# Patient Record
Sex: Female | Born: 1940
Health system: Southern US, Community
[De-identification: ages and names within clinical notes are randomized; demographics above are authoritative.]

## PROBLEM LIST (undated history)

## (undated) DIAGNOSIS — G894 Chronic pain syndrome: Secondary | ICD-10-CM

## (undated) DIAGNOSIS — F3289 Other specified depressive episodes: Secondary | ICD-10-CM

## (undated) DIAGNOSIS — M797 Fibromyalgia: Secondary | ICD-10-CM

## (undated) DIAGNOSIS — M810 Age-related osteoporosis without current pathological fracture: Secondary | ICD-10-CM

## (undated) DIAGNOSIS — Z86018 Personal history of other benign neoplasm: Secondary | ICD-10-CM

## (undated) DIAGNOSIS — O039 Complete or unspecified spontaneous abortion without complication: Secondary | ICD-10-CM

## (undated) DIAGNOSIS — R233 Spontaneous ecchymoses: Secondary | ICD-10-CM

## (undated) DIAGNOSIS — K219 Gastro-esophageal reflux disease without esophagitis: Secondary | ICD-10-CM

## (undated) DIAGNOSIS — E1142 Type 2 diabetes mellitus with diabetic polyneuropathy: Secondary | ICD-10-CM

## (undated) DIAGNOSIS — J449 Chronic obstructive pulmonary disease, unspecified: Secondary | ICD-10-CM

## (undated) DIAGNOSIS — G4700 Insomnia, unspecified: Secondary | ICD-10-CM

## (undated) DIAGNOSIS — E1159 Type 2 diabetes mellitus with other circulatory complications: Secondary | ICD-10-CM

## (undated) DIAGNOSIS — G610 Guillain-Barre syndrome: Secondary | ICD-10-CM

## (undated) DIAGNOSIS — M48061 Spinal stenosis, lumbar region without neurogenic claudication: Secondary | ICD-10-CM

## (undated) DIAGNOSIS — I10 Essential (primary) hypertension: Secondary | ICD-10-CM

## (undated) DIAGNOSIS — E039 Hypothyroidism, unspecified: Secondary | ICD-10-CM

## (undated) DIAGNOSIS — N3090 Cystitis, unspecified without hematuria: Secondary | ICD-10-CM

## (undated) DIAGNOSIS — K449 Diaphragmatic hernia without obstruction or gangrene: Secondary | ICD-10-CM

## (undated) DIAGNOSIS — J301 Allergic rhinitis due to pollen: Secondary | ICD-10-CM

## (undated) DIAGNOSIS — J42 Unspecified chronic bronchitis: Secondary | ICD-10-CM

## (undated) DIAGNOSIS — R269 Unspecified abnormalities of gait and mobility: Secondary | ICD-10-CM

## (undated) DIAGNOSIS — F329 Major depressive disorder, single episode, unspecified: Secondary | ICD-10-CM

## (undated) DIAGNOSIS — E119 Type 2 diabetes mellitus without complications: Secondary | ICD-10-CM

## (undated) DIAGNOSIS — M069 Rheumatoid arthritis, unspecified: Secondary | ICD-10-CM

## (undated) DIAGNOSIS — E782 Mixed hyperlipidemia: Secondary | ICD-10-CM

## (undated) DIAGNOSIS — J189 Pneumonia, unspecified organism: Secondary | ICD-10-CM

## (undated) DIAGNOSIS — E785 Hyperlipidemia, unspecified: Secondary | ICD-10-CM

## (undated) DIAGNOSIS — M47817 Spondylosis without myelopathy or radiculopathy, lumbosacral region: Secondary | ICD-10-CM

## (undated) DIAGNOSIS — R32 Unspecified urinary incontinence: Secondary | ICD-10-CM

## (undated) DIAGNOSIS — L299 Pruritus, unspecified: Secondary | ICD-10-CM

## (undated) HISTORY — DX: Unspecified chronic bronchitis: J42

## (undated) HISTORY — DX: Allergic rhinitis due to pollen: J30.1

## (undated) HISTORY — DX: Guillain-Barre syndrome: G61.0

## (undated) HISTORY — DX: Pruritus, unspecified: L29.9

## (undated) HISTORY — DX: Spontaneous ecchymoses: R23.3

## (undated) HISTORY — DX: Major depressive disorder, single episode, unspecified: F32.9

## (undated) HISTORY — DX: Other specified depressive episodes: F32.89

## (undated) HISTORY — DX: Insomnia, unspecified: G47.00

## (undated) HISTORY — DX: Personal history of other benign neoplasm: Z86.018

## (undated) HISTORY — DX: Unspecified urinary incontinence: R32

## (undated) HISTORY — PX: TONSILLECTOMY: SUR1361

## (undated) HISTORY — DX: Morbid (severe) obesity due to excess calories: E66.01

## (undated) HISTORY — DX: Spinal stenosis, lumbar region without neurogenic claudication: M48.061

## (undated) HISTORY — PX: APPENDECTOMY: SHX54

## (undated) HISTORY — DX: Complete or unspecified spontaneous abortion without complication: O03.9

## (undated) HISTORY — DX: Type 2 diabetes mellitus with diabetic polyneuropathy: E11.42

## (undated) HISTORY — PX: KNEE SURGERY: SHX244

## (undated) HISTORY — DX: Spondylosis without myelopathy or radiculopathy, lumbosacral region: M47.817

## (undated) HISTORY — DX: Essential (primary) hypertension: I10

## (undated) HISTORY — DX: Hypothyroidism, unspecified: E03.9

## (undated) HISTORY — DX: Type 2 diabetes mellitus without complications: E11.9

## (undated) HISTORY — DX: Chronic pain syndrome: G89.4

## (undated) HISTORY — DX: Age-related osteoporosis without current pathological fracture: M81.0

## (undated) HISTORY — DX: Rheumatoid arthritis, unspecified: M06.9

## (undated) HISTORY — DX: Hyperlipidemia, unspecified: E78.5

## (undated) HISTORY — DX: Gastro-esophageal reflux disease without esophagitis: K21.9

## (undated) HISTORY — PX: CATARACT EXTRACTION, BILATERAL: SHX1313

## (undated) HISTORY — DX: Cystitis, unspecified without hematuria: N30.90

## (undated) HISTORY — DX: Unspecified abnormalities of gait and mobility: R26.9

## (undated) HISTORY — PX: OTHER SURGICAL HISTORY: SHX169

## (undated) HISTORY — DX: Fibromyalgia: M79.7

## (undated) HISTORY — DX: Diaphragmatic hernia without obstruction or gangrene: K44.9

## (undated) HISTORY — DX: Type 2 diabetes mellitus with other circulatory complications: E11.59

## (undated) HISTORY — DX: Mixed hyperlipidemia: E78.2

---

## 1960-08-22 DIAGNOSIS — O039 Complete or unspecified spontaneous abortion without complication: Secondary | ICD-10-CM

## 1960-08-22 HISTORY — DX: Complete or unspecified spontaneous abortion without complication: O03.9

## 1960-08-22 HISTORY — PX: OTHER SURGICAL HISTORY: SHX169

## 1966-08-22 HISTORY — PX: OVARIAN CYST SURGERY: SHX726

## 1972-08-22 HISTORY — PX: ABDOMINAL HYSTERECTOMY: SHX81

## 1980-08-22 HISTORY — PX: BACK SURGERY: SHX140

## 1982-08-22 HISTORY — PX: CHOLECYSTECTOMY: SHX55

## 1999-06-06 ENCOUNTER — Emergency Department (HOSPITAL_COMMUNITY): Admission: EM | Admit: 1999-06-06 | Discharge: 1999-06-07 | Payer: Self-pay

## 1999-08-23 HISTORY — PX: BRONCHOSCOPY: SUR163

## 2000-06-16 ENCOUNTER — Encounter: Admission: RE | Admit: 2000-06-16 | Discharge: 2000-06-16 | Payer: Self-pay | Admitting: Family Medicine

## 2000-06-16 ENCOUNTER — Encounter: Payer: Self-pay | Admitting: Family Medicine

## 2000-06-20 ENCOUNTER — Encounter: Admission: RE | Admit: 2000-06-20 | Discharge: 2000-06-20 | Payer: Self-pay | Admitting: Family Medicine

## 2000-06-20 ENCOUNTER — Encounter: Payer: Self-pay | Admitting: Family Medicine

## 2000-06-29 ENCOUNTER — Encounter: Payer: Self-pay | Admitting: Emergency Medicine

## 2000-06-29 ENCOUNTER — Encounter (INDEPENDENT_AMBULATORY_CARE_PROVIDER_SITE_OTHER): Payer: Self-pay | Admitting: *Deleted

## 2000-06-29 ENCOUNTER — Encounter (INDEPENDENT_AMBULATORY_CARE_PROVIDER_SITE_OTHER): Payer: Self-pay | Admitting: Specialist

## 2000-06-29 ENCOUNTER — Encounter: Payer: Self-pay | Admitting: Internal Medicine

## 2000-06-29 ENCOUNTER — Inpatient Hospital Stay (HOSPITAL_COMMUNITY): Admission: EM | Admit: 2000-06-29 | Discharge: 2000-07-08 | Payer: Self-pay | Admitting: Emergency Medicine

## 2000-06-30 ENCOUNTER — Encounter: Payer: Self-pay | Admitting: Internal Medicine

## 2000-06-30 ENCOUNTER — Encounter: Payer: Self-pay | Admitting: Thoracic Surgery (Cardiothoracic Vascular Surgery)

## 2000-07-01 ENCOUNTER — Encounter: Payer: Self-pay | Admitting: Family Medicine

## 2000-07-02 ENCOUNTER — Encounter: Payer: Self-pay | Admitting: Internal Medicine

## 2000-07-03 ENCOUNTER — Encounter: Payer: Self-pay | Admitting: Family Medicine

## 2000-07-04 ENCOUNTER — Encounter: Payer: Self-pay | Admitting: *Deleted

## 2000-07-04 ENCOUNTER — Encounter: Payer: Self-pay | Admitting: Thoracic Surgery (Cardiothoracic Vascular Surgery)

## 2000-07-19 ENCOUNTER — Encounter: Payer: Self-pay | Admitting: Neurology

## 2000-07-19 ENCOUNTER — Inpatient Hospital Stay (HOSPITAL_COMMUNITY): Admission: AD | Admit: 2000-07-19 | Discharge: 2000-07-27 | Payer: Self-pay | Admitting: Neurology

## 2000-07-25 ENCOUNTER — Encounter: Payer: Self-pay | Admitting: Neurology

## 2000-07-27 ENCOUNTER — Inpatient Hospital Stay (HOSPITAL_COMMUNITY)
Admission: RE | Admit: 2000-07-27 | Discharge: 2000-08-02 | Payer: Self-pay | Admitting: Physical Medicine and Rehabilitation

## 2000-08-10 ENCOUNTER — Encounter
Admission: RE | Admit: 2000-08-10 | Discharge: 2000-11-08 | Payer: Self-pay | Admitting: Physical Medicine and Rehabilitation

## 2000-08-22 DIAGNOSIS — G610 Guillain-Barre syndrome: Secondary | ICD-10-CM

## 2000-08-22 HISTORY — PX: OTHER SURGICAL HISTORY: SHX169

## 2000-08-22 HISTORY — DX: Guillain-Barre syndrome: G61.0

## 2000-08-23 ENCOUNTER — Encounter: Admission: RE | Admit: 2000-08-23 | Discharge: 2000-08-23 | Payer: Self-pay | Admitting: Family Medicine

## 2000-08-23 ENCOUNTER — Encounter: Payer: Self-pay | Admitting: Family Medicine

## 2000-08-28 ENCOUNTER — Encounter: Admission: RE | Admit: 2000-08-28 | Discharge: 2000-08-28 | Payer: Self-pay | Admitting: Family Medicine

## 2000-08-28 ENCOUNTER — Encounter: Payer: Self-pay | Admitting: Family Medicine

## 2000-09-19 ENCOUNTER — Encounter (INDEPENDENT_AMBULATORY_CARE_PROVIDER_SITE_OTHER): Payer: Self-pay

## 2000-09-19 ENCOUNTER — Inpatient Hospital Stay (HOSPITAL_COMMUNITY): Admission: RE | Admit: 2000-09-19 | Discharge: 2000-09-22 | Payer: Self-pay | Admitting: *Deleted

## 2000-10-20 HISTORY — PX: OTHER SURGICAL HISTORY: SHX169

## 2000-11-09 ENCOUNTER — Encounter: Payer: Self-pay | Admitting: Thoracic Surgery (Cardiothoracic Vascular Surgery)

## 2000-11-13 ENCOUNTER — Inpatient Hospital Stay (HOSPITAL_COMMUNITY)
Admission: RE | Admit: 2000-11-13 | Discharge: 2000-11-17 | Payer: Self-pay | Admitting: Thoracic Surgery (Cardiothoracic Vascular Surgery)

## 2000-11-13 ENCOUNTER — Encounter (INDEPENDENT_AMBULATORY_CARE_PROVIDER_SITE_OTHER): Payer: Self-pay | Admitting: Specialist

## 2000-11-13 ENCOUNTER — Encounter: Payer: Self-pay | Admitting: Thoracic Surgery (Cardiothoracic Vascular Surgery)

## 2000-11-14 ENCOUNTER — Encounter: Payer: Self-pay | Admitting: Thoracic Surgery (Cardiothoracic Vascular Surgery)

## 2000-11-14 ENCOUNTER — Encounter: Payer: Self-pay | Admitting: Thoracic Surgery

## 2000-11-16 ENCOUNTER — Encounter: Payer: Self-pay | Admitting: Thoracic Surgery (Cardiothoracic Vascular Surgery)

## 2000-11-17 ENCOUNTER — Encounter: Payer: Self-pay | Admitting: Thoracic Surgery (Cardiothoracic Vascular Surgery)

## 2000-11-29 ENCOUNTER — Encounter: Payer: Self-pay | Admitting: Thoracic Surgery (Cardiothoracic Vascular Surgery)

## 2000-11-29 ENCOUNTER — Encounter
Admission: RE | Admit: 2000-11-29 | Discharge: 2000-11-29 | Payer: Self-pay | Admitting: Thoracic Surgery (Cardiothoracic Vascular Surgery)

## 2001-08-27 ENCOUNTER — Encounter: Admission: RE | Admit: 2001-08-27 | Discharge: 2001-11-25 | Payer: Self-pay | Admitting: Family Medicine

## 2002-05-13 ENCOUNTER — Encounter: Payer: Self-pay | Admitting: Family Medicine

## 2002-05-13 ENCOUNTER — Encounter: Admission: RE | Admit: 2002-05-13 | Discharge: 2002-05-13 | Payer: Self-pay | Admitting: Family Medicine

## 2004-03-11 ENCOUNTER — Encounter: Admission: RE | Admit: 2004-03-11 | Discharge: 2004-03-11 | Payer: Self-pay | Admitting: Family Medicine

## 2004-05-24 ENCOUNTER — Encounter
Admission: RE | Admit: 2004-05-24 | Discharge: 2004-05-24 | Payer: Self-pay | Admitting: Thoracic Surgery (Cardiothoracic Vascular Surgery)

## 2004-06-22 ENCOUNTER — Ambulatory Visit (HOSPITAL_BASED_OUTPATIENT_CLINIC_OR_DEPARTMENT_OTHER): Admission: RE | Admit: 2004-06-22 | Discharge: 2004-06-22 | Payer: Self-pay | Admitting: Family Medicine

## 2004-06-28 ENCOUNTER — Ambulatory Visit: Payer: Self-pay

## 2004-08-10 ENCOUNTER — Encounter: Admission: RE | Admit: 2004-08-10 | Discharge: 2004-08-10 | Payer: Self-pay | Admitting: Family Medicine

## 2004-11-03 ENCOUNTER — Encounter: Admission: RE | Admit: 2004-11-03 | Discharge: 2004-11-03 | Payer: Self-pay | Admitting: Family Medicine

## 2004-12-15 ENCOUNTER — Ambulatory Visit: Payer: Self-pay | Admitting: Internal Medicine

## 2004-12-17 ENCOUNTER — Ambulatory Visit (HOSPITAL_COMMUNITY): Admission: RE | Admit: 2004-12-17 | Discharge: 2004-12-17 | Payer: Self-pay | Admitting: Internal Medicine

## 2004-12-17 ENCOUNTER — Ambulatory Visit: Payer: Self-pay | Admitting: Internal Medicine

## 2004-12-17 ENCOUNTER — Encounter (INDEPENDENT_AMBULATORY_CARE_PROVIDER_SITE_OTHER): Payer: Self-pay | Admitting: Specialist

## 2005-01-21 ENCOUNTER — Encounter
Admission: RE | Admit: 2005-01-21 | Discharge: 2005-01-21 | Payer: Self-pay | Admitting: Thoracic Surgery (Cardiothoracic Vascular Surgery)

## 2005-08-24 ENCOUNTER — Encounter: Admission: RE | Admit: 2005-08-24 | Discharge: 2005-08-24 | Payer: Self-pay | Admitting: Family Medicine

## 2005-11-22 ENCOUNTER — Encounter: Admission: RE | Admit: 2005-11-22 | Discharge: 2005-11-22 | Payer: Self-pay | Admitting: Family Medicine

## 2006-02-16 ENCOUNTER — Encounter
Admission: RE | Admit: 2006-02-16 | Discharge: 2006-02-16 | Payer: Self-pay | Admitting: Thoracic Surgery (Cardiothoracic Vascular Surgery)

## 2006-02-23 ENCOUNTER — Encounter: Admission: RE | Admit: 2006-02-23 | Discharge: 2006-02-23 | Payer: Self-pay | Admitting: Family Medicine

## 2006-04-17 ENCOUNTER — Encounter: Admission: RE | Admit: 2006-04-17 | Discharge: 2006-04-17 | Payer: Self-pay | Admitting: Otolaryngology

## 2006-07-12 ENCOUNTER — Ambulatory Visit: Payer: Self-pay | Admitting: Cardiology

## 2006-07-14 ENCOUNTER — Ambulatory Visit: Payer: Self-pay

## 2006-07-17 ENCOUNTER — Ambulatory Visit: Payer: Self-pay

## 2008-04-17 ENCOUNTER — Encounter: Admission: RE | Admit: 2008-04-17 | Discharge: 2008-04-17 | Payer: Self-pay | Admitting: Family Medicine

## 2009-07-29 ENCOUNTER — Encounter: Admission: RE | Admit: 2009-07-29 | Discharge: 2009-07-29 | Payer: Self-pay | Admitting: Family Medicine

## 2009-10-02 ENCOUNTER — Encounter: Admission: RE | Admit: 2009-10-02 | Discharge: 2009-10-02 | Payer: Self-pay | Admitting: Family Medicine

## 2009-10-05 ENCOUNTER — Ambulatory Visit: Payer: Self-pay | Admitting: Oncology

## 2009-10-30 LAB — CBC WITH DIFFERENTIAL/PLATELET
BASO%: 0.6 % (ref 0.0–2.0)
Basophils Absolute: 0.1 10*3/uL (ref 0.0–0.1)
EOS%: 2 % (ref 0.0–7.0)
Eosinophils Absolute: 0.3 10*3/uL (ref 0.0–0.5)
HCT: 42.5 % (ref 34.8–46.6)
HGB: 14.2 g/dL (ref 11.6–15.9)
LYMPH%: 21 % (ref 14.0–49.7)
MCH: 29.2 pg (ref 25.1–34.0)
MCHC: 33.4 g/dL (ref 31.5–36.0)
MCV: 87.3 fL (ref 79.5–101.0)
MONO#: 1.4 10*3/uL — ABNORMAL HIGH (ref 0.1–0.9)
MONO%: 9.7 % (ref 0.0–14.0)
NEUT#: 9.4 10*3/uL — ABNORMAL HIGH (ref 1.5–6.5)
NEUT%: 66.7 % (ref 38.4–76.8)
Platelets: 322 10*3/uL (ref 145–400)
RBC: 4.87 10*6/uL (ref 3.70–5.45)
RDW: 14.3 % (ref 11.2–14.5)
WBC: 14.1 10*3/uL — ABNORMAL HIGH (ref 3.9–10.3)
lymph#: 3 10*3/uL (ref 0.9–3.3)

## 2009-10-30 LAB — CHCC SMEAR

## 2010-01-27 ENCOUNTER — Encounter: Admission: RE | Admit: 2010-01-27 | Discharge: 2010-01-27 | Payer: Self-pay | Admitting: Family Medicine

## 2010-04-05 ENCOUNTER — Encounter: Payer: Self-pay | Admitting: Cardiology

## 2010-04-29 ENCOUNTER — Ambulatory Visit: Payer: Self-pay | Admitting: Oncology

## 2010-05-03 DIAGNOSIS — J309 Allergic rhinitis, unspecified: Secondary | ICD-10-CM | POA: Insufficient documentation

## 2010-05-03 DIAGNOSIS — E785 Hyperlipidemia, unspecified: Secondary | ICD-10-CM | POA: Insufficient documentation

## 2010-05-03 DIAGNOSIS — Z8669 Personal history of other diseases of the nervous system and sense organs: Secondary | ICD-10-CM | POA: Insufficient documentation

## 2010-05-03 DIAGNOSIS — B379 Candidiasis, unspecified: Secondary | ICD-10-CM | POA: Insufficient documentation

## 2010-05-03 DIAGNOSIS — M797 Fibromyalgia: Secondary | ICD-10-CM | POA: Insufficient documentation

## 2010-05-03 DIAGNOSIS — K219 Gastro-esophageal reflux disease without esophagitis: Secondary | ICD-10-CM | POA: Insufficient documentation

## 2010-05-03 DIAGNOSIS — I10 Essential (primary) hypertension: Secondary | ICD-10-CM | POA: Insufficient documentation

## 2010-05-03 DIAGNOSIS — G610 Guillain-Barre syndrome: Secondary | ICD-10-CM | POA: Insufficient documentation

## 2010-05-03 DIAGNOSIS — F3289 Other specified depressive episodes: Secondary | ICD-10-CM | POA: Insufficient documentation

## 2010-05-03 DIAGNOSIS — J189 Pneumonia, unspecified organism: Secondary | ICD-10-CM | POA: Insufficient documentation

## 2010-05-03 DIAGNOSIS — E119 Type 2 diabetes mellitus without complications: Secondary | ICD-10-CM | POA: Insufficient documentation

## 2010-05-03 DIAGNOSIS — R0602 Shortness of breath: Secondary | ICD-10-CM | POA: Insufficient documentation

## 2010-05-03 DIAGNOSIS — J449 Chronic obstructive pulmonary disease, unspecified: Secondary | ICD-10-CM | POA: Insufficient documentation

## 2010-05-03 DIAGNOSIS — R079 Chest pain, unspecified: Secondary | ICD-10-CM | POA: Insufficient documentation

## 2010-05-03 DIAGNOSIS — F329 Major depressive disorder, single episode, unspecified: Secondary | ICD-10-CM

## 2010-05-03 DIAGNOSIS — F339 Major depressive disorder, recurrent, unspecified: Secondary | ICD-10-CM | POA: Insufficient documentation

## 2010-05-04 ENCOUNTER — Ambulatory Visit: Payer: Self-pay | Admitting: Cardiology

## 2010-05-04 DIAGNOSIS — R609 Edema, unspecified: Secondary | ICD-10-CM | POA: Insufficient documentation

## 2010-05-06 LAB — CBC WITH DIFFERENTIAL/PLATELET
BASO%: 0.5 % (ref 0.0–2.0)
Basophils Absolute: 0.1 10*3/uL (ref 0.0–0.1)
EOS%: 3 % (ref 0.0–7.0)
Eosinophils Absolute: 0.4 10*3/uL (ref 0.0–0.5)
HCT: 42.1 % (ref 34.8–46.6)
HGB: 13.4 g/dL (ref 11.6–15.9)
LYMPH%: 16.9 % (ref 14.0–49.7)
MCH: 27.7 pg (ref 25.1–34.0)
MCHC: 31.8 g/dL (ref 31.5–36.0)
MCV: 87.2 fL (ref 79.5–101.0)
MONO#: 0.9 10*3/uL (ref 0.1–0.9)
MONO%: 7.9 % (ref 0.0–14.0)
NEUT#: 8.5 10*3/uL — ABNORMAL HIGH (ref 1.5–6.5)
NEUT%: 71.7 % (ref 38.4–76.8)
Platelets: 266 10*3/uL (ref 145–400)
RBC: 4.83 10*6/uL (ref 3.70–5.45)
RDW: 14.3 % (ref 11.2–14.5)
WBC: 11.8 10*3/uL — ABNORMAL HIGH (ref 3.9–10.3)
lymph#: 2 10*3/uL (ref 0.9–3.3)

## 2010-05-13 ENCOUNTER — Telehealth (INDEPENDENT_AMBULATORY_CARE_PROVIDER_SITE_OTHER): Payer: Self-pay | Admitting: *Deleted

## 2010-05-17 ENCOUNTER — Telehealth (INDEPENDENT_AMBULATORY_CARE_PROVIDER_SITE_OTHER): Payer: Self-pay | Admitting: *Deleted

## 2010-05-18 ENCOUNTER — Ambulatory Visit: Payer: Self-pay

## 2010-05-18 ENCOUNTER — Ambulatory Visit: Payer: Self-pay | Admitting: Cardiology

## 2010-05-18 ENCOUNTER — Encounter: Payer: Self-pay | Admitting: Internal Medicine

## 2010-05-18 ENCOUNTER — Encounter: Payer: Self-pay | Admitting: Cardiology

## 2010-05-18 ENCOUNTER — Ambulatory Visit (HOSPITAL_COMMUNITY): Admission: RE | Admit: 2010-05-18 | Discharge: 2010-05-18 | Payer: Self-pay | Admitting: Cardiology

## 2010-05-18 ENCOUNTER — Encounter (HOSPITAL_COMMUNITY)
Admission: RE | Admit: 2010-05-18 | Discharge: 2010-07-26 | Payer: Self-pay | Source: Home / Self Care | Admitting: Cardiology

## 2010-05-20 ENCOUNTER — Ambulatory Visit: Payer: Self-pay

## 2010-06-01 ENCOUNTER — Encounter (INDEPENDENT_AMBULATORY_CARE_PROVIDER_SITE_OTHER): Payer: Self-pay | Admitting: *Deleted

## 2010-06-04 ENCOUNTER — Encounter: Admission: RE | Admit: 2010-06-04 | Discharge: 2010-06-04 | Payer: Self-pay | Admitting: Family Medicine

## 2010-06-04 ENCOUNTER — Ambulatory Visit (HOSPITAL_COMMUNITY): Admission: RE | Admit: 2010-06-04 | Discharge: 2010-06-04 | Payer: Self-pay | Admitting: Rheumatology

## 2010-06-20 ENCOUNTER — Encounter: Admission: RE | Admit: 2010-06-20 | Discharge: 2010-06-20 | Payer: Self-pay | Admitting: Specialist

## 2010-09-21 NOTE — Assessment & Plan Note (Signed)
Summary: np6/dx: dypsnea upon excertion/lg   History of Present Illness: 70 year old female her evaluation of dyspnea, chest tightness and edema. Previous Myoview in November 2007 showed an ejection fraction of 81% and no ischemia or infarction. The patient has a long history of dyspnea on exertion. It has worsened recently. There is orthopnea which is chronic as well as pedal edema. The edema is new over the past year. It is relatively well controlled with diuretics. She also has chest tightness with exertion. This has also been somewhat chronic. It increases with deep inspiration. The cause of her dyspnea, chest tightness and edema we were asked to further evaluate.  Current Medications (verified): 1)  Novolog 100 Unit/ml Soln (Insulin Aspart) .Marland Kitchen.. 10 Untis Every Meal 2)  Lantus 100 Unit/ml Soln (Insulin Glargine) .... 70 Units Qhs 3)  Actos 45 Mg Tabs (Pioglitazone Hcl) .Marland Kitchen.. 1 Tab By Mouth Once Daily 4)  Lisinopril 40 Mg Tabs (Lisinopril) .... Take One Tablet By Mouth Daily 5)  Furosemide 40 Mg Tabs (Furosemide) .... Take One Tablet By Mouth Daily. 6)  Potassium Chloride Cr 10 Meq Cr-Caps (Potassium Chloride) .... 2 Tabs By Mouth Once Daily 7)  Amitriptyline Hcl 25 Mg Tabs (Amitriptyline Hcl) .Marland Kitchen.. 1 Tab By Mouth Once Daily 8)  Levoxyl 137 Mcg Tabs (Levothyroxine Sodium) .Marland Kitchen.. 1  Tab By Mouth Once Daily 9)  Triamterene-Hctz 75-50 Mg Tabs (Triamterene-Hctz) .... 1/2 Tab By Mouth Once Daily 10)  Clonidine Hcl 0.1 Mg Tabs (Clonidine Hcl) .Marland Kitchen.. 1 Tab By Mouth Once Daily 11)  Hydrocodone-Acetaminophen 10-500 Mg Tabs (Hydrocodone-Acetaminophen) .... As Directed As Needed 12)  Crestor 10 Mg Tabs (Rosuvastatin Calcium) .... Take One Tablet By Mouth Daily. 13)  Metformin Hcl 1000 Mg Tabs (Metformin Hcl) .Marland Kitchen.. 1 Tab By Mouth  Two Times A Day 14)  Vitamin D3 5000 Unit Tabs (Cholecalciferol) .Marland Kitchen.. 1 Tab By Mouth Once Daily 15)  Gabapentin 300 Mg Caps (Gabapentin) .Marland Kitchen.. 1 Tab By Mouth Three Times A Day 16)   Spiriva Handihaler 18 Mcg Caps (Tiotropium Bromide Monohydrate) .... As Directed 17)  Qvar 80 Mcg/act Aers (Beclomethasone Dipropionate) .... As  Directed 18)  Voltaren 0.1 % Soln (Diclofenac Sodium) .... As Directed 19)  Doxycycline Hyclate 100 Mg Caps (Doxycycline Hyclate) .Marland Kitchen.. 1 Tab By Mouth Once Daily  Past History:  Past Medical History: HYPERLIPIDEMIA  HYPERTENSION  FIBROMYALGIA  DEPRESSION COPD ALLERGIC RHINITIS GERD  OBESITY  GUILLAIN-BARRE SYNDROME  DIABETES MELLITUS Rheumatoid arthritis Hypothyroid  Past Surgical History:  1. Ovarian cyst resected.  2. Hysterectomy.  3. Back surgery from herniated disc.  4. Cholecystectomy.  5. Abdominal tumors and ovary resected.  6. Thymus surgery.  7. Appendectomy  8. Tonsillectomy  Family History: Reviewed history from 05/03/2010 and no changes required. Father with CHF. No premature CAD.  Social History: Reviewed history from 05/03/2010 and no changes required. The patient is a widow.  She has two adult sons.  She is retired.   Smoked previously.  Review of Systems       Significant arthralgias but no fevers or chills, productive cough, hemoptysis, dysphasia, odynophagia, melena, hematochezia, dysuria, hematuria, rash, seizure activity,  PND,  claudication. Remaining systems are negative.   Vital Signs:  Patient profile:   70 year old female Height:      66 inches Weight:      307 pounds BMI:     49.73 Pulse rate:   101 / minute Resp:     14 per minute BP sitting:   141 / 71  (  left arm)  Vitals Entered By: Kem Parkinson (May 04, 2010 11:13 AM)  Physical Exam  General:  Well developed/morbidly obese in NAD Skin warm/dry Patient not depressed No peripheral clubbing Back-normal HEENT-normal/normal eyelids Neck supple/normal carotid upstroke bilaterally; no bruits; no JVD; no thyromegaly chest - CTA/ normal expansion CV - RRR/normal S1 and S2; no murmurs, rubs or gallops;  PMI  nondisplaced Abdomen -NT/ND, no HSM, no mass, + bowel sounds, no bruit 2+ femoral pulses, no bruits Ext-no edema, chords, 2+ DP Neuro-grossly nonfocal     EKG  Procedure date:  05/04/2010  Findings:      Sinus tachycardia at a rate of 101. No ST changes.  Impression & Recommendations:  Problem # 1:  DYSPNEA (ICD-786.05) Etiology unclear. Schedule echocardiogram to evaluate LV function. Scheduled Myoview to exclude ischemia. Certainly there could be a component of obesity hypoventilation syndrome and deconditioning. Her updated medication list for this problem includes:    Lisinopril 40 Mg Tabs (Lisinopril) .Marland Kitchen... Take one tablet by mouth daily    Furosemide 40 Mg Tabs (Furosemide) .Marland Kitchen... Take one tablet by mouth daily.    Triamterene-hctz 75-50 Mg Tabs (Triamterene-hctz) .Marland Kitchen... 1/2 tab by mouth once daily  Orders: Echocardiogram (Echo)  Problem # 2:  HYPERLIPIDEMIA (ICD-272.4) Continue statin. Lipids and liver monitored by primary care. Her updated medication list for this problem includes:    Crestor 10 Mg Tabs (Rosuvastatin calcium) .Marland Kitchen... Take one tablet by mouth daily.  Problem # 3:  CHEST PAIN (ICD-786.50) Multiple risk factors. Schedule Myoview for risk stratification. Her updated medication list for this problem includes:    Lisinopril 40 Mg Tabs (Lisinopril) .Marland Kitchen... Take one tablet by mouth daily  Orders: Nuclear Stress Test (Nuc Stress Test)  Problem # 4:  HYPERTENSION (ICD-401.9) Continue blood pressure medications. Potassium and renal function monitored by primary care. Her updated medication list for this problem includes:    Lisinopril 40 Mg Tabs (Lisinopril) .Marland Kitchen... Take one tablet by mouth daily    Furosemide 40 Mg Tabs (Furosemide) .Marland Kitchen... Take one tablet by mouth daily.    Triamterene-hctz 75-50 Mg Tabs (Triamterene-hctz) .Marland Kitchen... 1/2 tab by mouth once daily    Clonidine Hcl 0.1 Mg Tabs (Clonidine hcl) .Marland Kitchen... 1 tab by mouth once daily  Problem # 5:  OBESITY  (ICD-278.00) Long discussion concerning the importance of weight loss.  Problem # 6:  DIABETES MELLITUS (ICD-250.00)  Her updated medication list for this problem includes:    Novolog 100 Unit/ml Soln (Insulin aspart) .Marland KitchenMarland KitchenMarland KitchenMarland Kitchen 10 untis every meal    Lantus 100 Unit/ml Soln (Insulin glargine) .Marland KitchenMarland KitchenMarland KitchenMarland Kitchen 70 units qhs    Actos 45 Mg Tabs (Pioglitazone hcl) .Marland Kitchen... 1 tab by mouth once daily    Lisinopril 40 Mg Tabs (Lisinopril) .Marland Kitchen... Take one tablet by mouth daily    Metformin Hcl 1000 Mg Tabs (Metformin hcl) .Marland Kitchen... 1 tab by mouth  two times a day  Problem # 7:  EDEMA (ICD-782.3) No edema on examination today. Continue diuretics. Await echocardiogram results. She certainly could have RV dysfunction related to pulmonary hypertension from her obesity.  Patient Instructions: 1)  Your physician recommends that you schedule a follow-up appointment in: AS NEEDED PENDING TEST RESULTS 2)  Your physician has requested that you have an echocardiogram.  Echocardiography is a painless test that uses sound waves to create images of your heart. It provides your doctor with information about the size and shape of your heart and how well your heart's chambers and valves are working.  This procedure  takes approximately one hour. There are no restrictions for this procedure. 3)  Your physician has requested that you have an LEXISCAN myoview.  For further information please visit [email or web address]  Please follow instruction sheet, as given.

## 2010-09-21 NOTE — Progress Notes (Signed)
  Phone Note From Other Clinic   Caller: Melissa/Dr.Stone Initial call taken by: KM    Faxed LOV,12 lead over to [phone] [facility]  May 13, 2010 8:43 AM

## 2010-09-21 NOTE — Assessment & Plan Note (Signed)
Summary: Cardiology Nuclear Testing  Nuclear Med Background Indications for Stress Test: Evaluation for Ischemia   History: COPD, Myocardial Perfusion Study  History Comments: 07 MPS-NL with no ischemia  Symptoms: Chest Pain, Chest Tightness with Exertion, Diaphoresis, DOE, Fatigue, Palpitations, SOB    Nuclear Pre-Procedure Cardiac Risk Factors: Hypertension, IDDM Type 2, Lipids Caffeine/Decaff Intake: None NPO After: 11:30 PM Lungs: clear IV 0.9% NS with Angio Cath: 22g     IV Site: R Antecubital IV Started by: Irean Hong, RN Chest Size (in) 44     Cup Size DDD     Height (in): 66 Weight (lb): 307 BMI: 49.73  Nuclear Med Study 1 or 2 day study:  2 day     Stress Test Type:  Treadmill/Lexiscan Reading MD:  Olga Millers, MD     Referring MD:  B.Gaege Sangalang Resting Radionuclide:  Technetium 16m Tetrofosmin     Resting Radionuclide Dose:  33 mCi  Stress Radionuclide:  Technetium 62m Tetrofosmin     Stress Radionuclide Dose:  33 mCi   Stress Protocol      Max HR:  148 bpm     Predicted Max HR:  151 bpm  Max Systolic BP: 162 mm Hg     Percent Max HR:  98.01 %Rate Pressure Product:  16109    Stress Test Technologist:  Frederick Peers, EMT-P     Nuclear Technologist:  Domenic Polite, CNMT  Rest Procedure  Myocardial perfusion imaging was performed at rest 45 minutes following the intravenous administration of Technetium 41m Tetrofosmin.  Stress Procedure  The patient received IV Lexiscan 0.4 mg over 15-seconds with concurrent low level exercise and then Technetium 56m Tetrofosmin was injected at 30-seconds while the patient continued walking one more minute.  There were no significant changes with Lexiscan./occ pvc's beginning of recovery stage, resolved within the next min.  Quantitative spect images were obtained after a 45 minute delay.  QPS Raw Data Images:  Acquisition technically good; normal left ventricular size. Stress Images:  Normal homogeneous uptake in all areas  of the myocardium. Rest Images:  Normal homogeneous uptake in all areas of the myocardium. Subtraction (SDS):  No evidence of ischemia. Transient Ischemic Dilatation:  .87  (Normal <1.22)  Lung/Heart Ratio:  .35  (Normal <0.45)  Quantitative Gated Spect Images QGS EDV:  70 ml QGS ESV:  15 ml QGS EF:  79 % QGS cine images:  Normal wall motion.   Overall Impression  Exercise Capacity: Lexiscan with no exercise. BP Response: Normal blood pressure response. Clinical Symptoms: No chest pain ECG Impression: No significant ST segment change suggestive of ischemia. Overall Impression: Normal lexiscan nuclear study with no ischemia or infarction.  Appended Document: Cardiology Nuclear Testing pt aware of results    Appended Document: Cardiology Nuclear Testing ok  Appended Document: Cardiology Nuclear Testing lmtcb ./cy  Appended Document: Cardiology Nuclear Testing LMTCB./CY  Appended Document: Cardiology Nuclear Testing number disconnected, letter of results sent to pt

## 2010-09-21 NOTE — Letter (Signed)
Summary: Generic Letter  Architectural technologist, Main Office  [address]   [place], [place] [address]   Phone: [phone]  Fax: [phone]        June 01, 2010 MRN: [id number]    [facility] [place] [address] [place], [place]  [address]    Dear Ms. Gentry Roch,          The stress test you had in our office has come back normal. That means the blood flow into the heart is normal and your heart pumping function is strong. Please call with questions or concerns.      Sincerely,  Deliah Goody, RN/Dr Olga Millers

## 2010-09-21 NOTE — Letter (Signed)
Summary: Family Medicine - Med List  Family Medicine - Med List   Imported By: Marylou Mccoy 05/18/2010 12:48:14  _____________________________________________________________________  External Attachment:    Type:   Image     Comment:   External Document

## 2010-09-21 NOTE — Letter (Signed)
Summary: Family Medicine  Family Medicine   Imported By: Marylou Mccoy 05/18/2010 12:47:41  _____________________________________________________________________  External Attachment:    Type:   Image     Comment:   External Document

## 2010-09-21 NOTE — Progress Notes (Signed)
Summary: nuc pre procedure  Phone Note Outgoing Call Call back at Home Phone [phone]   Call placed by: Cathlyn Parsons RN,  May 17, 2010 3:53 PM Call placed to: Patient Reason for Call: Confirm/change Appt Summary of Call: Left message with information on Myoview Information Sheet (see scanned document for details).      Nuclear Med Background Indications for Stress Test: Evaluation for Ischemia   History: COPD, Myocardial Perfusion Study  History Comments: 07 MPS-NL with no ischemia  Symptoms: Chest Pain, Chest Tightness with Exertion, DOE, SOB    Nuclear Pre-Procedure Cardiac Risk Factors: Hypertension, IDDM Type 2, Lipids Height (in): 66

## 2011-01-07 NOTE — Discharge Summary (Signed)
[place] [place]  Patient:    Katherine Delgado, Katherine Delgado                        MRN: [id number] Adm. Date:  [id number] Disc. Date: [id number] Attending:  Charlett Lango Dictator:   Maxwell Marion, RNFA CC:         Talmadge Coventry, M.D.  Charlaine Dalton. Sherene Sires, M.D. [facility]  Jillyn Hidden B. Truett Perna, M.D.  Kelli Hope, M.D.   Discharge Summary  DATE OF BIRTH:  01/10/1941  ADMISSION DIAGNOSIS:  Thymoma.  PAST MEDICAL HISTORY:  1. Hypertension.  2. Diabetes mellitus.  3. History of Guillain-Barre syndrome.  4. Status post IVIG treatment.  5. Gastroesophageal reflux disease symptoms.  6. Allergic rhinitis.  7. Recent history of pneumonia.  8. Diabetic neuropathy.  9. History of leukocytosis with fever of unknown origin in November 2001. 10. Chronic obstructive pulmonary disease. 11. Depression. 12. Fibromyalgia. 13. Obesity.  PAST SURGICAL HISTORY: 1. Cholecystectomy in 1984. 2. Back surgery in 1982. 3. Hysterectomy in 1974. 4. Flexible bronchoscopy and mediastinoscopy in November 2001. 5. Excision of cystic pelvic mass in January 2002.  ALLERGIES:  PENICILLIN, BACTRIM.  DISCHARGE DIAGNOSES: 1. 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 2. Postoperative hypokalemia, resolved.  HOSPITAL COURSE AND PROCEDURES:  Katherine Delgado is a pleasant 70 year old white female referred to Dr. Dorris Fetch by Dr. Smith Mince for evaluation of thymoma. Around November 2001, she began experiencing cough and right-sided chest pain. A chest x-ray reveals possible lung mass versus pneumonia.  A CT of the chest revealed mediastinal lymphadenopathy and possible anterior mediastinal mass. Dr. Dorris Fetch performed a bronchoscopy and mediastinoscopy.  Mediastinoscopy revealed enlarged level 7 lymph nodes and interior cervical nodes.  The lymph nodes were negative, showing only hyperplasia.  The interior mediastinal mass was finally tissue with hyperplasia versus thymoma.   She was subsequently readmitted to the hospital with Guillain-Barre syndrome, from which she has now recovered.  She was scheduled for resection of thymoma after recuperating from this.  Unfortunately, a new CT scan of the chest and abdomen in January revealed a 7 cm cystic pelvic mass.  She underwent surgery to remove this at [facility] in January 2002.  She has recovered from this surgery, and was re-evaluated by Dr. Dorris Fetch in his office, and decision was made to proceed with resection of the thymic mass.  On November 13, 2000, Katherine Delgado was electively admitted to [facility] and Dr. Edwina Barth, and underwent an uncomplicated partial mediansternotomy and thymectomy. Preliminary pathology at the time of surgery revealed thymic hyperplasia.  At the conclusion of the procedure she was transferred in stable condition to the PACU.  Katherine Delgado postoperative course has been uneventful.  Final pathology has been returned as benign thymic hyperplasia with benign thymic cyst.  Katherine Delgado is making very good progress towards discharge, and Dr. Dorris Fetch anticipates her ready to be discharged home tomorrow, November 17, 2000.  CONDITION ON DISCHARGE:  Improved.  DISCHARGE INSTRUCTIONS:  Medications, diet, activity, wound care, and followup.  Please see discharge instruction sheet for details.  DISCHARGE MEDICATIONS:  Resume her home medications which include, Glucotrol 10 mg p.o. b.i.d., Glucophage 500 mg p.o. b.i.d., Diozide 37.5/25 p.o. q.d., Protonix 40 mg p.o. q.d., Neurontin 300 mg p.o. t.i.d., hydrocodone 5/500 q.4-6h. p.r.n. pain relief, OxyContin CR 20 mg p.o. b.i.d., Clarinex 5 mg p.o. q.d., Ambien p.r.n. at bedtime.  FOLLOWUP:  Katherine Delgado has a follow-up appointment to see  Dr. Dorris Fetch at the [facility] office on Wednesday, April 10, and she has been instructed to have a chest x-ray taken at [facility] approximately one hour prior to that appointment. DD:   11/16/00 TD:  11/18/00 Job: [id number]

## 2011-01-07 NOTE — Consult Note (Signed)
[place]  Patient:    Katherine Delgado, Katherine Delgado                        MRN: [id number] Proc. Date: 07/05/00 Adm. Date:  [id number] Attending:  Charlett Lango                          Consultation Report  DATE OF BIRTH:  [birth year]  REFERRING PHYSICIAN:  Salvatore Decent. Dorris Fetch, M.D.  REASON FOR CONSULTATION:  Possible myasthenia gravis.  HISTORY OF PRESENT ILLNESS:  This is the initial inpatient consultation evaluation of this 70 year old female with a past medical history of hypertension, diabetes, and fibromyalgia who approximately 3 weeks ago reported acute onset of malaise, generalized fatigue without particular weakness, and some shortness of breath. She underwent a workup at that time, including a chest x-ray which revealed a questionable round pneumonia versus mediastinal adenopathy. This was followed up by a chest CT which revealed a mediastinal adenopathy and an anterior mediastinal mass. She was seen as an outpatient for this by Salvatore Decent. Dorris Fetch, M.D. and was scheduled to have mediastinoscopy with biopsy on November 12 for further evaluation. In the interval, she was apparently placed on steroids. Of note, she has a long history of a mild leukocytosis which is thought to be related to smoking. On November 8, she presented to the emergency room with generalized pain, nausea and vomiting. She became febrile and hypotensive several hours after admission. She was transferred to the ICU and briefly required dopamine. She was placed on antibiotics and subsequently all of this cleared. No infectious source was ever identified. She has been back on the floor for a few days and has remained stable, afebrile, with a decreasing white count. She underwent mediastinoscopy with biopsy of the anterior mediastinal mass yesterday, and this revealed normal ______ tissue. Concern has subsequently been raised over a lymphoma and whether or not her  symptoms may be related to myasthenia gravis. On specific questioning, she does report she has had some blurred vision which she attributes to elevated blood sugar due to the steroids. She denies any diplopia, dysarthria, dysphagia, jaw fatigue, or focal weakness. She has had some dyspnea on exertion.  PAST MEDICAL HISTORY:  Diabetes for 1 year as above, also hypertension, fibromyalgia, and gastroesophageal reflux disease.  PAST SURGICAL HISTORY:  She has had a cholecystectomy and back surgery in the past.  FAMILY HISTORY:  Mother died at 61 of diabetes and heart disease. Father died at 61 of alcoholism and heart disease. She has a sister and three brothers who are alive and well.  SOCIAL HISTORY:  She smokes about two cigarettes a day but has smoked up to a half of a pack a day in the past. She denies alcohol use. She lives in [place] with her husband.  REVIEW OF SYSTEMS:  GENERAL:  Negative. EYES:  Blurred vision as above. Denies diplopia. ENT:  Reports some "enlarged lymph nodes" on the right side of her neck with the onset of the illness which has now resolved. RESPIRATORY: Shortness of breath as above. CV:  Negative. GI:  Nausea and vomiting as above. GU:  Negative.  MEDICATIONS:  Insulin, Lasix, KCl, hydrocortisone, Ensure, estradiol, Maxzide, Protonix, Serevent, Glucotrol, Effexor, albuterol and Atrovent nebulizers.  PHYSICAL EXAMINATION:  VITAL SIGNS:  Temperature 97.5, blood pressure 155/75, pulse 70, respirations 20.  GENERAL:  She is alert and in no evident distress.  NECK:  Supple without bruits.  CHEST:  Clear to auscultation.  HEART:  Regular rate and rhythm without murmurs.  NEUROLOGICAL:  She is completely oriented. Her speech is normal in tone and content. Cranial nerves:  The funduscopic exam is benign. Pupils are equal and briskly reactive. There is no ptosis even with prolonged upgaze. Extraocular movements are intact without eliciting diplopia.  There is no abnormality with cover-uncover test. With red lens testing, there is a brief episode of vertical diplopia on the gaze upward and to the right. This is not subsequently reproducible. Eye closure strength is normal on the right and slightly weak on the left. Cheek puff, tongue protrusion, and jaw closure strength are all normal. Palate and tongue move normally in the midline.  MOTOR:  Normal bulk and tone. No atrophy or vesiculations. Normal strength in all tested extremity muscles.  SENSORY:  Intact to light touch and vibration in all extremities. Cerebellar exam:  Rapid alternating movements are normal. Finger-to-nose is normal. Deep tendon reflexes are 2+ in the upper extremities, 1+ in the lower extremities. Toes are downgoing. Gait exam is deferred.  LABORATORY DATA:  Remarkable for elevated WBCs on admission up to 30.4, now down to 13.1. She has had persistently elevated glucose and some hypokalemia, as well as low calcium, total protein, and albumin. Her CKs were checked on admission and were in the 60s.  IMPRESSION:  Malaise, fatigue, and shortness of breath in the setting of a possible thymoma. Given that her symptoms are very nonspecific and her exam is essentially normal, except for asymmetric eye closure weakness and a very brief episode of diplopia with red lens testing, it would probably be a reach to attribute these to myasthenia gravis. However, it cannot be entirely excluded as a cause of her general fatigue and possibly shortness of breath. Of note, it would not explain her hypotensive sepsis-type episode.  RECOMMENDATIONS: 1. Would check an acetylcholine receptor antibody panel. I have written for    this. If she does have myasthenia gravis in the setting of thymoma, this is    very likely to be positive. If negative, would not pursue the diagnosis any    further unless more clear symptoms such as diplopia, ptosis, or bulbar    symptoms develop. 2. She  will need eventual further surgical procedure to remove the mass and    completely define its histology.  Thank you for the consult. We will follow up on the lab results. DD:  07/05/00  TD:  07/05/00 Job: [id number]

## 2011-01-07 NOTE — Op Note (Signed)
[place]. [facility]  Patient:    Katherine Delgado, Katherine Delgado                        MRN: [id number] Adm. Date:  [id number] Attending:  Charlett Lango CC:         Talmadge Coventry, M.D.             Leighton Roach. Truett Perna, M.D.             Kelli Hope, M.D.             Charlaine Dalton. Sherene Sires, M.D. [facility]                           Operative Report  PREOPERATIVE DIAGNOSIS:  Thymoma.  POSTOPERATIVE DIAGNOSES:  Thymic hyperplasia, possible thymoma, possible lymphoma.  PROCEDURES:  Partial sternotomy, thymectomy.  SURGEON:  Salvatore Decent. Dorris Fetch, M.D.  ASSISTANTMaxwell Marion, R.N.F.A.  ANESTHESIA:  General.  FINDINGS:  Markedly enlarged thymus gland with nodular appearance.  Adhesions to surrounding structure but no true invasion.  Frozen section:  Thymic hyperplasia.  Additional studies for lymphoma and thymoma pending.  CLINICAL NOTE:  The patient is a 70 year old female who originally presented with right-sided chest pain and a cough.  She had a chest x-ray which showed possible lung mass, and a CT scan was performed which showed anterior mediastinal adenopathy.  She was seen in referral for possible mediastinoscopy but in the interim developed Guillain-Barre syndrome with marked peripheral muscular weakness.  After recovering adequately from that, she had mediastinoscopy, which revealed a lymphoid hyperplasia of the level 7 lymph nodes and an anterior mediastinal mass which was thymic tissue, either hyperplasia versus thymoma.  The patient subsequently was found to have a pelvic cyst, underwent gynecologic surgery.  After recovering from that, she now returns for consideration for removal of her thymic mass.  The indications, risks, benefits, and alternatives were discussed in detail with the patient.  She understood that if this was indeed a thymoma that the benign versus malignant nature would be only known for certain with operative excision.  The patient  understood and accepted the risks and agreed to proceed.  DESCRIPTION OF PROCEDURE:  The patient was brought to the preoperative holding area on November 13, 2000.  Intravenous access was obtained, and an arterial blood pressure monitoring catheter was placed.  EKG leads were placed for continuous telemetry.  The patient was given intravenous antibiotics.  She was taken to the operating room, anesthetized, and intubated.  The chest was prepped and draped in the usual fashion from the chin to the umbilicus.  A Foley catheter was placed.  The patient was draped in the usual fashion.  A midline incision was made in the chest over the midportion of the sternum.  It was carried through the skin and subcutaneous tissue.  The pectoralis fibers were divided in the midline.  The periosteum of the sternum was incised.  A partial sternotomy was done using the oscillating saw.  The sternum was divided in the midline through the manubrium and down to the level of the fourth intercostal space.  An L incision then was made by dividing the sternum transversely into the fourth intercostal space on the right side.  A retractor was placed after achieving hemostasis.  The thymus could be seen directly in the wound at this point.  The overlying investing tissues were incised.  A plane was initially  developed inferiorly along the pericardium.  The thymus was grossly enlarged and had a nodular texture to it.  There were adhesions to the pericardium but no true invasion.  Dissection after being completed inferiorly then was carried down to the right side.  At this point, the peripleural fat was difficult to differentiate from the thymic tissue, and that portion of the right-sided pleura was excised to ensure adequate margins. This was accomplished well away from the right phrenic nerve.  The thymus then was mobilized along the right side.  The internal mammary vein was doubly ligated and divided, and the dissection  was carried up to the level of the innominate vein.  At this point, the dissection then was carried to the left side, and in this case the palpable thymic mass extended all the way down to the level of the left phrenic nerve on the lateral aspect of the pericardium. The overlying pleura was incised, and the thymus and mediastinal fat was dissected off the phrenic nerve using sharp dissection.  Again there was no actual encasement or invasion of the nerve, and the nerve itself was preserved intact.  The mobilization then continued along the inferior aspect, reflecting the mass upward.  The innominate vein was identified.  There were multiple feeding venous branches, which were doubly clipped and divided.  After completely mobilizing the mass off of the thymus, the remaining attachments were divided with electrocautery.  There was no residual thymic tissue remaining.  The mass was sent for frozen section, which revealed thymic hyperplasia but cannot definitively rule out thymoma or lymphoma.  Additional tests will be run.  Palpation of the anterior pericardium then revealed two large fat pads, one on either side.  These were located relatively anteriorly and just inferior to the distal edge of the thymus.  These fat pads were excised and sent as separate specimens.  Additional lymph node was seen in the fat on the left side of the superior pericardium in the pleural space.  This also was excised and sent separately.  The chest was irrigated with warm normal saline containing 1 g of vancomycin.  Hemostasis was achieved.  A single chest tube was placed and passed through the right subcostal region through the right pleural space.  The tip of the tube was placed into the left pleural space to drain both of those spaces which had been opened during the dissection.  The pericardium itself was not opened during the dissection.  The chest tube was secured with a #1 silk suture.  The sternum then was  closed with heavy-gauge stainless steel wires through the manubrium.  Double-stranded  wires were used at the third intercostal space as well as at the transverse sternotomy.  The pectoralis fascia was closed with a running #1 Vicryl suture. Subcutaneous tissue was closed with a running 2-0 Vicryl suture, and the skin was closed with 3-0 Vicryl subcuticular suture.   All sponge, needle, and instrument counts were correct at the end of the procedure.  There were no intraoperative complications.  The patient was taken from the operating room to the surgical intensive care unit intubated, in stable condition. DD:  11/13/00 TD:  11/14/00 Job: [id number]

## 2011-01-07 NOTE — Procedures (Signed)
[place]. [facility]  Patient:    Katherine Delgado, Katherine Delgado                        MRN: [id number] Proc. Date: 07/19/00 Adm. Date:  [id number] Attending:  Fenton Malling                           Procedure Report  PROCEDURE:  Lumbar puncture.  HISTORY OF PRESENT ILLNESS:  This is a 70 year old with a history of progressive weakness to the lower extremities, absence of reflexes. The patient is being evaluated for possible Guillain-Barre syndrome.  The patient was placed in the fetal position on the right side. The lower back was cleaned with Betadine solution and approximately 2 cc of 1% xylocaine was used as local anesthetic. A 20 gauge spinal needle was inserted into the L3-4 interspace and approximately 16 cc of clear colorless spinal fluid was removed for testing. Opening pressure was 210 mm water. No complications from the above procedure were noted. The patient tolerated the procedure well. Tube #1 was sent for VDRL, ______ #2 was saved. Tube #3 was simply cells, differential group ______. Tube #4 was sent for a limes panel. No complications in the above procedure were noted. DD:  07/19/00 TD:  07/20/00 Job: [id number]

## 2011-01-07 NOTE — Procedures (Signed)
NAMEKrystol, [name]                 ACCOUNT NO.:  [id number]   MEDICAL RECORD NO.:  [id number]          PATIENT TYPE:  OUT   LOCATION:  SLEEP CENTER                 FACILITY:  [facility]   PHYSICIAN:  Clinton D. Maple Hudson, M.D. DATE OF BIRTH:  May 12, 1941   DATE OF STUDY:  06/22/2004                              NOCTURNAL POLYSOMNOGRAM   REFERRING PHYSICIAN:  Dr. Rise Mu Mazzocchi   INDICATION FOR STUDY:  Insomnia with sleep apnea.   EPWORTH SLEEPINESS SCORE:  3/24   BMI:  41   WEIGHT:  260 pounds.   She describes her problem as waking up after two or three hours of sleep  because of pain and need for the bathroom and leg cramps.   SLEEP ARCHITECTURE:  Total sleep time short at 275 minutes with sleep  efficiency 62%. Stage I was 22%, Stage II 75%, Stages III and IV 4%, REM was  absent, possibly reflecting antidepressant therapy.  Sleep latency was 37  minutes.  Awake after sleep onset 128 minutes. Arousal index elevated at 38.  Arousals seemed to correspond to leg movements.   RESPIRATORY DATA:  RDI 2.6/hr, which is within normal limits.  This  reflected 2 obstructive apneas and 10 hypopneas. Events were not positional.   OXYGEN DATA:  Moderate to loud snoring with oxygen desaturation to a nadir  of 88%.  Mean oxygen was 92% on room air through the study.   CARDIAC DATA:  Normal sinus rhythm with occasional PVC.   MOVEMENT/PARASOMNIA:  392 limb jerks were recorded of which 44 were  associated with arousal or awakening for a periodic limb movement with  arousal index of 9.6/hr which is markedly increased and may be enough to  contribute to poor sleep quality.   IMPRESSION/RECOMMENDATION:  Occasional sleep disorder breathing, within  normal limits, respiratory disturbance index 2.6/hr with desaturation to  88%.  Significant periodic limb movement with arousal syndrome, 9.6/hr, for  which therapy such as clonazepam or Requip might be considered if clinically  appropriate.                                                      Clinton D. Maple Hudson, M.D.  Diplomate, American Board   CDY/MEDQ  D:  06/27/2004 10:21:34  T:  06/28/2004 72:53:66  Job:  [id number]

## 2011-01-07 NOTE — H&P (Signed)
. [facility]  Patient:    Katherine Delgado, Katherine Delgado [facility]                          MRN: [id number] Adm. Date:  11/13/00 Attending:  Salvatore Decent. Dorris Fetch, M.D. Dictator:   Durenda Age, P.A.-C. CC:         Charlaine Dalton. Sherene Sires, M.D. [facility]  Jillyn Hidden B. Truett Perna, M.D.  Kelli Hope, M.D.   History and Physical  DATE OF BIRTH:  [birth year]   CHIEF COMPLAINT:  Thymoma.  HISTORY OF PRESENT ILLNESS:  Ms. Raney is a pleasant 70 year old, white female referred by Dr. Sonia Baller for evaluation of thymoma.  Around November 2001, she began experiencing cough and right-sided chest pain.  A chest x-ray revealed a possible lung mass versus pneumonia.  A CT of the chest revealed mediastinal lymphadenopathy and a possible anterior mediastinal mass.  The patient underwent bronchoscopy which was unremarkable with no abnormalities. Mediastinoscopy, however, showed an enlarged level VII lymph nodes and anterior cervical nodes.  The lymph nodes were negative, showing only hyperplasia.  The anterior mediastinal mass was thymic tissue with hyperplasia versus thymoma.  She was subsequently readmitted to the hospital with Guillain-Barre syndrome which she has now recovered.  She was scheduled for resection of the thymoma after recuperating.  Unfortunately, a new CT of the chest and abdomen in January 2002, demonstrated the 7 cm cystic pelvic mass, for which she had to undergo surgery at [facility].  She was seen again on October 25, 2000.  Dr. Dorris Fetch evaluated the patient and recommended to proceed with resection of this mass which is scheduled for November 13, 2000, for a better diagnosis of her disease.  No cough, sputum production or fevers or chills.  No unexplained weight loss noted.  She has occasional GERD symptoms.  She has no shortness of breath, although she becomes dyspneic on exertion after walking about 40 yards.  No paroxysmal nocturnal dyspnea noted. No angina or  arrhythmias noted.  No peripheral edema or hemoptysis, no symptoms of TIA, CVA or amaurosis fugax.  No history of PE or DVT.  She is still mildly weak.  PAST MEDICAL HISTORY:  1. Thymoma.  2. Hypertension.  3. Diabetes mellitus, non-insulin-dependent.  4. History of Guillain-Barre syndrome (GBS).  5. Status post IVIG treatment.  6. GERD symptoms.  7. Allergic rhinitis.  8. Recent history of pneumonia.  9. Diabetic neuropathy. 10. Previous history of tobacco abuse, quitting in November 2001. 11. Depression. 12. Previous history of leukocytosis with fever of unknown origin in November     2001. 13. Chronic obstructive pulmonary disease/asthma. 14. Fibromyalgia. 15. Obesity.  PAST SURGICAL HISTORY: 1. Status post cholecystectomy in 1984. 2. Status post back surgery in 1982. 3. Status post hysterectomy in 1974. 4. Status post cystic pelvic mass resection in January 2002. 5. Status post bronchoscopy and mediastinoscopy on July 04, 2000, by    Dr. Dorris Fetch.  MEDICATIONS: 1. Dyazide 37.5/25 mg q.d. 2. Vicodin 5/500 one p.o. q.4-6h. p.r.n. pain. 3. Glucotrol XL 10 mg q.d. 4. Atenolol 25 mg q.d. 5. Serevent inhaler p.r.n. 6. Neurontin 300 mg p.o. b.i.d. 7. OxyContin CR 20 mg one or two p.o. q.d. 8. Effexor 75 mg q.d.  ALLERGIES:  PENICILLIN.  BACTRIM.  REVIEW OF SYSTEMS:  See HPI and past medical history for significant positives.  Other than history of diabetes and residual symptoms of GBS, no kidney disease, no falls, no visual symptoms and  no urinary symptoms are experienced by this patient.  FAMILY HISTORY:  Mother died at 44 with diabetes and heart disease.  Father died at 9 of alcohol complications with history of heart disease.  One sister alive and well.  Three brothers alive and well.  SOCIAL HISTORY:  She is married with two children.  She is on Disability for the last 12 years.  This is secondary to her back pain, chronic.  She quit in November 2001, the  use of tobacco.  She denies any alcohol intake.  PHYSICAL EXAMINATION:  GENERAL:  This is a well-developed, obese, 70 year old, white female in no acute distress, alert and oriented x 3.  VITAL SIGNS:  Blood pressure 150/70, respiratory rate 18, pulse 64.  HEENT:  Normocephalic, atraumatic.  PERRLA.  EOMI.  Funduscopic exam within normal limits.  NECK:  Supple with no JVD, bruits or lymphadenopathy.  CHEST:  Symmetrical on inspiration.  LUNGS:  Clear to auscultation bilaterally.  CARDIOVASCULAR:  Regular rate and rhythm with 1/6 systolic murmur with no rubs or gallops.  ABDOMEN:  Soft, nontender.  Bowel sounds x 4.  No masses or bruits.  GENITALIA:  Deferred.  RECTAL:  Deferred.  EXTREMITIES:  No clubbing or cyanosis.  No edema.  No ulcerations and warm temperatures.  PERIPHERAL PULSES:  Carotid 2+ bilaterally.  Femoral, popliteal, dorsalis pedis and posterior tibialis 2+ bilaterally.  NEUROLOGIC:  Gait steady.  Nonfocal.  DTRs 2+ bilaterally.  Muscle strength slightly decreased in the right upper extremity with 4/5 and 5+ for rest.  ASSESSMENT/PLAN:  A 70 year old, white female with possible thymoma for thymectomy through partial sternotomy at [facility] on November 13, 2000, by Dr. Dorris Fetch.  Dr. Dorris Fetch has seen and evaluated this patient prior to the admission and has explained the risks and benefits of the procedure and the patient has agreed to continue. DD:  11/10/00 TD:  11/10/00 Job: [id number]

## 2011-01-07 NOTE — Assessment & Plan Note (Signed)
[facility]                              CARDIOLOGY OFFICE NOTE   NAME:Katherine Delgado, Katherine Delgado                        MRN:          [id number]  DATE:07/12/2006                            DOB:          [birth year]    PRIMARY:  Dr. Smith Mince   REASON FOR REFERRAL:  Evaluate patient with chest pain.   HISTORY OF PRESENT ILLNESS:  The patient is a pleasant 70 year old white  female without prior cardiac history.  Dr. Smith Mince called today concerned  about some chest discomfort.  The patient has had a previous workup with a  stress perfusion study in 2005 that demonstrated an EF of 77% and no  evidence of ischemia or infarct.  She says she has been having discomfort  really for the last 3 or 4 days.  It has been constant.  She describes a  pressure.  It may be 4/10 in intensity.  There are some mild fleeting pains  as well.  It seems to wax and wane in intensity.  It may be more with  activity, though she has been relatively sedentary.  She has had some  increase in this when she lies flat.  She has a little more shortness of  breath with exertion but no PND or orthopnea.  She says this is not like her  previous fibromyalgia.  It may be a little bit better with Lortab but she is  not taking anything else to try to improve this.  She has been a little more  tired lately.  She may have had some slight radiation into her arms but she  does not describe any neck discomfort.  She has had no nausea or vomiting.   PAST MEDICAL HISTORY:  1. Hyperlipidemia for years.  2. Diabetes mellitus for 7 years.  3. Hypertension for years.  4. Pneumonia.  5. Guillain-Barre.   PAST SURGICAL HISTORY:  1. Ovarian cyst resected.  2. Hysterectomy.  3. Back surgery from herniated disc.  4. Cholecystectomy.  5. Abdominal tumors and ovary resected.  6. Thymus surgery.   ALLERGIES:  PENICILLIN.   MEDICATIONS:  1. Lortab.  2. Effexor 75 mg daily.  3. Diazide 37.5/25 daily.  4. Zestril 40 mg daily.  5. Clonidine 0.1 mg b.i.d.  6. NovoLog insulin.  7. Lantus insulin.  8. Actos 45 mg a day.  9. Crestor 10 mg daily.  10.Levoxyl 100 mcg daily.  11.Furosemide 40 mg p.r.n.  12.Potassium 10 mEq with the Lasix.  13.Nexium 40 mg p.r.n.  14.Rozerem.  15.Neurontin 300 mg q.h.s.  16.Januvia.  17.Metformin 1000 mg b.i.d.  18.Omacor 1 g b.i.d.  19.Byetta 10 mcg b.i.d.   SOCIAL HISTORY:  The patient is a widow.  She has two adult sons.  She is  retired.  She smoked for about 7 years but quit a while ago.   FAMILY HISTORY:  Noncontributory for early coronary artery disease in first-  degree relatives.  Both her parents had some vague heart problems along with  other illnesses.   REVIEW OF SYSTEMS:  As stated in the HPI and  positive for neuropathy,  progressive weight gain, sleeping in a recliner which she has done for years  because it is easier for her to breathe (however, again I do not think this  is classic PND or orthopnea), headaches, reflux.  Negative for other  systems.   PHYSICAL EXAMINATION:  GENERAL:  The patient is in no acute distress.  VITAL SIGNS:  Blood pressure 144/80, heart rate 108 and regular, pleasant,  afebrile.  HEENT:  Eyes unremarkable.  Pupils equal, round, and react to light.  Fundi  within normal limits.  Oral mucosa unremarkable.  NECK:  No jugular venous distention at 45 degrees.  Carotid upstroke brisk  and symmetric.  No bruits, no thyromegaly.  LYMPHATICS:  No cervical, axillary, or inguinal adenopathy.  LUNGS:  Clear to auscultation bilaterally.  BACK:  No costovertebral angle tenderness.  CHEST:  Unremarkable.  HEART:  PMI not displaced or sustained.  S1 and S2 within normal limits.  No  S3, no S4, no murmurs.  ABDOMEN:  Morbidly obese, positive bowel sounds normal in frequency and  pitch.  No bruits, no rebound, no guarding.  No midline pulsatile mass.  No  hepatomegaly or splenomegaly.  SKIN:  No rashes, no nodules.   EXTREMITIES:  Show 2+ pulses throughout.  No edema, no cyanosis, no  clubbing.  NEUROLOGIC:  Oriented to person, place and time.  Cranial nerves II-XII  grossly intact.  Motor grossly intact throughout.   EKG (done in Dr. Stephannie Peters office):  Sinus rhythm, rate 72, axis within  normal limits, intervals within normal limits, no acute ST wave change.   ASSESSMENT AND PLAN:  1. Chest discomfort.  The patient's chest discomfort has some worrisome      features but also some atypical features.  There is no objective      evidence of ischemia.  At this point I think it is reasonable to do a      stress perfusion study.  Will arrange this for Friday.  I did give her      some Imdur 30 mg along with her other medications.  She also knows to      go to the emergency room should she have any increasing symptoms.  2. Obesity.  She understands the need to lose weight with diet and      exercise.  3. Hypertension.  Blood pressure is slightly elevated today.  I will not      make any medication changes other than above.  Therapeutic lifestyle      changes with weight loss might help.  4. Follow up will be based on the results of the stress test.     Rollene Rotunda, MD, [facility]  Electronically Signed    JH/MedQ  DD: 07/12/2006  DT: 07/12/2006  Job #: [id number]   cc:   Talmadge Coventry, M.D.

## 2011-05-04 ENCOUNTER — Other Ambulatory Visit: Payer: Self-pay | Admitting: Oncology

## 2011-05-04 ENCOUNTER — Encounter (HOSPITAL_BASED_OUTPATIENT_CLINIC_OR_DEPARTMENT_OTHER): Payer: Medicare Other | Admitting: Oncology

## 2011-05-04 DIAGNOSIS — D72829 Elevated white blood cell count, unspecified: Secondary | ICD-10-CM

## 2011-05-04 DIAGNOSIS — D7289 Other specified disorders of white blood cells: Secondary | ICD-10-CM

## 2011-05-04 LAB — CBC WITH DIFFERENTIAL/PLATELET
Eosinophils Absolute: 0.3 10*3/uL (ref 0.0–0.5)
MONO#: 0.5 10*3/uL (ref 0.1–0.9)
NEUT#: 7.2 10*3/uL — ABNORMAL HIGH (ref 1.5–6.5)
RBC: 4.59 10*6/uL (ref 3.70–5.45)
RDW: 16.3 % — ABNORMAL HIGH (ref 11.2–14.5)
WBC: 10.3 10*3/uL (ref 3.9–10.3)
lymph#: 2 10*3/uL (ref 0.9–3.3)

## 2011-05-09 LAB — JAK-2 V617F

## 2011-05-09 LAB — BCR/ABL (LIO MMD)

## 2011-09-08 DIAGNOSIS — J0100 Acute maxillary sinusitis, unspecified: Secondary | ICD-10-CM | POA: Diagnosis not present

## 2011-09-08 DIAGNOSIS — M069 Rheumatoid arthritis, unspecified: Secondary | ICD-10-CM | POA: Diagnosis not present

## 2011-09-08 DIAGNOSIS — J440 Chronic obstructive pulmonary disease with acute lower respiratory infection: Secondary | ICD-10-CM | POA: Diagnosis not present

## 2011-09-08 DIAGNOSIS — J209 Acute bronchitis, unspecified: Secondary | ICD-10-CM | POA: Diagnosis not present

## 2011-10-01 ENCOUNTER — Telehealth: Payer: Self-pay | Admitting: Oncology

## 2011-10-01 NOTE — Telephone Encounter (Signed)
Called pt, left message, appt on 02/02/12, lab and md

## 2011-10-10 ENCOUNTER — Other Ambulatory Visit: Payer: Self-pay | Admitting: Internal Medicine

## 2011-10-10 ENCOUNTER — Ambulatory Visit
Admission: RE | Admit: 2011-10-10 | Discharge: 2011-10-10 | Disposition: A | Discharge status: Home / Self Care | Payer: Medicare Other | Source: Ambulatory Visit | Attending: Internal Medicine | Admitting: Internal Medicine

## 2011-10-10 DIAGNOSIS — J440 Chronic obstructive pulmonary disease with acute lower respiratory infection: Secondary | ICD-10-CM | POA: Diagnosis not present

## 2011-10-10 DIAGNOSIS — R0602 Shortness of breath: Secondary | ICD-10-CM

## 2011-10-10 DIAGNOSIS — R059 Cough, unspecified: Secondary | ICD-10-CM

## 2011-10-10 DIAGNOSIS — J984 Other disorders of lung: Secondary | ICD-10-CM | POA: Diagnosis not present

## 2011-10-10 DIAGNOSIS — R05 Cough: Secondary | ICD-10-CM

## 2011-10-10 DIAGNOSIS — I10 Essential (primary) hypertension: Secondary | ICD-10-CM | POA: Diagnosis not present

## 2011-10-10 DIAGNOSIS — J209 Acute bronchitis, unspecified: Secondary | ICD-10-CM | POA: Diagnosis not present

## 2011-10-10 DIAGNOSIS — M069 Rheumatoid arthritis, unspecified: Secondary | ICD-10-CM | POA: Diagnosis not present

## 2011-10-10 DIAGNOSIS — J40 Bronchitis, not specified as acute or chronic: Secondary | ICD-10-CM

## 2011-10-10 DIAGNOSIS — E1159 Type 2 diabetes mellitus with other circulatory complications: Secondary | ICD-10-CM | POA: Diagnosis not present

## 2011-10-10 DIAGNOSIS — E039 Hypothyroidism, unspecified: Secondary | ICD-10-CM | POA: Diagnosis not present

## 2011-10-20 ENCOUNTER — Emergency Department (HOSPITAL_COMMUNITY)
Admission: EM | Admit: 2011-10-20 | Discharge: 2011-10-20 | Disposition: A | Discharge status: Home / Self Care | Payer: Medicare Other | Attending: Emergency Medicine | Admitting: Emergency Medicine

## 2011-10-20 ENCOUNTER — Other Ambulatory Visit: Payer: Self-pay

## 2011-10-20 ENCOUNTER — Encounter (HOSPITAL_COMMUNITY): Payer: Self-pay | Admitting: Emergency Medicine

## 2011-10-20 ENCOUNTER — Emergency Department (HOSPITAL_COMMUNITY): Payer: Medicare Other

## 2011-10-20 DIAGNOSIS — J4489 Other specified chronic obstructive pulmonary disease: Secondary | ICD-10-CM | POA: Insufficient documentation

## 2011-10-20 DIAGNOSIS — E119 Type 2 diabetes mellitus without complications: Secondary | ICD-10-CM | POA: Diagnosis not present

## 2011-10-20 DIAGNOSIS — M069 Rheumatoid arthritis, unspecified: Secondary | ICD-10-CM | POA: Diagnosis not present

## 2011-10-20 DIAGNOSIS — R05 Cough: Secondary | ICD-10-CM | POA: Insufficient documentation

## 2011-10-20 DIAGNOSIS — R059 Cough, unspecified: Secondary | ICD-10-CM | POA: Diagnosis not present

## 2011-10-20 DIAGNOSIS — D72829 Elevated white blood cell count, unspecified: Secondary | ICD-10-CM | POA: Diagnosis not present

## 2011-10-20 DIAGNOSIS — I10 Essential (primary) hypertension: Secondary | ICD-10-CM | POA: Insufficient documentation

## 2011-10-20 DIAGNOSIS — J40 Bronchitis, not specified as acute or chronic: Secondary | ICD-10-CM | POA: Insufficient documentation

## 2011-10-20 DIAGNOSIS — J209 Acute bronchitis, unspecified: Secondary | ICD-10-CM | POA: Diagnosis not present

## 2011-10-20 DIAGNOSIS — R0602 Shortness of breath: Secondary | ICD-10-CM | POA: Insufficient documentation

## 2011-10-20 DIAGNOSIS — J449 Chronic obstructive pulmonary disease, unspecified: Secondary | ICD-10-CM | POA: Diagnosis not present

## 2011-10-20 DIAGNOSIS — N390 Urinary tract infection, site not specified: Secondary | ICD-10-CM | POA: Diagnosis not present

## 2011-10-20 HISTORY — DX: Chronic obstructive pulmonary disease, unspecified: J44.9

## 2011-10-20 LAB — COMPREHENSIVE METABOLIC PANEL
Alkaline Phosphatase: 106 U/L (ref 39–117)
BUN: 28 mg/dL — ABNORMAL HIGH (ref 6–23)
CO2: 26 mEq/L (ref 19–32)
Chloride: 101 mEq/L (ref 96–112)
Creatinine, Ser: 1.12 mg/dL — ABNORMAL HIGH (ref 0.50–1.10)
GFR calc Af Amer: 56 mL/min — ABNORMAL LOW (ref >90)
GFR calc non Af Amer: 49 mL/min — ABNORMAL LOW (ref >90)
Glucose, Bld: 180 mg/dL — ABNORMAL HIGH (ref 70–99)
Potassium: 4.5 mEq/L (ref 3.5–5.1)
Total Bilirubin: 0.3 mg/dL (ref 0.3–1.2)

## 2011-10-20 LAB — URINALYSIS, ROUTINE W REFLEX MICROSCOPIC
Glucose, UA: NEGATIVE mg/dL
Ketones, ur: NEGATIVE mg/dL
Nitrite: NEGATIVE
Protein, ur: NEGATIVE mg/dL
Urobilinogen, UA: 0.2 mg/dL (ref 0.0–1.0)

## 2011-10-20 LAB — DIFFERENTIAL
Basophils Absolute: 0.2 10*3/uL — ABNORMAL HIGH (ref 0.0–0.1)
Basophils Relative: 1 % (ref 0–1)
Lymphocytes Relative: 13 % (ref 12–46)
Lymphs Abs: 2.4 10*3/uL (ref 0.7–4.0)
Monocytes Relative: 7 % (ref 3–12)
Neutro Abs: 14 10*3/uL — ABNORMAL HIGH (ref 1.7–7.7)

## 2011-10-20 LAB — CBC
HCT: 43 % (ref 36.0–46.0)
Hemoglobin: 13.6 g/dL (ref 12.0–15.0)
MCHC: 31.6 g/dL (ref 30.0–36.0)
MCV: 84.5 fL (ref 78.0–100.0)
RDW: 13.4 % (ref 11.5–15.5)
WBC: 18.1 10*3/uL — ABNORMAL HIGH (ref 4.0–10.5)

## 2011-10-20 LAB — URINE MICROSCOPIC-ADD ON

## 2011-10-20 LAB — CARDIAC PANEL(CRET KIN+CKTOT+MB+TROPI)
Relative Index: INVALID (ref 0.0–2.5)
Total CK: 74 U/L (ref 7–177)

## 2011-10-20 MED ORDER — CIPROFLOXACIN HCL 250 MG PO TABS: 250.0000 mg | ORAL_TABLET | Freq: Two times a day (BID) | ORAL | Status: AC

## 2011-10-20 MED ORDER — CEFTRIAXONE SODIUM 1 G IJ SOLR
1.0000 g | Freq: Once | INTRAMUSCULAR | Status: AC
  Administered 2011-10-20: 1 g via INTRAMUSCULAR
  Filled 2011-10-20: qty 10

## 2011-10-20 MED ORDER — IOHEXOL 350 MG/ML SOLN
100.0000 mL | Freq: Once | INTRAVENOUS | Status: AC | PRN
  Administered 2011-10-20: 100 mL via INTRAVENOUS

## 2011-10-20 NOTE — ED Provider Notes (Signed)
Medical screening examination/treatment/procedure(s) were conducted as a shared visit with non-physician practitioner(s) and myself.  I personally evaluated the patient during the encounter   Glynn Octave, MD 10/20/11 2156

## 2011-10-20 NOTE — ED Notes (Signed)
Walked patient, her stats never dropped stayed at 98 room air but, patient seem a little short of breathe

## 2011-10-20 NOTE — ED Notes (Signed)
Has been sob and went to dr has  A high wbc and they do not know what is causing it

## 2011-10-20 NOTE — ED Provider Notes (Signed)
History     CSN: [id number]  Arrival date & time 10/20/11  1018   First MD Initiated Contact with Patient 10/20/11 1210      Chief Complaint  Patient presents with  . Shortness of Breath    (Consider location/radiation/quality/duration/timing/severity/associated sxs/prior treatment) HPI Comments: Sent by PCP Dr. Renato Gails for elevated white blood cell count that was drawn 4 days ago. Patient states was 18. She's been treated multiple times over the past 2 months for bronchitis and pneumonia. She is not been admitted. She's not been on any antibiotics or steroids for the past 3 weeks. She still complains of shortness of breath worsens with exertion as well as a productive cough. She denies any chest pain, fever, abdominal pain, nausea or vomiting. He states he's been sick for 2 months the symptoms are the same as they were beginning. There is a history of COPD as well as rheumatoid arthritis and high blood pressure. She denies any leg pain or swelling or history of blood clots.  The history is provided by the patient.    Past Medical History  Diagnosis Date  . COPD (chronic obstructive pulmonary disease)   . Hypertension   . RA (rheumatoid arthritis)   . Thyroid disease   . Guillain-Barre     Past Surgical History  Procedure Date  . Abdominal hysterectomy   . Tonsillectomy   . Cholecystectomy   . Back surgery     No family history on file.  History  Substance Use Topics  . Smoking status: Former Games developer  . Smokeless tobacco: Not on file  . Alcohol Use: Yes    OB History    Grav Para Term Preterm Abortions TAB SAB Ect Mult Living                  Review of Systems  Constitutional: Positive for activity change and appetite change. Negative for fever.  HENT: Positive for congestion.   Respiratory: Positive for cough and shortness of breath. Negative for chest tightness.   Cardiovascular: Negative for chest pain.  Gastrointestinal: Negative for nausea, vomiting and  abdominal pain.  Genitourinary: Negative for dysuria, hematuria, vaginal bleeding and vaginal discharge.  Musculoskeletal: Negative for back pain.  Skin: Negative for rash.  Neurological: Negative for headaches.    Allergies  Penicillins and Sulfamethoxazole w/trimethoprim  Home Medications   Current Outpatient Rx  Name Route Sig Dispense Refill  . AMITRIPTYLINE HCL 25 MG PO TABS Oral Take 25 mg by mouth at bedtime.    . ASPIRIN EC 81 MG PO TBEC Oral Take 81 mg by mouth daily.    . BECLOMETHASONE DIPROPIONATE 80 MCG/ACT IN AERS Inhalation Inhale 1 puff into the lungs 2 (two) times daily as needed. For wheezing    . VITAMIN D-3 5000 UNITS PO TABS Oral Take 1 tablet by mouth daily.    . DULOXETINE HCL 60 MG PO CPEP Oral Take 60 mg by mouth daily.    Marland Kitchen FOLIC ACID 1 MG PO TABS Oral Take 2 mg by mouth daily.    . FUROSEMIDE 40 MG PO TABS Oral Take 40 mg by mouth daily.    Marland Kitchen GABAPENTIN 300 MG PO CAPS Oral Take 300-600 mg by mouth 3 (three) times daily as needed. For pain.    Marland Kitchen GUAIFENESIN 400 MG PO TABS Oral Take 400 mg by mouth every 4 (four) hours as needed. For cough    . HYDROCODONE-ACETAMINOPHEN 10-500 MG PO TABS Oral Take 1 tablet by mouth every  6 (six) hours as needed. For pain    . INSULIN ASPART 100 UNIT/ML Harper SOLN Subcutaneous Inject 6-10 Units into the skin 3 (three) times daily before meals. Sliding scale    . INSULIN GLARGINE 100 UNIT/ML  SOLN Subcutaneous Inject 70 Units into the skin at bedtime.    Marland Kitchen LEVOTHYROXINE SODIUM 150 MCG PO TABS Oral Take 150 mcg by mouth daily.    Marland Kitchen LIDOCAINE 5 % EX PTCH Transdermal Place 0.5 patches onto the skin daily. Remove & Discard patch within 12 hours or as directed by MD    . LISINOPRIL 40 MG PO TABS Oral Take 40 mg by mouth daily.    Marland Kitchen NYAMYC 100000 UNIT/GM EX POWD Apply externally Apply topically 2 (two) times daily.    . OMEGA-3-ACID ETHYL ESTERS 1 G PO CAPS Oral Take 2 g by mouth 2 (two) times daily.    Marland Kitchen PIOGLITAZONE HCL 45 MG PO TABS  Oral Take 45 mg by mouth daily.    Marland Kitchen POLYVINYL ALCOHOL 1.4 % OP SOLN Both Eyes Place 1 drop into both eyes as needed.    Marland Kitchen POTASSIUM CHLORIDE ER 10 MEQ PO TBCR Oral Take 20 mEq by mouth daily.    Marland Kitchen TIOTROPIUM BROMIDE MONOHYDRATE 18 MCG IN CAPS Inhalation Place 18 mcg into inhaler and inhale daily.    . TRIAMTERENE-HCTZ 37.5-25 MG PO TABS Oral Take 1 tablet by mouth daily.    Marland Kitchen CIPROFLOXACIN HCL 250 MG PO TABS Oral Take 1 tablet (250 mg total) by mouth 2 (two) times daily. 6 tablet 0    BP 147/48  Pulse 105  Temp(Src) 98.2 F (36.8 C) (Oral)  Resp 14  SpO2 93%  Physical Exam  Constitutional: She is oriented to person, place, and time. She appears well-developed and well-nourished. No distress.  HENT:  Head: Normocephalic and atraumatic.  Mouth/Throat: Oropharynx is clear and moist. No oropharyngeal exudate.  Eyes: Conjunctivae and EOM are normal. Pupils are equal, round, and reactive to light.  Neck: Normal range of motion. Neck supple.  Cardiovascular: Normal rate, regular rhythm and normal heart sounds.   No murmur heard. Pulmonary/Chest: Effort normal and breath sounds normal. No respiratory distress. She has no wheezes.  Abdominal: Soft. There is no tenderness. There is no rebound and no guarding.  Musculoskeletal: Normal range of motion. She exhibits no edema and no tenderness.       No swelling, asymmetry or palpable cords  Neurological: She is alert and oriented to person, place, and time. No cranial nerve deficit.  Skin: Skin is warm.    ED Course  Procedures (including critical care time)  Labs Reviewed  CBC - Abnormal; Notable for the following:    WBC 18.1 (*) WHITE COUNT CONFIRMED ON SMEAR   All other components within normal limits  DIFFERENTIAL - Abnormal; Notable for the following:    Neutrophils Relative 78 (*)    Neutro Abs 14.0 (*)    Monocytes Absolute 1.3 (*)    Basophils Absolute 0.2 (*)    All other components within normal limits  COMPREHENSIVE  METABOLIC PANEL - Abnormal; Notable for the following:    Glucose, Bld 180 (*)    BUN 28 (*)    Creatinine, Ser 1.12 (*)    GFR calc non Af Amer 49 (*)    GFR calc Af Amer 56 (*)    All other components within normal limits  URINALYSIS, ROUTINE W REFLEX MICROSCOPIC - Abnormal; Notable for the following:    APPearance  CLOUDY (*)    Leukocytes, UA LARGE (*)    All other components within normal limits  D-DIMER, QUANTITATIVE - Abnormal; Notable for the following:    D-Dimer, Quant 3.24 (*)    All other components within normal limits  URINE MICROSCOPIC-ADD ON - Abnormal; Notable for the following:    Bacteria, UA FEW (*)    Casts GRANULAR CAST (*)    All other components within normal limits  CARDIAC PANEL(CRET KIN+CKTOT+MB+TROPI)  URINE CULTURE   Dg Chest 2 View  10/20/2011  *RADIOLOGY REPORT*  Clinical Data: Shortness of breath, diabetes and hypertension  CHEST - 2 VIEW  Comparison: October 10, 2011  Findings: The cardiac silhouette, mediastinum, pulmonary vasculature are within normal limits.  Both lungs are clear. There is no acute bony abnormality.  IMPRESSION: There is no evidence of acute cardiac or pulmonary process.  Original Report Authenticated By: Brandon Melnick, M.D.   Ct Angio Chest W/cm &/or Wo Cm  10/20/2011  *RADIOLOGY REPORT*  Clinical Data: Increased D-dimer and shortness of breath; elevated white count  CT ANGIOGRAPHY CHEST  Technique:  Multidetector CT imaging of the chest using the standard protocol during bolus administration of intravenous contrast. Multiplanar reconstructed images including MIPs were obtained and reviewed to evaluate the vascular anatomy.  Contrast: OMNIPAQUE IOHEXOL 350 MG/ML IV SOLN  Comparison: April 17, 2008  Findings: There are no filling defects within either pulmonary arterial system to suggest a segmental or subsegmental pulmonary embolus.  Both lungs are clear.  The osseous structures are unremarkable.  There is no axillary,  mediastinum, or hilar adenopathy.  There is a 13 x 9 cm lymph node in the right hilar region which is stable to slightly smaller than on the prior study. The thoracic aorta has a normal caliber and appearance.  The visualized upper abdomen is unremarkable.  IMPRESSION: There is no evidence of pulmonary embolus or other acute abnormality.  Original Report Authenticated By: Brandon Melnick, M.D.     1. UTI (lower urinary tract infection)   2. Bronchitis   3. Leukocytosis       MDM  Acute on chronic shortness of breath with leukocytosis. No recent steroid or antibiotic use. No distress, lungs clear, no hypoxia.  CXR clear.  Infected UA is likely source of leukocytosis. AMbulatory in the ED with no desaturation or SOB.  CTPE pending given positive Ddimer.  Shared visit with [facility].     Date: 10/20/2011  Rate: 97  Rhythm: normal sinus rhythm  QRS Axis: normal  Intervals: normal  ST/T Wave abnormalities: normal  Conduction Disutrbances:none  Narrative Interpretation:   Old EKG Reviewed: unchanged    Glynn Octave, MD 10/20/11 2153

## 2011-10-20 NOTE — ED Provider Notes (Signed)
3:51 PM Patient is in CDU holding for CT angio chest to r/o PE.  Sign out received from Dr Manus Gunning. Patient sent to ED by PCP for elevated WBC count, found to have urinary tract infection which is likely the cause of the leukocytosis.  Pt reports she is feeling slightly weak now.  Denies CP, SOB.  On exam, pt is A&Ox4, NAD, RRR, CTAB, abd soft, NT.    CT angio negative.  Discussed results with patient.  Patient to be d/c home with PCP follow up.  Patient verbalizes understanding and agrees with plan.    Dillard Cannon [place], [place] 10/20/11 2129

## 2011-10-20 NOTE — ED Notes (Signed)
Patient transported to X-ray 

## 2011-10-20 NOTE — Discharge Instructions (Signed)
Please follow up with your primary care provider.  You may return to the ER at any time for worsening condition or any new symptoms that concern you.   Urinary Tract Infection Infections of the urinary tract can start in several places. A bladder infection (cystitis), a kidney infection (pyelonephritis), and a prostate infection (prostatitis) are different types of urinary tract infections (UTIs). They usually get better if treated with medicines (antibiotics) that kill germs. Take all the medicine until it is gone. You or your child may feel better in a few days, but TAKE ALL MEDICINE or the infection may not respond and may become more difficult to treat. HOME CARE INSTRUCTIONS   Drink enough water and fluids to keep the urine clear or pale yellow. Cranberry juice is especially recommended, in addition to large amounts of water.   Avoid caffeine, tea, and carbonated beverages. They tend to irritate the bladder.   Alcohol may irritate the prostate.   Only take over-the-counter or prescription medicines for pain, discomfort, or fever as directed by your caregiver.  To prevent further infections:  Empty the bladder often. Avoid holding urine for long periods of time.   After a bowel movement, women should cleanse from front to back. Use each tissue only once.   Empty the bladder before and after sexual intercourse.  FINDING OUT THE RESULTS OF YOUR TEST Not all test results are available during your visit. If your or your child's test results are not back during the visit, make an appointment with your caregiver to find out the results. Do not assume everything is normal if you have not heard from your caregiver or the medical facility. It is important for you to follow up on all test results. SEEK MEDICAL CARE IF:   There is back pain.   Your baby is older than 3 months with a rectal temperature of 100.5 F (38.1 C) or higher for more than 1 day.   Your or your child's problems  (symptoms) are no better in 3 days. Return sooner if you or your child is getting worse.  SEEK IMMEDIATE MEDICAL CARE IF:   There is severe back pain or lower abdominal pain.   You or your child develops chills.   You have a fever.   Your baby is older than 3 months with a rectal temperature of 102 F (38.9 C) or higher.   Your baby is 63 months old or younger with a rectal temperature of 100.4 F (38 C) or higher.   There is nausea or vomiting.   There is continued burning or discomfort with urination.  MAKE SURE YOU:   Understand these instructions.   Will watch your condition.   Will get help right away if you are not doing well or get worse.  Document Released: 05/18/2005 Document Revised: 04/20/2011 Document Reviewed: 12/21/2006 [facility] Patient Information 2012 [place], [place].Bronchitis Bronchitis is the body's way of reacting to injury and/or infection (inflammation) of the bronchi. Bronchi are the air tubes that extend from the windpipe into the lungs. If the inflammation becomes severe, it may cause shortness of breath. CAUSES  Inflammation may be caused by:  A virus.   Germs (bacteria).   Dust.   Allergens.   Pollutants and many other irritants.  The cells lining the bronchial tree are covered with tiny hairs (cilia). These constantly beat upward, away from the lungs, toward the mouth. This keeps the lungs free of pollutants. When these cells become too irritated and are unable  to do their job, mucus begins to develop. This causes the characteristic cough of bronchitis. The cough clears the lungs when the cilia are unable to do their job. Without either of these protective mechanisms, the mucus would settle in the lungs. Then you would develop pneumonia. Smoking is a common cause of bronchitis and can contribute to pneumonia. Stopping this habit is the single most important thing you can do to help yourself. TREATMENT   Your caregiver may prescribe an antibiotic  if the cough is caused by bacteria. Also, medicines that open up your airways make it easier to breathe. Your caregiver may also recommend or prescribe an expectorant. It will loosen the mucus to be coughed up. Only take over-the-counter or prescription medicines for pain, discomfort, or fever as directed by your caregiver.   Removing whatever causes the problem (smoking, for example) is critical to preventing the problem from getting worse.   Cough suppressants may be prescribed for relief of cough symptoms.   Inhaled medicines may be prescribed to help with symptoms now and to help prevent problems from returning.   For those with recurrent (chronic) bronchitis, there may be a need for steroid medicines.  SEEK IMMEDIATE MEDICAL CARE IF:   During treatment, you develop more pus-like mucus (purulent sputum).   You have a fever.   Your baby is older than 3 months with a rectal temperature of 102 F (38.9 C) or higher.   Your baby is 42 months old or younger with a rectal temperature of 100.4 F (38 C) or higher.   You become progressively more ill.   You have increased difficulty breathing, wheezing, or shortness of breath.  It is necessary to seek immediate medical care if you are elderly or sick from any other disease. MAKE SURE YOU:   Understand these instructions.   Will watch your condition.   Will get help right away if you are not doing well or get worse.  Document Released: 08/08/2005 Document Revised: 04/20/2011 Document Reviewed: 06/17/2008 [facility] Patient Information 2012 [place], [place].

## 2011-10-22 LAB — URINE CULTURE: Colony Count: >=100000

## 2011-10-23 NOTE — ED Notes (Signed)
+   Urine  Chart sent to EDP office; no sensitivities listed

## 2011-10-27 NOTE — ED Notes (Signed)
Patient on cipro and feeling fine.

## 2011-11-01 DIAGNOSIS — M3500 Sicca syndrome, unspecified: Secondary | ICD-10-CM | POA: Diagnosis not present

## 2011-11-01 DIAGNOSIS — M25569 Pain in unspecified knee: Secondary | ICD-10-CM | POA: Diagnosis not present

## 2011-11-01 DIAGNOSIS — IMO0001 Reserved for inherently not codable concepts without codable children: Secondary | ICD-10-CM | POA: Diagnosis not present

## 2011-11-01 DIAGNOSIS — M069 Rheumatoid arthritis, unspecified: Secondary | ICD-10-CM | POA: Diagnosis not present

## 2011-11-21 DIAGNOSIS — J42 Unspecified chronic bronchitis: Secondary | ICD-10-CM | POA: Diagnosis not present

## 2011-11-21 DIAGNOSIS — R635 Abnormal weight gain: Secondary | ICD-10-CM | POA: Diagnosis not present

## 2011-11-21 DIAGNOSIS — E1159 Type 2 diabetes mellitus with other circulatory complications: Secondary | ICD-10-CM | POA: Diagnosis not present

## 2011-11-21 DIAGNOSIS — G894 Chronic pain syndrome: Secondary | ICD-10-CM | POA: Diagnosis not present

## 2012-01-04 DIAGNOSIS — E1159 Type 2 diabetes mellitus with other circulatory complications: Secondary | ICD-10-CM | POA: Diagnosis not present

## 2012-01-04 DIAGNOSIS — I10 Essential (primary) hypertension: Secondary | ICD-10-CM | POA: Diagnosis not present

## 2012-01-09 DIAGNOSIS — F329 Major depressive disorder, single episode, unspecified: Secondary | ICD-10-CM | POA: Diagnosis not present

## 2012-01-09 DIAGNOSIS — I10 Essential (primary) hypertension: Secondary | ICD-10-CM | POA: Diagnosis not present

## 2012-01-09 DIAGNOSIS — E1159 Type 2 diabetes mellitus with other circulatory complications: Secondary | ICD-10-CM | POA: Diagnosis not present

## 2012-01-09 DIAGNOSIS — R609 Edema, unspecified: Secondary | ICD-10-CM | POA: Diagnosis not present

## 2012-01-09 DIAGNOSIS — F3289 Other specified depressive episodes: Secondary | ICD-10-CM | POA: Diagnosis not present

## 2012-02-02 ENCOUNTER — Ambulatory Visit (HOSPITAL_BASED_OUTPATIENT_CLINIC_OR_DEPARTMENT_OTHER): Payer: Medicare Other | Admitting: Oncology

## 2012-02-02 ENCOUNTER — Other Ambulatory Visit (HOSPITAL_BASED_OUTPATIENT_CLINIC_OR_DEPARTMENT_OTHER): Payer: Medicare Other | Admitting: Lab

## 2012-02-02 VITALS — BP 130/55 | HR 99 | Temp 98.6°F | Ht 66.0 in | Wt 301.0 lb

## 2012-02-02 DIAGNOSIS — E119 Type 2 diabetes mellitus without complications: Secondary | ICD-10-CM

## 2012-02-02 DIAGNOSIS — D720 Genetic anomalies of leukocytes: Secondary | ICD-10-CM

## 2012-02-02 DIAGNOSIS — IMO0001 Reserved for inherently not codable concepts without codable children: Secondary | ICD-10-CM

## 2012-02-02 DIAGNOSIS — I10 Essential (primary) hypertension: Secondary | ICD-10-CM

## 2012-02-02 DIAGNOSIS — D72829 Elevated white blood cell count, unspecified: Secondary | ICD-10-CM

## 2012-02-02 DIAGNOSIS — D729 Disorder of white blood cells, unspecified: Secondary | ICD-10-CM

## 2012-02-02 LAB — CBC WITH DIFFERENTIAL/PLATELET
BASO%: 0.8 % (ref 0.0–2.0)
EOS%: 2.6 % (ref 0.0–7.0)
HCT: 39.4 % (ref 34.8–46.6)
LYMPH%: 14.4 % (ref 14.0–49.7)
MCH: 27.7 pg (ref 25.1–34.0)
MCHC: 32 g/dL (ref 31.5–36.0)
MCV: 86.4 fL (ref 79.5–101.0)
MONO%: 7 % (ref 0.0–14.0)
NEUT%: 75.2 % (ref 38.4–76.8)
Platelets: 324 10*3/uL (ref 145–400)

## 2012-02-02 NOTE — Progress Notes (Signed)
    [facility]    OFFICE PROGRESS NOTE   INTERVAL HISTORY:   She returns as scheduled. She is taking methotrexate and Plaquenil for rheumatoid arthritis. She reports a recent episode of "bronchitis ". She underwent a negative chest x-ray and CT evaluation in February of 2013.  She reports no steroid use. She has pain at multiple joints sites.  Objective:  Vital signs in last 24 hours:  Blood pressure 130/55, pulse 99, temperature 98.6 F (37 C), temperature source Oral, height 5\' 6"  (1.676 m), weight 301 lb (136.533 kg).    HEENT: No thrush or ulcers, neck without mass Lymphatics: No cervical or supraclavicular nodes Resp: Lungs clear bilaterally Cardio: Regular rate and rhythm GI: No hepatosplenomegaly Vascular: The left lower leg is slightly larger than the right side, no erythema or tenderness   Lab Results:  Lab Results  Component Value Date   WBC 15.6* 02/02/2012   HGB 12.6 02/02/2012   HCT 39.4 02/02/2012   MCV 86.4 02/02/2012   PLT 324 02/02/2012   ANC 11.7, absolute monocyte count 1.1    Medications: I have reviewed the patient's current medications.  Assessment/Plan: 1. Chronic mild neutrophilia. Negative peripheral blood PCR for BCR/ABL and the JAK-2 mutation in September of 2012 2. Mild elevation of the peripheral monocyte count -chronic 3. Fibromyalgia. 4. Diabetes. 5. History of hypertension. 6. Status post resection of a thymoma in 2002. 7. History of Guillain-Barre syndrome. 8. "Arthritis."  She is now taking methotrexate and Plaquenil for rheumatoid arthritis    Disposition:  She is stable from a hematologic standpoint. The chronic neutrophilia could be related to a systemic inflammatory condition, polypharmacy, or less likely an underlying myeloproliferative disorder. Testing for CML was negative in September of 2012.  She would like to continue clinical followup with Drs. Reed and [employer]. We did not schedule a followup  appointment in the hematology clinic. I will be glad to see her again if the white count rise is or she develops another hematologic abnormality.   Thornton Papas, MD  02/02/2012  1:29 PM

## 2012-03-02 DIAGNOSIS — M25569 Pain in unspecified knee: Secondary | ICD-10-CM | POA: Diagnosis not present

## 2012-03-02 DIAGNOSIS — M069 Rheumatoid arthritis, unspecified: Secondary | ICD-10-CM | POA: Diagnosis not present

## 2012-03-02 DIAGNOSIS — M25549 Pain in joints of unspecified hand: Secondary | ICD-10-CM | POA: Diagnosis not present

## 2012-03-02 DIAGNOSIS — M3500 Sicca syndrome, unspecified: Secondary | ICD-10-CM | POA: Diagnosis not present

## 2012-03-15 DIAGNOSIS — I10 Essential (primary) hypertension: Secondary | ICD-10-CM | POA: Diagnosis not present

## 2012-03-15 DIAGNOSIS — E039 Hypothyroidism, unspecified: Secondary | ICD-10-CM | POA: Diagnosis not present

## 2012-03-15 DIAGNOSIS — G894 Chronic pain syndrome: Secondary | ICD-10-CM | POA: Diagnosis not present

## 2012-03-15 DIAGNOSIS — J42 Unspecified chronic bronchitis: Secondary | ICD-10-CM | POA: Diagnosis not present

## 2012-03-19 ENCOUNTER — Telehealth: Payer: Self-pay | Admitting: Internal Medicine

## 2012-03-19 NOTE — Telephone Encounter (Signed)
Forward 5 pages to Dr. Lina Sar for review on 03-19-12 ym

## 2012-04-04 DIAGNOSIS — G890 Central pain syndrome: Secondary | ICD-10-CM | POA: Diagnosis not present

## 2012-04-04 DIAGNOSIS — M069 Rheumatoid arthritis, unspecified: Secondary | ICD-10-CM | POA: Diagnosis not present

## 2012-04-04 DIAGNOSIS — G894 Chronic pain syndrome: Secondary | ICD-10-CM | POA: Diagnosis not present

## 2012-04-04 DIAGNOSIS — I10 Essential (primary) hypertension: Secondary | ICD-10-CM | POA: Diagnosis not present

## 2012-05-17 DIAGNOSIS — E039 Hypothyroidism, unspecified: Secondary | ICD-10-CM | POA: Diagnosis not present

## 2012-05-17 DIAGNOSIS — R5381 Other malaise: Secondary | ICD-10-CM | POA: Diagnosis not present

## 2012-05-17 DIAGNOSIS — R5383 Other fatigue: Secondary | ICD-10-CM | POA: Diagnosis not present

## 2012-05-17 DIAGNOSIS — I10 Essential (primary) hypertension: Secondary | ICD-10-CM | POA: Diagnosis not present

## 2012-05-17 DIAGNOSIS — E1159 Type 2 diabetes mellitus with other circulatory complications: Secondary | ICD-10-CM | POA: Diagnosis not present

## 2012-05-21 DIAGNOSIS — J209 Acute bronchitis, unspecified: Secondary | ICD-10-CM | POA: Diagnosis not present

## 2012-05-21 DIAGNOSIS — E039 Hypothyroidism, unspecified: Secondary | ICD-10-CM | POA: Diagnosis not present

## 2012-05-21 DIAGNOSIS — I10 Essential (primary) hypertension: Secondary | ICD-10-CM | POA: Diagnosis not present

## 2012-05-21 DIAGNOSIS — E1159 Type 2 diabetes mellitus with other circulatory complications: Secondary | ICD-10-CM | POA: Diagnosis not present

## 2012-05-21 DIAGNOSIS — J440 Chronic obstructive pulmonary disease with acute lower respiratory infection: Secondary | ICD-10-CM | POA: Diagnosis not present

## 2012-06-21 DIAGNOSIS — M25549 Pain in joints of unspecified hand: Secondary | ICD-10-CM | POA: Diagnosis not present

## 2012-06-21 DIAGNOSIS — M069 Rheumatoid arthritis, unspecified: Secondary | ICD-10-CM | POA: Diagnosis not present

## 2012-06-21 DIAGNOSIS — M25469 Effusion, unspecified knee: Secondary | ICD-10-CM | POA: Diagnosis not present

## 2012-06-21 DIAGNOSIS — M899 Disorder of bone, unspecified: Secondary | ICD-10-CM | POA: Diagnosis not present

## 2012-06-21 DIAGNOSIS — M25569 Pain in unspecified knee: Secondary | ICD-10-CM | POA: Diagnosis not present

## 2012-06-21 DIAGNOSIS — IMO0002 Reserved for concepts with insufficient information to code with codable children: Secondary | ICD-10-CM | POA: Diagnosis not present

## 2012-07-04 DIAGNOSIS — M25569 Pain in unspecified knee: Secondary | ICD-10-CM | POA: Diagnosis not present

## 2012-07-04 DIAGNOSIS — M3500 Sicca syndrome, unspecified: Secondary | ICD-10-CM | POA: Diagnosis not present

## 2012-07-04 DIAGNOSIS — IMO0001 Reserved for inherently not codable concepts without codable children: Secondary | ICD-10-CM | POA: Diagnosis not present

## 2012-07-04 DIAGNOSIS — M069 Rheumatoid arthritis, unspecified: Secondary | ICD-10-CM | POA: Diagnosis not present

## 2012-07-11 DIAGNOSIS — E119 Type 2 diabetes mellitus without complications: Secondary | ICD-10-CM | POA: Diagnosis not present

## 2012-07-25 DIAGNOSIS — M25569 Pain in unspecified knee: Secondary | ICD-10-CM | POA: Diagnosis not present

## 2012-07-25 DIAGNOSIS — M3500 Sicca syndrome, unspecified: Secondary | ICD-10-CM | POA: Diagnosis not present

## 2012-07-25 DIAGNOSIS — M069 Rheumatoid arthritis, unspecified: Secondary | ICD-10-CM | POA: Diagnosis not present

## 2012-09-04 DIAGNOSIS — I10 Essential (primary) hypertension: Secondary | ICD-10-CM | POA: Diagnosis not present

## 2012-09-04 DIAGNOSIS — F329 Major depressive disorder, single episode, unspecified: Secondary | ICD-10-CM | POA: Diagnosis not present

## 2012-09-04 DIAGNOSIS — E1159 Type 2 diabetes mellitus with other circulatory complications: Secondary | ICD-10-CM | POA: Diagnosis not present

## 2012-09-04 DIAGNOSIS — F3289 Other specified depressive episodes: Secondary | ICD-10-CM | POA: Diagnosis not present

## 2012-09-04 DIAGNOSIS — D7289 Other specified disorders of white blood cells: Secondary | ICD-10-CM | POA: Diagnosis not present

## 2012-09-06 DIAGNOSIS — G890 Central pain syndrome: Secondary | ICD-10-CM | POA: Diagnosis not present

## 2012-09-06 DIAGNOSIS — R609 Edema, unspecified: Secondary | ICD-10-CM | POA: Diagnosis not present

## 2012-09-06 DIAGNOSIS — E039 Hypothyroidism, unspecified: Secondary | ICD-10-CM | POA: Diagnosis not present

## 2012-09-06 DIAGNOSIS — Z79899 Other long term (current) drug therapy: Secondary | ICD-10-CM | POA: Diagnosis not present

## 2012-09-06 DIAGNOSIS — E1159 Type 2 diabetes mellitus with other circulatory complications: Secondary | ICD-10-CM | POA: Diagnosis not present

## 2012-09-06 DIAGNOSIS — M069 Rheumatoid arthritis, unspecified: Secondary | ICD-10-CM | POA: Diagnosis not present

## 2012-09-06 DIAGNOSIS — I10 Essential (primary) hypertension: Secondary | ICD-10-CM | POA: Diagnosis not present

## 2012-09-17 DIAGNOSIS — Z111 Encounter for screening for respiratory tuberculosis: Secondary | ICD-10-CM | POA: Diagnosis not present

## 2012-09-17 DIAGNOSIS — M25549 Pain in joints of unspecified hand: Secondary | ICD-10-CM | POA: Diagnosis not present

## 2012-09-17 DIAGNOSIS — M069 Rheumatoid arthritis, unspecified: Secondary | ICD-10-CM | POA: Diagnosis not present

## 2012-09-17 DIAGNOSIS — IMO0001 Reserved for inherently not codable concepts without codable children: Secondary | ICD-10-CM | POA: Diagnosis not present

## 2012-09-20 DIAGNOSIS — I10 Essential (primary) hypertension: Secondary | ICD-10-CM | POA: Diagnosis not present

## 2012-09-20 DIAGNOSIS — N390 Urinary tract infection, site not specified: Secondary | ICD-10-CM | POA: Diagnosis not present

## 2012-09-24 DIAGNOSIS — M069 Rheumatoid arthritis, unspecified: Secondary | ICD-10-CM | POA: Diagnosis not present

## 2012-10-01 ENCOUNTER — Telehealth: Payer: Self-pay | Admitting: Oncology

## 2012-10-01 ENCOUNTER — Other Ambulatory Visit: Payer: Self-pay | Admitting: *Deleted

## 2012-10-01 DIAGNOSIS — D509 Iron deficiency anemia, unspecified: Secondary | ICD-10-CM

## 2012-10-01 NOTE — Progress Notes (Signed)
CBC results received from Dr. Fatima Sanger office and based on [facility] and [facility] Dr. Truett Perna wants patient to come in for CBC and ferritin check. POF to scheduler.

## 2012-10-01 NOTE — Telephone Encounter (Signed)
S/w the pt and she is aware of her lab appt on Thursday 10/04/2012

## 2012-10-04 ENCOUNTER — Other Ambulatory Visit: Payer: Medicare Other | Admitting: Lab

## 2012-10-05 ENCOUNTER — Encounter (HOSPITAL_COMMUNITY): Payer: Self-pay

## 2012-10-05 ENCOUNTER — Emergency Department (HOSPITAL_COMMUNITY)
Admission: EM | Admit: 2012-10-05 | Discharge: 2012-10-05 | Disposition: A | Discharge status: Home / Self Care | Payer: Medicare Other | Attending: Emergency Medicine | Admitting: Emergency Medicine

## 2012-10-05 ENCOUNTER — Emergency Department (HOSPITAL_COMMUNITY): Payer: Medicare Other

## 2012-10-05 DIAGNOSIS — Z8669 Personal history of other diseases of the nervous system and sense organs: Secondary | ICD-10-CM | POA: Diagnosis not present

## 2012-10-05 DIAGNOSIS — M51379 Other intervertebral disc degeneration, lumbosacral region without mention of lumbar back pain or lower extremity pain: Secondary | ICD-10-CM | POA: Diagnosis not present

## 2012-10-05 DIAGNOSIS — E079 Disorder of thyroid, unspecified: Secondary | ICD-10-CM | POA: Insufficient documentation

## 2012-10-05 DIAGNOSIS — R262 Difficulty in walking, not elsewhere classified: Secondary | ICD-10-CM | POA: Diagnosis not present

## 2012-10-05 DIAGNOSIS — M47817 Spondylosis without myelopathy or radiculopathy, lumbosacral region: Secondary | ICD-10-CM | POA: Diagnosis not present

## 2012-10-05 DIAGNOSIS — M5137 Other intervertebral disc degeneration, lumbosacral region: Secondary | ICD-10-CM | POA: Diagnosis not present

## 2012-10-05 DIAGNOSIS — R209 Unspecified disturbances of skin sensation: Secondary | ICD-10-CM | POA: Insufficient documentation

## 2012-10-05 DIAGNOSIS — Z794 Long term (current) use of insulin: Secondary | ICD-10-CM | POA: Diagnosis not present

## 2012-10-05 DIAGNOSIS — R6889 Other general symptoms and signs: Secondary | ICD-10-CM | POA: Diagnosis not present

## 2012-10-05 DIAGNOSIS — J4489 Other specified chronic obstructive pulmonary disease: Secondary | ICD-10-CM | POA: Diagnosis not present

## 2012-10-05 DIAGNOSIS — Z79899 Other long term (current) drug therapy: Secondary | ICD-10-CM | POA: Diagnosis not present

## 2012-10-05 DIAGNOSIS — M1990 Unspecified osteoarthritis, unspecified site: Secondary | ICD-10-CM | POA: Diagnosis not present

## 2012-10-05 DIAGNOSIS — E669 Obesity, unspecified: Secondary | ICD-10-CM | POA: Diagnosis not present

## 2012-10-05 DIAGNOSIS — Z8739 Personal history of other diseases of the musculoskeletal system and connective tissue: Secondary | ICD-10-CM | POA: Insufficient documentation

## 2012-10-05 DIAGNOSIS — M79609 Pain in unspecified limb: Secondary | ICD-10-CM | POA: Diagnosis not present

## 2012-10-05 DIAGNOSIS — J449 Chronic obstructive pulmonary disease, unspecified: Secondary | ICD-10-CM | POA: Insufficient documentation

## 2012-10-05 DIAGNOSIS — I10 Essential (primary) hypertension: Secondary | ICD-10-CM | POA: Insufficient documentation

## 2012-10-05 DIAGNOSIS — M4716 Other spondylosis with myelopathy, lumbar region: Secondary | ICD-10-CM | POA: Diagnosis not present

## 2012-10-05 DIAGNOSIS — Z87891 Personal history of nicotine dependence: Secondary | ICD-10-CM | POA: Insufficient documentation

## 2012-10-05 DIAGNOSIS — M479 Spondylosis, unspecified: Secondary | ICD-10-CM

## 2012-10-05 MED ORDER — FENTANYL 25 MCG/HR TD PT72: 1.0000 | MEDICATED_PATCH | TRANSDERMAL | Status: DC

## 2012-10-05 MED ORDER — FENTANYL 25 MCG/HR TD PT72
25.0000 ug | MEDICATED_PATCH | TRANSDERMAL | Status: DC
  Administered 2012-10-05: 25 ug via TRANSDERMAL
  Filled 2012-10-05: qty 1

## 2012-10-05 MED ORDER — KETOROLAC TROMETHAMINE 30 MG/ML IJ SOLN
30.0000 mg | Freq: Once | INTRAMUSCULAR | Status: AC
  Administered 2012-10-05: 30 mg via INTRAVENOUS
  Filled 2012-10-05: qty 1

## 2012-10-05 MED ORDER — OXYCODONE HCL 5 MG PO CAPS: 10.0000 mg | ORAL_CAPSULE | ORAL | Status: DC | PRN

## 2012-10-05 MED ORDER — ONDANSETRON HCL 4 MG/2ML IJ SOLN
4.0000 mg | Freq: Once | INTRAMUSCULAR | Status: AC
  Administered 2012-10-05: 4 mg via INTRAVENOUS
  Filled 2012-10-05: qty 2

## 2012-10-05 MED ORDER — HYDROCODONE-ACETAMINOPHEN 10-500 MG PO TABS: 1.0000 | ORAL_TABLET | Freq: Four times a day (QID) | ORAL | Status: DC | PRN

## 2012-10-05 MED ORDER — HYDROMORPHONE HCL PF 1 MG/ML IJ SOLN
1.0000 mg | Freq: Once | INTRAMUSCULAR | Status: AC
  Administered 2012-10-05: 1 mg via INTRAVENOUS
  Filled 2012-10-05: qty 1

## 2012-10-05 NOTE — ED Provider Notes (Signed)
History     CSN: [id number]  Arrival date & time 10/05/12  1315   First MD Initiated Contact with Patient 10/05/12 1333      Chief Complaint  Patient presents with  . Leg Pain  . Back Pain     HPI Patient presents from home with bilateral leg numbness and pain x 2 weeks with worsening numbness, no signs of infection/swelling. Negative stroke scale per EMS. Patient alert and oriented x3 able to stand on legs for short period of time.  No bowel or bladder incontinence.  Past Medical History  Diagnosis Date  . COPD (chronic obstructive pulmonary disease)   . Hypertension   . RA (rheumatoid arthritis)   . Thyroid disease   . Guillain-Barre     Past Surgical History  Procedure Laterality Date  . Abdominal hysterectomy    . Tonsillectomy    . Cholecystectomy    . Back surgery      No family history on file.  History  Substance Use Topics  . Smoking status: Former Games developer  . Smokeless tobacco: Not on file  . Alcohol Use: Yes    OB History   Grav Para Term Preterm Abortions TAB SAB Ect Mult Living                  Review of Systems All other systems revie Allergies  Penicillins and Sulfamethoxazole w-trimethoprim  Home Medications   Current Outpatient Rx  Name  Route  Sig  Dispense  Refill  . amitriptyline (ELAVIL) 25 MG tablet   Oral   Take 25 mg by mouth at bedtime as needed.          Marland Kitchen aspirin EC 81 MG tablet   Oral   Take 81 mg by mouth daily as needed.          . beclomethasone (QVAR) 80 MCG/ACT inhaler   Inhalation   Inhale 1 puff into the lungs 2 (two) times daily as needed. For wheezing         . Cholecalciferol (VITAMIN D-3) 5000 UNITS TABS   Oral   Take 1 tablet by mouth daily.         Marland Kitchen doxycycline (VIBRA-TABS) 100 MG tablet   Oral   Take 100 mg by mouth daily.         . furosemide (LASIX) 40 MG tablet   Oral   Take 40 mg by mouth daily as needed (for fluid= takes with potassium).          Marland Kitchen gabapentin (NEURONTIN) 300 MG  capsule   Oral   Take 300 mg by mouth 2 (two) times daily as needed (for pain). For pain.         Marland Kitchen insulin aspart (NOVOLOG) 100 UNIT/ML injection   Subcutaneous   Inject 6-10 Units into the skin 3 (three) times daily before meals. Sliding scale         . insulin glargine (LANTUS) 100 UNIT/ML injection   Subcutaneous   Inject 70 Units into the skin at bedtime.         Marland Kitchen levothyroxine (SYNTHROID, LEVOTHROID) 200 MCG tablet   Oral   Take 200 mcg by mouth daily.         Marland Kitchen lisinopril (PRINIVIL,ZESTRIL) 40 MG tablet   Oral   Take 40 mg by mouth daily.         Marland Kitchen Nystatin (NYAMYC) 100000 UNIT/GM POWD   Apply externally   Apply 1 g topically daily as needed (  for itching).          Marland Kitchen omega-3 acid ethyl esters (LOVAZA) 1 G capsule   Oral   Take 1 g by mouth daily.          . polyvinyl alcohol (LIQUIFILM TEARS) 1.4 % ophthalmic solution   Both Eyes   Place 1 drop into both eyes as needed (for dry eyes).          . potassium chloride (K-DUR) 10 MEQ tablet   Oral   Take 20 mEq by mouth daily as needed (for fluid.  takes with furosemide).          Marland Kitchen tiotropium (SPIRIVA) 18 MCG inhalation capsule   Inhalation   Place 18 mcg into inhaler and inhale daily.         Marland Kitchen triamterene-hydrochlorothiazide (MAXZIDE-25) 37.5-25 MG per tablet   Oral   Take 1 tablet by mouth daily.         . fentaNYL (DURAGESIC - DOSED MCG/HR) 25 MCG/HR   Transdermal   Place 1 patch (25 mcg total) onto the skin every 3 (three) days.   5 patch   0   . HYDROcodone-acetaminophen (LORTAB) 10-500 MG per tablet   Oral   Take 1 tablet by mouth every 6 (six) hours as needed. For pain   30 tablet   0   . oxycodone (OXY-IR) 5 MG capsule   Oral   Take 2 capsules (10 mg total) by mouth every 4 (four) hours as needed for pain.   30 capsule   0     BP 128/45  Pulse 89  Temp(Src) 98.6 F (37 C) (Oral)  SpO2 97%  Physical Exam  Nursing note and vitals reviewed. Constitutional: She is  oriented to person, place, and time. She appears well-developed and well-nourished. No distress.  Obese  HENT:  Head: Normocephalic and atraumatic.  Eyes: Pupils are equal, round, and reactive to light.  Neck: Normal range of motion.  Cardiovascular: Normal rate and intact distal pulses.   Pulses:      Dorsalis pedis pulses are 1+ on the right side, and 1+ on the left side.       Posterior tibial pulses are 1+ on the right side, and 1+ on the left side.  Pulmonary/Chest: No respiratory distress.  Abdominal: Normal appearance. She exhibits no distension.  Musculoskeletal: Normal range of motion.       Lumbar back: She exhibits tenderness. She exhibits no swelling and no edema.  Neurological: She is alert and oriented to person, place, and time. No cranial nerve deficit.  Skin: Skin is warm and dry. No rash noted.  Psychiatric: She has a normal mood and affect. Her behavior is normal.    ED Course  Procedures (including critical care time)  Labs Reviewed - No data to display Dg Lumbar Spine Complete  10/05/2012  *RADIOLOGY REPORT*  Clinical Data: Low back pain.  Inability to walk.  LUMBAR SPINE - COMPLETE 4+ VIEW  Comparison: None.  Findings: There are five non-rib bearing lumbar-type vertebral bodies.  There is slight scoliosis convexity to the right.  There is narrowing of the intervertebral disc space at the level of L5-S1 .  The disc space at L5-S1 is not well defined and element of fusion cannot be excluded.  No fracture or bony destruction is seen.  No pars defect is evident.  There is slight facet joint osteoarthropathy.  SI joints appear intact.  Pelvic phleboliths are seen.  Surgical clips are seen  to the right of the L2 and L3 vertebral bodies on the AP image.  Nonaneurysmal calcifications of abdominal aorta and its branches are seen.  IMPRESSION: Changes of degenerative disc disease and degenerative spondylosis.  There is slight scoliosis convexity to the right.  There is narrowing  of the intervertebral disc space at the level of L5-S1 . The disc space at L5-S1 is not well defined and element of fusion cannot be excluded.   Original Report Authenticated By: Onalee Hua Call      1. Degenerative joint disease of low back       MDM  Plan: We'll add fentanyl patch for pain management.  Patient would benefit from MRI evaluation of her lumbar spine but she is unable to do an MRI versus an open scanner.  She is going to follow up with her primary care Dr. for ordering of an open scanner MRI.       Nelia Shi, MD 10/05/12 [phone]

## 2012-10-05 NOTE — ED Notes (Signed)
Patient transported to X-ray 

## 2012-10-05 NOTE — ED Notes (Signed)
Patient presents from home with bilateral leg numbness and pain x 2 weeks with worsening numbness, no signs of infection/swelling.  Negative stroke scale per EMS. Patient alert and oriented x3 able to stand on legs for short period of time.

## 2012-10-05 NOTE — ED Notes (Signed)
Pt drowsy after pain medications. Placed on 3L O2 [place]

## 2012-10-09 DIAGNOSIS — M48061 Spinal stenosis, lumbar region without neurogenic claudication: Secondary | ICD-10-CM | POA: Diagnosis not present

## 2012-10-09 DIAGNOSIS — N390 Urinary tract infection, site not specified: Secondary | ICD-10-CM | POA: Diagnosis not present

## 2012-10-09 DIAGNOSIS — G890 Central pain syndrome: Secondary | ICD-10-CM | POA: Diagnosis not present

## 2012-10-11 ENCOUNTER — Other Ambulatory Visit: Payer: Self-pay | Admitting: Internal Medicine

## 2012-10-11 DIAGNOSIS — M545 Low back pain, unspecified: Secondary | ICD-10-CM

## 2012-10-19 ENCOUNTER — Ambulatory Visit
Admission: RE | Admit: 2012-10-19 | Discharge: 2012-10-19 | Disposition: A | Discharge status: Home / Self Care | Payer: Medicare Other | Source: Ambulatory Visit | Attending: Internal Medicine | Admitting: Internal Medicine

## 2012-10-19 DIAGNOSIS — M51379 Other intervertebral disc degeneration, lumbosacral region without mention of lumbar back pain or lower extremity pain: Secondary | ICD-10-CM | POA: Diagnosis not present

## 2012-10-19 DIAGNOSIS — M48061 Spinal stenosis, lumbar region without neurogenic claudication: Secondary | ICD-10-CM | POA: Diagnosis not present

## 2012-10-19 DIAGNOSIS — M545 Low back pain, unspecified: Secondary | ICD-10-CM

## 2012-10-19 DIAGNOSIS — M5126 Other intervertebral disc displacement, lumbar region: Secondary | ICD-10-CM | POA: Diagnosis not present

## 2012-10-19 DIAGNOSIS — M5137 Other intervertebral disc degeneration, lumbosacral region: Secondary | ICD-10-CM | POA: Diagnosis not present

## 2012-10-24 DIAGNOSIS — M51379 Other intervertebral disc degeneration, lumbosacral region without mention of lumbar back pain or lower extremity pain: Secondary | ICD-10-CM | POA: Diagnosis not present

## 2012-10-24 DIAGNOSIS — Z09 Encounter for follow-up examination after completed treatment for conditions other than malignant neoplasm: Secondary | ICD-10-CM | POA: Diagnosis not present

## 2012-10-24 DIAGNOSIS — M2550 Pain in unspecified joint: Secondary | ICD-10-CM | POA: Diagnosis not present

## 2012-10-24 DIAGNOSIS — M069 Rheumatoid arthritis, unspecified: Secondary | ICD-10-CM | POA: Diagnosis not present

## 2012-10-24 DIAGNOSIS — M5137 Other intervertebral disc degeneration, lumbosacral region: Secondary | ICD-10-CM | POA: Diagnosis not present

## 2012-11-07 ENCOUNTER — Other Ambulatory Visit: Payer: Self-pay | Admitting: *Deleted

## 2012-11-07 MED ORDER — OXYCODONE HCL 5 MG PO TABS: ORAL_TABLET | ORAL | Status: DC

## 2012-11-07 MED ORDER — HYDROCODONE-ACETAMINOPHEN 10-325 MG PO TABS: ORAL_TABLET | ORAL | Status: DC

## 2012-11-16 ENCOUNTER — Other Ambulatory Visit: Payer: Self-pay | Admitting: *Deleted

## 2012-11-16 DIAGNOSIS — E039 Hypothyroidism, unspecified: Secondary | ICD-10-CM

## 2012-11-16 DIAGNOSIS — I10 Essential (primary) hypertension: Secondary | ICD-10-CM

## 2012-11-16 DIAGNOSIS — R609 Edema, unspecified: Secondary | ICD-10-CM

## 2012-11-16 DIAGNOSIS — E1159 Type 2 diabetes mellitus with other circulatory complications: Secondary | ICD-10-CM

## 2012-11-23 ENCOUNTER — Encounter: Payer: Self-pay | Admitting: Neurology

## 2012-11-23 ENCOUNTER — Ambulatory Visit (INDEPENDENT_AMBULATORY_CARE_PROVIDER_SITE_OTHER): Payer: Medicare Other | Admitting: Neurology

## 2012-11-23 VITALS — BP 169/80 | HR 105 | Ht 66.0 in | Wt 282.0 lb

## 2012-11-23 DIAGNOSIS — D518 Other vitamin B12 deficiency anemias: Secondary | ICD-10-CM

## 2012-11-23 DIAGNOSIS — E1142 Type 2 diabetes mellitus with diabetic polyneuropathy: Secondary | ICD-10-CM

## 2012-11-23 DIAGNOSIS — R5381 Other malaise: Secondary | ICD-10-CM

## 2012-11-23 DIAGNOSIS — R269 Unspecified abnormalities of gait and mobility: Secondary | ICD-10-CM

## 2012-11-23 DIAGNOSIS — R531 Weakness: Secondary | ICD-10-CM

## 2012-11-23 NOTE — Progress Notes (Signed)
Reason for visit: Weakness  Katherine Delgado is a 72 y.o. female  History of present illness:  Katherine Delgado is a 72 year old right-handed white female with a history of significant obesity, degenerative arthritis, rheumatoid arthritis, diabetes, and a diabetic peripheral neuropathy. The patient also has lumbosacral spondylosis, and she has chronic low back pain, and discomfort down the left and right leg. The patient has a prior history of Guillain-Barre syndrome in 2002. The patient had been recovered from this. The patient is being treated by Dr. Corliss Skains for her rheumatoid arthritis. The patient has had episodes of increased discomfort in the joints of the legs and arms, left shoulder pain. At times, the patient has had to use a wheelchair for mobilization. The patient feels as if her legs are somewhat weak, but she also has a lot of discomfort with weightbearing. The patient reports numbness in the feet, burning in the feet at night associated with a diabetic peripheral neuropathy. The patient currently is using a quad cane for ambulation, and she reports no recent falls. The patient has difficulty sleeping secondary to discomfort in the feet. The patient will occasionally have some incontinence of the bladder, and diarrhea. The patient reports neck pain and shoulder pain as well. The patient is sent to this office for further evaluation of her weakness and discomfort. MRI evaluation of the lumbosacral spine shows multilevel facet joint arthritis with neuroforaminal stenosis at multiple levels. No significant spinal stenosis has been noted. The patient has a history of prior lumbosacral spine surgery at the L5-S1 level.  Past Medical History  Diagnosis Date  . COPD (chronic obstructive pulmonary disease)   . Hypertension   . RA (rheumatoid arthritis)   . Thyroid disease   . Miscarriage 1962  . History of benign thymus tumor   . Back injury   . GERD (gastroesophageal reflux disease)   .  Arthritis   . Fibromyalgia   . Diabetes mellitus without complication   . Thyroid disorder   . Acute infective polyneuritis 2002  . Guillain-Barre syndrome   . Polyneuropathy in diabetes(357.2)   . Morbid obesity   . Spondylosis, lumbosacral   . Degenerative arthritis   . Dyslipidemia     Past Surgical History  Procedure Laterality Date  . Abdominal hysterectomy    . Tonsillectomy    . Cholecystectomy    . Back surgery    . Ovarian cyst surgery  1968  . Thymus tumor    . Knee surgery Bilateral     Family History  Problem Relation Age of Onset  . Alzheimer's disease Mother   . Heart disease Mother   . Heart disease Father   . Liver disease Father   . Diabetes Brother     Social history:  reports that she has quit smoking. She does not have any smokeless tobacco history on file. She reports that  drinks alcohol. She reports that she does not use illicit drugs.  Medications:  Current Outpatient Prescriptions on File Prior to Visit  Medication Sig Dispense Refill  . aspirin EC 81 MG tablet Take 81 mg by mouth daily as needed.       . beclomethasone (QVAR) 80 MCG/ACT inhaler Inhale 1 puff into the lungs 2 (two) times daily as needed. For wheezing      . Cholecalciferol (VITAMIN D-3) 5000 UNITS TABS Take 1 tablet by mouth daily.      . furosemide (LASIX) 40 MG tablet Take 40 mg by mouth daily as needed (  for fluid= takes with potassium).       Marland Kitchen gabapentin (NEURONTIN) 300 MG capsule Take 300 mg by mouth 2 (two) times daily as needed (for pain). For pain.      Marland Kitchen HYDROcodone-acetaminophen (NORCO) 10-325 MG per tablet Take one tablet evert 3 hours as needed for pain  240 tablet  0  . insulin aspart (NOVOLOG) 100 UNIT/ML injection Inject 6-10 Units into the skin 3 (three) times daily before meals. Sliding scale      . insulin glargine (LANTUS) 100 UNIT/ML injection Inject 70 Units into the skin at bedtime.      Marland Kitchen levothyroxine (SYNTHROID, LEVOTHROID) 200 MCG tablet Take 200 mcg by  mouth daily.      Marland Kitchen Nystatin (NYAMYC) 100000 UNIT/GM POWD Apply 1 g topically daily as needed (for itching).       Marland Kitchen omega-3 acid ethyl esters (LOVAZA) 1 G capsule Take 1 g by mouth daily.       Marland Kitchen oxyCODONE (OXY IR/ROXICODONE) 5 MG immediate release tablet Take one tablet every 6 hours as needed for severe pain, Take 2 tablets at bedtime.  180 tablet  0  . polyvinyl alcohol (LIQUIFILM TEARS) 1.4 % ophthalmic solution Place 1 drop into both eyes as needed (for dry eyes).       . potassium chloride (K-DUR) 10 MEQ tablet Take 20 mEq by mouth daily as needed (for fluid.  takes with furosemide).       Marland Kitchen tiotropium (SPIRIVA) 18 MCG inhalation capsule Place 18 mcg into inhaler and inhale daily.      Marland Kitchen triamterene-hydrochlorothiazide (MAXZIDE-25) 37.5-25 MG per tablet Take 1 tablet by mouth daily.       No current facility-administered medications on file prior to visit.    Allergies:  Allergies  Allergen Reactions  . Penicillins     REACTION: Reaction not known  . Sulfamethoxazole W-Trimethoprim     REACTION: Reaction not known    ROS:  Out of a complete 14 system review of symptoms, the patient complains only of the following symptoms, and all other reviewed systems are negative.  Weight gain, fatigue Swelling in the legs Cough, wheezing Diarrhea Urinary incontinence Increased thirst Joint pain, joint swelling, achy muscles Headache Weakness Insomnia, sleepiness, decreased energy Decreased appetite Restless legs  Blood pressure 169/80, pulse 105, height 5\' 6"  (1.676 m), weight 282 lb (127.914 kg).  Physical Exam  General: The patient is alert and cooperative at the time of the examination. The patient is morbidly obese.  Head: Pupils are equal, round, and reactive to light. Discs are flat bilaterally.  Neck: The neck is supple, no carotid bruits are noted.  Respiratory: The respiratory examination is clear.  Cardiovascular: The cardiovascular examination reveals a regular  rate and rhythm, no obvious murmurs or rubs are noted.  Skin: Extremities are without significant edema.  Neurologic Exam  Mental status:  Cranial nerves: Facial symmetry is present. There is good sensation of the face to pinprick and soft touch bilaterally. The strength of the facial muscles and the muscles to head turning and shoulder shrug are normal bilaterally. Speech is well enunciated, no aphasia or dysarthria is noted. Extraocular movements are full. Visual fields are full.  Motor: The motor testing reveals 5 over 5 strength of all 4 extremities. Good symmetric motor tone is noted throughout. The patient is able to arise from a seated position with arms crossed.  Sensory: Sensory testing is intact to pinprick, soft touch, vibration sensation, and position sense on all  4 extremities, with the exception that there is a pinprick sensory deficit one half way up the legs below the knees. No evidence of extinction is noted.  Coordination: Cerebellar testing reveals good finger-nose-finger and heel-to-shin bilaterally.  Gait and station: Gait is slightly wide-based, the patient uses a cane for ambulation. Tandem gait is poor. Romberg is negative. No drift is seen  Reflexes: Deep tendon reflexes are symmetric, but are depressed bilaterally. Toes are downgoing bilaterally.   Assessment/Plan:  1. Gait disorder  2. Degenerative arthritis  3. Rheumatoid arthritis  4. Morbid obesity  5. Diabetic peripheral neuropathy  6. History of  Guillain-Barre syndrome  7. Lumbosacral spondylosis  The patient has a multitude of issues that may be affecting her ability to ambulate. On clinical examination, the patient has a minimal peripheral neuropathy, and she does not have any true weakness of the extremities. Most of her complaints with walking are likely related to arthritic changes in the low back and legs. The patient will be set up for nerve conduction studies of both legs, and one arm. EMG  evaluation will be done on both legs. The patient will have blood work done today. The patient will followup for the EMG study.   Marlan Palau MD 11/24/2012 9:05 AM  [facility] [address] [place], [place] [address]  Phone [phone] Fax [phone]

## 2012-11-27 ENCOUNTER — Ambulatory Visit (INDEPENDENT_AMBULATORY_CARE_PROVIDER_SITE_OTHER): Payer: Medicare Other | Admitting: Neurology

## 2012-11-27 ENCOUNTER — Encounter (INDEPENDENT_AMBULATORY_CARE_PROVIDER_SITE_OTHER): Payer: Medicare Other | Admitting: Radiology

## 2012-11-27 DIAGNOSIS — G544 Lumbosacral root disorders, not elsewhere classified: Secondary | ICD-10-CM | POA: Diagnosis not present

## 2012-11-27 DIAGNOSIS — R269 Unspecified abnormalities of gait and mobility: Secondary | ICD-10-CM

## 2012-11-27 DIAGNOSIS — E1142 Type 2 diabetes mellitus with diabetic polyneuropathy: Secondary | ICD-10-CM

## 2012-11-27 DIAGNOSIS — G63 Polyneuropathy in diseases classified elsewhere: Secondary | ICD-10-CM | POA: Diagnosis not present

## 2012-11-27 DIAGNOSIS — R531 Weakness: Secondary | ICD-10-CM

## 2012-11-27 DIAGNOSIS — Z0289 Encounter for other administrative examinations: Secondary | ICD-10-CM

## 2012-11-27 LAB — PROTEIN ELECTROPHORESIS, SERUM
A/G Ratio: 1.2 (ref 0.7–2.0)
Alpha 2: 0.8 g/dL (ref 0.4–1.2)
Globulin, Total: 2.9 g/dL (ref 2.0–4.5)

## 2012-11-27 MED ORDER — GABAPENTIN 600 MG PO TABS: 600.0000 mg | ORAL_TABLET | Freq: Two times a day (BID) | ORAL | Status: DC

## 2012-11-27 NOTE — Procedures (Signed)
HISTORY:  Katherine Delgado is a 72 year old patient with a history of obesity, degenerative arthritis, and rheumatoid arthritis as well as diabetes. The patient has a prior history of Guillain-Barre syndrome, and she has an underlying peripheral neuropathy. The patient has had problems with discomfort in the feet, and low back pain. The patient is being evaluated for her neuropathy pain.  NERVE CONDUCTION STUDIES:  Nerve conduction studies were performed on both lower extremities and on the left upper extremity. The distal motor latencies and motor amplitudes for the left median and ulnar nerves were within normal limits. The F wave latencies and nerve conduction velocities for these nerves were normal. The sensory latencies for the left median and ulnar nerves were normal.  These studies of the peroneal nerves were unobtainable to the EDB muscles bilaterally. The studies were obtainable to the tibialis anterior muscle, with normal motor amplitudes. The nerve conduction velocity to the tibialis anterior muscles were normal bilaterally. The distal motor latencies for the posterior tibial nerves were normal bilaterally, with low motor amplitudes for these nerves bilaterally. Nerve conduction velocities for the posterior tibial nerves were normal bilaterally. The F wave latencies for the peroneal nerves were unobtainable bilaterally, prolonged for the posterior tibial nerves bilaterally. The peroneal sensory latencies were absent bilaterally.   EMG STUDIES:  EMG study was performed on the right lower extremity:  The tibialis anterior muscle reveals 2 to 10K motor units with decreased recruitment. No fibrillations or positive waves were seen. The peroneus tertius muscle reveals 2 to 8K motor units with decreased recruitment. No fibrillations or positive waves were seen. The medial gastrocnemius muscle reveals 2 to 5K motor units with decreased recruitment. No fibrillations or positive waves were seen. The  vastus lateralis muscle reveals 2 to 4K motor units with full recruitment. No fibrillations or positive waves were seen. The iliopsoas muscle reveals 2 to 4K motor units with full recruitment. No fibrillations or positive waves were seen. The biceps femoris muscle (long head) reveals 2 to 4K motor units with full recruitment. No fibrillations or positive waves were seen. The lumbosacral paraspinal muscles were tested at 3 levels, and revealed 2+ fibrillations at the upper level, 1+ fibrillations and positive waves at the middle and lower levels. There was good relaxation.  EMG study was performed on the left lower extremity:  The tibialis anterior muscle reveals 2 to 6K motor units with decreased recruitment. No fibrillations or positive waves were seen. The peroneus tertius muscle reveals 2 to 5K motor units with decreased recruitment. No fibrillations or positive waves were seen. The medial gastrocnemius muscle reveals 2 to 5K motor units with decreased recruitment. No fibrillations or positive waves were seen. The vastus lateralis muscle reveals 2 to 4K motor units with full recruitment. No fibrillations or positive waves were seen. The iliopsoas muscle reveals 2 to 4K motor units with full recruitment. No fibrillations or positive waves were seen. The biceps femoris muscle (long head) reveals 2 to 4K motor units with full recruitment. No fibrillations or positive waves were seen. The lumbosacral paraspinal muscles were tested at 3 levels, and revealed 1+ fibrillations and positive waves at all levels tested.. There was good relaxation.    IMPRESSION:  Nerve conduction studies done on both lower extremities and on the left upper extremity shows evidence of a primarily axonal peripheral neuropathy of moderate severity. EMG evaluations of both legs revealed evidence of distal chronic denervation consistent with the diagnosis of peripheral neuropathy. There is however, evidence of mild acute  denervation in the lumbosacral paraspinal muscles bilaterally, and the study would would be consistent with an overlying lumbosacral radiculopathy of indeterminate level, possibly at the L5 or S1 levels. Clinical correlation is required.  Marlan Palau MD 11/27/2012 5:06 PM  [facility] [address] [place], [place] [address]  Phone [phone] Fax [phone]

## 2012-11-28 NOTE — Progress Notes (Signed)
Quick Note:  I called and spoke with the patient concerning her lab results being normal. ______ 

## 2012-12-04 ENCOUNTER — Other Ambulatory Visit: Payer: Medicare Other

## 2012-12-04 DIAGNOSIS — E1159 Type 2 diabetes mellitus with other circulatory complications: Secondary | ICD-10-CM

## 2012-12-04 DIAGNOSIS — I10 Essential (primary) hypertension: Secondary | ICD-10-CM | POA: Diagnosis not present

## 2012-12-04 DIAGNOSIS — E039 Hypothyroidism, unspecified: Secondary | ICD-10-CM

## 2012-12-04 DIAGNOSIS — R609 Edema, unspecified: Secondary | ICD-10-CM | POA: Diagnosis not present

## 2012-12-05 LAB — LIPID PANEL
Chol/HDL Ratio: 4.7 ratio units — ABNORMAL HIGH (ref 0.0–4.4)
Cholesterol, Total: 154 mg/dL (ref 100–199)
HDL: 33 mg/dL — ABNORMAL LOW (ref >39)
LDL Calculated: 83 mg/dL (ref 0–99)
Triglycerides: 191 mg/dL — ABNORMAL HIGH (ref 0–149)
VLDL Cholesterol Cal: 38 mg/dL (ref 5–40)

## 2012-12-05 LAB — HEMOGLOBIN A1C
Est. average glucose Bld gHb Est-mCnc: 192 mg/dL
Hgb A1c MFr Bld: 8.3 % — ABNORMAL HIGH (ref 4.8–5.6)

## 2012-12-05 LAB — BASIC METABOLIC PANEL
BUN/Creatinine Ratio: 17 (ref 11–26)
BUN: 17 mg/dL (ref 8–27)
CO2: 27 mmol/L (ref 19–28)
Calcium: 10 mg/dL (ref 8.6–10.2)
Chloride: 99 mmol/L (ref 97–108)
Creatinine, Ser: 0.98 mg/dL (ref 0.57–1.00)
GFR calc Af Amer: 67 mL/min/{1.73_m2} (ref >59)
GFR calc non Af Amer: 58 mL/min/{1.73_m2} — ABNORMAL LOW (ref >59)
Glucose: 168 mg/dL — ABNORMAL HIGH (ref 65–99)
Potassium: 3.9 mmol/L (ref 3.5–5.2)
Sodium: 143 mmol/L (ref 134–144)

## 2012-12-05 LAB — TSH: TSH: 2.48 u[IU]/mL (ref 0.450–4.500)

## 2012-12-06 ENCOUNTER — Encounter: Payer: Self-pay | Admitting: *Deleted

## 2012-12-06 ENCOUNTER — Encounter: Payer: Self-pay | Admitting: Internal Medicine

## 2012-12-06 ENCOUNTER — Other Ambulatory Visit: Payer: Self-pay | Admitting: Geriatric Medicine

## 2012-12-06 ENCOUNTER — Other Ambulatory Visit: Payer: Self-pay | Admitting: *Deleted

## 2012-12-06 ENCOUNTER — Ambulatory Visit (INDEPENDENT_AMBULATORY_CARE_PROVIDER_SITE_OTHER): Payer: Medicare Other | Admitting: Internal Medicine

## 2012-12-06 VITALS — BP 146/80 | HR 112 | Temp 98.5°F | Resp 20 | Ht 66.0 in | Wt 285.6 lb

## 2012-12-06 DIAGNOSIS — G4700 Insomnia, unspecified: Secondary | ICD-10-CM | POA: Diagnosis not present

## 2012-12-06 DIAGNOSIS — F329 Major depressive disorder, single episode, unspecified: Secondary | ICD-10-CM | POA: Diagnosis not present

## 2012-12-06 DIAGNOSIS — E119 Type 2 diabetes mellitus without complications: Secondary | ICD-10-CM

## 2012-12-06 DIAGNOSIS — F3289 Other specified depressive episodes: Secondary | ICD-10-CM | POA: Diagnosis not present

## 2012-12-06 DIAGNOSIS — E785 Hyperlipidemia, unspecified: Secondary | ICD-10-CM

## 2012-12-06 DIAGNOSIS — R197 Diarrhea, unspecified: Secondary | ICD-10-CM | POA: Diagnosis not present

## 2012-12-06 DIAGNOSIS — M069 Rheumatoid arthritis, unspecified: Secondary | ICD-10-CM

## 2012-12-06 DIAGNOSIS — N3941 Urge incontinence: Secondary | ICD-10-CM | POA: Insufficient documentation

## 2012-12-06 DIAGNOSIS — G63 Polyneuropathy in diseases classified elsewhere: Secondary | ICD-10-CM

## 2012-12-06 DIAGNOSIS — R110 Nausea: Secondary | ICD-10-CM | POA: Diagnosis not present

## 2012-12-06 MED ORDER — OXYCODONE HCL 5 MG PO TABS: ORAL_TABLET | ORAL | Status: DC

## 2012-12-06 MED ORDER — INSULIN ASPART 100 UNIT/ML ~~LOC~~ SOLN: 16.0000 [IU] | Freq: Three times a day (TID) | SUBCUTANEOUS | Status: DC

## 2012-12-06 MED ORDER — MELATONIN 3 MG PO TABS: 1.0000 | ORAL_TABLET | Freq: Every evening | ORAL | Status: DC | PRN

## 2012-12-06 MED ORDER — HYDROCODONE-ACETAMINOPHEN 10-325 MG PO TABS: ORAL_TABLET | ORAL | Status: DC

## 2012-12-06 NOTE — Progress Notes (Signed)
Patient ID: Katherine Delgado, female   DOB: 11-Jun-1941, 72 y.o.   MRN: [id number] Code Status: need to review this with her    Allergies  Allergen Reactions  . Penicillins     REACTION: Reaction not known  . Sulfamethoxazole W-Trimethoprim     REACTION: Reaction not known    Chief Complaint  Patient presents with  . Medical Managment of Chronic Issues  . sleep disturbance    HPI: Patient is a 72 y.o. white female seen in the office today for med mgt of chronic diseases.  Her biggest concern today is that she is unable to get a good night's sleep.  This is sometimes due to pain.  She's been using hydrocodone at bedtime with some benefit.  Review of Systems:  Review of Systems  Constitutional: Positive for malaise/fatigue. Negative for fever, chills and weight loss.  HENT:       Dry mouth  Eyes:       Dry eyes  Respiratory: Positive for shortness of breath. Negative for cough and wheezing.   Cardiovascular: Negative for chest pain.  Gastrointestinal: Negative for heartburn, abdominal pain, constipation, blood in stool and melena.  Genitourinary: Negative for dysuria.  Musculoskeletal: Positive for myalgias, back pain and joint pain. Negative for falls.  Skin: Negative for rash.  Neurological: Positive for tingling, sensory change and weakness. Negative for dizziness and headaches.  Psychiatric/Behavioral: Negative for depression and memory loss. The patient has insomnia. The patient is not nervous/anxious.     Past Medical History  Diagnosis Date  . COPD (chronic obstructive pulmonary disease)   . Hypertension   . RA (rheumatoid arthritis)   . Thyroid disease   . Miscarriage 1962  . History of benign thymus tumor   . Back injury   . GERD (gastroesophageal reflux disease)   . Arthritis   . Fibromyalgia   . Diabetes mellitus without complication   . Thyroid disorder   . Acute infective polyneuritis 2002  . Guillain-Barre syndrome   . Polyneuropathy in diabetes(357.2)   .  Morbid obesity   . Spondylosis, lumbosacral   . Degenerative arthritis   . Dyslipidemia   . Spinal stenosis, lumbar region, without neurogenic claudication   . Candidiasis of skin and nails   . Acute maxillary sinusitis   . Insomnia, unspecified   . Candidiasis of vulva and vagina   . Edema   . Acute sinusitis, unspecified   . Obstructive chronic bronchitis with acute bronchitis   . Urinary tract infection, site not specified   . Unspecified hypothyroidism   . Type II or unspecified type diabetes mellitus with peripheral circulatory disorders, uncontrolled(250.72)   . Mixed hyperlipidemia   . Other specified disease of white blood cells   . Depressive disorder, not elsewhere classified   . Chronic pain syndrome   . Tear film insufficiency, unspecified   . Unspecified essential hypertension   . Allergic rhinitis due to pollen   . Unspecified chronic bronchitis   . Reflux esophagitis   . Diaphragmatic hernia without mention of obstruction or gangrene   . Unspecified pruritic disorder   . Rheumatoid arthritis   . Pain in joint, multiple sites   . Stiffness of joints, not elsewhere classified, multiple sites   . Lumbago   . Other malaise and fatigue   . Spontaneous ecchymoses   . Abnormal weight gain   . Shortness of breath   . Cough   . Unspecified urinary incontinence    Past Surgical History  Procedure Laterality Date  . Abdominal hysterectomy  1974  . Tonsillectomy    . Cholecystectomy  1984  . Back surgery  1982  . Ovarian cyst surgery  1968  . Thymus tumor    . Knee surgery Bilateral 08/27/2010 (L) and 01/11/2011 (R)  . Miscarrage  1962  . Bronchoscopy  2001  . Abdominal tumor  2002  . Thymus tumor  10/2000   Social History:   reports that she quit smoking about 33 years ago. Her smoking use included Cigarettes. She smoked 0.00 packs per day. She does not have any smokeless tobacco history on file. She reports that she does not drink alcohol or use illicit  drugs.  Family History  Problem Relation Age of Onset  . Alzheimer's disease Mother   . Heart disease Mother   . Heart disease Father   . Liver disease Father   . Cancer Sister     Medications: Patient's Medications  New Prescriptions   No medications on file  Previous Medications   ASPIRIN EC 81 MG TABLET    Take 81 mg by mouth daily as needed.    BECLOMETHASONE (QVAR) 80 MCG/ACT INHALER    Inhale 1 puff into the lungs 2 (two) times daily as needed. For wheezing   CHOLECALCIFEROL (VITAMIN D-3) 5000 UNITS TABS    Take by mouth daily. Inject 70 units at bedtime for diabetes Dx. 250.72   FOLIC ACID (FOLVITE) 400 MCG TABLET    Take 400 mcg by mouth 2 (two) times daily.   FUROSEMIDE (LASIX) 20 MG TABLET    Take 20 mg by mouth 2 (two) times daily. Use as needed for 3 pounds gain in one day or 5 pounds in one week   GABAPENTIN (NEURONTIN) 300 MG CAPSULE    Take 300 mg by mouth 3 (three) times daily. Take one-two tablets every 8 hours as needed to help control pain   INSULIN ASPART (NOVOLOG FLEXPEN) 100 UNIT/ML INJECTION    Inject into the skin 3 (three) times daily before meals. Take 10 units three times a day with meals   INSULIN GLARGINE (LANTUS) 100 UNIT/ML INJECTION    Inject into the skin at bedtime. Inject 70 units at bedtime for diabe   IPRATROPIUM-ALBUTEROL (DUONEB) 0.5-2.5 (3) MG/3ML SOLN    Take 3 mLs by nebulization. Use every 6 hours as needed for wheezing and SOB   LEVOTHYROXINE (SYNTHROID, LEVOTHROID) 200 MCG TABLET    Take 200 mcg by mouth daily.   LIDOCAINE (LIDODERM) 5 %    Place 1 patch onto the skin as needed.   METHOTREXATE, PF, 25 MG/0.4ML SOAJ    Inject 1 Syringe into the skin once a week.   NYSTATIN (NYAMYC) 100000 UNIT/GM POWD    Apply 1 g topically daily as needed (for itching).    NYSTATIN-TRIAMCINOLONE (MYCOLOG II) CREAM    Apply topically 4 (four) times daily. Apply under abdomen folds twice daily until rash healed   OMEGA-3 ACID ETHYL ESTERS (LOVAZA) 1 G CAPSULE     Take 1 g by mouth daily.    POLYVINYL ALCOHOL (LIQUIFILM TEARS) 1.4 % OPHTHALMIC SOLUTION    Place 1 drop into both eyes as needed (for dry eyes).    POTASSIUM CHLORIDE (K-DUR) 10 MEQ TABLET    Take 20 mEq by mouth daily as needed (for fluid.  takes with furosemide).    PROMETHAZINE (PHENERGAN) 12.5 MG TABLET    Take 12.5 mg by mouth every 6 (six) hours as needed.  TIOTROPIUM (SPIRIVA) 18 MCG INHALATION CAPSULE    Place 18 mcg into inhaler and inhale daily.   TRIAMTERENE-HYDROCHLOROTHIAZIDE (MAXZIDE-25) 37.5-25 MG PER TABLET    Take 1 tablet by mouth daily. Take one tablet once a day for blood pressure  Modified Medications   Modified Medication Previous Medication   HYDROCODONE-ACETAMINOPHEN (NORCO) 10-325 MG PER TABLET HYDROcodone-acetaminophen (NORCO) 10-325 MG per tablet      Take one tablet every three hours as needed for pain    Take one tablet every three hours as needed for pain   OXYCODONE (OXY IR/ROXICODONE) 5 MG IMMEDIATE RELEASE TABLET oxyCODONE (OXY IR/ROXICODONE) 5 MG immediate release tablet      Take one tablet every 6 hours as needed for severe pain, Take 2 tablets at bedtime.    Take one tablet every 6 hours as needed for severe pain, Take 2 tablets at bedtime.  Discontinued Medications   No medications on file     Physical Exam: Filed Vitals:   12/06/12 1346  BP: 146/80  Pulse: 112  Temp: 98.5 F (36.9 C)  Resp: 20  Height: 5\' 6"  (1.676 m)  Weight: 285 lb 9.6 oz (129.547 kg)  SpO2: 98%  Physical Exam  Constitutional: She is oriented to person, place, and time. She appears well-developed and well-nourished. No distress.  HENT:  Head: Normocephalic and atraumatic.  Cardiovascular: Normal rate, regular rhythm, normal heart sounds and intact distal pulses.   Pulmonary/Chest: Effort normal and breath sounds normal. No respiratory distress. She has no wheezes.  Abdominal: Soft. Bowel sounds are normal. She exhibits no distension. There is no tenderness.   Musculoskeletal: She exhibits tenderness. She exhibits no edema.  Neurological: She is alert and oriented to person, place, and time.  Skin: Skin is warm and dry.       Labs reviewed: Basic Metabolic Panel:  Recent Labs  11/91/47 0843  NA 143  K 3.9  CL 99  CO2 27  GLUCOSE 168*  BUN 17  CREATININE 0.98  CALCIUM 10.0  TSH 2.480   Liver Function Tests:  Recent Labs  11/23/12 1316  PROT 6.5   CBC:  Recent Labs  02/02/12 1148  WBC 15.6*  NEUTROABS 11.7*  HGB 12.6  HCT 39.4  MCV 86.4  PLT 324   Lipid Panel:  Recent Labs  12/04/12 0843  HDL 33*  LDLCALC 83  TRIG 829*  CHOLHDL 4.7*   Assessment/Plan Insomnia Is not sleeping at all.  Seems related to pain waking her up.  Does not get good REM sleep.  Can't sleep on left hip.  Neuropathy in feet disturbs her.  Mouth gets dry breathing through it with her Sjogren's syndrome.    DEPRESSION Denies actual depression at this point.  Is fatigued and frustrated about being chronically ill.    Nausea alone Feels nauseated and never vomits.  Thinks this is due to her medications.  Using phenergan for this.  Diarrhea New problem--loose stool.  Almost every time she goes to the bathroom.  Usually almost immediately after eats, has to go.  Will be pure watery stool 2-3x per week.  Using imodium for it.    Polyneuropathy in other diseases classified elsewhere Has been to Dr. Anne Hahn for her neuropathy.  Some axonal (? Guillain barre effect) and some peripheral from DMII.  Cont gabapentin 300mg  po tid.  DIABETES MELLITUS Complicated by neuropathy at this time.  hba1c not at goal--over 8.  Increase novolog to 16 units ac meals, continue lantus 70 units  at bedtime.  Does not check glucose 4x daily as advised.  Does check 2x at home daily:  Have been high--ends up eating at night when doesn't sleep so first thing checks are not fasting.  Only eats two regular meals--midmorning and dinner.    Labs/tests ordered: hba1c,  cbc, bmp before 3 mo f/u

## 2012-12-06 NOTE — Assessment & Plan Note (Addendum)
Has been to Dr. Anne Hahn for her neuropathy.  Some axonal (? Guillain barre effect) and some peripheral from DMII.  Cont gabapentin 300mg  po tid.

## 2012-12-06 NOTE — Patient Instructions (Addendum)
Increase novolog with meals (eats only twice) to 16 units before meals.   Continue lantus 70 units at bedtime.   Ask Dr Corliss Skains about methotrexate and plaquenil affecting your bowels.

## 2012-12-06 NOTE — Assessment & Plan Note (Signed)
Complicated by neuropathy at this time.  hba1c not at goal--over 8.  Increase novolog to 16 units ac meals, continue lantus 70 units at bedtime.  Does not check glucose 4x daily as advised.  Does check 2x at home daily:  Have been high--ends up eating at night when doesn't sleep so first thing checks are not fasting.  Only eats two regular meals--midmorning and dinner.

## 2012-12-06 NOTE — Assessment & Plan Note (Signed)
Is not sleeping at all.  Seems related to pain waking her up.  Does not get good REM sleep.  Can't sleep on left hip.  Neuropathy in feet disturbs her.  Mouth gets dry breathing through it with her Sjogren's syndrome.

## 2012-12-06 NOTE — Assessment & Plan Note (Signed)
Denies actual depression at this point.  Is fatigued and frustrated about being chronically ill.

## 2012-12-06 NOTE — Assessment & Plan Note (Signed)
Sees Dr. Corliss Skains.  Also saw someone in [place] and they recommended an alternative immunosuppressive; however, there is a risk of Guillain-Barre which she has had in the past.  Is back on methotrexate weekly injections.  Has had loose stools in context of this and plaquenil.

## 2012-12-06 NOTE — Assessment & Plan Note (Signed)
LDL is at goal.  TG elevated due to starches in diet.  Knows she needs to work on this.  Cannot do much exercise.

## 2012-12-06 NOTE — Assessment & Plan Note (Addendum)
Feels nauseated and never vomits.  Thinks this is due to her medications.  Using phenergan for this.

## 2012-12-06 NOTE — Assessment & Plan Note (Signed)
New problem--loose stool.  Almost every time she goes to the bathroom.  Usually almost immediately after eats, has to go.  Will be pure watery stool 2-3x per week.  Using imodium for it.

## 2012-12-06 NOTE — Assessment & Plan Note (Signed)
Lately having leakage when stands up.  Is improved a little as she is moving around more.  Has bedside commode in den.  Wonders if her bladder is dropping.  Says side effects of meds sound terrible and she will wear a pad.

## 2012-12-25 ENCOUNTER — Encounter: Payer: Self-pay | Admitting: Neurology

## 2013-01-01 DIAGNOSIS — M1710 Unilateral primary osteoarthritis, unspecified knee: Secondary | ICD-10-CM | POA: Diagnosis not present

## 2013-01-01 DIAGNOSIS — M069 Rheumatoid arthritis, unspecified: Secondary | ICD-10-CM | POA: Diagnosis not present

## 2013-01-01 DIAGNOSIS — Z09 Encounter for follow-up examination after completed treatment for conditions other than malignant neoplasm: Secondary | ICD-10-CM | POA: Diagnosis not present

## 2013-01-01 DIAGNOSIS — M25569 Pain in unspecified knee: Secondary | ICD-10-CM | POA: Diagnosis not present

## 2013-01-01 DIAGNOSIS — Z79899 Other long term (current) drug therapy: Secondary | ICD-10-CM | POA: Diagnosis not present

## 2013-01-03 ENCOUNTER — Other Ambulatory Visit: Payer: Self-pay | Admitting: Geriatric Medicine

## 2013-01-03 MED ORDER — OXYCODONE HCL 5 MG PO TABS: ORAL_TABLET | ORAL | Status: DC

## 2013-01-09 ENCOUNTER — Encounter: Payer: Self-pay | Admitting: *Deleted

## 2013-01-09 NOTE — Progress Notes (Signed)
Review of labs from Dr. Ardelle Balls office by Dr. Truett Perna: Elevated WBC's stable, MCV is lower. Needs to recheck CBC in 3-4 months. Noted this is being done per Dr. Ernest Mallick office in July. Will request copy of results be forwarded to Dr. Truett Perna.

## 2013-01-16 ENCOUNTER — Other Ambulatory Visit: Payer: Self-pay | Admitting: Geriatric Medicine

## 2013-01-16 MED ORDER — GLUCOSE BLOOD VI STRP: ORAL_STRIP | Status: DC

## 2013-01-17 ENCOUNTER — Other Ambulatory Visit: Payer: Self-pay | Admitting: Internal Medicine

## 2013-01-23 ENCOUNTER — Telehealth: Payer: Self-pay | Admitting: Neurology

## 2013-01-23 DIAGNOSIS — M47817 Spondylosis without myelopathy or radiculopathy, lumbosacral region: Secondary | ICD-10-CM

## 2013-01-23 NOTE — Telephone Encounter (Signed)
I called patient. The patient is having a lot of pain with trying to walk, and she is very limited at this point. The patient has a lot of arthritic changes in the back, but also involving the knees. I'll try an epidural steroid injection to see this helps. The patient may require physical therapy in the future.

## 2013-01-23 NOTE — Telephone Encounter (Signed)
Patient called stating she was able to walk with cane, but now she's unable to walk at all due pain and weakness. Patient would like to speak with physician.

## 2013-02-01 ENCOUNTER — Other Ambulatory Visit: Payer: Self-pay | Admitting: *Deleted

## 2013-02-01 MED ORDER — OXYCODONE HCL 5 MG PO TABS: ORAL_TABLET | ORAL | Status: DC

## 2013-02-04 ENCOUNTER — Other Ambulatory Visit: Payer: Self-pay | Admitting: Neurology

## 2013-02-04 DIAGNOSIS — M47817 Spondylosis without myelopathy or radiculopathy, lumbosacral region: Secondary | ICD-10-CM

## 2013-02-06 ENCOUNTER — Ambulatory Visit
Admission: RE | Admit: 2013-02-06 | Discharge: 2013-02-06 | Disposition: A | Discharge status: Home / Self Care | Payer: Medicare Other | Source: Ambulatory Visit | Attending: Neurology | Admitting: Neurology

## 2013-02-06 DIAGNOSIS — M47817 Spondylosis without myelopathy or radiculopathy, lumbosacral region: Secondary | ICD-10-CM

## 2013-02-06 DIAGNOSIS — M545 Low back pain, unspecified: Secondary | ICD-10-CM | POA: Diagnosis not present

## 2013-02-06 MED ORDER — IOHEXOL 180 MG/ML  SOLN
1.0000 mL | Freq: Once | INTRAMUSCULAR | Status: AC | PRN
  Administered 2013-02-06: 1 mL via EPIDURAL

## 2013-02-06 MED ORDER — METHYLPREDNISOLONE ACETATE 40 MG/ML INJ SUSP (RADIOLOG
120.0000 mg | Freq: Once | INTRAMUSCULAR | Status: AC
  Administered 2013-02-06: 120 mg via EPIDURAL

## 2013-02-20 ENCOUNTER — Other Ambulatory Visit: Payer: Self-pay | Admitting: Internal Medicine

## 2013-02-27 ENCOUNTER — Telehealth: Payer: Self-pay | Admitting: Neurology

## 2013-02-27 DIAGNOSIS — M545 Low back pain, unspecified: Secondary | ICD-10-CM

## 2013-02-28 NOTE — Telephone Encounter (Signed)
The epidural steroid injections were last done in June. This can be repeated if needed. I'll get this set up. I did not call the patient.

## 2013-02-28 NOTE — Telephone Encounter (Signed)
Injection was done at [facility].  Pain is beginning to return and pt would like to know if she can get another injection and how often she can get them.  Her next appointment with Dr Anne Hahn is 04/19/13 at 11 AM.

## 2013-03-01 ENCOUNTER — Other Ambulatory Visit: Payer: Self-pay | Admitting: Geriatric Medicine

## 2013-03-01 MED ORDER — OXYCODONE HCL 5 MG PO TABS: ORAL_TABLET | ORAL | Status: DC

## 2013-03-04 ENCOUNTER — Other Ambulatory Visit: Payer: Medicare Other

## 2013-03-04 ENCOUNTER — Inpatient Hospital Stay: Admission: RE | Admit: 2013-03-04 | Payer: Medicare Other | Source: Ambulatory Visit

## 2013-03-04 ENCOUNTER — Other Ambulatory Visit: Payer: Self-pay | Admitting: Neurology

## 2013-03-04 ENCOUNTER — Ambulatory Visit
Admission: RE | Admit: 2013-03-04 | Discharge: 2013-03-04 | Disposition: A | Discharge status: Home / Self Care | Payer: Medicare Other | Source: Ambulatory Visit | Attending: Neurology | Admitting: Neurology

## 2013-03-04 DIAGNOSIS — M545 Low back pain, unspecified: Secondary | ICD-10-CM | POA: Diagnosis not present

## 2013-03-04 DIAGNOSIS — M48061 Spinal stenosis, lumbar region without neurogenic claudication: Secondary | ICD-10-CM | POA: Diagnosis not present

## 2013-03-04 DIAGNOSIS — M5126 Other intervertebral disc displacement, lumbar region: Secondary | ICD-10-CM | POA: Diagnosis not present

## 2013-03-04 DIAGNOSIS — M549 Dorsalgia, unspecified: Secondary | ICD-10-CM | POA: Diagnosis not present

## 2013-03-04 MED ORDER — IOHEXOL 180 MG/ML  SOLN
1.0000 mL | Freq: Once | INTRAMUSCULAR | Status: AC | PRN
  Administered 2013-03-04: 1 mL via EPIDURAL

## 2013-03-04 MED ORDER — METHYLPREDNISOLONE ACETATE 40 MG/ML INJ SUSP (RADIOLOG
120.0000 mg | Freq: Once | INTRAMUSCULAR | Status: AC
  Administered 2013-03-04: 120 mg via EPIDURAL

## 2013-03-11 ENCOUNTER — Other Ambulatory Visit: Payer: Medicare Other

## 2013-03-11 DIAGNOSIS — M1710 Unilateral primary osteoarthritis, unspecified knee: Secondary | ICD-10-CM | POA: Diagnosis not present

## 2013-03-11 DIAGNOSIS — M069 Rheumatoid arthritis, unspecified: Secondary | ICD-10-CM | POA: Diagnosis not present

## 2013-03-11 DIAGNOSIS — M51379 Other intervertebral disc degeneration, lumbosacral region without mention of lumbar back pain or lower extremity pain: Secondary | ICD-10-CM | POA: Diagnosis not present

## 2013-03-11 DIAGNOSIS — M5137 Other intervertebral disc degeneration, lumbosacral region: Secondary | ICD-10-CM | POA: Diagnosis not present

## 2013-03-11 DIAGNOSIS — Z09 Encounter for follow-up examination after completed treatment for conditions other than malignant neoplasm: Secondary | ICD-10-CM | POA: Diagnosis not present

## 2013-03-11 DIAGNOSIS — E782 Mixed hyperlipidemia: Secondary | ICD-10-CM | POA: Diagnosis not present

## 2013-03-11 DIAGNOSIS — D7289 Other specified disorders of white blood cells: Secondary | ICD-10-CM | POA: Diagnosis not present

## 2013-03-11 DIAGNOSIS — G894 Chronic pain syndrome: Secondary | ICD-10-CM | POA: Diagnosis not present

## 2013-03-12 DIAGNOSIS — Z111 Encounter for screening for respiratory tuberculosis: Secondary | ICD-10-CM | POA: Diagnosis not present

## 2013-03-12 DIAGNOSIS — Z79899 Other long term (current) drug therapy: Secondary | ICD-10-CM | POA: Diagnosis not present

## 2013-03-12 DIAGNOSIS — Z13220 Encounter for screening for lipoid disorders: Secondary | ICD-10-CM | POA: Diagnosis not present

## 2013-03-13 ENCOUNTER — Encounter: Payer: Self-pay | Admitting: *Deleted

## 2013-03-13 NOTE — Progress Notes (Signed)
Received labs from Dr. Ardelle Balls office: WBC stable at 16.3 and mcv is better at 79.4. Copy to MD desk for review.

## 2013-03-14 ENCOUNTER — Ambulatory Visit (INDEPENDENT_AMBULATORY_CARE_PROVIDER_SITE_OTHER): Payer: Medicare Other | Admitting: Internal Medicine

## 2013-03-14 VITALS — BP 150/72 | HR 90 | Temp 98.6°F | Resp 18 | Ht 66.0 in | Wt 278.4 lb

## 2013-03-14 DIAGNOSIS — G63 Polyneuropathy in diseases classified elsewhere: Secondary | ICD-10-CM

## 2013-03-14 DIAGNOSIS — N3941 Urge incontinence: Secondary | ICD-10-CM

## 2013-03-14 DIAGNOSIS — M545 Low back pain, unspecified: Secondary | ICD-10-CM

## 2013-03-14 DIAGNOSIS — M069 Rheumatoid arthritis, unspecified: Secondary | ICD-10-CM | POA: Diagnosis not present

## 2013-03-14 DIAGNOSIS — E039 Hypothyroidism, unspecified: Secondary | ICD-10-CM

## 2013-03-14 DIAGNOSIS — E1159 Type 2 diabetes mellitus with other circulatory complications: Secondary | ICD-10-CM

## 2013-03-14 DIAGNOSIS — G4700 Insomnia, unspecified: Secondary | ICD-10-CM

## 2013-03-14 DIAGNOSIS — G8929 Other chronic pain: Secondary | ICD-10-CM | POA: Insufficient documentation

## 2013-03-14 DIAGNOSIS — L659 Nonscarring hair loss, unspecified: Secondary | ICD-10-CM

## 2013-03-14 MED ORDER — TIOTROPIUM BROMIDE MONOHYDRATE 18 MCG IN CAPS: 18.0000 ug | ORAL_CAPSULE | Freq: Every day | RESPIRATORY_TRACT | Status: DC

## 2013-03-14 MED ORDER — SYNTHROID 200 MCG PO TABS: 200.0000 ug | ORAL_TABLET | Freq: Every day | ORAL | Status: DC

## 2013-03-14 MED ORDER — GABAPENTIN 600 MG PO TABS: 600.0000 mg | ORAL_TABLET | Freq: Three times a day (TID) | ORAL | Status: DC

## 2013-03-14 MED ORDER — TRIAMTERENE-HCTZ 37.5-25 MG PO TABS: 1.0000 | ORAL_TABLET | Freq: Every day | ORAL | Status: DC

## 2013-03-14 MED ORDER — ZOLPIDEM TARTRATE 5 MG PO TABS: 5.0000 mg | ORAL_TABLET | Freq: Every evening | ORAL | Status: DC | PRN

## 2013-03-14 MED ORDER — POTASSIUM CHLORIDE ER 10 MEQ PO CPCR: 10.0000 meq | ORAL_CAPSULE | Freq: Two times a day (BID) | ORAL | Status: DC

## 2013-03-14 MED ORDER — HYDROCODONE-ACETAMINOPHEN 10-325 MG PO TABS
ORAL_TABLET | ORAL | Status: DC
Start: 2013-03-14 — End: 2013-05-28

## 2013-03-14 MED ORDER — OXYBUTYNIN CHLORIDE ER 5 MG PO TB24: 5.0000 mg | ORAL_TABLET | Freq: Every day | ORAL | Status: DC

## 2013-03-14 MED ORDER — OMEGA-3-ACID ETHYL ESTERS 1 G PO CAPS: 1.0000 g | ORAL_CAPSULE | Freq: Every day | ORAL | Status: DC

## 2013-03-14 NOTE — Progress Notes (Signed)
Patient ID: Katherine Delgado, female   DOB: [birth year], 72 y.o.   MRN: [id number] Location:  [facility] / [facility]  Code Status: must address next visit  Allergies  Allergen Reactions  . Penicillins     REACTION: Reaction not known  . Sulfamethoxazole W-Trimethoprim     REACTION: Reaction not known    Chief Complaint  Patient presents with  . Follow-up    labs    HPI: Patient is a 72 y.o. white female seen in the office today for follow up of her labs.    They are thinking of trying xeljanz on her.  WBC is sky high again.  Dr. Myrle Sheng has reviewed this and felt it was stable.  Also seeing Dr. Zadie Rhine nerve conduction studies.  Had injections in back in June which helped her greatly.  Did another one mid-July--working but not as well.  Knees and OA bothering her more.  Is able to move around more with this.  Was on cymbalta at some point.  A long time ago she was on lyrica, too.  Doing gabapentin for the neuropathy.    Sleeping still stinks.  Will doze 10-15 mins, then up all night.  Melatonin is not helping.  Knows pain awakens her.    Incontinence:  It remains a problem.  Does not want to pee in her pants the rest of her life. Urgency and stress incontinence.  Avoids drinking so she will not be incontinent.  Asks about desmopressin.    Is now losing her hair.  Asks about meds causing.  Is off of methotrexate.  Is not taking any RA specific meds anymore.    Review of Systems:  Review of Systems  Constitutional: Positive for malaise/fatigue. Negative for fever and chills.  HENT: Negative for congestion.   Eyes: Negative for blurred vision.  Respiratory: Positive for cough, sputum production and shortness of breath. Negative for wheezing.   Cardiovascular: Negative for chest pain.  Gastrointestinal: Negative for heartburn, abdominal pain, blood in stool and melena.  Genitourinary: Negative for dysuria.  Musculoskeletal: Positive for myalgias and  back pain. Negative for falls.  Skin: Negative for rash.  Neurological: Positive for sensory change and weakness. Negative for dizziness and headaches.  Endo/Heme/Allergies: Bruises/bleeds easily.  Psychiatric/Behavioral: Positive for depression. Negative for memory loss. The patient is nervous/anxious.     Past Medical History  Diagnosis Date  . COPD (chronic obstructive pulmonary disease)   . Hypertension   . RA (rheumatoid arthritis)   . Thyroid disease   . Miscarriage 1962  . History of benign thymus tumor   . Back injury   . GERD (gastroesophageal reflux disease)   . Arthritis   . Fibromyalgia   . Diabetes mellitus without complication   . Thyroid disorder   . Acute infective polyneuritis 2002  . Guillain-Barre syndrome   . Polyneuropathy in diabetes(357.2)   . Morbid obesity   . Spondylosis, lumbosacral   . Degenerative arthritis   . Dyslipidemia   . Spinal stenosis, lumbar region, without neurogenic claudication   . Candidiasis of skin and nails   . Acute maxillary sinusitis   . Insomnia, unspecified   . Candidiasis of vulva and vagina   . Edema   . Acute sinusitis, unspecified   . Obstructive chronic bronchitis with acute bronchitis   . Urinary tract infection, site not specified   . Unspecified hypothyroidism   . Type II or unspecified type diabetes mellitus with peripheral circulatory disorders, uncontrolled(250.72)   .  Mixed hyperlipidemia   . Other specified disease of white blood cells   . Depressive disorder, not elsewhere classified   . Chronic pain syndrome   . Tear film insufficiency, unspecified   . Unspecified essential hypertension   . Allergic rhinitis due to pollen   . Unspecified chronic bronchitis   . Reflux esophagitis   . Diaphragmatic hernia without mention of obstruction or gangrene   . Unspecified pruritic disorder   . Rheumatoid arthritis(714.0)   . Pain in joint, multiple sites   . Stiffness of joints, not elsewhere classified,  multiple sites   . Lumbago   . Other malaise and fatigue   . Spontaneous ecchymoses   . Abnormal weight gain   . Shortness of breath   . Cough   . Unspecified urinary incontinence     Past Surgical History  Procedure Laterality Date  . Abdominal hysterectomy  1974  . Tonsillectomy    . Cholecystectomy  1984  . Back surgery  1982  . Ovarian cyst surgery  1968  . Thymus tumor    . Knee surgery Bilateral 08/27/2010 (L) and 01/11/2011 (R)  . Miscarrage  1962  . Bronchoscopy  2001  . Abdominal tumor  2002  . Thymus tumor  10/2000    Social History:   reports that she quit smoking about 33 years ago. Her smoking use included Cigarettes. She smoked 0.00 packs per day. She does not have any smokeless tobacco history on file. She reports that she does not drink alcohol or use illicit drugs.  Family History  Problem Relation Age of Onset  . Alzheimer's disease Mother   . Heart disease Mother   . Heart disease Father   . Liver disease Father   . Cancer Sister     Medications: Patient's Medications  New Prescriptions   No medications on file  Previous Medications   ASPIRIN EC 81 MG TABLET    Take 81 mg by mouth daily as needed.    BECLOMETHASONE (QVAR) 80 MCG/ACT INHALER    Inhale 1 puff into the lungs 2 (two) times daily as needed. For wheezing   CHOLECALCIFEROL (VITAMIN D-3) 5000 UNITS TABS    Take by mouth daily. Inject 70 units at bedtime for diabetes Dx. 250.72   FOLIC ACID (FOLVITE) 400 MCG TABLET    Take 400 mcg by mouth 2 (two) times daily.   FUROSEMIDE (LASIX) 20 MG TABLET    Take 20 mg by mouth 2 (two) times daily. Use as needed for 3 pounds gain in one day or 5 pounds in one week   GABAPENTIN (NEURONTIN) 300 MG CAPSULE    Take 300 mg by mouth 3 (three) times daily. Take one-two tablets every 8 hours as needed to help control pain   GLUCOSE BLOOD (ACCU-CHEK AVIVA) TEST STRIP    Use as instructed to check glucose 3 to 4 times daily to control blood sugar. 250.00    HYDROCODONE-ACETAMINOPHEN (NORCO) 10-325 MG PER TABLET    Take one tablet every three hours as needed for pain   INSULIN ASPART (NOVOLOG FLEXPEN) 100 UNIT/ML INJECTION    Inject 16 Units into the skin 3 (three) times daily before meals.   INSULIN GLARGINE (LANTUS) 100 UNIT/ML INJECTION    Inject into the skin at bedtime. Inject 70 units at bedtime for diabe   IPRATROPIUM-ALBUTEROL (DUONEB) 0.5-2.5 (3) MG/3ML SOLN    Take 3 mLs by nebulization. Use every 6 hours as needed for wheezing and SOB   LIDOCAINE (  LIDODERM) 5 %    Place 1 patch onto the skin as needed.   MELATONIN 3 MG TABS    Take 1 tablet (3 mg total) by mouth at bedtime as needed.   NYSTATIN (NYAMYC) 100000 UNIT/GM POWD    Apply 1 g topically daily as needed (for itching).    NYSTATIN-TRIAMCINOLONE (MYCOLOG II) CREAM    Apply topically 4 (four) times daily. Apply under abdomen folds twice daily until rash healed   OMEGA-3 ACID ETHYL ESTERS (LOVAZA) 1 G CAPSULE    Take 1 g by mouth daily.    OXYCODONE (OXY IR/ROXICODONE) 5 MG IMMEDIATE RELEASE TABLET    Take one tablet every 6 hours as needed for severe pain, Take 2 tablets at bedtime.   POLYVINYL ALCOHOL (LIQUIFILM TEARS) 1.4 % OPHTHALMIC SOLUTION    Place 1 drop into both eyes as needed (for dry eyes).    POTASSIUM CHLORIDE (K-DUR) 10 MEQ TABLET    Take 20 mEq by mouth daily as needed (for fluid.  takes with furosemide).    PROMETHAZINE (PHENERGAN) 12.5 MG TABLET    Take 12.5 mg by mouth every 6 (six) hours as needed.    SYNTHROID 200 MCG TABLET    TAKE 1 TABLET BY MOUTH ONCE DAILY SEPARATE FROM OTHER MEDS FIRST THING ON AN EMPTY STOMACH   TIOTROPIUM (SPIRIVA) 18 MCG INHALATION CAPSULE    Place 18 mcg into inhaler and inhale daily.   TRIAMTERENE-HYDROCHLOROTHIAZIDE (MAXZIDE-25) 37.5-25 MG PER TABLET    Take 1 tablet by mouth daily. Take one tablet once a day for blood pressure  Modified Medications   No medications on file  Discontinued Medications   HYDROCODONE-ACETAMINOPHEN (NORCO)  10-325 MG PER TABLET       METHOTREXATE, PF, 25 MG/0.4ML SOAJ    Inject 1 Syringe into the skin once a week.   Physical Exam: Filed Vitals:   03/14/13 1331  BP: 150/72  Pulse: 90  Temp: 98.6 F (37 C)  TempSrc: Oral  Resp: 18  Height: 5\' 6"  (1.676 m)  Weight: 278 lb 6.4 oz (126.281 kg)  SpO2: 95%  Physical Exam  Constitutional: She is oriented to person, place, and time. She appears well-developed and well-nourished. No distress.  Obese white female  HENT:  Head: Normocephalic and atraumatic.  Cardiovascular: Normal rate, regular rhythm, normal heart sounds and intact distal pulses.   Pulmonary/Chest: Effort normal and breath sounds normal. No respiratory distress. She has no wheezes.  Abdominal: Soft. Bowel sounds are normal. She exhibits no distension. There is no tenderness.  Musculoskeletal: She exhibits no edema.  Neurological: She is alert and oriented to person, place, and time. No cranial nerve deficit.  Skin: Skin is warm and dry.  Psychiatric: She has a normal mood and affect.   Labs reviewed: Basic Metabolic Panel:  Recent Labs  16/10/96 0843  NA 143  K 3.9  CL 99  CO2 27  GLUCOSE 168*  BUN 17  CREATININE 0.98  CALCIUM 10.0  TSH 2.480   Liver Function Tests:  Recent Labs  11/23/12 1316  PROT 6.5  Lipid Panel:  Recent Labs  12/04/12 0843  HDL 33*  LDLCALC 83  TRIG 045*  CHOLHDL 4.7*   Lab Results  Component Value Date   HGBA1C 8.3* 12/04/2012   Assessment/Plan 1. Insomnia -requests to restart ambien;  Melatonin not helping - zolpidem (AMBIEN) 5 MG tablet; Take 1 tablet (5 mg total) by mouth at bedtime as needed for sleep.  Dispense: 30 tablet; Refill:  0 -risks of ambien reviewed with her  2. Urinary incontinence, urge --getting worse, wants to start medication--I discussed the side effects and she will fill ditropan  - oxybutynin (DITROPAN-XL) 5 MG 24 hr tablet; Take 1 tablet (5 mg total) by mouth daily.  Dispense: 30 tablet; Refill:  0 - will plan on myrbetriq if she has side effects from the ditropan xl  3. Rheumatoid arthritis(714.0) - plans to follow up with Dr. Corliss Skains about this--may be started on Xeljanz - HYDROcodone-acetaminophen [facility]) 10-325 MG per tablet; Take one tablet every three hours as needed for pain  Dispense: 240 tablet; Refill: 3  4. Chronic low back pain --has spinal stenosis and degenerative disc disease - HYDROcodone-acetaminophen (NORCO) 10-325 MG per tablet; Take one tablet every three hours as needed for pain  Dispense: 240 tablet; Refill: 3 - Basic metabolic panel; Future  5. Type II or unspecified type diabetes mellitus with peripheral circulatory disorders, uncontrolled(250.72) --goal hba1c <7 - Basic metabolic panel; Future - Hemoglobin A1c; Future - Lipid panel; Future - CBC with Differential; Future  6. Unspecified hypothyroidism -continue synthroid, f/u tsh next time  7. Polyneuropathy in other diseases classified elsewhere --cont to f/u with Dr. Anne Hahn  8. Alopecia --likely delayed effect from methotrexate--she is no longer on other meds known to cause this that I am aware of --her hair may also be thinning  Labs/tests ordered: bmp, cbc, hba1c, flp  Next appt:  3 mos

## 2013-03-17 ENCOUNTER — Encounter: Payer: Self-pay | Admitting: Internal Medicine

## 2013-03-22 ENCOUNTER — Other Ambulatory Visit: Payer: Self-pay | Admitting: Internal Medicine

## 2013-03-25 ENCOUNTER — Encounter: Payer: Self-pay | Admitting: Internal Medicine

## 2013-03-29 ENCOUNTER — Other Ambulatory Visit: Payer: Self-pay | Admitting: Geriatric Medicine

## 2013-03-29 MED ORDER — GABAPENTIN 600 MG PO TABS: ORAL_TABLET | ORAL | Status: DC

## 2013-04-01 ENCOUNTER — Ambulatory Visit (INDEPENDENT_AMBULATORY_CARE_PROVIDER_SITE_OTHER): Payer: Medicare Other | Admitting: Nurse Practitioner

## 2013-04-01 ENCOUNTER — Other Ambulatory Visit: Payer: Self-pay | Admitting: Geriatric Medicine

## 2013-04-01 ENCOUNTER — Encounter: Payer: Self-pay | Admitting: Nurse Practitioner

## 2013-04-01 VITALS — BP 152/86 | HR 117 | Temp 99.0°F | Resp 14 | Wt 278.6 lb

## 2013-04-01 DIAGNOSIS — N390 Urinary tract infection, site not specified: Secondary | ICD-10-CM | POA: Diagnosis not present

## 2013-04-01 LAB — POCT URINALYSIS DIPSTICK
Glucose, UA: NEGATIVE
Nitrite, UA: NEGATIVE
Protein, UA: 30
Spec Grav, UA: 1.01
Urobilinogen, UA: NEGATIVE

## 2013-04-01 MED ORDER — PHENAZOPYRIDINE HCL 95 MG PO TABS: 95.0000 mg | ORAL_TABLET | Freq: Three times a day (TID) | ORAL | Status: DC | PRN

## 2013-04-01 MED ORDER — DOXYCYCLINE HYCLATE 100 MG PO TABS: 100.0000 mg | ORAL_TABLET | Freq: Two times a day (BID) | ORAL | Status: DC

## 2013-04-01 MED ORDER — OXYCODONE HCL 5 MG PO TABS: ORAL_TABLET | ORAL | Status: DC

## 2013-04-01 NOTE — Progress Notes (Signed)
Patient ID: Katherine Delgado, female   DOB: Jun 30, 1941, 72 y.o.   MRN: [id number]   Allergies  Allergen Reactions  . Penicillins     REACTION: Reaction not known  . Sulfamethoxazole W-Trimethoprim     REACTION: Reaction not known    Chief Complaint  Patient presents with  . Back Pain    Complains of possible UTI  . Fever    HPI: Patient is a 72 y.o. female seen in the office today for possible UTI. Reports pain with urination, suprapubic discomfort, low grade temp of 99, back pain, frequency with urination. Has been going on for 5 days. Trying to flush out any bacteria with water. Has hydrocodone that she is taking to help with pain. No nausea or vomiting.  Review of Systems:  Review of Systems  Constitutional: Positive for fever. Negative for chills and malaise/fatigue.  Respiratory: Negative for cough and shortness of breath.   Cardiovascular: Negative for chest pain and palpitations.  Gastrointestinal: Negative for abdominal pain, diarrhea and constipation.  Genitourinary: Positive for dysuria, urgency, frequency and flank pain. Negative for hematuria.  Musculoskeletal: Positive for myalgias.  Neurological: Negative for weakness.     Past Medical History  Diagnosis Date  . COPD (chronic obstructive pulmonary disease)   . Hypertension   . RA (rheumatoid arthritis)   . Thyroid disease   . Miscarriage 1962  . History of benign thymus tumor   . Back injury   . GERD (gastroesophageal reflux disease)   . Arthritis   . Fibromyalgia   . Diabetes mellitus without complication   . Thyroid disorder   . Acute infective polyneuritis 2002  . Guillain-Barre syndrome   . Polyneuropathy in diabetes(357.2)   . Morbid obesity   . Spondylosis, lumbosacral   . Degenerative arthritis   . Dyslipidemia   . Spinal stenosis, lumbar region, without neurogenic claudication   . Candidiasis of skin and nails   . Acute maxillary sinusitis   . Insomnia, unspecified   . Candidiasis of vulva and  vagina   . Edema   . Acute sinusitis, unspecified   . Obstructive chronic bronchitis with acute bronchitis   . Urinary tract infection, site not specified   . Unspecified hypothyroidism   . Type II or unspecified type diabetes mellitus with peripheral circulatory disorders, uncontrolled(250.72)   . Mixed hyperlipidemia   . Other specified disease of white blood cells   . Depressive disorder, not elsewhere classified   . Chronic pain syndrome   . Tear film insufficiency, unspecified   . Unspecified essential hypertension   . Allergic rhinitis due to pollen   . Unspecified chronic bronchitis   . Reflux esophagitis   . Diaphragmatic hernia without mention of obstruction or gangrene   . Unspecified pruritic disorder   . Rheumatoid arthritis(714.0)   . Pain in joint, multiple sites   . Stiffness of joints, not elsewhere classified, multiple sites   . Lumbago   . Other malaise and fatigue   . Spontaneous ecchymoses   . Abnormal weight gain   . Shortness of breath   . Cough   . Unspecified urinary incontinence    Past Surgical History  Procedure Laterality Date  . Abdominal hysterectomy  1974  . Tonsillectomy    . Cholecystectomy  1984  . Back surgery  1982  . Ovarian cyst surgery  1968  . Thymus tumor    . Knee surgery Bilateral 08/27/2010 (L) and 01/11/2011 (R)  . Miscarrage  1962  . Bronchoscopy  2001  . Abdominal tumor  2002  . Thymus tumor  10/2000   Social History:   reports that she quit smoking about 33 years ago. Her smoking use included Cigarettes. She smoked 0.00 packs per day. She does not have any smokeless tobacco history on file. She reports that she does not drink alcohol or use illicit drugs.  Family History  Problem Relation Age of Onset  . Alzheimer's disease Mother   . Heart disease Mother   . Heart disease Father   . Liver disease Father   . Cancer Sister     Medications: Patient's Medications  New Prescriptions   No medications on file   Previous Medications   ASPIRIN EC 81 MG TABLET    Take 81 mg by mouth daily as needed.    BECLOMETHASONE (QVAR) 80 MCG/ACT INHALER    Inhale 1 puff into the lungs 2 (two) times daily as needed. For wheezing   CHOLECALCIFEROL (VITAMIN D-3) 5000 UNITS TABS    Take by mouth daily. Inject 70 units at bedtime for diabetes Dx. 250.72   FOLIC ACID (FOLVITE) 400 MCG TABLET    Take 400 mcg by mouth 2 (two) times daily.   FUROSEMIDE (LASIX) 20 MG TABLET    Take 20 mg by mouth 2 (two) times daily. Use as needed for 3 pounds gain in one day or 5 pounds in one week   GABAPENTIN (NEURONTIN) 600 MG TABLET    Take one tablet by mouth twice daily.   GLUCOSE BLOOD (ACCU-CHEK AVIVA) TEST STRIP    Use as instructed to check glucose 3 to 4 times daily to control blood sugar. 250.00   HYDROCODONE-ACETAMINOPHEN (NORCO) 10-325 MG PER TABLET    Take one tablet every three hours as needed for pain   INSULIN ASPART (NOVOLOG FLEXPEN) 100 UNIT/ML INJECTION    Inject 16 Units into the skin 3 (three) times daily before meals.   INSULIN GLARGINE (LANTUS) 100 UNIT/ML INJECTION    Inject into the skin at bedtime. Inject 70 units at bedtime for diabe   IPRATROPIUM-ALBUTEROL (DUONEB) 0.5-2.5 (3) MG/3ML SOLN    Take 3 mLs by nebulization. Use every 6 hours as needed for wheezing and SOB   LIDOCAINE (LIDODERM) 5 %    Place 1 patch onto the skin as needed.   NYSTATIN (NYAMYC) 100000 UNIT/GM POWD    Apply 1 g topically daily as needed (for itching).    NYSTATIN-TRIAMCINOLONE (MYCOLOG II) CREAM    Apply topically 4 (four) times daily. Apply under abdomen folds twice daily until rash healed   OMEGA-3 ACID ETHYL ESTERS (LOVAZA) 1 G CAPSULE    Take 1 capsule (1 g total) by mouth daily.   OXYBUTYNIN (DITROPAN-XL) 5 MG 24 HR TABLET    Take 1 tablet (5 mg total) by mouth daily.   OXYCODONE (OXY IR/ROXICODONE) 5 MG IMMEDIATE RELEASE TABLET    Take one tablet every 6 hours as needed for severe pain, Take 2 tablets at bedtime.   POLYVINYL  ALCOHOL (LIQUIFILM TEARS) 1.4 % OPHTHALMIC SOLUTION    Place 1 drop into both eyes as needed (for dry eyes).    POTASSIUM CHLORIDE (MICRO-K) 10 MEQ CR CAPSULE    Take 1 capsule (10 mEq total) by mouth 2 (two) times daily.   PROMETHAZINE (PHENERGAN) 12.5 MG TABLET    Take 12.5 mg by mouth every 6 (six) hours as needed.    SYNTHROID 200 MCG TABLET    Take 1 tablet (200 mcg total) by  mouth daily before breakfast.   TIOTROPIUM (SPIRIVA) 18 MCG INHALATION CAPSULE    Place 1 capsule (18 mcg total) into inhaler and inhale daily.   TRIAMTERENE-HYDROCHLOROTHIAZIDE (MAXZIDE-25) 37.5-25 MG PER TABLET    Take 1 tablet by mouth daily. Take one tablet once a day for blood pressure   ZOLPIDEM (AMBIEN) 5 MG TABLET    Take 1 tablet (5 mg total) by mouth at bedtime as needed for sleep.  Modified Medications   No medications on file  Discontinued Medications   SYNTHROID 200 MCG TABLET    TAKE 1 TABLET BY MOUTH DAILY FIRST THING ON AN EMPTY STOMACH, SEPARATE FROM OTHER MEDS     Physical Exam:  Filed Vitals:   04/01/13 1350  BP: 152/86  Pulse: 117  Temp: 99 F (37.2 C)  TempSrc: Oral  Resp: 14  Weight: 278 lb 9.6 oz (126.372 kg)    Physical Exam  Constitutional: She is oriented to person, place, and time and well-developed, well-nourished, and in no distress. No distress.  Cardiovascular: Regular rhythm.   Rapid heart rate  Pulmonary/Chest: Effort normal and breath sounds normal. No respiratory distress. She has no wheezes.  Abdominal: Soft. Bowel sounds are normal. She exhibits no distension. Tenderness: suprapubic discomfort.  Neurological: She is alert and oriented to person, place, and time.  Skin: Skin is warm and dry. She is not diaphoretic.     Labs reviewed: Basic Metabolic Panel:  Recent Labs  16/10/96 0843  NA 143  K 3.9  CL 99  CO2 27  GLUCOSE 168*  BUN 17  CREATININE 0.98  CALCIUM 10.0  TSH 2.480   Liver Function Tests:  Recent Labs  11/23/12 1316  PROT 6.5   No  results found for this basename: LIPASE, AMYLASE,  in the last 8760 hours No results found for this basename: AMMONIA,  in the last 8760 hours CBC: No results found for this basename: WBC, NEUTROABS, HGB, HCT, MCV, PLT,  in the last 8760 hours Lipid Panel:  Recent Labs  12/04/12 0843  HDL 33*  LDLCALC 83  TRIG 045*  CHOLHDL 4.7*    Past Procedures: Urinalysis Results for MAGGY, WYBLE (MRN [id number]) as of 04/01/2013 14:02  Ref. Range 04/01/2013 13:57  Color, UA No range found amber  Specific Gravity, UA No range found 1.010  pH, UA No range found 7.0  Glucose Latest Range: NEGATIVE mg/dL negative  Bilirubin, UA No range found negative  Ketones, UA No range found negative  Clarity, UA No range found cloudy  Protein, UA No range found 30  Urobilinogen, UA Latest Range: 0.0-1.0 mg/dL negative  Nitrite, UA No range found negative  Leukocytes, UA No range found 4+  RBC, UA No range found 50    Assessment/Plan 1. Urinary tract infection, site not specified Educated to increase water intake and make sure to stay hydrated- will start on doxycycline and get UA C&S; pt educated if symptoms do not improve or she gets worse to go to the ED, including worsening fevers, feelings of fatigue and malaise, unable to eat or drink. Pt understands and agrees.  - POC Urinalysis Dipstick and Urine culture - phenazopyridine (PYRIDIUM) 95 MG tablet; Take 1 tablet (95 mg total) by mouth 3 (three) times daily as needed for pain.  Dispense: 10 tablet; Refill: 0 - doxycycline (VIBRA-TABS) 100 MG tablet; Take 1 tablet (100 mg total) by mouth 2 (two) times daily.  Dispense: 20 tablet; Refill: 0 - CBC With differential/Platelet - Comprehensive metabolic  panel

## 2013-04-01 NOTE — Patient Instructions (Addendum)
Doxycycline 100 mg take 1 tablet twice daily for 10 days (this has already been sent to pharm) Pyridium 1 tablet every 8 hours as needed for pain.  Will get blood work. Cont to stay hydrated with water/cranberry juice (low sugar) or may take cranberry tablet daily   Urinary Tract Infection Urinary tract infections (UTIs) can develop anywhere along your urinary tract. Your urinary tract is your body's drainage system for removing wastes and extra water. Your urinary tract includes two kidneys, two ureters, a bladder, and a urethra. Your kidneys are a pair of bean-shaped organs. Each kidney is about the size of your fist. They are located below your ribs, one on each side of your spine. CAUSES Infections are caused by microbes, which are microscopic organisms, including fungi, viruses, and bacteria. These organisms are so small that they can only be seen through a microscope. Bacteria are the microbes that most commonly cause UTIs. SYMPTOMS  Symptoms of UTIs may vary by age and gender of the patient and by the location of the infection. Symptoms in young women typically include a frequent and intense urge to urinate and a painful, burning feeling in the bladder or urethra during urination. Older women and men are more likely to be tired, shaky, and weak and have muscle aches and abdominal pain. A fever may mean the infection is in your kidneys. Other symptoms of a kidney infection include pain in your back or sides below the ribs, nausea, and vomiting. DIAGNOSIS To diagnose a UTI, your caregiver will ask you about your symptoms. Your caregiver also will ask to provide a urine sample. The urine sample will be tested for bacteria and white blood cells. White blood cells are made by your body to help fight infection. TREATMENT  Typically, UTIs can be treated with medication. Because most UTIs are caused by a bacterial infection, they usually can be treated with the use of antibiotics. The choice of  antibiotic and length of treatment depend on your symptoms and the type of bacteria causing your infection. HOME CARE INSTRUCTIONS  If you were prescribed antibiotics, take them exactly as your caregiver instructs you. Finish the medication even if you feel better after you have only taken some of the medication.  Drink enough water and fluids to keep your urine clear or pale yellow.  Avoid caffeine, tea, and carbonated beverages. They tend to irritate your bladder.  Empty your bladder often. Avoid holding urine for long periods of time.  Empty your bladder before and after sexual intercourse.  After a bowel movement, women should cleanse from front to back. Use each tissue only once. SEEK MEDICAL CARE IF:   You have back pain.  You develop a fever.  Your symptoms do not begin to resolve within 3 days. SEEK IMMEDIATE MEDICAL CARE IF:   You have severe back pain or lower abdominal pain.  You develop chills.  You have nausea or vomiting.  You have continued burning or discomfort with urination. MAKE SURE YOU:   Understand these instructions.  Will watch your condition.  Will get help right away if you are not doing well or get worse. Document Released: 05/18/2005 Document Revised: 02/07/2012 Document Reviewed: 09/16/2011 [facility] Patient Information 2014 [employer], [place].

## 2013-04-02 LAB — COMPREHENSIVE METABOLIC PANEL
AST: 16 IU/L (ref 0–40)
Albumin: 3.7 g/dL (ref 3.5–4.8)
Alkaline Phosphatase: 120 IU/L — ABNORMAL HIGH (ref 39–117)
BUN/Creatinine Ratio: 21 (ref 11–26)
BUN: 17 mg/dL (ref 8–27)
CO2: 27 mmol/L (ref 18–29)
Calcium: 9.4 mg/dL (ref 8.6–10.2)
Chloride: 96 mmol/L — ABNORMAL LOW (ref 97–108)
Creatinine, Ser: 0.8 mg/dL (ref 0.57–1.00)
Globulin, Total: 2.4 g/dL (ref 1.5–4.5)
Sodium: 137 mmol/L (ref 134–144)

## 2013-04-02 LAB — CBC WITH DIFFERENTIAL
Basophils Absolute: 0.1 10*3/uL (ref 0.0–0.2)
Eos: 2 % (ref 0–5)
Immature Grans (Abs): 0 10*3/uL (ref 0.0–0.1)
Immature Granulocytes: 0 % (ref 0–2)
Monocytes: 10 % (ref 4–12)
Neutrophils Relative %: 79 % — ABNORMAL HIGH (ref 40–74)
Platelets: 407 10*3/uL — ABNORMAL HIGH (ref 150–379)
RBC: 5.32 x10E6/uL — ABNORMAL HIGH (ref 3.77–5.28)
RDW: 14.6 % (ref 12.3–15.4)
WBC: 15.4 10*3/uL — ABNORMAL HIGH (ref 3.4–10.8)

## 2013-04-03 ENCOUNTER — Other Ambulatory Visit: Payer: Self-pay | Admitting: Geriatric Medicine

## 2013-04-03 LAB — URINE CULTURE

## 2013-04-03 MED ORDER — CIPROFLOXACIN HCL 500 MG PO TABS: 500.0000 mg | ORAL_TABLET | Freq: Two times a day (BID) | ORAL | Status: DC

## 2013-04-03 NOTE — Progress Notes (Signed)
Patient is not feeling better. Per Shanda Bumps switch to Cipro 500 mg BID. I sent new prescription to pharmacy and patient is aware.

## 2013-04-15 ENCOUNTER — Telehealth: Payer: Self-pay | Admitting: Neurology

## 2013-04-15 DIAGNOSIS — M47817 Spondylosis without myelopathy or radiculopathy, lumbosacral region: Secondary | ICD-10-CM

## 2013-04-16 ENCOUNTER — Ambulatory Visit (INDEPENDENT_AMBULATORY_CARE_PROVIDER_SITE_OTHER): Payer: Medicare Other | Admitting: Nurse Practitioner

## 2013-04-16 ENCOUNTER — Other Ambulatory Visit: Payer: Medicare Other

## 2013-04-16 ENCOUNTER — Other Ambulatory Visit: Payer: Self-pay | Admitting: *Deleted

## 2013-04-16 ENCOUNTER — Encounter: Payer: Self-pay | Admitting: Nurse Practitioner

## 2013-04-16 VITALS — BP 150/74 | HR 111 | Temp 99.4°F | Resp 18 | Ht 68.0 in | Wt 272.6 lb

## 2013-04-16 DIAGNOSIS — M545 Low back pain, unspecified: Secondary | ICD-10-CM | POA: Diagnosis not present

## 2013-04-16 DIAGNOSIS — N390 Urinary tract infection, site not specified: Secondary | ICD-10-CM | POA: Diagnosis not present

## 2013-04-16 DIAGNOSIS — G8929 Other chronic pain: Secondary | ICD-10-CM | POA: Diagnosis not present

## 2013-04-16 DIAGNOSIS — R000 Tachycardia, unspecified: Secondary | ICD-10-CM | POA: Diagnosis not present

## 2013-04-16 DIAGNOSIS — K219 Gastro-esophageal reflux disease without esophagitis: Secondary | ICD-10-CM | POA: Diagnosis not present

## 2013-04-16 DIAGNOSIS — E1159 Type 2 diabetes mellitus with other circulatory complications: Secondary | ICD-10-CM | POA: Diagnosis not present

## 2013-04-16 MED ORDER — PANTOPRAZOLE SODIUM 40 MG PO TBEC: 40.0000 mg | DELAYED_RELEASE_TABLET | Freq: Every day | ORAL | Status: DC

## 2013-04-16 MED ORDER — OMEGA-3-ACID ETHYL ESTERS 1 G PO CAPS: 1.0000 g | ORAL_CAPSULE | Freq: Every day | ORAL | Status: DC

## 2013-04-16 NOTE — Progress Notes (Signed)
Patient ID: Katherine Delgado, female   DOB: Oct 18, 1940, 73 y.o.   MRN: [id number]   Allergies  Allergen Reactions  . Penicillins     REACTION: Reaction not known  . Sulfamethoxazole W-Trimethoprim     REACTION: Reaction not known    Chief Complaint  Patient presents with  . Acute Visit    mucus lump,mucus is light green,nausea    HPI: Patient is a 72 y.o. female seen in the office today for feels like she is being choked and there is something in her throat and she feels nauseous for a week and a half but has been an ongoing issues. Reports it is more difficult to swallow and when she cough she feels like something comes up. Worse when she eats. Pt reports she has acid reflux and HH. Currently she is not taking any medication for acid reflux and she just watches what she eats. Denies constipation and diarrhea.  Recently treated for UTI with cipro- finished this 4 days ago.  Reports feelings of bronchitis and cough but does not have any sputum. Reports worsening shortness of breath but this is mild.   Review of Systems:  Review of Systems  Constitutional: Negative for fever and chills.  HENT: Negative for congestion and sore throat.   Respiratory: Positive for shortness of breath (mild). Negative for cough and sputum production.   Cardiovascular: Negative for chest pain, palpitations and leg swelling.  Gastrointestinal: Positive for heartburn and nausea. Negative for vomiting, abdominal pain, diarrhea and constipation.  Genitourinary: Positive for dysuria. Negative for urgency and frequency.       Dysuria improved but not resolved- took Cipro for 10 days for UTI  Musculoskeletal: Positive for back pain (chronic pain in back and legs).  Skin: Negative.   Neurological: Negative for headaches.     Past Medical History  Diagnosis Date  . COPD (chronic obstructive pulmonary disease)   . Hypertension   . RA (rheumatoid arthritis)   . Thyroid disease   . Miscarriage 1962  . History of  benign thymus tumor   . Back injury   . GERD (gastroesophageal reflux disease)   . Arthritis   . Fibromyalgia   . Diabetes mellitus without complication   . Thyroid disorder   . Acute infective polyneuritis 2002  . Guillain-Barre syndrome   . Polyneuropathy in diabetes(357.2)   . Morbid obesity   . Spondylosis, lumbosacral   . Degenerative arthritis   . Dyslipidemia   . Spinal stenosis, lumbar region, without neurogenic claudication   . Candidiasis of skin and nails   . Acute maxillary sinusitis   . Insomnia, unspecified   . Candidiasis of vulva and vagina   . Edema   . Acute sinusitis, unspecified   . Obstructive chronic bronchitis with acute bronchitis   . Urinary tract infection, site not specified   . Unspecified hypothyroidism   . Type II or unspecified type diabetes mellitus with peripheral circulatory disorders, uncontrolled(250.72)   . Mixed hyperlipidemia   . Other specified disease of white blood cells   . Depressive disorder, not elsewhere classified   . Chronic pain syndrome   . Tear film insufficiency, unspecified   . Unspecified essential hypertension   . Allergic rhinitis due to pollen   . Unspecified chronic bronchitis   . Reflux esophagitis   . Diaphragmatic hernia without mention of obstruction or gangrene   . Unspecified pruritic disorder   . Rheumatoid arthritis(714.0)   . Pain in joint, multiple sites   .  Stiffness of joints, not elsewhere classified, multiple sites   . Lumbago   . Other malaise and fatigue   . Spontaneous ecchymoses   . Abnormal weight gain   . Shortness of breath   . Cough   . Unspecified urinary incontinence    Past Surgical History  Procedure Laterality Date  . Abdominal hysterectomy  1974  . Tonsillectomy    . Cholecystectomy  1984  . Back surgery  1982  . Ovarian cyst surgery  1968  . Thymus tumor    . Knee surgery Bilateral 08/27/2010 (L) and 01/11/2011 (R)  . Miscarrage  1962  . Bronchoscopy  2001  . Abdominal  tumor  2002  . Thymus tumor  10/2000   Social History:   reports that she quit smoking about 33 years ago. Her smoking use included Cigarettes. She smoked 0.00 packs per day. She does not have any smokeless tobacco history on file. She reports that she does not drink alcohol or use illicit drugs.  Family History  Problem Relation Age of Onset  . Alzheimer's disease Mother   . Heart disease Mother   . Heart disease Father   . Liver disease Father   . Cancer Sister     Medications: Patient's Medications  New Prescriptions   No medications on file  Previous Medications   ASPIRIN EC 81 MG TABLET    Take 81 mg by mouth daily as needed.    BECLOMETHASONE (QVAR) 80 MCG/ACT INHALER    Inhale 1 puff into the lungs 2 (two) times daily as needed. For wheezing   CHOLECALCIFEROL (VITAMIN D-3) 5000 UNITS TABS    Take by mouth daily. Inject 70 units at bedtime for diabetes Dx. 250.72   FOLIC ACID (FOLVITE) 400 MCG TABLET    Take 400 mcg by mouth 2 (two) times daily as needed.    FUROSEMIDE (LASIX) 20 MG TABLET    Take 20 mg by mouth 2 (two) times daily as needed. Use as needed for 3 pounds gain in one day or 5 pounds in one week   GABAPENTIN (NEURONTIN) 600 MG TABLET    Take one tablet by mouth twice daily.   GLUCOSE BLOOD (ACCU-CHEK AVIVA) TEST STRIP    Use as instructed to check glucose 3 to 4 times daily to control blood sugar. 250.00   HYDROCODONE-ACETAMINOPHEN (NORCO) 10-325 MG PER TABLET    Take one tablet every three hours as needed for pain   INSULIN ASPART (NOVOLOG FLEXPEN) 100 UNIT/ML INJECTION    Inject 16 Units into the skin 3 (three) times daily before meals.   INSULIN GLARGINE (LANTUS) 100 UNIT/ML INJECTION    Inject into the skin at bedtime. Inject 70 units at bedtime for diabe   IPRATROPIUM-ALBUTEROL (DUONEB) 0.5-2.5 (3) MG/3ML SOLN    Take 3 mLs by nebulization. Use every 6 hours as needed for wheezing and SOB   LIDOCAINE (LIDODERM) 5 %    Place 1 patch onto the skin as needed.    NYSTATIN (NYAMYC) 100000 UNIT/GM POWD    Apply 1 g topically daily as needed (for itching).    NYSTATIN-TRIAMCINOLONE (MYCOLOG II) CREAM    Apply topically 4 (four) times daily. Apply under abdomen folds twice daily until rash healed   OXYBUTYNIN (DITROPAN-XL) 5 MG 24 HR TABLET    Take 1 tablet (5 mg total) by mouth daily.   OXYCODONE (OXY IR/ROXICODONE) 5 MG IMMEDIATE RELEASE TABLET    Take one tablet every 6 hours as needed for severe  pain, Take 2 tablets at bedtime.   PHENAZOPYRIDINE (PYRIDIUM) 95 MG TABLET    Take 1 tablet (95 mg total) by mouth 3 (three) times daily as needed for pain.   POLYVINYL ALCOHOL (LIQUIFILM TEARS) 1.4 % OPHTHALMIC SOLUTION    Place 1 drop into both eyes as needed (for dry eyes).    POTASSIUM CHLORIDE (MICRO-K) 10 MEQ CR CAPSULE    Take 1 capsule (10 mEq total) by mouth 2 (two) times daily.   PROMETHAZINE (PHENERGAN) 12.5 MG TABLET    Take 12.5 mg by mouth every 6 (six) hours as needed.    SYNTHROID 200 MCG TABLET    Take 1 tablet (200 mcg total) by mouth daily before breakfast.   TIOTROPIUM (SPIRIVA) 18 MCG INHALATION CAPSULE    Place 1 capsule (18 mcg total) into inhaler and inhale daily.   TRIAMTERENE-HYDROCHLOROTHIAZIDE (MAXZIDE-25) 37.5-25 MG PER TABLET    Take 1 tablet by mouth daily. Take one tablet once a day for blood pressure   ZOLPIDEM (AMBIEN) 5 MG TABLET    Take 1 tablet (5 mg total) by mouth at bedtime as needed for sleep.  Modified Medications   Modified Medication Previous Medication   OMEGA-3 ACID ETHYL ESTERS (LOVAZA) 1 G CAPSULE omega-3 acid ethyl esters (LOVAZA) 1 G capsule      Take 1 capsule (1 g total) by mouth daily.    Take 1 capsule (1 g total) by mouth daily.  Discontinued Medications   CIPROFLOXACIN (CIPRO) 500 MG TABLET    Take 1 tablet (500 mg total) by mouth 2 (two) times daily. For 10 days   DOXYCYCLINE (VIBRA-TABS) 100 MG TABLET    Take 1 tablet (100 mg total) by mouth 2 (two) times daily.     Physical Exam:  Filed Vitals:    04/16/13 0852  BP: 150/74  Pulse: 111  Temp: 99.4 F (37.4 C)  TempSrc: Oral  Resp: 18  Height: 5\' 8"  (1.727 m)  Weight: 272 lb 9.6 oz (123.651 kg)  SpO2: 95%    Physical Exam  Constitutional: She is oriented to person, place, and time and well-developed, well-nourished, and in no distress. No distress.  Cardiovascular: Regular rhythm and normal heart sounds.   HR remains rapid  Pulmonary/Chest: Effort normal and breath sounds normal. No respiratory distress.  Abdominal: Soft. Bowel sounds are normal. She exhibits no distension. There is no tenderness.  Neurological: She is alert and oriented to person, place, and time.  Skin: Skin is warm and dry. She is not diaphoretic.  Psychiatric: Affect normal.     Labs reviewed: Basic Metabolic Panel:  Recent Labs  45/40/98 0843 04/01/13 1426  NA 143 137  K 3.9 4.4  CL 99 96*  CO2 27 27  GLUCOSE 168* 252*  BUN 17 17  CREATININE 0.98 0.80  CALCIUM 10.0 9.4  TSH 2.480  --    Liver Function Tests:  Recent Labs  11/23/12 1316 04/01/13 1426  AST  --  16  ALT  --  22  ALKPHOS  --  120*  BILITOT  --  0.2  PROT 6.5 6.1   No results found for this basename: LIPASE, AMYLASE,  in the last 8760 hours No results found for this basename: AMMONIA,  in the last 8760 hours CBC:  Recent Labs  04/01/13 1426  WBC 15.4*  NEUTROABS 12.1*  HGB 13.4  HCT 42.0  MCV 79  PLT 407*   Lipid Panel:  Recent Labs  12/04/12 0843  HDL 33*  LDLCALC 83  TRIG 191*  CHOLHDL 4.7*    Assessment/Plan 1. GERD (gastroesophageal reflux disease) History of acid reflux and HH with this and symptoms of cough will start medication at this time; to cont nonpharm measures like diet and remaining upright after meals - pantoprazole (PROTONIX) 40 MG tablet; Take 1 tablet (40 mg total) by mouth daily.  Dispense: 30 tablet; Refill: 0  2. UTI (urinary tract infection) Still having dysuria- has finished 10 days of cipro; will follow up labs today CBC  and BMP - Urine culture - Urinalysis  3. Tachycardia Looking back pt has frequent episodes of increased HR in office- TSH normal in April, checking CBC and BMP; EKG today which was WNL  To follow up in 1 month

## 2013-04-16 NOTE — Patient Instructions (Addendum)
Follow up in 1 month   Increase hydration  Will check urine today to make sure UTI has resolved  To start protonix daily to help with symptoms

## 2013-04-16 NOTE — Telephone Encounter (Signed)
I'll get one more epidural steroid injection set up. This represents 3 for the summer.

## 2013-04-17 LAB — BASIC METABOLIC PANEL
BUN/Creatinine Ratio: 14 (ref 11–26)
BUN: 14 mg/dL (ref 8–27)
CO2: 25 mmol/L (ref 18–29)
Calcium: 9.6 mg/dL (ref 8.6–10.2)
Chloride: 97 mmol/L (ref 97–108)
Creatinine, Ser: 1.01 mg/dL — ABNORMAL HIGH (ref 0.57–1.00)
GFR calc Af Amer: 64 mL/min/{1.73_m2} (ref >59)
GFR calc non Af Amer: 56 mL/min/{1.73_m2} — ABNORMAL LOW (ref >59)
Glucose: 170 mg/dL — ABNORMAL HIGH (ref 65–99)
Potassium: 4.5 mmol/L (ref 3.5–5.2)
Sodium: 141 mmol/L (ref 134–144)

## 2013-04-17 LAB — CBC WITH DIFFERENTIAL/PLATELET
Basophils Absolute: 0.1 10*3/uL (ref 0.0–0.2)
Basos: 1 % (ref 0–3)
Eos: 3 % (ref 0–5)
Eosinophils Absolute: 0.4 10*3/uL (ref 0.0–0.4)
HCT: 43.7 % (ref 34.0–46.6)
Hemoglobin: 14.1 g/dL (ref 11.1–15.9)
Immature Grans (Abs): 0 10*3/uL (ref 0.0–0.1)
Immature Granulocytes: 0 % (ref 0–2)
Lymphocytes Absolute: 2.5 10*3/uL (ref 0.7–3.1)
Lymphs: 17 % (ref 14–46)
MCH: 25.7 pg — ABNORMAL LOW (ref 26.6–33.0)
MCHC: 32.3 g/dL (ref 31.5–35.7)
MCV: 80 fL (ref 79–97)
Monocytes Absolute: 1 10*3/uL — ABNORMAL HIGH (ref 0.1–0.9)
Monocytes: 7 % (ref 4–12)
Neutrophils Absolute: 10.7 10*3/uL — ABNORMAL HIGH (ref 1.4–7.0)
Neutrophils Relative %: 72 % (ref 40–74)
RBC: 5.48 x10E6/uL — ABNORMAL HIGH (ref 3.77–5.28)
RDW: 14.4 % (ref 12.3–15.4)
WBC: 14.8 10*3/uL — ABNORMAL HIGH (ref 3.4–10.8)

## 2013-04-17 LAB — LIPID PANEL
Chol/HDL Ratio: 6.2 ratio units — ABNORMAL HIGH (ref 0.0–4.4)
Cholesterol, Total: 143 mg/dL (ref 100–199)
HDL: 23 mg/dL — ABNORMAL LOW (ref >39)
LDL Calculated: 89 mg/dL (ref 0–99)
Triglycerides: 157 mg/dL — ABNORMAL HIGH (ref 0–149)
VLDL Cholesterol Cal: 31 mg/dL (ref 5–40)

## 2013-04-17 LAB — URINALYSIS
Bilirubin, UA: NEGATIVE
Protein, UA: NEGATIVE
Urobilinogen, Ur: 0.2 mg/dL (ref 0.0–1.9)
pH, UA: 6.5 (ref 5.0–7.5)

## 2013-04-17 LAB — HEMOGLOBIN A1C
Est. average glucose Bld gHb Est-mCnc: 217 mg/dL
Hgb A1c MFr Bld: 9.2 % — ABNORMAL HIGH (ref 4.8–5.6)

## 2013-04-17 LAB — URINE CULTURE: Organism ID, Bacteria: NO GROWTH

## 2013-04-18 ENCOUNTER — Other Ambulatory Visit: Payer: Self-pay | Admitting: Neurology

## 2013-04-18 DIAGNOSIS — M47817 Spondylosis without myelopathy or radiculopathy, lumbosacral region: Secondary | ICD-10-CM

## 2013-04-19 ENCOUNTER — Ambulatory Visit (INDEPENDENT_AMBULATORY_CARE_PROVIDER_SITE_OTHER): Payer: Medicare Other | Admitting: Neurology

## 2013-04-19 ENCOUNTER — Encounter: Payer: Self-pay | Admitting: Neurology

## 2013-04-19 VITALS — BP 147/88 | HR 113 | Wt 272.0 lb

## 2013-04-19 DIAGNOSIS — R269 Unspecified abnormalities of gait and mobility: Secondary | ICD-10-CM | POA: Diagnosis not present

## 2013-04-19 DIAGNOSIS — M545 Low back pain, unspecified: Secondary | ICD-10-CM | POA: Diagnosis not present

## 2013-04-19 DIAGNOSIS — G8929 Other chronic pain: Secondary | ICD-10-CM

## 2013-04-19 DIAGNOSIS — G609 Hereditary and idiopathic neuropathy, unspecified: Secondary | ICD-10-CM | POA: Diagnosis not present

## 2013-04-19 DIAGNOSIS — G544 Lumbosacral root disorders, not elsewhere classified: Secondary | ICD-10-CM | POA: Diagnosis not present

## 2013-04-19 NOTE — Progress Notes (Signed)
Reason for visit: Low back pain  Katherine Delgado is an 72 y.o. female  History of present illness:  Katherine Delgado is a 72 year old right-handed white female with a history of obesity, diabetes, peripheral neuropathy, and rheumatoid arthritis. The patient has had prior lumbosacral spine surgery at L5-S1 level. The patient indicates that she has severe low back pain, and pain tracking up into the lower thoracic area with standing. The patient will develop pain into both hips, and down both legs to the feet. The pain comes on rapidly, and tends to improve when she sits or lies down. The patient has unusual symptoms, indicating that she has pain going down from the neck to the mid back and low back when she eats and swallows. The patient feels as if the arms and the legs are becoming weak, and she has difficulty supporting herself with walking. The patient was using a cane for ambulation 3 months ago, but now she has to use a wheelchair for mobility. The patient does have some urinary frequency and incontinence, and she just recently was treated for a urinary tract infection. The patient indicates when she has a bowel movement, she will have pain going up from the low back to the neck area. The patient returns for an evaluation. The patient has had 2 epidural steroid injections with some transient benefit. The patient has been set up for a third injection.  Past Medical History  Diagnosis Date  . COPD (chronic obstructive pulmonary disease)   . Hypertension   . RA (rheumatoid arthritis)   . Thyroid disease   . Miscarriage 1962  . History of benign thymus tumor   . Back injury   . GERD (gastroesophageal reflux disease)   . Arthritis   . Fibromyalgia   . Diabetes mellitus without complication   . Thyroid disorder   . Acute infective polyneuritis 2002  . Guillain-Barre syndrome   . Polyneuropathy in diabetes(357.2)   . Morbid obesity   . Spondylosis, lumbosacral   . Degenerative arthritis   .  Dyslipidemia   . Spinal stenosis, lumbar region, without neurogenic claudication   . Candidiasis of skin and nails   . Acute maxillary sinusitis   . Insomnia, unspecified   . Candidiasis of vulva and vagina   . Edema   . Acute sinusitis, unspecified   . Obstructive chronic bronchitis with acute bronchitis   . Urinary tract infection, site not specified   . Unspecified hypothyroidism   . Type II or unspecified type diabetes mellitus with peripheral circulatory disorders, uncontrolled(250.72)   . Mixed hyperlipidemia   . Other specified disease of white blood cells   . Depressive disorder, not elsewhere classified   . Chronic pain syndrome   . Tear film insufficiency, unspecified   . Unspecified essential hypertension   . Allergic rhinitis due to pollen   . Unspecified chronic bronchitis   . Reflux esophagitis   . Diaphragmatic hernia without mention of obstruction or gangrene   . Unspecified pruritic disorder   . Rheumatoid arthritis(714.0)   . Pain in joint, multiple sites   . Stiffness of joints, not elsewhere classified, multiple sites   . Lumbago   . Other malaise and fatigue   . Spontaneous ecchymoses   . Abnormal weight gain   . Shortness of breath   . Cough   . Unspecified urinary incontinence     Past Surgical History  Procedure Laterality Date  . Abdominal hysterectomy  1974  . Tonsillectomy    .  Cholecystectomy  1984  . Back surgery  1982  . Ovarian cyst surgery  1968  . Thymus tumor    . Knee surgery Bilateral 08/27/2010 (L) and 01/11/2011 (R)  . Miscarrage  1962  . Bronchoscopy  2001  . Abdominal tumor  2002  . Thymus tumor  10/2000    Family History  Problem Relation Age of Onset  . Alzheimer's disease Mother   . Heart disease Mother   . Heart disease Father   . Liver disease Father   . Cancer Sister     Social history:  reports that she quit smoking about 33 years ago. Her smoking use included Cigarettes. She smoked 0.00 packs per day. She does  not have any smokeless tobacco history on file. She reports that she does not drink alcohol or use illicit drugs.    Allergies  Allergen Reactions  . Penicillins     REACTION: Reaction not known  . Sulfamethoxazole W-Trimethoprim     REACTION: Reaction not known    Medications:  Current Outpatient Prescriptions on File Prior to Visit  Medication Sig Dispense Refill  . aspirin EC 81 MG tablet Take 81 mg by mouth daily as needed.       . beclomethasone (QVAR) 80 MCG/ACT inhaler Inhale 1 puff into the lungs 2 (two) times daily as needed. For wheezing      . Cholecalciferol (VITAMIN D-3) 5000 UNITS TABS Take by mouth daily. Inject 70 units at bedtime for diabetes Dx. 250.72      . folic acid (FOLVITE) 400 MCG tablet Take 400 mcg by mouth 2 (two) times daily as needed.       . furosemide (LASIX) 20 MG tablet Take 20 mg by mouth 2 (two) times daily as needed. Use as needed for 3 pounds gain in one day or 5 pounds in one week      . gabapentin (NEURONTIN) 600 MG tablet Take one tablet by mouth twice daily.  60 tablet  3  . glucose blood (ACCU-CHEK AVIVA) test strip Use as instructed to check glucose 3 to 4 times daily to control blood sugar. 250.00  100 each  12  . HYDROcodone-acetaminophen (NORCO) 10-325 MG per tablet Take one tablet every three hours as needed for pain  240 tablet  3  . insulin aspart (NOVOLOG FLEXPEN) 100 UNIT/ML injection Inject 16 Units into the skin 3 (three) times daily before meals.  1 vial  6  . insulin glargine (LANTUS) 100 UNIT/ML injection Inject into the skin at bedtime. Inject 70 units at bedtime for diabe      . ipratropium-albuterol (DUONEB) 0.5-2.5 (3) MG/3ML SOLN Take 3 mLs by nebulization. Use every 6 hours as needed for wheezing and SOB      . lidocaine (LIDODERM) 5 % Place 1 patch onto the skin as needed.      . Nystatin (NYAMYC) 100000 UNIT/GM POWD Apply 1 g topically daily as needed (for itching).       . nystatin-triamcinolone (MYCOLOG II) cream Apply  topically 4 (four) times daily. Apply under abdomen folds twice daily until rash healed      . omega-3 acid ethyl esters (LOVAZA) 1 G capsule Take 1 capsule (1 g total) by mouth daily.  30 capsule  3  . oxybutynin (DITROPAN-XL) 5 MG 24 hr tablet Take 1 tablet (5 mg total) by mouth daily.  30 tablet  0  . oxyCODONE (OXY IR/ROXICODONE) 5 MG immediate release tablet Take one tablet every 6 hours  as needed for severe pain, Take 2 tablets at bedtime.  180 tablet  0  . pantoprazole (PROTONIX) 40 MG tablet Take 1 tablet (40 mg total) by mouth daily.  30 tablet  0  . phenazopyridine (PYRIDIUM) 95 MG tablet Take 1 tablet (95 mg total) by mouth 3 (three) times daily as needed for pain.  10 tablet  0  . polyvinyl alcohol (LIQUIFILM TEARS) 1.4 % ophthalmic solution Place 1 drop into both eyes as needed (for dry eyes).       . potassium chloride (MICRO-K) 10 MEQ CR capsule Take 1 capsule (10 mEq total) by mouth 2 (two) times daily.  60 capsule  3  . promethazine (PHENERGAN) 12.5 MG tablet Take 12.5 mg by mouth every 6 (six) hours as needed.       Marland Kitchen SYNTHROID 200 MCG tablet Take 1 tablet (200 mcg total) by mouth daily before breakfast.  30 tablet  0  . tiotropium (SPIRIVA) 18 MCG inhalation capsule Place 1 capsule (18 mcg total) into inhaler and inhale daily.  30 capsule  3  . triamterene-hydrochlorothiazide (MAXZIDE-25) 37.5-25 MG per tablet Take 1 tablet by mouth daily. Take one tablet once a day for blood pressure  90 tablet  3  . zolpidem (AMBIEN) 5 MG tablet Take 1 tablet (5 mg total) by mouth at bedtime as needed for sleep.  30 tablet  0   No current facility-administered medications on file prior to visit.    ROS:  Out of a complete 14 system review of symptoms, the patient complains only of the following symptoms, and all other reviewed systems are negative.  Fatigue Difficulty swallowing Shortness of breath, cough, wheezing Increased thirst, flushing Urinary incontinence Joint pain, joint  swelling, achy muscles Numbness, weakness Insomnia, decreased energy, change in appetite Restless legs  Blood pressure 147/88, pulse 113, weight 272 lb (123.378 kg).  Physical Exam  General: The patient is alert and cooperative at the time of the examination. The patient is markedly obese.  Skin: No significant peripheral edema is noted.   Neurologic Exam  Cranial nerves: Facial symmetry is present. Speech is normal, no aphasia or dysarthria is noted. Extraocular movements are full. Visual fields are full.  Motor: The patient has good strength in all 4 extremities.  Coordination: The patient has good finger-nose-finger and heel-to-shin bilaterally.  Gait and station: The patient is unable to arise from a seated position with the arms crossed. Once up, the patient is able to ambulate short distances, gait is slightly wide-based. Tandem gait was not attempted. Romberg is negative. No drift is seen.  Reflexes: Deep tendon reflexes are symmetric, but are depressed.   Assessment/Plan:  1. Rheumatoid arthritis  2. Degenerative arthritis  3. Obesity  4. Diabetes  5. Peripheral neuropathy  6. Low back pain, leg pain  7. Gait disorder  The patient indicates that she has had a decline in her ability to ambulate. The patient has unusual symptoms of pain of the spine and back with swallowing and eating. The patient will have similar pain with having a bowel movement. MRI evaluation of the low back does not explain her current symptoms. The patient will be set up for bilateral hip x-rays, and she will have MRI evaluation of the cervical and thoracic spine. The patient will followup in 3-4 months. The patient will have an epidural steroid injection, this has helped her pain some previously, but she cannot continue to have these injections on a regular basis.  Marlan Palau  MD 04/19/2013 7:06 PM  [facility] [address] [place], [place]  [address]  Phone [phone] Fax [phone]

## 2013-04-24 ENCOUNTER — Other Ambulatory Visit: Payer: Self-pay | Admitting: Internal Medicine

## 2013-04-25 ENCOUNTER — Ambulatory Visit
Admission: RE | Admit: 2013-04-25 | Discharge: 2013-04-25 | Disposition: A | Discharge status: Home / Self Care | Payer: Medicare Other | Source: Ambulatory Visit | Attending: Neurology | Admitting: Neurology

## 2013-04-25 ENCOUNTER — Other Ambulatory Visit: Payer: Medicare Other

## 2013-04-25 DIAGNOSIS — M47817 Spondylosis without myelopathy or radiculopathy, lumbosacral region: Secondary | ICD-10-CM | POA: Diagnosis not present

## 2013-04-25 DIAGNOSIS — M545 Low back pain, unspecified: Secondary | ICD-10-CM | POA: Diagnosis not present

## 2013-04-25 MED ORDER — IOHEXOL 180 MG/ML  SOLN
1.0000 mL | Freq: Once | INTRAMUSCULAR | Status: AC | PRN
  Administered 2013-04-25: 1 mL via EPIDURAL

## 2013-04-25 MED ORDER — METHYLPREDNISOLONE ACETATE 40 MG/ML INJ SUSP (RADIOLOG
120.0000 mg | Freq: Once | INTRAMUSCULAR | Status: AC
  Administered 2013-04-25: 120 mg via EPIDURAL

## 2013-04-30 ENCOUNTER — Other Ambulatory Visit: Payer: Self-pay | Admitting: Rheumatology

## 2013-05-01 ENCOUNTER — Ambulatory Visit
Admission: RE | Admit: 2013-05-01 | Discharge: 2013-05-01 | Disposition: A | Discharge status: Home / Self Care | Payer: Medicare Other | Source: Ambulatory Visit | Attending: Neurology | Admitting: Neurology

## 2013-05-01 ENCOUNTER — Telehealth: Payer: Self-pay | Admitting: Neurology

## 2013-05-01 DIAGNOSIS — Z13220 Encounter for screening for lipoid disorders: Secondary | ICD-10-CM | POA: Diagnosis not present

## 2013-05-01 DIAGNOSIS — G8929 Other chronic pain: Secondary | ICD-10-CM

## 2013-05-01 DIAGNOSIS — G609 Hereditary and idiopathic neuropathy, unspecified: Secondary | ICD-10-CM

## 2013-05-01 DIAGNOSIS — M1610 Unilateral primary osteoarthritis, unspecified hip: Secondary | ICD-10-CM | POA: Diagnosis not present

## 2013-05-01 DIAGNOSIS — R269 Unspecified abnormalities of gait and mobility: Secondary | ICD-10-CM

## 2013-05-01 DIAGNOSIS — Z79899 Other long term (current) drug therapy: Secondary | ICD-10-CM | POA: Diagnosis not present

## 2013-05-01 DIAGNOSIS — G544 Lumbosacral root disorders, not elsewhere classified: Secondary | ICD-10-CM

## 2013-05-01 DIAGNOSIS — M169 Osteoarthritis of hip, unspecified: Secondary | ICD-10-CM | POA: Diagnosis not present

## 2013-05-01 NOTE — Telephone Encounter (Signed)
I called patient. The x-rays of the hips show evidence of bilateral hip arthritis, right greater than left. The patient indicates that the discomfort is worse on the left side. The patient has been placed on any medication for  rheumatoid arthritis. MRI evaluation of the spine will be done in the near future. The epidural steroid injections of the low back do help some.

## 2013-05-02 ENCOUNTER — Ambulatory Visit
Admission: RE | Admit: 2013-05-02 | Discharge: 2013-05-02 | Disposition: A | Discharge status: Home / Self Care | Payer: Medicare Other | Source: Ambulatory Visit | Attending: Neurology | Admitting: Neurology

## 2013-05-02 ENCOUNTER — Other Ambulatory Visit: Payer: Self-pay | Admitting: *Deleted

## 2013-05-02 ENCOUNTER — Telehealth: Payer: Self-pay | Admitting: Neurology

## 2013-05-02 DIAGNOSIS — G8929 Other chronic pain: Secondary | ICD-10-CM

## 2013-05-02 DIAGNOSIS — G544 Lumbosacral root disorders, not elsewhere classified: Secondary | ICD-10-CM

## 2013-05-02 DIAGNOSIS — G609 Hereditary and idiopathic neuropathy, unspecified: Secondary | ICD-10-CM

## 2013-05-02 DIAGNOSIS — R269 Unspecified abnormalities of gait and mobility: Secondary | ICD-10-CM

## 2013-05-02 DIAGNOSIS — M545 Low back pain, unspecified: Secondary | ICD-10-CM

## 2013-05-02 MED ORDER — ALPRAZOLAM 0.5 MG PO TABS: ORAL_TABLET | ORAL | Status: DC

## 2013-05-02 MED ORDER — OXYCODONE HCL 5 MG PO TABS: ORAL_TABLET | ORAL | Status: DC

## 2013-05-02 NOTE — Telephone Encounter (Signed)
Patient is requesting Xanax for MRI.  She would like the meds called into [facility].  Thank you.

## 2013-05-02 NOTE — Telephone Encounter (Signed)
Will call in Xanax for the MRI study.

## 2013-05-23 ENCOUNTER — Ambulatory Visit
Admission: RE | Admit: 2013-05-23 | Discharge: 2013-05-23 | Disposition: A | Discharge status: Home / Self Care | Payer: Medicare Other | Source: Ambulatory Visit | Attending: Neurology | Admitting: Neurology

## 2013-05-23 DIAGNOSIS — R209 Unspecified disturbances of skin sensation: Secondary | ICD-10-CM | POA: Diagnosis not present

## 2013-05-23 DIAGNOSIS — R269 Unspecified abnormalities of gait and mobility: Secondary | ICD-10-CM

## 2013-05-26 ENCOUNTER — Other Ambulatory Visit: Payer: Self-pay | Admitting: Nurse Practitioner

## 2013-05-27 ENCOUNTER — Telehealth: Payer: Self-pay | Admitting: Neurology

## 2013-05-27 MED ORDER — GABAPENTIN 600 MG PO TABS: 600.0000 mg | ORAL_TABLET | Freq: Three times a day (TID) | ORAL | Status: DC

## 2013-05-27 NOTE — Telephone Encounter (Signed)
I called the patient. The MRI studies of the cervical and thoracic spines were relatively unremarkable. The patient does have degenerative changes at the L4-5 and L5-S1 levels on the lumbosacral MRI. The patient gains only about 3 weeks of benefit from the epidural steroid injections. The patient is on gabapentin, and she is tolerating it, we will go up on the dose taking 600 mg 3 times daily.

## 2013-05-28 ENCOUNTER — Other Ambulatory Visit: Payer: Self-pay | Admitting: *Deleted

## 2013-05-28 DIAGNOSIS — M069 Rheumatoid arthritis, unspecified: Secondary | ICD-10-CM

## 2013-05-28 DIAGNOSIS — G8929 Other chronic pain: Secondary | ICD-10-CM

## 2013-05-28 MED ORDER — HYDROCODONE-ACETAMINOPHEN 10-325 MG PO TABS: ORAL_TABLET | ORAL | Status: DC

## 2013-05-28 MED ORDER — OXYCODONE HCL 5 MG PO TABS: ORAL_TABLET | ORAL | Status: DC

## 2013-06-05 ENCOUNTER — Other Ambulatory Visit: Payer: Self-pay | Admitting: Internal Medicine

## 2013-06-13 ENCOUNTER — Ambulatory Visit (INDEPENDENT_AMBULATORY_CARE_PROVIDER_SITE_OTHER): Payer: Medicare Other | Admitting: Internal Medicine

## 2013-06-13 ENCOUNTER — Encounter: Payer: Self-pay | Admitting: Internal Medicine

## 2013-06-13 VITALS — BP 162/88 | HR 113 | Temp 98.2°F | Wt 260.0 lb

## 2013-06-13 DIAGNOSIS — K219 Gastro-esophageal reflux disease without esophagitis: Secondary | ICD-10-CM

## 2013-06-13 DIAGNOSIS — E039 Hypothyroidism, unspecified: Secondary | ICD-10-CM

## 2013-06-13 DIAGNOSIS — E1159 Type 2 diabetes mellitus with other circulatory complications: Secondary | ICD-10-CM

## 2013-06-13 DIAGNOSIS — G4700 Insomnia, unspecified: Secondary | ICD-10-CM | POA: Diagnosis not present

## 2013-06-13 DIAGNOSIS — R000 Tachycardia, unspecified: Secondary | ICD-10-CM

## 2013-06-13 DIAGNOSIS — M069 Rheumatoid arthritis, unspecified: Secondary | ICD-10-CM

## 2013-06-13 MED ORDER — BECLOMETHASONE DIPROPIONATE 80 MCG/ACT IN AERS: 1.0000 | INHALATION_SPRAY | Freq: Two times a day (BID) | RESPIRATORY_TRACT | Status: DC | PRN

## 2013-06-13 MED ORDER — LIDOCAINE 5 % EX PTCH: 1.0000 | MEDICATED_PATCH | CUTANEOUS | Status: DC | PRN

## 2013-06-13 MED ORDER — DULOXETINE HCL 30 MG PO CPEP: 30.0000 mg | ORAL_CAPSULE | Freq: Every day | ORAL | Status: DC

## 2013-06-13 MED ORDER — FUROSEMIDE 20 MG PO TABS: 20.0000 mg | ORAL_TABLET | Freq: Two times a day (BID) | ORAL | Status: DC | PRN

## 2013-06-13 MED ORDER — POTASSIUM CHLORIDE ER 10 MEQ PO CPCR: 10.0000 meq | ORAL_CAPSULE | Freq: Two times a day (BID) | ORAL | Status: DC

## 2013-06-13 MED ORDER — ZOLPIDEM TARTRATE 5 MG PO TABS: 5.0000 mg | ORAL_TABLET | Freq: Every evening | ORAL | Status: DC | PRN

## 2013-06-13 MED ORDER — LISINOPRIL 5 MG PO TABS: 5.0000 mg | ORAL_TABLET | Freq: Every day | ORAL | Status: DC

## 2013-06-13 MED ORDER — OMEGA-3-ACID ETHYL ESTERS 1 G PO CAPS: 1.0000 g | ORAL_CAPSULE | Freq: Every day | ORAL | Status: DC

## 2013-06-13 MED ORDER — PANTOPRAZOLE SODIUM 40 MG PO TBEC: 40.0000 mg | DELAYED_RELEASE_TABLET | Freq: Every day | ORAL | Status: DC

## 2013-06-13 MED ORDER — DULOXETINE HCL 60 MG PO CPEP: 60.0000 mg | ORAL_CAPSULE | Freq: Every day | ORAL | Status: DC

## 2013-06-13 NOTE — Patient Instructions (Signed)
Continue the protonix for 6 mos  Let's try cymbalta for your mood and pain control.  30mg  for one month, then go up to 60mg .    Resume Lisinopril 5mg  daily for blood pressure and continue to check it.  If it is remaining over 150/90 most of the time, let me know.    I don't think you should get more steroid injections for at least a few months due to your pulse and sugar readings.

## 2013-06-13 NOTE — Progress Notes (Signed)
Patient ID: Katherine Delgado, female   DOB: June 16, 1941, 72 y.o.   MRN: [id number] Location:  [facility] / [facility]   Allergies  Allergen Reactions  . Penicillins     Reaction not known  . Sulfamethoxazole-Trimethoprim     Reaction not known    Chief Complaint  Patient presents with  . Medical Managment of Chronic Issues    3 month follow-up, HTN-? restart Lisinopril   . Medication Management    discuss increasing dose of Ambien  . Medication Refill    Renew all pending medications, ? if patient to continue with protonix (generic)   . FYI    Patient with severl steriod injections that may be related to elevated A1c (glucose)     HPI: Patient is a 72 y.o. white female seen in the office today for medical mgt of chronic issues.  Her son brought her today.    BP has been out of control since last time.    Soreness and stiffness improved with steroid injections, but, of course, she's had hyperglycemia, itching, irritability when she first gets them.  Sugars are up and down on account of what she eats also.  175 she says with cream in coffee.  hba1c now 9.2.  Had been over 8 last time, also.  Discussed seeing Cathey.    Pulse also fast and staying that way.    Breathing:  Still has coughing and hoarseness some.  Using her spiriva, mucinex.    Has acid reflux at times--was put on coarse of protonix.  Helped, but did not go away.    At one time, she was on effexor.  Mood was pretty good then.  Now seems irritable.    Review of Systems:  Review of Systems  Constitutional: Positive for malaise/fatigue. Negative for fever and chills.  HENT: Negative for congestion and hearing loss.   Eyes: Negative for blurred vision.  Respiratory: Positive for cough, sputum production and shortness of breath. Negative for wheezing.   Cardiovascular: Negative for chest pain, palpitations, leg swelling and PND.       Tachycardia  Gastrointestinal: Positive for heartburn.  Negative for nausea, vomiting, abdominal pain, constipation, blood in stool and melena.  Genitourinary: Negative for dysuria.  Musculoskeletal: Positive for back pain, joint pain, myalgias and neck pain. Negative for falls.  Skin: Positive for itching. Negative for rash.  Neurological: Positive for sensory change. Negative for dizziness, focal weakness, loss of consciousness, weakness and headaches.  Endo/Heme/Allergies: Bruises/bleeds easily.  Psychiatric/Behavioral: Positive for depression. Negative for memory loss. The patient is nervous/anxious and has insomnia.     Past Medical History  Diagnosis Date  . COPD (chronic obstructive pulmonary disease)   . Hypertension   . RA (rheumatoid arthritis)   . Thyroid disease   . Miscarriage 1962  . History of benign thymus tumor   . Back injury   . GERD (gastroesophageal reflux disease)   . Arthritis   . Fibromyalgia   . Diabetes mellitus without complication   . Thyroid disorder   . Acute infective polyneuritis 2002  . Guillain-Barre syndrome   . Polyneuropathy in diabetes(357.2)   . Morbid obesity   . Spondylosis, lumbosacral   . Degenerative arthritis   . Dyslipidemia   . Spinal stenosis, lumbar region, without neurogenic claudication   . Candidiasis of skin and nails   . Acute maxillary sinusitis   . Insomnia, unspecified   . Candidiasis of vulva and vagina   . Edema   .  Acute sinusitis, unspecified   . Obstructive chronic bronchitis with acute bronchitis   . Urinary tract infection, site not specified   . Unspecified hypothyroidism   . Type II or unspecified type diabetes mellitus with peripheral circulatory disorders, uncontrolled(250.72)   . Mixed hyperlipidemia   . Other specified disease of white blood cells   . Depressive disorder, not elsewhere classified   . Chronic pain syndrome   . Tear film insufficiency, unspecified   . Unspecified essential hypertension   . Allergic rhinitis due to pollen   . Unspecified  chronic bronchitis   . Reflux esophagitis   . Diaphragmatic hernia without mention of obstruction or gangrene   . Unspecified pruritic disorder   . Rheumatoid arthritis(714.0)   . Pain in joint, multiple sites   . Stiffness of joints, not elsewhere classified, multiple sites   . Lumbago   . Other malaise and fatigue   . Spontaneous ecchymoses   . Abnormal weight gain   . Shortness of breath   . Cough   . Unspecified urinary incontinence     Past Surgical History  Procedure Laterality Date  . Abdominal hysterectomy  1974  . Tonsillectomy    . Cholecystectomy  1984  . Back surgery  1982  . Ovarian cyst surgery  1968  . Thymus tumor    . Knee surgery Bilateral 08/27/2010 (L) and 01/11/2011 (R)  . Miscarrage  1962  . Bronchoscopy  2001  . Abdominal tumor  2002  . Thymus tumor  10/2000    Social History:   reports that she quit smoking about 33 years ago. Her smoking use included Cigarettes. She smoked 0.00 packs per day. She does not have any smokeless tobacco history on file. She reports that she does not drink alcohol or use illicit drugs.  Family History  Problem Relation Age of Onset  . Alzheimer's disease Mother   . Heart disease Mother   . Heart disease Father   . Liver disease Father   . Cancer Sister     Medications: Patient's Medications  New Prescriptions   No medications on file  Previous Medications   ASPIRIN EC 81 MG TABLET    Take 81 mg by mouth daily as needed.    BECLOMETHASONE (QVAR) 80 MCG/ACT INHALER    Inhale 1 puff into the lungs 2 (two) times daily as needed. For wheezing   CHOLECALCIFEROL (VITAMIN D-3) 5000 UNITS TABS    Take by mouth daily. 1 by mouth daily   FOLIC ACID (FOLVITE) 400 MCG TABLET    Take 400 mcg by mouth 2 (two) times daily as needed.    FUROSEMIDE (LASIX) 20 MG TABLET    Take 20 mg by mouth 2 (two) times daily as needed. Use as needed for 3 pounds gain in one day or 5 pounds in one week   GABAPENTIN (NEURONTIN) 600 MG TABLET     Take 1 tablet (600 mg total) by mouth 3 (three) times daily.   GLUCOSE BLOOD (ACCU-CHEK AVIVA) TEST STRIP    Use as instructed to check glucose 3 to 4 times daily to control blood sugar. 250.00   HYDROCODONE-ACETAMINOPHEN (NORCO) 10-325 MG PER TABLET    Take one tablet every three hours as needed for pain   INSULIN ASPART (NOVOLOG FLEXPEN) 100 UNIT/ML INJECTION    Inject 16 Units into the skin 3 (three) times daily before meals.   IPRATROPIUM-ALBUTEROL (DUONEB) 0.5-2.5 (3) MG/3ML SOLN    Take 3 mLs by nebulization. Use every  6 hours as needed for wheezing and SOB   LANTUS 100 UNIT/ML INJECTION    INJECT 70 UNITS AT BEDTIME FOR DIABETES   LIDOCAINE (LIDODERM) 5 %    Place 1 patch onto the skin as needed.   NYSTATIN (NYAMYC) 100000 UNIT/GM POWD    Apply 1 g topically daily as needed (for itching).    NYSTATIN-TRIAMCINOLONE (MYCOLOG II) CREAM    Apply topically 4 (four) times daily. Apply under abdomen folds twice daily until rash healed   OMEGA-3 ACID ETHYL ESTERS (LOVAZA) 1 G CAPSULE    Take 1 capsule (1 g total) by mouth daily.   OXYCODONE (OXY IR/ROXICODONE) 5 MG IMMEDIATE RELEASE TABLET    Take one tablet every 6 hours as needed for severe pain, Take 2 tablets at bedtime.   PANTOPRAZOLE (PROTONIX) 40 MG TABLET    Take 1 tablet (40 mg total) by mouth daily.   POLYVINYL ALCOHOL (LIQUIFILM TEARS) 1.4 % OPHTHALMIC SOLUTION    Place 1 drop into both eyes as needed (for dry eyes).    POTASSIUM CHLORIDE (MICRO-K) 10 MEQ CR CAPSULE    Take 1 capsule (10 mEq total) by mouth 2 (two) times daily.   PROMETHAZINE (PHENERGAN) 12.5 MG TABLET    Take 12.5 mg by mouth every 6 (six) hours as needed.    SYNTHROID 200 MCG TABLET    TAKE 1 TABLET BY MOUTH EVERY MORNING ON AN EMPTY STOMACH SEPARTE FROM OTHER MEDS   TIOTROPIUM (SPIRIVA) 18 MCG INHALATION CAPSULE    Place 1 capsule (18 mcg total) into inhaler and inhale daily.   TRIAMTERENE-HYDROCHLOROTHIAZIDE (MAXZIDE-25) 37.5-25 MG PER TABLET    Take 1 tablet by mouth  daily. Take one tablet once a day for blood pressure   XELJANZ 5 MG TABS    Take 5 mg by mouth daily. For Rheumatoid Arthritis   ZOLPIDEM (AMBIEN) 5 MG TABLET    Take 1 tablet (5 mg total) by mouth at bedtime as needed for sleep.  Modified Medications   No medications on file  Discontinued Medications   ALPRAZOLAM (XANAX) 0.5 MG TABLET    2 tablets to be taken 45 minutes prior to MRI study, take a third tablet if needed.   HYDROCODONE-ACETAMINOPHEN (NORCO) 10-325 MG PER TABLET    Take 10-325 tablets by mouth as needed.   OXYBUTYNIN (DITROPAN-XL) 5 MG 24 HR TABLET    Take 1 tablet (5 mg total) by mouth daily.   PHENAZOPYRIDINE (PYRIDIUM) 95 MG TABLET    Take 1 tablet (95 mg total) by mouth 3 (three) times daily as needed for pain.   SYNTHROID 200 MCG TABLET    Take 1 tablet (200 mcg total) by mouth daily before breakfast.     Physical Exam: Filed Vitals:   06/13/13 1439  BP: 162/88  Pulse: 113  Temp: 98.2 F (36.8 C)  TempSrc: Oral  Weight: 260 lb (117.935 kg)  SpO2: 96%  Physical Exam  Constitutional: She is oriented to person, place, and time. She appears well-developed and well-nourished. No distress.  HENT:  Head: Normocephalic and atraumatic.  Eyes: EOM are normal. Pupils are equal, round, and reactive to light.  Cardiovascular: Normal rate, regular rhythm, normal heart sounds and intact distal pulses.   Pulmonary/Chest: Effort normal and breath sounds normal. No respiratory distress.  Abdominal: Soft. Bowel sounds are normal.  Musculoskeletal: She exhibits tenderness. She exhibits no edema.  Seated in wheelchair, has fibromyalgia tender points  Neurological: She is alert and oriented to person, place,  and time.  Skin: Skin is warm and dry.  Psychiatric: She has a normal mood and affect. Her behavior is normal. Judgment and thought content normal.    Labs reviewed: Basic Metabolic Panel:  Recent Labs  16/10/96 0843 04/01/13 1426 04/16/13 0818  NA 143 137 141  K 3.9  4.4 4.5  CL 99 96* 97  CO2 27 27 25   GLUCOSE 168* 252* 170*  BUN 17 17 14   CREATININE 0.98 0.80 1.01*  CALCIUM 10.0 9.4 9.6  TSH 2.480  --   --    Liver Function Tests:  Recent Labs  11/23/12 1316 04/01/13 1426  AST  --  16  ALT  --  22  ALKPHOS  --  120*  BILITOT  --  0.2  PROT 6.5 6.1  CBC:  Recent Labs  04/01/13 1426 04/16/13 0818  WBC 15.4* 14.8*  NEUTROABS 12.1* 10.7*  HGB 13.4 14.1  HCT 42.0 43.7  MCV 79 80  PLT 407*  --    Lipid Panel:  Recent Labs  12/04/12 0843 04/16/13 0818  HDL 33* 23*  LDLCALC 83 89  TRIG 191* 157*  CHOLHDL 4.7* 6.2*   Lab Results  Component Value Date   HGBA1C 9.2* 04/16/2013   Assessment/Plan 1. GERD (gastroesophageal reflux disease) - pantoprazole (PROTONIX) 40 MG tablet; Take 1 tablet (40 mg total) by mouth daily.  Dispense: 30 tablet; Refill: 5 -advised to continue this for 6 mos to see if this helps her cough which she already has now (so lisinopril was not cause) 2. Insomnia - zolpidem (AMBIEN) 5 MG tablet; Take 1 tablet (5 mg total) by mouth at bedtime as needed for sleep.  Dispense: 30 tablet; Refill: 1 -this was refilled today--pt is aware of the risks of taking this medication at her age 83. Unspecified hypothyroidism -continue synthroid at current dose 4. Tachycardia -heart rate elevated likely due to multiple steroid injections--advised she should avoid any of these for about 3 mos to prevent worsening tachycardia, hypertension, and hyperglycemia--says she decided this on her own anyway 5. Type II or unspecified type diabetes mellitus with peripheral circulatory disorders, uncontrolled(250.72) -hba1c well above goal -she said she will call to make an appointment with Cathey as I have recommended -insulin was not changed today b/c of steroids as cause  6. Rheumatoid arthritis(714.0) -is now on xeljanz and unsure if she notices a benefit, but continuing to see if she does get a benefit 7.  Lumbar spinal  stenosis -has been receiving steroid injections via Dr. Anne Hahn 8.  Hypertension, benign essential -resume lisinopril 5mg  daily as cough does not appear to be caused by this b/c she still has it right now (likely GERD and her chronic bronchitis) -she will continue to check it outside of the office and is to let me know if it is running over 150/90 consistently so dose can be adjusted appropriately  Labs/tests ordered:  Hba1c, cbc, bmp, flp before Next appt:  3 mos

## 2013-06-19 ENCOUNTER — Other Ambulatory Visit: Payer: Self-pay | Admitting: Internal Medicine

## 2013-06-20 ENCOUNTER — Telehealth: Payer: Self-pay | Admitting: *Deleted

## 2013-06-20 NOTE — Telephone Encounter (Signed)
Spoke to pt VIA phone regarding medication refill on  Phenergan.  She is having some N&V along with Diarrhea x 1 week, last OV was 06/13/13, advised that she should come in for an OV but refused stating "If I get any worse I will call back and schedule an appointment, cause I am feeling a little bit better."  Pt advised Phenergan should not have to be taken that often, she understood and RX for Phenergan (30 tablets 0 refills) sent to pharmacy.

## 2013-06-26 ENCOUNTER — Other Ambulatory Visit: Payer: Self-pay | Admitting: *Deleted

## 2013-06-26 ENCOUNTER — Other Ambulatory Visit: Payer: Self-pay | Admitting: Internal Medicine

## 2013-06-26 DIAGNOSIS — G8929 Other chronic pain: Secondary | ICD-10-CM

## 2013-06-26 DIAGNOSIS — M069 Rheumatoid arthritis, unspecified: Secondary | ICD-10-CM

## 2013-06-26 MED ORDER — OXYCODONE HCL 5 MG PO TABS: ORAL_TABLET | ORAL | Status: DC

## 2013-06-26 MED ORDER — HYDROCODONE-ACETAMINOPHEN 10-325 MG PO TABS: ORAL_TABLET | ORAL | Status: DC

## 2013-07-16 ENCOUNTER — Other Ambulatory Visit: Payer: Self-pay | Admitting: Nurse Practitioner

## 2013-07-16 ENCOUNTER — Other Ambulatory Visit: Payer: Self-pay | Admitting: Internal Medicine

## 2013-07-24 ENCOUNTER — Other Ambulatory Visit: Payer: Self-pay | Admitting: *Deleted

## 2013-07-24 DIAGNOSIS — G8929 Other chronic pain: Secondary | ICD-10-CM

## 2013-07-24 DIAGNOSIS — M069 Rheumatoid arthritis, unspecified: Secondary | ICD-10-CM

## 2013-07-24 MED ORDER — OXYCODONE HCL 5 MG PO TABS: ORAL_TABLET | ORAL | Status: DC

## 2013-07-24 MED ORDER — HYDROCODONE-ACETAMINOPHEN 10-325 MG PO TABS: ORAL_TABLET | ORAL | Status: DC

## 2013-07-26 ENCOUNTER — Other Ambulatory Visit: Payer: Self-pay | Admitting: Internal Medicine

## 2013-07-31 ENCOUNTER — Ambulatory Visit (INDEPENDENT_AMBULATORY_CARE_PROVIDER_SITE_OTHER): Payer: Medicare Other | Admitting: Internal Medicine

## 2013-07-31 ENCOUNTER — Encounter: Payer: Self-pay | Admitting: Internal Medicine

## 2013-07-31 VITALS — BP 166/84 | HR 102 | Temp 98.5°F | Resp 18 | Wt 290.4 lb

## 2013-07-31 DIAGNOSIS — R51 Headache: Secondary | ICD-10-CM | POA: Diagnosis not present

## 2013-07-31 DIAGNOSIS — R110 Nausea: Secondary | ICD-10-CM | POA: Diagnosis not present

## 2013-07-31 DIAGNOSIS — J329 Chronic sinusitis, unspecified: Secondary | ICD-10-CM | POA: Diagnosis not present

## 2013-07-31 DIAGNOSIS — R519 Headache, unspecified: Secondary | ICD-10-CM | POA: Insufficient documentation

## 2013-07-31 MED ORDER — AZITHROMYCIN 500 MG PO TABS: ORAL_TABLET | ORAL | Status: DC

## 2013-07-31 MED ORDER — HYDROMORPHONE HCL 3 MG RE SUPP: 3.0000 mg | Freq: Four times a day (QID) | RECTAL | Status: DC | PRN

## 2013-07-31 MED ORDER — PROMETHAZINE HCL 25 MG RE SUPP: 25.0000 mg | Freq: Four times a day (QID) | RECTAL | Status: DC | PRN

## 2013-07-31 NOTE — Patient Instructions (Addendum)
Use medication as directed.  Call if symptoms worsen or do not improve in the next few days.

## 2013-07-31 NOTE — Progress Notes (Signed)
Subjective:    Patient ID: Katherine Delgado, female    DOB: Jul 11, 1941, 72 y.o.   MRN: [id number]  Chief Complaint  Patient presents with  . Acute Visit    N&V, pain in teeth, chills, HA, head/sinus congestion, coughing x 3 days.  has been taking Musinex D & Alka seltzer Plus Cold with little relief  . Immunizations    declines flu vaccine    HPI Symptoms as listed. Has not ever had anything this painful prior to this illlnes. She does have a history of sinus infection. Has had chills, but denies fever. At onset of illness, the throat was sore. Denies nasal drainage.  BP shows elevated SBP.  Joint pains are a little worse since onset of her illness.   Using Harriette Ohara daily for her RA.  Current Outpatient Prescriptions on File Prior to Visit  Medication Sig Dispense Refill  . aspirin EC 81 MG tablet Take 81 mg by mouth daily as needed.       . beclomethasone (QVAR) 80 MCG/ACT inhaler Inhale 1 puff into the lungs 2 (two) times daily as needed. For wheezing  1 Inhaler  5  . Cholecalciferol (VITAMIN D-3) 5000 UNITS TABS Take by mouth daily. 1 by mouth daily      . DULoxetine (CYMBALTA) 30 MG capsule TAKE ONE CAPSULE BY MOUTH EVERY DAY  30 capsule  2  . DULoxetine (CYMBALTA) 60 MG capsule Take 1 capsule (60 mg total) by mouth daily.  30 capsule  3  . folic acid (FOLVITE) 400 MCG tablet Take 400 mcg by mouth 2 (two) times daily as needed.       . furosemide (LASIX) 20 MG tablet Take 1 tablet (20 mg total) by mouth 2 (two) times daily as needed. Use as needed for 3 pounds gain in one day or 5 pounds in one week  60 tablet  5  . gabapentin (NEURONTIN) 600 MG tablet Take 1 tablet (600 mg total) by mouth 3 (three) times daily.  90 tablet  5  . glucose blood (ACCU-CHEK AVIVA) test strip Use as instructed to check glucose 3 to 4 times daily to control blood sugar. 250.00  100 each  12  . HYDROcodone-acetaminophen (NORCO) 10-325 MG per tablet Take one tablet every three hours as needed for pain  240  tablet  0  . insulin aspart (NOVOLOG FLEXPEN) 100 UNIT/ML injection Inject 16 Units into the skin 3 (three) times daily before meals.  1 vial  6  . ipratropium-albuterol (DUONEB) 0.5-2.5 (3) MG/3ML SOLN Take 3 mLs by nebulization. Use every 6 hours as needed for wheezing and SOB      . LANTUS 100 UNIT/ML injection INJECT 70 UNITS UNDER THE SKIN EVERY NIGHT AT BEDTIME FOR DIABETES  30 mL  5  . lidocaine (LIDODERM) 5 % Place 1 patch onto the skin as needed.  30 patch  0  . lisinopril (PRINIVIL,ZESTRIL) 5 MG tablet Take 1 tablet (5 mg total) by mouth daily.  30 tablet  3  . Nystatin (NYAMYC) 100000 UNIT/GM POWD Apply 1 g topically daily as needed (for itching).       . nystatin-triamcinolone (MYCOLOG II) cream Apply topically 4 (four) times daily. Apply under abdomen folds twice daily until rash healed      . omega-3 acid ethyl esters (LOVAZA) 1 G capsule Take 1 capsule (1 g total) by mouth daily.  30 capsule  5  . oxyCODONE (OXY IR/ROXICODONE) 5 MG immediate release tablet Take one  tablet every 6 hours as needed for severe pain, Take 2 tablets at bedtime.  180 tablet  0  . pantoprazole (PROTONIX) 40 MG tablet Take 1 tablet (40 mg total) by mouth daily.  30 tablet  5  . polyvinyl alcohol (LIQUIFILM TEARS) 1.4 % ophthalmic solution Place 1 drop into both eyes as needed (for dry eyes).       . potassium chloride (MICRO-K) 10 MEQ CR capsule Take 1 capsule (10 mEq total) by mouth 2 (two) times daily.  60 capsule  5  . promethazine (PHENERGAN) 12.5 MG tablet TAKE 1 TABLET BY MOUTH AS NEEDED FOR SEVERE NAUSEA AS DIRECTED  30 tablet  0  . SYNTHROID 200 MCG tablet TAKE 1 TABLET BY MOUTH EVERY MORNING ON AN EMPTY STOMACH SEPARTE FROM OTHER MEDICATIONS  30 tablet  2  . tiotropium (SPIRIVA) 18 MCG inhalation capsule Place 1 capsule (18 mcg total) into inhaler and inhale daily.  30 capsule  3  . triamterene-hydrochlorothiazide (MAXZIDE-25) 37.5-25 MG per tablet TAKE 1 TABLET BY MOUTH ONCE DAILY TO CONTROL BLOOD  PRESSURE  30 tablet  5  . XELJANZ 5 MG TABS Take 5 mg by mouth daily. For Rheumatoid Arthritis      . zolpidem (AMBIEN) 5 MG tablet Take 1 tablet (5 mg total) by mouth at bedtime as needed for sleep.  30 tablet  3   No current facility-administered medications on file prior to visit.    Review of Systems  Constitutional: Positive for fatigue. Negative for fever and chills.  HENT: Positive for sinus pressure and sore throat. Negative for congestion, ear pain, hearing loss, mouth sores and rhinorrhea.   Eyes: Positive for pain (pain behind left eye and above it).  Respiratory: Positive for cough (sputum +) and shortness of breath. Negative for wheezing.   Cardiovascular: Negative for chest pain, palpitations and leg swelling.  Gastrointestinal: Positive for nausea. Negative for vomiting, abdominal pain, diarrhea, constipation and abdominal distention.  Endocrine: Negative for cold intolerance, heat intolerance, polydipsia, polyphagia and polyuria.  Genitourinary: Negative for dysuria, frequency and flank pain.  Musculoskeletal: Positive for arthralgias, back pain, myalgias and neck pain.  Skin: Negative.   Neurological: Positive for headaches.  Hematological: Negative.   Psychiatric/Behavioral: Positive for sleep disturbance. The patient is nervous/anxious.        Depressed       Objective:BP 166/84  Pulse 102  Temp(Src) 98.5 F (36.9 C) (Oral)  Resp 18  Wt 290 lb 6.4 oz (131.725 kg)  SpO2 96%    Physical Exam  Constitutional: She is oriented to person, place, and time. She appears well-developed and well-nourished.  HENT:  Head: Normocephalic.  Right Ear: External ear normal.  Left Ear: External ear normal.  Nose: Nose normal.  Mouth/Throat: Oropharynx is clear and moist. No oropharyngeal exudate.  Eyes: Conjunctivae and EOM are normal. Pupils are equal, round, and reactive to light.  Neck: No JVD present. No tracheal deviation present. No thyromegaly present.    Cardiovascular: Normal rate, regular rhythm, normal heart sounds and intact distal pulses.  Exam reveals no gallop and no friction rub.   No murmur heard. Pulmonary/Chest: No respiratory distress. She has no wheezes. She has no rales. She exhibits no tenderness.  Abdominal: Bowel sounds are normal. She exhibits no distension and no mass. There is no tenderness.  Musculoskeletal: Normal range of motion. She exhibits tenderness. She exhibits no edema.  Lymphadenopathy:    She has no cervical adenopathy.  Neurological: She is alert and  oriented to person, place, and time. No cranial nerve deficit. Coordination normal.  Tender in the left frontal and temple area.  Skin: No rash noted. No erythema. No pallor.  Psychiatric: She has a normal mood and affect. Her behavior is normal. Thought content normal.          Assessment & Plan:  Headache(784.0) - Plan: HYDROmorphone (DILAUDID) 3 MG suppository  Sinus infection - Plan: azithromycin (ZITHROMAX) 500 MG tablet  Nausea alone - Plan: promethazine (PHENERGAN) 25 MG suppository

## 2013-08-19 ENCOUNTER — Other Ambulatory Visit: Payer: Self-pay | Admitting: *Deleted

## 2013-08-19 DIAGNOSIS — M069 Rheumatoid arthritis, unspecified: Secondary | ICD-10-CM

## 2013-08-19 DIAGNOSIS — G8929 Other chronic pain: Secondary | ICD-10-CM

## 2013-08-19 MED ORDER — HYDROCODONE-ACETAMINOPHEN 10-325 MG PO TABS: ORAL_TABLET | ORAL | Status: DC

## 2013-08-26 ENCOUNTER — Other Ambulatory Visit: Payer: Self-pay | Admitting: *Deleted

## 2013-08-26 MED ORDER — OXYCODONE HCL 5 MG PO TABS: ORAL_TABLET | ORAL | Status: DC

## 2013-09-10 ENCOUNTER — Other Ambulatory Visit: Payer: Medicare Other

## 2013-09-10 DIAGNOSIS — Z79899 Other long term (current) drug therapy: Secondary | ICD-10-CM | POA: Diagnosis not present

## 2013-09-10 DIAGNOSIS — E1159 Type 2 diabetes mellitus with other circulatory complications: Secondary | ICD-10-CM | POA: Diagnosis not present

## 2013-09-11 LAB — LIPID PANEL
Chol/HDL Ratio: 5 ratio units — ABNORMAL HIGH (ref 0.0–4.4)
Cholesterol, Total: 160 mg/dL (ref 100–199)
HDL: 32 mg/dL — ABNORMAL LOW (ref >39)
LDL Calculated: 96 mg/dL (ref 0–99)
Triglycerides: 158 mg/dL — ABNORMAL HIGH (ref 0–149)
VLDL Cholesterol Cal: 32 mg/dL (ref 5–40)

## 2013-09-11 LAB — CBC WITH DIFFERENTIAL/PLATELET
Basophils Absolute: 0.1 10*3/uL (ref 0.0–0.2)
Basos: 1 %
Eos: 2 %
Eosinophils Absolute: 0.3 10*3/uL (ref 0.0–0.4)
HCT: 45.6 % (ref 34.0–46.6)
Hemoglobin: 14.4 g/dL (ref 11.1–15.9)
Immature Grans (Abs): 0 10*3/uL (ref 0.0–0.1)
Immature Granulocytes: 0 %
Lymphocytes Absolute: 2.2 10*3/uL (ref 0.7–3.1)
Lymphs: 16 %
MCH: 25.9 pg — ABNORMAL LOW (ref 26.6–33.0)
MCHC: 31.6 g/dL (ref 31.5–35.7)
MCV: 82 fL (ref 79–97)
Monocytes Absolute: 0.8 10*3/uL (ref 0.1–0.9)
Monocytes: 6 %
Neutrophils Absolute: 10.3 10*3/uL — ABNORMAL HIGH (ref 1.4–7.0)
Neutrophils Relative %: 75 %
RBC: 5.56 x10E6/uL — ABNORMAL HIGH (ref 3.77–5.28)
RDW: 14.9 % (ref 12.3–15.4)
WBC: 13.7 10*3/uL — ABNORMAL HIGH (ref 3.4–10.8)

## 2013-09-11 LAB — BASIC METABOLIC PANEL
BUN/Creatinine Ratio: 19 (ref 11–26)
BUN: 18 mg/dL (ref 8–27)
CO2: 26 mmol/L (ref 18–29)
Calcium: 9.6 mg/dL (ref 8.7–10.3)
Chloride: 96 mmol/L — ABNORMAL LOW (ref 97–108)
Creatinine, Ser: 0.93 mg/dL (ref 0.57–1.00)
GFR calc Af Amer: 71 mL/min/{1.73_m2} (ref >59)
GFR calc non Af Amer: 62 mL/min/{1.73_m2} (ref >59)
Glucose: 147 mg/dL — ABNORMAL HIGH (ref 65–99)
Potassium: 3.8 mmol/L (ref 3.5–5.2)
Sodium: 140 mmol/L (ref 134–144)

## 2013-09-11 LAB — HEMOGLOBIN A1C
Est. average glucose Bld gHb Est-mCnc: 263 mg/dL
Hgb A1c MFr Bld: 10.8 % — ABNORMAL HIGH (ref 4.8–5.6)

## 2013-09-12 ENCOUNTER — Encounter: Payer: Self-pay | Admitting: Internal Medicine

## 2013-09-12 ENCOUNTER — Ambulatory Visit (INDEPENDENT_AMBULATORY_CARE_PROVIDER_SITE_OTHER): Payer: Medicare Other | Admitting: Internal Medicine

## 2013-09-12 VITALS — BP 144/68 | HR 94 | Temp 97.8°F | Wt 279.0 lb

## 2013-09-12 DIAGNOSIS — E038 Other specified hypothyroidism: Secondary | ICD-10-CM

## 2013-09-12 DIAGNOSIS — Z1239 Encounter for other screening for malignant neoplasm of breast: Secondary | ICD-10-CM

## 2013-09-12 DIAGNOSIS — R948 Abnormal results of function studies of other organs and systems: Secondary | ICD-10-CM | POA: Diagnosis not present

## 2013-09-12 DIAGNOSIS — R937 Abnormal findings on diagnostic imaging of other parts of musculoskeletal system: Secondary | ICD-10-CM

## 2013-09-12 DIAGNOSIS — E119 Type 2 diabetes mellitus without complications: Secondary | ICD-10-CM | POA: Insufficient documentation

## 2013-09-12 DIAGNOSIS — M949 Disorder of cartilage, unspecified: Secondary | ICD-10-CM | POA: Diagnosis not present

## 2013-09-12 DIAGNOSIS — E1159 Type 2 diabetes mellitus with other circulatory complications: Secondary | ICD-10-CM

## 2013-09-12 DIAGNOSIS — M899 Disorder of bone, unspecified: Secondary | ICD-10-CM

## 2013-09-12 DIAGNOSIS — Z23 Encounter for immunization: Secondary | ICD-10-CM

## 2013-09-12 DIAGNOSIS — M797 Fibromyalgia: Secondary | ICD-10-CM | POA: Insufficient documentation

## 2013-09-12 DIAGNOSIS — R5382 Chronic fatigue, unspecified: Secondary | ICD-10-CM

## 2013-09-12 DIAGNOSIS — G9332 Myalgic encephalomyelitis/chronic fatigue syndrome: Secondary | ICD-10-CM | POA: Insufficient documentation

## 2013-09-12 DIAGNOSIS — M8580 Other specified disorders of bone density and structure, unspecified site: Secondary | ICD-10-CM | POA: Insufficient documentation

## 2013-09-12 DIAGNOSIS — E785 Hyperlipidemia, unspecified: Secondary | ICD-10-CM

## 2013-09-12 MED ORDER — TETANUS-DIPHTH-ACELL PERTUSSIS 5-2.5-18.5 LF-MCG/0.5 IM SUSP: 0.5000 mL | Freq: Once | INTRAMUSCULAR | Status: DC

## 2013-09-12 MED ORDER — INSULIN ASPART 100 UNIT/ML ~~LOC~~ SOLN: 10.0000 [IU] | Freq: Three times a day (TID) | SUBCUTANEOUS | Status: DC

## 2013-09-12 NOTE — Progress Notes (Signed)
Patient ID: Katherine Delgado, female   DOB: June 28, 1941, 73 y.o.   MRN: [id number]   Location:  [facility]   Allergies  Allergen Reactions  . Penicillins     Reaction not known  . Sulfamethoxazole-Trimethoprim     Reaction not known    Chief Complaint  Patient presents with  . Medical Managment of Chronic Issues    3 month f/u, discuss labs (printed)  . other    cracked a rib on RT side x 2 weeks, has gottne some better; having cough, chest congestion, SOB(due to rib pain), has been taking Mucinex D, alka Seltzer Plus cold with little relief  . other    will print Tdap, and Pneumo given at  age 89, need Dexa & Mammogram ordered @ [facility] (last one there)    HPI: Patient is a 73 y.o. white female seen in the office today for medical mgt of her multiple comorbid medical conditions.  She was seen about a month ago for sinusitis and is not fully recovered.  Did improve significantly.  Felt like she needed three more pills of abx..    Is up walking with cane today.  Can't stand for a long time so sometimes uses w/c at home when cooking in the kitchen.    Pain is terrible.  Cracked a rib.  Reached over to do something to hibiscus plant.  Has been having pain there on the right side sine 08/31/13.  Couldn't breathe well or move well.  Still hard to cough and clear her throat.  Using mucinex DM.  Also using alka seltzer daytime.  Has had to use more pain medication due to the rib.  Has been using a combination of the hydrocodone and the oxycodone.    Glucose readings have frequently been over 300.  Had back injections with steroids.  Also holidays didn't help.  Admits she is eating poorly.  Has not been eating breakfast until 10-11am so eating really 2 meals per day.  Did stop going to endocrinologist.    Review of Systems:  Review of Systems  Constitutional: Positive for malaise/fatigue. Negative for fever.  HENT: Positive for congestion and sore  throat.   Eyes: Negative for blurred vision.  Respiratory: Positive for cough and wheezing. Negative for shortness of breath.   Cardiovascular: Positive for leg swelling. Negative for chest pain.  Gastrointestinal: Negative for abdominal pain.  Genitourinary: Negative for dysuria.  Musculoskeletal: Positive for back pain, joint pain and myalgias. Negative for falls.  Skin: Negative for rash.  Neurological: Positive for weakness and headaches. Negative for dizziness.  Endo/Heme/Allergies: Bruises/bleeds easily.  Psychiatric/Behavioral: Positive for depression. The patient is nervous/anxious.     Past Medical History  Diagnosis Date  . COPD (chronic obstructive pulmonary disease)   . Hypertension   . RA (rheumatoid arthritis)   . Thyroid disease   . Miscarriage 1962  . History of benign thymus tumor   . Back injury   . GERD (gastroesophageal reflux disease)   . Arthritis   . Fibromyalgia   . Diabetes mellitus without complication   . Thyroid disorder   . Acute infective polyneuritis 2002  . Guillain-Barre syndrome   . Polyneuropathy in diabetes(357.2)   . Morbid obesity   . Spondylosis, lumbosacral   . Degenerative arthritis   . Dyslipidemia   . Spinal stenosis, lumbar region, without neurogenic claudication   . Candidiasis of skin and nails   . Acute maxillary sinusitis   .  Insomnia, unspecified   . Candidiasis of vulva and vagina   . Edema   . Acute sinusitis, unspecified   . Obstructive chronic bronchitis with acute bronchitis   . Urinary tract infection, site not specified   . Unspecified hypothyroidism   . Type II or unspecified type diabetes mellitus with peripheral circulatory disorders, uncontrolled(250.72)   . Mixed hyperlipidemia   . Other specified disease of white blood cells   . Depressive disorder, not elsewhere classified   . Chronic pain syndrome   . Tear film insufficiency, unspecified   . Unspecified essential hypertension   . Allergic rhinitis due  to pollen   . Unspecified chronic bronchitis   . Reflux esophagitis   . Diaphragmatic hernia without mention of obstruction or gangrene   . Unspecified pruritic disorder   . Rheumatoid arthritis(714.0)   . Pain in joint, multiple sites   . Stiffness of joints, not elsewhere classified, multiple sites   . Lumbago   . Other malaise and fatigue   . Spontaneous ecchymoses   . Abnormal weight gain   . Shortness of breath   . Cough   . Unspecified urinary incontinence     Past Surgical History  Procedure Laterality Date  . Abdominal hysterectomy  1974  . Tonsillectomy    . Cholecystectomy  1984  . Back surgery  1982  . Ovarian cyst surgery  1968  . Thymus tumor    . Knee surgery Bilateral 08/27/2010 (L) and 01/11/2011 (R)  . Miscarrage  1962  . Bronchoscopy  2001  . Abdominal tumor  2002  . Thymus tumor  10/2000    Social History:   reports that she quit smoking about 33 years ago. Her smoking use included Cigarettes. She smoked 0.00 packs per day. She does not have any smokeless tobacco history on file. She reports that she does not drink alcohol or use illicit drugs.  Family History  Problem Relation Age of Onset  . Alzheimer's disease Mother   . Heart disease Mother   . Heart disease Father   . Liver disease Father   . Cancer Sister     Medications: Patient's Medications  New Prescriptions   No medications on file  Previous Medications   ASPIRIN EC 81 MG TABLET    Take 81 mg by mouth daily as needed.    AZITHROMYCIN (ZITHROMAX) 500 MG TABLET    One daily for infection   BECLOMETHASONE (QVAR) 80 MCG/ACT INHALER    Inhale 1 puff into the lungs 2 (two) times daily as needed. For wheezing   CHOLECALCIFEROL (VITAMIN D-3) 5000 UNITS TABS    Take by mouth daily. 1 by mouth daily   DULOXETINE (CYMBALTA) 60 MG CAPSULE    Take 1 capsule (60 mg total) by mouth daily.   FOLIC ACID (FOLVITE) 400 MCG TABLET    Take 400 mcg by mouth 2 (two) times daily as needed.    FUROSEMIDE  (LASIX) 20 MG TABLET    Take 1 tablet (20 mg total) by mouth 2 (two) times daily as needed. Use as needed for 3 pounds gain in one day or 5 pounds in one week   GABAPENTIN (NEURONTIN) 600 MG TABLET    Take 1 tablet (600 mg total) by mouth 3 (three) times daily.   GLUCOSE BLOOD (ACCU-CHEK AVIVA) TEST STRIP    Use as instructed to check glucose 3 to 4 times daily to control blood sugar. 250.00   HYDROCODONE-ACETAMINOPHEN (NORCO) 10-325 MG PER TABLET  Take one tablet every three hours as needed for pain   INSULIN ASPART (NOVOLOG FLEXPEN) 100 UNIT/ML INJECTION    Inject 16 Units into the skin 3 (three) times daily before meals.   IPRATROPIUM-ALBUTEROL (DUONEB) 0.5-2.5 (3) MG/3ML SOLN    Take 3 mLs by nebulization. Use every 6 hours as needed for wheezing and SOB   LANTUS 100 UNIT/ML INJECTION    INJECT 70 UNITS UNDER THE SKIN EVERY NIGHT AT BEDTIME FOR DIABETES   LIDOCAINE (LIDODERM) 5 %    Place 1 patch onto the skin as needed.   LISINOPRIL (PRINIVIL,ZESTRIL) 5 MG TABLET    Take 1 tablet (5 mg total) by mouth daily.   NYSTATIN (NYAMYC) 100000 UNIT/GM POWD    Apply 1 g topically daily as needed (for itching).    NYSTATIN-TRIAMCINOLONE (MYCOLOG II) CREAM    Apply topically 4 (four) times daily. Apply under abdomen folds twice daily until rash healed   OMEGA-3 ACID ETHYL ESTERS (LOVAZA) 1 G CAPSULE    Take 1 capsule (1 g total) by mouth daily.   OXYCODONE (OXY IR/ROXICODONE) 5 MG IMMEDIATE RELEASE TABLET    Take one tablet every 6 hours as needed for severe pain, Take 2 tablets at bedtime.   PANTOPRAZOLE (PROTONIX) 40 MG TABLET    Take 1 tablet (40 mg total) by mouth daily.   POLYVINYL ALCOHOL (LIQUIFILM TEARS) 1.4 % OPHTHALMIC SOLUTION    Place 1 drop into both eyes as needed (for dry eyes).    POTASSIUM CHLORIDE (MICRO-K) 10 MEQ CR CAPSULE    Take 1 capsule (10 mEq total) by mouth 2 (two) times daily.   PROMETHAZINE (PHENERGAN) 12.5 MG TABLET    TAKE 1 TABLET BY MOUTH AS NEEDED FOR SEVERE NAUSEA AS  DIRECTED   SYNTHROID 200 MCG TABLET    TAKE 1 TABLET BY MOUTH EVERY MORNING ON AN EMPTY STOMACH SEPARTE FROM OTHER MEDICATIONS   TDAP (BOOSTRIX) 5-2.5-18.5 LF-MCG/0.5 INJECTION    Inject 0.5 mLs into the muscle once.   TIOTROPIUM (SPIRIVA) 18 MCG INHALATION CAPSULE    Place 1 capsule (18 mcg total) into inhaler and inhale daily.   TRIAMTERENE-HYDROCHLOROTHIAZIDE (MAXZIDE-25) 37.5-25 MG PER TABLET    TAKE 1 TABLET BY MOUTH ONCE DAILY TO CONTROL BLOOD PRESSURE   XELJANZ 5 MG TABS    Take 5 mg by mouth daily. For Rheumatoid Arthritis   ZOLPIDEM (AMBIEN) 5 MG TABLET    Take 1 tablet (5 mg total) by mouth at bedtime as needed for sleep.  Modified Medications   No medications on file  Discontinued Medications   DULOXETINE (CYMBALTA) 30 MG CAPSULE    TAKE ONE CAPSULE BY MOUTH EVERY DAY   HYDROMORPHONE (DILAUDID) 3 MG SUPPOSITORY    Place 1 suppository (3 mg total) rectally every 6 (six) hours as needed for severe pain.   PROMETHAZINE (PHENERGAN) 25 MG SUPPOSITORY    Place 1 suppository (25 mg total) rectally every 6 (six) hours as needed for nausea or vomiting.   Physical Exam: Filed Vitals:   09/12/13 1442  BP: 144/68  Pulse: 94  Temp: 97.8 F (36.6 C)  TempSrc: Oral  Weight: 279 lb (126.554 kg)  SpO2: 97%  Physical Exam  Constitutional: She is oriented to person, place, and time. She appears well-developed and well-nourished. No distress.  Cardiovascular: Normal rate, regular rhythm, normal heart sounds and intact distal pulses.   Pulmonary/Chest: Effort normal and breath sounds normal. She has no wheezes.  Abdominal: Soft. Bowel sounds are normal. She  exhibits no distension and no mass. There is no tenderness.  Musculoskeletal:  Walks slowly with a cane  Neurological: She is alert and oriented to person, place, and time.  Skin: Skin is warm and dry.  Psychiatric: She has a normal mood and affect.    Labs reviewed: Basic Metabolic Panel:  Recent Labs  12/04/12 0843 04/01/13 1426  04/16/13 0818 09/10/13 0844  NA 143 137 141 140  K 3.9 4.4 4.5 3.8  CL 99 96* 97 96*  CO2 27 27 25 26   GLUCOSE 168* 252* 170* 147*  BUN 17 17 14 18   CREATININE 0.98 0.80 1.01* 0.93  CALCIUM 10.0 9.4 9.6 9.6  TSH 2.480  --   --   --    Liver Function Tests:  Recent Labs  11/23/12 1316 04/01/13 1426  AST  --  16  ALT  --  22  ALKPHOS  --  120*  BILITOT  --  0.2  PROT 6.5 6.1  CBC:  Recent Labs  04/01/13 1426 04/16/13 0818 09/10/13 0844  WBC 15.4* 14.8* 13.7*  NEUTROABS 12.1* 10.7* 10.3*  HGB 13.4 14.1 14.4  HCT 42.0 43.7 45.6  MCV 79 80 82  PLT 407*  --   --    Lipid Panel:  Recent Labs  12/04/12 0843 04/16/13 0818 09/10/13 0844  HDL 33* 23* 32*  LDLCALC 83 89 96  TRIG 191* 157* 158*  CHOLHDL 4.7* 6.2* 5.0*   Lab Results  Component Value Date   HGBA1C 10.8* 09/10/2013   Assessment/Plan 1. Chronic fatigue fibromyalgia syndrome - stable, unchanged, cont cymbalta, gabapentin -encouraged exercise, but she does not do much of this due to her back, hip and arthritis pains - CBC with Differential; Future - Comprehensive metabolic panel; Future  2. Abnormal bone density screening - needs f/u especially in view of frequent steroid use for asthma, postmenopausal state -cont vitamin D - DG Bone Density; Future  3. Breast cancer screening - due for mammogram... - MM Digital Screening; Future - CBC with Differential; Future  4. Osteopenia, senile - bone density ordered - CBC with Differential; Future  5. Type II or unspecified type diabetes mellitus with peripheral circulatory disorders, uncontrolled(250.72) - f/u labs, glucose has been elevated recently with latest sinus infection -cont lantus with novolog meal coverage - CBC with Differential; Future - Comprehensive metabolic panel; Future - Hemoglobin A1c; Future  6. Need for prophylactic vaccination with combined diphtheria-tetanus-pertussis (DTP) vaccine -prescription given to obtain at her  pharmacy  7. DIABETES MELLITUS -as in 5  8. Hypothyroidism - cont synthroid, f/u labs - TSH; Future  9. Hyperlipidemia LDL goal < 100 -stopped statin therapy due to myopathy - Lipid panel; Future  Labs/tests ordered:   Orders Placed This Encounter  Procedures  . DG Bone Density    [facility]    Standing Status: Future     Number of Occurrences:      Standing Expiration Date: 11/11/2014    Order Specific Question:  Reason for Exam (SYMPTOM  OR DIAGNOSIS REQUIRED)    Answer:  frequent steroid use, postmenopausal    Order Specific Question:  Preferred imaging location?    Answer:  External  . MM Digital Screening    [facility]    Standing Status: Future     Number of Occurrences:      Standing Expiration Date: 11/12/2014    Order Specific Question:  Reason for exam:    Answer:  routine screening  Order Specific Question:  Preferred imaging location?    Answer:  External  . CBC with Differential    Standing Status: Future     Number of Occurrences: 1     Standing Expiration Date: 03/12/2014  . Comprehensive metabolic panel    Standing Status: Future     Number of Occurrences: 1     Standing Expiration Date: 03/12/2014  . Hemoglobin A1c    Standing Status: Future     Number of Occurrences: 1     Standing Expiration Date: 03/12/2014  . Lipid panel    Standing Status: Future     Number of Occurrences: 1     Standing Expiration Date: 03/12/2014  . TSH    Standing Status: Future     Number of Occurrences: 1     Standing Expiration Date: 03/12/2014    Next appt:  3 mos;  Recommended she see Tivis Ringer here for her diabetes mgt also and bring her glucometer as she stopped seeing endocrine

## 2013-09-12 NOTE — Patient Instructions (Signed)
Use your nebulizer and warm humidity for your congestion.  Please come to see Katherine Delgado about your sugar.  She can help you with diet and adjusting your medications.  For now, increase your novolog to 10 units each meal.

## 2013-09-16 ENCOUNTER — Ambulatory Visit: Payer: Medicare Other | Admitting: Pharmacotherapy

## 2013-09-16 DIAGNOSIS — Z780 Asymptomatic menopausal state: Secondary | ICD-10-CM | POA: Diagnosis not present

## 2013-09-16 DIAGNOSIS — Z1231 Encounter for screening mammogram for malignant neoplasm of breast: Secondary | ICD-10-CM | POA: Diagnosis not present

## 2013-09-16 LAB — HM DEXA SCAN

## 2013-09-16 LAB — HM MAMMOGRAPHY

## 2013-09-18 ENCOUNTER — Other Ambulatory Visit: Payer: Self-pay | Admitting: *Deleted

## 2013-09-19 ENCOUNTER — Encounter: Payer: Self-pay | Admitting: *Deleted

## 2013-09-20 ENCOUNTER — Other Ambulatory Visit: Payer: Self-pay | Admitting: *Deleted

## 2013-09-20 DIAGNOSIS — M545 Other chronic pain: Secondary | ICD-10-CM

## 2013-09-20 DIAGNOSIS — M069 Rheumatoid arthritis, unspecified: Secondary | ICD-10-CM

## 2013-09-20 DIAGNOSIS — G8929 Other chronic pain: Secondary | ICD-10-CM

## 2013-09-20 MED ORDER — HYDROCODONE-ACETAMINOPHEN 10-325 MG PO TABS: ORAL_TABLET | ORAL | Status: DC

## 2013-09-23 ENCOUNTER — Encounter: Payer: Self-pay | Admitting: Pharmacotherapy

## 2013-09-23 ENCOUNTER — Ambulatory Visit (INDEPENDENT_AMBULATORY_CARE_PROVIDER_SITE_OTHER): Payer: Medicare Other | Admitting: Pharmacotherapy

## 2013-09-23 VITALS — BP 142/70 | HR 100 | Resp 10 | Wt 286.0 lb

## 2013-09-23 DIAGNOSIS — I10 Essential (primary) hypertension: Secondary | ICD-10-CM | POA: Diagnosis not present

## 2013-09-23 DIAGNOSIS — E119 Type 2 diabetes mellitus without complications: Secondary | ICD-10-CM

## 2013-09-23 DIAGNOSIS — E1159 Type 2 diabetes mellitus with other circulatory complications: Secondary | ICD-10-CM

## 2013-09-23 DIAGNOSIS — IMO0001 Reserved for inherently not codable concepts without codable children: Secondary | ICD-10-CM | POA: Diagnosis not present

## 2013-09-23 DIAGNOSIS — M1710 Unilateral primary osteoarthritis, unspecified knee: Secondary | ICD-10-CM | POA: Diagnosis not present

## 2013-09-23 DIAGNOSIS — M3500 Sicca syndrome, unspecified: Secondary | ICD-10-CM | POA: Diagnosis not present

## 2013-09-23 DIAGNOSIS — M069 Rheumatoid arthritis, unspecified: Secondary | ICD-10-CM | POA: Diagnosis not present

## 2013-09-23 MED ORDER — INSULIN ASPART 100 UNIT/ML ~~LOC~~ SOLN: 16.0000 [IU] | Freq: Three times a day (TID) | SUBCUTANEOUS | Status: DC

## 2013-09-23 MED ORDER — EXENATIDE ER 2 MG ~~LOC~~ PEN: 2.0000 mg | PEN_INJECTOR | SUBCUTANEOUS | Status: DC

## 2013-09-23 NOTE — Progress Notes (Signed)
Subjective:    Katherine Delgado is a 73 y.o.white female who presents for follow-up of Type 2 diabetes mellitus.   Her A1C is 10.8% She is currently on Lantus 70 units at beditme and Novolog 16 units with each meal. Has had DM for at least 15 years. She has been on insulin for at least 10 years.  Not following any special diets.  Often skips breakfast. No exercise.  Limited due to pain from RA.  She does have peripheral neuropathy in feet - left worse than right.  On Gabapentin. Some blurry vision.  Has been that way since her steroid injections in October 2014.  She also has a cataract.  Due for eye exam. Nocturia every night. In the last year she has had 2 UTI.  Not really troubled with vaginal yeast infections.  She does get yeast rashes in skin folds.  Average BG:  273mg /dl No hypoglycemia  She has been on Actos and metformin in the past. She stopped her Crestor due to myalgias.   Review of Systems A comprehensive review of systems was negative except for: Eyes: positive for cataracts Cardiovascular: positive for peripheral edema Neurological: positive for gait problems Endocrine: positive for diabetic symptoms including blurry vision and nocturia    Objective:    BP 142/70  Pulse 100  Resp 10  Wt 286 lb (129.729 kg)  SpO2 95%  General:  alert, cooperative, no distress and morbidly obese  Oropharynx: normal findings: lips normal without lesions and gums healthy   Eyes:  negative findings: lids and lashes normal and corneas clear   Ears:  external ears normal        Lung: clear to auscultation bilaterally  Heart:  regular rate and rhythm     Extremities: edema bilateral lower extremities  Skin: warm and dry, no hyperpigmentation, vitiligo, or suspicious lesions     Neuro: mental status, speech normal, alert and oriented x3 and requires a cane for ambulation   Lab Review Glucose (mg/dL)  Date Value  09/10/2013 147*  04/16/2013 170*  04/01/2013 252*      Glucose, Bld (mg/dL)  Date Value  10/20/2011 180*     CO2 (mmol/L)  Date Value  09/10/2013 26   04/16/2013 25   04/01/2013 27      BUN (mg/dL)  Date Value  09/10/2013 18   04/16/2013 14   04/01/2013 17   10/20/2011 28*     Creatinine, Ser (mg/dL)  Date Value  09/10/2013 0.93   04/16/2013 1.01*  04/01/2013 0.80     Assessment:    Diabetes Mellitus type II, under poor control.  BP above goal <140/90   Plan:    1.  Rx changes: Start Bydureon 2mg  once a week.  Counseled on risk / benefit including black box warning and risk of pancreatitis.  Taught patient how to use pen device.  First dose given in office.  GLP-1 agent chosen for ability to decreased BG, promote satiety, and potential weight loss benefit. 2.  Continue Lantus 70 units daily. 3.  Continue Novolog 16 units with meals. 4.  Counseled on insulin resistance. 5.  Counseled on benefit of routine exercise.  Goal is 30-45 minutes 5 x week. 6.  Counseled on meal planning and serving size.  Needs to stop skipping meals, then binging on snacks. 7.  BP slightly above target <140/90.  Continue lisinopril and furosemide and monitor. 8.  Will follow up with 6 weeks.  At that time will also consider addition  of SGLT-2 inhibitor to help with glucose lowering, BP reduction, and weight loss.

## 2013-09-23 NOTE — Patient Instructions (Signed)
Start Bydureon 2mg  once a week.

## 2013-09-26 ENCOUNTER — Other Ambulatory Visit: Payer: Self-pay | Admitting: *Deleted

## 2013-09-26 MED ORDER — OXYCODONE HCL 5 MG PO TABS: ORAL_TABLET | ORAL | Status: DC

## 2013-10-01 ENCOUNTER — Other Ambulatory Visit: Payer: Self-pay | Admitting: Internal Medicine

## 2013-10-04 ENCOUNTER — Other Ambulatory Visit: Payer: Self-pay | Admitting: *Deleted

## 2013-10-04 ENCOUNTER — Telehealth: Payer: Self-pay | Admitting: *Deleted

## 2013-10-04 MED ORDER — TRAZODONE HCL 50 MG PO TABS: 25.0000 mg | ORAL_TABLET | Freq: Every evening | ORAL | Status: DC | PRN

## 2013-10-04 MED ORDER — INSULIN PEN NEEDLE 31G X 5 MM MISC: Status: DC

## 2013-10-04 NOTE — Telephone Encounter (Signed)
Patient called and stated that the insurance is going up on her Ambien and told her if she switched to Trazodone HCL then it would only be like $10. Patient would like to know if she can switch? Please Advise

## 2013-10-04 NOTE — Telephone Encounter (Signed)
rx for Ambien d/c'ed and Trazadone sent to the pharmacy. Pt notified [place] phone.

## 2013-10-04 NOTE — Telephone Encounter (Signed)
We can try trazodone instead.  D/c ambien.  Trazodone 50 mg po qhs prn insomnia #30

## 2013-10-21 ENCOUNTER — Other Ambulatory Visit: Payer: Self-pay | Admitting: *Deleted

## 2013-10-21 DIAGNOSIS — M069 Rheumatoid arthritis, unspecified: Secondary | ICD-10-CM

## 2013-10-21 DIAGNOSIS — G8929 Other chronic pain: Secondary | ICD-10-CM

## 2013-10-21 DIAGNOSIS — M545 Low back pain: Secondary | ICD-10-CM

## 2013-10-21 MED ORDER — OXYCODONE HCL 5 MG PO TABS
ORAL_TABLET | ORAL | Status: DC
Start: 2013-10-21 — End: 2013-11-20

## 2013-10-21 MED ORDER — HYDROCODONE-ACETAMINOPHEN 10-325 MG PO TABS: ORAL_TABLET | ORAL | Status: DC

## 2013-10-30 ENCOUNTER — Other Ambulatory Visit: Payer: Self-pay | Admitting: Internal Medicine

## 2013-11-04 ENCOUNTER — Ambulatory Visit: Payer: Medicare Other | Admitting: Pharmacotherapy

## 2013-11-07 ENCOUNTER — Ambulatory Visit (INDEPENDENT_AMBULATORY_CARE_PROVIDER_SITE_OTHER): Payer: Medicare Other | Admitting: Neurology

## 2013-11-07 ENCOUNTER — Ambulatory Visit: Payer: Medicare Other | Admitting: Neurology

## 2013-11-07 ENCOUNTER — Encounter: Payer: Self-pay | Admitting: Neurology

## 2013-11-07 ENCOUNTER — Encounter (INDEPENDENT_AMBULATORY_CARE_PROVIDER_SITE_OTHER): Payer: Self-pay

## 2013-11-07 VITALS — BP 185/83 | HR 111 | Wt 278.0 lb

## 2013-11-07 DIAGNOSIS — IMO0001 Reserved for inherently not codable concepts without codable children: Secondary | ICD-10-CM | POA: Diagnosis not present

## 2013-11-07 DIAGNOSIS — M545 Low back pain, unspecified: Secondary | ICD-10-CM

## 2013-11-07 DIAGNOSIS — G8929 Other chronic pain: Secondary | ICD-10-CM | POA: Diagnosis not present

## 2013-11-07 DIAGNOSIS — G63 Polyneuropathy in diseases classified elsewhere: Secondary | ICD-10-CM | POA: Diagnosis not present

## 2013-11-07 MED ORDER — DULOXETINE HCL 60 MG PO CPEP: 60.0000 mg | ORAL_CAPSULE | Freq: Two times a day (BID) | ORAL | Status: DC

## 2013-11-07 NOTE — Patient Instructions (Signed)
Back Pain, Adult Low back pain is very common. About 1 in 5 people have back pain.The cause of low back pain is rarely dangerous. The pain often gets better over time.About half of people with a sudden onset of back pain feel better in just 2 weeks. About 8 in 10 people feel better by 6 weeks.  CAUSES Some common causes of back pain include:  Strain of the muscles or ligaments supporting the spine.  Wear and tear (degeneration) of the spinal discs.  Arthritis.  Direct injury to the back. DIAGNOSIS Most of the time, the direct cause of low back pain is not known.However, back pain can be treated effectively even when the exact cause of the pain is unknown.Answering your caregiver's questions about your overall health and symptoms is one of the most accurate ways to make sure the cause of your pain is not dangerous. If your caregiver needs more information, he or she may order lab work or imaging tests (X-rays or MRIs).However, even if imaging tests show changes in your back, this usually does not require surgery. HOME CARE INSTRUCTIONS For many people, back pain returns.Since low back pain is rarely dangerous, it is often a condition that people can learn to manageon their own.   Remain active. It is stressful on the back to sit or stand in one place. Do not sit, drive, or stand in one place for more than 30 minutes at a time. Take short walks on level surfaces as soon as pain allows.Try to increase the length of time you walk each day.  Do not stay in bed.Resting more than 1 or 2 days can delay your recovery.  Do not avoid exercise or work.Your body is made to move.It is not dangerous to be active, even though your back may hurt.Your back will likely heal faster if you return to being active before your pain is gone.  Pay attention to your body when you bend and lift. Many people have less discomfortwhen lifting if they bend their knees, keep the load close to their bodies,and  avoid twisting. Often, the most comfortable positions are those that put less stress on your recovering back.  Find a comfortable position to sleep. Use a firm mattress and lie on your side with your knees slightly bent. If you lie on your back, put a pillow under your knees.  Only take over-the-counter or prescription medicines as directed by your caregiver. Over-the-counter medicines to reduce pain and inflammation are often the most helpful.Your caregiver may prescribe muscle relaxant drugs.These medicines help dull your pain so you can more quickly return to your normal activities and healthy exercise.  Put ice on the injured area.  Put ice in a plastic bag.  Place a towel between your skin and the bag.  Leave the ice on for 15-20 minutes, 03-04 times a day for the first 2 to 3 days. After that, ice and heat may be alternated to reduce pain and spasms.  Ask your caregiver about trying back exercises and gentle massage. This may be of some benefit.  Avoid feeling anxious or stressed.Stress increases muscle tension and can worsen back pain.It is important to recognize when you are anxious or stressed and learn ways to manage it.Exercise is a great option. SEEK MEDICAL CARE IF:  You have pain that is not relieved with rest or medicine.  You have pain that does not improve in 1 week.  You have new symptoms.  You are generally not feeling well. SEEK   IMMEDIATE MEDICAL CARE IF:   You have pain that radiates from your back into your legs.  You develop new bowel or bladder control problems.  You have unusual weakness or numbness in your arms or legs.  You develop nausea or vomiting.  You develop abdominal pain.  You feel faint. Document Released: 08/08/2005 Document Revised: 02/07/2012 Document Reviewed: 12/27/2010 [employer] Patient Information 2014 [employer].  

## 2013-11-07 NOTE — Progress Notes (Signed)
Reason for visit: Peripheral neuropathy  Katherine Delgado is an 73 y.o. female  History of present illness:  Katherine Delgado is a 73 year old right-handed white female with a history of a peripheral neuropathy. The patient has diabetes and Sjogren syndrome which may cause peripheral neuropathies. The patient has had a lot of low back pain, and degenerative arthritis associated with the hips and knees. The patient has pain in the low back and hips with standing. The patient has undergone MRI evaluation of the cervical spine and thoracic spine when she was seen through this office last as she was reporting some heaviness of the arms, and pain in the neck down the spine and into the low back with eating and chewing. The patient has pain in the back with having a bowel movement. The patient has been on gabapentin taking 600 mg 3 times a day with some benefit with the neuropathy discomfort. The patient is on Cymbalta taking 60 mg daily. The patient reports no falls since last seen. Inside the house, the patient oftentimes uses a wheelchair for mobility as she is unable to stand for prolonged periods of time secondary to pain. The patient has chronic issues with insomnia. The patient returns for an evaluation.  Past Medical History  Diagnosis Date  . COPD (chronic obstructive pulmonary disease)   . Hypertension   . RA (rheumatoid arthritis)   . Thyroid disease   . Miscarriage 1962  . History of benign thymus tumor   . Back injury   . GERD (gastroesophageal reflux disease)   . Arthritis   . Fibromyalgia   . Diabetes mellitus without complication   . Thyroid disorder   . Acute infective polyneuritis 2002  . Guillain-Barre syndrome   . Polyneuropathy in diabetes(357.2)   . Morbid obesity   . Spondylosis, lumbosacral   . Degenerative arthritis   . Dyslipidemia   . Spinal stenosis, lumbar region, without neurogenic claudication   . Candidiasis of skin and nails   . Acute maxillary sinusitis   .  Insomnia, unspecified   . Candidiasis of vulva and vagina   . Edema   . Acute sinusitis, unspecified   . Obstructive chronic bronchitis with acute bronchitis   . Urinary tract infection, site not specified   . Unspecified hypothyroidism   . Type II or unspecified type diabetes mellitus with peripheral circulatory disorders, uncontrolled(250.72)   . Mixed hyperlipidemia   . Other specified disease of white blood cells   . Depressive disorder, not elsewhere classified   . Chronic pain syndrome   . Tear film insufficiency, unspecified   . Unspecified essential hypertension   . Allergic rhinitis due to pollen   . Unspecified chronic bronchitis   . Reflux esophagitis   . Diaphragmatic hernia without mention of obstruction or gangrene   . Unspecified pruritic disorder   . Rheumatoid arthritis(714.0)   . Pain in joint, multiple sites   . Stiffness of joints, not elsewhere classified, multiple sites   . Lumbago   . Other malaise and fatigue   . Spontaneous ecchymoses   . Abnormal weight gain   . Shortness of breath   . Cough   . Unspecified urinary incontinence   . Osteopenia, senile     Past Surgical History  Procedure Laterality Date  . Abdominal hysterectomy  1974  . Tonsillectomy    . Cholecystectomy  1984  . Back surgery  1982  . Ovarian cyst surgery  1968  . Thymus tumor    .  Knee surgery Bilateral 08/27/2010 (L) and 01/11/2011 (R)  . [place]  . Bronchoscopy  2001  . Abdominal tumor  2002  . Thymus tumor  10/2000    Family History  Problem Relation Age of Onset  . Alzheimer's disease Mother   . Heart disease Mother   . Heart disease Father   . Liver disease Father   . Cancer Sister     Social history:  reports that she quit smoking about 33 years ago. Her smoking use included Cigarettes. She smoked 0.00 packs per day. She does not have any smokeless tobacco history on file. She reports that she does not drink alcohol or use illicit drugs.    Allergies   Allergen Reactions  . Penicillins     Reaction not known  . Sulfamethoxazole-Trimethoprim     Reaction not known    Medications:  Current Outpatient Prescriptions on File Prior to Visit  Medication Sig Dispense Refill  . aspirin EC 81 MG tablet Take 81 mg by mouth daily as needed.       . beclomethasone (QVAR) 80 MCG/ACT inhaler Inhale 1 puff into the lungs 2 (two) times daily as needed. For wheezing  1 Inhaler  5  . Cholecalciferol (VITAMIN D-3) 5000 UNITS TABS Take by mouth daily. 1 by mouth daily      . Exenatide ER 2 MG PEN Inject 2 mg into the skin once a week.  4 each  6  . folic acid (FOLVITE) A999333 MCG tablet Take 400 mcg by mouth 2 (two) times daily as needed.       . furosemide (LASIX) 20 MG tablet Take 1 tablet (20 mg total) by mouth 2 (two) times daily as needed. Use as needed for 3 pounds gain in one day or 5 pounds in one week  60 tablet  5  . gabapentin (NEURONTIN) 600 MG tablet Take 1 tablet (600 mg total) by mouth 3 (three) times daily.  90 tablet  5  . glucose blood (ACCU-CHEK AVIVA) test strip Use as instructed to check glucose 3 to 4 times daily to control blood sugar. 250.00  100 each  12  . HYDROcodone-acetaminophen (NORCO) 10-325 MG per tablet Take one tablet every three hours as needed for pain  240 tablet  0  . insulin aspart (NOVOLOG FLEXPEN) 100 UNIT/ML injection Inject 16 Units into the skin 3 (three) times daily before meals.  1 vial  6  . Insulin Pen Needle 31G X 5 MM MISC Use with Insulin pen as instructed. Dx 250.00  100 each  11  . ipratropium-albuterol (DUONEB) 0.5-2.5 (3) MG/3ML SOLN Take 3 mLs by nebulization. Use every 6 hours as needed for wheezing and SOB      . LANTUS 100 UNIT/ML injection INJECT 70 UNITS UNDER THE SKIN EVERY NIGHT AT BEDTIME FOR DIABETES  30 mL  5  . lidocaine (LIDODERM) 5 % Place 1 patch onto the skin as needed.  30 patch  0  . lisinopril (PRINIVIL,ZESTRIL) 5 MG tablet Take 1 tablet (5 mg total) by mouth daily.  30 tablet  3  .  Nystatin (NYAMYC) 100000 UNIT/GM POWD Apply 1 g topically daily as needed (for itching).       . nystatin-triamcinolone (MYCOLOG II) cream Apply topically 4 (four) times daily. Apply under abdomen folds twice daily until rash healed      . omega-3 acid ethyl esters (LOVAZA) 1 G capsule Take 1 capsule (1 g total) by mouth daily.  [place]  capsule  5  . oxyCODONE (OXY IR/ROXICODONE) 5 MG immediate release tablet Take one tablet every 6 hours as needed for severe pain, Take 2 tablets at bedtime.  180 tablet  0  . pantoprazole (PROTONIX) 40 MG tablet Take 1 tablet (40 mg total) by mouth daily.  30 tablet  5  . polyvinyl alcohol (LIQUIFILM TEARS) 1.4 % ophthalmic solution Place 1 drop into both eyes as needed (for dry eyes).       . potassium chloride (MICRO-K) 10 MEQ CR capsule Take 1 capsule (10 mEq total) by mouth 2 (two) times daily.  60 capsule  5  . promethazine (PHENERGAN) 12.5 MG tablet TAKE 1 TABLET BY MOUTH AS NEEDED FOR SEVERE NAUSEA AS DIRECTED  30 tablet  0  . SPIRIVA HANDIHALER 18 MCG inhalation capsule PLACE 1 CAPSULE INTO INHALER AND INHALE DAILY  30 capsule  3  . SYNTHROID 200 MCG tablet TAKE 1 TABLET BY MOUTH EVERY MORNING ON AN EMPTY STOMACH, SEPERATE FROM OTHER MEDICATIONS  30 tablet  2  . traZODone (DESYREL) 50 MG tablet Take 0.5-1 tablets (25-50 mg total) by mouth at bedtime as needed for sleep.  30 tablet  0  . triamterene-hydrochlorothiazide (MAXZIDE-25) 37.5-25 MG per tablet TAKE 1 TABLET BY MOUTH ONCE DAILY TO CONTROL BLOOD PRESSURE  30 tablet  5  . XELJANZ 5 MG TABS Take 5 mg by mouth daily. For Rheumatoid Arthritis       No current facility-administered medications on file prior to visit.    ROS:  Out of a complete 14 system review of symptoms, the patient complains only of the following symptoms, and all other reviewed systems are negative.  Fatigue Neck pain, neck stiffness, facial swelling Eye itching, eye redness Shortness of breath Excessive thirst, flushing Restless  legs, insomnia, frequent waking Environmental allergies Joint pain, joint swelling, back pain, achy muscles, walking difficulties Headache  Blood pressure 185/83, pulse 111, weight 278 lb (126.1 kg).  Physical Exam  General: The patient is alert and cooperative at the time of the examination. The patient is moderately to markedly obese.  Skin: No significant peripheral edema is noted.   Neurologic Exam  Mental status: The patient is oriented x 3.  Cranial nerves: Facial symmetry is present. Speech is normal, no aphasia or dysarthria is noted. Extraocular movements are full. Visual fields are full.  Motor: The patient has good strength in all 4 extremities.  Sensory examination: Soft sensation is symmetric on the face, arms, and legs.  Coordination: The patient has good finger-nose-finger and heel-to-shin bilaterally.  Gait and station: The patient has a slightly wide-based gait, the patient uses a quad cane for ambulation. Tandem gait is unsteady. Romberg is negative. No drift is seen.  Reflexes: Deep tendon reflexes are symmetric , but are depressed.   Assessment/Plan:  1. Peripheral neuropathy  2. Diabetes  3. Sjogren's syndrome  4. Chronic low back pain  5. Fibromyalgia  6. Obesity  The patient will be increased on the Cymbalta taking 60 mg twice daily. The patient will continue the gabapentin taking 600 mg 3 times daily. The patient will followup through this office in 6 months.  Jill Alexanders MD 11/07/2013 5:28 PM  [facility] [address] [place], [place] [address]  Phone [phone] Fax [phone]

## 2013-11-08 ENCOUNTER — Ambulatory Visit: Payer: Medicare Other | Admitting: Neurology

## 2013-11-11 ENCOUNTER — Ambulatory Visit (INDEPENDENT_AMBULATORY_CARE_PROVIDER_SITE_OTHER): Payer: Medicare Other | Admitting: Pharmacotherapy

## 2013-11-11 ENCOUNTER — Encounter: Payer: Self-pay | Admitting: Pharmacotherapy

## 2013-11-11 VITALS — BP 140/78 | HR 90 | Resp 10 | Wt 277.8 lb

## 2013-11-11 DIAGNOSIS — I10 Essential (primary) hypertension: Secondary | ICD-10-CM | POA: Diagnosis not present

## 2013-11-11 DIAGNOSIS — E1159 Type 2 diabetes mellitus with other circulatory complications: Secondary | ICD-10-CM

## 2013-11-11 MED ORDER — LIRAGLUTIDE 18 MG/3ML ~~LOC~~ SOPN: 1.8000 mg | PEN_INJECTOR | Freq: Every day | SUBCUTANEOUS | Status: DC

## 2013-11-11 NOTE — Progress Notes (Signed)
  Subjective:    Katherine Delgado is a 73 y.o.white female who presents for follow-up of Type 2 diabetes mellitus.  She only took Bydureon for 4 weeks due to insurance / cost issues.  Her insurance prefers Victoza. She says her BG was better while taking Bydureon. She says she is having more hypoglycemia.  BG 80-20m/dl Feels very bad if BG <100 Average BG:  141mdl  Denies problems with feet. Some blurry vision.  She does have a right cataract.  Needs eye exam. Some peripheral edema. Nocturia 2-3 times per night. Not sleeping through the night.  Eating smaller portions. No routine exercise.  Limited due to joint pain.  Review of Systems A comprehensive review of systems was negative except for: Constitutional: positive for not sleeping well Genitourinary: positive for nocturia Musculoskeletal: positive for arthralgias and stiff joints    Objective:    BP 140/78  Pulse 90  Resp 10  Wt 277 lb 12.8 oz (126.009 kg)  SpO2 97%  General:  alert, cooperative and no distress  Oropharynx: normal findings: lips normal without lesions and gums healthy   Eyes:  negative findings: lids and lashes normal and corneas clear   Ears:  external ears normal        Lung: clear to auscultation bilaterally  Heart:  regular rate and rhythm     Extremities: edema bilaterally  Skin: warm and dry, no hyperpigmentation, vitiligo, or suspicious lesions     Neuro: mental status, speech normal, alert and oriented x3 and uses a cane to assist with ambulation   Lab Review Glucose (mg/dL)  Date Value  09/10/2013 147*  04/16/2013 170*  04/01/2013 252*     Glucose, Bld (mg/dL)  Date Value  10/20/2011 180*     CO2 (mmol/L)  Date Value  09/10/2013 26   04/16/2013 25   04/01/2013 27      BUN (mg/dL)  Date Value  09/10/2013 18   04/16/2013 14   04/01/2013 17   10/20/2011 28*     Creatinine, Ser (mg/dL)  Date Value  09/10/2013 0.93   04/16/2013 1.01*  04/01/2013 0.80        Assessment:    Diabetes Mellitus type II, under good control.    Plan:    1.  Rx changes: will change Bydureon to Victoza per insurance formulary. 2.  Start Victoza 0.91m5mD x 1 week, 1.2mg34m x 1 week, then 1.8mg 17mly.  Counseled on risk / benefit of medication.  Since she tolerated Bydureon without problems, I do not anticipate problems with Victoza other than nausea. 3.  Continue Lantus 70 units daily. 4.  Continue Novolog 16 units with meals. 5.  Counseled on real vs. Relative hypoglycemia.  If BG are consistently <70 will need to reduce insulin doses. 6.  Exercise goal is 30-45 minutes 5 x week. 7.  BP at goal <140/90.  Continue Lisinopril, Maxzide-25, and Lasix. 8.  Has OV with Dr. Reed Mariea Clonts weeks with labs.  Will follow up in 3 months.

## 2013-11-11 NOTE — Patient Instructions (Signed)
Start Victoza. Give 0.6mg  once a day for 1 week. Then give 1.2mg  once a day for 1 week. Then start 1.8mg  once daily.

## 2013-11-20 ENCOUNTER — Other Ambulatory Visit: Payer: Self-pay | Admitting: *Deleted

## 2013-11-20 DIAGNOSIS — G8929 Other chronic pain: Secondary | ICD-10-CM

## 2013-11-20 DIAGNOSIS — M545 Low back pain: Secondary | ICD-10-CM

## 2013-11-20 DIAGNOSIS — M069 Rheumatoid arthritis, unspecified: Secondary | ICD-10-CM

## 2013-11-20 MED ORDER — HYDROCODONE-ACETAMINOPHEN 10-325 MG PO TABS: ORAL_TABLET | ORAL | Status: DC

## 2013-11-20 MED ORDER — OXYCODONE HCL 5 MG PO TABS: ORAL_TABLET | ORAL | Status: DC

## 2013-12-04 DIAGNOSIS — M1710 Unilateral primary osteoarthritis, unspecified knee: Secondary | ICD-10-CM | POA: Diagnosis not present

## 2013-12-11 DIAGNOSIS — M1710 Unilateral primary osteoarthritis, unspecified knee: Secondary | ICD-10-CM | POA: Diagnosis not present

## 2013-12-17 ENCOUNTER — Other Ambulatory Visit: Payer: Medicare Other

## 2013-12-17 ENCOUNTER — Other Ambulatory Visit: Payer: Self-pay

## 2013-12-18 ENCOUNTER — Other Ambulatory Visit: Payer: Medicare Other

## 2013-12-18 DIAGNOSIS — M797 Fibromyalgia: Secondary | ICD-10-CM

## 2013-12-18 DIAGNOSIS — Z1239 Encounter for other screening for malignant neoplasm of breast: Secondary | ICD-10-CM | POA: Diagnosis not present

## 2013-12-18 DIAGNOSIS — E1159 Type 2 diabetes mellitus with other circulatory complications: Secondary | ICD-10-CM | POA: Diagnosis not present

## 2013-12-18 DIAGNOSIS — E039 Hypothyroidism, unspecified: Secondary | ICD-10-CM

## 2013-12-18 DIAGNOSIS — G9332 Myalgic encephalomyelitis/chronic fatigue syndrome: Secondary | ICD-10-CM

## 2013-12-18 DIAGNOSIS — R5382 Chronic fatigue, unspecified: Principal | ICD-10-CM

## 2013-12-18 DIAGNOSIS — M1710 Unilateral primary osteoarthritis, unspecified knee: Secondary | ICD-10-CM | POA: Diagnosis not present

## 2013-12-18 DIAGNOSIS — M899 Disorder of bone, unspecified: Secondary | ICD-10-CM | POA: Diagnosis not present

## 2013-12-18 DIAGNOSIS — E785 Hyperlipidemia, unspecified: Secondary | ICD-10-CM

## 2013-12-18 DIAGNOSIS — M8580 Other specified disorders of bone density and structure, unspecified site: Secondary | ICD-10-CM

## 2013-12-19 ENCOUNTER — Other Ambulatory Visit: Payer: Self-pay

## 2013-12-19 ENCOUNTER — Ambulatory Visit: Payer: Medicare Other | Admitting: Internal Medicine

## 2013-12-19 DIAGNOSIS — G8929 Other chronic pain: Secondary | ICD-10-CM

## 2013-12-19 DIAGNOSIS — M545 Low back pain: Secondary | ICD-10-CM

## 2013-12-19 DIAGNOSIS — M069 Rheumatoid arthritis, unspecified: Secondary | ICD-10-CM

## 2013-12-19 LAB — CBC WITH DIFFERENTIAL/PLATELET
Basophils Absolute: 0.1 10*3/uL (ref 0.0–0.2)
Basos: 1 %
Eos: 3 %
Eosinophils Absolute: 0.4 10*3/uL (ref 0.0–0.4)
HCT: 44.2 % (ref 34.0–46.6)
Hemoglobin: 14.2 g/dL (ref 11.1–15.9)
Immature Grans (Abs): 0 10*3/uL (ref 0.0–0.1)
Immature Granulocytes: 0 %
Lymphocytes Absolute: 2.1 10*3/uL (ref 0.7–3.1)
Lymphs: 16 %
MCH: 26.7 pg (ref 26.6–33.0)
MCHC: 32.1 g/dL (ref 31.5–35.7)
MCV: 83 fL (ref 79–97)
Monocytes Absolute: 1.1 10*3/uL — ABNORMAL HIGH (ref 0.1–0.9)
Monocytes: 8 %
Neutrophils Absolute: 9.9 10*3/uL — ABNORMAL HIGH (ref 1.4–7.0)
Neutrophils Relative %: 72 %
RBC: 5.31 x10E6/uL — ABNORMAL HIGH (ref 3.77–5.28)
RDW: 14.1 % (ref 12.3–15.4)
WBC: 13.6 10*3/uL — ABNORMAL HIGH (ref 3.4–10.8)

## 2013-12-19 LAB — TSH: TSH: 0.223 u[IU]/mL — ABNORMAL LOW (ref 0.450–4.500)

## 2013-12-19 LAB — COMPREHENSIVE METABOLIC PANEL
ALT: 24 IU/L (ref 0–32)
AST: 37 IU/L (ref 0–40)
Albumin/Globulin Ratio: 1.5 (ref 1.1–2.5)
Albumin: 4 g/dL (ref 3.5–4.8)
Alkaline Phosphatase: 124 IU/L — ABNORMAL HIGH (ref 39–117)
BUN/Creatinine Ratio: 17 (ref 11–26)
BUN: 18 mg/dL (ref 8–27)
CO2: 28 mmol/L (ref 18–29)
Calcium: 9.7 mg/dL (ref 8.7–10.3)
Chloride: 97 mmol/L (ref 97–108)
Creatinine, Ser: 1.04 mg/dL — ABNORMAL HIGH (ref 0.57–1.00)
GFR calc Af Amer: 62 mL/min/{1.73_m2} (ref >59)
GFR calc non Af Amer: 54 mL/min/{1.73_m2} — ABNORMAL LOW (ref >59)
Globulin, Total: 2.7 g/dL (ref 1.5–4.5)
Glucose: 125 mg/dL — ABNORMAL HIGH (ref 65–99)
Potassium: 4 mmol/L (ref 3.5–5.2)
Sodium: 142 mmol/L (ref 134–144)
Total Bilirubin: 0.4 mg/dL (ref 0.0–1.2)
Total Protein: 6.7 g/dL (ref 6.0–8.5)

## 2013-12-19 LAB — LIPID PANEL
Chol/HDL Ratio: 4.6 ratio units — ABNORMAL HIGH (ref 0.0–4.4)
Cholesterol, Total: 139 mg/dL (ref 100–199)
HDL: 30 mg/dL — ABNORMAL LOW (ref >39)
LDL Calculated: 76 mg/dL (ref 0–99)
Triglycerides: 163 mg/dL — ABNORMAL HIGH (ref 0–149)
VLDL Cholesterol Cal: 33 mg/dL (ref 5–40)

## 2013-12-19 LAB — HEMOGLOBIN A1C
Est. average glucose Bld gHb Est-mCnc: 206 mg/dL
Hgb A1c MFr Bld: 8.8 % — ABNORMAL HIGH (ref 4.8–5.6)

## 2013-12-19 MED ORDER — OXYCODONE HCL 5 MG PO TABS: ORAL_TABLET | ORAL | Status: DC

## 2013-12-19 MED ORDER — HYDROCODONE-ACETAMINOPHEN 10-325 MG PO TABS: ORAL_TABLET | ORAL | Status: DC

## 2013-12-19 NOTE — Telephone Encounter (Signed)
Patient aware rx will be available tomorrow

## 2013-12-25 ENCOUNTER — Telehealth: Payer: Self-pay

## 2013-12-25 DIAGNOSIS — M1710 Unilateral primary osteoarthritis, unspecified knee: Secondary | ICD-10-CM | POA: Diagnosis not present

## 2013-12-25 MED ORDER — LEVOTHYROXINE SODIUM 100 MCG PO TABS: ORAL_TABLET | ORAL | Status: DC

## 2013-12-25 MED ORDER — LEVOTHYROXINE SODIUM 88 MCG PO TABS: 88.0000 ug | ORAL_TABLET | Freq: Every day | ORAL | Status: DC

## 2013-12-25 NOTE — Telephone Encounter (Signed)
Discussed labs with patient. Patient verbalized medication changes, med list update to reflect change in thyroid medication

## 2013-12-25 NOTE — Telephone Encounter (Signed)
Message copied by Logan Bores on Wed Dec 25, 2013  3:46 PM ------      Message from: [place], Rexene Edison      Created: Tue Dec 24, 2013 11:10 AM       Labs are stable except tsh now low.  Decrease synthroid to 188 mcg q am on empty stomach separate from other meds.  She may need to take a 100 and an 88 to get this dose.  175 was not enough and 200 is too much.  hba1c has improved some fortunately.  Will discuss remaining labs at her visit. ------

## 2013-12-30 ENCOUNTER — Other Ambulatory Visit: Payer: Self-pay | Admitting: Internal Medicine

## 2014-01-01 DIAGNOSIS — M1710 Unilateral primary osteoarthritis, unspecified knee: Secondary | ICD-10-CM | POA: Diagnosis not present

## 2014-01-08 ENCOUNTER — Telehealth: Payer: Self-pay | Admitting: Neurology

## 2014-01-08 DIAGNOSIS — M545 Low back pain, unspecified: Secondary | ICD-10-CM

## 2014-01-08 NOTE — Telephone Encounter (Signed)
I called patient. The patient is having recurrence of her back pain and leg pain, particularly into the right hip and upper leg. In the past, the patient had epidural steroid injections back in September 2014 which were helpful. I will set this up again.

## 2014-01-08 NOTE — Telephone Encounter (Signed)
Pt calling stating that she is having pain and numbness in both legs and wants to know if she could get a steroid injection, like before. Please advise

## 2014-01-09 ENCOUNTER — Other Ambulatory Visit: Payer: Self-pay | Admitting: Neurology

## 2014-01-09 DIAGNOSIS — M47817 Spondylosis without myelopathy or radiculopathy, lumbosacral region: Secondary | ICD-10-CM

## 2014-01-15 ENCOUNTER — Ambulatory Visit
Admission: RE | Admit: 2014-01-15 | Discharge: 2014-01-15 | Disposition: A | Discharge status: Home / Self Care | Payer: Medicare Other | Source: Ambulatory Visit | Attending: Neurology | Admitting: Neurology

## 2014-01-15 ENCOUNTER — Other Ambulatory Visit: Payer: Self-pay | Admitting: Neurology

## 2014-01-15 VITALS — BP 174/86 | HR 80

## 2014-01-15 DIAGNOSIS — M545 Low back pain, unspecified: Secondary | ICD-10-CM | POA: Diagnosis not present

## 2014-01-15 DIAGNOSIS — M47817 Spondylosis without myelopathy or radiculopathy, lumbosacral region: Secondary | ICD-10-CM

## 2014-01-15 MED ORDER — METHYLPREDNISOLONE ACETATE 40 MG/ML INJ SUSP (RADIOLOG
120.0000 mg | Freq: Once | INTRAMUSCULAR | Status: AC
  Administered 2014-01-15: 120 mg via EPIDURAL

## 2014-01-15 MED ORDER — IOHEXOL 180 MG/ML  SOLN
1.0000 mL | Freq: Once | INTRAMUSCULAR | Status: AC | PRN
  Administered 2014-01-15: 1 mL via EPIDURAL

## 2014-01-15 NOTE — Discharge Instructions (Signed)

## 2014-01-16 ENCOUNTER — Ambulatory Visit (INDEPENDENT_AMBULATORY_CARE_PROVIDER_SITE_OTHER): Payer: Medicare Other | Admitting: Internal Medicine

## 2014-01-16 ENCOUNTER — Encounter: Payer: Self-pay | Admitting: Internal Medicine

## 2014-01-16 VITALS — BP 170/82 | HR 91 | Temp 98.0°F | Resp 20 | Ht 68.0 in | Wt 275.6 lb

## 2014-01-16 DIAGNOSIS — M545 Low back pain, unspecified: Secondary | ICD-10-CM

## 2014-01-16 DIAGNOSIS — M5430 Sciatica, unspecified side: Secondary | ICD-10-CM | POA: Diagnosis not present

## 2014-01-16 DIAGNOSIS — G9332 Myalgic encephalomyelitis/chronic fatigue syndrome: Secondary | ICD-10-CM

## 2014-01-16 DIAGNOSIS — M069 Rheumatoid arthritis, unspecified: Secondary | ICD-10-CM

## 2014-01-16 DIAGNOSIS — H9209 Otalgia, unspecified ear: Secondary | ICD-10-CM | POA: Diagnosis not present

## 2014-01-16 DIAGNOSIS — E1159 Type 2 diabetes mellitus with other circulatory complications: Secondary | ICD-10-CM

## 2014-01-16 DIAGNOSIS — G8929 Other chronic pain: Secondary | ICD-10-CM

## 2014-01-16 DIAGNOSIS — R5382 Chronic fatigue, unspecified: Secondary | ICD-10-CM | POA: Diagnosis not present

## 2014-01-16 DIAGNOSIS — H9201 Otalgia, right ear: Secondary | ICD-10-CM

## 2014-01-16 DIAGNOSIS — M797 Fibromyalgia: Secondary | ICD-10-CM

## 2014-01-16 DIAGNOSIS — Z5181 Encounter for therapeutic drug level monitoring: Secondary | ICD-10-CM | POA: Diagnosis not present

## 2014-01-16 DIAGNOSIS — E039 Hypothyroidism, unspecified: Secondary | ICD-10-CM

## 2014-01-16 DIAGNOSIS — M5441 Lumbago with sciatica, right side: Secondary | ICD-10-CM

## 2014-01-16 MED ORDER — HYDROCODONE-ACETAMINOPHEN 10-325 MG PO TABS: ORAL_TABLET | ORAL | Status: DC

## 2014-01-16 MED ORDER — OXYCODONE HCL 5 MG PO TABS: ORAL_TABLET | ORAL | Status: DC

## 2014-01-16 NOTE — Progress Notes (Signed)
Patient ID: Katherine Delgado, female   DOB: February 09, 1941, 73 y.o.   MRN: [id number]   Location:  [facility] / [facility]  Code Status: DNR in event of cardiac or respiratory arrest;  See completed MOST form from today  Allergies  Allergen Reactions  . Penicillins     Reaction not known  . Sulfamethoxazole-Trimethoprim     Reaction not known    Chief Complaint  Patient presents with  . Medical Management of Chronic Issues    HPI: Patient is a 73 y.o. white female seen in the office today for medical mgt of many chronic disease states.   Had 5 wks of injections in knees with benefit (Dr. Kathee Delton)  Had back injections yesterday (steroid) with benefit--right L4 nerve root block and transforaminal epidural.  Still feels a bit numb from that--done at [place].  Right nerve pain down leg and hip pain is better--even felt better yesterday already.    Glucose today after lunch 205.  Thinks she's doing ok with sugars as of late despite this injection yesterday.  Thinks she has all of her meds.  Needs her pain meds renewed.    Breathing:  So so right now.  Bronchitis--has some cough and congestion in her anterior chest.  Does her spiriva, inhalers--does not completely clear up altogether.  Sometimes yellowish sputum.    Right ear bothers her.  Thinks it is sinuses.  Wonders if she needs a round of abx.  Last time was Dec 2014.    Review of Systems:  Review of Systems  Constitutional: Negative for fever and malaise/fatigue.  HENT: Positive for congestion and ear pain.        Right  Eyes: Negative for blurred vision.  Respiratory: Positive for cough and shortness of breath.   Cardiovascular: Negative for chest pain and leg swelling.  Gastrointestinal: Negative for abdominal pain and constipation.  Genitourinary: Negative for dysuria.  Musculoskeletal: Positive for back pain and joint pain. Negative for falls.  Skin: Negative for rash.    Neurological: Negative for dizziness, loss of consciousness and weakness.  Endo/Heme/Allergies: Bruises/bleeds easily.  Psychiatric/Behavioral: Negative for depression and memory loss. The patient is not nervous/anxious.     Past Medical History  Diagnosis Date  . COPD (chronic obstructive pulmonary disease)   . Hypertension   . RA (rheumatoid arthritis)   . Thyroid disease   . Miscarriage 1962  . History of benign thymus tumor   . Back injury   . GERD (gastroesophageal reflux disease)   . Arthritis   . Fibromyalgia   . Diabetes mellitus without complication   . Thyroid disorder   . Acute infective polyneuritis 2002  . Guillain-Barre syndrome   . Polyneuropathy in diabetes(357.2)   . Morbid obesity   . Spondylosis, lumbosacral   . Degenerative arthritis   . Dyslipidemia   . Spinal stenosis, lumbar region, without neurogenic claudication   . Candidiasis of skin and nails   . Acute maxillary sinusitis   . Insomnia, unspecified   . Candidiasis of vulva and vagina   . Edema   . Acute sinusitis, unspecified   . Obstructive chronic bronchitis with acute bronchitis   . Urinary tract infection, site not specified   . Unspecified hypothyroidism   . Type II or unspecified type diabetes mellitus with peripheral circulatory disorders, uncontrolled(250.72)   . Mixed hyperlipidemia   . Other specified disease of white blood cells   . Depressive disorder, not elsewhere classified   .  Chronic pain syndrome   . Tear film insufficiency, unspecified   . Unspecified essential hypertension   . Allergic rhinitis due to pollen   . Unspecified chronic bronchitis   . Reflux esophagitis   . Diaphragmatic hernia without mention of obstruction or gangrene   . Unspecified pruritic disorder   . Rheumatoid arthritis(714.0)   . Pain in joint, multiple sites   . Stiffness of joints, not elsewhere classified, multiple sites   . Lumbago   . Other malaise and fatigue   . Spontaneous ecchymoses    . Abnormal weight gain   . Shortness of breath   . Cough   . Unspecified urinary incontinence   . Osteopenia, senile     Past Surgical History  Procedure Laterality Date  . Abdominal hysterectomy  1974  . Tonsillectomy    . Cholecystectomy  1984  . Back surgery  1982  . Ovarian cyst surgery  1968  . Thymus tumor    . Knee surgery Bilateral 08/27/2010 (L) and 01/11/2011 (R)  . [place]  . Bronchoscopy  2001  . Abdominal tumor  2002  . Thymus tumor  10/2000    Social History:   reports that she quit smoking about 34 years ago. Her smoking use included Cigarettes. She smoked 0.00 packs per day. She does not have any smokeless tobacco history on file. She reports that she does not drink alcohol or use illicit drugs.  Family History  Problem Relation Age of Onset  . Alzheimer's disease Mother   . Heart disease Mother   . Heart disease Father   . Liver disease Father   . Cancer Sister     Medications: Patient's Medications  New Prescriptions   No medications on file  Previous Medications   ASPIRIN EC 81 MG TABLET    Take 81 mg by mouth daily as needed.    BECLOMETHASONE (QVAR) 80 MCG/ACT INHALER    Inhale 1 puff into the lungs 2 (two) times daily as needed. For wheezing   CHOLECALCIFEROL (VITAMIN D-3) 5000 UNITS TABS    Take by mouth daily. 1 by mouth daily   DULOXETINE (CYMBALTA) 60 MG CAPSULE    Take 1 capsule (60 mg total) by mouth 2 (two) times daily.   FOLIC ACID (FOLVITE) 268 MCG TABLET    Take 400 mcg by mouth 2 (two) times daily as needed.    FUROSEMIDE (LASIX) 20 MG TABLET    Take 1 tablet (20 mg total) by mouth 2 (two) times daily as needed. Use as needed for 3 pounds gain in one day or 5 pounds in one week   GABAPENTIN (NEURONTIN) 600 MG TABLET    Take 1 tablet (600 mg total) by mouth 3 (three) times daily.   GLUCOSE BLOOD (ACCU-CHEK AVIVA) TEST STRIP    Use as instructed to check glucose 3 to 4 times daily to control blood sugar. 250.00    HYDROCODONE-ACETAMINOPHEN (NORCO) 10-325 MG PER TABLET    Take one tablet every three hours as needed for pain   INSULIN ASPART (NOVOLOG FLEXPEN) 100 UNIT/ML INJECTION    Inject 16 Units into the skin 3 (three) times daily before meals.   INSULIN PEN NEEDLE 31G X 5 MM MISC    Use with Insulin pen as instructed. Dx 250.00   IPRATROPIUM-ALBUTEROL (DUONEB) 0.5-2.5 (3) MG/3ML SOLN    Take 3 mLs by nebulization. Use every 6 hours as needed for wheezing and SOB   LANTUS 100 UNIT/ML INJECTION  INJECT 70 UNITS UNDER THE SKIN EVERY NIGHT AT BEDTIME FOR DIABETES   LEVOTHYROXINE (SYNTHROID, LEVOTHROID) 100 MCG TABLET    Take 1 by mouth with 88 mcg for a total of 188 mcg   LEVOTHYROXINE (SYNTHROID, LEVOTHROID) 88 MCG TABLET    Take 1 tablet (88 mcg total) by mouth daily before breakfast. Take 1 by mouth with 100 mcg for a total of 188 mcg   LIDOCAINE (LIDODERM) 5 %    Place 1 patch onto the skin as needed.   LIRAGLUTIDE (VICTOZA) 18 MG/3ML SOPN    Inject 1.8 mg into the skin daily.   LISINOPRIL (PRINIVIL,ZESTRIL) 5 MG TABLET    Take 1 tablet (5 mg total) by mouth daily.   NYSTATIN (NYAMYC) 100000 UNIT/GM POWD    Apply 1 g topically daily as needed (for itching).    NYSTATIN-TRIAMCINOLONE (MYCOLOG II) CREAM    Apply topically 4 (four) times daily. Apply under abdomen folds twice daily until rash healed   OMEGA-3 ACID ETHYL ESTERS (LOVAZA) 1 G CAPSULE    Take 1 capsule (1 g total) by mouth daily.   OXYCODONE (OXY IR/ROXICODONE) 5 MG IMMEDIATE RELEASE TABLET    Take one tablet every 6 hours as needed for severe pain, Take 2 tablets at bedtime.   PANTOPRAZOLE (PROTONIX) 40 MG TABLET    TAKE 1 TABLET BY MOUTH EVERY DAY   PILOCARPINE (SALAGEN) 5 MG TABLET    Take 5 mg by mouth daily.   POLYVINYL ALCOHOL (LIQUIFILM TEARS) 1.4 % OPHTHALMIC SOLUTION    Place 1 drop into both eyes as needed (for dry eyes).    POTASSIUM CHLORIDE (MICRO-K) 10 MEQ CR CAPSULE    Take 1 capsule (10 mEq total) by mouth 2 (two) times daily.     PROMETHAZINE (PHENERGAN) 12.5 MG TABLET    TAKE 1 TABLET BY MOUTH AS NEEDED FOR SEVERE NAUSEA AS DIRECTED   SPIRIVA HANDIHALER 18 MCG INHALATION CAPSULE    PLACE 1 CAPSULE INTO INHALER AND INHALE DAILY   TRAZODONE (DESYREL) 50 MG TABLET    Take 0.5-1 tablets (25-50 mg total) by mouth at bedtime as needed for sleep.   TRIAMTERENE-HYDROCHLOROTHIAZIDE (MAXZIDE-25) 37.5-25 MG PER TABLET    TAKE 1 TABLET BY MOUTH ONCE DAILY TO CONTROL BLOOD PRESSURE   XELJANZ 5 MG TABS    Take 5 mg by mouth daily. For Rheumatoid Arthritis  Modified Medications   No medications on file  Discontinued Medications   No medications on file     Physical Exam: Filed Vitals:   01/16/14 1317  BP: 170/82  Pulse: 91  Temp: 98 F (36.7 C)  TempSrc: Oral  Resp: 20  Height: 5\' 8"  (1.727 m)  Weight: 275 lb 9.6 oz (125.011 kg)  SpO2: 97%  Physical Exam  Constitutional: She is oriented to person, place, and time. She appears well-developed and well-nourished. No distress.  HENT:  Head: Normocephalic and atraumatic.  Neck: Neck supple. No JVD present.  Cardiovascular: Normal rate, regular rhythm, normal heart sounds and intact distal pulses.   Pulmonary/Chest: Effort normal. She has wheezes.  Abdominal: Soft. Bowel sounds are normal. She exhibits no distension and no mass. There is no tenderness.  Musculoskeletal: Normal range of motion.  Getting around in wheelchair right now, but is able to easily transfer from chair to wheelchair   Neurological: She is alert and oriented to person, place, and time.   Labs reviewed: Basic Metabolic Panel:  Recent Labs  04/16/13 0818 09/10/13 3235 12/18/13 0805  NA 141 140 142  K 4.5 3.8 4.0  CL 97 96* 97  CO2 25 26 28   GLUCOSE 170* 147* 125*  BUN 14 18 18   CREATININE 1.01* 0.93 1.04*  CALCIUM 9.6 9.6 9.7  TSH  --   --  0.223*   Liver Function Tests:  Recent Labs  04/01/13 1426 12/18/13 0805  AST 16 37  ALT 22 24  ALKPHOS 120* 124*  BILITOT 0.2 0.4  PROT  6.1 6.7  CBC:  Recent Labs  04/01/13 1426 04/16/13 0818 09/10/13 0844 12/18/13 0805  WBC 15.4* 14.8* 13.7* 13.6*  NEUTROABS 12.1* 10.7* 10.3* 9.9*  HGB 13.4 14.1 14.4 14.2  HCT 42.0 43.7 45.6 44.2  MCV 79 80 82 83  PLT 407*  --   --   --    Lipid Panel:  Recent Labs  04/16/13 0818 09/10/13 0844 12/18/13 0805  HDL 23* 32* 30*  LDLCALC 89 96 76  TRIG 157* 158* 163*  CHOLHDL 6.2* 5.0* 4.6*   Lab Results  Component Value Date   HGBA1C 8.8* 12/18/2013  Discussed allergic sinusitis as cause of her congestion--using some prn claritin  Assessment/Plan 1. Type II or unspecified type diabetes mellitus with peripheral circulatory disorders, uncontrolled(250.72) - just had a steroid injection in her back so glucose may be high for next few weeks - Comprehensive metabolic panel - Hemoglobin A1c  2. Chronic fatigue fibromyalgia syndrome - stable, seems better today in terms of mood and pain - Comprehensive metabolic panel -cont cymbalta which seemed to have helped  3. Right-sided low back pain with sciatica - improved since injection - Comprehensive metabolic panel  4. Otalgia of right ear -no evidence of acute otitis media or inflammation  -suspect this is due to her chronic intermittent parotitis  - CBC With differential/Platelet  5. Hypothyroidism - cont current synthroid - TSH  6. Chronic low back pain -cont oxycodone at bedtime, hydrocodone in the daytime as needed -UDS done  Labs/tests ordered:   Orders Placed This Encounter  Procedures  . CBC With differential/Platelet  . Comprehensive metabolic panel  . Hemoglobin A1c  . TSH  UDS as refilling pain medication--pt mentions she is out of the hydrocodone due to her back being so bad--was in prep for her procedure yesterday  Next appt:  3 mos

## 2014-01-17 ENCOUNTER — Other Ambulatory Visit: Payer: Self-pay

## 2014-01-17 LAB — CBC WITH DIFFERENTIAL
Basophils Absolute: 0.1 10*3/uL (ref 0.0–0.2)
Basos: 0 %
Eos: 1 %
Eosinophils Absolute: 0.3 10*3/uL (ref 0.0–0.4)
HCT: 45.5 % (ref 34.0–46.6)
Hemoglobin: 14.7 g/dL (ref 11.1–15.9)
Immature Grans (Abs): 0 10*3/uL (ref 0.0–0.1)
Immature Granulocytes: 0 %
Lymphocytes Absolute: 3.1 10*3/uL (ref 0.7–3.1)
Lymphs: 14 %
MCH: 26.6 pg (ref 26.6–33.0)
MCHC: 32.3 g/dL (ref 31.5–35.7)
MCV: 82 fL (ref 79–97)
Monocytes Absolute: 1.6 10*3/uL — ABNORMAL HIGH (ref 0.1–0.9)
Monocytes: 7 %
Neutrophils Absolute: 17.3 10*3/uL — ABNORMAL HIGH (ref 1.4–7.0)
Neutrophils Relative %: 78 %
Platelets: 363 10*3/uL (ref 150–379)
RBC: 5.52 x10E6/uL — ABNORMAL HIGH (ref 3.77–5.28)
RDW: 14.3 % (ref 12.3–15.4)
WBC: 22.5 10*3/uL (ref 3.4–10.8)

## 2014-01-17 LAB — COMPREHENSIVE METABOLIC PANEL
ALT: 19 IU/L (ref 0–32)
AST: 25 IU/L (ref 0–40)
Albumin/Globulin Ratio: 1.6 (ref 1.1–2.5)
Albumin: 4.4 g/dL (ref 3.5–4.8)
Alkaline Phosphatase: 163 IU/L — ABNORMAL HIGH (ref 39–117)
BUN/Creatinine Ratio: 19 (ref 11–26)
BUN: 21 mg/dL (ref 8–27)
CO2: 26 mmol/L (ref 18–29)
Calcium: 10 mg/dL (ref 8.7–10.3)
Chloride: 99 mmol/L (ref 97–108)
Creatinine, Ser: 1.09 mg/dL — ABNORMAL HIGH (ref 0.57–1.00)
GFR calc Af Amer: 58 mL/min/{1.73_m2} — ABNORMAL LOW (ref >59)
GFR calc non Af Amer: 50 mL/min/{1.73_m2} — ABNORMAL LOW (ref >59)
Globulin, Total: 2.8 g/dL (ref 1.5–4.5)
Glucose: 116 mg/dL — ABNORMAL HIGH (ref 65–99)
Potassium: 3.9 mmol/L (ref 3.5–5.2)
Sodium: 142 mmol/L (ref 134–144)
Total Bilirubin: 0.3 mg/dL (ref 0.0–1.2)
Total Protein: 7.2 g/dL (ref 6.0–8.5)

## 2014-01-17 LAB — HEMOGLOBIN A1C
Est. average glucose Bld gHb Est-mCnc: 220 mg/dL
Hgb A1c MFr Bld: 9.3 % — ABNORMAL HIGH (ref 4.8–5.6)

## 2014-01-17 LAB — TSH: TSH: 0.114 u[IU]/mL — ABNORMAL LOW (ref 0.450–4.500)

## 2014-01-17 MED ORDER — LEVOTHYROXINE SODIUM 175 MCG PO TABS: 175.0000 ug | ORAL_TABLET | Freq: Every day | ORAL | Status: DC

## 2014-01-22 DIAGNOSIS — M3500 Sicca syndrome, unspecified: Secondary | ICD-10-CM | POA: Diagnosis not present

## 2014-01-22 DIAGNOSIS — M1710 Unilateral primary osteoarthritis, unspecified knee: Secondary | ICD-10-CM | POA: Diagnosis not present

## 2014-01-22 DIAGNOSIS — M069 Rheumatoid arthritis, unspecified: Secondary | ICD-10-CM | POA: Diagnosis not present

## 2014-01-22 DIAGNOSIS — Z09 Encounter for follow-up examination after completed treatment for conditions other than malignant neoplasm: Secondary | ICD-10-CM | POA: Diagnosis not present

## 2014-01-30 ENCOUNTER — Other Ambulatory Visit: Payer: Self-pay | Admitting: Internal Medicine

## 2014-02-10 ENCOUNTER — Ambulatory Visit (INDEPENDENT_AMBULATORY_CARE_PROVIDER_SITE_OTHER): Payer: Medicare Other | Admitting: Pharmacotherapy

## 2014-02-10 ENCOUNTER — Encounter: Payer: Self-pay | Admitting: Pharmacotherapy

## 2014-02-10 VITALS — BP 166/82 | HR 80 | Temp 98.0°F | Wt 289.0 lb

## 2014-02-10 DIAGNOSIS — E0590 Thyrotoxicosis, unspecified without thyrotoxic crisis or storm: Secondary | ICD-10-CM | POA: Diagnosis not present

## 2014-02-10 DIAGNOSIS — I10 Essential (primary) hypertension: Secondary | ICD-10-CM | POA: Diagnosis not present

## 2014-02-10 DIAGNOSIS — E119 Type 2 diabetes mellitus without complications: Secondary | ICD-10-CM | POA: Diagnosis not present

## 2014-02-10 DIAGNOSIS — E1159 Type 2 diabetes mellitus with other circulatory complications: Secondary | ICD-10-CM

## 2014-02-10 MED ORDER — INSULIN GLARGINE 100 UNIT/ML ~~LOC~~ SOLN: SUBCUTANEOUS | Status: DC

## 2014-02-10 MED ORDER — INSULIN ASPART 100 UNIT/ML ~~LOC~~ SOLN: 20.0000 [IU] | Freq: Three times a day (TID) | SUBCUTANEOUS | Status: DC

## 2014-02-10 MED ORDER — LISINOPRIL 5 MG PO TABS: 5.0000 mg | ORAL_TABLET | Freq: Every day | ORAL | Status: DC

## 2014-02-10 NOTE — Progress Notes (Signed)
  Subjective:    Katherine Delgado is a 74 y.o.white female who presents for follow-up of Type 2 diabetes mellitus.   Her A1C is higher.  She has been on steroids. We had to change Bydureon to Victoza due to insurance coverage.  She has noticed higher BG She struggles with making healthy food choices. Exercise limited due to back pain (reason for steroid injections). Continues to have peripheral neuropathy symptoms. Some peripheral edema. Some blurry vision. Nocturia every night. 4-5 times per night. Not sleeping well due to pain.  Average BG:  161mg /dl No hypoglycemia.  She is out of her lisinopril.  Has not taken it for about 3 months.  No nausea.  Liked Bydureon better.   Review of Systems A comprehensive review of systems was negative except for: Constitutional: positive for not sleeping well Eyes: positive for blurry vision Cardiovascular: positive for lower extremity edema Genitourinary: positive for nocturia Neurological: positive for peripheral neuropathy Endocrine: positive for diabetic symptoms including blurry vision, increased fatigue and polydipsia    Objective:    BP 166/82  Pulse 80  Temp(Src) 98 F (36.7 C) (Oral)  Wt 289 lb (131.09 kg)  General:  alert, cooperative, no distress and morbidly obese  Oropharynx: normal findings: lips normal without lesions and gums healthy   Eyes:  negative findings: lids and lashes normal and conjunctivae and sclerae normal   Ears:  external ears normal        Lung: clear to auscultation bilaterally  Heart:  regular rate and rhythm     Extremities: edema bilateral lower extremeties  Skin: warm and dry, no hyperpigmentation, vitiligo, or suspicious lesions     Neuro: mental status, speech normal, alert and oriented x3 and gait and station normal   Lab Review Glucose (mg/dL)  Date Value  01/16/2014 116*  12/18/2013 125*  09/10/2013 147*     Glucose, Bld (mg/dL)  Date Value  10/20/2011 180*     CO2 (mmol/L)  Date  Value  01/16/2014 26   12/18/2013 28   09/10/2013 26      BUN (mg/dL)  Date Value  01/16/2014 21   12/18/2013 18   09/10/2013 18   10/20/2011 28*     Creatinine, Ser (mg/dL)  Date Value  01/16/2014 1.09*  12/18/2013 1.04*  09/10/2013 0.93     01/16/14: A1C:  9.3 AST:  25 ALT:  19 GFR:  52ml/min   Assessment:    Diabetes Mellitus type II, under poor control.  BP above goal <140/90   Plan:    1.  Rx changes: 1.  Increase Lantus 80 units daily (split as 2 injections of 40 units for absorption).  2.  Increase Novolog 20 units with each meal.  2.  Continue Victoza 1.8mg  daily. 3.  Counseled on nutrition goals. 4.  Counseled on exercise goals.  Goal is 30-45 minutes 5 x week. 5.  BP too high. Has not been taking lisinopril.  Restart Lisinopril 5mg  daily.

## 2014-02-11 ENCOUNTER — Encounter: Payer: Self-pay | Admitting: *Deleted

## 2014-02-11 LAB — TSH: TSH: 3.07 u[IU]/mL (ref 0.450–4.500)

## 2014-02-13 ENCOUNTER — Other Ambulatory Visit: Payer: Self-pay | Admitting: *Deleted

## 2014-02-13 DIAGNOSIS — M797 Fibromyalgia: Secondary | ICD-10-CM

## 2014-02-13 DIAGNOSIS — G8929 Other chronic pain: Secondary | ICD-10-CM

## 2014-02-13 DIAGNOSIS — G9332 Myalgic encephalomyelitis/chronic fatigue syndrome: Secondary | ICD-10-CM

## 2014-02-13 DIAGNOSIS — M069 Rheumatoid arthritis, unspecified: Secondary | ICD-10-CM

## 2014-02-13 DIAGNOSIS — M545 Low back pain: Secondary | ICD-10-CM

## 2014-02-13 DIAGNOSIS — R5382 Chronic fatigue, unspecified: Principal | ICD-10-CM

## 2014-02-13 MED ORDER — HYDROCODONE-ACETAMINOPHEN 10-325 MG PO TABS: ORAL_TABLET | ORAL | Status: DC

## 2014-02-13 MED ORDER — OXYCODONE HCL 5 MG PO TABS: ORAL_TABLET | ORAL | Status: DC

## 2014-02-13 NOTE — Telephone Encounter (Signed)
Printed per Dr. Mariea Clonts

## 2014-03-05 ENCOUNTER — Encounter: Payer: Self-pay | Admitting: Internal Medicine

## 2014-03-05 ENCOUNTER — Ambulatory Visit (INDEPENDENT_AMBULATORY_CARE_PROVIDER_SITE_OTHER): Payer: Medicare Other | Admitting: Internal Medicine

## 2014-03-05 VITALS — BP 154/78 | HR 98 | Temp 98.2°F | Resp 18 | Wt 278.6 lb

## 2014-03-05 DIAGNOSIS — J209 Acute bronchitis, unspecified: Secondary | ICD-10-CM | POA: Diagnosis not present

## 2014-03-05 DIAGNOSIS — J441 Chronic obstructive pulmonary disease with (acute) exacerbation: Secondary | ICD-10-CM

## 2014-03-05 MED ORDER — GUAIFENESIN ER 600 MG PO TB12: 600.0000 mg | ORAL_TABLET | Freq: Two times a day (BID) | ORAL | Status: DC | PRN

## 2014-03-05 MED ORDER — MOXIFLOXACIN HCL 400 MG PO TABS: 400.0000 mg | ORAL_TABLET | Freq: Every day | ORAL | Status: DC

## 2014-03-05 MED ORDER — LIDOCAINE 5 % EX PTCH: 1.0000 | MEDICATED_PATCH | CUTANEOUS | Status: DC | PRN

## 2014-03-05 MED ORDER — PREDNISONE 50 MG PO TABS: ORAL_TABLET | ORAL | Status: DC

## 2014-03-05 MED ORDER — SACCHAROMYCES BOULARDII 250 MG PO CAPS: 250.0000 mg | ORAL_CAPSULE | Freq: Two times a day (BID) | ORAL | Status: DC

## 2014-03-05 NOTE — Progress Notes (Signed)
Patient ID: Katherine Delgado, female   DOB: 1941/07/25, 73 y.o.   MRN: [id number]    Chief Complaint  Patient presents with  . Acute Visit    chest congestion, cough, feverish   Allergies  Allergen Reactions  . Penicillins     Reaction not known  . Sulfamethoxazole-Trimethoprim     Reaction not known   HPI 73 y/o female patient is here for acute visit.She was doing well until a week back. She started with cough and chest congestion a week back. She is having yellowish greenish phlegm. Denies hemoptysis. Felt feverish and had chills with sweating. Has tried OTC alkazetzer cold relief and mucinex without much help. She has been taking her breathing treatment with her history of copd. She has also been feeling gasey and has more burps. Has some shortness of breath with exertion Feels stuffed up in her sinuses.  Has stuffed ears but no earache or ear discharge Denies nasal discharge. Denies earaches Also has hx of DM, HTN, RA Denies abdominal pain, nausea or vomiting Denies sick contact Appetite is fair No urinary complaints  Review of Systems  HENT: Negative for hearing loss and sore throat.   Eyes: Negative for blurred vision, double vision and discharge.  Respiratory: Negative for wheezing.   Cardiovascular: Negative for chest pain, palpitations Gastrointestinal: Negative for nausea, vomiting, abdominal pain, diarrhea and constipation.  Musculoskeletal: Negative for new joint and back pain. Has hx of RA and fibromyalgia Skin: Negative for itching and rash.  Neurological: Negative for dizziness  Past Medical History  Diagnosis Date  . COPD (chronic obstructive pulmonary disease)   . Hypertension   . RA (rheumatoid arthritis)   . Thyroid disease   . Miscarriage 1962  . History of benign thymus tumor   . Back injury   . GERD (gastroesophageal reflux disease)   . Arthritis   . Fibromyalgia   . Diabetes mellitus without complication   . Thyroid disorder   . Acute infective  polyneuritis 2002  . Guillain-Barre syndrome   . Polyneuropathy in diabetes(357.2)   . Morbid obesity   . Spondylosis, lumbosacral   . Degenerative arthritis   . Dyslipidemia   . Spinal stenosis, lumbar region, without neurogenic claudication   . Candidiasis of skin and nails   . Acute maxillary sinusitis   . Insomnia, unspecified   . Candidiasis of vulva and vagina   . Edema   . Acute sinusitis, unspecified   . Obstructive chronic bronchitis with acute bronchitis   . Urinary tract infection, site not specified   . Unspecified hypothyroidism   . Type II or unspecified type diabetes mellitus with peripheral circulatory disorders, uncontrolled(250.72)   . Mixed hyperlipidemia   . Other specified disease of white blood cells   . Depressive disorder, not elsewhere classified   . Chronic pain syndrome   . Tear film insufficiency, unspecified   . Unspecified essential hypertension   . Allergic rhinitis due to pollen   . Unspecified chronic bronchitis   . Reflux esophagitis   . Diaphragmatic hernia without mention of obstruction or gangrene   . Unspecified pruritic disorder   . Rheumatoid arthritis(714.0)   . Pain in joint, multiple sites   . Stiffness of joints, not elsewhere classified, multiple sites   . Lumbago   . Other malaise and fatigue   . Spontaneous ecchymoses   . Abnormal weight gain   . Shortness of breath   . Cough   . Unspecified urinary incontinence   .  Osteopenia, senile    Current Outpatient Prescriptions on File Prior to Visit  Medication Sig Dispense Refill  . aspirin EC 81 MG tablet Take 81 mg by mouth daily as needed.       . beclomethasone (QVAR) 80 MCG/ACT inhaler Inhale 1 puff into the lungs 2 (two) times daily as needed. For wheezing  1 Inhaler  5  . Cholecalciferol (VITAMIN D-3) 5000 UNITS TABS Take by mouth daily. 1 by mouth daily      . DULoxetine (CYMBALTA) 60 MG capsule Take 1 capsule (60 mg total) by mouth 2 (two) times daily.  60 capsule  5  .  folic acid (FOLVITE) 716 MCG tablet Take 400 mcg by mouth 2 (two) times daily as needed.       . furosemide (LASIX) 20 MG tablet Take 1 tablet (20 mg total) by mouth 2 (two) times daily as needed. Use as needed for 3 pounds gain in one day or 5 pounds in one week  60 tablet  5  . gabapentin (NEURONTIN) 600 MG tablet Take 1 tablet (600 mg total) by mouth 3 (three) times daily.  90 tablet  5  . glucose blood (ACCU-CHEK AVIVA) test strip Use as instructed to check glucose 3 to 4 times daily to control blood sugar. 250.00  100 each  12  . HYDROcodone-acetaminophen (NORCO) 10-325 MG per tablet Take one tablet every three hours as needed for pain  240 tablet  0  . insulin aspart (NOVOLOG FLEXPEN) 100 UNIT/ML injection Inject 20 Units into the skin 3 (three) times daily before meals.  1 vial  6  . insulin glargine (LANTUS) 100 UNIT/ML injection Inject 80 units daily.  Split into 2 injections of 40 units to improve absorption.  30 mL  5  . Insulin Pen Needle 31G X 5 MM MISC Use with Insulin pen as instructed. Dx 250.00  100 each  11  . ipratropium-albuterol (DUONEB) 0.5-2.5 (3) MG/3ML SOLN Take 3 mLs by nebulization. Use every 6 hours as needed for wheezing and SOB      . levothyroxine (SYNTHROID, LEVOTHROID) 175 MCG tablet Take 1 tablet (175 mcg total) by mouth daily before breakfast.  30 tablet  1  . Liraglutide (VICTOZA) 18 MG/3ML SOPN Inject 1.8 mg into the skin daily.  3 pen  4  . lisinopril (PRINIVIL,ZESTRIL) 5 MG tablet Take 1 tablet (5 mg total) by mouth daily.  30 tablet  3  . Nystatin (NYAMYC) 100000 UNIT/GM POWD Apply 1 g topically daily as needed (for itching).       . nystatin-triamcinolone (MYCOLOG II) cream Apply topically 4 (four) times daily. Apply under abdomen folds twice daily until rash healed      . omega-3 acid ethyl esters (LOVAZA) 1 G capsule Take 1 capsule (1 g total) by mouth daily.  30 capsule  5  . oxyCODONE (OXY IR/ROXICODONE) 5 MG immediate release tablet Take one tablet every 6  hours as needed for severe pain, Take 2 tablets at bedtime.  180 tablet  0  . pantoprazole (PROTONIX) 40 MG tablet TAKE 1 TABLET BY MOUTH EVERY DAY  30 tablet  5  . pilocarpine (SALAGEN) 5 MG tablet Take 5 mg by mouth daily.      . polyvinyl alcohol (LIQUIFILM TEARS) 1.4 % ophthalmic solution Place 1 drop into both eyes as needed (for dry eyes).       . potassium chloride (MICRO-K) 10 MEQ CR capsule Take 1 capsule (10 mEq total) by mouth  2 (two) times daily.  60 capsule  5  . promethazine (PHENERGAN) 12.5 MG tablet TAKE 1 TABLET BY MOUTH AS NEEDED FOR SEVERE NAUSEA AS DIRECTED  30 tablet  0  . SPIRIVA HANDIHALER 18 MCG inhalation capsule PLACE 1 CAPSULE INTO INHALER AND INHALE DAILY  30 capsule  3  . traZODone (DESYREL) 50 MG tablet Take 0.5-1 tablets (25-50 mg total) by mouth at bedtime as needed for sleep.  30 tablet  0  . triamterene-hydrochlorothiazide (MAXZIDE-25) 37.5-25 MG per tablet TAKE 1 TABLET BY MOUTH ONCE DAILY TO CONTROL BLOOD PRESSURE  30 tablet  5  . XELJANZ 5 MG TABS Take 5 mg by mouth daily. For Rheumatoid Arthritis       No current facility-administered medications on file prior to visit.   Physical exam BP 154/78  Pulse 98  Temp(Src) 98.2 F (36.8 C) (Oral)  Resp 18  Wt 278 lb 9.6 oz (126.372 kg)  SpO2 95%  General- elderly female in no acute distress, obese Head- atraumatic, normocephalic Eyes- PERRLA, EOMI, no pallor, no icterus, no discharge Ears- left ear normal tympanic membrane and normal external ear canal , right ear normal tympanic membrane and normal external ear canal Neck- no lymphadenopathy, no thyromegaly Nose- normal nasaal mucosa, no maxillary sinus tenderness, no frontal sinus tenderness Mouth- normal mucus membrane, erythematous oropharynx Cardiovascular- normal s1,s2 Respiratory- poor air entry throughout with wheezing noted, no rhonchi or crackles  Musculoskeletal- able to move all 4 extremities, has a cane Neurological- no focal deficit Skin-  warm and dry Psychiatry- alert and oriented to person, place and time, normal mood and affect  Assessment/plan  1. COPD exacerbation With new cough and increased amount of phlegm along with dyspnea and lung exam finding, I will treat her for copd exacerbation with avelox 400 mg daily for 10 days and prednisone 50 mg daily for 5 days. Continue her bronchodilator treatment with duoneb for now.   2. Acute bronchitis, unspecified organism avelox should take care of this. If no improvement clinically in next 2-3 days, will need chest xray to rule out infiltrates and other pulmonary process. To take mucinex bid for now to help loosen her phlegm  Advised to return sooner if no improvement

## 2014-03-13 ENCOUNTER — Telehealth: Payer: Self-pay | Admitting: *Deleted

## 2014-03-13 DIAGNOSIS — J811 Chronic pulmonary edema: Secondary | ICD-10-CM

## 2014-03-13 DIAGNOSIS — R0989 Other specified symptoms and signs involving the circulatory and respiratory systems: Secondary | ICD-10-CM

## 2014-03-13 NOTE — Telephone Encounter (Signed)
Patient called and stated that she has finished the antibiotic but or ST and Congestion are worse. Please Advise.

## 2014-03-17 ENCOUNTER — Other Ambulatory Visit: Payer: Self-pay | Admitting: *Deleted

## 2014-03-17 DIAGNOSIS — M069 Rheumatoid arthritis, unspecified: Secondary | ICD-10-CM

## 2014-03-17 DIAGNOSIS — G9332 Myalgic encephalomyelitis/chronic fatigue syndrome: Secondary | ICD-10-CM

## 2014-03-17 DIAGNOSIS — G8929 Other chronic pain: Secondary | ICD-10-CM

## 2014-03-17 DIAGNOSIS — M545 Low back pain: Secondary | ICD-10-CM

## 2014-03-17 DIAGNOSIS — R5382 Chronic fatigue, unspecified: Principal | ICD-10-CM

## 2014-03-17 DIAGNOSIS — M797 Fibromyalgia: Secondary | ICD-10-CM

## 2014-03-17 MED ORDER — OXYCODONE HCL 5 MG PO TABS: ORAL_TABLET | ORAL | Status: DC

## 2014-03-17 MED ORDER — HYDROCODONE-ACETAMINOPHEN 10-325 MG PO TABS: ORAL_TABLET | ORAL | Status: DC

## 2014-03-17 NOTE — Telephone Encounter (Signed)
Let's get a chest xray to evaluate further and make appointment for her to see Dr Mariea Clonts. If no opening, any provider should be fine- sooner the better

## 2014-03-17 NOTE — Telephone Encounter (Signed)
Patient requested 

## 2014-03-17 NOTE — Telephone Encounter (Signed)
Patient Notified and order placed for a chest X-Ray. Patient will go tomorrow to have done. Scheduled an appointment for Wednesday with Dr. Nyoka Cowden to evaluated

## 2014-03-18 ENCOUNTER — Ambulatory Visit
Admission: RE | Admit: 2014-03-18 | Discharge: 2014-03-18 | Disposition: A | Discharge status: Home / Self Care | Payer: Medicare Other | Source: Ambulatory Visit | Attending: Internal Medicine | Admitting: Internal Medicine

## 2014-03-18 DIAGNOSIS — R0989 Other specified symptoms and signs involving the circulatory and respiratory systems: Secondary | ICD-10-CM | POA: Diagnosis not present

## 2014-03-18 DIAGNOSIS — Z79899 Other long term (current) drug therapy: Secondary | ICD-10-CM | POA: Diagnosis not present

## 2014-03-19 ENCOUNTER — Ambulatory Visit (INDEPENDENT_AMBULATORY_CARE_PROVIDER_SITE_OTHER): Payer: Medicare Other | Admitting: Internal Medicine

## 2014-03-19 ENCOUNTER — Encounter: Payer: Self-pay | Admitting: Internal Medicine

## 2014-03-19 VITALS — BP 164/70 | HR 114 | Temp 98.2°F | Wt 283.6 lb

## 2014-03-19 DIAGNOSIS — J449 Chronic obstructive pulmonary disease, unspecified: Secondary | ICD-10-CM

## 2014-03-19 DIAGNOSIS — R0602 Shortness of breath: Secondary | ICD-10-CM | POA: Diagnosis not present

## 2014-03-19 DIAGNOSIS — R609 Edema, unspecified: Secondary | ICD-10-CM | POA: Diagnosis not present

## 2014-03-19 DIAGNOSIS — I10 Essential (primary) hypertension: Secondary | ICD-10-CM

## 2014-03-19 DIAGNOSIS — E119 Type 2 diabetes mellitus without complications: Secondary | ICD-10-CM

## 2014-03-19 DIAGNOSIS — J4489 Other specified chronic obstructive pulmonary disease: Secondary | ICD-10-CM | POA: Diagnosis not present

## 2014-03-19 DIAGNOSIS — IMO0001 Reserved for inherently not codable concepts without codable children: Secondary | ICD-10-CM

## 2014-03-19 MED ORDER — FLUTICASONE FUROATE-VILANTEROL 100-25 MCG/INH IN AEPB: INHALATION_SPRAY | RESPIRATORY_TRACT | Status: DC

## 2014-03-19 NOTE — Progress Notes (Signed)
Patient ID: Katherine Delgado, female   DOB: 05-18-1941, 73 y.o.   MRN: [id number]    Location:     [facility]  Place of Service:  OFFICE    Allergies  Allergen Reactions  . Penicillins     Reaction not known  . Sulfamethoxazole-Trimethoprim     Reaction not known    Chief Complaint  Patient presents with  . Acute Visit    still having congestion since last OV on 7/15; had chest x-ray on 7/28 (in system)  . Immunizations    declines Tdap & Pneumo    HPI:  COPD: continues with cough and rattling chest congestion. No fever. No color to sputum. Denies night sweats.  DIABETES MELLITUS: running high glucose since she was put on prednisone at the last visit  DYSPNEA: mild  Edema: increased since on prednisone  FIBROMYALGIA: unchanged  HYPERTENSION: high SBP    Medications: Patient's Medications  New Prescriptions   No medications on file  Previous Medications   ASPIRIN EC 81 MG TABLET    Take 81 mg by mouth daily as needed.    BECLOMETHASONE (QVAR) 80 MCG/ACT INHALER    Inhale 1 puff into the lungs 2 (two) times daily as needed. For wheezing   CHOLECALCIFEROL (VITAMIN D-3) 5000 UNITS TABS    Take by mouth daily. 1 by mouth daily   DULOXETINE (CYMBALTA) 60 MG CAPSULE    Take 1 capsule (60 mg total) by mouth 2 (two) times daily.   FOLIC ACID (FOLVITE) 315 MCG TABLET    Take 400 mcg by mouth 2 (two) times daily as needed.    FUROSEMIDE (LASIX) 20 MG TABLET    Take 1 tablet (20 mg total) by mouth 2 (two) times daily as needed. Use as needed for 3 pounds gain in one day or 5 pounds in one week   GABAPENTIN (NEURONTIN) 600 MG TABLET    Take 1 tablet (600 mg total) by mouth 3 (three) times daily.   GLUCOSE BLOOD (ACCU-CHEK AVIVA) TEST STRIP    Use as instructed to check glucose 3 to 4 times daily to control blood sugar. 250.00   GUAIFENESIN (MUCINEX) 600 MG 12 HR TABLET    Take 1 tablet (600 mg total) by mouth 2 (two) times daily as needed for cough or to loosen phlegm.   HYDROCODONE-ACETAMINOPHEN (NORCO) 10-325 MG PER TABLET    Take one tablet every three hours as needed for pain   INSULIN ASPART (NOVOLOG FLEXPEN) 100 UNIT/ML INJECTION    Inject 20 Units into the skin 3 (three) times daily before meals.   INSULIN GLARGINE (LANTUS) 100 UNIT/ML INJECTION    Inject 80 units daily.  Split into 2 injections of 40 units to improve absorption.   INSULIN PEN NEEDLE 31G X 5 MM MISC    Use with Insulin pen as instructed. Dx 250.00   IPRATROPIUM-ALBUTEROL (DUONEB) 0.5-2.5 (3) MG/3ML SOLN    Take 3 mLs by nebulization. Use every 6 hours as needed for wheezing and SOB   LEVOTHYROXINE (SYNTHROID, LEVOTHROID) 175 MCG TABLET    Take 1 tablet (175 mcg total) by mouth daily before breakfast.   LIDOCAINE (LIDODERM) 5 %    Place 1 patch onto the skin as needed.   LIRAGLUTIDE (VICTOZA) 18 MG/3ML SOPN    Inject 1.8 mg into the skin daily.   LISINOPRIL (PRINIVIL,ZESTRIL) 5 MG TABLET    Take 1 tablet (5 mg total) by mouth daily.   NYSTATIN ([place]) 100000 UNIT/GM POWD  Apply 1 g topically daily as needed (for itching).    NYSTATIN-TRIAMCINOLONE (MYCOLOG II) CREAM    Apply topically 4 (four) times daily. Apply under abdomen folds twice daily until rash healed   OMEGA-3 ACID ETHYL ESTERS (LOVAZA) 1 G CAPSULE    Take 1 capsule (1 g total) by mouth daily.   OXYCODONE (OXY IR/ROXICODONE) 5 MG IMMEDIATE RELEASE TABLET    Take one tablet every 6 hours as needed for severe pain, Take 2 tablets at bedtime.   PANTOPRAZOLE (PROTONIX) 40 MG TABLET    TAKE 1 TABLET BY MOUTH EVERY DAY   PILOCARPINE (SALAGEN) 5 MG TABLET    Take 5 mg by mouth daily.   POLYVINYL ALCOHOL (LIQUIFILM TEARS) 1.4 % OPHTHALMIC SOLUTION    Place 1 drop into both eyes as needed (for dry eyes).    POTASSIUM CHLORIDE (MICRO-K) 10 MEQ CR CAPSULE    Take 1 capsule (10 mEq total) by mouth 2 (two) times daily.   PROMETHAZINE (PHENERGAN) 12.5 MG TABLET    TAKE 1 TABLET BY MOUTH AS NEEDED FOR SEVERE NAUSEA AS DIRECTED   SACCHAROMYCES  BOULARDII (FLORASTOR) 250 MG CAPSULE    Take 1 capsule (250 mg total) by mouth 2 (two) times daily.   SPIRIVA HANDIHALER 18 MCG INHALATION CAPSULE    PLACE 1 CAPSULE INTO INHALER AND INHALE DAILY   TRAZODONE (DESYREL) 50 MG TABLET    Take 0.5-1 tablets (25-50 mg total) by mouth at bedtime as needed for sleep.   TRIAMTERENE-HYDROCHLOROTHIAZIDE (MAXZIDE-25) 37.5-25 MG PER TABLET    TAKE 1 TABLET BY MOUTH ONCE DAILY TO CONTROL BLOOD PRESSURE   XELJANZ 5 MG TABS    Take 5 mg by mouth daily. For Rheumatoid Arthritis  Modified Medications   No medications on file  Discontinued Medications   MOXIFLOXACIN (AVELOX) 400 MG TABLET    Take 1 tablet (400 mg total) by mouth daily.   PREDNISONE (DELTASONE) 50 MG TABLET    Take one tablet daily for 5 days for copd and then stop     Review of Systems  Constitutional: Positive for fatigue. Negative for fever and chills.  HENT: Negative for congestion, ear pain, hearing loss, mouth sores, rhinorrhea, sinus pressure and sore throat.   Eyes: Negative for pain.  Respiratory: Positive for cough (sputum +) and shortness of breath. Negative for wheezing.   Cardiovascular: Negative for chest pain, palpitations and leg swelling.  Gastrointestinal: Negative for nausea, vomiting, abdominal pain, diarrhea, constipation and abdominal distention.  Endocrine: Negative for cold intolerance, heat intolerance, polydipsia, polyphagia and polyuria.  Genitourinary: Negative for dysuria, frequency and flank pain.  Musculoskeletal: Positive for arthralgias, back pain, myalgias and neck pain.  Skin: Negative.   Neurological: Positive for headaches.  Hematological: Negative.   Psychiatric/Behavioral: Positive for sleep disturbance. The patient is nervous/anxious.        Depressed    Filed Vitals:   03/19/14 1500  BP: 164/70  Pulse: 114  Temp: 98.2 F (36.8 C)  TempSrc: Oral  Weight: 283 lb 9.6 oz (128.64 kg)  SpO2: 97%   Body mass index is 43.13 kg/(m^2).  Physical  Exam  Constitutional: She is oriented to person, place, and time. She appears well-developed and well-nourished.  HENT:  Head: Normocephalic.  Right Ear: External ear normal.  Left Ear: External ear normal.  Nose: Nose normal.  Mouth/Throat: Oropharynx is clear and moist. No oropharyngeal exudate.  Eyes: Conjunctivae and EOM are normal. Pupils are equal, round, and reactive to light.  Neck:  No JVD present. No tracheal deviation present. No thyromegaly present.  Cardiovascular: Normal rate, regular rhythm, normal heart sounds and intact distal pulses.  Exam reveals no gallop and no friction rub.   No murmur heard. Pulmonary/Chest: No respiratory distress. She has no wheezes. She has rales. She exhibits no tenderness.  Abdominal: Bowel sounds are normal. She exhibits no distension and no mass. There is no tenderness.  Musculoskeletal: Normal range of motion. She exhibits tenderness. She exhibits no edema.  Lymphadenopathy:    She has no cervical adenopathy.  Neurological: She is alert and oriented to person, place, and time. No cranial nerve deficit. Coordination normal.  Tender in the left frontal and temple area.  Skin: No rash noted. No erythema. No pallor.  Psychiatric: She has a normal mood and affect. Her behavior is normal. Thought content normal.     Labs reviewed: Office Visit on 02/10/2014  Component Date Value Ref Range Status  . TSH 02/10/2014 3.070  0.450 - 4.500 uIU/mL Final  Office Visit on 01/16/2014  Component Date Value Ref Range Status  . WBC 01/16/2014 22.5* 3.4 - 10.8 x10E3/uL Final  . RBC 01/16/2014 5.52* 3.77 - 5.28 x10E6/uL Final  . Hemoglobin 01/16/2014 14.7  11.1 - 15.9 g/dL Final  . HCT 01/16/2014 45.5  34.0 - 46.6 % Final  . MCV 01/16/2014 82  79 - 97 fL Final  . MCH 01/16/2014 26.6  26.6 - 33.0 pg Final  . MCHC 01/16/2014 32.3  31.5 - 35.7 g/dL Final  . RDW 01/16/2014 14.3  12.3 - 15.4 % Final  . Platelets 01/16/2014 363  150 - 379 x10E3/uL Final  .  Neutrophils Relative % 01/16/2014 78   Final  . Lymphs 01/16/2014 14   Final  . Monocytes 01/16/2014 7   Final  . Eos 01/16/2014 1   Final  . Basos 01/16/2014 0   Final  . Neutrophils Absolute 01/16/2014 17.3* 1.4 - 7.0 x10E3/uL Final  . Lymphocytes Absolute 01/16/2014 3.1  0.7 - 3.1 x10E3/uL Final  . Monocytes Absolute 01/16/2014 1.6* 0.1 - 0.9 x10E3/uL Final  . Eosinophils Absolute 01/16/2014 0.3  0.0 - 0.4 x10E3/uL Final  . Basophils Absolute 01/16/2014 0.1  0.0 - 0.2 x10E3/uL Final  . Immature Granulocytes 01/16/2014 0   Final  . Immature Grans (Abs) 01/16/2014 0.0  0.0 - 0.1 x10E3/uL Final  . Glucose 01/16/2014 116* 65 - 99 mg/dL Final  . BUN 01/16/2014 21  8 - 27 mg/dL Final  . Creatinine, Ser 01/16/2014 1.09* 0.57 - 1.00 mg/dL Final  . GFR calc non Af Amer 01/16/2014 50* >59 mL/min/1.73 Final  . GFR calc Af Amer 01/16/2014 58* >59 mL/min/1.73 Final  . BUN/Creatinine Ratio 01/16/2014 19  11 - 26 Final  . Sodium 01/16/2014 142  134 - 144 mmol/L Final  . Potassium 01/16/2014 3.9  3.5 - 5.2 mmol/L Final  . Chloride 01/16/2014 99  97 - 108 mmol/L Final  . CO2 01/16/2014 26  18 - 29 mmol/L Final  . Calcium 01/16/2014 10.0  8.7 - 10.3 mg/dL Final  . Total Protein 01/16/2014 7.2  6.0 - 8.5 g/dL Final  . Albumin 01/16/2014 4.4  3.5 - 4.8 g/dL Final  . Globulin, Total 01/16/2014 2.8  1.5 - 4.5 g/dL Final  . Albumin/Globulin Ratio 01/16/2014 1.6  1.1 - 2.5 Final  . Total Bilirubin 01/16/2014 0.3  0.0 - 1.2 mg/dL Final  . Alkaline Phosphatase 01/16/2014 163* 39 - 117 IU/L Final  . AST 01/16/2014 25  0 - 40 IU/L Final  . ALT 01/16/2014 19  0 - 32 IU/L Final  . Hemoglobin A1C 01/16/2014 9.3* 4.8 - 5.6 % Final   Comment:          Increased risk for diabetes: 5.7 - 6.4                                   Diabetes: >6.4                                   Glycemic control for adults with diabetes: <7.0  . Estimated average glucose 01/16/2014 220   Final  . TSH 01/16/2014 0.114* 0.450 - 4.500  uIU/mL Final      Assessment/Plan  1. COPD Continue current meds, except hold  QVAR - Fluticasone Furoate-Vilanterol (BREO ELLIPTA) 100-25 MCG/INH AEPB; Inhale into the lungs once daily to help breathing  Dispense: 1 each; Refill: 4  2. DIABETES MELLITUS Should improve with time off prednisone  3. DYSPNEA mild  4. Edema mild  5. FIBROMYALGIA unchanged  6. HYPERTENSION Continue current medications

## 2014-03-19 NOTE — Patient Instructions (Signed)
Hold Qvar while using [employer]

## 2014-03-22 ENCOUNTER — Other Ambulatory Visit: Payer: Self-pay | Admitting: Neurology

## 2014-03-26 ENCOUNTER — Telehealth: Payer: Self-pay

## 2014-03-26 NOTE — Telephone Encounter (Signed)
Why won't we just do the prior authorization?  I don't understand why she wouldn't want [place] to do that.

## 2014-03-26 NOTE — Telephone Encounter (Signed)
Faxed received from [facility] indicating prior authorization needed for Lidoderm 5 % patch. Message states patient's insurance plan does not cover this medication. Please contact [phone].   I called the patient to inform her that her plan no longer covers this medication. I informed patient of the PA process. Patient would prefer alternative vs initiating PA.  Dr.Reed please advise on alternative to Lidoderm/Lidocaine 5 % patch.

## 2014-03-26 NOTE — Telephone Encounter (Signed)
There is no safe alternative to lidoderm patch.  It is in its own class of meds.  Has she been using it with benefit?

## 2014-03-26 NOTE — Telephone Encounter (Signed)
Spoke with patient, patient states she was taking this for knee pain and it was helping manage pain well. Patient will try to do without medication for now and call if knee pain is unbearable

## 2014-03-27 ENCOUNTER — Other Ambulatory Visit: Payer: Self-pay | Admitting: Internal Medicine

## 2014-04-14 ENCOUNTER — Other Ambulatory Visit: Payer: Self-pay | Admitting: *Deleted

## 2014-04-14 DIAGNOSIS — G8929 Other chronic pain: Secondary | ICD-10-CM

## 2014-04-14 DIAGNOSIS — M069 Rheumatoid arthritis, unspecified: Secondary | ICD-10-CM

## 2014-04-14 DIAGNOSIS — G9332 Myalgic encephalomyelitis/chronic fatigue syndrome: Secondary | ICD-10-CM

## 2014-04-14 DIAGNOSIS — M545 Low back pain: Secondary | ICD-10-CM

## 2014-04-14 DIAGNOSIS — M797 Fibromyalgia: Secondary | ICD-10-CM

## 2014-04-14 DIAGNOSIS — R5382 Chronic fatigue, unspecified: Principal | ICD-10-CM

## 2014-04-14 MED ORDER — OXYCODONE HCL 5 MG PO TABS: ORAL_TABLET | ORAL | Status: DC

## 2014-04-14 MED ORDER — HYDROCODONE-ACETAMINOPHEN 10-325 MG PO TABS: ORAL_TABLET | ORAL | Status: DC

## 2014-04-14 NOTE — Telephone Encounter (Signed)
Patient Requested 

## 2014-04-18 ENCOUNTER — Ambulatory Visit: Payer: Medicare Other | Admitting: Internal Medicine

## 2014-04-21 ENCOUNTER — Ambulatory Visit (INDEPENDENT_AMBULATORY_CARE_PROVIDER_SITE_OTHER): Payer: Medicare Other | Admitting: Internal Medicine

## 2014-04-21 ENCOUNTER — Encounter: Payer: Self-pay | Admitting: Internal Medicine

## 2014-04-21 DIAGNOSIS — J449 Chronic obstructive pulmonary disease, unspecified: Secondary | ICD-10-CM | POA: Diagnosis not present

## 2014-04-21 DIAGNOSIS — J4489 Other specified chronic obstructive pulmonary disease: Secondary | ICD-10-CM

## 2014-04-21 DIAGNOSIS — E039 Hypothyroidism, unspecified: Secondary | ICD-10-CM

## 2014-04-21 DIAGNOSIS — E119 Type 2 diabetes mellitus without complications: Secondary | ICD-10-CM | POA: Diagnosis not present

## 2014-04-21 DIAGNOSIS — M949 Disorder of cartilage, unspecified: Secondary | ICD-10-CM

## 2014-04-21 DIAGNOSIS — M899 Disorder of bone, unspecified: Secondary | ICD-10-CM

## 2014-04-21 DIAGNOSIS — Z1239 Encounter for other screening for malignant neoplasm of breast: Secondary | ICD-10-CM

## 2014-04-21 DIAGNOSIS — M8580 Other specified disorders of bone density and structure, unspecified site: Secondary | ICD-10-CM

## 2014-04-21 MED ORDER — LEVOTHYROXINE SODIUM 175 MCG PO TABS: ORAL_TABLET | ORAL | Status: DC

## 2014-04-21 NOTE — Progress Notes (Signed)
Patient ID: Katherine Delgado, female   DOB: 1941/03/14, 73 y.o.   MRN: [id number]   Location:  [facility]  Code Status: full code, MOST done last time   Allergies  Allergen Reactions  . Penicillins     Reaction not known  . Sulfamethoxazole-Trimethoprim     Reaction not known    Chief Complaint  Patient presents with  . Medical Management of Chronic Issues    HPI: Patient is a 73 y.o. white female seen in the office today for med mgt chronic diseases.  She  had her mammogram and bone density done.    Her blood pressure is elevated but it is normal at home.    She cannot get rid of her bronchitis--slimy, greenish whatever, short lived.  Sometimes sounds like she has laryngitis.  Going on since 7/15.  Took abx a few days, didn't really get better.  Came back end of July, was probably a virus, but still going on.  Has her chronic bronchitis/copd.  Has Qvar, breo ellipta and spiriva.      Can't really exercise, walk and wants to lose weight.  Also not helped with need for steroid shots and tapers.  Help only a short period of time, meanwhile affecting glucose.  Thinks she could make better food choices.  Could use more frozen and fresh veggies than canned.  Does not eat three regular meals a day.  Probably eats two meals most of the time.  Does not drink a lot of water either.  Does drink it with mio ok--max 3 bottled waters per day.  Drinks diet green tea.  Also drinks skim milk.  1-2 cups of coffee in the morning.  Does not drink soda.  Says she does not overeat in terms of portion control.  Is not a big eater.  Not particularly big on fried or sweet necessarily.  Usually bakes chicken.  Doesn't eat a lot of red meat.    Review of Systems:  Review of Systems  Constitutional: Positive for malaise/fatigue. Negative for fever and chills.  HENT: Negative for congestion and hearing loss.   Eyes: Negative for blurred vision.  Respiratory: Positive for  cough, sputum production and shortness of breath. Negative for wheezing.   Cardiovascular: Negative for chest pain, palpitations and leg swelling.  Gastrointestinal: Negative for abdominal pain, constipation, blood in stool and melena.  Genitourinary: Negative for dysuria, urgency and frequency.  Musculoskeletal: Positive for back pain, joint pain and myalgias. Negative for falls.  Skin: Negative for rash.  Neurological: Negative for dizziness, loss of consciousness and headaches.  Endo/Heme/Allergies: Bruises/bleeds easily.  Psychiatric/Behavioral: Positive for depression. Negative for memory loss. The patient is nervous/anxious and has insomnia.     Past Medical History  Diagnosis Date  . COPD (chronic obstructive pulmonary disease)   . Hypertension   . RA (rheumatoid arthritis)   . Thyroid disease   . Miscarriage 1962  . History of benign thymus tumor   . Back injury   . GERD (gastroesophageal reflux disease)   . Arthritis   . Fibromyalgia   . Diabetes mellitus without complication   . Thyroid disorder   . Acute infective polyneuritis 2002  . Guillain-Barre syndrome   . Polyneuropathy in diabetes(357.2)   . Morbid obesity   . Spondylosis, lumbosacral   . Degenerative arthritis   . Dyslipidemia   . Spinal stenosis, lumbar region, without neurogenic claudication   . Candidiasis of skin and nails   .  Acute maxillary sinusitis   . Insomnia, unspecified   . Candidiasis of vulva and vagina   . Edema   . Acute sinusitis, unspecified   . Obstructive chronic bronchitis with acute bronchitis   . Urinary tract infection, site not specified   . Unspecified hypothyroidism   . Type II or unspecified type diabetes mellitus with peripheral circulatory disorders, uncontrolled(250.72)   . Mixed hyperlipidemia   . Other specified disease of white blood cells   . Depressive disorder, not elsewhere classified   . Chronic pain syndrome   . Tear film insufficiency, unspecified   .  Unspecified essential hypertension   . Allergic rhinitis due to pollen   . Unspecified chronic bronchitis   . Reflux esophagitis   . Diaphragmatic hernia without mention of obstruction or gangrene   . Unspecified pruritic disorder   . Rheumatoid arthritis(714.0)   . Pain in joint, multiple sites   . Stiffness of joints, not elsewhere classified, multiple sites   . Lumbago   . Other malaise and fatigue   . Spontaneous ecchymoses   . Abnormal weight gain   . Shortness of breath   . Cough   . Unspecified urinary incontinence   . Osteopenia, senile     Past Surgical History  Procedure Laterality Date  . Abdominal hysterectomy  1974  . Tonsillectomy    . Cholecystectomy  1984  . Back surgery  1982  . Ovarian cyst surgery  1968  . Thymus tumor    . Knee surgery Bilateral 08/27/2010 (L) and 01/11/2011 (R)  . [place]  . Bronchoscopy  2001  . Abdominal tumor  2002  . Thymus tumor  10/2000    Social History:   reports that she quit smoking about 34 years ago. Her smoking use included Cigarettes. She smoked 0.00 packs per day. She does not have any smokeless tobacco history on file. She reports that she does not drink alcohol or use illicit drugs.  Family History  Problem Relation Age of Onset  . Alzheimer's disease Mother   . Heart disease Mother   . Heart disease Father   . Liver disease Father   . Cancer Sister     Medications: Patient's Medications  New Prescriptions   No medications on file  Previous Medications   ASPIRIN EC 81 MG TABLET    Take 81 mg by mouth daily as needed.    BECLOMETHASONE (QVAR) 80 MCG/ACT INHALER    Inhale 1 puff into the lungs 2 (two) times daily as needed. For wheezing   CHOLECALCIFEROL (VITAMIN D-3) 5000 UNITS TABS    Take by mouth daily. 1 by mouth daily   DULOXETINE (CYMBALTA) 60 MG CAPSULE    Take 1 capsule (60 mg total) by mouth 2 (two) times daily.   FLUTICASONE FUROATE-VILANTEROL (BREO ELLIPTA) 100-25 MCG/INH AEPB    Inhale  into the lungs once daily to help breathing   FOLIC ACID (FOLVITE) 998 MCG TABLET    Take 400 mcg by mouth 2 (two) times daily as needed.    FUROSEMIDE (LASIX) 20 MG TABLET    Take 1 tablet (20 mg total) by mouth 2 (two) times daily as needed. Use as needed for 3 pounds gain in one day or 5 pounds in one week   GABAPENTIN (NEURONTIN) 600 MG TABLET    TAKE 1 TABLET BY MOUTH THREE TIMES DAILY   GLUCOSE BLOOD (ACCU-CHEK AVIVA) TEST STRIP    Use as instructed to check glucose 3 to 4  times daily to control blood sugar. 250.00   GUAIFENESIN (MUCINEX) 600 MG 12 HR TABLET    Take 1 tablet (600 mg total) by mouth 2 (two) times daily as needed for cough or to loosen phlegm.   HYDROCODONE-ACETAMINOPHEN (NORCO) 10-325 MG PER TABLET    Take one tablet every three hours as needed for pain   INSULIN ASPART (NOVOLOG FLEXPEN) 100 UNIT/ML INJECTION    Inject 20 Units into the skin 3 (three) times daily before meals.   INSULIN GLARGINE (LANTUS) 100 UNIT/ML INJECTION    Inject 80 units daily.  Split into 2 injections of 40 units to improve absorption.   INSULIN PEN NEEDLE 31G X 5 MM MISC    Use with Insulin pen as instructed. Dx 250.00   IPRATROPIUM-ALBUTEROL (DUONEB) 0.5-2.5 (3) MG/3ML SOLN    Take 3 mLs by nebulization. Use every 6 hours as needed for wheezing and SOB   LEVOTHYROXINE (SYNTHROID, LEVOTHROID) 175 MCG TABLET    TAKE 1 TABLET BY MOUTH DAILY BEFORE BREAKFAST   LIDOCAINE (LIDODERM) 5 %    Place 1 patch onto the skin as needed.   LIRAGLUTIDE (VICTOZA) 18 MG/3ML SOPN    Inject 1.8 mg into the skin daily.   LISINOPRIL (PRINIVIL,ZESTRIL) 5 MG TABLET    Take 1 tablet (5 mg total) by mouth daily.   NYSTATIN (NYAMYC) 100000 UNIT/GM POWD    Apply 1 g topically daily as needed (for itching).    NYSTATIN-TRIAMCINOLONE (MYCOLOG II) CREAM    Apply topically 4 (four) times daily. Apply under abdomen folds twice daily until rash healed   OMEGA-3 ACID ETHYL ESTERS (LOVAZA) 1 G CAPSULE    Take 1 capsule (1 g total) by  mouth daily.   OXYCODONE (OXY IR/ROXICODONE) 5 MG IMMEDIATE RELEASE TABLET    Take one tablet every 6 hours as needed for severe pain, Take 2 tablets at bedtime.   PANTOPRAZOLE (PROTONIX) 40 MG TABLET    TAKE 1 TABLET BY MOUTH EVERY DAY   PILOCARPINE (SALAGEN) 5 MG TABLET    Take 5 mg by mouth daily.   POLYVINYL ALCOHOL (LIQUIFILM TEARS) 1.4 % OPHTHALMIC SOLUTION    Place 1 drop into both eyes as needed (for dry eyes).    POTASSIUM CHLORIDE (MICRO-K) 10 MEQ CR CAPSULE    Take 1 capsule (10 mEq total) by mouth 2 (two) times daily.   PROMETHAZINE (PHENERGAN) 12.5 MG TABLET    TAKE 1 TABLET BY MOUTH AS NEEDED FOR SEVERE NAUSEA AS DIRECTED   SPIRIVA HANDIHALER 18 MCG INHALATION CAPSULE    PLACE 1 CAPSULE INTO INHALER AND INHALE DAILY   TRAZODONE (DESYREL) 50 MG TABLET    Take 0.5-1 tablets (25-50 mg total) by mouth at bedtime as needed for sleep.   TRIAMTERENE-HYDROCHLOROTHIAZIDE (MAXZIDE-25) 37.5-25 MG PER TABLET    TAKE 1 TABLET BY MOUTH ONCE DAILY TO CONTROL BLOOD PRESSURE   XELJANZ 5 MG TABS    Take 5 mg by mouth daily. For Rheumatoid Arthritis  Modified Medications   No medications on file  Discontinued Medications   SACCHAROMYCES BOULARDII (FLORASTOR) 250 MG CAPSULE    Take 1 capsule (250 mg total) by mouth 2 (two) times daily.     Physical Exam: Filed Vitals:   04/21/14 1331  BP: 170/68  Pulse: 107  Temp: 97.4 F (36.3 C)  TempSrc: Oral  Resp: 20  Height: 5\' 8"  (1.727 m)  Weight: 280 lb 12.8 oz (127.37 kg)  SpO2: 93%  Physical Exam  Constitutional: She  is oriented to person, place, and time.  Obese white female seated in chair, walks with walker  HENT:  Head: Normocephalic and atraumatic.  Cardiovascular: Normal rate, regular rhythm, normal heart sounds and intact distal pulses.   Pulmonary/Chest: Effort normal and breath sounds normal. She has no wheezes.  Abdominal: Soft. Bowel sounds are normal. She exhibits no distension and no mass. There is no tenderness.    Musculoskeletal: Normal range of motion. She exhibits tenderness.  Of back muscles, low back, hips  Neurological: She is alert and oriented to person, place, and time.  Skin: Skin is warm and dry.  Psychiatric:  Frustrated with her health today; seems to want me to fix her problems and unhappy that I cannot do this due to their chronicity and interactivity    Labs reviewed: Basic Metabolic Panel:  Recent Labs  09/10/13 0844 12/18/13 0805 01/16/14 1405 02/10/14 1043  NA 140 142 142  --   K 3.8 4.0 3.9  --   CL 96* 97 99  --   CO2 26 28 26   --   GLUCOSE 147* 125* 116*  --   BUN 18 18 21   --   CREATININE 0.93 1.04* 1.09*  --   CALCIUM 9.6 9.7 10.0  --   TSH  --  0.223* 0.114* 3.070   Liver Function Tests:  Recent Labs  12/18/13 0805 01/16/14 1405  AST 37 25  ALT 24 19  ALKPHOS 124* 163*  BILITOT 0.4 0.3  PROT 6.7 7.2   No results found for this basename: LIPASE, AMYLASE,  in the last 8760 hours No results found for this basename: AMMONIA,  in the last 8760 hours CBC:  Recent Labs  09/10/13 0844 12/18/13 0805 01/16/14 1405  WBC 13.7* 13.6* 22.5*  NEUTROABS 10.3* 9.9* 17.3*  HGB 14.4 14.2 14.7  HCT 45.6 44.2 45.5  MCV 82 83 82  PLT  --   --  363   Lipid Panel:  Recent Labs  09/10/13 0844 12/18/13 0805  HDL 32* 30*  LDLCALC 96 76  TRIG 158* 163*  CHOLHDL 5.0* 4.6*   Lab Results  Component Value Date   HGBA1C 9.3* 01/16/2014   Assessment/Plan 1. Severe obesity (BMI >= 40) -we had an extensive discussion about dietary changes--she tells me how she needs to lose weight, but then insists she is already doing everything right in terms of her eating when asked including proper choices and portion control--admits to not eating 3 regular meals per day; however, but does not seem apt to change this either -wants to exercise, but continues to c/o pain in her back and hips and knees despite her current pain regimen and consults with orthopedics and  rheumatology -encouraged walking for exercise anyway for joint lubrication and I've suggested water aerobics many times to her  2. Unspecified hypothyroidism -her last TSH in June was finally normal so medication renewed - levothyroxine (SYNTHROID, LEVOTHROID) 175 MCG tablet; TAKE 1 TABLET BY MOUTH DAILY BEFORE BREAKFAST  Dispense: 30 tablet; Refill: 3 -will need f/u at next appt  3. Type II or unspecified type diabetes mellitus without mention of complication, not stated as uncontrolled -remains on lantus 80 units daily and novolog 20 units tid before meals plus victoza 1.8mg  sq daily -she has actually not had any recent steroid injections and is not currently on prednisone for any reason  4. Obstructive chronic bronchitis without exacerbation -discussed that her chronic cough and mucus production are truly chronic (she seemed unsure about  this for some reason) -did not seem to have any s/s of acute exacerbation today--no change in sputum amt, quality or her coughing, afebrile -I wonder if she really is using her inhalers as directed--she seems to think they are ineffective (I have explained that they do not all create an immediate relief of symptoms but function over time)  5. Breast cancer screening -had normal mammogram 09/16/13  6. Osteopenia, senile -had her bone density 09/16/13 also I'm told, but I do not have a copy of it scanned in media--will need to call [facility] for this next time (only mammo scanned)  Labs/tests ordered: Orders Placed This Encounter  Procedures  . Hemoglobin A1c  . Basic metabolic panel    Next appt:  3 mos

## 2014-04-21 NOTE — Patient Instructions (Signed)
Please bring me your bp log from home  I recommend you do a living will and health care power of attorney and bring [place] a copy

## 2014-04-22 LAB — BASIC METABOLIC PANEL
BUN/Creatinine Ratio: 17 (ref 11–26)
BUN: 16 mg/dL (ref 8–27)
CO2: 26 mmol/L (ref 18–29)
Calcium: 9.5 mg/dL (ref 8.7–10.3)
Chloride: 99 mmol/L (ref 97–108)
Creatinine, Ser: 0.96 mg/dL (ref 0.57–1.00)
GFR calc Af Amer: 68 mL/min/{1.73_m2} (ref >59)
GFR calc non Af Amer: 59 mL/min/{1.73_m2} — ABNORMAL LOW (ref >59)
Glucose: 161 mg/dL — ABNORMAL HIGH (ref 65–99)
Potassium: 4 mmol/L (ref 3.5–5.2)
Sodium: 141 mmol/L (ref 134–144)

## 2014-04-22 LAB — HEMOGLOBIN A1C
Est. average glucose Bld gHb Est-mCnc: 229 mg/dL
Hgb A1c MFr Bld: 9.6 % — ABNORMAL HIGH (ref 4.8–5.6)

## 2014-04-23 ENCOUNTER — Telehealth: Payer: Self-pay | Admitting: *Deleted

## 2014-04-23 NOTE — Telephone Encounter (Signed)
Spoke with patient regarding lab results, informed her that Dr. Mariea Clonts request that she see Cathey sooner than 9/28. She moved her appointment to 9/14 @11 :15am.

## 2014-04-23 NOTE — Telephone Encounter (Signed)
Message copied by Eilene Ghazi on Wed Apr 23, 2014 11:54 AM ------      Message from: Daniel, Rexene Edison      Created: Tue Apr 22, 2014  7:53 PM       Sugar average has gotten even worse.  Is it possible to move up her appt with Cathey? ------

## 2014-05-05 ENCOUNTER — Encounter: Payer: Self-pay | Admitting: Pharmacotherapy

## 2014-05-05 ENCOUNTER — Ambulatory Visit (INDEPENDENT_AMBULATORY_CARE_PROVIDER_SITE_OTHER): Payer: Medicare Other | Admitting: Pharmacotherapy

## 2014-05-05 VITALS — BP 152/78 | HR 106 | Temp 98.1°F | Ht 65.5 in | Wt 280.0 lb

## 2014-05-05 DIAGNOSIS — E119 Type 2 diabetes mellitus without complications: Secondary | ICD-10-CM | POA: Diagnosis not present

## 2014-05-05 DIAGNOSIS — I10 Essential (primary) hypertension: Secondary | ICD-10-CM | POA: Diagnosis not present

## 2014-05-05 DIAGNOSIS — E1159 Type 2 diabetes mellitus with other circulatory complications: Secondary | ICD-10-CM

## 2014-05-05 MED ORDER — INSULIN GLARGINE 100 UNIT/ML ~~LOC~~ SOLN: SUBCUTANEOUS | Status: DC

## 2014-05-05 MED ORDER — LISINOPRIL 5 MG PO TABS: 5.0000 mg | ORAL_TABLET | Freq: Two times a day (BID) | ORAL | Status: DC

## 2014-05-05 NOTE — Patient Instructions (Signed)
1.  Increase Lantus 90 units (45 units in two injections) daily. 2.  Increase Lisinopril 5mg  twice daily. 3.  Check into the [facility] for exercise ideas. 4.  Stop skipping meals.

## 2014-05-05 NOTE — Progress Notes (Signed)
  Subjective:    Katherine Delgado is a 73 y.o.white female who presents for follow-up of Type 2 diabetes mellitus.   A1C essentially unchanged.  Too high.  She forgot to bring blood glucose meter.  Her blood glucose meter is old - a [employer] She reports "running high"  She says her BG 130-160.  Her A1C is indicative of average BG:  250 or more. She says her lowest BG was 74m/dl.  She struggles with healthy food choices.  Often skips meals and snacks  She is taking Novolog 20 units with each meal. Lantus 80 units daily. No routine exercise.  Limited due to back, hip, leg, knee pain. Nocturia 2-3 times per night. No peripheral edema.  Her BP is also high at home.  Review of Systems A comprehensive review of systems was negative except for: Ears, nose, mouth, throat, and face: positive for nasal congestion Respiratory: positive for chronic bronchitis, cough and sputum Genitourinary: positive for nocturia Musculoskeletal: positive for arthralgias, back pain and stiff joints    Objective:    BP 152/78  Pulse 106  Temp(Src) 98.1 F (36.7 C) (Oral)  Ht 5' 5.5" (1.664 m)  Wt 280 lb (127.007 kg)  BMI 45.87 kg/m2  SpO2 90%  General:  alert, cooperative, appears stated age and no distress  Oropharynx: normal findings: lips normal without lesions and gums healthy   Eyes:  negative findings: lids and lashes normal and conjunctivae and sclerae normal   Ears:  external ears normal        Lung: clear to auscultation bilaterally  Heart:  regular rate and rhythm     Extremities: no edema noted  Skin: dry     Neuro: mental status, speech normal, alert and oriented x3 and requires cane to assist in ambulation   Lab Review Glucose (mg/dL)  Date Value  04/21/2014 161*  01/16/2014 116*  12/18/2013 125*     Glucose, Bld (mg/dL)  Date Value  10/20/2011 180*     CO2 (mmol/L)  Date Value  04/21/2014 26   01/16/2014 26   12/18/2013 28      BUN (mg/dL)  Date Value  04/21/2014 16    01/16/2014 21   12/18/2013 18   10/20/2011 28*     Creatinine, Ser (mg/dL)  Date Value  04/21/2014 0.96   01/16/2014 1.09*  12/18/2013 1.04*    04/21/14: A1C:  9.6%  Assessment:    Diabetes Mellitus type II, under poor control. A1C above goal <7% BP above goal <140/90   Plan:    1.  Rx changes: 1.  increase Lantus 90 units daily.  2.  increase Lisinopril 553mtwice daily 2.  Continue Novolog 20 units with meals. 3.  Continue Victoza 1.50m47maily. 4.  Discussed weight loss medications.  Needs to improve diet and exercise habits. 5.  Counseled on nutrition goals.  Explained why she needs to eat 3 balanced meals per day. 6.  Counseled on need for routine exercise.  Goal is 30-45 minutes 5 x week.  Recommended she consider the [facility].  Increase Lisinopril 5mg35mice daily.

## 2014-05-12 ENCOUNTER — Encounter: Payer: Self-pay | Admitting: Internal Medicine

## 2014-05-14 ENCOUNTER — Other Ambulatory Visit: Payer: Self-pay | Admitting: *Deleted

## 2014-05-14 DIAGNOSIS — G9332 Myalgic encephalomyelitis/chronic fatigue syndrome: Secondary | ICD-10-CM

## 2014-05-14 DIAGNOSIS — R5382 Chronic fatigue, unspecified: Principal | ICD-10-CM

## 2014-05-14 DIAGNOSIS — G8929 Other chronic pain: Secondary | ICD-10-CM

## 2014-05-14 DIAGNOSIS — M797 Fibromyalgia: Secondary | ICD-10-CM

## 2014-05-14 DIAGNOSIS — M545 Low back pain: Secondary | ICD-10-CM

## 2014-05-14 DIAGNOSIS — M069 Rheumatoid arthritis, unspecified: Secondary | ICD-10-CM

## 2014-05-14 MED ORDER — OXYCODONE HCL 5 MG PO TABS: ORAL_TABLET | ORAL | Status: DC

## 2014-05-14 MED ORDER — HYDROCODONE-ACETAMINOPHEN 10-325 MG PO TABS: ORAL_TABLET | ORAL | Status: DC

## 2014-05-14 NOTE — Telephone Encounter (Signed)
Patient Requested 

## 2014-05-19 ENCOUNTER — Ambulatory Visit: Payer: Medicare Other | Admitting: Pharmacotherapy

## 2014-06-11 ENCOUNTER — Other Ambulatory Visit: Payer: Self-pay | Admitting: *Deleted

## 2014-06-11 DIAGNOSIS — G9332 Myalgic encephalomyelitis/chronic fatigue syndrome: Secondary | ICD-10-CM

## 2014-06-11 DIAGNOSIS — M797 Fibromyalgia: Secondary | ICD-10-CM | POA: Diagnosis not present

## 2014-06-11 DIAGNOSIS — R5382 Chronic fatigue, unspecified: Principal | ICD-10-CM

## 2014-06-11 DIAGNOSIS — M069 Rheumatoid arthritis, unspecified: Secondary | ICD-10-CM

## 2014-06-11 DIAGNOSIS — M545 Low back pain: Secondary | ICD-10-CM

## 2014-06-11 DIAGNOSIS — Z79899 Other long term (current) drug therapy: Secondary | ICD-10-CM | POA: Diagnosis not present

## 2014-06-11 DIAGNOSIS — G8929 Other chronic pain: Secondary | ICD-10-CM

## 2014-06-11 DIAGNOSIS — M179 Osteoarthritis of knee, unspecified: Secondary | ICD-10-CM | POA: Diagnosis not present

## 2014-06-11 DIAGNOSIS — M3500 Sicca syndrome, unspecified: Secondary | ICD-10-CM | POA: Diagnosis not present

## 2014-06-11 MED ORDER — HYDROCODONE-ACETAMINOPHEN 10-325 MG PO TABS: ORAL_TABLET | ORAL | Status: DC

## 2014-06-11 MED ORDER — OXYCODONE HCL 5 MG PO TABS: ORAL_TABLET | ORAL | Status: DC

## 2014-06-11 NOTE — Telephone Encounter (Signed)
Patient Requested and will pick up 

## 2014-06-12 ENCOUNTER — Other Ambulatory Visit: Payer: Self-pay | Admitting: Internal Medicine

## 2014-06-26 ENCOUNTER — Other Ambulatory Visit: Payer: Self-pay | Admitting: Internal Medicine

## 2014-07-01 ENCOUNTER — Other Ambulatory Visit: Payer: Self-pay | Admitting: *Deleted

## 2014-07-01 DIAGNOSIS — I10 Essential (primary) hypertension: Secondary | ICD-10-CM

## 2014-07-01 MED ORDER — LISINOPRIL 5 MG PO TABS: ORAL_TABLET | ORAL | Status: DC

## 2014-07-01 NOTE — Telephone Encounter (Signed)
[facility] [address] 

## 2014-07-10 ENCOUNTER — Other Ambulatory Visit: Payer: Self-pay | Admitting: *Deleted

## 2014-07-10 DIAGNOSIS — G9332 Myalgic encephalomyelitis/chronic fatigue syndrome: Secondary | ICD-10-CM

## 2014-07-10 DIAGNOSIS — R5382 Chronic fatigue, unspecified: Principal | ICD-10-CM

## 2014-07-10 DIAGNOSIS — G8929 Other chronic pain: Secondary | ICD-10-CM

## 2014-07-10 DIAGNOSIS — E119 Type 2 diabetes mellitus without complications: Secondary | ICD-10-CM

## 2014-07-10 DIAGNOSIS — M797 Fibromyalgia: Secondary | ICD-10-CM

## 2014-07-10 DIAGNOSIS — M069 Rheumatoid arthritis, unspecified: Secondary | ICD-10-CM

## 2014-07-10 DIAGNOSIS — M545 Low back pain: Secondary | ICD-10-CM

## 2014-07-10 MED ORDER — LIRAGLUTIDE 18 MG/3ML ~~LOC~~ SOPN: 1.8000 mg | PEN_INJECTOR | Freq: Every day | SUBCUTANEOUS | Status: DC

## 2014-07-10 MED ORDER — OXYCODONE HCL 5 MG PO TABS: ORAL_TABLET | ORAL | Status: DC

## 2014-07-10 MED ORDER — HYDROCODONE-ACETAMINOPHEN 10-325 MG PO TABS: ORAL_TABLET | ORAL | Status: DC

## 2014-07-10 NOTE — Telephone Encounter (Signed)
Patient Requested and will pick up. Victoza faxed to pharmacy.

## 2014-07-24 ENCOUNTER — Ambulatory Visit (INDEPENDENT_AMBULATORY_CARE_PROVIDER_SITE_OTHER): Payer: Medicare Other | Admitting: Internal Medicine

## 2014-07-24 ENCOUNTER — Other Ambulatory Visit: Payer: Self-pay | Admitting: Internal Medicine

## 2014-07-24 ENCOUNTER — Other Ambulatory Visit: Payer: Self-pay | Admitting: Neurology

## 2014-07-24 ENCOUNTER — Encounter: Payer: Self-pay | Admitting: Internal Medicine

## 2014-07-24 VITALS — BP 142/68 | HR 98 | Temp 98.7°F | Ht 66.0 in | Wt 276.0 lb

## 2014-07-24 DIAGNOSIS — M1611 Unilateral primary osteoarthritis, right hip: Secondary | ICD-10-CM

## 2014-07-24 DIAGNOSIS — M545 Low back pain, unspecified: Secondary | ICD-10-CM

## 2014-07-24 DIAGNOSIS — R5382 Chronic fatigue, unspecified: Secondary | ICD-10-CM | POA: Diagnosis not present

## 2014-07-24 DIAGNOSIS — E038 Other specified hypothyroidism: Secondary | ICD-10-CM

## 2014-07-24 DIAGNOSIS — G8929 Other chronic pain: Secondary | ICD-10-CM

## 2014-07-24 DIAGNOSIS — M797 Fibromyalgia: Secondary | ICD-10-CM

## 2014-07-24 DIAGNOSIS — J4489 Other specified chronic obstructive pulmonary disease: Secondary | ICD-10-CM

## 2014-07-24 DIAGNOSIS — J449 Chronic obstructive pulmonary disease, unspecified: Secondary | ICD-10-CM

## 2014-07-24 DIAGNOSIS — G4700 Insomnia, unspecified: Secondary | ICD-10-CM

## 2014-07-24 DIAGNOSIS — G9332 Myalgic encephalomyelitis/chronic fatigue syndrome: Secondary | ICD-10-CM

## 2014-07-24 MED ORDER — NYAMYC 100000 UNIT/GM EX POWD: 1.0000 g | Freq: Every day | CUTANEOUS | Status: DC | PRN

## 2014-07-24 MED ORDER — MELATONIN 5 MG PO CAPS: 1.0000 | ORAL_CAPSULE | Freq: Every evening | ORAL | Status: DC | PRN

## 2014-07-24 MED ORDER — NYSTATIN-TRIAMCINOLONE 100000-0.1 UNIT/GM-% EX CREA: TOPICAL_CREAM | Freq: Four times a day (QID) | CUTANEOUS | Status: DC

## 2014-07-24 MED ORDER — PROMETHAZINE HCL 12.5 MG PO TABS: ORAL_TABLET | ORAL | Status: DC

## 2014-07-24 NOTE — Progress Notes (Signed)
Patient ID: Katherine Delgado, female   DOB: 28-May-1941, 73 y.o.   MRN: [id number]   Location:  [facility] / [facility]  Allergies  Allergen Reactions  . Penicillins     Reaction not known  . Sulfamethoxazole-Trimethoprim     Reaction not known    Chief Complaint  Patient presents with  . Medical Management of Chronic Issues    3 month follow-up, labs completed 04/2014     HPI: Patient is a 73 y.o. seen in the office today for med mgt of chronic diseases.  Hanging in there as usual.  Is walking with only her cane today for stability and to help her walking.  No new things to speak of--same old aches and pains with RA, fibromyalgia. Get worse.  Pain medicine takes the edge off.  Has trouble sleeping when wakes up with pain.  Restless legs sometimes waken her, too.    Still having trouble with her sugars.  Sees Cathey mid month.  Has labs next week.  Insurance changing her diabetes medications.  No longer going to cover qvar.  Still has the qvar.  Will cover flovent instead next year.  Had to fight for her Morrie Sheldon.    Asked about her urine drug screen when she got the bill on it.    C/o hip xrays not really being followed up after she saw Dr. Jannifer Franklin.  I have reviewed the report.  It shows osteoarthritis with loss of joint space on the right.    Occasional cough and dyspnea, but pretty stable there.    Not sleeping well at night.    Review of Systems:  Review of Systems  Constitutional: Positive for malaise/fatigue. Negative for fever.  HENT: Negative for congestion and hearing loss.   Eyes: Negative for blurred vision.  Respiratory: Positive for cough and shortness of breath. Negative for sputum production and wheezing.   Cardiovascular: Negative for chest pain and leg swelling.  Gastrointestinal: Positive for constipation. Negative for abdominal pain, blood in stool and melena.  Genitourinary: Negative for dysuria.  Musculoskeletal: Positive for  myalgias, back pain and joint pain. Negative for falls.  Skin: Negative for rash.  Neurological: Negative for dizziness and loss of consciousness.  Endo/Heme/Allergies: Bruises/bleeds easily.  Psychiatric/Behavioral: Negative for memory loss.     Past Medical History  Diagnosis Date  . COPD (chronic obstructive pulmonary disease)   . Hypertension   . RA (rheumatoid arthritis)   . Thyroid disease   . Miscarriage 1962  . History of benign thymus tumor   . Back injury   . GERD (gastroesophageal reflux disease)   . Arthritis   . Fibromyalgia   . Diabetes mellitus without complication   . Thyroid disorder   . Acute infective polyneuritis 2002  . Guillain-Barre syndrome   . Polyneuropathy in diabetes(357.2)   . Morbid obesity   . Spondylosis, lumbosacral   . Degenerative arthritis   . Dyslipidemia   . Spinal stenosis, lumbar region, without neurogenic claudication   . Candidiasis of skin and nails   . Acute maxillary sinusitis   . Insomnia, unspecified   . Candidiasis of vulva and vagina   . Edema   . Acute sinusitis, unspecified   . Obstructive chronic bronchitis with acute bronchitis   . Urinary tract infection, site not specified   . Unspecified hypothyroidism   . Type II or unspecified type diabetes mellitus with peripheral circulatory disorders, uncontrolled(250.72)   . Mixed hyperlipidemia   . Other  specified disease of white blood cells   . Depressive disorder, not elsewhere classified   . Chronic pain syndrome   . Tear film insufficiency, unspecified   . Unspecified essential hypertension   . Allergic rhinitis due to pollen   . Unspecified chronic bronchitis   . Reflux esophagitis   . Diaphragmatic hernia without mention of obstruction or gangrene   . Unspecified pruritic disorder   . Rheumatoid arthritis(714.0)   . Pain in joint, multiple sites   . Stiffness of joints, not elsewhere classified, multiple sites   . Lumbago   . Other malaise and fatigue   .  Spontaneous ecchymoses   . Abnormal weight gain   . Shortness of breath   . Cough   . Unspecified urinary incontinence   . Osteopenia, senile     Past Surgical History  Procedure Laterality Date  . Abdominal hysterectomy  1974  . Tonsillectomy    . Cholecystectomy  1984  . Back surgery  1982  . Ovarian cyst surgery  1968  . Thymus tumor    . Knee surgery Bilateral 08/27/2010 (L) and 01/11/2011 (R)  . [place]  . Bronchoscopy  2001  . Abdominal tumor  2002  . Thymus tumor  10/2000    Social History:   reports that she quit smoking about 34 years ago. Her smoking use included Cigarettes. She smoked 0.00 packs per day. She does not have any smokeless tobacco history on file. She reports that she does not drink alcohol or use illicit drugs.  Family History  Problem Relation Age of Onset  . Alzheimer's disease Mother   . Heart disease Mother   . Heart disease Father   . Liver disease Father   . Cancer Sister     Medications: Patient's Medications  New Prescriptions   No medications on file  Previous Medications   ASPIRIN EC 81 MG TABLET    Take 81 mg by mouth daily as needed.    BECLOMETHASONE (QVAR) 80 MCG/ACT INHALER    Inhale 1 puff into the lungs 2 (two) times daily as needed. For wheezing   CHOLECALCIFEROL (VITAMIN D-3) 5000 UNITS TABS    Take by mouth daily. 1 by mouth daily   DULOXETINE (CYMBALTA) 60 MG CAPSULE    Take 1 capsule (60 mg total) by mouth 2 (two) times daily.   FOLIC ACID (FOLVITE) 094 MCG TABLET    Take 400 mcg by mouth 2 (two) times daily as needed.    FUROSEMIDE (LASIX) 20 MG TABLET    Take 1 tablet (20 mg total) by mouth 2 (two) times daily as needed. Use as needed for 3 pounds gain in one day or 5 pounds in one week   GABAPENTIN (NEURONTIN) 600 MG TABLET    TAKE 1 TABLET BY MOUTH THREE TIMES DAILY   GLUCOSE BLOOD (ACCU-CHEK AVIVA) TEST STRIP    Use as instructed to check glucose 3 to 4 times daily to control blood sugar. 250.00   GUAIFENESIN  (MUCINEX) 600 MG 12 HR TABLET    Take 1 tablet (600 mg total) by mouth 2 (two) times daily as needed for cough or to loosen phlegm.   HYDROCODONE-ACETAMINOPHEN (NORCO) 10-325 MG PER TABLET    Take one tablet every three hours as needed for pain   INSULIN ASPART (NOVOLOG FLEXPEN) 100 UNIT/ML INJECTION    Inject 20 Units into the skin 3 (three) times daily before meals.   INSULIN GLARGINE (LANTUS) 100 UNIT/ML INJECTION  Inject 90 units daily.  Split into 2 injections of 45 units to improve absorption.   INSULIN PEN NEEDLE 31G X 5 MM MISC    Use with Insulin pen as instructed. Dx 250.00   LEVOTHYROXINE (SYNTHROID, LEVOTHROID) 175 MCG TABLET    TAKE 1 TABLET BY MOUTH DAILY BEFORE BREAKFAST   LIDOCAINE (LIDODERM) 5 %    Place 1 patch onto the skin as needed.   LIRAGLUTIDE (VICTOZA) 18 MG/3ML SOPN    Inject 1.8 mg into the skin daily. To control blood sugar   LISINOPRIL (PRINIVIL,ZESTRIL) 5 MG TABLET    Take one tablet by mouth twice daily to control blood pressure   NYSTATIN (NYAMYC) 100000 UNIT/GM POWD    Apply 1 g topically daily as needed (for itching).    NYSTATIN-TRIAMCINOLONE (MYCOLOG II) CREAM    Apply topically 4 (four) times daily. Apply under abdomen folds twice daily until rash healed   OXYCODONE (OXY IR/ROXICODONE) 5 MG IMMEDIATE RELEASE TABLET    Take one tablet every 6 hours as needed for severe pain, Take 2 tablets at bedtime.   PANTOPRAZOLE (PROTONIX) 40 MG TABLET    TAKE 1 TABLET BY MOUTH DAILY   PILOCARPINE (SALAGEN) 5 MG TABLET    Take 5 mg by mouth daily.   POLYVINYL ALCOHOL (LIQUIFILM TEARS) 1.4 % OPHTHALMIC SOLUTION    Place 1 drop into both eyes as needed (for dry eyes).    POTASSIUM CHLORIDE (MICRO-K) 10 MEQ CR CAPSULE    Take 1 capsule (10 mEq total) by mouth 2 (two) times daily.   PROMETHAZINE (PHENERGAN) 12.5 MG TABLET    TAKE 1 TABLET BY MOUTH AS NEEDED FOR SEVERE NAUSEA AS DIRECTED   SPIRIVA HANDIHALER 18 MCG INHALATION CAPSULE    PLACE 1 CAPSULE INTO INHALER AND INHALE  DAILY   TRAZODONE (DESYREL) 50 MG TABLET    Take 0.5-1 tablets (25-50 mg total) by mouth at bedtime as needed for sleep.   XELJANZ 5 MG TABS    Take 5 mg by mouth daily. For Rheumatoid Arthritis  Modified Medications   Modified Medication Previous Medication   OMEGA-3 ACID ETHYL ESTERS (LOVAZA) 1 G CAPSULE omega-3 acid ethyl esters (LOVAZA) 1 G capsule      TAKE 1 CAPSULE BY MOUTH EVERY DAY    TAKE ONE CAPSULE BY MOUTH EVERY DAY   TRIAMTERENE-HYDROCHLOROTHIAZIDE (MAXZIDE-25) 37.5-25 MG PER TABLET triamterene-hydrochlorothiazide (MAXZIDE-25) 37.5-25 MG per tablet      TAKE 1 TABLET BY MOUTH DAILY TO CONTROL BLOOD PRESSURE    TAKE 1 TABLET BY MOUTH ONCE DAILY TO CONTROL BLOOD PRESSURE  Discontinued Medications   FLUTICASONE FUROATE-VILANTEROL (BREO ELLIPTA) 100-25 MCG/INH AEPB    Inhale into the lungs once daily to help breathing   IPRATROPIUM-ALBUTEROL (DUONEB) 0.5-2.5 (3) MG/3ML SOLN    Take 3 mLs by nebulization. Use every 6 hours as needed for wheezing and SOB     Physical Exam: Filed Vitals:   07/24/14 1105  BP: 142/68  Pulse: 98  Temp: 98.7 F (37.1 C)  TempSrc: Oral  Height: 5\' 6"  (1.676 m)  Weight: 276 lb (125.193 kg)  SpO2: 99%  Physical Exam  Constitutional: She is oriented to person, place, and time.  Obese white female walks with cane  Cardiovascular: Normal rate, regular rhythm and normal heart sounds.   Pulmonary/Chest: Effort normal and breath sounds normal. No respiratory distress.  Abdominal: Soft. Bowel sounds are normal.  Musculoskeletal:  Walks with cane to steady her; right hip tenderness  Neurological: She  is alert and oriented to person, place, and time.  Psychiatric: She has a normal mood and affect.   Labs reviewed: Basic Metabolic Panel:  Recent Labs  12/18/13 0805 01/16/14 1405 02/10/14 1043 04/21/14 1419  NA 142 142  --  141  K 4.0 3.9  --  4.0  CL 97 99  --  99  CO2 28 26  --  26  GLUCOSE 125* 116*  --  161*  BUN 18 21  --  16  CREATININE  1.04* 1.09*  --  0.96  CALCIUM 9.7 10.0  --  9.5  TSH 0.223* 0.114* 3.070  --    Liver Function Tests:  Recent Labs  12/18/13 0805 01/16/14 1405  AST 37 25  ALT 24 19  ALKPHOS 124* 163*  BILITOT 0.4 0.3  PROT 6.7 7.2   No results for input(s): LIPASE, AMYLASE in the last 8760 hours. No results for input(s): AMMONIA in the last 8760 hours. CBC:  Recent Labs  09/10/13 0844 12/18/13 0805 01/16/14 1405  WBC 13.7* 13.6* 22.5*  NEUTROABS 10.3* 9.9* 17.3*  HGB 14.4 14.2 14.7  HCT 45.6 44.2 45.5  MCV 82 83 82  PLT  --   --  363   Lipid Panel:  Recent Labs  09/10/13 0844 12/18/13 0805  HDL 32* 30*  LDLCALC 96 76  TRIG 158* 163*  CHOLHDL 5.0* 4.6*   Lab Results  Component Value Date   HGBA1C 9.6* 04/21/2014   Assessment/Plan 1. Insomnia - has been longstanding--some is pain related, some restless legs syndrome - Melatonin 5 MG CAPS; Take 1 capsule (5 mg total) by mouth at bedtime as needed (insomnia).  Dispense: 30 capsule; Refill: 3  2. Primary osteoarthritis of right hip -discussed that she has lost joint space and the only true solution to this is surgery, but she does not want this  3. Chronic fatigue fibromyalgia syndrome -longstanding, does not exercise or make suggested changes  4. Obstructive chronic bronchitis without exacerbation -no recent exacerbations -cont regular inhalers   5. Chronic low back pain -cont current pain regimen -RA is managed by Dr. Kathee Delton  6. Other specified hypothyroidism -stable on current dose of synthroid  Next appt:  3 mos  Monea Pesantez L. Kelita Wallis, D.O. [facility] [address], [place] [address] Cell Phone (Mon-Fri 8am-5pm):  [phone] On Call:  [phone] & follow prompts after 5pm & weekends Office Phone:  [phone] Office Fax:  [phone]

## 2014-07-24 NOTE — Patient Instructions (Signed)
Try melatonin 5mg  capsule one hours before bedtime for 3 nights, go up to 2 capsules if still not sleeping or not well enough.  If you don't get relief with this, give me a call and we can resume trazodone.

## 2014-07-31 ENCOUNTER — Other Ambulatory Visit: Payer: Medicare Other

## 2014-08-01 ENCOUNTER — Other Ambulatory Visit: Payer: Medicare Other

## 2014-08-01 DIAGNOSIS — E1159 Type 2 diabetes mellitus with other circulatory complications: Secondary | ICD-10-CM | POA: Diagnosis not present

## 2014-08-02 ENCOUNTER — Other Ambulatory Visit: Payer: Self-pay | Admitting: Internal Medicine

## 2014-08-02 LAB — MICROALBUMIN / CREATININE URINE RATIO
Creatinine, Ur: 251.1 mg/dL (ref 15.0–278.0)
MICROALB/CREAT RATIO: 22.7 mg/g creat (ref 0.0–30.0)
Microalbumin, Urine: 56.9 ug/mL — ABNORMAL HIGH (ref 0.0–17.0)

## 2014-08-02 LAB — COMPREHENSIVE METABOLIC PANEL
ALT: 28 IU/L (ref 0–32)
AST: 39 IU/L (ref 0–40)
Albumin/Globulin Ratio: 1.4 (ref 1.1–2.5)
Albumin: 3.9 g/dL (ref 3.5–4.8)
Alkaline Phosphatase: 140 IU/L — ABNORMAL HIGH (ref 39–117)
BUN/Creatinine Ratio: 15 (ref 11–26)
BUN: 16 mg/dL (ref 8–27)
CO2: 25 mmol/L (ref 18–29)
Calcium: 9.5 mg/dL (ref 8.7–10.3)
Chloride: 98 mmol/L (ref 97–108)
Creatinine, Ser: 1.06 mg/dL — ABNORMAL HIGH (ref 0.57–1.00)
GFR calc Af Amer: 60 mL/min/{1.73_m2} (ref >59)
GFR calc non Af Amer: 52 mL/min/{1.73_m2} — ABNORMAL LOW (ref >59)
Globulin, Total: 2.7 g/dL (ref 1.5–4.5)
Glucose: 128 mg/dL — ABNORMAL HIGH (ref 65–99)
Potassium: 4.4 mmol/L (ref 3.5–5.2)
Sodium: 141 mmol/L (ref 134–144)
Total Bilirubin: 0.3 mg/dL (ref 0.0–1.2)
Total Protein: 6.6 g/dL (ref 6.0–8.5)

## 2014-08-02 LAB — HEMOGLOBIN A1C
Est. average glucose Bld gHb Est-mCnc: 203 mg/dL
Hgb A1c MFr Bld: 8.7 % — ABNORMAL HIGH (ref 4.8–5.6)

## 2014-08-04 ENCOUNTER — Ambulatory Visit (INDEPENDENT_AMBULATORY_CARE_PROVIDER_SITE_OTHER): Payer: Medicare Other | Admitting: Pharmacotherapy

## 2014-08-04 ENCOUNTER — Encounter: Payer: Self-pay | Admitting: Pharmacotherapy

## 2014-08-04 ENCOUNTER — Other Ambulatory Visit: Payer: Self-pay

## 2014-08-04 VITALS — BP 134/78 | HR 88 | Temp 98.0°F | Resp 12 | Ht 66.0 in | Wt 278.0 lb

## 2014-08-04 DIAGNOSIS — I10 Essential (primary) hypertension: Secondary | ICD-10-CM

## 2014-08-04 DIAGNOSIS — Z79899 Other long term (current) drug therapy: Secondary | ICD-10-CM | POA: Diagnosis not present

## 2014-08-04 DIAGNOSIS — E119 Type 2 diabetes mellitus without complications: Secondary | ICD-10-CM | POA: Diagnosis not present

## 2014-08-04 MED ORDER — INSULIN SYRINGE-NEEDLE U-100 31G X 5/16" 1 ML MISC
Status: DC
Start: 2014-08-04 — End: 2017-02-20

## 2014-08-04 MED ORDER — INSULIN LISPRO 200 UNIT/ML ~~LOC~~ SOPN: 20.0000 [IU] | PEN_INJECTOR | Freq: Three times a day (TID) | SUBCUTANEOUS | Status: DC

## 2014-08-04 MED ORDER — INSULIN PEN NEEDLE 31G X 5 MM MISC: Status: DC

## 2014-08-04 MED ORDER — GLUCOSE BLOOD VI STRP: ORAL_STRIP | Status: DC

## 2014-08-04 NOTE — Progress Notes (Signed)
  Subjective:    Katherine Delgado is a 73 y.o.white female who presents for follow-up of Type 2 diabetes mellitus.   A1C now at 8.7% (was 9.6%) Currently on Novolog and Lantus. She is doing 80 units of Lantus. She is doing 20 units of Novolog when she eats. Continues Victoza 1.22m daily.  Average BG:  2311mdl No hypoglycemia.  Trying to make healthy food choices.  She is skipping meals. No routine exercise. Has peripheral edema. Wears corrective lenses. Complaining of joint and back pain.  Worse now. Denies problems with feet. Nocturia at least 2-3 times per night.  Insurance changing from NoHallo Humalog in January.  Using melatonin to help with sleep.  She does think it helps.  Review of Systems A comprehensive review of systems was negative except for: Constitutional: positive for fatigue Eyes: positive for contacts/glasses Cardiovascular: positive for lower extremity edema Genitourinary: positive for nocturia Musculoskeletal: positive for back pain and myalgias    Objective:    BP 134/78 mmHg  Pulse 88  Temp(Src) 98 F (36.7 C) (Oral)  Resp 12  Ht 5' 6"$  (1.676 m)  Wt 278 lb (126.1 kg)  BMI 44.89 kg/m2  SpO2 93%  General:  alert, cooperative and no distress  Oropharynx: normal findings: lips normal without lesions and gums healthy   Eyes:  negative findings: lids and lashes normal and conjunctivae and sclerae normal   Ears:  external ears normal        Lung: clear to auscultation bilaterally  Heart:  regular rate and rhythm     Extremities: edema bilateral lower and upper extremities  Skin: warm and dry, no hyperpigmentation, vitiligo, or suspicious lesions     Neuro: mental status, speech normal, alert and oriented x3 and gait and station normal   Lab Review GLUCOSE (mg/dL)  Date Value  08/01/2014 128*  04/21/2014 161*  01/16/2014 116*   GLUCOSE, BLD (mg/dL)  Date Value  10/20/2011 180*   CO2 (mmol/L)  Date Value  08/01/2014 25  04/21/2014  26  01/16/2014 26   BUN (mg/dL)  Date Value  08/01/2014 16  04/21/2014 16  01/16/2014 21  10/20/2011 28*   CREATININE, SER (mg/dL)  Date Value  08/01/2014 1.06*  04/21/2014 0.96  01/16/2014 1.09*     08/01/14: A1C:  8.7%  Microalbumin:  56.9  Assessment:    Diabetes Mellitus type II, under fair control. A1C better. BP at goal <140/90   Plan:    1.  Rx changes: increase Lantus to 90 units daily. 2.  Change Novolog to Humalog U-200 20 units with meals (insurance formulary change). 3.  Continue Victoza 1.75m12maily. 4.  Provided AccuChek Nano BGM. 5.  Counseled on nutrition goals. 6.  Counseled on benefit of routine exercise.  Goal is 30-45 minutes 5 x week. 7.  Positive microlabuminuria - on lisinopril. 8.  HTN at goal.

## 2014-08-04 NOTE — Patient Instructions (Signed)
Increase Lantus to 90 units daily (split as 2 injections of 45 units)

## 2014-08-08 ENCOUNTER — Other Ambulatory Visit: Payer: Self-pay | Admitting: *Deleted

## 2014-08-08 DIAGNOSIS — M069 Rheumatoid arthritis, unspecified: Secondary | ICD-10-CM

## 2014-08-08 DIAGNOSIS — G9332 Myalgic encephalomyelitis/chronic fatigue syndrome: Secondary | ICD-10-CM

## 2014-08-08 DIAGNOSIS — M797 Fibromyalgia: Secondary | ICD-10-CM

## 2014-08-08 DIAGNOSIS — G8929 Other chronic pain: Secondary | ICD-10-CM

## 2014-08-08 DIAGNOSIS — M545 Low back pain: Secondary | ICD-10-CM

## 2014-08-08 DIAGNOSIS — R5382 Chronic fatigue, unspecified: Principal | ICD-10-CM

## 2014-08-08 MED ORDER — OXYCODONE HCL 5 MG PO TABS: ORAL_TABLET | ORAL | Status: DC

## 2014-08-08 MED ORDER — HYDROCODONE-ACETAMINOPHEN 10-325 MG PO TABS: ORAL_TABLET | ORAL | Status: DC

## 2014-08-08 NOTE — Telephone Encounter (Signed)
Patient requested and will pick up 

## 2014-08-19 ENCOUNTER — Other Ambulatory Visit: Payer: Self-pay | Admitting: Internal Medicine

## 2014-08-22 ENCOUNTER — Other Ambulatory Visit: Payer: Self-pay | Admitting: Internal Medicine

## 2014-08-25 ENCOUNTER — Other Ambulatory Visit: Payer: Self-pay | Admitting: *Deleted

## 2014-08-25 MED ORDER — TIOTROPIUM BROMIDE MONOHYDRATE 18 MCG IN CAPS: ORAL_CAPSULE | RESPIRATORY_TRACT | Status: DC

## 2014-08-25 NOTE — Telephone Encounter (Signed)
[place]

## 2014-09-03 ENCOUNTER — Other Ambulatory Visit: Payer: Self-pay | Admitting: Neurology

## 2014-09-04 ENCOUNTER — Other Ambulatory Visit: Payer: Self-pay | Admitting: *Deleted

## 2014-09-04 DIAGNOSIS — G8929 Other chronic pain: Secondary | ICD-10-CM

## 2014-09-04 DIAGNOSIS — M069 Rheumatoid arthritis, unspecified: Secondary | ICD-10-CM

## 2014-09-04 DIAGNOSIS — M797 Fibromyalgia: Secondary | ICD-10-CM

## 2014-09-04 DIAGNOSIS — M545 Low back pain: Secondary | ICD-10-CM

## 2014-09-04 DIAGNOSIS — G9332 Myalgic encephalomyelitis/chronic fatigue syndrome: Secondary | ICD-10-CM

## 2014-09-04 DIAGNOSIS — R5382 Chronic fatigue, unspecified: Principal | ICD-10-CM

## 2014-09-04 MED ORDER — HYDROCODONE-ACETAMINOPHEN 10-325 MG PO TABS: ORAL_TABLET | ORAL | Status: DC

## 2014-09-04 MED ORDER — OXYCODONE HCL 5 MG PO TABS: ORAL_TABLET | ORAL | Status: DC

## 2014-09-04 NOTE — Telephone Encounter (Signed)
Patient Requested and will pick up 

## 2014-09-11 ENCOUNTER — Telehealth: Payer: Self-pay | Admitting: *Deleted

## 2014-09-11 NOTE — Telephone Encounter (Signed)
Kim with [employer] called and stated that Insurance would not cover novolog so it was changed to Humalog, it was put in for Humalog 200u 20 units with meals, but pharmacy is questioning due to patient was on Novolog 100u. They want to know if it is suppose to be 100 instead of 200. (Cathy's OV note at bottom in Plan does state Humalog 200u 20 units with meals.) Please Advise.

## 2014-09-11 NOTE — Telephone Encounter (Signed)
Tried calling pharmacy 3 times and on hold over 9 minutes each time.

## 2014-09-11 NOTE — Telephone Encounter (Signed)
The prescription should be filled as written.

## 2014-09-16 NOTE — Telephone Encounter (Signed)
Pharmacy and Patient notified.

## 2014-10-03 ENCOUNTER — Telehealth: Payer: Self-pay | Admitting: *Deleted

## 2014-10-03 DIAGNOSIS — M545 Low back pain, unspecified: Secondary | ICD-10-CM

## 2014-10-03 DIAGNOSIS — G8929 Other chronic pain: Secondary | ICD-10-CM

## 2014-10-03 DIAGNOSIS — G9332 Myalgic encephalomyelitis/chronic fatigue syndrome: Secondary | ICD-10-CM

## 2014-10-03 DIAGNOSIS — M069 Rheumatoid arthritis, unspecified: Secondary | ICD-10-CM

## 2014-10-03 DIAGNOSIS — R5382 Chronic fatigue, unspecified: Secondary | ICD-10-CM

## 2014-10-03 DIAGNOSIS — M797 Fibromyalgia: Secondary | ICD-10-CM

## 2014-10-03 MED ORDER — OXYCODONE HCL 5 MG PO TABS: ORAL_TABLET | ORAL | Status: DC

## 2014-10-03 MED ORDER — HYDROCODONE-ACETAMINOPHEN 10-325 MG PO TABS: ORAL_TABLET | ORAL | Status: DC

## 2014-10-03 NOTE — Telephone Encounter (Signed)
Patient called for a refill of pain medication for pick-up.

## 2014-10-16 ENCOUNTER — Other Ambulatory Visit: Payer: Self-pay

## 2014-10-21 ENCOUNTER — Other Ambulatory Visit: Payer: Self-pay | Admitting: Neurology

## 2014-10-21 ENCOUNTER — Other Ambulatory Visit: Payer: Self-pay | Admitting: Internal Medicine

## 2014-10-23 ENCOUNTER — Ambulatory Visit (INDEPENDENT_AMBULATORY_CARE_PROVIDER_SITE_OTHER): Payer: Medicare Other | Admitting: Internal Medicine

## 2014-10-23 ENCOUNTER — Other Ambulatory Visit: Payer: Medicare Other

## 2014-10-23 ENCOUNTER — Encounter: Payer: Self-pay | Admitting: Internal Medicine

## 2014-10-23 VITALS — BP 158/64 | HR 105 | Temp 97.7°F | Resp 20 | Ht 66.0 in | Wt 272.4 lb

## 2014-10-23 DIAGNOSIS — R197 Diarrhea, unspecified: Secondary | ICD-10-CM

## 2014-10-23 DIAGNOSIS — R5382 Chronic fatigue, unspecified: Secondary | ICD-10-CM | POA: Diagnosis not present

## 2014-10-23 DIAGNOSIS — G4700 Insomnia, unspecified: Secondary | ICD-10-CM | POA: Diagnosis not present

## 2014-10-23 DIAGNOSIS — F458 Other somatoform disorders: Secondary | ICD-10-CM

## 2014-10-23 DIAGNOSIS — M797 Fibromyalgia: Secondary | ICD-10-CM

## 2014-10-23 DIAGNOSIS — I10 Essential (primary) hypertension: Secondary | ICD-10-CM

## 2014-10-23 DIAGNOSIS — G9332 Myalgic encephalomyelitis/chronic fatigue syndrome: Secondary | ICD-10-CM

## 2014-10-23 DIAGNOSIS — R0989 Other specified symptoms and signs involving the circulatory and respiratory systems: Secondary | ICD-10-CM

## 2014-10-23 DIAGNOSIS — J449 Chronic obstructive pulmonary disease, unspecified: Secondary | ICD-10-CM

## 2014-10-23 DIAGNOSIS — E119 Type 2 diabetes mellitus without complications: Secondary | ICD-10-CM

## 2014-10-23 MED ORDER — GABAPENTIN 600 MG PO TABS: ORAL_TABLET | ORAL | Status: DC

## 2014-10-23 NOTE — Progress Notes (Signed)
Patient ID: Katherine Delgado, female   DOB: [birth year], 74 y.o.   MRN: [id number]   Location:  [facility]   Allergies  Allergen Reactions  . Penicillins     Reaction not known  . Sulfamethoxazole-Trimethoprim     Reaction not known    Chief Complaint  Patient presents with  . Medical Management of Chronic Issues    medication ?,    HPI: Patient is a 74 y.o. female seen in the office today for followup of chronic medical problems.  Had a GI illness around [place], diarrhea, vomiting, nausea that resolved. However, she is continuing to have diarrhea, gas, and a rumbling in her stomach. Has had 3 watery movements in the last 24 hours. Denies blood or mucous. Some cramping before and during movements.  Has lost 6 pounds since last visit, not intentionally, but has been eating less due to GI distress. Has used immodium and kaopectate with relief, but the diarrhea continues to return.   Feels like she has something in her throat choking her sometimes. Is not there all the time. Notes choking when she eats at times, but not all the time. Has a hiatal hernia. Denies heartburn, nausea, cough. Noted in her chart she had an esophagus dilatation in 2006.   Not sleeping. Using the 5mg  melatonin. Falls asleep, then wakes back up. Tried trazadone a year ago. She does go to bed with the tv on .   Continues to be bothered by the same old stuff, RA, DM, fibromyalgia.   Breathing has been so-so. Has not been using spiriva everyday. Feels her breathing has been ok nonetheless. QVAR is no longer being covered by her insurance.   BP is elevated in the office. She has checked it at other places 136-140's/ 50-60's.  Not consistent with her aspirin daily.   Swelling in her legs comes and goes. Not having to take extra lasix very often.   Has had some episodes of tenderness in her mouth, and white coating on her tongue that have resolved. She is asking for a  prescription for Mycelex troche to use if this happens again in the future.   Review of Systems:  Review of Systems  Constitutional: Positive for weight loss and malaise/fatigue. Negative for fever and chills.  Eyes:       Glasses, sees opthmalogist  Respiratory: Negative for cough, shortness of breath and wheezing.   Cardiovascular: Positive for leg swelling. Negative for chest pain, palpitations and orthopnea.  Gastrointestinal: Positive for diarrhea. Negative for heartburn, nausea, vomiting, abdominal pain, blood in stool and melena.  Genitourinary: Negative for dysuria, urgency and frequency.  Musculoskeletal: Positive for myalgias and joint pain.  Psychiatric/Behavioral: Negative for depression and suicidal ideas.       Angry sometimes r/t chronic illness    Past Medical History  Diagnosis Date  . COPD (chronic obstructive pulmonary disease)   . Hypertension   . RA (rheumatoid arthritis)   . Thyroid disease   . Miscarriage 1962  . History of benign thymus tumor   . Back injury   . GERD (gastroesophageal reflux disease)   . Arthritis   . Fibromyalgia   . Diabetes mellitus without complication   . Thyroid disorder   . Acute infective polyneuritis 2002  . Guillain-Barre syndrome   . Polyneuropathy in diabetes(357.2)   . Morbid obesity   . Spondylosis, lumbosacral   . Degenerative arthritis   . Dyslipidemia   . Spinal  stenosis, lumbar region, without neurogenic claudication   . Candidiasis of skin and nails   . Acute maxillary sinusitis   . Insomnia, unspecified   . Candidiasis of vulva and vagina   . Edema   . Acute sinusitis, unspecified   . Obstructive chronic bronchitis with acute bronchitis   . Urinary tract infection, site not specified   . Unspecified hypothyroidism   . Type II or unspecified type diabetes mellitus with peripheral circulatory disorders, uncontrolled(250.72)   . Mixed hyperlipidemia   . Other specified disease of white blood cells   .  Depressive disorder, not elsewhere classified   . Chronic pain syndrome   . Tear film insufficiency, unspecified   . Unspecified essential hypertension   . Allergic rhinitis due to pollen   . Unspecified chronic bronchitis   . Reflux esophagitis   . Diaphragmatic hernia without mention of obstruction or gangrene   . Unspecified pruritic disorder   . Rheumatoid arthritis(714.0)   . Pain in joint, multiple sites   . Stiffness of joints, not elsewhere classified, multiple sites   . Lumbago   . Other malaise and fatigue   . Spontaneous ecchymoses   . Abnormal weight gain   . Shortness of breath   . Cough   . Unspecified urinary incontinence   . Osteopenia, senile     Past Surgical History  Procedure Laterality Date  . Abdominal hysterectomy  1974  . Tonsillectomy    . Cholecystectomy  1984  . Back surgery  1982  . Ovarian cyst surgery  1968  . Thymus tumor    . Knee surgery Bilateral 08/27/2010 (L) and 01/11/2011 (R)  . [place]  . Bronchoscopy  2001  . Abdominal tumor  2002  . Thymus tumor  10/2000    Social History:   reports that she quit smoking about 34 years ago. Her smoking use included Cigarettes. She does not have any smokeless tobacco history on file. She reports that she does not drink alcohol or use illicit drugs.  Family History  Problem Relation Age of Onset  . Alzheimer's disease Mother   . Heart disease Mother   . Heart disease Father   . Liver disease Father   . Cancer Sister     Medications: Patient's Medications  New Prescriptions   No medications on file  Previous Medications   ASPIRIN EC 81 MG TABLET    Take 81 mg by mouth daily as needed.    BECLOMETHASONE (QVAR) 80 MCG/ACT INHALER    Inhale 1 puff into the lungs 2 (two) times daily as needed. For wheezing   CHOLECALCIFEROL (VITAMIN D-3) 5000 UNITS TABS    Take by mouth daily. 1 by mouth daily   DULOXETINE (CYMBALTA) 60 MG CAPSULE    Take 1 capsule (60 mg total) by mouth 2 (two) times  daily.   FOLIC ACID (FOLVITE) 782 MCG TABLET    Take 400 mcg by mouth 2 (two) times daily as needed.    FUROSEMIDE (LASIX) 20 MG TABLET    Take 1 tablet (20 mg total) by mouth 2 (two) times daily as needed. Use as needed for 3 pounds gain in one day or 5 pounds in one week   GABAPENTIN (NEURONTIN) 600 MG TABLET    TAKE 1 TABLET BY MOUTH THREE TIMES DAILY; PLEASE SCHEDULE FOLLOW UP APPOINTMENT   GLUCOSE BLOOD (ACCU-CHEK SMARTVIEW) TEST STRIP    Use as instructed   GUAIFENESIN (MUCINEX) 600 MG 12 HR TABLET  Take 1 tablet (600 mg total) by mouth 2 (two) times daily as needed for cough or to loosen phlegm.   HYDROCODONE-ACETAMINOPHEN (NORCO) 10-325 MG PER TABLET    Take one tablet every three hours as needed for pain   INSULIN GLARGINE (LANTUS) 100 UNIT/ML INJECTION    Inject 90 units daily.  Split into 2 injections of 45 units to improve absorption.   INSULIN LISPRO, HUMAN, (HUMALOG KWIKPEN) 200 UNIT/ML SOPN    Inject 20 Units into the skin 3 (three) times daily with meals.   INSULIN PEN NEEDLE 31G X 5 MM MISC    Use with Insulin pen as instructed. Dx E11.59   INSULIN SYRINGE-NEEDLE U-100 31G X 5/16" 1 ML MISC    Use once daily with administration of Insulin DX E11.59   LEVOTHYROXINE (SYNTHROID, LEVOTHROID) 175 MCG TABLET    TAKE 1 TABLET BY MOUTH DAILY BEFORE BREAKFAST   LIDOCAINE (LIDODERM) 5 %    Place 1 patch onto the skin as needed.   LIRAGLUTIDE (VICTOZA) 18 MG/3ML SOPN    Inject 1.8 mg into the skin daily. To control blood sugar   LISINOPRIL (PRINIVIL,ZESTRIL) 5 MG TABLET    Take one tablet by mouth twice daily to control blood pressure   MELATONIN 5 MG CAPS    Take 1 capsule (5 mg total) by mouth at bedtime as needed (insomnia).   NYSTATIN (NYAMYC) 100000 UNIT/GM POWD    Apply 1 g topically daily as needed (for itching).   NYSTATIN-TRIAMCINOLONE (MYCOLOG II) CREAM    Apply topically 4 (four) times daily. Apply under abdomen folds twice daily until rash healed   OMEGA-3 ACID ETHYL ESTERS  (LOVAZA) 1 G CAPSULE    TAKE 1 CAPSULE BY MOUTH EVERY DAY   OXYCODONE (OXY IR/ROXICODONE) 5 MG IMMEDIATE RELEASE TABLET    Take one tablet every 6 hours as needed for severe pain, Take 2 tablets at bedtime.   PANTOPRAZOLE (PROTONIX) 40 MG TABLET    TAKE 1 TABLET BY MOUTH DAILY   PILOCARPINE (SALAGEN) 5 MG TABLET    Take 5 mg by mouth daily.   POLYVINYL ALCOHOL (LIQUIFILM TEARS) 1.4 % OPHTHALMIC SOLUTION    Place 1 drop into both eyes as needed (for dry eyes).    POTASSIUM CHLORIDE (MICRO-K) 10 MEQ CR CAPSULE    TAKE ONE CAPSULE BY MOUTH TWICE DAILY   PROMETHAZINE (PHENERGAN) 12.5 MG TABLET    TAKE 1 TABLET BY MOUTH AS NEEDED FOR SEVERE NAUSEA AS DIRECTED   TIOTROPIUM (SPIRIVA HANDIHALER) 18 MCG INHALATION CAPSULE    Place one capsule into the inhaler and inhale daily for breathing   TRAZODONE (DESYREL) 50 MG TABLET    Take 0.5-1 tablets (25-50 mg total) by mouth at bedtime as needed for sleep.   TRIAMTERENE-HYDROCHLOROTHIAZIDE (MAXZIDE-25) 37.5-25 MG PER TABLET    TAKE 1 TABLET BY MOUTH DAILY TO CONTROL BLOOD PRESSURE   XELJANZ 5 MG TABS    Take 5 mg by mouth daily. For Rheumatoid Arthritis  Modified Medications   No medications on file  Discontinued Medications   NOVOLOG FLEXPEN 100 UNIT/ML FLEXPEN    INJECT 20 UNITS INTO THE SKIN THREE TIMES DAILY BEFORE A MEAL     Physical Exam: Filed Vitals:   10/23/14 1033  BP: 158/64  Pulse: 105  Temp: 97.7 F (36.5 C)  Resp: 20  Height: 5\' 6"  (1.676 m)  Weight: 272 lb 6.4 oz (123.56 kg)  SpO2: 92%   Physical Exam  Constitutional: She is oriented  to person, place, and time. She appears well-developed and well-nourished.  HENT:  Oropharynx erthmatous, no white plaques noted  Cardiovascular: Normal rate, regular rhythm and normal heart sounds.   Pulmonary/Chest: Effort normal and breath sounds normal.  Abdominal: Soft. Bowel sounds are normal. She exhibits no distension and no mass. There is no tenderness.  Neurological: She is alert and  oriented to person, place, and time.  Skin: Skin is warm and dry.  Psychiatric: She has a normal mood and affect. Her behavior is normal.    Labs reviewed: Basic Metabolic Panel:  Recent Labs  12/18/13 0805 01/16/14 1405 02/10/14 1043 04/21/14 1419 08/01/14 0915  NA 142 142  --  141 141  K 4.0 3.9  --  4.0 4.4  CL 97 99  --  99 98  CO2 28 26  --  26 25  GLUCOSE 125* 116*  --  161* 128*  BUN 18 21  --  16 16  CREATININE 1.04* 1.09*  --  0.96 1.06*  CALCIUM 9.7 10.0  --  9.5 9.5  TSH 0.223* 0.114* 3.070  --   --    Liver Function Tests:  Recent Labs  12/18/13 0805 01/16/14 1405 08/01/14 0915  AST 37 25 39  ALT 24 19 28   ALKPHOS 124* 163* 140*  BILITOT 0.4 0.3 0.3  PROT 6.7 7.2 6.6   No results for input(s): LIPASE, AMYLASE in the last 8760 hours. No results for input(s): AMMONIA in the last 8760 hours. CBC:  Recent Labs  12/18/13 0805 01/16/14 1405  WBC 13.6* 22.5*  NEUTROABS 9.9* 17.3*  HGB 14.2 14.7  HCT 44.2 45.5  MCV 83 82  PLT  --  363   Lipid Panel:  Recent Labs  12/18/13 0805  CHOL 139  HDL 30*  LDLCALC 76  TRIG 163*  CHOLHDL 4.6*   Lab Results  Component Value Date   HGBA1C 8.7* 08/01/2014    Assessment/Plan 1. Diarrhea - is using imodium -says she has not had recent abx before this started -will check stool studies - Stool culture - C. difficile, PCR - Ambulatory referral to Gastroenterology - O+P Exam, Formalin Only  2. Type 2 diabetes mellitus without complication - cont lantus, humalog, victoza, lisinopril - Hemoglobin A1c - CMP - Lipid Panel  3. Obstructive chronic bronchitis without exacerbation -cont spiriva, qvar, mucinex  4. Chronic fatigue fibromyalgia syndrome - is doing pretty well with this at present -cont current regimen - gabapentin (NEURONTIN) 600 MG tablet; TAKE 1 TABLET BY MOUTH THREE TIMES DAILY  Dispense: 90 tablet; Refill: 0  5. Insomnia -increase melatonin to 10mg   6. Essential  hypertension -bp at goal, no changes  7. Globus sensation - has h/o esophageal stricture with dilatation previously by Dr. Olevia Perches - Ambulatory referral to Gastroenterology  Labs/tests ordered:   Orders Placed This Encounter  Procedures  . Stool culture  . C. difficile, PCR  . O+P Exam, Formalin Only  . Ambulatory referral to Gastroenterology    Referral Priority:  Routine    Referral Type:  Consultation    Referral Reason:  Specialty Services Required    Requested Specialty:  Gastroenterology    Number of Visits Requested:  1    Next appt:  3 mos, will order labs before when current labs return  [place] L. Jonie Burdell, D.O. [facility] [address], [place] [address] Cell Phone (Mon-Fri 8am-5pm):  [phone] On Call:  [phone] & follow prompts after  5pm & weekends Office Phone:  [phone] Office Fax:  [phone]

## 2014-10-24 ENCOUNTER — Encounter: Payer: Self-pay | Admitting: Internal Medicine

## 2014-10-24 LAB — COMPREHENSIVE METABOLIC PANEL
ALT: 31 IU/L (ref 0–32)
AST: 43 IU/L — ABNORMAL HIGH (ref 0–40)
Albumin/Globulin Ratio: 1.6 (ref 1.1–2.5)
Albumin: 4.1 g/dL (ref 3.5–4.8)
Alkaline Phosphatase: 122 IU/L — ABNORMAL HIGH (ref 39–117)
BUN/Creatinine Ratio: 15 (ref 11–26)
BUN: 14 mg/dL (ref 8–27)
Bilirubin Total: 0.5 mg/dL (ref 0.0–1.2)
CO2: 25 mmol/L (ref 18–29)
Calcium: 9.9 mg/dL (ref 8.7–10.3)
Chloride: 96 mmol/L — ABNORMAL LOW (ref 97–108)
Creatinine, Ser: 0.92 mg/dL (ref 0.57–1.00)
GFR calc Af Amer: 71 mL/min/{1.73_m2} (ref >59)
GFR calc non Af Amer: 62 mL/min/{1.73_m2} (ref >59)
Globulin, Total: 2.6 g/dL (ref 1.5–4.5)
Glucose: 82 mg/dL (ref 65–99)
Potassium: 3.8 mmol/L (ref 3.5–5.2)
Sodium: 139 mmol/L (ref 134–144)
Total Protein: 6.7 g/dL (ref 6.0–8.5)

## 2014-10-24 LAB — LIPID PANEL
Chol/HDL Ratio: 4 ratio units (ref 0.0–4.4)
Cholesterol, Total: 131 mg/dL (ref 100–199)
HDL: 33 mg/dL — ABNORMAL LOW (ref >39)
LDL Calculated: 72 mg/dL (ref 0–99)
Triglycerides: 128 mg/dL (ref 0–149)
VLDL Cholesterol Cal: 26 mg/dL (ref 5–40)

## 2014-10-24 LAB — HEMOGLOBIN A1C
Est. average glucose Bld gHb Est-mCnc: 183 mg/dL
Hgb A1c MFr Bld: 8 % — ABNORMAL HIGH (ref 4.8–5.6)

## 2014-10-29 ENCOUNTER — Other Ambulatory Visit: Payer: Medicare Other

## 2014-11-03 ENCOUNTER — Emergency Department (HOSPITAL_COMMUNITY)
Admission: EM | Admit: 2014-11-03 | Discharge: 2014-11-03 | Disposition: A | Discharge status: Home / Self Care | Payer: Medicare Other | Attending: Emergency Medicine | Admitting: Emergency Medicine

## 2014-11-03 ENCOUNTER — Encounter: Payer: Self-pay | Admitting: Pharmacotherapy

## 2014-11-03 ENCOUNTER — Other Ambulatory Visit: Payer: Medicare Other

## 2014-11-03 ENCOUNTER — Ambulatory Visit: Payer: Medicare Other | Admitting: Pharmacotherapy

## 2014-11-03 ENCOUNTER — Telehealth: Payer: Self-pay

## 2014-11-03 ENCOUNTER — Emergency Department (HOSPITAL_COMMUNITY): Payer: Medicare Other

## 2014-11-03 ENCOUNTER — Encounter (HOSPITAL_COMMUNITY): Payer: Self-pay | Admitting: Emergency Medicine

## 2014-11-03 ENCOUNTER — Ambulatory Visit (INDEPENDENT_AMBULATORY_CARE_PROVIDER_SITE_OTHER): Payer: Medicare Other | Admitting: Internal Medicine

## 2014-11-03 ENCOUNTER — Encounter: Payer: Self-pay | Admitting: Internal Medicine

## 2014-11-03 ENCOUNTER — Other Ambulatory Visit: Payer: Self-pay | Admitting: Internal Medicine

## 2014-11-03 VITALS — BP 156/90 | HR 100 | Temp 99.6°F | Resp 16 | Ht 66.0 in | Wt 273.4 lb

## 2014-11-03 DIAGNOSIS — Z872 Personal history of diseases of the skin and subcutaneous tissue: Secondary | ICD-10-CM | POA: Diagnosis not present

## 2014-11-03 DIAGNOSIS — F329 Major depressive disorder, single episode, unspecified: Secondary | ICD-10-CM | POA: Insufficient documentation

## 2014-11-03 DIAGNOSIS — M069 Rheumatoid arthritis, unspecified: Secondary | ICD-10-CM

## 2014-11-03 DIAGNOSIS — J449 Chronic obstructive pulmonary disease, unspecified: Secondary | ICD-10-CM | POA: Insufficient documentation

## 2014-11-03 DIAGNOSIS — Z8709 Personal history of other diseases of the respiratory system: Secondary | ICD-10-CM | POA: Diagnosis not present

## 2014-11-03 DIAGNOSIS — Z79899 Other long term (current) drug therapy: Secondary | ICD-10-CM | POA: Diagnosis not present

## 2014-11-03 DIAGNOSIS — G9332 Myalgic encephalomyelitis/chronic fatigue syndrome: Secondary | ICD-10-CM

## 2014-11-03 DIAGNOSIS — R079 Chest pain, unspecified: Secondary | ICD-10-CM | POA: Diagnosis not present

## 2014-11-03 DIAGNOSIS — Z87891 Personal history of nicotine dependence: Secondary | ICD-10-CM | POA: Diagnosis not present

## 2014-11-03 DIAGNOSIS — G8929 Other chronic pain: Secondary | ICD-10-CM | POA: Diagnosis not present

## 2014-11-03 DIAGNOSIS — R05 Cough: Secondary | ICD-10-CM | POA: Diagnosis not present

## 2014-11-03 DIAGNOSIS — K219 Gastro-esophageal reflux disease without esophagitis: Secondary | ICD-10-CM | POA: Diagnosis not present

## 2014-11-03 DIAGNOSIS — Z794 Long term (current) use of insulin: Secondary | ICD-10-CM | POA: Insufficient documentation

## 2014-11-03 DIAGNOSIS — G894 Chronic pain syndrome: Secondary | ICD-10-CM | POA: Diagnosis not present

## 2014-11-03 DIAGNOSIS — Z880 Allergy status to penicillin: Secondary | ICD-10-CM | POA: Insufficient documentation

## 2014-11-03 DIAGNOSIS — R5382 Chronic fatigue, unspecified: Secondary | ICD-10-CM

## 2014-11-03 DIAGNOSIS — Z8619 Personal history of other infectious and parasitic diseases: Secondary | ICD-10-CM | POA: Insufficient documentation

## 2014-11-03 DIAGNOSIS — M545 Low back pain, unspecified: Secondary | ICD-10-CM

## 2014-11-03 DIAGNOSIS — R197 Diarrhea, unspecified: Secondary | ICD-10-CM | POA: Diagnosis not present

## 2014-11-03 DIAGNOSIS — I10 Essential (primary) hypertension: Secondary | ICD-10-CM | POA: Insufficient documentation

## 2014-11-03 DIAGNOSIS — Z7982 Long term (current) use of aspirin: Secondary | ICD-10-CM | POA: Diagnosis not present

## 2014-11-03 DIAGNOSIS — E119 Type 2 diabetes mellitus without complications: Secondary | ICD-10-CM | POA: Insufficient documentation

## 2014-11-03 DIAGNOSIS — M797 Fibromyalgia: Secondary | ICD-10-CM

## 2014-11-03 LAB — BASIC METABOLIC PANEL
Anion gap: 9 (ref 5–15)
BUN: 15 mg/dL (ref 6–23)
CALCIUM: 9.4 mg/dL (ref 8.4–10.5)
CHLORIDE: 102 mmol/L (ref 96–112)
CO2: 28 mmol/L (ref 19–32)
CREATININE: 0.93 mg/dL (ref 0.50–1.10)
GFR calc Af Amer: 69 mL/min — ABNORMAL LOW (ref >90)
GFR calc non Af Amer: 60 mL/min — ABNORMAL LOW (ref >90)
GLUCOSE: 148 mg/dL — AB (ref 70–99)
Potassium: 4.1 mmol/L (ref 3.5–5.1)
Sodium: 139 mmol/L (ref 135–145)

## 2014-11-03 LAB — CBC
HCT: 42.4 % (ref 36.0–46.0)
Hemoglobin: 13.7 g/dL (ref 12.0–15.0)
MCH: 27.3 pg (ref 26.0–34.0)
MCHC: 32.3 g/dL (ref 30.0–36.0)
MCV: 84.5 fL (ref 78.0–100.0)
Platelets: 283 10*3/uL (ref 150–400)
RBC: 5.02 MIL/uL (ref 3.87–5.11)
RDW: 14.2 % (ref 11.5–15.5)
WBC: 18.1 10*3/uL — ABNORMAL HIGH (ref 4.0–10.5)

## 2014-11-03 LAB — I-STAT TROPONIN, ED
Troponin i, poc: 0 ng/mL (ref 0.00–0.08)
Troponin i, poc: 0 ng/mL (ref 0.00–0.08)

## 2014-11-03 LAB — D-DIMER, QUANTITATIVE (NOT AT ARMC): D DIMER QUANT: 0.59 ug{FEU}/mL — AB (ref 0.00–0.48)

## 2014-11-03 MED ORDER — HYDROCODONE-ACETAMINOPHEN 10-325 MG PO TABS: ORAL_TABLET | ORAL | Status: DC

## 2014-11-03 MED ORDER — OXYCODONE HCL 5 MG PO TABS
ORAL_TABLET | ORAL | Status: DC
Start: 2014-11-03 — End: 2014-12-03

## 2014-11-03 NOTE — ED Notes (Signed)
EKG performed by Merrily Pew, EMT

## 2014-11-03 NOTE — ED Notes (Signed)
Patient went to md this morning and had been having chest pain x 2 days.  Patient states she has L chest pain with occasional radiation to L arm.  Patient states has been having swelling in bilateral legs/ankles.  Patient states has been taking lasix at home.  Patient denies other symptoms.

## 2014-11-03 NOTE — ED Provider Notes (Signed)
CSN: [id number]     Arrival date & time 11/03/14  1249 History   First MD Initiated Contact with Patient 11/03/14 1600     Chief Complaint  Patient presents with  . Chest Pain     (Consider location/radiation/quality/duration/timing/severity/associated sxs/prior Treatment) Patient is a 74 y.o. female presenting with chest pain.  Chest Pain Pain location:  L chest Pain quality: dull   Pain radiates to:  L arm Pain radiates to the back: no   Pain severity:  Mild Onset quality:  Gradual Duration:  2 days Timing:  Constant Progression:  Waxing and waning Context: breathing and at rest   Relieved by:  None tried Worsened by:  Nothing tried Ineffective treatments:  None tried Associated symptoms: no abdominal pain, no cough, no fever, no nausea, no shortness of breath, not vomiting and no weakness   Risk factors: no birth control, no coronary artery disease and no prior DVT/PE     Past Medical History  Diagnosis Date  . COPD (chronic obstructive pulmonary disease)   . Hypertension   . RA (rheumatoid arthritis)   . Thyroid disease   . Miscarriage 1962  . History of benign thymus tumor   . Back injury   . GERD (gastroesophageal reflux disease)   . Arthritis   . Fibromyalgia   . Diabetes mellitus without complication   . Thyroid disorder   . Acute infective polyneuritis 2002  . Guillain-Barre syndrome   . Polyneuropathy in diabetes(357.2)   . Morbid obesity   . Spondylosis, lumbosacral   . Degenerative arthritis   . Dyslipidemia   . Spinal stenosis, lumbar region, without neurogenic claudication   . Candidiasis of skin and nails   . Acute maxillary sinusitis   . Insomnia, unspecified   . Candidiasis of vulva and vagina   . Edema   . Acute sinusitis, unspecified   . Obstructive chronic bronchitis with acute bronchitis   . Urinary tract infection, site not specified   . Unspecified hypothyroidism   . Type II or unspecified type diabetes mellitus with peripheral  circulatory disorders, uncontrolled(250.72)   . Mixed hyperlipidemia   . Other specified disease of white blood cells   . Depressive disorder, not elsewhere classified   . Chronic pain syndrome   . Tear film insufficiency, unspecified   . Unspecified essential hypertension   . Allergic rhinitis due to pollen   . Unspecified chronic bronchitis   . Reflux esophagitis   . Diaphragmatic hernia without mention of obstruction or gangrene   . Unspecified pruritic disorder   . Rheumatoid arthritis(714.0)   . Pain in joint, multiple sites   . Stiffness of joints, not elsewhere classified, multiple sites   . Lumbago   . Other malaise and fatigue   . Spontaneous ecchymoses   . Abnormal weight gain   . Shortness of breath   . Cough   . Unspecified urinary incontinence   . Osteopenia, senile    Past Surgical History  Procedure Laterality Date  . Abdominal hysterectomy  1974  . Tonsillectomy    . Cholecystectomy  1984  . Back surgery  1982  . Ovarian cyst surgery  1968  . Thymus tumor    . Knee surgery Bilateral 08/27/2010 (L) and 01/11/2011 (R)  . [place]  . Bronchoscopy  2001  . Abdominal tumor  2002  . Thymus tumor  10/2000   Family History  Problem Relation Age of Onset  . Alzheimer's disease Mother   . Heart disease  Mother   . Heart disease Father   . Liver disease Father   . Cancer Sister    History  Substance Use Topics  . Smoking status: Former Smoker    Types: Cigarettes    Quit date: 12/07/1979  . Smokeless tobacco: Not on file  . Alcohol Use: No   OB History    No data available     Review of Systems  Constitutional: Negative for fever and chills.  HENT: Negative for nosebleeds.   Eyes: Negative for visual disturbance.  Respiratory: Negative for cough and shortness of breath.   Cardiovascular: Positive for chest pain.  Gastrointestinal: Negative for nausea, vomiting, abdominal pain, diarrhea and constipation.  Genitourinary: Negative for dysuria.   Skin: Negative for rash.  Neurological: Negative for weakness.  All other systems reviewed and are negative.     Allergies  Penicillins and Sulfamethoxazole-trimethoprim  Home Medications   Prior to Admission medications   Medication Sig Start Date End Date Taking? Authorizing Provider  aspirin EC 81 MG tablet Take 81 mg by mouth daily as needed.    Yes Historical Provider, MD  Cholecalciferol 2000 UNITS TABS Take 2,000 Units by mouth daily.   Yes Historical Provider, MD  DM-Phenylephrine-Acetaminophen 10-5-325 MG CAPS Take 1 capsule by mouth daily as needed (for cold).   Yes Historical Provider, MD  DULoxetine (CYMBALTA) 60 MG capsule Take 1 capsule (60 mg total) by mouth 2 (two) times daily. 11/07/13  Yes Kathrynn Ducking, MD  furosemide (LASIX) 20 MG tablet Take 1 tablet (20 mg total) by mouth 2 (two) times daily as needed. Use as needed for 3 pounds gain in one day or 5 pounds in one week 06/13/13  Yes Tiffany L Reed, DO  gabapentin (NEURONTIN) 600 MG tablet TAKE 1 TABLET BY MOUTH THREE TIMES DAILY 10/23/14  Yes Tiffany L Reed, DO  guaiFENesin (MUCINEX) 600 MG 12 hr tablet Take 1 tablet (600 mg total) by mouth 2 (two) times daily as needed for cough or to loosen phlegm. 03/05/14  Yes Blanchie Serve, MD  HYDROcodone-acetaminophen (NORCO) 10-325 MG per tablet Take one tablet every three hours as needed for pain 11/03/14  Yes Tiffany L Reed, DO  insulin glargine (LANTUS) 100 UNIT/ML injection Inject 90 units daily.  Split into 2 injections of 45 units to improve absorption. Patient taking differently: Inject 90 Units into the skin at bedtime. Inject 90 units daily.  Split into 2 injections of 45 units to improve absorption. 05/05/14  Yes Tivis Ringer, RPH-CPP  Insulin Lispro, Human, (HUMALOG KWIKPEN) 200 UNIT/ML SOPN Inject 20 Units into the skin 3 (three) times daily with meals. 08/04/14  Yes Tivis Ringer, RPH-CPP  levothyroxine (SYNTHROID, LEVOTHROID) 175 MCG tablet TAKE 1 TABLET BY MOUTH  DAILY BEFORE BREAKFAST 08/20/14  Yes Tiffany L Reed, DO  lidocaine (LIDODERM) 5 % Place 1 patch onto the skin as needed. Patient taking differently: Place 1 patch onto the skin as needed (for pain).  03/05/14  Yes Mahima Pandey, MD  Liraglutide (VICTOZA) 18 MG/3ML SOPN Inject 1.8 mg into the skin daily. To control blood sugar Patient taking differently: Inject 1.8 mg into the skin every evening. To control blood sugar 07/10/14  Yes Lauree Chandler, NP  lisinopril (PRINIVIL,ZESTRIL) 5 MG tablet Take one tablet by mouth twice daily to control blood pressure Patient taking differently: Take 5 mg by mouth 2 (two) times daily. Take one tablet by mouth twice daily to control blood pressure 07/01/14  Yes Blanchie Serve, MD  loperamide (IMODIUM A-D) 2 MG tablet Take 2 mg by mouth 4 (four) times daily as needed for diarrhea or loose stools.   Yes Historical Provider, MD  Nystatin (NYAMYC) 100000 UNIT/GM POWD Apply 1 g topically daily as needed (for itching). 07/24/14  Yes Tiffany L Reed, DO  nystatin-triamcinolone (MYCOLOG II) cream Apply topically 4 (four) times daily. Apply under abdomen folds twice daily until rash healed Patient taking differently: Apply 1 application topically 4 (four) times daily as needed (for rash). Apply under abdomen folds twice daily until rash healed 07/24/14  Yes Tiffany L Reed, DO  omega-3 acid ethyl esters (LOVAZA) 1 G capsule TAKE 1 CAPSULE BY MOUTH EVERY DAY 07/24/14  Yes Tiffany L Reed, DO  oxyCODONE (OXY IR/ROXICODONE) 5 MG immediate release tablet Take one tablet every 6 hours as needed for severe pain, Take 2 tablets at bedtime. 11/03/14  Yes Tiffany L Reed, DO  pantoprazole (PROTONIX) 40 MG tablet TAKE 1 TABLET BY MOUTH DAILY 10/22/14  Yes Estill Dooms, MD  pilocarpine (SALAGEN) 5 MG tablet Take 5 mg by mouth daily. For dry mouth 09/24/13  Yes Historical Provider, MD  polyvinyl alcohol (LIQUIFILM TEARS) 1.4 % ophthalmic solution Place 1 drop into both eyes as needed (for dry  eyes).    Yes Historical Provider, MD  potassium chloride (MICRO-K) 10 MEQ CR capsule TAKE ONE CAPSULE BY MOUTH TWICE DAILY Patient taking differently: TAKE ONE CAPSULE BY MOUTH TWICE DAILY as needed with fluid pill 10/22/14  Yes Estill Dooms, MD  promethazine (PHENERGAN) 12.5 MG tablet TAKE 1 TABLET BY MOUTH AS NEEDED FOR SEVERE NAUSEA AS DIRECTED 10/22/14  Yes Estill Dooms, MD  tiotropium (SPIRIVA HANDIHALER) 18 MCG inhalation capsule Place one capsule into the inhaler and inhale daily for breathing 08/25/14  Yes Tiffany L Reed, DO  triamterene-hydrochlorothiazide (MAXZIDE-25) 37.5-25 MG per tablet TAKE 1 TABLET BY MOUTH DAILY TO CONTROL BLOOD PRESSURE 07/24/14  Yes Tiffany L Reed, DO  XELJANZ 5 MG TABS Take 5 mg by mouth daily. For Rheumatoid Arthritis 03/28/13  Yes Historical Provider, MD  glucose blood (ACCU-CHEK SMARTVIEW) test strip Use as instructed Patient taking differently: Check blood sugar 3 x daily 08/04/14   Tivis Ringer, RPH-CPP  Insulin Pen Needle 31G X 5 MM MISC Use with Insulin pen as instructed. Dx E11.59 08/04/14   Tivis Ringer, RPH-CPP  Insulin Syringe-Needle U-100 31G X 5/16" 1 ML MISC Use once daily with administration of Insulin DX E11.59 08/04/14   Cathey Miller, RPH-CPP   BP 121/42 mmHg  Pulse 88  Temp(Src) 99 F (37.2 C) (Oral)  Resp 14  Ht 5\' 6"  (1.676 m)  Wt 272 lb 1 oz (123.407 kg)  BMI 43.93 kg/m2  SpO2 93% Physical Exam  Constitutional: She is oriented to person, place, and time. No distress.  HENT:  Head: Normocephalic and atraumatic.  Eyes: EOM are normal. Pupils are equal, round, and reactive to light.  Neck: Normal range of motion. Neck supple.  Cardiovascular: Intact distal pulses.   Pulmonary/Chest: No respiratory distress.  Abdominal: Soft. There is no tenderness.  Musculoskeletal: Normal range of motion.  Neurological: She is alert and oriented to person, place, and time.  Skin: No rash noted. She is not diaphoretic.  Psychiatric: She has a  normal mood and affect.  Vitals reviewed.   ED Course  Procedures (including critical care time) Labs Review Labs Reviewed  CBC - Abnormal; Notable for the following:    WBC 18.1 (*)    All  other components within normal limits  BASIC METABOLIC PANEL - Abnormal; Notable for the following:    Glucose, Bld 148 (*)    GFR calc non Af Amer 60 (*)    GFR calc Af Amer 69 (*)    All other components within normal limits  D-DIMER, QUANTITATIVE - Abnormal; Notable for the following:    D-Dimer, Quant 0.59 (*)    All other components within normal limits  I-STAT TROPOININ, ED  Randolm Idol, ED    Imaging Review Dg Chest 2 View  11/03/2014   CLINICAL DATA:  Left-sided chest pain for 2-3 days.  Cough.  EXAM: CHEST  2 VIEW  COMPARISON:  03/18/2014  FINDINGS: Prior median sternotomy is again noted. The cardiomediastinal silhouette is within normal limits. Thoracic aortic calcification is noted. The lungs are mildly hyperinflated with chronic interstitial coarsening, suggestive of underlying COPD. Minimal opacity laterally in the left lung base is unchanged, likely scarring. There is no evidence of acute airspace consolidation, edema, pleural effusion, or pneumothorax. No acute osseous abnormality is identified.  IMPRESSION: No active cardiopulmonary disease.   Electronically Signed   By: Logan Bores   On: 11/03/2014 14:45     EKG Interpretation   Date/Time:    Ventricular Rate:    PR Interval:    QRS Duration:   QT Interval:    QTC Calculation:   R Axis:     Text Interpretation:        MDM   Final diagnoses:  None   74 y/o female with PMH (HTN, HL, DM, fibromyalgia) Patient presents with two days of left sided, dull chest pain that is localized to a very small area but will occasionally radiate to the left arm.  It is made worse with palpation, worse with taking a deep breath.    The chest pain has been constant but is very mild.  Patient has noticed no unilateral leg  swelling, no previous dvt/pe.  No nausea/diaphoresis.  On exam, patient is afebrile, HDS but mild tachycardia initially at 105.  Lungs CTAB.  Patient satting well on room air.  Patient is fairly low risk for PE given no prev hx of dvt/pe, no unilateral leg swelling, no hx of cancer, no recent surgeries.  Well's score of 1, will obtain dimer.  Pain sounds atypical for ACS, patient's ACS risk factors are HTN, HL, DM.  No hx of CAD.  EKG w/o ischemic changes.  Delta trop undetectable both times, doubt ACS.    Dimer is 0.59, using age adjusted Dimer, this patient is unlikely to have dvt/pe.  At this time, feel safe for d/c.  I have discussed the results, Dx and Tx plan with the patient. They expressed understanding and agree with the plan and were told to return to ED with any worsening of condition or concern.    Disposition: Discharge  Condition: Good  Discharge Medication List as of 11/03/2014  6:52 PM      Follow Up: Gayland Curry, DO [place] [place] [address] [phone]      Pt seen in conjunction with Dr. Alecia Lemming, MD 11/04/14 [place], MD 11/05/14 [phone]

## 2014-11-03 NOTE — Patient Instructions (Signed)
Please go to the ER.  I am concerned you may have a PE based on your pleuritic pain, risk factors, and ow rade temperature.  They can promptly do a CT chest there.  EKG here was unchanged.  Copies provided.

## 2014-11-03 NOTE — Discharge Instructions (Signed)

## 2014-11-03 NOTE — Telephone Encounter (Signed)
Patient was seen today by Dr. Mariea Clonts

## 2014-11-03 NOTE — Telephone Encounter (Signed)
Patient was here today to see Oretha Ellis for DM management. Patient c/o Chest Pain since Saturday (non-stop), left side arm pain, sweats and leg swelling. Patient aware Oretha Ellis is unable to address these concerns. Dr.Reeds schedule is completely booked. We discussed these concerns with Dr.Reed, per Dr.Reed do an EKG on patient.   Patient with low-grade temp today of 99.6 (oral)   EKG completed and given to Dr.Reed, please advise

## 2014-11-03 NOTE — Progress Notes (Signed)
Patient ID: Katherine Delgado, female   DOB: [birth year], 74 y.o.   MRN: [id number]   Location:  [facility]  Code Status:   Allergies  Allergen Reactions  . Penicillins     Reaction not known  . Sulfamethoxazole-Trimethoprim     Reaction not known    Chief Complaint  Patient presents with  . Acute Visit    Chest pain, leg swelling, cold sweats, and left arm pain since Saturday.    HPI: Patient is a 74 y.o. female seen in the office today for an acute visit for chest pain.  She has a h/o COPD, diabetes, neuropathy, chronic low back pain and fibromyalgia.  She follows with Dr. Estanislado Pandy.  I was asked to see her today acutely (she was meant to see Tivis Ringer about her diabetes).    Chest pain that started Saturday. In left chest mid clavicular line. Pressure at times, but mostly a pain that worsens with deep breaths, deep exhalation, and movement. Has radiated to L arm at times. Pain medication has provided relief. Has been diaphoretic a few times, transient nausea and dizziness. Checked her blood sugar at these times and noted not low or high. Notes shortness of breath. Has interrupted sleep and has also had increased fatigue. Denies cough, fever, orthopnea, chills.Temp Slightly elevated at 99.7 (on tylenol-containing pain meds). and elevated BP today. O2 99% which is inconsistent.  Pain not reproducible tenderness.     Bilateral ankle edema that is improving.   Diarrhea addressed at previous visit has resolved.  She never brought samples to the office as ordered.  Review of Systems:  Review of Systems  Constitutional: Positive for fever, chills and malaise/fatigue.  Respiratory: Positive for cough and shortness of breath. Negative for sputum production.   Cardiovascular: Positive for chest pain and leg swelling.  Musculoskeletal: Positive for myalgias and back pain. Negative for falls.  Neurological: Positive for weakness.     Past Medical  History  Diagnosis Date  . COPD (chronic obstructive pulmonary disease)   . Hypertension   . RA (rheumatoid arthritis)   . Thyroid disease   . Miscarriage 1962  . History of benign thymus tumor   . Back injury   . GERD (gastroesophageal reflux disease)   . Arthritis   . Fibromyalgia   . Diabetes mellitus without complication   . Thyroid disorder   . Acute infective polyneuritis 2002  . Guillain-Barre syndrome   . Polyneuropathy in diabetes(357.2)   . Morbid obesity   . Spondylosis, lumbosacral   . Degenerative arthritis   . Dyslipidemia   . Spinal stenosis, lumbar region, without neurogenic claudication   . Candidiasis of skin and nails   . Acute maxillary sinusitis   . Insomnia, unspecified   . Candidiasis of vulva and vagina   . Edema   . Acute sinusitis, unspecified   . Obstructive chronic bronchitis with acute bronchitis   . Urinary tract infection, site not specified   . Unspecified hypothyroidism   . Type II or unspecified type diabetes mellitus with peripheral circulatory disorders, uncontrolled(250.72)   . Mixed hyperlipidemia   . Other specified disease of white blood cells   . Depressive disorder, not elsewhere classified   . Chronic pain syndrome   . Tear film insufficiency, unspecified   . Unspecified essential hypertension   . Allergic rhinitis due to pollen   . Unspecified chronic bronchitis   . Reflux esophagitis   . Diaphragmatic hernia without  mention of obstruction or gangrene   . Unspecified pruritic disorder   . Rheumatoid arthritis(714.0)   . Pain in joint, multiple sites   . Stiffness of joints, not elsewhere classified, multiple sites   . Lumbago   . Other malaise and fatigue   . Spontaneous ecchymoses   . Abnormal weight gain   . Shortness of breath   . Cough   . Unspecified urinary incontinence   . Osteopenia, senile     Past Surgical History  Procedure Laterality Date  . Abdominal hysterectomy  1974  . Tonsillectomy    .  Cholecystectomy  1984  . Back surgery  1982  . Ovarian cyst surgery  1968  . Thymus tumor    . Knee surgery Bilateral 08/27/2010 (L) and 01/11/2011 (R)  . [place]  . Bronchoscopy  2001  . Abdominal tumor  2002  . Thymus tumor  10/2000    Social History:   reports that she quit smoking about 34 years ago. Her smoking use included Cigarettes. She does not have any smokeless tobacco history on file. She reports that she does not drink alcohol or use illicit drugs.  Family History  Problem Relation Age of Onset  . Alzheimer's disease Mother   . Heart disease Mother   . Heart disease Father   . Liver disease Father   . Cancer Sister     Medications: Patient's Medications  New Prescriptions   No medications on file  Previous Medications   ASPIRIN EC 81 MG TABLET    Take 81 mg by mouth daily as needed.    CHOLECALCIFEROL (VITAMIN D-3) 5000 UNITS TABS    Take by mouth daily. 1 by mouth daily   DULOXETINE (CYMBALTA) 60 MG CAPSULE    Take 1 capsule (60 mg total) by mouth 2 (two) times daily.   FOLIC ACID (FOLVITE) 614 MCG TABLET    Take 400 mcg by mouth 2 (two) times daily as needed.    FUROSEMIDE (LASIX) 20 MG TABLET    Take 1 tablet (20 mg total) by mouth 2 (two) times daily as needed. Use as needed for 3 pounds gain in one day or 5 pounds in one week   GABAPENTIN (NEURONTIN) 600 MG TABLET    TAKE 1 TABLET BY MOUTH THREE TIMES DAILY   GLUCOSE BLOOD (ACCU-CHEK SMARTVIEW) TEST STRIP    Use as instructed   GUAIFENESIN (MUCINEX) 600 MG 12 HR TABLET    Take 1 tablet (600 mg total) by mouth 2 (two) times daily as needed for cough or to loosen phlegm.   HYDROCODONE-ACETAMINOPHEN (NORCO) 10-325 MG PER TABLET    Take one tablet every three hours as needed for pain   INSULIN GLARGINE (LANTUS) 100 UNIT/ML INJECTION    Inject 90 units daily.  Split into 2 injections of 45 units to improve absorption.   INSULIN LISPRO, HUMAN, (HUMALOG KWIKPEN) 200 UNIT/ML SOPN    Inject 20 Units into the  skin 3 (three) times daily with meals.   INSULIN PEN NEEDLE 31G X 5 MM MISC    Use with Insulin pen as instructed. Dx E11.59   INSULIN SYRINGE-NEEDLE U-100 31G X 5/16" 1 ML MISC    Use once daily with administration of Insulin DX E11.59   LEVOTHYROXINE (SYNTHROID, LEVOTHROID) 175 MCG TABLET    TAKE 1 TABLET BY MOUTH DAILY BEFORE BREAKFAST   LIDOCAINE (LIDODERM) 5 %    Place 1 patch onto the skin as needed.   LIRAGLUTIDE (VICTOZA)  18 MG/3ML SOPN    Inject 1.8 mg into the skin daily. To control blood sugar   LISINOPRIL (PRINIVIL,ZESTRIL) 5 MG TABLET    Take one tablet by mouth twice daily to control blood pressure   MELATONIN 5 MG CAPS    Take 1 capsule (5 mg total) by mouth at bedtime as needed (insomnia).   NYSTATIN (NYAMYC) 100000 UNIT/GM POWD    Apply 1 g topically daily as needed (for itching).   NYSTATIN-TRIAMCINOLONE (MYCOLOG II) CREAM    Apply topically 4 (four) times daily. Apply under abdomen folds twice daily until rash healed   OMEGA-3 ACID ETHYL ESTERS (LOVAZA) 1 G CAPSULE    TAKE 1 CAPSULE BY MOUTH EVERY DAY   OXYCODONE (OXY IR/ROXICODONE) 5 MG IMMEDIATE RELEASE TABLET    Take one tablet every 6 hours as needed for severe pain, Take 2 tablets at bedtime.   PANTOPRAZOLE (PROTONIX) 40 MG TABLET    TAKE 1 TABLET BY MOUTH DAILY   PILOCARPINE (SALAGEN) 5 MG TABLET    Take 5 mg by mouth daily. For dry mouth   POLYVINYL ALCOHOL (LIQUIFILM TEARS) 1.4 % OPHTHALMIC SOLUTION    Place 1 drop into both eyes as needed (for dry eyes).    POTASSIUM CHLORIDE (MICRO-K) 10 MEQ CR CAPSULE    TAKE ONE CAPSULE BY MOUTH TWICE DAILY   PROMETHAZINE (PHENERGAN) 12.5 MG TABLET    TAKE 1 TABLET BY MOUTH AS NEEDED FOR SEVERE NAUSEA AS DIRECTED   TIOTROPIUM (SPIRIVA HANDIHALER) 18 MCG INHALATION CAPSULE    Place one capsule into the inhaler and inhale daily for breathing   TRIAMTERENE-HYDROCHLOROTHIAZIDE (MAXZIDE-25) 37.5-25 MG PER TABLET    TAKE 1 TABLET BY MOUTH DAILY TO CONTROL BLOOD PRESSURE   XELJANZ 5 MG TABS     Take 5 mg by mouth daily. For Rheumatoid Arthritis  Modified Medications   No medications on file  Discontinued Medications   No medications on file     Physical Exam: Filed Vitals:   11/03/14 1047  BP: 156/90  Pulse: 100  Temp: 99.6 F (37.6 C)  TempSrc: Oral  Resp: 16  Height: 5\' 6"  (1.676 m)  Weight: 273 lb 6.4 oz (124.013 kg)  SpO2: 99%   Physical Exam  Constitutional: She is oriented to person, place, and time. She appears well-developed and well-nourished.  Cardiovascular: Regular rhythm, normal heart sounds and intact distal pulses.  Exam reveals no gallop and no friction rub.   No murmur heard. Tachy, chest tender to palpation in all areas  Pulmonary/Chest: Breath sounds normal. She has no wheezes. She has no rales. She exhibits no tenderness.  Increased work of breathing noted when ambulating  Abdominal: Soft. Bowel sounds are normal.  Musculoskeletal: She exhibits edema.  Neurological: She is alert and oriented to person, place, and time.  Skin: Skin is warm and dry.  Psychiatric:  Restless    Labs reviewed: Basic Metabolic Panel:  Recent Labs  12/18/13 0805 01/16/14 1405 02/10/14 1043 04/21/14 1419 08/01/14 0915 10/23/14 0925  NA 142 142  --  141 141 139  K 4.0 3.9  --  4.0 4.4 3.8  CL 97 99  --  99 98 96*  CO2 28 26  --  26 25 25   GLUCOSE 125* 116*  --  161* 128* 82  BUN 18 21  --  16 16 14   CREATININE 1.04* 1.09*  --  0.96 1.06* 0.92  CALCIUM 9.7 10.0  --  9.5 9.5 9.9  TSH 0.223*  0.114* 3.070  --   --   --    Liver Function Tests:  Recent Labs  01/16/14 1405 08/01/14 0915 10/23/14 0925  AST 25 39 43*  ALT 19 28 31   ALKPHOS 163* 140* 122*  BILITOT 0.3 0.3 0.5  PROT 7.2 6.6 6.7   No results for input(s): LIPASE, AMYLASE in the last 8760 hours. No results for input(s): AMMONIA in the last 8760 hours. CBC:  Recent Labs  12/18/13 0805 01/16/14 1405  WBC 13.6* 22.5*  NEUTROABS 9.9* 17.3*  HGB 14.2 14.7  HCT 44.2 45.5  MCV 83  82  PLT  --  363   Lipid Panel:  Recent Labs  12/18/13 0805 10/23/14 0925  CHOL 139 131  HDL 30* 33*  LDLCALC 76 72  TRIG 163* 128  CHOLHDL 4.6* 4.0   Lab Results  Component Value Date   HGBA1C 8.0* 10/23/2014    Past Procedures: EKG today sinus rhythm and unchanged from 05/07/13  Assessment/Plan 1. Chest pain, unspecified chest pain type -given her uncontrolled diabetes at high risk for cardiac event, relative immobility due to her chronic pain syndrome I am concerned enough to want her seen in the ED. -pain ongoing for days w/o resolution -pleuritic with tachycardia (chronic tachy though) and low grade temp concerning for PE though POX normal--also could have pneumonia - EKG 12-Lead done as above--copies given -referred to ED for chest pain eval and tx -complex pt given her chronic pain syndrome and COPD, as well  Labs/tests ordered:  ekg Next appt:  Return in f/u after hospitalization  Jamaury Gumz L. Kely Dohn, D.O. [facility] [address], [place] [address] Cell Phone (Mon-Fri 8am-5pm):  [phone] On Call:  [phone] & follow prompts after 5pm & weekends Office Phone:  [phone] Office Fax:  [phone]

## 2014-11-05 DIAGNOSIS — Z79899 Other long term (current) drug therapy: Secondary | ICD-10-CM | POA: Diagnosis not present

## 2014-11-07 LAB — STOOL CULTURE

## 2014-11-07 LAB — OVA AND PARASITE EXAMINATION

## 2014-11-07 LAB — CLOSTRIDIUM DIFFICILE BY PCR

## 2014-11-11 DIAGNOSIS — M170 Bilateral primary osteoarthritis of knee: Secondary | ICD-10-CM | POA: Diagnosis not present

## 2014-11-11 DIAGNOSIS — M3500 Sicca syndrome, unspecified: Secondary | ICD-10-CM | POA: Diagnosis not present

## 2014-11-11 DIAGNOSIS — M797 Fibromyalgia: Secondary | ICD-10-CM | POA: Diagnosis not present

## 2014-11-11 DIAGNOSIS — M069 Rheumatoid arthritis, unspecified: Secondary | ICD-10-CM | POA: Diagnosis not present

## 2014-11-11 DIAGNOSIS — Z111 Encounter for screening for respiratory tuberculosis: Secondary | ICD-10-CM | POA: Diagnosis not present

## 2014-11-25 ENCOUNTER — Other Ambulatory Visit: Payer: Self-pay | Admitting: Internal Medicine

## 2014-12-03 ENCOUNTER — Other Ambulatory Visit: Payer: Self-pay

## 2014-12-03 DIAGNOSIS — M545 Low back pain, unspecified: Secondary | ICD-10-CM

## 2014-12-03 DIAGNOSIS — G9332 Myalgic encephalomyelitis/chronic fatigue syndrome: Secondary | ICD-10-CM

## 2014-12-03 DIAGNOSIS — R5382 Chronic fatigue, unspecified: Secondary | ICD-10-CM

## 2014-12-03 DIAGNOSIS — M069 Rheumatoid arthritis, unspecified: Secondary | ICD-10-CM

## 2014-12-03 DIAGNOSIS — G8929 Other chronic pain: Secondary | ICD-10-CM

## 2014-12-03 DIAGNOSIS — M797 Fibromyalgia: Secondary | ICD-10-CM

## 2014-12-03 MED ORDER — OXYCODONE HCL 5 MG PO TABS: ORAL_TABLET | ORAL | Status: DC

## 2014-12-03 MED ORDER — HYDROCODONE-ACETAMINOPHEN 10-325 MG PO TABS: ORAL_TABLET | ORAL | Status: DC

## 2014-12-03 NOTE — Telephone Encounter (Signed)
Patient aware rx's will be available for pick-up on Thursday 12/04/14

## 2014-12-04 ENCOUNTER — Other Ambulatory Visit: Payer: Self-pay | Admitting: *Deleted

## 2014-12-04 ENCOUNTER — Encounter: Payer: Self-pay | Admitting: Internal Medicine

## 2014-12-04 DIAGNOSIS — R5382 Chronic fatigue, unspecified: Principal | ICD-10-CM

## 2014-12-04 DIAGNOSIS — G9332 Myalgic encephalomyelitis/chronic fatigue syndrome: Secondary | ICD-10-CM

## 2014-12-04 DIAGNOSIS — M069 Rheumatoid arthritis, unspecified: Secondary | ICD-10-CM

## 2014-12-04 DIAGNOSIS — G8929 Other chronic pain: Secondary | ICD-10-CM

## 2014-12-04 DIAGNOSIS — M797 Fibromyalgia: Secondary | ICD-10-CM

## 2014-12-04 DIAGNOSIS — M545 Low back pain: Secondary | ICD-10-CM

## 2014-12-04 MED ORDER — OXYCODONE HCL 5 MG PO TABS: ORAL_TABLET | ORAL | Status: DC

## 2014-12-04 MED ORDER — HYDROCODONE-ACETAMINOPHEN 10-325 MG PO TABS: ORAL_TABLET | ORAL | Status: DC

## 2014-12-04 NOTE — Telephone Encounter (Signed)
Dr. Nyoka Cowden did not sign both Rxs and pharmacy would not accept. Reprinted and given to Dr. Mariea Clonts.

## 2014-12-08 ENCOUNTER — Encounter: Payer: Self-pay | Admitting: Nurse Practitioner

## 2014-12-08 ENCOUNTER — Ambulatory Visit (INDEPENDENT_AMBULATORY_CARE_PROVIDER_SITE_OTHER): Payer: Medicare Other | Admitting: Nurse Practitioner

## 2014-12-08 VITALS — BP 138/76 | HR 108 | Temp 98.1°F | Resp 20 | Ht 66.0 in | Wt 267.4 lb

## 2014-12-08 DIAGNOSIS — J069 Acute upper respiratory infection, unspecified: Secondary | ICD-10-CM

## 2014-12-08 DIAGNOSIS — H1011 Acute atopic conjunctivitis, right eye: Secondary | ICD-10-CM | POA: Diagnosis not present

## 2014-12-08 MED ORDER — DOXYCYCLINE HYCLATE 100 MG PO TABS: 100.0000 mg | ORAL_TABLET | Freq: Two times a day (BID) | ORAL | Status: DC

## 2014-12-08 MED ORDER — OLOPATADINE HCL 0.1 % OP SOLN: 1.0000 [drp] | Freq: Two times a day (BID) | OPHTHALMIC | Status: DC

## 2014-12-08 NOTE — Patient Instructions (Signed)
Mucinex DM by mouth twice daily for 1 week for cough and congestion, take with full glass of water Doxycyline 100 mg twice daily for 10 days patanol 1 drop twice daily for 1 week Increase hydration

## 2014-12-08 NOTE — Progress Notes (Signed)
Patient ID: Katherine Delgado, female   DOB: 1941-02-23, 74 y.o.   MRN: [id number]    PCP: Hollace Kinnier, DO  Allergies  Allergen Reactions  . Penicillins Hives, Itching, Swelling and Rash  . Sulfamethoxazole-Trimethoprim Itching    Chief Complaint  Patient presents with  . Acute Visit    cold and sore throat x 4 days     HPI: Patient is a 74 y.o. female seen in the office today for evaluation of sore throat and congestion. For 4 days pt with cough with minimal amount of yellowish sputum. Worse sore throat she has ever had. gurgling with saline for throat.  Also reports watery right eye and pressure in right ear. Having congestion in chest and sinusitis.  Son just went to see his ex-wife and her family and they were all sick and she thinks he brought it home to her.  No nausea or vomiting, no diarrhea.  Weak and achy all over.  Taking mucinex and alka seltzer plus cold Using Spiriva as already prescribed. No worsening shortness of breath except when she is coughing. Overall she actually feel better today.   Hurts to swallow but has increased her liquids.  No appetite.  Review of Systems:  Review of Systems  Constitutional: Positive for fever, activity change, appetite change and fatigue.  HENT: Positive for congestion, postnasal drip, rhinorrhea, sinus pressure and sore throat. Negative for ear discharge, ear pain, sneezing, tinnitus, trouble swallowing and voice change.   Eyes: Positive for redness and itching. Negative for photophobia, pain, discharge and visual disturbance.  Respiratory: Positive for cough. Negative for chest tightness, shortness of breath and wheezing.   Cardiovascular: Negative for chest pain.  Gastrointestinal: Negative for diarrhea and constipation.  Genitourinary: Negative for difficulty urinating.  Neurological: Negative for dizziness, light-headedness and headaches.    Past Medical History  Diagnosis Date  . COPD (chronic obstructive pulmonary disease)    . Hypertension   . RA (rheumatoid arthritis)   . Thyroid disease   . Miscarriage 1962  . History of benign thymus tumor   . Back injury   . GERD (gastroesophageal reflux disease)   . Arthritis   . Fibromyalgia   . Diabetes mellitus without complication   . Thyroid disorder   . Acute infective polyneuritis 2002  . Guillain-Barre syndrome   . Polyneuropathy in diabetes(357.2)   . Morbid obesity   . Spondylosis, lumbosacral   . Degenerative arthritis   . Dyslipidemia   . Spinal stenosis, lumbar region, without neurogenic claudication   . Candidiasis of skin and nails   . Acute maxillary sinusitis   . Insomnia, unspecified   . Candidiasis of vulva and vagina   . Edema   . Acute sinusitis, unspecified   . Obstructive chronic bronchitis with acute bronchitis   . Urinary tract infection, site not specified   . Unspecified hypothyroidism   . Type II or unspecified type diabetes mellitus with peripheral circulatory disorders, uncontrolled(250.72)   . Mixed hyperlipidemia   . Other specified disease of white blood cells   . Depressive disorder, not elsewhere classified   . Chronic pain syndrome   . Tear film insufficiency, unspecified   . Unspecified essential hypertension   . Allergic rhinitis due to pollen   . Unspecified chronic bronchitis   . Reflux esophagitis   . Diaphragmatic hernia without mention of obstruction or gangrene   . Unspecified pruritic disorder   . Rheumatoid arthritis(714.0)   . Pain in joint, multiple sites   .  Stiffness of joints, not elsewhere classified, multiple sites   . Lumbago   . Other malaise and fatigue   . Spontaneous ecchymoses   . Abnormal weight gain   . Shortness of breath   . Cough   . Unspecified urinary incontinence   . Osteopenia, senile    Past Surgical History  Procedure Laterality Date  . Abdominal hysterectomy  1974  . Tonsillectomy    . Cholecystectomy  1984  . Back surgery  1982  . Ovarian cyst surgery  1968  . Thymus  tumor    . Knee surgery Bilateral 08/27/2010 (L) and 01/11/2011 (R)  . [place]  . Bronchoscopy  2001  . Abdominal tumor  2002  . Thymus tumor  10/2000   Social History:   reports that she quit smoking about 35 years ago. Her smoking use included Cigarettes. She does not have any smokeless tobacco history on file. She reports that she does not drink alcohol or use illicit drugs.  Family History  Problem Relation Age of Onset  . Alzheimer's disease Mother   . Heart disease Mother   . Heart disease Father   . Liver disease Father   . Cancer Sister     Medications: Patient's Medications  New Prescriptions   No medications on file  Previous Medications   ASPIRIN EC 81 MG TABLET    Take 81 mg by mouth daily as needed.    CHOLECALCIFEROL 2000 UNITS TABS    Take 2,000 Units by mouth daily.   DM-PHENYLEPHRINE-ACETAMINOPHEN 10-5-325 MG CAPS    Take 1 capsule by mouth daily as needed (for cold).   DULOXETINE (CYMBALTA) 60 MG CAPSULE    Take 1 capsule (60 mg total) by mouth 2 (two) times daily.   FUROSEMIDE (LASIX) 20 MG TABLET    Take 1 tablet (20 mg total) by mouth 2 (two) times daily as needed. Use as needed for 3 pounds gain in one day or 5 pounds in one week   GABAPENTIN (NEURONTIN) 600 MG TABLET    TAKE 1 TABLET BY MOUTH THREE TIMES DAILY   GLUCOSE BLOOD (ACCU-CHEK SMARTVIEW) TEST STRIP    Use as instructed   GUAIFENESIN (MUCINEX) 600 MG 12 HR TABLET    Take 1 tablet (600 mg total) by mouth 2 (two) times daily as needed for cough or to loosen phlegm.   HYDROCODONE-ACETAMINOPHEN (NORCO) 10-325 MG PER TABLET    Take one tablet every three hours as needed for pain   INSULIN GLARGINE (LANTUS) 100 UNIT/ML INJECTION    Inject 90 units daily.  Split into 2 injections of 45 units to improve absorption.   INSULIN LISPRO, HUMAN, (HUMALOG KWIKPEN) 200 UNIT/ML SOPN    Inject 20 Units into the skin 3 (three) times daily with meals.   INSULIN PEN NEEDLE 31G X 5 MM MISC    Use with Insulin pen  as instructed. Dx E11.59   INSULIN SYRINGE-NEEDLE U-100 31G X 5/16" 1 ML MISC    Use once daily with administration of Insulin DX E11.59   LEVOTHYROXINE (SYNTHROID, LEVOTHROID) 175 MCG TABLET    TAKE 1 TABLET BY MOUTH DAILY BEFORE BREAKFAST   LIDOCAINE (LIDODERM) 5 %    Place 1 patch onto the skin as needed.   LIRAGLUTIDE (VICTOZA) 18 MG/3ML SOPN    Inject 1.8 mg into the skin daily. To control blood sugar   LISINOPRIL (PRINIVIL,ZESTRIL) 5 MG TABLET    Take one tablet by mouth twice daily to control blood  pressure   LOPERAMIDE (IMODIUM A-D) 2 MG TABLET    Take 2 mg by mouth 4 (four) times daily as needed for diarrhea or loose stools.   NYSTATIN (NYAMYC) 100000 UNIT/GM POWD    Apply 1 g topically daily as needed (for itching).   NYSTATIN-TRIAMCINOLONE (MYCOLOG II) CREAM    Apply topically 4 (four) times daily. Apply under abdomen folds twice daily until rash healed   OMEGA-3 ACID ETHYL ESTERS (LOVAZA) 1 G CAPSULE    TAKE 1 CAPSULE BY MOUTH EVERY DAY   OXYCODONE (OXY IR/ROXICODONE) 5 MG IMMEDIATE RELEASE TABLET    Take one tablet every 6 hours as needed for severe pain, Take 2 tablets at bedtime.   PANTOPRAZOLE (PROTONIX) 40 MG TABLET    TAKE 1 TABLET BY MOUTH DAILY   PILOCARPINE (SALAGEN) 5 MG TABLET    Take 5 mg by mouth daily. For dry mouth   POLYVINYL ALCOHOL (LIQUIFILM TEARS) 1.4 % OPHTHALMIC SOLUTION    Place 1 drop into both eyes as needed (for dry eyes).    POTASSIUM CHLORIDE (MICRO-K) 10 MEQ CR CAPSULE    TAKE ONE CAPSULE BY MOUTH TWICE DAILY   PROMETHAZINE (PHENERGAN) 12.5 MG TABLET    TAKE 1 TABLET BY MOUTH AS NEEDED FOR SEVERE NAUSEA AS DIRECTED   TIOTROPIUM (SPIRIVA HANDIHALER) 18 MCG INHALATION CAPSULE    Place one capsule into the inhaler and inhale daily for breathing   TRIAMTERENE-HYDROCHLOROTHIAZIDE (MAXZIDE-25) 37.5-25 MG PER TABLET    TAKE 1 TABLET BY MOUTH DAILY TO CONTROL BLOOD PRESSURE   XELJANZ 5 MG TABS    Take 5 mg by mouth daily. For Rheumatoid Arthritis  Modified  Medications   No medications on file  Discontinued Medications   No medications on file     Physical Exam:  Filed Vitals:   12/08/14 1600  BP: 138/76  Pulse: 108  Temp: 98.1 F (36.7 C)  TempSrc: Oral  Resp: 20  Height: 5\' 6"  (1.676 m)  Weight: 267 lb 6.4 oz (121.292 kg)  SpO2: 94%    Physical Exam  Constitutional: She is oriented to person, place, and time. She appears well-developed and well-nourished.  HENT:  Head: Normocephalic and atraumatic.  Right Ear: External ear normal.  Left Ear: External ear normal.  Nose: Nose normal.  Mouth/Throat: No oropharyngeal exudate.  Oropharynx erthmatous, no white patches noted  Eyes: EOM are normal. Pupils are equal, round, and reactive to light. Right conjunctiva is injected. Left conjunctiva is injected.  Cardiovascular: Normal rate, regular rhythm and normal heart sounds.   Pulmonary/Chest: Effort normal and breath sounds normal.  Abdominal: Soft. Bowel sounds are normal. She exhibits no distension. There is no tenderness.  Neurological: She is alert and oriented to person, place, and time.  Skin: Skin is warm and dry.  Psychiatric: She has a normal mood and affect. Her behavior is normal.    Labs reviewed: Basic Metabolic Panel:  Recent Labs  12/18/13 0805 01/16/14 1405 02/10/14 1043  08/01/14 0915 10/23/14 0925 11/03/14 1304  NA 142 142  --   < > 141 139 139  K 4.0 3.9  --   < > 4.4 3.8 4.1  CL 97 99  --   < > 98 96* 102  CO2 28 26  --   < > 25 25 28   GLUCOSE 125* 116*  --   < > 128* 82 148*  BUN 18 21  --   < > 16 14 15   CREATININE 1.04* 1.09*  --   < >  1.06* 0.92 0.93  CALCIUM 9.7 10.0  --   < > 9.5 9.9 9.4  TSH 0.223* 0.114* 3.070  --   --   --   --   < > = values in this interval not displayed. Liver Function Tests:  Recent Labs  01/16/14 1405 08/01/14 0915 10/23/14 0925  AST 25 39 43*  ALT 19 28 31   ALKPHOS 163* 140* 122*  BILITOT 0.3 0.3 0.5  PROT 7.2 6.6 6.7   No results for input(s):  LIPASE, AMYLASE in the last 8760 hours. No results for input(s): AMMONIA in the last 8760 hours. CBC:  Recent Labs  12/18/13 0805 01/16/14 1405 11/03/14 1304  WBC 13.6* 22.5* 18.1*  NEUTROABS 9.9* 17.3*  --   HGB 14.2 14.7 13.7  HCT 44.2 45.5 42.4  MCV 83 82 84.5  PLT  --  363 283   Lipid Panel:  Recent Labs  12/18/13 0805 10/23/14 0925  CHOL 139 131  HDL 30* 33*  LDLCALC 76 72  TRIG 163* 128  CHOLHDL 4.6* 4.0   TSH:  Recent Labs  12/18/13 0805 01/16/14 1405 02/10/14 1043  TSH 0.223* 0.114* 3.070   A1C: Lab Results  Component Value Date   HGBA1C 8.0* 10/23/2014     Assessment/Plan 1. Acute URI -pt already on multiple pain medications, may cont this for sore throat, not to exceed 3 gm tylenol in 24 hours. -mucinex DM for cough and congestion with full glass of water, increase hydration  - doxycycline (VIBRA-TABS) 100 MG tablet; Take 1 tablet (100 mg total) by mouth 2 (two) times daily.  Dispense: 20 tablet; Refill: 0  2. Allergic conjunctivitis, right - olopatadine (PATANOL) 0.1 % ophthalmic solution; Place 1 drop into the right eye 2 (two) times daily. For 1 week  Dispense: 5 mL; Refill: 0  To follow up if fever occurs while on antibiotic and/or if symptoms fail to improve or worsen

## 2014-12-15 ENCOUNTER — Other Ambulatory Visit: Payer: Self-pay | Admitting: *Deleted

## 2014-12-15 MED ORDER — INSULIN LISPRO 200 UNIT/ML ~~LOC~~ SOPN: 20.0000 [IU] | PEN_INJECTOR | Freq: Three times a day (TID) | SUBCUTANEOUS | Status: DC

## 2014-12-15 NOTE — Telephone Encounter (Signed)
Patient requested and faxed to pharmacy 

## 2014-12-23 ENCOUNTER — Other Ambulatory Visit: Payer: Self-pay | Admitting: Internal Medicine

## 2014-12-23 ENCOUNTER — Other Ambulatory Visit: Payer: Self-pay | Admitting: *Deleted

## 2014-12-23 MED ORDER — NYSTATIN-TRIAMCINOLONE 100000-0.1 UNIT/GM-% EX CREA: 1.0000 "application " | TOPICAL_CREAM | Freq: Four times a day (QID) | CUTANEOUS | Status: DC | PRN

## 2014-12-23 NOTE — Telephone Encounter (Signed)
Patient requested. Faxed to pharmacy.  

## 2015-01-01 ENCOUNTER — Ambulatory Visit (INDEPENDENT_AMBULATORY_CARE_PROVIDER_SITE_OTHER): Payer: Medicare Other | Admitting: Internal Medicine

## 2015-01-01 ENCOUNTER — Encounter: Payer: Self-pay | Admitting: Internal Medicine

## 2015-01-01 VITALS — BP 156/60 | HR 68 | Ht 66.0 in | Wt 271.5 lb

## 2015-01-01 DIAGNOSIS — R1310 Dysphagia, unspecified: Secondary | ICD-10-CM | POA: Diagnosis not present

## 2015-01-01 DIAGNOSIS — R197 Diarrhea, unspecified: Secondary | ICD-10-CM | POA: Diagnosis not present

## 2015-01-01 MED ORDER — PANTOPRAZOLE SODIUM 40 MG PO TBEC: 40.0000 mg | DELAYED_RELEASE_TABLET | Freq: Two times a day (BID) | ORAL | Status: DC

## 2015-01-01 MED ORDER — FLUCONAZOLE 100 MG PO TABS: 100.0000 mg | ORAL_TABLET | Freq: Every day | ORAL | Status: DC

## 2015-01-01 NOTE — Patient Instructions (Addendum)
Dr Mariea Clonts.Reed You have been scheduled for an endoscopy and colonoscopy. Please follow the written instructions given to you at your visit today. Please pick up your prep supplies at the pharmacy within the next 1-3 days. If you use inhalers (even only as needed), please bring them with you on the day of your procedure. Your physician has requested that you go to www.[email or web address] and enter the access code given to you at your visit today. This web site gives a general overview about your procedure. However, you should still follow specific instructions given to you by our office regarding your preparation for the procedure. We have sent medications to your pharmacy for you to pick up at your convenience. Increase your pantoprazole to 40 mg twice daily. Refills have been sent.

## 2015-01-01 NOTE — Progress Notes (Signed)
Katherine Delgado [facility] [birth year] [id number]  Note: This dictation was prepared with Dragon digital system. Any transcriptional errors that result from this procedure are unintentional.   History of Present Illness: This is a 74 year old white female with multiple medical problems including thymectomy in 2002 by Dr. Phillips Climes for benign disease. We have seen her in 2006 for gastroesophageal reflux when she underwent upper endoscopy and dilatation. Today complaining of gas, rumbling and soreness substernally when she eats solids or liquids. The symptoms started following acute episode of gastroenteritis in January 2016. She took a course of antibiotics in March 2016 for upper respiratory infection ( Doxycycline). She has Sjogren's syndrome. She is on multiple medications including hydrocodone for rheumatoid arthritis.  She  has odynophagia . Patient had a scree ning colonoscopy more than 10 years ago.Currently having normal bowel movements    Past Medical History  Diagnosis Date  . COPD (chronic obstructive pulmonary disease)   . Hypertension   . RA (rheumatoid arthritis)   . Thyroid disease   . Miscarriage 1962  . History of benign thymus tumor   . Back injury   . GERD (gastroesophageal reflux disease)   . Arthritis   . Fibromyalgia   . Diabetes mellitus without complication   . Thyroid disorder   . Acute infective polyneuritis 2002  . Guillain-Barre syndrome   . Polyneuropathy in diabetes(357.2)   . Morbid obesity   . Spondylosis, lumbosacral   . Degenerative arthritis   . Dyslipidemia   . Spinal stenosis, lumbar region, without neurogenic claudication   . Candidiasis of skin and nails   . Acute maxillary sinusitis   . Insomnia, unspecified   . Candidiasis of vulva and vagina   . Edema   . Acute sinusitis, unspecified   . Obstructive chronic bronchitis with acute bronchitis   . Urinary tract infection, site not specified   . Unspecified hypothyroidism   . Type II or unspecified  type diabetes mellitus with peripheral circulatory disorders, uncontrolled(250.72)   . Mixed hyperlipidemia   . Other specified disease of white blood cells   . Depressive disorder, not elsewhere classified   . Chronic pain syndrome   . Tear film insufficiency, unspecified   . Unspecified essential hypertension   . Allergic rhinitis due to pollen   . Unspecified chronic bronchitis   . Reflux esophagitis   . Diaphragmatic hernia without mention of obstruction or gangrene   . Unspecified pruritic disorder   . Rheumatoid arthritis(714.0)   . Pain in joint, multiple sites   . Stiffness of joints, not elsewhere classified, multiple sites   . Lumbago   . Other malaise and fatigue   . Spontaneous ecchymoses   . Abnormal weight gain   . Shortness of breath   . Cough   . Unspecified urinary incontinence   . Osteopenia, senile     Past Surgical History  Procedure Laterality Date  . Abdominal hysterectomy  1974  . Tonsillectomy    . Cholecystectomy  1984  . Back surgery  1982  . Ovarian cyst surgery  1968  . Thymus tumor    . Knee surgery Bilateral 08/27/2010 (L) and 01/11/2011 (R)  . [place]  . Bronchoscopy  2001  . Abdominal tumor  2002  . Thymus tumor  10/2000  . Appendectomy      Allergies  Allergen Reactions  . Penicillins Hives, Itching, Swelling and Rash  . Sulfamethoxazole-Trimethoprim Itching    Family history and social history have been reviewed.  Review of  Systems: Skin yeast infection under the breast and under the panniculus. Odynophagia. Dysphagia to liquids and solids. The remainder of the 10 point ROS is negative except as outlined in the H&P  Physical Exam: General Appearance Well developed, in no distress, morbidly obese Eyes  Non icteric  HEENT  Non traumatic, normocephalic  Mouth No lesion, tongue papillated, no cheilosis, no thrush Neck Supple without adenopathy, thyroid not enlarged, no carotid bruits, no JVD Lungs Clear to auscultation  bilaterally COR Normal S1, normal S2, regular rhythm, no murmur, quiet precordium post thoracotomy scar Abdomen obese. Candida dermatitis under both breasts and in the groin, soft abdomen , non tender,  normoactive bowel sounds Rectal soft Hemoccult negative stool Extremities  No pedal edema Skin , palmar erythema. Candida dermatitis Neurological Alert and oriented x 3 Psychological Normal mood and affect  Assessment and Plan:   74 yo white female with history of gastroesophageal reflux, now with odynophagia and dysphagia likely due to Candida esophagitis. She also may have a reflux esophagitis. She may have esophageal dysmotility due to diabetes. Sjogren syndrome causing dry mouth as well as  narcotic medications. We will proceed with upper endoscopy and possible dilatation if indicated. We will start Diflucan 100 mg daily for 5 days, increase pantoprazole to 40 mg twice a day Colo-rectal screening. Currently asymptomatic but had acute diarrhea illness 2 months ago. Stool cultures were negative. We will schedule screening colonoscopy.     Katherine Delgado 01/01/2015

## 2015-01-02 ENCOUNTER — Ambulatory Visit: Payer: Medicare Other | Admitting: Internal Medicine

## 2015-01-02 ENCOUNTER — Other Ambulatory Visit: Payer: Self-pay | Admitting: *Deleted

## 2015-01-02 DIAGNOSIS — G9332 Myalgic encephalomyelitis/chronic fatigue syndrome: Secondary | ICD-10-CM

## 2015-01-02 DIAGNOSIS — M069 Rheumatoid arthritis, unspecified: Secondary | ICD-10-CM

## 2015-01-02 DIAGNOSIS — G8929 Other chronic pain: Secondary | ICD-10-CM

## 2015-01-02 DIAGNOSIS — M797 Fibromyalgia: Secondary | ICD-10-CM

## 2015-01-02 DIAGNOSIS — M545 Low back pain: Secondary | ICD-10-CM

## 2015-01-02 DIAGNOSIS — R5382 Chronic fatigue, unspecified: Principal | ICD-10-CM

## 2015-01-02 MED ORDER — HYDROCODONE-ACETAMINOPHEN 10-325 MG PO TABS: ORAL_TABLET | ORAL | Status: DC

## 2015-01-02 MED ORDER — OXYCODONE HCL 5 MG PO TABS: ORAL_TABLET | ORAL | Status: DC

## 2015-01-02 NOTE — Telephone Encounter (Signed)
Patient Requested and will pick up 

## 2015-01-21 ENCOUNTER — Other Ambulatory Visit: Payer: Self-pay | Admitting: Internal Medicine

## 2015-01-22 ENCOUNTER — Ambulatory Visit: Payer: Medicare Other | Admitting: Internal Medicine

## 2015-01-30 ENCOUNTER — Other Ambulatory Visit: Payer: Self-pay

## 2015-01-30 DIAGNOSIS — M797 Fibromyalgia: Secondary | ICD-10-CM

## 2015-01-30 DIAGNOSIS — G9332 Myalgic encephalomyelitis/chronic fatigue syndrome: Secondary | ICD-10-CM

## 2015-01-30 DIAGNOSIS — M069 Rheumatoid arthritis, unspecified: Secondary | ICD-10-CM

## 2015-01-30 DIAGNOSIS — R5382 Chronic fatigue, unspecified: Principal | ICD-10-CM

## 2015-01-30 DIAGNOSIS — G8929 Other chronic pain: Secondary | ICD-10-CM

## 2015-01-30 DIAGNOSIS — M545 Low back pain, unspecified: Secondary | ICD-10-CM

## 2015-01-30 MED ORDER — OXYCODONE HCL 5 MG PO TABS: ORAL_TABLET | ORAL | Status: DC

## 2015-01-30 MED ORDER — HYDROCODONE-ACETAMINOPHEN 10-325 MG PO TABS: ORAL_TABLET | ORAL | Status: DC

## 2015-01-30 NOTE — Telephone Encounter (Signed)
Patient called for refill Oxycondone and Hydrocodone last refill 01/02/15. Done

## 2015-02-11 ENCOUNTER — Encounter: Payer: Self-pay | Admitting: Internal Medicine

## 2015-02-11 ENCOUNTER — Ambulatory Visit (AMBULATORY_SURGERY_CENTER): Payer: Medicare Other | Admitting: Internal Medicine

## 2015-02-11 VITALS — BP 139/55 | HR 80 | Temp 99.4°F | Resp 21 | Ht 66.0 in | Wt 271.0 lb

## 2015-02-11 DIAGNOSIS — R197 Diarrhea, unspecified: Secondary | ICD-10-CM | POA: Diagnosis not present

## 2015-02-11 DIAGNOSIS — D125 Benign neoplasm of sigmoid colon: Secondary | ICD-10-CM

## 2015-02-11 DIAGNOSIS — K635 Polyp of colon: Secondary | ICD-10-CM | POA: Diagnosis not present

## 2015-02-11 DIAGNOSIS — K2970 Gastritis, unspecified, without bleeding: Secondary | ICD-10-CM

## 2015-02-11 DIAGNOSIS — Z1211 Encounter for screening for malignant neoplasm of colon: Secondary | ICD-10-CM

## 2015-02-11 DIAGNOSIS — D179 Benign lipomatous neoplasm, unspecified: Secondary | ICD-10-CM

## 2015-02-11 DIAGNOSIS — R1310 Dysphagia, unspecified: Secondary | ICD-10-CM | POA: Diagnosis not present

## 2015-02-11 DIAGNOSIS — J449 Chronic obstructive pulmonary disease, unspecified: Secondary | ICD-10-CM | POA: Diagnosis not present

## 2015-02-11 DIAGNOSIS — D175 Benign lipomatous neoplasm of intra-abdominal organs: Secondary | ICD-10-CM | POA: Diagnosis not present

## 2015-02-11 DIAGNOSIS — K2950 Unspecified chronic gastritis without bleeding: Secondary | ICD-10-CM | POA: Diagnosis not present

## 2015-02-11 DIAGNOSIS — K2990 Gastroduodenitis, unspecified, without bleeding: Secondary | ICD-10-CM | POA: Diagnosis not present

## 2015-02-11 LAB — GLUCOSE, CAPILLARY
Glucose-Capillary: 167 mg/dL — ABNORMAL HIGH (ref 65–99)
Glucose-Capillary: 146 mg/dL — ABNORMAL HIGH (ref 65–99)

## 2015-02-11 MED ORDER — SODIUM CHLORIDE 0.9 % IV SOLN: 500.0000 mL | INTRAVENOUS | Status: DC

## 2015-02-11 NOTE — Progress Notes (Signed)
A/ox3 pleased with MAC, report to Jane RN 

## 2015-02-11 NOTE — Progress Notes (Signed)
Called to room to assist during endoscopic procedure.  Patient ID and intended procedure confirmed with present staff. Received instructions for my participation in the procedure from the performing physician.  

## 2015-02-11 NOTE — Op Note (Signed)
[place]  [address]. [place], [address]   COLONOSCOPY PROCEDURE REPORT  PATIENT: Katherine Delgado, Katherine Delgado  MR#: [id number] BIRTHDATE: 1941/07/26 , 74  yrs. old GENDER: female ENDOSCOPIST: Lafayette Dragon, MD REFERRED NU:[email or web address] Reed, M.D. PROCEDURE DATE:  02/11/2015 PROCEDURE:   Colonoscopy, screening, Colonoscopy with snare polypectomy, and Colonoscopy with biopsy First Screening Colonoscopy - Avg.  risk and is 50 yrs.  old or older - No.  Prior Negative Screening - Now for repeat screening. 10 or more years since last screening  History of Adenoma - Now for follow-up colonoscopy & has been > or = to 3 yrs.  N/A  Polyps removed today? Yes ASA CLASS:   Class III INDICATIONS:Screening for colonic neoplasia, Colorectal Neoplasm Risk Assessment for this procedure is average risk, and prior colonoscopy more than 10 years ago in another facility.  No records are available. MEDICATIONS: Monitored anesthesia care and Propofol 350 mg IV  DESCRIPTION OF PROCEDURE:   After the risks benefits and alternatives of the procedure were thoroughly explained, informed consent was obtained.  The digital rectal exam revealed no abnormalities of the rectum.   The LB [id number]  endoscope was introduced through the anus and advanced to the cecum, which was identified by both the appendix and ileocecal valve. No adverse events experienced.   The quality of the prep was fair.  (MoviPrep was used)  The instrument was then slowly withdrawn as the colon was fully examined. Estimated blood loss is zero unless otherwise noted in this procedure report.      COLON FINDINGS: A polypoid shaped pedunculated polyp was found in the sigmoid colon.  A polypectomy was performed using snare cautery.  The resection was complete, the polyp tissue was completely retrieved and sent to histology.   Or lipomas in the cecum and ascending colon and transverse colon.  Each measures 20 mm.  The  submucosal.  Biopsies were obtained from one of them. Retroflexed views revealed no abnormalities. The time to cecum = 8.41 Withdrawal time = 14.49   The scope was withdrawn and the procedure completed. COMPLICATIONS: There were no immediate complications.  ENDOSCOPIC IMPRESSION: 1.   Pedunculated polyp was found in the sigmoid colon; polypectomy was performed using snare cautery 2.   Four submucosal  lipomas in the cecum and ascending colon and transverse colon.  Each measures 20 mm.  The submucosal.  Biopsies were obtained from one of them  RECOMMENDATIONS: 1.  Await pathology results 2.  High fiber diet He call colonoscopy pending path report  eSigned:  Lafayette Dragon, MD 02/11/2015 2:55 PM   cc:   PATIENT NAME:  Katherine Delgado MR#: [id number]

## 2015-02-11 NOTE — Op Note (Signed)
[place]  [address]. [place], [address]   ENDOSCOPY PROCEDURE REPORT  PATIENT: Katherine Delgado, Katherine Delgado  MR#: [id number] BIRTHDATE: 17-Dec-1940 , 74  yrs. old GENDER: female ENDOSCOPIST: Lafayette Dragon, MD REFERRED BY:  Hollace Kinnier, M.D. PROCEDURE DATE:  02/11/2015 PROCEDURE:  EGD w/ biopsy ASA CLASS:     Class III INDICATIONS:  Odynophagia, epigastric pain.  Prior endoscopy in 2006 with dilatation.  History of Sjogren syndrome. MEDICATIONS: Monitored anesthesia care and Propofol 200 mg IV TOPICAL ANESTHETIC: none  DESCRIPTION OF PROCEDURE: After the risks benefits and alternatives of the procedure were thoroughly explained, informed consent was obtained.  The LB [id number] endoscope was introduced through the mouth and advanced to the second portion of the duodenum , Without limitations.  The instrument was slowly withdrawn as the mucosa was fully examined.    Esophagus: esophageal mucosa was normal. There was a nonobstructing fibrous ring at the distal esophagus which distal to the ring was a small hiatal hernia which was reducible. There was no evidence of esophagitis  Stomach: There was moderately severe erosive gastritis in the gastric antrum. There were multiple superficial erosions covered with exudate. There was small amount of coffee-ground material, biopsies were obtained to rule out H. pylori. Retroflexion of the endoscope revealed normal fundus and cardia  Duodenum: duodenal bulb and descending duodenum was normal[   ,small bowel biopsies were obtained from second portion of duodenum because of history of diarrhea to rule out sprue       The scope was then withdrawn from the patient and the procedure completed.  COMPLICATIONS: There were no immediate complications.  ENDOSCOPIC IMPRESSION: 1. nonobstructing esophageal ring not dilated 2. Moderately severe erosive gastritis. Status post biopsies. Rule out H. pylori 3. Small bowel  biopsies  RECOMMENDATIONS: 1.  Await pathology results 2.  Continue PPI  REPEAT EXAM: for EGD pending biopsy results.  eSigned:  Lafayette Dragon, MD 02/11/2015 2:50 PM    CC:  PATIENT NAME:  Katherine Delgado, Katherine Delgado MR#: [id number]

## 2015-02-11 NOTE — Patient Instructions (Signed)

## 2015-02-12 ENCOUNTER — Telehealth: Payer: Self-pay

## 2015-02-12 NOTE — Telephone Encounter (Signed)
  Follow up Call-  Call back number 02/11/2015  Post procedure Call Back phone  # [phone]   Permission to leave phone message Yes     Patient questions:  Do you have a fever, pain , or abdominal swelling? No. Pain Score  0 *  Have you tolerated food without any problems? Yes.    Have you been able to return to your normal activities? Yes.    Do you have any questions about your discharge instructions: Diet   No. Medications  No. Follow up visit  No.  Do you have questions or concerns about your Care? No.  Actions: * If pain score is 4 or above: No action needed, pain <4.

## 2015-02-17 ENCOUNTER — Other Ambulatory Visit: Payer: Self-pay | Admitting: Internal Medicine

## 2015-02-17 ENCOUNTER — Encounter: Payer: Self-pay | Admitting: Internal Medicine

## 2015-02-18 DIAGNOSIS — R748 Abnormal levels of other serum enzymes: Secondary | ICD-10-CM | POA: Diagnosis not present

## 2015-02-18 DIAGNOSIS — Z79899 Other long term (current) drug therapy: Secondary | ICD-10-CM | POA: Diagnosis not present

## 2015-02-18 LAB — CBC AND DIFFERENTIAL
HCT: 43 % (ref 36–46)
HEMOGLOBIN: 13.9 g/dL (ref 12.0–16.0)
Platelets: 268 10*3/uL (ref 150–399)
WBC: 12 10*3/mL

## 2015-02-18 LAB — BASIC METABOLIC PANEL
BUN: 21 mg/dL (ref 4–21)
Creatinine: 0.9 mg/dL (ref 0.5–1.1)
GLUCOSE: 217 mg/dL
Potassium: 4.6 mmol/L (ref 3.4–5.3)
Sodium: 141 mmol/L (ref 137–147)

## 2015-02-18 LAB — HEPATIC FUNCTION PANEL
ALT: 33 U/L (ref 7–35)
AST: 55 U/L — AB (ref 13–35)
Alkaline Phosphatase: 128 U/L — AB (ref 25–125)

## 2015-02-19 ENCOUNTER — Encounter: Payer: Self-pay | Admitting: Internal Medicine

## 2015-02-19 ENCOUNTER — Ambulatory Visit (INDEPENDENT_AMBULATORY_CARE_PROVIDER_SITE_OTHER): Payer: Medicare Other | Admitting: Internal Medicine

## 2015-02-19 VITALS — BP 138/78 | HR 83 | Temp 98.0°F | Ht 66.5 in | Wt 271.0 lb

## 2015-02-19 DIAGNOSIS — M170 Bilateral primary osteoarthritis of knee: Secondary | ICD-10-CM

## 2015-02-19 DIAGNOSIS — J449 Chronic obstructive pulmonary disease, unspecified: Secondary | ICD-10-CM

## 2015-02-19 DIAGNOSIS — M069 Rheumatoid arthritis, unspecified: Secondary | ICD-10-CM | POA: Diagnosis not present

## 2015-02-19 DIAGNOSIS — I10 Essential (primary) hypertension: Secondary | ICD-10-CM | POA: Diagnosis not present

## 2015-02-19 DIAGNOSIS — G8929 Other chronic pain: Secondary | ICD-10-CM

## 2015-02-19 DIAGNOSIS — E1142 Type 2 diabetes mellitus with diabetic polyneuropathy: Secondary | ICD-10-CM | POA: Diagnosis not present

## 2015-02-19 DIAGNOSIS — M545 Low back pain, unspecified: Secondary | ICD-10-CM

## 2015-02-19 DIAGNOSIS — R5382 Chronic fatigue, unspecified: Secondary | ICD-10-CM | POA: Diagnosis not present

## 2015-02-19 DIAGNOSIS — J4489 Other specified chronic obstructive pulmonary disease: Secondary | ICD-10-CM

## 2015-02-19 DIAGNOSIS — G9332 Myalgic encephalomyelitis/chronic fatigue syndrome: Secondary | ICD-10-CM

## 2015-02-19 DIAGNOSIS — M797 Fibromyalgia: Secondary | ICD-10-CM

## 2015-02-19 MED ORDER — VENLAFAXINE HCL ER 37.5 MG PO CP24: 37.5000 mg | ORAL_CAPSULE | Freq: Every day | ORAL | Status: DC

## 2015-02-19 MED ORDER — LIDOCAINE 5 % EX PTCH: MEDICATED_PATCH | CUTANEOUS | Status: DC

## 2015-02-19 NOTE — Progress Notes (Signed)
Patient ID: Katherine Delgado, female   DOB: February 04, 1941, 74 y.o.   MRN: [id number]   Location:  [facility] / [facility]  Code Status: full code Goals of Care: Advanced Directive information Does patient have an advance directive?: Yes, Type of Advance Directive: [place];Living will, Does patient want to make changes to advanced directive?: No - Patient declined   Chief Complaint  Patient presents with  . Medical Management of Chronic Issues    3 month follow-up, no recent labs (not fasting today). [facility] (will have copy faxed)   . Medication Management    Discuss if patient can restart Lidocaine patch  . Immunizations    Patient refused all recommended immunizations today, patient will consider at a later date     HPI: Patient is a 74 y.o. white female seen in the office today for med mgt of her chronic diseases.  Knees are acting up these days.  She requests to restart the lidoderm patches for her knees.  She is so tired of all of the pain.    Had her EGD and cscope with biopsies.  Had one fairly large polyp.  Says there were fatty deposits.  Still has her hiatal hernia and may have some gastritis.  Pathology pending.  Cont PPI.  Spirits are bad today.  Is tired of being in pain all of the time. Had been on effexor several years ago, but this was stopped in exchange for cymbalta which did not work.  Morrie Sheldon has helped her RA pain.  The back and fibromyalgia and sjogren's are still hurting.  Gabapentin is not doing anything for the nerve pain, she says.  Says her oxycodone and hydrocodone work more on different areas of pain.   DMII:  Hasn't had labs and seen [place] lately.  Has had some high sugars.  Prep for cscope made them low.  Was 131 fasting this am.    Review of Systems:  Review of Systems  Constitutional: Positive for malaise/fatigue. Negative for fever and chills.  HENT: Negative for  congestion and hearing loss.   Eyes: Negative for blurred vision.  Respiratory: Positive for cough, sputum production and wheezing. Negative for shortness of breath.   Cardiovascular: Positive for leg swelling. Negative for chest pain.  Gastrointestinal: Negative for heartburn, abdominal pain, constipation, blood in stool and melena.  Genitourinary: Negative for dysuria, urgency and frequency.  Musculoskeletal: Positive for myalgias, back pain and joint pain. Negative for falls.  Skin: Negative for rash.  Neurological: Negative for loss of consciousness and headaches.  Psychiatric/Behavioral: Positive for depression. Negative for memory loss. The patient has insomnia.     Past Medical History  Diagnosis Date  . COPD (chronic obstructive pulmonary disease)   . Hypertension   . RA (rheumatoid arthritis)   . Thyroid disease   . Miscarriage 1962  . History of benign thymus tumor   . Back injury   . GERD (gastroesophageal reflux disease)   . Arthritis   . Fibromyalgia   . Diabetes mellitus without complication   . Thyroid disorder   . Acute infective polyneuritis 2002  . Guillain-Barre syndrome   . Polyneuropathy in diabetes(357.2)   . Morbid obesity   . Spondylosis, lumbosacral   . Degenerative arthritis   . Dyslipidemia   . Spinal stenosis, lumbar region, without neurogenic claudication   . Candidiasis of skin and nails   . Acute maxillary sinusitis   . Insomnia, unspecified   .  Candidiasis of vulva and vagina   . Edema   . Acute sinusitis, unspecified   . Obstructive chronic bronchitis with acute bronchitis   . Urinary tract infection, site not specified   . Unspecified hypothyroidism   . Type II or unspecified type diabetes mellitus with peripheral circulatory disorders, uncontrolled(250.72)   . Mixed hyperlipidemia   . Other specified disease of white blood cells   . Depressive disorder, not elsewhere classified   . Chronic pain syndrome   . Tear film insufficiency,  unspecified   . Unspecified essential hypertension   . Allergic rhinitis due to pollen   . Unspecified chronic bronchitis   . Reflux esophagitis   . Diaphragmatic hernia without mention of obstruction or gangrene   . Unspecified pruritic disorder   . Rheumatoid arthritis(714.0)   . Pain in joint, multiple sites   . Stiffness of joints, not elsewhere classified, multiple sites   . Lumbago   . Other malaise and fatigue   . Spontaneous ecchymoses   . Abnormal weight gain   . Shortness of breath   . Cough   . Unspecified urinary incontinence   . Osteopenia, senile   . Osteoporosis     Past Surgical History  Procedure Laterality Date  . Abdominal hysterectomy  1974  . Tonsillectomy    . Cholecystectomy  1984  . Back surgery  1982  . Ovarian cyst surgery  1968  . Thymus tumor    . Knee surgery Bilateral 08/27/2010 (L) and 01/11/2011 (R)  . [place]  . Bronchoscopy  2001  . Abdominal tumor  2002  . Thymus tumor  10/2000  . Appendectomy      Allergies  Allergen Reactions  . Penicillins Hives, Itching, Swelling and Rash  . Sulfamethoxazole-Trimethoprim Itching   Medications: Patient's Medications  New Prescriptions   LIDOCAINE (LIDODERM) 5 %    Apply patch to bilateral knees at bedtime.  Remove & Discard patch within 12 hours or as directed by MD   VENLAFAXINE XR (EFFEXOR-XR) 37.5 MG 24 HR CAPSULE    Take 1 capsule (37.5 mg total) by mouth daily with breakfast.  Previous Medications   ASPIRIN EC 81 MG TABLET    Take 81 mg by mouth daily as needed.    CHOLECALCIFEROL 2000 UNITS TABS    Take 2,000 Units by mouth daily.   DM-PHENYLEPHRINE-ACETAMINOPHEN 10-5-325 MG CAPS    Take 1 capsule by mouth daily as needed (for cold).   FUROSEMIDE (LASIX) 20 MG TABLET    Take 1 tablet (20 mg total) by mouth 2 (two) times daily as needed. Use as needed for 3 pounds gain in one day or 5 pounds in one week   GABAPENTIN (NEURONTIN) 600 MG TABLET    TAKE 1 TABLET BY MOUTH THREE TIMES  DAILY   GLUCOSE BLOOD (ACCU-CHEK SMARTVIEW) TEST STRIP    Use as instructed   HYDROCODONE-ACETAMINOPHEN (NORCO) 10-325 MG PER TABLET    Take one tablet every three hours as needed for pain   INSULIN LISPRO, HUMAN, (HUMALOG KWIKPEN) 200 UNIT/ML SOPN    Inject 20 Units into the skin 3 (three) times daily with meals.   INSULIN PEN NEEDLE 31G X 5 MM MISC    Use with Insulin pen as instructed. Dx E11.59   INSULIN SYRINGE-NEEDLE U-100 31G X 5/16" 1 ML MISC    Use once daily with administration of Insulin DX E11.59   LANTUS 100 UNIT/ML INJECTION    INJECT 90 UNITS INTO THE SKIN  DAILY. SPLIT INTO 2 INJECTIONS OF 45 UNITS TO IMPROVE ABSORPTION   LEVOTHYROXINE (SYNTHROID, LEVOTHROID) 175 MCG TABLET    TAKE 1 TABLET BY MOUTH DAILY BEFORE BREAKFAST   LIRAGLUTIDE (VICTOZA) 18 MG/3ML SOPN    Inject 1.8 mg into the skin daily. To control blood sugar   LISINOPRIL (PRINIVIL,ZESTRIL) 5 MG TABLET    Take one tablet by mouth twice daily to control blood pressure   LOPERAMIDE (IMODIUM A-D) 2 MG TABLET    Take 2 mg by mouth 4 (four) times daily as needed for diarrhea or loose stools.   NYSTATIN (NYAMYC) 100000 UNIT/GM POWD    Apply 1 g topically daily as needed (for itching).   NYSTATIN-TRIAMCINOLONE (MYCOLOG II) CREAM    Apply 1 application topically 4 (four) times daily as needed (for rash). Apply under abdomen folds twice daily until rash healed   OMEGA-3 ACID ETHYL ESTERS (LOVAZA) 1 G CAPSULE    TAKE 1 CAPSULE BY MOUTH EVERY DAY   OXYCODONE (OXY IR/ROXICODONE) 5 MG IMMEDIATE RELEASE TABLET    Take one tablet every 6 hours as needed for severe pain, Take 2 tablets at bedtime.   PANTOPRAZOLE (PROTONIX) 40 MG TABLET    Take 1 tablet (40 mg total) by mouth 2 (two) times daily.   PILOCARPINE (SALAGEN) 5 MG TABLET    Take 5 mg by mouth daily. For dry mouth   POLYVINYL ALCOHOL (LIQUIFILM TEARS) 1.4 % OPHTHALMIC SOLUTION    Place 1 drop into both eyes as needed (for dry eyes).    POTASSIUM CHLORIDE (MICRO-K) 10 MEQ CR  CAPSULE    TAKE ONE CAPSULE BY MOUTH TWICE DAILY   PROMETHAZINE (PHENERGAN) 12.5 MG TABLET    TAKE 1 TABLET BY MOUTH AS NEEDED FOR SEVERE NAUSEA AS DIRECTED   TIOTROPIUM (SPIRIVA HANDIHALER) 18 MCG INHALATION CAPSULE    Place one capsule into the inhaler and inhale daily for breathing   TRIAMTERENE-HYDROCHLOROTHIAZIDE (MAXZIDE-25) 37.5-25 MG PER TABLET    TAKE 1 TABLET BY MOUTH DAILY TO CONTROL BLOOD PRESSURE   XELJANZ 5 MG TABS    Take 5 mg by mouth daily. For Rheumatoid Arthritis  Modified Medications   No medications on file  Discontinued Medications   DOXYCYCLINE (VIBRA-TABS) 100 MG TABLET    Take 1 tablet (100 mg total) by mouth 2 (two) times daily.   DULOXETINE (CYMBALTA) 60 MG CAPSULE    Take 1 capsule (60 mg total) by mouth 2 (two) times daily.   GUAIFENESIN (MUCINEX) 600 MG 12 HR TABLET    Take 1 tablet (600 mg total) by mouth 2 (two) times daily as needed for cough or to loosen phlegm.   LIDOCAINE (LIDODERM) 5 %    Place 1 patch onto the skin as needed.   OLOPATADINE (PATANOL) 0.1 % OPHTHALMIC SOLUTION    Place 1 drop into the right eye 2 (two) times daily. For 1 week    Physical Exam: Filed Vitals:   02/19/15 0959  BP: 138/78  Pulse: 83  Temp: 98 F (36.7 C)  TempSrc: Oral  Height: 5' 6.5" (1.689 m)  Weight: 271 lb (122.925 kg)  SpO2: 95%   Physical Exam  Constitutional: She is oriented to person, place, and time. She appears well-developed and well-nourished. No distress.  HENT:  Head: Normocephalic and atraumatic.  Cardiovascular: Normal rate, regular rhythm, normal heart sounds and intact distal pulses.   Pulmonary/Chest: Effort normal. No respiratory distress. She has wheezes. She has no rales.  Abdominal: Soft. Bowel sounds are  normal. She exhibits no distension. There is no tenderness.  Musculoskeletal: Normal range of motion. She exhibits tenderness.  Walks with cane; tenderness of bilateral knees  Neurological: She is alert and oriented to person, place, and  time.  Skin: Skin is warm and dry.  Psychiatric: She has a normal mood and affect. Her behavior is normal.    Labs reviewed: Basic Metabolic Panel:  Recent Labs  08/01/14 0915 10/23/14 0925 11/03/14 1304  NA 141 139 139  K 4.4 3.8 4.1  CL 98 96* 102  CO2 25 25 28   GLUCOSE 128* 82 148*  BUN 16 14 15   CREATININE 1.06* 0.92 0.93  CALCIUM 9.5 9.9 9.4   Liver Function Tests:  Recent Labs  08/01/14 0915 10/23/14 0925  AST 39 43*  ALT 28 31  ALKPHOS 140* 122*  BILITOT 0.3 0.5  PROT 6.6 6.7   No results for input(s): LIPASE, AMYLASE in the last 8760 hours. No results for input(s): AMMONIA in the last 8760 hours. CBC:  Recent Labs  11/03/14 1304  WBC 18.1*  HGB 13.7  HCT 42.4  MCV 84.5  PLT 283   Lipid Panel:  Recent Labs  10/23/14 0925  CHOL 131  HDL 33*  LDLCALC 72  TRIG 128  CHOLHDL 4.0   Lab Results  Component Value Date   HGBA1C 8.0* 10/23/2014   Assessment/Plan 1. Chronic fatigue fibromyalgia syndrome -we decided to restart an antidepressant today due to worsening moods and pain--pt feels that effexor helped her before - venlafaxine XR (EFFEXOR-XR) 37.5 MG 24 hr capsule; Take 1 capsule (37.5 mg total) by mouth daily with breakfast.  Dispense: 30 capsule; Refill: 3 -advised to monitor her bp at home and keep a record b/c it can run up her pressure -unable to exercise she says due to pain though this is one of the best treatments for her fibro  2. Chronic low back pain -longstanding, DDD, cont hydrocodone and oxycodone for this--says they work differently for her -eventually, I would like to get her on only one narcotic  3. Rheumatoid arthritis -continues on xeljanz which has helped with this pain  4. Type 2 diabetes mellitus with diabetic polyneuropathy -cont current treatments--victoza should help with weight loss, but she has gained her 5 lbs back -advised to f/u with Tivis Ringer after labs in one month to check up on her DMII -  Hemoglobin A1c; Future - Lipid panel; Future - Basic metabolic panel; Future -also says that gabapentin isn't doing anything (i suspect it does but she's unaware so hesitant to stop it for fear she'll have worse pain and end up on more narcotics)  5. Obstructive chronic bronchitis without exacerbation -cont current treatment, seems she is stable at present without a flare--sputum and cough not changed  6. Essential hypertension -bp fair at home, today, no changes - Basic metabolic panel; Future  7. Primary osteoarthritis of both knees -requests her lidoderm be restarted to help her knee pain which has been worse recently. - lidocaine (LIDODERM) 5 %; Apply patch to bilateral knees at bedtime.  Remove & Discard patch within 12 hours or as directed by MD  Dispense: 60 patch; Refill: 3  Labs/tests ordered:   Orders Placed This Encounter  Procedures  . Hemoglobin A1c    Standing Status: Future     Number of Occurrences:      Standing Expiration Date: 05/22/2015  . Lipid panel    Standing Status: Future     Number of Occurrences:  Standing Expiration Date: 05/22/2015    Order Specific Question:  Has the patient fasted?    Answer:  Yes  . Basic metabolic panel    Standing Status: Future     Number of Occurrences:      Standing Expiration Date: 05/22/2015    Order Specific Question:  Has the patient fasted?    Answer:  Yes    Next appt:  3 mos with me, 1 mo with [place], labs before  [place] Loveda Colaizzi, D.O. [facility] [address], [place] [address] Cell Phone (Mon-Fri 8am-5pm):  [phone] On Call:  [phone] & follow prompts after 5pm & weekends Office Phone:  [phone] Office Fax:  [phone]

## 2015-02-19 NOTE — Patient Instructions (Signed)
Check your blood pressure daily at least one hour after the effexor each morning.  If it is staying over 140/90, let me know.

## 2015-02-20 ENCOUNTER — Other Ambulatory Visit: Payer: Self-pay

## 2015-02-20 DIAGNOSIS — M170 Bilateral primary osteoarthritis of knee: Secondary | ICD-10-CM

## 2015-02-20 MED ORDER — LIDOCAINE 5 % EX PTCH: MEDICATED_PATCH | CUTANEOUS | Status: DC

## 2015-02-20 NOTE — Telephone Encounter (Signed)
[facility] fax request refill

## 2015-02-25 ENCOUNTER — Telehealth: Payer: Self-pay | Admitting: *Deleted

## 2015-02-25 NOTE — Telephone Encounter (Signed)
Called patient to inform her that the prior authorization has been verified until 08/22/2015. Informed her to call the pharmacy to check and see if it is ready.

## 2015-02-27 ENCOUNTER — Other Ambulatory Visit: Payer: Self-pay | Admitting: *Deleted

## 2015-02-27 DIAGNOSIS — M797 Fibromyalgia: Secondary | ICD-10-CM

## 2015-02-27 DIAGNOSIS — M545 Low back pain, unspecified: Secondary | ICD-10-CM

## 2015-02-27 DIAGNOSIS — R5382 Chronic fatigue, unspecified: Secondary | ICD-10-CM

## 2015-02-27 DIAGNOSIS — G8929 Other chronic pain: Secondary | ICD-10-CM

## 2015-02-27 DIAGNOSIS — M069 Rheumatoid arthritis, unspecified: Secondary | ICD-10-CM

## 2015-02-27 DIAGNOSIS — G9332 Myalgic encephalomyelitis/chronic fatigue syndrome: Secondary | ICD-10-CM

## 2015-02-27 MED ORDER — OXYCODONE HCL 5 MG PO TABS: ORAL_TABLET | ORAL | Status: DC

## 2015-02-27 MED ORDER — HYDROCODONE-ACETAMINOPHEN 10-325 MG PO TABS: ORAL_TABLET | ORAL | Status: DC

## 2015-03-04 ENCOUNTER — Other Ambulatory Visit: Payer: Self-pay | Admitting: Nurse Practitioner

## 2015-03-16 ENCOUNTER — Other Ambulatory Visit: Payer: Medicare Other

## 2015-03-16 ENCOUNTER — Other Ambulatory Visit: Payer: Self-pay | Admitting: Internal Medicine

## 2015-03-16 DIAGNOSIS — I10 Essential (primary) hypertension: Secondary | ICD-10-CM

## 2015-03-16 DIAGNOSIS — E1142 Type 2 diabetes mellitus with diabetic polyneuropathy: Secondary | ICD-10-CM

## 2015-03-17 LAB — HEMOGLOBIN A1C
Est. average glucose Bld gHb Est-mCnc: 160 mg/dL
Hgb A1c MFr Bld: 7.2 % — ABNORMAL HIGH (ref 4.8–5.6)

## 2015-03-17 LAB — BASIC METABOLIC PANEL
BUN/Creatinine Ratio: 19 (ref 11–26)
BUN: 16 mg/dL (ref 8–27)
CO2: 25 mmol/L (ref 18–29)
Calcium: 9.5 mg/dL (ref 8.7–10.3)
Chloride: 98 mmol/L (ref 97–108)
Creatinine, Ser: 0.86 mg/dL (ref 0.57–1.00)
GFR calc Af Amer: 77 mL/min/{1.73_m2} (ref >59)
GFR calc non Af Amer: 67 mL/min/{1.73_m2} (ref >59)
Glucose: 89 mg/dL (ref 65–99)
Potassium: 4.2 mmol/L (ref 3.5–5.2)
Sodium: 142 mmol/L (ref 134–144)

## 2015-03-17 LAB — LIPID PANEL
Chol/HDL Ratio: 3.9 ratio units (ref 0.0–4.4)
Cholesterol, Total: 148 mg/dL (ref 100–199)
HDL: 38 mg/dL — ABNORMAL LOW (ref >39)
LDL Calculated: 89 mg/dL (ref 0–99)
Triglycerides: 104 mg/dL (ref 0–149)
VLDL Cholesterol Cal: 21 mg/dL (ref 5–40)

## 2015-03-19 ENCOUNTER — Ambulatory Visit (INDEPENDENT_AMBULATORY_CARE_PROVIDER_SITE_OTHER): Payer: Medicare Other | Admitting: Internal Medicine

## 2015-03-19 ENCOUNTER — Encounter: Payer: Self-pay | Admitting: Internal Medicine

## 2015-03-19 VITALS — BP 132/72 | HR 80 | Temp 98.1°F | Resp 20 | Ht 67.0 in | Wt 266.8 lb

## 2015-03-19 DIAGNOSIS — E1169 Type 2 diabetes mellitus with other specified complication: Secondary | ICD-10-CM | POA: Diagnosis not present

## 2015-03-19 DIAGNOSIS — G4700 Insomnia, unspecified: Secondary | ICD-10-CM | POA: Diagnosis not present

## 2015-03-19 DIAGNOSIS — R740 Nonspecific elevation of levels of transaminase and lactic acid dehydrogenase [LDH]: Secondary | ICD-10-CM

## 2015-03-19 DIAGNOSIS — R7401 Elevation of levels of liver transaminase levels: Secondary | ICD-10-CM

## 2015-03-19 DIAGNOSIS — E785 Hyperlipidemia, unspecified: Secondary | ICD-10-CM

## 2015-03-19 NOTE — Progress Notes (Signed)
Patient ID: Katherine Delgado, female   DOB: 07/12/1941, 74 y.o.   MRN: [id number]   Location:  [facility] Goals of Care: Advanced Directive information Does patient have an advance directive?: Yes, Type of Advance Directive: [place];Living will, Does patient want to make changes to advanced directive?: No - Patient declined   Chief Complaint  Patient presents with  . Acute Visit    elevated transaminases on labs at rheumatology    HPI: Patient is a 74 y.o.  seen in the office today for acute visit due to transaminitis seen on her rheumatology labs 6/29. and to review labs from here   Reviewing they were a little elevated in March, as well, but have trended up since.  She's had a cholecystectomy.  She does drink alcohol.  Has not taken more hydrocodone/apap than prescribed.  Occasionally takes a cold medication and alka seltzer plus cold.  Total from hydrocodone was less than 3g.  Only rheum med from Dr. Estanislado Pandy is Morrie Sheldon.  Discussed oxycodone also having some liver and renal metabolism.    LDL is only slightly elevated.  No having liver enzymes elevation so will hold off on meds, but consider zetia when straightened out.  Sleeping better with melatonin.  Did have to go up to 57m.  Does not get that hangover effect that sleeping pills cause.  Review of Systems:  Review of Systems  Constitutional: Negative for fever and chills.  Respiratory: Negative for shortness of breath.   Cardiovascular: Negative for chest pain.  Gastrointestinal: Negative for heartburn, nausea, vomiting, abdominal pain, diarrhea, constipation, blood in stool and melena.  Musculoskeletal: Positive for myalgias and back pain. Negative for falls.  Psychiatric/Behavioral: Positive for depression. The patient is nervous/anxious and has insomnia.        Improved at present    Past Medical History  Diagnosis Date  . COPD (chronic obstructive pulmonary  disease)   . Hypertension   . RA (rheumatoid arthritis)   . Thyroid disease   . Miscarriage 1962  . History of benign thymus tumor   . Back injury   . GERD (gastroesophageal reflux disease)   . Arthritis   . Fibromyalgia   . Diabetes mellitus without complication   . Thyroid disorder   . Acute infective polyneuritis 2002  . Guillain-Barre syndrome   . Polyneuropathy in diabetes(357.2)   . Morbid obesity   . Spondylosis, lumbosacral   . Degenerative arthritis   . Dyslipidemia   . Spinal stenosis, lumbar region, without neurogenic claudication   . Candidiasis of skin and nails   . Acute maxillary sinusitis   . Insomnia, unspecified   . Candidiasis of vulva and vagina   . Edema   . Acute sinusitis, unspecified   . Obstructive chronic bronchitis with acute bronchitis   . Urinary tract infection, site not specified   . Unspecified hypothyroidism   . Type II or unspecified type diabetes mellitus with peripheral circulatory disorders, uncontrolled(250.72)   . Mixed hyperlipidemia   . Other specified disease of white blood cells   . Depressive disorder, not elsewhere classified   . Chronic pain syndrome   . Tear film insufficiency, unspecified   . Unspecified essential hypertension   . Allergic rhinitis due to pollen   . Unspecified chronic bronchitis   . Reflux esophagitis   . Diaphragmatic hernia without mention of obstruction or gangrene   . Unspecified pruritic disorder   . Rheumatoid arthritis(714.0)   .  Pain in joint, multiple sites   . Stiffness of joints, not elsewhere classified, multiple sites   . Lumbago   . Other malaise and fatigue   . Spontaneous ecchymoses   . Abnormal weight gain   . Shortness of breath   . Cough   . Unspecified urinary incontinence   . Osteopenia, senile   . Osteoporosis     Past Surgical History  Procedure Laterality Date  . Abdominal hysterectomy  1974  . Tonsillectomy    . Cholecystectomy  1984  . Back surgery  1982  . Ovarian  cyst surgery  1968  . Thymus tumor    . Knee surgery Bilateral 08/27/2010 (L) and 01/11/2011 (R)  . [place]  . Bronchoscopy  2001  . Abdominal tumor  2002  . Thymus tumor  10/2000  . Appendectomy      Allergies  Allergen Reactions  . Penicillins Hives, Itching, Swelling and Rash  . Sulfamethoxazole-Trimethoprim Itching   Medications: Patient's Medications  New Prescriptions   No medications on file  Previous Medications   ASPIRIN EC 81 MG TABLET    Take 81 mg by mouth daily as needed.    CHOLECALCIFEROL 2000 UNITS TABS    Take 2,000 Units by mouth daily.   DM-PHENYLEPHRINE-ACETAMINOPHEN 10-5-325 MG CAPS    Take 1 capsule by mouth daily as needed (for cold).   FUROSEMIDE (LASIX) 20 MG TABLET    Take 1 tablet (20 mg total) by mouth 2 (two) times daily as needed. Use as needed for 3 pounds gain in one day or 5 pounds in one week   GABAPENTIN (NEURONTIN) 600 MG TABLET    TAKE 1 TABLET BY MOUTH THREE TIMES DAILY   GLUCOSE BLOOD (ACCU-CHEK SMARTVIEW) TEST STRIP    Use as instructed   HUMALOG KWIKPEN 200 UNIT/ML SOPN    INJECT 20 UNITS THREE TIMES DAILY WITH MEALS   HYDROCODONE-ACETAMINOPHEN (NORCO) 10-325 MG PER TABLET    Take one tablet every three hours as needed for pain   INSULIN PEN NEEDLE 31G X 5 MM MISC    Use with Insulin pen as instructed. Dx E11.59   INSULIN SYRINGE-NEEDLE U-100 31G X 5/16" 1 ML MISC    Use once daily with administration of Insulin DX E11.59   LANTUS 100 UNIT/ML INJECTION    INJECT 90 UNITS INTO THE SKIN DAILY. SPLIT INTO 2 INJECTIONS OF 45 UNITS TO IMPROVE ABSORPTION   LEVOTHYROXINE (SYNTHROID, LEVOTHROID) 175 MCG TABLET    TAKE 1 TABLET BY MOUTH DAILY BEFORE BREAKFAST   LIDOCAINE (LIDODERM) 5 %    Apply patch to bilateral knees at bedtime.  Remove & Discard patch within 12 hours or as directed by MD   LISINOPRIL (PRINIVIL,ZESTRIL) 5 MG TABLET    Take one tablet by mouth twice daily to control blood pressure   LOPERAMIDE (IMODIUM A-D) 2 MG TABLET     Take 2 mg by mouth 4 (four) times daily as needed for diarrhea or loose stools.   MELATONIN 10 MG CAPS    Take 1 capsule by mouth at bedtime   NYSTATIN (NYAMYC) 100000 UNIT/GM POWD    Apply 1 g topically daily as needed (for itching).   NYSTATIN-TRIAMCINOLONE (MYCOLOG II) CREAM    Apply 1 application topically 4 (four) times daily as needed (for rash). Apply under abdomen folds twice daily until rash healed   OMEGA-3 ACID ETHYL ESTERS (LOVAZA) 1 G CAPSULE    TAKE 1 CAPSULE BY MOUTH EVERY DAY  OXYCODONE (OXY IR/ROXICODONE) 5 MG IMMEDIATE RELEASE TABLET    Take one tablet every 6 hours as needed for severe pain, Take 2 tablets at bedtime.   PANTOPRAZOLE (PROTONIX) 40 MG TABLET    Take 1 tablet (40 mg total) by mouth 2 (two) times daily.   PILOCARPINE (SALAGEN) 5 MG TABLET    Take 5 mg by mouth daily. For dry mouth   POLYVINYL ALCOHOL (LIQUIFILM TEARS) 1.4 % OPHTHALMIC SOLUTION    Place 1 drop into both eyes as needed (for dry eyes).    POTASSIUM CHLORIDE (MICRO-K) 10 MEQ CR CAPSULE    TAKE ONE CAPSULE BY MOUTH TWICE DAILY   PROMETHAZINE (PHENERGAN) 12.5 MG TABLET    TAKE 1 TABLET BY MOUTH AS NEEDED FOR SEVERE NAUSEA AS DIRECTED   TIOTROPIUM (SPIRIVA HANDIHALER) 18 MCG INHALATION CAPSULE    Place one capsule into the inhaler and inhale daily for breathing   TRIAMTERENE-HYDROCHLOROTHIAZIDE (MAXZIDE-25) 37.5-25 MG PER TABLET    TAKE 1 TABLET BY MOUTH DAILY TO CONTROL BLOOD PRESSURE   VENLAFAXINE XR (EFFEXOR-XR) 37.5 MG 24 HR CAPSULE    Take 1 capsule (37.5 mg total) by mouth daily with breakfast.   VICTOZA 18 MG/3ML SOPN    INJECT 1.8 MG UNDER THE SKIN DAILY TO CONTROL BLOOD SUGAR   XELJANZ 5 MG TABS    Take 5 mg by mouth daily. For Rheumatoid Arthritis  Modified Medications   No medications on file  Discontinued Medications   No medications on file    Physical Exam: Filed Vitals:   03/19/15 1509  BP: 132/72  Pulse: 80  Temp: 98.1 F (36.7 C)  TempSrc: Oral  Resp: 20  Height: 5' 7"  (1.702  m)  Weight: 266 lb 12.8 oz (121.02 kg)  SpO2: 96%   Physical Exam  Constitutional: She is oriented to person, place, and time. She appears well-developed and well-nourished. No distress.  Obese white female  Cardiovascular: Normal rate, regular rhythm, normal heart sounds and intact distal pulses.   Pulmonary/Chest: Effort normal and breath sounds normal. No respiratory distress. She has no wheezes.  Abdominal: Soft. Bowel sounds are normal. She exhibits no distension. There is no tenderness.  Musculoskeletal: Normal range of motion.  Walking w/o assistive device today  Neurological: She is alert and oriented to person, place, and time.  Skin: Skin is warm and dry.    Labs reviewed: Basic Metabolic Panel:  Recent Labs  10/23/14 0925 11/03/14 1304 03/16/15 0832  NA 139 139 142  K 3.8 4.1 4.2  CL 96* 102 98  CO2 25 28 25   GLUCOSE 82 148* 89  BUN 14 15 16   CREATININE 0.92 0.93 0.86  CALCIUM 9.9 9.4 9.5   Liver Function Tests:  Recent Labs  08/01/14 0915 10/23/14 0925  AST 39 43*  ALT 28 31  ALKPHOS 140* 122*  BILITOT 0.3 0.5  PROT 6.6 6.7   No results for input(s): LIPASE, AMYLASE in the last 8760 hours. No results for input(s): AMMONIA in the last 8760 hours. CBC:  Recent Labs  11/03/14 1304  WBC 18.1*  HGB 13.7  HCT 42.4  MCV 84.5  PLT 283   Lipid Panel:  Recent Labs  10/23/14 0925 03/16/15 0832  CHOL 131 148  HDL 33* 38*  LDLCALC 72 89  TRIG 128 104  CHOLHDL 4.0 3.9   Lab Results  Component Value Date   HGBA1C 7.2* 03/16/2015    Procedures since last visit: Labs reviewed from Dr. Nathanial Rancher abstraction: Reviewed  pt's labs from here with her, as well  Assessment/Plan 1. Transaminitis -suspect this is due to use of tylenol containing products along with her hydrocodone/apap (she's at max apap use already with nothing else) and oxycodone use -has f/u labs in August already with Dr. Estanislado Pandy, she says -advised to cut out all other  products containing tylenol/acetaminophen so she does not overdose and will see if AST and alk phos improve -ALT is normal -reviewed other medications and they do not appear to cause transaminitis  2. Hyperlipidemia associated with type 2 diabetes mellitus -goal LDL <70 due to her diabetes -discussed treating this with medication, probably zetia, but holding off due to liver abnormality at present  3. Insomnia -improved with melatonin use--continue  Labs/tests ordered:  Keep f/u labs with Dr. Estanislado Pandy and asked pt to request they be sent to me as well Next appt:  Keep as scheduled  Powell Halbert L. Datha Kissinger, D.O. [facility] [address], [place] [address] Cell Phone (Mon-Fri 8am-5pm):  [phone] On Call:  [phone] & follow prompts after 5pm & weekends Office Phone:  [phone] Office Fax:  [phone]

## 2015-03-19 NOTE — Patient Instructions (Signed)
Lay off any cold medicines or other over the counter medications with tylenol (acetaminophen) because I suspect this is why your liver enzymes are elevated.  Make sure Dr. Estanislado Pandy sends me your next set of labs.

## 2015-03-23 ENCOUNTER — Ambulatory Visit (INDEPENDENT_AMBULATORY_CARE_PROVIDER_SITE_OTHER): Payer: Medicare Other | Admitting: Pharmacotherapy

## 2015-03-23 ENCOUNTER — Encounter: Payer: Self-pay | Admitting: Pharmacotherapy

## 2015-03-23 ENCOUNTER — Encounter: Payer: Self-pay | Admitting: *Deleted

## 2015-03-23 VITALS — BP 170/78 | HR 107 | Temp 98.1°F | Resp 20 | Ht 67.0 in | Wt 262.4 lb

## 2015-03-23 DIAGNOSIS — E1142 Type 2 diabetes mellitus with diabetic polyneuropathy: Secondary | ICD-10-CM

## 2015-03-23 DIAGNOSIS — I10 Essential (primary) hypertension: Secondary | ICD-10-CM

## 2015-03-23 MED ORDER — INSULIN LISPRO 200 UNIT/ML ~~LOC~~ SOPN: 20.0000 [IU] | PEN_INJECTOR | Freq: Three times a day (TID) | SUBCUTANEOUS | Status: DC

## 2015-03-23 MED ORDER — LISINOPRIL 10 MG PO TABS: 10.0000 mg | ORAL_TABLET | Freq: Two times a day (BID) | ORAL | Status: DC

## 2015-03-23 NOTE — Patient Instructions (Signed)
Good improvement!!  Increase Lisinopril 10 mg twice daily  Decrease Humalog to 20 units breakfast, 14 units lunch, and 20 units supper

## 2015-03-23 NOTE — Progress Notes (Signed)
  Subjective:    Katherine Delgado is a 74 y.o.white female who presents for follow-up of Type 2 diabetes mellitus.   A1C now 7.2% (was 8.7%) Forgot to bring blood glucose meter. Self reports her BG 140-150 range. Has had a few hypoglycemia episodes - 35m/dl is her lowest BG. The hypoglycemia occurs early evening.  She struggles with making healthy food choices.  She does skip breakfast at times. No routine exercise. No longer using cane or walker for assist. Some pain in her feet - she feels it is more arthritis related. Denies problems with vision.  Requires reading glasses. Nocturia 1-2 times per night. Some peripheral edema.  She did take her BP medication.  Review of Systems A comprehensive review of systems was negative except for: Constitutional: positive for headache Cardiovascular: positive for lower extremity edema Genitourinary: positive for nocturia    Objective:    BP 170/78 mmHg  Pulse 107  Temp(Src) 98.1 F (36.7 C) (Oral)  Resp 20  Ht 5' 7"$  (1.702 m)  Wt 262 lb 6.4 oz (119.024 kg)  BMI 41.09 kg/m2  SpO2 97%  General:  alert, cooperative and no distress  Oropharynx: normal findings: lips normal without lesions and gums healthy   Eyes:  negative findings: lids and lashes normal and conjunctivae and sclerae normal   Ears:  externeal ears normal        Lung: clear to auscultation bilaterally  Heart:  regular rate and rhythm     Extremities: edema bilateral lower extremities  Skin: warm and dry, no hyperpigmentation, vitiligo, or suspicious lesions     Neuro: mental status, speech normal, alert and oriented x3 and gait and station normal   Lab Review GLUCOSE (mg/dL)  Date Value  03/16/2015 89  10/23/2014 82  08/01/2014 128*   GLUCOSE, BLD (mg/dL)  Date Value  11/03/2014 148*  10/20/2011 180*   CO2 (mmol/L)  Date Value  03/16/2015 25  11/03/2014 28  10/23/2014 25   BUN (mg/dL)  Date Value  03/16/2015 16  11/03/2014 15  10/23/2014 14   08/01/2014 16  10/20/2011 28*   CREATININE, SER (mg/dL)  Date Value  03/16/2015 0.86  11/03/2014 0.93  10/23/2014 0.92       Assessment:    Diabetes Mellitus type II, under good control.   BP above target <140/90    Plan:    1.  Rx changes: 1.  decrease Humalog 20 units breakfast, 14 units lunch, 20 units supper to avoid evening hypoglycemia.  2.  Increase Lisinopril 164mtwice daily.  2.  Continue Lantus 90 units daily. 3.  Continue Victoza 1.36m32maily. 4.  Counseled on nutrition goals.  Needs to stop skipping meals. 5.  Counseled on importance of routine exercise.  Goal is 30-45 minutes 5 x week. 6.  BP too high.  Increase Lisinopril 20m39mice daily.   7.  Continue Triamterene/HCTZ.  2.

## 2015-03-31 ENCOUNTER — Other Ambulatory Visit: Payer: Self-pay | Admitting: *Deleted

## 2015-03-31 DIAGNOSIS — R5382 Chronic fatigue, unspecified: Principal | ICD-10-CM

## 2015-03-31 DIAGNOSIS — G9332 Myalgic encephalomyelitis/chronic fatigue syndrome: Secondary | ICD-10-CM

## 2015-03-31 DIAGNOSIS — M545 Low back pain, unspecified: Secondary | ICD-10-CM

## 2015-03-31 DIAGNOSIS — M069 Rheumatoid arthritis, unspecified: Secondary | ICD-10-CM

## 2015-03-31 DIAGNOSIS — G8929 Other chronic pain: Secondary | ICD-10-CM

## 2015-03-31 DIAGNOSIS — M797 Fibromyalgia: Secondary | ICD-10-CM

## 2015-03-31 MED ORDER — OXYCODONE HCL 5 MG PO TABS: ORAL_TABLET | ORAL | Status: DC

## 2015-03-31 MED ORDER — HYDROCODONE-ACETAMINOPHEN 10-325 MG PO TABS
ORAL_TABLET | ORAL | Status: DC
Start: 2015-03-31 — End: 2015-05-01

## 2015-03-31 NOTE — Telephone Encounter (Signed)
Patient requested and will pick up 

## 2015-04-10 ENCOUNTER — Other Ambulatory Visit: Payer: Self-pay | Admitting: Internal Medicine

## 2015-04-21 DIAGNOSIS — M069 Rheumatoid arthritis, unspecified: Secondary | ICD-10-CM | POA: Diagnosis not present

## 2015-04-21 DIAGNOSIS — M797 Fibromyalgia: Secondary | ICD-10-CM | POA: Diagnosis not present

## 2015-04-21 DIAGNOSIS — M3500 Sicca syndrome, unspecified: Secondary | ICD-10-CM | POA: Diagnosis not present

## 2015-04-21 DIAGNOSIS — Z09 Encounter for follow-up examination after completed treatment for conditions other than malignant neoplasm: Secondary | ICD-10-CM | POA: Diagnosis not present

## 2015-04-30 ENCOUNTER — Other Ambulatory Visit: Payer: Self-pay | Admitting: Internal Medicine

## 2015-05-01 ENCOUNTER — Other Ambulatory Visit: Payer: Self-pay | Admitting: *Deleted

## 2015-05-01 DIAGNOSIS — M545 Low back pain: Secondary | ICD-10-CM

## 2015-05-01 DIAGNOSIS — G8929 Other chronic pain: Secondary | ICD-10-CM

## 2015-05-01 DIAGNOSIS — R5382 Chronic fatigue, unspecified: Principal | ICD-10-CM

## 2015-05-01 DIAGNOSIS — M797 Fibromyalgia: Secondary | ICD-10-CM

## 2015-05-01 DIAGNOSIS — M069 Rheumatoid arthritis, unspecified: Secondary | ICD-10-CM

## 2015-05-01 DIAGNOSIS — G9332 Myalgic encephalomyelitis/chronic fatigue syndrome: Secondary | ICD-10-CM

## 2015-05-01 MED ORDER — OXYCODONE HCL 5 MG PO TABS: ORAL_TABLET | ORAL | Status: DC

## 2015-05-01 MED ORDER — HYDROCODONE-ACETAMINOPHEN 10-325 MG PO TABS: ORAL_TABLET | ORAL | Status: DC

## 2015-05-01 NOTE — Telephone Encounter (Signed)
Patient requested and will pick up 

## 2015-05-22 ENCOUNTER — Encounter: Payer: Self-pay | Admitting: Internal Medicine

## 2015-05-22 ENCOUNTER — Ambulatory Visit (INDEPENDENT_AMBULATORY_CARE_PROVIDER_SITE_OTHER): Payer: Medicare Other | Admitting: Internal Medicine

## 2015-05-22 VITALS — BP 142/68 | HR 92 | Temp 98.6°F | Wt 267.0 lb

## 2015-05-22 DIAGNOSIS — E1169 Type 2 diabetes mellitus with other specified complication: Secondary | ICD-10-CM

## 2015-05-22 DIAGNOSIS — R5382 Chronic fatigue, unspecified: Secondary | ICD-10-CM | POA: Diagnosis not present

## 2015-05-22 DIAGNOSIS — J441 Chronic obstructive pulmonary disease with (acute) exacerbation: Secondary | ICD-10-CM

## 2015-05-22 DIAGNOSIS — E785 Hyperlipidemia, unspecified: Secondary | ICD-10-CM

## 2015-05-22 DIAGNOSIS — M797 Fibromyalgia: Secondary | ICD-10-CM

## 2015-05-22 DIAGNOSIS — E1142 Type 2 diabetes mellitus with diabetic polyneuropathy: Secondary | ICD-10-CM

## 2015-05-22 DIAGNOSIS — J209 Acute bronchitis, unspecified: Secondary | ICD-10-CM

## 2015-05-22 DIAGNOSIS — I10 Essential (primary) hypertension: Secondary | ICD-10-CM

## 2015-05-22 DIAGNOSIS — G9332 Myalgic encephalomyelitis/chronic fatigue syndrome: Secondary | ICD-10-CM

## 2015-05-22 MED ORDER — DOXYCYCLINE HYCLATE 100 MG PO TABS: 100.0000 mg | ORAL_TABLET | Freq: Two times a day (BID) | ORAL | Status: DC

## 2015-05-22 NOTE — Progress Notes (Signed)
Patient ID: Katherine Delgado, female   DOB: 1940-09-23, 74 y.o.   MRN: [id number]   Location:  [facility]  Code Status: DNR Goals of Care: Advanced Directive information Does patient have an advance directive?: Yes, Type of Advance Directive: Out of facility DNR (pink MOST or yellow form), Pre-existing out of facility DNR order (yellow form or pink MOST form): Pink MOST form placed in chart (order not valid for inpatient use)   Chief Complaint  Patient presents with  . Medical Management of Chronic Issues    3 month blood sugar, blood pressure, fibromyalgia, depression, review labs  . cold    congestion in head and chest    HPI: Patient is a 74 y.o. white female seen in the office today for med mgt of chronic diseases and cold symptoms.    Has had increased congestion in her head and chest.  Going on about 2 wks.  Can't get rid of it.  Coughing up yellowish green stuff.  Taking mucinex and alka seltzer plus.  Probably the reason her bp is elevated today.  Sweats at night a few times.    Says she does not get out much to get sick (but does have a lot of doctors she sees now).  Feels like she has more cramping and aching since lisinopril was increased to 10mg  po bid.    Effexor has made a great difference in her depression.  Has missed some b/c drug store claimed she was not yet due, but was filled later (not in the bag with other pills she originally picked up).    Has not been able to afford to see eye doctor--[facility].  Admits it's been 1.5 years.  Cannot recall physician's name.    Wants to wait on flu shot due to current cold symptoms, intermittent chills.  Review of Systems:  Review of Systems  Constitutional: Positive for chills and malaise/fatigue. Negative for fever.  HENT: Positive for congestion. Negative for hearing loss and sore throat.   Eyes: Negative for blurred vision.  Respiratory: Positive for cough, sputum production  and wheezing. Negative for shortness of breath.   Cardiovascular: Negative for chest pain.  Gastrointestinal: Negative for abdominal pain.  Genitourinary: Negative for dysuria.  Musculoskeletal: Positive for myalgias and joint pain. Negative for falls.       Left ankle   Neurological: Negative for dizziness, loss of consciousness, weakness and headaches.  Endo/Heme/Allergies: Bruises/bleeds easily.  Psychiatric/Behavioral: Negative for depression and memory loss.       Spirits much better since effexor added    Past Medical History  Diagnosis Date  . COPD (chronic obstructive pulmonary disease)   . Hypertension   . RA (rheumatoid arthritis)   . Thyroid disease   . Miscarriage 1962  . History of benign thymus tumor   . Back injury   . GERD (gastroesophageal reflux disease)   . Arthritis   . Fibromyalgia   . Diabetes mellitus without complication   . Thyroid disorder   . Acute infective polyneuritis 2002  . Guillain-Barre syndrome   . Polyneuropathy in diabetes(357.2)   . Morbid obesity   . Spondylosis, lumbosacral   . Degenerative arthritis   . Dyslipidemia   . Spinal stenosis, lumbar region, without neurogenic claudication   . Candidiasis of skin and nails   . Acute maxillary sinusitis   . Insomnia, unspecified   . Candidiasis of vulva and vagina   . Edema   .  Acute sinusitis, unspecified   . Obstructive chronic bronchitis with acute bronchitis   . Urinary tract infection, site not specified   . Unspecified hypothyroidism   . Type II or unspecified type diabetes mellitus with peripheral circulatory disorders, uncontrolled(250.72)   . Mixed hyperlipidemia   . Other specified disease of white blood cells   . Depressive disorder, not elsewhere classified   . Chronic pain syndrome   . Tear film insufficiency, unspecified   . Unspecified essential hypertension   . Allergic rhinitis due to pollen   . Unspecified chronic bronchitis   . Reflux esophagitis   .  Diaphragmatic hernia without mention of obstruction or gangrene   . Unspecified pruritic disorder   . Rheumatoid arthritis(714.0)   . Pain in joint, multiple sites   . Stiffness of joints, not elsewhere classified, multiple sites   . Lumbago   . Other malaise and fatigue   . Spontaneous ecchymoses   . Abnormal weight gain   . Shortness of breath   . Cough   . Unspecified urinary incontinence   . Osteopenia, senile   . Osteoporosis     Past Surgical History  Procedure Laterality Date  . Abdominal hysterectomy  1974  . Tonsillectomy    . Cholecystectomy  1984  . Back surgery  1982  . Ovarian cyst surgery  1968  . Thymus tumor    . Knee surgery Bilateral 08/27/2010 (L) and 01/11/2011 (R)  . Peter  . Bronchoscopy  2001  . Abdominal tumor  2002  . Thymus tumor  10/2000  . Appendectomy      Allergies  Allergen Reactions  . Penicillins Hives, Itching, Swelling and Rash  . Sulfamethoxazole-Trimethoprim Itching   Medications: Patient's Medications  New Prescriptions   DOXYCYCLINE (VIBRA-TABS) 100 MG TABLET    Take 1 tablet (100 mg total) by mouth 2 (two) times daily.  Previous Medications   ASPIRIN EC 81 MG TABLET    Take 81 mg by mouth daily as needed.    CHOLECALCIFEROL 2000 UNITS TABS    Take 2,000 Units by mouth daily.   DM-PHENYLEPHRINE-ACETAMINOPHEN 10-5-325 MG CAPS    Take 1 capsule by mouth daily as needed (for cold).   FUROSEMIDE (LASIX) 20 MG TABLET    Take 1 tablet (20 mg total) by mouth 2 (two) times daily as needed. Use as needed for 3 pounds gain in one day or 5 pounds in one week   GABAPENTIN (NEURONTIN) 600 MG TABLET    TAKE 1 TABLET BY MOUTH THREE TIMES DAILY   GLUCOSE BLOOD (ACCU-CHEK SMARTVIEW) TEST STRIP    Use as instructed   HYDROCODONE-ACETAMINOPHEN (NORCO) 10-325 MG PER TABLET    Take one tablet every three hours as needed for pain   INSULIN LISPRO, HUMAN, (HUMALOG KWIKPEN) 200 UNIT/ML SOPN    Inject 20 Units into the skin 3 (three) times  daily before meals. 20 units breakfast, 14 units lunch, 20 units supper   INSULIN PEN NEEDLE 31G X 5 MM MISC    Use with Insulin pen as instructed. Dx E11.59   INSULIN SYRINGE-NEEDLE U-100 31G X 5/16" 1 ML MISC    Use once daily with administration of Insulin DX E11.59   LANTUS 100 UNIT/ML INJECTION    INJECT 90 UNITS INTO THE SKIN DAILY. SPLIT INTO 2 INJECTIONS OF 45 UNITS TO IMPROVE ABSORPTION   LEVOTHYROXINE (SYNTHROID, LEVOTHROID) 175 MCG TABLET    TAKE 1 TABLET BY MOUTH DAILY BEFORE BREAKFAST   LIDOCAINE (LIDODERM)  5 %    Apply patch to bilateral knees at bedtime.  Remove & Discard patch within 12 hours or as directed by MD   LISINOPRIL (PRINIVIL,ZESTRIL) 10 MG TABLET    Take 1 tablet (10 mg total) by mouth 2 (two) times daily.   LOPERAMIDE (IMODIUM A-D) 2 MG TABLET    Take 2 mg by mouth 4 (four) times daily as needed for diarrhea or loose stools.   MELATONIN 10 MG CAPS    Take 1 capsule by mouth at bedtime   NYSTATIN (NYAMYC) 100000 UNIT/GM POWD    Apply 1 g topically daily as needed (for itching).   NYSTATIN-TRIAMCINOLONE (MYCOLOG II) CREAM    Apply 1 application topically 4 (four) times daily as needed (for rash). Apply under abdomen folds twice daily until rash healed   OMEGA-3 ACID ETHYL ESTERS (LOVAZA) 1 G CAPSULE    TAKE 1 CAPSULE BY MOUTH EVERY DAY   OXYCODONE (OXY IR/ROXICODONE) 5 MG IMMEDIATE RELEASE TABLET    Take one tablet every 6 hours as needed for severe pain, Take 2 tablets at bedtime.   PANTOPRAZOLE (PROTONIX) 40 MG TABLET    Take 1 tablet (40 mg total) by mouth 2 (two) times daily.   PILOCARPINE (SALAGEN) 5 MG TABLET    Take 5 mg by mouth daily. For dry mouth   POLYVINYL ALCOHOL (LIQUIFILM TEARS) 1.4 % OPHTHALMIC SOLUTION    Place 1 drop into both eyes as needed (for dry eyes).    POTASSIUM CHLORIDE (MICRO-K) 10 MEQ CR CAPSULE    TAKE ONE CAPSULE BY MOUTH TWICE DAILY   PROMETHAZINE (PHENERGAN) 12.5 MG TABLET    TAKE 1 TABLET BY MOUTH AS NEEDED FOR SEVERE NAUSEA AS DIRECTED    TIOTROPIUM (SPIRIVA HANDIHALER) 18 MCG INHALATION CAPSULE    Place one capsule into the inhaler and inhale daily for breathing   TRIAMTERENE-HYDROCHLOROTHIAZIDE (MAXZIDE-25) 37.5-25 MG PER TABLET    TAKE 1 TABLET BY MOUTH DAILY FOR BLOOD PRESSURE   VENLAFAXINE XR (EFFEXOR-XR) 37.5 MG 24 HR CAPSULE    Take 1 capsule (37.5 mg total) by mouth daily with breakfast.   VICTOZA 18 MG/3ML SOPN    INJECT 1.8MG  UNDER THE SKIN DAILY TO CONTROL BLOOD SUGAR   VITAMIN D, ERGOCALCIFEROL, (DRISDOL) 50000 UNITS CAPS CAPSULE    Take 1 capsule by mouth 2 (two) times a week.   XELJANZ 5 MG TABS    Take 5 mg by mouth daily. For Rheumatoid Arthritis  Modified Medications   No medications on file  Discontinued Medications   No medications on file    Physical Exam: Filed Vitals:   05/22/15 1113  BP: 142/68  Pulse: 92  Temp: 98.6 F (37 C)  TempSrc: Oral  Weight: 267 lb (121.11 kg)  SpO2: 95%   Physical Exam  Constitutional: She is oriented to person, place, and time. She appears well-developed and well-nourished. No distress.  HENT:  Head: Normocephalic and atraumatic.  Right Ear: External ear normal.  Left Ear: External ear normal.  Nose: Nose normal.  Mouth/Throat: Oropharynx is clear and moist.  Sinus congestion  Neck: Neck supple. No JVD present. No tracheal deviation present. No thyromegaly present.  Cardiovascular: Intact distal pulses.   Occasional skipped beat heard  Pulmonary/Chest: Effort normal. She has no wheezes.  Few rhonchi anteriorly  Musculoskeletal: She exhibits tenderness.  Walking with cane; tenderness of left ankle today  Neurological: She is alert and oriented to person, place, and time.  Skin: Skin is warm and dry.  Psychiatric: She has a normal mood and affect.    Labs reviewed: Basic Metabolic Panel:  Recent Labs  10/23/14 0925 11/03/14 1304 02/18/15 03/16/15 0832  NA 139 139 141 142  K 3.8 4.1 4.6 4.2  CL 96* 102  --  98  CO2 25 28  --  25  GLUCOSE 82 148*   --  89  BUN 14 15 21 16   CREATININE 0.92 0.93 0.9 0.86  CALCIUM 9.9 9.4  --  9.5   Liver Function Tests:  Recent Labs  08/01/14 0915 10/23/14 0925 02/18/15  AST 39 43* 55*  ALT 28 31 33  ALKPHOS 140* 122* 128*  BILITOT 0.3 0.5  --   PROT 6.6 6.7  --    No results for input(s): LIPASE, AMYLASE in the last 8760 hours. No results for input(s): AMMONIA in the last 8760 hours. CBC:  Recent Labs  11/03/14 1304 02/18/15  WBC 18.1* 12.0  HGB 13.7 13.9  HCT 42.4 43  MCV 84.5  --   PLT 283 268   Lipid Panel:  Recent Labs  10/23/14 0925 03/16/15 0832  CHOL 131 148  HDL 33* 38*  LDLCALC 72 89  TRIG 128 104  CHOLHDL 4.0 3.9   Lab Results  Component Value Date   HGBA1C 7.2* 03/16/2015    Assessment/Plan 1. Acute bronchitis, unspecified organism - due to her immunocompromised state and risk of pneumonia, will treat with doxycycline and monitor -also advised she may use plain mucinex, but not DM due to bp elevations; also my use coricidin bp -also has diabetes so glucose will likely go up with the cough syrup - doxycycline (VIBRA-TABS) 100 MG tablet; Take 1 tablet (100 mg total) by mouth 2 (two) times daily.  Dispense: 20 tablet; Refill: 0 -would hold xeljanz until resolved  2. COPD exacerbation - will treat due to high risk for pneumonia - doxycycline (VIBRA-TABS) 100 MG tablet; Take 1 tablet (100 mg total) by mouth 2 (two) times daily.  Dispense: 20 tablet; Refill: 0 -cont use of albuterol as needed, spiriva -may need to consider changing her regimen due to frequent exacerbations  3. Type 2 diabetes mellitus with diabetic polyneuropathy -control was improving last check with hba1c 7.2 which is best I've seen for her -cont lantus 90 units daily and humalog 20 units before breakfast, 14 before lunch and 20 before dinner -f/u labs as ordered by Cathey -foot exam was done today -she says she is going to arrange for her eye exam when she can afford the copay--has not  been in about 1.5 years--had been going to [facility]  4. Essential hypertension -bp elevated today even with large cuff -did not change meds today b/c of her use of alka seltzer cold medications and mucinex DM even though she knew she should not use them -cont lisinopril 10mg  po bid, lasix 20mg  daily, triamterene-hctz 37.5/25mg  daily (would need to hold if intake poor), monitor -if remains elevated when she does not have an active infection, would increase the lisinopril to 20mg  po bid -also has bmp coming up to reassess renal function  5. Chronic fatigue fibromyalgia syndrome -seems to be under fairly good control with addition of her antidepressant  6. Hyperlipidemia associated with type 2 diabetes mellitus -wanted to add a statin or zetia to her regimen, however, Dr. Corena Pilgrim is watching her hepatic panel due to her Morrie Sheldon so will hold off on this -encouraged watching fatty foods in her diet, sweets  Labs/tests ordered:No orders of  the defined types were placed in this encounter.   Has labs ordered by Tivis Ringer upcoming and hepatic panel ordered by Dr. Estanislado Pandy  Next appt:  3 mos for med mgt  Mohini Heathcock L. Caio Devera, D.O. [facility] [address], [place] [address] Cell Phone (Mon-Fri 8am-5pm):  [phone] On Call:  [phone] & follow prompts after 5pm & weekends Office Phone:  [phone] Office Fax:  [phone]

## 2015-05-22 NOTE — Patient Instructions (Signed)
Take coricidin BP cough syrup.

## 2015-05-29 ENCOUNTER — Other Ambulatory Visit: Payer: Self-pay | Admitting: *Deleted

## 2015-05-29 DIAGNOSIS — G8929 Other chronic pain: Secondary | ICD-10-CM

## 2015-05-29 DIAGNOSIS — M545 Low back pain, unspecified: Secondary | ICD-10-CM

## 2015-05-29 DIAGNOSIS — R5382 Chronic fatigue, unspecified: Principal | ICD-10-CM

## 2015-05-29 DIAGNOSIS — G9332 Myalgic encephalomyelitis/chronic fatigue syndrome: Secondary | ICD-10-CM

## 2015-05-29 DIAGNOSIS — M797 Fibromyalgia: Secondary | ICD-10-CM

## 2015-05-29 MED ORDER — HYDROCODONE-ACETAMINOPHEN 10-325 MG PO TABS: ORAL_TABLET | ORAL | Status: DC

## 2015-05-29 MED ORDER — OXYCODONE HCL 5 MG PO TABS: ORAL_TABLET | ORAL | Status: DC

## 2015-05-29 NOTE — Telephone Encounter (Signed)
Patient requested and will pick up 

## 2015-06-19 DIAGNOSIS — E559 Vitamin D deficiency, unspecified: Secondary | ICD-10-CM | POA: Diagnosis not present

## 2015-06-19 DIAGNOSIS — Z79899 Other long term (current) drug therapy: Secondary | ICD-10-CM | POA: Diagnosis not present

## 2015-06-20 DIAGNOSIS — Z1231 Encounter for screening mammogram for malignant neoplasm of breast: Secondary | ICD-10-CM | POA: Diagnosis not present

## 2015-06-20 LAB — HM MAMMOGRAPHY

## 2015-06-24 ENCOUNTER — Encounter: Payer: Self-pay | Admitting: *Deleted

## 2015-06-25 ENCOUNTER — Other Ambulatory Visit: Payer: Medicare Other

## 2015-06-25 DIAGNOSIS — E1142 Type 2 diabetes mellitus with diabetic polyneuropathy: Secondary | ICD-10-CM

## 2015-06-26 LAB — COMPREHENSIVE METABOLIC PANEL
ALT: 45 IU/L — ABNORMAL HIGH (ref 0–32)
AST: 75 IU/L — ABNORMAL HIGH (ref 0–40)
Albumin/Globulin Ratio: 1.4 (ref 1.1–2.5)
Albumin: 3.9 g/dL (ref 3.5–4.8)
Alkaline Phosphatase: 132 IU/L — ABNORMAL HIGH (ref 39–117)
BUN/Creatinine Ratio: 24 (ref 11–26)
BUN: 22 mg/dL (ref 8–27)
Bilirubin Total: 0.4 mg/dL (ref 0.0–1.2)
CO2: 23 mmol/L (ref 18–29)
Calcium: 9.3 mg/dL (ref 8.7–10.3)
Chloride: 95 mmol/L — ABNORMAL LOW (ref 97–106)
Creatinine, Ser: 0.92 mg/dL (ref 0.57–1.00)
GFR calc Af Amer: 71 mL/min/{1.73_m2} (ref >59)
GFR calc non Af Amer: 62 mL/min/{1.73_m2} (ref >59)
Globulin, Total: 2.8 g/dL (ref 1.5–4.5)
Glucose: 211 mg/dL — ABNORMAL HIGH (ref 65–99)
Potassium: 4.4 mmol/L (ref 3.5–5.2)
Sodium: 138 mmol/L (ref 136–144)
Total Protein: 6.7 g/dL (ref 6.0–8.5)

## 2015-06-26 LAB — HEMOGLOBIN A1C
Est. average glucose Bld gHb Est-mCnc: 194 mg/dL
Hgb A1c MFr Bld: 8.4 % — ABNORMAL HIGH (ref 4.8–5.6)

## 2015-06-29 ENCOUNTER — Other Ambulatory Visit: Payer: Self-pay | Admitting: *Deleted

## 2015-06-29 ENCOUNTER — Ambulatory Visit (INDEPENDENT_AMBULATORY_CARE_PROVIDER_SITE_OTHER): Payer: Medicare Other | Admitting: Pharmacotherapy

## 2015-06-29 ENCOUNTER — Encounter: Payer: Self-pay | Admitting: Pharmacotherapy

## 2015-06-29 VITALS — BP 170/82 | HR 116 | Temp 98.7°F | Resp 22 | Ht 67.0 in | Wt 270.4 lb

## 2015-06-29 DIAGNOSIS — M545 Low back pain: Secondary | ICD-10-CM

## 2015-06-29 DIAGNOSIS — I10 Essential (primary) hypertension: Secondary | ICD-10-CM

## 2015-06-29 DIAGNOSIS — M797 Fibromyalgia: Secondary | ICD-10-CM

## 2015-06-29 DIAGNOSIS — E1142 Type 2 diabetes mellitus with diabetic polyneuropathy: Secondary | ICD-10-CM | POA: Diagnosis not present

## 2015-06-29 DIAGNOSIS — G9332 Myalgic encephalomyelitis/chronic fatigue syndrome: Secondary | ICD-10-CM

## 2015-06-29 DIAGNOSIS — G8929 Other chronic pain: Secondary | ICD-10-CM

## 2015-06-29 DIAGNOSIS — R5382 Chronic fatigue, unspecified: Principal | ICD-10-CM

## 2015-06-29 MED ORDER — HYDROCODONE-ACETAMINOPHEN 10-325 MG PO TABS: ORAL_TABLET | ORAL | Status: DC

## 2015-06-29 MED ORDER — INSULIN LISPRO 200 UNIT/ML ~~LOC~~ SOPN: 20.0000 [IU] | PEN_INJECTOR | Freq: Three times a day (TID) | SUBCUTANEOUS | Status: DC

## 2015-06-29 MED ORDER — AMLODIPINE BESYLATE 5 MG PO TABS: 5.0000 mg | ORAL_TABLET | Freq: Every day | ORAL | Status: DC

## 2015-06-29 MED ORDER — OXYCODONE HCL 5 MG PO TABS: ORAL_TABLET | ORAL | Status: DC

## 2015-06-29 MED ORDER — INSULIN GLARGINE 300 UNIT/ML ~~LOC~~ SOPN: 100.0000 [IU] | PEN_INJECTOR | Freq: Every day | SUBCUTANEOUS | Status: DC

## 2015-06-29 NOTE — Patient Instructions (Addendum)
1.  Use Flonase / Nasonex / Nasocort - twice daily until symptoms resolve. 2.  Increase Lantus to 100 units daily.  Change to Toujeo when current Lantus supply ends. 3.  Change to Humalog U200 when current supply of Humalog U100 ends.  20 units with each meal. 4.  Start amlodipine 5mg  daily.

## 2015-06-29 NOTE — Telephone Encounter (Signed)
Patient requested and will pick up. Narcotic contract printed. 

## 2015-06-29 NOTE — Progress Notes (Signed)
  Subjective:    Katherine Delgado is a 74 y.o.white female who presents for follow-up of Type 2 diabetes mellitus.   A1C is up to 8.4% (was 7.2%) She is recovering from bronchitis.  Took antibiotics x 10 days.  She did not bring blood glucose meter. She says BG are in the 200s. No hypoglycemia  She denies missing doses of insulin or Victoza.  Has had some loose stools - thinks she ate a bad salad. She has been coughing. She stopped taking her melatonin and venlafaxine because she felt they were increasing her BP values. Her home BP also high.  She is not making healthy food choices. No routine exercise. Some blurry vision with up close reading. Some peripheral edema Some peripheral neuropathy. Complains of back pain. Not sleeping well. Nocturia 2-3 times per night.   Review of Systems A comprehensive review of systems was negative except for: Constitutional: positive for fatigue Eyes: positive for blurry vision Ears, nose, mouth, throat, and face: positive for hoarseness and nasal congestion Respiratory: positive for cough Cardiovascular: positive for lower extremity edema Gastrointestinal: positive for change in bowel habits Genitourinary: positive for nocturia Musculoskeletal: positive for back pain    Objective:    BP 170/82 mmHg  Pulse 116  Temp(Src) 98.7 F (37.1 C) (Oral)  Resp 22  Ht 5\' 7"  (1.702 m)  Wt 270 lb 6.4 oz (122.653 kg)  BMI 42.34 kg/m2  SpO2 94%  General:  alert, cooperative and fatigued  Oropharynx: normal findings: lips normal without lesions and gums healthy and abnormal findings: nasal congestion   Eyes:  negative findings: lids and lashes normal and conjunctivae and sclerae normal   Ears:  external ears normal        Lung: clear to auscultation bilaterally  Heart:  regular rate and rhythm     Extremities: edema bilateral lower extremitites  Skin: warm and dry, no hyperpigmentation, vitiligo, or suspicious lesions     Neuro: mental  status, speech normal, alert and oriented x3 and gait and station normal   Lab Review GLUCOSE (mg/dL)  Date Value  06/25/2015 211*  03/16/2015 89  10/23/2014 82   GLUCOSE, BLD (mg/dL)  Date Value  11/03/2014 148*  10/20/2011 180*   CO2 (mmol/L)  Date Value  06/25/2015 23  03/16/2015 25  11/03/2014 28   BUN (mg/dL)  Date Value  06/25/2015 22  03/16/2015 16  02/18/2015 21  11/03/2014 15  10/20/2011 28*   CREATININE (mg/dL)  Date Value  02/18/2015 0.9   CREATININE, SER (mg/dL)  Date Value  06/25/2015 0.92  03/16/2015 0.86  11/03/2014 0.93       Assessment:    Diabetes Mellitus type II, under poor control. A1C above goal <7% BP above goal <140/90 Nasal congestion causing cough.   Plan:    1.  Rx changes: 1.  Change Lantus to Toujeo for improved absorption.  Increase glargine dose to 100 units daily.  2.   Change Humalog to U200 for improved absorption.  20 units with each meal.  3.  Add amlodipine 5mg  daily for elevated BP.  Good compliment to lisinopril. 2.  Continue Victoza 1.8mg  daily. 3.  Use OTC Nasonex / Flonase / Nasocort twice daily until congestion clears. 4.  Exercise goal is 30-45 minutes 5 x week. 5.  Will need to return in 3-4 weeks for a BP check. 6.  She is to call if symptoms worsen, drainage becomes purulent.

## 2015-07-04 ENCOUNTER — Other Ambulatory Visit: Payer: Self-pay | Admitting: Internal Medicine

## 2015-07-13 ENCOUNTER — Other Ambulatory Visit: Payer: Self-pay | Admitting: Internal Medicine

## 2015-07-24 ENCOUNTER — Other Ambulatory Visit: Payer: Self-pay | Admitting: Internal Medicine

## 2015-07-24 ENCOUNTER — Other Ambulatory Visit: Payer: Self-pay | Admitting: Pharmacotherapy

## 2015-07-28 ENCOUNTER — Encounter: Payer: Self-pay | Admitting: Nurse Practitioner

## 2015-07-28 ENCOUNTER — Ambulatory Visit (INDEPENDENT_AMBULATORY_CARE_PROVIDER_SITE_OTHER): Payer: Medicare Other | Admitting: Nurse Practitioner

## 2015-07-28 ENCOUNTER — Other Ambulatory Visit: Payer: Self-pay | Admitting: Nurse Practitioner

## 2015-07-28 VITALS — BP 150/80 | HR 108 | Temp 97.9°F | Resp 22 | Ht 67.0 in | Wt 271.4 lb

## 2015-07-28 DIAGNOSIS — I10 Essential (primary) hypertension: Secondary | ICD-10-CM | POA: Diagnosis not present

## 2015-07-28 DIAGNOSIS — R000 Tachycardia, unspecified: Secondary | ICD-10-CM | POA: Diagnosis not present

## 2015-07-28 DIAGNOSIS — E559 Vitamin D deficiency, unspecified: Secondary | ICD-10-CM | POA: Diagnosis not present

## 2015-07-28 DIAGNOSIS — Z79899 Other long term (current) drug therapy: Secondary | ICD-10-CM | POA: Diagnosis not present

## 2015-07-28 MED ORDER — METOPROLOL TARTRATE 25 MG PO TABS: 25.0000 mg | ORAL_TABLET | Freq: Two times a day (BID) | ORAL | Status: DC

## 2015-07-28 NOTE — Progress Notes (Signed)
Patient ID: Katherine Delgado, female   DOB: 1941/06/08, 74 y.o.   MRN: [id number]    PCP: Hollace Kinnier, DO  Advanced Directive information Does patient have an advance directive?: Yes  Allergies  Allergen Reactions  . Penicillins Hives, Itching, Swelling and Rash  . Sulfamethoxazole-Trimethoprim Itching    Chief Complaint  Patient presents with  . Medical Management of Chronic Issues    4 week B/P check ( Hypertension, DM, COPD )  . OTHER    Patient has record od B/P in chart     HPI: Patient is a 74 y.o. female seen in the office today to follow up blood pressure and pulse. Feeling more tired and sluggish lately.  More worn out. Pulse and blood pressure goes up easily.  Blood pressures ranging from 120-178/63-80, pulse 78-102 No headaches or blurred vision.  Feel palpitations and skipped beats at time. No chest pains. Does get short of breath easily.   Review of Systems:  Review of Systems  Constitutional: Positive for fatigue. Negative for fever, activity change, appetite change and unexpected weight change.  HENT: Negative for congestion and hearing loss.   Eyes: Negative.  Negative for visual disturbance.  Respiratory: Positive for shortness of breath (easily with activity). Negative for cough.   Cardiovascular: Positive for palpitations. Negative for chest pain and leg swelling.  Gastrointestinal: Negative for abdominal pain, diarrhea and constipation.  Skin: Negative for color change and wound.  Neurological: Negative for dizziness, weakness, light-headedness and headaches.    Past Medical History  Diagnosis Date  . COPD (chronic obstructive pulmonary disease) ([place])   . Hypertension   . RA (rheumatoid arthritis) ([place])   . Thyroid disease   . Miscarriage 1962  . History of benign thymus tumor   . Back injury   . GERD (gastroesophageal reflux disease)   . Arthritis   . Fibromyalgia   . Diabetes mellitus without complication ([place])   . Thyroid disorder   . Acute  infective polyneuritis ([place]) 2002  . Guillain-Barre syndrome ([place])   . Polyneuropathy in diabetes(357.2)   . Morbid obesity ([place])   . Spondylosis, lumbosacral   . Degenerative arthritis   . Dyslipidemia   . Spinal stenosis, lumbar region, without neurogenic claudication   . Candidiasis of skin and nails   . Acute maxillary sinusitis   . Insomnia, unspecified   . Candidiasis of vulva and vagina   . Edema   . Acute sinusitis, unspecified   . Obstructive chronic bronchitis with acute bronchitis ([place])   . Urinary tract infection, site not specified   . Unspecified hypothyroidism   . Type II or unspecified type diabetes mellitus with peripheral circulatory disorders, uncontrolled(250.72)   . Mixed hyperlipidemia   . Other specified disease of white blood cells   . Depressive disorder, not elsewhere classified   . Chronic pain syndrome   . Tear film insufficiency, unspecified   . Unspecified essential hypertension   . Allergic rhinitis due to pollen   . Unspecified chronic bronchitis ([place])   . Reflux esophagitis   . Diaphragmatic hernia without mention of obstruction or gangrene   . Unspecified pruritic disorder   . Rheumatoid arthritis(714.0)   . Pain in joint, multiple sites   . Stiffness of joints, not elsewhere classified, multiple sites   . Lumbago   . Other malaise and fatigue   . Spontaneous ecchymoses   . Abnormal weight gain   . Shortness of breath   . Cough   . Unspecified urinary  incontinence   . Osteopenia, senile   . Osteoporosis    Past Surgical History  Procedure Laterality Date  . Abdominal hysterectomy  1974  . Tonsillectomy    . Cholecystectomy  1984  . Back surgery  1982  . Ovarian cyst surgery  1968  . Thymus tumor    . Knee surgery Bilateral 08/27/2010 (L) and 01/11/2011 (R)  . [place]  . Bronchoscopy  2001  . Abdominal tumor  2002  . Thymus tumor  10/2000  . Appendectomy     Social History:   reports that she quit smoking about 35  years ago. Her smoking use included Cigarettes. She has never used smokeless tobacco. She reports that she does not drink alcohol or use illicit drugs.  Family History  Problem Relation Age of Onset  . Alzheimer's disease Mother   . Heart disease Mother   . Heart disease Father   . Liver disease Father   . Breast cancer Sister   . Colon cancer Neg Hx   . Esophageal cancer Neg Hx   . Kidney disease Neg Hx   . Stomach cancer Neg Hx   . Rectal cancer Neg Hx   . Colon polyps Brother     Medications: Patient's Medications  New Prescriptions   METOPROLOL TARTRATE (LOPRESSOR) 25 MG TABLET    Take 1 tablet (25 mg total) by mouth 2 (two) times daily.  Previous Medications   AMLODIPINE (NORVASC) 5 MG TABLET    Take 1 tablet (5 mg total) by mouth daily.   ASPIRIN EC 81 MG TABLET    Take 81 mg by mouth daily as needed.    CHOLECALCIFEROL 2000 UNITS TABS    Take 2,000 Units by mouth daily.   DM-PHENYLEPHRINE-ACETAMINOPHEN 10-5-325 MG CAPS    Take 1 capsule by mouth daily as needed (for cold).   FUROSEMIDE (LASIX) 20 MG TABLET    Take 1 tablet (20 mg total) by mouth 2 (two) times daily as needed. Use as needed for 3 pounds gain in one day or 5 pounds in one week   GABAPENTIN (NEURONTIN) 600 MG TABLET    TAKE 1 TABLET BY MOUTH THREE TIMES DAILY   GLUCOSE BLOOD (ACCU-CHEK SMARTVIEW) TEST STRIP    Use as instructed   HYDROCODONE-ACETAMINOPHEN (NORCO) 10-325 MG TABLET    Take one tablet every three hours as needed for pain   INSULIN GLARGINE (TOUJEO SOLOSTAR) 300 UNIT/ML SOPN    Inject 100 Units into the skin daily.   INSULIN LISPRO (HUMALOG KWIKPEN) 200 UNIT/ML SOPN    Inject 20 Units into the skin 3 (three) times daily before meals.   INSULIN PEN NEEDLE 31G X 5 MM MISC    Use with Insulin pen as instructed. Dx E11.59   INSULIN SYRINGE-NEEDLE U-100 31G X 5/16" 1 ML MISC    Use once daily with administration of Insulin DX E11.59   LEVOTHYROXINE (SYNTHROID, LEVOTHROID) 175 MCG TABLET    TAKE 1 TABLET  BY MOUTH DAILY BEFORE BREAKFAST   LIDOCAINE (LIDODERM) 5 %    Apply patch to bilateral knees at bedtime.  Remove & Discard patch within 12 hours or as directed by MD   LISINOPRIL (PRINIVIL,ZESTRIL) 10 MG TABLET    Take 1 tablet (10 mg total) by mouth 2 (two) times daily.   LOPERAMIDE (IMODIUM A-D) 2 MG TABLET    Take 2 mg by mouth 4 (four) times daily as needed for diarrhea or loose stools.   MELATONIN 10 MG  CAPS    Take 1 capsule by mouth at bedtime   NYSTATIN (NYAMYC) 100000 UNIT/GM POWD    Apply 1 g topically daily as needed (for itching).   NYSTATIN-TRIAMCINOLONE (MYCOLOG II) CREAM    Apply 1 application topically 4 (four) times daily as needed (for rash). Apply under abdomen folds twice daily until rash healed   OMEGA-3 ACID ETHYL ESTERS (LOVAZA) 1 G CAPSULE    TAKE ONE CAPSULE BY MOUTH EVERY DAY   OXYCODONE (OXY IR/ROXICODONE) 5 MG IMMEDIATE RELEASE TABLET    Take one tablet every 6 hours as needed for severe pain, Take 2 tablets at bedtime.   PANTOPRAZOLE (PROTONIX) 40 MG TABLET    Take 1 tablet (40 mg total) by mouth 2 (two) times daily.   PILOCARPINE (SALAGEN) 5 MG TABLET    Take 5 mg by mouth daily. For dry mouth   POLYVINYL ALCOHOL (LIQUIFILM TEARS) 1.4 % OPHTHALMIC SOLUTION    Place 1 drop into both eyes as needed (for dry eyes).    POTASSIUM CHLORIDE (MICRO-K) 10 MEQ CR CAPSULE    TAKE 1 CAPSULE BY MOUTH TWICE DAILY   PROMETHAZINE (PHENERGAN) 12.5 MG TABLET    TAKE 1 TABLET BY MOUTH AS NEEDED FOR SEVERE NAUSEA AS DIRECTED   TIOTROPIUM (SPIRIVA HANDIHALER) 18 MCG INHALATION CAPSULE    Place one capsule into the inhaler and inhale daily for breathing   TRIAMTERENE-HYDROCHLOROTHIAZIDE (MAXZIDE-25) 37.5-25 MG PER TABLET    TAKE 1 TABLET BY MOUTH DAILY FOR BLOOD PRESSURE   VENLAFAXINE XR (EFFEXOR-XR) 37.5 MG 24 HR CAPSULE    TAKE 1 CAPSULE(37.5 MG) BY MOUTH DAILY WITH BREAKFAST   VICTOZA 18 MG/3ML SOPN    ADMINISTER 1.8 MG UNDER THE SKIN DAILY FOR BLOOD SUGAR   VITAMIN D, ERGOCALCIFEROL,  (DRISDOL) 50000 UNITS CAPS CAPSULE    Take 1 capsule by mouth 2 (two) times a week.   XELJANZ 5 MG TABS    Take 5 mg by mouth daily. For Rheumatoid Arthritis  Modified Medications   No medications on file  Discontinued Medications   DOXYCYCLINE (VIBRA-TABS) 100 MG TABLET    Take 1 tablet (100 mg total) by mouth 2 (two) times daily.     Physical Exam:  Filed Vitals:   07/28/15 1002  BP: 150/80  Pulse: 108  Temp: 97.9 F (36.6 C)  TempSrc: Oral  Resp: 22  Height: 5\' 7"  (1.702 m)  Weight: 271 lb 6.4 oz (123.106 kg)  SpO2: 95%   Body mass index is 42.5 kg/(m^2).  Physical Exam  Constitutional: She is oriented to person, place, and time. She appears well-developed and well-nourished. No distress.  HENT:  Head: Normocephalic and atraumatic.  Right Ear: External ear normal.  Left Ear: External ear normal.  Mouth/Throat: Oropharynx is clear and moist.  Eyes: Conjunctivae and EOM are normal. Pupils are equal, round, and reactive to light.  Neck: Normal range of motion. Neck supple. No JVD present. No tracheal deviation present. No thyromegaly present.  Cardiovascular: Regular rhythm.  Tachycardia present.   Occasional skipped beat heard  Pulmonary/Chest: Effort normal and breath sounds normal. She has no wheezes.  Neurological: She is alert and oriented to person, place, and time.  Skin: Skin is warm and dry.  Psychiatric: She has a normal mood and affect.    Labs reviewed: Basic Metabolic Panel:  Recent Labs  11/03/14 1304 02/18/15 03/16/15 0832 06/25/15 0839  NA 139 141 142 138  K 4.1 4.6 4.2 4.4  CL 102  --  98 95*  CO2 28  --  25 23  GLUCOSE 148*  --  89 211*  BUN 15 21 16 22   CREATININE 0.93 0.9 0.86 0.92  CALCIUM 9.4  --  9.5 9.3   Liver Function Tests:  Recent Labs  08/01/14 0915 10/23/14 0925 02/18/15 06/25/15 0839  AST 39 43* 55* 75*  ALT 28 31 33 45*  ALKPHOS 140* 122* 128* 132*  BILITOT 0.3 0.5  --  0.4  PROT 6.6 6.7  --  6.7  ALBUMIN 3.9 4.1   --  3.9   No results for input(s): LIPASE, AMYLASE in the last 8760 hours. No results for input(s): AMMONIA in the last 8760 hours. CBC:  Recent Labs  11/03/14 1304 02/18/15  WBC 18.1* 12.0  HGB 13.7 13.9  HCT 42.4 43  MCV 84.5  --   PLT 283 268   Lipid Panel:  Recent Labs  10/23/14 0925 03/16/15 0832  CHOL 131 148  HDL 33* 38*  LDLCALC 72 89  TRIG 128 104  CHOLHDL 4.0 3.9   TSH: No results for input(s): TSH in the last 8760 hours. A1C: Lab Results  Component Value Date   HGBA1C 8.4* 06/25/2015     Assessment/Plan 1. Tachycardia -mildly elevated heart rate today and occasionally elevated at home.  -due to increase fatigue will get blood work to rule to worsening hypothyroid, electrolyte imbalance or anemia -will start lopressor 25 mg twice daily at this time to help improve HR and BP - Basic metabolic panel - CBC with Differential - TSH - metoprolol tartrate (LOPRESSOR) 25 MG tablet; Take 1 tablet (25 mg total) by mouth 2 (two) times daily.  Dispense: 60 tablet; Refill: 1  2. Essential hypertension -has improved but still elevated, home sbp reading ranging from  - metoprolol tartrate (LOPRESSOR) 25 MG tablet; Take 1 tablet (25 mg total) by mouth 2 (two) times daily.  Dispense: 60 tablet; Refill: 1  To cont to take blood pressure readings and HR and bring to next visit To follow up in 2 weeks    Caedence Snowden K. Harle Battiest  [facility] [phone] 8 am - 5 pm) [phone] (after hours)

## 2015-07-28 NOTE — Patient Instructions (Signed)
Will start metoprolol 25 mg twice daily for blood pressure and heart rate  Will get lab work today

## 2015-07-29 ENCOUNTER — Other Ambulatory Visit: Payer: Self-pay

## 2015-07-29 DIAGNOSIS — R5382 Chronic fatigue, unspecified: Secondary | ICD-10-CM

## 2015-07-29 DIAGNOSIS — M797 Fibromyalgia: Secondary | ICD-10-CM

## 2015-07-29 DIAGNOSIS — G9332 Myalgic encephalomyelitis/chronic fatigue syndrome: Secondary | ICD-10-CM

## 2015-07-29 DIAGNOSIS — M545 Low back pain: Principal | ICD-10-CM

## 2015-07-29 DIAGNOSIS — G8929 Other chronic pain: Secondary | ICD-10-CM

## 2015-07-29 LAB — CBC WITH DIFFERENTIAL/PLATELET
Basophils Absolute: 0.1 10*3/uL (ref 0.0–0.2)
Basos: 0 %
EOS (ABSOLUTE): 0.4 10*3/uL (ref 0.0–0.4)
Eos: 3 %
Hematocrit: 42.8 % (ref 34.0–46.6)
Hemoglobin: 13.7 g/dL (ref 11.1–15.9)
IMMATURE GRANULOCYTES: 1 %
Immature Grans (Abs): 0.1 10*3/uL (ref 0.0–0.1)
Lymphocytes Absolute: 2.7 10*3/uL (ref 0.7–3.1)
Lymphs: 18 %
MCH: 26.6 pg (ref 26.6–33.0)
MCHC: 32 g/dL (ref 31.5–35.7)
MCV: 83 fL (ref 79–97)
MONOS ABS: 1.4 10*3/uL — AB (ref 0.1–0.9)
Monocytes: 9 %
NEUTROS PCT: 69 %
Neutrophils Absolute: 10.5 10*3/uL — ABNORMAL HIGH (ref 1.4–7.0)
PLATELETS: 338 10*3/uL (ref 150–379)
RBC: 5.16 x10E6/uL (ref 3.77–5.28)
RDW: 14.7 % (ref 12.3–15.4)
WBC: 15.2 10*3/uL — AB (ref 3.4–10.8)

## 2015-07-29 LAB — BASIC METABOLIC PANEL
BUN/Creatinine Ratio: 20 (ref 11–26)
BUN: 20 mg/dL (ref 8–27)
CALCIUM: 9.6 mg/dL (ref 8.7–10.3)
CO2: 24 mmol/L (ref 18–29)
Chloride: 98 mmol/L (ref 97–106)
Creatinine, Ser: 0.99 mg/dL (ref 0.57–1.00)
GFR calc Af Amer: 65 mL/min/{1.73_m2} (ref >59)
GFR, EST NON AFRICAN AMERICAN: 56 mL/min/{1.73_m2} — AB (ref >59)
GLUCOSE: 116 mg/dL — AB (ref 65–99)
POTASSIUM: 4.2 mmol/L (ref 3.5–5.2)
SODIUM: 140 mmol/L (ref 136–144)

## 2015-07-29 LAB — TSH: TSH: 0.344 u[IU]/mL — ABNORMAL LOW (ref 0.450–4.500)

## 2015-07-29 MED ORDER — OXYCODONE HCL 5 MG PO TABS: ORAL_TABLET | ORAL | Status: DC

## 2015-07-29 MED ORDER — HYDROCODONE-ACETAMINOPHEN 10-325 MG PO TABS: ORAL_TABLET | ORAL | Status: DC

## 2015-07-31 ENCOUNTER — Telehealth: Payer: Self-pay

## 2015-07-31 DIAGNOSIS — E079 Disorder of thyroid, unspecified: Secondary | ICD-10-CM

## 2015-07-31 MED ORDER — LEVOTHYROXINE SODIUM 150 MCG PO TABS
ORAL_TABLET | ORAL | Status: DC
Start: 2015-07-31 — End: 2016-03-11

## 2015-07-31 NOTE — Telephone Encounter (Signed)
Called patient about lab results, will fax new Rx to [facility], return for [facility] 09/11/15

## 2015-08-03 ENCOUNTER — Other Ambulatory Visit: Payer: Self-pay | Admitting: Internal Medicine

## 2015-08-11 ENCOUNTER — Encounter: Payer: Self-pay | Admitting: Nurse Practitioner

## 2015-08-11 ENCOUNTER — Ambulatory Visit (INDEPENDENT_AMBULATORY_CARE_PROVIDER_SITE_OTHER): Payer: Medicare Other | Admitting: Nurse Practitioner

## 2015-08-11 ENCOUNTER — Other Ambulatory Visit: Payer: Self-pay | Admitting: Nurse Practitioner

## 2015-08-11 VITALS — BP 158/82 | HR 74 | Resp 12 | Ht 67.0 in | Wt 271.0 lb

## 2015-08-11 DIAGNOSIS — I10 Essential (primary) hypertension: Secondary | ICD-10-CM

## 2015-08-11 DIAGNOSIS — E079 Disorder of thyroid, unspecified: Secondary | ICD-10-CM | POA: Diagnosis not present

## 2015-08-11 DIAGNOSIS — R000 Tachycardia, unspecified: Secondary | ICD-10-CM | POA: Diagnosis not present

## 2015-08-11 MED ORDER — AMLODIPINE BESYLATE 10 MG PO TABS: 10.0000 mg | ORAL_TABLET | Freq: Every day | ORAL | Status: DC

## 2015-08-11 NOTE — Patient Instructions (Addendum)
Change labs so all lab work will be obtained on the same day on february 2nd   Increase Norvasc to 10 mg daily   Keep follow up with Dr Mariea Clonts  Cont to record pulse and BP and bring to visit

## 2015-08-11 NOTE — Progress Notes (Signed)
Patient ID: Katherine Delgado, female   DOB: 02/17/1941, 74 y.o.   MRN: [id number]    PCP: Hollace Kinnier, DO  Advanced Directive information    Allergies  Allergen Reactions  . Penicillins Hives, Itching, Swelling and Rash  . Sulfamethoxazole-Trimethoprim Itching    Chief Complaint  Patient presents with  . Follow-up    2 week follow-up on BP and HR, patient still with elevated B/P readings at home     HPI: Patient is a 74 y.o. female seen in the office today to follow up blood pressure and pulse. Blood pressure elevated at last visit and lopressor 25 mg twice daily added due to tachycardia and hypertension.  Found to have low TSH, synthroid was reduced to 150 mcg.  Taking norvasc, lisinopril, metoprolol and triamterene-hctz.  Blood pressure reading remain elevated 148-202/69-88.  HR ranging from 70-101 mostly 70-80s.  Minimal swelling in lower extremities.  Review of Systems:  Review of Systems  Constitutional: Positive for fatigue. Negative for fever, activity change, appetite change and unexpected weight change.  HENT: Negative for congestion and hearing loss.   Eyes: Negative.  Negative for visual disturbance.  Respiratory: Positive for shortness of breath (easily with activity). Negative for cough.   Cardiovascular: Positive for palpitations. Negative for chest pain and leg swelling.  Gastrointestinal: Negative for abdominal pain, diarrhea and constipation.  Skin: Negative for color change and wound.  Neurological: Negative for dizziness, weakness, light-headedness and headaches.    Past Medical History  Diagnosis Date  . COPD (chronic obstructive pulmonary disease) ([place])   . Hypertension   . RA (rheumatoid arthritis) ([place])   . Thyroid disease   . Miscarriage 1962  . History of benign thymus tumor   . Back injury   . GERD (gastroesophageal reflux disease)   . Arthritis   . Fibromyalgia   . Diabetes mellitus without complication ([place])   . Thyroid disorder   . Acute  infective polyneuritis ([place]) 2002  . Guillain-Barre syndrome ([place])   . Polyneuropathy in diabetes(357.2)   . Morbid obesity ([place])   . Spondylosis, lumbosacral   . Degenerative arthritis   . Dyslipidemia   . Spinal stenosis, lumbar region, without neurogenic claudication   . Candidiasis of skin and nails   . Acute maxillary sinusitis   . Insomnia, unspecified   . Candidiasis of vulva and vagina   . Edema   . Acute sinusitis, unspecified   . Obstructive chronic bronchitis with acute bronchitis ([place])   . Urinary tract infection, site not specified   . Unspecified hypothyroidism   . Type II or unspecified type diabetes mellitus with peripheral circulatory disorders, uncontrolled(250.72)   . Mixed hyperlipidemia   . Other specified disease of white blood cells   . Depressive disorder, not elsewhere classified   . Chronic pain syndrome   . Tear film insufficiency, unspecified   . Unspecified essential hypertension   . Allergic rhinitis due to pollen   . Unspecified chronic bronchitis ([place])   . Reflux esophagitis   . Diaphragmatic hernia without mention of obstruction or gangrene   . Unspecified pruritic disorder   . Rheumatoid arthritis(714.0)   . Pain in joint, multiple sites   . Stiffness of joints, not elsewhere classified, multiple sites   . Lumbago   . Other malaise and fatigue   . Spontaneous ecchymoses   . Abnormal weight gain   . Shortness of breath   . Cough   . Unspecified urinary incontinence   . Osteopenia, senile   .  Osteoporosis    Past Surgical History  Procedure Laterality Date  . Abdominal hysterectomy  1974  . Tonsillectomy    . Cholecystectomy  1984  . Back surgery  1982  . Ovarian cyst surgery  1968  . Thymus tumor    . Knee surgery Bilateral 08/27/2010 (L) and 01/11/2011 (R)  . [place]  . Bronchoscopy  2001  . Abdominal tumor  2002  . Thymus tumor  10/2000  . Appendectomy     Social History:   reports that she quit smoking about 35  years ago. Her smoking use included Cigarettes. She has never used smokeless tobacco. She reports that she does not drink alcohol or use illicit drugs.  Family History  Problem Relation Age of Onset  . Alzheimer's disease Mother   . Heart disease Mother   . Heart disease Father   . Liver disease Father   . Breast cancer Sister   . Colon cancer Neg Hx   . Esophageal cancer Neg Hx   . Kidney disease Neg Hx   . Stomach cancer Neg Hx   . Rectal cancer Neg Hx   . Colon polyps Brother     Medications: Patient's Medications  New Prescriptions   No medications on file  Previous Medications   AMLODIPINE (NORVASC) 5 MG TABLET    Take 1 tablet (5 mg total) by mouth daily.   ASPIRIN EC 81 MG TABLET    Take 81 mg by mouth daily as needed.    CHOLECALCIFEROL 2000 UNITS TABS    Take 2,000 Units by mouth daily.   DM-PHENYLEPHRINE-ACETAMINOPHEN 10-5-325 MG CAPS    Take 1 capsule by mouth daily as needed (for cold).   FUROSEMIDE (LASIX) 20 MG TABLET    Take 1 tablet (20 mg total) by mouth 2 (two) times daily as needed. Use as needed for 3 pounds gain in one day or 5 pounds in one week   GABAPENTIN (NEURONTIN) 600 MG TABLET    TAKE 1 TABLET BY MOUTH THREE TIMES DAILY   GLUCOSE BLOOD (ACCU-CHEK SMARTVIEW) TEST STRIP    Use as instructed   HYDROCODONE-ACETAMINOPHEN (NORCO) 10-325 MG TABLET    Take one tablet every three hours as needed for pain   INSULIN GLARGINE (TOUJEO SOLOSTAR) 300 UNIT/ML SOPN    Inject 100 Units into the skin daily.   INSULIN LISPRO (HUMALOG KWIKPEN) 200 UNIT/ML SOPN    Inject 20 Units into the skin 3 (three) times daily before meals.   INSULIN SYRINGE-NEEDLE U-100 31G X 5/16" 1 ML MISC    Use once daily with administration of Insulin DX E11.59   LEVOTHYROXINE (SYNTHROID, LEVOTHROID) 150 MCG TABLET    Take one tablet 30 minutes before breakfast   LIDOCAINE (LIDODERM) 5 %    Apply patch to bilateral knees at bedtime.  Remove & Discard patch within 12 hours or as directed by MD    LISINOPRIL (PRINIVIL,ZESTRIL) 10 MG TABLET    Take 1 tablet (10 mg total) by mouth 2 (two) times daily.   LOPERAMIDE (IMODIUM A-D) 2 MG TABLET    Take 2 mg by mouth 4 (four) times daily as needed for diarrhea or loose stools.   MELATONIN 10 MG CAPS    Take 1 capsule by mouth at bedtime   METOPROLOL TARTRATE (LOPRESSOR) 25 MG TABLET    Take 1 tablet (25 mg total) by mouth 2 (two) times daily.   NYSTATIN (NYAMYC) 100000 UNIT/GM POWD    Apply 1 g topically  daily as needed (for itching).   NYSTATIN-TRIAMCINOLONE (MYCOLOG II) CREAM    Apply 1 application topically 4 (four) times daily as needed (for rash). Apply under abdomen folds twice daily until rash healed   OMEGA-3 ACID ETHYL ESTERS (LOVAZA) 1 G CAPSULE    TAKE ONE CAPSULE BY MOUTH EVERY DAY   OXYCODONE (OXY IR/ROXICODONE) 5 MG IMMEDIATE RELEASE TABLET    Take one tablet every 6 hours as needed for severe pain, Take 2 tablets at bedtime.   PANTOPRAZOLE (PROTONIX) 40 MG TABLET    Take 1 tablet (40 mg total) by mouth 2 (two) times daily.   PILOCARPINE (SALAGEN) 5 MG TABLET    Take 5 mg by mouth daily. For dry mouth   POLYVINYL ALCOHOL (LIQUIFILM TEARS) 1.4 % OPHTHALMIC SOLUTION    Place 1 drop into both eyes as needed (for dry eyes).    POTASSIUM CHLORIDE (MICRO-K) 10 MEQ CR CAPSULE    TAKE 1 CAPSULE BY MOUTH TWICE DAILY   PROMETHAZINE (PHENERGAN) 12.5 MG TABLET    TAKE 1 TABLET BY MOUTH AS NEEDED FOR SEVERE NAUSEA AS DIRECTED   TIOTROPIUM (SPIRIVA HANDIHALER) 18 MCG INHALATION CAPSULE    Place one capsule into the inhaler and inhale daily for breathing   TRIAMTERENE-HYDROCHLOROTHIAZIDE (MAXZIDE-25) 37.5-25 MG PER TABLET    TAKE 1 TABLET BY MOUTH DAILY FOR BLOOD PRESSURE   VENLAFAXINE XR (EFFEXOR-XR) 37.5 MG 24 HR CAPSULE    TAKE 1 CAPSULE(37.5 MG) BY MOUTH DAILY WITH BREAKFAST   VICTOZA 18 MG/3ML SOPN    ADMINISTER 1.8 MG UNDER THE SKIN DAILY FOR BLOOD SUGAR   VITAMIN D, ERGOCALCIFEROL, (DRISDOL) 50000 UNITS CAPS CAPSULE    Take 1 capsule by mouth 2  (two) times a week.   XELJANZ 5 MG TABS    Take 5 mg by mouth daily. For Rheumatoid Arthritis  Modified Medications   No medications on file  Discontinued Medications   INSULIN PEN NEEDLE 31G X 5 MM MISC    Use with Insulin pen as instructed. Dx E11.59     Physical Exam:  Filed Vitals:   08/11/15 0822  BP: 158/82  Pulse: 74  Resp: 12  Height: 5\' 7"  (1.702 m)  Weight: 271 lb (122.925 kg)  SpO2: 96%   Body mass index is 42.43 kg/(m^2).  Physical Exam  Constitutional: She is oriented to person, place, and time. She appears well-developed and well-nourished. No distress.  HENT:  Head: Normocephalic and atraumatic.  Mouth/Throat: No oropharyngeal exudate.  Eyes: Conjunctivae and EOM are normal. Pupils are equal, round, and reactive to light.  Neck: Normal range of motion. Neck supple. No JVD present.  Cardiovascular: Normal rate and regular rhythm.   Occasional skipped beat heard  Pulmonary/Chest: Effort normal and breath sounds normal.  Abdominal: Soft. Bowel sounds are normal.  Musculoskeletal: She exhibits no edema.  Neurological: She is alert and oriented to person, place, and time.  Skin: Skin is warm and dry.  Psychiatric: She has a normal mood and affect.    Labs reviewed: Basic Metabolic Panel:  Recent Labs  03/16/15 0832 06/25/15 0839 07/28/15 1041  NA 142 138 140  K 4.2 4.4 4.2  CL 98 95* 98  CO2 25 23 24   GLUCOSE 89 211* 116*  BUN 16 22 20   CREATININE 0.86 0.92 0.99  CALCIUM 9.5 9.3 9.6  TSH  --   --  0.344*   Liver Function Tests:  Recent Labs  10/23/14 0925 02/18/15 06/25/15 0839  AST 43* 55*  75*  ALT 31 33 45*  ALKPHOS 122* 128* 132*  BILITOT 0.5  --  0.4  PROT 6.7  --  6.7  ALBUMIN 4.1  --  3.9   No results for input(s): LIPASE, AMYLASE in the last 8760 hours. No results for input(s): AMMONIA in the last 8760 hours. CBC:  Recent Labs  11/03/14 1304 02/18/15 07/28/15 1041  WBC 18.1* 12.0 15.2*  NEUTROABS  --   --  10.5*  HGB  13.7 13.9  --   HCT 42.4 43 42.8  MCV 84.5  --   --   PLT 283 268  --    Lipid Panel:  Recent Labs  10/23/14 0925 03/16/15 0832  CHOL 131 148  HDL 33* 38*  LDLCALC 72 89  TRIG 128 104  CHOLHDL 4.0 3.9   TSH:  Recent Labs  07/28/15 1041  TSH 0.344*   A1C: Lab Results  Component Value Date   HGBA1C 8.4* 06/25/2015     Assessment/Plan 1. Essential hypertension -slightly improved from last visit but still elevated, reports she has been on norvasc 10 mg daily in the past and unsure why she was taken off of this. Tolerated well. -remains on lisinopril, metoprolol, maxizide. Will increase norvasc to 10 mg daily  - amLODipine (NORVASC) 10 MG tablet; Take 1 tablet (10 mg total) by mouth daily.  Dispense: 30 tablet; Refill: 1 -cont dietary modifications -if remains elevated at follow up consider renal US   2. Tachycardia Improved on lopressor, to cont at this time  3. Thyroid disorder TSH low, synthroid reduced to 150 mcg daily -follow up TSH scheduled.   To keep scheduled follow up with Dr Mariea Clonts in January    Carlos American. Harle Battiest  [facility] [phone] 8 am - 5 pm) [phone] (after hours)

## 2015-08-27 ENCOUNTER — Ambulatory Visit (INDEPENDENT_AMBULATORY_CARE_PROVIDER_SITE_OTHER): Payer: Medicare Other | Admitting: Internal Medicine

## 2015-08-27 ENCOUNTER — Encounter: Payer: Self-pay | Admitting: Internal Medicine

## 2015-08-27 VITALS — BP 130/82 | HR 117 | Temp 97.8°F | Resp 20 | Ht 67.0 in | Wt 266.2 lb

## 2015-08-27 DIAGNOSIS — E1142 Type 2 diabetes mellitus with diabetic polyneuropathy: Secondary | ICD-10-CM

## 2015-08-27 DIAGNOSIS — M069 Rheumatoid arthritis, unspecified: Secondary | ICD-10-CM

## 2015-08-27 DIAGNOSIS — Z794 Long term (current) use of insulin: Secondary | ICD-10-CM

## 2015-08-27 DIAGNOSIS — M545 Low back pain, unspecified: Secondary | ICD-10-CM

## 2015-08-27 DIAGNOSIS — I10 Essential (primary) hypertension: Secondary | ICD-10-CM

## 2015-08-27 DIAGNOSIS — R5382 Chronic fatigue, unspecified: Secondary | ICD-10-CM

## 2015-08-27 DIAGNOSIS — G8929 Other chronic pain: Secondary | ICD-10-CM | POA: Diagnosis not present

## 2015-08-27 DIAGNOSIS — J449 Chronic obstructive pulmonary disease, unspecified: Secondary | ICD-10-CM | POA: Diagnosis not present

## 2015-08-27 DIAGNOSIS — G9332 Myalgic encephalomyelitis/chronic fatigue syndrome: Secondary | ICD-10-CM

## 2015-08-27 DIAGNOSIS — E079 Disorder of thyroid, unspecified: Secondary | ICD-10-CM

## 2015-08-27 DIAGNOSIS — J4489 Other specified chronic obstructive pulmonary disease: Secondary | ICD-10-CM

## 2015-08-27 DIAGNOSIS — M797 Fibromyalgia: Secondary | ICD-10-CM

## 2015-08-27 MED ORDER — OXYCODONE HCL 5 MG PO TABS: ORAL_TABLET | ORAL | Status: DC

## 2015-08-27 MED ORDER — HYDROCODONE-ACETAMINOPHEN 10-325 MG PO TABS: ORAL_TABLET | ORAL | Status: DC

## 2015-08-27 NOTE — Progress Notes (Signed)
Patient ID: Katherine Delgado, female   DOB: August 02, 1941, 75 y.o.   MRN: [id number]   Location: [place] Provider: Rexene Edison. Mariea Clonts, D.O., C.M.D.  Goals of Care: Advanced Directive information Does patient have an advance directive?: Yes, Type of Advance Directive: Out of facility DNR (pink MOST or yellow form), Pre-existing out of facility DNR order (yellow form or pink MOST form): Yellow form placed in chart (order not valid for inpatient use)  Chief Complaint  Patient presents with  . Medical Management of Chronic Issues    3 month follow-up for medical management    HPI: Patient is a 75 y.o. female seen in the office today for med mgt of chronic diseases.    Blood pressure: doing better.  130/82 today--great for her.  If exerts herself more, it goes up.  Normally, at goal.    DMII:  Last hba1c 7.2.  Improving.  Seems she has been using her meds and insulin as directed and also has not required recent steroid bursts for her COPD.  victoza has been a huge help.  She has also lost 5 lbs.  Hypothyroidism:  Will get tsh with other labs in feb before visit with Cathey b/c synthroid had to be lowered.    COPD:  No exacerbations over the holidays.  Not out and about much.  Tried to avoid infections.    Pain:  Gives a big sigh.  Generalized aching and muscle aches overall worse.  Has phases.  Continues on her oxycodone for bad breakthrough and hydrocodone for days when she is out and about and still wants pain relief. Still on xeljanz for her RA with benefit.    Bowels:  Fairing well.  Will have bouts of IBS, but recently good.  Will use imodium on bad days.   GERD also better.  Continues on protonix.    Review of Systems:  Review of Systems  Constitutional: Positive for weight loss and malaise/fatigue. Negative for fever and chills.  HENT: Negative for congestion and hearing loss.   Eyes: Negative for blurred vision.  Respiratory: Negative for cough, shortness of breath and  wheezing.   Cardiovascular: Negative for chest pain and leg swelling.  Gastrointestinal: Positive for diarrhea. Negative for heartburn, abdominal pain, constipation, blood in stool and melena.  Genitourinary: Negative for dysuria.  Musculoskeletal: Positive for myalgias and joint pain. Negative for falls.  Skin: Negative for rash.  Neurological: Negative for dizziness, weakness and headaches.  Psychiatric/Behavioral: Positive for depression. Negative for memory loss. The patient does not have insomnia.     Past Medical History  Diagnosis Date  . COPD (chronic obstructive pulmonary disease) ([place])   . Hypertension   . RA (rheumatoid arthritis) ([place])   . Thyroid disease   . Miscarriage 1962  . History of benign thymus tumor   . Back injury   . GERD (gastroesophageal reflux disease)   . Arthritis   . Fibromyalgia   . Diabetes mellitus without complication ([place])   . Thyroid disorder   . Acute infective polyneuritis ([place]) 2002  . Guillain-Barre syndrome ([place])   . Polyneuropathy in diabetes(357.2)   . Morbid obesity ([place])   . Spondylosis, lumbosacral   . Degenerative arthritis   . Dyslipidemia   . Spinal stenosis, lumbar region, without neurogenic claudication   . Candidiasis of skin and nails   . Acute maxillary sinusitis   . Insomnia, unspecified   . Candidiasis of vulva and vagina   . Edema   . Acute  sinusitis, unspecified   . Obstructive chronic bronchitis with acute bronchitis ([place])   . Urinary tract infection, site not specified   . Unspecified hypothyroidism   . Type II or unspecified type diabetes mellitus with peripheral circulatory disorders, uncontrolled(250.72)   . Mixed hyperlipidemia   . Other specified disease of white blood cells   . Depressive disorder, not elsewhere classified   . Chronic pain syndrome   . Tear film insufficiency, unspecified   . Unspecified essential hypertension   . Allergic rhinitis due to pollen   . Unspecified chronic bronchitis ([place])    . Reflux esophagitis   . Diaphragmatic hernia without mention of obstruction or gangrene   . Unspecified pruritic disorder   . Rheumatoid arthritis(714.0)   . Pain in joint, multiple sites   . Stiffness of joints, not elsewhere classified, multiple sites   . Lumbago   . Other malaise and fatigue   . Spontaneous ecchymoses   . Abnormal weight gain   . Shortness of breath   . Cough   . Unspecified urinary incontinence   . Osteopenia, senile   . Osteoporosis     Past Surgical History  Procedure Laterality Date  . Abdominal hysterectomy  1974  . Tonsillectomy    . Cholecystectomy  1984  . Back surgery  1982  . Ovarian cyst surgery  1968  . Thymus tumor    . Knee surgery Bilateral 08/27/2010 (L) and 01/11/2011 (R)  . [place]  . Bronchoscopy  2001  . Abdominal tumor  2002  . Thymus tumor  10/2000  . Appendectomy      Allergies  Allergen Reactions  . Penicillins Hives, Itching, Swelling and Rash  . Sulfamethoxazole-Trimethoprim Itching      Medication List       This list is accurate as of: 08/27/15 12:09 PM.  Always use your most recent med list.               amLODipine 10 MG tablet  Commonly known as:  NORVASC  Take 1 tablet (10 mg total) by mouth daily.     aspirin EC 81 MG tablet  Take 81 mg by mouth daily as needed.     Cholecalciferol 2000 units Tabs  Take 2,000 Units by mouth daily.     DM-Phenylephrine-Acetaminophen 10-5-325 MG Caps  Take 1 capsule by mouth daily as needed (for cold).     furosemide 20 MG tablet  Commonly known as:  LASIX  Take 1 tablet (20 mg total) by mouth 2 (two) times daily as needed. Use as needed for 3 pounds gain in one day or 5 pounds in one week     gabapentin 600 MG tablet  Commonly known as:  NEURONTIN  TAKE 1 TABLET BY MOUTH THREE TIMES DAILY     glucose blood test strip  Commonly known as:  ACCU-CHEK SMARTVIEW  Use as instructed     HYDROcodone-acetaminophen 10-325 MG tablet  Commonly known as:  NORCO   Take one tablet every three hours as needed for pain     Insulin Glargine 300 UNIT/ML Sopn  Commonly known as:  TOUJEO SOLOSTAR  Inject 100 Units into the skin daily.     Insulin Lispro 200 UNIT/ML Sopn  Commonly known as:  HUMALOG KWIKPEN  Inject 20 Units into the skin 3 (three) times daily before meals.     Insulin Syringe-Needle U-100 31G X 5/16" 1 ML Misc  Use once daily with administration of Insulin DX E11.59  levothyroxine 150 MCG tablet  Commonly known as:  SYNTHROID, LEVOTHROID  Take one tablet 30 minutes before breakfast     lidocaine 5 %  Commonly known as:  LIDODERM  Apply patch to bilateral knees at bedtime.  Remove & Discard patch within 12 hours or as directed by MD     lisinopril 10 MG tablet  Commonly known as:  PRINIVIL,ZESTRIL  Take 1 tablet (10 mg total) by mouth 2 (two) times daily.     loperamide 2 MG tablet  Commonly known as:  IMODIUM A-D  Take 2 mg by mouth 4 (four) times daily as needed for diarrhea or loose stools.     Melatonin 10 MG Caps  Take 1 capsule by mouth at bedtime     metoprolol tartrate 25 MG tablet  Commonly known as:  LOPRESSOR  Take 1 tablet (25 mg total) by mouth 2 (two) times daily.     NYAMYC 100000 UNIT/GM Powd  Apply 1 g topically daily as needed (for itching).     nystatin-triamcinolone cream  Commonly known as:  MYCOLOG II  Apply 1 application topically 4 (four) times daily as needed (for rash). Apply under abdomen folds twice daily until rash healed     omega-3 acid ethyl esters 1 g capsule  Commonly known as:  LOVAZA  TAKE ONE CAPSULE BY MOUTH EVERY DAY     oxyCODONE 5 MG immediate release tablet  Commonly known as:  Oxy IR/ROXICODONE  Take one tablet every 6 hours as needed for severe pain, Take 2 tablets at bedtime.     pantoprazole 40 MG tablet  Commonly known as:  PROTONIX  Take 1 tablet (40 mg total) by mouth 2 (two) times daily.     pilocarpine 5 MG tablet  Commonly known as:  SALAGEN  Take 5 mg by  mouth daily. For dry mouth     polyvinyl alcohol 1.4 % ophthalmic solution  Commonly known as:  LIQUIFILM TEARS  Place 1 drop into both eyes as needed (for dry eyes).     potassium chloride 10 MEQ CR capsule  Commonly known as:  MICRO-K  TAKE 1 CAPSULE BY MOUTH TWICE DAILY     promethazine 12.5 MG tablet  Commonly known as:  PHENERGAN  TAKE 1 TABLET BY MOUTH AS NEEDED FOR SEVERE NAUSEA AS DIRECTED     tiotropium 18 MCG inhalation capsule  Commonly known as:  SPIRIVA HANDIHALER  Place one capsule into the inhaler and inhale daily for breathing     triamterene-hydrochlorothiazide 37.5-25 MG tablet  Commonly known as:  MAXZIDE-25  TAKE 1 TABLET BY MOUTH DAILY FOR BLOOD PRESSURE     venlafaxine XR 37.5 MG 24 hr capsule  Commonly known as:  EFFEXOR-XR  TAKE 1 CAPSULE(37.5 MG) BY MOUTH DAILY WITH BREAKFAST     VICTOZA 18 MG/3ML Sopn  Generic drug:  Liraglutide  ADMINISTER 1.8 MG UNDER THE SKIN DAILY FOR BLOOD SUGAR     Vitamin D (Ergocalciferol) 50000 units Caps capsule  Commonly known as:  DRISDOL  Take 1 capsule by mouth 2 (two) times a week.     XELJANZ 5 MG Tabs  Generic drug:  Tofacitinib Citrate  Take 5 mg by mouth daily. For Rheumatoid Arthritis        Health Maintenance  Topic Date Due  . OPHTHALMOLOGY EXAM  12/20/1950  . TETANUS/TDAP  12/20/1959  . PNA vac Low Risk Adult (1 of 2 - PCV13) 12/19/2005  . ZOSTAVAX  03/04/2016 (Originally 12/19/2000)  .  INFLUENZA VACCINE  05/16/2016 (Originally 03/23/2015)  . HEMOGLOBIN A1C  12/23/2015  . FOOT EXAM  05/21/2016  . MAMMOGRAM  06/19/2017  . COLONOSCOPY  02/10/2025  . DEXA SCAN  Completed    Physical Exam: Filed Vitals:   08/27/15 1200  BP: 130/82  Pulse: 117  Temp: 97.8 F (36.6 C)  TempSrc: Oral  Resp: 20  Height: 5\' 7"  (1.702 m)  Weight: 266 lb 3.2 oz (120.748 kg)  SpO2: 95%   Body mass index is 41.68 kg/(m^2). Physical Exam  Constitutional: She is oriented to person, place, and time. She appears  well-developed and well-nourished. No distress.  HENT:  Head: Normocephalic and atraumatic.  Cardiovascular: Normal rate, regular rhythm, normal heart sounds and intact distal pulses.   Pulmonary/Chest: Effort normal and breath sounds normal. She has no wheezes. She has no rales.  Abdominal: Soft. Bowel sounds are normal.  Musculoskeletal: Normal range of motion.  Has cane with her only today  Neurological: She is alert and oriented to person, place, and time.  Skin: Skin is warm and dry.  Psychiatric: She has a normal mood and affect.  Spirits seem better right now, not tearful    Labs reviewed: Basic Metabolic Panel:  Recent Labs  03/16/15 0832 06/25/15 0839 07/28/15 1041  NA 142 138 140  K 4.2 4.4 4.2  CL 98 95* 98  CO2 25 23 24   GLUCOSE 89 211* 116*  BUN 16 22 20   CREATININE 0.86 0.92 0.99  CALCIUM 9.5 9.3 9.6  TSH  --   --  0.344*   Liver Function Tests:  Recent Labs  10/23/14 0925 02/18/15 06/25/15 0839  AST 43* 55* 75*  ALT 31 33 45*  ALKPHOS 122* 128* 132*  BILITOT 0.5  --  0.4  PROT 6.7  --  6.7  ALBUMIN 4.1  --  3.9   No results for input(s): LIPASE, AMYLASE in the last 8760 hours. No results for input(s): AMMONIA in the last 8760 hours. CBC:  Recent Labs  11/03/14 1304 02/18/15 07/28/15 1041  WBC 18.1* 12.0 15.2*  NEUTROABS  --   --  10.5*  HGB 13.7 13.9  --   HCT 42.4 43 42.8  MCV 84.5  --   --   PLT 283 268  --    Lipid Panel:  Recent Labs  10/23/14 0925 03/16/15 0832  CHOL 131 148  HDL 33* 38*  LDLCALC 72 89  TRIG 128 104  CHOLHDL 4.0 3.9   Lab Results  Component Value Date   HGBA1C 8.4* 06/25/2015   Assessment/Plan 1. Chronic fatigue fibromyalgia syndrome - continues on her chronic pain meds and xeljanz for RA -no changes to regimen today - oxyCODONE (OXY IR/ROXICODONE) 5 MG immediate release tablet; Take one tablet every 6 hours as needed for severe pain, Take 2 tablets at bedtime.  Dispense: 180 tablet; Refill: 0  2.  Chronic low back pain - cont same routine, has lost a few lbs and this would really help this in the long run if she can keep it going (likely victoza is reason) - oxyCODONE (OXY IR/ROXICODONE) 5 MG immediate release tablet; Take one tablet every 6 hours as needed for severe pain, Take 2 tablets at bedtime.  Dispense: 180 tablet; Refill: 0 - HYDROcodone-acetaminophen (NORCO) 10-325 MG tablet; Take one tablet every three hours as needed for pain  Dispense: 240 tablet; Refill: 0  3. Type 2 diabetes mellitus with diabetic polyneuropathy, with long-term current use of insulin ([facility]) -improving control  with victoza added to regimen, insulin, no recent copd exacerbations requiring additional steroids, has lost 5 lbs also -cont same regimen and hopefully this trend will continue -remains relatively inactive  -says she has also changed her diet some  4. Obstructive chronic bronchitis without exacerbation ([place]) -stable lately without exacerbations which is unusual for her -cont same regimen -if she has another flare, changing her inhalers or involving pulmonary might be best  5. Rheumatoid arthritis involving both hips, unspecified rheumatoid factor presence ([place]) -seems pretty good right now, but she says it's not -cont xeljanz and her hydrocodone/oxycodone regimen  6. Thyroid disorder -when labs returned, Janett Billow decreased her synthroid to 151mcg with f/u tsh to be ordered for 6 wks--this was to be added to her 09/25/15 labs when they are drawn before pt sees Cathey next  7. Essential hypertension -bp excellent today, hr up at arrival, but improved over visit, monitor -cont same regimen  Labs/tests ordered: already has labs upcoming Next appt:  11/27/2015 for med mgt   Lennart Gladish L. Keaunna Skipper, D.O. [facility] [address], [place] [address] Cell Phone (Mon-Fri 8am-5pm):  [phone] On Call:  [phone] & follow prompts after 5pm &  weekends Office Phone:  [phone] Office Fax:  [phone]

## 2015-09-07 DIAGNOSIS — M79641 Pain in right hand: Secondary | ICD-10-CM | POA: Diagnosis not present

## 2015-09-07 DIAGNOSIS — M79642 Pain in left hand: Secondary | ICD-10-CM | POA: Diagnosis not present

## 2015-09-07 DIAGNOSIS — M79671 Pain in right foot: Secondary | ICD-10-CM | POA: Diagnosis not present

## 2015-09-07 DIAGNOSIS — R748 Abnormal levels of other serum enzymes: Secondary | ICD-10-CM | POA: Diagnosis not present

## 2015-09-07 DIAGNOSIS — M25561 Pain in right knee: Secondary | ICD-10-CM | POA: Diagnosis not present

## 2015-09-07 DIAGNOSIS — M79672 Pain in left foot: Secondary | ICD-10-CM | POA: Diagnosis not present

## 2015-09-11 ENCOUNTER — Other Ambulatory Visit: Payer: Self-pay

## 2015-09-14 ENCOUNTER — Other Ambulatory Visit: Payer: Self-pay | Admitting: Internal Medicine

## 2015-09-21 DIAGNOSIS — M810 Age-related osteoporosis without current pathological fracture: Secondary | ICD-10-CM | POA: Diagnosis not present

## 2015-09-21 DIAGNOSIS — M85851 Other specified disorders of bone density and structure, right thigh: Secondary | ICD-10-CM | POA: Diagnosis not present

## 2015-09-21 LAB — HM DEXA SCAN

## 2015-09-23 ENCOUNTER — Encounter: Payer: Self-pay | Admitting: *Deleted

## 2015-09-24 ENCOUNTER — Telehealth: Payer: Self-pay

## 2015-09-24 ENCOUNTER — Other Ambulatory Visit: Payer: Medicare Other

## 2015-09-24 NOTE — Telephone Encounter (Signed)
Called patient to give the following results and instructions.  1. Osteopenia-hips 2. Take caltrate with D 600/400 by mouth twice daily in addition to vitamin D.   Patient stated that she understood and would discuss the finding of her bone density study in more detail at her next appointment.

## 2015-09-25 ENCOUNTER — Other Ambulatory Visit: Payer: Self-pay | Admitting: *Deleted

## 2015-09-25 ENCOUNTER — Other Ambulatory Visit: Payer: Medicare Other

## 2015-09-25 DIAGNOSIS — M545 Low back pain, unspecified: Secondary | ICD-10-CM

## 2015-09-25 DIAGNOSIS — E079 Disorder of thyroid, unspecified: Secondary | ICD-10-CM

## 2015-09-25 DIAGNOSIS — E1142 Type 2 diabetes mellitus with diabetic polyneuropathy: Secondary | ICD-10-CM | POA: Diagnosis not present

## 2015-09-25 DIAGNOSIS — M797 Fibromyalgia: Secondary | ICD-10-CM

## 2015-09-25 DIAGNOSIS — G9332 Myalgic encephalomyelitis/chronic fatigue syndrome: Secondary | ICD-10-CM

## 2015-09-25 DIAGNOSIS — G8929 Other chronic pain: Secondary | ICD-10-CM

## 2015-09-25 DIAGNOSIS — R5382 Chronic fatigue, unspecified: Secondary | ICD-10-CM

## 2015-09-25 MED ORDER — OXYCODONE HCL 5 MG PO TABS: ORAL_TABLET | ORAL | Status: DC

## 2015-09-25 MED ORDER — HYDROCODONE-ACETAMINOPHEN 10-325 MG PO TABS: ORAL_TABLET | ORAL | Status: DC

## 2015-09-25 NOTE — Telephone Encounter (Signed)
Patient requested and will pick up 

## 2015-09-26 LAB — COMPREHENSIVE METABOLIC PANEL
ALT: 43 IU/L — ABNORMAL HIGH (ref 0–32)
AST: 62 IU/L — ABNORMAL HIGH (ref 0–40)
Albumin/Globulin Ratio: 1.4 (ref 1.1–2.5)
Albumin: 4.2 g/dL (ref 3.5–4.8)
Alkaline Phosphatase: 124 IU/L — ABNORMAL HIGH (ref 39–117)
BUN/Creatinine Ratio: 19 (ref 11–26)
BUN: 20 mg/dL (ref 8–27)
Bilirubin Total: 0.3 mg/dL (ref 0.0–1.2)
CO2: 26 mmol/L (ref 18–29)
Calcium: 9.7 mg/dL (ref 8.7–10.3)
Chloride: 95 mmol/L — ABNORMAL LOW (ref 96–106)
Creatinine, Ser: 1.05 mg/dL — ABNORMAL HIGH (ref 0.57–1.00)
GFR calc Af Amer: 60 mL/min/{1.73_m2} (ref >59)
GFR calc non Af Amer: 52 mL/min/{1.73_m2} — ABNORMAL LOW (ref >59)
Globulin, Total: 3 g/dL (ref 1.5–4.5)
Glucose: 54 mg/dL — ABNORMAL LOW (ref 65–99)
Potassium: 3.9 mmol/L (ref 3.5–5.2)
Sodium: 141 mmol/L (ref 134–144)
Total Protein: 7.2 g/dL (ref 6.0–8.5)

## 2015-09-26 LAB — HEMOGLOBIN A1C
Est. average glucose Bld gHb Est-mCnc: 203 mg/dL
Hgb A1c MFr Bld: 8.7 % — ABNORMAL HIGH (ref 4.8–5.6)

## 2015-09-26 LAB — MICROALBUMIN / CREATININE URINE RATIO
Creatinine, Urine: 98.8 mg/dL
MICROALB/CREAT RATIO: 16.8 mg/g creat (ref 0.0–30.0)
Microalbumin, Urine: 16.6 ug/mL

## 2015-09-26 LAB — TSH: TSH: 0.558 u[IU]/mL (ref 0.450–4.500)

## 2015-09-28 ENCOUNTER — Ambulatory Visit (INDEPENDENT_AMBULATORY_CARE_PROVIDER_SITE_OTHER): Payer: Medicare Other | Admitting: Pharmacotherapy

## 2015-09-28 ENCOUNTER — Encounter: Payer: Self-pay | Admitting: Pharmacotherapy

## 2015-09-28 VITALS — BP 128/70 | HR 84 | Temp 97.8°F | Resp 20 | Ht 67.0 in | Wt 272.8 lb

## 2015-09-28 DIAGNOSIS — I10 Essential (primary) hypertension: Secondary | ICD-10-CM | POA: Diagnosis not present

## 2015-09-28 DIAGNOSIS — E1142 Type 2 diabetes mellitus with diabetic polyneuropathy: Secondary | ICD-10-CM

## 2015-09-28 DIAGNOSIS — Z794 Long term (current) use of insulin: Secondary | ICD-10-CM

## 2015-09-28 MED ORDER — INSULIN GLARGINE 300 UNIT/ML ~~LOC~~ SOPN: 70.0000 [IU] | PEN_INJECTOR | Freq: Every day | SUBCUTANEOUS | Status: DC

## 2015-09-28 NOTE — Progress Notes (Signed)
  Subjective:    Katherine Delgado is a 75 y.o.white female who presents for follow-up of Type 2 diabetes mellitus.   A1C is up to 8.7% (was 8.4%)  Forgot to bring blood glucose meter. She says BG 150-200. 1 episode of hypoglycemia - BG 54mg /dl  Had her demonstrate her pen technique - She has only been getting 50 units of glargine. Continues to get Humalog 20 units with each meal.  Continues Victoza.  Making healthy food choices. No routine exercise. Denies problems with vision Has peripheral edema. Does have peripheral neuropathy. Nocturia twice per night.  Review of Systems A comprehensive review of systems was negative except for: Cardiovascular: positive for lower extremity edema Genitourinary: positive for nocturia Musculoskeletal: positive for see HPI - area on bottom of right foot    Objective:    BP 128/70 mmHg  Pulse 84  Temp(Src) 97.8 F (36.6 C) (Oral)  Resp 20  Ht 5\' 7"  (1.702 m)  Wt 272 lb 12.8 oz (123.741 kg)  BMI 42.72 kg/m2  SpO2 92%  General:  alert, cooperative and no distress  Oropharynx: normal findings: lips normal without lesions and gums healthy   Eyes:  negative findings: lids and lashes normal and conjunctivae and sclerae normal   Ears:  external ears normal        Lung: clear to auscultation bilaterally  Heart:  regular rate and rhythm     Extremities: edema bilateral lower extremities, has small red, tender spot and thick callus on bottom of right foot examined by Dr. Mariea Clonts.  Skin: dry     Neuro: mental status, speech normal, alert and oriented x3 and limping   Lab Review GLUCOSE (mg/dL)  Date Value  09/25/2015 54*  07/28/2015 116*  06/25/2015 211*   GLUCOSE, BLD (mg/dL)  Date Value  11/03/2014 148*  10/20/2011 180*   CO2 (mmol/L)  Date Value  09/25/2015 26  07/28/2015 24  06/25/2015 23   BUN (mg/dL)  Date Value  09/25/2015 20  07/28/2015 20  06/25/2015 22  11/03/2014 15  10/20/2011 28*   CREATININE (mg/dL)  Date Value   02/18/2015 0.9   CREATININE, SER (mg/dL)  Date Value  09/25/2015 1.05*  07/28/2015 0.99  06/25/2015 0.92       Assessment:    Diabetes Mellitus type II, under poor control. A1C above goal <7% due to not taking Toujeo as instructed. BP at goal <140/90 Red area, thick callus under right foot - examined by Dr Mariea Clonts.  Referred to podiatry.   Plan:    1.  Rx changes: Toujeo 70 units daily.  Instructed on good injection technique. 2.  Evaluated injection technique.  She was getting confused as she up dialed the [facility] and was adding the numbers that appear when the dial is extended - so when 50 is in the dose window, 30 and 10 are visible.  She was adding 50 + 30 + 10. 3.  Continue Humalog 20 units with meal. 4.  Continue Victoza 1.8mg  daily. 5.  Counseled on nutrition goals. 6.  Counseled on importance of routine exercise.  Goal is 30-45 minutes 5 x week. 7.  Counseled on foot care.  Referred to podiatry for spot under right foot. 8.  BP at goal <140/90.

## 2015-09-28 NOTE — Patient Instructions (Signed)
Take Toujeo 70 units daily Need to exercise daily  Referring to podiatrist

## 2015-10-01 ENCOUNTER — Ambulatory Visit (INDEPENDENT_AMBULATORY_CARE_PROVIDER_SITE_OTHER): Payer: Medicare Other | Admitting: Podiatry

## 2015-10-01 ENCOUNTER — Encounter: Payer: Self-pay | Admitting: Podiatry

## 2015-10-01 VITALS — BP 132/71 | HR 76 | Resp 14

## 2015-10-01 DIAGNOSIS — L84 Corns and callosities: Secondary | ICD-10-CM | POA: Diagnosis not present

## 2015-10-01 DIAGNOSIS — L989 Disorder of the skin and subcutaneous tissue, unspecified: Secondary | ICD-10-CM | POA: Diagnosis not present

## 2015-10-01 NOTE — Progress Notes (Signed)
   Subjective:    Patient ID: Katherine Delgado, female    DOB: 02-11-1941, 75 y.o.   MRN: [id number]  HPI this patient presents to the office with chief complaint of a painful callus or skin lesion under the ball of her left foot. She was referred to the office by her medical doctor for an evaluation of this lesion. She says the lesion has been present for 2 weeks and she used her own instrument to do bride the skin lesion. She was seen yesterday by her medical doctor who recommended she be seen immediately by a foot doctor for this skin lesion. She presents the office stating that there is a central  area of pain in the skin lesion. She denies any drainage from this area under the ball of her left foot. She presents for an evaluation of this condition This patient has history of DM and RA.   Review of Systems  All other systems reviewed and are negative.      Objective:   Physical Exam GENERAL APPEARANCE: Alert, conversant. Appropriately groomed. No acute distress.  VASCULAR: Pedal pulses palpable at  [facility] and PT right foot.  Absent pulses left foot..  Capillary refill time is immediate to all digits,  Normal temperature gradient.   NEUROLOGIC: sensation is diminished to 5.07 monofilament at 5/5 sites bilateral.  Light touch is intact bilateral, Muscle strength normal.  MUSCULOSKELETAL: acceptable muscle strength, tone and stability bilateral.  Intrinsic muscluature intact bilateral.  Rectus appearance of foot and digits noted bilateral. Lipoma noted over lateral malleolar left ankle.  DERMATOLOGIC: skin color, texture, and turgor are within normal limits.  No preulcerative lesions or ulcers  are seen, no interdigital maceration noted.  No open lesions present.  Digital nails are asymptomatic. No drainage noted.         Assessment & Plan:  Skin lesion left foot.  IE  Debride skin lesion.  Add padding to site.  Recommend home soaks.  RTC 2 weeks.  Will address her right hallux toenail at the  next visit.   Gardiner Barefoot DPM

## 2015-10-15 ENCOUNTER — Ambulatory Visit (INDEPENDENT_AMBULATORY_CARE_PROVIDER_SITE_OTHER): Payer: Medicare Other | Admitting: Podiatry

## 2015-10-15 VITALS — BP 146/68 | HR 82 | Resp 14

## 2015-10-15 DIAGNOSIS — B351 Tinea unguium: Secondary | ICD-10-CM | POA: Diagnosis not present

## 2015-10-15 DIAGNOSIS — M79676 Pain in unspecified toe(s): Secondary | ICD-10-CM

## 2015-10-15 DIAGNOSIS — M79674 Pain in right toe(s): Secondary | ICD-10-CM

## 2015-10-15 DIAGNOSIS — L989 Disorder of the skin and subcutaneous tissue, unspecified: Secondary | ICD-10-CM

## 2015-10-15 NOTE — Progress Notes (Signed)
Subjective:     Patient ID: Katherine Delgado, female   DOB: 1941/07/11, 75 y.o.   MRN: [id number]  HPI this diabetic patient returns to the office follow-up for a painful skin lesion under the ball of her left foot. She has received a dispersion pad and told to soak her foot at home states that he feels better and she is not having any pain or discomfort. No drainage is present from the skin lesion. She is very pleased with her improvement. She also says that she has a long thick painful toenail on her right hallux. This is painful walking and wearing her shoes. She presents for an evaluation of this nail and possible surgical consideration. Patient does have a history of DM and RA.   Review of Systems     Objective:   Physical Exam     Physical Exam GENERAL APPEARANCE: Alert, conversant. Appropriately groomed. No acute distress.  VASCULAR: Pedal pulses palpable at [facility] and PT right foot. Absent pulses left foot.. Capillary refill time is immediate to all digits, Normal temperature gradient.  NEUROLOGIC: sensation is diminished to 5.07 monofilament at 5/5 sites bilateral. Light touch is intact bilateral, Muscle strength normal.  MUSCULOSKELETAL: acceptable muscle strength, tone and stability bilateral. Intrinsic muscluature intact bilateral. Rectus appearance of foot and digits noted bilateral. Lipoma noted over lateral malleolar left ankle.  DERMATOLOGIC: skin color, texture, and turgor are within normal limits. No preulcerative lesions or ulcers are seen, no interdigital maceration noted. No open lesions present. . No drainage noted.      NAILS  Thick disfigured discolored hallux toenail right foot.      Assessment:     Skin lesion left foot  Onychomycosis right hallux     Plan:     Debridement of nail.  Decided to treat conservatively due to her medical conditions. Skin lesion has healed.  Told her to pick up spenco insoles and use as padding.   Gardiner Barefoot DPM

## 2015-10-23 ENCOUNTER — Other Ambulatory Visit: Payer: Self-pay | Admitting: *Deleted

## 2015-10-23 DIAGNOSIS — G8929 Other chronic pain: Secondary | ICD-10-CM

## 2015-10-23 DIAGNOSIS — G9332 Myalgic encephalomyelitis/chronic fatigue syndrome: Secondary | ICD-10-CM

## 2015-10-23 DIAGNOSIS — M797 Fibromyalgia: Secondary | ICD-10-CM

## 2015-10-23 DIAGNOSIS — R5382 Chronic fatigue, unspecified: Principal | ICD-10-CM

## 2015-10-23 DIAGNOSIS — M545 Low back pain: Secondary | ICD-10-CM

## 2015-10-23 MED ORDER — OXYCODONE HCL 5 MG PO TABS: ORAL_TABLET | ORAL | Status: DC

## 2015-10-23 MED ORDER — HYDROCODONE-ACETAMINOPHEN 10-325 MG PO TABS: ORAL_TABLET | ORAL | Status: DC

## 2015-10-23 NOTE — Telephone Encounter (Signed)
Patient requested and will pick up 

## 2015-10-26 ENCOUNTER — Other Ambulatory Visit: Payer: Self-pay | Admitting: Nurse Practitioner

## 2015-10-26 ENCOUNTER — Other Ambulatory Visit: Payer: Self-pay | Admitting: Internal Medicine

## 2015-11-23 ENCOUNTER — Other Ambulatory Visit: Payer: Self-pay | Admitting: Nurse Practitioner

## 2015-11-23 ENCOUNTER — Other Ambulatory Visit: Payer: Self-pay | Admitting: Internal Medicine

## 2015-11-27 ENCOUNTER — Encounter: Payer: Self-pay | Admitting: Internal Medicine

## 2015-11-27 ENCOUNTER — Ambulatory Visit (INDEPENDENT_AMBULATORY_CARE_PROVIDER_SITE_OTHER): Payer: Medicare Other | Admitting: Internal Medicine

## 2015-11-27 VITALS — BP 144/62 | HR 81 | Temp 98.1°F | Ht 67.0 in | Wt 270.0 lb

## 2015-11-27 DIAGNOSIS — M545 Low back pain: Secondary | ICD-10-CM

## 2015-11-27 DIAGNOSIS — G8929 Other chronic pain: Secondary | ICD-10-CM

## 2015-11-27 DIAGNOSIS — M810 Age-related osteoporosis without current pathological fracture: Secondary | ICD-10-CM | POA: Diagnosis not present

## 2015-11-27 DIAGNOSIS — Z794 Long term (current) use of insulin: Secondary | ICD-10-CM

## 2015-11-27 DIAGNOSIS — E1142 Type 2 diabetes mellitus with diabetic polyneuropathy: Secondary | ICD-10-CM | POA: Diagnosis not present

## 2015-11-27 DIAGNOSIS — E1151 Type 2 diabetes mellitus with diabetic peripheral angiopathy without gangrene: Secondary | ICD-10-CM | POA: Insufficient documentation

## 2015-11-27 DIAGNOSIS — I10 Essential (primary) hypertension: Secondary | ICD-10-CM

## 2015-11-27 DIAGNOSIS — R5382 Chronic fatigue, unspecified: Secondary | ICD-10-CM | POA: Diagnosis not present

## 2015-11-27 DIAGNOSIS — M797 Fibromyalgia: Secondary | ICD-10-CM

## 2015-11-27 DIAGNOSIS — G9332 Myalgic encephalomyelitis/chronic fatigue syndrome: Secondary | ICD-10-CM

## 2015-11-27 MED ORDER — TETANUS-DIPHTH-ACELL PERTUSSIS 5-2.5-18.5 LF-MCG/0.5 IM SUSP: 0.5000 mL | Freq: Once | INTRAMUSCULAR | Status: DC

## 2015-11-27 MED ORDER — CALCIUM CARBONATE-VITAMIN D 600-400 MG-UNIT PO TABS: 1.0000 | ORAL_TABLET | Freq: Every day | ORAL | Status: DC

## 2015-11-27 MED ORDER — HYDROCODONE-ACETAMINOPHEN 10-325 MG PO TABS: ORAL_TABLET | ORAL | Status: DC

## 2015-11-27 MED ORDER — OXYCODONE HCL 5 MG PO TABS: ORAL_TABLET | ORAL | Status: DC

## 2015-11-27 NOTE — Progress Notes (Signed)
Patient ID: Katherine Delgado, female   DOB: [birth year], 75 y.o.   MRN: [id number]   Location:  [facility] clinic Provider:  Etter Royall L. Mariea Clonts, D.O., C.M.D.  Code Status: DNR Goals of Care:  Advanced Directives 08/27/2015  Does patient have an advance directive? Yes  Type of Advance Directive Out of facility DNR (pink MOST or yellow form)  Copy of advanced directive(s) in chart? Yes  Pre-existing out of facility DNR order (yellow form or pink MOST form) Yellow form placed in chart (order not valid for inpatient use)     Chief Complaint  Patient presents with  . Medical Management of Chronic Issues    Medical Management of Chronic Issues. 3 month follow up    HPI: Patient is a 75 y.o. female seen today for medical management of chronic diseases.    80 same old things going on.  One thing will be worse, then better and another is worse.    Three weeks ago, she thinks she had a touch of the flu.  Took everything on the drugstore shelf and it went away.  Took alka seltzer plus cold in the past 24 hrs.    Same sleeping problems, aches and pains.    Spirits have remained stable lately despite her multiple medical problems.  DMII:  hba1c was up to 8.7.  Next sees Cathey May 8th.  She was using the pen incorrectly and glucose values are much better, but having some lows.  She has managed to even this out so she is not getting the lows now.  Wt down 1lb.    Has senile osteoporosis of hips:  Add on the ca with D to the D she is already taking.  Dr. Estanislado Pandy to determine if she can take prolia.    Tachycardia:  Better.  BP also improved usually but took alka seltzer cold today.  Past Medical History  Diagnosis Date  . COPD (chronic obstructive pulmonary disease) ([place])   . Hypertension   . RA (rheumatoid arthritis) ([place])   . Thyroid disease   . Miscarriage 1962  . History of benign thymus tumor   . Back injury   . GERD (gastroesophageal reflux disease)   . Arthritis   . Fibromyalgia   .  Diabetes mellitus without complication ([place])   . Thyroid disorder   . Acute infective polyneuritis ([place]) 2002  . Guillain-Barre syndrome ([place])   . Polyneuropathy in diabetes(357.2)   . Morbid obesity ([place])   . Spondylosis, lumbosacral   . Degenerative arthritis   . Dyslipidemia   . Spinal stenosis, lumbar region, without neurogenic claudication   . Candidiasis of skin and nails   . Acute maxillary sinusitis   . Insomnia, unspecified   . Candidiasis of vulva and vagina   . Edema   . Acute sinusitis, unspecified   . Obstructive chronic bronchitis with acute bronchitis ([place])   . Urinary tract infection, site not specified   . Unspecified hypothyroidism   . Type II or unspecified type diabetes mellitus with peripheral circulatory disorders, uncontrolled(250.72)   . Mixed hyperlipidemia   . Other specified disease of white blood cells   . Depressive disorder, not elsewhere classified   . Chronic pain syndrome   . Tear film insufficiency, unspecified   . Unspecified essential hypertension   . Allergic rhinitis due to pollen   . Unspecified chronic bronchitis ([place])   . Reflux esophagitis   . Diaphragmatic hernia without mention of obstruction or gangrene   . Unspecified  pruritic disorder   . Rheumatoid arthritis(714.0)   . Pain in joint, multiple sites   . Stiffness of joints, not elsewhere classified, multiple sites   . Lumbago   . Other malaise and fatigue   . Spontaneous ecchymoses   . Abnormal weight gain   . Shortness of breath   . Cough   . Unspecified urinary incontinence   . Osteopenia, senile   . Osteoporosis     Past Surgical History  Procedure Laterality Date  . Abdominal hysterectomy  1974  . Tonsillectomy    . Cholecystectomy  1984  . Back surgery  1982  . Ovarian cyst surgery  1968  . Thymus tumor    . Knee surgery Bilateral 08/27/2010 (L) and 01/11/2011 (R)  . [place]  . Bronchoscopy  2001  . Abdominal tumor  2002  . Thymus tumor  10/2000  .  Appendectomy      Allergies  Allergen Reactions  . Penicillins Hives, Itching, Swelling and Rash  . Sulfamethoxazole-Trimethoprim Itching      Medication List       This list is accurate as of: 11/27/15 11:20 AM.  Always use your most recent med list.               amLODipine 10 MG tablet  Commonly known as:  NORVASC  Take 1 tablet (10 mg total) by mouth daily.     aspirin EC 81 MG tablet  Take 81 mg by mouth daily as needed.     Cholecalciferol 2000 units Tabs  Take 2,000 Units by mouth daily.     DM-Phenylephrine-Acetaminophen 10-5-325 MG Caps  Take 1 capsule by mouth daily as needed (for cold).     furosemide 20 MG tablet  Commonly known as:  LASIX  Take 1 tablet (20 mg total) by mouth 2 (two) times daily as needed. Use as needed for 3 pounds gain in one day or 5 pounds in one week     gabapentin 600 MG tablet  Commonly known as:  NEURONTIN  TAKE 1 TABLET BY MOUTH THREE TIMES DAILY     glucose blood test strip  Commonly known as:  ACCU-CHEK SMARTVIEW  Use as instructed     HYDROcodone-acetaminophen 10-325 MG tablet  Commonly known as:  NORCO  Take one tablet every three hours as needed for pain     Insulin Glargine 300 UNIT/ML Sopn  Commonly known as:  TOUJEO SOLOSTAR  Inject 70 Units into the skin daily.     Insulin Lispro 200 UNIT/ML Sopn  Commonly known as:  HUMALOG KWIKPEN  Inject 20 Units into the skin 3 (three) times daily before meals.     Insulin Syringe-Needle U-100 31G X 5/16" 1 ML Misc  Use once daily with administration of Insulin DX E11.59     levothyroxine 150 MCG tablet  Commonly known as:  SYNTHROID, LEVOTHROID  Take one tablet 30 minutes before breakfast     lidocaine 5 %  Commonly known as:  LIDODERM  Apply patch to bilateral knees at bedtime.  Remove & Discard patch within 12 hours or as directed by MD     lisinopril 10 MG tablet  Commonly known as:  PRINIVIL,ZESTRIL  Take 1 tablet (10 mg total) by mouth 2 (two) times daily.       loperamide 2 MG tablet  Commonly known as:  IMODIUM A-D  Take 2 mg by mouth 4 (four) times daily as needed for diarrhea or loose stools.  Melatonin 10 MG Caps  Take 1 capsule by mouth at bedtime     metoprolol tartrate 25 MG tablet  Commonly known as:  LOPRESSOR  TAKE 1 TABLET(25 MG) BY MOUTH TWICE DAILY     nystatin 100000 UNIT/GM Powd  APPLY 1 GRAM TOPICALLY TO THE AFFECTED AREA DAILY AS NEEDED FOR ITCHING     nystatin-triamcinolone cream  Commonly known as:  MYCOLOG II  Apply 1 application topically 4 (four) times daily as needed (for rash). Apply under abdomen folds twice daily until rash healed     olopatadine 0.1 % ophthalmic solution  Commonly known as:  PATANOL  INSTILL 1 DROP IN RIGHT EYE TWICE DAILY FOR 1 WEEK     omega-3 acid ethyl esters 1 g capsule  Commonly known as:  LOVAZA  TAKE ONE CAPSULE BY MOUTH EVERY DAY     oxyCODONE 5 MG immediate release tablet  Commonly known as:  Oxy IR/ROXICODONE  Take one tablet every 6 hours as needed for severe pain, Take 2 tablets at bedtime.     pantoprazole 40 MG tablet  Commonly known as:  PROTONIX  Take 1 tablet (40 mg total) by mouth 2 (two) times daily.     pilocarpine 5 MG tablet  Commonly known as:  SALAGEN  Take 5 mg by mouth daily. For dry mouth     polyvinyl alcohol 1.4 % ophthalmic solution  Commonly known as:  LIQUIFILM TEARS  Place 1 drop into both eyes as needed (for dry eyes).     potassium chloride 10 MEQ CR capsule  Commonly known as:  MICRO-K  TAKE 1 CAPSULE BY MOUTH TWICE DAILY     promethazine 12.5 MG tablet  Commonly known as:  PHENERGAN  TAKE 1 TABLET BY MOUTH AS NEEDED FOR SEVERE NAUSEA AS DIRECTED     SPIRIVA HANDIHALER 18 MCG inhalation capsule  Generic drug:  tiotropium  INHALE THE CONTENTS OF 1 CAPSULE VIA HANDIHALER BY MOUTH DAILY FOR BREATHING     Tdap 5-2.5-18.5 LF-MCG/0.5 injection  Commonly known as:  BOOSTRIX  Inject 0.5 mLs into the muscle once.      triamterene-hydrochlorothiazide 37.5-25 MG tablet  Commonly known as:  MAXZIDE-25  GIVE "[place]" 1 TABLET BY MOUTH DAILY FOR BLOOD PRESSURE     venlafaxine XR 37.5 MG 24 hr capsule  Commonly known as:  EFFEXOR-XR  TAKE 1 CAPSULE(37.5 MG) BY MOUTH DAILY WITH BREAKFAST     VICTOZA 18 MG/3ML Sopn  Generic drug:  Liraglutide  ADMINISTER 1.8 MG UNDER THE SKIN DAILY FOR BLOOD SUGAR     Vitamin D (Ergocalciferol) 50000 units Caps capsule  Commonly known as:  DRISDOL  Take 1 capsule by mouth 2 (two) times a week.     XELJANZ 5 MG Tabs  Generic drug:  Tofacitinib Citrate  Take 5 mg by mouth daily. For Rheumatoid Arthritis        Review of Systems:  Review of Systems  Constitutional: Positive for malaise/fatigue. Negative for fever and chills.       Wt down 1 lb  HENT: Negative for congestion and hearing loss.   Respiratory: Positive for cough. Negative for shortness of breath and wheezing.   Cardiovascular: Negative for chest pain and leg swelling.  Gastrointestinal: Positive for constipation. Negative for abdominal pain, blood in stool and melena.  Genitourinary: Negative for dysuria.  Musculoskeletal: Positive for myalgias, back pain and joint pain. Negative for falls.  Skin: Negative for itching and rash.  Neurological: Negative for headaches.  Endo/Heme/Allergies:       Diabetes control was poor when not using pen correctly  Psychiatric/Behavioral: The patient has insomnia.     Health Maintenance  Topic Date Due  . OPHTHALMOLOGY EXAM  12/20/1950  . TETANUS/TDAP  12/20/1959  . PNA vac Low Risk Adult (1 of 2 - PCV13) 12/19/2005  . ZOSTAVAX  03/04/2016 (Originally 12/19/2000)  . INFLUENZA VACCINE  05/16/2016 (Originally 03/22/2016)  . HEMOGLOBIN A1C  03/24/2016  . FOOT EXAM  09/27/2016  . MAMMOGRAM  06/19/2017  . COLONOSCOPY  02/10/2025  . DEXA SCAN  09/20/2025    Physical Exam: Filed Vitals:   11/27/15 1033  BP: 144/62  Pulse: 81  Temp: 98.1 F (36.7 C)  TempSrc: Oral    Height: 5\' 7"  (1.702 m)  Weight: 270 lb (122.471 kg)   Body mass index is 42.28 kg/(m^2). Physical Exam  Constitutional: She is oriented to person, place, and time. She appears well-nourished. No distress.  Cardiovascular: Normal rate, regular rhythm, normal heart sounds and intact distal pulses.   Pulmonary/Chest: Effort normal and breath sounds normal. No respiratory distress. She has no wheezes.  Abdominal: Soft. Bowel sounds are normal. She exhibits no distension and no mass. There is no tenderness.  Musculoskeletal: Normal range of motion.  Neurological: She is alert and oriented to person, place, and time.  Skin: Skin is warm and dry.  Psychiatric: She has a normal mood and affect.    Labs reviewed: Basic Metabolic Panel:  Recent Labs  06/25/15 0839 07/28/15 1041 09/25/15 0858 09/25/15 0859  NA 138 140  --  141  K 4.4 4.2  --  3.9  CL 95* 98  --  95*  CO2 23 24  --  26  GLUCOSE 211* 116*  --  54*  BUN 22 20  --  20  CREATININE 0.92 0.99  --  1.05*  CALCIUM 9.3 9.6  --  9.7  TSH  --  0.344* 0.558  --    Liver Function Tests:  Recent Labs  02/18/15 06/25/15 0839 09/25/15 0859  AST 55* 75* 62*  ALT 33 45* 43*  ALKPHOS 128* 132* 124*  BILITOT  --  0.4 0.3  PROT  --  6.7 7.2  ALBUMIN  --  3.9 4.2   No results for input(s): LIPASE, AMYLASE in the last 8760 hours. No results for input(s): AMMONIA in the last 8760 hours. CBC:  Recent Labs  02/18/15 07/28/15 1041  WBC 12.0 15.2*  NEUTROABS  --  10.5*  HGB 13.9  --   HCT 43 42.8  MCV  --  83  PLT 268 338   Lipid Panel:  Recent Labs  03/16/15 0832  CHOL 148  HDL 38*  LDLCALC 89  TRIG 104  CHOLHDL 3.9   Lab Results  Component Value Date   HGBA1C 8.7* 09/25/2015   Assessment/Plan 1. Chronic fatigue fibromyalgia syndrome - I have been encouraging her for several years to increase exercise to help with this, is limited by her RA also -see rheumatology also - oxyCODONE (OXY IR/ROXICODONE) 5 MG  immediate release tablet; Take one tablet every 6 hours as needed for severe pain, Take 2 tablets at bedtime.  Dispense: 180 tablet; Refill: 0  2. Chronic low back pain - uses hydrocodone when she is out and about and on "good days" and oxycodone on bad days - HYDROcodone-acetaminophen (NORCO) 10-325 MG tablet; Take one tablet every three hours as needed for pain  Dispense: 240 tablet; Refill: 0 -  oxyCODONE (OXY IR/ROXICODONE) 5 MG immediate release tablet; Take one tablet every 6 hours as needed for severe pain, Take 2 tablets at bedtime.  Dispense: 180 tablet; Refill: 0  3. Type 2 diabetes mellitus with diabetic polyneuropathy, with long-term current use of insulin ([place]) -has been poorly controlled -notes feeling better since she is using her pen properly -cont same meds as per Cathey  4. Senile osteoporosis - cont ca with D, additional D and twice weekly D which comes from rheum  - Calcium Carbonate-Vitamin D (CALTRATE 600+D) 600-400 MG-UNIT tablet; Take 1 tablet by mouth daily.  Dispense: 30 tablet; Refill: 3 -discussed need for prolia or reclast for this, but advised to ask rheum about it b/c of her xeljanz  5. Essential hypertension -bp elevated  -if remains over 140 next visit, would adjust medications  Labs/tests ordered:  No orders of the defined types were placed in this encounter.    Next appt:  12/28/2015 with [place] for DMII; 3 mos med mgt with me   Mikale Silversmith L. Kumari Sculley, D.O. [facility] [address], [place] [address] Cell Phone (Mon-Fri 8am-5pm):  [phone] On Call:  [phone] & follow prompts after 5pm & weekends Office Phone:  [phone] Office Fax:  [phone]

## 2015-12-14 ENCOUNTER — Other Ambulatory Visit: Payer: Self-pay | Admitting: Nurse Practitioner

## 2015-12-16 ENCOUNTER — Other Ambulatory Visit: Payer: Self-pay | Admitting: *Deleted

## 2015-12-16 MED ORDER — PEN NEEDLES 31G X 5 MM MISC: 1.0000 | Status: DC

## 2015-12-16 NOTE — Telephone Encounter (Signed)
Patient requested to be faxed to pharmacy 

## 2015-12-23 ENCOUNTER — Other Ambulatory Visit: Payer: Self-pay | Admitting: Internal Medicine

## 2015-12-23 DIAGNOSIS — M545 Low back pain: Secondary | ICD-10-CM | POA: Diagnosis not present

## 2015-12-23 DIAGNOSIS — M797 Fibromyalgia: Secondary | ICD-10-CM | POA: Diagnosis not present

## 2015-12-23 DIAGNOSIS — M0579 Rheumatoid arthritis with rheumatoid factor of multiple sites without organ or systems involvement: Secondary | ICD-10-CM | POA: Diagnosis not present

## 2015-12-23 DIAGNOSIS — Z09 Encounter for follow-up examination after completed treatment for conditions other than malignant neoplasm: Secondary | ICD-10-CM | POA: Diagnosis not present

## 2015-12-25 ENCOUNTER — Other Ambulatory Visit: Payer: Self-pay | Admitting: *Deleted

## 2015-12-25 DIAGNOSIS — M545 Low back pain, unspecified: Secondary | ICD-10-CM

## 2015-12-25 DIAGNOSIS — R5382 Chronic fatigue, unspecified: Principal | ICD-10-CM

## 2015-12-25 DIAGNOSIS — G8929 Other chronic pain: Secondary | ICD-10-CM

## 2015-12-25 DIAGNOSIS — G9332 Myalgic encephalomyelitis/chronic fatigue syndrome: Secondary | ICD-10-CM

## 2015-12-25 DIAGNOSIS — M797 Fibromyalgia: Secondary | ICD-10-CM

## 2015-12-25 MED ORDER — OXYCODONE HCL 5 MG PO TABS: ORAL_TABLET | ORAL | Status: DC

## 2015-12-25 MED ORDER — HYDROCODONE-ACETAMINOPHEN 10-325 MG PO TABS: ORAL_TABLET | ORAL | Status: DC

## 2015-12-25 NOTE — Telephone Encounter (Signed)
Patient requested and will pick up 

## 2015-12-28 ENCOUNTER — Encounter: Payer: Self-pay | Admitting: Pharmacotherapy

## 2015-12-28 ENCOUNTER — Ambulatory Visit (INDEPENDENT_AMBULATORY_CARE_PROVIDER_SITE_OTHER): Payer: Medicare Other | Admitting: Pharmacotherapy

## 2015-12-28 VITALS — BP 144/58 | HR 80 | Temp 98.3°F | Ht 67.0 in | Wt 277.0 lb

## 2015-12-28 DIAGNOSIS — I10 Essential (primary) hypertension: Secondary | ICD-10-CM

## 2015-12-28 DIAGNOSIS — E1142 Type 2 diabetes mellitus with diabetic polyneuropathy: Secondary | ICD-10-CM

## 2015-12-28 DIAGNOSIS — Z794 Long term (current) use of insulin: Secondary | ICD-10-CM

## 2015-12-28 MED ORDER — FUROSEMIDE 20 MG PO TABS: 20.0000 mg | ORAL_TABLET | Freq: Two times a day (BID) | ORAL | Status: DC | PRN

## 2015-12-28 NOTE — Addendum Note (Signed)
Addended by: Leonie Green on: 12/28/2015 10:17 AM   Modules accepted: Orders

## 2015-12-28 NOTE — Progress Notes (Signed)
  Subjective:    Katherine Delgado is a 75 y.o.white female who presents for follow-up of Type 2 diabetes mellitus.   Last A1C was 8.7% - did not get labs prior to this OV. Average BG: 117mg /dl Complaining of frequent hypoglycemia.  Using Toujeo 100 units daily She has usually been taking Humalog 20 units with meals, but decreases if not eating as much.  Trying to make healthy food choices.  Has been cutting out starchy carbs. No routine exercise.  Limited due to joint pain. Wears glasses.  Has peripheral edema. Has peripheral neuropathy. Nocturia twice per night. Having difficulty sleeping.  Uses melatonin.  Review of Systems A comprehensive review of systems was negative except for: Constitutional: positive for not sleeping well Eyes: positive for contacts/glasses Cardiovascular: positive for lower extremity edema Genitourinary: positive for nocturia Musculoskeletal: positive for arthralgias and stiff joints Neurological: positive for peripheral neuropathy    Objective:    BP 144/58 mmHg  Pulse 80  Temp(Src) 98.3 F (36.8 C) (Oral)  Ht 5\' 7"  (1.702 m)  Wt 277 lb (125.646 kg)  BMI 43.37 kg/m2  SpO2 97%  General:  alert, cooperative, no distress and morbidly obese  Oropharynx: normal findings: lips normal without lesions and gums healthy   Eyes:  negative findings: lids and lashes normal and conjunctivae and sclerae normal   Ears:  external ears normal        Lung: clear to auscultation bilaterally  Heart:  regular rate and rhythm     Extremities: edema bilateral lower extremities  Skin: dry     Neuro: mental status, speech normal, alert and oriented x3, reflexes normal and symmetric and gait and station normal   Lab Review GLUCOSE (mg/dL)  Date Value  09/25/2015 54*  07/28/2015 116*  06/25/2015 211*   GLUCOSE, BLD (mg/dL)  Date Value  11/03/2014 148*  10/20/2011 180*   CO2 (mmol/L)  Date Value  09/25/2015 26  07/28/2015 24  06/25/2015 23   BUN (mg/dL)   Date Value  09/25/2015 20  07/28/2015 20  06/25/2015 22  11/03/2014 15  10/20/2011 28*   CREATININE (mg/dL)  Date Value  02/18/2015 0.9   CREATININE, SER (mg/dL)  Date Value  09/25/2015 1.05*  07/28/2015 0.99  06/25/2015 0.92   A1C, CMP today    Assessment:    Diabetes Mellitus type II, under good control.  Average BG is excellent; however, having too many episodes of hypoglycemia BP slightly above target <140/90 today   Plan:    1.  Rx changes: Decrease Toujeo to 90 units daily. 2.  Continue Humalog 20 units with each meal. 3.  Counseled on hypoglycemia. 4.  Counseled on nutrition goals. 5.  Counseled on need for routine exercise.  Goal is 30-45 minutes 5 x week. 6.  BP slightly above target <140/90.  Will continue amlodipine, lisinopril, metoprolol, triamterene-hctz and monitor.

## 2015-12-28 NOTE — Patient Instructions (Signed)
Decrease Toujeo to 90 units daily

## 2015-12-29 LAB — COMPREHENSIVE METABOLIC PANEL
ALT: 40 IU/L — ABNORMAL HIGH (ref 0–32)
AST: 59 IU/L — ABNORMAL HIGH (ref 0–40)
Albumin/Globulin Ratio: 1.5 (ref 1.2–2.2)
Albumin: 4 g/dL (ref 3.5–4.8)
Alkaline Phosphatase: 113 IU/L (ref 39–117)
BUN/Creatinine Ratio: 23 (ref 12–28)
BUN: 24 mg/dL (ref 8–27)
Bilirubin Total: 0.3 mg/dL (ref 0.0–1.2)
CO2: 27 mmol/L (ref 18–29)
Calcium: 9.5 mg/dL (ref 8.7–10.3)
Chloride: 96 mmol/L (ref 96–106)
Creatinine, Ser: 1.05 mg/dL — ABNORMAL HIGH (ref 0.57–1.00)
GFR calc Af Amer: 60 mL/min/{1.73_m2} (ref >59)
GFR calc non Af Amer: 52 mL/min/{1.73_m2} — ABNORMAL LOW (ref >59)
Globulin, Total: 2.6 g/dL (ref 1.5–4.5)
Glucose: 113 mg/dL — ABNORMAL HIGH (ref 65–99)
Potassium: 4.1 mmol/L (ref 3.5–5.2)
Sodium: 141 mmol/L (ref 134–144)
Total Protein: 6.6 g/dL (ref 6.0–8.5)

## 2015-12-29 LAB — MICROALBUMIN / CREATININE URINE RATIO
Creatinine, Urine: 83.1 mg/dL
MICROALB/CREAT RATIO: 4.6 mg/g creat (ref 0.0–30.0)
Microalbumin, Urine: 3.8 ug/mL

## 2015-12-29 LAB — HEMOGLOBIN A1C
Est. average glucose Bld gHb Est-mCnc: 163 mg/dL
Hgb A1c MFr Bld: 7.3 % — ABNORMAL HIGH (ref 4.8–5.6)

## 2015-12-30 ENCOUNTER — Other Ambulatory Visit: Payer: Self-pay | Admitting: Internal Medicine

## 2016-01-04 ENCOUNTER — Ambulatory Visit: Payer: Medicare Other | Admitting: Internal Medicine

## 2016-01-07 ENCOUNTER — Ambulatory Visit: Payer: Medicare Other | Admitting: Podiatry

## 2016-01-08 ENCOUNTER — Emergency Department (HOSPITAL_COMMUNITY): Payer: Medicare Other

## 2016-01-08 ENCOUNTER — Encounter (HOSPITAL_COMMUNITY): Payer: Self-pay

## 2016-01-08 ENCOUNTER — Emergency Department (HOSPITAL_COMMUNITY)
Admission: EM | Admit: 2016-01-08 | Discharge: 2016-01-08 | Disposition: A | Discharge status: Home / Self Care | Payer: Medicare Other | Attending: Emergency Medicine | Admitting: Emergency Medicine

## 2016-01-08 DIAGNOSIS — M069 Rheumatoid arthritis, unspecified: Secondary | ICD-10-CM | POA: Diagnosis not present

## 2016-01-08 DIAGNOSIS — Z87891 Personal history of nicotine dependence: Secondary | ICD-10-CM | POA: Diagnosis not present

## 2016-01-08 DIAGNOSIS — Z7982 Long term (current) use of aspirin: Secondary | ICD-10-CM | POA: Insufficient documentation

## 2016-01-08 DIAGNOSIS — M1611 Unilateral primary osteoarthritis, right hip: Secondary | ICD-10-CM | POA: Insufficient documentation

## 2016-01-08 DIAGNOSIS — J449 Chronic obstructive pulmonary disease, unspecified: Secondary | ICD-10-CM | POA: Insufficient documentation

## 2016-01-08 DIAGNOSIS — M25551 Pain in right hip: Secondary | ICD-10-CM | POA: Diagnosis not present

## 2016-01-08 DIAGNOSIS — M542 Cervicalgia: Secondary | ICD-10-CM | POA: Diagnosis not present

## 2016-01-08 DIAGNOSIS — E782 Mixed hyperlipidemia: Secondary | ICD-10-CM | POA: Diagnosis not present

## 2016-01-08 DIAGNOSIS — E1159 Type 2 diabetes mellitus with other circulatory complications: Secondary | ICD-10-CM | POA: Diagnosis not present

## 2016-01-08 DIAGNOSIS — Z794 Long term (current) use of insulin: Secondary | ICD-10-CM | POA: Insufficient documentation

## 2016-01-08 DIAGNOSIS — E039 Hypothyroidism, unspecified: Secondary | ICD-10-CM | POA: Insufficient documentation

## 2016-01-08 DIAGNOSIS — J351 Hypertrophy of tonsils: Secondary | ICD-10-CM | POA: Diagnosis not present

## 2016-01-08 DIAGNOSIS — R52 Pain, unspecified: Secondary | ICD-10-CM

## 2016-01-08 DIAGNOSIS — I10 Essential (primary) hypertension: Secondary | ICD-10-CM | POA: Insufficient documentation

## 2016-01-08 DIAGNOSIS — Z79899 Other long term (current) drug therapy: Secondary | ICD-10-CM | POA: Diagnosis not present

## 2016-01-08 DIAGNOSIS — R1310 Dysphagia, unspecified: Secondary | ICD-10-CM | POA: Diagnosis not present

## 2016-01-08 DIAGNOSIS — F329 Major depressive disorder, single episode, unspecified: Secondary | ICD-10-CM | POA: Insufficient documentation

## 2016-01-08 DIAGNOSIS — M1610 Unilateral primary osteoarthritis, unspecified hip: Secondary | ICD-10-CM

## 2016-01-08 LAB — URINALYSIS, ROUTINE W REFLEX MICROSCOPIC
Bilirubin Urine: NEGATIVE
GLUCOSE, UA: NEGATIVE mg/dL
Hgb urine dipstick: NEGATIVE
Ketones, ur: NEGATIVE mg/dL
LEUKOCYTES UA: NEGATIVE
NITRITE: NEGATIVE
PH: 6.5 (ref 5.0–8.0)
Protein, ur: NEGATIVE mg/dL

## 2016-01-08 LAB — COMPREHENSIVE METABOLIC PANEL
ALK PHOS: 98 U/L (ref 38–126)
ALT: 29 U/L (ref 14–54)
AST: 35 U/L (ref 15–41)
Albumin: 3.8 g/dL (ref 3.5–5.0)
Anion gap: 9 (ref 5–15)
BUN: 23 mg/dL — AB (ref 6–20)
CHLORIDE: 101 mmol/L (ref 101–111)
CO2: 29 mmol/L (ref 22–32)
CREATININE: 0.88 mg/dL (ref 0.44–1.00)
Calcium: 8.9 mg/dL (ref 8.9–10.3)
GFR calc Af Amer: >60 mL/min (ref >60)
GLUCOSE: 138 mg/dL — AB (ref 65–99)
POTASSIUM: 3.5 mmol/L (ref 3.5–5.1)
SODIUM: 139 mmol/L (ref 135–145)
TOTAL PROTEIN: 7.1 g/dL (ref 6.5–8.1)
Total Bilirubin: 0.8 mg/dL (ref 0.3–1.2)

## 2016-01-08 LAB — CBC WITH DIFFERENTIAL/PLATELET
Basophils Absolute: 0.1 10*3/uL (ref 0.0–0.1)
Basophils Relative: 1 %
EOS ABS: 0.3 10*3/uL (ref 0.0–0.7)
EOS PCT: 2 %
HCT: 43.2 % (ref 36.0–46.0)
HEMOGLOBIN: 13.8 g/dL (ref 12.0–15.0)
LYMPHS ABS: 2.2 10*3/uL (ref 0.7–4.0)
Lymphocytes Relative: 14 %
MCH: 27.2 pg (ref 26.0–34.0)
MCHC: 31.9 g/dL (ref 30.0–36.0)
MCV: 85.2 fL (ref 78.0–100.0)
MONO ABS: 1.6 10*3/uL — AB (ref 0.1–1.0)
MONOS PCT: 10 %
Neutro Abs: 12 10*3/uL — ABNORMAL HIGH (ref 1.7–7.7)
Neutrophils Relative %: 73 %
PLATELETS: 286 10*3/uL (ref 150–400)
RBC: 5.07 MIL/uL (ref 3.87–5.11)
RDW: 15 % (ref 11.5–15.5)
WBC: 16.2 10*3/uL — ABNORMAL HIGH (ref 4.0–10.5)

## 2016-01-08 LAB — I-STAT TROPONIN, ED: TROPONIN I, POC: 0.01 ng/mL (ref 0.00–0.08)

## 2016-01-08 MED ORDER — CLINDAMYCIN HCL 300 MG PO CAPS
300.0000 mg | ORAL_CAPSULE | Freq: Once | ORAL | Status: AC
  Administered 2016-01-08: 300 mg via ORAL
  Filled 2016-01-08: qty 1

## 2016-01-08 MED ORDER — IOPAMIDOL (ISOVUE-300) INJECTION 61%
75.0000 mL | Freq: Once | INTRAVENOUS | Status: AC | PRN
  Administered 2016-01-08: 75 mL via INTRAVENOUS

## 2016-01-08 MED ORDER — CLINDAMYCIN HCL 300 MG PO CAPS: 300.0000 mg | ORAL_CAPSULE | Freq: Three times a day (TID) | ORAL | Status: DC

## 2016-01-08 MED ORDER — MORPHINE SULFATE (PF) 4 MG/ML IV SOLN
4.0000 mg | Freq: Once | INTRAVENOUS | Status: AC
  Administered 2016-01-08: 4 mg via INTRAVENOUS
  Filled 2016-01-08: qty 1

## 2016-01-08 MED ORDER — DEXAMETHASONE SODIUM PHOSPHATE 10 MG/ML IJ SOLN
4.0000 mg | Freq: Once | INTRAMUSCULAR | Status: AC
  Administered 2016-01-08: 4 mg via INTRAVENOUS
  Filled 2016-01-08: qty 1

## 2016-01-08 NOTE — ED Provider Notes (Signed)
CSN: [id number]     Arrival date & time 01/08/16  1439 History   First MD Initiated Contact with Patient 01/08/16 1623     Chief Complaint  Patient presents with  . Neck Pain     (Consider location/radiation/quality/duration/timing/severity/associated sxs/prior Treatment) The history is provided by the patient.  Katherine Delgado is a 75 y.o. female hx of COPD, rheumatoid arthritis, HTN, fibromyalgia, DM, Guillain barre here with hip pain, neck swelling. Patient states that she has right hip pain for the last week and a half.  She states that It is worse when she moves around and is painful to bear weight on it but she did not fall or injure herself.  She also noticed pain radiating down the lateral aspect of her leg but does not go past her knee.  She denies any weakness associated with that and states that this is not similar to her previous Guillain Barre. For the last several days, she noticed progressive left neck swelling and pain. It is worse with movements as well and has pain when she swallows but denies sore throat.    Past Medical History  Diagnosis Date  . COPD (chronic obstructive pulmonary disease) ([place])   . Hypertension   . RA (rheumatoid arthritis) ([place])   . Thyroid disease   . Miscarriage 1962  . History of benign thymus tumor   . Back injury   . GERD (gastroesophageal reflux disease)   . Arthritis   . Fibromyalgia   . Diabetes mellitus without complication ([place])   . Thyroid disorder   . Acute infective polyneuritis ([place]) 2002  . Guillain-Barre syndrome ([place])   . Polyneuropathy in diabetes(357.2)   . Morbid obesity ([place])   . Spondylosis, lumbosacral   . Degenerative arthritis   . Dyslipidemia   . Spinal stenosis, lumbar region, without neurogenic claudication   . Candidiasis of skin and nails   . Acute maxillary sinusitis   . Insomnia, unspecified   . Candidiasis of vulva and vagina   . Edema   . Acute sinusitis, unspecified   . Obstructive chronic bronchitis  with acute bronchitis ([place])   . Urinary tract infection, site not specified   . Unspecified hypothyroidism   . Type II or unspecified type diabetes mellitus with peripheral circulatory disorders, uncontrolled(250.72)   . Mixed hyperlipidemia   . Other specified disease of white blood cells   . Depressive disorder, not elsewhere classified   . Chronic pain syndrome   . Tear film insufficiency, unspecified   . Unspecified essential hypertension   . Allergic rhinitis due to pollen   . Unspecified chronic bronchitis ([place])   . Reflux esophagitis   . Diaphragmatic hernia without mention of obstruction or gangrene   . Unspecified pruritic disorder   . Rheumatoid arthritis(714.0)   . Pain in joint, multiple sites   . Stiffness of joints, not elsewhere classified, multiple sites   . Lumbago   . Other malaise and fatigue   . Spontaneous ecchymoses   . Abnormal weight gain   . Shortness of breath   . Cough   . Unspecified urinary incontinence   . Osteopenia, senile   . Osteoporosis    Past Surgical History  Procedure Laterality Date  . Abdominal hysterectomy  1974  . Tonsillectomy    . Cholecystectomy  1984  . Back surgery  1982  . Ovarian cyst surgery  1968  . Thymus tumor    . Knee surgery Bilateral 08/27/2010 (L) and 01/11/2011 (R)  .  [place]  . Bronchoscopy  2001  . Abdominal tumor  2002  . Thymus tumor  10/2000  . Appendectomy     Family History  Problem Relation Age of Onset  . Alzheimer's disease Mother   . Heart disease Mother   . Heart disease Father   . Liver disease Father   . Breast cancer Sister   . Colon cancer Neg Hx   . Esophageal cancer Neg Hx   . Kidney disease Neg Hx   . Stomach cancer Neg Hx   . Rectal cancer Neg Hx   . Colon polyps Brother    Social History  Substance Use Topics  . Smoking status: Former Smoker    Types: Cigarettes    Quit date: 12/07/1979  . Smokeless tobacco: Never Used  . Alcohol Use: No   OB History    No data  available     Review of Systems  Musculoskeletal: Positive for neck pain.       R hip pain   All other systems reviewed and are negative.     Allergies  Penicillins and Sulfamethoxazole-trimethoprim  Home Medications   Prior to Admission medications   Medication Sig Start Date End Date Taking? Authorizing Provider  amLODipine (NORVASC) 10 MG tablet TAKE 1 TABLET(10 MG) BY MOUTH DAILY 12/15/15  Yes Tiffany L Reed, DO  aspirin EC 81 MG tablet Take 81 mg by mouth daily as needed.    Yes Historical Provider, MD  Cholecalciferol (VITAMIN D) 2000 units tablet Take 2,000 Units by mouth daily.   Yes Historical Provider, MD  Cholecalciferol 2000 UNITS TABS Take 2,000 Units by mouth daily.   Yes Historical Provider, MD  furosemide (LASIX) 20 MG tablet Take 1 tablet (20 mg total) by mouth 2 (two) times daily as needed. Use as needed for 3 pounds gain in one day or 5 pounds in one week Patient taking differently: Take 20 mg by mouth 2 (two) times daily as needed for fluid. Use as needed for 3 pounds gain in one day or 5 pounds in one week 12/28/15  Yes Tiffany L Reed, DO  gabapentin (NEURONTIN) 600 MG tablet TAKE 1 TABLET BY MOUTH THREE TIMES DAILY 12/23/15  Yes Tiffany Lynelle Doctor, DO  HYDROcodone-acetaminophen [facility]) 10-325 MG tablet Take one tablet every three hours as needed for pain 12/25/15  Yes Tiffany L Reed, DO  Insulin Glargine (TOUJEO SOLOSTAR) 300 UNIT/ML SOPN Inject 90 Units into the skin at bedtime.    Yes Historical Provider, MD  Insulin Lispro (HUMALOG KWIKPEN) 200 UNIT/ML SOPN Inject 20 Units into the skin 3 (three) times daily before meals. 06/29/15  Yes Tivis Ringer, RPH-CPP  levothyroxine (SYNTHROID, LEVOTHROID) 150 MCG tablet Take one tablet 30 minutes before breakfast 07/31/15  Yes Monica Carter, DO  lidocaine (LIDODERM) 5 % Apply patch to bilateral knees at bedtime.  Remove & Discard patch within 12 hours or as directed by MD 02/20/15  Yes Tiffany L Reed, DO  lisinopril (PRINIVIL,ZESTRIL)  10 MG tablet Take 1 tablet (10 mg total) by mouth 2 (two) times daily. 03/23/15  Yes Tivis Ringer, RPH-CPP  loperamide (IMODIUM A-D) 2 MG tablet Take 2 mg by mouth 4 (four) times daily as needed for diarrhea or loose stools.   Yes Historical Provider, MD  Melatonin 10 MG CAPS Take 1 capsule by mouth at bedtime   Yes Historical Provider, MD  metoprolol tartrate (LOPRESSOR) 25 MG tablet TAKE 1 TABLET(25 MG) BY MOUTH TWICE DAILY 10/27/15  Yes  Tiffany L Reed, DO  nystatin (MYCOSTATIN/NYSTOP) 100000 UNIT/GM POWD APPLY 1 GRAM TOPICALLY TO THE AFFECTED AREA DAILY AS NEEDED FOR ITCHING 11/24/15  Yes Tiffany L Reed, DO  nystatin-triamcinolone (MYCOLOG II) cream Apply 1 application topically 4 (four) times daily as needed (for rash). Apply under abdomen folds twice daily until rash healed 12/23/14  Yes Tiffany L Reed, DO  olopatadine (PATANOL) 0.1 % ophthalmic solution INSTILL 1 DROP IN RIGHT EYE TWICE DAILY FOR 1 WEEK 11/24/15  Yes Lauree Chandler, NP  omega-3 acid ethyl esters (LOVAZA) 1 G capsule TAKE ONE CAPSULE BY MOUTH EVERY DAY 07/24/15  Yes Tiffany L Reed, DO  oxyCODONE (OXY IR/ROXICODONE) 5 MG immediate release tablet Take one tablet every 6 hours as needed for severe pain, Take 2 tablets at bedtime. 12/25/15  Yes Tiffany L Reed, DO  pantoprazole (PROTONIX) 40 MG tablet Take 1 tablet (40 mg total) by mouth 2 (two) times daily. 01/01/15  Yes Lafayette Dragon, MD  pilocarpine (SALAGEN) 5 MG tablet Take 5 mg by mouth daily. For dry mouth 09/24/13  Yes Historical Provider, MD  polyvinyl alcohol (LIQUIFILM TEARS) 1.4 % ophthalmic solution Place 1 drop into both eyes as needed (for dry eyes).    Yes Historical Provider, MD  potassium chloride (MICRO-K) 10 MEQ CR capsule TAKE 1 CAPSULE BY MOUTH TWICE DAILY Patient taking differently: TAKE 1 CAPSULE BY MOUTH TWICE DAILY AS NEEDED FOR SWELLING 07/24/15  Yes Tiffany L Reed, DO  promethazine (PHENERGAN) 12.5 MG tablet TAKE 1 TABLET BY MOUTH AS NEEDED FOR SEVERE NAUSEA AS DIRECTED  07/06/15  Yes Tiffany L Reed, DO  SPIRIVA HANDIHALER 18 MCG inhalation capsule INHALE THE CONTENTS OF 1 CAPSULE VIA HANDIHALER BY MOUTH DAILY FOR BREATHING 11/24/15  Yes Tiffany L Reed, DO  triamterene-hydrochlorothiazide (MAXZIDE-25) 37.5-25 MG tablet GIVE "[place]" 1 TABLET BY MOUTH DAILY FOR BLOOD PRESSURE 11/24/15  Yes Tiffany L Reed, DO  venlafaxine XR (EFFEXOR-XR) 37.5 MG 24 hr capsule TAKE 1 CAPSULE(37.5 MG) BY MOUTH DAILY WITH BREAKFAST 12/31/15  Yes Tiffany L Reed, DO  VICTOZA 18 MG/3ML SOPN ADMINISTER 1.8 MG UNDER THE SKIN DAILY FOR BLOOD SUGAR 11/24/15  Yes Tiffany L Reed, DO  XELJANZ 5 MG TABS Take 5 mg by mouth 2 (two) times daily. For Rheumatoid Arthritis 03/28/13  Yes Historical Provider, MD  DM-Phenylephrine-Acetaminophen 10-5-325 MG CAPS Take 1 capsule by mouth daily as needed (for cold).    Historical Provider, MD  folic acid (FOLVITE) 1 MG tablet TK 1 T PO QD 12/23/15   Historical Provider, MD  glucose blood (ACCU-CHEK SMARTVIEW) test strip Use as instructed Patient taking differently: Check blood sugar 3 x daily 08/04/14   Cathey Miller, RPH-CPP  Insulin Pen Needle (PEN NEEDLES) 31G X 5 MM MISC 1 each by Does not apply route as directed. 12/16/15   Tiffany L Reed, DO  Insulin Syringe-Needle U-100 31G X 5/16" 1 ML MISC Use once daily with administration of Insulin DX E11.59 08/04/14   Cathey Miller, RPH-CPP  Methotrexate Sodium (METHOTREXATE, PF,) 50 MG/2ML injection INJ 0.6 ML [place] ONCE Q WEEK 12/23/15   Historical Provider, MD   BP 109/72 mmHg  Pulse 101  Temp(Src) 98.2 F (36.8 C) (Oral)  Resp 17  SpO2 92% Physical Exam  Constitutional: She is oriented to person, place, and time.  Chronically ill, NAD   HENT:  Head: Normocephalic.  Right Ear: External ear normal.  Left Ear: External ear normal.  Mouth/Throat: Oropharynx is clear and moist.  Eyes: Conjunctivae  are normal. Pupils are equal, round, and reactive to light.  Neck:  L lower neck lipoma vs SCM tenderness or swelling, nl ROM.  No cervical spine tenderness, no meningeal signs   Cardiovascular: Normal rate, regular rhythm and normal heart sounds.   Pulmonary/Chest: Effort normal and breath sounds normal. No respiratory distress. She has no wheezes. She has no rales.  Abdominal: Soft. Bowel sounds are normal. She exhibits no distension. There is no tenderness.  Musculoskeletal:  R paralumbar tenderness. Dec ROM R hip due to pain but no obvious deformity. Nl strength throughout, + straight leg raise R side. Nl reflexes and sensation throughout   Neurological: She is alert and oriented to person, place, and time.  Skin: Skin is warm and dry.  Psychiatric: She has a normal mood and affect. Her behavior is normal. Judgment and thought content normal.  Nursing note and vitals reviewed.   ED Course  Procedures (including critical care time) Labs Review Labs Reviewed  CBC WITH DIFFERENTIAL/PLATELET - Abnormal; Notable for the following:    WBC 16.2 (*)    Neutro Abs 12.0 (*)    Monocytes Absolute 1.6 (*)    All other components within normal limits  COMPREHENSIVE METABOLIC PANEL - Abnormal; Notable for the following:    Glucose, Bld 138 (*)    BUN 23 (*)    All other components within normal limits  URINALYSIS, ROUTINE W REFLEX MICROSCOPIC (NOT AT [facility]) - Abnormal; Notable for the following:    Specific Gravity, Urine >1.046 (*)    All other components within normal limits  I-STAT TROPOININ, ED    Imaging Review Dg Chest 2 View  01/08/2016  CLINICAL DATA:  Neck pain and difficulty swallowing. EXAM: CHEST  2 VIEW COMPARISON:  Chest x-ray dated 11/03/2014. FINDINGS: Heart size is normal. Cardiomediastinal silhouette is stable in size and configuration. Atherosclerotic changes again noted at the aortic arch. Median sternotomy wires appear intact and stable in alignment. Lungs are clear. Perhaps mild scarring at the left lung base. No evidence of pneumonia. No pleural effusion or pneumothorax seen. No acute- appearing  osseous abnormality. IMPRESSION: No evidence of acute cardiopulmonary abnormality. No evidence of pneumonia. Electronically Signed   By: Franki Cabot M.D.   On: 01/08/2016 19:51   Dg Lumbar Spine Complete  01/08/2016  CLINICAL DATA:  Chronic right hip pain, worsened in the last 1-1/2 weeks. No known injury. Neck pain for which a CT neck with contrast was performed earlier today. EXAM: LUMBAR SPINE - COMPLETE 4+ VIEW COMPARISON:  Plain film of the lumbar spine dated 10/05/2012. FINDINGS: Alignment is stable. The mild dextroscoliosis is stable. Mild degenerative change again noted within the lumbar spine, with slight disc space narrowings and minimal osseous spurring at multiple levels. The degree of degenerative change appears stable compared to the previous exam. The chronic mild compression deformity of the L1 vertebral body is stable. No evidence of acute fracture. No acute or suspicious osseous lesion. Upper sacrum appears intact and normally aligned. Atherosclerotic changes are again seen along the walls of the infrarenal abdominal aorta. Additional vascular calcifications noted within the upper abdomen. Paravertebral soft tissues otherwise unremarkable. Incidental note is made of contrast within the collecting system from earlier contrast-enhanced CT. IMPRESSION: 1. Mild degenerative change within lumbar spine, stable in degree when compared to the earlier exam of 10/05/2012. 2. No acute findings. Electronically Signed   By: Franki Cabot M.D.   On: 01/08/2016 19:50   Ct Soft Tissue Neck W Contrast  01/08/2016  CLINICAL DATA:  Left-sided neck pain. Pain worse with movement. Pain with swallowing. EXAM: CT NECK WITH CONTRAST TECHNIQUE: Multidetector CT imaging of the neck was performed using the standard protocol following the bolus administration of intravenous contrast. CONTRAST:  66mL ISOVUE-300 IOPAMIDOL (ISOVUE-300) INJECTION 61% COMPARISON:  MRI of the cervical spine 05/23/2013. FINDINGS: Pharynx and  larynx: There is mild prominence below the angle tonsillar tissue without focal mass lesion. No focal mucosal or submucosal lesions are present. Vocal cords are midline. Salivary glands: Submandibular and parotid glands are normal bilaterally. Thyroid: The thyroid is mildly prominent and heterogeneous. A focal calcification is present on the left. No dominant nodule was present to warrant follow-up. Lymph nodes: Benign appearing reactive type level 2 lymph nodes are present bilaterally. No pathologically enlarged or malignant appearing nodes are present. Vascular: Atherosclerotic calcifications are present at the carotid bifurcations bilaterally without definite stenosis. Dense atherosclerotic calcifications are present within the cavernous internal carotid arteries bilaterally cavernous segment stenoses are suspected bilaterally, more likely on the left. Limited intracranial: No acute intracranial abnormality is present. Visualized orbits: Globes and orbits are intact. Mastoids and visualized paranasal sinuses: The paranasal sinuses and mastoid air cells are clear. Skeleton: Mild facet degenerative changes are most prominent at C3-4 on the right and bilaterally at C7-T1. Vertebral body heights alignment are maintained. There straightening of the normal cervical lordosis. No focal lytic or blastic lesions are present. Upper chest: The lung apices are clear bilaterally IMPRESSION: 1. No acute or focal lesion to explain soft tissue neck pain. 2. Mild prominence of the lingual tonsils and bilateral level 2 lymph nodes may represent acute inflammatory process. There is no discrete mass lesion. 3. Multilevel facet degenerative changes could contribute neck pain. Electronically Signed   By: [place] M.D.   On: 01/08/2016 18:49   Dg Hip Unilat With Pelvis 2-3 Views Right  01/08/2016  CLINICAL DATA:  Pain without trauma. EXAM: DG HIP (WITH OR WITHOUT PELVIS) 2-3V RIGHT COMPARISON:  None. FINDINGS: There are  degenerative changes in the right hip with no acute fractures or dislocations identified. IMPRESSION: Degenerative changes in the right hip with no fractures or dislocations. Electronically Signed   By: Dorise Bullion III M.D   On: 01/08/2016 19:47   I have personally reviewed and evaluated these images and lab results as part of my medical decision-making.   EKG Interpretation   Date/Time:  Friday Jan 08 2016 18:44:45 EDT Ventricular Rate:  105 PR Interval:  187 QRS Duration: 100 QT Interval:  351 QTC Calculation: 464 R Axis:   65 Text Interpretation:  Sinus tachycardia Ventricular premature complex No  significant change since last tracing Confirmed by YAO  MD, DAVID ([id number])  on 01/08/2016 7:07:19 PM      MDM   Final diagnoses:  Pain   Katherine Delgado is a 75 y.o. female here with neck pain, R hip and back pain. Neck pain likely lipoma vs mass vs SCM tenderness. Back and hip pain likely sciatica vs arthritis. She had guillain barre before with bilateral leg weakness and numbness and trouble breathing but denies all the above symptoms and I doubt Guillain barre currently. Will get CT neck, labs, R hip and lumbar xray.   8:17 PM CT showed enlarged lingual tonsils. Consider either inflammatory vs early infectious. Airway intact. Afebrile. WBC 16 but that is similar to previous. No meningeal signs and no RPA on CT. xrays showed arthritis. Given decadron for the tonsillar swelling. Will give empiric  course of clindamycin and refer to ENT.     Wandra Arthurs, MD 01/08/16 2019

## 2016-01-08 NOTE — ED Notes (Signed)
Pt returned from radiology.

## 2016-01-08 NOTE — ED Notes (Signed)
Pt here originally wanting to be seen for hip pain.  Chronic.  Worse in last 1 1/2 weeks.  No injury noted.  Pt main complaint today is left neck pain.  Worse with movement.  Painful to swallow.  Started last night.

## 2016-01-08 NOTE — ED Notes (Signed)
Pt currently in radiology dept

## 2016-01-08 NOTE — Discharge Instructions (Signed)
Take clindamycin 300 mg three times daily for a week.   Your swelling should improve in a week.   Continue your pain medicines as prescribed by your doctor.   Use walker at home.   See your doctor and ENT doctor in a week if you still have pain.   Return to ER if you have worse neck pain, neck swelling, trouble breathing, leg pain

## 2016-01-22 ENCOUNTER — Other Ambulatory Visit: Payer: Self-pay | Admitting: *Deleted

## 2016-01-22 DIAGNOSIS — G9332 Myalgic encephalomyelitis/chronic fatigue syndrome: Secondary | ICD-10-CM

## 2016-01-22 DIAGNOSIS — M797 Fibromyalgia: Secondary | ICD-10-CM

## 2016-01-22 DIAGNOSIS — R5382 Chronic fatigue, unspecified: Principal | ICD-10-CM

## 2016-01-22 DIAGNOSIS — M545 Low back pain: Secondary | ICD-10-CM

## 2016-01-22 DIAGNOSIS — G8929 Other chronic pain: Secondary | ICD-10-CM

## 2016-01-22 MED ORDER — HYDROCODONE-ACETAMINOPHEN 10-325 MG PO TABS: ORAL_TABLET | ORAL | Status: DC

## 2016-01-22 MED ORDER — OXYCODONE HCL 5 MG PO TABS: ORAL_TABLET | ORAL | Status: DC

## 2016-01-22 NOTE — Telephone Encounter (Signed)
Patient requested and will pick up 

## 2016-01-27 ENCOUNTER — Other Ambulatory Visit: Payer: Self-pay | Admitting: Internal Medicine

## 2016-01-27 ENCOUNTER — Other Ambulatory Visit: Payer: Self-pay | Admitting: Pharmacotherapy

## 2016-02-02 DIAGNOSIS — Z09 Encounter for follow-up examination after completed treatment for conditions other than malignant neoplasm: Secondary | ICD-10-CM | POA: Diagnosis not present

## 2016-02-02 DIAGNOSIS — M0579 Rheumatoid arthritis with rheumatoid factor of multiple sites without organ or systems involvement: Secondary | ICD-10-CM | POA: Diagnosis not present

## 2016-02-02 DIAGNOSIS — M7071 Other bursitis of hip, right hip: Secondary | ICD-10-CM | POA: Diagnosis not present

## 2016-02-02 DIAGNOSIS — M797 Fibromyalgia: Secondary | ICD-10-CM | POA: Diagnosis not present

## 2016-02-03 ENCOUNTER — Ambulatory Visit (INDEPENDENT_AMBULATORY_CARE_PROVIDER_SITE_OTHER): Payer: Medicare Other | Admitting: Internal Medicine

## 2016-02-03 ENCOUNTER — Encounter: Payer: Self-pay | Admitting: Internal Medicine

## 2016-02-03 VITALS — BP 130/82 | HR 99 | Temp 98.5°F | Resp 14 | Ht 67.0 in | Wt 268.0 lb

## 2016-02-03 DIAGNOSIS — M2540 Effusion, unspecified joint: Secondary | ICD-10-CM | POA: Diagnosis not present

## 2016-02-03 DIAGNOSIS — G9332 Myalgic encephalomyelitis/chronic fatigue syndrome: Secondary | ICD-10-CM

## 2016-02-03 DIAGNOSIS — R600 Localized edema: Secondary | ICD-10-CM

## 2016-02-03 DIAGNOSIS — M069 Rheumatoid arthritis, unspecified: Secondary | ICD-10-CM

## 2016-02-03 DIAGNOSIS — R5382 Chronic fatigue, unspecified: Secondary | ICD-10-CM

## 2016-02-03 DIAGNOSIS — M797 Fibromyalgia: Secondary | ICD-10-CM

## 2016-02-03 DIAGNOSIS — Z9225 Personal history of immunosupression therapy: Secondary | ICD-10-CM | POA: Diagnosis not present

## 2016-02-03 DIAGNOSIS — Z79899 Other long term (current) drug therapy: Secondary | ICD-10-CM | POA: Diagnosis not present

## 2016-02-03 NOTE — Progress Notes (Signed)
Patient ID: Katherine Delgado, female   DOB: Jan 21, 1941, 75 y.o.   MRN: [id number]    Location:  [facility] Place of Service: OFFICE  Chief Complaint  Patient presents with  . Acute Visit    Bilateral leg and foot swelling (left is worse). Left foot is painful     HPI:  75 yo female seen today for swelling in b/l LE x 1 week. She is taking lasix 87m daily with Kdur and occasionally BID which has helped. She has hx RA and FMS. She is taking xeljanz for RA and will be starting MTX when labs done by Rheumatology. She takes OxyIR for FMS/chronic pain syndrome. She has pain in her right hip/neck and was seen in the ED 5/19th for it. She saw Rheum yesterday and dx with hip bursitis --> steroid injection into bursa which has helped. The ED tx neck pain with clindamycin for inflamed lingual tonsil noted on CT neck. Neck pain improved. She has not seen ENT yet  Past Medical History  Diagnosis Date  . COPD (chronic obstructive pulmonary disease) ([place]   . Hypertension   . RA (rheumatoid arthritis) ([place]   . Thyroid disease   . Miscarriage 1962  . History of benign thymus tumor   . Back injury   . GERD (gastroesophageal reflux disease)   . Arthritis   . Fibromyalgia   . Diabetes mellitus without complication ([place]   . Thyroid disorder   . Acute infective polyneuritis ([place] 2002  . Guillain-Barre syndrome ([place]   . Polyneuropathy in diabetes(357.2)   . Morbid obesity ([place]   . Spondylosis, lumbosacral   . Degenerative arthritis   . Dyslipidemia   . Spinal stenosis, lumbar region, without neurogenic claudication   . Candidiasis of skin and nails   . Acute maxillary sinusitis   . Insomnia, unspecified   . Candidiasis of vulva and vagina   . Edema   . Acute sinusitis, unspecified   . Obstructive chronic bronchitis with acute bronchitis ([place]   . Urinary tract infection, site not specified   . Unspecified hypothyroidism   . Type II or unspecified type diabetes mellitus with peripheral circulatory  disorders, uncontrolled(250.72)   . Mixed hyperlipidemia   . Other specified disease of white blood cells   . Depressive disorder, not elsewhere classified   . Chronic pain syndrome   . Tear film insufficiency, unspecified   . Unspecified essential hypertension   . Allergic rhinitis due to pollen   . Unspecified chronic bronchitis ([place]   . Reflux esophagitis   . Diaphragmatic hernia without mention of obstruction or gangrene   . Unspecified pruritic disorder   . Rheumatoid arthritis(714.0)   . Pain in joint, multiple sites   . Stiffness of joints, not elsewhere classified, multiple sites   . Lumbago   . Other malaise and fatigue   . Spontaneous ecchymoses   . Abnormal weight gain   . Shortness of breath   . Cough   . Unspecified urinary incontinence   . Osteopenia, senile   . Osteoporosis     Past Surgical History  Procedure Laterality Date  . Abdominal hysterectomy  1974  . Tonsillectomy    . Cholecystectomy  1984  . Back surgery  1982  . Ovarian cyst surgery  1968  . Thymus tumor    . Knee surgery Bilateral 08/27/2010 (L) and 01/11/2011 (R)  . [place] . Bronchoscopy  2001  . Abdominal tumor  2002  . Thymus tumor  10/2000  . Appendectomy      Patient Care Team: Gayland Curry, DO as PCP - General (Geriatric Medicine) Bo Merino, MD (Rheumatology) Lorne Skeens, MD as Attending Physician (Endocrinology)  Social History   Social History  . Marital Status: Widowed    Spouse Name: N/A  . Number of Children: 2  . Years of Education: N/A   Occupational History  . Retired    Social History Main Topics  . Smoking status: Former Smoker    Types: Cigarettes    Quit date: 12/07/1979  . Smokeless tobacco: Never Used  . Alcohol Use: No  . Drug Use: No  . Sexual Activity: No   Other Topics Concern  . Not on file   Social History Narrative   Walks with cane     reports that she quit smoking about 36 years ago. Her smoking use included  Cigarettes. She has never used smokeless tobacco. She reports that she does not drink alcohol or use illicit drugs.  Family History  Problem Relation Age of Onset  . Alzheimer's disease Mother   . Heart disease Mother   . Heart disease Father   . Liver disease Father   . Breast cancer Sister   . Colon cancer Neg Hx   . Esophageal cancer Neg Hx   . Kidney disease Neg Hx   . Stomach cancer Neg Hx   . Rectal cancer Neg Hx   . Colon polyps Brother    Family Status  Relation Status Death Age  . Mother Deceased 41  . Father Deceased 44  . Sister Deceased   . Sister Alive   . Brother Alive   . Brother Alive   . Son Alive   . Son Alive      Allergies  Allergen Reactions  . Penicillins Hives, Itching, Swelling and Rash  . Sulfamethoxazole-Trimethoprim Itching    Medications: Patient's Medications  New Prescriptions   No medications on file  Previous Medications   AMLODIPINE (NORVASC) 10 MG TABLET    TAKE 1 TABLET(10 MG) BY MOUTH DAILY   ASPIRIN EC 81 MG TABLET    Take 81 mg by mouth daily as needed.    CHOLECALCIFEROL (VITAMIN D) 2000 UNITS TABLET    Take 2,000 Units by mouth daily.   FUROSEMIDE (LASIX) 20 MG TABLET    Take 1 tablet (20 mg total) by mouth 2 (two) times daily as needed. Use as needed for 3 pounds gain in one day or 5 pounds in one week   GABAPENTIN (NEURONTIN) 600 MG TABLET    TAKE 1 TABLET BY MOUTH THREE TIMES DAILY   GLUCOSE BLOOD (ACCU-CHEK SMARTVIEW) TEST STRIP    Check blood sugar 3-4 times daily   HYDROCODONE-ACETAMINOPHEN (NORCO) 10-325 MG TABLET    Take one tablet every three hours as needed for pain   INSULIN GLARGINE (TOUJEO SOLOSTAR) 300 UNIT/ML SOPN    Inject 90 Units into the skin at bedtime.    INSULIN LISPRO (HUMALOG KWIKPEN) 200 UNIT/ML SOPN    Inject 20 Units into the skin 3 (three) times daily before meals.   INSULIN PEN NEEDLE (PEN NEEDLES) 31G X 5 MM MISC    1 each by Does not apply route as directed.   INSULIN SYRINGE-NEEDLE U-100 31G X  5/16" 1 ML MISC    Use once daily with administration of Insulin DX E11.59   LEVOTHYROXINE (SYNTHROID, LEVOTHROID) 150 MCG TABLET    Take one tablet 30 minutes before breakfast  LIDOCAINE (LIDODERM) 5 %    Apply patch to bilateral knees at bedtime.  Remove & Discard patch within 12 hours or as directed by MD   LISINOPRIL (PRINIVIL,ZESTRIL) 10 MG TABLET    TAKE 1 TABLET(10 MG) BY MOUTH TWICE DAILY   LOPERAMIDE (IMODIUM A-D) 2 MG TABLET    Take 2 mg by mouth 4 (four) times daily as needed for diarrhea or loose stools.   MELATONIN 10 MG CAPS    Take 1 capsule by mouth at bedtime   METHOTREXATE SODIUM (METHOTREXATE, PF,) 50 MG/2ML INJECTION    Reported on 02/03/2016   METOPROLOL TARTRATE (LOPRESSOR) 25 MG TABLET    TAKE 1 TABLET(25 MG) BY MOUTH TWICE DAILY   NYSTATIN (MYCOSTATIN/NYSTOP) 100000 UNIT/GM POWD    APPLY 1 GRAM TOPICALLY TO THE AFFECTED AREA DAILY AS NEEDED FOR ITCHING   NYSTATIN-TRIAMCINOLONE (MYCOLOG II) CREAM    Apply 1 application topically 4 (four) times daily as needed (for rash). Apply under abdomen folds twice daily until rash healed   OMEGA-3 ACID ETHYL ESTERS (LOVAZA) 1 G CAPSULE    TAKE ONE CAPSULE BY MOUTH EVERY DAY   OXYCODONE (OXY IR/ROXICODONE) 5 MG IMMEDIATE RELEASE TABLET    Take one tablet every 6 hours as needed for severe pain, Take 2 tablets at bedtime.   PANTOPRAZOLE (PROTONIX) 40 MG TABLET    Take 1 tablet (40 mg total) by mouth 2 (two) times daily.   PILOCARPINE (SALAGEN) 5 MG TABLET    Take 5 mg by mouth daily. For dry mouth   POLYVINYL ALCOHOL (LIQUIFILM TEARS) 1.4 % OPHTHALMIC SOLUTION    Place 1 drop into both eyes as needed (for dry eyes).    POTASSIUM CHLORIDE (K-DUR) 10 MEQ TABLET    Take 10 mEq by mouth 2 (two) times daily as needed (Only when taking Lasix).   PROMETHAZINE (PHENERGAN) 12.5 MG TABLET    TAKE 1 TABLET BY MOUTH AS NEEDED FOR SEVERE NAUSEA AS DIRECTED   SPIRIVA HANDIHALER 18 MCG INHALATION CAPSULE    INHALE THE CONTENTS OF 1 CAPSULE VIA HANDIHALER  BY MOUTH DAILY FOR BREATHING   TRIAMTERENE-HYDROCHLOROTHIAZIDE (MAXZIDE-25) 37.5-25 MG TABLET    GIVE "Stassi" 1 TABLET BY MOUTH DAILY FOR BLOOD PRESSURE   VENLAFAXINE XR (EFFEXOR-XR) 37.5 MG 24 HR CAPSULE    TAKE 1 CAPSULE(37.5 MG) BY MOUTH DAILY WITH BREAKFAST   VICTOZA 18 MG/3ML SOPN    ADMINISTER 1.8 MG UNDER THE SKIN DAILY FOR BLOOD SUGAR   XELJANZ 5 MG TABS    Take 5 mg by mouth 2 (two) times daily. For Rheumatoid Arthritis  Modified Medications   No medications on file  Discontinued Medications   CHOLECALCIFEROL 2000 UNITS TABS    Take 2,000 Units by mouth daily.   CLINDAMYCIN (CLEOCIN) 300 MG CAPSULE    Take 1 capsule (300 mg total) by mouth 3 (three) times daily. X 7 days   DM-PHENYLEPHRINE-ACETAMINOPHEN 10-5-325 MG CAPS    Take 1 capsule by mouth daily as needed (for cold).   FOLIC ACID (FOLVITE) 1 MG TABLET    TK 1 T PO QD   GLUCOSE BLOOD (ACCU-CHEK SMARTVIEW) TEST STRIP    Use as instructed   OLOPATADINE (PATANOL) 0.1 % OPHTHALMIC SOLUTION    INSTILL 1 DROP IN RIGHT EYE TWICE DAILY FOR 1 WEEK   POTASSIUM CHLORIDE (MICRO-K) 10 MEQ CR CAPSULE    TAKE 1 CAPSULE BY MOUTH TWICE DAILY    Review of Systems  Respiratory: Negative for shortness of breath.  Cardiovascular: Positive for leg swelling. Negative for chest pain.  Musculoskeletal: Positive for back pain, joint swelling, arthralgias, gait problem and neck pain.  All other systems reviewed and are negative.   Filed Vitals:   02/03/16 1027  BP: 130/82  Pulse: 99  Temp: 98.5 F (36.9 C)  TempSrc: Oral  Resp: 14  Height: 5' 7"  (1.702 m)  Weight: 268 lb (121.564 kg)  SpO2: 95%   Body mass index is 41.96 kg/(m^2).  Physical Exam  Constitutional: She is oriented to person, place, and time. She appears well-developed and well-nourished.  HENT:  Mouth/Throat: Oropharynx is clear and moist. No oropharyngeal exudate.  Eyes: Pupils are equal, round, and reactive to light. No scleral icterus.  Neck: Neck supple. Carotid bruit  is not present. No tracheal deviation present. No thyromegaly present.  Cardiovascular: Normal rate, regular rhythm and intact distal pulses.  Exam reveals no gallop and no friction rub.   Murmur (1/6 SEM) heard. No LE edema b/l. no calf TTP.   Pulmonary/Chest: Effort normal and breath sounds normal. No stridor. No respiratory distress. She has no wheezes. She has no rales.  Abdominal: Soft. Bowel sounds are normal. She exhibits no distension and no mass. There is no hepatomegaly. There is no tenderness. There is no rebound and no guarding.  Musculoskeletal: She exhibits edema and tenderness.  Right greater trochanteric TTPwith swelling; left 2-5th MTP joint swelling/increased warmth/TTP; L>R ankle swelling with reduced ROM; multiple finger joint swelling and MCP joint swelling  Lymphadenopathy:    She has no cervical adenopathy.  Neurological: She is alert and oriented to person, place, and time.  Skin: Skin is warm and dry. No rash noted.  Psychiatric: She has a normal mood and affect. Her behavior is normal. Judgment and thought content normal.     Labs reviewed: Admission on 01/08/2016, Discharged on 01/08/2016  Component Date Value Ref Range Status  . WBC 01/08/2016 16.2* 4.0 - 10.5 K/uL Final  . RBC 01/08/2016 5.07  3.87 - 5.11 MIL/uL Final  . Hemoglobin 01/08/2016 13.8  12.0 - 15.0 g/dL Final  . HCT 01/08/2016 43.2  36.0 - 46.0 % Final  . MCV 01/08/2016 85.2  78.0 - 100.0 fL Final  . MCH 01/08/2016 27.2  26.0 - 34.0 pg Final  . MCHC 01/08/2016 31.9  30.0 - 36.0 g/dL Final  . RDW 01/08/2016 15.0  11.5 - 15.5 % Final  . Platelets 01/08/2016 286  150 - 400 K/uL Final  . Neutrophils Relative % 01/08/2016 73   Final  . Neutro Abs 01/08/2016 12.0* 1.7 - 7.7 K/uL Final  . Lymphocytes Relative 01/08/2016 14   Final  . Lymphs Abs 01/08/2016 2.2  0.7 - 4.0 K/uL Final  . Monocytes Relative 01/08/2016 10   Final  . Monocytes Absolute 01/08/2016 1.6* 0.1 - 1.0 K/uL Final  . Eosinophils  Relative 01/08/2016 2   Final  . Eosinophils Absolute 01/08/2016 0.3  0.0 - 0.7 K/uL Final  . Basophils Relative 01/08/2016 1   Final  . Basophils Absolute 01/08/2016 0.1  0.0 - 0.1 K/uL Final  . Sodium 01/08/2016 139  135 - 145 mmol/L Final  . Potassium 01/08/2016 3.5  3.5 - 5.1 mmol/L Final  . Chloride 01/08/2016 101  101 - 111 mmol/L Final  . CO2 01/08/2016 29  22 - 32 mmol/L Final  . Glucose, Bld 01/08/2016 138* 65 - 99 mg/dL Final  . BUN 01/08/2016 23* 6 - 20 mg/dL Final  . Creatinine, Ser 01/08/2016 0.88  0.44 - 1.00 mg/dL Final  . Calcium 01/08/2016 8.9  8.9 - 10.3 mg/dL Final  . Total Protein 01/08/2016 7.1  6.5 - 8.1 g/dL Final  . Albumin 01/08/2016 3.8  3.5 - 5.0 g/dL Final  . AST 01/08/2016 35  15 - 41 U/L Final  . ALT 01/08/2016 29  14 - 54 U/L Final  . Alkaline Phosphatase 01/08/2016 98  38 - 126 U/L Final  . Total Bilirubin 01/08/2016 0.8  0.3 - 1.2 mg/dL Final  . GFR calc non Af Amer 01/08/2016 >60  >60 mL/min Final  . GFR calc Af Amer 01/08/2016 >60  >60 mL/min Final   Comment: (NOTE) The eGFR has been calculated using the CKD EPI equation. This calculation has not been validated in all clinical situations. eGFR's persistently <60 mL/min signify possible Chronic Kidney Disease.   . Anion gap 01/08/2016 9  5 - 15 Final  . Troponin i, poc 01/08/2016 0.01  0.00 - 0.08 ng/mL Final  . Comment 3 01/08/2016          Final   Comment: Due to the release kinetics of cTnI, a negative result within the first hours of the onset of symptoms does not rule out myocardial infarction with certainty. If myocardial infarction is still suspected, repeat the test at appropriate intervals.   . Color, Urine 01/08/2016 YELLOW  YELLOW Final  . APPearance 01/08/2016 CLEAR  CLEAR Final  . Specific Gravity, Urine 01/08/2016 >1.046* 1.005 - 1.030 Final  . pH 01/08/2016 6.5  5.0 - 8.0 Final  . Glucose, UA 01/08/2016 NEGATIVE  NEGATIVE mg/dL Final  . Hgb urine dipstick 01/08/2016 NEGATIVE   NEGATIVE Final  . Bilirubin Urine 01/08/2016 NEGATIVE  NEGATIVE Final  . Ketones, ur 01/08/2016 NEGATIVE  NEGATIVE mg/dL Final  . Protein, ur 01/08/2016 NEGATIVE  NEGATIVE mg/dL Final  . Nitrite 01/08/2016 NEGATIVE  NEGATIVE Final  . Leukocytes, UA 01/08/2016 NEGATIVE  NEGATIVE Final   MICROSCOPIC NOT DONE ON URINES WITH NEGATIVE PROTEIN, BLOOD, LEUKOCYTES, NITRITE, OR GLUCOSE <1000 mg/dL.  Office Visit on 12/28/2015  Component Date Value Ref Range Status  . Hgb A1c MFr Bld 12/28/2015 7.3* 4.8 - 5.6 % Final   Comment:          Pre-diabetes: 5.7 - 6.4          Diabetes: >6.4          Glycemic control for adults with diabetes: <7.0   . Est. average glucose Bld gHb Est-m* 12/28/2015 163   Final  . Glucose 12/28/2015 113* 65 - 99 mg/dL Final  . BUN 12/28/2015 24  8 - 27 mg/dL Final  . Creatinine, Ser 12/28/2015 1.05* 0.57 - 1.00 mg/dL Final  . GFR calc non Af Amer 12/28/2015 52* >59 mL/min/1.73 Final  . GFR calc Af Amer 12/28/2015 60  >59 mL/min/1.73 Final  . BUN/Creatinine Ratio 12/28/2015 23  12 - 28 Final  . Sodium 12/28/2015 141  134 - 144 mmol/L Final  . Potassium 12/28/2015 4.1  3.5 - 5.2 mmol/L Final  . Chloride 12/28/2015 96  96 - 106 mmol/L Final  . CO2 12/28/2015 27  18 - 29 mmol/L Final  . Calcium 12/28/2015 9.5  8.7 - 10.3 mg/dL Final  . Total Protein 12/28/2015 6.6  6.0 - 8.5 g/dL Final  . Albumin 12/28/2015 4.0  3.5 - 4.8 g/dL Final  . Globulin, Total 12/28/2015 2.6  1.5 - 4.5 g/dL Final  . Albumin/Globulin Ratio 12/28/2015 1.5  1.2 - 2.2 Final  .  Bilirubin Total 12/28/2015 0.3  0.0 - 1.2 mg/dL Final  . Alkaline Phosphatase 12/28/2015 113  39 - 117 IU/L Final  . AST 12/28/2015 59* 0 - 40 IU/L Final  . ALT 12/28/2015 40* 0 - 32 IU/L Final  . Creatinine, Urine 12/28/2015 83.1  Not Estab. mg/dL Final  . Microalbum.,U,Random 12/28/2015 3.8  Not Estab. ug/mL Final  . MICROALB/CREAT RATIO 12/28/2015 4.6  0.0 - 30.0 mg/g creat Final    Dg Chest 2 View  01/08/2016  CLINICAL  DATA:  Neck pain and difficulty swallowing. EXAM: CHEST  2 VIEW COMPARISON:  Chest x-ray dated 11/03/2014. FINDINGS: Heart size is normal. Cardiomediastinal silhouette is stable in size and configuration. Atherosclerotic changes again noted at the aortic arch. Median sternotomy wires appear intact and stable in alignment. Lungs are clear. Perhaps mild scarring at the left lung base. No evidence of pneumonia. No pleural effusion or pneumothorax seen. No acute- appearing osseous abnormality. IMPRESSION: No evidence of acute cardiopulmonary abnormality. No evidence of pneumonia. Electronically Signed   By: Franki Cabot M.D.   On: 01/08/2016 19:51   Dg Lumbar Spine Complete  01/08/2016  CLINICAL DATA:  Chronic right hip pain, worsened in the last 1-1/2 weeks. No known injury. Neck pain for which a CT neck with contrast was performed earlier today. EXAM: LUMBAR SPINE - COMPLETE 4+ VIEW COMPARISON:  Plain film of the lumbar spine dated 10/05/2012. FINDINGS: Alignment is stable. The mild dextroscoliosis is stable. Mild degenerative change again noted within the lumbar spine, with slight disc space narrowings and minimal osseous spurring at multiple levels. The degree of degenerative change appears stable compared to the previous exam. The chronic mild compression deformity of the L1 vertebral body is stable. No evidence of acute fracture. No acute or suspicious osseous lesion. Upper sacrum appears intact and normally aligned. Atherosclerotic changes are again seen along the walls of the infrarenal abdominal aorta. Additional vascular calcifications noted within the upper abdomen. Paravertebral soft tissues otherwise unremarkable. Incidental note is made of contrast within the collecting system from earlier contrast-enhanced CT. IMPRESSION: 1. Mild degenerative change within lumbar spine, stable in degree when compared to the earlier exam of 10/05/2012. 2. No acute findings. Electronically Signed   By: Franki Cabot  M.D.   On: 01/08/2016 19:50   Ct Soft Tissue Neck W Contrast  01/08/2016  CLINICAL DATA:  Left-sided neck pain. Pain worse with movement. Pain with swallowing. EXAM: CT NECK WITH CONTRAST TECHNIQUE: Multidetector CT imaging of the neck was performed using the standard protocol following the bolus administration of intravenous contrast. CONTRAST:  49m ISOVUE-300 IOPAMIDOL (ISOVUE-300) INJECTION 61% COMPARISON:  MRI of the cervical spine 05/23/2013. FINDINGS: Pharynx and larynx: There is mild prominence below the angle tonsillar tissue without focal mass lesion. No focal mucosal or submucosal lesions are present. Vocal cords are midline. Salivary glands: Submandibular and parotid glands are normal bilaterally. Thyroid: The thyroid is mildly prominent and heterogeneous. A focal calcification is present on the left. No dominant nodule was present to warrant follow-up. Lymph nodes: Benign appearing reactive type level 2 lymph nodes are present bilaterally. No pathologically enlarged or malignant appearing nodes are present. Vascular: Atherosclerotic calcifications are present at the carotid bifurcations bilaterally without definite stenosis. Dense atherosclerotic calcifications are present within the cavernous internal carotid arteries bilaterally cavernous segment stenoses are suspected bilaterally, more likely on the left. Limited intracranial: No acute intracranial abnormality is present. Visualized orbits: Globes and orbits are intact. Mastoids and visualized paranasal sinuses: The paranasal sinuses  and mastoid air cells are clear. Skeleton: Mild facet degenerative changes are most prominent at C3-4 on the right and bilaterally at C7-T1. Vertebral body heights alignment are maintained. There straightening of the normal cervical lordosis. No focal lytic or blastic lesions are present. Upper chest: The lung apices are clear bilaterally IMPRESSION: 1. No acute or focal lesion to explain soft tissue neck pain. 2.  Mild prominence of the lingual tonsils and bilateral level 2 lymph nodes may represent acute inflammatory process. There is no discrete mass lesion. 3. Multilevel facet degenerative changes could contribute neck pain. Electronically Signed   By: San Morelle M.D.   On: 01/08/2016 18:49   Dg Hip Unilat With Pelvis 2-3 Views Right  01/08/2016  CLINICAL DATA:  Pain without trauma. EXAM: DG HIP (WITH OR WITHOUT PELVIS) 2-3V RIGHT COMPARISON:  None. FINDINGS: There are degenerative changes in the right hip with no acute fractures or dislocations identified. IMPRESSION: Degenerative changes in the right hip with no fractures or dislocations. Electronically Signed   By: Dorise Bullion III M.D   On: 01/08/2016 19:47     Assessment/Plan   ICD-9-CM ICD-10-CM   1. Swelling of multiple joints due to #2 719.09 M25.40   2. Rheumatoid arthritis involving multiple sites, unspecified rheumatoid factor presence (HCC) - probable flare 714.0 M06.9   3. Bilateral leg edema - improved 782.3 R60.0   4. Chronic fatigue fibromyalgia syndrome - stable 780.71 R53.82    Swelling likely due to RA flare. Follow up with Dr Sallyanne Havers as scheduled  Continue current medications as ordered  Follow up as scheduled.  Have labs faxed to office when done. Rheum has ordered CMP, CBC and TB GOLD to be drawn today at lab  [facility]. Perlie Gold  [facility] [address] [place], [place] [address] [phone] Cell (Monday-Friday 8 AM - 5 PM) [phone] After 5 PM and follow prompts

## 2016-02-03 NOTE — Patient Instructions (Signed)
Swelling likely due to RA flare. Follow up with Dr Sallyanne Havers as scheduled  Continue current medications as ordered  Follow up as scheduled.  Have labs faxed to office when done

## 2016-02-22 ENCOUNTER — Other Ambulatory Visit: Payer: Self-pay

## 2016-02-22 DIAGNOSIS — M797 Fibromyalgia: Secondary | ICD-10-CM

## 2016-02-22 DIAGNOSIS — G9332 Myalgic encephalomyelitis/chronic fatigue syndrome: Secondary | ICD-10-CM

## 2016-02-22 DIAGNOSIS — G8929 Other chronic pain: Secondary | ICD-10-CM

## 2016-02-22 DIAGNOSIS — R5382 Chronic fatigue, unspecified: Secondary | ICD-10-CM

## 2016-02-22 DIAGNOSIS — M545 Low back pain, unspecified: Secondary | ICD-10-CM

## 2016-02-22 MED ORDER — OXYCODONE HCL 5 MG PO TABS: ORAL_TABLET | ORAL | Status: DC

## 2016-02-22 MED ORDER — HYDROCODONE-ACETAMINOPHEN 10-325 MG PO TABS: ORAL_TABLET | ORAL | Status: DC

## 2016-02-22 NOTE — Telephone Encounter (Signed)
RX printed and placed on ledge for signature

## 2016-02-26 ENCOUNTER — Ambulatory Visit: Payer: Medicare Other | Admitting: Internal Medicine

## 2016-03-06 ENCOUNTER — Other Ambulatory Visit: Payer: Self-pay | Admitting: Pharmacotherapy

## 2016-03-11 ENCOUNTER — Other Ambulatory Visit: Payer: Self-pay | Admitting: Internal Medicine

## 2016-03-17 ENCOUNTER — Ambulatory Visit (INDEPENDENT_AMBULATORY_CARE_PROVIDER_SITE_OTHER): Payer: Medicare Other | Admitting: Podiatry

## 2016-03-17 VITALS — BP 169/89 | HR 92 | Resp 14

## 2016-03-17 DIAGNOSIS — L089 Local infection of the skin and subcutaneous tissue, unspecified: Secondary | ICD-10-CM | POA: Diagnosis not present

## 2016-03-17 DIAGNOSIS — S90822A Blister (nonthermal), left foot, initial encounter: Secondary | ICD-10-CM | POA: Diagnosis not present

## 2016-03-17 MED ORDER — AZITHROMYCIN 250 MG PO TABS: ORAL_TABLET | ORAL | 0 refills | Status: DC

## 2016-03-17 NOTE — Progress Notes (Signed)
This patient presents to the office saying she has developed 2 lack areas under the left big toe besides her painful callus , left foot. Patient was seen back in February and diagnosis having a callus under the IPJ left hallux. She says she picked up padding, which helped but has not worn the padding for a long time.  She now presents saying that she has 2 black spots under the left foot, which has developed in the last week. She says she has felt pressure from the sites, but no drainage has occurred. She denies any increased activity in the last week. He does have a history of DPM and RA. She presents the office for an evaluation and treatment  GENERAL APPEARANCE: Alert, conversant. Appropriately groomed. No acute distress.  VASCULAR: Pedal pulses are  palpable at  [facility] and PT bilateral.  Capillary refill time is immediate to all digits,  Normal temperature gradient.  Digital hair growth is present bilateral  NEUROLOGIC: sensation is diminished l to 5.07 monofilament at 5/5 sites bilateral.  Light touch is intact bilateral, Muscle strength normal.  MUSCULOSKELETAL: acceptable muscle strength, tone and stability bilateral.  Intrinsic muscluature intact bilateral.  Rectus appearance of foot and digits noted bilateral. Lipoma left ankle.  DERMATOLOGIC: skin color, texture, and turgor are within normal limits except left hallux.  There is a callus under IPJ left hallux.  There are two blood filled blisters under left hallux.  One blister is distal to callus and one is lateral to the callus. No evidence of redness or swelling.  Fluctuance noted inside blackened blisters left hallux.  NAILS  Thick disfigured discolored nail right hallux.  Blood blisters/callus left hallux.   Tx:  ROV.  Incision and drainage blood blister.  Neosporin/DSD applied.  Home instructions given.  Prescribe z-pack since she allergic to cephalexin and doxycycline. RTC 2 weeks.   Gardiner Barefoot DPM

## 2016-03-20 ENCOUNTER — Other Ambulatory Visit: Payer: Self-pay | Admitting: Internal Medicine

## 2016-03-24 ENCOUNTER — Encounter: Payer: Self-pay | Admitting: Internal Medicine

## 2016-03-24 ENCOUNTER — Other Ambulatory Visit: Payer: Self-pay

## 2016-03-24 ENCOUNTER — Ambulatory Visit (INDEPENDENT_AMBULATORY_CARE_PROVIDER_SITE_OTHER): Payer: Medicare Other | Admitting: Internal Medicine

## 2016-03-24 VITALS — BP 150/70 | HR 100 | Temp 98.0°F | Resp 97 | Wt 262.0 lb

## 2016-03-24 DIAGNOSIS — M797 Fibromyalgia: Secondary | ICD-10-CM

## 2016-03-24 DIAGNOSIS — E162 Hypoglycemia, unspecified: Secondary | ICD-10-CM | POA: Diagnosis not present

## 2016-03-24 DIAGNOSIS — M545 Low back pain: Secondary | ICD-10-CM

## 2016-03-24 DIAGNOSIS — E1142 Type 2 diabetes mellitus with diabetic polyneuropathy: Secondary | ICD-10-CM | POA: Diagnosis not present

## 2016-03-24 DIAGNOSIS — M25551 Pain in right hip: Secondary | ICD-10-CM | POA: Diagnosis not present

## 2016-03-24 DIAGNOSIS — R5382 Chronic fatigue, unspecified: Principal | ICD-10-CM

## 2016-03-24 DIAGNOSIS — R278 Other lack of coordination: Secondary | ICD-10-CM

## 2016-03-24 DIAGNOSIS — R270 Ataxia, unspecified: Secondary | ICD-10-CM | POA: Diagnosis not present

## 2016-03-24 DIAGNOSIS — G9332 Myalgic encephalomyelitis/chronic fatigue syndrome: Secondary | ICD-10-CM

## 2016-03-24 DIAGNOSIS — L659 Nonscarring hair loss, unspecified: Secondary | ICD-10-CM

## 2016-03-24 DIAGNOSIS — T148 Other injury of unspecified body region: Secondary | ICD-10-CM | POA: Diagnosis not present

## 2016-03-24 DIAGNOSIS — Z8669 Personal history of other diseases of the nervous system and sense organs: Secondary | ICD-10-CM

## 2016-03-24 DIAGNOSIS — Z794 Long term (current) use of insulin: Secondary | ICD-10-CM

## 2016-03-24 DIAGNOSIS — G8929 Other chronic pain: Secondary | ICD-10-CM

## 2016-03-24 DIAGNOSIS — E785 Hyperlipidemia, unspecified: Secondary | ICD-10-CM | POA: Diagnosis not present

## 2016-03-24 DIAGNOSIS — T148XXA Other injury of unspecified body region, initial encounter: Secondary | ICD-10-CM

## 2016-03-24 MED ORDER — OXYCODONE HCL 5 MG PO TABS: ORAL_TABLET | ORAL | 0 refills | Status: DC

## 2016-03-24 MED ORDER — HYDROCODONE-ACETAMINOPHEN 10-325 MG PO TABS: ORAL_TABLET | ORAL | 0 refills | Status: DC

## 2016-03-24 NOTE — Addendum Note (Signed)
Addended by: Despina Hidden on: 03/24/2016 04:49 PM   Modules accepted: Orders

## 2016-03-24 NOTE — Progress Notes (Signed)
Location:  [facility] clinic Provider:  Makenzi Bannister L. Mariea Clonts, D.O., C.M.D.  Goals of Care:  Advanced Directives 03/24/2016  Does patient have an advance directive? No  Type of Advance Directive -  Does patient want to make changes to advanced directive? -  Copy of advanced directive(s) in chart? -  Would patient like information on creating an advanced directive? -  Pre-existing out of facility DNR order (yellow form or pink MOST form) -     Chief Complaint  Patient presents with  . Medical Management of Chronic Issues    3 mth follow-up    HPI: Patient is a 75 y.o. female seen today for medical management of chronic diseases.  Today was a bad day and she had a concern about everything--she actually had a list with her this time.  She has lost 6 lbs.  She's on victoza, toujeo.  Is dieting and eating right.  Now sugar is dropping about 2 hrs after meals.  In 49s and even had a 36 a while back.  Then she has to drink and eat sweet things to get it back up.  She's frustrated.  Sees [facility] again in 2 wks.  Insulin was decreased some last time.  Doesn't have meter for me to review so I opted not to change things now.  Says she needs a referral to Dr. Rennis Golden to get in to see him b/c of increased weakness, discoordination--she's afraid her Paulino Door Josefa Half is coming on.  Can't walk straight.  Has to think about where she is going to walk beforehand to move properly.  Reaching for things and missing. Feels like little worms eating all her nerves and muscles.  Notices when she is working on her tablet and typing, she cannot do it properly.  She was a Agricultural engineer.  I did not witness any of this during her visit.  She had difficulty standing due to right hip pain.  Right hip--she thinks she has a pinched nerve.  Saw somebody at [facility] and they said bursitis (Dr. Darl Householder).  She says she pinched a nerve when she was walking.  She did get a shot in it and it didn't help. Xrays  showed degenerative changes.  Seems she already has a diagnosis, but doesn't agree with it.  She plans to discuss this with Dr. Estanislado Pandy.  Suddenly having tremendous hair loss.  Also feels like she's getting a mustache.  She has not started the methotrexate so it's not that.  Says a lot of female pattern baldness is in her family.  Feels weak and can't walk or stand up for 5 mins.    Skipping of her heart.  Sometimes has pains into her throat.  Doesn't know what's causing it.  Thinks one of the meds is to help.  Is on metoprolol for her bp and to help with tachycardia.    Past Medical History:  Diagnosis Date  . Abnormal weight gain   . Acute infective polyneuritis ([place]) 2002  . Acute maxillary sinusitis   . Acute sinusitis, unspecified   . Allergic rhinitis due to pollen   . Arthritis   . Back injury   . Candidiasis of skin and nails   . Candidiasis of vulva and vagina   . Chronic pain syndrome   . COPD (chronic obstructive pulmonary disease) ([place])   . Cough   . Degenerative arthritis   . Depressive disorder, not elsewhere classified   . Diabetes mellitus without complication ([place])   .  Diaphragmatic hernia without mention of obstruction or gangrene   . Dyslipidemia   . Edema   . Fibromyalgia   . GERD (gastroesophageal reflux disease)   . Guillain-Barre syndrome ([place])   . History of benign thymus tumor   . Hypertension   . Insomnia, unspecified   . Lumbago   . Miscarriage 1962  . Mixed hyperlipidemia   . Morbid obesity ([place])   . Obstructive chronic bronchitis with acute bronchitis ([place])   . Osteopenia, senile   . Osteoporosis   . Other malaise and fatigue   . Other specified disease of white blood cells   . Pain in joint, multiple sites   . Polyneuropathy in diabetes(357.2)   . RA (rheumatoid arthritis) ([place])   . Reflux esophagitis   . Rheumatoid arthritis(714.0)   . Shortness of breath   . Spinal stenosis, lumbar region, without neurogenic claudication   .  Spondylosis, lumbosacral   . Spontaneous ecchymoses   . Stiffness of joints, not elsewhere classified, multiple sites   . Tear film insufficiency, unspecified   . Thyroid disease   . Thyroid disorder   . Type II or unspecified type diabetes mellitus with peripheral circulatory disorders, uncontrolled(250.72)   . Unspecified chronic bronchitis ([place])   . Unspecified essential hypertension   . Unspecified hypothyroidism   . Unspecified pruritic disorder   . Unspecified urinary incontinence   . Urinary tract infection, site not specified     Past Surgical History:  Procedure Laterality Date  . ABDOMINAL HYSTERECTOMY  1974  . abdominal tumor  2002  . APPENDECTOMY    . [place]  . BRONCHOSCOPY  2001  . CHOLECYSTECTOMY  1984  . KNEE SURGERY Bilateral 08/27/2010 (L) and 01/11/2011 (R)  . [place]  . OVARIAN CYST SURGERY  1968  . thymus tumor    . 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 TONSILLECTOMY      Allergies  Allergen Reactions  . Penicillins Hives, Itching, Swelling and Rash  . Sulfamethoxazole-Trimethoprim Itching      Medication List       Accurate as of 03/24/16  3:33 PM. Always use your most recent med list.          ACCU-CHEK SMARTVIEW test strip Generic drug:  glucose blood Check blood sugar 3-4 times daily   amLODipine 10 MG tablet Commonly known as:  NORVASC TAKE 1 TABLET(10 MG) BY MOUTH DAILY   aspirin EC 81 MG tablet Take 81 mg by mouth daily as needed.   furosemide 20 MG tablet Commonly known as:  LASIX Take 1 tablet (20 mg total) by mouth 2 (two) times daily as needed. Use as needed for 3 pounds gain in one day or 5 pounds in one week   gabapentin 600 MG tablet Commonly known as:  NEURONTIN TAKE 1 TABLET BY MOUTH THREE TIMES DAILY   HUMALOG KWIKPEN 200 UNIT/ML Sopn Generic drug:  Insulin Lispro INJECT 20 UNITS INTO THE SKIN THREE TIMES DAILY BEFORE A MEAL   HYDROcodone-acetaminophen 10-325 MG tablet Commonly known as:  NORCO Take one  tablet every three hours as needed for pain   Insulin Syringe-Needle U-100 31G X 5/16" 1 ML Misc Use once daily with administration of Insulin DX E11.59   levothyroxine 150 MCG tablet Commonly known as:  SYNTHROID, LEVOTHROID TAKE 1 TABLET BY MOUTH 30 MINUTES BEFORE BREAKFAST   lidocaine 5 % Commonly known as:  LIDODERM Apply patch to bilateral knees at bedtime.  Remove & Discard patch  within 12 hours or as directed by MD   lisinopril 10 MG tablet Commonly known as:  PRINIVIL,ZESTRIL TAKE 1 TABLET(10 MG) BY MOUTH TWICE DAILY   loperamide 2 MG tablet Commonly known as:  IMODIUM A-D Take 2 mg by mouth 4 (four) times daily as needed for diarrhea or loose stools.   Melatonin 10 MG Caps Take 1 capsule by mouth at bedtime   methotrexate (PF) 50 MG/2ML injection Reported on 02/03/2016   metoprolol tartrate 25 MG tablet Commonly known as:  LOPRESSOR TAKE 1 TABLET(25 MG) BY MOUTH TWICE DAILY   nystatin powder Generic drug:  nystatin APPLY 1 GRAM TOPICALLY TO THE AFFECTED AREA DAILY AS NEEDED FOR ITCHING   nystatin-triamcinolone cream Commonly known as:  MYCOLOG II Apply 1 application topically 4 (four) times daily as needed (for rash). Apply under abdomen folds twice daily until rash healed   omega-3 acid ethyl esters 1 g capsule Commonly known as:  LOVAZA TAKE ONE CAPSULE BY MOUTH EVERY DAY   oxyCODONE 5 MG immediate release tablet Commonly known as:  Oxy IR/ROXICODONE Take one tablet every 6 hours as needed for severe pain, Take 2 tablets at bedtime.   pantoprazole 40 MG tablet Commonly known as:  PROTONIX Take 1 tablet (40 mg total) by mouth 2 (two) times daily.   Pen Needles 31G X 5 MM Misc 1 each by Does not apply route as directed.   pilocarpine 5 MG tablet Commonly known as:  SALAGEN Take 5 mg by mouth daily. For dry mouth   polyvinyl alcohol 1.4 % ophthalmic solution Commonly known as:  LIQUIFILM TEARS Place 1 drop into both eyes as needed (for dry eyes).     potassium chloride 10 MEQ tablet Commonly known as:  K-DUR Take 10 mEq by mouth 2 (two) times daily as needed (Only when taking Lasix).   promethazine 12.5 MG tablet Commonly known as:  PHENERGAN TAKE 1 TABLET BY MOUTH AS NEEDED FOR SEVERE NAUSEA AS DIRECTED   SPIRIVA HANDIHALER 18 MCG inhalation capsule Generic drug:  tiotropium INHALE THE CONTENTS OF 1 CAPSULE VIA HANDIHALER BY MOUTH DAILY FOR BREATHING   TOUJEO SOLOSTAR 300 UNIT/ML Sopn Generic drug:  Insulin Glargine Inject 90 Units into the skin at bedtime.   triamterene-hydrochlorothiazide 37.5-25 MG tablet Commonly known as:  MAXZIDE-25 GIVE "Malky" 1 TABLET BY MOUTH DAILY FOR BLOOD PRESSURE   venlafaxine XR 37.5 MG 24 hr capsule Commonly known as:  EFFEXOR-XR TAKE 1 CAPSULE(37.5 MG) BY MOUTH DAILY WITH BREAKFAST   VICTOZA 18 MG/3ML Sopn Generic drug:  Liraglutide ADMINISTER 1.8 MG UNDER THE SKIN DAILY FOR BLOOD SUGAR   Vitamin D 2000 units tablet Take 2,000 Units by mouth daily.   XELJANZ 5 MG Tabs Generic drug:  Tofacitinib Citrate Take 5 mg by mouth 2 (two) times daily. For Rheumatoid Arthritis       Review of Systems:  Review of Systems  Constitutional: Positive for malaise/fatigue and weight loss. Negative for chills and fever.  HENT: Negative for hearing loss.   Eyes: Negative for blurred vision.  Respiratory: Negative for cough, shortness of breath and wheezing.   Cardiovascular: Positive for palpitations. Negative for chest pain, leg swelling and PND.  Gastrointestinal: Negative for abdominal pain, blood in stool, constipation, diarrhea and melena.  Genitourinary: Negative for dysuria.  Musculoskeletal: Positive for joint pain and myalgias. Negative for falls.  Skin: Negative for itching and rash.  Neurological: Positive for tingling, sensory change and weakness. Negative for dizziness, focal weakness and headaches.  Endo/Heme/Allergies: Bruises/bleeds easily.  Psychiatric/Behavioral: Positive for  depression. Negative for memory loss. The patient is nervous/anxious.     Health Maintenance  Topic Date Due  . OPHTHALMOLOGY EXAM  12/20/1950  . ZOSTAVAX  12/19/2000  . PNA vac Low Risk Adult (1 of 2 - PCV13) 12/19/2005  . INFLUENZA VACCINE  05/16/2016 (Originally 03/22/2016)  . TETANUS/TDAP  08/22/2017 (Originally 12/20/1959)  . HEMOGLOBIN A1C  06/29/2016  . FOOT EXAM  09/27/2016  . COLONOSCOPY  02/10/2025  . DEXA SCAN  09/20/2025    Physical Exam: Vitals:   03/24/16 1522  BP: (!) 150/70  Pulse: 100  Resp: (!) 97  Temp: 98 F (36.7 C)  TempSrc: Oral   There is no height or weight on file to calculate BMI. Physical Exam  Constitutional: She is oriented to person, place, and time. She appears well-developed and well-nourished. No distress.  HENT:  Head: Normocephalic and atraumatic.  Eyes: EOM are normal. Pupils are equal, round, and reactive to light.  Neck: Normal range of motion. Neck supple.  Cardiovascular: Regular rhythm, normal heart sounds and intact distal pulses.   tachy  Pulmonary/Chest: Effort normal and breath sounds normal. She has no wheezes.  Abdominal: Soft. Bowel sounds are normal.  Musculoskeletal:  Difficulty getting up due to right hip pain, tender over greater trochanter, limps, using cane only  Neurological: She is alert and oriented to person, place, and time.  Skin: Skin is warm and dry.  Psychiatric:  Anxious and jittery today    Labs reviewed: Basic Metabolic Panel:  Recent Labs  07/28/15 1041 09/25/15 0858 09/25/15 0859 12/28/15 1016 01/08/16 1723  NA 140  --  141 141 139  K 4.2  --  3.9 4.1 3.5  CL 98  --  95* 96 101  CO2 24  --  26 27 29   GLUCOSE 116*  --  54* 113* 138*  BUN 20  --  20 24 23*  CREATININE 0.99  --  1.05* 1.05* 0.88  CALCIUM 9.6  --  9.7 9.5 8.9  TSH 0.344* 0.558  --   --   --    Liver Function Tests:  Recent Labs  09/25/15 0859 12/28/15 1016 01/08/16 1723  AST 62* 59* 35  ALT 43* 40* 29  ALKPHOS 124*  113 98  BILITOT 0.3 0.3 0.8  PROT 7.2 6.6 7.1  ALBUMIN 4.2 4.0 3.8   No results for input(s): LIPASE, AMYLASE in the last 8760 hours. No results for input(s): AMMONIA in the last 8760 hours. CBC:  Recent Labs  07/28/15 1041 01/08/16 1723  WBC 15.2* 16.2*  NEUTROABS 10.5* 12.0*  HGB  --  13.8  HCT 42.8 43.2  MCV 83 85.2  PLT 338 286   Lipid Panel: No results for input(s): CHOL, HDL, LDLCALC, TRIG, CHOLHDL, LDLDIRECT in the last 8760 hours. Lab Results  Component Value Date   HGBA1C 7.3 (H) 12/28/2015     Assessment/Plan 1. Right hip pain -seems to be a new development -has had a diagnosis of bursitis at orthopedics and got a steroid injection w/o relief and she thinks she has a pinched nerve -seems to be her hip arthritis or bursitis that did not respond to the shot to me -is on pain medications already -continues to be ambulatory with a cane at this point which has been a great improvement overall since she's been on the xeljanz for her RA  2. Loss of coordination - exam was not remarkable or suggestive of a return  of Guillain-Barre but she is concerned and wants to see Dr. Jannifer Franklin for it - Ambulatory referral to Neurology  3. Blood blister -of left great toe, s/p debridement by podiatry, keep f/u visit as planned  4. Type 2 diabetes mellitus with diabetic polyneuropathy, with long-term current use of insulin (HCC) -control much improved, but now getting lows and insuline was reduced once -keep f/u with [facility] and bring meter so she has more information to go on  5. Hypoglycemia -about 2 hrs postprandially she reports--need cbg results to determine insulin adjustments--is treating the lows appropriately  6. Chronic fatigue fibromyalgia syndrome -ongoing--had been doing quite well for several visits with a short list of concerns, but today everything was going badly  7. History of Guillain-Barre syndrome - does not seem to have any new signs on exam, but she  reports more balance problems, lack of coordination, having to concentrate more to execute tasks properly with her hands or feet -requests neuro referral - Ambulatory referral to Neurology  8. Hyperlipidemia - cont to work on diet and exercise, not on a statin at this time due to prior myalgias - Lipid panel; Future to reassess  9. Hair loss - unclear if this is due to one of her drugs (reviewed some and didn't see alopecia in list)--has not started methotrexate yet so not from it - TSH; Future to reassess (add to other labs)  Labs/tests ordered:   Orders Placed This Encounter  Procedures  . Lipid panel    Standing Status:   Future    Standing Expiration Date:   03/24/2017  . TSH    Standing Status:   Future    Standing Expiration Date:   03/24/2017  . Ambulatory referral to Neurology    Referral Priority:   Routine    Referral Type:   Consultation    Referral Reason:   Specialty Services Required    Requested Specialty:   Neurology    Number of Visits Requested:   1   Next appt:  04/01/2016 [facility] [place]. Sherald Balbuena, D.O. [facility] [address], [place] [address] Cell Phone (Mon-Fri 8am-5pm):  [phone] On Call:  [phone] & follow prompts after 5pm & weekends Office Phone:  [phone] Office Fax:  [phone]

## 2016-03-31 ENCOUNTER — Ambulatory Visit (INDEPENDENT_AMBULATORY_CARE_PROVIDER_SITE_OTHER): Payer: Medicare Other | Admitting: Podiatry

## 2016-03-31 DIAGNOSIS — L989 Disorder of the skin and subcutaneous tissue, unspecified: Secondary | ICD-10-CM

## 2016-03-31 DIAGNOSIS — L089 Local infection of the skin and subcutaneous tissue, unspecified: Secondary | ICD-10-CM

## 2016-03-31 DIAGNOSIS — S90822A Blister (nonthermal), left foot, initial encounter: Secondary | ICD-10-CM | POA: Diagnosis not present

## 2016-03-31 NOTE — Progress Notes (Signed)
This patient returns to the office follow-up for bloody blisters under the left big toe on her left foot. She came in 2 weeks ago and was diagnosed with the blister and incision and drainage of the blood blister was performed. She was given home soaking instructions. She was also prescribed a Z-Pak. She presents the office today stating that there still is a callus under her left big toe, but there has been no drainage for the last few days. No pain or discomfort noted on the left big toe. She presents to the office for evaluation and treatment  GENERAL APPEARANCE: Alert, conversant. Appropriately groomed. No acute distress.  VASCULAR: Pedal pulses are  palpable at  [facility] and PT bilateral.  Capillary refill time is immediate to all digits,  Normal temperature gradient.   NEUROLOGIC: sensation is diminished  to 5.07 monofilament at 5/5 sites bilateral.  Light touch is intact bilateral, Muscle strength normal.  MUSCULOSKELETAL: acceptable muscle strength, tone and stability bilateral.  Intrinsic muscluature intact bilateral.  Rectus appearance of foot and digits noted bilateral. IPJ arthritis left hallux.  DERMATOLOGIC:  Callus sub 1 left foot.  Blister under the left hallux has healed with no evidence of drainage. Told to discontinue soaks.  RTC 2 months.  Initiate diabetic shoes paperwork for DPN and IPJ arthritis with pre-ulcerous lesion left hallux.   Gardiner Barefoot DPM

## 2016-04-01 ENCOUNTER — Other Ambulatory Visit: Payer: Self-pay | Admitting: Pharmacotherapy

## 2016-04-01 ENCOUNTER — Other Ambulatory Visit: Payer: Medicare Other

## 2016-04-01 DIAGNOSIS — E785 Hyperlipidemia, unspecified: Secondary | ICD-10-CM

## 2016-04-01 DIAGNOSIS — Z794 Long term (current) use of insulin: Secondary | ICD-10-CM

## 2016-04-01 DIAGNOSIS — L659 Nonscarring hair loss, unspecified: Secondary | ICD-10-CM

## 2016-04-01 DIAGNOSIS — E1142 Type 2 diabetes mellitus with diabetic polyneuropathy: Secondary | ICD-10-CM

## 2016-04-01 LAB — LIPID PANEL
Cholesterol: 143 mg/dL (ref 125–200)
HDL: 46 mg/dL (ref >=46)
LDL Cholesterol: 80 mg/dL (ref <130)
Total CHOL/HDL Ratio: 3.1 Ratio (ref <=5.0)
Triglycerides: 85 mg/dL (ref <150)
VLDL: 17 mg/dL (ref <30)

## 2016-04-01 LAB — COMPREHENSIVE METABOLIC PANEL
ALT: 38 U/L — ABNORMAL HIGH (ref 6–29)
AST: 37 U/L — ABNORMAL HIGH (ref 10–35)
Albumin: 3.6 g/dL (ref 3.6–5.1)
Alkaline Phosphatase: 102 U/L (ref 33–130)
BUN: 22 mg/dL (ref 7–25)
CO2: 28 mmol/L (ref 20–31)
Calcium: 9.4 mg/dL (ref 8.6–10.4)
Chloride: 104 mmol/L (ref 98–110)
Creat: 0.82 mg/dL (ref 0.60–0.93)
Glucose, Bld: 63 mg/dL — ABNORMAL LOW (ref 65–99)
Potassium: 3.9 mmol/L (ref 3.5–5.3)
Sodium: 142 mmol/L (ref 135–146)
Total Bilirubin: 0.3 mg/dL (ref 0.2–1.2)
Total Protein: 6.3 g/dL (ref 6.1–8.1)

## 2016-04-01 LAB — HEMOGLOBIN A1C
Hgb A1c MFr Bld: 6.6 % — ABNORMAL HIGH (ref <5.7)
Mean Plasma Glucose: 143 mg/dL

## 2016-04-01 LAB — TSH: TSH: 0.23 mIU/L — ABNORMAL LOW

## 2016-04-04 ENCOUNTER — Encounter: Payer: Self-pay | Admitting: Pharmacotherapy

## 2016-04-04 ENCOUNTER — Ambulatory Visit (INDEPENDENT_AMBULATORY_CARE_PROVIDER_SITE_OTHER): Payer: Medicare Other | Admitting: Pharmacotherapy

## 2016-04-04 VITALS — BP 124/68 | HR 72 | Resp 14 | Ht 67.0 in | Wt 273.0 lb

## 2016-04-04 DIAGNOSIS — E1142 Type 2 diabetes mellitus with diabetic polyneuropathy: Secondary | ICD-10-CM

## 2016-04-04 DIAGNOSIS — I10 Essential (primary) hypertension: Secondary | ICD-10-CM | POA: Diagnosis not present

## 2016-04-04 DIAGNOSIS — Z794 Long term (current) use of insulin: Secondary | ICD-10-CM | POA: Diagnosis not present

## 2016-04-04 MED ORDER — INSULIN LISPRO 200 UNIT/ML ~~LOC~~ SOPN: 18.0000 [IU] | PEN_INJECTOR | Freq: Three times a day (TID) | SUBCUTANEOUS | 0 refills | Status: DC

## 2016-04-04 NOTE — Patient Instructions (Signed)
Decrease Humalog to 18 units with each meal.

## 2016-04-04 NOTE — Progress Notes (Signed)
  Subjective:    Katherine Delgado is a 75 y.o.white female who presents for follow-up of Type 2 diabetes mellitus.   A1C now 6.6% (was 7.3%) Weight is up 9 pounds.  Plans to have a vacation over Labor Day.  She says she is having a lot of hypoglycemia.  Overcorrects, and she says this "screws up my diet". Average BG:  112mg /dl (only checked 13 times in 30 days) Her lows are in the afternoon and evening.  Has peripheral edema. Has not started methotrexate yet.  Denies problems with vision.  Cannot afford eye exam at this time. Has seen podiatrist for callus and blood blisters on toe.  Getting better. Nocturia 2-3 times per night. No dysuria. Needs more water.  Trying to make healthy food choices. No routine exercise.  Says she has trouble walking and standing.  Review of Systems A comprehensive review of systems was negative except for: Eyes: positive for visual disturbance Cardiovascular: positive for lower extremity edema Genitourinary: positive for nocturia Musculoskeletal: positive for arthralgias and muscle weakness Endocrine: positive for diabetic symptoms including increased fatigue, poor wound healing and skin dryness    Objective:    BP 124/68   Pulse 72   Resp 14   Ht 5\' 7"  (1.702 m)   Wt 273 lb (123.8 kg)   BMI 42.76 kg/m   General:  alert, cooperative and no distress  Oropharynx: normal findings: lips normal without lesions and gums healthy   Eyes:  negative findings: lids and lashes normal and conjunctivae and sclerae normal   Ears:  external ears normal        Lung: clear to auscultation bilaterally  Heart:  regular rate and rhythm     Extremities: edema bilateral lower extremities  Skin: dry     Neuro: mental status, speech normal, alert and oriented x3 and reflexes normal and symmetric   Lab Review Glucose (mg/dL)  Date Value  12/28/2015 113 (H)  09/25/2015 54 (L)  07/28/2015 116 (H)   Glucose, Bld (mg/dL)  Date Value  04/01/2016 63 (L)   01/08/2016 138 (H)  11/03/2014 148 (H)   CO2 (mmol/L)  Date Value  04/01/2016 28  01/08/2016 29  12/28/2015 27   BUN (mg/dL)  Date Value  04/01/2016 22  01/08/2016 23 (H)  12/28/2015 24  09/25/2015 20  07/28/2015 20   Creat (mg/dL)  Date Value  04/01/2016 0.82   Creatinine, Ser (mg/dL)  Date Value  01/08/2016 0.88  12/28/2015 1.05 (H)  09/25/2015 1.05 (H)       Assessment:    Diabetes Mellitus type II, under excellent control.   A1C at goal <7%; however, having hypoglycemia. BP at goal <140/90   Plan:    1.  Rx changes: decrease Humalog 18 units with meals to prevent hypoglycemia.  2.  Continue Toujeo 90 units daily. 3.  Continue Victoza 1.8mg  daily. 4.  Counseled on nutrition goals. 5.  Counseled on need for routine exercise.  Goal is 30-45 minutes 5 x week. 6.  BP at goal <140/90

## 2016-04-05 ENCOUNTER — Other Ambulatory Visit: Payer: Self-pay | Admitting: Internal Medicine

## 2016-04-08 ENCOUNTER — Other Ambulatory Visit: Payer: Self-pay | Admitting: *Deleted

## 2016-04-08 ENCOUNTER — Encounter: Payer: Self-pay | Admitting: *Deleted

## 2016-04-08 MED ORDER — LEVOTHYROXINE SODIUM 137 MCG PO TABS: 137.0000 ug | ORAL_TABLET | Freq: Every day | ORAL | 0 refills | Status: DC

## 2016-04-11 ENCOUNTER — Other Ambulatory Visit: Payer: Self-pay | Admitting: Pharmacotherapy

## 2016-04-11 DIAGNOSIS — E1142 Type 2 diabetes mellitus with diabetic polyneuropathy: Secondary | ICD-10-CM

## 2016-04-19 ENCOUNTER — Encounter: Payer: Self-pay | Admitting: Neurology

## 2016-04-19 ENCOUNTER — Ambulatory Visit (INDEPENDENT_AMBULATORY_CARE_PROVIDER_SITE_OTHER): Payer: Medicare Other | Admitting: Neurology

## 2016-04-19 VITALS — BP 174/80 | HR 82 | Ht 67.0 in | Wt 264.0 lb

## 2016-04-19 DIAGNOSIS — E1142 Type 2 diabetes mellitus with diabetic polyneuropathy: Secondary | ICD-10-CM | POA: Diagnosis not present

## 2016-04-19 DIAGNOSIS — M6281 Muscle weakness (generalized): Secondary | ICD-10-CM

## 2016-04-19 DIAGNOSIS — M545 Low back pain: Secondary | ICD-10-CM

## 2016-04-19 DIAGNOSIS — Z794 Long term (current) use of insulin: Secondary | ICD-10-CM

## 2016-04-19 DIAGNOSIS — R269 Unspecified abnormalities of gait and mobility: Secondary | ICD-10-CM

## 2016-04-19 DIAGNOSIS — G8929 Other chronic pain: Secondary | ICD-10-CM

## 2016-04-19 HISTORY — DX: Unspecified abnormalities of gait and mobility: R26.9

## 2016-04-19 NOTE — Progress Notes (Signed)
Reason for visit:  Peripheral neuropathy  Katherine Delgado is a 75 y.o. female  History of present illness:   Katherine Delgado is a 75 year old right-handed white female with a history of diabetes, obesity, and a diabetic peripheral neuropathy. In 2002 she had Guillain-Barr syndrome. She has had a good recovery from this. She has a history of chronic low back pain and she has had epidural steroid injections in the back previously, she has a history of lumbar spine surgery as well. The patient comes to the office today indicating a relatively sudden onset of total body pain that occurred associated with a sensation of weakness and fatigue and difficulty with walking. The patient has had improvement in the pain that at one time included the body, arms, legs, and head and neck. The patient denies any falls, but she still has some ongoing fatigue when trying to walk. She does have a history of fibromyalgia as well. She denies any issues controlling the bowels or the bladder. She reports a history of rheumatoid arthritis. Her diabetes is under relatively good control, her hemoglobin A1c has been 6.6. She comes back to the office today for reevaluation.  Past Medical History:  Diagnosis Date  . Abnormal weight gain   . Acute infective polyneuritis ([place]) 2002  . Acute maxillary sinusitis   . Acute sinusitis, unspecified   . Allergic rhinitis due to pollen   . Arthritis   . Back injury   . Candidiasis of skin and nails   . Candidiasis of vulva and vagina   . Chronic pain syndrome   . COPD (chronic obstructive pulmonary disease) ([place])   . Cough   . Degenerative arthritis   . Depressive disorder, not elsewhere classified   . Diabetes mellitus without complication ([place])   . Diaphragmatic hernia without mention of obstruction or gangrene   . Dyslipidemia   . Edema   . Fibromyalgia   . GERD (gastroesophageal reflux disease)   . Guillain-Barre syndrome ([place])   . History of benign thymus tumor   .  Hypertension   . Insomnia, unspecified   . Lumbago   . Miscarriage 1962  . Mixed hyperlipidemia   . Morbid obesity ([place])   . Obstructive chronic bronchitis with acute bronchitis ([place])   . Osteopenia, senile   . Osteoporosis   . Other malaise and fatigue   . Other specified disease of white blood cells   . Pain in joint, multiple sites   . Polyneuropathy in diabetes(357.2)   . RA (rheumatoid arthritis) ([place])   . Reflux esophagitis   . Rheumatoid arthritis(714.0)   . Shortness of breath   . Spinal stenosis, lumbar region, without neurogenic claudication   . Spondylosis, lumbosacral   . Spontaneous ecchymoses   . Stiffness of joints, not elsewhere classified, multiple sites   . Tear film insufficiency, unspecified   . Thyroid disease   . Thyroid disorder   . Type II or unspecified type diabetes mellitus with peripheral circulatory disorders, uncontrolled(250.72)   . Unspecified chronic bronchitis ([place])   . Unspecified essential hypertension   . Unspecified hypothyroidism   . Unspecified pruritic disorder   . Unspecified urinary incontinence   . Urinary tract infection, site not specified     Past Surgical History:  Procedure Laterality Date  . ABDOMINAL HYSTERECTOMY  1974  . abdominal tumor  2002  . APPENDECTOMY    . [place]  . BRONCHOSCOPY  2001  . CHOLECYSTECTOMY  1984  .  KNEE SURGERY Bilateral 08/27/2010 (L) and 01/11/2011 (R)  . [place]  . OVARIAN CYST SURGERY  1968  . thymus tumor    . 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 TONSILLECTOMY      Family History  Problem Relation Age of Onset  . Alzheimer's disease Mother   . Heart disease Mother   . Heart disease Father   . Liver disease Father   . Breast cancer Sister   . Colon polyps Brother   . Colon cancer Neg Hx   . Esophageal cancer Neg Hx   . Kidney disease Neg Hx   . Stomach cancer Neg Hx   . Rectal cancer Neg Hx     Social history:  reports that she quit smoking about 36 years ago. Her  smoking use included Cigarettes. She has never used smokeless tobacco. She reports that she does not drink alcohol or use drugs.  Medications:  Prior to Admission medications   Medication Sig Start Date End Date Taking? Authorizing Provider  amLODipine (NORVASC) 10 MG tablet TAKE 1 TABLET(10 MG) BY MOUTH DAILY 12/15/15  Yes Tiffany L Reed, DO  aspirin EC 81 MG tablet Take 81 mg by mouth daily as needed.    Yes Historical Provider, MD  Cholecalciferol (VITAMIN D) 2000 units tablet Take 2,000 Units by mouth daily.   Yes Historical Provider, MD  furosemide (LASIX) 20 MG tablet Take 1 tablet (20 mg total) by mouth 2 (two) times daily as needed. Use as needed for 3 pounds gain in one day or 5 pounds in one week 12/28/15  Yes Tiffany L Reed, DO  gabapentin (NEURONTIN) 600 MG tablet TAKE 1 TABLET BY MOUTH THREE TIMES DAILY 01/27/16  Yes Tiffany L Reed, DO  glucose blood (ACCU-CHEK SMARTVIEW) test strip Check blood sugar 3-4 times daily   Yes Historical Provider, MD  HYDROcodone-acetaminophen (NORCO) 10-325 MG tablet Take one tablet every three hours as needed for pain 03/24/16  Yes Tiffany L Reed, DO  Insulin Glargine (TOUJEO SOLOSTAR) 300 UNIT/ML SOPN Inject 90 Units into the skin at bedtime.    Yes Historical Provider, MD  Insulin Lispro (HUMALOG KWIKPEN) 200 UNIT/ML SOPN Inject 18 Units into the skin 3 (three) times daily before meals. 04/04/16 05/04/16 Yes Cathey Miller, RPH-CPP  Insulin Pen Needle (PEN NEEDLES) 31G X 5 MM MISC 1 each by Does not apply route as directed. 12/16/15  Yes Tiffany L Reed, DO  Insulin Syringe-Needle U-100 31G X 5/16" 1 ML MISC Use once daily with administration of Insulin DX E11.59 08/04/14  Yes Cathey Miller, RPH-CPP  levothyroxine (SYNTHROID, LEVOTHROID) 137 MCG tablet Take 1 tablet (137 mcg total) by mouth daily before breakfast. 04/08/16  Yes Tiffany L Reed, DO  lidocaine (LIDODERM) 5 % Apply patch to bilateral knees at bedtime.  Remove & Discard patch within 12 hours or as  directed by MD 02/20/15  Yes Tiffany L Reed, DO  lisinopril (PRINIVIL,ZESTRIL) 10 MG tablet TAKE 1 TABLET(10 MG) BY MOUTH TWICE DAILY 04/05/16  Yes Estill Dooms, MD  loperamide (IMODIUM A-D) 2 MG tablet Take 2 mg by mouth 4 (four) times daily as needed for diarrhea or loose stools.   Yes Historical Provider, MD  Melatonin 10 MG CAPS Take 1 capsule by mouth at bedtime   Yes Historical Provider, MD  metoprolol tartrate (LOPRESSOR) 25 MG tablet TAKE 1 TABLET(25 MG) BY MOUTH TWICE DAILY 10/27/15  Yes Tiffany L Reed, DO  nystatin (MYCOSTATIN/NYSTOP) 100000 UNIT/GM POWD APPLY 1 GRAM TOPICALLY  TO THE AFFECTED AREA DAILY AS NEEDED FOR ITCHING 11/24/15  Yes Tiffany L Reed, DO  nystatin-triamcinolone (MYCOLOG II) cream Apply 1 application topically 4 (four) times daily as needed (for rash). Apply under abdomen folds twice daily until rash healed 12/23/14  Yes Tiffany L Reed, DO  omega-3 acid ethyl esters (LOVAZA) 1 G capsule TAKE ONE CAPSULE BY MOUTH EVERY DAY 07/24/15  Yes Tiffany L Reed, DO  oxyCODONE (OXY IR/ROXICODONE) 5 MG immediate release tablet Take one tablet every 6 hours as needed for severe pain, Take 2 tablets at bedtime. 03/24/16  Yes Tiffany L Reed, DO  pilocarpine (SALAGEN) 5 MG tablet Take 5 mg by mouth daily. For dry mouth 09/24/13  Yes Historical Provider, MD  polyvinyl alcohol (LIQUIFILM TEARS) 1.4 % ophthalmic solution Place 1 drop into both eyes as needed (for dry eyes).    Yes Historical Provider, MD  potassium chloride (K-DUR) 10 MEQ tablet Take 10 mEq by mouth 2 (two) times daily as needed (Only when taking Lasix).   Yes Historical Provider, MD  promethazine (PHENERGAN) 12.5 MG tablet TAKE 1 TABLET BY MOUTH AS NEEDED FOR SEVERE NAUSEA AS DIRECTED 07/06/15  Yes Tiffany L Reed, DO  SPIRIVA HANDIHALER 18 MCG inhalation capsule INHALE THE CONTENTS OF 1 CAPSULE VIA HANDIHALER BY MOUTH DAILY FOR BREATHING 11/24/15  Yes Tiffany L Reed, DO  TOUJEO SOLOSTAR 300 UNIT/ML SOPN INJECT 70 UNITS INTO THE SKIN DAILY  04/11/16  Yes Tiffany L Reed, DO  triamterene-hydrochlorothiazide (MAXZIDE-25) 37.5-25 MG tablet GIVE "[place]" 1 TABLET BY MOUTH DAILY FOR BLOOD PRESSURE 03/11/16  Yes Tiffany L Reed, DO  venlafaxine XR (EFFEXOR-XR) 37.5 MG 24 hr capsule TAKE 1 CAPSULE(37.5 MG) BY MOUTH DAILY WITH BREAKFAST 12/31/15  Yes Tiffany L Reed, DO  VICTOZA 18 MG/3ML SOPN ADMINISTER 1.8 MG UNDER THE SKIN DAILY FOR BLOOD SUGAR 03/21/16  Yes Tiffany L Reed, DO  XELJANZ 5 MG TABS Take 5 mg by mouth 2 (two) times daily. For Rheumatoid Arthritis 03/28/13  Yes Historical Provider, MD      Allergies  Allergen Reactions  . Penicillins Hives, Itching, Swelling and Rash  . Sulfamethoxazole-Trimethoprim Itching    ROS:  Out of a complete 14 system review of symptoms, the patient complains only of the following symptoms, and all other reviewed systems are negative.   Weight gain , fatigue  Palpitations of the heart, swelling in the legs  Shortness of breath  Incontinence of urine  Easy bruising  Increased thirst, flushing  Joint pain, joint swelling, aching muscles  Numbness, weakness  Not enough sleep, decreased energy  Insomnia, restless legs  Blood pressure (!) 174/80, pulse 82, height 5\' 7"  (1.702 m), weight 264 lb (119.7 kg).  Physical Exam  General: The patient is alert and cooperative at the time of the examination. The patient is markedly obese.  Eyes: Pupils are equal, round, and reactive to light. Discs are flat bilaterally.  Neck: The neck is supple, no carotid bruits are noted.  Respiratory: The respiratory examination is clear.  Cardiovascular: The cardiovascular examination reveals a regular rate and rhythm, no obvious murmurs or rubs are noted.  Skin: Extremities are with 1+ edema of ankles, left greater than right.  Neurologic Exam  Mental status: The patient is alert and oriented x 3 at the time of the examination. The patient has apparent normal recent and remote memory, with an apparently normal  attention span and concentration ability.  Cranial nerves: Facial symmetry is not present. There is some depression of  muscle contraction with the forehead on the left. There is good sensation of the face to pinprick and soft touch bilaterally. The strength of the facial muscles and the muscles to head turning and shoulder shrug are normal bilaterally. Speech is well enunciated, no aphasia or dysarthria is noted. Extraocular movements are full. Visual fields are full. The tongue is midline, and the patient has symmetric elevation of the soft palate. No obvious hearing deficits are noted.  Motor: The motor testing reveals 5 over 5 strength of all 4 extremities. Good symmetric motor tone is noted throughout.  Sensory: Sensory testing is intact to pinprick, soft touch, vibration sensation, and position sense on all 4 extremities , with exception of some decrease in vibration sensation on the left foot and evidence of a stocking pattern pinprick sensation deficit up to the knee on the left, not present on the right. No evidence of extinction is noted.  Coordination: Cerebellar testing reveals good finger-nose-finger and heel-to-shin bilaterally.  Gait and station: Gait is  Slightly wide-based. Tandem gait is  unsteady. Romberg is negative. No drift is seen.  Reflexes: Deep tendon reflexes are symmetric and normal bilaterally , with exception of depression of ankle jerk reflexes bilaterally. Toes are downgoing bilaterally.   Assessment/Plan:   1. Diabetic peripheral neuropathy   2. New onset generalized fatigue , gait disorder   The patient does have a prior history of  Guillain-Barr syndrome, but she likely does not have this issue currently as she still has retained reflexes. The patient does have a known peripheral neuropathy, we will check blood work today and set her up for EMG and nerve conduction study to compare to a prior study done in 2014. She will follow-up for the EMG.  Jill Alexanders MD 04/19/2016 10:05 AM  [facility] [address] [place], [place] [address]  Phone [phone] Fax [phone]

## 2016-04-20 ENCOUNTER — Other Ambulatory Visit: Payer: Self-pay | Admitting: Internal Medicine

## 2016-04-21 ENCOUNTER — Telehealth: Payer: Self-pay | Admitting: Neurology

## 2016-04-21 LAB — MULTIPLE MYELOMA PANEL, SERUM
ALBUMIN SERPL ELPH-MCNC: 3.7 g/dL (ref 2.9–4.4)
Albumin/Glob SerPl: 1.1 (ref 0.7–1.7)
Alpha 1: 0.3 g/dL (ref 0.0–0.4)
Alpha2 Glob SerPl Elph-Mcnc: 0.9 g/dL (ref 0.4–1.0)
B-Globulin SerPl Elph-Mcnc: 1.5 g/dL — ABNORMAL HIGH (ref 0.7–1.3)
GAMMA GLOB SERPL ELPH-MCNC: 1 g/dL (ref 0.4–1.8)
GLOBULIN, TOTAL: 3.7 g/dL (ref 2.2–3.9)
IGA/IMMUNOGLOBULIN A, SERUM: 414 mg/dL (ref 64–422)
IgG (Immunoglobin G), Serum: 923 mg/dL (ref 700–1600)
IgM (Immunoglobulin M), Srm: 61 mg/dL (ref 26–217)
Total Protein: 7.4 g/dL (ref 6.0–8.5)

## 2016-04-21 LAB — B. BURGDORFI ANTIBODIES: Lyme IgG/IgM Ab: <0.91 {ISR} (ref 0.00–0.90)

## 2016-04-21 LAB — ENA+DNA/DS+SJORGEN'S
ENA SM Ab Ser-aCnc: <0.2 AI (ref 0.0–0.9)
ENA SSA (RO) Ab: 1.5 AI — ABNORMAL HIGH (ref 0.0–0.9)
dsDNA Ab: 1 IU/mL (ref 0–9)

## 2016-04-21 LAB — ANA W/REFLEX: Anti Nuclear Antibody(ANA): POSITIVE — AB

## 2016-04-21 LAB — ANGIOTENSIN CONVERTING ENZYME: Angio Convert Enzyme: <15 U/L (ref 14–82)

## 2016-04-21 LAB — CK: CK TOTAL: 295 U/L — AB (ref 24–173)

## 2016-04-21 LAB — SEDIMENTATION RATE: SED RATE: 15 mm/h (ref 0–40)

## 2016-04-21 LAB — ACETYLCHOLINE RECEPTOR, BINDING: AChR Binding Ab, Serum: <0.03 nmol/L (ref 0.00–0.24)

## 2016-04-21 NOTE — Telephone Encounter (Signed)
I called patient. The blood work shows a slight elevation CK enzyme level, ANA is positive, but only the SSA antibody is positive on the panel in low titer. This may not be clinically significant. The patient is to come in for EMG nerve conduction study to evaluate for a peripheral neuropathy.

## 2016-04-22 ENCOUNTER — Other Ambulatory Visit: Payer: Self-pay | Admitting: *Deleted

## 2016-04-22 DIAGNOSIS — G9332 Myalgic encephalomyelitis/chronic fatigue syndrome: Secondary | ICD-10-CM

## 2016-04-22 DIAGNOSIS — R5382 Chronic fatigue, unspecified: Secondary | ICD-10-CM

## 2016-04-22 DIAGNOSIS — G8929 Other chronic pain: Secondary | ICD-10-CM

## 2016-04-22 DIAGNOSIS — M545 Low back pain: Principal | ICD-10-CM

## 2016-04-22 DIAGNOSIS — M797 Fibromyalgia: Secondary | ICD-10-CM

## 2016-04-22 MED ORDER — OXYCODONE HCL 5 MG PO TABS: ORAL_TABLET | ORAL | 0 refills | Status: DC

## 2016-04-22 MED ORDER — HYDROCODONE-ACETAMINOPHEN 10-325 MG PO TABS: ORAL_TABLET | ORAL | 0 refills | Status: DC

## 2016-04-22 NOTE — Telephone Encounter (Signed)
Patient requested and will pick up 

## 2016-04-22 NOTE — Telephone Encounter (Signed)
Spoke with patient and advised rx ready for pick-up and it will be at the front desk.  

## 2016-04-27 ENCOUNTER — Other Ambulatory Visit: Payer: Self-pay | Admitting: Internal Medicine

## 2016-05-09 ENCOUNTER — Other Ambulatory Visit: Payer: Self-pay | Admitting: Internal Medicine

## 2016-05-09 DIAGNOSIS — R7989 Other specified abnormal findings of blood chemistry: Secondary | ICD-10-CM

## 2016-05-17 ENCOUNTER — Other Ambulatory Visit: Payer: Self-pay | Admitting: *Deleted

## 2016-05-17 MED ORDER — NYSTATIN-TRIAMCINOLONE 100000-0.1 UNIT/GM-% EX CREA: 1.0000 "application " | TOPICAL_CREAM | Freq: Four times a day (QID) | CUTANEOUS | 5 refills | Status: DC | PRN

## 2016-05-17 NOTE — Telephone Encounter (Signed)
Patient requested 

## 2016-05-19 ENCOUNTER — Ambulatory Visit: Payer: Medicare Other | Admitting: Podiatry

## 2016-05-19 ENCOUNTER — Telehealth: Payer: Self-pay | Admitting: *Deleted

## 2016-05-19 NOTE — Telephone Encounter (Signed)
Received fax from [facility] [phone] opt2. Fax: [phone] for therapeutic footwear. Given to Dr. Mariea Clonts to review and sign.

## 2016-05-23 ENCOUNTER — Other Ambulatory Visit: Payer: Self-pay | Admitting: *Deleted

## 2016-05-23 DIAGNOSIS — M797 Fibromyalgia: Secondary | ICD-10-CM

## 2016-05-23 DIAGNOSIS — R5382 Chronic fatigue, unspecified: Secondary | ICD-10-CM

## 2016-05-23 DIAGNOSIS — G8929 Other chronic pain: Secondary | ICD-10-CM

## 2016-05-23 DIAGNOSIS — M545 Low back pain: Principal | ICD-10-CM

## 2016-05-23 DIAGNOSIS — G9332 Myalgic encephalomyelitis/chronic fatigue syndrome: Secondary | ICD-10-CM

## 2016-05-23 MED ORDER — OXYCODONE HCL 5 MG PO TABS: ORAL_TABLET | ORAL | 0 refills | Status: DC

## 2016-05-23 MED ORDER — HYDROCODONE-ACETAMINOPHEN 10-325 MG PO TABS: ORAL_TABLET | ORAL | 0 refills | Status: DC

## 2016-05-23 NOTE — Telephone Encounter (Signed)
Patient requested and will pick up 

## 2016-05-25 ENCOUNTER — Other Ambulatory Visit: Payer: Self-pay | Admitting: Internal Medicine

## 2016-05-28 ENCOUNTER — Encounter (HOSPITAL_COMMUNITY): Payer: Self-pay

## 2016-05-28 ENCOUNTER — Emergency Department (HOSPITAL_COMMUNITY)
Admission: EM | Admit: 2016-05-28 | Discharge: 2016-05-28 | Disposition: A | Discharge status: Home / Self Care | Payer: Medicare Other | Attending: Emergency Medicine | Admitting: Emergency Medicine

## 2016-05-28 ENCOUNTER — Emergency Department (HOSPITAL_COMMUNITY): Payer: Medicare Other

## 2016-05-28 DIAGNOSIS — L02416 Cutaneous abscess of left lower limb: Secondary | ICD-10-CM | POA: Diagnosis not present

## 2016-05-28 DIAGNOSIS — Z87891 Personal history of nicotine dependence: Secondary | ICD-10-CM | POA: Insufficient documentation

## 2016-05-28 DIAGNOSIS — Z7982 Long term (current) use of aspirin: Secondary | ICD-10-CM | POA: Insufficient documentation

## 2016-05-28 DIAGNOSIS — Z79899 Other long term (current) drug therapy: Secondary | ICD-10-CM | POA: Diagnosis not present

## 2016-05-28 DIAGNOSIS — E1140 Type 2 diabetes mellitus with diabetic neuropathy, unspecified: Secondary | ICD-10-CM | POA: Insufficient documentation

## 2016-05-28 DIAGNOSIS — L03032 Cellulitis of left toe: Secondary | ICD-10-CM

## 2016-05-28 DIAGNOSIS — M7989 Other specified soft tissue disorders: Secondary | ICD-10-CM | POA: Diagnosis not present

## 2016-05-28 DIAGNOSIS — M25572 Pain in left ankle and joints of left foot: Secondary | ICD-10-CM | POA: Diagnosis not present

## 2016-05-28 DIAGNOSIS — L02612 Cutaneous abscess of left foot: Secondary | ICD-10-CM | POA: Insufficient documentation

## 2016-05-28 DIAGNOSIS — J449 Chronic obstructive pulmonary disease, unspecified: Secondary | ICD-10-CM | POA: Insufficient documentation

## 2016-05-28 DIAGNOSIS — Z794 Long term (current) use of insulin: Secondary | ICD-10-CM | POA: Insufficient documentation

## 2016-05-28 DIAGNOSIS — Z23 Encounter for immunization: Secondary | ICD-10-CM | POA: Diagnosis not present

## 2016-05-28 DIAGNOSIS — L0291 Cutaneous abscess, unspecified: Secondary | ICD-10-CM

## 2016-05-28 DIAGNOSIS — I10 Essential (primary) hypertension: Secondary | ICD-10-CM | POA: Insufficient documentation

## 2016-05-28 DIAGNOSIS — M545 Low back pain: Secondary | ICD-10-CM

## 2016-05-28 DIAGNOSIS — E039 Hypothyroidism, unspecified: Secondary | ICD-10-CM | POA: Diagnosis not present

## 2016-05-28 DIAGNOSIS — G8929 Other chronic pain: Secondary | ICD-10-CM

## 2016-05-28 LAB — CBC WITH DIFFERENTIAL/PLATELET
BASOS ABS: 0 10*3/uL (ref 0.0–0.1)
BASOS PCT: 0 %
EOS PCT: 2 %
Eosinophils Absolute: 0.3 10*3/uL (ref 0.0–0.7)
HCT: 42.7 % (ref 36.0–46.0)
Hemoglobin: 13.6 g/dL (ref 12.0–15.0)
Lymphocytes Relative: 15 %
Lymphs Abs: 2.2 10*3/uL (ref 0.7–4.0)
MCH: 27.4 pg (ref 26.0–34.0)
MCHC: 31.9 g/dL (ref 30.0–36.0)
MCV: 86.1 fL (ref 78.0–100.0)
MONO ABS: 1.1 10*3/uL — AB (ref 0.1–1.0)
Monocytes Relative: 7 %
Neutro Abs: 11.3 10*3/uL — ABNORMAL HIGH (ref 1.7–7.7)
Neutrophils Relative %: 76 %
PLATELETS: 245 10*3/uL (ref 150–400)
RBC: 4.96 MIL/uL (ref 3.87–5.11)
RDW: 14.8 % (ref 11.5–15.5)
WBC: 14.9 10*3/uL — ABNORMAL HIGH (ref 4.0–10.5)

## 2016-05-28 LAB — BASIC METABOLIC PANEL
ANION GAP: 10 (ref 5–15)
BUN: 16 mg/dL (ref 6–20)
CALCIUM: 9 mg/dL (ref 8.9–10.3)
CO2: 29 mmol/L (ref 22–32)
Chloride: 101 mmol/L (ref 101–111)
Creatinine, Ser: 0.8 mg/dL (ref 0.44–1.00)
GLUCOSE: 111 mg/dL — AB (ref 65–99)
Potassium: 3.5 mmol/L (ref 3.5–5.1)
Sodium: 140 mmol/L (ref 135–145)

## 2016-05-28 LAB — CBG MONITORING, ED: GLUCOSE-CAPILLARY: 107 mg/dL — AB (ref 65–99)

## 2016-05-28 MED ORDER — DOXYCYCLINE HYCLATE 100 MG PO CAPS: 100.0000 mg | ORAL_CAPSULE | Freq: Two times a day (BID) | ORAL | 0 refills | Status: DC

## 2016-05-28 MED ORDER — DEXTROSE 5 % IV SOLN
1.0000 g | Freq: Once | INTRAVENOUS | Status: AC
  Administered 2016-05-28: 1 g via INTRAVENOUS
  Filled 2016-05-28: qty 10

## 2016-05-28 MED ORDER — HYDROCODONE-ACETAMINOPHEN 10-325 MG PO TABS: ORAL_TABLET | ORAL | 0 refills | Status: DC

## 2016-05-28 MED ORDER — TETANUS-DIPHTH-ACELL PERTUSSIS 5-2.5-18.5 LF-MCG/0.5 IM SUSP
0.5000 mL | Freq: Once | INTRAMUSCULAR | Status: AC
  Administered 2016-05-28: 0.5 mL via INTRAMUSCULAR
  Filled 2016-05-28: qty 0.5

## 2016-05-28 MED ORDER — HYDROCODONE-ACETAMINOPHEN 5-325 MG PO TABS: 1.0000 | ORAL_TABLET | Freq: Four times a day (QID) | ORAL | 0 refills | Status: DC | PRN

## 2016-05-28 NOTE — Discharge Instructions (Signed)
Take Tylenol for mild pain or the pain medicine prescribed for bad pain. Don't take Tylenol together with the pain medicine prescribed as the combination can be dangerous to your liver. Elevate your left leg above your heart as much as possible. Call Dr.Duda's office on Monday morning, 05/30/2016 to arrange to be seen that day. Tell office staff that [facility] [place] spoke with Dr. Sharol Given and you should be worked in to the office schedulethat day.

## 2016-05-28 NOTE — ED Provider Notes (Signed)
[facility] DEPT Provider Note   CSN: [id number] Arrival date & time: 05/28/16  1220     History   Chief Complaint Chief Complaint  Patient presents with  . Foot Swelling    LEFT    HPI Katherine Delgado is a 75 y.o. female.Presents with painful great toe and swelling of left foot which started 2 days ago. She states she had an area at the plantar surface for great toe which drained pus. Left foot and distal left leg has been red and swollen for the past 3 days. Pain is worse with weightbearing causing her to walk on her heel. She denies fever denies vomiting. No treatment prior to coming here. No other associated symptoms  HPI  Past Medical History:  Diagnosis Date  . Abnormal weight gain   . Abnormality of gait 04/19/2016  . Acute infective polyneuritis ([place]) 2002  . Acute maxillary sinusitis   . Acute sinusitis, unspecified   . Allergic rhinitis due to pollen   . Arthritis   . Back injury   . Candidiasis of skin and nails   . Candidiasis of vulva and vagina   . Chronic pain syndrome   . COPD (chronic obstructive pulmonary disease) ([place])   . Cough   . Degenerative arthritis   . Depressive disorder, not elsewhere classified   . Diabetes mellitus without complication ([place])   . Diaphragmatic hernia without mention of obstruction or gangrene   . Dyslipidemia   . Edema   . Fibromyalgia   . GERD (gastroesophageal reflux disease)   . Guillain-Barre syndrome ([place])   . History of benign thymus tumor   . Hypertension   . Insomnia, unspecified   . Lumbago   . Miscarriage 1962  . Mixed hyperlipidemia   . Morbid obesity ([place])   . Obstructive chronic bronchitis with acute bronchitis ([place])   . Osteopenia, senile   . Osteoporosis   . Other malaise and fatigue   . Other specified disease of white blood cells   . Pain in joint, multiple sites   . Polyneuropathy in diabetes(357.2)   . RA (rheumatoid arthritis) ([place])   . Reflux esophagitis   . Rheumatoid arthritis(714.0)   .  Shortness of breath   . Spinal stenosis, lumbar region, without neurogenic claudication   . Spondylosis, lumbosacral   . Spontaneous ecchymoses   . Stiffness of joints, not elsewhere classified, multiple sites   . Tear film insufficiency, unspecified   . Thyroid disease   . Thyroid disorder   . Type II or unspecified type diabetes mellitus with peripheral circulatory disorders, uncontrolled(250.72)   . Unspecified chronic bronchitis   . Unspecified essential hypertension   . Unspecified hypothyroidism   . Unspecified pruritic disorder   . Unspecified urinary incontinence   . Urinary tract infection, site not specified     Patient Active Problem List   Diagnosis Date Noted  . Abnormality of gait 04/19/2016  . Type 2 diabetes mellitus with diabetic polyneuropathy, with long-term current use of insulin ([place]) 11/27/2015  . Chronic fatigue fibromyalgia syndrome 11/27/2015  . Severe obesity (BMI >= 40) ([place]) 04/21/2014  . Type II or unspecified type diabetes mellitus with peripheral circulatory disorders, uncontrolled(250.72) 09/12/2013  . Osteopenia, senile   . Headache(784.0) 07/31/2013  . Sinus infection 07/31/2013  . Chronic low back pain 03/14/2013  . Insomnia 12/06/2012  . Nausea alone 12/06/2012  . Diarrhea 12/06/2012  . Rheumatoid arthritis(714.0) 12/06/2012  . Urinary incontinence, urge 12/06/2012  . Lumbosacral root lesions, not  elsewhere classified 11/27/2012  . Polyneuropathy in other diseases classified elsewhere ([place]) 11/27/2012  . EDEMA 05/04/2010  . YEAST INFECTION 05/03/2010  . DIABETES MELLITUS 05/03/2010  . Hyperlipidemia 05/03/2010  . OBESITY 05/03/2010  . DEPRESSION 05/03/2010  . PERIPHERAL NEUROPATHY 05/03/2010  . GUILLAIN-BARRE SYNDROME 05/03/2010  . HYPERTENSION 05/03/2010  . ALLERGIC RHINITIS 05/03/2010  . PNEUMONIA 05/03/2010  . COPD 05/03/2010  . GERD 05/03/2010  . FIBROMYALGIA 05/03/2010  . DYSPNEA 05/03/2010  . CHEST PAIN 05/03/2010    Past  Surgical History:  Procedure Laterality Date  . ABDOMINAL HYSTERECTOMY  1974  . abdominal tumor  2002  . APPENDECTOMY    . [place]  . BRONCHOSCOPY  2001  . CHOLECYSTECTOMY  1984  . KNEE SURGERY Bilateral 08/27/2010 (L) and 01/11/2011 (R)  . [place]  . OVARIAN CYST SURGERY  1968  . thymus tumor    . 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 TONSILLECTOMY      OB History    No data available       Home Medications    Prior to Admission medications   Medication Sig Start Date End Date Taking? Authorizing Provider  amLODipine (NORVASC) 10 MG tablet TAKE 1 TABLET(10 MG) BY MOUTH DAILY 12/15/15   Tiffany L Reed, DO  aspirin EC 81 MG tablet Take 81 mg by mouth daily as needed.     Historical Provider, MD  Cholecalciferol (VITAMIN D) 2000 units tablet Take 2,000 Units by mouth daily.    Historical Provider, MD  furosemide (LASIX) 20 MG tablet TAKE 1 TABLET BY MOUTH TWICE DAILY AS NEEDED FOR 3 POUND WEIGHT GAIN IN ONE DAY OR 5 POUNDS IN ONE WEEK 04/27/16   Tiffany L Reed, DO  gabapentin (NEURONTIN) 600 MG tablet TAKE 1 TABLET BY MOUTH THREE TIMES DAILY 01/27/16   Tiffany L Reed, DO  glucose blood (ACCU-CHEK SMARTVIEW) test strip Check blood sugar 3-4 times daily    Historical Provider, MD  HYDROcodone-acetaminophen (NORCO) 10-325 MG tablet Take one tablet every three hours as needed for pain 05/23/16   Tiffany L Reed, DO  Insulin Glargine (TOUJEO SOLOSTAR) 300 UNIT/ML SOPN Inject 90 Units into the skin at bedtime.     Historical Provider, MD  Insulin Lispro (HUMALOG KWIKPEN) 200 UNIT/ML SOPN Inject 18 Units into the skin 3 (three) times daily before meals. 04/04/16 05/04/16  Cathey Miller, RPH-CPP  Insulin Pen Needle (PEN NEEDLES) 31G X 5 MM MISC 1 each by Does not apply route as directed. 12/16/15   Tiffany L Reed, DO  Insulin Syringe-Needle U-100 31G X 5/16" 1 ML MISC Use once daily with administration of Insulin DX E11.59 08/04/14   Cathey Miller, RPH-CPP  levothyroxine (SYNTHROID,  LEVOTHROID) 137 MCG tablet TAKE 1 TABLET BY MOUTH DAILY BEFORE BREAKFAST ON AN EMPTY STOMACH( LABS OVERDUE) 05/09/16   Tiffany L Reed, DO  lidocaine (LIDODERM) 5 % Apply patch to bilateral knees at bedtime.  Remove & Discard patch within 12 hours or as directed by MD 02/20/15   Gayland Curry, DO  lisinopril (PRINIVIL,ZESTRIL) 10 MG tablet TAKE 1 TABLET(10 MG) BY MOUTH TWICE DAILY 04/05/16   Estill Dooms, MD  loperamide (IMODIUM A-D) 2 MG tablet Take 2 mg by mouth 4 (four) times daily as needed for diarrhea or loose stools.    Historical Provider, MD  Melatonin 10 MG CAPS Take 1 capsule by mouth at bedtime    Historical Provider, MD  metoprolol tartrate (LOPRESSOR) 25 MG tablet TAKE  1 TABLET(25 MG) BY MOUTH TWICE DAILY 05/25/16   Tiffany L Reed, DO  nystatin (MYCOSTATIN/NYSTOP) 100000 UNIT/GM POWD APPLY 1 GRAM TOPICALLY TO THE AFFECTED AREA DAILY AS NEEDED FOR ITCHING 11/24/15   Tiffany L Reed, DO  nystatin-triamcinolone (MYCOLOG II) cream Apply 1 application topically 4 (four) times daily as needed (for rash). Apply under abdomen folds twice daily until rash healed 05/17/16   Tiffany L Reed, DO  omega-3 acid ethyl esters (LOVAZA) 1 G capsule TAKE ONE CAPSULE BY MOUTH EVERY DAY 07/24/15   Tiffany L Reed, DO  oxyCODONE (OXY IR/ROXICODONE) 5 MG immediate release tablet Take one tablet every 6 hours as needed for severe pain, Take 2 tablets at bedtime. 05/23/16   Tiffany L Reed, DO  pilocarpine (SALAGEN) 5 MG tablet Take 5 mg by mouth daily. For dry mouth 09/24/13   Historical Provider, MD  polyvinyl alcohol (LIQUIFILM TEARS) 1.4 % ophthalmic solution Place 1 drop into both eyes as needed (for dry eyes).     Historical Provider, MD  potassium chloride (K-DUR) 10 MEQ tablet Take 10 mEq by mouth 2 (two) times daily as needed (Only when taking Lasix).    Historical Provider, MD  promethazine (PHENERGAN) 12.5 MG tablet TAKE 1 TABLET BY MOUTH AS NEEDED FOR SEVERE NAUSEA AS DIRECTED 07/06/15   Tiffany L Reed, DO  SPIRIVA  HANDIHALER 18 MCG inhalation capsule INHALE THE CONTENTS OF 1 CAPSULE VIA HANDIHALER BY MOUTH DAILY FOR BREATHING 11/24/15   Tiffany L Reed, DO  TOUJEO SOLOSTAR 300 UNIT/ML SOPN INJECT 70 UNITS INTO THE SKIN DAILY 04/11/16   Tiffany L Reed, DO  triamterene-hydrochlorothiazide (MAXZIDE-25) 37.5-25 MG tablet GIVE "[place]" 1 TABLET BY MOUTH DAILY FOR BLOOD PRESSURE 03/11/16   Tiffany L Reed, DO  venlafaxine XR (EFFEXOR-XR) 37.5 MG 24 hr capsule TAKE 1 CAPSULE(37.5 MG) BY MOUTH DAILY WITH BREAKFAST 12/31/15   Tiffany L Reed, DO  VICTOZA 18 MG/3ML SOPN ADMINISTER 1.8 MG UNDER THE SKIN DAILY FOR BLOOD SUGAR 04/20/16   Tiffany L Reed, DO  XELJANZ 5 MG TABS Take 5 mg by mouth 2 (two) times daily. For Rheumatoid Arthritis 03/28/13   Historical Provider, MD    Family History Family History  Problem Relation Age of Onset  . Alzheimer's disease Mother   . Heart disease Mother   . Heart disease Father   . Liver disease Father   . Breast cancer Sister   . Colon polyps Brother   . Colon cancer Neg Hx   . Esophageal cancer Neg Hx   . Kidney disease Neg Hx   . Stomach cancer Neg Hx   . Rectal cancer Neg Hx     Social History Social History  Substance Use Topics  . Smoking status: Former Smoker    Types: Cigarettes    Quit date: 12/07/1979  . Smokeless tobacco: Never Used  . Alcohol use No     Allergies   Penicillins and Sulfamethoxazole-trimethoprim   Review of Systems Review of Systems  Constitutional: Negative.   HENT: Negative.   Respiratory: Negative.   Cardiovascular: Negative.   Gastrointestinal: Negative.   Musculoskeletal: Positive for gait problem.  Skin: Positive for wound.       Wound and great toe left foot  Allergic/Immunologic: Positive for immunocompromised state.       Diabetic  Psychiatric/Behavioral: Negative.   All other systems reviewed and are negative.    Physical Exam Updated Vital Signs BP 164/68 (BP Location: Left Arm)   Pulse 102   Temp  98.4 F (36.9 C)  (Oral)   Resp 19   Ht 5\' 6"  (1.676 m)   Wt 262 lb (118.8 kg)   SpO2 97%   BMI 42.29 kg/m   Physical Exam  Constitutional: She appears well-developed and well-nourished.  HENT:  Head: Normocephalic and atraumatic.  Eyes: Conjunctivae are normal. Pupils are equal, round, and reactive to light.  Neck: Neck supple. No tracheal deviation present. No thyromegaly present.  Cardiovascular: Normal rate and regular rhythm.   No murmur heard. Pulmonary/Chest: Effort normal and breath sounds normal.  Abdominal: Soft. Bowel sounds are normal. She exhibits no distension. There is no tenderness.  Musculoskeletal: Normal range of motion. She exhibits no edema or tenderness.  Left lower extremity dime-sized open wound at plantar surface great toe. Dorsum of foot is red and warm and mildly tender. Red streak to distal 1/4 of shin. No inguinal nodes. All other extremities are redness swelling or tenderness neurovascularly intact  Neurological: She is alert. Coordination normal.  Skin: Skin is warm and dry. No rash noted.  Psychiatric: She has a normal mood and affect.  Nursing note and vitals reviewed.    ED Treatments / Results  Labs (all labs ordered are listed, but only abnormal results are displayed) Labs Reviewed  CBG MONITORING, ED - Abnormal; Notable for the following:       Result Value   Glucose-Capillary 107 (*)    All other components within normal limits  CBC WITH DIFFERENTIAL/PLATELET  BASIC METABOLIC PANEL    EKG  EKG Interpretation None       Radiology Dg Ankle Complete Left  Result Date: 05/28/2016 CLINICAL DATA:  Pain and swelling EXAM: LEFT ANKLE COMPLETE - 3+ VIEW COMPARISON:  None. FINDINGS: Frontal, oblique, and lateral views were obtained. There is generalized soft tissue swelling. There is no evident fracture or joint effusion. Ankle mortise appears intact. No appreciable joint space narrowing. No erosive change or bony destruction. IMPRESSION: Generalized soft  tissue swelling. No fracture or joint effusion. No erosive change or bony destruction. No evident arthropathy. Electronically Signed   By: Lowella Grip III M.D.   On: 05/28/2016 13:42   Dg Foot Complete Left  Result Date: 05/28/2016 CLINICAL DATA:  Pain and swelling.  Recent wound first digit EXAM: LEFT FOOT - COMPLETE 3+ VIEW COMPARISON:  None. FINDINGS: Frontal, oblique, and lateral views obtained. There is a bandage overlying the first digit. No other radiopaque foreign body evident. There is soft tissue swelling throughout the dorsum of the foot. There is no acute fracture or dislocation. There is moderate narrowing of all PIP and DIP joints. No erosive change or bony destruction. No soft tissue abscess evident by radiography. IMPRESSION: Bandage overlying first digit. Generalized soft tissue swelling dorsally. No acute fracture or dislocation. No erosive change or bony destruction. Joint space narrowing in multiple distal joints. Electronically Signed   By: Lowella Grip III M.D.   On: 05/28/2016 13:44    Procedures Procedures (including critical care time)  Medications Ordered in ED Medications - No data to display  X-ray viewed by me Results for orders placed or performed during the hospital encounter of 05/28/16  CBC with Differential/Platelet  Result Value Ref Range   WBC 14.9 (H) 4.0 - 10.5 K/uL   RBC 4.96 3.87 - 5.11 MIL/uL   Hemoglobin 13.6 12.0 - 15.0 g/dL   HCT 42.7 36.0 - 46.0 %   MCV 86.1 78.0 - 100.0 fL   MCH 27.4 26.0 - 34.0 pg  MCHC 31.9 30.0 - 36.0 g/dL   RDW 14.8 11.5 - 15.5 %   Platelets 245 150 - 400 K/uL   Neutrophils Relative % 76 %   Neutro Abs 11.3 (H) 1.7 - 7.7 K/uL   Lymphocytes Relative 15 %   Lymphs Abs 2.2 0.7 - 4.0 K/uL   Monocytes Relative 7 %   Monocytes Absolute 1.1 (H) 0.1 - 1.0 K/uL   Eosinophils Relative 2 %   Eosinophils Absolute 0.3 0.0 - 0.7 K/uL   Basophils Relative 0 %   Basophils Absolute 0.0 0.0 - 0.1 K/uL  Basic metabolic  panel  Result Value Ref Range   Sodium 140 135 - 145 mmol/L   Potassium 3.5 3.5 - 5.1 mmol/L   Chloride 101 101 - 111 mmol/L   CO2 29 22 - 32 mmol/L   Glucose, Bld 111 (H) 65 - 99 mg/dL   BUN 16 6 - 20 mg/dL   Creatinine, Ser 0.80 0.44 - 1.00 mg/dL   Calcium 9.0 8.9 - 10.3 mg/dL   GFR calc non Af Amer >60 >60 mL/min   GFR calc Af Amer >60 >60 mL/min   Anion gap 10 5 - 15  CBG monitoring, ED  Result Value Ref Range   Glucose-Capillary 107 (H) 65 - 99 mg/dL   Dg Ankle Complete Left  Result Date: 05/28/2016 CLINICAL DATA:  Pain and swelling EXAM: LEFT ANKLE COMPLETE - 3+ VIEW COMPARISON:  None. FINDINGS: Frontal, oblique, and lateral views were obtained. There is generalized soft tissue swelling. There is no evident fracture or joint effusion. Ankle mortise appears intact. No appreciable joint space narrowing. No erosive change or bony destruction. IMPRESSION: Generalized soft tissue swelling. No fracture or joint effusion. No erosive change or bony destruction. No evident arthropathy. Electronically Signed   By: Lowella Grip III M.D.   On: 05/28/2016 13:42   Dg Foot Complete Left  Result Date: 05/28/2016 CLINICAL DATA:  Pain and swelling.  Recent wound first digit EXAM: LEFT FOOT - COMPLETE 3+ VIEW COMPARISON:  None. FINDINGS: Frontal, oblique, and lateral views obtained. There is a bandage overlying the first digit. No other radiopaque foreign body evident. There is soft tissue swelling throughout the dorsum of the foot. There is no acute fracture or dislocation. There is moderate narrowing of all PIP and DIP joints. No erosive change or bony destruction. No soft tissue abscess evident by radiography. IMPRESSION: Bandage overlying first digit. Generalized soft tissue swelling dorsally. No acute fracture or dislocation. No erosive change or bony destruction. Joint space narrowing in multiple distal joints. Electronically Signed   By: Lowella Grip III M.D.   On: 05/28/2016 13:44    Initial Impression / Assessment and Plan / ED Course  I have reviewed the triage vital signs and the nursing notes.  Pertinent labs & imaging results that were available during my care of the patient were reviewed by me and considered in my medical decision making (see chart for details).  Clinical Course   Tdap updated 6:20 PM patient reports pain is minimal after treatment with intravenous ceftriaxone. I consulted Dr.Duda via telephone. He suggests prescription doxycycline. Also a prescription for Norco to take as needed. He will see patient in office in 2 days. Patient is in agreement with plan  Final Clinical Impressions(s) / ED Diagnoses  Diagnosis abscess of left great toe with cellulitis and ascending lymphangitis of left foot and leg Final diagnoses:  None    New Prescriptions New Prescriptions   No medications  on file     Orlie Dakin, MD 05/28/16 [phone]

## 2016-05-28 NOTE — ED Triage Notes (Addendum)
PT C/O LEFT FOOT AND GREAT TOE SWELLING AND PAIN SINCE Thursday. PT STS THE TOE OPENED AND FLUID BEGAN TO LEAK OUT. NOW, THE LEG IS ALSO SWOLLEN. DENIES ANY FEVER OR CHILLS. DENIES INJURY.

## 2016-05-28 NOTE — ED Notes (Signed)
Bandage & wrap applied to left great toe & left foot.

## 2016-05-30 ENCOUNTER — Inpatient Hospital Stay (INDEPENDENT_AMBULATORY_CARE_PROVIDER_SITE_OTHER): Payer: Medicare Other | Admitting: Orthopedic Surgery

## 2016-05-30 ENCOUNTER — Encounter: Payer: Medicare Other | Admitting: Neurology

## 2016-05-30 DIAGNOSIS — L03032 Cellulitis of left toe: Secondary | ICD-10-CM | POA: Diagnosis not present

## 2016-05-30 DIAGNOSIS — L97521 Non-pressure chronic ulcer of other part of left foot limited to breakdown of skin: Secondary | ICD-10-CM

## 2016-06-03 ENCOUNTER — Telehealth: Payer: Self-pay | Admitting: *Deleted

## 2016-06-03 ENCOUNTER — Other Ambulatory Visit: Payer: Medicare Other

## 2016-06-03 NOTE — Telephone Encounter (Signed)
06/02/16  12:55pm Patient called and requested a return phone call needing to cancel her appointment has an infection had minor surgery.   06/03/16 Returned patients phone call she states that she needs to cancel her appointment for her shoe measurement because she has an infection with swelling in her foot the she had to go to the ER for.  I told he I would cancel her appointment for her.  I told her the authorization for her her shoes is good until Feb of 2018 and to please take care of herself.  She will call us to reschedule when she is feeling well enough.

## 2016-06-10 ENCOUNTER — Ambulatory Visit: Payer: Self-pay

## 2016-06-14 ENCOUNTER — Ambulatory Visit (INDEPENDENT_AMBULATORY_CARE_PROVIDER_SITE_OTHER): Payer: Medicare Other | Admitting: Orthopedic Surgery

## 2016-06-14 ENCOUNTER — Encounter (INDEPENDENT_AMBULATORY_CARE_PROVIDER_SITE_OTHER): Payer: Self-pay | Admitting: Orthopedic Surgery

## 2016-06-14 VITALS — Ht 66.0 in | Wt 260.0 lb

## 2016-06-14 DIAGNOSIS — M5136 Other intervertebral disc degeneration, lumbar region: Secondary | ICD-10-CM | POA: Insufficient documentation

## 2016-06-14 DIAGNOSIS — L97521 Non-pressure chronic ulcer of other part of left foot limited to breakdown of skin: Secondary | ICD-10-CM | POA: Diagnosis not present

## 2016-06-14 DIAGNOSIS — M3500 Sicca syndrome, unspecified: Secondary | ICD-10-CM | POA: Insufficient documentation

## 2016-06-14 DIAGNOSIS — E039 Hypothyroidism, unspecified: Secondary | ICD-10-CM | POA: Insufficient documentation

## 2016-06-14 DIAGNOSIS — M170 Bilateral primary osteoarthritis of knee: Secondary | ICD-10-CM | POA: Insufficient documentation

## 2016-06-14 DIAGNOSIS — M05731 Rheumatoid arthritis with rheumatoid factor of right wrist without organ or systems involvement: Secondary | ICD-10-CM | POA: Insufficient documentation

## 2016-06-14 DIAGNOSIS — M810 Age-related osteoporosis without current pathological fracture: Secondary | ICD-10-CM | POA: Insufficient documentation

## 2016-06-14 DIAGNOSIS — Z79899 Other long term (current) drug therapy: Secondary | ICD-10-CM | POA: Insufficient documentation

## 2016-06-14 DIAGNOSIS — M05732 Rheumatoid arthritis with rheumatoid factor of left wrist without organ or systems involvement: Secondary | ICD-10-CM

## 2016-06-14 NOTE — Progress Notes (Deleted)
*IMAGE* Office Visit Note  Patient: Katherine Delgado             Date of Birth: [birth year]           MRN: [id number]             PCP: Hollace Kinnier, DO Referring: Gayland Curry, DO Visit Date: 06/15/2016    Subjective:  No chief complaint on file.   History of Present Illness: Katherine Delgado is a 75 y.o. female ***   Activities of Daily Living:  Patient reports morning stiffness for *** {minute/hour:19697}.   Patient {ACTIONS;DENIES/REPORTS:21021675::"Denies"} nocturnal pain.  Difficulty dressing/grooming: {ACTIONS;DENIES/REPORTS:21021675::"Denies"} Difficulty climbing stairs: {ACTIONS;DENIES/REPORTS:21021675::"Denies"} Difficulty getting out of chair: {ACTIONS;DENIES/REPORTS:21021675::"Denies"} Difficulty using Delgado for taps, buttons, cutlery, and/or writing: {ACTIONS;DENIES/REPORTS:21021675::"Denies"}   No Rheumatology ROS completed.   PMFS History:  Patient Active Problem List   Diagnosis Date Noted  . Rheumatoid arthritis with positive rheumatoid factor ([place]) 06/14/2016  . High risk medication use 06/14/2016  . Sjogren's syndrome ([place]) 06/14/2016  . Primary osteoarthritis of both knees 06/14/2016  . Osteoarthritis of lumbar spine 06/14/2016  . Hypothyroidism 06/14/2016  . Osteoporosis 06/14/2016  . Abnormality of gait 04/19/2016  . Type 2 diabetes mellitus with diabetic polyneuropathy, with long-term current use of insulin ([place]) 11/27/2015  . Chronic fatigue fibromyalgia syndrome 11/27/2015  . Severe obesity (BMI >= 40) ([place]) 04/21/2014  . Type II or unspecified type diabetes mellitus with peripheral circulatory disorders, uncontrolled(250.72) 09/12/2013  . Osteopenia, senile   . Headache(784.0) 07/31/2013  . Sinus infection 07/31/2013  . Chronic low back pain 03/14/2013  . Insomnia 12/06/2012  . Nausea alone 12/06/2012  . Diarrhea 12/06/2012  . Urinary incontinence, urge 12/06/2012  . Lumbosacral root lesions, not elsewhere classified 11/27/2012  . Polyneuropathy  in other diseases classified elsewhere ([place]) 11/27/2012  . EDEMA 05/04/2010  . YEAST INFECTION 05/03/2010  . DIABETES MELLITUS 05/03/2010  . Hyperlipidemia 05/03/2010  . OBESITY 05/03/2010  . DEPRESSION 05/03/2010  . PERIPHERAL NEUROPATHY 05/03/2010  . GUILLAIN-BARRE SYNDROME 05/03/2010  . HYPERTENSION 05/03/2010  . ALLERGIC RHINITIS 05/03/2010  . PNEUMONIA 05/03/2010  . COPD 05/03/2010  . GERD 05/03/2010  . Fibromyalgia 05/03/2010  . DYSPNEA 05/03/2010  . CHEST PAIN 05/03/2010    Past Medical History:  Diagnosis Date  . Abnormal weight gain   . Abnormality of gait 04/19/2016  . Acute infective polyneuritis ([place]) 2002  . Acute maxillary sinusitis   . Acute sinusitis, unspecified   . Allergic rhinitis due to pollen   . Arthritis   . Back injury   . Candidiasis of skin and nails   . Candidiasis of vulva and vagina   . Chronic pain syndrome   . COPD (chronic obstructive pulmonary disease) ([place])   . Cough   . Degenerative arthritis   . Depressive disorder, not elsewhere classified   . Diabetes mellitus without complication ([place])   . Diaphragmatic hernia without mention of obstruction or gangrene   . Dyslipidemia   . Edema   . Fibromyalgia   . GERD (gastroesophageal reflux disease)   . Guillain-Barre syndrome ([place])   . History of benign thymus tumor   . Hypertension   . Insomnia, unspecified   . Lumbago   . Miscarriage 1962  . Mixed hyperlipidemia   . Morbid obesity ([place])   . Obstructive chronic bronchitis with acute bronchitis ([place])   . Osteopenia, senile   . Osteoporosis   . Other malaise and fatigue   . Other specified disease of white blood cells   .  Pain in joint, multiple sites   . Polyneuropathy in diabetes(357.2)   . RA (rheumatoid arthritis) ([place])   . Reflux esophagitis   . Rheumatoid arthritis(714.0)   . Shortness of breath   . Spinal stenosis, lumbar region, without neurogenic claudication   . Spondylosis, lumbosacral   . Spontaneous ecchymoses   .  Stiffness of joints, not elsewhere classified, multiple sites   . Tear film insufficiency, unspecified   . Thyroid disease   . Thyroid disorder   . Type II or unspecified type diabetes mellitus with peripheral circulatory disorders, uncontrolled(250.72)   . Unspecified chronic bronchitis   . Unspecified essential hypertension   . Unspecified hypothyroidism   . Unspecified pruritic disorder   . Unspecified urinary incontinence   . Urinary tract infection, site not specified     Family History  Problem Relation Age of Onset  . Alzheimer's disease Mother   . Heart disease Mother   . Heart disease Father   . Liver disease Father   . Breast cancer Sister   . Colon polyps Brother   . Colon cancer Neg Hx   . Esophageal cancer Neg Hx   . Kidney disease Neg Hx   . Stomach cancer Neg Hx   . Rectal cancer Neg Hx    Past Surgical History:  Procedure Laterality Date  . ABDOMINAL HYSTERECTOMY  1974  . abdominal tumor  2002  . APPENDECTOMY    . [place]  . BRONCHOSCOPY  2001  . CHOLECYSTECTOMY  1984  . KNEE SURGERY Bilateral 08/27/2010 (L) and 01/11/2011 (R)  . [place]  . OVARIAN CYST SURGERY  1968  . thymus tumor    . 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 TONSILLECTOMY     Social History   Social History Narrative   Walks with cane     Objective: Vital Signs: There were no vitals taken for this visit.   Physical Exam   Musculoskeletal Exam: ***  CDAI Exam: No CDAI exam completed.    Investigation: Findings:  Labs from June 2017 comprehensive metabolic panel showed creatinine of 1.0 AST 63 ALT 38 GFR 55 CBC showed white cell count of 17.1 TB gold was negative.    Imaging: Dg Ankle Complete Left  Result Date: 05/28/2016 CLINICAL DATA:  Pain and swelling EXAM: LEFT ANKLE COMPLETE - 3+ VIEW COMPARISON:  None. FINDINGS: Frontal, oblique, and lateral views were obtained. There is generalized soft tissue swelling. There is no evident fracture or joint effusion.  Ankle mortise appears intact. No appreciable joint space narrowing. No erosive change or bony destruction. IMPRESSION: Generalized soft tissue swelling. No fracture or joint effusion. No erosive change or bony destruction. No evident arthropathy. Electronically Signed   By: Lowella Grip III M.D.   On: 05/28/2016 13:42   Dg Foot Complete Left  Result Date: 05/28/2016 CLINICAL DATA:  Pain and swelling.  Recent wound first digit EXAM: LEFT FOOT - COMPLETE 3+ VIEW COMPARISON:  None. FINDINGS: Frontal, oblique, and lateral views obtained. There is a bandage overlying the first digit. No other radiopaque foreign body evident. There is soft tissue swelling throughout the dorsum of the foot. There is no acute fracture or dislocation. There is moderate narrowing of all PIP and DIP joints. No erosive change or bony destruction. No soft tissue abscess evident by radiography. IMPRESSION: Bandage overlying first digit. Generalized soft tissue swelling dorsally. No acute fracture or dislocation. No erosive change or bony destruction. Joint space narrowing in multiple distal joints.  Electronically Signed   By: Lowella Grip III M.D.   On: 05/28/2016 13:44    Speciality Comments: No specialty comments available.    Procedures:  No procedures performed Allergies: Penicillins and Sulfamethoxazole-trimethoprim   Assessment / Plan: Visit Diagnoses: Rheumatoid arthritis with positive rheumatoid factor, involving unspecified site ([place])  High risk medication use  Fibromyalgia  Sjogren's syndrome, with unspecified organ involvement (HCC)  Primary osteoarthritis of both knees  Osteoarthritis of lumbar spine, unspecified spinal osteoarthritis complication status  Hypothyroidism, unspecified type  Osteoporosis, unspecified osteoporosis type, unspecified pathological fracture presence   No problem-specific Assessment & Plan notes found for this encounter.   Follow-Up Instructions: No Follow-up on  file.  Orders: No orders of the defined types were placed in this encounter.  No orders of the defined types were placed in this encounter.

## 2016-06-14 NOTE — Progress Notes (Signed)
Wound Care Note   Patient: Katherine Delgado           Date of Birth: [birth year]           MRN: [id number]             PCP: Hollace Kinnier, DO Visit Date: 06/14/2016   Assessment & Plan: Visit Diagnoses:  1. Ulcer of toe of left foot, limited to breakdown of skin ([place])     Plan: Postoperative shoe fell leaving down at dialysis of cleansing antibiotic ointment and Band-Aid. Patient provided postop shoe today   Follow-Up Instructions: No Follow-up on file.  Orders:  No orders of the defined types were placed in this encounter.  Meds ordered this encounter  Medications  . levothyroxine (SYNTHROID, LEVOTHROID) 150 MCG tablet    Sig: TK 1 T PO 30 MIN B BRE    Refill:  3      Procedures: No notes on file   Clinical Data: No additional findings.   No images are attached to the encounter.   Subjective: Chief Complaint  Patient presents with  . Left Great Toe - Open Wound  . Follow-up    left great toe abscess/cellulitis    Patient presents today in office for two week follow up of left great toe abscess with prior cellulitis. She was initially evaluated in the ER on 05/28/16 she had ascending cellulitis and redness running up her leg and she was administered IV abx. She was than sent home with oral antibiotic treatment, doxycycline 100mg  1 po bid 10 day supply. She had imaging done in ER, negative for osteomyelitis. She came in the office on 05/30/16 and saw Junie Panning, at which time skin was debrided and she was provided with felt relieving donut. Patient has been using this but feels the pads do not fit toe very well. She is concerned about the infection probing to bone if no new intervention is started. She is wanting to know if a new abx is appropriate. She is currently dressing ulcer with antibiotic ointment daily. The redness has resolved per patient report. There is new callus build up plantar aspect of left great toe. She denies any fever, any nausea, any chills or sweats.      Review of Systems  Miscellaneous:  -Home Health Care: no  -Physical Therapy: no     Objective: Vital Signs: Ht 5\' 6"  (1.676 m)   Wt 260 lb (117.9 kg)   BMI 41.97 kg/m   Physical Exam: On examination patient has a good dorsalis pedis pulse she does have some swelling the great toe but there is no cellulitis no odor no drainage no signs of infection.  Patient has a Wagner grade 1 ulcer beneath the left great toe. After informed consent a 10 blade knife was used to debride the skin and soft tissue back to bleeding viable granulation tissue. Silver nitrate was used for hemostasis. The ulcers 2 cm in diameter 2 mm deep. Band-Aid was applied.  Specialty Comments: No specialty comments available.   PMFS History: Patient Active Problem List   Diagnosis Date Noted  . Rheumatoid arthritis with positive rheumatoid factor ([place]) 06/14/2016  . High risk medication use 06/14/2016  . Sjogren's syndrome ([place]) 06/14/2016  . Primary osteoarthritis of both knees 06/14/2016  . Osteoarthritis of lumbar spine 06/14/2016  . Hypothyroidism 06/14/2016  . Osteoporosis 06/14/2016  . Abnormality of gait 04/19/2016  . Type 2 diabetes mellitus with diabetic polyneuropathy, with long-term current use of insulin ([place])  11/27/2015  . Chronic fatigue fibromyalgia syndrome 11/27/2015  . Severe obesity (BMI >= 40) ([place]) 04/21/2014  . Type II or unspecified type diabetes mellitus with peripheral circulatory disorders, uncontrolled(250.72) 09/12/2013  . Osteopenia, senile   . Headache(784.0) 07/31/2013  . Sinus infection 07/31/2013  . Chronic low back pain 03/14/2013  . Insomnia 12/06/2012  . Nausea alone 12/06/2012  . Diarrhea 12/06/2012  . Urinary incontinence, urge 12/06/2012  . Lumbosacral root lesions, not elsewhere classified 11/27/2012  . Polyneuropathy in other diseases classified elsewhere ([place]) 11/27/2012  . EDEMA 05/04/2010  . YEAST INFECTION 05/03/2010  . DIABETES MELLITUS 05/03/2010  .  Hyperlipidemia 05/03/2010  . OBESITY 05/03/2010  . DEPRESSION 05/03/2010  . PERIPHERAL NEUROPATHY 05/03/2010  . GUILLAIN-BARRE SYNDROME 05/03/2010  . HYPERTENSION 05/03/2010  . ALLERGIC RHINITIS 05/03/2010  . PNEUMONIA 05/03/2010  . COPD 05/03/2010  . GERD 05/03/2010  . Fibromyalgia 05/03/2010  . DYSPNEA 05/03/2010  . CHEST PAIN 05/03/2010   Past Medical History:  Diagnosis Date  . Abnormal weight gain   . Abnormality of gait 04/19/2016  . Acute infective polyneuritis ([place]) 2002  . Acute maxillary sinusitis   . Acute sinusitis, unspecified   . Allergic rhinitis due to pollen   . Arthritis   . Back injury   . Candidiasis of skin and nails   . Candidiasis of vulva and vagina   . Chronic pain syndrome   . COPD (chronic obstructive pulmonary disease) ([place])   . Cough   . Degenerative arthritis   . Depressive disorder, not elsewhere classified   . Diabetes mellitus without complication ([place])   . Diaphragmatic hernia without mention of obstruction or gangrene   . Dyslipidemia   . Edema   . Fibromyalgia   . GERD (gastroesophageal reflux disease)   . Guillain-Barre syndrome ([place])   . History of benign thymus tumor   . Hypertension   . Insomnia, unspecified   . Lumbago   . Miscarriage 1962  . Mixed hyperlipidemia   . Morbid obesity ([place])   . Obstructive chronic bronchitis with acute bronchitis ([place])   . Osteopenia, senile   . Osteoporosis   . Other malaise and fatigue   . Other specified disease of white blood cells   . Pain in joint, multiple sites   . Polyneuropathy in diabetes(357.2)   . RA (rheumatoid arthritis) ([place])   . Reflux esophagitis   . Rheumatoid arthritis(714.0)   . Shortness of breath   . Spinal stenosis, lumbar region, without neurogenic claudication   . Spondylosis, lumbosacral   . Spontaneous ecchymoses   . Stiffness of joints, not elsewhere classified, multiple sites   . Tear film insufficiency, unspecified   . Thyroid disease   . Thyroid disorder    . Type II or unspecified type diabetes mellitus with peripheral circulatory disorders, uncontrolled(250.72)   . Unspecified chronic bronchitis   . Unspecified essential hypertension   . Unspecified hypothyroidism   . Unspecified pruritic disorder   . Unspecified urinary incontinence   . Urinary tract infection, site not specified     Family History  Problem Relation Age of Onset  . Alzheimer's disease Mother   . Heart disease Mother   . Heart disease Father   . Liver disease Father   . Breast cancer Sister   . Colon polyps Brother   . Colon cancer Neg Hx   . Esophageal cancer Neg Hx   . Kidney disease Neg Hx   . Stomach cancer Neg Hx   . Rectal cancer  Neg Hx    Past Surgical History:  Procedure Laterality Date  . ABDOMINAL HYSTERECTOMY  1974  . abdominal tumor  2002  . APPENDECTOMY    . [place]  . BRONCHOSCOPY  2001  . CHOLECYSTECTOMY  1984  . KNEE SURGERY Bilateral 08/27/2010 (L) and 01/11/2011 (R)  . [place]  . OVARIAN CYST SURGERY  1968  . thymus tumor    . 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 TONSILLECTOMY     Social History   Occupational History  . Retired    Social History Main Topics  . Smoking status: Former Smoker    Types: Cigarettes    Quit date: 12/07/1979  . Smokeless tobacco: Never Used  . Alcohol use No  . Drug use: No  . Sexual activity: No

## 2016-06-15 ENCOUNTER — Ambulatory Visit: Payer: Medicare Other | Admitting: Rheumatology

## 2016-06-15 ENCOUNTER — Ambulatory Visit: Payer: Medicare Other | Admitting: Podiatry

## 2016-06-16 ENCOUNTER — Other Ambulatory Visit: Payer: Self-pay | Admitting: Rheumatology

## 2016-06-16 DIAGNOSIS — M2550 Pain in unspecified joint: Secondary | ICD-10-CM | POA: Diagnosis not present

## 2016-06-16 DIAGNOSIS — Z79899 Other long term (current) drug therapy: Secondary | ICD-10-CM | POA: Diagnosis not present

## 2016-06-16 LAB — CBC WITH DIFFERENTIAL/PLATELET
BASOS ABS: 173 {cells}/uL (ref 0–200)
Basophils Relative: 1 %
EOS ABS: 519 {cells}/uL — AB (ref 15–500)
EOS PCT: 3 %
HEMATOCRIT: 44.6 % (ref 35.0–45.0)
HEMOGLOBIN: 14.5 g/dL (ref 11.7–15.5)
LYMPHS ABS: 2595 {cells}/uL (ref 850–3900)
Lymphocytes Relative: 15 %
MCH: 27.6 pg (ref 27.0–33.0)
MCHC: 32.5 g/dL (ref 32.0–36.0)
MCV: 85 fL (ref 80.0–100.0)
MONO ABS: 1211 {cells}/uL — AB (ref 200–950)
MPV: 10.6 fL (ref 7.5–12.5)
Monocytes Relative: 7 %
NEUTROS ABS: 12802 {cells}/uL — AB (ref 1500–7800)
NEUTROS PCT: 74 %
Platelets: 358 10*3/uL (ref 140–400)
RBC: 5.25 MIL/uL — ABNORMAL HIGH (ref 3.80–5.10)
RDW: 14.5 % (ref 11.0–15.0)
WBC: 17.3 10*3/uL — ABNORMAL HIGH (ref 3.8–10.8)

## 2016-06-16 LAB — COMPLETE METABOLIC PANEL WITH GFR
ALBUMIN: 4 g/dL (ref 3.6–5.1)
ALT: 37 U/L — ABNORMAL HIGH (ref 6–29)
AST: 50 U/L — AB (ref 10–35)
Alkaline Phosphatase: 105 U/L (ref 33–130)
BUN: 15 mg/dL (ref 7–25)
CHLORIDE: 100 mmol/L (ref 98–110)
CO2: 25 mmol/L (ref 20–31)
Calcium: 9.2 mg/dL (ref 8.6–10.4)
Creat: 0.85 mg/dL (ref 0.60–0.93)
GFR, EST NON AFRICAN AMERICAN: 67 mL/min (ref >=60)
GFR, Est African American: 78 mL/min (ref >=60)
GLUCOSE: 207 mg/dL — AB (ref 65–99)
POTASSIUM: 4.3 mmol/L (ref 3.5–5.3)
SODIUM: 138 mmol/L (ref 135–146)
Total Bilirubin: 0.3 mg/dL (ref 0.2–1.2)
Total Protein: 7.1 g/dL (ref 6.1–8.1)

## 2016-06-17 LAB — QUANTIFERON TB GOLD ASSAY (BLOOD)
INTERFERON GAMMA RELEASE ASSAY: NEGATIVE
Mitogen-Nil: 3.49 IU/mL
QUANTIFERON TB AG MINUS NIL: 0.01 [IU]/mL
Quantiferon Nil Value: 0.05 IU/mL

## 2016-06-20 ENCOUNTER — Telehealth: Payer: Self-pay | Admitting: Radiology

## 2016-06-20 ENCOUNTER — Other Ambulatory Visit: Payer: Self-pay | Admitting: Internal Medicine

## 2016-06-20 NOTE — Telephone Encounter (Signed)
Called patient to advise labs are c/w previous  

## 2016-06-21 ENCOUNTER — Telehealth: Payer: Self-pay | Admitting: Internal Medicine

## 2016-06-21 NOTE — Telephone Encounter (Signed)
left msg asking pt to confirm if she can come for AWV with nurse on the same day as labs. VDM (DD)

## 2016-06-22 ENCOUNTER — Encounter: Payer: Self-pay | Admitting: Neurology

## 2016-06-22 ENCOUNTER — Ambulatory Visit (INDEPENDENT_AMBULATORY_CARE_PROVIDER_SITE_OTHER): Payer: Self-pay | Admitting: Neurology

## 2016-06-22 ENCOUNTER — Ambulatory Visit (INDEPENDENT_AMBULATORY_CARE_PROVIDER_SITE_OTHER): Payer: Medicare Other | Admitting: Neurology

## 2016-06-22 DIAGNOSIS — E1142 Type 2 diabetes mellitus with diabetic polyneuropathy: Secondary | ICD-10-CM | POA: Diagnosis not present

## 2016-06-22 DIAGNOSIS — Z794 Long term (current) use of insulin: Secondary | ICD-10-CM

## 2016-06-22 DIAGNOSIS — R269 Unspecified abnormalities of gait and mobility: Secondary | ICD-10-CM

## 2016-06-22 NOTE — Procedures (Signed)
     HISTORY:  Katherine Delgado is a 75 year old patient with a history of diabetes, obesity, and prior lumbosacral spine surgery at the L5-S1 level. The patient reports some generalized discomfort involving the arms and legs that began in August 2017. The symptoms have returned to baseline more recently. The patient does report some right hip and right thigh discomfort with weightbearing. She is being evaluated for these issues.  NERVE CONDUCTION STUDIES:  Nerve conduction studies were done on the left upper extremity. The distal motor latency for the left median nerve was prolonged with a normal motor amplitude. The distal motor latency and motor amplitude for the left ulnar nerve is normal. The F wave latencies and nerve conduction velocities for the left median and ulnar nerves were normal. The sensory latency for the left median nerve was prolonged, normal for the left ulnar nerve.  Nerve conduction studies were performed on both lower extremities. The distal motor latencies for the peroneal nerves were normal on the right, prolonged on the left, with low motor amplitudes for these nerves bilaterally. The distal motor latency for the right posterior tibial nerve was low with a low motor amplitude. No response was seen on the left side. The nerve conduction velocities for the peroneal nerves bilaterally and for the right posterior tibial nerves were normal. The sensory latencies for the peroneal nerves were absent bilaterally. The H reflex latencies were absent bilaterally.  EMG STUDIES:  EMG study was performed on the right lower extremity:  The tibialis anterior muscle reveals 2 to 5K motor units with decreased recruitment. No fibrillations or positive waves were seen. The peroneus tertius muscle reveals 2 to 5K motor units with decreased recruitment. No fibrillations or positive waves were seen. The medial gastrocnemius muscle reveals 2 to 4K motor units with decreased recruitment. No  fibrillations or positive waves were seen. The vastus lateralis muscle reveals 2 to 4K motor units with full recruitment. No fibrillations or positive waves were seen. The iliopsoas muscle reveals 2 to 4K motor units with full recruitment. No fibrillations or positive waves were seen. The biceps femoris muscle (long head) reveals 2 to 4K motor units with full recruitment. No fibrillations or positive waves were seen. The lumbosacral paraspinal muscles were tested at 3 levels, and revealed no abnormalities of insertional activity at the upper level tested. 1+ positive waves were noted at the middle and lower levels. There was good relaxation.   IMPRESSION:  Nerve conduction studies done on both lower extremities and on the left upper extremity shows evidence of a generalized primarily axonal peripheral neuropathy of severe severity. The patient has a mild left carpal tunnel syndrome. In comparison to a prior study done in April 2014, there has been some progression of the peripheral neuropathy on this evaluation. EMG evaluation of the right lower extremity shows distal chronic stable changes of denervation consistent with a peripheral neuropathy. There is some mild denervation of the lower lumbosacral paraspinal muscles, and an overlying L5 or S1 radiculopathy cannot be excluded, but changes such as this may be related to the prior lumbosacral spine surgery. Clinical correlation is required.  Jill Alexanders MD 06/22/2016 4:22 PM  [facility] [address] [place], [place] [address]  Phone [phone] Fax [phone]

## 2016-06-22 NOTE — Progress Notes (Signed)
The patient comes in for EMG and nerve conduction study evaluation. She has diabetes, and a neuropathy. The patient also reports right hip pain, going into the groin area and down the thigh to the knee. This is worse with weightbearing. The patient does have chronic low back pain.  Nerve conduction studies do show evidence of a peripheral neuropathy that is primarily axonal in nature. The patient seems to have had some worsening of the neuropathy since 2014. There is some mild denervation of the lower lumbar sacral paraspinal muscles, cannot exclude an overlying mild lumbosacral radiculopathy at the L5 or S1 level. The patient has had prior lumbosacral spine surgery, changes on EMG may be related to the prior surgery.  X-ray the right hip done in May 2017 does show some degenerative changes, the patient indicates that the right hip discomfort prevents her from walking longer distances. She may require an orthopedic referral.

## 2016-06-22 NOTE — Progress Notes (Signed)
Please refer to EMG and nerve conduction study procedure note. 

## 2016-06-23 ENCOUNTER — Telehealth: Payer: Self-pay

## 2016-06-23 DIAGNOSIS — M545 Low back pain: Secondary | ICD-10-CM

## 2016-06-23 DIAGNOSIS — G8929 Other chronic pain: Secondary | ICD-10-CM

## 2016-06-23 DIAGNOSIS — R5382 Chronic fatigue, unspecified: Principal | ICD-10-CM

## 2016-06-23 DIAGNOSIS — M797 Fibromyalgia: Secondary | ICD-10-CM

## 2016-06-23 DIAGNOSIS — G9332 Myalgic encephalomyelitis/chronic fatigue syndrome: Secondary | ICD-10-CM

## 2016-06-23 MED ORDER — HYDROCODONE-ACETAMINOPHEN 10-325 MG PO TABS
ORAL_TABLET | ORAL | 0 refills | Status: DC
Start: 2016-06-23 — End: 2016-07-22

## 2016-06-23 MED ORDER — OXYCODONE HCL 5 MG PO TABS: ORAL_TABLET | ORAL | 0 refills | Status: DC

## 2016-06-23 NOTE — Telephone Encounter (Signed)
Spoke with patient, patient is requesting refill on Hydrocodone 10-325 mg and Oxycodone 5 mg.   I seen that rx for Hydrocodone was given by ER doctor on 05/28/16 for #240 tablets. Patient states rx was attached to her rx for antibiotic and she told pharmacy not to fill for Dr.Reed is the only doctor to rx controlled medications.  I called [facility] and confirmed, rx given on 05/28/16 by ER doctor was not filled. Both rx's printed and placed on ledge for signature.

## 2016-06-24 DIAGNOSIS — Z1231 Encounter for screening mammogram for malignant neoplasm of breast: Secondary | ICD-10-CM | POA: Diagnosis not present

## 2016-06-24 LAB — HM MAMMOGRAPHY

## 2016-06-25 ENCOUNTER — Other Ambulatory Visit: Payer: Self-pay | Admitting: Internal Medicine

## 2016-06-28 ENCOUNTER — Ambulatory Visit (INDEPENDENT_AMBULATORY_CARE_PROVIDER_SITE_OTHER): Payer: Medicare Other | Admitting: Orthopedic Surgery

## 2016-06-28 ENCOUNTER — Encounter: Payer: Self-pay | Admitting: Rheumatology

## 2016-06-30 ENCOUNTER — Other Ambulatory Visit: Payer: Medicare Other

## 2016-07-01 ENCOUNTER — Other Ambulatory Visit: Payer: Self-pay | Admitting: Pharmacotherapy

## 2016-07-01 ENCOUNTER — Other Ambulatory Visit: Payer: Self-pay | Admitting: Internal Medicine

## 2016-07-04 ENCOUNTER — Other Ambulatory Visit: Payer: Self-pay | Admitting: Rheumatology

## 2016-07-04 ENCOUNTER — Ambulatory Visit: Payer: Medicare Other | Admitting: Internal Medicine

## 2016-07-04 NOTE — Telephone Encounter (Signed)
Last visit 05/30/16 Next visit 07/06/16 Labs c/w previous last week TB neg  Ok to refill per Dr Estanislado Pandy

## 2016-07-05 NOTE — Progress Notes (Addendum)
Office Visit Note  Patient: Katherine Delgado             Date of Birth: 04-09-1941           MRN: [id number]             PCP: Hollace Kinnier, DO Referring: Gayland Curry, DO Visit Date: 07/06/2016 Occupation: @GUAROCC @    Subjective:  Follow-up Follow-up on rheumatoid arthritis, high-risk prescription, fibromyalgia, and left wrist with tenosynovitis.  History of Present Illness: Katherine Delgado is a 75 y.o. female  She was last seen in our office 02/02/2016. At that time her rheumatoid arthritis was doing fairly well with still Jantz 5 mg twice a day. She did have erosive disease and ongoing joint pain. We offered her methotrexate but patient elected not to take any methotrexate. On the last visit patient states that she was going to start methotrexate but today she states that she elected not to start it yet again. I suspect that the methotrexate may have been helpful to prevent the tenosynovitis that were noticing on her left wrist today. I've encouraged the patient to start the medication and she is agreeable. She will do 0.6 ML's every week.  Patient rates her fibromyalgia discomfort today at a 4-5 on a scale of 0-10. When she flares she feels that it is a 7. Patient states that her fatigue is rated 7-8 on a scale of 0-10.  She has ongoing bilateral knee joint pain.  She has feet pain and neuropathy but there was a callus formation that became infected. [facility]. Soon after that visit it appeared that the area of concern became infected so patient saw Dr. Sharol Given and he is treating her with antibiotic and office visits. Over the last few days patient's infection has improved. Despite that I noticed that her October 2017 lab shows a white count of 17.3. Back in October 70 was 14.9. Due to this elevated white count I've elected to repeat CBC with differential and of also added CMP with GFR. She had a history of elevated glucose on the last labs at 207. Patient  is a known diabetic but I plan to give her prednisone today and she will monitor her glucose and coordinate with her family physician.  Activities of Daily Living:  Patient reports morning stiffness for 30 minutes.   Patient Reports nocturnal pain.  Difficulty dressing/grooming: Reports Difficulty climbing stairs: Reports Difficulty getting out of chair: Reports Difficulty using hands for taps, buttons, cutlery, and/or writing: Reports   Review of Systems  Constitutional: Positive for fatigue.  HENT: Negative for mouth sores and mouth dryness.   Eyes: Negative for dryness.  Respiratory: Negative for shortness of breath.   Gastrointestinal: Negative for constipation and diarrhea.  Musculoskeletal: Positive for myalgias and myalgias.  Skin: Negative for sensitivity to sunlight.  Psychiatric/Behavioral: Positive for sleep disturbance. Negative for decreased concentration.    PMFS History:  Patient Active Problem List   Diagnosis Date Noted  . Rheumatoid arthritis with positive rheumatoid factor ([place]) 06/14/2016  . High risk medication use 06/14/2016  . Sjogren's syndrome ([place]) 06/14/2016  . Primary osteoarthritis of both knees 06/14/2016  . Osteoarthritis of lumbar spine 06/14/2016  . Hypothyroidism 06/14/2016  . Osteoporosis 06/14/2016  . Abnormality of gait 04/19/2016  . Type 2 diabetes mellitus with diabetic polyneuropathy, with long-term current use of insulin ([place]) 11/27/2015  . Chronic fatigue fibromyalgia syndrome 11/27/2015  . Severe obesity (BMI >= 40) (  [place]) 04/21/2014  . Type II or unspecified type diabetes mellitus with peripheral circulatory disorders, uncontrolled(250.72) 09/12/2013  . Osteopenia, senile   . Headache(784.0) 07/31/2013  . Sinus infection 07/31/2013  . Chronic low back pain 03/14/2013  . Insomnia 12/06/2012  . Nausea alone 12/06/2012  . Diarrhea 12/06/2012  . Urinary incontinence, urge 12/06/2012  . Lumbosacral root lesions, not elsewhere  classified 11/27/2012  . Polyneuropathy in other diseases classified elsewhere ([place]) 11/27/2012  . EDEMA 05/04/2010  . YEAST INFECTION 05/03/2010  . DIABETES MELLITUS 05/03/2010  . Hyperlipidemia 05/03/2010  . OBESITY 05/03/2010  . DEPRESSION 05/03/2010  . PERIPHERAL NEUROPATHY 05/03/2010  . GUILLAIN-BARRE SYNDROME 05/03/2010  . HYPERTENSION 05/03/2010  . ALLERGIC RHINITIS 05/03/2010  . PNEUMONIA 05/03/2010  . COPD 05/03/2010  . GERD 05/03/2010  . Fibromyalgia 05/03/2010  . DYSPNEA 05/03/2010  . CHEST PAIN 05/03/2010    Past Medical History:  Diagnosis Date  . Abnormal weight gain   . Abnormality of gait 04/19/2016  . Acute infective polyneuritis ([place]) 2002  . Acute maxillary sinusitis   . Acute sinusitis, unspecified   . Allergic rhinitis due to pollen   . Arthritis   . Back injury   . Candidiasis of skin and nails   . Candidiasis of vulva and vagina   . Chronic pain syndrome   . COPD (chronic obstructive pulmonary disease) ([place])   . Cough   . Degenerative arthritis   . Depressive disorder, not elsewhere classified   . Diabetes mellitus without complication ([place])   . Diaphragmatic hernia without mention of obstruction or gangrene   . Dyslipidemia   . Edema   . Fibromyalgia   . GERD (gastroesophageal reflux disease)   . Guillain-Barre syndrome ([place])   . History of benign thymus tumor   . Hypertension   . Insomnia, unspecified   . Lumbago   . Miscarriage 1962  . Mixed hyperlipidemia   . Morbid obesity ([place])   . Obstructive chronic bronchitis with acute bronchitis ([place])   . Osteopenia, senile   . Osteoporosis   . Other malaise and fatigue   . Other specified disease of white blood cells   . Pain in joint, multiple sites   . Polyneuropathy in diabetes(357.2)   . RA (rheumatoid arthritis) ([place])   . Reflux esophagitis   . Rheumatoid arthritis(714.0)   . Shortness of breath   . Spinal stenosis, lumbar region, without neurogenic claudication   . Spondylosis,  lumbosacral   . Spontaneous ecchymoses   . Stiffness of joints, not elsewhere classified, multiple sites   . Tear film insufficiency, unspecified   . Thyroid disease   . Thyroid disorder   . Type II or unspecified type diabetes mellitus with peripheral circulatory disorders, uncontrolled(250.72)   . Unspecified chronic bronchitis   . Unspecified essential hypertension   . Unspecified hypothyroidism   . Unspecified pruritic disorder   . Unspecified urinary incontinence   . Urinary tract infection, site not specified     Family History  Problem Relation Age of Onset  . Alzheimer's disease Mother   . Heart disease Mother   . Heart disease Father   . Liver disease Father   . Breast cancer Sister   . Colon polyps Brother   . Colon cancer Neg Hx   . Esophageal cancer Neg Hx   . Kidney disease Neg Hx   . Stomach cancer Neg Hx   . Rectal cancer Neg Hx    Past Surgical History:  Procedure Laterality Date  .  ABDOMINAL HYSTERECTOMY  1974  . abdominal tumor  2002  . APPENDECTOMY    . [place]  . BRONCHOSCOPY  2001  . CHOLECYSTECTOMY  1984  . KNEE SURGERY Bilateral 08/27/2010 (L) and 01/11/2011 (R)  . [place]  . OVARIAN CYST SURGERY  1968  . thymus tumor    . 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 TONSILLECTOMY     Social History   Social History Narrative   Walks with cane     Objective: Vital Signs: BP (!) 150/74 (BP Location: Left Arm, Patient Position: Sitting, Cuff Size: Large)   Pulse 92   Resp 13   Ht 5\' 6"  (1.676 m)   Wt 272 lb (123.4 kg)   BMI 43.90 kg/m    Physical Exam  Constitutional: She is oriented to person, place, and time. She appears well-developed and well-nourished.  HENT:  Head: Normocephalic and atraumatic.  Eyes: EOM are normal. Pupils are equal, round, and reactive to light.  Cardiovascular: Normal rate, regular rhythm and normal heart sounds.  Exam reveals no gallop and no friction rub.   No murmur heard. Pulmonary/Chest: Effort  normal and breath sounds normal. She has no wheezes. She has no rales.  Abdominal: Soft. Bowel sounds are normal. She exhibits no distension. There is no tenderness. There is no guarding. No hernia.  Musculoskeletal: Normal range of motion. She exhibits no edema, tenderness or deformity.  Lymphadenopathy:    She has no cervical adenopathy.  Neurological: She is alert and oriented to person, place, and time. Coordination normal.  Skin: Skin is warm and dry. Capillary refill takes less than 2 seconds. No rash noted.  Psychiatric: She has a normal mood and affect. Her behavior is normal.  Nursing note and vitals reviewed.    Musculoskeletal Exam:  Full range of motion of all joints except decrease flexion and extension of hands and wrists. Grip strength is decreased bilaterally Fibromyalgia tender points are all absent but note the patient does have fibromyalgia.  CDAI Exam: CDAI Homunculus Exam:   Tenderness:  LUE: wrist  Swelling:  LUE: wrist  Joint Counts:  CDAI Tender Joint count: 1 CDAI Swollen Joint count: 1     Investigation: No additional findings.   Imaging: No results found.  Speciality Comments: No specialty comments available.    Procedures:  No procedures performed Allergies: Penicillins and Sulfamethoxazole-trimethoprim   Assessment / Plan: Visit Diagnoses: Rheumatoid arteritis - +CCP; +EROSIVE DISEASE;   High risk medications (not anticoagulants) long-term use - XELJANZ 5mg  BID; will start mtx 0.77ml q week this week (Jul 06, 2016) and folic acid 2mg  qd;  - Plan: CBC with Differential/Platelet, COMPLETE METABOLIC PANEL WITH GFR, CBC with Differential/Platelet, Comprehensive metabolic panel  Fibromyalgia  Greater trochanteric bursitis of right hip  Extensor tenosynovitis of left wrist - Plan: Ambulatory referral to Hand Surgery   We will give her prednisone taper 5 mg 4 pills every morning for 7 days; 3 for 7 days; 2 for 7 days; 1 for 7 days; half  for 8 days then stop.  She will coordinate her sugar levels with her PCP and adjust her medication dosage if appropriate  We will refer the patient to Dr. Amedeo Plenty office for her tenosynovitis. He is to call us back if she doesn't hear from our office within the next 1 week.  She has failed to restart her methotrexate on 2 different office visits. I've encouraged the patient to start the methotrexate and she is agreeable. She  will return back in 2 weeks to get her 2 week blood work done. She does have a standing order for the CBC with differential CMP with GFR. She will start 0.6 and also methotrexate every week. She has the medications at home the syringes. She'll also take folic acid 2 pills every morning.  She has enough meds at home at this time and will call us if she needs refills.  Please refer to patient to Dr. Amedeo Plenty for evaluation and treatment of tenosynovitis. She has a risk of having tendon rupture so this needs to be done urgently.  Return to clinic in 3 months for regular follow-up and sooner if there is any issues questions or concerns.  Orders: Orders Placed This Encounter  Procedures  . CBC with Differential/Platelet  . COMPLETE METABOLIC PANEL WITH GFR  . CBC with Differential/Platelet  . Comprehensive metabolic panel  . Ambulatory referral to Hand Surgery   Meds ordered this encounter  Medications  . predniSONE (DELTASONE) 5 MG tablet    Sig: Prednisone 5 mg: Take 4 pills for 7 days first thing in the morning; 3 pills for 7 days; 2 pills for 7 days; one pill for 7 days; half pill for 8 days; then stop. Take exactly as directed. Adjust her sugar levels with medications as advised by her PCP. Since her diabetic. Dispense 74 pills with no refills    Dispense:  74 tablet    Refill:  0    Order Specific Question:   Supervising Provider    Answer:   Bo Merino [id number]    Face-to-face time spent with patient was 40 minutes. 50% of time was spent in counseling and  coordination of care.  Follow-Up Instructions: No Follow-up on file.   Eliezer Lofts, PA-C

## 2016-07-06 ENCOUNTER — Encounter: Payer: Self-pay | Admitting: Rheumatology

## 2016-07-06 ENCOUNTER — Telehealth: Payer: Self-pay | Admitting: *Deleted

## 2016-07-06 ENCOUNTER — Ambulatory Visit (INDEPENDENT_AMBULATORY_CARE_PROVIDER_SITE_OTHER): Payer: Medicare Other | Admitting: Rheumatology

## 2016-07-06 ENCOUNTER — Other Ambulatory Visit: Payer: Self-pay | Admitting: Internal Medicine

## 2016-07-06 VITALS — BP 150/74 | HR 92 | Resp 13 | Ht 66.0 in | Wt 272.0 lb

## 2016-07-06 DIAGNOSIS — M7061 Trochanteric bursitis, right hip: Secondary | ICD-10-CM

## 2016-07-06 DIAGNOSIS — M0520 Rheumatoid vasculitis with rheumatoid arthritis of unspecified site: Secondary | ICD-10-CM

## 2016-07-06 DIAGNOSIS — M65842 Other synovitis and tenosynovitis, left hand: Secondary | ICD-10-CM

## 2016-07-06 DIAGNOSIS — M797 Fibromyalgia: Secondary | ICD-10-CM

## 2016-07-06 DIAGNOSIS — I00 Rheumatic fever without heart involvement: Secondary | ICD-10-CM

## 2016-07-06 DIAGNOSIS — Z79899 Other long term (current) drug therapy: Secondary | ICD-10-CM | POA: Diagnosis not present

## 2016-07-06 DIAGNOSIS — M65832 Other synovitis and tenosynovitis, left forearm: Secondary | ICD-10-CM

## 2016-07-06 LAB — COMPLETE METABOLIC PANEL WITH GFR
ALBUMIN: 4.2 g/dL (ref 3.6–5.1)
ALK PHOS: 104 U/L (ref 33–130)
ALT: 44 U/L — ABNORMAL HIGH (ref 6–29)
AST: 71 U/L — AB (ref 10–35)
BILIRUBIN TOTAL: 0.4 mg/dL (ref 0.2–1.2)
BUN: 16 mg/dL (ref 7–25)
CALCIUM: 9.6 mg/dL (ref 8.6–10.4)
CO2: 26 mmol/L (ref 20–31)
CREATININE: 0.9 mg/dL (ref 0.60–0.93)
Chloride: 101 mmol/L (ref 98–110)
GFR, Est African American: 72 mL/min (ref >=60)
GFR, Est Non African American: 63 mL/min (ref >=60)
Glucose, Bld: 171 mg/dL — ABNORMAL HIGH (ref 65–99)
POTASSIUM: 4.3 mmol/L (ref 3.5–5.3)
Sodium: 138 mmol/L (ref 135–146)
Total Protein: 7 g/dL (ref 6.1–8.1)

## 2016-07-06 LAB — CBC WITH DIFFERENTIAL/PLATELET
BASOS ABS: 0 {cells}/uL (ref 0–200)
Basophils Relative: 0 %
Eosinophils Absolute: 348 cells/uL (ref 15–500)
Eosinophils Relative: 2 %
HEMATOCRIT: 44.6 % (ref 35.0–45.0)
HEMOGLOBIN: 14.6 g/dL (ref 11.7–15.5)
LYMPHS ABS: 1914 {cells}/uL (ref 850–3900)
Lymphocytes Relative: 11 %
MCH: 27.2 pg (ref 27.0–33.0)
MCHC: 32.7 g/dL (ref 32.0–36.0)
MCV: 83.1 fL (ref 80.0–100.0)
MONO ABS: 1044 {cells}/uL — AB (ref 200–950)
MPV: 10.7 fL (ref 7.5–12.5)
Monocytes Relative: 6 %
NEUTROS PCT: 81 %
Neutro Abs: 14094 cells/uL — ABNORMAL HIGH (ref 1500–7800)
Platelets: 361 10*3/uL (ref 140–400)
RBC: 5.37 MIL/uL — AB (ref 3.80–5.10)
RDW: 14.9 % (ref 11.0–15.0)
WBC: 17.4 10*3/uL — AB (ref 3.8–10.8)

## 2016-07-06 MED ORDER — PREDNISONE 5 MG PO TABS: ORAL_TABLET | ORAL | 0 refills | Status: DC

## 2016-07-06 NOTE — Telephone Encounter (Signed)
-----   Message from Eliezer Lofts, [place] sent at 07/06/2016  4:08 PM EST ----- Regarding: Left wrist tenosynovitis, evaluate and treat Refer patient to Dr. Amedeo Plenty Tenosynovitis (severe) to the left dorsal wrist. Patient needs urgent appointment. Please evaluate and treat

## 2016-07-06 NOTE — Telephone Encounter (Signed)
Referral has been placed. 

## 2016-07-07 ENCOUNTER — Other Ambulatory Visit: Payer: Medicare Other

## 2016-07-07 ENCOUNTER — Ambulatory Visit (INDEPENDENT_AMBULATORY_CARE_PROVIDER_SITE_OTHER): Payer: Medicare Other

## 2016-07-07 DIAGNOSIS — Z794 Long term (current) use of insulin: Secondary | ICD-10-CM

## 2016-07-07 DIAGNOSIS — Z23 Encounter for immunization: Secondary | ICD-10-CM | POA: Diagnosis not present

## 2016-07-07 DIAGNOSIS — E1142 Type 2 diabetes mellitus with diabetic polyneuropathy: Secondary | ICD-10-CM

## 2016-07-07 DIAGNOSIS — Z0000 Encounter for general adult medical examination without abnormal findings: Secondary | ICD-10-CM | POA: Diagnosis not present

## 2016-07-07 DIAGNOSIS — R7989 Other specified abnormal findings of blood chemistry: Secondary | ICD-10-CM

## 2016-07-07 LAB — COMPLETE METABOLIC PANEL WITH GFR
ALT: 38 U/L — ABNORMAL HIGH (ref 6–29)
AST: 54 U/L — ABNORMAL HIGH (ref 10–35)
Albumin: 3.9 g/dL (ref 3.6–5.1)
Alkaline Phosphatase: 99 U/L (ref 33–130)
BUN: 20 mg/dL (ref 7–25)
CO2: 28 mmol/L (ref 20–31)
Calcium: 9.3 mg/dL (ref 8.6–10.4)
Chloride: 101 mmol/L (ref 98–110)
Creat: 0.88 mg/dL (ref 0.60–0.93)
GFR, Est African American: 74 mL/min (ref >=60)
GFR, Est Non African American: 64 mL/min (ref >=60)
Glucose, Bld: 200 mg/dL — ABNORMAL HIGH (ref 65–99)
Potassium: 4.4 mmol/L (ref 3.5–5.3)
Sodium: 139 mmol/L (ref 135–146)
Total Bilirubin: 0.5 mg/dL (ref 0.2–1.2)
Total Protein: 6.7 g/dL (ref 6.1–8.1)

## 2016-07-07 NOTE — Progress Notes (Signed)
Quick Notes   Health Maintenance:   Pn13 given; Declines Flu d/t hx of Guillain-Barre Syndrome. Pt will check w/ insurance about out of pocket cost for Shingles and Eye exam.    Abnormal Screen:  BP was elevated @ 160/62. Pt states she has been taking OTC's for resp. Infection/cold.   Patient Concerns:   None   Nurse Concerns:   None

## 2016-07-07 NOTE — Patient Instructions (Signed)
Katherine Delgado , Thank you for taking time to come for your Medicare Wellness Visit. I appreciate your ongoing commitment to your health goals. Please review the following plan we discussed and let me know if I can assist you in the future.   These are the goals we discussed: Goals    None      This is a list of the screening recommended for you and due dates:  Health Maintenance  Topic Date Due  . Eye exam for diabetics  12/20/1950  . Shingles Vaccine  12/19/2000  . Pneumonia vaccines (1 of 2 - PCV13) 12/19/2005  . Flu Shot  03/22/2016  . Complete foot exam   09/27/2016  . Hemoglobin A1C  10/02/2016  . Colon Cancer Screening  02/10/2025  . DEXA scan (bone density measurement)  09/20/2025  . Tetanus Vaccine  05/28/2026  Preventive Care for Adults  A healthy lifestyle and preventive care can promote health and wellness. Preventive health guidelines for adults include the following key practices.  . A routine yearly physical is a good way to check with your health care provider about your health and preventive screening. It is a chance to share any concerns and updates on your health and to receive a thorough exam.  . Visit your dentist for a routine exam and preventive care every 6 months. Brush your teeth twice a day and floss once a day. Good oral hygiene prevents tooth decay and gum disease.  . The frequency of eye exams is based on your age, health, family medical history, use  of contact lenses, and other factors. Follow your health care provider's ecommendations for frequency of eye exams.  . Eat a healthy diet. Foods like vegetables, fruits, whole grains, low-fat dairy products, and lean protein foods contain the nutrients you need without too many calories. Decrease your intake of foods high in solid fats, added sugars, and salt. Eat the right amount of calories for you. Get information about a proper diet from your health care provider, if necessary.  . Regular physical  exercise is one of the most important things you can do for your health. Most adults should get at least 150 minutes of moderate-intensity exercise (any activity that increases your heart rate and causes you to sweat) each week. In addition, most adults need muscle-strengthening exercises on 2 or more days a week.  Silver Sneakers may be a benefit available to you. To determine eligibility, you may visit the website: www.silversneakers.com or contact program at (201)731-7710 Mon-Fri between 8AM-8PM.   . Maintain a healthy weight. The body mass index (BMI) is a screening tool to identify possible weight problems. It provides an estimate of body fat based on height and weight. Your health care provider can find your BMI and can help you achieve or maintain a healthy weight.   For adults 20 years and older: ? A BMI below 18.5 is considered underweight. ? A BMI of 18.5 to 24.9 is normal. ? A BMI of 25 to 29.9 is considered overweight. ? A BMI of 30 and above is considered obese.   . Maintain normal blood lipids and cholesterol levels by exercising and minimizing your intake of saturated fat. Eat a balanced diet with plenty of fruit and vegetables. Blood tests for lipids and cholesterol should begin at age 48 and be repeated every 5 years. If your lipid or cholesterol levels are high, you are over 50, or you are at high risk for heart disease, you may need your  cholesterol levels checked more frequently. Ongoing high lipid and cholesterol levels should be treated with medicines if diet and exercise are not working.  . If you smoke, find out from your health care provider how to quit. If you do not use tobacco, please do not start.  . If you choose to drink alcohol, please do not consume more than 2 drinks per day. One drink is considered to be 12 ounces (355 mL) of beer, 5 ounces (148 mL) of wine, or 1.5 ounces (44 mL) of liquor.  . If you are 13-78 years old, ask your health care provider if you  should take aspirin to prevent strokes.  . Use sunscreen. Apply sunscreen liberally and repeatedly throughout the day. You should seek shade when your shadow is shorter than you. Protect yourself by wearing long sleeves, pants, a wide-brimmed hat, and sunglasses year round, whenever you are outdoors.  . Once a month, do a whole body skin exam, using a mirror to look at the skin on your back. Tell your health care provider of new moles, moles that have irregular borders, moles that are larger than a pencil eraser, or moles that have changed in shape or color.

## 2016-07-07 NOTE — Progress Notes (Signed)
#  1) nonfasting glucose elevated at 171.#2) AST/aLT elevated at 71 and 44 respectively. Advised patient to discuss this with her PCP.  #3) CBC with differential shows elevated white count at 17.4 but this is consistent with patient's white blood cell count 3 weeks ago. Her other labs that are abnormal include RBC neutrophils and monocytes which are also consistent with labs done 3 weeks ago.Send these labs to her PCP.  Note that she is starting methotrexate this week. As a result she'll be taking repeat CBC with differential CMP with GFR 2 weeks. Please send a tickler to yourself to monitor her lab levels on the 2 week labs

## 2016-07-07 NOTE — Progress Notes (Signed)
Subjective:   Katherine Delgado is a 75 y.o. female who presents for an Initial Medicare Annual Wellness Visit.  Review of Systems     Cardiac Risk Factors include: advanced age (>30men, >86 women);diabetes mellitus;family history of premature cardiovascular disease;hypertension;obesity (BMI >30kg/m2);smoking/ tobacco exposure;sedentary lifestyle     Objective:    Today's Vitals   07/07/16 0852  PainSc: 7    There is no height or weight on file to calculate BMI.   Current Medications (verified) Outpatient Encounter Prescriptions as of 07/07/2016  Medication Sig  . amLODipine (NORVASC) 10 MG tablet TAKE 1 TABLET(10 MG) BY MOUTH DAILY  . aspirin EC 81 MG tablet Take 81 mg by mouth daily as needed.   . Cholecalciferol (VITAMIN D) 2000 units tablet Take 2,000 Units by mouth daily.  . furosemide (LASIX) 20 MG tablet TAKE 1 TABLET BY MOUTH TWICE DAILY AS NEEDED FOR 3 POUND WEIGHT GAIN IN ONE DAY OR 5 POUNDS IN ONE WEEK  . gabapentin (NEURONTIN) 600 MG tablet TAKE 1 TABLET BY MOUTH THREE TIMES DAILY  . glucose blood (ACCU-CHEK SMARTVIEW) test strip Check blood sugar 3-4 times daily  . HYDROcodone-acetaminophen (NORCO) 10-325 MG tablet Take one tablet every three hours as needed for pain  . Insulin Glargine (TOUJEO SOLOSTAR) 300 UNIT/ML SOPN Inject 90 Units into the skin at bedtime.   . Insulin Lispro (HUMALOG KWIKPEN) 200 UNIT/ML SOPN Inject 18 Units into the skin 3 (three) times daily before meals.  . Insulin Syringe-Needle U-100 31G X 5/16" 1 ML MISC Use once daily with administration of Insulin DX E11.59  . levothyroxine (SYNTHROID, LEVOTHROID) 137 MCG tablet TAKE 1 TABLET BY MOUTH DAILY BEFORE BREAKFAST ON AN EMPTY STOMACH( LABS OVERDUE)  . lidocaine (LIDODERM) 5 % Apply patch to bilateral knees at bedtime.  Remove & Discard patch within 12 hours or as directed by MD  . lisinopril (PRINIVIL,ZESTRIL) 10 MG tablet TAKE 1 TABLET(10 MG) BY MOUTH TWICE DAILY  . loperamide (IMODIUM A-D) 2 MG  tablet Take 2 mg by mouth 4 (four) times daily as needed for diarrhea or loose stools.  . Melatonin 10 MG CAPS Take 1 capsule by mouth at bedtime  . metoprolol tartrate (LOPRESSOR) 25 MG tablet TAKE 1 TABLET(25 MG) BY MOUTH TWICE DAILY  . nystatin (MYCOSTATIN/NYSTOP) 100000 UNIT/GM POWD APPLY 1 GRAM TOPICALLY TO THE AFFECTED AREA DAILY AS NEEDED FOR ITCHING  . nystatin-triamcinolone (MYCOLOG II) cream Apply 1 application topically 4 (four) times daily as needed (for rash). Apply under abdomen folds twice daily until rash healed  . omega-3 acid ethyl esters (LOVAZA) 1 G capsule TAKE ONE CAPSULE BY MOUTH EVERY DAY  . oxyCODONE (OXY IR/ROXICODONE) 5 MG immediate release tablet Take one tablet every 6 hours as needed for severe pain, Take 2 tablets at bedtime.  . pilocarpine (SALAGEN) 5 MG tablet Take 5 mg by mouth daily. For dry mouth  . polyvinyl alcohol (LIQUIFILM TEARS) 1.4 % ophthalmic solution Place 1 drop into both eyes as needed (for dry eyes).   . potassium chloride (K-DUR) 10 MEQ tablet Take 10 mEq by mouth 2 (two) times daily as needed (Only when taking Lasix).  . predniSONE (DELTASONE) 5 MG tablet Prednisone 5 mg: Take 4 pills for 7 days first thing in the morning; 3 pills for 7 days; 2 pills for 7 days; one pill for 7 days; half pill for 8 days; then stop. Take exactly as directed. Adjust her sugar levels with medications as advised by her PCP. Since  her diabetic. Dispense 74 pills with no refills  . promethazine (PHENERGAN) 12.5 MG tablet TAKE 1 TABLET BY MOUTH AS NEEDED FOR SEVERE NAUSEA AS DIRECTED  . SPIRIVA HANDIHALER 18 MCG inhalation capsule INHALE THE CONTENTS OF 1 CAPSULE VIA HANDIHALER BY MOUTH DAILY FOR BREATHING  . triamterene-hydrochlorothiazide (MAXZIDE-25) 37.5-25 MG tablet GIVE "[name]" 1 TABLET BY MOUTH DAILY FOR BLOOD PRESSURE  . venlafaxine XR (EFFEXOR-XR) 37.5 MG 24 hr capsule TAKE 1 CAPSULE(37.5 MG) BY MOUTH DAILY WITH BREAKFAST  . VICTOZA 18 MG/3ML SOPN ADMINISTER 1.8 MG  UNDER THE SKIN DAILY FOR BLOOD SUGAR  . XELJANZ 5 MG TABS TAKE 1 TABLET BY MOUTH TWICE DAILY  . [DISCONTINUED] levothyroxine (SYNTHROID, LEVOTHROID) 150 MCG tablet TAKE 1 TABLET BY MOUTH 30 MINUTES BEFORE BREAKFAST (Patient not taking: Reported on 07/06/2016)   No facility-administered encounter medications on file as of 07/07/2016.     Allergies (verified) Penicillins and Sulfamethoxazole-trimethoprim   History: Past Medical History:  Diagnosis Date  . Abnormal weight gain   . Abnormality of gait 04/19/2016  . Acute infective polyneuritis ([place]) 2002  . Acute maxillary sinusitis   . Acute sinusitis, unspecified   . Allergic rhinitis due to pollen   . Arthritis   . Back injury   . Candidiasis of skin and nails   . Candidiasis of vulva and vagina   . Chronic pain syndrome   . COPD (chronic obstructive pulmonary disease) ([place])   . Cough   . Degenerative arthritis   . Depressive disorder, not elsewhere classified   . Diabetes mellitus without complication ([place])   . Diaphragmatic hernia without mention of obstruction or gangrene   . Dyslipidemia   . Edema   . Fibromyalgia   . GERD (gastroesophageal reflux disease)   . Guillain-Barre syndrome ([place])   . History of benign thymus tumor   . Hypertension   . Insomnia, unspecified   . Lumbago   . Miscarriage 1962  . Mixed hyperlipidemia   . Morbid obesity ([place])   . Obstructive chronic bronchitis with acute bronchitis ([place])   . Osteopenia, senile   . Osteoporosis   . Other malaise and fatigue   . Other specified disease of white blood cells   . Pain in joint, multiple sites   . Polyneuropathy in diabetes(357.2)   . RA (rheumatoid arthritis) ([place])   . Reflux esophagitis   . Rheumatoid arthritis(714.0)   . Shortness of breath   . Spinal stenosis, lumbar region, without neurogenic claudication   . Spondylosis, lumbosacral   . Spontaneous ecchymoses   . Stiffness of joints, not elsewhere classified, multiple sites   . Tear film  insufficiency, unspecified   . Thyroid disease   . Thyroid disorder   . Type II or unspecified type diabetes mellitus with peripheral circulatory disorders, uncontrolled(250.72)   . Unspecified chronic bronchitis   . Unspecified essential hypertension   . Unspecified hypothyroidism   . Unspecified pruritic disorder   . Unspecified urinary incontinence   . Urinary tract infection, site not specified    Past Surgical History:  Procedure Laterality Date  . ABDOMINAL HYSTERECTOMY  1974  . abdominal tumor  2002  . APPENDECTOMY    . [place]  . BRONCHOSCOPY  2001  . CHOLECYSTECTOMY  1984  . KNEE SURGERY Bilateral 08/27/2010 (L) and 01/11/2011 (R)  . [place]  . OVARIAN CYST SURGERY  1968  . thymus tumor    . 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 TONSILLECTOMY  Family History  Problem Relation Age of Onset  . Alzheimer's disease Mother   . Heart disease Mother   . Heart disease Father   . Liver disease Father   . Breast cancer Sister   . Colon polyps Brother   . Colon cancer Neg Hx   . Esophageal cancer Neg Hx   . Kidney disease Neg Hx   . Stomach cancer Neg Hx   . Rectal cancer Neg Hx    Social History   Occupational History  . Retired    Social History Main Topics  . Smoking status: Former Smoker    Types: Cigarettes    Quit date: 12/07/1979  . Smokeless tobacco: Never Used  . Alcohol use No  . Drug use: No  . Sexual activity: No    Tobacco Counseling Counseling given: Not Answered   Activities of Daily Living In your present state of health, do you have any difficulty performing the following activities: 07/07/2016  Hearing? N  Vision? Y  Difficulty concentrating or making decisions? N  Walking or climbing stairs? Y  Dressing or bathing? N  Doing errands, shopping? N  Preparing Food and eating ? N  Using the Toilet? N  In the past six months, have you accidently leaked urine? Y  Do you have problems with loss of bowel control? N  Managing your  Medications? N  Managing your Finances? N  Housekeeping or managing your Housekeeping? Y  Some recent data might be hidden    Immunizations and Health Maintenance Immunization History  Administered Date(s) Administered  . Influenza-Unspecified 06/10/2011  . Pneumococcal Conjugate-13 07/07/2016  . Pneumococcal Polysaccharide-23 11/21/2003  . Tdap 05/28/2016   There are no preventive care reminders to display for this patient.  Patient Care Team: Gayland Curry, DO as PCP - General (Geriatric Medicine) Bo Merino, MD (Rheumatology) Lorne Skeens, MD as Attending Physician (Endocrinology)  Indicate any recent Medical Services you may have received from other than [facility] providers in the past year (date may be approximate).     Assessment:   This is a routine wellness examination for Katherine Delgado.  Hearing/Vision screen  Visual Acuity Screening   Right eye Left eye Both eyes  Without correction: 20/40-1 20/40-1 20/40-1  With correction:     Comments: Pt is due for eye exam.   Hearing Screening Comments: Unsure of last hearing screen.   Dietary issues and exercise activities discussed: Current Exercise Habits: The patient does not participate in regular exercise at present, Exercise limited by: neurologic condition(s);orthopedic condition(s)  Goals    . Weight (lb) < 250 lb (113.4 kg)          Starting 07/07/16,  I will attempt to decrease my weight and get under 250 lbs.       Depression Screen PHQ 2/9 Scores 07/07/2016 11/27/2015 11/27/2015 08/11/2015 07/24/2014 04/16/2013 03/14/2013  PHQ - 2 Score 0 0 0 0 0 0 0    Fall Risk Fall Risk  07/07/2016 04/04/2016 12/28/2015 11/27/2015 09/28/2015  Falls in the past year? No No No No No    Cognitive Function: MMSE - Mini Mental State Exam 07/07/2016  Orientation to time 5  Orientation to Place 5  Registration 3  Attention/ Calculation 5  Recall 3  Language- name 2 objects 2  Language- repeat 1  Language- follow 3 step command 3    Language- read & follow direction 1  Write a sentence 1  Copy design 1  Total score 30  Screening Tests Health Maintenance  Topic Date Due  . OPHTHALMOLOGY EXAM  08/21/2017 (Originally 12/20/1950)  . ZOSTAVAX  08/21/2017 (Originally 12/19/2000)  . INFLUENZA VACCINE  08/22/2019 (Originally 03/22/2016)  . FOOT EXAM  09/27/2016  . HEMOGLOBIN A1C  10/02/2016  . PNA vac Low Risk Adult (2 of 2 - PPSV23) 07/07/2017  . COLONOSCOPY  02/10/2025  . DEXA SCAN  09/20/2025  . TETANUS/TDAP  05/28/2026      Plan:    I have personally reviewed and addressed the Medicare Annual Wellness questionnaire and have noted the following in the patient's chart:  A. Medical and social history B. Use of alcohol, tobacco or illicit drugs  C. Current medications and supplements D. Functional ability and status E.  Nutritional status F.  Physical activity G. Advance directives H. List of other physicians I.  Hospitalizations, surgeries, and ER visits in previous 12 months J.  [place] to include hearing, vision, cognitive, depression L. Referrals and appointments - none  In addition, I have reviewed and discussed with patient certain preventive protocols, quality metrics, and best practice recommendations. A written personalized care plan for preventive services as well as general preventive health recommendations were provided to patient.  See attached scanned questionnaire for additional information.   Signed,   Allyn Kenner, LPN Health Advisor    I reviewed health advisor's note, was available for consultation and agree with the assessment and plan as written.    Treylin Burtch L. Alvin Diffee, D.O. [facility] [address], [place] [address] Cell Phone (Mon-Fri 8am-5pm):  [phone] On Call:  [phone] & follow prompts after 5pm & weekends Office Phone:  [phone] Office Fax:  [phone]

## 2016-07-08 LAB — HEMOGLOBIN A1C
Hgb A1c MFr Bld: 7.5 % — ABNORMAL HIGH (ref <5.7)
Mean Plasma Glucose: 169 mg/dL

## 2016-07-08 LAB — MICROALBUMIN / CREATININE URINE RATIO
Creatinine, Urine: 122 mg/dL (ref 20–320)
Microalb Creat Ratio: 8 mcg/mg creat (ref <30)
Microalb, Ur: 1 mg/dL

## 2016-07-11 ENCOUNTER — Ambulatory Visit (INDEPENDENT_AMBULATORY_CARE_PROVIDER_SITE_OTHER): Payer: Medicare Other | Admitting: Pharmacotherapy

## 2016-07-11 ENCOUNTER — Encounter: Payer: Self-pay | Admitting: Pharmacotherapy

## 2016-07-11 VITALS — BP 150/60 | HR 103 | Resp 14 | Ht 66.0 in | Wt 273.0 lb

## 2016-07-11 DIAGNOSIS — I10 Essential (primary) hypertension: Secondary | ICD-10-CM

## 2016-07-11 DIAGNOSIS — E1142 Type 2 diabetes mellitus with diabetic polyneuropathy: Secondary | ICD-10-CM

## 2016-07-11 DIAGNOSIS — Z794 Long term (current) use of insulin: Secondary | ICD-10-CM

## 2016-07-11 MED ORDER — INSULIN LISPRO 200 UNIT/ML ~~LOC~~ SOPN: 20.0000 [IU] | PEN_INJECTOR | Freq: Three times a day (TID) | SUBCUTANEOUS | 5 refills | Status: DC

## 2016-07-11 MED ORDER — METOPROLOL TARTRATE 50 MG PO TABS: 50.0000 mg | ORAL_TABLET | Freq: Two times a day (BID) | ORAL | 3 refills | Status: DC

## 2016-07-11 NOTE — Progress Notes (Signed)
#  1: Labs from 07/06/2016 are prior to patient using methotrexate.#2: It is possible that the Morrie Sheldon may be affecting the patient's AST and ALT levels. The patient started on Xeljanz approximately July 2015. Prior to that her liver functions were normal. However, she could have fatty liver disease.#3 her chronic leukocytosis is relatively stable compared to her recent labs.#4 she is due for repeat labs again 2 weeks from her November 15 labs. We can check and see how she is doing at that time and for those labs to you and see how we want to move forward.Mr. Carlyon Shadow, [place]

## 2016-07-11 NOTE — Patient Instructions (Signed)
1.  Increase Toujeo 100 units daily 2.  Increase Humalog 20 units with each meal 3.  Increase Metoprolol 50mg  twice daily

## 2016-07-11 NOTE — Progress Notes (Signed)
  Subjective:    Katherine Delgado is a 75 y.o.white female who presents for follow-up of Type 2 diabetes mellitus.   A1C 7.5% (was 6.6%) Last BP: 150/74, BP today 150/60  "my blood sugars have been terrible" Has had bronchitis. Dr. Estanislado Pandy put her on prednisone.  Having sweats. Reports her BG are now 140's fasting. Making healthy food choices. No routine exercise.  But, she says she has been doing more walking. Had nerve conduction test with Dr. Jannifer Franklin. Has peripheral neuropathy. Was on ABT for a toe wound.  Seeing Dr Sharol Given.  To see him again next week.  Toe is painful. Has peripheral edema. Not sleeping well. Nocturia 2 x per night. No dysuria. Drinks flavored water.  Review of Systems A comprehensive review of systems was negative except for: Constitutional: positive for sweats and not sleeping well Cardiovascular: positive for lower extremity edema Genitourinary: positive for nocturia Musculoskeletal: positive for muscle weakness, stiff joints and toe wound Neurological: positive for peripheral neuropathy Endocrine: positive for diabetic symptoms including blurry vision and poor wound healing    Objective:    BP (!) 150/60   Pulse (!) 103   Resp 14   Ht 5' 6"$  (1.676 m)   Wt 273 lb (123.8 kg)   SpO2 97%   BMI 44.06 kg/m   General:  alert, cooperative and no distress  Oropharynx: normal findings: lips normal without lesions and gums healthy   Eyes:  negative findings: lids and lashes normal and conjunctivae and sclerae normal   Ears:  external ears normal        Lung: clear to auscultation bilaterally  Heart:  regular rate and rhythm     Extremities: edema bilateral lower extremities  Skin: dry     Neuro: mental status, speech normal, alert and oriented x3   Lab Review Glucose, Bld (mg/dL)  Date Value  07/07/2016 200 (H)  07/06/2016 171 (H)  06/16/2016 207 (H)   CO2 (mmol/L)  Date Value  07/07/2016 28  07/06/2016 26  06/16/2016 25   BUN (mg/dL)   Date Value  07/07/2016 20  07/06/2016 16  06/16/2016 15  12/28/2015 24  09/25/2015 20  07/28/2015 20   Creat (mg/dL)  Date Value  07/07/2016 0.88  07/06/2016 0.90  06/16/2016 0.85       Assessment:    Diabetes Mellitus type II, under good control.  A1C above target <7.0% BP above goal <140/90   Plan:    1.  Rx changes: Increase Lantus 100 units daily, Increase Humalog 20 units with each meal, Increase metoprolol 37m twice daily.  2.  Elevated BG related to recent illness and prednisone.  Needs tighter BG control to help wound healing. 3.  BP above target.  See above increase to metoprolol.  Continue lisinopril and amlodipine. 4.  Counseled on nutrition goals. 5.  Exercise goal is 30-45 minutes 5 x week. 6.  Counseled on foot care.

## 2016-07-12 ENCOUNTER — Telehealth: Payer: Self-pay | Admitting: Radiology

## 2016-07-12 NOTE — Telephone Encounter (Signed)
Mr Katherine Delgado has had me call patient, she has a balance at [facility] ortho she has to pay in order to make appt there. I have called her to advise. Left message for her to call me back to discuss

## 2016-07-18 ENCOUNTER — Other Ambulatory Visit: Payer: Self-pay | Admitting: Internal Medicine

## 2016-07-18 ENCOUNTER — Encounter: Payer: Self-pay | Admitting: Internal Medicine

## 2016-07-18 ENCOUNTER — Ambulatory Visit (INDEPENDENT_AMBULATORY_CARE_PROVIDER_SITE_OTHER): Payer: Medicare Other | Admitting: Internal Medicine

## 2016-07-18 VITALS — BP 150/70 | HR 78 | Temp 98.2°F | Wt 269.0 lb

## 2016-07-18 DIAGNOSIS — I10 Essential (primary) hypertension: Secondary | ICD-10-CM

## 2016-07-18 DIAGNOSIS — R5382 Chronic fatigue, unspecified: Secondary | ICD-10-CM

## 2016-07-18 DIAGNOSIS — E11621 Type 2 diabetes mellitus with foot ulcer: Secondary | ICD-10-CM | POA: Diagnosis not present

## 2016-07-18 DIAGNOSIS — Z794 Long term (current) use of insulin: Secondary | ICD-10-CM

## 2016-07-18 DIAGNOSIS — M797 Fibromyalgia: Secondary | ICD-10-CM

## 2016-07-18 DIAGNOSIS — G9332 Myalgic encephalomyelitis/chronic fatigue syndrome: Secondary | ICD-10-CM

## 2016-07-18 DIAGNOSIS — E1142 Type 2 diabetes mellitus with diabetic polyneuropathy: Secondary | ICD-10-CM | POA: Diagnosis not present

## 2016-07-18 DIAGNOSIS — F330 Major depressive disorder, recurrent, mild: Secondary | ICD-10-CM | POA: Diagnosis not present

## 2016-07-18 DIAGNOSIS — M069 Rheumatoid arthritis, unspecified: Secondary | ICD-10-CM

## 2016-07-18 DIAGNOSIS — L97529 Non-pressure chronic ulcer of other part of left foot with unspecified severity: Secondary | ICD-10-CM

## 2016-07-18 LAB — CBC WITH DIFFERENTIAL/PLATELET
Basophils Absolute: 0 cells/uL (ref 0–200)
Basophils Relative: 0 %
Eosinophils Absolute: 211 cells/uL (ref 15–500)
Eosinophils Relative: 1 %
HCT: 44.8 % (ref 35.0–45.0)
Hemoglobin: 14.3 g/dL (ref 11.7–15.5)
Lymphocytes Relative: 7 %
Lymphs Abs: 1477 cells/uL (ref 850–3900)
MCH: 27.1 pg (ref 27.0–33.0)
MCHC: 31.9 g/dL — ABNORMAL LOW (ref 32.0–36.0)
MCV: 85 fL (ref 80.0–100.0)
MPV: 11 fL (ref 7.5–12.5)
Monocytes Absolute: 1266 cells/uL — ABNORMAL HIGH (ref 200–950)
Monocytes Relative: 6 %
Neutro Abs: 18146 cells/uL — ABNORMAL HIGH (ref 1500–7800)
Neutrophils Relative %: 86 %
Platelets: 364 10*3/uL (ref 140–400)
RBC: 5.27 MIL/uL — ABNORMAL HIGH (ref 3.80–5.10)
RDW: 14.8 % (ref 11.0–15.0)
WBC: 21.1 10*3/uL — ABNORMAL HIGH (ref 3.8–10.8)

## 2016-07-18 MED ORDER — DOXYCYCLINE HYCLATE 100 MG PO TABS: 100.0000 mg | ORAL_TABLET | Freq: Two times a day (BID) | ORAL | 0 refills | Status: DC

## 2016-07-18 MED ORDER — VENLAFAXINE HCL ER 75 MG PO CP24: 75.0000 mg | ORAL_CAPSULE | Freq: Every day | ORAL | 3 refills | Status: DC

## 2016-07-18 NOTE — Progress Notes (Signed)
Location:  [facility] clinic Provider:  Ashonti Leandro L. Mariea Clonts, D.O., C.M.D.  Goals of Care:  Advanced Directives 07/07/2016  Does Patient Have a Medical Advance Directive? Yes  Type of Advance Directive -  Does patient want to make changes to medical advance directive? -  Copy of [place] in Chart? Yes  Would patient like information on creating a medical advance directive? -  Pre-existing out of facility DNR order (yellow form or pink MOST form) -     Chief Complaint  Patient presents with  . Medical Management of Chronic Issues    3 mth follow-up    HPI: Patient is a 75 y.o. female seen today for medical management of chronic diseases.    Wound on left foot was getting better, then hurt more yesterday.  Had been going to podiatry, [place] for a different area that healed.  The new area developed and she had cellulitis.  She went to the [facility] ED, got IV abx, dressing orders and then saw Dr. Sharol Given.  Sees him next 12/4.  It had not reached the bone.  Says the toothache feeling she has today feels like maybe she has gout.  There was no evidence of infection at her last visit with Dr. Sharol Given.  She says she's had some drainage from it now that she worries is infected.    DMII:  hba1c was up to 7.5 from 6.6 this month.  Had a bronchitis and was also on prednisone through rheumatology.    Elevated transaminases:  Pt says she took a lot of OTC meds for her bronchitis and she thinks these contributed.    Remains on her Morrie Sheldon.    Says she gets aggravated that she can't do stuff.  She gets tired of pain.  She gets tired of smiling that she has to put on a happy face.  Denies suicidal ideation.    Past Medical History:  Diagnosis Date  . Abnormal weight gain   . Abnormality of gait 04/19/2016  . Acute infective polyneuritis ([place]) 2002  . Acute maxillary sinusitis   . Acute sinusitis, unspecified   . Allergic rhinitis due to pollen   . Arthritis   . Back injury   . Candidiasis  of skin and nails   . Candidiasis of vulva and vagina   . Chronic pain syndrome   . COPD (chronic obstructive pulmonary disease) ([place])   . Cough   . Degenerative arthritis   . Depressive disorder, not elsewhere classified   . Diabetes mellitus without complication ([place])   . Diaphragmatic hernia without mention of obstruction or gangrene   . Dyslipidemia   . Edema   . Fibromyalgia   . GERD (gastroesophageal reflux disease)   . Guillain-Barre syndrome ([place])   . History of benign thymus tumor   . Hypertension   . Insomnia, unspecified   . Lumbago   . Miscarriage 1962  . Mixed hyperlipidemia   . Morbid obesity ([place])   . Obstructive chronic bronchitis with acute bronchitis ([place])   . Osteopenia, senile   . Osteoporosis   . Other malaise and fatigue   . Other specified disease of white blood cells   . Pain in joint, multiple sites   . Polyneuropathy in diabetes(357.2)   . RA (rheumatoid arthritis) ([place])   . Reflux esophagitis   . Rheumatoid arthritis(714.0)   . Shortness of breath   . Spinal stenosis, lumbar region, without neurogenic claudication   . Spondylosis, lumbosacral   .  Spontaneous ecchymoses   . Stiffness of joints, not elsewhere classified, multiple sites   . Tear film insufficiency, unspecified   . Thyroid disease   . Thyroid disorder   . Type II or unspecified type diabetes mellitus with peripheral circulatory disorders, uncontrolled(250.72)   . Unspecified chronic bronchitis   . Unspecified essential hypertension   . Unspecified hypothyroidism   . Unspecified pruritic disorder   . Unspecified urinary incontinence   . Urinary tract infection, site not specified     Past Surgical History:  Procedure Laterality Date  . ABDOMINAL HYSTERECTOMY  1974  . abdominal tumor  2002  . APPENDECTOMY    . [place]  . BRONCHOSCOPY  2001  . CHOLECYSTECTOMY  1984  . KNEE SURGERY Bilateral 08/27/2010 (L) and 01/11/2011 (R)  . [place]  . OVARIAN CYST  SURGERY  1968  . thymus tumor    . 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 TONSILLECTOMY      Allergies  Allergen Reactions  . Penicillins Hives, Itching, Swelling and Rash  . Sulfamethoxazole-Trimethoprim Itching      Medication List       Accurate as of 07/18/16 10:25 AM. Always use your most recent med list.          ACCU-CHEK SMARTVIEW test strip Generic drug:  glucose blood Check blood sugar 3-4 times daily   amLODipine 10 MG tablet Commonly known as:  NORVASC TAKE 1 TABLET(10 MG) BY MOUTH DAILY   aspirin EC 81 MG tablet Take 81 mg by mouth daily as needed.   furosemide 20 MG tablet Commonly known as:  LASIX TAKE 1 TABLET BY MOUTH TWICE DAILY AS NEEDED FOR 3 POUND WEIGHT GAIN IN ONE DAY OR 5 POUNDS IN ONE WEEK   gabapentin 600 MG tablet Commonly known as:  NEURONTIN TAKE 1 TABLET BY MOUTH THREE TIMES DAILY   HYDROcodone-acetaminophen 10-325 MG tablet Commonly known as:  NORCO Take one tablet every three hours as needed for pain   Insulin Lispro 200 UNIT/ML Sopn Commonly known as:  HUMALOG KWIKPEN Inject 20 Units into the skin 3 (three) times daily before meals.   Insulin Syringe-Needle U-100 31G X 5/16" 1 ML Misc Use once daily with administration of Insulin DX E11.59   levothyroxine 137 MCG tablet Commonly known as:  SYNTHROID, LEVOTHROID TAKE 1 TABLET BY MOUTH DAILY BEFORE BREAKFAST ON AN EMPTY STOMACH( LABS OVERDUE)   lidocaine 5 % Commonly known as:  LIDODERM Apply patch to bilateral knees at bedtime.  Remove & Discard patch within 12 hours or as directed by MD   lisinopril 10 MG tablet Commonly known as:  PRINIVIL,ZESTRIL TAKE 1 TABLET(10 MG) BY MOUTH TWICE DAILY   loperamide 2 MG tablet Commonly known as:  IMODIUM A-D Take 2 mg by mouth 4 (four) times daily as needed for diarrhea or loose stools.   Melatonin 10 MG Caps Take 1 capsule by mouth at bedtime   metoprolol 50 MG tablet Commonly known as:  LOPRESSOR Take 1 tablet (50 mg total) by mouth 2  (two) times daily.   nystatin powder Generic drug:  nystatin APPLY 1 GRAM TOPICALLY TO THE AFFECTED AREA DAILY AS NEEDED FOR ITCHING   nystatin-triamcinolone cream Commonly known as:  MYCOLOG II Apply 1 application topically 4 (four) times daily as needed (for rash). Apply under abdomen folds twice daily until rash healed   omega-3 acid ethyl esters 1 g capsule Commonly known as:  LOVAZA TAKE ONE CAPSULE BY MOUTH EVERY  DAY   oxyCODONE 5 MG immediate release tablet Commonly known as:  Oxy IR/ROXICODONE Take one tablet every 6 hours as needed for severe pain, Take 2 tablets at bedtime.   pilocarpine 5 MG tablet Commonly known as:  SALAGEN Take 5 mg by mouth daily. For dry mouth   polyvinyl alcohol 1.4 % ophthalmic solution Commonly known as:  LIQUIFILM TEARS Place 1 drop into both eyes as needed (for dry eyes).   potassium chloride 10 MEQ tablet Commonly known as:  K-DUR Take 10 mEq by mouth 2 (two) times daily as needed (Only when taking Lasix).   predniSONE 5 MG tablet Commonly known as:  DELTASONE Prednisone 5 mg: Take 4 pills for 7 days first thing in the morning; 3 pills for 7 days; 2 pills for 7 days; one pill for 7 days; half pill for 8 days; then stop. Take exactly as directed. Adjust her sugar levels with medications as advised by her PCP. Since her diabetic. Dispense 74 pills with no refills   promethazine 12.5 MG tablet Commonly known as:  PHENERGAN TAKE 1 TABLET BY MOUTH AS NEEDED FOR SEVERE NAUSEA AS DIRECTED   SPIRIVA HANDIHALER 18 MCG inhalation capsule Generic drug:  tiotropium INHALE THE CONTENTS OF 1 CAPSULE VIA HANDIHALER BY MOUTH DAILY FOR BREATHING   TOUJEO SOLOSTAR 300 UNIT/ML Sopn Generic drug:  Insulin Glargine Inject 100 Units into the skin at bedtime.   triamterene-hydrochlorothiazide 37.5-25 MG tablet Commonly known as:  MAXZIDE-25 GIVE "Bethzy" 1 TABLET BY MOUTH DAILY FOR BLOOD PRESSURE   venlafaxine XR 37.5 MG 24 hr capsule Commonly known as:   EFFEXOR-XR TAKE 1 CAPSULE(37.5 MG) BY MOUTH DAILY WITH BREAKFAST   VICTOZA 18 MG/3ML Sopn Generic drug:  liraglutide ADMINISTER 1.8 MG UNDER THE SKIN DAILY FOR BLOOD SUGAR   Vitamin D 2000 units tablet Take 2,000 Units by mouth daily.   XELJANZ 5 MG Tabs Generic drug:  Tofacitinib Citrate TAKE 1 TABLET BY MOUTH TWICE DAILY       Review of Systems:  Review of Systems  Constitutional: Positive for malaise/fatigue. Negative for chills and fever.  HENT: Negative for congestion.   Respiratory: Negative for cough and shortness of breath.   Cardiovascular: Negative for chest pain, palpitations and leg swelling.  Gastrointestinal: Negative for abdominal pain.  Genitourinary: Negative for dysuria.  Musculoskeletal: Positive for back pain, joint pain and myalgias. Negative for falls.  Skin: Negative for itching and rash.       Left great toe ulcer  Neurological: Positive for tingling and sensory change. Negative for dizziness and weakness.  Psychiatric/Behavioral: Positive for depression. Negative for substance abuse and suicidal ideas.    Health Maintenance  Topic Date Due  . OPHTHALMOLOGY EXAM  08/21/2017 (Originally 12/20/1950)  . ZOSTAVAX  08/21/2017 (Originally 12/19/2000)  . INFLUENZA VACCINE  08/22/2019 (Originally 03/22/2016)  . FOOT EXAM  09/27/2016  . HEMOGLOBIN A1C  01/04/2017  . PNA vac Low Risk Adult (2 of 2 - PPSV23) 07/07/2017  . COLONOSCOPY  02/10/2025  . DEXA SCAN  09/20/2025  . TETANUS/TDAP  05/28/2026    Physical Exam: Vitals:   07/18/16 1002  BP: (!) 150/70  Pulse: 78  Temp: 98.2 F (36.8 C)  TempSrc: Oral  SpO2: 96%  Weight: 269 lb (122 kg)   Body mass index is 43.42 kg/m. Physical Exam  Constitutional: She is oriented to person, place, and time. She appears well-developed and well-nourished.  HENT:  Head: Normocephalic and atraumatic.  Neck: Neck supple.  Cardiovascular: Normal  rate, regular rhythm and normal heart sounds.   Pulmonary/Chest:  Effort normal and breath sounds normal.  Abdominal: Soft. Bowel sounds are normal. She exhibits no distension.  Musculoskeletal: Normal range of motion.  Neurological: She is alert and oriented to person, place, and time.  Skin:  Left great toe with callous and clear yellow drainage from ulcer, toe is very erythematous, swollen, warm and tender; also tender down into first MTP/ball of foot where original ulcer was location   Psychiatric: She has a normal mood and affect.   Labs reviewed: Basic Metabolic Panel:  Recent Labs  07/28/15 1041 09/25/15 0858  04/01/16 0900  06/16/16 0834 07/06/16 1456 07/07/16 0830  NA 140  --   < > 142  < > 138 138 139  K 4.2  --   < > 3.9  < > 4.3 4.3 4.4  CL 98  --   < > 104  < > 100 101 101  CO2 24  --   < > 28  < > 25 26 28   GLUCOSE 116*  --   < > 63*  < > 207* 171* 200*  BUN 20  --   < > 22  < > 15 16 20   CREATININE 0.99  --   < > 0.82  < > 0.85 0.90 0.88  CALCIUM 9.6  --   < > 9.4  < > 9.2 9.6 9.3  TSH 0.344* 0.558  --  0.23*  --   --   --   --   < > = values in this interval not displayed. Liver Function Tests:  Recent Labs  06/16/16 0834 07/06/16 1456 07/07/16 0830  AST 50* 71* 54*  ALT 37* 44* 38*  ALKPHOS 105 104 99  BILITOT 0.3 0.4 0.5  PROT 7.1 7.0 6.7  ALBUMIN 4.0 4.2 3.9   No results for input(s): LIPASE, AMYLASE in the last 8760 hours. No results for input(s): AMMONIA in the last 8760 hours. CBC:  Recent Labs  05/28/16 1601 06/16/16 0834 07/06/16 1456  WBC 14.9* 17.3* 17.4*  NEUTROABS 11.3* 12,802* 14,094*  HGB 13.6 14.5 14.6  HCT 42.7 44.6 44.6  MCV 86.1 85.0 83.1  PLT 245 358 361   Lipid Panel:  Recent Labs  04/01/16 0900  CHOL 143  HDL 46  LDLCALC 80  TRIG 85  CHOLHDL 3.1   Lab Results  Component Value Date   HGBA1C 7.5 (H) 07/07/2016    Procedures since last visit: No results found.  Assessment/Plan 1. Diabetic ulcer of left great toe (HCC) - doxycycline (VIBRA-TABS) 100 MG tablet; Take 1  tablet (100 mg total) by mouth 2 (two) times daily.  Dispense: 20 tablet; Refill: 0 - appears infected again - she is to call back later in the week to report if she is not seeing improvement - tried to get her in with Dr. Sharol Given this week, but he is not in the office -her appt is next Monday and she will keep it (one week)  2. Type 2 diabetes mellitus with diabetic polyneuropathy, with long-term current use of insulin (HCC) - hba1c trended up with bronchitis and now on prednisone - not going to help with healing of the ulcer on her foot - cont current therapy  3. Essential hypertension -bp elevated also today, but on prednisone and toe is hurting   4. Chronic fatigue fibromyalgia syndrome -stable, thinks maybe a little better with her prednisone  5. Mild episode of recurrent major depressive disorder (  [place]) -increase effexor XR to 75mg  daily in am due to worsened spirits recently and frustrations with her medical conditions  6. Rheumatoid arthritis involving multiple sites, unspecified rheumatoid factor presence ([place]) -cont xeljanza -she wants to hold off on recheck labs for her liver--she's certain this is due to her meds she took for her bronchitis -there are plans to try her on methotrexate at rheum  Labs/tests ordered:   Orders Placed This Encounter  Procedures  . Sedimentation Rate  . CBC with Differential/Platelet    Next appt:  10/06/2016   Dylon Correa L. Aryon Nham, D.O. [facility] [address], [place] [address] Cell Phone (Mon-Fri 8am-5pm):  [phone] On Call:  [phone] & follow prompts after 5pm & weekends Office Phone:  [phone] Office Fax:  [phone]

## 2016-07-19 LAB — SEDIMENTATION RATE: Sed Rate: 1 mm/hr (ref 0–30)

## 2016-07-21 ENCOUNTER — Encounter: Payer: Self-pay | Admitting: *Deleted

## 2016-07-22 ENCOUNTER — Other Ambulatory Visit: Payer: Self-pay | Admitting: *Deleted

## 2016-07-22 DIAGNOSIS — R5382 Chronic fatigue, unspecified: Secondary | ICD-10-CM

## 2016-07-22 DIAGNOSIS — M797 Fibromyalgia: Secondary | ICD-10-CM

## 2016-07-22 DIAGNOSIS — M545 Low back pain: Principal | ICD-10-CM

## 2016-07-22 DIAGNOSIS — G9332 Myalgic encephalomyelitis/chronic fatigue syndrome: Secondary | ICD-10-CM

## 2016-07-22 DIAGNOSIS — G8929 Other chronic pain: Secondary | ICD-10-CM

## 2016-07-22 MED ORDER — HYDROCODONE-ACETAMINOPHEN 10-325 MG PO TABS
ORAL_TABLET | ORAL | 0 refills | Status: DC
Start: 2016-07-22 — End: 2016-08-23

## 2016-07-22 MED ORDER — OXYCODONE HCL 5 MG PO TABS
ORAL_TABLET | ORAL | 0 refills | Status: DC
Start: 2016-07-22 — End: 2016-08-23

## 2016-07-22 NOTE — Telephone Encounter (Signed)
Patient requested and will pick up 

## 2016-07-25 ENCOUNTER — Other Ambulatory Visit: Payer: Self-pay

## 2016-07-25 ENCOUNTER — Encounter (INDEPENDENT_AMBULATORY_CARE_PROVIDER_SITE_OTHER): Payer: Self-pay | Admitting: Orthopedic Surgery

## 2016-07-25 ENCOUNTER — Other Ambulatory Visit: Payer: Self-pay | Admitting: Internal Medicine

## 2016-07-25 ENCOUNTER — Ambulatory Visit (INDEPENDENT_AMBULATORY_CARE_PROVIDER_SITE_OTHER): Payer: Medicare Other | Admitting: Orthopedic Surgery

## 2016-07-25 VITALS — Ht 66.0 in | Wt 269.0 lb

## 2016-07-25 DIAGNOSIS — E1142 Type 2 diabetes mellitus with diabetic polyneuropathy: Secondary | ICD-10-CM | POA: Diagnosis not present

## 2016-07-25 DIAGNOSIS — L97524 Non-pressure chronic ulcer of other part of left foot with necrosis of bone: Secondary | ICD-10-CM

## 2016-07-25 MED ORDER — DOXYCYCLINE HYCLATE 100 MG PO TABS: 100.0000 mg | ORAL_TABLET | Freq: Two times a day (BID) | ORAL | 0 refills | Status: DC

## 2016-07-25 MED ORDER — INSULIN GLARGINE 300 UNIT/ML ~~LOC~~ SOPN: 100.0000 [IU] | PEN_INJECTOR | Freq: Every day | SUBCUTANEOUS | 6 refills | Status: DC

## 2016-07-25 NOTE — Telephone Encounter (Signed)
A refill request came from [facility] that requested a refill of Toujeo. Prescription was sent electronically to pharmacy.

## 2016-07-25 NOTE — Progress Notes (Signed)
Office Visit Note   Patient: Katherine Delgado           Date of Birth: 24-Sep-1940           MRN: [id number] Visit Date: 07/25/2016              Requested by: Gayland Curry, DO [place], [place] [address] PCP: Hollace Kinnier, DO   Assessment & Plan: Visit Diagnoses:  1. Ulcer of toe of left foot, with necrosis of bone ([place])   2. Diabetic polyneuropathy associated with type 2 diabetes mellitus ([place])     Plan: Patient now has a Wagner grade 3 ulcer goes down to bone of the proximal phalanx left great toe. The ulcer has been opened up it is very small about 5 mm in diameter and 5 mm deep. We will refill her prescription for doxycycline patient is not interested in surgical this time she does have heel cord contracture as well with a callus beneath the great toe first metatarsal head. Discussed with the redness extends proximal to the MTP joint we would need to urgently consider amputation of great toe otherwise plan for follow-up in 4 weeks after the holidays and scheduled for an amputation left great toe at the MTP joint as well as gastroc recession on the left.  Follow-Up Instructions: Return in about 4 weeks (around 08/22/2016).   Orders:  No orders of the defined types were placed in this encounter.  Meds ordered this encounter  Medications  . doxycycline (VIBRA-TABS) 100 MG tablet    Sig: Take 1 tablet (100 mg total) by mouth 2 (two) times daily.    Dispense:  60 tablet    Refill:  0      Procedures: No procedures performed   Clinical Data: No additional findings.   Subjective: Chief Complaint  Patient presents with  . Left Foot - Follow-up    Patient presents today for left great toe plantar ulceration. She states it was acting up again and became reinfected and was placed on doxycycline again by her primary care. She does normally wear post op shoe. She feels like with the felt relieving pad this is putting too much pressure on forefoot callus. Her redness has  resolved per patient report since last Monday. She complains of drainage when expressing toe. She leaves dressing off during the evening, 'so it will get some air'. There is no odor.     Review of Systems   Objective: Vital Signs: Ht 5\' 6"  (1.676 m)   Wt 269 lb (122 kg)   BMI 43.42 kg/m   Physical Exam On examination patient is alert oriented no adenopathy well-dressed normal affect arrest ref which she does have an antalgic gait. Examination the left foot she does have some venous stasis swelling but no venous ulcers she has palpable pulses she does not have protective sensation she has Wagner grade 1 ulcer which is larger beneath the left great toe proximal phalanx. After informed consent a 10 blade knife was used to debride the skin and soft tissue back to bleeding viable granulation tissue this was touched with silver nitrate. The ulcers 5 mm in diameter 5 mm deep and now probes all the way down to bone. There is redness of the great toe there is wrinkling no sausage digit swelling of the redness does not extend proximal to the MTP joint. Ortho Exam  Specialty Comments:  No specialty comments available.  Imaging: No results found.   [place]  History: Patient Active Problem List   Diagnosis Date Noted  . Ulcer of toe of left foot, with necrosis of bone ([place]) 07/25/2016  . Rheumatoid arthritis with positive rheumatoid factor ([place]) 06/14/2016  . High risk medication use 06/14/2016  . Sjogren's syndrome ([place]) 06/14/2016  . Primary osteoarthritis of both knees 06/14/2016  . Osteoarthritis of lumbar spine 06/14/2016  . Hypothyroidism 06/14/2016  . Osteoporosis 06/14/2016  . Abnormality of gait 04/19/2016  . Type 2 diabetes mellitus with diabetic polyneuropathy, with long-term current use of insulin ([place]) 11/27/2015  . Chronic fatigue fibromyalgia syndrome 11/27/2015  . Severe obesity (BMI >= 40) ([place]) 04/21/2014  . Type II or unspecified type diabetes mellitus with peripheral  circulatory disorders, uncontrolled(250.72) 09/12/2013  . Osteopenia, senile   . Headache(784.0) 07/31/2013  . Sinus infection 07/31/2013  . Chronic low back pain 03/14/2013  . Insomnia 12/06/2012  . Nausea alone 12/06/2012  . Diarrhea 12/06/2012  . Urinary incontinence, urge 12/06/2012  . Lumbosacral root lesions, not elsewhere classified 11/27/2012  . Diabetic polyneuropathy associated with type 2 diabetes mellitus ([place]) 11/27/2012  . EDEMA 05/04/2010  . YEAST INFECTION 05/03/2010  . DIABETES MELLITUS 05/03/2010  . Hyperlipidemia 05/03/2010  . OBESITY 05/03/2010  . DEPRESSION 05/03/2010  . PERIPHERAL NEUROPATHY 05/03/2010  . GUILLAIN-BARRE SYNDROME 05/03/2010  . HYPERTENSION 05/03/2010  . ALLERGIC RHINITIS 05/03/2010  . PNEUMONIA 05/03/2010  . COPD 05/03/2010  . GERD 05/03/2010  . Fibromyalgia 05/03/2010  . DYSPNEA 05/03/2010  . CHEST PAIN 05/03/2010   Past Medical History:  Diagnosis Date  . Abnormal weight gain   . Abnormality of gait 04/19/2016  . Acute infective polyneuritis ([place]) 2002  . Acute maxillary sinusitis   . Acute sinusitis, unspecified   . Allergic rhinitis due to pollen   . Arthritis   . Back injury   . Candidiasis of skin and nails   . Candidiasis of vulva and vagina   . Chronic pain syndrome   . COPD (chronic obstructive pulmonary disease) ([place])   . Cough   . Degenerative arthritis   . Depressive disorder, not elsewhere classified   . Diabetes mellitus without complication ([place])   . Diaphragmatic hernia without mention of obstruction or gangrene   . Dyslipidemia   . Edema   . Fibromyalgia   . GERD (gastroesophageal reflux disease)   . Guillain-Barre syndrome ([place])   . History of benign thymus tumor   . Hypertension   . Insomnia, unspecified   . Lumbago   . Miscarriage 1962  . Mixed hyperlipidemia   . Morbid obesity ([place])   . Obstructive chronic bronchitis with acute bronchitis ([place])   . Osteopenia, senile   . Osteoporosis   . Other  malaise and fatigue   . Other specified disease of white blood cells   . Pain in joint, multiple sites   . Polyneuropathy in diabetes(357.2)   . RA (rheumatoid arthritis) ([place])   . Reflux esophagitis   . Rheumatoid arthritis(714.0)   . Shortness of breath   . Spinal stenosis, lumbar region, without neurogenic claudication   . Spondylosis, lumbosacral   . Spontaneous ecchymoses   . Stiffness of joints, not elsewhere classified, multiple sites   . Tear film insufficiency, unspecified   . Thyroid disease   . Thyroid disorder   . Type II or unspecified type diabetes mellitus with peripheral circulatory disorders, uncontrolled(250.72)   . Unspecified chronic bronchitis   . Unspecified essential hypertension   . Unspecified hypothyroidism   . Unspecified pruritic disorder   .  Unspecified urinary incontinence   . Urinary tract infection, site not specified     Family History  Problem Relation Age of Onset  . Alzheimer's disease Mother   . Heart disease Mother   . Heart disease Father   . Liver disease Father   . Breast cancer Sister   . Colon polyps Brother   . Colon cancer Neg Hx   . Esophageal cancer Neg Hx   . Kidney disease Neg Hx   . Stomach cancer Neg Hx   . Rectal cancer Neg Hx     Past Surgical History:  Procedure Laterality Date  . ABDOMINAL HYSTERECTOMY  1974  . abdominal tumor  2002  . APPENDECTOMY    . [place]  . BRONCHOSCOPY  2001  . CHOLECYSTECTOMY  1984  . KNEE SURGERY Bilateral 08/27/2010 (L) and 01/11/2011 (R)  . [place]  . OVARIAN CYST SURGERY  1968  . thymus tumor    . 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 TONSILLECTOMY     Social History   Occupational History  . Retired    Social History Main Topics  . Smoking status: Former Smoker    Types: Cigarettes    Quit date: 12/07/1979  . Smokeless tobacco: Never Used  . Alcohol use No  . Drug use: No  . Sexual activity: No

## 2016-08-03 ENCOUNTER — Other Ambulatory Visit: Payer: Self-pay | Admitting: Internal Medicine

## 2016-08-08 ENCOUNTER — Other Ambulatory Visit: Payer: Self-pay | Admitting: Internal Medicine

## 2016-08-14 ENCOUNTER — Other Ambulatory Visit: Payer: Self-pay | Admitting: Internal Medicine

## 2016-08-17 ENCOUNTER — Other Ambulatory Visit: Payer: Self-pay | Admitting: Internal Medicine

## 2016-08-23 ENCOUNTER — Ambulatory Visit (INDEPENDENT_AMBULATORY_CARE_PROVIDER_SITE_OTHER): Payer: Medicare Other | Admitting: Orthopedic Surgery

## 2016-08-23 ENCOUNTER — Telehealth: Payer: Self-pay

## 2016-08-23 ENCOUNTER — Other Ambulatory Visit: Payer: Self-pay | Admitting: *Deleted

## 2016-08-23 VITALS — Ht 66.0 in | Wt 269.0 lb

## 2016-08-23 DIAGNOSIS — R5382 Chronic fatigue, unspecified: Secondary | ICD-10-CM

## 2016-08-23 DIAGNOSIS — L97524 Non-pressure chronic ulcer of other part of left foot with necrosis of bone: Secondary | ICD-10-CM | POA: Diagnosis not present

## 2016-08-23 DIAGNOSIS — G9332 Myalgic encephalomyelitis/chronic fatigue syndrome: Secondary | ICD-10-CM

## 2016-08-23 DIAGNOSIS — G8929 Other chronic pain: Secondary | ICD-10-CM

## 2016-08-23 DIAGNOSIS — M545 Low back pain: Principal | ICD-10-CM

## 2016-08-23 DIAGNOSIS — M797 Fibromyalgia: Secondary | ICD-10-CM

## 2016-08-23 MED ORDER — HYDROCODONE-ACETAMINOPHEN 10-325 MG PO TABS: ORAL_TABLET | ORAL | 0 refills | Status: DC

## 2016-08-23 MED ORDER — OXYCODONE HCL 5 MG PO TABS: ORAL_TABLET | ORAL | 0 refills | Status: DC

## 2016-08-23 NOTE — Telephone Encounter (Signed)
I spoke with patient to let her know that she has 2 prescriptions ready to be picked up at the office. Prescriptions are for hydrocodone/APAP 10-325 mg, # 240 and oxycodone 5 mg, # 180.   Both prescriptions were placed in the filing cabinet at front desk.

## 2016-08-23 NOTE — Telephone Encounter (Signed)
Patient requested and will pick up 

## 2016-08-23 NOTE — Progress Notes (Signed)
Office Visit Note   Patient: Katherine Delgado           Date of Birth: 1940-11-20           MRN: [id number] Visit Date: 08/23/2016              Requested by: Gayland Curry, DO [place], [place] [address] PCP: Hollace Kinnier, DO   Assessment & Plan: Visit Diagnoses:  1. Ulcer of toe of left foot, with necrosis of bone ([place])     Plan: Plan to obtain her extra-depth shoes with custom orthotics. Continue with wound care. We will follow up as needed. Patient is given instructions for how to treat her ingrown toenails.  Follow-Up Instructions: Return if symptoms worsen or fail to improve.   Orders:  No orders of the defined types were placed in this encounter.  No orders of the defined types were placed in this encounter.     Procedures: No procedures performed   Clinical Data: No additional findings.   Subjective: Chief Complaint  Patient presents with  . Left Foot - Follow-up    Left great toe ulcer    Patient presents in follow-up for Wagner grade 1 ulcer left great toe plantar IP joint. Patient is currently in a postoperative shoe wearing a felt living donor using triple antibiotic ointment she states she feels much better.    Review of Systems   Objective: Vital Signs: Ht 5\' 6"  (1.676 m)   Wt 269 lb (122 kg)   BMI 43.42 kg/m   Physical Exam examination patient is alert oriented no adenopathy well-dressed normal affect numerous [place] she has a normal gait. She has good dorsalis ankle dorsiflexion past neutral she has a palpable dorsalis pedis pulse bilaterally. She has no ulcerations in the right foot she does have venous stasis changes bilaterally with varicose veins she has a Wagner grade 1 ulcer beneath the left great toe IP joint. After informed consent a 10 blade knife was used to debride the skin and soft tissue back to healthy viable granulation tissue this did not probe to bone or tendon. The ulcers 10 mm in diameter 1 mm deep. There is no  cellulitis.  Ortho Exam  Specialty Comments:  No specialty comments available.  Imaging: No results found.   PMFS History: Patient Active Problem List   Diagnosis Date Noted  . Ulcer of toe of left foot, with necrosis of bone ([place]) 07/25/2016  . Rheumatoid arthritis with positive rheumatoid factor ([place]) 06/14/2016  . High risk medication use 06/14/2016  . Sjogren's syndrome ([place]) 06/14/2016  . Primary osteoarthritis of both knees 06/14/2016  . Osteoarthritis of lumbar spine 06/14/2016  . Hypothyroidism 06/14/2016  . Osteoporosis 06/14/2016  . Abnormality of gait 04/19/2016  . Type 2 diabetes mellitus with diabetic polyneuropathy, with long-term current use of insulin ([place]) 11/27/2015  . Chronic fatigue fibromyalgia syndrome 11/27/2015  . Severe obesity (BMI >= 40) ([place]) 04/21/2014  . Type II or unspecified type diabetes mellitus with peripheral circulatory disorders, uncontrolled(250.72) 09/12/2013  . Osteopenia, senile   . Headache(784.0) 07/31/2013  . Sinus infection 07/31/2013  . Chronic low back pain 03/14/2013  . Insomnia 12/06/2012  . Nausea alone 12/06/2012  . Diarrhea 12/06/2012  . Urinary incontinence, urge 12/06/2012  . Lumbosacral root lesions, not elsewhere classified 11/27/2012  . Diabetic polyneuropathy associated with type 2 diabetes mellitus ([place]) 11/27/2012  . EDEMA 05/04/2010  . YEAST INFECTION 05/03/2010  . DIABETES MELLITUS 05/03/2010  .  Hyperlipidemia 05/03/2010  . OBESITY 05/03/2010  . DEPRESSION 05/03/2010  . PERIPHERAL NEUROPATHY 05/03/2010  . GUILLAIN-BARRE SYNDROME 05/03/2010  . HYPERTENSION 05/03/2010  . ALLERGIC RHINITIS 05/03/2010  . PNEUMONIA 05/03/2010  . COPD 05/03/2010  . GERD 05/03/2010  . Fibromyalgia 05/03/2010  . DYSPNEA 05/03/2010  . CHEST PAIN 05/03/2010   Past Medical History:  Diagnosis Date  . Abnormal weight gain   . Abnormality of gait 04/19/2016  . Acute infective polyneuritis ([place]) 2002  . Acute maxillary  sinusitis   . Acute sinusitis, unspecified   . Allergic rhinitis due to pollen   . Arthritis   . Back injury   . Candidiasis of skin and nails   . Candidiasis of vulva and vagina   . Chronic pain syndrome   . COPD (chronic obstructive pulmonary disease) ([place])   . Cough   . Degenerative arthritis   . Depressive disorder, not elsewhere classified   . Diabetes mellitus without complication ([place])   . Diaphragmatic hernia without mention of obstruction or gangrene   . Dyslipidemia   . Edema   . Fibromyalgia   . GERD (gastroesophageal reflux disease)   . Guillain-Barre syndrome ([place])   . History of benign thymus tumor   . Hypertension   . Insomnia, unspecified   . Lumbago   . Miscarriage 1962  . Mixed hyperlipidemia   . Morbid obesity ([place])   . Obstructive chronic bronchitis with acute bronchitis ([place])   . Osteopenia, senile   . Osteoporosis   . Other malaise and fatigue   . Other specified disease of white blood cells   . Pain in joint, multiple sites   . Polyneuropathy in diabetes(357.2)   . RA (rheumatoid arthritis) ([place])   . Reflux esophagitis   . Rheumatoid arthritis(714.0)   . Shortness of breath   . Spinal stenosis, lumbar region, without neurogenic claudication   . Spondylosis, lumbosacral   . Spontaneous ecchymoses   . Stiffness of joints, not elsewhere classified, multiple sites   . Tear film insufficiency, unspecified   . Thyroid disease   . Thyroid disorder   . Type II or unspecified type diabetes mellitus with peripheral circulatory disorders, uncontrolled(250.72)   . Unspecified chronic bronchitis   . Unspecified essential hypertension   . Unspecified hypothyroidism   . Unspecified pruritic disorder   . Unspecified urinary incontinence   . Urinary tract infection, site not specified     Family History  Problem Relation Age of Onset  . Alzheimer's disease Mother   . Heart disease Mother   . Heart disease Father   . Liver disease Father   . Breast  cancer Sister   . Colon polyps Brother   . Colon cancer Neg Hx   . Esophageal cancer Neg Hx   . Kidney disease Neg Hx   . Stomach cancer Neg Hx   . Rectal cancer Neg Hx     Past Surgical History:  Procedure Laterality Date  . ABDOMINAL HYSTERECTOMY  1974  . abdominal tumor  2002  . APPENDECTOMY    . [place]  . BRONCHOSCOPY  2001  . CHOLECYSTECTOMY  1984  . KNEE SURGERY Bilateral 08/27/2010 (L) and 01/11/2011 (R)  . [place]  . OVARIAN CYST SURGERY  1968  . thymus tumor    . 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 TONSILLECTOMY     Social History   Occupational History  . Retired    Social History Main Topics  . Smoking status:  Former Smoker    Types: Cigarettes    Quit date: 12/07/1979  . Smokeless tobacco: Never Used  . Alcohol use No  . Drug use: No  . Sexual activity: No

## 2016-08-29 ENCOUNTER — Ambulatory Visit (INDEPENDENT_AMBULATORY_CARE_PROVIDER_SITE_OTHER): Payer: Medicare Other | Admitting: Internal Medicine

## 2016-08-29 ENCOUNTER — Encounter: Payer: Self-pay | Admitting: Internal Medicine

## 2016-08-29 VITALS — BP 158/80 | HR 94 | Temp 98.0°F | Wt 280.0 lb

## 2016-08-29 DIAGNOSIS — R635 Abnormal weight gain: Secondary | ICD-10-CM | POA: Diagnosis not present

## 2016-08-29 DIAGNOSIS — M47816 Spondylosis without myelopathy or radiculopathy, lumbar region: Secondary | ICD-10-CM

## 2016-08-29 DIAGNOSIS — L97529 Non-pressure chronic ulcer of other part of left foot with unspecified severity: Secondary | ICD-10-CM | POA: Diagnosis not present

## 2016-08-29 DIAGNOSIS — E11621 Type 2 diabetes mellitus with foot ulcer: Secondary | ICD-10-CM | POA: Diagnosis not present

## 2016-08-29 DIAGNOSIS — R5382 Chronic fatigue, unspecified: Secondary | ICD-10-CM | POA: Diagnosis not present

## 2016-08-29 DIAGNOSIS — Z794 Long term (current) use of insulin: Secondary | ICD-10-CM

## 2016-08-29 DIAGNOSIS — I872 Venous insufficiency (chronic) (peripheral): Secondary | ICD-10-CM

## 2016-08-29 DIAGNOSIS — E1142 Type 2 diabetes mellitus with diabetic polyneuropathy: Secondary | ICD-10-CM

## 2016-08-29 DIAGNOSIS — I509 Heart failure, unspecified: Secondary | ICD-10-CM

## 2016-08-29 DIAGNOSIS — M797 Fibromyalgia: Secondary | ICD-10-CM

## 2016-08-29 DIAGNOSIS — G9332 Myalgic encephalomyelitis/chronic fatigue syndrome: Secondary | ICD-10-CM

## 2016-08-29 MED ORDER — FUROSEMIDE 20 MG PO TABS: 40.0000 mg | ORAL_TABLET | Freq: Two times a day (BID) | ORAL | 5 refills | Status: DC

## 2016-08-29 NOTE — Patient Instructions (Addendum)
Continue to weigh yourself daily. Elevate your feet at rest Get the compression hose Dr. Sharol Given recommended Increase your lasix to 2 tablets (40mg  total) twice a day until your weight is back down to 269 lbs Increase your potassium to 2 tablets twice a day while taking the increased lasix. We will set your up for an echo with Dr. Jacalyn Lefevre office.

## 2016-08-29 NOTE — Progress Notes (Signed)
Location:  [facility] clinic Provider:  [name] L. Mariea Delgado, D.O., C.M.D.  Code Status: DNR Goals of Care:  Advanced Directives 07/07/2016  Does Patient Have a Medical Advance Directive? Yes  Type of Advance Directive -  Does patient want to make changes to medical advance directive? -  Copy of [place] in Chart? Yes  Would patient like information on creating a medical advance directive? -  Pre-existing out of facility DNR order (yellow form or pink MOST form) -  MOST reviewed again today.  Originally done 01/16/14 and no changes today, signature on back of sheet done.    Chief Complaint  Patient presents with  . Follow-up    6 week on toe    HPI: Patient is a 76 y.o. female seen today for 6 wk f/u on infected toe ulcer. Since I saw her, she followed up with Katherine Delgado who reported that her Katherine Delgado grade 3 ulcer that goes down to the bone of the proximal phalanx of the left great toe.  Her doxy Rx was refilled.  She also had a heel cord contracture with callus beneath the great toe first metatarsal head.  She was counseled that if redness progressed up beyond the MTP joint, she would require urgent great toe amputation.  She was otherwise to f/u in 4 wks.  She then saw him again 08/23/16 and plan was extra depth shoes with custom orthotics, continued wound care, taught how to manage her own toenails.  Return if worsens.  The ulcer was debrided that day.    Both feet are swollen, left greater than right.  They were worse 3 days ago.  They hurt all thru the metatarsal areas like they'd split open.  She has not eaten salty foods.  Taking her fluid pills and potassium.  Says they've also had periods of redness.  Trying to elevate them.  Says they were blood red yesterday, tight and they hurt like they'd rupture.  No increase in sob.  Has chronic palpitations that come and go.  Has been taking lasix 20mg  po bid since last Friday  (so three days).  Is having increased urinary frequency,  urgency and incomplete emptying.  Her brother has had bladder cancer and she's worrying about it now.  Past Medical History:  Diagnosis Date  . Abnormal weight gain   . Abnormality of gait 04/19/2016  . Acute infective polyneuritis ([place]) 2002  . Acute maxillary sinusitis   . Acute sinusitis, unspecified   . Allergic rhinitis due to pollen   . Arthritis   . Back injury   . Candidiasis of skin and nails   . Candidiasis of vulva and vagina   . Chronic pain syndrome   . COPD (chronic obstructive pulmonary disease) ([place])   . Cough   . Degenerative arthritis   . Depressive disorder, not elsewhere classified   . Diabetes mellitus without complication ([place])   . Diaphragmatic hernia without mention of obstruction or gangrene   . Dyslipidemia   . Edema   . Fibromyalgia   . GERD (gastroesophageal reflux disease)   . Guillain-Barre syndrome ([place])   . History of benign thymus tumor   . Hypertension   . Insomnia, unspecified   . Lumbago   . Miscarriage 1962  . Mixed hyperlipidemia   . Morbid obesity ([place])   . Obstructive chronic bronchitis with acute bronchitis ([place])   . Osteopenia, senile   . Osteoporosis   . Other malaise and fatigue   .  Other specified disease of white blood cells   . Pain in joint, multiple sites   . Polyneuropathy in diabetes(357.2)   . RA (rheumatoid arthritis) ([place])   . Reflux esophagitis   . Rheumatoid arthritis(714.0)   . Shortness of breath   . Spinal stenosis, lumbar region, without neurogenic claudication   . Spondylosis, lumbosacral   . Spontaneous ecchymoses   . Stiffness of joints, not elsewhere classified, multiple sites   . Tear film insufficiency, unspecified   . Thyroid disease   . Thyroid disorder   . Type II or unspecified type diabetes mellitus with peripheral circulatory disorders, uncontrolled(250.72)   . Unspecified chronic bronchitis   . Unspecified essential hypertension   . Unspecified hypothyroidism   . Unspecified pruritic  disorder   . Unspecified urinary incontinence   . Urinary tract infection, site not specified     Past Surgical History:  Procedure Laterality Date  . ABDOMINAL HYSTERECTOMY  1974  . abdominal tumor  2002  . APPENDECTOMY    . [place]  . BRONCHOSCOPY  2001  . CHOLECYSTECTOMY  1984  . KNEE SURGERY Bilateral 08/27/2010 (L) and 01/11/2011 (R)  . [place]  . OVARIAN CYST SURGERY  1968  . thymus tumor    . 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 TONSILLECTOMY      Allergies  Allergen Reactions  . Penicillins Hives, Itching, Swelling and Rash  . Sulfamethoxazole-Trimethoprim Itching    Allergies as of 08/29/2016      Reactions   Penicillins Hives, Itching, Swelling, Rash   Sulfamethoxazole-trimethoprim Itching      Medication List       Accurate as of 08/29/16  1:39 PM. Always use your most recent med list.          ACCU-CHEK SMARTVIEW test strip Generic drug:  glucose blood Check blood sugar 3-4 times daily   amLODipine 10 MG tablet Commonly known as:  NORVASC TAKE 1 TABLET(10 MG) BY MOUTH DAILY   aspirin EC 81 MG tablet Take 81 mg by mouth daily as needed.   furosemide 20 MG tablet Commonly known as:  LASIX TAKE 1 TABLET BY MOUTH TWICE DAILY AS NEEDED FOR 3 POUND WEIGHT GAIN IN ONE DAY OR 5 POUNDS IN ONE WEEK   gabapentin 600 MG tablet Commonly known as:  NEURONTIN TAKE 1 TABLET BY MOUTH THREE TIMES DAILY   HYDROcodone-acetaminophen 10-325 MG tablet Commonly known as:  NORCO Take one tablet every three hours as needed for pain   Insulin Glargine 300 UNIT/ML Sopn Commonly known as:  TOUJEO SOLOSTAR Inject 100 Units into the skin at bedtime.   Insulin Lispro 200 UNIT/ML Sopn Commonly known as:  HUMALOG KWIKPEN Inject 20 Units into the skin 3 (three) times daily before meals.   Insulin Syringe-Needle U-100 31G X 5/16" 1 ML Misc Use once daily with administration of Insulin DX E11.59   levothyroxine 137 MCG tablet Commonly known as:  SYNTHROID,  LEVOTHROID TAKE 1 TABLET BY MOUTH DAILY BEFORE BREAKFAST ON AN EMPTY STOMACH( LABS OVERDUE)   lisinopril 10 MG tablet Commonly known as:  PRINIVIL,ZESTRIL TAKE 1 TABLET(10 MG) BY MOUTH TWICE DAILY   loperamide 2 MG tablet Commonly known as:  IMODIUM A-D Take 2 mg by mouth 4 (four) times daily as needed for diarrhea or loose stools.   Melatonin 10 MG Caps Take 1 capsule by mouth at bedtime   metoprolol 50 MG tablet Commonly known as:  LOPRESSOR Take 1 tablet (50 mg total)  by mouth 2 (two) times daily.   nystatin powder Generic drug:  nystatin APPLY 1 GRAM TOPICALLY TO THE AFFECTED AREA DAILY AS NEEDED FOR ITCHING   nystatin-triamcinolone cream Commonly known as:  MYCOLOG II Apply 1 application topically 4 (four) times daily as needed (for rash). Apply under abdomen folds twice daily until rash healed   omega-3 acid ethyl esters 1 g capsule Commonly known as:  LOVAZA TAKE ONE CAPSULE BY MOUTH EVERY DAY   oxyCODONE 5 MG immediate release tablet Commonly known as:  Oxy IR/ROXICODONE Take one tablet every 6 hours as needed for severe pain, Take 2 tablets at bedtime.   polyvinyl alcohol 1.4 % ophthalmic solution Commonly known as:  LIQUIFILM TEARS Place 1 drop into both eyes as needed (for dry eyes).   potassium chloride 10 MEQ CR capsule Commonly known as:  MICRO-K   promethazine 12.5 MG tablet Commonly known as:  PHENERGAN TAKE 1 TABLET BY MOUTH AS NEEDED FOR SEVERE NAUSEA AS DIRECTED   SPIRIVA HANDIHALER 18 MCG inhalation capsule Generic drug:  tiotropium INHALE THE CONTENTS OF 1 CAPSULE VIA HANDIHALER BY MOUTH DAILY FOR BREATHING   triamterene-hydrochlorothiazide 37.5-25 MG tablet Commonly known as:  MAXZIDE-25 GIVE "Malvika" 1 TABLET BY MOUTH DAILY FOR BLOOD PRESSURE   venlafaxine XR 75 MG 24 hr capsule Commonly known as:  EFFEXOR-XR Take 1 capsule (75 mg total) by mouth daily with breakfast.   VICTOZA 18 MG/3ML Sopn Generic drug:  liraglutide ADMINISTER 1.8 MG  UNDER THE SKIN DAILY FOR BLOOD SUGAR   Vitamin D 2000 units tablet Take 2,000 Units by mouth daily.   XELJANZ 5 MG Tabs Generic drug:  Tofacitinib Citrate TAKE 1 TABLET BY MOUTH TWICE DAILY       Review of Systems:  Review of Systems  Constitutional: Positive for malaise/fatigue. Negative for chills and fever.  HENT: Negative for congestion.   Eyes: Negative for blurred vision.  Respiratory: Negative for cough, shortness of breath and wheezing.   Cardiovascular: Positive for palpitations and leg swelling. Negative for chest pain, orthopnea, claudication and PND.  Gastrointestinal: Negative for abdominal pain, blood in stool and melena.  Genitourinary: Negative for dysuria.  Musculoskeletal: Positive for back pain, joint pain and myalgias. Negative for falls.  Skin: Negative for itching and rash.  Neurological: Positive for tingling and sensory change. Negative for dizziness.  Endo/Heme/Allergies: Bruises/bleeds easily.  Psychiatric/Behavioral: Positive for depression. Negative for memory loss. The patient is nervous/anxious and has insomnia.     Health Maintenance  Topic Date Due  . OPHTHALMOLOGY EXAM  08/21/2017 (Originally 12/20/1950)  . ZOSTAVAX  08/21/2017 (Originally 12/19/2000)  . INFLUENZA VACCINE  08/22/2019 (Originally 03/22/2016)  . FOOT EXAM  09/27/2016  . HEMOGLOBIN A1C  01/04/2017  . PNA vac Low Risk Adult (2 of 2 - PPSV23) 07/07/2017  . COLONOSCOPY  02/10/2025  . DEXA SCAN  09/20/2025  . TETANUS/TDAP  05/28/2026    Physical Exam: Vitals:   08/29/16 1319  BP: (!) 158/80  Pulse: 94  Temp: 98 F (36.7 C)  TempSrc: Oral  SpO2: 97%  Weight: 280 lb (127 kg)   Body mass index is 45.19 kg/m. Physical Exam  Constitutional: She is oriented to person, place, and time. She appears well-developed and well-nourished. No distress.  Neck: No JVD present.  Cardiovascular: Regular rhythm, normal heart sounds and intact distal pulses.   Slight tachy (not true)    Pulmonary/Chest: Effort normal and breath sounds normal. She has no rales.  Abdominal: Soft. Bowel sounds are  normal.  Musculoskeletal: Normal range of motion. She exhibits edema and tenderness.  Neurological: She is alert and oriented to person, place, and time.  Skin: Skin is warm and dry.  Left Great toe with callous and ulcer beneath on dorsal surface and second callous beneath metatarsal, slight erythema of the foot with swelling of both, left greater than right    Labs reviewed: Basic Metabolic Panel:  Recent Labs  09/25/15 0858  04/01/16 0900  06/16/16 0834 07/06/16 1456 07/07/16 0830  NA  --   < > 142  < > 138 138 139  K  --   < > 3.9  < > 4.3 4.3 4.4  CL  --   < > 104  < > 100 101 101  CO2  --   < > 28  < > 25 26 28   GLUCOSE  --   < > 63*  < > 207* 171* 200*  BUN  --   < > 22  < > 15 16 20   CREATININE  --   < > 0.82  < > 0.85 0.90 0.88  CALCIUM  --   < > 9.4  < > 9.2 9.6 9.3  TSH 0.558  --  0.23*  --   --   --   --   < > = values in this interval not displayed. Liver Function Tests:  Recent Labs  06/16/16 0834 07/06/16 1456 07/07/16 0830  AST 50* 71* 54*  ALT 37* 44* 38*  ALKPHOS 105 104 99  BILITOT 0.3 0.4 0.5  PROT 7.1 7.0 6.7  ALBUMIN 4.0 4.2 3.9   No results for input(s): LIPASE, AMYLASE in the last 8760 hours. No results for input(s): AMMONIA in the last 8760 hours. CBC:  Recent Labs  06/16/16 0834 07/06/16 1456 07/18/16 1107  WBC 17.3* 17.4* 21.1*  NEUTROABS 12,802* 14,094* 18,146*  HGB 14.5 14.6 14.3  HCT 44.6 44.6 44.8  MCV 85.0 83.1 85.0  PLT 358 361 364   Lipid Panel:  Recent Labs  04/01/16 0900  CHOL 143  HDL 46  LDLCALC 80  TRIG 85  CHOLHDL 3.1   Lab Results  Component Value Date   HGBA1C 7.5 (H) 07/07/2016   Assessment/Plan 1. Type 2 diabetes mellitus with diabetic polyneuropathy, with long-term current use of insulin (HCC) Cont current regimen as managed by Tivis Ringer  2. Diabetic ulcer of left great toe  [facility]) Following with Katherine Delgado Swelling of left greater than right foot, but does not appear to have any new evidence of infection this time  3. Chronic venous insufficiency -educated on this condition and then she informed me that Katherine Delgado had already told her to get compression hose but she had not done it yet--advised of importance of these and elevating feet at rest  4. Chronic fatigue fibromyalgia syndrome -with depression -ongoing, challenging to manage along with her other autoimmune diseases -also follows with Dr. Kathee Delton  5. Osteoarthritis of lumbar spine, unspecified spinal osteoarthritis complication status -ongoing back pain, cont her oxycodone when pain severe and hydrocodone when less severe and more active  6. Weight gain - suspect some degree of CHF also, f/u with cardiology - Basic metabolic panel - Brain Natriuretic Peptide - furosemide (LASIX) 20 MG tablet; Take 2 tablets (40 mg total) by mouth 2 (two) times daily.  Dispense: 60 tablet; Refill: 5 - Ambulatory referral to Cardiology  7. Acute congestive heart failure, unspecified congestive heart failure type ([place]) - Basic metabolic panel -  Brain Natriuretic Peptide - furosemide (LASIX) 20 MG tablet; Take 2 tablets (40 mg total) by mouth 2 (two) times daily.  Dispense: 60 tablet; Refill: 5 - Ambulatory referral to Cardiology  Labs/tests ordered:   Orders Placed This Encounter  Procedures  . Basic metabolic panel  . Brain Natriuretic Peptide  . Ambulatory referral to Cardiology    Referral Priority:   Routine    Referral Type:   Consultation    Referral Reason:   Specialty Services Required    Requested Specialty:   Cardiology    Number of Visits Requested:   1  . DNR (Do Not Resuscitate)    DNR reviewed.  Pt had MOST completed with me 01/16/14 which showed DNR, limited additional interventions, determine abx, determine IVF for trial period, no feeding tube.  Confirmed same today.    Order Specific Question:    In the event of cardiac or respiratory ARREST    Answer:   Do not call a "code blue"    Order Specific Question:   In the event of cardiac or respiratory ARREST    Answer:   Do not perform Intubation, CPR, defibrillation or ACLS    Order Specific Question:   In the event of cardiac or respiratory ARREST    Answer:   Use medication by any route, position, wound care, and other measures to relive pain and suffering. May use oxygen, suction and manual treatment of airway obstruction as needed for comfort.    Next appt:  10/06/2016  Katherine Delgado L. Sahar Ryback, D.O. [facility] [address], [place] [address] Cell Phone (Mon-Fri 8am-5pm):  [phone] On Call:  [phone] & follow prompts after 5pm & weekends Office Phone:  [phone] Office Fax:  [phone]

## 2016-08-30 LAB — BRAIN NATRIURETIC PEPTIDE: Brain Natriuretic Peptide: 58.2 pg/mL (ref <100)

## 2016-09-06 ENCOUNTER — Encounter: Payer: Self-pay | Admitting: Cardiology

## 2016-09-06 ENCOUNTER — Ambulatory Visit (INDEPENDENT_AMBULATORY_CARE_PROVIDER_SITE_OTHER): Payer: Medicare Other | Admitting: Cardiology

## 2016-09-06 VITALS — BP 184/72 | HR 105 | Ht 66.0 in | Wt 275.8 lb

## 2016-09-06 DIAGNOSIS — Z79899 Other long term (current) drug therapy: Secondary | ICD-10-CM | POA: Diagnosis not present

## 2016-09-06 DIAGNOSIS — M25551 Pain in right hip: Secondary | ICD-10-CM | POA: Diagnosis not present

## 2016-09-06 DIAGNOSIS — E11621 Type 2 diabetes mellitus with foot ulcer: Secondary | ICD-10-CM | POA: Diagnosis not present

## 2016-09-06 DIAGNOSIS — R0602 Shortness of breath: Secondary | ICD-10-CM

## 2016-09-06 DIAGNOSIS — R600 Localized edema: Secondary | ICD-10-CM

## 2016-09-06 DIAGNOSIS — M25552 Pain in left hip: Secondary | ICD-10-CM

## 2016-09-06 DIAGNOSIS — I10 Essential (primary) hypertension: Secondary | ICD-10-CM | POA: Diagnosis not present

## 2016-09-06 DIAGNOSIS — I739 Peripheral vascular disease, unspecified: Secondary | ICD-10-CM

## 2016-09-06 DIAGNOSIS — L97529 Non-pressure chronic ulcer of other part of left foot with unspecified severity: Secondary | ICD-10-CM

## 2016-09-06 MED ORDER — LISINOPRIL 20 MG PO TABS: 20.0000 mg | ORAL_TABLET | Freq: Every day | ORAL | 11 refills | Status: DC

## 2016-09-06 MED ORDER — AMLODIPINE BESYLATE 5 MG PO TABS: 5.0000 mg | ORAL_TABLET | Freq: Every day | ORAL | 11 refills | Status: DC

## 2016-09-06 NOTE — Progress Notes (Signed)
Cardiology Office Note  NEW PATIENT VISIT   Date:  09/06/2016   ID:  Katherine Delgado, DOB 1941/04/30, MRN [id number]  PCP:  Hollace Kinnier, DO  Cardiologist:  Estanislado Spire Dr. Stanford Breed in 2011   Chief Complaint  Patient presents with  . Chest Pain    not now but sometimes feels like her heart is skipping a beat   . Shortness of Breath    some   . Edema    both ankles and feet       History of Present Illness: Katherine Delgado is a 76 y.o. female who presents for DOE, with chest heaviness, and lower ext edema.  Her BNP was normal.    She has hx of DM insulin dependent. HTN, HLD and hx of 2 negative nuc studies in the past.   Now with increased edema, palpitations.  She also has a toe ulcer followed by Dr. Sharol Given.     She has RA, fibromyalgia and prob. Lupus.  She has been diabetic for > 20 years.  Now on insulin, last lipids with LDL 80, HDL 46 and T chol 143.  TG 85.    Her SOB is with exertion, just walking around room.  She does have chest heaviness with this.  Improves with rest.  She does not sleep well so difficult to tell if the SOB is keeping her awake.  She also complains of her hair falling out.  She is on BB but also with hypothyroid followed by PCP.    Additionally the ulcer on her Lt great toes, heals then returns.  Today her rt foot is with ecchymosis and cool compared to other foot.  She does complain of claudication with ambulation and Rt hip pain but the hip sounds more arthritic.    Past Medical History:  Diagnosis Date  . Abnormal weight gain   . Abnormality of gait 04/19/2016  . Acute infective polyneuritis ([place]) 2002  . Acute maxillary sinusitis   . Acute sinusitis, unspecified   . Allergic rhinitis due to pollen   . Arthritis   . Back injury   . Candidiasis of skin and nails   . Candidiasis of vulva and vagina   . Chronic pain syndrome   . COPD (chronic obstructive pulmonary disease) ([place])   . Cough   . Degenerative arthritis   . Depressive disorder, not  elsewhere classified   . Diabetes mellitus without complication ([place])   . Diaphragmatic hernia without mention of obstruction or gangrene   . Dyslipidemia   . Edema   . Fibromyalgia   . GERD (gastroesophageal reflux disease)   . Guillain-Barre syndrome ([place])   . History of benign thymus tumor   . Hypertension   . Insomnia, unspecified   . Lumbago   . Miscarriage 1962  . Mixed hyperlipidemia   . Morbid obesity ([place])   . Obstructive chronic bronchitis with acute bronchitis ([place])   . Osteopenia, senile   . Osteoporosis   . Other malaise and fatigue   . Other specified disease of white blood cells   . Pain in joint, multiple sites   . Polyneuropathy in diabetes(357.2)   . RA (rheumatoid arthritis) ([place])   . Reflux esophagitis   . Rheumatoid arthritis(714.0)   . Shortness of breath   . Spinal stenosis, lumbar region, without neurogenic claudication   . Spondylosis, lumbosacral   . Spontaneous ecchymoses   . Stiffness of joints, not elsewhere classified, multiple sites   .  Tear film insufficiency, unspecified   . Thyroid disease   . Thyroid disorder   . Type II or unspecified type diabetes mellitus with peripheral circulatory disorders, uncontrolled(250.72)   . Unspecified chronic bronchitis   . Unspecified essential hypertension   . Unspecified hypothyroidism   . Unspecified pruritic disorder   . Unspecified urinary incontinence   . Urinary tract infection, site not specified     Past Surgical History:  Procedure Laterality Date  . ABDOMINAL HYSTERECTOMY  1974  . abdominal tumor  2002  . APPENDECTOMY    . [place]  . BRONCHOSCOPY  2001  . CHOLECYSTECTOMY  1984  . KNEE SURGERY Bilateral 08/27/2010 (L) and 01/11/2011 (R)  . [place]  . OVARIAN CYST SURGERY  1968  . thymus tumor    . 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 TONSILLECTOMY       Current Outpatient Prescriptions  Medication Sig Dispense Refill  . amLODipine (NORVASC) 10 MG tablet TAKE 1 TABLET(10  MG) BY MOUTH DAILY 30 tablet 0  . aspirin EC 81 MG tablet Take 81 mg by mouth daily as needed.     . Cholecalciferol (VITAMIN D) 2000 units tablet Take 2,000 Units by mouth daily.    . furosemide (LASIX) 20 MG tablet Take 2 tablets (40 mg total) by mouth 2 (two) times daily. 60 tablet 5  . gabapentin (NEURONTIN) 600 MG tablet TAKE 1 TABLET BY MOUTH THREE TIMES DAILY 90 tablet 6  . glucose blood (ACCU-CHEK SMARTVIEW) test strip Check blood sugar 3-4 times daily    . HYDROcodone-acetaminophen (NORCO) 10-325 MG tablet Take one tablet every three hours as needed for pain 240 tablet 0  . Insulin Glargine (TOUJEO SOLOSTAR) 300 UNIT/ML SOPN Inject 100 Units into the skin at bedtime. 1.5 mL 6  . Insulin Lispro (HUMALOG KWIKPEN) 200 UNIT/ML SOPN Inject 20 Units into the skin 3 (three) times daily before meals. 12 mL 5  . Insulin Syringe-Needle U-100 31G X 5/16" 1 ML MISC Use once daily with administration of Insulin DX E11.59 100 each 11  . levothyroxine (SYNTHROID, LEVOTHROID) 137 MCG tablet TAKE 1 TABLET BY MOUTH DAILY BEFORE BREAKFAST ON AN EMPTY STOMACH( LABS OVERDUE) 90 tablet 1  . lisinopril (PRINIVIL,ZESTRIL) 10 MG tablet TAKE 1 TABLET(10 MG) BY MOUTH TWICE DAILY 180 tablet 0  . loperamide (IMODIUM A-D) 2 MG tablet Take 2 mg by mouth 4 (four) times daily as needed for diarrhea or loose stools.    . Melatonin 10 MG CAPS Take 1 capsule by mouth at bedtime    . metoprolol (LOPRESSOR) 50 MG tablet Take 1 tablet (50 mg total) by mouth 2 (two) times daily. 180 tablet 3  . NYSTATIN powder APPLY 1 GRAM TOPICALLY TO THE AFFECTED AREA DAILY AS NEEDED FOR ITCHING 60 g 0  . nystatin-triamcinolone (MYCOLOG II) cream Apply 1 application topically 4 (four) times daily as needed (for rash). Apply under abdomen folds twice daily until rash healed 30 g 5  . omega-3 acid ethyl esters (LOVAZA) 1 g capsule TAKE ONE CAPSULE BY MOUTH EVERY DAY 30 capsule 0  . oxyCODONE (OXY IR/ROXICODONE) 5 MG immediate release tablet Take  one tablet every 6 hours as needed for severe pain, Take 2 tablets at bedtime. 180 tablet 0  . polyvinyl alcohol (LIQUIFILM TEARS) 1.4 % ophthalmic solution Place 1 drop into both eyes as needed (for dry eyes).     . potassium chloride (MICRO-K) 10 MEQ CR capsule Take 2 capsules by  mouth 2 (two) times daily. While on lasix    . promethazine (PHENERGAN) 12.5 MG tablet TAKE 1 TABLET BY MOUTH AS NEEDED FOR SEVERE NAUSEA AS DIRECTED 30 tablet 0  . SPIRIVA HANDIHALER 18 MCG inhalation capsule INHALE THE CONTENTS OF 1 CAPSULE VIA HANDIHALER BY MOUTH DAILY FOR BREATHING 30 capsule 3  . triamterene-hydrochlorothiazide (MAXZIDE-25) 37.5-25 MG tablet GIVE "[place]" 1 TABLET BY MOUTH DAILY FOR BLOOD PRESSURE 30 tablet 0  . venlafaxine XR (EFFEXOR-XR) 75 MG 24 hr capsule Take 1 capsule (75 mg total) by mouth daily with breakfast. 90 capsule 3  . VICTOZA 18 MG/3ML SOPN ADMINISTER 1.8 MG UNDER THE SKIN DAILY FOR BLOOD SUGAR 9 mL 3  . XELJANZ 5 MG TABS TAKE 1 TABLET BY MOUTH TWICE DAILY 60 tablet 2   No current facility-administered medications for this visit.     Allergies:   Penicillins and Sulfamethoxazole-trimethoprim    Social History:  The patient  reports that she quit smoking about 36 years ago. Her smoking use included Cigarettes. She has never used smokeless tobacco. She reports that she does not drink alcohol or use drugs.   Family History:  The patient's family history includes Alzheimer's disease in her mother; Arrhythmia in her sister; Breast cancer in her sister; Cancer in her brother; Colon polyps in her brother; Heart disease in her father and mother; Liver disease in her father.    ROS:  General:no colds or fevers, + weight loss  Skin:no rashes Lt great toe ulcer HEENT:no blurred vision, no congestion CV:see HPI PUL:see HPI GI:no diarrhea constipation or melena, no indigestion GU:no hematuria, no dysuria MS:+ joint pain, + claudication, hx RA and fibromyalgia  Neuro:no syncope, no  lightheadedness Endo:+ diabetes for 20 years, now on insulin for several years., + thyroid disease all followed by PCP  Wt Readings from Last 3 Encounters:  09/06/16 275 lb 12.8 oz (125.1 kg)  08/29/16 280 lb (127 kg)  08/23/16 269 lb (122 kg)     PHYSICAL EXAM: VS:  BP (!) 184/72   Pulse (!) 105   Ht 5\' 6"  (1.676 m)   Wt 275 lb 12.8 oz (125.1 kg)   BMI 44.52 kg/m  , BMI Body mass index is 44.52 kg/m. General:Pleasant affect, NAD Skin:Warm and dry, brisk capillary refill HEENT:normocephalic, sclera clear, mucus membranes moist Neck:supple, no JVD, no bruits  Heart:S1S2 RRR without murmur, gallup, rub or click Lungs:clear without rales, rhonchi, or wheezes [id number], soft, non tender, + BS, do not palpate liver spleen or masses Ext:1-2+ lower ext edema, ?+ pedal pulses, 2+ radial pulses, rt foot with ecchymosis and cooler than Lt foot, Lt great toe with ulcer posterior.  Heals then returns.   Neuro:alert and oriented X 3, MAE, follows commands, + facial symmetry    EKG:  EKG is ordered today. The ekg ordered today demonstrates ST at 105 but no changes from previous EKG.    Recent Labs: 04/01/2016: TSH 0.23 07/07/2016: ALT 38; BUN 20; Creat 0.88; Potassium 4.4; Sodium 139 07/18/2016: Hemoglobin 14.3; Platelets 364 08/29/2016: Brain Natriuretic Peptide 58.2    Lipid Panel    Component Value Date/Time   CHOL 143 04/01/2016 0900   CHOL 148 03/16/2015 0832   TRIG 85 04/01/2016 0900   HDL 46 04/01/2016 0900   HDL 38 (L) 03/16/2015 0832   CHOLHDL 3.1 04/01/2016 0900   VLDL 17 04/01/2016 0900   LDLCALC 80 04/01/2016 0900   LDLCALC 89 03/16/2015 0832       Other studies  Reviewed: Additional studies/ records that were reviewed today include: . Nuc study 2011 Exercise Capacity: Lexiscan with no exercise. BP Response: Normal blood pressure response. Clinical Symptoms: No chest pain ECG Impression: No significant ST segment change suggestive of ischemia. Overall  Impression: Normal lexiscan nuclear study with no ischemia or infarction.  Echo 2011 was normal  ASSESSMENT AND PLAN:  1.  DOE and lower ext edema with normal BNP  Most likely diastolic HF. Though for lower ext edema she is on amlodipine which can cause lower ext edema.  Will decrease the amlodipine to 5 mg.  Reviewed with Dr  Lovena Le. Will check TTE.   2.  Chest heaviness will check lexiscan myoview - she has IDDM concerning for CAD  3.  HTN uncontrolled today will increase lisinopril to 30 mg and check BMP and Mg+.   4.  Cool Rt foot. Ulcer on Lt gt toe will check lower ext arterial dopplers some of her leg pain seems to be claudication.  Will have her seen in a week for BP check and then for results with Dr. Stanford Breed.         Current medicines are reviewed with the patient today.  The patient Has no concerns regarding medicines.  The following changes have been made:  See above Labs/ tests ordered today include:see above  Disposition:   FU:  see above  Signed, Cecilie Kicks, NP  09/06/2016 2:29 PM    [facility] [place], [place], [place], [place] Phone: [phone]; Fax: [phone]

## 2016-09-06 NOTE — Patient Instructions (Addendum)
Your physician has recommended you make the following change in your medication:  DECREASE AMLODIPINE TO  5 MG  AND INCREASE  LISINOPRIL TO 20 MG  EVERY DAY .  Your physician recommends that you return for lab work in: [place]  [place] has requested that you have a lower extremity arterial exercise duplex. During this test, exercise and ultrasound are used to evaluate arterial blood flow in the legs. Allow one hour for this exam. There are no restrictions or special instructions. Your physician has requested that you have a lexiscan myoview. For further information please visit [email or web address]. Please follow instruction sheet, as given.  Your physician has requested that you have an echocardiogram. Echocardiography is a painless test that uses sound waves to create images of your heart. It provides your doctor with information about the size and shape of your heart and how well your heart's chambers and valves are working. This procedure takes approximately one hour. There are no restrictions for this procedure.    Your physician recommends that you schedule a follow-up appointment in: AFTER TESTS ARE  DONE  WITH DR  Stanford Breed AND   1  WEEK  NURSE VISIT  B/P CHECK

## 2016-09-07 LAB — BASIC METABOLIC PANEL
BUN/Creatinine Ratio: 17 (ref 12–28)
BUN: 20 mg/dL (ref 8–27)
CALCIUM: 9.8 mg/dL (ref 8.7–10.3)
CHLORIDE: 96 mmol/L (ref 96–106)
CO2: 20 mmol/L (ref 18–29)
Creatinine, Ser: 1.21 mg/dL — ABNORMAL HIGH (ref 0.57–1.00)
GFR calc non Af Amer: 44 mL/min/{1.73_m2} — ABNORMAL LOW (ref >59)
GFR, EST AFRICAN AMERICAN: 51 mL/min/{1.73_m2} — AB (ref >59)
Glucose: 228 mg/dL — ABNORMAL HIGH (ref 65–99)
POTASSIUM: 4.3 mmol/L (ref 3.5–5.2)
Sodium: 141 mmol/L (ref 134–144)

## 2016-09-07 LAB — MAGNESIUM: MAGNESIUM: 2 mg/dL (ref 1.6–2.3)

## 2016-09-09 ENCOUNTER — Other Ambulatory Visit: Payer: Self-pay | Admitting: Internal Medicine

## 2016-09-12 ENCOUNTER — Other Ambulatory Visit: Payer: Self-pay | Admitting: Internal Medicine

## 2016-09-13 NOTE — Progress Notes (Signed)
Patient ID: Katherine Delgado                 DOB: [birth year]                      MRN: [id number]     HPI: Katherine Delgado is a 76 y.o. female patient of Dr. Stanford Breed (last seen in 2011), referred by Cecilie Kicks, PA with PMH below who presents today for hypertension evaluation. She recently presented for DOE and LLE. He BNP was normal. At her most recent visit her amlodipine was decreased to 5mg  daily (suspected that may be contributing to LLE) and her lisinopril was increased to 30mg  daily. She will have ECHO and lower extremity US in the next few weeks to evaluate cardiac function and for claudication.   She presents today and states that she continues to gain weight. She is up about 9 pounds since her visit last week and she states that she feels bloated in her midsection. She states she gets short of breath easily, but not SOB at rest at this time. She states her pressure has been following the trend of her weight and continues to increase.   She reports she has not actually been taking amlodipine as she did not have this in her home at the time of her last visit. She also reports that she has continued on lisinopril 20mg  rather than 30 mg.   Her recent BMET showed a bump in Scr from 0.8 to 1.2. Of note her BNP was normal.    Cardiac Hx: DM, HTN, HLD, 2 negative nuc studies, RA, fibromyalgia and probably Lupus  Current HTN meds:  Furosemide 40mg  BID maxide 37.5/25mg  daily Metoprolol tartrate 50mg  BID Lisinopril 20mg  daily  Previously tried: amlodipine  BP goal: <130/80  Diet: She has tried to limit sodium. She has been washing canned vegetables before consumption and increasing her fresh vegetable intake. She has not been limiting fluid as she feels very thirsty. She drinks 2 cups of coffee in the morning, 2 glasses of water per day, 3 glasses of green tea and 1-2 glasses of skim milk. She has been trying to watch sugar intake as well.   Wt Readings from Last 3 Encounters:  09/14/16 283  lb 8 oz (128.6 kg)  09/06/16 275 lb 12.8 oz (125.1 kg)  08/29/16 280 lb (127 kg)   BP Readings from Last 3 Encounters:  09/14/16 (!) 164/70  09/06/16 (!) 184/72  08/29/16 (!) 158/80   Pulse Readings from Last 3 Encounters:  09/14/16 73  09/06/16 (!) 105  08/29/16 94    Renal function: Estimated Creatinine Clearance: 55.2 mL/min (by C-G formula based on SCr of 1.21 mg/dL (H)).  Past Medical History:  Diagnosis Date  . Abnormal weight gain   . Abnormality of gait 04/19/2016  . Acute infective polyneuritis ([place]) 2002  . Acute maxillary sinusitis   . Acute sinusitis, unspecified   . Allergic rhinitis due to pollen   . Arthritis   . Back injury   . Candidiasis of skin and nails   . Candidiasis of vulva and vagina   . Chronic pain syndrome   . COPD (chronic obstructive pulmonary disease) ([place])   . Cough   . Degenerative arthritis   . Depressive disorder, not elsewhere classified   . Diabetes mellitus without complication ([place])   . Diaphragmatic hernia without mention of obstruction or gangrene   . Dyslipidemia   . Edema   . Fibromyalgia   .  GERD (gastroesophageal reflux disease)   . Guillain-Barre syndrome ([place])   . History of benign thymus tumor   . Hypertension   . Insomnia, unspecified   . Lumbago   . Miscarriage 1962  . Mixed hyperlipidemia   . Morbid obesity ([place])   . Obstructive chronic bronchitis with acute bronchitis ([place])   . Osteopenia, senile   . Osteoporosis   . Other malaise and fatigue   . Other specified disease of white blood cells   . Pain in joint, multiple sites   . Polyneuropathy in diabetes(357.2)   . RA (rheumatoid arthritis) ([place])   . Reflux esophagitis   . Rheumatoid arthritis(714.0)   . Shortness of breath   . Spinal stenosis, lumbar region, without neurogenic claudication   . Spondylosis, lumbosacral   . Spontaneous ecchymoses   . Stiffness of joints, not elsewhere classified, multiple sites   . Tear film insufficiency, unspecified     . Thyroid disease   . Thyroid disorder   . Type II or unspecified type diabetes mellitus with peripheral circulatory disorders, uncontrolled(250.72)   . Unspecified chronic bronchitis   . Unspecified essential hypertension   . Unspecified hypothyroidism   . Unspecified pruritic disorder   . Unspecified urinary incontinence   . Urinary tract infection, site not specified     Current Outpatient Prescriptions on File Prior to Visit  Medication Sig Dispense Refill  . aspirin EC 81 MG tablet Take 81 mg by mouth daily as needed.     . Cholecalciferol (VITAMIN D) 2000 units tablet Take 2,000 Units by mouth daily.    Marland Kitchen gabapentin (NEURONTIN) 600 MG tablet TAKE 1 TABLET BY MOUTH THREE TIMES DAILY 90 tablet 6  . glucose blood (ACCU-CHEK SMARTVIEW) test strip Check blood sugar 3-4 times daily    . HYDROcodone-acetaminophen (NORCO) 10-325 MG tablet Take one tablet every three hours as needed for pain 240 tablet 0  . Insulin Glargine (TOUJEO SOLOSTAR) 300 UNIT/ML SOPN Inject 100 Units into the skin at bedtime. 1.5 mL 6  . Insulin Lispro (HUMALOG KWIKPEN) 200 UNIT/ML SOPN Inject 20 Units into the skin 3 (three) times daily before meals. 12 mL 5  . Insulin Syringe-Needle U-100 31G X 5/16" 1 ML MISC Use once daily with administration of Insulin DX E11.59 100 each 11  . levothyroxine (SYNTHROID, LEVOTHROID) 137 MCG tablet TAKE 1 TABLET BY MOUTH DAILY BEFORE BREAKFAST ON AN EMPTY STOMACH( LABS OVERDUE) 90 tablet 1  . lisinopril (PRINIVIL,ZESTRIL) 20 MG tablet Take 1 tablet (20 mg total) by mouth daily. 30 tablet 11  . loperamide (IMODIUM A-D) 2 MG tablet Take 2 mg by mouth 4 (four) times daily as needed for diarrhea or loose stools.    . Melatonin 10 MG CAPS Take 1 capsule by mouth at bedtime    . metoprolol (LOPRESSOR) 50 MG tablet Take 1 tablet (50 mg total) by mouth 2 (two) times daily. 180 tablet 3  . NYSTATIN powder APPLY 1 GRAM TOPICALLY TO THE AFFECTED AREA DAILY AS NEEDED FOR ITCHING 60 g 0  .  nystatin-triamcinolone (MYCOLOG II) cream Apply 1 application topically 4 (four) times daily as needed (for rash). Apply under abdomen folds twice daily until rash healed 30 g 5  . omega-3 acid ethyl esters (LOVAZA) 1 g capsule TAKE ONE CAPSULE BY MOUTH EVERY DAY 30 capsule 0  . oxyCODONE (OXY IR/ROXICODONE) 5 MG immediate release tablet Take one tablet every 6 hours as needed for severe pain, Take 2 tablets at bedtime. 180 tablet  0  . polyvinyl alcohol (LIQUIFILM TEARS) 1.4 % ophthalmic solution Place 1 drop into both eyes as needed (for dry eyes).     . potassium chloride (MICRO-K) 10 MEQ CR capsule Take 2 capsules by mouth 2 (two) times daily. While on lasix    . promethazine (PHENERGAN) 12.5 MG tablet TAKE 1 TABLET BY MOUTH AS NEEDED FOR SEVERE NAUSEA AS DIRECTED 30 tablet 0  . SPIRIVA HANDIHALER 18 MCG inhalation capsule INHALE THE CONTENTS OF 1 CAPSULE VIA HANDIHALER BY MOUTH DAILY FOR BREATHING 30 capsule 3  . triamterene-hydrochlorothiazide (MAXZIDE-25) 37.5-25 MG tablet GIVE "[place]" 1 TABLET BY MOUTH DAILY FOR BLOOD PRESSURE 30 tablet 0  . venlafaxine XR (EFFEXOR-XR) 75 MG 24 hr capsule Take 1 capsule (75 mg total) by mouth daily with breakfast. 90 capsule 3  . VICTOZA 18 MG/3ML SOPN ADMINISTER 1.8 MG UNDER THE SKIN DAILY FOR BLOOD SUGAR 9 mL 3  . XELJANZ 5 MG TABS TAKE 1 TABLET BY MOUTH TWICE DAILY 60 tablet 2   No current facility-administered medications on file prior to visit.     Allergies  Allergen Reactions  . Penicillins Hives, Itching, Swelling and Rash  . Sulfamethoxazole-Trimethoprim Itching    Blood pressure (!) 164/70, pulse 73, weight 283 lb 8 oz (128.6 kg), SpO2 96 %.   Assessment/Plan: Hypertension: BP not at goal. Will defer additional medication changes today due to volume overload. Would plan to increase lisinopril, but will wait due to current stress on kidneys with higher dose of lasix and recent bump in Scr. Discussed case with Dr. Radford Pax, DOD and she recommended  below.    Suspected volume overload: Increase lasix to 80mg  each morning and 40mg  each evening. Also recommended sodium and fluid restriction. BMET and visit with extender in 1 week to check status. Follow up with hypertension clinic as need for additional titration of medications after visit with Dr. Stanford Breed.    Thank you, Lelan Pons. Patterson [place], [place]

## 2016-09-14 ENCOUNTER — Ambulatory Visit (INDEPENDENT_AMBULATORY_CARE_PROVIDER_SITE_OTHER): Payer: Medicare Other | Admitting: Pharmacist

## 2016-09-14 VITALS — BP 164/70 | HR 73 | Wt 283.5 lb

## 2016-09-14 DIAGNOSIS — I10 Essential (primary) hypertension: Secondary | ICD-10-CM | POA: Diagnosis not present

## 2016-09-14 DIAGNOSIS — I509 Heart failure, unspecified: Secondary | ICD-10-CM | POA: Diagnosis not present

## 2016-09-14 DIAGNOSIS — R635 Abnormal weight gain: Secondary | ICD-10-CM | POA: Diagnosis not present

## 2016-09-14 MED ORDER — FUROSEMIDE 20 MG PO TABS: ORAL_TABLET | ORAL | 5 refills | Status: DC

## 2016-09-14 NOTE — Patient Instructions (Addendum)
Return for a follow up appointment in 1 week with PA  Check your blood pressure at home daily (if able) and keep record of the readings.  Take your BP meds as follows: INCREASE Lasix (furosemide) to 80mg  each morning and 40mg  each evening    Bring all of your meds, your BP cuff and your record of home blood pressures to your next appointment.  Exercise as you're able, try to walk approximately 30 minutes per day.  Keep salt intake to a minimum, especially watch canned and prepared boxed foods.  Eat more fresh fruits and vegetables and fewer canned items.  Avoid eating in fast food restaurants.    HOW TO TAKE YOUR BLOOD PRESSURE: . Rest 5 minutes before taking your blood pressure. .  Don't smoke or drink caffeinated beverages for at least 30 minutes before. . Take your blood pressure before (not after) you eat. . Sit comfortably with your back supported and both feet on the floor (don't cross your legs). . Elevate your arm to heart level on a table or a desk. . Use the proper sized cuff. It should fit smoothly and snugly around your bare upper arm. There should be enough room to slip a fingertip under the cuff. The bottom edge of the cuff should be 1 inch above the crease of the elbow. . Ideally, take 3 measurements at one sitting and record the average.  DASH Eating Plan DASH stands for "Dietary Approaches to Stop Hypertension." The DASH eating plan is a healthy eating plan that has been shown to reduce high blood pressure (hypertension). Additional health benefits may include reducing the risk of type 2 diabetes mellitus, heart disease, and stroke. The DASH eating plan may also help with weight loss. What do I need to know about the DASH eating plan? For the DASH eating plan, you will follow these general guidelines:  Choose foods with less than 150 milligrams of sodium per serving (as listed on the food label).  Use salt-free seasonings or herbs instead of table salt or sea salt.  Check  with your health care provider or pharmacist before using salt substitutes.  Eat lower-sodium products. These are often labeled as "low-sodium" or "no salt added."  Eat fresh foods. Avoid eating a lot of canned foods.  Eat more vegetables, fruits, and low-fat dairy products.  Choose whole grains. Look for the word "whole" as the first word in the ingredient list.  Choose fish and skinless chicken or [place] more often than red meat. Limit fish, poultry, and meat to 6 oz (170 g) each day.  Limit sweets, desserts, sugars, and sugary drinks.  Choose heart-healthy fats.  Eat more home-cooked food and less restaurant, buffet, and fast food.  Limit fried foods.  Do not fry foods. Cook foods using methods such as baking, boiling, grilling, and broiling instead.  When eating at a restaurant, ask that your food be prepared with less salt, or no salt if possible. What foods can I eat? Seek help from a dietitian for individual calorie needs. Grains  Whole grain or whole wheat bread. Brown rice. Whole grain or whole wheat pasta. Quinoa, bulgur, and whole grain cereals. Low-sodium cereals. Corn or whole wheat flour tortillas. Whole grain cornbread. Whole grain crackers. Low-sodium crackers. Vegetables  Fresh or frozen vegetables (raw, steamed, roasted, or grilled). Low-sodium or reduced-sodium tomato and vegetable juices. Low-sodium or reduced-sodium tomato sauce and paste. Low-sodium or reduced-sodium canned vegetables. Fruits  All fresh, canned (in natural juice), or frozen fruits.  Meat and Other Protein Products  Ground beef (85% or leaner), grass-fed beef, or beef trimmed of fat. Skinless chicken or [place]. Ground chicken or [place]. Pork trimmed of fat. All fish and seafood. Eggs. Dried beans, peas, or lentils. Unsalted nuts and seeds. Unsalted canned beans. Dairy  Low-fat dairy products, such as skim or 1% milk, 2% or reduced-fat cheeses, low-fat ricotta or cottage cheese, or plain low-fat  yogurt. Low-sodium or reduced-sodium cheeses. Fats and Oils  Tub margarines without trans fats. Light or reduced-fat mayonnaise and salad dressings (reduced sodium). Avocado. Safflower, olive, or canola oils. Natural peanut or almond butter. Other  Unsalted popcorn and pretzels. The items listed above may not be a complete list of recommended foods or beverages. Contact your dietitian for more options.  What foods are not recommended? Grains  White bread. White pasta. White rice. Refined cornbread. Bagels and croissants. Crackers that contain trans fat. Vegetables  Creamed or fried vegetables. Vegetables in a cheese sauce. Regular canned vegetables. Regular canned tomato sauce and paste. Regular tomato and vegetable juices. Fruits  Canned fruit in light or heavy syrup. Fruit juice. Meat and Other Protein Products  Fatty cuts of meat. Ribs, chicken wings, bacon, sausage, bologna, salami, chitterlings, fatback, hot dogs, bratwurst, and packaged luncheon meats. Salted nuts and seeds. Canned beans with salt. Dairy  Whole or 2% milk, cream, half-and-half, and cream cheese. Whole-fat or sweetened yogurt. Full-fat cheeses or blue cheese. Nondairy creamers and whipped toppings. Processed cheese, cheese spreads, or cheese curds. Condiments  Onion and garlic salt, seasoned salt, table salt, and sea salt. Canned and packaged gravies. Worcestershire sauce. Tartar sauce. Barbecue sauce. Teriyaki sauce. Soy sauce, including reduced sodium. Steak sauce. Fish sauce. Oyster sauce. Cocktail sauce. Horseradish. Ketchup and mustard. Meat flavorings and tenderizers. Bouillon cubes. Hot sauce. Tabasco sauce. Marinades. Taco seasonings. Relishes. Fats and Oils  Butter, stick margarine, lard, shortening, ghee, and bacon fat. Coconut, palm kernel, or palm oils. Regular salad dressings. Other  Pickles and olives. Salted popcorn and pretzels. The items listed above may not be a complete list of foods and beverages to  avoid. Contact your dietitian for more information.  Where can I find more information? National Heart, Lung, and Blood Institute: travelstabloid.com This information is not intended to replace advice given to you by your health care provider. Make sure you discuss any questions you have with your health care provider. Document Released: 07/28/2011 Document Revised: 01/14/2016 Document Reviewed: 06/12/2013 Elsevier Interactive Patient Education  2017 [employer].

## 2016-09-15 ENCOUNTER — Encounter: Payer: Self-pay | Admitting: Pharmacist

## 2016-09-19 ENCOUNTER — Other Ambulatory Visit: Payer: Self-pay | Admitting: Cardiology

## 2016-09-19 DIAGNOSIS — M25551 Pain in right hip: Secondary | ICD-10-CM

## 2016-09-19 DIAGNOSIS — M25552 Pain in left hip: Principal | ICD-10-CM

## 2016-09-19 DIAGNOSIS — E11621 Type 2 diabetes mellitus with foot ulcer: Secondary | ICD-10-CM

## 2016-09-19 DIAGNOSIS — L97529 Non-pressure chronic ulcer of other part of left foot with unspecified severity: Secondary | ICD-10-CM

## 2016-09-22 ENCOUNTER — Ambulatory Visit (HOSPITAL_COMMUNITY)
Admission: RE | Admit: 2016-09-22 | Discharge: 2016-09-22 | Disposition: A | Discharge status: Home / Self Care | Payer: Medicare Other | Source: Ambulatory Visit | Attending: Cardiovascular Disease | Admitting: Cardiovascular Disease

## 2016-09-22 ENCOUNTER — Ambulatory Visit (INDEPENDENT_AMBULATORY_CARE_PROVIDER_SITE_OTHER): Payer: Medicare Other | Admitting: Student

## 2016-09-22 ENCOUNTER — Encounter: Payer: Self-pay | Admitting: Student

## 2016-09-22 ENCOUNTER — Telehealth (HOSPITAL_COMMUNITY): Payer: Self-pay | Admitting: *Deleted

## 2016-09-22 VITALS — BP 188/74 | HR 100 | Ht 66.0 in | Wt 282.6 lb

## 2016-09-22 DIAGNOSIS — I509 Heart failure, unspecified: Secondary | ICD-10-CM | POA: Diagnosis not present

## 2016-09-22 DIAGNOSIS — M25552 Pain in left hip: Secondary | ICD-10-CM | POA: Diagnosis not present

## 2016-09-22 DIAGNOSIS — E11621 Type 2 diabetes mellitus with foot ulcer: Secondary | ICD-10-CM | POA: Diagnosis not present

## 2016-09-22 DIAGNOSIS — Z79899 Other long term (current) drug therapy: Secondary | ICD-10-CM

## 2016-09-22 DIAGNOSIS — R0609 Other forms of dyspnea: Secondary | ICD-10-CM

## 2016-09-22 DIAGNOSIS — L03032 Cellulitis of left toe: Secondary | ICD-10-CM

## 2016-09-22 DIAGNOSIS — L97529 Non-pressure chronic ulcer of other part of left foot with unspecified severity: Secondary | ICD-10-CM | POA: Insufficient documentation

## 2016-09-22 DIAGNOSIS — I10 Essential (primary) hypertension: Secondary | ICD-10-CM

## 2016-09-22 DIAGNOSIS — M25551 Pain in right hip: Secondary | ICD-10-CM | POA: Diagnosis not present

## 2016-09-22 MED ORDER — AMLODIPINE BESYLATE 10 MG PO TABS: 10.0000 mg | ORAL_TABLET | Freq: Every day | ORAL | 3 refills | Status: DC

## 2016-09-22 MED ORDER — CEPHALEXIN 500 MG PO CAPS: 500.0000 mg | ORAL_CAPSULE | Freq: Two times a day (BID) | ORAL | 0 refills | Status: DC

## 2016-09-22 MED ORDER — LISINOPRIL 20 MG PO TABS: 40.0000 mg | ORAL_TABLET | Freq: Every day | ORAL | 11 refills | Status: DC

## 2016-09-22 MED ORDER — FUROSEMIDE 20 MG PO TABS: ORAL_TABLET | ORAL | 5 refills | Status: DC

## 2016-09-22 NOTE — Telephone Encounter (Signed)
Patient's son Ovid Curd, per [facility], given detailed instructions per Myocardial Perfusion Study Information Sheet for the test on 09/26/16. Patient notified to arrive 15 minutes early and that it is imperative to arrive on time for appointment to keep from having the test rescheduled.  If you need to cancel or reschedule your appointment, please call the office within 24 hours of your appointment. Failure to do so may result in a cancellation of your appointment, and a $50 no show fee. Patient verbalized understanding. Kirstie Peri

## 2016-09-22 NOTE — Patient Instructions (Addendum)
Medication Instructions:  START taking amlodipine 10 mg (1 tablet) one time daily.  START Keflex 500 mg (1 capsule) two times a day for 7 DAYS.  CONTINUE Lasix 60 mg (3 tablets) in the morning and 40 mg (2 tablets) in the evening.  CONTINUE lisinopril 40 mg (2 tablets) one time daily.    Labwork: Have lab work TODAY (BMET)  Testing/Procedures: NONE  Follow-Up: Keep follow up with Dr. Stanford Breed on 2/12 at 12:15 at [facility] office.  Any Other Special Instructions Will Be Listed Below (If Applicable).     If you need a refill on your cardiac medications before your next appointment, please call your pharmacy.

## 2016-09-22 NOTE — Progress Notes (Signed)
Cardiology Office Note    Date:  09/22/2016   ID:  Katherine Delgado, Katherine Delgado [birth year], MRN [id number]  PCP:  Hollace Kinnier, DO  Cardiologist: Dr. Stanford Breed  Chief Complaint  Patient presents with  . Follow-up  . Shortness of Breath    when exerting self.  . Edema    feet and legs.    History of Present Illness:    Katherine Delgado is a 76 y.o. female with past medical history of IDDM, HTN, and HLD who presents to the office today for evaluation of worsening dyspnea.   Was seen by Cecilie Kicks, NP on 09/06/2016 as a new patient referral for dyspnea on exertion, chest pain, and lower extremity edema. Also reported having an ulcer along her left great toe which would heal then return. Reported bilateral claudication. BP was 184/72 at the time of her visit. Amlodipine was decreased from 10 mg daily to 5 mg daily in the setting of her lower extremity edema and Lisinopril increased to 30 mg daily. She was scheduled for a transthoracic echocardiogram, Lexiscan Myoview, and lower extremity Dopplers.  Was seen by pharmacy on 1/24 for a blood pressure check. Weight was up 8 pounds since her prior visit and she reported worsening dyspnea. She reported having not taken Amlodipine as she did not have this at home and she had continued on Lisinopril 20 mg daily instead of increasing this to 30mg . With her volume overload, her Lasix was increased from 40mg  BID to 80mg  in AM and 40mg  in PM.   In talking with the patient today, she reports still having significant edema. Weight has gone down 1 pound since her recent BP check. She is still having significant dyspnea with exertion but denies any orthopnea or PND. She has only been taking Lasix 60 mg in AM and 40 mg in PM due to significant urination. She wishes to avoid increasing this dose if at all possible.   She is having significant pain along her left great toe. She has an ulcer along the bottom of the toe which has yellow drainage. Her toe is significantly  swollen and warm. Not overly tender to touch. Lower extremity dopplers were obtained earlier today with results pending.   BP initially elevated at 211/89, 188/74 on recheck. She has not taken Amlodipine due to running out of this prescription. Had tried a lower dose in the past with no improvement in her edema. It is listed as she is taking Lisinopril 20 mg daily but she reports taking 20 mg twice daily.  Reports good compliance with her Triamterene-HCTZ.   Past Medical History:  Diagnosis Date  . Abnormal weight gain   . Abnormality of gait 04/19/2016  . Acute infective polyneuritis ([place]) 2002  . Acute maxillary sinusitis   . Acute sinusitis, unspecified   . Allergic rhinitis due to pollen   . Arthritis   . Back injury   . Candidiasis of skin and nails   . Candidiasis of vulva and vagina   . Chronic pain syndrome   . COPD (chronic obstructive pulmonary disease) ([place])   . Cough   . Degenerative arthritis   . Depressive disorder, not elsewhere classified   . Diabetes mellitus without complication ([place])   . Diaphragmatic hernia without mention of obstruction or gangrene   . Dyslipidemia   . Edema   . Fibromyalgia   . GERD (gastroesophageal reflux disease)   . Guillain-Barre syndrome ([place])   . History of benign thymus tumor   .  Hypertension   . Insomnia, unspecified   . Lumbago   . Miscarriage 1962  . Mixed hyperlipidemia   . Morbid obesity ([place])   . Obstructive chronic bronchitis with acute bronchitis ([place])   . Osteopenia, senile   . Osteoporosis   . Other malaise and fatigue   . Other specified disease of white blood cells   . Pain in joint, multiple sites   . Polyneuropathy in diabetes(357.2)   . RA (rheumatoid arthritis) ([place])   . Reflux esophagitis   . Rheumatoid arthritis(714.0)   . Shortness of breath   . Spinal stenosis, lumbar region, without neurogenic claudication   . Spondylosis, lumbosacral   . Spontaneous ecchymoses   . Stiffness of joints, not elsewhere  classified, multiple sites   . Tear film insufficiency, unspecified   . Thyroid disease   . Thyroid disorder   . Type II or unspecified type diabetes mellitus with peripheral circulatory disorders, uncontrolled(250.72)   . Unspecified chronic bronchitis   . Unspecified essential hypertension   . Unspecified hypothyroidism   . Unspecified pruritic disorder   . Unspecified urinary incontinence   . Urinary tract infection, site not specified     Past Surgical History:  Procedure Laterality Date  . ABDOMINAL HYSTERECTOMY  1974  . abdominal tumor  2002  . APPENDECTOMY    . [place]  . BRONCHOSCOPY  2001  . CHOLECYSTECTOMY  1984  . KNEE SURGERY Bilateral 08/27/2010 (L) and 01/11/2011 (R)  . [place]  . OVARIAN CYST SURGERY  1968  . thymus tumor    . 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 TONSILLECTOMY      Current Medications: Outpatient Medications Prior to Visit  Medication Sig Dispense Refill  . aspirin EC 81 MG tablet Take 81 mg by mouth daily as needed.     . Cholecalciferol (VITAMIN D) 2000 units tablet Take 2,000 Units by mouth daily.    Marland Kitchen gabapentin (NEURONTIN) 600 MG tablet TAKE 1 TABLET BY MOUTH THREE TIMES DAILY 90 tablet 6  . glucose blood (ACCU-CHEK SMARTVIEW) test strip Check blood sugar 3-4 times daily    . HYDROcodone-acetaminophen (NORCO) 10-325 MG tablet Take one tablet every three hours as needed for pain 240 tablet 0  . Insulin Glargine (TOUJEO SOLOSTAR) 300 UNIT/ML SOPN Inject 100 Units into the skin at bedtime. 1.5 mL 6  . Insulin Lispro (HUMALOG KWIKPEN) 200 UNIT/ML SOPN Inject 20 Units into the skin 3 (three) times daily before meals. 12 mL 5  . Insulin Syringe-Needle U-100 31G X 5/16" 1 ML MISC Use once daily with administration of Insulin DX E11.59 100 each 11  . levothyroxine (SYNTHROID, LEVOTHROID) 137 MCG tablet TAKE 1 TABLET BY MOUTH DAILY BEFORE BREAKFAST ON AN EMPTY STOMACH( LABS OVERDUE) 90 tablet 1  . loperamide (IMODIUM A-D) 2 MG tablet Take 2  mg by mouth 4 (four) times daily as needed for diarrhea or loose stools.    . magnesium oxide (MAG-OX) 400 MG tablet Take 400 mg by mouth daily as needed.    . Melatonin 10 MG CAPS Take 1 capsule by mouth at bedtime    . metoprolol (LOPRESSOR) 50 MG tablet Take 1 tablet (50 mg total) by mouth 2 (two) times daily. 180 tablet 3  . NYSTATIN powder APPLY 1 GRAM TOPICALLY TO THE AFFECTED AREA DAILY AS NEEDED FOR ITCHING 60 g 0  . nystatin-triamcinolone (MYCOLOG II) cream Apply 1 application topically 4 (four) times daily as needed (for rash). Apply under  abdomen folds twice daily until rash healed 30 g 5  . omega-3 acid ethyl esters (LOVAZA) 1 g capsule TAKE ONE CAPSULE BY MOUTH EVERY DAY 30 capsule 0  . oxyCODONE (OXY IR/ROXICODONE) 5 MG immediate release tablet Take one tablet every 6 hours as needed for severe pain, Take 2 tablets at bedtime. 180 tablet 0  . polyvinyl alcohol (LIQUIFILM TEARS) 1.4 % ophthalmic solution Place 1 drop into both eyes as needed (for dry eyes).     . potassium chloride (MICRO-K) 10 MEQ CR capsule Take 2 capsules by mouth 2 (two) times daily. While on lasix    . promethazine (PHENERGAN) 12.5 MG tablet TAKE 1 TABLET BY MOUTH AS NEEDED FOR SEVERE NAUSEA AS DIRECTED 30 tablet 0  . SPIRIVA HANDIHALER 18 MCG inhalation capsule INHALE THE CONTENTS OF 1 CAPSULE VIA HANDIHALER BY MOUTH DAILY FOR BREATHING 30 capsule 3  . triamterene-hydrochlorothiazide (MAXZIDE-25) 37.5-25 MG tablet GIVE "Dazia" 1 TABLET BY MOUTH DAILY FOR BLOOD PRESSURE 30 tablet 0  . venlafaxine XR (EFFEXOR-XR) 75 MG 24 hr capsule Take 1 capsule (75 mg total) by mouth daily with breakfast. 90 capsule 3  . VICTOZA 18 MG/3ML SOPN ADMINISTER 1.8 MG UNDER THE SKIN DAILY FOR BLOOD SUGAR 9 mL 3  . XELJANZ 5 MG TABS TAKE 1 TABLET BY MOUTH TWICE DAILY 60 tablet 2  . furosemide (LASIX) 20 MG tablet Take 80mg  each morning and 40mg  each evening (Patient taking differently: Take 60mg  each morning and 40mg  each evening) 60 tablet  5  . lisinopril (PRINIVIL,ZESTRIL) 20 MG tablet Take 1 tablet (20 mg total) by mouth daily. 30 tablet 11   No facility-administered medications prior to visit.      Allergies:   Penicillins and Sulfamethoxazole-trimethoprim   Social History   Social History  . Marital status: Widowed    Spouse name: N/A  . Number of children: 2  . Years of education: N/A   Occupational History  . Retired    Social History Main Topics  . Smoking status: Former Smoker    Types: Cigarettes    Quit date: 12/07/1979  . Smokeless tobacco: Never Used  . Alcohol use No  . Drug use: No  . Sexual activity: No   Other Topics Concern  . None   Social History Narrative   Walks with cane     Family History:  The patient's family history includes Alzheimer's disease in her mother; Arrhythmia in her sister; Breast cancer in her sister; Cancer in her brother; Colon polyps in her brother; Heart disease in her father and mother; Liver disease in her father.   Review of Systems:   Please see the history of present illness.     General:  No chills, fever, night sweats or weight changes.  Cardiovascular:  No chest pain, orthopnea, palpitations, paroxysmal nocturnal dyspnea. Positive for dyspnea with exertion and edema.  Dermatological: No rash, masses. Positive for drainage along left great toe.  Respiratory: No cough, Positive for dyspnea Urologic: No hematuria, dysuria Abdominal:   No nausea, vomiting, diarrhea, bright red blood per rectum, melena, or hematemesis Neurologic:  No visual changes, wkns, changes in mental status. All other systems reviewed and are otherwise negative except as noted above.   Physical Exam:    VS:  BP (!) 188/74   Pulse 100   Ht 5\' 6"  (1.676 m)   Wt 282 lb 9.6 oz (128.2 kg)   BMI 45.61 kg/m    General: Well developed, obese Caucasian female appearing  in no acute distress. Head: Normocephalic, atraumatic, sclera non-icteric, no xanthomas, nares are without discharge.    Neck: No carotid bruits. JVD not elevated.  Lungs: Respirations regular and unlabored, without wheezes or rales.  Heart: Regular rate and rhythm. No S3 or S4.  No murmur, no rubs, or gallops appreciated. Abdomen: Soft, non-tender, non-distended with normoactive bowel sounds. No hepatomegaly. No rebound/guarding. No obvious abdominal masses. Msk:  Strength and tone appear normal for age. No joint deformities or effusions. Extremities: No clubbing or cyanosis. 2+ pitting edema bilaterally.  Distal pedal pulses are 2+ bilaterally. Neuro: Alert and oriented X 3. Moves all extremities spontaneously. No focal deficits noted. Psych:  Responds to questions appropriately with a normal affect. Skin: No rashes noted. Does have an ulcer along the bottom portion of her left great toe with yellow drainage noted. Toe is erythematous and warm to touch.   Wt Readings from Last 3 Encounters:  09/22/16 282 lb 9.6 oz (128.2 kg)  09/14/16 283 lb 8 oz (128.6 kg)  09/06/16 275 lb 12.8 oz (125.1 kg)     Studies/Labs Reviewed:   EKG:  EKG is not ordered today.   Recent Labs: 04/01/2016: TSH 0.23 07/07/2016: ALT 38 07/18/2016: Hemoglobin 14.3; Platelets 364 08/29/2016: Brain Natriuretic Peptide 58.2 09/06/2016: BUN 20; Creatinine, Ser 1.21; Magnesium 2.0; Potassium 4.3; Sodium 141   Lipid Panel    Component Value Date/Time   CHOL 143 04/01/2016 0900   CHOL 148 03/16/2015 0832   TRIG 85 04/01/2016 0900   HDL 46 04/01/2016 0900   HDL 38 (L) 03/16/2015 0832   CHOLHDL 3.1 04/01/2016 0900   VLDL 17 04/01/2016 0900   LDLCALC 80 04/01/2016 0900   LDLCALC 89 03/16/2015 0832    Additional studies/ records that were reviewed today include:   09/06/2016: EKG: Sinus tachycardia, HR 105, with no acute ST or T-wave changes.   Assessment:    1. Medication management   2. Weight gain   3. Acute congestive heart failure, unspecified congestive heart failure type ([place])     Plan:   In order of problems listed  above:  1. Dyspnea on Exertion/ Lower Extremity Edema/ Acute CHF of Unknown Type - Reports continued dyspnea with exertion and lower extremity edema. An echocardiogram and Lexiscan Myoview have been ordered and are scheduled for next week. - She was instructed to increase her Lasix from 40 mg BID to 80mg  in AM and 40mg  in PM, but has been taking 60mg  in AM and 40mg  in PM and reports significant urination. She is also on HCTZ as well due to her Maxzide dosing. With dual diuretic therapy (seen by Pharmacy and continued on this), would avoid further titration of her Lasix at this time. Recheck BMET today to assess creatinine.    2. Cellulitis of Left Great Toe - reports pain and swelling of her left great toe. On exam, there is an ulcer along the bottom portion of her left great toe with yellow drainage noted. Toe is erythematous and warm to touch. Will write for Keflex 500mg  BID for 7 days as I am concerned she has cellulitis and she is unable to be seen by her PCP until next week.  - Lower extremity dopplers were obtained earlier today with results pending.   3. Accelerated HTN - BP initially elevated 211/89, at 188/74 on recheck.  - will restart Amlodipine 10mg  daily (Had tried a lower dose in the past with no improvement in her edema - has not taken this in  the past two weeks due to not having any refills). - Continue Lisinopril 40 mg daily and Triamterene-HCTZ.   Medication Adjustments/Labs and Tests Ordered: Current medicines are reviewed at length with the patient today.  Concerns regarding medicines are outlined above.  Medication changes, Labs and Tests ordered today are listed in the Patient Instructions below. Patient Instructions  Medication Instructions:  START taking amlodipine 10 mg (1 tablet) one time daily.  START Keflex 500 mg (1 capsule) two times a day for 7 DAYS.  CONTINUE Lasix 60 mg (3 tablets) in the morning and 40 mg (2 tablets) in the evening.  CONTINUE lisinopril 40  mg (2 tablets) one time daily.    Labwork: Have lab work TODAY (BMET)  Testing/Procedures: NONE  Follow-Up: Keep follow up with Dr. Stanford Breed on 2/12 at 12:15 at [facility] office.  Any Other Special Instructions Will Be Listed Below (If Applicable).   If you need a refill on your cardiac medications before your next appointment, please call your pharmacy.      Weston Brass Erma Heritage, [place]  09/22/2016 5:16 PM    [facility] [address], [place], [place]  [address] Phone: [phone]; Fax: [phone]  [address], [place], [place] [address] Phone: [phone]

## 2016-09-23 ENCOUNTER — Other Ambulatory Visit: Payer: Self-pay | Admitting: *Deleted

## 2016-09-23 DIAGNOSIS — M545 Low back pain: Principal | ICD-10-CM

## 2016-09-23 DIAGNOSIS — G9332 Myalgic encephalomyelitis/chronic fatigue syndrome: Secondary | ICD-10-CM

## 2016-09-23 DIAGNOSIS — M797 Fibromyalgia: Secondary | ICD-10-CM

## 2016-09-23 DIAGNOSIS — R5382 Chronic fatigue, unspecified: Secondary | ICD-10-CM

## 2016-09-23 DIAGNOSIS — G8929 Other chronic pain: Secondary | ICD-10-CM

## 2016-09-23 LAB — BASIC METABOLIC PANEL
BUN: 14 mg/dL (ref 7–25)
CO2: 24 mmol/L (ref 20–31)
CREATININE: 0.93 mg/dL (ref 0.60–0.93)
Calcium: 9.5 mg/dL (ref 8.6–10.4)
Chloride: 103 mmol/L (ref 98–110)
Glucose, Bld: 189 mg/dL — ABNORMAL HIGH (ref 65–99)
POTASSIUM: 3.9 mmol/L (ref 3.5–5.3)
SODIUM: 141 mmol/L (ref 135–146)

## 2016-09-23 MED ORDER — OXYCODONE HCL 5 MG PO TABS: ORAL_TABLET | ORAL | 0 refills | Status: DC

## 2016-09-23 MED ORDER — HYDROCODONE-ACETAMINOPHEN 10-325 MG PO TABS: ORAL_TABLET | ORAL | 0 refills | Status: DC

## 2016-09-23 NOTE — Telephone Encounter (Signed)
Patient requested and will pick up 

## 2016-09-26 ENCOUNTER — Ambulatory Visit (HOSPITAL_COMMUNITY): Payer: Medicare Other | Attending: Cardiology

## 2016-09-26 ENCOUNTER — Ambulatory Visit (HOSPITAL_BASED_OUTPATIENT_CLINIC_OR_DEPARTMENT_OTHER): Payer: Medicare Other

## 2016-09-26 ENCOUNTER — Other Ambulatory Visit: Payer: Self-pay

## 2016-09-26 DIAGNOSIS — R0602 Shortness of breath: Secondary | ICD-10-CM | POA: Diagnosis not present

## 2016-09-26 MED ORDER — REGADENOSON 0.4 MG/5ML IV SOLN
0.4000 mg | Freq: Once | INTRAVENOUS | Status: AC
  Administered 2016-09-26: 0.4 mg via INTRAVENOUS

## 2016-09-26 MED ORDER — TECHNETIUM TC 99M TETROFOSMIN IV KIT
30.0000 | PACK | Freq: Once | INTRAVENOUS | Status: AC | PRN
  Administered 2016-09-26: 30 via INTRAVENOUS
  Filled 2016-09-26: qty 30

## 2016-09-26 NOTE — Progress Notes (Signed)
HPI: FU dyspnea. BNP 08/30/15 58. Nuclear study February 2018 showed EF 62 and no ischemia. Echocardiogram Feb 2018 showed normal LV systolic function and grade 1 DD. ABIs February 2018 mild bilaterally with diffuse disease. There was 30-49% bilateral CFAs and SFAs. Patient seen recently with dyspnea and noted to be edematous. Lasix was increased. Her blood pressure was elevated as well and amlodipine added. Since last seen she has some dyspnea on exertion. She also notes chest heaviness. This has improved since her diuretics were increased. She has had this intermittently for several years by report. Her pedal edema persists but there is some improvement.  Current Outpatient Prescriptions  Medication Sig Dispense Refill  . amLODipine (NORVASC) 10 MG tablet Take 1 tablet (10 mg total) by mouth daily. 180 tablet 3  . aspirin EC 81 MG tablet Take 81 mg by mouth daily as needed.     . Cholecalciferol (VITAMIN D) 2000 units tablet Take 2,000 Units by mouth daily.    . furosemide (LASIX) 20 MG tablet Take 60mg  each morning and 40mg  each evening 60 tablet 5  . gabapentin (NEURONTIN) 600 MG tablet TAKE 1 TABLET BY MOUTH THREE TIMES DAILY 90 tablet 0  . glucose blood (ACCU-CHEK SMARTVIEW) test strip Check blood sugar 3-4 times daily    . HYDROcodone-acetaminophen (NORCO) 10-325 MG tablet Take one tablet every three hours as needed for pain 240 tablet 0  . Insulin Glargine (TOUJEO SOLOSTAR) 300 UNIT/ML SOPN Inject 100 Units into the skin at bedtime. 1.5 mL 6  . Insulin Lispro (HUMALOG KWIKPEN) 200 UNIT/ML SOPN Inject 20 Units into the skin 3 (three) times daily before meals. 12 mL 5  . Insulin Syringe-Needle U-100 31G X 5/16" 1 ML MISC Use once daily with administration of Insulin DX E11.59 100 each 11  . levothyroxine (SYNTHROID, LEVOTHROID) 137 MCG tablet TAKE 1 TABLET BY MOUTH DAILY BEFORE BREAKFAST ON AN EMPTY STOMACH( LABS OVERDUE) 90 tablet 1  . lisinopril (PRINIVIL,ZESTRIL) 20 MG tablet Take 2  tablets (40 mg total) by mouth daily. 30 tablet 11  . loperamide (IMODIUM A-D) 2 MG tablet Take 2 mg by mouth 4 (four) times daily as needed for diarrhea or loose stools.    . magnesium oxide (MAG-OX) 400 MG tablet Take 400 mg by mouth daily as needed.    . Melatonin 10 MG CAPS Take 1 capsule by mouth at bedtime    . metoprolol (LOPRESSOR) 50 MG tablet Take 1 tablet (50 mg total) by mouth 2 (two) times daily. 180 tablet 3  . NYSTATIN powder APPLY 1 GRAM TOPICALLY TO THE AFFECTED AREA DAILY AS NEEDED FOR ITCHING 60 g 0  . nystatin-triamcinolone (MYCOLOG II) cream Apply 1 application topically 4 (four) times daily as needed (for rash). Apply under abdomen folds twice daily until rash healed 30 g 5  . omega-3 acid ethyl esters (LOVAZA) 1 g capsule TAKE ONE CAPSULE BY MOUTH EVERY DAY 30 capsule 0  . oxyCODONE (OXY IR/ROXICODONE) 5 MG immediate release tablet Take one tablet every 6 hours as needed for severe pain, Take 2 tablets at bedtime. 180 tablet 0  . polyvinyl alcohol (LIQUIFILM TEARS) 1.4 % ophthalmic solution Place 1 drop into both eyes as needed (for dry eyes).     . potassium chloride (MICRO-K) 10 MEQ CR capsule Take 2 capsules by mouth 2 (two) times daily. While on lasix    . promethazine (PHENERGAN) 12.5 MG tablet TAKE 1 TABLET BY MOUTH AS NEEDED FOR SEVERE  NAUSEA AS DIRECTED 30 tablet 0  . SPIRIVA HANDIHALER 18 MCG inhalation capsule INHALE THE CONTENTS OF 1 CAPSULE VIA HANDIHALER BY MOUTH DAILY FOR BREATHING 30 capsule 3  . triamterene-hydrochlorothiazide (MAXZIDE-25) 37.5-25 MG tablet GIVE "Cherissa" 1 TABLET BY MOUTH DAILY FOR BLOOD PRESSURE 30 tablet 0  . venlafaxine XR (EFFEXOR-XR) 75 MG 24 hr capsule Take 1 capsule (75 mg total) by mouth daily with breakfast. 90 capsule 3  . VICTOZA 18 MG/3ML SOPN ADMINISTER 1.8 MG UNDER THE SKIN DAILY FOR BLOOD SUGAR 9 mL 3  . XELJANZ 5 MG TABS TAKE 1 TABLET BY MOUTH TWICE DAILY 60 tablet 0   No current facility-administered medications for this visit.        Past Medical History:  Diagnosis Date  . Abnormal weight gain   . Abnormality of gait 04/19/2016  . Acute infective polyneuritis ([place]) 2002  . Acute maxillary sinusitis   . Acute sinusitis, unspecified   . Allergic rhinitis due to pollen   . Arthritis   . Back injury   . Candidiasis of skin and nails   . Candidiasis of vulva and vagina   . Chronic pain syndrome   . COPD (chronic obstructive pulmonary disease) ([place])   . Cough   . Degenerative arthritis   . Depressive disorder, not elsewhere classified   . Diabetes mellitus without complication ([place])   . Diaphragmatic hernia without mention of obstruction or gangrene   . Dyslipidemia   . Edema   . Fibromyalgia   . GERD (gastroesophageal reflux disease)   . Guillain-Barre syndrome ([place])   . History of benign thymus tumor   . Hypertension   . Insomnia, unspecified   . Lumbago   . Miscarriage 1962  . Mixed hyperlipidemia   . Morbid obesity ([place])   . Obstructive chronic bronchitis with acute bronchitis ([place])   . Osteopenia, senile   . Osteoporosis   . Other malaise and fatigue   . Other specified disease of white blood cells   . Pain in joint, multiple sites   . Polyneuropathy in diabetes(357.2)   . RA (rheumatoid arthritis) ([place])   . Reflux esophagitis   . Rheumatoid arthritis(714.0)   . Shortness of breath   . Spinal stenosis, lumbar region, without neurogenic claudication   . Spondylosis, lumbosacral   . Spontaneous ecchymoses   . Stiffness of joints, not elsewhere classified, multiple sites   . Tear film insufficiency, unspecified   . Thyroid disease   . Thyroid disorder   . Type II or unspecified type diabetes mellitus with peripheral circulatory disorders, uncontrolled(250.72)   . Unspecified chronic bronchitis   . Unspecified essential hypertension   . Unspecified hypothyroidism   . Unspecified pruritic disorder   . Unspecified urinary incontinence   . Urinary tract infection, site not specified      Past Surgical History:  Procedure Laterality Date  . ABDOMINAL HYSTERECTOMY  1974  . abdominal tumor  2002  . APPENDECTOMY    . [place]  . BRONCHOSCOPY  2001  . CHOLECYSTECTOMY  1984  . KNEE SURGERY Bilateral 08/27/2010 (L) and 01/11/2011 (R)  . [place]  . OVARIAN CYST SURGERY  1968  . thymus tumor    . 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 TONSILLECTOMY      Social History   Social History  . Marital status: Widowed    Spouse name: N/A  . Number of children: 2  . Years of education: N/A   Occupational History  .  Retired    Social History Main Topics  . Smoking status: Former Smoker    Types: Cigarettes    Quit date: 12/07/1979  . Smokeless tobacco: Never Used  . Alcohol use No  . Drug use: No  . Sexual activity: No   Other Topics Concern  . Not on file   Social History Narrative   Walks with cane    Family History  Problem Relation Age of Onset  . Alzheimer's disease Mother   . Heart disease Mother   . Heart disease Father   . Liver disease Father   . Breast cancer Sister   . Arrhythmia Sister   . Cancer Brother   . Colon polyps Brother   . Colon cancer Neg Hx   . Esophageal cancer Neg Hx   . Kidney disease Neg Hx   . Stomach cancer Neg Hx   . Rectal cancer Neg Hx     ROS: no fevers or chills, productive cough, hemoptysis, dysphasia, odynophagia, melena, hematochezia, dysuria, hematuria, rash, seizure activity, orthopnea, PND, claudication. Remaining systems are negative.  Physical Exam: Well-developed obese in no acute distress.  Skin is warm and dry.  HEENT is normal.  Neck is supple.  Chest is clear to auscultation with normal expansion.  Cardiovascular exam is regular rate and rhythm.  Abdominal exam nontender or distended. No masses palpated. Extremities show 1+ edema. neuro grossly intact  A/P  1 peripheral edema-likely multifactorial including diastolic dysfunction, possible RV dysfunction from sleep apnea and OHS. This is  somewhat improved. Continue present dose of diuretics. Check potassium and renal function. We discussed the importance of fluid restriction and sodium restriction. She is keeping her feet elevated and does use compression hose occasionally.  2 sleep apnea-we will ask pulmonary to see for further management.  3 hypertension-blood pressure improved but remains mildly elevated. Increase metoprolol to 75 mg twice a day.  4 palpitations-occasional skips. Increase metoprolol as outlined.   5 dyspnea-likely multifactorial including diastolic dysfunction, sleep apnea and OHS. Nuclear study showed no ischemia and LV function normal.    Kirk Ruths, MD

## 2016-09-27 ENCOUNTER — Ambulatory Visit (HOSPITAL_COMMUNITY): Payer: Medicare Other | Attending: Cardiology

## 2016-09-27 LAB — MYOCARDIAL PERFUSION IMAGING
CHL CUP NUCLEAR SSS: 1
CSEPPHR: 111 {beats}/min
LHR: 0.37
LV dias vol: 100 mL (ref 46–106)
LV sys vol: 38 mL
Rest HR: 100 {beats}/min
SDS: 1
SRS: 0
TID: 0.74

## 2016-09-27 MED ORDER — TECHNETIUM TC 99M TETROFOSMIN IV KIT
31.7000 | PACK | Freq: Once | INTRAVENOUS | Status: AC | PRN
  Administered 2016-09-27: 31.7 via INTRAVENOUS
  Filled 2016-09-27: qty 32

## 2016-09-28 ENCOUNTER — Other Ambulatory Visit: Payer: Self-pay | Admitting: Rheumatology

## 2016-09-28 NOTE — Telephone Encounter (Signed)
Last Visit: 07/06/16 Next Visit: 10/06/16 Labs: 07/06/16 WBC: 17.4 AST: 71 ALT : 44  Okay to refill Katherine Delgado?

## 2016-10-03 ENCOUNTER — Encounter: Payer: Self-pay | Admitting: Cardiology

## 2016-10-03 ENCOUNTER — Ambulatory Visit (INDEPENDENT_AMBULATORY_CARE_PROVIDER_SITE_OTHER): Payer: Medicare Other | Admitting: Cardiology

## 2016-10-03 ENCOUNTER — Other Ambulatory Visit: Payer: Self-pay | Admitting: Internal Medicine

## 2016-10-03 VITALS — BP 149/66 | HR 118 | Ht 66.0 in | Wt 278.6 lb

## 2016-10-03 DIAGNOSIS — R0609 Other forms of dyspnea: Secondary | ICD-10-CM | POA: Diagnosis not present

## 2016-10-03 DIAGNOSIS — I10 Essential (primary) hypertension: Secondary | ICD-10-CM

## 2016-10-03 DIAGNOSIS — G4730 Sleep apnea, unspecified: Secondary | ICD-10-CM

## 2016-10-03 DIAGNOSIS — R0602 Shortness of breath: Secondary | ICD-10-CM | POA: Diagnosis not present

## 2016-10-03 LAB — BASIC METABOLIC PANEL
BUN: 23 mg/dL (ref 7–25)
CO2: 26 mmol/L (ref 20–31)
CREATININE: 1 mg/dL — AB (ref 0.60–0.93)
Calcium: 9.7 mg/dL (ref 8.6–10.4)
Chloride: 100 mmol/L (ref 98–110)
Glucose, Bld: 172 mg/dL — ABNORMAL HIGH (ref 65–99)
Potassium: 4.3 mmol/L (ref 3.5–5.3)
Sodium: 138 mmol/L (ref 135–146)

## 2016-10-03 MED ORDER — METOPROLOL TARTRATE 50 MG PO TABS: 75.0000 mg | ORAL_TABLET | Freq: Two times a day (BID) | ORAL | 3 refills | Status: DC

## 2016-10-03 NOTE — Progress Notes (Signed)
Office Visit Note  Patient: Katherine Delgado             Date of Birth: 1941/05/12           MRN: [id number]             PCP: Hollace Kinnier, DO Referring: Gayland Curry, DO Visit Date: 10/06/2016 Occupation: @GUAROCC @    Subjective:  Follow-up Rheumatoid arthritis and high-risk prescription and fibromyalgia  History of Present Illness: Katherine Delgado is a 76 y.o. female  With history of RA that is CCP positive. Erosive disease. She does still have ongoing joint pain. Currently she is having left great toe pain which she considers is very painful.  She is on Xalatan 5 mg twice a day and we have offered methotrexate on previous visits and even the last visit but patient has chosen not to start methotrexate. At the last visit she did show desire to start the methotrexate and then changed her mind.  Patient has history of fibromyalgia and it is active disease but are tender points are not present at last visit   Activities of Daily Living:  Patient reports morning stiffness for 60 minutes.   Patient Reports nocturnal pain.  Difficulty dressing/grooming: Denies Difficulty climbing stairs: Reports Difficulty getting out of chair: Denies Difficulty using hands for taps, buttons, cutlery, and/or writing: Reports   Review of Systems  Constitutional: Negative for fatigue.  HENT: Negative for mouth sores and mouth dryness.   Eyes: Negative for dryness.  Respiratory: Negative for shortness of breath.   Gastrointestinal: Negative for constipation and diarrhea.  Musculoskeletal: Negative for myalgias and myalgias.  Skin: Negative for sensitivity to sunlight.  Psychiatric/Behavioral: Negative for decreased concentration and sleep disturbance.    PMFS History:  Patient Active Problem List   Diagnosis Date Noted  . Ulcer of toe of left foot, with necrosis of bone (Katherine Delgado) 07/25/2016  . Rheumatoid arthritis with positive rheumatoid factor (Katherine Delgado) 06/14/2016  . High risk medication use  06/14/2016  . Sjogren's syndrome (Katherine Delgado) 06/14/2016  . Primary osteoarthritis of both knees 06/14/2016  . Osteoarthritis of lumbar spine 06/14/2016  . Hypothyroidism 06/14/2016  . Osteoporosis 06/14/2016  . Abnormality of gait 04/19/2016  . Type 2 diabetes mellitus with diabetic polyneuropathy, with long-term current use of insulin (Katherine Delgado) 11/27/2015  . Chronic fatigue fibromyalgia syndrome 11/27/2015  . Severe obesity (BMI >= 40) (Katherine Delgado) 04/21/2014  . Type II or unspecified type diabetes mellitus with peripheral circulatory disorders, uncontrolled(250.72) 09/12/2013  . Osteopenia, senile   . Headache(784.0) 07/31/2013  . Sinus infection 07/31/2013  . Chronic low back pain 03/14/2013  . Insomnia 12/06/2012  . Nausea alone 12/06/2012  . Diarrhea 12/06/2012  . Urinary incontinence, urge 12/06/2012  . Lumbosacral root lesions, not elsewhere classified 11/27/2012  . Diabetic polyneuropathy associated with type 2 diabetes mellitus (Katherine Delgado) 11/27/2012  . EDEMA 05/04/2010  . YEAST INFECTION 05/03/2010  . DIABETES MELLITUS 05/03/2010  . Hyperlipidemia 05/03/2010  . OBESITY 05/03/2010  . DEPRESSION 05/03/2010  . PERIPHERAL NEUROPATHY 05/03/2010  . GUILLAIN-BARRE SYNDROME 05/03/2010  . Essential hypertension 05/03/2010  . ALLERGIC RHINITIS 05/03/2010  . PNEUMONIA 05/03/2010  . COPD 05/03/2010  . GERD 05/03/2010  . Fibromyalgia 05/03/2010  . DYSPNEA 05/03/2010  . CHEST PAIN 05/03/2010    Past Medical History:  Diagnosis Date  . Abnormal weight gain   . Abnormality of gait 04/19/2016  . Acute infective polyneuritis (Katherine Delgado) 2002  . Acute maxillary sinusitis   . Acute sinusitis, unspecified   .  Allergic rhinitis due to pollen   . Arthritis   . Back injury   . Candidiasis of skin and nails   . Candidiasis of vulva and vagina   . Chronic pain syndrome   . COPD (chronic obstructive pulmonary disease) ([place])   . Cough   . Degenerative arthritis   . Depressive disorder, not elsewhere classified    . Diabetes mellitus without complication (Katherine Delgado)   . Diaphragmatic hernia without mention of obstruction or gangrene   . Dyslipidemia   . Edema   . Fibromyalgia   . GERD (gastroesophageal reflux disease)   . Guillain-Barre syndrome (Katherine Delgado)   . History of benign thymus tumor   . Hypertension   . Insomnia, unspecified   . Lumbago   . Miscarriage 1962  . Mixed hyperlipidemia   . Morbid obesity (Katherine Delgado)   . Obstructive chronic bronchitis with acute bronchitis (Katherine Delgado)   . Osteopenia, senile   . Osteoporosis   . Other malaise and fatigue   . Other specified disease of white blood cells   . Pain in joint, multiple sites   . Polyneuropathy in diabetes(357.2)   . RA (rheumatoid arthritis) (Katherine Delgado)   . Reflux esophagitis   . Rheumatoid arthritis(714.0)   . Shortness of breath   . Spinal stenosis, lumbar region, without neurogenic claudication   . Spondylosis, lumbosacral   . Spontaneous ecchymoses   . Stiffness of joints, not elsewhere classified, multiple sites   . Tear film insufficiency, unspecified   . Thyroid disease   . Thyroid disorder   . Type II or unspecified type diabetes mellitus with peripheral circulatory disorders, uncontrolled(250.72)   . Unspecified chronic bronchitis   . Unspecified essential hypertension   . Unspecified hypothyroidism   . Unspecified pruritic disorder   . Unspecified urinary incontinence   . Urinary tract infection, site not specified     Family History  Problem Relation Age of Onset  . Alzheimer's disease Mother   . Heart disease Mother   . Heart disease Father   . Liver disease Father   . Breast cancer Sister   . Arrhythmia Sister   . Cancer Brother   . Colon polyps Brother   . Colon cancer Neg Hx   . Esophageal cancer Neg Hx   . Kidney disease Neg Hx   . Stomach cancer Neg Hx   . Rectal cancer Neg Hx    Past Surgical History:  Procedure Laterality Date  . ABDOMINAL HYSTERECTOMY  1974  . abdominal tumor  2002  . APPENDECTOMY    . [place]  . BRONCHOSCOPY  2001  . CHOLECYSTECTOMY  1984  . KNEE SURGERY Bilateral 08/27/2010 (L) and 01/11/2011 (R)  . [place]  . OVARIAN CYST SURGERY  1968  . thymus tumor    . 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 TONSILLECTOMY     Social History   Social History Narrative   Walks with cane     Objective: Vital Signs: BP 132/74   Pulse 78   Resp 16   Ht 5\' 6"  (1.676 m)   Wt 282 lb (127.9 kg)   BMI 45.52 kg/m    Physical Exam  Constitutional: She is oriented to person, place, and time. She appears well-developed and well-nourished.  HENT:  Head: Normocephalic and atraumatic.  Eyes: EOM are normal. Pupils are equal, round, and reactive to light.  Cardiovascular: Normal rate, regular rhythm and normal heart sounds.  Exam reveals no gallop and no friction  rub.   No murmur heard. Pulmonary/Chest: Effort normal and breath sounds normal. She has no wheezes. She has no rales.  Abdominal: Soft. Bowel sounds are normal. She exhibits no distension. There is no tenderness. There is no guarding. No hernia.  Musculoskeletal: Normal range of motion. She exhibits no edema, tenderness or deformity.  Lymphadenopathy:    She has no cervical adenopathy.  Neurological: She is alert and oriented to person, place, and time. Coordination normal.  Skin: Skin is warm and dry. Capillary refill takes less than 2 seconds. No rash noted.  Psychiatric: She has a normal mood and affect. Her behavior is normal.  Nursing note and vitals reviewed.    Musculoskeletal Exam:  Full range of motion of all joints grip strength is equal and strong bilaterally for myalgia tender fibromyalgia tender points are all absent  CDAI Exam: CDAI Homunculus Exam:   Swelling:  Left foot: 1st MTP  Joint Counts:  CDAI Tender Joint count: 0 CDAI Swollen Joint count: 0  Global Assessments:  Patient Global Assessment: 7 Provider Global Assessment: 7  CDAI Calculated Score: 14    Investigation: Findings:    Labs reviewed from October 2011 showing CBC with diff with elevated WBC 14.1. She did have an ulcer at that time.  CMP had elevated glucose 194.  Sed rate 30.  ANA positive 1:40 titer with speckled pattern.  Her hepatitis panel was negative.  Anti CCP antibody elevated 100.     Labs from December 2016 show CBC and CMP are normal.  09/07/2015   X-rays of bilateral hands, 2 views today, showed bilateral severe intercarpal joint and radiocarpal joint narrowing more so on the left than the right wrist.  Right 2nd MCP and left 3rd MCP narrowing and all DIP narrowing.  There was no comparison available.   09/07/2015  Bilateral feet x-rays showed PIP and DIP narrowing, bilateral 1st MTP narrowing, and erosive changes in the bilateral 5th MTP joints, but no comparison was available.     Imaging: No results found.            Speciality Comments: No specialty comments available.    Procedures:  No procedures performed Allergies: Penicillins and Sulfamethoxazole-trimethoprim   Assessment / Plan:     Visit Diagnoses: High risk medication use - Plan: Quantiferon tb gold assay (blood)  Fibromyalgia  Rheumatoid arthritis involving multiple sites with positive rheumatoid factor (HCC) - +CCP  Age-related osteoporosis without current pathological fracture  Osteopenia, senile  Other osteoarthritis of spine, lumbar region  Chronic fatigue fibromyalgia syndrome  Other insomnia  Great toe pain, left - Plan: CANCELED: Uric acid  Extensor tenosynovitis of left wrist - Plan: Ambulatory referral to Hand Surgery   Plan: #1: Rheumatoid arthritis. Using Zeldin's 5 mg twice a day with ongoing flares. Patient has hesitation on using methotrexate yet due to the flares I have again recommended the patient try low-dose methotrexate. I suspect that if she tolerates methotrexate well, we may be able to encourage the patient to use a more therapeutic dose if needed. Of course patient will have to  make the final decision and we will only advise OB the best treatment option for the patient. I am concerned about the ongoing flare and synovitis that she's had in the past as well as currently having left great toe to be painful and swollen.  #2: Patient also has bilateral edema. She is on Lasix but it is not adequately addressing her needs.   #3: Patient also has extensor  tenosynovitis of the left wrist. IT HAs been present for some time now. We had made a referral in the past but due to financial issues, patient was unable to go there. Due to the nature of this lesion, patient should now urgently have this taken care of and we will make a new referral to a new location so patient can have this addressed. Patient is agreeable.  #4: TB gold due today.  #5: Return to clinic in 3 months so we can confirm that patient is having better control of her rheumatoid arthritis. Were hopeful that methotrexate will be able to get her disease under better control and patient is not having any side effects from methotrexate use.  Orders: Orders Placed This Encounter  Procedures  . Quantiferon tb gold assay (blood)  . Ambulatory referral to Hand Surgery   No orders of the defined types were placed in this encounter.   Face-to-face time spent with patient was 30 minutes. 50% of time was spent in counseling and coordination of care.  Follow-Up Instructions: Return in about 3 months (around 01/03/2017).   Eliezer Lofts, PA-C  Note - This record has been created using [employer].  Chart creation errors have been sought, but may not always  have been located. Such creation errors do not reflect on  the standard of medical care.

## 2016-10-03 NOTE — Patient Instructions (Signed)
Medication Instructions:   INCREASE METOPROLOL TO 75 MG TWICE DAILY= 1 AND 1/2 OF THE 50 MG TABLET TWICE DAILY  Labwork:  Your physician recommends that you HAVE LAB WORK TODAY  Follow-Up:  Your physician recommends that you schedule a follow-up appointment in: 3 MONTHS WITH DR [place]

## 2016-10-06 ENCOUNTER — Encounter: Payer: Self-pay | Admitting: Rheumatology

## 2016-10-06 ENCOUNTER — Other Ambulatory Visit: Payer: Self-pay | Admitting: Internal Medicine

## 2016-10-06 ENCOUNTER — Ambulatory Visit (INDEPENDENT_AMBULATORY_CARE_PROVIDER_SITE_OTHER): Payer: Medicare Other | Admitting: Rheumatology

## 2016-10-06 ENCOUNTER — Other Ambulatory Visit: Payer: Medicare Other

## 2016-10-06 VITALS — BP 132/74 | HR 78 | Resp 16 | Ht 66.0 in | Wt 282.0 lb

## 2016-10-06 DIAGNOSIS — M79675 Pain in left toe(s): Secondary | ICD-10-CM

## 2016-10-06 DIAGNOSIS — M65842 Other synovitis and tenosynovitis, left hand: Secondary | ICD-10-CM | POA: Diagnosis not present

## 2016-10-06 DIAGNOSIS — G9332 Myalgic encephalomyelitis/chronic fatigue syndrome: Secondary | ICD-10-CM

## 2016-10-06 DIAGNOSIS — E1142 Type 2 diabetes mellitus with diabetic polyneuropathy: Secondary | ICD-10-CM

## 2016-10-06 DIAGNOSIS — M47896 Other spondylosis, lumbar region: Secondary | ICD-10-CM | POA: Diagnosis not present

## 2016-10-06 DIAGNOSIS — M810 Age-related osteoporosis without current pathological fracture: Secondary | ICD-10-CM | POA: Diagnosis not present

## 2016-10-06 DIAGNOSIS — M65932 Unspecified synovitis and tenosynovitis, left forearm: Secondary | ICD-10-CM

## 2016-10-06 DIAGNOSIS — G4709 Other insomnia: Secondary | ICD-10-CM | POA: Diagnosis not present

## 2016-10-06 DIAGNOSIS — Z79899 Other long term (current) drug therapy: Secondary | ICD-10-CM

## 2016-10-06 DIAGNOSIS — M65832 Other synovitis and tenosynovitis, left forearm: Secondary | ICD-10-CM

## 2016-10-06 DIAGNOSIS — R5382 Chronic fatigue, unspecified: Secondary | ICD-10-CM | POA: Diagnosis not present

## 2016-10-06 DIAGNOSIS — Z794 Long term (current) use of insulin: Secondary | ICD-10-CM

## 2016-10-06 DIAGNOSIS — M0579 Rheumatoid arthritis with rheumatoid factor of multiple sites without organ or systems involvement: Secondary | ICD-10-CM

## 2016-10-06 DIAGNOSIS — M797 Fibromyalgia: Secondary | ICD-10-CM

## 2016-10-06 LAB — COMPLETE METABOLIC PANEL WITH GFR
ALT: 33 U/L — ABNORMAL HIGH (ref 6–29)
AST: 37 U/L — ABNORMAL HIGH (ref 10–35)
Albumin: 3.7 g/dL (ref 3.6–5.1)
Alkaline Phosphatase: 100 U/L (ref 33–130)
BUN: 17 mg/dL (ref 7–25)
CO2: 27 mmol/L (ref 20–31)
Calcium: 9.4 mg/dL (ref 8.6–10.4)
Chloride: 101 mmol/L (ref 98–110)
Creat: 0.8 mg/dL (ref 0.60–0.93)
GFR, Est African American: 83 mL/min (ref >=60)
GFR, Est Non African American: 72 mL/min (ref >=60)
Glucose, Bld: 178 mg/dL — ABNORMAL HIGH (ref 65–99)
Potassium: 4.5 mmol/L (ref 3.5–5.3)
Sodium: 139 mmol/L (ref 135–146)
Total Bilirubin: 0.4 mg/dL (ref 0.2–1.2)
Total Protein: 6.7 g/dL (ref 6.1–8.1)

## 2016-10-06 NOTE — Progress Notes (Signed)
Rheumatology Medication Review by a Pharmacist Does the patient feel that his/her medications are working for him/her?  Yes  Has the patient been experiencing any side effects to the medications prescribed?  No Does the patient have any problems obtaining medications?  No  Issues to address at subsequent visits: None   Pharmacist comments:  Katherine Delgado is a pleasant 76 yo F who presents for follow up on rheumatoid arthritis.  She is currently taking Xeljanz 5 mg twice daily.  At last visit on 07/06/16, patient was prescribed methotrexate 0.6 mL weekly and folic acid.  She reports she did not start the medication.  Patient had standing labs drawn today which are still pending.  Most recent TB Gold was on 01/31/16.  Patient will be due for TB Gold again in June 2018.  Most recent lipid panel was on 04/01/16 which was normal.  Patient denies any medication related questions at this time.     [name], Pharm.D., BCPS, CPP Clinical Pharmacist Pager: [phone] Phone: [phone] 10/06/2016 10:58 AM

## 2016-10-07 LAB — HEMOGLOBIN A1C
Hgb A1c MFr Bld: 7.6 % — ABNORMAL HIGH (ref <5.7)
Mean Plasma Glucose: 171 mg/dL

## 2016-10-07 LAB — URIC ACID: Uric Acid, Serum: 5.7 mg/dL (ref 2.5–7.0)

## 2016-10-10 ENCOUNTER — Encounter: Payer: Self-pay | Admitting: Pharmacotherapy

## 2016-10-10 ENCOUNTER — Ambulatory Visit (INDEPENDENT_AMBULATORY_CARE_PROVIDER_SITE_OTHER): Payer: Medicare Other | Admitting: Pharmacotherapy

## 2016-10-10 ENCOUNTER — Ambulatory Visit: Payer: Medicare Other | Admitting: Internal Medicine

## 2016-10-10 VITALS — BP 134/82 | HR 96 | Resp 12 | Ht 66.0 in | Wt 279.0 lb

## 2016-10-10 DIAGNOSIS — I10 Essential (primary) hypertension: Secondary | ICD-10-CM | POA: Diagnosis not present

## 2016-10-10 DIAGNOSIS — E1142 Type 2 diabetes mellitus with diabetic polyneuropathy: Secondary | ICD-10-CM

## 2016-10-10 DIAGNOSIS — Z794 Long term (current) use of insulin: Secondary | ICD-10-CM | POA: Diagnosis not present

## 2016-10-10 NOTE — Patient Instructions (Signed)
Drink more water!  Consider water exercise programs.

## 2016-10-10 NOTE — Progress Notes (Signed)
  Subjective:    Katherine Delgado is a 76 y.o.white female who presents for follow-up of Type 2 diabetes mellitus.   A1C:  7.6% (was 7.5%) Forgot blood glucose meter. She says BG have been "so-so" Hypoglycemia is rare. Has been having studies and medication changes.   Toujeo 100 units daily Humalog 20 units with each meal. Victoza 1.8mg  daily Continues to have problems with peripheral edema.  Has improved.  On high dose furosemide and continues to take triamterene/HCTZ.  The toe ulcer has callused over. Also being evaluated by rheum.  Uric acid was normal.  Has been cutting sodium. She is not drinking water, drinking coffee and tea. Generally, makes healthy food choices. No routine exercise. Eye exam is due.  Has a cataract on right eye. Has peripheral neuropathy. Nocturia twice per night.   No dysuria.  Review of Systems A comprehensive review of systems was negative except for: Eyes: positive for cataracts Cardiovascular: positive for lower extremity edema Genitourinary: positive for nocturia Neurological: positive for peripheral neuropathy    Objective:    BP 134/82   Pulse 96   Resp 12   Ht 5\' 6"  (1.676 m)   Wt 279 lb (126.6 kg)   SpO2 95%   BMI 45.03 kg/m   General:  alert and no distress  Oropharynx: normal findings: lips normal without lesions and gums healthy   Eyes:  negative findings: lids and lashes normal   Ears:  external ears normal        Lung: clear to auscultation bilaterally  Heart:  regular rate and rhythm     Extremities: edema bilateral lower extremities  Skin: warm and dry, no hyperpigmentation, vitiligo, or suspicious lesions     Neuro: mental status, speech normal, alert and oriented x3 and gait and station normal   Lab Review Glucose, Bld (mg/dL)  Date Value  10/06/2016 178 (H)  10/03/2016 172 (H)  09/22/2016 189 (H)   CO2 (mmol/L)  Date Value  10/06/2016 27  10/03/2016 26  09/22/2016 24   BUN (mg/dL)  Date Value  10/06/2016  17  10/03/2016 23  09/22/2016 14  09/06/2016 20  12/28/2015 24  09/25/2015 20   Creat (mg/dL)  Date Value  10/06/2016 0.80  10/03/2016 1.00 (H)  09/22/2016 0.93       Assessment:    Diabetes Mellitus type II, under good control.   A1C, while above goal <7%, is good considering recent health issues. BP at goal <140/90   Plan:    1.  Rx changes: none  2.  Continue Toujeo 100 units daily. 3.  Continue Humalog 20 units with each meal. 4.  Continue Victoza 1.8mg  daily. 5.  Counseled on importance of hydration with WATER. 6.  Counseled on nutrition goals. 7.  Counseled on need for routine exercise.  Goal is 30-45 minutes 5 x week.  Recommended she consider water exercise. 8.  BP at goal <140/90

## 2016-10-13 ENCOUNTER — Ambulatory Visit: Payer: Medicare Other | Admitting: Internal Medicine

## 2016-10-20 ENCOUNTER — Other Ambulatory Visit: Payer: Self-pay | Admitting: Internal Medicine

## 2016-10-21 ENCOUNTER — Other Ambulatory Visit: Payer: Self-pay | Admitting: *Deleted

## 2016-10-21 ENCOUNTER — Other Ambulatory Visit: Payer: Self-pay | Admitting: Internal Medicine

## 2016-10-21 DIAGNOSIS — R5382 Chronic fatigue, unspecified: Principal | ICD-10-CM

## 2016-10-21 DIAGNOSIS — G9332 Myalgic encephalomyelitis/chronic fatigue syndrome: Secondary | ICD-10-CM

## 2016-10-21 DIAGNOSIS — M797 Fibromyalgia: Secondary | ICD-10-CM

## 2016-10-21 DIAGNOSIS — M545 Low back pain: Secondary | ICD-10-CM

## 2016-10-21 DIAGNOSIS — G8929 Other chronic pain: Secondary | ICD-10-CM

## 2016-10-21 MED ORDER — HYDROCODONE-ACETAMINOPHEN 10-325 MG PO TABS: ORAL_TABLET | ORAL | 0 refills | Status: DC

## 2016-10-21 MED ORDER — OXYCODONE HCL 5 MG PO TABS: ORAL_TABLET | ORAL | 0 refills | Status: DC

## 2016-10-21 NOTE — Telephone Encounter (Signed)
Patient informed rx signed and ready for pickup  

## 2016-10-27 ENCOUNTER — Other Ambulatory Visit: Payer: Self-pay | Admitting: Rheumatology

## 2016-10-27 NOTE — Telephone Encounter (Signed)
Last Visit: 10/06/16 Next visit: 01/03/17 Labs: 10/06/16 Elevated Glucose AST 37 ALT 33  Okay to refill Morrie Sheldon?

## 2016-11-03 ENCOUNTER — Other Ambulatory Visit: Payer: Self-pay | Admitting: Internal Medicine

## 2016-11-08 ENCOUNTER — Other Ambulatory Visit: Payer: Self-pay | Admitting: Internal Medicine

## 2016-11-14 ENCOUNTER — Telehealth: Payer: Self-pay | Admitting: Rheumatology

## 2016-11-14 ENCOUNTER — Other Ambulatory Visit: Payer: Self-pay | Admitting: Internal Medicine

## 2016-11-14 NOTE — Telephone Encounter (Signed)
Patient states she spoke to someone at Dr. Bertis Ruddy office and they are needing records to go along with her referral and they are requesting that our office make the appointment as well.   Patient is also requesting a refill of MTX and folic acid be sent to [facility] on [facility].

## 2016-11-14 NOTE — Telephone Encounter (Signed)
We are restarting the methotrexate for this patient.Look at November 2017 Epic visit.She is on 0.6 mL's once a week.; Dispense 90 day supply with preservatives with no refill and tuberculin syringe as per usual protocol dispense 12 with 4 refills of tuberculin syringeMake sure her folic acid is there also. 2 mg every day. Dispense 90 day supply with 4 refills

## 2016-11-14 NOTE — Telephone Encounter (Signed)
I do not see where we have sent in a prescription for the MTX since 12/2015. Will you look in to this.

## 2016-11-15 MED ORDER — FOLIC ACID 1 MG PO TABS: 2.0000 mg | ORAL_TABLET | Freq: Every day | ORAL | 4 refills | Status: DC

## 2016-11-15 MED ORDER — "TUBERCULIN-ALLERGY SYRINGES 27G X 1/2"" 1 ML MISC": 1.0000 | 4 refills | Status: DC

## 2016-11-15 MED ORDER — METHOTREXATE SODIUM CHEMO INJECTION 50 MG/2ML: 15.0000 mg | INTRAMUSCULAR | 0 refills | Status: DC

## 2016-11-16 ENCOUNTER — Telehealth: Payer: Self-pay | Admitting: Rheumatology

## 2016-11-16 ENCOUNTER — Institutional Professional Consult (permissible substitution): Payer: Medicare Other | Admitting: Pulmonary Disease

## 2016-11-16 NOTE — Telephone Encounter (Signed)
Katherine Delgado w/Dr. Bertis Ruddy office called to let you know that patient has appt April 12th, 2018 @ 11am.  Katherine Delgado states patient is aware of appt time and date.  Any questions, please call [phone]

## 2016-11-17 ENCOUNTER — Other Ambulatory Visit: Payer: Self-pay | Admitting: Internal Medicine

## 2016-11-21 ENCOUNTER — Ambulatory Visit (INDEPENDENT_AMBULATORY_CARE_PROVIDER_SITE_OTHER): Payer: Medicare Other | Admitting: Internal Medicine

## 2016-11-21 ENCOUNTER — Encounter: Payer: Self-pay | Admitting: Internal Medicine

## 2016-11-21 VITALS — BP 130/80 | HR 100 | Temp 98.6°F | Wt 273.0 lb

## 2016-11-21 DIAGNOSIS — M545 Low back pain: Secondary | ICD-10-CM

## 2016-11-21 DIAGNOSIS — M7062 Trochanteric bursitis, left hip: Secondary | ICD-10-CM

## 2016-11-21 DIAGNOSIS — L659 Nonscarring hair loss, unspecified: Secondary | ICD-10-CM | POA: Diagnosis not present

## 2016-11-21 DIAGNOSIS — G8929 Other chronic pain: Secondary | ICD-10-CM

## 2016-11-21 DIAGNOSIS — R5382 Chronic fatigue, unspecified: Secondary | ICD-10-CM | POA: Diagnosis not present

## 2016-11-21 DIAGNOSIS — G9332 Myalgic encephalomyelitis/chronic fatigue syndrome: Secondary | ICD-10-CM

## 2016-11-21 DIAGNOSIS — R300 Dysuria: Secondary | ICD-10-CM

## 2016-11-21 DIAGNOSIS — M797 Fibromyalgia: Secondary | ICD-10-CM

## 2016-11-21 LAB — POCT URINALYSIS DIPSTICK
Bilirubin, UA: NEGATIVE
Blood, UA: NEGATIVE
Glucose, UA: NEGATIVE
Ketones, UA: NEGATIVE
Leukocytes, UA: NEGATIVE
Nitrite, UA: NEGATIVE
Protein, UA: NEGATIVE
Spec Grav, UA: 1.02 (ref 1.030–1.035)
Urobilinogen, UA: NEGATIVE (ref ?–2.0)
pH, UA: 6 (ref 5.0–8.0)

## 2016-11-21 MED ORDER — NYSTATIN-TRIAMCINOLONE 100000-0.1 UNIT/GM-% EX CREA: 1.0000 "application " | TOPICAL_CREAM | Freq: Four times a day (QID) | CUTANEOUS | 5 refills | Status: DC | PRN

## 2016-11-21 MED ORDER — HYDROCODONE-ACETAMINOPHEN 10-325 MG PO TABS: ORAL_TABLET | ORAL | 0 refills | Status: DC

## 2016-11-21 MED ORDER — CIPROFLOXACIN HCL 500 MG PO TABS: 500.0000 mg | ORAL_TABLET | Freq: Two times a day (BID) | ORAL | 0 refills | Status: DC

## 2016-11-21 MED ORDER — OXYCODONE HCL 5 MG PO TABS: ORAL_TABLET | ORAL | 0 refills | Status: DC

## 2016-11-21 NOTE — Progress Notes (Signed)
Location:  [facility] clinic Provider: Nelsy Madonna L. Mariea Clonts, D.O., C.M.D.  Code Status: DNR Goals of Care:  Advanced Directives 11/21/2016  Does Patient Have a Medical Advance Directive? Yes  Type of Advance Directive Out of facility DNR (pink MOST or yellow form)  Does patient want to make changes to medical advance directive? -  Copy of [place] in Chart? -  Would patient like information on creating a medical advance directive? -  Pre-existing out of facility DNR order (yellow form or pink MOST form) Pink MOST form placed in chart (order not valid for inpatient use)     Chief Complaint  Patient presents with  . Acute Visit    UTI    HPI: Patient is a 76 y.o. female seen today for an acute visit for UTI.   Has been having some problems with urinary leakage and urgency before the infection sx. Now is having dysuria, frequency x 2 weeks. No hematuria. Urine is cloudy, with odor. Tried to get appointment last week. It has gotten some better. She had been trying to drink a lot of water. Does not have frequent UTIs. Does have bilateral flank pain. Denies n/v. Reports having some chills over 1 week ago. She does report having so many other problems that it is hard to contribute what to what.   Past Medical History:  Diagnosis Date  . Abnormal weight gain   . Abnormality of gait 04/19/2016  . Acute infective polyneuritis ([place]) 2002  . Acute maxillary sinusitis   . Acute sinusitis, unspecified   . Allergic rhinitis due to pollen   . Arthritis   . Back injury   . Candidiasis of skin and nails   . Candidiasis of vulva and vagina   . Chronic pain syndrome   . COPD (chronic obstructive pulmonary disease) ([place])   . Cough   . Degenerative arthritis   . Depressive disorder, not elsewhere classified   . Diabetes mellitus without complication ([place])   . Diaphragmatic hernia without mention of obstruction or gangrene   . Dyslipidemia   . Edema   . Fibromyalgia   . GERD  (gastroesophageal reflux disease)   . Guillain-Barre syndrome ([place])   . History of benign thymus tumor   . Hypertension   . Insomnia, unspecified   . Lumbago   . Miscarriage 1962  . Mixed hyperlipidemia   . Morbid obesity ([place])   . Obstructive chronic bronchitis with acute bronchitis ([place])   . Osteopenia, senile   . Osteoporosis   . Other malaise and fatigue   . Other specified disease of white blood cells   . Pain in joint, multiple sites   . Polyneuropathy in diabetes(357.2)   . RA (rheumatoid arthritis) ([place])   . Reflux esophagitis   . Rheumatoid arthritis(714.0)   . Shortness of breath   . Spinal stenosis, lumbar region, without neurogenic claudication   . Spondylosis, lumbosacral   . Spontaneous ecchymoses   . Stiffness of joints, not elsewhere classified, multiple sites   . Tear film insufficiency, unspecified   . Thyroid disease   . Thyroid disorder   . Type II or unspecified type diabetes mellitus with peripheral circulatory disorders, uncontrolled(250.72)   . Unspecified chronic bronchitis   . Unspecified essential hypertension   . Unspecified hypothyroidism   . Unspecified pruritic disorder   . Unspecified urinary incontinence   . Urinary tract infection, site not specified     Past Surgical History:  Procedure Laterality Date  .  ABDOMINAL HYSTERECTOMY  1974  . abdominal tumor  2002  . APPENDECTOMY    . [place]  . BRONCHOSCOPY  2001  . CHOLECYSTECTOMY  1984  . KNEE SURGERY Bilateral 08/27/2010 (L) and 01/11/2011 (R)  . [place]  . OVARIAN CYST SURGERY  1968  . thymus tumor    . 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 TONSILLECTOMY      Allergies  Allergen Reactions  . Penicillins Hives, Itching, Swelling and Rash  . Sulfamethoxazole-Trimethoprim Itching    Allergies as of 11/21/2016      Reactions   Penicillins Hives, Itching, Swelling, Rash   Sulfamethoxazole-trimethoprim Itching      Medication List       Accurate as of 11/21/16  3:15 PM.  Always use your most recent med list.          ACCU-CHEK SMARTVIEW test strip Generic drug:  glucose blood Check blood sugar 3-4 times daily   amLODipine 10 MG tablet Commonly known as:  NORVASC Take 1 tablet (10 mg total) by mouth daily.   aspirin EC 81 MG tablet Take 81 mg by mouth daily as needed.   folic acid 1 MG tablet Commonly known as:  FOLVITE Take 2 tablets (2 mg total) by mouth daily.   furosemide 20 MG tablet Commonly known as:  LASIX Take 60mg  each morning and 40mg  each evening   gabapentin 600 MG tablet Commonly known as:  NEURONTIN TAKE 1 TABLET BY MOUTH THREE TIMES DAILY   HYDROcodone-acetaminophen 10-325 MG tablet Commonly known as:  NORCO Take one tablet every three hours as needed for pain   Insulin Lispro 200 UNIT/ML Sopn Commonly known as:  HUMALOG KWIKPEN Inject 20 Units into the skin 3 (three) times daily before meals.   Insulin Syringe-Needle U-100 31G X 5/16" 1 ML Misc Use once daily with administration of Insulin DX E11.59   levothyroxine 137 MCG tablet Commonly known as:  SYNTHROID, LEVOTHROID TAKE 1 TABLET BY MOUTH DAILY BEFORE BREAKFAST ON AN EMPTY STOMACH( LABS OVERDUE)   lisinopril 20 MG tablet Commonly known as:  PRINIVIL,ZESTRIL Take 2 tablets (40 mg total) by mouth daily.   loperamide 2 MG tablet Commonly known as:  IMODIUM A-D Take 2 mg by mouth 4 (four) times daily as needed for diarrhea or loose stools.   magnesium oxide 400 MG tablet Commonly known as:  MAG-OX Take 400 mg by mouth daily as needed.   Melatonin 10 MG Caps Take 1 capsule by mouth at bedtime   methotrexate 50 MG/2ML injection Inject 0.6 mLs (15 mg total) into the skin once a week. Dispense 2 mL vial with preservatives.   metoprolol 50 MG tablet Commonly known as:  LOPRESSOR Take 1.5 tablets (75 mg total) by mouth 2 (two) times daily.   nystatin powder Generic drug:  nystatin APPLY 1 GRAM TOPICALLY TO THE AFFECTED AREA DAILY AS NEEDED FOR ITCHING     nystatin-triamcinolone cream Commonly known as:  MYCOLOG II Apply 1 application topically 4 (four) times daily as needed (for rash). Apply under abdomen folds twice daily until rash healed   omega-3 acid ethyl esters 1 g capsule Commonly known as:  LOVAZA TAKE ONE CAPSULE BY MOUTH EVERY DAY   oxyCODONE 5 MG immediate release tablet Commonly known as:  Oxy IR/ROXICODONE Take one tablet every 6 hours as needed for severe pain, Take 2 tablets at bedtime.   polyvinyl alcohol 1.4 % ophthalmic solution Commonly known as:  LIQUIFILM TEARS Place 1  drop into both eyes as needed (for dry eyes).   potassium chloride 10 MEQ CR capsule Commonly known as:  MICRO-K Take 2 capsules by mouth 2 (two) times daily. While on lasix   promethazine 12.5 MG tablet Commonly known as:  PHENERGAN TAKE 1 TABLET BY MOUTH AS NEEDED FOR SEVERE NAUSEA AS DIRECTED   SPIRIVA HANDIHALER 18 MCG inhalation capsule Generic drug:  tiotropium INHALE THE CONTENTS OF 1 CAPSULE VIA HANDIHALER BY MOUTH DAILY FOR BREATHING   TOUJEO SOLOSTAR 300 UNIT/ML Sopn Generic drug:  Insulin Glargine INJECT 100 UNITS UNDER THE SKIN AT BEDTIME   triamterene-hydrochlorothiazide 37.5-25 MG tablet Commonly known as:  MAXZIDE-25 GIVE "Nichelle" 1 TABLET BY MOUTH DAILY FOR BLOOD PRESSURE   Tuberculin-Allergy Syringes 27G X 1/2" 1 ML Misc 1 Syringe by Does not apply route once a week.   venlafaxine XR 75 MG 24 hr capsule Commonly known as:  EFFEXOR-XR Take 1 capsule (75 mg total) by mouth daily with breakfast.   VICTOZA 18 MG/3ML Sopn Generic drug:  liraglutide ADMINISTER 1.8 MG UNDER THE SKIN DAILY FOR BLOOD SUGAR   Vitamin D 2000 units tablet Take 2,000 Units by mouth daily.   XELJANZ 5 MG Tabs Generic drug:  Tofacitinib Citrate TAKE 1 TABLET BY MOUTH TWICE DAILY       Review of Systems:  Review of Systems  Constitutional: Positive for chills. Negative for fever.  Respiratory: Negative for cough and shortness of breath.    Cardiovascular: Negative for chest pain and palpitations.  Gastrointestinal: Negative for nausea and vomiting.  Genitourinary: Positive for dysuria, flank pain, frequency and urgency.       Urine is cloudy, malodorous.   Neurological: Negative for weakness.    Health Maintenance  Topic Date Due  . FOOT EXAM  09/27/2016  . OPHTHALMOLOGY EXAM  08/21/2017 (Originally 12/20/1950)  . INFLUENZA VACCINE  08/22/2019 (Originally 03/22/2017)  . HEMOGLOBIN A1C  04/05/2017  . PNA vac Low Risk Adult (2 of 2 - PPSV23) 07/07/2017  . COLONOSCOPY  02/10/2025  . DEXA SCAN  09/20/2025  . TETANUS/TDAP  05/28/2026    Physical Exam: Vitals:   11/21/16 1508  BP: 130/80  Pulse: 100  Temp: 98.6 F (37 C)  TempSrc: Oral  SpO2: 98%  Weight: 273 lb (123.8 kg)   Body mass index is 44.06 kg/m. Physical Exam  Constitutional: She is oriented to person, place, and time. She appears well-developed and well-nourished.  Cardiovascular: Regular rhythm and normal heart sounds.   No murmur heard. HR 100   Pulmonary/Chest: Effort normal and breath sounds normal. No respiratory distress.  Abdominal: There is tenderness.  Genitourinary:  Genitourinary Comments: Mild suprapubic tenderness. No CVA tenderness.   Musculoskeletal: She exhibits tenderness. She exhibits no edema.  Bilateral flank pain, tender to palpation  Neurological: She is alert and oriented to person, place, and time.    Labs reviewed: Basic Metabolic Panel:  Recent Labs  04/01/16 0900  09/06/16 1514 09/22/16 1455 10/03/16 1330 10/06/16 0835  NA 142  < > 141 141 138 139  K 3.9  < > 4.3 3.9 4.3 4.5  CL 104  < > 96 103 100 101  CO2 28  < > 20 24 26 27   GLUCOSE 63*  < > 228* 189* 172* 178*  BUN 22  < > 20 14 23 17   CREATININE 0.82  < > 1.21* 0.93 1.00* 0.80  CALCIUM 9.4  < > 9.8 9.5 9.7 9.4  MG  --   --  2.0  --   --   --   TSH 0.23*  --   --   --   --   --   < > = values in this interval not displayed. Liver Function  Tests:  Recent Labs  07/06/16 1456 07/07/16 0830 10/06/16 0835  AST 71* 54* 37*  ALT 44* 38* 33*  ALKPHOS 104 99 100  BILITOT 0.4 0.5 0.4  PROT 7.0 6.7 6.7  ALBUMIN 4.2 3.9 3.7   No results for input(s): LIPASE, AMYLASE in the last 8760 hours. No results for input(s): AMMONIA in the last 8760 hours. CBC:  Recent Labs  06/16/16 0834 07/06/16 1456 07/18/16 1107  WBC 17.3* 17.4* 21.1*  NEUTROABS 12,802* 14,094* 18,146*  HGB 14.5 14.6 14.3  HCT 44.6 44.6 44.8  MCV 85.0 83.1 85.0  PLT 358 361 364   Lipid Panel:  Recent Labs  04/01/16 0900  CHOL 143  HDL 46  LDLCALC 80  TRIG 85  CHOLHDL 3.1   Lab Results  Component Value Date   HGBA1C 7.6 (H) 10/06/2016    Assessment/Plan 1. Chronic fatigue fibromyalgia syndrome -longstanding, has anxiety, depression and joint arthralgias and myalgias - oxyCODONE (OXY IR/ROXICODONE) 5 MG immediate release tablet; Take one tablet every 6 hours as needed for severe pain, Take 2 tablets at bedtime.  Dispense: 180 tablet; Refill: 0  2. Chronic low back pain, unspecified back pain laterality, with sciatica presence unspecified -uses hydrocodone when less severe or going out and oxycodone when worse - oxyCODONE (OXY IR/ROXICODONE) 5 MG immediate release tablet; Take one tablet every 6 hours as needed for severe pain, Take 2 tablets at bedtime.  Dispense: 180 tablet; Refill: 0  3. Chronic bilateral low back pain, with sciatica presence unspecified - cont oxy and norco as above - HYDROcodone-acetaminophen (NORCO) 10-325 MG tablet; Take one tablet every three hours as needed for pain  Dispense: 240 tablet; Refill: 0 -seems she also now has a pain in her left hip--recommended she discuss with rheum--?bursitis  4. Dysuria - also had frequency, urgency and some leakage, but has had some improvement with hydration--cont this, due to ongoing symptoms and her perception that her left great toe is infected again, will tx with cipro 500mg  po bid  x 7 days--if culture negative, will stop  - POC Urinalysis Dipstick  5.  Alopecia -ongoing, may be due in part to MTX, ? Any role of xeljanz in this, prior TSH was wnl but wants rechecked  Labs/tests ordered:  Add tsh to labs 5/10 to be done before visit with Cathey Next appt:  02/20/2017 med mgt   Jerrianne Hartin L. Siniyah Evangelist, D.O. [facility] [address] [place], [place] [address] Cell Phone (Mon-Fri 8am-5pm):  [phone] On Call:  [phone] & follow prompts after 5pm & weekends Office Phone:  [phone] Office Fax:  [phone]

## 2016-11-22 LAB — URINE CULTURE: Organism ID, Bacteria: NO GROWTH

## 2016-12-01 DIAGNOSIS — M05732 Rheumatoid arthritis with rheumatoid factor of left wrist without organ or systems involvement: Secondary | ICD-10-CM | POA: Diagnosis not present

## 2016-12-01 DIAGNOSIS — M05731 Rheumatoid arthritis with rheumatoid factor of right wrist without organ or systems involvement: Secondary | ICD-10-CM | POA: Diagnosis not present

## 2016-12-01 DIAGNOSIS — M79642 Pain in left hand: Secondary | ICD-10-CM | POA: Diagnosis not present

## 2016-12-12 ENCOUNTER — Other Ambulatory Visit: Payer: Self-pay

## 2016-12-12 DIAGNOSIS — G5602 Carpal tunnel syndrome, left upper limb: Secondary | ICD-10-CM | POA: Diagnosis not present

## 2016-12-12 DIAGNOSIS — M659 Synovitis and tenosynovitis, unspecified: Secondary | ICD-10-CM | POA: Diagnosis not present

## 2016-12-12 DIAGNOSIS — M05832 Other rheumatoid arthritis with rheumatoid factor of left wrist: Secondary | ICD-10-CM | POA: Diagnosis not present

## 2016-12-12 DIAGNOSIS — M6588 Other synovitis and tenosynovitis, other site: Secondary | ICD-10-CM | POA: Diagnosis not present

## 2016-12-12 DIAGNOSIS — M12232 Villonodular synovitis (pigmented), left wrist: Secondary | ICD-10-CM | POA: Diagnosis not present

## 2016-12-12 DIAGNOSIS — M24132 Other articular cartilage disorders, left wrist: Secondary | ICD-10-CM | POA: Diagnosis not present

## 2016-12-12 DIAGNOSIS — M25839 Other specified joint disorders, unspecified wrist: Secondary | ICD-10-CM | POA: Diagnosis not present

## 2016-12-13 ENCOUNTER — Encounter: Payer: Self-pay | Admitting: Cardiology

## 2016-12-15 DIAGNOSIS — M05731 Rheumatoid arthritis with rheumatoid factor of right wrist without organ or systems involvement: Secondary | ICD-10-CM | POA: Diagnosis not present

## 2016-12-15 DIAGNOSIS — M79632 Pain in left forearm: Secondary | ICD-10-CM | POA: Diagnosis not present

## 2016-12-15 DIAGNOSIS — M05732 Rheumatoid arthritis with rheumatoid factor of left wrist without organ or systems involvement: Secondary | ICD-10-CM | POA: Diagnosis not present

## 2016-12-15 DIAGNOSIS — M79642 Pain in left hand: Secondary | ICD-10-CM | POA: Diagnosis not present

## 2016-12-16 ENCOUNTER — Telehealth: Payer: Self-pay

## 2016-12-16 DIAGNOSIS — G9332 Myalgic encephalomyelitis/chronic fatigue syndrome: Secondary | ICD-10-CM

## 2016-12-16 DIAGNOSIS — R5382 Chronic fatigue, unspecified: Principal | ICD-10-CM

## 2016-12-16 DIAGNOSIS — G8929 Other chronic pain: Secondary | ICD-10-CM

## 2016-12-16 DIAGNOSIS — M545 Low back pain: Secondary | ICD-10-CM

## 2016-12-16 DIAGNOSIS — M797 Fibromyalgia: Secondary | ICD-10-CM

## 2016-12-16 MED ORDER — OXYCODONE HCL 5 MG PO TABS: ORAL_TABLET | ORAL | 0 refills | Status: DC

## 2016-12-16 MED ORDER — HYDROCODONE-ACETAMINOPHEN 10-325 MG PO TABS: ORAL_TABLET | ORAL | 0 refills | Status: DC

## 2016-12-16 NOTE — Telephone Encounter (Signed)
Ok to fill Rx early due to recent surgery.  She should bring the Rx from Dr. Burney Gauze when she comes to get her Rx from [place].

## 2016-12-16 NOTE — Telephone Encounter (Signed)
BD'H printed, patient aware. Patient states her son will pick up rx's.   RX written by Dr.Weingold was shredded by [employer].   I called pharmacy, spoke with Yvone Neu. RX was given to them from Dr.Weingold and placed in a do not fill folder.  I informed pharmacy rx will be provided by Dr.Reed today and Dr.Reed is ok with it being filled early, note make on rx

## 2016-12-16 NOTE — Telephone Encounter (Signed)
Patient had left had surgery for cyst, tendon repair and carpel tunnel release. Patient was given a rx from [place] [employer] account executive) for percocet #20 and she did not get it filled due to contract on file with [place].  Patient is in a lot of pain and will run out of pain medication today. Patient would like to know if Dr.Reed will release her pain medication early, due on 12/21/16  Please advise

## 2016-12-18 ENCOUNTER — Other Ambulatory Visit: Payer: Self-pay | Admitting: Internal Medicine

## 2016-12-26 ENCOUNTER — Telehealth: Payer: Self-pay | Admitting: Rheumatology

## 2016-12-26 ENCOUNTER — Telehealth: Payer: Self-pay | Admitting: Radiology

## 2016-12-26 NOTE — Telephone Encounter (Signed)
Patient has called in with increased pain past 2 weeks, she is on MTX/ Morrie Sheldon for her RA (she also has FMS)  April and Marissa are finding a spot to work her in (probably on Thursday) can we offer anything else for her? PCP has seen her and has given her pain meds and told her hip pain likely bursitis  Please advise

## 2016-12-26 NOTE — Telephone Encounter (Signed)
We need to work her in, what do we have available for a work in?  ?tomorrow at 11:30 may be best.

## 2016-12-26 NOTE — Telephone Encounter (Signed)
Prednisone taper 5mg  tab, 3x4d, 2x4d, 1x4d, 1/2x4d. Please discuss a.m. dose, side effect which include elevation of blood pressure and blood glucose.

## 2016-12-26 NOTE — Telephone Encounter (Signed)
Patient is scheduled for a rov on June 6th . Patient is having extreme pain in Lt knee down leg, also in Rt hip down leg. Patient wants to be worked in if possible to discuss extreme pain x2 weeks. Please call patient to advise.

## 2016-12-27 MED ORDER — PREDNISONE 5 MG PO TABS: ORAL_TABLET | ORAL | 0 refills | Status: DC

## 2016-12-27 NOTE — Progress Notes (Signed)
Office Visit Note  Patient: Katherine Delgado             Date of Birth: 21-Jun-1941           MRN: [id number]             PCP: Gayland Curry, DO Referring: Gayland Curry, DO Visit Date: 12/29/2016 Occupation: @GUAROCC @    Subjective:     History of Present Illness: Katherine Delgado is a 76 y.o. female with history of rheumatoid arthritis and osteoarthritis. She's been struggling with the left great toe foot ulcer for the last 5-6 months. She states she's been having recurrent infection and that. Her last infection was about 3 months ago for which she had good [facility] she seen several physicians since then. She reports seeing Dr. Sharol Given, her podiatrist and her PCP. The last course of antibiotics was a month ago. She states the ulcer is gradually healing currently. She was off methotrexate during the time she had ulcer on her foot. Which she continued [place]. She had left wrist joint extensor tenosynovectomy and tendon repair by Dr. Burney Gauze on April 23. She had his stitches out 2 days ago. After 3 weeks she will start physical therapy. She's been off [place] since mid April. She's been having discomfort in her right hip. She also has pain in her bilateral knee joints with swelling in her left knee joint. She started prednisone taper yesterday.  Activities of Daily Living:  Patient reports morning stiffness for all day  hours.   Patient Reports nocturnal pain.  Difficulty dressing/grooming: Denies Difficulty climbing stairs: Reports Difficulty getting out of chair: Reports Difficulty using hands for taps, buttons, cutlery, and/or writing: Reports   Review of Systems  Constitutional: Positive for fatigue and weakness. Negative for night sweats, weight gain and weight loss.  HENT: Positive for mouth dryness. Negative for mouth sores and nose dryness.   Eyes: Positive for dryness. Negative for pain, redness and visual disturbance.  Respiratory: Negative for cough, shortness of  breath and difficulty breathing.   Cardiovascular: Negative for chest pain, palpitations, hypertension, irregular heartbeat and swelling in legs/feet.  Gastrointestinal: Negative for blood in stool, constipation and diarrhea.  Endocrine: Negative for increased urination.  Genitourinary: Negative for painful urination and vaginal dryness.  Musculoskeletal: Positive for arthralgias, joint pain, joint swelling, myalgias, morning stiffness and myalgias.  Skin: Negative for color change, rash, redness, skin tightness, ulcers and sensitivity to sunlight.  Allergic/Immunologic: Negative for susceptible to infections.  Neurological: Negative for dizziness, headaches and night sweats.  Hematological: Negative for swollen glands.  Psychiatric/Behavioral: Positive for sleep disturbance. Negative for depressed mood. The patient is not nervous/anxious.     PMFS History:  Patient Active Problem List   Diagnosis Date Noted  . Ulcer of toe of left foot, with necrosis of bone ([place]) 07/25/2016  . Rheumatoid arthritis with positive rheumatoid factor ([place]) 06/14/2016  . High risk medication use 06/14/2016  . Sjogren's syndrome ([place]) 06/14/2016  . Primary osteoarthritis of both knees 06/14/2016  . Osteoarthritis of lumbar spine 06/14/2016  . Hypothyroidism 06/14/2016  . Osteoporosis 06/14/2016  . Abnormality of gait 04/19/2016  . Type 2 diabetes mellitus with diabetic polyneuropathy, with long-term current use of insulin ([place]) 11/27/2015  . Severe obesity (BMI >= 40) ([place]) 04/21/2014  . Type II or unspecified type diabetes mellitus with peripheral circulatory disorders, uncontrolled(250.72) 09/12/2013  . Osteopenia, senile   . Headache(784.0) 07/31/2013  . Sinus infection 07/31/2013  . Chronic low  back pain 03/14/2013  . Insomnia 12/06/2012  . Nausea alone 12/06/2012  . Diarrhea 12/06/2012  . Urinary incontinence, urge 12/06/2012  . Lumbosacral root lesions, not elsewhere classified 11/27/2012  .  Diabetic polyneuropathy associated with type 2 diabetes mellitus ([place]) 11/27/2012  . EDEMA 05/04/2010  . YEAST INFECTION 05/03/2010  . DIABETES MELLITUS 05/03/2010  . Hyperlipidemia 05/03/2010  . DEPRESSION 05/03/2010  . History of peripheral neuropathy 05/03/2010  . GUILLAIN-BARRE SYNDROME 05/03/2010  . Essential hypertension 05/03/2010  . ALLERGIC RHINITIS 05/03/2010  . PNEUMONIA 05/03/2010  . COPD 05/03/2010  . GERD 05/03/2010  . Fibromyalgia 05/03/2010  . DYSPNEA 05/03/2010  . CHEST PAIN 05/03/2010    Past Medical History:  Diagnosis Date  . Abnormal weight gain   . Abnormality of gait 04/19/2016  . Acute infective polyneuritis ([place]) 2002  . Acute maxillary sinusitis   . Acute sinusitis, unspecified   . Allergic rhinitis due to pollen   . Arthritis   . Back injury   . Candidiasis of skin and nails   . Candidiasis of vulva and vagina   . Chronic pain syndrome   . COPD (chronic obstructive pulmonary disease) ([place])   . Cough   . Degenerative arthritis   . Depressive disorder, not elsewhere classified   . Diabetes mellitus without complication ([place])   . Diaphragmatic hernia without mention of obstruction or gangrene   . Dyslipidemia   . Edema   . Fibromyalgia   . GERD (gastroesophageal reflux disease)   . Guillain-Barre syndrome ([place])   . History of benign thymus tumor   . Hypertension   . Insomnia, unspecified   . Lumbago   . Miscarriage 1962  . Mixed hyperlipidemia   . Morbid obesity ([place])   . Obstructive chronic bronchitis with acute bronchitis ([place])   . Osteopenia, senile   . Osteoporosis   . Other malaise and fatigue   . Other specified disease of white blood cells   . Pain in joint, multiple sites   . Polyneuropathy in diabetes(357.2)   . RA (rheumatoid arthritis) ([place])   . Reflux esophagitis   . Rheumatoid arthritis(714.0)   . Shortness of breath   . Spinal stenosis, lumbar region, without neurogenic claudication   . Spondylosis, lumbosacral   .  Spontaneous ecchymoses   . Stiffness of joints, not elsewhere classified, multiple sites   . Tear film insufficiency, unspecified   . Thyroid disease   . Thyroid disorder   . Type II or unspecified type diabetes mellitus with peripheral circulatory disorders, uncontrolled(250.72)   . Unspecified chronic bronchitis ([place])   . Unspecified essential hypertension   . Unspecified hypothyroidism   . Unspecified pruritic disorder   . Unspecified urinary incontinence   . Urinary tract infection, site not specified     Family History  Problem Relation Age of Onset  . Alzheimer's disease Mother   . Heart disease Mother   . Heart disease Father   . Liver disease Father   . Breast cancer Sister   . Arrhythmia Sister   . Cancer Brother   . Colon polyps Brother   . Colon cancer Neg Hx   . Esophageal cancer Neg Hx   . Kidney disease Neg Hx   . Stomach cancer Neg Hx   . Rectal cancer Neg Hx    Past Surgical History:  Procedure Laterality Date  . ABDOMINAL HYSTERECTOMY  1974  . abdominal tumor  2002  . APPENDECTOMY    . [place]  .  BRONCHOSCOPY  2001  . CHOLECYSTECTOMY  1984  . KNEE SURGERY Bilateral 08/27/2010 (L) and 01/11/2011 (R)  . [place]  . OVARIAN CYST SURGERY  1968  . thymus tumor    . 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 TONSILLECTOMY     Social History   Social History Narrative   Walks with cane     Objective: Vital Signs: BP 138/70   Pulse 74   Resp 14    Physical Exam  Constitutional: She is oriented to person, place, and time. She appears well-developed and well-nourished.  HENT:  Head: Normocephalic and atraumatic.  Eyes: Conjunctivae and EOM are normal.  Neck: Normal range of motion.  Cardiovascular: Normal rate, regular rhythm, normal heart sounds and intact distal pulses.   Pulmonary/Chest: [place] normal and breath sounds normal.  Abdominal: Soft. Bowel sounds are normal.  Lymphadenopathy:    She has no cervical adenopathy.  Neurological: She  is alert and oriented to person, place, and time.  Skin: Skin is warm and dry. Capillary refill takes less than 2 seconds.  Psychiatric: She has a normal mood and affect. Her behavior is normal.  Nursing note and vitals reviewed.    Musculoskeletal Exam: C-spine good range of motion thoracic and lumbar spine was difficult to evaluate as she was in the wheelchair. Shoulder joints are good range of motion. Her left forearm was in a cast. Right elbow joint is in good range of motion. She has ulnar deviation of her MCP joints with no synovitis. Right hip joint had limited painful range of motion. She is warmth in bilateral knee joints with swelling in her left knee joint. She had bilateral ankle joint edema and swelling. Her left great toe this dressed due to foot ulcer.  CDAI Exam: CDAI Homunculus Exam:   Tenderness:  RLE: acetabulofemoral, tibiofemoral and tibiotalar LLE: tibiofemoral and tibiotalar  Swelling:  LLE: tibiofemoral  Joint Counts:  CDAI Tender Joint count: 2 CDAI Swollen Joint count: 1  Global Assessments:  Patient Global Assessment: 8 Provider Global Assessment: 8  CDAI Calculated Score: 19    Investigation: No additional findings.  CBC Latest Ref Rng & Units 07/18/2016 07/06/2016 06/16/2016  WBC 3.8 - 10.8 K/uL 21.1(H) 17.4(H) 17.3(H)  Hemoglobin 11.7 - 15.5 g/dL 14.3 14.6 14.5  Hematocrit 35.0 - 45.0 % 44.8 44.6 44.6  Platelets 140 - 400 K/uL 364 361 358    CMP Latest Ref Rng & Units 10/06/2016 10/03/2016 09/22/2016  Glucose 65 - 99 mg/dL 178(H) 172(H) 189(H)  BUN 7 - 25 mg/dL 17 23 14   Creatinine 0.60 - 0.93 mg/dL 0.80 1.00(H) 0.93  Sodium 135 - 146 mmol/L 139 138 141  Potassium 3.5 - 5.3 mmol/L 4.5 4.3 3.9  Chloride 98 - 110 mmol/L 101 100 103  CO2 20 - 31 mmol/L 27 26 24   Calcium 8.6 - 10.4 mg/dL 9.4 9.7 9.5  Total Protein 6.1 - 8.1 g/dL 6.7 - -  Total Bilirubin 0.2 - 1.2 mg/dL 0.4 - -  Alkaline Phos 33 - 130 U/L 100 - -  AST 10 - 35 U/L 37(H) - -    ALT 6 - 29 U/L 33(H) - -    Imaging: Xr Hip Unilat W Or W/o Pelvis 2-3 Views Right  Result Date: 12/29/2016 Mild inferior medial narrowing. These findings were consistent with mild osteoarthritis of the hip joint  Xr Knee 3 View Left  Result Date: 12/29/2016 Moderate to severe medial compartment narrowing. Severe patellofemoral narrowing.  Xr Knee 3  View Right  Result Date: 12/29/2016 Severe bilateral compartmental narrowing and severe patellofemoral narrowing consistent with rheumatoid arthritis   Speciality Comments: No specialty comments available.    Procedures:  Large Joint Inj Date/Time: 12/29/2016 10:46 AM Performed by: Bo Merino Authorized by: Bo Merino   Consent Given by:  Patient Site marked: the procedure site was marked   Timeout: prior to procedure the correct patient, procedure, and site was verified   Indications:  Pain and joint swelling Location:  Knee Site:  L knee Prep: patient was prepped and draped in usual sterile fashion   Needle Size:  27 G Needle Length:  1.5 inches Approach:  Medial Ultrasound Guidance: No   Fluoroscopic Guidance: No   Arthrogram: No   Medications:  1.5 mL lidocaine 1 %; 40 mg triamcinolone acetonide 40 MG/ML Aspiration Attempted: Yes   Patient tolerance:  Patient tolerated the procedure well with no immediate complications   Allergies: Penicillins and Sulfamethoxazole-trimethoprim   Assessment / Plan:     Visit Diagnoses: Rheumatoid arthritis involving multiple sites with positive rheumatoid factor (HCC) - Positive RF, positive anti-CCP. She is having a flare being off his old [place] methotrexate. She's been off Morrie Sheldon for a month almost due to left hand surgery. She's been off methotrexate for a while due to recurrent infection in her foot ulcer.  High risk medication use - (Xeljanz 5 mg by mouth twice a day, MTX 0.6 ml sq qwk-on hold), folic acid 1mg  po qd, Prednisone taper currently  Left great toe  ulcer: She will need clearance from her podiatrist or Dr. Sharol Given prior to resuming methotrexate and Morrie Sheldon.  Status post left tenosynovectomy and tendon repair by Dr. Burney Gauze. Patient reports it's been healing well.  Primary osteoarthritis of both knees: She's been having increased pain and discomfort in her knee joints with swelling in her left knee joint. She has difficulty with mobility. X-ray of bilateral knee joints to 2 views were obtained today. X-ray showed end-stage arthritis with severe compartmental narrowing. She also has chondromalacia patella. Her left knee joint was swollen today and had a lot of discomfort. After informed consent was obtained the left knee joint was injected with cortisone the procedures described above.  Right hip joint severe pain and discomfort: X-ray of right hip joint 2 views was obtained today. She has mild narrowing in the joint. I believe some of the pain is coming from her lower back.  Other osteoarthritis of spine, lumbar region: Chronic pain she has very limited mobility and mostly in the wheelchair.  Age-related osteoporosis without current pathological fracture  Fibromyalgia: The causes generalized pain and discomfort.  History of peripheral neuropathy  History of diabetes mellitus  History of Guillain-Barre syndrome  Severe obesity (BMI >= 40) (HCC)  Other insomnia  History of hypertension  History of COPD  History of gastroesophageal reflux (GERD)  History of depression    Orders: Orders Placed This Encounter  Procedures  . Large Joint Injection/Arthrocentesis  . XR HIP UNILAT W OR W/O PELVIS 2-3 VIEWS RIGHT  . XR KNEE 3 VIEW RIGHT  . XR KNEE 3 VIEW LEFT  . CBC with Differential/Platelet  . COMPLETE METABOLIC PANEL WITH GFR  . CBC with Differential/Platelet  . COMPLETE METABOLIC PANEL WITH GFR  . Quantiferon tb gold assay (blood)   No orders of the defined types were placed in this encounter.   Face-to-face time spent  with patient was 40 minutes. 50% of time was spent in counseling and coordination of care.  Follow-Up Instructions: Return in about 1 month (around 01/29/2017) for Rheumatoid arthritis.   Bo Merino, MD  Note - This record has been created using Editor, commissioning.  Chart creation errors have been sought, but may not always  have been located. Such creation errors do not reflect on  the standard of medical care.

## 2016-12-27 NOTE — Telephone Encounter (Signed)
Prescription sent to pharmacy. Attempted to contact patient and left message for patient to call the office.

## 2016-12-28 NOTE — Telephone Encounter (Signed)
Patient advised of prescription being sent to the pharmacy. Patient advised prescription is to be taken in the morning and discussed side effects with patient. Patient verbalized understanding.

## 2016-12-29 ENCOUNTER — Ambulatory Visit (INDEPENDENT_AMBULATORY_CARE_PROVIDER_SITE_OTHER): Payer: Medicare Other | Admitting: Rheumatology

## 2016-12-29 ENCOUNTER — Telehealth: Payer: Self-pay | Admitting: Rheumatology

## 2016-12-29 ENCOUNTER — Ambulatory Visit (INDEPENDENT_AMBULATORY_CARE_PROVIDER_SITE_OTHER): Payer: Medicare Other

## 2016-12-29 ENCOUNTER — Other Ambulatory Visit: Payer: Medicare Other

## 2016-12-29 VITALS — BP 138/70 | HR 74 | Resp 14

## 2016-12-29 DIAGNOSIS — Z8639 Personal history of other endocrine, nutritional and metabolic disease: Secondary | ICD-10-CM

## 2016-12-29 DIAGNOSIS — M170 Bilateral primary osteoarthritis of knee: Secondary | ICD-10-CM

## 2016-12-29 DIAGNOSIS — M797 Fibromyalgia: Secondary | ICD-10-CM

## 2016-12-29 DIAGNOSIS — Z8719 Personal history of other diseases of the digestive system: Secondary | ICD-10-CM

## 2016-12-29 DIAGNOSIS — M0579 Rheumatoid arthritis with rheumatoid factor of multiple sites without organ or systems involvement: Secondary | ICD-10-CM

## 2016-12-29 DIAGNOSIS — M810 Age-related osteoporosis without current pathological fracture: Secondary | ICD-10-CM | POA: Diagnosis not present

## 2016-12-29 DIAGNOSIS — Z8679 Personal history of other diseases of the circulatory system: Secondary | ICD-10-CM

## 2016-12-29 DIAGNOSIS — Z8669 Personal history of other diseases of the nervous system and sense organs: Secondary | ICD-10-CM | POA: Diagnosis not present

## 2016-12-29 DIAGNOSIS — M47896 Other spondylosis, lumbar region: Secondary | ICD-10-CM | POA: Diagnosis not present

## 2016-12-29 DIAGNOSIS — M25551 Pain in right hip: Secondary | ICD-10-CM | POA: Diagnosis not present

## 2016-12-29 DIAGNOSIS — G4709 Other insomnia: Secondary | ICD-10-CM | POA: Diagnosis not present

## 2016-12-29 DIAGNOSIS — Z8659 Personal history of other mental and behavioral disorders: Secondary | ICD-10-CM

## 2016-12-29 DIAGNOSIS — Z79899 Other long term (current) drug therapy: Secondary | ICD-10-CM

## 2016-12-29 DIAGNOSIS — Z8709 Personal history of other diseases of the respiratory system: Secondary | ICD-10-CM | POA: Diagnosis not present

## 2016-12-29 LAB — CBC WITH DIFFERENTIAL/PLATELET
Basophils Absolute: 120 cells/uL (ref 0–200)
Basophils Relative: 1 %
Eosinophils Absolute: 360 cells/uL (ref 15–500)
Eosinophils Relative: 3 %
HEMATOCRIT: 41.5 % (ref 35.0–45.0)
Hemoglobin: 13.5 g/dL (ref 11.7–15.5)
LYMPHS PCT: 15 %
Lymphs Abs: 1800 cells/uL (ref 850–3900)
MCH: 27.5 pg (ref 27.0–33.0)
MCHC: 32.5 g/dL (ref 32.0–36.0)
MCV: 84.5 fL (ref 80.0–100.0)
MONO ABS: 960 {cells}/uL — AB (ref 200–950)
MPV: 10.4 fL (ref 7.5–12.5)
Monocytes Relative: 8 %
NEUTROS PCT: 73 %
Neutro Abs: 8760 cells/uL — ABNORMAL HIGH (ref 1500–7800)
Platelets: 308 10*3/uL (ref 140–400)
RBC: 4.91 MIL/uL (ref 3.80–5.10)
RDW: 15 % (ref 11.0–15.0)
WBC: 12 10*3/uL — AB (ref 3.8–10.8)

## 2016-12-29 MED ORDER — TRIAMCINOLONE ACETONIDE 40 MG/ML IJ SUSP
40.0000 mg | INTRAMUSCULAR | Status: AC | PRN
  Administered 2016-12-29: 40 mg via INTRA_ARTICULAR

## 2016-12-29 MED ORDER — LIDOCAINE HCL 1 % IJ SOLN
1.5000 mL | INTRAMUSCULAR | Status: AC | PRN
  Administered 2016-12-29: 1.5 mL

## 2016-12-29 NOTE — Telephone Encounter (Signed)
Patient would like to start process for Hyalgan injections.

## 2016-12-30 ENCOUNTER — Institutional Professional Consult (permissible substitution): Payer: Medicare Other | Admitting: Pulmonary Disease

## 2016-12-30 LAB — COMPLETE METABOLIC PANEL WITH GFR
ALT: 26 U/L (ref 6–29)
AST: 44 U/L — ABNORMAL HIGH (ref 10–35)
Albumin: 3.8 g/dL (ref 3.6–5.1)
Alkaline Phosphatase: 89 U/L (ref 33–130)
BUN: 18 mg/dL (ref 7–25)
CALCIUM: 9.5 mg/dL (ref 8.6–10.4)
CHLORIDE: 100 mmol/L (ref 98–110)
CO2: 24 mmol/L (ref 20–31)
Creat: 0.93 mg/dL (ref 0.60–0.93)
GFR, Est African American: 69 mL/min (ref >=60)
GFR, Est Non African American: 60 mL/min (ref >=60)
Glucose, Bld: 188 mg/dL — ABNORMAL HIGH (ref 65–99)
POTASSIUM: 4.2 mmol/L (ref 3.5–5.3)
Sodium: 139 mmol/L (ref 135–146)
Total Bilirubin: 0.4 mg/dL (ref 0.2–1.2)
Total Protein: 6.6 g/dL (ref 6.1–8.1)

## 2016-12-30 NOTE — Progress Notes (Signed)
Labs are stable. She will resume meds are clearence from Dr. Sharol Given

## 2017-01-02 ENCOUNTER — Ambulatory Visit: Payer: Medicare Other | Admitting: Pharmacotherapy

## 2017-01-02 ENCOUNTER — Ambulatory Visit: Payer: Medicare Other | Admitting: Cardiology

## 2017-01-03 ENCOUNTER — Ambulatory Visit: Payer: Medicare Other | Admitting: Rheumatology

## 2017-01-03 ENCOUNTER — Other Ambulatory Visit: Payer: Self-pay | Admitting: Internal Medicine

## 2017-01-03 NOTE — Telephone Encounter (Signed)
Please check. Thank you. 

## 2017-01-12 NOTE — Telephone Encounter (Signed)
Okay to apply for Hyalgan. Her bilateral knee joints. She received her Visco supplement injections in May 2015.

## 2017-01-12 NOTE — Telephone Encounter (Signed)
I don't see an actual message approving ok to apply for Hyalgan, is this ok to do?

## 2017-01-13 ENCOUNTER — Other Ambulatory Visit: Payer: Self-pay | Admitting: *Deleted

## 2017-01-13 DIAGNOSIS — G9332 Myalgic encephalomyelitis/chronic fatigue syndrome: Secondary | ICD-10-CM

## 2017-01-13 DIAGNOSIS — G8929 Other chronic pain: Secondary | ICD-10-CM

## 2017-01-13 DIAGNOSIS — M797 Fibromyalgia: Secondary | ICD-10-CM

## 2017-01-13 DIAGNOSIS — M545 Low back pain: Principal | ICD-10-CM

## 2017-01-13 DIAGNOSIS — R5382 Chronic fatigue, unspecified: Secondary | ICD-10-CM

## 2017-01-13 MED ORDER — OXYCODONE HCL 5 MG PO TABS: ORAL_TABLET | ORAL | 0 refills | Status: DC

## 2017-01-13 MED ORDER — HYDROCODONE-ACETAMINOPHEN 10-325 MG PO TABS: ORAL_TABLET | ORAL | 0 refills | Status: DC

## 2017-01-13 NOTE — Telephone Encounter (Signed)
Patient requested and will pick up 

## 2017-01-16 ENCOUNTER — Other Ambulatory Visit: Payer: Self-pay | Admitting: Internal Medicine

## 2017-01-17 NOTE — Telephone Encounter (Signed)
Please advise if medication to be taken routinely. Patient is getting rx filled every 30 days. If medication is routine can we provide refills

## 2017-01-17 NOTE — Telephone Encounter (Signed)
Pt should not be taking anti-nausea medication daily.  This is for short-term use only.  If another medication is causing her to be nauseous, it needs to be changed.

## 2017-01-17 NOTE — Telephone Encounter (Signed)
Spoke with patient, patient states that she did not request this and she is taking as needed. Patient still has pills from last refill.

## 2017-01-26 ENCOUNTER — Encounter: Payer: Self-pay | Admitting: Rheumatology

## 2017-01-26 ENCOUNTER — Ambulatory Visit (INDEPENDENT_AMBULATORY_CARE_PROVIDER_SITE_OTHER): Payer: Medicare Other | Admitting: Rheumatology

## 2017-01-26 VITALS — BP 158/70 | HR 88 | Resp 16 | Wt 276.0 lb

## 2017-01-26 DIAGNOSIS — M797 Fibromyalgia: Secondary | ICD-10-CM

## 2017-01-26 DIAGNOSIS — M0579 Rheumatoid arthritis with rheumatoid factor of multiple sites without organ or systems involvement: Secondary | ICD-10-CM | POA: Diagnosis not present

## 2017-01-26 DIAGNOSIS — Z79899 Other long term (current) drug therapy: Secondary | ICD-10-CM

## 2017-01-26 DIAGNOSIS — M170 Bilateral primary osteoarthritis of knee: Secondary | ICD-10-CM

## 2017-01-26 NOTE — Progress Notes (Signed)
Office Visit Note  Patient: Katherine Delgado             Date of Birth: 1940-08-24           MRN: [id number]             PCP: Gayland Curry, DO Referring: Gayland Curry, DO Visit Date: 01/26/2017 Occupation: _0 @    Subjective:  Medication Management (has not started the MTX yet)   History of Present Illness: Katherine Delgado is a 76 y.o. female  LAST Seen may 2018 in our office. Patient has a history of rheumatoid arthritis and osteoarthritis and FMS.  Because of her ulcers to her left great toe for about 6-7 months, she came off of Xeljanz and methotrexate. She has been treated for this ulcer and it has not healed. In addition, she also had tenosynovitis to the left wrist. She saw Dr. Burney Gauze and has had surgery for this.  It is healing well. While she was off of the Xeljanz and methotrexate, her RA flared and Dr. Estanislado Pandy gave her prednisone taper at the last visit.  The prednisone taper did help her some but did not take all of her pain away.  Please see last note for full details.  Now, patient has restarted the Morrie Sheldon soon after the last visit on 12/29/2016. She recalls that she may have started [place] about Jan 03, 2017. She has been taking xeljanz 5 mg twice a day. Patient reports that the surgery by Dr. Burney Gauze for her left wrist tenosynovitis, and Dr. Sharol Given for the ulcer in her left great toe are all healing well. However, she continues to be off of mtx for last 2-3 months.  Currently she rates her overall discomfort as a 7 on a scale of 0-10. (This includes fibromyalgia, RA, OA). If possible, she thinks that the rheumatoid arthritis portion of that is about a 5 on a scale of 0-10. She admits that it's hard to separate out all the different pains     Activities of Daily Living:  Patient reports morning stiffness for 30 minutes.   Patient Reports nocturnal pain.  Difficulty dressing/grooming: Reports Difficulty climbing stairs: Reports Difficulty getting out  of chair: Reports Difficulty using hands for taps, buttons, cutlery, and/or writing: Reports   Review of Systems  Constitutional: Positive for fatigue.  HENT: Negative for mouth sores and mouth dryness.   Eyes: Negative for dryness.  Respiratory: Negative for shortness of breath.   Gastrointestinal: Negative for constipation and diarrhea.  Musculoskeletal: Positive for myalgias and myalgias.  Skin: Negative for sensitivity to sunlight.  Psychiatric/Behavioral: Positive for sleep disturbance. Negative for decreased concentration.    PMFS History:  Patient Active Problem List   Diagnosis Date Noted  . Ulcer of toe of left foot, with necrosis of bone ([place]) 07/25/2016  . Rheumatoid arthritis with positive rheumatoid factor ([place]) 06/14/2016  . High risk medication use 06/14/2016  . Sjogren's syndrome ([place]) 06/14/2016  . Primary osteoarthritis of both knees 06/14/2016  . Osteoarthritis of lumbar spine 06/14/2016  . Hypothyroidism 06/14/2016  . Osteoporosis 06/14/2016  . Abnormality of gait 04/19/2016  . Type 2 diabetes mellitus with diabetic polyneuropathy, with long-term current use of insulin ([place]) 11/27/2015  . Severe obesity (BMI >= 40) ([place]) 04/21/2014  . Type II or unspecified type diabetes mellitus with peripheral circulatory disorders, uncontrolled(250.72) 09/12/2013  . Osteopenia, senile   . Headache(784.0) 07/31/2013  . Sinus infection 07/31/2013  . Chronic low back pain 03/14/2013  .  Insomnia 12/06/2012  . Nausea alone 12/06/2012  . Diarrhea 12/06/2012  . Urinary incontinence, urge 12/06/2012  . Lumbosacral root lesions, not elsewhere classified 11/27/2012  . Diabetic polyneuropathy associated with type 2 diabetes mellitus ([place]) 11/27/2012  . EDEMA 05/04/2010  . YEAST INFECTION 05/03/2010  . DIABETES MELLITUS 05/03/2010  . Hyperlipidemia 05/03/2010  . DEPRESSION 05/03/2010  . History of peripheral neuropathy 05/03/2010  . GUILLAIN-BARRE SYNDROME 05/03/2010  .  Essential hypertension 05/03/2010  . ALLERGIC RHINITIS 05/03/2010  . PNEUMONIA 05/03/2010  . COPD 05/03/2010  . GERD 05/03/2010  . Fibromyalgia 05/03/2010  . DYSPNEA 05/03/2010  . CHEST PAIN 05/03/2010    Past Medical History:  Diagnosis Date  . Abnormal weight gain   . Abnormality of gait 04/19/2016  . Acute infective polyneuritis ([place]) 2002  . Acute maxillary sinusitis   . Acute sinusitis, unspecified   . Allergic rhinitis due to pollen   . Arthritis   . Back injury   . Candidiasis of skin and nails   . Candidiasis of vulva and vagina   . Chronic pain syndrome   . COPD (chronic obstructive pulmonary disease) ([place])   . Cough   . Degenerative arthritis   . Depressive disorder, not elsewhere classified   . Diabetes mellitus without complication ([place])   . Diaphragmatic hernia without mention of obstruction or gangrene   . Dyslipidemia   . Edema   . Fibromyalgia   . GERD (gastroesophageal reflux disease)   . Guillain-Barre syndrome ([place])   . History of benign thymus tumor   . Hypertension   . Insomnia, unspecified   . Lumbago   . Miscarriage 1962  . Mixed hyperlipidemia   . Morbid obesity ([place])   . Obstructive chronic bronchitis with acute bronchitis ([place])   . Osteopenia, senile   . Osteoporosis   . Other malaise and fatigue   . Other specified disease of white blood cells   . Pain in joint, multiple sites   . Polyneuropathy in diabetes(357.2)   . RA (rheumatoid arthritis) ([place])   . Reflux esophagitis   . Rheumatoid arthritis(714.0)   . Shortness of breath   . Spinal stenosis, lumbar region, without neurogenic claudication   . Spondylosis, lumbosacral   . Spontaneous ecchymoses   . Stiffness of joints, not elsewhere classified, multiple sites   . Tear film insufficiency, unspecified   . Thyroid disease   . Thyroid disorder   . Type II or unspecified type diabetes mellitus with peripheral circulatory disorders, uncontrolled(250.72)   . Unspecified chronic  bronchitis ([place])   . Unspecified essential hypertension   . Unspecified hypothyroidism   . Unspecified pruritic disorder   . Unspecified urinary incontinence   . Urinary tract infection, site not specified     Family History  Problem Relation Age of Onset  . Alzheimer's disease Mother   . Heart disease Mother   . Heart disease Father   . Liver disease Father   . Breast cancer Sister   . Arrhythmia Sister   . Cancer Brother   . Colon polyps Brother   . Colon cancer Neg Hx   . Esophageal cancer Neg Hx   . Kidney disease Neg Hx   . Stomach cancer Neg Hx   . Rectal cancer Neg Hx    Past Surgical History:  Procedure Laterality Date  . ABDOMINAL HYSTERECTOMY  1974  . abdominal tumor  2002  . APPENDECTOMY    . [place]  . BRONCHOSCOPY  2001  .  CHOLECYSTECTOMY  1984  . KNEE SURGERY Bilateral 08/27/2010 (L) and 01/11/2011 (R)  . [place]  . OVARIAN CYST SURGERY  1968  . thymus tumor    . 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 TONSILLECTOMY     Social History   Social History Narrative   Walks with cane     Objective: Vital Signs: BP (!) 158/70   Pulse 88   Resp 16   Wt 276 lb (125.2 kg)   BMI 44.55 kg/m    Physical Exam  Constitutional: She is oriented to person, place, and time. She appears well-developed and well-nourished.  HENT:  Head: Normocephalic and atraumatic.  Eyes: EOM are normal. Pupils are equal, round, and reactive to light.  Cardiovascular: Normal rate, regular rhythm and normal heart sounds.  Exam reveals no gallop and no friction rub.   No murmur heard. Pulmonary/Chest: Effort normal and breath sounds normal. She has no wheezes. She has no rales.  Abdominal: Soft. Bowel sounds are normal. She exhibits no distension. There is no tenderness. There is no guarding. No hernia.  Musculoskeletal: Normal range of motion. She exhibits no edema, tenderness or deformity.  Lymphadenopathy:    She has no cervical adenopathy.  Neurological: She is alert  and oriented to person, place, and time. Coordination normal.  Skin: Skin is warm and dry. Capillary refill takes less than 2 seconds. No rash noted.  Psychiatric: She has a normal mood and affect. Her behavior is normal.  Nursing note and vitals reviewed.    Musculoskeletal Exam:  Full range of motion of all joints except she is worrying a brace on her left forearm secondary to her left wrist tenosynovitis surgery. Grip strength is equal and strong bilaterally Fibromyalgia tender points are 18 out of 18 positive  CDAI Exam: No CDAI exam completed.  No synovitis on examination  Investigation: No additional findings. Office Visit on 12/29/2016  Component Date Value Ref Range Status  . WBC 12/29/2016 12.0* 3.8 - 10.8 K/uL Final  . RBC 12/29/2016 4.91  3.80 - 5.10 MIL/uL Final  . Hemoglobin 12/29/2016 13.5  11.7 - 15.5 g/dL Final  . HCT 12/29/2016 41.5  35.0 - 45.0 % Final  . MCV 12/29/2016 84.5  80.0 - 100.0 fL Final  . MCH 12/29/2016 27.5  27.0 - 33.0 pg Final  . MCHC 12/29/2016 32.5  32.0 - 36.0 g/dL Final  . RDW 12/29/2016 15.0  11.0 - 15.0 % Final  . Platelets 12/29/2016 308  140 - 400 K/uL Final  . MPV 12/29/2016 10.4  7.5 - 12.5 fL Final  . Neutro Abs 12/29/2016 8760* 1,500 - 7,800 cells/uL Final  . Lymphs Abs 12/29/2016 1800  850 - 3,900 cells/uL Final  . Monocytes Absolute 12/29/2016 960* 200 - 950 cells/uL Final  . Eosinophils Absolute 12/29/2016 360  15 - 500 cells/uL Final  . Basophils Absolute 12/29/2016 120  0 - 200 cells/uL Final  . Neutrophils Relative % 12/29/2016 73  % Final  . Lymphocytes Relative 12/29/2016 15  % Final  . Monocytes Relative 12/29/2016 8  % Final  . Eosinophils Relative 12/29/2016 3  % Final  . Basophils Relative 12/29/2016 1  % Final  . Smear Review 12/29/2016 Criteria for review not met   Final  . Sodium 12/29/2016 139  135 - 146 mmol/L Final  . Potassium 12/29/2016 4.2  3.5 - 5.3 mmol/L Final  . Chloride 12/29/2016 100  98 - 110 mmol/L  Final  . CO2 12/29/2016 24  20 - 31 mmol/L Final  . Glucose, Bld 12/29/2016 188* 65 - 99 mg/dL Final  . BUN 12/29/2016 18  7 - 25 mg/dL Final  . Creat 12/29/2016 0.93  0.60 - 0.93 mg/dL Final   Comment:   For patients > or = 76 years of age: The upper reference limit for Creatinine is approximately 13% higher for people identified as African-American.     . Total Bilirubin 12/29/2016 0.4  0.2 - 1.2 mg/dL Final  . Alkaline Phosphatase 12/29/2016 89  33 - 130 U/L Final  . AST 12/29/2016 44* 10 - 35 U/L Final  . ALT 12/29/2016 26  6 - 29 U/L Final  . Total Protein 12/29/2016 6.6  6.1 - 8.1 g/dL Final  . Albumin 12/29/2016 3.8  3.6 - 5.1 g/dL Final  . Calcium 12/29/2016 9.5  8.6 - 10.4 mg/dL Final  . GFR, Est African American 12/29/2016 69  >=60 mL/min Final  . GFR, Est Non African American 12/29/2016 60  >=60 mL/min Final  Office Visit on 11/21/2016  Component Date Value Ref Range Status  . Color, UA 11/21/2016 YELLOW   Final  . Clarity, UA 11/21/2016 CLEAR   Final  . Glucose, UA 11/21/2016 NEG   Final  . Bilirubin, UA 11/21/2016 NEG   Final  . Ketones, UA 11/21/2016 NEG   Final  . Spec Grav, UA 11/21/2016 1.020  1.030 - 1.035 Final  . Blood, UA 11/21/2016 NEG   Final  . pH, UA 11/21/2016 6.0  5.0 - 8.0 Final  . Protein, UA 11/21/2016 NEG   Final  . Urobilinogen, UA 11/21/2016 negative  Negative - 2.0 Final  . Nitrite, UA 11/21/2016 NEG   Final  . Leukocytes, UA 11/21/2016 Negative  Negative Final  . Organism ID, Bacteria 11/21/2016 NO GROWTH   Final  Appointment on 10/06/2016  Component Date Value Ref Range Status  . Hgb A1c MFr Bld 10/06/2016 7.6* <5.7 % Final   Comment:   For someone without known diabetes, a hemoglobin A1c value of 6.5% or greater indicates that they may have diabetes and this should be confirmed with a follow-up test.   For someone with known diabetes, a value <7% indicates that their diabetes is well controlled and a value greater than or equal to  7% indicates suboptimal control. A1c targets should be individualized based on duration of diabetes, age, comorbid conditions, and other considerations.   Currently, no consensus exists for use of hemoglobin A1c for diagnosis of diabetes for children.     . Mean Plasma Glucose 10/06/2016 171  mg/dL Final  . Sodium 10/06/2016 139  135 - 146 mmol/L Final  . Potassium 10/06/2016 4.5  3.5 - 5.3 mmol/L Final  . Chloride 10/06/2016 101  98 - 110 mmol/L Final  . CO2 10/06/2016 27  20 - 31 mmol/L Final  . Glucose, Bld 10/06/2016 178* 65 - 99 mg/dL Final  . BUN 10/06/2016 17  7 - 25 mg/dL Final  . Creat 10/06/2016 0.80  0.60 - 0.93 mg/dL Final   Comment:   For patients > or = 76 years of age: The upper reference limit for Creatinine is approximately 13% higher for people identified as African-American.     . Total Bilirubin 10/06/2016 0.4  0.2 - 1.2 mg/dL Final  . Alkaline Phosphatase 10/06/2016 100  33 - 130 U/L Final  . AST 10/06/2016 37* 10 - 35 U/L Final  . ALT 10/06/2016 33* 6 - 29 U/L Final  . Total Protein  10/06/2016 6.7  6.1 - 8.1 g/dL Final  . Albumin 10/06/2016 3.7  3.6 - 5.1 g/dL Final  . Calcium 10/06/2016 9.4  8.6 - 10.4 mg/dL Final  . GFR, Est African American 10/06/2016 83  >=60 mL/min Final  . GFR, Est Non African American 10/06/2016 72  >=60 mL/min Final  . Uric Acid, Serum 10/06/2016 5.7  2.5 - 7.0 mg/dL Final  Office Visit on 10/03/2016  Component Date Value Ref Range Status  . Sodium 10/03/2016 138  135 - 146 mmol/L Final  . Potassium 10/03/2016 4.3  3.5 - 5.3 mmol/L Final  . Chloride 10/03/2016 100  98 - 110 mmol/L Final  . CO2 10/03/2016 26  20 - 31 mmol/L Final  . Glucose, Bld 10/03/2016 172* 65 - 99 mg/dL Final  . BUN 10/03/2016 23  7 - 25 mg/dL Final  . Creat 10/03/2016 1.00* 0.60 - 0.93 mg/dL Final   Comment:   For patients > or = 76 years of age: The upper reference limit for Creatinine is approximately 13% higher for people identified  as African-American.     . Calcium 10/03/2016 9.7  8.6 - 10.4 mg/dL Final  Appointment on 09/26/2016  Component Date Value Ref Range Status  . Rest HR 09/27/2016 100  bpm Final  . Rest BP 09/27/2016 171/73  mmHg Final  . Peak HR 09/27/2016 111  bpm Final  . Peak BP 09/27/2016 185/88  mmHg Final  . SSS 09/27/2016 1   Final  . SRS 09/27/2016 0   Final  . SDS 09/27/2016 1   Final  . LHR 09/27/2016 0.37   Final  . TID 09/27/2016 0.74   Final  . LV sys vol 09/27/2016 38  mL Final  . LV dias vol 09/27/2016 100  46 - 106 mL Final  Office Visit on 09/22/2016  Component Date Value Ref Range Status  . Sodium 09/22/2016 141  135 - 146 mmol/L Final  . Potassium 09/22/2016 3.9  3.5 - 5.3 mmol/L Final  . Chloride 09/22/2016 103  98 - 110 mmol/L Final  . CO2 09/22/2016 24  20 - 31 mmol/L Final  . Glucose, Bld 09/22/2016 189* 65 - 99 mg/dL Final  . BUN 09/22/2016 14  7 - 25 mg/dL Final  . Creat 09/22/2016 0.93  0.60 - 0.93 mg/dL Final   Comment:   For patients > or = 76 years of age: The upper reference limit for Creatinine is approximately 13% higher for people identified as African-American.     . Calcium 09/22/2016 9.5  8.6 - 10.4 mg/dL Final  Office Visit on 09/06/2016  Component Date Value Ref Range Status  . Magnesium 09/06/2016 2.0  1.6 - 2.3 mg/dL Final  . Glucose 09/06/2016 228* 65 - 99 mg/dL Final  . BUN 09/06/2016 20  8 - 27 mg/dL Final  . Creatinine, Ser 09/06/2016 1.21* 0.57 - 1.00 mg/dL Final  . GFR calc non Af Amer 09/06/2016 44* >59 mL/min/1.73 Final  . GFR calc Af Amer 09/06/2016 51* >59 mL/min/1.73 Final  . BUN/Creatinine Ratio 09/06/2016 17  12 - 28 Final  . Sodium 09/06/2016 141  134 - 144 mmol/L Final  . Potassium 09/06/2016 4.3  3.5 - 5.2 mmol/L Final  . Chloride 09/06/2016 96  96 - 106 mmol/L Final  . CO2 09/06/2016 20  18 - 29 mmol/L Final  . Calcium 09/06/2016 9.8  8.7 - 10.3 mg/dL Final  Office Visit on 08/29/2016  Component Date Value Ref Range Status  .  Brain Natriuretic Peptide 08/29/2016 58.2  <100 pg/mL Final   Comment:   BNP levels increase with age in the general population with the highest values seen in individuals greater than 13 years of age. Reference: Joellyn Rued Cardiol 2002; 67:544-92.        Imaging: Xr Hip Unilat W Or W/o Pelvis 2-3 Views Right  Result Date: 12/29/2016 Mild inferior medial narrowing. These findings were consistent with mild osteoarthritis of the hip joint  Xr Knee 3 View Left  Result Date: 12/29/2016 Moderate to severe medial compartment narrowing. Severe patellofemoral narrowing.  Xr Knee 3 View Right  Result Date: 12/29/2016 Severe bilateral compartmental narrowing and severe patellofemoral narrowing consistent with rheumatoid arthritis   Speciality Comments: No specialty comments available.    Procedures:  No procedures performed Allergies: Penicillins and Sulfamethoxazole-trimethoprim   Assessment / Plan:     Visit Diagnoses: Rheumatoid arthritis involving multiple sites with positive rheumatoid factor (HCC)  Fibromyalgia  High risk medication use  Primary osteoarthritis of both knees   Plan: #1: RA: Doing well at the moment. No obvious synovitis noted on her MCP joints or her right wrist. The left wrist is covered by a splint on the left forearm.  #2: HRRX Taking Xeljanz 5 mg twice a day. (Was off of Morrie Sheldon for several months until her left great toe wound healed and her left tenosynovitis surgery healed. Patient continues to be off of methotrexate. She should restart the medication soon and we will discuss that with her today  #3: Bilateral knee OA and bilateral knee pain. We have applied for Hyalgan but patient has not heard anything back and so we will reapply again. Please see below for full details OA BILATERAL KNEE ==> WE SENT THESE MESSAGES  Dr d asked wendy to apply for hyalgan for both knees In may 2018.  Pt has not heard anything yet.  If needed, please reapply  for hyalgan. She is having severe pain to both knees.  Here is what dr d said:===> Okay to apply for Hyalgan. Her bilateral knee joints. She received her Visco supplement injections in May 2015. --------------------------------------  #4: Return to clinic in 3 months so we can reassess how the patient is doing.  Orders: No orders of the defined types were placed in this encounter.  No orders of the defined types were placed in this encounter.   Face-to-face time spent with patient was 30 minutes. 50% of time was spent in counseling and coordination of care.  Follow-Up Instructions: No Follow-up on file.   Eliezer Lofts, PA-C  Note - This record has been created using [employer].  Chart creation errors have been sought, but may not always  have been located. Such creation errors do not reflect on  the standard of medical care.

## 2017-01-27 NOTE — Telephone Encounter (Signed)
Please call patient and schedule Hyalgan injections x 5 bilateral knees, buy and bill.  She will owe 20% OOP for these, it should be the same as last time she had them. She's had regular medicare for years now.  Thanks-

## 2017-01-29 ENCOUNTER — Other Ambulatory Visit: Payer: Self-pay | Admitting: Rheumatology

## 2017-01-30 NOTE — Telephone Encounter (Signed)
Last Visit: 01/26/17 Next Visit: 05/02/17 Labs :12/29/16 Stable  Okay to refill Uloric?

## 2017-01-30 NOTE — Telephone Encounter (Signed)
Refill Morrie Sheldon. Uloric was entered by mistake.

## 2017-01-30 NOTE — Telephone Encounter (Signed)
Refill uloric or xeljanz?

## 2017-01-31 ENCOUNTER — Other Ambulatory Visit: Payer: Self-pay | Admitting: Internal Medicine

## 2017-02-02 ENCOUNTER — Other Ambulatory Visit: Payer: Self-pay | Admitting: Nurse Practitioner

## 2017-02-03 ENCOUNTER — Other Ambulatory Visit: Payer: Self-pay | Admitting: Internal Medicine

## 2017-02-13 ENCOUNTER — Other Ambulatory Visit: Payer: Self-pay | Admitting: *Deleted

## 2017-02-13 DIAGNOSIS — G8929 Other chronic pain: Secondary | ICD-10-CM

## 2017-02-13 DIAGNOSIS — R5382 Chronic fatigue, unspecified: Principal | ICD-10-CM

## 2017-02-13 DIAGNOSIS — G9332 Myalgic encephalomyelitis/chronic fatigue syndrome: Secondary | ICD-10-CM

## 2017-02-13 DIAGNOSIS — M545 Low back pain: Secondary | ICD-10-CM

## 2017-02-13 DIAGNOSIS — M797 Fibromyalgia: Secondary | ICD-10-CM

## 2017-02-13 MED ORDER — OXYCODONE HCL 5 MG PO TABS: ORAL_TABLET | ORAL | 0 refills | Status: DC

## 2017-02-13 MED ORDER — HYDROCODONE-ACETAMINOPHEN 10-325 MG PO TABS: ORAL_TABLET | ORAL | 0 refills | Status: DC

## 2017-02-13 NOTE — Telephone Encounter (Signed)
Patient requested and will pick up 

## 2017-02-14 ENCOUNTER — Other Ambulatory Visit: Payer: Self-pay | Admitting: Internal Medicine

## 2017-02-20 ENCOUNTER — Ambulatory Visit (INDEPENDENT_AMBULATORY_CARE_PROVIDER_SITE_OTHER): Payer: Medicare Other | Admitting: Internal Medicine

## 2017-02-20 ENCOUNTER — Encounter: Payer: Self-pay | Admitting: Internal Medicine

## 2017-02-20 VITALS — BP 158/72 | HR 103 | Temp 98.5°F | Wt 280.0 lb

## 2017-02-20 DIAGNOSIS — L97529 Non-pressure chronic ulcer of other part of left foot with unspecified severity: Secondary | ICD-10-CM

## 2017-02-20 DIAGNOSIS — R000 Tachycardia, unspecified: Secondary | ICD-10-CM

## 2017-02-20 DIAGNOSIS — M0579 Rheumatoid arthritis with rheumatoid factor of multiple sites without organ or systems involvement: Secondary | ICD-10-CM | POA: Diagnosis not present

## 2017-02-20 DIAGNOSIS — E1142 Type 2 diabetes mellitus with diabetic polyneuropathy: Secondary | ICD-10-CM

## 2017-02-20 DIAGNOSIS — M810 Age-related osteoporosis without current pathological fracture: Secondary | ICD-10-CM

## 2017-02-20 DIAGNOSIS — E039 Hypothyroidism, unspecified: Secondary | ICD-10-CM | POA: Diagnosis not present

## 2017-02-20 DIAGNOSIS — I10 Essential (primary) hypertension: Secondary | ICD-10-CM

## 2017-02-20 DIAGNOSIS — E11621 Type 2 diabetes mellitus with foot ulcer: Secondary | ICD-10-CM | POA: Diagnosis not present

## 2017-02-20 DIAGNOSIS — Z794 Long term (current) use of insulin: Secondary | ICD-10-CM

## 2017-02-20 LAB — CBC WITH DIFFERENTIAL/PLATELET
Basophils Absolute: 157 cells/uL (ref 0–200)
Basophils Relative: 1 %
Eosinophils Absolute: 314 cells/uL (ref 15–500)
Eosinophils Relative: 2 %
HCT: 43.9 % (ref 35.0–45.0)
Hemoglobin: 14.2 g/dL (ref 11.7–15.5)
Lymphocytes Relative: 17 %
Lymphs Abs: 2669 cells/uL (ref 850–3900)
MCH: 27.7 pg (ref 27.0–33.0)
MCHC: 32.3 g/dL (ref 32.0–36.0)
MCV: 85.7 fL (ref 80.0–100.0)
MPV: 10.7 fL (ref 7.5–12.5)
Monocytes Absolute: 1099 cells/uL — ABNORMAL HIGH (ref 200–950)
Monocytes Relative: 7 %
Neutro Abs: 11461 cells/uL — ABNORMAL HIGH (ref 1500–7800)
Neutrophils Relative %: 73 %
Platelets: 321 10*3/uL (ref 140–400)
RBC: 5.12 MIL/uL — ABNORMAL HIGH (ref 3.80–5.10)
RDW: 14.9 % (ref 11.0–15.0)
WBC: 15.7 10*3/uL — ABNORMAL HIGH (ref 3.8–10.8)

## 2017-02-20 LAB — COMPLETE METABOLIC PANEL WITH GFR
ALT: 32 U/L — ABNORMAL HIGH (ref 6–29)
AST: 39 U/L — ABNORMAL HIGH (ref 10–35)
Albumin: 3.9 g/dL (ref 3.6–5.1)
Alkaline Phosphatase: 113 U/L (ref 33–130)
BUN: 16 mg/dL (ref 7–25)
CO2: 28 mmol/L (ref 20–31)
Calcium: 9.6 mg/dL (ref 8.6–10.4)
Chloride: 99 mmol/L (ref 98–110)
Creat: 0.81 mg/dL (ref 0.60–0.93)
GFR, Est African American: 82 mL/min (ref >=60)
GFR, Est Non African American: 71 mL/min (ref >=60)
Glucose, Bld: 146 mg/dL — ABNORMAL HIGH (ref 65–99)
Potassium: 4.3 mmol/L (ref 3.5–5.3)
Sodium: 139 mmol/L (ref 135–146)
Total Bilirubin: 0.5 mg/dL (ref 0.2–1.2)
Total Protein: 7 g/dL (ref 6.1–8.1)

## 2017-02-20 LAB — TSH: TSH: 1.43 mIU/L

## 2017-02-20 MED ORDER — METOPROLOL TARTRATE 100 MG PO TABS: 100.0000 mg | ORAL_TABLET | Freq: Two times a day (BID) | ORAL | 3 refills | Status: DC

## 2017-02-20 NOTE — Patient Instructions (Addendum)
Increase your lasix to 2 pills twice a day for 3 days.  Check your weight daily at home.  Call back if weight is not going down. Schedule a follow-up with Dr. Stanford Breed about your volume overload.   Increase lopressor to 100mg  twice a day.  New rx sent to pharmacy. We will schedule you an appointment with the wound care center. Also reschedule pulmonary appointment.

## 2017-02-20 NOTE — Progress Notes (Signed)
Location:  [facility] clinic Provider:  Aleesa Sweigert L. Mariea Clonts, D.O., C.M.D.  Code Status: DNR Goals of Care:  Advanced Directives 02/20/2017  Does Patient Have a Medical Advance Directive? Yes  Type of Advance Directive Out of facility DNR (pink MOST or yellow form)  Does patient want to make changes to medical advance directive? -  Copy of [place] in Chart? -  Would patient like information on creating a medical advance directive? -  Pre-existing out of facility DNR order (yellow form or pink MOST form) Pink MOST form placed in chart (order not valid for inpatient use)   Chief Complaint  Patient presents with  . Medical Management of Chronic Issues    67mths follow-up, wants TSH checked    HPI: Patient is a 76 y.o. female seen today for medical management of chronic diseases.    Toe infection comes and goes.  Left great toe.  Has plantar wart on first mtp, but wound on bottom of toe.  Using antibiotic ointment on it and son is dressing it with a bandaid and wrapping it.  Has seen Dr. Sharol Given about this before.  Has had keflex and doxycycline when infected.  There were concerns that if erythema moved up her foot, there could be osteomyelitis.  Does her best to elevate the leg which is more swollen than the right.    Legs are so swollen that they rub together.  She is taking lasix 20mg  po bid.  It never completely goes back to normal.  She's trying to cut out all known sodium and not adding sodium.  Rinses canned veggies.    She had a "ton of work" on her left hand--had carpal tunnel surgery, some tendon repair and removal of the cyst on her posterior hand with Dr. Joycelyn Man exercises.  She's released from his care now due to progress.  Still working on strength and getting sensation back.    HR remains elevated and bp not at goal.    Past Medical History:  Diagnosis Date  . Abnormal weight gain   . Abnormality of gait 04/19/2016  . Acute infective polyneuritis ([place]) 2002    . Acute maxillary sinusitis   . Acute sinusitis, unspecified   . Allergic rhinitis due to pollen   . Arthritis   . Back injury   . Candidiasis of skin and nails   . Candidiasis of vulva and vagina   . Chronic pain syndrome   . COPD (chronic obstructive pulmonary disease) ([place])   . Cough   . Degenerative arthritis   . Depressive disorder, not elsewhere classified   . Diabetes mellitus without complication ([place])   . Diaphragmatic hernia without mention of obstruction or gangrene   . Dyslipidemia   . Edema   . Fibromyalgia   . GERD (gastroesophageal reflux disease)   . Guillain-Barre syndrome ([place])   . History of benign thymus tumor   . Hypertension   . Insomnia, unspecified   . Lumbago   . Miscarriage 1962  . Mixed hyperlipidemia   . Morbid obesity ([place])   . Obstructive chronic bronchitis with acute bronchitis ([place])   . Osteopenia, senile   . Osteoporosis   . Other malaise and fatigue   . Other specified disease of white blood cells   . Pain in joint, multiple sites   . Polyneuropathy in diabetes(357.2)   . RA (rheumatoid arthritis) ([place])   . Reflux esophagitis   . Rheumatoid arthritis(714.0)   . Shortness of breath   .  Spinal stenosis, lumbar region, without neurogenic claudication   . Spondylosis, lumbosacral   . Spontaneous ecchymoses   . Stiffness of joints, not elsewhere classified, multiple sites   . Tear film insufficiency, unspecified   . Thyroid disease   . Thyroid disorder   . Type II or unspecified type diabetes mellitus with peripheral circulatory disorders, uncontrolled(250.72)   . Unspecified chronic bronchitis ([place])   . Unspecified essential hypertension   . Unspecified hypothyroidism   . Unspecified pruritic disorder   . Unspecified urinary incontinence   . Urinary tract infection, site not specified     Past Surgical History:  Procedure Laterality Date  . ABDOMINAL HYSTERECTOMY  1974  . abdominal tumor  2002  . APPENDECTOMY    . [place]  . BRONCHOSCOPY  2001  . CHOLECYSTECTOMY  1984  . KNEE SURGERY Bilateral 08/27/2010 (L) and 01/11/2011 (R)  . [place]  . OVARIAN CYST SURGERY  1968  . thymus tumor    . 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 TONSILLECTOMY      Allergies  Allergen Reactions  . Penicillins Hives, Itching, Swelling and Rash  . Sulfamethoxazole-Trimethoprim Itching    Allergies as of 02/20/2017      Reactions   Penicillins Hives, Itching, Swelling, Rash   Sulfamethoxazole-trimethoprim Itching      Medication List       Accurate as of 02/20/17 11:14 AM. Always use your most recent med list.          ACCU-CHEK SMARTVIEW test strip Generic drug:  glucose blood Check blood sugar 3-4 times daily   amLODipine 10 MG tablet Commonly known as:  NORVASC Take 1 tablet (10 mg total) by mouth daily.   aspirin EC 81 MG tablet Take 81 mg by mouth daily as needed.   folic acid 1 MG tablet Commonly known as:  FOLVITE Take 2 tablets (2 mg total) by mouth daily.   furosemide 20 MG tablet Commonly known as:  LASIX Take 20 mg by mouth 2 (two) times daily.   gabapentin 600 MG tablet Commonly known as:  NEURONTIN TAKE 1 TABLET BY MOUTH THREE TIMES DAILY   HYDROcodone-acetaminophen 10-325 MG tablet Commonly known as:  NORCO Take one tablet every three hours as needed for pain   Insulin Lispro 200 UNIT/ML Sopn Commonly known as:  HUMALOG KWIKPEN Inject 20 Units into the skin 3 (three) times daily before meals.   levothyroxine 137 MCG tablet Commonly known as:  SYNTHROID, LEVOTHROID TAKE 1 TABLET BY MOUTH DAILY BEFORE BREAKFAST ON AN EMPTY STOMACH( LABS OVERDUE)   lisinopril 20 MG tablet Commonly known as:  PRINIVIL,ZESTRIL Take 2 tablets (40 mg total) by mouth daily.   loperamide 2 MG tablet Commonly known as:  IMODIUM A-D Take 2 mg by mouth 4 (four) times daily as needed for diarrhea or loose stools.   magnesium oxide 400 MG tablet Commonly known as:  MAG-OX Take 400 mg by mouth daily as  needed.   Melatonin 10 MG Caps Take 1 capsule by mouth at bedtime   metoprolol tartrate 50 MG tablet Commonly known as:  LOPRESSOR Take 1.5 tablets (75 mg total) by mouth 2 (two) times daily.   nystatin powder Generic drug:  nystatin APPLY 1 GRAM TOPICALLY TO THE AFFECTED AREA DAILY AS NEEDED FOR ITCHING   nystatin-triamcinolone cream Commonly known as:  MYCOLOG II Apply 1 application topically 4 (four) times daily as needed (for rash). Apply under abdomen folds twice daily until rash  healed   omega-3 acid ethyl esters 1 g capsule Commonly known as:  LOVAZA TAKE ONE CAPSULE BY MOUTH EVERY DAY   oxyCODONE 5 MG immediate release tablet Commonly known as:  Oxy IR/ROXICODONE Take one tablet every 6 hours as needed for severe pain, Take 2 tablets at bedtime.   polyvinyl alcohol 1.4 % ophthalmic solution Commonly known as:  LIQUIFILM TEARS Place 1 drop into both eyes as needed (for dry eyes).   potassium chloride 10 MEQ CR capsule Commonly known as:  MICRO-K Take 2 capsules by mouth 2 (two) times daily. While on lasix   promethazine 12.5 MG tablet Commonly known as:  PHENERGAN TAKE 1 TABLET BY MOUTH AS NEEDED FOR SEVERE NAUSEA AS DIRECTED   SPIRIVA HANDIHALER 18 MCG inhalation capsule Generic drug:  tiotropium INHALE THE CONTENTS OF 1 CAPSULE VIA HANDIHALER BY MOUTH DAILY FOR BREATHING   TOUJEO SOLOSTAR 300 UNIT/ML Sopn Generic drug:  Insulin Glargine INJECT 100 UNITS UNDER THE SKIN AT BEDTIME   triamterene-hydrochlorothiazide 37.5-25 MG tablet Commonly known as:  MAXZIDE-25 GIVE "Jenia" 1 TABLET BY MOUTH DAILY FOR BLOOD PRESSURE   Tuberculin-Allergy Syringes 27G X 1/2" 1 ML Misc 1 Syringe by Does not apply route once a week.   venlafaxine XR 75 MG 24 hr capsule Commonly known as:  EFFEXOR-XR Take 1 capsule (75 mg total) by mouth daily with breakfast.   VICTOZA 18 MG/3ML Sopn Generic drug:  liraglutide ADMINISTER 1.8 MG UNDER THE SKIN DAILY FOR BLOOD SUGAR     Vitamin D 2000 units tablet Take 2,000 Units by mouth daily.   XELJANZ 5 MG Tabs Generic drug:  Tofacitinib Citrate TAKE 1 TABLET BY MOUTH TWICE DAILY       Review of Systems:  Review of Systems  Constitutional: Positive for malaise/fatigue. Negative for chills and fever.  HENT: Negative for congestion.   Eyes: Negative for blurred vision.  Respiratory: Positive for shortness of breath. Negative for cough and wheezing.   Cardiovascular: Positive for leg swelling. Negative for chest pain and palpitations.  Gastrointestinal: Negative for abdominal pain, blood in stool, constipation and melena.  Genitourinary: Negative for dysuria.  Musculoskeletal: Positive for back pain and joint pain. Negative for falls.  Skin:       Left great toe ulcer  Neurological: Negative for dizziness, loss of consciousness and weakness.  Psychiatric/Behavioral: Positive for depression. Negative for memory loss. The patient is nervous/anxious. The patient does not have insomnia.     Health Maintenance  Topic Date Due  . FOOT EXAM  09/27/2016  . OPHTHALMOLOGY EXAM  08/21/2017 (Originally 12/20/1950)  . INFLUENZA VACCINE  08/22/2019 (Originally 03/22/2017)  . HEMOGLOBIN A1C  04/05/2017  . PNA vac Low Risk Adult (2 of 2 - PPSV23) 07/07/2017  . DEXA SCAN  09/20/2025  . TETANUS/TDAP  05/28/2026    Physical Exam: Vitals:   02/20/17 1042  BP: (!) 158/72  Pulse: (!) 103  Temp: 98.5 F (36.9 C)  TempSrc: Oral  SpO2: 95%  Weight: 280 lb (127 kg)   Body mass index is 45.19 kg/m. Physical Exam  Constitutional: She is oriented to person, place, and time. She appears well-developed and well-nourished.  obese  Cardiovascular: Normal heart sounds.   Tachy regular; edema left leg greater than right with pitting 2+, bilateral thighs are also edematous and abdomen appears distended also  Pulmonary/Chest: Effort normal and breath sounds normal. No respiratory distress.  Abdominal: Bowel sounds are normal.   Musculoskeletal: Normal range of motion. She exhibits no edema  or tenderness.  Neurological: She is alert and oriented to person, place, and time.  Skin: Skin is warm.  Left great toe pink, has callous with open incision in the middle of it, plantar wart on first MTP  Psychiatric:  Anxious and jumping topic to topic    Labs reviewed: Basic Metabolic Panel:  Recent Labs  04/01/16 0900  09/06/16 1514  10/03/16 1330 10/06/16 0835 12/29/16 1021  NA 142  < > 141  < > 138 139 139  K 3.9  < > 4.3  < > 4.3 4.5 4.2  CL 104  < > 96  < > 100 101 100  CO2 28  < > 20  < > 26 27 24   GLUCOSE 63*  < > 228*  < > 172* 178* 188*  BUN 22  < > 20  < > 23 17 18   CREATININE 0.82  < > 1.21*  < > 1.00* 0.80 0.93  CALCIUM 9.4  < > 9.8  < > 9.7 9.4 9.5  MG  --   --  2.0  --   --   --   --   TSH 0.23*  --   --   --   --   --   --   < > = values in this interval not displayed. Liver Function Tests:  Recent Labs  07/07/16 0830 10/06/16 0835 12/29/16 1021  AST 54* 37* 44*  ALT 38* 33* 26  ALKPHOS 99 100 89  BILITOT 0.5 0.4 0.4  PROT 6.7 6.7 6.6  ALBUMIN 3.9 3.7 3.8   No results for input(s): LIPASE, AMYLASE in the last 8760 hours. No results for input(s): AMMONIA in the last 8760 hours. CBC:  Recent Labs  07/06/16 1456 07/18/16 1107 12/29/16 1021  WBC 17.4* 21.1* 12.0*  NEUTROABS 14,094* 18,146* 8,760*  HGB 14.6 14.3 13.5  HCT 44.6 44.8 41.5  MCV 83.1 85.0 84.5  PLT 361 364 308   Lipid Panel:  Recent Labs  04/01/16 0900  CHOL 143  HDL 46  LDLCALC 80  TRIG 85  CHOLHDL 3.1   Lab Results  Component Value Date   HGBA1C 7.6 (H) 10/06/2016   Lab Results  Component Value Date   TSH 0.23 (L) 04/01/2016    Assessment/Plan 1. Hypothyroidism, unspecified type -cont levothyroxine, but needs f/u lab- TSH  2. Type 2 diabetes mellitus with diabetic polyneuropathy, with long-term current use of insulin (HCC) - cont current regimen and check labs: - CBC with  Differential/Platelet - Hemoglobin A1c - COMPLETE METABOLIC PANEL WITH GFR - AMB referral to wound care center  3. Rheumatoid arthritis involving multiple sites with positive rheumatoid factor (HCC) -following with Dr. Odette Horns mgt per her--xeljanz  4. Essential hypertension - bp not at goal, increase lopressor to 100mg  po bid and cont other meds as ordered - metoprolol tartrate (LOPRESSOR) 100 MG tablet; Take 1 tablet (100 mg total) by mouth 2 (two) times daily.  Dispense: 180 tablet; Refill: 3 -counseled on low sodium diet  5. Age-related osteoporosis without current pathological fracture -continues on vitamin d  6. Tachycardia with heart rate 100-120 beats per minute - apparently not taking the 75mg  po bid lopressor as was directed, but due to high bp and edema, increase lopressor and also double lasix for 3 days and follow weights--call if weight not coming down after the three days - metoprolol tartrate (LOPRESSOR) 100 MG tablet; Take 1 tablet (100 mg total) by mouth 2 (two) times daily.  Dispense:  180 tablet; Refill: 3  7. Diabetic ulcer of toe of left foot associated with type 2 diabetes mellitus, unspecified ulcer stage (HCC) -left great toe ulcer not healing -has had cellulitis on a couple of previous occasions and followed with Dr. Sharol Given -b/c it's not healing and she is high risk for progression and osteomyelitis, refer to wound care center -is elevating it at rest - AMB referral to wound care center  Labs/tests ordered:   Orders Placed This Encounter  Procedures  . TSH  . CBC with Differential/Platelet  . Hemoglobin A1c  . COMPLETE METABOLIC PANEL WITH GFR  . AMB referral to wound care center    Referral Priority:   Routine    Referral Type:   Consultation    Number of Visits Requested:   1    Next appt: 06/01/2017 med [place]. Eran Mistry, D.O. [facility] [address], [place] [address] Cell  Phone (Mon-Fri 8am-5pm):  [phone] On Call:  [phone] & follow prompts after 5pm & weekends Office Phone:  [phone] Office Fax:  [phone]

## 2017-02-21 ENCOUNTER — Other Ambulatory Visit: Payer: Self-pay | Admitting: Internal Medicine

## 2017-02-21 LAB — HEMOGLOBIN A1C
Hgb A1c MFr Bld: 8.2 % — ABNORMAL HIGH (ref <5.7)
Mean Plasma Glucose: 189 mg/dL

## 2017-02-24 ENCOUNTER — Telehealth: Payer: Self-pay | Admitting: *Deleted

## 2017-02-24 ENCOUNTER — Encounter: Payer: Self-pay | Admitting: *Deleted

## 2017-02-24 ENCOUNTER — Other Ambulatory Visit: Payer: Self-pay | Admitting: Internal Medicine

## 2017-02-24 DIAGNOSIS — M0579 Rheumatoid arthritis with rheumatoid factor of multiple sites without organ or systems involvement: Secondary | ICD-10-CM

## 2017-02-24 DIAGNOSIS — L97524 Non-pressure chronic ulcer of other part of left foot with necrosis of bone: Secondary | ICD-10-CM

## 2017-02-24 NOTE — Addendum Note (Signed)
Addended by: Despina Hidden on: 02/24/2017 04:14 PM   Modules accepted: Orders

## 2017-02-24 NOTE — Telephone Encounter (Signed)
Please schedule Hyalgan x 5 inj, Bilateral with Dr. Estanislado Pandy, buy and bill. Thank you.

## 2017-02-26 ENCOUNTER — Other Ambulatory Visit: Payer: Self-pay | Admitting: Rheumatology

## 2017-02-27 ENCOUNTER — Other Ambulatory Visit: Payer: Medicare Other

## 2017-02-27 ENCOUNTER — Ambulatory Visit
Admission: RE | Admit: 2017-02-27 | Discharge: 2017-02-27 | Disposition: A | Discharge status: Home / Self Care | Payer: Medicare Other | Source: Ambulatory Visit | Attending: Internal Medicine | Admitting: Internal Medicine

## 2017-02-27 DIAGNOSIS — L97529 Non-pressure chronic ulcer of other part of left foot with unspecified severity: Secondary | ICD-10-CM | POA: Diagnosis not present

## 2017-02-27 DIAGNOSIS — M0579 Rheumatoid arthritis with rheumatoid factor of multiple sites without organ or systems involvement: Secondary | ICD-10-CM

## 2017-02-27 DIAGNOSIS — L97524 Non-pressure chronic ulcer of other part of left foot with necrosis of bone: Secondary | ICD-10-CM

## 2017-02-27 NOTE — Telephone Encounter (Signed)
Last Visit: 01/26/17 Next Visit: 05/02/17 Labs: 12/29/16 Stable  Okay to refill per Dr. Estanislado Pandy

## 2017-02-28 DIAGNOSIS — M0579 Rheumatoid arthritis with rheumatoid factor of multiple sites without organ or systems involvement: Secondary | ICD-10-CM | POA: Diagnosis not present

## 2017-02-28 LAB — SEDIMENTATION RATE: Sed Rate: 14 mm/hr (ref 0–30)

## 2017-03-02 ENCOUNTER — Encounter: Payer: Self-pay | Admitting: *Deleted

## 2017-03-02 ENCOUNTER — Encounter: Payer: Medicare Other | Attending: Physician Assistant | Admitting: Physician Assistant

## 2017-03-02 DIAGNOSIS — E785 Hyperlipidemia, unspecified: Secondary | ICD-10-CM | POA: Insufficient documentation

## 2017-03-02 DIAGNOSIS — K219 Gastro-esophageal reflux disease without esophagitis: Secondary | ICD-10-CM | POA: Insufficient documentation

## 2017-03-02 DIAGNOSIS — E11610 Type 2 diabetes mellitus with diabetic neuropathic arthropathy: Secondary | ICD-10-CM | POA: Diagnosis not present

## 2017-03-02 DIAGNOSIS — Z6841 Body Mass Index (BMI) 40.0 and over, adult: Secondary | ICD-10-CM | POA: Insufficient documentation

## 2017-03-02 DIAGNOSIS — I10 Essential (primary) hypertension: Secondary | ICD-10-CM | POA: Insufficient documentation

## 2017-03-02 DIAGNOSIS — I872 Venous insufficiency (chronic) (peripheral): Secondary | ICD-10-CM | POA: Diagnosis not present

## 2017-03-02 DIAGNOSIS — Z882 Allergy status to sulfonamides status: Secondary | ICD-10-CM | POA: Insufficient documentation

## 2017-03-02 DIAGNOSIS — F329 Major depressive disorder, single episode, unspecified: Secondary | ICD-10-CM | POA: Insufficient documentation

## 2017-03-02 DIAGNOSIS — J449 Chronic obstructive pulmonary disease, unspecified: Secondary | ICD-10-CM | POA: Insufficient documentation

## 2017-03-02 DIAGNOSIS — Z880 Allergy status to penicillin: Secondary | ICD-10-CM | POA: Insufficient documentation

## 2017-03-02 DIAGNOSIS — M797 Fibromyalgia: Secondary | ICD-10-CM | POA: Insufficient documentation

## 2017-03-02 DIAGNOSIS — Z87891 Personal history of nicotine dependence: Secondary | ICD-10-CM | POA: Diagnosis not present

## 2017-03-02 DIAGNOSIS — M79672 Pain in left foot: Secondary | ICD-10-CM | POA: Insufficient documentation

## 2017-03-02 DIAGNOSIS — E669 Obesity, unspecified: Secondary | ICD-10-CM | POA: Diagnosis not present

## 2017-03-02 DIAGNOSIS — E11621 Type 2 diabetes mellitus with foot ulcer: Secondary | ICD-10-CM | POA: Diagnosis not present

## 2017-03-02 DIAGNOSIS — E1169 Type 2 diabetes mellitus with other specified complication: Secondary | ICD-10-CM | POA: Insufficient documentation

## 2017-03-02 DIAGNOSIS — E1142 Type 2 diabetes mellitus with diabetic polyneuropathy: Secondary | ICD-10-CM | POA: Insufficient documentation

## 2017-03-02 DIAGNOSIS — M0800 Unspecified juvenile rheumatoid arthritis of unspecified site: Secondary | ICD-10-CM | POA: Insufficient documentation

## 2017-03-02 DIAGNOSIS — M3500 Sicca syndrome, unspecified: Secondary | ICD-10-CM | POA: Insufficient documentation

## 2017-03-02 DIAGNOSIS — L97529 Non-pressure chronic ulcer of other part of left foot with unspecified severity: Secondary | ICD-10-CM | POA: Diagnosis not present

## 2017-03-02 DIAGNOSIS — L84 Corns and callosities: Secondary | ICD-10-CM | POA: Diagnosis not present

## 2017-03-04 NOTE — Progress Notes (Signed)
Delgado, Katherine W. ([id number]) Visit Report for 03/02/2017 Abuse/Suicide Risk Screen Details Patient Name: Katherine Delgado, Katherine Delgado. Date of Service: 03/02/2017 10:30 AM Medical Record Number: [id number] Patient Account Number: [id number] Date of Birth/Sex: [birth year] (76 y.o. Female) Treating RN: Carolyne Fiscal, Debi Primary Care Kentavious Michele: Hollace Kinnier Other Clinician: Referring Attikus Bartoszek: Hollace Kinnier Treating Arian Murley/Extender: Melburn Hake, HOYT Weeks in Treatment: 0 Abuse/Suicide Risk Screen Items Answer ABUSE/SUICIDE RISK SCREEN: Has anyone close to you tried to hurt or harm you recentlyo No Do you feel uncomfortable with anyone in your familyo No Has anyone forced you do things that you didnot want to doo No Do you have any thoughts of harming yourselfo No Patient displays signs or symptoms of abuse and/or neglect. No Electronic Signature(s) Signed: 03/02/2017 4:28:11 PM By: Alric Quan Entered By: Alric Quan on 03/02/2017 10:18:22 Jaggers, Satina W. ([id number]) -------------------------------------------------------------------------------- Activities of Daily Living Details Patient Name: Katherine Delgado Date of Service: 03/02/2017 10:30 AM Medical Record Number: [id number] Patient Account Number: [id number] Date of Birth/Sex: March 28, 1941 (76 y.o. Female) Treating RN: Carolyne Fiscal, Debi Primary Care Micael Barb: Hollace Kinnier Other Clinician: Referring Harshil Cavallaro: Hollace Kinnier Treating Yanis Larin/Extender: Melburn Hake, HOYT Weeks in Treatment: 0 Activities of Daily Living Items Answer Activities of Daily Living (Please select one for each item) Drive Automobile Completely Able Take Medications Completely Able Use Telephone Completely Able Care for Appearance Completely Able Use Toilet Completely Able Bath / Shower Completely Able Dress Self Completely Able Feed Self Completely Able Walk Completely Able Get In / Out Bed Completely Able Housework Completely Able Prepare Meals Completely  [place] for Self Completely Able Electronic Signature(s) Signed: 03/02/2017 4:28:11 PM By: Alric Quan Entered By: Alric Quan on 03/02/2017 10:18:37 Minich, Jazara W. ([id number]) -------------------------------------------------------------------------------- Education Assessment Details Patient Name: Katherine Delgado Date of Service: 03/02/2017 10:30 AM Medical Record Number: [id number] Patient Account Number: [id number] Date of Birth/Sex: [birth year] (76 y.o. Female) Treating RN: Carolyne Fiscal, Debi Primary Care Zamere Pasternak: Hollace Kinnier Other Clinician: Referring Zarina Pe: Hollace Kinnier Treating Truth Barot/Extender: Worthy Keeler Weeks in Treatment: 0 Learning Preferences/Education Level/Primary Language Learning Preference: Explanation, Printed Material Highest Education Level: College or Above Preferred Language: English Cognitive Barrier Assessment/Beliefs Language Barrier: No Translator Needed: No Memory Deficit: No Emotional Barrier: No Cultural/Religious Beliefs Affecting Medical No Care: Physical Barrier Assessment Impaired Vision: No Impaired Hearing: No Decreased Hand dexterity: No Knowledge/Comprehension Assessment Knowledge Level: Medium Comprehension Level: Medium Ability to understand written Medium instructions: Ability to understand verbal Medium instructions: Motivation Assessment Anxiety Level: Calm Cooperation: Cooperative Education Importance: Acknowledges Need Interest in Health Problems: Asks Questions Perception: Coherent Willingness to Engage in Self- Medium Management Activities: Readiness to Engage in Self- Medium Management Activities: Electronic Signature(s) Signed: 03/02/2017 4:28:11 PM By: Sidonie Dickens, Sanai W. ([id number]) Entered By: Alric Quan on 03/02/2017 10:18:58 Desha, Anjel W. ([id number]) -------------------------------------------------------------------------------- Fall  Risk Assessment Details Patient Name: Katherine Delgado Date of Service: 03/02/2017 10:30 AM Medical Record Number: [id number] Patient Account Number: [id number] Date of Birth/Sex: 13-Mar-1941 (76 y.o. Female) Treating RN: Carolyne Fiscal, Debi Primary Care Aayana Reinertsen: Hollace Kinnier Other Clinician: Referring Sharbel Sahagun: Hollace Kinnier Treating Tomeka Kantner/Extender: Melburn Hake, HOYT Weeks in Treatment: 0 Fall Risk Assessment Items Have you had 2 or more falls in the last 12 monthso 0 No Have you had any fall that resulted in injury in the last 12 monthso 0 No FALL RISK ASSESSMENT: History of falling - immediate or within 3 months 0 No Secondary diagnosis 15 Yes Ambulatory aid None/bed rest/wheelchair/nurse 0 No Crutches/cane/walker 0 No  Furniture 0 No IV Access/Saline Lock 0 No Gait/Training Normal/bed rest/immobile 0 No Weak 0 No Impaired 0 No Mental Status Oriented to own ability 0 Yes Electronic Signature(s) Signed: 03/02/2017 4:28:11 PM By: Alric Quan Entered By: Alric Quan on 03/02/2017 10:19:08 Guglielmo, Modell W. ([id number]) -------------------------------------------------------------------------------- Foot Assessment Details Patient Name: Katherine Delgado Date of Service: 03/02/2017 10:30 AM Medical Record Number: [id number] Patient Account Number: [id number] Date of Birth/Sex: Jun 12, 1941 (76 y.o. Female) Treating RN: Carolyne Fiscal, Debi Primary Care Telena Peyser: Hollace Kinnier Other Clinician: Referring Daneshia Tavano: Hollace Kinnier Treating Jamieka Royle/Extender: Melburn Hake, HOYT Weeks in Treatment: 0 Foot Assessment Items Site Locations + = Sensation present, - = Sensation absent, C = Callus, U = Ulcer R = Redness, W = Warmth, M = Maceration, PU = Pre-ulcerative lesion F = Fissure, S = Swelling, D = Dryness Assessment Right: Left: Other Deformity: No No Prior Foot Ulcer: No No Prior Amputation: No No Charcot Joint: No No Ambulatory Status: Ambulatory Without Help Gait:  Steady Electronic Signature(s) Signed: 03/02/2017 4:28:11 PM By: Alric Quan Entered By: Alric Quan on 03/02/2017 10:20:09 Carrington, Anise W. ([id number]) -------------------------------------------------------------------------------- Nutrition Risk Assessment Details Patient Name: Katherine Delgado Date of Service: 03/02/2017 10:30 AM Medical Record Number: [id number] Patient Account Number: [id number] Date of Birth/Sex: 01/06/1941 (76 y.o. Female) Treating RN: Carolyne Fiscal, Debi Primary Care Tali Coster: Hollace Kinnier Other Clinician: Referring Alajia Schmelzer: Hollace Kinnier Treating Delaine Hernandez/Extender: Melburn Hake, HOYT Weeks in Treatment: 0 Height (in): 66 Weight (lbs): 272.2 Body Mass Index (BMI): 43.9 Nutrition Risk Assessment Items NUTRITION RISK SCREEN: I have an illness or condition that made me change the kind and/or 0 No amount of food I eat I eat fewer than two meals per day 0 No I eat few fruits and vegetables, or milk products 0 No I have three or more drinks of beer, liquor or wine almost every day 0 No I have tooth or mouth problems that make it hard for me to eat 0 No I don't always have enough money to buy the food I need 0 No I eat alone most of the time 0 No I take three or more different prescribed or over-the-counter drugs a 1 Yes day Without wanting to, I have lost or gained 10 pounds in the last six 0 No months I am not always physically able to shop, cook and/or feed myself 0 No Nutrition Protocols Good Risk Protocol Moderate Risk Protocol Electronic Signature(s) Signed: 03/02/2017 4:28:11 PM By: Alric Quan Entered By: Alric Quan on 03/02/2017 10:19:16

## 2017-03-05 NOTE — Progress Notes (Signed)
Teater, Kizzi W. ([id number]) Visit Report for 03/02/2017 Chief Complaint Document Details Patient Name: Katherine Delgado, Katherine Delgado. Date of Service: 03/02/2017 10:30 AM Medical Record Number: [id number] Patient Account Number: [id number] Date of Birth/Sex: August 21, 1941 (76 y.o. Female) Treating RN: Carolyne Fiscal, Debi Primary Care Provider: Hollace Kinnier Other Clinician: Referring Provider: Hollace Kinnier Treating Provider/Extender: Melburn Hake, Jerrold Haskell Weeks in Treatment: 0 Information Obtained from: Patient Chief Complaint Left foot pain and callus Electronic Signature(s) Signed: 03/02/2017 5:22:22 PM By: Worthy Keeler PA-C Entered By: Worthy Keeler on 03/02/2017 13:03:01 Sarrazin, Fronnie W. ([id number]) -------------------------------------------------------------------------------- HPI Details Patient Name: Katherine Delgado Date of Service: 03/02/2017 10:30 AM Medical Record Number: [id number] Patient Account Number: [id number] Date of Birth/Sex: [birth year] (76 y.o. Female) Treating RN: Carolyne Fiscal, Debi Primary Care Provider: Hollace Kinnier Other Clinician: Referring Provider: Hollace Kinnier Treating Provider/Extender: Melburn Hake, Aleisa Howk Weeks in Treatment: 0 History of Present Illness HPI Description: 03/02/17 on presentation today patient is noted to have a history which includes that of depression, peripheral neuropathy, Guillain-Barro syndrome, hypertension, COPD, fibromyalgia, diabetes mellitus type II, Sjogrens syndrome, and rheumatoid arthritis. She is cared for at [facility] by rheumatology. She also has previously been seen in January 2018 most recently by Dr. Sharol Given in regard to a left first toe ulcer. They treated her with doxycycline at that point although following that last evaluation in January she has not been seen by them since. In fact I could not find where anyone has really been caring for her foot and specifically that toe wound since. However she did have an x-ray on 02/27/17 of the left  foot which showed no definitive osteomyelitis and just what appeared to be arthritic changes. She does also have diabetes and her hemoglobin A1c most recently which was performed at the end of June 2018 was 8.2. This was higher than her previous evaluation four months prior and definitely higher than her evaluation when you're previous. These were 7.6 and 6.6 respectively. Her main concern today is that of the plantar surface of her left foot and her left great toe where she has regions of discoloration that she feels is an ulcer. She is having discomfort at the site but no evidence of infection locally or systemically. Electronic Signature(s) Signed: 03/03/2017 6:13:19 PM By: Worthy Keeler PA-C Entered By: Worthy Keeler on 03/03/2017 08:27:27 Hagberg, Zianne W. ([id number]) -------------------------------------------------------------------------------- Physical Exam Details Patient Name: Katherine Delgado Date of Service: 03/02/2017 10:30 AM Medical Record Number: [id number] Patient Account Number: [id number] Date of Birth/Sex: July 19, 1941 (76 y.o. Female) Treating RN: Carolyne Fiscal, Debi Primary Care Provider: Hollace Kinnier Other Clinician: Referring Provider: Hollace Kinnier Treating Provider/Extender: STONE III, Blessed Cotham Weeks in Treatment: 0 Constitutional Obese and well-hydrated in no acute distress. Eyes conjunctiva clear no eyelid edema noted. pupils equal round and reactive to light and accommodation. Ears, Nose, Mouth, and Throat no gross abnormality of ear auricles or external auditory canals. normal hearing noted during conversation. mucus membranes moist. Respiratory normal breathing without difficulty. clear to auscultation bilaterally. Cardiovascular regular rate and rhythm with normal S1, S2. 2+ dorsalis pedis/posterior tibialis pulses. no clubbing, cyanosis, significant edema, <3 sec cap refill. Gastrointestinal (GI) soft, non-tender, non-distended, +BS. no ventral hernia  noted. Musculoskeletal normal gait and posture. no significant deformity or arthritic changes, no loss or range of motion, no clubbing. Psychiatric this patient is able to make decisions and demonstrates good insight into disease process. Alert and Oriented x 3. pleasant and cooperative. Notes On evaluation today initially I did not feel that  the discoloration noted that the sites indicated by the patient on her plantar forefoot and her right left first toe were ulcers. However there was the callous overlying both locations. I therefore did perform some callous paring in order to evaluate whether there was a wound underline or not and in both locations there was not. She has no erythema or evidence of infection noted on inspection. She did not appear to be extremely tender to palpation either. Electronic Signature(s) Signed: 03/03/2017 6:13:19 PM By: Worthy Keeler PA-C Entered By: Worthy Keeler on 03/03/2017 08:31:11 Clanton, Idell W. ([id number]) -------------------------------------------------------------------------------- Physician Orders Details Patient Name: Katherine Delgado Date of Service: 03/02/2017 10:30 AM Medical Record Number: [id number] Patient Account Number: [id number] Date of Birth/Sex: 05-Aug-1941 (76 y.o. Female) Treating RN: Carolyne Fiscal, Debi Primary Care Provider: Hollace Kinnier Other Clinician: Referring Provider: Hollace Kinnier Treating Provider/Extender: Melburn Hake, Tranell Wojtkiewicz Weeks in Treatment: 0 Verbal / Phone Orders: Yes Clinician: Carolyne Fiscal, Debi Read Back and Verified: Yes Diagnosis Coding Discharge From [facility] Services o Discharge from [place] offloading shoes and or padding on areas. Do not put pressure on areas. See your podiatrist about getting diabetic shoes. Keep area clean and dry. Call our office if you have any questions or concerns. Notes I'm going to recommend that we just see this patient back for a follow-up visit as needed in the  future if she develops any worsening or new wounds. However right now she appears to be stable and I do not feel her current situation represents any threat to her. Nonetheless I did have several suggestions and recommendations that I made to her today. One of those is that I strongly suggest that she contact podiatry as soon as possible or to be fitted for diabetic shoes to prevent as much as possible future callous development and/or foot ulcers. I also discussed with patient that I would recommend that for the time being she get corn/callous pads over the counter in order to pad the areas that were pared down today to help alleviate any discomfort. Otherwise if she has any other concerns she will contact our office for additional recommendations we will see her back on an as-needed basis in the future. Electronic Signature(s) Signed: 03/03/2017 6:13:19 PM By: Worthy Keeler PA-C Previous Signature: 03/02/2017 4:28:11 PM Version By: Alric Quan Previous Signature: 03/02/2017 5:22:22 PM Version By: Worthy Keeler PA-C Entered By: Worthy Keeler on 03/03/2017 08:33:25 Eisenbeis, Takira W. ([id number]) -------------------------------------------------------------------------------- Problem List Details Patient Name: Katherine Delgado Date of Service: 03/02/2017 10:30 AM Medical Record Number: [id number] Patient Account Number: [id number] Date of Birth/Sex: 1941/07/14 (76 y.o. Female) Treating RN: Carolyne Fiscal, Debi Primary Care Provider: Hollace Kinnier Other Clinician: Referring Provider: Hollace Kinnier Treating Provider/Extender: Melburn Hake, Ladd Cen Weeks in Treatment: 0 Active Problems ICD-10 Encounter Code Description Active Date Diagnosis M79.672 Pain in left foot 03/02/2017 Yes E11.621 Type 2 diabetes mellitus with foot ulcer 03/02/2017 Yes I87.2 Venous insufficiency (chronic) (peripheral) 03/02/2017 Yes M08.00 Unspecified juvenile rheumatoid arthritis of unspecified 03/02/2017 Yes site I10  Essential (primary) hypertension 03/02/2017 Yes Inactive Problems Resolved Problems Electronic Signature(s) Signed: 03/02/2017 5:22:22 PM By: Worthy Keeler PA-C Entered By: Worthy Keeler on 03/02/2017 13:02:11 Sandhu, Sharnita W. ([id number]) -------------------------------------------------------------------------------- Progress Note Details Patient Name: Katherine Delgado Date of Service: 03/02/2017 10:30 AM Medical Record Number: [id number] Patient Account Number: [id number] Date of Birth/Sex: [birth year] (76 y.o. Female) Treating RN: Carolyne Fiscal, Debi Primary Care Provider: Hollace Kinnier Other Clinician: Referring Provider: Hollace Kinnier  Treating Provider/Extender: STONE III, Mehkai Gallo Weeks in Treatment: 0 Subjective Chief Complaint Information obtained from Patient Left foot pain and callus History of Present Illness (HPI) 03/02/17 on presentation today patient is noted to have a history which includes that of depression, peripheral neuropathy, Guillain-Barr syndrome, hypertension, COPD, fibromyalgia, diabetes mellitus type II, Sjogrens syndrome, and rheumatoid arthritis. She is cared for at [facility] by rheumatology. She also has previously been seen in January 2018 most recently by Dr. Sharol Given in regard to a left first toe ulcer. They treated her with doxycycline at that point although following that last evaluation in January she has not been seen by them since. In fact I could not find where anyone has really been caring for her foot and specifically that toe wound since. However she did have an x-ray on 02/27/17 of the left foot which showed no definitive osteomyelitis and just what appeared to be arthritic changes. She does also have diabetes and her hemoglobin A1c most recently which was performed at the end of June 2018 was 8.2. This was higher than her previous evaluation four months prior and definitely higher than her evaluation when you're previous. These were 7.6 and 6.6  respectively. Her main concern today is that of the plantar surface of her left foot and her left great toe where she has regions of discoloration that she feels is an ulcer. She is having discomfort at the site but no evidence of infection locally or systemically. Wound History Patient presents with 2 open wounds that have been present for approximately may 2018. Patient has been treating wounds in the following manner: ABX ointment, peroxide. Laboratory tests have not been performed in the last month. Patient reportedly has not tested positive for an antibiotic resistant organism. Patient reportedly has not tested positive for osteomyelitis. Patient reportedly has had testing performed to evaluate circulation in the legs. Patient History Information obtained from Patient. Allergies sulfamethizole, penicillin Family History Diabetes - Mother, Maternal Grandparents, Heart Disease - Mother, Father, Hypertension - Father, Mother, Thyroid Problems - Siblings, Mother, No family history of Cancer, Hereditary Spherocytosis, Kidney Disease, Lung Disease, Seizures, Stroke, Bawa, Quin W. ([id number]) Tuberculosis. Social History Former smoker - 25 yrs ago, Marital Status - Widowed, Alcohol Use - Never, Drug Use - No History, Caffeine Use - Daily. Medical History Respiratory Patient has history of Chronic Obstructive Pulmonary Disease (COPD) Cardiovascular Patient has history of Hypertension Endocrine Patient has history of Type II Diabetes Musculoskeletal Patient has history of Rheumatoid Arthritis, Osteoarthritis Neurologic Patient has history of Neuropathy Review of Systems (ROS) Constitutional Symptoms ([place]) The patient has no complaints or symptoms. Ear/Nose/Mouth/Throat chronic sinusitis Hematologic/Lymphatic spontaneous eccymoses Cardiovascular hyperlipidemia Gastrointestinal gerd Endocrine Complains or has symptoms of Thyroid disease, hx thymus  tumor Genitourinary Complains or has symptoms of Incontinence/dribbling, chronic urinary infection Musculoskeletal degenerative arthritis fibromyalgia guilliam barre syndrome osteopenia osteoporosis spinal stenosis sponylosis lumbosacral Psychiatric depression Objective Constitutional Koppen, Daisha W. ([id number]) Obese and well-hydrated in no acute distress. Vitals Time Taken: 10:10 AM, Height: 66 in, Source: Stated, Weight: 272.2 lbs, Source: Measured, BMI: 43.9. Eyes conjunctiva clear no eyelid edema noted. pupils equal round and reactive to light and accommodation. Ears, Nose, Mouth, and Throat no gross abnormality of ear auricles or external auditory canals. normal hearing noted during conversation. mucus membranes moist. Respiratory normal breathing without difficulty. clear to auscultation bilaterally. Cardiovascular regular rate and rhythm with normal S1, S2. 2+ dorsalis pedis/posterior tibialis pulses. no clubbing, cyanosis, significant edema, Gastrointestinal (GI) soft, non-tender, non-distended, +BS. no  ventral hernia noted. Musculoskeletal normal gait and posture. no significant deformity or arthritic changes, no loss or range of motion, no clubbing. Psychiatric this patient is able to make decisions and demonstrates good insight into disease process. Alert and Oriented x 3. pleasant and cooperative. General Notes: On evaluation today initially I did not feel that the discoloration noted that the sites indicated by the patient on her plantar forefoot and her right left first toe were ulcers. However there was the callous overlying both locations. I therefore did perform some callous paring in order to evaluate whether there was a wound underline or not and in both locations there was not. She has no erythema or evidence of infection noted on inspection. She did not appear to be extremely tender to palpation either. Other Condition(s) Patient presents with Suspected  Deep Tissue Injury located on the Right medial toe. Assessment Active Problems ICD-10 M79.672 - Pain in left foot E11.621 - Type 2 diabetes mellitus with foot ulcer Hjort, Anett W. ([id number]) I87.2 - Venous insufficiency (chronic) (peripheral) M08.00 - Unspecified juvenile rheumatoid arthritis of unspecified site I10 - Essential (primary) hypertension Plan Discharge From [facility] Services: Discharge from [place] offloading shoes and or padding on areas. Do not put pressure on areas. See your podiatrist about getting diabetic shoes. Keep area clean and dry. Call our office if you have any questions or concerns. General Notes: I'm going to recommend that we just see this patient back for a follow-up visit as needed in the future if she develops any worsening or new wounds. However right now she appears to be stable and I do not feel her current situation represents any threat to her. Nonetheless I did have several suggestions and recommendations that I made to her today. One of those is that I strongly suggest that she contact podiatry as soon as possible or to be fitted for diabetic shoes to prevent as much as possible future callous development and/or foot ulcers. I also discussed with patient that I would recommend that for the time being she get corn/callous pads over the counter in order to pad the areas that were pared down today to help alleviate any discomfort. Otherwise if she has any other concerns she will contact our office for additional recommendations we will see her back on an as-needed basis in the future. Electronic Signature(s) Signed: 03/03/2017 6:13:19 PM By: Worthy Keeler PA-C Entered By: Worthy Keeler on 03/03/2017 08:33:41 Cairns, Amra W. ([id number]) -------------------------------------------------------------------------------- ROS/PFSH Details Patient Name: Katherine Delgado Date of Service: 03/02/2017 10:30 AM Medical Record Number:  [id number] Patient Account Number: [id number] Date of Birth/Sex: [birth year] (76 y.o. Female) Treating RN: Carolyne Fiscal, Debi Primary Care Provider: Hollace Kinnier Other Clinician: Referring Provider: Hollace Kinnier Treating Provider/Extender: Melburn Hake, Job Holtsclaw Weeks in Treatment: 0 Information Obtained From Patient Wound History Do you currently have one or more open woundso Yes How many open wounds do you currently haveo 2 Approximately how long have you had your woundso may 2018 How have you been treating your wound(s) until nowo ABX ointment, peroxide Has your wound(s) ever healed and then re-openedo No Have you had any lab work done in the past montho No Have you tested positive for an antibiotic resistant organism (MRSA, VRE)o No Have you tested positive for osteomyelitis (bone infection)o No Have you had any tests for circulation on your legso Yes Who ordered the testo dr. [facility] cardiology Where was the test doneo 3 months ago Endocrine Complaints and Symptoms:  Positive for: Thyroid disease Review of System Notes: hx thymus tumor Medical History: Positive for: Type II Diabetes Genitourinary Complaints and Symptoms: Positive for: Incontinence/dribbling Review of System Notes: chronic urinary infection Constitutional Symptoms (General Health) Complaints and Symptoms: No Complaints or Symptoms Ear/Nose/Mouth/Throat Complaints and Symptoms: Review of System Notes: Mordan, Corah W. ([id number]) chronic sinusitis Hematologic/Lymphatic Complaints and Symptoms: Review of System Notes: spontaneous eccymoses Respiratory Medical History: Positive for: Chronic Obstructive Pulmonary Disease (COPD) Cardiovascular Complaints and Symptoms: Review of System Notes: hyperlipidemia Medical History: Positive for: Hypertension Gastrointestinal Complaints and Symptoms: Review of System Notes: gerd Musculoskeletal Complaints and Symptoms: Review of System Notes: degenerative  arthritis fibromyalgia guilliam barre syndrome osteopenia osteoporosis spinal stenosis sponylosis lumbosacral Medical History: Positive for: Rheumatoid Arthritis; Osteoarthritis Neurologic Medical History: Positive for: Neuropathy Psychiatric Complaints and Symptoms: Review of System Notes: Ludden, Quinita W. ([id number]) depression Immunizations Pneumococcal Vaccine: Received Pneumococcal Vaccination: Yes Family and Social History Cancer: No; Diabetes: Yes - Mother, Maternal Grandparents; Heart Disease: Yes - Mother, Father; Hereditary Spherocytosis: No; Hypertension: Yes - Father, Mother; Kidney Disease: No; Lung Disease: No; Seizures: No; Stroke: No; Thyroid Problems: Yes - Siblings, Mother; Tuberculosis: No; Former smoker - 25 yrs ago; Marital Status - Widowed; Alcohol Use: Never; Drug Use: No History; Caffeine Use: Daily; Financial Concerns: No; Food, Clothing or Shelter Needs: No; Support System Lacking: No; Transportation Concerns: No; Advanced Directives: No; Patient does not want information on Advanced Directives; Do not resuscitate: No; Living Will: Yes (Not Provided); Medical Power of Attorney: Yes - Ovid Curd and Roderic Palau (sons) (Not Provided) Electronic Signature(s) Signed: 03/02/2017 4:28:11 PM By: Alric Quan Signed: 03/02/2017 5:22:22 PM By: Worthy Keeler PA-C Entered By: Alric Quan on 03/02/2017 10:42:32 Vantassel, Kaysa W. ([id number]) -------------------------------------------------------------------------------- SuperBill Details Patient Name: Katherine Delgado Date of Service: 03/02/2017 Medical Record Number: [id number] Patient Account Number: [id number] Date of Birth/Sex: October 17, 1940 (76 y.o. Female) Treating RN: Carolyne Fiscal, Debi Primary Care Provider: Hollace Kinnier Other Clinician: Referring Provider: Hollace Kinnier Treating Provider/Extender: Melburn Hake, Sarahanne Novakowski Weeks in Treatment: 0 Diagnosis Coding ICD-10 Codes Code Description M79.672 Pain in left  foot E11.621 Type 2 diabetes mellitus with foot ulcer I87.2 Venous insufficiency (chronic) (peripheral) M08.00 Unspecified juvenile rheumatoid arthritis of unspecified site I10 Essential (primary) hypertension Facility Procedures CPT4 Code: [id number] Description: 99214 - WOUND CARE VISIT-LEV 4 EST PT Modifier: Quantity: 1 Physician Procedures CPT4 Code: [id number] Description: WC PHYS LEVEL 3 o NEW PT ICD-10 Description Diagnosis M79.672 Pain in left foot E11.621 Type 2 diabetes mellitus with foot ulcer I87.2 Venous insufficiency (chronic) (peripheral) M08.00 Unspecified juvenile rheumatoid arthritis of uns Modifier: pecified site Quantity: 1 Electronic Signature(s) Signed: 03/03/2017 6:13:19 PM By: Worthy Keeler PA-C Entered By: Worthy Keeler on 03/03/2017 08:34:30

## 2017-03-06 NOTE — Progress Notes (Signed)
Katherine Delgado, Katherine W. ([id number]) Visit Report for 03/02/2017 Allergy List Details Patient Name: Katherine Delgado, Katherine Delgado. Date of Service: 03/02/2017 10:30 AM Medical Record Number: [id number] Patient Account Number: [id number] Date of Birth/Sex: 10-Nov-1940 (76 y.o. Female) Treating RN: Carolyne Fiscal, Debi Primary Care Aasiya Creasey: Hollace Kinnier Other Clinician: Referring Ector Laurel: Hollace Kinnier Treating Larosa Rhines/Extender: STONE III, HOYT Weeks in Treatment: 0 Allergies Active Allergies sulfamethizole penicillin Allergy Notes Electronic Signature(s) Signed: 03/02/2017 4:28:11 PM By: Alric Quan Entered By: Alric Quan on 03/02/2017 11:51:37 Katherine Delgado, Katherine W. ([id number]) -------------------------------------------------------------------------------- Arrival Information Details Patient Name: Katherine Delgado Date of Service: 03/02/2017 10:30 AM Medical Record Number: [id number] Patient Account Number: [id number] Date of Birth/Sex: [birth year] (76 y.o. Female) Treating RN: Carolyne Fiscal, Debi Primary Care Arionne Iams: Hollace Kinnier Other Clinician: Referring Earsel Shouse: Hollace Kinnier Treating Ayumi Wangerin/Extender: Melburn Hake, HOYT Weeks in Treatment: 0 Visit Information Patient Arrived: Ambulatory Arrival Time: 10:08 Accompanied By: self Transfer Assistance: None Patient Identification Verified: Yes Secondary Verification Process Yes Completed: Patient Requires Transmission-Based No Precautions: Patient Has Alerts: Yes Patient Alerts: DM II Electronic Signature(s) Signed: 03/02/2017 4:28:11 PM By: Alric Quan Entered By: Alric Quan on 03/02/2017 11:00:32 Katherine Delgado, Katherine W. ([id number]) -------------------------------------------------------------------------------- Clinic Level of Care Assessment Details Patient Name: Katherine Delgado Date of Service: 03/02/2017 10:30 AM Medical Record Number: [id number] Patient Account Number: [id number] Date of Birth/Sex: 04-Feb-1941 (76 y.o. Female) Treating RN:  Carolyne Fiscal, Debi Primary Care Shiloh Southern: Hollace Kinnier Other Clinician: Referring Tesia Lybrand: Hollace Kinnier Treating Gwyneth Fernandez/Extender: Melburn Hake, HOYT Weeks in Treatment: 0 Clinic Level of Care Assessment Items TOOL 2 Quantity Score X - Use when only an EandM is performed on the INITIAL visit 1 0 ASSESSMENTS - Nursing Assessment / Reassessment X - General Physical Exam (combine w/ comprehensive assessment (listed just 1 20 below) when performed on new pt. evals) X - Comprehensive Assessment (HX, ROS, Risk Assessments, Wounds Hx, etc.) 1 25 ASSESSMENTS - Wound and Skin Assessment / Reassessment []  - Simple Wound Assessment / Reassessment - one wound 0 []  - Complex Wound Assessment / Reassessment - multiple wounds 0 []  - Dermatologic / Skin Assessment (not related to wound area) 0 ASSESSMENTS - Ostomy and/or Continence Assessment and Care []  - Incontinence Assessment and Management 0 []  - Ostomy Care Assessment and Management (repouching, etc.) 0 PROCESS - Coordination of Care []  - Simple Patient / Family Education for ongoing care 0 X - Complex (extensive) Patient / Family Education for ongoing care 1 20 X - Staff obtains Programmer, systems, Records, Test Results / Process Orders 1 10 []  - Staff telephones HHA, Nursing Homes / Clarify orders / etc 0 []  - Routine Transfer to another Facility (non-emergent condition) 0 []  - Routine Hospital Admission (non-emergent condition) 0 X - New Admissions / Biomedical engineer / Ordering NPWT, Apligraf, etc. 1 15 []  - Emergency Hospital Admission (emergent condition) 0 X - Simple Discharge Coordination 1 10 Katherine Delgado, Katherine W. ([id number]) []  - Complex (extensive) Discharge Coordination 0 PROCESS - Special Needs []  - Pediatric / Minor Patient Management 0 []  - Isolation Patient Management 0 []  - Hearing / Language / Visual special needs 0 []  - Assessment of Community assistance (transportation, D/C planning, etc.) 0 []  - Additional assistance / Altered  mentation 0 []  - Support Surface(s) Assessment (bed, cushion, seat, etc.) 0 INTERVENTIONS - Wound Cleansing / Measurement X - Wound Imaging (photographs - any number of wounds) 1 5 []  - Wound Tracing (instead of photographs) 0 []  - Simple Wound Measurement - one wound 0 []  - Complex Wound Measurement - multiple  wounds 0 []  - Simple Wound Cleansing - one wound 0 []  - Complex Wound Cleansing - multiple wounds 0 INTERVENTIONS - Wound Dressings []  - Small Wound Dressing one or multiple wounds 0 []  - Medium Wound Dressing one or multiple wounds 0 []  - Large Wound Dressing one or multiple wounds 0 []  - Application of Medications - injection 0 INTERVENTIONS - Miscellaneous []  - External ear exam 0 []  - Specimen Collection (cultures, biopsies, blood, body fluids, etc.) 0 []  - Specimen(s) / Culture(s) sent or taken to Lab for analysis 0 []  - Patient Transfer (multiple staff / Harrel Lemon Lift / Similar devices) 0 []  - Simple Staple / Suture removal (25 or less) 0 []  - Complex Staple / Suture removal (26 or more) 0 Katherine Delgado, Katherine W. ([id number]) []  - Hypo / Hyperglycemic Management (close monitor of Blood Glucose) 0 X - Ankle / Brachial Index (ABI) - do not check if billed separately 1 15 Has the patient been seen at the hospital within the last three years: Yes Total Score: 120 Level Of Care: New/Established - Level 4 Electronic Signature(s) Signed: 03/02/2017 4:28:11 PM By: Alric Quan Entered By: Alric Quan on 03/02/2017 16:18:42 Katherine Delgado, Katherine W. ([id number]) -------------------------------------------------------------------------------- Encounter Discharge Information Details Patient Name: Katherine Delgado Date of Service: 03/02/2017 10:30 AM Medical Record Number: [id number] Patient Account Number: [id number] Date of Birth/Sex: [birth year] (76 y.o. Female) Treating RN: Carolyne Fiscal, Debi Primary Care Darianny Momon: Hollace Kinnier Other Clinician: Referring Urania Pearlman: Hollace Kinnier Treating  Danisa Kopec/Extender: Melburn Hake, HOYT Weeks in Treatment: 0 Encounter Discharge Information Items Discharge Pain Level: 0 Discharge Condition: Stable Ambulatory Status: Ambulatory Discharge Destination: Home Transportation: Private Auto Accompanied By: self Schedule Follow-up Appointment: No Medication Reconciliation completed and provided to Patient/Care No [place]: Provided on Clinical Summary of Care: 03/02/2017 Form Type Recipient Paper Patient AS Electronic Signature(s) Signed: 03/02/2017 11:05:19 AM By: Ruthine Dose Entered By: Ruthine Dose on 03/02/2017 11:05:19 Katherine Delgado, Katherine W. ([id number]) -------------------------------------------------------------------------------- Lower Extremity Assessment Details Patient Name: Katherine Delgado Date of Service: 03/02/2017 10:30 AM Medical Record Number: [id number] Patient Account Number: [id number] Date of Birth/Sex: 06-01-1941 (76 y.o. Female) Treating RN: Carolyne Fiscal, Debi Primary Care Antonya Leeder: Hollace Kinnier Other Clinician: Referring Gurkirat Basher: Hollace Kinnier Treating Abdo Denault/Extender: Melburn Hake, HOYT Weeks in Treatment: 0 Edema Assessment Assessed: [Left: No] [Right: No] E[Left: dema] [Right: :] Calf Left: Right: Point of Measurement: 33 cm From Medial Instep 42.2 cm cm Ankle Left: Right: Point of Measurement: 10 cm From Medial Instep 27 cm cm Vascular Assessment Pulses: Dorsalis Pedis Palpable: [Left:Yes] Posterior Tibial Extremity colors, hair growth, and conditions: Extremity Color: [Left:Normal] Hair Growth on Extremity: [Left:Yes] Temperature of Extremity: [Left:Warm] Capillary Refill: [Left:< 3 seconds] Blood Pressure: Brachial: [Left:162] Dorsalis Pedis: 120 [Left:Dorsalis Pedis:] Ankle: Posterior Tibial: 140 [Left:Posterior Tibial: 0.86] Toe Nail Assessment Left: Right: Thick: Yes Discolored: Yes Deformed: Yes Improper Length and Hygiene: No Electronic Signature(s) Signed: 03/02/2017 4:28:11 PM By:  Sidonie Dickens, Leimomi W. ([id number]) Entered By: Alric Quan on 03/02/2017 10:26:42 Katherine Delgado, Katherine W. ([id number]) -------------------------------------------------------------------------------- Multi Wound Chart Details Patient Name: Katherine Delgado Date of Service: 03/02/2017 10:30 AM Medical Record Number: [id number] Patient Account Number: [id number] Date of Birth/Sex: [birth year] (76 y.o. Female) Treating RN: Carolyne Fiscal, Debi Primary Care Ayomide Purdy: Hollace Kinnier Other Clinician: Referring Stelios Kirby: Hollace Kinnier Treating Zae Kirtz/Extender: Melburn Hake, HOYT Weeks in Treatment: 0 Wound Assessments Treatment Notes Electronic Signature(s) Signed: 03/02/2017 4:28:11 PM By: Alric Quan Entered By: Alric Quan on 03/02/2017 10:58:07 Katherine Delgado, Katherine W. ([id number]) -------------------------------------------------------------------------------- Multi-Disciplinary Care Plan Details Patient Name:  Katherine Delgado, Katherine W. Date of Service: 03/02/2017 10:30 AM Medical Record Number: [id number] Patient Account Number: [id number] Date of Birth/Sex: 28-Jun-1941 (76 y.o. Female) Treating RN: Carolyne Fiscal, Debi Primary Care Sammie Denner: Hollace Kinnier Other Clinician: Referring Rebecah Dangerfield: Hollace Kinnier Treating Hiliana Eilts/Extender: Melburn Hake, HOYT Weeks in Treatment: 0 Active Inactive Electronic Signature(s) Signed: 03/02/2017 4:28:11 PM By: Alric Quan Entered By: Alric Quan on 03/02/2017 10:57:53 Katherine Delgado, Ryli W. ([id number]) -------------------------------------------------------------------------------- Non-Wound Condition Assessment Details Patient Name: Katherine Delgado Date of Service: 03/02/2017 10:30 AM Medical Record Number: [id number] Patient Account Number: [id number] Date of Birth/Sex: Feb 24, 1941 (76 y.o. Female) Treating RN: Carolyne Fiscal, Debi Primary Care Sohail Capraro: Hollace Kinnier Other Clinician: Referring Mykela Mewborn: Hollace Kinnier Treating Cheryllynn Sarff/Extender: STONE III,  HOYT Weeks in Treatment: 0 Non-Wound Condition: Condition: Suspected Deep Tissue Injury Location: Other: medial toe Side: Right Photos Periwound Skin Texture Texture Color No Abnormalities Noted: No No Abnormalities Noted: No Moisture No Abnormalities Noted: No Electronic Signature(s) Signed: 03/02/2017 4:28:11 PM By: Alric Quan Entered By: Alric Quan on 03/02/2017 11:13:57 Loux, Lyrika W. ([id number]) -------------------------------------------------------------------------------- Pain Assessment Details Patient Name: Katherine Delgado Date of Service: 03/02/2017 10:30 AM Medical Record Number: [id number] Patient Account Number: [id number] Date of Birth/Sex: 1941/07/06 (76 y.o. Female) Treating RN: Carolyne Fiscal, Debi Primary Care Creighton Longley: Hollace Kinnier Other Clinician: Referring Carlyle Achenbach: Hollace Kinnier Treating Sholom Dulude/Extender: Melburn Hake, HOYT Weeks in Treatment: 0 Active Problems Location of Pain Severity and Description of Pain Patient Has Paino No Site Locations Character of Pain Describe the Pain: Shooting, Stabbing Pain Management and Medication Current Pain Management: Electronic Signature(s) Signed: 03/02/2017 4:28:11 PM By: Alric Quan Entered By: Alric Quan on 03/02/2017 11:00:42 Marcoe, Kenleigh W. ([id number]) -------------------------------------------------------------------------------- Patient/Caregiver Education Details Patient Name: Katherine Delgado Date of Service: 03/02/2017 10:30 AM Medical Record Number: [id number] Patient Account Number: [id number] Date of Birth/Gender: [birth year] (76 y.o. Female) Treating RN: Carolyne Fiscal, Debi Primary Care Physician: Hollace Kinnier Other Clinician: Referring Physician: Hollace Kinnier Treating Physician/Extender: Sharalyn Ink in Treatment: 0 Education Assessment Education Provided To: Patient Education Topics Provided Wound/Skin Impairment: Handouts: Other: Call our office if you have any  questions or concerns. Methods: Explain/Verbal Responses: State content correctly Electronic Signature(s) Signed: 03/02/2017 4:28:11 PM By: Alric Quan Entered By: Alric Quan on 03/02/2017 11:00:18 Spang, Feliciana W. ([id number]) -------------------------------------------------------------------------------- [place] Details Patient Name: Katherine Delgado Date of Service: 03/02/2017 10:30 AM Medical Record Number: [id number] Patient Account Number: [id number] Date of Birth/Sex: 11-10-1940 (76 y.o. Female) Treating RN: Carolyne Fiscal, Debi Primary Care Garritt Molyneux: Hollace Kinnier Other Clinician: Referring Nat Lowenthal: Hollace Kinnier Treating Earla Charlie/Extender: Melburn Hake, HOYT Weeks in Treatment: 0 Vital Signs Time Taken: 10:10 Reference Range: 80 - 120 mg / dl Height (in): 66 Source: Stated Weight (lbs): 272.2 Source: Measured Body Mass Index (BMI): 43.9 Electronic Signature(s) Signed: 03/02/2017 4:28:11 PM By: Alric Quan Entered By: Alric Quan on 03/02/2017 10:10:37

## 2017-03-15 ENCOUNTER — Other Ambulatory Visit: Payer: Self-pay | Admitting: *Deleted

## 2017-03-15 DIAGNOSIS — M797 Fibromyalgia: Secondary | ICD-10-CM

## 2017-03-15 DIAGNOSIS — G9332 Myalgic encephalomyelitis/chronic fatigue syndrome: Secondary | ICD-10-CM

## 2017-03-15 DIAGNOSIS — M545 Low back pain: Principal | ICD-10-CM

## 2017-03-15 DIAGNOSIS — R5382 Chronic fatigue, unspecified: Secondary | ICD-10-CM

## 2017-03-15 DIAGNOSIS — G8929 Other chronic pain: Secondary | ICD-10-CM

## 2017-03-15 MED ORDER — HYDROCODONE-ACETAMINOPHEN 10-325 MG PO TABS: ORAL_TABLET | ORAL | 0 refills | Status: DC

## 2017-03-15 MED ORDER — OXYCODONE HCL 5 MG PO TABS: ORAL_TABLET | ORAL | 0 refills | Status: DC

## 2017-03-15 NOTE — Telephone Encounter (Signed)
Patient requested and will pick up 

## 2017-03-17 ENCOUNTER — Other Ambulatory Visit: Payer: Self-pay | Admitting: Internal Medicine

## 2017-03-19 ENCOUNTER — Other Ambulatory Visit: Payer: Self-pay | Admitting: Pharmacotherapy

## 2017-03-21 ENCOUNTER — Telehealth: Payer: Self-pay | Admitting: *Deleted

## 2017-03-21 NOTE — Telephone Encounter (Signed)
Received fax from [place] for patient's Humalog Kwikpen. Prior Authorization initiated through CoverMyMeds Response in 24-48 hours.  Key [id number]

## 2017-03-22 NOTE — Telephone Encounter (Signed)
Received fax from [place] stating medication is on patient's plan list of covered drugs. Prior authorization is not required.  Patient ID: [id number] Reference #: [id number]

## 2017-03-27 NOTE — Progress Notes (Signed)
   Procedure Note  Patient: Katherine Delgado             Date of Birth: 1941/06/10           MRN: [id number]             Visit Date: 03/31/2017  Procedures: Visit Diagnoses: Primary osteoarthritis of both knees Hyalgan #1 bilateral knees   Large Joint Inj Date/Time: 03/31/2017 10:46 AM Performed by: Bo Merino Authorized by: Bo Merino   Consent Given by:  Patient Site marked: the procedure site was marked   Timeout: prior to procedure the correct patient, procedure, and site was verified   Indications:  Pain and joint swelling Location:  Knee Site:  R knee Prep: patient was prepped and draped in usual sterile fashion   Needle Size:  27 G Needle Length:  1.5 inches Approach:  Medial Ultrasound Guidance: No   Fluoroscopic Guidance: No   Arthrogram: No   Medications:  1.5 mL lidocaine 1 %; 20 mg Sodium Hyaluronate 20 MG/2ML Aspiration Attempted: Yes   Aspirate amount (mL):  0 Patient tolerance:  Patient tolerated the procedure well with no immediate complications Large Joint Inj Date/Time: 03/31/2017 10:46 AM Performed by: Bo Merino Authorized by: Bo Merino   Consent Given by:  Patient Site marked: the procedure site was marked   Timeout: prior to procedure the correct patient, procedure, and site was verified   Indications:  Pain and joint swelling Location:  Knee Prep: patient was prepped and draped in usual sterile fashion   Needle Size:  27 G Needle Length:  1.5 inches Approach:  Medial Ultrasound Guidance: No   Fluoroscopic Guidance: No   Arthrogram: No   Medications:  1.5 mL lidocaine 1 %; 20 mg Sodium Hyaluronate 20 MG/2ML Aspiration Attempted: Yes   Aspirate amount (mL):  0 Patient tolerance:  Patient tolerated the procedure well with no immediate complications   Bo Merino, MD

## 2017-03-28 ENCOUNTER — Other Ambulatory Visit: Payer: Self-pay | Admitting: Rheumatology

## 2017-03-28 NOTE — Telephone Encounter (Signed)
Patient states the infection has cleared.

## 2017-03-28 NOTE — Telephone Encounter (Signed)
Last Visit: 01/26/17 Next Visit: 05/02/17 Labs: 02/20/17 WBC 15.7, previously 12.0 AST 39, ALT 32, Previous AST 44, ALT 26  Okay to refill Xeljanz?  

## 2017-03-28 NOTE — Telephone Encounter (Signed)
Please call to discuss with patient if she is still has an infection then she should hold off xeljanz. Otherwise ok to refill.

## 2017-03-30 ENCOUNTER — Other Ambulatory Visit: Payer: Self-pay | Admitting: Internal Medicine

## 2017-03-30 NOTE — Progress Notes (Signed)
   Procedure Note  Patient: Katherine Delgado             Date of Birth: [birth year]           MRN: [id number]             Visit Date: 04/07/2017  Procedures: Visit Diagnoses: Primary osteoarthritis of both knees Hyalgan #2 Bilateral knees Large Joint Inj Date/Time: 04/07/2017 1:06 PM Performed by: Bo Merino Authorized by: Bo Merino   Consent Given by:  Patient Site marked: the procedure site was marked   Timeout: prior to procedure the correct patient, procedure, and site was verified   Indications:  Pain and joint swelling Location:  Knee Site:  R knee Prep: patient was prepped and draped in usual sterile fashion   Needle Size:  27 G Needle Length:  1.5 inches Approach:  Medial Ultrasound Guidance: No   Fluoroscopic Guidance: No   Arthrogram: No   Medications:  1.5 mL lidocaine 1 %; 20 mg Sodium Hyaluronate 20 MG/2ML Aspiration Attempted: Yes   Aspirate amount (mL):  0 Patient tolerance:  Patient tolerated the procedure well with no immediate complications Large Joint Inj Date/Time: 04/07/2017 1:06 PM Performed by: Bo Merino Authorized by: Bo Merino   Consent Given by:  Patient Site marked: the procedure site was marked   Timeout: prior to procedure the correct patient, procedure, and site was verified   Indications:  Pain and joint swelling Location:  Knee Site:  L knee Prep: patient was prepped and draped in usual sterile fashion   Needle Size:  27 G Needle Length:  1.5 inches Approach:  Medial Ultrasound Guidance: No   Fluoroscopic Guidance: No   Arthrogram: No   Medications:  1.5 mL lidocaine 1 %; 20 mg Sodium Hyaluronate 20 MG/2ML Aspiration Attempted: Yes   Patient tolerance:  Patient tolerated the procedure well with no immediate complications   Bo Merino, MD

## 2017-03-31 ENCOUNTER — Ambulatory Visit (INDEPENDENT_AMBULATORY_CARE_PROVIDER_SITE_OTHER): Payer: Medicare Other | Admitting: Rheumatology

## 2017-03-31 DIAGNOSIS — M170 Bilateral primary osteoarthritis of knee: Secondary | ICD-10-CM | POA: Diagnosis not present

## 2017-03-31 MED ORDER — LIDOCAINE HCL 1 % IJ SOLN
1.5000 mL | INTRAMUSCULAR | Status: AC | PRN
  Administered 2017-03-31: 1.5 mL

## 2017-03-31 MED ORDER — SODIUM HYALURONATE (VISCOSUP) 20 MG/2ML IX SOSY
20.0000 mg | PREFILLED_SYRINGE | INTRA_ARTICULAR | Status: AC | PRN
  Administered 2017-03-31: 20 mg via INTRA_ARTICULAR

## 2017-04-07 ENCOUNTER — Ambulatory Visit (INDEPENDENT_AMBULATORY_CARE_PROVIDER_SITE_OTHER): Payer: Medicare Other | Admitting: Rheumatology

## 2017-04-07 DIAGNOSIS — M170 Bilateral primary osteoarthritis of knee: Secondary | ICD-10-CM | POA: Diagnosis not present

## 2017-04-07 MED ORDER — LIDOCAINE HCL 1 % IJ SOLN
1.5000 mL | INTRAMUSCULAR | Status: AC | PRN
  Administered 2017-04-07: 1.5 mL

## 2017-04-07 MED ORDER — SODIUM HYALURONATE (VISCOSUP) 20 MG/2ML IX SOSY
20.0000 mg | PREFILLED_SYRINGE | INTRA_ARTICULAR | Status: AC | PRN
  Administered 2017-04-07: 20 mg via INTRA_ARTICULAR

## 2017-04-10 ENCOUNTER — Other Ambulatory Visit: Payer: Self-pay | Admitting: Internal Medicine

## 2017-04-14 ENCOUNTER — Ambulatory Visit (INDEPENDENT_AMBULATORY_CARE_PROVIDER_SITE_OTHER): Payer: Medicare Other | Admitting: Rheumatology

## 2017-04-14 ENCOUNTER — Other Ambulatory Visit: Payer: Self-pay | Admitting: *Deleted

## 2017-04-14 DIAGNOSIS — G9332 Myalgic encephalomyelitis/chronic fatigue syndrome: Secondary | ICD-10-CM

## 2017-04-14 DIAGNOSIS — M170 Bilateral primary osteoarthritis of knee: Secondary | ICD-10-CM

## 2017-04-14 DIAGNOSIS — G8929 Other chronic pain: Secondary | ICD-10-CM

## 2017-04-14 DIAGNOSIS — M797 Fibromyalgia: Secondary | ICD-10-CM

## 2017-04-14 DIAGNOSIS — M545 Low back pain: Principal | ICD-10-CM

## 2017-04-14 DIAGNOSIS — R5382 Chronic fatigue, unspecified: Secondary | ICD-10-CM

## 2017-04-14 MED ORDER — LIDOCAINE HCL 1 % IJ SOLN
1.5000 mL | INTRAMUSCULAR | Status: AC | PRN
  Administered 2017-04-14: 1.5 mL

## 2017-04-14 MED ORDER — SODIUM HYALURONATE (VISCOSUP) 20 MG/2ML IX SOSY
20.0000 mg | PREFILLED_SYRINGE | INTRA_ARTICULAR | Status: AC | PRN
  Administered 2017-04-14: 20 mg via INTRA_ARTICULAR

## 2017-04-14 MED ORDER — OXYCODONE HCL 5 MG PO TABS: ORAL_TABLET | ORAL | 0 refills | Status: DC

## 2017-04-14 MED ORDER — HYDROCODONE-ACETAMINOPHEN 10-325 MG PO TABS: ORAL_TABLET | ORAL | 0 refills | Status: DC

## 2017-04-14 NOTE — Progress Notes (Signed)
   Procedure Note  Patient: Katherine Delgado             Date of Birth: Sep 21, 1940           MRN: [id number]             Visit Date: 04/14/2017  Procedures: Visit Diagnoses: Primary osteoarthritis of both knees - Plan: Large Joint Injection/Arthrocentesis, Large Joint Injection/Arthrocentesis Hyalgan #3 bilateral knees  Large Joint Inj Date/Time: 04/14/2017 8:25 AM Performed by: Bo Merino Authorized by: Bo Merino   Consent Given by:  Patient Site marked: the procedure site was marked   Timeout: prior to procedure the correct patient, procedure, and site was verified   Indications:  Pain Location:  Knee Site:  R knee Prep: patient was prepped and draped in usual sterile fashion   Needle Size:  27 G Needle Length:  1.5 inches Ultrasound Guidance: No   Fluoroscopic Guidance: No   Arthrogram: No   Medications:  20 mg Sodium Hyaluronate 20 MG/2ML; 1.5 mL lidocaine 1 % Aspiration Attempted: Yes   Patient tolerance:  Patient tolerated the procedure well with no immediate complications Large Joint Inj Date/Time: 04/14/2017 8:25 AM Performed by: Bo Merino Authorized by: Bo Merino   Consent Given by:  Patient Site marked: the procedure site was marked   Timeout: prior to procedure the correct patient, procedure, and site was verified   Indications:  Pain Location:  Knee Site:  L knee Prep: patient was prepped and draped in usual sterile fashion   Needle Size:  27 G Needle Length:  1.5 inches Ultrasound Guidance: No   Fluoroscopic Guidance: No   Arthrogram: No   Medications:  20 mg Sodium Hyaluronate 20 MG/2ML; 1.5 mL lidocaine 1 % Aspiration Attempted: Yes   Patient tolerance:  Patient tolerated the procedure well with no immediate complications   Bo Merino, MD

## 2017-04-14 NOTE — Telephone Encounter (Signed)
Patient requested and will pick up [facility] Database Checked.  

## 2017-04-18 NOTE — Progress Notes (Deleted)
Office Visit Note  Patient: Katherine Delgado             Date of Birth: 16-May-1941           MRN: [id number]             PCP: Gayland Curry, DO Referring: Gayland Curry, DO Visit Date: 04/21/2017 Occupation: @GUAROCC @    Subjective:  No chief complaint on file.   History of Present Illness: Katherine Delgado is a 76 y.o. female ***   Activities of Daily Living:  Patient reports morning stiffness for *** {minute/hour:19697}.   Patient {ACTIONS;DENIES/REPORTS:21021675::"Denies"} nocturnal pain.  Difficulty dressing/grooming: {ACTIONS;DENIES/REPORTS:21021675::"Denies"} Difficulty climbing stairs: {ACTIONS;DENIES/REPORTS:21021675::"Denies"} Difficulty getting out of chair: {ACTIONS;DENIES/REPORTS:21021675::"Denies"} Difficulty using hands for taps, buttons, cutlery, and/or writing: {ACTIONS;DENIES/REPORTS:21021675::"Denies"}   No Rheumatology ROS completed.   PMFS History:  Patient Active Problem List   Diagnosis Date Noted  . Ulcer of toe of left foot, with necrosis of bone ([place]) 07/25/2016  . Rheumatoid arthritis with positive rheumatoid factor ([place]) 06/14/2016  . High risk medication use 06/14/2016  . Sjogren's syndrome ([place]) 06/14/2016  . Primary osteoarthritis of both knees 06/14/2016  . DDD (degenerative disc disease), lumbar 06/14/2016  . Hypothyroidism 06/14/2016  . Osteoporosis 06/14/2016  . Abnormality of gait 04/19/2016  . Type 2 diabetes mellitus with diabetic polyneuropathy, with long-term current use of insulin ([place]) 11/27/2015  . Severe obesity (BMI >= 40) ([place]) 04/21/2014  . Type II or unspecified type diabetes mellitus with peripheral circulatory disorders, uncontrolled(250.72) 09/12/2013  . Headache(784.0) 07/31/2013  . Sinus infection 07/31/2013  . Chronic low back pain 03/14/2013  . Insomnia 12/06/2012  . Nausea alone 12/06/2012  . Diarrhea 12/06/2012  . Urinary incontinence, urge 12/06/2012  . Lumbosacral root lesions, not elsewhere classified  11/27/2012  . Diabetic polyneuropathy associated with type 2 diabetes mellitus ([place]) 11/27/2012  . EDEMA 05/04/2010  . YEAST INFECTION 05/03/2010  . Hyperlipidemia 05/03/2010  . DEPRESSION 05/03/2010  . History of peripheral neuropathy 05/03/2010  . GUILLAIN-BARRE SYNDROME 05/03/2010  . Essential hypertension 05/03/2010  . ALLERGIC RHINITIS 05/03/2010  . PNEUMONIA 05/03/2010  . COPD 05/03/2010  . GERD 05/03/2010  . Fibromyalgia 05/03/2010  . DYSPNEA 05/03/2010  . CHEST PAIN 05/03/2010    Past Medical History:  Diagnosis Date  . Abnormal weight gain   . Abnormality of gait 04/19/2016  . Acute infective polyneuritis ([place]) 2002  . Acute maxillary sinusitis   . Acute sinusitis, unspecified   . Allergic rhinitis due to pollen   . Arthritis   . Back injury   . Candidiasis of skin and nails   . Candidiasis of vulva and vagina   . Chronic pain syndrome   . COPD (chronic obstructive pulmonary disease) ([place])   . Cough   . Degenerative arthritis   . Depressive disorder, not elsewhere classified   . Diabetes mellitus without complication ([place])   . Diaphragmatic hernia without mention of obstruction or gangrene   . Dyslipidemia   . Edema   . Fibromyalgia   . GERD (gastroesophageal reflux disease)   . Guillain-Barre syndrome ([place])   . History of benign thymus tumor   . Hypertension   . Insomnia, unspecified   . Lumbago   . Miscarriage 1962  . Mixed hyperlipidemia   . Morbid obesity ([place])   . Obstructive chronic bronchitis with acute bronchitis ([place])   . Osteopenia, senile   . Osteoporosis   . Other malaise and fatigue   . Other specified disease of white  blood cells   . Pain in joint, multiple sites   . Polyneuropathy in diabetes(357.2)   . RA (rheumatoid arthritis) ([place])   . Reflux esophagitis   . Rheumatoid arthritis(714.0)   . Shortness of breath   . Spinal stenosis, lumbar region, without neurogenic claudication   . Spondylosis, lumbosacral   . Spontaneous ecchymoses    . Stiffness of joints, not elsewhere classified, multiple sites   . Tear film insufficiency, unspecified   . Thyroid disease   . Thyroid disorder   . Type II or unspecified type diabetes mellitus with peripheral circulatory disorders, uncontrolled(250.72)   . Unspecified chronic bronchitis ([place])   . Unspecified essential hypertension   . Unspecified hypothyroidism   . Unspecified pruritic disorder   . Unspecified urinary incontinence   . Urinary tract infection, site not specified     Family History  Problem Relation Age of Onset  . Alzheimer's disease Mother   . Heart disease Mother   . Heart disease Father   . Liver disease Father   . Breast cancer Sister   . Arrhythmia Sister   . Cancer Brother   . Colon polyps Brother   . Colon cancer Neg Hx   . Esophageal cancer Neg Hx   . Kidney disease Neg Hx   . Stomach cancer Neg Hx   . Rectal cancer Neg Hx    Past Surgical History:  Procedure Laterality Date  . ABDOMINAL HYSTERECTOMY  1974  . abdominal tumor  2002  . APPENDECTOMY    . [place]  . BRONCHOSCOPY  2001  . CHOLECYSTECTOMY  1984  . KNEE SURGERY Bilateral 08/27/2010 (L) and 01/11/2011 (R)  . [place]  . OVARIAN CYST SURGERY  1968  . thymus tumor    . 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 TONSILLECTOMY     Social History   Social History Narrative   Walks with cane     Objective: Vital Signs: There were no vitals taken for this visit.   Physical Exam   Musculoskeletal Exam: ***  CDAI Exam: No CDAI exam completed.    Investigation: No additional findings.   Imaging: No results found.  Speciality Comments: No specialty comments available.    Procedures:  No procedures performed Allergies: Penicillins and Sulfamethoxazole-trimethoprim   Assessment / Plan:     Visit Diagnoses: Rheumatoid arthritis involving multiple sites with positive rheumatoid factor (HCC)  High risk medication use - Xeljanz   Age-related osteoporosis without  current pathological fracture  Sjogren's syndrome with keratoconjunctivitis sicca (HCC)  Fibromyalgia  Other fatigue  Other insomnia  Primary osteoarthritis of both knees  DDD (degenerative disc disease), lumbar  History of peripheral neuropathy  History of diabetes mellitus  History of hyperlipidemia  History of hypothyroidism  History of COPD  Abnormality of gait  Severe obesity (BMI >= 40) (HCC)  History of Guillain-Barre syndrome    Orders: No orders of the defined types were placed in this encounter.  No orders of the defined types were placed in this encounter.   Face-to-face time spent with patient was *** minutes. 50% of time was spent in counseling and coordination of care.  Follow-Up Instructions: No Follow-up on file.   Amy Littrell, RT  Note - This record has been created using [employer].  Chart creation errors have been sought, but may not always  have been located. Such creation errors do not reflect on  the standard of medical care.

## 2017-04-18 NOTE — Progress Notes (Signed)
   Procedure Note  Patient: Katherine Delgado             Date of Birth: July 08, 1941           MRN: [id number]             Visit Date: 04/21/2017  Procedures: Visit Diagnoses: Primary osteoarthritis of both knees  Age-related osteoporosis without current pathological fracture Hyalgan #4 Bilateral  Large Joint Inj Date/Time: 04/21/2017 11:17 AM Performed by: Bo Merino Authorized by: Bo Merino   Consent Given by:  Patient Site marked: the procedure site was marked   Timeout: prior to procedure the correct patient, procedure, and site was verified   Indications:  Pain and joint swelling Location:  Knee Site:  R knee Prep: patient was prepped and draped in usual sterile fashion   Needle Size:  27 G Needle Length:  1.5 inches Approach:  Medial Ultrasound Guidance: No   Fluoroscopic Guidance: No   Arthrogram: No   Medications:  1.5 mL lidocaine 1 %; 20 mg Sodium Hyaluronate 20 MG/2ML Aspiration Attempted: Yes   Aspirate amount (mL):  0 Patient tolerance:  Patient tolerated the procedure well with no immediate complications Large Joint Inj Date/Time: 04/21/2017 11:17 AM Performed by: Bo Merino Authorized by: Bo Merino   Consent Given by:  Patient Site marked: the procedure site was marked   Timeout: prior to procedure the correct patient, procedure, and site was verified   Indications:  Pain and joint swelling Location:  Knee Site:  L knee Prep: patient was prepped and draped in usual sterile fashion   Needle Size:  27 G Needle Length:  1.5 inches Approach:  Medial Ultrasound Guidance: No   Fluoroscopic Guidance: No   Arthrogram: No   Medications:  1.5 mL lidocaine 1 %; 20 mg Sodium Hyaluronate 20 MG/2ML Aspiration Attempted: Yes   Aspirate amount (mL):  0 Patient tolerance:  Patient tolerated the procedure well with no immediate complications    Bo Merino, MD

## 2017-04-20 ENCOUNTER — Other Ambulatory Visit: Payer: Self-pay | Admitting: Internal Medicine

## 2017-04-21 ENCOUNTER — Ambulatory Visit (INDEPENDENT_AMBULATORY_CARE_PROVIDER_SITE_OTHER): Payer: Medicare Other | Admitting: Rheumatology

## 2017-04-21 DIAGNOSIS — M810 Age-related osteoporosis without current pathological fracture: Secondary | ICD-10-CM

## 2017-04-21 DIAGNOSIS — M170 Bilateral primary osteoarthritis of knee: Secondary | ICD-10-CM | POA: Diagnosis not present

## 2017-04-21 MED ORDER — LIDOCAINE HCL 1 % IJ SOLN
1.5000 mL | INTRAMUSCULAR | Status: AC | PRN
  Administered 2017-04-21: 1.5 mL

## 2017-04-21 MED ORDER — SODIUM HYALURONATE (VISCOSUP) 20 MG/2ML IX SOSY
20.0000 mg | PREFILLED_SYRINGE | INTRA_ARTICULAR | Status: AC | PRN
  Administered 2017-04-21: 20 mg via INTRA_ARTICULAR

## 2017-04-21 NOTE — Progress Notes (Signed)
Office Visit Note  Patient: Katherine Delgado             Date of Birth: 05-17-1941           MRN: [id number]             PCP: Gayland Curry, DO Referring: Gayland Curry, DO Visit Date: 04/27/2017 Occupation: @GUAROCC @    Subjective:  Injections (Hyalgan #5 Bilateral knees) and Results (DEXA )   History of Present Illness: Katherine Delgado is a 76 y.o. female with history of sero positive rheumatoid arthritis and Sjogren's. She continues to have some sicca symptoms. Her joints are stiff but no joint swelling.   Activities of Daily Living:  Patient reports morning stiffness for all day.   Patient Reports nocturnal pain.  Difficulty dressing/grooming: Reports Difficulty climbing stairs: Reports Difficulty getting out of chair: Reports Difficulty using hands for taps, buttons, cutlery, and/or writing: Denies   Review of Systems  Constitutional: Positive for weight gain. Negative for fatigue, night sweats, weight loss and weakness.       Patient states 10 lb weight gain due to fluid  HENT: Positive for mouth dryness. Negative for mouth sores, trouble swallowing, trouble swallowing and nose dryness.   Eyes: Positive for dryness. Negative for pain, redness and visual disturbance.  Respiratory: Negative for cough, shortness of breath and difficulty breathing.   Cardiovascular: Negative.  Negative for chest pain, palpitations, hypertension, irregular heartbeat and swelling in legs/feet.  Gastrointestinal: Negative.  Negative for blood in stool, constipation and diarrhea.  Endocrine: Negative.  Negative for increased urination.  Genitourinary: Positive for nocturia. Negative for vaginal dryness.       Increased urination   Musculoskeletal: Positive for arthralgias, gait problem, joint pain, myalgias, morning stiffness and myalgias. Negative for joint swelling, muscle weakness and muscle tenderness.  Skin: Positive for hair loss. Negative for color change, rash, skin tightness, ulcers and  sensitivity to sunlight.  Allergic/Immunologic: Positive for susceptible to infections.  Neurological: Negative for dizziness, memory loss and night sweats.       Negative  Hematological: Negative.  Negative for swollen glands.  Psychiatric/Behavioral: Negative.  Negative for depressed mood and sleep disturbance. The patient is not nervous/anxious.     PMFS History:  Patient Active Problem List   Diagnosis Date Noted  . Ulcer of toe of left foot, with necrosis of bone ([place]) 07/25/2016  . Rheumatoid arthritis with positive rheumatoid factor ([place]) 06/14/2016  . High risk medication use 06/14/2016  . Sjogren's syndrome ([place]) 06/14/2016  . Primary osteoarthritis of both knees 06/14/2016  . DDD (degenerative disc disease), lumbar 06/14/2016  . Hypothyroidism 06/14/2016  . Osteoporosis 06/14/2016  . Abnormality of gait 04/19/2016  . Type 2 diabetes mellitus with diabetic polyneuropathy, with long-term current use of insulin ([place]) 11/27/2015  . Severe obesity (BMI >= 40) ([place]) 04/21/2014  . Type II or unspecified type diabetes mellitus with peripheral circulatory disorders, uncontrolled(250.72) 09/12/2013  . Headache(784.0) 07/31/2013  . Sinus infection 07/31/2013  . Chronic low back pain 03/14/2013  . Insomnia 12/06/2012  . Nausea alone 12/06/2012  . Diarrhea 12/06/2012  . Urinary incontinence, urge 12/06/2012  . Lumbosacral root lesions, not elsewhere classified 11/27/2012  . Diabetic polyneuropathy associated with type 2 diabetes mellitus ([place]) 11/27/2012  . EDEMA 05/04/2010  . YEAST INFECTION 05/03/2010  . Hyperlipidemia 05/03/2010  . DEPRESSION 05/03/2010  . History of peripheral neuropathy 05/03/2010  . GUILLAIN-BARRE SYNDROME 05/03/2010  . Essential hypertension 05/03/2010  . ALLERGIC RHINITIS 05/03/2010  .  PNEUMONIA 05/03/2010  . COPD 05/03/2010  . GERD 05/03/2010  . Fibromyalgia 05/03/2010  . DYSPNEA 05/03/2010  . CHEST PAIN 05/03/2010    Past Medical History:    Diagnosis Date  . Abnormal weight gain   . Abnormality of gait 04/19/2016  . Acute infective polyneuritis ([place]) 2002  . Acute maxillary sinusitis   . Acute sinusitis, unspecified   . Allergic rhinitis due to pollen   . Arthritis   . Back injury   . Candidiasis of skin and nails   . Candidiasis of vulva and vagina   . Chronic pain syndrome   . COPD (chronic obstructive pulmonary disease) ([place])   . Cough   . Degenerative arthritis   . Depressive disorder, not elsewhere classified   . Diabetes mellitus without complication ([place])   . Diaphragmatic hernia without mention of obstruction or gangrene   . Dyslipidemia   . Edema   . Fibromyalgia   . GERD (gastroesophageal reflux disease)   . Guillain-Barre syndrome ([place])   . History of benign thymus tumor   . Hypertension   . Insomnia, unspecified   . Lumbago   . Miscarriage 1962  . Mixed hyperlipidemia   . Morbid obesity ([place])   . Obstructive chronic bronchitis with acute bronchitis ([place])   . Osteopenia, senile   . Osteoporosis   . Other malaise and fatigue   . Other specified disease of white blood cells   . Pain in joint, multiple sites   . Polyneuropathy in diabetes(357.2)   . RA (rheumatoid arthritis) ([place])   . Reflux esophagitis   . Rheumatoid arthritis(714.0)   . Shortness of breath   . Spinal stenosis, lumbar region, without neurogenic claudication   . Spondylosis, lumbosacral   . Spontaneous ecchymoses   . Stiffness of joints, not elsewhere classified, multiple sites   . Tear film insufficiency, unspecified   . Thyroid disease   . Thyroid disorder   . Type II or unspecified type diabetes mellitus with peripheral circulatory disorders, uncontrolled(250.72)   . Unspecified chronic bronchitis ([place])   . Unspecified essential hypertension   . Unspecified hypothyroidism   . Unspecified pruritic disorder   . Unspecified urinary incontinence   . Urinary tract infection, site not specified     Family History  Problem  Relation Age of Onset  . Alzheimer's disease Mother   . Heart disease Mother   . Heart disease Father   . Liver disease Father   . Breast cancer Sister   . Arrhythmia Sister   . Cancer Brother   . Colon polyps Brother   . Colon cancer Neg Hx   . Esophageal cancer Neg Hx   . Kidney disease Neg Hx   . Stomach cancer Neg Hx   . Rectal cancer Neg Hx    Past Surgical History:  Procedure Laterality Date  . ABDOMINAL HYSTERECTOMY  1974  . abdominal tumor  2002  . APPENDECTOMY    . [place]  . BRONCHOSCOPY  2001  . CHOLECYSTECTOMY  1984  . KNEE SURGERY Bilateral 08/27/2010 (L) and 01/11/2011 (R)  . [place]  . OVARIAN CYST SURGERY  1968  . thymus tumor    . 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 TONSILLECTOMY     Social History   Social History Narrative   Walks with cane     Objective: Vital Signs: BP 134/70   Pulse 84   Resp 16   Ht 5\' 6"  (1.676 m)   Wt  274 lb (124.3 kg)   BMI 44.22 kg/m    Physical Exam  Constitutional: She is oriented to person, place, and time. She appears well-developed and well-nourished.  HENT:  Head: Normocephalic and atraumatic.  Eyes: Conjunctivae and EOM are normal.  Neck: Normal range of motion.  Cardiovascular: Normal rate, regular rhythm, normal heart sounds and intact distal pulses.   Pulmonary/Chest: Effort normal and breath sounds normal.  Abdominal: Soft. Bowel sounds are normal.  Lymphadenopathy:    She has no cervical adenopathy.  Neurological: She is alert and oriented to person, place, and time.  Skin: Skin is warm and dry. Capillary refill takes less than 2 seconds.  Psychiatric: She has a normal mood and affect. Her behavior is normal.  Nursing note and vitals reviewed.    Musculoskeletal Exam: C-spine good range of motion. She she has thoracic kyphosis some limitation in range of motion of her lumbar spine. Shoulder joints elbow joints with good range of motion. Her left wrist joint is fused with no synovitis. No  synovitis noted over her MCP joints. She has some DIP and PIP thickening in her hands. Hip joints, knee joints are good range of motion she is some crepitus in her knee joints. Fibromyalgia tender points were 6 out of 18 positive.  CDAI Exam: CDAI Homunculus Exam:   Joint Counts:  CDAI Tender Joint count: 0 CDAI Swollen Joint count: 0  Global Assessments:  Patient Global Assessment: 4 Provider Global Assessment: 3  CDAI Calculated Score: 7    Investigation: No additional findings. CBC Latest Ref Rng & Units 02/20/2017 12/29/2016 07/18/2016  WBC 3.8 - 10.8 K/uL 15.7(H) 12.0(H) 21.1(H)  Hemoglobin 11.7 - 15.5 g/dL 14.2 13.5 14.3  Hematocrit 35.0 - 45.0 % 43.9 41.5 44.8  Platelets 140 - 400 K/uL 321 308 364   CMP Latest Ref Rng & Units 02/20/2017 12/29/2016 10/06/2016  Glucose 65 - 99 mg/dL 146(H) 188(H) 178(H)  BUN 7 - 25 mg/dL 16 18 17   Creatinine 0.60 - 0.93 mg/dL 0.81 0.93 0.80  Sodium 135 - 146 mmol/L 139 139 139  Potassium 3.5 - 5.3 mmol/L 4.3 4.2 4.5  Chloride 98 - 110 mmol/L 99 100 101  CO2 20 - 31 mmol/L 28 24 27   Calcium 8.6 - 10.4 mg/dL 9.6 9.5 9.4  Total Protein 6.1 - 8.1 g/dL 7.0 6.6 6.7  Total Bilirubin 0.2 - 1.2 mg/dL 0.5 0.4 0.4  Alkaline Phos 33 - 130 U/L 113 89 100  AST 10 - 35 U/L 39(H) 44(H) 37(H)  ALT 6 - 29 U/L 32(H) 26 33(H)    Imaging: No results found.  Speciality Comments: No specialty comments available.    Procedures:  Large Joint Inj Date/Time: 04/27/2017 10:01 AM Performed by: Bo Merino Authorized by: Bo Merino   Consent Given by:  Patient Site marked: the procedure site was marked   Timeout: prior to procedure the correct patient, procedure, and site was verified   Indications:  Pain Location:  Knee Site:  R knee Prep: patient was prepped and draped in usual sterile fashion   Needle Size:  27 G Needle Length:  1.5 inches Ultrasound Guidance: No   Fluoroscopic Guidance: No   Arthrogram: No   Medications:  1.5 mL  lidocaine 1 %; 20 mg Sodium Hyaluronate 20 MG/2ML Aspiration Attempted: Yes   Patient tolerance:  Patient tolerated the procedure well with no immediate complications Large Joint Inj Date/Time: 04/27/2017 10:01 AM Performed by: Bo Merino Authorized by: Bo Merino   Consent  Given by:  Patient Site marked: the procedure site was marked   Timeout: prior to procedure the correct patient, procedure, and site was verified   Indications:  Pain Location:  Knee Site:  L knee Prep: patient was prepped and draped in usual sterile fashion   Needle Size:  27 G Needle Length:  1.5 inches Ultrasound Guidance: No   Fluoroscopic Guidance: No   Arthrogram: No   Medications:  1.5 mL lidocaine 1 %; 20 mg Sodium Hyaluronate 20 MG/2ML Aspiration Attempted: Yes   Patient tolerance:  Patient tolerated the procedure well with no immediate complications   Allergies: Penicillins and Sulfamethoxazole-trimethoprim   Assessment / Plan:     Visit Diagnoses: Rheumatoid arthritis involving multiple sites with positive rheumatoid factor ([place]): She has no synovitis on examination today. She appears to be doing quite well on [place].  Sjogren's syndrome with keratoconjunctivitis sicca ([place]): Over-the-counter products has been helpful.  High risk medication use - Xeljanx 5 mg by mouth twice a day. She's been tolerating medication well with no synovitis. I'll check her labs today along with vitamin D. She will need a standing orders every 3 months after this.  Primary osteoarthritis of both knees - Plan: Large Joint Injection/Arthrocentesis, Large Joint Injection/Arthrocentesis. She had her fifth L injection to bilateral knee joints today which is described above.  Age-related osteoporosis without current pathological fracture - DEXA 08/2015 ordered by Dr Estanislado Pandy -3.3  T score . Detailed counseling regarding osteoporosis was provided. A handout on Fosamax was given during the last visit she is in  agreement to proceed with that. We'll start her on Fosamax 70 mg by mouth every week. She will need to repeat bone density in 2 years.  Fibromyalgia: She continues to have some generalized pain.  History of peripheral neuropathy  Other insomnia    Orders: Orders Placed This Encounter  Procedures  . Large Joint Injection/Arthrocentesis  . Large Joint Injection/Arthrocentesis  . CBC with Differential/Platelet  . COMPLETE METABOLIC PANEL WITH GFR  . VITAMIN D 25 Hydroxy (Vit-D Deficiency, Fractures)   Meds ordered this encounter  Medications  . alendronate (FOSAMAX) 70 MG tablet    Sig: Take 1 tablet (70 mg total) by mouth once a week. Take with a full glass of water on an empty stomach.    Dispense:  12 tablet    Refill:  0    Face-to-face time spent with patient was  minutes. Greater than 50% of time was spent in counseling and coordination of care.  Follow-Up Instructions: Return in about 5 months (around 09/27/2017) for Rheumatoid arthritis, Osteoporosis.   Bo Merino, MD  Note - This record has been created using [employer].  Chart creation errors have been sought, but may not always  have been located. Such creation errors do not reflect on  the standard of medical care.

## 2017-04-21 NOTE — Patient Instructions (Signed)
Alendronate tablets  What is this medicine?  ALENDRONATE (a LEN droe nate) slows calcium loss from bones. It helps to make normal healthy bone and to slow bone loss in people with Paget's disease and osteoporosis. It may be used in others at risk for bone loss.  This medicine may be used for other purposes; ask your health care provider or pharmacist if you have questions.  COMMON BRAND NAME(S): Fosamax  What should I tell my health care provider before I take this medicine?  They need to know if you have any of these conditions:  -dental disease  -esophagus, stomach, or intestine problems, like acid reflux or GERD  -kidney disease  -low blood calcium  -low vitamin D  -problems sitting or standing 30 minutes  -trouble swallowing  -an unusual or allergic reaction to alendronate, other medicines, foods, dyes, or preservatives  -pregnant or trying to get pregnant  -breast-feeding  How should I use this medicine?  You must take this medicine exactly as directed or you will lower the amount of the medicine you absorb into your body or you may cause yourself harm. Take this medicine by mouth first thing in the morning, after you are up for the day. Do not eat or drink anything before you take your medicine. Swallow the tablet with a full glass (6 to 8 fluid ounces) of plain water. Do not take this medicine with any other drink. Do not chew or crush the tablet. After taking this medicine, do not eat breakfast, drink, or take any medicines or vitamins for at least 30 minutes. Sit or stand up for at least 30 minutes after you take this medicine; do not lie down. Do not take your medicine more often than directed.  Talk to your pediatrician regarding the use of this medicine in children. Special care may be needed.  Overdosage: If you think you have taken too much of this medicine contact a poison control center or emergency room at once.  NOTE: This medicine is only for you. Do not share this medicine with others.  What if I  miss a dose?  If you miss a dose, do not take it later in the day. Continue your normal schedule starting the next morning. Do not take double or extra doses.  What may interact with this medicine?  -aluminum hydroxide  -antacids  -aspirin  -calcium supplements  -drugs for inflammation like ibuprofen, naproxen, and others  -iron supplements  -magnesium supplements  -vitamins with minerals  This list may not describe all possible interactions. Give your health care provider a list of all the medicines, herbs, non-prescription drugs, or dietary supplements you use. Also tell them if you smoke, drink alcohol, or use illegal drugs. Some items may interact with your medicine.  What should I watch for while using this medicine?  Visit your doctor or health care professional for regular checks ups. It may be some time before you see benefit from this medicine. Do not stop taking your medicine except on your doctor's advice. Your doctor or health care professional may order blood tests and other tests to see how you are doing.  You should make sure you get enough calcium and vitamin D while you are taking this medicine, unless your doctor tells you not to. Discuss the foods you eat and the vitamins you take with your health care professional.  Some people who take this medicine have severe bone, joint, and/or muscle pain. This medicine may also increase your risk   for a broken thigh bone. Tell your doctor right away if you have pain in your upper leg or groin. Tell your doctor if you have any pain that does not go away or that gets worse.  This medicine can make you more sensitive to the sun. If you get a rash while taking this medicine, sunlight may cause the rash to get worse. Keep out of the sun. If you cannot avoid being in the sun, wear protective clothing and use sunscreen. Do not use sun lamps or tanning beds/booths.  What side effects may I notice from receiving this medicine?  Side effects that you should report to  your doctor or health care professional as soon as possible:  -allergic reactions like skin rash, itching or hives, swelling of the face, lips, or tongue  -black or tarry stools  -bone, muscle or joint pain  -changes in vision  -chest pain  -heartburn or stomach pain  -jaw pain, especially after dental work  -pain or trouble when swallowing  -redness, blistering, peeling or loosening of the skin, including inside the mouth  Side effects that usually do not require medical attention (report to your doctor or health care professional if they continue or are bothersome):  -changes in taste  -diarrhea or constipation  -eye pain or itching  -headache  -nausea or vomiting  -stomach gas or fullness  This list may not describe all possible side effects. Call your doctor for medical advice about side effects. You may report side effects to FDA at 1-800-FDA-1088.  Where should I keep my medicine?  Keep out of the reach of children.  Store at room temperature of 15 and 30 degrees C (59 and 86 degrees F). Throw away any unused medicine after the expiration date.  NOTE: This sheet is a summary. It may not cover all possible information. If you have questions about this medicine, talk to your doctor, pharmacist, or health care provider.   2018 Elsevier/Gold Standard (2011-02-04 08:56:09)

## 2017-04-24 ENCOUNTER — Other Ambulatory Visit: Payer: Self-pay | Admitting: Rheumatology

## 2017-04-25 NOTE — Telephone Encounter (Signed)
WBC elevated due to prednisone use. Okay to refill Morrie Sheldon.

## 2017-04-25 NOTE — Telephone Encounter (Signed)
Last Visit: 01/26/17 Next Visit: 05/02/17 Labs: 02/20/17 WBC 15.7, previously 12.0 AST 39, ALT 32, Previous AST 44, ALT 26  Okay to refill Morrie Sheldon?

## 2017-04-27 ENCOUNTER — Other Ambulatory Visit: Payer: Self-pay | Admitting: Rheumatology

## 2017-04-27 ENCOUNTER — Other Ambulatory Visit: Payer: Self-pay | Admitting: Internal Medicine

## 2017-04-27 ENCOUNTER — Encounter: Payer: Self-pay | Admitting: Rheumatology

## 2017-04-27 ENCOUNTER — Ambulatory Visit (INDEPENDENT_AMBULATORY_CARE_PROVIDER_SITE_OTHER): Payer: Medicare Other | Admitting: Rheumatology

## 2017-04-27 VITALS — BP 134/70 | HR 84 | Resp 16 | Ht 66.0 in | Wt 274.0 lb

## 2017-04-27 DIAGNOSIS — G4709 Other insomnia: Secondary | ICD-10-CM

## 2017-04-27 DIAGNOSIS — M810 Age-related osteoporosis without current pathological fracture: Secondary | ICD-10-CM

## 2017-04-27 DIAGNOSIS — Z8669 Personal history of other diseases of the nervous system and sense organs: Secondary | ICD-10-CM

## 2017-04-27 DIAGNOSIS — M170 Bilateral primary osteoarthritis of knee: Secondary | ICD-10-CM | POA: Diagnosis not present

## 2017-04-27 DIAGNOSIS — Z79899 Other long term (current) drug therapy: Secondary | ICD-10-CM

## 2017-04-27 DIAGNOSIS — M3501 Sicca syndrome with keratoconjunctivitis: Secondary | ICD-10-CM

## 2017-04-27 DIAGNOSIS — M0579 Rheumatoid arthritis with rheumatoid factor of multiple sites without organ or systems involvement: Secondary | ICD-10-CM

## 2017-04-27 DIAGNOSIS — M797 Fibromyalgia: Secondary | ICD-10-CM

## 2017-04-27 MED ORDER — LIDOCAINE HCL 1 % IJ SOLN
1.5000 mL | INTRAMUSCULAR | Status: AC | PRN
  Administered 2017-04-27: 1.5 mL

## 2017-04-27 MED ORDER — ALENDRONATE SODIUM 70 MG PO TABS: 70.0000 mg | ORAL_TABLET | ORAL | 0 refills | Status: DC

## 2017-04-27 MED ORDER — SODIUM HYALURONATE (VISCOSUP) 20 MG/2ML IX SOSY
20.0000 mg | PREFILLED_SYRINGE | INTRA_ARTICULAR | Status: AC | PRN
  Administered 2017-04-27: 20 mg via INTRA_ARTICULAR

## 2017-04-27 NOTE — Patient Instructions (Signed)
Standing Labs We placed an order today for your standing lab work.    Please come back and get your standing labs in December and every 3 months  We have open lab Monday through Friday from 8:30-11:30 AM and 1:30-4 PM at the office of Dr. Bo Merino.   The office is located at [address], [place], [place], [place] [address] No appointment is necessary.   Labs are drawn by [facility].  You may receive a bill from [facility] for your lab work. If you have any questions regarding directions or hours of operation,  please call [phone].     Please discuss Shingrix vaccine with her PCP. You cannot take any live vaccines.

## 2017-04-28 LAB — CBC WITH DIFFERENTIAL/PLATELET
Basophils Absolute: 110 cells/uL (ref 0–200)
Basophils Relative: 0.8 %
EOS PCT: 3.2 %
Eosinophils Absolute: 442 cells/uL (ref 15–500)
HEMATOCRIT: 43.2 % (ref 35.0–45.0)
HEMOGLOBIN: 14.1 g/dL (ref 11.7–15.5)
LYMPHS ABS: 1932 {cells}/uL (ref 850–3900)
MCH: 27.6 pg (ref 27.0–33.0)
MCHC: 32.6 g/dL (ref 32.0–36.0)
MCV: 84.5 fL (ref 80.0–100.0)
MPV: 11.2 fL (ref 7.5–12.5)
Monocytes Relative: 7.9 %
NEUTROS ABS: 10226 {cells}/uL — AB (ref 1500–7800)
Neutrophils Relative %: 74.1 %
Platelets: 302 10*3/uL (ref 140–400)
RBC: 5.11 10*6/uL — ABNORMAL HIGH (ref 3.80–5.10)
RDW: 13.4 % (ref 11.0–15.0)
Total Lymphocyte: 14 %
WBC mixed population: 1090 cells/uL — ABNORMAL HIGH (ref 200–950)
WBC: 13.8 10*3/uL — AB (ref 3.8–10.8)

## 2017-04-28 LAB — COMPLETE METABOLIC PANEL WITH GFR
AG Ratio: 1.4 (calc) (ref 1.0–2.5)
ALBUMIN MSPROF: 4 g/dL (ref 3.6–5.1)
ALKALINE PHOSPHATASE (APISO): 114 U/L (ref 33–130)
ALT: 28 U/L (ref 6–29)
AST: 44 U/L — ABNORMAL HIGH (ref 10–35)
BILIRUBIN TOTAL: 0.4 mg/dL (ref 0.2–1.2)
BUN: 17 mg/dL (ref 7–25)
CHLORIDE: 101 mmol/L (ref 98–110)
CO2: 31 mmol/L (ref 20–32)
Calcium: 9.5 mg/dL (ref 8.6–10.4)
Creat: 0.91 mg/dL (ref 0.60–0.93)
GFR, Est African American: 71 mL/min/{1.73_m2} (ref >=60)
GFR, Est Non African American: 61 mL/min/{1.73_m2} (ref >=60)
GLOBULIN: 2.8 g/dL (ref 1.9–3.7)
Glucose, Bld: 144 mg/dL — ABNORMAL HIGH (ref 65–99)
Potassium: 4.4 mmol/L (ref 3.5–5.3)
Sodium: 141 mmol/L (ref 135–146)
Total Protein: 6.8 g/dL (ref 6.1–8.1)

## 2017-04-28 LAB — VITAMIN D 25 HYDROXY (VIT D DEFICIENCY, FRACTURES): VIT D 25 HYDROXY: 20 ng/mL — AB (ref 30–100)

## 2017-04-28 NOTE — Progress Notes (Signed)
Ok to give Vit D

## 2017-05-01 ENCOUNTER — Telehealth: Payer: Self-pay | Admitting: Radiology

## 2017-05-01 DIAGNOSIS — E559 Vitamin D deficiency, unspecified: Secondary | ICD-10-CM

## 2017-05-01 DIAGNOSIS — R7989 Other specified abnormal findings of blood chemistry: Secondary | ICD-10-CM

## 2017-05-01 MED ORDER — VITAMIN D3 1.25 MG (50000 UT) PO CAPS: 50000.0000 [IU] | ORAL_CAPSULE | ORAL | 0 refills | Status: AC

## 2017-05-01 NOTE — Telephone Encounter (Signed)
-----   Message from Bo Merino, MD sent at 04/28/2017  2:19 PM EDT ----- Ok to give Vit D

## 2017-05-01 NOTE — Telephone Encounter (Signed)
Called pt to advise of low vit D  sent in Rx ordered lab in 3 month

## 2017-05-02 ENCOUNTER — Ambulatory Visit: Payer: Medicare Other | Admitting: Rheumatology

## 2017-05-06 ENCOUNTER — Other Ambulatory Visit: Payer: Self-pay | Admitting: Internal Medicine

## 2017-05-10 ENCOUNTER — Telehealth: Payer: Self-pay

## 2017-05-10 ENCOUNTER — Other Ambulatory Visit: Payer: Self-pay | Admitting: Internal Medicine

## 2017-05-10 NOTE — Telephone Encounter (Signed)
Received a fax from [employer] regarding a prior authorization Katherine Delgado for Vitamin D3 50,000 UNT. Over the counter or non-prescription drugs are excluded under Medicare rules.   Reference number: [id number] Phone number:[phone]  Spoke to pt to give her an update. Patient voiced understanding and denied any questions at this time.  Will send document to scan center.  [name], [place], CPhT 11:54 AM

## 2017-05-12 ENCOUNTER — Other Ambulatory Visit: Payer: Self-pay | Admitting: *Deleted

## 2017-05-12 DIAGNOSIS — G8929 Other chronic pain: Secondary | ICD-10-CM

## 2017-05-12 DIAGNOSIS — M545 Low back pain: Principal | ICD-10-CM

## 2017-05-12 DIAGNOSIS — G9332 Myalgic encephalomyelitis/chronic fatigue syndrome: Secondary | ICD-10-CM

## 2017-05-12 DIAGNOSIS — M797 Fibromyalgia: Secondary | ICD-10-CM

## 2017-05-12 DIAGNOSIS — R5382 Chronic fatigue, unspecified: Secondary | ICD-10-CM

## 2017-05-12 MED ORDER — OXYCODONE HCL 5 MG PO TABS: ORAL_TABLET | ORAL | 0 refills | Status: DC

## 2017-05-12 MED ORDER — HYDROCODONE-ACETAMINOPHEN 10-325 MG PO TABS: ORAL_TABLET | ORAL | 0 refills | Status: DC

## 2017-05-12 NOTE — Telephone Encounter (Signed)
Patient requested and will pick up [place] not checked due to new process not set up

## 2017-05-13 ENCOUNTER — Other Ambulatory Visit: Payer: Self-pay | Admitting: Internal Medicine

## 2017-05-25 ENCOUNTER — Other Ambulatory Visit: Payer: Self-pay | Admitting: Rheumatology

## 2017-05-26 NOTE — Telephone Encounter (Signed)
Last Visit: 01/26/17 Next Visit: 05/02/17 Labs: 04/27/17 WBC c/w previous   Okay to refill per Dr. Estanislado Pandy

## 2017-05-29 ENCOUNTER — Other Ambulatory Visit: Payer: Self-pay | Admitting: Internal Medicine

## 2017-05-30 NOTE — Telephone Encounter (Signed)
Refill already done 05/08/2017

## 2017-06-01 ENCOUNTER — Encounter: Payer: Self-pay | Admitting: Internal Medicine

## 2017-06-01 ENCOUNTER — Ambulatory Visit (INDEPENDENT_AMBULATORY_CARE_PROVIDER_SITE_OTHER): Payer: Medicare Other | Admitting: Internal Medicine

## 2017-06-01 VITALS — BP 158/70 | HR 96 | Temp 98.0°F | Wt 272.0 lb

## 2017-06-01 DIAGNOSIS — E1142 Type 2 diabetes mellitus with diabetic polyneuropathy: Secondary | ICD-10-CM

## 2017-06-01 DIAGNOSIS — B370 Candidal stomatitis: Secondary | ICD-10-CM

## 2017-06-01 DIAGNOSIS — E039 Hypothyroidism, unspecified: Secondary | ICD-10-CM | POA: Diagnosis not present

## 2017-06-01 DIAGNOSIS — M0579 Rheumatoid arthritis with rheumatoid factor of multiple sites without organ or systems involvement: Secondary | ICD-10-CM

## 2017-06-01 DIAGNOSIS — Z794 Long term (current) use of insulin: Secondary | ICD-10-CM | POA: Diagnosis not present

## 2017-06-01 DIAGNOSIS — M810 Age-related osteoporosis without current pathological fracture: Secondary | ICD-10-CM | POA: Diagnosis not present

## 2017-06-01 DIAGNOSIS — J42 Unspecified chronic bronchitis: Secondary | ICD-10-CM

## 2017-06-01 DIAGNOSIS — I10 Essential (primary) hypertension: Secondary | ICD-10-CM | POA: Diagnosis not present

## 2017-06-01 DIAGNOSIS — N3281 Overactive bladder: Secondary | ICD-10-CM | POA: Diagnosis not present

## 2017-06-01 MED ORDER — UMECLIDINIUM-VILANTEROL 62.5-25 MCG/INH IN AEPB: 1.0000 | INHALATION_SPRAY | Freq: Every day | RESPIRATORY_TRACT | 5 refills | Status: DC

## 2017-06-01 MED ORDER — NYSTATIN 100000 UNIT/ML MT SUSP: 5.0000 mL | Freq: Four times a day (QID) | OROMUCOSAL | 0 refills | Status: DC

## 2017-06-01 MED ORDER — MIRABEGRON ER 50 MG PO TB24: 50.0000 mg | ORAL_TABLET | Freq: Every day | ORAL | 3 refills | Status: DC

## 2017-06-01 NOTE — Progress Notes (Signed)
Location:  [facility] clinic Provider:  Shoua Ressler L. Mariea Clonts, D.O., C.M.D.  Code Status: DNR Goals of Care:  Advanced Directives 02/20/2017  Does Patient Have a Medical Advance Directive? Yes  Type of Advance Directive Out of facility DNR (pink MOST or yellow form)  Does patient want to make changes to medical advance directive? -  Copy of [place] in Chart? -  Would patient like information on creating a medical advance directive? -  Pre-existing out of facility DNR order (yellow form or pink MOST form) Pink MOST form placed in chart (order not valid for inpatient use)   Chief Complaint  Patient presents with  . Medical Management of Chronic Issues    34mth follow-up    HPI: Patient is a 76 y.o. female seen today for medical management of chronic diseases.    BP elevated.  Legs swollen and weight up on her scale, but hasn't take the lasix yet.    "Bronchitis stuff."  Has greenish yellow sputum, but not in lungs.   Feels much better than she did last week. Has not had chills.  Afebrile here.  POX 94% but again needs to take her lasix.    Urinating all the time now even w/o lasix.  Can't walk 15 ft w/o just going. Having to wear pads and it's going through those.  Says she did try myrbetriq years ago, and now she wants it b/c it worked then. Not having dysuria or abdominal pain or fever or flank pain.  Knows she needs to drink more water to be healthy, but with that, she cannot.  Has to avoid drinking when she goes out.    COPD--Spiriva:  Needs new Rx b/c lost device itself.  Wonders if she should try something new.  Has been getting infections anyway.    Dr. Estanislado Pandy did put her on fosamax after seeing her bone density.  Also on vitamin D 50K twice a week.    Last month or two, she's having more swelling in her thighs, also.  She is 272 lbs, this is up according to her scales so due to take lasix.  She is watching her sodium intake.  Not perfect b/c if she eats anything  prepared or canned, it's salty.    Knees are better after a series of hyalgan shots.    Past Medical History:  Diagnosis Date  . Abnormal weight gain   . Abnormality of gait 04/19/2016  . Acute infective polyneuritis ([place]) 2002  . Acute maxillary sinusitis   . Acute sinusitis, unspecified   . Allergic rhinitis due to pollen   . Arthritis   . Back injury   . Candidiasis of skin and nails   . Candidiasis of vulva and vagina   . Chronic pain syndrome   . COPD (chronic obstructive pulmonary disease) ([place])   . Cough   . Degenerative arthritis   . Depressive disorder, not elsewhere classified   . Diabetes mellitus without complication ([place])   . Diaphragmatic hernia without mention of obstruction or gangrene   . Dyslipidemia   . Edema   . Fibromyalgia   . GERD (gastroesophageal reflux disease)   . Guillain-Barre syndrome ([place])   . History of benign thymus tumor   . Hypertension   . Insomnia, unspecified   . Lumbago   . Miscarriage 1962  . Mixed hyperlipidemia   . Morbid obesity ([place])   . Obstructive chronic bronchitis with acute bronchitis ([place])   . Osteopenia, senile   .  Osteoporosis   . Other malaise and fatigue   . Other specified disease of white blood cells   . Pain in joint, multiple sites   . Polyneuropathy in diabetes(357.2)   . RA (rheumatoid arthritis) ([place])   . Reflux esophagitis   . Rheumatoid arthritis(714.0)   . Shortness of breath   . Spinal stenosis, lumbar region, without neurogenic claudication   . Spondylosis, lumbosacral   . Spontaneous ecchymoses   . Stiffness of joints, not elsewhere classified, multiple sites   . Tear film insufficiency, unspecified   . Thyroid disease   . Thyroid disorder   . Type II or unspecified type diabetes mellitus with peripheral circulatory disorders, uncontrolled(250.72)   . Unspecified chronic bronchitis ([place])   . Unspecified essential hypertension   . Unspecified hypothyroidism   . Unspecified pruritic disorder   .  Unspecified urinary incontinence   . Urinary tract infection, site not specified     Past Surgical History:  Procedure Laterality Date  . ABDOMINAL HYSTERECTOMY  1974  . abdominal tumor  2002  . APPENDECTOMY    . [place]  . BRONCHOSCOPY  2001  . CHOLECYSTECTOMY  1984  . KNEE SURGERY Bilateral 08/27/2010 (L) and 01/11/2011 (R)  . [place]  . OVARIAN CYST SURGERY  1968  . thymus tumor    . 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 TONSILLECTOMY      Allergies  Allergen Reactions  . Penicillins Hives, Itching, Swelling and Rash  . Sulfamethoxazole-Trimethoprim Itching    Outpatient Encounter Prescriptions as of 06/01/2017  Medication Sig  . alendronate (FOSAMAX) 70 MG tablet Take 1 tablet (70 mg total) by mouth once a week. Take with a full glass of water on an empty stomach.  Marland Kitchen amLODipine (NORVASC) 10 MG tablet Take 1 tablet (10 mg total) by mouth daily.  Marland Kitchen aspirin EC 81 MG tablet Take 81 mg by mouth daily as needed.   . B-D UF III MINI PEN NEEDLES 31G X 5 MM MISC USE AS DIRECTED  . Cholecalciferol (VITAMIN D3) 50000 units CAPS Take 50,000 Units by mouth 2 (two) times a week.  . folic acid (FOLVITE) 1 MG tablet Take 2 tablets (2 mg total) by mouth daily.  . furosemide (LASIX) 20 MG tablet TAKE 1 TABLET BY MOUTH TWICE DAILY AS NEEDED FOR 3 POUND WEIGHT GAIN IN ONE DAY OR 5 POUNDS IN ONE WEEK  . gabapentin (NEURONTIN) 600 MG tablet TAKE 1 TABLET BY MOUTH THREE TIMES DAILY  . glucose blood (ACCU-CHEK SMARTVIEW) test strip Check blood sugar 3-4 times daily  . HUMALOG KWIKPEN 200 UNIT/ML SOPN INJECT 20 UNITS INTO THE SKIN THREE TIMES DAILY BEFORE MEALS  . HYDROcodone-acetaminophen (NORCO) 10-325 MG tablet Take one tablet every three hours as needed for pain  . levothyroxine (SYNTHROID, LEVOTHROID) 137 MCG tablet TAKE 1 TABLET BY MOUTH DAILY BEFORE BREAKFAST ON AN EMPTY STOMACH( LABS OVERDUE)  . lisinopril (PRINIVIL,ZESTRIL) 20 MG tablet Take 2 tablets (40 mg total) by mouth daily.    Marland Kitchen loperamide (IMODIUM A-D) 2 MG tablet Take 2 mg by mouth 4 (four) times daily as needed for diarrhea or loose stools.  . magnesium oxide (MAG-OX) 400 MG tablet Take 400 mg by mouth daily as needed.  . Melatonin 10 MG CAPS Take 1 capsule by mouth at bedtime  . metoprolol tartrate (LOPRESSOR) 50 MG tablet   . NYSTATIN powder APPLY 1 GRAM TOPICALLY TO THE AFFECTED AREA DAILY AS NEEDED FOR ITCHING  . omega-3  acid ethyl esters (LOVAZA) 1 g capsule TAKE ONE CAPSULE BY MOUTH EVERY DAY  . oxyCODONE (OXY IR/ROXICODONE) 5 MG immediate release tablet Take one tablet every 6 hours as needed for severe pain, Take 2 tablets at bedtime.  . polyvinyl alcohol (LIQUIFILM TEARS) 1.4 % ophthalmic solution Place 1 drop into both eyes as needed (for dry eyes).   . potassium chloride (MICRO-K) 10 MEQ CR capsule Take 2 capsules by mouth 2 (two) times daily. While on lasix  . promethazine (PHENERGAN) 12.5 MG tablet TAKE 1 TABLET BY MOUTH AS NEEDED FOR SEVERE NAUSEA AS DIRECTED  . SPIRIVA HANDIHALER 18 MCG inhalation capsule INHALE THE CONTENTS OF 1 CAPSULE VIA HANDIHALER BY MOUTH DAILY FOR BREATHING  . TOUJEO SOLOSTAR 300 UNIT/ML SOPN INJECT 100 UNITS UNDER THE SKIN AT BEDTIME  . triamterene-hydrochlorothiazide (MAXZIDE-25) 37.5-25 MG tablet GIVE "Coda" 1 TABLET BY MOUTH DAILY FOR BLOOD PRESSURE  . venlafaxine XR (EFFEXOR-XR) 75 MG 24 hr capsule Take 1 capsule (75 mg total) by mouth daily with breakfast.  . VICTOZA 18 MG/3ML SOPN ADMINISTER 1.8 MG UNDER THE SKIN DAILY FOR BLOOD SUGAR  . XELJANZ 5 MG TABS TAKE 1 TABLET BY MOUTH TWICE DAILY  . [DISCONTINUED] Tuberculin-Allergy Syringes 27G X 1/2" 1 ML MISC 1 Syringe by Does not apply route once a week.   No facility-administered encounter medications on file as of 06/01/2017.     Review of Systems:  Review of Systems  Constitutional: Negative for chills, fever and malaise/fatigue.  HENT: Positive for congestion.   Eyes: Negative for blurred vision.  Respiratory:  Positive for cough, sputum production and shortness of breath. Negative for wheezing.   Cardiovascular: Positive for leg swelling. Negative for chest pain and palpitations.  Gastrointestinal: Negative for abdominal pain, blood in stool, constipation and melena.  Genitourinary: Positive for frequency and urgency. Negative for dysuria, flank pain and hematuria.  Musculoskeletal: Positive for joint pain. Negative for falls.  Skin: Negative for itching and rash.  Neurological: Positive for tingling and sensory change. Negative for dizziness, loss of consciousness and weakness.  Psychiatric/Behavioral: Negative for depression and memory loss. The patient is not nervous/anxious and does not have insomnia.        Better with mood this time    Health Maintenance  Topic Date Due  . FOOT EXAM  09/27/2016  . OPHTHALMOLOGY EXAM  08/21/2017 (Originally 12/20/1950)  . INFLUENZA VACCINE  08/22/2019 (Originally 03/22/2017)  . PNA vac Low Risk Adult (2 of 2 - PPSV23) 07/07/2017  . HEMOGLOBIN A1C  08/23/2017  . DEXA SCAN  09/20/2025  . TETANUS/TDAP  05/28/2026    Physical Exam: Vitals:   06/01/17 1348  BP: (!) 158/70  Pulse: 96  Temp: 98 F (36.7 C)  TempSrc: Oral  SpO2: 94%  Weight: 272 lb (123.4 kg)   Body mass index is 43.9 kg/m. Physical Exam  Constitutional: She is oriented to person, place, and time. She appears well-developed and well-nourished. No distress.  HENT:  Head: Normocephalic and atraumatic.  Right Ear: External ear normal.  Left Ear: External ear normal.  Mouth/Throat: Oropharynx is clear and moist. No oropharyngeal exudate.  Some nasal congestion and postnasal drip  Cardiovascular: Normal rate, regular rhythm, normal heart sounds and intact distal pulses.   Pulmonary/Chest: Effort normal. No respiratory distress. She has no wheezes. She has no rales.  Coarse rhonchi upper airways  Abdominal: Bowel sounds are normal.  Musculoskeletal: Normal range of motion.    Neurological: She is alert and oriented to person,  place, and time.  Skin: Skin is warm and dry.  Psychiatric: She has a normal mood and affect.    Labs reviewed: Basic Metabolic Panel:  Recent Labs  09/06/16 1514  12/29/16 1021 02/20/17 1150 04/27/17 1033  NA 141  < > 139 139 141  K 4.3  < > 4.2 4.3 4.4  CL 96  < > 100 99 101  CO2 20  < > 24 28 31   GLUCOSE 228*  < > 188* 146* 144*  BUN 20  < > 18 16 17   CREATININE 1.21*  < > 0.93 0.81 0.91  CALCIUM 9.8  < > 9.5 9.6 9.5  MG 2.0  --   --   --   --   TSH  --   --   --  1.43  --   < > = values in this interval not displayed. Liver Function Tests:  Recent Labs  10/06/16 0835 12/29/16 1021 02/20/17 1150 04/27/17 1033  AST 37* 44* 39* 44*  ALT 33* 26 32* 28  ALKPHOS 100 89 113  --   BILITOT 0.4 0.4 0.5 0.4  PROT 6.7 6.6 7.0 6.8  ALBUMIN 3.7 3.8 3.9  --    No results for input(s): LIPASE, AMYLASE in the last 8760 hours. No results for input(s): AMMONIA in the last 8760 hours. CBC:  Recent Labs  12/29/16 1021 02/20/17 1150 04/27/17 1033  WBC 12.0* 15.7* 13.8*  NEUTROABS 8,760* 11,461* 10,226*  HGB 13.5 14.2 14.1  HCT 41.5 43.9 43.2  MCV 84.5 85.7 84.5  PLT 308 321 302   Lipid Panel: No results for input(s): CHOL, HDL, LDLCALC, TRIG, CHOLHDL, LDLDIRECT in the last 8760 hours. Lab Results  Component Value Date   HGBA1C 8.2 (H) 02/20/2017   Assessment/Plan 1. Type 2 diabetes mellitus with diabetic polyneuropathy, with long-term current use of insulin (HCC) Lab Results  Component Value Date   HGBA1C 8.2 (H) 02/20/2017  which is actually pretty good for her over the past six years I've known her considering all of her other medical issues, pain, steroids -cont current insulin therapy with toujeo basal and humalog meal coverage--is followed by [facility] -also on victoza, ace, baby asa, not on statin due to intolerance  2. Hypothyroidism, unspecified type -continue current levothyroxine Lab Results  Component  Value Date   TSH 1.43 02/20/2017    3. Rheumatoid arthritis involving multiple sites with positive rheumatoid factor ([place]) -on xeljanz and felt to be controlled by rheum  4. Essential hypertension -bp at goal typically when not volume overloaded or having a copd exacerbation -advised to take her lasix as indicated  5. Age-related osteoporosis without current pathological fracture -agree with fosamax and vitamin D per rheum--continue these  6. Overactive bladder - requests to go back on myrbetriq--took samples at one point, but never got filled but now says it worked well, but problem wasn't bad enough to take another pill at that point, but now it is - two weeks of samples again given -pt had tried 50mg  last time so Rx sent for same dose also-- mirabegron ER (MYRBETRIQ) 50 MG TB24 tablet; Take 1 tablet (50 mg total) by mouth daily.  Dispense: 30 tablet; Refill: 3  7. Chronic bronchitis, unspecified chronic bronchitis type ([place]) -d/c spiriva, start anoro, appears her albuterol has fallen off her med list again to have available prn--should  Not be removed due to non-use as this is her rescue inhaler--renewed -sample anoro given and reviewed proper use with pt -  umeclidinium-vilanterol (ANORO ELLIPTA) 62.5-25 MCG/INH AEPB; Inhale 1 puff into the lungs daily.  Dispense: 60 each; Refill: 5  8. Oropharyngeal candidiasis - nystatin (MYCOSTATIN) 100000 UNIT/ML suspension; Take 5 mLs (500,000 Units total) by mouth 4 (four) times daily.  Dispense: 60 mL; Refill: 0 -counseled on rinsing mouth with water after inhaler use, reports also using biotene for dry mouth  Labs/tests ordered: No orders of the defined types were placed in this encounter.  Next appt:  10/05/2017 med mgt  Maurice Fotheringham L. Quinley Nesler, D.O. [facility] [address], [place] [address] Cell Phone (Mon-Fri 8am-5pm):  [phone] On Call:  [phone] & follow prompts after 5pm &  weekends Office Phone:  [phone] Office Fax:  [phone]

## 2017-06-05 MED ORDER — ALBUTEROL SULFATE HFA 108 (90 BASE) MCG/ACT IN AERS: 2.0000 | INHALATION_SPRAY | Freq: Four times a day (QID) | RESPIRATORY_TRACT | 3 refills | Status: DC | PRN

## 2017-06-07 ENCOUNTER — Other Ambulatory Visit: Payer: Self-pay | Admitting: Internal Medicine

## 2017-06-13 ENCOUNTER — Other Ambulatory Visit: Payer: Self-pay | Admitting: *Deleted

## 2017-06-13 DIAGNOSIS — M545 Low back pain: Principal | ICD-10-CM

## 2017-06-13 DIAGNOSIS — M797 Fibromyalgia: Secondary | ICD-10-CM

## 2017-06-13 DIAGNOSIS — G9332 Myalgic encephalomyelitis/chronic fatigue syndrome: Secondary | ICD-10-CM

## 2017-06-13 DIAGNOSIS — R5382 Chronic fatigue, unspecified: Secondary | ICD-10-CM

## 2017-06-13 DIAGNOSIS — G8929 Other chronic pain: Secondary | ICD-10-CM

## 2017-06-13 MED ORDER — HYDROCODONE-ACETAMINOPHEN 10-325 MG PO TABS: ORAL_TABLET | ORAL | 0 refills | Status: DC

## 2017-06-13 MED ORDER — OXYCODONE HCL 5 MG PO TABS: ORAL_TABLET | ORAL | 0 refills | Status: DC

## 2017-06-13 NOTE — Telephone Encounter (Signed)
Patient requested and will pick up [facility] Database Checked.  

## 2017-06-15 ENCOUNTER — Other Ambulatory Visit: Payer: Self-pay | Admitting: Internal Medicine

## 2017-06-22 ENCOUNTER — Other Ambulatory Visit: Payer: Self-pay | Admitting: Internal Medicine

## 2017-06-23 NOTE — Telephone Encounter (Signed)
rx sent to pharmacy by e-script  

## 2017-06-24 ENCOUNTER — Other Ambulatory Visit: Payer: Self-pay | Admitting: Internal Medicine

## 2017-07-05 ENCOUNTER — Other Ambulatory Visit: Payer: Self-pay | Admitting: Rheumatology

## 2017-07-05 NOTE — Telephone Encounter (Signed)
Last visit: 01/26/17 Next visit: 09/28/17 Labs: 04/27/17 WBC c/w previous   Okay to refill per Dr. Estanislado Pandy.

## 2017-07-06 ENCOUNTER — Other Ambulatory Visit: Payer: Self-pay | Admitting: Internal Medicine

## 2017-07-10 ENCOUNTER — Other Ambulatory Visit: Payer: Self-pay | Admitting: Internal Medicine

## 2017-07-12 ENCOUNTER — Other Ambulatory Visit: Payer: Self-pay | Admitting: *Deleted

## 2017-07-12 DIAGNOSIS — G8929 Other chronic pain: Secondary | ICD-10-CM

## 2017-07-12 DIAGNOSIS — M797 Fibromyalgia: Secondary | ICD-10-CM

## 2017-07-12 DIAGNOSIS — R5382 Chronic fatigue, unspecified: Secondary | ICD-10-CM

## 2017-07-12 DIAGNOSIS — G9332 Myalgic encephalomyelitis/chronic fatigue syndrome: Secondary | ICD-10-CM

## 2017-07-12 DIAGNOSIS — M545 Low back pain: Principal | ICD-10-CM

## 2017-07-12 MED ORDER — HYDROCODONE-ACETAMINOPHEN 10-325 MG PO TABS: ORAL_TABLET | ORAL | 0 refills | Status: DC

## 2017-07-12 MED ORDER — OXYCODONE HCL 5 MG PO TABS: ORAL_TABLET | ORAL | 0 refills | Status: DC

## 2017-07-12 NOTE — Telephone Encounter (Signed)
Patient requested and will pick up [facility]CCS[facility] Database Verified.  

## 2017-07-14 ENCOUNTER — Other Ambulatory Visit: Payer: Self-pay | Admitting: Internal Medicine

## 2017-07-23 ENCOUNTER — Other Ambulatory Visit: Payer: Self-pay | Admitting: Internal Medicine

## 2017-07-25 ENCOUNTER — Other Ambulatory Visit: Payer: Self-pay | Admitting: Internal Medicine

## 2017-08-02 ENCOUNTER — Telehealth: Payer: Self-pay

## 2017-08-02 NOTE — Telephone Encounter (Signed)
Called patient to try to schedule AWV in the office. No answer-left voicemail to call back.    

## 2017-08-03 ENCOUNTER — Other Ambulatory Visit: Payer: Self-pay | Admitting: Internal Medicine

## 2017-08-03 NOTE — Telephone Encounter (Signed)
Please advise if this is a long-term medication for patient, if yes can additional refills be given?

## 2017-08-07 ENCOUNTER — Other Ambulatory Visit: Payer: Self-pay | Admitting: Rheumatology

## 2017-08-07 NOTE — Telephone Encounter (Addendum)
Last visit: 01/26/17 Next visit: 09/28/17 Labs: 04/27/17 WBC c/w previous  Patient advised lab are due to be updated. Patient will come the end of next week.   Okay to refill 30 day supply per Dr. Estanislado Pandy

## 2017-08-09 ENCOUNTER — Other Ambulatory Visit: Payer: Self-pay | Admitting: Internal Medicine

## 2017-08-10 ENCOUNTER — Other Ambulatory Visit: Payer: Self-pay | Admitting: *Deleted

## 2017-08-10 DIAGNOSIS — G9332 Myalgic encephalomyelitis/chronic fatigue syndrome: Secondary | ICD-10-CM

## 2017-08-10 DIAGNOSIS — G8929 Other chronic pain: Secondary | ICD-10-CM

## 2017-08-10 DIAGNOSIS — R5382 Chronic fatigue, unspecified: Secondary | ICD-10-CM

## 2017-08-10 DIAGNOSIS — M797 Fibromyalgia: Secondary | ICD-10-CM

## 2017-08-10 DIAGNOSIS — M545 Low back pain: Principal | ICD-10-CM

## 2017-08-10 MED ORDER — HYDROCODONE-ACETAMINOPHEN 10-325 MG PO TABS: ORAL_TABLET | ORAL | 0 refills | Status: DC

## 2017-08-10 MED ORDER — OXYCODONE HCL 5 MG PO TABS: ORAL_TABLET | ORAL | 0 refills | Status: DC

## 2017-08-10 NOTE — Telephone Encounter (Signed)
Patient called requesting refill on Narcotic.  [facility] Rx and sent to Dr. Mariea Clonts for approval.  Pharmacy Verified.

## 2017-08-17 ENCOUNTER — Other Ambulatory Visit: Payer: Self-pay | Admitting: Internal Medicine

## 2017-08-21 ENCOUNTER — Other Ambulatory Visit: Payer: Self-pay | Admitting: Cardiology

## 2017-08-24 ENCOUNTER — Ambulatory Visit (INDEPENDENT_AMBULATORY_CARE_PROVIDER_SITE_OTHER): Payer: Medicare Other | Admitting: Internal Medicine

## 2017-08-24 ENCOUNTER — Encounter: Payer: Self-pay | Admitting: Internal Medicine

## 2017-08-24 VITALS — BP 150/70 | HR 77 | Temp 98.0°F | Wt 257.0 lb

## 2017-08-24 DIAGNOSIS — Z794 Long term (current) use of insulin: Secondary | ICD-10-CM | POA: Diagnosis not present

## 2017-08-24 DIAGNOSIS — E1142 Type 2 diabetes mellitus with diabetic polyneuropathy: Secondary | ICD-10-CM

## 2017-08-24 DIAGNOSIS — N3281 Overactive bladder: Secondary | ICD-10-CM | POA: Diagnosis not present

## 2017-08-24 DIAGNOSIS — G8929 Other chronic pain: Secondary | ICD-10-CM | POA: Diagnosis not present

## 2017-08-24 DIAGNOSIS — R300 Dysuria: Secondary | ICD-10-CM | POA: Diagnosis not present

## 2017-08-24 DIAGNOSIS — M545 Low back pain: Secondary | ICD-10-CM | POA: Diagnosis not present

## 2017-08-24 LAB — POCT URINALYSIS DIPSTICK
Bilirubin, UA: NEGATIVE
Glucose, UA: NEGATIVE
Ketones, UA: NEGATIVE
Nitrite, UA: NEGATIVE
Spec Grav, UA: 1.01 (ref 1.010–1.025)
Urobilinogen, UA: NEGATIVE E.U./dL — AB
pH, UA: 7 (ref 5.0–8.0)

## 2017-08-24 NOTE — Progress Notes (Signed)
Location:  [facility] clinic Provider: Sweetie Giebler L. Mariea Clonts, D.O., C.M.D.  Code Status: DNR Goals of Care:  Advanced Directives 02/20/2017  Does Patient Have a Medical Advance Directive? Yes  Type of Advance Directive Out of facility DNR (pink MOST or yellow form)  Does patient want to make changes to medical advance directive? -  Copy of [place] in Chart? -  Would patient like information on creating a medical advance directive? -  Pre-existing out of facility DNR order (yellow form or pink MOST form) Pink MOST form placed in chart (order not valid for inpatient use)     Chief Complaint  Patient presents with  . Acute Visit    UTI x1 week, burning & itching, incontinence    HPI: Patient is a 77 y.o. female seen today for an acute visit for possible UTI.  It's some better than it was over the weekend.  Tried to deal with it.  After thanksgiving, she took cold/flu mess and had bronchitis, laryngitis, etc.  Used mucinex and otc stuff.  Got a UTI with buring and itching.  Incontinence is driving her crazy.  Was coming and going before, but now all of the time.  Going on all the time for 3 weeks.  Tried avoiding drinking as much fluid but didn't help.  Myrbetriq has not helped her.  Urine dip today with LE, but negative for nitrites.  She also has some trouble controlling her bowels now.  Says she'll feel constipated but when she has a BM, she'll have diarrhea.  Has had a few accidents.    Plans to go back to Dr. Jannifer Franklin about her back worsening with neuropathic pain down her legs and says balance is getting worse.  Can't stand more than 5 mins till she gets severe pain in her back and legs.    Has lost weight initially fluid after July, but now dietary changes.    Past Medical History:  Diagnosis Date  . Abnormal weight gain   . Abnormality of gait 04/19/2016  . Acute infective polyneuritis ([place]) 2002  . Acute maxillary sinusitis   . Acute sinusitis, unspecified   . Allergic  rhinitis due to pollen   . Arthritis   . Back injury   . Candidiasis of skin and nails   . Candidiasis of vulva and vagina   . Chronic pain syndrome   . COPD (chronic obstructive pulmonary disease) ([place])   . Cough   . Degenerative arthritis   . Depressive disorder, not elsewhere classified   . Diabetes mellitus without complication ([place])   . Diaphragmatic hernia without mention of obstruction or gangrene   . Dyslipidemia   . Edema   . Fibromyalgia   . GERD (gastroesophageal reflux disease)   . Guillain-Barre syndrome ([place])   . History of benign thymus tumor   . Hypertension   . Insomnia, unspecified   . Lumbago   . Miscarriage 1962  . Mixed hyperlipidemia   . Morbid obesity ([place])   . Obstructive chronic bronchitis with acute bronchitis ([place])   . Osteopenia, senile   . Osteoporosis   . Other malaise and fatigue   . Other specified disease of white blood cells   . Pain in joint, multiple sites   . Polyneuropathy in diabetes(357.2)   . RA (rheumatoid arthritis) ([place])   . Reflux esophagitis   . Rheumatoid arthritis(714.0)   . Shortness of breath   . Spinal stenosis, lumbar region, without neurogenic claudication   .  Spondylosis, lumbosacral   . Spontaneous ecchymoses   . Stiffness of joints, not elsewhere classified, multiple sites   . Tear film insufficiency, unspecified   . Thyroid disease   . Thyroid disorder   . Type II or unspecified type diabetes mellitus with peripheral circulatory disorders, uncontrolled(250.72)   . Unspecified chronic bronchitis ([place])   . Unspecified essential hypertension   . Unspecified hypothyroidism   . Unspecified pruritic disorder   . Unspecified urinary incontinence   . Urinary tract infection, site not specified     Past Surgical History:  Procedure Laterality Date  . ABDOMINAL HYSTERECTOMY  1974  . abdominal tumor  2002  . APPENDECTOMY    . [place]  . BRONCHOSCOPY  2001  . CHOLECYSTECTOMY  1984  . KNEE SURGERY  Bilateral 08/27/2010 (L) and 01/11/2011 (R)  . [place]  . OVARIAN CYST SURGERY  1968  . thymus tumor    . 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 TONSILLECTOMY      Allergies  Allergen Reactions  . Penicillins Hives, Itching, Swelling and Rash  . Statins Other (See Comments)    Myopathy, transaminitis  . Sulfamethoxazole-Trimethoprim Itching    Outpatient Encounter Medications as of 08/24/2017  Medication Sig  . albuterol (PROVENTIL HFA;VENTOLIN HFA) 108 (90 Base) MCG/ACT inhaler Inhale 2 puffs into the lungs every 6 (six) hours as needed for wheezing or shortness of breath.  Marland Kitchen alendronate (FOSAMAX) 70 MG tablet Take 1 tablet (70 mg total) by mouth once a week. Take with a full glass of water on an empty stomach.  Marland Kitchen amLODipine (NORVASC) 10 MG tablet Take 1 tablet (10 mg total) by mouth daily.  Marland Kitchen aspirin EC 81 MG tablet Take 81 mg by mouth daily as needed.   . B-D UF III MINI PEN NEEDLES 31G X 5 MM MISC USE AS DIRECTED  . folic acid (FOLVITE) 1 MG tablet Take 2 tablets (2 mg total) by mouth daily.  . furosemide (LASIX) 20 MG tablet TAKE 1 TABLET BY MOUTH TWICE DAILY AS NEEDED FOR 3 POUND WEIGHT GAIN IN ONE DAY OR 5 POUNDS IN ONE WEEK  . gabapentin (NEURONTIN) 600 MG tablet TAKE 1 TABLET BY MOUTH THREE TIMES DAILY  . glucose blood (ACCU-CHEK SMARTVIEW) test strip Check blood sugar 3-4 times daily  . HUMALOG KWIKPEN 200 UNIT/ML SOPN INJECT 20 UNITS INTO THE SKIN THREE TIMES DAILY BEFORE MEALS  . HYDROcodone-acetaminophen (NORCO) 10-325 MG tablet Take one tablet every three hours as needed for pain  . levothyroxine (SYNTHROID, LEVOTHROID) 137 MCG tablet TAKE 1 TABLET BY MOUTH DAILY BEFORE BREAKFAST ON AN EMPTY STOMACH( LABS OVERDUE)  . lisinopril (PRINIVIL,ZESTRIL) 20 MG tablet Take 2 tablets (40 mg total) by mouth daily.  Marland Kitchen lisinopril (PRINIVIL,ZESTRIL) 20 MG tablet TAKE 1 TABLET(20 MG) BY MOUTH DAILY  . loperamide (IMODIUM A-D) 2 MG tablet Take 2 mg by mouth 4 (four) times daily as needed for  diarrhea or loose stools.  . magnesium oxide (MAG-OX) 400 MG tablet Take 400 mg by mouth daily as needed.  . Melatonin 10 MG CAPS Take 1 capsule by mouth at bedtime  . metoprolol tartrate (LOPRESSOR) 50 MG tablet   . mirabegron ER (MYRBETRIQ) 50 MG TB24 tablet Take 1 tablet (50 mg total) by mouth daily.  Marland Kitchen nystatin (MYCOSTATIN) 100000 UNIT/ML suspension Take 5 mLs (500,000 Units total) by mouth 4 (four) times daily.  . NYSTATIN powder APPLY 1 GRAM TOPICALLY TO THE AFFECTED AREA DAILY AS NEEDED FOR  ITCHING  . omega-3 acid ethyl esters (LOVAZA) 1 g capsule TAKE ONE CAPSULE BY MOUTH EVERY DAY  . oxyCODONE (OXY IR/ROXICODONE) 5 MG immediate release tablet Take one tablet every 6 hours as needed for severe pain, Take 2 tablets at bedtime.  . polyvinyl alcohol (LIQUIFILM TEARS) 1.4 % ophthalmic solution Place 1 drop into both eyes as needed (for dry eyes).   . potassium chloride (MICRO-K) 10 MEQ CR capsule Take 2 capsules by mouth 2 (two) times daily. While on lasix  . promethazine (PHENERGAN) 12.5 MG tablet TAKE 1 TABLET BY MOUTH AS NEEDED FOR SEVERE NAUSEA AS DIRECTED  . TOUJEO SOLOSTAR 300 UNIT/ML SOPN INJECT 100 UNITS UNDER THE SKIN EVERY NIGHT AT BEDTIME  . triamterene-hydrochlorothiazide (MAXZIDE-25) 37.5-25 MG tablet TAKE 1 TABLET BY MOUTH DAILY  . umeclidinium-vilanterol (ANORO ELLIPTA) 62.5-25 MCG/INH AEPB Inhale 1 puff into the lungs daily.  Marland Kitchen venlafaxine XR (EFFEXOR-XR) 75 MG 24 hr capsule TAKE 1 CAPSULE(75 MG) BY MOUTH DAILY WITH BREAKFAST  . VICTOZA 18 MG/3ML SOPN ADMINISTER 1.8 MG UNDER THE SKIN DAILY FOR BLOOD SUGAR  . XELJANZ 5 MG TABS TAKE 1 TABLET BY MOUTH TWICE DAILY   No facility-administered encounter medications on file as of 08/24/2017.     Review of Systems:  Review of Systems  Constitutional: Negative for chills, fever and malaise/fatigue.  HENT: Negative for congestion.   Eyes: Negative for blurred vision.  Respiratory: Positive for cough. Negative for sputum production  and shortness of breath.   Cardiovascular: Negative for chest pain, palpitations and PND.  Gastrointestinal: Negative for abdominal pain, blood in stool, constipation and melena.  Genitourinary: Positive for dysuria, frequency and urgency. Negative for flank pain and hematuria.  Musculoskeletal: Positive for back pain. Negative for falls.  Neurological: Positive for tingling and sensory change. Negative for weakness.  Endo/Heme/Allergies: Does not bruise/bleed easily.  Psychiatric/Behavioral: Negative for depression and memory loss. The patient is not nervous/anxious and does not have insomnia.     Health Maintenance  Topic Date Due  . OPHTHALMOLOGY EXAM  12/20/1950  . FOOT EXAM  09/27/2016  . PNA vac Low Risk Adult (2 of 2 - PPSV23) 07/07/2017  . HEMOGLOBIN A1C  08/23/2017  . INFLUENZA VACCINE  08/22/2019 (Originally 03/22/2017)  . DEXA SCAN  09/20/2025  . TETANUS/TDAP  05/28/2026    Physical Exam: Vitals:   08/24/17 1026  BP: (!) 150/70  Pulse: 77  Temp: 98 F (36.7 C)  TempSrc: Oral  SpO2: 99%  Weight: 257 lb (116.6 kg)   Body mass index is 41.48 kg/m. Physical Exam  Constitutional: She is oriented to person, place, and time. She appears well-developed. No distress.  Cardiovascular: Normal rate, regular rhythm, normal heart sounds and intact distal pulses.  Pulmonary/Chest: Effort normal and breath sounds normal. No respiratory distress. She has no wheezes.  Abdominal: Bowel sounds are normal.  Musculoskeletal: Normal range of motion.  Walks without assistive device or uses cane at times  Neurological: She is alert and oriented to person, place, and time.  Skin: Skin is warm and dry. Capillary refill takes less than 2 seconds.  Psychiatric: She has a normal mood and affect.    Labs reviewed: Basic Metabolic Panel: Recent Labs    09/06/16 1514  12/29/16 1021 02/20/17 1150 04/27/17 1033  NA 141   < > 139 139 141  K 4.3   < > 4.2 4.3 4.4  CL 96   < > 100 99 101    CO2 20   < >  24 28 31   GLUCOSE 228*   < > 188* 146* 144*  BUN 20   < > 18 16 17   CREATININE 1.21*   < > 0.93 0.81 0.91  CALCIUM 9.8   < > 9.5 9.6 9.5  MG 2.0  --   --   --   --   TSH  --   --   --  1.43  --    < > = values in this interval not displayed.   Liver Function Tests: Recent Labs    10/06/16 0835 12/29/16 1021 02/20/17 1150 04/27/17 1033  AST 37* 44* 39* 44*  ALT 33* 26 32* 28  ALKPHOS 100 89 113  --   BILITOT 0.4 0.4 0.5 0.4  PROT 6.7 6.6 7.0 6.8  ALBUMIN 3.7 3.8 3.9  --    No results for input(s): LIPASE, AMYLASE in the last 8760 hours. No results for input(s): AMMONIA in the last 8760 hours. CBC: Recent Labs    12/29/16 1021 02/20/17 1150 04/27/17 1033  WBC 12.0* 15.7* 13.8*  NEUTROABS 8,760* 11,461* 10,226*  HGB 13.5 14.2 14.1  HCT 41.5 43.9 43.2  MCV 84.5 85.7 84.5  PLT 308 321 302   Lipid Panel: No results for input(s): CHOL, HDL, LDLCALC, TRIG, CHOLHDL, LDLDIRECT in the last 8760 hours. Lab Results  Component Value Date   HGBA1C 8.2 (H) 02/20/2017    Assessment/Plan 1. Dysuria - suspect UTI, reports improvement and dipstick not confirmatory, send culture due to symptoms and worsened incontinence - POC Urinalysis Dipstick - Urine Culture  2. Overactive bladder - has not had benefit from [facility], will refer to urology for full workup - Ambulatory referral to Urology  3. Type 2 diabetes mellitus with diabetic polyneuropathy, with long-term current use of insulin (HCC) - control fairly good for her last eval, has apt coming up and labs before it   4. Chronic bilateral low back pain, with sciatica presence unspecified - reports this is worsening and mentions the bowel and bladder incontinence, but I'm not so sure they're actually associated b/c it's not a permanent pain, only when she stands for a while - will plan to f/u with Dr.Willis, neurology  Labs/tests ordered:   Orders Placed This Encounter  Procedures  . Urine Culture  .  Ambulatory referral to Urology    Referral Priority:   Routine    Referral Type:   Consultation    Referral Reason:   Specialty Services Required    Requested Specialty:   Urology    Number of Visits Requested:   1  . POC Urinalysis Dipstick    Next appt:  09/04/2017  Fareed Fung L. Jaylean Buenaventura, D.O. [facility] [address], [place] [address] Cell Phone (Mon-Fri 8am-5pm):  [phone] On Call:  [phone] & follow prompts after 5pm & weekends Office Phone:  [phone] Office Fax:  [phone]

## 2017-08-25 LAB — URINE CULTURE
MICRO NUMBER:: 90008685
SPECIMEN QUALITY:: ADEQUATE

## 2017-09-04 ENCOUNTER — Ambulatory Visit: Payer: Medicare Other

## 2017-09-06 ENCOUNTER — Other Ambulatory Visit: Payer: Self-pay | Admitting: Rheumatology

## 2017-09-07 NOTE — Telephone Encounter (Signed)
Last visit: 01/26/17 Next visit: 01/05/18 Labs: 9/6/18WBC c/w previous  Left message to advise patient we need labs before we can refill medication

## 2017-09-08 ENCOUNTER — Other Ambulatory Visit: Payer: Self-pay

## 2017-09-08 ENCOUNTER — Other Ambulatory Visit: Payer: Self-pay | Admitting: *Deleted

## 2017-09-08 DIAGNOSIS — R5382 Chronic fatigue, unspecified: Secondary | ICD-10-CM

## 2017-09-08 DIAGNOSIS — Z79899 Other long term (current) drug therapy: Secondary | ICD-10-CM

## 2017-09-08 DIAGNOSIS — M797 Fibromyalgia: Secondary | ICD-10-CM

## 2017-09-08 DIAGNOSIS — G8929 Other chronic pain: Secondary | ICD-10-CM

## 2017-09-08 DIAGNOSIS — M545 Low back pain: Principal | ICD-10-CM

## 2017-09-08 DIAGNOSIS — G9332 Myalgic encephalomyelitis/chronic fatigue syndrome: Secondary | ICD-10-CM

## 2017-09-08 LAB — COMPLETE METABOLIC PANEL WITH GFR
AG Ratio: 1.3 (calc) (ref 1.0–2.5)
ALT: 20 U/L (ref 6–29)
AST: 33 U/L (ref 10–35)
Albumin: 3.8 g/dL (ref 3.6–5.1)
Alkaline phosphatase (APISO): 106 U/L (ref 33–130)
BUN/Creatinine Ratio: 20 (calc) (ref 6–22)
BUN: 19 mg/dL (ref 7–25)
CALCIUM: 9.8 mg/dL (ref 8.6–10.4)
CO2: 30 mmol/L (ref 20–32)
CREATININE: 0.95 mg/dL — AB (ref 0.60–0.93)
Chloride: 101 mmol/L (ref 98–110)
GFR, EST AFRICAN AMERICAN: 67 mL/min/{1.73_m2} (ref >=60)
GFR, Est Non African American: 58 mL/min/{1.73_m2} — ABNORMAL LOW (ref >=60)
GLUCOSE: 153 mg/dL — AB (ref 65–99)
Globulin: 2.9 g/dL (calc) (ref 1.9–3.7)
POTASSIUM: 5 mmol/L (ref 3.5–5.3)
Sodium: 140 mmol/L (ref 135–146)
TOTAL PROTEIN: 6.7 g/dL (ref 6.1–8.1)
Total Bilirubin: 0.4 mg/dL (ref 0.2–1.2)

## 2017-09-08 LAB — CBC WITH DIFFERENTIAL/PLATELET
BASOS PCT: 0.7 %
Basophils Absolute: 104 cells/uL (ref 0–200)
Eosinophils Absolute: 507 cells/uL — ABNORMAL HIGH (ref 15–500)
Eosinophils Relative: 3.4 %
HEMATOCRIT: 42.8 % (ref 35.0–45.0)
HEMOGLOBIN: 13.9 g/dL (ref 11.7–15.5)
LYMPHS ABS: 2220 {cells}/uL (ref 850–3900)
MCH: 26.9 pg — AB (ref 27.0–33.0)
MCHC: 32.5 g/dL (ref 32.0–36.0)
MCV: 82.9 fL (ref 80.0–100.0)
MONOS PCT: 8.1 %
MPV: 11.5 fL (ref 7.5–12.5)
NEUTROS ABS: 10862 {cells}/uL — AB (ref 1500–7800)
Neutrophils Relative %: 72.9 %
Platelets: 292 10*3/uL (ref 140–400)
RBC: 5.16 10*6/uL — ABNORMAL HIGH (ref 3.80–5.10)
RDW: 13.2 % (ref 11.0–15.0)
Total Lymphocyte: 14.9 %
WBC mixed population: 1207 cells/uL — ABNORMAL HIGH (ref 200–950)
WBC: 14.9 10*3/uL — ABNORMAL HIGH (ref 3.8–10.8)

## 2017-09-08 MED ORDER — OXYCODONE HCL 5 MG PO TABS: ORAL_TABLET | ORAL | 0 refills | Status: DC

## 2017-09-08 MED ORDER — HYDROCODONE-ACETAMINOPHEN 10-325 MG PO TABS: 1.0000 | ORAL_TABLET | ORAL | 0 refills | Status: DC | PRN

## 2017-09-08 NOTE — Telephone Encounter (Signed)
Patient called and requested [place] Confirmed Pended Rx and sent to Dr. Mariea Clonts for approval

## 2017-09-11 ENCOUNTER — Telehealth: Payer: Self-pay | Admitting: Rheumatology

## 2017-09-11 NOTE — Telephone Encounter (Signed)
Patient called stating that she is returning a call to our office that she received on 1/18.  She thinks it might be regarding the results of her labs.  CB# [phone]

## 2017-09-11 NOTE — Progress Notes (Signed)
Patient is on [place]. Her white cell count is elevated. Please contact patient to check if she has any infection. In that case she should stop her medication. Rest of  the labs are stable. Her glucose is still elevated. Please fax results to her PCP.

## 2017-09-11 NOTE — Telephone Encounter (Signed)
Patient advised of lab results and verbalized understanding.  

## 2017-09-16 ENCOUNTER — Other Ambulatory Visit: Payer: Self-pay | Admitting: Internal Medicine

## 2017-09-17 ENCOUNTER — Other Ambulatory Visit: Payer: Self-pay | Admitting: Internal Medicine

## 2017-09-22 ENCOUNTER — Other Ambulatory Visit: Payer: Self-pay | Admitting: Rheumatology

## 2017-09-22 ENCOUNTER — Other Ambulatory Visit: Payer: Self-pay | Admitting: Student

## 2017-09-25 ENCOUNTER — Other Ambulatory Visit: Payer: Self-pay | Admitting: Cardiology

## 2017-09-25 NOTE — Telephone Encounter (Signed)
Discussed with Dr. Estanislado Pandy.  Ok to refill per Dr. Estanislado Pandy.

## 2017-09-25 NOTE — Telephone Encounter (Signed)
Rx(s) sent to pharmacy electronically.  

## 2017-09-25 NOTE — Telephone Encounter (Signed)
Rx request sent to pharmacy.  

## 2017-09-25 NOTE — Telephone Encounter (Signed)
Ok to refill 

## 2017-09-25 NOTE — Telephone Encounter (Signed)
Last visit: 01/26/2017 Next visit: 01/05/2018 Labs: 09/08/2017 white cell count is elevated. Patient states she does not have an infection.  Okay to refill xeljanz?

## 2017-09-26 ENCOUNTER — Other Ambulatory Visit: Payer: Self-pay | Admitting: Internal Medicine

## 2017-09-28 ENCOUNTER — Ambulatory Visit: Payer: Medicare Other | Admitting: Rheumatology

## 2017-10-05 ENCOUNTER — Ambulatory Visit (INDEPENDENT_AMBULATORY_CARE_PROVIDER_SITE_OTHER): Payer: Medicare Other

## 2017-10-05 ENCOUNTER — Encounter: Payer: Self-pay | Admitting: Internal Medicine

## 2017-10-05 ENCOUNTER — Ambulatory Visit (INDEPENDENT_AMBULATORY_CARE_PROVIDER_SITE_OTHER): Payer: Medicare Other | Admitting: Internal Medicine

## 2017-10-05 VITALS — BP 158/88 | HR 82 | Temp 99.0°F | Ht 66.0 in | Wt 258.0 lb

## 2017-10-05 DIAGNOSIS — M0579 Rheumatoid arthritis with rheumatoid factor of multiple sites without organ or systems involvement: Secondary | ICD-10-CM

## 2017-10-05 DIAGNOSIS — R300 Dysuria: Secondary | ICD-10-CM | POA: Diagnosis not present

## 2017-10-05 DIAGNOSIS — M545 Low back pain: Secondary | ICD-10-CM | POA: Diagnosis not present

## 2017-10-05 DIAGNOSIS — Z794 Long term (current) use of insulin: Secondary | ICD-10-CM

## 2017-10-05 DIAGNOSIS — G8929 Other chronic pain: Secondary | ICD-10-CM | POA: Diagnosis not present

## 2017-10-05 DIAGNOSIS — Z0000 Encounter for general adult medical examination without abnormal findings: Secondary | ICD-10-CM

## 2017-10-05 DIAGNOSIS — Z23 Encounter for immunization: Secondary | ICD-10-CM | POA: Diagnosis not present

## 2017-10-05 DIAGNOSIS — N3281 Overactive bladder: Secondary | ICD-10-CM

## 2017-10-05 DIAGNOSIS — M797 Fibromyalgia: Secondary | ICD-10-CM

## 2017-10-05 DIAGNOSIS — Z9189 Other specified personal risk factors, not elsewhere classified: Secondary | ICD-10-CM

## 2017-10-05 DIAGNOSIS — E1142 Type 2 diabetes mellitus with diabetic polyneuropathy: Secondary | ICD-10-CM

## 2017-10-05 MED ORDER — PREGABALIN 150 MG PO CAPS: 150.0000 mg | ORAL_CAPSULE | Freq: Two times a day (BID) | ORAL | 3 refills | Status: DC

## 2017-10-05 NOTE — Progress Notes (Signed)
Location:  [facility] clinic Provider:  Ellarie Picking L. Mariea Clonts, D.O., C.M.D.  Code Status: DNR Goals of Care:  Advanced Directives 10/05/2017  Does Patient Have a Medical Advance Directive? Yes  Type of Paramedic of [place];Living will  Does patient want to make changes to medical advance directive? No - Patient declined  Copy of [place] in Chart? No - copy requested  Would patient like information on creating a medical advance directive? -  Pre-existing out of facility DNR order (yellow form or pink MOST form) -   Chief Complaint  Patient presents with  . Medical Management of Chronic Issues    4 month for med mgt    HPI: Patient is a 77 y.o. female seen today for medical management of chronic diseases.    Bladder thing and no control is worsening.  It hurts to urinate and all of the time and sees urologist tomorrow.  Feels like there's something in there that is not supposed to be in there.  myrbetriq had not helped.    Pain has all flared up in back, hips and down legs.  Numbness and pain feels like lightning going down the legs and it's all flared up. Doesn't see Dr. Jannifer Franklin until May unless there's a cancellation. Has gabapentin 600mg  po tid.  Says when I reduced her hydrocodone, it made matters worse.  Would take her pain medication at bedtime b/c she could not sleep with restless leg type symptoms.  Getting up often to urinate.    No difficulty with dyspnea or increased edema recently.  Has been drinking less fluids due to her bladder problems and periods of all-out incontinence.  Past Medical History:  Diagnosis Date  . Abnormal weight gain   . Abnormality of gait 04/19/2016  . Acute infective polyneuritis ([place]) 2002  . Acute maxillary sinusitis   . Acute sinusitis, unspecified   . Allergic rhinitis due to pollen   . Arthritis   . Back injury   . Candidiasis of skin and nails   . Candidiasis of vulva and vagina   . Chronic pain  syndrome   . COPD (chronic obstructive pulmonary disease) ([place])   . Cough   . Degenerative arthritis   . Depressive disorder, not elsewhere classified   . Diabetes mellitus without complication ([place])   . Diaphragmatic hernia without mention of obstruction or gangrene   . Dyslipidemia   . Edema   . Fibromyalgia   . GERD (gastroesophageal reflux disease)   . Guillain-Barre syndrome ([place])   . History of benign thymus tumor   . Hypertension   . Insomnia, unspecified   . Lumbago   . Miscarriage 1962  . Mixed hyperlipidemia   . Morbid obesity ([place])   . Obstructive chronic bronchitis with acute bronchitis ([place])   . Osteopenia, senile   . Osteoporosis   . Other malaise and fatigue   . Other specified disease of white blood cells   . Pain in joint, multiple sites   . Polyneuropathy in diabetes(357.2)   . RA (rheumatoid arthritis) ([place])   . Reflux esophagitis   . Rheumatoid arthritis(714.0)   . Shortness of breath   . Spinal stenosis, lumbar region, without neurogenic claudication   . Spondylosis, lumbosacral   . Spontaneous ecchymoses   . Stiffness of joints, not elsewhere classified, multiple sites   . Tear film insufficiency, unspecified   . Thyroid disease   . Thyroid disorder   . Type II or unspecified type  diabetes mellitus with peripheral circulatory disorders, uncontrolled(250.72)   . Unspecified chronic bronchitis ([place])   . Unspecified essential hypertension   . Unspecified hypothyroidism   . Unspecified pruritic disorder   . Unspecified urinary incontinence   . Urinary tract infection, site not specified     Past Surgical History:  Procedure Laterality Date  . ABDOMINAL HYSTERECTOMY  1974  . abdominal tumor  2002  . APPENDECTOMY    . [place]  . BRONCHOSCOPY  2001  . CHOLECYSTECTOMY  1984  . KNEE SURGERY Bilateral 08/27/2010 (L) and 01/11/2011 (R)  . [place]  . OVARIAN CYST SURGERY  1968  . thymus tumor    . 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  TONSILLECTOMY      Allergies  Allergen Reactions  . Penicillins Hives, Itching, Swelling and Rash  . Statins Other (See Comments)    Myopathy, transaminitis  . Sulfamethoxazole-Trimethoprim Itching    Outpatient Encounter Medications as of 10/05/2017  Medication Sig  . albuterol (PROVENTIL HFA;VENTOLIN HFA) 108 (90 Base) MCG/ACT inhaler Inhale 2 puffs into the lungs every 6 (six) hours as needed for wheezing or shortness of breath.  Marland Kitchen alendronate (FOSAMAX) 70 MG tablet Take 1 tablet (70 mg total) by mouth once a week. Take with a full glass of water on an empty stomach.  Marland Kitchen amLODipine (NORVASC) 10 MG tablet Take 1 tablet (10 mg total) by mouth daily. Please schedule appointment for refills.  Marland Kitchen aspirin EC 81 MG tablet Take 81 mg by mouth daily as needed.   . B-D UF III MINI PEN NEEDLES 31G X 5 MM MISC USE AS DIRECTED  . folic acid (FOLVITE) 1 MG tablet Take 2 tablets (2 mg total) by mouth daily.  . furosemide (LASIX) 20 MG tablet TAKE 1 TABLET BY MOUTH TWICE DAILY AS NEEDED FOR 3 POUND WEIGHT GAIN IN ONE DAY OR 5 POUNDS IN ONE WEEK  . gabapentin (NEURONTIN) 600 MG tablet TAKE 1 TABLET BY MOUTH THREE TIMES DAILY  . glucose blood (ACCU-CHEK SMARTVIEW) test strip Check blood sugar 3-4 times daily  . HUMALOG KWIKPEN 200 UNIT/ML SOPN INJECT 20 UNITS INTO THE SKIN THREE TIMES DAILY BEFORE MEALS  . HYDROcodone-acetaminophen (NORCO) 10-325 MG tablet Take 1 tablet by mouth every 4 (four) hours as needed for moderate pain.  Marland Kitchen levothyroxine (SYNTHROID, LEVOTHROID) 137 MCG tablet TAKE 1 TABLET BY MOUTH DAILY BEFORE BREAKFAST ON AN EMPTY STOMACH( LABS OVERDUE)  . lisinopril (PRINIVIL,ZESTRIL) 20 MG tablet TAKE 1 TABLET(20 MG) BY MOUTH DAILY  . loperamide (IMODIUM A-D) 2 MG tablet Take 2 mg by mouth 4 (four) times daily as needed for diarrhea or loose stools.  . magnesium oxide (MAG-OX) 400 MG tablet Take 400 mg by mouth daily as needed.  . Melatonin 10 MG CAPS Take 1 capsule by mouth at bedtime  .  metoprolol tartrate (LOPRESSOR) 50 MG tablet   . mirabegron ER (MYRBETRIQ) 50 MG TB24 tablet Take 1 tablet (50 mg total) by mouth daily. (Patient not taking: Reported on 10/05/2017)  . nystatin (MYCOSTATIN) 100000 UNIT/ML suspension Take 5 mLs (500,000 Units total) by mouth 4 (four) times daily.  . NYSTATIN powder APPLY 1 GRAM TOPICALLY TO THE AFFECTED AREA DAILY AS NEEDED FOR ITCHING  . omega-3 acid ethyl esters (LOVAZA) 1 g capsule TAKE ONE CAPSULE BY MOUTH EVERY DAY  . oxyCODONE (OXY IR/ROXICODONE) 5 MG immediate release tablet Take one tablet every 6 hours as needed for severe pain, Take 2 tablets at bedtime.  Marland Kitchen  polyvinyl alcohol (LIQUIFILM TEARS) 1.4 % ophthalmic solution Place 1 drop into both eyes as needed (for dry eyes).   . potassium chloride (MICRO-K) 10 MEQ CR capsule Take 2 capsules by mouth 2 (two) times daily. While on lasix  . promethazine (PHENERGAN) 12.5 MG tablet TAKE 1 TABLET BY MOUTH AS NEEDED FOR SEVERE NAUSEA AS DIRECTED  . TOUJEO SOLOSTAR 300 UNIT/ML SOPN INJECT 100 UNITS UNDER THE SKIN EVERY NIGHT AT BEDTIME  . triamterene-hydrochlorothiazide (MAXZIDE-25) 37.5-25 MG tablet TAKE 1 TABLET BY MOUTH DAILY  . umeclidinium-vilanterol (ANORO ELLIPTA) 62.5-25 MCG/INH AEPB Inhale 1 puff into the lungs daily.  Marland Kitchen venlafaxine XR (EFFEXOR-XR) 75 MG 24 hr capsule TAKE 1 CAPSULE(75 MG) BY MOUTH DAILY WITH BREAKFAST  . VICTOZA 18 MG/3ML SOPN ADMINISTER 1.8 MG UNDER THE SKIN DAILY FOR BLOOD SUGAR  . XELJANZ 5 MG TABS TAKE 1 TABLET BY MOUTH TWICE DAILY   No facility-administered encounter medications on file as of 10/05/2017.     Review of Systems:  Review of Systems  Constitutional: Positive for malaise/fatigue. Negative for chills and fever.  HENT: Negative for congestion.        Fluid behind right ear  Eyes: Negative for blurred vision.  Respiratory: Negative for cough and shortness of breath.   Cardiovascular: Negative for chest pain and palpitations.  Gastrointestinal: Negative  for abdominal pain, blood in stool, constipation and melena.  Genitourinary: Positive for dysuria, frequency and urgency. Negative for hematuria.  Musculoskeletal: Positive for back pain and joint pain.  Skin: Negative for itching and rash.  Neurological: Positive for tingling and sensory change. Negative for dizziness and weakness.  Endo/Heme/Allergies: Bruises/bleeds easily.  Psychiatric/Behavioral: Positive for depression. Negative for memory loss. The patient has insomnia. The patient is not nervous/anxious.     Health Maintenance  Topic Date Due  . OPHTHALMOLOGY EXAM  12/20/1950  . FOOT EXAM  09/27/2016  . HEMOGLOBIN A1C  08/23/2017  . INFLUENZA VACCINE  08/22/2019 (Originally 03/22/2017)  . DEXA SCAN  09/20/2025  . TETANUS/TDAP  05/28/2026  . PNA vac Low Risk Adult  Completed    Physical Exam: Vitals:   10/05/17 1033  BP: (!) 158/88  Pulse: 82  Temp: 99 F (37.2 C)  TempSrc: Oral  SpO2: 97%  Weight: 258 lb (117 kg)  Height: 5\' 6"  (1.676 m)   Body mass index is 41.64 kg/m. Physical Exam  Constitutional: She is oriented to person, place, and time. She appears well-developed and well-nourished. No distress.  HENT:  Head: Normocephalic and atraumatic.  Right Ear: External ear normal.  Left Ear: External ear normal.  Slight dullness of right eardrum  Cardiovascular: Normal rate, regular rhythm, normal heart sounds and intact distal pulses.  Pulmonary/Chest: Effort normal and breath sounds normal. No respiratory distress.  Abdominal: Bowel sounds are normal.  Musculoskeletal: Normal range of motion.  Neurological: She is alert and oriented to person, place, and time.  Skin: Skin is warm and dry. Capillary refill takes less than 2 seconds.  Psychiatric: She has a normal mood and affect. Her behavior is normal. Judgment and thought content normal.    Labs reviewed: Basic Metabolic Panel: Recent Labs    02/20/17 1150 04/27/17 1033 09/08/17 1015  NA 139 141 140  K  4.3 4.4 5.0  CL 99 101 101  CO2 28 31 30   GLUCOSE 146* 144* 153*  BUN 16 17 19   CREATININE 0.81 0.91 0.95*  CALCIUM 9.6 9.5 9.8  TSH 1.43  --   --  Liver Function Tests: Recent Labs    10/06/16 0835 12/29/16 1021 02/20/17 1150 04/27/17 1033 09/08/17 1015  AST 37* 44* 39* 44* 33  ALT 33* 26 32* 28 20  ALKPHOS 100 89 113  --   --   BILITOT 0.4 0.4 0.5 0.4 0.4  PROT 6.7 6.6 7.0 6.8 6.7  ALBUMIN 3.7 3.8 3.9  --   --    No results for input(s): LIPASE, AMYLASE in the last 8760 hours. No results for input(s): AMMONIA in the last 8760 hours. CBC: Recent Labs    02/20/17 1150 04/27/17 1033 09/08/17 1015  WBC 15.7* 13.8* 14.9*  NEUTROABS 11,461* 10,226* 10,862*  HGB 14.2 14.1 13.9  HCT 43.9 43.2 42.8  MCV 85.7 84.5 82.9  PLT 321 302 292   Lipid Panel: No results for input(s): CHOL, HDL, LDLCALC, TRIG, CHOLHDL, LDLDIRECT in the last 8760 hours. Lab Results  Component Value Date   HGBA1C 8.2 (H) 02/20/2017    Assessment/Plan 1. Type 2 diabetes mellitus with diabetic polyneuropathy, with long-term current use of insulin (HCC) -cont current diabetes mgt -d/c gabapentin 600mg  po bid, try lyrica instead for diabetic neuropathy, fibromyalgia and spinal stenosis pains - pregabalin (LYRICA) 150 MG capsule; Take 1 capsule (150 mg total) by mouth 2 (two) times daily.  Dispense: 60 capsule; Refill: 3  2. Fibromyalgia - cont antidepressants and change gabapentin to lyrica as having severe radicular pains and muscular flair despite high doses of gabapentin (limited with renal function) - pregabalin (LYRICA) 150 MG capsule; Take 1 capsule (150 mg total) by mouth 2 (two) times daily.  Dispense: 60 capsule; Refill: 3  3. Chronic bilateral low back pain, with sciatica presence unspecified -see changes above, also will return to previous oxycodone when severe and hydrocodone for more moderate pain -has made herself an appt with Dr. Jannifer Franklin to address this pain also  4. Rheumatoid  arthritis involving multiple sites with positive rheumatoid factor (HCC) -follows with Dr. Kathee Delton  5. Overactive bladder -keep urology f/u as planned tomorrow  6. Dysuria -ongoing, feels like there is something down there that does not belong, has urology visit tomorrow  Labs/tests ordered:  No orders of the defined types were placed in this encounter.  Next appt:  02/08/2018 med mgt  Kelena Garrow L. Gelene Recktenwald, D.O. [facility] [address], [place] [address] Cell Phone (Mon-Fri 8am-5pm):  [phone] On Call:  [phone] & follow prompts after 5pm & weekends Office Phone:  [phone] Office Fax:  [phone]

## 2017-10-05 NOTE — Addendum Note (Signed)
Addended by: Tyson Dense E on: 10/05/2017 11:02 AM   Modules accepted: Orders

## 2017-10-05 NOTE — Patient Instructions (Signed)
Katherine Delgado , Thank you for taking time to come for your Medicare Wellness Visit. I appreciate your ongoing commitment to your health goals. Please review the following plan we discussed and let me know if I can assist you in the future.   Screening recommendations/referrals: Colonoscopy excluded, you are age 77 Mammogram up to date, due 06/27/2018 Bone Density up to date Recommended yearly ophthalmology/optometry visit for glaucoma screening and checkup Recommended yearly dental visit for hygiene and checkup  Vaccinations: Influenza vaccine excluded Pneumococcal vaccine 23 given today, up to date Tdap vaccine up to date, due 05/28/2026 Shingles vaccine due    Advanced directives: Please bring [place] a copy of your living will and health care power of [place]  Conditions/risks identified: none  Next appointment: Tyson Dense, RN 10/08/2018 @ 10:45am   Preventive Care 65 Years and Older, Female Preventive care refers to lifestyle choices and visits with your health care provider that can promote health and wellness. What does preventive care include?  A yearly physical exam. This is also called an annual well check.  Dental exams once or twice a year.  Routine eye exams. Ask your health care provider how often you should have your eyes checked.  Personal lifestyle choices, including:  Daily care of your teeth and gums.  Regular physical activity.  Eating a healthy diet.  Avoiding tobacco and drug use.  Limiting alcohol use.  Practicing safe sex.  Taking low-dose aspirin every day.  Taking vitamin and mineral supplements as recommended by your health care provider. What happens during an annual well check? The services and screenings done by your health care provider during your annual well check will depend on your age, overall health, lifestyle risk factors, and family history of disease. Counseling  Your health care provider may ask you questions about  your:  Alcohol use.  Tobacco use.  Drug use.  Emotional well-being.  Home and relationship well-being.  Sexual activity.  Eating habits.  History of falls.  Memory and ability to understand (cognition).  Work and work Statistician.  Reproductive health. Screening  You may have the following tests or measurements:  Height, weight, and BMI.  Blood pressure.  Lipid and cholesterol levels. These may be checked every 5 years, or more frequently if you are over 59 years old.  Skin check.  Lung cancer screening. You may have this screening every year starting at age 9 if you have a 30-pack-year history of smoking and currently smoke or have quit within the past 15 years.  Fecal occult blood test (FOBT) of the stool. You may have this test every year starting at age 71.  Flexible sigmoidoscopy or colonoscopy. You may have a sigmoidoscopy every 5 years or a colonoscopy every 10 years starting at age 54.  Hepatitis C blood test.  Hepatitis B blood test.  Sexually transmitted disease (STD) testing.  Diabetes screening. This is done by checking your blood sugar (glucose) after you have not eaten for a while (fasting). You may have this done every 1-3 years.  Bone density scan. This is done to screen for osteoporosis. You may have this done starting at age 55.  Mammogram. This may be done every 1-2 years. Talk to your health care provider about how often you should have regular mammograms. Talk with your health care provider about your test results, treatment options, and if necessary, the need for more tests. Vaccines  Your health care provider may recommend certain vaccines, such as:  Influenza vaccine. This is  recommended every year.  Tetanus, diphtheria, and acellular pertussis (Tdap, Td) vaccine. You may need a Td booster every 10 years.  Zoster vaccine. You may need this after age 77.  Pneumococcal 13-valent conjugate (PCV13) vaccine. One dose is recommended  after age 1.  Pneumococcal polysaccharide (PPSV23) vaccine. One dose is recommended after age 55. Talk to your health care provider about which screenings and vaccines you need and how often you need them. This information is not intended to replace advice given to you by your health care provider. Make sure you discuss any questions you have with your health care provider. Document Released: 09/04/2015 Document Revised: 04/27/2016 Document Reviewed: 06/09/2015 Elsevier Interactive Patient Education  2017 [place] Prevention in the Home Falls can cause injuries. They can happen to people of all ages. There are many things you can do to make your home safe and to help prevent falls. What can I do on the outside of my home?  Regularly fix the edges of walkways and driveways and fix any cracks.  Remove anything that might make you trip as you walk through a door, such as a raised step or threshold.  Trim any bushes or trees on the path to your home.  Use bright outdoor lighting.  Clear any walking paths of anything that might make someone trip, such as rocks or tools.  Regularly check to see if handrails are loose or broken. Make sure that both sides of any steps have handrails.  Any raised decks and porches should have guardrails on the edges.  Have any leaves, snow, or ice cleared regularly.  Use sand or salt on walking paths during winter.  Clean up any spills in your garage right away. This includes oil or grease spills. What can I do in the bathroom?  Use night lights.  Install grab bars by the toilet and in the tub and shower. Do not use towel bars as grab bars.  Use non-skid mats or decals in the tub or shower.  If you need to sit down in the shower, use a plastic, non-slip stool.  Keep the floor dry. Clean up any water that spills on the floor as soon as it happens.  Remove soap buildup in the tub or shower regularly.  Attach bath mats securely with  double-sided non-slip rug tape.  Do not have throw rugs and other things on the floor that can make you trip. What can I do in the bedroom?  Use night lights.  Make sure that you have a light by your bed that is easy to reach.  Do not use any sheets or blankets that are too big for your bed. They should not hang down onto the floor.  Have a firm chair that has side arms. You can use this for support while you get dressed.  Do not have throw rugs and other things on the floor that can make you trip. What can I do in the kitchen?  Clean up any spills right away.  Avoid walking on wet floors.  Keep items that you use a lot in easy-to-reach places.  If you need to reach something above you, use a strong step stool that has a grab bar.  Keep electrical cords out of the way.  Do not use floor polish or wax that makes floors slippery. If you must use wax, use non-skid floor wax.  Do not have throw rugs and other things on the floor that can make you trip.  What can I do with my stairs?  Do not leave any items on the stairs.  Make sure that there are handrails on both sides of the stairs and use them. Fix handrails that are broken or loose. Make sure that handrails are as long as the stairways.  Check any carpeting to make sure that it is firmly attached to the stairs. Fix any carpet that is loose or worn.  Avoid having throw rugs at the top or bottom of the stairs. If you do have throw rugs, attach them to the floor with carpet tape.  Make sure that you have a light switch at the top of the stairs and the bottom of the stairs. If you do not have them, ask someone to add them for you. What else can I do to help prevent falls?  Wear shoes that:  Do not have high heels.  Have rubber bottoms.  Are comfortable and fit you well.  Are closed at the toe. Do not wear sandals.  If you use a stepladder:  Make sure that it is fully opened. Do not climb a closed stepladder.  Make  sure that both sides of the stepladder are locked into place.  Ask someone to hold it for you, if possible.  Clearly mark and make sure that you can see:  Any grab bars or handrails.  First and last steps.  Where the edge of each step is.  Use tools that help you move around (mobility aids) if they are needed. These include:  Canes.  Walkers.  Scooters.  Crutches.  Turn on the lights when you go into a dark area. Replace any light bulbs as soon as they burn out.  Set up your furniture so you have a clear path. Avoid moving your furniture around.  If any of your floors are uneven, fix them.  If there are any pets around you, be aware of where they are.  Review your medicines with your doctor. Some medicines can make you feel dizzy. This can increase your chance of falling. Ask your doctor what other things that you can do to help prevent falls. This information is not intended to replace advice given to you by your health care provider. Make sure you discuss any questions you have with your health care provider. Document Released: 06/04/2009 Document Revised: 01/14/2016 Document Reviewed: 09/12/2014 Elsevier Interactive Patient Education  2017 [employer].

## 2017-10-05 NOTE — Progress Notes (Signed)
Subjective:   Katherine Delgado is a 77 y.o. female who presents for Medicare Annual (Subsequent) preventive examination.  Last AWV-07/07/2016       Objective:     Vitals: BP (!) 158/88 (BP Location: Left Arm, Patient Position: Sitting)   Pulse 82   Temp 99 F (37.2 C) (Oral)   Ht 5\' 6"  (1.676 m)   Wt 258 lb (117 kg)   SpO2 94%   BMI 41.64 kg/m   Body mass index is 41.64 kg/m.  Advanced Directives 10/05/2017 02/20/2017 11/21/2016 08/29/2016 07/07/2016 06/14/2016 05/28/2016  Does Patient Have a Medical Advance Directive? Yes Yes Yes Yes Yes Yes Yes  Type of Paramedic of [place];Living will Out of facility DNR (pink MOST or yellow form) Out of facility DNR (pink MOST or yellow form) Out of facility DNR (pink MOST or yellow form) - Living will Living will  Does patient want to make changes to medical advance directive? No - Patient declined - - - - No - Patient declined No - Patient declined  Copy of [place] in Chart? No - copy requested - - - Yes No - copy requested No - copy requested  Would patient like information on creating a medical advance directive? - - - - - - -  Pre-existing out of facility DNR order (yellow form or pink MOST form) - Pink MOST form placed in chart (order not valid for inpatient use) Pink MOST form placed in chart (order not valid for inpatient use) Pink MOST form placed in chart (order not valid for inpatient use) - - -    Tobacco Social History   Tobacco Use  Smoking Status Former Smoker  . Types: Cigarettes  . Last attempt to quit: 12/07/1979  . Years since quitting: 37.8  Smokeless Tobacco Never Used     Counseling given: Not Answered   Clinical Intake:  Pre-visit preparation completed: No  Pain : 0-10 Pain Score: 10-Worst pain ever Pain Type: Chronic pain Pain Location: Back Pain Orientation: Right, Left Pain Radiating Towards: hips and legs Pain Descriptors / Indicators: Aching Pain Onset: More  than a month ago Pain Frequency: Constant     Nutritional Status: BMI > 30  Obese Nutritional Risks: None Diabetes: Yes CBG done?: No Did pt. bring in CBG monitor from home?: No  How often do you need to have someone help you when you read instructions, pamphlets, or other written materials from your doctor or pharmacy?: 1 - Never What is the last grade level you completed in school?: [employer]  Interpreter Needed?: No  Information entered by :: Zenia Resides  Past Medical History:  Diagnosis Date  . Abnormal weight gain   . Abnormality of gait 04/19/2016  . Acute infective polyneuritis ([place]) 2002  . Acute maxillary sinusitis   . Acute sinusitis, unspecified   . Allergic rhinitis due to pollen   . Arthritis   . Back injury   . Candidiasis of skin and nails   . Candidiasis of vulva and vagina   . Chronic pain syndrome   . COPD (chronic obstructive pulmonary disease) ([place])   . Cough   . Degenerative arthritis   . Depressive disorder, not elsewhere classified   . Diabetes mellitus without complication ([place])   . Diaphragmatic hernia without mention of obstruction or gangrene   . Dyslipidemia   . Edema   . Fibromyalgia   . GERD (gastroesophageal reflux disease)   . Guillain-Barre syndrome ([place])   .  History of benign thymus tumor   . Hypertension   . Insomnia, unspecified   . Lumbago   . Miscarriage 1962  . Mixed hyperlipidemia   . Morbid obesity ([place])   . Obstructive chronic bronchitis with acute bronchitis ([place])   . Osteopenia, senile   . Osteoporosis   . Other malaise and fatigue   . Other specified disease of white blood cells   . Pain in joint, multiple sites   . Polyneuropathy in diabetes(357.2)   . RA (rheumatoid arthritis) ([place])   . Reflux esophagitis   . Rheumatoid arthritis(714.0)   . Shortness of breath   . Spinal stenosis, lumbar region, without neurogenic claudication   . Spondylosis, lumbosacral   . Spontaneous ecchymoses   . Stiffness of  joints, not elsewhere classified, multiple sites   . Tear film insufficiency, unspecified   . Thyroid disease   . Thyroid disorder   . Type II or unspecified type diabetes mellitus with peripheral circulatory disorders, uncontrolled(250.72)   . Unspecified chronic bronchitis ([place])   . Unspecified essential hypertension   . Unspecified hypothyroidism   . Unspecified pruritic disorder   . Unspecified urinary incontinence   . Urinary tract infection, site not specified    Past Surgical History:  Procedure Laterality Date  . ABDOMINAL HYSTERECTOMY  1974  . abdominal tumor  2002  . APPENDECTOMY    . [place]  . BRONCHOSCOPY  2001  . CHOLECYSTECTOMY  1984  . KNEE SURGERY Bilateral 08/27/2010 (L) and 01/11/2011 (R)  . [facility] [facility]  . OVARIAN CYST SURGERY  1968  . thymus tumor    . 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 TONSILLECTOMY     Family History  Problem Relation Age of Onset  . Alzheimer's disease Mother   . Heart disease Mother   . Heart disease Father   . Liver disease Father   . Breast cancer Sister   . Arrhythmia Sister   . Cancer Brother   . Colon polyps Brother   . Colon cancer Neg Hx   . Esophageal cancer Neg Hx   . Kidney disease Neg Hx   . Stomach cancer Neg Hx   . Rectal cancer Neg Hx    Social History   Socioeconomic History  . Marital status: Widowed    Spouse name: None  . Number of children: 2  . Years of education: None  . Highest education level: None  Social Needs  . Financial resource strain: Somewhat hard  . Food insecurity - worry: Sometimes true  . Food insecurity - inability: Sometimes true  . Transportation needs - medical: No  . Transportation needs - non-medical: No  Occupational History  . Occupation: Retired  Tobacco Use  . Smoking status: Former Smoker    Types: Cigarettes    Last attempt to quit: 12/07/1979    Years since quitting: 37.8  . Smokeless tobacco: Never Used  Substance and Sexual Activity  . Alcohol use: No     Alcohol/week: 0.0 oz  . Drug use: No  . Sexual activity: No  Other Topics Concern  . None  Social History Narrative   Walks with cane    Outpatient Encounter Medications as of 10/05/2017  Medication Sig  . albuterol (PROVENTIL HFA;VENTOLIN HFA) 108 (90 Base) MCG/ACT inhaler Inhale 2 puffs into the lungs every 6 (six) hours as needed for wheezing or shortness of breath.  Marland Kitchen alendronate (FOSAMAX) 70 MG tablet Take 1 tablet (70 mg total) by mouth  once a week. Take with a full glass of water on an empty stomach.  Marland Kitchen amLODipine (NORVASC) 10 MG tablet Take 1 tablet (10 mg total) by mouth daily. Please schedule appointment for refills.  Marland Kitchen aspirin EC 81 MG tablet Take 81 mg by mouth daily as needed.   . B-D UF III MINI PEN NEEDLES 31G X 5 MM MISC USE AS DIRECTED  . folic acid (FOLVITE) 1 MG tablet Take 2 tablets (2 mg total) by mouth daily.  . furosemide (LASIX) 20 MG tablet TAKE 1 TABLET BY MOUTH TWICE DAILY AS NEEDED FOR 3 POUND WEIGHT GAIN IN ONE DAY OR 5 POUNDS IN ONE WEEK  . gabapentin (NEURONTIN) 600 MG tablet TAKE 1 TABLET BY MOUTH THREE TIMES DAILY  . glucose blood (ACCU-CHEK SMARTVIEW) test strip Check blood sugar 3-4 times daily  . HUMALOG KWIKPEN 200 UNIT/ML SOPN INJECT 20 UNITS INTO THE SKIN THREE TIMES DAILY BEFORE MEALS  . HYDROcodone-acetaminophen (NORCO) 10-325 MG tablet Take 1 tablet by mouth every 4 (four) hours as needed for moderate pain.  Marland Kitchen levothyroxine (SYNTHROID, LEVOTHROID) 137 MCG tablet TAKE 1 TABLET BY MOUTH DAILY BEFORE BREAKFAST ON AN EMPTY STOMACH( LABS OVERDUE)  . lisinopril (PRINIVIL,ZESTRIL) 20 MG tablet TAKE 1 TABLET(20 MG) BY MOUTH DAILY  . loperamide (IMODIUM A-D) 2 MG tablet Take 2 mg by mouth 4 (four) times daily as needed for diarrhea or loose stools.  . magnesium oxide (MAG-OX) 400 MG tablet Take 400 mg by mouth daily as needed.  . Melatonin 10 MG CAPS Take 1 capsule by mouth at bedtime  . metoprolol tartrate (LOPRESSOR) 50 MG tablet   . nystatin (MYCOSTATIN)  100000 UNIT/ML suspension Take 5 mLs (500,000 Units total) by mouth 4 (four) times daily.  . NYSTATIN powder APPLY 1 GRAM TOPICALLY TO THE AFFECTED AREA DAILY AS NEEDED FOR ITCHING  . omega-3 acid ethyl esters (LOVAZA) 1 g capsule TAKE ONE CAPSULE BY MOUTH EVERY DAY  . oxyCODONE (OXY IR/ROXICODONE) 5 MG immediate release tablet Take one tablet every 6 hours as needed for severe pain, Take 2 tablets at bedtime.  . polyvinyl alcohol (LIQUIFILM TEARS) 1.4 % ophthalmic solution Place 1 drop into both eyes as needed (for dry eyes).   . potassium chloride (MICRO-K) 10 MEQ CR capsule Take 2 capsules by mouth 2 (two) times daily. While on lasix  . promethazine (PHENERGAN) 12.5 MG tablet TAKE 1 TABLET BY MOUTH AS NEEDED FOR SEVERE NAUSEA AS DIRECTED  . TOUJEO SOLOSTAR 300 UNIT/ML SOPN INJECT 100 UNITS UNDER THE SKIN EVERY NIGHT AT BEDTIME  . triamterene-hydrochlorothiazide (MAXZIDE-25) 37.5-25 MG tablet TAKE 1 TABLET BY MOUTH DAILY  . umeclidinium-vilanterol (ANORO ELLIPTA) 62.5-25 MCG/INH AEPB Inhale 1 puff into the lungs daily.  Marland Kitchen venlafaxine XR (EFFEXOR-XR) 75 MG 24 hr capsule TAKE 1 CAPSULE(75 MG) BY MOUTH DAILY WITH BREAKFAST  . VICTOZA 18 MG/3ML SOPN ADMINISTER 1.8 MG UNDER THE SKIN DAILY FOR BLOOD SUGAR  . XELJANZ 5 MG TABS TAKE 1 TABLET BY MOUTH TWICE DAILY  . mirabegron ER (MYRBETRIQ) 50 MG TB24 tablet Take 1 tablet (50 mg total) by mouth daily. (Patient not taking: Reported on 10/05/2017)  . [DISCONTINUED] lisinopril (PRINIVIL,ZESTRIL) 20 MG tablet Take 2 tablets (40 mg total) by mouth daily.   No facility-administered encounter medications on file as of 10/05/2017.     Activities of Daily Living In your present state of health, do you have any difficulty performing the following activities: 10/05/2017  Hearing? N  Vision? Y  Difficulty concentrating  or making decisions? N  Walking or climbing stairs? Y  Dressing or bathing? N  Doing errands, shopping? N  Preparing Food and eating ? N    Using the Toilet? N  In the past six months, have you accidently leaked urine? Y  Do you have problems with loss of bowel control? N  Managing your Medications? N  Managing your Finances? N  Housekeeping or managing your Housekeeping? N  Some recent data might be hidden    Patient Care Team: Gayland Curry, DO as PCP - General (Geriatric Medicine) Bo Merino, MD (Rheumatology) Al[place], Legrand Como, MD as Attending Physician (Endocrinology) Newt Minion, MD as Consulting Physician (Orthopedic Surgery)    Assessment:   This is a routine wellness examination for Katherine Delgado.  Exercise Activities and Dietary recommendations Current Exercise Habits: The patient does not participate in regular exercise at present, Exercise limited by: None identified  Goals    . Patient Stated     Patient would like to get back into church.    . Weight (lb) < 250 lb (113.4 kg)     Starting 07/07/16,  I will attempt to decrease my weight and get under 250 lbs.        Fall Risk Fall Risk  10/05/2017 06/01/2017 04/27/2017 11/21/2016 10/10/2016  Falls in the past year? No No No No No   Is the patient's home free of loose throw rugs in walkways, pet beds, electrical cords, etc?   yes      Grab bars in the bathroom? yes      Handrails on the stairs?   yes      Adequate lighting?   yes  Timed Get Up and Go performed: 20 seconds, fall risk  Depression Screen PHQ 2/9 Scores 10/05/2017 06/01/2017 04/27/2017 11/21/2016  PHQ - 2 Score 0 0 0 0     Cognitive Function MMSE - Mini Mental State Exam 10/05/2017 07/07/2016  Orientation to time 5 5  Orientation to Place 5 5  Registration 3 3  Attention/ Calculation 5 5  Recall 3 3  Language- name 2 objects 2 2  Language- repeat 1 1  Language- follow 3 step command 3 3  Language- read & follow direction 1 1  Write a sentence 1 1  Copy design 1 1  Total score 30 30        Immunization History  Administered Date(s) Administered  . Influenza-Unspecified  06/10/2011  . Pneumococcal Conjugate-13 07/07/2016  . Pneumococcal Polysaccharide-23 11/21/2003, 10/05/2017  . Tdap 05/28/2016    Qualifies for Shingles Vaccine?yes, educated  Screening Tests Health Maintenance  Topic Date Due  . OPHTHALMOLOGY EXAM  12/20/1950  . FOOT EXAM  09/27/2016  . HEMOGLOBIN A1C  08/23/2017  . INFLUENZA VACCINE  08/22/2019 (Originally 03/22/2017)  . DEXA SCAN  09/20/2025  . TETANUS/TDAP  05/28/2026  . PNA vac Low Risk Adult  Completed    Cancer Screenings: Lung: Low Dose CT Chest recommended if Age 20-80 years, 30 pack-year currently smoking OR have quit w/in 15years. Patient does not qualify. Breast:  Up to date on Mammogram? Yes   Up to date of Bone Density/Dexa? Yes Colorectal: up to date  Additional Screenings: Hepatitis B/HIV/Syphillis: not indicated Hepatitis C Screening: declined     Plan:    I have personally reviewed and addressed the Medicare Annual Wellness questionnaire and have noted the following in the patient's chart:  A. Medical and social history B. Use of alcohol, tobacco or illicit drugs  C.  Current medications and supplements D. Functional ability and status E.  Nutritional status F.  Physical activity G. Advance directives H. List of other physicians I.  Hospitalizations, surgeries, and ER visits in previous 12 months J.  [place] to include hearing, vision, cognitive, depression L. Referrals and appointments - C3  In addition, I have reviewed and discussed with patient certain preventive protocols, quality metrics, and best practice recommendations. A written personalized care plan for preventive services as well as general preventive health recommendations were provided to patient.  See attached scanned questionnaire for additional information.   Signed,   Tyson Dense, RN Nurse Health Advisor   Quick Notes   Health Maintenance: Pt is going to make appointment for eye exam. Pt declined flu and  shingrix. PNA 23 given today.  C3 referral for dental     Abnormal Screen: MMSE 30/30, passed clock drawing 1st Bp-164/88  2nd BP-158/88.      Patient Concerns: [place] in urine-she is seeing urologist tomorrow.  Back, hip, leg pain-on waiting list to see her neurologist     Nurse Concerns: none

## 2017-10-06 ENCOUNTER — Telehealth: Payer: Self-pay | Admitting: *Deleted

## 2017-10-06 DIAGNOSIS — R102 Pelvic and perineal pain: Secondary | ICD-10-CM | POA: Diagnosis not present

## 2017-10-06 DIAGNOSIS — G8929 Other chronic pain: Secondary | ICD-10-CM

## 2017-10-06 DIAGNOSIS — M545 Low back pain: Principal | ICD-10-CM

## 2017-10-06 DIAGNOSIS — N3281 Overactive bladder: Secondary | ICD-10-CM | POA: Diagnosis not present

## 2017-10-06 MED ORDER — HYDROCODONE-ACETAMINOPHEN 10-325 MG PO TABS: 1.0000 | ORAL_TABLET | ORAL | 0 refills | Status: DC | PRN

## 2017-10-06 NOTE — Telephone Encounter (Signed)
Patient called and stated that she received the Pneumonia Shot yesterday. Patient stated that her arm is aching, hurts, can't move it, sore and throbbing since she gotten it. Stated that this has never happened before when she's received shots. Last night it was warm and red to the touch, so she was putting Lidocaine cream on it and cold compresses.  Not red or warm to the touch today just very sore and can't move it. Please Advise.   Patient is out of pain medications because you cut them so she has only been taking the Tylenol. (Rx pended) for approval. [place]CC[place] Database Verified. Pharmacy Confirmed.

## 2017-10-06 NOTE — Telephone Encounter (Signed)
She is already doing the best things for her sore arm after the pneumonia vaccine.  Continue with cold compresses 20 mins 4 times per day and keeping arm elevated.  She should try to use the arm to keep the muscle loose and from tensing up on her.  I have sent the hydrocodone to her pharmacy back at the previous frequency as discussed at her appt yesterday.

## 2017-10-06 NOTE — Telephone Encounter (Signed)
Patient notified and agreed.  

## 2017-10-07 ENCOUNTER — Other Ambulatory Visit: Payer: Self-pay | Admitting: Internal Medicine

## 2017-10-11 ENCOUNTER — Telehealth: Payer: Self-pay | Admitting: Rheumatology

## 2017-10-11 NOTE — Telephone Encounter (Signed)
Patient left a voicemail stating she has information regarding her prior authorization for Xeljanz.

## 2017-10-11 NOTE — Telephone Encounter (Signed)
Patient left a voicemail returning your call.  °

## 2017-10-11 NOTE — Telephone Encounter (Signed)
Received a fax from [facility] regarding a prior authorization approval for XELJANZ through 08/11/2018.   Reference number: [id number] Phone number: [phone]  Will send document to scan center.  Brazil Voytko, [place], CPhT 3:00 PM

## 2017-10-11 NOTE — Telephone Encounter (Signed)
Returned pts call. Left message  Submitted a prior authorization for Morrie Sheldon via cover my meds. Will update once we receive a response.   Magdeline Prange, [place], CPhT 2:21 PM

## 2017-10-12 ENCOUNTER — Other Ambulatory Visit: Payer: Self-pay | Admitting: *Deleted

## 2017-10-12 DIAGNOSIS — M797 Fibromyalgia: Secondary | ICD-10-CM

## 2017-10-12 DIAGNOSIS — G9332 Myalgic encephalomyelitis/chronic fatigue syndrome: Secondary | ICD-10-CM

## 2017-10-12 DIAGNOSIS — R5382 Chronic fatigue, unspecified: Principal | ICD-10-CM

## 2017-10-12 MED ORDER — OXYCODONE HCL 5 MG PO TABS: ORAL_TABLET | ORAL | 0 refills | Status: DC

## 2017-10-12 NOTE — Telephone Encounter (Signed)
Patient requested refill NCCSRS Database Verified Pharmacy Confirmed.  Pended Rx and sent to Dr. Reed for approval.  

## 2017-10-16 NOTE — Telephone Encounter (Signed)
Returned call. Left voice message.  Raniyah Curenton, [place], CPhT 11:33 AM

## 2017-10-23 ENCOUNTER — Other Ambulatory Visit: Payer: Self-pay | Admitting: Internal Medicine

## 2017-11-02 ENCOUNTER — Other Ambulatory Visit: Payer: Self-pay | Admitting: *Deleted

## 2017-11-02 DIAGNOSIS — M545 Low back pain: Principal | ICD-10-CM

## 2017-11-02 DIAGNOSIS — G8929 Other chronic pain: Secondary | ICD-10-CM

## 2017-11-02 MED ORDER — HYDROCODONE-ACETAMINOPHEN 10-325 MG PO TABS: 1.0000 | ORAL_TABLET | ORAL | 0 refills | Status: DC | PRN

## 2017-11-02 NOTE — Telephone Encounter (Signed)
Patient requested refill.  [place] Database Verified.  LR 10/06/17 Pharmacy Confirmed Pended Rx and sent to Dr. Mariea Clonts for approval.

## 2017-11-06 ENCOUNTER — Other Ambulatory Visit: Payer: Self-pay | Admitting: Physician Assistant

## 2017-11-06 NOTE — Telephone Encounter (Signed)
Last visit: 01/26/2017 Next visit: 01/05/2018 Labs: 09/08/2017 white cell count is elevated. Patient states she does not have an infection.  Okay to refill per Dr. Estanislado Pandy

## 2017-11-09 ENCOUNTER — Other Ambulatory Visit: Payer: Self-pay | Admitting: *Deleted

## 2017-11-09 DIAGNOSIS — G9332 Myalgic encephalomyelitis/chronic fatigue syndrome: Secondary | ICD-10-CM

## 2017-11-09 DIAGNOSIS — M797 Fibromyalgia: Secondary | ICD-10-CM

## 2017-11-09 DIAGNOSIS — R5382 Chronic fatigue, unspecified: Principal | ICD-10-CM

## 2017-11-09 MED ORDER — OXYCODONE HCL 5 MG PO TABS: ORAL_TABLET | ORAL | 0 refills | Status: DC

## 2017-11-09 NOTE — Telephone Encounter (Signed)
Patient called and requested Narcotic NCCSRS Database Verified LR 10/12/2017 Pharmacy Confirmed Pended Rx and sent to Dr. Mariea Clonts for approval.

## 2017-11-12 ENCOUNTER — Other Ambulatory Visit: Payer: Self-pay | Admitting: Cardiology

## 2017-11-12 DIAGNOSIS — R0602 Shortness of breath: Secondary | ICD-10-CM

## 2017-11-20 ENCOUNTER — Ambulatory Visit (INDEPENDENT_AMBULATORY_CARE_PROVIDER_SITE_OTHER): Payer: Medicare Other | Admitting: Podiatry

## 2017-11-20 ENCOUNTER — Telehealth: Payer: Self-pay | Admitting: Internal Medicine

## 2017-11-20 ENCOUNTER — Encounter: Payer: Self-pay | Admitting: Podiatry

## 2017-11-20 VITALS — BP 157/77 | HR 77 | Temp 98.8°F | Resp 16

## 2017-11-20 DIAGNOSIS — L03032 Cellulitis of left toe: Secondary | ICD-10-CM | POA: Diagnosis not present

## 2017-11-20 DIAGNOSIS — L02612 Cutaneous abscess of left foot: Secondary | ICD-10-CM

## 2017-11-20 MED ORDER — CEPHALEXIN 500 MG PO CAPS: 500.0000 mg | ORAL_CAPSULE | Freq: Three times a day (TID) | ORAL | 1 refills | Status: DC

## 2017-11-20 NOTE — Telephone Encounter (Signed)
I left a message asking the pt to call me at [phone] about community resource referral for dental services. VDM (DD)

## 2017-11-20 NOTE — Progress Notes (Signed)
   Subjective:    Patient ID: Katherine Delgado, female    DOB: 28-Jan-1941, 77 y.o.   MRN: [id number]  HPI    Review of Systems  All other systems reviewed and are negative.      Objective:   Physical Exam        Assessment & Plan:

## 2017-11-21 NOTE — Progress Notes (Signed)
Subjective:   Patient ID: Katherine Delgado, female   DOB: 77 y.o.   MRN: [id number]   HPI Patient presents stating that she has had an ulcer of her left big toe in the past and she is concerned that it might be a problem again and she has had x-rays done in the past which have been negative and is not been draining but she wanted it checked.  Patient does not smoke and is not currently active   Review of Systems  All other systems reviewed and are negative.       Objective:  Physical Exam  Constitutional: She appears well-developed and well-nourished.  Cardiovascular: Intact distal pulses.  Pulmonary/Chest: Effort normal.  Musculoskeletal: Normal range of motion.  Neurological: She is alert.  Skin: Skin is warm.  Nursing note and vitals reviewed.   Neurovascular status was found to be intact muscle strength was adequate range of motion within normal limits with patient noted to have a area of irritation of the left plantar hallux measuring about 1 cm x 1 cm with approximately 1 mm of depth and no subcutaneous exposure.  Patient has no proximal edema erythema or drainage noted no odor emitting from this area and no indications of any form of systemic infection.  Patient has a callus on the left plantar first metatarsal that is not invasive and is simply just keratotic from pressure of bone.  Patient has good digital perfusion well oriented x3     Assessment:  Appears to be low-grade ulceration of the left hallux which is probably due to mild neuropathic-like symptoms long-term diabetes that is localized with no indications of proximal infection     Plan:  H&P and condition discussed at great length.  At this point using sharp sterile instrumentation I debrided the lesion I did not note any odor or any drainage currently I flushed the area I packed with Iodosorb applied padding around it with sterile dressing.  I gave instructions on home soaks and padding therapy of the area and I gave  strict instructions of any redness were to occur any increased drainage any odor or any systemic signs of infection that she is to go to the emergency room and contact us.  This should heal uneventfully as it has in the past and we will watch it and may need to be more aggressive if it does not heal properly

## 2017-11-30 ENCOUNTER — Other Ambulatory Visit: Payer: Self-pay | Admitting: Internal Medicine

## 2017-12-01 ENCOUNTER — Other Ambulatory Visit: Payer: Self-pay | Admitting: *Deleted

## 2017-12-01 DIAGNOSIS — M545 Low back pain: Principal | ICD-10-CM

## 2017-12-01 DIAGNOSIS — G8929 Other chronic pain: Secondary | ICD-10-CM

## 2017-12-01 MED ORDER — HYDROCODONE-ACETAMINOPHEN 10-325 MG PO TABS: 1.0000 | ORAL_TABLET | ORAL | 0 refills | Status: DC | PRN

## 2017-12-01 NOTE — Telephone Encounter (Signed)
Database checked and verified Last filled 11/03/2017

## 2017-12-07 ENCOUNTER — Other Ambulatory Visit: Payer: Self-pay | Admitting: Internal Medicine

## 2017-12-07 DIAGNOSIS — N3281 Overactive bladder: Secondary | ICD-10-CM

## 2017-12-11 ENCOUNTER — Other Ambulatory Visit: Payer: Self-pay | Admitting: *Deleted

## 2017-12-11 DIAGNOSIS — G9332 Myalgic encephalomyelitis/chronic fatigue syndrome: Secondary | ICD-10-CM

## 2017-12-11 DIAGNOSIS — M797 Fibromyalgia: Secondary | ICD-10-CM

## 2017-12-11 DIAGNOSIS — R5382 Chronic fatigue, unspecified: Principal | ICD-10-CM

## 2017-12-11 MED ORDER — OXYCODONE HCL 5 MG PO TABS: ORAL_TABLET | ORAL | 0 refills | Status: DC

## 2017-12-11 NOTE — Telephone Encounter (Signed)
Patient requested [place] Verified LR: 11/09/2017 Pharmacy Confirmed Pended Rx and sent to Dr. Mariea Clonts for approval.

## 2017-12-13 ENCOUNTER — Other Ambulatory Visit: Payer: Self-pay | Admitting: Rheumatology

## 2017-12-13 NOTE — Telephone Encounter (Signed)
Last visit: 01/26/2017 Next visit: 01/05/2018 Labs: 1/18/2019white cell count is elevated.Patient statesshe does not have an infection.  Left message to advise patient she is due to update lab.  Okay to refill 30 day supply per Dr. Estanislado Pandy

## 2017-12-15 ENCOUNTER — Other Ambulatory Visit: Payer: Self-pay | Admitting: *Deleted

## 2017-12-15 MED ORDER — NYSTATIN 100000 UNIT/GM EX POWD: Freq: Every day | CUTANEOUS | 1 refills | Status: DC | PRN

## 2017-12-18 ENCOUNTER — Other Ambulatory Visit: Payer: Self-pay | Admitting: Student

## 2017-12-18 ENCOUNTER — Other Ambulatory Visit: Payer: Self-pay | Admitting: Internal Medicine

## 2017-12-18 NOTE — Telephone Encounter (Signed)
REFILL 

## 2017-12-19 ENCOUNTER — Other Ambulatory Visit: Payer: Self-pay | Admitting: *Deleted

## 2017-12-21 ENCOUNTER — Encounter

## 2017-12-21 ENCOUNTER — Encounter: Payer: Self-pay | Admitting: Neurology

## 2017-12-21 ENCOUNTER — Other Ambulatory Visit: Payer: Self-pay

## 2017-12-21 ENCOUNTER — Ambulatory Visit (INDEPENDENT_AMBULATORY_CARE_PROVIDER_SITE_OTHER): Payer: Medicare Other | Admitting: Neurology

## 2017-12-21 VITALS — BP 145/68 | HR 72 | Ht 66.0 in | Wt 264.5 lb

## 2017-12-21 DIAGNOSIS — M79604 Pain in right leg: Secondary | ICD-10-CM | POA: Diagnosis not present

## 2017-12-21 DIAGNOSIS — E1142 Type 2 diabetes mellitus with diabetic polyneuropathy: Secondary | ICD-10-CM | POA: Diagnosis not present

## 2017-12-21 DIAGNOSIS — M79605 Pain in left leg: Secondary | ICD-10-CM

## 2017-12-21 MED ORDER — ALPRAZOLAM 0.5 MG PO TABS: ORAL_TABLET | ORAL | 0 refills | Status: DC

## 2017-12-21 NOTE — Progress Notes (Signed)
Reason for visit: Peripheral neuropathy, gait disturbance  Katherine Delgado is an 77 y.o. female  History of present illness:  Katherine Delgado is a 77 year old right-handed white female with a history of obesity and diabetes.  The patient has been found to have a peripheral neuropathy likely associated with the diabetes.  She was last seen through this office 2 years ago.  She returns today with a new problem.  She indicates that within the last 3 to 4 months she has had onset of some back pain and pain around the lower abdomen, she will have pain down both legs to the feet, the discomfort initially started on the right side but now involves both legs.  The patient indicates that she is unable to walk longer distances, she uses a quad cane for ambulation.  She fortunately has not fallen.  She does have chronic issues with bowel and bladder urgency and incontinence.  She feels that the legs are weak.  She does have improvement in the pain when she sits down or lies down.  She has a history of fibromyalgia as well.  She returns to this office for further evaluation.  Past Medical History:  Diagnosis Date  . Abnormal weight gain   . Abnormality of gait 04/19/2016  . Acute infective polyneuritis ([place]) 2002  . Acute maxillary sinusitis   . Acute sinusitis, unspecified   . Allergic rhinitis due to pollen   . Arthritis   . Back injury   . Candidiasis of skin and nails   . Candidiasis of vulva and vagina   . Chronic pain syndrome   . COPD (chronic obstructive pulmonary disease) ([place])   . Cough   . Degenerative arthritis   . Depressive disorder, not elsewhere classified   . Diabetes mellitus without complication ([place])   . Diaphragmatic hernia without mention of obstruction or gangrene   . Dyslipidemia   . Edema   . Fibromyalgia   . GERD (gastroesophageal reflux disease)   . Guillain-Barre syndrome ([place])   . History of benign thymus tumor   . Hypertension   . Insomnia, unspecified   . Lumbago    . Miscarriage 1962  . Mixed hyperlipidemia   . Morbid obesity ([place])   . Obstructive chronic bronchitis with acute bronchitis ([place])   . Osteopenia, senile   . Osteoporosis   . Other malaise and fatigue   . Other specified disease of white blood cells   . Pain in joint, multiple sites   . Polyneuropathy in diabetes(357.2)   . RA (rheumatoid arthritis) ([place])   . Reflux esophagitis   . Rheumatoid arthritis(714.0)   . Shortness of breath   . Spinal stenosis, lumbar region, without neurogenic claudication   . Spondylosis, lumbosacral   . Spontaneous ecchymoses   . Stiffness of joints, not elsewhere classified, multiple sites   . Tear film insufficiency, unspecified   . Thyroid disease   . Thyroid disorder   . Type II or unspecified type diabetes mellitus with peripheral circulatory disorders, uncontrolled(250.72)   . Unspecified chronic bronchitis ([place])   . Unspecified essential hypertension   . Unspecified hypothyroidism   . Unspecified pruritic disorder   . Unspecified urinary incontinence   . Urinary tract infection, site not specified     Past Surgical History:  Procedure Laterality Date  . ABDOMINAL HYSTERECTOMY  1974  . abdominal tumor  2002  . APPENDECTOMY    . [place]  . BRONCHOSCOPY  2001  .  CHOLECYSTECTOMY  1984  . KNEE SURGERY Bilateral 08/27/2010 (L) and 01/11/2011 (R)  . [place]  . OVARIAN CYST SURGERY  1968  . thymus tumor    . 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 TONSILLECTOMY      Family History  Problem Relation Age of Onset  . Alzheimer's disease Mother   . Heart disease Mother   . Heart disease Father   . Liver disease Father   . Breast cancer Sister   . Arrhythmia Sister   . Cancer Brother   . Colon polyps Brother   . Colon cancer Neg Hx   . Esophageal cancer Neg Hx   . Kidney disease Neg Hx   . Stomach cancer Neg Hx   . Rectal cancer Neg Hx     Social history:  reports that she quit smoking about 38 years ago. Her smoking use  included cigarettes. She has never used smokeless tobacco. She reports that she does not drink alcohol or use drugs.    Allergies  Allergen Reactions  . Penicillins Hives, Itching, Swelling and Rash  . Statins Other (See Comments)    Myopathy, transaminitis  . Sulfamethoxazole-Trimethoprim Itching    Medications:  Prior to Admission medications   Medication Sig Start Date End Date Taking? Authorizing Provider  albuterol (PROVENTIL HFA;VENTOLIN HFA) 108 (90 Base) MCG/ACT inhaler Inhale 2 puffs into the lungs every 6 (six) hours as needed for wheezing or shortness of breath. 06/05/17  Yes Reed, Tiffany L, DO  alendronate (FOSAMAX) 70 MG tablet Take 1 tablet (70 mg total) by mouth once a week. Take with a full glass of water on an empty stomach. 04/27/17  Yes Deveshwar, Abel Presto, MD  amLODipine (NORVASC) 10 MG tablet Take 1 tablet (10 mg total) by mouth daily. Please schedule appointment for refills. 09/25/17  Yes Lelon Perla, MD  aspirin EC 81 MG tablet Take 81 mg by mouth daily as needed.    Yes [provider]  B-D UF III MINI PEN NEEDLES 31G X 5 MM MISC USE AS DIRECTED 12/18/17  Yes Reed, Tiffany L, DO  cephALEXin (KEFLEX) 500 MG capsule Take 1 capsule (500 mg total) by mouth 3 (three) times daily. 11/20/17  Yes Regal, Tamala Fothergill, DPM  folic acid (FOLVITE) 1 MG tablet Take 2 tablets (2 mg total) by mouth daily. 11/15/16  Yes Panwala, Naitik, PA-C  furosemide (LASIX) 20 MG tablet TAKE 1 TABLET BY MOUTH TWICE DAILY AS NEEDED FOR 3 POUND WEIGHT GAIN IN ONE DAY OR 5 POUNDS IN ONE WEEK 08/18/17  Yes Reed, Tiffany L, DO  gabapentin (NEURONTIN) 600 MG tablet TAKE 1 TABLET BY MOUTH THREE TIMES DAILY 11/30/17  Yes Reed, Tiffany L, DO  glucose blood (ACCU-CHEK SMARTVIEW) test strip Check blood sugar 3-4 times daily   Yes [provider]  HUMALOG KWIKPEN 200 UNIT/ML SOPN INJECT 20 UNITS INTO THE SKIN THREE TIMES DAILY BEFORE MEALS 03/20/17  Yes Reed, Tiffany L, DO  HYDROcodone-acetaminophen  (NORCO) 10-325 MG tablet Take 1 tablet by mouth every 3 (three) hours as needed for moderate pain. 12/01/17  Yes Reed, Tiffany L, DO  levothyroxine (SYNTHROID, LEVOTHROID) 137 MCG tablet TAKE 1 TABLET BY MOUTH DAILY BEFORE BREAKFAST ON AN EMPTY STOMACH( LABS OVERDUE) 07/25/17  Yes Reed, Tiffany L, DO  lisinopril (PRINIVIL,ZESTRIL) 20 MG tablet Take 1 tablet (20 mg total) by mouth daily. NEED OV. 12/18/17  Yes Lelon Perla, MD  loperamide (IMODIUM A-D) 2 MG tablet Take 2 mg by mouth  4 (four) times daily as needed for diarrhea or loose stools.   Yes [provider]  magnesium oxide (MAG-OX) 400 MG tablet Take 400 mg by mouth daily as needed.   Yes [provider]  Melatonin 10 MG CAPS Take 1 capsule by mouth at bedtime   Yes [provider]  metoprolol tartrate (LOPRESSOR) 50 MG tablet  04/26/17  Yes [provider]  metoprolol tartrate (LOPRESSOR) 50 MG tablet TAKE 1 AND 1/2 TABLETS(75 MG) BY MOUTH TWICE DAILY 11/13/17  Yes Lelon Perla, MD  MYRBETRIQ 50 MG TB24 tablet TAKE 1 TABLET(50 MG) BY MOUTH DAILY 12/07/17  Yes Reed, Tiffany L, DO  nystatin (NYSTATIN) powder Apply topically daily as needed. 12/15/17  Yes Reed, Tiffany L, DO  omega-3 acid ethyl esters (LOVAZA) 1 g capsule TAKE ONE CAPSULE BY MOUTH EVERY DAY 08/04/16  Yes Reed, Tiffany L, DO  oxyCODONE (OXY IR/ROXICODONE) 5 MG immediate release tablet Take one tablet every 6 hours as needed for severe pain, Take 2 tablets at bedtime. 12/11/17  Yes Reed, Tiffany L, DO  polyvinyl alcohol (LIQUIFILM TEARS) 1.4 % ophthalmic solution Place 1 drop into both eyes as needed (for dry eyes).    Yes [provider]  potassium chloride (MICRO-K) 10 MEQ CR capsule Take 2 capsules by mouth 2 (two) times daily. While on lasix 07/20/16  Yes [provider]  pregabalin (LYRICA) 150 MG capsule Take 1 capsule (150 mg total) by mouth 2 (two) times daily. 10/05/17  Yes Reed, Tiffany L, DO  promethazine (PHENERGAN)  12.5 MG tablet TAKE 1 TABLET BY MOUTH AS NEEDED FOR SEVERE NAUSEA AS DIRECTED 12/19/16  Yes Reed, Tiffany L, DO  TOUJEO SOLOSTAR 300 UNIT/ML SOPN INJECT 100 UNITS UNDER THE SKIN EVERY NIGHT AT BEDTIME 09/18/17  Yes Reed, Tiffany L, DO  triamterene-hydrochlorothiazide (MAXZIDE-25) 37.5-25 MG tablet TAKE 1 TABLET BY MOUTH DAILY 12/18/17  Yes Reed, Tiffany L, DO  umeclidinium-vilanterol (ANORO ELLIPTA) 62.5-25 MCG/INH AEPB Inhale 1 puff into the lungs daily. 06/01/17  Yes Reed, Tiffany L, DO  venlafaxine XR (EFFEXOR-XR) 75 MG 24 hr capsule TAKE 1 CAPSULE(75 MG) BY MOUTH DAILY WITH BREAKFAST 10/09/17  Yes Reed, Tiffany L, DO  VICTOZA 18 MG/3ML SOPN ADMINISTER 1.8 MG UNDER THE SKIN DAILY FOR BLOOD SUGAR 11/30/17  Yes Reed, Tiffany L, DO  XELJANZ 5 MG TABS TAKE 1 TABLET BY MOUTH TWICE DAILY 12/13/17  Yes Deveshwar, Abel Presto, MD  ALPRAZolam Duanne Moron) 0.5 MG tablet Take 2 tablets approximately 45 minutes prior to the MRI study, take a third tablet if needed. 12/21/17   Kathrynn Ducking, MD    ROS:  Out of a complete 14 system review of symptoms, the patient complains only of the following symptoms, and all other reviewed systems are negative.  Decreased activity, fatigue, excessive sweating Facial swelling, difficulty swallowing Eye itching, loss of vision, blurred vision Cough, shortness of breath, chest tightness Chest pain, leg swelling, palpitations Heat intolerance, excessive thirst Swollen abdomen, abdominal pain, diarrhea, incontinence of the bowels Restless legs, insomnia, frequent waking Frequent infections Incontinence of the bladder, frequency of urination, urinary urgency Joint pain, joint swelling, back pain, aching muscles, muscle cramps, walking difficulty, neck pain, neck stiffness Itching Bruising easily Dizziness, weakness  Blood pressure (!) 145/68, pulse 72, height 5\' 6"  (1.676 m), weight 264 lb 8 oz (120 kg).  Physical Exam  General: The patient is alert and cooperative at the time  of the examination.  The patient is markedly obese.  Skin: 2+  edema below the knees is seen bilaterally.   Neurologic Exam  Mental status: The patient is alert and oriented x 3 at the time of the examination. The patient has apparent normal recent and remote memory, with an apparently normal attention span and concentration ability.   Cranial nerves: Facial symmetry is present. Speech is normal, no aphasia or dysarthria is noted. Extraocular movements are full. Visual fields are full.  Motor: The patient has good strength in all 4 extremities.  Sensory examination: Soft touch sensation is symmetric on the face, arms, and legs.  Coordination: The patient has good finger-nose-finger and heel-to-shin bilaterally.  Gait and station: The patient has a wide-based gait, the patient is able to walk independently but usually uses a quad cane.  Tandem gait is unsteady, she cannot perform this.  Romberg is negative.  Reflexes: Deep tendon reflexes are symmetric, but are depressed.   Assessment/Plan:  1.  Peripheral neuropathy  2.  Gait disturbance  3.  Back pain and bilateral leg pain  The patient has had onset over the last several months of some difficulty with back pain and pain down both legs.  We will set up MRI evaluation of the lumbar spine and consider an epidural steroid injection in the future.  The patient is on Lyrica primarily for pain, she also takes some gabapentin.  She has opiate medications if needed.  The patient will follow-up in 4 or 5 months.  Jill Alexanders MD 12/21/2017 2:53 PM  [facility] [address] [place], [place] [address]  Phone [phone] Fax [phone]

## 2017-12-22 NOTE — Progress Notes (Addendum)
Office Visit Note  Patient: Katherine Delgado             Date of Birth: 08/03/1941           MRN: [id number]             PCP: Gayland Curry, DO Referring: Gayland Curry, DO Visit Date: 01/05/2018 Occupation: @GUAROCC @    Subjective:  Pain in left knee and hands.   History of Present Illness: SUSA BONES is a 77 y.o. female history of seropositive rheumatoid arthritis osteoarthritis and Sjogren's.  She has been off [place] and methotrexate for almost a week now.  The medicines were stopped because she has an ulcer on her left great toe.  She has been seeing Dr. Paulla Dolly and is currently on doxycycline. She continues to have some discomfort in her knee joints in her bilateral hands.  He continues to have sicca symptoms.  She is currently not having any joint swelling.  He has been having lower back pain and lower extremity pain.  She states she was seen by neurologist Dr. Jannifer Franklin for evaluation of her lower back pain.  He plans to schedule some cortisone injection for her lower back.  She has MRI of her lumbar spine is scheduled for next week.  Activities of Daily Living:  Patient reports morning stiffness for 1 hour.   Patient Reports nocturnal pain.  Difficulty dressing/grooming: Denies Difficulty climbing stairs: Reports Difficulty getting out of chair: Reports Difficulty using hands for taps, buttons, cutlery, and/or writing: Denies   Review of Systems  Constitutional: Positive for fatigue. Negative for night sweats, weight gain and weight loss.  HENT: Positive for mouth dryness. Negative for mouth sores, trouble swallowing, trouble swallowing and nose dryness.   Eyes: Positive for dryness. Negative for pain, redness and visual disturbance.  Respiratory: Negative for cough, shortness of breath and difficulty breathing.   Cardiovascular: Negative for chest pain, palpitations, hypertension, irregular heartbeat and swelling in legs/feet.  Gastrointestinal: Negative for blood in stool,  constipation and diarrhea.  Endocrine: Negative for increased urination.  Genitourinary: Negative for vaginal dryness.  Musculoskeletal: Positive for arthralgias, joint pain and morning stiffness. Negative for joint swelling, myalgias, muscle weakness, muscle tenderness and myalgias.  Skin: Negative for color change, rash, hair loss, skin tightness, ulcers and sensitivity to sunlight.  Allergic/Immunologic: Negative for susceptible to infections.  Neurological: Negative for dizziness, memory loss, night sweats and weakness.  Hematological: Negative for swollen glands.  Psychiatric/Behavioral: Positive for sleep disturbance. Negative for depressed mood. The patient is not nervous/anxious.     PMFS History:  Patient Active Problem List   Diagnosis Date Noted  . Ulcer of toe of left foot, with necrosis of bone ([place]) 07/25/2016  . Rheumatoid arthritis with positive rheumatoid factor ([place]) 06/14/2016  . High risk medication use 06/14/2016  . Sjogren's syndrome ([place]) 06/14/2016  . Primary osteoarthritis of both knees 06/14/2016  . DDD (degenerative disc disease), lumbar 06/14/2016  . Hypothyroidism 06/14/2016  . Osteoporosis 06/14/2016  . Abnormality of gait 04/19/2016  . Type 2 diabetes mellitus with diabetic polyneuropathy, with long-term current use of insulin ([place]) 11/27/2015  . Severe obesity (BMI >= 40) ([place]) 04/21/2014  . Type II or unspecified type diabetes mellitus with peripheral circulatory disorders, uncontrolled(250.72) 09/12/2013  . Headache(784.0) 07/31/2013  . Sinus infection 07/31/2013  . Chronic low back pain 03/14/2013  . Insomnia 12/06/2012  . Nausea alone 12/06/2012  . Diarrhea 12/06/2012  . Urinary incontinence, urge 12/06/2012  . Lumbosacral  root lesions, not elsewhere classified 11/27/2012  . Diabetic polyneuropathy associated with type 2 diabetes mellitus ([place]) 11/27/2012  . EDEMA 05/04/2010  . YEAST INFECTION 05/03/2010  . Hyperlipidemia 05/03/2010  .  DEPRESSION 05/03/2010  . History of peripheral neuropathy 05/03/2010  . GUILLAIN-BARRE SYNDROME 05/03/2010  . Essential hypertension 05/03/2010  . ALLERGIC RHINITIS 05/03/2010  . PNEUMONIA 05/03/2010  . COPD (chronic obstructive pulmonary disease) ([place]) 05/03/2010  . GERD 05/03/2010  . Fibromyalgia 05/03/2010  . DYSPNEA 05/03/2010  . CHEST PAIN 05/03/2010    Past Medical History:  Diagnosis Date  . Abnormal weight gain   . Abnormality of gait 04/19/2016  . Acute infective polyneuritis ([place]) 2002  . Acute maxillary sinusitis   . Acute sinusitis, unspecified   . Allergic rhinitis due to pollen   . Arthritis   . Back injury   . Candidiasis of skin and nails   . Candidiasis of vulva and vagina   . Chronic pain syndrome   . COPD (chronic obstructive pulmonary disease) ([place])   . Cough   . Degenerative arthritis   . Depressive disorder, not elsewhere classified   . Diabetes mellitus without complication ([place])   . Diaphragmatic hernia without mention of obstruction or gangrene   . Dyslipidemia   . Edema   . Fibromyalgia   . GERD (gastroesophageal reflux disease)   . Guillain-Barre syndrome ([place])   . History of benign thymus tumor   . Hypertension   . Insomnia, unspecified   . Lumbago   . Miscarriage 1962  . Mixed hyperlipidemia   . Morbid obesity ([place])   . Obstructive chronic bronchitis with acute bronchitis ([place])   . Osteopenia, senile   . Osteoporosis   . Other malaise and fatigue   . Other specified disease of white blood cells   . Pain in joint, multiple sites   . Polyneuropathy in diabetes(357.2)   . RA (rheumatoid arthritis) ([place])   . Reflux esophagitis   . Rheumatoid arthritis(714.0)   . Shortness of breath   . Spinal stenosis, lumbar region, without neurogenic claudication   . Spondylosis, lumbosacral   . Spontaneous ecchymoses   . Stiffness of joints, not elsewhere classified, multiple sites   . Tear film insufficiency, unspecified   . Thyroid disease   .  Thyroid disorder   . Type II or unspecified type diabetes mellitus with peripheral circulatory disorders, uncontrolled(250.72)   . Unspecified chronic bronchitis ([place])   . Unspecified essential hypertension   . Unspecified hypothyroidism   . Unspecified pruritic disorder   . Unspecified urinary incontinence   . Urinary tract infection, site not specified     Family History  Problem Relation Age of Onset  . Alzheimer's disease Mother   . Heart disease Mother   . Heart disease Father   . Liver disease Father   . Cancer Brother   . Arthritis Son   . Colon polyps Brother   . Colon cancer Neg Hx   . Esophageal cancer Neg Hx   . Kidney disease Neg Hx   . Stomach cancer Neg Hx   . Rectal cancer Neg Hx    Past Surgical History:  Procedure Laterality Date  . ABDOMINAL HYSTERECTOMY  1974  . abdominal tumor  2002  . APPENDECTOMY    . [place]  . BRONCHOSCOPY  2001  . CHOLECYSTECTOMY  1984  . KNEE SURGERY Bilateral 08/27/2010 (L) and 01/11/2011 (R)  . [place]  . OVARIAN CYST SURGERY  1968  .  thymus tumor    . 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 TONSILLECTOMY     Social History   Social History Narrative   Walks with cane   Right handed    Caffeine use: Coffee (2 cups every morning)   Tea: sometimes   Soda: none     Objective: Vital Signs: BP 139/70 (BP Location: Right Arm, Patient Position: Sitting, Cuff Size: Normal)   Pulse 76   Resp 18   Ht 5' 6.5" (1.689 m)   Wt 265 lb (120.2 kg)   BMI 42.13 kg/m    Physical Exam  Constitutional: She is oriented to person, place, and time. She appears well-developed and well-nourished.  HENT:  Head: Normocephalic and atraumatic.  Eyes: Conjunctivae and EOM are normal.  Neck: Normal range of motion.  Cardiovascular: Normal rate, regular rhythm, normal heart sounds and intact distal pulses.  Pulmonary/Chest: Effort normal and breath sounds normal.  Abdominal: Soft. Bowel sounds are normal.  Musculoskeletal: She  exhibits edema.  Trace pitting edema over bilateral lower extremities up to mid calf.  Lymphadenopathy:    She has no cervical adenopathy.  Neurological: She is alert and oriented to person, place, and time.  Skin: Skin is warm and dry. Capillary refill takes less than 2 seconds.  Ulceration was noted on the left toe on the plantar aspect.  Psychiatric: She has a normal mood and affect. Her behavior is normal.  Nursing note and vitals reviewed.    Musculoskeletal Exam: C-spine thoracic lumbar spine good range of motion.  Shoulder joints elbow joints were in good range of motion.  Her left wrist joint is fused which has limited range of motion.  She has some synovitis over her MCPs and described below.  Hip joints and knee joints in good range of motion.  CDAI Exam: CDAI Homunculus Exam:   Tenderness:  Right hand: 3rd MCP RLE: tibiofemoral LLE: tibiofemoral  Swelling:  Right hand: 3rd MCP  Joint Counts:  CDAI Tender Joint count: 3 CDAI Swollen Joint count: 1  Global Assessments:  Patient Global Assessment: 5 Provider Global Assessment: 5  CDAI Calculated Score: 14    Investigation: No additional findings. Dec 27, 2017 CMP AST 42, CBC WBCs 17.3 Imaging: Dg Foot Complete Left  Result Date: 01/01/2018 Please see detailed radiograph report in office note.   Speciality Comments: No specialty comments available.    Procedures:  No procedures performed Allergies: Penicillins; Statins; and Sulfamethoxazole-trimethoprim   Assessment / Plan:     Visit Diagnoses: Rheumatoid arthritis involving multiple sites with positive rheumatoid factor (HCC) - Positive RF, positive anti-CCP.  She continues to have some arthralgias but no joint swelling.  She came off her medications a week ago.  High risk medication use - (Xeljanz 5 mg by mouth twice a day, MTX 0.6 ml sq qwk-on hold), folic acid 1mg  po qd, her Morrie Sheldon and methotrexate are on hold due to recent ulceration on her left  great toe.  Recent labs showed elevated WBC count.  I am concerned about her infection.  I will repeat CBC in 3 weeks.  Primary osteoarthritis of both knees-she continues to have chronic pain and discomfort.  Sjogren's syndrome with keratoconjunctivitis sicca (HCC)-over-the-counter products were discussed at length.  DDD (degenerative disc disease), lumbar-lower back pain persist.  Weight loss diet and exercise was discussed.  Age-related osteoporosis without current pathological fracture-followed up by her PCP.  Fibromyalgia-she continues to have some generalized pain and discomfort from fibromyalgia.  Ulcer of toe of left  foot, with necrosis of bone (HCC)-she has ulcer on her left great toe on the plantar aspect.  For which she has been seen Dr. Paulla Dolly.  Unless the ulcer is completely healed I would not be able to resume her immunosuppressive agents.  She is currently taking doxycycline which she will be finishing soon.  I have advised her to schedule a follow-up appointment with Dr. Paulla Dolly closely so the ulcer is completely healed and treated. Other medical problems are listed as follows:  History of depression  History of peripheral neuropathy  History of gastroesophageal reflux (GERD)  History of diabetes mellitus  History of COPD  History of Guillain-Barre syndrome  History of obesity  Other insomnia  History of hypertension    Orders: No orders of the defined types were placed in this encounter.  No orders of the defined types were placed in this encounter.   Face-to-face time spent with patient was 30 minutes. >50% of time was spent in counseling and coordination of care.  Follow-Up Instructions: Return in about 3 months (around 04/07/2018) for Rheumatoid arthritis, Osteoarthritis, Sjogren's, Osteoporosis.   Bo Merino, MD  Note - This record has been created using Editor, commissioning.  Chart creation errors have been sought, but may not always  have been  located. Such creation errors do not reflect on  the standard of medical care.

## 2017-12-27 ENCOUNTER — Other Ambulatory Visit: Payer: Self-pay

## 2017-12-27 DIAGNOSIS — E559 Vitamin D deficiency, unspecified: Secondary | ICD-10-CM

## 2017-12-27 DIAGNOSIS — Z79899 Other long term (current) drug therapy: Secondary | ICD-10-CM

## 2017-12-27 NOTE — Addendum Note (Signed)
Addended by: Nena Jordan on: 12/27/2017 01:50 PM   Modules accepted: Orders

## 2017-12-28 LAB — COMPLETE METABOLIC PANEL WITH GFR
AG Ratio: 1.4 (calc) (ref 1.0–2.5)
ALBUMIN MSPROF: 4 g/dL (ref 3.6–5.1)
ALKALINE PHOSPHATASE (APISO): 122 U/L (ref 33–130)
ALT: 23 U/L (ref 6–29)
AST: 42 U/L — AB (ref 10–35)
BILIRUBIN TOTAL: 0.3 mg/dL (ref 0.2–1.2)
BUN: 17 mg/dL (ref 7–25)
CHLORIDE: 101 mmol/L (ref 98–110)
CO2: 31 mmol/L (ref 20–32)
Calcium: 9.6 mg/dL (ref 8.6–10.4)
Creat: 0.9 mg/dL (ref 0.60–0.93)
GFR, Est African American: 71 mL/min/{1.73_m2} (ref >=60)
GFR, Est Non African American: 62 mL/min/{1.73_m2} (ref >=60)
GLUCOSE: 98 mg/dL (ref 65–99)
Globulin: 2.9 g/dL (calc) (ref 1.9–3.7)
Potassium: 4.5 mmol/L (ref 3.5–5.3)
Sodium: 141 mmol/L (ref 135–146)
Total Protein: 6.9 g/dL (ref 6.1–8.1)

## 2017-12-28 LAB — CBC WITH DIFFERENTIAL/PLATELET
BASOS ABS: 121 {cells}/uL (ref 0–200)
Basophils Relative: 0.7 %
EOS ABS: 381 {cells}/uL (ref 15–500)
Eosinophils Relative: 2.2 %
HCT: 43.6 % (ref 35.0–45.0)
Hemoglobin: 14.5 g/dL (ref 11.7–15.5)
Lymphs Abs: 2474 cells/uL (ref 850–3900)
MCH: 27.7 pg (ref 27.0–33.0)
MCHC: 33.3 g/dL (ref 32.0–36.0)
MCV: 83.4 fL (ref 80.0–100.0)
MONOS PCT: 6.9 %
MPV: 11.3 fL (ref 7.5–12.5)
Neutro Abs: 13131 cells/uL — ABNORMAL HIGH (ref 1500–7800)
Neutrophils Relative %: 75.9 %
PLATELETS: 316 10*3/uL (ref 140–400)
RBC: 5.23 10*6/uL — ABNORMAL HIGH (ref 3.80–5.10)
RDW: 13.1 % (ref 11.0–15.0)
TOTAL LYMPHOCYTE: 14.3 %
WBC mixed population: 1194 cells/uL — ABNORMAL HIGH (ref 200–950)
WBC: 17.3 10*3/uL — ABNORMAL HIGH (ref 3.8–10.8)

## 2017-12-28 LAB — VITAMIN D 25 HYDROXY (VIT D DEFICIENCY, FRACTURES): VIT D 25 HYDROXY: 30 ng/mL (ref 30–100)

## 2017-12-28 NOTE — Progress Notes (Signed)
She should take vitamin D 2000 units daily.

## 2017-12-28 NOTE — Progress Notes (Signed)
WBC count is elevated.  I am uncertain about the etiology of elevated WBC count.  She should hold off Katherine Delgado if she has any infection.  Please check if patient is on prednisone.  She should also see her PCP for elevated WBCs.

## 2017-12-29 ENCOUNTER — Telehealth: Payer: Self-pay | Admitting: *Deleted

## 2017-12-29 DIAGNOSIS — M545 Low back pain: Principal | ICD-10-CM

## 2017-12-29 DIAGNOSIS — G8929 Other chronic pain: Secondary | ICD-10-CM

## 2017-12-29 MED ORDER — HYDROCODONE-ACETAMINOPHEN 10-325 MG PO TABS: 1.0000 | ORAL_TABLET | ORAL | 0 refills | Status: DC | PRN

## 2017-12-29 NOTE — Telephone Encounter (Signed)
Patient requested [place] Verified LR: 12/01/2017 Pharmacy Confirmed Pended Rx and sent to Dr. Mariea Clonts for approval.

## 2017-12-29 NOTE — Telephone Encounter (Signed)
Who is managing the toe infection?   She should be seen.  She will need an ESR, CRP, repeat cbc and xray of her foot with the infected ulcer.  She has been having some degree of wbc count for years and hematology said to watch it for major increases.  I'm concerned she could have a bone infection in the toe.  I realize we xrayed the foot back in July and there was no osteomyelitis then, but it could be there now.

## 2017-12-29 NOTE — Telephone Encounter (Signed)
Spoke with patient and advised results, [place] is managing her foot infection, she will call them today for an appt

## 2017-12-29 NOTE — Telephone Encounter (Signed)
Patient called and stated that she had labwork done at Dr. Arlean Hopping office and her WBC was 17.3. Dr. Estanislado Pandy instructed patient to hold her Morrie Sheldon and follow up with her PCP. No appointment with you until 6/13. Patient stated she feels fine.  Patient wanted message sent instead of making appointment with Janett Billow.   Patient stated that she has been on an antibiotic for a big toe ulcer and will be finishing it up in a couple days so she did not understand why she would have a high WBC count. Please Advise.

## 2017-12-30 ENCOUNTER — Other Ambulatory Visit: Payer: Self-pay | Admitting: Internal Medicine

## 2018-01-01 ENCOUNTER — Encounter: Payer: Self-pay | Admitting: Podiatry

## 2018-01-01 ENCOUNTER — Ambulatory Visit (INDEPENDENT_AMBULATORY_CARE_PROVIDER_SITE_OTHER): Payer: Medicare Other | Admitting: Podiatry

## 2018-01-01 ENCOUNTER — Ambulatory Visit (INDEPENDENT_AMBULATORY_CARE_PROVIDER_SITE_OTHER): Payer: Medicare Other

## 2018-01-01 ENCOUNTER — Other Ambulatory Visit: Payer: Self-pay | Admitting: Podiatry

## 2018-01-01 VITALS — BP 117/57 | HR 70

## 2018-01-01 DIAGNOSIS — M79672 Pain in left foot: Secondary | ICD-10-CM

## 2018-01-01 DIAGNOSIS — L02612 Cutaneous abscess of left foot: Secondary | ICD-10-CM

## 2018-01-01 DIAGNOSIS — L03032 Cellulitis of left toe: Secondary | ICD-10-CM | POA: Diagnosis not present

## 2018-01-03 NOTE — Progress Notes (Signed)
Subjective:   Patient ID: Katherine Delgado, female   DOB: 77 y.o.   MRN: [id number]   HPI Patient presents with a reoccurrence of breakdown of tissue on the plantar aspect left hallux and states that it does not really hurt but is not really fully closed   ROS      Objective:  Physical Exam  Neurovascular status unchanged with patient found to have a keratotic lesion plantar aspect left hallux that also has a opening in the center measuring about 5 x 5 mm with 2 mm of depth.  It is local to this area there is no proximal edema erythema or drainage noted     Assessment:  Chronic ulceration plantar aspect left hallux     Plan:  Sterile debridement of lesion accomplished today and I went ahead flush the wound applied Iodosorb gave instructions for soaks and place patient in a wedge shoes so she will not bear any plantar weight on this area.  Reappoint 2 weeks and if any drainage odor proximal edema erythema were to occur she is to contact us immediately and will need to consider antibiotics again for other treatment and ultimately if this remained chronic it is possible amputation of the toe may be necessary

## 2018-01-05 ENCOUNTER — Encounter: Payer: Self-pay | Admitting: Rheumatology

## 2018-01-05 ENCOUNTER — Ambulatory Visit (INDEPENDENT_AMBULATORY_CARE_PROVIDER_SITE_OTHER): Payer: Medicare Other | Admitting: Rheumatology

## 2018-01-05 VITALS — BP 139/70 | HR 76 | Resp 18 | Ht 66.5 in | Wt 265.0 lb

## 2018-01-05 DIAGNOSIS — M170 Bilateral primary osteoarthritis of knee: Secondary | ICD-10-CM | POA: Diagnosis not present

## 2018-01-05 DIAGNOSIS — Z8639 Personal history of other endocrine, nutritional and metabolic disease: Secondary | ICD-10-CM | POA: Diagnosis not present

## 2018-01-05 DIAGNOSIS — Z8659 Personal history of other mental and behavioral disorders: Secondary | ICD-10-CM

## 2018-01-05 DIAGNOSIS — Z79899 Other long term (current) drug therapy: Secondary | ICD-10-CM | POA: Diagnosis not present

## 2018-01-05 DIAGNOSIS — Z8679 Personal history of other diseases of the circulatory system: Secondary | ICD-10-CM

## 2018-01-05 DIAGNOSIS — L97524 Non-pressure chronic ulcer of other part of left foot with necrosis of bone: Secondary | ICD-10-CM

## 2018-01-05 DIAGNOSIS — M810 Age-related osteoporosis without current pathological fracture: Secondary | ICD-10-CM

## 2018-01-05 DIAGNOSIS — Z8719 Personal history of other diseases of the digestive system: Secondary | ICD-10-CM

## 2018-01-05 DIAGNOSIS — Z8669 Personal history of other diseases of the nervous system and sense organs: Secondary | ICD-10-CM

## 2018-01-05 DIAGNOSIS — M0579 Rheumatoid arthritis with rheumatoid factor of multiple sites without organ or systems involvement: Secondary | ICD-10-CM

## 2018-01-05 DIAGNOSIS — M797 Fibromyalgia: Secondary | ICD-10-CM | POA: Diagnosis not present

## 2018-01-05 DIAGNOSIS — M3501 Sicca syndrome with keratoconjunctivitis: Secondary | ICD-10-CM

## 2018-01-05 DIAGNOSIS — M5136 Other intervertebral disc degeneration, lumbar region: Secondary | ICD-10-CM | POA: Diagnosis not present

## 2018-01-05 DIAGNOSIS — Z8709 Personal history of other diseases of the respiratory system: Secondary | ICD-10-CM

## 2018-01-05 DIAGNOSIS — M51369 Other intervertebral disc degeneration, lumbar region without mention of lumbar back pain or lower extremity pain: Secondary | ICD-10-CM

## 2018-01-05 DIAGNOSIS — G4709 Other insomnia: Secondary | ICD-10-CM

## 2018-01-05 NOTE — Patient Instructions (Signed)
Repeat CBC in 3 weeks.

## 2018-01-07 IMAGING — CR DG HIP (WITH OR WITHOUT PELVIS) 2-3V*R*
3 series · 3 of 3 positions shown · non-contrast
Comparison: None.

CLINICAL DATA: Pain without trauma.

EXAM:
DG HIP (WITH OR WITHOUT PELVIS) 2-3V RIGHT

[t pelvis ap]
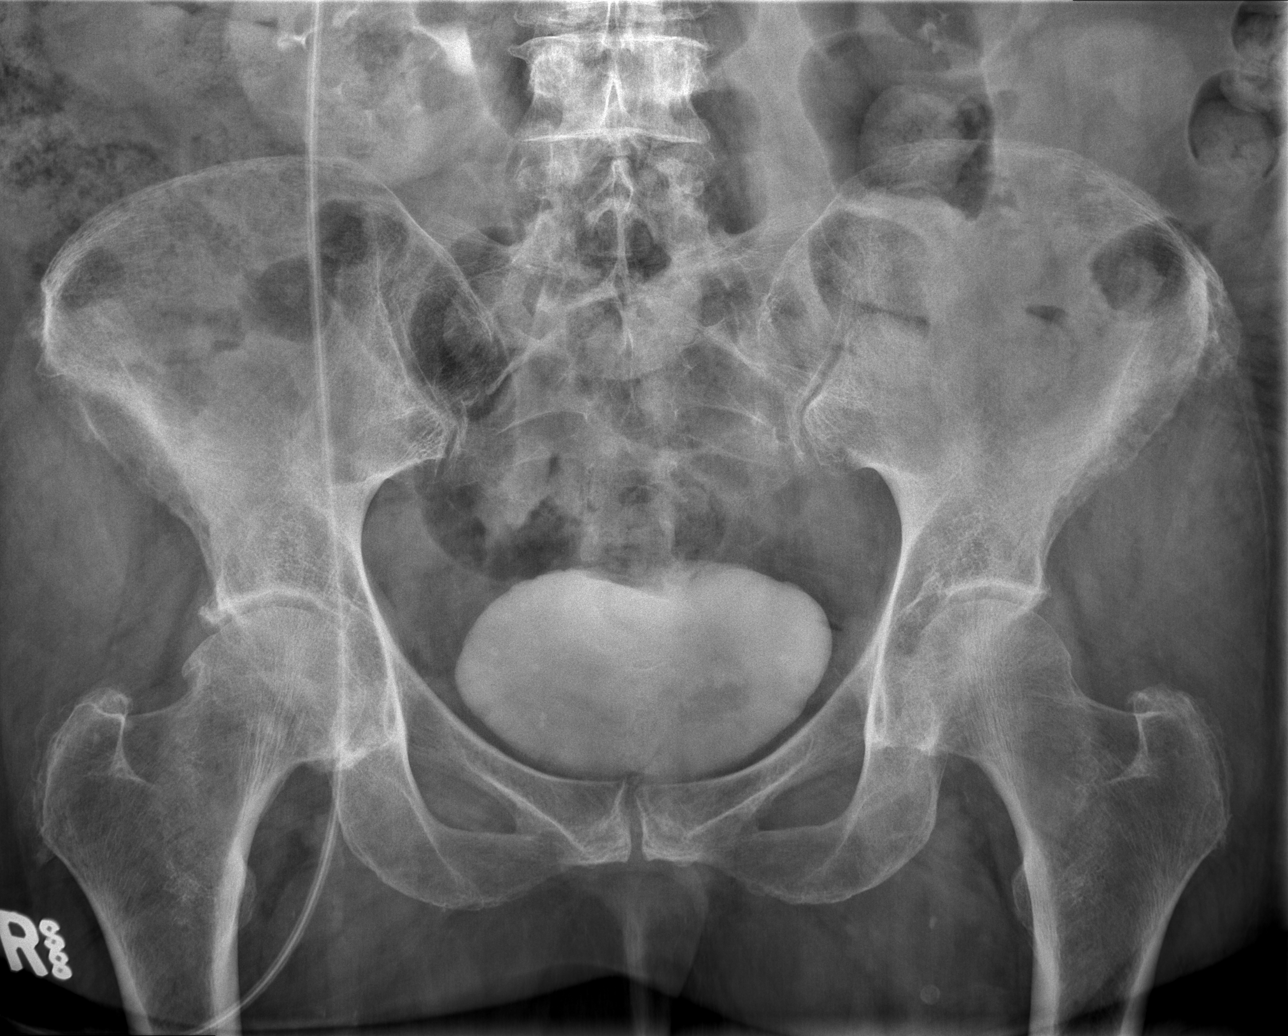

[t hip ap right]
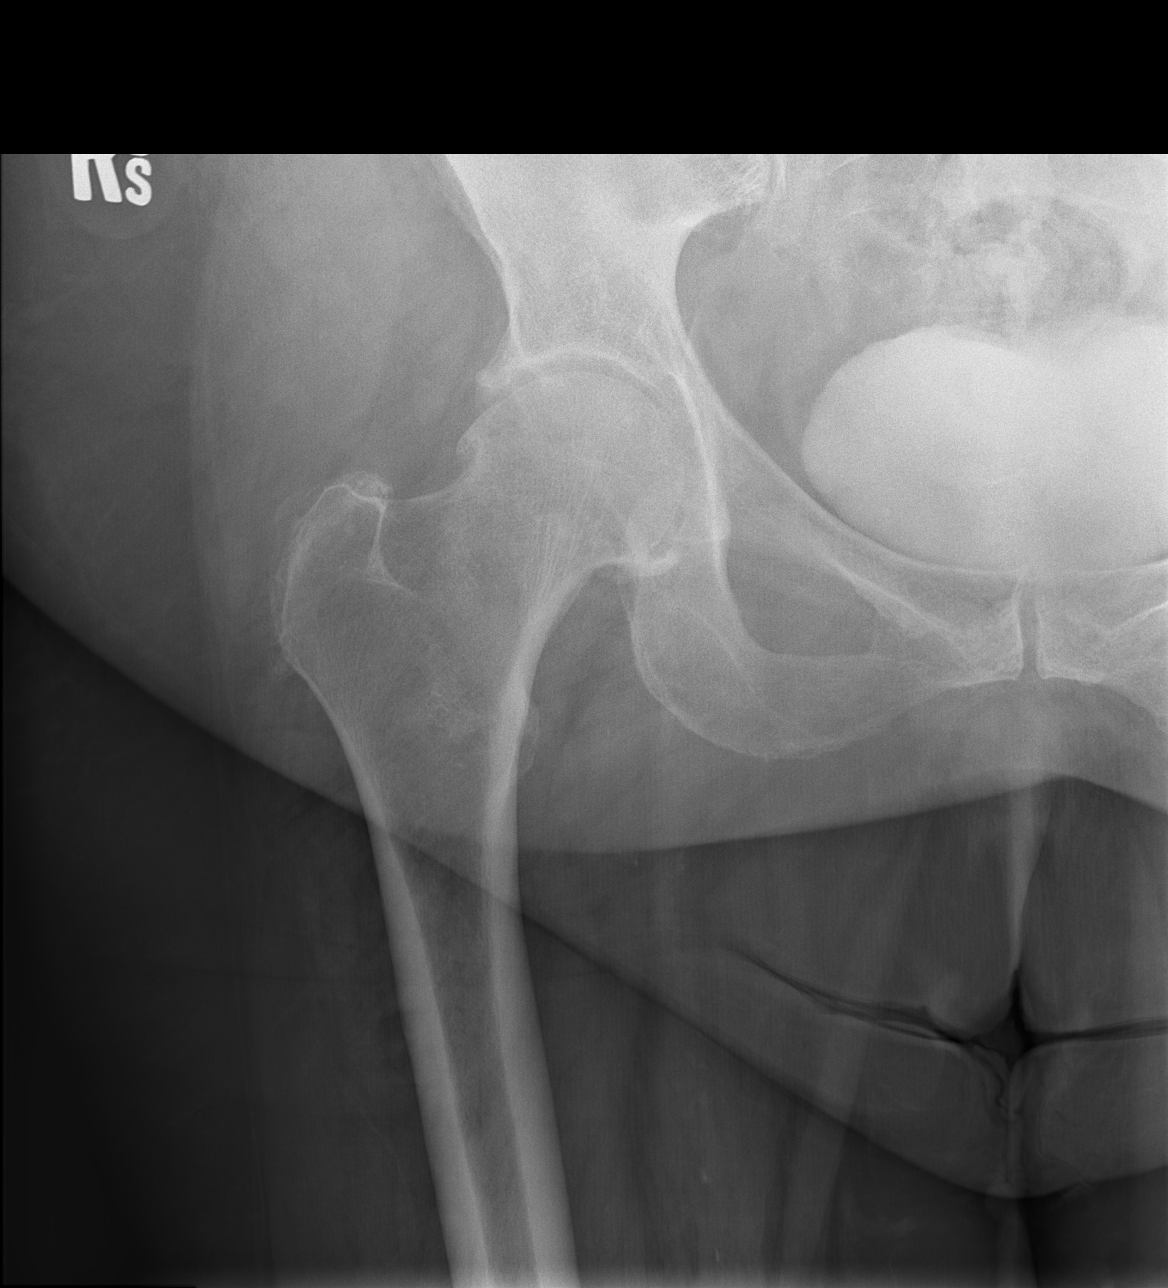

[t hip frog leg right]
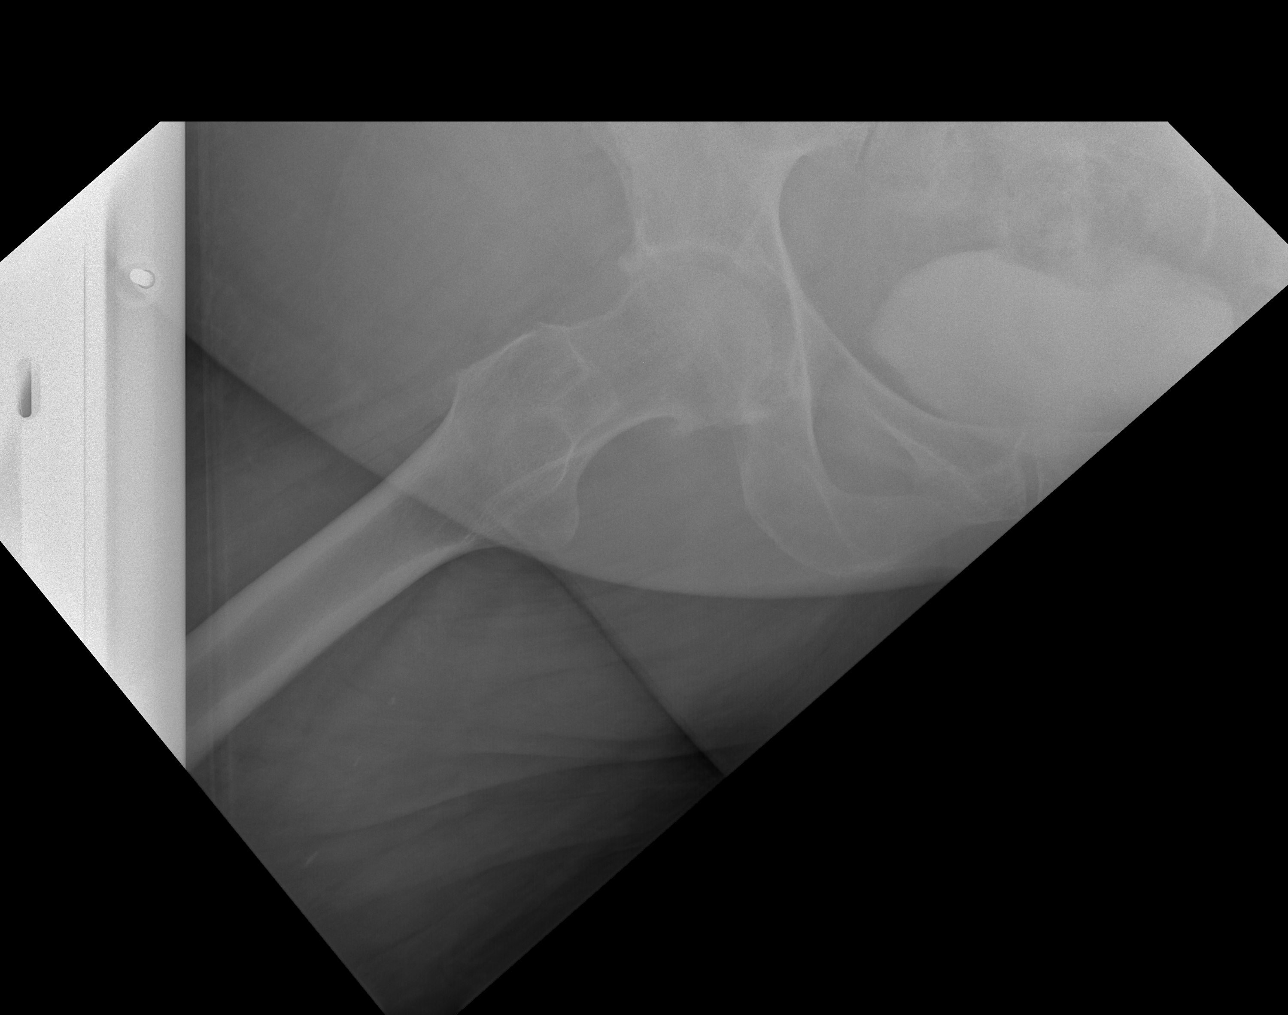

[3 of 3 positions shown; findings below may reference images not displayed]

FINDINGS: There are degenerative changes in the right hip with no acute
fractures or dislocations identified.
IMPRESSION: Degenerative changes in the right hip with no fractures or
dislocations.

## 2018-01-08 ENCOUNTER — Other Ambulatory Visit: Payer: Self-pay | Admitting: Rheumatology

## 2018-01-08 ENCOUNTER — Other Ambulatory Visit: Payer: Self-pay | Admitting: *Deleted

## 2018-01-08 DIAGNOSIS — M797 Fibromyalgia: Secondary | ICD-10-CM

## 2018-01-08 DIAGNOSIS — R5382 Chronic fatigue, unspecified: Principal | ICD-10-CM

## 2018-01-08 NOTE — Telephone Encounter (Signed)
Patient Requested NCCSRS Database Verified LR: 12/11/2017 Pharmacy Confirmed Pended Rx and sent to Dr. Reed for approval.  

## 2018-01-09 ENCOUNTER — Other Ambulatory Visit: Payer: Self-pay | Admitting: *Deleted

## 2018-01-09 MED ORDER — VENLAFAXINE HCL ER 75 MG PO CP24: ORAL_CAPSULE | ORAL | 0 refills | Status: DC

## 2018-01-09 MED ORDER — OXYCODONE HCL 5 MG PO TABS: ORAL_TABLET | ORAL | 0 refills | Status: DC

## 2018-01-09 NOTE — Telephone Encounter (Signed)
[facility] 

## 2018-01-10 ENCOUNTER — Ambulatory Visit
Admission: RE | Admit: 2018-01-10 | Discharge: 2018-01-10 | Disposition: A | Discharge status: Home / Self Care | Payer: Medicare Other | Source: Ambulatory Visit | Attending: Neurology | Admitting: Neurology

## 2018-01-10 DIAGNOSIS — M79605 Pain in left leg: Principal | ICD-10-CM

## 2018-01-10 DIAGNOSIS — M5416 Radiculopathy, lumbar region: Secondary | ICD-10-CM | POA: Diagnosis not present

## 2018-01-10 DIAGNOSIS — M79604 Pain in right leg: Secondary | ICD-10-CM | POA: Diagnosis not present

## 2018-01-12 ENCOUNTER — Telehealth: Payer: Self-pay | Admitting: Neurology

## 2018-01-12 DIAGNOSIS — M48062 Spinal stenosis, lumbar region with neurogenic claudication: Secondary | ICD-10-CM

## 2018-01-12 NOTE — Telephone Encounter (Signed)
  I called the patient.  MRI shows significant spinal stenosis at the L4-5 level and to some degree at L3-4 level.  The patient may wish to try an epidural steroid injection, she will call if she wishes to consider this.  Otherwise, only surgery will correct the significant spinal stenosis.  MRI lumbar 01/12/18:  IMPRESSION:   Abnormal MRI lumbar spine (without) demonstrating: 1. At L4-5: disc bulging, disc protrusion, facet and ligamentum flavum hypertrophy, resulting in severe spinal stenosis and severe biforaminal stenosis. Also degenerative endplate and marrow edema at L4-5 with STIR hyperintense signal. 2. At L3-4: disc bulging, facet hypertrophy with moderate-severe spinal stenosis and severe biforaminal stenosis. 3. At L5-S1: loss of disc space height, facet hypertrophy, left hemi-laminectomy, with no spinal stenosis or foraminal narrowing. 4. Compared to MRI on 10/19/12, there has been significant worsening of degenerative spine disease, especially at L4-5 level.

## 2018-01-17 NOTE — Telephone Encounter (Signed)
I called the patient.  The patient has significant spinal stenosis at the L4-5 level.  She wants to try an epidural steroid injection, if this does not offer significant benefit, we may go on to get a referral to a surgeon.

## 2018-01-17 NOTE — Telephone Encounter (Signed)
Patient calling to discuss MRI because she did not get complete message on answering machine.. She can be reached at [phone].

## 2018-01-17 NOTE — Addendum Note (Signed)
Addended by: Kathrynn Ducking on: 01/17/2018 04:47 PM   Modules accepted: Orders

## 2018-01-19 ENCOUNTER — Other Ambulatory Visit: Payer: Self-pay | Admitting: Neurology

## 2018-01-19 DIAGNOSIS — M48062 Spinal stenosis, lumbar region with neurogenic claudication: Secondary | ICD-10-CM

## 2018-01-22 ENCOUNTER — Ambulatory Visit (INDEPENDENT_AMBULATORY_CARE_PROVIDER_SITE_OTHER): Payer: Medicare Other | Admitting: Podiatry

## 2018-01-22 ENCOUNTER — Other Ambulatory Visit: Payer: Self-pay

## 2018-01-22 ENCOUNTER — Encounter: Payer: Self-pay | Admitting: Podiatry

## 2018-01-22 DIAGNOSIS — L02612 Cutaneous abscess of left foot: Secondary | ICD-10-CM | POA: Diagnosis not present

## 2018-01-22 DIAGNOSIS — L03032 Cellulitis of left toe: Secondary | ICD-10-CM

## 2018-01-22 DIAGNOSIS — Z79899 Other long term (current) drug therapy: Secondary | ICD-10-CM | POA: Diagnosis not present

## 2018-01-22 DIAGNOSIS — L089 Local infection of the skin and subcutaneous tissue, unspecified: Secondary | ICD-10-CM

## 2018-01-22 DIAGNOSIS — S90822A Blister (nonthermal), left foot, initial encounter: Secondary | ICD-10-CM

## 2018-01-23 LAB — COMPLETE METABOLIC PANEL WITH GFR
AG Ratio: 1.3 (calc) (ref 1.0–2.5)
ALT: 14 U/L (ref 6–29)
AST: 26 U/L (ref 10–35)
Albumin: 4 g/dL (ref 3.6–5.1)
Alkaline phosphatase (APISO): 121 U/L (ref 33–130)
BUN/Creatinine Ratio: 14 (calc) (ref 6–22)
BUN: 14 mg/dL (ref 7–25)
CALCIUM: 9.7 mg/dL (ref 8.6–10.4)
CO2: 30 mmol/L (ref 20–32)
CREATININE: 0.97 mg/dL — AB (ref 0.60–0.93)
Chloride: 101 mmol/L (ref 98–110)
GFR, EST AFRICAN AMERICAN: 65 mL/min/{1.73_m2} (ref >=60)
GFR, EST NON AFRICAN AMERICAN: 56 mL/min/{1.73_m2} — AB (ref >=60)
GLUCOSE: 198 mg/dL — AB (ref 65–99)
Globulin: 3 g/dL (calc) (ref 1.9–3.7)
Potassium: 4.6 mmol/L (ref 3.5–5.3)
Sodium: 139 mmol/L (ref 135–146)
TOTAL PROTEIN: 7 g/dL (ref 6.1–8.1)
Total Bilirubin: 0.5 mg/dL (ref 0.2–1.2)

## 2018-01-23 LAB — CBC WITH DIFFERENTIAL/PLATELET
BASOS ABS: 146 {cells}/uL (ref 0–200)
Basophils Relative: 1.1 %
EOS ABS: 479 {cells}/uL (ref 15–500)
EOS PCT: 3.6 %
HEMATOCRIT: 43.9 % (ref 35.0–45.0)
HEMOGLOBIN: 14.5 g/dL (ref 11.7–15.5)
LYMPHS ABS: 2035 {cells}/uL (ref 850–3900)
MCH: 27.8 pg (ref 27.0–33.0)
MCHC: 33 g/dL (ref 32.0–36.0)
MCV: 84.1 fL (ref 80.0–100.0)
MPV: 10.8 fL (ref 7.5–12.5)
Monocytes Relative: 9.9 %
Neutro Abs: 9323 cells/uL — ABNORMAL HIGH (ref 1500–7800)
Neutrophils Relative %: 70.1 %
Platelets: 305 10*3/uL (ref 140–400)
RBC: 5.22 10*6/uL — ABNORMAL HIGH (ref 3.80–5.10)
RDW: 13.3 % (ref 11.0–15.0)
Total Lymphocyte: 15.3 %
WBC: 13.3 10*3/uL — ABNORMAL HIGH (ref 3.8–10.8)
WBCMIX: 1317 {cells}/uL — AB (ref 200–950)

## 2018-01-23 NOTE — Progress Notes (Signed)
Labs are stable.  Glucose is elevated.  Please notify patient.

## 2018-01-24 NOTE — Progress Notes (Signed)
Subjective:   Patient ID: Katherine Delgado, female   DOB: 77 y.o.   MRN: [id number]   HPI Patient presents with husband concerned about color of the left big toe and states they have been soaking it and keeping it clean   ROS      Objective:  Physical Exam  Neurovascular status intact with patient's left hallux showing a keratotic-like tissue with discoloration but no active drainage noted or proximal edema erythema noted     Assessment:  Appears to be healing well with patient wearing a wedge shoe to keep pressure off of this with keratotic tissue formation but no current drainage or breakdown     Plan:  H&P condition reviewed and at this point I went ahead and I did sterile sharp debridement of tissue flush the area and instructed on continued wide shoe usage.  If any redness drainage or swelling were to occur she is to reappoint immediately

## 2018-01-26 ENCOUNTER — Telehealth: Payer: Self-pay | Admitting: Rheumatology

## 2018-01-26 NOTE — Telephone Encounter (Signed)
Please a schedule appointment so we can see her toe prior to starting on Xeljanz.

## 2018-01-26 NOTE — Telephone Encounter (Signed)
Patient called stating she was returning your call.   °

## 2018-01-26 NOTE — Telephone Encounter (Signed)
Patient advised of lab results. Patient states she has seen her doctor and he has advised her that the infection in her toe has cleared. Patient would like to know if she can restart her Morrie Sheldon? Please advise.

## 2018-01-29 ENCOUNTER — Ambulatory Visit
Admission: RE | Admit: 2018-01-29 | Discharge: 2018-01-29 | Disposition: A | Discharge status: Home / Self Care | Payer: Medicare Other | Source: Ambulatory Visit | Attending: Neurology | Admitting: Neurology

## 2018-01-29 ENCOUNTER — Other Ambulatory Visit: Payer: Self-pay | Admitting: *Deleted

## 2018-01-29 DIAGNOSIS — M48062 Spinal stenosis, lumbar region with neurogenic claudication: Secondary | ICD-10-CM

## 2018-01-29 DIAGNOSIS — M545 Low back pain: Principal | ICD-10-CM

## 2018-01-29 DIAGNOSIS — G8929 Other chronic pain: Secondary | ICD-10-CM

## 2018-01-29 MED ORDER — HYDROCODONE-ACETAMINOPHEN 10-325 MG PO TABS: 1.0000 | ORAL_TABLET | ORAL | 0 refills | Status: DC | PRN

## 2018-01-29 MED ORDER — METHYLPREDNISOLONE ACETATE 40 MG/ML INJ SUSP (RADIOLOG
120.0000 mg | Freq: Once | INTRAMUSCULAR | Status: AC
  Administered 2018-01-29: 120 mg via EPIDURAL

## 2018-01-29 MED ORDER — IOPAMIDOL (ISOVUE-M 200) INJECTION 41%
1.0000 mL | Freq: Once | INTRAMUSCULAR | Status: AC
  Administered 2018-01-29: 1 mL via EPIDURAL

## 2018-01-29 NOTE — Telephone Encounter (Signed)
Patient has been scheduled for 01/30/18 at 3:00 pm.

## 2018-01-29 NOTE — Telephone Encounter (Signed)
Patient requested [place]  LR: 12/31/2017 Pharmacy Confirmed Pended Rx and sent to Dr. Mariea Clonts for approval.

## 2018-01-29 NOTE — Discharge Instructions (Signed)

## 2018-01-29 NOTE — Progress Notes (Signed)
Office Visit Note  Patient: Katherine Delgado             Date of Birth: Mar 22, 1941           MRN: [id number]             PCP: Gayland Curry, DO Referring: Gayland Curry, DO Visit Date: 01/30/2018 Occupation: @GUAROCC @    Subjective:  Left toe follow up.   History of Present Illness: Katherine Delgado is a 77 y.o. female with history of seropositive rheumatoid arthritis.  She had been off Xeljanz and methotrexate due to infection in her left first toe.  She has been seeing Dr. Paulla Dolly who has cleared her to go back on immunosuppressive agents.  She states she has been having lower back pain for which she had cortisone injection by Dr. Ernestina Patches yesterday.  She continues to have some stiffness in her joints and want to go back on her medications.  Her left knee joint has been more bothersome.  He continues to have generalized pain from fibromyalgia  Activities of Daily Living:  Patient reports morning stiffness for 2 hour.   Patient Reports nocturnal pain.  Difficulty dressing/grooming: Denies Difficulty climbing stairs: Reports Difficulty getting out of chair: Reports Difficulty using hands for taps, buttons, cutlery, and/or writing: Denies   Review of Systems  Constitutional: Positive for fatigue. Negative for night sweats, weight gain and weight loss.  HENT: Positive for mouth dryness. Negative for mouth sores, trouble swallowing, trouble swallowing and nose dryness.   Eyes: Positive for dryness. Negative for pain, redness and visual disturbance.  Respiratory: Negative for cough, shortness of breath and difficulty breathing.   Cardiovascular: Negative for chest pain, palpitations, hypertension, irregular heartbeat and swelling in legs/feet.  Gastrointestinal: Negative for blood in stool, constipation and diarrhea.  Endocrine: Negative for increased urination.  Genitourinary: Negative for vaginal dryness.  Musculoskeletal: Positive for arthralgias, joint pain, joint swelling, myalgias,  morning stiffness and myalgias. Negative for muscle weakness and muscle tenderness.  Skin: Negative for color change, rash, hair loss, skin tightness, ulcers and sensitivity to sunlight.  Allergic/Immunologic: Negative for susceptible to infections.  Neurological: Negative for dizziness, memory loss, night sweats and weakness.  Hematological: Negative for swollen glands.  Psychiatric/Behavioral: Positive for depressed mood and sleep disturbance. The patient is not nervous/anxious.     PMFS History:  Patient Active Problem List   Diagnosis Date Noted  . Ulcer of toe of left foot, with necrosis of bone ([place]) 07/25/2016  . Rheumatoid arthritis with positive rheumatoid factor ([place]) 06/14/2016  . High risk medication use 06/14/2016  . Sjogren's syndrome ([place]) 06/14/2016  . Primary osteoarthritis of both knees 06/14/2016  . DDD (degenerative disc disease), lumbar 06/14/2016  . Hypothyroidism 06/14/2016  . Osteoporosis 06/14/2016  . Abnormality of gait 04/19/2016  . Type 2 diabetes mellitus with diabetic polyneuropathy, with long-term current use of insulin ([place]) 11/27/2015  . Severe obesity (BMI >= 40) ([place]) 04/21/2014  . Type II or unspecified type diabetes mellitus with peripheral circulatory disorders, uncontrolled(250.72) 09/12/2013  . Headache(784.0) 07/31/2013  . Sinus infection 07/31/2013  . Chronic low back pain 03/14/2013  . Insomnia 12/06/2012  . Nausea alone 12/06/2012  . Diarrhea 12/06/2012  . Urinary incontinence, urge 12/06/2012  . Lumbosacral root lesions, not elsewhere classified 11/27/2012  . Diabetic polyneuropathy associated with type 2 diabetes mellitus ([place]) 11/27/2012  . EDEMA 05/04/2010  . YEAST INFECTION 05/03/2010  . Hyperlipidemia 05/03/2010  . DEPRESSION 05/03/2010  . History of peripheral  neuropathy 05/03/2010  . GUILLAIN-BARRE SYNDROME 05/03/2010  . Essential hypertension 05/03/2010  . ALLERGIC RHINITIS 05/03/2010  . PNEUMONIA 05/03/2010  . COPD  (chronic obstructive pulmonary disease) ([place]) 05/03/2010  . GERD 05/03/2010  . Fibromyalgia 05/03/2010  . DYSPNEA 05/03/2010  . CHEST PAIN 05/03/2010    Past Medical History:  Diagnosis Date  . Abnormal weight gain   . Abnormality of gait 04/19/2016  . Acute infective polyneuritis ([place]) 2002  . Acute maxillary sinusitis   . Acute sinusitis, unspecified   . Allergic rhinitis due to pollen   . Arthritis   . Back injury   . Candidiasis of skin and nails   . Candidiasis of vulva and vagina   . Chronic pain syndrome   . COPD (chronic obstructive pulmonary disease) ([place])   . Cough   . Degenerative arthritis   . Depressive disorder, not elsewhere classified   . Diabetes mellitus without complication ([place])   . Diaphragmatic hernia without mention of obstruction or gangrene   . Dyslipidemia   . Edema   . Fibromyalgia   . GERD (gastroesophageal reflux disease)   . Guillain-Barre syndrome ([place])   . History of benign thymus tumor   . Hypertension   . Insomnia, unspecified   . Lumbago   . Miscarriage 1962  . Mixed hyperlipidemia   . Morbid obesity ([place])   . Obstructive chronic bronchitis with acute bronchitis ([place])   . Osteopenia, senile   . Osteoporosis   . Other malaise and fatigue   . Other specified disease of white blood cells   . Pain in joint, multiple sites   . Polyneuropathy in diabetes(357.2)   . RA (rheumatoid arthritis) ([place])   . Reflux esophagitis   . Rheumatoid arthritis(714.0)   . Shortness of breath   . Spinal stenosis, lumbar region, without neurogenic claudication   . Spondylosis, lumbosacral   . Spontaneous ecchymoses   . Stiffness of joints, not elsewhere classified, multiple sites   . Tear film insufficiency, unspecified   . Thyroid disease   . Thyroid disorder   . Type II or unspecified type diabetes mellitus with peripheral circulatory disorders, uncontrolled(250.72)   . Unspecified chronic bronchitis ([place])   . Unspecified essential hypertension   .  Unspecified hypothyroidism   . Unspecified pruritic disorder   . Unspecified urinary incontinence   . Urinary tract infection, site not specified     Family History  Problem Relation Age of Onset  . Alzheimer's disease Mother   . Heart disease Mother   . Heart disease Father   . Liver disease Father   . Cancer Brother   . Arthritis Son   . Colon polyps Brother   . Colon cancer Neg Hx   . Esophageal cancer Neg Hx   . Kidney disease Neg Hx   . Stomach cancer Neg Hx   . Rectal cancer Neg Hx    Past Surgical History:  Procedure Laterality Date  . ABDOMINAL HYSTERECTOMY  1974  . abdominal tumor  2002  . APPENDECTOMY    . [place]  . BRONCHOSCOPY  2001  . CHOLECYSTECTOMY  1984  . KNEE SURGERY Bilateral 08/27/2010 (L) and 01/11/2011 (R)  . [place]  . OVARIAN CYST SURGERY  1968  . thymus tumor    . 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 TONSILLECTOMY     Social History   Social History Narrative   Walks with cane   Right handed    Caffeine use: Coffee (  2 cups every morning)   Tea: sometimes   Soda: none     Objective: Vital Signs: BP (!) 165/75 (BP Location: Left Arm, Patient Position: Sitting, Cuff Size: Normal)   Pulse 95   Resp 17   Ht 5\' 6"  (1.676 m)   Wt 264 lb (119.7 kg)   BMI 42.61 kg/m    Physical Exam  Constitutional: She is oriented to person, place, and time. She appears well-developed and well-nourished.  HENT:  Head: Normocephalic and atraumatic.  Eyes: Conjunctivae and EOM are normal.  Neck: Normal range of motion.  Cardiovascular: Normal rate, regular rhythm, normal heart sounds and intact distal pulses.  Pulmonary/Chest: Effort normal and breath sounds normal.  Abdominal: Soft. Bowel sounds are normal.  Lymphadenopathy:    She has no cervical adenopathy.  Neurological: She is alert and oriented to person, place, and time.  Skin: Skin is warm and dry. Capillary refill takes less than 2 seconds.  Ulceration under her left great toe was  noted.  Psychiatric: She has a normal mood and affect. Her behavior is normal.  Nursing note and vitals reviewed.    Musculoskeletal Exam: C-spine thoracic lumbar spine good range of motion.  Shoulder joints elbow joints are good range of motion.  She is postsurgical change in her left wrist with limited range of motion.  She has PIP and DIP thickening and synovial thickening over MCP joint without any synovitis.  Hip joints and knee joints were in good range of motion.  She has discomfort in her bilateral knee joints due to underlying osteoarthritis.  I did not see any synovitis on examination.  CDAI Exam: CDAI Homunculus Exam:   Tenderness:  RUE: wrist Right hand: 2nd MCP RLE: tibiofemoral LLE: tibiofemoral  Joint Counts:  CDAI Tender Joint count: 4 CDAI Swollen Joint count: 0  Global Assessments:  Patient Global Assessment: 7 Provider Global Assessment: 5  CDAI Calculated Score: 16    Investigation: No additional findings. CBC Latest Ref Rng & Units 01/22/2018 12/27/2017 09/08/2017  WBC 3.8 - 10.8 Thousand/uL 13.3(H) 17.3(H) 14.9(H)  Hemoglobin 11.7 - 15.5 g/dL 14.5 14.5 13.9  Hematocrit 35.0 - 45.0 % 43.9 43.6 42.8  Platelets 140 - 400 Thousand/uL 305 316 292   CMP Latest Ref Rng & Units 01/22/2018 12/27/2017 09/08/2017  Glucose 65 - 99 mg/dL 198(H) 98 153(H)  BUN 7 - 25 mg/dL 14 17 19   Creatinine 0.60 - 0.93 mg/dL 0.97(H) 0.90 0.95(H)  Sodium 135 - 146 mmol/L 139 141 140  Potassium 3.5 - 5.3 mmol/L 4.6 4.5 5.0  Chloride 98 - 110 mmol/L 101 101 101  CO2 20 - 32 mmol/L 30 31 30   Calcium 8.6 - 10.4 mg/dL 9.7 9.6 9.8  Total Protein 6.1 - 8.1 g/dL 7.0 6.9 6.7  Total Bilirubin 0.2 - 1.2 mg/dL 0.5 0.3 0.4  Alkaline Phos 33 - 130 U/L - - -  AST 10 - 35 U/L 26 42(H) 33  ALT 6 - 29 U/L 14 23 20     Imaging: Mr Lumbar Spine Wo Contrast  Result Date: 01/12/2018  [facility] NEUROIM[facility] REPORT STUDY DATE: 01/10/18 PATIENT NAME: QUINETTA SHILLING DOB: July 02, 1941 MRN:  [id number] ORDERING CLINICIAN: Kathrynn Ducking, MD CLINICAL HISTORY: 77 year old female with bilateral leg pain. EXAM: MRI lumbar spine (without) TECHNIQUE: MRI of the lumbar spine was obtained utilizing 4 mm sagittal slices from W09-81 down to the lower sacrum with T1, T2 and inversion recovery views. In addition 4 mm axial slices from  L1-2 down to L5-S1 level were included with T1 and T2 weighted views. CONTRAST: no COMPARISON: 10/19/12 MRI IMAGING SITE: [facility] [address] (1.5 Tesla MRI)  FINDINGS: On sagittal views the vertebral bodies have normal height and alignment, except --> Degenerative spondylosis and disc protrusions at L3-4 and L4-5.  Degenerative endplate and marrow edema at L4-5 with STIR hyperintense signal.  Loss of intervertebral disc space height at L5-S1. Moderate sized hemangioma within T12 vertebral body.  The conus medullaris terminates at the level of T12.  On axial views: T12-L1: no spinal stenosis or foraminal narrowing L1-2: no spinal stenosis or foraminal narrowing L2-3: facet hypertrophy with no spinal stenosis or foraminal narrowing L3-4: disc bulging, facet hypertrophy with moderate-severe spinal stenosis and severe biforaminal stenosis L4-5: disc bulging, disc protrusion, facet and ligamentum flavum hypertrophy, resulting in severe spinal stenosis and severe biforaminal stenosis L5-S1: facet hypertrophy, left hemi-laminectomy, with no spinal stenosis or foraminal narrowing Limited views of the aorta, kidneys, iliopsoas muscles and sacroiliac joints are notable for increased subcutaneous fatty tissue in the posterior lumbar region as well as paraspinal muscle atrophy in the lower lumbar region.   Abnormal MRI lumbar spine (without) demonstrating: 1. At L4-5: disc bulging, disc protrusion, facet and ligamentum flavum hypertrophy, resulting in severe spinal stenosis and severe biforaminal stenosis. Also degenerative endplate and marrow edema at L4-5 with STIR  hyperintense signal. 2. At L3-4: disc bulging, facet hypertrophy with moderate-severe spinal stenosis and severe biforaminal stenosis. 3. At L5-S1: loss of disc space height, facet hypertrophy, left hemi-laminectomy, with no spinal stenosis or foraminal narrowing. 4. Compared to MRI on 10/19/12, there has been significant worsening of degenerative spine disease, especially at L4-5 level. INTERPRETING PHYSICIAN: Penni Bombard, MD Certified in Neurology, Neurophysiology and Neuroimaging [facility] [address], [place], [place] [address] [phone]   Dg Foot Complete Left  Result Date: 01/01/2018 Please see detailed radiograph report in office note.  Dg Inject Diag/thera/inc Needle/cath/plc Epi/lumb/sac W/img  Result Date: 01/29/2018 CLINICAL DATA:  Low back pain.  BILATERAL leg pain. FLUOROSCOPY TIME:  28 seconds corresponding to a Dose Area Product of 71.88. Gy*m2 EXAM: L4-5 epidural was requested however the stenosis was deemed too severe to allow direct access. The patient has had caudal injections previously with good response. CAUDAL EPIDURAL INJECTION Utilizing a caudal approach, the skin overlying the sacral hiatus was cleansed and anesthetized. A 20 gauge epidural needle was advanced into the sacral epidural space. Injection of Isovue-M 200 shows a good epidural pattern cephalad spread. No vascular opacification is seen. 120 mg of Depo-Medrol mixed with 3 ml of normal saline and 3 ml of 1% Lidocaine were instilled. The procedure was well-tolerated, and the patient was discharged thirty minutes following the injection in good condition. IMPRESSION: Technically successful caudal epidural injection #1. Electronically Signed   By: Staci Righter M.D.   On: 01/29/2018 13:27    Speciality Comments: No specialty comments available.    Procedures:  No procedures performed Allergies: Penicillins; Statins; and Sulfamethoxazole-trimethoprim   Assessment / Plan:       Visit Diagnoses: Rheumatoid arthritis involving multiple sites with positive rheumatoid factor (HCC) - Positive RF, positive anti-CCP.  Patient was treated with methotrexate and Morrie Sheldon which is on hold due to the foot ulceration.  She has synovial thickening but no active synovitis on examination today.  High risk medication use - (Xeljanz 5 mg by mouth twice a day, MTX 0.6 ml sq qwk-on hold), folic acid 1mg   po qd, her Morrie Sheldon and methotrexate are on hold due to recent ulceration on her left toe.  Ulcer of left foot, unspecified ulcer stage (HCC)-she still has breakdown of the skin on her left toe ulcer.  I would not to start on any immunosuppressive agents until the nonsteroidal is completely healed.  I have advised her to follow-up with Dr. Paulla Dolly for right now.  She will come back to me after the ulcer is completely healed.  Primary osteoarthritis of both knees-she continues to have a lot of discomfort in her knee joints due to underlying osteoarthritis.  Sjogren's syndrome with keratoconjunctivitis sicca (HCC)-over-the-counter products were discussed.  Age-related osteoporosis without current pathological fracture - followed up by her PCP.  DDD (degenerative disc disease), lumbar-she has chronic pain and discomfort.  She had recent cortisone injection by Dr. Ernestina Patches.  Fibromyalgia-she is having a flare with generalized pain from fibromyalgia.  History of hypertension  Other insomnia  History of depression  History of peripheral neuropathy  History of obesity  History of COPD  History of diabetes mellitus  History of gastroesophageal reflux (GERD)  History of Guillain-Barre syndrome    Orders: No orders of the defined types were placed in this encounter.  No orders of the defined types were placed in this encounter.   Face-to-face time spent with patient was 30 minutes. >50% of time was spent in counseling and coordination of care.  Follow-Up Instructions: Return in  about 4 months (around 06/01/2018) for Rheumatoid arthritis, Osteoarthritis, Sjogren's.   Bo Merino, MD  Note - This record has been created using Editor, commissioning.  Chart creation errors have been sought, but may not always  have been located. Such creation errors do not reflect on  the standard of medical care.

## 2018-01-29 NOTE — Telephone Encounter (Signed)
Attempted to contact the patient and left message for patient to call the office.  

## 2018-01-30 ENCOUNTER — Encounter: Payer: Self-pay | Admitting: Rheumatology

## 2018-01-30 ENCOUNTER — Ambulatory Visit (INDEPENDENT_AMBULATORY_CARE_PROVIDER_SITE_OTHER): Payer: Medicare Other | Admitting: Rheumatology

## 2018-01-30 VITALS — BP 165/75 | HR 95 | Resp 17 | Ht 66.0 in | Wt 264.0 lb

## 2018-01-30 DIAGNOSIS — Z8659 Personal history of other mental and behavioral disorders: Secondary | ICD-10-CM | POA: Diagnosis not present

## 2018-01-30 DIAGNOSIS — Z8639 Personal history of other endocrine, nutritional and metabolic disease: Secondary | ICD-10-CM

## 2018-01-30 DIAGNOSIS — Z8719 Personal history of other diseases of the digestive system: Secondary | ICD-10-CM

## 2018-01-30 DIAGNOSIS — G4709 Other insomnia: Secondary | ICD-10-CM | POA: Diagnosis not present

## 2018-01-30 DIAGNOSIS — M3501 Sicca syndrome with keratoconjunctivitis: Secondary | ICD-10-CM

## 2018-01-30 DIAGNOSIS — M810 Age-related osteoporosis without current pathological fracture: Secondary | ICD-10-CM | POA: Diagnosis not present

## 2018-01-30 DIAGNOSIS — M5136 Other intervertebral disc degeneration, lumbar region: Secondary | ICD-10-CM | POA: Diagnosis not present

## 2018-01-30 DIAGNOSIS — Z8709 Personal history of other diseases of the respiratory system: Secondary | ICD-10-CM

## 2018-01-30 DIAGNOSIS — M797 Fibromyalgia: Secondary | ICD-10-CM

## 2018-01-30 DIAGNOSIS — M170 Bilateral primary osteoarthritis of knee: Secondary | ICD-10-CM

## 2018-01-30 DIAGNOSIS — Z8669 Personal history of other diseases of the nervous system and sense organs: Secondary | ICD-10-CM | POA: Diagnosis not present

## 2018-01-30 DIAGNOSIS — Z8679 Personal history of other diseases of the circulatory system: Secondary | ICD-10-CM

## 2018-01-30 DIAGNOSIS — L97529 Non-pressure chronic ulcer of other part of left foot with unspecified severity: Secondary | ICD-10-CM | POA: Diagnosis not present

## 2018-01-30 DIAGNOSIS — Z79899 Other long term (current) drug therapy: Secondary | ICD-10-CM

## 2018-01-30 DIAGNOSIS — M0579 Rheumatoid arthritis with rheumatoid factor of multiple sites without organ or systems involvement: Secondary | ICD-10-CM | POA: Diagnosis not present

## 2018-02-05 ENCOUNTER — Other Ambulatory Visit: Payer: Self-pay | Admitting: Internal Medicine

## 2018-02-07 ENCOUNTER — Other Ambulatory Visit: Payer: Self-pay | Admitting: *Deleted

## 2018-02-07 DIAGNOSIS — R5382 Chronic fatigue, unspecified: Principal | ICD-10-CM

## 2018-02-07 DIAGNOSIS — M797 Fibromyalgia: Secondary | ICD-10-CM

## 2018-02-07 MED ORDER — OXYCODONE HCL 5 MG PO TABS: ORAL_TABLET | ORAL | 0 refills | Status: DC

## 2018-02-07 NOTE — Telephone Encounter (Signed)
Patient requested [place] Verified LR: 01/09/2018 Pharmacy Confirmed Pended Rx and sent to Dr. Mariea Clonts for approval

## 2018-02-08 ENCOUNTER — Ambulatory Visit: Payer: Medicare Other | Admitting: Internal Medicine

## 2018-02-19 ENCOUNTER — Other Ambulatory Visit: Payer: Self-pay | Admitting: *Deleted

## 2018-02-19 MED ORDER — LISINOPRIL 20 MG PO TABS: 20.0000 mg | ORAL_TABLET | Freq: Every day | ORAL | 0 refills | Status: DC

## 2018-02-19 NOTE — Telephone Encounter (Signed)
Rx(s) sent to pharmacy electronically.  

## 2018-02-20 ENCOUNTER — Other Ambulatory Visit: Payer: Self-pay | Admitting: Internal Medicine

## 2018-02-21 ENCOUNTER — Other Ambulatory Visit: Payer: Self-pay | Admitting: Cardiology

## 2018-02-21 DIAGNOSIS — R0602 Shortness of breath: Secondary | ICD-10-CM

## 2018-02-26 ENCOUNTER — Other Ambulatory Visit: Payer: Self-pay

## 2018-02-26 DIAGNOSIS — G8929 Other chronic pain: Secondary | ICD-10-CM

## 2018-02-26 DIAGNOSIS — M545 Low back pain: Principal | ICD-10-CM

## 2018-02-26 MED ORDER — HYDROCODONE-ACETAMINOPHEN 10-325 MG PO TABS: 1.0000 | ORAL_TABLET | ORAL | 0 refills | Status: DC | PRN

## 2018-02-26 NOTE — Telephone Encounter (Signed)
 Database verified and compliance confirmed   Hydrocodone last filled 01/29/2018, pharmacy on file confirmed

## 2018-02-26 NOTE — Telephone Encounter (Signed)
Patient aware rx sent  

## 2018-03-04 ENCOUNTER — Other Ambulatory Visit: Payer: Self-pay | Admitting: Internal Medicine

## 2018-03-05 ENCOUNTER — Encounter: Payer: Self-pay | Admitting: Podiatry

## 2018-03-05 ENCOUNTER — Telehealth: Payer: Self-pay | Admitting: *Deleted

## 2018-03-05 ENCOUNTER — Ambulatory Visit (INDEPENDENT_AMBULATORY_CARE_PROVIDER_SITE_OTHER): Payer: Medicare Other | Admitting: Podiatry

## 2018-03-05 DIAGNOSIS — L02612 Cutaneous abscess of left foot: Secondary | ICD-10-CM

## 2018-03-05 DIAGNOSIS — L03032 Cellulitis of left toe: Secondary | ICD-10-CM

## 2018-03-05 MED ORDER — CADEXOMER IODINE 0.9 % EX GEL: CUTANEOUS | Status: AC

## 2018-03-05 NOTE — Telephone Encounter (Signed)
Patient called and stated that she is having pain in both sides of her neck around her glands and the base of her neck. Stated that it hurts with movement. No Sore Throat. Temperature is running 100-101. Patient wants an appointment to come in. Appointment scheduled for Dr. Eulas Post tomorrow morning. Patient agreed.

## 2018-03-06 ENCOUNTER — Other Ambulatory Visit: Payer: Self-pay | Admitting: *Deleted

## 2018-03-06 ENCOUNTER — Encounter: Payer: Self-pay | Admitting: Internal Medicine

## 2018-03-06 ENCOUNTER — Ambulatory Visit (INDEPENDENT_AMBULATORY_CARE_PROVIDER_SITE_OTHER): Payer: Medicare Other | Admitting: Internal Medicine

## 2018-03-06 VITALS — BP 168/70 | HR 86 | Temp 98.1°F | Resp 20 | Ht 66.0 in | Wt 262.0 lb

## 2018-03-06 DIAGNOSIS — E1142 Type 2 diabetes mellitus with diabetic polyneuropathy: Secondary | ICD-10-CM

## 2018-03-06 DIAGNOSIS — Z794 Long term (current) use of insulin: Secondary | ICD-10-CM | POA: Diagnosis not present

## 2018-03-06 DIAGNOSIS — M797 Fibromyalgia: Secondary | ICD-10-CM

## 2018-03-06 DIAGNOSIS — R5382 Chronic fatigue, unspecified: Principal | ICD-10-CM

## 2018-03-06 DIAGNOSIS — M0579 Rheumatoid arthritis with rheumatoid factor of multiple sites without organ or systems involvement: Secondary | ICD-10-CM | POA: Diagnosis not present

## 2018-03-06 DIAGNOSIS — K1121 Acute sialoadenitis: Secondary | ICD-10-CM | POA: Diagnosis not present

## 2018-03-06 LAB — CBC WITH DIFFERENTIAL/PLATELET
Basophils Absolute: 133 {cells}/uL (ref 0–200)
Basophils Relative: 0.8 %
Eosinophils Absolute: 548 {cells}/uL — ABNORMAL HIGH (ref 15–500)
Eosinophils Relative: 3.3 %
HCT: 42.1 % (ref 35.0–45.0)
Hemoglobin: 13.7 g/dL (ref 11.7–15.5)
Lymphs Abs: 1627 {cells}/uL (ref 850–3900)
MCH: 27.7 pg (ref 27.0–33.0)
MCHC: 32.5 g/dL (ref 32.0–36.0)
MCV: 85.1 fL (ref 80.0–100.0)
MPV: 11.4 fL (ref 7.5–12.5)
Monocytes Relative: 8.3 %
Neutro Abs: 12915 {cells}/uL — ABNORMAL HIGH (ref 1500–7800)
Neutrophils Relative %: 77.8 %
Platelets: 307 Thousand/uL (ref 140–400)
RBC: 4.95 Million/uL (ref 3.80–5.10)
RDW: 12.9 % (ref 11.0–15.0)
Total Lymphocyte: 9.8 %
WBC mixed population: 1378 {cells}/uL — ABNORMAL HIGH (ref 200–950)
WBC: 16.6 Thousand/uL — ABNORMAL HIGH (ref 3.8–10.8)

## 2018-03-06 MED ORDER — PROMETHAZINE HCL 12.5 MG PO TABS: ORAL_TABLET | ORAL | 0 refills | Status: DC

## 2018-03-06 MED ORDER — OXYCODONE HCL 5 MG PO TABS: ORAL_TABLET | ORAL | 0 refills | Status: DC

## 2018-03-06 MED ORDER — AMLODIPINE BESYLATE 10 MG PO TABS: 10.0000 mg | ORAL_TABLET | Freq: Every day | ORAL | 0 refills | Status: DC

## 2018-03-06 MED ORDER — SACCHAROMYCES BOULARDII 250 MG PO CAPS: 250.0000 mg | ORAL_CAPSULE | Freq: Two times a day (BID) | ORAL | 0 refills | Status: DC

## 2018-03-06 MED ORDER — CLINDAMYCIN HCL 150 MG PO CAPS: 450.0000 mg | ORAL_CAPSULE | Freq: Three times a day (TID) | ORAL | 0 refills | Status: DC

## 2018-03-06 MED ORDER — CIPROFLOXACIN HCL 500 MG PO TABS: 500.0000 mg | ORAL_TABLET | Freq: Two times a day (BID) | ORAL | 0 refills | Status: DC

## 2018-03-06 NOTE — Telephone Encounter (Signed)
Patient requested Refill NCCSRS Database Verified LR: 02/07/2018 Pharmacy Confirmed Pended Rx and sent to Dr Mariea Clonts for approval.

## 2018-03-06 NOTE — Patient Instructions (Signed)
START CLINDAMYCIN 450MG  3 TIMES DAILY X 10 DAYS  START CIPRO 500MG  2 TIMES DAILY X 10 DAYS  TAKE FLORASTER 2 TIMES DAILY WHILE ON ANTIBIOTICS  Continue other medications as ordered  Follow up with specialists as schedu;ed  Will call with lab results  Push fluids and rest  Follow up with Dr Mariea Clonts as scheduled or sooner if not feeling better   Parotitis Parotitis means that you have irritation and swelling (inflammation) in one or both of your parotid glands. These glands make spit (saliva). They are found on each side of your face, below and in front of your earlobes. You may or may not have pain with this condition. Follow these instructions at home: Medicines  Take over-the-counter and prescription medicines only as told by your doctor.  If you were prescribed an antibiotic medicine, take it as told by your doctor. Do not stop taking the antibiotic even if you start to feel better. Managing pain and swelling  Apply warm cloths (compresses) to the swollen area as told by your doctor.  Gently rub your parotid glands as told by your doctor. General instructions   Drink enough fluid to keep your pee (urine) clear or pale yellow.  Suck on sour candy. This may help: ? To make your mouth less dry. ? To make more spit.  Keep your mouth clean and moist. ? Gargle with a salt-water mixture 3-4 times per day, or as needed. ? To make a salt-water mixture, stir -1 tsp of salt into 1 cup of warm water.  Take good care of your mouth: ? Brush your teeth at least two times per day. ? Floss your teeth every day. ? See your dentist regularly.  Do not use tobacco products. These include cigarettes, chewing tobacco, or e-cigarettes. If you need help quitting,  ask your doctor.  Keep all follow-up visits as told by your doctor. This is important. Contact a doctor if:  You have a fever or chills.  You have new symptoms.  Your symptoms get worse.  Your symptoms do not get better  with treatment. This information is not intended to replace advice given to you by your health care provider. Make sure you discuss any questions you have with your health care provider. Document Released: 09/10/2010 Document Revised: 01/14/2016 Document Reviewed: 01/01/2015 Elsevier Interactive Patient Education  [employer].

## 2018-03-06 NOTE — Progress Notes (Signed)
Patient ID: Katherine Delgado, female   DOB: [birth year], 77 y.o.   MRN: [id number]   [facility] OFFICE  Provider: DR Arletha Grippe  Code Status:  Goals of Care:  Advanced Directives 10/05/2017  Does Patient Have a Medical Advance Directive? Yes  Type of Paramedic of [place];Living will  Does patient want to make changes to medical advance directive? No - Patient declined  Copy of [place] in Chart? No - copy requested  Would patient like information on creating a medical advance directive? -  Pre-existing out of facility DNR order (yellow form or pink MOST form) -     Chief Complaint  Patient presents with  . Acute Visit    Temp,swollen glands,     HPI: Patient is a 77 y.o. female seen today for an acute visit for painful swollen neck glands x 3 days. Pain extends behind ears and occiput. She reports that it is "extremely hard to move my head in any direction". It began as pain in her neck about 1.5 weeks ago and progressively has worsened. She has a hx parotid gland problems and Sjogren's syndrome.  Chronic pain stable on current pain meds. Rheum d/c'd xelganz in May 2019.   Sore throat - no  Postnasal drip - no  Sinus pressure/pain - yes but min  Nasal congestion - no  Rhinorrhea - yes, clear  Ear pain/pressure - yes  Dysphagia - yes  Fever/chills - no  Cough - no new cough but has COPD  CP - no  SOB - no  Tried OTC tylenol, alka seltzer plus cold - not helpful  Past Medical History:  Diagnosis Date  . Abnormal weight gain   . Abnormality of gait 04/19/2016  . Acute infective polyneuritis ([place]) 2002  . Acute maxillary sinusitis   . Acute sinusitis, unspecified   . Allergic rhinitis due to pollen   . Arthritis   . Back injury   . Candidiasis of skin and nails   . Candidiasis of vulva and vagina   . Chronic pain syndrome   . COPD (chronic obstructive pulmonary disease) ([place])   . Cough   . Degenerative arthritis   .  Depressive disorder, not elsewhere classified   . Diabetes mellitus without complication ([place])   . Diaphragmatic hernia without mention of obstruction or gangrene   . Dyslipidemia   . Edema   . Fibromyalgia   . GERD (gastroesophageal reflux disease)   . Guillain-Barre syndrome ([place])   . History of benign thymus tumor   . Hypertension   . Insomnia, unspecified   . Lumbago   . Miscarriage 1962  . Mixed hyperlipidemia   . Morbid obesity ([place])   . Obstructive chronic bronchitis with acute bronchitis ([place])   . Osteopenia, senile   . Osteoporosis   . Other malaise and fatigue   . Other specified disease of white blood cells   . Pain in joint, multiple sites   . Polyneuropathy in diabetes(357.2)   . RA (rheumatoid arthritis) ([place])   . Reflux esophagitis   . Rheumatoid arthritis(714.0)   . Shortness of breath   . Spinal stenosis, lumbar region, without neurogenic claudication   . Spondylosis, lumbosacral   . Spontaneous ecchymoses   . Stiffness of joints, not elsewhere classified, multiple sites   . Tear film insufficiency, unspecified   . Thyroid disease   . Thyroid disorder   . Type II or unspecified type diabetes mellitus with peripheral circulatory disorders,  uncontrolled(250.72)   . Unspecified chronic bronchitis ([place])   . Unspecified essential hypertension   . Unspecified hypothyroidism   . Unspecified pruritic disorder   . Unspecified urinary incontinence   . Urinary tract infection, site not specified     Past Surgical History:  Procedure Laterality Date  . ABDOMINAL HYSTERECTOMY  1974  . abdominal tumor  2002  . APPENDECTOMY    . [place]  . BRONCHOSCOPY  2001  . CHOLECYSTECTOMY  1984  . KNEE SURGERY Bilateral 08/27/2010 (L) and 01/11/2011 (R)  . [place]  . OVARIAN CYST SURGERY  1968  . thymus tumor    . 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 TONSILLECTOMY       reports that she quit smoking about 38 years ago. Her smoking use included cigarettes. She  has never used smokeless tobacco. She reports that she does not drink alcohol or use drugs. Social History   Socioeconomic History  . Marital status: Widowed    Spouse name: Not on file  . Number of children: 2  . Years of education: 45  . Highest education level: Not on file  Occupational History  . Occupation: Retired  Scientific laboratory technician  . Financial resource strain: Somewhat hard  . Food insecurity:    Worry: Sometimes true    Inability: Sometimes true  . Transportation needs:    Medical: No    Non-medical: No  Tobacco Use  . Smoking status: Former Smoker    Types: Cigarettes    Last attempt to quit: 12/07/1979    Years since quitting: 38.2  . Smokeless tobacco: Never Used  Substance and Sexual Activity  . Alcohol use: No    Alcohol/week: 0.0 oz  . Drug use: No  . Sexual activity: Never  Lifestyle  . Physical activity:    Days per week: 0 days    Minutes per session: 0 min  . Stress: Rather much  Relationships  . Social connections:    Talks on phone: More than three times a week    Gets together: More than three times a week    Attends religious service: Never    Active member of club or organization: No    Attends meetings of clubs or organizations: Never    Relationship status: Widowed  . Intimate partner violence:    Fear of current or ex partner: No    Emotionally abused: No    Physically abused: No    Forced sexual activity: No  Other Topics Concern  . Not on file  Social History Narrative   Walks with cane   Right handed    Caffeine use: Coffee (2 cups every morning)   Tea: sometimes   Soda: none    Family History  Problem Relation Age of Onset  . Alzheimer's disease Mother   . Heart disease Mother   . Heart disease Father   . Liver disease Father   . Cancer Brother   . Arthritis Son   . Colon polyps Brother   . Colon cancer Neg Hx   . Esophageal cancer Neg Hx   . Kidney disease Neg Hx   . Stomach cancer Neg Hx   . Rectal cancer Neg Hx      Allergies  Allergen Reactions  . Penicillins Hives, Itching, Swelling and Rash  . Statins Other (See Comments)    Myopathy, transaminitis  . Sulfamethoxazole-Trimethoprim Itching    Outpatient Encounter Medications as of 03/06/2018  Medication Sig  .  albuterol (PROVENTIL HFA;VENTOLIN HFA) 108 (90 Base) MCG/ACT inhaler Inhale 2 puffs into the lungs every 6 (six) hours as needed for wheezing or shortness of breath.  Marland Kitchen alendronate (FOSAMAX) 70 MG tablet Take 1 tablet (70 mg total) by mouth once a week. Take with a full glass of water on an empty stomach.  Marland Kitchen amLODipine (NORVASC) 10 MG tablet Take 1 tablet (10 mg total) by mouth daily. Please schedule appointment for refills.  Marland Kitchen aspirin EC 81 MG tablet Take 81 mg by mouth daily as needed.   . B-D UF III MINI PEN NEEDLES 31G X 5 MM MISC USE AS DIRECTED  . folic acid (FOLVITE) 1 MG tablet Take 2 tablets (2 mg total) by mouth daily.  . furosemide (LASIX) 20 MG tablet TAKE 1 TABLET BY MOUTH TWICE DAILY AS NEEDED FOR 3 POUND WEIGHT GAIN IN ONE DAY OR 5 POUNDS IN ONE WEEK  . gabapentin (NEURONTIN) 600 MG tablet TAKE 1 TABLET BY MOUTH THREE TIMES DAILY  . glucose blood (ACCU-CHEK SMARTVIEW) test strip Check blood sugar 3-4 times daily  . HUMALOG KWIKPEN 200 UNIT/ML SOPN INJECT 20 UNITS INTO THE SKIN THREE TIMES DAILY BEFORE MEALS  . HYDROcodone-acetaminophen (NORCO) 10-325 MG tablet Take 1 tablet by mouth every 3 (three) hours as needed for moderate pain.  Marland Kitchen levothyroxine (SYNTHROID, LEVOTHROID) 137 MCG tablet TAKE 1 TABLET BY MOUTH DAILY BEFORE BREAKFAST ON AN EMPTY STOMACH( LABS OVERDUE)  . lisinopril (PRINIVIL,ZESTRIL) 20 MG tablet Take 1 tablet (20 mg total) by mouth daily. NEEDS APPOINTMENT FOR FUTURE REFILLS  . loperamide (IMODIUM A-D) 2 MG tablet Take 2 mg by mouth 4 (four) times daily as needed for diarrhea or loose stools.  . magnesium oxide (MAG-OX) 400 MG tablet Take 400 mg by mouth daily as needed.  . Melatonin 10 MG CAPS Take 1 capsule  by mouth at bedtime  . metoprolol tartrate (LOPRESSOR) 50 MG tablet Take 50 mg by mouth 2 (two) times daily.   Marland Kitchen MYRBETRIQ 50 MG TB24 tablet TAKE 1 TABLET(50 MG) BY MOUTH DAILY  . nystatin (NYSTATIN) powder Apply topically daily as needed.  Marland Kitchen omega-3 acid ethyl esters (LOVAZA) 1 g capsule TAKE ONE CAPSULE BY MOUTH EVERY DAY  . oxyCODONE (OXY IR/ROXICODONE) 5 MG immediate release tablet Take one tablet every 6 hours as needed for severe pain, Take 2 tablets at bedtime.  . polyvinyl alcohol (LIQUIFILM TEARS) 1.4 % ophthalmic solution Place 1 drop into both eyes as needed (for dry eyes).   . potassium chloride (MICRO-K) 10 MEQ CR capsule Take 2 capsules by mouth 2 (two) times daily. While on lasix  . pregabalin (LYRICA) 150 MG capsule Take 1 capsule (150 mg total) by mouth 2 (two) times daily.  . promethazine (PHENERGAN) 12.5 MG tablet TAKE 1 TABLET BY MOUTH AS NEEDED FOR SEVERE NAUSEA AS DIRECTED  . TOUJEO SOLOSTAR 300 UNIT/ML SOPN INJECT 100 UNITS UNDER THE SKIN EVERY NIGHT AT BEDTIME  . triamterene-hydrochlorothiazide (MAXZIDE-25) 37.5-25 MG tablet TAKE 1 TABLET BY MOUTH DAILY  . umeclidinium-vilanterol (ANORO ELLIPTA) 62.5-25 MCG/INH AEPB Inhale 1 puff into the lungs daily.  Marland Kitchen venlafaxine XR (EFFEXOR-XR) 75 MG 24 hr capsule TAKE 1 CAPSULE(75 MG) BY MOUTH DAILY WITH BREAKFAST  . VICTOZA 18 MG/3ML SOPN ADMINISTER 1.8 MG UNDER THE SKIN DAILY FOR BLOOD SUGAR  . XELJANZ 5 MG TABS TAKE 1 TABLET BY MOUTH TWICE DAILY  . [DISCONTINUED] ALPRAZolam (XANAX) 0.5 MG tablet Take 2 tablets approximately 45 minutes prior to the MRI study, take  a third tablet if needed.  . [DISCONTINUED] cephALEXin (KEFLEX) 500 MG capsule Take 1 capsule (500 mg total) by mouth 3 (three) times daily.  . [DISCONTINUED] metoprolol tartrate (LOPRESSOR) 50 MG tablet Take 1.5 tablets (75 mg total) by mouth 2 (two) times daily. NEED OV.   Facility-Administered Encounter Medications as of 03/06/2018  Medication  . cadexomer iodine  (IODOSORB) 0.9 % gel    Review of Systems:  Review of Systems  HENT: Positive for ear pain, rhinorrhea, sinus pressure, sinus pain and trouble swallowing.   Musculoskeletal: Positive for arthralgias.  All other systems reviewed and are negative.   Health Maintenance  Topic Date Due  . OPHTHALMOLOGY EXAM  12/20/1950  . FOOT EXAM  09/27/2016  . HEMOGLOBIN A1C  08/23/2017  . INFLUENZA VACCINE  08/22/2019 (Originally 03/22/2018)  . DEXA SCAN  09/20/2025  . TETANUS/TDAP  05/28/2026  . PNA vac Low Risk Adult  Completed    Physical Exam: Vitals:   03/06/18 0851  BP: (!) 168/70  Pulse: 86  Resp: 20  Temp: 98.1 F (36.7 C)  SpO2: 92%  Weight: 262 lb (118.8 kg)  Height: 5\' 6"  (1.676 m)   Body mass index is 42.29 kg/m. Physical Exam  Constitutional: She is oriented to person, place, and time.  Looks ill in NAD  HENT:  Head:    Right angle of the mandible swollen and TTP; right parotid duct erythema but no d/c; MM dry; no oral thrush; right maxillary sinus TTP; b/l TM intact with no redness.  Eyes: Pupils are equal, round, and reactive to light. Right eye exhibits no discharge. Left eye exhibits no discharge.  Neck: Neck supple. No thyromegaly present.  Cardiovascular: Normal rate, regular rhythm and intact distal pulses. Exam reveals no gallop and no friction rub.  Murmur (1/6 SEM) heard. Trace LE edema b/l; no calf TTP  Pulmonary/Chest: Effort normal and breath sounds normal. No stridor. No respiratory distress. She has no wheezes. She has no rales. She exhibits tenderness.  Musculoskeletal: She exhibits edema (small and large joints).  Lymphadenopathy:       Head (right side): Submandibular and posterior auricular adenopathy present. No submental, no preauricular and no occipital adenopathy present.    She has cervical adenopathy.       Right cervical: Superficial cervical and posterior cervical adenopathy present.       Right: No supraclavicular adenopathy present.    Neurological: She is alert and oriented to person, place, and time.  Skin: Skin is warm and dry. No rash noted.  Psychiatric: She has a normal mood and affect. Her behavior is normal. Judgment and thought content normal.    Labs reviewed: Basic Metabolic Panel: Recent Labs    09/08/17 1015 12/27/17 1352 01/22/18 0941  NA 140 141 139  K 5.0 4.5 4.6  CL 101 101 101  CO2 30 31 30   GLUCOSE 153* 98 198*  BUN 19 17 14   CREATININE 0.95* 0.90 0.97*  CALCIUM 9.8 9.6 9.7   Liver Function Tests: Recent Labs    09/08/17 1015 12/27/17 1352 01/22/18 0941  AST 33 42* 26  ALT 20 23 14   BILITOT 0.4 0.3 0.5  PROT 6.7 6.9 7.0   No results for input(s): LIPASE, AMYLASE in the last 8760 hours. No results for input(s): AMMONIA in the last 8760 hours. CBC: Recent Labs    09/08/17 1015 12/27/17 1352 01/22/18 0941  WBC 14.9* 17.3* 13.3*  NEUTROABS 10,862* 13,131* 9,323*  HGB 13.9 14.5 14.5  HCT  42.8 43.6 43.9  MCV 82.9 83.4 84.1  PLT 292 316 305   Lipid Panel: No results for input(s): CHOL, HDL, LDLCALC, TRIG, CHOLHDL, LDLDIRECT in the last 8760 hours. Lab Results  Component Value Date   HGBA1C 8.2 (H) 02/20/2017    Procedures since last visit: No results found.  Assessment/Plan   ICD-10-CM   1. Parotitis, acute K11.21 CBC with Differential/Platelets  2. Rheumatoid arthritis involving multiple sites with positive rheumatoid factor (HCC) M05.79   3. Type 2 diabetes mellitus with diabetic polyneuropathy, with long-term current use of insulin (HCC) E11.42    Z79.4   4. Fibromyalgia M79.7    START CLINDAMYCIN 450MG  3 TIMES DAILY X 10 DAYS  START CIPRO 500MG  2 TIMES DAILY X 10 DAYS  TAKE FLORASTER 2 TIMES DAILY WHILE ON ANTIBIOTICS  Continue other medications as ordered  Follow up with specialists as schedu;ed  Will call with lab results  Push fluids and rest  Follow up with Dr Mariea Clonts as scheduled or sooner if not feeling better   [facility] S. Perlie Gold  [facility] [address] [place], [place] [address] [phone] Cell (Monday-Friday 8 AM - 5 PM) [phone] After 5 PM and follow prompts

## 2018-03-07 ENCOUNTER — Telehealth: Payer: Self-pay | Admitting: Podiatry

## 2018-03-07 MED ORDER — CADEXOMER IODINE 0.9 % EX GEL: 1.0000 "application " | Freq: Every day | CUTANEOUS | 2 refills | Status: DC | PRN

## 2018-03-07 NOTE — Addendum Note (Signed)
Addended by: Harriett Sine D on: 03/07/2018 03:58 PM   Modules accepted: Orders

## 2018-03-07 NOTE — Telephone Encounter (Signed)
Left message informing pt the iodosorb had been called to the [facility], and I apologized for the delay.

## 2018-03-07 NOTE — Progress Notes (Signed)
Subjective:   Patient ID: Katherine Delgado, female   DOB: 77 y.o.   MRN: [id number]   HPI Patient states still getting some drainage in the left hallux and was doing better and it seems like it is come back to a degree.  Patient states is been this way over the last few days   ROS      Objective:  Physical Exam  Neurovascular status unchanged with patient found to have approximate 5 x 5 mm lesion on the plantar aspect of the left hallux that is localized with no current proximal edema erythema or drainage noted     Assessment:  Localized ulcer of the left plantar hallux left that is been chronic in nature and seems to almost heal and then seems to break open again with patient having worn a wedge shoe in the past     Plan:  H&P and sharp sterile debridement of the lesion was accomplished.  I did not note any purulence coming from it and it has approximate 2 mm of depth and I flushed and clean this area out and applied Iodosorb and she will utilize Iodosorb at home along with soaks and padding around the area.  I am going to have her see Dr. March Rummage for second opinion to see if he has any other ideas on how we may get this final spot to heal

## 2018-03-07 NOTE — Telephone Encounter (Signed)
Patient called about [place] that was suppose to be sent to her pharmacy. Please call patient back at  [phone]

## 2018-03-17 ENCOUNTER — Other Ambulatory Visit: Payer: Self-pay | Admitting: Internal Medicine

## 2018-03-18 ENCOUNTER — Other Ambulatory Visit: Payer: Self-pay | Admitting: Internal Medicine

## 2018-03-20 ENCOUNTER — Other Ambulatory Visit: Payer: Self-pay | Admitting: Cardiology

## 2018-03-20 NOTE — Telephone Encounter (Signed)
Rx request sent to pharmacy.  

## 2018-03-27 ENCOUNTER — Other Ambulatory Visit: Payer: Self-pay | Admitting: *Deleted

## 2018-03-27 ENCOUNTER — Telehealth: Payer: Self-pay | Admitting: Neurology

## 2018-03-27 DIAGNOSIS — G8929 Other chronic pain: Secondary | ICD-10-CM

## 2018-03-27 DIAGNOSIS — M545 Low back pain: Principal | ICD-10-CM

## 2018-03-27 MED ORDER — HYDROCODONE-ACETAMINOPHEN 10-325 MG PO TABS
1.0000 | ORAL_TABLET | ORAL | 0 refills | Status: DC | PRN
Start: 2018-03-27 — End: 2018-04-24

## 2018-03-27 NOTE — Telephone Encounter (Signed)
Called pt and schedule work in appt this Friday at [place] with Dr. Jannifer Franklin. Pt verbalized understanding and appreciation.

## 2018-03-27 NOTE — Telephone Encounter (Signed)
Patient has had pain and numbness in her neck and arms x 2 weeks and needs to be seen.  Her next appointment with Dr. Jannifer Franklin is not until November.Please call and discuss.

## 2018-03-27 NOTE — Telephone Encounter (Signed)
Patient called and requested refill [place] Verified LR: 02/26/2018 Pharmacy Confirmed Pended Rx and sent to Dr. Mariea Clonts for approval.

## 2018-03-30 ENCOUNTER — Ambulatory Visit (INDEPENDENT_AMBULATORY_CARE_PROVIDER_SITE_OTHER): Payer: Medicare Other | Admitting: Neurology

## 2018-03-30 ENCOUNTER — Other Ambulatory Visit: Payer: Self-pay | Admitting: Neurology

## 2018-03-30 ENCOUNTER — Encounter: Payer: Self-pay | Admitting: Neurology

## 2018-03-30 VITALS — BP 121/58 | HR 76 | Ht 66.0 in | Wt 265.0 lb

## 2018-03-30 DIAGNOSIS — R269 Unspecified abnormalities of gait and mobility: Secondary | ICD-10-CM

## 2018-03-30 DIAGNOSIS — M47812 Spondylosis without myelopathy or radiculopathy, cervical region: Secondary | ICD-10-CM

## 2018-03-30 DIAGNOSIS — M48061 Spinal stenosis, lumbar region without neurogenic claudication: Secondary | ICD-10-CM | POA: Insufficient documentation

## 2018-03-30 DIAGNOSIS — E1142 Type 2 diabetes mellitus with diabetic polyneuropathy: Secondary | ICD-10-CM

## 2018-03-30 DIAGNOSIS — M48062 Spinal stenosis, lumbar region with neurogenic claudication: Secondary | ICD-10-CM

## 2018-03-30 HISTORY — DX: Spinal stenosis, lumbar region without neurogenic claudication: M48.061

## 2018-03-30 MED ORDER — TIZANIDINE HCL 2 MG PO TABS: 2.0000 mg | ORAL_TABLET | Freq: Three times a day (TID) | ORAL | 1 refills | Status: DC

## 2018-03-30 MED ORDER — PREDNISONE 5 MG PO TABS: 5.0000 mg | ORAL_TABLET | Freq: Every day | ORAL | 0 refills | Status: DC

## 2018-03-30 NOTE — Progress Notes (Signed)
Reason for visit: Low back pain, neck pain  Katherine Delgado is an 77 y.o. female  History of present illness:  Katherine Delgado is a 77 year old right-handed white female with a history of rheumatoid arthritis, followed by Dr. Estanislado Pandy.  The patient has been on [place], she was taken off of the medication 2 months ago because of an infection on the toe.  Within the last 2 weeks, she has developed significant pain in the neck and shoulders, right greater than left with pain down both arms to the hands bilaterally.  The patient has had swelling in the joints of the hands, right greater than left.  The patient has neck stiffness, difficulty turning the head as well.  She reports that the arms feel somewhat weak bilaterally.  The patient has known lumbosacral spinal stenosis at the L4-5 level, she has had an injection in the low back on 29 January 2018 without much benefit.  The patient has had one fall that occurred today.  She may use a cane or a walker for ambulation.  The patient does have some urinary incontinence that developed several months ago.  The patient is concerned that her rheumatoid arthritis is now quite active.  The patient returns for an urgent evaluation.  Past Medical History:  Diagnosis Date  . Abnormal weight gain   . Abnormality of gait 04/19/2016  . Acute infective polyneuritis ([place]) 2002  . Acute maxillary sinusitis   . Acute sinusitis, unspecified   . Allergic rhinitis due to pollen   . Arthritis   . Back injury   . Candidiasis of skin and nails   . Candidiasis of vulva and vagina   . Chronic pain syndrome   . COPD (chronic obstructive pulmonary disease) ([place])   . Cough   . Degenerative arthritis   . Depressive disorder, not elsewhere classified   . Diabetes mellitus without complication ([place])   . Diaphragmatic hernia without mention of obstruction or gangrene   . Dyslipidemia   . Edema   . Fibromyalgia   . GERD (gastroesophageal reflux disease)   . Guillain-Barre  syndrome ([place])   . History of benign thymus tumor   . Hypertension   . Insomnia, unspecified   . Lumbago   . Lumbar spinal stenosis 03/30/2018   L4-5 level, severe  . Miscarriage 1962  . Mixed hyperlipidemia   . Morbid obesity ([place])   . Obstructive chronic bronchitis with acute bronchitis ([place])   . Osteopenia, senile   . Osteoporosis   . Other malaise and fatigue   . Other specified disease of white blood cells   . Pain in joint, multiple sites   . Polyneuropathy in diabetes(357.2)   . RA (rheumatoid arthritis) ([place])   . Reflux esophagitis   . Rheumatoid arthritis(714.0)   . Shortness of breath   . Spinal stenosis, lumbar region, without neurogenic claudication   . Spondylosis, lumbosacral   . Spontaneous ecchymoses   . Stiffness of joints, not elsewhere classified, multiple sites   . Tear film insufficiency, unspecified   . Thyroid disease   . Thyroid disorder   . Type II or unspecified type diabetes mellitus with peripheral circulatory disorders, uncontrolled(250.72)   . Unspecified chronic bronchitis ([place])   . Unspecified essential hypertension   . Unspecified hypothyroidism   . Unspecified pruritic disorder   . Unspecified urinary incontinence   . Urinary tract infection, site not specified     Past Surgical History:  Procedure Laterality Date  . ABDOMINAL  HYSTERECTOMY  1974  . abdominal tumor  2002  . APPENDECTOMY    . [place]  . BRONCHOSCOPY  2001  . CHOLECYSTECTOMY  1984  . KNEE SURGERY Bilateral 08/27/2010 (L) and 01/11/2011 (R)  . [place]  . OVARIAN CYST SURGERY  1968  . thymus tumor    . 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 TONSILLECTOMY      Family History  Problem Relation Age of Onset  . Alzheimer's disease Mother   . Heart disease Mother   . Heart disease Father   . Liver disease Father   . Cancer Brother   . Arthritis Son   . Colon polyps Brother   . Colon cancer Neg Hx   . Esophageal cancer Neg Hx   . Kidney disease Neg Hx   .  Stomach cancer Neg Hx   . Rectal cancer Neg Hx     Social history:  reports that she quit smoking about 38 years ago. Her smoking use included cigarettes. She has never used smokeless tobacco. She reports that she does not drink alcohol or use drugs.    Allergies  Allergen Reactions  . Penicillins Hives, Itching, Swelling and Rash  . Statins Other (See Comments)    Myopathy, transaminitis  . Sulfamethoxazole-Trimethoprim Itching    Medications:  Prior to Admission medications   Medication Sig Start Date End Date Taking? Authorizing Provider  albuterol (PROVENTIL HFA;VENTOLIN HFA) 108 (90 Base) MCG/ACT inhaler Inhale 2 puffs into the lungs every 6 (six) hours as needed for wheezing or shortness of breath. 06/05/17  Yes Reed, Tiffany L, DO  alendronate (FOSAMAX) 70 MG tablet Take 1 tablet (70 mg total) by mouth once a week. Take with a full glass of water on an empty stomach. 04/27/17  Yes Deveshwar, Abel Presto, MD  amLODipine (NORVASC) 10 MG tablet Take 1 tablet (10 mg total) by mouth daily. Please schedule appointment for refills. 03/06/18  Yes Gildardo Cranker, DO  aspirin EC 81 MG tablet Take 81 mg by mouth daily as needed.    Yes [provider]  B-D UF III MINI PEN NEEDLES 31G X 5 MM MISC USE AS DIRECTED 12/18/17  Yes Reed, Tiffany L, DO  cadexomer iodine (IODOSORB) 0.9 % gel Apply 1 application topically daily as needed for wound care. 03/07/18  Yes Regal, Tamala Fothergill, DPM  folic acid (FOLVITE) 1 MG tablet Take 2 tablets (2 mg total) by mouth daily. 11/15/16  Yes Panwala, Naitik, PA-C  furosemide (LASIX) 20 MG tablet TAKE 1 TABLET BY MOUTH TWICE DAILY AS NEEDED FOR 3 POUND WEIGHT GAIN IN ONE DAY OR 5 POUNDS IN ONE WEEK 08/18/17  Yes Reed, Tiffany L, DO  gabapentin (NEURONTIN) 600 MG tablet TAKE 1 TABLET BY MOUTH THREE TIMES DAILY 03/05/18  Yes Reed, Tiffany L, DO  glucose blood (ACCU-CHEK SMARTVIEW) test strip Check blood sugar 3-4 times daily   Yes [provider]  HUMALOG KWIKPEN  200 UNIT/ML SOPN INJECT 20 UNITS UNDER THE SKIN THREE TIMES DAILY BEFORE A MEAL 03/19/18  Yes Reed, Tiffany L, DO  HYDROcodone-acetaminophen (NORCO) 10-325 MG tablet Take 1 tablet by mouth every 3 (three) hours as needed for moderate pain. 03/27/18  Yes Reed, Tiffany L, DO  levothyroxine (SYNTHROID, LEVOTHROID) 137 MCG tablet TAKE 1 TABLET BY MOUTH DAILY BEFORE BREAKFAST ON AN EMPTY STOMACH( LABS OVERDUE) 02/20/18  Yes Reed, Tiffany L, DO  lisinopril (PRINIVIL,ZESTRIL) 20 MG tablet Take 1 tablet (20 mg total) by mouth daily. Please  schedule appointment for refills. 03/20/18  Yes Lelon Perla, MD  loperamide (IMODIUM A-D) 2 MG tablet Take 2 mg by mouth 4 (four) times daily as needed for diarrhea or loose stools.   Yes [provider]  magnesium oxide (MAG-OX) 400 MG tablet Take 400 mg by mouth daily as needed.   Yes [provider]  Melatonin 10 MG CAPS Take 1 capsule by mouth at bedtime   Yes [provider]  metoprolol tartrate (LOPRESSOR) 50 MG tablet Take 50 mg by mouth 2 (two) times daily.  04/26/17  Yes [provider]  MYRBETRIQ 50 MG TB24 tablet TAKE 1 TABLET(50 MG) BY MOUTH DAILY 12/07/17  Yes Reed, Tiffany L, DO  nystatin (NYSTATIN) powder Apply topically daily as needed. 12/15/17  Yes Reed, Tiffany L, DO  omega-3 acid ethyl esters (LOVAZA) 1 g capsule TAKE ONE CAPSULE BY MOUTH EVERY DAY 08/04/16  Yes Reed, Tiffany L, DO  oxyCODONE (OXY IR/ROXICODONE) 5 MG immediate release tablet Take one tablet every 6 hours as needed for severe pain, Take 2 tablets at bedtime. 03/06/18  Yes Reed, Tiffany L, DO  polyvinyl alcohol (LIQUIFILM TEARS) 1.4 % ophthalmic solution Place 1 drop into both eyes as needed (for dry eyes).    Yes [provider]  potassium chloride (MICRO-K) 10 MEQ CR capsule Take 2 capsules by mouth 2 (two) times daily. While on lasix 07/20/16  Yes [provider]  pregabalin (LYRICA) 150 MG capsule Take 1 capsule (150 mg total) by mouth 2  (two) times daily. 10/05/17  Yes Reed, Tiffany L, DO  promethazine (PHENERGAN) 12.5 MG tablet TAKE 1 TABLET BY MOUTH AS NEEDED FOR SEVERE NAUSEA AS DIRECTED 03/06/18  Yes Eulas Post, Monica, DO  saccharomyces boulardii (FLORASTOR) 250 MG capsule Take 1 capsule (250 mg total) by mouth 2 (two) times daily. 03/06/18  Yes Carter, Monica, DO  TOUJEO SOLOSTAR 300 UNIT/ML SOPN INJECT 100 UNITS UNDER THE SKIN EVERY NIGHT AT BEDTIME 09/18/17  Yes Reed, Tiffany L, DO  triamterene-hydrochlorothiazide (MAXZIDE-25) 37.5-25 MG tablet TAKE 1 TABLET BY MOUTH DAILY 03/19/18  Yes Reed, Tiffany L, DO  umeclidinium-vilanterol (ANORO ELLIPTA) 62.5-25 MCG/INH AEPB Inhale 1 puff into the lungs daily. 06/01/17  Yes Reed, Tiffany L, DO  venlafaxine XR (EFFEXOR-XR) 75 MG 24 hr capsule TAKE 1 CAPSULE(75 MG) BY MOUTH DAILY WITH BREAKFAST 01/09/18  Yes Reed, Tiffany L, DO  VICTOZA 18 MG/3ML SOPN ADMINISTER 1.8 MG UNDER THE SKIN DAILY FOR BLOOD SUGAR 03/05/18  Yes Reed, Tiffany L, DO  predniSONE (DELTASONE) 5 MG tablet Take 1 tablet (5 mg total) by mouth daily with breakfast. 03/30/18   Kathrynn Ducking, MD  tiZANidine (ZANAFLEX) 2 MG tablet Take 1 tablet (2 mg total) by mouth 3 (three) times daily. 03/30/18   Kathrynn Ducking, MD  XELJANZ 5 MG TABS TAKE 1 TABLET BY MOUTH TWICE DAILY Patient not taking: Reported on 03/30/2018 12/13/17   Bo Merino, MD    ROS:  Out of a complete 14 system review of symptoms, the patient complains only of the following symptoms, and all other reviewed systems are negative.  Decreased activity, fatigue Eye itching, blurred vision Cough Leg swelling Heat intolerance, excessive thirst Diarrhea, incontinence of the bowels Restless legs Incontinence of the bladder Joint pain, joint swelling, back pain, muscle cramps, neck stiffness Weakness  Blood pressure (!) 121/58, pulse 76, height 5\' 6"  (1.676 m), weight 265 lb (120.2 kg), SpO2 96 %.  Physical Exam  General: The patient is alert and  cooperative at the time of the examination.  The patient is markedly obese.  Skin: 2+ edema below the knees is seen bilaterally.   Neurologic Exam  Mental status: The patient is alert and oriented x 3 at the time of the examination. The patient has apparent normal recent and remote memory, with an apparently normal attention span and concentration ability.   Cranial nerves: Facial symmetry is present. Speech is normal, no aphasia or dysarthria is noted. Extraocular movements are full. Visual fields are full.  Motor: The patient has good strength in all 4 extremities.  Sensory examination: Soft touch sensation is symmetric on the face, arms, and legs.  Coordination: The patient has good finger-nose-finger and heel-to-shin bilaterally.  Gait and station: The patient has a wide-based, slightly unsteady gait.  Tandem gait was not attempted.  Romberg is negative.  Reflexes: Deep tendon reflexes are symmetric, but are depressed.   Assessment/Plan:  1.  Diabetic peripheral neuropathy  2.  Rheumatoid arthritis, off medication  3.  Low back pain, lumbosacral spinal stenosis, L4-5 level  4.  Neck pain, bilateral arm pain, new onset  The patient likely has had a flareup of her rheumatoid arthritis and is having increasing symptoms in the neck and arms and joint pains in the hands.  The patient will be given a 6-day course of prednisone, she will go on tizanidine 2 mg 3 times daily, we will get a physical therapy evaluation through home health into the home environment for neuromuscular therapy.  The patient will have a neurosurgical referral for her lumbosacral spinal stenosis.  She will otherwise will follow-up in 4 months.  The patient will be seeing Dr. Estanislado Pandy on 24 April 2018.  Jill Alexanders MD 03/30/2018 11:32 AM  [facility] [address] [place], [place] [address]  Phone [phone] Fax [phone]

## 2018-03-31 ENCOUNTER — Other Ambulatory Visit: Payer: Self-pay | Admitting: Internal Medicine

## 2018-04-02 ENCOUNTER — Other Ambulatory Visit: Payer: Self-pay | Admitting: Internal Medicine

## 2018-04-02 DIAGNOSIS — N3281 Overactive bladder: Secondary | ICD-10-CM

## 2018-04-04 ENCOUNTER — Other Ambulatory Visit: Payer: Self-pay

## 2018-04-04 DIAGNOSIS — G9332 Myalgic encephalomyelitis/chronic fatigue syndrome: Secondary | ICD-10-CM

## 2018-04-04 DIAGNOSIS — M797 Fibromyalgia: Secondary | ICD-10-CM

## 2018-04-04 DIAGNOSIS — R5382 Chronic fatigue, unspecified: Principal | ICD-10-CM

## 2018-04-04 MED ORDER — OXYCODONE HCL 5 MG PO TABS: ORAL_TABLET | ORAL | 0 refills | Status: DC

## 2018-04-04 NOTE — Addendum Note (Signed)
Addended by: Denyse Amass on: 04/04/2018 11:28 AM   Modules accepted: Orders

## 2018-04-04 NOTE — Telephone Encounter (Signed)
Patient called to request a refill on oxycodone 5 mg. Last refill was 03/06/18. Rx was pended to provider for approval  after verifying last fill date, provider, and quantity on PMP [place]

## 2018-04-05 ENCOUNTER — Encounter: Payer: Self-pay | Admitting: Internal Medicine

## 2018-04-05 ENCOUNTER — Ambulatory Visit (INDEPENDENT_AMBULATORY_CARE_PROVIDER_SITE_OTHER): Payer: Medicare Other | Admitting: Internal Medicine

## 2018-04-05 VITALS — BP 130/68 | HR 58 | Temp 98.2°F | Ht 66.0 in | Wt 260.0 lb

## 2018-04-05 DIAGNOSIS — M545 Low back pain: Secondary | ICD-10-CM

## 2018-04-05 DIAGNOSIS — G8929 Other chronic pain: Secondary | ICD-10-CM

## 2018-04-05 DIAGNOSIS — K1121 Acute sialoadenitis: Secondary | ICD-10-CM | POA: Diagnosis not present

## 2018-04-05 DIAGNOSIS — E1142 Type 2 diabetes mellitus with diabetic polyneuropathy: Secondary | ICD-10-CM

## 2018-04-05 DIAGNOSIS — M0579 Rheumatoid arthritis with rheumatoid factor of multiple sites without organ or systems involvement: Secondary | ICD-10-CM | POA: Diagnosis not present

## 2018-04-05 DIAGNOSIS — L97529 Non-pressure chronic ulcer of other part of left foot with unspecified severity: Secondary | ICD-10-CM | POA: Diagnosis not present

## 2018-04-05 DIAGNOSIS — E11621 Type 2 diabetes mellitus with foot ulcer: Secondary | ICD-10-CM

## 2018-04-05 DIAGNOSIS — E039 Hypothyroidism, unspecified: Secondary | ICD-10-CM

## 2018-04-05 DIAGNOSIS — Z794 Long term (current) use of insulin: Secondary | ICD-10-CM

## 2018-04-05 DIAGNOSIS — L409 Psoriasis, unspecified: Secondary | ICD-10-CM

## 2018-04-05 NOTE — Patient Instructions (Signed)
Come fasting tomorrow morning for fasting labs.

## 2018-04-05 NOTE — Progress Notes (Signed)
Location:  [facility] clinic Provider:   L. Mariea Clonts, D.O., C.M.D.  Code Status: DNR Goals of Care:  Advanced Directives 04/05/2018  Does Patient Have a Medical Advance Directive? Yes  Type of Advance Directive Out of facility DNR (pink MOST or yellow form)  Does patient want to make changes to medical advance directive? No - Patient declined  Copy of [place] in Chart? -  Would patient like information on creating a medical advance directive? -  Pre-existing out of facility DNR order (yellow form or pink MOST form) Pink MOST form placed in chart (order not valid for inpatient use)   Chief Complaint  Patient presents with  . Medical Management of Chronic Issues    60mh follow-up    HPI: Patient is a 77y.o. female seen today for medical management of chronic diseases.    Appears patient got rescheduled   She had a fall getting into the truck and legs gave out, fell and her knee got sore and foot and ankle were all twisted.  She thinks she broke her toes.  She sees Dr RPaulla Dollytomorrow.  In the wheelchair due to her knee and her infected toe.    She saw Dr. CEulas Postabout parotitis.  Treated with a bx and better.  Neck stiff and going down right shoulder and arm.  Now just sore in the sides of her neck.  Dr. WJannifer Franklingave her prednisone and muscle relaxer.  Sees Dr. DEstanislado Pandy9/3.    Had her MRI with Dr. WJannifer Franklinof her lumbar spine in may.  She says she was told nothing would help her except surgery.  She has a consult 8/26.  She had an epidural on 6/10 that didn't help more than a day or two.  Not as much fecal incontinence, is continuing to have urinary incontinence--having to wear pads the past 5-6 mos.    Ulcer on toe.  She's seen Dr. RPaulla Dollymany times about this.  He's been debriding it.  iodosorb cream was recommended, but there have been issues with getting it.    WBC was trending up despite not being on prednisone--also has had hematology eval before and it was felt  to be chronic but not worrisome as not trending up into a range warranting a bone marrow biopsy.  May xrays were negative for osteomyelitis.  Reports now having psoriasis, she thinks behind her ears at the scalp line.  Talked about T gel shampoo.  Has been off xelganz since May b/c of elevated wbc and toe.    Past Medical History:  Diagnosis Date  . Abnormal weight gain   . Abnormality of gait 04/19/2016  . Acute infective polyneuritis ([place] 2002  . Acute maxillary sinusitis   . Acute sinusitis, unspecified   . Allergic rhinitis due to pollen   . Arthritis   . Back injury   . Candidiasis of skin and nails   . Candidiasis of vulva and vagina   . Chronic pain syndrome   . COPD (chronic obstructive pulmonary disease) ([place]   . Cough   . Degenerative arthritis   . Depressive disorder, not elsewhere classified   . Diabetes mellitus without complication ([place]   . Diaphragmatic hernia without mention of obstruction or gangrene   . Dyslipidemia   . Edema   . Fibromyalgia   . GERD (gastroesophageal reflux disease)   . Guillain-Barre syndrome ([place]   . History of benign thymus tumor   . Hypertension   . Insomnia,  unspecified   . Lumbago   . Lumbar spinal stenosis 03/30/2018   L4-5 level, severe  . Miscarriage 1962  . Mixed hyperlipidemia   . Morbid obesity ([place])   . Obstructive chronic bronchitis with acute bronchitis ([place])   . Osteopenia, senile   . Osteoporosis   . Other malaise and fatigue   . Other specified disease of white blood cells   . Pain in joint, multiple sites   . Polyneuropathy in diabetes(357.2)   . RA (rheumatoid arthritis) ([place])   . Reflux esophagitis   . Rheumatoid arthritis(714.0)   . Shortness of breath   . Spinal stenosis, lumbar region, without neurogenic claudication   . Spondylosis, lumbosacral   . Spontaneous ecchymoses   . Stiffness of joints, not elsewhere classified, multiple sites   . Tear film insufficiency, unspecified   . Thyroid disease   .  Thyroid disorder   . Type II or unspecified type diabetes mellitus with peripheral circulatory disorders, uncontrolled(250.72)   . Unspecified chronic bronchitis ([place])   . Unspecified essential hypertension   . Unspecified hypothyroidism   . Unspecified pruritic disorder   . Unspecified urinary incontinence   . Urinary tract infection, site not specified     Past Surgical History:  Procedure Laterality Date  . ABDOMINAL HYSTERECTOMY  1974  . abdominal tumor  2002  . APPENDECTOMY    . [place]  . BRONCHOSCOPY  2001  . CHOLECYSTECTOMY  1984  . KNEE SURGERY Bilateral 08/27/2010 (L) and 01/11/2011 (R)  . [place]  . OVARIAN CYST SURGERY  1968  . thymus tumor    . 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 TONSILLECTOMY      Allergies  Allergen Reactions  . Penicillins Hives, Itching, Swelling and Rash  . Statins Other (See Comments)    Myopathy, transaminitis  . Sulfamethoxazole-Trimethoprim Itching    Outpatient Encounter Medications as of 04/05/2018  Medication Sig  . albuterol (PROVENTIL HFA;VENTOLIN HFA) 108 (90 Base) MCG/ACT inhaler Inhale 2 puffs into the lungs every 6 (six) hours as needed for wheezing or shortness of breath.  Marland Kitchen alendronate (FOSAMAX) 70 MG tablet Take 1 tablet (70 mg total) by mouth once a week. Take with a full glass of water on an empty stomach.  Marland Kitchen amLODipine (NORVASC) 10 MG tablet Take 1 tablet (10 mg total) by mouth daily. Please schedule appointment for refills.  Marland Kitchen aspirin EC 81 MG tablet Take 81 mg by mouth daily as needed.   . B-D UF III MINI PEN NEEDLES 31G X 5 MM MISC USE AS DIRECTED  . cadexomer iodine (IODOSORB) 0.9 % gel Apply 1 application topically daily as needed for wound care.  . folic acid (FOLVITE) 1 MG tablet Take 2 tablets (2 mg total) by mouth daily.  . furosemide (LASIX) 20 MG tablet TAKE 1 TABLET BY MOUTH TWICE DAILY AS NEEDED FOR 3 POUND WEIGHT GAIN IN ONE DAY OR 5 POUNDS IN ONE WEEK  . gabapentin (NEURONTIN) 600 MG tablet TAKE 1  TABLET BY MOUTH THREE TIMES DAILY  . glucose blood (ACCU-CHEK SMARTVIEW) test strip Check blood sugar 3-4 times daily  . HUMALOG KWIKPEN 200 UNIT/ML SOPN INJECT 20 UNITS UNDER THE SKIN THREE TIMES DAILY BEFORE A MEAL  . HYDROcodone-acetaminophen (NORCO) 10-325 MG tablet Take 1 tablet by mouth every 3 (three) hours as needed for moderate pain.  Marland Kitchen levothyroxine (SYNTHROID, LEVOTHROID) 137 MCG tablet TAKE 1 TABLET BY MOUTH DAILY BEFORE BREAKFAST ON AN EMPTY STOMACH( LABS OVERDUE)  .  lisinopril (PRINIVIL,ZESTRIL) 20 MG tablet Take 1 tablet (20 mg total) by mouth daily. Please schedule appointment for refills.  Marland Kitchen loperamide (IMODIUM A-D) 2 MG tablet Take 2 mg by mouth 4 (four) times daily as needed for diarrhea or loose stools.  . magnesium oxide (MAG-OX) 400 MG tablet Take 400 mg by mouth daily as needed.  . Melatonin 10 MG CAPS Take 1 capsule by mouth at bedtime  . metoprolol tartrate (LOPRESSOR) 50 MG tablet Take 50 mg by mouth 2 (two) times daily.   Marland Kitchen MYRBETRIQ 50 MG TB24 tablet TAKE 1 TABLET(50 MG) BY MOUTH DAILY  . nystatin (NYSTATIN) powder Apply topically daily as needed.  Marland Kitchen omega-3 acid ethyl esters (LOVAZA) 1 g capsule TAKE ONE CAPSULE BY MOUTH EVERY DAY  . oxyCODONE (OXY IR/ROXICODONE) 5 MG immediate release tablet Take one tablet every 6 hours as needed for severe pain, Take 2 tablets at bedtime.  . polyvinyl alcohol (LIQUIFILM TEARS) 1.4 % ophthalmic solution Place 1 drop into both eyes as needed (for dry eyes).   . potassium chloride (MICRO-K) 10 MEQ CR capsule Take 2 capsules by mouth 2 (two) times daily. While on lasix  . predniSONE (DELTASONE) 5 MG tablet Take 1 tablet (5 mg total) by mouth daily with breakfast.  . pregabalin (LYRICA) 150 MG capsule Take 1 capsule (150 mg total) by mouth 2 (two) times daily.  Marland Kitchen tiZANidine (ZANAFLEX) 2 MG tablet Take 1 tablet (2 mg total) by mouth 3 (three) times daily.  . TOUJEO SOLOSTAR 300 UNIT/ML SOPN INJECT 100 UNITS UNDER THE SKIN EVERY NIGHT AT  BEDTIME  . triamterene-hydrochlorothiazide (MAXZIDE-25) 37.5-25 MG tablet TAKE 1 TABLET BY MOUTH DAILY  . umeclidinium-vilanterol (ANORO ELLIPTA) 62.5-25 MCG/INH AEPB Inhale 1 puff into the lungs daily.  Marland Kitchen venlafaxine XR (EFFEXOR-XR) 75 MG 24 hr capsule TAKE 1 CAPSULE(75 MG) BY MOUTH DAILY WITH BREAKFAST  . VICTOZA 18 MG/3ML SOPN ADMINISTER 1.8 MG UNDER THE SKIN DAILY FOR BLOOD SUGAR  . XELJANZ 5 MG TABS TAKE 1 TABLET BY MOUTH TWICE DAILY  . [DISCONTINUED] promethazine (PHENERGAN) 12.5 MG tablet TAKE 1 TABLET BY MOUTH AS NEEDED FOR SEVERE NAUSEA AS DIRECTED  . [DISCONTINUED] saccharomyces boulardii (FLORASTOR) 250 MG capsule Take 1 capsule (250 mg total) by mouth 2 (two) times daily.   No facility-administered encounter medications on file as of 04/05/2018.     Review of Systems:  Review of Systems  Constitutional: Positive for malaise/fatigue. Negative for chills, diaphoresis and fever.  HENT: Negative for congestion.   Eyes: Negative for blurred vision.  Respiratory: Negative for cough, sputum production and shortness of breath.   Cardiovascular: Negative for chest pain, palpitations and leg swelling.  Gastrointestinal: Negative for abdominal pain, blood in stool, constipation, diarrhea and melena.  Genitourinary: Negative for dysuria.  Musculoskeletal: Positive for back pain, falls, joint pain, myalgias and neck pain.       Last three toes of right foot; great toe wound left   Skin: Positive for itching and rash.  Neurological: Positive for sensory change. Negative for dizziness and loss of consciousness.  Endo/Heme/Allergies: Does not bruise/bleed easily.  Psychiatric/Behavioral: Negative for depression and memory loss. The patient is not nervous/anxious and does not have insomnia.     Health Maintenance  Topic Date Due  . OPHTHALMOLOGY EXAM  12/20/1950  . FOOT EXAM  09/27/2016  . HEMOGLOBIN A1C  08/23/2017  . INFLUENZA VACCINE  08/22/2019 (Originally 03/22/2018)  . DEXA SCAN   09/20/2025  . TETANUS/TDAP  05/28/2026  .  PNA vac Low Risk Adult  Completed    Physical Exam: Vitals:   04/05/18 0849  BP: 130/68  Pulse: (!) 58  Temp: 98.2 F (36.8 C)  TempSrc: Oral  SpO2: 95%  Weight: 260 lb (117.9 kg)  Height: 5' 6" (1.676 m)   Body mass index is 41.97 kg/m. Physical Exam  Constitutional: She is oriented to person, place, and time. She appears well-developed and well-nourished. No distress.  HENT:  Head: Normocephalic and atraumatic.  Cardiovascular: Normal rate, regular rhythm, normal heart sounds and intact distal pulses.  Pulmonary/Chest: Effort normal and breath sounds normal. No respiratory distress.  Abdominal: Bowel sounds are normal.  Musculoskeletal: Normal range of motion. She exhibits tenderness.  Right lateral knee with small effusion; tender laterally and also tender in paravertebral muscles  Neurological: She is alert and oriented to person, place, and time.  Skin: Skin is warm and dry.  Left great toe with small open place amid callous--tiny blood spot on bandaid--no associated erythema warmth or tenderness; ecchymoses at base of last three toes on right, some mild swelling and erythema there as well  Psychiatric: She has a normal mood and affect.    Labs reviewed: Basic Metabolic Panel: Recent Labs    09/08/17 1015 12/27/17 1352 01/22/18 0941  NA 140 141 139  K 5.0 4.5 4.6  CL 101 101 101  CO2 _0 GLUCOSE 153* 98 198*  BUN _1 CREATININE 0.95* 0.90 0.97*  CALCIUM 9.8 9.6 9.7   Liver Function Tests: Recent Labs    09/08/17 1015 12/27/17 1352 01/22/18 0941  AST 33 42* 26  ALT _2 BILITOT 0.4 0.3 0.5  PROT 6.7 6.9 7.0   No results for input(s): LIPASE, AMYLASE in the last 8760 hours. No results for input(s): AMMONIA in the last 8760 hours. CBC: Recent Labs    12/27/17 1352 01/22/18 0941 03/06/18 0941  WBC 17.3* 13.3* 16.6*  NEUTROABS 13,131* 9,323* 12,915*  HGB 14.5 14.5 13.7  HCT 43.6 43.9  42.1  MCV 83.4 84.1 85.1  PLT 316 305 307   Lipid Panel: No results for input(s): CHOL, HDL, LDLCALC, TRIG, CHOLHDL, LDLDIRECT in the last 8760 hours. Lab Results  Component Value Date   HGBA1C 8.2 (H) 02/20/2017   Assessment/Plan 1. Rheumatoid arthritis involving multiple sites with positive rheumatoid factor ([place]) -worse since coming off xeljanz (due to concerns for infection b/c she has had some chronic elevated wbc x years that has been investigated by hematology and felt to be benign, also then had toe infection, but does not appear actively infected at this time and xray of foot in may was negative for osteo) -pt's pain much worse off her xeljanz  2. Parotitis, acute -resolved after abx  3. Type 2 diabetes mellitus with diabetic polyneuropathy, with long-term current use of insulin (HCC) - needs f/u labs--no hba1c for a year somehow - CBC with Differential/Platelet; Future - COMPLETE METABOLIC PANEL WITH GFR; Future - Hemoglobin A1c; Future - Lipid panel; Future  4. Chronic bilateral low back pain, with sciatica presence unspecified -worse lately, continues on opioid therapy and is scheduled to see rheum again 9/3 -on prednisone and muscle relaxer per neuro and seeing neurosurgery soon for eval  5. Diabetic ulcer of toe of left foot associated with type 2 diabetes mellitus, unspecified ulcer stage (HCC) - no sign of infection at this time - will check cbc in am, but expect wbc still be high due to chronic  leukocytosis already evaluated by hematology, had negative xray for osteo in may, following with podiatry, wound does not appear infected at present - CBC with Differential/Platelet; Future  6. Psoriasis of scalp -new issue, t gel use recommended faithfully  7. Acquired hypothyroidism - cont levothyroxine, f/u lab - TSH; Future  Labs/tests ordered:   Orders Placed This Encounter  Procedures  . CBC with Differential/Platelet    Standing Status:   Future    Standing  Expiration Date:   04/06/2019  . COMPLETE METABOLIC PANEL WITH GFR    Standing Status:   Future    Standing Expiration Date:   04/06/2019  . Hemoglobin A1c    Standing Status:   Future    Standing Expiration Date:   04/06/2019  . Lipid panel    Standing Status:   Future    Standing Expiration Date:   04/06/2019  . TSH    Standing Status:   Future    Standing Expiration Date:   04/06/2019   Next appt:  4 mos med mgt    L. , D.O. [facility] [address], [place] [address] Cell Phone (Mon-Fri 8am-5pm):  [phone] On Call:  [phone] & follow prompts after 5pm & weekends Office Phone:  [phone] Office Fax:  [phone]

## 2018-04-06 ENCOUNTER — Other Ambulatory Visit: Payer: Self-pay | Admitting: Internal Medicine

## 2018-04-06 ENCOUNTER — Ambulatory Visit (INDEPENDENT_AMBULATORY_CARE_PROVIDER_SITE_OTHER): Payer: Medicare Other

## 2018-04-06 ENCOUNTER — Ambulatory Visit (INDEPENDENT_AMBULATORY_CARE_PROVIDER_SITE_OTHER): Payer: Medicare Other | Admitting: Podiatry

## 2018-04-06 ENCOUNTER — Encounter: Payer: Self-pay | Admitting: Podiatry

## 2018-04-06 ENCOUNTER — Other Ambulatory Visit: Payer: Medicare Other

## 2018-04-06 DIAGNOSIS — M0579 Rheumatoid arthritis with rheumatoid factor of multiple sites without organ or systems involvement: Secondary | ICD-10-CM

## 2018-04-06 DIAGNOSIS — L97529 Non-pressure chronic ulcer of other part of left foot with unspecified severity: Secondary | ICD-10-CM

## 2018-04-06 DIAGNOSIS — M19079 Primary osteoarthritis, unspecified ankle and foot: Secondary | ICD-10-CM

## 2018-04-06 DIAGNOSIS — M779 Enthesopathy, unspecified: Principal | ICD-10-CM

## 2018-04-06 DIAGNOSIS — M79676 Pain in unspecified toe(s): Secondary | ICD-10-CM

## 2018-04-06 DIAGNOSIS — L97521 Non-pressure chronic ulcer of other part of left foot limited to breakdown of skin: Secondary | ICD-10-CM | POA: Diagnosis not present

## 2018-04-06 DIAGNOSIS — B351 Tinea unguium: Secondary | ICD-10-CM

## 2018-04-06 DIAGNOSIS — M7751 Other enthesopathy of right foot: Secondary | ICD-10-CM

## 2018-04-06 DIAGNOSIS — E1142 Type 2 diabetes mellitus with diabetic polyneuropathy: Secondary | ICD-10-CM | POA: Diagnosis not present

## 2018-04-06 DIAGNOSIS — E11621 Type 2 diabetes mellitus with foot ulcer: Secondary | ICD-10-CM | POA: Diagnosis not present

## 2018-04-06 DIAGNOSIS — L02612 Cutaneous abscess of left foot: Secondary | ICD-10-CM | POA: Diagnosis not present

## 2018-04-06 DIAGNOSIS — Z794 Long term (current) use of insulin: Secondary | ICD-10-CM

## 2018-04-06 DIAGNOSIS — I739 Peripheral vascular disease, unspecified: Secondary | ICD-10-CM

## 2018-04-06 DIAGNOSIS — E1151 Type 2 diabetes mellitus with diabetic peripheral angiopathy without gangrene: Secondary | ICD-10-CM

## 2018-04-06 DIAGNOSIS — S92501A Displaced unspecified fracture of right lesser toe(s), initial encounter for closed fracture: Secondary | ICD-10-CM

## 2018-04-06 DIAGNOSIS — E039 Hypothyroidism, unspecified: Secondary | ICD-10-CM

## 2018-04-06 DIAGNOSIS — M778 Other enthesopathies, not elsewhere classified: Secondary | ICD-10-CM

## 2018-04-06 DIAGNOSIS — L03032 Cellulitis of left toe: Secondary | ICD-10-CM

## 2018-04-06 DIAGNOSIS — R600 Localized edema: Secondary | ICD-10-CM

## 2018-04-06 DIAGNOSIS — M79609 Pain in unspecified limb: Secondary | ICD-10-CM

## 2018-04-06 NOTE — Telephone Encounter (Signed)
PT [email or web address] @[phone] has called to inform that pt stated she had multiple MD appointments today and has requested to be seen on Monday, she was given the option for PT Roland to come out over the weekend but pt requested Monday.  PT Roland said he made pt aware if may be someone else but for certain some one will be there Monday.

## 2018-04-06 NOTE — Progress Notes (Signed)
Subjective:  Patient ID: Katherine Delgado, female    DOB: 02/05/1941,  MRN: [id number]  Chief Complaint  Patient presents with  . Foot Pain    Rt foot injury 1 week ago, pain and swelling have improved, wearing regular shoe, no pain with ambulation  . Wound Check    Lt sub 1st toe ulcer    77 y.o. female presents with the above complaint.  Reports ulceration under the left big toe.  States she injured her right foot 1 week ago.  It was very painful but is improving.  Also complains of knee pain.  Review of Systems: Negative except as noted in the HPI. Denies N/V/F/Ch.  Past Medical History:  Diagnosis Date  . Abnormal weight gain   . Abnormality of gait 04/19/2016  . Acute infective polyneuritis ([place]) 2002  . Acute maxillary sinusitis   . Acute sinusitis, unspecified   . Allergic rhinitis due to pollen   . Arthritis   . Back injury   . Candidiasis of skin and nails   . Candidiasis of vulva and vagina   . Chronic pain syndrome   . COPD (chronic obstructive pulmonary disease) ([place])   . Cough   . Degenerative arthritis   . Depressive disorder, not elsewhere classified   . Diabetes mellitus without complication ([place])   . Diaphragmatic hernia without mention of obstruction or gangrene   . Dyslipidemia   . Edema   . Fibromyalgia   . GERD (gastroesophageal reflux disease)   . Guillain-Barre syndrome ([place])   . History of benign thymus tumor   . Hypertension   . Insomnia, unspecified   . Lumbago   . Lumbar spinal stenosis 03/30/2018   L4-5 level, severe  . Miscarriage 1962  . Mixed hyperlipidemia   . Morbid obesity ([place])   . Obstructive chronic bronchitis with acute bronchitis ([place])   . Osteopenia, senile   . Osteoporosis   . Other malaise and fatigue   . Other specified disease of white blood cells   . Pain in joint, multiple sites   . Polyneuropathy in diabetes(357.2)   . RA (rheumatoid arthritis) ([place])   . Reflux esophagitis   . Rheumatoid arthritis(714.0)   . Shortness  of breath   . Spinal stenosis, lumbar region, without neurogenic claudication   . Spondylosis, lumbosacral   . Spontaneous ecchymoses   . Stiffness of joints, not elsewhere classified, multiple sites   . Tear film insufficiency, unspecified   . Thyroid disease   . Thyroid disorder   . Type II or unspecified type diabetes mellitus with peripheral circulatory disorders, uncontrolled(250.72)   . Unspecified chronic bronchitis ([place])   . Unspecified essential hypertension   . Unspecified hypothyroidism   . Unspecified pruritic disorder   . Unspecified urinary incontinence   . Urinary tract infection, site not specified     Current Outpatient Medications:  .  albuterol (PROVENTIL HFA;VENTOLIN HFA) 108 (90 Base) MCG/ACT inhaler, Inhale 2 puffs into the lungs every 6 (six) hours as needed for wheezing or shortness of breath., Disp: 3 Inhaler, Rfl: 3 .  alendronate (FOSAMAX) 70 MG tablet, Take 1 tablet (70 mg total) by mouth once a week. Take with a full glass of water on an empty stomach., Disp: 12 tablet, Rfl: 0 .  amLODipine (NORVASC) 10 MG tablet, Take 1 tablet (10 mg total) by mouth daily. Please schedule appointment for refills., Disp: 90 tablet, Rfl: 0 .  aspirin EC 81 MG tablet, Take 81 mg by mouth daily  as needed. , Disp: , Rfl:  .  B-D UF III MINI PEN NEEDLES 31G X 5 MM MISC, USE AS DIRECTED, Disp: 100 each, Rfl: 0 .  cadexomer iodine (IODOSORB) 0.9 % gel, Apply 1 application topically daily as needed for wound care., Disp: 40 g, Rfl: 2 .  folic acid (FOLVITE) 1 MG tablet, Take 2 tablets (2 mg total) by mouth daily., Disp: 180 tablet, Rfl: 4 .  furosemide (LASIX) 20 MG tablet, TAKE 1 TABLET BY MOUTH TWICE DAILY AS NEEDED FOR 3 POUND WEIGHT GAIN IN ONE DAY OR 5 POUNDS IN ONE WEEK, Disp: 60 tablet, Rfl: 0 .  gabapentin (NEURONTIN) 600 MG tablet, TAKE 1 TABLET BY MOUTH THREE TIMES DAILY, Disp: 90 tablet, Rfl: 6 .  glucose blood (ACCU-CHEK SMARTVIEW) test strip, Check blood sugar 3-4 times  daily, Disp: , Rfl:  .  HUMALOG KWIKPEN 200 UNIT/ML SOPN, INJECT 20 UNITS UNDER THE SKIN THREE TIMES DAILY BEFORE A MEAL, Disp: 36 pen, Rfl: 0 .  HYDROcodone-acetaminophen (NORCO) 10-325 MG tablet, Take 1 tablet by mouth every 3 (three) hours as needed for moderate pain., Disp: 240 tablet, Rfl: 0 .  levothyroxine (SYNTHROID, LEVOTHROID) 137 MCG tablet, TAKE 1 TABLET BY MOUTH DAILY BEFORE BREAKFAST ON AN EMPTY STOMACH( LABS OVERDUE), Disp: 90 tablet, Rfl: 0 .  lisinopril (PRINIVIL,ZESTRIL) 20 MG tablet, Take 1 tablet (20 mg total) by mouth daily. Please schedule appointment for refills., Disp: 30 tablet, Rfl: 0 .  loperamide (IMODIUM A-D) 2 MG tablet, Take 2 mg by mouth 4 (four) times daily as needed for diarrhea or loose stools., Disp: , Rfl:  .  magnesium oxide (MAG-OX) 400 MG tablet, Take 400 mg by mouth daily as needed., Disp: , Rfl:  .  Melatonin 10 MG CAPS, Take 1 capsule by mouth at bedtime, Disp: , Rfl:  .  metoprolol tartrate (LOPRESSOR) 50 MG tablet, Take 50 mg by mouth 2 (two) times daily. , Disp: , Rfl:  .  MYRBETRIQ 50 MG TB24 tablet, TAKE 1 TABLET(50 MG) BY MOUTH DAILY, Disp: 30 tablet, Rfl: 1 .  nystatin (NYSTATIN) powder, Apply topically daily as needed., Disp: 60 g, Rfl: 1 .  omega-3 acid ethyl esters (LOVAZA) 1 g capsule, TAKE ONE CAPSULE BY MOUTH EVERY DAY, Disp: 30 capsule, Rfl: 0 .  oxyCODONE (OXY IR/ROXICODONE) 5 MG immediate release tablet, Take one tablet every 6 hours as needed for severe pain, Take 2 tablets at bedtime., Disp: 180 tablet, Rfl: 0 .  polyvinyl alcohol (LIQUIFILM TEARS) 1.4 % ophthalmic solution, Place 1 drop into both eyes as needed (for dry eyes). , Disp: , Rfl:  .  potassium chloride (MICRO-K) 10 MEQ CR capsule, Take 2 capsules by mouth 2 (two) times daily. While on lasix, Disp: , Rfl:  .  predniSONE (DELTASONE) 5 MG tablet, Take 1 tablet (5 mg total) by mouth daily with breakfast., Disp: 21 tablet, Rfl: 0 .  pregabalin (LYRICA) 150 MG capsule, Take 1 capsule  (150 mg total) by mouth 2 (two) times daily., Disp: 60 capsule, Rfl: 3 .  tiZANidine (ZANAFLEX) 2 MG tablet, Take 1 tablet (2 mg total) by mouth 3 (three) times daily., Disp: 90 tablet, Rfl: 1 .  TOUJEO SOLOSTAR 300 UNIT/ML SOPN, INJECT 100 UNITS UNDER THE SKIN EVERY NIGHT AT BEDTIME, Disp: 63 pen, Rfl: 0 .  triamterene-hydrochlorothiazide (MAXZIDE-25) 37.5-25 MG tablet, TAKE 1 TABLET BY MOUTH DAILY, Disp: 90 tablet, Rfl: 0 .  umeclidinium-vilanterol (ANORO ELLIPTA) 62.5-25 MCG/INH AEPB, Inhale 1 puff into the  lungs daily., Disp: 60 each, Rfl: 5 .  venlafaxine XR (EFFEXOR-XR) 75 MG 24 hr capsule, TAKE 1 CAPSULE(75 MG) BY MOUTH DAILY WITH BREAKFAST, Disp: 90 capsule, Rfl: 0 .  VICTOZA 18 MG/3ML SOPN, ADMINISTER 1.8 MG UNDER THE SKIN DAILY FOR BLOOD SUGAR, Disp: 9 mL, Rfl: 6 .  XELJANZ 5 MG TABS, TAKE 1 TABLET BY MOUTH TWICE DAILY, Disp: 60 tablet, Rfl: 0  Social History   Tobacco Use  Smoking Status Former Smoker  . Types: Cigarettes  . Last attempt to quit: 12/07/1979  . Years since quitting: 38.3  Smokeless Tobacco Never Used    Allergies  Allergen Reactions  . Penicillins Hives, Itching, Swelling and Rash  . Statins Other (See Comments)    Myopathy, transaminitis  . Sulfamethoxazole-Trimethoprim Itching   Objective:  There were no vitals filed for this visit. There is no height or weight on file to calculate BMI. Constitutional Well developed. Well nourished.  Vascular Dorsalis pedis pulses palpable bilaterally. Posterior tibial pulses palpable bilaterally. Capillary refill normal to all digits.  No cyanosis or clubbing noted. Pedal hair growth normal.  Neurologic Normal speech. Oriented to person, place, and time. Epicritic sensation to light touch grossly present bilaterally.  Dermatologic Nails well groomed and normal in appearance. Ulceration present to the plantar aspect of the left hallux measuring 0.5x0.5 granular base macerated No skin lesions.  Orthopedic: Normal  joint ROM without pain or crepitus bilaterally. No visible deformities. No bony tenderness.   Radiographs: None Assessment:   1. Diabetic ulcer of toe of left foot associated with type 2 diabetes mellitus, limited to breakdown of skin ([place])   2. Arthritis of big toe   3. Closed fracture of phalanx of right fifth toe, initial encounter   4. Localized edema   5. Diabetes mellitus type 2 with peripheral artery disease ([place])   6. PAD (peripheral artery disease) ([place])   7. Rheumatoid arthritis involving multiple sites with positive rheumatoid factor ([place])   8. Severe obesity (BMI >= 40) ([facility])   9. Type 2 diabetes mellitus with diabetic polyneuropathy, with long-term current use of insulin ([place])   10. Pain due to onychomycosis of nail    Plan:  Patient was evaluated and treated and all questions answered.  Left hallux ulceration -X-rays taken reviewed no underlying fracture dislocation.  Underlying synostosis of the hallux IPJ. -Debridement of the hallux performed as below -Discussed the patient that should the wound to progress significantly would benefit from possible hallux arthroplasty.  Discussed multiple medical comorbidities with patient. -Rest with medihoney DSD advised to continue dressing daily.  Procedure: Excisional Debridement of Wound Rationale: Removal of non-viable soft tissue from the wound to promote healing.  Anesthesia: none Pre-Debridement Wound Measurements: 0.5 cm x 0.5 cm x 0.1 cm  Post-Debridement Wound Measurements: 0.5 cm x 0.5 cm x 0.2 cm  Type of Debridement: Sharp Excisional Tissue Removed: Non-viable soft tissue Depth of Debridement: subcutaneous tissue. Technique: Sharp excisional debridement to bleeding, viable wound base.  Dressing: Dry, sterile, compression dressing. Disposition: Patient tolerated procedure well. Patient to return in 1 week for follow-up.  Diabetes with PAD, Onychomycosis -Educated on diabetic footcare. Diabetic risk level  1 -Nails x10 debrided sharply and manually with large nail nipper and rotary burr.  Procedure: Nail Debridement Rationale: Patient meets criteria for routine foot care due to PAD Type of Debridement: manual, sharp debridement. Instrumentation: Nail nipper, rotary burr. Number of Nails: 10  Return in about 2 weeks (around 04/20/2018) for Wound Care, Left.

## 2018-04-06 NOTE — Telephone Encounter (Signed)
Noted  

## 2018-04-07 LAB — HEMOGLOBIN A1C
Hgb A1c MFr Bld: 9.3 % of total Hgb — ABNORMAL HIGH (ref <5.7)
Mean Plasma Glucose: 220 (calc)
eAG (mmol/L): 12.2 (calc)

## 2018-04-07 LAB — COMPLETE METABOLIC PANEL WITH GFR
AG Ratio: 1.3 (calc) (ref 1.0–2.5)
ALT: 18 U/L (ref 6–29)
AST: 41 U/L — ABNORMAL HIGH (ref 10–35)
Albumin: 3.6 g/dL (ref 3.6–5.1)
Alkaline phosphatase (APISO): 95 U/L (ref 33–130)
BUN: 18 mg/dL (ref 7–25)
CO2: 31 mmol/L (ref 20–32)
Calcium: 9.1 mg/dL (ref 8.6–10.4)
Chloride: 103 mmol/L (ref 98–110)
Creat: 0.93 mg/dL (ref 0.60–0.93)
GFR, Est African American: 69 mL/min/{1.73_m2} (ref >=60)
GFR, Est Non African American: 59 mL/min/{1.73_m2} — ABNORMAL LOW (ref >=60)
Globulin: 2.7 g/dL (calc) (ref 1.9–3.7)
Glucose, Bld: 102 mg/dL — ABNORMAL HIGH (ref 65–99)
Potassium: 4.8 mmol/L (ref 3.5–5.3)
Sodium: 141 mmol/L (ref 135–146)
Total Bilirubin: 0.4 mg/dL (ref 0.2–1.2)
Total Protein: 6.3 g/dL (ref 6.1–8.1)

## 2018-04-07 LAB — CBC WITH DIFFERENTIAL/PLATELET
Basophils Absolute: 116 cells/uL (ref 0–200)
Basophils Relative: 0.8 %
Eosinophils Absolute: 696 cells/uL — ABNORMAL HIGH (ref 15–500)
Eosinophils Relative: 4.8 %
HCT: 39.7 % (ref 35.0–45.0)
Hemoglobin: 13.2 g/dL (ref 11.7–15.5)
Lymphs Abs: 1914 cells/uL (ref 850–3900)
MCH: 27.8 pg (ref 27.0–33.0)
MCHC: 33.2 g/dL (ref 32.0–36.0)
MCV: 83.8 fL (ref 80.0–100.0)
MPV: 10.6 fL (ref 7.5–12.5)
Monocytes Relative: 7.4 %
Neutro Abs: 10701 cells/uL — ABNORMAL HIGH (ref 1500–7800)
Neutrophils Relative %: 73.8 %
Platelets: 346 10*3/uL (ref 140–400)
RBC: 4.74 10*6/uL (ref 3.80–5.10)
RDW: 13 % (ref 11.0–15.0)
Total Lymphocyte: 13.2 %
WBC mixed population: 1073 cells/uL — ABNORMAL HIGH (ref 200–950)
WBC: 14.5 10*3/uL — ABNORMAL HIGH (ref 3.8–10.8)

## 2018-04-07 LAB — LIPID PANEL
Cholesterol: 129 mg/dL (ref <200)
HDL: 29 mg/dL — ABNORMAL LOW (ref >50)
LDL Cholesterol (Calc): 75 mg/dL (calc)
Non-HDL Cholesterol (Calc): 100 mg/dL (calc) (ref <130)
Total CHOL/HDL Ratio: 4.4 (calc) (ref <5.0)
Triglycerides: 145 mg/dL (ref <150)

## 2018-04-07 LAB — TSH: TSH: 0.73 mIU/L (ref 0.40–4.50)

## 2018-04-09 DIAGNOSIS — M069 Rheumatoid arthritis, unspecified: Secondary | ICD-10-CM | POA: Diagnosis not present

## 2018-04-09 DIAGNOSIS — J449 Chronic obstructive pulmonary disease, unspecified: Secondary | ICD-10-CM | POA: Diagnosis not present

## 2018-04-09 DIAGNOSIS — M4807 Spinal stenosis, lumbosacral region: Secondary | ICD-10-CM | POA: Diagnosis not present

## 2018-04-09 DIAGNOSIS — F329 Major depressive disorder, single episode, unspecified: Secondary | ICD-10-CM | POA: Diagnosis not present

## 2018-04-09 DIAGNOSIS — E1140 Type 2 diabetes mellitus with diabetic neuropathy, unspecified: Secondary | ICD-10-CM | POA: Diagnosis not present

## 2018-04-09 DIAGNOSIS — G610 Guillain-Barre syndrome: Secondary | ICD-10-CM | POA: Diagnosis not present

## 2018-04-09 DIAGNOSIS — G894 Chronic pain syndrome: Secondary | ICD-10-CM | POA: Diagnosis not present

## 2018-04-09 DIAGNOSIS — M47812 Spondylosis without myelopathy or radiculopathy, cervical region: Secondary | ICD-10-CM | POA: Diagnosis not present

## 2018-04-09 DIAGNOSIS — M80071D Age-related osteoporosis with current pathological fracture, right ankle and foot, subsequent encounter for fracture with routine healing: Secondary | ICD-10-CM | POA: Diagnosis not present

## 2018-04-09 DIAGNOSIS — I10 Essential (primary) hypertension: Secondary | ICD-10-CM | POA: Diagnosis not present

## 2018-04-10 ENCOUNTER — Ambulatory Visit: Payer: Medicare Other | Admitting: Rheumatology

## 2018-04-11 ENCOUNTER — Telehealth: Payer: Self-pay | Admitting: Podiatry

## 2018-04-11 NOTE — Telephone Encounter (Signed)
[facility] (PT) is calling to see if pt has any weight bearing issues. Consuelo Pandy can be reached on  [phone]

## 2018-04-11 NOTE — Progress Notes (Signed)
Office Visit Note  Patient: Katherine Delgado             Date of Birth: 16-Jan-1941           MRN: [id number]             PCP: Katherine Curry, DO Referring: Katherine Curry, DO Visit Date: 04/24/2018 Occupation: @GUAROCC @  Subjective:  Pain in multiple joints   History of Present Illness: Katherine Delgado is a 77 y.o. femalewith history of seropositive rheumatoid arthritis, osteoarthritis, fibromyalgia, and osteoporosis.   Patient has been off of methotrexate and Morrie Sheldon since May 2019 due to ulceration on her left foot. She reports that she saw Katherine Delgado recently who felt the ulceration was healing appropriately.  She has been wearing a Band-Aid daily. She is following up with Katherine Delgado to be fitted for diabetic shoes soon.  She states she is been having increased discomfort in multiple joints while being off of Xeljanz and methotrexate.  She states the pain is most severe in her right CMC joint.  She states that she has tried icing as well as using icy hot.  She denies any swelling in her hands at this time.  She states that she has pain in bilateral wrist.  She states that about 3 weeks ago she fell when trying to get into her husband's truck.  She states that she fractured several toes and was placed in a soft soft cast by Katherine Delgado.  She states that she continues to have right knee pain after the fall.  She states the pain is so severe when she is walking.  She states that she has not been sleeping well due to the pain.  Her fibromyalgia has also been flaring.  She has chronic fatigue and insomnia.  She has generalized muscle aches and muscle tenderness.  She is having tension and muscle spasms in the trapezius muscles bilaterally.  She continues to have sicca symptoms due to Sjogren's syndrome.  She states that she has chronic lower back pain and had MRI performed on 01/10/2018 which revealed spinal stenosis.  She states that she is unable to have surgery at this time due to other comorbidities.  She  states that she continues to follow-up with Katherine Delgado.  Activities of Daily Living:  Patient reports morning stiffness   all day.   Patient Reports nocturnal pain.  Difficulty dressing/grooming: Denies Difficulty climbing stairs: Reports Difficulty getting out of chair: Reports Difficulty using hands for taps, buttons, cutlery, and/or writing: Reports  Review of Systems  Constitutional: Positive for fatigue.  HENT: Positive for mouth sores. Negative for mouth dryness and nose dryness.   Eyes: Positive for dryness. Negative for pain and visual disturbance.  Respiratory: Negative for cough, hemoptysis, shortness of breath and difficulty breathing.   Cardiovascular: Negative for chest pain, palpitations, hypertension and swelling in legs/feet.  Gastrointestinal: Positive for diarrhea. Negative for blood in stool and constipation.  Endocrine: Negative for increased urination.  Genitourinary: Negative for painful urination.  Musculoskeletal: Positive for arthralgias, joint pain, joint swelling, myalgias, morning stiffness, muscle tenderness and myalgias. Negative for muscle weakness.  Skin: Negative for color change, pallor, rash, hair loss, nodules/bumps, skin tightness, ulcers and sensitivity to sunlight.  Allergic/Immunologic: Negative for susceptible to infections.  Neurological: Negative for dizziness, numbness, headaches and weakness.  Hematological: Negative for swollen glands.  Psychiatric/Behavioral: Positive for sleep disturbance. Negative for depressed mood. The patient is not nervous/anxious.     PMFS History:  Patient Active Problem List   Diagnosis Date Noted  . Lumbar spinal stenosis 03/30/2018  . Ulcer of toe of left foot, with necrosis of bone ([place]) 07/25/2016  . Rheumatoid arthritis with positive rheumatoid factor ([place]) 06/14/2016  . High risk medication use 06/14/2016  . Sjogren's syndrome ([place]) 06/14/2016  . Primary osteoarthritis of both knees 06/14/2016  . DDD  (degenerative disc disease), lumbar 06/14/2016  . Hypothyroidism 06/14/2016  . Osteoporosis 06/14/2016  . Abnormality of gait 04/19/2016  . Type 2 diabetes mellitus with diabetic polyneuropathy, with long-term current use of insulin ([place]) 11/27/2015  . Severe obesity (BMI >= 40) ([place]) 04/21/2014  . Type II or unspecified type diabetes mellitus with peripheral circulatory disorders, uncontrolled(250.72) 09/12/2013  . Headache(784.0) 07/31/2013  . Sinus infection 07/31/2013  . Chronic low back pain 03/14/2013  . Insomnia 12/06/2012  . Nausea alone 12/06/2012  . Diarrhea 12/06/2012  . Urinary incontinence, urge 12/06/2012  . Lumbosacral root lesions, not elsewhere classified 11/27/2012  . Diabetic polyneuropathy associated with type 2 diabetes mellitus ([place]) 11/27/2012  . EDEMA 05/04/2010  . YEAST INFECTION 05/03/2010  . Hyperlipidemia 05/03/2010  . DEPRESSION 05/03/2010  . History of peripheral neuropathy 05/03/2010  . GUILLAIN-BARRE SYNDROME 05/03/2010  . Essential hypertension 05/03/2010  . ALLERGIC RHINITIS 05/03/2010  . PNEUMONIA 05/03/2010  . COPD (chronic obstructive pulmonary disease) ([place]) 05/03/2010  . GERD 05/03/2010  . Fibromyalgia 05/03/2010  . DYSPNEA 05/03/2010  . CHEST PAIN 05/03/2010    Past Medical History:  Diagnosis Date  . Abnormal weight gain   . Abnormality of gait 04/19/2016  . Acute infective polyneuritis ([place]) 2002  . Acute maxillary sinusitis   . Acute sinusitis, unspecified   . Allergic rhinitis due to pollen   . Arthritis   . Back injury   . Candidiasis of skin and nails   . Candidiasis of vulva and vagina   . Chronic pain syndrome   . COPD (chronic obstructive pulmonary disease) ([place])   . Cough   . Degenerative arthritis   . Depressive disorder, not elsewhere classified   . Diabetes mellitus without complication ([place])   . Diaphragmatic hernia without mention of obstruction or gangrene   . Dyslipidemia   . Edema   . Fibromyalgia   . GERD  (gastroesophageal reflux disease)   . Guillain-Barre syndrome ([place])   . History of benign thymus tumor   . Hypertension   . Insomnia, unspecified   . Lumbago   . Lumbar spinal stenosis 03/30/2018   L4-5 level, severe  . Miscarriage 1962  . Mixed hyperlipidemia   . Morbid obesity ([place])   . Obstructive chronic bronchitis with acute bronchitis ([place])   . Osteopenia, senile   . Osteoporosis   . Other malaise and fatigue   . Other specified disease of white blood cells   . Pain in joint, multiple sites   . Polyneuropathy in diabetes(357.2)   . RA (rheumatoid arthritis) ([place])   . Reflux esophagitis   . Rheumatoid arthritis(714.0)   . Shortness of breath   . Spinal stenosis, lumbar region, without neurogenic claudication   . Spondylosis, lumbosacral   . Spontaneous ecchymoses   . Stiffness of joints, not elsewhere classified, multiple sites   . Tear film insufficiency, unspecified   . Thyroid disease   . Thyroid disorder   . Type II or unspecified type diabetes mellitus with peripheral circulatory disorders, uncontrolled(250.72)   . Unspecified chronic bronchitis ([place])   . Unspecified essential hypertension   . Unspecified hypothyroidism   .  Unspecified pruritic disorder   . Unspecified urinary incontinence   . Urinary tract infection, site not specified     Family History  Problem Relation Age of Onset  . Alzheimer's disease Mother   . Heart disease Mother   . Heart disease Father   . Liver disease Father   . Cancer Brother   . Arthritis Son   . Colon polyps Brother   . Colon cancer Neg Hx   . Esophageal cancer Neg Hx   . Kidney disease Neg Hx   . Stomach cancer Neg Hx   . Rectal cancer Neg Hx    Past Surgical History:  Procedure Laterality Date  . ABDOMINAL HYSTERECTOMY  1974  . abdominal tumor  2002  . APPENDECTOMY    .   . BRONCHOSCOPY  2001  . CHOLECYSTECTOMY  1984  . KNEE SURGERY Bilateral 08/27/2010 (L) and 01/11/2011 (R)  . [place]   . OVARIAN CYST SURGERY  1968  . thymus tumor    . 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 TONSILLECTOMY     Social History   Social History Narrative   Walks with cane   Right handed    Caffeine use: Coffee (2 cups every morning)   Tea: sometimes   Soda: none    Objective: Vital Signs: BP 117/65 (BP Location: Left Arm, Patient Position: Sitting, Cuff Size: Normal)   Pulse 83   Ht 5\' 6"  (1.676 m)   Wt 260 lb 12.8 oz (118.3 kg)   BMI 42.09 kg/m    Physical Exam  Constitutional: She is oriented to person, place, and time. She appears well-developed and well-nourished.  HENT:  Head: Normocephalic and atraumatic.  Eyes: Conjunctivae and EOM are normal.  Neck: Normal range of motion.  Cardiovascular: Normal rate, regular rhythm, normal heart sounds and intact distal pulses.  Pulmonary/Chest: Effort normal and breath sounds normal.  Abdominal: Soft. Bowel sounds are normal.  Lymphadenopathy:    She has no cervical adenopathy.  Neurological: She is alert and oriented to person, place, and time.  Skin: Skin is warm and dry. Capillary refill takes less than 2 seconds.  Healing ulceration noted on plantar aspect of left great toe.   Psychiatric: She has a normal mood and affect. Her behavior is normal.  Nursing note and vitals reviewed.    Musculoskeletal Exam: C-spine slightly limited range of motion with lateral rotation.  Thoracic kyphosis noted.  No midline spinal tenderness.  No SI joint tenderness.  She has tenderness in the bilateral trapezius muscles.  Right shoulder full range of motion.  left shoulder slightly limited abduction to 120 degrees.  Elbow joints, MCPs and PIPs, DIPs good range of motion no synovitis. Post-surgical change in left wrist with limited ROM.  She is complete fist formation bilaterally. PIP and DIP synovial thickening. CMC joint synovial thickening noted.  She synovial thickening of several MCP joints.  Hip joints difficult to assess while in a wheelchair.  Left knee  full range of motion with no discomfort.  No warmth or effusion noted. Right knee tenderness to palpation.  Pedal edema bilaterally.    CDAI Exam: CDAI Score: 5.4  Patient Global Assessment: 9 (mm); Provider Global Assessment: 5 (mm) Swollen: 2 ; Tender: 2  Joint Exam      Right  Left  Wrist  Swollen Tender  Swollen Tender     Investigation: No additional findings.  Imaging: Dg Foot Complete Left  Result Date: 04/06/2018 Please see detailed  radiograph report in office note.  Dg Foot Complete Right  Result Date: 04/06/2018 Please see detailed radiograph report in office note.   Recent Labs: Lab Results  Component Value Date   WBC 14.5 (H) 04/06/2018   HGB 13.2 04/06/2018   PLT 346 04/06/2018   NA 141 04/06/2018   K 4.8 04/06/2018   CL 103 04/06/2018   CO2 31 04/06/2018   GLUCOSE 102 (H) 04/06/2018   BUN 18 04/06/2018   CREATININE 0.93 04/06/2018   BILITOT 0.4 04/06/2018   ALKPHOS 113 02/20/2017   AST 41 (H) 04/06/2018   ALT 18 04/06/2018   PROT 6.3 04/06/2018   ALBUMIN 3.9 02/20/2017   CALCIUM 9.1 04/06/2018   GFRAA 69 04/06/2018    Speciality Comments: No specialty comments available.  Procedures:  No procedures performed Allergies: Penicillins; Statins; and Sulfamethoxazole-trimethoprim   Assessment / Plan:     Visit Diagnoses: Rheumatoid arthritis involving multiple sites with positive rheumatoid factor (HCC) - Positive RF, positive anti-CCP:  She bilateral wrist synovitis.  No tenderness or synovitis of MCPs on exam.  She has been off of MTX and Morrie Sheldon since May 2019 due to have an ulceration present on the plantar aspect of the left great toe.  The ulceration appears to be healing well.  She has no drainage or surrounding erythema. A new band-aid was applied today in the office.  We explained the risks of immunosuppression while having an open wound.  She will resume MTX but we advised her to hold off on taking Xeljanz.  She will follow up in 1 month for  evaluation.  We will send in a refill of MTX 0.6 ml once weekly and folic acid 2 mg daily.  She was advised to notify us if she develops increased joint pain or joint swelling.    High risk medication use - Xeljanz and methotrexate are on hold due to recent ulceration on her left toe.  She will resume MTX at this time.  She had CBC and CMP drawn on 04/06/18.  TB gold will be ordered with next labs. - Plan: QuantiFERON-TB Gold Plus  Ulcer of left foot, unspecified ulcer stage ([place]): The ulceration appears to be healing well.  No drainage or erythema was noted.  She was encouraged to keep the wound clean and dry.  She will be following up with Katherine Delgado to be fitted for diabetic shoes soon.   Primary osteoarthritis of both knees: No warmth or effusion noted.  She fell 3 weeks ago, and she developed increased right knee pain.  She has been icing her knee daily.    Sjogren's syndrome with keratoconjunctivitis sicca ([place]): She continues to have sicca symptoms.  She uses OTC products.   Fibromyalgia: She has been having increased frequency and severity of fibromyalgia flares.  She has generalized muscle aches muscle tenderness.  She continues to have chronic insomnia and fatigue.  She has been waking up several times a night due to the pain in her hands and knees.  Age-related osteoporosis without current pathological fracture - PCP follows DEXA scans.   DDD (degenerative disc disease), lumbar:  Chronic pain.  Lumbar MRI performed on 01/10/18 that revealed severe spinal stenosis.  She will continue to follow up with Katherine Delgado.   Other medical conditions are listed as follows:   Other insomnia  History of Guillain-Barre syndrome  History of depression  History of hypertension  History of COPD  History of obesity  History of peripheral neuropathy  History  of diabetes mellitus  History of gastroesophageal reflux (GERD)   Orders: Orders Placed This Encounter  Procedures  . QuantiFERON-TB  Gold Plus   No orders of the defined types were placed in this encounter.   Face-to-face time spent with patient was 30 minutes. Greater than 50% of time was spent in counseling and coordination of care.  Follow-Up Instructions: Return in about 4 weeks (around 05/22/2018) for Rheumatoid arthritis, Osteoarthritis, Fibromyalgia, Osteoporosis.   Ofilia Neas, PA-C   I examined and evaluated the patient with [name] PA.  Patient is in significant discomfort.  She had synovitis over bilateral wrist joints.  She had ulceration on her toe which is healing.  I have advised her to resume methotrexate.  We will see her back in a month.  If there is no problems with the healing process will resume Morrie Sheldon.  The plan of care was discussed as noted above.  Bo Merino, MD  Note - This record has been created using Editor, commissioning.  Chart creation errors have been sought, but may not always  have been located. Such creation errors do not reflect on  the standard of medical care.

## 2018-04-12 DIAGNOSIS — G894 Chronic pain syndrome: Secondary | ICD-10-CM | POA: Diagnosis not present

## 2018-04-12 DIAGNOSIS — E1140 Type 2 diabetes mellitus with diabetic neuropathy, unspecified: Secondary | ICD-10-CM | POA: Diagnosis not present

## 2018-04-12 DIAGNOSIS — M80071D Age-related osteoporosis with current pathological fracture, right ankle and foot, subsequent encounter for fracture with routine healing: Secondary | ICD-10-CM | POA: Diagnosis not present

## 2018-04-12 DIAGNOSIS — M47812 Spondylosis without myelopathy or radiculopathy, cervical region: Secondary | ICD-10-CM | POA: Diagnosis not present

## 2018-04-12 DIAGNOSIS — M069 Rheumatoid arthritis, unspecified: Secondary | ICD-10-CM | POA: Diagnosis not present

## 2018-04-12 DIAGNOSIS — M4807 Spinal stenosis, lumbosacral region: Secondary | ICD-10-CM | POA: Diagnosis not present

## 2018-04-17 DIAGNOSIS — M069 Rheumatoid arthritis, unspecified: Secondary | ICD-10-CM | POA: Diagnosis not present

## 2018-04-17 DIAGNOSIS — M47812 Spondylosis without myelopathy or radiculopathy, cervical region: Secondary | ICD-10-CM | POA: Diagnosis not present

## 2018-04-17 DIAGNOSIS — G894 Chronic pain syndrome: Secondary | ICD-10-CM | POA: Diagnosis not present

## 2018-04-17 DIAGNOSIS — M80071D Age-related osteoporosis with current pathological fracture, right ankle and foot, subsequent encounter for fracture with routine healing: Secondary | ICD-10-CM | POA: Diagnosis not present

## 2018-04-17 DIAGNOSIS — M4807 Spinal stenosis, lumbosacral region: Secondary | ICD-10-CM | POA: Diagnosis not present

## 2018-04-17 DIAGNOSIS — E1140 Type 2 diabetes mellitus with diabetic neuropathy, unspecified: Secondary | ICD-10-CM | POA: Diagnosis not present

## 2018-04-18 ENCOUNTER — Ambulatory Visit (INDEPENDENT_AMBULATORY_CARE_PROVIDER_SITE_OTHER): Payer: Medicare Other | Admitting: Podiatry

## 2018-04-18 ENCOUNTER — Encounter: Payer: Self-pay | Admitting: Podiatry

## 2018-04-18 DIAGNOSIS — M19079 Primary osteoarthritis, unspecified ankle and foot: Secondary | ICD-10-CM

## 2018-04-18 DIAGNOSIS — M2041 Other hammer toe(s) (acquired), right foot: Secondary | ICD-10-CM | POA: Diagnosis not present

## 2018-04-18 DIAGNOSIS — M2042 Other hammer toe(s) (acquired), left foot: Secondary | ICD-10-CM | POA: Diagnosis not present

## 2018-04-18 DIAGNOSIS — Z794 Long term (current) use of insulin: Secondary | ICD-10-CM

## 2018-04-18 DIAGNOSIS — L97509 Non-pressure chronic ulcer of other part of unspecified foot with unspecified severity: Secondary | ICD-10-CM | POA: Diagnosis not present

## 2018-04-18 DIAGNOSIS — E11621 Type 2 diabetes mellitus with foot ulcer: Secondary | ICD-10-CM

## 2018-04-18 DIAGNOSIS — M2012 Hallux valgus (acquired), left foot: Secondary | ICD-10-CM | POA: Diagnosis not present

## 2018-04-18 DIAGNOSIS — E1151 Type 2 diabetes mellitus with diabetic peripheral angiopathy without gangrene: Secondary | ICD-10-CM | POA: Diagnosis not present

## 2018-04-18 DIAGNOSIS — L97521 Non-pressure chronic ulcer of other part of left foot limited to breakdown of skin: Secondary | ICD-10-CM | POA: Diagnosis not present

## 2018-04-18 DIAGNOSIS — M2011 Hallux valgus (acquired), right foot: Secondary | ICD-10-CM

## 2018-04-18 NOTE — Progress Notes (Signed)
Subjective:  Patient ID: Katherine Delgado, female    DOB: Jan 05, 1941,  MRN: [id number]  Chief Complaint  Patient presents with  . Diabetic Ulcer    left foot great toe follow up; pt stated, "everything looks good, no sign of infection, need to get back on medication from Rheumatoid Dr."    77 y.o. female presents for wound care.  States the wound is doing better.  Denies signs of infection.  Thinks that the wound is healed.  Ready to get back to see her rheumatology doctor for possible restarting Xeljanz for RA.  Review of Systems: Negative except as noted in the HPI. Denies N/V/F/Ch.  Past Medical History:  Diagnosis Date  . Abnormal weight gain   . Abnormality of gait 04/19/2016  . Acute infective polyneuritis ([place]) 2002  . Acute maxillary sinusitis   . Acute sinusitis, unspecified   . Allergic rhinitis due to pollen   . Arthritis   . Back injury   . Candidiasis of skin and nails   . Candidiasis of vulva and vagina   . Chronic pain syndrome   . COPD (chronic obstructive pulmonary disease) ([place])   . Cough   . Degenerative arthritis   . Depressive disorder, not elsewhere classified   . Diabetes mellitus without complication ([place])   . Diaphragmatic hernia without mention of obstruction or gangrene   . Dyslipidemia   . Edema   . Fibromyalgia   . GERD (gastroesophageal reflux disease)   . Guillain-Barre syndrome ([place])   . History of benign thymus tumor   . Hypertension   . Insomnia, unspecified   . Lumbago   . Lumbar spinal stenosis 03/30/2018   L4-5 level, severe  . Miscarriage 1962  . Mixed hyperlipidemia   . Morbid obesity ([place])   . Obstructive chronic bronchitis with acute bronchitis ([place])   . Osteopenia, senile   . Osteoporosis   . Other malaise and fatigue   . Other specified disease of white blood cells   . Pain in joint, multiple sites   . Polyneuropathy in diabetes(357.2)   . RA (rheumatoid arthritis) ([place])   . Reflux esophagitis   . Rheumatoid arthritis(714.0)    . Shortness of breath   . Spinal stenosis, lumbar region, without neurogenic claudication   . Spondylosis, lumbosacral   . Spontaneous ecchymoses   . Stiffness of joints, not elsewhere classified, multiple sites   . Tear film insufficiency, unspecified   . Thyroid disease   . Thyroid disorder   . Type II or unspecified type diabetes mellitus with peripheral circulatory disorders, uncontrolled(250.72)   . Unspecified chronic bronchitis ([place])   . Unspecified essential hypertension   . Unspecified hypothyroidism   . Unspecified pruritic disorder   . Unspecified urinary incontinence   . Urinary tract infection, site not specified     Current Outpatient Medications:  .  albuterol (PROVENTIL HFA;VENTOLIN HFA) 108 (90 Base) MCG/ACT inhaler, Inhale 2 puffs into the lungs every 6 (six) hours as needed for wheezing or shortness of breath., Disp: 3 Inhaler, Rfl: 3 .  alendronate (FOSAMAX) 70 MG tablet, Take 1 tablet (70 mg total) by mouth once a week. Take with a full glass of water on an empty stomach., Disp: 12 tablet, Rfl: 0 .  amLODipine (NORVASC) 10 MG tablet, Take 1 tablet (10 mg total) by mouth daily. Please schedule appointment for refills., Disp: 90 tablet, Rfl: 0 .  aspirin EC 81 MG tablet, Take 81 mg by mouth daily as needed. , Disp: ,  Rfl:  .  B-D UF III MINI PEN NEEDLES 31G X 5 MM MISC, USE AS DIRECTED, Disp: 100 each, Rfl: 0 .  cadexomer iodine (IODOSORB) 0.9 % gel, Apply 1 application topically daily as needed for wound care., Disp: 40 g, Rfl: 2 .  folic acid (FOLVITE) 1 MG tablet, Take 2 tablets (2 mg total) by mouth daily., Disp: 180 tablet, Rfl: 4 .  furosemide (LASIX) 20 MG tablet, TAKE 1 TABLET BY MOUTH TWICE DAILY AS NEEDED FOR 3 POUND WEIGHT GAIN IN ONE DAY OR 5 POUNDS IN ONE WEEK, Disp: 60 tablet, Rfl: 0 .  gabapentin (NEURONTIN) 600 MG tablet, TAKE 1 TABLET BY MOUTH THREE TIMES DAILY, Disp: 90 tablet, Rfl: 6 .  glucose blood (ACCU-CHEK SMARTVIEW) test strip, Check blood  sugar 3-4 times daily, Disp: , Rfl:  .  HUMALOG KWIKPEN 200 UNIT/ML SOPN, INJECT 20 UNITS UNDER THE SKIN THREE TIMES DAILY BEFORE A MEAL, Disp: 36 pen, Rfl: 0 .  HYDROcodone-acetaminophen (NORCO) 10-325 MG tablet, Take 1 tablet by mouth every 3 (three) hours as needed for moderate pain., Disp: 240 tablet, Rfl: 0 .  levothyroxine (SYNTHROID, LEVOTHROID) 137 MCG tablet, TAKE 1 TABLET BY MOUTH DAILY BEFORE BREAKFAST ON AN EMPTY STOMACH( LABS OVERDUE), Disp: 90 tablet, Rfl: 0 .  lisinopril (PRINIVIL,ZESTRIL) 20 MG tablet, Take 1 tablet (20 mg total) by mouth daily. Please schedule appointment for refills., Disp: 30 tablet, Rfl: 0 .  loperamide (IMODIUM A-D) 2 MG tablet, Take 2 mg by mouth 4 (four) times daily as needed for diarrhea or loose stools., Disp: , Rfl:  .  magnesium oxide (MAG-OX) 400 MG tablet, Take 400 mg by mouth daily as needed., Disp: , Rfl:  .  Melatonin 10 MG CAPS, Take 1 capsule by mouth at bedtime, Disp: , Rfl:  .  metoprolol tartrate (LOPRESSOR) 50 MG tablet, Take 50 mg by mouth 2 (two) times daily. , Disp: , Rfl:  .  MYRBETRIQ 50 MG TB24 tablet, TAKE 1 TABLET(50 MG) BY MOUTH DAILY, Disp: 30 tablet, Rfl: 1 .  nystatin (NYSTATIN) powder, Apply topically daily as needed., Disp: 60 g, Rfl: 1 .  omega-3 acid ethyl esters (LOVAZA) 1 g capsule, TAKE ONE CAPSULE BY MOUTH EVERY DAY, Disp: 30 capsule, Rfl: 0 .  oxyCODONE (OXY IR/ROXICODONE) 5 MG immediate release tablet, Take one tablet every 6 hours as needed for severe pain, Take 2 tablets at bedtime., Disp: 180 tablet, Rfl: 0 .  polyvinyl alcohol (LIQUIFILM TEARS) 1.4 % ophthalmic solution, Place 1 drop into both eyes as needed (for dry eyes). , Disp: , Rfl:  .  potassium chloride (MICRO-K) 10 MEQ CR capsule, Take 2 capsules by mouth 2 (two) times daily. While on lasix, Disp: , Rfl:  .  predniSONE (DELTASONE) 5 MG tablet, Take 1 tablet (5 mg total) by mouth daily with breakfast., Disp: 21 tablet, Rfl: 0 .  pregabalin (LYRICA) 150 MG  capsule, Take 1 capsule (150 mg total) by mouth 2 (two) times daily., Disp: 60 capsule, Rfl: 3 .  tiZANidine (ZANAFLEX) 2 MG tablet, Take 1 tablet (2 mg total) by mouth 3 (three) times daily., Disp: 90 tablet, Rfl: 1 .  TOUJEO SOLOSTAR 300 UNIT/ML SOPN, INJECT 100 UNITS UNDER THE SKIN EVERY NIGHT AT BEDTIME, Disp: 63 pen, Rfl: 0 .  triamterene-hydrochlorothiazide (MAXZIDE-25) 37.5-25 MG tablet, TAKE 1 TABLET BY MOUTH DAILY, Disp: 90 tablet, Rfl: 0 .  umeclidinium-vilanterol (ANORO ELLIPTA) 62.5-25 MCG/INH AEPB, Inhale 1 puff into the lungs daily., Disp: 60 each,  Rfl: 5 .  venlafaxine XR (EFFEXOR-XR) 75 MG 24 hr capsule, TAKE 1 CAPSULE(75 MG) BY MOUTH DAILY WITH BREAKFAST, Disp: 90 capsule, Rfl: 0 .  VICTOZA 18 MG/3ML SOPN, ADMINISTER 1.8 MG UNDER THE SKIN DAILY FOR BLOOD SUGAR, Disp: 9 mL, Rfl: 6 .  XELJANZ 5 MG TABS, TAKE 1 TABLET BY MOUTH TWICE DAILY, Disp: 60 tablet, Rfl: 0  Social History   Tobacco Use  Smoking Status Former Smoker  . Types: Cigarettes  . Last attempt to quit: 12/07/1979  . Years since quitting: 38.3  Smokeless Tobacco Never Used    Allergies  Allergen Reactions  . Penicillins Hives, Itching, Swelling and Rash  . Statins Other (See Comments)    Myopathy, transaminitis  . Sulfamethoxazole-Trimethoprim Itching   Objective:  There were no vitals filed for this visit. There is no height or weight on file to calculate BMI. Constitutional Well developed. Well nourished.  Vascular Dorsalis pedis pulses palpable bilaterally. Posterior tibial pulses palpable bilaterally. Capillary refill normal to all digits.  No cyanosis or clubbing noted. Pedal hair growth normal.  Neurologic Normal speech. Oriented to person, place, and time. Protective sensation absent  Dermatologic Wound Location: Left hallux IPJ plantar  Wound Base: Granular/Healthy Peri-wound: Calloused Exudate: None: wound tissue dry Wound Measurements: -8/28: 0.3 x 0.3  Orthopedic: No pain to palpation  either foot.   Radiographs: None today Assessment:   1. Arthritis of big toe   2. Diabetic ulcer of toe of left foot associated with type 2 diabetes mellitus, limited to breakdown of skin ([place])   3. Diabetes mellitus type 2 with peripheral artery disease ([place])   4. Type 2 diabetes mellitus with foot ulcer, with long-term current use of insulin ([place])    Plan:  Patient was evaluated and treated and all questions answered.  DM with foot ulcer, JP deformity, hammertoe deformity -Would benefit from diabetic shoes with offloading of hallux ulceration -We will make appointment for patient for fabrication of devices.  Hallux rigidus left -As wound appears to be healing we will hold off plans for surgical correction of hallux IPJ arthritis. -Should wound failed to progress would consider possible surgical correction for hallux IPJ arthritis however patient will need to be medically optimized for the procedure as she presents multiple comorbidities  Ulcer left hallux -Debridement as below. -Dressed with medihoney and DSD, DSD. -Continue off-loading with surgical shoe.  Procedure: Excisional Debridement of Wound Rationale: Removal of non-viable soft tissue from the wound to promote healing.  Anesthesia: none Pre-Debridement Wound Measurements: Overlying hyperkeratosis Post-Debridement Wound Measurements: 0.3 cm x 0.3 cm x 0.1 cm  Type of Debridement: Sharp Excisional Tissue Removed: Non-viable soft tissue Depth of Debridement: subcutaneous tissue. Technique: Sharp excisional debridement to bleeding, viable wound base.  Dressing: Dry, sterile, compression dressing. Disposition: Patient tolerated procedure well. Patient to return in 1 week for follow-up.   Return in about 2 weeks (around 05/02/2018) for wound care.

## 2018-04-20 ENCOUNTER — Ambulatory Visit: Payer: Medicare Other | Admitting: Podiatry

## 2018-04-20 NOTE — Patient Instructions (Signed)
Standing Labs We placed an order today for your standing lab work.    Please come back and get your standing labs in November and then every 3 months.  We have open lab Monday through Friday from 8:30-11:30 AM and 1:30-4:00 PM  at the office of Dr. Bo Merino.   You may experience shorter wait times on Monday and Friday afternoons. The office is located at [address] [place], [place], [place] [address] No appointment is necessary.   Labs are drawn by [facility].  You may receive a bill from [facility] for your lab work. If you have any questions regarding directions or hours of operation,  please call [phone].    Vaccines You are taking a medication(s) that can suppress your immune system.  The following immunizations are recommended:  . Flu annually . Pneumonia . Shingles . Hepatitis B  Please check with your PCP to make sure you are up to date.

## 2018-04-24 ENCOUNTER — Other Ambulatory Visit: Payer: Self-pay | Admitting: Cardiology

## 2018-04-24 ENCOUNTER — Other Ambulatory Visit: Payer: Self-pay | Admitting: *Deleted

## 2018-04-24 ENCOUNTER — Ambulatory Visit (INDEPENDENT_AMBULATORY_CARE_PROVIDER_SITE_OTHER): Payer: Medicare Other | Admitting: Rheumatology

## 2018-04-24 ENCOUNTER — Encounter: Payer: Self-pay | Admitting: Rheumatology

## 2018-04-24 VITALS — BP 117/65 | HR 83 | Ht 66.0 in | Wt 260.8 lb

## 2018-04-24 DIAGNOSIS — G8929 Other chronic pain: Secondary | ICD-10-CM

## 2018-04-24 DIAGNOSIS — Z8719 Personal history of other diseases of the digestive system: Secondary | ICD-10-CM

## 2018-04-24 DIAGNOSIS — M797 Fibromyalgia: Secondary | ICD-10-CM | POA: Diagnosis not present

## 2018-04-24 DIAGNOSIS — G4709 Other insomnia: Secondary | ICD-10-CM | POA: Diagnosis not present

## 2018-04-24 DIAGNOSIS — M0579 Rheumatoid arthritis with rheumatoid factor of multiple sites without organ or systems involvement: Secondary | ICD-10-CM

## 2018-04-24 DIAGNOSIS — Z8679 Personal history of other diseases of the circulatory system: Secondary | ICD-10-CM | POA: Diagnosis not present

## 2018-04-24 DIAGNOSIS — M810 Age-related osteoporosis without current pathological fracture: Secondary | ICD-10-CM

## 2018-04-24 DIAGNOSIS — Z79899 Other long term (current) drug therapy: Secondary | ICD-10-CM | POA: Diagnosis not present

## 2018-04-24 DIAGNOSIS — M3501 Sicca syndrome with keratoconjunctivitis: Secondary | ICD-10-CM | POA: Diagnosis not present

## 2018-04-24 DIAGNOSIS — L97529 Non-pressure chronic ulcer of other part of left foot with unspecified severity: Secondary | ICD-10-CM | POA: Diagnosis not present

## 2018-04-24 DIAGNOSIS — Z8659 Personal history of other mental and behavioral disorders: Secondary | ICD-10-CM

## 2018-04-24 DIAGNOSIS — M170 Bilateral primary osteoarthritis of knee: Secondary | ICD-10-CM | POA: Diagnosis not present

## 2018-04-24 DIAGNOSIS — Z8709 Personal history of other diseases of the respiratory system: Secondary | ICD-10-CM

## 2018-04-24 DIAGNOSIS — M5136 Other intervertebral disc degeneration, lumbar region: Secondary | ICD-10-CM

## 2018-04-24 DIAGNOSIS — M545 Low back pain: Principal | ICD-10-CM

## 2018-04-24 DIAGNOSIS — Z8669 Personal history of other diseases of the nervous system and sense organs: Secondary | ICD-10-CM

## 2018-04-24 DIAGNOSIS — Z8639 Personal history of other endocrine, nutritional and metabolic disease: Secondary | ICD-10-CM

## 2018-04-24 MED ORDER — HYDROCODONE-ACETAMINOPHEN 10-325 MG PO TABS: 1.0000 | ORAL_TABLET | ORAL | 0 refills | Status: DC | PRN

## 2018-04-24 NOTE — Telephone Encounter (Signed)
Patient requested refill [place] Database Verified LR: 03/27/2018 Pended Rx and sent to Dr. Bubba Camp for approval. (Dr. Cyndi Lennert patient)

## 2018-04-25 ENCOUNTER — Telehealth: Payer: Self-pay | Admitting: *Deleted

## 2018-04-25 DIAGNOSIS — M069 Rheumatoid arthritis, unspecified: Secondary | ICD-10-CM | POA: Diagnosis not present

## 2018-04-25 DIAGNOSIS — E1140 Type 2 diabetes mellitus with diabetic neuropathy, unspecified: Secondary | ICD-10-CM | POA: Diagnosis not present

## 2018-04-25 DIAGNOSIS — G894 Chronic pain syndrome: Secondary | ICD-10-CM | POA: Diagnosis not present

## 2018-04-25 DIAGNOSIS — M80071D Age-related osteoporosis with current pathological fracture, right ankle and foot, subsequent encounter for fracture with routine healing: Secondary | ICD-10-CM | POA: Diagnosis not present

## 2018-04-25 DIAGNOSIS — M47812 Spondylosis without myelopathy or radiculopathy, cervical region: Secondary | ICD-10-CM | POA: Diagnosis not present

## 2018-04-25 DIAGNOSIS — M4807 Spinal stenosis, lumbosacral region: Secondary | ICD-10-CM | POA: Diagnosis not present

## 2018-04-25 NOTE — Telephone Encounter (Signed)
Faxed signed orders back to [facility] ([facility]) re: POC. Fax: [phone]. Received fax confirmation.

## 2018-04-25 NOTE — Telephone Encounter (Signed)
Rx request sent to pharmacy.  

## 2018-04-27 DIAGNOSIS — M4807 Spinal stenosis, lumbosacral region: Secondary | ICD-10-CM | POA: Diagnosis not present

## 2018-04-27 DIAGNOSIS — E1140 Type 2 diabetes mellitus with diabetic neuropathy, unspecified: Secondary | ICD-10-CM | POA: Diagnosis not present

## 2018-04-27 DIAGNOSIS — M069 Rheumatoid arthritis, unspecified: Secondary | ICD-10-CM | POA: Diagnosis not present

## 2018-04-27 DIAGNOSIS — G894 Chronic pain syndrome: Secondary | ICD-10-CM | POA: Diagnosis not present

## 2018-04-27 DIAGNOSIS — M80071D Age-related osteoporosis with current pathological fracture, right ankle and foot, subsequent encounter for fracture with routine healing: Secondary | ICD-10-CM | POA: Diagnosis not present

## 2018-04-27 DIAGNOSIS — M47812 Spondylosis without myelopathy or radiculopathy, cervical region: Secondary | ICD-10-CM | POA: Diagnosis not present

## 2018-05-01 DIAGNOSIS — M47812 Spondylosis without myelopathy or radiculopathy, cervical region: Secondary | ICD-10-CM | POA: Diagnosis not present

## 2018-05-01 DIAGNOSIS — G894 Chronic pain syndrome: Secondary | ICD-10-CM | POA: Diagnosis not present

## 2018-05-01 DIAGNOSIS — M80071D Age-related osteoporosis with current pathological fracture, right ankle and foot, subsequent encounter for fracture with routine healing: Secondary | ICD-10-CM | POA: Diagnosis not present

## 2018-05-01 DIAGNOSIS — E1140 Type 2 diabetes mellitus with diabetic neuropathy, unspecified: Secondary | ICD-10-CM | POA: Diagnosis not present

## 2018-05-01 DIAGNOSIS — M069 Rheumatoid arthritis, unspecified: Secondary | ICD-10-CM | POA: Diagnosis not present

## 2018-05-01 DIAGNOSIS — M4807 Spinal stenosis, lumbosacral region: Secondary | ICD-10-CM | POA: Diagnosis not present

## 2018-05-03 ENCOUNTER — Ambulatory Visit: Payer: Medicare Other | Admitting: Orthotics

## 2018-05-03 ENCOUNTER — Other Ambulatory Visit: Payer: Self-pay

## 2018-05-03 ENCOUNTER — Ambulatory Visit: Payer: Medicare Other | Admitting: Podiatry

## 2018-05-03 ENCOUNTER — Telehealth: Payer: Self-pay | Admitting: Rheumatology

## 2018-05-03 DIAGNOSIS — E1140 Type 2 diabetes mellitus with diabetic neuropathy, unspecified: Secondary | ICD-10-CM | POA: Diagnosis not present

## 2018-05-03 DIAGNOSIS — R5382 Chronic fatigue, unspecified: Principal | ICD-10-CM

## 2018-05-03 DIAGNOSIS — M797 Fibromyalgia: Secondary | ICD-10-CM

## 2018-05-03 DIAGNOSIS — M069 Rheumatoid arthritis, unspecified: Secondary | ICD-10-CM | POA: Diagnosis not present

## 2018-05-03 DIAGNOSIS — M4807 Spinal stenosis, lumbosacral region: Secondary | ICD-10-CM | POA: Diagnosis not present

## 2018-05-03 DIAGNOSIS — M47812 Spondylosis without myelopathy or radiculopathy, cervical region: Secondary | ICD-10-CM | POA: Diagnosis not present

## 2018-05-03 DIAGNOSIS — M80071D Age-related osteoporosis with current pathological fracture, right ankle and foot, subsequent encounter for fracture with routine healing: Secondary | ICD-10-CM | POA: Diagnosis not present

## 2018-05-03 DIAGNOSIS — G894 Chronic pain syndrome: Secondary | ICD-10-CM | POA: Diagnosis not present

## 2018-05-03 MED ORDER — OXYCODONE HCL 5 MG PO TABS: ORAL_TABLET | ORAL | 0 refills | Status: DC

## 2018-05-03 NOTE — Telephone Encounter (Signed)
Her ulcer was not healing process and there was no infection.  She may discontinue methotrexate and restart Morrie Sheldon.  Please advise her to keep an eye on the ulcer and if it changes she should notify us and also go to the wound center.

## 2018-05-03 NOTE — Telephone Encounter (Signed)
Patient called stating she did not pick up the prescription of Methotrexate because "it has caused her problems in the past."  Patient states she "doesn't see a reason to take Methotrexate for 1 month and then start back on Xeljanz."  Patient also stated  her right knee is painful and she thinks she might need an x-ray because she is unable to walk and needs to use a wheelchair.  Patient requested a return call.

## 2018-05-03 NOTE — Telephone Encounter (Signed)
Patient states she had nausea and diarrhea as well joint pain with MTX. Patient states she would like to not go back on MTX. Patient would like to know if there is something else she can use until she can go back on [place]. Patient states she is having trouble in her right knee and is unable to walk. Patient states she had a fall in August 2019. Patient states she can't put any pressure on her knee due to pain. Patient states moving and bending are painful. Patient has seen Dr. Sharol Given as an orthopedic. Please advise regarding medications. Thanks!

## 2018-05-03 NOTE — Telephone Encounter (Signed)
Patient called requesting refill on Oxycodone, last filled 04/05/18  [place] Database verified and compliance confirmed   Please advise

## 2018-05-03 NOTE — Telephone Encounter (Signed)
Patient advised she may discontinue the MTX and restart on Xeljanz. Patient advised to keep an eye on her ulcer and if it changes to contact our office and the wound center. patient verbalized understanding.

## 2018-05-08 ENCOUNTER — Other Ambulatory Visit: Payer: Self-pay | Admitting: Internal Medicine

## 2018-05-09 NOTE — Progress Notes (Deleted)
Office Visit Note  Patient: Katherine Delgado             Date of Birth: 1941/07/21           MRN: [id number]             PCP: Gayland Curry, DO Referring: Gayland Curry, DO Visit Date: 05/23/2018 Occupation: @GUAROCC @  Subjective:  No chief complaint on file.   History of Present Illness: Katherine Delgado is a 77 y.o. female ***   Activities of Daily Living:  Patient reports morning stiffness for *** {minute/hour:19697}.   Patient {ACTIONS;DENIES/REPORTS:21021675::"Denies"} nocturnal pain.  Difficulty dressing/grooming: {ACTIONS;DENIES/REPORTS:21021675::"Denies"} Difficulty climbing stairs: {ACTIONS;DENIES/REPORTS:21021675::"Denies"} Difficulty getting out of chair: {ACTIONS;DENIES/REPORTS:21021675::"Denies"} Difficulty using hands for taps, buttons, cutlery, and/or writing: {ACTIONS;DENIES/REPORTS:21021675::"Denies"}  No Rheumatology ROS completed.   PMFS History:  Patient Active Problem List   Diagnosis Date Noted  . Lumbar spinal stenosis 03/30/2018  . Ulcer of toe of left foot, with necrosis of bone ([place]) 07/25/2016  . Rheumatoid arthritis with positive rheumatoid factor ([place]) 06/14/2016  . High risk medication use 06/14/2016  . Sjogren's syndrome ([place]) 06/14/2016  . Primary osteoarthritis of both knees 06/14/2016  . DDD (degenerative disc disease), lumbar 06/14/2016  . Hypothyroidism 06/14/2016  . Osteoporosis 06/14/2016  . Abnormality of gait 04/19/2016  . Type 2 diabetes mellitus with diabetic polyneuropathy, with long-term current use of insulin ([place]) 11/27/2015  . Severe obesity (BMI >= 40) ([place]) 04/21/2014  . Type II or unspecified type diabetes mellitus with peripheral circulatory disorders, uncontrolled(250.72) 09/12/2013  . Headache(784.0) 07/31/2013  . Sinus infection 07/31/2013  . Chronic low back pain 03/14/2013  . Insomnia 12/06/2012  . Nausea alone 12/06/2012  . Diarrhea 12/06/2012  . Urinary incontinence, urge 12/06/2012  . Lumbosacral root lesions,  not elsewhere classified 11/27/2012  . Diabetic polyneuropathy associated with type 2 diabetes mellitus ([place]) 11/27/2012  . EDEMA 05/04/2010  . YEAST INFECTION 05/03/2010  . Hyperlipidemia 05/03/2010  . DEPRESSION 05/03/2010  . History of peripheral neuropathy 05/03/2010  . GUILLAIN-BARRE SYNDROME 05/03/2010  . Essential hypertension 05/03/2010  . ALLERGIC RHINITIS 05/03/2010  . PNEUMONIA 05/03/2010  . COPD (chronic obstructive pulmonary disease) ([place]) 05/03/2010  . GERD 05/03/2010  . Fibromyalgia 05/03/2010  . DYSPNEA 05/03/2010  . CHEST PAIN 05/03/2010    Past Medical History:  Diagnosis Date  . Abnormal weight gain   . Abnormality of gait 04/19/2016  . Acute infective polyneuritis ([place]) 2002  . Acute maxillary sinusitis   . Acute sinusitis, unspecified   . Allergic rhinitis due to pollen   . Arthritis   . Back injury   . Candidiasis of skin and nails   . Candidiasis of vulva and vagina   . Chronic pain syndrome   . COPD (chronic obstructive pulmonary disease) ([place])   . Cough   . Degenerative arthritis   . Depressive disorder, not elsewhere classified   . Diabetes mellitus without complication ([place])   . Diaphragmatic hernia without mention of obstruction or gangrene   . Dyslipidemia   . Edema   . Fibromyalgia   . GERD (gastroesophageal reflux disease)   . Guillain-Barre syndrome ([place])   . History of benign thymus tumor   . Hypertension   . Insomnia, unspecified   . Lumbago   . Lumbar spinal stenosis 03/30/2018   L4-5 level, severe  . Miscarriage 1962  . Mixed hyperlipidemia   . Morbid obesity ([place])   . Obstructive chronic bronchitis with acute bronchitis ([place])   . Osteopenia, senile   .  Osteoporosis   . Other malaise and fatigue   . Other specified disease of white blood cells   . Pain in joint, multiple sites   . Polyneuropathy in diabetes(357.2)   . RA (rheumatoid arthritis) ([place])   . Reflux esophagitis   . Rheumatoid arthritis(714.0)   . Shortness of  breath   . Spinal stenosis, lumbar region, without neurogenic claudication   . Spondylosis, lumbosacral   . Spontaneous ecchymoses   . Stiffness of joints, not elsewhere classified, multiple sites   . Tear film insufficiency, unspecified   . Thyroid disease   . Thyroid disorder   . Type II or unspecified type diabetes mellitus with peripheral circulatory disorders, uncontrolled(250.72)   . Unspecified chronic bronchitis ([place])   . Unspecified essential hypertension   . Unspecified hypothyroidism   . Unspecified pruritic disorder   . Unspecified urinary incontinence   . Urinary tract infection, site not specified     Family History  Problem Relation Age of Onset  . Alzheimer's disease Mother   . Heart disease Mother   . Heart disease Father   . Liver disease Father   . Cancer Brother   . Arthritis Son   . Colon polyps Brother   . Colon cancer Neg Hx   . Esophageal cancer Neg Hx   . Kidney disease Neg Hx   . Stomach cancer Neg Hx   . Rectal cancer Neg Hx    Past Surgical History:  Procedure Laterality Date  . ABDOMINAL HYSTERECTOMY  1974  . abdominal tumor  2002  . APPENDECTOMY    . Cataract  . BRONCHOSCOPY  2001  . CHOLECYSTECTOMY  1984  . KNEE SURGERY Bilateral 08/27/2010 (L) and 01/11/2011 (R)  . [place]  . OVARIAN CYST SURGERY  1968  . thymus tumor    . 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 TONSILLECTOMY     Social History   Social History Narrative   Walks with cane   Right handed    Caffeine use: Coffee (2 cups every morning)   Tea: sometimes   Soda: none    Objective: Vital Signs: There were no vitals taken for this visit.   Physical Exam   Musculoskeletal Exam: ***  CDAI Exam: CDAI Score: Not documented Patient Global Assessment: Not documented; Provider Global Assessment: Not documented Swollen: Not documented; Tender: Not documented Joint Exam   Not documented   There is currently no information documented on the homunculus. Go to  the Rheumatology activity and complete the homunculus joint exam.  Investigation: No additional findings.  Imaging: No results found.  Recent Labs: Lab Results  Component Value Date   WBC 14.5 (H) 04/06/2018   HGB 13.2 04/06/2018   PLT 346 04/06/2018   NA 141 04/06/2018   K 4.8 04/06/2018   CL 103 04/06/2018   CO2 31 04/06/2018   GLUCOSE 102 (H) 04/06/2018   BUN 18 04/06/2018   CREATININE 0.93 04/06/2018   BILITOT 0.4 04/06/2018   ALKPHOS 113 02/20/2017   AST 41 (H) 04/06/2018   ALT 18 04/06/2018   PROT 6.3 04/06/2018   ALBUMIN 3.9 02/20/2017   CALCIUM 9.1 04/06/2018   GFRAA 69 04/06/2018    Speciality Comments: No specialty comments available.  Procedures:  No procedures performed Allergies: Penicillins; Statins; and Sulfamethoxazole-trimethoprim   Assessment / Plan:     Visit Diagnoses: Rheumatoid arthritis involving multiple sites with positive rheumatoid factor (HCC) -  Positive RF, positive anti-CCP  High risk medication  use - Xeljanz and methotrexate are on hold due to recent ulceration on her left toe.  She will resume MTX at this time  Ulcer of left foot, unspecified ulcer stage (HCC)  Primary osteoarthritis of both knees  Sjogren's syndrome with keratoconjunctivitis sicca (HCC)  Fibromyalgia  Age-related osteoporosis without current pathological fracture  DDD (degenerative disc disease), lumbar  Other insomnia  History of Guillain-Barre syndrome  History of depression  History of gastroesophageal reflux (GERD)  History of peripheral neuropathy  History of hypertension  History of COPD  History of diabetes mellitus   Orders: No orders of the defined types were placed in this encounter.  No orders of the defined types were placed in this encounter.   Face-to-face time spent with patient was *** minutes. Greater than 50% of time was spent in counseling and coordination of care.  Follow-Up Instructions: No follow-ups on  file.   Ofilia Neas, PA-C  Note - This record has been created using Dragon software.  Chart creation errors have been sought, but may not always  have been located. Such creation errors do not reflect on  the standard of medical care.

## 2018-05-16 ENCOUNTER — Telehealth: Payer: Self-pay | Admitting: Neurology

## 2018-05-16 DIAGNOSIS — M48062 Spinal stenosis, lumbar region with neurogenic claudication: Secondary | ICD-10-CM

## 2018-05-16 NOTE — Telephone Encounter (Signed)
Pt states she has the upcoming wedding of her son to attend and she'd like to know if Dr Jannifer Franklin can send the request for another injection to be at the location where she had her last MRI.  Pt said the last she had didn't help much but she needs some relief from her back pain.  Please call

## 2018-05-16 NOTE — Telephone Encounter (Signed)
Called and spoke with pt. I asked if she was seen by [facility] after Dr. Jannifer Franklin placed referral at last visit.  She states she had an appt about 2-3 weeks ago and cx d/t knee issues she was having at the time after a fall. I highly recommended she call them back to get rescheduled as they can get booked out quickly.   She is wanting to know if she can have another ESI to help with back pain in the meantime. Her son's wedding is on 05/26/18. I advised I would send Dr. Jannifer Franklin a message to see if he would approve this. She verbalized understanding.

## 2018-05-16 NOTE — Addendum Note (Signed)
Addended by: Kathrynn Ducking on: 05/16/2018 01:08 PM   Modules accepted: Orders

## 2018-05-16 NOTE — Telephone Encounter (Signed)
I called the patient. The patient is having ongoing back pain,we will get an epidural injection.  The patient has severe spinal stenosis L4-5 level and moderate to severe stenosis at the L3-4 level.  She indicates that after the wedding, she will try to contact the neurosurgery office to get the low back issue evaluated by a surgeon.

## 2018-05-17 ENCOUNTER — Other Ambulatory Visit: Payer: Self-pay | Admitting: Neurology

## 2018-05-17 DIAGNOSIS — M48062 Spinal stenosis, lumbar region with neurogenic claudication: Secondary | ICD-10-CM

## 2018-05-18 ENCOUNTER — Other Ambulatory Visit: Payer: Self-pay | Admitting: Internal Medicine

## 2018-05-18 NOTE — Progress Notes (Signed)
Office Visit Note  Patient: Katherine Delgado             Date of Birth: 18-Nov-1940           MRN: [id number]             PCP: Gayland Curry, DO Referring: Gayland Curry, DO Visit Date: 05/24/2018 Occupation: @GUAROCC @  Subjective:  Right knee pain  History of Present Illness: Katherine Delgado is a 77 y.o. female with history of seropositive rheumatoid arthritis, osteoarthritis, Sjogren's syndrome, fibromyalgia, and DDD.  At the patient's last visit she was advised to restart on methotrexate but decided that she did not want to due to side effects of increased joint pain, nausea, and diarrhea.  She would like to restart on Xeljanz 5 mg by mouth twice daily.  She reports that she fell in August on her right knee.  She states that it is difficult to bend her knee and bear full weight.  She is been wearing a wheelchair due to not wanting to bear weight.  She would like to x-ray the right knee joint.  She continues to have chronic lower back pain.  She states that she is has had surgery on her lower back.  She had an MRI on 01/10/2018.  She had a cortisone injection performed by [facility] imaging yesterday.  She reports that she has intermittent pain and swelling in bilateral hands and wrists.  Activities of Daily Living:  Patient reports morning stiffness for 30  minutes.   Patient Reports nocturnal pain.  Difficulty dressing/grooming: Denies Difficulty climbing stairs: Reports Difficulty getting out of chair: Reports Difficulty using hands for taps, buttons, cutlery, and/or writing: Reports  Review of Systems  Constitutional: Positive for fatigue.  HENT: Positive for mouth dryness. Negative for mouth sores and nose dryness.   Eyes: Positive for dryness. Negative for pain and visual disturbance.  Respiratory: Negative for cough, hemoptysis, shortness of breath and difficulty breathing.   Cardiovascular: Positive for swelling in legs/feet. Negative for chest pain, palpitations and  hypertension.  Gastrointestinal: Positive for constipation and diarrhea. Negative for blood in stool.  Endocrine: Negative for increased urination.  Genitourinary: Negative for painful urination.  Musculoskeletal: Positive for arthralgias, joint pain, joint swelling and morning stiffness. Negative for myalgias, muscle weakness, muscle tenderness and myalgias.  Skin: Positive for rash (Cipro). Negative for color change, pallor, hair loss, nodules/bumps, skin tightness, ulcers and sensitivity to sunlight.  Allergic/Immunologic: Negative for susceptible to infections.  Neurological: Negative for dizziness, numbness, headaches and weakness.  Hematological: Negative for swollen glands.  Psychiatric/Behavioral: Positive for sleep disturbance. Negative for depressed mood. The patient is not nervous/anxious.     PMFS History:  Patient Active Problem List   Diagnosis Date Noted  . Lumbar spinal stenosis 03/30/2018  . Ulcer of toe of left foot, with necrosis of bone ([place]) 07/25/2016  . Rheumatoid arthritis with positive rheumatoid factor ([place]) 06/14/2016  . High risk medication use 06/14/2016  . Sjogren's syndrome ([place]) 06/14/2016  . Primary osteoarthritis of both knees 06/14/2016  . DDD (degenerative disc disease), lumbar 06/14/2016  . Hypothyroidism 06/14/2016  . Osteoporosis 06/14/2016  . Abnormality of gait 04/19/2016  . Type 2 diabetes mellitus with diabetic polyneuropathy, with long-term current use of insulin ([place]) 11/27/2015  . Severe obesity (BMI >= 40) ([place]) 04/21/2014  . Type II or unspecified type diabetes mellitus with peripheral circulatory disorders, uncontrolled(250.72) 09/12/2013  . Headache(784.0) 07/31/2013  . Sinus infection 07/31/2013  . Chronic low back  pain 03/14/2013  . Insomnia 12/06/2012  . Nausea alone 12/06/2012  . Diarrhea 12/06/2012  . Urinary incontinence, urge 12/06/2012  . Lumbosacral root lesions, not elsewhere classified 11/27/2012  . Diabetic  polyneuropathy associated with type 2 diabetes mellitus ([place]) 11/27/2012  . EDEMA 05/04/2010  . YEAST INFECTION 05/03/2010  . Hyperlipidemia 05/03/2010  . DEPRESSION 05/03/2010  . History of peripheral neuropathy 05/03/2010  . GUILLAIN-BARRE SYNDROME 05/03/2010  . Essential hypertension 05/03/2010  . ALLERGIC RHINITIS 05/03/2010  . PNEUMONIA 05/03/2010  . COPD (chronic obstructive pulmonary disease) ([place]) 05/03/2010  . GERD 05/03/2010  . Fibromyalgia 05/03/2010  . DYSPNEA 05/03/2010  . CHEST PAIN 05/03/2010    Past Medical History:  Diagnosis Date  . Abnormal weight gain   . Abnormality of gait 04/19/2016  . Acute infective polyneuritis ([place]) 2002  . Acute maxillary sinusitis   . Acute sinusitis, unspecified   . Allergic rhinitis due to pollen   . Arthritis   . Back injury   . Candidiasis of skin and nails   . Candidiasis of vulva and vagina   . Chronic pain syndrome   . COPD (chronic obstructive pulmonary disease) ([place])   . Cough   . Degenerative arthritis   . Depressive disorder, not elsewhere classified   . Diabetes mellitus without complication ([place])   . Diaphragmatic hernia without mention of obstruction or gangrene   . Dyslipidemia   . Edema   . Fibromyalgia   . GERD (gastroesophageal reflux disease)   . Guillain-Barre syndrome ([place])   . History of benign thymus tumor   . Hypertension   . Insomnia, unspecified   . Lumbago   . Lumbar spinal stenosis 03/30/2018   L4-5 level, severe  . Miscarriage 1962  . Mixed hyperlipidemia   . Morbid obesity ([place])   . Obstructive chronic bronchitis with acute bronchitis ([place])   . Osteopenia, senile   . Osteoporosis   . Other malaise and fatigue   . Other specified disease of white blood cells   . Pain in joint, multiple sites   . Polyneuropathy in diabetes(357.2)   . RA (rheumatoid arthritis) ([place])   . Reflux esophagitis   . Rheumatoid arthritis(714.0)   . Shortness of breath   . Spinal stenosis, lumbar region, without  neurogenic claudication   . Spondylosis, lumbosacral   . Spontaneous ecchymoses   . Stiffness of joints, not elsewhere classified, multiple sites   . Tear film insufficiency, unspecified   . Thyroid disease   . Thyroid disorder   . Type II or unspecified type diabetes mellitus with peripheral circulatory disorders, uncontrolled(250.72)   . Unspecified chronic bronchitis ([place])   . Unspecified essential hypertension   . Unspecified hypothyroidism   . Unspecified pruritic disorder   . Unspecified urinary incontinence   . Urinary tract infection, site not specified     Family History  Problem Relation Age of Onset  . Alzheimer's disease Mother   . Heart disease Mother   . Heart disease Father   . Liver disease Father   . Cancer Brother   . Arthritis Son   . Colon polyps Brother   . Colon cancer Neg Hx   . Esophageal cancer Neg Hx   . Kidney disease Neg Hx   . Stomach cancer Neg Hx   . Rectal cancer Neg Hx    Past Surgical History:  Procedure Laterality Date  . ABDOMINAL HYSTERECTOMY  1974  . abdominal tumor  2002  . APPENDECTOMY    . BACK SURGERY  [place]  2001  . CHOLECYSTECTOMY  1984  . KNEE SURGERY Bilateral 08/27/2010 (L) and 01/11/2011 (R)  . [place]  . OVARIAN CYST SURGERY  1968  . thymus tumor    . 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 TONSILLECTOMY     Social History   Social History Narrative   Walks with cane   Right handed    Caffeine use: Coffee (2 cups every morning)   Tea: sometimes   Soda: none    Objective: Vital Signs: BP 116/64 (BP Location: Right Arm, Patient Position: Sitting, Cuff Size: Large)   Pulse 80   Resp 17   Ht 5\' 6"  (1.676 m)   Wt 259 lb (117.5 kg)   BMI 41.80 kg/m    Physical Exam  Constitutional: She is oriented to person, place, and time. She appears well-developed and well-nourished.  HENT:  Head: Normocephalic and atraumatic.  Eyes: Conjunctivae and EOM are normal.  Neck: Normal range of motion.    Cardiovascular: Normal rate, regular rhythm, normal heart sounds and intact distal pulses.  Pulmonary/Chest: Effort normal and breath sounds normal.  Abdominal: Soft. Bowel sounds are normal.  Lymphadenopathy:    She has no cervical adenopathy.  Neurological: She is alert and oriented to person, place, and time.  Skin: Skin is warm and dry. Capillary refill takes less than 2 seconds.  Ulcer of left great toe is healed. Callus present.   Psychiatric: She has a normal mood and affect. Her behavior is normal.  Nursing note and vitals reviewed.    Musculoskeletal Exam: C-spine slightly limited range of motion with lateral rotation.  Thoracic kyphosis noted.  No midline spinal tenderness.  No SI joint tenderness.  She is in a wheelchair.  Trapezius muscle tension and muscle tenderness.  Right shoulder full range of motion.  Left shoulder limited to 120 degrees abduction.  Elbow joints good ROM with no tenderness or synovitis.  Post-surgical change in left wrist joint.  She is complete fist formation bilaterally.  PIP and DIP synovial thickening consistent with osteoarthritis of bilateral hands.  She has bilateral CMC joint synovial thickening.  Hip range of motion is difficult to assess while in wheelchair.  Left knee full range of motion no discomfort.  No warmth or effusion noted of left knee.  Right knee discomfort with range of motion.  She has warmth of her right knee on exam.  Pedal edema bilaterally. CDAI Exam: CDAI Score: 1.2  Patient Global Assessment: 6 (mm); Provider Global Assessment: 6 (mm) Swollen: 0 ; Tender: 0  Joint Exam   Not documented   There is currently no information documented on the homunculus. Go to the Rheumatology activity and complete the homunculus joint exam.  Investigation: No additional findings.  Imaging: Dg Inject Diag/thera/inc Needle/cath/plc Epi/lumb/sac W/img  Result Date: 05/23/2018 CLINICAL DATA:  Lumbosacral spondylosis without myelopathy. RIGHT leg  radicular symptoms. Stenosis at L3-4 and L4-5. FLUOROSCOPY TIME:  25 seconds corresponding to a Dose Area Product of 75.73 Gy*m2 PROCEDURE: The procedure, risks, benefits, and alternatives were explained to the patient. Questions regarding the procedure were encouraged and answered. The patient understands and consents to the procedure. LUMBAR EPIDURAL INJECTION: An interlaminar approach was performed on RIGHT at L3-L4. The overlying skin was cleansed and anesthetized. A 20 gauge epidural needle was advanced using loss-of-resistance technique. DIAGNOSTIC EPIDURAL INJECTION: Injection of Isovue-M 200 shows a good epidural pattern with spread above and below the level of needle placement, primarily on the RIGHT;  no vascular opacification is seen. THERAPEUTIC EPIDURAL INJECTION: 120.0 mg of Depo-Medrol mixed with 2 mL 1% lidocaine were instilled. The procedure was well-tolerated, and the patient was discharged thirty minutes following the injection in good condition. COMPLICATIONS: None. IMPRESSION: Technically successful epidural injection on the RIGHT L3-L4 # 2. Electronically Signed   By: Staci Righter M.D.   On: 05/23/2018 11:57    Recent Labs: Lab Results  Component Value Date   WBC 14.5 (H) 04/06/2018   HGB 13.2 04/06/2018   PLT 346 04/06/2018   NA 141 04/06/2018   K 4.8 04/06/2018   CL 103 04/06/2018   CO2 31 04/06/2018   GLUCOSE 102 (H) 04/06/2018   BUN 18 04/06/2018   CREATININE 0.93 04/06/2018   BILITOT 0.4 04/06/2018   ALKPHOS 113 02/20/2017   AST 41 (H) 04/06/2018   ALT 18 04/06/2018   PROT 6.3 04/06/2018   ALBUMIN 3.9 02/20/2017   CALCIUM 9.1 04/06/2018   GFRAA 69 04/06/2018    Speciality Comments: No specialty comments available.  Procedures:  No procedures performed Allergies: Penicillins; Statins; and Sulfamethoxazole-trimethoprim   Assessment / Plan:     Visit Diagnoses: Rheumatoid arthritis involving multiple sites with positive rheumatoid factor (HCC) - +RF, +CCP: She  has no synovitis on exam.  She has not had any recent rheumatoid arthritis flares.  She has warmth of the right knee on exam today.  She did not want to restart on methotrexate due to experiencing nausea, diarrhea, and increased joint pain while on methotrexate.  She would like to resume Xeljanz 5 mg by mouth twice daily.  She was started on ciprofloxacin today for a rash present on the posterior aspect of her back.  She will start on Xeljanz 5 mg 1 tablet by mouth daily once she has completed the prescription for Cipro.  She will take Xeljanz 5 mg 1 tablet by mouth daily for 1 month.  If she does not have any recurrent infections during that time she will increase to 5 mg twice daily.  A prescription for Morrie Sheldon was sent to the pharmacy today.  She was advised to notify us if she develops increased joint pain or joint swelling.  She will follow-up in the office in 3 months.  High risk medication use -Xeljanz 5 mg by mouth twice daily.  She will resume taking Xeljanz 5 mg by mouth 1 tablet daily for 1 month.  If she does not have any recurrent infections during that time she will increase to Xeljanz 5 mg by mouth twice daily.  She does not want to resume methotrexate at this time due to experiencing side effects in the past.  Ulcer of left foot, unspecified ulcer stage ([place]): Healed.  Callus present.  Primary osteoarthritis of both knees: She has chronic pain in both knee joints.  The pain is most severe in her right knee.  She fell in August 2019 and has had constant right knee pain since.  She has warmth and discomfort with range of motion on exam today.  X-ray of the right knee was obtained today.  Chronic pain of right knee -Patient reports that she fell on her right knee in August 2019.  She has warmth and painful range of motion.  She has been in a wheelchair due to having discomfort when bearing weight and with flexion.  X-ray of the right knee was obtained today.  Plan: XR KNEE 3 VIEW RIGHT    Sjogren's syndrome with keratoconjunctivitis sicca ([place]): She continues to  have sicca symptoms.  She has no parotid swelling on exam.  Fibromyalgia: She has generalized muscle aches muscle tenderness due to fibromyalgia.  She has muscle tension and muscle tenderness in the trapezius muscles bilaterally.  She continues to have fatigue and insomnia.  She has pain that wakes her up at night.  Age-related osteoporosis without current pathological fracture:  DDD (degenerative disc disease), lumbar: Chronic pain.  She had an MRI of the lumbar spine on 01/10/2018.  She will be having surgery soon.  She had a cortisone injection performed at [facility] yesterday.  Other insomnia: She has chronic insomnia.  She has very interrupted sleep at night due to the level of pain that she is in.  Other medical conditions are listed as follows:  History of Guillain-Barre syndrome  History of depression  History of hypertension  History of COPD  History of peripheral neuropathy  History of diabetes mellitus  History of gastroesophageal reflux (GERD)  Vitamin D deficiency   Orders: Orders Placed This Encounter  Procedures  . XR KNEE 3 VIEW RIGHT   No orders of the defined types were placed in this encounter.   Face-to-face time spent with patient was 30 minutes. Greater than 50% of time was spent in counseling and coordination of care.  Follow-Up Instructions: Return for Rheumatoid arthritis, Osteoarthritis, Fibromyalgia, Sjogren's syndrome.   Ofilia Neas, PA-C  Note - This record has been created using Dragon software.  Chart creation errors have been sought, but may not always  have been located. Such creation errors do not reflect on  the standard of medical care.

## 2018-05-23 ENCOUNTER — Other Ambulatory Visit: Payer: Self-pay | Admitting: *Deleted

## 2018-05-23 ENCOUNTER — Ambulatory Visit
Admission: RE | Admit: 2018-05-23 | Discharge: 2018-05-23 | Disposition: A | Discharge status: Home / Self Care | Payer: Medicare Other | Source: Ambulatory Visit | Attending: Neurology | Admitting: Neurology

## 2018-05-23 ENCOUNTER — Ambulatory Visit: Payer: Medicare Other | Admitting: Physician Assistant

## 2018-05-23 DIAGNOSIS — G8929 Other chronic pain: Secondary | ICD-10-CM

## 2018-05-23 DIAGNOSIS — M48062 Spinal stenosis, lumbar region with neurogenic claudication: Secondary | ICD-10-CM

## 2018-05-23 DIAGNOSIS — M48061 Spinal stenosis, lumbar region without neurogenic claudication: Secondary | ICD-10-CM | POA: Diagnosis not present

## 2018-05-23 DIAGNOSIS — M545 Low back pain: Principal | ICD-10-CM

## 2018-05-23 MED ORDER — HYDROCODONE-ACETAMINOPHEN 10-325 MG PO TABS: 1.0000 | ORAL_TABLET | ORAL | 0 refills | Status: DC | PRN

## 2018-05-23 MED ORDER — IOPAMIDOL (ISOVUE-M 200) INJECTION 41%
1.0000 mL | Freq: Once | INTRAMUSCULAR | Status: AC
  Administered 2018-05-23: 1 mL via EPIDURAL

## 2018-05-23 MED ORDER — METHYLPREDNISOLONE ACETATE 40 MG/ML INJ SUSP (RADIOLOG
120.0000 mg | Freq: Once | INTRAMUSCULAR | Status: AC
  Administered 2018-05-23: 120 mg via EPIDURAL

## 2018-05-23 NOTE — Telephone Encounter (Signed)
Patient requested refill NCCSRS Database Verified LR: 04/24/2018 Pharmacy Confirmed Pended Rx and sent to Dr. Mariea Clonts for approval.

## 2018-05-23 NOTE — Discharge Instructions (Signed)

## 2018-05-24 ENCOUNTER — Ambulatory Visit (INDEPENDENT_AMBULATORY_CARE_PROVIDER_SITE_OTHER): Payer: Medicare Other | Admitting: Physician Assistant

## 2018-05-24 ENCOUNTER — Encounter: Payer: Self-pay | Admitting: Physician Assistant

## 2018-05-24 ENCOUNTER — Ambulatory Visit (INDEPENDENT_AMBULATORY_CARE_PROVIDER_SITE_OTHER): Payer: Medicare Other

## 2018-05-24 ENCOUNTER — Encounter: Payer: Self-pay | Admitting: Internal Medicine

## 2018-05-24 ENCOUNTER — Ambulatory Visit (INDEPENDENT_AMBULATORY_CARE_PROVIDER_SITE_OTHER): Payer: Medicare Other | Admitting: Internal Medicine

## 2018-05-24 VITALS — BP 110/60 | HR 68 | Temp 98.7°F | Ht 66.0 in | Wt 258.0 lb

## 2018-05-24 VITALS — BP 116/64 | HR 80 | Resp 17 | Ht 66.0 in | Wt 259.0 lb

## 2018-05-24 DIAGNOSIS — M25561 Pain in right knee: Secondary | ICD-10-CM

## 2018-05-24 DIAGNOSIS — Z79899 Other long term (current) drug therapy: Secondary | ICD-10-CM | POA: Diagnosis not present

## 2018-05-24 DIAGNOSIS — M170 Bilateral primary osteoarthritis of knee: Secondary | ICD-10-CM

## 2018-05-24 DIAGNOSIS — Z8679 Personal history of other diseases of the circulatory system: Secondary | ICD-10-CM | POA: Diagnosis not present

## 2018-05-24 DIAGNOSIS — G4709 Other insomnia: Secondary | ICD-10-CM | POA: Diagnosis not present

## 2018-05-24 DIAGNOSIS — M3501 Sicca syndrome with keratoconjunctivitis: Secondary | ICD-10-CM

## 2018-05-24 DIAGNOSIS — R300 Dysuria: Secondary | ICD-10-CM | POA: Diagnosis not present

## 2018-05-24 DIAGNOSIS — N3000 Acute cystitis without hematuria: Secondary | ICD-10-CM | POA: Diagnosis not present

## 2018-05-24 DIAGNOSIS — M5136 Other intervertebral disc degeneration, lumbar region: Secondary | ICD-10-CM | POA: Diagnosis not present

## 2018-05-24 DIAGNOSIS — Z8669 Personal history of other diseases of the nervous system and sense organs: Secondary | ICD-10-CM

## 2018-05-24 DIAGNOSIS — L97529 Non-pressure chronic ulcer of other part of left foot with unspecified severity: Secondary | ICD-10-CM | POA: Diagnosis not present

## 2018-05-24 DIAGNOSIS — G8929 Other chronic pain: Secondary | ICD-10-CM

## 2018-05-24 DIAGNOSIS — E559 Vitamin D deficiency, unspecified: Secondary | ICD-10-CM

## 2018-05-24 DIAGNOSIS — M797 Fibromyalgia: Secondary | ICD-10-CM | POA: Diagnosis not present

## 2018-05-24 DIAGNOSIS — Z8709 Personal history of other diseases of the respiratory system: Secondary | ICD-10-CM

## 2018-05-24 DIAGNOSIS — L820 Inflamed seborrheic keratosis: Secondary | ICD-10-CM | POA: Diagnosis not present

## 2018-05-24 DIAGNOSIS — M0579 Rheumatoid arthritis with rheumatoid factor of multiple sites without organ or systems involvement: Secondary | ICD-10-CM

## 2018-05-24 DIAGNOSIS — M810 Age-related osteoporosis without current pathological fracture: Secondary | ICD-10-CM

## 2018-05-24 DIAGNOSIS — M5441 Lumbago with sciatica, right side: Secondary | ICD-10-CM

## 2018-05-24 DIAGNOSIS — Z8659 Personal history of other mental and behavioral disorders: Secondary | ICD-10-CM | POA: Diagnosis not present

## 2018-05-24 DIAGNOSIS — Z8719 Personal history of other diseases of the digestive system: Secondary | ICD-10-CM

## 2018-05-24 DIAGNOSIS — B372 Candidiasis of skin and nail: Secondary | ICD-10-CM

## 2018-05-24 DIAGNOSIS — Z8639 Personal history of other endocrine, nutritional and metabolic disease: Secondary | ICD-10-CM

## 2018-05-24 LAB — POCT URINALYSIS DIPSTICK
Bilirubin, UA: NEGATIVE
Glucose, UA: NEGATIVE
Ketones, UA: NEGATIVE
Nitrite, UA: POSITIVE
Protein, UA: NEGATIVE
Spec Grav, UA: 1.015 (ref 1.010–1.025)
Urobilinogen, UA: NEGATIVE E.U./dL — AB
pH, UA: 6 (ref 5.0–8.0)

## 2018-05-24 MED ORDER — TOFACITINIB CITRATE 5 MG PO TABS: 5.0000 mg | ORAL_TABLET | Freq: Two times a day (BID) | ORAL | 0 refills | Status: DC

## 2018-05-24 MED ORDER — CIPROFLOXACIN HCL 500 MG PO TABS: 500.0000 mg | ORAL_TABLET | Freq: Two times a day (BID) | ORAL | 0 refills | Status: DC

## 2018-05-24 MED ORDER — NYSTATIN 100000 UNIT/GM EX CREA: 1.0000 "application " | TOPICAL_CREAM | Freq: Two times a day (BID) | CUTANEOUS | 3 refills | Status: DC

## 2018-05-24 NOTE — Patient Instructions (Addendum)

## 2018-05-24 NOTE — Progress Notes (Signed)
Location:  [facility] clinic Provider: Kristeen Lantz L. Mariea Clonts, D.O., C.M.D.  Code Status: DNR Goals of Care:  Advanced Directives 04/05/2018  Does Patient Have a Medical Advance Directive? Yes  Type of Advance Directive Out of facility DNR (pink MOST or yellow form)  Does patient want to make changes to medical advance directive? No - Patient declined  Copy of [place] in Chart? -  Would patient like information on creating a medical advance directive? -  Pre-existing out of facility DNR order (yellow form or pink MOST form) Pink MOST form placed in chart (order not valid for inpatient use)     Chief Complaint  Patient presents with  . Acute Visit    UTI burning with urination, rash on back    HPI: Patient is a 77 y.o. female with h/o DMII, CHF, COPD, osteoarthritis seen today for an acute visit for concerns that she may have a UTI, rash on her back.    Having burning with urination.  Has been incontinent for a while since her back was worse.  Has small volumes each time.  It's been milky, cloudy.  No chills or fever.  Some lower abdominal pain that she's not sure if it's coming around from her back.   She has a rash on her back.  Notes she has bumps on her back anyway.  Was worried she had MRSA.    Her back pain has been so much worse in the last week and a half and radiates around her lower abdomen, groin and foot.  Had a cortisone injection in her back yesterday.  It has helped some.  Has a wedding to go to Saturday. She's prefer to walk not roll.  She is planning to reconsult with neurosurgery for a back surgery in the future.    Does not take flu shot due to prior Guillain-Barre.   Past Medical History:  Diagnosis Date  . Abnormal weight gain   . Abnormality of gait 04/19/2016  . Acute infective polyneuritis ([place]) 2002  . Acute maxillary sinusitis   . Acute sinusitis, unspecified   . Allergic rhinitis due to pollen   . Arthritis   . Back injury   . Candidiasis of  skin and nails   . Candidiasis of vulva and vagina   . Chronic pain syndrome   . COPD (chronic obstructive pulmonary disease) ([place])   . Cough   . Degenerative arthritis   . Depressive disorder, not elsewhere classified   . Diabetes mellitus without complication ([place])   . Diaphragmatic hernia without mention of obstruction or gangrene   . Dyslipidemia   . Edema   . Fibromyalgia   . GERD (gastroesophageal reflux disease)   . Guillain-Barre syndrome ([place])   . History of benign thymus tumor   . Hypertension   . Insomnia, unspecified   . Lumbago   . Lumbar spinal stenosis 03/30/2018   L4-5 level, severe  . Miscarriage 1962  . Mixed hyperlipidemia   . Morbid obesity ([place])   . Obstructive chronic bronchitis with acute bronchitis ([place])   . Osteopenia, senile   . Osteoporosis   . Other malaise and fatigue   . Other specified disease of white blood cells   . Pain in joint, multiple sites   . Polyneuropathy in diabetes(357.2)   . RA (rheumatoid arthritis) ([place])   . Reflux esophagitis   . Rheumatoid arthritis(714.0)   . Shortness of breath   . Spinal stenosis, lumbar region, without neurogenic claudication   .  Spondylosis, lumbosacral   . Spontaneous ecchymoses   . Stiffness of joints, not elsewhere classified, multiple sites   . Tear film insufficiency, unspecified   . Thyroid disease   . Thyroid disorder   . Type II or unspecified type diabetes mellitus with peripheral circulatory disorders, uncontrolled(250.72)   . Unspecified chronic bronchitis ([place])   . Unspecified essential hypertension   . Unspecified hypothyroidism   . Unspecified pruritic disorder   . Unspecified urinary incontinence   . Urinary tract infection, site not specified     Past Surgical History:  Procedure Laterality Date  . ABDOMINAL HYSTERECTOMY  1974  . abdominal tumor  2002  . APPENDECTOMY    . [place]  . BRONCHOSCOPY  2001  . CHOLECYSTECTOMY  1984  . KNEE SURGERY Bilateral 08/27/2010  (L) and 01/11/2011 (R)  . [place]  . OVARIAN CYST SURGERY  1968  . thymus tumor    . 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 TONSILLECTOMY      Allergies  Allergen Reactions  . Penicillins Hives, Itching, Swelling and Rash  . Statins Other (See Comments)    Myopathy, transaminitis  . Sulfamethoxazole-Trimethoprim Itching    Outpatient Encounter Medications as of 05/24/2018  Medication Sig  . albuterol (PROVENTIL HFA;VENTOLIN HFA) 108 (90 Base) MCG/ACT inhaler Inhale 2 puffs into the lungs every 6 (six) hours as needed for wheezing or shortness of breath.  Marland Kitchen alendronate (FOSAMAX) 70 MG tablet Take 1 tablet (70 mg total) by mouth once a week. Take with a full glass of water on an empty stomach.  Marland Kitchen amLODipine (NORVASC) 10 MG tablet Take 1 tablet (10 mg total) by mouth daily. Please schedule appointment for refills.  Marland Kitchen aspirin EC 81 MG tablet Take 81 mg by mouth daily as needed.   . B-D UF III MINI PEN NEEDLES 31G X 5 MM MISC USE AS DIRECTED  . cadexomer iodine (IODOSORB) 0.9 % gel Apply 1 application topically daily as needed for wound care.  . folic acid (FOLVITE) 1 MG tablet Take 2 tablets (2 mg total) by mouth daily.  . furosemide (LASIX) 20 MG tablet TAKE 1 TABLET BY MOUTH TWICE DAILY AS NEEDED FOR 3 POUND WEIGHT GAIN IN ONE DAY OR 5 POUNDS IN ONE WEEK  . gabapentin (NEURONTIN) 600 MG tablet TAKE 1 TABLET BY MOUTH THREE TIMES DAILY  . glucose blood (ACCU-CHEK SMARTVIEW) test strip Check blood sugar 3-4 times daily  . HUMALOG KWIKPEN 200 UNIT/ML SOPN INJECT 20 UNITS UNDER THE SKIN THREE TIMES DAILY BEFORE A MEAL  . HYDROcodone-acetaminophen (NORCO) 10-325 MG tablet Take 1 tablet by mouth every 3 (three) hours as needed for moderate pain.  Marland Kitchen levothyroxine (SYNTHROID, LEVOTHROID) 137 MCG tablet TAKE 1 TABLET BY MOUTH DAILY BEFORE BREAKFAST ON AN EMPTY STOMACH( LABS OVERDUE)  . lisinopril (PRINIVIL,ZESTRIL) 20 MG tablet Take 1 tablet (20 mg total) by mouth daily. Please schedule appointment for  refills.  Marland Kitchen lisinopril (PRINIVIL,ZESTRIL) 20 MG tablet Take 1 tablet (20 mg total) by mouth daily. Please schedule appointment for refills.  Marland Kitchen loperamide (IMODIUM A-D) 2 MG tablet Take 2 mg by mouth 4 (four) times daily as needed for diarrhea or loose stools.  . magnesium oxide (MAG-OX) 400 MG tablet Take 400 mg by mouth daily as needed.  . Melatonin 10 MG CAPS Take 1 capsule by mouth at bedtime  . metoprolol tartrate (LOPRESSOR) 50 MG tablet Take 50 mg by mouth 2 (two) times daily.   Marland Kitchen MYRBETRIQ 50  MG TB24 tablet TAKE 1 TABLET(50 MG) BY MOUTH DAILY  . nystatin (NYSTATIN) powder Apply topically daily as needed.  Marland Kitchen omega-3 acid ethyl esters (LOVAZA) 1 g capsule TAKE ONE CAPSULE BY MOUTH EVERY DAY  . oxyCODONE (OXY IR/ROXICODONE) 5 MG immediate release tablet Take one tablet every 6 hours as needed for severe pain, Take 2 tablets at bedtime.  . polyvinyl alcohol (LIQUIFILM TEARS) 1.4 % ophthalmic solution Place 1 drop into both eyes as needed (for dry eyes).   . potassium chloride (MICRO-K) 10 MEQ CR capsule Take 2 capsules by mouth 2 (two) times daily. While on lasix  . predniSONE (DELTASONE) 5 MG tablet Take 1 tablet (5 mg total) by mouth daily with breakfast.  . pregabalin (LYRICA) 150 MG capsule Take 1 capsule (150 mg total) by mouth 2 (two) times daily.  Marland Kitchen tiZANidine (ZANAFLEX) 2 MG tablet Take 1 tablet (2 mg total) by mouth 3 (three) times daily.  . TOUJEO SOLOSTAR 300 UNIT/ML SOPN INJECT 100 UNITS UNDER THE SKIN EVERY NIGHT AT BEDTIME  . triamterene-hydrochlorothiazide (MAXZIDE-25) 37.5-25 MG tablet TAKE 1 TABLET BY MOUTH DAILY  . umeclidinium-vilanterol (ANORO ELLIPTA) 62.5-25 MCG/INH AEPB Inhale 1 puff into the lungs daily.  Marland Kitchen venlafaxine XR (EFFEXOR-XR) 75 MG 24 hr capsule TAKE 1 CAPSULE(75 MG) BY MOUTH DAILY WITH BREAKFAST  . VICTOZA 18 MG/3ML SOPN ADMINISTER 1.8 MG UNDER THE SKIN DAILY FOR BLOOD SUGAR   No facility-administered encounter medications on file as of 05/24/2018.      Review of Systems:  Review of Systems  Constitutional: Negative for chills and fever.  HENT: Negative for congestion.   Eyes: Negative for blurred vision.  Respiratory: Negative for cough and shortness of breath.   Cardiovascular: Negative for chest pain and palpitations.  Gastrointestinal: Positive for abdominal pain.  Genitourinary: Positive for dysuria, frequency and urgency. Negative for flank pain and hematuria.  Musculoskeletal: Positive for back pain. Negative for falls and joint pain.  Skin: Positive for itching and rash.       Seborrheic keratoses  Neurological: Negative for dizziness.  Endo/Heme/Allergies: Does not bruise/bleed easily.  Psychiatric/Behavioral: Negative for memory loss. The patient is not nervous/anxious and does not have insomnia.     Health Maintenance  Topic Date Due  . OPHTHALMOLOGY EXAM  12/20/1950  . FOOT EXAM  09/27/2016  . INFLUENZA VACCINE  08/22/2019 (Originally 03/22/2018)  . HEMOGLOBIN A1C  10/07/2018  . DEXA SCAN  09/20/2025  . TETANUS/TDAP  05/28/2026  . PNA vac Low Risk Adult  Completed    Physical Exam: Vitals:   05/24/18 1011  BP: 110/60  Pulse: 68  Temp: 98.7 F (37.1 C)  TempSrc: Oral  SpO2: 96%  Weight: 258 lb (117 kg)  Height: 5\' 6"  (1.676 m)   Body mass index is 41.64 kg/m. Physical Exam  Constitutional: She is oriented to person, place, and time. She appears well-developed and well-nourished.  HENT:  Head: Normocephalic and atraumatic.  Cardiovascular: Normal rate, regular rhythm, normal heart sounds and intact distal pulses.  Pulmonary/Chest: Effort normal and breath sounds normal. She has no wheezes.  Abdominal: Bowel sounds are normal.  Musculoskeletal: Normal range of motion.  Neurological: She is alert and oriented to person, place, and time.  Skin: Skin is warm and dry.  Multiple excoriated seborrheic keratoses of back  Psychiatric: She has a normal mood and affect.    Labs reviewed: Basic Metabolic  Panel: Recent Labs    12/27/17 1352 01/22/18 0941 04/06/18 0930  NA 141 139  141  K 4.5 4.6 4.8  CL 101 101 103  CO2 31 30 31   GLUCOSE 98 198* 102*  BUN 17 14 18   CREATININE 0.90 0.97* 0.93  CALCIUM 9.6 9.7 9.1  TSH  --   --  0.73   Liver Function Tests: Recent Labs    12/27/17 1352 01/22/18 0941 04/06/18 0930  AST 42* 26 41*  ALT 23 14 18   BILITOT 0.3 0.5 0.4  PROT 6.9 7.0 6.3   No results for input(s): LIPASE, AMYLASE in the last 8760 hours. No results for input(s): AMMONIA in the last 8760 hours. CBC: Recent Labs    01/22/18 0941 03/06/18 0941 04/06/18 0930  WBC 13.3* 16.6* 14.5*  NEUTROABS 9,323* 12,915* 10,701*  HGB 14.5 13.7 13.2  HCT 43.9 42.1 39.7  MCV 84.1 85.1 83.8  PLT 305 307 346   Lipid Panel: Recent Labs    04/06/18 0930  CHOL 129  HDL 29*  LDLCALC 75  TRIG 145  CHOLHDL 4.4   Lab Results  Component Value Date   HGBA1C 9.3 (H) 04/06/2018    Procedures since last visit: Dg Inject Diag/thera/inc Needle/cath/plc Epi/lumb/sac W/img  Result Date: 05/23/2018 CLINICAL DATA:  Lumbosacral spondylosis without myelopathy. RIGHT leg radicular symptoms. Stenosis at L3-4 and L4-5. FLUOROSCOPY TIME:  25 seconds corresponding to a Dose Area Product of 75.73 Gy*m2 PROCEDURE: The procedure, risks, benefits, and alternatives were explained to the patient. Questions regarding the procedure were encouraged and answered. The patient understands and consents to the procedure. LUMBAR EPIDURAL INJECTION: An interlaminar approach was performed on RIGHT at L3-L4. The overlying skin was cleansed and anesthetized. A 20 gauge epidural needle was advanced using loss-of-resistance technique. DIAGNOSTIC EPIDURAL INJECTION: Injection of Isovue-M 200 shows a good epidural pattern with spread above and below the level of needle placement, primarily on the RIGHT; no vascular opacification is seen. THERAPEUTIC EPIDURAL INJECTION: 120.0 mg of Depo-Medrol mixed with 2 mL 1% lidocaine  were instilled. The procedure was well-tolerated, and the patient was discharged thirty minutes following the injection in good condition. COMPLICATIONS: None. IMPRESSION: Technically successful epidural injection on the RIGHT L3-L4 # 2. Electronically Signed   By: Staci Righter M.D.   On: 05/23/2018 11:57    Assessment/Plan 1. Burning with urination - suspect UTI based on positive dipstick and symptoms - POC Urinalysis Dipstick - Urine Culture - ciprofloxacin (CIPRO) 500 MG tablet; Take 1 tablet (500 mg total) by mouth 2 (two) times daily.  Dispense: 14 tablet; Refill: 0  2. Acute cystitis without hematuria -will tx pending culture due to wedding she has this weekend and need to improve symptoms asap - ciprofloxacin (CIPRO) 500 MG tablet; Take 1 tablet (500 mg total) by mouth 2 (two) times daily.  Dispense: 14 tablet; Refill: 0  3. Seborrheic keratoses, inflamed -educated on what these are and that they are not an infection that can be resolved with treatment--if bothersome, derm can removed them  4. Chronic right-sided low back pain with right-sided sciatica -s/p epidural injection--does not some improvement in symptoms, cont same opioid regimen--was requesting increase, but just got her shot yesterday and plans on future surgery--discussed risks of surgery given her diabetes and difficulty with healing, prior toe wound  5. Candidal skin infection - cont powder or cream as needed for this in skin folds - nystatin cream (MYCOSTATIN); Apply 1 application topically 2 (two) times daily. To skin folds  Dispense: 30 g; Refill: 3  Labs/tests ordered:   Orders Placed This Encounter  Procedures  . Urine Culture  . POC Urinalysis Dipstick   Next appt:  08/09/2018  Alvita Fana L. Machi Whittaker, D.O. [facility] [address], [place] [address] Cell Phone (Mon-Fri 8am-5pm):  [phone] On Call:  [phone] & follow prompts after 5pm &  weekends Office Phone:  [phone] Office Fax:  [phone]

## 2018-05-26 LAB — URINE CULTURE
MICRO NUMBER:: 91191187
SPECIMEN QUALITY:: ADEQUATE

## 2018-05-31 ENCOUNTER — Other Ambulatory Visit: Payer: Self-pay

## 2018-05-31 ENCOUNTER — Other Ambulatory Visit: Payer: Self-pay | Admitting: Internal Medicine

## 2018-05-31 DIAGNOSIS — N3281 Overactive bladder: Secondary | ICD-10-CM

## 2018-05-31 DIAGNOSIS — M797 Fibromyalgia: Secondary | ICD-10-CM

## 2018-05-31 DIAGNOSIS — R5382 Chronic fatigue, unspecified: Principal | ICD-10-CM

## 2018-05-31 DIAGNOSIS — G9332 Myalgic encephalomyelitis/chronic fatigue syndrome: Secondary | ICD-10-CM

## 2018-05-31 MED ORDER — OXYCODONE HCL 5 MG PO TABS: ORAL_TABLET | ORAL | 0 refills | Status: DC

## 2018-05-31 NOTE — Telephone Encounter (Signed)
RX request for Oxycodone, last filled 05/03/18  [place] Database verified and compliance confirmed

## 2018-06-01 ENCOUNTER — Other Ambulatory Visit: Payer: Medicare Other

## 2018-06-01 ENCOUNTER — Ambulatory Visit: Payer: Medicare Other | Admitting: Rheumatology

## 2018-06-01 NOTE — Telephone Encounter (Signed)
Left message informing patient rx called in

## 2018-06-11 ENCOUNTER — Telehealth: Payer: Self-pay | Admitting: Neurology

## 2018-06-11 ENCOUNTER — Telehealth: Payer: Self-pay

## 2018-06-11 NOTE — Telephone Encounter (Signed)
Katherine Delgado called today and left message for Dr. Mariea Clonts. Patient states that she is having severe back pain and can not walk. She is requesting something stronger for pain until she can go for surgery. She would like Dr. Mariea Clonts or nurse to call her back.

## 2018-06-11 NOTE — Telephone Encounter (Signed)
Pt has called stating that her pain has worsen, pt states she is unable to walk or stand.  Pt states that she has a call into Dr Orest Dikes manages her pain medication) but she'd like to know what Dr Jannifer Franklin would suggest or possibly be able to do for her.  Pt asking for a call

## 2018-06-11 NOTE — Telephone Encounter (Signed)
Sounds like her back injection did not work. I recommend she call her doctor back that performed the injection.  She should continue her hydrocdone and oxycodone she already has prescribed at this point.

## 2018-06-11 NOTE — Telephone Encounter (Signed)
Spoke with patient and advised results   

## 2018-06-11 NOTE — Telephone Encounter (Signed)
Spoke with Katherine Delgado.  She sts. she has an appt. with NS for eval of spinal stenosis/back pain. Sts. Dr. Mariea Clonts gives her Hydrocodone but she wonders if Dr. Jannifer Franklin can give her something stronger. I explained that since Dr. Mariea Clonts is her pain mx. physician, she must call her for this; she can't get controlled substances from 2 physicians. She verbalized understanding of same/fim

## 2018-06-15 ENCOUNTER — Other Ambulatory Visit: Payer: Self-pay | Admitting: Internal Medicine

## 2018-06-16 ENCOUNTER — Other Ambulatory Visit: Payer: Self-pay | Admitting: Cardiology

## 2018-06-18 NOTE — Telephone Encounter (Signed)
Rx request sent to pharmacy.  

## 2018-06-21 ENCOUNTER — Other Ambulatory Visit: Payer: Self-pay | Admitting: *Deleted

## 2018-06-21 DIAGNOSIS — G8929 Other chronic pain: Secondary | ICD-10-CM

## 2018-06-21 DIAGNOSIS — M545 Low back pain: Principal | ICD-10-CM

## 2018-06-21 DIAGNOSIS — M48062 Spinal stenosis, lumbar region with neurogenic claudication: Secondary | ICD-10-CM | POA: Diagnosis not present

## 2018-06-21 DIAGNOSIS — Z6841 Body Mass Index (BMI) 40.0 and over, adult: Secondary | ICD-10-CM | POA: Diagnosis not present

## 2018-06-21 MED ORDER — HYDROCODONE-ACETAMINOPHEN 10-325 MG PO TABS: 1.0000 | ORAL_TABLET | ORAL | 0 refills | Status: DC | PRN

## 2018-06-21 NOTE — Telephone Encounter (Signed)
Patient requested refill [place] Verified LR: 05/23/2018 Pended Rx and sent to Dr. Mariea Clonts for approval.

## 2018-06-26 ENCOUNTER — Other Ambulatory Visit: Payer: Self-pay | Admitting: Neurological Surgery

## 2018-06-27 ENCOUNTER — Ambulatory Visit: Payer: Medicare Other | Admitting: Neurology

## 2018-06-28 ENCOUNTER — Telehealth: Payer: Self-pay | Admitting: *Deleted

## 2018-06-28 NOTE — Telephone Encounter (Signed)
Patient called and stated that she is going to have back surgery on the 12th and wondering if she can get a Rx for a Lift Chair. Patient stated that she is going to call her insurance to see how much she will have to pay out of pocket. Please Advise.

## 2018-06-29 ENCOUNTER — Encounter (HOSPITAL_COMMUNITY): Payer: Self-pay | Admitting: *Deleted

## 2018-06-29 ENCOUNTER — Other Ambulatory Visit: Payer: Self-pay | Admitting: *Deleted

## 2018-06-29 ENCOUNTER — Other Ambulatory Visit: Payer: Self-pay

## 2018-06-29 DIAGNOSIS — M797 Fibromyalgia: Secondary | ICD-10-CM

## 2018-06-29 DIAGNOSIS — R5382 Chronic fatigue, unspecified: Principal | ICD-10-CM

## 2018-06-29 DIAGNOSIS — G9332 Myalgic encephalomyelitis/chronic fatigue syndrome: Secondary | ICD-10-CM

## 2018-06-29 MED ORDER — OXYCODONE HCL 5 MG PO TABS: ORAL_TABLET | ORAL | 0 refills | Status: DC

## 2018-06-29 NOTE — Telephone Encounter (Signed)
Patient requested  [place] Verified LR: 06/01/2018 Pended Rx and sent to Dr. Mariea Clonts for approval.

## 2018-06-29 NOTE — Telephone Encounter (Signed)
Spoke with patient and she will call medicare and see what they will pay and then call us back if she needs the RX.

## 2018-06-29 NOTE — Progress Notes (Signed)
Spoke with pt for pre-op call. Pt denies cardiac history. Pt is a type 2 diabetic. Last A1C was 9.3 on 04/06/18. Pt states her fasting blood sugars usually range between 125-135. Instructed pt not to take her Monday bedtime dose or Tuesday AM dose of Humalog insulin and to take 1/2 of her regular dose of Toujeo Insulin - she will take 50 units. Instructed her to check her blood sugar Tuesday AM when she gets up and every 2 hours until she leaves for the hospital. If blood sugar is 70 or below, treat with 1/2 cup of clear juice (apple or cranberry) and recheck blood sugar 15 minutes after drinking juice. If blood sugar continues to be 70 or below, call the Short Stay department and ask to speak to a nurse. Pt voiced understanding.

## 2018-06-29 NOTE — Telephone Encounter (Signed)
We can do this.  Looks like lift chair cannot be entered as an Visual merchandiser order.  If there is a way to do this unspecified and print out, it would be good b/c soon we will not be using paper Rxs.  If not possible, we can put it on a paper script and I will sign.  Diagnoses for order:  Rheumatoid arthritis, fibromyalgia, chronic pain right knee, lumbar degenerative disc disease, proximal muscle weakness.  You can also let her know that insurance will typically pay for the motor portion of the chair but not the chair itself.  Another patient just mentioned that [facility] had some for $350 which is a great price for one of these Comptroller.

## 2018-07-02 MED ORDER — VANCOMYCIN HCL 10 G IV SOLR
1500.0000 mg | INTRAVENOUS | Status: AC
  Administered 2018-07-03: 1500 mg via INTRAVENOUS
  Filled 2018-07-02: qty 1500

## 2018-07-03 ENCOUNTER — Other Ambulatory Visit: Payer: Self-pay

## 2018-07-03 ENCOUNTER — Ambulatory Visit (HOSPITAL_COMMUNITY): Payer: Medicare Other | Admitting: Certified Registered Nurse Anesthetist

## 2018-07-03 ENCOUNTER — Encounter (HOSPITAL_COMMUNITY)
Admission: RE | Disposition: A | Discharge status: Skilled Nursing Facility | Payer: Self-pay | Source: Ambulatory Visit | Attending: Neurological Surgery

## 2018-07-03 ENCOUNTER — Encounter (HOSPITAL_COMMUNITY): Payer: Self-pay | Admitting: Certified Registered Nurse Anesthetist

## 2018-07-03 ENCOUNTER — Inpatient Hospital Stay (HOSPITAL_COMMUNITY)
Admission: RE | Admit: 2018-07-03 | Discharge: 2018-07-10 | DRG: 519 | Disposition: A | Discharge status: Skilled Nursing Facility | Payer: Medicare Other | Source: Ambulatory Visit | Attending: Neurological Surgery | Admitting: Neurological Surgery

## 2018-07-03 ENCOUNTER — Ambulatory Visit (HOSPITAL_COMMUNITY): Payer: Medicare Other

## 2018-07-03 DIAGNOSIS — M069 Rheumatoid arthritis, unspecified: Secondary | ICD-10-CM | POA: Diagnosis present

## 2018-07-03 DIAGNOSIS — Z87891 Personal history of nicotine dependence: Secondary | ICD-10-CM | POA: Diagnosis not present

## 2018-07-03 DIAGNOSIS — E782 Mixed hyperlipidemia: Secondary | ICD-10-CM | POA: Diagnosis present

## 2018-07-03 DIAGNOSIS — E039 Hypothyroidism, unspecified: Secondary | ICD-10-CM | POA: Diagnosis present

## 2018-07-03 DIAGNOSIS — Z820 Family history of epilepsy and other diseases of the nervous system: Secondary | ICD-10-CM

## 2018-07-03 DIAGNOSIS — M9973 Connective tissue and disc stenosis of intervertebral foramina of lumbar region: Secondary | ICD-10-CM | POA: Diagnosis not present

## 2018-07-03 DIAGNOSIS — M48062 Spinal stenosis, lumbar region with neurogenic claudication: Principal | ICD-10-CM | POA: Diagnosis present

## 2018-07-03 DIAGNOSIS — M9936 Osseous stenosis of neural canal of lower extremity: Secondary | ICD-10-CM | POA: Diagnosis not present

## 2018-07-03 DIAGNOSIS — Z79890 Hormone replacement therapy (postmenopausal): Secondary | ICD-10-CM

## 2018-07-03 DIAGNOSIS — Z888 Allergy status to other drugs, medicaments and biological substances status: Secondary | ICD-10-CM

## 2018-07-03 DIAGNOSIS — Z8249 Family history of ischemic heart disease and other diseases of the circulatory system: Secondary | ICD-10-CM | POA: Diagnosis not present

## 2018-07-03 DIAGNOSIS — E1151 Type 2 diabetes mellitus with diabetic peripheral angiopathy without gangrene: Secondary | ICD-10-CM | POA: Diagnosis present

## 2018-07-03 DIAGNOSIS — E1142 Type 2 diabetes mellitus with diabetic polyneuropathy: Secondary | ICD-10-CM | POA: Diagnosis not present

## 2018-07-03 DIAGNOSIS — Z6841 Body Mass Index (BMI) 40.0 and over, adult: Secondary | ICD-10-CM | POA: Diagnosis not present

## 2018-07-03 DIAGNOSIS — Z981 Arthrodesis status: Secondary | ICD-10-CM | POA: Diagnosis not present

## 2018-07-03 DIAGNOSIS — Z993 Dependence on wheelchair: Secondary | ICD-10-CM | POA: Diagnosis not present

## 2018-07-03 DIAGNOSIS — Z419 Encounter for procedure for purposes other than remedying health state, unspecified: Secondary | ICD-10-CM

## 2018-07-03 DIAGNOSIS — M0589 Other rheumatoid arthritis with rheumatoid factor of multiple sites: Secondary | ICD-10-CM | POA: Diagnosis not present

## 2018-07-03 DIAGNOSIS — K210 Gastro-esophageal reflux disease with esophagitis: Secondary | ICD-10-CM | POA: Diagnosis present

## 2018-07-03 DIAGNOSIS — J449 Chronic obstructive pulmonary disease, unspecified: Secondary | ICD-10-CM | POA: Diagnosis present

## 2018-07-03 DIAGNOSIS — Z4789 Encounter for other orthopedic aftercare: Secondary | ICD-10-CM | POA: Diagnosis not present

## 2018-07-03 DIAGNOSIS — Z8371 Family history of colonic polyps: Secondary | ICD-10-CM

## 2018-07-03 DIAGNOSIS — M797 Fibromyalgia: Secondary | ICD-10-CM | POA: Diagnosis present

## 2018-07-03 DIAGNOSIS — M810 Age-related osteoporosis without current pathological fracture: Secondary | ICD-10-CM | POA: Diagnosis present

## 2018-07-03 DIAGNOSIS — Z880 Allergy status to penicillin: Secondary | ICD-10-CM

## 2018-07-03 DIAGNOSIS — Z794 Long term (current) use of insulin: Secondary | ICD-10-CM

## 2018-07-03 DIAGNOSIS — Z8261 Family history of arthritis: Secondary | ICD-10-CM | POA: Diagnosis not present

## 2018-07-03 DIAGNOSIS — Z743 Need for continuous supervision: Secondary | ICD-10-CM | POA: Diagnosis not present

## 2018-07-03 DIAGNOSIS — Z882 Allergy status to sulfonamides status: Secondary | ICD-10-CM | POA: Diagnosis not present

## 2018-07-03 DIAGNOSIS — R279 Unspecified lack of coordination: Secondary | ICD-10-CM | POA: Diagnosis not present

## 2018-07-03 DIAGNOSIS — I10 Essential (primary) hypertension: Secondary | ICD-10-CM | POA: Diagnosis present

## 2018-07-03 DIAGNOSIS — E1140 Type 2 diabetes mellitus with diabetic neuropathy, unspecified: Secondary | ICD-10-CM | POA: Diagnosis not present

## 2018-07-03 DIAGNOSIS — M48061 Spinal stenosis, lumbar region without neurogenic claudication: Secondary | ICD-10-CM | POA: Diagnosis present

## 2018-07-03 DIAGNOSIS — M5136 Other intervertebral disc degeneration, lumbar region: Secondary | ICD-10-CM | POA: Diagnosis not present

## 2018-07-03 DIAGNOSIS — Z809 Family history of malignant neoplasm, unspecified: Secondary | ICD-10-CM | POA: Diagnosis not present

## 2018-07-03 DIAGNOSIS — F331 Major depressive disorder, recurrent, moderate: Secondary | ICD-10-CM | POA: Diagnosis not present

## 2018-07-03 DIAGNOSIS — I959 Hypotension, unspecified: Secondary | ICD-10-CM | POA: Diagnosis not present

## 2018-07-03 HISTORY — PX: LUMBAR LAMINECTOMY/DECOMPRESSION MICRODISCECTOMY: SHX5026

## 2018-07-03 HISTORY — DX: Pneumonia, unspecified organism: J18.9

## 2018-07-03 LAB — GLUCOSE, CAPILLARY
GLUCOSE-CAPILLARY: 256 mg/dL — AB (ref 70–99)
GLUCOSE-CAPILLARY: 354 mg/dL — AB (ref 70–99)
Glucose-Capillary: 194 mg/dL — ABNORMAL HIGH (ref 70–99)
Glucose-Capillary: 350 mg/dL — ABNORMAL HIGH (ref 70–99)

## 2018-07-03 LAB — CBC
HCT: 44.2 % (ref 36.0–46.0)
HEMOGLOBIN: 13.2 g/dL (ref 12.0–15.0)
MCH: 26.9 pg (ref 26.0–34.0)
MCHC: 29.9 g/dL — ABNORMAL LOW (ref 30.0–36.0)
MCV: 90 fL (ref 80.0–100.0)
Platelets: 348 10*3/uL (ref 150–400)
RBC: 4.91 MIL/uL (ref 3.87–5.11)
RDW: 14.1 % (ref 11.5–15.5)
WBC: 14.7 10*3/uL — ABNORMAL HIGH (ref 4.0–10.5)
nRBC: 0 % (ref 0.0–0.2)

## 2018-07-03 LAB — BASIC METABOLIC PANEL
Anion gap: 8 (ref 5–15)
BUN: 23 mg/dL (ref 8–23)
CO2: 29 mmol/L (ref 22–32)
Calcium: 9.4 mg/dL (ref 8.9–10.3)
Chloride: 100 mmol/L (ref 98–111)
Creatinine, Ser: 0.98 mg/dL (ref 0.44–1.00)
GFR calc Af Amer: >60 mL/min (ref >60)
GFR calc non Af Amer: 54 mL/min — ABNORMAL LOW (ref >60)
GLUCOSE: 194 mg/dL — AB (ref 70–99)
POTASSIUM: 3.9 mmol/L (ref 3.5–5.1)
Sodium: 137 mmol/L (ref 135–145)

## 2018-07-03 LAB — HEMOGLOBIN A1C
Hgb A1c MFr Bld: 7.3 % — ABNORMAL HIGH (ref 4.8–5.6)
Mean Plasma Glucose: 162.81 mg/dL

## 2018-07-03 SURGERY — LUMBAR LAMINECTOMY/DECOMPRESSION MICRODISCECTOMY 2 LEVELS
Anesthesia: General | Site: Spine Lumbar

## 2018-07-03 MED ORDER — POLYETHYLENE GLYCOL 3350 17 G PO PACK
17.0000 g | PACK | Freq: Every day | ORAL | Status: DC | PRN
  Administered 2018-07-04 – 2018-07-07 (×2): 17 g via ORAL
  Filled 2018-07-03 (×2): qty 1

## 2018-07-03 MED ORDER — LIDOCAINE-EPINEPHRINE 1 %-1:100000 IJ SOLN
INTRAMUSCULAR | Status: AC
  Filled 2018-07-03: qty 1

## 2018-07-03 MED ORDER — LIDOCAINE 2% (20 MG/ML) 5 ML SYRINGE
INTRAMUSCULAR | Status: AC
  Filled 2018-07-03: qty 5

## 2018-07-03 MED ORDER — LEVOTHYROXINE SODIUM 137 MCG PO TABS
137.0000 ug | ORAL_TABLET | Freq: Every day | ORAL | Status: DC
  Administered 2018-07-04 – 2018-07-10 (×7): 137 ug via ORAL
  Filled 2018-07-03 (×7): qty 1

## 2018-07-03 MED ORDER — LIRAGLUTIDE 18 MG/3ML ~~LOC~~ SOPN: 1.8000 mg | PEN_INJECTOR | Freq: Every evening | SUBCUTANEOUS | Status: DC

## 2018-07-03 MED ORDER — FENTANYL CITRATE (PF) 250 MCG/5ML IJ SOLN
INTRAMUSCULAR | Status: AC
  Filled 2018-07-03: qty 5

## 2018-07-03 MED ORDER — METOPROLOL TARTRATE 50 MG PO TABS
50.0000 mg | ORAL_TABLET | Freq: Two times a day (BID) | ORAL | Status: DC
  Administered 2018-07-03 – 2018-07-10 (×14): 50 mg via ORAL
  Filled 2018-07-03 (×14): qty 1

## 2018-07-03 MED ORDER — SODIUM CHLORIDE 0.9% FLUSH
3.0000 mL | Freq: Two times a day (BID) | INTRAVENOUS | Status: DC
  Administered 2018-07-04 – 2018-07-10 (×12): 3 mL via INTRAVENOUS

## 2018-07-03 MED ORDER — SODIUM CHLORIDE 0.9 % IV SOLN
INTRAVENOUS | Status: DC | PRN
  Administered 2018-07-03: 30 ug/min via INTRAVENOUS

## 2018-07-03 MED ORDER — THROMBIN 5000 UNITS EX SOLR
OROMUCOSAL | Status: DC | PRN
  Administered 2018-07-03: 5 mL via TOPICAL

## 2018-07-03 MED ORDER — MIRABEGRON ER 50 MG PO TB24
50.0000 mg | ORAL_TABLET | Freq: Every day | ORAL | Status: DC
  Administered 2018-07-04 – 2018-07-10 (×7): 50 mg via ORAL
  Filled 2018-07-03 (×7): qty 1

## 2018-07-03 MED ORDER — INSULIN ASPART 100 UNIT/ML ~~LOC~~ SOLN
0.0000 [IU] | Freq: Three times a day (TID) | SUBCUTANEOUS | Status: DC
  Administered 2018-07-03: 15 [IU] via SUBCUTANEOUS

## 2018-07-03 MED ORDER — EPHEDRINE SULFATE 50 MG/ML IJ SOLN
INTRAMUSCULAR | Status: DC | PRN
  Administered 2018-07-03 (×2): 5 mg via INTRAVENOUS
  Administered 2018-07-03: 10 mg via INTRAVENOUS
  Administered 2018-07-03: 5 mg via INTRAVENOUS

## 2018-07-03 MED ORDER — FENTANYL CITRATE (PF) 100 MCG/2ML IJ SOLN
INTRAMUSCULAR | Status: AC
  Filled 2018-07-03: qty 2

## 2018-07-03 MED ORDER — GABAPENTIN 600 MG PO TABS
600.0000 mg | ORAL_TABLET | Freq: Every evening | ORAL | Status: DC
  Administered 2018-07-03 – 2018-07-09 (×7): 600 mg via ORAL
  Filled 2018-07-03 (×7): qty 1

## 2018-07-03 MED ORDER — SODIUM CHLORIDE 0.9 % IV SOLN
INTRAVENOUS | Status: DC | PRN
  Administered 2018-07-03: 11:00:00 via INTRAVENOUS

## 2018-07-03 MED ORDER — HYDROMORPHONE HCL 1 MG/ML IJ SOLN
0.5000 mg | INTRAMUSCULAR | Status: DC | PRN
  Administered 2018-07-04: 0.5 mg via INTRAVENOUS
  Filled 2018-07-03: qty 1

## 2018-07-03 MED ORDER — ONDANSETRON HCL 4 MG/2ML IJ SOLN
INTRAMUSCULAR | Status: DC | PRN
  Administered 2018-07-03: 4 mg via INTRAVENOUS

## 2018-07-03 MED ORDER — FENTANYL CITRATE (PF) 100 MCG/2ML IJ SOLN
INTRAMUSCULAR | Status: DC | PRN
  Administered 2018-07-03: 25 ug via INTRAVENOUS
  Administered 2018-07-03: 50 ug via INTRAVENOUS
  Administered 2018-07-03: 100 ug via INTRAVENOUS
  Administered 2018-07-03: 25 ug via INTRAVENOUS

## 2018-07-03 MED ORDER — TIZANIDINE HCL 2 MG PO TABS
2.0000 mg | ORAL_TABLET | Freq: Three times a day (TID) | ORAL | Status: DC
  Administered 2018-07-03 – 2018-07-10 (×21): 2 mg via ORAL
  Filled 2018-07-03 (×22): qty 1

## 2018-07-03 MED ORDER — DEXAMETHASONE SODIUM PHOSPHATE 10 MG/ML IJ SOLN
INTRAMUSCULAR | Status: DC | PRN
  Administered 2018-07-03: 5 mg via INTRAVENOUS

## 2018-07-03 MED ORDER — ROCURONIUM BROMIDE 50 MG/5ML IV SOSY
PREFILLED_SYRINGE | INTRAVENOUS | Status: DC | PRN
  Administered 2018-07-03: 20 mg via INTRAVENOUS
  Administered 2018-07-03: 50 mg via INTRAVENOUS
  Administered 2018-07-03: 10 mg via INTRAVENOUS

## 2018-07-03 MED ORDER — FENTANYL CITRATE (PF) 100 MCG/2ML IJ SOLN
25.0000 ug | INTRAMUSCULAR | Status: DC | PRN
  Administered 2018-07-03 (×3): 50 ug via INTRAVENOUS

## 2018-07-03 MED ORDER — OXYCODONE HCL 5 MG PO TABS
ORAL_TABLET | ORAL | Status: AC
  Filled 2018-07-03: qty 2

## 2018-07-03 MED ORDER — ALBUTEROL SULFATE (2.5 MG/3ML) 0.083% IN NEBU: 3.0000 mL | INHALATION_SOLUTION | Freq: Four times a day (QID) | RESPIRATORY_TRACT | Status: DC | PRN

## 2018-07-03 MED ORDER — AMLODIPINE BESYLATE 10 MG PO TABS
10.0000 mg | ORAL_TABLET | Freq: Every day | ORAL | Status: DC
  Administered 2018-07-04 – 2018-07-05 (×2): 10 mg via ORAL
  Filled 2018-07-03 (×2): qty 1

## 2018-07-03 MED ORDER — UMECLIDINIUM-VILANTEROL 62.5-25 MCG/INH IN AEPB
1.0000 | INHALATION_SPRAY | Freq: Every day | RESPIRATORY_TRACT | Status: DC
  Administered 2018-07-04 – 2018-07-10 (×7): 1 via RESPIRATORY_TRACT
  Filled 2018-07-03 (×2): qty 14

## 2018-07-03 MED ORDER — LISINOPRIL 20 MG PO TABS
20.0000 mg | ORAL_TABLET | Freq: Every day | ORAL | Status: DC
  Administered 2018-07-04 – 2018-07-10 (×7): 20 mg via ORAL
  Filled 2018-07-03 (×7): qty 1

## 2018-07-03 MED ORDER — CHLORHEXIDINE GLUCONATE CLOTH 2 % EX PADS
6.0000 | MEDICATED_PAD | Freq: Once | CUTANEOUS | Status: DC
  Administered 2018-07-03: 6 via TOPICAL

## 2018-07-03 MED ORDER — DEXAMETHASONE SODIUM PHOSPHATE 10 MG/ML IJ SOLN
INTRAMUSCULAR | Status: AC
  Filled 2018-07-03: qty 1

## 2018-07-03 MED ORDER — PROPOFOL 10 MG/ML IV BOLUS
INTRAVENOUS | Status: DC | PRN
  Administered 2018-07-03: 100 mg via INTRAVENOUS

## 2018-07-03 MED ORDER — CYCLOBENZAPRINE HCL 10 MG PO TABS
10.0000 mg | ORAL_TABLET | Freq: Three times a day (TID) | ORAL | Status: DC | PRN
  Administered 2018-07-04 – 2018-07-09 (×7): 10 mg via ORAL
  Filled 2018-07-03 (×7): qty 1

## 2018-07-03 MED ORDER — ACETAMINOPHEN 650 MG RE SUPP: 650.0000 mg | RECTAL | Status: DC | PRN

## 2018-07-03 MED ORDER — ACETAMINOPHEN 325 MG PO TABS
650.0000 mg | ORAL_TABLET | ORAL | Status: DC | PRN
  Administered 2018-07-07: 650 mg via ORAL
  Filled 2018-07-03: qty 2

## 2018-07-03 MED ORDER — SODIUM CHLORIDE 0.9 % IV SOLN
INTRAVENOUS | Status: DC | PRN
  Administered 2018-07-03: 500 mL

## 2018-07-03 MED ORDER — 0.9 % SODIUM CHLORIDE (POUR BTL) OPTIME
TOPICAL | Status: DC | PRN
  Administered 2018-07-03: 1000 mL

## 2018-07-03 MED ORDER — PREGABALIN 50 MG PO CAPS
150.0000 mg | ORAL_CAPSULE | Freq: Two times a day (BID) | ORAL | Status: DC
  Administered 2018-07-03 – 2018-07-10 (×14): 150 mg via ORAL
  Filled 2018-07-03 (×14): qty 1

## 2018-07-03 MED ORDER — LIDOCAINE 2% (20 MG/ML) 5 ML SYRINGE
INTRAMUSCULAR | Status: DC | PRN
  Administered 2018-07-03: 100 mg via INTRAVENOUS

## 2018-07-03 MED ORDER — PHENOL 1.4 % MT LIQD: 1.0000 | OROMUCOSAL | Status: DC | PRN

## 2018-07-03 MED ORDER — ONDANSETRON HCL 4 MG/2ML IJ SOLN
INTRAMUSCULAR | Status: AC
  Filled 2018-07-03: qty 2

## 2018-07-03 MED ORDER — POLYVINYL ALCOHOL 1.4 % OP SOLN
1.0000 [drp] | Freq: Four times a day (QID) | OPHTHALMIC | Status: DC | PRN
  Filled 2018-07-03: qty 15

## 2018-07-03 MED ORDER — NYSTATIN 100000 UNIT/GM EX CREA
1.0000 "application " | TOPICAL_CREAM | Freq: Two times a day (BID) | CUTANEOUS | Status: DC | PRN
  Administered 2018-07-05: 1 via TOPICAL
  Filled 2018-07-03: qty 15

## 2018-07-03 MED ORDER — SODIUM CHLORIDE 0.9% FLUSH: 3.0000 mL | INTRAVENOUS | Status: DC | PRN

## 2018-07-03 MED ORDER — MENTHOL 3 MG MT LOZG: 1.0000 | LOZENGE | OROMUCOSAL | Status: DC | PRN

## 2018-07-03 MED ORDER — METHYLPREDNISOLONE ACETATE 80 MG/ML IJ SUSP
INTRAMUSCULAR | Status: AC
  Filled 2018-07-03: qty 1

## 2018-07-03 MED ORDER — OXYCODONE HCL 5 MG PO TABS
10.0000 mg | ORAL_TABLET | ORAL | Status: DC | PRN
  Administered 2018-07-03 – 2018-07-10 (×17): 10 mg via ORAL
  Filled 2018-07-03 (×16): qty 2

## 2018-07-03 MED ORDER — VENLAFAXINE HCL ER 75 MG PO CP24
75.0000 mg | ORAL_CAPSULE | Freq: Every day | ORAL | Status: DC
  Administered 2018-07-04 – 2018-07-10 (×7): 75 mg via ORAL
  Filled 2018-07-03 (×7): qty 1

## 2018-07-03 MED ORDER — HEPARIN SODIUM (PORCINE) 5000 UNIT/ML IJ SOLN
5000.0000 [IU] | Freq: Three times a day (TID) | INTRAMUSCULAR | Status: DC
  Administered 2018-07-05 – 2018-07-10 (×15): 5000 [IU] via SUBCUTANEOUS
  Filled 2018-07-03 (×14): qty 1

## 2018-07-03 MED ORDER — ONDANSETRON HCL 4 MG/2ML IJ SOLN: 4.0000 mg | Freq: Four times a day (QID) | INTRAMUSCULAR | Status: DC | PRN

## 2018-07-03 MED ORDER — THROMBIN 5000 UNITS EX SOLR
CUTANEOUS | Status: AC
  Filled 2018-07-03: qty 5000

## 2018-07-03 MED ORDER — CEFAZOLIN SODIUM-DEXTROSE 2-4 GM/100ML-% IV SOLN
2.0000 g | Freq: Three times a day (TID) | INTRAVENOUS | Status: AC
  Administered 2018-07-03 (×2): 2 g via INTRAVENOUS
  Filled 2018-07-03 (×2): qty 100

## 2018-07-03 MED ORDER — ROCURONIUM BROMIDE 50 MG/5ML IV SOSY
PREFILLED_SYRINGE | INTRAVENOUS | Status: AC
  Filled 2018-07-03: qty 5

## 2018-07-03 MED ORDER — ONDANSETRON HCL 4 MG PO TABS: 4.0000 mg | ORAL_TABLET | Freq: Four times a day (QID) | ORAL | Status: DC | PRN

## 2018-07-03 MED ORDER — SUGAMMADEX SODIUM 200 MG/2ML IV SOLN
INTRAVENOUS | Status: AC
  Filled 2018-07-03: qty 2

## 2018-07-03 MED ORDER — MELATONIN 3 MG PO TABS
9.0000 mg | ORAL_TABLET | Freq: Every day | ORAL | Status: DC
  Administered 2018-07-03 – 2018-07-09 (×7): 9 mg via ORAL
  Filled 2018-07-03 (×7): qty 3

## 2018-07-03 MED ORDER — SODIUM CHLORIDE 0.9 % IV SOLN: INTRAVENOUS | Status: DC | PRN

## 2018-07-03 MED ORDER — INSULIN GLARGINE 100 UNIT/ML ~~LOC~~ SOLN
100.0000 [IU] | Freq: Every day | SUBCUTANEOUS | Status: DC
  Administered 2018-07-03 – 2018-07-09 (×6): 100 [IU] via SUBCUTANEOUS
  Filled 2018-07-03 (×8): qty 1

## 2018-07-03 MED ORDER — PREGABALIN 75 MG PO CAPS: 150.0000 mg | ORAL_CAPSULE | Freq: Two times a day (BID) | ORAL | Status: DC

## 2018-07-03 MED ORDER — NYSTATIN 100000 UNIT/GM EX POWD
1.0000 g | Freq: Every day | CUTANEOUS | Status: DC | PRN
  Filled 2018-07-03: qty 15

## 2018-07-03 MED ORDER — OXYCODONE HCL 5 MG PO TABS
5.0000 mg | ORAL_TABLET | ORAL | Status: DC | PRN
  Administered 2018-07-05 – 2018-07-08 (×3): 5 mg via ORAL
  Filled 2018-07-03 (×4): qty 1

## 2018-07-03 MED ORDER — LIDOCAINE-EPINEPHRINE 1 %-1:100000 IJ SOLN
INTRAMUSCULAR | Status: DC | PRN
  Administered 2018-07-03: 8 mL

## 2018-07-03 MED ORDER — SUGAMMADEX SODIUM 200 MG/2ML IV SOLN
INTRAVENOUS | Status: DC | PRN
  Administered 2018-07-03: 200 mg via INTRAVENOUS

## 2018-07-03 MED ORDER — DOCUSATE SODIUM 100 MG PO CAPS
100.0000 mg | ORAL_CAPSULE | Freq: Two times a day (BID) | ORAL | Status: DC
  Administered 2018-07-03 – 2018-07-10 (×14): 100 mg via ORAL
  Filled 2018-07-03 (×14): qty 1

## 2018-07-03 MED ORDER — SODIUM CHLORIDE 0.9 % IV SOLN
250.0000 mL | INTRAVENOUS | Status: DC
  Administered 2018-07-03: 250 mL via INTRAVENOUS

## 2018-07-03 MED ORDER — TRIAMTERENE-HCTZ 37.5-25 MG PO TABS
1.0000 | ORAL_TABLET | Freq: Every day | ORAL | Status: DC
  Administered 2018-07-03 – 2018-07-10 (×8): 1 via ORAL
  Filled 2018-07-03 (×8): qty 1

## 2018-07-03 MED ORDER — LACTATED RINGERS IV SOLN
INTRAVENOUS | Status: DC
  Administered 2018-07-03: 11:00:00 via INTRAVENOUS

## 2018-07-03 MED ORDER — EPHEDRINE 5 MG/ML INJ
INTRAVENOUS | Status: AC
  Filled 2018-07-03: qty 10

## 2018-07-03 SURGICAL SUPPLY — 58 items
ADH SKN CLS APL DERMABOND .7 (GAUZE/BANDAGES/DRESSINGS) ×1
BAG DECANTER FOR FLEXI CONT (MISCELLANEOUS) ×2 IMPLANT
BLADE CLIPPER SURG (BLADE) IMPLANT
BLADE SURG 11 STRL SS (BLADE) ×2 IMPLANT
BUR MATCHSTICK NEURO 3.0 LAGG (BURR) ×1 IMPLANT
BUR PRECISION FLUTE 5.0 (BURR) ×1 IMPLANT
CANISTER SUCT 3000ML PPV (MISCELLANEOUS) ×2 IMPLANT
COVER WAND RF STERILE (DRAPES) ×2 IMPLANT
DECANTER SPIKE VIAL GLASS SM (MISCELLANEOUS) ×2 IMPLANT
DERMABOND ADVANCED (GAUZE/BANDAGES/DRESSINGS) ×1
DERMABOND ADVANCED .7 DNX12 (GAUZE/BANDAGES/DRESSINGS) ×1 IMPLANT
DRAPE C-ARM 42X72 X-RAY (DRAPES) ×4 IMPLANT
DRAPE LAPAROTOMY 100X72X124 (DRAPES) ×2 IMPLANT
DRAPE MICROSCOPE LEICA (MISCELLANEOUS) ×2 IMPLANT
DRAPE SURG 17X23 STRL (DRAPES) ×1 IMPLANT
DRSG OPSITE POSTOP 4X8 (GAUZE/BANDAGES/DRESSINGS) IMPLANT
DURAPREP 26ML APPLICATOR (WOUND CARE) ×2 IMPLANT
ELECT BLADE 4.0 EZ CLEAN MEGAD (MISCELLANEOUS) ×2
ELECT REM PT RETURN 9FT ADLT (ELECTROSURGICAL) ×2
ELECTRODE BLDE 4.0 EZ CLN MEGD (MISCELLANEOUS) IMPLANT
ELECTRODE REM PT RTRN 9FT ADLT (ELECTROSURGICAL) ×1 IMPLANT
FLOSEAL 5ML (HEMOSTASIS) ×1 IMPLANT
GAUZE 4X4 16PLY RFD (DISPOSABLE) IMPLANT
GAUZE SPONGE 4X4 12PLY STRL (GAUZE/BANDAGES/DRESSINGS) IMPLANT
GLOVE BIO SURGEON STRL SZ7 (GLOVE) ×1 IMPLANT
GLOVE BIO SURGEON STRL SZ7.5 (GLOVE) ×2 IMPLANT
GLOVE BIOGEL PI IND STRL 7.5 (GLOVE) ×1 IMPLANT
GLOVE BIOGEL PI INDICATOR 7.5 (GLOVE) ×2
GLOVE ECLIPSE 6.5 STRL STRAW (GLOVE) ×2 IMPLANT
GLOVE EXAM NITRILE LRG STRL (GLOVE) IMPLANT
GLOVE EXAM NITRILE XL STR (GLOVE) IMPLANT
GLOVE EXAM NITRILE XS STR PU (GLOVE) IMPLANT
GLOVE SURG SS PI 7.0 STRL IVOR (GLOVE) ×3 IMPLANT
GOWN STRL REUS W/ TWL LRG LVL3 (GOWN DISPOSABLE) ×2 IMPLANT
GOWN STRL REUS W/ TWL XL LVL3 (GOWN DISPOSABLE) IMPLANT
GOWN STRL REUS W/TWL 2XL LVL3 (GOWN DISPOSABLE) IMPLANT
GOWN STRL REUS W/TWL LRG LVL3 (GOWN DISPOSABLE) ×6
GOWN STRL REUS W/TWL XL LVL3 (GOWN DISPOSABLE)
HEMOSTAT POWDER SURGIFOAM 1G (HEMOSTASIS) ×1 IMPLANT
KIT BASIN OR (CUSTOM PROCEDURE TRAY) ×2 IMPLANT
KIT TURNOVER KIT B (KITS) ×2 IMPLANT
NDL HYPO 18GX1.5 BLUNT FILL (NEEDLE) IMPLANT
NDL SPNL 18GX3.5 QUINCKE PK (NEEDLE) IMPLANT
NEEDLE HYPO 18GX1.5 BLUNT FILL (NEEDLE) IMPLANT
NEEDLE HYPO 22GX1.5 SAFETY (NEEDLE) ×2 IMPLANT
NEEDLE SPNL 18GX3.5 QUINCKE PK (NEEDLE) IMPLANT
NEEDLE SPNL 22GX3.5 QUINCKE BK (NEEDLE) ×2 IMPLANT
NS IRRIG 1000ML POUR BTL (IV SOLUTION) ×2 IMPLANT
PACK LAMINECTOMY NEURO (CUSTOM PROCEDURE TRAY) ×2 IMPLANT
PAD ARMBOARD 7.5X6 YLW CONV (MISCELLANEOUS) ×8 IMPLANT
RUBBERBAND STERILE (MISCELLANEOUS) ×4 IMPLANT
SPONGE LAP 4X18 RFD (DISPOSABLE) IMPLANT
SUT MNCRL AB 3-0 PS2 18 (SUTURE) ×2 IMPLANT
SUT VIC AB 2-0 CT2 18 VCP726D (SUTURE) ×7 IMPLANT
SYR 3ML LL SCALE MARK (SYRINGE) IMPLANT
TOWEL GREEN STERILE (TOWEL DISPOSABLE) ×2 IMPLANT
TOWEL GREEN STERILE FF (TOWEL DISPOSABLE) ×2 IMPLANT
WATER STERILE IRR 1000ML POUR (IV SOLUTION) ×2 IMPLANT

## 2018-07-03 NOTE — Brief Op Note (Signed)
07/03/2018  2:05 PM  PATIENT:  [name]  77 y.o. female  PRE-OPERATIVE DIAGNOSIS:  Lumbar stenosis with neurogenic claudication  POST-OPERATIVE DIAGNOSIS:  Lumbar stenosis with neurogenic claudication  PROCEDURE:  Procedure(s): Lumbar Three to Lumbar Five Laminectomy (N/A)  SURGEON:  Surgeon(s) and Role:    * Judith Part, MD - Primary    * Ashok Pall, MD - Assisting  ANESTHESIA:   general  EBL:  25 mL   BLOOD ADMINISTERED:none  DRAINS: none   LOCAL MEDICATIONS USED:  LIDOCAINE   SPECIMEN:  No Specimen  DISPOSITION OF SPECIMEN:  N/A  COUNTS:  YES  TOURNIQUET:  * No tourniquets in log *  DICTATION: .Note written in EPIC  PLAN OF CARE: Admit to inpatient   PATIENT DISPOSITION:  PACU - hemodynamically stable.   Delay start of Pharmacological VTE agent (>24hrs) due to surgical blood loss or risk of bleeding: yes

## 2018-07-03 NOTE — Social Work (Signed)
CSW acknowledging consult for SNF placement. Will follow for therapy recommendations.   Terrion Gencarelli H Amardeep Beckers, LCSWA [facility] Social Work [phone]   

## 2018-07-03 NOTE — Plan of Care (Signed)
  Problem: Health Behavior/Discharge Planning: Goal: Ability to manage health-related needs will improve Outcome: Progressing   Problem: Clinical Measurements: Goal: Ability to maintain clinical measurements within normal limits will improve Outcome: Progressing   Problem: Clinical Measurements: Goal: Will remain free from infection Outcome: Progressing   Problem: Clinical Measurements: Goal: Diagnostic test results will improve Outcome: Progressing   Problem: Clinical Measurements: Goal: Respiratory complications will improve Outcome: Progressing   Problem: Clinical Measurements: Goal: Cardiovascular complication will be avoided Outcome: Progressing   Problem: Activity: Goal: Risk for activity intolerance will decrease Outcome: Progressing

## 2018-07-03 NOTE — Op Note (Signed)
PATIENT: Katherine Delgado  DAY OF SURGERY: 07/03/18   PRE-OPERATIVE DIAGNOSIS:  Lumbar stenosis with neurogenic claudication   POST-OPERATIVE DIAGNOSIS:  Lumbar stenosis with neurogenic claudication   PROCEDURE:  L3-L5 lumbar laminectomies   SURGEON:  Surgeon(s) and Role:    Judith Part, MD - Primary    Ashok Pall, MD - Assisting   ANESTHESIA: ETGA   BRIEF HISTORY: This is a 77yo woman who presented with progressive worsening back and lower extremity pain with neurogenic claudication. The patient was found to have matching lumbar stenosis on her MRI. This was discussed with the patient as well as risks, benefits, and alternatives and wished to proceed with surgical decompression.   OPERATIVE DETAIL: The patient was taken to the operating room and placed on the OR table in the prone position. A formal time out was performed with two patient identifiers and confirmed the operative site. Anesthesia was induced by the anesthesia team. The operative site was marked, hair was clipped with surgical clippers, the area was then prepped and draped in a sterile fashion. An incision was placed in the lumbar midline. Fluoroscopy was used to confirm the operative level. Subperiosteal dissection was performed to expose the L3, L4, and L5 lamina. There was significant scar tissue in both the soft tissues and epidural space from the patient's prior lumbar surgery. The L3, L4, and L5 lamina were then removed with a combination of high speed drill and rongeurs. There was significant epidural scarring in and adjacent to the prior laminar defect, which required significant additional time to safely dissect. Following decompression, the thecal sac expanded well, there were no areas of dural defect, and there were no palpable areas of stenosis at the adjacent levels. Hemostatis was obtained, all instrument and sponge counts were correct, the incision was then closed in layers. The patient was then returned to  anesthesia for emergence. No apparent complications at the completion of the procedure.   EBL:  15mL   DRAINS: none   SPECIMENS: none   Judith Part, MD 07/03/18 2:06 PM

## 2018-07-03 NOTE — H&P (Signed)
Surgical H&P Update  HPI: 77 y.o. woman w/ progressively worsening low back and leg pain. Over the past 60mo she has lost the ability to ambulate and has been wheelchair bound due to pain. An MRI showed severe lumbar stenosis that correlated with her symptoms. ESI and narcotic medications did help some with the back pain but she was still unable to ambulate due to claudication.   PMHx:  Past Medical History:  Diagnosis Date  . Abnormal weight gain   . Abnormality of gait 04/19/2016  . Acute infective polyneuritis ([place]) 2002  . Acute maxillary sinusitis   . Acute sinusitis, unspecified   . Allergic rhinitis due to pollen   . Arthritis   . Back injury   . Candidiasis of skin and nails   . Candidiasis of vulva and vagina   . Chronic pain syndrome   . COPD (chronic obstructive pulmonary disease) ([place])   . Cough   . Degenerative arthritis   . Depressive disorder, not elsewhere classified   . Diabetes mellitus without complication ([place])   . Diaphragmatic hernia without mention of obstruction or gangrene   . Dyslipidemia   . Edema   . Fibromyalgia   . GERD (gastroesophageal reflux disease)   . Guillain-Barre syndrome ([place])   . History of benign thymus tumor   . Hypertension   . Insomnia, unspecified   . Lumbago   . Lumbar spinal stenosis 03/30/2018   L4-5 level, severe  . Miscarriage 1962  . Mixed hyperlipidemia   . Morbid obesity ([place])   . Obstructive chronic bronchitis with acute bronchitis ([place])   . Osteopenia, senile   . Osteoporosis   . Other malaise and fatigue   . Other specified disease of white blood cells   . Pain in joint, multiple sites   . Pneumonia   . Polyneuropathy in diabetes(357.2)   . RA (rheumatoid arthritis) ([place])   . Reflux esophagitis   . Rheumatoid arthritis(714.0)   . Shortness of breath   . Spinal stenosis, lumbar region, without neurogenic claudication   . Spondylosis, lumbosacral   . Spontaneous ecchymoses   . Stiffness of joints, not elsewhere  classified, multiple sites   . Tear film insufficiency, unspecified   . Thyroid disease   . Thyroid disorder   . Type II or unspecified type diabetes mellitus with peripheral circulatory disorders, uncontrolled(250.72)   . Unspecified chronic bronchitis ([place])   . Unspecified essential hypertension   . Unspecified hypothyroidism   . Unspecified pruritic disorder   . Unspecified urinary incontinence   . Urinary tract infection, site not specified    FamHx:  Family History  Problem Relation Age of Onset  . Alzheimer's disease Mother   . Heart disease Mother   . Heart disease Father   . Liver disease Father   . Cancer Brother   . Arthritis Son   . Colon polyps Brother   . Colon cancer Neg Hx   . Esophageal cancer Neg Hx   . Kidney disease Neg Hx   . Stomach cancer Neg Hx   . Rectal cancer Neg Hx    SocHx:  reports that she quit smoking about 38 years ago. Her smoking use included cigarettes. She has never used smokeless tobacco. She reports that she does not drink alcohol or use drugs.  Physical Exam: AOx3, PERRL, FS, TM  Strength 5/5 x4, SILTx4 except L L5 numbness  Assesment/Plan: 77 y.o. woman with neurogenic claudication, here for lumbar laminectomies for spinal decompression. Risks, benefits, and  alternatives discussed and the patient would like to continue with surgery. We have discussed at length that her multiple co-morbidities significantly increase the risk of peri-operative complications. However, since she is no longer ambulatory and the pain is debilitating, she would like to proceed with surgery in the hopes that she can regain the ability to ambulate. -OR today -4NP post-op  Judith Part, MD 07/03/18 10:33 AM

## 2018-07-03 NOTE — Anesthesia Preprocedure Evaluation (Signed)
Anesthesia Evaluation  Patient identified by MRN, date of birth, ID band Patient awake    Reviewed: Allergy & Precautions, NPO status   Airway Mallampati: II  TM Distance: >3 FB     Dental   Pulmonary shortness of breath, pneumonia, COPD, former smoker,    breath sounds clear to auscultation       Cardiovascular hypertension,  Rhythm:Regular Rate:Normal     Neuro/Psych    GI/Hepatic Neg liver ROS, GERD  ,  Endo/Other  diabetesHypothyroidism   Renal/GU negative Renal ROS     Musculoskeletal   Abdominal   Peds  Hematology   Anesthesia Other Findings   Reproductive/Obstetrics                             Anesthesia Physical Anesthesia Plan  ASA: III  Anesthesia Plan: General   Post-op Pain Management:    Induction: Intravenous  PONV Risk Score and Plan: 3 and Ondansetron, Dexamethasone and Midazolam  Airway Management Planned: Oral ETT  Additional Equipment:   Intra-op Plan:   Post-operative Plan: Possible Post-op intubation/ventilation  Informed Consent: I have reviewed the patients History and Physical, chart, labs and discussed the procedure including the risks, benefits and alternatives for the proposed anesthesia with the patient or authorized representative who has indicated his/her understanding and acceptance.   Dental advisory given  Plan Discussed with: CRNA and Anesthesiologist  Anesthesia Plan Comments:         Anesthesia Quick Evaluation

## 2018-07-03 NOTE — Anesthesia Procedure Notes (Signed)
Procedure Name: Intubation Performed by: Candis Shine, CRNA Pre-anesthesia Checklist: Patient identified, Emergency Drugs available, Suction available and Patient being monitored Patient Re-evaluated:Patient Re-evaluated prior to induction Oxygen Delivery Method: Circle System Utilized Preoxygenation: Pre-oxygenation with 100% oxygen Induction Type: IV induction Ventilation: Mask ventilation without difficulty Laryngoscope Size: Mac and 3 Grade View: Grade I Tube type: Oral Tube size: 7.0 mm Number of attempts: 1 Airway Equipment and Method: Stylet and Oral airway Placement Confirmation: ETT inserted through vocal cords under direct vision,  positive ETCO2 and breath sounds checked- equal and bilateral Secured at: 21 cm Tube secured with: Tape Dental Injury: Teeth and Oropharynx as per pre-operative assessment

## 2018-07-03 NOTE — Transfer of Care (Signed)
Immediate Anesthesia Transfer of Care Note  Patient: Katherine Delgado [facility]  Procedure(s) Performed: Lumbar Three to Lumbar Five Laminectomy (N/A Spine Lumbar)  Patient Location: PACU  Anesthesia Type:General  Level of Consciousness: awake, drowsy and patient cooperative  Airway & Oxygen Therapy: Patient Spontanous Breathing and Patient connected to face mask oxygen  Post-op Assessment: Report given to RN and Post -op Vital signs reviewed and stable  Post vital signs: Reviewed and stable  Last Vitals:  Vitals Value Taken Time  BP 150/59 07/03/2018  2:05 PM  Temp    Pulse 102 07/03/2018  2:06 PM  Resp 20 07/03/2018  2:06 PM  SpO2 98 % 07/03/2018  2:06 PM  Vitals shown include unvalidated device data.  Last Pain:  Vitals:   07/03/18 1021  TempSrc: Oral  PainSc: 8       Patients Stated Pain Goal: 3 (35/00/93 8182)  Complications: No apparent anesthesia complications

## 2018-07-03 NOTE — Anesthesia Postprocedure Evaluation (Signed)
Anesthesia Post Note  Patient: Katherine Delgado  Procedure(s) Performed: Lumbar Three to Lumbar Five Laminectomy (N/A Spine Lumbar)     Patient location during evaluation: PACU Anesthesia Type: General Level of consciousness: awake Pain management: pain level controlled Vital Signs Assessment: post-procedure vital signs reviewed and stable Respiratory status: spontaneous breathing Cardiovascular status: stable Postop Assessment: no apparent nausea or vomiting Anesthetic complications: no    Last Vitals:  Vitals:   07/03/18 1536 07/03/18 1636  BP:  (!) 143/69  Pulse: 95 97  Resp: 20 20  Temp: 37.1 C 37.2 C  SpO2: 100% 100%    Last Pain:  Vitals:   07/03/18 1636  TempSrc: Oral  PainSc: 6                  Young Brim

## 2018-07-03 NOTE — Progress Notes (Signed)
Dr. Nyoka Cowden made aware of CBG 256 on arrival to PACU. No new orders given.

## 2018-07-03 NOTE — Progress Notes (Signed)
Inpatient Diabetes Program Recommendations  AACE/ADA: New Consensus Statement on Inpatient Glycemic Control (2015)  Target Ranges:  Prepandial:   less than 140 mg/dL      Peak postprandial:   less than 180 mg/dL (1-2 hours)      Critically ill patients:  140 - 180 mg/dL   Lab Results  Component Value Date   GLUCAP 256 (H) 07/03/2018   HGBA1C 7.3 (H) 07/03/2018    Review of Glycemic Control Results for Katherine Delgado, Katherine Delgado (MRN [id number]) as of 07/03/2018 14:47  Ref. Range 07/03/2018 10:12 07/03/2018 14:10  Glucose-Capillary Latest Ref Range: 70 - 99 mg/dL 194 (H) 256 (H)   Diabetes history:  Type 2 DM  Outpatient Diabetes medications:  Toujeo 100 units q HS, Humalog 20 units tid with meals Current orders for Inpatient glycemic control:  Still in PACU  Inpatient Diabetes Program Recommendations:    Please consider:  -restarting Lantus 70 units q HS while in the hospital  -adding  Novolog 10 units tid with meals (hold if patient eats less than 50%,  NPO or blood sugar <80 mg/d)   -Add Novolog moderate correction tid with meals and HS  Thanks,  Adah Perl, RN, BC-ADM Inpatient Diabetes Coordinator Pager [phone] (8a-5p)

## 2018-07-04 ENCOUNTER — Other Ambulatory Visit: Payer: Self-pay

## 2018-07-04 ENCOUNTER — Encounter (HOSPITAL_COMMUNITY): Payer: Self-pay | Admitting: Neurological Surgery

## 2018-07-04 LAB — GLUCOSE, CAPILLARY
GLUCOSE-CAPILLARY: 187 mg/dL — AB (ref 70–99)
GLUCOSE-CAPILLARY: 119 mg/dL — AB (ref 70–99)
GLUCOSE-CAPILLARY: 151 mg/dL — AB (ref 70–99)
Glucose-Capillary: 221 mg/dL — ABNORMAL HIGH (ref 70–99)

## 2018-07-04 MED ORDER — INSULIN ASPART 100 UNIT/ML ~~LOC~~ SOLN
10.0000 [IU] | Freq: Three times a day (TID) | SUBCUTANEOUS | Status: DC
  Administered 2018-07-04 – 2018-07-10 (×15): 10 [IU] via SUBCUTANEOUS

## 2018-07-04 MED ORDER — INSULIN ASPART 100 UNIT/ML ~~LOC~~ SOLN
0.0000 [IU] | Freq: Three times a day (TID) | SUBCUTANEOUS | Status: DC
  Administered 2018-07-04: 5 [IU] via SUBCUTANEOUS
  Administered 2018-07-04: 3 [IU] via SUBCUTANEOUS
  Administered 2018-07-05: 2 [IU] via SUBCUTANEOUS
  Administered 2018-07-06: 0 [IU] via SUBCUTANEOUS
  Administered 2018-07-07 – 2018-07-09 (×5): 3 [IU] via SUBCUTANEOUS
  Administered 2018-07-10: 2 [IU] via SUBCUTANEOUS
  Administered 2018-07-10: 3 [IU] via SUBCUTANEOUS

## 2018-07-04 MED ORDER — INSULIN ASPART 100 UNIT/ML ~~LOC~~ SOLN: 0.0000 [IU] | Freq: Four times a day (QID) | SUBCUTANEOUS | Status: DC

## 2018-07-04 NOTE — Progress Notes (Signed)
Neurosurgery Service Progress Note  Subjective: No acute events overnight, pain well controlled, no radicular pain, moderate back pain   Objective: Vitals:   07/04/18 0300 07/04/18 0421 07/04/18 0834 07/04/18 0841  BP: (!) 105/40 (!) 115/42  (!) 122/59  Pulse:  71  75  Resp:      Temp: 98.4 F (36.9 C) 98.5 F (36.9 C)  97.7 F (36.5 C)  TempSrc: Oral Oral  Oral  SpO2:  97% 95% 95%  Weight:      Height:       Temp (24hrs), Avg:98.4 F (36.9 C), Min:97.6 F (36.4 C), Max:98.9 F (37.2 C)  CBC Latest Ref Rng & Units 07/03/2018 04/06/2018 03/06/2018  WBC 4.0 - 10.5 K/uL 14.7(H) 14.5(H) 16.6(H)  Hemoglobin 12.0 - 15.0 g/dL 13.2 13.2 13.7  Hematocrit 36.0 - 46.0 % 44.2 39.7 42.1  Platelets 150 - 400 K/uL 348 346 307   BMP Latest Ref Rng & Units 07/03/2018 04/06/2018 01/22/2018  Glucose 70 - 99 mg/dL 194(H) 102(H) 198(H)  BUN 8 - 23 mg/dL 23 18 14   Creatinine 0.44 - 1.00 mg/dL 0.98 0.93 0.97(H)  BUN/Creat Ratio 6 - 22 (calc) - NOT APPLICABLE 14  Sodium 509 - 145 mmol/L 137 141 139  Potassium 3.5 - 5.1 mmol/L 3.9 4.8 4.6  Chloride 98 - 111 mmol/L 100 103 101  CO2 22 - 32 mmol/L 29 31 30   Calcium 8.9 - 10.3 mg/dL 9.4 9.1 9.7    Intake/Output Summary (Last 24 hours) at 07/04/2018 0846 Last data filed at 07/04/2018 0422 Gross per 24 hour  Intake 1490 ml  Output 850 ml  Net 640 ml    Current Facility-Administered Medications:  .  0.9 %  sodium chloride infusion, 250 mL, Intravenous, Continuous, Ples Trudel, Joyice Faster, MD, Last Rate: 1 mL/hr at 07/03/18 1821, 250 mL at 07/03/18 1821 .  acetaminophen (TYLENOL) tablet 650 mg, 650 mg, Oral, Q4H PRN **OR** acetaminophen (TYLENOL) suppository 650 mg, 650 mg, Rectal, Q4H PRN, Lonita Debes A, MD .  albuterol (PROVENTIL) (2.5 MG/3ML) 0.083% nebulizer solution 3 mL, 3 mL, Inhalation, Q6H PRN, Judith Part, MD .  amLODipine (NORVASC) tablet 10 mg, 10 mg, Oral, Daily, Jazzalyn Loewenstein A, MD .  cyclobenzaprine (FLEXERIL) tablet  10 mg, 10 mg, Oral, TID PRN, Judith Part, MD .  docusate sodium (COLACE) capsule 100 mg, 100 mg, Oral, BID, Judith Part, MD, 100 mg at 07/03/18 2137 .  gabapentin (NEURONTIN) tablet 600 mg, 600 mg, Oral, QPM, Johnna Bollier, Joyice Faster, MD, 600 mg at 07/03/18 1812 .  [START ON 07/05/2018] heparin injection 5,000 Units, 5,000 Units, Subcutaneous, Q8H, Jaymeson Mengel A, MD .  HYDROmorphone (DILAUDID) injection 0.5 mg, 0.5 mg, Intravenous, Q2H PRN, Alyviah Crandle A, MD .  insulin aspart (novoLOG) injection 0-15 Units, 0-15 Units, Subcutaneous, TID WC, Vickye Astorino A, MD .  insulin aspart (novoLOG) injection 10 Units, 10 Units, Subcutaneous, TID WC, Nakisha Chai A, MD .  insulin glargine (LANTUS) injection 100 Units, 100 Units, Subcutaneous, QHS, Judith Part, MD, 100 Units at 07/03/18 2151 .  levothyroxine (SYNTHROID, LEVOTHROID) tablet 137 mcg, 137 mcg, Oral, QAC breakfast, Konya Fauble A, MD .  lisinopril (PRINIVIL,ZESTRIL) tablet 20 mg, 20 mg, Oral, Daily, Braelynn Lupton, Joyice Faster, MD .  Melatonin TABS 9 mg, 9 mg, Oral, QHS, Shamia Uppal, Joyice Faster, MD, 9 mg at 07/03/18 2137 .  menthol-cetylpyridinium (CEPACOL) lozenge 3 mg, 1 lozenge, Oral, PRN **OR** phenol (CHLORASEPTIC) mouth spray 1 spray, 1 spray, Mouth/Throat, PRN, Emelda Brothers  A, MD .  metoprolol tartrate (LOPRESSOR) tablet 50 mg, 50 mg, Oral, BID, Judith Part, MD, 50 mg at 07/03/18 2139 .  mirabegron ER (MYRBETRIQ) tablet 50 mg, 50 mg, Oral, Daily, Angenette Daily A, MD .  nystatin (MYCOSTATIN/NYSTOP) topical powder 1 g, 1 g, Topical, Daily PRN, Judith Part, MD .  nystatin cream (MYCOSTATIN) 1 application, 1 application, Topical, BID PRN, Judith Part, MD .  ondansetron (ZOFRAN) tablet 4 mg, 4 mg, Oral, Q6H PRN **OR** ondansetron (ZOFRAN) injection 4 mg, 4 mg, Intravenous, Q6H PRN, Judith Part, MD .  oxyCODONE (Oxy IR/ROXICODONE) immediate release tablet 10 mg, 10 mg, Oral,  Q4H PRN, Judith Part, MD, 10 mg at 07/03/18 2147 .  oxyCODONE (Oxy IR/ROXICODONE) immediate release tablet 5 mg, 5 mg, Oral, Q4H PRN, Nadra Hritz A, MD .  polyethylene glycol (MIRALAX / GLYCOLAX) packet 17 g, 17 g, Oral, Daily PRN, Judith Part, MD .  polyvinyl alcohol (LIQUIFILM TEARS) 1.4 % ophthalmic solution 1 drop, 1 drop, Both Eyes, QID PRN, Judith Part, MD .  pregabalin (LYRICA) capsule 150 mg, 150 mg, Oral, BID, Judith Part, MD, 150 mg at 07/03/18 2136 .  sodium chloride flush (NS) 0.9 % injection 3 mL, 3 mL, Intravenous, Q12H, Patric Buckhalter A, MD .  sodium chloride flush (NS) 0.9 % injection 3 mL, 3 mL, Intravenous, PRN, Judith Part, MD .  tiZANidine (ZANAFLEX) tablet 2 mg, 2 mg, Oral, TID, Judith Part, MD, 2 mg at 07/03/18 2146 .  triamterene-hydrochlorothiazide (MAXZIDE-25) 37.5-25 MG per tablet 1 tablet, 1 tablet, Oral, Daily, Syrah Daughtrey, Joyice Faster, MD, 1 tablet at 07/03/18 1811 .  umeclidinium-vilanterol (ANORO ELLIPTA) 62.5-25 MCG/INH 1 puff, 1 puff, Inhalation, Daily, Judith Part, MD, 1 puff at 07/04/18 [phone] .  venlafaxine XR (EFFEXOR-XR) 24 hr capsule 75 mg, 75 mg, Oral, Q breakfast, Jolleen Seman, Joyice Faster, MD   Physical Exam: AOx3, PERRL, EOMI, FS, TM, Strength 5/5 x4, SILTx4, no drift  Assessment & Plan: 77 y.o. woman s/p L3-5 laminectomies, recovering well. -OOB w/ PT/OT -d/c foley -elevated CBG post-op, started ISS -SCDs/TEDs, SQH on POD2  Judith Part  07/04/18 8:46 AM

## 2018-07-04 NOTE — Plan of Care (Signed)
  Problem: Health Behavior/Discharge Planning: Goal: Ability to manage health-related needs will improve Outcome: Progressing   Problem: Clinical Measurements: Goal: Ability to maintain clinical measurements within normal limits will improve Outcome: Progressing   

## 2018-07-04 NOTE — Evaluation (Signed)
Occupational Therapy Evaluation Patient Details Name: Katherine Delgado MRN: [id number] DOB: 1941-03-20 Today's Date: 07/04/2018    History of Present Illness 77 y.o. female admitted on 07/03/18 for elective L3-5 lumbar leaminectomies.  Pt with significant PMH of DM, thyroid disease, RA, osteoporosis, morbid obesity, HTN, Guillian Barre syndrome, fibromyalgia, COPD, bil knee surgery, back surgery (6219).     Clinical Impression   PTA pt largely WC level transfers, but did use DME in home for mobility. Pt was supervision for bathing - otherwise independent in ADL. Today Pt is mod to max A for LB ADL and required mod to max cues for maintaining back precautions throughout session with functional transfers (mod A for boost and cues for safe hand placement). Pt will benefit from skilled OT in the acute setting and should have 24 hour supervision upon discharge initially to assist with LB ADL. Next session to focus on practice with AE kit (verbally introduced today).     Follow Up Recommendations  Supervision/Assistance - 24 hour    Equipment Recommendations  None recommended by OT(Pt has appropriate DME - might want to consider AE)    Recommendations for Other Services       Precautions / Restrictions Precautions Precautions: Fall;Back Precaution Booklet Issued: Yes (comment) Precaution Comments: back precaution handout given and reviewed with pt.  Required Braces or Orthoses: (none)      Mobility Bed Mobility Overal bed mobility: Needs Assistance Bed Mobility: Rolling;Sidelying to Sit;Sit to Sidelying Rolling: Min assist Sidelying to sit: Min assist(trunk elevation assist, use of bed pad to bring hips EOB)     Sit to sidelying: Mod assist(for BLE back into bed) General bed mobility comments: vc for precautions/sequencing throughout  Transfers Overall transfer level: Needs assistance Equipment used: Rolling walker (2 wheeled) Transfers: Sit to/from [facility] Sit  to Stand: Mod assist Stand pivot transfers: Min assist       General transfer comment: Mod assist to stand from low bed, verbal cues for safe hand placement, min assist once up to take pivotal steps to the recliner. max VC as Pt likes to bend forward for boost    Balance Overall balance assessment: Needs assistance Sitting-balance support: Feet supported;No upper extremity supported Sitting balance-Leahy Scale: Good     Standing balance support: Bilateral upper extremity supported Standing balance-Leahy Scale: Poor Standing balance comment: needs RW and external assist.                            ADL either performed or assessed with clinical judgement   ADL Overall ADL's : Needs assistance/impaired Eating/Feeding: Modified independent;Sitting   Grooming: Set up;Sitting;Applying deodorant;Wash/dry face;Wash/dry hands   Upper Body Bathing: Set up;Sitting   Lower Body Bathing: Moderate assistance;Sitting/lateral leans Lower Body Bathing Details (indicate cue type and reason): assist for the knees down Upper Body Dressing : Set up;Sitting Upper Body Dressing Details (indicate cue type and reason): to don hospital gown post-bath Lower Body Dressing: Maximal assistance;Sit to/from stand   Toilet Transfer: Moderate assistance;Stand-pivot;RW   Toileting- Clothing Manipulation and Hygiene: Maximal assistance;Sit to/from stand Toileting - Clothing Manipulation Details (indicate cue type and reason): OT performed in standing as Pt required BUE for support/balance       General ADL Comments: Pt with zero recall of BLT back precautions     Vision Patient Visual Report: No change from baseline       Perception     Praxis  Pertinent Vitals/Pain Pain Assessment: Faces Faces Pain Scale: Hurts little more Pain Location: back and right leg Pain Descriptors / Indicators: Aching;Burning Pain Intervention(s): Monitored during session;Repositioned     Hand  Dominance     Extremity/Trunk Assessment Upper Extremity Assessment Upper Extremity Assessment: Generalized weakness   Lower Extremity Assessment Lower Extremity Assessment: Defer to PT evaluation   Cervical / Trunk Assessment Cervical / Trunk Assessment: Other exceptions Cervical / Trunk Exceptions: this is her second back surgery per chart.   Communication Communication Communication: HOH   Cognition Arousal/Alertness: Awake/alert Behavior During Therapy: WFL for tasks assessed/performed Overall Cognitive Status: Within Functional Limits for tasks assessed                                     General Comments       Exercises     Shoulder Instructions      Home Living Family/patient expects to be discharged to:: Private residence Living Arrangements: Children;Other (Comment)(son) Available Help at Discharge: Family;Available 24 hours/day Type of Home: House Home Access: Stairs to enter [employer] of Steps: 4 Entrance Stairs-Rails: None Home Layout: One level     Bathroom Shower/Tub: Teacher, early years/pre: Standard     Home Equipment: Environmental consultant - 2 wheels;Cane - single point;Shower seat;Bedside commode;Grab bars - tub/shower;Hand held shower head          Prior Functioning/Environment Level of Independence: Independent with assistive device(s);Needs assistance  Gait / Transfers Assistance Needed: uses RW vs WC depending on how she is feeling that day.  ADL's / Homemaking Assistance Needed: son makes sure she gets into and out of the shower ok, but she does her washing.            OT Problem List: Decreased range of motion;Decreased activity tolerance;Impaired balance (sitting and/or standing);Decreased safety awareness;Decreased knowledge of use of DME or AE;Decreased knowledge of precautions;Obesity;Pain      OT Treatment/Interventions: Self-care/ADL training;DME and/or AE instruction;Therapeutic  activities;Patient/family education;Balance training    OT Goals(Current goals can be found in the care plan section) Acute Rehab OT Goals Patient Stated Goal: to go home at d/c OT Goal Formulation: With patient Time For Goal Achievement: 07/18/18 Potential to Achieve Goals: Good ADL Goals Pt Will Perform Lower Body Bathing: with supervision;with adaptive equipment;sit to/from stand Pt Will Perform Lower Body Dressing: with min assist;with caregiver independent in assisting;with adaptive equipment;sit to/from stand Pt Will Transfer to Toilet: ambulating;with supervision Pt Will Perform Toileting - Clothing Manipulation and hygiene: with mod assist;with adaptive equipment;sit to/from stand Additional ADL Goal #1: Pt will perform bed mobiilty at supervision level (demonstrating log roll technique) prior to engaging in ADL activity  OT Frequency: Min 2X/week   Barriers to D/C:            Co-evaluation              AM-PAC PT "6 Clicks" Daily Activity     Outcome Measure Help from another person eating meals?: None Help from another person taking care of personal grooming?: None(in sitting) Help from another person toileting, which includes using toliet, bedpan, or urinal?: A Lot Help from another person bathing (including washing, rinsing, drying)?: A Lot Help from another person to put on and taking off regular upper body clothing?: A Little Help from another person to put on and taking off regular lower body clothing?: A Lot 6 Click Score: 17  End of Session Equipment Utilized During Treatment: Gait belt;Rolling walker Nurse Communication: Mobility status;Precautions  Activity Tolerance: Patient tolerated treatment well Patient left: in bed;with call bell/phone within reach;with nursing/sitter in room  OT Visit Diagnosis: Unsteadiness on feet (R26.81);Other abnormalities of gait and mobility (R26.89);Pain Pain - part of body: (back)                Time: 4481-8563 OT Time  Calculation (min): 33 min Charges:  OT General Charges $OT Visit: 1 Visit OT Evaluation $OT Eval Moderate Complexity: 1 Mod OT Treatments $Self Care/Home Management : 8-22 mins  Hulda Humphrey OTR/L Acute Rehabilitation Services Pager: [phone] Office: [place] 07/04/2018, 4:51 PM

## 2018-07-04 NOTE — Evaluation (Signed)
Physical Therapy Evaluation Patient Details Name: Katherine Delgado MRN: [id number] DOB: 03-May-1941 Today's Date: 07/04/2018   History of Present Illness  77 y.o. female admitted on 07/03/18 for elective L3-5 lumbar leaminectomies.  Pt with significant PMH of DM, thyroid disease, RA, osteoporosis, morbid obesity, HTN, Guillian Barre syndrome, fibromyalgia, COPD, bil knee surgery, back surgery ([id number]).    Clinical Impression  Pt was able to get OOB to recliner chair with min to mod assist and RW.  She was quite sedentary PTA, using WC mostly for in home mobility and RW at times.  She seems to have a supportive family and assist needed to be able to d/c home with home therapy follow up.   PT to follow acutely for deficits listed below.      Follow Up Recommendations Home health PT;Supervision for mobility/OOB    Equipment Recommendations  None recommended by PT    Recommendations for Other Services   NA    Precautions / Restrictions Precautions Precautions: Fall;Back Precaution Booklet Issued: Yes (comment) Precaution Comments: back precaution handout given and reviewed with pt.  Required Braces or Orthoses: (none)      Mobility  Bed Mobility               General bed mobility comments: Pt seated EOB.   Transfers Overall transfer level: Needs assistance Equipment used: Rolling walker (2 wheeled) Transfers: Sit to/from [facility] Sit to Stand: Mod assist Stand pivot transfers: Min assist       General transfer comment: Mod assist to stand from low bed, verbal cues for safe hand placement, min assist once up to take pivotal steps to the recliner.  She reports she has been walking to the bathroom assisted, but did not want to try walking to the hallway today, "let's do that tomorrow"  Ambulation/Gait Ambulation/Gait assistance: Min assist Gait Distance (Feet): 3 Feet Assistive device: Rolling walker (2 wheeled) Gait Pattern/deviations: Step-through  pattern;Antalgic(favoring right leg)     General Gait Details: Pt took pivotal steps from bed to the recliner chair, min assist once up to stabilize trunk for balance.  RW adjusted down to better fit her height.  Will need to be re-checked as I did not adjust it until she was in the recliner chair.          Balance Overall balance assessment: Needs assistance Sitting-balance support: Feet supported;No upper extremity supported Sitting balance-Leahy Scale: Good     Standing balance support: Bilateral upper extremity supported Standing balance-Leahy Scale: Poor Standing balance comment: needs RW and external assist.                              Pertinent Vitals/Pain Pain Assessment: Faces Faces Pain Scale: Hurts even more Pain Location: back and right leg Pain Descriptors / Indicators: Aching;Burning Pain Intervention(s): Limited activity within patient's tolerance;Monitored during session;Repositioned    Home Living Family/patient expects to be discharged to:: Private residence Living Arrangements: Children;Other (Comment)(son) Available Help at Discharge: Family;Available 24 hours/day Type of Home: House Home Access: Stairs to enter Entrance Stairs-Rails: None Entrance Stairs-Number of Steps: 4 Home Layout: One level Home Equipment: Walker - 2 wheels;Cane - single point;Shower seat;Bedside commode;Grab bars - tub/shower;Hand held shower head      Prior Function Level of Independence: Independent with assistive device(s);Needs assistance   Gait / Transfers Assistance Needed: uses RW vs WC depending on how she is feeling that day.   ADL's / Homemaking  Assistance Needed: son makes sure she gets into and out of the shower ok, but she does her washing.           Extremity/Trunk Assessment   Upper Extremity Assessment Upper Extremity Assessment: Defer to OT evaluation    Lower Extremity Assessment Lower Extremity Assessment: Generalized weakness     Cervical / Trunk Assessment Cervical / Trunk Assessment: Other exceptions Cervical / Trunk Exceptions: this is her second back surgery per char.  Communication   Communication: HOH  Cognition Arousal/Alertness: Awake/alert Behavior During Therapy: WFL for tasks assessed/performed Overall Cognitive Status: Within Functional Limits for tasks assessed                                               Assessment/Plan    PT Assessment Patient needs continued PT services  PT Problem List Decreased strength;Decreased range of motion;Decreased balance;Decreased activity tolerance;Decreased mobility;Decreased knowledge of use of DME;Decreased knowledge of precautions;Obesity;Pain       PT Treatment Interventions DME instruction;Gait training;Stair training;Functional mobility training;Therapeutic activities;Therapeutic exercise;Balance training;Patient/family education    PT Goals (Current goals can be found in the Care Plan section)  Acute Rehab PT Goals Patient Stated Goal: to go home at d/c PT Goal Formulation: With patient Time For Goal Achievement: 07/18/18 Potential to Achieve Goals: Good    Frequency Min 5X/week           AM-PAC PT "6 Clicks" Daily Activity  Outcome Measure Difficulty turning over in bed (including adjusting bedclothes, sheets and blankets)?: Unable Difficulty moving from lying on back to sitting on the side of the bed? : Unable Difficulty sitting down on and standing up from a chair with arms (e.g., wheelchair, bedside commode, etc,.)?: Unable Help needed moving to and from a bed to chair (including a wheelchair)?: A Little Help needed walking in hospital room?: A Little Help needed climbing 3-5 steps with a railing? : A Lot 6 Click Score: 11    End of Session Equipment Utilized During Treatment: Gait belt Activity Tolerance: Patient limited by pain Patient left: in chair;with call bell/phone within reach;with chair alarm set   PT  Visit Diagnosis: Difficulty in walking, not elsewhere classified (R26.2);Muscle weakness (generalized) (M62.81);Pain;Other symptoms and signs involving the nervous system (R29.898) Pain - Right/Left: Right(lower) Pain - part of body: Leg(back)    Time: 5176-1607 PT Time Calculation (min) (ACUTE ONLY): 24 min   Charges:          Wells Guiles B. Regana Kemple, PT, DPT  Acute Rehabilitation #[phone] pager #[phone] office   PT Evaluation $PT Eval Moderate Complexity: 1 Mod PT Treatments $Therapeutic Activity: 8-22 mins

## 2018-07-05 LAB — GLUCOSE, CAPILLARY
GLUCOSE-CAPILLARY: 135 mg/dL — AB (ref 70–99)
Glucose-Capillary: 117 mg/dL — ABNORMAL HIGH (ref 70–99)
Glucose-Capillary: 107 mg/dL — ABNORMAL HIGH (ref 70–99)
Glucose-Capillary: 68 mg/dL — ABNORMAL LOW (ref 70–99)
Glucose-Capillary: 138 mg/dL — ABNORMAL HIGH (ref 70–99)

## 2018-07-05 NOTE — Progress Notes (Signed)
Occupational Therapy Treatment Patient Details Name: Katherine Delgado MRN: [id number] DOB: [birth year] Today's Date: 07/05/2018    History of present illness 77 y.o. female admitted on 07/03/18 for elective L3-5 lumbar leaminectomies.  Pt with significant PMH of DM, thyroid disease, RA, osteoporosis, morbid obesity, HTN, Guillian Barre syndrome, fibromyalgia, COPD, bil knee surgery, back surgery ([id number]).     OT comments  Session limited by lethargy. On arrival pt asleep in chair; attempted to wake pt. Pt appeared lethargic and confused. BP 73/38. Nsg called. Pt unable to stand and required use of Stedy to return to bed. Once supine, BP 69/51. Nsg with pt at end of session and level of arousal beginning to improve. At this time recommend follow up with [place].   Follow Up Recommendations  Home health OT;Supervision/Assistance - 24 hour    Equipment Recommendations  None recommended by OT    Recommendations for Other Services      Precautions / Restrictions Precautions Precautions: Fall;Back Precaution Booklet Issued: Yes (comment) Precaution Comments: back precaution handout given and reviewed with pt.  Required Braces or Orthoses: (none)       Mobility Bed Mobility Overal bed mobility: Needs Assistance Bed Mobility: Sit to Sidelying Rolling: Min assist Sidelying to sit: Min assist     Sit to sidelying: Mod assist;+2 for physical assistance General bed mobility comments: Min assist to support trunk during transitions, verbal cues for log roll technique for back protection.   Transfers Overall transfer level: Needs assistance Equipment used: Rolling walker (2 wheeled) Transfers: Sit to/from [facility] Sit to Stand: Min assist         General transfer comment: Stedy +2 Max A    Balance Overall balance assessment: Needs assistance Sitting-balance support: Feet supported;No upper extremity supported Sitting balance-Leahy Scale: Poor     Standing balance  support: (A to maintain upright posture) Standing balance-Leahy Scale: Poor Standing balance comment: needs RW and external assist.                            ADL either performed or assessed with clinical judgement   ADL                                         General ADL Comments: Max A +2 with Stedy due to lethargy back to bed; unable to educate on AE due to level of arousal     Vision       Perception     Praxis      Cognition Arousal/Alertness: Lethargic;Suspect due to medications Behavior During Therapy: Flat affect Overall Cognitive Status: Impaired/Different from baseline Area of Impairment: Orientation;Attention;Memory;Following commands;Safety/judgement;Awareness;Problem solving                 Orientation Level: Disoriented to;Time;Situation Current Attention Level: Focused Memory: Decreased recall of precautions;Decreased short-term memory Following Commands: Follows one step commands inconsistently Safety/Judgement: Decreased awareness of safety;Decreased awareness of deficits Awareness: Intellectual Problem Solving: Slow processing;Decreased initiation          Exercises     Shoulder Instructions       General Comments I discussed going home vs SNF for rehab and pt's preference is home.  She justifies that she has a WC and is not far from her normal baseline level of moving.     Pertinent Vitals/ Pain  Pain Assessment: Faces Faces Pain Scale: Hurts little more Pain Location: back and right leg Pain Descriptors / Indicators: Discomfort;Grimacing Pain Intervention(s): Limited activity within patient's tolerance  Home Living                                          Prior Functioning/Environment              Frequency  Min 3X/week        Progress Toward Goals  OT Goals(current goals can now be found in the care plan section)  Progress towards OT goals: Not progressing toward  goals - comment(due to medical status)  Acute Rehab OT Goals Patient Stated Goal: to go home OT Goal Formulation: Patient unable to participate in goal setting Time For Goal Achievement: 07/18/18 Potential to Achieve Goals: Good ADL Goals Pt Will Perform Lower Body Bathing: with supervision;with adaptive equipment;sit to/from stand Pt Will Perform Lower Body Dressing: with min assist;with caregiver independent in assisting;with adaptive equipment;sit to/from stand Pt Will Transfer to Toilet: ambulating;with supervision Pt Will Perform Toileting - Clothing Manipulation and hygiene: with mod assist;with adaptive equipment;sit to/from stand Additional ADL Goal #1: Pt will perform bed mobiilty at supervision level (demonstrating log roll technique) prior to engaging in ADL activity  Plan Discharge plan needs to be updated;Frequency needs to be updated    Co-evaluation                 AM-PAC PT "6 Clicks" Daily Activity     Outcome Measure   Help from another person eating meals?: A Little Help from another person taking care of personal grooming?: A Little Help from another person toileting, which includes using toliet, bedpan, or urinal?: Total Help from another person bathing (including washing, rinsing, drying)?: Total Help from another person to put on and taking off regular upper body clothing?: Total Help from another person to put on and taking off regular lower body clothing?: Total 6 Click Score: 10    End of Session Equipment Utilized During Treatment: Gait belt  OT Visit Diagnosis: Unsteadiness on feet (R26.81);Other abnormalities of gait and mobility (R26.89);Pain Pain - part of body: (back)   Activity Tolerance Patient limited by lethargy;Treatment limited secondary to medical complications (Comment)(drop in BP)   Patient Left in bed;with call bell/phone within reach;with bed alarm set;with nursing/sitter in room   Nurse Communication Mobility status;Other  (comment)(medical status)        Time: 1050(1133)-1120 OT Time Calculation (min): 30 min  Charges: OT General Charges $OT Visit: 1 Visit OT Treatments $Self Care/Home Management : 8-22 mins  Maurie Boettcher, OT/L   Acute OT Clinical Specialist [place] Pager [phone] Office [phone]    [facility] 07/05/2018, 12:07 PM

## 2018-07-05 NOTE — Progress Notes (Addendum)
Neurosurgery Service Progress Note  Subjective: No acute events overnight, pain well controlled, some L thigh dysesthesias and numbness  Objective: Vitals:   07/04/18 1530 07/04/18 2000 07/04/18 2300 07/05/18 0400  BP: (!) 109/41 (!) 132/50 (!) 103/58 (!) 142/55  Pulse: 71     Resp:      Temp: 97.7 F (36.5 C) 98.5 F (36.9 C) 98.6 F (37 C) 98.4 F (36.9 C)  TempSrc: Oral Oral Oral Oral  SpO2: 98%     Weight:      Height:       Temp (24hrs), Avg:98.1 F (36.7 C), Min:97.7 F (36.5 C), Max:98.6 F (37 C)  CBC Latest Ref Rng & Units 07/03/2018 04/06/2018 03/06/2018  WBC 4.0 - 10.5 K/uL 14.7(H) 14.5(H) 16.6(H)  Hemoglobin 12.0 - 15.0 g/dL 13.2 13.2 13.7  Hematocrit 36.0 - 46.0 % 44.2 39.7 42.1  Platelets 150 - 400 K/uL 348 346 307   BMP Latest Ref Rng & Units 07/03/2018 04/06/2018 01/22/2018  Glucose 70 - 99 mg/dL 194(H) 102(H) 198(H)  BUN 8 - 23 mg/dL 23 18 14   Creatinine 0.44 - 1.00 mg/dL 0.98 0.93 0.97(H)  BUN/Creat Ratio 6 - 22 (calc) - NOT APPLICABLE 14  Sodium 017 - 145 mmol/L 137 141 139  Potassium 3.5 - 5.1 mmol/L 3.9 4.8 4.6  Chloride 98 - 111 mmol/L 100 103 101  CO2 22 - 32 mmol/L 29 31 30   Calcium 8.9 - 10.3 mg/dL 9.4 9.1 9.7    Intake/Output Summary (Last 24 hours) at 07/05/2018 0752 Last data filed at 07/05/2018 0600 Gross per 24 hour  Intake 240 ml  Output 800 ml  Net -560 ml    Current Facility-Administered Medications:  .  0.9 %  sodium chloride infusion, 250 mL, Intravenous, Continuous, Rayelynn Loyal, Joyice Faster, MD, Last Rate: 1 mL/hr at 07/03/18 1821, 250 mL at 07/03/18 1821 .  acetaminophen (TYLENOL) tablet 650 mg, 650 mg, Oral, Q4H PRN **OR** acetaminophen (TYLENOL) suppository 650 mg, 650 mg, Rectal, Q4H PRN, Airyn Ellzey A, MD .  albuterol (PROVENTIL) (2.5 MG/3ML) 0.083% nebulizer solution 3 mL, 3 mL, Inhalation, Q6H PRN, Judith Part, MD .  amLODipine (NORVASC) tablet 10 mg, 10 mg, Oral, Daily, Judith Part, MD, 10 mg at 07/04/18  0856 .  cyclobenzaprine (FLEXERIL) tablet 10 mg, 10 mg, Oral, TID PRN, Judith Part, MD, 10 mg at 07/04/18 1129 .  docusate sodium (COLACE) capsule 100 mg, 100 mg, Oral, BID, Judith Part, MD, 100 mg at 07/04/18 2202 .  gabapentin (NEURONTIN) tablet 600 mg, 600 mg, Oral, QPM, Genesee Nase A, MD, 600 mg at 07/04/18 1800 .  heparin injection 5,000 Units, 5,000 Units, Subcutaneous, Q8H, Judith Part, MD, 5,000 Units at 07/05/18 [phone] .  HYDROmorphone (DILAUDID) injection 0.5 mg, 0.5 mg, Intravenous, Q2H PRN, Judith Part, MD, 0.5 mg at 07/04/18 1129 .  insulin aspart (novoLOG) injection 0-15 Units, 0-15 Units, Subcutaneous, TID WC, Judith Part, MD, 3 Units at 07/04/18 1156 .  insulin aspart (novoLOG) injection 10 Units, 10 Units, Subcutaneous, TID WC, Judith Part, MD, 10 Units at 07/04/18 1801 .  insulin glargine (LANTUS) injection 100 Units, 100 Units, Subcutaneous, QHS, Judith Part, MD, 100 Units at 07/04/18 2201 .  levothyroxine (SYNTHROID, LEVOTHROID) tablet 137 mcg, 137 mcg, Oral, QAC breakfast, Judith Part, MD, 137 mcg at 07/04/18 0854 .  lisinopril (PRINIVIL,ZESTRIL) tablet 20 mg, 20 mg, Oral, Daily, Judith Part, MD, 20 mg at 07/04/18 0856 .  Melatonin  TABS 9 mg, 9 mg, Oral, QHS, Rohen Kimes, Joyice Faster, MD, 9 mg at 07/04/18 2202 .  menthol-cetylpyridinium (CEPACOL) lozenge 3 mg, 1 lozenge, Oral, PRN **OR** phenol (CHLORASEPTIC) mouth spray 1 spray, 1 spray, Mouth/Throat, PRN, Jordani Nunn A, MD .  metoprolol tartrate (LOPRESSOR) tablet 50 mg, 50 mg, Oral, BID, Judith Part, MD, 50 mg at 07/04/18 2202 .  mirabegron ER (MYRBETRIQ) tablet 50 mg, 50 mg, Oral, Daily, Judith Part, MD, 50 mg at 07/04/18 0854 .  nystatin (MYCOSTATIN/NYSTOP) topical powder 1 g, 1 g, Topical, Daily PRN, Judith Part, MD .  nystatin cream (MYCOSTATIN) 1 application, 1 application, Topical, BID PRN, Judith Part, MD .   ondansetron (ZOFRAN) tablet 4 mg, 4 mg, Oral, Q6H PRN **OR** ondansetron (ZOFRAN) injection 4 mg, 4 mg, Intravenous, Q6H PRN, Judith Part, MD .  oxyCODONE (Oxy IR/ROXICODONE) immediate release tablet 10 mg, 10 mg, Oral, Q4H PRN, Judith Part, MD, 10 mg at 07/05/18 0604 .  oxyCODONE (Oxy IR/ROXICODONE) immediate release tablet 5 mg, 5 mg, Oral, Q4H PRN, Danisha Brassfield A, MD .  polyethylene glycol (MIRALAX / GLYCOLAX) packet 17 g, 17 g, Oral, Daily PRN, Judith Part, MD, 17 g at 07/04/18 1030 .  polyvinyl alcohol (LIQUIFILM TEARS) 1.4 % ophthalmic solution 1 drop, 1 drop, Both Eyes, QID PRN, Judith Part, MD .  pregabalin (LYRICA) capsule 150 mg, 150 mg, Oral, BID, Judith Part, MD, 150 mg at 07/04/18 2202 .  sodium chloride flush (NS) 0.9 % injection 3 mL, 3 mL, Intravenous, Q12H, Nishtha Raider, Joyice Faster, MD, 3 mL at 07/04/18 2204 .  sodium chloride flush (NS) 0.9 % injection 3 mL, 3 mL, Intravenous, PRN, Judith Part, MD .  tiZANidine (ZANAFLEX) tablet 2 mg, 2 mg, Oral, TID, Judith Part, MD, 2 mg at 07/04/18 2202 .  triamterene-hydrochlorothiazide (MAXZIDE-25) 37.5-25 MG per tablet 1 tablet, 1 tablet, Oral, Daily, Judith Part, MD, 1 tablet at 07/04/18 0856 .  umeclidinium-vilanterol (ANORO ELLIPTA) 62.5-25 MCG/INH 1 puff, 1 puff, Inhalation, Daily, Judith Part, MD, 1 puff at 07/04/18 [phone] .  venlafaxine XR (EFFEXOR-XR) 24 hr capsule 75 mg, 75 mg, Oral, Q breakfast, Yoko Mcgahee, Joyice Faster, MD, 75 mg at 07/04/18 0856   Physical Exam: AOx3, PERRL, EOMI, FS, TM, Strength 5/5 x4, SILTx4 L anterior thigh numbness and parasthesia  Assessment & Plan: 77 y.o. woman s/p L3-5 laminectomies, recovering well.  -CDI: Pt's BMI is >40, consistent with morbid obesity -PT/OT rec home PT -CBG improved, cont regimen -keep wound open to air, dermabond in place -L thigh pain is c/w meralgia parasthetica, already on a good dose of pregabalin, will  resolve -SCDs/TEDs, start SQH -likely discharge tomorrow   Judith Part  07/05/18 7:52 AM

## 2018-07-05 NOTE — Progress Notes (Signed)
Physical Therapy Treatment Patient Details Name: Katherine Delgado MRN: [id number] DOB: [birth year] Today's Date: 07/05/2018    History of Present Illness 77 y.o. female admitted on 07/03/18 for elective L3-5 lumbar leaminectomies.  Pt with significant PMH of DM, thyroid disease, RA, osteoporosis, morbid obesity, HTN, Guillian Barre syndrome, fibromyalgia, COPD, bil knee surgery, back surgery ([id number]).      PT Comments    Pt progressed gait into the hallway with RW.  Chair followed to encourage increased gait distance and safety.  Pt reports she is close to her mobility baseline and would continue to prefer to go home with her husband's assist and home therapy (if team agreeable with this plan).  PT will continue to follow acutely for safe mobility progression   Follow Up Recommendations  Home health PT;Supervision for mobility/OOB     Equipment Recommendations  None recommended by PT    Recommendations for Other Services   NA     Precautions / Restrictions Precautions Precautions: Fall;Back Precaution Booklet Issued: Yes (comment) Precaution Comments: back precaution handout given and reviewed with pt.  Required Braces or Orthoses: (none)    Mobility  Bed Mobility Overal bed mobility: Needs Assistance Bed Mobility: Rolling;Sidelying to Sit Rolling: Min assist Sidelying to sit: Min assist       General bed mobility comments: Min assist to support trunk during transitions, verbal cues for log roll technique for back protection.   Transfers Overall transfer level: Needs assistance Equipment used: Rolling walker (2 wheeled) Transfers: Sit to/from [facility] Sit to Stand: Min assist         General transfer comment: Min assist to come to standing today from lower chair.    Ambulation/Gait Ambulation/Gait assistance: Min assist;+2 safety/equipment Gait Distance (Feet): 40 Feet Assistive device: Rolling walker (2 wheeled) Gait Pattern/deviations:  Step-through pattern;Antalgic;Shuffle;Trunk flexed(favoring right leg) Gait velocity: decreased Gait velocity interpretation: 1.31 - 2.62 ft/sec, indicative of limited community ambulator General Gait Details: Pt pushes RW too far out in front of her and has poor foot clearance.  She says when she gets closer to the RW and stainds tall, her right leg hurts more.  Chair to follow to encourage increased gait distance and safety          Balance Overall balance assessment: Needs assistance Sitting-balance support: Feet supported;No upper extremity supported Sitting balance-Leahy Scale: Good     Standing balance support: Bilateral upper extremity supported Standing balance-Leahy Scale: Poor Standing balance comment: needs RW and external assist.                             Cognition Arousal/Alertness: Awake/alert Behavior During Therapy: WFL for tasks assessed/performed Overall Cognitive Status: Within Functional Limits for tasks assessed                                           General Comments General comments (skin integrity, edema, etc.): I discussed going home vs SNF for rehab and pt's preference is home.  She justifies that she has a WC and is not far from her normal baseline level of moving.       Pertinent Vitals/Pain Pain Assessment: Faces Faces Pain Scale: Hurts even more Pain Location: back and right leg Pain Descriptors / Indicators: Aching;Burning Pain Intervention(s): Limited activity within patient's tolerance;Monitored during session;Repositioned  PT Goals (current goals can now be found in the care plan section) Acute Rehab PT Goals Patient Stated Goal: to go home at d/c Progress towards PT goals: Progressing toward goals    Frequency    Min 5X/week      PT Plan Current plan remains appropriate       AM-PAC PT "6 Clicks" Daily Activity  Outcome Measure  Difficulty turning over in bed (including adjusting  bedclothes, sheets and blankets)?: Unable Difficulty moving from lying on back to sitting on the side of the bed? : Unable Difficulty sitting down on and standing up from a chair with arms (e.g., wheelchair, bedside commode, etc,.)?: Unable Help needed moving to and from a bed to chair (including a wheelchair)?: A Little Help needed walking in hospital room?: A Little Help needed climbing 3-5 steps with a railing? : A Lot 6 Click Score: 11    End of Session Equipment Utilized During Treatment: Gait belt Activity Tolerance: Patient limited by pain Patient left: in chair;with call bell/phone within reach;with chair alarm set   PT Visit Diagnosis: Difficulty in walking, not elsewhere classified (R26.2);Muscle weakness (generalized) (M62.81);Pain;Other symptoms and signs involving the nervous system (R29.898) Pain - Right/Left: Right(lower) Pain - part of body: Leg(back)     Time: 6579-0383 PT Time Calculation (min) (ACUTE ONLY): 19 min  Charges:  $Gait Training: 8-22 mins                    Dashawna Delbridge B. Zea Kostka, PT, DPT  Acute Rehabilitation [phone] pager #[phone] office   07/05/2018, 10:28 AM

## 2018-07-05 NOTE — Progress Notes (Signed)
Pt appeared lethargy and confused. BP was 73/41 map of 51. Pt was assisted back to bed. Pt received 165ml NS bolus. Recheck bp was 101/47.  Pt level of consciousness improved after bp increased. Pt is alert and oriented x4 now. MD made aware. Norvasc was discontinued. Will continue to monitor pt.

## 2018-07-06 LAB — GLUCOSE, CAPILLARY
GLUCOSE-CAPILLARY: 127 mg/dL — AB (ref 70–99)
GLUCOSE-CAPILLARY: 51 mg/dL — AB (ref 70–99)
GLUCOSE-CAPILLARY: 161 mg/dL — AB (ref 70–99)
Glucose-Capillary: 97 mg/dL (ref 70–99)

## 2018-07-06 MED ORDER — GLUCOSE 40 % PO GEL
ORAL | Status: AC
  Administered 2018-07-06: 18:00:00
  Filled 2018-07-06: qty 1

## 2018-07-06 MED ORDER — GLUCOSE 40 % PO GEL: 1.0000 | Freq: Once | ORAL | Status: AC

## 2018-07-06 NOTE — Progress Notes (Signed)
Physical Therapy Treatment Patient Details Name: Katherine Delgado MRN: [id number] DOB: [birth year] Today's Date: 07/06/2018    History of Present Illness 77 y.o. female admitted on 07/03/18 for elective L3-5 lumbar leaminectomies.  Pt with significant PMH of DM, thyroid disease, RA, osteoporosis, morbid obesity, HTN, Guillian Barre syndrome, fibromyalgia, COPD, bil knee surgery, back surgery ([id number]).      PT Comments    Pt reports she had a rough night with her blood sugars and pain management.  She is fatigued today and had to take one sitting rest break during gait, but was able to get up and go again with RW and one person physical assist (second person following with chair for safety and to encourage gait progression).  Pt will need to practice stairs prior to d/c home and this may be safer in our 5N ortho gym as there are two hand rails that she can reach at the same time (she reports her porch has handrails that come off of both sides).  PT will continue to follow acutely for safe mobility progression  Follow Up Recommendations  Home health PT;Supervision for mobility/OOB     Equipment Recommendations  None recommended by PT    Recommendations for Other Services   NA     Precautions / Restrictions Precautions Precautions: Fall;Back    Mobility  Bed Mobility               General bed mobility comments: Pt is OOB in the recliner chair.   Transfers Overall transfer level: Needs assistance Equipment used: Rolling walker (2 wheeled) Transfers: Sit to/from Stand Sit to Stand: Min assist         General transfer comment: Min assist to stand from recliner chair x 2.  Assist to power up to stand, verbal cues for safe hand placement.   Ambulation/Gait Ambulation/Gait assistance: Min assist;+2 safety/equipment Gait Distance (Feet): (20x1, 40'x1 with seated rest break in between) Assistive device: Rolling walker (2 wheeled) Gait Pattern/deviations: Step-through pattern;Trunk  flexed Gait velocity: decreased Gait velocity interpretation: 1.31 - 2.62 ft/sec, indicative of limited community ambulator General Gait Details: Pt with very flexed trunk, assist needed to keep RW close to her and chair to follow to encourage increased gait distance.  She started and had to sit before exiting the room, but after sitting, she was able to make it to where she walked yesterday.  She reports her son usually uses the [facility] to get her from the car to the steps and then she uses bil rails to get up the steps at home.  We will need to practice some steps prior to d/c and it may be safer on the practice stairs on 5N as they have handrails that she can reach on both sides.           Balance Overall balance assessment: Needs assistance Sitting-balance support: Feet supported;Bilateral upper extremity supported Sitting balance-Leahy Scale: Fair     Standing balance support: Bilateral upper extremity supported Standing balance-Leahy Scale: Poor Standing balance comment: needs support from RW and therapist                            Cognition Arousal/Alertness: Awake/alert Behavior During Therapy: WFL for tasks assessed/performed Overall Cognitive Status: Within Functional Limits for tasks assessed  General Comments: Cognition appears WNL today.  Not specifically tested, but processing well, following multistep commands.  Conversation is normal.       Exercises General Exercises - Lower Extremity Ankle Circles/Pumps: AROM;Both;20 reps Long Arc Quad: AROM;Both;10 reps        Pertinent Vitals/Pain Pain Assessment: Faces Faces Pain Scale: Hurts even more Pain Location: back and right leg Pain Descriptors / Indicators: Discomfort;Grimacing Pain Intervention(s): Limited activity within patient's tolerance;Monitored during session;Repositioned        PT Goals (current goals can now be found in the care plan section)  Acute Rehab PT Goals Patient Stated Goal: to go home Progress towards PT goals: Progressing toward goals    Frequency    Min 5X/week      PT Plan Current plan remains appropriate       AM-PAC PT "6 Clicks" Daily Activity  Outcome Measure  Difficulty turning over in bed (including adjusting bedclothes, sheets and blankets)?: Unable Difficulty moving from lying on back to sitting on the side of the bed? : Unable Difficulty sitting down on and standing up from a chair with arms (e.g., wheelchair, bedside commode, etc,.)?: Unable Help needed moving to and from a bed to chair (including a wheelchair)?: A Little Help needed walking in hospital room?: A Little Help needed climbing 3-5 steps with a railing? : A Lot 6 Click Score: 11    End of Session Equipment Utilized During Treatment: Gait belt Activity Tolerance: Patient limited by fatigue;Patient limited by pain Patient left: in chair;with call bell/phone within reach;with chair alarm set   PT Visit Diagnosis: Difficulty in walking, not elsewhere classified (R26.2);Muscle weakness (generalized) (M62.81);Pain;Other symptoms and signs involving the nervous system (R29.898) Pain - Right/Left: Right(lower) Pain - part of body: Leg(back)     Time: 5284-1324 PT Time Calculation (min) (ACUTE ONLY): 14 min  Charges:  $Gait Training: 8-22 mins                    Elnore Cosens B. Avalyn Molino, PT, DPT  Acute Rehabilitation [phone] pager #[phone] office   07/06/2018, 12:36 PM

## 2018-07-06 NOTE — Care Management Important Message (Signed)
Important Message  Patient Details  Name: Katherine Delgado MRN: [id number] Date of Birth: [birth year]   Medicare Important Message Given:  Yes    Cyara Devoto Montine Circle 07/06/2018, 3:58 PM

## 2018-07-06 NOTE — Progress Notes (Addendum)
Occupational Therapy Treatment Patient Details Name: Katherine Delgado MRN: [id number] DOB: 12/10/1940 Today's Date: 07/06/2018    History of present illness 77 y.o. female admitted on 07/03/18 for elective L3-5 lumbar leaminectomies.  Pt with significant PMH of DM, thyroid disease, RA, osteoporosis, morbid obesity, HTN, Guillian Barre syndrome, fibromyalgia, COPD, bil knee surgery, back surgery ([id number]).     OT comments  Pt with slow progress towards OT goals, with increased fatigue this session. Pt wishing to attempt having BM in bathroom, use of Stedy for safe transfer to toilet with pt requiring fluctuating levels of assist for sit<>stand (modA+2 to maxA+1). Pt requiring maxA for toileting ADLs; initiated education regarding AE for LB ADLs, pt requiring mod-max cues for recall and to return demonstrate understanding of education. Pt also requiring continued cues to maintain precautions during functional tasks and mobility. Currently pt planning to return home with [facility] therapy services. Pending pt progress and given pt's current level of assist this session, may need to consider SNF to maximize her safety and independence with ADLs and mobility. Will continue to follow to progress pt towards established OT goals.   Follow Up Recommendations  Home health OT;Supervision/Assistance - 24 hour(vs SNF, pending pt progress)    Equipment Recommendations  None recommended by OT          Precautions / Restrictions Precautions Precautions: Fall;Back Precaution Comments: reviewed back precautions with pt Restrictions Weight Bearing Restrictions: No       Mobility Bed Mobility Overal bed mobility: Needs Assistance Bed Mobility: Sit to Sidelying;Rolling Rolling: Min assist       Sit to sidelying: Mod assist General bed mobility comments: assist for LEs onto EOB; verbal cues for log roll technique  Transfers Overall transfer level: Needs assistance Equipment used: Rolling walker (2  wheeled);Ambulation equipment used Transfers: Sit to/from Stand Sit to Stand: Mod assist;+2 physical assistance;+2 safety/equipment         General transfer comment: pt initially stood to RW with min-modA+2; pt unsteady and with difficulty maintaining standing balance therefore use of Stedy to transfer to toilet in bathroom for increased safety; pt requiring mod-maxA+1-2 for sit<>stand throughout transfer completion    Balance Overall balance assessment: Needs assistance Sitting-balance support: Feet supported;Bilateral upper extremity supported Sitting balance-Leahy Scale: Fair     Standing balance support: Bilateral upper extremity supported Standing balance-Leahy Scale: Poor Standing balance comment: needs external support                           ADL either performed or assessed with clinical judgement   ADL Overall ADL's : Needs assistance/impaired                     Lower Body Dressing: Maximal assistance;Sit to/from stand Lower Body Dressing Details (indicate cue type and reason): began education regarding AE for completing LB ADL including reacher and sock aide; pt requiring max cues to follow instruction for use of reacher, min-mod cues for use of sock aide, will benefit from continued review Toilet Transfer: +2 for physical assistance;+2 for safety/equipment Toilet Transfer Details (indicate cue type and reason): use of Stedy to transfer onto toilet in bathroom this session to attempt having BM (BSC over toilet); initially attempted sit<>stand to ambulate into bathroom as NT availabe for +2 assist, however pt with significant fatigue and unable to maintain static standing balance for extended period of time; deferred mobility attempts due to safety concerns with ambulating distance into bathroom;  required min-modA+2 for sit<>stand at [facility] from recliner, maxA+1 to stand from [facility] to [employer] and Hygiene: Maximal assistance;+2  for safety/equipment;+2 for physical assistance;Sit to/from stand Toileting - Clothing Manipulation Details (indicate cue type and reason): pt performing peri-care while seated on commode; requires assist for clothing management while second person assists with standing balance       General ADL Comments: pt initially not wearing O2 start of session, once returned to EOB rattached pulse-ox and O2 sats down to 80% (though very unsteady waveform), applied 2.5L O2 and O2 up to 100%; pt requires max cues for deep breathing techniques; pt also requiring cues for maintaining upright posture;following back precautions during session     Vision       Perception     Praxis      Cognition Arousal/Alertness: Awake/alert Behavior During Therapy: WFL for tasks assessed/performed Overall Cognitive Status: Impaired/Different from baseline Area of Impairment: Attention;Memory;Following commands;Safety/judgement;Awareness                   Current Attention Level: Sustained Memory: Decreased recall of precautions Following Commands: Follows one step commands inconsistently Safety/Judgement: Decreased awareness of safety;Decreased awareness of deficits   Problem Solving: Slow processing;Requires verbal cues;Requires tactile cues General Comments: pt requires up to max multimodal cues to process and follow commands during this session; cues for safety and adhering to back precautions; pt also appears slightly HOH which may add to pt's understanding/processing        Exercises Exercises: General Lower Extremity General Exercises - Lower Extremity Ankle Circles/Pumps: AROM;Both;20 reps Long Arc Quad: AROM;Both;10 reps   Shoulder Instructions       General Comments      Pertinent Vitals/ Pain       Pain Assessment: Faces Faces Pain Scale: Hurts little more Pain Location: back and right leg Pain Descriptors / Indicators: Discomfort;Grimacing Pain Intervention(s): Monitored during  session;Limited activity within patient's tolerance  Home Living                                          Prior Functioning/Environment              Frequency  Min 3X/week        Progress Toward Goals  OT Goals(current goals can now be found in the care plan section)  Progress towards OT goals: Progressing toward goals  Acute Rehab OT Goals Patient Stated Goal: to go home OT Goal Formulation: With patient Time For Goal Achievement: 07/18/18 Potential to Achieve Goals: Good ADL Goals Pt Will Perform Lower Body Bathing: with supervision;with adaptive equipment;sit to/from stand Pt Will Perform Lower Body Dressing: with min assist;with caregiver independent in assisting;with adaptive equipment;sit to/from stand Pt Will Transfer to Toilet: ambulating;with supervision Pt Will Perform Toileting - Clothing Manipulation and hygiene: with mod assist;with adaptive equipment;sit to/from stand Additional ADL Goal #1: Pt will perform bed mobiilty at supervision level (demonstrating log roll technique) prior to engaging in ADL activity  Plan Discharge plan needs to be updated;Frequency needs to be updated    Co-evaluation                 AM-PAC PT "6 Clicks" Daily Activity     Outcome Measure   Help from another person eating meals?: A Little Help from another person taking care of personal grooming?: A Little Help from another person toileting, which  includes using toliet, bedpan, or urinal?: A Lot Help from another person bathing (including washing, rinsing, drying)?: A Lot Help from another person to put on and taking off regular upper body clothing?: A Little Help from another person to put on and taking off regular lower body clothing?: A Lot 6 Click Score: 15    End of Session Equipment Utilized During Treatment: Gait belt;Rolling walker;Oxygen  OT Visit Diagnosis: Unsteadiness on feet (R26.81);Other abnormalities of gait and mobility  (R26.89);Pain   Activity Tolerance Patient limited by fatigue   Patient Left in bed;with call bell/phone within reach;with bed alarm set   Nurse Communication Mobility status        Time: 1333-1406 OT Time Calculation (min): 33 min  Charges: OT General Charges $OT Visit: 1 Visit OT Treatments $Self Care/Home Management : 23-37 mins  Lou Cal, [place] Pager [phone] Office [phone]    SHAYLYNNE LUNT 07/06/2018, 4:23 PM

## 2018-07-06 NOTE — Progress Notes (Signed)
Neurosurgery Service Progress Note  Subjective: No acute events overnight, pain controlled improved, some muscle spasms overnight but significantly better this morning  Objective: Vitals:   07/05/18 1550 07/05/18 2000 07/06/18 0000 07/06/18 0400  BP: (!) 127/111 99/86    Pulse:      Resp:      Temp: 98.9 F (37.2 C) 98.6 F (37 C) 98.2 F (36.8 C) 98.5 F (36.9 C)  TempSrc: Oral Oral Oral Oral  SpO2:      Weight:      Height:       Temp (24hrs), Avg:98.5 F (36.9 C), Min:98.2 F (36.8 C), Max:98.9 F (37.2 C)  CBC Latest Ref Rng & Units 07/03/2018 04/06/2018 03/06/2018  WBC 4.0 - 10.5 K/uL 14.7(H) 14.5(H) 16.6(H)  Hemoglobin 12.0 - 15.0 g/dL 13.2 13.2 13.7  Hematocrit 36.0 - 46.0 % 44.2 39.7 42.1  Platelets 150 - 400 K/uL 348 346 307   BMP Latest Ref Rng & Units 07/03/2018 04/06/2018 01/22/2018  Glucose 70 - 99 mg/dL 194(H) 102(H) 198(H)  BUN 8 - 23 mg/dL 23 18 14   Creatinine 0.44 - 1.00 mg/dL 0.98 0.93 0.97(H)  BUN/Creat Ratio 6 - 22 (calc) - NOT APPLICABLE 14  Sodium 700 - 145 mmol/L 137 141 139  Potassium 3.5 - 5.1 mmol/L 3.9 4.8 4.6  Chloride 98 - 111 mmol/L 100 103 101  CO2 22 - 32 mmol/L 29 31 30   Calcium 8.9 - 10.3 mg/dL 9.4 9.1 9.7    Intake/Output Summary (Last 24 hours) at 07/06/2018 0748 Last data filed at 07/05/2018 0840 Gross per 24 hour  Intake 240 ml  Output -  Net 240 ml    Current Facility-Administered Medications:  .  0.9 %  sodium chloride infusion, 250 mL, Intravenous, Continuous, Coleston Dirosa, Joyice Faster, MD, Last Rate: 1 mL/hr at 07/03/18 1821, 250 mL at 07/03/18 1821 .  acetaminophen (TYLENOL) tablet 650 mg, 650 mg, Oral, Q4H PRN **OR** acetaminophen (TYLENOL) suppository 650 mg, 650 mg, Rectal, Q4H PRN, Quentyn Kolbeck A, MD .  albuterol (PROVENTIL) (2.5 MG/3ML) 0.083% nebulizer solution 3 mL, 3 mL, Inhalation, Q6H PRN, Judith Part, MD .  cyclobenzaprine (FLEXERIL) tablet 10 mg, 10 mg, Oral, TID PRN, Judith Part, MD, 10 mg at  07/05/18 1925 .  docusate sodium (COLACE) capsule 100 mg, 100 mg, Oral, BID, Judith Part, MD, 100 mg at 07/05/18 2300 .  gabapentin (NEURONTIN) tablet 600 mg, 600 mg, Oral, QPM, Carols Clemence A, MD, 600 mg at 07/05/18 1858 .  heparin injection 5,000 Units, 5,000 Units, Subcutaneous, Q8H, Judith Part, MD, 5,000 Units at 07/06/18 [phone] .  HYDROmorphone (DILAUDID) injection 0.5 mg, 0.5 mg, Intravenous, Q2H PRN, Judith Part, MD, 0.5 mg at 07/04/18 1129 .  insulin aspart (novoLOG) injection 0-15 Units, 0-15 Units, Subcutaneous, TID WC, Judith Part, MD, 2 Units at 07/05/18 [phone] .  insulin aspart (novoLOG) injection 10 Units, 10 Units, Subcutaneous, TID WC, Judith Part, MD, 10 Units at 07/05/18 1209 .  insulin glargine (LANTUS) injection 100 Units, 100 Units, Subcutaneous, QHS, Judith Part, MD, 100 Units at 07/05/18 2259 .  levothyroxine (SYNTHROID, LEVOTHROID) tablet 137 mcg, 137 mcg, Oral, QAC breakfast, Judith Part, MD, 137 mcg at 07/05/18 0950 .  lisinopril (PRINIVIL,ZESTRIL) tablet 20 mg, 20 mg, Oral, Daily, Judith Part, MD, 20 mg at 07/05/18 0950 .  Melatonin TABS 9 mg, 9 mg, Oral, QHS, Sanda Dejoy A, MD, 9 mg at 07/05/18 2300 .  menthol-cetylpyridinium (CEPACOL) lozenge  3 mg, 1 lozenge, Oral, PRN **OR** phenol (CHLORASEPTIC) mouth spray 1 spray, 1 spray, Mouth/Throat, PRN, Jessicaann Overbaugh A, MD .  metoprolol tartrate (LOPRESSOR) tablet 50 mg, 50 mg, Oral, BID, Judith Part, MD, 50 mg at 07/05/18 2301 .  mirabegron ER (MYRBETRIQ) tablet 50 mg, 50 mg, Oral, Daily, Judith Part, MD, 50 mg at 07/05/18 0952 .  nystatin (MYCOSTATIN/NYSTOP) topical powder 1 g, 1 g, Topical, Daily PRN, Emmette Katt A, MD .  nystatin cream (MYCOSTATIN) 1 application, 1 application, Topical, BID PRN, Judith Part, MD, 1 application at 92/42/68 [phone] .  ondansetron (ZOFRAN) tablet 4 mg, 4 mg, Oral, Q6H PRN **OR** ondansetron  (ZOFRAN) injection 4 mg, 4 mg, Intravenous, Q6H PRN, Jamille Fisher A, MD .  oxyCODONE (Oxy IR/ROXICODONE) immediate release tablet 10 mg, 10 mg, Oral, Q4H PRN, Judith Part, MD, 10 mg at 07/05/18 1416 .  oxyCODONE (Oxy IR/ROXICODONE) immediate release tablet 5 mg, 5 mg, Oral, Q4H PRN, Judith Part, MD, 5 mg at 07/05/18 1924 .  polyethylene glycol (MIRALAX / GLYCOLAX) packet 17 g, 17 g, Oral, Daily PRN, Judith Part, MD, 17 g at 07/04/18 1030 .  polyvinyl alcohol (LIQUIFILM TEARS) 1.4 % ophthalmic solution 1 drop, 1 drop, Both Eyes, QID PRN, Yasha Tibbett A, MD .  pregabalin (LYRICA) capsule 150 mg, 150 mg, Oral, BID, Judith Part, MD, 150 mg at 07/05/18 2300 .  sodium chloride flush (NS) 0.9 % injection 3 mL, 3 mL, Intravenous, Q12H, Darrius Montano, Joyice Faster, MD, 3 mL at 07/05/18 2308 .  sodium chloride flush (NS) 0.9 % injection 3 mL, 3 mL, Intravenous, PRN, Kelisha Dall A, MD .  tiZANidine (ZANAFLEX) tablet 2 mg, 2 mg, Oral, TID, Sharise Lippy, Joyice Faster, MD, 2 mg at 07/05/18 2300 .  triamterene-hydrochlorothiazide (MAXZIDE-25) 37.5-25 MG per tablet 1 tablet, 1 tablet, Oral, Daily, Judith Part, MD, 1 tablet at 07/05/18 0951 .  umeclidinium-vilanterol (ANORO ELLIPTA) 62.5-25 MCG/INH 1 puff, 1 puff, Inhalation, Daily, Judith Part, MD, 1 puff at 07/05/18 0806 .  venlafaxine XR (EFFEXOR-XR) 24 hr capsule 75 mg, 75 mg, Oral, Q breakfast, Pavielle Biggar, Joyice Faster, MD, 75 mg at 07/05/18 6222   Physical Exam: AOx3, PERRL, EOMI, FS, TM, Strength 5/5 x4, SILTx4  Assessment & Plan: 77 y.o. woman s/p L3-5 laminectomies, recovering well.  -PT/OT rec home PT -ambulate today, discharge tomorrow -CBG improved, cont regimen -SCDs/TEDs, SQH  Judith Part  07/06/18 7:48 AM

## 2018-07-06 NOTE — Care Management Note (Signed)
Case Management Note  Patient Details  Name: Katherine Delgado MRN: [id number] Date of Birth: [birth year]  Subjective/Objective:  77 y.o. female admitted on 07/03/18 for elective L3-5 lumbar leaminectomies.  PTA, pt  independent with assistive devices, lives with son.                  Action/Plan: PT/OT recommending HH follow up.  MD: please leave orders for HHPT/OT with face to face documentation for Medicare.   Case Manager will be happy to arrange services.    Expected Discharge Date:                  Expected Discharge Plan:  [place]  In-House Referral:     Discharge planning Services  CM Consult  Post Acute Care Choice:    Choice offered to:     DME Arranged:    DME Agency:     HH Arranged:    [place] Agency:     Status of Service:  In process, will continue to follow  If discussed at Long Length of Stay Meetings, dates discussed:    Additional Comments:  Reinaldo Raddle, RN, BSN  Trauma/Neuro ICU Case Manager [phone]

## 2018-07-07 LAB — GLUCOSE, CAPILLARY
GLUCOSE-CAPILLARY: 153 mg/dL — AB (ref 70–99)
GLUCOSE-CAPILLARY: 132 mg/dL — AB (ref 70–99)
Glucose-Capillary: 100 mg/dL — ABNORMAL HIGH (ref 70–99)
Glucose-Capillary: 185 mg/dL — ABNORMAL HIGH (ref 70–99)

## 2018-07-07 NOTE — Plan of Care (Signed)
  Problem: Health Behavior/Discharge Planning: Goal: Ability to manage health-related needs will improve Outcome: Progressing   Problem: Clinical Measurements: Goal: Ability to maintain clinical measurements within normal limits will improve Outcome: Progressing Goal: Will remain free from infection Outcome: Progressing   

## 2018-07-07 NOTE — Progress Notes (Signed)
Physical Therapy Treatment Patient Details Name: Katherine Delgado MRN: [id number] DOB: May 17, 1941 Today's Date: 07/07/2018    History of Present Illness 77 y.o. female admitted on 07/03/18 for elective L3-5 lumbar leaminectomies.  Pt with significant PMH of DM, thyroid disease, RA, osteoporosis, morbid obesity, HTN, Guillian Barre syndrome, fibromyalgia, COPD, bil knee surgery, back surgery ([id number]).      PT Comments    Pt with very limited mobility this session only able to walk 15', sitting unexpectedly with repeated trials of ambulation, unable to perform stairs. Pt has 4 stairs to enter home and currently unable to even attempt stairs with significant assist for mobility and very limited gait. Pt would greatly benefit from ST-SNF for pt and caregiver safety. Please see gait section for detail of session. RN aware of all and recommend Stedy for safe transfers currently.    Follow Up Recommendations  SNF;Supervision/Assistance - 24 hour     Equipment Recommendations  None recommended by PT    Recommendations for Other Services       Precautions / Restrictions Precautions Precautions: Fall;Back Precaution Comments: reviewed precautions with pt    Mobility  Bed Mobility Overal bed mobility: Needs Assistance Bed Mobility: Rolling;Sidelying to Sit Rolling: Min assist Sidelying to sit: Min assist       General bed mobility comments: reliance on rail with cues for sequence and assist to fully roll and elevate trunk from surface  Transfers Overall transfer level: Needs assistance   Transfers: Sit to/from Stand Sit to Stand: Min assist;Mod assist;+2 safety/equipment         General transfer comment: pt stood from bed with mod assist, min assist to stand from recliner. mod assist to stand with repeated trials from P.T. lap on chair, min assist to stand from chair  Ambulation/Gait Ambulation/Gait assistance: Min assist;+2 safety/equipment Gait Distance (Feet): 15  Feet Assistive device: Rolling walker (2 wheeled) Gait Pattern/deviations: Step-through pattern;Trunk flexed   Gait velocity interpretation: <1.8 ft/sec, indicate of risk for recurrent falls General Gait Details: pt with flexed trunk, self too posterior to walker, partial knee flexion with close chair follow. PT able to walk 15' to door of room then required sitting. Wheeled pt to stairwell in chair. Pt stood from chair, stepped 1 step toward stairs and initiated turning and grasping of rail. Pt then unable to step left foot forward toward rail or back to chair and began sitting. P.T. leg under pt and ultimately P.T. sat in chair and pt sit on P.T. lap in order to safely have pt sit. Chair moved closer to rail and pt able to stand with mod assist and then sit in chair for return to room. Attempted gait 1 more trial with pt able to stand, take one step and then began sitting with chair pulled under pt.    Stairs             Wheelchair Mobility    Modified Rankin (Stroke Patients Only)       Balance Overall balance assessment: Needs assistance Sitting-balance support: Feet supported;Bilateral upper extremity supported Sitting balance-Leahy Scale: Fair     Standing balance support: Bilateral upper extremity supported Standing balance-Leahy Scale: Poor                              Cognition Arousal/Alertness: Awake/alert Behavior During Therapy: Flat affect Overall Cognitive Status: Impaired/Different from baseline Area of Impairment: Attention;Memory;Following commands;Safety/judgement;Awareness  Current Attention Level: Sustained Memory: Decreased recall of precautions Following Commands: Follows one step commands inconsistently Safety/Judgement: Decreased awareness of safety;Decreased awareness of deficits   Problem Solving: Slow processing;Requires verbal cues;Requires tactile cues General Comments: pt with increased time for commands  and significantly impaired awareness of need for assist      Exercises      General Comments        Pertinent Vitals/Pain Faces Pain Scale: Hurts little more Pain Location: back Pain Descriptors / Indicators: Aching;Sore Pain Intervention(s): Limited activity within patient's tolerance;Repositioned;Monitored during session;Premedicated before session    Home Living                      Prior Function            PT Goals (current goals can now be found in the care plan section) Progress towards PT goals: Not progressing toward goals - comment    Frequency    Min 5X/week      PT Plan Discharge plan needs to be updated    Co-evaluation              AM-PAC PT "6 Clicks" Daily Activity  Outcome Measure  Difficulty turning over in bed (including adjusting bedclothes, sheets and blankets)?: Unable Difficulty moving from lying on back to sitting on the side of the bed? : Unable Difficulty sitting down on and standing up from a chair with arms (e.g., wheelchair, bedside commode, etc,.)?: Unable Help needed moving to and from a bed to chair (including a wheelchair)?: A Lot Help needed walking in hospital room?: A Lot Help needed climbing 3-5 steps with a railing? : Total 6 Click Score: 8    End of Session Equipment Utilized During Treatment: Gait belt Activity Tolerance: Patient limited by fatigue Patient left: in chair;with call bell/phone within reach;with chair alarm set Nurse Communication: Mobility status;Need for lift equipment(recommend Stedy) PT Visit Diagnosis: Difficulty in walking, not elsewhere classified (R26.2);Muscle weakness (generalized) (M62.81);Pain;Other symptoms and signs involving the nervous system (Q00.867)     Time: 6195-0932 PT Time Calculation (min) (ACUTE ONLY): 23 min  Charges:  $Gait Training: 8-22 mins $Therapeutic Activity: 8-22 mins                     [place], PT Acute Rehabilitation Services Pager:  [phone] Office: [place] 07/07/2018, 11:53 AM

## 2018-07-07 NOTE — Progress Notes (Signed)
Neurosurgery Service Progress Note  Subjective: No acute events overnight, pain well controlled, no radicular pain, but having difficulty ambulating with PT, feels her legs are diffusely weak   Objective: Vitals:   07/06/18 2300 07/07/18 0300 07/07/18 0810 07/07/18 0838  BP: (!) 82/71 119/63  (!) 148/76  Pulse: 65 69  94  Resp:      Temp: 99.6 F (37.6 C) 98.8 F (37.1 C)  99.3 F (37.4 C)  TempSrc: Oral Oral  Oral  SpO2: 100% 90% 91% 94%  Weight:      Height:       Temp (24hrs), Avg:98.8 F (37.1 C), Min:98.2 F (36.8 C), Max:99.6 F (37.6 C)  CBC Latest Ref Rng & Units 07/03/2018 04/06/2018 03/06/2018  WBC 4.0 - 10.5 K/uL 14.7(H) 14.5(H) 16.6(H)  Hemoglobin 12.0 - 15.0 g/dL 13.2 13.2 13.7  Hematocrit 36.0 - 46.0 % 44.2 39.7 42.1  Platelets 150 - 400 K/uL 348 346 307   BMP Latest Ref Rng & Units 07/03/2018 04/06/2018 01/22/2018  Glucose 70 - 99 mg/dL 194(H) 102(H) 198(H)  BUN 8 - 23 mg/dL 23 18 14   Creatinine 0.44 - 1.00 mg/dL 0.98 0.93 0.97(H)  BUN/Creat Ratio 6 - 22 (calc) - NOT APPLICABLE 14  Sodium 726 - 145 mmol/L 137 141 139  Potassium 3.5 - 5.1 mmol/L 3.9 4.8 4.6  Chloride 98 - 111 mmol/L 100 103 101  CO2 22 - 32 mmol/L 29 31 30   Calcium 8.9 - 10.3 mg/dL 9.4 9.1 9.7    Intake/Output Summary (Last 24 hours) at 07/07/2018 1209 Last data filed at 07/07/2018 0700 Gross per 24 hour  Intake 480 ml  Output -  Net 480 ml    Current Facility-Administered Medications:  .  0.9 %  sodium chloride infusion, 250 mL, Intravenous, Continuous, Latecia Miler A, MD, Last Rate: 1 mL/hr at 07/03/18 1821, 250 mL at 07/03/18 1821 .  acetaminophen (TYLENOL) tablet 650 mg, 650 mg, Oral, Q4H PRN, 650 mg at 07/07/18 0903 **OR** acetaminophen (TYLENOL) suppository 650 mg, 650 mg, Rectal, Q4H PRN, Aleysia Oltmann A, MD .  albuterol (PROVENTIL) (2.5 MG/3ML) 0.083% nebulizer solution 3 mL, 3 mL, Inhalation, Q6H PRN, Judith Part, MD .  cyclobenzaprine (FLEXERIL) tablet 10 mg,  10 mg, Oral, TID PRN, Judith Part, MD, 10 mg at 07/07/18 0205 .  docusate sodium (COLACE) capsule 100 mg, 100 mg, Oral, BID, Judith Part, MD, 100 mg at 07/07/18 0902 .  gabapentin (NEURONTIN) tablet 600 mg, 600 mg, Oral, QPM, Jayland Null A, MD, 600 mg at 07/06/18 1733 .  heparin injection 5,000 Units, 5,000 Units, Subcutaneous, Q8H, Judith Part, MD, 5,000 Units at 07/07/18 [phone]  HYDROmorphone (DILAUDID) injection 0.5 mg, 0.5 mg, Intravenous, Q2H PRN, Judith Part, MD, 0.5 mg at 07/04/18 1129 .  insulin aspart (novoLOG) injection 0-15 Units, 0-15 Units, Subcutaneous, TID WC, Deanthony Maull, Joyice Faster, MD, 0 Units at 07/06/18 1359 .  insulin aspart (novoLOG) injection 10 Units, 10 Units, Subcutaneous, TID WC, Latresa Gasser, Joyice Faster, MD, 10 Units at 07/06/18 1400 .  insulin glargine (LANTUS) injection 100 Units, 100 Units, Subcutaneous, QHS, Judith Part, MD, 100 Units at 07/06/18 2222 .  levothyroxine (SYNTHROID, LEVOTHROID) tablet 137 mcg, 137 mcg, Oral, QAC breakfast, Judith Part, MD, 137 mcg at 07/07/18 0908 .  lisinopril (PRINIVIL,ZESTRIL) tablet 20 mg, 20 mg, Oral, Daily, Judith Part, MD, 20 mg at 07/07/18 0903 .  Melatonin TABS 9 mg, 9 mg, Oral, QHS, Stanisha Lorenz, Joyice Faster, MD,  9 mg at 07/06/18 2222 .  menthol-cetylpyridinium (CEPACOL) lozenge 3 mg, 1 lozenge, Oral, PRN **OR** phenol (CHLORASEPTIC) mouth spray 1 spray, 1 spray, Mouth/Throat, PRN, Toi Stelly A, MD .  metoprolol tartrate (LOPRESSOR) tablet 50 mg, 50 mg, Oral, BID, Judith Part, MD, 50 mg at 07/07/18 0903 .  mirabegron ER (MYRBETRIQ) tablet 50 mg, 50 mg, Oral, Daily, Judith Part, MD, 50 mg at 07/07/18 0909 .  nystatin (MYCOSTATIN/NYSTOP) topical powder 1 g, 1 g, Topical, Daily PRN, Dalayza Zambrana A, MD .  nystatin cream (MYCOSTATIN) 1 application, 1 application, Topical, BID PRN, Judith Part, MD, 1 application at 78/29/56 [phone] .  ondansetron (ZOFRAN)  tablet 4 mg, 4 mg, Oral, Q6H PRN **OR** ondansetron (ZOFRAN) injection 4 mg, 4 mg, Intravenous, Q6H PRN, Man Effertz A, MD .  oxyCODONE (Oxy IR/ROXICODONE) immediate release tablet 10 mg, 10 mg, Oral, Q4H PRN, Judith Part, MD, 10 mg at 07/07/18 0903 .  oxyCODONE (Oxy IR/ROXICODONE) immediate release tablet 5 mg, 5 mg, Oral, Q4H PRN, Judith Part, MD, 5 mg at 07/05/18 1924 .  polyethylene glycol (MIRALAX / GLYCOLAX) packet 17 g, 17 g, Oral, Daily PRN, Judith Part, MD, 17 g at 07/04/18 1030 .  polyvinyl alcohol (LIQUIFILM TEARS) 1.4 % ophthalmic solution 1 drop, 1 drop, Both Eyes, QID PRN, Criston Chancellor A, MD .  pregabalin (LYRICA) capsule 150 mg, 150 mg, Oral, BID, Judith Part, MD, 150 mg at 07/07/18 0902 .  sodium chloride flush (NS) 0.9 % injection 3 mL, 3 mL, Intravenous, Q12H, Javaya Oregon, Joyice Faster, MD, 3 mL at 07/07/18 0905 .  sodium chloride flush (NS) 0.9 % injection 3 mL, 3 mL, Intravenous, PRN, Judith Part, MD .  tiZANidine (ZANAFLEX) tablet 2 mg, 2 mg, Oral, TID, Judith Part, MD, 2 mg at 07/07/18 0909 .  triamterene-hydrochlorothiazide (MAXZIDE-25) 37.5-25 MG per tablet 1 tablet, 1 tablet, Oral, Daily, Judith Part, MD, 1 tablet at 07/07/18 0909 .  umeclidinium-vilanterol (ANORO ELLIPTA) 62.5-25 MCG/INH 1 puff, 1 puff, Inhalation, Daily, Judith Part, MD, 1 puff at 07/07/18 0809 .  venlafaxine XR (EFFEXOR-XR) 24 hr capsule 75 mg, 75 mg, Oral, Q breakfast, Judith Part, MD, 75 mg at 07/07/18 8657   Physical Exam: Awake/alert, strength 5/5 x4, SILTx4  Assessment & Plan: 77 y.o. woman s/p lumbar laminectomies, recovering well. -PT/OT rec SNF, start placement -SCDs/TEDs, SQH  Judith Part  07/07/18 12:09 PM

## 2018-07-08 LAB — GLUCOSE, CAPILLARY
GLUCOSE-CAPILLARY: 72 mg/dL (ref 70–99)
GLUCOSE-CAPILLARY: 131 mg/dL — AB (ref 70–99)
Glucose-Capillary: 183 mg/dL — ABNORMAL HIGH (ref 70–99)
Glucose-Capillary: 98 mg/dL (ref 70–99)

## 2018-07-08 NOTE — Progress Notes (Signed)
Neurosurgery Service Progress Note  Subjective: No acute events overnight, strength improved today, left ant thigh pain resolved, still having some right ant thigh pain  Objective: Vitals:   07/08/18 0000 07/08/18 0300 07/08/18 0819 07/08/18 0917  BP: (!) 102/42 (!) 132/53 101/64   Pulse:  73 63   Resp:      Temp:  99.1 F (37.3 C) 98.3 F (36.8 C)   TempSrc:  Oral Oral   SpO2:  90% 92% 92%  Weight:      Height:       Temp (24hrs), Avg:98.7 F (37.1 C), Min:98.3 F (36.8 C), Max:99.1 F (37.3 C)  CBC Latest Ref Rng & Units 07/03/2018 04/06/2018 03/06/2018  WBC 4.0 - 10.5 K/uL 14.7(H) 14.5(H) 16.6(H)  Hemoglobin 12.0 - 15.0 g/dL 13.2 13.2 13.7  Hematocrit 36.0 - 46.0 % 44.2 39.7 42.1  Platelets 150 - 400 K/uL 348 346 307   BMP Latest Ref Rng & Units 07/03/2018 04/06/2018 01/22/2018  Glucose 70 - 99 mg/dL 194(H) 102(H) 198(H)  BUN 8 - 23 mg/dL 23 18 14   Creatinine 0.44 - 1.00 mg/dL 0.98 0.93 0.97(H)  BUN/Creat Ratio 6 - 22 (calc) - NOT APPLICABLE 14  Sodium 408 - 145 mmol/L 137 141 139  Potassium 3.5 - 5.1 mmol/L 3.9 4.8 4.6  Chloride 98 - 111 mmol/L 100 103 101  CO2 22 - 32 mmol/L 29 31 30   Calcium 8.9 - 10.3 mg/dL 9.4 9.1 9.7   No intake or output data in the 24 hours ending 07/08/18 0945  Current Facility-Administered Medications:  .  0.9 %  sodium chloride infusion, 250 mL, Intravenous, Continuous, Dequarius Jeffries A, MD, Last Rate: 1 mL/hr at 07/03/18 1821, 250 mL at 07/03/18 1821 .  acetaminophen (TYLENOL) tablet 650 mg, 650 mg, Oral, Q4H PRN, 650 mg at 07/07/18 0903 **OR** acetaminophen (TYLENOL) suppository 650 mg, 650 mg, Rectal, Q4H PRN, Khadeem Rockett A, MD .  albuterol (PROVENTIL) (2.5 MG/3ML) 0.083% nebulizer solution 3 mL, 3 mL, Inhalation, Q6H PRN, Judith Part, MD .  cyclobenzaprine (FLEXERIL) tablet 10 mg, 10 mg, Oral, TID PRN, Judith Part, MD, 10 mg at 07/08/18 0855 .  docusate sodium (COLACE) capsule 100 mg, 100 mg, Oral, BID, Lulabelle Desta,  Raja Caputi A, MD, 100 mg at 07/07/18 2210 .  gabapentin (NEURONTIN) tablet 600 mg, 600 mg, Oral, QPM, Mita Vallo A, MD, 600 mg at 07/07/18 1859 .  heparin injection 5,000 Units, 5,000 Units, Subcutaneous, Q8H, Judith Part, MD, 5,000 Units at 07/08/18 [phone]  HYDROmorphone (DILAUDID) injection 0.5 mg, 0.5 mg, Intravenous, Q2H PRN, Judith Part, MD, 0.5 mg at 07/04/18 1129 .  insulin aspart (novoLOG) injection 0-15 Units, 0-15 Units, Subcutaneous, TID WC, Judith Part, MD, 3 Units at 07/07/18 1900 .  insulin aspart (novoLOG) injection 10 Units, 10 Units, Subcutaneous, TID WC, Judith Part, MD, 10 Units at 07/07/18 1901 .  insulin glargine (LANTUS) injection 100 Units, 100 Units, Subcutaneous, QHS, Judith Part, MD, 100 Units at 07/07/18 2217 .  levothyroxine (SYNTHROID, LEVOTHROID) tablet 137 mcg, 137 mcg, Oral, QAC breakfast, Judith Part, MD, 137 mcg at 07/08/18 0846 .  lisinopril (PRINIVIL,ZESTRIL) tablet 20 mg, 20 mg, Oral, Daily, Judith Part, MD, 20 mg at 07/07/18 0903 .  Melatonin TABS 9 mg, 9 mg, Oral, QHS, Koree Schopf, Joyice Faster, MD, 9 mg at 07/07/18 2211 .  menthol-cetylpyridinium (CEPACOL) lozenge 3 mg, 1 lozenge, Oral, PRN **OR** phenol (CHLORASEPTIC) mouth spray 1 spray, 1 spray, Mouth/Throat, PRN,  Judith Part, MD .  metoprolol tartrate (LOPRESSOR) tablet 50 mg, 50 mg, Oral, BID, Annamay Laymon, Joyice Faster, MD, 50 mg at 07/07/18 2210 .  mirabegron ER (MYRBETRIQ) tablet 50 mg, 50 mg, Oral, Daily, Judith Part, MD, 50 mg at 07/07/18 0909 .  nystatin (MYCOSTATIN/NYSTOP) topical powder 1 g, 1 g, Topical, Daily PRN, Masiah Woody A, MD .  nystatin cream (MYCOSTATIN) 1 application, 1 application, Topical, BID PRN, Judith Part, MD, 1 application at 02/63/78 [phone]  ondansetron (ZOFRAN) tablet 4 mg, 4 mg, Oral, Q6H PRN **OR** ondansetron (ZOFRAN) injection 4 mg, 4 mg, Intravenous, Q6H PRN, Windy Dudek A, MD .  oxyCODONE  (Oxy IR/ROXICODONE) immediate release tablet 10 mg, 10 mg, Oral, Q4H PRN, Judith Part, MD, 10 mg at 07/08/18 0277 .  oxyCODONE (Oxy IR/ROXICODONE) immediate release tablet 5 mg, 5 mg, Oral, Q4H PRN, Judith Part, MD, 5 mg at 07/05/18 1924 .  polyethylene glycol (MIRALAX / GLYCOLAX) packet 17 g, 17 g, Oral, Daily PRN, Judith Part, MD, 17 g at 07/07/18 1939 .  polyvinyl alcohol (LIQUIFILM TEARS) 1.4 % ophthalmic solution 1 drop, 1 drop, Both Eyes, QID PRN, Judith Part, MD .  pregabalin (LYRICA) capsule 150 mg, 150 mg, Oral, BID, Judith Part, MD, 150 mg at 07/07/18 2208 .  sodium chloride flush (NS) 0.9 % injection 3 mL, 3 mL, Intravenous, Q12H, Myran Arcia, Joyice Faster, MD, 3 mL at 07/07/18 2225 .  sodium chloride flush (NS) 0.9 % injection 3 mL, 3 mL, Intravenous, PRN, Judith Part, MD .  tiZANidine (ZANAFLEX) tablet 2 mg, 2 mg, Oral, TID, Judith Part, MD, 2 mg at 07/07/18 2227 .  triamterene-hydrochlorothiazide (MAXZIDE-25) 37.5-25 MG per tablet 1 tablet, 1 tablet, Oral, Daily, Judith Part, MD, 1 tablet at 07/07/18 0909 .  umeclidinium-vilanterol (ANORO ELLIPTA) 62.5-25 MCG/INH 1 puff, 1 puff, Inhalation, Daily, Judith Part, MD, 1 puff at 07/08/18 0917 .  venlafaxine XR (EFFEXOR-XR) 24 hr capsule 75 mg, 75 mg, Oral, Q breakfast, Adolphus Hanf, Joyice Faster, MD, 75 mg at 07/08/18 0846   Physical Exam: Awake/alert, strength 5/5 x4, SILTx4  Assessment & Plan: 77 y.o. woman s/p lumbar laminectomies, recovering well. -ant thigh pain likely meralgia parasthetica - doesn't appear too bothersome, already on good dose of gabapentin, reassured her that it will resolve -PT/OT rec SNF, awaiting placement -SCDs/TEDs, SQH  Judith Part  07/08/18 9:45 AM

## 2018-07-09 LAB — GLUCOSE, CAPILLARY
GLUCOSE-CAPILLARY: 161 mg/dL — AB (ref 70–99)
GLUCOSE-CAPILLARY: 108 mg/dL — AB (ref 70–99)
Glucose-Capillary: 159 mg/dL — ABNORMAL HIGH (ref 70–99)
Glucose-Capillary: 152 mg/dL — ABNORMAL HIGH (ref 70–99)
Glucose-Capillary: 105 mg/dL — ABNORMAL HIGH (ref 70–99)

## 2018-07-09 NOTE — Progress Notes (Signed)
Physical Therapy Treatment Patient Details Name: Katherine Delgado MRN: [id number] DOB: Jun 21, 1941 Today's Date: 07/09/2018    History of Present Illness 77 y.o. female admitted on 07/03/18 for elective L3-5 lumbar leaminectomies.  Pt with significant PMH of DM, thyroid disease, RA, osteoporosis, morbid obesity, HTN, Guillian Barre syndrome, fibromyalgia, COPD, bil knee surgery, back surgery (2202).      PT Comments    Pt pleasant and willing to mobilize. Pt reports she did not get OOB at all 11/17 and educated for importance of mobility with nursing even if using Stedy for only bed <>chair. Pt with significant weakness today and unable to step away from surface or even shift weight in standing. Pt able to tolerate 4 trials of 1 min in standing with encouragement and cues for posture. Pt educated for HEP and precautions. Rn aware or need for stedy and decreased function this date.     Follow Up Recommendations  SNF;Supervision/Assistance - 24 hour     Equipment Recommendations  None recommended by PT    Recommendations for Other Services       Precautions / Restrictions Precautions Precautions: Fall;Back Precaution Comments: reviewed precautions with pt    Mobility  Bed Mobility Overal bed mobility: Needs Assistance Bed Mobility: Rolling;Sidelying to Sit Rolling: Min assist Sidelying to sit: Min assist       General bed mobility comments: reliance on rail with cues for sequence and assist to fully roll and elevate trunk from surface  Transfers Overall transfer level: Needs assistance   Transfers: Sit to/from Stand Sit to Stand: Min assist;+2 safety/equipment;From elevated surface         General transfer comment: min assist to stand from bed x 3 trials with cues for hand placement. Pt pulling up on RW x 2 trials despite cues for hand placement, increased bed height, 3rd trial from bed using stedy. 4th standing trial from stedy pads into standing, lack of control of  descent to chair. Pt stood 1 min each trial. PT unable to step or advance feet today in standing  Ambulation/Gait             General Gait Details: unable today   Stairs             Wheelchair Mobility    Modified Rankin (Stroke Patients Only)       Balance Overall balance assessment: Needs assistance Sitting-balance support: Feet supported;Bilateral upper extremity supported Sitting balance-Leahy Scale: Fair     Standing balance support: Bilateral upper extremity supported Standing balance-Leahy Scale: Poor Standing balance comment: needs external support                            Cognition Arousal/Alertness: Awake/alert Behavior During Therapy: Flat affect Overall Cognitive Status: Impaired/Different from baseline Area of Impairment: Memory;Safety/judgement;Problem solving                   Current Attention Level: Selective Memory: Decreased recall of precautions Following Commands: Follows one step commands consistently Safety/Judgement: Decreased awareness of safety;Decreased awareness of deficits   Problem Solving: Slow processing        Exercises General Exercises - Lower Extremity Long Arc Quad: AROM;Both;15 reps;Seated Hip Flexion/Marching: 10 reps;AROM;AAROM;Seated;Right;Left(AaROM on RLE)    General Comments        Pertinent Vitals/Pain Faces Pain Scale: Hurts little more Pain Location: back and right leg Pain Descriptors / Indicators: Aching;Sore Pain Intervention(s): Limited activity within patient's tolerance;Repositioned;Monitored during session;Premedicated before  session    Home Living                      Prior Function            PT Goals (current goals can now be found in the care plan section) Progress towards PT goals: Not progressing toward goals - comment(pt with decreased strength and unable to step today)    Frequency    Min 5X/week      PT Plan Current plan remains appropriate     Co-evaluation              AM-PAC PT "6 Clicks" Daily Activity  Outcome Measure  Difficulty turning over in bed (including adjusting bedclothes, sheets and blankets)?: Unable Difficulty moving from lying on back to sitting on the side of the bed? : Unable Difficulty sitting down on and standing up from a chair with arms (e.g., wheelchair, bedside commode, etc,.)?: Unable Help needed moving to and from a bed to chair (including a wheelchair)?: A Lot Help needed walking in hospital room?: A Lot Help needed climbing 3-5 steps with a railing? : Total 6 Click Score: 8    End of Session Equipment Utilized During Treatment: Gait belt Activity Tolerance: Patient limited by fatigue Patient left: in chair;with call bell/phone within reach;with chair alarm set Nurse Communication: Mobility status;Need for lift equipment PT Visit Diagnosis: Difficulty in walking, not elsewhere classified (R26.2);Muscle weakness (generalized) (M62.81);Pain;Other symptoms and signs involving the nervous system (R29.898)     Time: 1700-1749 PT Time Calculation (min) (ACUTE ONLY): 25 min  Charges:  $Therapeutic Exercise: 8-22 mins $Therapeutic Activity: 8-22 mins                     [place], PT Acute Rehabilitation Services Pager: [phone] Office: [place] 07/09/2018, 11:05 AM

## 2018-07-09 NOTE — Social Work (Signed)
CSW met with pt at bedside, pt son has indicated the following preferences: 1. [facility] 2. [facility] 3. [facility]  CSW has confirmed bed availability with [facility]. Left CSW contact information at bedside.  Received signed 30 day note for [facility]. Will submit to [facility].  Westley Hummer, MSW, [place] [employer] [phone]

## 2018-07-09 NOTE — Progress Notes (Signed)
Neurosurgery Service Progress Note  Subjective: No acute events overnight, right ant thigh pain improving  Objective: Vitals:   07/08/18 2248 07/09/18 0300 07/09/18 0800 07/09/18 0917  BP: (!) 104/46 (!) 135/59  119/66  Pulse: (!) 57 96 66 70  Resp: 16 16  18   Temp: 98.8 F (37.1 C) 98.6 F (37 C)  98.3 F (36.8 C)  TempSrc: Oral Oral  Oral  SpO2: 95% 96% 93% 98%  Weight:      Height:       Temp (24hrs), Avg:98.6 F (37 C), Min:98.3 F (36.8 C), Max:98.8 F (37.1 C)  CBC Latest Ref Rng & Units 07/03/2018 04/06/2018 03/06/2018  WBC 4.0 - 10.5 K/uL 14.7(H) 14.5(H) 16.6(H)  Hemoglobin 12.0 - 15.0 g/dL 13.2 13.2 13.7  Hematocrit 36.0 - 46.0 % 44.2 39.7 42.1  Platelets 150 - 400 K/uL 348 346 307   BMP Latest Ref Rng & Units 07/03/2018 04/06/2018 01/22/2018  Glucose 70 - 99 mg/dL 194(H) 102(H) 198(H)  BUN 8 - 23 mg/dL 23 18 14   Creatinine 0.44 - 1.00 mg/dL 0.98 0.93 0.97(H)  BUN/Creat Ratio 6 - 22 (calc) - NOT APPLICABLE 14  Sodium 245 - 145 mmol/L 137 141 139  Potassium 3.5 - 5.1 mmol/L 3.9 4.8 4.6  Chloride 98 - 111 mmol/L 100 103 101  CO2 22 - 32 mmol/L 29 31 30   Calcium 8.9 - 10.3 mg/dL 9.4 9.1 9.7    Intake/Output Summary (Last 24 hours) at 07/09/2018 1439 Last data filed at 07/09/2018 1405 Gross per 24 hour  Intake 480 ml  Output 2801 ml  Net -2321 ml    Current Facility-Administered Medications:  .  0.9 %  sodium chloride infusion, 250 mL, Intravenous, Continuous, Dulce Martian A, MD, Last Rate: 1 mL/hr at 07/03/18 1821, 250 mL at 07/03/18 1821 .  acetaminophen (TYLENOL) tablet 650 mg, 650 mg, Oral, Q4H PRN, 650 mg at 07/07/18 0903 **OR** acetaminophen (TYLENOL) suppository 650 mg, 650 mg, Rectal, Q4H PRN, Gjon Letarte A, MD .  albuterol (PROVENTIL) (2.5 MG/3ML) 0.083% nebulizer solution 3 mL, 3 mL, Inhalation, Q6H PRN, Judith Part, MD .  cyclobenzaprine (FLEXERIL) tablet 10 mg, 10 mg, Oral, TID PRN, Judith Part, MD, 10 mg at 07/09/18  1204 .  docusate sodium (COLACE) capsule 100 mg, 100 mg, Oral, BID, Aaryn Parrilla A, MD, 100 mg at 07/09/18 0900 .  gabapentin (NEURONTIN) tablet 600 mg, 600 mg, Oral, QPM, Saintclair Schroader A, MD, 600 mg at 07/08/18 1848 .  heparin injection 5,000 Units, 5,000 Units, Subcutaneous, Q8H, Riyanshi Wahab, Joyice Faster, MD, 5,000 Units at 07/09/18 1300 .  HYDROmorphone (DILAUDID) injection 0.5 mg, 0.5 mg, Intravenous, Q2H PRN, Judith Part, MD, 0.5 mg at 07/04/18 1129 .  insulin aspart (novoLOG) injection 0-15 Units, 0-15 Units, Subcutaneous, TID WC, Judith Part, MD, 3 Units at 07/09/18 1203 .  insulin aspart (novoLOG) injection 10 Units, 10 Units, Subcutaneous, TID WC, Judith Part, MD, 10 Units at 07/09/18 1203 .  insulin glargine (LANTUS) injection 100 Units, 100 Units, Subcutaneous, QHS, Judith Part, MD, 100 Units at 07/07/18 2217 .  levothyroxine (SYNTHROID, LEVOTHROID) tablet 137 mcg, 137 mcg, Oral, QAC breakfast, Judith Part, MD, 137 mcg at 07/09/18 [phone] .  lisinopril (PRINIVIL,ZESTRIL) tablet 20 mg, 20 mg, Oral, Daily, Itzae Mccurdy A, MD, 20 mg at 07/09/18 0900 .  Melatonin TABS 9 mg, 9 mg, Oral, QHS, Arlena Marsan, Joyice Faster, MD, 9 mg at 07/08/18 2058 .  menthol-cetylpyridinium (CEPACOL) lozenge 3 mg,  1 lozenge, Oral, PRN **OR** phenol (CHLORASEPTIC) mouth spray 1 spray, 1 spray, Mouth/Throat, PRN, Norvell Ureste A, MD .  metoprolol tartrate (LOPRESSOR) tablet 50 mg, 50 mg, Oral, BID, Rabia Argote A, MD, 50 mg at 07/09/18 0900 .  mirabegron ER (MYRBETRIQ) tablet 50 mg, 50 mg, Oral, Daily, Vencil Basnett A, MD, 50 mg at 07/09/18 0900 .  nystatin (MYCOSTATIN/NYSTOP) topical powder 1 g, 1 g, Topical, Daily PRN, Kelven Flater A, MD .  nystatin cream (MYCOSTATIN) 1 application, 1 application, Topical, BID PRN, Judith Part, MD, 1 application at 32/54/98 [phone] .  ondansetron (ZOFRAN) tablet 4 mg, 4 mg, Oral, Q6H PRN **OR** ondansetron (ZOFRAN)  injection 4 mg, 4 mg, Intravenous, Q6H PRN, Taleshia Luff A, MD .  oxyCODONE (Oxy IR/ROXICODONE) immediate release tablet 10 mg, 10 mg, Oral, Q4H PRN, Judith Part, MD, 10 mg at 07/09/18 1204 .  oxyCODONE (Oxy IR/ROXICODONE) immediate release tablet 5 mg, 5 mg, Oral, Q4H PRN, Judith Part, MD, 5 mg at 07/08/18 2057 .  polyethylene glycol (MIRALAX / GLYCOLAX) packet 17 g, 17 g, Oral, Daily PRN, Judith Part, MD, 17 g at 07/07/18 1939 .  polyvinyl alcohol (LIQUIFILM TEARS) 1.4 % ophthalmic solution 1 drop, 1 drop, Both Eyes, QID PRN, Ziya Coonrod A, MD .  pregabalin (LYRICA) capsule 150 mg, 150 mg, Oral, BID, Takeisha Cianci A, MD, 150 mg at 07/09/18 0900 .  sodium chloride flush (NS) 0.9 % injection 3 mL, 3 mL, Intravenous, Q12H, Marilou Barnfield A, MD, 3 mL at 07/09/18 0900 .  sodium chloride flush (NS) 0.9 % injection 3 mL, 3 mL, Intravenous, PRN, Swain Acree A, MD .  tiZANidine (ZANAFLEX) tablet 2 mg, 2 mg, Oral, TID, Shaquandra Galano, Joyice Faster, MD, 2 mg at 07/09/18 0900 .  triamterene-hydrochlorothiazide (MAXZIDE-25) 37.5-25 MG per tablet 1 tablet, 1 tablet, Oral, Daily, Annica Marinello, Joyice Faster, MD, 1 tablet at 07/09/18 0900 .  umeclidinium-vilanterol (ANORO ELLIPTA) 62.5-25 MCG/INH 1 puff, 1 puff, Inhalation, Daily, Judith Part, MD, 1 puff at 07/09/18 0916 .  venlafaxine XR (EFFEXOR-XR) 24 hr capsule 75 mg, 75 mg, Oral, Q breakfast, Judith Part, MD, 75 mg at 07/09/18 5830   Physical Exam: Awake/alert, strength 5/5 x4, SILTx4  Assessment & Plan: 77 y.o. woman s/p lumbar laminectomies, recovering well. -PT/OT rec SNF, awaiting placement -CBGs within appropriate limits for post-op control -SCDs/TEDs, SQH  Judith Part  07/09/18 2:39 PM

## 2018-07-09 NOTE — Clinical Social Work Note (Signed)
Clinical Social Work Assessment  Patient Details  Name: Katherine Delgado MRN: [id number] Date of Birth: [birth year]  Date of referral:  07/09/18               Reason for consult:  Facility Placement, Discharge Planning                Permission sought to share information with:  Facility Sport and exercise psychologist, Family Supports Permission granted to share information::  Yes, Verbal Permission Granted  Name::     Jillisa Harris  Agency::  SNFs  Relationship::  pt son  Contact Information:  [phone]  Housing/Transportation Living arrangements for the past 2 months:  Single Family Home Source of Information:  Patient Patient Interpreter Needed:  None Criminal Activity/Legal Involvement Pertinent to Current Situation/Hospitalization:  No - Comment as needed Significant Relationships:  Adult Children, Warehouse manager, Other Family Members Lives with:  Adult Children Do you feel safe going back to the place where you live?  Yes Need for family participation in patient care:  Yes (Comment)  Care giving concerns:  Pt lives at home with her adult son who she states can provide supervision and physical assistance. She is interested in SNF level therapies as she is weaker than she anticipated post surgery.    Social Worker assessment / plan:  CSW met with pt at bedside. Introduced self, role and reason for visit. Pt adult son Ovid Curd lives with pt and provides 24/7 assistance. Pt states that he is able to provide physical and mental support. Explained PT/OT recommendations for hands on consistent therapy at SNF level. Pt interested, she is disappointed at level of pain and limited mobility following surgery. We discussed that recovery happens at different paces or different people and that many individuals at her age and with her health needs do go to SNF before returning home. We discussed the coverage for SNF under Medicare and she would like to see her offers.   Employment status:   Retired Forensic scientist:  [employer] PT Recommendations:  [place], [place] / Referral to community resources:  [place]  Patient/Family's Response to care:  Pt amenable to speaking with CSW, interested in SNF offers.   Patient/Family's Understanding of and Emotional Response to Diagnosis, Current Treatment, and Prognosis:  Pt states understanding of diagnosis, current treatment and prognosis. Pt was flat but appropriate throughout assessment. She understands recommendations and would like to see her offers. She is disappointed at lack of mobility, but appears happy with care here at hospital.   Emotional Assessment Appearance:  Appears stated age Attitude/Demeanor/Rapport:  Gracious, Engaged Affect (typically observed):  Accepting, Adaptable, Calm, Appropriate Orientation:  Oriented to Self, Oriented to Place, Oriented to  Time, Oriented to Situation Alcohol / Substance use:  Not Applicable Psych involvement (Current and /or in the community):  No (Comment)  Discharge Needs  Concerns to be addressed:  Care Coordination Readmission within the last 30 days:  No Current discharge risk:  Physical Impairment, Dependent with Mobility Barriers to Discharge:  Awaiting State Approval ([place]), Continued Medical Work up   [employer], [place] 07/09/2018, 11:40 AM

## 2018-07-09 NOTE — Progress Notes (Signed)
Occupational Therapy Treatment Patient Details Name: MERANDA DECHAINE MRN: [id number] DOB: [birth year] Today's Date: 07/09/2018    History of present illness 77 y.o. female admitted on 07/03/18 for elective L3-5 lumbar leaminectomies.  Pt with significant PMH of DM, thyroid disease, RA, osteoporosis, morbid obesity, HTN, Guillian Barre syndrome, fibromyalgia, COPD, bil knee surgery, back surgery (6063).     OT comments  Pt presents sitting up in recliner agreeable to therapy session. Focus of session on continued education with use of AE for performing LB ADLs while maintaining back precautions. Pt with improvements in recall/use of AE from previous session, though continues to require overall mod cues for proper technique. Continues to require frequent cues to maintain back precautions during functional tasks, increased time/cues for processing and following directions. Given pt's slow progress and current level of assist discharge recommendations have been updated, feel pt will benefit from [place] rehab stay in SNF setting to maximize her safety and independence with ADLs and mobility prior to return home. Will continue to follow acutely to progress pt towards established OT goals.    Follow Up Recommendations  SNF;Supervision/Assistance - 24 hour    Equipment Recommendations  None recommended by OT(to be further assessed in next venue)          Precautions / Restrictions Precautions Precautions: Fall;Back Precaution Comments: reviewed precautions with pt       Mobility Bed Mobility Overal bed mobility: Needs Assistance Bed Mobility: Rolling;Sidelying to Sit Rolling: Min assist Sidelying to sit: Min assist       General bed mobility comments: OOB in recliner upon arrival  Transfers Overall transfer level: Needs assistance   Transfers: Sit to/from Stand Sit to Stand: Min assist;+2 safety/equipment;From elevated surface         General transfer comment: min assist to stand from  bed x 3 trials with cues for hand placement. Pt pulling up on RW x 2 trials despite cues for hand placement, increased bed height, 3rd trial from bed using stedy. 4th standing trial from stedy pads into standing, lack of control of descent to chair. Pt stood 1 min each trial. PT unable to step or advance feet today in standing    Balance Overall balance assessment: Needs assistance Sitting-balance support: Feet supported;Bilateral upper extremity supported Sitting balance-Leahy Scale: Fair     Standing balance support: Bilateral upper extremity supported Standing balance-Leahy Scale: Poor Standing balance comment: needs external support                           ADL either performed or assessed with clinical judgement   ADL Overall ADL's : Needs assistance/impaired                       Lower Body Dressing Details (indicate cue type and reason): continued review on use of AE for performing LB ADL, pt with improved recall of use of AE and ability to follow instruction to perform, requires min-mod cues for technique and to maintain back precautions                General ADL Comments: pt continues to require cues for back precautions     Vision       Perception     Praxis      Cognition Arousal/Alertness: Awake/alert Behavior During Therapy: Flat affect Overall Cognitive Status: Impaired/Different from baseline Area of Impairment: Memory;Safety/judgement;Problem solving  Current Attention Level: Selective Memory: Decreased recall of precautions Following Commands: Follows one step commands consistently Safety/Judgement: Decreased awareness of safety;Decreased awareness of deficits   Problem Solving: Slow processing;Difficulty sequencing General Comments: continues to require increased time to follow commands; frequent cues to maintain back precautions; pt attempting to don sock on foot which already had a sock on, requires  cues to correct        Exercises General Exercises - Lower Extremity Long Arc Quad: AROM;Both;15 reps;Seated Hip Flexion/Marching: 10 reps;AROM;AAROM;Seated;Right;Left(AaROM on RLE)   Shoulder Instructions       General Comments      Pertinent Vitals/ Pain       Pain Assessment: Faces Faces Pain Scale: Hurts little more Pain Location: back and right leg Pain Descriptors / Indicators: Aching;Sore Pain Intervention(s): Monitored during session;Limited activity within patient's tolerance  Home Living                                          Prior Functioning/Environment              Frequency  Min 2X/week        Progress Toward Goals  OT Goals(current goals can now be found in the care plan section)  Progress towards OT goals: Progressing toward goals  Acute Rehab OT Goals Patient Stated Goal: to go home OT Goal Formulation: With patient Time For Goal Achievement: 07/18/18 Potential to Achieve Goals: Good ADL Goals Pt Will Perform Lower Body Bathing: with supervision;with adaptive equipment;sit to/from stand Pt Will Perform Lower Body Dressing: with min assist;with caregiver independent in assisting;with adaptive equipment;sit to/from stand Pt Will Transfer to Toilet: ambulating;with supervision Pt Will Perform Toileting - Clothing Manipulation and hygiene: with mod assist;with adaptive equipment;sit to/from stand Additional ADL Goal #1: Pt will perform bed mobiilty at supervision level (demonstrating log roll technique) prior to engaging in ADL activity  Plan Discharge plan needs to be updated;Frequency needs to be updated    Co-evaluation                 AM-PAC PT "6 Clicks" Daily Activity     Outcome Measure   Help from another person eating meals?: A Little Help from another person taking care of personal grooming?: A Little Help from another person toileting, which includes using toliet, bedpan, or urinal?: A Lot Help from  another person bathing (including washing, rinsing, drying)?: A Lot Help from another person to put on and taking off regular upper body clothing?: A Little Help from another person to put on and taking off regular lower body clothing?: A Lot 6 Click Score: 15    End of Session Equipment Utilized During Treatment: Other (comment)(AE)  OT Visit Diagnosis: Unsteadiness on feet (R26.81);Other abnormalities of gait and mobility (R26.89);Pain Pain - part of body: (back)   Activity Tolerance Patient tolerated treatment well   Patient Left in chair;with call bell/phone within reach   Nurse Communication Mobility status        Time: 1013-1030 OT Time Calculation (min): 17 min  Charges: OT General Charges $OT Visit: 1 Visit OT Treatments $Self Care/Home Management : 8-22 mins  Lou Cal, OT [facility] Services Pager [phone] Office [phone]   [facility] 07/09/2018, 12:36 PM

## 2018-07-09 NOTE — Social Work (Signed)
CSW acknowledging consult for SNF placement. Pt PASRR is pending, will need signed FL2 and 30 day note to submit to [facility].  [place], [place] Work [phone]

## 2018-07-09 NOTE — NC FL2 (Signed)
[place] MEDICAID FL2 LEVEL OF CARE SCREENING TOOL     IDENTIFICATION  Patient Name: Katherine Delgado Birthdate: [birth year] Sex: female Admission Date (Current Location): 07/03/2018  [facility] and [place] Number:  Herbalist and Address:  [facility]. [facility], [place] [address] [place], [place] [address]      Provider Number: [id number]  Attending Physician Name and Address:  Judith Part, MD  Relative Name and Phone Number:  Seara Hinesley; son; [phone]    Current Level of Care: Hospital Recommended Level of Care: [place] Prior Approval Number:    Date Approved/Denied:   PASRR Number: pending  Discharge Plan: SNF    Current Diagnoses: Patient Active Problem List   Diagnosis Date Noted  . Lumbar spinal stenosis 03/30/2018  . Ulcer of toe of left foot, with necrosis of bone ([place]) 07/25/2016  . Rheumatoid arthritis with positive rheumatoid factor ([place]) 06/14/2016  . High risk medication use 06/14/2016  . Sjogren's syndrome ([place]) 06/14/2016  . Primary osteoarthritis of both knees 06/14/2016  . DDD (degenerative disc disease), lumbar 06/14/2016  . Hypothyroidism 06/14/2016  . Osteoporosis 06/14/2016  . Abnormality of gait 04/19/2016  . Type 2 diabetes mellitus with diabetic polyneuropathy, with long-term current use of insulin ([place]) 11/27/2015  . Severe obesity (BMI >= 40) ([place]) 04/21/2014  . Type II or unspecified type diabetes mellitus with peripheral circulatory disorders, uncontrolled(250.72) 09/12/2013  . Headache(784.0) 07/31/2013  . Sinus infection 07/31/2013  . Chronic low back pain 03/14/2013  . Insomnia 12/06/2012  . Nausea alone 12/06/2012  . Diarrhea 12/06/2012  . Urinary incontinence, urge 12/06/2012  . Lumbosacral root lesions, not elsewhere classified 11/27/2012  . Diabetic polyneuropathy associated with type 2 diabetes mellitus ([place]) 11/27/2012  . EDEMA 05/04/2010  . YEAST INFECTION 05/03/2010  .  Hyperlipidemia 05/03/2010  . DEPRESSION 05/03/2010  . History of peripheral neuropathy 05/03/2010  . GUILLAIN-BARRE SYNDROME 05/03/2010  . Essential hypertension 05/03/2010  . ALLERGIC RHINITIS 05/03/2010  . PNEUMONIA 05/03/2010  . COPD (chronic obstructive pulmonary disease) ([place]) 05/03/2010  . GERD 05/03/2010  . Fibromyalgia 05/03/2010  . DYSPNEA 05/03/2010  . CHEST PAIN 05/03/2010    Orientation RESPIRATION BLADDER Height & Weight     Self, Time, Situation, Place  Normal Incontinent, External catheter Weight: 252 lb (114.3 kg) Height:  5\' 6"  (167.6 cm)  BEHAVIORAL SYMPTOMS/MOOD NEUROLOGICAL BOWEL NUTRITION STATUS      Continent Diet(see discharge summary)  AMBULATORY STATUS COMMUNICATION OF NEEDS Skin   Extensive Assist Verbally Surgical wounds(incision on back with liquid skin adhesive)                       Personal Care Assistance Level of Assistance  Bathing, Feeding, Dressing Bathing Assistance: Maximum assistance Feeding assistance: Independent Dressing Assistance: Maximum assistance     Functional Limitations Info  Sight, Hearing, Speech Sight Info: Adequate Hearing Info: Adequate Speech Info: Adequate    SPECIAL CARE FACTORS FREQUENCY  PT (By licensed PT), OT (By licensed OT)     PT Frequency: 5x week OT Frequency: 5x week            Contractures Contractures Info: Not present    Additional Factors Info  Code Status, Allergies, Psychotropic, Insulin Sliding Scale Code Status Info: Full Code Allergies Info: INFLUENZA VACCINES, PENICILLINS, STATINS, SULFAMETHOXAZOLE-TRIMETHOPRIM  Psychotropic Info: pregabalin (LYRICA) capsule 150 mg 2x daily; venlafaxine XR (EFFEXOR-XR) 24 hr capsule 75 mg daily with breakfast Insulin Sliding Scale Info: insulin aspart (  novoLOG) injection 0-15 Units 3x day with meals; insulin aspart (novoLOG) injection 10 Units 3x daily with meals; insulin glargine (LANTUS) injection 100 Units daily at bedtime       Current  Medications (07/09/2018):  This is the current hospital active medication list Current Facility-Administered Medications  Medication Dose Route Frequency Provider Last Rate Last Dose  . 0.9 %  sodium chloride infusion  250 mL Intravenous Continuous Judith Part, MD 1 mL/hr at 07/03/18 1821 250 mL at 07/03/18 1821  . acetaminophen (TYLENOL) tablet 650 mg  650 mg Oral Q4H PRN Judith Part, MD   650 mg at 07/07/18 3557   Or  . acetaminophen (TYLENOL) suppository 650 mg  650 mg Rectal Q4H PRN Judith Part, MD      . albuterol (PROVENTIL) (2.5 MG/3ML) 0.083% nebulizer solution 3 mL  3 mL Inhalation Q6H PRN Judith Part, MD      . cyclobenzaprine (FLEXERIL) tablet 10 mg  10 mg Oral TID PRN Judith Part, MD   10 mg at 07/09/18 0500  . docusate sodium (COLACE) capsule 100 mg  100 mg Oral BID Judith Part, MD   100 mg at 07/09/18 0900  . gabapentin (NEURONTIN) tablet 600 mg  600 mg Oral QPM Judith Part, MD   600 mg at 07/08/18 1848  . heparin injection 5,000 Units  5,000 Units Subcutaneous Q8H Judith Part, MD   5,000 Units at 07/09/18 0500  . HYDROmorphone (DILAUDID) injection 0.5 mg  0.5 mg Intravenous Q2H PRN Judith Part, MD   0.5 mg at 07/04/18 1129  . insulin aspart (novoLOG) injection 0-15 Units  0-15 Units Subcutaneous TID WC Judith Part, MD   3 Units at 07/09/18 0830  . insulin aspart (novoLOG) injection 10 Units  10 Units Subcutaneous TID WC Judith Part, MD   10 Units at 07/09/18 0830  . insulin glargine (LANTUS) injection 100 Units  100 Units Subcutaneous QHS Judith Part, MD   100 Units at 07/07/18 2217  . levothyroxine (SYNTHROID, LEVOTHROID) tablet 137 mcg  137 mcg Oral QAC breakfast Judith Part, MD   137 mcg at 07/09/18 [phone]  . lisinopril (PRINIVIL,ZESTRIL) tablet 20 mg  20 mg Oral Daily Judith Part, MD   20 mg at 07/09/18 0900  . Melatonin TABS 9 mg  9 mg Oral QHS Judith Part, MD    9 mg at 07/08/18 2058  . menthol-cetylpyridinium (CEPACOL) lozenge 3 mg  1 lozenge Oral PRN Judith Part, MD       Or  . phenol (CHLORASEPTIC) mouth spray 1 spray  1 spray Mouth/Throat PRN Judith Part, MD      . metoprolol tartrate (LOPRESSOR) tablet 50 mg  50 mg Oral BID Judith Part, MD   50 mg at 07/09/18 0900  . mirabegron ER (MYRBETRIQ) tablet 50 mg  50 mg Oral Daily Judith Part, MD   50 mg at 07/09/18 0900  . nystatin (MYCOSTATIN/NYSTOP) topical powder 1 g  1 g Topical Daily PRN Judith Part, MD      . nystatin cream (MYCOSTATIN) 1 application  1 application Topical BID PRN Judith Part, MD   1 application at 25/42/70 [phone]  . ondansetron (ZOFRAN) tablet 4 mg  4 mg Oral Q6H PRN Judith Part, MD       Or  . ondansetron (ZOFRAN) injection 4 mg  4 mg Intravenous Q6H PRN Judith Part, MD      .  oxyCODONE (Oxy IR/ROXICODONE) immediate release tablet 10 mg  10 mg Oral Q4H PRN Judith Part, MD   10 mg at 07/09/18 0500  . oxyCODONE (Oxy IR/ROXICODONE) immediate release tablet 5 mg  5 mg Oral Q4H PRN Judith Part, MD   5 mg at 07/08/18 2057  . polyethylene glycol (MIRALAX / GLYCOLAX) packet 17 g  17 g Oral Daily PRN Judith Part, MD   17 g at 07/07/18 1939  . polyvinyl alcohol (LIQUIFILM TEARS) 1.4 % ophthalmic solution 1 drop  1 drop Both Eyes QID PRN Judith Part, MD      . pregabalin (LYRICA) capsule 150 mg  150 mg Oral BID Judith Part, MD   150 mg at 07/09/18 0900  . sodium chloride flush (NS) 0.9 % injection 3 mL  3 mL Intravenous Q12H Judith Part, MD   3 mL at 07/09/18 0900  . sodium chloride flush (NS) 0.9 % injection 3 mL  3 mL Intravenous PRN Judith Part, MD      . tiZANidine (ZANAFLEX) tablet 2 mg  2 mg Oral TID Judith Part, MD   2 mg at 07/09/18 0900  . triamterene-hydrochlorothiazide (MAXZIDE-25) 37.5-25 MG per tablet 1 tablet  1 tablet Oral Daily Judith Part, MD    1 tablet at 07/09/18 0900  . umeclidinium-vilanterol (ANORO ELLIPTA) 62.5-25 MCG/INH 1 puff  1 puff Inhalation Daily Judith Part, MD   1 puff at 07/08/18 0917  . venlafaxine XR (EFFEXOR-XR) 24 hr capsule 75 mg  75 mg Oral Q breakfast Judith Part, MD   75 mg at 07/09/18 6503     Discharge Medications: Please see discharge summary for a list of discharge medications.  Relevant Imaging Results:  Relevant Lab Results:   Additional Information [id number] [place], [place]

## 2018-07-10 DIAGNOSIS — M5136 Other intervertebral disc degeneration, lumbar region: Secondary | ICD-10-CM | POA: Diagnosis not present

## 2018-07-10 DIAGNOSIS — E119 Type 2 diabetes mellitus without complications: Secondary | ICD-10-CM | POA: Diagnosis not present

## 2018-07-10 DIAGNOSIS — Z794 Long term (current) use of insulin: Secondary | ICD-10-CM | POA: Diagnosis not present

## 2018-07-10 DIAGNOSIS — M9936 Osseous stenosis of neural canal of lower extremity: Secondary | ICD-10-CM | POA: Diagnosis not present

## 2018-07-10 DIAGNOSIS — M9973 Connective tissue and disc stenosis of intervertebral foramina of lumbar region: Secondary | ICD-10-CM | POA: Diagnosis not present

## 2018-07-10 DIAGNOSIS — Z4789 Encounter for other orthopedic aftercare: Secondary | ICD-10-CM | POA: Diagnosis not present

## 2018-07-10 DIAGNOSIS — R5381 Other malaise: Secondary | ICD-10-CM | POA: Diagnosis not present

## 2018-07-10 DIAGNOSIS — Z743 Need for continuous supervision: Secondary | ICD-10-CM | POA: Diagnosis not present

## 2018-07-10 DIAGNOSIS — J438 Other emphysema: Secondary | ICD-10-CM | POA: Diagnosis not present

## 2018-07-10 DIAGNOSIS — E039 Hypothyroidism, unspecified: Secondary | ICD-10-CM | POA: Diagnosis not present

## 2018-07-10 DIAGNOSIS — S31000D Unspecified open wound of lower back and pelvis without penetration into retroperitoneum, subsequent encounter: Secondary | ICD-10-CM | POA: Diagnosis not present

## 2018-07-10 DIAGNOSIS — I10 Essential (primary) hypertension: Secondary | ICD-10-CM | POA: Diagnosis not present

## 2018-07-10 DIAGNOSIS — M0589 Other rheumatoid arthritis with rheumatoid factor of multiple sites: Secondary | ICD-10-CM | POA: Diagnosis not present

## 2018-07-10 DIAGNOSIS — F331 Major depressive disorder, recurrent, moderate: Secondary | ICD-10-CM | POA: Diagnosis not present

## 2018-07-10 DIAGNOSIS — M810 Age-related osteoporosis without current pathological fracture: Secondary | ICD-10-CM | POA: Diagnosis not present

## 2018-07-10 DIAGNOSIS — M797 Fibromyalgia: Secondary | ICD-10-CM | POA: Diagnosis not present

## 2018-07-10 DIAGNOSIS — I959 Hypotension, unspecified: Secondary | ICD-10-CM | POA: Diagnosis not present

## 2018-07-10 DIAGNOSIS — R279 Unspecified lack of coordination: Secondary | ICD-10-CM | POA: Diagnosis not present

## 2018-07-10 DIAGNOSIS — E1140 Type 2 diabetes mellitus with diabetic neuropathy, unspecified: Secondary | ICD-10-CM | POA: Diagnosis not present

## 2018-07-10 DIAGNOSIS — M0689 Other specified rheumatoid arthritis, multiple sites: Secondary | ICD-10-CM | POA: Diagnosis not present

## 2018-07-10 LAB — GLUCOSE, CAPILLARY
GLUCOSE-CAPILLARY: 154 mg/dL — AB (ref 70–99)
Glucose-Capillary: 147 mg/dL — ABNORMAL HIGH (ref 70–99)

## 2018-07-10 MED ORDER — DOCUSATE SODIUM 100 MG PO CAPS: 100.0000 mg | ORAL_CAPSULE | Freq: Two times a day (BID) | ORAL | 0 refills | Status: DC

## 2018-07-10 MED ORDER — OXYCODONE HCL 5 MG PO TABS: 5.0000 mg | ORAL_TABLET | ORAL | 0 refills | Status: DC | PRN

## 2018-07-10 NOTE — Clinical Social Work Placement (Signed)
   CLINICAL SOCIAL WORK PLACEMENT  NOTE [facility]  RN to call report [phone]  Date:  07/10/2018  Patient Details  Name: Katherine Delgado MRN: [id number] Date of Birth: Jan 01, 1941  Clinical Social Work is seeking post-discharge placement for this patient at the [place] level of care (*CSW will initial, date and re-position this form in  chart as items are completed):  Yes   Patient/family provided with [place] Work Department's list of facilities offering this level of care within the geographic area requested by the patient (or if unable, by the patient's family).  Yes   Patient/family informed of their freedom to choose among providers that offer the needed level of care, that participate in Medicare, Medicaid or managed care program needed by the patient, have an available bed and are willing to accept the patient.  Yes   Patient/family informed of [facility]'s ownership interest in [facility] and [facility], as well as of the fact that they are under no obligation to receive care at these facilities.  PASRR submitted to EDS on 07/09/18     PASRR number received on 07/10/18     Existing PASRR number confirmed on       FL2 transmitted to all facilities in geographic area requested by pt/family on 07/09/18     FL2 transmitted to all facilities within larger geographic area on       Patient informed that his/her managed care company has contracts with or will negotiate with certain facilities, including the following:        Yes   Patient/family informed of bed offers received.  Patient chooses bed at [facility]     Physician recommends and patient chooses bed at      Patient to be transferred to [place] on 07/10/18.  Patient to be transferred to facility by PTAR     Patient family notified on 07/10/18 of transfer.  Name of family member notified:  pt son Ovid Curd     PHYSICIAN       Additional Comment:     _______________________________________________ Alexander Mt, [place] 07/10/2018, 12:53 PM

## 2018-07-10 NOTE — Discharge Summary (Addendum)
Discharge Summary  Date of Admission: 07/03/2018  Date of Discharge: 07/10/18  Attending Physician: Emelda Brothers, MD  Hospital Course: Patient was admitted following an uncomplicated G9-9 lumbar laminectomy. She was recovered in PACU and transferred to a regular nursing floor. She was evaluated by PT/OT, who recommended SNF placement. Her hospital course was uncomplicated and the patient was discharged to SNF on 07/10/18. She will follow up in clinic with me in 2 weeks.  Neurologic exam at discharge:  AOx3, PERRL, EOMI, FS, TM Strength 5/5 x4, SILTx4  Diet: Resume regular diet  Discharge Medications: Allergies as of 07/10/2018      Reactions   Influenza Vaccines Other (See Comments)   "h/o Guillain Barre; dr's told me years ago never to take another flu shot as it could relapse Rosalee Kaufman; has to do with when vaccine being changed to H1N1 virus"   Penicillins Hives, Itching, Swelling, Rash   Has patient had a PCN reaction causing immediate rash, facial/tongue/throat swelling, SOB or lightheadedness with hypotension:Unknown Has patient had a PCN reaction causing severe rash involving mucus membranes or skin necrosis: Unknown Has patient had a PCN reaction that required hospitalization:Unknown Has patient had a PCN reaction occurring within the last 10 years: Unknown If all of the above answers are "NO", then may proceed with Cephalosporin use.   Statins Other (See Comments)   Myopathy, transaminitis   Sulfamethoxazole-trimethoprim Itching      Medication List    STOP taking these medications   HYDROcodone-acetaminophen 10-325 MG tablet Commonly known as:  NORCO     TAKE these medications   ACCU-CHEK SMARTVIEW test strip Generic drug:  glucose blood Check blood sugar 3-4 times daily   albuterol 108 (90 Base) MCG/ACT inhaler Commonly known as:  PROVENTIL HFA;VENTOLIN HFA Inhale 2 puffs into the lungs every 6 (six) hours as needed for wheezing or shortness of  breath.   amLODipine 10 MG tablet Commonly known as:  NORVASC Take 1 tablet (10 mg total) by mouth daily. Please schedule appointment for refills.   B-D UF III MINI PEN NEEDLES 31G X 5 MM Misc Generic drug:  Insulin Pen Needle USE AS DIRECTED   cadexomer iodine 0.9 % gel Commonly known as:  IODOSORB Apply 1 application topically daily as needed for wound care.   docusate sodium 100 MG capsule Commonly known as:  COLACE Take 1 capsule (100 mg total) by mouth 2 (two) times daily.   folic acid 1 MG tablet Commonly known as:  FOLVITE Take 2 tablets (2 mg total) by mouth daily.   furosemide 20 MG tablet Commonly known as:  LASIX TAKE 1 TABLET BY MOUTH TWICE DAILY AS NEEDED FOR 3 POUND WEIGHT GAIN IN ONE DAY OR 5 POUNDS IN ONE WEEK What changed:  See the new instructions.   gabapentin 600 MG tablet Commonly known as:  NEURONTIN TAKE 1 TABLET BY MOUTH THREE TIMES DAILY What changed:  when to take this   HUMALOG KWIKPEN 200 UNIT/ML Sopn Generic drug:  Insulin Lispro INJECT 20 UNITS UNDER THE SKIN THREE TIMES DAILY BEFORE A MEAL What changed:  See the new instructions.   levothyroxine 137 MCG tablet Commonly known as:  SYNTHROID, LEVOTHROID TAKE 1 TABLET BY MOUTH DAILY BEFORE BREAKFAST ON AN EMPTY STOMACH( LABS OVERDUE) What changed:  See the new instructions.   lisinopril 20 MG tablet Commonly known as:  PRINIVIL,ZESTRIL Take 1 tablet (20 mg total) by mouth daily. Please schedule appointment for refills.   loperamide 2 MG tablet Commonly  known as:  IMODIUM A-D Take 2 mg by mouth 4 (four) times daily as needed for diarrhea or loose stools.   magnesium oxide 400 MG tablet Commonly known as:  MAG-OX Take 400 mg by mouth daily as needed (for leg cramps).   Melatonin 10 MG Caps Take 10 mg by mouth at bedtime.   metoprolol tartrate 50 MG tablet Commonly known as:  LOPRESSOR Take 50 mg by mouth 2 (two) times daily.   MYRBETRIQ 50 MG Tb24 tablet Generic drug:  mirabegron  ER TAKE 1 TABLET(50 MG) BY MOUTH DAILY What changed:  See the new instructions.   nystatin powder Commonly known as:  MYCOSTATIN/NYSTOP Apply topically daily as needed. What changed:    how much to take  reasons to take this   nystatin cream Commonly known as:  MYCOSTATIN Apply 1 application topically 2 (two) times daily. To skin folds What changed:    when to take this  reasons to take this  additional instructions   omega-3 acid ethyl esters 1 g capsule Commonly known as:  LOVAZA TAKE ONE CAPSULE BY MOUTH EVERY DAY   oxyCODONE 5 MG immediate release tablet Commonly known as:  Oxy IR/ROXICODONE Take 1 tablet (5 mg total) by mouth every 4 (four) hours as needed (pain). What changed:    how much to take  how to take this  when to take this  reasons to take this  additional instructions   polyvinyl alcohol 1.4 % ophthalmic solution Commonly known as:  LIQUIFILM TEARS Place 1 drop into both eyes 4 (four) times daily as needed (for dry/irritated eyes).   potassium chloride 10 MEQ CR capsule Commonly known as:  MICRO-K Take 20 mEq by mouth 2 (two) times daily as needed (when taking a dose of lasix.).   pregabalin 150 MG capsule Commonly known as:  LYRICA Take 1 capsule (150 mg total) by mouth 2 (two) times daily.   tiZANidine 2 MG tablet Commonly known as:  ZANAFLEX Take 1 tablet (2 mg total) by mouth 3 (three) times daily.   Tofacitinib Citrate 5 MG Tabs Take 1 tablet (5 mg total) by mouth 2 (two) times daily.   TOUJEO SOLOSTAR 300 UNIT/ML Sopn Generic drug:  Insulin Glargine (1 Unit Dial) INJECT 100 UNITS UNDER THE SKIN EVERY NIGHT AT BEDTIME What changed:  See the new instructions.   triamterene-hydrochlorothiazide 37.5-25 MG tablet Commonly known as:  MAXZIDE-25 TAKE 1 TABLET BY MOUTH DAILY   umeclidinium-vilanterol 62.5-25 MCG/INH Aepb Commonly known as:  ANORO ELLIPTA Inhale 1 puff into the lungs daily.   venlafaxine XR 75 MG 24 hr  capsule Commonly known as:  EFFEXOR-XR TAKE 1 CAPSULE(75 MG) BY MOUTH DAILY WITH BREAKFAST   VICTOZA 18 MG/3ML Sopn Generic drug:  liraglutide ADMINISTER 1.8 MG UNDER THE SKIN DAILY FOR BLOOD SUGAR What changed:  See the new instructions.        Judith Part, MD 07/10/18 12:36 PM

## 2018-07-10 NOTE — Progress Notes (Addendum)
Neurosurgery Service Progress Note  Subjective: No acute events overnight, sitting up in a chair today, feels much better, pain improved significantly  Objective: Vitals:   07/09/18 2355 07/10/18 0436 07/10/18 0730 07/10/18 1152  BP: (!) 125/91 (!) 128/51 135/77 134/60  Pulse: 72 63 69 (!) 55  Resp:      Temp: 97.8 F (36.6 C) 97.9 F (36.6 C) 98.4 F (36.9 C) 98.1 F (36.7 C)  TempSrc: Oral Oral Oral Oral  SpO2: 94% 92% 94% 97%  Weight:      Height:       Temp (24hrs), Avg:98 F (36.7 C), Min:97.8 F (36.6 C), Max:98.4 F (36.9 C)  CBC Latest Ref Rng & Units 07/03/2018 04/06/2018 03/06/2018  WBC 4.0 - 10.5 K/uL 14.7(H) 14.5(H) 16.6(H)  Hemoglobin 12.0 - 15.0 g/dL 13.2 13.2 13.7  Hematocrit 36.0 - 46.0 % 44.2 39.7 42.1  Platelets 150 - 400 K/uL 348 346 307   BMP Latest Ref Rng & Units 07/03/2018 04/06/2018 01/22/2018  Glucose 70 - 99 mg/dL 194(H) 102(H) 198(H)  BUN 8 - 23 mg/dL 23 18 14   Creatinine 0.44 - 1.00 mg/dL 0.98 0.93 0.97(H)  BUN/Creat Ratio 6 - 22 (calc) - NOT APPLICABLE 14  Sodium 993 - 145 mmol/L 137 141 139  Potassium 3.5 - 5.1 mmol/L 3.9 4.8 4.6  Chloride 98 - 111 mmol/L 100 103 101  CO2 22 - 32 mmol/L 29 31 30   Calcium 8.9 - 10.3 mg/dL 9.4 9.1 9.7    Intake/Output Summary (Last 24 hours) at 07/10/2018 1232 Last data filed at 07/10/2018 0900 Gross per 24 hour  Intake 243 ml  Output 2475 ml  Net -2232 ml    Current Facility-Administered Medications:  .  0.9 %  sodium chloride infusion, 250 mL, Intravenous, Continuous, Smera Guyette A, MD, Last Rate: 1 mL/hr at 07/03/18 1821, 250 mL at 07/03/18 1821 .  acetaminophen (TYLENOL) tablet 650 mg, 650 mg, Oral, Q4H PRN, 650 mg at 07/07/18 0903 **OR** acetaminophen (TYLENOL) suppository 650 mg, 650 mg, Rectal, Q4H PRN, Kale Rondeau A, MD .  albuterol (PROVENTIL) (2.5 MG/3ML) 0.083% nebulizer solution 3 mL, 3 mL, Inhalation, Q6H PRN, Judith Part, MD .  cyclobenzaprine (FLEXERIL) tablet 10 mg, 10  mg, Oral, TID PRN, Judith Part, MD, 10 mg at 07/09/18 1204 .  docusate sodium (COLACE) capsule 100 mg, 100 mg, Oral, BID, Latayna Ritchie A, MD, 100 mg at 07/10/18 0900 .  gabapentin (NEURONTIN) tablet 600 mg, 600 mg, Oral, QPM, Carson Bogden A, MD, 600 mg at 07/09/18 1738 .  heparin injection 5,000 Units, 5,000 Units, Subcutaneous, Q8H, Wendee Hata, Joyice Faster, MD, 5,000 Units at 07/10/18 0532 .  HYDROmorphone (DILAUDID) injection 0.5 mg, 0.5 mg, Intravenous, Q2H PRN, Judith Part, MD, 0.5 mg at 07/04/18 1129 .  insulin aspart (novoLOG) injection 0-15 Units, 0-15 Units, Subcutaneous, TID WC, Judith Part, MD, 3 Units at 07/10/18 [phone] .  insulin aspart (novoLOG) injection 10 Units, 10 Units, Subcutaneous, TID WC, Judith Part, MD, 10 Units at 07/10/18 [phone] .  insulin glargine (LANTUS) injection 100 Units, 100 Units, Subcutaneous, QHS, Judith Part, MD, 100 Units at 07/09/18 2358 .  levothyroxine (SYNTHROID, LEVOTHROID) tablet 137 mcg, 137 mcg, Oral, QAC breakfast, Judith Part, MD, 137 mcg at 07/10/18 [phone] .  lisinopril (PRINIVIL,ZESTRIL) tablet 20 mg, 20 mg, Oral, Daily, Aniyha Tate A, MD, 20 mg at 07/10/18 0900 .  Melatonin TABS 9 mg, 9 mg, Oral, QHS, Akiah Bauch, Joyice Faster, MD, 9 mg  at 07/09/18 2357 .  menthol-cetylpyridinium (CEPACOL) lozenge 3 mg, 1 lozenge, Oral, PRN **OR** phenol (CHLORASEPTIC) mouth spray 1 spray, 1 spray, Mouth/Throat, PRN, Yadier Bramhall A, MD .  metoprolol tartrate (LOPRESSOR) tablet 50 mg, 50 mg, Oral, BID, Adriano Bischof A, MD, 50 mg at 07/10/18 0900 .  mirabegron ER (MYRBETRIQ) tablet 50 mg, 50 mg, Oral, Daily, Seth Friedlander A, MD, 50 mg at 07/10/18 0900 .  nystatin (MYCOSTATIN/NYSTOP) topical powder 1 g, 1 g, Topical, Daily PRN, Demar Shad A, MD .  nystatin cream (MYCOSTATIN) 1 application, 1 application, Topical, BID PRN, Judith Part, MD, 1 application at 09/73/53 [phone] .  ondansetron (ZOFRAN)  tablet 4 mg, 4 mg, Oral, Q6H PRN **OR** ondansetron (ZOFRAN) injection 4 mg, 4 mg, Intravenous, Q6H PRN, Bradlee Bridgers A, MD .  oxyCODONE (Oxy IR/ROXICODONE) immediate release tablet 10 mg, 10 mg, Oral, Q4H PRN, Judith Part, MD, 10 mg at 07/09/18 1738 .  oxyCODONE (Oxy IR/ROXICODONE) immediate release tablet 5 mg, 5 mg, Oral, Q4H PRN, Judith Part, MD, 5 mg at 07/08/18 2057 .  polyethylene glycol (MIRALAX / GLYCOLAX) packet 17 g, 17 g, Oral, Daily PRN, Judith Part, MD, 17 g at 07/07/18 1939 .  polyvinyl alcohol (LIQUIFILM TEARS) 1.4 % ophthalmic solution 1 drop, 1 drop, Both Eyes, QID PRN, Reylene Stauder A, MD .  pregabalin (LYRICA) capsule 150 mg, 150 mg, Oral, BID, Wylie Russon A, MD, 150 mg at 07/10/18 0900 .  sodium chloride flush (NS) 0.9 % injection 3 mL, 3 mL, Intravenous, Q12H, Laquana Villari A, MD, 3 mL at 07/10/18 0900 .  sodium chloride flush (NS) 0.9 % injection 3 mL, 3 mL, Intravenous, PRN, Bernhardt Riemenschneider A, MD .  tiZANidine (ZANAFLEX) tablet 2 mg, 2 mg, Oral, TID, Derriana Oser, Joyice Faster, MD, 2 mg at 07/10/18 0900 .  triamterene-hydrochlorothiazide (MAXZIDE-25) 37.5-25 MG per tablet 1 tablet, 1 tablet, Oral, Daily, Zienna Ahlin, Joyice Faster, MD, 1 tablet at 07/10/18 0900 .  umeclidinium-vilanterol (ANORO ELLIPTA) 62.5-25 MCG/INH 1 puff, 1 puff, Inhalation, Daily, Judith Part, MD, 1 puff at 07/10/18 0905 .  venlafaxine XR (EFFEXOR-XR) 24 hr capsule 75 mg, 75 mg, Oral, Q breakfast, Judith Part, MD, 75 mg at 07/10/18 4268   Physical Exam: Awake/alert, strength 5/5 x4, SILTx4 Incision c/d/i  Assessment & Plan: 77 y.o. woman s/p lumbar laminectomies, recovering well. -PT/OT rec SNF, awaiting placement, will pend discharge order in case bed becomes available at SNF, pt's Rx are in paper chart -SCDs/TEDs, SQH  Judith Part  07/10/18 12:32 PM

## 2018-07-10 NOTE — Discharge Instructions (Signed)
Discharge Instructions ° °No restriction in activities, slowly increase your activity back to normal.  ° °Your incision is closed with dermabond (purple glue). This will naturally fall off over the next 1-2 weeks.  ° °Okay to shower on the day of discharge. Use regular soap and water and try to be gentle when cleaning your incision.  ° °Follow up with Dr. Lorenda Grecco in 2 weeks after discharge. If you do not already have a discharge appointment, please call his office at [phone] to schedule a follow up appointment. If you have any concerns or questions, please call the office and let us know. °

## 2018-07-10 NOTE — Social Work (Signed)
CSW has received Katherine Delgado- [phone]   [facility] able to accept pt today, paged MD for discharge medications and diet recommendations on discharge summary for SNF. When updated will facilitate discharge via PTAR.   Westley Hummer, MSW, [place] Work [phone]

## 2018-07-10 NOTE — Progress Notes (Signed)
Report called to [facility] at [facility], IV removed.

## 2018-07-10 NOTE — Progress Notes (Signed)
Physical Therapy Treatment Patient Details Name: Katherine Delgado MRN: [id number] DOB: 1940-10-05 Today's Date: 07/10/2018    History of Present Illness 77 y.o. female admitted on 07/03/18 for elective L3-5 lumbar leaminectomies.  Pt with significant PMH of DM, thyroid disease, RA, osteoporosis, morbid obesity, HTN, Guillian Barre syndrome, fibromyalgia, COPD, bil knee surgery, back surgery (8144).      PT Comments    Pt with improved activity tolerance and gait today with ability to walk 3 trials with RW and chair follow. Pt with 8/10 RLE pain but improved strength and stability from prior session. Encouraged mobility with nursing and continued HEP during the day. Pt educated for all back precautions.     Follow Up Recommendations  SNF;Supervision/Assistance - 24 hour     Equipment Recommendations  None recommended by PT    Recommendations for Other Services       Precautions / Restrictions Precautions Precautions: Fall;Back Precaution Comments: reviewed precautions with pt Restrictions Weight Bearing Restrictions: No    Mobility  Bed Mobility Overal bed mobility: Needs Assistance Bed Mobility: Rolling;Sidelying to Sit Rolling: Min assist Sidelying to sit: Min assist       General bed mobility comments: cues for sequence with use of rail and able to bring legs off of bed on her own, min assist to elevate trunk   Transfers Overall transfer level: Needs assistance   Transfers: Sit to/from Stand Sit to Stand: Min assist;+2 safety/equipment;From elevated surface         General transfer comment: min assist to stand from bed x 2 trials and from chair x 2 trials, cues for hand placement and safety  Ambulation/Gait Ambulation/Gait assistance: Min assist;+2 safety/equipment Gait Distance (Feet): 36 Feet Assistive device: Rolling walker (2 wheeled) Gait Pattern/deviations: Step-through pattern;Decreased stride length;Trunk flexed   Gait velocity interpretation: 1.31 -  2.62 ft/sec, indicative of limited community ambulator General Gait Details: cues for posture, position in RW safety and chair follow. Pt walked 36', 24' , 22' with seated rest after each trial   Stairs             Wheelchair Mobility    Modified Rankin (Stroke Patients Only)       Balance Overall balance assessment: Needs assistance   Sitting balance-Leahy Scale: Fair     Standing balance support: Bilateral upper extremity supported Standing balance-Leahy Scale: Poor                              Cognition Arousal/Alertness: Awake/alert Behavior During Therapy: WFL for tasks assessed/performed Overall Cognitive Status: Impaired/Different from baseline                         Following Commands: Follows one step commands consistently Safety/Judgement: Decreased awareness of deficits            Exercises General Exercises - Lower Extremity Long Arc Quad: AROM;Both;15 reps;Seated Hip Flexion/Marching: Seated;15 reps;AROM;Both    General Comments        Pertinent Vitals/Pain Pain Location: 5/10 back pain, 8/10 RLE pain Pain Descriptors / Indicators: Aching;Sore Pain Intervention(s): Limited activity within patient's tolerance;Monitored during session;Premedicated before session;Repositioned    Home Living                      Prior Function            PT Goals (current goals can now be found in the care  plan section) Progress towards PT goals: Progressing toward goals    Frequency           PT Plan Current plan remains appropriate    Co-evaluation              AM-PAC PT "6 Clicks" Daily Activity  Outcome Measure  Difficulty turning over in bed (including adjusting bedclothes, sheets and blankets)?: Unable Difficulty moving from lying on back to sitting on the side of the bed? : Unable Difficulty sitting down on and standing up from a chair with arms (e.g., wheelchair, bedside commode, etc,.)?:  Unable Help needed moving to and from a bed to chair (including a wheelchair)?: A Lot Help needed walking in hospital room?: A Lot Help needed climbing 3-5 steps with a railing? : Total 6 Click Score: 8    End of Session Equipment Utilized During Treatment: Gait belt Activity Tolerance: Patient limited by fatigue Patient left: in chair;with call bell/phone within reach;with chair alarm set Nurse Communication: Mobility status;Precautions PT Visit Diagnosis: Difficulty in walking, not elsewhere classified (R26.2);Muscle weakness (generalized) (M62.81);Pain;Other symptoms and signs involving the nervous system (R29.898)     Time: 3888-2800 PT Time Calculation (min) (ACUTE ONLY): 27 min  Charges:  $Gait Training: 8-22 mins $Therapeutic Activity: 8-22 mins                     [place], PT Acute Rehabilitation Services Pager: [phone] Office: [place] 07/10/2018, 10:52 AM

## 2018-07-10 NOTE — Social Work (Signed)
Clinical Social Worker facilitated patient discharge including contacting patient family and facility to confirm patient discharge plans.  Clinical information faxed to facility and family agreeable with plan.  CSW arranged ambulance transport via [facility] to [place]. RN to call [phone]  with report prior to discharge.  Clinical Social Worker will sign off for now as social work intervention is no longer needed. Please consult [place] again if new need arises.  Alexander Mt, [place] Social Worker [phone]

## 2018-07-11 DIAGNOSIS — M0689 Other specified rheumatoid arthritis, multiple sites: Secondary | ICD-10-CM | POA: Diagnosis not present

## 2018-07-11 NOTE — Consult Note (Signed)
            [facility] CM Primary Care Navigator  07/11/2018  Katherine Delgado [facility] [address] 1941/04/14 [id number]   Attempt to see patientat the bedsideto identify possible discharge needs butshe wasalready discharge perstaff.  Per chart review, patientwas discharged to skilled nursing facilityfor rehabilitationper therapy recommendation.([facility]NF- [facility]).  Per MD note, patient progressively had worsening low back and leg pain with lost of ability to ambulate and has been wheelchair bound due to pain. Admitted for lumbar laminectomy (severe lumbar stenosis, status post L3- L5 lumbar laminectomy)  Primary care provider's office is listed as providing transition of care (TOC) follow-up.  Patient has discharge instruction to follow-up with neurosurgery in 2 weeks and primary care provider follow-up post discharge.   For additional questions please contact:  Edwena Felty A. Calianne Larue, BSN, RN-BC [facility] PRIMARY CARE Navigator Cell: [phone]

## 2018-07-13 DIAGNOSIS — J438 Other emphysema: Secondary | ICD-10-CM | POA: Diagnosis not present

## 2018-07-13 DIAGNOSIS — E119 Type 2 diabetes mellitus without complications: Secondary | ICD-10-CM | POA: Diagnosis not present

## 2018-07-13 DIAGNOSIS — Z794 Long term (current) use of insulin: Secondary | ICD-10-CM | POA: Diagnosis not present

## 2018-07-16 DIAGNOSIS — R5381 Other malaise: Secondary | ICD-10-CM | POA: Diagnosis not present

## 2018-07-16 DIAGNOSIS — E119 Type 2 diabetes mellitus without complications: Secondary | ICD-10-CM | POA: Diagnosis not present

## 2018-07-18 DIAGNOSIS — E119 Type 2 diabetes mellitus without complications: Secondary | ICD-10-CM | POA: Diagnosis not present

## 2018-07-18 DIAGNOSIS — Z794 Long term (current) use of insulin: Secondary | ICD-10-CM | POA: Diagnosis not present

## 2018-07-18 DIAGNOSIS — M0689 Other specified rheumatoid arthritis, multiple sites: Secondary | ICD-10-CM | POA: Diagnosis not present

## 2018-07-23 DIAGNOSIS — S31000D Unspecified open wound of lower back and pelvis without penetration into retroperitoneum, subsequent encounter: Secondary | ICD-10-CM | POA: Diagnosis not present

## 2018-07-23 DIAGNOSIS — M0689 Other specified rheumatoid arthritis, multiple sites: Secondary | ICD-10-CM | POA: Diagnosis not present

## 2018-07-23 DIAGNOSIS — Z794 Long term (current) use of insulin: Secondary | ICD-10-CM | POA: Diagnosis not present

## 2018-07-25 ENCOUNTER — Telehealth: Payer: Self-pay | Admitting: *Deleted

## 2018-07-25 DIAGNOSIS — G8929 Other chronic pain: Secondary | ICD-10-CM

## 2018-07-25 DIAGNOSIS — M545 Low back pain, unspecified: Secondary | ICD-10-CM

## 2018-07-25 NOTE — Telephone Encounter (Signed)
Let's call his office and find out if it was meant to be discontinued.  If so, we should not refill it.

## 2018-07-25 NOTE — Telephone Encounter (Signed)
Patient called and left message on Clinical intake stating that she wants a refill on her Hydrocodone 10/325. Reviewed medication list and medication was discontinued. Reviewed Discharge Summary dated 07/10/18 and medication was DISCONTINUED by Dr. Emelda Brothers, Neurosurgery.   Please Advise.   (Tried calling patient, NA) Has follow up appointment scheduled for 08/09/18.

## 2018-07-25 NOTE — Telephone Encounter (Signed)
[facility]-Dr. Emelda Brothers [phone]  Caryl Pina, MA Stated that he had prescribed the Oxycodone after surgery and took her off of the Hydrocodone but she is going to ask him if he means for her to stay off of the Hydrocodone and call me back. Awaiting call.

## 2018-07-26 ENCOUNTER — Other Ambulatory Visit: Payer: Self-pay | Admitting: *Deleted

## 2018-07-26 MED ORDER — VENLAFAXINE HCL ER 75 MG PO CP24: ORAL_CAPSULE | ORAL | 0 refills | Status: DC

## 2018-07-26 NOTE — Telephone Encounter (Signed)
[facility] 

## 2018-07-26 NOTE — Telephone Encounter (Signed)
Katherine Delgado with Dr. Emelda Brothers office called back and stated that Dr. Zada Finders is fine with patient continuing the Hydrocodone. Stated that he is weaning her off of the Oxycodone from her surgery.   Please Advise.   Katherine Delgado [phone]

## 2018-07-26 NOTE — Telephone Encounter (Signed)
Ok, let's fill it then and I agree with her coming off oxycodone.  She's on too many different drugs.

## 2018-07-27 MED ORDER — HYDROCODONE-ACETAMINOPHEN 10-325 MG PO TABS: 1.0000 | ORAL_TABLET | ORAL | 0 refills | Status: DC | PRN

## 2018-07-27 NOTE — Telephone Encounter (Signed)
Last filled 06/21/18

## 2018-07-30 DIAGNOSIS — S31000D Unspecified open wound of lower back and pelvis without penetration into retroperitoneum, subsequent encounter: Secondary | ICD-10-CM | POA: Diagnosis not present

## 2018-07-30 DIAGNOSIS — R5381 Other malaise: Secondary | ICD-10-CM | POA: Diagnosis not present

## 2018-07-30 DIAGNOSIS — E119 Type 2 diabetes mellitus without complications: Secondary | ICD-10-CM | POA: Diagnosis not present

## 2018-08-02 ENCOUNTER — Telehealth: Payer: Self-pay | Admitting: *Deleted

## 2018-08-02 DIAGNOSIS — Z794 Long term (current) use of insulin: Secondary | ICD-10-CM | POA: Diagnosis not present

## 2018-08-02 DIAGNOSIS — I10 Essential (primary) hypertension: Secondary | ICD-10-CM | POA: Diagnosis not present

## 2018-08-02 DIAGNOSIS — L89892 Pressure ulcer of other site, stage 2: Secondary | ICD-10-CM | POA: Diagnosis not present

## 2018-08-02 DIAGNOSIS — E1140 Type 2 diabetes mellitus with diabetic neuropathy, unspecified: Secondary | ICD-10-CM | POA: Diagnosis not present

## 2018-08-02 DIAGNOSIS — T8140XD Infection following a procedure, unspecified, subsequent encounter: Secondary | ICD-10-CM | POA: Diagnosis not present

## 2018-08-02 DIAGNOSIS — R2689 Other abnormalities of gait and mobility: Secondary | ICD-10-CM | POA: Diagnosis not present

## 2018-08-02 DIAGNOSIS — F331 Major depressive disorder, recurrent, moderate: Secondary | ICD-10-CM | POA: Diagnosis not present

## 2018-08-02 DIAGNOSIS — R110 Nausea: Secondary | ICD-10-CM

## 2018-08-02 DIAGNOSIS — M0589 Other rheumatoid arthritis with rheumatoid factor of multiple sites: Secondary | ICD-10-CM | POA: Diagnosis not present

## 2018-08-02 MED ORDER — ONDANSETRON HCL 4 MG PO TABS: 4.0000 mg | ORAL_TABLET | Freq: Three times a day (TID) | ORAL | 0 refills | Status: DC | PRN

## 2018-08-02 NOTE — Telephone Encounter (Signed)
Patient notified and agreed.  

## 2018-08-02 NOTE — Telephone Encounter (Signed)
Sent 20 tabs to her [facility] of zofran which is safer than phenergan with all of the other meds she takes.  If her symptoms persist, she gets a fever, or she's unable to take oral intake she should be evaluated.

## 2018-08-02 NOTE — Telephone Encounter (Signed)
Patient called requesting Phenergan called into pharmacy due to Nausea. Stated that she had the medication in the past.   Patient stated that she doesn't know if its a bug or combination of medications that is making her nauseated. Touch of diarrhea but not much.  No Vomiting. No fever. No other symptoms but nausea. Stated that this started yesterday morning. Came out of rehab on Monday. Would like some Phenergan sent to her pharmacy.   No available appointment till Tuesday with Janett Billow. Please Advise.

## 2018-08-03 DIAGNOSIS — L89892 Pressure ulcer of other site, stage 2: Secondary | ICD-10-CM | POA: Diagnosis not present

## 2018-08-03 DIAGNOSIS — F331 Major depressive disorder, recurrent, moderate: Secondary | ICD-10-CM | POA: Diagnosis not present

## 2018-08-03 DIAGNOSIS — I10 Essential (primary) hypertension: Secondary | ICD-10-CM | POA: Diagnosis not present

## 2018-08-03 DIAGNOSIS — T8140XD Infection following a procedure, unspecified, subsequent encounter: Secondary | ICD-10-CM | POA: Diagnosis not present

## 2018-08-03 DIAGNOSIS — R2689 Other abnormalities of gait and mobility: Secondary | ICD-10-CM | POA: Diagnosis not present

## 2018-08-03 DIAGNOSIS — E1140 Type 2 diabetes mellitus with diabetic neuropathy, unspecified: Secondary | ICD-10-CM | POA: Diagnosis not present

## 2018-08-06 ENCOUNTER — Encounter: Payer: Self-pay | Admitting: Neurology

## 2018-08-06 ENCOUNTER — Ambulatory Visit (INDEPENDENT_AMBULATORY_CARE_PROVIDER_SITE_OTHER): Payer: Medicare Other | Admitting: Neurology

## 2018-08-06 VITALS — BP 132/68 | HR 64 | Ht 66.0 in | Wt 247.0 lb

## 2018-08-06 DIAGNOSIS — R269 Unspecified abnormalities of gait and mobility: Secondary | ICD-10-CM | POA: Diagnosis not present

## 2018-08-06 DIAGNOSIS — E1142 Type 2 diabetes mellitus with diabetic polyneuropathy: Secondary | ICD-10-CM | POA: Diagnosis not present

## 2018-08-06 DIAGNOSIS — R2689 Other abnormalities of gait and mobility: Secondary | ICD-10-CM | POA: Diagnosis not present

## 2018-08-06 DIAGNOSIS — E1140 Type 2 diabetes mellitus with diabetic neuropathy, unspecified: Secondary | ICD-10-CM | POA: Diagnosis not present

## 2018-08-06 DIAGNOSIS — F331 Major depressive disorder, recurrent, moderate: Secondary | ICD-10-CM | POA: Diagnosis not present

## 2018-08-06 DIAGNOSIS — L89892 Pressure ulcer of other site, stage 2: Secondary | ICD-10-CM | POA: Diagnosis not present

## 2018-08-06 DIAGNOSIS — I10 Essential (primary) hypertension: Secondary | ICD-10-CM | POA: Diagnosis not present

## 2018-08-06 DIAGNOSIS — T8140XD Infection following a procedure, unspecified, subsequent encounter: Secondary | ICD-10-CM | POA: Diagnosis not present

## 2018-08-06 NOTE — Progress Notes (Signed)
Reason for visit: Lumbar spinal stenosis, peripheral neuropathy  Katherine Delgado is an 77 y.o. female  History of present illness:  Katherine Delgado is a 77 year old right-handed white female with a history of peripheral neuropathy and a history of rheumatoid arthritis.  She has degenerative arthritis of the right knee that is now the limiting factor in her ability to ambulate.  She is followed by Dr. Estanislado Pandy, she was on Morrie Sheldon, but she has been taken off of this.  The patient was found to have severe spinal stenosis at 2 levels, she underwent a lumbar laminectomy at the L3-L5 levels.  The patient has had resolution of her low back pain but she is having difficulty with a wound infection and slow healing of the wound.  She has home health nursing, Occupational Therapy, and physical therapy in the home.  She did require some therapy through [facility].  The patient is walking with a walker, she can walk about 30 feet, she has severe right knee pain with weightbearing.  She has not been seen through an orthopedic surgeon for this, she has had arthroscopic knee surgery bilaterally in the past.  The patient returns to this office for an evaluation.  The patient continues to have bladder incontinence.  Past Medical History:  Diagnosis Date  . Abnormal weight gain   . Abnormality of gait 04/19/2016  . Acute infective polyneuritis ([place]) 2002  . Acute maxillary sinusitis   . Acute sinusitis, unspecified   . Allergic rhinitis due to pollen   . Arthritis   . Back injury   . Candidiasis of skin and nails   . Candidiasis of vulva and vagina   . Chronic pain syndrome   . COPD (chronic obstructive pulmonary disease) ([place])   . Cough   . Degenerative arthritis   . Depressive disorder, not elsewhere classified   . Diabetes mellitus without complication ([place])   . Diaphragmatic hernia without mention of obstruction or gangrene   . Dyslipidemia   . Edema   . Fibromyalgia   . GERD  (gastroesophageal reflux disease)   . Guillain-Barre syndrome ([place])   . History of benign thymus tumor   . Hypertension   . Insomnia, unspecified   . Lumbago   . Lumbar spinal stenosis 03/30/2018   L4-5 level, severe  . Miscarriage 1962  . Mixed hyperlipidemia   . Morbid obesity ([place])   . Obstructive chronic bronchitis with acute bronchitis ([place])   . Osteopenia, senile   . Osteoporosis   . Other malaise and fatigue   . Other specified disease of white blood cells   . Pain in joint, multiple sites   . Pneumonia   . Polyneuropathy in diabetes(357.2)   . RA (rheumatoid arthritis) ([place])   . Reflux esophagitis   . Rheumatoid arthritis(714.0)   . Shortness of breath   . Spinal stenosis, lumbar region, without neurogenic claudication   . Spondylosis, lumbosacral   . Spontaneous ecchymoses   . Stiffness of joints, not elsewhere classified, multiple sites   . Tear film insufficiency, unspecified   . Thyroid disease   . Thyroid disorder   . Type II or unspecified type diabetes mellitus with peripheral circulatory disorders, uncontrolled(250.72)   . Unspecified chronic bronchitis ([place])   . Unspecified essential hypertension   . Unspecified hypothyroidism   . Unspecified pruritic disorder   . Unspecified urinary incontinence   . Urinary tract infection, site not specified     Past Surgical History:  Procedure  Laterality Date  . ABDOMINAL HYSTERECTOMY  1974  . abdominal tumor  2002  . APPENDECTOMY    . [place]  . BRONCHOSCOPY  2001  . CHOLECYSTECTOMY  1984  . KNEE SURGERY Bilateral 08/27/2010 (L) and 01/11/2011 (R)  . LUMBAR LAMINECTOMY/DECOMPRESSION MICRODISCECTOMY N/A 07/03/2018   Procedure: Lumbar Three to Lumbar Five Laminectomy;  Surgeon: Judith Part, MD;  Location: [facility];  Service: Neurosurgery;  Laterality: N/A;  . miscarrage  1962  . OVARIAN CYST SURGERY  1968  . thymus tumor    . 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 TONSILLECTOMY      Family History  Problem  Relation Age of Onset  . Alzheimer's disease Mother   . Heart disease Mother   . Heart disease Father   . Liver disease Father   . Cancer Brother   . Arthritis Son   . Colon polyps Brother   . Colon cancer Neg Hx   . Esophageal cancer Neg Hx   . Kidney disease Neg Hx   . Stomach cancer Neg Hx   . Rectal cancer Neg Hx     Social history:  reports that she quit smoking about 38 years ago. Her smoking use included cigarettes. She has never used smokeless tobacco. She reports that she does not drink alcohol or use drugs.    Allergies  Allergen Reactions  . Influenza Vaccines Other (See Comments)    "h/o Guillain Barre; dr's told me years ago never to take another flu shot as it could relapse Rosalee Kaufman; has to do with when vaccine being changed to H1N1 virus"  . Penicillins Hives, Itching, Swelling and Rash    Has patient had a PCN reaction causing immediate rash, facial/tongue/throat swelling, SOB or lightheadedness with hypotension:Unknown Has patient had a PCN reaction causing severe rash involving mucus membranes or skin necrosis: Unknown Has patient had a PCN reaction that required hospitalization:Unknown Has patient had a PCN reaction occurring within the last 10 years: Unknown If all of the above answers are "NO", then may proceed with Cephalosporin use.   . Statins Other (See Comments)    Myopathy, transaminitis  . Sulfamethoxazole-Trimethoprim Itching    Medications:  Prior to Admission medications   Medication Sig Start Date End Date Taking? Authorizing Provider  albuterol (PROVENTIL HFA;VENTOLIN HFA) 108 (90 Base) MCG/ACT inhaler Inhale 2 puffs into the lungs every 6 (six) hours as needed for wheezing or shortness of breath. 06/05/17  Yes Reed, Tiffany L, DO  amLODipine (NORVASC) 10 MG tablet Take 1 tablet (10 mg total) by mouth daily. Please schedule appointment for refills. 03/06/18  Yes Gildardo Cranker, DO  B-D UF III MINI PEN NEEDLES 31G X 5 MM MISC USE AS DIRECTED  12/18/17  Yes Reed, Tiffany L, DO  cadexomer iodine (IODOSORB) 0.9 % gel Apply 1 application topically daily as needed for wound care. 03/07/18  Yes Regal, Tamala Fothergill, DPM  docusate sodium (COLACE) 100 MG capsule Take 1 capsule (100 mg total) by mouth 2 (two) times daily. 07/10/18  Yes Judith Part, MD  folic acid (FOLVITE) 1 MG tablet Take 2 tablets (2 mg total) by mouth daily. 11/15/16  Yes Panwala, Naitik, PA-C  furosemide (LASIX) 20 MG tablet TAKE 1 TABLET BY MOUTH TWICE DAILY AS NEEDED FOR 3 POUND WEIGHT GAIN IN ONE DAY OR 5 POUNDS IN ONE WEEK Patient taking differently: Take 20 mg by mouth 2 (two) times daily as needed (for fluid retention/weight gain 3 lbs in a  day or 5 lbs in a week.).  08/18/17  Yes Reed, Tiffany L, DO  gabapentin (NEURONTIN) 600 MG tablet TAKE 1 TABLET BY MOUTH THREE TIMES DAILY Patient taking differently: Take 600 mg by mouth every evening.  03/05/18  Yes Reed, Tiffany L, DO  glucose blood (ACCU-CHEK SMARTVIEW) test strip Check blood sugar 3-4 times daily   Yes [provider]  HUMALOG KWIKPEN 200 UNIT/ML SOPN INJECT 20 UNITS UNDER THE SKIN THREE TIMES DAILY BEFORE A MEAL Patient taking differently: Inject 20 Units into the skin 4 (four) times daily -  before meals and at bedtime.  03/19/18  Yes Reed, Tiffany L, DO  HYDROcodone-acetaminophen (NORCO) 10-325 MG tablet Take 1 tablet by mouth every 3 (three) hours as needed for moderate pain. 07/27/18  Yes Reed, Tiffany L, DO  levothyroxine (SYNTHROID, LEVOTHROID) 137 MCG tablet TAKE 1 TABLET BY MOUTH DAILY BEFORE BREAKFAST ON AN EMPTY STOMACH( LABS OVERDUE) Patient taking differently: Take 137 mcg by mouth daily before breakfast.  05/21/18  Yes Reed, Tiffany L, DO  lisinopril (PRINIVIL,ZESTRIL) 20 MG tablet Take 1 tablet (20 mg total) by mouth daily. Please schedule appointment for refills. 06/18/18  Yes Lelon Perla, MD  loperamide (IMODIUM A-D) 2 MG tablet Take 2 mg by mouth 4 (four) times daily as needed for  diarrhea or loose stools.   Yes [provider]  magnesium oxide (MAG-OX) 400 MG tablet Take 400 mg by mouth daily as needed (for leg cramps).    Yes [provider]  Melatonin 10 MG CAPS Take 10 mg by mouth at bedtime.    Yes [provider]  metoprolol tartrate (LOPRESSOR) 50 MG tablet Take 50 mg by mouth 2 (two) times daily.  04/26/17  Yes [provider]  MYRBETRIQ 50 MG TB24 tablet TAKE 1 TABLET(50 MG) BY MOUTH DAILY Patient taking differently: Take 50 mg by mouth daily.  05/31/18  Yes Reed, Tiffany L, DO  nystatin (NYSTATIN) powder Apply topically daily as needed. Patient taking differently: Apply 1 g topically daily as needed (for skin irritation/rash.).  12/15/17  Yes Reed, Tiffany L, DO  nystatin cream (MYCOSTATIN) Apply 1 application topically 2 (two) times daily. To skin folds Patient taking differently: Apply 1 application topically 2 (two) times daily as needed (for skin irritation/rash--skin folds.).  05/24/18  Yes Reed, Tiffany L, DO  omega-3 acid ethyl esters (LOVAZA) 1 g capsule TAKE ONE CAPSULE BY MOUTH EVERY DAY Patient taking differently: Take 1 g by mouth daily.  08/04/16  Yes Reed, Tiffany L, DO  ondansetron (ZOFRAN) 4 MG tablet Take 1 tablet (4 mg total) by mouth every 8 (eight) hours as needed for nausea or vomiting. 08/02/18  Yes Reed, Tiffany L, DO  oxyCODONE (OXY IR/ROXICODONE) 5 MG immediate release tablet Take 1 tablet (5 mg total) by mouth every 4 (four) hours as needed (pain). 07/10/18  Yes Judith Part, MD  polyvinyl alcohol (LIQUIFILM TEARS) 1.4 % ophthalmic solution Place 1 drop into both eyes 4 (four) times daily as needed (for dry/irritated eyes).    Yes [provider]  potassium chloride (MICRO-K) 10 MEQ CR capsule Take 20 mEq by mouth 2 (two) times daily as needed (when taking a dose of lasix.).  07/20/16  Yes [provider]  pregabalin (LYRICA) 150 MG capsule Take 1 capsule (150 mg total) by mouth 2  (two) times daily. 10/05/17  Yes Reed, Tiffany L, DO  tiZANidine (ZANAFLEX) 2 MG tablet Take 1 tablet (2 mg total) by  mouth 3 (three) times daily. 03/30/18  Yes Kathrynn Ducking, MD  Tofacitinib Citrate (XELJANZ) 5 MG TABS Take 1 tablet (5 mg total) by mouth 2 (two) times daily. 05/24/18  Yes [name] M, PA-C  TOUJEO SOLOSTAR 300 UNIT/ML SOPN INJECT 100 UNITS UNDER THE SKIN EVERY NIGHT AT BEDTIME Patient taking differently: Inject 100 Units into the skin at bedtime.  04/02/18  Yes Reed, Tiffany L, DO  triamterene-hydrochlorothiazide (MAXZIDE-25) 37.5-25 MG tablet TAKE 1 TABLET BY MOUTH DAILY 06/18/18  Yes Reed, Tiffany L, DO  umeclidinium-vilanterol (ANORO ELLIPTA) 62.5-25 MCG/INH AEPB Inhale 1 puff into the lungs daily. 06/01/17  Yes Reed, Tiffany L, DO  venlafaxine XR (EFFEXOR-XR) 75 MG 24 hr capsule Take one capsule by mouth once daily with breakfast 07/26/18  Yes Reed, Tiffany L, DO  VICTOZA 18 MG/3ML SOPN ADMINISTER 1.8 MG UNDER THE SKIN DAILY FOR BLOOD SUGAR Patient taking differently: Inject 1.8 mg into the skin every evening.  03/05/18  Yes Reed, Tiffany L, DO    ROS:  Out of a complete 14 system review of symptoms, the patient complains only of the following symptoms, and all other reviewed systems are negative.  Incontinence of the bladder, frequency of urination Leg swelling incontinence of the bowels Restless legs, insomnia Weakness   Blood pressure 132/68, pulse 64, height 5\' 6"  (1.676 m), weight 247 lb (112 kg).  Physical Exam  General: The patient is alert and cooperative at the time of the examination.  The patient is moderately to markedly obese.  Skin: 1+ edema is seen below the knees bilaterally, somewhat more prominent on the right than the left.   Neurologic Exam  Mental status: The patient is alert and oriented x 3 at the time of the examination. The patient has apparent normal recent and remote memory, with an apparently normal attention span and concentration  ability.   Cranial nerves: Facial symmetry is present. Speech is normal, no aphasia or dysarthria is noted. Extraocular movements are full. Visual fields are full.  Motor: The patient has good strength in all 4 extremities.  Sensory examination: Soft touch sensation is symmetric on the face, arms, and legs, with exception of some decreased soft touch sensation in the lateral aspect of the right leg below the knee.  Coordination: The patient has good finger-nose-finger and heel-to-shin bilaterally.  Gait and station: The patient is able to take a few steps with the examiner, the patient has a limping type gait that is severe involving the right leg, she is avoiding putting pressure on the leg.  Reflexes: Deep tendon reflexes are symmetric, but are depressed.   Assessment/Plan:  1.  Peripheral neuropathy, diabetic  2.  Lumbosacral spinal stenosis, recent surgery  3.  Rheumatoid arthritis  4.  Degenerative arthritis, right knee  The patient needs to have an evaluation through orthopedic surgery for her right knee arthritis, this is now the limiting factor in her ability to walk.  The patient is getting home health physical and occupational therapy, she is having some problems with healing of her surgical wound.  The patient will follow-up through this office on an as-needed basis.  Jill Alexanders MD 08/06/2018 9:56 AM  [facility] [address] [place], [place] [address]  Phone [phone] Fax [phone]

## 2018-08-08 ENCOUNTER — Other Ambulatory Visit: Payer: Self-pay

## 2018-08-08 DIAGNOSIS — T8140XD Infection following a procedure, unspecified, subsequent encounter: Secondary | ICD-10-CM | POA: Diagnosis not present

## 2018-08-08 DIAGNOSIS — R2689 Other abnormalities of gait and mobility: Secondary | ICD-10-CM | POA: Diagnosis not present

## 2018-08-08 DIAGNOSIS — I10 Essential (primary) hypertension: Secondary | ICD-10-CM | POA: Diagnosis not present

## 2018-08-08 DIAGNOSIS — L89892 Pressure ulcer of other site, stage 2: Secondary | ICD-10-CM | POA: Diagnosis not present

## 2018-08-08 DIAGNOSIS — F331 Major depressive disorder, recurrent, moderate: Secondary | ICD-10-CM | POA: Diagnosis not present

## 2018-08-08 DIAGNOSIS — E1140 Type 2 diabetes mellitus with diabetic neuropathy, unspecified: Secondary | ICD-10-CM | POA: Diagnosis not present

## 2018-08-08 NOTE — Telephone Encounter (Signed)
08/01/2018  1   07/30/2018  Oxycodone Hcl 5 MG Tablet  42.00 7 [place]  [id number]   [facility] ([id number])  0  45.00 MME  Comm Ins  [place]  08/01/2018  1   07/30/2018  Pregabalin 150 MG Capsule  60.00 30 [place]  [id number]   [facility] ([id number])  0  2.01 LME  Comm Ins  [place]  07/27/2018  1   07/27/2018  Hydrocodone-Acetamin 10-325 MG  240.00 30 [place]  [id number]   [facility] ([id number])  0  80.00 MME  Comm Ins     Pt just received a week supply from [facility] and Dr. Zada Finders said he was tapering her off of the oxycodone after her surgery.  I will no longer prescribe the oxycodone at this time.

## 2018-08-08 NOTE — Telephone Encounter (Signed)
Patient aware of response. Patient states she will see Dr.Reed on Thursday to further discuss

## 2018-08-09 ENCOUNTER — Ambulatory Visit (INDEPENDENT_AMBULATORY_CARE_PROVIDER_SITE_OTHER): Payer: Medicare Other | Admitting: Internal Medicine

## 2018-08-09 ENCOUNTER — Other Ambulatory Visit: Payer: Self-pay | Admitting: Internal Medicine

## 2018-08-09 ENCOUNTER — Encounter: Payer: Self-pay | Admitting: Internal Medicine

## 2018-08-09 VITALS — BP 130/60 | HR 64 | Temp 98.1°F | Ht 66.0 in | Wt 245.0 lb

## 2018-08-09 DIAGNOSIS — E1142 Type 2 diabetes mellitus with diabetic polyneuropathy: Secondary | ICD-10-CM | POA: Diagnosis not present

## 2018-08-09 DIAGNOSIS — M797 Fibromyalgia: Secondary | ICD-10-CM

## 2018-08-09 DIAGNOSIS — T8141XA Infection following a procedure, superficial incisional surgical site, initial encounter: Secondary | ICD-10-CM

## 2018-08-09 DIAGNOSIS — G8929 Other chronic pain: Secondary | ICD-10-CM

## 2018-08-09 DIAGNOSIS — M5441 Lumbago with sciatica, right side: Secondary | ICD-10-CM

## 2018-08-09 DIAGNOSIS — M1711 Unilateral primary osteoarthritis, right knee: Secondary | ICD-10-CM

## 2018-08-09 DIAGNOSIS — E876 Hypokalemia: Secondary | ICD-10-CM

## 2018-08-09 DIAGNOSIS — Z9889 Other specified postprocedural states: Secondary | ICD-10-CM | POA: Diagnosis not present

## 2018-08-09 DIAGNOSIS — Z794 Long term (current) use of insulin: Secondary | ICD-10-CM

## 2018-08-09 DIAGNOSIS — E039 Hypothyroidism, unspecified: Secondary | ICD-10-CM

## 2018-08-09 DIAGNOSIS — D72829 Elevated white blood cell count, unspecified: Secondary | ICD-10-CM | POA: Diagnosis not present

## 2018-08-09 MED ORDER — OXYCODONE HCL 5 MG PO TABS: 5.0000 mg | ORAL_TABLET | Freq: Four times a day (QID) | ORAL | 0 refills | Status: DC | PRN

## 2018-08-09 MED ORDER — POTASSIUM CHLORIDE ER 10 MEQ PO CPCR: 20.0000 meq | ORAL_CAPSULE | Freq: Two times a day (BID) | ORAL | 0 refills | Status: DC | PRN

## 2018-08-09 NOTE — Progress Notes (Signed)
Location:  [facility] clinic Provider:  Ayomide Zuleta L. Mariea Delgado, D.O., C.M.D.  Goals of Care:  Advanced Directives 07/03/2018  Does Patient Have a Medical Advance Directive? Yes  Type of Advance Directive Living will  Does patient want to make changes to medical advance directive? No - Patient declined  Copy of [place] in Chart? -  Would patient like information on creating a medical advance directive? -  Pre-existing out of facility DNR order (yellow form or pink MOST form) -     Chief Complaint  Patient presents with  . Medical Management of Chronic Issues    4MTH FOLLOW-UP    HPI: Patient is a 77 y.o. female seen today for medical management of chronic diseases.    Pt had back surgery (L3-5 lumbar laminectomies for lumbar stenosis with neurogenic claudication) and her back surgeon indicating he was tapering her off oxycodone that she was taking for this pain.  She is also on quite a bit of hydrocodone.  She reports she also uses that oxycodone for fibromyalgia (not an indication), knee OA.  Notes indicated suggestion for her to get knees evaluated by ortho next.  Back pain should be improved postop so she won't need as much medication.  Katherine Delgado has been reviewed.  She stayed at [facility] after her surgery.  Has had infection of her incision site.  Says she is not on an antibiotic now.  She was on one at first--kefex, per her son.  She completed the course and there's been improvement.  I examined it today and son reported that wound had been left open at the skin level intentionally to allow for better healing, but OR note does not indicate this and it appears dehisced to me.  She has yellow thick granulation tissue through the middle of the wound and the inside sutures are visible.  The pain at the site is improved and there is minor redness around the edges, but the tissue around the wound appears to be curling under rather than approximating across the wound.  [facility] is checking the wound weekly and her son is doing daily dressing changes.    Oddly both gabapentin and lyrica are on her med list when lyrica was to replace gabapentin.    Reports her arthritis is flared up.    Nausea was for the first few days when she returned home from the hospital and resolved after using zofran for just a few days.    She has not had an eye exam this year due to cost.  Left great toe is irritated now after dragging her feet.  Says right knee is killing her.  Wants to see Dr[facility] Keturah Barre [facility] for a possible injection.  It's limiting her walking.  Left leg is fine.  Back pain is greatly improved.  Does feel like it was well worthwhile and there was no other option but the surgery at that point.  Getting home health PT, OT, RN thru [facility].    Past Medical History:  Diagnosis Date  . Abnormal weight gain   . Abnormality of gait 04/19/2016  . Acute infective polyneuritis ([place]) 2002  . Acute maxillary sinusitis   . Acute sinusitis, unspecified   . Allergic rhinitis due to pollen   . Arthritis   . Back injury   . Candidiasis of skin and nails   . Candidiasis of vulva and vagina   . Chronic pain syndrome   . COPD (chronic  obstructive pulmonary disease) ([place])   . Cough   . Degenerative arthritis   . Depressive disorder, not elsewhere classified   . Diabetes mellitus without complication ([place])   . Diaphragmatic hernia without mention of obstruction or gangrene   . Dyslipidemia   . Edema   . Fibromyalgia   . GERD (gastroesophageal reflux disease)   . Guillain-Barre syndrome ([place])   . History of benign thymus tumor   . Hypertension   . Insomnia, unspecified   . Lumbago   . Lumbar spinal stenosis 03/30/2018   L4-5 level, severe  . Miscarriage 1962  . Mixed hyperlipidemia   . Morbid obesity ([place])   . Obstructive chronic bronchitis with acute bronchitis ([place])   . Osteopenia, senile   . Osteoporosis   . Other malaise and fatigue   . Other  specified disease of white blood cells   . Pain in joint, multiple sites   . Pneumonia   . Polyneuropathy in diabetes(357.2)   . RA (rheumatoid arthritis) ([place])   . Reflux esophagitis   . Rheumatoid arthritis(714.0)   . Shortness of breath   . Spinal stenosis, lumbar region, without neurogenic claudication   . Spondylosis, lumbosacral   . Spontaneous ecchymoses   . Stiffness of joints, not elsewhere classified, multiple sites   . Tear film insufficiency, unspecified   . Thyroid disease   . Thyroid disorder   . Type II or unspecified type diabetes mellitus with peripheral circulatory disorders, uncontrolled(250.72)   . Unspecified chronic bronchitis ([place])   . Unspecified essential hypertension   . Unspecified hypothyroidism   . Unspecified pruritic disorder   . Unspecified urinary incontinence   . Urinary tract infection, site not specified     Past Surgical History:  Procedure Laterality Date  . ABDOMINAL HYSTERECTOMY  1974  . abdominal tumor  2002  . APPENDECTOMY    . [place]  . BRONCHOSCOPY  2001  . CHOLECYSTECTOMY  1984  . KNEE SURGERY Bilateral 08/27/2010 (L) and 01/11/2011 (R)  . LUMBAR LAMINECTOMY/DECOMPRESSION MICRODISCECTOMY N/A 07/03/2018   Procedure: Lumbar Three to Lumbar Five Laminectomy;  Surgeon: Katherine Part, MD;  Location: [facility];  Service: Neurosurgery;  Laterality: N/A;  . miscarrage  1962  . OVARIAN CYST SURGERY  1968  . thymus tumor    . 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 TONSILLECTOMY      Allergies  Allergen Reactions  . Influenza Vaccines Other (See Comments)    "h/o Guillain Barre; dr's told me years ago never to take another flu shot as it could relapse Katherine Delgado; has to do with when vaccine being changed to H1N1 virus"  . Penicillins Hives, Itching, Swelling and Rash    Has patient had a PCN reaction causing immediate rash, facial/tongue/throat swelling, SOB or lightheadedness with hypotension:Unknown Has patient had a PCN reaction  causing severe rash involving mucus membranes or skin necrosis: Unknown Has patient had a PCN reaction that required hospitalization:Unknown Has patient had a PCN reaction occurring within the last 10 years: Unknown If all of the above answers are "NO", then may proceed with Cephalosporin use.   . Statins Other (See Comments)    Myopathy, transaminitis  . Sulfamethoxazole-Trimethoprim Itching    Outpatient Encounter Medications as of 08/09/2018  Medication Sig  . albuterol (PROVENTIL HFA;VENTOLIN HFA) 108 (90 Base) MCG/ACT inhaler Inhale 2 puffs into the lungs every 6 (six) hours as needed for wheezing or shortness of breath.  Marland Kitchen amLODipine (NORVASC) 10 MG tablet Take  1 tablet (10 mg total) by mouth daily. Please schedule appointment for refills.  . B-D UF III MINI PEN NEEDLES 31G X 5 MM MISC USE AS DIRECTED  . cadexomer iodine (IODOSORB) 0.9 % gel Apply 1 application topically daily as needed for wound care.  . docusate sodium (COLACE) 100 MG capsule Take 1 capsule (100 mg total) by mouth 2 (two) times daily.  . folic acid (FOLVITE) 1 MG tablet Take 2 tablets (2 mg total) by mouth daily.  . furosemide (LASIX) 20 MG tablet TAKE 1 TABLET BY MOUTH TWICE DAILY AS NEEDED FOR 3 POUND WEIGHT GAIN IN ONE DAY OR 5 POUNDS IN ONE WEEK  . gabapentin (NEURONTIN) 600 MG tablet TAKE 1 TABLET BY MOUTH THREE TIMES DAILY  . glucose blood (ACCU-CHEK SMARTVIEW) test strip Check blood sugar 3-4 times daily  . HUMALOG KWIKPEN 200 UNIT/ML SOPN INJECT 20 UNITS UNDER THE SKIN THREE TIMES DAILY BEFORE A MEAL  . HYDROcodone-acetaminophen (NORCO) 10-325 MG tablet Take 1 tablet by mouth every 3 (three) hours as needed for moderate pain.  Marland Kitchen levothyroxine (SYNTHROID, LEVOTHROID) 137 MCG tablet TAKE 1 TABLET BY MOUTH DAILY BEFORE BREAKFAST ON AN EMPTY STOMACH( LABS OVERDUE)  . lisinopril (PRINIVIL,ZESTRIL) 20 MG tablet Take 1 tablet (20 mg total) by mouth daily. Please schedule appointment for refills.  Marland Kitchen loperamide  (IMODIUM A-D) 2 MG tablet Take 2 mg by mouth 4 (four) times daily as needed for diarrhea or loose stools.  . magnesium oxide (MAG-OX) 400 MG tablet Take 400 mg by mouth daily as needed (for leg cramps).   . Melatonin 10 MG CAPS Take 10 mg by mouth at bedtime.   . metoprolol tartrate (LOPRESSOR) 50 MG tablet Take 50 mg by mouth 2 (two) times daily.   Marland Kitchen MYRBETRIQ 50 MG TB24 tablet TAKE 1 TABLET(50 MG) BY MOUTH DAILY  . nystatin (NYSTATIN) powder Apply topically daily as needed.  . nystatin cream (MYCOSTATIN) Apply 1 application topically 2 (two) times daily. To skin folds  . omega-3 acid ethyl esters (LOVAZA) 1 g capsule TAKE ONE CAPSULE BY MOUTH EVERY DAY  . ondansetron (ZOFRAN) 4 MG tablet Take 1 tablet (4 mg total) by mouth every 8 (eight) hours as needed for nausea or vomiting.  Marland Kitchen oxyCODONE (OXY IR/ROXICODONE) 5 MG immediate release tablet Take 1 tablet (5 mg total) by mouth every 4 (four) hours as needed (pain).  . polyvinyl alcohol (LIQUIFILM TEARS) 1.4 % ophthalmic solution Place 1 drop into both eyes 4 (four) times daily as needed (for dry/irritated eyes).   . potassium chloride (MICRO-K) 10 MEQ CR capsule Take 20 mEq by mouth 2 (two) times daily as needed (when taking a dose of lasix.).   Marland Kitchen pregabalin (LYRICA) 150 MG capsule Take 1 capsule (150 mg total) by mouth 2 (two) times daily.  Marland Kitchen tiZANidine (ZANAFLEX) 2 MG tablet Take 1 tablet (2 mg total) by mouth 3 (three) times daily.  . Tofacitinib Citrate (XELJANZ) 5 MG TABS Take 1 tablet (5 mg total) by mouth 2 (two) times daily.  . TOUJEO SOLOSTAR 300 UNIT/ML SOPN INJECT 100 UNITS UNDER THE SKIN EVERY NIGHT AT BEDTIME  . triamterene-hydrochlorothiazide (MAXZIDE-25) 37.5-25 MG tablet TAKE 1 TABLET BY MOUTH DAILY  . umeclidinium-vilanterol (ANORO ELLIPTA) 62.5-25 MCG/INH AEPB Inhale 1 puff into the lungs daily.  Marland Kitchen venlafaxine XR (EFFEXOR-XR) 75 MG 24 hr capsule Take one capsule by mouth once daily with breakfast  . VICTOZA 18 MG/3ML SOPN  ADMINISTER 1.8 MG UNDER THE SKIN DAILY FOR  BLOOD SUGAR   No facility-administered encounter medications on file as of 08/09/2018.     Review of Systems:  Review of Systems  Constitutional: Positive for malaise/fatigue. Negative for chills and fever.  Eyes: Negative for blurred vision.  Respiratory: Negative for cough and shortness of breath.   Cardiovascular: Negative for chest pain, palpitations and leg swelling.  Gastrointestinal: Positive for constipation. Negative for abdominal pain, blood in stool and melena.  Genitourinary: Negative for dysuria.  Musculoskeletal: Positive for back pain, joint pain and myalgias. Negative for falls.  Skin: Negative for itching and rash.       Unhealed lumbar surgical wound  Neurological: Positive for tingling and sensory change. Negative for dizziness and loss of consciousness.  Endo/Heme/Allergies: Bruises/bleeds easily.  Psychiatric/Behavioral: Negative for depression and memory loss. The patient is not nervous/anxious and does not have insomnia.     Health Maintenance  Topic Date Due  . OPHTHALMOLOGY EXAM  12/20/1950  . FOOT EXAM  09/27/2016  . INFLUENZA VACCINE  08/22/2019 (Originally 03/22/2018)  . HEMOGLOBIN A1C  01/01/2019  . DEXA SCAN  09/20/2025  . TETANUS/TDAP  05/28/2026  . PNA vac Low Risk Adult  Completed    Physical Exam: Vitals:   08/09/18 1116  BP: 130/60  Pulse: 64  Temp: 98.1 F (36.7 C)  TempSrc: Oral  SpO2: 91%  Weight: 245 lb (111.1 kg)  Height: 5\' 6"  (1.676 m)   Body mass index is 39.54 kg/m. Physical Exam Constitutional:      Appearance: Normal appearance. She is obese.  HENT:     Head: Normocephalic and atraumatic.  Cardiovascular:     Rate and Rhythm: Normal rate and regular rhythm.     Pulses: Normal pulses.     Heart sounds: Normal heart sounds.  Musculoskeletal: Normal range of motion.        General: Tenderness present.     Comments: Of right knee; here in wheelchair today  Skin:    Capillary  Refill: Capillary refill takes less than 2 seconds.     Comments: See photos in media of incision from today  Neurological:     Mental Status: She is alert and oriented to person, place, and time.     Comments: Struggling more with memory since surgery  Psychiatric:        Mood and Affect: Mood normal.     Labs reviewed: Basic Metabolic Panel: Recent Labs    01/22/18 0941 04/06/18 0930 07/03/18 1021  NA 139 141 137  K 4.6 4.8 3.9  CL 101 103 100  CO2 30 31 29   GLUCOSE 198* 102* 194*  BUN 14 18 23   CREATININE 0.97* 0.93 0.98  CALCIUM 9.7 9.1 9.4  TSH  --  0.73  --    Liver Function Tests: Recent Labs    12/27/17 1352 01/22/18 0941 04/06/18 0930  AST 42* 26 41*  ALT 23 14 18   BILITOT 0.3 0.5 0.4  PROT 6.9 7.0 6.3   No results for input(s): LIPASE, AMYLASE in the last 8760 hours. No results for input(s): AMMONIA in the last 8760 hours. CBC: Recent Labs    01/22/18 0941 03/06/18 0941 04/06/18 0930 07/03/18 1021  WBC 13.3* 16.6* 14.5* 14.7*  NEUTROABS 9,323* 12,915* 10,701*  --   HGB 14.5 13.7 13.2 13.2  HCT 43.9 42.1 39.7 44.2  MCV 84.1 85.1 83.8 90.0  PLT 305 307 346 348   Lipid Panel: Recent Labs    04/06/18 0930  CHOL 129  HDL 29*  LDLCALC 75  TRIG 145  CHOLHDL 4.4   Lab Results  Component Value Date   HGBA1C 7.3 (H) 07/03/2018    Assessment/Plan 1. Hypokalemia due to excessive renal loss of potassium - for potassium only if uses her lasix which she has not needed recently - potassium chloride (MICRO-K) 10 MEQ CR capsule; Take 2 capsules (20 mEq total) by mouth 2 (two) times daily as needed (when taking a dose of lasix.).  Dispense: 60 capsule; Refill: 0 - COMPLETE METABOLIC PANEL WITH GFR  2. Chronic right-sided low back pain with right-sided sciatica -much improved postop -will gradually taper her oxycodone--7 day supply of q6h 5mg  tabs dispensed today; then will go to q8h for one week; then bid for one week; then daily for one week  3.  Fibromyalgia -cont lyrica and muscle relaxer -was not to be on both gabapentin and lyrica  4. Primary osteoarthritis of right knee -says she will see her rheumatologist about this  5. Type 2 diabetes mellitus with diabetic polyneuropathy, with long-term current use of insulin (HCC) - need to see where this is at this point - Hemoglobin A1c - Lipid panel  6. Acquired hypothyroidism - cont levothyroxine  - TSH  7. Superficial incisional infection of surgical site - appears this resolved, but wound appears dehisced to me unless her son is correct that wound was "left open" which I've never seen for a back surgery - getting home health nursing, but appears would benefit from wound center for mechanical debridement--referral entered - CBC with Differential/Platelet  8. Leukocytosis, unspecified type - wbc chronically elevated, but need to be sure it's not even worse with her recent back wound infection; also high risk patient for osteomyelitis or epidural abscess given her immunocompromise, underlying diabetes, etc. - CBC with Differential/Platelet  Labs/tests ordered:   Orders Placed This Encounter  Procedures  . CBC with Differential/Platelet  . COMPLETE METABOLIC PANEL WITH GFR  . Hemoglobin A1c  . Lipid panel  . TSH    Next appt:  10/08/2018   Ashtyn Freilich L. Taleyah Hillman, D.O. [facility] [address], [place] [address] Cell Phone (Mon-Fri 8am-5pm):  [phone] On Call:  [phone] & follow prompts after 5pm & weekends Office Phone:  [phone] Office Fax:  [phone]

## 2018-08-10 DIAGNOSIS — F331 Major depressive disorder, recurrent, moderate: Secondary | ICD-10-CM | POA: Diagnosis not present

## 2018-08-10 DIAGNOSIS — T8140XD Infection following a procedure, unspecified, subsequent encounter: Secondary | ICD-10-CM | POA: Diagnosis not present

## 2018-08-10 DIAGNOSIS — R2689 Other abnormalities of gait and mobility: Secondary | ICD-10-CM | POA: Diagnosis not present

## 2018-08-10 DIAGNOSIS — I10 Essential (primary) hypertension: Secondary | ICD-10-CM | POA: Diagnosis not present

## 2018-08-10 DIAGNOSIS — L89892 Pressure ulcer of other site, stage 2: Secondary | ICD-10-CM | POA: Diagnosis not present

## 2018-08-10 DIAGNOSIS — E1140 Type 2 diabetes mellitus with diabetic neuropathy, unspecified: Secondary | ICD-10-CM | POA: Diagnosis not present

## 2018-08-10 LAB — COMPLETE METABOLIC PANEL WITH GFR
AG Ratio: 1.4 (calc) (ref 1.0–2.5)
ALT: 9 U/L (ref 6–29)
AST: 17 U/L (ref 10–35)
Albumin: 3.8 g/dL (ref 3.6–5.1)
Alkaline phosphatase (APISO): 120 U/L (ref 33–130)
BUN/Creatinine Ratio: 19 (calc) (ref 6–22)
BUN: 19 mg/dL (ref 7–25)
CO2: 30 mmol/L (ref 20–32)
Calcium: 9.8 mg/dL (ref 8.6–10.4)
Chloride: 104 mmol/L (ref 98–110)
Creat: 1 mg/dL — ABNORMAL HIGH (ref 0.60–0.93)
GFR, Est African American: 63 mL/min/{1.73_m2} (ref >=60)
GFR, Est Non African American: 54 mL/min/{1.73_m2} — ABNORMAL LOW (ref >=60)
Globulin: 2.7 g/dL (calc) (ref 1.9–3.7)
Glucose, Bld: 45 mg/dL — ABNORMAL LOW (ref 65–139)
Potassium: 4.1 mmol/L (ref 3.5–5.3)
Sodium: 142 mmol/L (ref 135–146)
Total Bilirubin: 0.4 mg/dL (ref 0.2–1.2)
Total Protein: 6.5 g/dL (ref 6.1–8.1)

## 2018-08-10 LAB — CBC WITH DIFFERENTIAL/PLATELET
Absolute Monocytes: 1359 cells/uL — ABNORMAL HIGH (ref 200–950)
Basophils Absolute: 120 cells/uL (ref 0–200)
Basophils Relative: 0.7 %
Eosinophils Absolute: 894 cells/uL — ABNORMAL HIGH (ref 15–500)
Eosinophils Relative: 5.2 %
HCT: 42.9 % (ref 35.0–45.0)
Hemoglobin: 13.9 g/dL (ref 11.7–15.5)
Lymphs Abs: 2563 cells/uL (ref 850–3900)
MCH: 27.5 pg (ref 27.0–33.0)
MCHC: 32.4 g/dL (ref 32.0–36.0)
MCV: 84.8 fL (ref 80.0–100.0)
MPV: 11 fL (ref 7.5–12.5)
Monocytes Relative: 7.9 %
Neutro Abs: 12264 cells/uL — ABNORMAL HIGH (ref 1500–7800)
Neutrophils Relative %: 71.3 %
Platelets: 430 10*3/uL — ABNORMAL HIGH (ref 140–400)
RBC: 5.06 10*6/uL (ref 3.80–5.10)
RDW: 13.5 % (ref 11.0–15.0)
Total Lymphocyte: 14.9 %
WBC: 17.2 10*3/uL — ABNORMAL HIGH (ref 3.8–10.8)

## 2018-08-10 LAB — LIPID PANEL
Cholesterol: 159 mg/dL (ref <200)
HDL: 30 mg/dL — ABNORMAL LOW (ref >50)
LDL Cholesterol (Calc): 97 mg/dL (calc)
Non-HDL Cholesterol (Calc): 129 mg/dL (calc) (ref <130)
Total CHOL/HDL Ratio: 5.3 (calc) — ABNORMAL HIGH (ref <5.0)
Triglycerides: 219 mg/dL — ABNORMAL HIGH (ref <150)

## 2018-08-10 LAB — TSH: TSH: 0.84 mIU/L (ref 0.40–4.50)

## 2018-08-10 LAB — HEMOGLOBIN A1C
Hgb A1c MFr Bld: 7.4 % of total Hgb — ABNORMAL HIGH (ref <5.7)
Mean Plasma Glucose: 166 (calc)
eAG (mmol/L): 9.2 (calc)

## 2018-08-13 ENCOUNTER — Other Ambulatory Visit: Payer: Self-pay | Admitting: *Deleted

## 2018-08-13 DIAGNOSIS — F331 Major depressive disorder, recurrent, moderate: Secondary | ICD-10-CM | POA: Diagnosis not present

## 2018-08-13 DIAGNOSIS — E1140 Type 2 diabetes mellitus with diabetic neuropathy, unspecified: Secondary | ICD-10-CM | POA: Diagnosis not present

## 2018-08-13 DIAGNOSIS — T8140XD Infection following a procedure, unspecified, subsequent encounter: Secondary | ICD-10-CM | POA: Diagnosis not present

## 2018-08-13 DIAGNOSIS — L89892 Pressure ulcer of other site, stage 2: Secondary | ICD-10-CM | POA: Diagnosis not present

## 2018-08-13 DIAGNOSIS — R2689 Other abnormalities of gait and mobility: Secondary | ICD-10-CM | POA: Diagnosis not present

## 2018-08-13 DIAGNOSIS — I10 Essential (primary) hypertension: Secondary | ICD-10-CM | POA: Diagnosis not present

## 2018-08-13 MED ORDER — OXYCODONE HCL 5 MG PO TABS: 5.0000 mg | ORAL_TABLET | Freq: Three times a day (TID) | ORAL | 0 refills | Status: DC | PRN

## 2018-08-13 NOTE — Telephone Encounter (Signed)
Patient called and stated that she will be out of Oxycodone 5mg  on Christmas Day and she just gave her a Rx on 12/19. I explained to patient that Dr. Mariea Clonts was tapering the medication. She seemed confused at having to tapering the medication but agreed.   [place] Verified  LR: 08/09/2018 Pended Rx and sent to Dr. Mariea Clonts for approval with updated Sig.     Per Dr. Cyndi Lennert OV note 08/09/18: . Chronic right-sided low back pain with right-sided sciatica -much improved postop -will gradually taper her oxycodone--7 day supply of q6h 5mg  tabs dispensed today; then will go to q8h for one week; then bid for one week; then daily for one week

## 2018-08-16 DIAGNOSIS — F331 Major depressive disorder, recurrent, moderate: Secondary | ICD-10-CM | POA: Diagnosis not present

## 2018-08-16 DIAGNOSIS — L89892 Pressure ulcer of other site, stage 2: Secondary | ICD-10-CM | POA: Diagnosis not present

## 2018-08-16 DIAGNOSIS — R2689 Other abnormalities of gait and mobility: Secondary | ICD-10-CM | POA: Diagnosis not present

## 2018-08-16 DIAGNOSIS — I10 Essential (primary) hypertension: Secondary | ICD-10-CM | POA: Diagnosis not present

## 2018-08-16 DIAGNOSIS — E1140 Type 2 diabetes mellitus with diabetic neuropathy, unspecified: Secondary | ICD-10-CM | POA: Diagnosis not present

## 2018-08-16 DIAGNOSIS — T8140XD Infection following a procedure, unspecified, subsequent encounter: Secondary | ICD-10-CM | POA: Diagnosis not present

## 2018-08-17 ENCOUNTER — Inpatient Hospital Stay (HOSPITAL_COMMUNITY)
Admission: AD | Admit: 2018-08-17 | Discharge: 2018-08-24 | DRG: 902 | Disposition: A | Discharge status: Home Health | Payer: Medicare Other | Source: Ambulatory Visit | Attending: Neurological Surgery | Admitting: Neurological Surgery

## 2018-08-17 ENCOUNTER — Other Ambulatory Visit: Payer: Self-pay

## 2018-08-17 ENCOUNTER — Encounter (HOSPITAL_COMMUNITY): Payer: Self-pay | Admitting: *Deleted

## 2018-08-17 DIAGNOSIS — G8929 Other chronic pain: Secondary | ICD-10-CM | POA: Diagnosis present

## 2018-08-17 DIAGNOSIS — M545 Low back pain: Secondary | ICD-10-CM | POA: Diagnosis present

## 2018-08-17 DIAGNOSIS — M5441 Lumbago with sciatica, right side: Secondary | ICD-10-CM | POA: Diagnosis present

## 2018-08-17 DIAGNOSIS — Z880 Allergy status to penicillin: Secondary | ICD-10-CM | POA: Diagnosis not present

## 2018-08-17 DIAGNOSIS — Z87891 Personal history of nicotine dependence: Secondary | ICD-10-CM | POA: Diagnosis not present

## 2018-08-17 DIAGNOSIS — E1151 Type 2 diabetes mellitus with diabetic peripheral angiopathy without gangrene: Secondary | ICD-10-CM | POA: Diagnosis not present

## 2018-08-17 DIAGNOSIS — Z8371 Family history of colonic polyps: Secondary | ICD-10-CM

## 2018-08-17 DIAGNOSIS — E1165 Type 2 diabetes mellitus with hyperglycemia: Secondary | ICD-10-CM | POA: Diagnosis present

## 2018-08-17 DIAGNOSIS — T8130XA Disruption of wound, unspecified, initial encounter: Secondary | ICD-10-CM | POA: Diagnosis present

## 2018-08-17 DIAGNOSIS — M797 Fibromyalgia: Secondary | ICD-10-CM | POA: Diagnosis present

## 2018-08-17 DIAGNOSIS — T8132XA Disruption of internal operation (surgical) wound, not elsewhere classified, initial encounter: Secondary | ICD-10-CM | POA: Diagnosis not present

## 2018-08-17 DIAGNOSIS — E782 Mixed hyperlipidemia: Secondary | ICD-10-CM | POA: Diagnosis present

## 2018-08-17 DIAGNOSIS — Z79899 Other long term (current) drug therapy: Secondary | ICD-10-CM

## 2018-08-17 DIAGNOSIS — Z820 Family history of epilepsy and other diseases of the nervous system: Secondary | ICD-10-CM

## 2018-08-17 DIAGNOSIS — I10 Essential (primary) hypertension: Secondary | ICD-10-CM | POA: Diagnosis present

## 2018-08-17 DIAGNOSIS — F329 Major depressive disorder, single episode, unspecified: Secondary | ICD-10-CM | POA: Diagnosis present

## 2018-08-17 DIAGNOSIS — E1142 Type 2 diabetes mellitus with diabetic polyneuropathy: Secondary | ICD-10-CM | POA: Diagnosis present

## 2018-08-17 DIAGNOSIS — Z887 Allergy status to serum and vaccine status: Secondary | ICD-10-CM | POA: Diagnosis not present

## 2018-08-17 DIAGNOSIS — Z90710 Acquired absence of both cervix and uterus: Secondary | ICD-10-CM | POA: Diagnosis not present

## 2018-08-17 DIAGNOSIS — E039 Hypothyroidism, unspecified: Secondary | ICD-10-CM | POA: Diagnosis present

## 2018-08-17 DIAGNOSIS — Z9049 Acquired absence of other specified parts of digestive tract: Secondary | ICD-10-CM

## 2018-08-17 DIAGNOSIS — Z79891 Long term (current) use of opiate analgesic: Secondary | ICD-10-CM

## 2018-08-17 DIAGNOSIS — Z6841 Body Mass Index (BMI) 40.0 and over, adult: Secondary | ICD-10-CM

## 2018-08-17 DIAGNOSIS — G610 Guillain-Barre syndrome: Secondary | ICD-10-CM | POA: Diagnosis present

## 2018-08-17 DIAGNOSIS — M069 Rheumatoid arthritis, unspecified: Secondary | ICD-10-CM | POA: Diagnosis present

## 2018-08-17 DIAGNOSIS — Z794 Long term (current) use of insulin: Secondary | ICD-10-CM

## 2018-08-17 DIAGNOSIS — Z888 Allergy status to other drugs, medicaments and biological substances status: Secondary | ICD-10-CM | POA: Diagnosis not present

## 2018-08-17 DIAGNOSIS — J449 Chronic obstructive pulmonary disease, unspecified: Secondary | ICD-10-CM | POA: Diagnosis present

## 2018-08-17 DIAGNOSIS — G894 Chronic pain syndrome: Secondary | ICD-10-CM | POA: Diagnosis present

## 2018-08-17 DIAGNOSIS — Z882 Allergy status to sulfonamides status: Secondary | ICD-10-CM | POA: Diagnosis not present

## 2018-08-17 DIAGNOSIS — Z8249 Family history of ischemic heart disease and other diseases of the circulatory system: Secondary | ICD-10-CM

## 2018-08-17 DIAGNOSIS — T8131XA Disruption of external operation (surgical) wound, not elsewhere classified, initial encounter: Principal | ICD-10-CM | POA: Diagnosis present

## 2018-08-17 DIAGNOSIS — Z79890 Hormone replacement therapy (postmenopausal): Secondary | ICD-10-CM

## 2018-08-17 LAB — GLUCOSE, CAPILLARY
Glucose-Capillary: 170 mg/dL — ABNORMAL HIGH (ref 70–99)
Glucose-Capillary: 169 mg/dL — ABNORMAL HIGH (ref 70–99)

## 2018-08-17 LAB — SEDIMENTATION RATE: Sed Rate: 24 mm/hr — ABNORMAL HIGH (ref 0–22)

## 2018-08-17 LAB — CBC
HCT: 41.8 % (ref 36.0–46.0)
Hemoglobin: 12.9 g/dL (ref 12.0–15.0)
MCH: 26.9 pg (ref 26.0–34.0)
MCHC: 30.9 g/dL (ref 30.0–36.0)
MCV: 87.1 fL (ref 80.0–100.0)
Platelets: 292 10*3/uL (ref 150–400)
RBC: 4.8 MIL/uL (ref 3.87–5.11)
RDW: 13.3 % (ref 11.5–15.5)
WBC: 13.3 10*3/uL — ABNORMAL HIGH (ref 4.0–10.5)
nRBC: 0 % (ref 0.0–0.2)

## 2018-08-17 LAB — SURGICAL PCR SCREEN
MRSA, PCR: NEGATIVE
Staphylococcus aureus: NEGATIVE

## 2018-08-17 LAB — C-REACTIVE PROTEIN: CRP: 2.9 mg/dL — AB (ref <1.0)

## 2018-08-17 MED ORDER — TRIAMTERENE-HCTZ 37.5-25 MG PO TABS
1.0000 | ORAL_TABLET | Freq: Every day | ORAL | Status: DC
  Administered 2018-08-18 – 2018-08-24 (×6): 1 via ORAL
  Filled 2018-08-17 (×7): qty 1

## 2018-08-17 MED ORDER — ONDANSETRON HCL 4 MG/2ML IJ SOLN: 4.0000 mg | Freq: Four times a day (QID) | INTRAMUSCULAR | Status: DC | PRN

## 2018-08-17 MED ORDER — HYDROCODONE-ACETAMINOPHEN 5-325 MG PO TABS
1.0000 | ORAL_TABLET | ORAL | Status: DC | PRN
  Administered 2018-08-17 – 2018-08-19 (×7): 2 via ORAL
  Administered 2018-08-20: 1 via ORAL
  Administered 2018-08-20: 2 via ORAL
  Administered 2018-08-21 (×3): 1 via ORAL
  Administered 2018-08-22 – 2018-08-24 (×7): 2 via ORAL
  Filled 2018-08-17 (×6): qty 2
  Filled 2018-08-17: qty 1
  Filled 2018-08-17: qty 2
  Filled 2018-08-17: qty 1
  Filled 2018-08-17 (×10): qty 2
  Filled 2018-08-17: qty 1

## 2018-08-17 MED ORDER — ACETAMINOPHEN 325 MG PO TABS
650.0000 mg | ORAL_TABLET | Freq: Four times a day (QID) | ORAL | Status: DC | PRN
  Administered 2018-08-21: 650 mg via ORAL
  Filled 2018-08-17: qty 2

## 2018-08-17 MED ORDER — AMLODIPINE BESYLATE 10 MG PO TABS
10.0000 mg | ORAL_TABLET | Freq: Every day | ORAL | Status: DC
  Administered 2018-08-17 – 2018-08-24 (×7): 10 mg via ORAL
  Filled 2018-08-17 (×8): qty 1

## 2018-08-17 MED ORDER — INSULIN ASPART 100 UNIT/ML ~~LOC~~ SOLN
0.0000 [IU] | Freq: Three times a day (TID) | SUBCUTANEOUS | Status: DC
  Administered 2018-08-18: 7 [IU] via SUBCUTANEOUS
  Administered 2018-08-18: 4 [IU] via SUBCUTANEOUS
  Administered 2018-08-18: 3 [IU] via SUBCUTANEOUS
  Administered 2018-08-19: 4 [IU] via SUBCUTANEOUS
  Administered 2018-08-19: 3 [IU] via SUBCUTANEOUS
  Administered 2018-08-19: 15 [IU] via SUBCUTANEOUS
  Administered 2018-08-20: 11 [IU] via SUBCUTANEOUS
  Administered 2018-08-20: 4 [IU] via SUBCUTANEOUS
  Administered 2018-08-20: 11 [IU] via SUBCUTANEOUS
  Administered 2018-08-21: 20 [IU] via SUBCUTANEOUS
  Administered 2018-08-21: 15 [IU] via SUBCUTANEOUS
  Administered 2018-08-21: 7 [IU] via SUBCUTANEOUS
  Administered 2018-08-22 (×2): 11 [IU] via SUBCUTANEOUS
  Administered 2018-08-22: 20 [IU] via SUBCUTANEOUS
  Administered 2018-08-22: 11 [IU] via SUBCUTANEOUS
  Administered 2018-08-23: 3 [IU] via SUBCUTANEOUS
  Administered 2018-08-23: 15 [IU] via SUBCUTANEOUS
  Administered 2018-08-23: 7 [IU] via SUBCUTANEOUS
  Administered 2018-08-24: 11 [IU] via SUBCUTANEOUS
  Administered 2018-08-24: 3 [IU] via SUBCUTANEOUS

## 2018-08-17 MED ORDER — MUPIROCIN 2 % EX OINT
1.0000 "application " | TOPICAL_OINTMENT | Freq: Two times a day (BID) | CUTANEOUS | Status: AC
  Administered 2018-08-18 – 2018-08-22 (×9): 1 via NASAL
  Filled 2018-08-17: qty 22

## 2018-08-17 MED ORDER — VENLAFAXINE HCL ER 75 MG PO CP24
75.0000 mg | ORAL_CAPSULE | Freq: Every day | ORAL | Status: DC
  Administered 2018-08-18 – 2018-08-24 (×6): 75 mg via ORAL
  Filled 2018-08-17 (×6): qty 1

## 2018-08-17 MED ORDER — LISINOPRIL 20 MG PO TABS
20.0000 mg | ORAL_TABLET | Freq: Every day | ORAL | Status: DC
  Administered 2018-08-17 – 2018-08-24 (×7): 20 mg via ORAL
  Filled 2018-08-17 (×7): qty 1

## 2018-08-17 MED ORDER — LEVOTHYROXINE SODIUM 25 MCG PO TABS
137.0000 ug | ORAL_TABLET | Freq: Every day | ORAL | Status: DC
  Administered 2018-08-19 – 2018-08-24 (×6): 137 ug via ORAL
  Filled 2018-08-17 (×7): qty 1

## 2018-08-17 MED ORDER — METOPROLOL TARTRATE 50 MG PO TABS
50.0000 mg | ORAL_TABLET | Freq: Two times a day (BID) | ORAL | Status: DC
  Administered 2018-08-17 – 2018-08-24 (×14): 50 mg via ORAL
  Filled 2018-08-17 (×14): qty 1

## 2018-08-17 MED ORDER — ONDANSETRON HCL 4 MG PO TABS: 4.0000 mg | ORAL_TABLET | Freq: Four times a day (QID) | ORAL | Status: DC | PRN

## 2018-08-17 MED ORDER — DOCUSATE SODIUM 100 MG PO CAPS
100.0000 mg | ORAL_CAPSULE | Freq: Two times a day (BID) | ORAL | Status: DC
  Administered 2018-08-18 – 2018-08-24 (×9): 100 mg via ORAL
  Filled 2018-08-17 (×11): qty 1

## 2018-08-17 MED ORDER — ENSURE ENLIVE PO LIQD
237.0000 mL | Freq: Two times a day (BID) | ORAL | Status: DC
  Administered 2018-08-18 – 2018-08-24 (×6): 237 mL via ORAL

## 2018-08-17 MED ORDER — PREGABALIN 75 MG PO CAPS
150.0000 mg | ORAL_CAPSULE | Freq: Two times a day (BID) | ORAL | Status: DC
  Administered 2018-08-17 – 2018-08-24 (×13): 150 mg via ORAL
  Filled 2018-08-17 (×13): qty 2

## 2018-08-17 MED ORDER — ACETAMINOPHEN 650 MG RE SUPP
650.0000 mg | Freq: Four times a day (QID) | RECTAL | Status: DC | PRN
  Administered 2018-08-20: 650 mg via RECTAL
  Filled 2018-08-17: qty 1

## 2018-08-17 NOTE — H&P (Signed)
Subjective: Patient is a 77 y.o. female who complains of postop wound dehiscence. She has a lumbar lami about 5 weeks ago. Her wound had been healing by secondary intention with a home health nurse doing dressing changes. According to the husband the wound has "opened up more" since their last visit with Dr. Zada Finders in the office. The wound has also become red around the edges and draining more since Monday. Denies any fevers or chills. No increase in back pain. Husbands states that it is hot and has and odorous smell to it now. States that she was put on some antibiotics a week after surgery.   Past Medical History:  Diagnosis Date  . Abnormal weight gain   . Abnormality of gait 04/19/2016  . Acute infective polyneuritis ([place]) 2002  . Acute maxillary sinusitis   . Acute sinusitis, unspecified   . Allergic rhinitis due to pollen   . Arthritis   . Back injury   . Candidiasis of skin and nails   . Candidiasis of vulva and vagina   . Chronic pain syndrome   . COPD (chronic obstructive pulmonary disease) ([place])   . Cough   . Degenerative arthritis   . Depressive disorder, not elsewhere classified   . Diabetes mellitus without complication ([place])   . Diaphragmatic hernia without mention of obstruction or gangrene   . Dyslipidemia   . Edema   . Fibromyalgia   . GERD (gastroesophageal reflux disease)   . Guillain-Barre syndrome ([place])   . History of benign thymus tumor   . Hypertension   . Insomnia, unspecified   . Lumbago   . Lumbar spinal stenosis 03/30/2018   L4-5 level, severe  . Miscarriage 1962  . Mixed hyperlipidemia   . Morbid obesity ([place])   . Obstructive chronic bronchitis with acute bronchitis ([place])   . Osteopenia, senile   . Osteoporosis   . Other malaise and fatigue   . Other specified disease of white blood cells   . Pain in joint, multiple sites   . Pneumonia   . Polyneuropathy in diabetes(357.2)   . RA (rheumatoid arthritis) ([place])   . Reflux esophagitis   .  Rheumatoid arthritis(714.0)   . Shortness of breath   . Spinal stenosis, lumbar region, without neurogenic claudication   . Spondylosis, lumbosacral   . Spontaneous ecchymoses   . Stiffness of joints, not elsewhere classified, multiple sites   . Tear film insufficiency, unspecified   . Thyroid disease   . Thyroid disorder   . Type II or unspecified type diabetes mellitus with peripheral circulatory disorders, uncontrolled(250.72)   . Unspecified chronic bronchitis ([place])   . Unspecified essential hypertension   . Unspecified hypothyroidism   . Unspecified pruritic disorder   . Unspecified urinary incontinence   . Urinary tract infection, site not specified     Past Surgical History:  Procedure Laterality Date  . ABDOMINAL HYSTERECTOMY  1974  . abdominal tumor  2002  . APPENDECTOMY    . [place]  . BRONCHOSCOPY  2001  . CHOLECYSTECTOMY  1984  . KNEE SURGERY Bilateral 08/27/2010 (L) and 01/11/2011 (R)  . LUMBAR LAMINECTOMY/DECOMPRESSION MICRODISCECTOMY N/A 07/03/2018   Procedure: Lumbar Three to Lumbar Five Laminectomy;  Surgeon: Judith Part, MD;  Location: [place];  Service: Neurosurgery;  Laterality: N/A;  . miscarrage  1962  . OVARIAN CYST SURGERY  1968  . thymus tumor    . 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 TONSILLECTOMY  Allergies  Allergen Reactions  . Influenza Vaccines Other (See Comments)    "h/o Guillain Barre; dr's told me years ago never to take another flu shot as it could relapse Rosalee Kaufman; has to do with when vaccine being changed to H1N1 virus"  . Penicillins Hives, Itching, Swelling and Rash    Has patient had a PCN reaction causing immediate rash, facial/tongue/throat swelling, SOB or lightheadedness with hypotension:Unknown Has patient had a PCN reaction causing severe rash involving mucus membranes or skin necrosis: Unknown Has patient had a PCN reaction that required hospitalization:Unknown Has patient had a PCN reaction occurring within the  last 10 years: Unknown If all of the above answers are "NO", then may proceed with Cephalosporin use.   . Statins Other (See Comments)    Myopathy, transaminitis  . Sulfamethoxazole-Trimethoprim Itching    Social History   Tobacco Use  . Smoking status: Former Smoker    Types: Cigarettes    Last attempt to quit: 12/07/1979    Years since quitting: 38.7  . Smokeless tobacco: Never Used  Substance Use Topics  . Alcohol use: No    Alcohol/week: 0.0 standard drinks    Family History  Problem Relation Age of Onset  . Alzheimer's disease Mother   . Heart disease Mother   . Heart disease Father   . Liver disease Father   . Cancer Brother   . Arthritis Son   . Colon polyps Brother   . Colon cancer Neg Hx   . Esophageal cancer Neg Hx   . Kidney disease Neg Hx   . Stomach cancer Neg Hx   . Rectal cancer Neg Hx    Prior to Admission medications   Medication Sig Start Date End Date Taking? Authorizing Provider  albuterol (PROVENTIL HFA;VENTOLIN HFA) 108 (90 Base) MCG/ACT inhaler Inhale 2 puffs into the lungs every 6 (six) hours as needed for wheezing or shortness of breath. 06/05/17   Reed, Tiffany L, DO  amLODipine (NORVASC) 10 MG tablet Take 1 tablet (10 mg total) by mouth daily. Please schedule appointment for refills. 03/06/18   Gildardo Cranker, DO  B-D UF III MINI PEN NEEDLES 31G X 5 MM MISC USE AS DIRECTED 12/18/17   Reed, Tiffany L, DO  cadexomer iodine (IODOSORB) 0.9 % gel Apply 1 application topically daily as needed for wound care. 03/07/18   Wallene Huh, DPM  docusate sodium (COLACE) 100 MG capsule Take 1 capsule (100 mg total) by mouth 2 (two) times daily. 07/10/18   Judith Part, MD  folic acid (FOLVITE) 1 MG tablet Take 2 tablets (2 mg total) by mouth daily. 11/15/16   Panwala, Naitik, PA-C  furosemide (LASIX) 20 MG tablet TAKE 1 TABLET BY MOUTH TWICE DAILY AS NEEDED FOR 3 POUND WEIGHT GAIN IN ONE DAY OR 5 POUNDS IN ONE WEEK 08/18/17   Reed, Tiffany L, DO  glucose  blood (ACCU-CHEK SMARTVIEW) test strip Check blood sugar 3-4 times daily    [provider]  HUMALOG KWIKPEN 200 UNIT/ML SOPN INJECT 20 UNITS UNDER THE SKIN THREE TIMES DAILY BEFORE A MEAL 03/19/18   Reed, Tiffany L, DO  HYDROcodone-acetaminophen (NORCO) 10-325 MG tablet Take 1 tablet by mouth every 3 (three) hours as needed for moderate pain. 07/27/18   Reed, Tiffany L, DO  levothyroxine (SYNTHROID, LEVOTHROID) 137 MCG tablet TAKE 1 TABLET BY MOUTH DAILY BEFORE BREAKFAST ON AN EMPTY STOMACH( LABS OVERDUE) 05/21/18   Reed, Tiffany L, DO  lisinopril (PRINIVIL,ZESTRIL) 20 MG tablet Take 1  tablet (20 mg total) by mouth daily. Please schedule appointment for refills. 06/18/18   Lelon Perla, MD  loperamide (IMODIUM A-D) 2 MG tablet Take 2 mg by mouth 4 (four) times daily as needed for diarrhea or loose stools.    [provider]  magnesium oxide (MAG-OX) 400 MG tablet Take 400 mg by mouth daily as needed (for leg cramps).     [provider]  Melatonin 10 MG CAPS Take 10 mg by mouth at bedtime.     [provider]  metoprolol tartrate (LOPRESSOR) 50 MG tablet Take 50 mg by mouth 2 (two) times daily.  04/26/17   [provider]  MYRBETRIQ 50 MG TB24 tablet TAKE 1 TABLET(50 MG) BY MOUTH DAILY 05/31/18   Reed, Tiffany L, DO  nystatin (NYSTATIN) powder Apply topically daily as needed. 12/15/17   Reed, Tiffany L, DO  nystatin cream (MYCOSTATIN) Apply 1 application topically 2 (two) times daily. To skin folds 05/24/18   Reed, Tiffany L, DO  omega-3 acid ethyl esters (LOVAZA) 1 g capsule TAKE ONE CAPSULE BY MOUTH EVERY DAY 08/04/16   Reed, Tiffany L, DO  ondansetron (ZOFRAN) 4 MG tablet Take 1 tablet (4 mg total) by mouth every 8 (eight) hours as needed for nausea or vomiting. 08/02/18   Reed, Tiffany L, DO  oxyCODONE (OXY IR/ROXICODONE) 5 MG immediate release tablet Take 1 tablet (5 mg total) by mouth every 8 (eight) hours as needed for up to 7 days (pain). 08/13/18  08/20/18  Reed, Tiffany L, DO  polyvinyl alcohol (LIQUIFILM TEARS) 1.4 % ophthalmic solution Place 1 drop into both eyes 4 (four) times daily as needed (for dry/irritated eyes).     [provider]  potassium chloride (MICRO-K) 10 MEQ CR capsule Take 2 capsules (20 mEq total) by mouth 2 (two) times daily as needed (when taking a dose of lasix.). 08/09/18   Reed, Tiffany L, DO  pregabalin (LYRICA) 150 MG capsule Take 1 capsule (150 mg total) by mouth 2 (two) times daily. 10/05/17   Reed, Tiffany L, DO  tiZANidine (ZANAFLEX) 2 MG tablet Take 1 tablet (2 mg total) by mouth 3 (three) times daily. 03/30/18   Kathrynn Ducking, MD  Tofacitinib Citrate (XELJANZ) 5 MG TABS Take 1 tablet (5 mg total) by mouth 2 (two) times daily. 05/24/18   Ofilia Neas, PA-C  TOUJEO SOLOSTAR 300 UNIT/ML SOPN INJECT 100 UNITS UNDER THE SKIN EVERY NIGHT AT BEDTIME 04/02/18   Reed, Tiffany L, DO  triamterene-hydrochlorothiazide (MAXZIDE-25) 37.5-25 MG tablet TAKE 1 TABLET BY MOUTH DAILY 06/18/18   Reed, Tiffany L, DO  umeclidinium-vilanterol (ANORO ELLIPTA) 62.5-25 MCG/INH AEPB Inhale 1 puff into the lungs daily. 06/01/17   Reed, Tiffany L, DO  venlafaxine XR (EFFEXOR-XR) 75 MG 24 hr capsule Take one capsule by mouth once daily with breakfast 07/26/18   Reed, Tiffany L, DO  VICTOZA 18 MG/3ML SOPN ADMINISTER 1.8 MG UNDER THE SKIN DAILY FOR BLOOD SUGAR 03/05/18   Reed, Tiffany L, DO     Review of Systems  Positive ROS: as above  All other systems have been reviewed and were otherwise negative with the exception of those mentioned in the HPI and as above.  Objective: Vital signs in last 24 hours: Temp:  [98.3 F (36.8 C)] 98.3 F (36.8 C) (12/27 1627) Pulse Rate:  [64] 64 (12/27 1627) Resp:  [16] 16 (12/27 1627) BP: (121)/(75) 121/75 (12/27 1627) SpO2:  [95 %] 95 % (12/27 1627)  General Appearance:  Alert, cooperative, no distress, appears stated age Head: Normocephalic, without obvious abnormality,  atraumatic Eyes: PERRL, conjunctiva/corneas clear, EOM's intact, fundi benign, both eyes      Lungs:  respirations unlabored Heart: Regular rate and rhythm Skin: Skin color, texture, turgor normal, no rashes or lesions, lumbar wound dehiscence with erythema noted around the edges.   NEUROLOGIC:   Mental status: alert and oriented, no aphasia, good attention span, Fund of knowledge/ memory ok Motor Exam - grossly normal Sensory Exam - grossly normal Reflexes:  Coordination - not tested Gait - not tested Balance - not tested Cranial Nerves: I: smell Not tested  II: visual acuity  OS: na   OD: na  II: visual fields Full to confrontation  II: pupils   III,VII: ptosis   III,IV,VI: extraocular muscles    V: mastication   V: facial light touch sensation    V,VII: corneal reflex    VII: facial muscle function - upper    VII: facial muscle function - lower   VIII: hearing   IX: soft palate elevation    IX,X: gag reflex   XI: trapezius strength    XI: sternocleidomastoid strength   XI: neck flexion strength    XII: tongue strength      Data Review Lab Results  Component Value Date   WBC 17.2 (H) 08/09/2018   HGB 13.9 08/09/2018   HCT 42.9 08/09/2018   MCV 84.8 08/09/2018   PLT 430 (H) 08/09/2018   Lab Results  Component Value Date   NA 142 08/09/2018   K 4.1 08/09/2018   CL 104 08/09/2018   CO2 30 08/09/2018   BUN 19 08/09/2018   CREATININE 1.00 (H) 08/09/2018   GLUCOSE 45 (L) 08/09/2018   No results found for: INR, PROTIME  Assessment/Plan: 77 year old direct admit from the office with wound dehiscence and possible infection. Wound appears to have some eschar with yellow discharge and erythremic edges. Will admit to get labs and wound care. Dr. Zada Finders will likely debride the wound tomorrow. NPO after midnight.   Katherine Delgado 08/17/2018 5:05 PM

## 2018-08-17 NOTE — Progress Notes (Signed)
Office Visit Note  Patient: Katherine Delgado             Date of Birth: [birth year]           MRN: [id number]             PCP: Gayland Curry, DO Referring: Gayland Curry, DO Visit Date: 08/28/2018 Occupation: @GUAROCC @  Subjective:  Pain in multiple joints.   History of Present Illness: Katherine Delgado is a 77 y.o. female history of rheumatoid arthritis osteoarthritis Sjogren's, fibromyalgia and degenerative disc disease.  Patient states she has been off methotrexate and Morrie Sheldon since May 2019.  She states in November she underwent lumbar spine surgery and had infection.  She had repeat surgery 8 days ago and was discharged on August 24, 2018.  Has follow-up appointment with the spine surgeon.  She states she has been having increased joint pain and discomfort due to rheumatoid arthritis.  She has been having discomfort in her right knee joint.  Activities of Daily Living:  Patient reports morning stiffness for 1 hour.   Patient Reports nocturnal pain.  Difficulty dressing/grooming: Reports Difficulty climbing stairs: Reports Difficulty getting out of chair: Reports Difficulty using hands for taps, buttons, cutlery, and/or writing: Denies  Review of Systems  Constitutional: Positive for fatigue. Negative for night sweats, weight gain and weight loss.  HENT: Positive for mouth dryness. Negative for mouth sores, trouble swallowing, trouble swallowing and nose dryness.   Eyes: Positive for dryness. Negative for pain, redness and visual disturbance.  Respiratory: Negative for cough, shortness of breath and difficulty breathing.   Cardiovascular: Negative for chest pain, palpitations, hypertension, irregular heartbeat and swelling in legs/feet.  Gastrointestinal: Negative for blood in stool, constipation and diarrhea.  Endocrine: Negative for increased urination.  Genitourinary: Negative for vaginal dryness.  Musculoskeletal: Positive for arthralgias, joint pain and morning stiffness.  Negative for joint swelling, myalgias, muscle weakness, muscle tenderness and myalgias.  Skin: Negative for color change, rash, hair loss, skin tightness, ulcers and sensitivity to sunlight.  Allergic/Immunologic: Negative for susceptible to infections.  Neurological: Negative for dizziness, memory loss, night sweats and weakness.  Hematological: Negative for swollen glands.  Psychiatric/Behavioral: Positive for sleep disturbance. Negative for depressed mood. The patient is not nervous/anxious.     PMFS History:  Patient Active Problem List   Diagnosis Date Noted  . Wound dehiscence 08/17/2018  . Lumbar spinal stenosis 03/30/2018  . Ulcer of toe of left foot, with necrosis of bone ([place]) 07/25/2016  . Rheumatoid arthritis with positive rheumatoid factor ([place]) 06/14/2016  . High risk medication use 06/14/2016  . Sjogren's syndrome ([place]) 06/14/2016  . Primary osteoarthritis of both knees 06/14/2016  . DDD (degenerative disc disease), lumbar 06/14/2016  . Hypothyroidism 06/14/2016  . Osteoporosis 06/14/2016  . Abnormality of gait 04/19/2016  . Type 2 diabetes mellitus with diabetic polyneuropathy, with long-term current use of insulin ([place]) 11/27/2015  . Severe obesity (BMI >= 40) ([place]) 04/21/2014  . Type II or unspecified type diabetes mellitus with peripheral circulatory disorders, uncontrolled(250.72) 09/12/2013  . Headache(784.0) 07/31/2013  . Sinus infection 07/31/2013  . Chronic low back pain 03/14/2013  . Insomnia 12/06/2012  . Nausea alone 12/06/2012  . Diarrhea 12/06/2012  . Urinary incontinence, urge 12/06/2012  . Lumbosacral root lesions, not elsewhere classified 11/27/2012  . Diabetic polyneuropathy associated with type 2 diabetes mellitus ([place]) 11/27/2012  . EDEMA 05/04/2010  . YEAST INFECTION 05/03/2010  . Hyperlipidemia 05/03/2010  . DEPRESSION 05/03/2010  . History of  peripheral neuropathy 05/03/2010  . GUILLAIN-BARRE SYNDROME 05/03/2010  . Essential hypertension  05/03/2010  . ALLERGIC RHINITIS 05/03/2010  . PNEUMONIA 05/03/2010  . COPD (chronic obstructive pulmonary disease) ([place]) 05/03/2010  . GERD 05/03/2010  . Fibromyalgia 05/03/2010  . DYSPNEA 05/03/2010  . CHEST PAIN 05/03/2010    Past Medical History:  Diagnosis Date  . Abnormal weight gain   . Abnormality of gait 04/19/2016  . Acute infective polyneuritis ([place]) 2002  . Allergic rhinitis due to pollen   . Chronic pain syndrome   . COPD (chronic obstructive pulmonary disease) ([facility])    chronic bronchitis  . Depressive disorder, not elsewhere classified   . Diaphragmatic hernia without mention of obstruction or gangrene   . Dyslipidemia   . Fibromyalgia   . GERD (gastroesophageal reflux disease)    with h/o esophagitis  . Guillain-Barre syndrome ([place])   . History of benign thymus tumor   . Insomnia, unspecified   . Lumbar spinal stenosis 03/30/2018   L4-5 level, severe  . Miscarriage 1962  . Mixed hyperlipidemia   . Morbid obesity ([place])   . Osteoporosis   . Pneumonia   . Polyneuropathy in diabetes(357.2)   . Rheumatoid arthritis(714.0)   . Spondylosis, lumbosacral   . Spontaneous ecchymoses   . Tear film insufficiency, unspecified   . Type II or unspecified type diabetes mellitus with peripheral circulatory disorders, uncontrolled(250.72)   . Unspecified chronic bronchitis ([place])   . Unspecified essential hypertension   . Unspecified hypothyroidism   . Unspecified pruritic disorder   . Unspecified urinary incontinence     Family History  Problem Relation Age of Onset  . Alzheimer's disease Mother   . Heart disease Mother   . Heart disease Father   . Liver disease Father   . Cancer Brother   . Arthritis Son   . Colon polyps Brother   . Colon cancer Neg Hx   . Esophageal cancer Neg Hx   . Kidney disease Neg Hx   . Stomach cancer Neg Hx   . Rectal cancer Neg Hx    Past Surgical History:  Procedure Laterality Date  . ABDOMINAL HYSTERECTOMY  1974  . abdominal tumor   2002  . APPENDECTOMY    . [place]  . BRONCHOSCOPY  2001  . CHOLECYSTECTOMY  1984  . KNEE SURGERY Bilateral 08/27/2010 (L) and 01/11/2011 (R)  . LUMBAR LAMINECTOMY/DECOMPRESSION MICRODISCECTOMY N/A 07/03/2018   Procedure: Lumbar Three to Lumbar Five Laminectomy;  Surgeon: Judith Part, MD;  Location: [facility];  Service: Neurosurgery;  Laterality: N/A;  . LUMBAR WOUND DEBRIDEMENT N/A 08/20/2018   Procedure: POSTERIOR LUMBAR SPINAL WOUND DEBRIDEMENT AND REVISION;  Surgeon: Judith Part, MD;  Location: [facility];  Service: Neurosurgery;  Laterality: N/A;  POSTERIOR LUMBAR SPINAL WOUND REVISION  . [place]  . OVARIAN CYST SURGERY  1968  . thymus tumor    . 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 TONSILLECTOMY     Social History   Social History Narrative   Walks with cane   Right handed    Caffeine use: Coffee (2 cups every morning)   Tea: sometimes   Soda: none    Objective: Vital Signs: BP 137/74 (BP Location: Right Arm, Patient Position: Sitting, Cuff Size: Small)   Pulse 71   Resp 14   Ht 5\' 6"  (1.676 m)   Wt 236 lb 12.8 oz (107.4 kg)   BMI 38.22 kg/m    Physical Exam Vitals signs and nursing  note reviewed. Chaperone present: in wheel chair.  Constitutional:      Appearance: She is well-developed.  HENT:     Head: Normocephalic and atraumatic.  Eyes:     Conjunctiva/sclera: Conjunctivae normal.  Neck:     Musculoskeletal: Normal range of motion.  Cardiovascular:     Rate and Rhythm: Normal rate and regular rhythm.     Heart sounds: Normal heart sounds.  Pulmonary:     Effort: Pulmonary effort is normal.     Breath sounds: Normal breath sounds.  Abdominal:     General: Bowel sounds are normal.     Palpations: Abdomen is soft.  Lymphadenopathy:     Cervical: No cervical adenopathy.  Skin:    General: Skin is warm and dry.     Capillary Refill: Capillary refill takes less than 2 seconds.  Neurological:     Mental Status: She is alert and oriented to  person, place, and time.  Psychiatric:        Behavior: Behavior normal.      Musculoskeletal Exam: C-spine in good range of motion.  She was in a wheelchair she had recent lumbar spine surgery.  Shoulder joints elbow joints with good range of motion.  She had some edema on her right hand dorsal aspect due to infiltration from IV.  Synovial thickening was noted but no synovitis was noted over MCP joints.  DIP and PIP thickening and CMC prominence was noted in bilateral hands.  Hip joints were difficult to assess in the wheelchair.  She had some warmth in her right knee joint.  Ankle joints with good range of motion.  CDAI Exam: CDAI Score: 2  Patient Global Assessment: 7 (mm); Provider Global Assessment: 3 (mm) Swollen: 0 ; Tender: 1  Joint Exam      Right  Left  Knee   Tender        Investigation: No additional findings.  Imaging: No results found.  Recent Labs: Lab Results  Component Value Date   WBC 13.3 (H) 08/17/2018   HGB 12.9 08/17/2018   PLT 292 08/17/2018   NA 135 08/22/2018   K 4.0 08/22/2018   CL 99 08/22/2018   CO2 28 08/22/2018   GLUCOSE 270 (H) 08/22/2018   BUN 22 08/22/2018   CREATININE 0.93 08/22/2018   BILITOT 0.4 08/09/2018   ALKPHOS 113 02/20/2017   AST 17 08/09/2018   ALT 9 08/09/2018   PROT 6.5 08/09/2018   ALBUMIN 2.6 (L) 08/22/2018   CALCIUM 8.5 (L) 08/22/2018   GFRAA >60 08/22/2018    Speciality Comments: No specialty comments available.  Procedures:  No procedures performed Allergies: Influenza vaccines; Penicillins; Statins; and Sulfamethoxazole-trimethoprim   Assessment / Plan:     Visit Diagnoses: Rheumatoid arthritis involving multiple sites with positive rheumatoid factor (HCC)-she is to a lot of pain and discomfort.  She had no synovitis on examination today.  She has synovial thickening.  She has been off her DMARDs due to recurrent infections.  She will continue to hold medications for right now.  Sjogren's syndrome with  keratoconjunctivitis sicca (HCC)-over-the-counter products were discussed.  High risk medication use-she has taken methotrexate and Xeljanz in the past.  Primary osteoarthritis of both knees-she has severe rheumatoid and osteoarthritis in her knee joints.  Her right knee joint has some warmth and discomfort.  We decided not to inject with cortisone as she is diabetic and her blood sugars have been running high.  She has done Visco supplement injections in the  past.  We will apply for Visco supplement injection to the right knee joint.  DDD (degenerative disc disease), lumbar-she had recent surgery and had secondary infection.  She states she had repeat surgery this month.  She is gradually recovering from that.  Fibromyalgia-she continues to have some generalized pain and discomfort.  Age-related osteoporosis without current pathological fracture  Other insomnia  History of Guillain-Barre syndrome  History of depression  History of hypertension  History of COPD  History of peripheral neuropathy  History of diabetes mellitus  History of gastroesophageal reflux (GERD)   Orders: No orders of the defined types were placed in this encounter.  No orders of the defined types were placed in this encounter.    Follow-Up Instructions: Return in about 3 months (around 11/27/2018) for Rheumatoid arthritis.   Bo Merino, MD  Note - This record has been created using Editor, commissioning.  Chart creation errors have been sought, but may not always  have been located. Such creation errors do not reflect on  the standard of medical care.

## 2018-08-18 LAB — GLUCOSE, CAPILLARY
GLUCOSE-CAPILLARY: 242 mg/dL — AB (ref 70–99)
Glucose-Capillary: 172 mg/dL — ABNORMAL HIGH (ref 70–99)
Glucose-Capillary: 129 mg/dL — ABNORMAL HIGH (ref 70–99)

## 2018-08-18 MED ORDER — ALUM & MAG HYDROXIDE-SIMETH 200-200-20 MG/5ML PO SUSP
30.0000 mL | Freq: Four times a day (QID) | ORAL | Status: DC | PRN
  Administered 2018-08-18: 30 mL via ORAL
  Filled 2018-08-18: qty 30

## 2018-08-18 MED ORDER — PRO-STAT SUGAR FREE PO LIQD
30.0000 mL | Freq: Two times a day (BID) | ORAL | Status: DC
  Administered 2018-08-18 – 2018-08-24 (×11): 30 mL via ORAL
  Filled 2018-08-18 (×11): qty 30

## 2018-08-18 NOTE — Plan of Care (Signed)

## 2018-08-18 NOTE — Progress Notes (Signed)
  NEUROSURGERY PROGRESS NOTE   No issues overnight.  Complains of moderate back pain, no LE symptoms  EXAM:  BP (!) 155/54 (BP Location: Left Arm)   Pulse 74   Temp 98.5 F (36.9 C) (Oral)   Resp 14   Ht '5\' 6"'$  (6.060 m)   Wt 115 kg   SpO2 96%   BMI 40.92 kg/m   Awake, alert, oriented  Speech fluent, appropriate  CN grossly intact  MAEW with good strength Lumbar wound wound dehiscence with erythema surrounding incision at edges. No active drainage.   IMPRESSION/PLAN 77 y.o. female with lumbar wound dehiscence s/p L3-5 laminectomy on 07/03/2018. She is nontoxic. Labs reviewed (stable chronic leukocytosis, no significant CRP/ESR elevation). Does not appear septic. No immediate need for wound debridement/exploration. Dr Zada Finders to proceed with wound debridement Monday. Continue supportive care. Regular diet.

## 2018-08-18 NOTE — Progress Notes (Signed)
Initial Nutrition Assessment  DOCUMENTATION CODES:   Morbid obesity  INTERVENTION:    Prostat liquid protein po 30 ml BID with meals, each supplement provides 100 kcal, 15 grams protein  Ensure Enlive po BID, each supplement provides 350 kcal and 20 grams of protein  NUTRITION DIAGNOSIS:   Increased nutrient needs related to wound healing as evidenced by estimated needs  GOAL:   Patient will meet greater than or equal to 90% of their needs  MONITOR:   PO intake, Supplement acceptance, Labs, Weight trends, Skin, I & O's  REASON FOR ASSESSMENT:   Malnutrition Screening Tool  ASSESSMENT:   77 y.o. Female who complains of postop wound dehiscence. She has a lumbar laminectomy about 5 weeks ago.   RD spoke with pt at bedside. She reports she ate fairly well this AM. Pt shared she's not having surgery today.  States she typically consumes 3 meals per day. Doesn't usually drink nutrition supplements. Pt reveals her weight has been overall stable.  She is amenable to receive Prostat liquid protein. Labs & medications reviewed.  CBG's [phone].  NUTRITION - FOCUSED PHYSICAL EXAM:  Completed. No muscle or fat depletions noticed.  Diet Order:   Diet Order            Diet regular Room service appropriate? Yes; Fluid consistency: Thin  Diet effective now             EDUCATION NEEDS:   No education needs have been identified at this time  Skin:  Skin Assessment: Skin Integrity Issues: Skin Integrity Issues:: Incisions Incisions: back  Last BM:  12/27  Height:   Ht Readings from Last 1 Encounters:  08/17/18 5\' 6"  (1.676 m)   Weight:   Wt Readings from Last 1 Encounters:  08/17/18 115 kg   BMI:  Body mass index is 40.92 kg/m.  Estimated Nutritional Needs:   Kcal:  1800-2000  Protein:  100-115 gm  Fluid:  1.8-2.0 L  Arthur Holms, RD, LDN Pager #: [phone] After-Hours Pager #: [phone]

## 2018-08-18 NOTE — Consult Note (Signed)
[place] Nurse wound consult note Reason for Consult: Consultation to [facility] nurse simultaneously placed with consultation to neurosurgery for this patient with a dehisced surgical wound.  Seen by Ronnell Guadalajara and orders placed for twice-daily NS dressings to the wound.  Plan is for MD Ostergard to debride wound in OR on Monday, 08/20/18. A change in this POC is beyond the scope of [place]. Wound type: Dehisced surgical wound Pressure Injury POA: Yes/No/NA  WOC nursing team will not follow, but will remain available to this patient, the nursing and medical teams.  Please re-consult if needed. Thanks, Maudie Flakes, MSN, RN, [place], Arther Abbott  Pager# [phone]

## 2018-08-19 LAB — GLUCOSE, CAPILLARY
GLUCOSE-CAPILLARY: 196 mg/dL — AB (ref 70–99)
Glucose-Capillary: 312 mg/dL — ABNORMAL HIGH (ref 70–99)
Glucose-Capillary: 131 mg/dL — ABNORMAL HIGH (ref 70–99)
Glucose-Capillary: 252 mg/dL — ABNORMAL HIGH (ref 70–99)

## 2018-08-19 NOTE — Plan of Care (Signed)

## 2018-08-19 NOTE — Progress Notes (Signed)
Patient ID: Katherine Delgado, female   DOB: Sep 27, 1940, 77 y.o.   MRN: [id number] Patient stable.  Denies pain.  Sitting up eating.  Moves all extremities.  Plan for wound revision tomorrow.

## 2018-08-20 ENCOUNTER — Inpatient Hospital Stay (HOSPITAL_COMMUNITY): Payer: Medicare Other | Admitting: Certified Registered"

## 2018-08-20 ENCOUNTER — Encounter (HOSPITAL_COMMUNITY)
Admission: AD | Disposition: A | Discharge status: Home Health | Payer: Self-pay | Source: Ambulatory Visit | Attending: Neurological Surgery

## 2018-08-20 ENCOUNTER — Encounter (HOSPITAL_COMMUNITY): Payer: Self-pay | Admitting: Orthopedic Surgery

## 2018-08-20 HISTORY — PX: LUMBAR WOUND DEBRIDEMENT: SHX1988

## 2018-08-20 LAB — GLUCOSE, CAPILLARY
GLUCOSE-CAPILLARY: 276 mg/dL — AB (ref 70–99)
Glucose-Capillary: 260 mg/dL — ABNORMAL HIGH (ref 70–99)
Glucose-Capillary: 180 mg/dL — ABNORMAL HIGH (ref 70–99)
Glucose-Capillary: 186 mg/dL — ABNORMAL HIGH (ref 70–99)
Glucose-Capillary: 214 mg/dL — ABNORMAL HIGH (ref 70–99)
Glucose-Capillary: 249 mg/dL — ABNORMAL HIGH (ref 70–99)
Glucose-Capillary: 208 mg/dL — ABNORMAL HIGH (ref 70–99)

## 2018-08-20 SURGERY — LUMBAR WOUND DEBRIDEMENT
Anesthesia: General | Site: Spine Lumbar

## 2018-08-20 MED ORDER — FENTANYL CITRATE (PF) 100 MCG/2ML IJ SOLN
INTRAMUSCULAR | Status: AC
  Filled 2018-08-20: qty 2

## 2018-08-20 MED ORDER — HYDROMORPHONE HCL 1 MG/ML IJ SOLN
INTRAMUSCULAR | Status: AC
  Filled 2018-08-20: qty 1

## 2018-08-20 MED ORDER — LIDOCAINE 2% (20 MG/ML) 5 ML SYRINGE
INTRAMUSCULAR | Status: AC
  Filled 2018-08-20: qty 5

## 2018-08-20 MED ORDER — SUGAMMADEX SODIUM 200 MG/2ML IV SOLN
INTRAVENOUS | Status: DC | PRN
  Administered 2018-08-20: 230 mg via INTRAVENOUS

## 2018-08-20 MED ORDER — LIDOCAINE HCL (CARDIAC) PF 100 MG/5ML IV SOSY
PREFILLED_SYRINGE | INTRAVENOUS | Status: DC | PRN
  Administered 2018-08-20: 60 mg via INTRAVENOUS

## 2018-08-20 MED ORDER — OXYCODONE HCL 5 MG PO TABS
ORAL_TABLET | ORAL | Status: AC
  Filled 2018-08-20: qty 1

## 2018-08-20 MED ORDER — ONDANSETRON HCL 4 MG/2ML IJ SOLN: 4.0000 mg | Freq: Once | INTRAMUSCULAR | Status: DC | PRN

## 2018-08-20 MED ORDER — FENTANYL CITRATE (PF) 100 MCG/2ML IJ SOLN
INTRAMUSCULAR | Status: DC | PRN
  Administered 2018-08-20 (×5): 50 ug via INTRAVENOUS

## 2018-08-20 MED ORDER — SODIUM CHLORIDE 0.9 % IV SOLN
INTRAVENOUS | Status: DC | PRN
  Administered 2018-08-20: 50 ug/min via INTRAVENOUS

## 2018-08-20 MED ORDER — LIDOCAINE-EPINEPHRINE 1 %-1:100000 IJ SOLN
INTRAMUSCULAR | Status: DC | PRN
  Administered 2018-08-20: 10 mL

## 2018-08-20 MED ORDER — INSULIN ASPART 100 UNIT/ML ~~LOC~~ SOLN
SUBCUTANEOUS | Status: AC
  Filled 2018-08-20: qty 1

## 2018-08-20 MED ORDER — LACTATED RINGERS IV SOLN
INTRAVENOUS | Status: DC
  Administered 2018-08-20: 15:00:00 via INTRAVENOUS

## 2018-08-20 MED ORDER — MIDAZOLAM HCL 2 MG/2ML IJ SOLN
INTRAMUSCULAR | Status: AC
  Filled 2018-08-20: qty 2

## 2018-08-20 MED ORDER — FENTANYL CITRATE (PF) 250 MCG/5ML IJ SOLN
INTRAMUSCULAR | Status: AC
  Filled 2018-08-20: qty 5

## 2018-08-20 MED ORDER — OXYCODONE HCL 5 MG/5ML PO SOLN: 5.0000 mg | Freq: Once | ORAL | Status: AC | PRN

## 2018-08-20 MED ORDER — SODIUM CHLORIDE 0.9 % IV SOLN
INTRAVENOUS | Status: DC | PRN
  Administered 2018-08-20: 15:00:00

## 2018-08-20 MED ORDER — LIDOCAINE-EPINEPHRINE 1 %-1:100000 IJ SOLN
INTRAMUSCULAR | Status: AC
  Filled 2018-08-20: qty 1

## 2018-08-20 MED ORDER — HYDROMORPHONE HCL 1 MG/ML IJ SOLN
0.2500 mg | INTRAMUSCULAR | Status: AC | PRN
  Administered 2018-08-20 (×2): 0.5 mg via INTRAVENOUS
  Administered 2018-08-20 (×2): 0.25 mg via INTRAVENOUS

## 2018-08-20 MED ORDER — ROCURONIUM BROMIDE 100 MG/10ML IV SOLN
INTRAVENOUS | Status: DC | PRN
  Administered 2018-08-20: 50 mg via INTRAVENOUS

## 2018-08-20 MED ORDER — PROPOFOL 500 MG/50ML IV EMUL
INTRAVENOUS | Status: DC | PRN
  Administered 2018-08-20: 25 ug/kg/min via INTRAVENOUS

## 2018-08-20 MED ORDER — OXYCODONE HCL 5 MG PO TABS
5.0000 mg | ORAL_TABLET | Freq: Once | ORAL | Status: AC | PRN
  Administered 2018-08-20: 5 mg via ORAL

## 2018-08-20 MED ORDER — PROPOFOL 10 MG/ML IV BOLUS
INTRAVENOUS | Status: DC | PRN
  Administered 2018-08-20: 130 mg via INTRAVENOUS

## 2018-08-20 MED ORDER — VANCOMYCIN HCL 1000 MG IV SOLR
INTRAVENOUS | Status: DC | PRN
  Administered 2018-08-20: 1000 mg via INTRAVENOUS

## 2018-08-20 MED ORDER — THROMBIN 5000 UNITS EX SOLR
OROMUCOSAL | Status: DC | PRN
  Administered 2018-08-20: 15:00:00

## 2018-08-20 MED ORDER — HYDROMORPHONE HCL 1 MG/ML IJ SOLN
0.5000 mg | Freq: Once | INTRAMUSCULAR | Status: DC
  Filled 2018-08-20: qty 0.5

## 2018-08-20 MED ORDER — BACITRACIN ZINC 500 UNIT/GM EX OINT
TOPICAL_OINTMENT | CUTANEOUS | Status: AC
  Filled 2018-08-20: qty 28.35

## 2018-08-20 MED ORDER — PROPOFOL 10 MG/ML IV BOLUS
INTRAVENOUS | Status: AC
  Filled 2018-08-20: qty 20

## 2018-08-20 MED ORDER — ONDANSETRON HCL 4 MG/2ML IJ SOLN
INTRAMUSCULAR | Status: DC | PRN
  Administered 2018-08-20: 4 mg via INTRAVENOUS

## 2018-08-20 MED ORDER — 0.9 % SODIUM CHLORIDE (POUR BTL) OPTIME
TOPICAL | Status: DC | PRN
  Administered 2018-08-20: 1000 mL

## 2018-08-20 MED ORDER — VANCOMYCIN HCL IN DEXTROSE 1-5 GM/200ML-% IV SOLN
INTRAVENOUS | Status: AC
  Filled 2018-08-20: qty 200

## 2018-08-20 MED ORDER — ROCURONIUM BROMIDE 50 MG/5ML IV SOSY
PREFILLED_SYRINGE | INTRAVENOUS | Status: AC
  Filled 2018-08-20: qty 5

## 2018-08-20 MED ORDER — FENTANYL CITRATE (PF) 100 MCG/2ML IJ SOLN
25.0000 ug | INTRAMUSCULAR | Status: DC | PRN
  Administered 2018-08-20 (×2): 50 ug via INTRAVENOUS
  Administered 2018-08-20 (×2): 25 ug via INTRAVENOUS

## 2018-08-20 MED ORDER — THROMBIN 5000 UNITS EX SOLR
CUTANEOUS | Status: AC
  Filled 2018-08-20: qty 5000

## 2018-08-20 MED ORDER — PHENYLEPHRINE 40 MCG/ML (10ML) SYRINGE FOR IV PUSH (FOR BLOOD PRESSURE SUPPORT)
PREFILLED_SYRINGE | INTRAVENOUS | Status: AC
  Filled 2018-08-20: qty 10

## 2018-08-20 SURGICAL SUPPLY — 49 items
BAG DECANTER FOR FLEXI CONT (MISCELLANEOUS) ×2 IMPLANT
BLADE CLIPPER SURG (BLADE) IMPLANT
BLADE SURG 11 STRL SS (BLADE) ×2 IMPLANT
CANISTER SUCT 3000ML PPV (MISCELLANEOUS) ×2 IMPLANT
COVER WAND RF STERILE (DRAPES) ×2 IMPLANT
DECANTER SPIKE VIAL GLASS SM (MISCELLANEOUS) ×2 IMPLANT
DRAPE C-ARM 42X72 X-RAY (DRAPES) IMPLANT
DRAPE LAPAROTOMY 100X72X124 (DRAPES) ×2 IMPLANT
DRAPE MICROSCOPE LEICA (MISCELLANEOUS) IMPLANT
DRAPE SURG 17X23 STRL (DRAPES) ×2 IMPLANT
DRESSING ALLEVYN LIFE SACRUM (GAUZE/BANDAGES/DRESSINGS) ×1 IMPLANT
DURAPREP 26ML APPLICATOR (WOUND CARE) ×2 IMPLANT
ELECT REM PT RETURN 9FT ADLT (ELECTROSURGICAL) ×2
ELECTRODE REM PT RTRN 9FT ADLT (ELECTROSURGICAL) ×1 IMPLANT
GAUZE 4X4 16PLY RFD (DISPOSABLE) IMPLANT
GAUZE SPONGE 4X4 12PLY STRL (GAUZE/BANDAGES/DRESSINGS) IMPLANT
GLOVE BIO SURGEON STRL SZ7.5 (GLOVE) ×2 IMPLANT
GLOVE BIOGEL PI IND STRL 7.5 (GLOVE) ×1 IMPLANT
GLOVE BIOGEL PI INDICATOR 7.5 (GLOVE) ×1
GLOVE EXAM NITRILE LRG STRL (GLOVE) IMPLANT
GLOVE EXAM NITRILE XL STR (GLOVE) IMPLANT
GLOVE EXAM NITRILE XS STR PU (GLOVE) IMPLANT
GOWN STRL REUS W/ TWL LRG LVL3 (GOWN DISPOSABLE) ×2 IMPLANT
GOWN STRL REUS W/ TWL XL LVL3 (GOWN DISPOSABLE) IMPLANT
GOWN STRL REUS W/TWL 2XL LVL3 (GOWN DISPOSABLE) IMPLANT
GOWN STRL REUS W/TWL LRG LVL3 (GOWN DISPOSABLE) ×4
GOWN STRL REUS W/TWL XL LVL3 (GOWN DISPOSABLE)
HEMOSTAT POWDER KIT SURGIFOAM (HEMOSTASIS) ×2 IMPLANT
HEMOSTAT POWDER SURGIFOAM 1G (HEMOSTASIS) ×2 IMPLANT
KIT BASIN OR (CUSTOM PROCEDURE TRAY) ×2 IMPLANT
KIT TURNOVER KIT B (KITS) ×2 IMPLANT
NEEDLE HYPO 22GX1.5 SAFETY (NEEDLE) IMPLANT
NS IRRIG 1000ML POUR BTL (IV SOLUTION) ×2 IMPLANT
PACK LAMINECTOMY NEURO (CUSTOM PROCEDURE TRAY) ×2 IMPLANT
PAD ARMBOARD 7.5X6 YLW CONV (MISCELLANEOUS) ×6 IMPLANT
RUBBERBAND STERILE (MISCELLANEOUS) IMPLANT
SPONGE LAP 4X18 RFD (DISPOSABLE) IMPLANT
SUT ETHILON 3 0 FSL (SUTURE) ×2 IMPLANT
SUT ETHILON 3 0 PS 1 (SUTURE) ×1 IMPLANT
SUT MNCRL AB 3-0 PS2 18 (SUTURE) ×1 IMPLANT
SUT VIC AB 0 CT1 18XCR BRD8 (SUTURE) ×1 IMPLANT
SUT VIC AB 0 CT1 8-18 (SUTURE) ×6
SUT VIC AB 2-0 CP2 18 (SUTURE) ×4 IMPLANT
SUT VIC AB 2-0 CT2 18 VCP726D (SUTURE) ×1 IMPLANT
SWAB COLLECTION DEVICE MRSA (MISCELLANEOUS) IMPLANT
SWAB CULTURE ESWAB REG 1ML (MISCELLANEOUS) IMPLANT
TOWEL GREEN STERILE (TOWEL DISPOSABLE) ×2 IMPLANT
TOWEL GREEN STERILE FF (TOWEL DISPOSABLE) ×2 IMPLANT
WATER STERILE IRR 1000ML POUR (IV SOLUTION) IMPLANT

## 2018-08-20 NOTE — Op Note (Signed)
PATIENT: Katherine Delgado  DAY OF SURGERY: 08/20/18   PRE-OPERATIVE DIAGNOSIS:  Lumbar wound dehiscence   POST-OPERATIVE DIAGNOSIS:  Lumbar wound dehiscence   PROCEDURE:  Lumbar wound revision   SURGEON:  Surgeon(s) and Role:    Judith Part, MD - Primary   ANESTHESIA: ETGA   BRIEF HISTORY: This is a 77 year old woman who previously had a lumbar laminectomy 5 weeks ago. She had poor wound healing in the post-operative period with what was initially a sterile dehiscence that was treated with local wound care. However, the wound began having increased drainage and induration and was still not healing well, so I recommended wound exploration to evaluate for underlying infection as well as wound revision. This was discussed with the patient as well as risks, benefits, and alternatives and wished to proceed with surgery.   OPERATIVE DETAIL: The patient was taken to the operating room and placed on the OR table in the prone position. A formal time out was performed with two patient identifiers and confirmed the operative site. Anesthesia was induced by the anesthesia team. The operative site was prepped and draped in a sterile fashion. The prior lumbar incision was opened and clear seromatous fluid was encountered. There was central yellow granulation tissue, no evidence of active infection. Dissection was performed through the fascia to explore the deep wound, which appeared to be healing well without evidence of active infection. The wound was therefore revised by debriding any non-viable tissue back to bleeding edge, the tissue was undermined to decrease tension, and then copiously irrigated with antibiotic solution. The wound was then closed in layers in the usual fashion. All instrument and sponge counts were correct. The patient was then returned to anesthesia for emergence. No apparent complications at the completion of the procedure.   EBL:  72mL   DRAINS: none   SPECIMENS: none    Judith Part, MD 08/20/18 4:21 PM

## 2018-08-20 NOTE — OR Nursing (Signed)
1920: allevyn sacral pad placed to initiate protocol; sacral skin clear at this time.

## 2018-08-20 NOTE — Anesthesia Preprocedure Evaluation (Addendum)
Anesthesia Evaluation  Patient identified by MRN, date of birth, ID band Patient awake    Reviewed: Allergy & Precautions, NPO status , Patient's Chart, lab work & pertinent test results, reviewed documented beta blocker date and time   History of Anesthesia Complications Negative for: history of anesthetic complications  Airway Mallampati: II  TM Distance: >3 FB Neck ROM: Full    Dental  (+) Dental Advisory Given, Poor Dentition, Chipped   Pulmonary COPD,  COPD inhaler, former smoker,    breath sounds clear to auscultation       Cardiovascular hypertension, Pt. on home beta blockers and Pt. on medications  Rhythm:Regular Rate:Normal     Neuro/Psych  Headaches, PSYCHIATRIC DISORDERS Depression  Neuromuscular disease (hx guillain-barre; polyneuropathy)    GI/Hepatic Neg liver ROS, GERD  Medicated and Controlled,  Endo/Other  diabetes, Type 2, Insulin DependentHypothyroidism Morbid obesity  Renal/GU negative Renal ROS     Musculoskeletal  (+) Arthritis , Rheumatoid disorders,  Fibromyalgia -  Abdominal   Peds  Hematology negative hematology ROS (+)   Anesthesia Other Findings   Reproductive/Obstetrics                            Anesthesia Physical Anesthesia Plan  ASA: III  Anesthesia Plan: General   Post-op Pain Management:    Induction: Intravenous  PONV Risk Score and Plan: 3 and Treatment may vary due to age or medical condition, Ondansetron and Propofol infusion  Airway Management Planned: Oral ETT  Additional Equipment: None  Intra-op Plan:   Post-operative Plan: Extubation in OR  Informed Consent: I have reviewed the patients History and Physical, chart, labs and discussed the procedure including the risks, benefits and alternatives for the proposed anesthesia with the patient or authorized representative who has indicated his/her understanding and acceptance.   Dental  advisory given  Plan Discussed with: CRNA and Anesthesiologist  Anesthesia Plan Comments:        Anesthesia Quick Evaluation

## 2018-08-20 NOTE — Progress Notes (Signed)
Neurosurgery Service Progress Note  Subjective: No acute events overnight, no new complaints this morning   Objective: Vitals:   08/19/18 1613 08/19/18 2009 08/19/18 2313 08/20/18 0408  BP: 137/65 (!) 143/46 (!) 137/55 (!) 133/53  Pulse: 64 74 77 70  Resp: 18 15 16 16   Temp: 98.6 F (37 C) 98.2 F (36.8 C) 98 F (36.7 C) 98 F (36.7 C)  TempSrc: Oral Oral Oral Oral  SpO2: 98% 94% 96% 93%  Weight:      Height:       Temp (24hrs), Avg:98.1 F (36.7 C), Min:97.9 F (36.6 C), Max:98.6 F (37 C)  CBC Latest Ref Rng & Units 08/17/2018 08/09/2018 07/03/2018  WBC 4.0 - 10.5 K/uL 13.3(H) 17.2(H) 14.7(H)  Hemoglobin 12.0 - 15.0 g/dL 12.9 13.9 13.2  Hematocrit 36.0 - 46.0 % 41.8 42.9 44.2  Platelets 150 - 400 K/uL 292 430(H) 348   BMP Latest Ref Rng & Units 08/09/2018 07/03/2018 04/06/2018  Glucose 65 - 139 mg/dL 45(L) 194(H) 102(H)  BUN 7 - 25 mg/dL 19 23 18   Creatinine 0.60 - 0.93 mg/dL 1.00(H) 0.98 0.93  BUN/Creat Ratio 6 - 22 (calc) 19 - NOT APPLICABLE  Sodium 970 - 146 mmol/L 142 137 141  Potassium 3.5 - 5.3 mmol/L 4.1 3.9 4.8  Chloride 98 - 110 mmol/L 104 100 103  CO2 20 - 32 mmol/L 30 29 31   Calcium 8.6 - 10.4 mg/dL 9.8 9.4 9.1   No intake or output data in the 24 hours ending 08/20/18 0717  Current Facility-Administered Medications:  .  acetaminophen (TYLENOL) tablet 650 mg, 650 mg, Oral, Q6H PRN **OR** acetaminophen (TYLENOL) suppository 650 mg, 650 mg, Rectal, Q6H PRN, Meyran, Ocie Cornfield, NP .  alum & mag hydroxide-simeth (MAALOX/MYLANTA) 200-200-20 MG/5ML suspension 30 mL, 30 mL, Oral, Q6H PRN, Judith Part, MD, 30 mL at 08/18/18 1800 .  amLODipine (NORVASC) tablet 10 mg, 10 mg, Oral, Daily, Meyran, Ocie Cornfield, NP, 10 mg at 08/19/18 2637 .  docusate sodium (COLACE) capsule 100 mg, 100 mg, Oral, BID, Meyran, Ocie Cornfield, NP, 100 mg at 08/18/18 2142 .  feeding supplement (ENSURE ENLIVE) (ENSURE ENLIVE) liquid 237 mL, 237 mL, Oral, BID BM, Judith Part, MD, 237 mL at 08/18/18 1742 .  feeding supplement (PRO-STAT SUGAR FREE 64) liquid 30 mL, 30 mL, Oral, BID, Bee Hammerschmidt, Joyice Faster, MD, 30 mL at 08/19/18 2023 .  HYDROcodone-acetaminophen (NORCO/VICODIN) 5-325 MG per tablet 1-2 tablet, 1-2 tablet, Oral, Q4H PRN, Meyran, Ocie Cornfield, NP, 1 tablet at 08/20/18 0559 .  insulin aspart (novoLOG) injection 0-20 Units, 0-20 Units, Subcutaneous, TID WC, Meyran, Ocie Cornfield, NP, 3 Units at 08/19/18 1723 .  levothyroxine (SYNTHROID, LEVOTHROID) tablet 137 mcg, 137 mcg, Oral, Q0600, Meyran, Ocie Cornfield, NP, 137 mcg at 08/20/18 0422 .  lisinopril (PRINIVIL,ZESTRIL) tablet 20 mg, 20 mg, Oral, Daily, Meyran, Ocie Cornfield, NP, 20 mg at 08/19/18 0839 .  metoprolol tartrate (LOPRESSOR) tablet 50 mg, 50 mg, Oral, BID, Meyran, Ocie Cornfield, NP, 50 mg at 08/19/18 2023 .  mupirocin ointment (BACTROBAN) 2 % 1 application, 1 application, Nasal, BID, Kynsleigh Westendorf, Joyice Faster, MD, 1 application at 85/88/50 2025 .  ondansetron (ZOFRAN) tablet 4 mg, 4 mg, Oral, Q6H PRN **OR** ondansetron (ZOFRAN) injection 4 mg, 4 mg, Intravenous, Q6H PRN, Meyran, Ocie Cornfield, NP .  pregabalin (LYRICA) capsule 150 mg, 150 mg, Oral, BID, Meyran, Ocie Cornfield, NP, 150 mg at 08/19/18 2024 .  triamterene-hydrochlorothiazide (MAXZIDE-25) 37.5-25 MG per tablet 1 tablet, 1 tablet,  Oral, Daily, Meyran, Ocie Cornfield, NP, 1 tablet at 08/19/18 [phone] .  venlafaxine XR (EFFEXOR-XR) 24 hr capsule 75 mg, 75 mg, Oral, Q breakfast, Meyran, Ocie Cornfield, NP, 75 mg at 08/19/18 9941   Physical Exam: AOx3, PERRL, EOMI, FS, Strength 5/5 x4, SILTx4 Incision with dehiscence, mildly malodorous with some yellow discharge and some induration  Assessment & Plan: 77 y.o. woman s/p lumbar laminectomies 5 weeks ago with poor wound healing post-operatively, now presenting with wound dehiscence and concern for possible underlying infection. -continue NPO status -OR today for wound revision  and washout  Judith Part  08/20/18 7:17 AM

## 2018-08-20 NOTE — Anesthesia Postprocedure Evaluation (Signed)
Anesthesia Post Note  Patient: Katherine Delgado  Procedure(s) Performed: POSTERIOR LUMBAR SPINAL WOUND DEBRIDEMENT AND REVISION (N/A Spine Lumbar)     Patient location during evaluation: PACU Anesthesia Type: General Level of consciousness: awake and alert Pain management: pain level controlled Vital Signs Assessment: post-procedure vital signs reviewed and stable Respiratory status: spontaneous breathing, nonlabored ventilation, respiratory function stable and patient connected to nasal cannula oxygen Cardiovascular status: blood pressure returned to baseline and stable Postop Assessment: no apparent nausea or vomiting Anesthetic complications: no    Last Vitals:  Vitals:   08/20/18 2050 08/20/18 2100  BP:  122/63  Pulse: 87 85  Resp: 18 13  Temp:    SpO2: 99% 100%    Last Pain:  Vitals:   08/20/18 2050  TempSrc:   PainSc: 7                  Nanci Lakatos

## 2018-08-20 NOTE — Progress Notes (Signed)
RN verified the presence of a signed informed consent that matches stated procedure by patient. Verified armband matches patient's stated name and birth date. Verified NPO status and that all jewelry, contact, glasses, dentures, and partials had been removed (if applicable).  

## 2018-08-20 NOTE — Brief Op Note (Signed)
08/20/2018  7:11 PM  PATIENT:  [name]  77 y.o. female  PRE-OPERATIVE DIAGNOSIS:  Lumbar superficial wound dehiscence  POST-OPERATIVE DIAGNOSIS:  Lumbar superficial wound dehiscence  PROCEDURE:  Procedure(s) with comments: POSTERIOR LUMBAR SPINAL WOUND DEBRIDEMENT AND REVISION (N/A) - POSTERIOR LUMBAR SPINAL WOUND REVISION  SURGEON:  Surgeon(s) and Role:    * Aniya Jolicoeur, Joyice Faster, MD - Primary  PHYSICIAN ASSISTANT:   ASSISTANTS: none   ANESTHESIA:   general  EBL:  5 mL   BLOOD ADMINISTERED:none  DRAINS: none   LOCAL MEDICATIONS USED:  NONE  SPECIMEN:  No Specimen  DISPOSITION OF SPECIMEN:  N/A  COUNTS:  YES  TOURNIQUET:  * No tourniquets in log *  DICTATION: .Note written in EPIC  PLAN OF CARE: Admit to inpatient   PATIENT DISPOSITION:  PACU - hemodynamically stable.   Delay start of Pharmacological VTE agent (>24hrs) due to surgical blood loss or risk of bleeding: yes

## 2018-08-20 NOTE — Care Management Important Message (Signed)
Important Message  Patient Details  Name: Katherine Delgado MRN: [id number] Date of Birth: 1941-07-30   Medicare Important Message Given:  Yes    Annis Lagoy 08/20/2018, 4:02 PM

## 2018-08-20 NOTE — Transfer of Care (Signed)
Immediate Anesthesia Transfer of Care Note  Patient: Katherine Delgado [facility]  Procedure(s) Performed: POSTERIOR LUMBAR SPINAL WOUND DEBRIDEMENT AND REVISION (N/A Spine Lumbar)  Patient Location: PACU  Anesthesia Type:General  Level of Consciousness: awake and alert   Airway & Oxygen Therapy: Patient Spontanous Breathing and Patient connected to nasal cannula oxygen  Post-op Assessment: Report given to RN and Post -op Vital signs reviewed and stable  Post vital signs: Reviewed and stable  Last Vitals:  Vitals Value Taken Time  BP 197/163 08/20/2018  7:16 PM  Temp    Pulse 105 08/20/2018  7:24 PM  Resp 21 08/20/2018  7:24 PM  SpO2 96 % 08/20/2018  7:24 PM  Vitals shown include unvalidated device data.  Last Pain:  Vitals:   08/20/18 1122  TempSrc: Oral  PainSc:       Patients Stated Pain Goal: 0 (60/73/71 0626)  Complications: No apparent anesthesia complications

## 2018-08-20 NOTE — Anesthesia Procedure Notes (Signed)

## 2018-08-21 ENCOUNTER — Encounter (HOSPITAL_COMMUNITY): Payer: Self-pay | Admitting: Neurological Surgery

## 2018-08-21 LAB — HEMOGLOBIN A1C
Hgb A1c MFr Bld: 6.6 % — ABNORMAL HIGH (ref 4.8–5.6)
Mean Plasma Glucose: 142.72 mg/dL

## 2018-08-21 LAB — BASIC METABOLIC PANEL WITH GFR
Anion gap: 8 (ref 5–15)
BUN: 26 mg/dL — ABNORMAL HIGH (ref 8–23)
CO2: 28 mmol/L (ref 22–32)
Calcium: 8.8 mg/dL — ABNORMAL LOW (ref 8.9–10.3)
Chloride: 98 mmol/L (ref 98–111)
Creatinine, Ser: 1.11 mg/dL — ABNORMAL HIGH (ref 0.44–1.00)
GFR calc Af Amer: 55 mL/min — ABNORMAL LOW
GFR calc non Af Amer: 48 mL/min — ABNORMAL LOW
Glucose, Bld: 446 mg/dL — ABNORMAL HIGH (ref 70–99)
Potassium: 5 mmol/L (ref 3.5–5.1)
Sodium: 134 mmol/L — ABNORMAL LOW (ref 135–145)

## 2018-08-21 LAB — GLUCOSE, CAPILLARY
Glucose-Capillary: 223 mg/dL — ABNORMAL HIGH (ref 70–99)
Glucose-Capillary: 416 mg/dL — ABNORMAL HIGH (ref 70–99)
Glucose-Capillary: 319 mg/dL — ABNORMAL HIGH (ref 70–99)
Glucose-Capillary: 335 mg/dL — ABNORMAL HIGH (ref 70–99)

## 2018-08-21 MED ORDER — HYDROMORPHONE HCL 1 MG/ML IJ SOLN
0.5000 mg | INTRAMUSCULAR | Status: DC | PRN
  Administered 2018-08-21 – 2018-08-22 (×4): 0.5 mg via INTRAVENOUS
  Filled 2018-08-21 (×3): qty 0.5

## 2018-08-21 MED ORDER — OXYCODONE HCL 5 MG PO TABS: 5.0000 mg | ORAL_TABLET | ORAL | 0 refills | Status: DC | PRN

## 2018-08-21 MED ORDER — SODIUM CHLORIDE 0.9 % IV SOLN
INTRAVENOUS | Status: DC
  Administered 2018-08-21: 18:00:00 via INTRAVENOUS

## 2018-08-21 MED ORDER — INSULIN GLARGINE 100 UNIT/ML ~~LOC~~ SOLN
10.0000 [IU] | Freq: Every day | SUBCUTANEOUS | Status: DC
  Administered 2018-08-21 – 2018-08-22 (×2): 10 [IU] via SUBCUTANEOUS
  Filled 2018-08-21 (×2): qty 0.1

## 2018-08-21 NOTE — Progress Notes (Signed)
Inpatient Diabetes Program Recommendations  AACE/ADA: New Consensus Statement on Inpatient Glycemic Control (2015)  Target Ranges:  Prepandial:   less than 140 mg/dL      Peak postprandial:   less than 180 mg/dL (1-2 hours)      Critically ill patients:  140 - 180 mg/dL   Lab Results  Component Value Date   GLUCAP 223 (H) 08/21/2018   HGBA1C 7.4 (H) 08/09/2018    Review of Glycemic Control Results for [name], Katherine Delgado (MRN [id number]) as of 08/21/2018 11:03  Ref. Range 08/20/2018 19:14 08/20/2018 21:15 08/20/2018 22:23 08/21/2018 06:04  Glucose-Capillary Latest Ref Range: 70 - 99 mg/dL 276 (H) 249 (H) 208 (H) 223 (H)   Type 2 DM.  Home: Toujeo 100 units QHS, Victoza 1.8 mg QHS, Humalog 20 units TID.  Current: Novolog 0-20 units TID.   Noted orders for potential discharge. If to remain inpatient consider restarting a portion of patient's basal: Lantus 16 units QHS.  Thanks, Bronson Curb, MSN, RNC-OB Diabetes Coordinator [phone] (8a-5p)

## 2018-08-21 NOTE — Care Management Note (Signed)
Case Management Note  Patient Details  Name: Katherine Delgado MRN: [id number] Date of Birth: 23-May-1941  Subjective/Objective:    Pt admitted and is s/p I&D for wound dehiscence.  She is from home with her son.     Pt active with [facility].             Action/Plan: Pt discharging home with resumption orders for [facility] services. CM called Cassie with [facility] and informed her of d/c.  Son to provide transport home.   Expected Discharge Date:  08/21/18               Expected Discharge Plan:  [place]  In-House Referral:     Discharge planning Services  CM Consult  Post Acute Care Choice:  Home Health, Resumption of Svcs/PTA Provider Choice offered to:  Patient  DME Arranged:    DME Agency:     HH Arranged:  Nurse's Aide, PT [place] Agency:  [facility]  Status of Service:  Completed, signed off  If discussed at [place] of Stay Meetings, dates discussed:    Additional Comments:  Pollie Friar, RN 08/21/2018, 11:18 AM

## 2018-08-21 NOTE — Progress Notes (Signed)
Neurosurgery progress note  BG elevated to 400, given 20U with persistent in the low 300s. RFP with hyponatremia, likely pseudohyponatremia / osmotic. Fever likely post-op inflammation, already improving. A1C 6.6, creatinine mildly increased  -continue high dose ISS -start glargine 10U -start NS @ 100 -AM RFP to follow renal function

## 2018-08-21 NOTE — Progress Notes (Signed)
MD notified about pt. Temp (102.8) and BP (135/108). Tylenol was given at as well as incentive spirometer provided. Temp rechecked at 1603 (100.0).  Nurse will continue to monitor.

## 2018-08-21 NOTE — Discharge Instructions (Signed)
Discharge Instructions  No restriction in activities, slowly increase your activity back to normal.   Okay to shower on the day of discharge. Use regular soap and water and try to be gentle when cleaning your incision. You do not need to keep a dressing on your incision unless you are concerned that it is becoming soiled.   Follow up with Dr. Zada Finders in 2 weeks after discharge. If you do not already have a discharge appointment, please call his office at [phone] to schedule a follow up appointment. If you have any concerns or questions, please call the office and let us know.

## 2018-08-21 NOTE — Progress Notes (Signed)
Neurosurgery Service Progress Note  Subjective: No acute events overnight, no new complaints this morning, some mild back soreness    Objective: Vitals:   08/20/18 2158 08/20/18 2342 08/21/18 0339 08/21/18 0755  BP: (!) 152/64 (!) 141/60 (!) 159/97 (!) 163/77  Pulse: 91 81 73 87  Resp:  18  16  Temp: 98.6 F (37 C) 97.8 F (36.6 C) 98.8 F (37.1 C) 98.4 F (36.9 C)  TempSrc: Oral Oral Oral Oral  SpO2: 94% 96% 99% 97%  Weight:      Height:       Temp (24hrs), Avg:98.3 F (36.8 C), Min:97.8 F (36.6 C), Max:98.8 F (37.1 C)  CBC Latest Ref Rng & Units 08/17/2018 08/09/2018 07/03/2018  WBC 4.0 - 10.5 K/uL 13.3(H) 17.2(H) 14.7(H)  Hemoglobin 12.0 - 15.0 g/dL 12.9 13.9 13.2  Hematocrit 36.0 - 46.0 % 41.8 42.9 44.2  Platelets 150 - 400 K/uL 292 430(H) 348   BMP Latest Ref Rng & Units 08/09/2018 07/03/2018 04/06/2018  Glucose 65 - 139 mg/dL 45(L) 194(H) 102(H)  BUN 7 - 25 mg/dL 19 23 18   Creatinine 0.60 - 0.93 mg/dL 1.00(H) 0.98 0.93  BUN/Creat Ratio 6 - 22 (calc) 19 - NOT APPLICABLE  Sodium 176 - 146 mmol/L 142 137 141  Potassium 3.5 - 5.3 mmol/L 4.1 3.9 4.8  Chloride 98 - 110 mmol/L 104 100 103  CO2 20 - 32 mmol/L 30 29 31   Calcium 8.6 - 10.4 mg/dL 9.8 9.4 9.1    Intake/Output Summary (Last 24 hours) at 08/21/2018 1607 Last data filed at 08/20/2018 1808 Gross per 24 hour  Intake -  Output 5 ml  Net -5 ml    Current Facility-Administered Medications:  .  acetaminophen (TYLENOL) tablet 650 mg, 650 mg, Oral, Q6H PRN **OR** acetaminophen (TYLENOL) suppository 650 mg, 650 mg, Rectal, Q6H PRN, Judith Part, MD, 650 mg at 08/20/18 1228 .  alum & mag hydroxide-simeth (MAALOX/MYLANTA) 200-200-20 MG/5ML suspension 30 mL, 30 mL, Oral, Q6H PRN, Judith Part, MD, 30 mL at 08/18/18 1800 .  amLODipine (NORVASC) tablet 10 mg, 10 mg, Oral, Daily, Judith Part, MD, 10 mg at 08/19/18 3710 .  docusate sodium (COLACE) capsule 100 mg, 100 mg, Oral, BID, Judith Part, MD, 100 mg at 08/18/18 2142 .  feeding supplement (ENSURE ENLIVE) (ENSURE ENLIVE) liquid 237 mL, 237 mL, Oral, BID BM, Judith Part, MD, 237 mL at 08/18/18 1742 .  feeding supplement (PRO-STAT SUGAR FREE 64) liquid 30 mL, 30 mL, Oral, BID, Emeril Stille, Joyice Faster, MD, 30 mL at 08/20/18 2225 .  HYDROcodone-acetaminophen (NORCO/VICODIN) 5-325 MG per tablet 1-2 tablet, 1-2 tablet, Oral, Q4H PRN, Judith Part, MD, 1 tablet at 08/21/18 [phone] .  HYDROmorphone (DILAUDID) 1 MG/ML injection, , , ,  .  HYDROmorphone (DILAUDID) 1 MG/ML injection, , , ,  .  HYDROmorphone (DILAUDID) injection 0.5 mg, 0.5 mg, Intravenous, Once, Fidelis Loth A, MD .  HYDROmorphone (DILAUDID) injection 0.5 mg, 0.5 mg, Intravenous, Q3H PRN, Shayaan Parke A, MD .  insulin aspart (novoLOG) injection 0-20 Units, 0-20 Units, Subcutaneous, TID WC, Judith Part, MD, 7 Units at 08/21/18 [phone]  lactated ringers infusion, , Intravenous, Continuous, Aasha Dina, Joyice Faster, MD, Last Rate: 10 mL/hr at 08/20/18 1525 .  levothyroxine (SYNTHROID, LEVOTHROID) tablet 137 mcg, 137 mcg, Oral, Q0600, Judith Part, MD, 137 mcg at 08/21/18 0609 .  lisinopril (PRINIVIL,ZESTRIL) tablet 20 mg, 20 mg, Oral, Daily, Xachary Hambly, Joyice Faster, MD, 20 mg at 08/19/18  3419 .  metoprolol tartrate (LOPRESSOR) tablet 50 mg, 50 mg, Oral, BID, Judith Part, MD, 50 mg at 08/20/18 2223 .  mupirocin ointment (BACTROBAN) 2 % 1 application, 1 application, Nasal, BID, Judith Part, MD, 1 application at 37/90/24 2229 .  ondansetron (ZOFRAN) tablet 4 mg, 4 mg, Oral, Q6H PRN **OR** ondansetron (ZOFRAN) injection 4 mg, 4 mg, Intravenous, Q6H PRN, Siyana Erney A, MD .  oxyCODONE (Oxy IR/ROXICODONE) 5 MG immediate release tablet, , , ,  .  pregabalin (LYRICA) capsule 150 mg, 150 mg, Oral, BID, Judith Part, MD, 150 mg at 08/20/18 2223 .  triamterene-hydrochlorothiazide (MAXZIDE-25) 37.5-25 MG per tablet 1 tablet, 1  tablet, Oral, Daily, Judith Part, MD, 1 tablet at 08/19/18 0839 .  venlafaxine XR (EFFEXOR-XR) 24 hr capsule 75 mg, 75 mg, Oral, Q breakfast, Judith Part, MD, 75 mg at 08/19/18 0973   Physical Exam: AOx3, PERRL, EOMI, FS, Strength 5/5 x4, SILTx4 Incision w/ dressing c/d/i  Assessment & Plan: 77 y.o. woman s/p prior lumbar laminectomies now s/p wound revision, recovering well. -no purulence in OR, no need for continued antibiotics -tolerating oral diet and oral pain medication, okay for discharge if she feels well this afternoon -will need home health care arranged / restarted -SCDs/TEDs  Judith Part  08/21/18 8:11 AM

## 2018-08-21 NOTE — Consult Note (Addendum)
   [facility] CM Inpatient Consult   08/21/2018  Isella Slatten [facility] [birth year] [phone]    Patient screened for potential [facility] Care Management program.  Martin Majestic to bedside to speak with Mrs. Stetzel about [facility] Care Management program services. She pleasantly declines. Denies having transportation, medication, or disease management needs. Reports she will have [facility] home health for support.   Provided [facility] Care Management brochure with contact information to call should she change her mind.  Made inpatient RNCM aware [place] Management services were declined.    Marthenia Rolling, MSN-Ed, RN,BSN [facility] Liaison [phone]

## 2018-08-21 NOTE — Discharge Summary (Signed)
Discharge Summary  Date of Admission: 08/17/2018  Date of Discharge: 08/21/18  Attending Physician: Emelda Brothers, MD  Hospital Course: Patient was admitted due to wound dehiscence from a prior lumbar laminectomy. She was taken to the OR on 08/20/18 for a wound revision which showed a sterile superficial dehiscence without any evidence of purulence. She was recovered in PACU and transferred to a regular nursing floor. Her hospital course was uncomplicated and the patient was discharged home with home health care on 08/21/18. She will follow up in clinic with me in 2 weeks.  Neurologic exam at discharge:  AOx3, PERRL, EOMI, FS, TM Strength 5/5 x4, SILTx4  Judith Part, MD 08/21/18 8:28 AM

## 2018-08-21 NOTE — Progress Notes (Signed)
Tylenol was given at 1544.

## 2018-08-22 ENCOUNTER — Encounter (HOSPITAL_COMMUNITY): Payer: Self-pay | Admitting: Internal Medicine

## 2018-08-22 DIAGNOSIS — T8130XA Disruption of wound, unspecified, initial encounter: Secondary | ICD-10-CM

## 2018-08-22 LAB — RENAL FUNCTION PANEL
ALBUMIN: 2.6 g/dL — AB (ref 3.5–5.0)
Anion gap: 8 (ref 5–15)
BUN: 22 mg/dL (ref 8–23)
CHLORIDE: 99 mmol/L (ref 98–111)
CO2: 28 mmol/L (ref 22–32)
Calcium: 8.5 mg/dL — ABNORMAL LOW (ref 8.9–10.3)
Creatinine, Ser: 0.93 mg/dL (ref 0.44–1.00)
GFR calc Af Amer: >60 mL/min (ref >60)
GFR calc non Af Amer: 59 mL/min — ABNORMAL LOW (ref >60)
Glucose, Bld: 270 mg/dL — ABNORMAL HIGH (ref 70–99)
PHOSPHORUS: 2.5 mg/dL (ref 2.5–4.6)
POTASSIUM: 4 mmol/L (ref 3.5–5.1)
Sodium: 135 mmol/L (ref 135–145)

## 2018-08-22 LAB — GLUCOSE, CAPILLARY
GLUCOSE-CAPILLARY: 300 mg/dL — AB (ref 70–99)
GLUCOSE-CAPILLARY: 306 mg/dL — AB (ref 70–99)
GLUCOSE-CAPILLARY: 406 mg/dL — AB (ref 70–99)
Glucose-Capillary: 288 mg/dL — ABNORMAL HIGH (ref 70–99)
Glucose-Capillary: 297 mg/dL — ABNORMAL HIGH (ref 70–99)

## 2018-08-22 MED ORDER — INSULIN GLARGINE 100 UNIT/ML ~~LOC~~ SOLN
100.0000 [IU] | Freq: Every day | SUBCUTANEOUS | Status: DC
  Administered 2018-08-22 – 2018-08-23 (×2): 100 [IU] via SUBCUTANEOUS
  Filled 2018-08-22 (×3): qty 1

## 2018-08-22 NOTE — Progress Notes (Signed)
Notified Dr. Zada Finders of increased serous drainage from surgical site. No further orders received. Arlis Porta

## 2018-08-22 NOTE — Progress Notes (Signed)
   Providing Compassionate, Quality Care - Together   Subjective: Patient reports no issues overnight. Incision is extremely tender. Patient is sitting on edge of bed eating breakfast on assessment.  Objective: Vital signs in last 24 hours: Temp:  [98.3 F (36.8 C)-102.8 F (39.3 C)] 98.7 F (37.1 C) (01/01 0852) Pulse Rate:  [76-104] 84 (01/01 0852) Resp:  [16-20] 20 (01/01 0852) BP: (115-172)/(44-108) 152/48 (01/01 0852) SpO2:  [92 %-98 %] 93 % (01/01 0852)  Intake/Output from previous day: 12/31 0701 - 01/01 0700 In: 800 [I.V.:800] Out: 3050 [Urine:3050] Intake/Output this shift: Total I/O In: -  Out: 500 [Urine:500]  Alert and oriented x 3 MAE, Strength BUE 5/5, BLE 5/5 PERRLA Incision reddened with serous drainage.  Lab Results: No results for input(s): WBC, HGB, HCT, PLT in the last 72 hours. BMET Recent Labs    08/21/18 1208 08/22/18 0447  NA 134* 135  K 5.0 4.0  CL 98 99  CO2 28 28  GLUCOSE 446* 270*  BUN 26* 22  CREATININE 1.11* 0.93  CALCIUM 8.8* 8.5*    Studies/Results: No results found.  Assessment/Plan: Lumbar laminectomy approximately six weeks ago. Developed redness and drainage from incision. Lumbar wound revision 08/20/2018.   LOS: 5 days    -Order placed for nursing staff to change dressing BID and PRN to keep incision dry -Continue monitoring blood glucose, consult placed to hospitalist service for assistance   Viona Gilmore, DNP, AGNP-C Nurse Practitioner  [facility] [place]. [address], [place], [place], [place] [address] P: [phone]    F: [phone]  08/22/2018, 10:18 AM

## 2018-08-22 NOTE — Consult Note (Signed)
Medical Consultation   Katherine Delgado  WUX:[id number]  DOB: 08/24/1940  DOA: 08/17/2018  PCP: Gayland Curry, DO   Outpatient Specialists:  Jannifer Franklin - neurology; [facility] - neurosurgery  Requesting physician: [facility]  Reason for consultation: Wound dehiscence after laminectomy, went to OR for wound revision.  Her glucose is not well controlled, 200-300.  Dr. Zada Finders requests assistance with diabetes control to improve wound healing.   History of Present Illness: LESTINE Delgado is an 78 y.o. female with h/o hypothyroidism; HTN; DM; RA; GERD; COPD; morbid obesity (BMI 41); GBS; HLD; depression; and chronic pain who was admitted on 12/27 for wound dehiscence from laminectomy.   She was previously admitted on 11/12 for lumbar laminectomy and discharged to SNF on 11/19.  She isn't sure why her sugars are high - they have been high since she came in.  They are never this high at home.  She is on a soft diet but is not currently on carb modified diet.  She has been trying not to eat certain things like a cookie that has shown up on her tray.  At home, she has been on sliding scale depending on her sugars.  At home, she has elevation if she has dietary indiscretion but they usually run 116-120.  At home, she uses AC SSI, up to 20 units with meals.  She uses Toujeo at night, 100 units.  She has not been getting that in the hospital.   Review of Systems:  Review of Systems  Neurological: Positive for focal weakness.   As per HPI otherwise 10 point review of systems negative.    Past Medical History: Past Medical History:  Diagnosis Date  . Abnormal weight gain   . Abnormality of gait 04/19/2016  . Acute infective polyneuritis ([place]) 2002  . Allergic rhinitis due to pollen   . Chronic pain syndrome   . COPD (chronic obstructive pulmonary disease) (HCC)    chronic bronchitis  . Depressive disorder, not elsewhere classified   . Diaphragmatic hernia without mention of  obstruction or gangrene   . Dyslipidemia   . Fibromyalgia   . GERD (gastroesophageal reflux disease)    with h/o esophagitis  . Guillain-Barre syndrome ([place])   . History of benign thymus tumor   . Insomnia, unspecified   . Lumbar spinal stenosis 03/30/2018   L4-5 level, severe  . Miscarriage 1962  . Mixed hyperlipidemia   . Morbid obesity ([place])   . Osteoporosis   . Pneumonia   . Polyneuropathy in diabetes(357.2)   . Rheumatoid arthritis(714.0)   . Spondylosis, lumbosacral   . Spontaneous ecchymoses   . Tear film insufficiency, unspecified   . Type II or unspecified type diabetes mellitus with peripheral circulatory disorders, uncontrolled(250.72)   . Unspecified chronic bronchitis ([place])   . Unspecified essential hypertension   . Unspecified hypothyroidism   . Unspecified pruritic disorder   . Unspecified urinary incontinence     Past Surgical History: Past Surgical History:  Procedure Laterality Date  . ABDOMINAL HYSTERECTOMY  1974  . abdominal tumor  2002  . APPENDECTOMY    . [place]  . BRONCHOSCOPY  2001  . CHOLECYSTECTOMY  1984  . KNEE SURGERY Bilateral 08/27/2010 (L) and 01/11/2011 (R)  . LUMBAR LAMINECTOMY/DECOMPRESSION MICRODISCECTOMY N/A 07/03/2018   Procedure: Lumbar Three to Lumbar Five Laminectomy;  Surgeon: Judith Part, MD;  Location: [facility];  Service: Neurosurgery;  Laterality: N/A;  . LUMBAR WOUND DEBRIDEMENT N/A 08/20/2018   Procedure: POSTERIOR LUMBAR SPINAL WOUND DEBRIDEMENT AND REVISION;  Surgeon: Judith Part, MD;  Location: [facility];  Service: Neurosurgery;  Laterality: N/A;  POSTERIOR LUMBAR SPINAL WOUND REVISION  . [place]  . OVARIAN CYST SURGERY  1968  . thymus tumor    . 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 TONSILLECTOMY       Allergies:   Allergies  Allergen Reactions  . Influenza Vaccines Other (See Comments)    "h/o Guillain Barre; dr's told me years ago never to take another flu shot as it could relapse Rosalee Kaufman;  has to do with when vaccine being changed to H1N1 virus"  . Penicillins Hives, Itching, Swelling and Rash    Has patient had a PCN reaction causing immediate rash, facial/tongue/throat swelling, SOB or lightheadedness with hypotension:Unknown Has patient had a PCN reaction causing severe rash involving mucus membranes or skin necrosis: Unknown Has patient had a PCN reaction that required hospitalization:Unknown Has patient had a PCN reaction occurring within the last 10 years: Unknown If all of the above answers are "NO", then may proceed with Cephalosporin use.   . Statins Other (See Comments)    Myopathy, transaminitis  . Sulfamethoxazole-Trimethoprim Itching     Social History:  reports that she quit smoking about 38 years ago. Her smoking use included cigarettes. She has never used smokeless tobacco. She reports that she does not drink alcohol or use drugs.   Family History: Family History  Problem Relation Age of Onset  . Alzheimer's disease Mother   . Heart disease Mother   . Heart disease Father   . Liver disease Father   . Cancer Brother   . Arthritis Son   . Colon polyps Brother   . Colon cancer Neg Hx   . Esophageal cancer Neg Hx   . Kidney disease Neg Hx   . Stomach cancer Neg Hx   . Rectal cancer Neg Hx       Physical Exam: Vitals:   08/22/18 0022 08/22/18 0404 08/22/18 0852 08/22/18 1116  BP: (!) 149/56 (!) 145/44 (!) 152/48 (!) 147/59  Pulse: (!) 104 79 84 89  Resp: 20 18 20 20   Temp: 98.3 F (36.8 C) 99.1 F (37.3 C) 98.7 F (37.1 C) 98.3 F (36.8 C)  TempSrc: Oral Oral Oral Oral  SpO2: 98% 94% 93% 92%  Weight:      Height:        Constitutional: Alert and awake, oriented x3, not in any acute distress. Eyes:  EOMI, irises appear normal, anicteric sclera,  ENMT: external ears and nose appear normal, normal hearing, Lips appear normal, oropharynx mucosa, tongue, posterior pharynx appear normal  Neck: neck appears normal, no masses, normal ROM, no  thyromegaly, no JVD  CVS: S1-S2 clear, no murmur rubs or gallops, no LE edema, normal pedal pulses  Respiratory:  clear to auscultation bilaterally, no wheezing, rales or rhonchi. Respiratory effort normal. No accessory muscle use.  Abdomen: soft nontender, nondistended Musculoskeletal: : no cyanosis, clubbing or edema noted bilaterally Neuro: Cranial nerves II-XII intact, strength, sensation, reflexes Psych: judgement and insight appear normal, stable mood and affect, mental status Skin: Dressing attached to her lumbar spine; the incision most distally continues to drain purulent discharge with mild surrounding erythema    Data reviewed:  I have personally reviewed the recent labs and imaging studies  Pertinent Labs:   Glucose 270 CO2 28   Inpatient Medications:  Scheduled Meds: . amLODipine  10 mg Oral Daily  . docusate sodium  100 mg Oral BID  . feeding supplement (ENSURE ENLIVE)  237 mL Oral BID BM  . feeding supplement (PRO-STAT SUGAR FREE 64)  30 mL Oral BID  .  HYDROmorphone (DILAUDID) injection  0.5 mg Intravenous Once  . insulin aspart  0-20 Units Subcutaneous TID WC  . insulin glargine  100 Units Subcutaneous QHS  . levothyroxine  137 mcg Oral Q0600  . lisinopril  20 mg Oral Daily  . metoprolol tartrate  50 mg Oral BID  . mupirocin ointment  1 application Nasal BID  . pregabalin  150 mg Oral BID  . triamterene-hydrochlorothiazide  1 tablet Oral Daily  . venlafaxine XR  75 mg Oral Q breakfast   Continuous Infusions: . sodium chloride 100 mL/hr at 08/21/18 1813  . lactated ringers 10 mL/hr at 08/20/18 1525     Radiological Exams on Admission: No results found.  Impression/Recommendations Principal Problem:   Wound dehiscence Active Problems:   Essential hypertension   Chronic low back pain   Type 2 diabetes mellitus with diabetic polyneuropathy, with long-term current use of insulin (HCC)   Hypothyroidism   Wound dehiscence -Patient is being followed  for this issue by neurosurgery -Needs better glycemic control for wound healing -She will able to go home once medically stable  Uncontrolled DM -She has reasonably good control at home, as evidenced by her A1c 6.6-7.3 in the last 6 weeks -Patient was not restarted on her home Toujeo, which is 100 units qhs -She was given 10 units Lantus last evening -Additionally, her diet is for a soft diet but is not carb-modified -As a result, her DM has not been well controlled -Resume Toujeo 100, first dose now -Continue to hold Victoza -Add carbohydrate modifications to diet -Anticipate improvement in the next 24-48 hours  HTN -Continue Norvasc, Lopressor, Lisinopril, and Maxzide  Hypothyroidism -Normal TSH 12/19 -Continue Synthroid at current dose for now  Chronic pain -Patient takes Norco and Oxy IR at home -I have reviewed this patient in the [place] Controlled Substances Reporting System.  She is receiving medications from multiple providers but appears to be taking them as prescribed. -She is at high risk of opioid misuse, diversion, or overdose. -Currently receiving Dilaudid and PO Vicodin; would suggest limiting parenteral opioids as much as possible  Thank you for this consultation.  Our [facility] hospitalist team will follow the patient with you.   Time Spent: 50 minutes  Karmen Bongo M.D. [facility] 08/22/2018, 1:21 PM

## 2018-08-22 NOTE — Progress Notes (Signed)
Notified Dr. Zada Finders of HS CBG of 335, order received to use [facility] [place] to give insulin. Arlis Porta

## 2018-08-22 NOTE — Progress Notes (Signed)
Neurosurgery Service Progress Note  Subjective: Had increased hyperglycemia overnight up to 416, started glargine with improvement down to 300  Objective: Vitals:   08/21/18 1629 08/21/18 2039 08/21/18 2134 08/22/18 0022  BP: (!) 155/49 (!) 115/55 (!) 146/57 (!) 149/56  Pulse:  92 93 (!) 104  Resp:  17  20  Temp:  99 F (37.2 C)  98.3 F (36.8 C)  TempSrc:  Oral  Oral  SpO2:  92%  98%  Weight:      Height:       Temp (24hrs), Avg:99.5 F (37.5 C), Min:98.3 F (36.8 C), Max:102.8 F (39.3 C)  CBC Latest Ref Rng & Units 08/17/2018 08/09/2018 07/03/2018  WBC 4.0 - 10.5 K/uL 13.3(H) 17.2(H) 14.7(H)  Hemoglobin 12.0 - 15.0 g/dL 12.9 13.9 13.2  Hematocrit 36.0 - 46.0 % 41.8 42.9 44.2  Platelets 150 - 400 K/uL 292 430(H) 348   BMP Latest Ref Rng & Units 08/21/2018 08/09/2018 07/03/2018  Glucose 70 - 99 mg/dL 446(H) 45(L) 194(H)  BUN 8 - 23 mg/dL 26(H) 19 23  Creatinine 0.44 - 1.00 mg/dL 1.11(H) 1.00(H) 0.98  BUN/Creat Ratio 6 - 22 (calc) - 19 -  Sodium 135 - 145 mmol/L 134(L) 142 137  Potassium 3.5 - 5.1 mmol/L 5.0 4.1 3.9  Chloride 98 - 111 mmol/L 98 104 100  CO2 22 - 32 mmol/L 28 30 29   Calcium 8.9 - 10.3 mg/dL 8.8(L) 9.8 9.4    Intake/Output Summary (Last 24 hours) at 08/22/2018 0157 Last data filed at 08/21/2018 2100 Gross per 24 hour  Intake -  Output 2100 ml  Net -2100 ml    Current Facility-Administered Medications:  .  0.9 %  sodium chloride infusion, , Intravenous, Continuous, , Joyice Faster, MD, Last Rate: 100 mL/hr at 08/21/18 1813 .  acetaminophen (TYLENOL) tablet 650 mg, 650 mg, Oral, Q6H PRN, 650 mg at 08/21/18 1544 **OR** acetaminophen (TYLENOL) suppository 650 mg, 650 mg, Rectal, Q6H PRN, Judith Part, MD, 650 mg at 08/20/18 1228 .  alum & mag hydroxide-simeth (MAALOX/MYLANTA) 200-200-20 MG/5ML suspension 30 mL, 30 mL, Oral, Q6H PRN, Judith Part, MD, 30 mL at 08/18/18 1800 .  amLODipine (NORVASC) tablet 10 mg, 10 mg, Oral, Daily,  ,  A, MD, 10 mg at 08/21/18 1001 .  docusate sodium (COLACE) capsule 100 mg, 100 mg, Oral, BID, Judith Part, MD, 100 mg at 08/21/18 2132 .  feeding supplement (ENSURE ENLIVE) (ENSURE ENLIVE) liquid 237 mL, 237 mL, Oral, BID BM, ,  A, MD, 237 mL at 08/21/18 1011 .  feeding supplement (PRO-STAT SUGAR FREE 64) liquid 30 mL, 30 mL, Oral, BID, , Joyice Faster, MD, 30 mL at 08/21/18 2134 .  HYDROcodone-acetaminophen (NORCO/VICODIN) 5-325 MG per tablet 1-2 tablet, 1-2 tablet, Oral, Q4H PRN, Judith Part, MD, 1 tablet at 08/21/18 2000 .  HYDROmorphone (DILAUDID) injection 0.5 mg, 0.5 mg, Intravenous, Once, ,  A, MD .  HYDROmorphone (DILAUDID) injection 0.5 mg, 0.5 mg, Intravenous, Q3H PRN, Judith Part, MD, 0.5 mg at 08/21/18 1558 .  insulin aspart (novoLOG) injection 0-20 Units, 0-20 Units, Subcutaneous, TID WC, Judith Part, MD, 11 Units at 08/22/18 0033 .  insulin glargine (LANTUS) injection 10 Units, 10 Units, Subcutaneous, Daily, Judith Part, MD, 10 Units at 08/21/18 1846 .  lactated ringers infusion, , Intravenous, Continuous, , Joyice Faster, MD, Last Rate: 10 mL/hr at 08/20/18 1525 .  levothyroxine (SYNTHROID, LEVOTHROID) tablet 137 mcg, 137 mcg, Oral, Q0600, Judith Part, MD,  137 mcg at 08/21/18 0609 .  lisinopril (PRINIVIL,ZESTRIL) tablet 20 mg, 20 mg, Oral, Daily, ,  A, MD, 20 mg at 08/21/18 1001 .  metoprolol tartrate (LOPRESSOR) tablet 50 mg, 50 mg, Oral, BID, Judith Part, MD, 50 mg at 08/21/18 2134 .  mupirocin ointment (BACTROBAN) 2 % 1 application, 1 application, Nasal, BID, Judith Part, MD, 1 application at 98/26/41 2134 .  ondansetron (ZOFRAN) tablet 4 mg, 4 mg, Oral, Q6H PRN **OR** ondansetron (ZOFRAN) injection 4 mg, 4 mg, Intravenous, Q6H PRN, ,  A, MD .  pregabalin (LYRICA) capsule 150 mg, 150 mg, Oral, BID, Judith Part, MD, 150 mg at  08/21/18 2132 .  triamterene-hydrochlorothiazide (MAXZIDE-25) 37.5-25 MG per tablet 1 tablet, 1 tablet, Oral, Daily, , Joyice Faster, MD, 1 tablet at 08/21/18 1001 .  venlafaxine XR (EFFEXOR-XR) 24 hr capsule 75 mg, 75 mg, Oral, Q breakfast, , Joyice Faster, MD, 75 mg at 08/21/18 1000   Physical Exam: AOx3, PERRL, EOMI, FS, Strength 5/5 x4, SILTx4 Incision w/ dressing w/ serous staining  Assessment & Plan: 78 y.o. woman s/p prior lumbar laminectomies now s/p wound revision, recovering well. -f/u AM RFP for Na and GFR -will c/s medicine service for tighter BG control and better home-going regimen to optimize wound healing -SCDs/TEDs  Judith Part  08/22/18 1:57 AM

## 2018-08-23 LAB — GLUCOSE, CAPILLARY
Glucose-Capillary: 246 mg/dL — ABNORMAL HIGH (ref 70–99)
Glucose-Capillary: 312 mg/dL — ABNORMAL HIGH (ref 70–99)
Glucose-Capillary: 216 mg/dL — ABNORMAL HIGH (ref 70–99)
Glucose-Capillary: 185 mg/dL — ABNORMAL HIGH (ref 70–99)

## 2018-08-23 MED ORDER — HEPARIN SODIUM (PORCINE) 5000 UNIT/ML IJ SOLN
5000.0000 [IU] | Freq: Three times a day (TID) | INTRAMUSCULAR | Status: DC
  Administered 2018-08-23 – 2018-08-24 (×4): 5000 [IU] via SUBCUTANEOUS
  Filled 2018-08-23 (×4): qty 1

## 2018-08-23 MED ORDER — INSULIN ASPART 100 UNIT/ML ~~LOC~~ SOLN
3.0000 [IU] | Freq: Three times a day (TID) | SUBCUTANEOUS | Status: DC
  Administered 2018-08-23 – 2018-08-24 (×4): 3 [IU] via SUBCUTANEOUS

## 2018-08-23 NOTE — Progress Notes (Signed)
Neurosurgery Service Progress Note  Subjective: Home glargine dose restarted yesterday, BG improving  Objective: Vitals:   08/22/18 1511 08/22/18 2006 08/22/18 2356 08/23/18 0419  BP: 138/68 (!) 114/40 (!) 129/48 (!) 138/53  Pulse: 85 84 68 72  Resp: 20 18 18 18   Temp: 98.8 F (37.1 C) 98.5 F (36.9 C) 98.6 F (37 C) 98.3 F (36.8 C)  TempSrc: Oral Oral Oral Oral  SpO2: 94% 94% 98% 93%  Weight:      Height:       Temp (24hrs), Avg:98.5 F (36.9 C), Min:98.3 F (36.8 C), Max:98.8 F (37.1 C)  CBC Latest Ref Rng & Units 08/17/2018 08/09/2018 07/03/2018  WBC 4.0 - 10.5 K/uL 13.3(H) 17.2(H) 14.7(H)  Hemoglobin 12.0 - 15.0 g/dL 12.9 13.9 13.2  Hematocrit 36.0 - 46.0 % 41.8 42.9 44.2  Platelets 150 - 400 K/uL 292 430(H) 348   BMP Latest Ref Rng & Units 08/22/2018 08/21/2018 08/09/2018  Glucose 70 - 99 mg/dL 270(H) 446(H) 45(L)  BUN 8 - 23 mg/dL 22 26(H) 19  Creatinine 0.44 - 1.00 mg/dL 0.93 1.11(H) 1.00(H)  BUN/Creat Ratio 6 - 22 (calc) - - 19  Sodium 135 - 145 mmol/L 135 134(L) 142  Potassium 3.5 - 5.1 mmol/L 4.0 5.0 4.1  Chloride 98 - 111 mmol/L 99 98 104  CO2 22 - 32 mmol/L 28 28 30   Calcium 8.9 - 10.3 mg/dL 8.5(L) 8.8(L) 9.8    Intake/Output Summary (Last 24 hours) at 08/23/2018 0734 Last data filed at 08/23/2018 0500 Gross per 24 hour  Intake 480 ml  Output 1650 ml  Net -1170 ml    Current Facility-Administered Medications:  .  acetaminophen (TYLENOL) tablet 650 mg, 650 mg, Oral, Q6H PRN, 650 mg at 08/21/18 1544 **OR** acetaminophen (TYLENOL) suppository 650 mg, 650 mg, Rectal, Q6H PRN, Judith Part, MD, 650 mg at 08/20/18 1228 .  alum & mag hydroxide-simeth (MAALOX/MYLANTA) 200-200-20 MG/5ML suspension 30 mL, 30 mL, Oral, Q6H PRN, Judith Part, MD, 30 mL at 08/18/18 1800 .  amLODipine (NORVASC) tablet 10 mg, 10 mg, Oral, Daily, Judith Part, MD, 10 mg at 08/22/18 0927 .  docusate sodium (COLACE) capsule 100 mg, 100 mg, Oral, BID, Judith Part, MD, 100 mg at 08/22/18 2111 .  feeding supplement (ENSURE ENLIVE) (ENSURE ENLIVE) liquid 237 mL, 237 mL, Oral, BID BM, Shauntee Karp A, MD, 237 mL at 08/22/18 1000 .  feeding supplement (PRO-STAT SUGAR FREE 64) liquid 30 mL, 30 mL, Oral, BID, Judith Part, MD, 30 mL at 08/22/18 2111 .  HYDROcodone-acetaminophen (NORCO/VICODIN) 5-325 MG per tablet 1-2 tablet, 1-2 tablet, Oral, Q4H PRN, Judith Part, MD, 2 tablet at 08/22/18 2120 .  HYDROmorphone (DILAUDID) injection 0.5 mg, 0.5 mg, Intravenous, Once, Omran Keelin A, MD .  HYDROmorphone (DILAUDID) injection 0.5 mg, 0.5 mg, Intravenous, Q3H PRN, Judith Part, MD, 0.5 mg at 08/22/18 1503 .  insulin aspart (novoLOG) injection 0-20 Units, 0-20 Units, Subcutaneous, TID WC, Judith Part, MD, 11 Units at 08/22/18 1725 .  insulin glargine (LANTUS) injection 100 Units, 100 Units, Subcutaneous, QHS, Karmen Bongo, MD, 100 Units at 08/22/18 1456 .  lactated ringers infusion, , Intravenous, Continuous, Aerionna [place], Joyice Faster, MD, Last Rate: 10 mL/hr at 08/20/18 1525 .  levothyroxine (SYNTHROID, LEVOTHROID) tablet 137 mcg, 137 mcg, Oral, Q0600, Judith Part, MD, 137 mcg at 08/23/18 0709 .  lisinopril (PRINIVIL,ZESTRIL) tablet 20 mg, 20 mg, Oral, Daily, Judith Part, MD, 20 mg at 08/22/18 0928 .  metoprolol tartrate (LOPRESSOR) tablet 50 mg, 50 mg, Oral, BID, Judith Part, MD, 50 mg at 08/22/18 2110 .  ondansetron (ZOFRAN) tablet 4 mg, 4 mg, Oral, Q6H PRN **OR** ondansetron (ZOFRAN) injection 4 mg, 4 mg, Intravenous, Q6H PRN, Sargun Rummell A, MD .  pregabalin (LYRICA) capsule 150 mg, 150 mg, Oral, BID, Judith Part, MD, 150 mg at 08/22/18 2110 .  triamterene-hydrochlorothiazide (MAXZIDE-25) 37.5-25 MG per tablet 1 tablet, 1 tablet, Oral, Daily, Judith Part, MD, 1 tablet at 08/22/18 [phone] .  venlafaxine XR (EFFEXOR-XR) 24 hr capsule 75 mg, 75 mg, Oral, Q breakfast, Maxton Noreen, Joyice Faster, MD, 75 mg at 08/22/18 1497   Physical Exam: AOx3, PERRL, EOMI, FS, Strength 5/5 x4, SILTx4 Incision w/ dressing w/ serous staining  Assessment & Plan: 78 y.o. woman s/p prior lumbar laminectomies now s/p wound revision, recovering well. -f/u hospitalist recs -SCDs/TEDs, start SQH -likely discharge tomorrow if BG continue to improve  Judith Part  08/23/18 7:34 AM

## 2018-08-23 NOTE — Progress Notes (Signed)
PROGRESS NOTE    Katherine Delgado  LFY:[id number] DOB: [birth year] DOA: 08/17/2018 PCP: Gayland Curry, DO  Brief Narrative:Wound dehiscence after laminectomy, went to OR for wound revision.    CBGs in the 300-400 range Dr. Zada Finders consulted IM for assistance with diabetes control to improve wound healing   Assessment & Plan:   Wound dehiscence -Per neurosurgery, plan for home health services and home health RN at discharge  Uncontrolled diabetes mellitus -Most recent hemoglobin A1c was 7.4 -Restarted on long-acting insulin, now on Lantus 100 units nightly, takes Toujeo at home -Also add meal coverage and continue sliding scale, continue to hold Victoza -Anticipate further improvement in CBGs in the next 24 hours -We will follow  Rest of her chronic medical problems remained stable    Antimicrobials:    Subjective: -Better, CBGs better controlled as well  Objective: Vitals:   08/22/18 2356 08/23/18 0419 08/23/18 0900 08/23/18 1201  BP: (!) 129/48 (!) 138/53 (!) 147/80 (!) 141/52  Pulse: 68 72 96 70  Resp: 18 18 20 16   Temp: 98.6 F (37 C) 98.3 F (36.8 C) 98.4 F (36.9 C) 99.3 F (37.4 C)  TempSrc: Oral Oral Oral Oral  SpO2: 98% 93% 96% 97%  Weight:      Height:        Intake/Output Summary (Last 24 hours) at 08/23/2018 1404 Last data filed at 08/23/2018 0848 Gross per 24 hour  Intake 720 ml  Output 1650 ml  Net -930 ml   Filed Weights   08/17/18 2125 08/20/18 1518  Weight: 115 kg 115 kg    Examination:  General exam: Appears calm and comfortable, no distress Respiratory system: Clear to auscultation. Respiratory effort normal. Cardiovascular system: S1 & S2 heard, RRR Gastrointestinal system: Abdomen is nondistended, soft and nontender.Normal bowel sounds heard. Central nervous system: Alert and oriented. No focal neurological deficits. Extremities: No edema Skin: Dressing over lumbar spine Psychiatry: Judgement and insight appear normal. Mood & affect  appropriate.     Data Reviewed:   CBC: Recent Labs  Lab 08/17/18 1817  WBC 13.3*  HGB 12.9  HCT 41.8  MCV 87.1  PLT 852   Basic Metabolic Panel: Recent Labs  Lab 08/21/18 1208 08/22/18 0447  NA 134* 135  K 5.0 4.0  CL 98 99  CO2 28 28  GLUCOSE 446* 270*  BUN 26* 22  CREATININE 1.11* 0.93  CALCIUM 8.8* 8.5*  PHOS  --  2.5   GFR: Estimated Creatinine Clearance: 65.3 mL/min (by C-G formula based on SCr of 0.93 mg/dL). Liver Function Tests: Recent Labs  Lab 08/22/18 0447  ALBUMIN 2.6*   No results for input(s): LIPASE, AMYLASE in the last 168 hours. No results for input(s): AMMONIA in the last 168 hours. Coagulation Profile: No results for input(s): INR, PROTIME in the last 168 hours. Cardiac Enzymes: No results for input(s): CKTOTAL, CKMB, CKMBINDEX, TROPONINI in the last 168 hours. BNP (last 3 results) No results for input(s): PROBNP in the last 8760 hours. HbA1C: Recent Labs    08/21/18 1208  HGBA1C 6.6*   CBG: Recent Labs  Lab 08/22/18 1115 08/22/18 1614 08/22/18 2118 08/23/18 0612 08/23/18 1159  GLUCAP 406* 297* 306* 246* 312*   Lipid Profile: No results for input(s): CHOL, HDL, LDLCALC, TRIG, CHOLHDL, LDLDIRECT in the last 72 hours. Thyroid Function Tests: No results for input(s): TSH, T4TOTAL, FREET4, T3FREE, THYROIDAB in the last 72 hours. Anemia Panel: No results for input(s): VITAMINB12, FOLATE, FERRITIN, TIBC, IRON, RETICCTPCT in the  last 72 hours. Urine analysis:    Component Value Date/Time   COLORURINE YELLOW 01/08/2016 1952   APPEARANCEUR CLEAR 01/08/2016 1952   APPEARANCEUR Cloudy (A) 04/16/2013 1110   LABSPEC >1.046 (H) 01/08/2016 1952   PHURINE 6.5 01/08/2016 1952   GLUCOSEU NEGATIVE 01/08/2016 1952   HGBUR NEGATIVE 01/08/2016 1952   BILIRUBINUR neg 05/24/2018 1104   BILIRUBINUR Negative 04/16/2013 [place] 01/08/2016 1952   PROTEINUR Negative 05/24/2018 1104   PROTEINUR NEGATIVE 01/08/2016 1952    UROBILINOGEN negative (A) 05/24/2018 1104   UROBILINOGEN 0.2 10/20/2011 1243   NITRITE positive 05/24/2018 1104   NITRITE NEGATIVE 01/08/2016 1952   LEUKOCYTESUR Large (3+) (A) 05/24/2018 1104   LEUKOCYTESUR 3+ (A) 04/16/2013 1110   Sepsis Labs: @LABRCNTIP (procalcitonin:4,lacticidven:4)  ) Recent Results (from the past 240 hour(s))  Surgical PCR screen     Status: None   Collection Time: 08/17/18  9:49 PM  Result Value Ref Range Status   MRSA, PCR NEGATIVE NEGATIVE Final   Staphylococcus aureus NEGATIVE NEGATIVE Final    Comment: (NOTE) The Xpert SA Assay (FDA approved for NASAL specimens in patients 78 years of age and older), is one component of a comprehensive surveillance program. It is not intended to diagnose infection nor to guide or monitor treatment. Performed at [facility], [address] [place] [place], [place] [address]          Radiology Studies: No results found.      Scheduled Meds: . amLODipine  10 mg Oral Daily  . docusate sodium  100 mg Oral BID  . feeding supplement (ENSURE ENLIVE)  237 mL Oral BID BM  . feeding supplement (PRO-STAT SUGAR FREE 64)  30 mL Oral BID  . heparin injection (subcutaneous)  5,000 Units Subcutaneous Q8H  .  HYDROmorphone (DILAUDID) injection  0.5 mg Intravenous Once  . insulin aspart  0-20 Units Subcutaneous TID WC  . insulin aspart  3 Units Subcutaneous TID WC  . insulin glargine  100 Units Subcutaneous QHS  . levothyroxine  137 mcg Oral Q0600  . lisinopril  20 mg Oral Daily  . metoprolol tartrate  50 mg Oral BID  . pregabalin  150 mg Oral BID  . triamterene-hydrochlorothiazide  1 tablet Oral Daily  . venlafaxine XR  75 mg Oral Q breakfast   Continuous Infusions:   LOS: 6 days    Time spent: 59min    Domenic Polite, MD [facility] Page via [email or web address], password [email or web address] After 7PM please contact night-coverage  08/23/2018, 2:04 PM

## 2018-08-24 LAB — GLUCOSE, CAPILLARY
Glucose-Capillary: 192 mg/dL — ABNORMAL HIGH (ref 70–99)
Glucose-Capillary: 185 mg/dL — ABNORMAL HIGH (ref 70–99)
Glucose-Capillary: 263 mg/dL — ABNORMAL HIGH (ref 70–99)

## 2018-08-24 NOTE — Progress Notes (Signed)
CBgs much improved, recommend DC back home on home regimen of Tujeo 100units daily, Victoza  Domenic Polite, MD

## 2018-08-24 NOTE — Plan of Care (Signed)
Mod assist adls 

## 2018-08-24 NOTE — Care Management Note (Signed)
Case Management Note  Patient Details  Name: SOLIMAR MAIDEN MRN: [id number] Date of Birth: 22-Apr-1941  Subjective/Objective:                    Action/Plan: Pt is discharging home today. CM called Cassie with [facility] and informed her of the d/c and the addition of the RN.  Pt calling son to provide transportation home.   Expected Discharge Date:  08/24/18               Expected Discharge Plan:  [place]  In-House Referral:     Discharge planning Services  CM Consult  Post Acute Care Choice:  Home Health, Resumption of Svcs/PTA Provider Choice offered to:  Patient  DME Arranged:    DME Agency:     HH Arranged:  Nurse's Aide, PT [place] Agency:  [facility]  Status of Service:  Completed, signed off  If discussed at [place] of Stay Meetings, dates discussed:    Additional Comments:  Pollie Friar, RN 08/24/2018, 11:15 AM

## 2018-08-24 NOTE — Progress Notes (Signed)
Neurosurgery Service Progress Note  Subjective: Home glargine dose restarted yesterday, BG improving  Objective: Vitals:   08/23/18 2019 08/23/18 2341 08/24/18 0417 08/24/18 0807  BP: (!) 147/47 (!) 154/58 (!) 153/61 (!) 128/52  Pulse: 77 74 72 70  Resp:    20  Temp: 98.6 F (37 C) 98.4 F (36.9 C) 97.6 F (36.4 C) (!) 97.5 F (36.4 C)  TempSrc: Oral Oral Oral Oral  SpO2: 96% 92% 95% 99%  Weight:      Height:       Temp (24hrs), Avg:98.3 F (36.8 C), Min:97.5 F (36.4 C), Max:99.3 F (37.4 C)  CBC Latest Ref Rng & Units 08/17/2018 08/09/2018 07/03/2018  WBC 4.0 - 10.5 K/uL 13.3(H) 17.2(H) 14.7(H)  Hemoglobin 12.0 - 15.0 g/dL 12.9 13.9 13.2  Hematocrit 36.0 - 46.0 % 41.8 42.9 44.2  Platelets 150 - 400 K/uL 292 430(H) 348   BMP Latest Ref Rng & Units 08/22/2018 08/21/2018 08/09/2018  Glucose 70 - 99 mg/dL 270(H) 446(H) 45(L)  BUN 8 - 23 mg/dL 22 26(H) 19  Creatinine 0.44 - 1.00 mg/dL 0.93 1.11(H) 1.00(H)  BUN/Creat Ratio 6 - 22 (calc) - - 19  Sodium 135 - 145 mmol/L 135 134(L) 142  Potassium 3.5 - 5.1 mmol/L 4.0 5.0 4.1  Chloride 98 - 111 mmol/L 99 98 104  CO2 22 - 32 mmol/L 28 28 30   Calcium 8.9 - 10.3 mg/dL 8.5(L) 8.8(L) 9.8    Intake/Output Summary (Last 24 hours) at 08/24/2018 0901 Last data filed at 08/24/2018 0500 Gross per 24 hour  Intake 240 ml  Output 800 ml  Net -560 ml    Current Facility-Administered Medications:  .  acetaminophen (TYLENOL) tablet 650 mg, 650 mg, Oral, Q6H PRN, 650 mg at 08/21/18 1544 **OR** acetaminophen (TYLENOL) suppository 650 mg, 650 mg, Rectal, Q6H PRN, Judith Part, MD, 650 mg at 08/20/18 1228 .  alum & mag hydroxide-simeth (MAALOX/MYLANTA) 200-200-20 MG/5ML suspension 30 mL, 30 mL, Oral, Q6H PRN, Judith Part, MD, 30 mL at 08/18/18 1800 .  amLODipine (NORVASC) tablet 10 mg, 10 mg, Oral, Daily, Judith Part, MD, 10 mg at 08/24/18 0846 .  docusate sodium (COLACE) capsule 100 mg, 100 mg, Oral, BID, Judith Part, MD, 100 mg at 08/24/18 0847 .  feeding supplement (ENSURE ENLIVE) (ENSURE ENLIVE) liquid 237 mL, 237 mL, Oral, BID BM, Judith Part, MD, 237 mL at 08/24/18 0847 .  feeding supplement (PRO-STAT SUGAR FREE 64) liquid 30 mL, 30 mL, Oral, BID, Jaydon Avina A, MD, 30 mL at 08/24/18 0847 .  heparin injection 5,000 Units, 5,000 Units, Subcutaneous, Q8H, Judith Part, MD, 5,000 Units at 08/24/18 0528 .  HYDROcodone-acetaminophen (NORCO/VICODIN) 5-325 MG per tablet 1-2 tablet, 1-2 tablet, Oral, Q4H PRN, Judith Part, MD, 2 tablet at 08/24/18 0532 .  HYDROmorphone (DILAUDID) injection 0.5 mg, 0.5 mg, Intravenous, Once, Charlene Detter A, MD .  HYDROmorphone (DILAUDID) injection 0.5 mg, 0.5 mg, Intravenous, Q3H PRN, Judith Part, MD, 0.5 mg at 08/22/18 1503 .  insulin aspart (novoLOG) injection 0-20 Units, 0-20 Units, Subcutaneous, TID WC, Judith Part, MD, 3 Units at 08/24/18 [phone] .  insulin aspart (novoLOG) injection 3 Units, 3 Units, Subcutaneous, TID WC, Domenic Polite, MD, 3 Units at 08/24/18 0848 .  insulin glargine (LANTUS) injection 100 Units, 100 Units, Subcutaneous, QHS, Karmen Bongo, MD, 100 Units at 08/23/18 2121 .  levothyroxine (SYNTHROID, LEVOTHROID) tablet 137 mcg, 137 mcg, Oral, Q0600, Judith Part, MD, 137 mcg  at 08/24/18 0527 .  lisinopril (PRINIVIL,ZESTRIL) tablet 20 mg, 20 mg, Oral, Daily, Judith Part, MD, 20 mg at 08/24/18 0847 .  metoprolol tartrate (LOPRESSOR) tablet 50 mg, 50 mg, Oral, BID, Judith Part, MD, 50 mg at 08/24/18 0846 .  ondansetron (ZOFRAN) tablet 4 mg, 4 mg, Oral, Q6H PRN **OR** ondansetron (ZOFRAN) injection 4 mg, 4 mg, Intravenous, Q6H PRN, Giovoni Bunch A, MD .  pregabalin (LYRICA) capsule 150 mg, 150 mg, Oral, BID, Judith Part, MD, 150 mg at 08/24/18 0846 .  triamterene-hydrochlorothiazide (MAXZIDE-25) 37.5-25 MG per tablet 1 tablet, 1 tablet, Oral, Daily, Judith Part, MD,  1 tablet at 08/24/18 0850 .  venlafaxine XR (EFFEXOR-XR) 24 hr capsule 75 mg, 75 mg, Oral, Q breakfast, Payslee Bateson, Joyice Faster, MD, 75 mg at 08/24/18 0518   Physical Exam: AOx3, PERRL, EOMI, FS, Strength 5/5 x4, SILTx4 Incision w/ dressing w/ serous staining  Assessment & Plan: 78 y.o. woman s/p prior lumbar laminectomies now s/p wound revision, recovering well. -appreciate assistance from hospitalist service, CBGs much better, okay for discharge home today -SCDs/TEDs, Jackey Loge  08/24/18 9:01 AM

## 2018-08-24 NOTE — Care Management Important Message (Signed)
Important Message  Patient Details  Name: Katherine Delgado MRN: [id number] Date of Birth: 08-07-1941   Medicare Important Message Given:  Yes    Mystic Labo 08/24/2018, 3:00 PM

## 2018-08-25 DIAGNOSIS — R2689 Other abnormalities of gait and mobility: Secondary | ICD-10-CM | POA: Diagnosis not present

## 2018-08-25 DIAGNOSIS — L89892 Pressure ulcer of other site, stage 2: Secondary | ICD-10-CM | POA: Diagnosis not present

## 2018-08-25 DIAGNOSIS — I10 Essential (primary) hypertension: Secondary | ICD-10-CM | POA: Diagnosis not present

## 2018-08-25 DIAGNOSIS — F331 Major depressive disorder, recurrent, moderate: Secondary | ICD-10-CM | POA: Diagnosis not present

## 2018-08-25 DIAGNOSIS — E1140 Type 2 diabetes mellitus with diabetic neuropathy, unspecified: Secondary | ICD-10-CM | POA: Diagnosis not present

## 2018-08-25 DIAGNOSIS — T8140XD Infection following a procedure, unspecified, subsequent encounter: Secondary | ICD-10-CM | POA: Diagnosis not present

## 2018-08-27 ENCOUNTER — Telehealth: Payer: Self-pay | Admitting: *Deleted

## 2018-08-27 DIAGNOSIS — G8929 Other chronic pain: Secondary | ICD-10-CM

## 2018-08-27 DIAGNOSIS — T8140XD Infection following a procedure, unspecified, subsequent encounter: Secondary | ICD-10-CM | POA: Diagnosis not present

## 2018-08-27 DIAGNOSIS — I10 Essential (primary) hypertension: Secondary | ICD-10-CM | POA: Diagnosis not present

## 2018-08-27 DIAGNOSIS — R2689 Other abnormalities of gait and mobility: Secondary | ICD-10-CM | POA: Diagnosis not present

## 2018-08-27 DIAGNOSIS — E1140 Type 2 diabetes mellitus with diabetic neuropathy, unspecified: Secondary | ICD-10-CM | POA: Diagnosis not present

## 2018-08-27 DIAGNOSIS — F331 Major depressive disorder, recurrent, moderate: Secondary | ICD-10-CM | POA: Diagnosis not present

## 2018-08-27 DIAGNOSIS — L89892 Pressure ulcer of other site, stage 2: Secondary | ICD-10-CM | POA: Diagnosis not present

## 2018-08-27 DIAGNOSIS — M5441 Lumbago with sciatica, right side: Principal | ICD-10-CM

## 2018-08-27 MED ORDER — OXYCODONE HCL 5 MG PO TABS: 5.0000 mg | ORAL_TABLET | ORAL | 0 refills | Status: DC | PRN

## 2018-08-27 NOTE — Telephone Encounter (Signed)
Transition Care Management Follow-up Telephone Call  Date of discharge and from where: 08/21/2018 from Neurosurgery  How have you been since you were released from the hospital? Had to have 2nd surgery on back due to infection.  Any questions or concerns? No  Items Reviewed:  Did the pt receive and understand the discharge instructions provided? Does not understand why Dr. Zada Finders D/C her Hydrocodone. Understands everything else.   Medications obtained and verified? Yes, she just has concerns with pain medication, why the Hydrocodone was stopped. Stated that she is going to speak with Dr. Zada Finders regarding this in 2 weeks.   Any new allergies since your discharge? No  Dietary orders reviewed? Yes  Do you have support at home? Son lives with her  Other (ie: DME, Home Health, etc) [facility] coming to home. She has 2 nurses a week coming out. PT, OT.  Functional Questionnaire: (I = Independent and D = Dependent) ADL's: Independent with assistance  Bathing/Dressing- Independent with Assistance   Meal Prep- son helps her  Eating- Independent  Maintaining continence- Independent  Transferring/Ambulation- Uses walker, Wheelchair for assistance  Managing Meds- Independent. Uses pill box a week supply at a time    Follow up appointments reviewed:    PCP Hospital f/u appt confirmed? YES Scheduled to see Dr. Mariea Clonts on 08/30/18 @ 1:30pm.  [facility] f/u appt confirmed?  She has placed a call to Dr. Merton Border office.  Are transportation arrangements needed? No  If their condition worsens, is the pt aware to call  their PCP or go to the ED?  Yes  Was the patient provided with contact information for the PCP's office or ED? Yes  Was the pt encouraged to call back with questions or concerns? Yes

## 2018-08-27 NOTE — Telephone Encounter (Signed)
Her hydrocodone was stopped at the hospital per the discharge after visit summary in media.  Dr. Zada Finders gave her 30 oxycodone at that time to be taken every 4 hours as needed for severe pain.  Has she used those up?  If so, we will renew them.  She will no longer be on hydrocodone.  We need to simplify her regimen.

## 2018-08-27 NOTE — Telephone Encounter (Signed)
Patient notified and agreed. Patient stated that she has 6 tablets left. Wants to know if you will go ahead and refill it for Oxycodone 5mg  One tablet every 4 hours as needed.   Pended Rx and sent to Dr. Mariea Clonts for approval.

## 2018-08-27 NOTE — Telephone Encounter (Signed)
Patient called requesting refill on her Hydrocodone 10-325 Take one tablet by mouth every 3 hours as needed.   Medication is NOT in current medication list. It is listed in last OV note dated 08/09/2018.   [place] Verified LR: 07/27/2018  Is this ok to add back to medication list and refill. Please Advise.

## 2018-08-28 ENCOUNTER — Encounter: Payer: Self-pay | Admitting: Physician Assistant

## 2018-08-28 ENCOUNTER — Telehealth: Payer: Self-pay

## 2018-08-28 ENCOUNTER — Ambulatory Visit (INDEPENDENT_AMBULATORY_CARE_PROVIDER_SITE_OTHER): Payer: Medicare Other | Admitting: Rheumatology

## 2018-08-28 VITALS — BP 137/74 | HR 71 | Resp 14 | Ht 66.0 in | Wt 236.8 lb

## 2018-08-28 DIAGNOSIS — Z8659 Personal history of other mental and behavioral disorders: Secondary | ICD-10-CM

## 2018-08-28 DIAGNOSIS — M810 Age-related osteoporosis without current pathological fracture: Secondary | ICD-10-CM

## 2018-08-28 DIAGNOSIS — T8140XD Infection following a procedure, unspecified, subsequent encounter: Secondary | ICD-10-CM | POA: Diagnosis not present

## 2018-08-28 DIAGNOSIS — Z8669 Personal history of other diseases of the nervous system and sense organs: Secondary | ICD-10-CM

## 2018-08-28 DIAGNOSIS — Z8709 Personal history of other diseases of the respiratory system: Secondary | ICD-10-CM

## 2018-08-28 DIAGNOSIS — M3501 Sicca syndrome with keratoconjunctivitis: Secondary | ICD-10-CM

## 2018-08-28 DIAGNOSIS — Z8679 Personal history of other diseases of the circulatory system: Secondary | ICD-10-CM

## 2018-08-28 DIAGNOSIS — M170 Bilateral primary osteoarthritis of knee: Secondary | ICD-10-CM

## 2018-08-28 DIAGNOSIS — I10 Essential (primary) hypertension: Secondary | ICD-10-CM | POA: Diagnosis not present

## 2018-08-28 DIAGNOSIS — M5136 Other intervertebral disc degeneration, lumbar region: Secondary | ICD-10-CM

## 2018-08-28 DIAGNOSIS — Z8639 Personal history of other endocrine, nutritional and metabolic disease: Secondary | ICD-10-CM

## 2018-08-28 DIAGNOSIS — Z79899 Other long term (current) drug therapy: Secondary | ICD-10-CM

## 2018-08-28 DIAGNOSIS — R2689 Other abnormalities of gait and mobility: Secondary | ICD-10-CM | POA: Diagnosis not present

## 2018-08-28 DIAGNOSIS — M0579 Rheumatoid arthritis with rheumatoid factor of multiple sites without organ or systems involvement: Secondary | ICD-10-CM

## 2018-08-28 DIAGNOSIS — G4709 Other insomnia: Secondary | ICD-10-CM

## 2018-08-28 DIAGNOSIS — Z8719 Personal history of other diseases of the digestive system: Secondary | ICD-10-CM

## 2018-08-28 DIAGNOSIS — M797 Fibromyalgia: Secondary | ICD-10-CM

## 2018-08-28 DIAGNOSIS — F331 Major depressive disorder, recurrent, moderate: Secondary | ICD-10-CM | POA: Diagnosis not present

## 2018-08-28 DIAGNOSIS — L89892 Pressure ulcer of other site, stage 2: Secondary | ICD-10-CM | POA: Diagnosis not present

## 2018-08-28 DIAGNOSIS — E1140 Type 2 diabetes mellitus with diabetic neuropathy, unspecified: Secondary | ICD-10-CM | POA: Diagnosis not present

## 2018-08-28 NOTE — Telephone Encounter (Signed)
Please apply for right knee visco, per Dr. Deveshwar. Thanks! 

## 2018-08-30 ENCOUNTER — Encounter: Payer: Self-pay | Admitting: Internal Medicine

## 2018-08-30 ENCOUNTER — Ambulatory Visit (INDEPENDENT_AMBULATORY_CARE_PROVIDER_SITE_OTHER): Payer: Medicare Other | Admitting: Internal Medicine

## 2018-08-30 VITALS — BP 120/60 | HR 61 | Temp 97.8°F | Ht 66.0 in | Wt 238.0 lb

## 2018-08-30 DIAGNOSIS — M797 Fibromyalgia: Secondary | ICD-10-CM

## 2018-08-30 DIAGNOSIS — M8949 Other hypertrophic osteoarthropathy, multiple sites: Secondary | ICD-10-CM

## 2018-08-30 DIAGNOSIS — R350 Frequency of micturition: Secondary | ICD-10-CM | POA: Diagnosis not present

## 2018-08-30 DIAGNOSIS — M150 Primary generalized (osteo)arthritis: Secondary | ICD-10-CM | POA: Diagnosis not present

## 2018-08-30 DIAGNOSIS — M159 Polyosteoarthritis, unspecified: Secondary | ICD-10-CM

## 2018-08-30 DIAGNOSIS — T8131XS Disruption of external operation (surgical) wound, not elsewhere classified, sequela: Secondary | ICD-10-CM | POA: Diagnosis not present

## 2018-08-30 NOTE — Progress Notes (Signed)
Location:  [facility] clinic Provider: Binh Doten L. Mariea Clonts, D.O., C.M.D.  Goals of Care:  Advanced Directives 08/17/2018  Does Patient Have a Medical Advance Directive? Yes;No  Type of Advance Directive Living will  Does patient want to make changes to medical advance directive? No - Patient declined  Copy of [place] in Chart? -  Would patient like information on creating a medical advance directive? No - Patient declined  Pre-existing out of facility DNR order (yellow form or pink MOST form) -  has living will already but has not had a HCPOA   Chief Complaint  Patient presents with  . Transitions Of Care    08/24/2018     HPI: Patient is a 78 y.o. female seen today for hospital follow-up s/p admission from 12/27 to 08/24/18 for lumbar spine surgery wound dehiscence from prior laminectomy.  She was directly admitted due to concerns for infection and then the wound dehiscence.  She had debridement and wound revision on 08/20/18.   She was discharged home on 12/31 with home health. Per her surgeon's discharge summary, hydrocodone was discontinued.  She was also on dilaudid during her hospitalization but it was stopped.  She was sent home on oxycodone which will also need gradual tapering.      It is draining a lot.  There have been days where it didn't look too hot and others where it was ok.  [place] continues to come out.  Dressing change being done bid.  The drainage is not dark or malodorous now.  There has been some slight redness on the upper aspect per her son.  Some greenish yellowish goo is still coming out.  Her f/u with Dr. Zada Finders is tomorrow.    Calcium alginate is still being used in the open area. Pain is 3-4 mostly.  If she's active, it might be a little worse.  She can handle it.    Thinks she has a UTI.  Has been using pads for her incontinence.  Had the wick when at the hospital  She's having dysuria, pain and increased frequency.    Past Medical History:    Diagnosis Date  . Abnormal weight gain   . Abnormality of gait 04/19/2016  . Acute infective polyneuritis ([place]) 2002  . Allergic rhinitis due to pollen   . Chronic pain syndrome   . COPD (chronic obstructive pulmonary disease) (HCC)    chronic bronchitis  . Depressive disorder, not elsewhere classified   . Diaphragmatic hernia without mention of obstruction or gangrene   . Dyslipidemia   . Fibromyalgia   . GERD (gastroesophageal reflux disease)    with h/o esophagitis  . Guillain-Barre syndrome ([place])   . History of benign thymus tumor   . Insomnia, unspecified   . Lumbar spinal stenosis 03/30/2018   L4-5 level, severe  . Miscarriage 1962  . Mixed hyperlipidemia   . Morbid obesity ([place])   . Osteoporosis   . Pneumonia   . Polyneuropathy in diabetes(357.2)   . Rheumatoid arthritis(714.0)   . Spondylosis, lumbosacral   . Spontaneous ecchymoses   . Tear film insufficiency, unspecified   . Type II or unspecified type diabetes mellitus with peripheral circulatory disorders, uncontrolled(250.72)   . Unspecified chronic bronchitis ([place])   . Unspecified essential hypertension   . Unspecified hypothyroidism   . Unspecified pruritic disorder   . Unspecified urinary incontinence     Past Surgical History:  Procedure Laterality Date  . ABDOMINAL HYSTERECTOMY  1974  .  abdominal tumor  2002  . APPENDECTOMY    . [place]  . BRONCHOSCOPY  2001  . CHOLECYSTECTOMY  1984  . KNEE SURGERY Bilateral 08/27/2010 (L) and 01/11/2011 (R)  . LUMBAR LAMINECTOMY/DECOMPRESSION MICRODISCECTOMY N/A 07/03/2018   Procedure: Lumbar Three to Lumbar Five Laminectomy;  Surgeon: Judith Part, MD;  Location: [place];  Service: Neurosurgery;  Laterality: N/A;  . LUMBAR WOUND DEBRIDEMENT N/A 08/20/2018   Procedure: POSTERIOR LUMBAR SPINAL WOUND DEBRIDEMENT AND REVISION;  Surgeon: Judith Part, MD;  Location: [place];  Service: Neurosurgery;  Laterality: N/A;  POSTERIOR LUMBAR SPINAL WOUND  REVISION  . [place]  . OVARIAN CYST SURGERY  1968  . thymus tumor    . 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 TONSILLECTOMY      Allergies  Allergen Reactions  . Influenza Vaccines Other (See Comments)    "h/o Guillain Barre; dr's told me years ago never to take another flu shot as it could relapse Rosalee Kaufman; has to do with when vaccine being changed to H1N1 virus"  . Penicillins Hives, Itching, Swelling and Rash    Has patient had a PCN reaction causing immediate rash, facial/tongue/throat swelling, SOB or lightheadedness with hypotension:Unknown Has patient had a PCN reaction causing severe rash involving mucus membranes or skin necrosis: Unknown Has patient had a PCN reaction that required hospitalization:Unknown Has patient had a PCN reaction occurring within the last 10 years: Unknown If all of the above answers are "NO", then may proceed with Cephalosporin use.   . Statins Other (See Comments)    Myopathy, transaminitis  . Sulfamethoxazole-Trimethoprim Itching    Outpatient Encounter Medications as of 08/30/2018  Medication Sig  . albuterol (PROVENTIL HFA;VENTOLIN HFA) 108 (90 Base) MCG/ACT inhaler Inhale 2 puffs into the lungs every 6 (six) hours as needed for wheezing or shortness of breath.  Marland Kitchen amLODipine (NORVASC) 10 MG tablet Take 1 tablet (10 mg total) by mouth daily. Please schedule appointment for refills.  . B-D UF III MINI PEN NEEDLES 31G X 5 MM MISC USE AS DIRECTED  . docusate sodium (COLACE) 100 MG capsule Take 100 mg by mouth daily.  . folic acid (FOLVITE) 1 MG tablet Take 2 tablets (2 mg total) by mouth daily.  . furosemide (LASIX) 20 MG tablet TAKE 1 TABLET BY MOUTH TWICE DAILY AS NEEDED FOR 3 POUND WEIGHT GAIN IN ONE DAY OR 5 POUNDS IN ONE WEEK  . HUMALOG KWIKPEN 200 UNIT/ML SOPN INJECT 20 UNITS UNDER THE SKIN THREE TIMES DAILY BEFORE A MEAL  . levothyroxine (SYNTHROID, LEVOTHROID) 137 MCG tablet TAKE 1 TABLET BY MOUTH DAILY BEFORE BREAKFAST ON AN EMPTY STOMACH(  LABS OVERDUE)  . lisinopril (PRINIVIL,ZESTRIL) 20 MG tablet Take 1 tablet (20 mg total) by mouth daily. Please schedule appointment for refills.  Marland Kitchen loperamide (IMODIUM A-D) 2 MG tablet Take 2 mg by mouth 4 (four) times daily as needed for diarrhea or loose stools.  . magnesium oxide (MAG-OX) 400 MG tablet Take 400 mg by mouth daily as needed (for leg cramps).   . Melatonin 10 MG CAPS Take 10 mg by mouth at bedtime.   . metoprolol tartrate (LOPRESSOR) 50 MG tablet Take 50 mg by mouth 2 (two) times daily.   Marland Kitchen MYRBETRIQ 50 MG TB24 tablet TAKE 1 TABLET(50 MG) BY MOUTH DAILY  . nystatin (NYSTATIN) powder Apply topically daily as needed.  . nystatin cream (MYCOSTATIN) Apply 1 application topically 2 (two) times daily. To skin folds  . omega-3  acid ethyl esters (LOVAZA) 1 g capsule TAKE ONE CAPSULE BY MOUTH EVERY DAY  . oxyCODONE (ROXICODONE) 5 MG immediate release tablet Take 1 tablet (5 mg total) by mouth every 4 (four) hours as needed for severe pain.  . polyvinyl alcohol (LIQUIFILM TEARS) 1.4 % ophthalmic solution Place 1 drop into both eyes 4 (four) times daily as needed (for dry/irritated eyes).   . potassium chloride (MICRO-K) 10 MEQ CR capsule Take 2 capsules (20 mEq total) by mouth 2 (two) times daily as needed (when taking a dose of lasix.).  Marland Kitchen pregabalin (LYRICA) 150 MG capsule Take 1 capsule (150 mg total) by mouth 2 (two) times daily.  Marland Kitchen tiZANidine (ZANAFLEX) 2 MG tablet Take 1 tablet (2 mg total) by mouth 3 (three) times daily.  . TOUJEO SOLOSTAR 300 UNIT/ML SOPN INJECT 100 UNITS UNDER THE SKIN EVERY NIGHT AT BEDTIME  . triamterene-hydrochlorothiazide (MAXZIDE-25) 37.5-25 MG tablet TAKE 1 TABLET BY MOUTH DAILY  . umeclidinium-vilanterol (ANORO ELLIPTA) 62.5-25 MCG/INH AEPB Inhale 1 puff into the lungs daily.  Marland Kitchen venlafaxine XR (EFFEXOR-XR) 75 MG 24 hr capsule Take one capsule by mouth once daily with breakfast  . VICTOZA 18 MG/3ML SOPN ADMINISTER 1.8 MG UNDER THE SKIN DAILY FOR BLOOD SUGAR    . [DISCONTINUED] cadexomer iodine (IODOSORB) 0.9 % gel Apply 1 application topically daily as needed for wound care.  . [DISCONTINUED] ondansetron (ZOFRAN) 4 MG tablet Take 1 tablet (4 mg total) by mouth every 8 (eight) hours as needed for nausea or vomiting.  . [DISCONTINUED] docusate sodium (COLACE) 100 MG capsule Take 1 capsule (100 mg total) by mouth 2 (two) times daily. (Patient taking differently: Take 100 mg by mouth daily as needed for mild constipation. )   No facility-administered encounter medications on file as of 08/30/2018.     Review of Systems:  Review of Systems  Constitutional: Negative for chills and fever.  Eyes: Negative for blurred vision.  Cardiovascular: Negative for chest pain, palpitations and leg swelling.  Gastrointestinal: Negative for abdominal pain.  Genitourinary: Positive for dysuria, frequency and urgency. Negative for flank pain and hematuria.  Musculoskeletal: Positive for back pain, joint pain and myalgias. Negative for falls.  Skin:       petechechial rash on both sides of her incision, has yellow green drainage from the middle of her incision which is not as well approximated (but not dehisced like last visit--external sutures all in place); no odor, no erythema of edges of wound  Psychiatric/Behavioral: Negative for depression and memory loss. The patient is not nervous/anxious.     Health Maintenance  Topic Date Due  . OPHTHALMOLOGY EXAM  12/20/1950  . FOOT EXAM  09/27/2016  . INFLUENZA VACCINE  08/22/2019 (Originally 03/22/2018)  . HEMOGLOBIN A1C  02/19/2019  . DEXA SCAN  09/20/2025  . TETANUS/TDAP  05/28/2026  . PNA vac Low Risk Adult  Completed    Physical Exam: Vitals:   08/30/18 1327  BP: 120/60  Pulse: 61  Temp: 97.8 F (36.6 C)  TempSrc: Oral  SpO2: 96%  Weight: 238 lb (108 kg)  Height: 5\' 6"  (1.676 m)   Body mass index is 38.41 kg/m. Physical Exam Constitutional:      Appearance: Normal appearance. She is obese.   Cardiovascular:     Rate and Rhythm: Normal rate and regular rhythm.     Pulses: Normal pulses.     Heart sounds: Normal heart sounds.  Pulmonary:     Effort: Pulmonary effort is normal.  Neurological:  Mental Status: She is alert.     Labs reviewed: Basic Metabolic Panel: Recent Labs    04/06/18 0930  08/09/18 1224 08/21/18 1208 08/22/18 0447  NA 141   < > 142 134* 135  K 4.8   < > 4.1 5.0 4.0  CL 103   < > 104 98 99  CO2 31   < > 30 28 28   GLUCOSE 102*   < > 45* 446* 270*  BUN 18   < > 19 26* 22  CREATININE 0.93   < > 1.00* 1.11* 0.93  CALCIUM 9.1   < > 9.8 8.8* 8.5*  PHOS  --   --   --   --  2.5  TSH 0.73  --  0.84  --   --    < > = values in this interval not displayed.   Liver Function Tests: Recent Labs    01/22/18 0941 04/06/18 0930 08/09/18 1224 08/22/18 0447  AST 26 41* 17  --   ALT 14 18 9   --   BILITOT 0.5 0.4 0.4  --   PROT 7.0 6.3 6.5  --   ALBUMIN  --   --   --  2.6*   No results for input(s): LIPASE, AMYLASE in the last 8760 hours. No results for input(s): AMMONIA in the last 8760 hours. CBC: Recent Labs    03/06/18 0941 04/06/18 0930 07/03/18 1021 08/09/18 1224 08/17/18 1817  WBC 16.6* 14.5* 14.7* 17.2* 13.3*  NEUTROABS 12,915* 10,701*  --  12,264*  --   HGB 13.7 13.2 13.2 13.9 12.9  HCT 42.1 39.7 44.2 42.9 41.8  MCV 85.1 83.8 90.0 84.8 87.1  PLT 307 346 348 430* 292   Lipid Panel: Recent Labs    04/06/18 0930 08/09/18 1224  CHOL 129 159  HDL 29* 30*  LDLCALC 75 97  TRIG 145 219*  CHOLHDL 4.4 5.3*   Lab Results  Component Value Date   HGBA1C 6.6 (H) 08/21/2018    Procedures since last visit: No results found.  Assessment/Plan 1. Postoperative wound dehiscence, sequela -much improved, still has some drainage from mid-wound where the wound is slightly wider open than the rest -no odor and does not actively appear infected at this time -incision looks much better  2. Urinary frequency -she suspects she has a UTI  and has multiple symptoms after a hospitalization -will r/o  - Urinalysis, Routine w reflex microscopic - Urine Culture -hydrate  3. Fibromyalgia -stable, not a reason for opioids--I've tried to educate her on this -cont lyrica  4. Primary osteoarthritis involving multiple joints -cont oxycodone, add tylenol if breakthrough pain not to exceed 3g per day  Labs/tests ordered:   Orders Placed This Encounter  Procedures  . Urine Culture  . Urinalysis, Routine w reflex microscopic    Next appt: 10/22/2018 med mgt   Alesandro Stueve L. Maggie Senseney, D.O. [facility] [address], [place] [address] Cell Phone (Mon-Fri 8am-5pm):  [phone] On Call:  [phone] & follow prompts after 5pm & weekends Office Phone:  [phone] Office Fax:  [phone]

## 2018-08-31 DIAGNOSIS — E1140 Type 2 diabetes mellitus with diabetic neuropathy, unspecified: Secondary | ICD-10-CM | POA: Diagnosis not present

## 2018-08-31 DIAGNOSIS — T8140XD Infection following a procedure, unspecified, subsequent encounter: Secondary | ICD-10-CM | POA: Diagnosis not present

## 2018-08-31 DIAGNOSIS — R2689 Other abnormalities of gait and mobility: Secondary | ICD-10-CM | POA: Diagnosis not present

## 2018-08-31 DIAGNOSIS — L89892 Pressure ulcer of other site, stage 2: Secondary | ICD-10-CM | POA: Diagnosis not present

## 2018-08-31 DIAGNOSIS — F331 Major depressive disorder, recurrent, moderate: Secondary | ICD-10-CM | POA: Diagnosis not present

## 2018-08-31 DIAGNOSIS — I10 Essential (primary) hypertension: Secondary | ICD-10-CM | POA: Diagnosis not present

## 2018-08-31 LAB — URINALYSIS, ROUTINE W REFLEX MICROSCOPIC
Bilirubin Urine: NEGATIVE
Glucose, UA: NEGATIVE
Hgb urine dipstick: NEGATIVE
Hyaline Cast: NONE SEEN /LPF
Ketones, ur: NEGATIVE
Nitrite: NEGATIVE
Specific Gravity, Urine: 1.013 (ref 1.001–1.03)
pH: 6.5 (ref 5.0–8.0)

## 2018-08-31 LAB — URINE CULTURE
MICRO NUMBER:: 35597
SPECIMEN QUALITY:: ADEQUATE

## 2018-09-03 DIAGNOSIS — T8140XD Infection following a procedure, unspecified, subsequent encounter: Secondary | ICD-10-CM | POA: Diagnosis not present

## 2018-09-03 DIAGNOSIS — R2689 Other abnormalities of gait and mobility: Secondary | ICD-10-CM | POA: Diagnosis not present

## 2018-09-03 DIAGNOSIS — E1140 Type 2 diabetes mellitus with diabetic neuropathy, unspecified: Secondary | ICD-10-CM | POA: Diagnosis not present

## 2018-09-03 DIAGNOSIS — L89892 Pressure ulcer of other site, stage 2: Secondary | ICD-10-CM | POA: Diagnosis not present

## 2018-09-03 DIAGNOSIS — F331 Major depressive disorder, recurrent, moderate: Secondary | ICD-10-CM | POA: Diagnosis not present

## 2018-09-03 DIAGNOSIS — I10 Essential (primary) hypertension: Secondary | ICD-10-CM | POA: Diagnosis not present

## 2018-09-04 DIAGNOSIS — I10 Essential (primary) hypertension: Secondary | ICD-10-CM | POA: Diagnosis not present

## 2018-09-04 DIAGNOSIS — F331 Major depressive disorder, recurrent, moderate: Secondary | ICD-10-CM | POA: Diagnosis not present

## 2018-09-04 DIAGNOSIS — L89892 Pressure ulcer of other site, stage 2: Secondary | ICD-10-CM | POA: Diagnosis not present

## 2018-09-04 DIAGNOSIS — E1140 Type 2 diabetes mellitus with diabetic neuropathy, unspecified: Secondary | ICD-10-CM | POA: Diagnosis not present

## 2018-09-04 DIAGNOSIS — R2689 Other abnormalities of gait and mobility: Secondary | ICD-10-CM | POA: Diagnosis not present

## 2018-09-04 DIAGNOSIS — T8140XD Infection following a procedure, unspecified, subsequent encounter: Secondary | ICD-10-CM | POA: Diagnosis not present

## 2018-09-04 NOTE — Telephone Encounter (Signed)
Noted  

## 2018-09-05 DIAGNOSIS — E1140 Type 2 diabetes mellitus with diabetic neuropathy, unspecified: Secondary | ICD-10-CM | POA: Diagnosis not present

## 2018-09-05 DIAGNOSIS — T8140XD Infection following a procedure, unspecified, subsequent encounter: Secondary | ICD-10-CM | POA: Diagnosis not present

## 2018-09-05 DIAGNOSIS — I10 Essential (primary) hypertension: Secondary | ICD-10-CM | POA: Diagnosis not present

## 2018-09-05 DIAGNOSIS — F331 Major depressive disorder, recurrent, moderate: Secondary | ICD-10-CM | POA: Diagnosis not present

## 2018-09-05 DIAGNOSIS — L89892 Pressure ulcer of other site, stage 2: Secondary | ICD-10-CM | POA: Diagnosis not present

## 2018-09-05 DIAGNOSIS — R2689 Other abnormalities of gait and mobility: Secondary | ICD-10-CM | POA: Diagnosis not present

## 2018-09-07 DIAGNOSIS — E1140 Type 2 diabetes mellitus with diabetic neuropathy, unspecified: Secondary | ICD-10-CM | POA: Diagnosis not present

## 2018-09-07 DIAGNOSIS — L89892 Pressure ulcer of other site, stage 2: Secondary | ICD-10-CM | POA: Diagnosis not present

## 2018-09-07 DIAGNOSIS — T8140XD Infection following a procedure, unspecified, subsequent encounter: Secondary | ICD-10-CM | POA: Diagnosis not present

## 2018-09-07 DIAGNOSIS — I10 Essential (primary) hypertension: Secondary | ICD-10-CM | POA: Diagnosis not present

## 2018-09-07 DIAGNOSIS — R2689 Other abnormalities of gait and mobility: Secondary | ICD-10-CM | POA: Diagnosis not present

## 2018-09-07 DIAGNOSIS — F331 Major depressive disorder, recurrent, moderate: Secondary | ICD-10-CM | POA: Diagnosis not present

## 2018-09-10 DIAGNOSIS — F331 Major depressive disorder, recurrent, moderate: Secondary | ICD-10-CM | POA: Diagnosis not present

## 2018-09-10 DIAGNOSIS — I10 Essential (primary) hypertension: Secondary | ICD-10-CM | POA: Diagnosis not present

## 2018-09-10 DIAGNOSIS — T8140XD Infection following a procedure, unspecified, subsequent encounter: Secondary | ICD-10-CM | POA: Diagnosis not present

## 2018-09-10 DIAGNOSIS — R2689 Other abnormalities of gait and mobility: Secondary | ICD-10-CM | POA: Diagnosis not present

## 2018-09-10 DIAGNOSIS — E1140 Type 2 diabetes mellitus with diabetic neuropathy, unspecified: Secondary | ICD-10-CM | POA: Diagnosis not present

## 2018-09-10 DIAGNOSIS — L89892 Pressure ulcer of other site, stage 2: Secondary | ICD-10-CM | POA: Diagnosis not present

## 2018-09-11 DIAGNOSIS — E1140 Type 2 diabetes mellitus with diabetic neuropathy, unspecified: Secondary | ICD-10-CM | POA: Diagnosis not present

## 2018-09-11 DIAGNOSIS — L89892 Pressure ulcer of other site, stage 2: Secondary | ICD-10-CM | POA: Diagnosis not present

## 2018-09-11 DIAGNOSIS — I10 Essential (primary) hypertension: Secondary | ICD-10-CM | POA: Diagnosis not present

## 2018-09-11 DIAGNOSIS — R2689 Other abnormalities of gait and mobility: Secondary | ICD-10-CM | POA: Diagnosis not present

## 2018-09-11 DIAGNOSIS — F331 Major depressive disorder, recurrent, moderate: Secondary | ICD-10-CM | POA: Diagnosis not present

## 2018-09-11 DIAGNOSIS — T8140XD Infection following a procedure, unspecified, subsequent encounter: Secondary | ICD-10-CM | POA: Diagnosis not present

## 2018-09-12 ENCOUNTER — Telehealth (INDEPENDENT_AMBULATORY_CARE_PROVIDER_SITE_OTHER): Payer: Self-pay

## 2018-09-12 ENCOUNTER — Other Ambulatory Visit: Payer: Self-pay | Admitting: Internal Medicine

## 2018-09-12 NOTE — Telephone Encounter (Signed)
Faxed completed Hyalgan application to [employer] at [phone] for VOB.

## 2018-09-13 ENCOUNTER — Ambulatory Visit (INDEPENDENT_AMBULATORY_CARE_PROVIDER_SITE_OTHER): Payer: Medicare Other | Admitting: Plastic Surgery

## 2018-09-13 ENCOUNTER — Encounter: Payer: Self-pay | Admitting: Plastic Surgery

## 2018-09-13 VITALS — BP 143/73 | HR 74 | Ht 66.0 in | Wt 238.0 lb

## 2018-09-13 DIAGNOSIS — T8130XA Disruption of wound, unspecified, initial encounter: Secondary | ICD-10-CM | POA: Diagnosis not present

## 2018-09-13 NOTE — Progress Notes (Signed)
Patient ID: Katherine Delgado, female    DOB: Apr 06, 1941, 78 y.o.   MRN: [id number]   Chief Complaint  Patient presents with  . Advice Only    for wound    The patient is a 78 yrs old wf here for consultation for a wound on her back.  She underwent a laminectomy in November and had a wound dehiscence one month later.  She was taken to the OR for debridement.  She has diabetes with poorly controlled levels although her HgA1C was ~6.  Her past medical history is positive for RA, DM, hypothyroidism, lumbar spinal stenosis, osteoporosis, depression and Sjogren's syndrome.  The incision is 13 cm long.  There is 3 cm area of open wound.  It is difficult to determine the depth.  It does not appear to be infected.  There is some drainage from the area.       Review of Systems  Constitutional: Positive for activity change. Negative for appetite change and fever.  HENT: Negative.   Eyes: Negative.   Respiratory: Negative.  Negative for chest tightness.   Cardiovascular: Negative for chest pain.  Gastrointestinal: Negative.   Genitourinary: Negative.   Musculoskeletal: Negative.   Skin: Positive for color change and wound.  Hematological: Negative.   Psychiatric/Behavioral: Negative.     Past Medical History:  Diagnosis Date  . Abnormal weight gain   . Abnormality of gait 04/19/2016  . Acute infective polyneuritis ([place]) 2002  . Allergic rhinitis due to pollen   . Chronic pain syndrome   . COPD (chronic obstructive pulmonary disease) (HCC)    chronic bronchitis  . Depressive disorder, not elsewhere classified   . Diaphragmatic hernia without mention of obstruction or gangrene   . Dyslipidemia   . Fibromyalgia   . GERD (gastroesophageal reflux disease)    with h/o esophagitis  . Guillain-Barre syndrome ([place])   . History of benign thymus tumor   . Insomnia, unspecified   . Lumbar spinal stenosis 03/30/2018   L4-5 level, severe  . Miscarriage 1962  . Mixed hyperlipidemia   . Morbid  obesity ([employer])   . Osteoporosis   . Pneumonia   . Polyneuropathy in diabetes(357.2)   . Rheumatoid arthritis(714.0)   . Spondylosis, lumbosacral   . Spontaneous ecchymoses   . Tear film insufficiency, unspecified   . Type II or unspecified type diabetes mellitus with peripheral circulatory disorders, uncontrolled(250.72)   . Unspecified chronic bronchitis ([place])   . Unspecified essential hypertension   . Unspecified hypothyroidism   . Unspecified pruritic disorder   . Unspecified urinary incontinence     Past Surgical History:  Procedure Laterality Date  . ABDOMINAL HYSTERECTOMY  1974  . abdominal tumor  2002  . APPENDECTOMY    . [place]  . BRONCHOSCOPY  2001  . CHOLECYSTECTOMY  1984  . KNEE SURGERY Bilateral 08/27/2010 (L) and 01/11/2011 (R)  . LUMBAR LAMINECTOMY/DECOMPRESSION MICRODISCECTOMY N/A 07/03/2018   Procedure: Lumbar Three to Lumbar Five Laminectomy;  Surgeon: Judith Part, MD;  Location: [place];  Service: Neurosurgery;  Laterality: N/A;  . LUMBAR WOUND DEBRIDEMENT N/A 08/20/2018   Procedure: POSTERIOR LUMBAR SPINAL WOUND DEBRIDEMENT AND REVISION;  Surgeon: Judith Part, MD;  Location: [place];  Service: Neurosurgery;  Laterality: N/A;  POSTERIOR LUMBAR SPINAL WOUND REVISION  . [place]  . OVARIAN CYST SURGERY  1968  . thymus tumor    . 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 TONSILLECTOMY  Current Outpatient Medications:  .  albuterol (PROVENTIL HFA;VENTOLIN HFA) 108 (90 Base) MCG/ACT inhaler, Inhale 2 puffs into the lungs every 6 (six) hours as needed for wheezing or shortness of breath., Disp: 3 Inhaler, Rfl: 3 .  amLODipine (NORVASC) 10 MG tablet, Take 1 tablet (10 mg total) by mouth daily. Please schedule appointment for refills., Disp: 90 tablet, Rfl: 0 .  B-D UF III MINI PEN NEEDLES 31G X 5 MM MISC, USE AS DIRECTED, Disp: 100 each, Rfl: 0 .  docusate sodium (COLACE) 100 MG capsule, Take 100 mg by mouth daily., Disp: , Rfl:  .  folic acid  (FOLVITE) 1 MG tablet, Take 2 tablets (2 mg total) by mouth daily., Disp: 180 tablet, Rfl: 4 .  furosemide (LASIX) 20 MG tablet, TAKE 1 TABLET BY MOUTH TWICE DAILY AS NEEDED FOR 3 POUND WEIGHT GAIN IN ONE DAY OR 5 POUNDS IN ONE WEEK, Disp: 60 tablet, Rfl: 0 .  HUMALOG KWIKPEN 200 UNIT/ML SOPN, INJECT 20 UNITS UNDER THE SKIN THREE TIMES DAILY BEFORE A MEAL, Disp: 36 pen, Rfl: 0 .  levothyroxine (SYNTHROID, LEVOTHROID) 137 MCG tablet, TAKE 1 TABLET BY MOUTH DAILY BEFORE BREAKFAST ON AN EMPTY STOMACH( LABS OVERDUE), Disp: 90 tablet, Rfl: 0 .  lisinopril (PRINIVIL,ZESTRIL) 20 MG tablet, Take 1 tablet (20 mg total) by mouth daily. Please schedule appointment for refills., Disp: 15 tablet, Rfl: 0 .  loperamide (IMODIUM A-D) 2 MG tablet, Take 2 mg by mouth 4 (four) times daily as needed for diarrhea or loose stools., Disp: , Rfl:  .  magnesium oxide (MAG-OX) 400 MG tablet, Take 400 mg by mouth daily as needed (for leg cramps). , Disp: , Rfl:  .  Melatonin 10 MG CAPS, Take 10 mg by mouth at bedtime. , Disp: , Rfl:  .  metoprolol tartrate (LOPRESSOR) 50 MG tablet, Take 50 mg by mouth 2 (two) times daily. , Disp: , Rfl:  .  MYRBETRIQ 50 MG TB24 tablet, TAKE 1 TABLET(50 MG) BY MOUTH DAILY, Disp: 30 tablet, Rfl: 6 .  nystatin (NYSTATIN) powder, Apply topically daily as needed., Disp: 60 g, Rfl: 1 .  nystatin cream (MYCOSTATIN), Apply 1 application topically 2 (two) times daily. To skin folds, Disp: 30 g, Rfl: 3 .  omega-3 acid ethyl esters (LOVAZA) 1 g capsule, TAKE ONE CAPSULE BY MOUTH EVERY DAY, Disp: 30 capsule, Rfl: 0 .  oxyCODONE (ROXICODONE) 5 MG immediate release tablet, Take 1 tablet (5 mg total) by mouth every 4 (four) hours as needed for severe pain., Disp: 180 tablet, Rfl: 0 .  polyvinyl alcohol (LIQUIFILM TEARS) 1.4 % ophthalmic solution, Place 1 drop into both eyes 4 (four) times daily as needed (for dry/irritated eyes). , Disp: , Rfl:  .  potassium chloride (MICRO-K) 10 MEQ CR capsule, Take 2  capsules (20 mEq total) by mouth 2 (two) times daily as needed (when taking a dose of lasix.)., Disp: 60 capsule, Rfl: 0 .  pregabalin (LYRICA) 150 MG capsule, Take 1 capsule (150 mg total) by mouth 2 (two) times daily., Disp: 60 capsule, Rfl: 3 .  tiZANidine (ZANAFLEX) 2 MG tablet, Take 1 tablet (2 mg total) by mouth 3 (three) times daily., Disp: 90 tablet, Rfl: 1 .  TOUJEO SOLOSTAR 300 UNIT/ML SOPN, INJECT 100 UNITS UNDER THE SKIN EVERY NIGHT AT BEDTIME, Disp: 63 pen, Rfl: 0 .  triamterene-hydrochlorothiazide (MAXZIDE-25) 37.5-25 MG tablet, TAKE 1 TABLET BY MOUTH DAILY, Disp: 90 tablet, Rfl: 0 .  umeclidinium-vilanterol (ANORO ELLIPTA) 62.5-25 MCG/INH AEPB, Inhale  1 puff into the lungs daily., Disp: 60 each, Rfl: 5 .  venlafaxine XR (EFFEXOR-XR) 75 MG 24 hr capsule, Take one capsule by mouth once daily with breakfast, Disp: 90 capsule, Rfl: 0 .  VICTOZA 18 MG/3ML SOPN, ADMINISTER 1.8 MG UNDER THE SKIN DAILY FOR BLOOD SUGAR, Disp: 9 mL, Rfl: 6   Objective:   Vitals:   09/13/18 1256  BP: (!) 143/73  Pulse: 74  SpO2: 97%    Physical Exam Vitals signs and nursing note reviewed.  Constitutional:      Appearance: Normal appearance.  HENT:     Head: Normocephalic and atraumatic.     Mouth/Throat:     Mouth: Mucous membranes are moist.  Eyes:     Extraocular Movements: Extraocular movements intact.  Neck:     Musculoskeletal: Normal range of motion.  Cardiovascular:     Rate and Rhythm: Normal rate.  Pulmonary:     Effort: Pulmonary effort is normal.  Musculoskeletal:       Back:  Skin:    General: Skin is warm.     Coloration: Skin is not jaundiced.  Neurological:     Mental Status: She is alert. Mental status is at baseline.  Psychiatric:        Mood and Affect: Mood normal.        Thought Content: Thought content normal.        Judgment: Judgment normal.     Assessment & Plan:  Wound dehiscence  The patient was instructed on increasing her protein, decreasing her  carbohydrates and sugar.  Excellent blood sugar control.  Multivitamin, Vit C and Zinc daily.  May shower.  Plan debridement of spine wound with acell and home vac placement.  [place], DO

## 2018-09-13 NOTE — H&P (View-Only) (Signed)
Patient ID: Katherine Delgado, female    DOB: 02-06-1941, 78 y.o.   MRN: [id number]   Chief Complaint  Patient presents with  . Advice Only    for wound    The patient is a 78 yrs old wf here for consultation for a wound on her back.  She underwent a laminectomy in November and had a wound dehiscence one month later.  She was taken to the OR for debridement.  She has diabetes with poorly controlled levels although her HgA1C was ~6.  Her past medical history is positive for RA, DM, hypothyroidism, lumbar spinal stenosis, osteoporosis, depression and Sjogren's syndrome.  The incision is 13 cm long.  There is 3 cm area of open wound.  It is difficult to determine the depth.  It does not appear to be infected.  There is some drainage from the area.       Review of Systems  Constitutional: Positive for activity change. Negative for appetite change and fever.  HENT: Negative.   Eyes: Negative.   Respiratory: Negative.  Negative for chest tightness.   Cardiovascular: Negative for chest pain.  Gastrointestinal: Negative.   Genitourinary: Negative.   Musculoskeletal: Negative.   Skin: Positive for color change and wound.  Hematological: Negative.   Psychiatric/Behavioral: Negative.     Past Medical History:  Diagnosis Date  . Abnormal weight gain   . Abnormality of gait 04/19/2016  . Acute infective polyneuritis ([place]) 2002  . Allergic rhinitis due to pollen   . Chronic pain syndrome   . COPD (chronic obstructive pulmonary disease) (HCC)    chronic bronchitis  . Depressive disorder, not elsewhere classified   . Diaphragmatic hernia without mention of obstruction or gangrene   . Dyslipidemia   . Fibromyalgia   . GERD (gastroesophageal reflux disease)    with h/o esophagitis  . Guillain-Barre syndrome ([place])   . History of benign thymus tumor   . Insomnia, unspecified   . Lumbar spinal stenosis 03/30/2018   L4-5 level, severe  . Miscarriage 1962  . Mixed hyperlipidemia   . Morbid  obesity ([place])   . Osteoporosis   . Pneumonia   . Polyneuropathy in diabetes(357.2)   . Rheumatoid arthritis(714.0)   . Spondylosis, lumbosacral   . Spontaneous ecchymoses   . Tear film insufficiency, unspecified   . Type II or unspecified type diabetes mellitus with peripheral circulatory disorders, uncontrolled(250.72)   . Unspecified chronic bronchitis ([place])   . Unspecified essential hypertension   . Unspecified hypothyroidism   . Unspecified pruritic disorder   . Unspecified urinary incontinence     Past Surgical History:  Procedure Laterality Date  . ABDOMINAL HYSTERECTOMY  1974  . abdominal tumor  2002  . APPENDECTOMY    . [place]  . BRONCHOSCOPY  2001  . CHOLECYSTECTOMY  1984  . KNEE SURGERY Bilateral 08/27/2010 (L) and 01/11/2011 (R)  . LUMBAR LAMINECTOMY/DECOMPRESSION MICRODISCECTOMY N/A 07/03/2018   Procedure: Lumbar Three to Lumbar Five Laminectomy;  Surgeon: Judith Part, MD;  Location: [facility];  Service: Neurosurgery;  Laterality: N/A;  . LUMBAR WOUND DEBRIDEMENT N/A 08/20/2018   Procedure: POSTERIOR LUMBAR SPINAL WOUND DEBRIDEMENT AND REVISION;  Surgeon: Judith Part, MD;  Location: [facility];  Service: Neurosurgery;  Laterality: N/A;  POSTERIOR LUMBAR SPINAL WOUND REVISION  . [place]  . OVARIAN CYST SURGERY  1968  . thymus tumor    . 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 TONSILLECTOMY  Current Outpatient Medications:  .  albuterol (PROVENTIL HFA;VENTOLIN HFA) 108 (90 Base) MCG/ACT inhaler, Inhale 2 puffs into the lungs every 6 (six) hours as needed for wheezing or shortness of breath., Disp: 3 Inhaler, Rfl: 3 .  amLODipine (NORVASC) 10 MG tablet, Take 1 tablet (10 mg total) by mouth daily. Please schedule appointment for refills., Disp: 90 tablet, Rfl: 0 .  B-D UF III MINI PEN NEEDLES 31G X 5 MM MISC, USE AS DIRECTED, Disp: 100 each, Rfl: 0 .  docusate sodium (COLACE) 100 MG capsule, Take 100 mg by mouth daily., Disp: , Rfl:  .  folic acid  (FOLVITE) 1 MG tablet, Take 2 tablets (2 mg total) by mouth daily., Disp: 180 tablet, Rfl: 4 .  furosemide (LASIX) 20 MG tablet, TAKE 1 TABLET BY MOUTH TWICE DAILY AS NEEDED FOR 3 POUND WEIGHT GAIN IN ONE DAY OR 5 POUNDS IN ONE WEEK, Disp: 60 tablet, Rfl: 0 .  HUMALOG KWIKPEN 200 UNIT/ML SOPN, INJECT 20 UNITS UNDER THE SKIN THREE TIMES DAILY BEFORE A MEAL, Disp: 36 pen, Rfl: 0 .  levothyroxine (SYNTHROID, LEVOTHROID) 137 MCG tablet, TAKE 1 TABLET BY MOUTH DAILY BEFORE BREAKFAST ON AN EMPTY STOMACH( LABS OVERDUE), Disp: 90 tablet, Rfl: 0 .  lisinopril (PRINIVIL,ZESTRIL) 20 MG tablet, Take 1 tablet (20 mg total) by mouth daily. Please schedule appointment for refills., Disp: 15 tablet, Rfl: 0 .  loperamide (IMODIUM A-D) 2 MG tablet, Take 2 mg by mouth 4 (four) times daily as needed for diarrhea or loose stools., Disp: , Rfl:  .  magnesium oxide (MAG-OX) 400 MG tablet, Take 400 mg by mouth daily as needed (for leg cramps). , Disp: , Rfl:  .  Melatonin 10 MG CAPS, Take 10 mg by mouth at bedtime. , Disp: , Rfl:  .  metoprolol tartrate (LOPRESSOR) 50 MG tablet, Take 50 mg by mouth 2 (two) times daily. , Disp: , Rfl:  .  MYRBETRIQ 50 MG TB24 tablet, TAKE 1 TABLET(50 MG) BY MOUTH DAILY, Disp: 30 tablet, Rfl: 6 .  nystatin (NYSTATIN) powder, Apply topically daily as needed., Disp: 60 g, Rfl: 1 .  nystatin cream (MYCOSTATIN), Apply 1 application topically 2 (two) times daily. To skin folds, Disp: 30 g, Rfl: 3 .  omega-3 acid ethyl esters (LOVAZA) 1 g capsule, TAKE ONE CAPSULE BY MOUTH EVERY DAY, Disp: 30 capsule, Rfl: 0 .  oxyCODONE (ROXICODONE) 5 MG immediate release tablet, Take 1 tablet (5 mg total) by mouth every 4 (four) hours as needed for severe pain., Disp: 180 tablet, Rfl: 0 .  polyvinyl alcohol (LIQUIFILM TEARS) 1.4 % ophthalmic solution, Place 1 drop into both eyes 4 (four) times daily as needed (for dry/irritated eyes). , Disp: , Rfl:  .  potassium chloride (MICRO-K) 10 MEQ CR capsule, Take 2  capsules (20 mEq total) by mouth 2 (two) times daily as needed (when taking a dose of lasix.)., Disp: 60 capsule, Rfl: 0 .  pregabalin (LYRICA) 150 MG capsule, Take 1 capsule (150 mg total) by mouth 2 (two) times daily., Disp: 60 capsule, Rfl: 3 .  tiZANidine (ZANAFLEX) 2 MG tablet, Take 1 tablet (2 mg total) by mouth 3 (three) times daily., Disp: 90 tablet, Rfl: 1 .  TOUJEO SOLOSTAR 300 UNIT/ML SOPN, INJECT 100 UNITS UNDER THE SKIN EVERY NIGHT AT BEDTIME, Disp: 63 pen, Rfl: 0 .  triamterene-hydrochlorothiazide (MAXZIDE-25) 37.5-25 MG tablet, TAKE 1 TABLET BY MOUTH DAILY, Disp: 90 tablet, Rfl: 0 .  umeclidinium-vilanterol (ANORO ELLIPTA) 62.5-25 MCG/INH AEPB, Inhale  1 puff into the lungs daily., Disp: 60 each, Rfl: 5 .  venlafaxine XR (EFFEXOR-XR) 75 MG 24 hr capsule, Take one capsule by mouth once daily with breakfast, Disp: 90 capsule, Rfl: 0 .  VICTOZA 18 MG/3ML SOPN, ADMINISTER 1.8 MG UNDER THE SKIN DAILY FOR BLOOD SUGAR, Disp: 9 mL, Rfl: 6   Objective:   Vitals:   09/13/18 1256  BP: (!) 143/73  Pulse: 74  SpO2: 97%    Physical Exam Vitals signs and nursing note reviewed.  Constitutional:      Appearance: Normal appearance.  HENT:     Head: Normocephalic and atraumatic.     Mouth/Throat:     Mouth: Mucous membranes are moist.  Eyes:     Extraocular Movements: Extraocular movements intact.  Neck:     Musculoskeletal: Normal range of motion.  Cardiovascular:     Rate and Rhythm: Normal rate.  Pulmonary:     Effort: Pulmonary effort is normal.  Musculoskeletal:       Back:  Skin:    General: Skin is warm.     Coloration: Skin is not jaundiced.  Neurological:     Mental Status: She is alert. Mental status is at baseline.  Psychiatric:        Mood and Affect: Mood normal.        Thought Content: Thought content normal.        Judgment: Judgment normal.     Assessment & Plan:  Wound dehiscence  The patient was instructed on increasing her protein, decreasing her  carbohydrates and sugar.  Excellent blood sugar control.  Multivitamin, Vit C and Zinc daily.  May shower.  Plan debridement of spine wound with acell and home vac placement.  [place], DO

## 2018-09-14 DIAGNOSIS — R2689 Other abnormalities of gait and mobility: Secondary | ICD-10-CM | POA: Diagnosis not present

## 2018-09-14 DIAGNOSIS — L89892 Pressure ulcer of other site, stage 2: Secondary | ICD-10-CM | POA: Diagnosis not present

## 2018-09-14 DIAGNOSIS — E1140 Type 2 diabetes mellitus with diabetic neuropathy, unspecified: Secondary | ICD-10-CM | POA: Diagnosis not present

## 2018-09-14 DIAGNOSIS — F331 Major depressive disorder, recurrent, moderate: Secondary | ICD-10-CM | POA: Diagnosis not present

## 2018-09-14 DIAGNOSIS — I10 Essential (primary) hypertension: Secondary | ICD-10-CM | POA: Diagnosis not present

## 2018-09-14 DIAGNOSIS — T8140XD Infection following a procedure, unspecified, subsequent encounter: Secondary | ICD-10-CM | POA: Diagnosis not present

## 2018-09-14 MED ORDER — ZINC SULFATE 220 (50 ZN) MG PO CAPS: 220.0000 mg | ORAL_CAPSULE | Freq: Every day | ORAL | 0 refills | Status: AC

## 2018-09-14 NOTE — Addendum Note (Signed)
Addended by: Wallace Going on: 09/14/2018 01:55 PM   Modules accepted: Orders

## 2018-09-17 DIAGNOSIS — L89892 Pressure ulcer of other site, stage 2: Secondary | ICD-10-CM | POA: Diagnosis not present

## 2018-09-17 DIAGNOSIS — F331 Major depressive disorder, recurrent, moderate: Secondary | ICD-10-CM | POA: Diagnosis not present

## 2018-09-17 DIAGNOSIS — T8140XD Infection following a procedure, unspecified, subsequent encounter: Secondary | ICD-10-CM | POA: Diagnosis not present

## 2018-09-17 DIAGNOSIS — I10 Essential (primary) hypertension: Secondary | ICD-10-CM | POA: Diagnosis not present

## 2018-09-17 DIAGNOSIS — R2689 Other abnormalities of gait and mobility: Secondary | ICD-10-CM | POA: Diagnosis not present

## 2018-09-17 DIAGNOSIS — E1140 Type 2 diabetes mellitus with diabetic neuropathy, unspecified: Secondary | ICD-10-CM | POA: Diagnosis not present

## 2018-09-18 DIAGNOSIS — T8140XD Infection following a procedure, unspecified, subsequent encounter: Secondary | ICD-10-CM | POA: Diagnosis not present

## 2018-09-18 DIAGNOSIS — E1140 Type 2 diabetes mellitus with diabetic neuropathy, unspecified: Secondary | ICD-10-CM | POA: Diagnosis not present

## 2018-09-18 DIAGNOSIS — L89892 Pressure ulcer of other site, stage 2: Secondary | ICD-10-CM | POA: Diagnosis not present

## 2018-09-18 DIAGNOSIS — F331 Major depressive disorder, recurrent, moderate: Secondary | ICD-10-CM | POA: Diagnosis not present

## 2018-09-18 DIAGNOSIS — R2689 Other abnormalities of gait and mobility: Secondary | ICD-10-CM | POA: Diagnosis not present

## 2018-09-18 DIAGNOSIS — I10 Essential (primary) hypertension: Secondary | ICD-10-CM | POA: Diagnosis not present

## 2018-09-19 DIAGNOSIS — E1140 Type 2 diabetes mellitus with diabetic neuropathy, unspecified: Secondary | ICD-10-CM | POA: Diagnosis not present

## 2018-09-19 DIAGNOSIS — L89892 Pressure ulcer of other site, stage 2: Secondary | ICD-10-CM | POA: Diagnosis not present

## 2018-09-19 DIAGNOSIS — R2689 Other abnormalities of gait and mobility: Secondary | ICD-10-CM | POA: Diagnosis not present

## 2018-09-19 DIAGNOSIS — F331 Major depressive disorder, recurrent, moderate: Secondary | ICD-10-CM | POA: Diagnosis not present

## 2018-09-19 DIAGNOSIS — T8140XD Infection following a procedure, unspecified, subsequent encounter: Secondary | ICD-10-CM | POA: Diagnosis not present

## 2018-09-19 DIAGNOSIS — I10 Essential (primary) hypertension: Secondary | ICD-10-CM | POA: Diagnosis not present

## 2018-09-20 NOTE — Progress Notes (Addendum)
Pt's past medical history, recent hospitalization, medications, and labs reviewed with Dr Ambrose Pancoast. Pt's surgery on 09/26/18 with Dr. Marla Roe will need to be done at the [facility]. Pt is not a candidate for outpatient surgery. LVM with Michaela at Dr Dillingham's office to let them know above information.

## 2018-09-22 ENCOUNTER — Other Ambulatory Visit: Payer: Self-pay | Admitting: Internal Medicine

## 2018-09-25 ENCOUNTER — Encounter (HOSPITAL_COMMUNITY): Payer: Self-pay | Admitting: *Deleted

## 2018-09-25 ENCOUNTER — Other Ambulatory Visit: Payer: Self-pay

## 2018-09-25 ENCOUNTER — Other Ambulatory Visit: Payer: Self-pay | Admitting: *Deleted

## 2018-09-25 DIAGNOSIS — M5441 Lumbago with sciatica, right side: Principal | ICD-10-CM

## 2018-09-25 DIAGNOSIS — G8929 Other chronic pain: Secondary | ICD-10-CM

## 2018-09-25 MED ORDER — OXYCODONE HCL 5 MG PO TABS: 5.0000 mg | ORAL_TABLET | Freq: Four times a day (QID) | ORAL | 0 refills | Status: DC | PRN

## 2018-09-25 NOTE — Progress Notes (Signed)
Spoke with pt for pre-op call. Pt denies cardiac history, states she saw Dr. Stanford Breed in 2018 for shortness of breath. States he did heart studies on her and said that they looked ok. She states he told her to follow up with as needed. Echo and Stress test done at that time. She denies any recent chest pain or sob. Pt is a type 2 diabetic. Last A1C was 6.6 on 08/21/18. She states her fasting blood sugars are usually in the 120's-130's. Instructed pt to take 1/2 of her regular dose of Toujeo Insulin this evening. She will take 50 units. Instructed pt not to take her Novolog insulin in the AM. Instructed pt to check her blood sugar when she gets up and every 2 hours until she leaves for the hospital. If blood sugar is 70 or below, treat with 1/2 cup of clear juice (apple or cranberry) and recheck blood sugar 15 minutes after drinking juice. If blood sugar continues to be 70 or below, call the Short Stay department and ask to speak to a nurse. Pt voiced understanding. Pt states no one has instructed her to stop or not stop her Aspirin. I have called Dr. Eusebio Friendly office to ask if pt needs to hold today's dose. Waiting for a return call.

## 2018-09-25 NOTE — Telephone Encounter (Signed)
Patient called requesting her pain medication to be filled a couple days early due to Having her 3rd back surgery tomorrow.   [place] Verified LR: 08/28/2018 Pended Rx and sent to Dr. Mariea Clonts for approval.

## 2018-09-26 ENCOUNTER — Ambulatory Visit (HOSPITAL_COMMUNITY): Payer: Medicare Other | Admitting: Certified Registered Nurse Anesthetist

## 2018-09-26 ENCOUNTER — Ambulatory Visit (HOSPITAL_COMMUNITY)
Admission: RE | Admit: 2018-09-26 | Discharge: 2018-09-26 | Disposition: A | Discharge status: Home / Self Care | Payer: Medicare Other | Attending: Plastic Surgery | Admitting: Plastic Surgery

## 2018-09-26 ENCOUNTER — Other Ambulatory Visit: Payer: Self-pay

## 2018-09-26 ENCOUNTER — Encounter (HOSPITAL_COMMUNITY): Payer: Self-pay

## 2018-09-26 ENCOUNTER — Encounter (HOSPITAL_COMMUNITY)
Admission: RE | Disposition: A | Discharge status: Home / Self Care | Payer: Self-pay | Source: Home / Self Care | Attending: Plastic Surgery

## 2018-09-26 DIAGNOSIS — J449 Chronic obstructive pulmonary disease, unspecified: Secondary | ICD-10-CM | POA: Insufficient documentation

## 2018-09-26 DIAGNOSIS — Z87891 Personal history of nicotine dependence: Secondary | ICD-10-CM | POA: Diagnosis not present

## 2018-09-26 DIAGNOSIS — M069 Rheumatoid arthritis, unspecified: Secondary | ICD-10-CM | POA: Insufficient documentation

## 2018-09-26 DIAGNOSIS — Z888 Allergy status to other drugs, medicaments and biological substances status: Secondary | ICD-10-CM | POA: Diagnosis not present

## 2018-09-26 DIAGNOSIS — I10 Essential (primary) hypertension: Secondary | ICD-10-CM | POA: Insufficient documentation

## 2018-09-26 DIAGNOSIS — E039 Hypothyroidism, unspecified: Secondary | ICD-10-CM | POA: Diagnosis not present

## 2018-09-26 DIAGNOSIS — Z882 Allergy status to sulfonamides status: Secondary | ICD-10-CM | POA: Diagnosis not present

## 2018-09-26 DIAGNOSIS — M3500 Sicca syndrome, unspecified: Secondary | ICD-10-CM | POA: Insufficient documentation

## 2018-09-26 DIAGNOSIS — E1142 Type 2 diabetes mellitus with diabetic polyneuropathy: Secondary | ICD-10-CM | POA: Insufficient documentation

## 2018-09-26 DIAGNOSIS — Z6838 Body mass index (BMI) 38.0-38.9, adult: Secondary | ICD-10-CM | POA: Diagnosis not present

## 2018-09-26 DIAGNOSIS — Z79890 Hormone replacement therapy (postmenopausal): Secondary | ICD-10-CM | POA: Diagnosis not present

## 2018-09-26 DIAGNOSIS — M797 Fibromyalgia: Secondary | ICD-10-CM | POA: Insufficient documentation

## 2018-09-26 DIAGNOSIS — Y838 Other surgical procedures as the cause of abnormal reaction of the patient, or of later complication, without mention of misadventure at the time of the procedure: Secondary | ICD-10-CM | POA: Insufficient documentation

## 2018-09-26 DIAGNOSIS — E785 Hyperlipidemia, unspecified: Secondary | ICD-10-CM | POA: Insufficient documentation

## 2018-09-26 DIAGNOSIS — R32 Unspecified urinary incontinence: Secondary | ICD-10-CM | POA: Insufficient documentation

## 2018-09-26 DIAGNOSIS — T8189XA Other complications of procedures, not elsewhere classified, initial encounter: Secondary | ICD-10-CM | POA: Diagnosis not present

## 2018-09-26 DIAGNOSIS — Z880 Allergy status to penicillin: Secondary | ICD-10-CM | POA: Insufficient documentation

## 2018-09-26 DIAGNOSIS — Z79899 Other long term (current) drug therapy: Secondary | ICD-10-CM | POA: Diagnosis not present

## 2018-09-26 DIAGNOSIS — S31000A Unspecified open wound of lower back and pelvis without penetration into retroperitoneum, initial encounter: Secondary | ICD-10-CM | POA: Diagnosis not present

## 2018-09-26 DIAGNOSIS — E1151 Type 2 diabetes mellitus with diabetic peripheral angiopathy without gangrene: Secondary | ICD-10-CM | POA: Insufficient documentation

## 2018-09-26 DIAGNOSIS — Z794 Long term (current) use of insulin: Secondary | ICD-10-CM | POA: Diagnosis not present

## 2018-09-26 HISTORY — PX: APPLICATION OF A-CELL OF BACK: SHX6301

## 2018-09-26 HISTORY — PX: INCISION AND DRAINAGE OF WOUND: SHX1803

## 2018-09-26 HISTORY — PX: APPLICATION OF WOUND VAC: SHX5189

## 2018-09-26 LAB — BASIC METABOLIC PANEL
ANION GAP: 15 (ref 5–15)
BUN: 22 mg/dL (ref 8–23)
CO2: 24 mmol/L (ref 22–32)
Calcium: 9.8 mg/dL (ref 8.9–10.3)
Chloride: 102 mmol/L (ref 98–111)
Creatinine, Ser: 0.94 mg/dL (ref 0.44–1.00)
GFR calc Af Amer: >60 mL/min (ref >60)
GFR calc non Af Amer: 59 mL/min — ABNORMAL LOW (ref >60)
Glucose, Bld: 153 mg/dL — ABNORMAL HIGH (ref 70–99)
Potassium: 3.7 mmol/L (ref 3.5–5.1)
Sodium: 141 mmol/L (ref 135–145)

## 2018-09-26 LAB — CBC
HCT: 45.6 % (ref 36.0–46.0)
Hemoglobin: 13.9 g/dL (ref 12.0–15.0)
MCH: 26.1 pg (ref 26.0–34.0)
MCHC: 30.5 g/dL (ref 30.0–36.0)
MCV: 85.6 fL (ref 80.0–100.0)
PLATELETS: 387 10*3/uL (ref 150–400)
RBC: 5.33 MIL/uL — ABNORMAL HIGH (ref 3.87–5.11)
RDW: 13 % (ref 11.5–15.5)
WBC: 16.8 10*3/uL — ABNORMAL HIGH (ref 4.0–10.5)
nRBC: 0 % (ref 0.0–0.2)

## 2018-09-26 LAB — GLUCOSE, CAPILLARY
Glucose-Capillary: 146 mg/dL — ABNORMAL HIGH (ref 70–99)
Glucose-Capillary: 132 mg/dL — ABNORMAL HIGH (ref 70–99)

## 2018-09-26 SURGERY — IRRIGATION AND DEBRIDEMENT WOUND
Anesthesia: General | Site: Back

## 2018-09-26 MED ORDER — LIDOCAINE 2% (20 MG/ML) 5 ML SYRINGE
INTRAMUSCULAR | Status: DC | PRN
  Administered 2018-09-26: 100 mg via INTRAVENOUS

## 2018-09-26 MED ORDER — FENTANYL CITRATE (PF) 250 MCG/5ML IJ SOLN
INTRAMUSCULAR | Status: AC
  Filled 2018-09-26: qty 5

## 2018-09-26 MED ORDER — ONDANSETRON HCL 4 MG/2ML IJ SOLN
INTRAMUSCULAR | Status: AC
  Filled 2018-09-26: qty 2

## 2018-09-26 MED ORDER — LIDOCAINE 2% (20 MG/ML) 5 ML SYRINGE
INTRAMUSCULAR | Status: AC
  Filled 2018-09-26: qty 5

## 2018-09-26 MED ORDER — 0.9 % SODIUM CHLORIDE (POUR BTL) OPTIME
TOPICAL | Status: DC | PRN
  Administered 2018-09-26: 1000 mL

## 2018-09-26 MED ORDER — ACETAMINOPHEN 325 MG PO TABS: 650.0000 mg | ORAL_TABLET | ORAL | Status: DC | PRN

## 2018-09-26 MED ORDER — ROCURONIUM BROMIDE 10 MG/ML (PF) SYRINGE
PREFILLED_SYRINGE | INTRAVENOUS | Status: DC | PRN
  Administered 2018-09-26: 50 mg via INTRAVENOUS

## 2018-09-26 MED ORDER — PROPOFOL 10 MG/ML IV BOLUS
INTRAVENOUS | Status: DC | PRN
  Administered 2018-09-26: 100 mg via INTRAVENOUS

## 2018-09-26 MED ORDER — FENTANYL CITRATE (PF) 100 MCG/2ML IJ SOLN
25.0000 ug | INTRAMUSCULAR | Status: DC | PRN
  Administered 2018-09-26: 25 ug via INTRAVENOUS

## 2018-09-26 MED ORDER — SODIUM CHLORIDE 0.9% FLUSH: 3.0000 mL | INTRAVENOUS | Status: DC | PRN

## 2018-09-26 MED ORDER — SUGAMMADEX SODIUM 200 MG/2ML IV SOLN
INTRAVENOUS | Status: DC | PRN
  Administered 2018-09-26: 216 mg via INTRAVENOUS

## 2018-09-26 MED ORDER — SODIUM CHLORIDE 0.9% FLUSH: 3.0000 mL | Freq: Two times a day (BID) | INTRAVENOUS | Status: DC

## 2018-09-26 MED ORDER — SODIUM CHLORIDE 0.9 % IV SOLN: 250.0000 mL | INTRAVENOUS | Status: DC | PRN

## 2018-09-26 MED ORDER — CIPROFLOXACIN IN D5W 400 MG/200ML IV SOLN
400.0000 mg | INTRAVENOUS | Status: AC
  Administered 2018-09-26: 400 mg via INTRAVENOUS
  Filled 2018-09-26: qty 200

## 2018-09-26 MED ORDER — ROCURONIUM BROMIDE 50 MG/5ML IV SOSY
PREFILLED_SYRINGE | INTRAVENOUS | Status: AC
  Filled 2018-09-26: qty 5

## 2018-09-26 MED ORDER — PHENYLEPHRINE 40 MCG/ML (10ML) SYRINGE FOR IV PUSH (FOR BLOOD PRESSURE SUPPORT)
PREFILLED_SYRINGE | INTRAVENOUS | Status: AC
  Filled 2018-09-26: qty 10

## 2018-09-26 MED ORDER — PROPOFOL 10 MG/ML IV BOLUS
INTRAVENOUS | Status: AC
  Filled 2018-09-26: qty 40

## 2018-09-26 MED ORDER — FENTANYL CITRATE (PF) 100 MCG/2ML IJ SOLN
INTRAMUSCULAR | Status: AC
  Filled 2018-09-26: qty 2

## 2018-09-26 MED ORDER — LIDOCAINE-EPINEPHRINE 1 %-1:100000 IJ SOLN
INTRAMUSCULAR | Status: AC
  Filled 2018-09-26: qty 1

## 2018-09-26 MED ORDER — ACETAMINOPHEN 650 MG RE SUPP: 650.0000 mg | RECTAL | Status: DC | PRN

## 2018-09-26 MED ORDER — OXYCODONE HCL 5 MG PO TABS: 5.0000 mg | ORAL_TABLET | ORAL | Status: DC | PRN

## 2018-09-26 MED ORDER — ONDANSETRON HCL 4 MG/2ML IJ SOLN
INTRAMUSCULAR | Status: DC | PRN
  Administered 2018-09-26: 4 mg via INTRAVENOUS

## 2018-09-26 MED ORDER — FENTANYL CITRATE (PF) 250 MCG/5ML IJ SOLN
INTRAMUSCULAR | Status: DC | PRN
  Administered 2018-09-26: 100 ug via INTRAVENOUS

## 2018-09-26 MED ORDER — LACTATED RINGERS IV SOLN
INTRAVENOUS | Status: DC
  Administered 2018-09-26: 10:00:00 via INTRAVENOUS

## 2018-09-26 MED ORDER — LIDOCAINE-EPINEPHRINE 1 %-1:100000 IJ SOLN
INTRAMUSCULAR | Status: DC | PRN
  Administered 2018-09-26: 10 mL

## 2018-09-26 MED ORDER — SODIUM CHLORIDE 0.9 % IV SOLN
INTRAVENOUS | Status: DC | PRN
  Administered 2018-09-26: 500 mL

## 2018-09-26 MED ORDER — PHENYLEPHRINE 40 MCG/ML (10ML) SYRINGE FOR IV PUSH (FOR BLOOD PRESSURE SUPPORT)
PREFILLED_SYRINGE | INTRAVENOUS | Status: DC | PRN
  Administered 2018-09-26: 80 ug via INTRAVENOUS

## 2018-09-26 SURGICAL SUPPLY — 59 items
APL SKNCLS STERI-STRIP NONHPOA (GAUZE/BANDAGES/DRESSINGS)
APPLICATOR COTTON TIP 6IN STRL (MISCELLANEOUS) ×1 IMPLANT
BAG DECANTER FOR FLEXI CONT (MISCELLANEOUS) ×1 IMPLANT
BENZOIN TINCTURE PRP APPL 2/3 (GAUZE/BANDAGES/DRESSINGS) ×1 IMPLANT
CANISTER SUCT 3000ML PPV (MISCELLANEOUS) ×2 IMPLANT
CONT SPEC 4OZ CLIKSEAL STRL BL (MISCELLANEOUS) ×1 IMPLANT
COVER SURGICAL LIGHT HANDLE (MISCELLANEOUS) ×1 IMPLANT
COVER WAND RF STERILE (DRAPES) ×1 IMPLANT
DRAPE HALF SHEET 40X57 (DRAPES) IMPLANT
DRAPE IMP U-DRAPE 54X76 (DRAPES) ×2 IMPLANT
DRAPE INCISE IOBAN 66X45 STRL (DRAPES) ×1 IMPLANT
DRAPE LAPAROSCOPIC ABDOMINAL (DRAPES) IMPLANT
DRAPE LAPAROTOMY 100X72 PEDS (DRAPES) ×1 IMPLANT
DRESSING HYDROCOLLOID 4X4 (GAUZE/BANDAGES/DRESSINGS) ×3 IMPLANT
DRSG ADAPTIC 3X8 NADH LF (GAUZE/BANDAGES/DRESSINGS) IMPLANT
DRSG CUTIMED SORBACT 7X9 (GAUZE/BANDAGES/DRESSINGS) ×1 IMPLANT
DRSG PAD ABDOMINAL 8X10 ST (GAUZE/BANDAGES/DRESSINGS) IMPLANT
DRSG VAC ATS LRG SENSATRAC (GAUZE/BANDAGES/DRESSINGS) IMPLANT
DRSG VAC ATS MED SENSATRAC (GAUZE/BANDAGES/DRESSINGS) IMPLANT
DRSG VAC ATS SM SENSATRAC (GAUZE/BANDAGES/DRESSINGS) ×1 IMPLANT
ELECT CAUTERY BLADE 6.4 (BLADE) ×1 IMPLANT
ELECT REM PT RETURN 9FT ADLT (ELECTROSURGICAL) ×2
ELECTRODE REM PT RTRN 9FT ADLT (ELECTROSURGICAL) ×1 IMPLANT
GAUZE SPONGE 4X4 12PLY STRL (GAUZE/BANDAGES/DRESSINGS) IMPLANT
GLOVE BIO SURGEON STRL SZ 6.5 (GLOVE) ×3 IMPLANT
GLOVE BIOGEL PI IND STRL 7.0 (GLOVE) IMPLANT
GLOVE BIOGEL PI IND STRL 7.5 (GLOVE) IMPLANT
GLOVE BIOGEL PI INDICATOR 7.0 (GLOVE) ×1
GLOVE BIOGEL PI INDICATOR 7.5 (GLOVE) ×1
GLOVE ECLIPSE 8.0 STRL XLNG CF (GLOVE) ×1 IMPLANT
GOWN STRL REUS W/ TWL LRG LVL3 (GOWN DISPOSABLE) ×3 IMPLANT
GOWN STRL REUS W/ TWL XL LVL3 (GOWN DISPOSABLE) IMPLANT
GOWN STRL REUS W/TWL LRG LVL3 (GOWN DISPOSABLE) ×4
GOWN STRL REUS W/TWL XL LVL3 (GOWN DISPOSABLE) ×2
KIT BASIN OR (CUSTOM PROCEDURE TRAY) ×2 IMPLANT
KIT SUTURE REMOVAL HAMOT (SET/KITS/TRAYS/PACK) IMPLANT
KIT TURNOVER KIT B (KITS) ×2 IMPLANT
MARKER SKIN DUAL TIP RULER LAB (MISCELLANEOUS) ×1 IMPLANT
MATRIX WOUND 3-LAYER 7X10 (Tissue) ×1 IMPLANT
MICROMATRIX 1000MG (Tissue) ×2 IMPLANT
NDL HYPO 25GX1X1/2 BEV (NEEDLE) ×1 IMPLANT
NEEDLE HYPO 25GX1X1/2 BEV (NEEDLE) ×2 IMPLANT
NS IRRIG 1000ML POUR BTL (IV SOLUTION) ×2 IMPLANT
PACK GENERAL/GYN (CUSTOM PROCEDURE TRAY) ×2 IMPLANT
PACK UNIVERSAL I (CUSTOM PROCEDURE TRAY) ×2 IMPLANT
PAD ARMBOARD 7.5X6 YLW CONV (MISCELLANEOUS) ×7 IMPLANT
SOLUTION PARTIC MCRMTRX 1000MG (Tissue) IMPLANT
STAPLER VISISTAT 35W (STAPLE) ×1 IMPLANT
SURGILUBE 2OZ TUBE FLIPTOP (MISCELLANEOUS) ×1 IMPLANT
SUT MNCRL AB 3-0 PS2 18 (SUTURE) ×1 IMPLANT
SUT MNCRL AB 4-0 PS2 18 (SUTURE) ×1 IMPLANT
SUT SILK 4 0 PS 2 (SUTURE) ×1 IMPLANT
SUT VIC AB 5-0 PS2 18 (SUTURE) ×2 IMPLANT
SWAB COLLECTION DEVICE MRSA (MISCELLANEOUS) ×1 IMPLANT
SWAB CULTURE ESWAB REG 1ML (MISCELLANEOUS) IMPLANT
SYR CONTROL 10ML LL (SYRINGE) ×2 IMPLANT
TOWEL OR 17X26 10 PK STRL BLUE (TOWEL DISPOSABLE) ×2 IMPLANT
UNDERPAD 30X30 (UNDERPADS AND DIAPERS) ×1 IMPLANT
WND VAC CANISTER 500ML (MISCELLANEOUS) ×1 IMPLANT

## 2018-09-26 NOTE — Transfer of Care (Signed)
Immediate Anesthesia Transfer of Care Note  Patient: Katherine Delgado  Procedure(s) Performed: Debridement of spine wound (N/A Back) With Acell (N/A Back) Vac placement (N/A Back)  Patient Location: PACU  Anesthesia Type:General  Level of Consciousness: awake, alert  and oriented  Airway & Oxygen Therapy: Patient Spontanous Breathing and Patient connected to nasal cannula oxygen  Post-op Assessment: Report given to RN and Post -op Vital signs reviewed and stable  Post vital signs: Reviewed and stable  Last Vitals:  Vitals Value Taken Time  BP 160/73 09/26/2018  2:05 PM  Temp    Pulse 92 09/26/2018  2:07 PM  Resp 16 09/26/2018  2:07 PM  SpO2 97 % 09/26/2018  2:07 PM  Vitals shown include unvalidated device data.  Last Pain:  Vitals:   09/26/18 1000  TempSrc: Oral  PainSc: 4       Patients Stated Pain Goal: 2 (43/88/87 5797)  Complications: No apparent anesthesia complications

## 2018-09-26 NOTE — Anesthesia Preprocedure Evaluation (Addendum)
Anesthesia Evaluation  Patient identified by MRN, date of birth, ID band Patient awake    Reviewed: Allergy & Precautions, NPO status , Patient's Chart, lab work & pertinent test results, reviewed documented beta blocker date and time   Airway Mallampati: I  TM Distance: >3 FB Neck ROM: Full    Dental no notable dental hx. (+) Poor Dentition, Dental Advisory Given   Pulmonary shortness of breath, COPD, former smoker,    Pulmonary exam normal breath sounds clear to auscultation       Cardiovascular hypertension, Pt. on medications and Pt. on home beta blockers negative cardio ROS Normal cardiovascular exam Rhythm:Regular Rate:Normal     Neuro/Psych  Headaches, PSYCHIATRIC DISORDERS Depression    GI/Hepatic negative GI ROS, Neg liver ROS,   Endo/Other  diabetes, Type 2, Insulin DependentHypothyroidism   Renal/GU negative Renal ROS  negative genitourinary   Musculoskeletal  (+) Arthritis , Fibromyalgia -  Abdominal   Peds  Hematology negative hematology ROS (+)   Anesthesia Other Findings   Reproductive/Obstetrics                            Anesthesia Physical Anesthesia Plan  ASA: III  Anesthesia Plan: General   Post-op Pain Management:    Induction: Intravenous  PONV Risk Score and Plan: 3 and Treatment may vary due to age or medical condition, Dexamethasone and Ondansetron  Airway Management Planned: Oral ETT  Additional Equipment:   Intra-op Plan:   Post-operative Plan: Extubation in OR  Informed Consent: I have reviewed the patients History and Physical, chart, labs and discussed the procedure including the risks, benefits and alternatives for the proposed anesthesia with the patient or authorized representative who has indicated his/her understanding and acceptance.     Dental advisory given  Plan Discussed with: CRNA  Anesthesia Plan Comments:          Anesthesia Quick Evaluation

## 2018-09-26 NOTE — Anesthesia Procedure Notes (Signed)
Procedure Name: Intubation Date/Time: 09/26/2018 12:54 PM Performed by: Alain Marion, CRNA Pre-anesthesia Checklist: Patient identified, Emergency Drugs available, Suction available and Patient being monitored Patient Re-evaluated:Patient Re-evaluated prior to induction Oxygen Delivery Method: Circle System Utilized Preoxygenation: Pre-oxygenation with 100% oxygen Induction Type: IV induction Ventilation: Mask ventilation without difficulty Laryngoscope Size: Miller and 2 Tube type: Oral Tube size: 7.0 mm Number of attempts: 1 Airway Equipment and Method: Stylet and Oral airway Placement Confirmation: ETT inserted through vocal cords under direct vision,  positive ETCO2 and breath sounds checked- equal and bilateral Secured at: 21 cm Tube secured with: Tape Dental Injury: Teeth and Oropharynx as per pre-operative assessment

## 2018-09-26 NOTE — Op Note (Addendum)
DATE OF OPERATION: 09/26/2018  LOCATION: [facility]  PREOPERATIVE DIAGNOSIS: spine wound  POSTOPERATIVE DIAGNOSIS: Same  PROCEDURE: Excision of spine wound 3 x 5 x 5 cm with placement of Acell 7 x 10 cm and 1 gm, Vac placement  SURGEON: Derinda Bartus Sanger Navah Grondin, DO  ASSISTANT: Carmen Mayo, PA  EBL: 5 cc  CONDITION: Stable  COMPLICATIONS: None  INDICATION: The patient, Katherine Delgado, is a 78 y.o. female born on 01-27-1941, is here for treatment of a spine wound.   PROCEDURE DETAILS:  The patient was seen prior to surgery and marked.  The IV antibiotics were given. The patient was taken to the operating room and given a general anesthetic. A standard time out was performed and all information was confirmed by those in the room. SCDs were placed.   The patient was placed in the prone position with all bony areas padded.  The back was prepped and draped in the usual sterile fashion.  Local was injected around the incision for intraoperative hemostasis and postop pain management.  The 10 blade was used to excise the nonviable tissue at the site of the incision for an area of 3 x 5 cm.  There was 5 cm of tunneling inferior and it was 5 cm deep.  There was no hardware exposed.  There was a few cc of cloudy drainage.  This was cultured and sent for Gram stain culture and sensitivity aerobic and anaerobic.  The wound was irrigated with saline and antibiotic solution.  Hemostasis was achieved with electrocautery.  All of the ACell powder was applied. The sheet (5 x 7 cm) was applied and secured with the 5-0 Vicryl.  Restore was placed around the wound for skin protection. The sorbact was placed with KY gel and the VAC. There was an excellent seal.  The patient was allowed to wake up and taken to recovery room in stable condition at the end of the case. The family was notified at the end of the case. The advanced practice practitioner (APP) assisted throughout the case.  The APP was  essential in retraction and counter traction when needed to make the case progress smoothly.  This retraction and assistance made it possible to see the tissue plans for the procedure.  The assistance was needed for blood control, tissue re-approximation and assisted with closure of the incision site.

## 2018-09-26 NOTE — Interval H&P Note (Signed)
History and Physical Interval Note:  09/26/2018 11:32 AM  Katherine Delgado  has presented today for surgery, with the diagnosis of non-healing wound  The various methods of treatment have been discussed with the patient and family. After consideration of risks, benefits and other options for treatment, the patient has consented to  Procedure(s): Debridement of spine wound (N/A) With Acell (N/A) Vac placement (N/A) as a surgical intervention .  The patient's history has been reviewed, patient examined, no change in status, stable for surgery.  I have reviewed the patient's chart and labs.  Questions were answered to the patient's satisfaction.     Loel Lofty Cyree Chuong

## 2018-09-27 ENCOUNTER — Encounter (HOSPITAL_COMMUNITY): Payer: Self-pay | Admitting: Plastic Surgery

## 2018-09-27 NOTE — Anesthesia Postprocedure Evaluation (Signed)
Anesthesia Post Note  Patient: Katherine Delgado [facility]  Procedure(s) Performed: Debridement of spine wound (N/A Back) With Acell (N/A Back) Vac placement (N/A Back)     Patient location during evaluation: PACU Anesthesia Type: General Level of consciousness: awake and alert Pain management: pain level controlled Vital Signs Assessment: post-procedure vital signs reviewed and stable Respiratory status: spontaneous breathing, nonlabored ventilation, respiratory function stable and patient connected to nasal cannula oxygen Cardiovascular status: blood pressure returned to baseline and stable Postop Assessment: no apparent nausea or vomiting Anesthetic complications: no    Last Vitals:  Vitals:   09/26/18 1505 09/26/18 1530  BP: (!) 146/57 (!) 147/66  Pulse: 83 87  Resp: 12 15  Temp: 36.6 C   SpO2: 96% 94%    Last Pain:  Vitals:   09/26/18 1530  TempSrc:   PainSc: 3                  Chelsey L Woodrum

## 2018-09-27 NOTE — Progress Notes (Signed)
13mcg Fentanyl wasted in stericycle with Gwenlyn Fudge, RN.

## 2018-09-28 ENCOUNTER — Ambulatory Visit: Payer: Medicare Other | Admitting: Plastic Surgery

## 2018-09-28 ENCOUNTER — Telehealth: Payer: Self-pay

## 2018-09-28 DIAGNOSIS — I10 Essential (primary) hypertension: Secondary | ICD-10-CM | POA: Diagnosis not present

## 2018-09-28 DIAGNOSIS — E1140 Type 2 diabetes mellitus with diabetic neuropathy, unspecified: Secondary | ICD-10-CM | POA: Diagnosis not present

## 2018-09-28 DIAGNOSIS — T8140XD Infection following a procedure, unspecified, subsequent encounter: Secondary | ICD-10-CM | POA: Diagnosis not present

## 2018-09-28 DIAGNOSIS — R2689 Other abnormalities of gait and mobility: Secondary | ICD-10-CM | POA: Diagnosis not present

## 2018-09-28 DIAGNOSIS — F331 Major depressive disorder, recurrent, moderate: Secondary | ICD-10-CM | POA: Diagnosis not present

## 2018-09-28 DIAGNOSIS — L89892 Pressure ulcer of other site, stage 2: Secondary | ICD-10-CM | POA: Diagnosis not present

## 2018-09-28 NOTE — Telephone Encounter (Signed)
I called Katherine Delgado to inform her message received and I am awaiting for a reply

## 2018-09-28 NOTE — Telephone Encounter (Signed)
Dorian Pod with [facility] called to have Dr.Reed advise on a level 2 medication interaction between Maxzide and Potassium   Please advise

## 2018-10-01 DIAGNOSIS — F331 Major depressive disorder, recurrent, moderate: Secondary | ICD-10-CM | POA: Diagnosis not present

## 2018-10-01 DIAGNOSIS — E1165 Type 2 diabetes mellitus with hyperglycemia: Secondary | ICD-10-CM | POA: Diagnosis not present

## 2018-10-01 DIAGNOSIS — T8140XD Infection following a procedure, unspecified, subsequent encounter: Secondary | ICD-10-CM | POA: Diagnosis not present

## 2018-10-01 DIAGNOSIS — R413 Other amnesia: Secondary | ICD-10-CM | POA: Diagnosis not present

## 2018-10-01 DIAGNOSIS — M0589 Other rheumatoid arthritis with rheumatoid factor of multiple sites: Secondary | ICD-10-CM | POA: Diagnosis not present

## 2018-10-01 DIAGNOSIS — I10 Essential (primary) hypertension: Secondary | ICD-10-CM | POA: Diagnosis not present

## 2018-10-01 DIAGNOSIS — E1140 Type 2 diabetes mellitus with diabetic neuropathy, unspecified: Secondary | ICD-10-CM | POA: Diagnosis not present

## 2018-10-01 DIAGNOSIS — Z6841 Body Mass Index (BMI) 40.0 and over, adult: Secondary | ICD-10-CM | POA: Diagnosis not present

## 2018-10-01 DIAGNOSIS — Z794 Long term (current) use of insulin: Secondary | ICD-10-CM | POA: Diagnosis not present

## 2018-10-01 DIAGNOSIS — M6281 Muscle weakness (generalized): Secondary | ICD-10-CM | POA: Diagnosis not present

## 2018-10-01 DIAGNOSIS — T8131XD Disruption of external operation (surgical) wound, not elsewhere classified, subsequent encounter: Secondary | ICD-10-CM | POA: Diagnosis not present

## 2018-10-01 DIAGNOSIS — R2689 Other abnormalities of gait and mobility: Secondary | ICD-10-CM | POA: Diagnosis not present

## 2018-10-01 LAB — AEROBIC/ANAEROBIC CULTURE W GRAM STAIN (SURGICAL/DEEP WOUND): Gram Stain: NONE SEEN

## 2018-10-01 NOTE — Telephone Encounter (Signed)
Noted, meds are not new.  Monitoring bmp.

## 2018-10-01 NOTE — Telephone Encounter (Signed)
Spoke with Katherine Delgado and informed her of Dr.Reed's response

## 2018-10-02 ENCOUNTER — Encounter: Payer: Self-pay | Admitting: Plastic Surgery

## 2018-10-02 ENCOUNTER — Ambulatory Visit (INDEPENDENT_AMBULATORY_CARE_PROVIDER_SITE_OTHER): Payer: Medicare Other | Admitting: Plastic Surgery

## 2018-10-02 VITALS — BP 110/56 | HR 60 | Temp 98.3°F | Ht 66.0 in

## 2018-10-02 DIAGNOSIS — T8130XA Disruption of wound, unspecified, initial encounter: Secondary | ICD-10-CM

## 2018-10-02 DIAGNOSIS — E119 Type 2 diabetes mellitus without complications: Secondary | ICD-10-CM

## 2018-10-02 NOTE — Progress Notes (Signed)
   Subjective:    Patient ID: Katherine Delgado, female    DOB: 1940-08-31, 78 y.o.   MRN: [id number]  The patient is a 78 year old white female here for follow-up on her back wound.  She underwent debridement with VAC placement and ACell.  She is overall doing well.  The ACell is incorporating.  The sorbact is still in place.     Review of Systems  Constitutional: Negative.  Negative for activity change and appetite change.  HENT: Negative.   Eyes: Negative.   Respiratory: Negative.   Gastrointestinal: Negative.   Genitourinary: Negative.   Musculoskeletal: Negative.   Skin: Positive for wound.       Objective:   Physical Exam Vitals signs and nursing note reviewed.  Constitutional:      Appearance: Normal appearance.  HENT:     Head: Normocephalic and atraumatic.  Cardiovascular:     Rate and Rhythm: Normal rate.  Pulmonary:     Effort: Pulmonary effort is normal.  Musculoskeletal:       Arms:  Skin:    General: Skin is warm.  Neurological:     Mental Status: She is alert.   Be changed twice a week    Assessment & Plan:  Wound dehiscence  Diabetes mellitus without complication (HCC) VAC was changed.  Note was given for prescription for home health to continue with the VAC changes twice a week.  I would like to see her back in 1 week.

## 2018-10-03 DIAGNOSIS — I10 Essential (primary) hypertension: Secondary | ICD-10-CM | POA: Diagnosis not present

## 2018-10-03 DIAGNOSIS — T8131XD Disruption of external operation (surgical) wound, not elsewhere classified, subsequent encounter: Secondary | ICD-10-CM | POA: Diagnosis not present

## 2018-10-03 DIAGNOSIS — M0589 Other rheumatoid arthritis with rheumatoid factor of multiple sites: Secondary | ICD-10-CM | POA: Diagnosis not present

## 2018-10-03 DIAGNOSIS — T8140XD Infection following a procedure, unspecified, subsequent encounter: Secondary | ICD-10-CM | POA: Diagnosis not present

## 2018-10-03 DIAGNOSIS — F331 Major depressive disorder, recurrent, moderate: Secondary | ICD-10-CM | POA: Diagnosis not present

## 2018-10-03 DIAGNOSIS — E1165 Type 2 diabetes mellitus with hyperglycemia: Secondary | ICD-10-CM | POA: Diagnosis not present

## 2018-10-04 ENCOUNTER — Other Ambulatory Visit: Payer: Self-pay | Admitting: Internal Medicine

## 2018-10-05 DIAGNOSIS — T8131XD Disruption of external operation (surgical) wound, not elsewhere classified, subsequent encounter: Secondary | ICD-10-CM | POA: Diagnosis not present

## 2018-10-05 DIAGNOSIS — M0589 Other rheumatoid arthritis with rheumatoid factor of multiple sites: Secondary | ICD-10-CM | POA: Diagnosis not present

## 2018-10-05 DIAGNOSIS — T8140XD Infection following a procedure, unspecified, subsequent encounter: Secondary | ICD-10-CM | POA: Diagnosis not present

## 2018-10-05 DIAGNOSIS — F331 Major depressive disorder, recurrent, moderate: Secondary | ICD-10-CM | POA: Diagnosis not present

## 2018-10-05 DIAGNOSIS — E1165 Type 2 diabetes mellitus with hyperglycemia: Secondary | ICD-10-CM | POA: Diagnosis not present

## 2018-10-05 DIAGNOSIS — I10 Essential (primary) hypertension: Secondary | ICD-10-CM | POA: Diagnosis not present

## 2018-10-08 ENCOUNTER — Ambulatory Visit: Payer: Self-pay

## 2018-10-08 ENCOUNTER — Telehealth (INDEPENDENT_AMBULATORY_CARE_PROVIDER_SITE_OTHER): Payer: Self-pay

## 2018-10-08 ENCOUNTER — Encounter: Payer: Medicare Other | Admitting: Family

## 2018-10-08 NOTE — Telephone Encounter (Signed)
Called to check status of VOB for Hyalgan and was advised that no application was received.  Re-faxed application to [facility] at [phone].

## 2018-10-09 ENCOUNTER — Ambulatory Visit (INDEPENDENT_AMBULATORY_CARE_PROVIDER_SITE_OTHER): Payer: Medicare Other | Admitting: Plastic Surgery

## 2018-10-09 ENCOUNTER — Encounter: Payer: Self-pay | Admitting: Plastic Surgery

## 2018-10-09 VITALS — BP 153/67 | HR 68 | Temp 97.7°F | Ht 66.0 in

## 2018-10-09 DIAGNOSIS — M48062 Spinal stenosis, lumbar region with neurogenic claudication: Secondary | ICD-10-CM | POA: Diagnosis not present

## 2018-10-09 DIAGNOSIS — T8130XA Disruption of wound, unspecified, initial encounter: Secondary | ICD-10-CM | POA: Diagnosis not present

## 2018-10-09 NOTE — Progress Notes (Signed)
   Subjective:    Patient ID: Katherine Delgado, female    DOB: 10-24-1940, 78 y.o.   MRN: [id number]  The patient is a 78 yrs old wf here for follow up on her back wound.  She underwent surgery with excision and acell placement.  She has been using the VAC.  Her pain has improved.  No sign of infection or fever.  She is changing the vac weekly. There is marked improvement in the wound.  No sign of infection.   Review of Systems  Constitutional: Negative.   HENT: Negative.   Eyes: Negative.   Respiratory: Negative.   Endocrine: Negative.   Genitourinary: Negative.   Musculoskeletal: Negative.   Skin: Positive for wound.       Objective:   Physical Exam Vitals signs and nursing note reviewed.  Constitutional:      Appearance: Normal appearance.  HENT:     Head: Normocephalic and atraumatic.  Cardiovascular:     Rate and Rhythm: Normal rate.  Pulmonary:     Effort: Pulmonary effort is normal.  Skin:    General: Skin is warm.  Neurological:     Mental Status: She is alert.  Psychiatric:        Mood and Affect: Mood normal.        Thought Content: Thought content normal.        Judgment: Judgment normal.       Assessment & Plan:  Wound dehiscence  Spinal stenosis of lumbar region with neurogenic claudication  We changed the VAC.  Continue with Vac changes 1-2 times a week.  Follow up in 10 days.

## 2018-10-10 ENCOUNTER — Telehealth (INDEPENDENT_AMBULATORY_CARE_PROVIDER_SITE_OTHER): Payer: Self-pay

## 2018-10-10 ENCOUNTER — Encounter: Payer: Medicare Other | Admitting: Family

## 2018-10-10 NOTE — Telephone Encounter (Signed)
Please schedule patient an appointment with Dr. Estanislado Pandy or Lovena Le for gel injection.  Thank You.  Patient is approved for Hyalgan series, bilateral knee. [place] Patient will be responsible for 20% OOP. No Co-pay No PA required

## 2018-10-12 DIAGNOSIS — F331 Major depressive disorder, recurrent, moderate: Secondary | ICD-10-CM | POA: Diagnosis not present

## 2018-10-12 DIAGNOSIS — T8140XD Infection following a procedure, unspecified, subsequent encounter: Secondary | ICD-10-CM | POA: Diagnosis not present

## 2018-10-12 DIAGNOSIS — T8131XD Disruption of external operation (surgical) wound, not elsewhere classified, subsequent encounter: Secondary | ICD-10-CM | POA: Diagnosis not present

## 2018-10-12 DIAGNOSIS — E1165 Type 2 diabetes mellitus with hyperglycemia: Secondary | ICD-10-CM | POA: Diagnosis not present

## 2018-10-12 DIAGNOSIS — M0589 Other rheumatoid arthritis with rheumatoid factor of multiple sites: Secondary | ICD-10-CM | POA: Diagnosis not present

## 2018-10-12 DIAGNOSIS — I10 Essential (primary) hypertension: Secondary | ICD-10-CM | POA: Diagnosis not present

## 2018-10-18 DIAGNOSIS — F331 Major depressive disorder, recurrent, moderate: Secondary | ICD-10-CM | POA: Diagnosis not present

## 2018-10-18 DIAGNOSIS — M0589 Other rheumatoid arthritis with rheumatoid factor of multiple sites: Secondary | ICD-10-CM | POA: Diagnosis not present

## 2018-10-18 DIAGNOSIS — T8131XD Disruption of external operation (surgical) wound, not elsewhere classified, subsequent encounter: Secondary | ICD-10-CM | POA: Diagnosis not present

## 2018-10-18 DIAGNOSIS — E1165 Type 2 diabetes mellitus with hyperglycemia: Secondary | ICD-10-CM | POA: Diagnosis not present

## 2018-10-18 DIAGNOSIS — I10 Essential (primary) hypertension: Secondary | ICD-10-CM | POA: Diagnosis not present

## 2018-10-18 DIAGNOSIS — T8140XD Infection following a procedure, unspecified, subsequent encounter: Secondary | ICD-10-CM | POA: Diagnosis not present

## 2018-10-22 ENCOUNTER — Encounter: Payer: Self-pay | Admitting: Internal Medicine

## 2018-10-22 ENCOUNTER — Ambulatory Visit (INDEPENDENT_AMBULATORY_CARE_PROVIDER_SITE_OTHER): Payer: Medicare Other | Admitting: Internal Medicine

## 2018-10-22 ENCOUNTER — Telehealth: Payer: Self-pay | Admitting: *Deleted

## 2018-10-22 VITALS — BP 120/80 | HR 74 | Temp 98.0°F | Ht 66.0 in | Wt 241.0 lb

## 2018-10-22 DIAGNOSIS — F330 Major depressive disorder, recurrent, mild: Secondary | ICD-10-CM | POA: Diagnosis not present

## 2018-10-22 DIAGNOSIS — M159 Polyosteoarthritis, unspecified: Secondary | ICD-10-CM

## 2018-10-22 DIAGNOSIS — E039 Hypothyroidism, unspecified: Secondary | ICD-10-CM

## 2018-10-22 DIAGNOSIS — G8929 Other chronic pain: Secondary | ICD-10-CM

## 2018-10-22 DIAGNOSIS — E1142 Type 2 diabetes mellitus with diabetic polyneuropathy: Secondary | ICD-10-CM

## 2018-10-22 DIAGNOSIS — E1151 Type 2 diabetes mellitus with diabetic peripheral angiopathy without gangrene: Secondary | ICD-10-CM

## 2018-10-22 DIAGNOSIS — M5441 Lumbago with sciatica, right side: Secondary | ICD-10-CM | POA: Diagnosis not present

## 2018-10-22 DIAGNOSIS — J42 Unspecified chronic bronchitis: Secondary | ICD-10-CM

## 2018-10-22 DIAGNOSIS — Z794 Long term (current) use of insulin: Secondary | ICD-10-CM

## 2018-10-22 DIAGNOSIS — M8949 Other hypertrophic osteoarthropathy, multiple sites: Secondary | ICD-10-CM

## 2018-10-22 DIAGNOSIS — E785 Hyperlipidemia, unspecified: Secondary | ICD-10-CM

## 2018-10-22 DIAGNOSIS — R300 Dysuria: Secondary | ICD-10-CM

## 2018-10-22 DIAGNOSIS — M150 Primary generalized (osteo)arthritis: Secondary | ICD-10-CM

## 2018-10-22 DIAGNOSIS — I872 Venous insufficiency (chronic) (peripheral): Secondary | ICD-10-CM

## 2018-10-22 DIAGNOSIS — E1169 Type 2 diabetes mellitus with other specified complication: Secondary | ICD-10-CM

## 2018-10-22 MED ORDER — LISINOPRIL 20 MG PO TABS: 20.0000 mg | ORAL_TABLET | Freq: Every day | ORAL | 1 refills | Status: DC

## 2018-10-22 MED ORDER — TIZANIDINE HCL 2 MG PO TABS: 2.0000 mg | ORAL_TABLET | Freq: Three times a day (TID) | ORAL | 1 refills | Status: DC

## 2018-10-22 NOTE — Telephone Encounter (Signed)
Patient called and stated that she had an appointment today with Dr. Mariea Clonts and her pain medication was discussed. Patient stated that she needs you to call in which ever one you think she needs to be on. Stated that it was discussed at appointment and she wasn't sure what was decided. Please Advise.

## 2018-10-22 NOTE — Patient Instructions (Signed)
You may use tylenol 500mg  up to 6 per day in b/w the oxycodone.

## 2018-10-22 NOTE — Telephone Encounter (Signed)
We decided not to change her medication--she is to use tylenol and her hydrocodone as directed.  If her hydrocodone is due, we can refill it.

## 2018-10-22 NOTE — Progress Notes (Signed)
  Subjective:     Patient ID: Katherine Delgado, female   DOB: 02-26-1941, 78 y.o.   MRN: [id number]  HPI  Patient is a 78 y.o. female who presents for follow up of back pain following a wound vac placement on 09/26/2018. Patient has wound nurse check on wound vac every Tuesday and Thursday. Back pain is about the same and patient takes oxycodone for and zanaflex for muscle spasms.   Right knee pain and leg swelling. Patient seeing Dr. Patrecia Pour and starting knee injections this week.   Bilateral leg swelling. Patient takes fluid pill daily and elevates legs. Patient uses compression stockings, but not wearing today.   She states urinary symptoms of dysuria and incontinence. Incontinence started before back surgeries. She has had frequent UTIs. Patient states she has poor hydration for fear of using the bathroom.      Review of Systems  Constitutional: Negative for activity change, chills, fever and unexpected weight change.  Respiratory: Negative for cough, shortness of breath and wheezing.   Cardiovascular: Positive for leg swelling. Negative for chest pain and palpitations.  Gastrointestinal: Negative for abdominal pain.  Musculoskeletal: Positive for arthralgias, back pain, joint swelling and myalgias.  Skin: Positive for wound. Negative for color change and rash.  Neurological: Negative for dizziness, numbness and headaches.       Objective:   Physical Exam Constitutional:      Appearance: Normal appearance.  HENT:     Head: Normocephalic and atraumatic.     Mouth/Throat:     Mouth: Mucous membranes are dry.  Cardiovascular:     Rate and Rhythm: Normal rate and regular rhythm.     Heart sounds: No murmur.  Pulmonary:     Effort: Pulmonary effort is normal. No respiratory distress.     Breath sounds: Normal breath sounds. No wheezing or rhonchi.  Chest:     Chest wall: No tenderness.  Abdominal:     General: Abdomen is flat. There is no distension.     Palpations: Abdomen  is soft.     Tenderness: There is no abdominal tenderness.  Musculoskeletal:        General: Tenderness present.     Right lower leg: Edema present.     Left lower leg: Edema present.  Skin:    General: Skin is warm and dry.     Findings: No bruising, erythema, lesion or rash.     Comments: Wound vac in place on lower back. Skin around vac clean, dry and intact. No redness. Minimal serosanguinous drainage in wound vac canister.  Neurological:     General: No focal deficit present.     Mental Status: She is alert and oriented to person, place, and time.  Psychiatric:        Mood and Affect: Mood normal.        Behavior: Behavior normal.        Assessment:     Dysuria    Wound dehicence      Chronic venous insuffiencey     Type II diabetes with peripheral artery disease     Degenerative disc disease    Right knee arthritis          Plan:     Repeat U/A using hat to collect urine    Fasting labs in 6 weeks    Check compression stockings with medical supply store and        get refit

## 2018-10-22 NOTE — Progress Notes (Signed)
Location:  [facility] clinic Provider:  Cereniti Curb L. Mariea Clonts, D.O., C.M.D.  Goals of Care:  Advanced Directives 08/17/2018  Does Patient Have a Medical Advance Directive? Yes;No  Type of Advance Directive Living will  Does patient want to make changes to medical advance directive? No - Patient declined  Copy of [place] in Chart? -  Would patient like information on creating a medical advance directive? No - Patient declined  Pre-existing out of facility DNR order (yellow form or pink MOST form) -     Chief Complaint  Patient presents with  . Medical Management of Chronic Issues    6 week follow-up, discuss pain meds, UTI?    HPI: Patient is a 78 y.o. female seen today for 6 week f/u re: her dehisced wound.  She wants also to discuss her pain regimen and has dysuria and itching and concerned about UTI.   Doing fine.  Wound vac placed 2/5.  Has home health Tues and Friday.  Very little drainage.  [facility] following.     Having right knee pain.  Dr. Kathee Delton referred her to ortho for 5 injections in a series (hyalgan).    Has significant edema today.  She is elevating them and says she wears compression hose.  She is still having dysuria.  No fever.  Still has urinary incontinence.  Wearing pads--says she is changing them.  Not hydrating well because of fear of incontinence.  It's only some better from last time when she had mixed organisms.  Getting up hourly at night.    She's on high protein supplement now.    Not yet due for hba1c until after 3/31.    Past Medical History:  Diagnosis Date  . Abnormality of gait 04/19/2016  . Acute infective polyneuritis ([place]) 2002  . Allergic rhinitis due to pollen   . Chronic pain syndrome   . COPD (chronic obstructive pulmonary disease) (HCC)    chronic bronchitis  . Depressive disorder, not elsewhere classified   . Diabetes mellitus without complication ([place])   . Diaphragmatic hernia without mention of obstruction or  gangrene   . Dyslipidemia   . Fibromyalgia   . GERD (gastroesophageal reflux disease)    with h/o esophagitis  . Guillain-Barre syndrome ([place])   . History of benign thymus tumor   . Insomnia, unspecified   . Lumbar spinal stenosis 03/30/2018   L4-5 level, severe  . Miscarriage 1962  . Mixed hyperlipidemia   . Morbid obesity ([place])   . Osteoporosis   . Pneumonia   . Polyneuropathy in diabetes(357.2)   . Rheumatoid arthritis(714.0)   . Spondylosis, lumbosacral   . Spontaneous ecchymoses   . Type II or unspecified type diabetes mellitus with peripheral circulatory disorders, uncontrolled(250.72)   . Unspecified chronic bronchitis ([place])   . Unspecified essential hypertension   . Unspecified hypothyroidism   . Unspecified pruritic disorder   . Unspecified urinary incontinence     Past Surgical History:  Procedure Laterality Date  . ABDOMINAL HYSTERECTOMY  1974  . abdominal tumor  2002  . APPENDECTOMY    . APPLICATION OF A-CELL OF BACK N/A 09/26/2018   Procedure: With Acell;  Surgeon: Wallace Going, DO;  Location: [place];  Service: Plastics;  Laterality: N/A;  . APPLICATION OF WOUND VAC N/A 09/26/2018   Procedure: Vac placement;  Surgeon: Wallace Going, DO;  Location: [place];  Service: Plastics;  Laterality: N/A;  . [place]  . BRONCHOSCOPY  2001  .  CHOLECYSTECTOMY  1984  . INCISION AND DRAINAGE OF WOUND N/A 09/26/2018   Procedure: Debridement of spine wound;  Surgeon: Wallace Going, DO;  Location: [place];  Service: Plastics;  Laterality: N/A;  . KNEE SURGERY Bilateral 08/27/2010 (L) and 01/11/2011 (R)  . LUMBAR LAMINECTOMY/DECOMPRESSION MICRODISCECTOMY N/A 07/03/2018   Procedure: Lumbar Three to Lumbar Five Laminectomy;  Surgeon: Judith Part, MD;  Location: [place];  Service: Neurosurgery;  Laterality: N/A;  . LUMBAR WOUND DEBRIDEMENT N/A 08/20/2018   Procedure: POSTERIOR LUMBAR SPINAL WOUND DEBRIDEMENT AND REVISION;  Surgeon: Judith Part, MD;   Location: [place];  Service: Neurosurgery;  Laterality: N/A;  POSTERIOR LUMBAR SPINAL WOUND REVISION  . [place]  . OVARIAN CYST SURGERY  1968  . thymus tumor    . 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 TONSILLECTOMY      Allergies  Allergen Reactions  . Influenza Vaccines Other (See Comments)    "h/o Guillain Barre; dr's told me years ago never to take another flu shot as it could relapse Rosalee Kaufman; has to do with when vaccine being changed to H1N1 virus"  . Penicillins Hives, Itching, Swelling and Rash    Has patient had a PCN reaction causing immediate rash, facial/tongue/throat swelling, SOB or lightheadedness with hypotension:Unknown Has patient had a PCN reaction causing severe rash involving mucus membranes or skin necrosis: Unknown Has patient had a PCN reaction that required hospitalization:Unknown Has patient had a PCN reaction occurring within the last 10 years: Unknown If all of the above answers are "NO", then may proceed with Cephalosporin use.   . Statins Other (See Comments)    Myopathy, transaminitis  . Sulfamethoxazole-Trimethoprim Itching    Outpatient Encounter Medications as of 10/22/2018  Medication Sig  . acetaminophen (TYLENOL) 325 MG tablet Take 650 mg by mouth every 6 (six) hours as needed (for pain.).  Marland Kitchen albuterol (PROVENTIL HFA;VENTOLIN HFA) 108 (90 Base) MCG/ACT inhaler Inhale 2 puffs into the lungs every 6 (six) hours as needed for wheezing or shortness of breath.  Marland Kitchen amLODipine (NORVASC) 10 MG tablet Take 1 tablet (10 mg total) by mouth daily. Please schedule appointment for refills.  Marland Kitchen aspirin EC 81 MG tablet Take 81 mg by mouth 2 (two) times a week.  . B-D UF III MINI PEN NEEDLES 31G X 5 MM MISC USE AS DIRECTED  . cadexomer iodine (IODOSORB) 0.9 % gel Apply 1 application topically daily as needed for wound care (toe).  Marland Kitchen docusate sodium (COLACE) 100 MG capsule Take 100 mg by mouth daily as needed (constipation.).   Marland Kitchen folic acid (FOLVITE) 1 MG tablet Take 2  tablets (2 mg total) by mouth daily.  . furosemide (LASIX) 20 MG tablet TAKE 1 TABLET BY MOUTH TWICE DAILY AS NEEDED FOR 3 POUND WEIGHT GAIN IN ONE DAY OR 5 POUNDS IN ONE WEEK  . gabapentin (NEURONTIN) 600 MG tablet TAKE 1 TABLET BY MOUTH THREE TIMES DAILY  . HUMALOG KWIKPEN 200 UNIT/ML SOPN INJECT 20 UNITS UNDER THE SKIN THREE TIMES DAILY BEFORE A MEAL  . levothyroxine (SYNTHROID, LEVOTHROID) 137 MCG tablet TAKE 1 TABLET BY MOUTH DAILY BEFORE BREAKFAST ON AN EMPTY STOMACH( LABS OVERDUE)  . lisinopril (PRINIVIL,ZESTRIL) 20 MG tablet Take 1 tablet (20 mg total) by mouth daily.  Marland Kitchen loperamide (IMODIUM A-D) 2 MG tablet Take 2 mg by mouth 4 (four) times daily as needed for diarrhea or loose stools.  . magnesium oxide (MAG-OX) 400 MG tablet Take 400 mg by mouth daily as  needed (for leg cramps).   . Melatonin 10 MG TABS Take 10 mg by mouth at bedtime.  . metoprolol tartrate (LOPRESSOR) 50 MG tablet Take 50 mg by mouth 2 (two) times daily.   Marland Kitchen MYRBETRIQ 50 MG TB24 tablet TAKE 1 TABLET(50 MG) BY MOUTH DAILY  . nystatin (NYSTATIN) powder Apply topically daily as needed.  . nystatin cream (MYCOSTATIN) Apply 1 application topically 2 (two) times daily. To skin folds  . omega-3 acid ethyl esters (LOVAZA) 1 g capsule TAKE ONE CAPSULE BY MOUTH EVERY DAY  . oxyCODONE (ROXICODONE) 5 MG immediate release tablet Take 1 tablet (5 mg total) by mouth every 6 (six) hours as needed for severe pain.  . polyvinyl alcohol (LIQUIFILM TEARS) 1.4 % ophthalmic solution Place 1 drop into both eyes 4 (four) times daily as needed (for dry/irritated eyes).   . potassium chloride (MICRO-K) 10 MEQ CR capsule Take 2 capsules (20 mEq total) by mouth 2 (two) times daily as needed (when taking a dose of lasix.).  Marland Kitchen tiZANidine (ZANAFLEX) 2 MG tablet Take 1 tablet (2 mg total) by mouth 3 (three) times daily.  . TOUJEO SOLOSTAR 300 UNIT/ML SOPN INJECT 100 UNITS UNDER THE SKIN EVERY NIGHT AT BEDTIME  . triamterene-hydrochlorothiazide  (MAXZIDE-25) 37.5-25 MG tablet TAKE 1 TABLET BY MOUTH DAILY  . umeclidinium-vilanterol (ANORO ELLIPTA) 62.5-25 MCG/INH AEPB Inhale 1 puff into the lungs daily.  Marland Kitchen venlafaxine XR (EFFEXOR-XR) 75 MG 24 hr capsule Take one capsule by mouth once daily with breakfast  . VICTOZA 18 MG/3ML SOPN ADMINISTER 1.8 MG UNDER THE SKIN DAILY FOR BLOOD SUGAR  . [DISCONTINUED] lisinopril (PRINIVIL,ZESTRIL) 20 MG tablet Take 1 tablet (20 mg total) by mouth daily. Please schedule appointment for refills.  . [DISCONTINUED] tiZANidine (ZANAFLEX) 2 MG tablet Take 1 tablet (2 mg total) by mouth 3 (three) times daily.  . [DISCONTINUED] pregabalin (LYRICA) 150 MG capsule Take 1 capsule (150 mg total) by mouth 2 (two) times daily. (Patient not taking: Reported on 10/09/2018)   No facility-administered encounter medications on file as of 10/22/2018.     Review of Systems:  Review of Systems  Constitutional: Positive for malaise/fatigue. Negative for chills and fever.  HENT: Negative for congestion.   Eyes: Negative for blurred vision.  Respiratory: Negative for cough, shortness of breath and wheezing.   Cardiovascular: Positive for leg swelling. Negative for chest pain and palpitations.  Gastrointestinal: Negative for abdominal pain, blood in stool, constipation, diarrhea and melena.  Genitourinary: Positive for dysuria, frequency and urgency. Negative for flank pain and hematuria.  Musculoskeletal: Positive for back pain, joint pain and myalgias. Negative for falls.  Skin: Negative for itching and rash.       Wound vac in place on lumbar incision that was dehisced  Neurological: Positive for tingling and sensory change. Negative for dizziness and loss of consciousness.  Endo/Heme/Allergies: Bruises/bleeds easily.  Psychiatric/Behavioral: Negative for depression and memory loss. The patient is not nervous/anxious and does not have insomnia.     Health Maintenance  Topic Date Due  . OPHTHALMOLOGY EXAM  12/20/1950  .  FOOT EXAM  09/27/2016  . INFLUENZA VACCINE  08/22/2019 (Originally 03/22/2018)  . HEMOGLOBIN A1C  02/19/2019  . DEXA SCAN  09/20/2025  . TETANUS/TDAP  05/28/2026  . PNA vac Low Risk Adult  Completed    Physical Exam: Vitals:   10/22/18 1410  BP: 120/80  Pulse: 74  Temp: 98 F (36.7 C)  TempSrc: Oral  SpO2: 96%  Weight: 241 lb (109.3 kg)  Height: 5\' 6"  (1.676 m)   Body mass index is 38.9 kg/m. Physical Exam Vitals signs reviewed.  Constitutional:      General: She is not in acute distress.    Appearance: Normal appearance. She is obese. She is not ill-appearing or toxic-appearing.  HENT:     Head: Normocephalic and atraumatic.  Cardiovascular:     Rate and Rhythm: Normal rate and regular rhythm.     Pulses: Normal pulses.     Heart sounds: Normal heart sounds.  Pulmonary:     Effort: Pulmonary effort is normal.     Breath sounds: Normal breath sounds. No wheezing or rales.  Abdominal:     General: Bowel sounds are normal.     Palpations: Abdomen is soft.  Musculoskeletal: Normal range of motion.        General: Swelling and tenderness present.     Right lower leg: Edema present.     Left lower leg: Edema present.     Comments: Right knee tender with mild effusion  Skin:    General: Skin is warm and dry.     Comments: Vac in place which appears to be working properly; no surrounding erythema or warmth  Neurological:     General: No focal deficit present.     Mental Status: She is alert and oriented to person, place, and time.     Cranial Nerves: No cranial nerve deficit.     Gait: Gait abnormal.     Comments: Using manual wheelchair right now  Psychiatric:        Mood and Affect: Mood normal.     Labs reviewed: Basic Metabolic Panel: Recent Labs    04/06/18 0930  08/09/18 1224 08/21/18 1208 08/22/18 0447 09/26/18 1016  NA 141   < > 142 134* 135 141  K 4.8   < > 4.1 5.0 4.0 3.7  CL 103   < > 104 98 99 102  CO2 31   < > 30 28 28 24   GLUCOSE 102*   < >  45* 446* 270* 153*  BUN 18   < > 19 26* 22 22  CREATININE 0.93   < > 1.00* 1.11* 0.93 0.94  CALCIUM 9.1   < > 9.8 8.8* 8.5* 9.8  PHOS  --   --   --   --  2.5  --   TSH 0.73  --  0.84  --   --   --    < > = values in this interval not displayed.   Liver Function Tests: Recent Labs    01/22/18 0941 04/06/18 0930 08/09/18 1224 08/22/18 0447  AST 26 41* 17  --   ALT 14 18 9   --   BILITOT 0.5 0.4 0.4  --   PROT 7.0 6.3 6.5  --   ALBUMIN  --   --   --  2.6*   No results for input(s): LIPASE, AMYLASE in the last 8760 hours. No results for input(s): AMMONIA in the last 8760 hours. CBC: Recent Labs    03/06/18 0941 04/06/18 0930  08/09/18 1224 08/17/18 1817 09/26/18 1016  WBC 16.6* 14.5*   < > 17.2* 13.3* 16.8*  NEUTROABS 12,915* 10,701*  --  12,264*  --   --   HGB 13.7 13.2   < > 13.9 12.9 13.9  HCT 42.1 39.7   < > 42.9 41.8 45.6  MCV 85.1 83.8   < > 84.8 87.1 85.6  PLT 307 346   < >  430* 292 387   < > = values in this interval not displayed.   Lipid Panel: Recent Labs    04/06/18 0930 08/09/18 1224  CHOL 129 159  HDL 29* 30*  LDLCALC 75 97  TRIG 145 219*  CHOLHDL 4.4 5.3*   Lab Results  Component Value Date   HGBA1C 6.6 (H) 08/21/2018    Procedures since last visit: No results found.  Assessment/Plan 1. Type 2 diabetes mellitus with diabetic polyneuropathy, with long-term current use of insulin (HCC) -ongoing, control better than it's been last check -cont same regimen and reassess before next visit -no new problems with left great toe - CBC with Differential/Platelet; Future - COMPLETE METABOLIC PANEL WITH GFR; Future - Hemoglobin A1c; Future  2. Chronic bronchitis, unspecified chronic bronchitis type ([place]) -no active concerns, stable, cont same meds and cont to monitor  3. Mild episode of recurrent major depressive disorder (HCC) -stable, spirits good today, lumbar spine wound slowly healing with wound vac  4. Diabetes mellitus type 2 with  peripheral artery disease (HCC) - as above, cont current regimen and monitor for new wounds - CBC with Differential/Platelet; Future - COMPLETE METABOLIC PANEL WITH GFR; Future - Hemoglobin A1c; Future  5. Dysuria - recurrent; again counseled on hydration and hygiene practices, recheck UA c+s due to worsened symptoms reported - Urinalysis, Routine w reflex microscopic - Urine Culture  6. Chronic venous insufficiency -not a candidate for strong compression with her PAD, elevate feet at rest, avoid high sodium foods   7. Acquired hypothyroidism -stable, cont current levothyroxine and monitor - TSH; Future  8. Hyperlipidemia associated with type 2 diabetes mellitus ([place]) - not checked in a long time as typically not fasting at appts, f/u before next visit--goal <70 due to DM - Lipid panel; Future  9.  OA of multiple sites -is on oxycodone only since her surgery and I've been trying to taper it but pt continues to report ongoing joint pains -is getting knee injections thru [facility] -we agreed to continue just her one opioid and not add anything else, but then she called back and again asked about changing--same med was continued as she is also on several other agents  10.  Chronic low back pain with sciatica -s/p lumbar laminectomy and then debridement and then wound vac placement -appears to finally be making progress  Labs/tests ordered:   Orders Placed This Encounter  Procedures  . Urine Culture  . MICROSCOPIC MESSAGE  . Urinalysis, Routine w reflex microscopic  . CBC with Differential/Platelet    Standing Status:   Future    Standing Expiration Date:   10/22/2019  . COMPLETE METABOLIC PANEL WITH GFR    Standing Status:   Future    Standing Expiration Date:   10/22/2019  . Hemoglobin A1c    Standing Status:   Future    Standing Expiration Date:   10/22/2019  . Lipid panel    Standing Status:   Future    Standing Expiration Date:   10/22/2019  . TSH    Standing Status:    Future    Standing Expiration Date:   10/22/2019   Next appt:  4 mos med mgt fasting labs before   Agustus Mane L. Ganon Demasi, D.O. [facility] [address], [place] [address] Cell Phone (Mon-Fri 8am-5pm):  [phone] On Call:  [phone] & follow prompts after 5pm & weekends Office Phone:  [phone] Office Fax:  [phone]

## 2018-10-23 DIAGNOSIS — T8131XD Disruption of external operation (surgical) wound, not elsewhere classified, subsequent encounter: Secondary | ICD-10-CM | POA: Diagnosis not present

## 2018-10-23 DIAGNOSIS — F331 Major depressive disorder, recurrent, moderate: Secondary | ICD-10-CM | POA: Diagnosis not present

## 2018-10-23 DIAGNOSIS — T8140XD Infection following a procedure, unspecified, subsequent encounter: Secondary | ICD-10-CM | POA: Diagnosis not present

## 2018-10-23 DIAGNOSIS — E1165 Type 2 diabetes mellitus with hyperglycemia: Secondary | ICD-10-CM | POA: Diagnosis not present

## 2018-10-23 DIAGNOSIS — M0589 Other rheumatoid arthritis with rheumatoid factor of multiple sites: Secondary | ICD-10-CM | POA: Diagnosis not present

## 2018-10-23 DIAGNOSIS — I10 Essential (primary) hypertension: Secondary | ICD-10-CM | POA: Diagnosis not present

## 2018-10-23 MED ORDER — OXYCODONE HCL 5 MG PO TABS: 5.0000 mg | ORAL_TABLET | Freq: Four times a day (QID) | ORAL | 0 refills | Status: DC | PRN

## 2018-10-23 NOTE — Telephone Encounter (Signed)
Patient notified and agreed.  Patient stated that she was due for refill. [place] Verified LR: 09/26/2018 Pended Rx and sent to Dr. Mariea Clonts for approval.

## 2018-10-24 ENCOUNTER — Ambulatory Visit (INDEPENDENT_AMBULATORY_CARE_PROVIDER_SITE_OTHER): Payer: Medicare Other | Admitting: Physician Assistant

## 2018-10-24 DIAGNOSIS — M170 Bilateral primary osteoarthritis of knee: Secondary | ICD-10-CM

## 2018-10-24 LAB — URINALYSIS, ROUTINE W REFLEX MICROSCOPIC
Bilirubin Urine: NEGATIVE
Glucose, UA: NEGATIVE
Hgb urine dipstick: NEGATIVE
Hyaline Cast: NONE SEEN /LPF
Ketones, ur: NEGATIVE
Nitrite: NEGATIVE
Protein, ur: NEGATIVE
Specific Gravity, Urine: 1.023 (ref 1.001–1.03)
WBC, UA: >=60 /HPF — AB (ref 0–5)
pH: <=5 (ref 5.0–8.0)

## 2018-10-24 LAB — URINE CULTURE
MICRO NUMBER:: 264903
SPECIMEN QUALITY:: ADEQUATE

## 2018-10-24 MED ORDER — LIDOCAINE HCL 1 % IJ SOLN
1.5000 mL | INTRAMUSCULAR | Status: AC | PRN
  Administered 2018-10-24: 1.5 mL

## 2018-10-24 MED ORDER — SODIUM HYALURONATE (VISCOSUP) 20 MG/2ML IX SOSY
20.0000 mg | PREFILLED_SYRINGE | INTRA_ARTICULAR | Status: AC | PRN
  Administered 2018-10-24: 20 mg via INTRA_ARTICULAR

## 2018-10-24 NOTE — Progress Notes (Signed)
   Procedure Note  Patient: Katherine Delgado             Date of Birth: January 25, 1941           MRN: [id number]             Visit Date: 10/24/2018  Procedures: Visit Diagnoses: Primary osteoarthritis of both knees Hyalgan #1 bilateral knee joint injections B/B Large Joint Inj: bilateral knee on 10/24/2018 10:32 AM Indications: pain Details: 27 G 1.5 in needle, medial approach  Arthrogram: No  Medications (Right): 20 mg Sodium Hyaluronate 20 MG/2ML; 1.5 mL lidocaine 1 % Aspirate (Right): 0 mL Medications (Left): 20 mg Sodium Hyaluronate 20 MG/2ML; 1.5 mL lidocaine 1 % Aspirate (Left): 0 mL Outcome: tolerated well, no immediate complications Procedure, treatment alternatives, risks and benefits explained, specific risks discussed. Consent was given by the patient. Immediately prior to procedure a time out was called to verify the correct patient, procedure, equipment, support staff and site/side marked as required. Patient was prepped and draped in the usual sterile fashion.     Patient tolerated the procedure well.  [name], PA-C

## 2018-10-25 ENCOUNTER — Telehealth: Payer: Self-pay | Admitting: *Deleted

## 2018-10-25 MED ORDER — CEPHALEXIN 500 MG PO CAPS: 500.0000 mg | ORAL_CAPSULE | Freq: Two times a day (BID) | ORAL | 0 refills | Status: AC

## 2018-10-25 NOTE — Telephone Encounter (Signed)
-----   Message from Gayland Curry, DO sent at 10/24/2018  3:09 PM EST ----- Given patient's ongoing symptoms, let's treat with keflex 500mg  po bid for 10 days for UTI.  Technically not enough bacteria grew out.

## 2018-10-25 NOTE — Telephone Encounter (Signed)
rx sent to pharmacy by e-script Spoke with patient and advised results   

## 2018-10-26 DIAGNOSIS — E1165 Type 2 diabetes mellitus with hyperglycemia: Secondary | ICD-10-CM | POA: Diagnosis not present

## 2018-10-26 DIAGNOSIS — T8140XD Infection following a procedure, unspecified, subsequent encounter: Secondary | ICD-10-CM | POA: Diagnosis not present

## 2018-10-26 DIAGNOSIS — I10 Essential (primary) hypertension: Secondary | ICD-10-CM | POA: Diagnosis not present

## 2018-10-26 DIAGNOSIS — F331 Major depressive disorder, recurrent, moderate: Secondary | ICD-10-CM | POA: Diagnosis not present

## 2018-10-26 DIAGNOSIS — M0589 Other rheumatoid arthritis with rheumatoid factor of multiple sites: Secondary | ICD-10-CM | POA: Diagnosis not present

## 2018-10-26 DIAGNOSIS — T8131XD Disruption of external operation (surgical) wound, not elsewhere classified, subsequent encounter: Secondary | ICD-10-CM | POA: Diagnosis not present

## 2018-10-30 ENCOUNTER — Ambulatory Visit (INDEPENDENT_AMBULATORY_CARE_PROVIDER_SITE_OTHER): Payer: Medicare Other | Admitting: Plastic Surgery

## 2018-10-30 ENCOUNTER — Encounter: Payer: Self-pay | Admitting: Plastic Surgery

## 2018-10-30 VITALS — BP 146/75 | HR 65 | Temp 98.3°F | Ht 66.0 in | Wt 237.0 lb

## 2018-10-30 DIAGNOSIS — T8130XA Disruption of wound, unspecified, initial encounter: Secondary | ICD-10-CM | POA: Diagnosis not present

## 2018-10-30 NOTE — Progress Notes (Signed)
   Subjective:    Patient ID: Katherine Delgado, female    DOB: [birth year], 78 y.o.   MRN: [id number]  The patient is a 78 year old white female here for follow-up on her back wound.  She had dehiscence after surgery and was sent to [place] for evaluation.  We did excision and ACell placement with the VAC.  She has been using the vac and changing it weekly.  The wound is only 1 mm in width.  No drainage noted.  The periwound area looks great.   Review of Systems  Constitutional: Negative.  Negative for activity change and appetite change.  HENT: Negative.   Respiratory: Negative.   Cardiovascular: Negative.   Genitourinary: Negative.   Musculoskeletal: Negative.   Psychiatric/Behavioral: Negative.        Objective:   Physical Exam Vitals signs and nursing note reviewed.  Constitutional:      Appearance: Normal appearance.  HENT:     Head: Normocephalic and atraumatic.  Cardiovascular:     Rate and Rhythm: Normal rate.  Pulmonary:     Effort: Pulmonary effort is normal.  Musculoskeletal:       Arms:  Neurological:     Mental Status: She is alert.  Psychiatric:        Mood and Affect: Mood normal.        Thought Content: Thought content normal.         Assessment & Plan:  Wound dehiscence Can discontinue the VAC.  Can shower and get the area wet.  Do dressing change daily with Vaseline. Follow-up as needed.

## 2018-10-31 ENCOUNTER — Ambulatory Visit (INDEPENDENT_AMBULATORY_CARE_PROVIDER_SITE_OTHER): Payer: Medicare Other | Admitting: Physician Assistant

## 2018-10-31 ENCOUNTER — Other Ambulatory Visit: Payer: Self-pay

## 2018-10-31 DIAGNOSIS — M0589 Other rheumatoid arthritis with rheumatoid factor of multiple sites: Secondary | ICD-10-CM | POA: Diagnosis not present

## 2018-10-31 DIAGNOSIS — M6281 Muscle weakness (generalized): Secondary | ICD-10-CM | POA: Diagnosis not present

## 2018-10-31 DIAGNOSIS — T8140XD Infection following a procedure, unspecified, subsequent encounter: Secondary | ICD-10-CM | POA: Diagnosis not present

## 2018-10-31 DIAGNOSIS — M170 Bilateral primary osteoarthritis of knee: Secondary | ICD-10-CM

## 2018-10-31 DIAGNOSIS — T8131XD Disruption of external operation (surgical) wound, not elsewhere classified, subsequent encounter: Secondary | ICD-10-CM | POA: Diagnosis not present

## 2018-10-31 DIAGNOSIS — R2689 Other abnormalities of gait and mobility: Secondary | ICD-10-CM | POA: Diagnosis not present

## 2018-10-31 DIAGNOSIS — E1165 Type 2 diabetes mellitus with hyperglycemia: Secondary | ICD-10-CM | POA: Diagnosis not present

## 2018-10-31 DIAGNOSIS — R413 Other amnesia: Secondary | ICD-10-CM | POA: Diagnosis not present

## 2018-10-31 DIAGNOSIS — Z6841 Body Mass Index (BMI) 40.0 and over, adult: Secondary | ICD-10-CM | POA: Diagnosis not present

## 2018-10-31 DIAGNOSIS — I10 Essential (primary) hypertension: Secondary | ICD-10-CM | POA: Diagnosis not present

## 2018-10-31 DIAGNOSIS — Z794 Long term (current) use of insulin: Secondary | ICD-10-CM | POA: Diagnosis not present

## 2018-10-31 DIAGNOSIS — F331 Major depressive disorder, recurrent, moderate: Secondary | ICD-10-CM | POA: Diagnosis not present

## 2018-10-31 DIAGNOSIS — E1140 Type 2 diabetes mellitus with diabetic neuropathy, unspecified: Secondary | ICD-10-CM | POA: Diagnosis not present

## 2018-10-31 MED ORDER — SODIUM HYALURONATE (VISCOSUP) 20 MG/2ML IX SOSY
20.0000 mg | PREFILLED_SYRINGE | INTRA_ARTICULAR | Status: AC | PRN
  Administered 2018-10-31: 20 mg via INTRA_ARTICULAR

## 2018-10-31 MED ORDER — LIDOCAINE HCL 1 % IJ SOLN
1.5000 mL | INTRAMUSCULAR | Status: AC | PRN
  Administered 2018-10-31: 1.5 mL

## 2018-10-31 NOTE — Progress Notes (Signed)
   Procedure Note  Patient: Katherine Delgado             Date of Birth: [birth year]           MRN: [id number]             Visit Date: 10/31/2018  Procedures: Visit Diagnoses: Primary osteoarthritis of both knees Hyalgan #2 bilateral knee joint injections  Large Joint Inj: bilateral knee on 10/31/2018 12:24 PM Indications: pain Details: 27 G 1.5 in needle, medial approach  Arthrogram: No  Medications (Right): 20 mg Sodium Hyaluronate 20 MG/2ML; 1.5 mL lidocaine 1 % Aspirate (Right): 0 mL Medications (Left): 20 mg Sodium Hyaluronate 20 MG/2ML; 1.5 mL lidocaine 1 % Aspirate (Left): 0 mL Outcome: tolerated well, no immediate complications Procedure, treatment alternatives, risks and benefits explained, specific risks discussed. Consent was given by the patient. Immediately prior to procedure a time out was called to verify the correct patient, procedure, equipment, support staff and site/side marked as required. Patient was prepped and draped in the usual sterile fashion.    Patient tolerated the procedure well.  [name], PA-C

## 2018-11-06 DIAGNOSIS — I10 Essential (primary) hypertension: Secondary | ICD-10-CM | POA: Diagnosis not present

## 2018-11-06 DIAGNOSIS — F331 Major depressive disorder, recurrent, moderate: Secondary | ICD-10-CM | POA: Diagnosis not present

## 2018-11-06 DIAGNOSIS — T8140XD Infection following a procedure, unspecified, subsequent encounter: Secondary | ICD-10-CM | POA: Diagnosis not present

## 2018-11-06 DIAGNOSIS — E1165 Type 2 diabetes mellitus with hyperglycemia: Secondary | ICD-10-CM | POA: Diagnosis not present

## 2018-11-06 DIAGNOSIS — M0589 Other rheumatoid arthritis with rheumatoid factor of multiple sites: Secondary | ICD-10-CM | POA: Diagnosis not present

## 2018-11-06 DIAGNOSIS — T8131XD Disruption of external operation (surgical) wound, not elsewhere classified, subsequent encounter: Secondary | ICD-10-CM | POA: Diagnosis not present

## 2018-11-07 ENCOUNTER — Ambulatory Visit (INDEPENDENT_AMBULATORY_CARE_PROVIDER_SITE_OTHER): Payer: Medicare Other | Admitting: Physician Assistant

## 2018-11-07 ENCOUNTER — Other Ambulatory Visit: Payer: Self-pay

## 2018-11-07 DIAGNOSIS — M170 Bilateral primary osteoarthritis of knee: Secondary | ICD-10-CM | POA: Diagnosis not present

## 2018-11-07 MED ORDER — SODIUM HYALURONATE (VISCOSUP) 20 MG/2ML IX SOSY
20.0000 mg | PREFILLED_SYRINGE | INTRA_ARTICULAR | Status: AC | PRN
  Administered 2018-11-07: 20 mg via INTRA_ARTICULAR

## 2018-11-07 MED ORDER — LIDOCAINE HCL 1 % IJ SOLN
1.5000 mL | INTRAMUSCULAR | Status: AC | PRN
  Administered 2018-11-07: 1.5 mL

## 2018-11-07 NOTE — Progress Notes (Signed)
   Procedure Note  Patient: KAMSIYOCHUKWU BUIST             Date of Birth: 27-Jul-1941           MRN: [id number]             Visit Date: 11/07/2018  Procedures: Visit Diagnoses: Primary osteoarthritis of both knees - Plan: Large Joint Inj: bilateral knee Hyalgan #3 Bilateral knee joint injections B/B Large Joint Inj: bilateral knee on 11/07/2018 8:34 AM Indications: pain Details: 27 G 1.5 in needle, medial approach  Arthrogram: No  Medications (Right): 20 mg Sodium Hyaluronate 20 MG/2ML; 1.5 mL lidocaine 1 % Aspirate (Right): 0 mL Medications (Left): 20 mg Sodium Hyaluronate 20 MG/2ML; 1.5 mL lidocaine 1 % Aspirate (Left): 0 mL Outcome: tolerated well, no immediate complications Procedure, treatment alternatives, risks and benefits explained, specific risks discussed. Consent was given by the patient. Immediately prior to procedure a time out was called to verify the correct patient, procedure, equipment, support staff and site/side marked as required. Patient was prepped and draped in the usual sterile fashion.     Patient tolerated the procedure well.  [name], PA-C

## 2018-11-13 DIAGNOSIS — T8131XD Disruption of external operation (surgical) wound, not elsewhere classified, subsequent encounter: Secondary | ICD-10-CM | POA: Diagnosis not present

## 2018-11-13 DIAGNOSIS — M0589 Other rheumatoid arthritis with rheumatoid factor of multiple sites: Secondary | ICD-10-CM | POA: Diagnosis not present

## 2018-11-13 DIAGNOSIS — E1165 Type 2 diabetes mellitus with hyperglycemia: Secondary | ICD-10-CM | POA: Diagnosis not present

## 2018-11-13 DIAGNOSIS — I10 Essential (primary) hypertension: Secondary | ICD-10-CM | POA: Diagnosis not present

## 2018-11-13 DIAGNOSIS — F331 Major depressive disorder, recurrent, moderate: Secondary | ICD-10-CM | POA: Diagnosis not present

## 2018-11-13 DIAGNOSIS — T8140XD Infection following a procedure, unspecified, subsequent encounter: Secondary | ICD-10-CM | POA: Diagnosis not present

## 2018-11-14 ENCOUNTER — Ambulatory Visit (INDEPENDENT_AMBULATORY_CARE_PROVIDER_SITE_OTHER): Payer: Medicare Other | Admitting: Physician Assistant

## 2018-11-14 ENCOUNTER — Other Ambulatory Visit: Payer: Self-pay

## 2018-11-14 DIAGNOSIS — M25562 Pain in left knee: Secondary | ICD-10-CM | POA: Diagnosis not present

## 2018-11-14 DIAGNOSIS — M170 Bilateral primary osteoarthritis of knee: Secondary | ICD-10-CM | POA: Diagnosis not present

## 2018-11-14 DIAGNOSIS — M25561 Pain in right knee: Secondary | ICD-10-CM

## 2018-11-14 MED ORDER — SODIUM HYALURONATE (VISCOSUP) 20 MG/2ML IX SOSY
20.0000 mg | PREFILLED_SYRINGE | INTRA_ARTICULAR | Status: AC | PRN
  Administered 2018-11-14: 20 mg via INTRA_ARTICULAR

## 2018-11-14 MED ORDER — LIDOCAINE HCL 1 % IJ SOLN
1.5000 mL | INTRAMUSCULAR | Status: AC | PRN
  Administered 2018-11-14: 1.5 mL

## 2018-11-14 NOTE — Progress Notes (Signed)
   Procedure Note  Patient: Katherine Delgado             Date of Birth: 17-May-1941           MRN: [id number]             Visit Date: 11/14/2018  Procedures: Visit Diagnoses: Primary osteoarthritis of both knees - Plan: Large Joint Inj: bilateral knee Hyalgan #4 bilateral knee joint injections  Large Joint Inj: bilateral knee on 11/14/2018 9:12 AM Indications: pain Details: 27 G 1.5 in needle, medial approach  Arthrogram: No  Medications (Right): 1.5 mL lidocaine 1 %; 20 mg Sodium Hyaluronate 20 MG/2ML Aspirate (Right): 0 mL Medications (Left): 1.5 mL lidocaine 1 %; 20 mg Sodium Hyaluronate 20 MG/2ML Aspirate (Left): 0 mL Outcome: tolerated well, no immediate complications Procedure, treatment alternatives, risks and benefits explained, specific risks discussed. Consent was given by the patient. Immediately prior to procedure a time out was called to verify the correct patient, procedure, equipment, support staff and site/side marked as required. Patient was prepped and draped in the usual sterile fashion.    Patient tolerated the procedure well.  [name], PA-C

## 2018-11-20 ENCOUNTER — Telehealth: Payer: Self-pay | Admitting: Plastic Surgery

## 2018-11-20 DIAGNOSIS — T8140XD Infection following a procedure, unspecified, subsequent encounter: Secondary | ICD-10-CM | POA: Diagnosis not present

## 2018-11-20 DIAGNOSIS — M0589 Other rheumatoid arthritis with rheumatoid factor of multiple sites: Secondary | ICD-10-CM | POA: Diagnosis not present

## 2018-11-20 DIAGNOSIS — T8131XD Disruption of external operation (surgical) wound, not elsewhere classified, subsequent encounter: Secondary | ICD-10-CM | POA: Diagnosis not present

## 2018-11-20 DIAGNOSIS — E1165 Type 2 diabetes mellitus with hyperglycemia: Secondary | ICD-10-CM | POA: Diagnosis not present

## 2018-11-20 DIAGNOSIS — I10 Essential (primary) hypertension: Secondary | ICD-10-CM | POA: Diagnosis not present

## 2018-11-20 DIAGNOSIS — F331 Major depressive disorder, recurrent, moderate: Secondary | ICD-10-CM | POA: Diagnosis not present

## 2018-11-20 NOTE — Telephone Encounter (Signed)
Received a call from Dorian Pod, RN at [facility]. She requested a physical therapy order for the patient as she is having difficulty walking due to knee and back pain. Requested a call back on [phone].

## 2018-11-21 ENCOUNTER — Ambulatory Visit: Payer: Medicare Other | Admitting: Physician Assistant

## 2018-11-21 ENCOUNTER — Telehealth: Payer: Self-pay | Admitting: Plastic Surgery

## 2018-11-21 ENCOUNTER — Other Ambulatory Visit: Payer: Self-pay

## 2018-11-21 ENCOUNTER — Ambulatory Visit (INDEPENDENT_AMBULATORY_CARE_PROVIDER_SITE_OTHER): Payer: Medicare Other | Admitting: Rheumatology

## 2018-11-21 ENCOUNTER — Encounter: Payer: Self-pay | Admitting: Rheumatology

## 2018-11-21 ENCOUNTER — Other Ambulatory Visit: Payer: Self-pay | Admitting: *Deleted

## 2018-11-21 VITALS — BP 123/57 | HR 66 | Resp 15 | Ht 66.0 in | Wt 236.0 lb

## 2018-11-21 DIAGNOSIS — Z8639 Personal history of other endocrine, nutritional and metabolic disease: Secondary | ICD-10-CM | POA: Diagnosis not present

## 2018-11-21 DIAGNOSIS — M797 Fibromyalgia: Secondary | ICD-10-CM | POA: Diagnosis not present

## 2018-11-21 DIAGNOSIS — M810 Age-related osteoporosis without current pathological fracture: Secondary | ICD-10-CM

## 2018-11-21 DIAGNOSIS — Z79899 Other long term (current) drug therapy: Secondary | ICD-10-CM | POA: Diagnosis not present

## 2018-11-21 DIAGNOSIS — G8929 Other chronic pain: Secondary | ICD-10-CM

## 2018-11-21 DIAGNOSIS — Z8709 Personal history of other diseases of the respiratory system: Secondary | ICD-10-CM | POA: Diagnosis not present

## 2018-11-21 DIAGNOSIS — G4709 Other insomnia: Secondary | ICD-10-CM | POA: Diagnosis not present

## 2018-11-21 DIAGNOSIS — Z8659 Personal history of other mental and behavioral disorders: Secondary | ICD-10-CM | POA: Diagnosis not present

## 2018-11-21 DIAGNOSIS — Z8669 Personal history of other diseases of the nervous system and sense organs: Secondary | ICD-10-CM

## 2018-11-21 DIAGNOSIS — M3501 Sicca syndrome with keratoconjunctivitis: Secondary | ICD-10-CM

## 2018-11-21 DIAGNOSIS — M5441 Lumbago with sciatica, right side: Principal | ICD-10-CM

## 2018-11-21 DIAGNOSIS — Z8679 Personal history of other diseases of the circulatory system: Secondary | ICD-10-CM

## 2018-11-21 DIAGNOSIS — M5136 Other intervertebral disc degeneration, lumbar region: Secondary | ICD-10-CM | POA: Diagnosis not present

## 2018-11-21 DIAGNOSIS — M170 Bilateral primary osteoarthritis of knee: Secondary | ICD-10-CM

## 2018-11-21 DIAGNOSIS — M0579 Rheumatoid arthritis with rheumatoid factor of multiple sites without organ or systems involvement: Secondary | ICD-10-CM | POA: Diagnosis not present

## 2018-11-21 DIAGNOSIS — Z8719 Personal history of other diseases of the digestive system: Secondary | ICD-10-CM

## 2018-11-21 MED ORDER — HYDROXYCHLOROQUINE SULFATE 200 MG PO TABS: 200.0000 mg | ORAL_TABLET | Freq: Two times a day (BID) | ORAL | 0 refills | Status: DC

## 2018-11-21 MED ORDER — OXYCODONE HCL 5 MG PO TABS: 5.0000 mg | ORAL_TABLET | Freq: Four times a day (QID) | ORAL | 0 refills | Status: DC | PRN

## 2018-11-21 MED ORDER — LIDOCAINE HCL 1 % IJ SOLN
1.5000 mL | INTRAMUSCULAR | Status: AC | PRN
  Administered 2018-11-21: 09:00:00 1.5 mL

## 2018-11-21 MED ORDER — SODIUM HYALURONATE (VISCOSUP) 20 MG/2ML IX SOSY
20.0000 mg | PREFILLED_SYRINGE | INTRA_ARTICULAR | Status: AC | PRN
  Administered 2018-11-21: 20 mg via INTRA_ARTICULAR

## 2018-11-21 MED ORDER — LIDOCAINE HCL 1 % IJ SOLN
1.5000 mL | INTRAMUSCULAR | Status: AC | PRN
  Administered 2018-11-21: 1.5 mL

## 2018-11-21 MED ORDER — SODIUM HYALURONATE (VISCOSUP) 20 MG/2ML IX SOSY
20.0000 mg | PREFILLED_SYRINGE | INTRA_ARTICULAR | Status: AC | PRN
  Administered 2018-11-21: 09:00:00 20 mg via INTRA_ARTICULAR

## 2018-11-21 NOTE — Telephone Encounter (Signed)
Left voicemail for Dorian Pod, RN from [facility] to let her know that the physical therapy order she requested from our office yesterday should be directed to patient's orthopedic provider per Dr. Marla Roe.

## 2018-11-21 NOTE — Progress Notes (Signed)
Office Visit Note  Patient: Katherine Delgado             Date of Birth: [birth year]           MRN: [id number]             PCP: Gayland Curry, DO Referring: Gayland Curry, DO Visit Date: 11/21/2018 Occupation: @GUAROCC @  Subjective:  Pain in multiple joints   History of Present Illness: Katherine Delgado is a 78 y.o. female with history of seropositive rheumatoid arthritis, Sjogren's, osteoarthritis, fibromyalgia, and DDD.  She is not on any treatment for RA at this time due to recurrent infections.  She reports pain in both hands, right shoulder joint, and bilateral knee joints.  She has noticed improvement with hyalgan injections.  Today is her last hyalgan injection of the series.  She reports intermittent swelling in both hands and both knee joints.  She has noticed decreased grip strength and fine motor movements.  She reports she will be starting physical therapy for bilateral knee joint OA. She states her fibromyalgia has been flaring more frequently.  She has generalized muscle aches and muscle tenderness.  She states the weather changes cause increased myalgias.  She reports her lower back pain has resolved.    She is taking calcium and vitamin D.    Activities of Daily Living:  Patient reports morning stiffness for 3-4 hours.   Patient Reports nocturnal pain.  Difficulty dressing/grooming: Denies Difficulty climbing stairs: Reports Difficulty getting out of chair: Reports Difficulty using hands for taps, buttons, cutlery, and/or writing: Reports  Review of Systems  Constitutional: Positive for fatigue.  HENT: Positive for mouth dryness. Negative for mouth sores and nose dryness.   Eyes: Positive for dryness. Negative for pain and visual disturbance.  Respiratory: Negative for cough, hemoptysis, shortness of breath and difficulty breathing.   Cardiovascular: Negative for chest pain, palpitations, hypertension and swelling in legs/feet.  Gastrointestinal: Negative for blood in  stool, constipation and diarrhea.  Endocrine: Negative for increased urination.  Genitourinary: Positive for involuntary urination. Negative for painful urination.  Musculoskeletal: Positive for arthralgias, joint pain, joint swelling, myalgias, morning stiffness, muscle tenderness and myalgias. Negative for muscle weakness.  Skin: Negative for color change, pallor, rash, hair loss, nodules/bumps, skin tightness, ulcers and sensitivity to sunlight.  Allergic/Immunologic: Negative for susceptible to infections.  Neurological: Negative for dizziness, numbness, headaches and weakness.  Hematological: Negative for swollen glands.  Psychiatric/Behavioral: Positive for sleep disturbance. Negative for depressed mood. The patient is not nervous/anxious.     PMFS History:  Patient Active Problem List   Diagnosis Date Noted   Mild episode of recurrent major depressive disorder ([place]) 10/22/2018   Wound dehiscence 08/17/2018   Lumbar spinal stenosis 03/30/2018   Ulcer of toe of left foot, with necrosis of bone ([place]) 07/25/2016   Rheumatoid arthritis involving both wrists with positive rheumatoid factor ([place]) 06/14/2016   High risk medication use 06/14/2016   Sjogren's syndrome ([place]) 06/14/2016   Primary osteoarthritis of both knees 06/14/2016   DDD (degenerative disc disease), lumbar 06/14/2016   Hypothyroidism 06/14/2016   Osteoporosis 06/14/2016   Abnormality of gait 04/19/2016   Diabetes mellitus type 2 with peripheral artery disease ([place]) 11/27/2015   Diabetes mellitus without complication ([place]) 76/28/3151   Headache(784.0) 07/31/2013   Sinus infection 07/31/2013   Chronic low back pain 03/14/2013   Insomnia 12/06/2012   Nausea alone 12/06/2012   Diarrhea 12/06/2012   Urinary incontinence, urge 12/06/2012   Lumbosacral root  lesions, not elsewhere classified 11/27/2012   Diabetic polyneuropathy associated with type 2 diabetes mellitus ([place]) 11/27/2012   EDEMA  05/04/2010   YEAST INFECTION 05/03/2010   Hyperlipidemia 05/03/2010   DEPRESSION 05/03/2010   History of peripheral neuropathy 05/03/2010   Essential hypertension 05/03/2010   ALLERGIC RHINITIS 05/03/2010   PNEUMONIA 05/03/2010   COPD (chronic obstructive pulmonary disease) ([place]) 05/03/2010   GERD 05/03/2010   Fibromyalgia 05/03/2010   DYSPNEA 05/03/2010   CHEST PAIN 05/03/2010    Past Medical History:  Diagnosis Date   Abnormality of gait 04/19/2016   Acute infective polyneuritis ([place]) 2002   Allergic rhinitis due to pollen    Chronic pain syndrome    COPD (chronic obstructive pulmonary disease) ([facility])    chronic bronchitis   Depressive disorder, not elsewhere classified    Diabetes mellitus without complication ([place])    Diaphragmatic hernia without mention of obstruction or gangrene    Dyslipidemia    Fibromyalgia    GERD (gastroesophageal reflux disease)    with h/o esophagitis   Guillain-Barre syndrome (H[facility])    History of benign thymus tumor    Insomnia, unspecified    Lumbar spinal stenosis 03/30/2018   L4-5 level, severe   Miscarriage 1962   Mixed hyperlipidemia    Morbid obesity ([place])    Osteoporosis    Pneumonia    Polyneuropathy in diabetes(357.2)    Rheumatoid arthritis(714.0)    Spondylosis, lumbosacral    Spontaneous ecchymoses    Type II or unspecified type diabetes mellitus with peripheral circulatory disorders, uncontrolled(250.72)    Unspecified chronic bronchitis (H[facility])    Unspecified essential hypertension    Unspecified hypothyroidism    Unspecified pruritic disorder    Unspecified urinary incontinence     Family History  Problem Relation Age of Onset   Alzheimer's disease Mother    Heart disease Mother    Heart disease Father    Liver disease Father    Cancer Brother    Arthritis Son    Colon polyps Brother    Colon cancer Neg Hx    Esophageal cancer Neg Hx    Kidney disease Neg Hx     Stomach cancer Neg Hx    Rectal cancer Neg Hx    Past Surgical History:  Procedure Laterality Date   ABDOMINAL HYSTERECTOMY  1974   abdominal tumor  2002   APPENDECTOMY     APPLICATION OF A-CELL OF BACK N/A 09/26/2018   Procedure: With Acell;  Surgeon: Wallace Going, DO;  Location: [place];  Service: Plastics;  Laterality: N/A;   APPLICATION OF WOUND VAC N/A 09/26/2018   Procedure: Vac placement;  Surgeon: Wallace Going, DO;  Location: [place];  Service: Plastics;  Laterality: N/A;   BACK SURGERY  1982   BRONCHOSCOPY  2001   CHOLECYSTECTOMY  1984   INCISION AND DRAINAGE OF WOUND N/A 09/26/2018   Procedure: Debridement of spine wound;  Surgeon: Wallace Going, DO;  Location: [place];  Service: Plastics;  Laterality: N/A;   KNEE SURGERY Bilateral 08/27/2010 (L) and 01/11/2011 (R)   LUMBAR LAMINECTOMY/DECOMPRESSION MICRODISCECTOMY N/A 07/03/2018   Procedure: Lumbar Three to Lumbar Five Laminectomy;  Surgeon: Judith Part, MD;  Location: [place];  Service: Neurosurgery;  Laterality: N/A;   LUMBAR WOUND DEBRIDEMENT N/A 08/20/2018   Procedure: POSTERIOR LUMBAR SPINAL WOUND DEBRIDEMENT AND REVISION;  Surgeon: Judith Part, MD;  Location: [place];  Service: Neurosurgery;  Laterality: N/A;  POSTERIOR LUMBAR SPINAL WOUND REVISION  miscarrage  1962   OVARIAN CYST SURGERY  1968   thymus tumor     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   TONSILLECTOMY     Social History   Social History Narrative   Walks with cane   Right handed    Caffeine use: Coffee (2 cups every morning)   Tea: sometimes   Soda: none   Immunization History  Administered Date(s) Administered   Influenza-Unspecified 06/10/2011   Pneumococcal Conjugate-13 07/07/2016   Pneumococcal Polysaccharide-23 11/21/2003, 10/05/2017   Tdap 05/28/2016     Objective: Vital Signs: BP (!) 123/57 (BP Location: Right Arm, Patient Position: Sitting, Cuff Size: Normal)    Pulse 66    Resp 15    Ht 5\' 6"  (1.676  m)    Wt 236 lb (107 kg)    BMI 38.09 kg/m    Physical Exam Vitals signs and nursing note reviewed.  Constitutional:      Appearance: She is well-developed.  HENT:     Head: Normocephalic and atraumatic.  Eyes:     Conjunctiva/sclera: Conjunctivae normal.  Neck:     Musculoskeletal: Normal range of motion.  Cardiovascular:     Rate and Rhythm: Normal rate and regular rhythm.     Heart sounds: Normal heart sounds.  Pulmonary:     Effort: Pulmonary effort is normal.     Breath sounds: Normal breath sounds.  Abdominal:     General: Bowel sounds are normal.     Palpations: Abdomen is soft.  Lymphadenopathy:     Cervical: No cervical adenopathy.  Skin:    General: Skin is warm and dry.     Capillary Refill: Capillary refill takes less than 2 seconds.  Neurological:     Mental Status: She is alert and oriented to person, place, and time.  Psychiatric:        Behavior: Behavior normal.      Musculoskeletal Exam: Patient in wheelchair during history and exam. C-spine limited ROM.  Thoracic kyphosis noted.  Shoulder joints and elbow joints have good ROM.  Right shoulder discomfort with ROM.  Limited FOM of left wrist.  Synovial thickening of both wrist joints but no synovitis.  No synovitis of MCPS or PIPs. PIP and DIP synovial thickening.  Bilaterally CMC joint synovial thickening.  Warmth of right knee joint but no effusion noted.  Pedal edema bilaterally.    CDAI Exam: CDAI Score: 1.2  Patient Global Assessment: 7 (mm); Provider Global Assessment: 5 (mm) Swollen: 0 ; Tender: 0  Joint Exam   Not documented   There is currently no information documented on the homunculus. Go to the Rheumatology activity and complete the homunculus joint exam.  Investigation: No additional findings.  Imaging: No results found.  Recent Labs: Lab Results  Component Value Date   WBC 16.8 (H) 09/26/2018   HGB 13.9 09/26/2018   PLT 387 09/26/2018   NA 141 09/26/2018   K 3.7 09/26/2018    CL 102 09/26/2018   CO2 24 09/26/2018   GLUCOSE 153 (H) 09/26/2018   BUN 22 09/26/2018   CREATININE 0.94 09/26/2018   BILITOT 0.4 08/09/2018   ALKPHOS 113 02/20/2017   AST 17 08/09/2018   ALT 9 08/09/2018   PROT 6.5 08/09/2018   ALBUMIN 2.6 (L) 08/22/2018   CALCIUM 9.8 09/26/2018   GFRAA >60 09/26/2018    Speciality Comments: No specialty comments available.  Procedures:  Hyalgan #5 bilateral knees B/B Large Joint Inj: bilateral knee on 11/21/2018 8:36 AM Indications: pain Details:  27 G 1.5 in needle, medial approach  Arthrogram: No  Medications (Right): 20 mg Sodium Hyaluronate 20 MG/2ML; 1.5 mL lidocaine 1 % Aspirate (Right): 0 mL Medications (Left): 20 mg Sodium Hyaluronate 20 MG/2ML; 1.5 mL lidocaine 1 % Aspirate (Left): 0 mL Outcome: tolerated well, no immediate complications Procedure, treatment alternatives, risks and benefits explained, specific risks discussed. Consent was given by the patient. Immediately prior to procedure a time out was called to verify the correct patient, procedure, equipment, support staff and site/side marked as required. Patient was prepped and draped in the usual sterile fashion.     Allergies: Influenza vaccines; Penicillins; Statins; and Sulfamethoxazole-trimethoprim   Assessment / Plan:     Visit Diagnoses: Rheumatoid arthritis involving multiple sites with positive rheumatoid factor ([place]): She has no synovitis on exam today.  She is not on any immunosuppressive agents at this time.  She was previously on Xeljanz 5 mg BID and MTX, but she was advised to discontinue due to recurrent infections.  She has been having increased pain in multiple joints including bilateral hands, right shoulder joint, and bilateral knee joints.  No warmth or effusion of right shoulder joint on exam.  She would like to restart on Xeljanz due to feeling that it was very effective in the past but due to her not having synovitis and having a high susceptibility for  infection we do not feel she is a good candidate for xeljanz at this time.  We discussed starting her on PLQ since it has less immunosuppressive effects.  Indications, contraindications, and potential side effects of PLQ were discussed.  She will be started on PLQ 200 mg BID.  She will notify us if she develops any side effects.  She will follow up in 3 months.   Patient was counseled on the purpose, proper use, and adverse effects of hydroxychloroquine including nausea/diarrhea, skin rash, headaches, and sun sensitivity.  Discussed importance of annual eye exams while on hydroxychloroquine to monitor to ocular toxicity and discussed importance of frequent laboratory monitoring.  Provided patient with eye exam form for baseline ophthalmologic exam.  Provided patient with educational materials on hydroxychloroquine and answered all questions.  Patient consented to hydroxychloroquine.  Will upload consent in the media tab.    Dose will be Plaquenil 200 mg twice daily.   Sjogren's syndrome with keratoconjunctivitis sicca ([place]): She continues to have chronic sicca symptoms.  She was encouraged to use OTC products.   High risk medication use - d/c MTX and Xeljanz due to recurrent infections.  Due to her suspetibilty to infections and the current situation with the coronavirus pandemic, we will not restart her on xeljanz or MTX at this time.  She has no obvious synovitis on exam.  We will start her on PLQ 200 mg by mouth BID.  She will schedule a baseline PLQ eye exam.  She will return for lab work in 1 month, 3 months, then every 5 months.   Primary osteoarthritis of both knees -Chronic pain.  She has right knee warmth.  No effusion noted.  She presented today for her 5th hyalgan injection of the series.  She tolerated the procedure well.  Procedure note completed above. Plan: Large Joint Inj: bilateral knee  Fibromyalgia: She has generalized muscle aches and muscle tenderness due to fibromyalgia.  She has  generalized hyperalgesia on exam.  She has trapezius muscle tension.  She continues to have chronic fatigue and insomnia.  She has difficulty falling asleep due to the discomfort she  experiences.  Good sleep hygiene was discussed.   DDD (degenerative disc disease), lumbar: Her lower back pain has resolved.   Age-related osteoporosis without current pathological fracture: She is taking a calcium and vitamin D supplement.   Other insomnia: She has difficulty falling asleep and staying asleep due to the discomfort she experiences at night.  Good sleep hygiene was discussed.  Other medical conditions are listed as follows:   History of Guillain-Barre syndrome  History of depression  History of hypertension  History of COPD  History of peripheral neuropathy  History of diabetes mellitus  History of gastroesophageal reflux (GERD)   Orders: Orders Placed This Encounter  Procedures   Large Joint Inj: bilateral knee   CBC with Differential/Platelet   COMPLETE METABOLIC PANEL WITH GFR   Meds ordered this encounter  Medications   hydroxychloroquine (PLAQUENIL) 200 MG tablet    Sig: Take 1 tablet (200 mg total) by mouth 2 (two) times daily.    Dispense:  180 tablet    Refill:  0    Face-to-face time spent with patient was 30 minutes. Greater than 50% of time was spent in counseling and coordination of care.  Follow-Up Instructions: Return in about 3 months (around 02/20/2019) for Rheumatoid arthritis, Sjogren's syndrome, Osteoarthritis, Fibromyalgia.   Ofilia Neas, PA-C   I examined and evaluated the patient with [name] PA.  Patient had joint tenderness but no synovitis on examination.  The plan of care was discussed as noted above.  Bo Merino, MD  Note - This record has been created using Editor, commissioning.  Chart creation errors have been sought, but may not always  have been located. Such creation errors do not reflect on  the standard of medical care.

## 2018-11-21 NOTE — Telephone Encounter (Signed)
Patient requested refill [place] Database Verified LR: 10/24/2018 Pended Rx and sent to Dr. Lyndel Safe for approval. (Dr. Mariea Clonts off)

## 2018-11-21 NOTE — Patient Instructions (Signed)
Hydroxychloroquine tablets What is this medicine? HYDROXYCHLOROQUINE (hye drox ee KLOR oh kwin) is used to treat rheumatoid arthritis and systemic lupus erythematosus. It is also used to treat malaria. This medicine may be used for other purposes; ask your health care provider or pharmacist if you have questions. COMMON BRAND NAME(S): Plaquenil, Quineprox What should I tell my health care provider before I take this medicine? They need to know if you have any of these conditions: -diabetes -eye disease, vision problems -G6PD deficiency -history of blood diseases -history of irregular heartbeat -if you often drink alcohol -kidney disease -liver disease -porphyria -psoriasis -seizures -an unusual or allergic reaction to chloroquine, hydroxychloroquine, other medicines, foods, dyes, or preservatives -pregnant or trying to get pregnant -breast-feeding How should I use this medicine? Take this medicine by mouth with a glass of water. Follow the directions on the prescription label. Avoid taking antacids within 4 hours of taking this medicine. It is best to separate these medicines by at least 4 hours. Do not cut, crush or chew this medicine. You can take it with or without food. If it upsets your stomach, take it with food. Take your medicine at regular intervals. Do not take your medicine more often than directed. Take all of your medicine as directed even if you think you are better. Do not skip doses or stop your medicine early. Talk to your pediatrician regarding the use of this medicine in children. While this drug may be prescribed for selected conditions, precautions do apply. Overdosage: If you think you have taken too much of this medicine contact a poison control center or emergency room at once. NOTE: This medicine is only for you. Do not share this medicine with others. What if I miss a dose? If you miss a dose, take it as soon as you can. If it is almost time for your next dose,  take only that dose. Do not take double or extra doses. What may interact with this medicine? Do not take this medicine with any of the following medications: -cisapride -dofetilide -dronedarone -live virus vaccines -penicillamine -pimozide -thioridazine -ziprasidone This medicine may also interact with the following medications: -ampicillin -antacids -cimetidine -cyclosporine -digoxin -medicines for diabetes, like insulin, glipizide, glyburide -medicines for seizures like carbamazepine, phenobarbital, phenytoin -mefloquine -methotrexate -other medicines that prolong the QT interval (cause an abnormal heart rhythm) -praziquantel This list may not describe all possible interactions. Give your health care provider a list of all the medicines, herbs, non-prescription drugs, or dietary supplements you use. Also tell them if you smoke, drink alcohol, or use illegal drugs. Some items may interact with your medicine. What should I watch for while using this medicine? Tell your doctor or healthcare professional if your symptoms do not start to get better or if they get worse. Avoid taking antacids within 4 hours of taking this medicine. It is best to separate these medicines by at least 4 hours. Tell your doctor or health care professional right away if you have any change in your eyesight. Your vision and blood may be tested before and during use of this medicine. This medicine can make you more sensitive to the sun. Keep out of the sun. If you cannot avoid being in the sun, wear protective clothing and use sunscreen. Do not use sun lamps or tanning beds/booths. What side effects may I notice from receiving this medicine? Side effects that you should report to your doctor or health care professional as soon as possible: -allergic reactions like skin rash,   the sun. If you cannot avoid being in the sun, wear protective clothing and use sunscreen. Do not use sun lamps or tanning beds/booths.  What side effects may I notice from receiving this medicine?  Side effects that you should report to your doctor or health care professional as soon as possible:  -allergic reactions like skin rash, itching or hives, swelling of the face, lips, or tongue  -changes in vision  -decreased hearing or ringing of the ears  -redness,  blistering, peeling or loosening of the skin, including inside the mouth  -seizures  -sensitivity to light  -signs and symptoms of a dangerous change in heartbeat or heart rhythm like chest pain; dizziness; fast or irregular heartbeat; palpitations; feeling faint or lightheaded, falls; breathing problems  -signs and symptoms of liver injury like dark yellow or brown urine; general ill feeling or flu-like symptoms; light-colored stools; loss of appetite; nausea; right upper belly pain; unusually weak or tired; yellowing of the eyes or skin  -signs and symptoms of low blood sugar such as feeling anxious; confusion; dizziness; increased hunger; unusually weak or tired; sweating; shakiness; cold; irritable; headache; blurred vision; fast heartbeat; loss of consciousness  -uncontrollable head, mouth, neck, arm, or leg movements  Side effects that usually do not require medical attention (report to your doctor or health care professional if they continue or are bothersome):  -anxious  -diarrhea  -dizziness  -hair loss  -headache  -irritable  -loss of appetite  -nausea, vomiting  -stomach pain  This list may not describe all possible side effects. Call your doctor for medical advice about side effects. You may report side effects to FDA at 1-800-FDA-1088.  Where should I keep my medicine?  Keep out of the reach of children. In children, this medicine can cause overdose with small doses.  Store at room temperature between 15 and 30 degrees C (59 and 86 degrees F). Protect from moisture and light. Throw away any unused medicine after the expiration date.  NOTE: This sheet is a summary. It may not cover all possible information. If you have questions about this medicine, talk to your doctor, pharmacist, or health care provider.   2019 Elsevier/Gold Standard (2016-03-23 14:16:15)

## 2018-11-21 NOTE — Telephone Encounter (Signed)
See telephone message on (11/21/18).//AB/CMA

## 2018-11-26 DIAGNOSIS — M0589 Other rheumatoid arthritis with rheumatoid factor of multiple sites: Secondary | ICD-10-CM | POA: Diagnosis not present

## 2018-11-26 DIAGNOSIS — I10 Essential (primary) hypertension: Secondary | ICD-10-CM | POA: Diagnosis not present

## 2018-11-26 DIAGNOSIS — T8140XD Infection following a procedure, unspecified, subsequent encounter: Secondary | ICD-10-CM | POA: Diagnosis not present

## 2018-11-26 DIAGNOSIS — E1165 Type 2 diabetes mellitus with hyperglycemia: Secondary | ICD-10-CM | POA: Diagnosis not present

## 2018-11-26 DIAGNOSIS — F331 Major depressive disorder, recurrent, moderate: Secondary | ICD-10-CM | POA: Diagnosis not present

## 2018-11-26 DIAGNOSIS — T8131XD Disruption of external operation (surgical) wound, not elsewhere classified, subsequent encounter: Secondary | ICD-10-CM | POA: Diagnosis not present

## 2018-11-27 ENCOUNTER — Ambulatory Visit: Payer: Medicare Other | Admitting: Physician Assistant

## 2018-11-27 DIAGNOSIS — T8140XD Infection following a procedure, unspecified, subsequent encounter: Secondary | ICD-10-CM | POA: Diagnosis not present

## 2018-11-27 DIAGNOSIS — E1165 Type 2 diabetes mellitus with hyperglycemia: Secondary | ICD-10-CM | POA: Diagnosis not present

## 2018-11-27 DIAGNOSIS — T8131XD Disruption of external operation (surgical) wound, not elsewhere classified, subsequent encounter: Secondary | ICD-10-CM | POA: Diagnosis not present

## 2018-11-27 DIAGNOSIS — M0589 Other rheumatoid arthritis with rheumatoid factor of multiple sites: Secondary | ICD-10-CM | POA: Diagnosis not present

## 2018-11-27 DIAGNOSIS — I10 Essential (primary) hypertension: Secondary | ICD-10-CM | POA: Diagnosis not present

## 2018-11-27 DIAGNOSIS — F331 Major depressive disorder, recurrent, moderate: Secondary | ICD-10-CM | POA: Diagnosis not present

## 2018-11-30 DIAGNOSIS — R413 Other amnesia: Secondary | ICD-10-CM | POA: Diagnosis not present

## 2018-11-30 DIAGNOSIS — F331 Major depressive disorder, recurrent, moderate: Secondary | ICD-10-CM | POA: Diagnosis not present

## 2018-11-30 DIAGNOSIS — Z794 Long term (current) use of insulin: Secondary | ICD-10-CM | POA: Diagnosis not present

## 2018-11-30 DIAGNOSIS — I10 Essential (primary) hypertension: Secondary | ICD-10-CM | POA: Diagnosis not present

## 2018-11-30 DIAGNOSIS — T8131XD Disruption of external operation (surgical) wound, not elsewhere classified, subsequent encounter: Secondary | ICD-10-CM | POA: Diagnosis not present

## 2018-11-30 DIAGNOSIS — Z6841 Body Mass Index (BMI) 40.0 and over, adult: Secondary | ICD-10-CM | POA: Diagnosis not present

## 2018-11-30 DIAGNOSIS — M6281 Muscle weakness (generalized): Secondary | ICD-10-CM | POA: Diagnosis not present

## 2018-11-30 DIAGNOSIS — M0589 Other rheumatoid arthritis with rheumatoid factor of multiple sites: Secondary | ICD-10-CM | POA: Diagnosis not present

## 2018-11-30 DIAGNOSIS — E1140 Type 2 diabetes mellitus with diabetic neuropathy, unspecified: Secondary | ICD-10-CM | POA: Diagnosis not present

## 2018-11-30 DIAGNOSIS — R2689 Other abnormalities of gait and mobility: Secondary | ICD-10-CM | POA: Diagnosis not present

## 2018-12-03 DIAGNOSIS — F331 Major depressive disorder, recurrent, moderate: Secondary | ICD-10-CM | POA: Diagnosis not present

## 2018-12-03 DIAGNOSIS — E1140 Type 2 diabetes mellitus with diabetic neuropathy, unspecified: Secondary | ICD-10-CM | POA: Diagnosis not present

## 2018-12-03 DIAGNOSIS — Z794 Long term (current) use of insulin: Secondary | ICD-10-CM | POA: Diagnosis not present

## 2018-12-03 DIAGNOSIS — T8131XD Disruption of external operation (surgical) wound, not elsewhere classified, subsequent encounter: Secondary | ICD-10-CM | POA: Diagnosis not present

## 2018-12-03 DIAGNOSIS — I10 Essential (primary) hypertension: Secondary | ICD-10-CM | POA: Diagnosis not present

## 2018-12-03 DIAGNOSIS — M0589 Other rheumatoid arthritis with rheumatoid factor of multiple sites: Secondary | ICD-10-CM | POA: Diagnosis not present

## 2018-12-05 DIAGNOSIS — I10 Essential (primary) hypertension: Secondary | ICD-10-CM | POA: Diagnosis not present

## 2018-12-05 DIAGNOSIS — Z794 Long term (current) use of insulin: Secondary | ICD-10-CM | POA: Diagnosis not present

## 2018-12-05 DIAGNOSIS — F331 Major depressive disorder, recurrent, moderate: Secondary | ICD-10-CM | POA: Diagnosis not present

## 2018-12-05 DIAGNOSIS — E1140 Type 2 diabetes mellitus with diabetic neuropathy, unspecified: Secondary | ICD-10-CM | POA: Diagnosis not present

## 2018-12-05 DIAGNOSIS — T8131XD Disruption of external operation (surgical) wound, not elsewhere classified, subsequent encounter: Secondary | ICD-10-CM | POA: Diagnosis not present

## 2018-12-05 DIAGNOSIS — M0589 Other rheumatoid arthritis with rheumatoid factor of multiple sites: Secondary | ICD-10-CM | POA: Diagnosis not present

## 2018-12-06 DIAGNOSIS — M0589 Other rheumatoid arthritis with rheumatoid factor of multiple sites: Secondary | ICD-10-CM | POA: Diagnosis not present

## 2018-12-06 DIAGNOSIS — I10 Essential (primary) hypertension: Secondary | ICD-10-CM | POA: Diagnosis not present

## 2018-12-06 DIAGNOSIS — T8131XD Disruption of external operation (surgical) wound, not elsewhere classified, subsequent encounter: Secondary | ICD-10-CM | POA: Diagnosis not present

## 2018-12-06 DIAGNOSIS — Z794 Long term (current) use of insulin: Secondary | ICD-10-CM | POA: Diagnosis not present

## 2018-12-06 DIAGNOSIS — E1140 Type 2 diabetes mellitus with diabetic neuropathy, unspecified: Secondary | ICD-10-CM | POA: Diagnosis not present

## 2018-12-06 DIAGNOSIS — F331 Major depressive disorder, recurrent, moderate: Secondary | ICD-10-CM | POA: Diagnosis not present

## 2018-12-07 ENCOUNTER — Other Ambulatory Visit: Payer: Self-pay

## 2018-12-07 ENCOUNTER — Other Ambulatory Visit: Payer: Medicare Other

## 2018-12-07 DIAGNOSIS — Z794 Long term (current) use of insulin: Secondary | ICD-10-CM

## 2018-12-07 DIAGNOSIS — E1142 Type 2 diabetes mellitus with diabetic polyneuropathy: Secondary | ICD-10-CM | POA: Diagnosis not present

## 2018-12-07 DIAGNOSIS — E039 Hypothyroidism, unspecified: Secondary | ICD-10-CM

## 2018-12-07 DIAGNOSIS — E785 Hyperlipidemia, unspecified: Secondary | ICD-10-CM | POA: Diagnosis not present

## 2018-12-07 DIAGNOSIS — E1169 Type 2 diabetes mellitus with other specified complication: Secondary | ICD-10-CM | POA: Diagnosis not present

## 2018-12-07 DIAGNOSIS — E1151 Type 2 diabetes mellitus with diabetic peripheral angiopathy without gangrene: Secondary | ICD-10-CM

## 2018-12-08 LAB — LIPID PANEL
Cholesterol: 142 mg/dL (ref <200)
HDL: 38 mg/dL — ABNORMAL LOW (ref >=50)
LDL Cholesterol (Calc): 83 mg/dL (calc)
Non-HDL Cholesterol (Calc): 104 mg/dL (calc) (ref <130)
Total CHOL/HDL Ratio: 3.7 (calc) (ref <5.0)
Triglycerides: 118 mg/dL (ref <150)

## 2018-12-08 LAB — COMPLETE METABOLIC PANEL WITH GFR
AG Ratio: 1.4 (calc) (ref 1.0–2.5)
ALT: 10 U/L (ref 6–29)
AST: 14 U/L (ref 10–35)
Albumin: 4 g/dL (ref 3.6–5.1)
Alkaline phosphatase (APISO): 111 U/L (ref 37–153)
BUN: 16 mg/dL (ref 7–25)
CO2: 32 mmol/L (ref 20–32)
Calcium: 9.6 mg/dL (ref 8.6–10.4)
Chloride: 104 mmol/L (ref 98–110)
Creat: 0.91 mg/dL (ref 0.60–0.93)
GFR, Est African American: 71 mL/min/{1.73_m2} (ref >=60)
GFR, Est Non African American: 61 mL/min/{1.73_m2} (ref >=60)
Globulin: 2.9 g/dL (calc) (ref 1.9–3.7)
Glucose, Bld: 116 mg/dL — ABNORMAL HIGH (ref 65–99)
Potassium: 4.1 mmol/L (ref 3.5–5.3)
Sodium: 143 mmol/L (ref 135–146)
Total Bilirubin: 0.4 mg/dL (ref 0.2–1.2)
Total Protein: 6.9 g/dL (ref 6.1–8.1)

## 2018-12-08 LAB — HEMOGLOBIN A1C
Hgb A1c MFr Bld: 7.4 % of total Hgb — ABNORMAL HIGH (ref <5.7)
Mean Plasma Glucose: 166 (calc)
eAG (mmol/L): 9.2 (calc)

## 2018-12-08 LAB — CBC WITH DIFFERENTIAL/PLATELET
Absolute Monocytes: 787 cells/uL (ref 200–950)
Basophils Absolute: 116 cells/uL (ref 0–200)
Basophils Relative: 0.9 %
Eosinophils Absolute: 503 cells/uL — ABNORMAL HIGH (ref 15–500)
Eosinophils Relative: 3.9 %
HCT: 43.4 % (ref 35.0–45.0)
Hemoglobin: 14 g/dL (ref 11.7–15.5)
Lymphs Abs: 1974 cells/uL (ref 850–3900)
MCH: 26.1 pg — ABNORMAL LOW (ref 27.0–33.0)
MCHC: 32.3 g/dL (ref 32.0–36.0)
MCV: 80.8 fL (ref 80.0–100.0)
MPV: 10.8 fL (ref 7.5–12.5)
Monocytes Relative: 6.1 %
Neutro Abs: 9520 cells/uL — ABNORMAL HIGH (ref 1500–7800)
Neutrophils Relative %: 73.8 %
Platelets: 339 10*3/uL (ref 140–400)
RBC: 5.37 10*6/uL — ABNORMAL HIGH (ref 3.80–5.10)
RDW: 13.1 % (ref 11.0–15.0)
Total Lymphocyte: 15.3 %
WBC: 12.9 10*3/uL — ABNORMAL HIGH (ref 3.8–10.8)

## 2018-12-08 LAB — TSH: TSH: 3.75 mIU/L (ref 0.40–4.50)

## 2018-12-10 ENCOUNTER — Ambulatory Visit: Payer: Medicare Other | Admitting: Internal Medicine

## 2018-12-10 ENCOUNTER — Telehealth: Payer: Self-pay | Admitting: Rheumatology

## 2018-12-10 ENCOUNTER — Ambulatory Visit (INDEPENDENT_AMBULATORY_CARE_PROVIDER_SITE_OTHER): Payer: Medicare Other | Admitting: Internal Medicine

## 2018-12-10 ENCOUNTER — Encounter: Payer: Self-pay | Admitting: Internal Medicine

## 2018-12-10 ENCOUNTER — Other Ambulatory Visit: Payer: Self-pay

## 2018-12-10 DIAGNOSIS — G8929 Other chronic pain: Secondary | ICD-10-CM | POA: Diagnosis not present

## 2018-12-10 DIAGNOSIS — M150 Primary generalized (osteo)arthritis: Secondary | ICD-10-CM | POA: Diagnosis not present

## 2018-12-10 DIAGNOSIS — E1169 Type 2 diabetes mellitus with other specified complication: Secondary | ICD-10-CM

## 2018-12-10 DIAGNOSIS — Z794 Long term (current) use of insulin: Secondary | ICD-10-CM | POA: Diagnosis not present

## 2018-12-10 DIAGNOSIS — E039 Hypothyroidism, unspecified: Secondary | ICD-10-CM

## 2018-12-10 DIAGNOSIS — K1120 Sialoadenitis, unspecified: Secondary | ICD-10-CM | POA: Diagnosis not present

## 2018-12-10 DIAGNOSIS — M5441 Lumbago with sciatica, right side: Secondary | ICD-10-CM | POA: Diagnosis not present

## 2018-12-10 DIAGNOSIS — M159 Polyosteoarthritis, unspecified: Secondary | ICD-10-CM

## 2018-12-10 DIAGNOSIS — E1142 Type 2 diabetes mellitus with diabetic polyneuropathy: Secondary | ICD-10-CM | POA: Diagnosis not present

## 2018-12-10 DIAGNOSIS — E785 Hyperlipidemia, unspecified: Secondary | ICD-10-CM

## 2018-12-10 DIAGNOSIS — I10 Essential (primary) hypertension: Secondary | ICD-10-CM | POA: Diagnosis not present

## 2018-12-10 DIAGNOSIS — M8949 Other hypertrophic osteoarthropathy, multiple sites: Secondary | ICD-10-CM

## 2018-12-10 MED ORDER — FOLIC ACID 1 MG PO TABS: 2.0000 mg | ORAL_TABLET | Freq: Every day | ORAL | 1 refills | Status: DC

## 2018-12-10 MED ORDER — TRIAMTERENE-HCTZ 37.5-25 MG PO TABS: 1.0000 | ORAL_TABLET | Freq: Every day | ORAL | 1 refills | Status: DC

## 2018-12-10 NOTE — Progress Notes (Signed)
Patient ID: Katherine Delgado, female   DOB: 04-Oct-1940, 78 y.o.   MRN: [id number] This service is provided via telemedicine  No vital signs collected/recorded due to the encounter was a telemedicine visit.   Location of patient (ex: home, work):  home  Patient consents to a telephone visit:  yes  Location of the provider (ex: office, home):  office  Name of any referring provider:  Dr. Hollace Kinnier, DO  Names of all persons participating in the telemedicine service and their role in the encounter:  Patient, Edwin Dada, CMA, Dr. Hollace Kinnier, DO  Time spent on call:  5 minutes    Provider:  Ojani Berenson L. Mariea Clonts, D.O., C.M.D.  Goals of Care:  Advanced Directives 08/17/2018  Does Patient Have a Medical Advance Directive? Yes;No  Type of Advance Directive Living will  Does patient want to make changes to medical advance directive? No - Patient declined  Copy of [place] in Chart? -  Would patient like information on creating a medical advance directive? No - Patient declined  Pre-existing out of facility DNR order (yellow form or pink MOST form) -   Chief Complaint  Patient presents with  . telephone visit    6 week follow-up    HPI: Patient is a 78 y.o. female evaluated by telephone visit.    Dr. Estanislado Pandy has prescribed her hydroxychloroquine now for her arthritis.  BPs have been high and she has headaches.  First thing this morning it was 180/94; 196/86; 199/100; 208/84 consistently high at various times of the day.  Home health nurse also is aware of the bp problem.    Right ear parotid glands have swollen up again.  Her neck hurts.  Ear feels full and stopped up.  She went to an ENT about this before.  No drainage from the ear.  No fever.  Says they decided she couldn't get surgery on it at the time.  WBC only 12.9.  Has historically been up to 17-18 in the past.  Toe and back are both cleared up.  Off wound vac for her back for a few weeks.  Incision has  healed.  She did have a hematology eval which indicated she did not need further workup from their perspective unless WBC trended up beyond those highest levels of 17/18.  Has only a few more weeks of PT, OT at home.  Is using tylenol.    Says she has nothing for her neuropathy and her arthritis.  Says her 5mg  oxycodones don't touch it.  Had hyalgan x 5 with Dr. Estanislado Pandy for knees.    She is not eating more prepared foods.  She's been trying to do higher protein diet and vitamins.  Not increased sodium in her diet.    TSH was in normal range.    Lipids:  LDL 83.  Goal is less than 70.  Statins caused both transaminitis and myopathy.    Sugar average was 7.4.  Was 6.6 in dec.  Has trended up.  She's not sure why there.  Maybe more fruits.    Asks about medical CBD.    She has used up her oxycodones.  She is not due until early May.    Past Medical History:  Diagnosis Date  . Abnormality of gait 04/19/2016  . Acute infective polyneuritis ([place]) 2002  . Allergic rhinitis due to pollen   . Chronic pain syndrome   . COPD (chronic obstructive pulmonary disease) (HCC)    chronic  bronchitis  . Depressive disorder, not elsewhere classified   . Diabetes mellitus without complication ([place])   . Diaphragmatic hernia without mention of obstruction or gangrene   . Dyslipidemia   . Fibromyalgia   . GERD (gastroesophageal reflux disease)    with h/o esophagitis  . Guillain-Barre syndrome ([place])   . History of benign thymus tumor   . Insomnia, unspecified   . Lumbar spinal stenosis 03/30/2018   L4-5 level, severe  . Miscarriage 1962  . Mixed hyperlipidemia   . Morbid obesity ([place])   . Osteoporosis   . Pneumonia   . Polyneuropathy in diabetes(357.2)   . Rheumatoid arthritis(714.0)   . Spondylosis, lumbosacral   . Spontaneous ecchymoses   . Type II or unspecified type diabetes mellitus with peripheral circulatory disorders, uncontrolled(250.72)   . Unspecified chronic bronchitis ([place])   .  Unspecified essential hypertension   . Unspecified hypothyroidism   . Unspecified pruritic disorder   . Unspecified urinary incontinence     Past Surgical History:  Procedure Laterality Date  . ABDOMINAL HYSTERECTOMY  1974  . abdominal tumor  2002  . APPENDECTOMY    . APPLICATION OF A-CELL OF BACK N/A 09/26/2018   Procedure: With Acell;  Surgeon: Wallace Going, DO;  Location: [facility];  Service: Plastics;  Laterality: N/A;  . APPLICATION OF WOUND VAC N/A 09/26/2018   Procedure: Vac placement;  Surgeon: Wallace Going, DO;  Location: [facility];  Service: Plastics;  Laterality: N/A;  . [place]  . BRONCHOSCOPY  2001  . CHOLECYSTECTOMY  1984  . INCISION AND DRAINAGE OF WOUND N/A 09/26/2018   Procedure: Debridement of spine wound;  Surgeon: Wallace Going, DO;  Location: [facility];  Service: Plastics;  Laterality: N/A;  . KNEE SURGERY Bilateral 08/27/2010 (L) and 01/11/2011 (R)  . LUMBAR LAMINECTOMY/DECOMPRESSION MICRODISCECTOMY N/A 07/03/2018   Procedure: Lumbar Three to Lumbar Five Laminectomy;  Surgeon: Judith Part, MD;  Location: [facility];  Service: Neurosurgery;  Laterality: N/A;  . LUMBAR WOUND DEBRIDEMENT N/A 08/20/2018   Procedure: POSTERIOR LUMBAR SPINAL WOUND DEBRIDEMENT AND REVISION;  Surgeon: Judith Part, MD;  Location: [facility];  Service: Neurosurgery;  Laterality: N/A;  POSTERIOR LUMBAR SPINAL WOUND REVISION  . [place]  . OVARIAN CYST SURGERY  1968  . thymus tumor    . 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 TONSILLECTOMY      Allergies  Allergen Reactions  . Influenza Vaccines Other (See Comments)    "h/o Guillain Barre; dr's told me years ago never to take another flu shot as it could relapse Rosalee Kaufman; has to do with when vaccine being changed to H1N1 virus"  . Penicillins Hives, Itching, Swelling and Rash    Has patient had a PCN reaction causing immediate rash, facial/tongue/throat swelling, SOB or lightheadedness with hypotension:Unknown Has  patient had a PCN reaction causing severe rash involving mucus membranes or skin necrosis: Unknown Has patient had a PCN reaction that required hospitalization:Unknown Has patient had a PCN reaction occurring within the last 10 years: Unknown If all of the above answers are "NO", then may proceed with Cephalosporin use.   . Statins Other (See Comments)    Myopathy, transaminitis  . Sulfamethoxazole-Trimethoprim Itching    Outpatient Encounter Medications as of 12/10/2018  Medication Sig  . acetaminophen (TYLENOL) 325 MG tablet Take 650 mg by mouth every 6 (six) hours as needed (for pain.).  Marland Kitchen albuterol (PROVENTIL HFA;VENTOLIN HFA) 108 (90 Base) MCG/ACT inhaler Inhale  2 puffs into the lungs every 6 (six) hours as needed for wheezing or shortness of breath.  Marland Kitchen amLODipine (NORVASC) 10 MG tablet Take 1 tablet (10 mg total) by mouth daily. Please schedule appointment for refills.  Marland Kitchen aspirin EC 81 MG tablet Take 81 mg by mouth 2 (two) times a week.  . B-D UF III MINI PEN NEEDLES 31G X 5 MM MISC USE AS DIRECTED  . docusate sodium (COLACE) 100 MG capsule Take 100 mg by mouth daily as needed (constipation.).   Marland Kitchen folic acid (FOLVITE) 1 MG tablet Take 2 tablets (2 mg total) by mouth daily.  . furosemide (LASIX) 20 MG tablet TAKE 1 TABLET BY MOUTH TWICE DAILY AS NEEDED FOR 3 POUND WEIGHT GAIN IN ONE DAY OR 5 POUNDS IN ONE WEEK  . gabapentin (NEURONTIN) 600 MG tablet TAKE 1 TABLET BY MOUTH THREE TIMES DAILY  . HUMALOG KWIKPEN 200 UNIT/ML SOPN INJECT 20 UNITS UNDER THE SKIN THREE TIMES DAILY BEFORE A MEAL  . hydroxychloroquine (PLAQUENIL) 200 MG tablet Take 1 tablet (200 mg total) by mouth 2 (two) times daily.  Marland Kitchen levothyroxine (SYNTHROID, LEVOTHROID) 137 MCG tablet TAKE 1 TABLET BY MOUTH DAILY BEFORE BREAKFAST ON AN EMPTY STOMACH( LABS OVERDUE)  . lisinopril (PRINIVIL,ZESTRIL) 20 MG tablet Take 1 tablet (20 mg total) by mouth daily.  Marland Kitchen loperamide (IMODIUM A-D) 2 MG tablet Take 2 mg by mouth 4 (four) times  daily as needed for diarrhea or loose stools.  . magnesium oxide (MAG-OX) 400 MG tablet Take 400 mg by mouth daily as needed (for leg cramps).   . Melatonin 10 MG TABS Take 10 mg by mouth at bedtime.  . metoprolol tartrate (LOPRESSOR) 50 MG tablet Take 50 mg by mouth 2 (two) times daily.   Marland Kitchen MYRBETRIQ 50 MG TB24 tablet TAKE 1 TABLET(50 MG) BY MOUTH DAILY  . nystatin (NYSTATIN) powder Apply topically daily as needed.  . nystatin cream (MYCOSTATIN) Apply 1 application topically 2 (two) times daily. To skin folds  . omega-3 acid ethyl esters (LOVAZA) 1 g capsule TAKE ONE CAPSULE BY MOUTH EVERY DAY  . oxyCODONE (ROXICODONE) 5 MG immediate release tablet Take 1 tablet (5 mg total) by mouth every 6 (six) hours as needed for severe pain.  . polyvinyl alcohol (LIQUIFILM TEARS) 1.4 % ophthalmic solution Place 1 drop into both eyes 4 (four) times daily as needed (for dry/irritated eyes).   . potassium chloride (MICRO-K) 10 MEQ CR capsule Take 2 capsules (20 mEq total) by mouth 2 (two) times daily as needed (when taking a dose of lasix.).  Marland Kitchen tiZANidine (ZANAFLEX) 2 MG tablet Take 1 tablet (2 mg total) by mouth 3 (three) times daily.  . TOUJEO SOLOSTAR 300 UNIT/ML SOPN INJECT 100 UNITS UNDER THE SKIN EVERY NIGHT AT BEDTIME  . triamterene-hydrochlorothiazide (MAXZIDE-25) 37.5-25 MG tablet Take 1 tablet by mouth daily.  Marland Kitchen umeclidinium-vilanterol (ANORO ELLIPTA) 62.5-25 MCG/INH AEPB Inhale 1 puff into the lungs daily.  Marland Kitchen venlafaxine XR (EFFEXOR-XR) 75 MG 24 hr capsule Take one capsule by mouth once daily with breakfast  . VICTOZA 18 MG/3ML SOPN ADMINISTER 1.8 MG UNDER THE SKIN DAILY FOR BLOOD SUGAR  . [DISCONTINUED] folic acid (FOLVITE) 1 MG tablet Take 2 tablets (2 mg total) by mouth daily.  . [DISCONTINUED] triamterene-hydrochlorothiazide (MAXZIDE-25) 37.5-25 MG tablet TAKE 1 TABLET BY MOUTH DAILY   No facility-administered encounter medications on file as of 12/10/2018.     Review of Systems:  Review of  Systems  Constitutional: Positive for malaise/fatigue.  Negative for chills and fever.  HENT: Negative for hearing loss.   Eyes: Negative for blurred vision.  Respiratory: Negative for cough, shortness of breath and wheezing.   Cardiovascular: Negative for chest pain, palpitations and leg swelling.  Gastrointestinal: Negative for abdominal pain, blood in stool, constipation, diarrhea and melena.  Genitourinary: Negative for dysuria.  Musculoskeletal: Positive for joint pain. Negative for back pain and falls.  Skin: Negative for itching and rash.  Neurological: Positive for tingling, sensory change and headaches. Negative for dizziness and loss of consciousness.  Endo/Heme/Allergies: Bruises/bleeds easily.  Psychiatric/Behavioral: Negative for depression and memory loss. The patient is not nervous/anxious and does not have insomnia.     Health Maintenance  Topic Date Due  . OPHTHALMOLOGY EXAM  12/20/1950  . FOOT EXAM  09/27/2016  . INFLUENZA VACCINE  03/23/2019  . HEMOGLOBIN A1C  06/08/2019  . DEXA SCAN  09/20/2025  . TETANUS/TDAP  05/28/2026  . PNA vac Low Risk Adult  Completed    Physical Exam: Could not be performed as visit non face-to-face via phone   Labs reviewed: Basic Metabolic Panel: Recent Labs    04/06/18 0930  08/09/18 1224  08/22/18 0447 09/26/18 1016 12/07/18 0956  NA 141   < > 142   < > 135 141 143  K 4.8   < > 4.1   < > 4.0 3.7 4.1  CL 103   < > 104   < > 99 102 104  CO2 31   < > 30   < > 28 24 32  GLUCOSE 102*   < > 45*   < > 270* 153* 116*  BUN 18   < > 19   < > 22 22 16   CREATININE 0.93   < > 1.00*   < > 0.93 0.94 0.91  CALCIUM 9.1   < > 9.8   < > 8.5* 9.8 9.6  PHOS  --   --   --   --  2.5  --   --   TSH 0.73  --  0.84  --   --   --  3.75   < > = values in this interval not displayed.   Liver Function Tests: Recent Labs    04/06/18 0930 08/09/18 1224 08/22/18 0447 12/07/18 0956  AST 41* 17  --  14  ALT 18 9  --  10  BILITOT 0.4 0.4  --   0.4  PROT 6.3 6.5  --  6.9  ALBUMIN  --   --  2.6*  --    No results for input(s): LIPASE, AMYLASE in the last 8760 hours. No results for input(s): AMMONIA in the last 8760 hours. CBC: Recent Labs    04/06/18 0930  08/09/18 1224 08/17/18 1817 09/26/18 1016 12/07/18 0956  WBC 14.5*   < > 17.2* 13.3* 16.8* 12.9*  NEUTROABS 10,701*  --  12,264*  --   --  9,520*  HGB 13.2   < > 13.9 12.9 13.9 14.0  HCT 39.7   < > 42.9 41.8 45.6 43.4  MCV 83.8   < > 84.8 87.1 85.6 80.8  PLT 346   < > 430* 292 387 339   < > = values in this interval not displayed.   Lipid Panel: Recent Labs    04/06/18 0930 08/09/18 1224 12/07/18 0956  CHOL 129 159 142  HDL 29* 30* 38*  LDLCALC 75 97 83  TRIG 145 219* 118  CHOLHDL 4.4 5.3* 3.7  Lab Results  Component Value Date   HGBA1C 7.4 (H) 12/07/2018    Assessment/Plan 1. Uncontrolled hypertension -pt reports this just began since starting plaquenil therapy -I don't see htn in the typical side effects -I've reached out to Dr. Estanislado Pandy about this and asked the patient to call her office and let her know, as well -in the interim, cont same bp meds, avoid high sodium foods and those with a lot of preservatives -if the plaquenil is stopped and bps remain high, will need to adjust her medications appropriately and she is to call me back  2. Parotitis -suspect based on symptoms she reports and history of the same--given this is a tele-call, I'm not entirely certain -pt wants to see the ENT she saw about this in the past--she does not have fever so abx are not likely to be indicated in this instance -I did not want to bring her here in the office when she likely needs to also see the ENT anyway -she's looking in her paperwork from long ago about who she saw before  3. Type 2 diabetes mellitus with diabetic polyneuropathy, with long-term current use of insulin (HCC) -control has worsened recently which may be related to her possible parotitis  4.  Chronic right-sided low back pain with right-sided sciatica -s/p lumbar surgery with improvement of those symptoms (not that wound dehiscence and prior infections resolved)  5. Primary osteoarthritis involving multiple joints -ongoing, she has struggled with this for years -getting joint injections with hyalgan through Dr. Estanislado Pandy -has been on chronic opioid therapy, unfortunately, and I've been trying to taper her off since she had her back surgery and her other pains are being managed by other means; however, she remains resistant to this saying nothing else works--she does use tylenol therapy and is getting PT  6. Acquired hypothyroidism -tsh has been therapeutic despite her feeling "crummy" with current levothyroxine  7. Hyperlipidemia associated with type 2 diabetes mellitus ([place]) -did not tolerate statin therapy due to myopathy and also due to transaminitis -does take fish oil but we have not sent it in for her since 2017 so I'm not sure how she's gotten it  Labs/tests ordered:   Orders Placed This Encounter  Procedures  . CBC with Differential/Platelet    Standing Status:   Future    Standing Expiration Date:   12/10/2019  . COMPLETE METABOLIC PANEL WITH GFR    Standing Status:   Future    Standing Expiration Date:   12/10/2019  . Hemoglobin A1c    Standing Status:   Future    Standing Expiration Date:   12/10/2019    Next appt:  04/11/2019  Non face-to-face time spent on televisit:  23 minutes  Jossue Rubenstein L. Sherwin Hollingshed, D.O. [facility] [address],  [address] Cell Phone (Mon-Fri 8am-5pm):  [phone] On Call:  [phone] & follow prompts after 5pm & weekends Office Phone:  [phone] Office Fax:  [phone]

## 2018-12-10 NOTE — Patient Instructions (Signed)
Please contact Dr. Estanislado Pandy since you note a correlation between your high blood pressures and the new plaquenil.    If your bp remains high after stopping that medication, then please call me back so we can adjust your medications for your blood pressure accordingly.  Avoid high sodium foods and preservatives that will increase your blood pressure.  Let me know which ENT doctor you saw several years ago about your swollen parotid glands or mastoiditis.  I'm happy to refer you accordingly.

## 2018-12-10 NOTE — Telephone Encounter (Signed)
I called patient and discussed that hypertension is not a side effect of Plaquenil.  She was advised to exercise on a regular basis and reduce salt intake. Katherine Merino, MD

## 2018-12-11 DIAGNOSIS — M0589 Other rheumatoid arthritis with rheumatoid factor of multiple sites: Secondary | ICD-10-CM | POA: Diagnosis not present

## 2018-12-11 DIAGNOSIS — E1140 Type 2 diabetes mellitus with diabetic neuropathy, unspecified: Secondary | ICD-10-CM | POA: Diagnosis not present

## 2018-12-11 DIAGNOSIS — F331 Major depressive disorder, recurrent, moderate: Secondary | ICD-10-CM | POA: Diagnosis not present

## 2018-12-11 DIAGNOSIS — Z794 Long term (current) use of insulin: Secondary | ICD-10-CM | POA: Diagnosis not present

## 2018-12-11 DIAGNOSIS — T8131XD Disruption of external operation (surgical) wound, not elsewhere classified, subsequent encounter: Secondary | ICD-10-CM | POA: Diagnosis not present

## 2018-12-11 DIAGNOSIS — I10 Essential (primary) hypertension: Secondary | ICD-10-CM | POA: Diagnosis not present

## 2018-12-13 DIAGNOSIS — Z794 Long term (current) use of insulin: Secondary | ICD-10-CM | POA: Diagnosis not present

## 2018-12-13 DIAGNOSIS — T8131XD Disruption of external operation (surgical) wound, not elsewhere classified, subsequent encounter: Secondary | ICD-10-CM | POA: Diagnosis not present

## 2018-12-13 DIAGNOSIS — E1140 Type 2 diabetes mellitus with diabetic neuropathy, unspecified: Secondary | ICD-10-CM | POA: Diagnosis not present

## 2018-12-13 DIAGNOSIS — I10 Essential (primary) hypertension: Secondary | ICD-10-CM | POA: Diagnosis not present

## 2018-12-13 DIAGNOSIS — F331 Major depressive disorder, recurrent, moderate: Secondary | ICD-10-CM | POA: Diagnosis not present

## 2018-12-13 DIAGNOSIS — M0589 Other rheumatoid arthritis with rheumatoid factor of multiple sites: Secondary | ICD-10-CM | POA: Diagnosis not present

## 2018-12-14 DIAGNOSIS — F331 Major depressive disorder, recurrent, moderate: Secondary | ICD-10-CM | POA: Diagnosis not present

## 2018-12-14 DIAGNOSIS — I10 Essential (primary) hypertension: Secondary | ICD-10-CM | POA: Diagnosis not present

## 2018-12-14 DIAGNOSIS — M0589 Other rheumatoid arthritis with rheumatoid factor of multiple sites: Secondary | ICD-10-CM | POA: Diagnosis not present

## 2018-12-14 DIAGNOSIS — Z794 Long term (current) use of insulin: Secondary | ICD-10-CM | POA: Diagnosis not present

## 2018-12-14 DIAGNOSIS — T8131XD Disruption of external operation (surgical) wound, not elsewhere classified, subsequent encounter: Secondary | ICD-10-CM | POA: Diagnosis not present

## 2018-12-14 DIAGNOSIS — E1140 Type 2 diabetes mellitus with diabetic neuropathy, unspecified: Secondary | ICD-10-CM | POA: Diagnosis not present

## 2018-12-17 DIAGNOSIS — M0589 Other rheumatoid arthritis with rheumatoid factor of multiple sites: Secondary | ICD-10-CM | POA: Diagnosis not present

## 2018-12-17 DIAGNOSIS — Z794 Long term (current) use of insulin: Secondary | ICD-10-CM | POA: Diagnosis not present

## 2018-12-17 DIAGNOSIS — E1140 Type 2 diabetes mellitus with diabetic neuropathy, unspecified: Secondary | ICD-10-CM | POA: Diagnosis not present

## 2018-12-17 DIAGNOSIS — T8131XD Disruption of external operation (surgical) wound, not elsewhere classified, subsequent encounter: Secondary | ICD-10-CM | POA: Diagnosis not present

## 2018-12-17 DIAGNOSIS — I10 Essential (primary) hypertension: Secondary | ICD-10-CM | POA: Diagnosis not present

## 2018-12-17 DIAGNOSIS — F331 Major depressive disorder, recurrent, moderate: Secondary | ICD-10-CM | POA: Diagnosis not present

## 2018-12-20 ENCOUNTER — Other Ambulatory Visit: Payer: Self-pay | Admitting: *Deleted

## 2018-12-20 ENCOUNTER — Telehealth: Payer: Self-pay | Admitting: *Deleted

## 2018-12-20 DIAGNOSIS — M0589 Other rheumatoid arthritis with rheumatoid factor of multiple sites: Secondary | ICD-10-CM | POA: Diagnosis not present

## 2018-12-20 DIAGNOSIS — Z794 Long term (current) use of insulin: Secondary | ICD-10-CM | POA: Diagnosis not present

## 2018-12-20 DIAGNOSIS — F331 Major depressive disorder, recurrent, moderate: Secondary | ICD-10-CM | POA: Diagnosis not present

## 2018-12-20 DIAGNOSIS — I10 Essential (primary) hypertension: Secondary | ICD-10-CM | POA: Diagnosis not present

## 2018-12-20 DIAGNOSIS — T8131XD Disruption of external operation (surgical) wound, not elsewhere classified, subsequent encounter: Secondary | ICD-10-CM | POA: Diagnosis not present

## 2018-12-20 DIAGNOSIS — M5441 Lumbago with sciatica, right side: Principal | ICD-10-CM

## 2018-12-20 DIAGNOSIS — G8929 Other chronic pain: Secondary | ICD-10-CM

## 2018-12-20 DIAGNOSIS — E1140 Type 2 diabetes mellitus with diabetic neuropathy, unspecified: Secondary | ICD-10-CM | POA: Diagnosis not present

## 2018-12-20 MED ORDER — OXYCODONE HCL 5 MG PO TABS: 5.0000 mg | ORAL_TABLET | Freq: Four times a day (QID) | ORAL | 0 refills | Status: DC | PRN

## 2018-12-20 NOTE — Telephone Encounter (Signed)
Katherine Delgado, nurse with [facility] called and stated that patient has had weight gain. Last week she was 236 and this week 242. No increase in edema. Vital signs normal. [facility] extended the nursing to monitor her Weight. Please Advise.

## 2018-12-20 NOTE — Telephone Encounter (Signed)
Dorian Pod with [facility] notified and she will discuss this with patient.

## 2018-12-20 NOTE — Telephone Encounter (Signed)
Patient requested refill [place] Database Verified LR: 11/21/2018 Pended Rx and sent to Dr. Mariea Clonts for approval.

## 2018-12-20 NOTE — Telephone Encounter (Signed)
Her lasix order indicates she should take her 20mg  pill twice a day for the weight gain.  I recommend she do this for 3 days.  She should also take the potassium for 3 days as in her med list.  I wonder if [facility] has the accurate list?

## 2018-12-21 ENCOUNTER — Other Ambulatory Visit: Payer: Self-pay | Admitting: Internal Medicine

## 2018-12-24 DIAGNOSIS — T8131XD Disruption of external operation (surgical) wound, not elsewhere classified, subsequent encounter: Secondary | ICD-10-CM | POA: Diagnosis not present

## 2018-12-24 DIAGNOSIS — E1140 Type 2 diabetes mellitus with diabetic neuropathy, unspecified: Secondary | ICD-10-CM | POA: Diagnosis not present

## 2018-12-24 DIAGNOSIS — Z794 Long term (current) use of insulin: Secondary | ICD-10-CM | POA: Diagnosis not present

## 2018-12-24 DIAGNOSIS — I10 Essential (primary) hypertension: Secondary | ICD-10-CM | POA: Diagnosis not present

## 2018-12-24 DIAGNOSIS — F331 Major depressive disorder, recurrent, moderate: Secondary | ICD-10-CM | POA: Diagnosis not present

## 2018-12-24 DIAGNOSIS — M0589 Other rheumatoid arthritis with rheumatoid factor of multiple sites: Secondary | ICD-10-CM | POA: Diagnosis not present

## 2018-12-25 ENCOUNTER — Telehealth: Payer: Self-pay | Admitting: *Deleted

## 2018-12-25 NOTE — Telephone Encounter (Signed)
Almirah with [facility] called requesting verbal orders to continue PT. Verbal orders given per office protocol.

## 2018-12-26 DIAGNOSIS — I10 Essential (primary) hypertension: Secondary | ICD-10-CM | POA: Diagnosis not present

## 2018-12-26 DIAGNOSIS — E1140 Type 2 diabetes mellitus with diabetic neuropathy, unspecified: Secondary | ICD-10-CM | POA: Diagnosis not present

## 2018-12-26 DIAGNOSIS — T8131XD Disruption of external operation (surgical) wound, not elsewhere classified, subsequent encounter: Secondary | ICD-10-CM | POA: Diagnosis not present

## 2018-12-26 DIAGNOSIS — Z794 Long term (current) use of insulin: Secondary | ICD-10-CM | POA: Diagnosis not present

## 2018-12-26 DIAGNOSIS — M0589 Other rheumatoid arthritis with rheumatoid factor of multiple sites: Secondary | ICD-10-CM | POA: Diagnosis not present

## 2018-12-26 DIAGNOSIS — F331 Major depressive disorder, recurrent, moderate: Secondary | ICD-10-CM | POA: Diagnosis not present

## 2018-12-27 ENCOUNTER — Telehealth: Payer: Self-pay | Admitting: *Deleted

## 2018-12-27 ENCOUNTER — Ambulatory Visit: Payer: Self-pay | Admitting: Internal Medicine

## 2018-12-27 NOTE — Telephone Encounter (Signed)
Patient called and stated that she has symptoms of another UTI. Stated that she is Burning with Urination and Frequency. Stated that she DOES NOT want to come and get a cup. Stated that she just wants an antibiotic called in. Patient wants to speak with Dr. Mariea Clonts concerning this. TeleVisit scheduled for today with Dr. Mariea Clonts.

## 2018-12-27 NOTE — Telephone Encounter (Signed)
Spoke with patient and advised results, pt scheduled appt for Thursday 03/05/19

## 2018-12-27 NOTE — Telephone Encounter (Signed)
She really needs a pelvic exam b/c she has had this over and over and over.  Something is amiss.  I want to check for atrophic vaginitis.

## 2018-12-28 DIAGNOSIS — T8131XD Disruption of external operation (surgical) wound, not elsewhere classified, subsequent encounter: Secondary | ICD-10-CM | POA: Diagnosis not present

## 2018-12-28 DIAGNOSIS — F331 Major depressive disorder, recurrent, moderate: Secondary | ICD-10-CM | POA: Diagnosis not present

## 2018-12-28 DIAGNOSIS — Z794 Long term (current) use of insulin: Secondary | ICD-10-CM | POA: Diagnosis not present

## 2018-12-28 DIAGNOSIS — M0589 Other rheumatoid arthritis with rheumatoid factor of multiple sites: Secondary | ICD-10-CM | POA: Diagnosis not present

## 2018-12-28 DIAGNOSIS — E1140 Type 2 diabetes mellitus with diabetic neuropathy, unspecified: Secondary | ICD-10-CM | POA: Diagnosis not present

## 2018-12-28 DIAGNOSIS — I10 Essential (primary) hypertension: Secondary | ICD-10-CM | POA: Diagnosis not present

## 2018-12-30 DIAGNOSIS — M6281 Muscle weakness (generalized): Secondary | ICD-10-CM | POA: Diagnosis not present

## 2018-12-30 DIAGNOSIS — E1140 Type 2 diabetes mellitus with diabetic neuropathy, unspecified: Secondary | ICD-10-CM | POA: Diagnosis not present

## 2018-12-30 DIAGNOSIS — I10 Essential (primary) hypertension: Secondary | ICD-10-CM | POA: Diagnosis not present

## 2018-12-30 DIAGNOSIS — M0589 Other rheumatoid arthritis with rheumatoid factor of multiple sites: Secondary | ICD-10-CM | POA: Diagnosis not present

## 2018-12-30 DIAGNOSIS — R413 Other amnesia: Secondary | ICD-10-CM | POA: Diagnosis not present

## 2018-12-30 DIAGNOSIS — Z794 Long term (current) use of insulin: Secondary | ICD-10-CM | POA: Diagnosis not present

## 2018-12-30 DIAGNOSIS — F331 Major depressive disorder, recurrent, moderate: Secondary | ICD-10-CM | POA: Diagnosis not present

## 2018-12-30 DIAGNOSIS — T8131XD Disruption of external operation (surgical) wound, not elsewhere classified, subsequent encounter: Secondary | ICD-10-CM | POA: Diagnosis not present

## 2018-12-30 DIAGNOSIS — Z6841 Body Mass Index (BMI) 40.0 and over, adult: Secondary | ICD-10-CM | POA: Diagnosis not present

## 2018-12-30 DIAGNOSIS — R2689 Other abnormalities of gait and mobility: Secondary | ICD-10-CM | POA: Diagnosis not present

## 2018-12-31 DIAGNOSIS — M0589 Other rheumatoid arthritis with rheumatoid factor of multiple sites: Secondary | ICD-10-CM | POA: Diagnosis not present

## 2018-12-31 DIAGNOSIS — F331 Major depressive disorder, recurrent, moderate: Secondary | ICD-10-CM | POA: Diagnosis not present

## 2018-12-31 DIAGNOSIS — Z794 Long term (current) use of insulin: Secondary | ICD-10-CM | POA: Diagnosis not present

## 2018-12-31 DIAGNOSIS — E1140 Type 2 diabetes mellitus with diabetic neuropathy, unspecified: Secondary | ICD-10-CM | POA: Diagnosis not present

## 2018-12-31 DIAGNOSIS — T8131XD Disruption of external operation (surgical) wound, not elsewhere classified, subsequent encounter: Secondary | ICD-10-CM | POA: Diagnosis not present

## 2018-12-31 DIAGNOSIS — I10 Essential (primary) hypertension: Secondary | ICD-10-CM | POA: Diagnosis not present

## 2019-01-01 ENCOUNTER — Telehealth: Payer: Self-pay | Admitting: Rheumatology

## 2019-01-01 NOTE — Progress Notes (Signed)
Office Visit Note  Patient: Katherine Delgado             Date of Birth: 12/25/1940           MRN: [id number]             PCP: Gayland Curry, DO Referring: Gayland Curry, DO Visit Date: 01/04/2019 Occupation: @GUAROCC @  Subjective:  Pain in multiple joints   History of Present Illness: Katherine Delgado is a 78 y.o. female with history of seropositive rheumatoid arthritis, osteoarthritis, and fibromyalgia.  She is taking PLQ 200 mg BID.  She has not missed any doses.  She started on PLQ in early April 2020. She has been taking tylenol and oxycodone for pain relief.  She having increased pain and swelling in both hands.  She is having pain in both shoulder joints and both hips.  Her knee joint are doing better following visco gel injections.  She continues to have generalized muscle aches and muscle tenderness.    Activities of Daily Living:  Patient reports joint stiffness all day Patient Reports nocturnal pain.  Difficulty dressing/grooming: Denies Difficulty climbing stairs: Denies Difficulty getting out of chair: Denies Difficulty using hands for taps, buttons, cutlery, and/or writing: Denies  Review of Systems  Constitutional: Positive for fatigue.  HENT: Positive for mouth dryness. Negative for mouth sores and nose dryness.   Eyes: Positive for dryness. Negative for pain and visual disturbance.  Respiratory: Negative for cough, hemoptysis, shortness of breath and difficulty breathing.   Cardiovascular: Positive for swelling in legs/feet. Negative for chest pain, palpitations and hypertension.  Gastrointestinal: Negative for blood in stool, constipation and diarrhea.  Genitourinary: Positive for involuntary urination. Negative for painful urination.  Musculoskeletal: Positive for arthralgias, joint pain, joint swelling, muscle weakness, morning stiffness and muscle tenderness. Negative for myalgias and myalgias.  Skin: Negative for color change, pallor, rash, hair loss,  nodules/bumps, skin tightness, ulcers and sensitivity to sunlight.  Allergic/Immunologic: Negative for susceptible to infections.  Neurological: Negative for dizziness and headaches.  Hematological: Negative for bruising/bleeding tendency and swollen glands.  Psychiatric/Behavioral: Positive for sleep disturbance. Negative for depressed mood. The patient is not nervous/anxious.     PMFS History:  Patient Active Problem List   Diagnosis Date Noted   Mild episode of recurrent major depressive disorder ([place]) 10/22/2018   Wound dehiscence 08/17/2018   Lumbar spinal stenosis 03/30/2018   Rheumatoid arthritis involving both wrists with positive rheumatoid factor ([place]) 06/14/2016   High risk medication use 06/14/2016   Sjogren's syndrome ([place]) 06/14/2016   Primary osteoarthritis of both knees 06/14/2016   DDD (degenerative disc disease), lumbar 06/14/2016   Hypothyroidism 06/14/2016   Osteoporosis 06/14/2016   Abnormality of gait 04/19/2016   Diabetes mellitus type 2 with peripheral artery disease ([place]) 11/27/2015   Diabetes mellitus without complication ([place]) 56/38/7564   Headache(784.0) 07/31/2013   Sinus infection 07/31/2013   Chronic low back pain 03/14/2013   Insomnia 12/06/2012   Nausea alone 12/06/2012   Diarrhea 12/06/2012   Urinary incontinence, urge 12/06/2012   Lumbosacral root lesions, not elsewhere classified 11/27/2012   Diabetic polyneuropathy associated with type 2 diabetes mellitus ([place]) 11/27/2012   EDEMA 05/04/2010   YEAST INFECTION 05/03/2010   Hyperlipidemia 05/03/2010   DEPRESSION 05/03/2010   History of peripheral neuropathy 05/03/2010   Essential hypertension 05/03/2010   ALLERGIC RHINITIS 05/03/2010   PNEUMONIA 05/03/2010   COPD (chronic obstructive pulmonary disease) ([place]) 05/03/2010   GERD 05/03/2010   Fibromyalgia 05/03/2010  DYSPNEA 05/03/2010   CHEST PAIN 05/03/2010    Past Medical History:  Diagnosis Date    Abnormality of gait 04/19/2016   Acute infective polyneuritis ([place]) 2002   Allergic rhinitis due to pollen    Chronic pain syndrome    COPD (chronic obstructive pulmonary disease) ([facility])    chronic bronchitis   Depressive disorder, not elsewhere classified    Diabetes mellitus without complication ([place])    Diaphragmatic hernia without mention of obstruction or gangrene    Dyslipidemia    Fibromyalgia    GERD (gastroesophageal reflux disease)    with h/o esophagitis   Guillain-Barre syndrome ([facility])    History of benign thymus tumor    Insomnia, unspecified    Lumbar spinal stenosis 03/30/2018   L4-5 level, severe   Miscarriage 1962   Mixed hyperlipidemia    Morbid obesity ([place])    Osteoporosis    Pneumonia    Polyneuropathy in diabetes(357.2)    Rheumatoid arthritis(714.0)    Spondylosis, lumbosacral    Spontaneous ecchymoses    Type II or unspecified type diabetes mellitus with peripheral circulatory disorders, uncontrolled(250.72)    Unspecified chronic bronchitis ([facility])    Unspecified essential hypertension    Unspecified hypothyroidism    Unspecified pruritic disorder    Unspecified urinary incontinence     Family History  Problem Relation Age of Onset   Alzheimer's disease Mother    Heart disease Mother    Heart disease Father    Liver disease Father    Cancer Brother    Arthritis Son    Colon polyps Brother    Colon cancer Neg Hx    Esophageal cancer Neg Hx    Kidney disease Neg Hx    Stomach cancer Neg Hx    Rectal cancer Neg Hx    Past Surgical History:  Procedure Laterality Date   ABDOMINAL HYSTERECTOMY  1974   abdominal tumor  2002   APPENDECTOMY     APPLICATION OF A-CELL OF BACK N/A 09/26/2018   Procedure: With Acell;  Surgeon: Wallace Going, DO;  Location: [facility];  Service: Plastics;  Laterality: N/A;   APPLICATION OF WOUND VAC N/A 09/26/2018   Procedure: Vac placement;  Surgeon: Wallace Going, DO;   Location: [place];  Service: Plastics;  Laterality: N/A;   BACK SURGERY  1982   BRONCHOSCOPY  2001   CHOLECYSTECTOMY  1984   INCISION AND DRAINAGE OF WOUND N/A 09/26/2018   Procedure: Debridement of spine wound;  Surgeon: Wallace Going, DO;  Location: [facility];  Service: Plastics;  Laterality: N/A;   KNEE SURGERY Bilateral 08/27/2010 (L) and 01/11/2011 (R)   LUMBAR LAMINECTOMY/DECOMPRESSION MICRODISCECTOMY N/A 07/03/2018   Procedure: Lumbar Three to Lumbar Five Laminectomy;  Surgeon: Judith Part, MD;  Location: [facility];  Service: Neurosurgery;  Laterality: N/A;   LUMBAR WOUND DEBRIDEMENT N/A 08/20/2018   Procedure: POSTERIOR LUMBAR SPINAL WOUND DEBRIDEMENT AND REVISION;  Surgeon: Judith Part, MD;  Location: [facility];  Service: Neurosurgery;  Laterality: N/A;  POSTERIOR LUMBAR SPINAL WOUND REVISION   miscarrage  1962   OVARIAN CYST SURGERY  1968   thymus tumor     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   TONSILLECTOMY     Social History   Social History Narrative   Walks with cane   Right handed    Caffeine use: Coffee (2 cups every morning)   Tea: sometimes   Soda: none   Immunization History  Administered Date(s) Administered   Influenza-Unspecified  06/10/2011   Pneumococcal Conjugate-13 07/07/2016   Pneumococcal Polysaccharide-23 11/21/2003, 10/05/2017   Tdap 05/28/2016     Objective: Vital Signs: BP 123/62 (BP Location: Left Arm, Patient Position: Sitting, Cuff Size: Normal)    Pulse 91    Resp 18    Ht 5\' 6"  (1.676 m)    Wt 242 lb 3.2 oz (109.9 kg)    BMI 39.09 kg/m    Physical Exam Vitals signs and nursing note reviewed.  Constitutional:      Appearance: She is well-developed.  HENT:     Head: Normocephalic and atraumatic.  Eyes:     Conjunctiva/sclera: Conjunctivae normal.  Neck:     Musculoskeletal: Normal range of motion.  Cardiovascular:     Rate and Rhythm: Normal rate and regular rhythm.     Heart sounds: Normal heart sounds.  Pulmonary:      Effort: Pulmonary effort is normal.     Breath sounds: Normal breath sounds.  Abdominal:     General: Bowel sounds are normal.     Palpations: Abdomen is soft.  Lymphadenopathy:     Cervical: No cervical adenopathy.  Skin:    General: Skin is warm and dry.     Capillary Refill: Capillary refill takes less than 2 seconds.  Neurological:     Mental Status: She is alert and oriented to person, place, and time.  Psychiatric:        Behavior: Behavior normal.      Musculoskeletal Exam: Generalized hyperalgesia and positive tender points on exam. Shoulder joints limited abduction and internal rotation due to discomfort.  Elbow joints good ROM.  Limited ROM of both wrist joints.  Tenderness of the right 5th MCP and PIP joint.  Hip joints good ROM.  Tenderness over bilateral trochanteric bursa.  Left knee mild warmth but no effusion.  No warmth or effusion of right knee joint.  Pedal edema bilaterally.   CDAI Exam: CDAI Score: 1.2  Patient Global Assessment: 6 (mm); Provider Global Assessment: 6 (mm) Swollen: 0 ; Tender: 0  Joint Exam   Not documented   There is currently no information documented on the homunculus. Go to the Rheumatology activity and complete the homunculus joint exam.  Investigation: No additional findings.  Imaging: No results found.  Recent Labs: Lab Results  Component Value Date   WBC 12.9 (H) 12/07/2018   HGB 14.0 12/07/2018   PLT 339 12/07/2018   NA 143 12/07/2018   K 4.1 12/07/2018   CL 104 12/07/2018   CO2 32 12/07/2018   GLUCOSE 116 (H) 12/07/2018   BUN 16 12/07/2018   CREATININE 0.91 12/07/2018   BILITOT 0.4 12/07/2018   ALKPHOS 113 02/20/2017   AST 14 12/07/2018   ALT 10 12/07/2018   PROT 6.9 12/07/2018   ALBUMIN 2.6 (L) 08/22/2018   CALCIUM 9.6 12/07/2018   GFRAA 71 12/07/2018    Speciality Comments: No specialty comments available.  Procedures:  No procedures performed Allergies: Influenza vaccines; Penicillins; Statins; and  Sulfamethoxazole-trimethoprim   Assessment / Plan:     Visit Diagnoses: Rheumatoid arthritis involving multiple sites with positive rheumatoid factor ([place]): She has no active synovitis on exam.  She has not had any recent rheumatoid arthritis flares.  She has generalized hyperalgesia on exam due to fibromyalgia.  She was started on PLQ 200 mg BID at her last visit on 11/21/18.  She has been tolerating Plaquenil without any side effects.  She has not missed any doses.  She continues to have pain  in bilateral hands but no joint swelling.  She has tenderness of the right 5th PIP and MCP joints.  She has been having increased discomfort in bilateral shoulder joints but no effusion noted.   She has a history of recurrent infections while on Xeljanz.  Due to the risk of infection we do not want to restart her on methotrexate or Xeljanz at this time.  We offered to schedule ultrasound of both hands to assess for synovitis but she declined. We will place a referral to a pain clinic.  She will continue taking Plaquenil 20 mg 1 tablet twice daily.  She does not need any refills at this time.  She will follow-up in the office in 3 months.  Sjogren's syndrome with keratoconjunctivitis sicca ([place]): She is chronic sicca symptoms.  She uses eye drops on a daily basis.    High risk medication use - d/c MTX and Xeljanz due to recurrent infections. Started on PLQ 200 mg BID at her last visit 11/21/2018.  Primary osteoarthritis of both knees: She has mild warmth of the left knee joint.  Her knee joint pain is subsided since having Visco gel injections recently.  She continues to use a wheelchair for mobility purposes.  Fibromyalgia: She continues to have frequent fibromyalgia flares.  She has generalized hyperalgesia and positive tender points on exam.  She continues have generalized muscle aches muscle tenderness.  She has chronic fatigue and difficulty sleeping at night due to discomfort she experiences.  She takes Tylenol  and oxycodone as prescribed for pain relief.  She takes Zanaflex 2 mg 3 times daily as needed for muscle spasms. We will refer her to pain management.   DDD (degenerative disc disease), lumbar: She has intermittent lower back pain.  Age-related osteoporosis without current pathological fracture  Other insomnia: She has difficulty sleeping at night due to the discomfort she experiences.    Other medical conditions are listed as follows:  History of Guillain-Barre syndrome  History of depression  History of hypertension  History of COPD  History of peripheral neuropathy  History of diabetes mellitus  History of gastroesophageal reflux (GERD)   Orders: No orders of the defined types were placed in this encounter.  No orders of the defined types were placed in this encounter.   Face-to-face time spent with patient was 25 minutes. Greater than 50% of time was spent in counseling and coordination of care.  Follow-Up Instructions: 3 months.   Ofilia Neas, PA-C   I examined and evaluated the patient with [name] PA.  Patient continues to have multiple arthralgias.  She has severe osteoarthritis and degenerative disc disease which causes chronic pain.  She also has discomfort from underlying fibromyalgia.  She had no synovitis on examination.  She was treated with Morrie Sheldon in the past.  She has been on Plaquenil which she has been taking for last 1 month.  Due to severity of her pain I would refer her to pain management.  The plan of care was discussed as noted above.  Bo Merino, MD  Note - This record has been created using Editor, commissioning.  Chart creation errors have been sought, but may not always  have been located. Such creation errors do not reflect on  the standard of medical care.

## 2019-01-01 NOTE — Telephone Encounter (Signed)
Patient called stating she has a couple of questions and is requesting a return call.

## 2019-01-01 NOTE — Telephone Encounter (Signed)
Patient states she was started on PLQ on 11/21/18. Patient states she wants to know why she could not be started on something like Morrie Sheldon as it had worked so well before. Patient states that she does not feel like the PLQ does not help her. Patient states she is having trouble pain in her shoulders, knees, hips and feet. Patient states she is stiff and sore. Patient states she has noticed swelling in feet and legs. Patient would like to know what she should do. Patient states she is having trouble walking. Patient scheduled for an appointment at 01/04/19 at 10:15 am.

## 2019-01-03 ENCOUNTER — Ambulatory Visit (INDEPENDENT_AMBULATORY_CARE_PROVIDER_SITE_OTHER): Payer: Medicare Other | Admitting: Internal Medicine

## 2019-01-03 ENCOUNTER — Other Ambulatory Visit: Payer: Self-pay

## 2019-01-03 ENCOUNTER — Encounter: Payer: Self-pay | Admitting: Internal Medicine

## 2019-01-03 VITALS — BP 130/60 | HR 95 | Temp 98.4°F | Ht 66.0 in | Wt 243.0 lb

## 2019-01-03 DIAGNOSIS — R300 Dysuria: Secondary | ICD-10-CM | POA: Diagnosis not present

## 2019-01-03 DIAGNOSIS — N390 Urinary tract infection, site not specified: Secondary | ICD-10-CM

## 2019-01-03 LAB — POCT URINALYSIS DIPSTICK
Bilirubin, UA: NEGATIVE
Glucose, UA: NEGATIVE
Ketones, UA: NEGATIVE
Nitrite, UA: NEGATIVE
Protein, UA: NEGATIVE
Spec Grav, UA: <=1.005 — AB (ref 1.010–1.025)
Urobilinogen, UA: 0.2 E.U./dL
pH, UA: 6 (ref 5.0–8.0)

## 2019-01-03 NOTE — Progress Notes (Signed)
Location:  [facility] clinic Provider: Prateek Knipple L. Mariea Clonts, D.O., C.M.D.  Goals of Care:  Advanced Directives 08/17/2018  Does Patient Have a Medical Advance Directive? Yes;No  Type of Advance Directive Living will  Does patient want to make changes to medical advance directive? No - Patient declined  Copy of [place] in Chart? -  Would patient like information on creating a medical advance directive? No - Patient declined  Pre-existing out of facility DNR order (yellow form or pink MOST form) -     Chief Complaint  Patient presents with  . Acute Visit    possible UTI?    HPI: Patient is a 78 y.o. female with diabetes mellitus 2, obesity, COPD, fibromyalgia, osteoarthritis, recent back surgery  seen today for an acute visit for persistent burning with urination and frequency.  Has been treated for UTI several times lately but symptoms persist so I recommended she have a pelvic examination to see if there is some new pathology causing symptoms like prolapse, candidiasis or atrophic vaginitis.   She reports having the dysuria all of the time lately.  She is wearing adult briefs and liners due to her incontinence.  She also has to use the restroom to urinate every 2 hours.  She has not had fever, chills or abdominal pain.  She's been pushing fluids with improvement in the appearance of the urine and less severe burning.  At first urine was cloudy and purulent looking.  She uses a barrier cream due to wearing the adult undergarments  Her back has healed nicely thanks to the wound vac after she struggled with the possible infection and dehiscence issue.  She is seeing rheumatology tomorrow and continues to c/o concerns about her joints eroding and chronic pain.  She would like to be on some type of preventative dmard medication.  She is no longer on one due to risks for covid and I would also imagine due to her recent back surgery and challenges healing, as well as now increasingly  frequent UTIs amid diabetes.  Past Medical History:  Diagnosis Date  . Abnormality of gait 04/19/2016  . Acute infective polyneuritis ([place]) 2002  . Allergic rhinitis due to pollen   . Chronic pain syndrome   . COPD (chronic obstructive pulmonary disease) (HCC)    chronic bronchitis  . Depressive disorder, not elsewhere classified   . Diabetes mellitus without complication ([place])   . Diaphragmatic hernia without mention of obstruction or gangrene   . Dyslipidemia   . Fibromyalgia   . GERD (gastroesophageal reflux disease)    with h/o esophagitis  . Guillain-Barre syndrome ([place])   . History of benign thymus tumor   . Insomnia, unspecified   . Lumbar spinal stenosis 03/30/2018   L4-5 level, severe  . Miscarriage 1962  . Mixed hyperlipidemia   . Morbid obesity ([place])   . Osteoporosis   . Pneumonia   . Polyneuropathy in diabetes(357.2)   . Rheumatoid arthritis(714.0)   . Spondylosis, lumbosacral   . Spontaneous ecchymoses   . Type II or unspecified type diabetes mellitus with peripheral circulatory disorders, uncontrolled(250.72)   . Unspecified chronic bronchitis ([place])   . Unspecified essential hypertension   . Unspecified hypothyroidism   . Unspecified pruritic disorder   . Unspecified urinary incontinence     Past Surgical History:  Procedure Laterality Date  . ABDOMINAL HYSTERECTOMY  1974  . abdominal tumor  2002  . APPENDECTOMY    . APPLICATION OF A-CELL OF BACK N/A  09/26/2018   Procedure: With Acell;  Surgeon: Wallace Going, DO;  Location: [place];  Service: Plastics;  Laterality: N/A;  . APPLICATION OF WOUND VAC N/A 09/26/2018   Procedure: Vac placement;  Surgeon: Wallace Going, DO;  Location: [place];  Service: Plastics;  Laterality: N/A;  . [place]  . BRONCHOSCOPY  2001  . CHOLECYSTECTOMY  1984  . INCISION AND DRAINAGE OF WOUND N/A 09/26/2018   Procedure: Debridement of spine wound;  Surgeon: Wallace Going, DO;  Location: [place];  Service:  Plastics;  Laterality: N/A;  . KNEE SURGERY Bilateral 08/27/2010 (L) and 01/11/2011 (R)  . LUMBAR LAMINECTOMY/DECOMPRESSION MICRODISCECTOMY N/A 07/03/2018   Procedure: Lumbar Three to Lumbar Five Laminectomy;  Surgeon: Judith Part, MD;  Location: [place];  Service: Neurosurgery;  Laterality: N/A;  . LUMBAR WOUND DEBRIDEMENT N/A 08/20/2018   Procedure: POSTERIOR LUMBAR SPINAL WOUND DEBRIDEMENT AND REVISION;  Surgeon: Judith Part, MD;  Location: [place];  Service: Neurosurgery;  Laterality: N/A;  POSTERIOR LUMBAR SPINAL WOUND REVISION  . [place]  . OVARIAN CYST SURGERY  1968  . thymus tumor    . 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 TONSILLECTOMY      Allergies  Allergen Reactions  . Influenza Vaccines Other (See Comments)    "h/o Guillain Barre; dr's told me years ago never to take another flu shot as it could relapse Rosalee Kaufman; has to do with when vaccine being changed to H1N1 virus"  . Penicillins Hives, Itching, Swelling and Rash    Has patient had a PCN reaction causing immediate rash, facial/tongue/throat swelling, SOB or lightheadedness with hypotension:Unknown Has patient had a PCN reaction causing severe rash involving mucus membranes or skin necrosis: Unknown Has patient had a PCN reaction that required hospitalization:Unknown Has patient had a PCN reaction occurring within the last 10 years: Unknown If all of the above answers are "NO", then may proceed with Cephalosporin use.   . Statins Other (See Comments)    Myopathy, transaminitis  . Sulfamethoxazole-Trimethoprim Itching    Outpatient Encounter Medications as of 01/03/2019  Medication Sig  . acetaminophen (TYLENOL) 325 MG tablet Take 650 mg by mouth every 6 (six) hours as needed (for pain.).  Marland Kitchen albuterol (PROVENTIL HFA;VENTOLIN HFA) 108 (90 Base) MCG/ACT inhaler Inhale 2 puffs into the lungs every 6 (six) hours as needed for wheezing or shortness of breath.  Marland Kitchen amLODipine (NORVASC) 10 MG tablet Take 1 tablet  (10 mg total) by mouth daily. Please schedule appointment for refills.  Marland Kitchen aspirin EC 81 MG tablet Take 81 mg by mouth 2 (two) times a week.  . B-D UF III MINI PEN NEEDLES 31G X 5 MM MISC USE AS DIRECTED  . docusate sodium (COLACE) 100 MG capsule Take 100 mg by mouth daily as needed (constipation.).   Marland Kitchen folic acid (FOLVITE) 1 MG tablet Take 2 tablets (2 mg total) by mouth daily.  . furosemide (LASIX) 20 MG tablet TAKE 1 TABLET BY MOUTH TWICE DAILY AS NEEDED FOR 3 POUND WEIGHT GAIN IN ONE DAY OR 5 POUNDS IN ONE WEEK  . gabapentin (NEURONTIN) 600 MG tablet TAKE 1 TABLET BY MOUTH THREE TIMES DAILY  . HUMALOG KWIKPEN 200 UNIT/ML SOPN INJECT 20 UNITS UNDER THE SKIN THREE TIMES DAILY BEFORE A MEAL  . hydroxychloroquine (PLAQUENIL) 200 MG tablet Take 1 tablet (200 mg total) by mouth 2 (two) times daily.  Marland Kitchen levothyroxine (SYNTHROID) 137 MCG tablet TAKE 1 TABLET BY MOUTH DAILY  BEFORE BREAKFAST ON AN EMPTY STOMACH( LABS OVERDUE)  . lisinopril (PRINIVIL,ZESTRIL) 20 MG tablet Take 1 tablet (20 mg total) by mouth daily.  Marland Kitchen loperamide (IMODIUM A-D) 2 MG tablet Take 2 mg by mouth 4 (four) times daily as needed for diarrhea or loose stools.  . magnesium oxide (MAG-OX) 400 MG tablet Take 400 mg by mouth daily as needed (for leg cramps).   . Melatonin 10 MG TABS Take 10 mg by mouth at bedtime.  . metoprolol tartrate (LOPRESSOR) 50 MG tablet Take 50 mg by mouth 2 (two) times daily.   Marland Kitchen MYRBETRIQ 50 MG TB24 tablet TAKE 1 TABLET(50 MG) BY MOUTH DAILY  . nystatin (NYSTATIN) powder Apply topically daily as needed.  . nystatin cream (MYCOSTATIN) Apply 1 application topically 2 (two) times daily. To skin folds  . omega-3 acid ethyl esters (LOVAZA) 1 g capsule TAKE ONE CAPSULE BY MOUTH EVERY DAY  . oxyCODONE (ROXICODONE) 5 MG immediate release tablet Take 1 tablet (5 mg total) by mouth every 6 (six) hours as needed for severe pain.  . polyvinyl alcohol (LIQUIFILM TEARS) 1.4 % ophthalmic solution Place 1 drop into both eyes  4 (four) times daily as needed (for dry/irritated eyes).   . potassium chloride (MICRO-K) 10 MEQ CR capsule Take 2 capsules (20 mEq total) by mouth 2 (two) times daily as needed (when taking a dose of lasix.).  Marland Kitchen tiZANidine (ZANAFLEX) 2 MG tablet Take 1 tablet (2 mg total) by mouth 3 (three) times daily.  . TOUJEO SOLOSTAR 300 UNIT/ML SOPN INJECT 100 UNITS UNDER THE SKIN EVERY NIGHT AT BEDTIME  . triamterene-hydrochlorothiazide (MAXZIDE-25) 37.5-25 MG tablet Take 1 tablet by mouth daily.  Marland Kitchen umeclidinium-vilanterol (ANORO ELLIPTA) 62.5-25 MCG/INH AEPB Inhale 1 puff into the lungs daily.  Marland Kitchen venlafaxine XR (EFFEXOR-XR) 75 MG 24 hr capsule Take one capsule by mouth once daily with breakfast  . VICTOZA 18 MG/3ML SOPN ADMINISTER 1.8 MG UNDER THE SKIN DAILY FOR BLOOD SUGAR   No facility-administered encounter medications on file as of 01/03/2019.     Review of Systems:  Review of Systems  Constitutional: Negative for chills, fever and malaise/fatigue.  HENT: Negative for hearing loss.   Eyes: Negative for blurred vision.  Respiratory: Negative for cough and shortness of breath.   Cardiovascular: Positive for leg swelling. Negative for chest pain and palpitations.  Gastrointestinal: Negative for abdominal pain, blood in stool, constipation, diarrhea and melena.  Genitourinary: Positive for dysuria, frequency and urgency. Negative for flank pain and hematuria.       Incontinence  Musculoskeletal: Negative for falls and joint pain.  Skin: Negative for itching and rash.  Neurological: Positive for tingling and sensory change. Negative for dizziness and loss of consciousness.  Endo/Heme/Allergies: Does not bruise/bleed easily.  Psychiatric/Behavioral: Negative for depression and memory loss. The patient is not nervous/anxious and does not have insomnia.     Health Maintenance  Topic Date Due  . OPHTHALMOLOGY EXAM  12/20/1950  . FOOT EXAM  09/27/2016  . INFLUENZA VACCINE  03/23/2019  . HEMOGLOBIN  A1C  06/08/2019  . DEXA SCAN  09/20/2025  . TETANUS/TDAP  05/28/2026  . PNA vac Low Risk Adult  Completed    Physical Exam: There were no vitals filed for this visit. There is no height or weight on file to calculate BMI. Physical Exam Vitals signs reviewed.  Constitutional:      Appearance: Normal appearance.  HENT:     Head: Normocephalic and atraumatic.  Pulmonary:  Effort: Pulmonary effort is normal.  Abdominal:     General: Bowel sounds are normal. There is no distension.     Palpations: Abdomen is soft. There is no mass.     Tenderness: There is no abdominal tenderness. There is no right CVA tenderness, left CVA tenderness, guarding or rebound.  Genitourinary:    Comments: Erythema of labia, vulva inside vagina; no discharge; some tenderness, tissues are NOT dry or thin-appearing; there does appear to be a slight prolapse visible in vagina during examination Musculoskeletal: Normal range of motion.     Comments: Uses walker  Skin:    General: Skin is warm and dry.     Comments: Back incision has healed  Neurological:     General: No focal deficit present.     Mental Status: She is alert and oriented to person, place, and time.  Psychiatric:        Mood and Affect: Mood normal.     Labs reviewed: Basic Metabolic Panel: Recent Labs    04/06/18 0930  08/09/18 1224  08/22/18 0447 09/26/18 1016 12/07/18 0956  NA 141   < > 142   < > 135 141 143  K 4.8   < > 4.1   < > 4.0 3.7 4.1  CL 103   < > 104   < > 99 102 104  CO2 31   < > 30   < > 28 24 32  GLUCOSE 102*   < > 45*   < > 270* 153* 116*  BUN 18   < > 19   < > 22 22 16   CREATININE 0.93   < > 1.00*   < > 0.93 0.94 0.91  CALCIUM 9.1   < > 9.8   < > 8.5* 9.8 9.6  PHOS  --   --   --   --  2.5  --   --   TSH 0.73  --  0.84  --   --   --  3.75   < > = values in this interval not displayed.   Liver Function Tests: Recent Labs    04/06/18 0930 08/09/18 1224 08/22/18 0447 12/07/18 0956  AST 41* 17  --  14    ALT 18 9  --  10  BILITOT 0.4 0.4  --  0.4  PROT 6.3 6.5  --  6.9  ALBUMIN  --   --  2.6*  --    No results for input(s): LIPASE, AMYLASE in the last 8760 hours. No results for input(s): AMMONIA in the last 8760 hours. CBC: Recent Labs    04/06/18 0930  08/09/18 1224 08/17/18 1817 09/26/18 1016 12/07/18 0956  WBC 14.5*   < > 17.2* 13.3* 16.8* 12.9*  NEUTROABS 10,701*  --  12,264*  --   --  9,520*  HGB 13.2   < > 13.9 12.9 13.9 14.0  HCT 39.7   < > 42.9 41.8 45.6 43.4  MCV 83.8   < > 84.8 87.1 85.6 80.8  PLT 346   < > 430* 292 387 339   < > = values in this interval not displayed.   Lipid Panel: Recent Labs    04/06/18 0930 08/09/18 1224 12/07/18 0956  CHOL 129 159 142  HDL 29* 30* 38*  LDLCALC 75 97 83  TRIG 145 219* 118  CHOLHDL 4.4 5.3* 3.7   Lab Results  Component Value Date   HGBA1C 7.4 (H) 12/07/2018    Assessment/Plan 1.  Dysuria - positive dipstick -await culture to avoid increasing resistance -cont to push fluids -educated on proper wiping procedure, pad changing, use of barrier cream, hydation - POC Urinalysis Dipstick - Urine Culture--will treat when results return  2. Recurrent UTI -has been having multiple -I do not see signs of atrophic vaginitis -I do see a slight prolapse in her vagina--if symptoms persist after txing this UTI, will ask urogyn to see her (Dr. Orinda Kenner) to address this--?needs pessary if this will help -kegels were recommended and an exercise program at urology previously  Labs/tests ordered:   Orders Placed This Encounter  Procedures  . Urine Culture  . POC Urinalysis Dipstick    Next appt:  04/09/2019--keep as scheduled  Omah Dewalt L. Negar Sieler, D.O. [facility] [address], [place] [address] Cell Phone (Mon-Fri 8am-5pm):  [phone] On Call:  [phone] & follow prompts after 5pm & weekends Office Phone:  [phone] Office Fax:  [phone]

## 2019-01-04 ENCOUNTER — Encounter: Payer: Self-pay | Admitting: Rheumatology

## 2019-01-04 ENCOUNTER — Ambulatory Visit (INDEPENDENT_AMBULATORY_CARE_PROVIDER_SITE_OTHER): Payer: Medicare Other | Admitting: Rheumatology

## 2019-01-04 VITALS — BP 123/62 | HR 91 | Resp 18 | Ht 66.0 in | Wt 242.2 lb

## 2019-01-04 DIAGNOSIS — Z8639 Personal history of other endocrine, nutritional and metabolic disease: Secondary | ICD-10-CM

## 2019-01-04 DIAGNOSIS — M51369 Other intervertebral disc degeneration, lumbar region without mention of lumbar back pain or lower extremity pain: Secondary | ICD-10-CM

## 2019-01-04 DIAGNOSIS — M3501 Sicca syndrome with keratoconjunctivitis: Secondary | ICD-10-CM | POA: Diagnosis not present

## 2019-01-04 DIAGNOSIS — M5136 Other intervertebral disc degeneration, lumbar region: Secondary | ICD-10-CM

## 2019-01-04 DIAGNOSIS — M810 Age-related osteoporosis without current pathological fracture: Secondary | ICD-10-CM | POA: Diagnosis not present

## 2019-01-04 DIAGNOSIS — Z8669 Personal history of other diseases of the nervous system and sense organs: Secondary | ICD-10-CM

## 2019-01-04 DIAGNOSIS — Z79899 Other long term (current) drug therapy: Secondary | ICD-10-CM | POA: Diagnosis not present

## 2019-01-04 DIAGNOSIS — M797 Fibromyalgia: Secondary | ICD-10-CM

## 2019-01-04 DIAGNOSIS — M0579 Rheumatoid arthritis with rheumatoid factor of multiple sites without organ or systems involvement: Secondary | ICD-10-CM

## 2019-01-04 DIAGNOSIS — G4709 Other insomnia: Secondary | ICD-10-CM | POA: Diagnosis not present

## 2019-01-04 DIAGNOSIS — Z8659 Personal history of other mental and behavioral disorders: Secondary | ICD-10-CM | POA: Diagnosis not present

## 2019-01-04 DIAGNOSIS — T8131XD Disruption of external operation (surgical) wound, not elsewhere classified, subsequent encounter: Secondary | ICD-10-CM | POA: Diagnosis not present

## 2019-01-04 DIAGNOSIS — Z8679 Personal history of other diseases of the circulatory system: Secondary | ICD-10-CM

## 2019-01-04 DIAGNOSIS — F331 Major depressive disorder, recurrent, moderate: Secondary | ICD-10-CM | POA: Diagnosis not present

## 2019-01-04 DIAGNOSIS — Z8709 Personal history of other diseases of the respiratory system: Secondary | ICD-10-CM | POA: Diagnosis not present

## 2019-01-04 DIAGNOSIS — E1140 Type 2 diabetes mellitus with diabetic neuropathy, unspecified: Secondary | ICD-10-CM | POA: Diagnosis not present

## 2019-01-04 DIAGNOSIS — M170 Bilateral primary osteoarthritis of knee: Secondary | ICD-10-CM

## 2019-01-04 DIAGNOSIS — M0589 Other rheumatoid arthritis with rheumatoid factor of multiple sites: Secondary | ICD-10-CM | POA: Diagnosis not present

## 2019-01-04 DIAGNOSIS — I10 Essential (primary) hypertension: Secondary | ICD-10-CM | POA: Diagnosis not present

## 2019-01-04 DIAGNOSIS — Z794 Long term (current) use of insulin: Secondary | ICD-10-CM | POA: Diagnosis not present

## 2019-01-04 DIAGNOSIS — Z8719 Personal history of other diseases of the digestive system: Secondary | ICD-10-CM

## 2019-01-05 DIAGNOSIS — M0589 Other rheumatoid arthritis with rheumatoid factor of multiple sites: Secondary | ICD-10-CM | POA: Diagnosis not present

## 2019-01-05 DIAGNOSIS — F331 Major depressive disorder, recurrent, moderate: Secondary | ICD-10-CM | POA: Diagnosis not present

## 2019-01-05 DIAGNOSIS — Z794 Long term (current) use of insulin: Secondary | ICD-10-CM | POA: Diagnosis not present

## 2019-01-05 DIAGNOSIS — T8131XD Disruption of external operation (surgical) wound, not elsewhere classified, subsequent encounter: Secondary | ICD-10-CM | POA: Diagnosis not present

## 2019-01-05 DIAGNOSIS — I10 Essential (primary) hypertension: Secondary | ICD-10-CM | POA: Diagnosis not present

## 2019-01-05 DIAGNOSIS — E1140 Type 2 diabetes mellitus with diabetic neuropathy, unspecified: Secondary | ICD-10-CM | POA: Diagnosis not present

## 2019-01-05 LAB — URINE CULTURE
MICRO NUMBER:: 474937
SPECIMEN QUALITY:: ADEQUATE

## 2019-01-07 ENCOUNTER — Telehealth: Payer: Self-pay | Admitting: Rheumatology

## 2019-01-07 ENCOUNTER — Other Ambulatory Visit: Payer: Self-pay | Admitting: Internal Medicine

## 2019-01-07 DIAGNOSIS — E1140 Type 2 diabetes mellitus with diabetic neuropathy, unspecified: Secondary | ICD-10-CM | POA: Diagnosis not present

## 2019-01-07 DIAGNOSIS — Z794 Long term (current) use of insulin: Secondary | ICD-10-CM | POA: Diagnosis not present

## 2019-01-07 DIAGNOSIS — N3000 Acute cystitis without hematuria: Secondary | ICD-10-CM

## 2019-01-07 DIAGNOSIS — M0589 Other rheumatoid arthritis with rheumatoid factor of multiple sites: Secondary | ICD-10-CM | POA: Diagnosis not present

## 2019-01-07 DIAGNOSIS — T8131XD Disruption of external operation (surgical) wound, not elsewhere classified, subsequent encounter: Secondary | ICD-10-CM | POA: Diagnosis not present

## 2019-01-07 DIAGNOSIS — F331 Major depressive disorder, recurrent, moderate: Secondary | ICD-10-CM | POA: Diagnosis not present

## 2019-01-07 DIAGNOSIS — I10 Essential (primary) hypertension: Secondary | ICD-10-CM | POA: Diagnosis not present

## 2019-01-07 MED ORDER — CEFACLOR 500 MG PO CAPS: 500.0000 mg | ORAL_CAPSULE | Freq: Three times a day (TID) | ORAL | 0 refills | Status: DC

## 2019-01-07 NOTE — Telephone Encounter (Signed)
Patient advised that Dr. Estanislado Pandy does not prescribe hydrocodone. Patient advised Dr. Estanislado Pandy has placed an order to pain management.

## 2019-01-07 NOTE — Telephone Encounter (Signed)
Patient calling because she thought  Dr. Estanislado Pandy was going to send in an Rx for Hydrocodone for her on Friday to [facility] on [address] .Pharmacy does not have an rx. Please call patient to advise.

## 2019-01-10 DIAGNOSIS — Z794 Long term (current) use of insulin: Secondary | ICD-10-CM | POA: Diagnosis not present

## 2019-01-10 DIAGNOSIS — I10 Essential (primary) hypertension: Secondary | ICD-10-CM | POA: Diagnosis not present

## 2019-01-10 DIAGNOSIS — M0589 Other rheumatoid arthritis with rheumatoid factor of multiple sites: Secondary | ICD-10-CM | POA: Diagnosis not present

## 2019-01-10 DIAGNOSIS — F331 Major depressive disorder, recurrent, moderate: Secondary | ICD-10-CM | POA: Diagnosis not present

## 2019-01-10 DIAGNOSIS — E1140 Type 2 diabetes mellitus with diabetic neuropathy, unspecified: Secondary | ICD-10-CM | POA: Diagnosis not present

## 2019-01-10 DIAGNOSIS — T8131XD Disruption of external operation (surgical) wound, not elsewhere classified, subsequent encounter: Secondary | ICD-10-CM | POA: Diagnosis not present

## 2019-01-11 ENCOUNTER — Encounter: Payer: Self-pay | Admitting: Family

## 2019-01-11 ENCOUNTER — Ambulatory Visit (INDEPENDENT_AMBULATORY_CARE_PROVIDER_SITE_OTHER): Payer: Medicare Other | Admitting: Family

## 2019-01-11 ENCOUNTER — Other Ambulatory Visit: Payer: Self-pay

## 2019-01-11 DIAGNOSIS — Z0000 Encounter for general adult medical examination without abnormal findings: Secondary | ICD-10-CM | POA: Diagnosis not present

## 2019-01-11 NOTE — Patient Instructions (Signed)
Katherine Delgado , Thank you for taking time to come for your Medicare Wellness Visit. I appreciate your ongoing commitment to your health goals. Please review the following plan we discussed and let me know if I can assist you in the future.   Screening recommendations/referrals: Colonoscopy: Up to date  Mammogram: Due this year  Bone Density: Up to date  Recommended yearly ophthalmology/optometry visit for glaucoma screening and checkup Recommended yearly dental visit for hygiene and checkup  Vaccinations: Influenza vaccine: Contraindicated  Pneumococcal vaccine: Up to date  Tdap vaccine: Up to date due 2027  Shingles vaccine: Declined due to cost   Advanced directives: yes   Conditions/risks identified: advance age female > 71 years,Type 2 DM,Hypertension,obesity,hyperlipidemia,Hx smoking   Next appointment: 1 year    Preventive Care 54 Years and Older, Female Preventive care refers to lifestyle choices and visits with your health care provider that can promote health and wellness. What does preventive care include?  A yearly physical exam. This is also called an annual well check.  Dental exams once or twice a year.  Routine eye exams. Ask your health care provider how often you should have your eyes checked.  Personal lifestyle choices, including:  Daily care of your teeth and gums.  Regular physical activity.  Eating a healthy diet.  Avoiding tobacco and drug use.  Limiting alcohol use.  Practicing safe sex.  Taking low-dose aspirin every day.  Taking vitamin and mineral supplements as recommended by your health care provider. What happens during an annual well check? The services and screenings done by your health care provider during your annual well check will depend on your age, overall health, lifestyle risk factors, and family history of disease. Counseling  Your health care provider may ask you questions about your:  Alcohol use.  Tobacco use.  Drug  use.  Emotional well-being.  Home and relationship well-being.  Sexual activity.  Eating habits.  History of falls.  Memory and ability to understand (cognition).  Work and work Statistician.  Reproductive health. Screening  You may have the following tests or measurements:  Height, weight, and BMI.  Blood pressure.  Lipid and cholesterol levels. These may be checked every 5 years, or more frequently if you are over 14 years old.  Skin check.  Lung cancer screening. You may have this screening every year starting at age 31 if you have a 30-pack-year history of smoking and currently smoke or have quit within the past 15 years.  Fecal occult blood test (FOBT) of the stool. You may have this test every year starting at age 67.  Flexible sigmoidoscopy or colonoscopy. You may have a sigmoidoscopy every 5 years or a colonoscopy every 10 years starting at age 19.  Hepatitis C blood test.  Hepatitis B blood test.  Sexually transmitted disease (STD) testing.  Diabetes screening. This is done by checking your blood sugar (glucose) after you have not eaten for a while (fasting). You may have this done every 1-3 years.  Bone density scan. This is done to screen for osteoporosis. You may have this done starting at age 68.  Mammogram. This may be done every 1-2 years. Talk to your health care provider about how often you should have regular mammograms. Talk with your health care provider about your test results, treatment options, and if necessary, the need for more tests. Vaccines  Your health care provider may recommend certain vaccines, such as:  Influenza vaccine. This is recommended every year.  Tetanus, diphtheria,  and acellular pertussis (Tdap, Td) vaccine. You may need a Td booster every 10 years.  Zoster vaccine. You may need this after age 65.  Pneumococcal 13-valent conjugate (PCV13) vaccine. One dose is recommended after age 61.  Pneumococcal polysaccharide  (PPSV23) vaccine. One dose is recommended after age 80. Talk to your health care provider about which screenings and vaccines you need and how often you need them. This information is not intended to replace advice given to you by your health care provider. Make sure you discuss any questions you have with your health care provider. Document Released: 09/04/2015 Document Revised: 04/27/2016 Document Reviewed: 06/09/2015 Elsevier Interactive Patient Education  2017 [place] [employer] Prevention in the Home Falls can cause injuries. They can happen to people of all ages. There are many things you can do to make your home safe and to help prevent falls. What can I do on the outside of my home?  Regularly fix the edges of walkways and driveways and fix any cracks.  Remove anything that might make you trip as you walk through a door, such as a raised step or threshold.  Trim any bushes or trees on the path to your home.  Use bright outdoor lighting.  Clear any walking paths of anything that might make someone trip, such as rocks or tools.  Regularly check to see if handrails are loose or broken. Make sure that both sides of any steps have handrails.  Any raised decks and porches should have guardrails on the edges.  Have any leaves, snow, or ice cleared regularly.  Use sand or salt on walking paths during winter.  Clean up any spills in your garage right away. This includes oil or grease spills. What can I do in the bathroom?  Use night lights.  Install grab bars by the toilet and in the tub and shower. Do not use towel bars as grab bars.  Use non-skid mats or decals in the tub or shower.  If you need to sit down in the shower, use a plastic, non-slip stool.  Keep the floor dry. Clean up any water that spills on the floor as soon as it happens.  Remove soap buildup in the tub or shower regularly.  Attach bath mats securely with double-sided non-slip rug tape.  Do not have  throw rugs and other things on the floor that can make you trip. What can I do in the bedroom?  Use night lights.  Make sure that you have a light by your bed that is easy to reach.  Do not use any sheets or blankets that are too big for your bed. They should not hang down onto the floor.  Have a firm chair that has side arms. You can use this for support while you get dressed.  Do not have throw rugs and other things on the floor that can make you trip. What can I do in the kitchen?  Clean up any spills right away.  Avoid walking on wet floors.  Keep items that you use a lot in easy-to-reach places.  If you need to reach something above you, use a strong step stool that has a grab bar.  Keep electrical cords out of the way.  Do not use floor polish or wax that makes floors slippery. If you must use wax, use non-skid floor wax.  Do not have throw rugs and other things on the floor that can make you trip. What can I do with my  stairs?  Do not leave any items on the stairs.  Make sure that there are handrails on both sides of the stairs and use them. Fix handrails that are broken or loose. Make sure that handrails are as long as the stairways.  Check any carpeting to make sure that it is firmly attached to the stairs. Fix any carpet that is loose or worn.  Avoid having throw rugs at the top or bottom of the stairs. If you do have throw rugs, attach them to the floor with carpet tape.  Make sure that you have a light switch at the top of the stairs and the bottom of the stairs. If you do not have them, ask someone to add them for you. What else can I do to help prevent falls?  Wear shoes that:  Do not have high heels.  Have rubber bottoms.  Are comfortable and fit you well.  Are closed at the toe. Do not wear sandals.  If you use a stepladder:  Make sure that it is fully opened. Do not climb a closed stepladder.  Make sure that both sides of the stepladder are  locked into place.  Ask someone to hold it for you, if possible.  Clearly mark and make sure that you can see:  Any grab bars or handrails.  First and last steps.  Where the edge of each step is.  Use tools that help you move around (mobility aids) if they are needed. These include:  Canes.  Walkers.  Scooters.  Crutches.  Turn on the lights when you go into a dark area. Replace any light bulbs as soon as they burn out.  Set up your furniture so you have a clear path. Avoid moving your furniture around.  If any of your floors are uneven, fix them.  If there are any pets around you, be aware of where they are.  Review your medicines with your doctor. Some medicines can make you feel dizzy. This can increase your chance of falling. Ask your doctor what other things that you can do to help prevent falls. This information is not intended to replace advice given to you by your health care provider. Make sure you discuss any questions you have with your health care provider. Document Released: 06/04/2009 Document Revised: 01/14/2016 Document Reviewed: 09/12/2014 Elsevier Interactive Patient Education  2017 [employer].

## 2019-01-11 NOTE — Progress Notes (Signed)
Subjective:   Katherine Delgado is a 78 y.o. female who presents for Medicare Annual (Subsequent) preventive examination.  Review of Systems:   Cardiac Risk Factors include: advanced age (>89men, >71 women);diabetes mellitus;obesity (BMI >30kg/m2);hypertension;smoking/ tobacco exposure;dyslipidemia     Objective:     Vitals: There were no vitals taken for this visit.  There is no height or weight on file to calculate BMI.  Advanced Directives 01/11/2019 08/17/2018 07/03/2018 04/05/2018 10/05/2017 02/20/2017 11/21/2016  Does Patient Have a Medical Advance Directive? Yes Yes;No Yes Yes Yes Yes Yes  Type of Advance Directive Living will;Healthcare Power of Attorney Living will Living will Out of facility DNR (pink MOST or yellow form) [place];Living will Out of facility DNR (pink MOST or yellow form) Out of facility DNR (pink MOST or yellow form)  Does patient want to make changes to medical advance directive? - No - Patient declined No - Patient declined No - Patient declined No - Patient declined - -  Copy of [place] in Chart? - - - - No - copy requested - -  Would patient like information on creating a medical advance directive? - No - Patient declined - - - - -  Pre-existing out of facility DNR order (yellow form or pink MOST form) - - - Pink MOST form placed in chart (order not valid for inpatient use) - Pink MOST form placed in chart (order not valid for inpatient use) Pink MOST form placed in chart (order not valid for inpatient use)    Tobacco Social History   Tobacco Use  Smoking Status Former Smoker  . Packs/day: 0.10  . Years: 10.00  . Pack years: 1.00  . Types: Cigarettes  . Last attempt to quit: 12/07/1979  . Years since quitting: 39.1  Smokeless Tobacco Never Used     Counseling given: Not Answered   Clinical Intake:  Pre-visit preparation completed: No  Pain : 0-10 Pain Score: 2  Pain Type: Chronic pain Pain Location:  Generalized(knees,shoulder and fibromylagia ) Pain Orientation: (arthrits ) Pain Descriptors / Indicators: Aching Pain Onset: Other (comment)(several years ) Pain Frequency: Constant Pain Relieving Factors: pain medication  Effect of Pain on Daily Activities: yes   Pain Relieving Factors: pain medication   Nutritional Risks: None Diabetes: Yes CBG done?: No Did pt. bring in CBG monitor from home?: No(recall 110's - 120's with few 200's with sweets sometimes ) Glucose Meter Downloaded?: No  How often do you need to have someone help you when you read instructions, pamphlets, or other written materials from your doctor or pharmacy?: 1 - Never What is the last grade level you completed in school?: College 2 years   Interpreter Needed?: No  Information entered by :: Deandre Brannan FNP-C   Past Medical History:  Diagnosis Date  . Abnormality of gait 04/19/2016  . Acute infective polyneuritis ([place]) 2002  . Allergic rhinitis due to pollen   . Chronic pain syndrome   . COPD (chronic obstructive pulmonary disease) (HCC)    chronic bronchitis  . Depressive disorder, not elsewhere classified   . Diabetes mellitus without complication ([place])   . Diaphragmatic hernia without mention of obstruction or gangrene   . Dyslipidemia   . Fibromyalgia   . GERD (gastroesophageal reflux disease)    with h/o esophagitis  . Guillain-Barre syndrome ([place])   . History of benign thymus tumor   . Insomnia, unspecified   . Lumbar spinal stenosis 03/30/2018   L4-5 level, severe  .  Miscarriage 1962  . Mixed hyperlipidemia   . Morbid obesity ([place])   . Osteoporosis   . Pneumonia   . Polyneuropathy in diabetes(357.2)   . Rheumatoid arthritis(714.0)   . Spondylosis, lumbosacral   . Spontaneous ecchymoses   . Type II or unspecified type diabetes mellitus with peripheral circulatory disorders, uncontrolled(250.72)   . Unspecified chronic bronchitis ([place])   . Unspecified essential hypertension   .  Unspecified hypothyroidism   . Unspecified pruritic disorder   . Unspecified urinary incontinence    Past Surgical History:  Procedure Laterality Date  . ABDOMINAL HYSTERECTOMY  1974  . abdominal tumor  2002  . APPENDECTOMY    . APPLICATION OF A-CELL OF BACK N/A 09/26/2018   Procedure: With [place];  Surgeon: Wallace Going, DO;  Location: [facility];  Service: Plastics;  Laterality: N/A;  . APPLICATION OF WOUND VAC N/A 09/26/2018   Procedure: Vac placement;  Surgeon: Wallace Going, DO;  Location: [facility];  Service: Plastics;  Laterality: N/A;  . [place]  . BRONCHOSCOPY  2001  . CHOLECYSTECTOMY  1984  . INCISION AND DRAINAGE OF WOUND N/A 09/26/2018   Procedure: Debridement of spine wound;  Surgeon: Wallace Going, DO;  Location: [facility];  Service: Plastics;  Laterality: N/A;  . KNEE SURGERY Bilateral 08/27/2010 (L) and 01/11/2011 (R)  . LUMBAR LAMINECTOMY/DECOMPRESSION MICRODISCECTOMY N/A 07/03/2018   Procedure: Lumbar Three to Lumbar Five Laminectomy;  Surgeon: Judith Part, MD;  Location: [facility];  Service: Neurosurgery;  Laterality: N/A;  . LUMBAR WOUND DEBRIDEMENT N/A 08/20/2018   Procedure: POSTERIOR LUMBAR SPINAL WOUND DEBRIDEMENT AND REVISION;  Surgeon: Judith Part, MD;  Location: [facility];  Service: Neurosurgery;  Laterality: N/A;  POSTERIOR LUMBAR SPINAL WOUND REVISION  . [place]  . OVARIAN CYST SURGERY  1968  . thymus tumor    . 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 TONSILLECTOMY     Family History  Problem Relation Age of Onset  . Alzheimer's disease Mother   . Heart disease Mother   . Heart disease Father   . Liver disease Father   . Cancer Brother   . Arthritis Son   . Colon polyps Brother   . Colon cancer Neg Hx   . Esophageal cancer Neg Hx   . Kidney disease Neg Hx   . Stomach cancer Neg Hx   . Rectal cancer Neg Hx    Social History   Socioeconomic History  . Marital status: Widowed    Spouse name: Not on file  . Number of children: 2   . Years of education: 78  . Highest education level: Not on file  Occupational History  . Occupation: Retired  Scientific laboratory technician  . Financial resource strain: Somewhat hard  . Food insecurity:    Worry: Sometimes true    Inability: Sometimes true  . Transportation needs:    Medical: No    Non-medical: No  Tobacco Use  . Smoking status: Former Smoker    Packs/day: 0.10    Years: 10.00    Pack years: 1.00    Types: Cigarettes    Last attempt to quit: 12/07/1979    Years since quitting: 39.1  . Smokeless tobacco: Never Used  Substance and Sexual Activity  . Alcohol use: No    Alcohol/week: 0.0 standard drinks  . Drug use: No  . Sexual activity: Never  Lifestyle  . Physical activity:    Days per week: 0 days  Minutes per session: 0 min  . Stress: Rather much  Relationships  . Social connections:    Talks on phone: More than three times a week    Gets together: More than three times a week    Attends religious service: Never    Active member of club or organization: No    Attends meetings of clubs or organizations: Never    Relationship status: Widowed  Other Topics Concern  . Not on file  Social History Narrative   Walks with cane   Right handed    Caffeine use: Coffee (2 cups every morning)   Tea: sometimes   Soda: none    Outpatient Encounter Medications as of 01/11/2019  Medication Sig  . acetaminophen (TYLENOL) 325 MG tablet Take 650 mg by mouth every 6 (six) hours as needed (for pain.).  Marland Kitchen albuterol (PROVENTIL HFA;VENTOLIN HFA) 108 (90 Base) MCG/ACT inhaler Inhale 2 puffs into the lungs every 6 (six) hours as needed for wheezing or shortness of breath.  Marland Kitchen amLODipine (NORVASC) 10 MG tablet Take 1 tablet (10 mg total) by mouth daily. Please schedule appointment for refills.  Marland Kitchen aspirin EC 81 MG tablet Take 81 mg by mouth 2 (two) times a week.  . B-D UF III MINI PEN NEEDLES 31G X 5 MM MISC USE AS DIRECTED  . cefaclor (CECLOR) 500 MG capsule Take 1 capsule (500 mg  total) by mouth 3 (three) times daily.  Marland Kitchen docusate sodium (COLACE) 100 MG capsule Take 100 mg by mouth daily as needed (constipation.).   Marland Kitchen folic acid (FOLVITE) 1 MG tablet Take 2 tablets (2 mg total) by mouth daily.  . furosemide (LASIX) 20 MG tablet TAKE 1 TABLET BY MOUTH TWICE DAILY AS NEEDED FOR 3 POUND WEIGHT GAIN IN ONE DAY OR 5 POUNDS IN ONE WEEK  . gabapentin (NEURONTIN) 600 MG tablet TAKE 1 TABLET BY MOUTH THREE TIMES DAILY  . HUMALOG KWIKPEN 200 UNIT/ML SOPN INJECT 20 UNITS UNDER THE SKIN THREE TIMES DAILY BEFORE A MEAL  . hydroxychloroquine (PLAQUENIL) 200 MG tablet Take 1 tablet (200 mg total) by mouth 2 (two) times daily.  Marland Kitchen levothyroxine (SYNTHROID) 137 MCG tablet TAKE 1 TABLET BY MOUTH DAILY BEFORE BREAKFAST ON AN EMPTY STOMACH( LABS OVERDUE)  . lisinopril (PRINIVIL,ZESTRIL) 20 MG tablet Take 1 tablet (20 mg total) by mouth daily.  Marland Kitchen loperamide (IMODIUM A-D) 2 MG tablet Take 2 mg by mouth 4 (four) times daily as needed for diarrhea or loose stools.  . magnesium oxide (MAG-OX) 400 MG tablet Take 400 mg by mouth daily as needed (for leg cramps).   . Melatonin 10 MG TABS Take 10 mg by mouth at bedtime.  . metoprolol tartrate (LOPRESSOR) 50 MG tablet Take 50 mg by mouth 2 (two) times daily.   Marland Kitchen MYRBETRIQ 50 MG TB24 tablet TAKE 1 TABLET(50 MG) BY MOUTH DAILY  . nystatin (NYSTATIN) powder Apply topically daily as needed.  . nystatin cream (MYCOSTATIN) Apply 1 application topically 2 (two) times daily. To skin folds  . omega-3 acid ethyl esters (LOVAZA) 1 g capsule TAKE ONE CAPSULE BY MOUTH EVERY DAY  . oxyCODONE (ROXICODONE) 5 MG immediate release tablet Take 1 tablet (5 mg total) by mouth every 6 (six) hours as needed for severe pain.  . polyvinyl alcohol (LIQUIFILM TEARS) 1.4 % ophthalmic solution Place 1 drop into both eyes 4 (four) times daily as needed (for dry/irritated eyes).   . potassium chloride (MICRO-K) 10 MEQ CR capsule Take 2 capsules (20 mEq  total) by mouth 2 (two) times  daily as needed (when taking a dose of lasix.).  Marland Kitchen tiZANidine (ZANAFLEX) 2 MG tablet TAKE 1 TABLET(2 MG) BY MOUTH THREE TIMES DAILY  . TOUJEO SOLOSTAR 300 UNIT/ML SOPN INJECT 100 UNITS UNDER THE SKIN EVERY NIGHT AT BEDTIME  . triamterene-hydrochlorothiazide (MAXZIDE-25) 37.5-25 MG tablet Take 1 tablet by mouth daily.  Marland Kitchen umeclidinium-vilanterol (ANORO ELLIPTA) 62.5-25 MCG/INH AEPB Inhale 1 puff into the lungs daily.  Marland Kitchen venlafaxine XR (EFFEXOR-XR) 75 MG 24 hr capsule TAKE 1 CAPSULE BY MOUTH EVERY DAY WITH BREAKFAST  . VICTOZA 18 MG/3ML SOPN ADMINISTER 1.8 MG UNDER THE SKIN DAILY FOR BLOOD SUGAR   No facility-administered encounter medications on file as of 01/11/2019.     Activities of Daily Living In your present state of health, do you have any difficulty performing the following activities: 01/11/2019 09/26/2018  Hearing? N N  Vision? Y N  Comment wears reading glasses  -  Difficulty concentrating or making decisions? N N  Walking or climbing stairs? N Y  Dressing or bathing? N N  Doing errands, shopping? Y -  Comment son assist  -  Conservation officer, nature and eating ? Y -  Comment son assist  -  Using the Toilet? N -  In the past six months, have you accidently leaked urine? Y -  Comment some incontinence  -  Do you have problems with loss of bowel control? N -  Managing your Medications? N -  Managing your Finances? N -  Housekeeping or managing your Housekeeping? Y -  Comment son assist  -  Some recent data might be hidden    Patient Care Team: Gayland Curry, DO as PCP - General (Geriatric Medicine) Bo Merino, MD (Rheumatology) [place], Legrand Como, MD as Attending Physician (Endocrinology) Newt Minion, MD as Consulting Physician (Orthopedic Surgery)    Assessment:   This is a routine wellness examination for Jalila.  Exercise Activities and Dietary recommendations Current Exercise Habits: Home exercise routine, Type of exercise: stretching, Time (Minutes): 30, Frequency  (Times/Week): 3, Weekly Exercise (Minutes/Week): 90, Intensity: Mild, Exercise limited by: Other - see comments(back pain )  Goals    . Patient Stated     Patient would like to get back into church.    . Weight (lb) < 200 lb (90.7 kg)     01/11/2019 would like to continue to loss weight to under 250 lbs     . Weight (lb) < 250 lb (113.4 kg)     Starting 07/07/16,  I will attempt to decrease my weight and get under 250 lbs.        Fall Risk Fall Risk  01/11/2019 12/10/2018 10/22/2018 08/30/2018 08/09/2018  Falls in the past year? 0 0 0 0 0  Number falls in past yr: 0 0 0 0 0  Injury with Fall? 0 0 0 0 0  Risk for fall due to : - - History of fall(s);Impaired balance/gait;Medication side effect;Impaired mobility - -  Follow up - - Education provided;Falls prevention discussed - -   Is the patient's home free of loose throw rugs in walkways, pet beds, electrical cords, etc?   no      Grab bars in the bathroom? yes      Handrails on the stairs?   no      Adequate lighting?   yes  Depression Screen PHQ 2/9 Scores 01/11/2019 12/10/2018 10/22/2018 08/30/2018  PHQ - 2 Score 0 0 0 0  Cognitive Function MMSE - Mini Mental State Exam 10/05/2017 07/07/2016  Orientation to time 5 5  Orientation to Place 5 5  Registration 3 3  Attention/ Calculation 5 5  Recall 3 3  Language- name 2 objects 2 2  Language- repeat 1 1  Language- follow 3 step command 3 3  Language- read & follow direction 1 1  Write a sentence 1 1  Copy design 1 1  Total score 30 30     6CIT Screen 01/11/2019  What Year? 0 points  What month? 0 points  What time? 0 points  Count back from 20 0 points  Months in reverse 0 points  Repeat phrase 0 points  Total Score 0    Immunization History  Administered Date(s) Administered  . Influenza-Unspecified 06/10/2011  . Pneumococcal Conjugate-13 07/07/2016  . Pneumococcal Polysaccharide-23 11/21/2003, 10/05/2017  . Tdap 05/28/2016    Qualifies for Shingles Vaccine?  Declined due to cost   Screening Tests Health Maintenance  Topic Date Due  . OPHTHALMOLOGY EXAM  12/20/1950  . FOOT EXAM  09/27/2016  . INFLUENZA VACCINE  03/23/2019  . HEMOGLOBIN A1C  06/08/2019  . DEXA SCAN  09/20/2025  . TETANUS/TDAP  05/28/2026  . PNA vac Low Risk Adult  Completed    Cancer Screenings: Lung: Low Dose CT Chest recommended if Age 78-80 years, 30 pack-year currently smoking OR have quit w/in 15years. Patient does not qualify. Breast:  Up to date on Mammogram? No patient will call imaging center then let provider know.  Up to date of Bone Density/Dexa? Yes Colorectal: up to date.Had 4 years ago   Additional Screenings: Hepatitis C Screening: low dose   Plan:   I have personally reviewed and noted the following in the patient's chart:   . Medical and social history . Use of alcohol, tobacco or illicit drugs  . Current medications and supplements . Functional ability and status . Nutritional status . Physical activity . Advanced directives . List of other physicians . Hospitalizations, surgeries, and ER visits in previous 12 months . Vitals . Screenings to include cognitive, depression, and falls . Referrals and appointments  In addition, I have reviewed and discussed with patient certain preventive protocols, quality metrics, and best practice recommendations. A written personalized care plan for preventive services as well as general preventive health recommendations were provided to patient.   Sandrea Hughs, NP  01/11/2019

## 2019-01-11 NOTE — Progress Notes (Signed)
   This service is provided via telemedicine  No vital signs collected/recorded due to the encounter was a telemedicine visit.   Location of patient (ex: home, work):  Home   Patient consents to a telephone visit:  Yes   Location of the provider (ex: office, home):  Office   Name of any referring provider:  Dr. Hollace Kinnier   Names of all persons participating in the telemedicine service and their role in the encounter:  Ruthell Rummage CMA, Dinah Ngetich NP, and Wyvonnia Dusky  Time spent on call:  Ruthell Rummage CMA, [place] 8 Minutes on phone with patient

## 2019-01-16 DIAGNOSIS — E1140 Type 2 diabetes mellitus with diabetic neuropathy, unspecified: Secondary | ICD-10-CM | POA: Diagnosis not present

## 2019-01-16 DIAGNOSIS — M0589 Other rheumatoid arthritis with rheumatoid factor of multiple sites: Secondary | ICD-10-CM | POA: Diagnosis not present

## 2019-01-16 DIAGNOSIS — Z794 Long term (current) use of insulin: Secondary | ICD-10-CM | POA: Diagnosis not present

## 2019-01-16 DIAGNOSIS — T8131XD Disruption of external operation (surgical) wound, not elsewhere classified, subsequent encounter: Secondary | ICD-10-CM | POA: Diagnosis not present

## 2019-01-16 DIAGNOSIS — I10 Essential (primary) hypertension: Secondary | ICD-10-CM | POA: Diagnosis not present

## 2019-01-16 DIAGNOSIS — F331 Major depressive disorder, recurrent, moderate: Secondary | ICD-10-CM | POA: Diagnosis not present

## 2019-01-18 ENCOUNTER — Other Ambulatory Visit: Payer: Self-pay | Admitting: *Deleted

## 2019-01-18 DIAGNOSIS — G8929 Other chronic pain: Secondary | ICD-10-CM

## 2019-01-18 MED ORDER — OXYCODONE HCL 5 MG PO TABS: 5.0000 mg | ORAL_TABLET | Freq: Four times a day (QID) | ORAL | 0 refills | Status: DC | PRN

## 2019-01-18 NOTE — Telephone Encounter (Signed)
Patient requested rx [facility] Verified LR:12/20/18 Pended and sent to Dr. Mariea Clonts for approval.

## 2019-01-21 ENCOUNTER — Telehealth: Payer: Self-pay | Admitting: *Deleted

## 2019-01-21 MED ORDER — ESTRADIOL 0.1 MG/GM VA CREA: 1.0000 | TOPICAL_CREAM | VAGINAL | 1 refills | Status: DC

## 2019-01-21 NOTE — Telephone Encounter (Signed)
Patient notified and agreed.  Patient stated that she will try the cream. Pended and sent to Dr. Mariea Clonts for approval.

## 2019-01-21 NOTE — Telephone Encounter (Signed)
I suspect some of her symptoms are overactive bladder related.  Also, at this point, she may benefit from an estrace cream three nights per week for vaginal atrophy.  It should help with dysuria.  Of course, she must chronically be drinking 6-8 8oz glasses of water daily.

## 2019-01-21 NOTE — Telephone Encounter (Signed)
Patient called and stated that she was seen on 01/03/19 for UTI and given an antibiotic. Patient stated that the antibiotic worked well in the beginning but the infection has not gone away. Patient thinks she needs more of the antibiotic or something different. Still complaining of Dysuria, persistent burning with urination. Finished all of the Antibiotic. No Fever.  Patient called c/o possible UrinaryTract Infection (UTI)  1. What symptoms are you having (frequency, urgency, dysuria, incontinence, confusion)? Dysuria, Frequency  2. Any fever or chills? No  3. Any suprapubic pain? No Pain, just pressure and pain when urinates.   4. Have you taken anything OTC for symptoms? Cranberry juice and water  5. How much water are you drinking daily? Drinking 6- 12oz bottles of water daily.  6. How long have you had your symptoms (onset)? Since beginning of May.  I will forward this information to your provider and call with instructions, if your symptoms persist or progress seek medical attention at your nearest urgent care or emergency room. Patient verbalized understanding.

## 2019-01-23 DIAGNOSIS — E1140 Type 2 diabetes mellitus with diabetic neuropathy, unspecified: Secondary | ICD-10-CM | POA: Diagnosis not present

## 2019-01-23 DIAGNOSIS — I10 Essential (primary) hypertension: Secondary | ICD-10-CM | POA: Diagnosis not present

## 2019-01-23 DIAGNOSIS — F331 Major depressive disorder, recurrent, moderate: Secondary | ICD-10-CM | POA: Diagnosis not present

## 2019-01-23 DIAGNOSIS — Z794 Long term (current) use of insulin: Secondary | ICD-10-CM | POA: Diagnosis not present

## 2019-01-23 DIAGNOSIS — T8131XD Disruption of external operation (surgical) wound, not elsewhere classified, subsequent encounter: Secondary | ICD-10-CM | POA: Diagnosis not present

## 2019-01-23 DIAGNOSIS — M0589 Other rheumatoid arthritis with rheumatoid factor of multiple sites: Secondary | ICD-10-CM | POA: Diagnosis not present

## 2019-02-05 ENCOUNTER — Other Ambulatory Visit: Payer: Self-pay

## 2019-02-05 ENCOUNTER — Encounter: Payer: Self-pay | Admitting: Family

## 2019-02-05 ENCOUNTER — Ambulatory Visit (INDEPENDENT_AMBULATORY_CARE_PROVIDER_SITE_OTHER): Payer: Medicare Other | Admitting: Family

## 2019-02-05 VITALS — BP 154/63

## 2019-02-05 DIAGNOSIS — B373 Candidiasis of vulva and vagina: Secondary | ICD-10-CM | POA: Diagnosis not present

## 2019-02-05 DIAGNOSIS — B3731 Acute candidiasis of vulva and vagina: Secondary | ICD-10-CM

## 2019-02-05 DIAGNOSIS — B372 Candidiasis of skin and nail: Secondary | ICD-10-CM

## 2019-02-05 MED ORDER — FLUCONAZOLE 150 MG PO TABS: 150.0000 mg | ORAL_TABLET | Freq: Once | ORAL | 0 refills | Status: AC

## 2019-02-05 MED ORDER — NYSTATIN 100000 UNIT/GM EX POWD: Freq: Three times a day (TID) | CUTANEOUS | 0 refills | Status: DC

## 2019-02-05 NOTE — Patient Instructions (Signed)

## 2019-02-05 NOTE — Progress Notes (Signed)
This service is provided via telemedicine  No vital signs collected/recorded due to the encounter was a telemedicine visit.   Location of patient (ex: home, work):  Home   Patient consents to a telephone visit: Yes   Location of the provider (ex: office, home): Office   Name of any referring provider: Dr. Hollace Kinnier  Names of all persons participating in the telemedicine service and their role in the encounter: Ruthell Rummage CMA,   NP, Wyvonnia Dusky   Time spent on call:  Ruthell Rummage CMA spent 10 Minutes with patient   Provider:   FNP-C  Gayland Curry, DO  Patient Care Team: Gayland Curry, DO as PCP - General (Geriatric Medicine) Bo Merino, MD (Rheumatology) Al[place], Legrand Como, MD as Attending Physician (Endocrinology) Newt Minion, MD as Consulting Physician (Orthopedic Surgery)  Extended Emergency Contact Information Primary Emergency Contact: Marlana Latus, [place] Phone: [phone] Relation: Son Secondary Emergency Contact: Jonnie Finner Address: [address]          [place], [place] [address] Johnnette Litter of [place] Phone: [phone] Relation: Son   Goals of care: Advanced Directive information Advanced Directives 01/11/2019  Does Patient Have a Medical Advance Directive? Yes  Type of Advance Directive Living will;Healthcare Power of Attorney  Does patient want to make changes to medical advance directive? -  Copy of [place] in Chart? -  Would patient like information on creating a medical advance directive? -  Pre-existing out of facility DNR order (yellow form or pink MOST form) -     Chief Complaint  Patient presents with  . Acute Visit    Vaginal Yeast Infection and yeast under breast  patient states duration of 3 days     HPI:  Pt is a 78 y.o. Delgado seen today for an acute visit for evaluation of vaginal itching and under  the breast skin redness and itching x 3 days.she states has had similar symptoms in the past.she denies any fever,chills or vaginal discharge.   Past Medical History:  Diagnosis Date  . Abnormality of gait 04/19/2016  . Acute infective polyneuritis ([place]) 2002  . Allergic rhinitis due to pollen   . Chronic pain syndrome   . COPD (chronic obstructive pulmonary disease) (HCC)    chronic bronchitis  . Depressive disorder, not elsewhere classified   . Diabetes mellitus without complication ([place])   . Diaphragmatic hernia without mention of obstruction or gangrene   . Dyslipidemia   . Fibromyalgia   . GERD (gastroesophageal reflux disease)    with h/o esophagitis  . Guillain-Barre syndrome ([place])   . History of benign thymus tumor   . Insomnia, unspecified   . Lumbar spinal stenosis 03/30/2018   L4-5 level, severe  . Miscarriage 1962  . Mixed hyperlipidemia   . Morbid obesity ([place])   . Osteoporosis   . Pneumonia   . Polyneuropathy in diabetes(357.2)   . Rheumatoid arthritis(714.0)   . Spondylosis, lumbosacral   . Spontaneous ecchymoses   . Type II or unspecified type diabetes mellitus with peripheral circulatory disorders, uncontrolled(250.72)   . Unspecified chronic bronchitis ([place])   . Unspecified essential hypertension   . Unspecified hypothyroidism   . Unspecified pruritic disorder   . Unspecified urinary incontinence    Past Surgical History:  Procedure Laterality Date  . ABDOMINAL HYSTERECTOMY  1974  . abdominal tumor  2002  . APPENDECTOMY    .  APPLICATION OF A-CELL OF BACK N/A 09/26/2018   Procedure: With [place];  Surgeon: Wallace Going, DO;  Location: [facility];  Service: Plastics;  Laterality: N/A;  . APPLICATION OF WOUND VAC N/A 09/26/2018   Procedure: Vac placement;  Surgeon: Wallace Going, DO;  Location: [facility];  Service: Plastics;  Laterality: N/A;  . [place]  . BRONCHOSCOPY  2001  . CHOLECYSTECTOMY  1984  . INCISION AND DRAINAGE OF WOUND N/A  09/26/2018   Procedure: Debridement of spine wound;  Surgeon: Wallace Going, DO;  Location: [facility];  Service: Plastics;  Laterality: N/A;  . KNEE SURGERY Bilateral 08/27/2010 (L) and 01/11/2011 (R)  . LUMBAR LAMINECTOMY/DECOMPRESSION MICRODISCECTOMY N/A 07/03/2018   Procedure: Lumbar Three to Lumbar Five Laminectomy;  Surgeon: Judith Part, MD;  Location: [facility];  Service: Neurosurgery;  Laterality: N/A;  . LUMBAR WOUND DEBRIDEMENT N/A 08/20/2018   Procedure: POSTERIOR LUMBAR SPINAL WOUND DEBRIDEMENT AND REVISION;  Surgeon: Judith Part, MD;  Location: [facility];  Service: Neurosurgery;  Laterality: N/A;  POSTERIOR LUMBAR SPINAL WOUND REVISION  . [place]  . OVARIAN CYST SURGERY  1968  . thymus tumor    . 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 TONSILLECTOMY      Allergies  Allergen Reactions  . Influenza Vaccines Other (See Comments)    "h/o Guillain Barre; dr's told me years ago never to take another flu shot as it could relapse Rosalee Kaufman; has to do with when vaccine being changed to H1N1 virus"  . Penicillins Hives, Itching, Swelling and Rash    Has patient had a PCN reaction causing immediate rash, facial/tongue/throat swelling, SOB or lightheadedness with hypotension:Unknown Has patient had a PCN reaction causing severe rash involving mucus membranes or skin necrosis: Unknown Has patient had a PCN reaction that required hospitalization:Unknown Has patient had a PCN reaction occurring within the last 10 years: Unknown If all of the above answers are "NO", then may proceed with Cephalosporin use.   . Statins Other (See Comments)    Myopathy, transaminitis  . Sulfamethoxazole-Trimethoprim Itching    Outpatient Encounter Medications as of 02/05/2019  Medication Sig  . acetaminophen (TYLENOL) 325 MG tablet Take 650 mg by mouth every 6 (six) hours as needed (for pain.).  Marland Kitchen albuterol (PROVENTIL HFA;VENTOLIN HFA) 108 (90 Base) MCG/ACT inhaler Inhale 2 puffs into the lungs  every 6 (six) hours as needed for wheezing or shortness of breath.  Marland Kitchen amLODipine (NORVASC) 10 MG tablet Take 1 tablet (10 mg total) by mouth daily. Please schedule appointment for refills.  Marland Kitchen aspirin EC 81 MG tablet Take 81 mg by mouth 2 (two) times a week.  . B-D UF III MINI PEN NEEDLES 31G X 5 MM MISC USE AS DIRECTED  . docusate sodium (COLACE) 100 MG capsule Take 100 mg by mouth daily as needed (constipation.).   Marland Kitchen estradiol (ESTRACE) 0.1 MG/GM vaginal cream Place 1 Applicatorful vaginally 3 (three) times a week.  . folic acid (FOLVITE) 1 MG tablet Take 2 tablets (2 mg total) by mouth daily.  . furosemide (LASIX) 20 MG tablet TAKE 1 TABLET BY MOUTH TWICE DAILY AS NEEDED FOR 3 POUND WEIGHT GAIN IN ONE DAY OR 5 POUNDS IN ONE WEEK  . gabapentin (NEURONTIN) 600 MG tablet TAKE 1 TABLET BY MOUTH THREE TIMES DAILY  . HUMALOG KWIKPEN 200 UNIT/ML SOPN INJECT 20 UNITS UNDER THE SKIN THREE TIMES DAILY BEFORE A MEAL  . levothyroxine (SYNTHROID) 137 MCG tablet TAKE 1  TABLET BY MOUTH DAILY BEFORE BREAKFAST ON AN EMPTY STOMACH( LABS OVERDUE)  . lisinopril (PRINIVIL,ZESTRIL) 20 MG tablet Take 1 tablet (20 mg total) by mouth daily.  Marland Kitchen loperamide (IMODIUM A-D) 2 MG tablet Take 2 mg by mouth 4 (four) times daily as needed for diarrhea or loose stools.  . magnesium oxide (MAG-OX) 400 MG tablet Take 400 mg by mouth daily as needed (for leg cramps).   . Melatonin 10 MG TABS Take 10 mg by mouth at bedtime.  . metoprolol tartrate (LOPRESSOR) 50 MG tablet Take 50 mg by mouth 2 (two) times daily.   Marland Kitchen MYRBETRIQ 50 MG TB24 tablet TAKE 1 TABLET(50 MG) BY MOUTH DAILY  . nystatin (NYSTATIN) powder Apply topically daily as needed.  . nystatin cream (MYCOSTATIN) Apply 1 application topically 2 (two) times daily. To skin folds  . omega-3 acid ethyl esters (LOVAZA) 1 g capsule TAKE ONE CAPSULE BY MOUTH EVERY DAY  . oxyCODONE (ROXICODONE) 5 MG immediate release tablet Take 1 tablet (5 mg total) by mouth every 6 (six) hours as  needed for severe pain.  . polyvinyl alcohol (LIQUIFILM TEARS) 1.4 % ophthalmic solution Place 1 drop into both eyes 4 (four) times daily as needed (for dry/irritated eyes).   . potassium chloride (MICRO-K) 10 MEQ CR capsule Take 2 capsules (20 mEq total) by mouth 2 (two) times daily as needed (when taking a dose of lasix.).  Marland Kitchen tiZANidine (ZANAFLEX) 2 MG tablet TAKE 1 TABLET(2 MG) BY MOUTH THREE TIMES DAILY  . TOUJEO SOLOSTAR 300 UNIT/ML SOPN INJECT 100 UNITS UNDER THE SKIN EVERY NIGHT AT BEDTIME  . triamterene-hydrochlorothiazide (MAXZIDE-25) 37.5-25 MG tablet Take 1 tablet by mouth daily.  Marland Kitchen umeclidinium-vilanterol (ANORO ELLIPTA) 62.5-25 MCG/INH AEPB Inhale 1 puff into the lungs daily.  Marland Kitchen venlafaxine XR (EFFEXOR-XR) 75 MG 24 hr capsule TAKE 1 CAPSULE BY MOUTH EVERY DAY WITH BREAKFAST  . VICTOZA 18 MG/3ML SOPN ADMINISTER 1.8 MG UNDER THE SKIN DAILY FOR BLOOD SUGAR  . [DISCONTINUED] cefaclor (CECLOR) 500 MG capsule Take 1 capsule (500 mg total) by mouth 3 (three) times daily.  . [DISCONTINUED] hydroxychloroquine (PLAQUENIL) 200 MG tablet Take 1 tablet (200 mg total) by mouth 2 (two) times daily.   No facility-administered encounter medications on file as of 02/05/2019.     Review of Systems  Constitutional: Negative for appetite change, chills, fatigue and fever.  HENT: Negative for congestion, rhinorrhea, sinus pressure, sinus pain, sneezing and sore throat.   Respiratory: Negative for cough, chest tightness, shortness of breath and wheezing.   Cardiovascular: Negative for chest pain, palpitations and leg swelling.  Gastrointestinal: Negative for abdominal distention, abdominal pain, constipation, diarrhea, nausea and vomiting.  Genitourinary: Negative for difficulty urinating, dysuria, flank pain, frequency, urgency, vaginal bleeding, vaginal discharge and vaginal pain.       Vaginal itching   Skin: Negative for pallor, rash and wound.       Skin redness under the breast   Neurological:  Negative for dizziness, light-headedness and headaches.    Immunization History  Administered Date(s) Administered  . Influenza-Unspecified 06/10/2011  . Pneumococcal Conjugate-13 07/07/2016  . Pneumococcal Polysaccharide-23 11/21/2003, 10/05/2017  . Tdap 05/28/2016   Pertinent  Health Maintenance Due  Topic Date Due  . OPHTHALMOLOGY EXAM  12/20/1950  . FOOT EXAM  09/27/2016  . INFLUENZA VACCINE  03/23/2019  . HEMOGLOBIN A1C  06/08/2019  . DEXA SCAN  09/20/2025  . PNA vac Low Risk Adult  Completed   Fall Risk  02/05/2019 01/11/2019 12/10/2018 10/22/2018  08/30/2018  Falls in the past year? 0 0 0 0 0  Number falls in past yr: 0 0 0 0 0  Injury with Fall? 0 0 0 0 0  Risk for fall due to : - - - History of fall(s);Impaired balance/gait;Medication side effect;Impaired mobility -  Follow up - - - Education provided;Falls prevention discussed -    Vitals:   02/05/19 1044  BP: (!) 154/63   There is no height or weight on file to calculate BMI. Physical Exam Unable to complete on Telephone visit.  Labs reviewed: Recent Labs    08/22/18 0447 09/26/18 1016 12/07/18 0956  NA 135 141 143  K 4.0 3.7 4.1  CL 99 102 104  CO2 28 24 32  GLUCOSE 270* 153* 116*  BUN 22 22 16   CREATININE 0.93 0.94 0.91  CALCIUM 8.5* 9.8 9.6  PHOS 2.5  --   --    Recent Labs    04/06/18 0930 08/09/18 1224 08/22/18 0447 12/07/18 0956  AST 41* 17  --  14  ALT 18 9  --  10  BILITOT 0.4 0.4  --  0.4  PROT 6.3 6.5  --  6.9  ALBUMIN  --   --  2.6*  --    Recent Labs    04/06/18 0930  08/09/18 1224 08/17/18 1817 09/26/18 1016 12/07/18 0956  WBC 14.5*   < > 17.2* 13.3* 16.8* 12.9*  NEUTROABS 10,701*  --  12,264*  --   --  9,520*  HGB 13.2   < > 13.9 12.9 13.9 14.0  HCT 39.7   < > 42.9 41.8 45.6 43.4  MCV 83.8   < > 84.8 87.1 85.6 80.8  PLT 346   < > 430* 292 387 339   < > = values in this interval not displayed.   Lab Results  Component Value Date   TSH 3.75 12/07/2018   Lab Results   Component Value Date   HGBA1C 7.4 (H) 12/07/2018   Lab Results  Component Value Date   CHOL 142 12/07/2018   HDL 38 (L) 12/07/2018   LDLCALC 83 12/07/2018   TRIG 118 12/07/2018   CHOLHDL 3.7 12/07/2018    Significant Diagnostic Results in last 30 days:  No results found.  Assessment/Plan 1. Candidal skin infection Report skin redness under the breast.Nystatin powder was effective for her in the past. - Nystatin powder 100,000 units apply to affected red areas under the breast three times daily x 14 days. Notify provider's office if redness not resolved. Skin yeast infection education information attached to AVS to be mailed to patient.    2. Candidiasis, vagina Afebrile.No discharge reported.diflucan 150 mg tablet take one by mouth x 1 dose then repeat another dose in one week. Peri-care hygiene and glucose control discussed.Her CBG readings are in the 120's.   Family/ staff Communication: Reviewed plan of care with patient.   Labs/tests ordered: None  Time spent with patient 11 minutes >50% time spent counseling; reviewing medical record; tests; labs; and developing future plan of care  Sandrea Hughs, NP

## 2019-02-10 ENCOUNTER — Other Ambulatory Visit: Payer: Self-pay | Admitting: Physician Assistant

## 2019-02-10 ENCOUNTER — Other Ambulatory Visit: Payer: Self-pay | Admitting: Internal Medicine

## 2019-02-10 DIAGNOSIS — M0579 Rheumatoid arthritis with rheumatoid factor of multiple sites without organ or systems involvement: Secondary | ICD-10-CM

## 2019-02-11 NOTE — Telephone Encounter (Addendum)
Last Visit: 01/04/19 Next visit: 04/02/19 Labs: 12/07/18 WBC 12.9, RBC 5.37, MCH 26.1, Neutro Abs 9,520 Eosinophils 503, Glucose 116 No baseline PLQ eye exam on file.   Attempted to contact patient and left message for patient to call the office.

## 2019-02-12 NOTE — Telephone Encounter (Signed)
Attempted to contact the patient and left message for patient to call the office.  

## 2019-02-13 ENCOUNTER — Other Ambulatory Visit: Payer: Self-pay | Admitting: Rheumatology

## 2019-02-13 DIAGNOSIS — M0579 Rheumatoid arthritis with rheumatoid factor of multiple sites without organ or systems involvement: Secondary | ICD-10-CM

## 2019-02-13 NOTE — Telephone Encounter (Signed)
Spoke with patient's son. There was some question as to whether to patient had to stop the medication as a recommendation of Dr. Mariea Clonts and the [facility] nurses because of an increase in her blood pressure. Patient had spoke with Dr. Estanislado Pandy and had been advised that the medication does not cause an increase in blood pressure. Patient has not had a baseline PLQ eye exam. Patient soon advised we will need patient to schedule a baseline PLQ eye exam.  Okay to refill PLQ?

## 2019-02-13 NOTE — Telephone Encounter (Signed)
Ok to refill PLQ.  Please advise patient to schedule eye exam ASAP and notify us once completed.

## 2019-02-19 ENCOUNTER — Other Ambulatory Visit: Payer: Self-pay | Admitting: *Deleted

## 2019-02-19 DIAGNOSIS — G8929 Other chronic pain: Secondary | ICD-10-CM

## 2019-02-19 MED ORDER — OXYCODONE HCL 5 MG PO TABS: 5.0000 mg | ORAL_TABLET | Freq: Four times a day (QID) | ORAL | 0 refills | Status: DC | PRN

## 2019-02-19 NOTE — Telephone Encounter (Signed)
Patient requested refill Epic LR: 01/18/2019 Pended Rx and sent to Dr. Sheppard Coil for approval.   (Dr. Mariea Clonts out of office.)

## 2019-02-20 ENCOUNTER — Ambulatory Visit: Payer: Self-pay | Admitting: Physician Assistant

## 2019-02-26 ENCOUNTER — Other Ambulatory Visit: Payer: Self-pay | Admitting: Internal Medicine

## 2019-02-26 ENCOUNTER — Ambulatory Visit: Payer: Medicare Other | Admitting: Physician Assistant

## 2019-03-08 ENCOUNTER — Other Ambulatory Visit: Payer: Self-pay | Admitting: Family

## 2019-03-11 NOTE — Telephone Encounter (Signed)
Patient requesting another refill for temporary treatment. Routing to provider who prescribed medication.

## 2019-03-12 ENCOUNTER — Other Ambulatory Visit: Payer: Self-pay | Admitting: Internal Medicine

## 2019-03-12 DIAGNOSIS — N3281 Overactive bladder: Secondary | ICD-10-CM

## 2019-03-19 ENCOUNTER — Other Ambulatory Visit: Payer: Self-pay | Admitting: *Deleted

## 2019-03-19 DIAGNOSIS — G8929 Other chronic pain: Secondary | ICD-10-CM

## 2019-03-19 MED ORDER — OXYCODONE HCL 5 MG PO TABS: 5.0000 mg | ORAL_TABLET | Freq: Four times a day (QID) | ORAL | 0 refills | Status: DC | PRN

## 2019-03-19 NOTE — Progress Notes (Signed)
Office Visit Note  Patient: Katherine Delgado             Date of Birth: 1941-03-14           MRN: [id number]             PCP: Gayland Curry, DO Referring: Gayland Curry, DO Visit Date: 04/02/2019 Occupation: @GUAROCC @  Subjective:  Pain in multiple joints    History of Present Illness: Katherine Delgado is a 78 y.o. female with history of seropositive rheumatoid arthritis, Sjogren's syndrome, osteoarthritis, osteoporosis, and fibromyalgia.  She was previously started on Plaquenil in April 2020, but she did not find it to be effective so she discontinued. She has been taking tylenol for pain relief.  She is having pain in multiple joints including right shoulder, pain in both elbow joints, both hands, both knee joints and both feet. She continues to have generalized muscle aches and muscle tenderness due to fibromyalgia.   She is not on treatment for osteoporosis at this time.   Activities of Daily Living:  Patient reports joint stiffness lasting all day.  Patient Reports nocturnal pain.  Difficulty dressing/grooming: Denies Difficulty climbing stairs: Reports Difficulty getting out of chair: Reports Difficulty using hands for taps, buttons, cutlery, and/or writing: Denies  Review of Systems  Constitutional: Positive for fatigue.  HENT: Positive for mouth dryness. Negative for mouth sores and nose dryness.   Eyes: Positive for dryness. Negative for pain and visual disturbance.  Respiratory: Negative for cough, hemoptysis, shortness of breath and difficulty breathing.   Cardiovascular: Positive for swelling in legs/feet. Negative for chest pain, palpitations and hypertension.  Gastrointestinal: Negative for blood in stool, constipation and diarrhea.  Genitourinary: Negative for painful urination.  Musculoskeletal: Positive for arthralgias, gait problem, joint pain, joint swelling, muscle weakness, morning stiffness and muscle tenderness. Negative for myalgias and myalgias.  Skin:  Positive for rash. Negative for color change, pallor, hair loss, nodules/bumps, skin tightness, ulcers and sensitivity to sunlight.  Allergic/Immunologic: Negative for susceptible to infections.  Neurological: Negative for dizziness and headaches.  Hematological: Negative for bruising/bleeding tendency and swollen glands.  Psychiatric/Behavioral: Positive for sleep disturbance. Negative for depressed mood. The patient is not nervous/anxious.     PMFS History:  Patient Active Problem List   Diagnosis Date Noted  . Mild episode of recurrent major depressive disorder ([place]) 10/22/2018  . Wound dehiscence 08/17/2018  . Lumbar spinal stenosis 03/30/2018  . Rheumatoid arthritis involving both wrists with positive rheumatoid factor ([place]) 06/14/2016  . High risk medication use 06/14/2016  . Sjogren's syndrome ([place]) 06/14/2016  . Primary osteoarthritis of both knees 06/14/2016  . DDD (degenerative disc disease), lumbar 06/14/2016  . Hypothyroidism 06/14/2016  . Osteoporosis 06/14/2016  . Abnormality of gait 04/19/2016  . Diabetes mellitus type 2 with peripheral artery disease ([place]) 11/27/2015  . Diabetes mellitus without complication ([place]) 98/33/8250  . Headache(784.0) 07/31/2013  . Sinus infection 07/31/2013  . Chronic low back pain 03/14/2013  . Insomnia 12/06/2012  . Nausea alone 12/06/2012  . Diarrhea 12/06/2012  . Urinary incontinence, urge 12/06/2012  . Lumbosacral root lesions, not elsewhere classified 11/27/2012  . Diabetic polyneuropathy associated with type 2 diabetes mellitus ([place]) 11/27/2012  . EDEMA 05/04/2010  . YEAST INFECTION 05/03/2010  . Hyperlipidemia 05/03/2010  . DEPRESSION 05/03/2010  . History of peripheral neuropathy 05/03/2010  . Essential hypertension 05/03/2010  . ALLERGIC RHINITIS 05/03/2010  . PNEUMONIA 05/03/2010  . COPD (chronic obstructive pulmonary disease) ([place]) 05/03/2010  . GERD 05/03/2010  .  Fibromyalgia 05/03/2010  . DYSPNEA 05/03/2010  . CHEST  PAIN 05/03/2010    Past Medical History:  Diagnosis Date  . Abnormality of gait 04/19/2016  . Acute infective polyneuritis ([place]) 2002  . Allergic rhinitis due to pollen   . Chronic pain syndrome   . COPD (chronic obstructive pulmonary disease) ([facility])    chronic bronchitis  . Depressive disorder, not elsewhere classified   . Diabetes mellitus without complication ([place])   . Diaphragmatic hernia without mention of obstruction or gangrene   . Dyslipidemia   . Fibromyalgia   . GERD (gastroesophageal reflux disease)    with h/o esophagitis  . Guillain-Barre syndrome ([place])   . History of benign thymus tumor   . Insomnia, unspecified   . Lumbar spinal stenosis 03/30/2018   L4-5 level, severe  . Miscarriage 1962  . Mixed hyperlipidemia   . Morbid obesity ([place])   . Osteoporosis   . Pneumonia   . Polyneuropathy in diabetes(357.2)   . Rheumatoid arthritis(714.0)   . Spondylosis, lumbosacral   . Spontaneous ecchymoses   . Type II or unspecified type diabetes mellitus with peripheral circulatory disorders, uncontrolled(250.72)   . Unspecified chronic bronchitis ([place])   . Unspecified essential hypertension   . Unspecified hypothyroidism   . Unspecified pruritic disorder   . Unspecified urinary incontinence     Family History  Problem Relation Age of Onset  . Alzheimer's disease Mother   . Heart disease Mother   . Heart disease Father   . Liver disease Father   . Cancer Brother   . Arthritis Son   . Colon polyps Brother   . Colon cancer Neg Hx   . Esophageal cancer Neg Hx   . Kidney disease Neg Hx   . Stomach cancer Neg Hx   . Rectal cancer Neg Hx    Past Surgical History:  Procedure Laterality Date  . ABDOMINAL HYSTERECTOMY  1974  . abdominal tumor  2002  . APPENDECTOMY    . APPLICATION OF A-CELL OF BACK N/A 09/26/2018   Procedure: With A[place];  Surgeon: Wallace Going, DO;  Location: [facility];  Service: Plastics;  Laterality: N/A;  . APPLICATION OF WOUND VAC N/A 09/26/2018    Procedure: Vac placement;  Surgeon: Wallace Going, DO;  Location: [facility];  Service: Plastics;  Laterality: N/A;  . [place]  . BRONCHOSCOPY  2001  . CHOLECYSTECTOMY  1984  . INCISION AND DRAINAGE OF WOUND N/A 09/26/2018   Procedure: Debridement of spine wound;  Surgeon: Wallace Going, DO;  Location: [facility];  Service: Plastics;  Laterality: N/A;  . KNEE SURGERY Bilateral 08/27/2010 (L) and 01/11/2011 (R)  . LUMBAR LAMINECTOMY/DECOMPRESSION MICRODISCECTOMY N/A 07/03/2018   Procedure: Lumbar Three to Lumbar Five Laminectomy;  Surgeon: Judith Part, MD;  Location: [facility];  Service: Neurosurgery;  Laterality: N/A;  . LUMBAR WOUND DEBRIDEMENT N/A 08/20/2018   Procedure: POSTERIOR LUMBAR SPINAL WOUND DEBRIDEMENT AND REVISION;  Surgeon: Judith Part, MD;  Location: [facility];  Service: Neurosurgery;  Laterality: N/A;  POSTERIOR LUMBAR SPINAL WOUND REVISION  . [place]  . OVARIAN CYST SURGERY  1968  . thymus tumor    . 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 TONSILLECTOMY     Social History   Social History Narrative   Walks with cane   Right handed    Caffeine use: Coffee (2 cups every morning)   Tea: sometimes   Soda: none   Immunization History  Administered Date(s)  Administered  . Influenza-Unspecified 06/10/2011  . Pneumococcal Conjugate-13 07/07/2016  . Pneumococcal Polysaccharide-23 11/21/2003, 10/05/2017  . Tdap 05/28/2016     Objective: Vital Signs: BP (!) 120/54 (BP Location: Right Arm, Patient Position: Sitting, Cuff Size: Normal)   Pulse 64   Resp 20   Ht 5\' 6"  (1.676 m)   Wt 238 lb 3.2 oz (108 kg)   BMI 38.45 kg/m    Physical Exam Vitals signs and nursing note reviewed.  Constitutional:      Appearance: She is well-developed.  HENT:     Head: Normocephalic and atraumatic.  Eyes:     Conjunctiva/sclera: Conjunctivae normal.  Neck:     Musculoskeletal: Normal range of motion.  Cardiovascular:     Rate and Rhythm: Normal rate and regular  rhythm.     Heart sounds: Normal heart sounds.  Pulmonary:     Effort: Pulmonary effort is normal.     Breath sounds: Normal breath sounds.  Abdominal:     General: Bowel sounds are normal.     Palpations: Abdomen is soft.  Lymphadenopathy:     Cervical: No cervical adenopathy.  Skin:    General: Skin is warm and dry.     Capillary Refill: Capillary refill takes less than 2 seconds.  Neurological:     Mental Status: She is alert and oriented to person, place, and time.  Psychiatric:        Behavior: Behavior normal.      Musculoskeletal Exam: Patient is in wheelchair.  She has good range of motion of her cervical spine.  Shoulder joints with good range of motion.  Elbow joints with good range of motion.  She has synovitis of her bilateral wrist joints MCP joints as described below.  She has swelling over her bilateral knee joints and ankle joints.  CDAI Exam: CDAI Score: 33.6  Patient Global: 8 mm; Provider Global: 8 mm Swollen: 16 ; Tender: 20  Joint Exam      Right  Left  Glenohumeral   Tender   Tender  Elbow   Tender   Tender  Wrist  Swollen Tender  Swollen Tender  MCP 1  Swollen Tender  Swollen Tender  MCP 2  Swollen Tender  Swollen Tender  MCP 3  Swollen Tender  Swollen Tender  MCP 4  Swollen Tender  Swollen Tender  MCP 5  Swollen Tender  Swollen Tender  Knee  Swollen Tender  Swollen Tender  Ankle  Swollen Tender  Swollen Tender     Investigation: No additional findings.  Imaging: No results found.  Recent Labs: Lab Results  Component Value Date   WBC 12.9 (H) 12/07/2018   HGB 14.0 12/07/2018   PLT 339 12/07/2018   NA 143 12/07/2018   K 4.1 12/07/2018   CL 104 12/07/2018   CO2 32 12/07/2018   GLUCOSE 116 (H) 12/07/2018   BUN 16 12/07/2018   CREATININE 0.91 12/07/2018   BILITOT 0.4 12/07/2018   ALKPHOS 113 02/20/2017   AST 14 12/07/2018   ALT 10 12/07/2018   PROT 6.9 12/07/2018   ALBUMIN 2.6 (L) 08/22/2018   CALCIUM 9.6 12/07/2018   GFRAA 71  12/07/2018    Speciality Comments: No specialty comments available.  Procedures:  No procedures performed Allergies: Influenza vaccines, Penicillins, Statins, and Sulfamethoxazole-trimethoprim  Last DEXA order by our office in January 2017 and forwarded to PCP Dr. Mariea Clonts.  Scan showed T score of -3.3 at left femur neck and -27% change in BMD compared to  prior scan in 2015. Dr. Mariea Clonts documented osteopenia and to take calcium and Vitamin D.  No prior treatment documented in Epic.      Assessment / Plan:     Visit Diagnoses: Rheumatoid arthritis involving multiple sites with positive rheumatoid factor ([place]) -patient has severe rheumatoid arthritis with synovitis involving multiple joints and ulnar deviation.  Treating her with DMARDs has been an issue due to noncompliance and concerned about the side effects.  We had detailed discussion regarding different treatment options and their side effects.  After reviewing indications side effects contraindications she was in agreement to start on leflunomide.  Indications side effects contraindications were discussed at length.  Medication counseling:  Patient was counseled on the purpose, proper use, and adverse effects of leflunomide including risk of infection, nausea/diarrhea/weight loss, increase in blood pressure, rash, hair loss, tingling in the hands and feet, and signs and symptoms of interstitial lung disease.   Also counseled on Black Box warning of liver injury and importance of avoiding alcohol while on therapy. Discussed that there is the possibility of an increased risk of malignancy but it is not well understood if this increased risk is due to the medication or the disease state.  Counseled patient to avoid live vaccines. Recommend annual influenza, Pneumovax 23, Prevnar 13, and Shingrix as indicated.   Discussed the importance of frequent monitoring of liver function and blood count.  Standing orders placed.  Discussed importance of birth  control while on leflunomide due to risk of congenital abnormalities, and patient confirms she is postmenopausal.  Provided patient with educational materials on leflunomide and answered all questions.  Patient consented to [place] use, and consent will be uploaded into the media tab.   Patient dose will be Arava 10 mg 1 tablet by mouth for one month and recheck labs at that time.  If her labs are stable, she will increase to Arava 20 mg po daily.  Prescription pending lab results and/or insurance approval.  Sjogren's syndrome with keratoconjunctivitis sicca ([place]) -she continues to have some sicca symptoms.  High risk medication use - PLQ. d/c MTX and Xeljanz due to recurrent infections.  Primary osteoarthritis of both knees -she has severe osteoarthritis in her knee joints and has difficulty walking.  Fibromyalgia -she has generalized pain and discomfort from fibromyalgia but positive tender points.  DDD (degenerative disc disease), lumbar -chronic pain  Age-related osteoporosis without current pathological fracture - Plan: DG BONE DENSITY (DXA), we reviewed her last bone density from 2017 which showed osteoporosis.  She has not been on any treatment for osteoporosis.  She would like to have repeat bone density prior to starting any medications.  We will schedule the bone density.  I would be inclined towards putting her on Forteo or Tymlos based on her severe bone density loss.  Other insomnia -secondary to pain.  History of gastroesophageal reflux (GERD)   History of hypertension   History of Guillain-Barre syndrome  History of peripheral neuropathy   History of diabetes mellitus   History of depression  History of COPD  Orders: Orders Placed This Encounter  Procedures  . DG BONE DENSITY (DXA)  . CBC with Differential/Platelet  . COMPLETE METABOLIC PANEL WITH GFR  . Sedimentation rate  . Hepatitis B surface antigen  . Hepatitis B core antibody, IgM  . Hepatitis C antibody  .  QuantiFERON-TB Gold Plus  . Serum protein electrophoresis with reflex  . IgG, IgA, IgM   No orders of the defined types were placed  in this encounter.   Face-to-face time spent with patient was 30 minutes. Greater than 50% of time was spent in counseling and coordination of care.  Follow-Up Instructions: Return in about 3 months (around 07/03/2019) for Rheumatoid arthritis, Sjogren's syndrome, Osteoporosis.   Bo Merino, MD  Note - This record has been created using Editor, commissioning.  Chart creation errors have been sought, but may not always  have been located. Such creation errors do not reflect on  the standard of medical care.

## 2019-03-19 NOTE — Telephone Encounter (Signed)
Patient requested refill [facility] Verified LR: 02/20/2019 Pended Rx and sent to Dr. Mariea Clonts for approval.

## 2019-03-22 ENCOUNTER — Encounter: Payer: Self-pay | Admitting: Family

## 2019-03-22 ENCOUNTER — Other Ambulatory Visit: Payer: Self-pay

## 2019-03-22 ENCOUNTER — Ambulatory Visit (INDEPENDENT_AMBULATORY_CARE_PROVIDER_SITE_OTHER): Payer: Medicare Other | Admitting: Family

## 2019-03-22 VITALS — BP 128/60 | HR 59 | Temp 98.2°F | Ht 66.0 in | Wt 240.6 lb

## 2019-03-22 DIAGNOSIS — B373 Candidiasis of vulva and vagina: Secondary | ICD-10-CM

## 2019-03-22 DIAGNOSIS — R309 Painful micturition, unspecified: Secondary | ICD-10-CM | POA: Diagnosis not present

## 2019-03-22 DIAGNOSIS — B3731 Acute candidiasis of vulva and vagina: Secondary | ICD-10-CM

## 2019-03-22 DIAGNOSIS — B372 Candidiasis of skin and nail: Secondary | ICD-10-CM

## 2019-03-22 DIAGNOSIS — R350 Frequency of micturition: Secondary | ICD-10-CM | POA: Diagnosis not present

## 2019-03-22 LAB — POCT URINALYSIS DIPSTICK
Glucose, UA: POSITIVE — AB
Ketones, UA: 5
Nitrite, UA: POSITIVE
Protein, UA: POSITIVE — AB
Spec Grav, UA: 1.01 (ref 1.010–1.025)
Urobilinogen, UA: 1 E.U./dL
pH, UA: 6 (ref 5.0–8.0)

## 2019-03-22 MED ORDER — CRANBERRY 475 MG PO CAPS: 475.0000 mg | ORAL_CAPSULE | Freq: Two times a day (BID) | ORAL | 3 refills | Status: AC

## 2019-03-22 MED ORDER — FLUCONAZOLE 100 MG PO TABS: 150.0000 mg | ORAL_TABLET | Freq: Every day | ORAL | 0 refills | Status: DC

## 2019-03-22 MED ORDER — NYSTATIN 100000 UNIT/GM EX CREA: 1.0000 "application " | TOPICAL_CREAM | Freq: Two times a day (BID) | CUTANEOUS | 3 refills | Status: DC

## 2019-03-22 NOTE — Progress Notes (Signed)
Provider: Ifeoma Vallin FNP-C  Gayland Curry, DO  Patient Care Team: Gayland Curry, DO as PCP - General (Geriatric Medicine) Bo Merino, MD (Rheumatology) Altheimer, Legrand Como, MD as Attending Physician (Endocrinology) Newt Minion, MD as Consulting Physician (Orthopedic Surgery)  Extended Emergency Contact Information Primary Emergency Contact: Marlana Latus, [place] Phone: [phone] Relation: Son Secondary Emergency Contact: Jonnie Finner Address: [address]          [place], [place] [address] Johnnette Litter of [place] Phone: [phone] Relation: Son  Code Status:  Full Code  Goals of care: Advanced Directive information Advanced Directives 01/11/2019  Does Patient Have a Medical Advance Directive? Yes  Type of Advance Directive Living will;Healthcare Power of Attorney  Does patient want to make changes to medical advance directive? -  Copy of [place] in Chart? -  Would patient like information on creating a medical advance directive? -  Pre-existing out of facility DNR order (yellow form or pink MOST form) -     Chief Complaint  Patient presents with   Acute Visit    Patient c/o uti symptoms (burning, urgency, and pain) and yeast infection (redness and itching)  states powder and cream are not working, patient states yeast infection duration week and half  she has been using cream and powder and bathing x2 times a day. Uti symptoms patient states never cleared up from beginning of  July.     HPI:  Pt is a 78 y.o. female seen today for an acute visit for evaluation of burning, urgency, and pain and redness and itching.she has used powder and cream are not working.Patient states yeast infection duration week and half  she has been using Nystatin cream and powder and bathing x2 times a day. Uti symptoms persist patient states never cleared up from beginning of July.she has had  increased frequency,urgency and painful urination.also complains of lower abdominal pain.she denies any fever,chills,nausea or vomiting.     Past Medical History:  Diagnosis Date   Abnormality of gait 04/19/2016   Acute infective polyneuritis ([place]) 2002   Allergic rhinitis due to pollen    Chronic pain syndrome    COPD (chronic obstructive pulmonary disease) (HCC)    chronic bronchitis   Depressive disorder, not elsewhere classified    Diabetes mellitus without complication ([place])    Diaphragmatic hernia without mention of obstruction or gangrene    Dyslipidemia    Fibromyalgia    GERD (gastroesophageal reflux disease)    with h/o esophagitis   Guillain-Barre syndrome (HCC)    History of benign thymus tumor    Insomnia, unspecified    Lumbar spinal stenosis 03/30/2018   L4-5 level, severe   Miscarriage 1962   Mixed hyperlipidemia    Morbid obesity ([employer])    Osteoporosis    Pneumonia    Polyneuropathy in diabetes(357.2)    Rheumatoid arthritis(714.0)    Spondylosis, lumbosacral    Spontaneous ecchymoses    Type II or unspecified type diabetes mellitus with peripheral circulatory disorders, uncontrolled(250.72)    Unspecified chronic bronchitis (HCC)    Unspecified essential hypertension    Unspecified hypothyroidism    Unspecified pruritic disorder    Unspecified urinary incontinence    Past Surgical History:  Procedure Laterality Date   ABDOMINAL HYSTERECTOMY  1974   abdominal tumor  2002   APPENDECTOMY     APPLICATION OF A-CELL OF BACK N/A 09/26/2018   Procedure:  With Acell;  Surgeon: Wallace Going, DO;  Location: [facility];  Service: Plastics;  Laterality: N/A;   APPLICATION OF WOUND VAC N/A 09/26/2018   Procedure: Vac placement;  Surgeon: Wallace Going, DO;  Location: [facility];  Service: Plastics;  Laterality: N/A;   BACK SURGERY  1982   BRONCHOSCOPY  2001   CHOLECYSTECTOMY  1984   INCISION AND DRAINAGE OF WOUND N/A 09/26/2018     Procedure: Debridement of spine wound;  Surgeon: Wallace Going, DO;  Location: [facility];  Service: Plastics;  Laterality: N/A;   KNEE SURGERY Bilateral 08/27/2010 (L) and 01/11/2011 (R)   LUMBAR LAMINECTOMY/DECOMPRESSION MICRODISCECTOMY N/A 07/03/2018   Procedure: Lumbar Three to Lumbar Five Laminectomy;  Surgeon: Judith Part, MD;  Location: [facility];  Service: Neurosurgery;  Laterality: N/A;   LUMBAR WOUND DEBRIDEMENT N/A 08/20/2018   Procedure: POSTERIOR LUMBAR SPINAL WOUND DEBRIDEMENT AND REVISION;  Surgeon: Judith Part, MD;  Location: [facility];  Service: Neurosurgery;  Laterality: N/A;  POSTERIOR LUMBAR SPINAL WOUND REVISION   miscarrage  1962   OVARIAN CYST SURGERY  1968   thymus tumor     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   TONSILLECTOMY      Allergies  Allergen Reactions   Influenza Vaccines Other (See Comments)    "h/o Guillain Barre; dr's told me years ago never to take another flu shot as it could relapse Rosalee Kaufman; has to do with when vaccine being changed to H1N1 virus"   Penicillins Hives, Itching, Swelling and Rash    Has patient had a PCN reaction causing immediate rash, facial/tongue/throat swelling, SOB or lightheadedness with hypotension:Unknown Has patient had a PCN reaction causing severe rash involving mucus membranes or skin necrosis: Unknown Has patient had a PCN reaction that required hospitalization:Unknown Has patient had a PCN reaction occurring within the last 10 years: Unknown If all of the above answers are "NO", then may proceed with Cephalosporin use.    Statins Other (See Comments)    Myopathy, transaminitis   Sulfamethoxazole-Trimethoprim Itching    Outpatient Encounter Medications as of 03/22/2019  Medication Sig   acetaminophen (TYLENOL) 325 MG tablet Take 650 mg by mouth every 6 (six) hours as needed (for pain.).   albuterol (PROVENTIL HFA;VENTOLIN HFA) 108 (90 Base) MCG/ACT inhaler Inhale 2 puffs into the lungs every 6 (six)  hours as needed for wheezing or shortness of breath.   amLODipine (NORVASC) 10 MG tablet Take 1 tablet (10 mg total) by mouth daily. Please schedule appointment for refills.   aspirin EC 81 MG tablet Take 81 mg by mouth 2 (two) times a week.   B-D UF III MINI PEN NEEDLES 31G X 5 MM MISC USE AS DIRECTED   docusate sodium (COLACE) 100 MG capsule Take 100 mg by mouth daily as needed (constipation.).    folic acid (FOLVITE) 1 MG tablet Take 2 tablets (2 mg total) by mouth daily.   furosemide (LASIX) 20 MG tablet TAKE 1 TABLET BY MOUTH TWICE DAILY AS NEEDED FOR 3 POUND WEIGHT GAIN IN ONE DAY OR 5 POUNDS IN ONE WEEK   gabapentin (NEURONTIN) 600 MG tablet TAKE 1 TABLET BY MOUTH THREE TIMES DAILY (Patient taking differently: Patient states she is taking 2)   HUMALOG KWIKPEN 200 UNIT/ML SOPN INJECT 20 UNITS UNDER THE SKIN THREE TIMES DAILY BEFORE A MEAL   levothyroxine (SYNTHROID) 137 MCG tablet TAKE 1 TABLET BY MOUTH DAILY BEFORE BREAKFAST ON AN EMPTY STOMACH( LABS OVERDUE)   lisinopril (PRINIVIL,ZESTRIL) 20  MG tablet Take 1 tablet (20 mg total) by mouth daily.   loperamide (IMODIUM A-D) 2 MG tablet Take 2 mg by mouth 4 (four) times daily as needed for diarrhea or loose stools.   magnesium oxide (MAG-OX) 400 MG tablet Take 400 mg by mouth daily as needed (for leg cramps).    Melatonin 10 MG TABS Take 10 mg by mouth at bedtime.   metoprolol tartrate (LOPRESSOR) 50 MG tablet Take 50 mg by mouth 2 (two) times daily.    MYRBETRIQ 50 MG TB24 tablet TAKE 1 TABLET(50 MG) BY MOUTH DAILY   nystatin cream (MYCOSTATIN) Apply 1 application topically 2 (two) times daily. To skin folds   NYSTATIN powder APPLY TOPICALLY THREE TIMES DAILY AS DIRECTED FOR 14 DAYS   omega-3 acid ethyl esters (LOVAZA) 1 g capsule TAKE ONE CAPSULE BY MOUTH EVERY DAY   oxyCODONE (ROXICODONE) 5 MG immediate release tablet Take 1 tablet (5 mg total) by mouth every 6 (six) hours as needed for severe pain.   polyvinyl alcohol  (LIQUIFILM TEARS) 1.4 % ophthalmic solution Place 1 drop into both eyes 4 (four) times daily as needed (for dry/irritated eyes).    potassium chloride (MICRO-K) 10 MEQ CR capsule Take 2 capsules (20 mEq total) by mouth 2 (two) times daily as needed (when taking a dose of lasix.).   tiZANidine (ZANAFLEX) 2 MG tablet TAKE 1 TABLET(2 MG) BY MOUTH THREE TIMES DAILY   TOUJEO SOLOSTAR 300 UNIT/ML SOPN INJECT 100 UNITS UNDER THE SKIN EVERY NIGHT AT BEDTIME   triamterene-hydrochlorothiazide (MAXZIDE-25) 37.5-25 MG tablet Take 1 tablet by mouth daily.   umeclidinium-vilanterol (ANORO ELLIPTA) 62.5-25 MCG/INH AEPB Inhale 1 puff into the lungs daily.   venlafaxine XR (EFFEXOR-XR) 75 MG 24 hr capsule TAKE 1 CAPSULE BY MOUTH EVERY DAY WITH BREAKFAST   VICTOZA 18 MG/3ML SOPN ADMINISTER 1.8 MG UNDER THE SKIN DAILY FOR BLOOD SUGAR   estradiol (ESTRACE) 0.1 MG/GM vaginal cream Place 1 Applicatorful vaginally 3 (three) times a week. (Patient not taking: Reported on 03/22/2019)   [DISCONTINUED] hydroxychloroquine (PLAQUENIL) 200 MG tablet TAKE 1 TABLET(200 MG) BY MOUTH TWICE DAILY   No facility-administered encounter medications on file as of 03/22/2019.     Review of Systems  Constitutional: Negative for appetite change, chills, fatigue and fever.  Respiratory: Negative for cough, chest tightness, shortness of breath and wheezing.   Cardiovascular: Negative for chest pain, palpitations and leg swelling.  Gastrointestinal: Positive for abdominal pain. Negative for abdominal distention, constipation, diarrhea, nausea and vomiting.       Suprapubic pain   Genitourinary: Positive for dysuria, frequency and urgency. Negative for difficulty urinating, vaginal bleeding, vaginal discharge and vaginal pain.  Skin: Negative for color change and pallor.       Itchy,Skin redness under the breast and groin   Psychiatric/Behavioral: Negative for confusion.    Immunization History  Administered Date(s) Administered    Influenza-Unspecified 06/10/2011   Pneumococcal Conjugate-13 07/07/2016   Pneumococcal Polysaccharide-23 11/21/2003, 10/05/2017   Tdap 05/28/2016   Pertinent  Health Maintenance Due  Topic Date Due   OPHTHALMOLOGY EXAM  12/20/1950   FOOT EXAM  09/27/2016   INFLUENZA VACCINE  03/23/2019   HEMOGLOBIN A1C  06/08/2019   DEXA SCAN  09/20/2025   PNA vac Low Risk Adult  Completed   Fall Risk  03/22/2019 02/05/2019 01/11/2019 12/10/2018 10/22/2018  Falls in the past year? 0 0 0 0 0  Number falls in past yr: 0 0 0 0 0  Injury with Fall?  0 0 0 0 0  Risk for fall due to : - - - - History of fall(s);Impaired balance/gait;Medication side effect;Impaired mobility  Follow up - - - - Education provided;Falls prevention discussed    Vitals:   03/22/19 0922  BP: 128/60  Pulse: (!) 59  Temp: 98.2 F (36.8 C)  TempSrc: Oral  SpO2: 95%  Weight: 240 lb 9.6 oz (109.1 kg)  Height: 5\' 6"  (1.676 m)   Body mass index is 38.83 kg/m. Physical Exam Vitals signs reviewed.  Constitutional:      General: She is not in acute distress.    Appearance: She is obese. She is not ill-appearing.  HENT:     Mouth/Throat:     Mouth: Mucous membranes are moist.     Pharynx: Oropharynx is clear. No oropharyngeal exudate or posterior oropharyngeal erythema.  Eyes:     General: No scleral icterus.       Right eye: No discharge.        Left eye: No discharge.     Conjunctiva/sclera: Conjunctivae normal.     Pupils: Pupils are equal, round, and reactive to light.  Cardiovascular:     Rate and Rhythm: Normal rate and regular rhythm.     Pulses: Normal pulses.     Heart sounds: Normal heart sounds. No murmur. No friction rub. No gallop.   Pulmonary:     Effort: Pulmonary effort is normal. No respiratory distress.     Breath sounds: Normal breath sounds. No wheezing, rhonchi or rales.  Chest:     Chest wall: No tenderness.  Abdominal:     General: Bowel sounds are normal. There is no distension.      Palpations: Abdomen is soft. There is no mass.     Tenderness: There is no abdominal tenderness. There is no right CVA tenderness, left CVA tenderness, guarding or rebound.  Musculoskeletal:        General: No swelling or tenderness.     Right lower leg: No edema.     Left lower leg: No edema.     Comments: Unsteady gait self propels on wheelchair and transfers without any assistance.   Skin:    General: Skin is warm and dry.     Coloration: Skin is not pale.     Findings: No bruising or lesion.     Comments: Right breast, bilateral abdominal folds and Vulva skin has extensive  beefy redness noted.   Neurological:     Mental Status: She is alert and oriented to person, place, and time.     Cranial Nerves: No cranial nerve deficit.     Gait: Gait abnormal.  Psychiatric:        Mood and Affect: Mood normal.        Behavior: Behavior normal.        Thought Content: Thought content normal.        Judgment: Judgment normal.    Labs reviewed: Recent Labs    08/22/18 0447 09/26/18 1016 12/07/18 0956  NA 135 141 143  K 4.0 3.7 4.1  CL 99 102 104  CO2 28 24 32  GLUCOSE 270* 153* 116*  BUN 22 22 16   CREATININE 0.93 0.94 0.91  CALCIUM 8.5* 9.8 9.6  PHOS 2.5  --   --    Recent Labs    04/06/18 0930 08/09/18 1224 08/22/18 0447 12/07/18 0956  AST 41* 17  --  14  ALT 18 9  --  10  BILITOT 0.4 0.4  --  0.4  PROT 6.3 6.5  --  6.9  ALBUMIN  --   --  2.6*  --    Recent Labs    04/06/18 0930  08/09/18 1224 08/17/18 1817 09/26/18 1016 12/07/18 0956  WBC 14.5*   < > 17.2* 13.3* 16.8* 12.9*  NEUTROABS 10,701*  --  12,264*  --   --  9,520*  HGB 13.2   < > 13.9 12.9 13.9 14.0  HCT 39.7   < > 42.9 41.8 45.6 43.4  MCV 83.8   < > 84.8 87.1 85.6 80.8  PLT 346   < > 430* 292 387 339   < > = values in this interval not displayed.   Lab Results  Component Value Date   TSH 3.75 12/07/2018   Lab Results  Component Value Date   HGBA1C 7.4 (H) 12/07/2018   Lab Results    Component Value Date   CHOL 142 12/07/2018   HDL 38 (L) 12/07/2018   LDLCALC 83 12/07/2018   TRIG 118 12/07/2018   CHOLHDL 3.7 12/07/2018    Significant Diagnostic Results in last 30 days:  No results found.  Assessment/Plan 1. Urinary frequency Increased urine incontinency. - POC Urinalysis Dipstick results shows foul ,cloudy urine positive Glucose,protein,Nitrite with moderate leukocytes.Patient afebrile.will hold on treating with antibiotics until cultures are resulted to prevent ABXs resistance.  - Clean peri-area from front to back with  incontinent care  - Culture, Urine  2. Painful urination - POC Urinalysis Dipstick - Take over the counter Cranberry tablet one by mouth twice daily to prevent urinary tract infection. - Culture, Urine  3. Candidal skin infection Right breast fold and bilateral groin areas extensive beefy skin redness.  - encouraged to Keep abdominal and breast skin fold dry after shower.Wear bra as tolerated to prevent skin moisture. - nystatin cream (MYCOSTATIN); Apply 1 application topically 2 (two) times daily. To skin folds  Dispense: 30 g; Refill: 3  4. Candidiasis, vagina Recurrent Beefy skin redness.Diflucan 100 mg tablet one by mouth daily x 7 days.    Family/ staff Communication: Reviewed plan of care with patient.  Labs/tests ordered:  - POC Urinalysis Dipstick  - Culture, Urine  Time spent with patient 25 minutes >50% time spent counseling; reviewing medical record; tests; labs; and developing future plan of care  Sandrea Hughs, NP

## 2019-03-22 NOTE — Patient Instructions (Signed)
1. Take Diflucan 100 mg tablet one by mouth daily x 7 days   2. Keep abdominal and breast skin fold dry after shower apply Nystatin cream twice daily.   3. Clean peri-area from front to back with  incontinent care   4. Take over the counter Cranberry tablet one by mouth twice daily to prevent urinary tract infection.

## 2019-03-24 LAB — URINE CULTURE
MICRO NUMBER:: 726477
SPECIMEN QUALITY:: ADEQUATE

## 2019-03-25 ENCOUNTER — Telehealth: Payer: Self-pay

## 2019-03-25 ENCOUNTER — Other Ambulatory Visit: Payer: Self-pay

## 2019-03-25 MED ORDER — SACCHAROMYCES BOULARDII 250 MG PO CAPS: 250.0000 mg | ORAL_CAPSULE | Freq: Two times a day (BID) | ORAL | 0 refills | Status: AC

## 2019-03-25 MED ORDER — SULFAMETHOXAZOLE-TRIMETHOPRIM 800-160 MG PO TABS: 1.0000 | ORAL_TABLET | Freq: Two times a day (BID) | ORAL | 0 refills | Status: AC

## 2019-03-25 NOTE — Telephone Encounter (Signed)
Patient states she thought Dinah had told her to take 1 and 1/2 tabs of Diflucan she was prescribed and pharmacy did not give her enough pills. Pharmacy told her medication was only written to take 1 pill by mouth daily.   Spoke with provider and verified instructions. Dinah stated that instructions pharmacy gave her was correct. Just take 1 tablet by mouth daily for 7 days.   Tried calling patient to inform patient instructions given by pharmacy was correct. She verbalized understanding and denied further questions.

## 2019-04-02 ENCOUNTER — Telehealth: Payer: Self-pay

## 2019-04-02 ENCOUNTER — Encounter: Payer: Self-pay | Admitting: Rheumatology

## 2019-04-02 ENCOUNTER — Other Ambulatory Visit: Payer: Self-pay

## 2019-04-02 ENCOUNTER — Ambulatory Visit (INDEPENDENT_AMBULATORY_CARE_PROVIDER_SITE_OTHER): Payer: Medicare Other | Admitting: Rheumatology

## 2019-04-02 VITALS — BP 120/54 | HR 64 | Resp 20 | Ht 66.0 in | Wt 238.2 lb

## 2019-04-02 DIAGNOSIS — M170 Bilateral primary osteoarthritis of knee: Secondary | ICD-10-CM

## 2019-04-02 DIAGNOSIS — M51369 Other intervertebral disc degeneration, lumbar region without mention of lumbar back pain or lower extremity pain: Secondary | ICD-10-CM

## 2019-04-02 DIAGNOSIS — M810 Age-related osteoporosis without current pathological fracture: Secondary | ICD-10-CM

## 2019-04-02 DIAGNOSIS — Z79899 Other long term (current) drug therapy: Secondary | ICD-10-CM

## 2019-04-02 DIAGNOSIS — M5136 Other intervertebral disc degeneration, lumbar region: Secondary | ICD-10-CM | POA: Diagnosis not present

## 2019-04-02 DIAGNOSIS — Z8669 Personal history of other diseases of the nervous system and sense organs: Secondary | ICD-10-CM | POA: Diagnosis not present

## 2019-04-02 DIAGNOSIS — Z8639 Personal history of other endocrine, nutritional and metabolic disease: Secondary | ICD-10-CM

## 2019-04-02 DIAGNOSIS — M797 Fibromyalgia: Secondary | ICD-10-CM | POA: Diagnosis not present

## 2019-04-02 DIAGNOSIS — G4709 Other insomnia: Secondary | ICD-10-CM | POA: Diagnosis not present

## 2019-04-02 DIAGNOSIS — Z8679 Personal history of other diseases of the circulatory system: Secondary | ICD-10-CM

## 2019-04-02 DIAGNOSIS — Z8719 Personal history of other diseases of the digestive system: Secondary | ICD-10-CM

## 2019-04-02 DIAGNOSIS — M0579 Rheumatoid arthritis with rheumatoid factor of multiple sites without organ or systems involvement: Secondary | ICD-10-CM

## 2019-04-02 DIAGNOSIS — Z8659 Personal history of other mental and behavioral disorders: Secondary | ICD-10-CM

## 2019-04-02 DIAGNOSIS — M3501 Sicca syndrome with keratoconjunctivitis: Secondary | ICD-10-CM | POA: Diagnosis not present

## 2019-04-02 DIAGNOSIS — Z8709 Personal history of other diseases of the respiratory system: Secondary | ICD-10-CM

## 2019-04-02 NOTE — Telephone Encounter (Signed)
Pending lab results, please send in prescription for arava. Patient dose will be Arava 10 mg 1 tablet by mouth for one month and recheck labs at that time.  If her labs are stable, she will increase to Arava 20 mg po daily. Thanks!

## 2019-04-02 NOTE — Patient Instructions (Addendum)
Standing Labs We placed an order today for your standing lab work.    Please come back and get your standing labs in 1 month x2 and then every 3 months.  We have open lab daily Monday through Thursday from 8:30-12:30 PM and 1:30-4:30 PM and Friday from 8:30-12:30 PM and 1:30 -4:00 PM at the office of Dr. Bo Merino.   You may experience shorter wait times on Monday and Friday afternoons. The office is located at [address], [place], [place], [place] [address] No appointment is necessary.   Labs are drawn by [facility].  You may receive a bill from [facility] for your lab work.  If you wish to have your labs drawn at another location, please call the office 24 hours in advance to send orders.  If you have any questions regarding directions or hours of operation,  please call [phone].   Just as a reminder please drink plenty of water prior to coming for your lab work. Thanks!   Leflunomide tablets What is this medicine? LEFLUNOMIDE (le FLOO na mide) is for rheumatoid arthritis. This medicine may be used for other purposes; ask your health care provider or pharmacist if you have questions. COMMON BRAND NAME(S): Arava What should I tell my health care provider before I take this medicine? They need to know if you have any of these conditions:  diabetes  have a fever or infection  high blood pressure  immune system problems  kidney disease  liver disease  low blood cell counts, like low white cell, platelet, or red cell counts  lung or breathing disease, like asthma  recently received or scheduled to receive a vaccine  receiving treatment for cancer  skin conditions or sensitivity  tingling of the fingers or toes, or other nerve disorder  tuberculosis  an unusual or allergic reaction to leflunomide, teriflunomide, other medicines, food, dyes, or preservatives  pregnant or trying to get pregnant  breast-feeding How should I use this medicine? Take this  medicine by mouth with a full glass of water. Follow the directions on the prescription label. Take your medicine at regular intervals. Do not take your medicine more often than directed. Do not stop taking except on your doctor's advice. Talk to your pediatrician regarding the use of this medicine in children. Special care may be needed. Overdosage: If you think you have taken too much of this medicine contact a poison control center or emergency room at once. NOTE: This medicine is only for you. Do not share this medicine with others. What if I miss a dose? If you miss a dose, take it as soon as you can. If it is almost time for your next dose, take only that dose. Do not take double or extra doses. What may interact with this medicine? Do not take this medicine with any of the following medications:  teriflunomide This medicine may also interact with the following medications:  alosetron  birth control pills  caffeine  cefaclor  certain medicines for diabetes like nateglinide, repaglinide, rosiglitazone, pioglitazone  certain medicines for high cholesterol like atorvastatin, pravastatin, rosuvastatin, simvastatin  charcoal  cholestyramine  ciprofloxacin  duloxetine  furosemide  ketoprofen  live virus vaccines  medicines that increase your risk for infection  methotrexate  mitoxantrone  paclitaxel  penicillin  theophylline  tizanidine  warfarin This list may not describe all possible interactions. Give your health care provider a list of all the medicines, herbs, non-prescription drugs, or dietary supplements you use. Also tell them  if you smoke, drink alcohol, or use illegal drugs. Some items may interact with your medicine. What should I watch for while using this medicine? Visit your health care provider for regular checks on your progress. Tell your doctor or health care provider if your symptoms do not start to get better or if they get worse. You may  need blood work done while you are taking this medicine. This medicine may cause serious skin reactions. They can happen weeks to months after starting the medicine. Contact your health care provider right away if you notice fevers or flu-like symptoms with a rash. The rash may be red or purple and then turn into blisters or peeling of the skin. Or, you might notice a red rash with swelling of the face, lips or lymph nodes in your neck or under your arms. This medicine may stay in your body for up to 2 years after your last dose. Tell your doctor about any unusual side effects or symptoms. A medicine can be given to help lower your blood levels of this medicine more quickly. Women must use effective birth control with this medicine. There is a potential for serious side effects to an unborn child. Do not become pregnant while taking this medicine. Inform your doctor if you wish to become pregnant. This medicine remains in your blood after you stop taking it. You must continue using effective birth control until the blood levels have been checked and they are low enough. A medicine can be given to help lower your blood levels of this medicine more quickly. Immediately talk to your doctor if you think you may be pregnant. You may need a pregnancy test. Talk to your health care provider or pharmacist for more information. You should not receive certain vaccines during your treatment and for a certain time after your treatment with this medication ends. Talk to your health care provider for more information. What side effects may I notice from receiving this medicine? Side effects that you should report to your doctor or health care professional as soon as possible:  allergic reactions like skin rash, itching or hives, swelling of the face, lips, or tongue  breathing problems  cough  increased blood pressure  low blood counts - this medicine may decrease the number of white blood cells and platelets. You  may be at increased risk for infections and bleeding.  pain, tingling, numbness in the hands or feet  rash, fever, and swollen lymph nodes  redness, blistering, peeing or loosening of the skin, including inside the mouth  signs of decreased platelets or bleeding - bruising, pinpoint red spots on the skin, black, tarry stools, blood in urine  signs of infection - fever or chills, cough, sore throat, pain or trouble passing urine  signs and symptoms of liver injury like dark yellow or brown urine; general ill feeling or flu-like symptoms; light-colored stools; loss of appetite; nausea; right upper belly pain; unusually weak or tired; yellowing of the eyes or skin  trouble passing urine or change in the amount of urine  vomiting Side effects that usually do not require medical attention (report to your doctor or health care professional if they continue or are bothersome):  diarrhea  hair thinning or loss  headache  nausea  tiredness This list may not describe all possible side effects. Call your doctor for medical advice about side effects. You may report side effects to FDA at 1-800-FDA-1088. Where should I keep my medicine? Keep out of the  reach of children. Store at room temperature between 15 and 30 degrees C (59 and 86 degrees F). Protect from moisture and light. Throw away any unused medicine after the expiration date. NOTE: This sheet is a summary. It may not cover all possible information. If you have questions about this medicine, talk to your doctor, pharmacist, or health care provider.  2020 Elsevier/Gold Standard (2018-11-09 15:06:48)

## 2019-04-03 ENCOUNTER — Other Ambulatory Visit: Payer: Self-pay | Admitting: Internal Medicine

## 2019-04-04 NOTE — Telephone Encounter (Signed)
I reviewed the labs on the patient.  Has low GFR most likely related to Lasix.  The plan was to start her on Arava 10 mg p.o. daily for 1 month.  If her labs are normal in 4 weeks then we will increase the dose to 20 mg p.o. daily and repeat labs in 4 weeks again.  After that labs can be checked every 2 months and if stable every 3 months.  Please call in the prescription for Katherine Delgado.

## 2019-04-05 ENCOUNTER — Other Ambulatory Visit: Payer: Self-pay | Admitting: Rheumatology

## 2019-04-05 MED ORDER — LEFLUNOMIDE 10 MG PO TABS: 10.0000 mg | ORAL_TABLET | Freq: Every day | ORAL | 0 refills | Status: DC

## 2019-04-05 NOTE — Telephone Encounter (Signed)
Spoke with patient and advised we are able to start her on the [place]. Patient advised to return in 4 weeks for labs. Patient advised if they are normal then we will increase her to 20 mg of Arava. Patient advised then we will repeat labs 4 weeks after that. Patient verbalized understanding.

## 2019-04-05 NOTE — Addendum Note (Signed)
Addended by: Carole Binning on: 04/05/2019 11:01 AM   Modules accepted: Orders

## 2019-04-08 ENCOUNTER — Other Ambulatory Visit: Payer: Self-pay | Admitting: Internal Medicine

## 2019-04-08 LAB — PROTEIN ELECTROPHORESIS, SERUM, WITH REFLEX
Albumin ELP: 3.5 g/dL — ABNORMAL LOW (ref 3.8–4.8)
Alpha 1: 0.4 g/dL — ABNORMAL HIGH (ref 0.2–0.3)
Alpha 2: 0.9 g/dL (ref 0.5–0.9)
Beta 2: 0.5 g/dL (ref 0.2–0.5)
Beta Globulin: 0.5 g/dL (ref 0.4–0.6)
Gamma Globulin: 0.9 g/dL (ref 0.8–1.7)
Total Protein: 6.7 g/dL (ref 6.1–8.1)

## 2019-04-08 LAB — COMPLETE METABOLIC PANEL WITH GFR
AG Ratio: 1.4 (calc) (ref 1.0–2.5)
ALT: 12 U/L (ref 6–29)
AST: 16 U/L (ref 10–35)
Albumin: 4 g/dL (ref 3.6–5.1)
Alkaline phosphatase (APISO): 112 U/L (ref 37–153)
BUN/Creatinine Ratio: 27 (calc) — ABNORMAL HIGH (ref 6–22)
BUN: 31 mg/dL — ABNORMAL HIGH (ref 7–25)
CO2: 29 mmol/L (ref 20–32)
Calcium: 9.8 mg/dL (ref 8.6–10.4)
Chloride: 103 mmol/L (ref 98–110)
Creat: 1.15 mg/dL — ABNORMAL HIGH (ref 0.60–0.93)
GFR, Est African American: 53 mL/min/{1.73_m2} — ABNORMAL LOW (ref >=60)
GFR, Est Non African American: 46 mL/min/{1.73_m2} — ABNORMAL LOW (ref >=60)
Globulin: 2.9 g/dL (calc) (ref 1.9–3.7)
Glucose, Bld: 171 mg/dL — ABNORMAL HIGH (ref 65–99)
Potassium: 5.3 mmol/L (ref 3.5–5.3)
Sodium: 140 mmol/L (ref 135–146)
Total Bilirubin: 0.3 mg/dL (ref 0.2–1.2)
Total Protein: 6.9 g/dL (ref 6.1–8.1)

## 2019-04-08 LAB — QUANTIFERON-TB GOLD PLUS
Mitogen-NIL: >10 IU/mL
NIL: 0.05 IU/mL
QuantiFERON-TB Gold Plus: NEGATIVE
TB1-NIL: 0 IU/mL
TB2-NIL: 0.01 IU/mL

## 2019-04-08 LAB — CBC WITH DIFFERENTIAL/PLATELET
Absolute Monocytes: 987 cells/uL — ABNORMAL HIGH (ref 200–950)
Basophils Absolute: 155 cells/uL (ref 0–200)
Basophils Relative: 1.1 %
Eosinophils Absolute: 620 cells/uL — ABNORMAL HIGH (ref 15–500)
Eosinophils Relative: 4.4 %
HCT: 41.5 % (ref 35.0–45.0)
Hemoglobin: 13.5 g/dL (ref 11.7–15.5)
Lymphs Abs: 2016 cells/uL (ref 850–3900)
MCH: 27.1 pg (ref 27.0–33.0)
MCHC: 32.5 g/dL (ref 32.0–36.0)
MCV: 83.3 fL (ref 80.0–100.0)
MPV: 10.7 fL (ref 7.5–12.5)
Monocytes Relative: 7 %
Neutro Abs: 10321 cells/uL — ABNORMAL HIGH (ref 1500–7800)
Neutrophils Relative %: 73.2 %
Platelets: 343 10*3/uL (ref 140–400)
RBC: 4.98 10*6/uL (ref 3.80–5.10)
RDW: 13.3 % (ref 11.0–15.0)
Total Lymphocyte: 14.3 %
WBC: 14.1 10*3/uL — ABNORMAL HIGH (ref 3.8–10.8)

## 2019-04-08 LAB — IGG, IGA, IGM
IgG (Immunoglobin G), Serum: 937 mg/dL (ref 600–1540)
IgM, Serum: 101 mg/dL (ref 50–300)
Immunoglobulin A: 373 mg/dL — ABNORMAL HIGH (ref 70–320)

## 2019-04-08 LAB — IFE INTERPRETATION: Immunofix Electr Int: NOT DETECTED

## 2019-04-08 LAB — HEPATITIS B SURFACE ANTIGEN: Hepatitis B Surface Ag: NONREACTIVE

## 2019-04-08 LAB — HEPATITIS C ANTIBODY
Hepatitis C Ab: NONREACTIVE
SIGNAL TO CUT-OFF: 0.02 (ref <1.00)

## 2019-04-08 LAB — SEDIMENTATION RATE: Sed Rate: 29 mm/h (ref 0–30)

## 2019-04-08 LAB — HEPATITIS B CORE ANTIBODY, IGM: Hep B C IgM: NONREACTIVE

## 2019-04-08 NOTE — Progress Notes (Signed)
Creatinine is elevated.  Most likely due to the use of Lasix.  She is also diabetic. Please forward the labs to her PCP.

## 2019-04-09 ENCOUNTER — Other Ambulatory Visit: Payer: Self-pay

## 2019-04-09 ENCOUNTER — Encounter: Payer: Self-pay | Admitting: Family

## 2019-04-09 ENCOUNTER — Other Ambulatory Visit: Payer: Self-pay | Admitting: Family

## 2019-04-09 ENCOUNTER — Ambulatory Visit (INDEPENDENT_AMBULATORY_CARE_PROVIDER_SITE_OTHER): Payer: Medicare Other | Admitting: Family

## 2019-04-09 ENCOUNTER — Other Ambulatory Visit: Payer: Medicare Other

## 2019-04-09 VITALS — BP 118/78 | HR 65 | Temp 98.2°F | Ht 66.0 in | Wt 236.0 lb

## 2019-04-09 DIAGNOSIS — B372 Candidiasis of skin and nail: Secondary | ICD-10-CM | POA: Diagnosis not present

## 2019-04-09 DIAGNOSIS — Z794 Long term (current) use of insulin: Secondary | ICD-10-CM

## 2019-04-09 DIAGNOSIS — E1142 Type 2 diabetes mellitus with diabetic polyneuropathy: Secondary | ICD-10-CM | POA: Diagnosis not present

## 2019-04-09 DIAGNOSIS — I10 Essential (primary) hypertension: Secondary | ICD-10-CM

## 2019-04-09 MED ORDER — METOPROLOL TARTRATE 50 MG PO TABS: 50.0000 mg | ORAL_TABLET | Freq: Two times a day (BID) | ORAL | 0 refills | Status: DC

## 2019-04-09 MED ORDER — NYSTATIN 100000 UNIT/GM EX POWD: CUTANEOUS | 0 refills | Status: DC

## 2019-04-09 MED ORDER — FLUCONAZOLE 150 MG PO TABS: 150.0000 mg | ORAL_TABLET | Freq: Once | ORAL | 0 refills | Status: AC

## 2019-04-09 NOTE — Patient Instructions (Signed)
Skin Yeast Infection  A skin yeast infection is a condition in which there is an overgrowth of yeast (candida) that normally lives on the skin. This condition usually occurs in areas of the skin that are constantly warm and moist, such as the armpits or the groin. What are the causes? This condition is caused by a change in the normal balance of the yeast and bacteria that live on the skin. What increases the risk? You are more likely to develop this condition if you:  Are obese.  Are pregnant.  Take birth control pills.  Have diabetes.  Take antibiotic medicines.  Take steroid medicines.  Are malnourished.  Have a weak body defense system (immune system).  Are 39 years of age or older.  Wear tight clothing. What are the signs or symptoms? The most common symptom of this condition is itchiness in the affected area. Other symptoms include:  Red, swollen area of the skin.  Bumps on the skin. How is this diagnosed?  This condition is diagnosed with a medical history and physical exam.  Your health care provider may check for yeast by taking light scrapings of the skin to be viewed under a microscope. How is this treated? This condition is treated with medicine. Medicines may be prescribed or be available over the counter. The medicines may be:  Taken by mouth (orally).  Applied as a cream or powder to your skin. Follow these instructions at home:   Take or apply over-the-counter and prescription medicines only as told by your health care provider.  Maintain a healthy weight. If you need help losing weight, talk with your health care provider.  Keep your skin clean and dry.  If you have diabetes, keep your blood sugar under control.  Keep all follow-up visits as told by your health care provider. This is important. Contact a health care provider if:  Your symptoms go away and then return.  Your symptoms do not get better with treatment.  Your symptoms get  worse.  Your rash spreads.  You have a fever or chills.  You have new symptoms.  You have new warmth or redness of your skin. Summary  A skin yeast infection is a condition in which there is an overgrowth of yeast (candida) that normally lives on the skin. This condition is caused by a change in the normal balance of the yeast and bacteria that live on the skin.  Take or apply over-the-counter and prescription medicines only as told by your health care provider.  Keep your skin clean and dry.  Contact a health care provider if your symptoms do not get better with treatment. This information is not intended to replace advice given to you by your health care provider. Make sure you discuss any questions you have with your health care provider. Document Released: 04/26/2011 Document Revised: 12/26/2017 Document Reviewed: 12/26/2017 Elsevier Patient Education  2020 [employer].

## 2019-04-09 NOTE — Progress Notes (Signed)
Provider: Daud Cayer FNP-C  Gayland Curry, DO  Patient Care Team: Gayland Curry, DO as PCP - General (Geriatric Medicine) Bo Merino, MD (Rheumatology) Altheimer, Legrand Como, MD as Attending Physician (Endocrinology) Newt Minion, MD as Consulting Physician (Orthopedic Surgery)  Extended Emergency Contact Information Primary Emergency Contact: Marlana Latus, [place] Phone: [phone] Relation: Son Secondary Emergency Contact: Jonnie Finner Address: [address]          [place], [place] [address] [place] Phone: [phone] Relation: Son  Code Status:  Full Code  Goals of care: Advanced Directive information Advanced Directives 01/11/2019  Does Patient Have a Medical Advance Directive? Yes  Type of Advance Directive Living will;Healthcare Power of Attorney  Does patient want to make changes to medical advance directive? -  Copy of [place] in Chart? -  Would patient like information on creating a medical advance directive? -  Pre-existing out of facility DNR order (yellow form or pink MOST form) -     Chief Complaint  Patient presents with   Acute Visit    Yeast infection not cleared up patient states finished all medication prescribed     HPI:  Pt is a 78 y.o. female seen today  for an acute visit for evaluation of yeast. She states completed previous prescribed antibiotics for UTI with much improvement of symptoms.she states bilateral breast and groin areas skin redness persist.she took Diflucan as directed.she applies Nystatin powder.states needs refill for the powder.she denies any fever,chills or cough. She declines to wear a bra thinks the bra irritates the skin.    Past Medical History:  Diagnosis Date   Abnormality of gait 04/19/2016   Acute infective polyneuritis ([place]) 2002   Allergic rhinitis due to pollen    Chronic pain syndrome    COPD  (chronic obstructive pulmonary disease) (HCC)    chronic bronchitis   Depressive disorder, not elsewhere classified    Diabetes mellitus without complication ([place])    Diaphragmatic hernia without mention of obstruction or gangrene    Dyslipidemia    Fibromyalgia    GERD (gastroesophageal reflux disease)    with h/o esophagitis   Guillain-Barre syndrome (HCC)    History of benign thymus tumor    Insomnia, unspecified    Lumbar spinal stenosis 03/30/2018   L4-5 level, severe   Miscarriage 1962   Mixed hyperlipidemia    Morbid obesity ([place])    Osteoporosis    Pneumonia    Polyneuropathy in diabetes(357.2)    Rheumatoid arthritis(714.0)    Spondylosis, lumbosacral    Spontaneous ecchymoses    Type II or unspecified type diabetes mellitus with peripheral circulatory disorders, uncontrolled(250.72)    Unspecified chronic bronchitis (HCC)    Unspecified essential hypertension    Unspecified hypothyroidism    Unspecified pruritic disorder    Unspecified urinary incontinence    Past Surgical History:  Procedure Laterality Date   ABDOMINAL HYSTERECTOMY  1974   abdominal tumor  2002   APPENDECTOMY     APPLICATION OF A-CELL OF BACK N/A 09/26/2018   Procedure: With Acell;  Surgeon: Wallace Going, DO;  Location: [facility];  Service: Plastics;  Laterality: N/A;   APPLICATION OF WOUND VAC N/A 09/26/2018   Procedure: Vac placement;  Surgeon: Wallace Going, DO;  Location: [facility];  Service: Plastics;  Laterality: N/A;   [place]   BRONCHOSCOPY  2001  CHOLECYSTECTOMY  1984   INCISION AND DRAINAGE OF WOUND N/A 09/26/2018   Procedure: Debridement of spine wound;  Surgeon: Wallace Going, DO;  Location: [facility];  Service: Plastics;  Laterality: N/A;   KNEE SURGERY Bilateral 08/27/2010 (L) and 01/11/2011 (R)   LUMBAR LAMINECTOMY/DECOMPRESSION MICRODISCECTOMY N/A 07/03/2018   Procedure: Lumbar Three to Lumbar Five Laminectomy;  Surgeon:  Judith Part, MD;  Location: [facility];  Service: Neurosurgery;  Laterality: N/A;   LUMBAR WOUND DEBRIDEMENT N/A 08/20/2018   Procedure: POSTERIOR LUMBAR SPINAL WOUND DEBRIDEMENT AND REVISION;  Surgeon: Judith Part, MD;  Location: [facility];  Service: Neurosurgery;  Laterality: N/A;  POSTERIOR LUMBAR SPINAL WOUND REVISION   miscarrage  1962   OVARIAN CYST SURGERY  1968   thymus tumor     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   TONSILLECTOMY      Allergies  Allergen Reactions   Influenza Vaccines Other (See Comments)    "h/o Guillain Barre; dr's told me years ago never to take another flu shot as it could relapse Rosalee Kaufman; has to do with when vaccine being changed to H1N1 virus"   Penicillins Hives, Itching, Swelling and Rash    Has patient had a PCN reaction causing immediate rash, facial/tongue/throat swelling, SOB or lightheadedness with hypotension:Unknown Has patient had a PCN reaction causing severe rash involving mucus membranes or skin necrosis: Unknown Has patient had a PCN reaction that required hospitalization:Unknown Has patient had a PCN reaction occurring within the last 10 years: Unknown If all of the above answers are "NO", then may proceed with Cephalosporin use.    Statins Other (See Comments)    Myopathy, transaminitis   Sulfamethoxazole-Trimethoprim Itching    Outpatient Encounter Medications as of 04/09/2019  Medication Sig   acetaminophen (TYLENOL) 325 MG tablet Take 650 mg by mouth every 6 (six) hours as needed (for pain.).   albuterol (PROVENTIL HFA;VENTOLIN HFA) 108 (90 Base) MCG/ACT inhaler Inhale 2 puffs into the lungs every 6 (six) hours as needed for wheezing or shortness of breath.   amLODipine (NORVASC) 10 MG tablet Take 1 tablet (10 mg total) by mouth daily. Please schedule appointment for refills.   aspirin EC 81 MG tablet Take 81 mg by mouth 2 (two) times a week.   B-D UF III MINI PEN NEEDLES 31G X 5 MM MISC USE AS DIRECTED   Cranberry 475  MG CAPS Take 1 capsule (475 mg total) by mouth 2 (two) times daily.   docusate sodium (COLACE) 100 MG capsule Take 100 mg by mouth daily as needed (constipation.).    estradiol (ESTRACE) 0.1 MG/GM vaginal cream Place 1 Applicatorful vaginally 3 (three) times a week.   folic acid (FOLVITE) 1 MG tablet Take 2 tablets (2 mg total) by mouth daily.   furosemide (LASIX) 20 MG tablet TAKE 1 TABLET BY MOUTH TWICE DAILY AS NEEDED FOR 3 POUND WEIGHT GAIN IN ONE DAY OR 5 POUNDS IN ONE WEEK   gabapentin (NEURONTIN) 600 MG tablet TAKE 1 TABLET BY MOUTH THREE TIMES DAILY (Patient taking differently: Patient states she is taking 2)   HUMALOG KWIKPEN 200 UNIT/ML SOPN INJECT 20 UNITS UNDER THE SKIN THREE TIMES DAILY BEFORE A MEAL   leflunomide (ARAVA) 10 MG tablet Take 1 tablet (10 mg total) by mouth daily.   levothyroxine (SYNTHROID) 137 MCG tablet TAKE 1 TABLET BY MOUTH DAILY BEFORE BREAKFAST ON AN EMPTY STOMACH( LABS OVERDUE)   lisinopril (PRINIVIL,ZESTRIL) 20 MG tablet Take 1 tablet (20 mg total) by mouth  daily.   loperamide (IMODIUM A-D) 2 MG tablet Take 2 mg by mouth 4 (four) times daily as needed for diarrhea or loose stools.   magnesium oxide (MAG-OX) 400 MG tablet Take 400 mg by mouth daily as needed (for leg cramps).    Melatonin 10 MG TABS Take 10 mg by mouth at bedtime.   metoprolol tartrate (LOPRESSOR) 50 MG tablet Take 50 mg by mouth 2 (two) times daily.    MYRBETRIQ 50 MG TB24 tablet TAKE 1 TABLET(50 MG) BY MOUTH DAILY   nystatin cream (MYCOSTATIN) Apply 1 application topically 2 (two) times daily. To skin folds   NYSTATIN powder APPLY TOPICALLY THREE TIMES DAILY AS DIRECTED FOR 14 DAYS   omega-3 acid ethyl esters (LOVAZA) 1 g capsule TAKE ONE CAPSULE BY MOUTH EVERY DAY   oxyCODONE (ROXICODONE) 5 MG immediate release tablet Take 1 tablet (5 mg total) by mouth every 6 (six) hours as needed for severe pain.   polyvinyl alcohol (LIQUIFILM TEARS) 1.4 % ophthalmic solution Place 1 drop  into both eyes 4 (four) times daily as needed (for dry/irritated eyes).    potassium chloride (MICRO-K) 10 MEQ CR capsule Take 2 capsules (20 mEq total) by mouth 2 (two) times daily as needed (when taking a dose of lasix.).   tiZANidine (ZANAFLEX) 2 MG tablet TAKE 1 TABLET(2 MG) BY MOUTH THREE TIMES DAILY   TOUJEO SOLOSTAR 300 UNIT/ML SOPN INJECT 100 UNITS INTO THE SKIN EVERY NIGHT AT BEDTIME   triamterene-hydrochlorothiazide (MAXZIDE-25) 37.5-25 MG tablet Take 1 tablet by mouth daily.   umeclidinium-vilanterol (ANORO ELLIPTA) 62.5-25 MCG/INH AEPB Inhale 1 puff into the lungs daily.   venlafaxine XR (EFFEXOR-XR) 75 MG 24 hr capsule TAKE 1 CAPSULE BY MOUTH EVERY DAY WITH BREAKFAST   VICTOZA 18 MG/3ML SOPN ADMINISTER 1.8 MG UNDER THE SKIN DAILY FOR BLOOD SUGAR   No facility-administered encounter medications on file as of 04/09/2019.     Review of Systems  Constitutional: Negative for chills, fatigue and fever.  Respiratory: Negative for cough, chest tightness, shortness of breath and wheezing.   Cardiovascular: Negative for chest pain, palpitations and leg swelling.  Gastrointestinal: Negative for abdominal distention, abdominal pain, constipation, diarrhea, nausea and vomiting.  Genitourinary: Negative for difficulty urinating, dysuria, flank pain, frequency, urgency, vaginal discharge and vaginal pain.  Musculoskeletal: Positive for gait problem.  Skin: Positive for color change. Negative for pallor.       Redness under breast and groin areas.worst under the breast.  Neurological: Negative for dizziness, light-headedness and headaches.    Immunization History  Administered Date(s) Administered   Influenza-Unspecified 06/10/2011   Pneumococcal Conjugate-13 07/07/2016   Pneumococcal Polysaccharide-23 11/21/2003, 10/05/2017   Tdap 05/28/2016   Pertinent  Health Maintenance Due  Topic Date Due   OPHTHALMOLOGY EXAM  12/20/1950   FOOT EXAM  09/27/2016   INFLUENZA VACCINE   03/23/2019   HEMOGLOBIN A1C  06/08/2019   DEXA SCAN  09/20/2025   PNA vac Low Risk Adult  Completed   Fall Risk  04/09/2019 03/22/2019 02/05/2019 01/11/2019 12/10/2018  Falls in the past year? 0 0 0 0 0  Number falls in past yr: 0 0 0 0 0  Injury with Fall? 0 0 0 0 0  Risk for fall due to : - - - - -  Follow up - - - - -    Vitals:   04/09/19 0828  BP: 118/78  Pulse: 65  Temp: 98.2 F (36.8 C)  TempSrc: Oral  SpO2: 95%  Weight: 236  lb (107 kg)  Height: 5\' 6"  (1.676 m)   Body mass index is 38.09 kg/m. Physical Exam Vitals signs reviewed.  Constitutional:      General: She is not in acute distress.    Appearance: She is obese. She is not ill-appearing.  Eyes:     General: No scleral icterus.       Right eye: No discharge.        Left eye: No discharge.     Conjunctiva/sclera: Conjunctivae normal.     Pupils: Pupils are equal, round, and reactive to light.  Cardiovascular:     Rate and Rhythm: Normal rate and regular rhythm.     Pulses: Normal pulses.     Heart sounds: Normal heart sounds. No murmur. No friction rub. No gallop.   Pulmonary:     Effort: Pulmonary effort is normal. No respiratory distress.     Breath sounds: Normal breath sounds. No wheezing, rhonchi or rales.  Chest:     Chest wall: No tenderness.  Abdominal:     General: Bowel sounds are normal. There is no distension.     Palpations: Abdomen is soft. There is no mass.     Tenderness: There is no abdominal tenderness. There is no right CVA tenderness, left CVA tenderness, guarding or rebound.  Skin:    General: Skin is warm and dry.     Coloration: Skin is not pale.     Findings: Erythema present. No bruising or rash.     Comments: Bilateral breast fold beefy skin redness right breast fold worst than the left.Bilateral groin fold skin beefy redness has improved compared to previous visit.   Neurological:     Mental Status: She is alert.     Labs reviewed: Recent Labs    08/22/18 0447  09/26/18 1016 12/07/18 0956 04/02/19 1219  NA 135 141 143 140  K 4.0 3.7 4.1 5.3  CL 99 102 104 103  CO2 28 24 32 29  GLUCOSE 270* 153* 116* 171*  BUN 22 22 16  31*  CREATININE 0.93 0.94 0.91 1.15*  CALCIUM 8.5* 9.8 9.6 9.8  PHOS 2.5  --   --   --    Recent Labs    08/09/18 1224 08/22/18 0447 12/07/18 0956 04/02/19 1219  AST 17  --  14 16  ALT 9  --  10 12  BILITOT 0.4  --  0.4 0.3  PROT 6.5  --  6.9 6.7   6.9  ALBUMIN  --  2.6*  --   --    Recent Labs    08/09/18 1224  09/26/18 1016 12/07/18 0956 04/02/19 1219  WBC 17.2*   < > 16.8* 12.9* 14.1*  NEUTROABS 12,264*  --   --  9,520* 10,321*  HGB 13.9   < > 13.9 14.0 13.5  HCT 42.9   < > 45.6 43.4 41.5  MCV 84.8   < > 85.6 80.8 83.3  PLT 430*   < > 387 339 343   < > = values in this interval not displayed.   Lab Results  Component Value Date   TSH 3.75 12/07/2018   Lab Results  Component Value Date   HGBA1C 7.4 (H) 12/07/2018   Lab Results  Component Value Date   CHOL 142 12/07/2018   HDL 38 (L) 12/07/2018   LDLCALC 83 12/07/2018   TRIG 118 12/07/2018   CHOLHDL 3.7 12/07/2018    Significant Diagnostic Results in last 30 days:  No results found.  Assessment/Plan   Candidal skin infection Bilateral breast fold beefy skin redness worst on the right breast fold.bilateral groin area skin redness has improved compared to previous visit. Continue on Nystatin powder.continues to decline wearing a bra to prevent skin to skin contact.Diflucan 150 mg tablet take one by mouth x 1 dose then repeat x 1 dose in one week.Has up coming appointment with Dr. Mariea Clonts in 1 week to re-evaluate.  Family/ staff Communication: Reviewed plan of care with patient   Labs/tests ordered: None  Next appt: Has up coming appointment  with PCP.   Sandrea Hughs, NP

## 2019-04-10 LAB — COMPLETE METABOLIC PANEL WITH GFR
AG Ratio: 1.4 (calc) (ref 1.0–2.5)
ALT: 12 U/L (ref 6–29)
AST: 13 U/L (ref 10–35)
Albumin: 3.6 g/dL (ref 3.6–5.1)
Alkaline phosphatase (APISO): 103 U/L (ref 37–153)
BUN: 19 mg/dL (ref 7–25)
CO2: 31 mmol/L (ref 20–32)
Calcium: 9.8 mg/dL (ref 8.6–10.4)
Chloride: 101 mmol/L (ref 98–110)
Creat: 0.88 mg/dL (ref 0.60–0.93)
GFR, Est African American: 73 mL/min/{1.73_m2} (ref >=60)
GFR, Est Non African American: 63 mL/min/{1.73_m2} (ref >=60)
Globulin: 2.6 g/dL (calc) (ref 1.9–3.7)
Glucose, Bld: 220 mg/dL — ABNORMAL HIGH (ref 65–99)
Potassium: 5.3 mmol/L (ref 3.5–5.3)
Sodium: 141 mmol/L (ref 135–146)
Total Bilirubin: 0.4 mg/dL (ref 0.2–1.2)
Total Protein: 6.2 g/dL (ref 6.1–8.1)

## 2019-04-10 LAB — CBC WITH DIFFERENTIAL/PLATELET
Absolute Monocytes: 969 cells/uL — ABNORMAL HIGH (ref 200–950)
Basophils Absolute: 144 cells/uL (ref 0–200)
Basophils Relative: 1.1 %
Eosinophils Absolute: 537 cells/uL — ABNORMAL HIGH (ref 15–500)
Eosinophils Relative: 4.1 %
HCT: 40.5 % (ref 35.0–45.0)
Hemoglobin: 13 g/dL (ref 11.7–15.5)
Lymphs Abs: 1965 cells/uL (ref 850–3900)
MCH: 27.4 pg (ref 27.0–33.0)
MCHC: 32.1 g/dL (ref 32.0–36.0)
MCV: 85.4 fL (ref 80.0–100.0)
MPV: 10.2 fL (ref 7.5–12.5)
Monocytes Relative: 7.4 %
Neutro Abs: 9484 cells/uL — ABNORMAL HIGH (ref 1500–7800)
Neutrophils Relative %: 72.4 %
Platelets: 351 10*3/uL (ref 140–400)
RBC: 4.74 10*6/uL (ref 3.80–5.10)
RDW: 13.1 % (ref 11.0–15.0)
Total Lymphocyte: 15 %
WBC: 13.1 10*3/uL — ABNORMAL HIGH (ref 3.8–10.8)

## 2019-04-10 LAB — HEMOGLOBIN A1C
Hgb A1c MFr Bld: 8.6 % of total Hgb — ABNORMAL HIGH (ref <5.7)
Mean Plasma Glucose: 200 (calc)
eAG (mmol/L): 11.1 (calc)

## 2019-04-11 ENCOUNTER — Other Ambulatory Visit: Payer: Self-pay | Admitting: Internal Medicine

## 2019-04-11 ENCOUNTER — Ambulatory Visit: Payer: Medicare Other | Admitting: Internal Medicine

## 2019-04-11 DIAGNOSIS — N3281 Overactive bladder: Secondary | ICD-10-CM

## 2019-04-16 ENCOUNTER — Telehealth: Payer: Self-pay | Admitting: *Deleted

## 2019-04-16 MED ORDER — FLUCONAZOLE 150 MG PO TABS: 150.0000 mg | ORAL_TABLET | Freq: Once | ORAL | 0 refills | Status: AC

## 2019-04-16 NOTE — Telephone Encounter (Signed)
Rx sent to pharmacy   

## 2019-04-16 NOTE — Telephone Encounter (Signed)
Assessment/Plan   Candidal skin infection Bilateral breast fold beefy skin redness worst on the right breast fold.bilateral groin area skin redness has improved compared to previous visit. Continue on Nystatin powder.continues to decline wearing a bra to prevent skin to skin contact.Diflucan 150 mg tablet take one by mouth x 1 dose then repeat x 1 dose in one week.Has up coming appointment with Dr. Mariea Clonts in 1 week to re-evaluate.     Patient called and stated that she was seen by Dinah on 04/09/19 and prescribed Diflucan. Stated that she took the first dose and it is time for the second dose but the pharmacy will not give her the medication. Stated that she needs [place] to send it to the pharmacy. Please Advise.

## 2019-04-16 NOTE — Telephone Encounter (Signed)
May send Diflucan 150 mg tablet x 1 dose.

## 2019-04-18 ENCOUNTER — Other Ambulatory Visit: Payer: Self-pay

## 2019-04-18 ENCOUNTER — Ambulatory Visit (INDEPENDENT_AMBULATORY_CARE_PROVIDER_SITE_OTHER): Payer: Medicare Other | Admitting: Internal Medicine

## 2019-04-18 ENCOUNTER — Encounter: Payer: Self-pay | Admitting: Internal Medicine

## 2019-04-18 VITALS — BP 140/70 | HR 93 | Temp 97.5°F | Ht 66.0 in | Wt 232.0 lb

## 2019-04-18 DIAGNOSIS — E1142 Type 2 diabetes mellitus with diabetic polyneuropathy: Secondary | ICD-10-CM

## 2019-04-18 DIAGNOSIS — Z794 Long term (current) use of insulin: Secondary | ICD-10-CM | POA: Diagnosis not present

## 2019-04-18 DIAGNOSIS — E785 Hyperlipidemia, unspecified: Secondary | ICD-10-CM

## 2019-04-18 DIAGNOSIS — Z23 Encounter for immunization: Secondary | ICD-10-CM | POA: Diagnosis not present

## 2019-04-18 DIAGNOSIS — B372 Candidiasis of skin and nail: Secondary | ICD-10-CM | POA: Diagnosis not present

## 2019-04-18 DIAGNOSIS — E1169 Type 2 diabetes mellitus with other specified complication: Secondary | ICD-10-CM

## 2019-04-18 DIAGNOSIS — E039 Hypothyroidism, unspecified: Secondary | ICD-10-CM

## 2019-04-18 DIAGNOSIS — N952 Postmenopausal atrophic vaginitis: Secondary | ICD-10-CM | POA: Diagnosis not present

## 2019-04-18 NOTE — Patient Instructions (Signed)
Go back on the estrace vaginal cream for atrophic vaginitis.

## 2019-04-18 NOTE — Progress Notes (Signed)
Location:  [facility] clinic Provider:  Lorenza Winkleman L. Mariea Clonts, D.O., C.M.D.  Code Status: DNR, MOST  Goals of Care:  Advanced Directives 01/11/2019  Does Patient Have a Medical Advance Directive? Yes  Type of Advance Directive Living will;Healthcare Power of Attorney  Does patient want to make changes to medical advance directive? -  Copy of [place] in Chart? -  Would patient like information on creating a medical advance directive? -  Pre-existing out of facility DNR order (yellow form or pink MOST form) -     Chief Complaint  Patient presents with  . Medical Management of Chronic Issues    20mth follow-up    HPI: Patient is a 78 y.o. female seen today for medical management of chronic diseases.    Creatinine elevated on Dr. Arlean Hopping labs.  Says she has had a UTI forever.  Last time was on bactrim for klebsiella.  Has an incontinence problem, of course, also, not just recurrent UTIs.  She has less frequency, but she still has pain, cringing when she has to go.  She did use vaginal cream for quite a while, then stopped.  She has a refill and will get that after we discussed the benefits.  Yeast under breasts and groin.  Uses cream and powder and puts soft cloths under the breasts.  Avoids bras due to irritation.  Same deal for the groin.  hba1c was up to 8.6.  Says she's cut out a lot of sugar.  Takes victoza, humalog 20 units with meals, toujeo 100 units at bedtime.   CBGs fasting 110-112, max after meals is 225 or 250.  Has to snack b/w to avoid lows and can get a 57, 79.    She sometimes self-adjusts her humalog and toujeo.  She did have a UTI during labs.    She's newly on leflunomide from 2 wks ago for her arthritis.  Will do mammogram and bone density coming up.    Also has eye exam coming up.    Feet still swell off and on right over left.  Does her prn lasix.  Past Medical History:  Diagnosis Date  . Abnormality of gait 04/19/2016  . Acute infective  polyneuritis ([place]) 2002  . Allergic rhinitis due to pollen   . Chronic pain syndrome   . COPD (chronic obstructive pulmonary disease) (HCC)    chronic bronchitis  . Depressive disorder, not elsewhere classified   . Diabetes mellitus without complication ([place])   . Diaphragmatic hernia without mention of obstruction or gangrene   . Dyslipidemia   . Fibromyalgia   . GERD (gastroesophageal reflux disease)    with h/o esophagitis  . Guillain-Barre syndrome ([place])   . History of benign thymus tumor   . Insomnia, unspecified   . Lumbar spinal stenosis 03/30/2018   L4-5 level, severe  . Miscarriage 1962  . Mixed hyperlipidemia   . Morbid obesity ([place])   . Osteoporosis   . Pneumonia   . Polyneuropathy in diabetes(357.2)   . Rheumatoid arthritis(714.0)   . Spondylosis, lumbosacral   . Spontaneous ecchymoses   . Type II or unspecified type diabetes mellitus with peripheral circulatory disorders, uncontrolled(250.72)   . Unspecified chronic bronchitis ([place])   . Unspecified essential hypertension   . Unspecified hypothyroidism   . Unspecified pruritic disorder   . Unspecified urinary incontinence     Past Surgical History:  Procedure Laterality Date  . ABDOMINAL HYSTERECTOMY  1974  . abdominal tumor  2002  .  APPENDECTOMY    . APPLICATION OF A-CELL OF BACK N/A 09/26/2018   Procedure: With Acell;  Surgeon: Wallace Going, DO;  Location: [facility];  Service: Plastics;  Laterality: N/A;  . APPLICATION OF WOUND VAC N/A 09/26/2018   Procedure: Vac placement;  Surgeon: Wallace Going, DO;  Location: [place];  Service: Plastics;  Laterality: N/A;  . [place]  . BRONCHOSCOPY  2001  . CHOLECYSTECTOMY  1984  . INCISION AND DRAINAGE OF WOUND N/A 09/26/2018   Procedure: Debridement of spine wound;  Surgeon: Wallace Going, DO;  Location: [place];  Service: Plastics;  Laterality: N/A;  . KNEE SURGERY Bilateral 08/27/2010 (L) and 01/11/2011 (R)  . LUMBAR LAMINECTOMY/DECOMPRESSION  MICRODISCECTOMY N/A 07/03/2018   Procedure: Lumbar Three to Lumbar Five Laminectomy;  Surgeon: Judith Part, MD;  Location: [facility];  Service: Neurosurgery;  Laterality: N/A;  . LUMBAR WOUND DEBRIDEMENT N/A 08/20/2018   Procedure: POSTERIOR LUMBAR SPINAL WOUND DEBRIDEMENT AND REVISION;  Surgeon: Judith Part, MD;  Location: [facility];  Service: Neurosurgery;  Laterality: N/A;  POSTERIOR LUMBAR SPINAL WOUND REVISION  . [place]  . OVARIAN CYST SURGERY  1968  . thymus tumor    . 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 TONSILLECTOMY      Allergies  Allergen Reactions  . Influenza Vaccines Other (See Comments)    "h/o Guillain Barre; dr's told me years ago never to take another flu shot as it could relapse Rosalee Kaufman; has to do with when vaccine being changed to H1N1 virus"  . Penicillins Hives, Itching, Swelling and Rash    Has patient had a PCN reaction causing immediate rash, facial/tongue/throat swelling, SOB or lightheadedness with hypotension:Unknown Has patient had a PCN reaction causing severe rash involving mucus membranes or skin necrosis: Unknown Has patient had a PCN reaction that required hospitalization:Unknown Has patient had a PCN reaction occurring within the last 10 years: Unknown If all of the above answers are "NO", then may proceed with Cephalosporin use.   . Statins Other (See Comments)    Myopathy, transaminitis  . Sulfamethoxazole-Trimethoprim Itching    Outpatient Encounter Medications as of 04/18/2019  Medication Sig  . acetaminophen (TYLENOL) 325 MG tablet Take 650 mg by mouth every 6 (six) hours as needed (for pain.).  Marland Kitchen albuterol (PROVENTIL HFA;VENTOLIN HFA) 108 (90 Base) MCG/ACT inhaler Inhale 2 puffs into the lungs every 6 (six) hours as needed for wheezing or shortness of breath.  Marland Kitchen amLODipine (NORVASC) 10 MG tablet Take 1 tablet (10 mg total) by mouth daily. Please schedule appointment for refills.  Marland Kitchen aspirin EC 81 MG tablet Take 81 mg by mouth 2 (two)  times a week.  . B-D UF III MINI PEN NEEDLES 31G X 5 MM MISC USE AS DIRECTED  . Cranberry 475 MG CAPS Take 1 capsule (475 mg total) by mouth 2 (two) times daily.  Marland Kitchen docusate sodium (COLACE) 100 MG capsule Take 100 mg by mouth daily as needed (constipation.).   Marland Kitchen estradiol (ESTRACE) 0.1 MG/GM vaginal cream Place 1 Applicatorful vaginally 3 (three) times a week.  . folic acid (FOLVITE) 1 MG tablet Take 2 tablets (2 mg total) by mouth daily.  . furosemide (LASIX) 20 MG tablet TAKE 1 TABLET BY MOUTH TWICE DAILY AS NEEDED FOR 3 POUND WEIGHT GAIN IN ONE DAY OR 5 POUNDS IN ONE WEEK  . gabapentin (NEURONTIN) 600 MG tablet TAKE 1 TABLET BY MOUTH THREE TIMES DAILY  . HUMALOG KWIKPEN 200  UNIT/ML SOPN INJECT 20 UNITS UNDER THE SKIN THREE TIMES DAILY BEFORE A MEAL  . leflunomide (ARAVA) 10 MG tablet Take 1 tablet (10 mg total) by mouth daily.  Marland Kitchen levothyroxine (SYNTHROID) 137 MCG tablet TAKE 1 TABLET BY MOUTH DAILY BEFORE BREAKFAST ON AN EMPTY STOMACH( LABS OVERDUE)  . lisinopril (PRINIVIL,ZESTRIL) 20 MG tablet Take 1 tablet (20 mg total) by mouth daily.  Marland Kitchen loperamide (IMODIUM A-D) 2 MG tablet Take 2 mg by mouth 4 (four) times daily as needed for diarrhea or loose stools.  . magnesium oxide (MAG-OX) 400 MG tablet Take 400 mg by mouth daily as needed (for leg cramps).   . Melatonin 10 MG TABS Take 10 mg by mouth at bedtime.  . metoprolol tartrate (LOPRESSOR) 50 MG tablet TAKE 1 TABLET(50 MG) BY MOUTH TWICE DAILY  . MYRBETRIQ 50 MG TB24 tablet TAKE 1 TABLET(50 MG) BY MOUTH DAILY  . nystatin (NYSTATIN) powder APPLY TOPICALLY THREE TIMES DAILY AS DIRECTED FOR 14 DAYS  . nystatin cream (MYCOSTATIN) Apply 1 application topically 2 (two) times daily. To skin folds  . omega-3 acid ethyl esters (LOVAZA) 1 g capsule TAKE ONE CAPSULE BY MOUTH EVERY DAY  . oxyCODONE (ROXICODONE) 5 MG immediate release tablet Take 1 tablet (5 mg total) by mouth every 6 (six) hours as needed for severe pain.  . polyvinyl alcohol (LIQUIFILM  TEARS) 1.4 % ophthalmic solution Place 1 drop into both eyes 4 (four) times daily as needed (for dry/irritated eyes).   . potassium chloride (MICRO-K) 10 MEQ CR capsule Take 2 capsules (20 mEq total) by mouth 2 (two) times daily as needed (when taking a dose of lasix.).  Marland Kitchen tiZANidine (ZANAFLEX) 2 MG tablet TAKE 1 TABLET(2 MG) BY MOUTH THREE TIMES DAILY  . TOUJEO SOLOSTAR 300 UNIT/ML SOPN INJECT 100 UNITS INTO THE SKIN EVERY NIGHT AT BEDTIME  . triamterene-hydrochlorothiazide (MAXZIDE-25) 37.5-25 MG tablet Take 1 tablet by mouth daily.  Marland Kitchen umeclidinium-vilanterol (ANORO ELLIPTA) 62.5-25 MCG/INH AEPB Inhale 1 puff into the lungs daily.  Marland Kitchen venlafaxine XR (EFFEXOR-XR) 75 MG 24 hr capsule TAKE 1 CAPSULE BY MOUTH EVERY DAY WITH BREAKFAST  . VICTOZA 18 MG/3ML SOPN ADMINISTER 1.8 MG UNDER THE SKIN DAILY FOR BLOOD SUGAR   No facility-administered encounter medications on file as of 04/18/2019.     Review of Systems:  Review of Systems  Constitutional: Positive for malaise/fatigue. Negative for chills and fever.  HENT: Negative for congestion.   Eyes: Negative for blurred vision.  Respiratory: Negative for cough and shortness of breath.   Cardiovascular: Positive for leg swelling. Negative for chest pain and palpitations.  Gastrointestinal: Negative for abdominal pain, blood in stool, constipation, diarrhea and melena.  Genitourinary: Positive for dysuria and urgency. Negative for flank pain, frequency and hematuria.  Musculoskeletal: Positive for back pain and joint pain. Negative for falls.  Skin: Positive for rash. Negative for itching.  Neurological: Positive for tingling and sensory change. Negative for dizziness and loss of consciousness.  Endo/Heme/Allergies: Bruises/bleeds easily.  Psychiatric/Behavioral: Negative for depression and memory loss. The patient is not nervous/anxious and does not have insomnia.     Health Maintenance  Topic Date Due  . OPHTHALMOLOGY EXAM  12/20/1950  . FOOT  EXAM  09/27/2016  . INFLUENZA VACCINE  03/23/2019  . HEMOGLOBIN A1C  10/10/2019  . DEXA SCAN  09/20/2025  . TETANUS/TDAP  05/28/2026  . PNA vac Low Risk Adult  Completed    Physical Exam: Vitals:   04/18/19 1010  BP: 140/70  Pulse:  93  Temp: (!) 97.5 F (36.4 C)  TempSrc: Oral  SpO2: 95%  Weight: 232 lb (105.2 kg)  Height: 5\' 6"  (1.676 m)   Body mass index is 37.45 kg/m. Physical Exam Vitals signs reviewed.  Constitutional:      General: She is not in acute distress.    Appearance: She is obese. She is not toxic-appearing.  HENT:     Head: Normocephalic and atraumatic.  Cardiovascular:     Rate and Rhythm: Normal rate. Rhythm irregular.  Pulmonary:     Effort: Pulmonary effort is normal.     Breath sounds: Normal breath sounds.  Abdominal:     General: Bowel sounds are normal.  Musculoskeletal:     Right lower leg: Edema present.     Left lower leg: Edema present.     Comments: Came in in manual wheelchair  Skin:    General: Skin is warm.     Capillary Refill: Capillary refill takes less than 2 seconds.     Comments: Erythema beneath both breasts  Neurological:     General: No focal deficit present.     Mental Status: She is alert and oriented to person, place, and time.  Psychiatric:        Mood and Affect: Mood normal.        Behavior: Behavior normal.        Thought Content: Thought content normal.     Labs reviewed: Basic Metabolic Panel: Recent Labs    08/09/18 1224  08/22/18 0447  12/07/18 0956 04/02/19 1219 04/09/19 0820  NA 142   < > 135   < > 143 140 141  K 4.1   < > 4.0   < > 4.1 5.3 5.3  CL 104   < > 99   < > 104 103 101  CO2 30   < > 28   < > 32 29 31  GLUCOSE 45*   < > 270*   < > 116* 171* 220*  BUN 19   < > 22   < > 16 31* 19  CREATININE 1.00*   < > 0.93   < > 0.91 1.15* 0.88  CALCIUM 9.8   < > 8.5*   < > 9.6 9.8 9.8  PHOS  --   --  2.5  --   --   --   --   TSH 0.84  --   --   --  3.75  --   --    < > = values in this interval not  displayed.   Liver Function Tests: Recent Labs    08/22/18 0447 12/07/18 0956 04/02/19 1219 04/09/19 0820  AST  --  14 16 13   ALT  --  10 12 12   BILITOT  --  0.4 0.3 0.4  PROT  --  6.9 6.7  6.9 6.2  ALBUMIN 2.6*  --   --   --    No results for input(s): LIPASE, AMYLASE in the last 8760 hours. No results for input(s): AMMONIA in the last 8760 hours. CBC: Recent Labs    12/07/18 0956 04/02/19 1219 04/09/19 0820  WBC 12.9* 14.1* 13.1*  NEUTROABS 9,520* 10,321* 9,484*  HGB 14.0 13.5 13.0  HCT 43.4 41.5 40.5  MCV 80.8 83.3 85.4  PLT 339 343 351   Lipid Panel: Recent Labs    08/09/18 1224 12/07/18 0956  CHOL 159 142  HDL 30* 38*  LDLCALC 97 83  TRIG 219* 118  CHOLHDL  5.3* 3.7   Lab Results  Component Value Date   HGBA1C 8.6 (H) 04/09/2019    Assessment/Plan 1. Atrophic vaginitis -continue vaginal estrogen therapy which should help decrease UTIs for her if she uses it consistently  2. Candidiasis of skin -continue nystatin powder, also had course of diflucan she says  3. Type 2 diabetes mellitus with diabetic polyneuropathy, with long-term current use of insulin (HCC) -hba1c trended up some but did have UTI in midst of the 3 mos -has lows at times so I did not want to increase insulin, reports regular meals and even having to snack some b/w--did not have meter along - Hemoglobin A1c; Future - CBC with Differential/Platelet; Future - Basic metabolic panel; Future  4. Acquired hypothyroidism -last tsh wnl -cont current levothyroxine   5. Hyperlipidemia associated with type 2 diabetes mellitus ([place]) -not on statin due to both myopathy and transaminitis - Lipid panel; Future  6. Need for influenza vaccination -she is thinking about whether she wants to take this, says she had guillain-barre from the one for H1N1  Labs/tests ordered:   Lab Orders     Hemoglobin A1c     CBC with Differential/Platelet     Basic metabolic panel     Lipid panel  Next  appt:  08/19/2019    Nickalaus Crooke L. Telecia Larocque, D.O. [facility] [address], [place] [address] Cell Phone (Mon-Fri 8am-5pm):  [phone] On Call:  [phone] & follow prompts after 5pm & weekends Office Phone:  [phone] Office Fax:  [phone]

## 2019-04-22 ENCOUNTER — Other Ambulatory Visit: Payer: Self-pay | Admitting: *Deleted

## 2019-04-22 ENCOUNTER — Other Ambulatory Visit: Payer: Self-pay | Admitting: Internal Medicine

## 2019-04-22 DIAGNOSIS — M5441 Lumbago with sciatica, right side: Secondary | ICD-10-CM

## 2019-04-22 DIAGNOSIS — E1151 Type 2 diabetes mellitus with diabetic peripheral angiopathy without gangrene: Secondary | ICD-10-CM

## 2019-04-22 DIAGNOSIS — G8929 Other chronic pain: Secondary | ICD-10-CM

## 2019-04-22 MED ORDER — OXYCODONE HCL 5 MG PO TABS: 5.0000 mg | ORAL_TABLET | Freq: Four times a day (QID) | ORAL | 0 refills | Status: DC | PRN

## 2019-04-22 NOTE — Telephone Encounter (Signed)
Patient requested refill [place] Database Verified LR: 03/19/2019 Pended Rx and sent to Dr. Mariea Clonts for approval.

## 2019-04-25 DIAGNOSIS — M810 Age-related osteoporosis without current pathological fracture: Secondary | ICD-10-CM | POA: Diagnosis not present

## 2019-04-25 DIAGNOSIS — Z96653 Presence of artificial knee joint, bilateral: Secondary | ICD-10-CM | POA: Diagnosis not present

## 2019-04-25 DIAGNOSIS — M8589 Other specified disorders of bone density and structure, multiple sites: Secondary | ICD-10-CM | POA: Diagnosis not present

## 2019-04-25 DIAGNOSIS — N951 Menopausal and female climacteric states: Secondary | ICD-10-CM | POA: Diagnosis not present

## 2019-04-25 DIAGNOSIS — Z1231 Encounter for screening mammogram for malignant neoplasm of breast: Secondary | ICD-10-CM | POA: Diagnosis not present

## 2019-04-25 DIAGNOSIS — Z90710 Acquired absence of both cervix and uterus: Secondary | ICD-10-CM | POA: Diagnosis not present

## 2019-05-17 ENCOUNTER — Telehealth: Payer: Self-pay | Admitting: *Deleted

## 2019-05-17 NOTE — Telephone Encounter (Signed)
Received DEXA results from [facility]   Date of Scan: 04/25/19 Lowest T-score and site measured:-2.4 Left Femur Neck Significant changes in BMD and site measured (5% and above): 22%  0.481  Current Regimen: Not currently on any treatment  Recommendation:Repeat Dexa in 2 years.  Patient advised of results and recommendations.

## 2019-05-20 ENCOUNTER — Telehealth: Payer: Self-pay | Admitting: Rheumatology

## 2019-05-20 ENCOUNTER — Other Ambulatory Visit: Payer: Self-pay | Admitting: *Deleted

## 2019-05-20 DIAGNOSIS — G8929 Other chronic pain: Secondary | ICD-10-CM

## 2019-05-20 MED ORDER — OXYCODONE HCL 5 MG PO TABS: 5.0000 mg | ORAL_TABLET | Freq: Four times a day (QID) | ORAL | 0 refills | Status: DC | PRN

## 2019-05-20 NOTE — Telephone Encounter (Signed)
Patient calling to discuss Leflutimide. Patient having trouble taking this. Please call to discuss other options.

## 2019-05-20 NOTE — Telephone Encounter (Signed)
Patient requested refill  NCCSRS Database Verified LR: 04/22/2019 Pended Rx and sent to [facility] for approval. (Dr. Mariea Clonts out of office)

## 2019-05-20 NOTE — Telephone Encounter (Signed)
Attempted to contact the patient and left message for patient to call the office.  

## 2019-05-21 ENCOUNTER — Telehealth: Payer: Self-pay | Admitting: *Deleted

## 2019-05-21 ENCOUNTER — Encounter: Payer: Self-pay | Admitting: Physical Medicine and Rehabilitation

## 2019-05-21 NOTE — Telephone Encounter (Signed)
Patient called wanting medication for UTI and yeast infection. She stated that she has being having this problem for months ans was given something before to help with the discomfort. She states that she can do a tele-visit if needed [phone])

## 2019-05-21 NOTE — Telephone Encounter (Signed)
Will need visit at the office to provide urine specimen.

## 2019-05-22 ENCOUNTER — Ambulatory Visit (INDEPENDENT_AMBULATORY_CARE_PROVIDER_SITE_OTHER): Payer: Medicare Other | Admitting: Family

## 2019-05-22 ENCOUNTER — Other Ambulatory Visit: Payer: Self-pay

## 2019-05-22 ENCOUNTER — Encounter: Payer: Self-pay | Admitting: Family

## 2019-05-22 VITALS — BP 118/68 | HR 70 | Temp 98.6°F | Ht 66.0 in | Wt 229.6 lb

## 2019-05-22 DIAGNOSIS — B373 Candidiasis of vulva and vagina: Secondary | ICD-10-CM

## 2019-05-22 DIAGNOSIS — B372 Candidiasis of skin and nail: Secondary | ICD-10-CM

## 2019-05-22 DIAGNOSIS — R350 Frequency of micturition: Secondary | ICD-10-CM | POA: Diagnosis not present

## 2019-05-22 DIAGNOSIS — B3731 Acute candidiasis of vulva and vagina: Secondary | ICD-10-CM

## 2019-05-22 MED ORDER — FOLIC ACID 1 MG PO TABS: 2.0000 mg | ORAL_TABLET | Freq: Every day | ORAL | 0 refills | Status: DC

## 2019-05-22 MED ORDER — AMLODIPINE BESYLATE 10 MG PO TABS: 10.0000 mg | ORAL_TABLET | Freq: Every day | ORAL | 0 refills | Status: DC

## 2019-05-22 MED ORDER — FLUCONAZOLE 150 MG PO TABS: ORAL_TABLET | ORAL | 0 refills | Status: DC

## 2019-05-22 MED ORDER — MAGNESIUM OXIDE 400 MG PO TABS: 400.0000 mg | ORAL_TABLET | Freq: Every day | ORAL | 0 refills | Status: DC | PRN

## 2019-05-22 MED ORDER — CLOTRIMAZOLE-BETAMETHASONE 1-0.05 % EX CREA: 1.0000 "application " | TOPICAL_CREAM | Freq: Two times a day (BID) | CUTANEOUS | 0 refills | Status: AC

## 2019-05-22 NOTE — Progress Notes (Addendum)
Provider: Dinah Ngetich FNP-C  Gayland Curry, DO  Patient Care Team: Gayland Curry, DO as PCP - General (Geriatric Medicine) Bo Merino, MD (Rheumatology) Altheimer, Legrand Como, MD as Attending Physician (Endocrinology) Newt Minion, MD as Consulting Physician (Orthopedic Surgery)  Extended Emergency Contact Information Primary Emergency Contact: Marlana Latus, [place] Phone: [phone] Relation: Son Secondary Emergency Contact: Jonnie Finner Address: [address]          [place], [place] [address] Johnnette Litter of [place] Phone: [phone] Relation: Son  Code Status:  DNR Goals of care: Advanced Directive information Advanced Directives 01/11/2019  Does Patient Have a Medical Advance Directive? Yes  Type of Advance Directive Living will;Healthcare Power of Attorney  Does patient want to make changes to medical advance directive? -  Copy of [place] in Chart? -  Would patient like information on creating a medical advance directive? -  Pre-existing out of facility DNR order (yellow form or pink MOST form) -     Chief Complaint  Patient presents with  . Acute Visit    Feels she has a UTI and vaginal yeast infection, complains of urinary incontinence  . Quality Metric Gaps    Needs ophthalmology exam, states she will schedule for next month    HPI:  Pt is a 78 y.o. female seen today for an acute visit for evaluation of possible UTI and vaginal yeast infection.she complains of increased frequency urinary incontinence and burning with urination.she has also noticed whitish vaginal discharge that makes the urine cloudy.previous skin redness has improved but not resolved on bilateral breast folds,right groin area and has new areas on vulva and gluteal folds. She states itching on this red areas.Previous diflucan helped but symptoms have returned.she wears incontinent pull up and adds an  ABD pad so that she does not have to change frequent. CBG 150's-160's and 200's after meals sometimes.she denies any fever or chills.    Past Medical History:  Diagnosis Date  . Abnormality of gait 04/19/2016  . Acute infective polyneuritis ([place]) 2002  . Allergic rhinitis due to pollen   . Chronic pain syndrome   . COPD (chronic obstructive pulmonary disease) (HCC)    chronic bronchitis  . Depressive disorder, not elsewhere classified   . Diabetes mellitus without complication ([place])   . Diaphragmatic hernia without mention of obstruction or gangrene   . Dyslipidemia   . Fibromyalgia   . GERD (gastroesophageal reflux disease)    with h/o esophagitis  . Guillain-Barre syndrome ([place])   . History of benign thymus tumor   . Insomnia, unspecified   . Lumbar spinal stenosis 03/30/2018   L4-5 level, severe  . Miscarriage 1962  . Mixed hyperlipidemia   . Morbid obesity ([place])   . Osteoporosis   . Pneumonia   . Polyneuropathy in diabetes(357.2)   . Rheumatoid arthritis(714.0)   . Spondylosis, lumbosacral   . Spontaneous ecchymoses   . Type II or unspecified type diabetes mellitus with peripheral circulatory disorders, uncontrolled(250.72)   . Unspecified chronic bronchitis ([facility])   . Unspecified essential hypertension   . Unspecified hypothyroidism   . Unspecified pruritic disorder   . Unspecified urinary incontinence    Past Surgical History:  Procedure Laterality Date  . ABDOMINAL HYSTERECTOMY  1974  . abdominal tumor  2002  . APPENDECTOMY    . APPLICATION OF A-CELL OF BACK N/A 09/26/2018   Procedure: With [place];  Surgeon:  Dillingham, Loel Lofty, DO;  Location: [facility];  Service: Plastics;  Laterality: N/A;  . APPLICATION OF WOUND VAC N/A 09/26/2018   Procedure: Vac placement;  Surgeon: Wallace Going, DO;  Location: [facility];  Service: Plastics;  Laterality: N/A;  . [place]  . BRONCHOSCOPY  2001  . CHOLECYSTECTOMY  1984  . INCISION AND DRAINAGE OF WOUND N/A 09/26/2018    Procedure: Debridement of spine wound;  Surgeon: Wallace Going, DO;  Location: [facility];  Service: Plastics;  Laterality: N/A;  . KNEE SURGERY Bilateral 08/27/2010 (L) and 01/11/2011 (R)  . LUMBAR LAMINECTOMY/DECOMPRESSION MICRODISCECTOMY N/A 07/03/2018   Procedure: Lumbar Three to Lumbar Five Laminectomy;  Surgeon: Judith Part, MD;  Location: [facility];  Service: Neurosurgery;  Laterality: N/A;  . LUMBAR WOUND DEBRIDEMENT N/A 08/20/2018   Procedure: POSTERIOR LUMBAR SPINAL WOUND DEBRIDEMENT AND REVISION;  Surgeon: Judith Part, MD;  Location: [facility];  Service: Neurosurgery;  Laterality: N/A;  POSTERIOR LUMBAR SPINAL WOUND REVISION  . [place]  . OVARIAN CYST SURGERY  1968  . thymus tumor    . 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 TONSILLECTOMY      Allergies  Allergen Reactions  . Influenza Vaccines Other (See Comments)    "h/o Guillain Barre; dr's told me years ago never to take another flu shot as it could relapse Rosalee Kaufman; has to do with when vaccine being changed to H1N1 virus"  . Penicillins Hives, Itching, Swelling and Rash    Has patient had a PCN reaction causing immediate rash, facial/tongue/throat swelling, SOB or lightheadedness with hypotension:Unknown Has patient had a PCN reaction causing severe rash involving mucus membranes or skin necrosis: Unknown Has patient had a PCN reaction that required hospitalization:Unknown Has patient had a PCN reaction occurring within the last 10 years: Unknown If all of the above answers are "NO", then may proceed with Cephalosporin use.   . Statins Other (See Comments)    Myopathy, transaminitis  . Sulfamethoxazole-Trimethoprim Itching    Outpatient Encounter Medications as of 05/22/2019  Medication Sig  . acetaminophen (TYLENOL) 325 MG tablet Take 650 mg by mouth every 6 (six) hours as needed (for pain.).  Marland Kitchen albuterol (PROVENTIL HFA;VENTOLIN HFA) 108 (90 Base) MCG/ACT inhaler Inhale 2 puffs into the lungs every 6 (six)  hours as needed for wheezing or shortness of breath.  Marland Kitchen amLODipine (NORVASC) 10 MG tablet Take 1 tablet (10 mg total) by mouth daily. Please schedule appointment for refills.  Marland Kitchen aspirin EC 81 MG tablet Take 81 mg by mouth 2 (two) times a week.  . B-D UF III MINI PEN NEEDLES 31G X 5 MM MISC USE AS DIRECTED  . docusate sodium (COLACE) 100 MG capsule Take 100 mg by mouth daily as needed (constipation.).   Marland Kitchen estradiol (ESTRACE) 0.1 MG/GM vaginal cream Place 1 Applicatorful vaginally 3 (three) times a week.  . folic acid (FOLVITE) 1 MG tablet Take 2 tablets (2 mg total) by mouth daily.  . furosemide (LASIX) 20 MG tablet TAKE 1 TABLET BY MOUTH TWICE DAILY AS NEEDED FOR 3 POUND WEIGHT GAIN IN ONE DAY OR 5 POUNDS IN ONE WEEK  . gabapentin (NEURONTIN) 600 MG tablet TAKE 1 TABLET BY MOUTH THREE TIMES DAILY  . HUMALOG KWIKPEN 200 UNIT/ML SOPN INJECT 20 UNITS UNDER THE SKIN THREE TIMES DAILY BEFORE A MEAL  . leflunomide (ARAVA) 10 MG tablet Take 1 tablet (10 mg total) by mouth daily.  Marland Kitchen levothyroxine (SYNTHROID) 137 MCG tablet  TAKE 1 TABLET BY MOUTH BEFORE BREAKFAST ON AN EMPTY STOMACH  . lisinopril (ZESTRIL) 20 MG tablet TAKE 1 TABLET(20 MG) BY MOUTH DAILY  . loperamide (IMODIUM A-D) 2 MG tablet Take 2 mg by mouth 4 (four) times daily as needed for diarrhea or loose stools.  . magnesium oxide (MAG-OX) 400 MG tablet Take 1 tablet (400 mg total) by mouth daily as needed (for leg cramps).  . Melatonin 10 MG TABS Take 10 mg by mouth at bedtime.  . metoprolol tartrate (LOPRESSOR) 50 MG tablet TAKE 1 TABLET(50 MG) BY MOUTH TWICE DAILY  . MYRBETRIQ 50 MG TB24 tablet TAKE 1 TABLET(50 MG) BY MOUTH DAILY  . nystatin (NYSTATIN) powder APPLY TOPICALLY THREE TIMES DAILY AS DIRECTED FOR 14 DAYS  . nystatin cream (MYCOSTATIN) Apply 1 application topically 2 (two) times daily. To skin folds  . omega-3 acid ethyl esters (LOVAZA) 1 g capsule TAKE ONE CAPSULE BY MOUTH EVERY DAY  . oxyCODONE (ROXICODONE) 5 MG immediate  release tablet Take 1 tablet (5 mg total) by mouth every 6 (six) hours as needed for severe pain.  . polyvinyl alcohol (LIQUIFILM TEARS) 1.4 % ophthalmic solution Place 1 drop into both eyes 4 (four) times daily as needed (for dry/irritated eyes).   . potassium chloride (MICRO-K) 10 MEQ CR capsule Take 2 capsules (20 mEq total) by mouth 2 (two) times daily as needed (when taking a dose of lasix.).  Marland Kitchen tiZANidine (ZANAFLEX) 2 MG tablet TAKE 1 TABLET(2 MG) BY MOUTH THREE TIMES DAILY  . TOUJEO SOLOSTAR 300 UNIT/ML SOPN INJECT 100 UNITS INTO THE SKIN EVERY NIGHT AT BEDTIME  . triamterene-hydrochlorothiazide (MAXZIDE-25) 37.5-25 MG tablet Take 1 tablet by mouth daily.  Marland Kitchen umeclidinium-vilanterol (ANORO ELLIPTA) 62.5-25 MCG/INH AEPB Inhale 1 puff into the lungs daily.  Marland Kitchen venlafaxine XR (EFFEXOR-XR) 75 MG 24 hr capsule TAKE 1 CAPSULE BY MOUTH EVERY DAY WITH BREAKFAST  . VICTOZA 18 MG/3ML SOPN ADMINISTER 1.8 MG UNDER THE SKIN DAILY FOR BLOOD SUGAR  . [DISCONTINUED] amLODipine (NORVASC) 10 MG tablet Take 1 tablet (10 mg total) by mouth daily. Please schedule appointment for refills.  . [DISCONTINUED] folic acid (FOLVITE) 1 MG tablet Take 2 tablets (2 mg total) by mouth daily.  . [DISCONTINUED] magnesium oxide (MAG-OX) 400 MG tablet Take 400 mg by mouth daily as needed (for leg cramps).   . clotrimazole-betamethasone (LOTRISONE) cream Apply 1 application topically 2 (two) times daily for 14 days. Apply topically to affected areas on vulva area.  . fluconazole (DIFLUCAN) 150 MG tablet Take one tablet by mouth every 72 hours x 3 doses then Take one by mouth once a week x 6 months.   No facility-administered encounter medications on file as of 05/22/2019.     Review of Systems  Constitutional: Negative for appetite change, chills, fatigue and fever.  HENT: Negative for congestion, rhinorrhea, sinus pressure, sinus pain, sneezing and sore throat.   Respiratory: Negative for cough, chest tightness, shortness of  breath and wheezing.   Cardiovascular: Negative for chest pain.  Gastrointestinal: Negative for abdominal distention, abdominal pain, constipation, diarrhea, nausea and vomiting.  Endocrine: Negative for polydipsia, polyphagia and polyuria.  Genitourinary: Positive for dysuria, frequency and vaginal discharge. Negative for difficulty urinating, flank pain, urgency and vaginal bleeding.       Vaginal itching   Musculoskeletal: Positive for gait problem.  Skin: Negative for color change, pallor and rash.  Neurological: Negative for dizziness, light-headedness and headaches.  Psychiatric/Behavioral: Negative for agitation, confusion and sleep disturbance. The patient  is not nervous/anxious.     Immunization History  Administered Date(s) Administered  . Influenza-Unspecified 06/10/2011  . Pneumococcal Conjugate-13 07/07/2016  . Pneumococcal Polysaccharide-23 11/21/2003, 10/05/2017  . Tdap 05/28/2016   Pertinent  Health Maintenance Due  Topic Date Due  . OPHTHALMOLOGY EXAM  12/20/1950  . HEMOGLOBIN A1C  10/10/2019  . FOOT EXAM  04/17/2020  . DEXA SCAN  09/20/2025  . PNA vac Low Risk Adult  Completed  . INFLUENZA VACCINE  Discontinued   Fall Risk  04/18/2019 04/09/2019 03/22/2019 02/05/2019 01/11/2019  Falls in the past year? 0 0 0 0 0  Number falls in past yr: 0 0 0 0 0  Injury with Fall? 0 0 0 0 0  Risk for fall due to : - - - - -  Follow up - - - - -    Vitals:   05/22/19 1450  BP: 118/68  Pulse: 70  Temp: 98.6 F (37 C)  TempSrc: Oral  SpO2: 95%  Weight: 229 lb 9.6 oz (104.1 kg)  Height: 5\' 6"  (1.676 m)   Body mass index is 37.06 kg/m. Physical Exam Constitutional:      General: She is not in acute distress.    Appearance: She is obese. She is not ill-appearing.  HENT:     Mouth/Throat:     Mouth: Mucous membranes are moist.     Pharynx: Oropharynx is clear. No oropharyngeal exudate or posterior oropharyngeal erythema.  Cardiovascular:     Rate and Rhythm: Normal  rate and regular rhythm.     Pulses: Normal pulses.     Heart sounds: Normal heart sounds. No murmur. No friction rub. No gallop.   Pulmonary:     Effort: Pulmonary effort is normal. No respiratory distress.     Breath sounds: Normal breath sounds. No wheezing, rhonchi or rales.  Chest:     Chest wall: No tenderness.  Abdominal:     General: Bowel sounds are normal. There is no distension.     Palpations: Abdomen is soft. There is no mass.     Tenderness: There is no abdominal tenderness. There is no right CVA tenderness, left CVA tenderness, guarding or rebound.  Genitourinary:    Labia:        Right: No tenderness.        Left: No tenderness.      Vagina: Vaginal discharge present.     Comments: No vaginal discharged noted.vulva beefy skin redness noted.   Musculoskeletal:        General: No swelling or tenderness.     Right lower leg: No edema.     Left lower leg: No edema.     Comments: Unsteady gait   Lymphadenopathy:     Lower Body: No right inguinal adenopathy. No left inguinal adenopathy.  Skin:    General: Skin is warm and dry.     Coloration: Skin is not pale.     Findings: Erythema present. No bruising or rash.     Comments: Right breast fold previous Beefy skin redness has improved but has scratch marks.right abdominal fold beefy skin redness and new areas to vulva and gluteal fold noted.  Neurological:     Mental Status: She is alert and oriented to person, place, and time.     Cranial Nerves: No cranial nerve deficit.     Sensory: No sensory deficit.     Motor: No weakness.     Coordination: Coordination normal.     Gait: Gait abnormal.  Psychiatric:        Mood and Affect: Mood normal.        Behavior: Behavior normal.        Thought Content: Thought content normal.        Judgment: Judgment normal.    Labs reviewed: Recent Labs    08/22/18 0447  12/07/18 0956 04/02/19 1219 04/09/19 0820  NA 135   < > 143 140 141  K 4.0   < > 4.1 5.3 5.3  CL 99   <  > 104 103 101  CO2 28   < > 32 29 31  GLUCOSE 270*   < > 116* 171* 220*  BUN 22   < > 16 31* 19  CREATININE 0.93   < > 0.91 1.15* 0.88  CALCIUM 8.5*   < > 9.6 9.8 9.8  PHOS 2.5  --   --   --   --    < > = values in this interval not displayed.   Recent Labs    08/22/18 0447 12/07/18 0956 04/02/19 1219 04/09/19 0820  AST  --  14 16 13   ALT  --  10 12 12   BILITOT  --  0.4 0.3 0.4  PROT  --  6.9 6.7  6.9 6.2  ALBUMIN 2.6*  --   --   --    Recent Labs    12/07/18 0956 04/02/19 1219 04/09/19 0820  WBC 12.9* 14.1* 13.1*  NEUTROABS 9,520* 10,321* 9,484*  HGB 14.0 13.5 13.0  HCT 43.4 41.5 40.5  MCV 80.8 83.3 85.4  PLT 339 343 351   Lab Results  Component Value Date   TSH 3.75 12/07/2018   Lab Results  Component Value Date   HGBA1C 8.6 (H) 04/09/2019   Lab Results  Component Value Date   CHOL 142 12/07/2018   HDL 38 (L) 12/07/2018   LDLCALC 83 12/07/2018   TRIG 118 12/07/2018   CHOLHDL 3.7 12/07/2018    Significant Diagnostic Results in last 30 days:  No results found.  Assessment/Plan 1. Urinary frequency Afebrile.will send urine for culture.  - POC Urinalysis Dipstick indicates large leukocytes,+1 Nitrites ,Negative protein and blood,trace ketones and moderate bilirubin  - Urine Culture  2. Candidiasis of skin Recurrent complicated with her Type 2 DM,obesity and incontinence.  Bilateral breast fold beefy skin redness worst on the right.right groin and new areas to vulva and gluteal fold. - fluconazole (DIFLUCAN) 150 MG tablet; Take one tablet by mouth every 72 hours x 3 doses then Take one by mouth once a week x 6 months.  Dispense: 30 tablet; Refill: 0  3. Vulvovaginitis candida albicans Recurrent complicated with her Type 2 DM, obesity and incontinence.  - fluconazole (DIFLUCAN) 150 MG tablet; Take one tablet by mouth every 72 hours x 3 doses then Take one by mouth once a week x 6 months.  Dispense: 30 tablet; Refill: 0 - clotrimazole-betamethasone  (LOTRISONE) cream; Apply 1 application topically 2 (two) times daily for 14 days. Apply topically to affected areas on vulva area.  Dispense: 30 g; Refill: 0  Family/ staff Communication: Reviewed plan of care with patient.   Labs/tests ordered:  - POC Urinalysis Dipstick - Urine Culture  Nelda Bucks Ngetich, NP

## 2019-05-22 NOTE — Telephone Encounter (Signed)
Patient was able to schedule an appointment with Marlowe Sax NP on 05/22/2019.

## 2019-05-24 LAB — URINE CULTURE
MICRO NUMBER:: 940223
SPECIMEN QUALITY:: ADEQUATE

## 2019-05-27 ENCOUNTER — Telehealth: Payer: Self-pay

## 2019-05-27 LAB — POCT URINALYSIS DIPSTICK
Bilirubin, UA: NEGATIVE
Blood, UA: NEGATIVE
Glucose, UA: NEGATIVE
Ketones, UA: NEGATIVE
Protein, UA: NEGATIVE
Spec Grav, UA: 1.015 (ref 1.010–1.025)
Urobilinogen, UA: 0.2 E.U./dL
pH, UA: 7 (ref 5.0–8.0)

## 2019-05-27 MED ORDER — SACCHAROMYCES BOULARDII 250 MG PO CAPS: 250.0000 mg | ORAL_CAPSULE | Freq: Two times a day (BID) | ORAL | 0 refills | Status: AC

## 2019-05-27 MED ORDER — NITROFURANTOIN MONOHYD MACRO 100 MG PO CAPS: 100.0000 mg | ORAL_CAPSULE | Freq: Two times a day (BID) | ORAL | 0 refills | Status: AC

## 2019-05-27 NOTE — Telephone Encounter (Signed)
Discussed results with patient, patient verbalized understanding of results  Confirmed pharmacy on file. RX's sent.

## 2019-05-27 NOTE — Telephone Encounter (Signed)
-----   Message from Sandrea Hughs, NP sent at 05/27/2019  3:51 PM EDT ----- Urine culture positive for urinary tract infection.start on Nitrofurantoin 100 mg tablet one by twice daily x 7 days along with florastor 250 mg tablet one by mouth twice daily x 10 days

## 2019-05-31 ENCOUNTER — Encounter: Payer: Medicare Other | Admitting: Physical Medicine and Rehabilitation

## 2019-06-11 ENCOUNTER — Other Ambulatory Visit: Payer: Self-pay | Admitting: *Deleted

## 2019-06-11 DIAGNOSIS — E876 Hypokalemia: Secondary | ICD-10-CM

## 2019-06-11 MED ORDER — HUMALOG KWIKPEN 200 UNIT/ML ~~LOC~~ SOPN: 20.0000 [IU] | PEN_INJECTOR | Freq: Three times a day (TID) | SUBCUTANEOUS | 0 refills | Status: DC

## 2019-06-11 MED ORDER — POTASSIUM CHLORIDE ER 10 MEQ PO CPCR: 20.0000 meq | ORAL_CAPSULE | Freq: Two times a day (BID) | ORAL | 0 refills | Status: DC | PRN

## 2019-06-11 NOTE — Telephone Encounter (Signed)
Please confirm that she is taking the potassium only when she is taking her lasix.  I'm just noticing she's refilling one but not the other.

## 2019-06-11 NOTE — Telephone Encounter (Signed)
Patient called back and stated that she is only taking the potassium when she takes her Lasix.

## 2019-06-11 NOTE — Telephone Encounter (Signed)
LMOM for patient to return call.

## 2019-06-11 NOTE — Telephone Encounter (Signed)
[place]  Pended Rx's and sent to Dr. Mariea Clonts for approval due to [place].

## 2019-06-14 ENCOUNTER — Other Ambulatory Visit: Payer: Self-pay | Admitting: *Deleted

## 2019-06-14 MED ORDER — TIZANIDINE HCL 2 MG PO TABS: ORAL_TABLET | ORAL | 1 refills | Status: DC

## 2019-06-14 MED ORDER — LEVOTHYROXINE SODIUM 137 MCG PO TABS: ORAL_TABLET | ORAL | 0 refills | Status: DC

## 2019-06-14 NOTE — Telephone Encounter (Signed)
[facility] 

## 2019-06-19 ENCOUNTER — Other Ambulatory Visit: Payer: Self-pay

## 2019-06-19 DIAGNOSIS — G8929 Other chronic pain: Secondary | ICD-10-CM

## 2019-06-19 MED ORDER — GABAPENTIN 600 MG PO TABS: 600.0000 mg | ORAL_TABLET | Freq: Three times a day (TID) | ORAL | 1 refills | Status: DC

## 2019-06-19 MED ORDER — OXYCODONE HCL 5 MG PO TABS: 5.0000 mg | ORAL_TABLET | Freq: Four times a day (QID) | ORAL | 0 refills | Status: DC | PRN

## 2019-06-19 NOTE — Progress Notes (Deleted)
Office Visit Note  Patient: Katherine Delgado             Date of Birth: 02-Sep-1940           MRN: [id number]             PCP: Gayland Curry, DO Referring: Gayland Curry, DO Visit Date: 07/03/2019 Occupation: @GUAROCC @  Subjective:  No chief complaint on file.   History of Present Illness: Katherine Delgado is a 78 y.o. female ***   Activities of Daily Living:  Patient reports morning stiffness for *** {minute/hour:19697}.   Patient {ACTIONS;DENIES/REPORTS:21021675::"Denies"} nocturnal pain.  Difficulty dressing/grooming: {ACTIONS;DENIES/REPORTS:21021675::"Denies"} Difficulty climbing stairs: {ACTIONS;DENIES/REPORTS:21021675::"Denies"} Difficulty getting out of chair: {ACTIONS;DENIES/REPORTS:21021675::"Denies"} Difficulty using hands for taps, buttons, cutlery, and/or writing: {ACTIONS;DENIES/REPORTS:21021675::"Denies"}  No Rheumatology ROS completed.   PMFS History:  Patient Active Problem List   Diagnosis Date Noted  . Mild episode of recurrent major depressive disorder ([place]) 10/22/2018  . Wound dehiscence 08/17/2018  . Lumbar spinal stenosis 03/30/2018  . Rheumatoid arthritis involving both wrists with positive rheumatoid factor ([place]) 06/14/2016  . High risk medication use 06/14/2016  . Sjogren's syndrome ([place]) 06/14/2016  . Primary osteoarthritis of both knees 06/14/2016  . DDD (degenerative disc disease), lumbar 06/14/2016  . Hypothyroidism 06/14/2016  . Osteoporosis 06/14/2016  . Abnormality of gait 04/19/2016  . Diabetes mellitus type 2 with peripheral artery disease ([place]) 11/27/2015  . Diabetes mellitus without complication ([place]) 123456  . Headache(784.0) 07/31/2013  . Sinus infection 07/31/2013  . Chronic low back pain 03/14/2013  . Insomnia 12/06/2012  . Nausea alone 12/06/2012  . Diarrhea 12/06/2012  . Urinary incontinence, urge 12/06/2012  . Lumbosacral root lesions, not elsewhere classified 11/27/2012  . Diabetic polyneuropathy associated with type 2  diabetes mellitus ([place]) 11/27/2012  . EDEMA 05/04/2010  . YEAST INFECTION 05/03/2010  . Hyperlipidemia 05/03/2010  . DEPRESSION 05/03/2010  . History of peripheral neuropathy 05/03/2010  . Essential hypertension 05/03/2010  . ALLERGIC RHINITIS 05/03/2010  . PNEUMONIA 05/03/2010  . COPD (chronic obstructive pulmonary disease) ([place]) 05/03/2010  . GERD 05/03/2010  . Fibromyalgia 05/03/2010  . DYSPNEA 05/03/2010  . CHEST PAIN 05/03/2010    Past Medical History:  Diagnosis Date  . Abnormality of gait 04/19/2016  . Acute infective polyneuritis ([place]) 2002  . Allergic rhinitis due to pollen   . Chronic pain syndrome   . COPD (chronic obstructive pulmonary disease) (HCC)    chronic bronchitis  . Depressive disorder, not elsewhere classified   . Diabetes mellitus without complication ([place])   . Diaphragmatic hernia without mention of obstruction or gangrene   . Dyslipidemia   . Fibromyalgia   . GERD (gastroesophageal reflux disease)    with h/o esophagitis  . Guillain-Barre syndrome ([place])   . History of benign thymus tumor   . Insomnia, unspecified   . Lumbar spinal stenosis 03/30/2018   L4-5 level, severe  . Miscarriage 1962  . Mixed hyperlipidemia   . Morbid obesity ([place])   . Osteoporosis   . Pneumonia   . Polyneuropathy in diabetes(357.2)   . Rheumatoid arthritis(714.0)   . Spondylosis, lumbosacral   . Spontaneous ecchymoses   . Type II or unspecified type diabetes mellitus with peripheral circulatory disorders, uncontrolled(250.72)   . Unspecified chronic bronchitis ([place])   . Unspecified essential hypertension   . Unspecified hypothyroidism   . Unspecified pruritic disorder   . Unspecified urinary incontinence     Family History  Problem Relation Age of Onset  . Alzheimer's disease Mother   .  Heart disease Mother   . Heart disease Father   . Liver disease Father   . Cancer Brother   . Arthritis Son   . Colon polyps Brother   . Colon cancer Neg Hx   . Esophageal  cancer Neg Hx   . Kidney disease Neg Hx   . Stomach cancer Neg Hx   . Rectal cancer Neg Hx    Past Surgical History:  Procedure Laterality Date  . ABDOMINAL HYSTERECTOMY  1974  . abdominal tumor  2002  . APPENDECTOMY    . APPLICATION OF A-CELL OF BACK N/A 09/26/2018   Procedure: With Acell;  Surgeon: Wallace Going, DO;  Location: [facility];  Service: Plastics;  Laterality: N/A;  . APPLICATION OF WOUND VAC N/A 09/26/2018   Procedure: Vac placement;  Surgeon: Wallace Going, DO;  Location: [facility];  Service: Plastics;  Laterality: N/A;  . [place]  . BRONCHOSCOPY  2001  . CHOLECYSTECTOMY  1984  . INCISION AND DRAINAGE OF WOUND N/A 09/26/2018   Procedure: Debridement of spine wound;  Surgeon: Wallace Going, DO;  Location: [facility];  Service: Plastics;  Laterality: N/A;  . KNEE SURGERY Bilateral 08/27/2010 (L) and 01/11/2011 (R)  . LUMBAR LAMINECTOMY/DECOMPRESSION MICRODISCECTOMY N/A 07/03/2018   Procedure: Lumbar Three to Lumbar Five Laminectomy;  Surgeon: Judith Part, MD;  Location: [facility];  Service: Neurosurgery;  Laterality: N/A;  . LUMBAR WOUND DEBRIDEMENT N/A 08/20/2018   Procedure: POSTERIOR LUMBAR SPINAL WOUND DEBRIDEMENT AND REVISION;  Surgeon: Judith Part, MD;  Location: [facility];  Service: Neurosurgery;  Laterality: N/A;  POSTERIOR LUMBAR SPINAL WOUND REVISION  . [place]  . OVARIAN CYST SURGERY  1968  . thymus tumor    . 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 TONSILLECTOMY     Social History   Social History Narrative   Walks with cane   Right handed    Caffeine use: Coffee (2 cups every morning)   Tea: sometimes   Soda: none   Immunization History  Administered Date(s) Administered  . Influenza-Unspecified 06/10/2011  . Pneumococcal Conjugate-13 07/07/2016  . Pneumococcal Polysaccharide-23 11/21/2003, 10/05/2017  . Tdap 05/28/2016     Objective: Vital Signs: There were no vitals taken for this visit.   Physical Exam   Musculoskeletal  Exam: ***  CDAI Exam: CDAI Score: - Patient Global: -; Provider Global: - Swollen: -; Tender: - Joint Exam   No joint exam has been documented for this visit   There is currently no information documented on the homunculus. Go to the Rheumatology activity and complete the homunculus joint exam.  Investigation: No additional findings.  Imaging: No results found.  Recent Labs: Lab Results  Component Value Date   WBC 13.1 (H) 04/09/2019   HGB 13.0 04/09/2019   PLT 351 04/09/2019   NA 141 04/09/2019   K 5.3 04/09/2019   CL 101 04/09/2019   CO2 31 04/09/2019   GLUCOSE 220 (H) 04/09/2019   BUN 19 04/09/2019   CREATININE 0.88 04/09/2019   BILITOT 0.4 04/09/2019   ALKPHOS 113 02/20/2017   AST 13 04/09/2019   ALT 12 04/09/2019   PROT 6.2 04/09/2019   ALBUMIN 2.6 (L) 08/22/2018   CALCIUM 9.8 04/09/2019   GFRAA 73 04/09/2019   QFTBGOLDPLUS NEGATIVE 04/02/2019    Speciality Comments: No specialty comments available.  Procedures:  No procedures performed Allergies: Influenza vaccines, Penicillins, Statins, and Sulfamethoxazole-trimethoprim   Assessment / Plan:     Visit Diagnoses:  No diagnosis found.  Orders: No orders of the defined types were placed in this encounter.  No orders of the defined types were placed in this encounter.   Face-to-face time spent with patient was *** minutes. Greater than 50% of time was spent in counseling and coordination of care.  Follow-Up Instructions: No follow-ups on file.   Earnestine Mealing, CMA  Note - This record has been created using Editor, commissioning.  Chart creation errors have been sought, but may not always  have been located. Such creation errors do not reflect on  the standard of medical care.

## 2019-06-19 NOTE — Telephone Encounter (Signed)
Patient called and requested refill for oxycodone 5 mg tablet. Verified in [facility] database last refill was 05/20/2019. Her last appointment was 05/22/2019 and her next appointment is scheduled 08/19/2019. Routing to provider for approval

## 2019-07-03 ENCOUNTER — Ambulatory Visit: Payer: Medicare Other | Admitting: Rheumatology

## 2019-07-05 ENCOUNTER — Encounter: Payer: Self-pay | Admitting: Physical Medicine and Rehabilitation

## 2019-07-10 ENCOUNTER — Encounter: Payer: Medicare Other | Admitting: Physical Medicine and Rehabilitation

## 2019-07-23 ENCOUNTER — Other Ambulatory Visit: Payer: Self-pay | Admitting: Family

## 2019-07-23 DIAGNOSIS — M5441 Lumbago with sciatica, right side: Secondary | ICD-10-CM

## 2019-07-23 DIAGNOSIS — G8929 Other chronic pain: Secondary | ICD-10-CM

## 2019-07-23 MED ORDER — OXYCODONE HCL 5 MG PO TABS: 5.0000 mg | ORAL_TABLET | Freq: Four times a day (QID) | ORAL | 0 refills | Status: DC | PRN

## 2019-07-23 MED ORDER — AMLODIPINE BESYLATE 10 MG PO TABS: 10.0000 mg | ORAL_TABLET | Freq: Every day | ORAL | 1 refills | Status: DC

## 2019-07-23 NOTE — Telephone Encounter (Signed)
Patient requesting refill for oxycodone 5 mg tablet. Verified last refill was 06/19/2019. Patient has no controlled medication contract on file. Called patient to schedule an appointment. Scheduled an appointment for patient to come in for renewing contract.

## 2019-07-25 ENCOUNTER — Encounter: Payer: Self-pay | Admitting: Internal Medicine

## 2019-07-25 ENCOUNTER — Ambulatory Visit (INDEPENDENT_AMBULATORY_CARE_PROVIDER_SITE_OTHER): Payer: Medicare Other | Admitting: Internal Medicine

## 2019-07-25 ENCOUNTER — Other Ambulatory Visit: Payer: Self-pay

## 2019-07-25 VITALS — BP 128/72 | HR 65 | Temp 97.8°F | Ht 66.0 in | Wt 227.0 lb

## 2019-07-25 DIAGNOSIS — Z794 Long term (current) use of insulin: Secondary | ICD-10-CM

## 2019-07-25 DIAGNOSIS — M5441 Lumbago with sciatica, right side: Secondary | ICD-10-CM | POA: Diagnosis not present

## 2019-07-25 DIAGNOSIS — E1142 Type 2 diabetes mellitus with diabetic polyneuropathy: Secondary | ICD-10-CM

## 2019-07-25 DIAGNOSIS — M797 Fibromyalgia: Secondary | ICD-10-CM

## 2019-07-25 DIAGNOSIS — G8929 Other chronic pain: Secondary | ICD-10-CM

## 2019-07-25 DIAGNOSIS — M1711 Unilateral primary osteoarthritis, right knee: Secondary | ICD-10-CM | POA: Diagnosis not present

## 2019-07-25 NOTE — Progress Notes (Signed)
Location:  [facility] clinic Provider: Shauntelle Jamerson L. Mariea Clonts, D.O., C.M.D.  Goals of Care:  Advanced Directives 01/11/2019  Does Patient Have a Medical Advance Directive? Yes  Type of Advance Directive Living will;Healthcare Power of Attorney  Does patient want to make changes to medical advance directive? -  Copy of [place] in Chart? -  Would patient like information on creating a medical advance directive? -  Pre-existing out of facility DNR order (yellow form or pink MOST form) -     Chief Complaint  Patient presents with  . Medication Management    Review medications and update contract     HPI: Patient is a 78 y.o. female seen today for an update of her controlled substance contract and to review her medications.   She continues to use oxycodone 5mg  about every 6 hrs as needed.  She says she sometimes needs it slightly more frequently.  She does take tylenol, as well.  She does not sleep at night that well.  She uses tylenol at that time.  Tries to keep busy reading, doing crafts, tv.  She also uses the xanaflex moreso than her gabapentin which is 600mg  po tid.  Dr. Estanislado Pandy was managing her RA with Arava--pt no longer taking at this time.  She's been referred to physical medicine and rehab now.   As we talk, pt goes on to say she needs more pain medication and wonders about going back to hydrocodone/apap rather than the oxycodone for better pain relief, but my concern is that she also takes tylenol and could take too much easily in that instance.   Opioid contract was reviewed and signed here today.  We again discussed that ideally, since she's recovered from her back surgery, the oxycodone should continue to be weaned.  She previously was on both oxycodone and hydrocodone and I've gradually reduced her oxycodone monthly to 90 pills per month.    She sees too many different doctors and often presents to each of [place] as if she's never been told the explanation for each problem  despite documentation and education otherwise (like her CHF, for example).  She sees me for routine care again in January.  Past Medical History:  Diagnosis Date  . Abnormality of gait 04/19/2016  . Acute infective polyneuritis ([place]) 2002  . Allergic rhinitis due to pollen   . Chronic pain syndrome   . COPD (chronic obstructive pulmonary disease) (HCC)    chronic bronchitis  . Depressive disorder, not elsewhere classified   . Diabetes mellitus without complication ([place])   . Diaphragmatic hernia without mention of obstruction or gangrene   . Dyslipidemia   . Fibromyalgia   . GERD (gastroesophageal reflux disease)    with h/o esophagitis  . Guillain-Barre syndrome ([place])   . History of benign thymus tumor   . Insomnia, unspecified   . Lumbar spinal stenosis 03/30/2018   L4-5 level, severe  . Miscarriage 1962  . Mixed hyperlipidemia   . Morbid obesity ([place])   . Osteoporosis   . Pneumonia   . Polyneuropathy in diabetes(357.2)   . Rheumatoid arthritis(714.0)   . Spondylosis, lumbosacral   . Spontaneous ecchymoses   . Type II or unspecified type diabetes mellitus with peripheral circulatory disorders, uncontrolled(250.72)   . Unspecified chronic bronchitis ([place])   . Unspecified essential hypertension   . Unspecified hypothyroidism   . Unspecified pruritic disorder   . Unspecified urinary incontinence     Past Surgical History:  Procedure Laterality Date  .  ABDOMINAL HYSTERECTOMY  1974  . abdominal tumor  2002  . APPENDECTOMY    . APPLICATION OF A-CELL OF BACK N/A 09/26/2018   Procedure: With Acell;  Surgeon: Wallace Going, DO;  Location: [facility];  Service: Plastics;  Laterality: N/A;  . APPLICATION OF WOUND VAC N/A 09/26/2018   Procedure: Vac placement;  Surgeon: Wallace Going, DO;  Location: [facility];  Service: Plastics;  Laterality: N/A;  . [place]  . BRONCHOSCOPY  2001  . CHOLECYSTECTOMY  1984  . INCISION AND DRAINAGE OF WOUND N/A 09/26/2018   Procedure:  Debridement of spine wound;  Surgeon: Wallace Going, DO;  Location: [facility];  Service: Plastics;  Laterality: N/A;  . KNEE SURGERY Bilateral 08/27/2010 (L) and 01/11/2011 (R)  . LUMBAR LAMINECTOMY/DECOMPRESSION MICRODISCECTOMY N/A 07/03/2018   Procedure: Lumbar Three to Lumbar Five Laminectomy;  Surgeon: Judith Part, MD;  Location: [facility];  Service: Neurosurgery;  Laterality: N/A;  . LUMBAR WOUND DEBRIDEMENT N/A 08/20/2018   Procedure: POSTERIOR LUMBAR SPINAL WOUND DEBRIDEMENT AND REVISION;  Surgeon: Judith Part, MD;  Location: [facility];  Service: Neurosurgery;  Laterality: N/A;  POSTERIOR LUMBAR SPINAL WOUND REVISION  . [place]  . OVARIAN CYST SURGERY  1968  . thymus tumor    . 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 TONSILLECTOMY      Allergies  Allergen Reactions  . Influenza Vaccines Other (See Comments)    "h/o Guillain Barre; dr's told me years ago never to take another flu shot as it could relapse Rosalee Kaufman; has to do with when vaccine being changed to H1N1 virus"  . Penicillins Hives, Itching, Swelling and Rash    Has patient had a PCN reaction causing immediate rash, facial/tongue/throat swelling, SOB or lightheadedness with hypotension:Unknown Has patient had a PCN reaction causing severe rash involving mucus membranes or skin necrosis: Unknown Has patient had a PCN reaction that required hospitalization:Unknown Has patient had a PCN reaction occurring within the last 10 years: Unknown If all of the above answers are "NO", then may proceed with Cephalosporin use.   . Statins Other (See Comments)    Myopathy, transaminitis  . Sulfamethoxazole-Trimethoprim Itching    Outpatient Encounter Medications as of 07/25/2019  Medication Sig  . acetaminophen (TYLENOL) 325 MG tablet Take 650 mg by mouth every 6 (six) hours as needed (for pain.).  Marland Kitchen albuterol (PROVENTIL HFA;VENTOLIN HFA) 108 (90 Base) MCG/ACT inhaler Inhale 2 puffs into the lungs every 6 (six) hours as  needed for wheezing or shortness of breath.  Marland Kitchen amLODipine (NORVASC) 10 MG tablet Take 1 tablet (10 mg total) by mouth daily.  Marland Kitchen aspirin EC 81 MG tablet Take 81 mg by mouth 2 (two) times a week.  . B-D UF III MINI PEN NEEDLES 31G X 5 MM MISC USE AS DIRECTED  . Chlorphen-Phenyleph-ASA (ALKA-SELTZER PLUS COLD PO) Take by mouth as needed.  . cholecalciferol (VITAMIN D3) 25 MCG (1000 UT) tablet Take 1,000 Units by mouth daily.  Marland Kitchen docusate sodium (COLACE) 100 MG capsule Take 100 mg by mouth daily as needed (constipation.).   Marland Kitchen estradiol (ESTRACE) 0.1 MG/GM vaginal cream Place 1 Applicatorful vaginally 3 (three) times a week.  . folic acid (FOLVITE) 1 MG tablet Take 2 tablets (2 mg total) by mouth daily.  . furosemide (LASIX) 20 MG tablet TAKE 1 TABLET BY MOUTH TWICE DAILY AS NEEDED FOR 3 POUND WEIGHT GAIN IN ONE DAY OR 5 POUNDS IN ONE WEEK  . gabapentin (  NEURONTIN) 600 MG tablet Take 1 tablet (600 mg total) by mouth 3 (three) times daily.  . Insulin Lispro (HUMALOG KWIKPEN) 200 UNIT/ML SOPN Inject 20 Units into the skin 3 (three) times daily.  Marland Kitchen levothyroxine (SYNTHROID) 137 MCG tablet Take one tablet by mouth once daily 30 minutes before breakfast  . lisinopril (ZESTRIL) 20 MG tablet TAKE 1 TABLET(20 MG) BY MOUTH DAILY  . loperamide (IMODIUM A-D) 2 MG tablet Take 2 mg by mouth 4 (four) times daily as needed for diarrhea or loose stools.  . magnesium oxide (MAG-OX) 400 MG tablet Take 1 tablet (400 mg total) by mouth daily as needed (for leg cramps).  . Melatonin 10 MG TABS Take 10 mg by mouth at bedtime.  . metoprolol tartrate (LOPRESSOR) 50 MG tablet TAKE 1 TABLET(50 MG) BY MOUTH TWICE DAILY  . Multiple Vitamins-Minerals (MULTIVITAMIN ADULT PO) Take 1 tablet by mouth daily.  Marland Kitchen MYRBETRIQ 50 MG TB24 tablet TAKE 1 TABLET(50 MG) BY MOUTH DAILY  . nystatin (NYSTATIN) powder APPLY TOPICALLY THREE TIMES DAILY AS DIRECTED FOR 14 DAYS  . nystatin cream (MYCOSTATIN) Apply 1 application topically 2 (two) times  daily. To skin folds  . omega-3 acid ethyl esters (LOVAZA) 1 g capsule TAKE ONE CAPSULE BY MOUTH EVERY DAY  . oxyCODONE (ROXICODONE) 5 MG immediate release tablet Take 1 tablet (5 mg total) by mouth every 6 (six) hours as needed for severe pain.  . polyvinyl alcohol (LIQUIFILM TEARS) 1.4 % ophthalmic solution Place 1 drop into both eyes 4 (four) times daily as needed (for dry/irritated eyes).   . potassium chloride (MICRO-K) 10 MEQ CR capsule Take 2 capsules (20 mEq total) by mouth 2 (two) times daily as needed (when taking a dose of lasix.).  Marland Kitchen tiZANidine (ZANAFLEX) 2 MG tablet Take one tablet by mouth three times daily  . TOUJEO SOLOSTAR 300 UNIT/ML SOPN INJECT 100 UNITS INTO THE SKIN EVERY NIGHT AT BEDTIME  . triamterene-hydrochlorothiazide (MAXZIDE-25) 37.5-25 MG tablet Take 1 tablet by mouth daily.  Marland Kitchen umeclidinium-vilanterol (ANORO ELLIPTA) 62.5-25 MCG/INH AEPB Inhale 1 puff into the lungs daily.  Marland Kitchen venlafaxine XR (EFFEXOR-XR) 75 MG 24 hr capsule TAKE 1 CAPSULE BY MOUTH EVERY DAY WITH BREAKFAST  . VICTOZA 18 MG/3ML SOPN ADMINISTER 1.8 MG UNDER THE SKIN DAILY FOR BLOOD SUGAR  . [DISCONTINUED] fluconazole (DIFLUCAN) 150 MG tablet Take one tablet by mouth every 72 hours x 3 doses then Take one by mouth once a week x 6 months.  . [DISCONTINUED] leflunomide (ARAVA) 10 MG tablet Take 1 tablet (10 mg total) by mouth daily.   No facility-administered encounter medications on file as of 07/25/2019.     Review of Systems:  Review of Systems  Constitutional: Positive for malaise/fatigue. Negative for chills and fever.  HENT: Negative for congestion and sore throat.   Eyes: Negative for blurred vision.  Respiratory: Negative for cough and shortness of breath.   Cardiovascular: Negative for chest pain, palpitations and leg swelling.  Gastrointestinal: Positive for constipation. Negative for abdominal pain.  Genitourinary: Negative for dysuria.  Musculoskeletal: Positive for back pain, joint pain and  myalgias. Negative for falls.  Skin: Negative for itching and rash.  Neurological: Positive for tingling and sensory change. Negative for dizziness and loss of consciousness.  Psychiatric/Behavioral: Positive for depression. Negative for memory loss. The patient has insomnia. The patient is not nervous/anxious.     Health Maintenance  Topic Date Due  . OPHTHALMOLOGY EXAM  12/20/1950  . HEMOGLOBIN A1C  10/10/2019  .  FOOT EXAM  04/17/2020  . DEXA SCAN  09/20/2025  . TETANUS/TDAP  05/28/2026  . PNA vac Low Risk Adult  Completed  . INFLUENZA VACCINE  Discontinued    Physical Exam: Vitals:   07/25/19 0945  BP: 128/72  Pulse: 65  Temp: 97.8 F (36.6 C)  TempSrc: Temporal  SpO2: 96%  Weight: 227 lb (103 kg)  Height: 5\' 6"  (1.676 m)   Body mass index is 36.64 kg/m. Physical Exam Vitals signs reviewed.  Constitutional:      General: She is not in acute distress.    Appearance: Normal appearance. She is obese. She is not ill-appearing or toxic-appearing.  HENT:     Head: Normocephalic and atraumatic.  Cardiovascular:     Rate and Rhythm: Normal rate and regular rhythm.     Heart sounds: No murmur.  Pulmonary:     Effort: Pulmonary effort is normal.     Breath sounds: Normal breath sounds. No rales.  Musculoskeletal: Normal range of motion.     Right lower leg: No edema.     Left lower leg: No edema.     Comments: Came in from car in manual wheelchair  Skin:    General: Skin is warm and dry.     Comments: Dry, scaly skin  Neurological:     General: No focal deficit present.     Mental Status: She is alert and oriented to person, place, and time.  Psychiatric:        Mood and Affect: Mood normal.     Labs reviewed: Basic Metabolic Panel: Recent Labs    08/09/18 1224  08/22/18 0447  12/07/18 0956 04/02/19 1219 04/09/19 0820  NA 142   < > 135   < > 143 140 141  K 4.1   < > 4.0   < > 4.1 5.3 5.3  CL 104   < > 99   < > 104 103 101  CO2 30   < > 28   < > 32 29 31   GLUCOSE 45*   < > 270*   < > 116* 171* 220*  BUN 19   < > 22   < > 16 31* 19  CREATININE 1.00*   < > 0.93   < > 0.91 1.15* 0.88  CALCIUM 9.8   < > 8.5*   < > 9.6 9.8 9.8  PHOS  --   --  2.5  --   --   --   --   TSH 0.84  --   --   --  3.75  --   --    < > = values in this interval not displayed.   Liver Function Tests: Recent Labs    08/22/18 0447 12/07/18 0956 04/02/19 1219 04/09/19 0820  AST  --  14 16 13   ALT  --  10 12 12   BILITOT  --  0.4 0.3 0.4  PROT  --  6.9 6.7  6.9 6.2  ALBUMIN 2.6*  --   --   --    No results for input(s): LIPASE, AMYLASE in the last 8760 hours. No results for input(s): AMMONIA in the last 8760 hours. CBC: Recent Labs    12/07/18 0956 04/02/19 1219 04/09/19 0820  WBC 12.9* 14.1* 13.1*  NEUTROABS 9,520* 10,321* 9,484*  HGB 14.0 13.5 13.0  HCT 43.4 41.5 40.5  MCV 80.8 83.3 85.4  PLT 339 343 351   Lipid Panel: Recent Labs    08/09/18 1224  12/07/18 0956  CHOL 159 142  HDL 30* 38*  LDLCALC 97 83  TRIG 219* 118  CHOLHDL 5.3* 3.7   Lab Results  Component Value Date   HGBA1C 8.6 (H) 04/09/2019    Assessment/Plan 1. Type 2 diabetes mellitus with diabetic polyneuropathy, with long-term current use of insulin (HCC) -continue current insulin regimen, has labs in jan to reassess hba1c -cont gabapentin for this pain  2. Primary osteoarthritis of right knee -ongoing, cont tylenol, oxycodone only to be used for severe pain--contract renewed today--copy in letters/communications  3. Chronic right-sided low back pain with right-sided sciatica -s/p lumbar laminectomy, all healed, uses wheelchair more since that surgery and the dehiscence, wound infection, etc postop  4. Fibromyalgia -does not exercise, has been on effexor for a long time, gabapentin, muscle relaxers, no matter what we've tried over the years, pain has remained an issue  Labs/tests ordered:  No new; has labs already ordered for Jan and f/u appt  Next appt:   09/02/2019  Mahir Prabhakar L. Pearle Wandler, D.O. [facility] [address], [place] [address] Cell Phone (Mon-Fri 8am-5pm):  [phone] On Call:  [phone] & follow prompts after 5pm & weekends Office Phone:  [phone] Office Fax:  [phone]

## 2019-07-29 ENCOUNTER — Telehealth (INDEPENDENT_AMBULATORY_CARE_PROVIDER_SITE_OTHER): Payer: Medicare Other | Admitting: Rheumatology

## 2019-07-29 ENCOUNTER — Encounter: Payer: Self-pay | Admitting: Rheumatology

## 2019-07-29 ENCOUNTER — Other Ambulatory Visit: Payer: Self-pay

## 2019-07-29 DIAGNOSIS — Z8709 Personal history of other diseases of the respiratory system: Secondary | ICD-10-CM

## 2019-07-29 DIAGNOSIS — M170 Bilateral primary osteoarthritis of knee: Secondary | ICD-10-CM

## 2019-07-29 DIAGNOSIS — M5136 Other intervertebral disc degeneration, lumbar region: Secondary | ICD-10-CM

## 2019-07-29 DIAGNOSIS — Z8639 Personal history of other endocrine, nutritional and metabolic disease: Secondary | ICD-10-CM

## 2019-07-29 DIAGNOSIS — Z79899 Other long term (current) drug therapy: Secondary | ICD-10-CM | POA: Diagnosis not present

## 2019-07-29 DIAGNOSIS — M3501 Sicca syndrome with keratoconjunctivitis: Secondary | ICD-10-CM | POA: Diagnosis not present

## 2019-07-29 DIAGNOSIS — M0579 Rheumatoid arthritis with rheumatoid factor of multiple sites without organ or systems involvement: Secondary | ICD-10-CM | POA: Diagnosis not present

## 2019-07-29 DIAGNOSIS — Z8679 Personal history of other diseases of the circulatory system: Secondary | ICD-10-CM

## 2019-07-29 DIAGNOSIS — M810 Age-related osteoporosis without current pathological fracture: Secondary | ICD-10-CM

## 2019-07-29 DIAGNOSIS — Z8659 Personal history of other mental and behavioral disorders: Secondary | ICD-10-CM

## 2019-07-29 DIAGNOSIS — M51369 Other intervertebral disc degeneration, lumbar region without mention of lumbar back pain or lower extremity pain: Secondary | ICD-10-CM

## 2019-07-29 DIAGNOSIS — Z8719 Personal history of other diseases of the digestive system: Secondary | ICD-10-CM | POA: Diagnosis not present

## 2019-07-29 DIAGNOSIS — Z8669 Personal history of other diseases of the nervous system and sense organs: Secondary | ICD-10-CM

## 2019-07-29 DIAGNOSIS — M797 Fibromyalgia: Secondary | ICD-10-CM

## 2019-07-29 DIAGNOSIS — G4709 Other insomnia: Secondary | ICD-10-CM | POA: Diagnosis not present

## 2019-07-29 NOTE — Progress Notes (Signed)
Virtual Visit via Telephone Note  I connected with Katherine Delgado on 07/29/19 at  3:00 PM EST by telephone and verified that I am speaking with the correct person using two identifiers.  Location: Patient: home  Provider: Clinic  This service was conducted via virtual visit.   The patient was located at home. I was located in my office.  Consent was obtained prior to the virtual visit and is aware of possible charges through their insurance for this visit.  The patient is an established patient.  Dr. Estanislado Pandy, MD conducted the virtual visit and [name], PA-C acted as scribe during the service.  Office staff helped with scheduling follow up visits after the service was conducted.    I discussed the limitations, risks, security and privacy concerns of performing an evaluation and management service by telephone and the availability of in person appointments. I also discussed with the patient that there may be a patient responsible charge related to this service. The patient expressed understanding and agreed to proceed.  CC: Generalized pain  History of Present Illness: Patient is a 78 year old female with a past medical history of seropositive rheumatoid arthritis, fibromyalgia, Sjogren's, and DDD.  She discontinued Arava due to experiencing diarrhea. She is not taking any immunosuppressive agents at this time. She would like to discuss restarting on [place].  She denies any recent infections. She has generalized muscle aches and joint pain.  She is having more difficulty with mobility due to the discomfort she experiences.  Review of Systems  Constitutional: Negative for fever and malaise/fatigue.  Eyes: Negative for photophobia, pain, discharge and redness.  Respiratory: Negative for cough, shortness of breath and wheezing.   Cardiovascular: Negative for chest pain and palpitations.  Gastrointestinal: Negative for blood in stool, constipation and diarrhea.  Genitourinary: Negative for  dysuria.  Musculoskeletal: Positive for joint pain and myalgias. Negative for back pain and neck pain.       +Morning stiffness   Skin: Negative for rash.  Neurological: Negative for dizziness and headaches.  Psychiatric/Behavioral: Negative for depression. The patient is not nervous/anxious and does not have insomnia.       Observations/Objective: Physical Exam  Constitutional: She is oriented to person, place, and time.  Neurological: She is alert and oriented to person, place, and time.  Psychiatric: Mood, memory, affect and judgment normal.   Patient reports morning stiffness for several  hours.   Patient reports nocturnal pain.  Difficulty dressing/grooming: Denies Difficulty climbing stairs: Reports Difficulty getting out of chair: Reports Difficulty using hands for taps, buttons, cutlery, and/or writing: Denies    Assessment and Plan: Visit Diagnoses: Rheumatoid arthritis involving multiple sites with positive rheumatoid factor (HCC) -Severe rheumatoid arthritis with synovitis involving multiple joints and ulnar deviation.  Treating her with DMARDs has been an issue due to noncompliance and concerned about the side effects in the past: She is having increased pain and intermittent inflammation in both hands, both wrist joints, and both knee joints. She is not taking any immunosuppressive agents at this time.  She discontinued Arava due to experiencing diarrhea.  She would like to restart on [place].  We explained the indications, contraindications, or potential side effects of Xeljanz.  She has a history of recurrent infections on MTX and Xeljanz.  We will update lab work and if labs are stable we will apply for Morrie Sheldon.  We will start her on low dose xeljanz 5 mg 1 tablet daily and assess her response.  She will  return to the office in 6-8 weeks.   Medication counseling: Baseline Immunosuppressant Therapy Labs TB GOLD Quantiferon TB Gold Latest Ref Rng & Units 04/02/2019   Quantiferon TB Gold Plus NEGATIVE NEGATIVE   Hepatitis Panel Hepatitis Latest Ref Rng & Units 04/02/2019  Hep B Surface Ag NON-REACTI NON-REACTIVE  Hep B IgM NON-REACTI NON-REACTIVE  Hep C Ab NON-REACTI NON-REACTIVE  Hep C Ab NON-REACTI NON-REACTIVE   HIV No results found for: HIV Immunoglobulins Immunoglobulin Electrophoresis Latest Ref Rng & Units 04/02/2019  IgA  70 - 320 mg/dL 373(H)  IgG 600 - 1,540 mg/dL 937  IgM 50 - 300 mg/dL 101   SPEP Serum Protein Electrophoresis Latest Ref Rng & Units 04/09/2019  Total Protein 6.1 - 8.1 g/dL 6.2  Albumin 3.8 - 4.8 g/dL -  Alpha-1 0.2 - 0.3 g/dL -  Alpha-2 0.5 - 0.9 g/dL -  Beta Globulin 0.4 - 0.6 g/dL -  Beta 2 0.2 - 0.5 g/dL -  Gamma Globulin 0.8 - 1.7 g/dL -   G6PD No results found for: G6PDH TPMT No results found for: TPMT  LIPIDS Lab Results  Component Value Date   CHOL 142 12/07/2018   HDL 38 (L) 12/07/2018   LDLCALC 83 12/07/2018   TRIG 118 12/07/2018   CHOLHDL 3.7 12/07/2018     Chest x-ray: No evidence of acute cardiopulmonary abnormality on 01/08/16   Does patient have history of diverticulitis?  No  Counseled patient that Morrie Sheldon is a JAK inhibitor.  Counseled patient on purpose, proper use, and adverse effects of Xeljanz.  Reviewed the most common adverse effects including infection, diarrhea, headaches. Counseled on the increased risk of DVT/PE.  Also reviewed rare adverse effects such as bowel injury and the need to contact us if develops stomach pain during treatment.  Reviewed with patient that there is the possibility of an increased risk of malignancy but it is not well understood if this increased risk is due to the medication or the disease state.  Counseled that Morrie Sheldon should be held for infection and prior to surgery.  Counseled patient to avoid live vaccines while on [place].  Recommend annual influenza, Pneumovax 23, Prevnar 13, and Shingrix as indicated.   Reviewed importance of routine lab monitoring  including a lipid panel.  Standing orders placed and patient is to return in 1 month and then every 3 months after initiation. Provided patient with medication education material and answered all questions.  Patient consented to [place].  Will upload Morrie Sheldon into patient's chart.  Will apply through patient's insurance and update when we receive a response.  Patient dose will be 5 mg 1 tablet daily.  Prescription will be sent to pharmacy pending lab results and/or insurance approval.  Sjogren's syndrome with keratoconjunctivitis sicca ([place]) : She has chronic sicca symptoms. She uses eye drops for symptomatic relief.   High risk medication use -She is aware of the risks of restarting on Morrie Sheldon but she would like to proceed with reapplying for xeljanz 5 mg 1 tablet daily.  She is due to update CBC and CMP.  Standing orders are in place. D/c Arava-SE of diarrhea, d/c MTX and Xeljanz in the past due to recurrent infections.  Primary osteoarthritis of both knees -She has severe osteoarthritis in her knee joints.  She has been having difficulty with ambulation due to the discomfort.   Fibromyalgia -She has persistent generalized pain and tender points due to fibromyalgia.  She has generalized muscle aches.  She continues to have chronic fatigue  related to insomnia and nocturnal pain.   DDD (degenerative disc disease), lumbar -Chronic pain  Age-related osteoporosis without current pathological fracture - DEXA ordered at last visit.    Other insomnia -secondary to pain.  Other medical conditions are listed as follows:   History of gastroesophageal reflux (GERD)   History of hypertension   History of Guillain-Barre syndrome  History of peripheral neuropathy   History of diabetes mellitus   History of depression  History of COPD  Follow Up Instructions: She will follow up in 6-8 weeks.    I discussed the assessment and treatment plan with the patient. The patient was provided  an opportunity to ask questions and all were answered. The patient agreed with the plan and demonstrated an understanding of the instructions.   The patient was advised to call back or seek an in-person evaluation if the symptoms worsen or if the condition fails to improve as anticipated.  I provided 25 minutes of non-face-to-face time during this encounter.   Bo Merino, MD   Scribed by-  [name], PA-C

## 2019-07-30 ENCOUNTER — Telehealth: Payer: Self-pay | Admitting: *Deleted

## 2019-07-30 NOTE — Telephone Encounter (Signed)
Submitted a Prior Authorization request to [facility] for [facility] via Cover My Meds. Will update once we receive a response.

## 2019-07-30 NOTE — Telephone Encounter (Signed)
Received notification from [facility] regarding a prior authorization for [facility]. Authorization has been APPROVED from 07/30/19 to 08/21/20.   Will send document to scan center.  Authorization # [id number] Phone # [phone]  2:46 PM Kenyata Napier Delice Lesch, CPhT

## 2019-07-30 NOTE — Telephone Encounter (Signed)
Please apply for Xeljanz, 5 mg, 1 tablet daily (reduced dose). Thank you.

## 2019-07-31 ENCOUNTER — Ambulatory Visit: Payer: Medicare Other | Admitting: Rheumatology

## 2019-07-31 ENCOUNTER — Other Ambulatory Visit: Payer: Self-pay | Admitting: *Deleted

## 2019-07-31 MED ORDER — XELJANZ 5 MG PO TABS: 5.0000 mg | ORAL_TABLET | Freq: Every day | ORAL | 0 refills | Status: DC

## 2019-07-31 NOTE — Progress Notes (Signed)
received fax from [facility] that [place] 5mg  tab comes in an unbreakable bottle of 60. They are unable to process rx, need a new one sent. New prescription has been [facility].

## 2019-07-31 NOTE — Addendum Note (Signed)
Addended by: Francis Gaines C on: 07/31/2019 12:42 PM   Modules accepted: Orders

## 2019-08-02 ENCOUNTER — Other Ambulatory Visit: Payer: Self-pay

## 2019-08-02 ENCOUNTER — Telehealth: Payer: Self-pay | Admitting: Rheumatology

## 2019-08-02 DIAGNOSIS — Z79899 Other long term (current) drug therapy: Secondary | ICD-10-CM | POA: Diagnosis not present

## 2019-08-02 NOTE — Telephone Encounter (Signed)
Lab results released for [facility].

## 2019-08-02 NOTE — Telephone Encounter (Signed)
Patient at [facility] now. Please release orders.

## 2019-08-03 LAB — CBC WITH DIFFERENTIAL/PLATELET
Absolute Monocytes: 961 cells/uL — ABNORMAL HIGH (ref 200–950)
Basophils Absolute: 124 cells/uL (ref 0–200)
Basophils Relative: 0.8 %
Eosinophils Absolute: 434 cells/uL (ref 15–500)
Eosinophils Relative: 2.8 %
HCT: 43.1 % (ref 35.0–45.0)
Hemoglobin: 14 g/dL (ref 11.7–15.5)
Lymphs Abs: 1953 cells/uL (ref 850–3900)
MCH: 27 pg (ref 27.0–33.0)
MCHC: 32.5 g/dL (ref 32.0–36.0)
MCV: 83 fL (ref 80.0–100.0)
MPV: 11 fL (ref 7.5–12.5)
Monocytes Relative: 6.2 %
Neutro Abs: 12028 cells/uL — ABNORMAL HIGH (ref 1500–7800)
Neutrophils Relative %: 77.6 %
Platelets: 350 10*3/uL (ref 140–400)
RBC: 5.19 10*6/uL — ABNORMAL HIGH (ref 3.80–5.10)
RDW: 12.5 % (ref 11.0–15.0)
Total Lymphocyte: 12.6 %
WBC: 15.5 10*3/uL — ABNORMAL HIGH (ref 3.8–10.8)

## 2019-08-03 LAB — COMPLETE METABOLIC PANEL WITHOUT GFR
AG Ratio: 1.2 (calc) (ref 1.0–2.5)
ALT: 7 U/L (ref 6–29)
AST: 10 U/L (ref 10–35)
Albumin: 3.7 g/dL (ref 3.6–5.1)
Alkaline phosphatase (APISO): 107 U/L (ref 37–153)
BUN: 14 mg/dL (ref 7–25)
CO2: 27 mmol/L (ref 20–32)
Calcium: 9.5 mg/dL (ref 8.6–10.4)
Chloride: 99 mmol/L (ref 98–110)
Creat: 0.79 mg/dL (ref 0.60–0.93)
GFR, Est African American: 83 mL/min/{1.73_m2}
GFR, Est Non African American: 72 mL/min/{1.73_m2}
Globulin: 3 g/dL (ref 1.9–3.7)
Glucose, Bld: 203 mg/dL — ABNORMAL HIGH (ref 65–139)
Potassium: 4 mmol/L (ref 3.5–5.3)
Sodium: 139 mmol/L (ref 135–146)
Total Bilirubin: 0.3 mg/dL (ref 0.2–1.2)
Total Protein: 6.7 g/dL (ref 6.1–8.1)

## 2019-08-05 ENCOUNTER — Other Ambulatory Visit: Payer: Self-pay | Admitting: *Deleted

## 2019-08-05 MED ORDER — VICTOZA 18 MG/3ML ~~LOC~~ SOPN: PEN_INJECTOR | SUBCUTANEOUS | 5 refills | Status: DC

## 2019-08-05 NOTE — Telephone Encounter (Signed)
Patient called and left message requesting refill.

## 2019-08-05 NOTE — Progress Notes (Signed)
Glucose is elevated-203.  Rest of CMP WNL.  WBC count and RBC count are elevated.  Please notify patient and forward to PCP.

## 2019-08-07 ENCOUNTER — Encounter: Payer: Medicare Other | Admitting: Physical Medicine and Rehabilitation

## 2019-08-09 ENCOUNTER — Other Ambulatory Visit: Payer: Self-pay

## 2019-08-09 MED ORDER — TIZANIDINE HCL 2 MG PO TABS: ORAL_TABLET | ORAL | 1 refills | Status: DC

## 2019-08-09 MED ORDER — TIZANIDINE HCL 2 MG PO TABS: 2.0000 mg | ORAL_TABLET | Freq: Three times a day (TID) | ORAL | 1 refills | Status: DC

## 2019-08-14 ENCOUNTER — Other Ambulatory Visit: Payer: Medicare Other

## 2019-08-19 ENCOUNTER — Ambulatory Visit: Payer: Medicare Other | Admitting: Internal Medicine

## 2019-08-21 ENCOUNTER — Other Ambulatory Visit: Payer: Self-pay

## 2019-08-21 DIAGNOSIS — M5441 Lumbago with sciatica, right side: Secondary | ICD-10-CM

## 2019-08-21 DIAGNOSIS — G8929 Other chronic pain: Secondary | ICD-10-CM

## 2019-08-21 MED ORDER — OXYCODONE HCL 5 MG PO TABS: 5.0000 mg | ORAL_TABLET | Freq: Four times a day (QID) | ORAL | 0 refills | Status: DC | PRN

## 2019-08-21 NOTE — Telephone Encounter (Signed)
Patient called for refill of oxycodone 5 mg.  Pended Rx. Dr. Mariea Clonts out of the office this week. Forwarded to Dr. Lyndel Safe.

## 2019-08-22 ENCOUNTER — Other Ambulatory Visit: Payer: Self-pay

## 2019-08-22 ENCOUNTER — Emergency Department (HOSPITAL_COMMUNITY): Payer: Medicare Other

## 2019-08-22 ENCOUNTER — Encounter (HOSPITAL_COMMUNITY): Payer: Self-pay | Admitting: Emergency Medicine

## 2019-08-22 ENCOUNTER — Emergency Department (HOSPITAL_COMMUNITY)
Admission: EM | Admit: 2019-08-22 | Discharge: 2019-08-22 | Disposition: A | Discharge status: Home / Self Care | Payer: Medicare Other | Attending: Emergency Medicine | Admitting: Emergency Medicine

## 2019-08-22 DIAGNOSIS — I10 Essential (primary) hypertension: Secondary | ICD-10-CM | POA: Insufficient documentation

## 2019-08-22 DIAGNOSIS — E11621 Type 2 diabetes mellitus with foot ulcer: Secondary | ICD-10-CM | POA: Diagnosis not present

## 2019-08-22 DIAGNOSIS — L03116 Cellulitis of left lower limb: Secondary | ICD-10-CM

## 2019-08-22 DIAGNOSIS — E1142 Type 2 diabetes mellitus with diabetic polyneuropathy: Secondary | ICD-10-CM | POA: Diagnosis not present

## 2019-08-22 DIAGNOSIS — E039 Hypothyroidism, unspecified: Secondary | ICD-10-CM | POA: Diagnosis not present

## 2019-08-22 DIAGNOSIS — J449 Chronic obstructive pulmonary disease, unspecified: Secondary | ICD-10-CM | POA: Diagnosis not present

## 2019-08-22 DIAGNOSIS — M05731 Rheumatoid arthritis with rheumatoid factor of right wrist without organ or systems involvement: Secondary | ICD-10-CM | POA: Insufficient documentation

## 2019-08-22 DIAGNOSIS — Z794 Long term (current) use of insulin: Secondary | ICD-10-CM | POA: Diagnosis not present

## 2019-08-22 DIAGNOSIS — R2242 Localized swelling, mass and lump, left lower limb: Secondary | ICD-10-CM | POA: Diagnosis present

## 2019-08-22 DIAGNOSIS — L97522 Non-pressure chronic ulcer of other part of left foot with fat layer exposed: Secondary | ICD-10-CM | POA: Diagnosis not present

## 2019-08-22 DIAGNOSIS — M05732 Rheumatoid arthritis with rheumatoid factor of left wrist without organ or systems involvement: Secondary | ICD-10-CM | POA: Insufficient documentation

## 2019-08-22 DIAGNOSIS — Z79899 Other long term (current) drug therapy: Secondary | ICD-10-CM | POA: Diagnosis not present

## 2019-08-22 DIAGNOSIS — Z87891 Personal history of nicotine dependence: Secondary | ICD-10-CM | POA: Insufficient documentation

## 2019-08-22 MED ORDER — DOXYCYCLINE HYCLATE 100 MG PO TABS
100.0000 mg | ORAL_TABLET | Freq: Once | ORAL | Status: AC
  Administered 2019-08-22: 100 mg via ORAL
  Filled 2019-08-22: qty 1

## 2019-08-22 MED ORDER — DOXYCYCLINE HYCLATE 100 MG PO CAPS: 100.0000 mg | ORAL_CAPSULE | Freq: Two times a day (BID) | ORAL | 0 refills | Status: DC

## 2019-08-22 NOTE — ED Triage Notes (Signed)
Patient has noticed left foot and leg swelling for the past 3 days. She also complains of pain on the left upper leg. Foot and leg are warm and red. This area is also painful to touch. The patient also complains of headaches. She report a recent history of UTI and kidney infection.

## 2019-08-22 NOTE — ED Provider Notes (Signed)
[facility] DEPT Provider Note   CSN: [id number] Arrival date & time: 08/22/19  1355     History Chief Complaint  Patient presents with  . Leg Pain  . Foot Pain    Katherine Delgado is a 78 y.o. female.  78 yo F with a chief complaint of redness and swelling to the left lower extremity.  This has been going on for about 5 or 6 days.  She started with some pain to the left great toe.  Patient has had a diabetic ulcer that area had healed but reoccurred.  She has been applying dressing to the area.  Has noticed a little bit of drainage.  No fevers or chills.  The redness to the leg is actually improved a little bit over the past 48 hours.  Denies trauma to the area.  Has some abdominal pain that is chronic and unchanged.  The history is provided by the patient.  Leg Pain Location:  Leg Time since incident:  5 days Injury: no   Leg location:  L lower leg Pain details:    Quality:  Aching   Radiates to:  Does not radiate   Severity:  Moderate   Onset quality:  Gradual   Duration:  5 days   Timing:  Constant   Progression:  Partially resolved Chronicity:  New Prior injury to area:  No Relieved by:  Nothing Worsened by:  Nothing Ineffective treatments:  None tried Associated symptoms: no fever   Foot Pain Pertinent negatives include no chest pain, no headaches and no shortness of breath.       Past Medical History:  Diagnosis Date  . Abnormality of gait 04/19/2016  . Acute infective polyneuritis ([place]) 2002  . Allergic rhinitis due to pollen   . Chronic pain syndrome   . COPD (chronic obstructive pulmonary disease) (HCC)    chronic bronchitis  . Depressive disorder, not elsewhere classified   . Diabetes mellitus without complication ([place])   . Diaphragmatic hernia without mention of obstruction or gangrene   . Dyslipidemia   . Fibromyalgia   . GERD (gastroesophageal reflux disease)    with h/o esophagitis  . Guillain-Barre syndrome ([place])     . History of benign thymus tumor   . Insomnia, unspecified   . Lumbar spinal stenosis 03/30/2018   L4-5 level, severe  . Miscarriage 1962  . Mixed hyperlipidemia   . Morbid obesity ([place])   . Osteoporosis   . Pneumonia   . Polyneuropathy in diabetes(357.2)   . Rheumatoid arthritis(714.0)   . Spondylosis, lumbosacral   . Spontaneous ecchymoses   . Type II or unspecified type diabetes mellitus with peripheral circulatory disorders, uncontrolled(250.72)   . Unspecified chronic bronchitis ([place])   . Unspecified essential hypertension   . Unspecified hypothyroidism   . Unspecified pruritic disorder   . Unspecified urinary incontinence     Patient Active Problem List   Diagnosis Date Noted  . Primary osteoarthritis of right knee 07/25/2019  . Mild episode of recurrent major depressive disorder ([place]) 10/22/2018  . Wound dehiscence 08/17/2018  . Lumbar spinal stenosis 03/30/2018  . Rheumatoid arthritis involving both wrists with positive rheumatoid factor ([place]) 06/14/2016  . High risk medication use 06/14/2016  . Sjogren's syndrome ([place]) 06/14/2016  . Primary osteoarthritis of both knees 06/14/2016  . DDD (degenerative disc disease), lumbar 06/14/2016  . Hypothyroidism 06/14/2016  . Osteoporosis 06/14/2016  . Abnormality of gait 04/19/2016  . Type 2 diabetes mellitus with diabetic polyneuropathy,  with long-term current use of insulin ([place]) 11/27/2015  . Diabetes mellitus without complication ([place]) 123456  . Headache(784.0) 07/31/2013  . Sinus infection 07/31/2013  . Chronic right-sided low back pain with right-sided sciatica 03/14/2013  . Insomnia 12/06/2012  . Nausea alone 12/06/2012  . Diarrhea 12/06/2012  . Urinary incontinence, urge 12/06/2012  . Lumbosacral root lesions, not elsewhere classified 11/27/2012  . Diabetic polyneuropathy associated with type 2 diabetes mellitus ([place]) 11/27/2012  . EDEMA 05/04/2010  . YEAST INFECTION 05/03/2010  . Hyperlipidemia 05/03/2010   . DEPRESSION 05/03/2010  . History of peripheral neuropathy 05/03/2010  . Essential hypertension 05/03/2010  . ALLERGIC RHINITIS 05/03/2010  . PNEUMONIA 05/03/2010  . COPD (chronic obstructive pulmonary disease) ([place]) 05/03/2010  . GERD 05/03/2010  . Fibromyalgia 05/03/2010  . DYSPNEA 05/03/2010  . CHEST PAIN 05/03/2010    Past Surgical History:  Procedure Laterality Date  . ABDOMINAL HYSTERECTOMY  1974  . abdominal tumor  2002  . APPENDECTOMY    . APPLICATION OF A-CELL OF BACK N/A 09/26/2018   Procedure: With [place];  Surgeon: Wallace Going, DO;  Location: [facility];  Service: Plastics;  Laterality: N/A;  . APPLICATION OF WOUND VAC N/A 09/26/2018   Procedure: Vac placement;  Surgeon: Wallace Going, DO;  Location: [facility];  Service: Plastics;  Laterality: N/A;  . [place]  . BRONCHOSCOPY  2001  . CHOLECYSTECTOMY  1984  . INCISION AND DRAINAGE OF WOUND N/A 09/26/2018   Procedure: Debridement of spine wound;  Surgeon: Wallace Going, DO;  Location: [facility];  Service: Plastics;  Laterality: N/A;  . KNEE SURGERY Bilateral 08/27/2010 (L) and 01/11/2011 (R)  . LUMBAR LAMINECTOMY/DECOMPRESSION MICRODISCECTOMY N/A 07/03/2018   Procedure: Lumbar Three to Lumbar Five Laminectomy;  Surgeon: Judith Part, MD;  Location: [facility];  Service: Neurosurgery;  Laterality: N/A;  . LUMBAR WOUND DEBRIDEMENT N/A 08/20/2018   Procedure: POSTERIOR LUMBAR SPINAL WOUND DEBRIDEMENT AND REVISION;  Surgeon: Judith Part, MD;  Location: [facility];  Service: Neurosurgery;  Laterality: N/A;  POSTERIOR LUMBAR SPINAL WOUND REVISION  . [place]  . OVARIAN CYST SURGERY  1968  . thymus tumor    . 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 TONSILLECTOMY       OB History   No obstetric history on file.     Family History  Problem Relation Age of Onset  . Alzheimer's disease Mother   . Heart disease Mother   . Heart disease Father   . Liver disease Father   . Cancer Brother   . Arthritis Son    . Colon polyps Brother   . Colon cancer Neg Hx   . Esophageal cancer Neg Hx   . Kidney disease Neg Hx   . Stomach cancer Neg Hx   . Rectal cancer Neg Hx     Social History   Tobacco Use  . Smoking status: Former Smoker    Packs/day: 0.10    Years: 10.00    Pack years: 1.00    Types: Cigarettes    Quit date: 12/07/1979    Years since quitting: 39.7  . Smokeless tobacco: Never Used  Substance Use Topics  . Alcohol use: No    Alcohol/week: 0.0 standard drinks  . Drug use: No    Home Medications Prior to Admission medications   Medication Sig Start Date End Date Taking? Authorizing Provider  acetaminophen (TYLENOL) 325 MG tablet Take 650 mg by mouth every 6 (six) hours as needed (for  pain.).    [provider]  albuterol (PROVENTIL HFA;VENTOLIN HFA) 108 (90 Base) MCG/ACT inhaler Inhale 2 puffs into the lungs every 6 (six) hours as needed for wheezing or shortness of breath. 06/05/17   Reed, Tiffany L, DO  amLODipine (NORVASC) 10 MG tablet Take 1 tablet (10 mg total) by mouth daily. 07/23/19   Reed, Tiffany L, DO  aspirin EC 81 MG tablet Take 81 mg by mouth 2 (two) times a week.    [provider]  B-D UF III MINI PEN NEEDLES 31G X 5 MM MISC USE AS DIRECTED 04/03/19   Reed, Tiffany L, DO  Chlorphen-Phenyleph-ASA (ALKA-SELTZER PLUS COLD PO) Take by mouth as needed.    [provider]  cholecalciferol (VITAMIN D3) 25 MCG (1000 UT) tablet Take 1,000 Units by mouth daily.    [provider]  docusate sodium (COLACE) 100 MG capsule Take 100 mg by mouth daily as needed (constipation.).     [provider]  doxycycline (VIBRAMYCIN) 100 MG capsule Take 1 capsule (100 mg total) by mouth 2 (two) times daily. One po bid x 7 days 08/22/19   Deno Etienne, DO  estradiol (ESTRACE) 0.1 MG/GM vaginal cream Place 1 Applicatorful vaginally 3 (three) times a week. 01/21/19   Reed, Tiffany L, DO  folic acid (FOLVITE) 1 MG tablet Take 2 tablets (2 mg total) by mouth  daily. 05/22/19   Ngetich, Dinah C, NP  furosemide (LASIX) 20 MG tablet TAKE 1 TABLET BY MOUTH TWICE DAILY AS NEEDED FOR 3 POUND WEIGHT GAIN IN ONE DAY OR 5 POUNDS IN ONE WEEK 08/18/17   Reed, Tiffany L, DO  gabapentin (NEURONTIN) 600 MG tablet Take 1 tablet (600 mg total) by mouth 3 (three) times daily. 06/19/19   Reed, Tiffany L, DO  Insulin Lispro (HUMALOG KWIKPEN) 200 UNIT/ML SOPN Inject 20 Units into the skin 3 (three) times daily. 06/11/19   Reed, Tiffany L, DO  levothyroxine (SYNTHROID) 137 MCG tablet Take one tablet by mouth once daily 30 minutes before breakfast 06/14/19   Reed, Tiffany L, DO  liraglutide (VICTOZA) 18 MG/3ML SOPN ADMINISTER 1.8 MG UNDER THE SKIN DAILY FOR BLOOD SUGAR 08/05/19   Reed, Tiffany L, DO  lisinopril (ZESTRIL) 20 MG tablet TAKE 1 TABLET(20 MG) BY MOUTH DAILY 04/22/19   Reed, Tiffany L, DO  loperamide (IMODIUM A-D) 2 MG tablet Take 2 mg by mouth 4 (four) times daily as needed for diarrhea or loose stools.    [provider]  magnesium oxide (MAG-OX) 400 MG tablet Take 1 tablet (400 mg total) by mouth daily as needed (for leg cramps). 05/22/19   Ngetich, Dinah C, NP  Melatonin 10 MG TABS Take 10 mg by mouth at bedtime.    [provider]  metoprolol tartrate (LOPRESSOR) 50 MG tablet TAKE 1 TABLET(50 MG) BY MOUTH TWICE DAILY 04/09/19   Reed, Tiffany L, DO  Multiple Vitamins-Minerals (MULTIVITAMIN ADULT PO) Take 1 tablet by mouth daily.    [provider]  MYRBETRIQ 50 MG TB24 tablet TAKE 1 TABLET(50 MG) BY MOUTH DAILY 04/11/19   Reed, Tiffany L, DO  nystatin (NYSTATIN) powder APPLY TOPICALLY THREE TIMES DAILY AS DIRECTED FOR 14 DAYS 04/09/19   Ngetich, Dinah C, NP  nystatin cream (MYCOSTATIN) Apply 1 application topically 2 (two) times daily. To skin folds 03/22/19   Ngetich, Dinah C, NP  omega-3 acid ethyl esters (LOVAZA) 1 g capsule TAKE ONE CAPSULE BY MOUTH EVERY DAY 08/04/16   Reed, Tiffany L, DO  oxyCODONE (ROXICODONE) 5 MG immediate release  tablet Take 1 tablet (5 mg total) by mouth every 6 (six) hours as needed for severe pain. 08/21/19   Virgie Dad, MD  polyvinyl alcohol (LIQUIFILM TEARS) 1.4 % ophthalmic solution Place 1 drop into both eyes 4 (four) times daily as needed (for dry/irritated eyes).     [provider]  potassium chloride (MICRO-K) 10 MEQ CR capsule Take 2 capsules (20 mEq total) by mouth 2 (two) times daily as needed (when taking a dose of lasix.). 06/11/19   Reed, Tiffany L, DO  tiZANidine (ZANAFLEX) 2 MG tablet Take 1 tablet (2 mg total) by mouth 3 (three) times daily. 08/09/19   Reed, Tiffany L, DO  Tofacitinib Citrate (XELJANZ) 5 MG TABS Take 1 tablet (5 mg total) by mouth daily. 07/31/19   Bo Merino, MD  TOUJEO SOLOSTAR 300 UNIT/ML SOPN INJECT 100 UNITS INTO THE SKIN EVERY NIGHT AT BEDTIME 04/08/19   Reed, Tiffany L, DO  triamterene-hydrochlorothiazide (MAXZIDE-25) 37.5-25 MG tablet Take 1 tablet by mouth daily. 12/10/18   Reed, Tiffany L, DO  umeclidinium-vilanterol (ANORO ELLIPTA) 62.5-25 MCG/INH AEPB Inhale 1 puff into the lungs daily. 06/01/17   Reed, Tiffany L, DO  venlafaxine XR (EFFEXOR-XR) 75 MG 24 hr capsule TAKE 1 CAPSULE BY MOUTH EVERY DAY WITH BREAKFAST 04/11/19   Reed, Tiffany L, DO    Allergies    Influenza vaccines, Penicillins, Statins, and Sulfamethoxazole-trimethoprim  Review of Systems   Review of Systems  Constitutional: Negative for chills and fever.  HENT: Negative for congestion and rhinorrhea.   Eyes: Negative for redness and visual disturbance.  Respiratory: Negative for shortness of breath and wheezing.   Cardiovascular: Positive for leg swelling. Negative for chest pain and palpitations.  Gastrointestinal: Negative for nausea and vomiting.  Genitourinary: Negative for dysuria and urgency.  Musculoskeletal: Negative for arthralgias and myalgias.  Skin: Positive for wound. Negative for pallor.  Neurological: Negative for dizziness and headaches.    Physical  Exam Updated Vital Signs BP (!) 122/59 (BP Location: Right Arm)   Pulse 66   Temp 99.9 F (37.7 C) (Oral)   Resp 14   SpO2 96%   Physical Exam Vitals and nursing note reviewed.  Constitutional:      General: She is not in acute distress.    Appearance: She is well-developed. She is not diaphoretic.  HENT:     Head: Normocephalic and atraumatic.  Eyes:     Pupils: Pupils are equal, round, and reactive to light.  Cardiovascular:     Rate and Rhythm: Normal rate and regular rhythm.     Heart sounds: No murmur. No friction rub. No gallop.   Pulmonary:     Effort: Pulmonary effort is normal.     Breath sounds: No wheezing or rales.  Abdominal:     General: There is no distension.     Palpations: Abdomen is soft.     Tenderness: There is no abdominal tenderness.  Musculoskeletal:        General: Swelling present. No tenderness.     Cervical back: Normal range of motion and neck supple.     Comments: Swelling and erythema to the left lower extremity.  Extends up to about a third way up the calf.  Pulse motor and sensation are intact distally.  The patient has see wound to the plantar aspect of the left great toe.  I am able to express some purulent drainage.  There is no other areas of fluctuance  or induration.  Skin:    General: Skin is warm and dry.  Neurological:     Mental Status: She is alert and oriented to person, place, and time.  Psychiatric:        Behavior: Behavior normal.     ED Results / Procedures / Treatments   Labs (all labs ordered are listed, but only abnormal results are displayed) Labs Reviewed - No data to display  EKG None  Radiology DG Toe Great Left  Result Date: 08/22/2019 CLINICAL DATA:  First toe ulcer, initial encounter EXAM: LEFT GREAT TOE COMPARISON:  04/06/2018 FINDINGS: Degenerative changes of the interphalangeal joint are seen. The soft tissue wound is seen although no definitive bony erosive changes are seen to suggest osteomyelitis. No  other focal abnormality is seen. IMPRESSION: Soft tissue ulcer without definitive findings of osteomyelitis. Electronically Signed   By: Inez Catalina M.D.   On: 08/22/2019 15:55    Procedures Procedures (including critical care time)  Medications Ordered in ED Medications  doxycycline (VIBRA-TABS) tablet 100 mg (has no administration in time range)    ED Course  I have reviewed the triage vital signs and the nursing notes.  Pertinent labs & imaging results that were available during my care of the patient were reviewed by me and considered in my medical decision making (see chart for details).    MDM Rules/Calculators/A&P                      78 yo F with a chief complaints of left lower leg pain and swelling.  Clinically the patient has cellulitis.  Could be extension from a diabetic foot ulcer to the left great toe.  Patient with no systemic symptoms.  She is actually had some improvement of the erythema over the past 48 hours.  Discussed with her risks and benefits of admission.  Right now she is declining and would like to be started on antibiotics and will follow up with her podiatrist on Monday.  Strict return cautions were given.  Plain film of the left foot viewed by me without obvious osteomyelitis.  4:08 PM:  I have discussed the diagnosis/risks/treatment options with the patient and believe the pt to be eligible for discharge home to follow-up with PCP, Podiatry. We also discussed returning to the ED immediately if new or worsening sx occur. We discussed the sx which are most concerning (e.g., sudden worsening pain, fever, inability to tolerate by mouth, rapid spreading redness or systemic symptoms.) that necessitate immediate return. Medications administered to the patient during their visit and any new prescriptions provided to the patient are listed below.  Medications given during this visit Medications  doxycycline (VIBRA-TABS) tablet 100 mg (has no administration in time  range)     The patient appears reasonably screen and/or stabilized for discharge and I doubt any other medical condition or other [facility] requiring further screening, evaluation, or treatment in the ED at this time prior to discharge.   Final Clinical Impression(s) / ED Diagnoses Final diagnoses:  Cellulitis of left lower extremity  Diabetic ulcer of toe of left foot associated with type 2 diabetes mellitus, with fat layer exposed ([place])    Rx / DC Orders ED Discharge Orders         Ordered    doxycycline (VIBRAMYCIN) 100 MG capsule  2 times daily     08/22/19 [place], Detavious Rinn, [place] 08/22/19 1609

## 2019-08-22 NOTE — Discharge Instructions (Signed)
Return for rapid spreading redness or fever.  Take the antibiotics as prescribed.  Warm compresses or soaks at least 4 times a day.  Follow-up with your foot doctor.

## 2019-08-26 DIAGNOSIS — E119 Type 2 diabetes mellitus without complications: Secondary | ICD-10-CM | POA: Diagnosis not present

## 2019-08-27 ENCOUNTER — Other Ambulatory Visit: Payer: Self-pay | Admitting: Internal Medicine

## 2019-08-27 DIAGNOSIS — N3281 Overactive bladder: Secondary | ICD-10-CM

## 2019-08-28 ENCOUNTER — Ambulatory Visit (INDEPENDENT_AMBULATORY_CARE_PROVIDER_SITE_OTHER): Payer: Medicare Other | Admitting: Family

## 2019-08-28 ENCOUNTER — Other Ambulatory Visit: Payer: Self-pay

## 2019-08-28 ENCOUNTER — Encounter: Payer: Self-pay | Admitting: Family

## 2019-08-28 VITALS — BP 118/64 | HR 55 | Temp 97.5°F | Ht 66.0 in | Wt 222.2 lb

## 2019-08-28 DIAGNOSIS — L03116 Cellulitis of left lower limb: Secondary | ICD-10-CM | POA: Diagnosis not present

## 2019-08-28 DIAGNOSIS — L97529 Non-pressure chronic ulcer of other part of left foot with unspecified severity: Secondary | ICD-10-CM

## 2019-08-28 DIAGNOSIS — E11621 Type 2 diabetes mellitus with foot ulcer: Secondary | ICD-10-CM

## 2019-08-28 DIAGNOSIS — I739 Peripheral vascular disease, unspecified: Secondary | ICD-10-CM

## 2019-08-28 DIAGNOSIS — I83892 Varicose veins of left lower extremities with other complications: Secondary | ICD-10-CM | POA: Diagnosis not present

## 2019-08-28 MED ORDER — DOXYCYCLINE HYCLATE 100 MG PO CAPS: 100.0000 mg | ORAL_CAPSULE | Freq: Two times a day (BID) | ORAL | 0 refills | Status: DC

## 2019-08-28 MED ORDER — SACCHAROMYCES BOULARDII 250 MG PO CAPS: 250.0000 mg | ORAL_CAPSULE | Freq: Two times a day (BID) | ORAL | 0 refills | Status: AC

## 2019-08-28 NOTE — Patient Instructions (Signed)
-   Please get Vascular ultrasound done at imaging at [facility] in [place] provide's office if left leg cellulitis worsen or not resolved or running any fever > 100.5  - Take additional Doxycycline 100 mg tablet one by mouth twice daily x 7 days.Please take along with Over the counter Probiotics one by mouth twice daily.

## 2019-08-28 NOTE — Progress Notes (Addendum)
Provider: Halimah Bewick FNP-C  Gayland Curry, DO  Patient Care Team: Gayland Curry, DO as PCP - General (Geriatric Medicine) Bo Merino, MD (Rheumatology) Altheimer, Legrand Como, MD as Attending Physician (Endocrinology) Newt Minion, MD as Consulting Physician (Orthopedic Surgery)  Extended Emergency Contact Information Primary Emergency Contact: Marlana Latus, [place] Phone: [phone] Relation: Son Secondary Emergency Contact: Jonnie Finner Address: [address]          [place], [place] [address] [place] of [place] Phone: [phone] Relation: Son  Code Status: Full Code  Goals of care: Advanced Directive information Advanced Directives 01/11/2019  Does Patient Have a Medical Advance Directive? Yes  Type of Advance Directive Living will;Healthcare Power of Attorney  Does patient want to make changes to medical advance directive? -  Copy of [place] in Chart? -  Would patient like information on creating a medical advance directive? -  Pre-existing out of facility DNR order (yellow form or pink MOST form) -     Chief Complaint  Patient presents with  . Follow-up    Follow up on visit on cellulitis left lower extremities. Patient states it has gotten a lot better from what it was. States still red swollen and some pain.     HPI:  Pt is a 79 y.o. female seen today for an acute visit for evaluation of left leg cellulitis.she was seen in the ED 08/22/2019 for left leg cellulitis.she was prescribed 7 days course of Doxycycline.she states leg redness and swelling has improved but not resolved.Pain has also improved.she has two more days of antibiotics.she denies any fever or chills.   Past Medical History:  Diagnosis Date  . Abnormality of gait 04/19/2016  . Acute infective polyneuritis ([place]) 2002  . Allergic rhinitis due to pollen   . Chronic pain syndrome   . COPD (chronic  obstructive pulmonary disease) (HCC)    chronic bronchitis  . Depressive disorder, not elsewhere classified   . Diabetes mellitus without complication ([place])   . Diaphragmatic hernia without mention of obstruction or gangrene   . Dyslipidemia   . Fibromyalgia   . GERD (gastroesophageal reflux disease)    with h/o esophagitis  . Guillain-Barre syndrome ([place])   . History of benign thymus tumor   . Insomnia, unspecified   . Lumbar spinal stenosis 03/30/2018   L4-5 level, severe  . Miscarriage 1962  . Mixed hyperlipidemia   . Morbid obesity ([place])   . Osteoporosis   . Pneumonia   . Polyneuropathy in diabetes(357.2)   . Rheumatoid arthritis(714.0)   . Spondylosis, lumbosacral   . Spontaneous ecchymoses   . Type II or unspecified type diabetes mellitus with peripheral circulatory disorders, uncontrolled(250.72)   . Unspecified chronic bronchitis ([place])   . Unspecified essential hypertension   . Unspecified hypothyroidism   . Unspecified pruritic disorder   . Unspecified urinary incontinence    Past Surgical History:  Procedure Laterality Date  . ABDOMINAL HYSTERECTOMY  1974  . abdominal tumor  2002  . APPENDECTOMY    . APPLICATION OF A-CELL OF BACK N/A 09/26/2018   Procedure: With Acell;  Surgeon: Wallace Going, DO;  Location: [facility];  Service: Plastics;  Laterality: N/A;  . APPLICATION OF WOUND VAC N/A 09/26/2018   Procedure: Vac placement;  Surgeon: Wallace Going, DO;  Location: [facility];  Service: Plastics;  Laterality: N/A;  . [place]  .  BRONCHOSCOPY  2001  . CHOLECYSTECTOMY  1984  . INCISION AND DRAINAGE OF WOUND N/A 09/26/2018   Procedure: Debridement of spine wound;  Surgeon: Wallace Going, DO;  Location: [facility];  Service: Plastics;  Laterality: N/A;  . KNEE SURGERY Bilateral 08/27/2010 (L) and 01/11/2011 (R)  . LUMBAR LAMINECTOMY/DECOMPRESSION MICRODISCECTOMY N/A 07/03/2018   Procedure: Lumbar Three to Lumbar Five Laminectomy;  Surgeon: Judith Part, MD;  Location: [facility];  Service: Neurosurgery;  Laterality: N/A;  . LUMBAR WOUND DEBRIDEMENT N/A 08/20/2018   Procedure: POSTERIOR LUMBAR SPINAL WOUND DEBRIDEMENT AND REVISION;  Surgeon: Judith Part, MD;  Location: [facility];  Service: Neurosurgery;  Laterality: N/A;  POSTERIOR LUMBAR SPINAL WOUND REVISION  . [place]  . OVARIAN CYST SURGERY  1968  . thymus tumor    . 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 TONSILLECTOMY      Allergies  Allergen Reactions  . Influenza Vaccines Other (See Comments)    "h/o Guillain Barre; dr's told me years ago never to take another flu shot as it could relapse Rosalee Kaufman; has to do with when vaccine being changed to H1N1 virus"  . Penicillins Hives, Itching, Swelling and Rash    Has patient had a PCN reaction causing immediate rash, facial/tongue/throat swelling, SOB or lightheadedness with hypotension:Unknown Has patient had a PCN reaction causing severe rash involving mucus membranes or skin necrosis: Unknown Has patient had a PCN reaction that required hospitalization:Unknown Has patient had a PCN reaction occurring within the last 10 years: Unknown If all of the above answers are "NO", then may proceed with Cephalosporin use.   . Statins Other (See Comments)    Myopathy, transaminitis  . Sulfamethoxazole-Trimethoprim Itching    Outpatient Encounter Medications as of 08/28/2019  Medication Sig  . acetaminophen (TYLENOL) 325 MG tablet Take 650 mg by mouth every 6 (six) hours as needed (for pain.).  Marland Kitchen albuterol (PROVENTIL HFA;VENTOLIN HFA) 108 (90 Base) MCG/ACT inhaler Inhale 2 puffs into the lungs every 6 (six) hours as needed for wheezing or shortness of breath.  Marland Kitchen amLODipine (NORVASC) 10 MG tablet Take 1 tablet (10 mg total) by mouth daily.  Marland Kitchen aspirin EC 81 MG tablet Take 81 mg by mouth 2 (two) times a week.  . B-D UF III MINI PEN NEEDLES 31G X 5 MM MISC USE AS DIRECTED  . Chlorphen-Phenyleph-ASA (ALKA-SELTZER PLUS COLD PO) Take by mouth  as needed.  . cholecalciferol (VITAMIN D3) 25 MCG (1000 UT) tablet Take 1,000 Units by mouth daily.  Marland Kitchen docusate sodium (COLACE) 100 MG capsule Take 100 mg by mouth daily as needed (constipation.).   Marland Kitchen doxycycline (VIBRAMYCIN) 100 MG capsule Take 1 capsule (100 mg total) by mouth 2 (two) times daily. One po bid x 7 days  . estradiol (ESTRACE) 0.1 MG/GM vaginal cream Place 1 Applicatorful vaginally 3 (three) times a week.  . folic acid (FOLVITE) 1 MG tablet Take 2 tablets (2 mg total) by mouth daily.  . furosemide (LASIX) 20 MG tablet TAKE 1 TABLET BY MOUTH TWICE DAILY AS NEEDED FOR 3 POUND WEIGHT GAIN IN ONE DAY OR 5 POUNDS IN ONE WEEK  . gabapentin (NEURONTIN) 600 MG tablet Take 1 tablet (600 mg total) by mouth 3 (three) times daily.  . Insulin Lispro (HUMALOG KWIKPEN) 200 UNIT/ML SOPN Inject 20 Units into the skin 3 (three) times daily.  Marland Kitchen levothyroxine (SYNTHROID) 137 MCG tablet Take one tablet by mouth once daily 30 minutes before breakfast  . liraglutide (VICTOZA)  18 MG/3ML SOPN ADMINISTER 1.8 MG UNDER THE SKIN DAILY FOR BLOOD SUGAR  . lisinopril (ZESTRIL) 20 MG tablet TAKE 1 TABLET(20 MG) BY MOUTH DAILY  . loperamide (IMODIUM A-D) 2 MG tablet Take 2 mg by mouth 4 (four) times daily as needed for diarrhea or loose stools.  . magnesium oxide (MAG-OX) 400 MG tablet Take 1 tablet (400 mg total) by mouth daily as needed (for leg cramps).  . Melatonin 10 MG TABS Take 10 mg by mouth at bedtime.  . metoprolol tartrate (LOPRESSOR) 50 MG tablet TAKE 1 TABLET(50 MG) BY MOUTH TWICE DAILY  . Multiple Vitamins-Minerals (MULTIVITAMIN ADULT PO) Take 1 tablet by mouth daily.  Marland Kitchen MYRBETRIQ 50 MG TB24 tablet TAKE 1 TABLET(50 MG) BY MOUTH DAILY  . nystatin (NYSTATIN) powder APPLY TOPICALLY THREE TIMES DAILY AS DIRECTED FOR 14 DAYS  . nystatin cream (MYCOSTATIN) Apply 1 application topically 2 (two) times daily. To skin folds  . omega-3 acid ethyl esters (LOVAZA) 1 g capsule TAKE ONE CAPSULE BY MOUTH EVERY DAY    . oxyCODONE (ROXICODONE) 5 MG immediate release tablet Take 1 tablet (5 mg total) by mouth every 6 (six) hours as needed for severe pain.  . polyvinyl alcohol (LIQUIFILM TEARS) 1.4 % ophthalmic solution Place 1 drop into both eyes 4 (four) times daily as needed (for dry/irritated eyes).   . potassium chloride (MICRO-K) 10 MEQ CR capsule Take 2 capsules (20 mEq total) by mouth 2 (two) times daily as needed (when taking a dose of lasix.).  Marland Kitchen tiZANidine (ZANAFLEX) 2 MG tablet Take 1 tablet (2 mg total) by mouth 3 (three) times daily.  . Tofacitinib Citrate (XELJANZ) 5 MG TABS Take 1 tablet (5 mg total) by mouth daily.  . TOUJEO SOLOSTAR 300 UNIT/ML SOPN INJECT 100 UNITS INTO THE SKIN EVERY NIGHT AT BEDTIME  . triamterene-hydrochlorothiazide (MAXZIDE-25) 37.5-25 MG tablet TAKE 1 TABLET BY MOUTH DAILY  . umeclidinium-vilanterol (ANORO ELLIPTA) 62.5-25 MCG/INH AEPB Inhale 1 puff into the lungs daily.  Marland Kitchen venlafaxine XR (EFFEXOR-XR) 75 MG 24 hr capsule TAKE 1 CAPSULE BY MOUTH EVERY DAY WITH BREAKFAST   No facility-administered encounter medications on file as of 08/28/2019.    Review of Systems  Constitutional: Negative for appetite change, chills, fatigue and fever.  Respiratory: Negative for cough, chest tightness, shortness of breath and wheezing.   Cardiovascular: Positive for leg swelling. Negative for chest pain and palpitations.  Musculoskeletal: Positive for arthralgias and gait problem.  Skin: Positive for wound. Negative for color change, pallor and rash.  Neurological: Negative for dizziness, speech difficulty, weakness, light-headedness, numbness and headaches.    Immunization History  Administered Date(s) Administered  . Influenza-Unspecified 06/10/2011  . Pneumococcal Conjugate-13 07/07/2016  . Pneumococcal Polysaccharide-23 11/21/2003, 10/05/2017  . Tdap 05/28/2016   Pertinent  Health Maintenance Due  Topic Date Due  . OPHTHALMOLOGY EXAM  12/20/1950  . HEMOGLOBIN A1C   10/10/2019  . FOOT EXAM  04/17/2020  . DEXA SCAN  09/20/2025  . PNA vac Low Risk Adult  Completed  . INFLUENZA VACCINE  Discontinued   Fall Risk  08/28/2019 07/25/2019 04/18/2019 04/09/2019 03/22/2019  Falls in the past year? 0 1 0 0 0  Number falls in past yr: 0 0 0 0 0  Injury with Fall? 0 0 0 0 0  Risk for fall due to : - - - - -  Follow up - - - - -    Vitals:   08/28/19 1047  BP: 118/64  Pulse: (!) 55  Temp: (!) 97.5 F (36.4 C)  TempSrc: Temporal  SpO2: 97%  Weight: 222 lb 3.2 oz (100.8 kg)  Height: 5\' 6"  (1.676 m)   Body mass index is 35.86 kg/m. Physical Exam Vitals reviewed.  Constitutional:      General: She is not in acute distress.    Appearance: She is not ill-appearing.  HENT:     Head: Normocephalic.  Eyes:     General: No scleral icterus.       Right eye: No discharge.        Left eye: No discharge.     Extraocular Movements: Extraocular movements intact.     Conjunctiva/sclera: Conjunctivae normal.     Pupils: Pupils are equal, round, and reactive to light.  Neck:     Vascular: No carotid bruit.  Cardiovascular:     Rate and Rhythm: Normal rate and regular rhythm.     Heart sounds: No murmur. No friction rub. No gallop.      Comments: Pedal pulse diminished  Pulmonary:     Effort: Pulmonary effort is normal. No respiratory distress.     Breath sounds: Normal breath sounds. No wheezing, rhonchi or rales.  Chest:     Chest wall: No tenderness.  Abdominal:     General: Bowel sounds are normal. There is no distension.     Palpations: Abdomen is soft. There is no mass.     Tenderness: There is no abdominal tenderness. There is no right CVA tenderness, left CVA tenderness, guarding or rebound.  Musculoskeletal:        General: No swelling or tenderness.     Cervical back: Normal range of motion. No rigidity or tenderness.     Right lower leg: No edema.     Comments: Unsteady gait.Left leg trace edema.Non tender to palpate.   Lymphadenopathy:      Cervical: No cervical adenopathy.  Skin:    General: Skin is warm and dry.     Coloration: Skin is not pale.     Findings: No bruising or rash.     Comments: Left leg skin erythema slight warm to touch. Left great toe shallow ulcer wound bed brownish due to betadine used to cleanse wound by patient.None-tender to touch.   Neurological:     Mental Status: She is alert and oriented to person, place, and time.     Cranial Nerves: No cranial nerve deficit.     Sensory: No sensory deficit.     Motor: No weakness.     Coordination: Coordination normal.     Gait: Gait abnormal.  Psychiatric:        Mood and Affect: Mood normal.        Behavior: Behavior normal.        Thought Content: Thought content normal.        Judgment: Judgment normal.    Labs reviewed: Recent Labs    04/02/19 1219 04/09/19 0820 08/02/19 1514  NA 140 141 139  K 5.3 5.3 4.0  CL 103 101 99  CO2 29 31 27   GLUCOSE 171* 220* 203*  BUN 31* 19 14  CREATININE 1.15* 0.88 0.79  CALCIUM 9.8 9.8 9.5   Recent Labs    04/02/19 1219 04/09/19 0820 08/02/19 1514  AST 16 13 10   ALT 12 12 7   BILITOT 0.3 0.4 0.3  PROT 6.7  6.9 6.2 6.7   Recent Labs    04/02/19 1219 04/09/19 0820 08/02/19 1514  WBC 14.1* 13.1* 15.5*  NEUTROABS 10,321*  9,484* 12,028*  HGB 13.5 13.0 14.0  HCT 41.5 40.5 43.1  MCV 83.3 85.4 83.0  PLT 343 351 350   Lab Results  Component Value Date   TSH 3.75 12/07/2018   Lab Results  Component Value Date   HGBA1C 8.6 (H) 04/09/2019   Lab Results  Component Value Date   CHOL 142 12/07/2018   HDL 38 (L) 12/07/2018   LDLCALC 83 12/07/2018   TRIG 118 12/07/2018   CHOLHDL 3.7 12/07/2018    Significant Diagnostic Results in last 30 days:  DG Toe Great Left  Result Date: 08/22/2019 CLINICAL DATA:  First toe ulcer, initial encounter EXAM: LEFT GREAT TOE COMPARISON:  04/06/2018 FINDINGS: Degenerative changes of the interphalangeal joint are seen. The soft tissue wound is seen although no  definitive bony erosive changes are seen to suggest osteomyelitis. No other focal abnormality is seen. IMPRESSION: Soft tissue ulcer without definitive findings of osteomyelitis. Electronically Signed   By: Inez Catalina M.D.   On: 08/22/2019 15:55    Assessment/Plan 1. Left leg cellulitis Afebrile.Left leg skin erythema slight warm to touch. Diminished pedal pulse.Will treat with addition course of doxycycline. - VAS [place] ABI WITH/WO TBI; Future - saccharomyces boulardii (FLORASTOR) 250 MG capsule; Take 1 capsule (250 mg total) by mouth 2 (two) times daily for 10 days.  Dispense: 20 capsule; Refill: 0 - doxycycline (VIBRAMYCIN) 100 MG capsule; Take 1 capsule (100 mg total) by mouth 2 (two) times daily. One po bid x 7 days  Dispense: 14 capsule; Refill: 0  2. Diabetic ulcer of left great toe ([place]) Wound bed without any signs of infection.No drainage noted betadine noted on wound bed. - VAS [place] ABI WITH/WO TBI; Future - doxycycline (VIBRAMYCIN) 100 MG capsule; Take 1 capsule (100 mg total) by mouth 2 (two) times daily. One po bid x 7 days  Dispense: 14 capsule; Refill: 0 - Ambulatory referral to Podiatry  3. Varicose veins of left leg with edema Non-tender to palpation.continue on ASA and Furosemide  - VAS [place] ABI WITH/WO TBI; Future  Family/ staff Communication: Reviewed plan of care with patient.  Labs/tests ordered: - VAS [place] ABI WITH/WO TBI; Future  Addendum: 08/29/2019  Lower extremities Doppler studies indicates abnormal right toe-brachial index and Resting left ankle-brachial index  moderate left lower extremity arterial disease.will refer to vein and vascular specialist for further evaluation.   Sandrea Hughs, NP

## 2019-08-29 ENCOUNTER — Other Ambulatory Visit: Payer: Self-pay

## 2019-08-29 ENCOUNTER — Ambulatory Visit (HOSPITAL_COMMUNITY)
Admission: RE | Admit: 2019-08-29 | Discharge: 2019-08-29 | Disposition: A | Discharge status: Home / Self Care | Payer: Medicare Other | Source: Ambulatory Visit | Attending: Family | Admitting: Family

## 2019-08-29 DIAGNOSIS — I83892 Varicose veins of left lower extremities with other complications: Secondary | ICD-10-CM

## 2019-08-29 DIAGNOSIS — L97529 Non-pressure chronic ulcer of other part of left foot with unspecified severity: Secondary | ICD-10-CM | POA: Diagnosis not present

## 2019-08-29 DIAGNOSIS — L03116 Cellulitis of left lower limb: Secondary | ICD-10-CM | POA: Diagnosis not present

## 2019-08-29 DIAGNOSIS — E11621 Type 2 diabetes mellitus with foot ulcer: Secondary | ICD-10-CM | POA: Diagnosis not present

## 2019-08-29 NOTE — Addendum Note (Signed)
Addended byMarlowe Sax C on: 08/29/2019 04:53 PM   Modules accepted: Orders

## 2019-08-30 ENCOUNTER — Telehealth: Payer: Self-pay | Admitting: *Deleted

## 2019-08-30 ENCOUNTER — Other Ambulatory Visit: Payer: Self-pay | Admitting: Internal Medicine

## 2019-08-30 NOTE — Telephone Encounter (Signed)
Received Prior Authorization request for Victoza. Placed Prior Auth in Dr. Cyndi Lennert folder to review, fill out and sign. To be faxed back to [facility] once completed [phone]  Patient ID: [id number]

## 2019-09-02 ENCOUNTER — Other Ambulatory Visit: Payer: Medicare Other

## 2019-09-02 ENCOUNTER — Other Ambulatory Visit: Payer: Self-pay

## 2019-09-02 ENCOUNTER — Telehealth (HOSPITAL_COMMUNITY): Payer: Self-pay

## 2019-09-02 DIAGNOSIS — E1169 Type 2 diabetes mellitus with other specified complication: Secondary | ICD-10-CM | POA: Diagnosis not present

## 2019-09-02 DIAGNOSIS — Z794 Long term (current) use of insulin: Secondary | ICD-10-CM | POA: Diagnosis not present

## 2019-09-02 DIAGNOSIS — E1142 Type 2 diabetes mellitus with diabetic polyneuropathy: Secondary | ICD-10-CM | POA: Diagnosis not present

## 2019-09-02 DIAGNOSIS — E785 Hyperlipidemia, unspecified: Secondary | ICD-10-CM | POA: Diagnosis not present

## 2019-09-02 LAB — CBC WITH DIFFERENTIAL/PLATELET
Absolute Monocytes: 1051 cells/uL — ABNORMAL HIGH (ref 200–950)
Basophils Absolute: 163 cells/uL (ref 0–200)
Basophils Relative: 1.1 %
Eosinophils Absolute: 385 cells/uL (ref 15–500)
Eosinophils Relative: 2.6 %
HCT: 42.6 % (ref 35.0–45.0)
Hemoglobin: 13.9 g/dL (ref 11.7–15.5)
Lymphs Abs: 2753 cells/uL (ref 850–3900)
MCH: 27.1 pg (ref 27.0–33.0)
MCHC: 32.6 g/dL (ref 32.0–36.0)
MCV: 83 fL (ref 80.0–100.0)
MPV: 10.6 fL (ref 7.5–12.5)
Monocytes Relative: 7.1 %
Neutro Abs: 10449 cells/uL — ABNORMAL HIGH (ref 1500–7800)
Neutrophils Relative %: 70.6 %
Platelets: 373 10*3/uL (ref 140–400)
RBC: 5.13 10*6/uL — ABNORMAL HIGH (ref 3.80–5.10)
RDW: 12.9 % (ref 11.0–15.0)
Total Lymphocyte: 18.6 %
WBC: 14.8 10*3/uL — ABNORMAL HIGH (ref 3.8–10.8)

## 2019-09-02 LAB — BASIC METABOLIC PANEL
BUN/Creatinine Ratio: 25 (calc) — ABNORMAL HIGH (ref 6–22)
BUN: 24 mg/dL (ref 7–25)
CO2: 31 mmol/L (ref 20–32)
Calcium: 9.7 mg/dL (ref 8.6–10.4)
Chloride: 103 mmol/L (ref 98–110)
Creat: 0.97 mg/dL — ABNORMAL HIGH (ref 0.60–0.93)
Glucose, Bld: 122 mg/dL — ABNORMAL HIGH (ref 65–99)
Potassium: 4.6 mmol/L (ref 3.5–5.3)
Sodium: 141 mmol/L (ref 135–146)

## 2019-09-02 LAB — LIPID PANEL
Cholesterol: 150 mg/dL (ref <200)
HDL: 39 mg/dL — ABNORMAL LOW (ref >=50)
LDL Cholesterol (Calc): 87 mg/dL (calc)
Non-HDL Cholesterol (Calc): 111 mg/dL (calc) (ref <130)
Total CHOL/HDL Ratio: 3.8 (calc) (ref <5.0)
Triglycerides: 139 mg/dL (ref <150)

## 2019-09-02 NOTE — Telephone Encounter (Signed)

## 2019-09-03 ENCOUNTER — Ambulatory Visit (INDEPENDENT_AMBULATORY_CARE_PROVIDER_SITE_OTHER): Payer: Medicare Other | Admitting: Vascular Surgery

## 2019-09-03 ENCOUNTER — Encounter: Payer: Self-pay | Admitting: Vascular Surgery

## 2019-09-03 VITALS — BP 154/60 | HR 70 | Temp 97.3°F | Resp 16 | Ht 66.0 in | Wt 215.0 lb

## 2019-09-03 DIAGNOSIS — L03116 Cellulitis of left lower limb: Secondary | ICD-10-CM

## 2019-09-03 NOTE — Progress Notes (Signed)
Labs suggest a bit of dehydration.  White blood cell remains chronically elevated.  Cholesterol above goal, but close as it's been for a long time

## 2019-09-03 NOTE — Progress Notes (Signed)
[facility] [place]  Patient name: Katherine Delgado MRN: [id number] DOB: 1941-07-21 Sex: female  REASON FOR CONSULT: Evaluation cellulitis left leg.  HPI: Katherine Delgado is a 79 y.o. female, who is here today for evaluation of cellulitis left leg.  She reports that she had had a flareup of this and is now on her second 7-day course of doxycycline.  Does not recall any specific inciting event.  She does have a long history of callus on the plantar aspect of her great toe with a slight ulceration in the midportion of this but no real new inflammation of this area.  She underwent noninvasive studies on 08/29/2019 showing a slight decrease on the left and is here for further discussion.  She has no history of rest pain or claudication.  She does have a long history of diabetes.  Past Medical History:  Diagnosis Date  . Abnormality of gait 04/19/2016  . Acute infective polyneuritis ([place]) 2002  . Allergic rhinitis due to pollen   . Chronic pain syndrome   . COPD (chronic obstructive pulmonary disease) (HCC)    chronic bronchitis  . Depressive disorder, not elsewhere classified   . Diabetes mellitus without complication ([place])   . Diaphragmatic hernia without mention of obstruction or gangrene   . Dyslipidemia   . Fibromyalgia   . GERD (gastroesophageal reflux disease)    with h/o esophagitis  . Guillain-Barre syndrome ([place])   . History of benign thymus tumor   . Insomnia, unspecified   . Lumbar spinal stenosis 03/30/2018   L4-5 level, severe  . Miscarriage 1962  . Mixed hyperlipidemia   . Morbid obesity ([place])   . Osteoporosis   . Pneumonia   . Polyneuropathy in diabetes(357.2)   . Rheumatoid arthritis(714.0)   . Spondylosis, lumbosacral   . Spontaneous ecchymoses   . Type II or unspecified type diabetes mellitus with peripheral circulatory disorders, uncontrolled(250.72)   . Unspecified chronic bronchitis ([place])   . Unspecified  essential hypertension   . Unspecified hypothyroidism   . Unspecified pruritic disorder   . Unspecified urinary incontinence     Family History  Problem Relation Age of Onset  . Alzheimer's disease Mother   . Heart disease Mother   . Heart disease Father   . Liver disease Father   . Cancer Brother   . Arthritis Son   . Colon polyps Brother   . Colon cancer Neg Hx   . Esophageal cancer Neg Hx   . Kidney disease Neg Hx   . Stomach cancer Neg Hx   . Rectal cancer Neg Hx     SOCIAL HISTORY: Social History   Socioeconomic History  . Marital status: Widowed    Spouse name: Not on file  . Number of children: 2  . Years of education: 64  . Highest education level: Not on file  Occupational History  . Occupation: Retired  Tobacco Use  . Smoking status: Former Smoker    Packs/day: 0.10    Years: 10.00    Pack years: 1.00    Types: Cigarettes    Quit date: 12/07/1979    Years since quitting: 39.7  . Smokeless tobacco: Never Used  Substance and Sexual Activity  . Alcohol use: No    Alcohol/week: 0.0 standard drinks  . Drug use: No  . Sexual activity: Never  Other Topics Concern  . Not on file  Social History Narrative   Walks with cane   Right handed  Caffeine use: Coffee (2 cups every morning)   Tea: sometimes   Soda: none   Social Determinants of Radio broadcast assistant Strain:   . Difficulty of Paying Living Expenses: Not on file  Food Insecurity:   . Worried About Charity fundraiser in the Last Year: Not on file  . Ran Out of Food in the Last Year: Not on file  Transportation Needs:   . Lack of Transportation (Medical): Not on file  . Lack of Transportation (Non-Medical): Not on file  Physical Activity:   . Days of Exercise per Week: Not on file  . Minutes of Exercise per Session: Not on file  Stress:   . Feeling of Stress : Not on file  Social Connections:   . Frequency of Communication with Friends and Family: Not on file  . Frequency of Social  Gatherings with Friends and Family: Not on file  . Attends Religious Services: Not on file  . Active Member of Clubs or Organizations: Not on file  . Attends Archivist Meetings: Not on file  . Marital Status: Not on file  Intimate Partner Violence:   . Fear of Current or Ex-Partner: Not on file  . Emotionally Abused: Not on file  . Physically Abused: Not on file  . Sexually Abused: Not on file    Allergies  Allergen Reactions  . Influenza Vaccines Other (See Comments)    "h/o Guillain Barre; dr's told me years ago never to take another flu shot as it could relapse Rosalee Kaufman; has to do with when vaccine being changed to H1N1 virus"  . Penicillins Hives, Itching, Swelling and Rash    Has patient had a PCN reaction causing immediate rash, facial/tongue/throat swelling, SOB or lightheadedness with hypotension:Unknown Has patient had a PCN reaction causing severe rash involving mucus membranes or skin necrosis: Unknown Has patient had a PCN reaction that required hospitalization:Unknown Has patient had a PCN reaction occurring within the last 10 years: Unknown If all of the above answers are "NO", then may proceed with Cephalosporin use.   . Statins Other (See Comments)    Myopathy, transaminitis  . Sulfamethoxazole-Trimethoprim Itching    Current Outpatient Medications  Medication Sig Dispense Refill  . acetaminophen (TYLENOL) 325 MG tablet Take 650 mg by mouth every 6 (six) hours as needed (for pain.).    Marland Kitchen albuterol (PROVENTIL HFA;VENTOLIN HFA) 108 (90 Base) MCG/ACT inhaler Inhale 2 puffs into the lungs every 6 (six) hours as needed for wheezing or shortness of breath. 3 Inhaler 3  . amLODipine (NORVASC) 10 MG tablet Take 1 tablet (10 mg total) by mouth daily. 90 tablet 1  . aspirin EC 81 MG tablet Take 81 mg by mouth 2 (two) times a week.    . B-D UF III MINI PEN NEEDLES 31G X 5 MM MISC USE AS DIRECTED 100 each 0  . Chlorphen-Phenyleph-ASA (ALKA-SELTZER PLUS COLD PO)  Take by mouth as needed.    . cholecalciferol (VITAMIN D3) 25 MCG (1000 UT) tablet Take 1,000 Units by mouth daily.    Marland Kitchen docusate sodium (COLACE) 100 MG capsule Take 100 mg by mouth daily as needed (constipation.).     Marland Kitchen doxycycline (VIBRAMYCIN) 100 MG capsule Take 1 capsule (100 mg total) by mouth 2 (two) times daily. One po bid x 7 days 14 capsule 0  . estradiol (ESTRACE) 0.1 MG/GM vaginal cream Place 1 Applicatorful vaginally 3 (three) times a week. 123XX123 g 1  . folic acid (FOLVITE)  1 MG tablet Take 2 tablets (2 mg total) by mouth daily. 180 tablet 0  . furosemide (LASIX) 20 MG tablet TAKE 1 TABLET BY MOUTH TWICE DAILY AS NEEDED FOR 3 POUND WEIGHT GAIN IN ONE DAY OR 5 POUNDS IN ONE WEEK 60 tablet 0  . gabapentin (NEURONTIN) 600 MG tablet Take 1 tablet (600 mg total) by mouth 3 (three) times daily. 90 tablet 1  . Insulin Lispro (HUMALOG KWIKPEN) 200 UNIT/ML SOPN Inject 20 Units into the skin 3 (three) times daily. 40 pen 0  . levothyroxine (SYNTHROID) 137 MCG tablet Take one tablet by mouth once daily 30 minutes before breakfast 90 tablet 0  . lisinopril (ZESTRIL) 20 MG tablet TAKE 1 TABLET(20 MG) BY MOUTH DAILY 90 tablet 1  . loperamide (IMODIUM A-D) 2 MG tablet Take 2 mg by mouth 4 (four) times daily as needed for diarrhea or loose stools.    . magnesium oxide (MAG-OX) 400 MG tablet Take 1 tablet (400 mg total) by mouth daily as needed (for leg cramps). 30 tablet 0  . Melatonin 10 MG TABS Take 10 mg by mouth at bedtime.    . metoprolol tartrate (LOPRESSOR) 50 MG tablet TAKE 1 TABLET(50 MG) BY MOUTH TWICE DAILY 180 tablet 3  . Multiple Vitamins-Minerals (MULTIVITAMIN ADULT PO) Take 1 tablet by mouth daily.    Marland Kitchen MYRBETRIQ 50 MG TB24 tablet TAKE 1 TABLET(50 MG) BY MOUTH DAILY 90 tablet 1  . nystatin (NYSTATIN) powder APPLY TOPICALLY THREE TIMES DAILY AS DIRECTED FOR 14 DAYS 60 g 0  . nystatin cream (MYCOSTATIN) Apply 1 application topically 2 (two) times daily. To skin folds 30 g 3  . omega-3 acid  ethyl esters (LOVAZA) 1 g capsule TAKE ONE CAPSULE BY MOUTH EVERY DAY 30 capsule 0  . oxyCODONE (ROXICODONE) 5 MG immediate release tablet Take 1 tablet (5 mg total) by mouth every 6 (six) hours as needed for severe pain. 90 tablet 0  . polyvinyl alcohol (LIQUIFILM TEARS) 1.4 % ophthalmic solution Place 1 drop into both eyes 4 (four) times daily as needed (for dry/irritated eyes).     . potassium chloride (MICRO-K) 10 MEQ CR capsule Take 2 capsules (20 mEq total) by mouth 2 (two) times daily as needed (when taking a dose of lasix.). 60 capsule 0  . saccharomyces boulardii (FLORASTOR) 250 MG capsule Take 1 capsule (250 mg total) by mouth 2 (two) times daily for 10 days. 20 capsule 0  . tiZANidine (ZANAFLEX) 2 MG tablet Take 1 tablet (2 mg total) by mouth 3 (three) times daily. 90 tablet 1  . Tofacitinib Citrate (XELJANZ) 5 MG TABS Take 1 tablet (5 mg total) by mouth daily. 60 tablet 0  . TOUJEO SOLOSTAR 300 UNIT/ML SOPN INJECT 100 UNITS INTO THE SKIN EVERY NIGHT AT BEDTIME 81 mL 0  . triamterene-hydrochlorothiazide (MAXZIDE-25) 37.5-25 MG tablet TAKE 1 TABLET BY MOUTH DAILY 90 tablet 1  . umeclidinium-vilanterol (ANORO ELLIPTA) 62.5-25 MCG/INH AEPB Inhale 1 puff into the lungs daily. 60 each 5  . venlafaxine XR (EFFEXOR-XR) 75 MG 24 hr capsule TAKE 1 CAPSULE BY MOUTH EVERY DAY WITH BREAKFAST 90 capsule 1  . VICTOZA 18 MG/3ML SOPN ADMINISTER 1.8 MG UNDER THE SKIN DAILY FOR BLOOD SUGAR 9 mL 5   No current facility-administered medications for this visit.    REVIEW OF SYSTEMS:  [X]  denotes positive finding, [ ]  denotes negative finding Cardiac  Comments:  Chest pain or chest pressure:    Shortness of breath upon exertion:  Short of breath when lying flat:    Irregular heart rhythm:        Vascular    Pain in calf, thigh, or hip brought on by ambulation: x   Pain in feet at night that wakes you up from your sleep:  x   Blood clot in your veins:    Leg swelling:  x       Pulmonary      Oxygen at home:    Productive cough:     Wheezing:         Neurologic    Sudden weakness in arms or legs:  x   Sudden numbness in arms or legs:     Sudden onset of difficulty speaking or slurred speech:    Temporary loss of vision in one eye:     Problems with dizziness:         Gastrointestinal    Blood in stool:     Vomited blood:         Genitourinary    Burning when urinating:  x   Blood in urine:        Psychiatric    Major depression:         Hematologic    Bleeding problems:    Problems with blood clotting too easily:        Skin    Rashes or ulcers:        Constitutional    Fever or chills:      PHYSICAL EXAM: Vitals:   09/03/19 1500  BP: (!) 154/60  Pulse: 70  Resp: 16  Temp: (!) 97.3 F (36.3 C)  TempSrc: Temporal  SpO2: (!) 70%  Weight: 215 lb (97.5 kg)  Height: 5\' 6"  (1.676 m)    GENERAL: The patient is a well-nourished female, in no acute distress. The vital signs are documented above. CARDIOVASCULAR: 2+ radial pulses bilaterally.  Easily palpable right dorsalis pedis pulse.  She does have some swelling and I am unable to feel a pulse in her left foot.  She has a easily palpable 2+ left popliteal pulse. PULMONARY: There is good air exchange  ABDOMEN: Soft and non-tender  MUSCULOSKELETAL: There are no major deformities or cyanosis. NEUROLOGIC: No focal weakness or paresthesias are detected. SKIN: There are no ulcers or rashes noted.  Does have erythema and cellulitis from mid calf down to her ankle.  She does not have any evidence of infection in her foot specifically.  Does have callus formation and central ulcer on the plantar aspect of her great toe PSYCHIATRIC: The patient has a normal affect.  DATA:  Noninvasive studies from 08/29/2019 reveal ankle arm index of 0.78 and normal at 1 on the right  MEDICAL ISSUES: Had long discussion with the patient.  She may have some tibial disease on the left.  I did listen to her flow with hand-held  Doppler.  She has excellent signal at the peroneal, dorsalis pedis and posterior tibial at the ankle.  I do not feel that she has any issues regarding peripheral vascular disease that would keep her from healing her cellulitis.  Not exactly sure of the cause of this.  Would continue with antibiotics as indicated.  If she continues to have difficulty with her toe callus, would consider podiatry evaluation as well   Rosetta Posner, MD [facility] Tel [phone] Pager [phone]

## 2019-09-05 ENCOUNTER — Ambulatory Visit: Payer: Medicare Other | Admitting: Internal Medicine

## 2019-09-11 ENCOUNTER — Other Ambulatory Visit: Payer: Self-pay

## 2019-09-11 ENCOUNTER — Ambulatory Visit (INDEPENDENT_AMBULATORY_CARE_PROVIDER_SITE_OTHER): Payer: Medicare Other | Admitting: Family

## 2019-09-11 ENCOUNTER — Encounter: Payer: Self-pay | Admitting: Family

## 2019-09-11 VITALS — BP 122/60 | HR 64 | Temp 97.7°F | Ht 66.0 in | Wt 215.0 lb

## 2019-09-11 DIAGNOSIS — R399 Unspecified symptoms and signs involving the genitourinary system: Secondary | ICD-10-CM | POA: Diagnosis not present

## 2019-09-11 DIAGNOSIS — M25472 Effusion, left ankle: Secondary | ICD-10-CM | POA: Diagnosis not present

## 2019-09-11 DIAGNOSIS — M25572 Pain in left ankle and joints of left foot: Secondary | ICD-10-CM

## 2019-09-11 LAB — POCT URINALYSIS DIPSTICK
Bilirubin, UA: NEGATIVE
Blood, UA: NEGATIVE
Glucose, UA: NEGATIVE
Ketones, UA: NEGATIVE
Leukocytes, UA: NEGATIVE
Nitrite, UA: NEGATIVE
Protein, UA: NEGATIVE
Spec Grav, UA: 1.01 (ref 1.010–1.025)
Urobilinogen, UA: 0.2 E.U./dL
pH, UA: 6 (ref 5.0–8.0)

## 2019-09-11 NOTE — Patient Instructions (Signed)
-   Please Get left foot/ankle X-ray at [place] over [place] at [place] provider if left ankle redness recurs - Follow up with podiatrist as directed

## 2019-09-11 NOTE — Progress Notes (Signed)
Provider: Teigen Bellin FNP-C  Gayland Curry, DO  Patient Care Team: Gayland Curry, DO as PCP - General (Geriatric Medicine) Bo Merino, MD (Rheumatology) Altheimer, Legrand Como, MD as Attending Physician (Endocrinology) Newt Minion, MD as Consulting Physician (Orthopedic Surgery)  Extended Emergency Contact Information Primary Emergency Contact: Marlana Latus, [place] Phone: [phone] Relation: Son Secondary Emergency Contact: Jonnie Finner Address: [address]          [place], [place] [address] Johnnette Litter of [place] Phone: [phone] Relation: Son  Code Status: Full Code  Goals of care: Advanced Directive information Advanced Directives 01/11/2019  Does Patient Have a Medical Advance Directive? Yes  Type of Advance Directive Living will;Healthcare Power of Attorney  Does patient want to make changes to medical advance directive? -  Copy of [place] in Chart? -  Would patient like information on creating a medical advance directive? -  Pre-existing out of facility DNR order (yellow form or pink MOST form) -     Chief Complaint  Patient presents with  . Medical Management of Chronic Issues    2 week follow-up    HPI:  Pt is a 79 y.o. female seen today for an acute visit for 2 weeks follow up of left leg cellulitis. She states redness has improved but still has some swelling, pain on the left ankle and lateral foot..she has completed doxycycline 14 days.she denies any fever,chills.Her ABI's done 08/29/2019 right resting ankle-brachial showed no evidence of arterial disease but resting left ankle-brachial indicated moderate left lower extremity arterial disease.she was referred to vein and Vascular specialist.she was seen by Yolanda Manges 09/03/2019 noted no rest pain or claudication.hand-held doppler was done at the specialist noted excellent signal at the peroneal,dorsalis and  posterior tibial at the ankle but may have some tibial disease on the left.Per Dr.Todd visit note does not think patient has issues with peripheral vascular disease.unclear causes of cellulitis.recommeded for patient to complete antibiotics and follow up with podiatry for right toe callus. Patient states feet continues to feel cold and turns red-bluish color when she places foot on the floor but becomes pale-whitish when she elevates leg. I referred her to a podiatrist on her previous visit but states has to call and make an appointment with podiatry.   Also complains of urine Frequency,burning and urgency.States cranberry tablet previous recommended due to her frequent UTI's but has not take it. She denies any fever,chills,lower abdominal pain,N/V or back pain.     Past Medical History:  Diagnosis Date  . Abnormality of gait 04/19/2016  . Acute infective polyneuritis ([place]) 2002  . Allergic rhinitis due to pollen   . Chronic pain syndrome   . COPD (chronic obstructive pulmonary disease) (HCC)    chronic bronchitis  . Depressive disorder, not elsewhere classified   . Diabetes mellitus without complication ([place])   . Diaphragmatic hernia without mention of obstruction or gangrene   . Dyslipidemia   . Fibromyalgia   . GERD (gastroesophageal reflux disease)    with h/o esophagitis  . Guillain-Barre syndrome ([place])   . History of benign thymus tumor   . Insomnia, unspecified   . Lumbar spinal stenosis 03/30/2018   L4-5 level, severe  . Miscarriage 1962  . Mixed hyperlipidemia   . Morbid obesity ([place])   . Osteoporosis   . Pneumonia   . Polyneuropathy in diabetes(357.2)   . Rheumatoid arthritis(714.0)   . Spondylosis,  lumbosacral   . Spontaneous ecchymoses   . Type II or unspecified type diabetes mellitus with peripheral circulatory disorders, uncontrolled(250.72)   . Unspecified chronic bronchitis ([place])   . Unspecified essential hypertension   . Unspecified hypothyroidism   . Unspecified  pruritic disorder   . Unspecified urinary incontinence    Past Surgical History:  Procedure Laterality Date  . ABDOMINAL HYSTERECTOMY  1974  . abdominal tumor  2002  . APPENDECTOMY    . APPLICATION OF A-CELL OF BACK N/A 09/26/2018   Procedure: With Acell;  Surgeon: Wallace Going, DO;  Location: [facility];  Service: Plastics;  Laterality: N/A;  . APPLICATION OF WOUND VAC N/A 09/26/2018   Procedure: Vac placement;  Surgeon: Wallace Going, DO;  Location: [facility];  Service: Plastics;  Laterality: N/A;  . [place]  . BRONCHOSCOPY  2001  . CHOLECYSTECTOMY  1984  . INCISION AND DRAINAGE OF WOUND N/A 09/26/2018   Procedure: Debridement of spine wound;  Surgeon: Wallace Going, DO;  Location: [facility];  Service: Plastics;  Laterality: N/A;  . KNEE SURGERY Bilateral 08/27/2010 (L) and 01/11/2011 (R)  . LUMBAR LAMINECTOMY/DECOMPRESSION MICRODISCECTOMY N/A 07/03/2018   Procedure: Lumbar Three to Lumbar Five Laminectomy;  Surgeon: Judith Part, MD;  Location: [facility];  Service: Neurosurgery;  Laterality: N/A;  . LUMBAR WOUND DEBRIDEMENT N/A 08/20/2018   Procedure: POSTERIOR LUMBAR SPINAL WOUND DEBRIDEMENT AND REVISION;  Surgeon: Judith Part, MD;  Location: [facility];  Service: Neurosurgery;  Laterality: N/A;  POSTERIOR LUMBAR SPINAL WOUND REVISION  . [place]  . OVARIAN CYST SURGERY  1968  . thymus tumor    . 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 TONSILLECTOMY      Allergies  Allergen Reactions  . Influenza Vaccines Other (See Comments)    "h/o Guillain Barre; dr's told me years ago never to take another flu shot as it could relapse Rosalee Kaufman; has to do with when vaccine being changed to H1N1 virus"  . Penicillins Hives, Itching, Swelling and Rash    Has patient had a PCN reaction causing immediate rash, facial/tongue/throat swelling, SOB or lightheadedness with hypotension:Unknown Has patient had a PCN reaction causing severe rash involving mucus membranes or skin  necrosis: Unknown Has patient had a PCN reaction that required hospitalization:Unknown Has patient had a PCN reaction occurring within the last 10 years: Unknown If all of the above answers are "NO", then may proceed with Cephalosporin use.   . Statins Other (See Comments)    Myopathy, transaminitis  . Sulfamethoxazole-Trimethoprim Itching    Outpatient Encounter Medications as of 09/11/2019  Medication Sig  . acetaminophen (TYLENOL) 325 MG tablet Take 650 mg by mouth every 6 (six) hours as needed (for pain.).  Marland Kitchen albuterol (PROVENTIL HFA;VENTOLIN HFA) 108 (90 Base) MCG/ACT inhaler Inhale 2 puffs into the lungs every 6 (six) hours as needed for wheezing or shortness of breath.  Marland Kitchen amLODipine (NORVASC) 10 MG tablet Take 1 tablet (10 mg total) by mouth daily.  Marland Kitchen aspirin EC 81 MG tablet Take 81 mg by mouth 2 (two) times a week.  . B-D UF III MINI PEN NEEDLES 31G X 5 MM MISC USE AS DIRECTED  . Chlorphen-Phenyleph-ASA (ALKA-SELTZER PLUS COLD PO) Take by mouth as needed.  . cholecalciferol (VITAMIN D3) 25 MCG (1000 UT) tablet Take 1,000 Units by mouth daily.  Marland Kitchen docusate sodium (COLACE) 100 MG capsule Take 100 mg by mouth daily as needed (constipation.).   Marland Kitchen doxycycline (VIBRAMYCIN)  100 MG capsule Take 1 capsule (100 mg total) by mouth 2 (two) times daily. One po bid x 7 days  . estradiol (ESTRACE) 0.1 MG/GM vaginal cream Place 1 Applicatorful vaginally 3 (three) times a week.  . folic acid (FOLVITE) 1 MG tablet Take 2 tablets (2 mg total) by mouth daily.  . furosemide (LASIX) 20 MG tablet TAKE 1 TABLET BY MOUTH TWICE DAILY AS NEEDED FOR 3 POUND WEIGHT GAIN IN ONE DAY OR 5 POUNDS IN ONE WEEK  . gabapentin (NEURONTIN) 600 MG tablet Take 1 tablet (600 mg total) by mouth 3 (three) times daily.  . Insulin Lispro (HUMALOG KWIKPEN) 200 UNIT/ML SOPN Inject 20 Units into the skin 3 (three) times daily.  Marland Kitchen levothyroxine (SYNTHROID) 137 MCG tablet Take one tablet by mouth once daily 30 minutes before breakfast   . lisinopril (ZESTRIL) 20 MG tablet TAKE 1 TABLET(20 MG) BY MOUTH DAILY  . loperamide (IMODIUM A-D) 2 MG tablet Take 2 mg by mouth 4 (four) times daily as needed for diarrhea or loose stools.  . magnesium oxide (MAG-OX) 400 MG tablet Take 1 tablet (400 mg total) by mouth daily as needed (for leg cramps).  . Melatonin 10 MG TABS Take 10 mg by mouth at bedtime.  . metoprolol tartrate (LOPRESSOR) 50 MG tablet TAKE 1 TABLET(50 MG) BY MOUTH TWICE DAILY  . Multiple Vitamins-Minerals (MULTIVITAMIN ADULT PO) Take 1 tablet by mouth daily.  Marland Kitchen MYRBETRIQ 50 MG TB24 tablet TAKE 1 TABLET(50 MG) BY MOUTH DAILY  . nystatin (NYSTATIN) powder APPLY TOPICALLY THREE TIMES DAILY AS DIRECTED FOR 14 DAYS  . nystatin cream (MYCOSTATIN) Apply 1 application topically 2 (two) times daily. To skin folds  . omega-3 acid ethyl esters (LOVAZA) 1 g capsule TAKE ONE CAPSULE BY MOUTH EVERY DAY  . oxyCODONE (ROXICODONE) 5 MG immediate release tablet Take 1 tablet (5 mg total) by mouth every 6 (six) hours as needed for severe pain.  . polyvinyl alcohol (LIQUIFILM TEARS) 1.4 % ophthalmic solution Place 1 drop into both eyes 4 (four) times daily as needed (for dry/irritated eyes).   . potassium chloride (MICRO-K) 10 MEQ CR capsule Take 2 capsules (20 mEq total) by mouth 2 (two) times daily as needed (when taking a dose of lasix.).  Marland Kitchen tiZANidine (ZANAFLEX) 2 MG tablet Take 1 tablet (2 mg total) by mouth 3 (three) times daily.  . Tofacitinib Citrate (XELJANZ) 5 MG TABS Take 1 tablet (5 mg total) by mouth daily.  . TOUJEO SOLOSTAR 300 UNIT/ML SOPN INJECT 100 UNITS INTO THE SKIN EVERY NIGHT AT BEDTIME  . triamterene-hydrochlorothiazide (MAXZIDE-25) 37.5-25 MG tablet TAKE 1 TABLET BY MOUTH DAILY  . umeclidinium-vilanterol (ANORO ELLIPTA) 62.5-25 MCG/INH AEPB Inhale 1 puff into the lungs daily.  Marland Kitchen venlafaxine XR (EFFEXOR-XR) 75 MG 24 hr capsule TAKE 1 CAPSULE BY MOUTH EVERY DAY WITH BREAKFAST  . VICTOZA 18 MG/3ML SOPN ADMINISTER 1.8 MG  UNDER THE SKIN DAILY FOR BLOOD SUGAR   No facility-administered encounter medications on file as of 09/11/2019.    Review of Systems  Constitutional: Negative for appetite change, chills, fatigue and fever.  Respiratory: Negative for cough, chest tightness, shortness of breath and wheezing.   Cardiovascular: Negative for chest pain and palpitations.       Left ankle swelling   Gastrointestinal: Negative for abdominal distention, abdominal pain, constipation, diarrhea, nausea and vomiting.  Genitourinary: Positive for dysuria, frequency and urgency. Negative for difficulty urinating and flank pain.  Musculoskeletal: Positive for arthralgias and back pain.  Skin:  Negative for color change, pallor, rash and wound.       Itching scalp   Neurological: Negative for speech difficulty, weakness and numbness.  Psychiatric/Behavioral: Negative for agitation and confusion.       Uses the bathroom frequent at night keeps her awake    Immunization History  Administered Date(s) Administered  . Influenza-Unspecified 06/10/2011  . Pneumococcal Conjugate-13 07/07/2016  . Pneumococcal Polysaccharide-23 11/21/2003, 10/05/2017  . Tdap 05/28/2016   Pertinent  Health Maintenance Due  Topic Date Due  . OPHTHALMOLOGY EXAM  12/20/1950  . HEMOGLOBIN A1C  10/10/2019  . FOOT EXAM  04/17/2020  . DEXA SCAN  09/20/2025  . PNA vac Low Risk Adult  Completed  . INFLUENZA VACCINE  Discontinued   Fall Risk  09/11/2019 08/28/2019 07/25/2019 04/18/2019 04/09/2019  Falls in the past year? 0 0 1 0 0  Number falls in past yr: 0 0 0 0 0  Injury with Fall? 0 0 0 0 0  Risk for fall due to : - - - - -  Follow up - - - - -    Vitals:   09/11/19 0936  BP: 122/60  Pulse: 64  Temp: 97.7 F (36.5 C)  TempSrc: Oral  SpO2: 97%  Weight: 215 lb (97.5 kg)  Height: 5\' 6"  (1.676 m)   Body mass index is 34.7 kg/m. Physical Exam Constitutional:      General: She is not in acute distress.    Appearance: She is obese. She  is not ill-appearing.  HENT:     Head: Normocephalic.     Mouth/Throat:     Mouth: Mucous membranes are moist.     Pharynx: Oropharynx is clear. No oropharyngeal exudate or posterior oropharyngeal erythema.  Eyes:     General: No scleral icterus.       Right eye: No discharge.        Left eye: No discharge.     Conjunctiva/sclera: Conjunctivae normal.     Pupils: Pupils are equal, round, and reactive to light.  Cardiovascular:     Rate and Rhythm: Normal rate and regular rhythm.     Heart sounds: Normal heart sounds. No murmur. No friction rub. No gallop.      Comments: Diminished left pedal pulse.No Calf tenderness to palpation.  Pulmonary:     Effort: Pulmonary effort is normal. No respiratory distress.     Breath sounds: Normal breath sounds. No wheezing, rhonchi or rales.  Chest:     Chest wall: No tenderness.  Abdominal:     General: Bowel sounds are normal. There is no distension.     Palpations: Abdomen is soft. There is no mass.     Tenderness: There is no abdominal tenderness. There is no right CVA tenderness, left CVA tenderness, guarding or rebound.  Musculoskeletal:     Right lower leg: No edema.     Comments: Left ankle pitting edema and tender to palpation and pain elicit with ROM.   Skin:    General: Skin is warm and dry.     Coloration: Skin is not pale.     Findings: No bruising or rash.     Comments: Left foot bluish color to toes and red-pink color to foot and ankle and lower 1/3 of the leg has improved compared to previous visit but redness decreases with elevation of leg. Left great toe previous ulcer area healed.Non-tender to touch and no drainage.   Neurological:     Mental Status: She is alert. Mental  status is at baseline.     Cranial Nerves: No cranial nerve deficit.     Motor: No weakness.     Coordination: Coordination normal.     Gait: Gait abnormal.  Psychiatric:        Mood and Affect: Mood normal.        Behavior: Behavior normal.         Thought Content: Thought content normal.        Judgment: Judgment normal.    Labs reviewed: Recent Labs    04/09/19 0820 08/02/19 1514 09/02/19 0841  NA 141 139 141  K 5.3 4.0 4.6  CL 101 99 103  CO2 31 27 31   GLUCOSE 220* 203* 122*  BUN 19 14 24   CREATININE 0.88 0.79 0.97*  CALCIUM 9.8 9.5 9.7   Recent Labs    04/02/19 1219 04/09/19 0820 08/02/19 1514  AST 16 13 10   ALT 12 12 7   BILITOT 0.3 0.4 0.3  PROT 6.7  6.9 6.2 6.7   Recent Labs    04/09/19 0820 08/02/19 1514 09/02/19 0841  WBC 13.1* 15.5* 14.8*  NEUTROABS 9,484* 12,028* 10,449*  HGB 13.0 14.0 13.9  HCT 40.5 43.1 42.6  MCV 85.4 83.0 83.0  PLT 351 350 373   Lab Results  Component Value Date   TSH 3.75 12/07/2018   Lab Results  Component Value Date   HGBA1C 8.6 (H) 04/09/2019   Lab Results  Component Value Date   CHOL 150 09/02/2019   HDL 39 (L) 09/02/2019   LDLCALC 87 09/02/2019   TRIG 139 09/02/2019   CHOLHDL 3.8 09/02/2019    Significant Diagnostic Results in last 30 days:  DG Toe Great Left  Result Date: 08/22/2019 CLINICAL DATA:  First toe ulcer, initial encounter EXAM: LEFT GREAT TOE COMPARISON:  04/06/2018 FINDINGS: Degenerative changes of the interphalangeal joint are seen. The soft tissue wound is seen although no definitive bony erosive changes are seen to suggest osteomyelitis. No other focal abnormality is seen. IMPRESSION: Soft tissue ulcer without definitive findings of osteomyelitis. Electronically Signed   By: Inez Catalina M.D.   On: 08/22/2019 15:55   VAS [place] ABI WITH/WO TBI  Result Date: 08/29/2019 LOWER EXTREMITY DOPPLER STUDY Indications: Ulceration of left great toe High Risk Factors: Diabetes.  Performing Technologist: Ronal Fear RVS, RCS  Examination Guidelines: A complete evaluation includes at minimum, Doppler waveform signals and systolic blood pressure reading at the level of bilateral brachial, anterior tibial, and posterior tibial arteries, when vessel segments  are accessible. Bilateral testing is considered an integral part of a complete examination. Photoelectric Plethysmograph (PPG) waveforms and toe systolic pressure readings are included as required and additional duplex testing as needed. Limited examinations for reoccurring indications may be performed as noted.  ABI Findings: +---------+------------------+-----+----------+--------+ Right    Rt Pressure (mmHg)IndexWaveform  Comment  +---------+------------------+-----+----------+--------+ Brachial 158                                       +---------+------------------+-----+----------+--------+ PTA      119               0.74 monophasic         +---------+------------------+-----+----------+--------+ DP       180               1.12 biphasic           +---------+------------------+-----+----------+--------+ Heloise Ochoa  0.54                    +---------+------------------+-----+----------+--------+ +--------+------------------+-----+----------+-------+ Left    Lt Pressure (mmHg)IndexWaveform  Comment +--------+------------------+-----+----------+-------+ [id number]                                      +--------+------------------+-----+----------+-------+ PTA     126               0.78 monophasic        +--------+------------------+-----+----------+-------+ DP      115               0.71 monophasic        +--------+------------------+-----+----------+-------+ +-------+-----------+------------+------------+------------+ ABI/TBIToday's ABIToday's TBI Previous ABIPrevious TBI +-------+-----------+------------+------------+------------+ Right  1.12       0.54        0.94        0.36         +-------+-----------+------------+------------+------------+ Left   0.78       bandage/sore0.91        0.43         +-------+-----------+------------+------------+------------+ Left ABIs appear decreased compared to prior study on 09/22/2016.   Summary: Right: Resting right ankle-brachial index is within normal range. No evidence of significant right lower extremity arterial disease. The right toe-brachial index is abnormal. Left: Resting left ankle-brachial index indicates moderate left lower extremity arterial disease.  *See table(s) above for measurements and observations.  Electronically signed by Ruta Hinds MD on 08/29/2019 at 3:41:36 PM.   Final     Assessment/Plan 1. Pain and swelling of left ankle Afebrile.continue on current pain regimen.will obtain imaging to rule out acute abnormalities.  - DG Foot Complete Left; Future - DG Ankle Complete Left; Future  2. Symptoms of urinary tract infection Reports urgency,dysuria and urine frequency.No abdominal/ back pain nausea or vomiting. - POC Urinalysis Dipstick negative today.  - increase water intake to at least 6-8 glasses daily. - Notify provider if running any fever,chills or symptoms worsen or not resolved.   3.Abnormal left lower extremity ABI's Left leg Pale whitish color with elevation and returns to bluish-pink color in dependent position persist.Encouraged to follow up with vein vascular specialist Dr.todd as directed.Previous ulcer has healed. - Podiatry for left great toe callus as ordered in previous visit. - Notify provider if symptoms worsen or not resolved or running any fever.   Family/ staff Communication: Reviewed plan of care with patient  Labs/tests ordered:  - POC Urinalysis Dipstick   Next Appointment: Has appointment with Dr.Reed 02/10/2020   Sandrea Hughs, NP

## 2019-09-12 ENCOUNTER — Other Ambulatory Visit: Payer: Self-pay | Admitting: Rheumatology

## 2019-09-12 NOTE — Telephone Encounter (Signed)
Last Visit: 07/29/19 Next Visit: 10/03/19 Labs: 08/02/19 Glucose is elevated-203. Rest of CMP WNL. WBC count and RBC count are elevated.   Okay to refill per Dr. Estanislado Pandy

## 2019-09-13 ENCOUNTER — Other Ambulatory Visit: Payer: Self-pay | Admitting: Internal Medicine

## 2019-09-13 DIAGNOSIS — E1151 Type 2 diabetes mellitus with diabetic peripheral angiopathy without gangrene: Secondary | ICD-10-CM

## 2019-09-16 ENCOUNTER — Encounter: Payer: Medicare Other | Admitting: Physical Medicine and Rehabilitation

## 2019-09-19 ENCOUNTER — Other Ambulatory Visit: Payer: Self-pay | Admitting: *Deleted

## 2019-09-19 DIAGNOSIS — E119 Type 2 diabetes mellitus without complications: Secondary | ICD-10-CM | POA: Diagnosis not present

## 2019-09-19 DIAGNOSIS — G8929 Other chronic pain: Secondary | ICD-10-CM

## 2019-09-19 DIAGNOSIS — Z01818 Encounter for other preprocedural examination: Secondary | ICD-10-CM | POA: Diagnosis not present

## 2019-09-19 DIAGNOSIS — H25812 Combined forms of age-related cataract, left eye: Secondary | ICD-10-CM | POA: Diagnosis not present

## 2019-09-19 DIAGNOSIS — H25811 Combined forms of age-related cataract, right eye: Secondary | ICD-10-CM | POA: Diagnosis not present

## 2019-09-19 MED ORDER — OXYCODONE HCL 5 MG PO TABS: 5.0000 mg | ORAL_TABLET | Freq: Four times a day (QID) | ORAL | 0 refills | Status: DC | PRN

## 2019-09-19 NOTE — Telephone Encounter (Signed)
Patient requested refill [place]CC[place] Database Verified LR: 08/21/2019 Narcotic Contract updated.  Pended Rx and sent to Dr. Mariea Clonts for approval.

## 2019-09-26 ENCOUNTER — Other Ambulatory Visit: Payer: Self-pay | Admitting: Internal Medicine

## 2019-10-03 ENCOUNTER — Ambulatory Visit: Payer: Medicare Other | Admitting: Rheumatology

## 2019-10-03 DIAGNOSIS — H2511 Age-related nuclear cataract, right eye: Secondary | ICD-10-CM | POA: Diagnosis not present

## 2019-10-03 DIAGNOSIS — H25811 Combined forms of age-related cataract, right eye: Secondary | ICD-10-CM | POA: Diagnosis not present

## 2019-10-04 NOTE — Progress Notes (Deleted)
Office Visit Note  Patient: Katherine Delgado             Date of Birth: 1941-02-07           MRN: [id number]             PCP: Gayland Curry, DO Referring: Gayland Curry, DO Visit Date: 10/11/2019 Occupation: @GUAROCC @  Subjective:  No chief complaint on file.   History of Present Illness: Katherine Delgado is a 79 y.o. female ***   Activities of Daily Living:  Patient reports morning stiffness for *** {minute/hour:19697}.   Patient {ACTIONS;DENIES/REPORTS:21021675::"Denies"} nocturnal pain.  Difficulty dressing/grooming: {ACTIONS;DENIES/REPORTS:21021675::"Denies"} Difficulty climbing stairs: {ACTIONS;DENIES/REPORTS:21021675::"Denies"} Difficulty getting out of chair: {ACTIONS;DENIES/REPORTS:21021675::"Denies"} Difficulty using hands for taps, buttons, cutlery, and/or writing: {ACTIONS;DENIES/REPORTS:21021675::"Denies"}  No Rheumatology ROS completed.   PMFS History:  Patient Active Problem List   Diagnosis Date Noted  . Primary osteoarthritis of right knee 07/25/2019  . Mild episode of recurrent major depressive disorder ([place]) 10/22/2018  . Wound dehiscence 08/17/2018  . Lumbar spinal stenosis 03/30/2018  . Rheumatoid arthritis involving both wrists with positive rheumatoid factor ([place]) 06/14/2016  . High risk medication use 06/14/2016  . Sjogren's syndrome ([place]) 06/14/2016  . Primary osteoarthritis of both knees 06/14/2016  . DDD (degenerative disc disease), lumbar 06/14/2016  . Hypothyroidism 06/14/2016  . Osteoporosis 06/14/2016  . Abnormality of gait 04/19/2016  . Type 2 diabetes mellitus with diabetic polyneuropathy, with long-term current use of insulin ([place]) 11/27/2015  . Diabetes mellitus without complication ([place]) 123456  . Headache(784.0) 07/31/2013  . Sinus infection 07/31/2013  . Chronic right-sided low back pain with right-sided sciatica 03/14/2013  . Insomnia 12/06/2012  . Nausea alone 12/06/2012  . Diarrhea 12/06/2012  . Urinary incontinence, urge  12/06/2012  . Lumbosacral root lesions, not elsewhere classified 11/27/2012  . Diabetic polyneuropathy associated with type 2 diabetes mellitus ([place]) 11/27/2012  . EDEMA 05/04/2010  . YEAST INFECTION 05/03/2010  . Hyperlipidemia 05/03/2010  . DEPRESSION 05/03/2010  . History of peripheral neuropathy 05/03/2010  . Essential hypertension 05/03/2010  . ALLERGIC RHINITIS 05/03/2010  . PNEUMONIA 05/03/2010  . COPD (chronic obstructive pulmonary disease) ([place]) 05/03/2010  . GERD 05/03/2010  . Fibromyalgia 05/03/2010  . DYSPNEA 05/03/2010  . CHEST PAIN 05/03/2010    Past Medical History:  Diagnosis Date  . Abnormality of gait 04/19/2016  . Acute infective polyneuritis ([place]) 2002  . Allergic rhinitis due to pollen   . Chronic pain syndrome   . COPD (chronic obstructive pulmonary disease) ([facility])    chronic bronchitis  . Depressive disorder, not elsewhere classified   . Diabetes mellitus without complication ([place])   . Diaphragmatic hernia without mention of obstruction or gangrene   . Dyslipidemia   . Fibromyalgia   . GERD (gastroesophageal reflux disease)    with h/o esophagitis  . Guillain-Barre syndrome ([place])   . History of benign thymus tumor   . Insomnia, unspecified   . Lumbar spinal stenosis 03/30/2018   L4-5 level, severe  . Miscarriage 1962  . Mixed hyperlipidemia   . Morbid obesity ([place])   . Osteoporosis   . Pneumonia   . Polyneuropathy in diabetes(357.2)   . Rheumatoid arthritis(714.0)   . Spondylosis, lumbosacral   . Spontaneous ecchymoses   . Type II or unspecified type diabetes mellitus with peripheral circulatory disorders, uncontrolled(250.72)   . Unspecified chronic bronchitis ([place])   . Unspecified essential hypertension   . Unspecified hypothyroidism   . Unspecified pruritic disorder   . Unspecified urinary incontinence  Family History  Problem Relation Age of Onset  . Alzheimer's disease Mother   . Heart disease Mother   . Heart disease Father   .  Liver disease Father   . Cancer Brother   . Arthritis Son   . Colon polyps Brother   . Colon cancer Neg Hx   . Esophageal cancer Neg Hx   . Kidney disease Neg Hx   . Stomach cancer Neg Hx   . Rectal cancer Neg Hx    Past Surgical History:  Procedure Laterality Date  . ABDOMINAL HYSTERECTOMY  1974  . abdominal tumor  2002  . APPENDECTOMY    . APPLICATION OF A-CELL OF BACK N/A 09/26/2018   Procedure: With Acell;  Surgeon: Wallace Going, DO;  Location: [facility];  Service: Plastics;  Laterality: N/A;  . APPLICATION OF WOUND VAC N/A 09/26/2018   Procedure: Vac placement;  Surgeon: Wallace Going, DO;  Location: [facility];  Service: Plastics;  Laterality: N/A;  . [place]  . BRONCHOSCOPY  2001  . CHOLECYSTECTOMY  1984  . INCISION AND DRAINAGE OF WOUND N/A 09/26/2018   Procedure: Debridement of spine wound;  Surgeon: Wallace Going, DO;  Location: [facility];  Service: Plastics;  Laterality: N/A;  . KNEE SURGERY Bilateral 08/27/2010 (L) and 01/11/2011 (R)  . LUMBAR LAMINECTOMY/DECOMPRESSION MICRODISCECTOMY N/A 07/03/2018   Procedure: Lumbar Three to Lumbar Five Laminectomy;  Surgeon: Judith Part, MD;  Location: [facility];  Service: Neurosurgery;  Laterality: N/A;  . LUMBAR WOUND DEBRIDEMENT N/A 08/20/2018   Procedure: POSTERIOR LUMBAR SPINAL WOUND DEBRIDEMENT AND REVISION;  Surgeon: Judith Part, MD;  Location: [facility];  Service: Neurosurgery;  Laterality: N/A;  POSTERIOR LUMBAR SPINAL WOUND REVISION  . [place]  . OVARIAN CYST SURGERY  1968  . thymus tumor    . 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 TONSILLECTOMY     Social History   Social History Narrative   Walks with cane   Right handed    Caffeine use: Coffee (2 cups every morning)   Tea: sometimes   Soda: none   Immunization History  Administered Date(s) Administered  . Influenza-Unspecified 06/10/2011  . Pneumococcal Conjugate-13 07/07/2016  . Pneumococcal Polysaccharide-23 11/21/2003, 10/05/2017  .  Tdap 05/28/2016     Objective: Vital Signs: There were no vitals taken for this visit.   Physical Exam   Musculoskeletal Exam: ***  CDAI Exam: CDAI Score: -- Patient Global: --; Provider Global: -- Swollen: --; Tender: -- Joint Exam 10/11/2019   No joint exam has been documented for this visit   There is currently no information documented on the homunculus. Go to the Rheumatology activity and complete the homunculus joint exam.  Investigation: No additional findings.  Imaging: No results found.  Recent Labs: Lab Results  Component Value Date   WBC 14.8 (H) 09/02/2019   HGB 13.9 09/02/2019   PLT 373 09/02/2019   NA 141 09/02/2019   K 4.6 09/02/2019   CL 103 09/02/2019   CO2 31 09/02/2019   GLUCOSE 122 (H) 09/02/2019   BUN 24 09/02/2019   CREATININE 0.97 (H) 09/02/2019   BILITOT 0.3 08/02/2019   ALKPHOS 113 02/20/2017   AST 10 08/02/2019   ALT 7 08/02/2019   PROT 6.7 08/02/2019   ALBUMIN 2.6 (L) 08/22/2018   CALCIUM 9.7 09/02/2019   GFRAA 83 08/02/2019   QFTBGOLDPLUS NEGATIVE 04/02/2019    Speciality Comments: No specialty comments available.  Procedures:  No procedures performed  Allergies: Influenza vaccines, Penicillins, Statins, and Sulfamethoxazole-trimethoprim   Assessment / Plan:     Visit Diagnoses: No diagnosis found.  Orders: No orders of the defined types were placed in this encounter.  No orders of the defined types were placed in this encounter.   Face-to-face time spent with patient was *** minutes. Greater than 50% of time was spent in counseling and coordination of care.  Follow-Up Instructions: No follow-ups on file.   Ofilia Neas, PA-C  Note - This record has been created using Dragon software.  Chart creation errors have been sought, but may not always  have been located. Such creation errors do not reflect on  the standard of medical care.

## 2019-10-11 ENCOUNTER — Ambulatory Visit: Payer: Medicare Other | Admitting: Rheumatology

## 2019-10-16 DIAGNOSIS — Z23 Encounter for immunization: Secondary | ICD-10-CM | POA: Diagnosis not present

## 2019-10-17 ENCOUNTER — Other Ambulatory Visit: Payer: Self-pay | Admitting: *Deleted

## 2019-10-17 DIAGNOSIS — G8929 Other chronic pain: Secondary | ICD-10-CM

## 2019-10-17 MED ORDER — OXYCODONE HCL 5 MG PO TABS: 5.0000 mg | ORAL_TABLET | Freq: Four times a day (QID) | ORAL | 0 refills | Status: DC | PRN

## 2019-10-17 NOTE — Telephone Encounter (Signed)
Patient requested refill [place]CCSRS Database Verified LR: 09/19/2019 Narcotic Contract updated.  Pended Rx and sent to Dr. Mariea Clonts for approval.

## 2019-10-23 ENCOUNTER — Other Ambulatory Visit: Payer: Self-pay | Admitting: Internal Medicine

## 2019-10-24 ENCOUNTER — Other Ambulatory Visit: Payer: Self-pay

## 2019-10-24 ENCOUNTER — Ambulatory Visit (INDEPENDENT_AMBULATORY_CARE_PROVIDER_SITE_OTHER): Payer: Medicare Other | Admitting: Podiatry

## 2019-10-24 DIAGNOSIS — M2041 Other hammer toe(s) (acquired), right foot: Secondary | ICD-10-CM

## 2019-10-24 DIAGNOSIS — M2012 Hallux valgus (acquired), left foot: Secondary | ICD-10-CM

## 2019-10-24 DIAGNOSIS — M2042 Other hammer toe(s) (acquired), left foot: Secondary | ICD-10-CM

## 2019-10-24 DIAGNOSIS — E11621 Type 2 diabetes mellitus with foot ulcer: Secondary | ICD-10-CM | POA: Diagnosis not present

## 2019-10-24 DIAGNOSIS — L97521 Non-pressure chronic ulcer of other part of left foot limited to breakdown of skin: Secondary | ICD-10-CM

## 2019-10-24 DIAGNOSIS — I739 Peripheral vascular disease, unspecified: Secondary | ICD-10-CM

## 2019-10-24 DIAGNOSIS — M2011 Hallux valgus (acquired), right foot: Secondary | ICD-10-CM | POA: Diagnosis not present

## 2019-10-31 DIAGNOSIS — H2511 Age-related nuclear cataract, right eye: Secondary | ICD-10-CM | POA: Diagnosis not present

## 2019-10-31 DIAGNOSIS — H2512 Age-related nuclear cataract, left eye: Secondary | ICD-10-CM | POA: Diagnosis not present

## 2019-10-31 DIAGNOSIS — H25812 Combined forms of age-related cataract, left eye: Secondary | ICD-10-CM | POA: Diagnosis not present

## 2019-11-13 DIAGNOSIS — Z23 Encounter for immunization: Secondary | ICD-10-CM | POA: Diagnosis not present

## 2019-11-14 ENCOUNTER — Other Ambulatory Visit: Payer: Self-pay | Admitting: *Deleted

## 2019-11-14 DIAGNOSIS — G8929 Other chronic pain: Secondary | ICD-10-CM

## 2019-11-14 DIAGNOSIS — M5441 Lumbago with sciatica, right side: Secondary | ICD-10-CM

## 2019-11-14 MED ORDER — OXYCODONE HCL 5 MG PO TABS: 5.0000 mg | ORAL_TABLET | Freq: Four times a day (QID) | ORAL | 0 refills | Status: DC | PRN

## 2019-11-14 NOTE — Telephone Encounter (Signed)
Patient requested refill NCCSRS Database Verified LR: 10/17/2019 Narcotic Contract updated Pended Rx and sent to Dr. Mariea Clonts for approval.

## 2019-11-25 ENCOUNTER — Encounter: Payer: Medicare Other | Admitting: Physical Medicine and Rehabilitation

## 2019-12-04 ENCOUNTER — Other Ambulatory Visit: Payer: Self-pay | Admitting: Family

## 2019-12-04 NOTE — Telephone Encounter (Signed)
Medication denied, originally filled by podiatry

## 2019-12-16 ENCOUNTER — Other Ambulatory Visit: Payer: Self-pay | Admitting: *Deleted

## 2019-12-16 DIAGNOSIS — M5441 Lumbago with sciatica, right side: Secondary | ICD-10-CM

## 2019-12-16 DIAGNOSIS — G8929 Other chronic pain: Secondary | ICD-10-CM

## 2019-12-16 MED ORDER — OXYCODONE HCL 5 MG PO TABS: 5.0000 mg | ORAL_TABLET | Freq: Four times a day (QID) | ORAL | 0 refills | Status: DC | PRN

## 2019-12-16 NOTE — Telephone Encounter (Signed)
Patient requested refill NCCSRS Database Verified LR: 11/14/2019 Pended Rx and sent to Dr. Mariea Clonts for approval.

## 2019-12-19 ENCOUNTER — Telehealth: Payer: Self-pay | Admitting: *Deleted

## 2019-12-19 DIAGNOSIS — J42 Unspecified chronic bronchitis: Secondary | ICD-10-CM

## 2019-12-19 MED ORDER — ANORO ELLIPTA 62.5-25 MCG/INH IN AEPB: 1.0000 | INHALATION_SPRAY | Freq: Every day | RESPIRATORY_TRACT | 5 refills | Status: DC

## 2019-12-19 NOTE — Telephone Encounter (Signed)
Patient called requesting rx for Anora Inhaler. Patient stated that Dr. Nyoka Cowden gave this to her when he was here. I explained to patient that was not in her current medication list, Albuterol inhaler is. Patient stated that she would prefer the Anora instead.  Please Advise.

## 2019-12-19 NOTE — Telephone Encounter (Signed)
I see the anoro ellipta on her med list.  It's ok to refill it.  She sees me in June.

## 2019-12-19 NOTE — Telephone Encounter (Signed)
Patient notified and agreed. Faxed Rx to pharmacy.  

## 2019-12-27 ENCOUNTER — Other Ambulatory Visit: Payer: Self-pay | Admitting: Internal Medicine

## 2019-12-27 DIAGNOSIS — E1151 Type 2 diabetes mellitus with diabetic peripheral angiopathy without gangrene: Secondary | ICD-10-CM

## 2019-12-27 NOTE — Telephone Encounter (Signed)
rx sent to pharmacy by e-script  

## 2019-12-30 ENCOUNTER — Other Ambulatory Visit: Payer: Self-pay | Admitting: Family

## 2019-12-30 NOTE — Telephone Encounter (Signed)
Will send to Dr.Reed for review and approval. Instructions from the pharmacy state for patient to take x 6 months, it appears patient on medication longer than a 6 month period, not sure if this is suppose to be a long-term medication.

## 2020-01-06 ENCOUNTER — Other Ambulatory Visit: Payer: Self-pay | Admitting: Family

## 2020-01-06 NOTE — Telephone Encounter (Signed)
Patient is requesting refill on medication "Diflucan". Please Advise.

## 2020-01-06 NOTE — Telephone Encounter (Signed)
It is not a long-term medication.

## 2020-01-07 NOTE — Telephone Encounter (Signed)
Would you like me to call the patient and schedule an appointment for this medication? Or Would you like for me to fill this medication for the patient. Please Advise.

## 2020-01-07 NOTE — Telephone Encounter (Signed)
Patient was called and no answer. Voicemail was left with office call back number as well as my name "Demarrion Meiklejohn". So that the patient can be addressed with the correct questions. I will attempt to call patient again in a few minutes.

## 2020-01-07 NOTE — Telephone Encounter (Signed)
Patient was called back and patient states that she has ongoing yeast infections and UTI's from wearing Depends. Patient states that she would like medication refilled to clear up this issue. Please Advise.

## 2020-01-07 NOTE — Telephone Encounter (Signed)
Please call her and find out why she needs it again.  It had been prescribed for a 6 month period which seems like a lot to begin with.  Usually it's as needed for severe yeast infections that don't resolve with topical therapy.

## 2020-01-08 NOTE — Telephone Encounter (Signed)
Patient was called and notified that prescription was sent in.

## 2020-01-14 ENCOUNTER — Ambulatory Visit (INDEPENDENT_AMBULATORY_CARE_PROVIDER_SITE_OTHER): Payer: Medicare Other | Admitting: Family

## 2020-01-14 ENCOUNTER — Encounter: Payer: Self-pay | Admitting: Family

## 2020-01-14 ENCOUNTER — Other Ambulatory Visit: Payer: Self-pay | Admitting: *Deleted

## 2020-01-14 ENCOUNTER — Other Ambulatory Visit: Payer: Self-pay

## 2020-01-14 DIAGNOSIS — G8929 Other chronic pain: Secondary | ICD-10-CM

## 2020-01-14 DIAGNOSIS — Z0000 Encounter for general adult medical examination without abnormal findings: Secondary | ICD-10-CM | POA: Diagnosis not present

## 2020-01-14 MED ORDER — OXYCODONE HCL 5 MG PO TABS: 5.0000 mg | ORAL_TABLET | Freq: Four times a day (QID) | ORAL | 0 refills | Status: DC | PRN

## 2020-01-14 NOTE — Progress Notes (Signed)
Subjective:   Katherine Delgado is a 79 y.o. female who presents for Medicare Annual (Subsequent) preventive examination.  Review of Systems:  Cardiac Risk Factors include: advanced age (>4men, >47 women);diabetes mellitus;obesity (BMI >30kg/m2);smoking/ tobacco exposure;hypertension     Objective:     Vitals: There were no vitals taken for this visit.  There is no height or weight on file to calculate BMI.  Advanced Directives 01/14/2020 01/11/2019 08/17/2018 07/03/2018 04/05/2018 10/05/2017 02/20/2017  Does Patient Have a Medical Advance Directive? Yes Yes Yes;No Yes Yes Yes Yes  Type of Advance Directive Out of facility DNR (pink MOST or yellow form) Living will;Healthcare Power of Attorney Living will Living will Out of facility DNR (pink MOST or yellow form) [place];Living will Out of facility DNR (pink MOST or yellow form)  Does patient want to make changes to medical advance directive? No - Patient declined - No - Patient declined No - Patient declined No - Patient declined No - Patient declined -  Copy of [place] in Chart? - - - - - No - copy requested -  Would patient like information on creating a medical advance directive? - - No - Patient declined - - - -  Pre-existing out of facility DNR order (yellow form or pink MOST form) - - - - Pink MOST form placed in chart (order not valid for inpatient use) - Pink MOST form placed in chart (order not valid for inpatient use)    Tobacco Social History   Tobacco Use  Smoking Status Former Smoker  . Packs/day: 0.10  . Years: 10.00  . Pack years: 1.00  . Types: Cigarettes  . Quit date: 12/07/1979  . Years since quitting: 40.1  Smokeless Tobacco Never Used     Counseling given: Not Answered   Clinical Intake:  Pre-visit preparation completed: No  Pain : 0-10 Pain Score: 7  Pain Type: Chronic pain Pain Location: Generalized(arms,leg ,neck,(head to toe)) Pain Orientation: Other  (Comment)(generalized) Pain Radiating Towards: generalized pain Pain Descriptors / Indicators: Aching(fatigue) Pain Onset: Other (comment)(several years) Pain Frequency: Constant(some comes and goes) Pain Relieving Factors: Oxycodone, Gabapentin Effect of Pain on Daily Activities: yes - sleep  Pain Relieving Factors: Oxycodone, Gabapentin  BMI - recorded: 34.72 Nutritional Status: BMI > 30  Obese Nutritional Risks: None Diabetes: Yes CBG done?: No Did pt. bring in CBG monitor from home?: No(100's - 120's)  How often do you need to have someone help you when you read instructions, pamphlets, or other written materials from your doctor or pharmacy?: 1 - Never What is the last grade level you completed in school?: [employer]  Interpreter Needed?: No  Information entered by :: Dinah Ngetich FNP-C  Past Medical History:  Diagnosis Date  . Abnormality of gait 04/19/2016  . Acute infective polyneuritis ([place]) 2002  . Allergic rhinitis due to pollen   . Chronic pain syndrome   . COPD (chronic obstructive pulmonary disease) (HCC)    chronic bronchitis  . Depressive disorder, not elsewhere classified   . Diabetes mellitus without complication ([place])   . Diaphragmatic hernia without mention of obstruction or gangrene   . Dyslipidemia   . Fibromyalgia   . GERD (gastroesophageal reflux disease)    with h/o esophagitis  . Guillain-Barre syndrome ([place])   . History of benign thymus tumor   . Insomnia, unspecified   . Lumbar spinal stenosis 03/30/2018   L4-5 level, severe  . Miscarriage 1962  . Mixed hyperlipidemia   .  Morbid obesity ([place])   . Osteoporosis   . Pneumonia   . Polyneuropathy in diabetes(357.2)   . Rheumatoid arthritis(714.0)   . Spondylosis, lumbosacral   . Spontaneous ecchymoses   . Type II or unspecified type diabetes mellitus with peripheral circulatory disorders, uncontrolled(250.72)   . Unspecified chronic bronchitis ([place])   . Unspecified essential hypertension    . Unspecified hypothyroidism   . Unspecified pruritic disorder   . Unspecified urinary incontinence    Past Surgical History:  Procedure Laterality Date  . ABDOMINAL HYSTERECTOMY  1974  . abdominal tumor  2002  . APPENDECTOMY    . APPLICATION OF A-CELL OF BACK N/A 09/26/2018   Procedure: With [place];  Surgeon: Wallace Going, DO;  Location: [facility];  Service: Plastics;  Laterality: N/A;  . APPLICATION OF WOUND VAC N/A 09/26/2018   Procedure: Vac placement;  Surgeon: Wallace Going, DO;  Location: [facility];  Service: Plastics;  Laterality: N/A;  . [place]  . BRONCHOSCOPY  2001  . CHOLECYSTECTOMY  1984  . INCISION AND DRAINAGE OF WOUND N/A 09/26/2018   Procedure: Debridement of spine wound;  Surgeon: Wallace Going, DO;  Location: [facility];  Service: Plastics;  Laterality: N/A;  . KNEE SURGERY Bilateral 08/27/2010 (L) and 01/11/2011 (R)  . LUMBAR LAMINECTOMY/DECOMPRESSION MICRODISCECTOMY N/A 07/03/2018   Procedure: Lumbar Three to Lumbar Five Laminectomy;  Surgeon: Judith Part, MD;  Location: [facility];  Service: Neurosurgery;  Laterality: N/A;  . LUMBAR WOUND DEBRIDEMENT N/A 08/20/2018   Procedure: POSTERIOR LUMBAR SPINAL WOUND DEBRIDEMENT AND REVISION;  Surgeon: Judith Part, MD;  Location: [facility] [place] [facility];  Service: Neurosurgery;  Laterality: N/A;  POSTERIOR LUMBAR SPINAL WOUND REVISION  . [place]  . OVARIAN CYST SURGERY  1968  . thymus tumor    . 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 TONSILLECTOMY     Family History  Problem Relation Age of Onset  . Alzheimer's disease Mother   . Heart disease Mother   . Heart disease Father   . Liver disease Father   . Cancer Brother   . Arthritis Son   . Colon polyps Brother   . Colon cancer Neg Hx   . Esophageal cancer Neg Hx   . Kidney disease Neg Hx   . Stomach cancer Neg Hx   . Rectal cancer Neg Hx    Social History   Socioeconomic History  . Marital status: Widowed    Spouse name: Not on file  . Number of  children: 2  . Years of education: 69  . Highest education level: Not on file  Occupational History  . Occupation: Retired  Tobacco Use  . Smoking status: Former Smoker    Packs/day: 0.10    Years: 10.00    Pack years: 1.00    Types: Cigarettes    Quit date: 12/07/1979    Years since quitting: 40.1  . Smokeless tobacco: Never Used  Substance and Sexual Activity  . Alcohol use: No    Alcohol/week: 0.0 standard drinks  . Drug use: No  . Sexual activity: Never  Other Topics Concern  . Not on file  Social History Narrative   Walks with cane   Right handed    Caffeine use: Coffee (2 cups every morning)   Tea: sometimes   Soda: none   Social Determinants of Radio broadcast assistant Strain:   . Difficulty of Paying Living Expenses:   Food Insecurity:   . Worried About  Running Out of Food in the Last Year:   . Force in the Last Year:   Transportation Needs:   . Lack of Transportation (Medical):   Marland Kitchen Lack of Transportation (Non-Medical):   Physical Activity:   . Days of Exercise per Week:   . Minutes of Exercise per Session:   Stress:   . Feeling of Stress :   Social Connections:   . Frequency of Communication with Friends and Family:   . Frequency of Social Gatherings with Friends and Family:   . Attends Religious Services:   . Active Member of Clubs or Organizations:   . Attends Archivist Meetings:   Marland Kitchen Marital Status:     Outpatient Encounter Medications as of 01/14/2020  Medication Sig  . acetaminophen (TYLENOL) 325 MG tablet Take 650 mg by mouth every 6 (six) hours as needed (for pain.).  Marland Kitchen amLODipine (NORVASC) 10 MG tablet Take 1 tablet (10 mg total) by mouth daily.  Marland Kitchen aspirin EC 81 MG tablet Take 81 mg by mouth 2 (two) times a week.  . B-D UF III MINI PEN NEEDLES 31G X 5 MM MISC USE AS DIRECTED  . Chlorphen-Phenyleph-ASA (ALKA-SELTZER PLUS COLD PO) Take by mouth as needed.  . cholecalciferol (VITAMIN D3) 25 MCG (1000 UT) tablet Take 1,000  Units by mouth daily.  Marland Kitchen docusate sodium (COLACE) 100 MG capsule Take 100 mg by mouth daily as needed (constipation.).   Marland Kitchen folic acid (FOLVITE) 1 MG tablet Take 2 tablets (2 mg total) by mouth daily.  . furosemide (LASIX) 20 MG tablet TAKE 1 TABLET BY MOUTH TWICE DAILY AS NEEDED FOR 3 POUND WEIGHT GAIN IN ONE DAY OR 5 POUNDS IN ONE WEEK  . gabapentin (NEURONTIN) 600 MG tablet TAKE 1 TABLET(600 MG) BY MOUTH THREE TIMES DAILY  . Insulin Lispro (HUMALOG KWIKPEN) 200 UNIT/ML SOPN Inject 20 Units into the skin 3 (three) times daily.  Marland Kitchen levothyroxine (SYNTHROID) 137 MCG tablet TAKE 1 TABLET BY MOUTH EVERY DAY 30 MINUTES BEFORE BREAKFAST  . lisinopril (ZESTRIL) 20 MG tablet TAKE 1 TABLET(20 MG) BY MOUTH DAILY  . loperamide (IMODIUM A-D) 2 MG tablet Take 2 mg by mouth 4 (four) times daily as needed for diarrhea or loose stools.  . magnesium oxide (MAG-OX) 400 MG tablet Take 1 tablet (400 mg total) by mouth daily as needed (for leg cramps).  . Melatonin 10 MG TABS Take 10 mg by mouth at bedtime.  . metoprolol tartrate (LOPRESSOR) 50 MG tablet TAKE 1 TABLET(50 MG) BY MOUTH TWICE DAILY  . Multiple Vitamins-Minerals (MULTIVITAMIN ADULT PO) Take 1 tablet by mouth daily.  Marland Kitchen MYRBETRIQ 50 MG TB24 tablet TAKE 1 TABLET(50 MG) BY MOUTH DAILY  . nystatin (NYSTATIN) powder APPLY TOPICALLY THREE TIMES DAILY AS DIRECTED FOR 14 DAYS  . nystatin cream (MYCOSTATIN) Apply 1 application topically 2 (two) times daily. To skin folds  . omega-3 acid ethyl esters (LOVAZA) 1 g capsule TAKE ONE CAPSULE BY MOUTH EVERY DAY  . oxyCODONE (ROXICODONE) 5 MG immediate release tablet Take 1 tablet (5 mg total) by mouth every 6 (six) hours as needed for severe pain.  . polyvinyl alcohol (LIQUIFILM TEARS) 1.4 % ophthalmic solution Place 1 drop into both eyes 4 (four) times daily as needed (for dry/irritated eyes).   . potassium chloride (MICRO-K) 10 MEQ CR capsule Take 2 capsules (20 mEq total) by mouth 2 (two) times daily as needed (when  taking a dose of lasix.).  Marland Kitchen tiZANidine (ZANAFLEX) 2  MG tablet TAKE 1 TABLET(2 MG) BY MOUTH THREE TIMES DAILY  . TOUJEO SOLOSTAR 300 UNIT/ML SOPN INJECT 100 UNITS INTO THE SKIN EVERY NIGHT AT BEDTIME  . triamterene-hydrochlorothiazide (MAXZIDE-25) 37.5-25 MG tablet TAKE 1 TABLET BY MOUTH DAILY  . umeclidinium-vilanterol (ANORO ELLIPTA) 62.5-25 MCG/INH AEPB Inhale 1 puff into the lungs daily.  Marland Kitchen venlafaxine XR (EFFEXOR-XR) 75 MG 24 hr capsule TAKE 1 CAPSULE BY MOUTH EVERY DAY WITH BREAKFAST  . VICTOZA 18 MG/3ML SOPN ADMINISTER 1.8 MG UNDER THE SKIN DAILY FOR BLOOD SUGAR  . XELJANZ 5 MG TABS TAKE 1 TABLET BY MOUTH  DAILY  . [DISCONTINUED] albuterol (PROVENTIL HFA;VENTOLIN HFA) 108 (90 Base) MCG/ACT inhaler Inhale 2 puffs into the lungs every 6 (six) hours as needed for wheezing or shortness of breath.  . [DISCONTINUED] doxycycline (VIBRAMYCIN) 100 MG capsule Take 1 capsule (100 mg total) by mouth 2 (two) times daily. One po bid x 7 days  . [DISCONTINUED] estradiol (ESTRACE) 0.1 MG/GM vaginal cream Place 1 Applicatorful vaginally 3 (three) times a week.  . [DISCONTINUED] fluconazole (DIFLUCAN) 100 MG tablet TAKE 1 AND 1/2 TABLETS(150 MG) BY MOUTH DAILY FOR 7 DAYS  . [DISCONTINUED] fluconazole (DIFLUCAN) 150 MG tablet    No facility-administered encounter medications on file as of 01/14/2020.    Activities of Daily Living In your present state of health, do you have any difficulty performing the following activities: 01/14/2020  Hearing? N  Vision? N  Difficulty concentrating or making decisions? N  Walking or climbing stairs? Y  Comment pain on knees  Dressing or bathing? N  Doing errands, shopping? Y  Comment son [place] and eating ? N  Using the Toilet? N  In the past six months, have you accidently leaked urine? Y  Comment wears diapers  Do you have problems with loss of bowel control? Y  Comment wears diapers  Managing your Medications? N  Managing your Finances? N   Housekeeping or managing your Housekeeping? Y  Comment son assist  Some recent data might be hidden    Patient Care Team: Gayland Curry, DO as PCP - General (Geriatric Medicine) Bo Merino, MD (Rheumatology) Al[place]imer, Legrand Como, MD as Attending Physician (Endocrinology) Newt Minion, MD as Consulting Physician (Orthopedic Surgery)    Assessment:   This is a routine wellness examination for [place].  Exercise Activities and Dietary recommendations Current Exercise Habits: Home exercise routine, Type of exercise: stretching;walking, Time (Minutes): 10, Frequency (Times/Week): 3, Weekly Exercise (Minutes/Week): 30, Intensity: Mild, Exercise limited by: Other - see comments(generalized pain)  Goals    . Patient Stated     Patient would like to get back into church.    . Weight (lb) < 200 lb (90.7 kg)     01/11/2019 would like to continue to loss weight to under 250 lbs     . Weight (lb) < 250 lb (113.4 kg)     Starting 07/07/16,  I will attempt to decrease my weight and get under 250 lbs.        Fall Risk Fall Risk  01/14/2020 09/11/2019 08/28/2019 07/25/2019 04/18/2019  Falls in the past year? 0 0 0 1 0  Number falls in past yr: 0 0 0 0 0  Injury with Fall? 0 0 0 0 0  Risk for fall due to : - - - - -  Follow up - - - - -   Is the patient's home free of loose throw rugs in walkways, pet beds, electrical cords,  etc?   no      Grab bars in the bathroom? yes      Handrails on the stairs?   yes      Adequate lighting?   yes  Depression Screen PHQ 2/9 Scores 01/14/2020 09/11/2019 04/18/2019 01/11/2019  PHQ - 2 Score 0 0 0 0     Cognitive Function MMSE - Mini Mental State Exam 10/05/2017 07/07/2016  Orientation to time 5 5  Orientation to Place 5 5  Registration 3 3  Attention/ Calculation 5 5  Recall 3 3  Language- name 2 objects 2 2  Language- repeat 1 1  Language- follow 3 step command 3 3  Language- read & follow direction 1 1  Write a sentence 1 1  Copy design 1 1   Total score 30 30     6CIT Screen 01/14/2020 01/11/2019  What Year? 0 points 0 points  What month? 0 points 0 points  What time? 0 points 0 points  Count back from 20 0 points 0 points  Months in reverse 0 points 0 points  Repeat phrase 0 points 0 points  Total Score 0 0    Immunization History  Administered Date(s) Administered  . Influenza-Unspecified 06/10/2011  . PFIZER SARS-COV-2 Vaccination 11/13/2019  . Pneumococcal Conjugate-13 07/07/2016  . Pneumococcal Polysaccharide-23 11/21/2003, 10/05/2017  . Tdap 05/28/2016    Qualifies for Shingles Vaccine? Too expensive   Screening Tests Health Maintenance  Topic Date Due  . OPHTHALMOLOGY EXAM  Never done  . HEMOGLOBIN A1C  10/10/2019  . COVID-19 Vaccine (2 - Pfizer 2-dose series) 12/04/2019  . FOOT EXAM  04/17/2020  . DEXA SCAN  09/20/2025  . TETANUS/TDAP  05/28/2026  . PNA vac Low Risk Adult  Completed  . INFLUENZA VACCINE  Discontinued    Cancer Screenings: Lung: Low Dose CT Chest recommended if Age 3-80 years, 30 pack-year currently smoking OR have quit w/in 15years. Patient does not qualify. Breast:  Up to date on Mammogram? Yes   Up to date of Bone Density/Dexa? Yes Colorectal:Up to date   Additional Screenings: : Hepatitis C Screening: Low Risk      Plan:   -  Decline Shingrix vaccine due to cost   I have personally reviewed and noted the following in the patient's chart:   . Medical and social history . Use of alcohol, tobacco or illicit drugs  . Current medications and supplements . Functional ability and status . Nutritional status . Physical activity . Advanced directives . List of other physicians . Hospitalizations, surgeries, and ER visits in previous 12 months . Vitals . Screenings to include cognitive, depression, and falls . Referrals and appointments  In addition, I have reviewed and discussed with patient certain preventive protocols, quality metrics, and best practice  recommendations. A written personalized care plan for preventive services as well as general preventive health recommendations were provided to patient.     Sandrea Hughs, NP  01/14/2020

## 2020-01-14 NOTE — Progress Notes (Signed)
    This service is provided via telemedicine  No vital signs collected/recorded due to the encounter was a telemedicine visit.   Location of patient (ex: home, work): Home.  Patient consents to a telephone visit: Yes.  Location of the provider (ex: office, home):  [facility].  Name of any referring provider: N/A  Names of all persons participating in the telemedicine service and their role in the encounter:  Patient, Jasmine Dillard, RMA, Ngetich, Dinah, NP.    Time spent on call: 8 minutes spent on the phone with Medical Assistant.   

## 2020-01-14 NOTE — Telephone Encounter (Signed)
Patient requested refill [place] Database Verified LR: 12/16/2019 Pended Rx and sent to Dr. Mariea Clonts for approval.

## 2020-01-14 NOTE — Patient Instructions (Signed)
Katherine Delgado , Thank you for taking time to come for your Medicare Wellness Visit. I appreciate your ongoing commitment to your health goals. Please review the following plan we discussed and let me know if I can assist you in the future.   Screening recommendations/referrals: Colonoscopy: Up to date  Mammogram : Up to date  Bone Density : Up to date  Recommended yearly ophthalmology/optometry visit for glaucoma screening and checkup Recommended yearly dental visit for hygiene and checkup  Vaccinations: Influenza vaccine : Up to date  Pneumococcal vaccine : Up to date  Tdap vaccine : Up to date due 05/28/2026  Shingles vaccine: Declined due to cost   Advanced directives: Yes   Conditions/risks identified: Advance Age female > 50 Yrs,Type 2 DM,BMI> 30,Hypertension and Hx of smoking   Next appointment: 1 year    Preventive Care 79 Years and Older, Female Preventive care refers to lifestyle choices and visits with your health care provider that can promote health and wellness. What does preventive care include?  A yearly physical exam. This is also called an annual well check.  Dental exams once or twice a year.  Routine eye exams. Ask your health care provider how often you should have your eyes checked.  Personal lifestyle choices, including:  Daily care of your teeth and gums.  Regular physical activity.  Eating a healthy diet.  Avoiding tobacco and drug use.  Limiting alcohol use.  Practicing safe sex.  Taking low-dose aspirin every day.  Taking vitamin and mineral supplements as recommended by your health care provider. What happens during an annual well check? The services and screenings done by your health care provider during your annual well check will depend on your age, overall health, lifestyle risk factors, and family history of disease. Counseling  Your health care provider may ask you questions about your:  Alcohol use.  Tobacco use.  Drug  use.  Emotional well-being.  Home and relationship well-being.  Sexual activity.  Eating habits.  History of falls.  Memory and ability to understand (cognition).  Work and work Statistician.  Reproductive health. Screening  You may have the following tests or measurements:  Height, weight, and BMI.  Blood pressure.  Lipid and cholesterol levels. These may be checked every 5 years, or more frequently if you are over 8 years old.  Skin check.  Lung cancer screening. You may have this screening every year starting at age 58 if you have a 30-pack-year history of smoking and currently smoke or have quit within the past 15 years.  Fecal occult blood test (FOBT) of the stool. You may have this test every year starting at age 96.  Flexible sigmoidoscopy or colonoscopy. You may have a sigmoidoscopy every 5 years or a colonoscopy every 10 years starting at age 34.  Hepatitis C blood test.  Hepatitis B blood test.  Sexually transmitted disease (STD) testing.  Diabetes screening. This is done by checking your blood sugar (glucose) after you have not eaten for a while (fasting). You may have this done every 1-3 years.  Bone density scan. This is done to screen for osteoporosis. You may have this done starting at age 61.  Mammogram. This may be done every 1-2 years. Talk to your health care provider about how often you should have regular mammograms. Talk with your health care provider about your test results, treatment options, and if necessary, the need for more tests. Vaccines  Your health care provider may recommend certain vaccines, such as:  Influenza vaccine. This is recommended every year.  Tetanus, diphtheria, and acellular pertussis (Tdap, Td) vaccine. You may need a Td booster every 10 years.  Zoster vaccine. You may need this after age 37.  Pneumococcal 13-valent conjugate (PCV13) vaccine. One dose is recommended after age 43.  Pneumococcal polysaccharide  (PPSV23) vaccine. One dose is recommended after age 27. Talk to your health care provider about which screenings and vaccines you need and how often you need them. This information is not intended to replace advice given to you by your health care provider. Make sure you discuss any questions you have with your health care provider. Document Released: 09/04/2015 Document Revised: 04/27/2016 Document Reviewed: 06/09/2015 Elsevier Interactive Patient Education  2017 [place] Prevention in the Home Falls can cause injuries. They can happen to people of all ages. There are many things you can do to make your home safe and to help prevent falls. What can I do on the outside of my home?  Regularly fix the edges of walkways and driveways and fix any cracks.  Remove anything that might make you trip as you walk through a door, such as a raised step or threshold.  Trim any bushes or trees on the path to your home.  Use bright outdoor lighting.  Clear any walking paths of anything that might make someone trip, such as rocks or tools.  Regularly check to see if handrails are loose or broken. Make sure that both sides of any steps have handrails.  Any raised decks and porches should have guardrails on the edges.  Have any leaves, snow, or ice cleared regularly.  Use sand or salt on walking paths during winter.  Clean up any spills in your garage right away. This includes oil or grease spills. What can I do in the bathroom?  Use night lights.  Install grab bars by the toilet and in the tub and shower. Do not use towel bars as grab bars.  Use non-skid mats or decals in the tub or shower.  If you need to sit down in the shower, use a plastic, non-slip stool.  Keep the floor dry. Clean up any water that spills on the floor as soon as it happens.  Remove soap buildup in the tub or shower regularly.  Attach bath mats securely with double-sided non-slip rug tape.  Do not have  throw rugs and other things on the floor that can make you trip. What can I do in the bedroom?  Use night lights.  Make sure that you have a light by your bed that is easy to reach.  Do not use any sheets or blankets that are too big for your bed. They should not hang down onto the floor.  Have a firm chair that has side arms. You can use this for support while you get dressed.  Do not have throw rugs and other things on the floor that can make you trip. What can I do in the kitchen?  Clean up any spills right away.  Avoid walking on wet floors.  Keep items that you use a lot in easy-to-reach places.  If you need to reach something above you, use a strong step stool that has a grab bar.  Keep electrical cords out of the way.  Do not use floor polish or wax that makes floors slippery. If you must use wax, use non-skid floor wax.  Do not have throw rugs and other things on the floor that  can make you trip. What can I do with my stairs?  Do not leave any items on the stairs.  Make sure that there are handrails on both sides of the stairs and use them. Fix handrails that are broken or loose. Make sure that handrails are as long as the stairways.  Check any carpeting to make sure that it is firmly attached to the stairs. Fix any carpet that is loose or worn.  Avoid having throw rugs at the top or bottom of the stairs. If you do have throw rugs, attach them to the floor with carpet tape.  Make sure that you have a light switch at the top of the stairs and the bottom of the stairs. If you do not have them, ask someone to add them for you. What else can I do to help prevent falls?  Wear shoes that:  Do not have high heels.  Have rubber bottoms.  Are comfortable and fit you well.  Are closed at the toe. Do not wear sandals.  If you use a stepladder:  Make sure that it is fully opened. Do not climb a closed stepladder.  Make sure that both sides of the stepladder are  locked into place.  Ask someone to hold it for you, if possible.  Clearly mark and make sure that you can see:  Any grab bars or handrails.  First and last steps.  Where the edge of each step is.  Use tools that help you move around (mobility aids) if they are needed. These include:  Canes.  Walkers.  Scooters.  Crutches.  Turn on the lights when you go into a dark area. Replace any light bulbs as soon as they burn out.  Set up your furniture so you have a clear path. Avoid moving your furniture around.  If any of your floors are uneven, fix them.  If there are any pets around you, be aware of where they are.  Review your medicines with your doctor. Some medicines can make you feel dizzy. This can increase your chance of falling. Ask your doctor what other things that you can do to help prevent falls. This information is not intended to replace advice given to you by your health care provider. Make sure you discuss any questions you have with your health care provider. Document Released: 06/04/2009 Document Revised: 01/14/2016 Document Reviewed: 09/12/2014 Elsevier Interactive Patient Education  2017 [employer].

## 2020-01-15 ENCOUNTER — Other Ambulatory Visit: Payer: Self-pay | Admitting: Internal Medicine

## 2020-01-22 ENCOUNTER — Other Ambulatory Visit: Payer: Self-pay | Admitting: Rheumatology

## 2020-01-22 ENCOUNTER — Encounter: Payer: Self-pay | Admitting: Gastroenterology

## 2020-01-22 NOTE — Progress Notes (Signed)
Office Visit Note  Patient: Katherine Delgado             Date of Birth: 06-Apr-1941           MRN: [id number]             PCP: Gayland Curry, DO Referring: Gayland Curry, DO Visit Date: 01/24/2020 Occupation: @GUAROCC @  Subjective:  Generalized pain   History of Present Illness: Katherine Delgado is a 79 y.o. female with history of seropositive rheumatoid arthritis, fibromyalgia, and osteoarthritis.  She is on xeljanz 5 mg 1 tablet by mouth once a day.  She is tolerating xeljanz without any side effects.  She has not had any recent infections.  She denies any recent rheumatoid arthritis flares.   She is having increased generalized pain and edema.  She has chronic pain and fatigue due to fibromyalgia.    Activities of Daily Living:  Patient reports joint stiffness all day  Patient Reports nocturnal pain.  Difficulty dressing/grooming: Denies Difficulty climbing stairs: Reports Difficulty getting out of chair: Reports Difficulty using hands for taps, buttons, cutlery, and/or writing: Reports  Review of Systems  Constitutional: Positive for fatigue.  HENT: Positive for mouth dryness. Negative for mouth sores and nose dryness.   Eyes: Negative for pain, itching, visual disturbance and dryness.  Respiratory: Negative for cough, hemoptysis, shortness of breath and difficulty breathing.   Cardiovascular: Positive for swelling in legs/feet. Negative for palpitations and hypertension.  Gastrointestinal: Negative for blood in stool, constipation and diarrhea.  Endocrine: Negative for increased urination.  Genitourinary: Negative for painful urination.  Musculoskeletal: Positive for arthralgias, joint pain, joint swelling, myalgias, muscle weakness, morning stiffness, muscle tenderness and myalgias.  Skin: Negative for pallor, rash, hair loss, nodules/bumps, redness, skin tightness, ulcers and sensitivity to sunlight.  Allergic/Immunologic: Negative for susceptible to infections.    Neurological: Negative for dizziness, numbness, headaches, memory loss and weakness.  Hematological: Negative for swollen glands.  Psychiatric/Behavioral: Positive for sleep disturbance. Negative for depressed mood and confusion. The patient is not nervous/anxious.     PMFS History:  Patient Active Problem List   Diagnosis Date Noted  . Primary osteoarthritis of right knee 07/25/2019  . Mild episode of recurrent major depressive disorder ([place]) 10/22/2018  . Wound dehiscence 08/17/2018  . Lumbar spinal stenosis 03/30/2018  . Rheumatoid arthritis involving both wrists with positive rheumatoid factor ([place]) 06/14/2016  . High risk medication use 06/14/2016  . Sjogren's syndrome ([place]) 06/14/2016  . Primary osteoarthritis of both knees 06/14/2016  . DDD (degenerative disc disease), lumbar 06/14/2016  . Hypothyroidism 06/14/2016  . Osteoporosis 06/14/2016  . Abnormality of gait 04/19/2016  . Type 2 diabetes mellitus with diabetic polyneuropathy, with long-term current use of insulin ([place]) 11/27/2015  . Diabetes mellitus without complication ([place]) 123456  . Headache(784.0) 07/31/2013  . Sinus infection 07/31/2013  . Chronic right-sided low back pain with right-sided sciatica 03/14/2013  . Insomnia 12/06/2012  . Nausea alone 12/06/2012  . Diarrhea 12/06/2012  . Urinary incontinence, urge 12/06/2012  . Lumbosacral root lesions, not elsewhere classified 11/27/2012  . Diabetic polyneuropathy associated with type 2 diabetes mellitus ([place]) 11/27/2012  . EDEMA 05/04/2010  . YEAST INFECTION 05/03/2010  . Hyperlipidemia 05/03/2010  . DEPRESSION 05/03/2010  . History of peripheral neuropathy 05/03/2010  . Essential hypertension 05/03/2010  . ALLERGIC RHINITIS 05/03/2010  . PNEUMONIA 05/03/2010  . COPD (chronic obstructive pulmonary disease) ([place]) 05/03/2010  . GERD 05/03/2010  . Fibromyalgia 05/03/2010  . DYSPNEA 05/03/2010  .  CHEST PAIN 05/03/2010    Past Medical History:   Diagnosis Date  . Abnormality of gait 04/19/2016  . Acute infective polyneuritis ([place]) 2002  . Allergic rhinitis due to pollen   . Chronic pain syndrome   . COPD (chronic obstructive pulmonary disease) ([facility])    chronic bronchitis  . Depressive disorder, not elsewhere classified   . Diabetes mellitus without complication ([place])   . Diaphragmatic hernia without mention of obstruction or gangrene   . Dyslipidemia   . Fibromyalgia   . GERD (gastroesophageal reflux disease)    with h/o esophagitis  . Guillain-Barre syndrome ([place])   . History of benign thymus tumor   . Insomnia, unspecified   . Lumbar spinal stenosis 03/30/2018   L4-5 level, severe  . Miscarriage 1962  . Mixed hyperlipidemia   . Morbid obesity ([place])   . Osteoporosis   . Pneumonia   . Polyneuropathy in diabetes(357.2)   . Rheumatoid arthritis(714.0)   . Spondylosis, lumbosacral   . Spontaneous ecchymoses   . Type II or unspecified type diabetes mellitus with peripheral circulatory disorders, uncontrolled(250.72)   . Unspecified chronic bronchitis ([place])   . Unspecified essential hypertension   . Unspecified hypothyroidism   . Unspecified pruritic disorder   . Unspecified urinary incontinence     Family History  Problem Relation Age of Onset  . Alzheimer's disease Mother   . Heart disease Mother   . Heart disease Father   . Liver disease Father   . Cancer Brother   . Arthritis Son   . Colon polyps Brother   . Colon cancer Neg Hx   . Esophageal cancer Neg Hx   . Kidney disease Neg Hx   . Stomach cancer Neg Hx   . Rectal cancer Neg Hx    Past Surgical History:  Procedure Laterality Date  . ABDOMINAL HYSTERECTOMY  1974  . abdominal tumor  2002  . APPENDECTOMY    . APPLICATION OF A-CELL OF BACK N/A 09/26/2018   Procedure: With Acell;  Surgeon: Wallace Going, DO;  Location: [facility];  Service: Plastics;  Laterality: N/A;  . APPLICATION OF WOUND VAC N/A 09/26/2018   Procedure: Vac placement;  Surgeon:  Wallace Going, DO;  Location: [facility];  Service: Plastics;  Laterality: N/A;  . [place]  . BRONCHOSCOPY  2001  . CATARACT EXTRACTION, BILATERAL  09/2019, 10/2019  . CHOLECYSTECTOMY  1984  . INCISION AND DRAINAGE OF WOUND N/A 09/26/2018   Procedure: Debridement of spine wound;  Surgeon: Wallace Going, DO;  Location: [facility];  Service: Plastics;  Laterality: N/A;  . KNEE SURGERY Bilateral 08/27/2010 (L) and 01/11/2011 (R)  . LUMBAR LAMINECTOMY/DECOMPRESSION MICRODISCECTOMY N/A 07/03/2018   Procedure: Lumbar Three to Lumbar Five Laminectomy;  Surgeon: Judith Part, MD;  Location: [facility];  Service: Neurosurgery;  Laterality: N/A;  . LUMBAR WOUND DEBRIDEMENT N/A 08/20/2018   Procedure: POSTERIOR LUMBAR SPINAL WOUND DEBRIDEMENT AND REVISION;  Surgeon: Judith Part, MD;  Location: [facility];  Service: Neurosurgery;  Laterality: N/A;  POSTERIOR LUMBAR SPINAL WOUND REVISION  . [place]  . OVARIAN CYST SURGERY  1968  . thymus tumor    . 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 TONSILLECTOMY     Social History   Social History Narrative   Walks with cane   Right handed    Caffeine use: Coffee (2 cups every morning)   Tea: sometimes   Soda: none   Immunization History  Administered Date(s)  Administered  . Influenza-Unspecified 06/10/2011  . PFIZER SARS-COV-2 Vaccination 11/13/2019  . Pneumococcal Conjugate-13 07/07/2016  . Pneumococcal Polysaccharide-23 11/21/2003, 10/05/2017  . Tdap 05/28/2016     Objective: Vital Signs: BP 133/75 (BP Location: Right Arm, Patient Position: Sitting, Cuff Size: Normal)   Pulse 60   Ht 5\' 6"  (1.676 m)   Wt 239 lb 12.8 oz (108.8 kg)   BMI 38.70 kg/m    Physical Exam Vitals and nursing note reviewed.  Constitutional:      Appearance: She is well-developed.  HENT:     Head: Normocephalic and atraumatic.  Eyes:     Conjunctiva/sclera: Conjunctivae normal.  Pulmonary:     Effort: Pulmonary effort is normal.  Abdominal:      General: Bowel sounds are normal.     Palpations: Abdomen is soft.  Musculoskeletal:     Cervical back: Normal range of motion.  Lymphadenopathy:     Cervical: No cervical adenopathy.  Skin:    General: Skin is warm and dry.     Capillary Refill: Capillary refill takes less than 2 seconds.  Neurological:     Mental Status: She is alert and oriented to person, place, and time.  Psychiatric:        Behavior: Behavior normal.      Musculoskeletal Exam: C-spine limited ROM with lateral rotation.  Thoracic kyphosis noted. Unable to assess lumbar ROM while in wheelchair.  Shoulder joints and elbow joints good ROM.  Limited ROM of both wrist joints.  Synovial thickening of MCP joints but no synovitis.  CMC joint thickening bilaterally.  Knee joints good ROM.  No warmth or effusion of knee joints.  No tenderness of ankle joints.  Pedal edema bilaterally.   CDAI Exam: CDAI Score: -- Patient Global: --; Provider Global: -- Swollen: --; Tender: -- Joint Exam 01/24/2020   No joint exam has been documented for this visit   There is currently no information documented on the homunculus. Go to the Rheumatology activity and complete the homunculus joint exam.  Investigation: No additional findings.  Imaging: No results found.  Recent Labs: Lab Results  Component Value Date   WBC 14.8 (H) 09/02/2019   HGB 13.9 09/02/2019   PLT 373 09/02/2019   NA 141 09/02/2019   K 4.6 09/02/2019   CL 103 09/02/2019   CO2 31 09/02/2019   GLUCOSE 122 (H) 09/02/2019   BUN 24 09/02/2019   CREATININE 0.97 (H) 09/02/2019   BILITOT 0.3 08/02/2019   ALKPHOS 113 02/20/2017   AST 10 08/02/2019   ALT 7 08/02/2019   PROT 6.7 08/02/2019   ALBUMIN 2.6 (L) 08/22/2018   CALCIUM 9.7 09/02/2019   GFRAA 83 08/02/2019   QFTBGOLDPLUS NEGATIVE 04/02/2019    Speciality Comments: No specialty comments available.  Procedures:  No procedures performed Allergies: Influenza vaccines, Penicillins, Statins, and  Sulfamethoxazole-trimethoprim   Assessment / Plan:     Visit Diagnoses: Rheumatoid arthritis involving multiple sites with positive rheumatoid factor ([place]): She has no tenderness or synovitis on exam.  She has not had any recent rheumatoid arthritis flares.  She is clinically doing well on Xeljanz 5 mg 1 tablet by mouth daily.  Her arthritis is well controlled on the reduced dose of Xeljanz.  She was previously having recurrent infections but has not had any infections recently.  She has chronic pain and generalized fatigue due to underlying osteoarthritis and fibromyalgia.  Referral to pain management was placed today.  She does not require more aggressive treatment for rheumatoid  arthritis at this time.  She was advised to notify us if she develops increased joint inflammation.  She will follow-up in the office in 5 months.  High risk medication use - Xeljanz 5 mg 1 tablet daily. D/c Arava-SE of diarrhea, d/c MTX due to recurent infections.  She has not had any recent infections.  CBC and BMP were drawn on 09/02/2019.  She is overdue to update lab work.  Orders for CBC and CMP were released today.  She will return for lab work in September and every 3 months to monitor for drug toxicity.  TB Gold was negative on 04/02/2019.  A future order for TB Gold was placed today.- Plan: CBC with Differential/Platelet, COMPLETE METABOLIC PANEL WITH GFR, QuantiFERON-TB Gold Plus  Sjogren's syndrome with keratoconjunctivitis sicca ([place]): She has mouth dryness but no eye dryness.  Her symptoms have been tolerable.  Fibromyalgia: She has generalized hyperalgesia and positive tender points on exam.  She has been having increased myalgias and muscle tenderness due to underlying fibromyalgia.  She has been having increased generalized pain which has not been well managed.  She is also been having increased fatigue.  She has been taking melatonin 10 mg by mouth at bedtime to help her sleep.  She has difficulty exercising due  to being primarily wheelchair-bound.  Referral to pain management placed today.  Primary osteoarthritis of both knees: Chronic pain.  She has good ROM of both knee joints.  No warmth or effusion was noted.  Referral to pain management placed today.  DDD (degenerative disc disease), lumbar: Chronic pain.  She is primarily wheelchair bound due to the discomfort in her lower back and both knees.  Referral to pain management placed today.  Age-related osteoporosis without current pathological fracture: Future for DEXA is in place.  She continues to take a vitamin D supplement.   Other insomnia: She takes melatonin 10 mg po at bedtime.   Other medical conditions are listed as follows:   History of gastroesophageal reflux (GERD)  History of Guillain-Barre syndrome  History of peripheral neuropathy  History of diabetes mellitus  History of COPD  History of depression  History of hypertension  Orders: Orders Placed This Encounter  Procedures  . CBC with Differential/Platelet  . COMPLETE METABOLIC PANEL WITH GFR  . QuantiFERON-TB Gold Plus  . Ambulatory referral to Pain Clinic   Meds ordered this encounter  Medications  . Tofacitinib Citrate (XELJANZ) 5 MG TABS    Sig: Take 1 tablet (5 mg total) by mouth daily.    Dispense:  60 tablet    Refill:  1      Follow-Up Instructions: Return in about 5 months (around 06/25/2020) for Rheumatoid arthritis, Sjogren's syndrome, Osteoarthritis, Fibromyalgia.   [name], PA-C  I examined and evaluated the patient with [name] PA.  Patient has severe end-stage shoulder arthritis and osteoarthritis.  She continues to have generalized pain and discomfort.  She does not have any active synovitis on my examination today.  We will refer her to pain management.  The plan of care was discussed as noted above.  Bo Merino, MD  Note - This record has been created using Editor, commissioning.  Chart creation errors have been sought, but may  not always  have been located. Such creation errors do not reflect on  the standard of medical care.

## 2020-01-23 ENCOUNTER — Telehealth: Payer: Self-pay

## 2020-01-23 ENCOUNTER — Other Ambulatory Visit: Payer: Self-pay | Admitting: Internal Medicine

## 2020-01-23 MED ORDER — TRIAMTERENE-HCTZ 37.5-25 MG PO TABS: 1.0000 | ORAL_TABLET | Freq: Every day | ORAL | 1 refills | Status: DC

## 2020-01-23 NOTE — Telephone Encounter (Signed)
Patient called and wanted a refill on triamterene-hydrochlorothiazide (MAXZIDE-25) 37.5-25 MG tablet which was last fille o 08/27/2019 and her last visit was 08/2019. Refill sent

## 2020-01-24 ENCOUNTER — Other Ambulatory Visit: Payer: Self-pay

## 2020-01-24 ENCOUNTER — Ambulatory Visit (INDEPENDENT_AMBULATORY_CARE_PROVIDER_SITE_OTHER): Payer: Medicare Other | Admitting: Rheumatology

## 2020-01-24 ENCOUNTER — Encounter: Payer: Self-pay | Admitting: Rheumatology

## 2020-01-24 VITALS — BP 133/75 | HR 60 | Ht 66.0 in | Wt 239.8 lb

## 2020-01-24 DIAGNOSIS — Z8669 Personal history of other diseases of the nervous system and sense organs: Secondary | ICD-10-CM | POA: Diagnosis not present

## 2020-01-24 DIAGNOSIS — M810 Age-related osteoporosis without current pathological fracture: Secondary | ICD-10-CM | POA: Diagnosis not present

## 2020-01-24 DIAGNOSIS — M5136 Other intervertebral disc degeneration, lumbar region: Secondary | ICD-10-CM | POA: Diagnosis not present

## 2020-01-24 DIAGNOSIS — Z8659 Personal history of other mental and behavioral disorders: Secondary | ICD-10-CM

## 2020-01-24 DIAGNOSIS — M170 Bilateral primary osteoarthritis of knee: Secondary | ICD-10-CM | POA: Diagnosis not present

## 2020-01-24 DIAGNOSIS — Z79899 Other long term (current) drug therapy: Secondary | ICD-10-CM

## 2020-01-24 DIAGNOSIS — G4709 Other insomnia: Secondary | ICD-10-CM | POA: Diagnosis not present

## 2020-01-24 DIAGNOSIS — Z8709 Personal history of other diseases of the respiratory system: Secondary | ICD-10-CM

## 2020-01-24 DIAGNOSIS — M797 Fibromyalgia: Secondary | ICD-10-CM

## 2020-01-24 DIAGNOSIS — Z8639 Personal history of other endocrine, nutritional and metabolic disease: Secondary | ICD-10-CM | POA: Diagnosis not present

## 2020-01-24 DIAGNOSIS — M0579 Rheumatoid arthritis with rheumatoid factor of multiple sites without organ or systems involvement: Secondary | ICD-10-CM | POA: Diagnosis not present

## 2020-01-24 DIAGNOSIS — Z8679 Personal history of other diseases of the circulatory system: Secondary | ICD-10-CM

## 2020-01-24 DIAGNOSIS — Z8719 Personal history of other diseases of the digestive system: Secondary | ICD-10-CM | POA: Diagnosis not present

## 2020-01-24 DIAGNOSIS — M3501 Sicca syndrome with keratoconjunctivitis: Secondary | ICD-10-CM

## 2020-01-24 MED ORDER — XELJANZ 5 MG PO TABS: 1.0000 | ORAL_TABLET | Freq: Every day | ORAL | 1 refills | Status: DC

## 2020-01-24 NOTE — Patient Instructions (Signed)
Standing Labs We placed an order today for your standing lab work.    Please come back and get your standing labs in September and every 3 months   We have open lab daily Monday through Thursday from 8:30-12:30 PM and 1:30-4:30 PM and Friday from 8:30-12:30 PM and 1:30-4:00 PM at the office of Dr. Bo Merino.   You may experience shorter wait times on Monday and Friday afternoons. The office is located at [address] [place], [place], [place] [address] No appointment is necessary.   Labs are drawn by [facility].  You may receive a bill from [facility] for your lab work.  If you wish to have your labs drawn at another location, please call the office 24 hours in advance to send orders.  If you have any questions regarding directions or hours of operation,  please call [phone].   Just as a reminder please drink plenty of water prior to coming for your lab work. Thanks!

## 2020-01-25 LAB — CBC WITH DIFFERENTIAL/PLATELET
Absolute Monocytes: 897 cells/uL (ref 200–950)
Basophils Absolute: 130 cells/uL (ref 0–200)
Basophils Relative: 0.8 %
Eosinophils Absolute: 424 cells/uL (ref 15–500)
Eosinophils Relative: 2.6 %
HCT: 42.2 % (ref 35.0–45.0)
Hemoglobin: 13.9 g/dL (ref 11.7–15.5)
Lymphs Abs: 3733 cells/uL (ref 850–3900)
MCH: 27.9 pg (ref 27.0–33.0)
MCHC: 32.9 g/dL (ref 32.0–36.0)
MCV: 84.6 fL (ref 80.0–100.0)
MPV: 10.6 fL (ref 7.5–12.5)
Monocytes Relative: 5.5 %
Neutro Abs: 11117 cells/uL — ABNORMAL HIGH (ref 1500–7800)
Neutrophils Relative %: 68.2 %
Platelets: 368 10*3/uL (ref 140–400)
RBC: 4.99 10*6/uL (ref 3.80–5.10)
RDW: 13.1 % (ref 11.0–15.0)
Total Lymphocyte: 22.9 %
WBC: 16.3 10*3/uL — ABNORMAL HIGH (ref 3.8–10.8)

## 2020-01-25 LAB — COMPLETE METABOLIC PANEL WITH GFR
AG Ratio: 1.5 (calc) (ref 1.0–2.5)
ALT: 9 U/L (ref 6–29)
AST: 15 U/L (ref 10–35)
Albumin: 4 g/dL (ref 3.6–5.1)
Alkaline phosphatase (APISO): 91 U/L (ref 37–153)
BUN: 16 mg/dL (ref 7–25)
CO2: 29 mmol/L (ref 20–32)
Calcium: 9.5 mg/dL (ref 8.6–10.4)
Chloride: 104 mmol/L (ref 98–110)
Creat: 0.84 mg/dL (ref 0.60–0.93)
GFR, Est African American: 77 mL/min/{1.73_m2} (ref >=60)
GFR, Est Non African American: 66 mL/min/{1.73_m2} (ref >=60)
Globulin: 2.7 g/dL (calc) (ref 1.9–3.7)
Glucose, Bld: 80 mg/dL (ref 65–99)
Potassium: 4.1 mmol/L (ref 3.5–5.3)
Sodium: 143 mmol/L (ref 135–146)
Total Bilirubin: 0.3 mg/dL (ref 0.2–1.2)
Total Protein: 6.7 g/dL (ref 6.1–8.1)

## 2020-01-26 ENCOUNTER — Other Ambulatory Visit: Payer: Self-pay | Admitting: Internal Medicine

## 2020-01-27 ENCOUNTER — Encounter: Payer: Self-pay | Admitting: Rheumatology

## 2020-01-27 ENCOUNTER — Other Ambulatory Visit: Payer: Self-pay | Admitting: Internal Medicine

## 2020-01-27 NOTE — Telephone Encounter (Signed)
rx sent to pharmacy by e-script  

## 2020-01-27 NOTE — Progress Notes (Signed)
WBC count is chronically elevated.  Absolute neutrophils elevated but stable.  Please forward labs to PCP.  CMP WNL.

## 2020-02-03 ENCOUNTER — Other Ambulatory Visit: Payer: Self-pay | Admitting: Internal Medicine

## 2020-02-05 ENCOUNTER — Other Ambulatory Visit: Payer: Self-pay

## 2020-02-05 ENCOUNTER — Encounter
Payer: Medicare Other | Attending: Physical Medicine and Rehabilitation | Admitting: Physical Medicine and Rehabilitation

## 2020-02-05 VITALS — BP 131/74 | HR 63 | Temp 98.2°F | Ht 66.0 in | Wt 236.4 lb

## 2020-02-05 DIAGNOSIS — G4701 Insomnia due to medical condition: Secondary | ICD-10-CM | POA: Insufficient documentation

## 2020-02-05 DIAGNOSIS — Z79891 Long term (current) use of opiate analgesic: Secondary | ICD-10-CM | POA: Diagnosis not present

## 2020-02-05 DIAGNOSIS — Z5181 Encounter for therapeutic drug level monitoring: Secondary | ICD-10-CM

## 2020-02-05 DIAGNOSIS — Z6838 Body mass index (BMI) 38.0-38.9, adult: Secondary | ICD-10-CM

## 2020-02-05 DIAGNOSIS — N3941 Urge incontinence: Secondary | ICD-10-CM | POA: Diagnosis present

## 2020-02-05 DIAGNOSIS — K5900 Constipation, unspecified: Secondary | ICD-10-CM | POA: Diagnosis present

## 2020-02-05 DIAGNOSIS — G894 Chronic pain syndrome: Secondary | ICD-10-CM

## 2020-02-05 NOTE — Progress Notes (Signed)
Subjective:    Patient ID: Katherine Delgado, female    DOB: 02-11-1941, 79 y.o.   MRN: [id number]  HPI  Katherine Delgado is a 79 year old woman with rheumatoid arthritis, fibromyalgia and diffuse body pains. It often radiates from one area to another. She takes a medication for RA which she does find to be helpful.   She also takes Tizanidine twice per day. It does not make her sleepy. She has been on it for several months.   Currently her pain is 8/10.  She has difficulty sleeping at night. She takes melatonin to help her sleep at night. She only sleeps 1 hr at a time at night.   She is not able to walk much without a cane or walker or without holding onto something. She does not do as much washing dishes or cooking anymore.   She has a history of three back surgeries.   Her son helps a lot with housecleaning and grocery shopping as it is not easy to be mobile.   BP is 131/74. She is on amlodipine 10mg  daily.   She takes Gabapentin 600mg  for her fibromyalgia. She takes Oxycodone 5mg  and she receives 90 per month. She has been taking three per day. These medications don't last very long.  She has had a lot of problems with her knees and back.   Pain Inventory Average Pain 8 Pain Right Now 9 My pain is sharp, burning, dull, stabbing, tingling and aching  In the last 24 hours, has pain interfered with the following? General activity 10 Relation with others 9 Enjoyment of life 10 What TIME of day is your pain at its worst? evening night Sleep (in general) Poor  Pain is worse with: walking, bending, standing and some activites Pain improves with: rest and pacing activities Relief from Meds: na  Mobility use a cane use a walker how many minutes can you walk? 5 ability to climb steps?  no do you drive?  no use a wheelchair needs help with transfers  Function disabled: date disabled 31 I need assistance with the following:  meal prep, household duties and  shopping  Neuro/Psych bladder control problems bowel control problems weakness numbness tingling trouble walking spasms dizziness  Prior Studies new pt  Physicians involved in your care new pt   Family History  Problem Relation Age of Onset   Alzheimer's disease Mother    Heart disease Mother    Heart disease Father    Liver disease Father    Cancer Brother    Arthritis Son    Colon polyps Brother    Colon cancer Neg Hx    Esophageal cancer Neg Hx    Kidney disease Neg Hx    Stomach cancer Neg Hx    Rectal cancer Neg Hx    Social History   Socioeconomic History   Marital status: Widowed    Spouse name: Not on file   Number of children: 2   Years of education: 14   Highest education level: Not on file  Occupational History   Occupation: Retired  Tobacco Use   Smoking status: Former Smoker    Packs/day: 0.10    Years: 10.00    Pack years: 1.00    Types: Cigarettes    Quit date: 12/07/1979    Years since quitting: 40.1   Smokeless tobacco: Never Used  Vaping Use   Vaping Use: Never used  Substance and Sexual Activity   Alcohol use: No    Alcohol/week:  0.0 standard drinks   Drug use: No   Sexual activity: Never  Other Topics Concern   Not on file  Social History Narrative   Walks with cane   Right handed    Caffeine use: Coffee (2 cups every morning)   Tea: sometimes   Soda: none   Social Determinants of Radio broadcast assistant Strain:    Difficulty of Paying Living Expenses:   Food Insecurity:    Worried About Charity fundraiser in the Last Year:    Arboriculturist in the Last Year:   Transportation Needs:    Film/video editor (Medical):    Lack of Transportation (Non-Medical):   Physical Activity:    Days of Exercise per Week:    Minutes of Exercise per Session:   Stress:    Feeling of Stress :   Social Connections:    Frequency of Communication with Friends and Family:    Frequency of  Social Gatherings with Friends and Family:    Attends Religious Services:    Active Member of Clubs or Organizations:    Attends Archivist Meetings:    Marital Status:    Past Surgical History:  Procedure Laterality Date   ABDOMINAL HYSTERECTOMY  1974   abdominal tumor  2002   APPENDECTOMY     APPLICATION OF A-CELL OF BACK N/A 09/26/2018   Procedure: With Acell;  Surgeon: Wallace Going, DO;  Location: [place];  Service: Plastics;  Laterality: N/A;   APPLICATION OF WOUND VAC N/A 09/26/2018   Procedure: Vac placement;  Surgeon: Wallace Going, DO;  Location: [place];  Service: Plastics;  Laterality: N/A;   BACK SURGERY  1982   BRONCHOSCOPY  2001   CATARACT EXTRACTION, BILATERAL  09/2019, 10/2019   CHOLECYSTECTOMY  1984   INCISION AND DRAINAGE OF WOUND N/A 09/26/2018   Procedure: Debridement of spine wound;  Surgeon: Wallace Going, DO;  Location: [place];  Service: Plastics;  Laterality: N/A;   KNEE SURGERY Bilateral 08/27/2010 (L) and 01/11/2011 (R)   LUMBAR LAMINECTOMY/DECOMPRESSION MICRODISCECTOMY N/A 07/03/2018   Procedure: Lumbar Three to Lumbar Five Laminectomy;  Surgeon: Judith Part, MD;  Location: [place];  Service: Neurosurgery;  Laterality: N/A;   LUMBAR WOUND DEBRIDEMENT N/A 08/20/2018   Procedure: POSTERIOR LUMBAR SPINAL WOUND DEBRIDEMENT AND REVISION;  Surgeon: Judith Part, MD;  Location: [place];  Service: Neurosurgery;  Laterality: N/A;  POSTERIOR LUMBAR SPINAL WOUND REVISION   miscarrage  1962   OVARIAN CYST SURGERY  1968   thymus tumor     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   TONSILLECTOMY     Past Medical History:  Diagnosis Date   Abnormality of gait 04/19/2016   Acute infective polyneuritis ([place]) 2002   Allergic rhinitis due to pollen    Chronic pain syndrome    COPD (chronic obstructive pulmonary disease) (HCC)    chronic bronchitis   Depressive disorder, not elsewhere classified    Diabetes mellitus without  complication ([place])    Diaphragmatic hernia without mention of obstruction or gangrene    Dyslipidemia    Fibromyalgia    GERD (gastroesophageal reflux disease)    with h/o esophagitis   Guillain-Barre syndrome (HCC)    History of benign thymus tumor    Insomnia, unspecified    Lumbar spinal stenosis 03/30/2018   L4-5 level, severe   Miscarriage 1962   Mixed hyperlipidemia    Morbid obesity ([place])    Osteoporosis  Pneumonia    Polyneuropathy in diabetes(357.2)    Rheumatoid arthritis(714.0)    Spondylosis, lumbosacral    Spontaneous ecchymoses    Type II or unspecified type diabetes mellitus with peripheral circulatory disorders, uncontrolled(250.72)    Unspecified chronic bronchitis (HCC)    Unspecified essential hypertension    Unspecified hypothyroidism    Unspecified pruritic disorder    Unspecified urinary incontinence    BP 131/74    Pulse 63    Temp 98.2 F (36.8 C)    Ht 5\' 6"  (1.676 m)    Wt 236 lb 6.4 oz (107.2 kg)    SpO2 95%    BMI 38.16 kg/m   Opioid Risk Score:   Fall Risk Score:  `1  Depression screen PHQ 2/9  Depression screen [facility] 2/9 02/05/2020 01/14/2020 09/11/2019 04/18/2019 01/11/2019 12/10/2018 10/22/2018  Decreased Interest 3 0 0 0 0 0 0  Down, Depressed, Hopeless 0 0 0 0 0 0 0  PHQ - 2 Score 3 0 0 0 0 0 0  Altered sleeping 3 - - - - - -  Tired, decreased energy 2 - - - - - -  Change in appetite 2 - - - - - -  Feeling bad or failure about yourself  0 - - - - - -  Trouble concentrating 1 - - - - - -  Moving slowly or fidgety/restless 0 - - - - - -  Suicidal thoughts 0 - - - - - -  PHQ-9 Score 11 - - - - - -  Difficult doing work/chores Extremely dIfficult - - - - - -  Some recent data might be hidden    Review of Systems  Constitutional: Positive for diaphoresis, fever and unexpected weight change.  Respiratory: Positive for apnea, shortness of breath and wheezing.   Gastrointestinal: Positive for abdominal pain, constipation  and diarrhea.  All other systems reviewed and are negative.      Objective:   Physical Exam Gen: no distress, normal appearing HEENT: oral mucosa pink and moist, NCAT Cardio: Reg rate Chest: normal effort, normal rate of breathing Abd: soft, non-distended Ext: no edema Skin: intact Neuro: Musculoskeletal: Psych: pleasant, normal affect    Assessment & Plan:  Katherine Delgado is a 79 year old woman who presents with chronic pain syndrome and bilateral knee osteoarthritis, and severe insomnia.  1) Severe insomnia: Patient only sleeps 1 hour at a time at night. Will prescribe Amitriptyline 10mg  HS to aid in sleep. Discussed that improving sleep is necessary to enhance quality of life and allow body to repair itself. Will also help pain.  2) Chronic pain syndrome: Patient has severe bilateral OA that no longer benefits from injections. She is on Oxycodone but is developing tolerance to this. Discussed my recommendation of weaning off this medication. Discussed other options of increasing dose, but she will experience worse constipation and over time get tolerant to increased dose. Son suggested switching to Hydrocodone which provided her better pain relief before but this was prior to her surgeries when she was likely not yet tolerant to opioids. Patient would like [place] to take over pain management. Pain contract signed and UDS obtained. Discussed role of exercise and anti-inflammatory diet in pain relief. Recommended exercise bike given limited mobility and low carb diet.   3) Obesity class 2: current with is 236 lbs. Monitor each visit. Can contribute to chronic disease and worsening pain. Lifestyle changes as above.

## 2020-02-06 ENCOUNTER — Encounter: Payer: Self-pay | Admitting: Physical Medicine and Rehabilitation

## 2020-02-06 MED ORDER — AMITRIPTYLINE HCL 10 MG PO TABS: 10.0000 mg | ORAL_TABLET | Freq: Every day | ORAL | 3 refills | Status: DC

## 2020-02-09 LAB — DRUG TOX ALC METAB W/CON, ORAL FLD: Alcohol Metabolite: NEGATIVE ng/mL (ref <25)

## 2020-02-09 LAB — DRUG TOX MONITOR 1 W/CONF, ORAL FLD

## 2020-02-10 ENCOUNTER — Encounter: Payer: Self-pay | Admitting: Internal Medicine

## 2020-02-10 ENCOUNTER — Ambulatory Visit (INDEPENDENT_AMBULATORY_CARE_PROVIDER_SITE_OTHER): Payer: Medicare Other | Admitting: Internal Medicine

## 2020-02-10 ENCOUNTER — Telehealth: Payer: Self-pay | Admitting: *Deleted

## 2020-02-10 ENCOUNTER — Other Ambulatory Visit: Payer: Self-pay

## 2020-02-10 VITALS — BP 122/78 | HR 64 | Temp 98.1°F | Ht 66.0 in | Wt 247.0 lb

## 2020-02-10 DIAGNOSIS — E785 Hyperlipidemia, unspecified: Secondary | ICD-10-CM

## 2020-02-10 DIAGNOSIS — M797 Fibromyalgia: Secondary | ICD-10-CM

## 2020-02-10 DIAGNOSIS — E1142 Type 2 diabetes mellitus with diabetic polyneuropathy: Secondary | ICD-10-CM

## 2020-02-10 DIAGNOSIS — L84 Corns and callosities: Secondary | ICD-10-CM

## 2020-02-10 DIAGNOSIS — Z794 Long term (current) use of insulin: Secondary | ICD-10-CM

## 2020-02-10 DIAGNOSIS — D720 Genetic anomalies of leukocytes: Secondary | ICD-10-CM | POA: Insufficient documentation

## 2020-02-10 DIAGNOSIS — F5101 Primary insomnia: Secondary | ICD-10-CM | POA: Diagnosis not present

## 2020-02-10 DIAGNOSIS — J42 Unspecified chronic bronchitis: Secondary | ICD-10-CM

## 2020-02-10 DIAGNOSIS — E1169 Type 2 diabetes mellitus with other specified complication: Secondary | ICD-10-CM

## 2020-02-10 DIAGNOSIS — N3281 Overactive bladder: Secondary | ICD-10-CM

## 2020-02-10 LAB — GLUCOSE, POCT (MANUAL RESULT ENTRY): POC Glucose: 107 mg/dl — AB (ref 70–99)

## 2020-02-10 MED ORDER — BD PEN NEEDLE MINI U/F 31G X 5 MM MISC: 0 refills | Status: DC

## 2020-02-10 MED ORDER — FOLIC ACID 1 MG PO TABS: 2.0000 mg | ORAL_TABLET | Freq: Every day | ORAL | 0 refills | Status: DC

## 2020-02-10 MED ORDER — MAGNESIUM OXIDE 400 MG PO TABS: 400.0000 mg | ORAL_TABLET | Freq: Every day | ORAL | 0 refills | Status: DC | PRN

## 2020-02-10 NOTE — Progress Notes (Addendum)
Location:  [facility] clinic Provider:  Akane Tessier L. Mariea Clonts, D.O., C.M.D.  Code Status: DNR Goals of Care:  Advanced Directives 02/10/2020  Does Patient Have a Medical Advance Directive? Yes  Type of Advance Directive Out of facility DNR (pink MOST or yellow form)  Does patient want to make changes to medical advance directive? No - Patient declined  Copy of Wisdom in Chart? -  Would patient like information on creating a medical advance directive? -  Pre-existing out of facility DNR order (yellow form or pink MOST form) -     Chief Complaint  Patient presents with  . Medical Management of Chronic Issues    6 month follow up     HPI: Patient is a 78 y.o. female seen today for medical management of chronic diseases.    WBC 16.3.  Not on any steroids right now.  Hematology had advised monitoring.  No new open places on toes and back surgery long-healed.  Still has her left great toe pre-ulcerative callus.    Swelling is pretty good right now.  She says the best it's been for a while.  Uses lasix and potassium when needed for the swelling.  She had cataract surgery in feb/mar.  Both done.    Dr. Kathee Delton referred her to pain mgt--elavil was recommended in place of an opioid.  She said it would also help her sleep.  Pt reports she's not depressed.  Explained it was recommended for fibromyalgia pain.   She is going to start the elavil tonight.  She says she takes too much tylenol and says the 5mg  oxycodone is not doing it.  I agree with trying to get her off opioids.  She signed a controlled substance contract.  She says she has already used up all of her oxycodone.  I had been giving her 90 pills.  She's been out since last Thursday. Explained that she should have already gone through the withdrawal if she was going to have it.    Counseled that she needs to try the new medication--elavil--for her pain since she's entirely out of oxycodone.    Reviewed max tylenol is  3000 mg daily.  Also uses a little ibuprofen.  Reviewed not overdoing it either b/c of kidney function and diabetes.  Taking myrbetriq, but she does not think it's helping her bladder that much.  It's been worse ever since her back got worse.    Had breakfast this am.  Before that, CBG was 116.    Past Medical History:  Diagnosis Date  . Abnormality of gait 04/19/2016  . Acute infective polyneuritis ([place]) 2002  . Allergic rhinitis due to pollen   . Chronic pain syndrome   . COPD (chronic obstructive pulmonary disease) (HCC)    chronic bronchitis  . Depressive disorder, not elsewhere classified   . Diabetes mellitus without complication ([place])   . Diaphragmatic hernia without mention of obstruction or gangrene   . Dyslipidemia   . Fibromyalgia   . GERD (gastroesophageal reflux disease)    with h/o esophagitis  . Guillain-Barre syndrome ([place])   . History of benign thymus tumor   . Insomnia, unspecified   . Lumbar spinal stenosis 03/30/2018   L4-5 level, severe  . Miscarriage 1962  . Mixed hyperlipidemia   . Morbid obesity ([place])   . Osteoporosis   . Pneumonia   . Polyneuropathy in diabetes(357.2)   . Rheumatoid arthritis(714.0)   . Spondylosis, lumbosacral   . Spontaneous ecchymoses   .  Type II or unspecified type diabetes mellitus with peripheral circulatory disorders, uncontrolled(250.72)   . Unspecified chronic bronchitis ([place])   . Unspecified essential hypertension   . Unspecified hypothyroidism   . Unspecified pruritic disorder   . Unspecified urinary incontinence     Past Surgical History:  Procedure Laterality Date  . ABDOMINAL HYSTERECTOMY  1974  . abdominal tumor  2002  . APPENDECTOMY    . APPLICATION OF A-CELL OF BACK N/A 09/26/2018   Procedure: With Acell;  Surgeon: Wallace Going, DO;  Location: [place];  Service: Plastics;  Laterality: N/A;  . APPLICATION OF WOUND VAC N/A 09/26/2018   Procedure: Vac placement;  Surgeon: Wallace Going, DO;  Location: [place];  Service: Plastics;  Laterality: N/A;  . [place]  . BRONCHOSCOPY  2001  . CATARACT EXTRACTION, BILATERAL  09/2019, 10/2019  . CHOLECYSTECTOMY  1984  . INCISION AND DRAINAGE OF WOUND N/A 09/26/2018   Procedure: Debridement of spine wound;  Surgeon: Wallace Going, DO;  Location: [facility];  Service: Plastics;  Laterality: N/A;  . KNEE SURGERY Bilateral 08/27/2010 (L) and 01/11/2011 (R)  . LUMBAR LAMINECTOMY/DECOMPRESSION MICRODISCECTOMY N/A 07/03/2018   Procedure: Lumbar Three to Lumbar Five Laminectomy;  Surgeon: Judith Part, MD;  Location: [place];  Service: Neurosurgery;  Laterality: N/A;  . LUMBAR WOUND DEBRIDEMENT N/A 08/20/2018   Procedure: POSTERIOR LUMBAR SPINAL WOUND DEBRIDEMENT AND REVISION;  Surgeon: Judith Part, MD;  Location: [place];  Service: Neurosurgery;  Laterality: N/A;  POSTERIOR LUMBAR SPINAL WOUND REVISION  . [place]  . OVARIAN CYST SURGERY  1968  . thymus tumor    . 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 TONSILLECTOMY      Allergies  Allergen Reactions  . Influenza Vaccines Other (See Comments)    "h/o Guillain Barre; dr's told me years ago never to take another flu shot as it could relapse Rosalee Kaufman; has to do with when vaccine being changed to H1N1 virus"  . Penicillins Hives, Itching, Swelling and Rash    Has patient had a PCN reaction causing immediate rash, facial/tongue/throat swelling, SOB or lightheadedness with hypotension:Unknown Has patient had a PCN reaction causing severe rash involving mucus membranes or skin necrosis: Unknown Has patient had a PCN reaction that required hospitalization:Unknown Has patient had a PCN reaction occurring within the last 10 years: Unknown If all of the above answers are "NO", then may proceed with Cephalosporin use.   . Statins Other (See Comments)    Myopathy, transaminitis  . Sulfamethoxazole-Trimethoprim Itching    Outpatient Encounter Medications as of 02/10/2020  Medication Sig  .  acetaminophen (TYLENOL) 325 MG tablet Take 650 mg by mouth every 6 (six) hours as needed (for pain.).  Marland Kitchen amitriptyline (ELAVIL) 10 MG tablet Take 1 tablet (10 mg total) by mouth at bedtime.  Marland Kitchen amLODipine (NORVASC) 10 MG tablet Take 1 tablet (10 mg total) by mouth daily.  Marland Kitchen aspirin EC 81 MG tablet Take 81 mg by mouth 2 (two) times a week.  . Chlorphen-Phenyleph-ASA (ALKA-SELTZER PLUS COLD PO) Take by mouth as needed.  . cholecalciferol (VITAMIN D3) 25 MCG (1000 UT) tablet Take 1,000 Units by mouth daily.  Marland Kitchen docusate sodium (COLACE) 100 MG capsule Take 100 mg by mouth daily as needed (constipation.).   Marland Kitchen folic acid (FOLVITE) 1 MG tablet Take 2 tablets (2 mg total) by mouth daily.  . furosemide (LASIX) 20 MG tablet TAKE 1 TABLET BY MOUTH TWICE DAILY AS  NEEDED FOR 3 POUND WEIGHT GAIN IN ONE DAY OR 5 POUNDS IN ONE WEEK  . gabapentin (NEURONTIN) 600 MG tablet TAKE 1 TABLET(600 MG) BY MOUTH THREE TIMES DAILY  . Insulin Lispro (HUMALOG KWIKPEN) 200 UNIT/ML SOPN Inject 20 Units into the skin 3 (three) times daily.  . Insulin Pen Needle (B-D UF III MINI PEN NEEDLES) 31G X 5 MM MISC USE AS DIRECTED  . levothyroxine (SYNTHROID) 137 MCG tablet TAKE 1 TABLET BY MOUTH EVERY DAY 30 MINUTES BEFORE BREAKFAST  . lisinopril (ZESTRIL) 20 MG tablet TAKE 1 TABLET(20 MG) BY MOUTH DAILY  . loperamide (IMODIUM A-D) 2 MG tablet Take 2 mg by mouth 4 (four) times daily as needed for diarrhea or loose stools.  . magnesium oxide (MAG-OX) 400 MG tablet Take 1 tablet (400 mg total) by mouth daily as needed (for leg cramps).  . Melatonin 10 MG TABS Take 10 mg by mouth at bedtime.  . metoprolol tartrate (LOPRESSOR) 50 MG tablet TAKE 1 TABLET(50 MG) BY MOUTH TWICE DAILY  . Multiple Vitamins-Minerals (MULTIVITAMIN ADULT PO) Take 1 tablet by mouth daily.  Marland Kitchen MYRBETRIQ 50 MG TB24 tablet TAKE 1 TABLET(50 MG) BY MOUTH DAILY  . nystatin (NYSTATIN) powder APPLY TOPICALLY THREE TIMES DAILY AS DIRECTED FOR 14 DAYS  . nystatin cream  (MYCOSTATIN) Apply 1 application topically 2 (two) times daily. To skin folds  . omega-3 acid ethyl esters (LOVAZA) 1 g capsule TAKE ONE CAPSULE BY MOUTH EVERY DAY  . oxyCODONE (ROXICODONE) 5 MG immediate release tablet Take 1 tablet (5 mg total) by mouth every 6 (six) hours as needed for severe pain.  . polyvinyl alcohol (LIQUIFILM TEARS) 1.4 % ophthalmic solution Place 1 drop into both eyes 4 (four) times daily as needed (for dry/irritated eyes).   . potassium chloride (MICRO-K) 10 MEQ CR capsule Take 2 capsules (20 mEq total) by mouth 2 (two) times daily as needed (when taking a dose of lasix.).  Marland Kitchen Tofacitinib Citrate (XELJANZ) 5 MG TABS Take 1 tablet (5 mg total) by mouth daily.  . TOUJEO SOLOSTAR 300 UNIT/ML SOPN INJECT 100 UNITS INTO THE SKIN EVERY NIGHT AT BEDTIME  . triamterene-hydrochlorothiazide (MAXZIDE-25) 37.5-25 MG tablet TAKE 1 TABLET BY MOUTH DAILY  . umeclidinium-vilanterol (ANORO ELLIPTA) 62.5-25 MCG/INH AEPB Inhale 1 puff into the lungs daily.  Marland Kitchen venlafaxine XR (EFFEXOR-XR) 75 MG 24 hr capsule TAKE 1 CAPSULE BY MOUTH EVERY DAY WITH BREAKFAST  . VICTOZA 18 MG/3ML SOPN ADMINISTER 1.8 MG UNDER THE SKIN DAILY FOR BLOOD SUGAR  . [DISCONTINUED] B-D UF III MINI PEN NEEDLES 31G X 5 MM MISC USE AS DIRECTED  . [DISCONTINUED] folic acid (FOLVITE) 1 MG tablet Take 2 tablets (2 mg total) by mouth daily.  . [DISCONTINUED] magnesium oxide (MAG-OX) 400 MG tablet Take 1 tablet (400 mg total) by mouth daily as needed (for leg cramps).   No facility-administered encounter medications on file as of 02/10/2020.    Review of Systems:  Review of Systems  Constitutional: Negative for chills, fever and malaise/fatigue.  HENT: Negative for congestion.   Eyes: Negative for blurred vision.  Respiratory: Positive for cough and sputum production. Negative for shortness of breath.   Cardiovascular: Positive for leg swelling. Negative for chest pain, palpitations, orthopnea and PND.  Gastrointestinal:  Negative for abdominal pain, blood in stool, constipation, diarrhea and melena.  Genitourinary: Negative for dysuria.  Musculoskeletal: Positive for back pain, joint pain and myalgias. Negative for falls.  Skin: Negative for itching and rash.  Left great toe pre-ulcerative callus  Neurological: Positive for tingling and sensory change. Negative for dizziness and loss of consciousness.  Psychiatric/Behavioral: Negative for depression. The patient has insomnia. The patient is not nervous/anxious.     Health Maintenance  Topic Date Due  . HEMOGLOBIN A1C  10/10/2019  . FOOT EXAM  04/17/2020  . OPHTHALMOLOGY EXAM  10/02/2020  . DEXA SCAN  09/20/2025  . TETANUS/TDAP  05/28/2026  . COVID-19 Vaccine  Completed  . Hepatitis C Screening  Completed  . PNA vac Low Risk Adult  Completed  . INFLUENZA VACCINE  Discontinued    Physical Exam: Vitals:   02/10/20 0942  Pulse: 64  Temp: 98.1 F (36.7 C)  TempSrc: Temporal  SpO2: 97%  Weight: 247 lb (112 kg)  Height: 5\' 6"  (1.676 m)   Body mass index is 39.87 kg/m. Physical Exam Vitals reviewed.  Constitutional:      General: She is not in acute distress.    Appearance: Normal appearance. She is obese. She is not toxic-appearing.     Comments: Comes in manual wheelchair that's falling apart  HENT:     Head: Normocephalic and atraumatic.  Cardiovascular:     Rate and Rhythm: Normal rate and regular rhythm.     Pulses: Normal pulses.     Heart sounds: Normal heart sounds.  Pulmonary:     Effort: Pulmonary effort is normal.     Breath sounds: Normal breath sounds. No wheezing, rhonchi or rales.  Musculoskeletal:     Cervical back: Neck supple.     Right lower leg: Edema present.     Left lower leg: Edema present.  Skin:    General: Skin is warm and dry.     Comments: Left great pre-ulcerative callus  Neurological:     General: No focal deficit present.     Mental Status: She is alert and oriented to person, place, and time.      Sensory: Sensory deficit present.     Motor: Weakness present.     Gait: Gait abnormal.  Psychiatric:        Mood and Affect: Mood normal.        Behavior: Behavior normal.     Labs reviewed: Basic Metabolic Panel: Recent Labs    08/02/19 1514 09/02/19 0841 01/24/20 1148  NA 139 141 143  K 4.0 4.6 4.1  CL 99 103 104  CO2 27 31 29   GLUCOSE 203* 122* 80  BUN 14 24 16   CREATININE 0.79 0.97* 0.84  CALCIUM 9.5 9.7 9.5   Liver Function Tests: Recent Labs    04/09/19 0820 08/02/19 1514 01/24/20 1148  AST 13 10 15   ALT 12 7 9   BILITOT 0.4 0.3 0.3  PROT 6.2 6.7 6.7   No results for input(s): LIPASE, AMYLASE in the last 8760 hours. No results for input(s): AMMONIA in the last 8760 hours. CBC: Recent Labs    08/02/19 1514 09/02/19 0841 01/24/20 1148  WBC 15.5* 14.8* 16.3*  NEUTROABS 12,028* 10,449* 11,117*  HGB 14.0 13.9 13.9  HCT 43.1 42.6 42.2  MCV 83.0 83.0 84.6  PLT 350 373 368   Lipid Panel: Recent Labs    09/02/19 0841  CHOL 150  HDL 39*  LDLCALC 87  TRIG 139  CHOLHDL 3.8   Lab Results  Component Value Date   HGBA1C 8.6 (H) 04/09/2019    Assessment/Plan 1. Type 2 diabetes mellitus with diabetic polyneuropathy, with long-term current use of insulin (HCC) - cont current therapy,  check hba1c - Hemoglobin A1c - DME Wheelchair manual  2. Fibromyalgia - agree with trying amitriptyline for pain for her and come off oxycodone which she's been out of since last week anyway--we will no longer fill her opioids as she signed a contract with the PMR clinic - DME Wheelchair manual  3. Primary insomnia - try the amitriptyline which should help this - DME Wheelchair manual  4. Chronic bronchitis, unspecified chronic bronchitis type ([place]) - cont anoro, prn albuterol, may use mucinex for congestion - DME Wheelchair manual  5. Overactive bladder -cont myrbetriq, we discussed returning to urology but she never said if she wanted to or not at this point -  DME Wheelchair manual  6. Hyperlipidemia associated with type 2 diabetes mellitus ([place]) - needs FLP before next visit - DME Wheelchair manual  7. Pre-ulcerative calluses - left great toe, needs to f/u with podiatry--there is not clarity about whether she should be returning to Dr. Donnetta Hutching after she saw Dr. March Rummage last time (I couldn't see his note, only a picture of pt's foot) - DME Wheelchair manual  8. 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  She is unable to ambulate any distances since her back surgery and requires the wheelchair to do her daily activities including her bathing, dressing and grooming.  She can no longer do these things with her walker alone.  She also had back surgery, has osteoarthritis of her knees and obesity all of which limit her ability to ambulate to do her daily activities.  She will need this for the duration of her life.    Labs/tests ordered:   Lab Orders     Hemoglobin A1c     Hemoglobin A1c     Lipid panel     COMPLETE METABOLIC PANEL WITH GFR     CBC with Differential/Platelet     TSH     POC Glucose (CBG)  Next appt:  4 mos with fasting labs before  Aiana Nordquist L. Krisandra Bueno, D.O. [facility] [address], [place] [address] Cell Phone (Mon-Fri 8am-5pm):  [phone] On Call:  [phone] & follow prompts after 5pm & weekends Office Phone:  [phone] Office Fax:  [phone]

## 2020-02-10 NOTE — Telephone Encounter (Signed)
Oral swab drug screen was consistent for no controlled medications present.

## 2020-02-10 NOTE — Patient Instructions (Addendum)
Mucinex can help the congestion in your throat from your chronic bronchitis.  Please try the elavil pain management has prescribed.  You have already used up your oxycodone since last Thursday so you're already weaned.    Please follow-up with podiatry re: your left great toe pre-ulcerative callus.

## 2020-02-11 LAB — HEMOGLOBIN A1C
Hgb A1c MFr Bld: 7.7 % of total Hgb — ABNORMAL HIGH (ref <5.7)
Mean Plasma Glucose: 174 (calc)
eAG (mmol/L): 9.7 (calc)

## 2020-02-11 NOTE — Progress Notes (Signed)
Sugar average has improved to 7.7 from 8.6 which is great for her.

## 2020-02-13 ENCOUNTER — Telehealth: Payer: Self-pay | Admitting: *Deleted

## 2020-02-13 NOTE — Telephone Encounter (Signed)
Crystal with [facility] called and stated that they received order for patient's wheelchair but the Rx Comments have to be in the patient's progress note in order for insurance to cover the wheelchair. Needs Addendum made to OV note.   Comments  Patient suffers from obesity, chronic back pain which impairs their ability to perform daily activities like bathing, dressing, grooming in the home. A walker will not resolve issue with performing activities of daily living. A wheelchair will allow patient to safely perform daily activities. Patient can safely propel the wheelchair in the home or has a caregiver who can provide assistance. Length of need 99.   Accessories: elevating leg rests (ELRs), wheel locks, extensions and anti-tippers.

## 2020-02-15 ENCOUNTER — Other Ambulatory Visit: Payer: Self-pay | Admitting: Internal Medicine

## 2020-02-15 DIAGNOSIS — N3281 Overactive bladder: Secondary | ICD-10-CM

## 2020-02-17 ENCOUNTER — Other Ambulatory Visit: Payer: Self-pay | Admitting: Internal Medicine

## 2020-02-17 NOTE — Telephone Encounter (Signed)
rx sent to pharmacy by e-script  

## 2020-02-19 ENCOUNTER — Encounter (HOSPITAL_BASED_OUTPATIENT_CLINIC_OR_DEPARTMENT_OTHER): Payer: Medicare Other | Admitting: Physical Medicine and Rehabilitation

## 2020-02-19 ENCOUNTER — Other Ambulatory Visit: Payer: Self-pay

## 2020-02-19 ENCOUNTER — Encounter: Payer: Self-pay | Admitting: Physical Medicine and Rehabilitation

## 2020-02-19 VITALS — BP 164/76 | HR 94 | Temp 98.2°F | Ht 66.0 in | Wt 235.6 lb

## 2020-02-19 DIAGNOSIS — K5900 Constipation, unspecified: Secondary | ICD-10-CM

## 2020-02-19 DIAGNOSIS — G4701 Insomnia due to medical condition: Secondary | ICD-10-CM

## 2020-02-19 DIAGNOSIS — Z6838 Body mass index (BMI) 38.0-38.9, adult: Secondary | ICD-10-CM

## 2020-02-19 DIAGNOSIS — N3941 Urge incontinence: Secondary | ICD-10-CM | POA: Diagnosis not present

## 2020-02-19 DIAGNOSIS — G894 Chronic pain syndrome: Secondary | ICD-10-CM

## 2020-02-19 MED ORDER — HYDROCODONE-ACETAMINOPHEN 10-325 MG PO TABS: 1.0000 | ORAL_TABLET | Freq: Three times a day (TID) | ORAL | 0 refills | Status: DC | PRN

## 2020-02-19 MED ORDER — AMITRIPTYLINE HCL 25 MG PO TABS: 25.0000 mg | ORAL_TABLET | Freq: Every day | ORAL | 0 refills | Status: DC

## 2020-02-19 NOTE — Progress Notes (Signed)
Subjective:    Patient ID: Katherine Delgado, female    DOB: Jul 25, 1941, 79 y.o.   MRN: [id number]  HPI  Mrs. Maxham is a 79 year old woman with rheumatoid arthritis, fibromyalgia and diffuse body pains. It often radiates from one area to another. She takes a medication for RA which she does find to be helpful.   She also takes Tizanidine twice per day. It does not make her sleepy. She has been on it for several months.   Currently her pain is 10/10. She has been out of her Oxycodone for 2 weeks. She was unable to get from former provider since she signed pain contract with [place].   She has difficulty sleeping at night. She takes melatonin to help her sleep at night. She only sleeps 1 hr at a time at night. The Amitriptyline did not help. She did not experience negative side effects from it. She does not have atrial fibrillation. No dry mouth.   She is not able to walk much without a cane or walker or without holding onto something. She does not do as much washing dishes or cooking anymore.   She has a history of three back surgeries.   Her son helps a lot with housecleaning and grocery shopping as it is not easy to be mobile.   BP is 164/76 and HR 98- worse than last visit. She is on amlodipine 10mg  daily.   She takes Gabapentin 600mg  for her fibromyalgia. She takes Oxycodone 5mg  and she receives 90 per month. She has been taking three per day. These medications don't last very long. Her son feels that Norco has been more helpful to her in the past. She wants ot be able to move more and "feel like a human being."  She has had a lot of problems with her knees and back.   She has incontinence of bowel and bladder. This has been going on for three years or more. She wants to find out the reason for this. She take Myrbetriq daily but it does not help. She has seen a urologist and told her symptoms are secondary to constipation.  She asks if cranberries would be helpful to her.  Her son asks why  she should take Myrbetriq while taking Lasix.   Pain Inventory Average Pain 10 Pain Right Now 8 My pain is constant, sharp, burning, dull, stabbing, tingling and aching  In the last 24 hours, has pain interfered with the following? General activity 10 Relation with others 7 Enjoyment of life 10 What TIME of day is your pain at its worst? Anytime. Sleep (in general) Poor  Pain is worse with: walking, bending, sitting, inactivity, standing and some activites Pain improves with: rest and therapy/exercise Relief from Meds: Not taking pain prescription. Only Tylenol not helping.  Mobility walk without assistance use a cane use a walker how many minutes can you walk? 5 ability to climb steps?  no do you drive?  no use a wheelchair needs help with transfers Do you have any goals in this area?  yes  Function disabled: date disabled 60 I need assistance with the following:  meal prep, household duties, shopping and Son helps out at home. Do you have any goals in this area?  yes  Neuro/Psych bladder control problems bowel control problems weakness numbness tingling trouble walking spasms dizziness  Prior Studies Any changes since last visit?  no  Physicians involved in your care Any changes since last visit?  no   Family  History  Problem Relation Age of Onset   Alzheimer's disease Mother    Heart disease Mother    Heart disease Father    Liver disease Father    Cancer Brother    Arthritis Son    Colon polyps Brother    Colon cancer Neg Hx    Esophageal cancer Neg Hx    Kidney disease Neg Hx    Stomach cancer Neg Hx    Rectal cancer Neg Hx    Social History   Socioeconomic History   Marital status: Widowed    Spouse name: Not on file   Number of children: 2   Years of education: 14   Highest education level: Not on file  Occupational History   Occupation: Retired  Tobacco Use   Smoking status: Former Smoker    Packs/day: 0.10     Years: 10.00    Pack years: 1.00    Types: Cigarettes    Quit date: 12/07/1979    Years since quitting: 40.2   Smokeless tobacco: Never Used  Vaping Use   Vaping Use: Never used  Substance and Sexual Activity   Alcohol use: No    Alcohol/week: 0.0 standard drinks   Drug use: No   Sexual activity: Never  Other Topics Concern   Not on file  Social History Narrative   Walks with cane   Right handed    Caffeine use: Coffee (2 cups every morning)   Tea: sometimes   Soda: none   Social Determinants of Radio broadcast assistant Strain:    Difficulty of Paying Living Expenses:   Food Insecurity:    Worried About Charity fundraiser in the Last Year:    Arboriculturist in the Last Year:   Transportation Needs:    Film/video editor (Medical):    Lack of Transportation (Non-Medical):   Physical Activity:    Days of Exercise per Week:    Minutes of Exercise per Session:   Stress:    Feeling of Stress :   Social Connections:    Frequency of Communication with Friends and Family:    Frequency of Social Gatherings with Friends and Family:    Attends Religious Services:    Active Member of Clubs or Organizations:    Attends Archivist Meetings:    Marital Status:    Past Surgical History:  Procedure Laterality Date   ABDOMINAL HYSTERECTOMY  1974   abdominal tumor  2002   APPENDECTOMY     APPLICATION OF A-CELL OF BACK N/A 09/26/2018   Procedure: With Acell;  Surgeon: Wallace Going, DO;  Location: [facility];  Service: Plastics;  Laterality: N/A;   APPLICATION OF WOUND VAC N/A 09/26/2018   Procedure: Vac placement;  Surgeon: Wallace Going, DO;  Location: [place] [facility];  Service: Plastics;  Laterality: N/A;   BACK SURGERY  1982   BRONCHOSCOPY  2001   CATARACT EXTRACTION, BILATERAL  09/2019, 10/2019   CHOLECYSTECTOMY  1984   INCISION AND DRAINAGE OF WOUND N/A 09/26/2018   Procedure: Debridement of spine wound;  Surgeon: Wallace Going, DO;  Location: [facility];  Service: Plastics;  Laterality: N/A;   KNEE SURGERY Bilateral 08/27/2010 (L) and 01/11/2011 (R)   LUMBAR LAMINECTOMY/DECOMPRESSION MICRODISCECTOMY N/A 07/03/2018   Procedure: Lumbar Three to Lumbar Five Laminectomy;  Surgeon: Judith Part, MD;  Location: [place] [place];  Service: Neurosurgery;  Laterality: N/A;   LUMBAR WOUND DEBRIDEMENT N/A 08/20/2018   Procedure: POSTERIOR  LUMBAR SPINAL WOUND DEBRIDEMENT AND REVISION;  Surgeon: Judith Part, MD;  Location: [facility];  Service: Neurosurgery;  Laterality: N/A;  POSTERIOR LUMBAR SPINAL WOUND REVISION   miscarrage  1962   OVARIAN CYST SURGERY  1968   thymus tumor     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   TONSILLECTOMY     Past Medical History:  Diagnosis Date   Abnormality of gait 04/19/2016   Acute infective polyneuritis ([place]) 2002   Allergic rhinitis due to pollen    Chronic pain syndrome    COPD (chronic obstructive pulmonary disease) (HCC)    chronic bronchitis   Depressive disorder, not elsewhere classified    Diabetes mellitus without complication ([place])    Diaphragmatic hernia without mention of obstruction or gangrene    Dyslipidemia    Fibromyalgia    GERD (gastroesophageal reflux disease)    with h/o esophagitis   Guillain-Barre syndrome (HCC)    History of benign thymus tumor    Insomnia, unspecified    Lumbar spinal stenosis 03/30/2018   L4-5 level, severe   Miscarriage 1962   Mixed hyperlipidemia    Morbid obesity (HCC)    Osteoporosis    Pneumonia    Polyneuropathy in diabetes(357.2)    Rheumatoid arthritis(714.0)    Spondylosis, lumbosacral    Spontaneous ecchymoses    Type II or unspecified type diabetes mellitus with peripheral circulatory disorders, uncontrolled(250.72)    Unspecified chronic bronchitis (HCC)    Unspecified essential hypertension    Unspecified hypothyroidism    Unspecified pruritic disorder    Unspecified urinary incontinence    Pulse 94     Temp 98.2 F (36.8 C)    Ht 5\' 6"  (1.676 m)    Wt 235 lb 9.6 oz (106.9 kg)    SpO2 97%    BMI 38.03 kg/m   Opioid Risk Score:   Fall Risk Score:  `1  Depression screen PHQ 2/9  Depression screen [facility] 2/9 02/10/2020 02/05/2020 01/14/2020 09/11/2019 04/18/2019 01/11/2019 12/10/2018  Decreased Interest 0 3 0 0 0 0 0  Down, Depressed, Hopeless 0 0 0 0 0 0 0  PHQ - 2 Score 0 3 0 0 0 0 0  Altered sleeping - 3 - - - - -  Tired, decreased energy - 2 - - - - -  Change in appetite - 2 - - - - -  Feeling bad or failure about yourself  - 0 - - - - -  Trouble concentrating - 1 - - - - -  Moving slowly or fidgety/restless - 0 - - - - -  Suicidal thoughts - 0 - - - - -  PHQ-9 Score - 11 - - - - -  Difficult doing work/chores - Extremely dIfficult - - - - -  Some recent data might be hidden    Review of Systems  Constitutional: Positive for diaphoresis, fever and unexpected weight change.  Respiratory: Positive for apnea, shortness of breath and wheezing.   Gastrointestinal: Positive for abdominal pain, constipation and diarrhea.  Genitourinary: Positive for urgency.  Musculoskeletal: Positive for gait problem. Negative for myalgias.  Neurological: Positive for weakness.       Tingling  Hematological: Bruises/bleeds easily.  All other systems reviewed and are negative.      Objective:   Physical Exam  Gen: no distress, normal appearing HEENT: oral mucosa pink and moist, NCAT. Hard of hearing Cardio: Reg rate Chest: normal effort, normal rate of breathing Abd: soft, non-distended Ext: no edema Skin:  intact Skin: intact Neuro: Alert and oriented, good cognition Musculoskeletal: In wheelchair. Diminished strength throughout lower extremities.  Psych: pleasant, normal affect    Assessment & Plan:  Mrs. Pinkley is a 79 year old woman who presents with chronic pain syndrome and bilateral knee osteoarthritis, and severe insomnia.  1) Severe insomnia: Amitriptyline did not help. No side  effects. Increase dose to 25mg .   2) Chronic pain syndrome: Patient has severe bilateral OA that no longer benefits from injections. She is on Oxycodone but is developing tolerance to this. Discussed changing to Norco 10mg -325mg  three times per day.  Pain contract previously signed and UDS obtained. Discussed role of exercise and anti-inflammatory diet in pain relief. Recommended exercise bike given limited mobility and low carb diet.   3) Obesity class 2: current with is 235 lbs. Monitor each visit. Can contribute to chronic disease and worsening pain. Lifestyle changes as above. She has lost 1 lb since last visit.   4) Incontinence of bladder. Discussed timed voiding q2H. Cranberry supplements won't hurt, but help more for UTIs. Can continue Myrbetriq. Discussed that constipation could be a factor.   5) Congestive heart failure: educated that purpose of Lasix is to treat CHF and Myrbetric to treat urinary retention.  6) Constipation: currently with BM q3days. Does not feel constipated. At times has diarrhea. Discussed that this could be stool leaking around hard mass. Discussed that mag citrate could be helpful to clear her out, and OTC magnesium supplement can help with constipation and sleep. She is also on colace.   40 minutes spent discussing pain, constipation, insomnia, magnesium supplementation, urinary retention and incontinence. All questions answered. RTC in 1 month.

## 2020-02-20 ENCOUNTER — Other Ambulatory Visit: Payer: Self-pay | Admitting: Physical Medicine and Rehabilitation

## 2020-02-20 ENCOUNTER — Other Ambulatory Visit: Payer: Self-pay | Admitting: Internal Medicine

## 2020-02-20 MED ORDER — HYDROCODONE-ACETAMINOPHEN 10-325 MG PO TABS: 1.0000 | ORAL_TABLET | Freq: Three times a day (TID) | ORAL | 0 refills | Status: DC | PRN

## 2020-02-20 NOTE — Telephone Encounter (Signed)
I routed you the note with the addendum added for her wheelchair.  Please send to [facility].  Thanks.

## 2020-02-20 NOTE — Telephone Encounter (Signed)
Documents faxed

## 2020-02-21 ENCOUNTER — Telehealth: Payer: Self-pay

## 2020-02-21 NOTE — Telephone Encounter (Signed)
Crystal with [facility] called stating that the prescription that was received for the wheelchair needs to be on a separate sheet without the comments from the note on it. The comments from the note need to be on a separate sheet.

## 2020-02-21 NOTE — Telephone Encounter (Signed)
We originally sent the prescription, then we sent the note.  I'm not sure what they really want here.

## 2020-02-26 ENCOUNTER — Telehealth: Payer: Self-pay

## 2020-02-26 NOTE — Telephone Encounter (Signed)
Charlene sent them my note with an addendum just to justify the wheelchair previously after they requested that.  Please send it again.  Thanks.

## 2020-02-26 NOTE — Telephone Encounter (Signed)
Katherine Delgado with [facility] called stating that the office note was under the comment section on the order and it needed to be on a separate progress note from the provider. Since it was not received they will decline referral at this time meaning that they will no longer call to request the necessary information, but if they do receive the necessary information they will fill the order.  [facility] [phone] Fax [phone]

## 2020-02-27 NOTE — Telephone Encounter (Signed)
Information was refaxed 02/27/2020

## 2020-03-01 NOTE — Progress Notes (Signed)
  Subjective:  Patient ID: Katherine Delgado, female    DOB: [birth year],  MRN: [id number]  Chief Complaint  Patient presents with  . Wound Check    Left foot dorsal foot wound 1 week duration, no known injuries.    79 y.o. female presents for wound care. Hx confirmed with patient.  Objective:  Physical Exam: Wound Location: right dorsal foot Wound Measurement: 0.4x0.3 Wound Base: Mixed Granular/Fibrotic Peri-wound: Normal Exudate: None: wound tissue dry wound without warmth, erythema, signs of acute infection    Assessment:   1. Diabetic ulcer of toe of left foot associated with type 2 diabetes mellitus, limited to breakdown of skin ([place])   2. Acquired hallux valgus of both feet   3. Hammer toes of both feet   4. PAD (peripheral artery disease) ([place])      Plan:  Patient was evaluated and treated and all questions answered.  Ulcer right dorsal foot -No signs of acute infection noted -Dressed with abx ointment and DSD -Apply above daily -F/u in 1 month for recheck needed moving -Will make appt for DM shoes  No follow-ups on file.

## 2020-03-12 ENCOUNTER — Ambulatory Visit (INDEPENDENT_AMBULATORY_CARE_PROVIDER_SITE_OTHER): Payer: Medicare Other | Admitting: Internal Medicine

## 2020-03-12 ENCOUNTER — Other Ambulatory Visit: Payer: Self-pay

## 2020-03-12 ENCOUNTER — Encounter: Payer: Self-pay | Admitting: Internal Medicine

## 2020-03-12 VITALS — BP 118/74 | HR 73 | Temp 97.8°F | Ht 60.0 in | Wt 242.0 lb

## 2020-03-12 DIAGNOSIS — E1142 Type 2 diabetes mellitus with diabetic polyneuropathy: Secondary | ICD-10-CM

## 2020-03-12 DIAGNOSIS — R300 Dysuria: Secondary | ICD-10-CM | POA: Diagnosis not present

## 2020-03-12 DIAGNOSIS — R490 Dysphonia: Secondary | ICD-10-CM

## 2020-03-12 DIAGNOSIS — N390 Urinary tract infection, site not specified: Secondary | ICD-10-CM

## 2020-03-12 DIAGNOSIS — M48062 Spinal stenosis, lumbar region with neurogenic claudication: Secondary | ICD-10-CM | POA: Diagnosis not present

## 2020-03-12 DIAGNOSIS — N3281 Overactive bladder: Secondary | ICD-10-CM

## 2020-03-12 DIAGNOSIS — Z794 Long term (current) use of insulin: Secondary | ICD-10-CM

## 2020-03-12 DIAGNOSIS — B952 Enterococcus as the cause of diseases classified elsewhere: Secondary | ICD-10-CM | POA: Diagnosis not present

## 2020-03-12 DIAGNOSIS — B372 Candidiasis of skin and nail: Secondary | ICD-10-CM

## 2020-03-12 MED ORDER — NYSTATIN 100000 UNIT/GM EX CREA: 1.0000 "application " | TOPICAL_CREAM | Freq: Two times a day (BID) | CUTANEOUS | 3 refills | Status: DC

## 2020-03-12 MED ORDER — NYSTATIN 100000 UNIT/GM EX POWD: CUTANEOUS | 3 refills | Status: DC

## 2020-03-12 NOTE — Patient Instructions (Addendum)
take lasix and potassium with the weight gain until weight returns to baseline based on your scale.  Seems like 230 lbs at home should be your fluid weight baseline.  Try generic zyrtec (cetirizine) for your postnasal drip and hoarseness

## 2020-03-12 NOTE — Progress Notes (Signed)
Location:  [facility] clinic Provider: Eduardo Wurth L. Mariea Clonts, D.O., C.M.D.  Code Status: DNR Goals of Care:  Advanced Directives 03/12/2020  Does Patient Have a Medical Advance Directive? Yes  Type of Advance Directive Out of facility DNR (pink MOST or yellow form)  Does patient want to make changes to medical advance directive? No - Patient declined  Copy of [place] in Chart? -  Would patient like information on creating a medical advance directive? -  Pre-existing out of facility DNR order (yellow form or pink MOST form) -     Chief Complaint  Patient presents with  . Acute Visit    possible UTI and yeast under breast    HPI: Patient is a 79 y.o. female seen today for an acute visit for possible UTI and yeast under her breasts.  She has DMII with neuropathy, an ulcer of her toe under treatment.  Control fairly good for her. Lab Results  Component Value Date   HGBA1C 7.7 (H) 02/10/2020  She's having the dysuria, going small amounts every 2 hrs.  At first her urine was almost brown on her incontinence pad, but has cleared some.  She's got yeast infection under her breasts and in her groin area.  She tries to stay out of the heat and is mostly at home.    She is up b/w 6-7 lbs since last visit and has more edema than that time. Has taken her lasix a few times since last visit and edema has gone back down.  Reviewed taking lasix and potassium with the weight gain until weight returns to baseline based on her scale.  As far as her wheelchair, the company said they needed a new Rx and the certification.  Pt still needs a new wheelchair due to decreased mobility due to chronic back pain, peripheral neuropathy, heart failure.  Past Medical History:  Diagnosis Date  . Abnormality of gait 04/19/2016  . Acute infective polyneuritis ([place]) 2002  . Allergic rhinitis due to pollen   . Chronic pain syndrome   . COPD (chronic obstructive pulmonary disease) (HCC)    chronic  bronchitis  . Depressive disorder, not elsewhere classified   . Diabetes mellitus without complication ([place])   . Diaphragmatic hernia without mention of obstruction or gangrene   . Dyslipidemia   . Fibromyalgia   . GERD (gastroesophageal reflux disease)    with h/o esophagitis  . Guillain-Barre syndrome ([place])   . History of benign thymus tumor   . Insomnia, unspecified   . Lumbar spinal stenosis 03/30/2018   L4-5 level, severe  . Miscarriage 1962  . Mixed hyperlipidemia   . Morbid obesity ([place])   . Osteoporosis   . Pneumonia   . Polyneuropathy in diabetes(357.2)   . Rheumatoid arthritis(714.0)   . Spondylosis, lumbosacral   . Spontaneous ecchymoses   . Type II or unspecified type diabetes mellitus with peripheral circulatory disorders, uncontrolled(250.72)   . Unspecified chronic bronchitis ([place])   . Unspecified essential hypertension   . Unspecified hypothyroidism   . Unspecified pruritic disorder   . Unspecified urinary incontinence     Past Surgical History:  Procedure Laterality Date  . ABDOMINAL HYSTERECTOMY  1974  . abdominal tumor  2002  . APPENDECTOMY    . APPLICATION OF A-CELL OF BACK N/A 09/26/2018   Procedure: With [place];  Surgeon: Wallace Going, DO;  Location: [place];  Service: Plastics;  Laterality: N/A;  . APPLICATION OF WOUND VAC N/A 09/26/2018  Procedure: Vac placement;  Surgeon: Wallace Going, DO;  Location: [place];  Service: Plastics;  Laterality: N/A;  . [place]  . BRONCHOSCOPY  2001  . CATARACT EXTRACTION, BILATERAL  09/2019, 10/2019  . CHOLECYSTECTOMY  1984  . INCISION AND DRAINAGE OF WOUND N/A 09/26/2018   Procedure: Debridement of spine wound;  Surgeon: Wallace Going, DO;  Location: [place];  Service: Plastics;  Laterality: N/A;  . KNEE SURGERY Bilateral 08/27/2010 (L) and 01/11/2011 (R)  . LUMBAR LAMINECTOMY/DECOMPRESSION MICRODISCECTOMY N/A 07/03/2018   Procedure: Lumbar Three to Lumbar Five Laminectomy;  Surgeon: Judith Part, MD;  Location: [place];  Service: Neurosurgery;  Laterality: N/A;  . LUMBAR WOUND DEBRIDEMENT N/A 08/20/2018   Procedure: POSTERIOR LUMBAR SPINAL WOUND DEBRIDEMENT AND REVISION;  Surgeon: Judith Part, MD;  Location: [place];  Service: Neurosurgery;  Laterality: N/A;  POSTERIOR LUMBAR SPINAL WOUND REVISION  . [place]  . OVARIAN CYST SURGERY  1968  . thymus tumor    . 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 TONSILLECTOMY      Allergies  Allergen Reactions  . Influenza Vaccines Other (See Comments)    "h/o Guillain Barre; dr's told me years ago never to take another flu shot as it could relapse Rosalee Kaufman; has to do with when vaccine being changed to H1N1 virus"  . Penicillins Hives, Itching, Swelling and Rash    Has patient had a PCN reaction causing immediate rash, facial/tongue/throat swelling, SOB or lightheadedness with hypotension:Unknown Has patient had a PCN reaction causing severe rash involving mucus membranes or skin necrosis: Unknown Has patient had a PCN reaction that required hospitalization:Unknown Has patient had a PCN reaction occurring within the last 10 years: Unknown If all of the above answers are "NO", then may proceed with Cephalosporin use.   . Statins Other (See Comments)    Myopathy, transaminitis  . Sulfamethoxazole-Trimethoprim Itching    Outpatient Encounter Medications as of 03/12/2020  Medication Sig  . acetaminophen (TYLENOL) 325 MG tablet Take 650 mg by mouth every 6 (six) hours as needed (for pain.).  Marland Kitchen amitriptyline (ELAVIL) 25 MG tablet Take 1 tablet (25 mg total) by mouth at bedtime.  Marland Kitchen amLODipine (NORVASC) 10 MG tablet Take 1 tablet (10 mg total) by mouth daily.  Marland Kitchen aspirin EC 81 MG tablet Take 81 mg by mouth 2 (two) times a week.  . Chlorphen-Phenyleph-ASA (ALKA-SELTZER PLUS COLD PO) Take by mouth as needed.  . cholecalciferol (VITAMIN D3) 25 MCG (1000 UT) tablet Take 1,000 Units by mouth daily.  Marland Kitchen docusate sodium (COLACE) 100 MG capsule  Take 100 mg by mouth daily as needed (constipation.).   Marland Kitchen folic acid (FOLVITE) 1 MG tablet Take 2 tablets (2 mg total) by mouth daily.  . furosemide (LASIX) 20 MG tablet TAKE 1 TABLET BY MOUTH TWICE DAILY AS NEEDED FOR 3 POUND WEIGHT GAIN IN ONE DAY OR 5 POUNDS IN ONE WEEK  . gabapentin (NEURONTIN) 600 MG tablet TAKE 1 TABLET(600 MG) BY MOUTH THREE TIMES DAILY  . HYDROcodone-acetaminophen (NORCO) 10-325 MG tablet Take 1 tablet by mouth 3 (three) times daily as needed.  . Insulin Lispro (HUMALOG KWIKPEN) 200 UNIT/ML SOPN Inject 20 Units into the skin 3 (three) times daily.  . Insulin Pen Needle (B-D UF III MINI PEN NEEDLES) 31G X 5 MM MISC USE AS DIRECTED  . levothyroxine (SYNTHROID) 137 MCG tablet TAKE 1 TABLET BY MOUTH EVERY DAY 30 MINUTES BEFORE BREAKFAST  . lisinopril (ZESTRIL) 20  MG tablet TAKE 1 TABLET(20 MG) BY MOUTH DAILY  . loperamide (IMODIUM A-D) 2 MG tablet Take 2 mg by mouth 4 (four) times daily as needed for diarrhea or loose stools.  . magnesium oxide (MAG-OX) 400 MG tablet Take 1 tablet (400 mg total) by mouth daily as needed (for leg cramps).  . Melatonin 10 MG TABS Take 10 mg by mouth at bedtime.  . metoprolol tartrate (LOPRESSOR) 50 MG tablet TAKE 1 TABLET(50 MG) BY MOUTH TWICE DAILY  . Multiple Vitamins-Minerals (MULTIVITAMIN ADULT PO) Take 1 tablet by mouth daily.  Marland Kitchen MYRBETRIQ 50 MG TB24 tablet TAKE 1 TABLET(50 MG) BY MOUTH DAILY  . nystatin (NYSTATIN) powder APPLY TOPICALLY THREE TIMES DAILY AS DIRECTED FOR 14 DAYS  . nystatin cream (MYCOSTATIN) Apply 1 application topically 2 (two) times daily. To skin folds  . omega-3 acid ethyl esters (LOVAZA) 1 g capsule TAKE ONE CAPSULE BY MOUTH EVERY DAY  . polyvinyl alcohol (LIQUIFILM TEARS) 1.4 % ophthalmic solution Place 1 drop into both eyes 4 (four) times daily as needed (for dry/irritated eyes).   . potassium chloride (MICRO-K) 10 MEQ CR capsule Take 2 capsules (20 mEq total) by mouth 2 (two) times daily as needed (when taking a  dose of lasix.).  Marland Kitchen Tofacitinib Citrate (XELJANZ) 5 MG TABS Take 1 tablet (5 mg total) by mouth daily.  Nelva Nay SOLOSTAR 300 UNIT/ML Solostar Pen INJECT 100 UNITS INTO THE SKIN EVERY NIGHT AT BEDTIME  . triamterene-hydrochlorothiazide (MAXZIDE-25) 37.5-25 MG tablet TAKE 1 TABLET BY MOUTH DAILY  . umeclidinium-vilanterol (ANORO ELLIPTA) 62.5-25 MCG/INH AEPB Inhale 1 puff into the lungs daily.  Marland Kitchen venlafaxine XR (EFFEXOR-XR) 75 MG 24 hr capsule TAKE 1 CAPSULE BY MOUTH EVERY DAY WITH BREAKFAST  . VICTOZA 18 MG/3ML SOPN ADMINISTER 1.8 MG UNDER THE SKIN DAILY FOR BLOOD SUGAR   No facility-administered encounter medications on file as of 03/12/2020.    Review of Systems:  Review of Systems  Constitutional: Negative for chills, fever and malaise/fatigue.  HENT: Negative for congestion.   Eyes: Negative for blurred vision.  Respiratory: Positive for wheezing. Negative for cough and shortness of breath.   Cardiovascular: Positive for leg swelling. Negative for chest pain and palpitations.  Gastrointestinal: Negative for abdominal pain.  Genitourinary: Positive for dysuria, frequency and urgency.  Musculoskeletal: Positive for joint pain and myalgias. Negative for falls.  Skin: Positive for itching and rash.  Neurological: Negative for dizziness and loss of consciousness.  Psychiatric/Behavioral: Negative for depression and memory loss. The patient is not nervous/anxious and does not have insomnia.     Health Maintenance  Topic Date Due  . FOOT EXAM  04/17/2020  . HEMOGLOBIN A1C  08/11/2020  . OPHTHALMOLOGY EXAM  10/02/2020  . DEXA SCAN  09/20/2025  . TETANUS/TDAP  05/28/2026  . COVID-19 Vaccine  Completed  . Hepatitis C Screening  Completed  . PNA vac Low Risk Adult  Completed  . INFLUENZA VACCINE  Discontinued    Physical Exam: Vitals:   03/12/20 0830  BP: 118/74  Pulse: 73  Temp: 97.8 F (36.6 C)  SpO2: 95%  Weight: 242 lb (109.8 kg)  Height: 5' (1.524 m)   Body mass index  is 47.26 kg/m. Physical Exam Vitals reviewed.  Constitutional:      General: She is not in acute distress.    Appearance: She is obese. She is not toxic-appearing.  HENT:     Head: Normocephalic and atraumatic.  Cardiovascular:     Rate and Rhythm: Normal rate  and regular rhythm.     Heart sounds: No murmur heard.   Pulmonary:     Effort: Pulmonary effort is normal.     Breath sounds: Normal breath sounds. No rales.  Abdominal:     General: Bowel sounds are normal.  Skin:    General: Skin is warm.     Capillary Refill: Capillary refill takes less than 2 seconds.     Comments: Erythematous areas beneath breasts and pannus which are moist in places; evidence of excoriation  Neurological:     General: No focal deficit present.     Mental Status: She is alert and oriented to person, place, and time.     Sensory: Sensory deficit present.     Motor: Weakness present.     Gait: Gait abnormal.     Comments: Comes into office in manual wheelchair and propels herself  Psychiatric:        Mood and Affect: Mood normal.     Labs reviewed: Basic Metabolic Panel: Recent Labs    08/02/19 1514 09/02/19 0841 01/24/20 1148  NA 139 141 143  K 4.0 4.6 4.1  CL 99 103 104  CO2 27 31 29   GLUCOSE 203* 122* 80  BUN 14 24 16   CREATININE 0.79 0.97* 0.84  CALCIUM 9.5 9.7 9.5   Liver Function Tests: Recent Labs    04/09/19 0820 08/02/19 1514 01/24/20 1148  AST 13 10 15   ALT 12 7 9   BILITOT 0.4 0.3 0.3  PROT 6.2 6.7 6.7   No results for input(s): LIPASE, AMYLASE in the last 8760 hours. No results for input(s): AMMONIA in the last 8760 hours. CBC: Recent Labs    08/02/19 1514 09/02/19 0841 01/24/20 1148  WBC 15.5* 14.8* 16.3*  NEUTROABS 12,028* 10,449* 11,117*  HGB 14.0 13.9 13.9  HCT 43.1 42.6 42.2  MCV 83.0 83.0 84.6  PLT 350 373 368   Lipid Panel: Recent Labs    09/02/19 0841  CHOL 150  HDL 39*  LDLCALC 87  TRIG 139  CHOLHDL 3.8   Lab Results  Component Value  Date   HGBA1C 7.7 (H) 02/10/2020    Procedures since last visit: No results found.  Assessment/Plan 1. Dysuria -Obtain UA C+S - Culture, Urine; Future -If positive will treat with antibiotics, but we will not treat head of time due to recurrent use of antibiotics and increasing resistance  2. Candidal skin infection -Recurs due to diabetes and obesity -glucose control has actually been better lately - nystatin (NYSTATIN) powder; APPLY TOPICALLY THREE TIMES DAILY AS DIRECTED FOR 14 DAYS and prn yeast infection  Dispense: 60 g; Refill: 3 - nystatin cream (MYCOSTATIN); Apply 1 application topically 2 (two) times daily. To skin folds  Dispense: 30 g; Refill: 3  3. Spinal stenosis of lumbar region with neurogenic claudication -Status post surgery previously that had gotten complicated by wound dehiscence and some evidence of infection. She has been using a wheelchair more since before that surgery  4. Type 2 diabetes mellitus with diabetic polyneuropathy, with long-term current use of insulin (HCC) Lab Results  Component Value Date   HGBA1C 7.7 (H) 02/10/2020  -This is great control for her -continue same diabetes regimen  5. Overactive bladder -ongoing, does report dysuria now suggesting true infection, incontinence and diabetes not helping with recurrent utis  6. Hoarseness -likely postnasal drip and possibly a bit of gerd  Labs/tests ordered:   Lab Orders     Culture, Urine  Next appt:  06/11/2020  Valta Dillon L. Ruble Buttler, D.O. [facility] [address], [place] [address] Cell Phone (Mon-Fri 8am-5pm):  [phone] On Call:  [phone] & follow prompts after 5pm & weekends Office Phone:  [phone] Office Fax:  [phone]

## 2020-03-14 LAB — URINE CULTURE
MICRO NUMBER:: 10739744
SPECIMEN QUALITY:: ADEQUATE

## 2020-03-15 ENCOUNTER — Other Ambulatory Visit: Payer: Self-pay | Admitting: Internal Medicine

## 2020-03-16 MED ORDER — CEFACLOR 500 MG PO CAPS: 500.0000 mg | ORAL_CAPSULE | Freq: Three times a day (TID) | ORAL | 0 refills | Status: DC

## 2020-03-16 NOTE — Telephone Encounter (Signed)
Pharmacy requested refill °Pended Rx and sent to Dr. Reed for approval due to HIGH ALERT Warning.  °

## 2020-03-16 NOTE — Progress Notes (Signed)
Urine culture did grow out over 100,000 Enterococcus so I have just sent cefaclor antibiotic to her pharmacy.  She should eat yogurt while on the antibiotics.  Please let her know that she is developing increasing resistance to antibiotics due to frequent UTI treatment.  She does have overactive bladder and I want to be sure we are not overtesting her.

## 2020-03-25 ENCOUNTER — Encounter
Payer: Medicare Other | Attending: Physical Medicine and Rehabilitation | Admitting: Physical Medicine and Rehabilitation

## 2020-03-25 ENCOUNTER — Other Ambulatory Visit: Payer: Self-pay

## 2020-03-25 ENCOUNTER — Encounter: Payer: Self-pay | Admitting: Physical Medicine and Rehabilitation

## 2020-03-25 VITALS — BP 133/78 | HR 65 | Temp 98.3°F | Ht 66.0 in | Wt 242.6 lb

## 2020-03-25 DIAGNOSIS — M961 Postlaminectomy syndrome, not elsewhere classified: Secondary | ICD-10-CM | POA: Diagnosis not present

## 2020-03-25 DIAGNOSIS — M1711 Unilateral primary osteoarthritis, right knee: Secondary | ICD-10-CM

## 2020-03-25 DIAGNOSIS — Z79891 Long term (current) use of opiate analgesic: Secondary | ICD-10-CM | POA: Insufficient documentation

## 2020-03-25 DIAGNOSIS — M48062 Spinal stenosis, lumbar region with neurogenic claudication: Secondary | ICD-10-CM

## 2020-03-25 DIAGNOSIS — Z5181 Encounter for therapeutic drug level monitoring: Secondary | ICD-10-CM | POA: Diagnosis not present

## 2020-03-25 DIAGNOSIS — K5900 Constipation, unspecified: Secondary | ICD-10-CM | POA: Diagnosis not present

## 2020-03-25 DIAGNOSIS — G4701 Insomnia due to medical condition: Secondary | ICD-10-CM | POA: Insufficient documentation

## 2020-03-25 DIAGNOSIS — G894 Chronic pain syndrome: Secondary | ICD-10-CM | POA: Diagnosis not present

## 2020-03-25 DIAGNOSIS — Z6838 Body mass index (BMI) 38.0-38.9, adult: Secondary | ICD-10-CM | POA: Diagnosis not present

## 2020-03-25 DIAGNOSIS — M170 Bilateral primary osteoarthritis of knee: Secondary | ICD-10-CM | POA: Diagnosis not present

## 2020-03-25 DIAGNOSIS — N3941 Urge incontinence: Secondary | ICD-10-CM | POA: Insufficient documentation

## 2020-03-25 MED ORDER — HYDROCODONE-ACETAMINOPHEN 10-325 MG PO TABS: 1.0000 | ORAL_TABLET | Freq: Four times a day (QID) | ORAL | 0 refills | Status: DC | PRN

## 2020-03-25 NOTE — Progress Notes (Signed)
Subjective:    Patient ID: Katherine Delgado, female    DOB: [birth year], 79 y.o.   MRN: [id number]  HPI  Katherine Delgado is a 79 year old woman with rheumatoid arthritis, fibromyalgia and diffuse body pains. It often radiates from one area to another. She takes a medication for RA which she does find to be helpful.   She also takes Tizanidine twice per day. It does not make her sleepy. She has been on it for several months.   She has difficulty sleeping at night. She takes melatonin to help her sleep at night. She only sleeps 1 hr at a time at night. The Amitriptyline did not help. She did not experience negative side effects from it. She does not have atrial fibrillation. No dry mouth.   She is not able to walk much without a cane or walker or without holding onto something. She does not do as much washing dishes or cooking anymore. Her son wheels her in and out of today's appointment.   She has a history of three back surgeries.   Her son helps a lot with housecleaning and grocery shopping as it is not easy to be mobile.   BP is 133/78- much better controlled than last visit. She is on amlodipine 10mg  daily.   She takes Gabapentin 600mg  for her fibromyalgia. She was previously prescribed oxycodone 5mg  three times per day for her pain. These medications don't last very long. Her son feels that Norco has been more helpful to her in the past. She wants ot be able to move more and "feel like a human being." Last visit I prescribed Norco and this did help more, but still quite not enough. She has not developed worsening constipation from it- in fact her BM frequency has improved from q3 days to q2 days.   She has had a lot of problems with her knees and back. She has had prior diagnoses of lumbar spinal stenosis and bilateral knee osteoarthritis s/p corticosteroid injections which no longer provide benefit.   She has incontinence of bowel and bladder. This has been going on for three years or more.  She wants to find out the reason for this. She take Myrbetriq daily but it does not help. She has seen a urologist and told her symptoms are secondary to constipation.  She asks if cranberries would be helpful to her.  Her son previously asked why she should take Myrbetriq while taking Lasix and I discussed rationale for this. He understood.  Average pain is currently 7/10, improved from 10/10 last visit. The pain right now is 8/10. The pain feels constant, sharp, stabbing, tingling, and aching and continues to greatly impact her quality of life.   Pain Inventory Average Pain 7 Pain Right Now 8 My pain is constant, sharp, stabbing, tingling and aching  In the last 24 hours, has pain interfered with the following? General activity 10 Relation with others 10 Enjoyment of life 10 What TIME of day is your pain at its worst? Evening & Night Sleep (in general) Poor  Pain is worse with: walking, bending, sitting, standing and some activites Pain improves with: rest, pacing activities and medication Relief from Meds: 7  Mobility walk without assistance use a cane use a walker how many minutes can you walk? 5 ability to climb steps?  no do you drive?  no use a wheelchair needs help with transfers Do you have any goals in this area?  yes  Function disabled: date disabled 87  I need assistance with the following:  meal prep, household duties, shopping and Son helps out at home. Do you have any goals in this area?  yes  Neuro/Psych bladder control problems bowel control problems weakness numbness tingling trouble walking spasms dizziness  Prior Studies Any changes since last visit?  no  Physicians involved in your care Any changes since last visit?  no   Family History  Problem Relation Age of Onset   Alzheimer's disease Mother    Heart disease Mother    Heart disease Father    Liver disease Father    Cancer Brother    Arthritis Son    Colon polyps Brother      Colon cancer Neg Hx    Esophageal cancer Neg Hx    Kidney disease Neg Hx    Stomach cancer Neg Hx    Rectal cancer Neg Hx    Social History   Socioeconomic History   Marital status: Widowed    Spouse name: Not on file   Number of children: 2   Years of education: 14   Highest education level: Not on file  Occupational History   Occupation: Retired  Tobacco Use   Smoking status: Former Smoker    Packs/day: 0.10    Years: 10.00    Pack years: 1.00    Types: Cigarettes    Quit date: 12/07/1979    Years since quitting: 40.3   Smokeless tobacco: Never Used  Vaping Use   Vaping Use: Never used  Substance and Sexual Activity   Alcohol use: No    Alcohol/week: 0.0 standard drinks   Drug use: No   Sexual activity: Never  Other Topics Concern   Not on file  Social History Narrative   Walks with cane   Right handed    Caffeine use: Coffee (2 cups every morning)   Tea: sometimes   Soda: none   Social Determinants of Radio broadcast assistant Strain:    Difficulty of Paying Living Expenses:   Food Insecurity:    Worried About Charity fundraiser in the Last Year:    Arboriculturist in the Last Year:   Transportation Needs:    Film/video editor (Medical):    Lack of Transportation (Non-Medical):   Physical Activity:    Days of Exercise per Week:    Minutes of Exercise per Session:   Stress:    Feeling of Stress :   Social Connections:    Frequency of Communication with Friends and Family:    Frequency of Social Gatherings with Friends and Family:    Attends Religious Services:    Active Member of Clubs or Organizations:    Attends Archivist Meetings:    Marital Status:    Past Surgical History:  Procedure Laterality Date   ABDOMINAL HYSTERECTOMY  1974   abdominal tumor  2002   APPENDECTOMY     APPLICATION OF A-CELL OF BACK N/A 09/26/2018   Procedure: With Acell;  Surgeon: Wallace Going, DO;  Location:  [place];  Service: Plastics;  Laterality: N/A;   APPLICATION OF WOUND VAC N/A 09/26/2018   Procedure: Vac placement;  Surgeon: Wallace Going, DO;  Location: [facility];  Service: Plastics;  Laterality: N/A;   BACK SURGERY  1982   BRONCHOSCOPY  2001   CATARACT EXTRACTION, BILATERAL  09/2019, 10/2019   CHOLECYSTECTOMY  1984   INCISION AND DRAINAGE OF WOUND N/A 09/26/2018   Procedure: Debridement of spine  wound;  Surgeon: Wallace Going, DO;  Location: [facility];  Service: Plastics;  Laterality: N/A;   KNEE SURGERY Bilateral 08/27/2010 (L) and 01/11/2011 (R)   LUMBAR LAMINECTOMY/DECOMPRESSION MICRODISCECTOMY N/A 07/03/2018   Procedure: Lumbar Three to Lumbar Five Laminectomy;  Surgeon: Judith Part, MD;  Location: [facility];  Service: Neurosurgery;  Laterality: N/A;   LUMBAR WOUND DEBRIDEMENT N/A 08/20/2018   Procedure: POSTERIOR LUMBAR SPINAL WOUND DEBRIDEMENT AND REVISION;  Surgeon: Judith Part, MD;  Location: [facility];  Service: Neurosurgery;  Laterality: N/A;  POSTERIOR LUMBAR SPINAL WOUND REVISION   miscarrage  1962   OVARIAN CYST SURGERY  1968   thymus tumor     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   TONSILLECTOMY     Past Medical History:  Diagnosis Date   Abnormality of gait 04/19/2016   Acute infective polyneuritis ([place]) 2002   Allergic rhinitis due to pollen    Chronic pain syndrome    COPD (chronic obstructive pulmonary disease) (HCC)    chronic bronchitis   Depressive disorder, not elsewhere classified    Diabetes mellitus without complication ([place])    Diaphragmatic hernia without mention of obstruction or gangrene    Dyslipidemia    Fibromyalgia    GERD (gastroesophageal reflux disease)    with h/o esophagitis   Guillain-Barre syndrome (HCC)    History of benign thymus tumor    Insomnia, unspecified    Lumbar spinal stenosis 03/30/2018   L4-5 level, severe   Miscarriage 1962   Mixed hyperlipidemia    Morbid obesity ([place])    Osteoporosis     Pneumonia    Polyneuropathy in diabetes(357.2)    Rheumatoid arthritis(714.0)    Spondylosis, lumbosacral    Spontaneous ecchymoses    Type II or unspecified type diabetes mellitus with peripheral circulatory disorders, uncontrolled(250.72)    Unspecified chronic bronchitis (HCC)    Unspecified essential hypertension    Unspecified hypothyroidism    Unspecified pruritic disorder    Unspecified urinary incontinence    There were no vitals taken for this visit.  Opioid Risk Score:   Fall Risk Score:  `1  Depression screen PHQ 2/9  Depression screen [facility] 2/9 03/12/2020 02/19/2020 02/10/2020 02/05/2020 01/14/2020 09/11/2019 04/18/2019  Decreased Interest 0 0 0 3 0 0 0  Down, Depressed, Hopeless 0 0 0 0 0 0 0  PHQ - 2 Score 0 0 0 3 0 0 0  Altered sleeping - 0 - 3 - - -  Tired, decreased energy - 0 - 2 - - -  Change in appetite - 0 - 2 - - -  Feeling bad or failure about yourself  - 0 - 0 - - -  Trouble concentrating - 0 - 1 - - -  Moving slowly or fidgety/restless - 0 - 0 - - -  Suicidal thoughts - 0 - 0 - - -  PHQ-9 Score - 0 - 11 - - -  Difficult doing work/chores - - - Extremely dIfficult - - -  Some recent data might be hidden    Review of Systems  Constitutional: Positive for diaphoresis, fever and unexpected weight change.  Respiratory: Positive for apnea, shortness of breath and wheezing.   Gastrointestinal: Positive for abdominal pain, constipation and diarrhea.  Genitourinary: Positive for urgency.  Musculoskeletal: Positive for gait problem. Negative for myalgias.  Neurological: Positive for weakness.       Tingling  Hematological: Bruises/bleeds easily.  All other systems reviewed and are negative.  Objective:   Physical Exam Gen: no distress, normal appearing, hard of hearing HEENT: oral mucosa pink and moist, NCAT Cardio: Reg rate Chest: normal effort, normal rate of breathing Abd: soft, non-distended Ext: no edema Skin: intact Neuro: Alert and  oriented, good cognition Musculoskeletal: In wheelchair- requires son to push her. Diminished strength throughout lower extremities.  Psych: pleasant, normal affect    Assessment & Plan:  Katherine Delgado is a 79 year old woman who presents with chronic pain syndrome, failed back syndrome, lumbar stenosis, and bilateral knee osteoarthritis, and severe insomnia.  1) Severe insomnia: Amitriptyline did not help. No side effects. Increased dose to 25mg  last visit. Can maintain this dose.   2) Chronic pain syndrome secondary to lumbar spinal stenosis, failed back syndrome, and bilateral knee osteoarthritis that no longer benefits from injections. She is on Oxycodone but is developing tolerance to this. Increase dose of Norco to 10mg -325mg  four times per day as needed for pain.  Pain contract previously signed and UDS obtained. Discussed role of exercise and anti-inflammatory diet in pain relief. Recommended exercise bike given limited mobility and low carb diet.   3) Obesity class 2: current weight is 243 lbs, up from 235 last visit. Monitor each visit. Can contribute to chronic disease and worsening pain. Lifestyle changes as above.  4) Incontinence of bladder. Discussed timed voiding q2H. Cranberry supplements won't hurt, but help more for UTIs. Can continue Myrbetriq. Discussed that constipation could be a factor.   5) Congestive heart failure: educated that purpose of Lasix is to treat CHF and Myrbetric to treat urinary retention. Advised to follow-up with Dr. Mariea Clonts given her increased weight since last visit, which she attributes to fluid retention. She may benefit from a higher dose of Lasix.   6) Constipation: currently with BM q2day- improved form last vist. Does not feel constipated. At times has diarrhea. Discussed that this could be stool leaking around hard mass. Discussed that mag citrate could be helpful to clear her out, and OTC magnesium supplement can help with constipation and sleep. She is  also on colace.   All questions answered. RTC in 1 month.

## 2020-03-25 NOTE — Addendum Note (Signed)
Addended by: Izora Ribas on: 03/25/2020 04:46 PM   Modules accepted: Orders

## 2020-04-01 ENCOUNTER — Telehealth: Payer: Self-pay

## 2020-04-01 DIAGNOSIS — R300 Dysuria: Secondary | ICD-10-CM

## 2020-04-01 NOTE — Telephone Encounter (Signed)
Let's have her get a new urine sample.  ([facility] has [facility]).

## 2020-04-01 NOTE — Telephone Encounter (Signed)
Patient called stating that she has finished the course of cefaclor 500 mg capsule for UTI, but UTI has not gone. She is still having burning and of course incontinence. Patient wants to know if you want to refill the previous antibiotic or call in something different. Please advise.

## 2020-04-02 ENCOUNTER — Other Ambulatory Visit: Payer: Self-pay

## 2020-04-02 ENCOUNTER — Other Ambulatory Visit: Payer: Medicare Other

## 2020-04-02 NOTE — Telephone Encounter (Signed)
Patient called. Message given to patient and she will come to lab (lab tech in office) today and leave Urine.  Orders placed.

## 2020-04-12 ENCOUNTER — Other Ambulatory Visit: Payer: Self-pay | Admitting: Internal Medicine

## 2020-04-18 ENCOUNTER — Other Ambulatory Visit: Payer: Self-pay | Admitting: Rheumatology

## 2020-04-20 NOTE — Telephone Encounter (Signed)
Left message to advise patient she is due to update labs..  

## 2020-04-20 NOTE — Telephone Encounter (Signed)
She is due to update TB gold.  Ok to refill 30 day supply of Morrie Sheldon until she has updated lab work.  CBC and CMP are also due this week.

## 2020-04-20 NOTE — Telephone Encounter (Signed)
Last Visit: 01/24/2020  Next Visit: 06/25/2020 Labs: 01/24/2020 WBC count is chronically elevated. Absolute neutrophils elevated but stable. CMP WNL.   Current Dose per office note on 01/24/2020: Morrie Sheldon 5 mg 1 tablet by mouth daily Dx: Rheumatoid arthritis involving multiple sites with positive rheumatoid factor   Okay to refill Morrie Sheldon?

## 2020-04-21 NOTE — Telephone Encounter (Signed)
Returned patient's call. Patient advised she is due to have her labs including her TB Gold.

## 2020-04-22 ENCOUNTER — Other Ambulatory Visit: Payer: Self-pay | Admitting: *Deleted

## 2020-04-22 MED ORDER — METOPROLOL TARTRATE 50 MG PO TABS: ORAL_TABLET | ORAL | 1 refills | Status: DC

## 2020-04-22 NOTE — Telephone Encounter (Signed)
Received refill request from [facility].

## 2020-04-23 ENCOUNTER — Encounter
Payer: Medicare Other | Attending: Physical Medicine and Rehabilitation | Admitting: Physical Medicine and Rehabilitation

## 2020-04-23 ENCOUNTER — Encounter: Payer: Self-pay | Admitting: Physical Medicine and Rehabilitation

## 2020-04-23 ENCOUNTER — Other Ambulatory Visit: Payer: Self-pay

## 2020-04-23 VITALS — BP 124/58 | HR 66 | Ht 66.0 in | Wt 239.0 lb

## 2020-04-23 DIAGNOSIS — N3941 Urge incontinence: Secondary | ICD-10-CM | POA: Insufficient documentation

## 2020-04-23 DIAGNOSIS — G894 Chronic pain syndrome: Secondary | ICD-10-CM | POA: Diagnosis not present

## 2020-04-23 DIAGNOSIS — G4701 Insomnia due to medical condition: Secondary | ICD-10-CM | POA: Diagnosis not present

## 2020-04-23 DIAGNOSIS — Z6838 Body mass index (BMI) 38.0-38.9, adult: Secondary | ICD-10-CM | POA: Insufficient documentation

## 2020-04-23 DIAGNOSIS — Z5181 Encounter for therapeutic drug level monitoring: Secondary | ICD-10-CM | POA: Insufficient documentation

## 2020-04-23 DIAGNOSIS — K5900 Constipation, unspecified: Secondary | ICD-10-CM

## 2020-04-23 DIAGNOSIS — Z79891 Long term (current) use of opiate analgesic: Secondary | ICD-10-CM | POA: Insufficient documentation

## 2020-04-23 MED ORDER — AMITRIPTYLINE HCL 25 MG PO TABS: 25.0000 mg | ORAL_TABLET | Freq: Every day | ORAL | 0 refills | Status: DC

## 2020-04-23 MED ORDER — HYDROCODONE-ACETAMINOPHEN 10-325 MG PO TABS: 1.0000 | ORAL_TABLET | Freq: Four times a day (QID) | ORAL | 0 refills | Status: DC | PRN

## 2020-04-23 NOTE — Progress Notes (Signed)
Subjective:    Patient ID: Katherine Delgado, female    DOB: Feb 10, 1941, 79 y.o.   MRN: [id number]  HPI  Due to national recommendations of social distancing because of COVID 44, an audio/video tele-health visit is felt to be the most appropriate encounter for this patient at this time. See MyChart message from today for the patient's consent to a tele-health encounter with [facility]. This is a follow up tele-visit via phone. The patient is at home. MD is at office.   Pain medication is helping. She is feeling better. Her average pain is 8/10. She has been to do a little bit more around the house. She is able to fold clothes when her son brings them in. She has not been able to walk much more. She does walk within the house, to go to the bathroom. She is limited by the knee pain and the radiating back pain, as well as her decreased strength. She has never had an epidural injection into the back.   Discussed the results of her lumbar MRI with her: Abnormal MRI lumbar spine (without) demonstrating: 1. At L4-5: disc bulging, disc protrusion, facet and ligamentum flavum hypertrophy, resulting in severe spinal stenosis and severe biforaminal stenosis. Also degenerative endplate and marrow edema at L4-5 with STIR hyperintense signal. 2. At L3-4: disc bulging, facet hypertrophy with moderate-severe spinal stenosis and severe biforaminal stenosis. 3. At L5-S1: loss of disc space height, facet hypertrophy, left hemi-laminectomy, with no spinal stenosis or foraminal narrowing. 4. Compared to MRI on 10/19/12, there has been significant worsening of degenerative spine disease, especially at L4-5 level.  She had surgery on November 12th, 2019. This did help her and she wen through rehab afterward. But now she feels worse than before the surgery.    Pain Inventory Average Pain 8 Pain Right Now 7 My pain is constant, sharp, burning, dull, stabbing, tingling and aching  In  the last 24 hours, has pain interfered with the following? General activity 10 Relation with others 8 Enjoyment of life 10 What TIME of day is your pain at its worst? evening Sleep (in general) Poor  Pain is worse with: walking, bending, sitting and standing Pain improves with: medication Relief from Meds: 6  Family History  Problem Relation Age of Onset  . Alzheimer's disease Mother   . Heart disease Mother   . Heart disease Father   . Liver disease Father   . Cancer Brother   . Arthritis Son   . Colon polyps Brother   . Colon cancer Neg Hx   . Esophageal cancer Neg Hx   . Kidney disease Neg Hx   . Stomach cancer Neg Hx   . Rectal cancer Neg Hx    Social History   Socioeconomic History  . Marital status: Widowed    Spouse name: Not on file  . Number of children: 2  . Years of education: 44  . Highest education level: Not on file  Occupational History  . Occupation: Retired  Tobacco Use  . Smoking status: Former Smoker    Packs/day: 0.10    Years: 10.00    Pack years: 1.00    Types: Cigarettes    Quit date: 12/07/1979    Years since quitting: 40.4  . Smokeless tobacco: Never Used  Vaping Use  . Vaping Use: Never used  Substance and Sexual Activity  . Alcohol use: No    Alcohol/week: 0.0 standard drinks  . Drug use: No  .  Sexual activity: Not Currently  Other Topics Concern  . Not on file  Social History Narrative   Walks with cane   Right handed    Caffeine use: Coffee (2 cups every morning)   Tea: sometimes   Soda: none   Social Determinants of Radio broadcast assistant Strain:   . Difficulty of Paying Living Expenses: Not on file  Food Insecurity:   . Worried About Charity fundraiser in the Last Year: Not on file  . Ran Out of Food in the Last Year: Not on file  Transportation Needs:   . Lack of Transportation (Medical): Not on file  . Lack of Transportation (Non-Medical): Not on file  Physical Activity:   . Days of Exercise per Week: Not on  file  . Minutes of Exercise per Session: Not on file  Stress:   . Feeling of Stress : Not on file  Social Connections:   . Frequency of Communication with Friends and Family: Not on file  . Frequency of Social Gatherings with Friends and Family: Not on file  . Attends Religious Services: Not on file  . Active Member of Clubs or Organizations: Not on file  . Attends Archivist Meetings: Not on file  . Marital Status: Not on file   Past Surgical History:  Procedure Laterality Date  . ABDOMINAL HYSTERECTOMY  1974  . abdominal tumor  2002  . APPENDECTOMY    . APPLICATION OF A-CELL OF BACK N/A 09/26/2018   Procedure: With Acell;  Surgeon: Wallace Going, DO;  Location: [facility];  Service: Plastics;  Laterality: N/A;  . APPLICATION OF WOUND VAC N/A 09/26/2018   Procedure: Vac placement;  Surgeon: Wallace Going, DO;  Location: [facility];  Service: Plastics;  Laterality: N/A;  . [place]  . BRONCHOSCOPY  2001  . CATARACT EXTRACTION, BILATERAL  09/2019, 10/2019  . CHOLECYSTECTOMY  1984  . INCISION AND DRAINAGE OF WOUND N/A 09/26/2018   Procedure: Debridement of spine wound;  Surgeon: Wallace Going, DO;  Location: [facility];  Service: Plastics;  Laterality: N/A;  . KNEE SURGERY Bilateral 08/27/2010 (L) and 01/11/2011 (R)  . LUMBAR LAMINECTOMY/DECOMPRESSION MICRODISCECTOMY N/A 07/03/2018   Procedure: Lumbar Three to Lumbar Five Laminectomy;  Surgeon: Judith Part, MD;  Location: [facility];  Service: Neurosurgery;  Laterality: N/A;  . LUMBAR WOUND DEBRIDEMENT N/A 08/20/2018   Procedure: POSTERIOR LUMBAR SPINAL WOUND DEBRIDEMENT AND REVISION;  Surgeon: Judith Part, MD;  Location: [facility];  Service: Neurosurgery;  Laterality: N/A;  POSTERIOR LUMBAR SPINAL WOUND REVISION  . [place]  . OVARIAN CYST SURGERY  1968  . thymus tumor    . 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 TONSILLECTOMY     Past Surgical History:  Procedure Laterality Date  . ABDOMINAL HYSTERECTOMY   1974  . abdominal tumor  2002  . APPENDECTOMY    . APPLICATION OF A-CELL OF BACK N/A 09/26/2018   Procedure: With Acell;  Surgeon: Wallace Going, DO;  Location: [facility];  Service: Plastics;  Laterality: N/A;  . APPLICATION OF WOUND VAC N/A 09/26/2018   Procedure: Vac placement;  Surgeon: Wallace Going, DO;  Location: [facility];  Service: Plastics;  Laterality: N/A;  . [place]  . BRONCHOSCOPY  2001  . CATARACT EXTRACTION, BILATERAL  09/2019, 10/2019  . CHOLECYSTECTOMY  1984  . INCISION AND DRAINAGE OF WOUND N/A 09/26/2018   Procedure: Debridement of spine wound;  Surgeon: Wallace Going, DO;  Location: [facility];  Service: Plastics;  Laterality: N/A;  . KNEE SURGERY Bilateral 08/27/2010 (L) and 01/11/2011 (R)  . LUMBAR LAMINECTOMY/DECOMPRESSION MICRODISCECTOMY N/A 07/03/2018   Procedure: Lumbar Three to Lumbar Five Laminectomy;  Surgeon: Judith Part, MD;  Location: [facility];  Service: Neurosurgery;  Laterality: N/A;  . LUMBAR WOUND DEBRIDEMENT N/A 08/20/2018   Procedure: POSTERIOR LUMBAR SPINAL WOUND DEBRIDEMENT AND REVISION;  Surgeon: Judith Part, MD;  Location: [facility];  Service: Neurosurgery;  Laterality: N/A;  POSTERIOR LUMBAR SPINAL WOUND REVISION  . [place]  . OVARIAN CYST SURGERY  1968  . thymus tumor    . 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 TONSILLECTOMY     Past Medical History:  Diagnosis Date  . Abnormality of gait 04/19/2016  . Acute infective polyneuritis ([facility]) 2002  . Allergic rhinitis due to pollen   . Chronic pain syndrome   . COPD (chronic obstructive pulmonary disease) (HCC)    chronic bronchitis  . Depressive disorder, not elsewhere classified   . Diabetes mellitus without complication ([place] [facility])   . Diaphragmatic hernia without mention of obstruction or gangrene   . Dyslipidemia   . Fibromyalgia   . GERD (gastroesophageal reflux disease)    with h/o esophagitis  . Guillain-Barre syndrome ([place])   . History of benign thymus tumor   . Insomnia,  unspecified   . Lumbar spinal stenosis 03/30/2018   L4-5 level, severe  . Miscarriage 1962  . Mixed hyperlipidemia   . Morbid obesity ([place])   . Osteoporosis   . Pneumonia   . Polyneuropathy in diabetes(357.2)   . Rheumatoid arthritis(714.0)   . Spondylosis, lumbosacral   . Spontaneous ecchymoses   . Type II or unspecified type diabetes mellitus with peripheral circulatory disorders, uncontrolled(250.72)   . Unspecified chronic bronchitis ([place])   . Unspecified essential hypertension   . Unspecified hypothyroidism   . Unspecified pruritic disorder   . Unspecified urinary incontinence    BP (!) 124/58 Comment: Per pt- Tele visit  Pulse 66   Ht 5\' 6"  (1.676 m)   Wt 239 lb (108.4 kg) Comment: Tele visit  BMI 38.58 kg/m   Opioid Risk Score:   Fall Risk Score:  `1  Depression screen PHQ 2/9  Depression screen [facility] 2/9 03/12/2020 02/19/2020 02/10/2020 02/05/2020 01/14/2020 09/11/2019 04/18/2019  Decreased Interest 0 0 0 3 0 0 0  Down, Depressed, Hopeless 0 0 0 0 0 0 0  PHQ - 2 Score 0 0 0 3 0 0 0  Altered sleeping - 0 - 3 - - -  Tired, decreased energy - 0 - 2 - - -  Change in appetite - 0 - 2 - - -  Feeling bad or failure about yourself  - 0 - 0 - - -  Trouble concentrating - 0 - 1 - - -  Moving slowly or fidgety/restless - 0 - 0 - - -  Suicidal thoughts - 0 - 0 - - -  PHQ-9 Score - 0 - 11 - - -  Difficult doing work/chores - - - Extremely dIfficult - - -  Some recent data might be hidden   Review of Systems  Constitutional: Negative.   HENT: Negative.   Eyes: Negative.   Respiratory: Negative.   Cardiovascular: Positive for leg swelling.  Gastrointestinal: Negative.   Endocrine: Negative.   Genitourinary: Negative.   Musculoskeletal: Positive for arthralgias, back pain and gait problem.  Skin: Negative.   Allergic/Immunologic: Negative.  Hematological: Negative.   Psychiatric/Behavioral: Negative.        Objective:   Physical Exam Physical exam not performed since  patient was seen via phone.        Assessment & Plan:  Mrs. Puzzo is a 79 year old woman who presents with chronic pain syndrome, failed back syndrome, lumbar stenosis, and bilateral knee osteoarthritis, and severe insomnia.  1) Severe insomnia: Amitriptyline is helping. No side effects. Currently taking 25mg  last visit. Can maintain this dose. Does not need a refill. She is afraid that going higher would make her too groggy during the day.   2) Chronic pain syndrome secondary to lumbar spinal stenosis, failed back syndrome, and bilateral knee osteoarthritis that no longer benefits from injections. She is on Oxycodone but is developing tolerance to this. I have refilled the Norco to 10mg -325mg  four times per day as needed for pain.  Pain contract previously signed and UDS obtained. Discussed role of exercise and anti-inflammatory diet in pain relief. Recommended exercise bike given limited mobility and low carb diet. Reviewed the results of MRI with her (as in HPI). She has made good dietary changes.   3) Obesity class 2: Monitor each visit. Can contribute to chronic disease and worsening pain. Lifestyle changes as above. Weight today is 239 lbs, this is down from 243 last visit. Commended on this.   4) Incontinence of bladder. Discussed timed voiding q2H. Cranberry supplements won't hurt, but help more for UTIs. Can continue Myrbetriq. Discussed that constipation could be a factor.   5) Congestive heart failure: educated that purpose of Lasix is to treat CHF and Myrbetric to treat urinary retention. Advised to follow-up with Dr. Mariea Clonts given her increased weight since last visit, which she attributes to fluid retention. She may benefit from a higher dose of Lasix.   6) Constipation: currently with BM q2day- improved form last vist. Does not feel constipated. At times has diarrhea. Discussed that this could be stool leaking around hard mass. Discussed that mag citrate could be helpful to  clear her out, and OTC magnesium supplement can help with constipation and sleep. She is also on colace.   7) Type 2 DM: CBG 113 this morning. Commended on progress!  15 minutes spent in discussion of pain, mobility, reviewing of MRI result, and possibility of ESI in the future. All questions answered. RTC in 1 month.

## 2020-05-14 ENCOUNTER — Other Ambulatory Visit: Payer: Self-pay | Admitting: Internal Medicine

## 2020-05-15 ENCOUNTER — Other Ambulatory Visit: Payer: Self-pay

## 2020-05-15 ENCOUNTER — Ambulatory Visit (INDEPENDENT_AMBULATORY_CARE_PROVIDER_SITE_OTHER): Payer: Medicare Other | Admitting: Adult Health

## 2020-05-15 ENCOUNTER — Other Ambulatory Visit: Payer: Self-pay | Admitting: Adult Health

## 2020-05-15 VITALS — BP 130/80 | HR 88 | Temp 97.5°F | Ht 66.0 in

## 2020-05-15 DIAGNOSIS — J069 Acute upper respiratory infection, unspecified: Secondary | ICD-10-CM

## 2020-05-15 DIAGNOSIS — R300 Dysuria: Secondary | ICD-10-CM | POA: Diagnosis not present

## 2020-05-15 LAB — POCT URINALYSIS DIPSTICK
Bilirubin, UA: NEGATIVE
Blood, UA: NEGATIVE
Glucose, UA: NEGATIVE
Ketones, UA: NEGATIVE
Nitrite, UA: NEGATIVE
Protein, UA: POSITIVE — AB
Spec Grav, UA: 1.01 (ref 1.010–1.025)
Urobilinogen, UA: 0.2 E.U./dL
pH, UA: 7.5 (ref 5.0–8.0)

## 2020-05-15 LAB — POCT INFLUENZA A/B
Influenza A, POC: NEGATIVE
Influenza B, POC: NEGATIVE

## 2020-05-15 MED ORDER — GUAIFENESIN ER 600 MG PO TB12
600.0000 mg | ORAL_TABLET | Freq: Two times a day (BID) | ORAL | 0 refills | Status: AC
Start: 2020-05-15 — End: 2020-05-25

## 2020-05-15 MED ORDER — SALINE NASAL SPRAY 0.65 % NA SOLN: 2.0000 | NASAL | 0 refills | Status: DC | PRN

## 2020-05-15 MED ORDER — DOXYCYCLINE HYCLATE 100 MG PO TABS: 100.0000 mg | ORAL_TABLET | Freq: Two times a day (BID) | ORAL | 0 refills | Status: AC

## 2020-05-15 MED ORDER — ZINC SULFATE 50 MG PO CAPS: 220.0000 mg | ORAL_CAPSULE | Freq: Every day | ORAL | 0 refills | Status: AC

## 2020-05-15 NOTE — Telephone Encounter (Signed)
Duplicate warning came up. Ok to fill?

## 2020-05-15 NOTE — Patient Instructions (Signed)

## 2020-05-15 NOTE — Progress Notes (Signed)
[facility] clinic  Provider:   Durenda Age - DNP  Code Status:  Full Code  Goals of Care:  Advanced Directives 03/12/2020  Does Patient Have a Medical Advance Directive? Yes  Type of Advance Directive Out of facility DNR (pink MOST or yellow form)  Does patient want to make changes to medical advance directive? No - Patient declined  Copy of Walker in Chart? -  Would patient like information on creating a medical advance directive? -  Pre-existing out of facility DNR order (yellow form or pink MOST form) -     Chief Complaint  Patient presents with   Acute Visit    Head and chest ongestion. Patient came in office with headaches, sinus symptoms, coughing, sore throat. Feels like she has the Flu, but not the Flu. Has had diarrhea body aches for about a week. Some SOB. cough, but nothing comes up.Patient has been fully vaccinated. Does not believe she has been around anyone with COVID. She has been using OTC decongestants.Since 05/11/2020. Patient's highest temp has been 99.0. Both eyes have been red, itchy and sore.Patient thinks she may have UTI.+    HPI: Patient is a 79 y.o. female seen today for an acute visit for head and chest congestion. She has PMH of COPD, diabetes mellitus, Guillain-Barre syndrome and lumbar spinal stenosis. Patient stated that she had been having runny nose and dry cough X 4 days. She complains of muscle pain. Patient reported having temperature of 99. She is fully vaccinated with COVID-19. She denies having been exposed to anyone with COVID-19. Urine dipstick showed positive protein, large leukocytes and cloudy. Infuenza A/B test was negative.   Past Medical History:  Diagnosis Date   Abnormality of gait 04/19/2016   Acute infective polyneuritis ([place]) 2002   Allergic rhinitis due to pollen    Chronic pain syndrome    COPD (chronic obstructive pulmonary disease) (HCC)    chronic bronchitis   Depressive disorder, not elsewhere  classified    Diabetes mellitus without complication ([place])    Diaphragmatic hernia without mention of obstruction or gangrene    Dyslipidemia    Fibromyalgia    GERD (gastroesophageal reflux disease)    with h/o esophagitis   Guillain-Barre syndrome (HCC)    History of benign thymus tumor    Insomnia, unspecified    Lumbar spinal stenosis 03/30/2018   L4-5 level, severe   Miscarriage 1962   Mixed hyperlipidemia    Morbid obesity ([place])    Osteoporosis    Pneumonia    Polyneuropathy in diabetes(357.2)    Rheumatoid arthritis(714.0)    Spondylosis, lumbosacral    Spontaneous ecchymoses    Type II or unspecified type diabetes mellitus with peripheral circulatory disorders, uncontrolled(250.72)    Unspecified chronic bronchitis (HCC)    Unspecified essential hypertension    Unspecified hypothyroidism    Unspecified pruritic disorder    Unspecified urinary incontinence     Past Surgical History:  Procedure Laterality Date   ABDOMINAL HYSTERECTOMY  1974   abdominal tumor  2002   APPENDECTOMY     APPLICATION OF A-CELL OF BACK N/A 09/26/2018   Procedure: With Acell;  Surgeon: Wallace Going, DO;  Location: [place];  Service: Plastics;  Laterality: N/A;   APPLICATION OF WOUND VAC N/A 09/26/2018   Procedure: Vac placement;  Surgeon: Wallace Going, DO;  Location: [place];  Service: Plastics;  Laterality: N/A;   [place]   BRONCHOSCOPY  2001  CATARACT EXTRACTION, BILATERAL  09/2019, 10/2019   CHOLECYSTECTOMY  1984   INCISION AND DRAINAGE OF WOUND N/A 09/26/2018   Procedure: Debridement of spine wound;  Surgeon: Wallace Going, DO;  Location: [place];  Service: Plastics;  Laterality: N/A;   KNEE SURGERY Bilateral 08/27/2010 (L) and 01/11/2011 (R)   LUMBAR LAMINECTOMY/DECOMPRESSION MICRODISCECTOMY N/A 07/03/2018   Procedure: Lumbar Three to Lumbar Five Laminectomy;  Surgeon: Judith Part, MD;  Location: [place];  Service:  Neurosurgery;  Laterality: N/A;   LUMBAR WOUND DEBRIDEMENT N/A 08/20/2018   Procedure: POSTERIOR LUMBAR SPINAL WOUND DEBRIDEMENT AND REVISION;  Surgeon: Judith Part, MD;  Location: [place];  Service: Neurosurgery;  Laterality: N/A;  POSTERIOR LUMBAR SPINAL WOUND REVISION   miscarrage  1962   OVARIAN CYST SURGERY  1968   thymus tumor     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   TONSILLECTOMY      Allergies  Allergen Reactions   Influenza Vaccines Other (See Comments)    "h/o Guillain Barre; dr's told me years ago never to take another flu shot as it could relapse Rosalee Kaufman; has to do with when vaccine being changed to H1N1 virus"   Penicillins Hives, Itching, Swelling and Rash    Has patient had a PCN reaction causing immediate rash, facial/tongue/throat swelling, SOB or lightheadedness with hypotension:Unknown Has patient had a PCN reaction causing severe rash involving mucus membranes or skin necrosis: Unknown Has patient had a PCN reaction that required hospitalization:Unknown Has patient had a PCN reaction occurring within the last 10 years: Unknown If all of the above answers are "NO", then may proceed with Cephalosporin use.    Statins Other (See Comments)    Myopathy, transaminitis   Sulfamethoxazole-Trimethoprim Itching    Outpatient Encounter Medications as of 05/15/2020  Medication Sig   acetaminophen (TYLENOL) 325 MG tablet Take 650 mg by mouth every 6 (six) hours as needed (for pain.).   amitriptyline (ELAVIL) 25 MG tablet Take 1 tablet (25 mg total) by mouth at bedtime.   amLODipine (NORVASC) 10 MG tablet TAKE 1 TABLET(10 MG) BY MOUTH DAILY   aspirin EC 81 MG tablet Take 81 mg by mouth 2 (two) times a week.   cefaclor (CECLOR) 500 MG capsule Take 1 capsule (500 mg total) by mouth 3 (three) times daily.   Chlorphen-Phenyleph-ASA (ALKA-SELTZER PLUS COLD PO) Take by mouth as needed.   cholecalciferol (VITAMIN D3) 25 MCG (1000 UT) tablet Take 1,000 Units by mouth  daily.   docusate sodium (COLACE) 100 MG capsule Take 100 mg by mouth daily as needed (constipation.).    folic acid (FOLVITE) 1 MG tablet TAKE 2 TABLETS BY MOUTH  DAILY   furosemide (LASIX) 20 MG tablet TAKE 1 TABLET BY MOUTH TWICE DAILY AS NEEDED FOR 3 POUND WEIGHT GAIN IN ONE DAY OR 5 POUNDS IN ONE WEEK   gabapentin (NEURONTIN) 600 MG tablet TAKE 1 TABLET(600 MG) BY MOUTH THREE TIMES DAILY   HYDROcodone-acetaminophen (NORCO) 10-325 MG tablet Take 1 tablet by mouth every 6 (six) hours as needed.   Insulin Lispro (HUMALOG KWIKPEN) 200 UNIT/ML SOPN Inject 20 Units into the skin 3 (three) times daily.   Insulin Pen Needle (B-D UF III MINI PEN NEEDLES) 31G X 5 MM MISC USE AS DIRECTED   levothyroxine (SYNTHROID) 137 MCG tablet TAKE 1 TABLET BY MOUTH EVERY DAY 30 MINUTES BEFORE BREAKFAST   lisinopril (ZESTRIL) 20 MG tablet TAKE 1 TABLET(20 MG) BY MOUTH DAILY   loperamide (IMODIUM A-D) 2 MG tablet  Take 2 mg by mouth 4 (four) times daily as needed for diarrhea or loose stools.   magnesium oxide (MAG-OX) 400 MG tablet Take 1 tablet (400 mg total) by mouth daily as needed (for leg cramps).   Melatonin 10 MG TABS Take 10 mg by mouth at bedtime.   metoprolol tartrate (LOPRESSOR) 50 MG tablet Take one tablet by mouth twice daily.   Multiple Vitamins-Minerals (MULTIVITAMIN ADULT PO) Take 1 tablet by mouth daily.   MYRBETRIQ 50 MG TB24 tablet TAKE 1 TABLET(50 MG) BY MOUTH DAILY   nystatin (NYSTATIN) powder APPLY TOPICALLY THREE TIMES DAILY AS DIRECTED FOR 14 DAYS and prn yeast infection   nystatin cream (MYCOSTATIN) Apply 1 application topically 2 (two) times daily. To skin folds   omega-3 acid ethyl esters (LOVAZA) 1 g capsule TAKE ONE CAPSULE BY MOUTH EVERY DAY   polyvinyl alcohol (LIQUIFILM TEARS) 1.4 % ophthalmic solution Place 1 drop into both eyes 4 (four) times daily as needed (for dry/irritated eyes).    potassium chloride (MICRO-K) 10 MEQ CR capsule Take 2 capsules (20 mEq total)  by mouth 2 (two) times daily as needed (when taking a dose of lasix.).   tiZANidine (ZANAFLEX) 2 MG tablet Take 2 mg by mouth at bedtime.   TOUJEO SOLOSTAR 300 UNIT/ML Solostar Pen INJECT 100 UNITS INTO THE SKIN EVERY NIGHT AT BEDTIME   triamterene-hydrochlorothiazide (MAXZIDE-25) 37.5-25 MG tablet TAKE 1 TABLET BY MOUTH DAILY   umeclidinium-vilanterol (ANORO ELLIPTA) 62.5-25 MCG/INH AEPB Inhale 1 puff into the lungs daily.   venlafaxine XR (EFFEXOR-XR) 75 MG 24 hr capsule TAKE 1 CAPSULE BY MOUTH EVERY DAY WITH BREAKFAST   VICTOZA 18 MG/3ML SOPN ADMINISTER 1.8 MG UNDER THE SKIN DAILY FOR BLOOD SUGAR   XELJANZ 5 MG TABS TAKE 1 TABLET BY MOUTH  DAILY   [DISCONTINUED] amLODipine (NORVASC) 10 MG tablet Take 1 tablet (10 mg total) by mouth daily.   No facility-administered encounter medications on file as of 05/15/2020.    Review of Systems:  Review of Systems  Constitutional: Positive for chills and fever. Negative for activity change and appetite change.  HENT: Positive for congestion and rhinorrhea. Negative for mouth sores, sinus pain, sneezing and sore throat.   Eyes: Positive for redness and itching.  Respiratory: Positive for cough. Negative for wheezing.   Cardiovascular: Negative for chest pain and leg swelling.  Gastrointestinal: Negative for abdominal pain and constipation.  Genitourinary: Positive for dysuria.  Musculoskeletal: Negative for neck stiffness.  Skin: Negative.     Health Maintenance  Topic Date Due   FOOT EXAM  04/17/2020   HEMOGLOBIN A1C  08/11/2020   OPHTHALMOLOGY EXAM  10/02/2020   DEXA SCAN  09/20/2025   TETANUS/TDAP  05/28/2026   COVID-19 Vaccine  Completed   Hepatitis C Screening  Completed   PNA vac Low Risk Adult  Completed   INFLUENZA VACCINE  Discontinued    Physical Exam: Vitals:   05/15/20 1404  BP: 130/80  Pulse: 88  Temp: (!) 97.5 F (36.4 C)  SpO2: 98%  Height: 5\' 6"  (1.676 m)   Body mass index is 38.58  kg/m. Physical Exam Constitutional:      Comments: Morbidly obese  HENT:     Head: Normocephalic and atraumatic.     Nose: Nose normal. No congestion or rhinorrhea.     Mouth/Throat:     Mouth: Mucous membranes are moist.     Pharynx: Oropharynx is clear.  Eyes:     General:  Right eye: No discharge.        Left eye: No discharge.     Conjunctiva/sclera: Conjunctivae normal.  Cardiovascular:     Rate and Rhythm: Normal rate and regular rhythm.     Pulses: Normal pulses.     Heart sounds: Normal heart sounds.  Pulmonary:     Effort: Pulmonary effort is normal.     Breath sounds: Normal breath sounds.  Abdominal:     General: Bowel sounds are normal.     Palpations: Abdomen is soft.  Musculoskeletal:        General: Normal range of motion.     Cervical back: Normal range of motion and neck supple.     Right lower leg: No edema.     Left lower leg: No edema.  Skin:    General: Skin is warm and dry.  Neurological:     General: No focal deficit present.     Mental Status: She is alert and oriented to person, place, and time. Mental status is at baseline.  Psychiatric:        Mood and Affect: Mood normal.        Behavior: Behavior normal.        Thought Content: Thought content normal.        Judgment: Judgment normal.     Labs reviewed: Basic Metabolic Panel: Recent Labs    08/02/19 1514 09/02/19 0841 01/24/20 1148  NA 139 141 143  K 4.0 4.6 4.1  CL 99 103 104  CO2 27 31 29   GLUCOSE 203* 122* 80  BUN 14 24 16   CREATININE 0.79 0.97* 0.84  CALCIUM 9.5 9.7 9.5   Liver Function Tests: Recent Labs    08/02/19 1514 01/24/20 1148  AST 10 15  ALT 7 9  BILITOT 0.3 0.3  PROT 6.7 6.7   CBC: Recent Labs    08/02/19 1514 09/02/19 0841 01/24/20 1148  WBC 15.5* 14.8* 16.3*  NEUTROABS 12,028* 10,449* 11,117*  HGB 14.0 13.9 13.9  HCT 43.1 42.6 42.2  MCV 83.0 83.0 84.6  PLT 350 373 368   Lipid Panel: Recent Labs    09/02/19 0841  CHOL 150  HDL  39*  LDLCALC 87  TRIG 139  CHOLHDL 3.8   Lab Results  Component Value Date   HGBA1C 7.7 (H) 02/10/2020     Assessment/Plan  1. URI with cough and congestion -  Will start on Doxycycline, Zinc sulfate, Mucinex and saline nasal spray -  Discussed medications with son and verbalized understanding - SARS-COV-2 RNA,(COVID-19) QUAL NAAT - doxycycline (VIBRA-TABS) 100 MG tablet; Take 1 tablet (100 mg total) by mouth 2 (two) times daily for 10 days.  Dispense: 20 tablet; Refill: 0 - zinc sulfate 50 MG CAPS capsule; Take 1 capsule (220 mg total) by mouth daily for 10 days.  Dispense: 10 capsule; Refill: 0 - guaiFENesin (MUCINEX) 600 MG 12 hr tablet; Take 1 tablet (600 mg total) by mouth 2 (two) times daily for 10 days.  Dispense: 20 tablet; Refill: 0 - sodium chloride (TGT SALINE NASAL SPRAY) 0.65 % nasal spray; Place 2 sprays into the nose as needed for up to 14 days for congestion.  Dispense: 30 mL; Refill: 0 - POC Influenza A/B, was negative  2. Dysuria -  Encouraged to drink water, was started on antibiotics, Doxycycline - POC Urinalysis Dipstick - Culture, Urine    Labs/tests ordered:  Urinalysis with culture and sensitivity, COVID-19 test  Next appt:  06/11/2020

## 2020-05-17 ENCOUNTER — Other Ambulatory Visit: Payer: Self-pay | Admitting: Adult Health

## 2020-05-17 LAB — SARS-COV-2 RNA,(COVID-19) QUALITATIVE NAAT: SARS CoV2 RNA: NOT DETECTED

## 2020-05-17 LAB — URINE CULTURE
MICRO NUMBER:: 10996700
SPECIMEN QUALITY:: ADEQUATE

## 2020-05-17 MED ORDER — NITROFURANTOIN MONOHYD MACRO 100 MG PO CAPS: 100.0000 mg | ORAL_CAPSULE | Freq: Two times a day (BID) | ORAL | 0 refills | Status: AC

## 2020-05-17 MED ORDER — SACCHAROMYCES BOULARDII 250 MG PO CAPS: 250.0000 mg | ORAL_CAPSULE | Freq: Two times a day (BID) | ORAL | 0 refills | Status: AC

## 2020-05-17 NOTE — Progress Notes (Signed)
I have sent a prescription for Nitrofurantoin at her pharmacy, Nitrofurantoin 100 mg twice a day X 5 days. I will send for Probiotics as well.

## 2020-05-17 NOTE — Progress Notes (Signed)
She is negative for COVID-19. If her symptoms are still present and is not getting any better, she needs to call the office and make the appointment to be seen again. Awaiting on her urine culture result.

## 2020-05-18 NOTE — Progress Notes (Signed)
She is on Doxycycline for URI and also now also on Macrobid for UTI.

## 2020-05-20 ENCOUNTER — Encounter: Payer: Self-pay | Admitting: Physical Medicine and Rehabilitation

## 2020-05-20 ENCOUNTER — Other Ambulatory Visit: Payer: Self-pay

## 2020-05-20 ENCOUNTER — Encounter (HOSPITAL_BASED_OUTPATIENT_CLINIC_OR_DEPARTMENT_OTHER): Payer: Medicare Other | Admitting: Physical Medicine and Rehabilitation

## 2020-05-20 DIAGNOSIS — M961 Postlaminectomy syndrome, not elsewhere classified: Secondary | ICD-10-CM | POA: Diagnosis not present

## 2020-05-20 DIAGNOSIS — Z6838 Body mass index (BMI) 38.0-38.9, adult: Secondary | ICD-10-CM | POA: Diagnosis not present

## 2020-05-20 DIAGNOSIS — K5900 Constipation, unspecified: Secondary | ICD-10-CM

## 2020-05-20 DIAGNOSIS — G894 Chronic pain syndrome: Secondary | ICD-10-CM | POA: Diagnosis not present

## 2020-05-20 DIAGNOSIS — M1711 Unilateral primary osteoarthritis, right knee: Secondary | ICD-10-CM

## 2020-05-20 MED ORDER — HYDROCODONE-ACETAMINOPHEN 10-325 MG PO TABS
1.0000 | ORAL_TABLET | Freq: Four times a day (QID) | ORAL | 0 refills | Status: DC | PRN
Start: 2020-05-20 — End: 2020-05-20

## 2020-05-20 MED ORDER — HYDROCODONE-ACETAMINOPHEN 10-325 MG PO TABS
1.0000 | ORAL_TABLET | Freq: Four times a day (QID) | ORAL | 0 refills | Status: DC | PRN
Start: 2020-05-20 — End: 2020-06-19

## 2020-05-20 NOTE — Progress Notes (Signed)
Subjective:    Patient ID: Katherine Delgado, female    DOB: 1941-08-16, 79 y.o.   MRN: [id number]  HPI  Due to national recommendations of social distancing because of COVID 64, an audio/video tele-health visit is felt to be the most appropriate encounter for this patient at this time. See MyChart message from today for the patient's consent to a tele-health encounter with [facility]. This is a follow up tele-visit via Webex. The patient is at home. MD is at office. She is having cold symptoms.  Her cold symptoms are getting better but she did not spread germs to our office members.   Her pain has worsened as a result of her cold, but she does feel that the medication is helping. She would like to maintain the same dose.  She has been eating less and drinking more water and as a result has lost weight and her CBGs are better controlled.  Constipation has been well controlled with Colace.   Her pain has been quite severe. She is feeling better. Her average pain is 8/10. She has been to do a little bit more around the house. She is able to fold clothes when her son brings them in. She has not been able to walk much more. She does walk within the house, to go to the bathroom. She is limited by the knee pain and the radiating back pain, as well as her decreased strength. She has never had an epidural injection into the back.   Discussed the results of her lumbar MRI with her: Abnormal MRI lumbar spine (without) demonstrating: 1. At L4-5: disc bulging, disc protrusion, facet and ligamentum flavum hypertrophy, resulting in severe spinal stenosis and severe biforaminal stenosis. Also degenerative endplate and marrow edema at L4-5 with STIR hyperintense signal. 2. At L3-4: disc bulging, facet hypertrophy with moderate-severe spinal stenosis and severe biforaminal stenosis. 3. At L5-S1: loss of disc space height, facet hypertrophy, left hemi-laminectomy, with no spinal  stenosis or foraminal narrowing. 4. Compared to MRI on 10/19/12, there has been significant worsening of degenerative spine disease, especially at L4-5 level.  She had surgery on November 12th, 2019. This did help her and she wen through rehab afterward. But now she feels worse than before the surgery.    Pain Inventory Average Pain 8 Pain Right Now 7 My pain is constant, sharp, burning, dull, stabbing, tingling and aching  In the last 24 hours, has pain interfered with the following? General activity 10 Relation with others 8 Enjoyment of life 10 What TIME of day is your pain at its worst? evening Sleep (in general) Poor  Pain is worse with: walking, bending, sitting and standing Pain improves with: medication Relief from Meds: 6  Family History  Problem Relation Age of Onset  . Alzheimer's disease Mother   . Heart disease Mother   . Heart disease Father   . Liver disease Father   . Cancer Brother   . Arthritis Son   . Colon polyps Brother   . Colon cancer Neg Hx   . Esophageal cancer Neg Hx   . Kidney disease Neg Hx   . Stomach cancer Neg Hx   . Rectal cancer Neg Hx    Social History   Socioeconomic History  . Marital status: Widowed    Spouse name: Not on file  . Number of children: 2  . Years of education: 25  . Highest education level: Not on file  Occupational History  .  Occupation: Retired  Tobacco Use  . Smoking status: Former Smoker    Packs/day: 0.10    Years: 10.00    Pack years: 1.00    Types: Cigarettes    Quit date: 12/07/1979    Years since quitting: 40.4  . Smokeless tobacco: Never Used  Vaping Use  . Vaping Use: Never used  Substance and Sexual Activity  . Alcohol use: No    Alcohol/week: 0.0 standard drinks  . Drug use: No  . Sexual activity: Not Currently  Other Topics Concern  . Not on file  Social History Narrative   Walks with cane   Right handed    Caffeine use: Coffee (2 cups every morning)   Tea: sometimes   Soda: none    Social Determinants of Radio broadcast assistant Strain:   . Difficulty of Paying Living Expenses: Not on file  Food Insecurity:   . Worried About Charity fundraiser in the Last Year: Not on file  . Ran Out of Food in the Last Year: Not on file  Transportation Needs:   . Lack of Transportation (Medical): Not on file  . Lack of Transportation (Non-Medical): Not on file  Physical Activity:   . Days of Exercise per Week: Not on file  . Minutes of Exercise per Session: Not on file  Stress:   . Feeling of Stress : Not on file  Social Connections:   . Frequency of Communication with Friends and Family: Not on file  . Frequency of Social Gatherings with Friends and Family: Not on file  . Attends Religious Services: Not on file  . Active Member of Clubs or Organizations: Not on file  . Attends Archivist Meetings: Not on file  . Marital Status: Not on file   Past Surgical History:  Procedure Laterality Date  . ABDOMINAL HYSTERECTOMY  1974  . abdominal tumor  2002  . APPENDECTOMY    . APPLICATION OF A-CELL OF BACK N/A 09/26/2018   Procedure: With Acell;  Surgeon: Wallace Going, DO;  Location: [facility];  Service: Plastics;  Laterality: N/A;  . APPLICATION OF WOUND VAC N/A 09/26/2018   Procedure: Vac placement;  Surgeon: Wallace Going, DO;  Location: [facility];  Service: Plastics;  Laterality: N/A;  . [place]  . BRONCHOSCOPY  2001  . CATARACT EXTRACTION, BILATERAL  09/2019, 10/2019  . CHOLECYSTECTOMY  1984  . INCISION AND DRAINAGE OF WOUND N/A 09/26/2018   Procedure: Debridement of spine wound;  Surgeon: Wallace Going, DO;  Location: [facility];  Service: Plastics;  Laterality: N/A;  . KNEE SURGERY Bilateral 08/27/2010 (L) and 01/11/2011 (R)  . LUMBAR LAMINECTOMY/DECOMPRESSION MICRODISCECTOMY N/A 07/03/2018   Procedure: Lumbar Three to Lumbar Five Laminectomy;  Surgeon: Judith Part, MD;  Location: [facility];  Service: Neurosurgery;  Laterality: N/A;  .  LUMBAR WOUND DEBRIDEMENT N/A 08/20/2018   Procedure: POSTERIOR LUMBAR SPINAL WOUND DEBRIDEMENT AND REVISION;  Surgeon: Judith Part, MD;  Location: [facility];  Service: Neurosurgery;  Laterality: N/A;  POSTERIOR LUMBAR SPINAL WOUND REVISION  . [place]  . OVARIAN CYST SURGERY  1968  . thymus tumor    . 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 TONSILLECTOMY     Past Surgical History:  Procedure Laterality Date  . ABDOMINAL HYSTERECTOMY  1974  . abdominal tumor  2002  . APPENDECTOMY    . APPLICATION OF A-CELL OF BACK N/A 09/26/2018   Procedure: With Acell;  Surgeon:  Dillingham, Loel Lofty, DO;  Location: [facility];  Service: Plastics;  Laterality: N/A;  . APPLICATION OF WOUND VAC N/A 09/26/2018   Procedure: Vac placement;  Surgeon: Wallace Going, DO;  Location: [facility];  Service: Plastics;  Laterality: N/A;  . [place]  . BRONCHOSCOPY  2001  . CATARACT EXTRACTION, BILATERAL  09/2019, 10/2019  . CHOLECYSTECTOMY  1984  . INCISION AND DRAINAGE OF WOUND N/A 09/26/2018   Procedure: Debridement of spine wound;  Surgeon: Wallace Going, DO;  Location: [facility];  Service: Plastics;  Laterality: N/A;  . KNEE SURGERY Bilateral 08/27/2010 (L) and 01/11/2011 (R)  . LUMBAR LAMINECTOMY/DECOMPRESSION MICRODISCECTOMY N/A 07/03/2018   Procedure: Lumbar Three to Lumbar Five Laminectomy;  Surgeon: Judith Part, MD;  Location: [facility];  Service: Neurosurgery;  Laterality: N/A;  . LUMBAR WOUND DEBRIDEMENT N/A 08/20/2018   Procedure: POSTERIOR LUMBAR SPINAL WOUND DEBRIDEMENT AND REVISION;  Surgeon: Judith Part, MD;  Location: [facility];  Service: Neurosurgery;  Laterality: N/A;  POSTERIOR LUMBAR SPINAL WOUND REVISION  . [place]  . OVARIAN CYST SURGERY  1968  . thymus tumor    . 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 TONSILLECTOMY     Past Medical History:  Diagnosis Date  . Abnormality of gait 04/19/2016  . Acute infective polyneuritis ([place]) 2002  . Allergic rhinitis due to pollen   . Chronic pain  syndrome   . COPD (chronic obstructive pulmonary disease) (HCC)    chronic bronchitis  . Depressive disorder, not elsewhere classified   . Diabetes mellitus without complication ([place])   . Diaphragmatic hernia without mention of obstruction or gangrene   . Dyslipidemia   . Fibromyalgia   . GERD (gastroesophageal reflux disease)    with h/o esophagitis  . Guillain-Barre syndrome ([place])   . History of benign thymus tumor   . Insomnia, unspecified   . Lumbar spinal stenosis 03/30/2018   L4-5 level, severe  . Miscarriage 1962  . Mixed hyperlipidemia   . Morbid obesity ([place])   . Osteoporosis   . Pneumonia   . Polyneuropathy in diabetes(357.2)   . Rheumatoid arthritis(714.0)   . Spondylosis, lumbosacral   . Spontaneous ecchymoses   . Type II or unspecified type diabetes mellitus with peripheral circulatory disorders, uncontrolled(250.72)   . Unspecified chronic bronchitis ([place])   . Unspecified essential hypertension   . Unspecified hypothyroidism   . Unspecified pruritic disorder   . Unspecified urinary incontinence    There were no vitals taken for this visit.  Opioid Risk Score:   Fall Risk Score:  `1  Depression screen PHQ 2/9  Depression screen [facility] 2/9 03/12/2020 02/19/2020 02/10/2020 02/05/2020 01/14/2020 09/11/2019 04/18/2019  Decreased Interest 0 0 0 3 0 0 0  Down, Depressed, Hopeless 0 0 0 0 0 0 0  PHQ - 2 Score 0 0 0 3 0 0 0  Altered sleeping - 0 - 3 - - -  Tired, decreased energy - 0 - 2 - - -  Change in appetite - 0 - 2 - - -  Feeling bad or failure about yourself  - 0 - 0 - - -  Trouble concentrating - 0 - 1 - - -  Moving slowly or fidgety/restless - 0 - 0 - - -  Suicidal thoughts - 0 - 0 - - -  PHQ-9 Score - 0 - 11 - - -  Difficult doing work/chores - - - Extremely dIfficult - - -  Some recent data  might be hidden   Review of Systems  Constitutional: Negative.   HENT: Negative.   Eyes: Negative.   Respiratory: Negative.   Cardiovascular: Positive for leg  swelling.  Gastrointestinal: Negative.   Endocrine: Negative.   Genitourinary: Negative.   Musculoskeletal: Positive for arthralgias, back pain and gait problem.  Skin: Negative.   Allergic/Immunologic: Negative.   Hematological: Negative.   Psychiatric/Behavioral: Negative.        Objective:   Physical Exam Physical exam not performed since patient was seen via phone.        Assessment & Plan:  Mrs. Dicocco is a 79 year old woman who presents with chronic pain syndrome, failed back syndrome, lumbar stenosis, and bilateral knee osteoarthritis, and severe insomnia.  1) Severe insomnia: Amitriptyline is helping. No side effects. Currently taking 25mg  last visit. Can maintain this dose. Does not need a refill. She is afraid that going higher would make her too groggy during the day.   2) Chronic pain syndrome secondary to lumbar spinal stenosis, failed back syndrome, and bilateral knee osteoarthritis that no longer benefits from injections. I have refilled Norco to 10mg -325mg  four times per day as needed for pain.  Pain contract previously signed and UDS obtained. Discussed role of exercise and anti-inflammatory diet in pain relief. Recommended exercise bike given limited mobility and low carb diet. Reviewed the results of MRI with her (as in HPI). She has made good dietary changes. The medication has been helping.   3) Obesity class 2: Monitor each visit. Can contribute to chronic disease and worsening pain. Lifestyle changes as above. Weight today is 239 lbs, this is down from 243 last visit. Commended on this.   4) Incontinence of bladder. Discussed timed voiding q2H. Cranberry supplements won't hurt, but help more for UTIs. Can continue Myrbetriq. Discussed that constipation could be a factor.   5) Congestive heart failure: educated that purpose of Lasix is to treat CHF and Myrbetric to treat urinary retention. Advised to follow-up with Dr. Mariea Clonts given her increased weight since  last visit, which she attributes to fluid retention. She may benefit from a higher dose of Lasix.   6) Constipation:  Continue colace which is helping constipation.   7) Type 2 DM: Commended on progress! Glucose has been stable- she has been eating less sugar and drinking more water.   10 minutes spent in discussion of her pain, CBGs, constipation, weight

## 2020-06-02 ENCOUNTER — Other Ambulatory Visit: Payer: Self-pay | Admitting: Physician Assistant

## 2020-06-09 ENCOUNTER — Telehealth: Payer: Self-pay | Admitting: *Deleted

## 2020-06-09 DIAGNOSIS — R350 Frequency of micturition: Secondary | ICD-10-CM

## 2020-06-09 DIAGNOSIS — R300 Dysuria: Secondary | ICD-10-CM

## 2020-06-09 NOTE — Telephone Encounter (Signed)
Patient stated that it never has really gone away.

## 2020-06-09 NOTE — Telephone Encounter (Signed)
We can add the UA c+s but if she had been infected this whole time, I would have expected her to get seriously ill.  I suspect this is more due to atrophic vaginitis, possibly yeast and her overactive bladder.

## 2020-06-09 NOTE — Telephone Encounter (Signed)
Has she had burning since July?  Did it ever go away?  Seems like there must be some other problem down there if she has continuous burning.

## 2020-06-09 NOTE — Telephone Encounter (Signed)
Patient stated that she has labs scheduled for 06/11/2020 and she is still having Burning when urinates and Frequency and wants to know if you will add a U/A and Culture.

## 2020-06-10 NOTE — Telephone Encounter (Signed)
Orders added

## 2020-06-11 ENCOUNTER — Other Ambulatory Visit: Payer: Self-pay

## 2020-06-11 ENCOUNTER — Other Ambulatory Visit: Payer: Medicare Other

## 2020-06-11 DIAGNOSIS — E1142 Type 2 diabetes mellitus with diabetic polyneuropathy: Secondary | ICD-10-CM

## 2020-06-11 DIAGNOSIS — Z794 Long term (current) use of insulin: Secondary | ICD-10-CM

## 2020-06-11 DIAGNOSIS — R300 Dysuria: Secondary | ICD-10-CM

## 2020-06-11 DIAGNOSIS — D720 Genetic anomalies of leukocytes: Secondary | ICD-10-CM

## 2020-06-11 DIAGNOSIS — E1169 Type 2 diabetes mellitus with other specified complication: Secondary | ICD-10-CM | POA: Diagnosis not present

## 2020-06-11 DIAGNOSIS — Z79899 Other long term (current) drug therapy: Secondary | ICD-10-CM

## 2020-06-11 DIAGNOSIS — L84 Corns and callosities: Secondary | ICD-10-CM

## 2020-06-11 DIAGNOSIS — R350 Frequency of micturition: Secondary | ICD-10-CM | POA: Diagnosis not present

## 2020-06-11 DIAGNOSIS — E785 Hyperlipidemia, unspecified: Secondary | ICD-10-CM

## 2020-06-11 NOTE — Progress Notes (Signed)
Office Visit Note  Patient: Katherine Delgado             Date of Birth: 1940/09/20           MRN: [id number]             PCP: Gayland Curry, DO Referring: Gayland Curry, DO Visit Date: 06/25/2020 Occupation: @GUAROCC @  Subjective:  Medication monitoring.   History of Present Illness: Katherine Delgado is a 79 y.o. female with history of rheumatoid arthritis, Sjogren's, osteoarthritis and fibromyalgia.  She states in October she developed an upper respiratory tract infection.  At the time she was off Katherine Delgado and was treated with antibiotics.  She resumed Katherine Delgado after the infection was cleared.  She continues to have some discomfort all over from fibromyalgia.  She denies any joint swelling.  She states most of the discomfort is coming from osteoarthritis and fibromyalgia currently.  Activities of Daily Living:  Patient reports morning stiffness for all day.  Patient Reports nocturnal pain.  Difficulty dressing/grooming: Denies Difficulty climbing stairs: Reports Difficulty getting out of chair: Reports Difficulty using hands for taps, buttons, cutlery, and/or writing: Reports  Review of Systems  Constitutional: Positive for fatigue.  HENT: Positive for mouth dryness. Negative for mouth sores and nose dryness.   Eyes: Negative for pain, itching and dryness.  Respiratory: Negative for shortness of breath and difficulty breathing.   Cardiovascular: Positive for palpitations. Negative for chest pain.  Gastrointestinal: Negative for blood in stool, constipation and diarrhea.  Endocrine: Negative for increased urination.  Genitourinary: Positive for involuntary urination. Negative for difficulty urinating.  Musculoskeletal: Positive for arthralgias, joint pain, joint swelling, myalgias, morning stiffness, muscle tenderness and myalgias.  Skin: Positive for rash. Negative for color change.  Allergic/Immunologic: Negative for susceptible to infections.  Neurological: Positive for numbness  and weakness. Negative for dizziness, headaches and memory loss.  Hematological: Positive for bruising/bleeding tendency.  Psychiatric/Behavioral: Negative for confusion.    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   Primary osteoarthritis of right knee 07/25/2019   Wound dehiscence 08/17/2018   Lumbar spinal stenosis 03/30/2018   Rheumatoid arthritis involving both wrists with positive rheumatoid factor ([place]) 06/14/2016   High risk medication use 06/14/2016   Sjogren's syndrome ([place]) 06/14/2016   Primary osteoarthritis of both knees 06/14/2016   DDD (degenerative disc disease), lumbar 06/14/2016   Hypothyroidism 06/14/2016   Osteoporosis 06/14/2016   Abnormality of gait 04/19/2016   Type 2 diabetes mellitus with diabetic polyneuropathy, with long-term current use of insulin ([place]) 11/27/2015   Diabetes mellitus without complication ([place]) 73/41/9379   Headache(784.0) 07/31/2013   Sinus infection 07/31/2013   Chronic right-sided low back pain with right-sided sciatica 03/14/2013   Insomnia 12/06/2012   Nausea alone 12/06/2012   Diarrhea 12/06/2012   Urinary incontinence, urge 12/06/2012   Lumbosacral root lesions, not elsewhere classified 11/27/2012   Diabetic polyneuropathy associated with type 2 diabetes mellitus ([place]) 11/27/2012   EDEMA 05/04/2010   YEAST INFECTION 05/03/2010   Hyperlipidemia 05/03/2010   DEPRESSION 05/03/2010   History of peripheral neuropathy 05/03/2010   Essential hypertension 05/03/2010   ALLERGIC RHINITIS 05/03/2010   PNEUMONIA 05/03/2010   COPD (chronic obstructive pulmonary disease) ([place]) 05/03/2010   GERD 05/03/2010   Fibromyalgia 05/03/2010   DYSPNEA 05/03/2010   CHEST PAIN 05/03/2010    Past Medical History:  Diagnosis Date   Abnormality of gait 04/19/2016   Acute infective  polyneuritis ([place]) 2002   Allergic rhinitis due to  pollen    Chronic pain syndrome    COPD (chronic obstructive pulmonary disease) (HCC)    chronic bronchitis   Depressive disorder, not elsewhere classified    Diabetes mellitus without complication ([place])    Diaphragmatic hernia without mention of obstruction or gangrene    Dyslipidemia    Fibromyalgia    GERD (gastroesophageal reflux disease)    with h/o esophagitis   Guillain-Barre syndrome (HCC)    History of benign thymus tumor    Insomnia, unspecified    Lumbar spinal stenosis 03/30/2018   L4-5 level, severe   Miscarriage 1962   Mixed hyperlipidemia    Morbid obesity ([place])    Osteoporosis    Pneumonia    Polyneuropathy in diabetes(357.2)    Rheumatoid arthritis(714.0)    Spondylosis, lumbosacral    Spontaneous ecchymoses    Type II or unspecified type diabetes mellitus with peripheral circulatory disorders, uncontrolled(250.72)    Unspecified chronic bronchitis (HCC)    Unspecified essential hypertension    Unspecified hypothyroidism    Unspecified pruritic disorder    Unspecified urinary incontinence     Family History  Problem Relation Age of Onset   Alzheimer's disease Mother    Heart disease Mother    Heart disease Father    Liver disease Father    Cancer Brother    Arthritis Son    Colon polyps Brother    Colon cancer Neg Hx    Esophageal cancer Neg Hx    Kidney disease Neg Hx    Stomach cancer Neg Hx    Rectal cancer Neg Hx    Past Surgical History:  Procedure Laterality Date   ABDOMINAL HYSTERECTOMY  1974   abdominal tumor  2002   APPENDECTOMY     APPLICATION OF A-CELL OF BACK N/A 09/26/2018   Procedure: With Acell;  Surgeon: Wallace Going, DO;  Location: [facility];  Service: Plastics;  Laterality: N/A;   APPLICATION OF WOUND VAC N/A 09/26/2018   Procedure: Vac placement;  Surgeon: Wallace Going, DO;  Location: [facility];  Service: Plastics;  Laterality: N/A;   BACK SURGERY  1982   BRONCHOSCOPY  2001    CATARACT EXTRACTION, BILATERAL  09/2019, 10/2019   CHOLECYSTECTOMY  1984   INCISION AND DRAINAGE OF WOUND N/A 09/26/2018   Procedure: Debridement of spine wound;  Surgeon: Wallace Going, DO;  [facility];  Service: Plastics;  Laterality: N/A;   KNEE SURGERY Bilateral 08/27/2010 (L) and 01/11/2011 (R)   LUMBAR LAMINECTOMY/DECOMPRESSION MICRODISCECTOMY N/A 07/03/2018   Procedure: Lumbar Three to Lumbar Five Laminectomy;  Surgeon: Judith Part, MD;  Location: [facility];  Service: Neurosurgery;  Laterality: N/A;   LUMBAR WOUND DEBRIDEMENT N/A 08/20/2018   Procedure: POSTERIOR LUMBAR SPINAL WOUND DEBRIDEMENT AND REVISION;  Surgeon: Judith Part, MD;  Location: [facility];  Service: Neurosurgery;  Laterality: N/A;  POSTERIOR LUMBAR SPINAL WOUND REVISION   miscarrage  1962   OVARIAN CYST SURGERY  1968   thymus tumor     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   TONSILLECTOMY     Social History   Social History Narrative   Walks with cane   Right handed    Caffeine use: Coffee (2 cups every morning)   Tea: sometimes   Soda: none   Immunization History  Administered Date(s) Administered   Influenza-Unspecified 06/10/2011   PFIZER SARS-COV-2 Vaccination 11/13/2019, 11/30/2019   Pneumococcal Conjugate-13 07/07/2016   Pneumococcal Polysaccharide-23 11/21/2003,  10/05/2017   Tdap 05/28/2016     Objective: Vital Signs: BP 132/74 (BP Location: Left Arm, Patient Position: Sitting, Cuff Size: Normal)    Pulse 72    Resp 16    Ht 5\' 6"  (1.676 m)    Wt 237 lb (107.5 kg) Comment: per patient, patient in wheelchair   BMI 38.25 kg/m    Physical Exam Vitals and nursing note reviewed.  Constitutional:      Appearance: She is well-developed.  HENT:     Head: Normocephalic and atraumatic.  Eyes:     Conjunctiva/sclera: Conjunctivae normal.  Cardiovascular:     Rate and Rhythm: Normal rate and regular rhythm.     Heart sounds: Normal heart sounds.  Pulmonary:     Effort: Pulmonary  effort is normal.     Breath sounds: Normal breath sounds.  Abdominal:     General: Bowel sounds are normal.     Palpations: Abdomen is soft.  Musculoskeletal:     Cervical back: Normal range of motion.  Lymphadenopathy:     Cervical: No cervical adenopathy.  Skin:    General: Skin is warm and dry.     Capillary Refill: Capillary refill takes less than 2 seconds.  Neurological:     Mental Status: She is alert and oriented to person, place, and time.  Psychiatric:        Behavior: Behavior normal.      Musculoskeletal Exam: Patient is wheelchair-bound.  She has good lateral rotation of the cervical spine.  Shoulder joints and elbow joints with good range of motion.  She has bilateral CMC PIP and DIP thickening.  She has MCP thickening with no active synovitis.  Hip joints were difficult to assess in the wheelchair.  She had good range of motion of her knee joints without swelling.  She had no tenderness over MTPs or PIPs.  No tenderness over ankle joints was noted.  CDAI Exam: CDAI Score: 0.8  Patient Global: 4 mm; Provider Global: 4 mm Swollen: 0 ; Tender: 0  Joint Exam 06/25/2020   No joint exam has been documented for this visit   There is currently no information documented on the homunculus. Go to the Rheumatology activity and complete the homunculus joint exam.  Investigation: No additional findings.  Imaging: No results found.  Recent Labs: Lab Results  Component Value Date   WBC 11.8 (H) 06/11/2020   HGB 14.4 06/11/2020   PLT 333 06/11/2020   NA 140 06/11/2020   K 4.2 06/11/2020   CL 100 06/11/2020   CO2 30 06/11/2020   GLUCOSE 127 (H) 06/11/2020   BUN 21 06/11/2020   CREATININE 1.11 (H) 06/11/2020   BILITOT 0.4 06/11/2020   ALKPHOS 113 02/20/2017   AST 18 06/11/2020   ALT 13 06/11/2020   PROT 7.1 06/11/2020   ALBUMIN 2.6 (L) 08/22/2018   CALCIUM 9.9 06/11/2020   GFRAA 55 (L) 06/11/2020   QFTBGOLDPLUS NEGATIVE 06/11/2020   June 11, 2020 fasting  lipid panel showed LDL 98, HDL 40  Speciality Comments: No specialty comments available.  Procedures:  No procedures performed Allergies: Influenza vaccines, Penicillins, Statins, and Sulfamethoxazole-trimethoprim   Assessment / Plan:     Visit Diagnoses: Rheumatoid arthritis involving multiple sites with positive rheumatoid factor (HCC)-patient has no active synovitis.  She continues to have some discomfort from underlying osteoarthritis and fibromyalgia.  High risk medication use - Katherine Delgado 5 mg 1 tablet daily. D/c Katherine Delgado-SE of diarrhea, d/c Katherine Delgado due to recurent infections.  She  was off Katherine Delgado for few weeks due to upper respiratory tract infection then resume Katherine Delgado.  Labs are stable.  She has chronic neutrophilia.  Her GFR was low recently.  We will continue to monitor.  New black box warning on Katherine Delgado was discussed which includes increased risk of clotting, heart attack and stroke.  She was advised to drink a lot of fluids and stay as active as possible.  She also takes aspirin on a regular basis.  Sjogren's syndrome with keratoconjunctivitis sicca (HCC)-she continues to have sicca symptoms.  Over-the-counter products were discussed.  Fibromyalgia-she has generalized pain and discomfort from fibromyalgia.  Primary osteoarthritis of both knees-he has chronic discomfort in her bilateral knee joint due to severe arthritis.  DDD (degenerative disc disease), lumbar - Referred to pain management   Age-related osteoporosis without current pathological fracture-patient states that her bone density is done by her PCP.  I do not have results available to review.  Other medical problems are listed as follows:  Other insomnia  History of Guillain-Barre syndrome  History of gastroesophageal reflux (GERD)  History of diabetes mellitus  History of depression  History of COPD  History of peripheral neuropathy  History of hypertension  Educated about COVID-19 virus infection-she is fully  vaccinated against COVID-19.  She needs a booster.  Instructions were placed in the AVS.  Orders: No orders of the defined types were placed in this encounter.  No orders of the defined types were placed in this encounter.  .  Follow-Up Instructions: Return in about 5 months (around 11/23/2020) for Rheumatoid arthritis, Sjogren's.   Katherine Merino, MD  Note - This record has been created using Editor, commissioning.  Chart creation errors have been sought, but may not always  have been located. Such creation errors do not reflect on  the standard of medical care.

## 2020-06-12 LAB — URINE CULTURE
MICRO NUMBER:: 11102208
SPECIMEN QUALITY:: ADEQUATE

## 2020-06-12 LAB — COMPLETE METABOLIC PANEL WITH GFR
AG Ratio: 1.4 (calc) (ref 1.0–2.5)
ALT: 13 U/L (ref 6–29)
AST: 18 U/L (ref 10–35)
Albumin: 4.2 g/dL (ref 3.6–5.1)
Alkaline phosphatase (APISO): 102 U/L (ref 37–153)
BUN/Creatinine Ratio: 19 (calc) (ref 6–22)
BUN: 21 mg/dL (ref 7–25)
CO2: 30 mmol/L (ref 20–32)
Calcium: 9.9 mg/dL (ref 8.6–10.4)
Chloride: 100 mmol/L (ref 98–110)
Creat: 1.11 mg/dL — ABNORMAL HIGH (ref 0.60–0.93)
GFR, Est African American: 55 mL/min/{1.73_m2} — ABNORMAL LOW (ref >=60)
GFR, Est Non African American: 47 mL/min/{1.73_m2} — ABNORMAL LOW (ref >=60)
Globulin: 2.9 g/dL (calc) (ref 1.9–3.7)
Glucose, Bld: 127 mg/dL — ABNORMAL HIGH (ref 65–99)
Potassium: 4.2 mmol/L (ref 3.5–5.3)
Sodium: 140 mmol/L (ref 135–146)
Total Bilirubin: 0.4 mg/dL (ref 0.2–1.2)
Total Protein: 7.1 g/dL (ref 6.1–8.1)

## 2020-06-12 LAB — CBC WITH DIFFERENTIAL/PLATELET
Absolute Monocytes: 1050 cells/uL — ABNORMAL HIGH (ref 200–950)
Basophils Absolute: 106 cells/uL (ref 0–200)
Basophils Relative: 0.9 %
Eosinophils Absolute: 555 cells/uL — ABNORMAL HIGH (ref 15–500)
Eosinophils Relative: 4.7 %
HCT: 44.8 % (ref 35.0–45.0)
Hemoglobin: 14.4 g/dL (ref 11.7–15.5)
Lymphs Abs: 1569 cells/uL (ref 850–3900)
MCH: 27.3 pg (ref 27.0–33.0)
MCHC: 32.1 g/dL (ref 32.0–36.0)
MCV: 84.8 fL (ref 80.0–100.0)
MPV: 10.8 fL (ref 7.5–12.5)
Monocytes Relative: 8.9 %
Neutro Abs: 8520 cells/uL — ABNORMAL HIGH (ref 1500–7800)
Neutrophils Relative %: 72.2 %
Platelets: 333 10*3/uL (ref 140–400)
RBC: 5.28 10*6/uL — ABNORMAL HIGH (ref 3.80–5.10)
RDW: 12.9 % (ref 11.0–15.0)
Total Lymphocyte: 13.3 %
WBC: 11.8 10*3/uL — ABNORMAL HIGH (ref 3.8–10.8)

## 2020-06-12 LAB — LIPID PANEL
Cholesterol: 167 mg/dL (ref <200)
HDL: 40 mg/dL — ABNORMAL LOW (ref >=50)
LDL Cholesterol (Calc): 98 mg/dL (calc)
Non-HDL Cholesterol (Calc): 127 mg/dL (calc) (ref <130)
Total CHOL/HDL Ratio: 4.2 (calc) (ref <5.0)
Triglycerides: 197 mg/dL — ABNORMAL HIGH (ref <150)

## 2020-06-12 LAB — URINALYSIS, ROUTINE W REFLEX MICROSCOPIC
Bilirubin Urine: NEGATIVE
Glucose, UA: NEGATIVE
Hyaline Cast: NONE SEEN /LPF
Ketones, ur: NEGATIVE
Nitrite: NEGATIVE
Specific Gravity, Urine: 1.015 (ref 1.001–1.03)
WBC, UA: >=60 /HPF — AB (ref 0–5)
pH: 6.5 (ref 5.0–8.0)

## 2020-06-12 LAB — HEMOGLOBIN A1C
Hgb A1c MFr Bld: 8.4 % of total Hgb — ABNORMAL HIGH (ref <5.7)
Mean Plasma Glucose: 194 (calc)
eAG (mmol/L): 10.8 (calc)

## 2020-06-12 LAB — TSH: TSH: 0.19 mIU/L — ABNORMAL LOW (ref 0.40–4.50)

## 2020-06-12 NOTE — Progress Notes (Signed)
Positive UA, await culture. TSH is now low (had been normal for 3 yrs) White blood cell count is lower than before (it runs chronically high) Kidney function has decreased suggesting inadequate hydration or due to her extensive list of meds and if she's been using any nsaid meds that I've advised against for years) Triglycerides are high again, LDL is still above goal (bad cholesterol) We'll need to address these at her visit

## 2020-06-12 NOTE — Progress Notes (Signed)
For some reason there are two UAs in here for her.  Still awaiting culture.  Hopefully she is not charged for 2.

## 2020-06-13 LAB — URINE CULTURE
MICRO NUMBER:: 11103784
SPECIMEN QUALITY:: ADEQUATE

## 2020-06-13 LAB — URINALYSIS
Bilirubin Urine: NEGATIVE
Glucose, UA: NEGATIVE
Ketones, ur: NEGATIVE
Nitrite: NEGATIVE
Specific Gravity, Urine: 1.011 (ref 1.001–1.03)
pH: 6.5 (ref 5.0–8.0)

## 2020-06-13 LAB — QUANTIFERON-TB GOLD PLUS
Mitogen-NIL: >10 IU/mL
NIL: 0.07 IU/mL
QuantiFERON-TB Gold Plus: NEGATIVE
TB1-NIL: 0 IU/mL
TB2-NIL: <0 IU/mL

## 2020-06-15 ENCOUNTER — Ambulatory Visit (INDEPENDENT_AMBULATORY_CARE_PROVIDER_SITE_OTHER): Payer: Medicare Other | Admitting: Internal Medicine

## 2020-06-15 ENCOUNTER — Encounter: Payer: Self-pay | Admitting: Internal Medicine

## 2020-06-15 ENCOUNTER — Other Ambulatory Visit: Payer: Self-pay

## 2020-06-15 VITALS — BP 138/62 | HR 79 | Temp 98.4°F | Ht 66.0 in | Wt 238.8 lb

## 2020-06-15 DIAGNOSIS — N3281 Overactive bladder: Secondary | ICD-10-CM

## 2020-06-15 DIAGNOSIS — T466X5A Adverse effect of antihyperlipidemic and antiarteriosclerotic drugs, initial encounter: Secondary | ICD-10-CM

## 2020-06-15 DIAGNOSIS — E1169 Type 2 diabetes mellitus with other specified complication: Secondary | ICD-10-CM

## 2020-06-15 DIAGNOSIS — M48062 Spinal stenosis, lumbar region with neurogenic claudication: Secondary | ICD-10-CM | POA: Diagnosis not present

## 2020-06-15 DIAGNOSIS — E785 Hyperlipidemia, unspecified: Secondary | ICD-10-CM | POA: Diagnosis not present

## 2020-06-15 DIAGNOSIS — R600 Localized edema: Secondary | ICD-10-CM | POA: Diagnosis not present

## 2020-06-15 DIAGNOSIS — Z794 Long term (current) use of insulin: Secondary | ICD-10-CM

## 2020-06-15 DIAGNOSIS — G720 Drug-induced myopathy: Secondary | ICD-10-CM | POA: Diagnosis not present

## 2020-06-15 DIAGNOSIS — J42 Unspecified chronic bronchitis: Secondary | ICD-10-CM | POA: Diagnosis not present

## 2020-06-15 DIAGNOSIS — E1142 Type 2 diabetes mellitus with diabetic polyneuropathy: Secondary | ICD-10-CM

## 2020-06-15 DIAGNOSIS — E876 Hypokalemia: Secondary | ICD-10-CM

## 2020-06-15 DIAGNOSIS — E039 Hypothyroidism, unspecified: Secondary | ICD-10-CM | POA: Diagnosis not present

## 2020-06-15 MED ORDER — LEVOTHYROXINE SODIUM 125 MCG PO TABS: ORAL_TABLET | ORAL | 1 refills | Status: DC

## 2020-06-15 MED ORDER — TIOTROPIUM BROMIDE MONOHYDRATE 18 MCG IN CAPS: 18.0000 ug | ORAL_CAPSULE | Freq: Every day | RESPIRATORY_TRACT | 3 refills | Status: DC

## 2020-06-15 MED ORDER — GEMTESA 75 MG PO TABS: 1.0000 | ORAL_TABLET | Freq: Every day | ORAL | 0 refills | Status: DC

## 2020-06-15 MED ORDER — FUROSEMIDE 20 MG PO TABS: ORAL_TABLET | ORAL | 0 refills | Status: DC

## 2020-06-15 MED ORDER — POTASSIUM CHLORIDE ER 10 MEQ PO CPCR: 20.0000 meq | ORAL_CAPSULE | Freq: Two times a day (BID) | ORAL | 0 refills | Status: DC | PRN

## 2020-06-15 MED ORDER — EZETIMIBE 10 MG PO TABS: 10.0000 mg | ORAL_TABLET | Freq: Every day | ORAL | 3 refills | Status: DC

## 2020-06-15 NOTE — Progress Notes (Signed)
Location:  [facility] clinic Provider:  Glenora Morocho L. Mariea Clonts, D.O., C.M.D.  Goals of Care:  Advanced Directives 06/15/2020  Does Patient Have a Medical Advance Directive? Yes  Type of Advance Directive Out of facility DNR (pink MOST or yellow form)  Does patient want to make changes to medical advance directive? No - Patient declined  Copy of [place] in Chart? -  Would patient like information on creating a medical advance directive? -  Pre-existing out of facility DNR order (yellow form or pink MOST form) -     Chief Complaint  Patient presents with  . Medical Management of Chronic Issues    4 month follow up   . Acute Visit    lab results and she would like a copy. Question about inhaler.Changing thyroid medication she's been feeling extremly tired lately     HPI: Patient is a 79 y.o. female seen today for medical management of chronic diseases.    She had been sick--not the flu, not covid.  Had some kind of cold though recently.    TSH 0.19.  Will decrease levothyroxine.    hba1c 8.4.  On victoza, toujeo 100 units nightly, humalog 20 units at mealtimes.  If 110-120s, takes less--8-10 units.   Had hba1c 7.7 4 mos ago.  She's not having lows now which is good.  She talks about the prepared foods.  Tries to stick with individual frozen veggies and less of the full meals.  She does cheat once in a while and have a cookie.  She does try not to.    She continues to have burning, hurting when she urinates.  She's so incontinent.  It's expensive.  Using myrbetriq 50mg .  Has tried cranberry pills.  She's tried to schedule her trips to the bathroom.  She did the kegels.  Will see if she can do samples of gemtesa though I doubt it will work better.  Discussed back causing the problems she has due to nerve damage.  She has anoro one puff per day.  Feels like some of her others worked better.  Knows she has more congestion than she's had from her cold.    She's still seeing pain  mgt and she discussed possibly doing another MRI.    Cholesterol:  Bad is higher than it should be with her diabetes.  Does not eat a lot of meats, breakfast meats.  Does not eat a lot of pasta, potatoes, bread, snacks.  She had transaminitis and myopathy from statins before.    Reports the lasix she has is long expired.  Also needs fresh potassium for when she takes it.    Needs to f/u with podiatry re: left great toe.  Right 4th toe has blister with bandaid.  Right great toe with small abrasion on it.     Past Medical History:  Diagnosis Date  . Abnormality of gait 04/19/2016  . Acute infective polyneuritis ([place]) 2002  . Allergic rhinitis due to pollen   . Chronic pain syndrome   . COPD (chronic obstructive pulmonary disease) (HCC)    chronic bronchitis  . Depressive disorder, not elsewhere classified   . Diabetes mellitus without complication ([place])   . Diaphragmatic hernia without mention of obstruction or gangrene   . Dyslipidemia   . Fibromyalgia   . GERD (gastroesophageal reflux disease)    with h/o esophagitis  . Guillain-Barre syndrome ([place])   . History of benign thymus tumor   . Insomnia, unspecified   .  Lumbar spinal stenosis 03/30/2018   L4-5 level, severe  . Miscarriage 1962  . Mixed hyperlipidemia   . Morbid obesity ([place])   . Osteoporosis   . Pneumonia   . Polyneuropathy in diabetes(357.2)   . Rheumatoid arthritis(714.0)   . Spondylosis, lumbosacral   . Spontaneous ecchymoses   . Type II or unspecified type diabetes mellitus with peripheral circulatory disorders, uncontrolled(250.72)   . Unspecified chronic bronchitis ([place])   . Unspecified essential hypertension   . Unspecified hypothyroidism   . Unspecified pruritic disorder   . Unspecified urinary incontinence     Past Surgical History:  Procedure Laterality Date  . ABDOMINAL HYSTERECTOMY  1974  . abdominal tumor  2002  . APPENDECTOMY    . APPLICATION OF A-CELL OF BACK N/A 09/26/2018   Procedure: With  [place];  Surgeon: Wallace Going, DO;  Location: [place];  Service: Plastics;  Laterality: N/A;  . APPLICATION OF WOUND VAC N/A 09/26/2018   Procedure: Vac placement;  Surgeon: Wallace Going, DO;  Location: [place];  Service: Plastics;  Laterality: N/A;  . [place]  . BRONCHOSCOPY  2001  . CATARACT EXTRACTION, BILATERAL  09/2019, 10/2019  . CHOLECYSTECTOMY  1984  . INCISION AND DRAINAGE OF WOUND N/A 09/26/2018   Procedure: Debridement of spine wound;  Surgeon: Wallace Going, DO;  Location: [place];  Service: Plastics;  Laterality: N/A;  . KNEE SURGERY Bilateral 08/27/2010 (L) and 01/11/2011 (R)  . LUMBAR LAMINECTOMY/DECOMPRESSION MICRODISCECTOMY N/A 07/03/2018   Procedure: Lumbar Three to Lumbar Five Laminectomy;  Surgeon: Judith Part, MD;  Location: [place];  Service: Neurosurgery;  Laterality: N/A;  . LUMBAR WOUND DEBRIDEMENT N/A 08/20/2018   Procedure: POSTERIOR LUMBAR SPINAL WOUND DEBRIDEMENT AND REVISION;  Surgeon: Judith Part, MD;  Location: [place];  Service: Neurosurgery;  Laterality: N/A;  POSTERIOR LUMBAR SPINAL WOUND REVISION  . [place]  . OVARIAN CYST SURGERY  1968  . thymus tumor    . 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 TONSILLECTOMY      Allergies  Allergen Reactions  . Influenza Vaccines Other (See Comments)    "h/o Guillain Barre; dr's told me years ago never to take another flu shot as it could relapse Rosalee Kaufman; has to do with when vaccine being changed to H1N1 virus"  . Penicillins Hives, Itching, Swelling and Rash    Has patient had a PCN reaction causing immediate rash, facial/tongue/throat swelling, SOB or lightheadedness with hypotension:Unknown Has patient had a PCN reaction causing severe rash involving mucus membranes or skin necrosis: Unknown Has patient had a PCN reaction that required hospitalization:Unknown Has patient had a PCN reaction occurring within the last 10 years: Unknown If all of the above answers are "NO", then  may proceed with Cephalosporin use.   . Statins Other (See Comments)    Myopathy, transaminitis  . Sulfamethoxazole-Trimethoprim Itching    Outpatient Encounter Medications as of 06/15/2020  Medication Sig  . acetaminophen (TYLENOL) 325 MG tablet Take 650 mg by mouth every 6 (six) hours as needed (for pain.).  Marland Kitchen amitriptyline (ELAVIL) 25 MG tablet Take 1 tablet (25 mg total) by mouth at bedtime.  Marland Kitchen amLODipine (NORVASC) 10 MG tablet TAKE 1 TABLET(10 MG) BY MOUTH DAILY  . aspirin EC 81 MG tablet Take 81 mg by mouth 2 (two) times a week.  . Chlorphen-Phenyleph-ASA (ALKA-SELTZER PLUS COLD PO) Take by mouth as needed.  . cholecalciferol (VITAMIN D3) 25 MCG (1000 UT) tablet Take 1,000 Units  by mouth daily.  Marland Kitchen docusate sodium (COLACE) 100 MG capsule Take 100 mg by mouth daily as needed (constipation.).   Marland Kitchen folic acid (FOLVITE) 1 MG tablet TAKE 2 TABLETS BY MOUTH  DAILY  . furosemide (LASIX) 20 MG tablet TAKE 1 TABLET BY MOUTH TWICE DAILY AS NEEDED FOR 3 POUND WEIGHT GAIN IN ONE DAY OR 5 POUNDS IN ONE WEEK  . gabapentin (NEURONTIN) 600 MG tablet TAKE 1 TABLET(600 MG) BY MOUTH THREE TIMES DAILY  . HYDROcodone-acetaminophen (NORCO) 10-325 MG tablet Take 1 tablet by mouth every 6 (six) hours as needed.  . Insulin Lispro (HUMALOG KWIKPEN) 200 UNIT/ML SOPN Inject 20 Units into the skin 3 (three) times daily.  . Insulin Pen Needle (B-D UF III MINI PEN NEEDLES) 31G X 5 MM MISC USE AS DIRECTED  . levothyroxine (SYNTHROID) 137 MCG tablet TAKE 1 TABLET BY MOUTH EVERY DAY 30 MINUTES BEFORE BREAKFAST  . lisinopril (ZESTRIL) 20 MG tablet TAKE 1 TABLET(20 MG) BY MOUTH DAILY  . loperamide (IMODIUM A-D) 2 MG tablet Take 2 mg by mouth 4 (four) times daily as needed for diarrhea or loose stools.  . magnesium oxide (MAG-OX) 400 MG tablet Take 1 tablet (400 mg total) by mouth daily as needed (for leg cramps).  . Melatonin 10 MG TABS Take 10 mg by mouth at bedtime.  . metoprolol tartrate (LOPRESSOR) 50 MG tablet Take  one tablet by mouth twice daily.  . Multiple Vitamins-Minerals (MULTIVITAMIN ADULT PO) Take 1 tablet by mouth daily.  Marland Kitchen MYRBETRIQ 50 MG TB24 tablet TAKE 1 TABLET(50 MG) BY MOUTH DAILY  . nystatin (NYSTATIN) powder APPLY TOPICALLY THREE TIMES DAILY AS DIRECTED FOR 14 DAYS and prn yeast infection  . nystatin cream (MYCOSTATIN) Apply 1 application topically 2 (two) times daily. To skin folds  . omega-3 acid ethyl esters (LOVAZA) 1 g capsule TAKE ONE CAPSULE BY MOUTH EVERY DAY  . polyvinyl alcohol (LIQUIFILM TEARS) 1.4 % ophthalmic solution Place 1 drop into both eyes 4 (four) times daily as needed (for dry/irritated eyes).   . potassium chloride (MICRO-K) 10 MEQ CR capsule Take 2 capsules (20 mEq total) by mouth 2 (two) times daily as needed (when taking a dose of lasix.).  Marland Kitchen tiZANidine (ZANAFLEX) 2 MG tablet Take 2 mg by mouth at bedtime.  Nelva Nay SOLOSTAR 300 UNIT/ML Solostar Pen INJECT 100 UNITS INTO THE SKIN EVERY NIGHT AT BEDTIME  . triamterene-hydrochlorothiazide (MAXZIDE-25) 37.5-25 MG tablet TAKE 1 TABLET BY MOUTH DAILY  . umeclidinium-vilanterol (ANORO ELLIPTA) 62.5-25 MCG/INH AEPB Inhale 1 puff into the lungs daily.  Marland Kitchen venlafaxine XR (EFFEXOR-XR) 75 MG 24 hr capsule TAKE 1 CAPSULE BY MOUTH EVERY DAY WITH BREAKFAST  . VICTOZA 18 MG/3ML SOPN ADMINISTER 1.8 MG UNDER THE SKIN DAILY FOR BLOOD SUGAR  . XELJANZ 5 MG TABS TAKE 1 TABLET BY MOUTH  DAILY  . sodium chloride (TGT SALINE NASAL SPRAY) 0.65 % nasal spray Place 2 sprays into the nose as needed for up to 14 days for congestion.   No facility-administered encounter medications on file as of 06/15/2020.    Review of Systems:  Review of Systems  Constitutional: Positive for malaise/fatigue. Negative for chills and fever.  HENT: Positive for congestion and hearing loss. Negative for sore throat.        Hoarse  Eyes: Negative for blurred vision.  Respiratory: Positive for cough and sputum production. Negative for shortness of breath and  wheezing.        Can't get good breath  Cardiovascular:  Positive for leg swelling. Negative for chest pain and palpitations.  Gastrointestinal: Negative for abdominal pain, blood in stool, constipation, diarrhea and melena.  Genitourinary: Positive for dysuria, frequency and urgency. Negative for flank pain and hematuria.       Negative ucx  Musculoskeletal: Negative for falls and joint pain.  Skin: Negative for itching and rash.       Toe ulceration, excoriation  Neurological: Positive for sensory change. Negative for dizziness and loss of consciousness.  Endo/Heme/Allergies: Bruises/bleeds easily.  Psychiatric/Behavioral: Negative for depression and memory loss. The patient is not nervous/anxious and does not have insomnia.     Health Maintenance  Topic Date Due  . FOOT EXAM  04/17/2020  . OPHTHALMOLOGY EXAM  10/02/2020  . HEMOGLOBIN A1C  12/10/2020  . DEXA SCAN  09/20/2025  . TETANUS/TDAP  05/28/2026  . COVID-19 Vaccine  Completed  . Hepatitis C Screening  Completed  . PNA vac Low Risk Adult  Completed  . INFLUENZA VACCINE  Discontinued    Physical Exam: Vitals:   06/15/20 0845  BP: 138/62  Pulse: 79  Temp: 98.4 F (36.9 C)  TempSrc: Temporal  SpO2: 93%  Weight: 238 lb 12.8 oz (108.3 kg)  Height: 5\' 6"  (1.676 m)   Body mass index is 38.54 kg/m. Physical Exam Vitals reviewed.  Constitutional:      General: She is not in acute distress.    Appearance: Normal appearance. She is obese. She is not toxic-appearing.  HENT:     Head: Normocephalic and atraumatic.  Eyes:     Extraocular Movements: Extraocular movements intact.     Conjunctiva/sclera: Conjunctivae normal.     Pupils: Pupils are equal, round, and reactive to light.  Cardiovascular:     Rate and Rhythm: Normal rate and regular rhythm.     Pulses: Normal pulses.     Heart sounds: Normal heart sounds.  Pulmonary:     Effort: Pulmonary effort is normal.     Breath sounds: Rhonchi present. No wheezing or  rales.  Abdominal:     General: Bowel sounds are normal.     Palpations: Abdomen is soft.     Tenderness: There is no abdominal tenderness. There is no guarding or rebound.  Musculoskeletal:        General: Normal range of motion.     Cervical back: Neck supple.     Right lower leg: No edema.     Left lower leg: No edema.  Skin:    General: Skin is warm and dry.     Comments:  Blister right 4th toe, small excoriation great toe right, calloused left great toe ulcer  Neurological:     General: No focal deficit present.     Mental Status: She is alert and oriented to person, place, and time.     Motor: Weakness present.     Gait: Gait abnormal.  Psychiatric:        Mood and Affect: Mood normal.        Behavior: Behavior normal.     Labs reviewed: Basic Metabolic Panel: Recent Labs    09/02/19 0841 01/24/20 1148 06/11/20 0757  NA 141 143 140  K 4.6 4.1 4.2  CL 103 104 100  CO2 31 29 30   GLUCOSE 122* 80 127*  BUN 24 16 21   CREATININE 0.97* 0.84 1.11*  CALCIUM 9.7 9.5 9.9  TSH  --   --  0.19*   Liver Function Tests: Recent Labs    08/02/19 1514 01/24/20  1148 06/11/20 0757  AST 10 15 18   ALT 7 9 13   BILITOT 0.3 0.3 0.4  PROT 6.7 6.7 7.1   No results for input(s): LIPASE, AMYLASE in the last 8760 hours. No results for input(s): AMMONIA in the last 8760 hours. CBC: Recent Labs    09/02/19 0841 01/24/20 1148 06/11/20 0757  WBC 14.8* 16.3* 11.8*  NEUTROABS 10,449* 11,117* 8,520*  HGB 13.9 13.9 14.4  HCT 42.6 42.2 44.8  MCV 83.0 84.6 84.8  PLT 373 368 333   Lipid Panel: Recent Labs    09/02/19 0841 06/11/20 0757  CHOL 150 167  HDL 39* 40*  LDLCALC 87 98  TRIG 139 197*  CHOLHDL 3.8 4.2   Lab Results  Component Value Date   HGBA1C 8.4 (H) 06/11/2020    Assessment/Plan 1. Acquired hypothyroidism -TSH low, adjust and recheck 6 wks - levothyroxine (SYNTHROID) 125 MCG tablet; TAKE 1 TABLET BY MOUTH EVERY DAY 30 MINUTES BEFORE BREAKFAST  Dispense: 90  tablet; Refill: 1 - TSH; Future - TSH; Future  2. Overactive bladder -doubt gemtesa will be any different -think her problem is nerve damage from her back, but she has not yet accepted this - urine cx inadequate for UTI -Vibegron (GEMTESA) 75 MG TABS; Take 1 tablet by mouth daily.  Dispense: 30 tablet; Refill: 0  3. Type 2 diabetes mellitus with diabetic polyneuropathy, with long-term current use of insulin (HCC) -control worse but still fairly good considering her number of different illnesses   - BASIC METABOLIC PANEL WITH GFR; Future - Hemoglobin A1c; Future  4. Spinal stenosis of lumbar region with neurogenic claudication -stable, managed now by pain mgt  5. Chronic bronchitis, unspecified chronic bronchitis type ([place]) -  Wants to go back to spiriva--feels like anoro is not working as well for her - tiotropium (SPIRIVA) 18 MCG inhalation capsule; Place 1 capsule (18 mcg total) into inhaler and inhale daily.  Dispense: 90 capsule; Refill: 3  6. Hyperlipidemia associated with type 2 diabetes mellitus ([place]) -  Could not take statins due to #7  - will add zetia instead in hopes we can get LDL to goal to prevent stroke - ezetimibe (ZETIA) 10 MG tablet; Take 1 tablet (10 mg total) by mouth daily.  Dispense: 90 tablet; Refill: 3 - Lipid panel; Future  7. Statin myopathy -in the past from statins along with transaminitis  8. Localized edema -renew lasix as supply very old  furosemide (LASIX) 20 MG tablet; TAKE 1 TABLET BY MOUTH TWICE DAILY AS NEEDED FOR 3 POUND WEIGHT GAIN IN ONE DAY OR 5 POUNDS IN ONE WEEK  Dispense: 60 tablet; Refill: 0  9. Hypokalemia due to excessive renal loss of potassium -renew old supply - potassium chloride (MICRO-K) 10 MEQ CR capsule; Take 2 capsules (20 mEq total) by mouth 2 (two) times daily as needed (when taking a dose of lasix.).  Dispense: 60 capsule; Refill: 0  Labs/tests ordered:   Lab Orders     TSH     BASIC METABOLIC PANEL WITH GFR      Hemoglobin A1c     TSH     Lipid panel  Next appt:  10/15/2020  Dulse Rutan L. Addalynn Kumari, D.O. [facility] [address], [place] [address] Cell Phone (Mon-Fri 8am-5pm):  [phone] On Call:  [phone] & follow prompts after 5pm & weekends Office Phone:  [phone] Office Fax:  [phone]

## 2020-06-15 NOTE — Progress Notes (Signed)
TB gold negative

## 2020-06-17 ENCOUNTER — Other Ambulatory Visit: Payer: Self-pay | Admitting: Internal Medicine

## 2020-06-17 ENCOUNTER — Other Ambulatory Visit: Payer: Self-pay | Admitting: *Deleted

## 2020-06-17 DIAGNOSIS — R600 Localized edema: Secondary | ICD-10-CM

## 2020-06-17 MED ORDER — FUROSEMIDE 20 MG PO TABS: ORAL_TABLET | ORAL | 0 refills | Status: DC

## 2020-06-17 NOTE — Telephone Encounter (Signed)
Patient called requesting Rx to be sent to [facility] Stated that it was sent to [facility] and they cannot get the medication, stated it was on back order.   Pended Rx and sent to Dr. Mariea Clonts for approval.

## 2020-06-18 ENCOUNTER — Ambulatory Visit (INDEPENDENT_AMBULATORY_CARE_PROVIDER_SITE_OTHER): Payer: Medicare Other | Admitting: Internal Medicine

## 2020-06-18 ENCOUNTER — Other Ambulatory Visit: Payer: Self-pay

## 2020-06-18 ENCOUNTER — Encounter: Payer: Self-pay | Admitting: Internal Medicine

## 2020-06-18 VITALS — BP 130/80 | HR 65 | Temp 97.5°F | Ht 66.0 in | Wt 236.6 lb

## 2020-06-18 DIAGNOSIS — N3020 Other chronic cystitis without hematuria: Secondary | ICD-10-CM

## 2020-06-18 DIAGNOSIS — N3281 Overactive bladder: Secondary | ICD-10-CM

## 2020-06-18 DIAGNOSIS — B372 Candidiasis of skin and nail: Secondary | ICD-10-CM

## 2020-06-18 MED ORDER — FLUCONAZOLE 150 MG PO TABS: 150.0000 mg | ORAL_TABLET | Freq: Once | ORAL | 0 refills | Status: AC

## 2020-06-18 MED ORDER — NYSTATIN 100000 UNIT/GM EX POWD: CUTANEOUS | 3 refills | Status: DC

## 2020-06-18 NOTE — Progress Notes (Deleted)
Careteam: Patient Care Team: Gayland Curry, DO as PCP - General (Geriatric Medicine) Bo Merino, MD (Rheumatology) Altheimer, Legrand Como, MD as Attending Physician (Endocrinology) Newt Minion, MD as Consulting Physician (Orthopedic Surgery)  PLACE OF SERVICE:  [facility]  Advanced Directive information    Allergies  Allergen Reactions  . Influenza Vaccines Other (See Comments)    "h/o Guillain Barre; dr's told me years ago never to take another flu shot as it could relapse Rosalee Kaufman; has to do with when vaccine being changed to H1N1 virus"  . Penicillins Hives, Itching, Swelling and Rash    Has patient had a PCN reaction causing immediate rash, facial/tongue/throat swelling, SOB or lightheadedness with hypotension:Unknown Has patient had a PCN reaction causing severe rash involving mucus membranes or skin necrosis: Unknown Has patient had a PCN reaction that required hospitalization:Unknown Has patient had a PCN reaction occurring within the last 10 years: Unknown If all of the above answers are "NO", then may proceed with Cephalosporin use.   . Statins Other (See Comments)    Myopathy, transaminitis  . Sulfamethoxazole-Trimethoprim Itching    No chief complaint on file.    HPI: Patient is a 79 y.o. female seen today for an acute visit for urinary burning and retention.   She continues to experience burning with urination and urinary frequency. She was recently seen by Dr. Mariea Clonts 06/15/20. She claims the burning has increased since last visit. She denies any fevers, flank pain, or hematuria.   She started [place] two days ago and claims it has helped with urinary frequency.   She also complains of yeast under her breast folds and groin. She has been using nystatin cream TID with no relief. She has also tried putting cotton fabric under the fold to absorb moisture.   Review of Systems:  Review of Systems  Constitutional: Negative for fever.  Genitourinary:  Positive for dysuria, frequency and urgency. Negative for flank pain and hematuria.  Skin: Positive for itching.       Yeast under breast and groin folds  ***  Past Medical History:  Diagnosis Date  . Abnormality of gait 04/19/2016  . Acute infective polyneuritis ([place]) 2002  . Allergic rhinitis due to pollen   . Chronic pain syndrome   . COPD (chronic obstructive pulmonary disease) (HCC)    chronic bronchitis  . Depressive disorder, not elsewhere classified   . Diabetes mellitus without complication ([place])   . Diaphragmatic hernia without mention of obstruction or gangrene   . Dyslipidemia   . Fibromyalgia   . GERD (gastroesophageal reflux disease)    with h/o esophagitis  . Guillain-Barre syndrome ([place])   . History of benign thymus tumor   . Insomnia, unspecified   . Lumbar spinal stenosis 03/30/2018   L4-5 level, severe  . Miscarriage 1962  . Mixed hyperlipidemia   . Morbid obesity ([place])   . Osteoporosis   . Pneumonia   . Polyneuropathy in diabetes(357.2)   . Rheumatoid arthritis(714.0)   . Spondylosis, lumbosacral   . Spontaneous ecchymoses   . Type II or unspecified type diabetes mellitus with peripheral circulatory disorders, uncontrolled(250.72)   . Unspecified chronic bronchitis ([place])   . Unspecified essential hypertension   . Unspecified hypothyroidism   . Unspecified pruritic disorder   . Unspecified urinary incontinence    Past Surgical History:  Procedure Laterality Date  . ABDOMINAL HYSTERECTOMY  1974  . abdominal tumor  2002  . APPENDECTOMY    . APPLICATION OF A-CELL  OF BACK N/A 09/26/2018   Procedure: With Acell;  Surgeon: Wallace Going, DO;  Location: [facility];  Service: Plastics;  Laterality: N/A;  . APPLICATION OF WOUND VAC N/A 09/26/2018   Procedure: Vac placement;  Surgeon: Wallace Going, DO;  Location: [facility];  Service: Plastics;  Laterality: N/A;  . [place]  . BRONCHOSCOPY  2001  . CATARACT EXTRACTION, BILATERAL  09/2019, 10/2019  .  CHOLECYSTECTOMY  1984  . INCISION AND DRAINAGE OF WOUND N/A 09/26/2018   Procedure: Debridement of spine wound;  Surgeon: Wallace Going, DO;  Location: [facility];  Service: Plastics;  Laterality: N/A;  . KNEE SURGERY Bilateral 08/27/2010 (L) and 01/11/2011 (R)  . LUMBAR LAMINECTOMY/DECOMPRESSION MICRODISCECTOMY N/A 07/03/2018   Procedure: Lumbar Three to Lumbar Five Laminectomy;  Surgeon: Judith Part, MD;  Location: [facility];  Service: Neurosurgery;  Laterality: N/A;  . LUMBAR WOUND DEBRIDEMENT N/A 08/20/2018   Procedure: POSTERIOR LUMBAR SPINAL WOUND DEBRIDEMENT AND REVISION;  Surgeon: Judith Part, MD;  Location: [facility];  Service: Neurosurgery;  Laterality: N/A;  POSTERIOR LUMBAR SPINAL WOUND REVISION  . [place]  . OVARIAN CYST SURGERY  1968  . thymus tumor    . 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 TONSILLECTOMY     Social History:   reports that she quit smoking about 40 years ago. Her smoking use included cigarettes. She has a 1.00 pack-year smoking history. She has never used smokeless tobacco. She reports that she does not drink alcohol and does not use drugs.  Family History  Problem Relation Age of Onset  . Alzheimer's disease Mother   . Heart disease Mother   . Heart disease Father   . Liver disease Father   . Cancer Brother   . Arthritis Son   . Colon polyps Brother   . Colon cancer Neg Hx   . Esophageal cancer Neg Hx   . Kidney disease Neg Hx   . Stomach cancer Neg Hx   . Rectal cancer Neg Hx     Medications: Patient's Medications  New Prescriptions   No medications on file  Previous Medications   ACETAMINOPHEN (TYLENOL) 325 MG TABLET    Take 650 mg by mouth every 6 (six) hours as needed (for pain.).   AMITRIPTYLINE (ELAVIL) 25 MG TABLET    Take 1 tablet (25 mg total) by mouth at bedtime.   AMLODIPINE (NORVASC) 10 MG TABLET    TAKE 1 TABLET(10 MG) BY MOUTH DAILY   ASPIRIN EC 81 MG TABLET    Take 81 mg by mouth 2 (two) times a week.    CHLORPHEN-PHENYLEPH-ASA (ALKA-SELTZER PLUS COLD PO)    Take by mouth as needed.   CHOLECALCIFEROL (VITAMIN D3) 25 MCG (1000 UT) TABLET    Take 1,000 Units by mouth daily.   DOCUSATE SODIUM (COLACE) 100 MG CAPSULE    Take 100 mg by mouth daily as needed (constipation.).    EZETIMIBE (ZETIA) 10 MG TABLET    Take 1 tablet (10 mg total) by mouth daily.   FOLIC ACID (FOLVITE) 1 MG TABLET    TAKE 2 TABLETS BY MOUTH  DAILY   FUROSEMIDE (LASIX) 20 MG TABLET    TAKE 1 TABLET BY MOUTH TWICE DAILY AS NEEDED FOR 3 POUND WEIGHT GAIN IN ONE DAY OR 5 POUNDS IN ONE WEEK   GABAPENTIN (NEURONTIN) 600 MG TABLET    TAKE 1 TABLET(600 MG) BY MOUTH THREE TIMES DAILY   HYDROCODONE-ACETAMINOPHEN (NORCO) 10-325 MG  TABLET    Take 1 tablet by mouth every 6 (six) hours as needed.   INSULIN LISPRO (HUMALOG KWIKPEN) 200 UNIT/ML SOPN    Inject 20 Units into the skin 3 (three) times daily.   INSULIN PEN NEEDLE (B-D UF III MINI PEN NEEDLES) 31G X 5 MM MISC    USE AS DIRECTED   LEVOTHYROXINE (SYNTHROID) 125 MCG TABLET    TAKE 1 TABLET BY MOUTH EVERY DAY 30 MINUTES BEFORE BREAKFAST   LISINOPRIL (ZESTRIL) 20 MG TABLET    TAKE 1 TABLET(20 MG) BY MOUTH DAILY   LOPERAMIDE (IMODIUM A-D) 2 MG TABLET    Take 2 mg by mouth 4 (four) times daily as needed for diarrhea or loose stools.   MAGNESIUM OXIDE (MAG-OX) 400 MG TABLET    Take 1 tablet (400 mg total) by mouth daily as needed (for leg cramps).   MELATONIN 10 MG TABS    Take 10 mg by mouth at bedtime.   METOPROLOL TARTRATE (LOPRESSOR) 50 MG TABLET    Take one tablet by mouth twice daily.   MULTIPLE VITAMINS-MINERALS (MULTIVITAMIN ADULT PO)    Take 1 tablet by mouth daily.   NYSTATIN (NYSTATIN) POWDER    APPLY TOPICALLY THREE TIMES DAILY AS DIRECTED FOR 14 DAYS and prn yeast infection   NYSTATIN CREAM (MYCOSTATIN)    Apply 1 application topically 2 (two) times daily. To skin folds   OMEGA-3 ACID ETHYL ESTERS (LOVAZA) 1 G CAPSULE    TAKE ONE CAPSULE BY MOUTH EVERY DAY   POLYVINYL ALCOHOL  (LIQUIFILM TEARS) 1.4 % OPHTHALMIC SOLUTION    Place 1 drop into both eyes 4 (four) times daily as needed (for dry/irritated eyes).    POTASSIUM CHLORIDE (MICRO-K) 10 MEQ CR CAPSULE    Take 2 capsules (20 mEq total) by mouth 2 (two) times daily as needed (when taking a dose of lasix.).   SODIUM CHLORIDE (TGT SALINE NASAL SPRAY) 0.65 % NASAL SPRAY    Place 2 sprays into the nose as needed for up to 14 days for congestion.   TIOTROPIUM (SPIRIVA) 18 MCG INHALATION CAPSULE    Place 1 capsule (18 mcg total) into inhaler and inhale daily.   TIZANIDINE (ZANAFLEX) 2 MG TABLET    Take 2 mg by mouth at bedtime.   TOUJEO SOLOSTAR 300 UNIT/ML SOLOSTAR PEN    INJECT 100 UNITS INTO THE SKIN EVERY NIGHT AT BEDTIME   TRIAMTERENE-HYDROCHLOROTHIAZIDE (MAXZIDE-25) 37.5-25 MG TABLET    TAKE 1 TABLET BY MOUTH DAILY   VENLAFAXINE XR (EFFEXOR-XR) 75 MG 24 HR CAPSULE    TAKE 1 CAPSULE BY MOUTH EVERY DAY WITH BREAKFAST   VIBEGRON (GEMTESA) 75 MG TABS    Take 1 tablet by mouth daily.   VICTOZA 18 MG/3ML SOPN    ADMINISTER 1.8 MG UNDER THE SKIN DAILY FOR BLOOD SUGAR   XELJANZ 5 MG TABS    TAKE 1 TABLET BY MOUTH  DAILY  Modified Medications   No medications on file  Discontinued Medications   No medications on file    Physical Exam:  There were no vitals filed for this visit. There is no height or weight on file to calculate BMI. Wt Readings from Last 3 Encounters:  06/15/20 238 lb 12.8 oz (108.3 kg)  04/23/20 239 lb (108.4 kg)  03/25/20 242 lb 9.6 oz (110 kg)    Physical Exam***  Labs reviewed: Basic Metabolic Panel: Recent Labs    09/02/19 0841 01/24/20 1148 06/11/20 0757  NA 141 143 140  K 4.6 4.1 4.2  CL 103 104 100  CO2 31 29 30   GLUCOSE 122* 80 127*  BUN 24 16 21   CREATININE 0.97* 0.84 1.11*  CALCIUM 9.7 9.5 9.9  TSH  --   --  0.19*   Liver Function Tests: Recent Labs    08/02/19 1514 01/24/20 1148 06/11/20 0757  AST 10 15 18   ALT 7 9 13   BILITOT 0.3 0.3 0.4  PROT 6.7 6.7 7.1   No  results for input(s): LIPASE, AMYLASE in the last 8760 hours. No results for input(s): AMMONIA in the last 8760 hours. CBC: Recent Labs    09/02/19 0841 01/24/20 1148 06/11/20 0757  WBC 14.8* 16.3* 11.8*  NEUTROABS 10,449* 11,117* 8,520*  HGB 13.9 13.9 14.4  HCT 42.6 42.2 44.8  MCV 83.0 84.6 84.8  PLT 373 368 333   Lipid Panel: Recent Labs    09/02/19 0841 06/11/20 0757  CHOL 150 167  HDL 39* 40*  LDLCALC 87 98  TRIG 139 197*  CHOLHDL 3.8 4.2   TSH: Recent Labs    06/11/20 0757  TSH 0.19*   A1C: Lab Results  Component Value Date   HGBA1C 8.4 (H) 06/11/2020     Assessment/Plan 1. Candidal skin infection *** - nystatin (NYSTATIN) powder; APPLY TOPICALLY THREE TIMES DAILY AS DIRECTED FOR 14 DAYS and prn yeast infection  Dispense: 60 g; Refill: 3 - fluconazole (DIFLUCAN) 150 MG tablet; Take 1 tablet (150 mg total) by mouth once for 1 dose.  Dispense: 1 tablet; Refill: 0  2. Overactive bladder ***  3. Chronic cystitis *** There are no diagnoses linked to this encounter.  Next appt: *** Amy Bartley, [facility] [phone]

## 2020-06-18 NOTE — Progress Notes (Signed)
Location:  [facility] clinic Provider: Nahima Ales L. Mariea Clonts, D.O., C.M.D.  Goals of Care:  Advanced Directives 06/18/2020  Does Patient Have a Medical Advance Directive? Yes  Type of Advance Directive Out of facility DNR (pink MOST or yellow form)  Does patient want to make changes to medical advance directive? No - Guardian declined  Copy of [place] in Chart? -  Would patient like information on creating a medical advance directive? -  Pre-existing out of facility DNR order (yellow form or pink MOST form) -     Chief Complaint  Patient presents with  . Acute Visit    Hurting and burning urination. She feels like she has yeast under breast and in the creases of her groin.     HPI: Patient is a 79 y.o. female with uncontrolled DMII, recurrent infections, seen today for an acute visit for ongoing hurting and pain on urination and yeast in groin.  Per NP who checked on her initially today:  She continues to experience burning with urination and urinary frequency. She was recently seen by Dr. Mariea Clonts 06/15/20. She claims the burning has increased since last visit. She denies any fevers, flank pain, or hematuria.   She started [place] two days ago and claims it has helped with urinary frequency.   She also complains of yeast under her breast folds and groin. She has been using nystatin cream TID with no relief. She has also tried putting cotton fabric under the fold to absorb moisture.   Past Medical History:  Diagnosis Date  . Abnormality of gait 04/19/2016  . Acute infective polyneuritis ([employer]) 2002  . Allergic rhinitis due to pollen   . Chronic pain syndrome   . COPD (chronic obstructive pulmonary disease) (HCC)    chronic bronchitis  . Depressive disorder, not elsewhere classified   . Diabetes mellitus without complication ([place])   . Diaphragmatic hernia without mention of obstruction or gangrene   . Dyslipidemia   . Fibromyalgia   . GERD (gastroesophageal reflux  disease)    with h/o esophagitis  . Guillain-Barre syndrome ([place])   . History of benign thymus tumor   . Insomnia, unspecified   . Lumbar spinal stenosis 03/30/2018   L4-5 level, severe  . Miscarriage 1962  . Mixed hyperlipidemia   . Morbid obesity ([place])   . Osteoporosis   . Pneumonia   . Polyneuropathy in diabetes(357.2)   . Rheumatoid arthritis(714.0)   . Spondylosis, lumbosacral   . Spontaneous ecchymoses   . Type II or unspecified type diabetes mellitus with peripheral circulatory disorders, uncontrolled(250.72)   . Unspecified chronic bronchitis ([place])   . Unspecified essential hypertension   . Unspecified hypothyroidism   . Unspecified pruritic disorder   . Unspecified urinary incontinence     Past Surgical History:  Procedure Laterality Date  . ABDOMINAL HYSTERECTOMY  1974  . abdominal tumor  2002  . APPENDECTOMY    . APPLICATION OF A-CELL OF BACK N/A 09/26/2018   Procedure: With Acell;  Surgeon: Wallace Going, DO;  Location: [facility];  Service: Plastics;  Laterality: N/A;  . APPLICATION OF WOUND VAC N/A 09/26/2018   Procedure: Vac placement;  Surgeon: Wallace Going, DO;  Location: [facility];  Service: Plastics;  Laterality: N/A;  . [place]  . BRONCHOSCOPY  2001  . CATARACT EXTRACTION, BILATERAL  09/2019, 10/2019  . CHOLECYSTECTOMY  1984  . INCISION AND DRAINAGE OF WOUND N/A 09/26/2018   Procedure: Debridement of spine wound;  Surgeon: Wallace Going, DO;  Location: [facility];  Service: Plastics;  Laterality: N/A;  . KNEE SURGERY Bilateral 08/27/2010 (L) and 01/11/2011 (R)  . LUMBAR LAMINECTOMY/DECOMPRESSION MICRODISCECTOMY N/A 07/03/2018   Procedure: Lumbar Three to Lumbar Five Laminectomy;  Surgeon: Judith Part, MD;  Location: [facility];  Service: Neurosurgery;  Laterality: N/A;  . LUMBAR WOUND DEBRIDEMENT N/A 08/20/2018   Procedure: POSTERIOR LUMBAR SPINAL WOUND DEBRIDEMENT AND REVISION;  Surgeon: Judith Part, MD;  Location: [facility];  Service:  Neurosurgery;  Laterality: N/A;  POSTERIOR LUMBAR SPINAL WOUND REVISION  . [place]  . OVARIAN CYST SURGERY  1968  . thymus tumor    . 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 TONSILLECTOMY      Allergies  Allergen Reactions  . Influenza Vaccines Other (See Comments)    "h/o Guillain Barre; dr's told me years ago never to take another flu shot as it could relapse Rosalee Kaufman; has to do with when vaccine being changed to H1N1 virus"  . Penicillins Hives, Itching, Swelling and Rash    Has patient had a PCN reaction causing immediate rash, facial/tongue/throat swelling, SOB or lightheadedness with hypotension:Unknown Has patient had a PCN reaction causing severe rash involving mucus membranes or skin necrosis: Unknown Has patient had a PCN reaction that required hospitalization:Unknown Has patient had a PCN reaction occurring within the last 10 years: Unknown If all of the above answers are "NO", then may proceed with Cephalosporin use.   . Statins Other (See Comments)    Myopathy, transaminitis  . Sulfamethoxazole-Trimethoprim Itching    Outpatient Encounter Medications as of 06/18/2020  Medication Sig  . acetaminophen (TYLENOL) 325 MG tablet Take 650 mg by mouth every 6 (six) hours as needed (for pain.).  Marland Kitchen amitriptyline (ELAVIL) 25 MG tablet Take 1 tablet (25 mg total) by mouth at bedtime.  Marland Kitchen amLODipine (NORVASC) 10 MG tablet TAKE 1 TABLET(10 MG) BY MOUTH DAILY  . aspirin EC 81 MG tablet Take 81 mg by mouth 2 (two) times a week.  . Chlorphen-Phenyleph-ASA (ALKA-SELTZER PLUS COLD PO) Take by mouth as needed.  . cholecalciferol (VITAMIN D3) 25 MCG (1000 UT) tablet Take 1,000 Units by mouth daily.  Marland Kitchen docusate sodium (COLACE) 100 MG capsule Take 100 mg by mouth daily as needed (constipation.).   Marland Kitchen ezetimibe (ZETIA) 10 MG tablet Take 1 tablet (10 mg total) by mouth daily.  . folic acid (FOLVITE) 1 MG tablet TAKE 2 TABLETS BY MOUTH  DAILY  . furosemide (LASIX) 20 MG tablet TAKE 1 TABLET BY  MOUTH TWICE DAILY AS NEEDED FOR 3 POUND WEIGHT GAIN IN ONE DAY OR 5 POUNDS IN ONE WEEK  . gabapentin (NEURONTIN) 600 MG tablet TAKE 1 TABLET(600 MG) BY MOUTH THREE TIMES DAILY  . HYDROcodone-acetaminophen (NORCO) 10-325 MG tablet Take 1 tablet by mouth every 6 (six) hours as needed.  . Insulin Lispro (HUMALOG KWIKPEN) 200 UNIT/ML SOPN Inject 20 Units into the skin 3 (three) times daily.  . Insulin Pen Needle (B-D UF III MINI PEN NEEDLES) 31G X 5 MM MISC USE AS DIRECTED  . levothyroxine (SYNTHROID) 125 MCG tablet TAKE 1 TABLET BY MOUTH EVERY DAY 30 MINUTES BEFORE BREAKFAST  . lisinopril (ZESTRIL) 20 MG tablet TAKE 1 TABLET(20 MG) BY MOUTH DAILY  . loperamide (IMODIUM A-D) 2 MG tablet Take 2 mg by mouth 4 (four) times daily as needed for diarrhea or loose stools.  . magnesium oxide (MAG-OX) 400 MG tablet Take 1 tablet (400 mg total) by  mouth daily as needed (for leg cramps).  . Melatonin 10 MG TABS Take 10 mg by mouth at bedtime.  . metoprolol tartrate (LOPRESSOR) 50 MG tablet Take one tablet by mouth twice daily.  . Multiple Vitamins-Minerals (MULTIVITAMIN ADULT PO) Take 1 tablet by mouth daily.  Marland Kitchen nystatin (NYSTATIN) powder APPLY TOPICALLY THREE TIMES DAILY AS DIRECTED FOR 14 DAYS and prn yeast infection  . nystatin cream (MYCOSTATIN) Apply 1 application topically 2 (two) times daily. To skin folds  . omega-3 acid ethyl esters (LOVAZA) 1 g capsule TAKE ONE CAPSULE BY MOUTH EVERY DAY  . polyvinyl alcohol (LIQUIFILM TEARS) 1.4 % ophthalmic solution Place 1 drop into both eyes 4 (four) times daily as needed (for dry/irritated eyes).   . potassium chloride (MICRO-K) 10 MEQ CR capsule Take 2 capsules (20 mEq total) by mouth 2 (two) times daily as needed (when taking a dose of lasix.).  Marland Kitchen tiotropium (SPIRIVA) 18 MCG inhalation capsule Place 1 capsule (18 mcg total) into inhaler and inhale daily.  Marland Kitchen tiZANidine (ZANAFLEX) 2 MG tablet Take 2 mg by mouth at bedtime.  Nelva Nay SOLOSTAR 300 UNIT/ML Solostar  Pen INJECT 100 UNITS INTO THE SKIN EVERY NIGHT AT BEDTIME  . triamterene-hydrochlorothiazide (MAXZIDE-25) 37.5-25 MG tablet TAKE 1 TABLET BY MOUTH DAILY  . venlafaxine XR (EFFEXOR-XR) 75 MG 24 hr capsule TAKE 1 CAPSULE BY MOUTH EVERY DAY WITH BREAKFAST  . Vibegron (GEMTESA) 75 MG TABS Take 1 tablet by mouth daily.  Marland Kitchen VICTOZA 18 MG/3ML SOPN ADMINISTER 1.8 MG UNDER THE SKIN DAILY FOR BLOOD SUGAR  . XELJANZ 5 MG TABS TAKE 1 TABLET BY MOUTH  DAILY  . [DISCONTINUED] nystatin (NYSTATIN) powder APPLY TOPICALLY THREE TIMES DAILY AS DIRECTED FOR 14 DAYS and prn yeast infection  . fluconazole (DIFLUCAN) 150 MG tablet Take 1 tablet (150 mg total) by mouth once for 1 dose.  . sodium chloride (TGT SALINE NASAL SPRAY) 0.65 % nasal spray Place 2 sprays into the nose as needed for up to 14 days for congestion.   No facility-administered encounter medications on file as of 06/18/2020.    Review of Systems:  Review of Systems  Constitutional: Negative for chills and fever.  HENT: Negative for congestion and sore throat.   Eyes: Negative for blurred vision.  Respiratory: Negative for cough and shortness of breath.   Cardiovascular: Negative for chest pain, palpitations and leg swelling.  Gastrointestinal: Negative for abdominal pain, blood in stool, constipation, diarrhea and melena.  Genitourinary: Positive for dysuria and frequency. Negative for flank pain, hematuria and urgency.  Musculoskeletal: Negative for falls.  Skin: Positive for rash.  Neurological: Negative for dizziness and loss of consciousness.  Psychiatric/Behavioral: Negative for depression and memory loss. The patient is not nervous/anxious and does not have insomnia.     Health Maintenance  Topic Date Due  . FOOT EXAM  04/17/2020  . OPHTHALMOLOGY EXAM  10/02/2020  . HEMOGLOBIN A1C  12/10/2020  . DEXA SCAN  09/20/2025  . TETANUS/TDAP  05/28/2026  . COVID-19 Vaccine  Completed  . Hepatitis C Screening  Completed  . PNA vac Low Risk  Adult  Completed  . INFLUENZA VACCINE  Discontinued    Physical Exam: Vitals:   06/18/20 1117  BP: 130/80  Pulse: 65  Temp: (!) 97.5 F (36.4 C)  TempSrc: Temporal  SpO2: 94%  Weight: 236 lb 9.6 oz (107.3 kg)  Height: 5\' 6"  (1.676 m)   Body mass index is 38.19 kg/m. Physical Exam Vitals reviewed. Exam conducted with  a chaperone present.  Constitutional:      General: She is not in acute distress.    Appearance: Normal appearance. She is not toxic-appearing.  HENT:     Head: Normocephalic and atraumatic.  Cardiovascular:     Rate and Rhythm: Normal rate and regular rhythm.  Pulmonary:     Effort: Pulmonary effort is normal.     Breath sounds: Normal breath sounds.  Abdominal:     General: Bowel sounds are normal.     Tenderness: There is abdominal tenderness.  Genitourinary:    Exam position: Lithotomy position.     Labia:        Right: Rash and tenderness present. No lesion or injury.        Left: Rash and tenderness present. No lesion or injury.      Urethra: No urethral swelling.  Musculoskeletal:        General: Normal range of motion.     Right lower leg: No edema.     Left lower leg: No edema.  Skin:    Comments: Erythema beneath breasts, labia, groin  Neurological:     General: No focal deficit present.     Mental Status: She is alert and oriented to person, place, and time.  Psychiatric:        Mood and Affect: Mood normal.        Behavior: Behavior normal.     Labs reviewed: Basic Metabolic Panel: Recent Labs    09/02/19 0841 01/24/20 1148 06/11/20 0757  NA 141 143 140  K 4.6 4.1 4.2  CL 103 104 100  CO2 31 29 30   GLUCOSE 122* 80 127*  BUN 24 16 21   CREATININE 0.97* 0.84 1.11*  CALCIUM 9.7 9.5 9.9  TSH  --   --  0.19*   Liver Function Tests: Recent Labs    08/02/19 1514 01/24/20 1148 06/11/20 0757  AST 10 15 18   ALT 7 9 13   BILITOT 0.3 0.3 0.4  PROT 6.7 6.7 7.1   No results for input(s): LIPASE, AMYLASE in the last 8760  hours. No results for input(s): AMMONIA in the last 8760 hours. CBC: Recent Labs    09/02/19 0841 01/24/20 1148 06/11/20 0757  WBC 14.8* 16.3* 11.8*  NEUTROABS 10,449* 11,117* 8,520*  HGB 13.9 13.9 14.4  HCT 42.6 42.2 44.8  MCV 83.0 84.6 84.8  PLT 373 368 333   Lipid Panel: Recent Labs    09/02/19 0841 06/11/20 0757  CHOL 150 167  HDL 39* 40*  LDLCALC 87 98  TRIG 139 197*  CHOLHDL 3.8 4.2   Lab Results  Component Value Date   HGBA1C 8.4 (H) 06/11/2020    Procedures since last visit: No results found.  Assessment/Plan 1. Candidal skin infection - will tx with one time dose of diflucan -counseled to use quick-dri material under breasts after bathing and applying nystatin powder, avoid bra at home - nystatin (NYSTATIN) powder; APPLY TOPICALLY THREE TIMES DAILY AS DIRECTED FOR 14 DAYS and prn yeast infection  Dispense: 60 g; Refill: 3 - fluconazole (DIFLUCAN) 150 MG tablet; Take 1 tablet (150 mg total) by mouth once for 1 dose.  Dispense: 1 tablet; Refill: 0  2. Overactive bladder -cont gemtesa which she reports is helping urgency component--was sent to mail order so we'll see if covered  3. Chronic cystitis -seems she may have some interstitial cystitis, but much of this may be due to her yeast infection and uncontrolled diabetes, also so address those and  go from there  Labs/tests ordered:  No new Next appt:  07/28/2020  Melvern Ramone L. Warwick Nick, D.O. [facility] [address], [place] [address] Cell Phone (Mon-Fri 8am-5pm):  [phone] On Call:  [phone] & follow prompts after 5pm & weekends Office Phone:  [phone] Office Fax:  [phone]

## 2020-06-18 NOTE — Patient Instructions (Signed)
Use the nystatin powder three times a day under your breasts Try using a material like underarmour makes for under the breasts that dries quickly Continue to work on improving your blood sugars Take one dose of diflucan for the yeast infections also Drink plenty of water Continue cranberry  Continue gemtesa samples (Rx should be coming in the mail).

## 2020-06-19 ENCOUNTER — Encounter
Payer: Medicare Other | Attending: Physical Medicine and Rehabilitation | Admitting: Physical Medicine and Rehabilitation

## 2020-06-19 ENCOUNTER — Other Ambulatory Visit: Payer: Self-pay

## 2020-06-19 ENCOUNTER — Encounter: Payer: Self-pay | Admitting: Physical Medicine and Rehabilitation

## 2020-06-19 VITALS — BP 153/77 | HR 127 | Temp 98.2°F | Ht 66.0 in | Wt 237.2 lb

## 2020-06-19 DIAGNOSIS — M170 Bilateral primary osteoarthritis of knee: Secondary | ICD-10-CM | POA: Diagnosis not present

## 2020-06-19 DIAGNOSIS — M7918 Myalgia, other site: Secondary | ICD-10-CM | POA: Diagnosis not present

## 2020-06-19 DIAGNOSIS — G894 Chronic pain syndrome: Secondary | ICD-10-CM | POA: Diagnosis not present

## 2020-06-19 DIAGNOSIS — Z6838 Body mass index (BMI) 38.0-38.9, adult: Secondary | ICD-10-CM | POA: Diagnosis not present

## 2020-06-19 MED ORDER — LIDOCAINE 5 % EX PTCH: 1.0000 | MEDICATED_PATCH | CUTANEOUS | 0 refills | Status: DC

## 2020-06-19 MED ORDER — AMITRIPTYLINE HCL 25 MG PO TABS
25.0000 mg | ORAL_TABLET | Freq: Every day | ORAL | 0 refills | Status: DC
Start: 2020-06-19 — End: 2020-07-27

## 2020-06-19 MED ORDER — HYDROCODONE-ACETAMINOPHEN 10-325 MG PO TABS: 1.0000 | ORAL_TABLET | Freq: Four times a day (QID) | ORAL | 0 refills | Status: DC | PRN

## 2020-06-19 NOTE — Patient Instructions (Signed)
Prolotherapy

## 2020-06-19 NOTE — Progress Notes (Signed)
Subjective:    Patient ID: Katherine Delgado, female    DOB: 1941-07-27, 79 y.o.   MRN: [id number]  HPI  Katherine Delgado is a 79 year old woman who presents for follow-up of her diffuse pains.  Pain is constant. Medication helps but it does not go away. Pain can be in arms, leg, chest. It tends to be worst in back, hips, and legs. Discussed trigger point injections which she would like to try.   She has never tried blue emu oil. She has tried Lidocaine patches in the past.   She is almost never walking due to her pain. She is not doing much walking at home either. She can only stand 5 minutes before she has to sit down.    Prior history:  Her cold symptoms are getting better but she did not spread germs to our office members.   Her pain has worsened as a result of her cold, but she does feel that the medication is helping. She would like to maintain the same dose.  She has been eating less and drinking more water and as a result has lost weight and her CBGs are better controlled.  Constipation has been well controlled with Colace.   Her pain has been quite severe. She is feeling better. Her average pain is 8/10. She has been to do a little bit more around the house. She is able to fold clothes when her son brings them in. She has not been able to walk much more. She does walk within the house, to go to the bathroom. She is limited by the knee pain and the radiating back pain, as well as her decreased strength. She has never had an epidural injection into the back.   Discussed the results of her lumbar MRI with her: Abnormal MRI lumbar spine (without) demonstrating: 1. At L4-5: disc bulging, disc protrusion, facet and ligamentum flavum hypertrophy, resulting in severe spinal stenosis and severe biforaminal stenosis. Also degenerative endplate and marrow edema at L4-5 with STIR hyperintense signal. 2. At L3-4: disc bulging, facet hypertrophy with moderate-severe spinal stenosis and severe  biforaminal stenosis. 3. At L5-S1: loss of disc space height, facet hypertrophy, left hemi-laminectomy, with no spinal stenosis or foraminal narrowing. 4. Compared to MRI on 10/19/12, there has been significant worsening of degenerative spine disease, especially at L4-5 level.  She had surgery on November 12th, 2019. This did help her and she wen through rehab afterward. But now she feels worse than before the surgery.    Pain Inventory Average Pain 8 Pain Right Now 6 My pain is constant, sharp, burning, dull, stabbing, tingling and aching  In the last 24 hours, has pain interfered with the following? General activity 9 Relation with others 9 Enjoyment of life 10 What TIME of day is your pain at its worst? morning  and night Sleep (in general) Poor  Pain is worse with: walking, bending and standing Pain improves with: rest and medication Relief from Meds: 7  Family History  Problem Relation Age of Onset  . Alzheimer's disease Mother   . Heart disease Mother   . Heart disease Father   . Liver disease Father   . Cancer Brother   . Arthritis Son   . Colon polyps Brother   . Colon cancer Neg Hx   . Esophageal cancer Neg Hx   . Kidney disease Neg Hx   . Stomach cancer Neg Hx   . Rectal cancer Neg Hx  Social History   Socioeconomic History  . Marital status: Widowed    Spouse name: Not on file  . Number of children: 2  . Years of education: 47  . Highest education level: Not on file  Occupational History  . Occupation: Retired  Tobacco Use  . Smoking status: Former Smoker    Packs/day: 0.10    Years: 10.00    Pack years: 1.00    Types: Cigarettes    Quit date: 12/07/1979    Years since quitting: 40.5  . Smokeless tobacco: Never Used  Vaping Use  . Vaping Use: Never used  Substance and Sexual Activity  . Alcohol use: No    Alcohol/week: 0.0 standard drinks  . Drug use: No  . Sexual activity: Not Currently  Other Topics Concern  . Not on file  Social History  Narrative   Walks with cane   Right handed    Caffeine use: Coffee (2 cups every morning)   Tea: sometimes   Soda: none   Social Determinants of Radio broadcast assistant Strain:   . Difficulty of Paying Living Expenses: Not on file  Food Insecurity:   . Worried About Charity fundraiser in the Last Year: Not on file  . Ran Out of Food in the Last Year: Not on file  Transportation Needs:   . Lack of Transportation (Medical): Not on file  . Lack of Transportation (Non-Medical): Not on file  Physical Activity:   . Days of Exercise per Week: Not on file  . Minutes of Exercise per Session: Not on file  Stress:   . Feeling of Stress : Not on file  Social Connections:   . Frequency of Communication with Friends and Family: Not on file  . Frequency of Social Gatherings with Friends and Family: Not on file  . Attends Religious Services: Not on file  . Active Member of Clubs or Organizations: Not on file  . Attends Archivist Meetings: Not on file  . Marital Status: Not on file   Past Surgical History:  Procedure Laterality Date  . ABDOMINAL HYSTERECTOMY  1974  . abdominal tumor  2002  . APPENDECTOMY    . APPLICATION OF A-CELL OF BACK N/A 09/26/2018   Procedure: With Acell;  Surgeon: Wallace Going, DO;  Location: [facility];  Service: Plastics;  Laterality: N/A;  . APPLICATION OF WOUND VAC N/A 09/26/2018   Procedure: Vac placement;  Surgeon: Wallace Going, DO;  Location: [facility];  Service: Plastics;  Laterality: N/A;  . [place]  . BRONCHOSCOPY  2001  . CATARACT EXTRACTION, BILATERAL  09/2019, 10/2019  . CHOLECYSTECTOMY  1984  . INCISION AND DRAINAGE OF WOUND N/A 09/26/2018   Procedure: Debridement of spine wound;  Surgeon: Wallace Going, DO;  Location: [place];  Service: Plastics;  Laterality: N/A;  . KNEE SURGERY Bilateral 08/27/2010 (L) and 01/11/2011 (R)  . LUMBAR LAMINECTOMY/DECOMPRESSION MICRODISCECTOMY N/A 07/03/2018   Procedure: Lumbar Three to  Lumbar Five Laminectomy;  Surgeon: Judith Part, MD;  Location: [facility];  Service: Neurosurgery;  Laterality: N/A;  . LUMBAR WOUND DEBRIDEMENT N/A 08/20/2018   Procedure: POSTERIOR LUMBAR SPINAL WOUND DEBRIDEMENT AND REVISION;  Surgeon: Judith Part, MD;  Location: [facility];  Service: Neurosurgery;  Laterality: N/A;  POSTERIOR LUMBAR SPINAL WOUND REVISION  . [place]  . OVARIAN CYST SURGERY  1968  . thymus tumor    . 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 TONSILLECTOMY  Past Surgical History:  Procedure Laterality Date  . ABDOMINAL HYSTERECTOMY  1974  . abdominal tumor  2002  . APPENDECTOMY    . APPLICATION OF A-CELL OF BACK N/A 09/26/2018   Procedure: With Acell;  Surgeon: Wallace Going, DO;  Location: [facility];  Service: Plastics;  Laterality: N/A;  . APPLICATION OF WOUND VAC N/A 09/26/2018   Procedure: Vac placement;  Surgeon: Wallace Going, DO;  Location: [facility];  Service: Plastics;  Laterality: N/A;  . [employer]  . BRONCHOSCOPY  2001  . CATARACT EXTRACTION, BILATERAL  09/2019, 10/2019  . CHOLECYSTECTOMY  1984  . INCISION AND DRAINAGE OF WOUND N/A 09/26/2018   Procedure: Debridement of spine wound;  Surgeon: Wallace Going, DO;  Location: [facility];  Service: Plastics;  Laterality: N/A;  . KNEE SURGERY Bilateral 08/27/2010 (L) and 01/11/2011 (R)  . LUMBAR LAMINECTOMY/DECOMPRESSION MICRODISCECTOMY N/A 07/03/2018   Procedure: Lumbar Three to Lumbar Five Laminectomy;  Surgeon: Judith Part, MD;  Location: [facility];  Service: Neurosurgery;  Laterality: N/A;  . LUMBAR WOUND DEBRIDEMENT N/A 08/20/2018   Procedure: POSTERIOR LUMBAR SPINAL WOUND DEBRIDEMENT AND REVISION;  Surgeon: Judith Part, MD;  Location: [facility];  Service: Neurosurgery;  Laterality: N/A;  POSTERIOR LUMBAR SPINAL WOUND REVISION  . [place]  . OVARIAN CYST SURGERY  1968  . thymus tumor    . 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 TONSILLECTOMY     Past Medical History:  Diagnosis Date  .  Abnormality of gait 04/19/2016  . Acute infective polyneuritis ([place]) 2002  . Allergic rhinitis due to pollen   . Chronic pain syndrome   . COPD (chronic obstructive pulmonary disease) (HCC)    chronic bronchitis  . Depressive disorder, not elsewhere classified   . Diabetes mellitus without complication ([place])   . Diaphragmatic hernia without mention of obstruction or gangrene   . Dyslipidemia   . Fibromyalgia   . GERD (gastroesophageal reflux disease)    with h/o esophagitis  . Guillain-Barre syndrome ([place])   . History of benign thymus tumor   . Insomnia, unspecified   . Lumbar spinal stenosis 03/30/2018   L4-5 level, severe  . Miscarriage 1962  . Mixed hyperlipidemia   . Morbid obesity ([place])   . Osteoporosis   . Pneumonia   . Polyneuropathy in diabetes(357.2)   . Rheumatoid arthritis(714.0)   . Spondylosis, lumbosacral   . Spontaneous ecchymoses   . Type II or unspecified type diabetes mellitus with peripheral circulatory disorders, uncontrolled(250.72)   . Unspecified chronic bronchitis ([place])   . Unspecified essential hypertension   . Unspecified hypothyroidism   . Unspecified pruritic disorder   . Unspecified urinary incontinence    There were no vitals taken for this visit.  Opioid Risk Score:   Fall Risk Score:  `1  Depression screen PHQ 2/9  Depression screen [facility] 2/9 06/15/2020 03/12/2020 02/19/2020 02/10/2020 02/05/2020 01/14/2020 09/11/2019  Decreased Interest 0 0 0 0 3 0 0  Down, Depressed, Hopeless 0 0 0 0 0 0 0  PHQ - 2 Score 0 0 0 0 3 0 0  Altered sleeping - - 0 - 3 - -  Tired, decreased energy - - 0 - 2 - -  Change in appetite - - 0 - 2 - -  Feeling bad or failure about yourself  - - 0 - 0 - -  Trouble concentrating - - 0 - 1 - -  Moving slowly or fidgety/restless - - 0 -  0 - -  Suicidal thoughts - - 0 - 0 - -  PHQ-9 Score - - 0 - 11 - -  Difficult doing work/chores - - - - Extremely dIfficult - -  Some recent data might be hidden   Review of Systems    Constitutional: Negative.   HENT: Negative.   Eyes: Negative.   Respiratory: Negative.   Cardiovascular: Positive for leg swelling.  Gastrointestinal: Negative.   Endocrine: Negative.   Genitourinary: Negative.   Musculoskeletal: Positive for arthralgias, back pain and gait problem.  Skin: Negative.   Allergic/Immunologic: Negative.   Hematological: Negative.   Psychiatric/Behavioral: Negative.        Objective:   Physical Exam Gen: no distress, normal appearing HEENT: oral mucosa pink and moist, NCAT Cardio: Reg rate Chest: normal effort, normal rate of breathing Abd: soft, non-distended Ext: no edema Skin: intact Neuro: Alert and oriented x3.  Musculoskeletal: Brought on wheelchair. Right sided focal weakness in lower extremity. 0/5 strength in DF/PF Psych: pleasant, normal affect    Assessment & Plan:  Katherine Delgado is a 79 year old woman who presents with chronic pain syndrome, failed back syndrome, lumbar stenosis, and bilateral knee osteoarthritis, and severe insomnia.  1) Severe insomnia: Amitriptyline is helping. No side effects. Currently taking 25mg  last visit. Can maintain this dose. Does not need a refill. She is afraid that going higher would make her too groggy during the day.   2) Chronic pain syndrome secondary to lumbar spinal stenosis, failed back syndrome, and bilateral knee osteoarthritis that no longer benefits from injections. I have refilled Norco to 10mg -325mg  four times per day as needed for pain.  Pain contract previously signed and UDS obtained. Discussed role of exercise and anti-inflammatory diet in pain relief. Recommended exercise bike given limited mobility and low carb diet. Reviewed the results of MRI with her (as in HPI). She has made good dietary changes. The medication has been helping. I have prescribed Lidocaine patches to use daily, blue emu oil OTC. Will try trigger point injections for myofascial pain next visit.  3) Obesity class 2:  Monitor each visit. Can contribute to chronic disease and worsening pain. Lifestyle changes as above. Weight today is 239 lbs, this is down from 243 last visit. Commended on this.   4) Incontinence of bladder. Discussed timed voiding q2H. Cranberry supplements won't hurt, but help more for UTIs. Can continue Myrbetriq. Discussed that constipation could be a factor.   5) Congestive heart failure: educated that purpose of Lasix is to treat CHF and Myrbetric to treat urinary retention. Advised to follow-up with Dr. Mariea Clonts given her increased weight since last visit, which she attributes to fluid retention. She may benefit from a higher dose of Lasix.   6) Constipation:  Continue colace which is helping constipation.   7) Type 2 DM: Commended on progress! Glucose has been stable- she has been eating less sugar and drinking more water.   8) Impaired mobility and ADLs.: Encouraged to get out of the wheelchair every hour to increase blood flow, maintain muscle strength, and to prevent pressure injury. Discussed home therapies.

## 2020-06-19 NOTE — Addendum Note (Signed)
Addended by: Izora Ribas on: 06/19/2020 03:42 PM   Modules accepted: Orders

## 2020-06-22 ENCOUNTER — Other Ambulatory Visit: Payer: Self-pay | Admitting: Internal Medicine

## 2020-06-22 NOTE — Telephone Encounter (Signed)
Pharmacy requested refill °Pended Rx and sent to Dr. Reed for approval due to HIGH ALERT Warning.  °

## 2020-06-25 ENCOUNTER — Other Ambulatory Visit: Payer: Self-pay

## 2020-06-25 ENCOUNTER — Ambulatory Visit (INDEPENDENT_AMBULATORY_CARE_PROVIDER_SITE_OTHER): Payer: Medicare Other | Admitting: Rheumatology

## 2020-06-25 ENCOUNTER — Encounter: Payer: Self-pay | Admitting: Rheumatology

## 2020-06-25 VITALS — BP 132/74 | HR 72 | Resp 16 | Ht 66.0 in | Wt 237.0 lb

## 2020-06-25 DIAGNOSIS — M5136 Other intervertebral disc degeneration, lumbar region: Secondary | ICD-10-CM | POA: Diagnosis not present

## 2020-06-25 DIAGNOSIS — M3501 Sicca syndrome with keratoconjunctivitis: Secondary | ICD-10-CM

## 2020-06-25 DIAGNOSIS — Z8709 Personal history of other diseases of the respiratory system: Secondary | ICD-10-CM

## 2020-06-25 DIAGNOSIS — Z8719 Personal history of other diseases of the digestive system: Secondary | ICD-10-CM | POA: Diagnosis not present

## 2020-06-25 DIAGNOSIS — Z79899 Other long term (current) drug therapy: Secondary | ICD-10-CM

## 2020-06-25 DIAGNOSIS — Z8679 Personal history of other diseases of the circulatory system: Secondary | ICD-10-CM

## 2020-06-25 DIAGNOSIS — Z8639 Personal history of other endocrine, nutritional and metabolic disease: Secondary | ICD-10-CM | POA: Diagnosis not present

## 2020-06-25 DIAGNOSIS — G4709 Other insomnia: Secondary | ICD-10-CM | POA: Diagnosis not present

## 2020-06-25 DIAGNOSIS — M810 Age-related osteoporosis without current pathological fracture: Secondary | ICD-10-CM

## 2020-06-25 DIAGNOSIS — Z7189 Other specified counseling: Secondary | ICD-10-CM

## 2020-06-25 DIAGNOSIS — Z8659 Personal history of other mental and behavioral disorders: Secondary | ICD-10-CM | POA: Diagnosis not present

## 2020-06-25 DIAGNOSIS — M170 Bilateral primary osteoarthritis of knee: Secondary | ICD-10-CM | POA: Diagnosis not present

## 2020-06-25 DIAGNOSIS — Z8669 Personal history of other diseases of the nervous system and sense organs: Secondary | ICD-10-CM | POA: Diagnosis not present

## 2020-06-25 DIAGNOSIS — M51369 Other intervertebral disc degeneration, lumbar region without mention of lumbar back pain or lower extremity pain: Secondary | ICD-10-CM

## 2020-06-25 DIAGNOSIS — M0579 Rheumatoid arthritis with rheumatoid factor of multiple sites without organ or systems involvement: Secondary | ICD-10-CM

## 2020-06-25 DIAGNOSIS — M797 Fibromyalgia: Secondary | ICD-10-CM

## 2020-06-25 MED ORDER — XELJANZ 5 MG PO TABS
1.0000 | ORAL_TABLET | Freq: Every day | ORAL | 0 refills | Status: DC
Start: 2020-06-25 — End: 2020-08-06

## 2020-06-25 NOTE — Patient Instructions (Addendum)
Standing Labs We placed an order today for your standing lab work.   Please have your standing labs drawn in January and every 3 months  If possible, please have your labs drawn 2 weeks prior to your appointment so that the provider can discuss your results at your appointment.  We have open lab daily Monday through Thursday from 8:30-12:30 PM and 1:30-4:30 PM and Friday from 8:30-12:30 PM and 1:30-4:00 PM at the office of Dr. Kamoni Gentles,  Rheumatology.   Please be advised, patients with office appointments requiring lab work will take precedents over walk-in lab work.  If possible, please come for your lab work on Monday and Friday afternoons, as you may experience shorter wait times. The office is located at [address], [address], [place], [place] [address] No appointment is necessary.   Labs are drawn by [facility]. Please bring your co-pay at the time of your lab draw.  You may receive a bill from [facility] for your lab work.  If you wish to have your labs drawn at another location, please call the office 24 hours in advance to send orders.  If you have any questions regarding directions or hours of operation,  please call [phone].   As a reminder, please drink plenty of water prior to coming for your lab work. Thanks!  COVID-19 vaccine recommendations:   COVID-19 vaccine is recommended for everyone (unless you are allergic to a vaccine component), even if you are on a medication that suppresses your immune system.   If you are on Methotrexate, Cellcept (mycophenolate), Rinvoq, Xeljanz, and Olumiant- hold the medication for 1 week after each vaccine. Hold Methotrexate for 2 weeks after the single dose COVID-19 vaccine.   Do not take Tylenol or any anti-inflammatory medications (NSAIDs) 24 hours prior to the COVID-19 vaccination.   There is no direct evidence about the efficacy of the COVID-19 vaccine in individuals who are on medications that suppress the immune  system.   Even if you are fully vaccinated, and you are on any medications that suppress your immune system, please continue to wear a mask, maintain at least six feet social distance and practice hand hygiene.   If you develop a COVID-19 infection, please contact your PCP or our office to determine if you need monoclonal antibody infusion.  The booster vaccine is now available for immunocompromised patients.   Please see the following web sites for updated information.   [email or web address]     

## 2020-07-02 IMAGING — RF DG C-ARM 61-120 MIN
1 series · 1 of 1 positions shown · non-contrast
Comparison: None.

CLINICAL DATA: Intraoperative localization for lumbar decompression

EXAM:
LUMBAR SPINE - 1 VIEW; DG C-ARM 61-120 MIN

[Series 1: run · 1 of 1 slices shown]
[im 1/1]
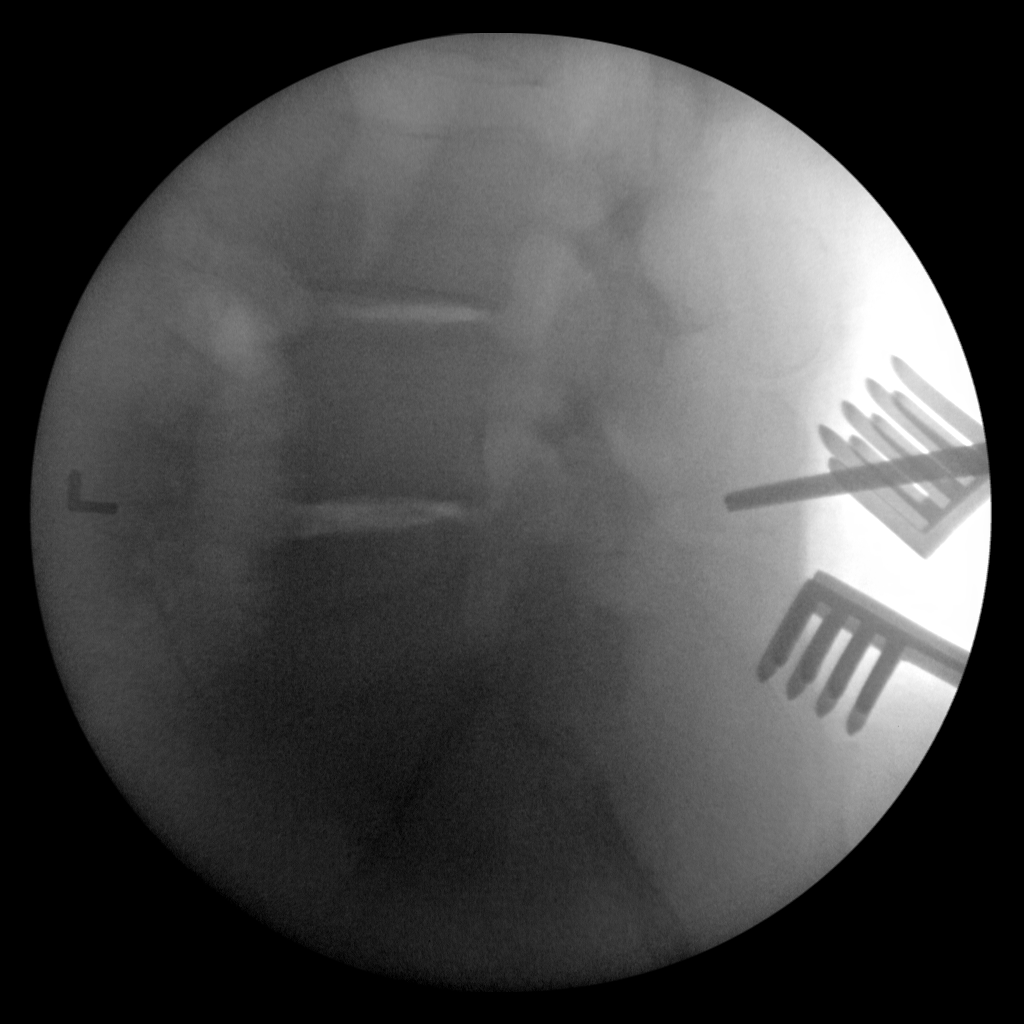

[1 of 1 positions shown; findings below may reference images not displayed]

FLUOROSCOPY TIME:  Fluoroscopy Time:  8 seconds

Radiation Exposure Index (if provided by the fluoroscopic device):
Not available

Number of Acquired Spot Images: 1
FINDINGS: Lateral radiograph is obtained intraoperatively with surgical
instruments and retractors at what appears to be L4-5.
IMPRESSION: Intraoperative localization for lumbar decompression

## 2020-07-13 ENCOUNTER — Other Ambulatory Visit: Payer: Self-pay | Admitting: Internal Medicine

## 2020-07-13 NOTE — Telephone Encounter (Signed)
rx sent to pharmacy by e-script  

## 2020-07-23 ENCOUNTER — Encounter
Payer: Medicare Other | Attending: Physical Medicine and Rehabilitation | Admitting: Physical Medicine and Rehabilitation

## 2020-07-23 ENCOUNTER — Encounter: Payer: Self-pay | Admitting: Physical Medicine and Rehabilitation

## 2020-07-23 ENCOUNTER — Other Ambulatory Visit: Payer: Self-pay

## 2020-07-23 VITALS — BP 129/69 | HR 70 | Temp 98.2°F | Ht 66.0 in | Wt 232.0 lb

## 2020-07-23 DIAGNOSIS — M961 Postlaminectomy syndrome, not elsewhere classified: Secondary | ICD-10-CM | POA: Diagnosis not present

## 2020-07-23 DIAGNOSIS — M1711 Unilateral primary osteoarthritis, right knee: Secondary | ICD-10-CM | POA: Diagnosis not present

## 2020-07-23 MED ORDER — OXYCODONE HCL 5 MG PO TABS: 10.0000 mg | ORAL_TABLET | Freq: Three times a day (TID) | ORAL | 0 refills | Status: DC | PRN

## 2020-07-23 NOTE — Progress Notes (Signed)
Subjective:    Patient ID: Katherine Delgado, female    DOB: June 17, 1941, 79 y.o.   MRN: [id number]  HPI  Katherine Delgado is a 79 year old woman who presents for follow-up of her diffuse pains.  She has been taking three Norco per day and finds this helps but wanted to see if she can take anything stronger. She has pain in her back as well as throughout her joints due to her autoimmune conditions. She feels that physically she is getting worse in term of her conditioning. She does not take steroid currently. She has taken Prednisone in the past with good benefit. Her son is also present for the appointment. A side effect she has had with prednisone is elevated blood sugar- she is already on several medications for diabetes.   Prior history:  Pain is constant. Medication helps but it does not go away. Pain can be in arms, leg, chest. It tends to be worst in back, hips, and legs. Discussed trigger point injections which she would like to try.   She has never tried blue emu oil. She has tried Lidocaine patches in the past.   She is almost never walking due to her pain. She is not doing much walking at home either. She can only stand 5 minutes before she has to sit down.   Prior history:  Her cold symptoms are getting better but she did not spread germs to our office members.   Her pain has worsened as a result of her cold, but she does feel that the medication is helping. She would like to maintain the same dose.  She has been eating less and drinking more water and as a result has lost weight and her CBGs are better controlled.  Constipation has been well controlled with Colace.   Her pain has been quite severe. She is feeling better. Her average pain is 8/10. She has been to do a little bit more around the house. She is able to fold clothes when her son brings them in. She has not been able to walk much more. She does walk within the house, to go to the bathroom. She is limited by the knee pain and  the radiating back pain, as well as her decreased strength. She has never had an epidural injection into the back.   Discussed the results of her lumbar MRI with her: Abnormal MRI lumbar spine (without) demonstrating: 1. At L4-5: disc bulging, disc protrusion, facet and ligamentum flavum hypertrophy, resulting in severe spinal stenosis and severe biforaminal stenosis. Also degenerative endplate and marrow edema at L4-5 with STIR hyperintense signal. 2. At L3-4: disc bulging, facet hypertrophy with moderate-severe spinal stenosis and severe biforaminal stenosis. 3. At L5-S1: loss of disc space height, facet hypertrophy, left hemi-laminectomy, with no spinal stenosis or foraminal narrowing. 4. Compared to MRI on 10/19/12, there has been significant worsening of degenerative spine disease, especially at L4-5 level.  She had surgery on November 12th, 2019. This did help her and she wen through rehab afterward. But now she feels worse than before the surgery.    Pain Inventory Average Pain 8 Pain Right Now 9 My pain is constant, sharp, burning, stabbing, tingling and aching  In the last 24 hours, has pain interfered with the following? General activity 10 Relation with others 10 Enjoyment of life 10 What TIME of day is your pain at its worst? morning  and night Sleep (in general) Poor  Pain is worse with: walking,  bending, sitting, standing and some activites Pain improves with: rest and medication Relief from Meds: 5  Family History  Problem Relation Age of Onset  . Alzheimer's disease Mother   . Heart disease Mother   . Heart disease Father   . Liver disease Father   . Cancer Brother   . Arthritis Son   . Colon polyps Brother   . Colon cancer Neg Hx   . Esophageal cancer Neg Hx   . Kidney disease Neg Hx   . Stomach cancer Neg Hx   . Rectal cancer Neg Hx    Social History   Socioeconomic History  . Marital status: Widowed    Spouse name: Not on file  . Number of children:  2  . Years of education: 57  . Highest education level: Not on file  Occupational History  . Occupation: Retired  Tobacco Use  . Smoking status: Former Smoker    Packs/day: 0.10    Years: 10.00    Pack years: 1.00    Types: Cigarettes    Quit date: 12/07/1979    Years since quitting: 40.6  . Smokeless tobacco: Never Used  Vaping Use  . Vaping Use: Never used  Substance and Sexual Activity  . Alcohol use: No    Alcohol/week: 0.0 standard drinks  . Drug use: No  . Sexual activity: Not Currently  Other Topics Concern  . Not on file  Social History Narrative   Walks with cane   Right handed    Caffeine use: Coffee (2 cups every morning)   Tea: sometimes   Soda: none   Social Determinants of Radio broadcast assistant Strain:   . Difficulty of Paying Living Expenses: Not on file  Food Insecurity:   . Worried About Charity fundraiser in the Last Year: Not on file  . Ran Out of Food in the Last Year: Not on file  Transportation Needs:   . Lack of Transportation (Medical): Not on file  . Lack of Transportation (Non-Medical): Not on file  Physical Activity:   . Days of Exercise per Week: Not on file  . Minutes of Exercise per Session: Not on file  Stress:   . Feeling of Stress : Not on file  Social Connections:   . Frequency of Communication with Friends and Family: Not on file  . Frequency of Social Gatherings with Friends and Family: Not on file  . Attends Religious Services: Not on file  . Active Member of Clubs or Organizations: Not on file  . Attends Archivist Meetings: Not on file  . Marital Status: Not on file   Past Surgical History:  Procedure Laterality Date  . ABDOMINAL HYSTERECTOMY  1974  . abdominal tumor  2002  . APPENDECTOMY    . APPLICATION OF A-CELL OF BACK N/A 09/26/2018   Procedure: With Acell;  Surgeon: Wallace Going, DO;  Location: [facility];  Service: Plastics;  Laterality: N/A;  . APPLICATION OF WOUND VAC N/A 09/26/2018    Procedure: Vac placement;  Surgeon: Wallace Going, DO;  Location: [facility];  Service: Plastics;  Laterality: N/A;  . [place]  . BRONCHOSCOPY  2001  . CATARACT EXTRACTION, BILATERAL  09/2019, 10/2019  . CHOLECYSTECTOMY  1984  . INCISION AND DRAINAGE OF WOUND N/A 09/26/2018   Procedure: Debridement of spine wound;  Surgeon: Wallace Going, DO;  Location: [facility];  Service: Plastics;  Laterality: N/A;  . KNEE SURGERY Bilateral 08/27/2010 (  L) and 01/11/2011 (R)  . LUMBAR LAMINECTOMY/DECOMPRESSION MICRODISCECTOMY N/A 07/03/2018   Procedure: Lumbar Three to Lumbar Five Laminectomy;  Surgeon: Judith Part, MD;  Location: [facility];  Service: Neurosurgery;  Laterality: N/A;  . LUMBAR WOUND DEBRIDEMENT N/A 08/20/2018   Procedure: POSTERIOR LUMBAR SPINAL WOUND DEBRIDEMENT AND REVISION;  Surgeon: Judith Part, MD;  Location: [facility];  Service: Neurosurgery;  Laterality: N/A;  POSTERIOR LUMBAR SPINAL WOUND REVISION  . [place]  . OVARIAN CYST SURGERY  1968  . thymus tumor    . 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 TONSILLECTOMY     Past Surgical History:  Procedure Laterality Date  . ABDOMINAL HYSTERECTOMY  1974  . abdominal tumor  2002  . APPENDECTOMY    . APPLICATION OF A-CELL OF BACK N/A 09/26/2018   Procedure: With Acell;  Surgeon: Wallace Going, DO;  Location: [facility];  Service: Plastics;  Laterality: N/A;  . APPLICATION OF WOUND VAC N/A 09/26/2018   Procedure: Vac placement;  Surgeon: Wallace Going, DO;  Location: [facility];  Service: Plastics;  Laterality: N/A;  . [place]  . BRONCHOSCOPY  2001  . CATARACT EXTRACTION, BILATERAL  09/2019, 10/2019  . CHOLECYSTECTOMY  1984  . INCISION AND DRAINAGE OF WOUND N/A 09/26/2018   Procedure: Debridement of spine wound;  Surgeon: Wallace Going, DO;  Location: [facility];  Service: Plastics;  Laterality: N/A;  . KNEE SURGERY Bilateral 08/27/2010 (L) and 01/11/2011 (R)  . LUMBAR LAMINECTOMY/DECOMPRESSION MICRODISCECTOMY  N/A 07/03/2018   Procedure: Lumbar Three to Lumbar Five Laminectomy;  Surgeon: Judith Part, MD;  Location: [facility];  Service: Neurosurgery;  Laterality: N/A;  . LUMBAR WOUND DEBRIDEMENT N/A 08/20/2018   Procedure: POSTERIOR LUMBAR SPINAL WOUND DEBRIDEMENT AND REVISION;  Surgeon: Judith Part, MD;  Location: [facility];  Service: Neurosurgery;  Laterality: N/A;  POSTERIOR LUMBAR SPINAL WOUND REVISION  . [place]  . OVARIAN CYST SURGERY  1968  . thymus tumor    . 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 TONSILLECTOMY     Past Medical History:  Diagnosis Date  . Abnormality of gait 04/19/2016  . Acute infective polyneuritis ([place]) 2002  . Allergic rhinitis due to pollen   . Chronic pain syndrome   . COPD (chronic obstructive pulmonary disease) (HCC)    chronic bronchitis  . Depressive disorder, not elsewhere classified   . Diabetes mellitus without complication ([place])   . Diaphragmatic hernia without mention of obstruction or gangrene   . Dyslipidemia   . Fibromyalgia   . GERD (gastroesophageal reflux disease)    with h/o esophagitis  . Guillain-Barre syndrome ([place])   . History of benign thymus tumor   . Insomnia, unspecified   . Lumbar spinal stenosis 03/30/2018   L4-5 level, severe  . Miscarriage 1962  . Mixed hyperlipidemia   . Morbid obesity ([place])   . Osteoporosis   . Pneumonia   . Polyneuropathy in diabetes(357.2)   . Rheumatoid arthritis(714.0)   . Spondylosis, lumbosacral   . Spontaneous ecchymoses   . Type II or unspecified type diabetes mellitus with peripheral circulatory disorders, uncontrolled(250.72)   . Unspecified chronic bronchitis ([place])   . Unspecified essential hypertension   . Unspecified hypothyroidism   . Unspecified pruritic disorder   . Unspecified urinary incontinence    BP 129/69   Pulse 70   Temp 98.2 F (36.8 C)   Ht 5\' 6"  (1.676 m)   Wt 232 lb (105.2 kg)  SpO2 92%   BMI 37.45 kg/m   Opioid Risk Score:   Fall Risk Score:  `1  Depression  screen PHQ 2/9  Depression screen [facility] [address] 2/9 07/23/2020 06/19/2020 06/15/2020 03/12/2020 02/19/2020 02/10/2020 02/05/2020  Decreased Interest 0 0 0 0 0 0 3  Down, Depressed, Hopeless 0 0 0 0 0 0 0  PHQ - 2 Score 0 0 0 0 0 0 3  Altered sleeping - - - - 0 - 3  Tired, decreased energy - - - - 0 - 2  Change in appetite - - - - 0 - 2  Feeling bad or failure about yourself  - - - - 0 - 0  Trouble concentrating - - - - 0 - 1  Moving slowly or fidgety/restless - - - - 0 - 0  Suicidal thoughts - - - - 0 - 0  PHQ-9 Score - - - - 0 - 11  Difficult doing work/chores - - - - - - Extremely dIfficult  Some recent data might be hidden   Review of Systems  Constitutional: Negative.   HENT: Negative.   Eyes: Negative.   Respiratory: Negative.   Cardiovascular: Positive for leg swelling.  Gastrointestinal: Negative.   Endocrine: Negative.   Genitourinary: Negative.   Musculoskeletal: Positive for arthralgias, back pain and gait problem.  Skin: Negative.   Allergic/Immunologic: Negative.   Hematological: Negative.   Psychiatric/Behavioral: Negative.        Objective:   Physical Exam Gen: no distress, normal appearing HEENT: oral mucosa pink and moist, NCAT Cardio: Reg rate Chest: normal effort, normal rate of breathing Abd: soft, non-distended Ext: no edema Skin: intact Neuro: Alert and oriented x3.  Musculoskeletal: Brought on wheelchair. Right sided focal weakness in lower extremity. 0/5 strength in DF/PF Psych: pleasant, normal affect    Assessment & Plan:  Katherine Delgado is a 79 year old woman who presents with chronic pain syndrome, failed back syndrome, lumbar stenosis, and bilateral knee osteoarthritis, and severe insomnia.  1) Severe insomnia: Amitriptyline is helping. No side effects. Currently taking 25mg  last visit. Can maintain this dose. Does not need a refill. She is afraid that going higher would make her too groggy during the day.  -The Amitriptyline helps her but only allows  her to sleep for a couple of hours. She does not want to increase the dose as she does not want to make it too drowsy.   2) Chronic pain syndrome secondary to lumbar spinal stenosis, failed back syndrome, and bilateral knee osteoarthritis that no longer benefits from injections.  Pain contract previously signed and UDS obtained. Discussed role of exercise and anti-inflammatory diet in pain relief. Recommended exercise bike given limited mobility and low carb diet. Reviewed the results of MRI with her (as in HPI). She has made good dietary changes. The medication has been helping. I have prescribed Lidocaine patches to use daily, blue emu oil OTC. Will try trigger point injections for myofascial pain next visit. -The Norco is helping but the oxycodone seemed to work better. Discussed whether she wants to switch back to oxycodone.  -Her pain is worst at night.   3) Obesity class 2: Monitor each visit. Can contribute to chronic disease and worsening pain. Lifestyle changes as above. Weight today is 239 lbs, this is down from 243 last visit. Commended on this.   4) Incontinence of bladder. Discussed timed voiding q2H. Cranberry supplements won't hurt, but help more for UTIs. Can continue Myrbetriq. Discussed that constipation could be a factor.  5) Congestive heart failure: educated that purpose of Lasix is to treat CHF and Myrbetric to treat urinary retention. Advised to follow-up with Dr. Mariea Clonts given her increased weight since last visit, which she attributes to fluid retention. She may benefit from a higher dose of Lasix.   6) Constipation:  Continue colace which is helping constipation.   7) Type 2 DM: Commended on progress! Glucose has been stable- she has been eating less sugar and drinking more water.   8) Impaired mobility and ADLs.: Encouraged to get out of the wheelchair every hour to increase blood flow, maintain muscle strength, and to prevent pressure injury. Discussed home therapies.   -Her mobility continues to be quite impaired. She would like to be able to get out of her wheelchair more frequently.   9) Autoimmune conditions: -Discussed that steroids can be very helpful for this. Discussed the side effects of steroids.

## 2020-07-25 ENCOUNTER — Other Ambulatory Visit: Payer: Self-pay | Admitting: Physical Medicine and Rehabilitation

## 2020-07-25 ENCOUNTER — Other Ambulatory Visit: Payer: Self-pay | Admitting: Internal Medicine

## 2020-07-25 DIAGNOSIS — R600 Localized edema: Secondary | ICD-10-CM

## 2020-07-27 NOTE — Telephone Encounter (Signed)
rx sent to pharmacy by e-script  

## 2020-07-28 ENCOUNTER — Other Ambulatory Visit: Payer: Medicare Other

## 2020-07-28 ENCOUNTER — Other Ambulatory Visit: Payer: Self-pay

## 2020-07-28 DIAGNOSIS — E039 Hypothyroidism, unspecified: Secondary | ICD-10-CM | POA: Diagnosis not present

## 2020-07-28 DIAGNOSIS — R7989 Other specified abnormal findings of blood chemistry: Secondary | ICD-10-CM | POA: Diagnosis not present

## 2020-07-29 NOTE — Progress Notes (Signed)
Please confirm that Katherine Delgado is taking 132mcg of levothyroxine.  Her TSH continues to drop though we have been decreasing her dose.  It's odd b/c her calculated dose should be 121mcg.  Also, let's add on free T4 to see where that is.

## 2020-07-30 LAB — TEST AUTHORIZATION

## 2020-07-30 LAB — T4, FREE: Free T4: 1.8 ng/dL (ref 0.8–1.8)

## 2020-07-30 LAB — TSH: TSH: 0.05 mIU/L — ABNORMAL LOW (ref 0.40–4.50)

## 2020-08-01 LAB — HM MAMMOGRAPHY

## 2020-08-03 ENCOUNTER — Encounter: Payer: Self-pay | Admitting: Internal Medicine

## 2020-08-04 ENCOUNTER — Encounter: Payer: Self-pay | Admitting: *Deleted

## 2020-08-06 ENCOUNTER — Other Ambulatory Visit: Payer: Self-pay | Admitting: Rheumatology

## 2020-08-06 NOTE — Telephone Encounter (Signed)
Last Visit: 06/25/2020 Next Visit: 10/22/2020 Labs: 06/11/2020 Creat. 1.11, GFR 47, WBC 11.8, RBC 5.28, Neutro Abs 8,520, Absolute Monocytes 1,050, eosinophils Absolute 555   Current Dose per office note on 06/25/2020: Morrie Sheldon 5 mg 1 tablet daily Dx: Rheumatoid arthritis involving multiple sites with positive rheumatoid factor  Okay to refill Morrie Sheldon?

## 2020-08-12 ENCOUNTER — Other Ambulatory Visit: Payer: Self-pay | Admitting: *Deleted

## 2020-08-12 DIAGNOSIS — E1151 Type 2 diabetes mellitus with diabetic peripheral angiopathy without gangrene: Secondary | ICD-10-CM

## 2020-08-12 MED ORDER — LISINOPRIL 20 MG PO TABS: ORAL_TABLET | ORAL | 1 refills | Status: DC

## 2020-08-12 NOTE — Telephone Encounter (Signed)
Received refill Request Pended Rx and sent to Dr. Mariea Clonts for approval due to [place].

## 2020-08-19 ENCOUNTER — Other Ambulatory Visit: Payer: Self-pay | Admitting: *Deleted

## 2020-08-19 DIAGNOSIS — B372 Candidiasis of skin and nail: Secondary | ICD-10-CM

## 2020-08-19 MED ORDER — NYSTATIN 100000 UNIT/GM EX POWD: CUTANEOUS | 3 refills | Status: DC

## 2020-08-19 NOTE — Telephone Encounter (Signed)
[facility] sent refill request Pended Rx and sent to Dr. Renato Gails for approval due to [facility] Warning.

## 2020-08-24 ENCOUNTER — Encounter
Payer: Medicare Other | Attending: Physical Medicine and Rehabilitation | Admitting: Physical Medicine and Rehabilitation

## 2020-08-24 ENCOUNTER — Encounter: Payer: Self-pay | Admitting: Physical Medicine and Rehabilitation

## 2020-08-24 ENCOUNTER — Telehealth (INDEPENDENT_AMBULATORY_CARE_PROVIDER_SITE_OTHER): Payer: Medicare Other | Admitting: Family

## 2020-08-24 ENCOUNTER — Other Ambulatory Visit: Payer: Self-pay | Admitting: Internal Medicine

## 2020-08-24 ENCOUNTER — Other Ambulatory Visit: Payer: Self-pay

## 2020-08-24 ENCOUNTER — Encounter: Payer: Self-pay | Admitting: Family

## 2020-08-24 VITALS — BP 151/62 | HR 71 | Ht 66.0 in | Wt 232.0 lb

## 2020-08-24 DIAGNOSIS — J019 Acute sinusitis, unspecified: Secondary | ICD-10-CM

## 2020-08-24 DIAGNOSIS — G894 Chronic pain syndrome: Secondary | ICD-10-CM | POA: Diagnosis not present

## 2020-08-24 DIAGNOSIS — M961 Postlaminectomy syndrome, not elsewhere classified: Secondary | ICD-10-CM | POA: Diagnosis not present

## 2020-08-24 DIAGNOSIS — Z6838 Body mass index (BMI) 38.0-38.9, adult: Secondary | ICD-10-CM | POA: Insufficient documentation

## 2020-08-24 DIAGNOSIS — M7918 Myalgia, other site: Secondary | ICD-10-CM | POA: Diagnosis not present

## 2020-08-24 DIAGNOSIS — M1711 Unilateral primary osteoarthritis, right knee: Secondary | ICD-10-CM | POA: Diagnosis not present

## 2020-08-24 MED ORDER — DOXYCYCLINE HYCLATE 100 MG PO TABS: 100.0000 mg | ORAL_TABLET | Freq: Two times a day (BID) | ORAL | 0 refills | Status: DC

## 2020-08-24 MED ORDER — AMITRIPTYLINE HCL 50 MG PO TABS: 50.0000 mg | ORAL_TABLET | Freq: Every day | ORAL | 1 refills | Status: DC

## 2020-08-24 MED ORDER — OXYCODONE HCL 5 MG PO TABS: 10.0000 mg | ORAL_TABLET | Freq: Three times a day (TID) | ORAL | 0 refills | Status: DC | PRN

## 2020-08-24 NOTE — Progress Notes (Addendum)
This service is provided via telemedicine  No vital signs collected/recorded due to the encounter was a telemedicine visit.   Location of patient (ex: home, work): Home.  Patient consents to a telephone visit: Yes.  Location of the provider (ex: office, home):  [facility].   Name of any referring provider: Gayland Curry, DO   Names of all persons participating in the telemedicine service and their role in the encounter: Patient, Katherine Delgado, [place], [place], Webb Silversmith, NP.    Time spent on call: 8 minutes spent on the phone with Medical Assistant.   Provider: Alta Shober FNP-C  Gayland Curry, DO  Patient Care Team: Gayland Curry, DO as PCP - General (Geriatric Medicine) Bo Merino, MD (Rheumatology) [place], Legrand Como, MD as Attending Physician (Endocrinology) Newt Minion, MD as Consulting Physician (Orthopedic Surgery)  Extended Emergency Contact Information Primary Emergency Contact: [facility] Address: [address]          [place], [place] [address] Johnnette Litter of [place] Phone: [phone] Relation: Son Secondary Emergency Contact: Katherine Delgado, [place] of [place] Mobile Phone: [phone] Relation: Son  Code Status:  Full Code  Goals of care: Advanced Directive information Advanced Directives 08/24/2020  Does Patient Have a Medical Advance Directive? Yes  Type of Advance Directive Out of facility DNR (pink MOST or yellow form)  Does patient want to make changes to medical advance directive? No - Patient declined  Copy of [place] in Chart? -  Would patient like information on creating a medical advance directive? -  Pre-existing out of facility DNR order (yellow form or pink MOST form) -     Chief Complaint  Patient presents with  . Acute Visit    Complains of sore throat, chest congestion, and head congestion.     HPI:  Pt is a 80 y.o. female seen today for  an acute visit for evaluation of cough, congestion in head,chest and sore throat x 3 days.Cough is described as non-productive.Also has runny nose but when it stops nose gets stuffed up.States just doesn't feel good.No appetite but has been drinking plenty fluids.Has been taking Tylenol and Mucinex but not helping.Temp was 99.0 at first.denies any loss of smell,taste,N/v ,diarrhea or contact with person with COVID-19.Has had her COVID-19 Pfizer vaccine x 2 but not the booster vaccine.   Past Medical History:  Diagnosis Date  . Abnormality of gait 04/19/2016  . Acute infective polyneuritis ([place]) 2002  . Allergic rhinitis due to pollen   . Chronic pain syndrome   . COPD (chronic obstructive pulmonary disease) (HCC)    chronic bronchitis  . Depressive disorder, not elsewhere classified   . Diabetes mellitus without complication ([place])   . Diaphragmatic hernia without mention of obstruction or gangrene   . Dyslipidemia   . Fibromyalgia   . GERD (gastroesophageal reflux disease)    with h/o esophagitis  . Guillain-Barre syndrome ([place])   . History of benign thymus tumor   . Insomnia, unspecified   . Lumbar spinal stenosis 03/30/2018   L4-5 level, severe  . Miscarriage 1962  . Mixed hyperlipidemia   . Morbid obesity ([place])   . Osteoporosis   . Pneumonia   . Polyneuropathy in diabetes(357.2)   . Rheumatoid arthritis(714.0)   . Spondylosis, lumbosacral   . Spontaneous ecchymoses   . Type II or unspecified type diabetes mellitus with peripheral circulatory disorders, uncontrolled(250.72)   . Unspecified  chronic bronchitis ([place])   . Unspecified essential hypertension   . Unspecified hypothyroidism   . Unspecified pruritic disorder   . Unspecified urinary incontinence    Past Surgical History:  Procedure Laterality Date  . ABDOMINAL HYSTERECTOMY  1974  . abdominal tumor  2002  . APPENDECTOMY    . APPLICATION OF A-CELL OF BACK N/A 09/26/2018   Procedure: With [place];  Surgeon: Wallace Going, DO;  Location: [facility];  Service: Plastics;  Laterality: N/A;  . APPLICATION OF WOUND VAC N/A 09/26/2018   Procedure: Vac placement;  Surgeon: Wallace Going, DO;  Location: [facility];  Service: Plastics;  Laterality: N/A;  . [place]  . BRONCHOSCOPY  2001  . CATARACT EXTRACTION, BILATERAL  09/2019, 10/2019  . CHOLECYSTECTOMY  1984  . INCISION AND DRAINAGE OF WOUND N/A 09/26/2018   Procedure: Debridement of spine wound;  Surgeon: Wallace Going, DO;  Location: [facility];  Service: Plastics;  Laterality: N/A;  . KNEE SURGERY Bilateral 08/27/2010 (L) and 01/11/2011 (R)  . LUMBAR LAMINECTOMY/DECOMPRESSION MICRODISCECTOMY N/A 07/03/2018   Procedure: Lumbar Three to Lumbar Five Laminectomy;  Surgeon: Judith Part, MD;  Location: [facility];  Service: Neurosurgery;  Laterality: N/A;  . LUMBAR WOUND DEBRIDEMENT N/A 08/20/2018   Procedure: POSTERIOR LUMBAR SPINAL WOUND DEBRIDEMENT AND REVISION;  Surgeon: Judith Part, MD;  Location: [facility];  Service: Neurosurgery;  Laterality: N/A;  POSTERIOR LUMBAR SPINAL WOUND REVISION  . [place]  . OVARIAN CYST SURGERY  1968  . thymus tumor    . 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 TONSILLECTOMY      Allergies  Allergen Reactions  . Influenza Vaccines Other (See Comments)    "h/o Guillain Barre; dr's told me years ago never to take another flu shot as it could relapse Katherine Delgado; has to do with when vaccine being changed to H1N1 virus"  . Penicillins Hives, Itching, Swelling and Rash    Has patient had a PCN reaction causing immediate rash, facial/tongue/throat swelling, SOB or lightheadedness with hypotension:Unknown Has patient had a PCN reaction causing severe rash involving mucus membranes or skin necrosis: Unknown Has patient had a PCN reaction that required hospitalization:Unknown Has patient had a PCN reaction occurring within the last 10 years: Unknown If all of the above answers are "NO", then may proceed with Cephalosporin  use.   . Statins Other (See Comments)    Myopathy, transaminitis  . Sulfamethoxazole-Trimethoprim Itching    Outpatient Encounter Medications as of 08/24/2020  Medication Sig  . acetaminophen (TYLENOL) 325 MG tablet Take 650 mg by mouth every 6 (six) hours as needed (for pain.).  Marland Kitchen amitriptyline (ELAVIL) 50 MG tablet Take 1 tablet (50 mg total) by mouth at bedtime.  Marland Kitchen amLODipine (NORVASC) 10 MG tablet TAKE 1 TABLET(10 MG) BY MOUTH DAILY  . aspirin EC 81 MG tablet Take 81 mg by mouth 2 (two) times a week.  . Chlorphen-Phenyleph-ASA (ALKA-SELTZER PLUS COLD PO) Take by mouth as needed.  . cholecalciferol (VITAMIN D3) 25 MCG (1000 UT) tablet Take 1,000 Units by mouth daily.  Marland Kitchen docusate sodium (COLACE) 100 MG capsule Take 100 mg by mouth daily as needed (constipation.).   Marland Kitchen ezetimibe (ZETIA) 10 MG tablet Take 1 tablet (10 mg total) by mouth daily.  . folic acid (FOLVITE) 1 MG tablet TAKE 2 TABLETS BY MOUTH  DAILY  . furosemide (LASIX) 20 MG tablet TAKE 1 TABLET BY MOUTH TWICE DAILY AS NEEDED FOR 3 POUND WEIGHT  GAIN IN ONE DAY OR 5 POUNDS IN ONE WEEK  . gabapentin (NEURONTIN) 600 MG tablet TAKE 1 TABLET(600 MG) BY MOUTH THREE TIMES DAILY  . Insulin Lispro (HUMALOG KWIKPEN) 200 UNIT/ML SOPN Inject 20 Units into the skin 3 (three) times daily.  . Insulin Pen Needle (B-D UF III MINI PEN NEEDLES) 31G X 5 MM MISC USE AS DIRECTED  . levothyroxine (SYNTHROID) 125 MCG tablet TAKE 1 TABLET BY MOUTH EVERY DAY 30 MINUTES BEFORE BREAKFAST  . lidocaine (LIDODERM) 5 % Place 1 patch onto the skin daily. Remove & Discard patch within 12 hours or as directed by MD  . lisinopril (ZESTRIL) 20 MG tablet Take one tablet by mouth once daily.  Marland Kitchen loperamide (IMODIUM A-D) 2 MG tablet Take 2 mg by mouth 4 (four) times daily as needed for diarrhea or loose stools.  . magnesium oxide (MAG-OX) 400 MG tablet Take 1 tablet (400 mg total) by mouth daily as needed (for leg cramps).  . Melatonin 10 MG TABS Take 10 mg by mouth at  bedtime.  . metoprolol tartrate (LOPRESSOR) 50 MG tablet Take one tablet by mouth twice daily.  . Multiple Vitamins-Minerals (MULTIVITAMIN ADULT PO) Take 1 tablet by mouth daily.  Marland Kitchen nystatin (NYSTATIN) powder APPLY TOPICALLY THREE TIMES DAILY AS DIRECTED FOR 14 DAYS and prn yeast infection  . nystatin cream (MYCOSTATIN) Apply 1 application topically 2 (two) times daily. To skin folds  . omega-3 acid ethyl esters (LOVAZA) 1 g capsule TAKE ONE CAPSULE BY MOUTH EVERY DAY  . oxyCODONE (ROXICODONE) 5 MG immediate release tablet Take 2 tablets (10 mg total) by mouth 3 (three) times daily as needed for severe pain.  . polyvinyl alcohol (LIQUIFILM TEARS) 1.4 % ophthalmic solution Place 1 drop into both eyes 4 (four) times daily as needed (for dry/irritated eyes).   . potassium chloride (MICRO-K) 10 MEQ CR capsule Take 2 capsules (20 mEq total) by mouth 2 (two) times daily as needed (when taking a dose of lasix.).  Marland Kitchen tiotropium (SPIRIVA) 18 MCG inhalation capsule Place 1 capsule (18 mcg total) into inhaler and inhale daily.  Marland Kitchen tiZANidine (ZANAFLEX) 2 MG tablet TAKE 1 TABLET(2 MG) BY MOUTH THREE TIMES DAILY  . TOUJEO SOLOSTAR 300 UNIT/ML Solostar Pen INJECT 100 UNITS INTO THE SKIN EVERY NIGHT AT BEDTIME  . triamterene-hydrochlorothiazide (MAXZIDE-25) 37.5-25 MG tablet TAKE 1 TABLET BY MOUTH  DAILY  . venlafaxine XR (EFFEXOR-XR) 75 MG 24 hr capsule TAKE 1 CAPSULE BY MOUTH EVERY DAY WITH BREAKFAST  . Vibegron (GEMTESA) 75 MG TABS Take 1 tablet by mouth daily.  Marland Kitchen VICTOZA 18 MG/3ML SOPN ADMINISTER 1.8 MG UNDER THE SKIN DAILY FOR BLOOD SUGAR  . XELJANZ 5 MG TABS TAKE 1 TABLET BY MOUTH  DAILY  . sodium chloride (TGT SALINE NASAL SPRAY) 0.65 % nasal spray Place 2 sprays into the nose as needed for up to 14 days for congestion.   No facility-administered encounter medications on file as of 08/24/2020.    Review of Systems  Constitutional: Negative for chills, fatigue and fever.  HENT: Positive for congestion and  rhinorrhea. Negative for postnasal drip, sinus pressure, sinus pain, sneezing, sore throat and trouble swallowing.   Eyes: Negative for discharge, redness and itching.  Respiratory: Positive for cough. Negative for chest tightness, shortness of breath and wheezing.   Cardiovascular: Negative for chest pain, palpitations and leg swelling.  Gastrointestinal: Negative for abdominal distention, abdominal pain, constipation, diarrhea, nausea and vomiting.  Neurological: Negative for dizziness, speech difficulty, light-headedness and  headaches.    Immunization History  Administered Date(s) Administered  . Influenza-Unspecified 06/10/2011  . PFIZER SARS-COV-2 Vaccination 11/13/2019, 11/30/2019  . Pneumococcal Conjugate-13 07/07/2016  . Pneumococcal Polysaccharide-23 11/21/2003, 10/05/2017  . Tdap 05/28/2016   Pertinent  Health Maintenance Due  Topic Date Due  . FOOT EXAM  04/17/2020  . OPHTHALMOLOGY EXAM  10/02/2020  . HEMOGLOBIN A1C  12/10/2020  . DEXA SCAN  09/20/2025  . PNA vac Low Risk Adult  Completed  . INFLUENZA VACCINE  Discontinued   Fall Risk  08/24/2020 08/24/2020 07/23/2020 06/19/2020 06/18/2020  Falls in the past year? 0 0 0 0 0  Number falls in past yr: 0 - 0 0 0  Comment - - - - -  Injury with Fall? 0 - 0 0 0  Risk for fall due to : - - - - -  Follow up - - - - -   Functional Status Survey:    There were no vitals filed for this visit. There is no height or weight on file to calculate BMI.   Physical Exam Unable to complete on telephone visit.   Labs reviewed: Recent Labs    09/02/19 0841 01/24/20 1148 06/11/20 0757  NA 141 143 140  K 4.6 4.1 4.2  CL 103 104 100  CO2 31 29 30   GLUCOSE 122* 80 127*  BUN 24 16 21   CREATININE 0.97* 0.84 1.11*  CALCIUM 9.7 9.5 9.9   Recent Labs    01/24/20 1148 06/11/20 0757  AST 15 18  ALT 9 13  BILITOT 0.3 0.4  PROT 6.7 7.1   Recent Labs    09/02/19 0841 01/24/20 1148 06/11/20 0757  WBC 14.8* 16.3* 11.8*   NEUTROABS 10,449* 11,117* 8,520*  HGB 13.9 13.9 14.4  HCT 42.6 42.2 44.8  MCV 83.0 84.6 84.8  PLT 373 368 333   Lab Results  Component Value Date   TSH 0.05 (L) 07/28/2020   Lab Results  Component Value Date   HGBA1C 8.4 (H) 06/11/2020   Lab Results  Component Value Date   CHOL 167 06/11/2020   HDL 40 (L) 06/11/2020   LDLCALC 98 06/11/2020   TRIG 197 (H) 06/11/2020   CHOLHDL 4.2 06/11/2020    Significant Diagnostic Results in last 30 days:  No results found.  Assessment/Plan  Acute non-recurrent sinusitis, unspecified location Reports Temp 99.0 she does not think it's related to COVID-19. Nasal/chest congestion,runny nose,cough can breath through the nose. - Advised to use warm wet wash cloth over the face to help with congestion as needed. - Increase fluid intake -  Start on Doxycycline as below.side effects discussed advised to take with Over the counter probiotics.  - doxycycline (VIBRA-TABS) 100 MG tablet; Take 1 tablet (100 mg total) by mouth 2 (two) times daily.  Dispense: 20 tablet; Refill: 0 - Advised to notify provider if symptoms worsen or fail to improve  Family/ staff Communication: Reviewed plan of care with patient verbalized understanding.   Labs/tests ordered: None   Next Appointment: As needed if symptoms worsen or fail to improve.   I connected with  06/13/2020 on 08/24/20 by a Telephone enabled telemedicine application and verified that I am speaking with the correct person using two identifiers.   I discussed the limitations of evaluation and management by telemedicine. The patient expressed understanding and agreed to proceed.  Spent 20 minutes of non-face to face with patient      [name], NP

## 2020-08-24 NOTE — Progress Notes (Signed)
Subjective:    Patient ID: Katherine Delgado, female    DOB: [birth year], 80 y.o.   MRN: [id number]  HPI  Due to national recommendations of social distancing because of COVID 48, an audio/video tele-health visit is felt to be the most appropriate encounter for this patient at this time. See MyChart message from today for the patient's consent to a tele-health encounter with [place]. This is a follow up tele-visit via phone. The patient is at home. MD is at office.   Katherine Delgado is a 80 year old woman who presents for follow-up of her diffuse pains and cold symptoms.   Congestion: She is having cold symptoms, head congestion. She has Vicks cough and cold. She has been drinking liquids.   Pain: She has been using Oxycodone 5mg  tablets twice 3 times per day. Sometimes she needs a 4th at night. She asks about the prednisone we had discussed last visit.  Insomnia: She has been taking Amitriptyline 25mg  which provides her with about three hours of sleep.  Constipation: well controlled with medication.   Prior history: She has been taking three Norco per day and finds this helps but wanted to see if she can take anything stronger. She has pain in her back as well as throughout her joints due to her autoimmune conditions. She feels that physically she is getting worse in term of her conditioning. She does not take steroid currently. She has taken Prednisone in the past with good benefit. Her son is also present for the appointment. A side effect she has had with prednisone is elevated blood sugar- she is already on several medications for diabetes.   Prior history:  Pain is constant. Medication helps but it does not go away. Pain can be in arms, leg, chest. It tends to be worst in back, hips, and legs. Discussed trigger point injections which she would like to try.   She has never tried blue emu oil. She has tried Lidocaine patches in the past.   She is almost  never walking due to her pain. She is not doing much walking at home either. She can only stand 5 minutes before she has to sit down.   Prior history:  Her cold symptoms are getting better but she did not spread germs to our office members.   Her pain has worsened as a result of her cold, but she does feel that the medication is helping. She would like to maintain the same dose.  She has been eating less and drinking more water and as a result has lost weight and her CBGs are better controlled.  Constipation has been well controlled with Colace.   Her pain has been quite severe. She is feeling better. Her average pain is 8/10. She has been to do a little bit more around the house. She is able to fold clothes when her son brings them in. She has not been able to walk much more. She does walk within the house, to go to the bathroom. She is limited by the knee pain and the radiating back pain, as well as her decreased strength. She has never had an epidural injection into the back.   Discussed the results of her lumbar MRI with her: Abnormal MRI lumbar spine (without) demonstrating: 1. At L4-5: disc bulging, disc protrusion, facet and ligamentum flavum hypertrophy, resulting in severe spinal stenosis and severe biforaminal stenosis. Also degenerative endplate and marrow edema at L4-5 with STIR hyperintense  signal. 2. At L3-4: disc bulging, facet hypertrophy with moderate-severe spinal stenosis and severe biforaminal stenosis. 3. At L5-S1: loss of disc space height, facet hypertrophy, left hemi-laminectomy, with no spinal stenosis or foraminal narrowing. 4. Compared to MRI on 10/19/12, there has been significant worsening of degenerative spine disease, especially at L4-5 level.  She had surgery on November 12th, 2019. This did help her and she wen through rehab afterward. But now she feels worse than before the surgery.    Pain Inventory Average Pain 8 Pain Right Now 8 My pain is constant,  sharp, burning, stabbing, tingling and aching  In the last 24 hours, has pain interfered with the following? General activity 10 Relation with others 10 Enjoyment of life 10 What TIME of day is your pain at its worst? night Sleep (in general) Poor  Pain is worse with: walking, bending, sitting, standing and some activites Pain improves with: rest and medication Relief from Meds: 5  Family History  Problem Relation Age of Onset  . Alzheimer's disease Mother   . Heart disease Mother   . Heart disease Father   . Liver disease Father   . Cancer Brother   . Arthritis Son   . Colon polyps Brother   . Colon cancer Neg Hx   . Esophageal cancer Neg Hx   . Kidney disease Neg Hx   . Stomach cancer Neg Hx   . Rectal cancer Neg Hx    Social History   Socioeconomic History  . Marital status: Widowed    Spouse name: Not on file  . Number of children: 2  . Years of education: 89  . Highest education level: Not on file  Occupational History  . Occupation: Retired  Tobacco Use  . Smoking status: Former Smoker    Packs/day: 0.10    Years: 10.00    Pack years: 1.00    Types: Cigarettes    Quit date: 12/07/1979    Years since quitting: 40.7  . Smokeless tobacco: Never Used  Vaping Use  . Vaping Use: Never used  Substance and Sexual Activity  . Alcohol use: No    Alcohol/week: 0.0 standard drinks  . Drug use: No  . Sexual activity: Not Currently  Other Topics Concern  . Not on file  Social History Narrative   Walks with cane   Right handed    Caffeine use: Coffee (2 cups every morning)   Tea: sometimes   Soda: none   Social Determinants of Radio broadcast assistant Strain: Not on file  Food Insecurity: Not on file  Transportation Needs: Not on file  Physical Activity: Not on file  Stress: Not on file  Social Connections: Not on file   Past Surgical History:  Procedure Laterality Date  . ABDOMINAL HYSTERECTOMY  1974  . abdominal tumor  2002  . APPENDECTOMY    .  APPLICATION OF A-CELL OF BACK N/A 09/26/2018   Procedure: With Acell;  Surgeon: Wallace Going, DO;  Location: [facility];  Service: Plastics;  Laterality: N/A;  . APPLICATION OF WOUND VAC N/A 09/26/2018   Procedure: Vac placement;  Surgeon: Wallace Going, DO;  Location: [facility];  Service: Plastics;  Laterality: N/A;  . [place]  . BRONCHOSCOPY  2001  . CATARACT EXTRACTION, BILATERAL  09/2019, 10/2019  . CHOLECYSTECTOMY  1984  . INCISION AND DRAINAGE OF WOUND N/A 09/26/2018   Procedure: Debridement of spine wound;  Surgeon: Wallace Going, DO;  Location[facility]  OR;  Service: Government social research officer;  Laterality: N/A;  . KNEE SURGERY Bilateral 08/27/2010 (L) and 01/11/2011 (R)  . LUMBAR LAMINECTOMY/DECOMPRESSION MICRODISCECTOMY N/A 07/03/2018   Procedure: Lumbar Three to Lumbar Five Laminectomy;  Surgeon: Jadene Pierini, MD;  Location: [facility];  Service: Neurosurgery;  Laterality: N/A;  . LUMBAR WOUND DEBRIDEMENT N/A 08/20/2018   Procedure: POSTERIOR LUMBAR SPINAL WOUND DEBRIDEMENT AND REVISION;  Surgeon: Jadene Pierini, MD;  Location: [facility] OR;  Service: Neurosurgery;  Laterality: N/A;  POSTERIOR LUMBAR SPINAL WOUND REVISION  . miscarrage  1962  . OVARIAN CYST SURGERY  1968  . thymus tumor    . 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 TONSILLECTOMY     Past Surgical History:  Procedure Laterality Date  . ABDOMINAL HYSTERECTOMY  1974  . abdominal tumor  2002  . APPENDECTOMY    . APPLICATION OF A-CELL OF BACK N/A 09/26/2018   Procedure: With Acell;  Surgeon: Peggye Form, DO;  Location: [facility] OR;  Service: Plastics;  Laterality: N/A;  . APPLICATION OF WOUND VAC N/A 09/26/2018   Procedure: Vac placement;  Surgeon: Peggye Form, DO;  Location: [facility] OR;  Service: Plastics;  Laterality: N/A;  . BACK SURGERY  1982  . BRONCHOSCOPY  2001  . CATARACT EXTRACTION, BILATERAL  09/2019, 10/2019  . CHOLECYSTECTOMY  1984  . INCISION AND DRAINAGE OF WOUND N/A 09/26/2018   Procedure: Debridement of spine wound;   Surgeon: Peggye Form, DO;  Location: [facility] OR;  Service: Plastics;  Laterality: N/A;  . KNEE SURGERY Bilateral 08/27/2010 (L) and 01/11/2011 (R)  . LUMBAR LAMINECTOMY/DECOMPRESSION MICRODISCECTOMY N/A 07/03/2018   Procedure: Lumbar Three to Lumbar Five Laminectomy;  Surgeon: Jadene Pierini, MD;  Location: [facility] OR;  Service: Neurosurgery;  Laterality: N/A;  . LUMBAR WOUND DEBRIDEMENT N/A 08/20/2018   Procedure: POSTERIOR LUMBAR SPINAL WOUND DEBRIDEMENT AND REVISION;  Surgeon: Jadene Pierini, MD;  Location: [facility] OR;  Service: Neurosurgery;  Laterality: N/A;  POSTERIOR LUMBAR SPINAL WOUND REVISION  . miscarrage  1962  . OVARIAN CYST SURGERY  1968  . thymus tumor    . 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 TONSILLECTOMY     Past Medical History:  Diagnosis Date  . Abnormality of gait 04/19/2016  . Acute infective polyneuritis (HCC) 2002  . Allergic rhinitis due to pollen   . Chronic pain syndrome   . COPD (chronic obstructive pulmonary disease) (HCC)    chronic bronchitis  . Depressive disorder, not elsewhere classified   . Diabetes mellitus without complication (HCC)   . Diaphragmatic hernia without mention of obstruction or gangrene   . Dyslipidemia   . Fibromyalgia   . GERD (gastroesophageal reflux disease)    with h/o esophagitis  . Guillain-Barre syndrome (HCC)   . History of benign thymus tumor   . Insomnia, unspecified   . Lumbar spinal stenosis 03/30/2018   L4-5 level, severe  . Miscarriage 1962  . Mixed hyperlipidemia   . Morbid obesity (HCC)   . Osteoporosis   . Pneumonia   . Polyneuropathy in diabetes(357.2)   . Rheumatoid arthritis(714.0)   . Spondylosis, lumbosacral   . Spontaneous ecchymoses   . Type II or unspecified type diabetes mellitus with peripheral circulatory disorders, uncontrolled(250.72)   . Unspecified chronic bronchitis (HCC)   . Unspecified essential hypertension   . Unspecified hypothyroidism   . Unspecified pruritic disorder   . Unspecified urinary  incontinence    There were no vitals taken for this visit.  Opioid Risk Score:  Fall Risk Score:  `1  Depression screen PHQ 2/9  Depression screen [facility] 2/9 07/23/2020 06/19/2020 06/15/2020 03/12/2020 02/19/2020 02/10/2020 02/05/2020  Decreased Interest 0 0 0 0 0 0 3  Down, Depressed, Hopeless 0 0 0 0 0 0 0  PHQ - 2 Score 0 0 0 0 0 0 3  Altered sleeping - - - - 0 - 3  Tired, decreased energy - - - - 0 - 2  Change in appetite - - - - 0 - 2  Feeling bad or failure about yourself  - - - - 0 - 0  Trouble concentrating - - - - 0 - 1  Moving slowly or fidgety/restless - - - - 0 - 0  Suicidal thoughts - - - - 0 - 0  PHQ-9 Score - - - - 0 - 11  Difficult doing work/chores - - - - - - Extremely dIfficult  Some recent data might be hidden   Review of Systems  Constitutional: Positive for fatigue.  HENT: Negative.   Eyes: Negative.   Respiratory: Negative.   Cardiovascular: Positive for leg swelling.  Gastrointestinal: Negative.   Endocrine: Negative.   Genitourinary: Negative.   Musculoskeletal: Positive for arthralgias, back pain and gait problem.  Skin: Negative.   Allergic/Immunologic: Negative.   Neurological: Positive for weakness and numbness.       Tingling  Hematological: Negative.   Psychiatric/Behavioral: Negative.        Objective:   Physical Exam Not performed as patient was seen via phone.     Assessment & Plan:  Katherine Delgado is a 80 year old woman who presents with chronic pain syndrome, failed back syndrome, lumbar stenosis, and bilateral knee osteoarthritis, and severe insomnia.  1) Severe insomnia: Amitriptyline is helping. No side effects. Currently taking 25mg  last visit. Increase to 50mg  as this is only providing her about 3 hours of benefit.    2) Chronic pain syndrome secondary to lumbar spinal stenosis, failed back syndrome, and bilateral knee osteoarthritis that no longer benefits from injections.  Pain contract previously signed and UDS obtained.  Discussed role of exercise and anti-inflammatory diet in pain relief. Recommended exercise bike given limited mobility and low carb diet. Reviewed the results of MRI with her (as in HPI). She has made good dietary changes. The medication has been helping. I have prescribed Lidocaine patches to use daily, blue emu oil OTC. Will try trigger point injections for myofascial pain next visit. -The Norco is helping but the oxycodone seemed to work better. Discussed whether she wants to switch back to oxycodone. She did better with the oxycodone 5mg  twice in the morning, afternoon, and night. Increase Amitriptyline as above to help with sleep and pain and to minimize use of an additional oxycodone at night. -Her pain is worst at night.   3) Obesity class 2: Monitor each visit. Can contribute to chronic disease and worsening pain. Lifestyle changes as above. Weight today is 239 lbs, this is down from 243 last visit. Commended on this.   4) Incontinence of bladder. Discussed timed voiding q2H. Cranberry supplements won't hurt, but help more for UTIs. Can continue Myrbetriq. Discussed that constipation could be a factor.   5) Congestive heart failure: educated that purpose of Lasix is to treat CHF and Myrbetric to treat urinary retention. Advised to follow-up with Dr. Mariea Clonts given her increased weight since last visit, which she attributes to fluid retention. She may benefit from a higher dose of Lasix.   6) Constipation:  Continue  colace which is helping constipation.   7) Type 2 DM: Commended on progress! Glucose has been stable- she has been eating less sugar and drinking more water.   8) Impaired mobility and ADLs.: Encouraged to get out of the wheelchair every hour to increase blood flow, maintain muscle strength, and to prevent pressure injury. Discussed home therapies.  -Her mobility continues to be quite impaired. She would like to be able to get out of her wheelchair more frequently.   9) Autoimmune  conditions: -Discussed that steroids can be very helpful for this. Discussed the side effects of steroids. Will consider next visit as not recommended during an active infection given suppressed immune system  10) Viral upper respiratory infection: She has contacted her PCP. Continue Mucinex, cough syrup as needed. Recommended hot water with honey, lime, and ginger. Afebrile.   10 minutes spent in discussion of pain, oxycodone use, sleep, Amitriptyline use, increasing Amitriptyline dose, limiting oxycodone to three times per day, steroids in future as an option but not while she has an infection, her current medications for her infection, drinking honey lime and ginger for her viral infection, constipation is well controlled with colace.

## 2020-08-24 NOTE — Telephone Encounter (Signed)
rx sent to pharmacy by e-script  

## 2020-08-24 NOTE — Patient Instructions (Signed)

## 2020-08-25 ENCOUNTER — Encounter: Payer: Medicare Other | Admitting: Physical Medicine and Rehabilitation

## 2020-08-27 ENCOUNTER — Ambulatory Visit (INDEPENDENT_AMBULATORY_CARE_PROVIDER_SITE_OTHER): Payer: Medicare Other | Admitting: Family

## 2020-08-27 ENCOUNTER — Other Ambulatory Visit: Payer: Self-pay

## 2020-08-27 ENCOUNTER — Encounter: Payer: Self-pay | Admitting: Family

## 2020-08-27 VITALS — BP 130/60 | HR 74 | Temp 98.4°F | Resp 18 | Ht 66.0 in

## 2020-08-27 DIAGNOSIS — R058 Other specified cough: Secondary | ICD-10-CM

## 2020-08-27 DIAGNOSIS — R0989 Other specified symptoms and signs involving the circulatory and respiratory systems: Secondary | ICD-10-CM | POA: Diagnosis not present

## 2020-08-27 LAB — POCT INFLUENZA A/B
Influenza A, POC: NEGATIVE
Influenza B, POC: NEGATIVE

## 2020-08-27 LAB — POCT RAPID STREP A (OFFICE): Rapid Strep A Screen: NEGATIVE

## 2020-08-27 MED ORDER — AZITHROMYCIN 250 MG PO TABS: ORAL_TABLET | ORAL | 0 refills | Status: DC

## 2020-08-27 MED ORDER — PREDNISONE 10 MG PO TABS: ORAL_TABLET | ORAL | 0 refills | Status: DC

## 2020-08-27 NOTE — Progress Notes (Signed)
Provider: Saanya Zieske FNP-C  Gayland Curry, DO  Patient Care Team: Gayland Curry, DO as PCP - General (Geriatric Medicine) Bo Merino, MD (Rheumatology) Altheimer, Legrand Como, MD as Attending Physician (Endocrinology) Newt Minion, MD as Consulting Physician (Orthopedic Surgery)  Extended Emergency Contact Information Primary Emergency Contact: [facility] Address: [address]          [place],  [address] Johnnette Litter of [place] Phone: [phone] Relation: Son Secondary Emergency Contact: Marlana Latus, [place] of [place] Mobile Phone: [phone] Relation: Son  Code Status:  Full Code  Goals of care: Advanced Directive information Advanced Directives 08/27/2020  Does Patient Have a Medical Advance Directive? Yes  Type of Advance Directive Out of facility DNR (pink MOST or yellow form)  Does patient want to make changes to medical advance directive? No - Patient declined  Copy of [place] in Chart? -  Would patient like information on creating a medical advance directive? -  Pre-existing out of facility DNR order (yellow form or pink MOST form) -     Chief Complaint  Patient presents with  . Acute Visit    Complains of chest/head congestion, low grade fever 99, SOB, and sore throat;    HPI:  Pt is a 80 y.o. female seen today for an acute visit for evaluation of chest ,head congestion,sore throat ,low grade fever 99.0 and shortness of breath.states voice has been hoarse but better today. Has had no loss of taste but today couldn't smell lyson.No nausea,vomiting or diarrhea.appetite has been bad but tries to get soup and fluids  She was seen via video visit 08/24/2020 for similar symptoms treated with Doxycycline which she has not completed.     Past Medical History:  Diagnosis Date  . Abnormality of gait 04/19/2016  . Acute infective polyneuritis ([place]) 2002  . Allergic rhinitis due to  pollen   . Chronic pain syndrome   . COPD (chronic obstructive pulmonary disease) (HCC)    chronic bronchitis  . Depressive disorder, not elsewhere classified   . Diabetes mellitus without complication ([place])   . Diaphragmatic hernia without mention of obstruction or gangrene   . Dyslipidemia   . Fibromyalgia   . GERD (gastroesophageal reflux disease)    with h/o esophagitis  . Guillain-Barre syndrome ([place])   . History of benign thymus tumor   . Insomnia, unspecified   . Lumbar spinal stenosis 03/30/2018   L4-5 level, severe  . Miscarriage 1962  . Mixed hyperlipidemia   . Morbid obesity ([place])   . Osteoporosis   . Pneumonia   . Polyneuropathy in diabetes(357.2)   . Rheumatoid arthritis(714.0)   . Spondylosis, lumbosacral   . Spontaneous ecchymoses   . Type II or unspecified type diabetes mellitus with peripheral circulatory disorders, uncontrolled(250.72)   . Unspecified chronic bronchitis ([place])   . Unspecified essential hypertension   . Unspecified hypothyroidism   . Unspecified pruritic disorder   . Unspecified urinary incontinence    Past Surgical History:  Procedure Laterality Date  . ABDOMINAL HYSTERECTOMY  1974  . abdominal tumor  2002  . APPENDECTOMY    . APPLICATION OF A-CELL OF BACK N/A 09/26/2018   Procedure: With Acell;  Surgeon: Wallace Going, DO;  Location: [place] [facility];  Service: Plastics;  Laterality: N/A;  . APPLICATION OF WOUND VAC N/A 09/26/2018   Procedure: Vac placement;  Surgeon: Wallace Going, DO;  Location: [place];  Service:  Plastics;  Laterality: N/A;  . [place]  . BRONCHOSCOPY  2001  . CATARACT EXTRACTION, BILATERAL  09/2019, 10/2019  . CHOLECYSTECTOMY  1984  . INCISION AND DRAINAGE OF WOUND N/A 09/26/2018   Procedure: Debridement of spine wound;  Surgeon: Wallace Going, DO;  Location: [facility];  Service: Plastics;  Laterality: N/A;  . KNEE SURGERY Bilateral 08/27/2010 (L) and 01/11/2011 (R)  . LUMBAR LAMINECTOMY/DECOMPRESSION  MICRODISCECTOMY N/A 07/03/2018   Procedure: Lumbar Three to Lumbar Five Laminectomy;  Surgeon: Judith Part, MD;  Location: [place] [facility];  Service: Neurosurgery;  Laterality: N/A;  . LUMBAR WOUND DEBRIDEMENT N/A 08/20/2018   Procedure: POSTERIOR LUMBAR SPINAL WOUND DEBRIDEMENT AND REVISION;  Surgeon: Judith Part, MD;  Location: [place];  Service: Neurosurgery;  Laterality: N/A;  POSTERIOR LUMBAR SPINAL WOUND REVISION  . [place]  . OVARIAN CYST SURGERY  1968  . thymus tumor    . 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 TONSILLECTOMY      Allergies  Allergen Reactions  . Influenza Vaccines Other (See Comments)    "h/o Guillain Barre; dr's told me years ago never to take another flu shot as it could relapse Rosalee Kaufman; has to do with when vaccine being changed to H1N1 virus"  . Penicillins Hives, Itching, Swelling and Rash    Has patient had a PCN reaction causing immediate rash, facial/tongue/throat swelling, SOB or lightheadedness with hypotension:Unknown Has patient had a PCN reaction causing severe rash involving mucus membranes or skin necrosis: Unknown Has patient had a PCN reaction that required hospitalization:Unknown Has patient had a PCN reaction occurring within the last 10 years: Unknown If all of the above answers are "NO", then may proceed with Cephalosporin use.   . Statins Other (See Comments)    Myopathy, transaminitis  . Sulfamethoxazole-Trimethoprim Itching    Outpatient Encounter Medications as of 08/27/2020  Medication Sig  . acetaminophen (TYLENOL) 325 MG tablet Take 650 mg by mouth every 6 (six) hours as needed (for pain.).  Marland Kitchen amitriptyline (ELAVIL) 50 MG tablet Take 1 tablet (50 mg total) by mouth at bedtime.  Marland Kitchen amLODipine (NORVASC) 10 MG tablet TAKE 1 TABLET(10 MG) BY MOUTH DAILY  . aspirin EC 81 MG tablet Take 81 mg by mouth 2 (two) times a week.  Marland Kitchen azithromycin (ZITHROMAX) 250 MG tablet Take 2 Tablet by mouth x 1 dose today then take one by mouth daily x 4  days  . Chlorphen-Phenyleph-ASA (ALKA-SELTZER PLUS COLD PO) Take by mouth as needed.  . cholecalciferol (VITAMIN D3) 25 MCG (1000 UT) tablet Take 1,000 Units by mouth daily.  Marland Kitchen docusate sodium (COLACE) 100 MG capsule Take 100 mg by mouth daily as needed (constipation.).   Marland Kitchen doxycycline (VIBRA-TABS) 100 MG tablet Take 1 tablet (100 mg total) by mouth 2 (two) times daily.  Marland Kitchen ezetimibe (ZETIA) 10 MG tablet Take 1 tablet (10 mg total) by mouth daily.  . folic acid (FOLVITE) 1 MG tablet TAKE 2 TABLETS BY MOUTH  DAILY  . furosemide (LASIX) 20 MG tablet TAKE 1 TABLET BY MOUTH TWICE DAILY AS NEEDED FOR 3 POUND WEIGHT GAIN IN ONE DAY OR 5 POUNDS IN ONE WEEK  . gabapentin (NEURONTIN) 600 MG tablet TAKE 1 TABLET(600 MG) BY MOUTH THREE TIMES DAILY  . Insulin Lispro (HUMALOG KWIKPEN) 200 UNIT/ML SOPN Inject 20 Units into the skin 3 (three) times daily.  . Insulin Pen Needle (B-D UF III MINI PEN NEEDLES) 31G X 5 MM MISC USE AS DIRECTED  .  levothyroxine (SYNTHROID) 125 MCG tablet TAKE 1 TABLET BY MOUTH EVERY DAY 30 MINUTES BEFORE BREAKFAST  . lidocaine (LIDODERM) 5 % Place 1 patch onto the skin daily. Remove & Discard patch within 12 hours or as directed by MD  . lisinopril (ZESTRIL) 20 MG tablet Take one tablet by mouth once daily.  Marland Kitchen loperamide (IMODIUM A-D) 2 MG tablet Take 2 mg by mouth 4 (four) times daily as needed for diarrhea or loose stools.  . magnesium oxide (MAG-OX) 400 MG tablet Take 1 tablet (400 mg total) by mouth daily as needed (for leg cramps).  . Melatonin 10 MG TABS Take 10 mg by mouth at bedtime.  . metoprolol tartrate (LOPRESSOR) 50 MG tablet Take one tablet by mouth twice daily.  . Multiple Vitamins-Minerals (MULTIVITAMIN ADULT PO) Take 1 tablet by mouth daily.  Marland Kitchen nystatin (NYSTATIN) powder APPLY TOPICALLY THREE TIMES DAILY AS DIRECTED FOR 14 DAYS and prn yeast infection  . nystatin cream (MYCOSTATIN) Apply 1 application topically 2 (two) times daily. To skin folds  . omega-3 acid ethyl  esters (LOVAZA) 1 g capsule TAKE ONE CAPSULE BY MOUTH EVERY DAY  . oxyCODONE (ROXICODONE) 5 MG immediate release tablet Take 2 tablets (10 mg total) by mouth 3 (three) times daily as needed for severe pain.  . polyvinyl alcohol (LIQUIFILM TEARS) 1.4 % ophthalmic solution Place 1 drop into both eyes 4 (four) times daily as needed (for dry/irritated eyes).   . potassium chloride (MICRO-K) 10 MEQ CR capsule Take 2 capsules (20 mEq total) by mouth 2 (two) times daily as needed (when taking a dose of lasix.).  Marland Kitchen predniSONE (DELTASONE) 10 MG tablet Take 6 tablets ( 60 mg ) by mouth  x 1 day then  Take 5 tablet ( 50 mg ) by mouth x 1 day then  Take 4 Tablet (40 mg ) by mouth x 1 day then  Take 3 Tablet ( 30 ) by mouth x 1 day then  Take 2 Tablet ( 20 mg ) by mouth x 1 day then  Take 1 Tablet ( 10 Mg )  By mouth x 1 day then stop.  Marland Kitchen tiotropium (SPIRIVA) 18 MCG inhalation capsule Place 1 capsule (18 mcg total) into inhaler and inhale daily.  Marland Kitchen tiZANidine (ZANAFLEX) 2 MG tablet TAKE 1 TABLET(2 MG) BY MOUTH THREE TIMES DAILY  . TOUJEO SOLOSTAR 300 UNIT/ML Solostar Pen INJECT 100 UNITS INTO THE SKIN EVERY NIGHT AT BEDTIME  . triamterene-hydrochlorothiazide (MAXZIDE-25) 37.5-25 MG tablet TAKE 1 TABLET BY MOUTH  DAILY  . venlafaxine XR (EFFEXOR-XR) 75 MG 24 hr capsule TAKE 1 CAPSULE BY MOUTH EVERY DAY WITH BREAKFAST  . Vibegron (GEMTESA) 75 MG TABS Take 1 tablet by mouth daily.  Marland Kitchen VICTOZA 18 MG/3ML SOPN ADMINISTER 1.8 MG UNDER THE SKIN DAILY FOR BLOOD SUGAR  . XELJANZ 5 MG TABS TAKE 1 TABLET BY MOUTH  DAILY  . sodium chloride (TGT SALINE NASAL SPRAY) 0.65 % nasal spray Place 2 sprays into the nose as needed for up to 14 days for congestion.   No facility-administered encounter medications on file as of 08/27/2020.    Review of Systems  Constitutional: Positive for appetite change and fever. Negative for chills and fatigue.  HENT: Positive for congestion, rhinorrhea and sore throat. Negative for ear pain,  sinus pressure, sinus pain, sneezing and trouble swallowing.        Uses nasal spray and vicks   Eyes: Negative for discharge, redness and itching.  Respiratory: Positive for cough.  Negative for chest tightness, shortness of breath and wheezing.   Cardiovascular: Negative for chest pain, palpitations and leg swelling.  Gastrointestinal: Negative for abdominal distention, abdominal pain, diarrhea, nausea and vomiting.  Musculoskeletal: Positive for gait problem.  Skin: Negative for color change, pallor and rash.  Neurological: Negative for dizziness, light-headedness and headaches.  Psychiatric/Behavioral: Negative for agitation and sleep disturbance.    Immunization History  Administered Date(s) Administered  . Influenza-Unspecified 06/10/2011  . PFIZER SARS-COV-2 Vaccination 11/13/2019, 11/30/2019  . Pneumococcal Conjugate-13 07/07/2016  . Pneumococcal Polysaccharide-23 11/21/2003, 10/05/2017  . Tdap 05/28/2016   Pertinent  Health Maintenance Due  Topic Date Due  . FOOT EXAM  04/17/2020  . OPHTHALMOLOGY EXAM  10/02/2020  . HEMOGLOBIN A1C  12/10/2020  . DEXA SCAN  09/20/2025  . PNA vac Low Risk Adult  Completed  . INFLUENZA VACCINE  Discontinued   Fall Risk  08/27/2020 08/24/2020 08/24/2020 07/23/2020 06/19/2020  Falls in the past year? 0 0 0 0 0  Number falls in past yr: 0 0 - 0 0  Comment - - - - -  Injury with Fall? 0 0 - 0 0  Risk for fall due to : - - - - -  Follow up - - - - -   Functional Status Survey:    Vitals:   08/27/20 1100  BP: 130/60  Pulse: 74  Resp: 18  Temp: 98.4 F (36.9 C)  SpO2: 95%  Height: 5\' 6"  (1.676 m)   Body mass index is 37.45 kg/m. Physical Exam Vitals reviewed.  Constitutional:      General: She is not in acute distress.    Appearance: She is not ill-appearing.  HENT:     Head: Normocephalic.     Right Ear: Tympanic membrane, ear canal and external ear normal. There is no impacted cerumen.     Left Ear: Tympanic membrane, ear canal  and external ear normal. There is no impacted cerumen.     Nose: Congestion and rhinorrhea present.     Mouth/Throat:     Mouth: Mucous membranes are moist.     Pharynx: Oropharynx is clear. No oropharyngeal exudate or posterior oropharyngeal erythema.  Eyes:     General: No scleral icterus.       Right eye: No discharge.        Left eye: No discharge.     Conjunctiva/sclera: Conjunctivae normal.     Pupils: Pupils are equal, round, and reactive to light.  Neck:     Vascular: No carotid bruit.  Cardiovascular:     Rate and Rhythm: Normal rate and regular rhythm.     Pulses: Normal pulses.     Heart sounds: Normal heart sounds. No murmur heard. No friction rub. No gallop.   Pulmonary:     Effort: Pulmonary effort is normal.     Breath sounds: Examination of the right-upper field reveals wheezing. Examination of the left-upper field reveals wheezing and rales. Examination of the right-middle field reveals rales. Examination of the left-middle field reveals rales. Wheezing and rales present.  Abdominal:     General: Bowel sounds are normal. There is no distension.     Palpations: Abdomen is soft. There is no mass.     Tenderness: There is no abdominal tenderness. There is no right CVA tenderness, left CVA tenderness, guarding or rebound.  Musculoskeletal:        General: No swelling or tenderness.     Cervical back: Normal range of motion. No rigidity or tenderness.  Right lower leg: No edema.     Left lower leg: No edema.     Comments: On wheelchair during visit  Lymphadenopathy:     Cervical: No cervical adenopathy.  Skin:    General: Skin is warm and dry.     Coloration: Skin is not pale.     Findings: No bruising, erythema or rash.  Neurological:     Mental Status: She is alert and oriented to person, place, and time.     Cranial Nerves: No cranial nerve deficit.     Motor: No weakness.     Gait: Gait abnormal.  Psychiatric:        Mood and Affect: Mood normal.         Behavior: Behavior normal.        Thought Content: Thought content normal.        Judgment: Judgment normal.     Labs reviewed: Recent Labs    09/02/19 0841 01/24/20 1148 06/11/20 0757  NA 141 143 140  K 4.6 4.1 4.2  CL 103 104 100  CO2 31 29 30   GLUCOSE 122* 80 127*  BUN 24 16 21   CREATININE 0.97* 0.84 1.11*  CALCIUM 9.7 9.5 9.9   Recent Labs    01/24/20 1148 06/11/20 0757  AST 15 18  ALT 9 13  BILITOT 0.3 0.4  PROT 6.7 7.1   Recent Labs    09/02/19 0841 01/24/20 1148 06/11/20 0757  WBC 14.8* 16.3* 11.8*  NEUTROABS 10,449* 11,117* 8,520*  HGB 13.9 13.9 14.4  HCT 42.6 42.2 44.8  MCV 83.0 84.6 84.8  PLT 373 368 333   Lab Results  Component Value Date   TSH 0.05 (L) 07/28/2020   Lab Results  Component Value Date   HGBA1C 8.4 (H) 06/11/2020   Lab Results  Component Value Date   CHOL 167 06/11/2020   HDL 40 (L) 06/11/2020   LDLCALC 98 06/11/2020   TRIG 197 (H) 06/11/2020   CHOLHDL 4.2 06/11/2020    Significant Diagnostic Results in last 30 days:  No results found.  Assessment/Plan 1. Symptoms of upper respiratory infection (URI) Congestion,runny nose and sore throat reported.some loss of sense of smell here in office.will rule out COVID-19,strep and Flu.  - POC Influenza A/B negative. - POC Rapid Strep A negative  - SARS-COV-2 RNA,(COVID-19) QUAL NAAT - azithromycin (ZITHROMAX) 250 MG tablet; Take 2 Tablet by mouth x 1 dose today then take one by mouth daily x 4 days  Dispense: 6 tablet; Refill: 0  2. Nonproductive cough Bilateral upper lobe breath sound wheezes with rales that improves with cough noted on mid left lobe.will treat clinically for possible PNA verse COVID-19 infections with Z-pak and prednisone as below.Advised to monitor blood sugars closely and notify provider if over 200 while on prednisone.  - azithromycin (ZITHROMAX) 250 MG tablet; Take 2 Tablet by mouth x 1 dose today then take one by mouth daily x 4 days  Dispense: 6 tablet;  Refill: 0 - predniSONE (DELTASONE) 10 MG tablet; Take 6 tablets ( 60 mg ) by mouth  x 1 day then  Take 5 tablet ( 50 mg ) by mouth x 1 day then  Take 4 Tablet (40 mg ) by mouth x 1 day then  Take 3 Tablet ( 30 ) by mouth x 1 day then  Take 2 Tablet ( 20 mg ) by mouth x 1 day then  Take 1 Tablet ( 10 Mg )  By mouth x 1  day then stop.  Dispense: 15 tablet; Refill: 0  Family/ staff Communication: Reviewed plan of care with patient verbalized understanding.   Labs/tests ordered:  - POC Influenza A/B negative. - POC Rapid Strep A negative  - SARS-COV-2 RNA,(COVID-19) QUAL NAAT  Next Appointment: As needed if symptoms worsen or fail to improve.   Sandrea Hughs, NP

## 2020-08-27 NOTE — Patient Instructions (Signed)
-   Stop Doxycycline  - Take Prednisone 10 mg tablet Take 6 tablets ( 60 mg ) by mouth  x 1 day then  Take 5 tablet ( 50 mg ) by mouth x 1 day then  Take 4 Tablet (40 mg ) by mouth x 1 day then  Take 3 Tablet ( 30 ) by mouth x 1 day then  Take 2 Tablet ( 20 mg ) by mouth x 1 day then  Take 1 Tablet ( 10 Mg )  By mouth x 1 day then stop.  - Notify provider or go to ED if symptoms worsen or having any shortness of breath.

## 2020-08-28 ENCOUNTER — Ambulatory Visit: Payer: Medicare Other | Admitting: Physical Medicine and Rehabilitation

## 2020-08-28 ENCOUNTER — Telehealth: Payer: Self-pay | Admitting: *Deleted

## 2020-08-28 NOTE — Telephone Encounter (Signed)
Patient called and stated that she saw you yesterday for URI. Stated that she thought you wanted her to go have a Chest X-Ray done but when she called [facility] they told her that they did not have an order for her to have one done.   Patient wants to know if you were wanting her to have a Chest X-Ray done.  Please Advise.

## 2020-08-28 NOTE — Telephone Encounter (Signed)
Patient notified and agreed.  

## 2020-08-28 NOTE — Telephone Encounter (Signed)
No CXR ordered yesterday.treated with Z-pak and prednisone.

## 2020-08-30 LAB — SARS-COV-2 RNA,(COVID-19) QUALITATIVE NAAT: SARS CoV2 RNA: UNDETERMINED — AB

## 2020-08-31 ENCOUNTER — Encounter: Payer: Self-pay | Admitting: Family

## 2020-08-31 ENCOUNTER — Other Ambulatory Visit: Payer: Self-pay

## 2020-08-31 ENCOUNTER — Ambulatory Visit (INDEPENDENT_AMBULATORY_CARE_PROVIDER_SITE_OTHER): Payer: Medicare Other | Admitting: Family

## 2020-08-31 VITALS — BP 140/60 | HR 81 | Temp 97.5°F | Resp 18 | Ht 66.0 in

## 2020-08-31 DIAGNOSIS — R0989 Other specified symptoms and signs involving the circulatory and respiratory systems: Secondary | ICD-10-CM

## 2020-08-31 DIAGNOSIS — R058 Other specified cough: Secondary | ICD-10-CM

## 2020-08-31 LAB — CBC WITH DIFFERENTIAL/PLATELET
Absolute Monocytes: 954 cells/uL — ABNORMAL HIGH (ref 200–950)
Basophils Absolute: 150 cells/uL (ref 0–200)
Basophils Relative: 0.8 %
Eosinophils Absolute: 206 cells/uL (ref 15–500)
Eosinophils Relative: 1.1 %
HCT: 45.7 % — ABNORMAL HIGH (ref 35.0–45.0)
Hemoglobin: 15.1 g/dL (ref 11.7–15.5)
Lymphs Abs: 1833 cells/uL (ref 850–3900)
MCH: 27.6 pg (ref 27.0–33.0)
MCHC: 33 g/dL (ref 32.0–36.0)
MCV: 83.5 fL (ref 80.0–100.0)
MPV: 10.8 fL (ref 7.5–12.5)
Monocytes Relative: 5.1 %
Neutro Abs: 15558 cells/uL — ABNORMAL HIGH (ref 1500–7800)
Neutrophils Relative %: 83.2 %
Platelets: 422 10*3/uL — ABNORMAL HIGH (ref 140–400)
RBC: 5.47 10*6/uL — ABNORMAL HIGH (ref 3.80–5.10)
RDW: 12.5 % (ref 11.0–15.0)
Total Lymphocyte: 9.8 %
WBC: 18.7 10*3/uL — ABNORMAL HIGH (ref 3.8–10.8)

## 2020-08-31 MED ORDER — GUAIFENESIN ER 600 MG PO TB12: 600.0000 mg | ORAL_TABLET | Freq: Two times a day (BID) | ORAL | 0 refills | Status: AC

## 2020-08-31 NOTE — Patient Instructions (Signed)

## 2020-08-31 NOTE — Progress Notes (Signed)
Provider: Ronika Kelson FNP-C  Gayland Curry, DO  Patient Care Team: Gayland Curry, DO as PCP - General (Geriatric Medicine) Bo Merino, MD (Rheumatology) Altheimer, Legrand Como, MD as Attending Physician (Endocrinology) Newt Minion, MD as Consulting Physician (Orthopedic Surgery)  Extended Emergency Contact Information Primary Emergency Contact: [facility] Address: [address]          [place], [place] [address] Johnnette Litter of [place] Phone: [phone] Relation: Son Secondary Emergency Contact: Marlana Latus, [place] Mobile Phone: [phone] Relation: Son  Code Status: Full Code  Goals of care: Advanced Directive information Advanced Directives 08/27/2020  Does Patient Have a Medical Advance Directive? Yes  Type of Advance Directive Out of facility DNR (pink MOST or yellow form)  Does patient want to make changes to medical advance directive? No - Patient declined  Copy of [place] in Chart? -  Would patient like information on creating a medical advance directive? -  Pre-existing out of facility DNR order (yellow form or pink MOST form) -     Chief Complaint  Patient presents with  . Follow-up    Sick/ Retest for Covid.    HPI:  Pt is a 80 y.o. female seen today for an acute visit for evaluation of cough and URI's symptoms.she was here 08/27/2020 for similar symptoms.she was screened for Influenza A and B which was negative.Her SARS-COV-2 RNA test done was inconclusive not all of the testing targets were detected lab reports possible due to viral concentrations near the limit of detection of the test or other factors.An additional sample collection was recommended if still symptomatic. She was treated with a 10 days course of Doxycycline for sinusitis 08/25/2019.on 08/28/2019 visit she was advised to stop Doxycycline and start on Z-pak along with Prednisone taper due to wheezing and rales  noted on exam. Has been taking vit D,C and Zinc and using Inhalers as directed.   States has one more antibiotic and prednisone.she denies any any fever,chills,chest pain or palpitation.Has had some tightness." just get anything up when I cough".   Past Medical History:  Diagnosis Date  . Abnormality of gait 04/19/2016  . Acute infective polyneuritis ([place]) 2002  . Allergic rhinitis due to pollen   . Chronic pain syndrome   . COPD (chronic obstructive pulmonary disease) (HCC)    chronic bronchitis  . Depressive disorder, not elsewhere classified   . Diabetes mellitus without complication ([place])   . Diaphragmatic hernia without mention of obstruction or gangrene   . Dyslipidemia   . Fibromyalgia   . GERD (gastroesophageal reflux disease)    with h/o esophagitis  . Guillain-Barre syndrome ([place])   . History of benign thymus tumor   . Insomnia, unspecified   . Lumbar spinal stenosis 03/30/2018   L4-5 level, severe  . Miscarriage 1962  . Mixed hyperlipidemia   . Morbid obesity ([place])   . Osteoporosis   . Pneumonia   . Polyneuropathy in diabetes(357.2)   . Rheumatoid arthritis(714.0)   . Spondylosis, lumbosacral   . Spontaneous ecchymoses   . Type II or unspecified type diabetes mellitus with peripheral circulatory disorders, uncontrolled(250.72)   . Unspecified chronic bronchitis ([place])   . Unspecified essential hypertension   . Unspecified hypothyroidism   . Unspecified pruritic disorder   . Unspecified urinary incontinence    Past Surgical History:  Procedure Laterality Date  . ABDOMINAL HYSTERECTOMY  1974  . abdominal tumor  2002  . APPENDECTOMY    . APPLICATION OF A-CELL OF BACK N/A 09/26/2018   Procedure: With Acell;  Surgeon: Wallace Going, DO;  Location: [facility];  Service: Plastics;  Laterality: N/A;  . APPLICATION OF WOUND VAC N/A 09/26/2018   Procedure: Vac placement;  Surgeon: Wallace Going, DO;  Location: [facility];  Service: Plastics;  Laterality: N/A;  . [place]  . BRONCHOSCOPY  2001  . CATARACT EXTRACTION, BILATERAL  09/2019, 10/2019  . CHOLECYSTECTOMY  1984  . INCISION AND DRAINAGE OF WOUND N/A 09/26/2018   Procedure: Debridement of spine wound;  Surgeon: Wallace Going, DO;  Location: [facility];  Service: Plastics;  Laterality: N/A;  . KNEE SURGERY Bilateral 08/27/2010 (L) and 01/11/2011 (R)  . LUMBAR LAMINECTOMY/DECOMPRESSION MICRODISCECTOMY N/A 07/03/2018   Procedure: Lumbar Three to Lumbar Five Laminectomy;  Surgeon: Judith Part, MD;  Location: [facility];  Service: Neurosurgery;  Laterality: N/A;  . LUMBAR WOUND DEBRIDEMENT N/A 08/20/2018   Procedure: POSTERIOR LUMBAR SPINAL WOUND DEBRIDEMENT AND REVISION;  Surgeon: Judith Part, MD;  Location: [facility];  Service: Neurosurgery;  Laterality: N/A;  POSTERIOR LUMBAR SPINAL WOUND REVISION  . [place]  . OVARIAN CYST SURGERY  1968  . thymus tumor    . 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 TONSILLECTOMY      Allergies  Allergen Reactions  . Influenza Vaccines Other (See Comments)    "h/o Guillain Barre; dr's told me years ago never to take another flu shot as it could relapse Rosalee Kaufman; has to do with when vaccine being changed to H1N1 virus"  . Penicillins Hives, Itching, Swelling and Rash    Has patient had a PCN reaction causing immediate rash, facial/tongue/throat swelling, SOB or lightheadedness with hypotension:Unknown Has patient had a PCN reaction causing severe rash involving mucus membranes or skin necrosis: Unknown Has patient had a PCN reaction that required hospitalization:Unknown Has patient had a PCN reaction occurring within the last 10 years: Unknown If all of the above answers are "NO", then may proceed with Cephalosporin use.   . Statins Other (See Comments)    Myopathy, transaminitis  . Sulfamethoxazole-Trimethoprim Itching    Outpatient Encounter Medications as of 08/31/2020  Medication Sig  . guaiFENesin (MUCINEX) 600 MG 12 hr tablet Take 1 tablet  (600 mg total) by mouth 2 (two) times daily for 10 days.  Marland Kitchen acetaminophen (TYLENOL) 325 MG tablet Take 650 mg by mouth every 6 (six) hours as needed (for pain.).  Marland Kitchen amitriptyline (ELAVIL) 50 MG tablet Take 1 tablet (50 mg total) by mouth at bedtime.  Marland Kitchen amLODipine (NORVASC) 10 MG tablet TAKE 1 TABLET(10 MG) BY MOUTH DAILY  . aspirin EC 81 MG tablet Take 81 mg by mouth 2 (two) times a week.  Marland Kitchen azithromycin (ZITHROMAX) 250 MG tablet Take 2 Tablet by mouth x 1 dose today then take one by mouth daily x 4 days  . Chlorphen-Phenyleph-ASA (ALKA-SELTZER PLUS COLD PO) Take by mouth as needed.  . cholecalciferol (VITAMIN D3) 25 MCG (1000 UT) tablet Take 1,000 Units by mouth daily.  Marland Kitchen docusate sodium (COLACE) 100 MG capsule Take 100 mg by mouth daily as needed (constipation.).   Marland Kitchen doxycycline (VIBRA-TABS) 100 MG tablet Take 1 tablet (100 mg total) by mouth 2 (two) times daily.  Marland Kitchen ezetimibe (ZETIA) 10 MG tablet Take 1 tablet (10 mg total) by mouth daily.  . folic acid (FOLVITE) 1 MG tablet TAKE 2 TABLETS BY MOUTH  DAILY  . furosemide (LASIX) 20 MG tablet TAKE 1 TABLET BY MOUTH TWICE DAILY AS NEEDED FOR 3 POUND WEIGHT GAIN IN ONE DAY OR 5 POUNDS IN ONE WEEK  . gabapentin (NEURONTIN) 600 MG tablet TAKE 1 TABLET(600 MG) BY MOUTH THREE TIMES DAILY  . Insulin Lispro (HUMALOG KWIKPEN) 200 UNIT/ML SOPN Inject 20 Units into the skin 3 (three) times daily.  . Insulin Pen Needle (B-D UF III MINI PEN NEEDLES) 31G X 5 MM MISC USE AS DIRECTED  . levothyroxine (SYNTHROID) 125 MCG tablet TAKE 1 TABLET BY MOUTH EVERY DAY 30 MINUTES BEFORE BREAKFAST  . lidocaine (LIDODERM) 5 % Place 1 patch onto the skin daily. Remove & Discard patch within 12 hours or as directed by MD  . lisinopril (ZESTRIL) 20 MG tablet Take one tablet by mouth once daily.  Marland Kitchen loperamide (IMODIUM A-D) 2 MG tablet Take 2 mg by mouth 4 (four) times daily as needed for diarrhea or loose stools.  . magnesium oxide (MAG-OX) 400 MG tablet Take 1 tablet (400 mg  total) by mouth daily as needed (for leg cramps).  . Melatonin 10 MG TABS Take 10 mg by mouth at bedtime.  . metoprolol tartrate (LOPRESSOR) 50 MG tablet Take one tablet by mouth twice daily.  . Multiple Vitamins-Minerals (MULTIVITAMIN ADULT PO) Take 1 tablet by mouth daily.  Marland Kitchen nystatin (NYSTATIN) powder APPLY TOPICALLY THREE TIMES DAILY AS DIRECTED FOR 14 DAYS and prn yeast infection  . nystatin cream (MYCOSTATIN) Apply 1 application topically 2 (two) times daily. To skin folds  . omega-3 acid ethyl esters (LOVAZA) 1 g capsule TAKE ONE CAPSULE BY MOUTH EVERY DAY  . oxyCODONE (ROXICODONE) 5 MG immediate release tablet Take 2 tablets (10 mg total) by mouth 3 (three) times daily as needed for severe pain.  . polyvinyl alcohol (LIQUIFILM TEARS) 1.4 % ophthalmic solution Place 1 drop into both eyes 4 (four) times daily as needed (for dry/irritated eyes).   . potassium chloride (MICRO-K) 10 MEQ CR capsule Take 2 capsules (20 mEq total) by mouth 2 (two) times daily as needed (when taking a dose of lasix.).  Marland Kitchen predniSONE (DELTASONE) 10 MG tablet Take 6 tablets ( 60 mg ) by mouth  x 1 day then  Take 5 tablet ( 50 mg ) by mouth x 1 day then  Take 4 Tablet (40 mg ) by mouth x 1 day then  Take 3 Tablet ( 30 ) by mouth x 1 day then  Take 2 Tablet ( 20 mg ) by mouth x 1 day then  Take 1 Tablet ( 10 Mg )  By mouth x 1 day then stop.  . sodium chloride (TGT SALINE NASAL SPRAY) 0.65 % nasal spray Place 2 sprays into the nose as needed for up to 14 days for congestion.  Marland Kitchen tiotropium (SPIRIVA) 18 MCG inhalation capsule Place 1 capsule (18 mcg total) into inhaler and inhale daily.  Marland Kitchen tiZANidine (ZANAFLEX) 2 MG tablet TAKE 1 TABLET(2 MG) BY MOUTH THREE TIMES DAILY  . TOUJEO SOLOSTAR 300 UNIT/ML Solostar Pen INJECT 100 UNITS INTO THE SKIN EVERY NIGHT AT BEDTIME  . triamterene-hydrochlorothiazide (MAXZIDE-25) 37.5-25 MG tablet TAKE 1 TABLET BY MOUTH  DAILY  . venlafaxine XR (EFFEXOR-XR) 75 MG 24 hr capsule TAKE 1  CAPSULE BY MOUTH EVERY DAY WITH BREAKFAST  . Vibegron (GEMTESA) 75 MG TABS Take 1 tablet by mouth daily.  Marland Kitchen VICTOZA 18 MG/3ML SOPN ADMINISTER 1.8 MG UNDER THE SKIN DAILY FOR BLOOD SUGAR  . Harriette Ohara  5 MG TABS TAKE 1 TABLET BY MOUTH  DAILY   No facility-administered encounter medications on file as of 08/31/2020.    Review of Systems  Constitutional: Negative for chills, fatigue and fever.       Eating soups and drinking a lot of fluids   HENT: Negative for congestion, postnasal drip, sinus pressure, sinus pain, sneezing, sore throat and trouble swallowing.   Respiratory: Positive for cough. Negative for chest tightness, shortness of breath and wheezing.   Cardiovascular: Negative for chest pain and palpitations.       On furosemide for leg swelling.no worsening edema   Gastrointestinal: Negative for abdominal distention, abdominal pain, constipation, diarrhea, nausea and vomiting.  Musculoskeletal: Positive for gait problem. Negative for joint swelling and myalgias.  Skin: Negative for color change, pallor and rash.  Neurological: Negative for dizziness, speech difficulty, weakness, light-headedness, numbness and headaches.  Psychiatric/Behavioral: Negative for agitation, confusion and sleep disturbance. The patient is not nervous/anxious.     Immunization History  Administered Date(s) Administered  . Influenza-Unspecified 06/10/2011  . PFIZER SARS-COV-2 Vaccination 11/13/2019, 11/30/2019  . Pneumococcal Conjugate-13 07/07/2016  . Pneumococcal Polysaccharide-23 11/21/2003, 10/05/2017  . Tdap 05/28/2016   Pertinent  Health Maintenance Due  Topic Date Due  . FOOT EXAM  04/17/2020  . OPHTHALMOLOGY EXAM  10/02/2020  . HEMOGLOBIN A1C  12/10/2020  . DEXA SCAN  09/20/2025  . PNA vac Low Risk Adult  Completed  . INFLUENZA VACCINE  Discontinued   Fall Risk  08/27/2020 08/24/2020 08/24/2020 07/23/2020 06/19/2020  Falls in the past year? 0 0 0 0 0  Number falls in past yr: 0 0 - 0 0  Comment - -  - - -  Injury with Fall? 0 0 - 0 0  Risk for fall due to : - - - - -  Follow up - - - - -   Functional Status Survey:    Vitals:   08/31/20 1345  BP: 140/60  Pulse: 81  Resp: 18  Temp: (!) 97.5 F (36.4 C)  SpO2: 96%  Height: 5\' 6"  (1.676 m)   Body mass index is 37.45 kg/m. Physical Exam Vitals reviewed.  Constitutional:      General: She is not in acute distress.    Appearance: She is obese. She is not ill-appearing.  Eyes:     General: No scleral icterus.       Right eye: No discharge.        Left eye: No discharge.     Extraocular Movements: Extraocular movements intact.     Conjunctiva/sclera: Conjunctivae normal.     Pupils: Pupils are equal, round, and reactive to light.  Cardiovascular:     Rate and Rhythm: Normal rate and regular rhythm.     Pulses: Normal pulses.     Heart sounds: Normal heart sounds.  Pulmonary:     Effort: Pulmonary effort is normal. No respiratory distress.     Breath sounds: No wheezing, rhonchi or rales.     Comments: Bilateral breath sounds diminished clear to bases.No wheezing or rales noted Chest:     Chest wall: No tenderness.  Abdominal:     General: Bowel sounds are normal. There is no distension.     Palpations: Abdomen is soft. There is no mass.     Tenderness: There is no abdominal tenderness. There is no right CVA tenderness, left CVA tenderness, guarding or rebound.  Musculoskeletal:        General: No swelling or tenderness.  Cervical back: Normal range of motion. No rigidity or tenderness.     Right lower leg: No edema.     Left lower leg: No edema.     Comments: Unsteady gait on wheelchair during visit   Lymphadenopathy:     Cervical: No cervical adenopathy.  Skin:    General: Skin is warm and dry.     Coloration: Skin is not pale.     Findings: No bruising, erythema or rash.  Neurological:     Mental Status: She is alert and oriented to person, place, and time.     Cranial Nerves: No cranial nerve deficit.      Motor: No weakness.     Gait: Gait abnormal.  Psychiatric:        Mood and Affect: Mood normal.        Thought Content: Thought content normal.        Judgment: Judgment normal.     Labs reviewed: Recent Labs    09/02/19 0841 01/24/20 1148 06/11/20 0757  NA 141 143 140  K 4.6 4.1 4.2  CL 103 104 100  CO2 31 29 30   GLUCOSE 122* 80 127*  BUN 24 16 21   CREATININE 0.97* 0.84 1.11*  CALCIUM 9.7 9.5 9.9   Recent Labs    01/24/20 1148 06/11/20 0757  AST 15 18  ALT 9 13  BILITOT 0.3 0.4  PROT 6.7 7.1   Recent Labs    09/02/19 0841 01/24/20 1148 06/11/20 0757  WBC 14.8* 16.3* 11.8*  NEUTROABS 10,449* 11,117* 8,520*  HGB 13.9 13.9 14.4  HCT 42.6 42.2 44.8  MCV 83.0 84.6 84.8  PLT 373 368 333   Lab Results  Component Value Date   TSH 0.05 (L) 07/28/2020   Lab Results  Component Value Date   HGBA1C 8.4 (H) 06/11/2020   Lab Results  Component Value Date   CHOL 167 06/11/2020   HDL 40 (L) 06/11/2020   LDLCALC 98 06/11/2020   TRIG 197 (H) 06/11/2020   CHOLHDL 4.2 06/11/2020    Significant Diagnostic Results in last 30 days:  No results found.  Assessment/Plan 1. Symptoms of upper respiratory infection (URI) Afebrile. Recent COVID-19 test inconclusive repeat recommended if still has any symptoms.Previous Influenza A and B were negative. - SARS-COV-2 RNA,(COVID-19) QUAL NAAT - continue on Vitamin C,vit D and Zinc supplement x 14 days.   2. Nonproductive cough COVID-19 test inconclusive repeat recommended. - will add Mucinex for cough  - Encouraged to increase water intake - guaiFENesin (MUCINEX) 600 MG 12 hr tablet; Take 1 tablet (600 mg total) by mouth 2 (two) times daily for 10 days.  Dispense: 20 tablet; Refill: 0 - CBC with Differential/Platelet  Family/ staff Communication: Reviewed plan of care with patient verbalized understanding   Labs/tests ordered: - SARS-COV-2 RNA,(COVID-19) QUAL NAAT  Next Appointment: As needed if symptoms worsen or  fail to improve.   Sandrea Hughs, NP

## 2020-09-03 LAB — SARS-COV-2 RNA,(COVID-19) QUALITATIVE NAAT: SARS CoV2 RNA: NOT DETECTED

## 2020-09-07 ENCOUNTER — Encounter: Payer: Self-pay | Admitting: Internal Medicine

## 2020-09-07 ENCOUNTER — Other Ambulatory Visit: Payer: Self-pay

## 2020-09-07 ENCOUNTER — Telehealth: Payer: Self-pay

## 2020-09-07 ENCOUNTER — Telehealth (INDEPENDENT_AMBULATORY_CARE_PROVIDER_SITE_OTHER): Payer: Medicare Other | Admitting: Internal Medicine

## 2020-09-07 DIAGNOSIS — J441 Chronic obstructive pulmonary disease with (acute) exacerbation: Secondary | ICD-10-CM

## 2020-09-07 DIAGNOSIS — R7989 Other specified abnormal findings of blood chemistry: Secondary | ICD-10-CM

## 2020-09-07 MED ORDER — LEVOFLOXACIN 750 MG PO TABS: 750.0000 mg | ORAL_TABLET | Freq: Every day | ORAL | 0 refills | Status: DC

## 2020-09-07 MED ORDER — IPRATROPIUM-ALBUTEROL 0.5-2.5 (3) MG/3ML IN SOLN: 3.0000 mL | Freq: Four times a day (QID) | RESPIRATORY_TRACT | 1 refills | Status: DC | PRN

## 2020-09-07 NOTE — Telephone Encounter (Signed)
Ms. merrick, feutz are scheduled for a virtual visit with your provider today.    Just as we do with appointments in the office, we must obtain your consent to participate.  Your consent will be active for this visit and any virtual visit you may have with one of our providers in the next 365 days.    If you have a MyChart account, I can also send a copy of this consent to you electronically.  All virtual visits are billed to your insurance company just like a traditional visit in the office.  As this is a virtual visit, video technology does not allow for your provider to perform a traditional examination.  This may limit your provider's ability to fully assess your condition.  If your provider identifies any concerns that need to be evaluated in person or the need to arrange testing such as labs, EKG, etc, we will make arrangements to do so.    Although advances in technology are sophisticated, we cannot ensure that it will always work on either your end or our end.  If the connection with a video visit is poor, we may have to switch to a telephone visit.  With either a video or telephone visit, we are not always able to ensure that we have a secure connection.   I need to obtain your verbal consent now.   Are you willing to proceed with your visit today?   MILIANA GANGWER has provided verbal consent on 09/07/2020 for a virtual visit (video or telephone).   [place], [place] 09/07/2020  1:30 PM

## 2020-09-07 NOTE — Progress Notes (Signed)
This service is provided via telemedicine  No vital signs collected/recorded due to the encounter was a telemedicine visit.   Location of patient (ex: home, work): Home.  Patient consents to a telephone visit: Yes.  Location of the provider (ex: office, home): Remote.  Name of any referring provider: Gayland Curry, DO   Names of all persons participating in the telemedicine service and their role in the encounter: Patient, Katherine Delgado, [place], Hollace Kinnier, DO.  Time spent on call: 8 minutes spent on the phone with Medical Assistant.    Virtual Visit via Video Note  I connected with Katherine Delgado on 09/07/20 at  1:30 PM EST by a video enabled telemedicine application and verified that I am speaking with the correct person using two identifiers.  Location: Patient: Her home Provider: My home   I discussed the limitations of evaluation and management by telemedicine and the availability of in person appointments. The patient expressed understanding and agreed to proceed.  History of Present Illness: 80 yo female with diabetes, fibromyalgia, RA, failed back syndrome, neuropathy, chf seen virtually via video for continued upper respiratory symptoms.  She had a visit with NP Ngetich on 1/10 due to cough and URI symptoms.  She'd also been seen 1/6.  Flu tests were negative.  Her covid test initially was inconclusive and a second test recommended.  She was treated with a 10 day course of doxy before that that was started 1/3.  Then she was changed to a zpak and prednisone taper due to wheezing 1/6.  She also was taking vitamin D, C and zinc and using her regular inhalers as directed.  She's requested another abx and prednisone.  She had no fever, chills, chest pain or palpitation, but did have tightness and could not cough up her sputum.  So, repeat covid test was done on 1/10 and she was encouraged to take mucinex bid for 10 days and continue the vitamins, push fluids.   Her second  covid test was negative.  CBC did show elevation of her WBC count even from her elevated baseline, but she'd been on prednisone.  Today, she reports being "no better" with chest and head congestion.  She's now been sick for at least 2 weeks.  It's just hanging on.  Tried mucinex liquid.  Using spray for her throat.  Will not turn loose or get coughed up.  Chest and sides hurt from coughing so much.  The little she gets up is green and infected--same in her head.  The improvement stopped when she finished the zpak.  She has not had a chest xray.    She has not had her covid booster yet.    Observations/Objective: Video did not work for [place]. T99 at times and other times normal Glucose back to baseline--126 in am  Assessment and Plan: 1. COPD exacerbation (HCC) - not improving with bronchitis and sinusitis abx and not able to expectorate mucus -will get her a nebulizer which she should have anyway and neb txs ordered--machine Rx will print out and will need to be sent to [facility]  -tx for pneumonia, but ideally should have CXR if she can get out to do that tomorrow - For home use only DME Nebulizer machine - ipratropium-albuterol (DUONEB) 0.5-2.5 (3) MG/3ML SOLN; Take 3 mLs by nebulization every 6 (six) hours as needed.  Dispense: 360 mL; Refill: 1 - levofloxacin (LEVAQUIN) 750 MG tablet; Take 1 tablet (750 mg total) by mouth  daily.  Dispense: 7 tablet; Refill: 0 - DG Chest 2 View  Follow Up Instructions:  Get CXR and take abx and nebs as directed  I discussed the assessment and treatment plan with the patient. The patient was provided an opportunity to ask questions and all were answered. The patient agreed with the plan and demonstrated an understanding of the instructions.   The patient was advised to call back or seek an in-person evaluation if the symptoms worsen or if the condition fails to improve as anticipated.  I provided 20 minutes of non-face-to-face time during this  encounter.   Hollace Kinnier, DO

## 2020-09-08 DIAGNOSIS — J449 Chronic obstructive pulmonary disease, unspecified: Secondary | ICD-10-CM | POA: Diagnosis not present

## 2020-09-09 ENCOUNTER — Other Ambulatory Visit: Payer: Self-pay

## 2020-09-09 DIAGNOSIS — R7989 Other specified abnormal findings of blood chemistry: Secondary | ICD-10-CM

## 2020-09-10 ENCOUNTER — Other Ambulatory Visit: Payer: Self-pay

## 2020-09-10 ENCOUNTER — Ambulatory Visit
Admission: RE | Admit: 2020-09-10 | Discharge: 2020-09-10 | Disposition: A | Discharge status: Home / Self Care | Payer: Medicare Other | Source: Ambulatory Visit | Attending: Internal Medicine | Admitting: Internal Medicine

## 2020-09-10 ENCOUNTER — Other Ambulatory Visit: Payer: Medicare Other

## 2020-09-10 DIAGNOSIS — J449 Chronic obstructive pulmonary disease, unspecified: Secondary | ICD-10-CM | POA: Diagnosis not present

## 2020-09-10 DIAGNOSIS — R7989 Other specified abnormal findings of blood chemistry: Secondary | ICD-10-CM | POA: Diagnosis not present

## 2020-09-10 LAB — CBC WITH DIFFERENTIAL/PLATELET
Absolute Monocytes: 1260 cells/uL — ABNORMAL HIGH (ref 200–950)
Basophils Absolute: 105 cells/uL (ref 0–200)
Basophils Relative: 0.7 %
Eosinophils Absolute: 540 cells/uL — ABNORMAL HIGH (ref 15–500)
Eosinophils Relative: 3.6 %
HCT: 41.8 % (ref 35.0–45.0)
Hemoglobin: 13.7 g/dL (ref 11.7–15.5)
Lymphs Abs: 1170 cells/uL (ref 850–3900)
MCH: 27.7 pg (ref 27.0–33.0)
MCHC: 32.8 g/dL (ref 32.0–36.0)
MCV: 84.6 fL (ref 80.0–100.0)
MPV: 11.7 fL (ref 7.5–12.5)
Monocytes Relative: 8.4 %
Neutro Abs: 11925 cells/uL — ABNORMAL HIGH (ref 1500–7800)
Neutrophils Relative %: 79.5 %
Platelets: 334 10*3/uL (ref 140–400)
RBC: 4.94 10*6/uL (ref 3.80–5.10)
RDW: 12.4 % (ref 11.0–15.0)
Total Lymphocyte: 7.8 %
WBC: 15 10*3/uL — ABNORMAL HIGH (ref 3.8–10.8)

## 2020-09-11 ENCOUNTER — Other Ambulatory Visit: Payer: Self-pay | Admitting: Internal Medicine

## 2020-09-11 NOTE — Telephone Encounter (Signed)
Patient has request a refill on medication "Venlafaxine 75 mg". Patient last refill was 03/16/2020 with 90 tablets to be taken once daily with breakfast. Patient had 1 additional refill but is due for another. Patient medication has warning. Medication pend and sent to Marlowe Sax, NP due to PCP Gayland Curry, DO . Being out of office. Please Advise.

## 2020-09-24 ENCOUNTER — Telehealth: Payer: Self-pay | Admitting: Physical Medicine and Rehabilitation

## 2020-09-24 NOTE — Telephone Encounter (Signed)
Patient is calling because her medications will run out before she comes back to see Dr. Ranell Patrick, which is on 10/04/20.  Please let patient know if these can be filled.

## 2020-09-25 ENCOUNTER — Other Ambulatory Visit: Payer: Self-pay | Admitting: Physical Medicine and Rehabilitation

## 2020-09-25 MED ORDER — OXYCODONE HCL 5 MG PO TABS: 10.0000 mg | ORAL_TABLET | Freq: Three times a day (TID) | ORAL | 0 refills | Status: DC | PRN

## 2020-09-25 NOTE — Telephone Encounter (Signed)
I have sent her refill!

## 2020-09-25 NOTE — Telephone Encounter (Signed)
Notified. 

## 2020-09-27 ENCOUNTER — Other Ambulatory Visit: Payer: Self-pay | Admitting: Rheumatology

## 2020-09-28 NOTE — Telephone Encounter (Signed)
Last Visit: 06/25/2020 Next Visit: 10/22/2020 Labs: 1/202/2022, WBC 15.0, Neutro Abs 11,925, Absolute Monocytes 1,260, Eosinophils Absolute 540, CMP 10 21/2021, Glucose 127, Creat 1.11, GRF 47 TB Gold: 06/11/2020, negative  Current Dose per office note 06/25/2020,  Morrie Sheldon 5 mg 1 tablet daily DX: Rheumatoid arthritis involving multiple sites with positive rheumatoid factor   Okay to refill Morrie Sheldon?

## 2020-10-01 ENCOUNTER — Encounter
Payer: Medicare Other | Attending: Physical Medicine and Rehabilitation | Admitting: Physical Medicine and Rehabilitation

## 2020-10-01 ENCOUNTER — Encounter: Payer: Self-pay | Admitting: Physical Medicine and Rehabilitation

## 2020-10-01 ENCOUNTER — Other Ambulatory Visit: Payer: Self-pay

## 2020-10-01 VITALS — BP 147/77 | HR 82 | Temp 99.1°F | Ht 66.0 in | Wt 236.4 lb

## 2020-10-01 DIAGNOSIS — Z79891 Long term (current) use of opiate analgesic: Secondary | ICD-10-CM | POA: Diagnosis not present

## 2020-10-01 DIAGNOSIS — Z5181 Encounter for therapeutic drug level monitoring: Secondary | ICD-10-CM | POA: Diagnosis not present

## 2020-10-01 DIAGNOSIS — M170 Bilateral primary osteoarthritis of knee: Secondary | ICD-10-CM | POA: Diagnosis present

## 2020-10-01 DIAGNOSIS — E1142 Type 2 diabetes mellitus with diabetic polyneuropathy: Secondary | ICD-10-CM

## 2020-10-01 DIAGNOSIS — M7918 Myalgia, other site: Secondary | ICD-10-CM

## 2020-10-01 DIAGNOSIS — G894 Chronic pain syndrome: Secondary | ICD-10-CM

## 2020-10-01 DIAGNOSIS — M160 Bilateral primary osteoarthritis of hip: Secondary | ICD-10-CM

## 2020-10-01 MED ORDER — OXYCODONE HCL 5 MG PO TABS: 10.0000 mg | ORAL_TABLET | Freq: Three times a day (TID) | ORAL | 0 refills | Status: DC | PRN

## 2020-10-01 MED ORDER — LIDOCAINE 5 % EX PTCH: 1.0000 | MEDICATED_PATCH | CUTANEOUS | 0 refills | Status: DC

## 2020-10-01 NOTE — Patient Instructions (Signed)
.  Turmeric to reduce inflammation--can be used in cooking or taken as a supplement.  Benefits of turmeric:  -Highly anti-inflammatory  -Increases antioxidants  -Improves memory, attention, brain disease  -Lowers risk of heart disease  -May help prevent cancer  -Decreases pain  -Alleviates depression  -Delays aging and decreases risk of chronic disease  -Consume with black pepper to increase absorption    Turmeric Milk Recipe:  1 cup milk  1 tsp turmeric  1 tsp cinnamon  1 tsp grated ginger (optional)  Black pepper (boosts the anti-inflammatory properties of turmeric).  1 tsp honey 

## 2020-10-01 NOTE — Progress Notes (Signed)
Subjective:    Patient ID: Katherine Delgado, female    DOB: [birth year], 80 y.o.   MRN: [id number]  HPI  Katherine Delgado is a 80 year old woman who presents for follow-up of her bilateral knee OA, hip OA, lower back pain, constipation.  1) Congestion: She is having cold symptoms, head congestion. She has Vicks cough and cold. She has been drinking liquids.   2) Bilateral knee pain: She has been using Oxycodone 5mg  tablets twice 3 times per day- she has been trying to take it twice per day. Sometimes she needs a 4th at night. She asks about the prednisone we had discussed last visit. She would like to try the viscosupplementation next visit. She has had this in the past with temporary relief. She has not had any recent imaging of her knees.   3) Insomnia: She has been taking Amitriptyline 25mg  which provides her with about three hours of sleep.The Amitriptyline has been helping. She does not get time outside during the day.   4) Constipation: well controlled with medication. She is having a BM every day.   5) Type 2 DM: Her sugars have been well controlled recently.   Prior history: She has been taking three Norco per day and finds this helps but wanted to see if she can take anything stronger. She has pain in her back as well as throughout her joints due to her autoimmune conditions. She feels that physically she is getting worse in term of her conditioning. She does not take steroid currently. She has taken Prednisone in the past with good benefit. Her son is also present for the appointment. A side effect she has had with prednisone is elevated blood sugar- she is already on several medications for diabetes.   Prior history:  Pain is constant. Medication helps but it does not go away. Pain can be in arms, leg, chest. It tends to be worst in back, hips, and legs. Discussed trigger point injections which she would like to try.   She has never tried blue emu oil. She has tried Lidocaine patches in  the past.   She is almost never walking due to her pain. She is not doing much walking at home either. She can only stand 5 minutes before she has to sit down.   Prior history:  Her cold symptoms are getting better but she did not spread germs to our office members.   Her pain has worsened as a result of her cold, but she does feel that the medication is helping. She would like to maintain the same dose.  She has been eating less and drinking more water and as a result has lost weight and her CBGs are better controlled.  Constipation has been well controlled with Colace.   Her pain has been quite severe. She is feeling better. Her average pain is 8/10. She has been to do a little bit more around the house. She is able to fold clothes when her son brings them in. She has not been able to walk much more. She does walk within the house, to go to the bathroom. She is limited by the knee pain and the radiating back pain, as well as her decreased strength. She has never had an epidural injection into the back.   Discussed the results of her lumbar MRI with her: Abnormal MRI lumbar spine (without) demonstrating: 1. At L4-5: disc bulging, disc protrusion, facet and ligamentum flavum hypertrophy, resulting in severe spinal stenosis  and severe biforaminal stenosis. Also degenerative endplate and marrow edema at L4-5 with STIR hyperintense signal. 2. At L3-4: disc bulging, facet hypertrophy with moderate-severe spinal stenosis and severe biforaminal stenosis. 3. At L5-S1: loss of disc space height, facet hypertrophy, left hemi-laminectomy, with no spinal stenosis or foraminal narrowing. 4. Compared to MRI on 10/19/12, there has been significant worsening of degenerative spine disease, especially at L4-5 level.  She had surgery on November 12th, 2019. This did help her and she wen through rehab afterward. But now she feels worse than before the surgery.    Pain Inventory Average Pain 8 Pain Right Now  7 My pain is constant, sharp, burning, stabbing, tingling and aching  In the last 24 hours, has pain interfered with the following? General activity 9 Relation with others 8  Enjoyment of life 9 What TIME of day is your pain at its worst? morning , evening and night Sleep (in general) Poor  Pain is worse with: walking, sitting, inactivity, standing and some activites Pain improves with: rest and medication Relief from Meds: 6  Family History  Problem Relation Age of Onset  . Alzheimer's disease Mother   . Heart disease Mother   . Heart disease Father   . Liver disease Father   . Cancer Brother   . Arthritis Son   . Colon polyps Brother   . Colon cancer Neg Hx   . Esophageal cancer Neg Hx   . Kidney disease Neg Hx   . Stomach cancer Neg Hx   . Rectal cancer Neg Hx    Social History   Socioeconomic History  . Marital status: Widowed    Spouse name: Not on file  . Number of children: 2  . Years of education: 21  . Highest education level: Not on file  Occupational History  . Occupation: Retired  Tobacco Use  . Smoking status: Former Smoker    Packs/day: 0.10    Years: 10.00    Pack years: 1.00    Types: Cigarettes    Quit date: 12/07/1979    Years since quitting: 40.8  . Smokeless tobacco: Never Used  Vaping Use  . Vaping Use: Never used  Substance and Sexual Activity  . Alcohol use: No    Alcohol/week: 0.0 standard drinks  . Drug use: No  . Sexual activity: Not Currently  Other Topics Concern  . Not on file  Social History Narrative   Walks with cane   Right handed    Caffeine use: Coffee (2 cups every morning)   Tea: sometimes   Soda: none   Social Determinants of Radio broadcast assistant Strain: Not on file  Food Insecurity: Not on file  Transportation Needs: Not on file  Physical Activity: Not on file  Stress: Not on file  Social Connections: Not on file   Past Surgical History:  Procedure Laterality Date  . ABDOMINAL HYSTERECTOMY  1974   . abdominal tumor  2002  . APPENDECTOMY    . APPLICATION OF A-CELL OF BACK N/A 09/26/2018   Procedure: With Acell;  Surgeon: Wallace Going, DO;  Location: [facility];  Service: Plastics;  Laterality: N/A;  . APPLICATION OF WOUND VAC N/A 09/26/2018   Procedure: Vac placement;  Surgeon: Wallace Going, DO;  Location: [facility];  Service: Plastics;  Laterality: N/A;  . [place]  . BRONCHOSCOPY  2001  . CATARACT EXTRACTION, BILATERAL  09/2019, 10/2019  . CHOLECYSTECTOMY  1984  . INCISION AND DRAINAGE  OF WOUND N/A 09/26/2018   Procedure: Debridement of spine wound;  Surgeon: Wallace Going, DO;  Location: [facility];  Service: Plastics;  Laterality: N/A;  . KNEE SURGERY Bilateral 08/27/2010 (L) and 01/11/2011 (R)  . LUMBAR LAMINECTOMY/DECOMPRESSION MICRODISCECTOMY N/A 07/03/2018   Procedure: Lumbar Three to Lumbar Five Laminectomy;  Surgeon: Judith Part, MD;  Location: [facility];  Service: Neurosurgery;  Laterality: N/A;  . LUMBAR WOUND DEBRIDEMENT N/A 08/20/2018   Procedure: POSTERIOR LUMBAR SPINAL WOUND DEBRIDEMENT AND REVISION;  Surgeon: Judith Part, MD;  Location: [facility];  Service: Neurosurgery;  Laterality: N/A;  POSTERIOR LUMBAR SPINAL WOUND REVISION  . [place]  . OVARIAN CYST SURGERY  1968  . thymus tumor    . 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 TONSILLECTOMY     Past Surgical History:  Procedure Laterality Date  . ABDOMINAL HYSTERECTOMY  1974  . abdominal tumor  2002  . APPENDECTOMY    . APPLICATION OF A-CELL OF BACK N/A 09/26/2018   Procedure: With Acell;  Surgeon: Wallace Going, DO;  Location: [facility];  Service: Plastics;  Laterality: N/A;  . APPLICATION OF WOUND VAC N/A 09/26/2018   Procedure: Vac placement;  Surgeon: Wallace Going, DO;  Location: [facility];  Service: Plastics;  Laterality: N/A;  . [place]  . BRONCHOSCOPY  2001  . CATARACT EXTRACTION, BILATERAL  09/2019, 10/2019  . CHOLECYSTECTOMY  1984  . INCISION AND DRAINAGE OF WOUND N/A  09/26/2018   Procedure: Debridement of spine wound;  Surgeon: Wallace Going, DO;  Location: [facility];  Service: Plastics;  Laterality: N/A;  . KNEE SURGERY Bilateral 08/27/2010 (L) and 01/11/2011 (R)  . LUMBAR LAMINECTOMY/DECOMPRESSION MICRODISCECTOMY N/A 07/03/2018   Procedure: Lumbar Three to Lumbar Five Laminectomy;  Surgeon: Judith Part, MD;  Location: [facility];  Service: Neurosurgery;  Laterality: N/A;  . LUMBAR WOUND DEBRIDEMENT N/A 08/20/2018   Procedure: POSTERIOR LUMBAR SPINAL WOUND DEBRIDEMENT AND REVISION;  Surgeon: Judith Part, MD;  Location: [facility];  Service: Neurosurgery;  Laterality: N/A;  POSTERIOR LUMBAR SPINAL WOUND REVISION  . [place]  . OVARIAN CYST SURGERY  1968  . thymus tumor    . 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 TONSILLECTOMY     Past Medical History:  Diagnosis Date  . Abnormality of gait 04/19/2016  . Acute infective polyneuritis ([place]) 2002  . Allergic rhinitis due to pollen   . Chronic pain syndrome   . COPD (chronic obstructive pulmonary disease) (HCC)    chronic bronchitis  . Depressive disorder, not elsewhere classified   . Diabetes mellitus without complication ([place])   . Diaphragmatic hernia without mention of obstruction or gangrene   . Dyslipidemia   . Fibromyalgia   . GERD (gastroesophageal reflux disease)    with h/o esophagitis  . Guillain-Barre syndrome ([place])   . History of benign thymus tumor   . Insomnia, unspecified   . Lumbar spinal stenosis 03/30/2018   L4-5 level, severe  . Miscarriage 1962  . Mixed hyperlipidemia   . Morbid obesity ([place])   . Osteoporosis   . Pneumonia   . Polyneuropathy in diabetes(357.2)   . Rheumatoid arthritis(714.0)   . Spondylosis, lumbosacral   . Spontaneous ecchymoses   . Type II or unspecified type diabetes mellitus with peripheral circulatory disorders, uncontrolled(250.72)   . Unspecified chronic bronchitis ([place])   . Unspecified essential hypertension   . Unspecified hypothyroidism   .  Unspecified pruritic disorder   .  Unspecified urinary incontinence    There were no vitals taken for this visit.  Opioid Risk Score:   Fall Risk Score:  `1  Depression screen PHQ 2/9  Depression screen [facility] 2/9 07/23/2020 06/19/2020 06/15/2020 03/12/2020 02/19/2020 02/10/2020 02/05/2020  Decreased Interest 0 0 0 0 0 0 3  Down, Depressed, Hopeless 0 0 0 0 0 0 0  PHQ - 2 Score 0 0 0 0 0 0 3  Altered sleeping - - - - 0 - 3  Tired, decreased energy - - - - 0 - 2  Change in appetite - - - - 0 - 2  Feeling bad or failure about yourself  - - - - 0 - 0  Trouble concentrating - - - - 0 - 1  Moving slowly or fidgety/restless - - - - 0 - 0  Suicidal thoughts - - - - 0 - 0  PHQ-9 Score - - - - 0 - 11  Difficult doing work/chores - - - - - - Extremely dIfficult  Some recent data might be hidden   Review of Systems  Constitutional: Positive for fatigue.  HENT: Negative.   Eyes: Negative.   Respiratory: Negative.   Cardiovascular: Positive for leg swelling.  Gastrointestinal: Negative.   Endocrine: Negative.   Genitourinary: Negative.   Musculoskeletal: Positive for arthralgias, back pain and gait problem.  Skin: Negative.   Allergic/Immunologic: Negative.   Neurological: Positive for weakness and numbness.       Tingling  Hematological: Negative.   Psychiatric/Behavioral: Negative.        Objective:   Physical Exam Gen: no distress, normal appearing, BMI 38.2, son accompanies.  HEENT: oral mucosa pink and moist, NCAT, hard of hearing.  Cardio: Reg rate Chest: normal effort, normal rate of breathing Abd: soft, non-distended Ext: no edema Psych: pleasant, normal affect Skin: intact Neuro: Alert and oriented x3 Musculoskeletal: Multiple myofascial trigger points throughout lumbar and cervical paraspinals. Tenderness to palpation in bilateral knees along medial and lateral joint lines. Requires wheelchair transport. Tenderness to palpation in bilateral hips.     Assessment & Plan:   Mrs. Cales is a 80 year old woman who presents with chronic pain syndrome, failed back syndrome, lumbar stenosis, bilateral knee osteoarthritis, bilateral hip OA, severe insomnia, and constipation.   1) Severe insomnia: Amitriptyline is helping. No side effects. Currently taking 50mg  and this has been working well for her. Has had no falls. Discussed the side effect of wooziness and increased fall risk when taking this medication. -Discussed the benefits of time outdoors as early in the day as possible to help reset her circadian rhythm.   2) Chronic pain syndrome secondary to lumbar spinal stenosis, failed back syndrome, and bilateral knee osteoarthritis that no longer benefits from injections.  Pain contract previously signed and UDS obtained. Discussed role of exercise and anti-inflammatory diet in pain relief. Recommended exercise bike given limited mobility and low carb diet. Reviewed the results of MRI with her (as in HPI). She has made good dietary changes. The medication has been helping. I have prescribed Lidocaine patches to use daily, blue emu oil OTC. Will try trigger point injections for myofascial pain next visit. -Has been taking Oxycodone approximately 2-3 times per day. Commended her on this decrease! Amitriptyline as above to help with sleep and pain and to minimize use of an additional oxycodone at night. -Her pain is worst at night.  -Obtain bilateral XRs, I will call with results -Plan for viscosupplementation next visit.She has failed months of  conservative care: physical therapy with home exercise program, weight loss efforts, anti-inflammatory medications, opioid medications. Patient has joint pain, swelling, stiffness, instability, locking, and difficulty with ambulation.   Turmeric to reduce inflammation--can be used in cooking or taken as a supplement.  Benefits of turmeric:  -Highly anti-inflammatory  -Increases antioxidants  -Improves memory, attention, brain  disease  -Lowers risk of heart disease  -May help prevent cancer  -Decreases pain  -Alleviates depression  -Delays aging and decreases risk of chronic disease  -Consume with black pepper to increase absorption   Turmeric Milk Recipe:  1 cup milk  1 tsp turmeric  1 tsp cinnamon  1 tsp grated ginger (optional)  Black pepper (boosts the anti-inflammatory properties of turmeric).  1 tsp honey   3) Obesity class 2: Monitor each visit. Can contribute to chronic disease and worsening pain. Lifestyle changes as above. Weight today is 236 lbs, this is down 10 lbs in the past two months. Commended on this.   4) Incontinence of bladder. Discussed timed voiding q2H. Cranberry supplements won't hurt, but help more for UTIs. Can continue Myrbetriq. Discussed that constipation could be a factor.   5) Congestive heart failure: educated that purpose of Lasix is to treat CHF and Myrbetric to treat urinary retention. Advised to follow-up with Dr. Mariea Clonts given her increased weight since last visit, which she attributes to fluid retention. She may benefit from a higher dose of Lasix.   6) Constipation:  Continue colace which is helping constipation.   7) Type 2 DM: Commended on progress! Glucose has been stable- she has been eating less sugar and drinking more water. Discussed how steroids can increase CBGs  8) Impaired mobility and ADLs.: Encouraged to get out of the wheelchair every hour to increase blood flow, maintain muscle strength, and to prevent pressure injury. Discussed home therapies.  -Her mobility continues to be quite impaired. She would like to be able to get out of her wheelchair more frequently.   9) Autoimmune conditions: -Discussed that steroids can be very helpful for this. Discussed the side effects of steroids. Will consider next visit as not recommended during an active infection given suppressed immune system  10) Viral upper respiratory infection: She has contacted  her PCP. Continue Mucinex, cough syrup as needed. Recommended hot water with honey, lime, and ginger. Afebrile.   11) Bilateral hip OA: XRs ordered. Discussed risks and benefits of steroids injections.  12) Myofascial pain, cervical and lumbar: Trigger Point Injection performed today as follows:   Indication: Myofascial pain not relieved by medication management and other conservative care.  Informed consent was obtained after describing risk and benefits of the procedure with the patient, this includes bleeding, bruising, infection and medication side effects.  The patient wishes to proceed and has given written consent.  The patient was placed in a seated position.  The myofascial areas were marked and prepped with Betadine.  It was entered with a 25-gauge 1/2 inch needle and a total of 5 mL of 1% lidocaine was injected into a total of 2 trigger points, after negative draw back for blood.  The patient tolerated the procedure well.  Post procedure instructions were given.

## 2020-10-05 LAB — DRUG TOX MONITOR 1 W/CONF, ORAL FLD
Amphetamines: NEGATIVE ng/mL (ref <10)
Barbiturates: NEGATIVE ng/mL (ref <10)
Benzodiazepines: NEGATIVE ng/mL (ref <0.50)
Buprenorphine: NEGATIVE ng/mL (ref <0.10)
Cocaine: NEGATIVE ng/mL (ref <5.0)
Codeine: NEGATIVE ng/mL (ref <2.5)
Dihydrocodeine: NEGATIVE ng/mL (ref <2.5)
Fentanyl: NEGATIVE ng/mL (ref <0.10)
Heroin Metabolite: NEGATIVE ng/mL (ref <1.0)
Hydrocodone: NEGATIVE ng/mL (ref <2.5)
Hydromorphone: NEGATIVE ng/mL (ref <2.5)
MARIJUANA: NEGATIVE ng/mL (ref <2.5)
MDMA: NEGATIVE ng/mL (ref <10)
Meprobamate: NEGATIVE ng/mL (ref <2.5)
Methadone: NEGATIVE ng/mL (ref <5.0)
Morphine: NEGATIVE ng/mL (ref <2.5)
Nicotine Metabolite: NEGATIVE ng/mL (ref <5.0)
Norhydrocodone: NEGATIVE ng/mL (ref <2.5)
Noroxycodone: 13.3 ng/mL — ABNORMAL HIGH (ref <2.5)
Opiates: POSITIVE ng/mL — AB (ref <2.5)
Oxycodone: 81.1 ng/mL — ABNORMAL HIGH (ref <2.5)
Oxymorphone: NEGATIVE ng/mL (ref <2.5)
Phencyclidine: NEGATIVE ng/mL (ref <10)
Tapentadol: NEGATIVE ng/mL (ref <5.0)
Tramadol: NEGATIVE ng/mL (ref <5.0)
Tramadol: NEGATIVE ng/mL (ref <5.0)
Zolpidem: NEGATIVE ng/mL (ref <5.0)

## 2020-10-05 LAB — DRUG TOX ALC METAB W/CON, ORAL FLD: Alcohol Metabolite: NEGATIVE ng/mL (ref <25)

## 2020-10-07 ENCOUNTER — Telehealth: Payer: Self-pay | Admitting: *Deleted

## 2020-10-07 NOTE — Telephone Encounter (Signed)
Oral swab drug screen was consistent for prescribed medications.  ?

## 2020-10-09 NOTE — Progress Notes (Signed)
Office Visit Note  Patient: Katherine Delgado             Date of Birth: March 05, 1941           MRN: [id number]             PCP: Gayland Curry, DO Referring: Gayland Curry, DO Visit Date: 10/22/2020 Occupation: @GUAROCC @  Subjective:  generalized pain  History of Present Illness: SHANEESE Delgado is a 80 y.o. female with history of seropositive rheumatoid arthritis, Sjogren's syndrome, osteoarthritis, DDD, and osteoporosis.  Patient is currently on Xeljanz 5 mg 1 tablet by mouth daily.  She continues to tolerate Morrie Sheldon without any side effects.  According to the patient she had a recent upper respiratory tract infection and held Morrie Sheldon for about 2 weeks.  She resumed Morrie Sheldon about 1 week ago and has not had any recurrence of symptoms.  She states that while off of Morrie Sheldon she was experiencing increased pain in both hands and both knee joints.  She continues to experience generalized myalgias and muscle tenderness due to underlying fibromyalgia.  She has chronic fatigue secondary to insomnia.  She has been experiencing nocturnal pain which has been contributing to her insomnia as well.  She continues to follow-up with pain management for chronic lower back pain.  She uses Lidoderm patches as needed for pain relief and continues to take oxycodone as needed.  According to the patient her PCP orders her bone densities.  She thinks her last bone density was in June 2021.  She has been taking vitamin D 1000 units daily.    Activities of Daily Living:  Patient reports joint stiffness all day  Patient Reports nocturnal pain.  Difficulty dressing/grooming: Reports Difficulty climbing stairs: Reports Difficulty getting out of chair: Reports Difficulty using hands for taps, buttons, cutlery, and/or writing: Reports  Review of Systems  Constitutional: Positive for fatigue.  HENT: Positive for mouth dryness. Negative for mouth sores and nose dryness.   Eyes: Positive for dryness. Negative for pain and  visual disturbance.  Respiratory: Negative for cough, hemoptysis and difficulty breathing.   Cardiovascular: Positive for swelling in legs/feet. Negative for chest pain, palpitations and hypertension.  Gastrointestinal: Positive for constipation and diarrhea. Negative for blood in stool.  Endocrine: Positive for increased urination.  Genitourinary: Positive for involuntary urination. Negative for painful urination.  Musculoskeletal: Positive for arthralgias, gait problem, joint pain, joint swelling, muscle weakness, morning stiffness and muscle tenderness. Negative for myalgias and myalgias.  Skin: Positive for rash. Negative for color change, pallor, hair loss, nodules/bumps, skin tightness, ulcers and sensitivity to sunlight.  Neurological: Positive for numbness. Negative for dizziness and headaches.  Hematological: Negative for bruising/bleeding tendency and swollen glands.  Psychiatric/Behavioral: Positive for sleep disturbance. Negative for depressed mood. The patient is not nervous/anxious.     PMFS History:  Patient Active Problem List   Diagnosis Date Noted  . 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 Primary osteoarthritis of right knee 07/25/2019  . Wound dehiscence 08/17/2018  . Lumbar spinal stenosis 03/30/2018  . Rheumatoid arthritis involving both wrists with positive rheumatoid factor ([place]) 06/14/2016  . High risk medication use 06/14/2016  . Sjogren's syndrome ([place]) 06/14/2016  . Primary osteoarthritis of both knees 06/14/2016  . DDD (degenerative disc disease), lumbar 06/14/2016  . Hypothyroidism 06/14/2016  . Osteoporosis 06/14/2016  . Abnormality of gait 04/19/2016  . Type 2 diabetes mellitus with diabetic polyneuropathy, with long-term current use of insulin ([place]) 11/27/2015  . Diabetes mellitus  without complication ([place]) 95/18/8416  . Headache(784.0) 07/31/2013  . Sinus infection 07/31/2013  . Chronic right-sided low back pain with right-sided sciatica  03/14/2013  . Insomnia 12/06/2012  . Nausea alone 12/06/2012  . Diarrhea 12/06/2012  . Urinary incontinence, urge 12/06/2012  . Lumbosacral root lesions, not elsewhere classified 11/27/2012  . Diabetic polyneuropathy associated with type 2 diabetes mellitus ([place]) 11/27/2012  . EDEMA 05/04/2010  . YEAST INFECTION 05/03/2010  . Hyperlipidemia 05/03/2010  . DEPRESSION 05/03/2010  . History of peripheral neuropathy 05/03/2010  . Essential hypertension 05/03/2010  . ALLERGIC RHINITIS 05/03/2010  . PNEUMONIA 05/03/2010  . COPD (chronic obstructive pulmonary disease) ([place]) 05/03/2010  . GERD 05/03/2010  . Fibromyalgia 05/03/2010  . DYSPNEA 05/03/2010  . CHEST PAIN 05/03/2010    Past Medical History:  Diagnosis Date  . Abnormality of gait 04/19/2016  . Acute infective polyneuritis ([place]) 2002  . Allergic rhinitis due to pollen   . Chronic pain syndrome   . COPD (chronic obstructive pulmonary disease) ([facility])    chronic bronchitis  . Depressive disorder, not elsewhere classified   . Diabetes mellitus without complication ([place])   . Diaphragmatic hernia without mention of obstruction or gangrene   . Dyslipidemia   . Fibromyalgia   . GERD (gastroesophageal reflux disease)    with h/o esophagitis  . Guillain-Barre syndrome ([place])   . History of benign thymus tumor   . Insomnia, unspecified   . Lumbar spinal stenosis 03/30/2018   L4-5 level, severe  . Miscarriage 1962  . Mixed hyperlipidemia   . Morbid obesity ([place])   . Osteoporosis   . Pneumonia   . Polyneuropathy in diabetes(357.2)   . Rheumatoid arthritis(714.0)   . Spondylosis, lumbosacral   . Spontaneous ecchymoses   . Type II or unspecified type diabetes mellitus with peripheral circulatory disorders, uncontrolled(250.72)   . Unspecified chronic bronchitis ([place])   . Unspecified essential hypertension   . Unspecified hypothyroidism   . Unspecified pruritic disorder   . Unspecified urinary incontinence     Family History   Problem Relation Age of Onset  . Alzheimer's disease Mother   . Heart disease Mother   . Heart disease Father   . Liver disease Father   . Cancer Brother   . Arthritis Son   . Colon polyps Brother   . Colon cancer Neg Hx   . Esophageal cancer Neg Hx   . Kidney disease Neg Hx   . Stomach cancer Neg Hx   . Rectal cancer Neg Hx    Past Surgical History:  Procedure Laterality Date  . ABDOMINAL HYSTERECTOMY  1974  . abdominal tumor  2002  . APPENDECTOMY    . APPLICATION OF A-CELL OF BACK N/A 09/26/2018   Procedure: With Acell;  Surgeon: Wallace Going, DO;  Location: [facility];  Service: Plastics;  Laterality: N/A;  . APPLICATION OF WOUND VAC N/A 09/26/2018   Procedure: Vac placement;  Surgeon: Wallace Going, DO;  Location: [facility];  Service: Plastics;  Laterality: N/A;  . [place]  . BRONCHOSCOPY  2001  . CATARACT EXTRACTION, BILATERAL  09/2019, 10/2019  . CHOLECYSTECTOMY  1984  . INCISION AND DRAINAGE OF WOUND N/A 09/26/2018   Procedure: Debridement of spine wound;  Surgeon: Wallace Going, DO;  Location: [facility];  Service: Plastics;  Laterality: N/A;  . KNEE SURGERY Bilateral 08/27/2010 (L) and 01/11/2011 (R)  . LUMBAR LAMINECTOMY/DECOMPRESSION MICRODISCECTOMY N/A 07/03/2018   Procedure: Lumbar Three to Lumbar Five Laminectomy;  Surgeon:  Judith Part, MD;  Location: [facility];  Service: Neurosurgery;  Laterality: N/A;  . LUMBAR WOUND DEBRIDEMENT N/A 08/20/2018   Procedure: POSTERIOR LUMBAR SPINAL WOUND DEBRIDEMENT AND REVISION;  Surgeon: Judith Part, MD;  Location: [facility];  Service: Neurosurgery;  Laterality: N/A;  POSTERIOR LUMBAR SPINAL WOUND REVISION  . [place]  . OVARIAN CYST SURGERY  1968  . thymus tumor    . 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 TONSILLECTOMY     Social History   Social History Narrative   Walks with cane   Right handed    Caffeine use: Coffee (2 cups every morning)   Tea: sometimes   Soda: none   Immunization History   Administered Date(s) Administered  . Influenza-Unspecified 06/10/2011  . PFIZER(Purple Top)SARS-COV-2 Vaccination 11/13/2019, 11/30/2019  . Pneumococcal Conjugate-13 07/07/2016  . Pneumococcal Polysaccharide-23 11/21/2003, 10/05/2017  . Tdap 05/28/2016     Objective: Vital Signs: BP 135/68 (BP Location: Left Arm, Patient Position: Sitting, Cuff Size: Normal)   Pulse 74   Resp 17   Ht 5\' 6"  (1.676 m)   Wt 230 lb (104.3 kg)   BMI 37.12 kg/m    Physical Exam Vitals and nursing note reviewed.  Constitutional:      Appearance: She is well-developed and well-nourished.  HENT:     Head: Normocephalic and atraumatic.  Eyes:     Extraocular Movements: EOM normal.     Conjunctiva/sclera: Conjunctivae normal.  Cardiovascular:     Pulses: Intact distal pulses.  Pulmonary:     Effort: Pulmonary effort is normal.  Abdominal:     Palpations: Abdomen is soft.  Musculoskeletal:     Cervical back: Normal range of motion.  Skin:    General: Skin is warm and dry.     Capillary Refill: Capillary refill takes less than 2 seconds.  Neurological:     Mental Status: She is alert and oriented to person, place, and time.  Psychiatric:        Mood and Affect: Mood and affect normal.        Behavior: Behavior normal.      Musculoskeletal Exam: Patient is wheelchair-bound.  Generalized hyperalgesia and positive tender points on exam.  C-spine has good range of motion.  Postural thoracic kyphosis noted.  Shoulder joints and elbow joints have good range of motion with no discomfort.  CMC joint prominence noted bilaterally.  She has PIP and DIP thickening consistent with osteoarthritis of both hands.  MCP thickening but no joint tenderness or synovitis was noted.  Hip joints were difficult to assess while she was in a wheelchair.  She has good range of motion of both knee joints with crepitus bilaterally.  No warmth or effusion of knee joints noted on examination today.  Ankle joints have good range of  motion with no discomfort.  Pedal edema noted bilaterally.  CDAI Exam: CDAI Score: 0.7  Patient Global: 5 mm; Provider Global: 2 mm Swollen: 0 ; Tender: 0  Joint Exam 10/22/2020   No joint exam has been documented for this visit   There is currently no information documented on the homunculus. Go to the Rheumatology activity and complete the homunculus joint exam.  Investigation: No additional findings.  Imaging: No results found.  Recent Labs: Lab Results  Component Value Date   WBC 15.0 (H) 09/10/2020   HGB 13.7 09/10/2020   PLT 334 09/10/2020   NA 142 10/12/2020   K 4.0 10/12/2020   CL 100 10/12/2020  CO2 30 10/12/2020   GLUCOSE 249 (H) 10/12/2020   BUN 12 10/12/2020   CREATININE 0.88 10/12/2020   BILITOT 0.4 06/11/2020   ALKPHOS 113 02/20/2017   AST 18 06/11/2020   ALT 13 06/11/2020   PROT 7.1 06/11/2020   ALBUMIN 2.6 (L) 08/22/2018   CALCIUM 9.8 10/12/2020   GFRAA 72 10/12/2020   QFTBGOLDPLUS NEGATIVE 06/11/2020    Speciality Comments: No specialty comments available.  Procedures:  No procedures performed Allergies: Influenza vaccines, Penicillins, Statins, and Sulfamethoxazole-trimethoprim   Assessment / Plan:     Visit Diagnoses: Rheumatoid arthritis involving multiple sites with positive rheumatoid factor ([place]): She has no joint tenderness or synovitis on exam.  She is clinically doing well taking Plaquenil 5 mg 1 tablet by mouth daily.  According to the patient she had a recent upper respiratory tract infection and held Morrie Sheldon for about 2 weeks.  During that time she was experiencing increased pain and intermittent swelling in both hands and both wrist joints.  She resumed Morrie Sheldon about 1 week ago and her symptoms have improved significantly.  She has no inflammation on examination today.  She will continue on Xeljanz 5 mg 1 tablet by mouth daily.  She will also continue going to pain management which has been effective at alleviating her discomfort from  underlying rheumatoid rise, osteoarthritis, and fibromyalgia.  She was advised to notify us if she develops signs or symptoms of more frequent flares.  She will follow-up in the office in 5 months.   High risk medication use - Xeljanz 5 mg 1 tablet daily. D/c Arava-SE of diarrhea, d/c MTX due to recurent infections.BMP updated on 10/12/2020.  CBC updated on 09/10/2020.  Hepatic function panel will be checked today.  Her next routine lab work will be due in April and every 3 months to monitor for drug toxicity.  Standing orders for CBC and CMP were placed today.  TB gold negative on 06/11/20 and will continue to be monitored yearly. She had a recent upper respiratory tract infection and held Morrie Sheldon for about 2 weeks until her symptoms have completely resolved.  We discussed the importance of holding Morrie Sheldon anytime she has signs or symptoms of an infection and to resume only once the infection has completely cleared. She has received 2 Pfizer COVID-19 vaccine doses.  She was encouraged to receive the third dose.  She was advised to hold Xeljanz for 1 week after receiving the vaccine.  She voiced understanding.  She was also encouraged to receive the Shingrix vaccine through her PCPs office.   - Plan: Hepatic function panel, CBC with Differential/Platelet, COMPLETE METABOLIC PANEL WITH GFR  Sjogren's syndrome with keratoconjunctivitis sicca ([place]): She continues to have chronic sicca symptoms which have been tolerable overall.  She uses eyedrops on a daily basis for symptomatic relief.  Fibromyalgia: She has generalized hyperalgesia and positive tender points on exam.  She continues have generalized myalgias and muscle tenderness.  She has primarily wheelchair-bound and is unable to exercise on a regular basis.  She continues to have significant fatigue secondary to insomnia.  She has started to go to pain management which has alleviated some of her discomfort.  Primary osteoarthritis of both knees: Chronic  pain.  She is wheelchair-bound.  She has good range of motion of both knee joints on examination today.  Crepitus noted bilaterally.  No warmth or effusion was noted.  DDD (degenerative disc disease), lumbar - She has chronic lower back pain.  She is wheelchair-bound.  She  has been going to pain management which has started to alleviate some of her discomfort.  She uses Lidoderm patches as needed for pain relief and oxycodone as needed.   Age-related osteoporosis without current pathological fracture - DEXA ordered by PCP.  We will call her PCP to obtain these records.  She has been taking a vitamin D supplement on a daily basis.  Other insomnia: She continues to have chronic insomnia secondary to nocturnal pain.   Other medical conditions are listed as follows:  History of Guillain-Barre syndrome  History of diabetes mellitus  History of gastroesophageal reflux (GERD)  History of COPD  History of depression  History of hypertension  History of peripheral neuropathy  Orders: Orders Placed This Encounter  Procedures  . Hepatic function panel  . CBC with Differential/Platelet  . COMPLETE METABOLIC PANEL WITH GFR   No orders of the defined types were placed in this encounter.     Follow-Up Instructions: Return in about 5 months (around 03/24/2021) for Rheumatoid arthritis, Osteoarthritis.   Ofilia Neas, PA-C  Note - This record has been created using Dragon software.  Chart creation errors have been sought, but may not always  have been located. Such creation errors do not reflect on  the standard of medical care.

## 2020-10-12 ENCOUNTER — Other Ambulatory Visit: Payer: Self-pay

## 2020-10-12 ENCOUNTER — Encounter: Payer: Self-pay | Admitting: Internal Medicine

## 2020-10-12 ENCOUNTER — Other Ambulatory Visit: Payer: Medicare Other

## 2020-10-12 DIAGNOSIS — Z794 Long term (current) use of insulin: Secondary | ICD-10-CM

## 2020-10-12 DIAGNOSIS — E785 Hyperlipidemia, unspecified: Secondary | ICD-10-CM

## 2020-10-12 DIAGNOSIS — E039 Hypothyroidism, unspecified: Secondary | ICD-10-CM | POA: Diagnosis not present

## 2020-10-12 DIAGNOSIS — E1169 Type 2 diabetes mellitus with other specified complication: Secondary | ICD-10-CM | POA: Diagnosis not present

## 2020-10-12 DIAGNOSIS — E1142 Type 2 diabetes mellitus with diabetic polyneuropathy: Secondary | ICD-10-CM | POA: Diagnosis not present

## 2020-10-13 LAB — BASIC METABOLIC PANEL WITH GFR
BUN: 12 mg/dL (ref 7–25)
CO2: 30 mmol/L (ref 20–32)
Calcium: 9.8 mg/dL (ref 8.6–10.4)
Chloride: 100 mmol/L (ref 98–110)
Creat: 0.88 mg/dL (ref 0.60–0.93)
GFR, Est African American: 72 mL/min/{1.73_m2} (ref >=60)
GFR, Est Non African American: 62 mL/min/{1.73_m2} (ref >=60)
Glucose, Bld: 249 mg/dL — ABNORMAL HIGH (ref 65–99)
Potassium: 4 mmol/L (ref 3.5–5.3)
Sodium: 142 mmol/L (ref 135–146)

## 2020-10-13 LAB — HEMOGLOBIN A1C
Hgb A1c MFr Bld: 10.3 % of total Hgb — ABNORMAL HIGH (ref <5.7)
Mean Plasma Glucose: 249 mg/dL
eAG (mmol/L): 13.8 mmol/L

## 2020-10-13 LAB — LIPID PANEL
Cholesterol: 141 mg/dL (ref <200)
HDL: 33 mg/dL — ABNORMAL LOW (ref >=50)
LDL Cholesterol (Calc): 83 mg/dL (calc)
Non-HDL Cholesterol (Calc): 108 mg/dL (calc) (ref <130)
Total CHOL/HDL Ratio: 4.3 (calc) (ref <5.0)
Triglycerides: 157 mg/dL — ABNORMAL HIGH (ref <150)

## 2020-10-13 LAB — TSH: TSH: 0.04 mIU/L — ABNORMAL LOW (ref 0.40–4.50)

## 2020-10-13 NOTE — Progress Notes (Signed)
Bad cholesterol remains above goal of less than 70 so we'll need to adjust her medication accordingly at the appt TSH is low suggesting we are giving her too much levothyroxine and that dose needs to be adjusted.   Sugar average is way up and fasting sugar during lab was 249--thinking she's probably had a course of prednisone in the 3 mos or some injections of steroid?  We'll discuss at her visit.

## 2020-10-14 ENCOUNTER — Other Ambulatory Visit: Payer: Self-pay

## 2020-10-14 ENCOUNTER — Encounter: Payer: Self-pay | Admitting: Internal Medicine

## 2020-10-14 ENCOUNTER — Ambulatory Visit: Payer: Medicare Other | Admitting: Internal Medicine

## 2020-10-14 VITALS — BP 136/84 | HR 88 | Ht 66.0 in | Wt 236.5 lb

## 2020-10-14 DIAGNOSIS — E1142 Type 2 diabetes mellitus with diabetic polyneuropathy: Secondary | ICD-10-CM | POA: Diagnosis not present

## 2020-10-14 DIAGNOSIS — E039 Hypothyroidism, unspecified: Secondary | ICD-10-CM | POA: Diagnosis not present

## 2020-10-14 DIAGNOSIS — Z794 Long term (current) use of insulin: Secondary | ICD-10-CM

## 2020-10-14 LAB — POCT GLUCOSE (DEVICE FOR HOME USE): POC Glucose: 344 mg/dl — AB (ref 70–99)

## 2020-10-14 MED ORDER — DEXCOM G6 TRANSMITTER MISC: 1.0000 | 3 refills | Status: DC

## 2020-10-14 MED ORDER — LEVOTHYROXINE SODIUM 112 MCG PO TABS: 112.0000 ug | ORAL_TABLET | Freq: Every day | ORAL | 3 refills | Status: DC

## 2020-10-14 MED ORDER — DAPAGLIFLOZIN PROPANEDIOL 5 MG PO TABS: 5.0000 mg | ORAL_TABLET | Freq: Every day | ORAL | 6 refills | Status: DC

## 2020-10-14 MED ORDER — DEXCOM G6 SENSOR MISC: 1.0000 | 3 refills | Status: DC

## 2020-10-14 NOTE — Progress Notes (Signed)
Name: Katherine Delgado  MRN/ DOB: [id number], 01-17-1941    Age/ Sex: 80 y.o., female    PCP: Gayland Curry, DO   Reason for Endocrinology Evaluation: Hypothyroidism     Date of Initial Endocrinology Evaluation: 10/14/2020     HPI: Katherine Delgado is a 80 y.o. female with a past medical history of HTN, DM, and Hypothyroidism. The patient presented for initial endocrinology clinic visit on 10/14/2020 for consultative assistance with her Hypothyroidism.     She is accompanied with her son     THYROID HISTORY: She has been on LT-4 replacement for years. Was noted to have a consistently low TSH since 05/2020. Per pt has been diagnosed with MNG in the past. No prior sx or biopsies.       DIABETES HISTORY: She has been diagnosed with DM many years ago. Her A1c was ranged from 6.6% in 2019 to 10.3 % in 2022.   She checks glucose at home twice daily. No reported hypoglycemia.    She is c/o fatigue , can't loose weight and headaches . Cold intolerance as well as hair loss.  No local neck swelling     HOME ENDOCRINE MEDICATIONS: Synthroid 125 mcg daily  Humalog per SS started at 10 units  Toujeo  100 units daily  Victoza 1.8 mg daily    HISTORY:  Past Medical History:  Past Medical History:  Diagnosis Date   Abnormality of gait 04/19/2016   Acute infective polyneuritis ([place]) 2002   Allergic rhinitis due to pollen    Chronic pain syndrome    COPD (chronic obstructive pulmonary disease) (HCC)    chronic bronchitis   Depressive disorder, not elsewhere classified    Diabetes mellitus without complication ([place])    Diaphragmatic hernia without mention of obstruction or gangrene    Dyslipidemia    Fibromyalgia    GERD (gastroesophageal reflux disease)    with h/o esophagitis   Guillain-Barre syndrome (HCC)    History of benign thymus tumor    Insomnia, unspecified    Lumbar spinal stenosis 03/30/2018   L4-5 level, severe   Miscarriage 1962   Mixed  hyperlipidemia    Morbid obesity ([place])    Osteoporosis    Pneumonia    Polyneuropathy in diabetes(357.2)    Rheumatoid arthritis(714.0)    Spondylosis, lumbosacral    Spontaneous ecchymoses    Type II or unspecified type diabetes mellitus with peripheral circulatory disorders, uncontrolled(250.72)    Unspecified chronic bronchitis (HCC)    Unspecified essential hypertension    Unspecified hypothyroidism    Unspecified pruritic disorder    Unspecified urinary incontinence    Past Surgical History:  Past Surgical History:  Procedure Laterality Date   ABDOMINAL HYSTERECTOMY  1974   abdominal tumor  2002   APPENDECTOMY     APPLICATION OF A-CELL OF BACK N/A 09/26/2018   Procedure: With Acell;  Surgeon: Wallace Going, DO;  Location: [place];  Service: Plastics;  Laterality: N/A;   APPLICATION OF WOUND VAC N/A 09/26/2018   Procedure: Vac placement;  Surgeon: Wallace Going, DO;  Location: [place];  Service: Plastics;  Laterality: N/A;   BACK SURGERY  1982   BRONCHOSCOPY  2001   CATARACT EXTRACTION, BILATERAL  09/2019, 10/2019   CHOLECYSTECTOMY  1984   INCISION AND DRAINAGE OF WOUND N/A 09/26/2018   Procedure: Debridement of spine wound;  Surgeon: Wallace Going, DO;  Location: [place];  Service: Plastics;  Laterality: N/A;  KNEE SURGERY Bilateral 08/27/2010 (L) and 01/11/2011 (R)   LUMBAR LAMINECTOMY/DECOMPRESSION MICRODISCECTOMY N/A 07/03/2018   Procedure: Lumbar Three to Lumbar Five Laminectomy;  Surgeon: Judith Part, MD;  Location: [place];  Service: Neurosurgery;  Laterality: N/A;   LUMBAR WOUND DEBRIDEMENT N/A 08/20/2018   Procedure: POSTERIOR LUMBAR SPINAL WOUND DEBRIDEMENT AND REVISION;  Surgeon: Judith Part, MD;  Location: [place];  Service: Neurosurgery;  Laterality: N/A;  POSTERIOR LUMBAR SPINAL WOUND REVISION   miscarrage  1962   OVARIAN CYST SURGERY  1968   thymus tumor     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   TONSILLECTOMY         Social History:  reports that she quit smoking about 40 years ago. Her smoking use included cigarettes. She has a 1.00 pack-year smoking history. She has never used smokeless tobacco. She reports that she does not drink alcohol and does not use drugs.  Family History: family history includes Alzheimer's disease in her mother; Arthritis in her son; Cancer in her brother; Colon polyps in her brother; Heart disease in her father and mother; Liver disease in her father.   HOME MEDICATIONS: Allergies as of 10/14/2020      Reactions   Influenza Vaccines Other (See Comments)   "h/o Guillain Barre; dr's told me years ago never to take another flu shot as it could relapse Rosalee Kaufman; has to do with when vaccine being changed to H1N1 virus"   Penicillins Hives, Itching, Swelling, Rash   Has patient had a PCN reaction causing immediate rash, facial/tongue/throat swelling, SOB or lightheadedness with hypotension:Unknown Has patient had a PCN reaction causing severe rash involving mucus membranes or skin necrosis: Unknown Has patient had a PCN reaction that required hospitalization:Unknown Has patient had a PCN reaction occurring within the last 10 years: Unknown If all of the above answers are "NO", then may proceed with Cephalosporin use.   Statins Other (See Comments)   Myopathy, transaminitis   Sulfamethoxazole-trimethoprim Itching      Medication List       Accurate as of October 14, 2020 10:05 AM. If you have any questions, ask your nurse or doctor.        STOP taking these medications   magnesium oxide 400 MG tablet Commonly known as: MAG-OX Stopped by: Dorita Sciara, MD     TAKE these medications   acetaminophen 325 MG tablet Commonly known as: TYLENOL Take 650 mg by mouth every 6 (six) hours as needed (for pain.).   ALKA-SELTZER PLUS COLD PO Take by mouth as needed.   amitriptyline 50 MG tablet Commonly known as: ELAVIL Take 1 tablet (50 mg total) by mouth at  bedtime.   amLODipine 10 MG tablet Commonly known as: NORVASC TAKE 1 TABLET(10 MG) BY MOUTH DAILY   aspirin EC 81 MG tablet Take 81 mg by mouth 2 (two) times a week.   B-D UF III MINI PEN NEEDLES 31G X 5 MM Misc Generic drug: Insulin Pen Needle USE AS DIRECTED   cholecalciferol 25 MCG (1000 UNIT) tablet Commonly known as: VITAMIN D3 Take 1,000 Units by mouth daily.   docusate sodium 100 MG capsule Commonly known as: COLACE Take 100 mg by mouth daily as needed (constipation.).   ezetimibe 10 MG tablet Commonly known as: ZETIA Take 1 tablet (10 mg total) by mouth daily.   folic acid 1 MG tablet Commonly known as: FOLVITE TAKE 2 TABLETS BY MOUTH  DAILY   furosemide 20 MG tablet Commonly known as: LASIX  TAKE 1 TABLET BY MOUTH TWICE DAILY AS NEEDED FOR 3 POUND WEIGHT GAIN IN ONE DAY OR 5 POUNDS IN ONE WEEK   gabapentin 600 MG tablet Commonly known as: NEURONTIN TAKE 1 TABLET(600 MG) BY MOUTH THREE TIMES DAILY What changed: See the new instructions.   Gemtesa 75 MG Tabs Generic drug: Vibegron Take 1 tablet by mouth daily.   HumaLOG KwikPen 200 UNIT/ML KwikPen Generic drug: insulin lispro Inject 20 Units into the skin 3 (three) times daily.   ipratropium-albuterol 0.5-2.5 (3) MG/3ML Soln Commonly known as: DUONEB Take 3 mLs by nebulization every 6 (six) hours as needed.   levothyroxine 125 MCG tablet Commonly known as: SYNTHROID TAKE 1 TABLET BY MOUTH EVERY DAY 30 MINUTES BEFORE BREAKFAST   lidocaine 5 % Commonly known as: LIDODERM Place 1 patch onto the skin daily. Remove & Discard patch within 12 hours or as directed by MD   lisinopril 20 MG tablet Commonly known as: ZESTRIL Take one tablet by mouth once daily.   loperamide 2 MG tablet Commonly known as: IMODIUM A-D Take 2 mg by mouth 4 (four) times daily as needed for diarrhea or loose stools.   Melatonin 10 MG Tabs Take 10 mg by mouth at bedtime.   metoprolol tartrate 50 MG tablet Commonly known as:  LOPRESSOR Take one tablet by mouth twice daily.   MULTIVITAMIN ADULT PO Take 1 tablet by mouth daily.   nystatin cream Commonly known as: MYCOSTATIN Apply 1 application topically 2 (two) times daily. To skin folds   nystatin powder Commonly known as: nystatin APPLY TOPICALLY THREE TIMES DAILY AS DIRECTED FOR 14 DAYS and prn yeast infection   omega-3 acid ethyl esters 1 g capsule Commonly known as: LOVAZA TAKE ONE CAPSULE BY MOUTH EVERY DAY   oxyCODONE 5 MG immediate release tablet Commonly known as: Roxicodone Take 2 tablets (10 mg total) by mouth 3 (three) times daily as needed for severe pain.   polyvinyl alcohol 1.4 % ophthalmic solution Commonly known as: LIQUIFILM TEARS Place 1 drop into both eyes 4 (four) times daily as needed (for dry/irritated eyes).   potassium chloride 10 MEQ CR capsule Commonly known as: MICRO-K Take 2 capsules (20 mEq total) by mouth 2 (two) times daily as needed (when taking a dose of lasix.).   sodium chloride 0.65 % nasal spray Commonly known as: TGT Saline Nasal Spray Place 2 sprays into the nose as needed for up to 14 days for congestion.   tiotropium 18 MCG inhalation capsule Commonly known as: SPIRIVA Place 1 capsule (18 mcg total) into inhaler and inhale daily.   tiZANidine 2 MG tablet Commonly known as: ZANAFLEX TAKE 1 TABLET(2 MG) BY MOUTH THREE TIMES DAILY   Toujeo SoloStar 300 UNIT/ML Solostar Pen Generic drug: insulin glargine (1 Unit Dial) INJECT 100 UNITS INTO THE SKIN EVERY NIGHT AT BEDTIME   triamterene-hydrochlorothiazide 37.5-25 MG tablet Commonly known as: MAXZIDE-25 TAKE 1 TABLET BY MOUTH  DAILY   venlafaxine XR 75 MG 24 hr capsule Commonly known as: EFFEXOR-XR TAKE 1 CAPSULE BY MOUTH EVERY DAY WITH BREAKFAST   Victoza 18 MG/3ML Sopn Generic drug: liraglutide ADMINISTER 1.8 MG UNDER THE SKIN DAILY FOR BLOOD SUGAR   Xeljanz 5 MG Tabs Generic drug: Tofacitinib Citrate TAKE 1 TABLET BY MOUTH  DAILY              OBJECTIVE:  VS: BP 136/84    Pulse 88    Ht 5\' 6"  (1.676 m)    Wt 236 lb  8 oz (107.3 kg)    SpO2 98%    BMI 38.17 kg/m    Wt Readings from Last 3 Encounters:  10/14/20 236 lb 8 oz (107.3 kg)  10/01/20 236 lb 6.4 oz (107.2 kg)  08/24/20 232 lb (105.2 kg)     EXAM: General: Pt appears well and is in NAD  Eyes: External eye exam normal without stare, lid lag or exophthalmos.  EOM intact.   Neck: General: Supple without adenopathy. Thyroid: Thyroid size normal.  No goiter or nodules appreciated. No thyroid bruit.  Lungs: Clear with good BS bilat with no rales, rhonchi, or wheezes  Heart: Auscultation: RRR.  Abdomen: Normoactive bowel sounds, soft, nontender, without masses or organomegaly palpable  Extremities:  BL LE: No pretibial edema normal ROM and strength.  Skin: Hair: Texture and amount normal with gender appropriate distribution Skin Inspection: No rashes Skin Palpation: Skin temperature, texture, and thickness normal to palpation  Mental Status: Judgment, insight: Intact Orientation: Oriented to time, place, and person Mood and affect: No depression, anxiety, or agitation     DATA REVIEWED:   Results for Katherine Delgado, Katherine Delgado (MRN [id number]) as of 10/15/2020 09:52  Ref. Range 10/12/2020 08:52 10/14/2020 10:44  Sodium Latest Ref Range: 135 - 146 mmol/L 142   Potassium Latest Ref Range: 3.5 - 5.3 mmol/L 4.0   Chloride Latest Ref Range: 98 - 110 mmol/L 100   CO2 Latest Ref Range: 20 - 32 mmol/L 30   Glucose Latest Ref Range: 65 - 99 mg/dL 249 (H)   Mean Plasma Glucose Latest Units: mg/dL 249   BUN Latest Ref Range: 7 - 25 mg/dL 12   Creatinine Latest Ref Range: 0.60 - 0.93 mg/dL 0.88   Calcium Latest Ref Range: 8.6 - 10.4 mg/dL 9.8   BUN/Creatinine Ratio Latest Ref Range: 6 - 22 (calc) NOT APPLICABLE   GFR, Est Non African American Latest Ref Range: > OR = 60 mL/min/1.18m2 62   GFR, Est African American Latest Ref Range: > OR = 60 mL/min/1.20m2 72   Total CHOL/HDL  Ratio Latest Ref Range: <5.0 (calc) 4.3   Cholesterol Latest Ref Range: <200 mg/dL 141   HDL Cholesterol Latest Ref Range: > OR = 50 mg/dL 33 (L)   LDL Cholesterol (Calc) Latest Units: mg/dL (calc) 83   Non-HDL Cholesterol (Calc) Latest Ref Range: <130 mg/dL (calc) 108   Triglycerides Latest Ref Range: <150 mg/dL 157 (H)   eAG (mmol/L) Latest Units: mmol/L 13.8   POC Glucose Latest Ref Range: 70 - 99 mg/dl  344 (A)  Hemoglobin A1C Latest Ref Range: <5.7 % of total Hgb 10.3 (H)   TSH Latest Ref Range: 0.40 - 4.50 mIU/L 0.04 (L)     ASSESSMENT/PLAN/RECOMMENDATIONS:   1. Hypothyroidism  - pt with non-specific symptoms, more on the hypothyroid spectrum but I explained to her that her TFT are more on the hyperthyroid , and I suspect some of these symptoms are not related to thyroid  - TSH continues to be  Suppressed despite reducing LT-4 dose , will reduce further down  - - Pt educated extensively on the correct way to take levothyroxine (first thing in the morning with water, 30 minutes before eating or taking other medications). - Pt encouraged to double dose the following day if she were to miss a dose given long half-life of levothyroxine.    Medications : Stop levothyroxine 125 mcg daily  Start Levothyroxine 112 mcg daily    2. Type 2 Diabetes mellitus, poorly controlled  with Neuropathy :   -I have discussed with the patient the pathophysiology of diabetes. We went over the natural progression of the disease. We talked about both insulin resistance and insulin deficiency. We stressed the importance of lifestyle changes including diet and exercise. I explained the complications associated with diabetes including retinopathy, nephropathy, neuropathy as well as increased risk of cardiovascular disease. We went over the benefit seen with glycemic control.   -Discussed pharmacokinetics of basal/bolus insulin and the importance of taking prandial insulin with meals.  We also discussed  avoiding sugar-sweetened beverages and snacks, when possible.   - Discussed add-on therapy with SGLT-2 inhibitors cautioned against genital infections. She has urinary incontinence and that may be a limiting factor but for now will give it a try  - She will also be provided with a correction scale  - Dexcom prescription sent to the pharmacy   Medications  - Continue Toujeo at 100 units daily  - Continue Humalog 10 units with each meal  - Continue Victoza 1.8 mg daily  - Start Farxiga 5 mg , 1 tablet with Breakfast  - CF: Humalog ( BG- 130/20) starting at 171    F/U in 3 months  Labs 6 weeks    Signed electronically by: Mack Guise, MD  [facility]  [facility] [address] [place], [place] [address] Phone: [phone] FAX: [phone]   CC: Gayland Curry, DO [place] [address] Phone: [phone] Fax: [phone]   Return to Endocrinology clinic as below: Future Appointments  Date Time Provider [place]  10/15/2020 11:00 AM Hollace Kinnier L, DO [facility] None  10/22/2020 10:40 AM Ofilia Neas, PA-C [facility] None  11/04/2020 10:40 AM Ranell Patrick, Clide Deutscher, MD [facility] CPR  01/15/2021 11:00 AM Ngetich, Nelda Bucks, NP [facility] None

## 2020-10-14 NOTE — Patient Instructions (Addendum)
-   Stop levothyroxine 125 mcg  - Start Levothyroxine 112 mcg daily   - Continue Toujeo at 100 units daily  - Continue Humalog 10 units with each meal  - Continue Victoza 1.8 mg daily  - Start Farxiga 5 mg , 1 tablet with Breakfast     - Humalog correctional insulin: ADD extra units on insulin to your meal-time humalog dose if your blood sugars are higher than 170. Use the scale below to help guide you:   Blood sugar before meal Number of units to inject  Less than 170 0 unit  171 -  190 2 units  191 -  210 3 units  211 -  230 4 units  231 -  250 5 units  251 -  270 6 units  271 -  290 7 units  291 -  310 8 units  311- 330 9 units  331- 350 10 units  351 - 370 11 units   371 - 390 12 units           HOW TO TREAT LOW BLOOD SUGARS (Blood sugar LESS THAN 70 MG/DL)  Please follow the RULE OF 15 for the treatment of hypoglycemia treatment (when your (blood sugars are less than 70 mg/dL)    STEP 1: Take 15 grams of carbohydrates when your blood sugar is low, which includes:   3-4 GLUCOSE TABS  OR  3-4 OZ OF JUICE OR REGULAR SODA OR  ONE TUBE OF GLUCOSE GEL     STEP 2: RECHECK blood sugar in 15 MINUTES STEP 3: If your blood sugar is still low at the 15 minute recheck --> then, go back to STEP 1 and treat AGAIN with another 15 grams of carbohydrates.

## 2020-10-15 ENCOUNTER — Ambulatory Visit (INDEPENDENT_AMBULATORY_CARE_PROVIDER_SITE_OTHER): Payer: Medicare Other | Admitting: Internal Medicine

## 2020-10-15 ENCOUNTER — Other Ambulatory Visit: Payer: Self-pay

## 2020-10-15 ENCOUNTER — Encounter: Payer: Self-pay | Admitting: Internal Medicine

## 2020-10-15 VITALS — BP 118/72 | HR 76 | Temp 96.9°F | Ht 66.0 in | Wt 231.0 lb

## 2020-10-15 DIAGNOSIS — M48062 Spinal stenosis, lumbar region with neurogenic claudication: Secondary | ICD-10-CM | POA: Diagnosis not present

## 2020-10-15 DIAGNOSIS — E1142 Type 2 diabetes mellitus with diabetic polyneuropathy: Secondary | ICD-10-CM

## 2020-10-15 DIAGNOSIS — Z794 Long term (current) use of insulin: Secondary | ICD-10-CM | POA: Diagnosis not present

## 2020-10-15 DIAGNOSIS — E785 Hyperlipidemia, unspecified: Secondary | ICD-10-CM | POA: Diagnosis not present

## 2020-10-15 DIAGNOSIS — R29898 Other symptoms and signs involving the musculoskeletal system: Secondary | ICD-10-CM

## 2020-10-15 DIAGNOSIS — E039 Hypothyroidism, unspecified: Secondary | ICD-10-CM | POA: Diagnosis not present

## 2020-10-15 DIAGNOSIS — E1169 Type 2 diabetes mellitus with other specified complication: Secondary | ICD-10-CM | POA: Diagnosis not present

## 2020-10-15 DIAGNOSIS — J42 Unspecified chronic bronchitis: Secondary | ICD-10-CM | POA: Diagnosis not present

## 2020-10-15 NOTE — Progress Notes (Signed)
Location:  [facility] clinic Provider:  Itati Brocksmith L. Mariea Clonts, D.O., C.M.D.  Goals of Care:  Advanced Directives 10/15/2020  Does Patient Have a Medical Advance Directive? Yes  Type of Advance Directive Out of facility DNR (pink MOST or yellow form)  Does patient want to make changes to medical advance directive? No - Patient declined  Copy of [place] in Chart? -  Would patient like information on creating a medical advance directive? -  Pre-existing out of facility DNR order (yellow form or pink MOST form) Pink MOST form placed in chart (order not valid for inpatient use)   Chief Complaint  Patient presents with  . Medical Management of Chronic Issues    4 month follow-up and discuss labs (copy printed).Discuss need for eye exam and covid vaccine. Foot exam today.  Discuss wheelchair order (process started in June 2021). FYI patients lips swelled when using nebulizer, patient is not sure if she had a reaction to the medication or the plastic supplies. Patient states I am still coughing and hacking, not sure what can be done.     HPI: Patient is a 80 y.o. female seen today for medical management of chronic diseases.    She has seen all of specialists except rheum this week.    Endocrine adjusted her levothyroxine dose yesterday.  She also manages diabetes.  She revised her levels too.  She did have a course of prednisone in the past few months that contributed to her hba1c of 10.  Not using dexcom.  She still cough and cannot get up the mucus.  She feels like it's in the bronchi.  Her lungs have been clear.  Has her nebs.  Reviewed that the nebulizers are the best for those symptoms.  She is having trouble with swelling of mouth and lips she says might be from the plastic of the device.  Has eye exam in march or April.  Jan or feb last year, she had cataract surgery and then f/us from that.    Back hurts all of the time.  Does not have strength to walk or stand.  Shots in knees  made pain worse.  Pain specialist wanted to try shots in her hips next.    She is requesting a manual wheelchair, with foot rests that can be adjusted for swelling, foldable, with cushion.  Unable to ambulate any distance, only transfer anymore so walker and cane do not allow her to do her ADLs.    Past Medical History:  Diagnosis Date  . Abnormality of gait 04/19/2016  . Acute infective polyneuritis ([place]) 2002  . Allergic rhinitis due to pollen   . Chronic pain syndrome   . COPD (chronic obstructive pulmonary disease) (HCC)    chronic bronchitis  . Depressive disorder, not elsewhere classified   . Diabetes mellitus without complication ([place])   . Diaphragmatic hernia without mention of obstruction or gangrene   . Dyslipidemia   . Fibromyalgia   . GERD (gastroesophageal reflux disease)    with h/o esophagitis  . Guillain-Barre syndrome ([place])   . History of benign thymus tumor   . Insomnia, unspecified   . Lumbar spinal stenosis 03/30/2018   L4-5 level, severe  . Miscarriage 1962  . Mixed hyperlipidemia   . Morbid obesity ([place])   . Osteoporosis   . Pneumonia   . Polyneuropathy in diabetes(357.2)   . Rheumatoid arthritis(714.0)   . Spondylosis, lumbosacral   . Spontaneous ecchymoses   . Type II or  unspecified type diabetes mellitus with peripheral circulatory disorders, uncontrolled(250.72)   . Unspecified chronic bronchitis ([place])   . Unspecified essential hypertension   . Unspecified hypothyroidism   . Unspecified pruritic disorder   . Unspecified urinary incontinence     Past Surgical History:  Procedure Laterality Date  . ABDOMINAL HYSTERECTOMY  1974  . abdominal tumor  2002  . APPENDECTOMY    . APPLICATION OF A-CELL OF BACK N/A 09/26/2018   Procedure: With A[place];  Surgeon: Wallace Going, DO;  Location: [place];  Service: Plastics;  Laterality: N/A;  . APPLICATION OF WOUND VAC N/A 09/26/2018   Procedure: Vac placement;  Surgeon: Wallace Going, DO;  Location: [place];  Service: Plastics;  Laterality: N/A;  . [place]  . BRONCHOSCOPY  2001  . CATARACT EXTRACTION, BILATERAL  09/2019, 10/2019  . CHOLECYSTECTOMY  1984  . INCISION AND DRAINAGE OF WOUND N/A 09/26/2018   Procedure: Debridement of spine wound;  Surgeon: Wallace Going, DO;  Location: [place];  Service: Plastics;  Laterality: N/A;  . KNEE SURGERY Bilateral 08/27/2010 (L) and 01/11/2011 (R)  . LUMBAR LAMINECTOMY/DECOMPRESSION MICRODISCECTOMY N/A 07/03/2018   Procedure: Lumbar Three to Lumbar Five Laminectomy;  Surgeon: Judith Part, MD;  Location: [place];  Service: Neurosurgery;  Laterality: N/A;  . LUMBAR WOUND DEBRIDEMENT N/A 08/20/2018   Procedure: POSTERIOR LUMBAR SPINAL WOUND DEBRIDEMENT AND REVISION;  Surgeon: Judith Part, MD;  Location: [place];  Service: Neurosurgery;  Laterality: N/A;  POSTERIOR LUMBAR SPINAL WOUND REVISION  . [place]  . OVARIAN CYST SURGERY  1968  . thymus tumor    . 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 TONSILLECTOMY      Allergies  Allergen Reactions  . Influenza Vaccines Other (See Comments)    "h/o Guillain Barre; dr's told me years ago never to take another flu shot as it could relapse Rosalee Kaufman; has to do with when vaccine being changed to H1N1 virus"  . Penicillins Hives, Itching, Swelling and Rash    Has patient had a PCN reaction causing immediate rash, facial/tongue/throat swelling, SOB or lightheadedness with hypotension:Unknown Has patient had a PCN reaction causing severe rash involving mucus membranes or skin necrosis: Unknown Has patient had a PCN reaction that required hospitalization:Unknown Has patient had a PCN reaction occurring within the last 10 years: Unknown If all of the above answers are "NO", then may proceed with Cephalosporin use.   . Statins Other (See Comments)    Myopathy, transaminitis  . Sulfamethoxazole-Trimethoprim Itching    Outpatient Encounter Medications as of 10/15/2020  Medication Sig  .  acetaminophen (TYLENOL) 325 MG tablet Take 650 mg by mouth every 6 (six) hours as needed (for pain.).  Marland Kitchen amitriptyline (ELAVIL) 25 MG tablet Take 25 mg by mouth at bedtime.  Marland Kitchen amLODipine (NORVASC) 10 MG tablet TAKE 1 TABLET(10 MG) BY MOUTH DAILY  . aspirin EC 81 MG tablet Take 81 mg by mouth 2 (two) times a week.  . Chlorphen-Phenyleph-ASA (ALKA-SELTZER PLUS COLD PO) Take by mouth as needed.  . cholecalciferol (VITAMIN D3) 25 MCG (1000 UT) tablet Take 1,000 Units by mouth daily.  . dapagliflozin propanediol (FARXIGA) 5 MG TABS tablet Take 1 tablet (5 mg total) by mouth daily before breakfast.  . docusate sodium (COLACE) 100 MG capsule Take 100 mg by mouth daily as needed (constipation.).   Marland Kitchen ezetimibe (ZETIA) 10 MG tablet Take 1 tablet (10 mg total) by mouth daily.  . folic acid (  FOLVITE) 1 MG tablet TAKE 2 TABLETS BY MOUTH  DAILY  . furosemide (LASIX) 20 MG tablet TAKE 1 TABLET BY MOUTH TWICE DAILY AS NEEDED FOR 3 POUND WEIGHT GAIN IN ONE DAY OR 5 POUNDS IN ONE WEEK  . gabapentin (NEURONTIN) 600 MG tablet Take 600 mg by mouth 2 (two) times daily.  . Insulin Lispro (HUMALOG KWIKPEN) 200 UNIT/ML SOPN Inject 20 Units into the skin 3 (three) times daily.  . Insulin Pen Needle (B-D UF III MINI PEN NEEDLES) 31G X 5 MM MISC USE AS DIRECTED  . ipratropium-albuterol (DUONEB) 0.5-2.5 (3) MG/3ML SOLN Take 3 mLs by nebulization every 6 (six) hours as needed.  Marland Kitchen levothyroxine (SYNTHROID) 112 MCG tablet Take 1 tablet (112 mcg total) by mouth daily.  Marland Kitchen lidocaine (LIDODERM) 5 % Place 1 patch onto the skin daily. Remove & Discard patch within 12 hours or as directed by MD  . lisinopril (ZESTRIL) 20 MG tablet Take one tablet by mouth once daily.  Marland Kitchen loperamide (IMODIUM A-D) 2 MG tablet Take 2 mg by mouth 4 (four) times daily as needed for diarrhea or loose stools.  . Melatonin 10 MG TABS Take 10 mg by mouth at bedtime.  . metoprolol tartrate (LOPRESSOR) 50 MG tablet Take one tablet by mouth twice daily.  .  Multiple Vitamins-Minerals (MULTIVITAMIN ADULT PO) Take 1 tablet by mouth daily.  Marland Kitchen nystatin (NYSTATIN) powder APPLY TOPICALLY THREE TIMES DAILY AS DIRECTED FOR 14 DAYS and prn yeast infection  . nystatin cream (MYCOSTATIN) Apply 1 application topically 2 (two) times daily. To skin folds  . omega-3 acid ethyl esters (LOVAZA) 1 g capsule TAKE ONE CAPSULE BY MOUTH EVERY DAY  . oxyCODONE (ROXICODONE) 5 MG immediate release tablet Take 2 tablets (10 mg total) by mouth 3 (three) times daily as needed for severe pain.  . polyvinyl alcohol (LIQUIFILM TEARS) 1.4 % ophthalmic solution Place 1 drop into both eyes 4 (four) times daily as needed (for dry/irritated eyes).   . potassium chloride (MICRO-K) 10 MEQ CR capsule Take 2 capsules (20 mEq total) by mouth 2 (two) times daily as needed (when taking a dose of lasix.).  Marland Kitchen tiotropium (SPIRIVA) 18 MCG inhalation capsule Place 1 capsule (18 mcg total) into inhaler and inhale daily.  Marland Kitchen tiZANidine (ZANAFLEX) 2 MG tablet TAKE 1 TABLET(2 MG) BY MOUTH THREE TIMES DAILY  . TOUJEO SOLOSTAR 300 UNIT/ML Solostar Pen INJECT 100 UNITS INTO THE SKIN EVERY NIGHT AT BEDTIME  . triamterene-hydrochlorothiazide (MAXZIDE-25) 37.5-25 MG tablet TAKE 1 TABLET BY MOUTH  DAILY  . venlafaxine XR (EFFEXOR-XR) 75 MG 24 hr capsule TAKE 1 CAPSULE BY MOUTH EVERY DAY WITH BREAKFAST  . Vibegron (GEMTESA) 75 MG TABS Take 1 tablet by mouth daily.  Marland Kitchen VICTOZA 18 MG/3ML SOPN ADMINISTER 1.8 MG UNDER THE SKIN DAILY FOR BLOOD SUGAR  . XELJANZ 5 MG TABS TAKE 1 TABLET BY MOUTH  DAILY  . Continuous Blood Gluc Sensor (DEXCOM G6 SENSOR) MISC 1 Device by Does not apply route as directed. (Patient not taking: Reported on 10/15/2020)  . Continuous Blood Gluc Transmit (DEXCOM G6 TRANSMITTER) MISC 1 Device by Does not apply route as directed. (Patient not taking: Reported on 10/15/2020)  . sodium chloride (TGT SALINE NASAL SPRAY) 0.65 % nasal spray Place 2 sprays into the nose as needed for up to 14 days for  congestion. (Patient not taking: Reported on 10/14/2020)  . [DISCONTINUED] amitriptyline (ELAVIL) 50 MG tablet Take 1 tablet (50 mg total) by mouth at bedtime. (Patient not  taking: Reported on 10/15/2020)  . [DISCONTINUED] gabapentin (NEURONTIN) 600 MG tablet TAKE 1 TABLET(600 MG) BY MOUTH THREE TIMES DAILY (Patient taking differently: Take 600 mg by mouth 2 (two) times daily.)   No facility-administered encounter medications on file as of 10/15/2020.    Review of Systems:  Review of Systems  Constitutional: Negative for chills and fever.  HENT: Positive for hearing loss. Negative for congestion and sore throat.   Eyes: Negative for blurred vision.  Respiratory: Positive for cough. Negative for sputum production, shortness of breath and wheezing.   Cardiovascular: Positive for leg swelling. Negative for chest pain and palpitations.       Edema better today  Gastrointestinal: Negative for abdominal pain, blood in stool, constipation, diarrhea and melena.  Genitourinary: Negative for dysuria.  Musculoskeletal: Positive for back pain and joint pain. Negative for falls.  Skin:       Callus left great toe  Neurological: Positive for tingling and sensory change. Negative for dizziness and loss of consciousness.  Endo/Heme/Allergies: Bruises/bleeds easily.  Psychiatric/Behavioral: Positive for depression. Negative for memory loss. The patient is not nervous/anxious and does not have insomnia.     Health Maintenance  Topic Date Due  . COVID-19 Vaccine (3 - Pfizer risk 4-dose series) 12/28/2019  . FOOT EXAM  04/17/2020  . OPHTHALMOLOGY EXAM  10/02/2020  . HEMOGLOBIN A1C  04/11/2021  . DEXA SCAN  09/20/2025  . TETANUS/TDAP  05/28/2026  . Hepatitis C Screening  Completed  . PNA vac Low Risk Adult  Completed  . INFLUENZA VACCINE  Discontinued    Physical Exam: Vitals:   10/15/20 1117  BP: 118/72  Pulse: 76  Temp: (!) 96.9 F (36.1 C)  TempSrc: Temporal  SpO2: 97%  Weight: 231 lb  (104.8 kg)  Height: 5\' 6"  (1.676 m)   Body mass index is 37.28 kg/m. Physical Exam Vitals reviewed.  Constitutional:      General: She is not in acute distress.    Appearance: Normal appearance. She is not ill-appearing or toxic-appearing.  HENT:     Head: Normocephalic and atraumatic.     Right Ear: External ear normal.     Left Ear: External ear normal.  Cardiovascular:     Rate and Rhythm: Normal rate and regular rhythm.  Pulmonary:     Effort: Pulmonary effort is normal.     Breath sounds: Normal breath sounds.  Abdominal:     General: Bowel sounds are normal.     Palpations: Abdomen is soft.  Musculoskeletal:        General: Normal range of motion.     Right lower leg: Edema present.     Left lower leg: Edema present.  Skin:    General: Skin is warm and dry.     Comments: Callus left great toe, fungal nails Diabetic foot exam was performed with the following findings:   Normal sensation of 10g monofilament Intact posterior tibialis and dorsalis pedis pulses Left great toe callus     Neurological:     General: No focal deficit present.     Mental Status: She is alert and oriented to person, place, and time.     Cranial Nerves: No cranial nerve deficit.     Sensory: Sensory deficit present.     Motor: Weakness present.     Coordination: Coordination normal.     Gait: Gait abnormal.     Deep Tendon Reflexes: Reflexes normal.     Comments: Uses manual wheelchair primarily for mobility now,  can only transfer b/w chair, bed, wheelchair     Labs reviewed: Basic Metabolic Panel: Recent Labs    01/24/20 1148 06/11/20 0757 07/28/20 0856 10/12/20 0852  NA 143 140  --  142  K 4.1 4.2  --  4.0  CL 104 100  --  100  CO2 29 30  --  30  GLUCOSE 80 127*  --  249*  BUN 16 21  --  12  CREATININE 0.84 1.11*  --  0.88  CALCIUM 9.5 9.9  --  9.8  TSH  --  0.19* 0.05* 0.04*   Liver Function Tests: Recent Labs    01/24/20 1148 06/11/20 0757  AST 15 18  ALT 9 13   BILITOT 0.3 0.4  PROT 6.7 7.1   No results for input(s): LIPASE, AMYLASE in the last 8760 hours. No results for input(s): AMMONIA in the last 8760 hours. CBC: Recent Labs    06/11/20 0757 08/31/20 1412 09/10/20 1035  WBC 11.8* 18.7* 15.0*  NEUTROABS 8,520* 15,558* 11,925*  HGB 14.4 15.1 13.7  HCT 44.8 45.7* 41.8  MCV 84.8 83.5 84.6  PLT 333 422* 334   Lipid Panel: Recent Labs    06/11/20 0757 10/12/20 0852  CHOL 167 141  HDL 40* 33*  LDLCALC 98 83  TRIG 197* 157*  CHOLHDL 4.2 4.3   Lab Results  Component Value Date   HGBA1C 10.3 (H) 10/12/2020    Procedures since last visit: No results found.  Assessment/Plan 1. Weakness of both lower extremities She's no longer able to get around with use of a walker or cane to do ADLs--requires use of manual wheelchair - DME Wheelchair manual  2. Type 2 diabetes mellitus with diabetic polyneuropathy, with long-term current use of insulin (HCC) - control quite poor but had bronchitis and a course of prednisone during the last 3 month measure -saw endocrine and insulins were adjusted - she never really has changed things as I've suggested and says nobody did anything about her diabetes in years since pharmD left here -she's pretty immobile which does not help - Hemoglobin A1c; Future - COMPLETE METABOLIC PANEL WITH GFR; Future - CBC with Differential/Platelet; Future  3. Spinal stenosis of lumbar region with neurogenic claudication -had back surgery, but still has some chronic pain and has really not been ambulatory since - DME Wheelchair manual  4. Acquired hypothyroidism - levothyroxine was adjusted by endocrinology also - TSH; Future  5. Chronic bronchitis, unspecified chronic bronchitis type ([place]) -cont current inhaler regimen, still getting over end of latest exacerbation of COPD  6. Hyperlipidemia associated with type 2 diabetes mellitus ([place]) -cont zetia, c/o myalgias and myopathy from statins - COMPLETE  METABOLIC PANEL WITH GFR; Future - Lipid panel; Future  Labs/tests ordered:   Lab Orders     Hemoglobin A1c     COMPLETE METABOLIC PANEL WITH GFR     CBC with Differential/Platelet     TSH     Lipid panel  Next appt:  01/15/2021    Ruben Mahler L. Krystalynn Ridgeway, D.O. [facility] [address], [place] [address] Cell Phone (Mon-Fri 8am-5pm):  [phone] On Call:  [phone] & follow prompts after 5pm & weekends Office Phone:  [phone] Office Fax:  [phone]

## 2020-10-15 NOTE — Patient Instructions (Addendum)
I do recommend you get your covid booster done.    Follow-up with [facility]

## 2020-10-22 ENCOUNTER — Ambulatory Visit: Payer: Medicare Other | Admitting: Physician Assistant

## 2020-10-22 ENCOUNTER — Encounter: Payer: Self-pay | Admitting: Physician Assistant

## 2020-10-22 ENCOUNTER — Other Ambulatory Visit: Payer: Self-pay

## 2020-10-22 VITALS — BP 135/68 | HR 74 | Resp 17 | Ht 66.0 in | Wt 230.0 lb

## 2020-10-22 DIAGNOSIS — M797 Fibromyalgia: Secondary | ICD-10-CM | POA: Diagnosis not present

## 2020-10-22 DIAGNOSIS — M3501 Sicca syndrome with keratoconjunctivitis: Secondary | ICD-10-CM | POA: Diagnosis not present

## 2020-10-22 DIAGNOSIS — Z8709 Personal history of other diseases of the respiratory system: Secondary | ICD-10-CM

## 2020-10-22 DIAGNOSIS — Z8639 Personal history of other endocrine, nutritional and metabolic disease: Secondary | ICD-10-CM

## 2020-10-22 DIAGNOSIS — M170 Bilateral primary osteoarthritis of knee: Secondary | ICD-10-CM

## 2020-10-22 DIAGNOSIS — M51369 Other intervertebral disc degeneration, lumbar region without mention of lumbar back pain or lower extremity pain: Secondary | ICD-10-CM

## 2020-10-22 DIAGNOSIS — M810 Age-related osteoporosis without current pathological fracture: Secondary | ICD-10-CM

## 2020-10-22 DIAGNOSIS — M0579 Rheumatoid arthritis with rheumatoid factor of multiple sites without organ or systems involvement: Secondary | ICD-10-CM | POA: Diagnosis not present

## 2020-10-22 DIAGNOSIS — Z8669 Personal history of other diseases of the nervous system and sense organs: Secondary | ICD-10-CM | POA: Diagnosis not present

## 2020-10-22 DIAGNOSIS — Z79899 Other long term (current) drug therapy: Secondary | ICD-10-CM | POA: Diagnosis not present

## 2020-10-22 DIAGNOSIS — M5136 Other intervertebral disc degeneration, lumbar region: Secondary | ICD-10-CM | POA: Diagnosis not present

## 2020-10-22 DIAGNOSIS — Z8719 Personal history of other diseases of the digestive system: Secondary | ICD-10-CM

## 2020-10-22 DIAGNOSIS — G4709 Other insomnia: Secondary | ICD-10-CM

## 2020-10-22 DIAGNOSIS — Z8659 Personal history of other mental and behavioral disorders: Secondary | ICD-10-CM

## 2020-10-22 DIAGNOSIS — Z8679 Personal history of other diseases of the circulatory system: Secondary | ICD-10-CM

## 2020-10-22 LAB — HEPATIC FUNCTION PANEL
AG Ratio: 1.3 (calc) (ref 1.0–2.5)
ALT: 19 U/L (ref 6–29)
AST: 35 U/L (ref 10–35)
Albumin: 4 g/dL (ref 3.6–5.1)
Alkaline phosphatase (APISO): 111 U/L (ref 37–153)
Bilirubin, Direct: 0.1 mg/dL (ref 0.0–0.2)
Globulin: 3 g/dL (calc) (ref 1.9–3.7)
Indirect Bilirubin: 0.3 mg/dL (calc) (ref 0.2–1.2)
Total Bilirubin: 0.4 mg/dL (ref 0.2–1.2)
Total Protein: 7 g/dL (ref 6.1–8.1)

## 2020-10-22 NOTE — Patient Instructions (Signed)
Standing Labs We placed an order today for your standing lab work.   Please have your standing labs drawn in April and every 3 months   If possible, please have your labs drawn 2 weeks prior to your appointment so that the provider can discuss your results at your appointment.  We have open lab daily Monday through Thursday from 1:30-4:30 PM and Friday from 1:30-4:00 PM at the office of Dr. Bo Merino, [facility].   Please be advised, all patients with office appointments requiring lab work will take precedents over walk-in lab work.  If possible, please come for your lab work on Monday and Friday afternoons, as you may experience shorter wait times. The office is located at [address], [place], [place], [place] [address] No appointment is necessary.   Labs are drawn by [facility]. Please bring your co-pay at the time of your lab draw.  You may receive a bill from [facility] for your lab work.  If you wish to have your labs drawn at another location, please call the office 24 hours in advance to send orders.  If you have any questions regarding directions or hours of operation,  please call [phone].   As a reminder, please drink plenty of water prior to coming for your lab work. Thanks!

## 2020-10-23 ENCOUNTER — Other Ambulatory Visit: Payer: Self-pay | Admitting: Physical Medicine and Rehabilitation

## 2020-10-23 NOTE — Progress Notes (Signed)
Hepatic function panel

## 2020-10-27 ENCOUNTER — Other Ambulatory Visit: Payer: Self-pay | Admitting: Internal Medicine

## 2020-11-04 ENCOUNTER — Encounter: Payer: Medicare Other | Admitting: Physical Medicine and Rehabilitation

## 2020-11-07 ENCOUNTER — Other Ambulatory Visit: Payer: Self-pay

## 2020-11-07 ENCOUNTER — Emergency Department (HOSPITAL_COMMUNITY)
Admission: EM | Admit: 2020-11-07 | Discharge: 2020-11-07 | Disposition: A | Discharge status: Home / Self Care | Payer: Medicare Other | Attending: Emergency Medicine | Admitting: Emergency Medicine

## 2020-11-07 ENCOUNTER — Encounter (HOSPITAL_COMMUNITY): Payer: Self-pay | Admitting: Emergency Medicine

## 2020-11-07 DIAGNOSIS — I10 Essential (primary) hypertension: Secondary | ICD-10-CM | POA: Insufficient documentation

## 2020-11-07 DIAGNOSIS — Z7982 Long term (current) use of aspirin: Secondary | ICD-10-CM | POA: Diagnosis not present

## 2020-11-07 DIAGNOSIS — Z87891 Personal history of nicotine dependence: Secondary | ICD-10-CM | POA: Insufficient documentation

## 2020-11-07 DIAGNOSIS — Z79899 Other long term (current) drug therapy: Secondary | ICD-10-CM | POA: Diagnosis not present

## 2020-11-07 DIAGNOSIS — M25512 Pain in left shoulder: Secondary | ICD-10-CM | POA: Insufficient documentation

## 2020-11-07 DIAGNOSIS — Z794 Long term (current) use of insulin: Secondary | ICD-10-CM | POA: Insufficient documentation

## 2020-11-07 DIAGNOSIS — E039 Hypothyroidism, unspecified: Secondary | ICD-10-CM | POA: Diagnosis not present

## 2020-11-07 DIAGNOSIS — M79602 Pain in left arm: Secondary | ICD-10-CM | POA: Insufficient documentation

## 2020-11-07 DIAGNOSIS — Z7951 Long term (current) use of inhaled steroids: Secondary | ICD-10-CM | POA: Diagnosis not present

## 2020-11-07 DIAGNOSIS — E1142 Type 2 diabetes mellitus with diabetic polyneuropathy: Secondary | ICD-10-CM | POA: Diagnosis not present

## 2020-11-07 DIAGNOSIS — J449 Chronic obstructive pulmonary disease, unspecified: Secondary | ICD-10-CM | POA: Diagnosis not present

## 2020-11-07 MED ORDER — OXYCODONE HCL 5 MG PO TABS
5.0000 mg | ORAL_TABLET | Freq: Once | ORAL | Status: AC
  Administered 2020-11-07: 5 mg via ORAL
  Filled 2020-11-07: qty 1

## 2020-11-07 MED ORDER — ACETAMINOPHEN 500 MG PO TABS
1000.0000 mg | ORAL_TABLET | Freq: Once | ORAL | Status: AC
  Administered 2020-11-07: 1000 mg via ORAL
  Filled 2020-11-07: qty 2

## 2020-11-07 NOTE — ED Provider Notes (Signed)
[facility] DEPT Provider Note   CSN: [id number] Arrival date & time: 11/07/20  2117     History Chief Complaint  Patient presents with  . Shoulder Pain  . Arm Pain    Katherine Delgado is a 80 y.o. female.  80 yo F with a chief complaints of left arm pain.  Going on just today.  Denies any injury.  Pain mostly to the left upper arm that feels like it radiates into the shoulder and then up into the neck.  She does not know what makes it better or worse.  Had a couple waves of nausea that resolved.  She is here to get it checked out.  The history is provided by the patient.  Shoulder Pain Location:  Shoulder and arm Shoulder location:  L shoulder Arm location:  L arm Injury: no   Pain details:    Quality:  Aching   Radiates to: L neck.   Severity:  Moderate   Onset quality:  Sudden   Duration:  2 days   Timing:  Constant   Progression:  Worsening Prior injury to area:  No Relieved by:  Nothing Worsened by:  Nothing Ineffective treatments:  None tried Associated symptoms: no fever   Arm Pain Pertinent negatives include no chest pain, no headaches and no shortness of breath.       Past Medical History:  Diagnosis Date  . Abnormality of gait 04/19/2016  . Acute infective polyneuritis ([place]) 2002  . Allergic rhinitis due to pollen   . Chronic pain syndrome   . COPD (chronic obstructive pulmonary disease) (HCC)    chronic bronchitis  . Depressive disorder, not elsewhere classified   . Diabetes mellitus without complication ([place])   . Diaphragmatic hernia without mention of obstruction or gangrene   . Dyslipidemia   . Fibromyalgia   . GERD (gastroesophageal reflux disease)    with h/o esophagitis  . Guillain-Barre syndrome ([place])   . History of benign thymus tumor   . Insomnia, unspecified   . Lumbar spinal stenosis 03/30/2018   L4-5 level, severe  . Miscarriage 1962  . Mixed hyperlipidemia   . Morbid obesity ([place])   . Osteoporosis   .  Pneumonia   . Polyneuropathy in diabetes(357.2)   . Rheumatoid arthritis(714.0)   . Spondylosis, lumbosacral   . Spontaneous ecchymoses   . Type II or unspecified type diabetes mellitus with peripheral circulatory disorders, uncontrolled(250.72)   . Unspecified chronic bronchitis ([place])   . Unspecified essential hypertension   . Unspecified hypothyroidism   . Unspecified pruritic disorder   . Unspecified urinary incontinence     Patient Active Problem List   Diagnosis Date Noted  . 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 Primary osteoarthritis of right knee 07/25/2019  . Wound dehiscence 08/17/2018  . Lumbar spinal stenosis 03/30/2018  . Rheumatoid arthritis involving both wrists with positive rheumatoid factor ([place]) 06/14/2016  . High risk medication use 06/14/2016  . Sjogren's syndrome ([place]) 06/14/2016  . Primary osteoarthritis of both knees 06/14/2016  . DDD (degenerative disc disease), lumbar 06/14/2016  . Hypothyroidism 06/14/2016  . Osteoporosis 06/14/2016  . Abnormality of gait 04/19/2016  . Type 2 diabetes mellitus with diabetic polyneuropathy, with long-term current use of insulin ([place]) 11/27/2015  . Diabetes mellitus without complication ([place]) 84/16/6063  . Headache(784.0) 07/31/2013  . Sinus infection 07/31/2013  . Chronic right-sided low back pain with right-sided sciatica 03/14/2013  . Insomnia 12/06/2012  . Nausea alone 12/06/2012  .  Diarrhea 12/06/2012  . Urinary incontinence, urge 12/06/2012  . Lumbosacral root lesions, not elsewhere classified 11/27/2012  . Diabetic polyneuropathy associated with type 2 diabetes mellitus ([place]) 11/27/2012  . EDEMA 05/04/2010  . YEAST INFECTION 05/03/2010  . Hyperlipidemia 05/03/2010  . DEPRESSION 05/03/2010  . History of peripheral neuropathy 05/03/2010  . Essential hypertension 05/03/2010  . ALLERGIC RHINITIS 05/03/2010  . PNEUMONIA 05/03/2010  . COPD (chronic obstructive pulmonary disease) ([place]) 05/03/2010   . GERD 05/03/2010  . Fibromyalgia 05/03/2010  . DYSPNEA 05/03/2010  . CHEST PAIN 05/03/2010    Past Surgical History:  Procedure Laterality Date  . ABDOMINAL HYSTERECTOMY  1974  . abdominal tumor  2002  . APPENDECTOMY    . APPLICATION OF A-CELL OF BACK N/A 09/26/2018   Procedure: With [place];  Surgeon: Wallace Going, DO;  Location: [facility];  Service: Plastics;  Laterality: N/A;  . APPLICATION OF WOUND VAC N/A 09/26/2018   Procedure: Vac placement;  Surgeon: Wallace Going, DO;  Location: [facility];  Service: Plastics;  Laterality: N/A;  . [place]  . BRONCHOSCOPY  2001  . CATARACT EXTRACTION, BILATERAL  09/2019, 10/2019  . CHOLECYSTECTOMY  1984  . INCISION AND DRAINAGE OF WOUND N/A 09/26/2018   Procedure: Debridement of spine wound;  Surgeon: Wallace Going, DO;  Location: [facility];  Service: Plastics;  Laterality: N/A;  . KNEE SURGERY Bilateral 08/27/2010 (L) and 01/11/2011 (R)  . LUMBAR LAMINECTOMY/DECOMPRESSION MICRODISCECTOMY N/A 07/03/2018   Procedure: Lumbar Three to Lumbar Five Laminectomy;  Surgeon: Judith Part, MD;  Location: [facility];  Service: Neurosurgery;  Laterality: N/A;  . LUMBAR WOUND DEBRIDEMENT N/A 08/20/2018   Procedure: POSTERIOR LUMBAR SPINAL WOUND DEBRIDEMENT AND REVISION;  Surgeon: Judith Part, MD;  Location: [facility];  Service: Neurosurgery;  Laterality: N/A;  POSTERIOR LUMBAR SPINAL WOUND REVISION  . [place]  . OVARIAN CYST SURGERY  1968  . thymus tumor    . 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 TONSILLECTOMY       OB History   No obstetric history on file.     Family History  Problem Relation Age of Onset  . Alzheimer's disease Mother   . Heart disease Mother   . Heart disease Father   . Liver disease Father   . Cancer Brother   . Arthritis Son   . Colon polyps Brother   . Colon cancer Neg Hx   . Esophageal cancer Neg Hx   . Kidney disease Neg Hx   . Stomach cancer Neg Hx   . Rectal cancer Neg Hx     Social History    Tobacco Use  . Smoking status: Former Smoker    Packs/day: 0.10    Years: 10.00    Pack years: 1.00    Types: Cigarettes    Quit date: 12/07/1979    Years since quitting: 40.9  . Smokeless tobacco: Never Used  Vaping Use  . Vaping Use: Never used  Substance Use Topics  . Alcohol use: No    Alcohol/week: 0.0 standard drinks  . Drug use: No    Home Medications Prior to Admission medications   Medication Sig Start Date End Date Taking? Authorizing Provider  acetaminophen (TYLENOL) 325 MG tablet Take 650 mg by mouth every 6 (six) hours as needed (for pain.).    [provider]  amitriptyline (ELAVIL) 25 MG tablet Take 25 mg by mouth at bedtime.    [provider]  amLODipine (NORVASC) 10  MG tablet TAKE 1 TABLET(10 MG) BY MOUTH DAILY 05/15/20   Reed, Tiffany L, DO  aspirin EC 81 MG tablet Take 81 mg by mouth 2 (two) times a week.    [provider]  Chlorphen-Phenyleph-ASA (ALKA-SELTZER PLUS COLD PO) Take by mouth as needed.    [provider]  cholecalciferol (VITAMIN D3) 25 MCG (1000 UT) tablet Take 1,000 Units by mouth daily.    [provider]  dapagliflozin propanediol (FARXIGA) 5 MG TABS tablet Take 1 tablet (5 mg total) by mouth daily before breakfast. 10/14/20   Shamleffer, Melanie Crazier, MD  docusate sodium (COLACE) 100 MG capsule Take 100 mg by mouth daily as needed (constipation.).     [provider]  ezetimibe (ZETIA) 10 MG tablet Take 1 tablet (10 mg total) by mouth daily. 06/15/20   Reed, Tiffany L, DO  folic acid (FOLVITE) 1 MG tablet TAKE 2 TABLETS BY MOUTH  DAILY 04/13/20   Reed, Tiffany L, DO  furosemide (LASIX) 20 MG tablet TAKE 1 TABLET BY MOUTH TWICE DAILY AS NEEDED FOR 3 POUND WEIGHT GAIN IN ONE DAY OR 5 POUNDS IN ONE WEEK 07/27/20   Reed, Tiffany L, DO  gabapentin (NEURONTIN) 600 MG tablet Take 600 mg by mouth 2 (two) times daily.    [provider]  Insulin Lispro (HUMALOG KWIKPEN) 200 UNIT/ML SOPN  Inject 20 Units into the skin 3 (three) times daily. 06/11/19   Reed, Tiffany L, DO  Insulin Pen Needle (B-D UF III MINI PEN NEEDLES) 31G X 5 MM MISC USE AS DIRECTED 02/10/20   Reed, Tiffany L, DO  ipratropium-albuterol (DUONEB) 0.5-2.5 (3) MG/3ML SOLN Take 3 mLs by nebulization every 6 (six) hours as needed. 09/07/20   Reed, Tiffany L, DO  levothyroxine (SYNTHROID) 112 MCG tablet Take 1 tablet (112 mcg total) by mouth daily. 10/14/20   Shamleffer, Melanie Crazier, MD  lidocaine (LIDODERM) 5 % Place 1 patch onto the skin daily. Remove & Discard patch within 12 hours or as directed by MD 10/01/20   Raulkar, Clide Deutscher, MD  lisinopril (ZESTRIL) 20 MG tablet Take one tablet by mouth once daily. 08/12/20   Reed, Tiffany L, DO  loperamide (IMODIUM A-D) 2 MG tablet Take 2 mg by mouth 4 (four) times daily as needed for diarrhea or loose stools.    [provider]  Melatonin 10 MG TABS Take 10 mg by mouth at bedtime.    [provider]  metoprolol tartrate (LOPRESSOR) 50 MG tablet Take one tablet by mouth twice daily. 04/22/20   Reed, Tiffany L, DO  Multiple Vitamins-Minerals (MULTIVITAMIN ADULT PO) Take 1 tablet by mouth daily.    [provider]  nystatin (NYSTATIN) powder APPLY TOPICALLY THREE TIMES DAILY AS DIRECTED FOR 14 DAYS and prn yeast infection 08/19/20   Reed, Tiffany L, DO  nystatin cream (MYCOSTATIN) Apply 1 application topically 2 (two) times daily. To skin folds 03/12/20   Reed, Tiffany L, DO  omega-3 acid ethyl esters (LOVAZA) 1 g capsule TAKE ONE CAPSULE BY MOUTH EVERY DAY 08/04/16   Reed, Tiffany L, DO  oxyCODONE (ROXICODONE) 5 MG immediate release tablet Take 2 tablets (10 mg total) by mouth 3 (three) times daily as needed for severe pain. 10/01/20   Raulkar, Clide Deutscher, MD  polyvinyl alcohol (LIQUIFILM TEARS) 1.4 % ophthalmic solution Place 1 drop into both eyes 4 (four) times daily as needed (for dry/irritated eyes).     [provider]  potassium chloride  (MICRO-K) 10 MEQ  CR capsule Take 2 capsules (20 mEq total) by mouth 2 (two) times daily as needed (when taking a dose of lasix.). 06/15/20   Reed, Tiffany L, DO  sodium chloride (TGT SALINE NASAL SPRAY) 0.65 % nasal spray Place 2 sprays into the nose as needed for up to 14 days for congestion. Patient not taking: Reported on 10/14/2020 05/15/20 06/25/20  Medina-Vargas, Monina C, NP  tiotropium (SPIRIVA) 18 MCG inhalation capsule Place 1 capsule (18 mcg total) into inhaler and inhale daily. 06/15/20   Reed, Tiffany L, DO  tiZANidine (ZANAFLEX) 2 MG tablet TAKE 1 TABLET(2 MG) BY MOUTH THREE TIMES DAILY 07/13/20   Reed, Tiffany L, DO  TOUJEO SOLOSTAR 300 UNIT/ML Solostar Pen INJECT 100 UNITS INTO THE SKIN EVERY NIGHT AT BEDTIME 10/27/20   Reed, Tiffany L, DO  triamterene-hydrochlorothiazide (MAXZIDE-25) 37.5-25 MG tablet TAKE 1 TABLET BY MOUTH  DAILY 06/22/20   Reed, Tiffany L, DO  venlafaxine XR (EFFEXOR-XR) 75 MG 24 hr capsule TAKE 1 CAPSULE BY MOUTH EVERY DAY WITH BREAKFAST 09/11/20   Ngetich, Dinah C, NP  Vibegron (GEMTESA) 75 MG TABS Take 1 tablet by mouth daily. 06/15/20   Reed, Tiffany L, DO  VICTOZA 18 MG/3ML SOPN ADMINISTER 1.8 MG UNDER THE SKIN DAILY FOR BLOOD SUGAR 08/24/20   Reed, Tiffany L, DO  XELJANZ 5 MG TABS TAKE 1 TABLET BY MOUTH  DAILY 09/28/20   Ofilia Neas, PA-C    Allergies    Influenza vaccines, Penicillins, Statins, and Sulfamethoxazole-trimethoprim  Review of Systems   Review of Systems  Constitutional: Negative for chills and fever.  HENT: Negative for congestion and rhinorrhea.   Eyes: Negative for redness and visual disturbance.  Respiratory: Negative for shortness of breath and wheezing.   Cardiovascular: Negative for chest pain and palpitations.  Gastrointestinal: Negative for nausea and vomiting.  Genitourinary: Negative for dysuria and urgency.  Musculoskeletal: Positive for arthralgias. Negative for myalgias.  Skin: Negative for pallor and wound.  Neurological:  Negative for dizziness and headaches.    Physical Exam Updated Vital Signs BP (!) 144/72 (BP Location: Right Arm)   Pulse 83   Temp 98.2 F (36.8 C) (Oral)   Resp 18   Ht 5\' 6"  (1.676 m)   Wt 104.3 kg   SpO2 94%   BMI 37.12 kg/m   Physical Exam Vitals and nursing note reviewed.  Constitutional:      General: She is not in acute distress.    Appearance: She is well-developed. She is not diaphoretic.  HENT:     Head: Normocephalic and atraumatic.  Eyes:     Pupils: Pupils are equal, round, and reactive to light.  Cardiovascular:     Rate and Rhythm: Normal rate and regular rhythm.     Heart sounds: No murmur heard. No friction rub. No gallop.   Pulmonary:     Effort: Pulmonary effort is normal.     Breath sounds: No wheezing or rales.  Abdominal:     General: There is no distension.     Palpations: Abdomen is soft.     Tenderness: There is no abdominal tenderness.  Musculoskeletal:        General: Tenderness present.     Cervical back: Normal range of motion and neck supple.     Comments: Pain with range of motion of the left shoulder.  No significant pain to the left trapezius.  Pulse motor and sensation intact distally.  No pain at the elbow or the wrist or the hand.  Skin:    General: Skin is warm and dry.  Neurological:     Mental Status: She is alert and oriented to person, place, and time.  Psychiatric:        Behavior: Behavior normal.     ED Results / Procedures / Treatments   Labs (all labs ordered are listed, but only abnormal results are displayed) Labs Reviewed - No data to display  EKG None  Radiology No results found.  Procedures Procedures   Medications Ordered in ED Medications  acetaminophen (TYLENOL) tablet 1,000 mg (has no administration in time range)  oxyCODONE (Oxy IR/ROXICODONE) immediate release tablet 5 mg (has no administration in time range)    ED Course  I have reviewed the triage vital signs and the nursing  notes.  Pertinent labs & imaging results that were available during my care of the patient were reviewed by me and considered in my medical decision making (see chart for details).    MDM Rules/Calculators/A&P                          80 yo F with a chief complaints of left shoulder pain.  This is reproduced on exam.  Difficult to obtain a full exam due to patient's pain.  Could be a rotator cuff injury.  Will place in a sling.  Tylenol and diclofenac gel.  PCP follow-up.  9:51 PM:  I have discussed the diagnosis/risks/treatment options with the patient and believe the pt to be eligible for discharge home to follow-up with PCP. We also discussed returning to the ED immediately if new or worsening sx occur. We discussed the sx which are most concerning (e.g., sudden worsening pain, fever, inability to tolerate by mouth) that necessitate immediate return. Medications administered to the patient during their visit and any new prescriptions provided to the patient are listed below.  Medications given during this visit Medications  acetaminophen (TYLENOL) tablet 1,000 mg (has no administration in time range)  oxyCODONE (Oxy IR/ROXICODONE) immediate release tablet 5 mg (has no administration in time range)     The patient appears reasonably screen and/or stabilized for discharge and I doubt any other medical condition or other [facility] requiring further screening, evaluation, or treatment in the ED at this time prior to discharge.   Final Clinical Impression(s) / ED Diagnoses Final diagnoses:  Acute pain of left shoulder    Rx / DC Orders ED Discharge Orders    None       Deno Etienne, DO 11/07/20 2151

## 2020-11-07 NOTE — ED Triage Notes (Signed)
Patient states she began having posterior shoulder pain that is now radiating down to her elbow. Patient denies injury. Patient with FROM and PMS in tact.

## 2020-11-07 NOTE — Discharge Instructions (Signed)
Use the gel as prescribed Also take tylenol 1000mg (2 extra strength) four times a day.   The sling is for your comfort.  You do have to take your arm out of the sling at least 4 times a day to perform range of motion exercises.  Please return for sudden worsening pain or if you develop a fever.  Please follow-up with your doctor in about a week and hopefully they can perform a better exam with your pain is improved.

## 2020-11-09 ENCOUNTER — Other Ambulatory Visit: Payer: Self-pay | Admitting: Physician Assistant

## 2020-11-09 ENCOUNTER — Telehealth: Payer: Self-pay | Admitting: Internal Medicine

## 2020-11-09 ENCOUNTER — Telehealth: Payer: Self-pay

## 2020-11-09 NOTE — Telephone Encounter (Signed)
I called patient, Katherine Delgado prescription was sent to [facility] on 09/28/2020 for 90 day supply.

## 2020-11-09 NOTE — Telephone Encounter (Signed)
Patient called and is requesting a call back regarding the new RX she got from Dr Kelton Pillar.  Medication is Wilder Glade and she has questions about the medication and the side effects. Call back number is [phone]

## 2020-11-09 NOTE — Telephone Encounter (Signed)
Patient left a voicemail stating she received a call from [facility] informing her that they need a current or updated prescription for her Morrie Sheldon before they can fill it.

## 2020-11-09 NOTE — Telephone Encounter (Signed)
Pt wanted to make sure Katherine Delgado was not causing any incontinence issues. Pt advised incontinence is not a common known side effect of [place].

## 2020-11-10 ENCOUNTER — Other Ambulatory Visit: Payer: Self-pay | Admitting: Internal Medicine

## 2020-11-10 NOTE — Telephone Encounter (Signed)
Patient has request refill on medication "Amlodopine 10mg ". Patient last refill was 05/15/2020 with 90 tablets to be taken daily with 0 refills. Medication has warnings. Medication pend and sent to PCP Gayland Curry, DO for approval.

## 2020-11-16 ENCOUNTER — Telehealth: Payer: Self-pay | Admitting: Internal Medicine

## 2020-11-16 NOTE — Telephone Encounter (Signed)
This have already place in [facility] inbox. Will fax when completed.

## 2020-11-16 NOTE — Telephone Encounter (Signed)
[facility] called regarding the fax they sent 3/23 for a request for a CMN and chart notes for pt to receive diabetic supplies. They ask that it please be completed and sent back to them.

## 2020-11-18 NOTE — Telephone Encounter (Signed)
Faxed as requested

## 2020-11-19 DIAGNOSIS — E1142 Type 2 diabetes mellitus with diabetic polyneuropathy: Secondary | ICD-10-CM | POA: Diagnosis not present

## 2020-11-19 DIAGNOSIS — Z794 Long term (current) use of insulin: Secondary | ICD-10-CM | POA: Diagnosis not present

## 2020-11-23 ENCOUNTER — Other Ambulatory Visit: Payer: Self-pay | Admitting: Physical Medicine and Rehabilitation

## 2020-11-23 MED ORDER — OXYCODONE HCL 5 MG PO TABS: 10.0000 mg | ORAL_TABLET | Freq: Three times a day (TID) | ORAL | 0 refills | Status: DC | PRN

## 2020-11-25 ENCOUNTER — Other Ambulatory Visit: Payer: Self-pay | Admitting: Internal Medicine

## 2020-11-25 DIAGNOSIS — E039 Hypothyroidism, unspecified: Secondary | ICD-10-CM

## 2020-11-26 ENCOUNTER — Other Ambulatory Visit: Payer: Medicare Other

## 2020-12-09 ENCOUNTER — Encounter: Payer: Self-pay | Admitting: Family

## 2020-12-09 ENCOUNTER — Encounter: Payer: Medicare Other | Admitting: Physical Medicine and Rehabilitation

## 2020-12-09 ENCOUNTER — Other Ambulatory Visit: Payer: Self-pay

## 2020-12-09 ENCOUNTER — Ambulatory Visit (INDEPENDENT_AMBULATORY_CARE_PROVIDER_SITE_OTHER): Payer: Medicare Other | Admitting: Family

## 2020-12-09 VITALS — BP 100/50 | HR 60 | Temp 97.5°F | Resp 20 | Ht 66.0 in | Wt 230.4 lb

## 2020-12-09 DIAGNOSIS — B372 Candidiasis of skin and nail: Secondary | ICD-10-CM

## 2020-12-09 DIAGNOSIS — R399 Unspecified symptoms and signs involving the genitourinary system: Secondary | ICD-10-CM

## 2020-12-09 DIAGNOSIS — H9192 Unspecified hearing loss, left ear: Secondary | ICD-10-CM

## 2020-12-09 LAB — POCT URINALYSIS DIPSTICK
Bilirubin, UA: NEGATIVE
Blood, UA: NEGATIVE
Glucose, UA: NEGATIVE
Ketones, UA: NEGATIVE
Nitrite, UA: NEGATIVE
Protein, UA: POSITIVE — AB
Spec Grav, UA: 1.025 (ref 1.010–1.025)
Urobilinogen, UA: 0.2 E.U./dL
pH, UA: 5 (ref 5.0–8.0)

## 2020-12-09 MED ORDER — FLUCONAZOLE 150 MG PO TABS
150.0000 mg | ORAL_TABLET | Freq: Every day | ORAL | 0 refills | Status: AC
Start: 2020-12-09 — End: 2020-12-16

## 2020-12-09 MED ORDER — BD PEN NEEDLE MINI U/F 31G X 5 MM MISC: 0 refills | Status: DC

## 2020-12-09 MED ORDER — HUMALOG KWIKPEN 200 UNIT/ML ~~LOC~~ SOPN: 20.0000 [IU] | PEN_INJECTOR | Freq: Three times a day (TID) | SUBCUTANEOUS | 5 refills | Status: DC

## 2020-12-09 MED ORDER — SALINE NASAL SPRAY 0.65 % NA SOLN: 2.0000 | NASAL | 1 refills | Status: AC | PRN

## 2020-12-09 MED ORDER — CIPROFLOXACIN HCL 500 MG PO TABS: 500.0000 mg | ORAL_TABLET | Freq: Two times a day (BID) | ORAL | 0 refills | Status: AC

## 2020-12-09 MED ORDER — SACCHAROMYCES BOULARDII 250 MG PO CAPS: 250.0000 mg | ORAL_CAPSULE | Freq: Two times a day (BID) | ORAL | 0 refills | Status: AC

## 2020-12-09 NOTE — Patient Instructions (Addendum)
-   Start on Cipro 500 mg tablet one by mouth twice daily x 7 days  - Take along with probiotics Florastor 250 mg capsule one by mouth twice daily x 10 days to prevent antibiotics associated diarrhea   - Take Diflucan after completing the antibiotics     Skin Yeast Infection  A skin yeast infection is a condition in which there is an overgrowth of yeast (candida) that normally lives on the skin. This condition usually occurs in areas of the skin that are constantly warm and moist, such as the armpits or the groin. What are the causes? This condition is caused by a change in the normal balance of the yeast and bacteria that live on the skin. What increases the risk? You are more likely to develop this condition if you:  Are obese.  Are pregnant.  Take birth control pills.  Have diabetes.  Take antibiotic medicines.  Take steroid medicines.  Are malnourished.  Have a weak body defense system (immune system).  Are 80 years of age or older.  Wear tight clothing. What are the signs or symptoms? The most common symptom of this condition is itchiness in the affected area. Other symptoms include:  Red, swollen area of the skin.  Bumps on the skin. How is this diagnosed?  This condition is diagnosed with a medical history and physical exam.  Your health care provider may check for yeast by taking light scrapings of the skin to be viewed under a microscope. How is this treated? This condition is treated with medicine. Medicines may be prescribed or be available over the counter. The medicines may be:  Taken by mouth (orally).  Applied as a cream or powder to your skin. Follow these instructions at home:  Take or apply over-the-counter and prescription medicines only as told by your health care provider.  Maintain a healthy weight. If you need help losing weight, talk with your health care provider.  Keep your skin clean and dry.  If you have diabetes, keep your blood  sugar under control.  Keep all follow-up visits as told by your health care provider. This is important.   Contact a health care provider if:  Your symptoms go away and then return.  Your symptoms do not get better with treatment.  Your symptoms get worse.  Your rash spreads.  You have a fever or chills.  You have new symptoms.  You have new warmth or redness of your skin. Summary  A skin yeast infection is a condition in which there is an overgrowth of yeast (candida) that normally lives on the skin. This condition is caused by a change in the normal balance of the yeast and bacteria that live on the skin.  Take or apply over-the-counter and prescription medicines only as told by your health care provider.  Keep your skin clean and dry.  Contact a health care provider if your symptoms do not get better with treatment. This information is not intended to replace advice given to you by your health care provider. Make sure you discuss any questions you have with your health care provider. Document Revised: 12/26/2017 Document Reviewed: 12/26/2017 Elsevier Patient Education  [place].

## 2020-12-09 NOTE — Progress Notes (Signed)
Provider: Marlowe Sax FNP-C  Melvine Julin, Nelda Bucks, NP  Patient Care Team: Naftuli Dalsanto, Nelda Bucks, NP as PCP - General (Family Medicine) Bo Merino, MD (Rheumatology) Altheimer, Legrand Como, MD as Attending Physician (Endocrinology) Newt Minion, MD as Consulting Physician (Orthopedic Surgery)  Extended Emergency Contact Information Primary Emergency Contact: [facility] Address: [address]          [place], [place] [address] Johnnette Litter of [place] Phone: [phone] Relation: Son Secondary Emergency Contact: Marlana Latus, [place] of [place] Mobile Phone: [phone] Relation: Son  Code Status:  Full Code  Goals of care: Advanced Directive information Advanced Directives 12/09/2020  Does Patient Have a Medical Advance Directive? Yes  Type of Advance Directive -  Does patient want to make changes to medical advance directive? No - Patient declined  Copy of [place] in Chart? -  Would patient like information on creating a medical advance directive? -  Pre-existing out of facility DNR order (yellow form or pink MOST form) Pink MOST form placed in chart (order not valid for inpatient use)     Chief Complaint  Patient presents with  . Acute Visit    Itchy and red areas in groin area and under breast and burns and hurts when she urinates    HPI:  Pt is a 80 y.o. female seen today for an acute visit for evaluation of redness and itching on breast folds and groin areas x 3 days.Has used Nystatin powder and baby powder without any relief. Wears incontinent diapers and tries to change when wet.   Also states burns and hurts when she urinates x 3-4 days got worse over the weekend. She denies any abdominal pain,nausea,vomiting,fever or chills.Has chronic lower back pain which has not worsen.Appetite is good.     CBG have been well controlled 110's-130's.    Past Medical History:  Diagnosis Date  . Abnormality of  gait 04/19/2016  . Acute infective polyneuritis ([place]) 2002  . Allergic rhinitis due to pollen   . Chronic pain syndrome   . COPD (chronic obstructive pulmonary disease) (HCC)    chronic bronchitis  . Depressive disorder, not elsewhere classified   . Diabetes mellitus without complication ([place])   . Diaphragmatic hernia without mention of obstruction or gangrene   . Dyslipidemia   . Fibromyalgia   . GERD (gastroesophageal reflux disease)    with h/o esophagitis  . Guillain-Barre syndrome ([place])   . History of benign thymus tumor   . Insomnia, unspecified   . Lumbar spinal stenosis 03/30/2018   L4-5 level, severe  . Miscarriage 1962  . Mixed hyperlipidemia   . Morbid obesity ([place])   . Osteoporosis   . Pneumonia   . Polyneuropathy in diabetes(357.2)   . Rheumatoid arthritis(714.0)   . Spondylosis, lumbosacral   . Spontaneous ecchymoses   . Type II or unspecified type diabetes mellitus with peripheral circulatory disorders, uncontrolled(250.72)   . Unspecified chronic bronchitis ([place])   . Unspecified essential hypertension   . Unspecified hypothyroidism   . Unspecified pruritic disorder   . Unspecified urinary incontinence    Past Surgical History:  Procedure Laterality Date  . ABDOMINAL HYSTERECTOMY  1974  . abdominal tumor  2002  . APPENDECTOMY    . APPLICATION OF A-CELL OF BACK N/A 09/26/2018   Procedure: With [place];  Surgeon: Wallace Going, DO;  Location: [facility];  Service: Plastics;  Laterality: N/A;  . APPLICATION OF WOUND  VAC N/A 09/26/2018   Procedure: Vac placement;  Surgeon: Wallace Going, DO;  Location: [facility];  Service: Plastics;  Laterality: N/A;  . [place]  . BRONCHOSCOPY  2001  . CATARACT EXTRACTION, BILATERAL  09/2019, 10/2019  . CHOLECYSTECTOMY  1984  . INCISION AND DRAINAGE OF WOUND N/A 09/26/2018   Procedure: Debridement of spine wound;  Surgeon: Wallace Going, DO;  Location: [facility];  Service: Plastics;  Laterality: N/A;  . KNEE SURGERY  Bilateral 08/27/2010 (L) and 01/11/2011 (R)  . LUMBAR LAMINECTOMY/DECOMPRESSION MICRODISCECTOMY N/A 07/03/2018   Procedure: Lumbar Three to Lumbar Five Laminectomy;  Surgeon: Judith Part, MD;  Location: [facility];  Service: Neurosurgery;  Laterality: N/A;  . LUMBAR WOUND DEBRIDEMENT N/A 08/20/2018   Procedure: POSTERIOR LUMBAR SPINAL WOUND DEBRIDEMENT AND REVISION;  Surgeon: Judith Part, MD;  Location: [facility];  Service: Neurosurgery;  Laterality: N/A;  POSTERIOR LUMBAR SPINAL WOUND REVISION  . [place]  . OVARIAN CYST SURGERY  1968  . thymus tumor    . 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 TONSILLECTOMY      Allergies  Allergen Reactions  . Influenza Vaccines Other (See Comments)    "h/o Guillain Barre; dr's told me years ago never to take another flu shot as it could relapse Rosalee Kaufman; has to do with when vaccine being changed to H1N1 virus"  . Penicillins Hives, Itching, Swelling and Rash    Has patient had a PCN reaction causing immediate rash, facial/tongue/throat swelling, SOB or lightheadedness with hypotension:Unknown Has patient had a PCN reaction causing severe rash involving mucus membranes or skin necrosis: Unknown Has patient had a PCN reaction that required hospitalization:Unknown Has patient had a PCN reaction occurring within the last 10 years: Unknown If all of the above answers are "NO", then may proceed with Cephalosporin use.   . Statins Other (See Comments)    Myopathy, transaminitis  . Sulfamethoxazole-Trimethoprim Itching    Outpatient Encounter Medications as of 12/09/2020  Medication Sig  . acetaminophen (TYLENOL) 325 MG tablet Take 650 mg by mouth every 6 (six) hours as needed (for pain.).  Marland Kitchen amitriptyline (ELAVIL) 25 MG tablet Take 25 mg by mouth at bedtime.  Marland Kitchen amLODipine (NORVASC) 10 MG tablet TAKE 1 TABLET(10 MG) BY MOUTH DAILY  . aspirin EC 81 MG tablet Take 81 mg by mouth 2 (two) times a week.  . Chlorphen-Phenyleph-ASA (ALKA-SELTZER PLUS COLD  PO) Take by mouth as needed.  . cholecalciferol (VITAMIN D3) 25 MCG (1000 UT) tablet Take 1,000 Units by mouth daily.  . dapagliflozin propanediol (FARXIGA) 5 MG TABS tablet Take 1 tablet (5 mg total) by mouth daily before breakfast.  . docusate sodium (COLACE) 100 MG capsule Take 100 mg by mouth daily as needed (constipation.).   Marland Kitchen ezetimibe (ZETIA) 10 MG tablet Take 1 tablet (10 mg total) by mouth daily.  . folic acid (FOLVITE) 1 MG tablet TAKE 2 TABLETS BY MOUTH  DAILY  . furosemide (LASIX) 20 MG tablet TAKE 1 TABLET BY MOUTH TWICE DAILY AS NEEDED FOR 3 POUND WEIGHT GAIN IN ONE DAY OR 5 POUNDS IN ONE WEEK  . gabapentin (NEURONTIN) 600 MG tablet Take 600 mg by mouth 2 (two) times daily.  . Insulin Lispro (HUMALOG KWIKPEN) 200 UNIT/ML SOPN Inject 20 Units into the skin 3 (three) times daily.  . Insulin Pen Needle (B-D UF III MINI PEN NEEDLES) 31G X 5 MM MISC USE AS DIRECTED  . ipratropium-albuterol (DUONEB) 0.5-2.5 (3) MG/3ML  SOLN Take 3 mLs by nebulization every 6 (six) hours as needed.  Marland Kitchen levothyroxine (SYNTHROID) 112 MCG tablet Take 1 tablet (112 mcg total) by mouth daily.  Marland Kitchen lidocaine (LIDODERM) 5 % Place 1 patch onto the skin daily. Remove & Discard patch within 12 hours or as directed by MD  . lisinopril (ZESTRIL) 20 MG tablet Take one tablet by mouth once daily.  Marland Kitchen loperamide (IMODIUM A-D) 2 MG tablet Take 2 mg by mouth 4 (four) times daily as needed for diarrhea or loose stools.  . Melatonin 10 MG TABS Take 10 mg by mouth at bedtime.  . metoprolol tartrate (LOPRESSOR) 50 MG tablet Take one tablet by mouth twice daily.  . Multiple Vitamins-Minerals (MULTIVITAMIN ADULT PO) Take 1 tablet by mouth daily.  Marland Kitchen nystatin (NYSTATIN) powder APPLY TOPICALLY THREE TIMES DAILY AS DIRECTED FOR 14 DAYS and prn yeast infection  . nystatin cream (MYCOSTATIN) Apply 1 application topically 2 (two) times daily. To skin folds  . omega-3 acid ethyl esters (LOVAZA) 1 g capsule TAKE ONE CAPSULE BY MOUTH EVERY  DAY  . oxyCODONE (ROXICODONE) 5 MG immediate release tablet Take 2 tablets (10 mg total) by mouth 3 (three) times daily as needed for severe pain.  . polyvinyl alcohol (LIQUIFILM TEARS) 1.4 % ophthalmic solution Place 1 drop into both eyes 4 (four) times daily as needed (for dry/irritated eyes).   . potassium chloride (MICRO-K) 10 MEQ CR capsule Take 2 capsules (20 mEq total) by mouth 2 (two) times daily as needed (when taking a dose of lasix.).  Marland Kitchen tiotropium (SPIRIVA) 18 MCG inhalation capsule Place 1 capsule (18 mcg total) into inhaler and inhale daily.  Marland Kitchen tiZANidine (ZANAFLEX) 2 MG tablet TAKE 1 TABLET(2 MG) BY MOUTH THREE TIMES DAILY  . TOUJEO SOLOSTAR 300 UNIT/ML Solostar Pen INJECT 100 UNITS INTO THE SKIN EVERY NIGHT AT BEDTIME  . triamterene-hydrochlorothiazide (MAXZIDE-25) 37.5-25 MG tablet TAKE 1 TABLET BY MOUTH  DAILY  . venlafaxine XR (EFFEXOR-XR) 75 MG 24 hr capsule TAKE 1 CAPSULE BY MOUTH EVERY DAY WITH BREAKFAST  . Vibegron (GEMTESA) 75 MG TABS Take 1 tablet by mouth daily.  Marland Kitchen VICTOZA 18 MG/3ML SOPN ADMINISTER 1.8 MG UNDER THE SKIN DAILY FOR BLOOD SUGAR  . XELJANZ 5 MG TABS TAKE 1 TABLET BY MOUTH  DAILY  . [DISCONTINUED] sodium chloride (TGT SALINE NASAL SPRAY) 0.65 % nasal spray Place 2 sprays into the nose as needed for up to 14 days for congestion. (Patient not taking: Reported on 10/14/2020)   No facility-administered encounter medications on file as of 12/09/2020.    Review of Systems  Constitutional: Negative for appetite change, chills, fatigue, fever and unexpected weight change.  HENT: Positive for hearing loss and sinus pressure. Negative for congestion, rhinorrhea, sneezing, sore throat and trouble swallowing.        Left ear seems stuffed up   Eyes: Negative for pain, discharge, redness, itching and visual disturbance.  Respiratory: Negative for cough, chest tightness, shortness of breath and wheezing.   Cardiovascular: Negative for chest pain, palpitations and leg  swelling.  Gastrointestinal: Negative for abdominal distention, abdominal pain, blood in stool, constipation, diarrhea, nausea and vomiting.  Genitourinary: Positive for dysuria. Negative for difficulty urinating, flank pain, frequency and urgency.       Burns with urination   Musculoskeletal: Positive for arthralgias, back pain and gait problem. Negative for joint swelling, myalgias, neck pain and neck stiffness.  Skin: Negative for color change, pallor, rash and wound.  Hematological: Does not bruise/bleed  easily.  Psychiatric/Behavioral: Negative for agitation, behavioral problems, confusion, hallucinations, self-injury, sleep disturbance and suicidal ideas. The patient is not nervous/anxious.     Immunization History  Administered Date(s) Administered  . Influenza-Unspecified 06/10/2011  . PFIZER(Purple Top)SARS-COV-2 Vaccination 11/13/2019, 11/30/2019  . Pneumococcal Conjugate-13 07/07/2016  . Pneumococcal Polysaccharide-23 11/21/2003, 10/05/2017  . Tdap 05/28/2016   Pertinent  Health Maintenance Due  Topic Date Due  . OPHTHALMOLOGY EXAM  10/02/2020  . HEMOGLOBIN A1C  04/11/2021  . FOOT EXAM  10/15/2021  . DEXA SCAN  09/20/2025  . PNA vac Low Risk Adult  Completed  . INFLUENZA VACCINE  Discontinued   Fall Risk  12/09/2020 10/15/2020 10/01/2020 09/07/2020 08/27/2020  Falls in the past year? 0 0 0 0 0  Number falls in past yr: 0 0 0 0 0  Comment - - - - -  Injury with Fall? 0 0 0 0 0  Risk for fall due to : - - - - -  Follow up - - - - -   Functional Status Survey:    Vitals:   12/09/20 1040  BP: (!) 100/50  Pulse: 60  Resp: 20  Temp: (!) 97.5 F (36.4 C)  TempSrc: Temporal  SpO2: 95%  Weight: 230 lb 6.4 oz (104.5 kg)  Height: 5\' 6"  (1.676 m)   Body mass index is 37.19 kg/m. Physical Exam Vitals reviewed.  Constitutional:      General: She is not in acute distress.    Appearance: Normal appearance. She is normal weight. She is not ill-appearing or diaphoretic.   HENT:     Head: Normocephalic.     Nose: No rhinorrhea.     Mouth/Throat:     Mouth: Mucous membranes are moist.     Pharynx: Oropharynx is clear. No oropharyngeal exudate or posterior oropharyngeal erythema.  Eyes:     General: No scleral icterus.       Right eye: No discharge.        Left eye: No discharge.     Conjunctiva/sclera: Conjunctivae normal.     Pupils: Pupils are equal, round, and reactive to light.  Cardiovascular:     Rate and Rhythm: Normal rate and regular rhythm.     Pulses: Normal pulses.     Heart sounds: Normal heart sounds. No murmur heard. No friction rub. No gallop.   Pulmonary:     Effort: Pulmonary effort is normal. No respiratory distress.     Breath sounds: Normal breath sounds. No wheezing, rhonchi or rales.  Chest:     Chest wall: No tenderness.  Abdominal:     General: Bowel sounds are normal. There is no distension.     Palpations: Abdomen is soft. There is no mass.     Tenderness: There is no abdominal tenderness. There is no right CVA tenderness, left CVA tenderness, guarding or rebound.  Musculoskeletal:        General: No swelling or tenderness. Normal range of motion.     Right lower leg: No edema.     Left lower leg: No edema.  Skin:    General: Skin is warm and dry.     Coloration: Skin is not pale.     Findings: No bruising, lesion or rash.     Comments: Extensive Beefy redness to bilateral breast folds and groin areas with strong odor.   Neurological:     Mental Status: She is alert and oriented to person, place, and time.     Cranial Nerves: No cranial  nerve deficit.     Sensory: No sensory deficit.     Motor: No weakness.     Coordination: Coordination normal.     Gait: Gait normal.  Psychiatric:        Mood and Affect: Mood normal.        Speech: Speech normal.        Behavior: Behavior normal.        Thought Content: Thought content normal.        Judgment: Judgment normal.     Labs reviewed: Recent Labs     01/24/20 1148 06/11/20 0757 10/12/20 0852  NA 143 140 142  K 4.1 4.2 4.0  CL 104 100 100  CO2 29 30 30   GLUCOSE 80 127* 249*  BUN 16 21 12   CREATININE 0.84 1.11* 0.88  CALCIUM 9.5 9.9 9.8   Recent Labs    01/24/20 1148 06/11/20 0757 10/22/20 1048  AST 15 18 35  ALT 9 13 19   BILITOT 0.3 0.4 0.4  PROT 6.7 7.1 7.0   Recent Labs    06/11/20 0757 08/31/20 1412 09/10/20 1035  WBC 11.8* 18.7* 15.0*  NEUTROABS 8,520* 15,558* 11,925*  HGB 14.4 15.1 13.7  HCT 44.8 45.7* 41.8  MCV 84.8 83.5 84.6  PLT 333 422* 334   Lab Results  Component Value Date   TSH 0.04 (L) 10/12/2020   Lab Results  Component Value Date   HGBA1C 10.3 (H) 10/12/2020   Lab Results  Component Value Date   CHOL 141 10/12/2020   HDL 33 (L) 10/12/2020   LDLCALC 83 10/12/2020   TRIG 157 (H) 10/12/2020   CHOLHDL 4.3 10/12/2020    Significant Diagnostic Results in last 30 days:  No results found.  Assessment/Plan 1. Candidal skin infection Extensive erythema on breast folds and groin areas with strong odor noted.  - continue with Nystatin cream  - Advised to avoid baby powder  - Diflucan as below to take after completing Cipro for UTI  - fluconazole (DIFLUCAN) 150 MG tablet; Take 1 tablet (150 mg total) by mouth daily for 7 doses.  Dispense: 7 tablet; Refill: 0  2. Symptoms of urinary tract infection Afebrile.Reports dysuria and burning with urination. - Increase water intake to 6-8 glasses daily  - POC Urinalysis Dipstick dark cloudy urine with positive protein and Leukocytes. Made aware will send urine for culture then will call with results in 3 days.would like to go ahead and start on antibiotics.Has taken Cipro in the past. Advised to take with Probiotics.  - advised to notify provider if symptoms worsen ,running any fever or chills - ciprofloxacin (CIPRO) 500 MG tablet; Take 1 tablet (500 mg total) by mouth 2 (two) times daily for 7 days.  Dispense: 14 tablet; Refill: 0 - saccharomyces  boulardii (FLORASTOR) 250 MG capsule; Take 1 capsule (250 mg total) by mouth 2 (two) times daily for 10 days.  Dispense: 20 capsule; Refill: 0 - Urine Culture  3. Hearing loss of left ear, unspecified hearing loss type Worsening hearing left worsen than the right.No cerumen noted during visit.will refer to ENT for hearing test.  - Ambulatory referral to ENT   Family/ staff Communication: Reviewed plan of care with patient verbalized understanding   Labs/tests ordered:  - POC Urinalysis Dipstick  - Urine Culture  Next Appointment: As needed if symptoms worsen or fail to improve    Sandrea Hughs, NP

## 2020-12-10 ENCOUNTER — Other Ambulatory Visit: Payer: Self-pay | Admitting: Internal Medicine

## 2020-12-10 LAB — URINE CULTURE
MICRO NUMBER:: 11794311
Result:: NO GROWTH
SPECIMEN QUALITY:: ADEQUATE

## 2020-12-16 ENCOUNTER — Other Ambulatory Visit: Payer: Self-pay | Admitting: Physician Assistant

## 2020-12-16 NOTE — Telephone Encounter (Signed)
Next Visit: 03/25/2021  Last Visit: 10/22/2020  Last Fill: 09/28/2020  DX: Rheumatoid arthritis involving multiple sites with positive rheumatoid factor   Current Dose per office note 10/22/2020, Morrie Sheldon 5 mg 1 tablet daily.  Labs: 10/12/2020, BMP Glucose 249, HDL 33, Triglycerides 157, TSH 0.04, A1C 10.3, 09/10/2020 CBC WBC 15.0, Neutro Abs 11,925, Absolute Monocytes 1,260, Eosinophils Absolute 540  LMOM CBC w/Diff due.  TB Gold: 06/11/2020, TB gold negative.   Okay to refill Morrie Sheldon?

## 2020-12-22 ENCOUNTER — Telehealth: Payer: Self-pay | Admitting: *Deleted

## 2020-12-22 ENCOUNTER — Other Ambulatory Visit: Payer: Self-pay | Admitting: Physical Medicine and Rehabilitation

## 2020-12-22 MED ORDER — OXYCODONE HCL 5 MG PO TABS: 10.0000 mg | ORAL_TABLET | Freq: Three times a day (TID) | ORAL | 0 refills | Status: DC | PRN

## 2020-12-22 NOTE — Telephone Encounter (Signed)
Mrs Lanagan called to request a refill on her oxycodone.  Her last fill date was 11/23/20 per PMP and next appt is 01/19/21.

## 2020-12-23 ENCOUNTER — Encounter: Payer: Self-pay | Admitting: Internal Medicine

## 2020-12-23 ENCOUNTER — Ambulatory Visit (INDEPENDENT_AMBULATORY_CARE_PROVIDER_SITE_OTHER): Payer: Medicare Other | Admitting: Internal Medicine

## 2020-12-23 ENCOUNTER — Other Ambulatory Visit: Payer: Self-pay

## 2020-12-23 VITALS — BP 116/72 | HR 63 | Ht 66.0 in | Wt 230.5 lb

## 2020-12-23 DIAGNOSIS — E1142 Type 2 diabetes mellitus with diabetic polyneuropathy: Secondary | ICD-10-CM | POA: Diagnosis not present

## 2020-12-23 DIAGNOSIS — E0865 Diabetes mellitus due to underlying condition with hyperglycemia: Secondary | ICD-10-CM

## 2020-12-23 DIAGNOSIS — Z794 Long term (current) use of insulin: Secondary | ICD-10-CM

## 2020-12-23 DIAGNOSIS — E039 Hypothyroidism, unspecified: Secondary | ICD-10-CM

## 2020-12-23 LAB — POCT GLYCOSYLATED HEMOGLOBIN (HGB A1C): Hemoglobin A1C: 8.8 % — AB (ref 4.0–5.6)

## 2020-12-23 LAB — TSH: TSH: 0.77 u[IU]/mL (ref 0.35–4.50)

## 2020-12-23 LAB — POCT GLUCOSE (DEVICE FOR HOME USE): POC Glucose: 197 mg/dl — AB (ref 70–99)

## 2020-12-23 MED ORDER — LEVOTHYROXINE SODIUM 112 MCG PO TABS: 112.0000 ug | ORAL_TABLET | Freq: Every day | ORAL | 3 refills | Status: DC

## 2020-12-23 MED ORDER — HUMALOG KWIKPEN 200 UNIT/ML ~~LOC~~ SOPN: PEN_INJECTOR | SUBCUTANEOUS | 3 refills | Status: DC

## 2020-12-23 NOTE — Progress Notes (Signed)
Name: Katherine Delgado  Age/ Sex: 80 y.o., female   MRN/ DOB: [id number], [birth year]     PCP: Sandrea Hughs, NP   Reason for Endocrinology Evaluation: Type 2 Diabetes Mellitus/ hypothyriodism  Initial Endocrine Consultative Visit: 10/14/2020    PATIENT IDENTIFIER: [name] is a 80 y.o. female with a past medical history of HTN, DM, and Hypothyroidism.. The patient has followed with Endocrinology clinic since 10/14/2020 for consultative assistance with management of her diabetes.  DIABETIC HISTORY:  Ms. Legaspi was diagnosed with DM many years ago. Her hemoglobin A1c has ranged from Her A1c was ranged from 6.6% in 2019 to 10.3 % in 2022.   On her initial visit to our clinic her A1c was 10.3% .We continued MDI regimen , victoza and started [place] and was provided with a correction scale . A dexcom prescription was sent    THYROID HISTORY: She has been on LT-4 replacement for years. Was noted to have a consistently low TSH since 05/2020. Per pt has been diagnosed with MNG in the past. No prior sx or biopsies. She was  On Levothyroxine 125 mcg daily which we reduced to 112 mcg daily   SUBJECTIVE:   During the last visit (10/14/2020): We continued MDI regimen , victoza and started [place] and was provided with a correction scale . A dexcom prescription was sent . We reduced levothyroxine 125 mcg  to 112 mcg daily    Today (12/23/2020): [name] is here for a follow up on diabetes and hypothyroidism She checks her blood sugars 3 times daily, preprandial.The patient has not had hypoglycemic episodes since the last clinic visit.   Wilder Glade caused severe genital irritation  Continues with palpitations  Has been out of Humalog for ~ 1 weeks     HOME ENDOCRINE REGIMEN:  Toujeo 100 units daily  Humalog 10 units with each meal  Victoza 1.8 mg daily  Farxiga 5 mg , 1 tablet with Breakfast - not taking  - CF: Humalog ( BG- 130/20) starting at 171     METER DOWNLOAD SUMMARY: did  not bring     DIABETIC COMPLICATIONS: Microvascular complications:   neuropathy  Denies: CKD, retinopathy  Last Eye Exam: Completed 08/2020  Macrovascular complications:    Denies: CAD, CVA, PVD   HISTORY:  Past Medical History:  Past Medical History:  Diagnosis Date  . Abnormality of gait 04/19/2016  . Acute infective polyneuritis ([place]) 2002  . Allergic rhinitis due to pollen   . Chronic pain syndrome   . COPD (chronic obstructive pulmonary disease) (HCC)    chronic bronchitis  . Depressive disorder, not elsewhere classified   . Diabetes mellitus without complication ([place])   . Diaphragmatic hernia without mention of obstruction or gangrene   . Dyslipidemia   . Fibromyalgia   . GERD (gastroesophageal reflux disease)    with h/o esophagitis  . Guillain-Barre syndrome ([place])   . History of benign thymus tumor   . Insomnia, unspecified   . Lumbar spinal stenosis 03/30/2018   L4-5 level, severe  . Miscarriage 1962  . Mixed hyperlipidemia   . Morbid obesity ([place])   . Osteoporosis   . Pneumonia   . Polyneuropathy in diabetes(357.2)   . Rheumatoid arthritis(714.0)   . Spondylosis, lumbosacral   . Spontaneous ecchymoses   . Type II or unspecified type diabetes mellitus with peripheral circulatory disorders, uncontrolled(250.72)   . Unspecified chronic bronchitis ([place])   . Unspecified essential hypertension   . Unspecified hypothyroidism   .  Unspecified pruritic disorder   . Unspecified urinary incontinence     Past Surgical History:  Past Surgical History:  Procedure Laterality Date  . ABDOMINAL HYSTERECTOMY  1974  . abdominal tumor  2002  . APPENDECTOMY    . APPLICATION OF A-CELL OF BACK N/A 09/26/2018   Procedure: With Acell;  Surgeon: Wallace Going, DO;  Location: [place];  Service: Plastics;  Laterality: N/A;  . APPLICATION OF WOUND VAC N/A 09/26/2018   Procedure: Vac placement;  Surgeon: Wallace Going, DO;  Location: [place];  Service: Plastics;   Laterality: N/A;  . [place]  . BRONCHOSCOPY  2001  . CATARACT EXTRACTION, BILATERAL  09/2019, 10/2019  . CHOLECYSTECTOMY  1984  . INCISION AND DRAINAGE OF WOUND N/A 09/26/2018   Procedure: Debridement of spine wound;  Surgeon: Wallace Going, DO;  Location: [facility];  Service: Plastics;  Laterality: N/A;  . KNEE SURGERY Bilateral 08/27/2010 (L) and 01/11/2011 (R)  . LUMBAR LAMINECTOMY/DECOMPRESSION MICRODISCECTOMY N/A 07/03/2018   Procedure: Lumbar Three to Lumbar Five Laminectomy;  Surgeon: Judith Part, MD;  Location: [place];  Service: Neurosurgery;  Laterality: N/A;  . LUMBAR WOUND DEBRIDEMENT N/A 08/20/2018   Procedure: POSTERIOR LUMBAR SPINAL WOUND DEBRIDEMENT AND REVISION;  Surgeon: Judith Part, MD;  Location: [place];  Service: Neurosurgery;  Laterality: N/A;  POSTERIOR LUMBAR SPINAL WOUND REVISION  . [place]  . OVARIAN CYST SURGERY  1968  . thymus tumor    . 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 TONSILLECTOMY       Social History:  reports that she quit smoking about 41 years ago. Her smoking use included cigarettes. She has a 1.00 pack-year smoking history. She has never used smokeless tobacco. She reports that she does not drink alcohol and does not use drugs. Family History:  Family History  Problem Relation Age of Onset  . Alzheimer's disease Mother   . Heart disease Mother   . Heart disease Father   . Liver disease Father   . Cancer Brother   . Arthritis Son   . Colon polyps Brother   . Colon cancer Neg Hx   . Esophageal cancer Neg Hx   . Kidney disease Neg Hx   . Stomach cancer Neg Hx   . Rectal cancer Neg Hx       HOME MEDICATIONS: Allergies as of 12/23/2020      Reactions   Influenza Vaccines Other (See Comments)   "h/o Guillain Barre; dr's told me years ago never to take another flu shot as it could relapse Rosalee Kaufman; has to do with when vaccine being changed to H1N1 virus"   Penicillins Hives, Itching, Swelling, Rash   Has patient  had a PCN reaction causing immediate rash, facial/tongue/throat swelling, SOB or lightheadedness with hypotension:Unknown Has patient had a PCN reaction causing severe rash involving mucus membranes or skin necrosis: Unknown Has patient had a PCN reaction that required hospitalization:Unknown Has patient had a PCN reaction occurring within the last 10 years: Unknown If all of the above answers are "NO", then may proceed with Cephalosporin use.   Statins Other (See Comments)   Myopathy, transaminitis   Sulfamethoxazole-trimethoprim Itching      Medication List       Accurate as of Dec 23, 2020  7:33 AM. If you have any questions, ask your nurse or doctor.        acetaminophen 325 MG tablet Commonly known as: TYLENOL Take 650 mg by  mouth every 6 (six) hours as needed (for pain.).   ALKA-SELTZER PLUS COLD PO Take by mouth as needed.   amitriptyline 25 MG tablet Commonly known as: ELAVIL Take 25 mg by mouth at bedtime.   amLODipine 10 MG tablet Commonly known as: NORVASC TAKE 1 TABLET(10 MG) BY MOUTH DAILY   aspirin EC 81 MG tablet Take 81 mg by mouth 2 (two) times a week.   B-D UF III MINI PEN NEEDLES 31G X 5 MM Misc Generic drug: Insulin Pen Needle USE AS DIRECTED   cholecalciferol 25 MCG (1000 UNIT) tablet Commonly known as: VITAMIN D3 Take 1,000 Units by mouth daily.   dapagliflozin propanediol 5 MG Tabs tablet Commonly known as: Farxiga Take 1 tablet (5 mg total) by mouth daily before breakfast.   docusate sodium 100 MG capsule Commonly known as: COLACE Take 100 mg by mouth daily as needed (constipation.).   ezetimibe 10 MG tablet Commonly known as: ZETIA Take 1 tablet (10 mg total) by mouth daily.   folic acid 1 MG tablet Commonly known as: FOLVITE TAKE 2 TABLETS BY MOUTH  DAILY   furosemide 20 MG tablet Commonly known as: LASIX TAKE 1 TABLET BY MOUTH TWICE DAILY AS NEEDED FOR 3 POUND WEIGHT GAIN IN ONE DAY OR 5 POUNDS IN ONE WEEK   gabapentin 600 MG  tablet Commonly known as: NEURONTIN TAKE 1 TABLET(600 MG) BY MOUTH THREE TIMES DAILY   Gemtesa 75 MG Tabs Generic drug: Vibegron Take 1 tablet by mouth daily.   HumaLOG KwikPen 200 UNIT/ML KwikPen Generic drug: insulin lispro Inject 20 Units into the skin 3 (three) times daily.   ipratropium-albuterol 0.5-2.5 (3) MG/3ML Soln Commonly known as: DUONEB Take 3 mLs by nebulization every 6 (six) hours as needed.   levothyroxine 112 MCG tablet Commonly known as: SYNTHROID Take 1 tablet (112 mcg total) by mouth daily.   lidocaine 5 % Commonly known as: LIDODERM Place 1 patch onto the skin daily. Remove & Discard patch within 12 hours or as directed by MD   lisinopril 20 MG tablet Commonly known as: ZESTRIL Take one tablet by mouth once daily.   loperamide 2 MG tablet Commonly known as: IMODIUM A-D Take 2 mg by mouth 4 (four) times daily as needed for diarrhea or loose stools.   Melatonin 10 MG Tabs Take 10 mg by mouth at bedtime.   metoprolol tartrate 50 MG tablet Commonly known as: LOPRESSOR Take one tablet by mouth twice daily.   MULTIVITAMIN ADULT PO Take 1 tablet by mouth daily.   nystatin cream Commonly known as: MYCOSTATIN Apply 1 application topically 2 (two) times daily. To skin folds   nystatin powder Commonly known as: nystatin APPLY TOPICALLY THREE TIMES DAILY AS DIRECTED FOR 14 DAYS and prn yeast infection   omega-3 acid ethyl esters 1 g capsule Commonly known as: LOVAZA TAKE ONE CAPSULE BY MOUTH EVERY DAY   oxyCODONE 5 MG immediate release tablet Commonly known as: Roxicodone Take 2 tablets (10 mg total) by mouth 3 (three) times daily as needed for severe pain.   polyvinyl alcohol 1.4 % ophthalmic solution Commonly known as: LIQUIFILM TEARS Place 1 drop into both eyes 4 (four) times daily as needed (for dry/irritated eyes).   potassium chloride 10 MEQ CR capsule Commonly known as: MICRO-K Take 2 capsules (20 mEq total) by mouth 2 (two) times daily  as needed (when taking a dose of lasix.).   sodium chloride 0.65 % nasal spray Commonly known as: OCEAN Place 2  sprays into the nose as needed for congestion.   tiotropium 18 MCG inhalation capsule Commonly known as: SPIRIVA Place 1 capsule (18 mcg total) into inhaler and inhale daily.   tiZANidine 2 MG tablet Commonly known as: ZANAFLEX TAKE 1 TABLET(2 MG) BY MOUTH THREE TIMES DAILY   Toujeo SoloStar 300 UNIT/ML Solostar Pen Generic drug: insulin glargine (1 Unit Dial) INJECT 100 UNITS INTO THE SKIN EVERY NIGHT AT BEDTIME   triamterene-hydrochlorothiazide 37.5-25 MG tablet Commonly known as: MAXZIDE-25 TAKE 1 TABLET BY MOUTH  DAILY   venlafaxine XR 75 MG 24 hr capsule Commonly known as: EFFEXOR-XR TAKE 1 CAPSULE BY MOUTH EVERY DAY WITH BREAKFAST   Victoza 18 MG/3ML Sopn Generic drug: liraglutide ADMINISTER 1.8 MG UNDER THE SKIN DAILY FOR BLOOD SUGAR   Xeljanz 5 MG Tabs Generic drug: Tofacitinib Citrate TAKE 1 TABLET BY MOUTH  DAILY        OBJECTIVE:   Vital Signs: There were no vitals taken for this visit.  Wt Readings from Last 3 Encounters:  12/09/20 230 lb 6.4 oz (104.5 kg)  11/07/20 230 lb (104.3 kg)  10/22/20 230 lb (104.3 kg)     Exam: General: Pt appears well and is in NAD  Hydration: Well-hydrated with moist mucous membranes and good skin turgor  HEENT: Head: Unremarkable with good dentition. Oropharynx clear without exudate.  Eyes: External eye exam normal without stare, lid lag or exophthalmos.  EOM intact.  PERRL.  Neck: General: Supple without adenopathy. Thyroid: Thyroid size normal.  No goiter or nodules appreciated. No thyroid bruit.  Lungs: Clear with good BS bilat with no rales, rhonchi, or wheezes  Heart: RRR with normal S1 and S2 and no gallops; no murmurs; no rub  Abdomen: Normoactive bowel sounds, soft, nontender, without masses or organomegaly palpable  Extremities: No pretibial edema. No tremor. Normal strength and motion throughout.  See detailed diabetic foot exam below.  Skin: Normal texture and temperature to palpation. No rash noted. No Acanthosis nigricans/skin tags. No lipohypertrophy.  Neuro: MS is good with appropriate affect, pt is alert and Ox3    DM foot exam: Please see diabetic assessment flow-sheet detailed below:           DATA REVIEWED:  Lab Results  Component Value Date   HGBA1C 10.3 (H) 10/12/2020   HGBA1C 8.4 (H) 06/11/2020   HGBA1C 7.7 (H) 02/10/2020   10/12/20 BUN/Cr 12/0.88 GFR 62 HDL 33 LDL 83 Tg 157 T. Chol 141 TSH 0.04 uIU/mL  A1c 10.3 Results for MABREY, [place] (MRN [id number]) as of 12/23/2020 12:18  Ref. Range 12/23/2020 08:01  TSH Latest Ref Range: 0.35 - 4.50 uIU/mL 0.77   ASSESSMENT / PLAN / RECOMMENDATIONS:   1) Type 2 Diabetes Mellitus, Poorly controlled, With  Neuropathic complications - Most recent A1c of 8.8 %. Goal A1c < 7.5 %.    -  A1c down from 10.3%  - Intolerant to SGLT-2 inhibitors due to severe genital irritations  - Needs a refill on Humalog - Needs training on Dexcom, a referral to our CDE has been initiated      MEDICATIONS: - Continue Toujeo 100 units daily  - Increase  Humalog to  12 units with each meal  - Continue Victoza 1.8 mg daily  - CF : Humalog ( BG - 150/20)    EDUCATION / INSTRUCTIONS:  BG monitoring instructions: Patient is instructed to check her blood sugars 3 times a day, before meals .  Call [facility] if: BG persistently <  70  . I reviewed the Rule of 15 for the treatment of hypoglycemia in detail with the patient. Literature supplied.   2) Diabetic complications:   Eye: Does not have known diabetic retinopathy.   Neuro/ Feet: Does  have known diabetic peripheral neuropathy .   Renal: Patient does not have known baseline CKD.    3) Hypothyroidism:   - Repeat TSH is normal , will continue the current dose   Medication Continue Levothyroxine 112 mcg daily     F/U in 4 months     Signed  electronically by: Mack Guise, MD  [facility]  [facility] [address] [place], [place] [address] Phone: [phone] FAX: [phone]   CC: Sandrea Hughs, NP [place] [address] Phone: [phone]  Fax: [phone]  Return to Endocrinology clinic as below: Future Appointments  Date Time Provider [place]  12/23/2020  7:50 AM Geanie Pacifico, Melanie Crazier, MD [facility] None  01/14/2021  9:30 AM Aunisty Reali, Melanie Crazier, MD [facility] None  01/19/2021  9:40 AM Raulkar, Clide Deutscher, MD [facility] CPR  02/09/2021  8:45 AM [facility] None  02/10/2021 11:00 AM Ngetich, Nelda Bucks, NP [facility] None  02/12/2021 10:00 AM Ngetich, Nelda Bucks, NP [facility] None  03/25/2021 10:15 AM Bo Merino, MD [facility] None

## 2020-12-23 NOTE — Patient Instructions (Signed)
-   Keep Up the Good Work ! A1c down from 10.3% TO 8.8% - Continue Toujeo at 100 units daily  - Increase  Humalog to  12 units with each meal  - Continue Victoza 1.8 mg daily   - Humalog correctional insulin: ADD extra units on insulin to your meal-time humalog dose if your blood sugars are higher than 170. Use the scale below to help guide you:   Blood sugar before meal Number of units to inject  Less than 170 0 unit  171 -  190 2 units  191 -  210 3 units  211 -  230 4 units  231 -  250 5 units  251 -  270 6 units  271 -  290 7 units  291 -  310 8 units  311- 330 9 units  331- 350 10 units  351 - 370 11 units   371 - 390 12 units           HOW TO TREAT LOW BLOOD SUGARS (Blood sugar LESS THAN 70 MG/DL)  Please follow the RULE OF 15 for the treatment of hypoglycemia treatment (when your (blood sugars are less than 70 mg/dL)    STEP 1: Take 15 grams of carbohydrates when your blood sugar is low, which includes:   3-4 GLUCOSE TABS  OR  3-4 OZ OF JUICE OR REGULAR SODA OR  ONE TUBE OF GLUCOSE GEL     STEP 2: RECHECK blood sugar in 15 MINUTES STEP 3: If your blood sugar is still low at the 15 minute recheck --> then, go back to STEP 1 and treat AGAIN with another 15 grams of carbohydrates.

## 2020-12-24 ENCOUNTER — Other Ambulatory Visit: Payer: Self-pay | Admitting: Physician Assistant

## 2020-12-27 ENCOUNTER — Other Ambulatory Visit: Payer: Self-pay | Admitting: Physical Medicine and Rehabilitation

## 2020-12-29 ENCOUNTER — Telehealth: Payer: Self-pay | Admitting: Nutrition

## 2020-12-29 NOTE — Telephone Encounter (Signed)
Appointment scheduled for Dexcom and diabetes education

## 2020-12-30 ENCOUNTER — Other Ambulatory Visit: Payer: Self-pay

## 2020-12-30 ENCOUNTER — Encounter: Payer: Medicare Other | Attending: Internal Medicine | Admitting: Nutrition

## 2020-12-30 DIAGNOSIS — E119 Type 2 diabetes mellitus without complications: Secondary | ICD-10-CM | POA: Insufficient documentation

## 2020-12-30 NOTE — Progress Notes (Signed)
Patient is here with her son to learn using the Dexcom.  They were trained on how/when to apply it, how to attach the transmitter and how/when to remove and reuse the transmitter.  Transmitter was linked to the reader and started without difficulty in the office.   Questions were answered about diet and balancing meals.

## 2021-01-06 NOTE — Patient Instructions (Signed)
Apply a new transmitter every 3 months Apply a new sensor every 10 days. Call Dexcom help line if questions.

## 2021-01-11 ENCOUNTER — Other Ambulatory Visit: Payer: Self-pay | Admitting: *Deleted

## 2021-01-11 NOTE — Telephone Encounter (Signed)
Pharmacy requested refill.  Pended Rx and sent to [facility] for approval (Din[place] out of office.

## 2021-01-12 MED ORDER — TIZANIDINE HCL 2 MG PO TABS: ORAL_TABLET | ORAL | 1 refills | Status: DC

## 2021-01-14 ENCOUNTER — Ambulatory Visit: Payer: Medicare Other | Admitting: Internal Medicine

## 2021-01-15 ENCOUNTER — Encounter: Payer: Medicare Other | Admitting: Family

## 2021-01-19 ENCOUNTER — Encounter: Payer: Self-pay | Admitting: Physical Medicine and Rehabilitation

## 2021-01-19 ENCOUNTER — Other Ambulatory Visit: Payer: Self-pay

## 2021-01-19 ENCOUNTER — Telehealth: Payer: Self-pay | Admitting: *Deleted

## 2021-01-19 ENCOUNTER — Encounter
Payer: Medicare Other | Attending: Physical Medicine and Rehabilitation | Admitting: Physical Medicine and Rehabilitation

## 2021-01-19 VITALS — BP 129/79 | HR 72 | Temp 98.5°F | Ht 66.0 in | Wt 225.0 lb

## 2021-01-19 DIAGNOSIS — G894 Chronic pain syndrome: Secondary | ICD-10-CM | POA: Insufficient documentation

## 2021-01-19 DIAGNOSIS — G4701 Insomnia due to medical condition: Secondary | ICD-10-CM

## 2021-01-19 DIAGNOSIS — Z79899 Other long term (current) drug therapy: Secondary | ICD-10-CM

## 2021-01-19 MED ORDER — OXYCODONE-ACETAMINOPHEN 5-325 MG PO TABS: 1.0000 | ORAL_TABLET | Freq: Four times a day (QID) | ORAL | 0 refills | Status: DC | PRN

## 2021-01-19 NOTE — Telephone Encounter (Signed)
Patient and son walked into office regarding WheelChair order. Stated that they have not received her wheelchair and it has been since last year.   Order was sent to [facility] in 02/10/2020 and 10/15/2020  I called [employer] and spoke with Barnett Applebaum [phone] And she stated that the order was voided on 10/27/2020 because they have been trying to collect a balance of $10.63 AND the copay of the Wheelchair. And it has still not been collected.   I called the patient and son back to a room and I discussed this with patient's son and patient. Gave them the Number to [employer]. They stated that they will call them and take care of this.  Stated that they will also need a 31" chair when they do bring one out to fit through their doors in home but they will discuss this with them.

## 2021-01-19 NOTE — Progress Notes (Addendum)
Subjective:    Patient ID: Katherine Delgado, female    DOB: 1941-01-15, 80 y.o.   MRN: [id number]  HPI  Katherine Delgado is a 80 year old woman who presents for f/u of her bilateral knee OA, hip OA, lower back pain, constipation, rehumatoid arthritis  1) Congestion: She is having cold symptoms, head congestion. She has Vicks cough and cold. She has been drinking liquids.   2) Bilateral knee pain: She has been using Oxycodone 5mg  tablets twice 3 times per day- she has been trying to take it twice per day. Sometimes she needs a 4th at night. She asks about the prednisone we had discussed last visit. She would like to try the viscosupplementation next visit. She has had this in the past with temporary relief. She has not had any recent imaging of her knees.  -pain has been worse since last visit.  -she is not sure how much injections will help since her arthritis is bone on bone on her knees.  -she has tried percocet in the psat.   3) Insomnia: She has been taking Amitriptyline 25mg  which provides her with about three hours of sleep.The Amitriptyline has been helping. She does not get time outside during the day. We had cut back from 50 because it was making her too groggy.   4) Constipation: well controlled with medication. She is having a BM every day.   5) Type 2 DM: Her sugars have been well controlled recently.   Prior history: She has been taking three Norco per day and finds this helps but wanted to see if she can take anything stronger. She has pain in her back as well as throughout her joints due to her autoimmune conditions. She feels that physically she is getting worse in term of her conditioning. She does not take steroid currently. She has taken Prednisone in the past with good benefit. Her son is also present for the appointment. A side effect she has had with prednisone is elevated blood sugar- she is already on several medications for diabetes.   Prior history:  Pain is constant.  Medication helps but it does not go away. Pain can be in arms, leg, chest. It tends to be worst in back, hips, and legs. Discussed trigger point injections which she would like to try.   She has never tried blue emu oil. She has tried Lidocaine patches in the past.   She is almost never walking due to her pain. She is not doing much walking at home either. She can only stand 5 minutes before she has to sit down.   Prior history:  Her cold symptoms are getting better but she did not spread germs to our office members.   Her pain has worsened as a result of her cold, but she does feel that the medication is helping. She would like to maintain the same dose.  She has been eating less and drinking more water and as a result has lost weight and her CBGs are better controlled.  Constipation has been well controlled with Colace.   Her pain has been quite severe. She is feeling better. Her average pain is 8/10. She has been to do a little bit more around the house. She is able to fold clothes when her son brings them in. She has not been able to walk much more. She does walk within the house, to go to the bathroom. She is limited by the knee pain and the radiating back  pain, as well as her decreased strength. She has never had an epidural injection into the back.   Discussed the results of her lumbar MRI with her: Abnormal MRI lumbar spine (without) demonstrating: 1. At L4-5: disc bulging, disc protrusion, facet and ligamentum flavum hypertrophy, resulting in severe spinal stenosis and severe biforaminal stenosis. Also degenerative endplate and marrow edema at L4-5 with STIR hyperintense signal. 2. At L3-4: disc bulging, facet hypertrophy with moderate-severe spinal stenosis and severe biforaminal stenosis. 3. At L5-S1: loss of disc space height, facet hypertrophy, left hemi-laminectomy, with no spinal stenosis or foraminal narrowing. 4. Compared to MRI on 10/19/12, there has been significant worsening  of degenerative spine disease, especially at L4-5 level.  She had surgery on November 12th, 2019. This did help her and she wen through rehab afterward. But now she feels worse than before the surgery.    Pain Inventory Average Pain 8 Pain Right Now 7 My pain is sharp, dull, tingling and aching  In the last 24 hours, has pain interfered with the following? General activity 10 Relation with others 10  Enjoyment of life 10 What TIME of day is your pain at its worst? daytime and night Sleep (in general) Poor  Pain is worse with: walking, bending, standing, unsure and some activites Pain improves with: medication Relief from Meds: 5  Family History  Problem Relation Age of Onset  . Alzheimer's disease Mother   . Heart disease Mother   . Heart disease Father   . Liver disease Father   . Cancer Brother   . Arthritis Son   . Colon polyps Brother   . Colon cancer Neg Hx   . Esophageal cancer Neg Hx   . Kidney disease Neg Hx   . Stomach cancer Neg Hx   . Rectal cancer Neg Hx    Social History   Socioeconomic History  . Marital status: Widowed    Spouse name: Not on file  . Number of children: 2  . Years of education: 47  . Highest education level: Not on file  Occupational History  . Occupation: Retired  Tobacco Use  . Smoking status: Former Smoker    Packs/day: 0.10    Years: 10.00    Pack years: 1.00    Types: Cigarettes    Quit date: 12/07/1979    Years since quitting: 41.1  . Smokeless tobacco: Never Used  Vaping Use  . Vaping Use: Never used  Substance and Sexual Activity  . Alcohol use: No    Alcohol/week: 0.0 standard drinks  . Drug use: No  . Sexual activity: Not Currently  Other Topics Concern  . Not on file  Social History Narrative   Walks with cane   Right handed    Caffeine use: Coffee (2 cups every morning)   Tea: sometimes   Soda: none   Social Determinants of Radio broadcast assistant Strain: Not on file  Food Insecurity: Not on file   Transportation Needs: Not on file  Physical Activity: Not on file  Stress: Not on file  Social Connections: Not on file   Past Surgical History:  Procedure Laterality Date  . ABDOMINAL HYSTERECTOMY  1974  . abdominal tumor  2002  . APPENDECTOMY    . APPLICATION OF A-CELL OF BACK N/A 09/26/2018   Procedure: With Acell;  Surgeon: Wallace Going, DO;  Location: [place];  Service: Plastics;  Laterality: N/A;  . APPLICATION OF WOUND VAC N/A 09/26/2018   Procedure: Vac placement;  Surgeon: Wallace Going, DO;  Location: [place];  Service: Plastics;  Laterality: N/A;  . [place]  . BRONCHOSCOPY  2001  . CATARACT EXTRACTION, BILATERAL  09/2019, 10/2019  . CHOLECYSTECTOMY  1984  . INCISION AND DRAINAGE OF WOUND N/A 09/26/2018   Procedure: Debridement of spine wound;  Surgeon: Wallace Going, DO;  Location: [place];  Service: Plastics;  Laterality: N/A;  . KNEE SURGERY Bilateral 08/27/2010 (L) and 01/11/2011 (R)  . LUMBAR LAMINECTOMY/DECOMPRESSION MICRODISCECTOMY N/A 07/03/2018   Procedure: Lumbar Three to Lumbar Five Laminectomy;  Surgeon: Judith Part, MD;  Location: [place];  Service: Neurosurgery;  Laterality: N/A;  . LUMBAR WOUND DEBRIDEMENT N/A 08/20/2018   Procedure: POSTERIOR LUMBAR SPINAL WOUND DEBRIDEMENT AND REVISION;  Surgeon: Judith Part, MD;  Location: [place];  Service: Neurosurgery;  Laterality: N/A;  POSTERIOR LUMBAR SPINAL WOUND REVISION  . [place]  . OVARIAN CYST SURGERY  1968  . thymus tumor    . 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 TONSILLECTOMY     Past Surgical History:  Procedure Laterality Date  . ABDOMINAL HYSTERECTOMY  1974  . abdominal tumor  2002  . APPENDECTOMY    . APPLICATION OF A-CELL OF BACK N/A 09/26/2018   Procedure: With Acell;  Surgeon: Wallace Going, DO;  Location: [place];  Service: Plastics;  Laterality: N/A;  . APPLICATION OF WOUND VAC N/A 09/26/2018   Procedure: Vac placement;  Surgeon: Wallace Going, DO;   Location: [place];  Service: Plastics;  Laterality: N/A;  . [place]  . BRONCHOSCOPY  2001  . CATARACT EXTRACTION, BILATERAL  09/2019, 10/2019  . CHOLECYSTECTOMY  1984  . INCISION AND DRAINAGE OF WOUND N/A 09/26/2018   Procedure: Debridement of spine wound;  Surgeon: Wallace Going, DO;  Location: [place];  Service: Plastics;  Laterality: N/A;  . KNEE SURGERY Bilateral 08/27/2010 (L) and 01/11/2011 (R)  . LUMBAR LAMINECTOMY/DECOMPRESSION MICRODISCECTOMY N/A 07/03/2018   Procedure: Lumbar Three to Lumbar Five Laminectomy;  Surgeon: Judith Part, MD;  Location: [place];  Service: Neurosurgery;  Laterality: N/A;  . LUMBAR WOUND DEBRIDEMENT N/A 08/20/2018   Procedure: POSTERIOR LUMBAR SPINAL WOUND DEBRIDEMENT AND REVISION;  Surgeon: Judith Part, MD;  Location: [place];  Service: Neurosurgery;  Laterality: N/A;  POSTERIOR LUMBAR SPINAL WOUND REVISION  . [place]  . OVARIAN CYST SURGERY  1968  . thymus tumor    . 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 TONSILLECTOMY     Past Medical History:  Diagnosis Date  . Abnormality of gait 04/19/2016  . Acute infective polyneuritis ([place]) 2002  . Allergic rhinitis due to pollen   . Chronic pain syndrome   . COPD (chronic obstructive pulmonary disease) (HCC)    chronic bronchitis  . Depressive disorder, not elsewhere classified   . Diabetes mellitus without complication ([place])   . Diaphragmatic hernia without mention of obstruction or gangrene   . Dyslipidemia   . Fibromyalgia   . GERD (gastroesophageal reflux disease)    with h/o esophagitis  . Guillain-Barre syndrome ([place])   . History of benign thymus tumor   . Insomnia, unspecified   . Lumbar spinal stenosis 03/30/2018   L4-5 level, severe  . Miscarriage 1962  . Mixed hyperlipidemia   . Morbid obesity ([place])   . Osteoporosis   . Pneumonia   . Polyneuropathy in diabetes(357.2)   . Rheumatoid arthritis(714.0)   . Spondylosis, lumbosacral   . Spontaneous  ecchymoses   . Type II or  unspecified type diabetes mellitus with peripheral circulatory disorders, uncontrolled(250.72)   . Unspecified chronic bronchitis ([place])   . Unspecified essential hypertension   . Unspecified hypothyroidism   . Unspecified pruritic disorder   . Unspecified urinary incontinence    BP 129/79   Pulse 72   Temp 98.5 F (36.9 C)   Ht 5\' 6"  (1.676 m)   Wt 225 lb (102.1 kg)   SpO2 92%   BMI 36.32 kg/m   Opioid Risk Score:   Fall Risk Score:  `1  Depression screen PHQ 2/9  Depression screen [facility] 2/9 10/15/2020 10/01/2020 07/23/2020 06/19/2020 06/15/2020 03/12/2020 02/19/2020  Decreased Interest 0 0 0 0 0 0 0  Down, Depressed, Hopeless 0 0 0 0 0 0 0  PHQ - 2 Score 0 0 0 0 0 0 0  Altered sleeping - - - - - - 0  Tired, decreased energy - - - - - - 0  Change in appetite - - - - - - 0  Feeling bad or failure about yourself  - - - - - - 0  Trouble concentrating - - - - - - 0  Moving slowly or fidgety/restless - - - - - - 0  Suicidal thoughts - - - - - - 0  PHQ-9 Score - - - - - - 0  Difficult doing work/chores - - - - - - -  Some recent data might be hidden   Review of Systems  Constitutional: Positive for fatigue.  HENT: Negative.   Eyes: Negative.   Respiratory: Negative.   Cardiovascular: Positive for leg swelling.  Gastrointestinal: Negative.   Endocrine: Negative.   Genitourinary: Negative.   Musculoskeletal: Positive for arthralgias, back pain and gait problem.  Skin: Negative.   Allergic/Immunologic: Negative.   Neurological: Positive for weakness and numbness.       Tingling  Hematological: Negative.   Psychiatric/Behavioral: Negative.        Objective:   Physical Exam  Gen: no distress, normal appearing, BMI 36.32, son accompanies.  HEENT: oral mucosa pink and moist, NCAT, hard of hearing.  Cardio: Reg rate Chest: normal effort, normal rate of breathing Abd: soft, non-distended Ext: no edema Psych: pleasant, normal affect Skin: intact Neuro: Alert and oriented  x3 Musculoskeletal: Multiple myofascial trigger points throughout lumbar and cervical paraspinals. Tenderness to palpation in bilateral knees along medial and lateral joint lines. Requires wheelchair transport. Tenderness to palpation in bilateral hips.      Assessment & Plan:  Mrs. Acklin is a 80 year old woman who presents for f/u of chronic pain syndrome, failed back syndrome, lumbar stenosis, bilateral knee osteoarthritis, bilateral hip OA, severe insomnia, and constipation.   1) Severe insomnia: Amitriptyline is helping. No side effects.Has had no falls. Discussed the side effect of wooziness and increased fall risk when taking this medication. -Discussed the benefits of time outdoors as early in the day as possible to help reset her circadian rhythm.  -decrease amitriptyline to 25mg .   2) Chronic pain syndrome secondary to lumbar spinal stenosis, failed back syndrome, and bilateral knee osteoarthritis that no longer benefits from injections.  Pain contract previously signed and UDS obtained. Discussed role of exercise and anti-inflammatory diet in pain relief. Recommended exercise bike given limited mobility and low carb diet. Reviewed the results of MRI with her (as in HPI). She has made good dietary changes. The medication has been helping. I have prescribed Lidocaine patches to use daily, blue emu  oil OTC. Did not get much benefit from trigger point injections.   -ADDENDUM 6/1 (day after yesterday's visit): I misunderstood the way patient was taking oxycodone during visit: I thought she was taking 5mg  3-4 times per day, but she was taking 10mg  (two tablets), which is what we discussed. Yesterday I sent script for percocet 5mg -325 4 times per day as needed and she picked this up and understandably did not find this helpful since this was half the dose she was taking, so she called me about this today. I recommended that she take 2 percocet 5mg  up to 4 times per day as needed. She should run  out of this medication in 15 days. I will put in a new order for percocet 10mg  up to 4 times per day that can be released in 14 days.  -Her pain is worst at night.  -XR results reviewed with patient.  -s/p viscosupplementation x1 -Discussed following foods that may reduce pain: 1) Ginger (especially studied for arthritis)- reduce leukotriene production to decrease inflammation 2) Blueberries- high in phytonutrients that decrease inflammation 3) Salmon- marine omega-3s reduce joint swelling and pain 4) Pumpkin seeds- reduce inflammation 5) dark chocolate- reduces inflammation 6) turmeric- reduces inflammation 7) tart cherries - reduce pain and stiffness 8) extra virgin olive oil - its compound olecanthal helps to block prostaglandins  9) chili peppers- can be eaten or applied topically via capsaicin 10) mint- helpful for headache, muscle aches, joint pain, and itching 11) garlic- reduces inflammation  Link to further information on diet for chronic pain: http://www.randall.com/    Turmeric to reduce inflammation--can be used in cooking or taken as a supplement.  Benefits of turmeric:  -Highly anti-inflammatory  -Increases antioxidants  -Improves memory, attention, brain disease  -Lowers risk of heart disease  -May help prevent cancer  -Decreases pain  -Alleviates depression  -Delays aging and decreases risk of chronic disease  -Consume with black pepper to increase absorption   Turmeric Milk Recipe:  1 cup milk  1 tsp turmeric  1 tsp cinnamon  1 tsp grated ginger (optional)  Black pepper (boosts the anti-inflammatory properties of turmeric).  1 tsp honey   3) Obesity class 2: Monitor each visit. Can contribute to chronic disease and worsening pain. Lifestyle changes as above. Weight today is 236 lbs, this is down 10 lbs in the past two months. Commended on this.   4) Incontinence of  bladder. Discussed timed voiding q2H. Cranberry supplements won't hurt, but help more for UTIs. Can continue Myrbetriq. Discussed that constipation could be a factor.   5) Congestive heart failure: educated that purpose of Lasix is to treat CHF and Myrbetric to treat urinary retention. Advised to follow-up with Dr. Mariea Clonts given her increased weight since last visit, which she attributes to fluid retention. She may benefit from a higher dose of Lasix.   6) Constipation:  Continue colace which is helping constipation.   7) Type 2 DM: Commended on progress! Glucose has been stable- she has been eating less sugar and drinking more water. Discussed how steroids can increase CBGs  8) Impaired mobility and ADLs.: Encouraged to get out of the wheelchair every hour to increase blood flow, maintain muscle strength, and to prevent pressure injury. Discussed home therapies.  -Her mobility continues to be quite impaired. She would like to be able to get out of her wheelchair more frequently.   9) Autoimmune conditions: -Discussed that steroids can be very helpful for this. Discussed the side effects of steroids.  Will consider next visit as not recommended during an active infection given suppressed immune system  10) Viral upper respiratory infection: She has contacted her PCP. Continue Mucinex, cough syrup as needed. Recommended hot water with honey, lime, and ginger. Afebrile.   11) Bilateral hip OA: XRs ordered. Discussed risks and benefits of steroids injections.  12) Myofascial pain, cervical and lumbar: has had trigger point injections before  F/u Zella Ball next 3 months, then me in 4 months

## 2021-01-20 ENCOUNTER — Telehealth: Payer: Self-pay | Admitting: *Deleted

## 2021-01-20 DIAGNOSIS — D72829 Elevated white blood cell count, unspecified: Secondary | ICD-10-CM

## 2021-01-20 LAB — CBC WITH DIFFERENTIAL/PLATELET
Absolute Monocytes: 1106 cells/uL — ABNORMAL HIGH (ref 200–950)
Basophils Absolute: 126 cells/uL (ref 0–200)
Basophils Relative: 0.8 %
Eosinophils Absolute: 600 cells/uL — ABNORMAL HIGH (ref 15–500)
Eosinophils Relative: 3.8 %
HCT: 44.4 % (ref 35.0–45.0)
Hemoglobin: 13.9 g/dL (ref 11.7–15.5)
Lymphs Abs: 3350 cells/uL (ref 850–3900)
MCH: 26.8 pg — ABNORMAL LOW (ref 27.0–33.0)
MCHC: 31.3 g/dL — ABNORMAL LOW (ref 32.0–36.0)
MCV: 85.5 fL (ref 80.0–100.0)
MPV: 10.7 fL (ref 7.5–12.5)
Monocytes Relative: 7 %
Neutro Abs: 10618 cells/uL — ABNORMAL HIGH (ref 1500–7800)
Neutrophils Relative %: 67.2 %
Platelets: 327 10*3/uL (ref 140–400)
RBC: 5.19 10*6/uL — ABNORMAL HIGH (ref 3.80–5.10)
RDW: 13.7 % (ref 11.0–15.0)
Total Lymphocyte: 21.2 %
WBC: 15.8 10*3/uL — ABNORMAL HIGH (ref 3.8–10.8)

## 2021-01-20 LAB — COMPLETE METABOLIC PANEL WITH GFR
AG Ratio: 1.4 (calc) (ref 1.0–2.5)
ALT: 12 U/L (ref 6–29)
AST: 22 U/L (ref 10–35)
Albumin: 4 g/dL (ref 3.6–5.1)
Alkaline phosphatase (APISO): 105 U/L (ref 37–153)
BUN/Creatinine Ratio: 21 (calc) (ref 6–22)
BUN: 19 mg/dL (ref 7–25)
CO2: 29 mmol/L (ref 20–32)
Calcium: 9.6 mg/dL (ref 8.6–10.4)
Chloride: 103 mmol/L (ref 98–110)
Creat: 0.89 mg/dL — ABNORMAL HIGH (ref 0.60–0.88)
GFR, Est African American: 71 mL/min/{1.73_m2} (ref >=60)
GFR, Est Non African American: 61 mL/min/{1.73_m2} (ref >=60)
Globulin: 2.9 g/dL (calc) (ref 1.9–3.7)
Glucose, Bld: 92 mg/dL (ref 65–99)
Potassium: 4.1 mmol/L (ref 3.5–5.3)
Sodium: 141 mmol/L (ref 135–146)
Total Bilirubin: 0.3 mg/dL (ref 0.2–1.2)
Total Protein: 6.9 g/dL (ref 6.1–8.1)

## 2021-01-20 MED ORDER — OXYCODONE-ACETAMINOPHEN 10-325 MG PO TABS: 1.0000 | ORAL_TABLET | Freq: Four times a day (QID) | ORAL | 0 refills | Status: DC | PRN

## 2021-01-20 NOTE — Progress Notes (Signed)
Please refer the patient to hematology for evaluation of chronic leukocytosis.  It may be due to chronic inflammation secondary to RA but I would still recommend a referral for further evaluation.

## 2021-01-20 NOTE — Progress Notes (Signed)
Creatinine is borderline elevated.  GFR WNL.  Rest of CMP WNL.    WBC count remains chronically elevated.  Absolute neutrophils, monocytes, and eosinophils are elevated.  Please clarify if the patient has seen a hematologist in the past.  Please forward lab work to PCP as well.

## 2021-01-20 NOTE — Addendum Note (Signed)
Addended by: Izora Ribas on: 01/20/2021 08:16 AM   Modules accepted: Orders

## 2021-01-20 NOTE — Telephone Encounter (Signed)
-----   Message from Ofilia Neas, PA-C sent at 01/20/2021  4:42 PM EDT ----- Please refer the patient to hematology for evaluation of chronic leukocytosis.  It may be due to chronic inflammation secondary to RA but I would still recommend a referral for further evaluation.

## 2021-01-21 ENCOUNTER — Telehealth: Payer: Self-pay | Admitting: Hematology and Oncology

## 2021-01-21 NOTE — Telephone Encounter (Signed)
Received a new hem referral from Dr. Estanislado Pandy for leukocytosis. Ms. Govan has been cld and scheduled to see Dr. Lorenso Courier on 6/6 at [facility]. Pt aware to arrive 20 minutes early.

## 2021-01-24 NOTE — Progress Notes (Signed)
[place] [phone]   [phone]  INITIAL CONSULT NOTE  Patient Care Team: Ngetich, Nelda Bucks, NP as PCP - General (Family Medicine) Bo Merino, MD (Rheumatology) Altheimer, Legrand Como, MD as Attending Physician (Endocrinology) Newt Minion, MD as Consulting Physician (Orthopedic Surgery)  Hematological/Oncological History # Leukocytosis, Neutrophilic Predominance 11/5407: patient has lonstanding leukocytosis of neutrophilic predominance dating back to this time 01/25/2021: establish care with Dr. Lorenso Courier   CHIEF COMPLAINTS/PURPOSE OF CONSULTATION:  "Leukocytosis "  HISTORY OF PRESENTING ILLNESS:  Katherine Delgado 80 y.o. female with medical history significant for DM Type II, morbid obesity, GERD, fibromyalgia, COPD, and RA who presents for evaluation of leukocytosis.   On review of the previous records Katherine Delgado has a extremely long history of leukocytosis dating back to at least 10/30/2009.  During that period of time she has had some thrombocytosis occasionally, but for the most part has had normal hemoglobin and platelet counts.  This leukocytosis is always been neutrophilic in predominance.  Most recently on 01/20/2019 the patient was found to have white blood cell count of 15.8, hemoglobin 13.9, and a platelet count of 327.  Due to concern for these findings the patient was referred to hematology for further evaluation management  On exam today Katherine Delgado is accompanied by her son.  She reports that she has had a elevated white blood cell count for as long as she can remember.  She notes that her rheumatoid arthritis is currently well controlled with Morrie Sheldon.  She currently denies having issues with fevers, chills, sweats, nausea, vomiting or diarrhea.  She does have some swelling in the lower extremities but no other concerning findings.  She last had a mammogram in 2021 and her colonoscopy was performed in 2016.  On further discussion she  reports that she was a smoker but quit approximately 30 to 40 years ago.  She does not currently drink any alcohol.  Other than her lower extremity swelling she has no additional questions concerns or complaints.  Her family history is negative for any blood disorders or blood cancers.  A full 10 point ROS is listed below.  MEDICAL HISTORY:  Past Medical History:  Diagnosis Date  . Abnormality of gait 04/19/2016  . Acute infective polyneuritis ([place]) 2002  . Allergic rhinitis due to pollen   . Chronic pain syndrome   . COPD (chronic obstructive pulmonary disease) (HCC)    chronic bronchitis  . Depressive disorder, not elsewhere classified   . Diabetes mellitus without complication ([place])   . Diaphragmatic hernia without mention of obstruction or gangrene   . Dyslipidemia   . Fibromyalgia   . GERD (gastroesophageal reflux disease)    with h/o esophagitis  . Guillain-Barre syndrome ([place])   . History of benign thymus tumor   . Insomnia, unspecified   . Lumbar spinal stenosis 03/30/2018   L4-5 level, severe  . Miscarriage 1962  . Mixed hyperlipidemia   . Morbid obesity ([place])   . Osteoporosis   . Pneumonia   . Polyneuropathy in diabetes(357.2)   . Rheumatoid arthritis(714.0)   . Spondylosis, lumbosacral   . Spontaneous ecchymoses   . Type II or unspecified type diabetes mellitus with peripheral circulatory disorders, uncontrolled(250.72)   . Unspecified chronic bronchitis ([place])   . Unspecified essential hypertension   . Unspecified hypothyroidism   . Unspecified pruritic disorder   . Unspecified urinary incontinence     SURGICAL HISTORY: Past Surgical History:  Procedure Laterality Date  . ABDOMINAL HYSTERECTOMY  1974  . abdominal tumor  2002  . APPENDECTOMY    . APPLICATION OF A-CELL OF BACK N/A 09/26/2018   Procedure: With Acell;  Surgeon: Wallace Going, DO;  Location: [facility];  Service: Plastics;  Laterality: N/A;  . APPLICATION OF WOUND VAC N/A 09/26/2018   Procedure: Vac  placement;  Surgeon: Wallace Going, DO;  Location: [facility];  Service: Plastics;  Laterality: N/A;  . [place]  . BRONCHOSCOPY  2001  . CATARACT EXTRACTION, BILATERAL  09/2019, 10/2019  . CHOLECYSTECTOMY  1984  . INCISION AND DRAINAGE OF WOUND N/A 09/26/2018   Procedure: Debridement of spine wound;  Surgeon: Wallace Going, DO;  Location: [facility];  Service: Plastics;  Laterality: N/A;  . KNEE SURGERY Bilateral 08/27/2010 (L) and 01/11/2011 (R)  . LUMBAR LAMINECTOMY/DECOMPRESSION MICRODISCECTOMY N/A 07/03/2018   Procedure: Lumbar Three to Lumbar Five Laminectomy;  Surgeon: Judith Part, MD;  Location: [facility];  Service: Neurosurgery;  Laterality: N/A;  . LUMBAR WOUND DEBRIDEMENT N/A 08/20/2018   Procedure: POSTERIOR LUMBAR SPINAL WOUND DEBRIDEMENT AND REVISION;  Surgeon: Judith Part, MD;  Location: [facility];  Service: Neurosurgery;  Laterality: N/A;  POSTERIOR LUMBAR SPINAL WOUND REVISION  . [place]  . OVARIAN CYST SURGERY  1968  . thymus tumor    . 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 TONSILLECTOMY      SOCIAL HISTORY: Social History   Socioeconomic History  . Marital status: Widowed    Spouse name: Not on file  . Number of children: 2  . Years of education: 33  . Highest education level: Not on file  Occupational History  . Occupation: Retired  Tobacco Use  . Smoking status: Former Smoker    Packs/day: 0.10    Years: 10.00    Pack years: 1.00    Types: Cigarettes    Quit date: 12/07/1979    Years since quitting: 41.1  . Smokeless tobacco: Never Used  Vaping Use  . Vaping Use: Never used  Substance and Sexual Activity  . Alcohol use: No    Alcohol/week: 0.0 standard drinks  . Drug use: No  . Sexual activity: Not Currently  Other Topics Concern  . Not on file  Social History Narrative   Walks with cane   Right handed    Caffeine use: Coffee (2 cups every morning)   Tea: sometimes   Soda: none   Social Determinants of Systems developer Strain: Not on file  Food Insecurity: Not on file  Transportation Needs: Not on file  Physical Activity: Not on file  Stress: Not on file  Social Connections: Not on file  Intimate Partner Violence: Not on file    FAMILY HISTORY: Family History  Problem Relation Age of Onset  . Alzheimer's disease Mother   . Heart disease Mother   . Heart disease Father   . Liver disease Father   . Cancer Brother   . Arthritis Son   . Colon polyps Brother   . Colon cancer Neg Hx   . Esophageal cancer Neg Hx   . Kidney disease Neg Hx   . Stomach cancer Neg Hx   . Rectal cancer Neg Hx     ALLERGIES:  is allergic to influenza vaccines, penicillins, statins, and sulfamethoxazole-trimethoprim.  MEDICATIONS:  Current Outpatient Medications  Medication Sig Dispense Refill  . acetaminophen (TYLENOL) 325 MG tablet Take 650 mg by mouth every 6 (six) hours as needed (for pain.).    Marland Kitchen  amitriptyline (ELAVIL) 25 MG tablet TAKE 1 TABLET(25 MG) BY MOUTH AT BEDTIME 30 tablet 3  . amLODipine (NORVASC) 10 MG tablet TAKE 1 TABLET(10 MG) BY MOUTH DAILY 90 tablet 1  . aspirin EC 81 MG tablet Take 81 mg by mouth 2 (two) times a week.    . Chlorphen-Phenyleph-ASA (ALKA-SELTZER PLUS COLD PO) Take by mouth as needed.    . cholecalciferol (VITAMIN D3) 25 MCG (1000 UT) tablet Take 1,000 Units by mouth daily.    Marland Kitchen docusate sodium (COLACE) 100 MG capsule Take 100 mg by mouth daily as needed (constipation.).     Marland Kitchen ezetimibe (ZETIA) 10 MG tablet Take 1 tablet (10 mg total) by mouth daily. 90 tablet 3  . folic acid (FOLVITE) 1 MG tablet TAKE 2 TABLETS BY MOUTH  DAILY 180 tablet 3  . furosemide (LASIX) 20 MG tablet TAKE 1 TABLET BY MOUTH TWICE DAILY AS NEEDED FOR 3 POUND WEIGHT GAIN IN ONE DAY OR 5 POUNDS IN ONE WEEK 60 tablet 0  . gabapentin (NEURONTIN) 600 MG tablet TAKE 1 TABLET(600 MG) BY MOUTH THREE TIMES DAILY 90 tablet 1  . insulin lispro (HUMALOG KWIKPEN) 200 UNIT/ML KwikPen Max daily 80 units 45 mL 3  .  Insulin Pen Needle (B-D UF III MINI PEN NEEDLES) 31G X 5 MM MISC USE AS DIRECTED 100 each 0  . ipratropium-albuterol (DUONEB) 0.5-2.5 (3) MG/3ML SOLN Take 3 mLs by nebulization every 6 (six) hours as needed. 360 mL 1  . levothyroxine (SYNTHROID) 112 MCG tablet Take 1 tablet (112 mcg total) by mouth daily. 90 tablet 3  . lidocaine (LIDODERM) 5 % Place 1 patch onto the skin daily. Remove & Discard patch within 12 hours or as directed by MD 30 patch 0  . lisinopril (ZESTRIL) 20 MG tablet Take one tablet by mouth once daily. 90 tablet 1  . loperamide (IMODIUM A-D) 2 MG tablet Take 2 mg by mouth 4 (four) times daily as needed for diarrhea or loose stools.    . Melatonin 10 MG TABS Take 10 mg by mouth at bedtime.    . metoprolol tartrate (LOPRESSOR) 50 MG tablet Take one tablet by mouth twice daily. 180 tablet 1  . Multiple Vitamins-Minerals (MULTIVITAMIN ADULT PO) Take 1 tablet by mouth daily.    Marland Kitchen nystatin (NYSTATIN) powder APPLY TOPICALLY THREE TIMES DAILY AS DIRECTED FOR 14 DAYS and prn yeast infection 60 g 3  . nystatin cream (MYCOSTATIN) Apply 1 application topically 2 (two) times daily. To skin folds 30 g 3  . omega-3 acid ethyl esters (LOVAZA) 1 g capsule TAKE ONE CAPSULE BY MOUTH EVERY DAY 30 capsule 0  . oxyCODONE-acetaminophen (PERCOCET) 10-325 MG tablet Take 1 tablet by mouth every 6 (six) hours as needed for pain. 60 tablet 0  . polyvinyl alcohol (LIQUIFILM TEARS) 1.4 % ophthalmic solution Place 1 drop into both eyes 4 (four) times daily as needed (for dry/irritated eyes).     . potassium chloride (MICRO-K) 10 MEQ CR capsule Take 2 capsules (20 mEq total) by mouth 2 (two) times daily as needed (when taking a dose of lasix.). 60 capsule 0  . sodium chloride (OCEAN) 0.65 % nasal spray Place 2 sprays into the nose as needed for congestion. 30 mL 1  . tiotropium (SPIRIVA) 18 MCG inhalation capsule Place 1 capsule (18 mcg total) into inhaler and inhale daily. 90 capsule 3  . tiZANidine (ZANAFLEX)  2 MG tablet TAKE 1 TABLET(2 MG) BY MOUTH THREE TIMES DAILY 90 tablet 1  .  TOUJEO SOLOSTAR 300 UNIT/ML Solostar Pen INJECT 100 UNITS INTO THE SKIN EVERY NIGHT AT BEDTIME 81 mL 0  . triamterene-hydrochlorothiazide (MAXZIDE-25) 37.5-25 MG tablet TAKE 1 TABLET BY MOUTH  DAILY 90 tablet 1  . venlafaxine XR (EFFEXOR-XR) 75 MG 24 hr capsule TAKE 1 CAPSULE BY MOUTH EVERY DAY WITH BREAKFAST 90 capsule 1  . Vibegron (GEMTESA) 75 MG TABS Take 1 tablet by mouth daily. 30 tablet 0  . VICTOZA 18 MG/3ML SOPN ADMINISTER 1.8 MG UNDER THE SKIN DAILY FOR BLOOD SUGAR 9 mL 5  . XELJANZ 5 MG TABS TAKE 1 TABLET BY MOUTH  DAILY 90 tablet 0   No current facility-administered medications for this visit.    REVIEW OF SYSTEMS:   Constitutional: ( - ) fevers, ( - )  chills , ( - ) night sweats Eyes: ( - ) blurriness of vision, ( - ) double vision, ( - ) watery eyes Ears, nose, mouth, throat, and face: ( - ) mucositis, ( - ) sore throat Respiratory: ( - ) cough, ( - ) dyspnea, ( - ) wheezes Cardiovascular: ( - ) palpitation, ( - ) chest discomfort, ( - ) lower extremity swelling Gastrointestinal:  ( - ) nausea, ( - ) heartburn, ( - ) change in bowel habits Skin: ( - ) abnormal skin rashes Lymphatics: ( - ) new lymphadenopathy, ( - ) easy bruising Neurological: ( - ) numbness, ( - ) tingling, ( - ) new weaknesses Behavioral/Psych: ( - ) mood change, ( - ) new changes  All other systems were reviewed with the patient and are negative.  PHYSICAL EXAMINATION: Vitals:   01/25/21 0900  BP: (!) 156/64  Pulse: 82  Resp: 20  Temp: 99.2 F (37.3 C)  SpO2: 95%   Filed Weights   01/25/21 0900  Weight: 234 lb 9.6 oz (106.4 kg)    GENERAL: well appearing elderly Caucasian female in NAD  SKIN: skin color, texture, turgor are normal, no rashes or significant lesions EYES: conjunctiva are pink and non-injected, sclera clear LUNGS: clear to auscultation and percussion with normal breathing effort HEART: regular rate &  rhythm and no murmurs and +2 bilateral extremity edema Musculoskeletal: no cyanosis of digits and no clubbing  PSYCH: alert & oriented x 3, fluent speech NEURO: no focal motor/sensory deficits  LABORATORY DATA:  I have reviewed the data as listed CBC Latest Ref Rng & Units 01/25/2021 01/19/2021 09/10/2020  WBC 4.0 - 10.5 K/uL 16.1(H) 15.8(H) 15.0(H)  Hemoglobin 12.0 - 15.0 g/dL 13.8 13.9 13.7  Hematocrit 36.0 - 46.0 % 42.3 44.4 41.8  Platelets 150 - 400 K/uL 293 327 334    CMP Latest Ref Rng & Units 01/25/2021 01/19/2021 10/22/2020  Glucose 70 - 99 mg/dL 112(H) 92 -  BUN 8 - 23 mg/dL 16 19 -  Creatinine 0.44 - 1.00 mg/dL 0.92 0.89(H) -  Sodium 135 - 145 mmol/L 141 141 -  Potassium 3.5 - 5.1 mmol/L 3.9 4.1 -  Chloride 98 - 111 mmol/L 102 103 -  CO2 22 - 32 mmol/L 28 29 -  Calcium 8.9 - 10.3 mg/dL 9.7 9.6 -  Total Protein 6.5 - 8.1 g/dL 7.4 6.9 7.0  Total Bilirubin 0.3 - 1.2 mg/dL 0.4 0.3 0.4  Alkaline Phos 38 - 126 U/L 96 - -  AST 15 - 41 U/L 26 22 35  ALT 0 - 44 U/L _0 RADIOGRAPHIC STUDIES: No results found.  ASSESSMENT & PLAN Katherine Delgado 80  y.o. female with medical history significant for DM Type II, morbid obesity, GERD, fibromyalgia, COPD, and RA who presents for evaluation of leukocytosis.   After review of the labs, review of the records, and discussion with the patient the patients findings are most consistent with chronic leukocytosis from inflammation.  The patient is always had a neutrophilic predominance and normal hemoglobin and platelet count.  Given these findings I do suspect a likely benign cause such as her background inflammation.  In order to help rule out other possible causes we will order a myeloproliferative neoplasms as well as inflammatory proteins.  In the event that there are no concerning findings on the MPN panel we do not need to routinely follow with this patient in our clinic.  Last steroid taper: 08/27/2020  # Leukocytosis, Neutrophilic  Predominance -- MPN evaluation with JAK2 w/ reflex and BCR/ABL --inflammatory markers with ESR/CRP --will order a peripheral blood film -- In the event that the MPN panel is negative and the inflammatory markers are elevated would recommend continued routine follow-up with rheumatology  Orders Placed This Encounter  Procedures  . JAK2 (INCLUDING V617F AND EXON 12), MPL,& CALR W/RFL MPN PANEL ([facility])    Standing Status:   Future    Number of Occurrences:   1    Standing Expiration Date:   01/25/2022  . BCR ABL1 FISH ([facility])    Standing Status:   Future    Number of Occurrences:   1    Standing Expiration Date:   01/25/2022  . CBC with Differential ([facility] Center Only)    Standing Status:   Future    Number of Occurrences:   1    Standing Expiration Date:   01/25/2022  . CMP ([facility] only)    Standing Status:   Future    Number of Occurrences:   1    Standing Expiration Date:   01/25/2022  . Save Smear ([facility])    Standing Status:   Future    Number of Occurrences:   1    Standing Expiration Date:   01/25/2022  . Sedimentation rate    Standing Status:   Future    Number of Occurrences:   1    Standing Expiration Date:   01/25/2022  . C-reactive protein    Standing Status:   Future    Number of Occurrences:   1    Standing Expiration Date:   01/25/2022    All questions were answered. The patient knows to call the clinic with any problems, questions or concerns.  A total of more than 60 minutes were spent on this encounter with face-to-face time and non-face-to-face time, including preparing to see the patient, ordering tests and/or medications, counseling the patient and coordination of care as outlined above.   Ledell Peoples, MD Department of Hematology/Oncology [place] [facility] Phone: [phone] Pager: [phone] Email: [email or web address]  01/25/2021 11:41 AM

## 2021-01-25 ENCOUNTER — Inpatient Hospital Stay: Payer: Medicare Other | Attending: Hematology and Oncology | Admitting: Hematology and Oncology

## 2021-01-25 ENCOUNTER — Inpatient Hospital Stay: Payer: Medicare Other

## 2021-01-25 ENCOUNTER — Other Ambulatory Visit: Payer: Self-pay

## 2021-01-25 ENCOUNTER — Encounter: Payer: Self-pay | Admitting: Hematology and Oncology

## 2021-01-25 VITALS — BP 156/64 | HR 82 | Temp 99.2°F | Resp 20 | Ht 66.0 in | Wt 234.6 lb

## 2021-01-25 DIAGNOSIS — Z809 Family history of malignant neoplasm, unspecified: Secondary | ICD-10-CM | POA: Diagnosis not present

## 2021-01-25 DIAGNOSIS — K219 Gastro-esophageal reflux disease without esophagitis: Secondary | ICD-10-CM | POA: Insufficient documentation

## 2021-01-25 DIAGNOSIS — J449 Chronic obstructive pulmonary disease, unspecified: Secondary | ICD-10-CM

## 2021-01-25 DIAGNOSIS — D72825 Bandemia: Secondary | ICD-10-CM | POA: Diagnosis not present

## 2021-01-25 DIAGNOSIS — Z794 Long term (current) use of insulin: Secondary | ICD-10-CM

## 2021-01-25 DIAGNOSIS — Z87891 Personal history of nicotine dependence: Secondary | ICD-10-CM | POA: Diagnosis not present

## 2021-01-25 DIAGNOSIS — E119 Type 2 diabetes mellitus without complications: Secondary | ICD-10-CM | POA: Diagnosis not present

## 2021-01-25 DIAGNOSIS — M069 Rheumatoid arthritis, unspecified: Secondary | ICD-10-CM | POA: Insufficient documentation

## 2021-01-25 DIAGNOSIS — D72829 Elevated white blood cell count, unspecified: Secondary | ICD-10-CM

## 2021-01-25 LAB — CBC WITH DIFFERENTIAL (CANCER CENTER ONLY)
Abs Immature Granulocytes: 0.09 10*3/uL — ABNORMAL HIGH (ref 0.00–0.07)
Basophils Absolute: 0.1 10*3/uL (ref 0.0–0.1)
Basophils Relative: 1 %
Eosinophils Absolute: 0.4 10*3/uL (ref 0.0–0.5)
Eosinophils Relative: 3 %
HCT: 42.3 % (ref 36.0–46.0)
Hemoglobin: 13.8 g/dL (ref 12.0–15.0)
Immature Granulocytes: 1 %
Lymphocytes Relative: 10 %
Lymphs Abs: 1.7 10*3/uL (ref 0.7–4.0)
MCH: 27.7 pg (ref 26.0–34.0)
MCHC: 32.6 g/dL (ref 30.0–36.0)
MCV: 84.8 fL (ref 80.0–100.0)
Monocytes Absolute: 1 10*3/uL (ref 0.1–1.0)
Monocytes Relative: 7 %
Neutro Abs: 12.8 10*3/uL — ABNORMAL HIGH (ref 1.7–7.7)
Neutrophils Relative %: 78 %
Platelet Count: 293 10*3/uL (ref 150–400)
RBC: 4.99 MIL/uL (ref 3.87–5.11)
RDW: 14.2 % (ref 11.5–15.5)
WBC Count: 16.1 10*3/uL — ABNORMAL HIGH (ref 4.0–10.5)
nRBC: 0 % (ref 0.0–0.2)

## 2021-01-25 LAB — CMP (CANCER CENTER ONLY)
ALT: 18 U/L (ref 0–44)
AST: 26 U/L (ref 15–41)
Albumin: 3.5 g/dL (ref 3.5–5.0)
Alkaline Phosphatase: 96 U/L (ref 38–126)
Anion gap: 11 (ref 5–15)
BUN: 16 mg/dL (ref 8–23)
CO2: 28 mmol/L (ref 22–32)
Calcium: 9.7 mg/dL (ref 8.9–10.3)
Chloride: 102 mmol/L (ref 98–111)
Creatinine: 0.92 mg/dL (ref 0.44–1.00)
GFR, Estimated: >60 mL/min (ref >60)
Glucose, Bld: 112 mg/dL — ABNORMAL HIGH (ref 70–99)
Potassium: 3.9 mmol/L (ref 3.5–5.1)
Sodium: 141 mmol/L (ref 135–145)
Total Bilirubin: 0.4 mg/dL (ref 0.3–1.2)
Total Protein: 7.4 g/dL (ref 6.5–8.1)

## 2021-01-25 LAB — SAVE SMEAR(SSMR), FOR PROVIDER SLIDE REVIEW

## 2021-01-25 LAB — C-REACTIVE PROTEIN: CRP: 1.4 mg/dL — ABNORMAL HIGH (ref <1.0)

## 2021-01-25 LAB — SEDIMENTATION RATE: Sed Rate: 29 mm/hr — ABNORMAL HIGH (ref 0–22)

## 2021-01-27 ENCOUNTER — Other Ambulatory Visit: Payer: Self-pay | Admitting: Physician Assistant

## 2021-01-27 DIAGNOSIS — R531 Weakness: Secondary | ICD-10-CM | POA: Diagnosis not present

## 2021-01-27 DIAGNOSIS — M4801 Spinal stenosis, occipito-atlanto-axial region: Secondary | ICD-10-CM | POA: Diagnosis not present

## 2021-02-02 ENCOUNTER — Telehealth: Payer: Self-pay

## 2021-02-02 LAB — BCR ABL1 FISH (GENPATH)

## 2021-02-02 LAB — JAK2 (INCLUDING V617F AND EXON 12), MPL,& CALR W/RFL MPN PANEL (NGS)

## 2021-02-02 NOTE — Telephone Encounter (Signed)
-----   Message from Orson Slick, MD sent at 02/02/2021 12:56 PM EDT ----- Myriam Jacobson,  Please let Mrs. Hooser know that her elevated WBC is most likely due to inflammation from her rheumatoid arthritis. We did not find any other concerning abnormalities in our bloodwork. There is no need for routine f/u in our clinic.  ----- Message ----- From: [place]: 01/25/2021  10:43 AM EDT To: Orson Slick, MD

## 2021-02-02 NOTE — Telephone Encounter (Signed)
I contacted Jonnie Finner per DPR to relay this patient's lab results. He verbalized understanding.

## 2021-02-04 DIAGNOSIS — G894 Chronic pain syndrome: Secondary | ICD-10-CM | POA: Diagnosis not present

## 2021-02-04 DIAGNOSIS — Z87440 Personal history of urinary (tract) infections: Secondary | ICD-10-CM | POA: Diagnosis not present

## 2021-02-04 DIAGNOSIS — E860 Dehydration: Secondary | ICD-10-CM | POA: Diagnosis not present

## 2021-02-04 DIAGNOSIS — G9341 Metabolic encephalopathy: Secondary | ICD-10-CM | POA: Diagnosis not present

## 2021-02-04 DIAGNOSIS — D72829 Elevated white blood cell count, unspecified: Secondary | ICD-10-CM | POA: Diagnosis not present

## 2021-02-04 DIAGNOSIS — E871 Hypo-osmolality and hyponatremia: Secondary | ICD-10-CM | POA: Diagnosis not present

## 2021-02-04 DIAGNOSIS — G9340 Encephalopathy, unspecified: Secondary | ICD-10-CM | POA: Diagnosis not present

## 2021-02-04 DIAGNOSIS — N390 Urinary tract infection, site not specified: Secondary | ICD-10-CM | POA: Diagnosis not present

## 2021-02-04 DIAGNOSIS — E1165 Type 2 diabetes mellitus with hyperglycemia: Secondary | ICD-10-CM | POA: Diagnosis not present

## 2021-02-04 DIAGNOSIS — M069 Rheumatoid arthritis, unspecified: Secondary | ICD-10-CM | POA: Diagnosis not present

## 2021-02-04 DIAGNOSIS — J329 Chronic sinusitis, unspecified: Secondary | ICD-10-CM | POA: Diagnosis not present

## 2021-02-04 DIAGNOSIS — R6520 Severe sepsis without septic shock: Secondary | ICD-10-CM | POA: Diagnosis not present

## 2021-02-04 DIAGNOSIS — R509 Fever, unspecified: Secondary | ICD-10-CM | POA: Diagnosis not present

## 2021-02-04 DIAGNOSIS — Z792 Long term (current) use of antibiotics: Secondary | ICD-10-CM | POA: Diagnosis not present

## 2021-02-04 DIAGNOSIS — R059 Cough, unspecified: Secondary | ICD-10-CM | POA: Diagnosis not present

## 2021-02-04 DIAGNOSIS — A419 Sepsis, unspecified organism: Secondary | ICD-10-CM | POA: Diagnosis not present

## 2021-02-04 DIAGNOSIS — Z79899 Other long term (current) drug therapy: Secondary | ICD-10-CM | POA: Diagnosis not present

## 2021-02-04 DIAGNOSIS — Z79891 Long term (current) use of opiate analgesic: Secondary | ICD-10-CM | POA: Diagnosis not present

## 2021-02-04 DIAGNOSIS — R531 Weakness: Secondary | ICD-10-CM | POA: Diagnosis not present

## 2021-02-04 DIAGNOSIS — R109 Unspecified abdominal pain: Secondary | ICD-10-CM | POA: Diagnosis not present

## 2021-02-04 DIAGNOSIS — I10 Essential (primary) hypertension: Secondary | ICD-10-CM | POA: Diagnosis not present

## 2021-02-04 DIAGNOSIS — E876 Hypokalemia: Secondary | ICD-10-CM | POA: Diagnosis not present

## 2021-02-04 DIAGNOSIS — Z87891 Personal history of nicotine dependence: Secondary | ICD-10-CM | POA: Diagnosis not present

## 2021-02-04 DIAGNOSIS — Z20822 Contact with and (suspected) exposure to covid-19: Secondary | ICD-10-CM | POA: Diagnosis not present

## 2021-02-04 DIAGNOSIS — Z794 Long term (current) use of insulin: Secondary | ICD-10-CM | POA: Diagnosis not present

## 2021-02-08 ENCOUNTER — Other Ambulatory Visit: Payer: Self-pay | Admitting: *Deleted

## 2021-02-08 ENCOUNTER — Other Ambulatory Visit: Payer: Self-pay

## 2021-02-08 ENCOUNTER — Other Ambulatory Visit: Payer: Self-pay | Admitting: Family

## 2021-02-08 DIAGNOSIS — E1142 Type 2 diabetes mellitus with diabetic polyneuropathy: Secondary | ICD-10-CM

## 2021-02-08 DIAGNOSIS — E1169 Type 2 diabetes mellitus with other specified complication: Secondary | ICD-10-CM

## 2021-02-08 DIAGNOSIS — Z794 Long term (current) use of insulin: Secondary | ICD-10-CM

## 2021-02-08 DIAGNOSIS — E785 Hyperlipidemia, unspecified: Secondary | ICD-10-CM

## 2021-02-08 DIAGNOSIS — E039 Hypothyroidism, unspecified: Secondary | ICD-10-CM

## 2021-02-08 MED ORDER — METOPROLOL TARTRATE 50 MG PO TABS
ORAL_TABLET | ORAL | 0 refills | Status: DC
Start: 2021-02-08 — End: 2021-04-27

## 2021-02-08 NOTE — Telephone Encounter (Signed)
Pharmacy requested refill

## 2021-02-09 ENCOUNTER — Other Ambulatory Visit: Payer: Medicare Other

## 2021-02-10 ENCOUNTER — Ambulatory Visit (INDEPENDENT_AMBULATORY_CARE_PROVIDER_SITE_OTHER): Payer: Medicare Other | Admitting: Family

## 2021-02-10 ENCOUNTER — Other Ambulatory Visit: Payer: Self-pay | Admitting: *Deleted

## 2021-02-10 ENCOUNTER — Other Ambulatory Visit: Payer: Self-pay

## 2021-02-10 ENCOUNTER — Telehealth: Payer: Self-pay

## 2021-02-10 ENCOUNTER — Encounter: Payer: Self-pay | Admitting: Family

## 2021-02-10 DIAGNOSIS — E1151 Type 2 diabetes mellitus with diabetic peripheral angiopathy without gangrene: Secondary | ICD-10-CM

## 2021-02-10 DIAGNOSIS — Z0000 Encounter for general adult medical examination without abnormal findings: Secondary | ICD-10-CM | POA: Diagnosis not present

## 2021-02-10 MED ORDER — LISINOPRIL 20 MG PO TABS: ORAL_TABLET | ORAL | 1 refills | Status: DC

## 2021-02-10 NOTE — Patient Instructions (Signed)
Ms.Demma, Thank you for taking time to come for your Medicare Wellness Visit. I appreciate your ongoing commitment to your health goals. Please review the following plan we discussed and let me know if I can assist you in the future.   Screening recommendations/referrals: Colonoscopy: N/a  Mammogram : Up to date  Bone Density : Up to date  Recommended yearly ophthalmology/optometry visit for glaucoma screening and checkup Recommended yearly dental visit for hygiene and checkup  Vaccinations: Influenza vaccine N/A  Pneumococcal vaccine : Up to date  Tdap vaccine : Up to date  Shingles vaccine Due please get shingrix vaccine at your pharmacy   Advanced directives: yes   Conditions/risks identified: Advance age female > 22 yrs,Type 2 Diabetes,Hx of smoking,Hypertension and Obesity BMI > 30   Next appointment: 1 year    Preventive Care 74 Years and Older, Female Preventive care refers to lifestyle choices and visits with your health care provider that can promote health and wellness. What does preventive care include? A yearly physical exam. This is also called an annual well check. Dental exams once or twice a year. Routine eye exams. Ask your health care provider how often you should have your eyes checked. Personal lifestyle choices, including: Daily care of your teeth and gums. Regular physical activity. Eating a healthy diet. Avoiding tobacco and drug use. Limiting alcohol use. Practicing safe sex. Taking low-dose aspirin every day. Taking vitamin and mineral supplements as recommended by your health care provider. What happens during an annual well check? The services and screenings done by your health care provider during your annual well check will depend on your age, overall health, lifestyle risk factors, and family history of disease. Counseling  Your health care provider may ask you questions about your: Alcohol use. Tobacco use. Drug use. Emotional  well-being. Home and relationship well-being. Sexual activity. Eating habits. History of falls. Memory and ability to understand (cognition). Work and work Statistician. Reproductive health. Screening  You may have the following tests or measurements: Height, weight, and BMI. Blood pressure. Lipid and cholesterol levels. These may be checked every 5 years, or more frequently if you are over 25 years old. Skin check. Lung cancer screening. You may have this screening every year starting at age 32 if you have a 30-pack-year history of smoking and currently smoke or have quit within the past 15 years. Fecal occult blood test (FOBT) of the stool. You may have this test every year starting at age 48. Flexible sigmoidoscopy or colonoscopy. You may have a sigmoidoscopy every 5 years or a colonoscopy every 10 years starting at age 94. Hepatitis C blood test. Hepatitis B blood test. Sexually transmitted disease (STD) testing. Diabetes screening. This is done by checking your blood sugar (glucose) after you have not eaten for a while (fasting). You may have this done every 1-3 years. Bone density scan. This is done to screen for osteoporosis. You may have this done starting at age 54. Mammogram. This may be done every 1-2 years. Talk to your health care provider about how often you should have regular mammograms. Talk with your health care provider about your test results, treatment options, and if necessary, the need for more tests. Vaccines  Your health care provider may recommend certain vaccines, such as: Influenza vaccine. This is recommended every year. Tetanus, diphtheria, and acellular pertussis (Tdap, Td) vaccine. You may need a Td booster every 10 years. Zoster vaccine. You may need this after age 48. Pneumococcal 13-valent conjugate (PCV13) vaccine. One  dose is recommended after age 42. Pneumococcal polysaccharide (PPSV23) vaccine. One dose is recommended after age 74. Talk to your  health care provider about which screenings and vaccines you need and how often you need them. This information is not intended to replace advice given to you by your health care provider. Make sure you discuss any questions you have with your health care provider. Document Released: 09/04/2015 Document Revised: 04/27/2016 Document Reviewed: 06/09/2015 Elsevier Interactive Patient Education  2017 [place] Prevention in the Home Falls can cause injuries. They can happen to people of all ages. There are many things you can do to make your home safe and to help prevent falls. What can I do on the outside of my home? Regularly fix the edges of walkways and driveways and fix any cracks. Remove anything that might make you trip as you walk through a door, such as a raised step or threshold. Trim any bushes or trees on the path to your home. Use bright outdoor lighting. Clear any walking paths of anything that might make someone trip, such as rocks or tools. Regularly check to see if handrails are loose or broken. Make sure that both sides of any steps have handrails. Any raised decks and porches should have guardrails on the edges. Have any leaves, snow, or ice cleared regularly. Use sand or salt on walking paths during winter. Clean up any spills in your garage right away. This includes oil or grease spills. What can I do in the bathroom? Use night lights. Install grab bars by the toilet and in the tub and shower. Do not use towel bars as grab bars. Use non-skid mats or decals in the tub or shower. If you need to sit down in the shower, use a plastic, non-slip stool. Keep the floor dry. Clean up any water that spills on the floor as soon as it happens. Remove soap buildup in the tub or shower regularly. Attach bath mats securely with double-sided non-slip rug tape. Do not have throw rugs and other things on the floor that can make you trip. What can I do in the bedroom? Use night  lights. Make sure that you have a light by your bed that is easy to reach. Do not use any sheets or blankets that are too big for your bed. They should not hang down onto the floor. Have a firm chair that has side arms. You can use this for support while you get dressed. Do not have throw rugs and other things on the floor that can make you trip. What can I do in the kitchen? Clean up any spills right away. Avoid walking on wet floors. Keep items that you use a lot in easy-to-reach places. If you need to reach something above you, use a strong step stool that has a grab bar. Keep electrical cords out of the way. Do not use floor polish or wax that makes floors slippery. If you must use wax, use non-skid floor wax. Do not have throw rugs and other things on the floor that can make you trip. What can I do with my stairs? Do not leave any items on the stairs. Make sure that there are handrails on both sides of the stairs and use them. Fix handrails that are broken or loose. Make sure that handrails are as long as the stairways. Check any carpeting to make sure that it is firmly attached to the stairs. Fix any carpet that is loose or worn.  Avoid having throw rugs at the top or bottom of the stairs. If you do have throw rugs, attach them to the floor with carpet tape. Make sure that you have a light switch at the top of the stairs and the bottom of the stairs. If you do not have them, ask someone to add them for you. What else can I do to help prevent falls? Wear shoes that: Do not have high heels. Have rubber bottoms. Are comfortable and fit you well. Are closed at the toe. Do not wear sandals. If you use a stepladder: Make sure that it is fully opened. Do not climb a closed stepladder. Make sure that both sides of the stepladder are locked into place. Ask someone to hold it for you, if possible. Clearly mark and make sure that you can see: Any grab bars or handrails. First and last  steps. Where the edge of each step is. Use tools that help you move around (mobility aids) if they are needed. These include: Canes. Walkers. Scooters. Crutches. Turn on the lights when you go into a dark area. Replace any light bulbs as soon as they burn out. Set up your furniture so you have a clear path. Avoid moving your furniture around. If any of your floors are uneven, fix them. If there are any pets around you, be aware of where they are. Review your medicines with your doctor. Some medicines can make you feel dizzy. This can increase your chance of falling. Ask your doctor what other things that you can do to help prevent falls. This information is not intended to replace advice given to you by your health care provider. Make sure you discuss any questions you have with your health care provider. Document Released: 06/04/2009 Document Revised: 01/14/2016 Document Reviewed: 09/12/2014 Elsevier Interactive Patient Education  2017 [employer].

## 2021-02-10 NOTE — Telephone Encounter (Signed)
Katherine Delgado, Katherine Delgado are scheduled for a virtual visit with your provider today.    Just as we do with appointments in the office, we must obtain your consent to participate.  Your consent will be active for this visit and any virtual visit you may have with one of our providers in the next 365 days.    If you have a MyChart account, I can also send a copy of this consent to you electronically.  All virtual visits are billed to your insurance company just like a traditional visit in the office.  As this is a virtual visit, video technology does not allow for your provider to perform a traditional examination.  This may limit your provider's ability to fully assess your condition.  If your provider identifies any concerns that need to be evaluated in person or the need to arrange testing such as labs, EKG, etc, we will make arrangements to do so.    Although advances in technology are sophisticated, we cannot ensure that it will always work on either your end or our end.  If the connection with a video visit is poor, we may have to switch to a telephone visit.  With either a video or telephone visit, we are not always able to ensure that we have a secure connection.   I need to obtain your verbal consent now.   Are you willing to proceed with your visit today?   Katherine Delgado has provided verbal consent on 02/10/2021 for a virtual visit (video or telephone).   [place], [place] 02/10/2021  11:00AM

## 2021-02-10 NOTE — Progress Notes (Signed)
This service is provided via telemedicine  No vital signs collected/recorded due to the encounter was a telemedicine visit.   Location of patient (ex: home, work): Home.  Patient consents to a telephone visit: Yes. Location of the provider (ex: office, home): [employer].  Name of any referring provider: Estoria Geary, Nelda Bucks, NP   Names of all persons participating in the telemedicine service and their role in the encounter: Patient, Son Loucille Takach, [name], RMA, Nellie Pester, [place], NP.    Time spent on call: 8 minutes spent on the phone with Medical Assistant.     Subjective:   Katherine Delgado is a 80 y.o. female who presents for Medicare Annual (Subsequent) preventive examination.  Review of Systems     Cardiac Risk Factors include: advanced age (>5men, >69 women);diabetes mellitus;smoking/ tobacco exposure;hypertension;obesity (BMI >30kg/m2)     Objective:    Today's Vitals   02/10/21 1229  PainSc: 7    There is no height or weight on file to calculate BMI.  Advanced Directives 02/10/2021 12/09/2020 11/07/2020 10/15/2020 09/07/2020 08/27/2020 08/24/2020  Does Patient Have a Medical Advance Directive? Yes Yes No Yes Yes Yes Yes  Type of Advance Directive Living will - - Out of facility DNR (pink MOST or yellow form) Out of facility DNR (pink MOST or yellow form) Out of facility DNR (pink MOST or yellow form) Out of facility DNR (pink MOST or yellow form)  Does patient want to make changes to medical advance directive? No - Patient declined No - Patient declined - No - Patient declined No - Patient declined No - Patient declined No - Patient declined  Copy of [place] in Chart? - - - - - - -  Would patient like information on creating a medical advance directive? - - - - - - -  Pre-existing out of facility DNR order (yellow form or pink MOST form) - Pink MOST form placed in chart (order not valid for inpatient use) - Pink MOST form placed in  chart (order not valid for inpatient use) - - -    Current Medications (verified) Outpatient Encounter Medications as of 02/10/2021  Medication Sig   acetaminophen (TYLENOL) 325 MG tablet Take 650 mg by mouth every 6 (six) hours as needed (for pain.).   amitriptyline (ELAVIL) 25 MG tablet TAKE 1 TABLET(25 MG) BY MOUTH AT BEDTIME   amLODipine (NORVASC) 10 MG tablet TAKE 1 TABLET(10 MG) BY MOUTH DAILY   aspirin EC 81 MG tablet Take 81 mg by mouth 2 (two) times a week.   Chlorphen-Phenyleph-ASA (ALKA-SELTZER PLUS COLD PO) Take by mouth as needed.   cholecalciferol (VITAMIN D3) 25 MCG (1000 UT) tablet Take 1,000 Units by mouth daily.   docusate sodium (COLACE) 100 MG capsule Take 100 mg by mouth daily as needed (constipation.).    ezetimibe (ZETIA) 10 MG tablet Take 1 tablet (10 mg total) by mouth daily.   folic acid (FOLVITE) 1 MG tablet TAKE 2 TABLETS BY MOUTH  DAILY   furosemide (LASIX) 20 MG tablet TAKE 1 TABLET BY MOUTH TWICE DAILY AS NEEDED FOR 3 POUND WEIGHT GAIN IN ONE DAY OR 5 POUNDS IN ONE WEEK   gabapentin (NEURONTIN) 600 MG tablet TAKE 1 TABLET(600 MG) BY MOUTH THREE TIMES DAILY   insulin lispro (HUMALOG KWIKPEN) 200 UNIT/ML KwikPen Max daily 80 units   Insulin Pen Needle (B-D UF III MINI PEN NEEDLES) 31G X 5 MM MISC USE AS DIRECTED   ipratropium-albuterol (DUONEB)  0.5-2.5 (3) MG/3ML SOLN Take 3 mLs by nebulization every 6 (six) hours as needed.   levothyroxine (SYNTHROID) 112 MCG tablet Take 1 tablet (112 mcg total) by mouth daily.   lidocaine (LIDODERM) 5 % Place 1 patch onto the skin daily. Remove & Discard patch within 12 hours or as directed by MD   loperamide (IMODIUM A-D) 2 MG tablet Take 2 mg by mouth 4 (four) times daily as needed for diarrhea or loose stools.   Melatonin 10 MG TABS Take 10 mg by mouth at bedtime.   metoprolol tartrate (LOPRESSOR) 50 MG tablet Take one tablet by mouth twice daily.   Multiple Vitamins-Minerals (MULTIVITAMIN ADULT PO) Take 1 tablet by mouth  daily.   nystatin (NYSTATIN) powder APPLY TOPICALLY THREE TIMES DAILY AS DIRECTED FOR 14 DAYS and prn yeast infection   nystatin cream (MYCOSTATIN) Apply 1 application topically 2 (two) times daily. To skin folds   omega-3 acid ethyl esters (LOVAZA) 1 g capsule TAKE ONE CAPSULE BY MOUTH EVERY DAY   oxyCODONE-acetaminophen (PERCOCET) 10-325 MG tablet Take 1 tablet by mouth every 6 (six) hours as needed for pain.   polyvinyl alcohol (LIQUIFILM TEARS) 1.4 % ophthalmic solution Place 1 drop into both eyes 4 (four) times daily as needed (for dry/irritated eyes).    potassium chloride (MICRO-K) 10 MEQ CR capsule Take 2 capsules (20 mEq total) by mouth 2 (two) times daily as needed (when taking a dose of lasix.).   sodium chloride (OCEAN) 0.65 % nasal spray Place 2 sprays into the nose as needed for congestion.   tiotropium (SPIRIVA) 18 MCG inhalation capsule Place 1 capsule (18 mcg total) into inhaler and inhale daily.   tiZANidine (ZANAFLEX) 2 MG tablet TAKE 1 TABLET(2 MG) BY MOUTH THREE TIMES DAILY   TOUJEO SOLOSTAR 300 UNIT/ML Solostar Pen INJECT 100 UNITS INTO THE SKIN EVERY NIGHT AT BEDTIME   triamterene-hydrochlorothiazide (MAXZIDE-25) 37.5-25 MG tablet TAKE 1 TABLET BY MOUTH  DAILY   venlafaxine XR (EFFEXOR-XR) 75 MG 24 hr capsule TAKE 1 CAPSULE BY MOUTH EVERY DAY WITH BREAKFAST   Vibegron (GEMTESA) 75 MG TABS Take 1 tablet by mouth daily.   VICTOZA 18 MG/3ML SOPN ADMINISTER 1.8 MG UNDER THE SKIN DAILY FOR BLOOD SUGAR   XELJANZ 5 MG TABS TAKE 1 TABLET BY MOUTH  DAILY   [DISCONTINUED] lisinopril (ZESTRIL) 20 MG tablet Take one tablet by mouth once daily.   No facility-administered encounter medications on file as of 02/10/2021.    Allergies (verified) Influenza vaccines, Penicillins, Statins, and Sulfamethoxazole-trimethoprim   History: Past Medical History:  Diagnosis Date   Abnormality of gait 04/19/2016   Acute infective polyneuritis ([place]) 2002   Allergic rhinitis due to pollen     Chronic pain syndrome    COPD (chronic obstructive pulmonary disease) (HCC)    chronic bronchitis   Depressive disorder, not elsewhere classified    Diabetes mellitus without complication ([place])    Diaphragmatic hernia without mention of obstruction or gangrene    Dyslipidemia    Fibromyalgia    GERD (gastroesophageal reflux disease)    with h/o esophagitis   Guillain-Barre syndrome (HCC)    History of benign thymus tumor    Insomnia, unspecified    Lumbar spinal stenosis 03/30/2018   L4-5 level, severe   Miscarriage 1962   Mixed hyperlipidemia    Morbid obesity ([place])    Osteoporosis    Pneumonia    Polyneuropathy in diabetes(357.2)    Rheumatoid arthritis(714.0)    Spondylosis, lumbosacral  Spontaneous ecchymoses    Type II or unspecified type diabetes mellitus with peripheral circulatory disorders, uncontrolled(250.72)    Unspecified chronic bronchitis (HCC)    Unspecified essential hypertension    Unspecified hypothyroidism    Unspecified pruritic disorder    Unspecified urinary incontinence    Past Surgical History:  Procedure Laterality Date   ABDOMINAL HYSTERECTOMY  1974   abdominal tumor  2002   APPENDECTOMY     APPLICATION OF A-CELL OF BACK N/A 09/26/2018   Procedure: With Acell;  Surgeon: Wallace Going, DO;  Location: [place];  Service: Plastics;  Laterality: N/A;   APPLICATION OF WOUND VAC N/A 09/26/2018   Procedure: Vac placement;  Surgeon: Wallace Going, DO;  Location: [place];  Service: Plastics;  Laterality: N/A;   BACK SURGERY  1982   BRONCHOSCOPY  2001   CATARACT EXTRACTION, BILATERAL  09/2019, 10/2019   CHOLECYSTECTOMY  1984   INCISION AND DRAINAGE OF WOUND N/A 09/26/2018   Procedure: Debridement of spine wound;  Surgeon: Wallace Going, DO;  Location: [place];  Service: Plastics;  Laterality: N/A;   KNEE SURGERY Bilateral 08/27/2010 (L) and 01/11/2011 (R)   LUMBAR LAMINECTOMY/DECOMPRESSION MICRODISCECTOMY N/A 07/03/2018   Procedure: Lumbar Three  to Lumbar Five Laminectomy;  Surgeon: Judith Part, MD;  Location: [place];  Service: Neurosurgery;  Laterality: N/A;   LUMBAR WOUND DEBRIDEMENT N/A 08/20/2018   Procedure: POSTERIOR LUMBAR SPINAL WOUND DEBRIDEMENT AND REVISION;  Surgeon: Judith Part, MD;  Location: [place];  Service: Neurosurgery;  Laterality: N/A;  POSTERIOR LUMBAR SPINAL WOUND REVISION   miscarrage  1962   OVARIAN CYST SURGERY  1968   thymus tumor     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   TONSILLECTOMY     Family History  Problem Relation Age of Onset   Alzheimer's disease Mother    Heart disease Mother    Heart disease Father    Liver disease Father    Cancer Brother    Arthritis Son    Colon polyps Brother    Colon cancer Neg Hx    Esophageal cancer Neg Hx    Kidney disease Neg Hx    Stomach cancer Neg Hx    Rectal cancer Neg Hx    Social History   Socioeconomic History   Marital status: Widowed    Spouse name: Not on file   Number of children: 2   Years of education: 14   Highest education level: Not on file  Occupational History   Occupation: Retired  Tobacco Use   Smoking status: Former    Packs/day: 0.10    Years: 10.00    Pack years: 1.00    Types: Cigarettes    Quit date: 12/07/1979    Years since quitting: 41.2   Smokeless tobacco: Never  Vaping Use   Vaping Use: Never used  Substance and Sexual Activity   Alcohol use: No    Alcohol/week: 0.0 standard drinks   Drug use: No   Sexual activity: Not Currently  Other Topics Concern   Not on file  Social History Narrative   Walks with cane   Right handed    Caffeine use: Coffee (2 cups every morning)   Tea: sometimes   Soda: none   Social Determinants of Radio broadcast assistant Strain: Not on file  Food Insecurity: Not on file  Transportation Needs: Not on file  Physical Activity: Not on file  Stress: Not on file  Social Connections: Not on file  Tobacco Counseling Counseling given: Not Answered   Clinical  Intake:  Pre-visit preparation completed: No  Pain Score: 7  Pain Type: Chronic pain Pain Descriptors / Indicators: Aching     BMI - recorded: 37.88 Nutritional Status: BMI > 30  Obese Nutritional Risks: None Diabetes: Yes (reports CBG 300's) CBG done?: No Did pt. bring in CBG monitor from home?: No  How often do you need to have someone help you when you read instructions, pamphlets, or other written materials from your doctor or pharmacy?: 1 - Never What is the last grade level you completed in school?: [employer]  Diabetic?Yes   Interpreter Needed?: No  Information entered by :: Ceri Mayer,FNP-C   Activities of Daily Living In your present state of health, do you have any difficulty performing the following activities: 02/10/2021  Hearing? Y  Vision? N  Difficulty concentrating or making decisions? N  Walking or climbing stairs? N  Dressing or bathing? N  Doing errands, shopping? N  Preparing Food and eating ? Y  Comment son  Using the Toilet? N  In the past six months, have you accidently leaked urine? Y  Comment wears diaper  Do you have problems with loss of bowel control? N  Managing your Medications? Y  Comment son  Managing your Finances? N  Housekeeping or managing your Housekeeping? Y  Comment son assist  Some recent data might be hidden    Patient Care Team: Rajesh Wyss, Nelda Bucks, NP as PCP - General (Family Medicine) Bo Merino, MD (Rheumatology) Al[place]imer, Legrand Como, MD as Attending Physician (Endocrinology) Newt Minion, MD as Consulting Physician (Orthopedic Surgery)  Indicate any recent Medical Services you may have received from other than [facility] providers in the past year (date may be approximate).     Assessment:   This is a routine wellness examination for Kellye.  Hearing/Vision screen Hearing Screening - Comments:: Some hearing concerns. Patient doesn't wear hearing aids.  Vision Screening - Comments:: No Vision Concerns.  Patient wears prescription glasses/reading glasses. Patient last eye exam was April 2022.  Dietary issues and exercise activities discussed: Current Exercise Habits: The patient does not participate in regular exercise at present, Exercise limited by: Other - see comments (generalized pain)   Goals Addressed             This Visit's Progress    Patient Stated   Not on track    Patient would like to get back into church.      Patient Stated       Would like to loss weight under 225 lbs         Depression Screen PHQ 2/9 Scores 02/10/2021 01/19/2021 10/15/2020 10/01/2020 07/23/2020 06/19/2020 06/15/2020  PHQ - 2 Score 0 0 0 0 0 0 0  PHQ- 9 Score - - - - - - -    Fall Risk Fall Risk  02/10/2021 01/19/2021 12/09/2020 10/15/2020 10/01/2020  Falls in the past year? 0 0 0 0 0  Number falls in past yr: 0 0 0 0 0  Comment - - - - -  Injury with Fall? 0 0 0 0 0  Risk for fall due to : No Fall Risks - - - -  Follow up - - - - -    [place]:  Any stairs in or around the home? Yes  If so, are there any without handrails? Yes  Home free of loose throw rugs in walkways, pet beds, electrical cords,  etc? No  Adequate lighting in your home to reduce risk of falls? No   ASSISTIVE DEVICES UTILIZED TO PREVENT FALLS:  Life alert? No  Use of a cane, walker or w/c? Yes  Grab bars in the bathroom? Yes  Shower chair or bench in shower? Yes  Elevated toilet seat or a handicapped toilet? No   TIMED UP AND GO:  Was the test performed? No .  Length of time to ambulate 10 feet: N/A sec.   Gait slow and steady with assistive device  Cognitive Function: MMSE - Mini Mental State Exam 10/05/2017 07/07/2016  Orientation to time 5 5  Orientation to Place 5 5  Registration 3 3  Attention/ Calculation 5 5  Recall 3 3  Language- name 2 objects 2 2  Language- repeat 1 1  Language- follow 3 step command 3 3  Language- read & follow direction 1 1  Write a sentence 1  1  Copy design 1 1  Total score 30 30     6CIT Screen 02/10/2021 01/14/2020 01/11/2019  What Year? 0 points 0 points 0 points  What month? 0 points 0 points 0 points  What time? 0 points 0 points 0 points  Count back from 20 0 points 0 points 0 points  Months in reverse 0 points 0 points 0 points  Repeat phrase 4 points 0 points 0 points  Total Score 4 0 0    Immunizations Immunization History  Administered Date(s) Administered   Influenza-Unspecified 06/10/2011   PFIZER(Purple Top)SARS-COV-2 Vaccination 11/13/2019, 11/30/2019   Pneumococcal Conjugate-13 07/07/2016   Pneumococcal Polysaccharide-23 11/21/2003, 10/05/2017   Tdap 05/28/2016    TDAP status: Up to date  Flu Vaccine status: Declined, Education has been provided regarding the importance of this vaccine but patient still declined. Advised may receive this vaccine at local pharmacy or Health Dept. Aware to provide a copy of the vaccination record if obtained from local pharmacy or Health Dept. Verbalized acceptance and understanding.  Pneumococcal vaccine status: Up to date  Covid-19 vaccine status: Information provided on how to obtain vaccines.   Qualifies for Shingles Vaccine? Yes   Zostavax completed No   Shingrix Completed?: No.    Education has been provided regarding the importance of this vaccine. Patient has been advised to call insurance company to determine out of pocket expense if they have not yet received this vaccine. Advised may also receive vaccine at local pharmacy or Health Dept. Verbalized acceptance and understanding.  Screening Tests Health Maintenance  Topic Date Due   Zoster Vaccines- Shingrix (1 of 2) Never done   COVID-19 Vaccine (3 - Pfizer risk series) 12/28/2019   OPHTHALMOLOGY EXAM  10/02/2020   HEMOGLOBIN A1C  06/25/2021   FOOT EXAM  10/15/2021   DEXA SCAN  09/20/2025   TETANUS/TDAP  05/28/2026   PNA vac Low Risk Adult  Completed   HPV VACCINES  Aged Out   INFLUENZA VACCINE   Discontinued    Health Maintenance  Health Maintenance Due  Topic Date Due   Zoster Vaccines- Shingrix (1 of 2) Never done   COVID-19 Vaccine (3 - Pfizer risk series) 12/28/2019   OPHTHALMOLOGY EXAM  10/02/2020    Colorectal cancer screening: No longer required.   Mammogram status: Completed 08/01/2020. Repeat every year  Bone Density status: Completed 05/28/2016. Results reflect: Bone density results: OSTEOPOROSIS. Repeat every 2 years.  Lung Cancer Screening: (Low Dose CT Chest recommended if Age 21-80 years, 30 pack-year currently smoking OR have quit w/in 15years.)  does not qualify.   Lung Cancer Screening Referral: No   Additional Screening:  Hepatitis C Screening: does not qualify; Completed No   Vision Screening: Recommended annual ophthalmology exams for early detection of glaucoma and other disorders of the eye. Is the patient up to date with their annual eye exam?  Yes  Who is the provider or what is the name of the office in which the patient attends annual eye exams? [facility]  If pt is not established with a provider, would they like to be referred to a provider to establish care? No .   Dental Screening: Recommended annual dental exams for proper oral hygiene  Community Resource Referral / Chronic Care Management: CRR required this visit?  No   CCM required this visit?  No      Plan:     I have personally reviewed and noted the following in the patient's chart:   Medical and social history Use of alcohol, tobacco or illicit drugs  Current medications and supplements including opioid prescriptions.  Functional ability and status Nutritional status Physical activity Advanced directives List of other physicians Hospitalizations, surgeries, and ER visits in previous 12 months Vitals Screenings to include cognitive, depression, and falls Referrals and appointments  In addition, I have reviewed and discussed with patient certain preventive  protocols, quality metrics, and best practice recommendations. A written personalized care plan for preventive services as well as general preventive health recommendations were provided to patient.     Sandrea Hughs, NP   02/10/2021   Nurse Notes:due for mammogram and Dexa scan in [place] at [facility]

## 2021-02-10 NOTE — Telephone Encounter (Signed)
Pharmacy requested refill.  Pended Rx and sent to [facility] for approval due to HIGH ALERT Warning.  

## 2021-02-11 ENCOUNTER — Telehealth: Payer: Self-pay

## 2021-02-11 NOTE — Telephone Encounter (Signed)
Kathlee Nations from [facility] called stating the patient has been with them for a few days but discharged on 21st after a few days and needs home health. [facility] entire staff has covid and reused her referral. Patient need PT and Skilled care and had no luck with [facility], [facility] (non medical)  or [facility] and wonders if there was anyone we could recommend. To [facility].  [phone]

## 2021-02-11 NOTE — Telephone Encounter (Signed)
She has appointment in the morning for face to face then will place order.

## 2021-02-11 NOTE — Telephone Encounter (Signed)
Patient has an appointment tomorrow 02/12/2021.will let Kathyrn Lass our referral Co-ordinate check what other home health Agency are available.

## 2021-02-11 NOTE — Telephone Encounter (Signed)
[facility] takes [place] which is the biggest milestone in getting home health for [place].  We have this particular issue with all [employer] mcr pts, but I'm hopeful that [facility] should be able to help [place]  If you would like to put in referral, I will be glad to send it over to them.  Thanks,  Vilinda Blanks

## 2021-02-12 ENCOUNTER — Ambulatory Visit (INDEPENDENT_AMBULATORY_CARE_PROVIDER_SITE_OTHER): Payer: Medicare Other | Admitting: Family

## 2021-02-12 ENCOUNTER — Encounter: Payer: Self-pay | Admitting: Family

## 2021-02-12 ENCOUNTER — Other Ambulatory Visit: Payer: Medicare Other

## 2021-02-12 ENCOUNTER — Other Ambulatory Visit: Payer: Self-pay

## 2021-02-12 VITALS — BP 130/60 | HR 87 | Temp 97.3°F | Resp 18 | Ht 66.0 in | Wt 232.6 lb

## 2021-02-12 DIAGNOSIS — E785 Hyperlipidemia, unspecified: Secondary | ICD-10-CM | POA: Diagnosis not present

## 2021-02-12 DIAGNOSIS — E1142 Type 2 diabetes mellitus with diabetic polyneuropathy: Secondary | ICD-10-CM | POA: Diagnosis not present

## 2021-02-12 DIAGNOSIS — Z794 Long term (current) use of insulin: Secondary | ICD-10-CM | POA: Diagnosis not present

## 2021-02-12 DIAGNOSIS — R350 Frequency of micturition: Secondary | ICD-10-CM | POA: Diagnosis not present

## 2021-02-12 DIAGNOSIS — E1169 Type 2 diabetes mellitus with other specified complication: Secondary | ICD-10-CM | POA: Diagnosis not present

## 2021-02-12 DIAGNOSIS — N3941 Urge incontinence: Secondary | ICD-10-CM

## 2021-02-12 DIAGNOSIS — R531 Weakness: Secondary | ICD-10-CM

## 2021-02-12 DIAGNOSIS — R2681 Unsteadiness on feet: Secondary | ICD-10-CM

## 2021-02-12 DIAGNOSIS — E039 Hypothyroidism, unspecified: Secondary | ICD-10-CM | POA: Diagnosis not present

## 2021-02-12 MED ORDER — CRANBERRY 475 MG PO CAPS: 475.0000 mg | ORAL_CAPSULE | Freq: Two times a day (BID) | ORAL | 3 refills | Status: DC

## 2021-02-12 NOTE — Progress Notes (Signed)
Provider: Marlowe Sax FNP-C   Leianna Barga, Nelda Bucks, NP  Patient Care Team: Valia Wingard, Nelda Bucks, NP as PCP - General (Family Medicine) Bo Merino, MD (Rheumatology) Altheimer, Legrand Como, MD as Attending Physician (Endocrinology) Newt Minion, MD as Consulting Physician (Orthopedic Surgery)  Extended Emergency Contact Information Primary Emergency Contact: [facility] Address: [address]          [place], [place] [address] Johnnette Litter of [place] Phone: [phone] Relation: Son Secondary Emergency Contact: Marlana Latus, [place] Mobile Phone: [phone] Relation: Son  Code Status:  Full Code  Goals of care: Advanced Directive information Advanced Directives 02/12/2021  Does Patient Have a Medical Advance Directive? Yes  Type of Advance Directive Living will  Does patient want to make changes to medical advance directive? No - Patient declined  Copy of [place] in Chart? -  Would patient like information on creating a medical advance directive? -  Pre-existing out of facility DNR order (yellow form or pink MOST form) -     Chief Complaint  Patient presents with   Medical Management of Chronic Issues    4 month follow up   Health Maintenance    Discuss the need for Eye exam.   Immunizations    Discuss the need for Covid Booster    HPI:  Pt is a 80 y.o. female seen today for 4 month follow up for medical management of chronic diseases.she is here with son Ovid Curd who is the Care giver.She has a medical history of Hypertension,type 2 DM with polyneuropathy,COPD,GERD,Hypothyroidism,Hyperlipidemia,DDD lumbar,Osteoarthritis of both knees,Rheumatoid Arthritis,urine incontinence,Morbid obesity,Insomnia, fibromyalgia,major Depression,chronic pain syndrome among others.   She states post hospitalization [facility] from 02/04/2021 -02/09/2021 for sepsis due to UTI.she was  discharged on Cefdinir 300 mg capsule every 12 hrs x 10 days. Patient's hospital after visit summary reviewed.No other medical records available for review. Medication reviewed and reconciled.  States feeling much better since hospital discharge but still has generalized weakness requiring assistance with her ADL's.Son request Home health Nurse assistant and Physical therapy.States Home health was ordered during hospital discharge but all home health Agency staff are out with COVID-19.will need another Home health agency.  Type 2 DM - CBG has been elevated since hospitalization.did not bring log to visit for review today. She is due for her annual eye exam.son will schedule appointment once patient generalized weakness improves.    Transfers to wheel chair and goes to the bathroom.request Pure wick female catheter to use at bedtime. Had pure wick during recent hospital admission which help her to sleep well at night without having to get up frequently to use the bathroom.     Past Medical History:  Diagnosis Date   Abnormality of gait 04/19/2016   Acute infective polyneuritis ([place]) 2002   Allergic rhinitis due to pollen    Chronic pain syndrome    COPD (chronic obstructive pulmonary disease) (HCC)    chronic bronchitis   Depressive disorder, not elsewhere classified    Diabetes mellitus without complication ([place])    Diaphragmatic hernia without mention of obstruction or gangrene    Dyslipidemia    Fibromyalgia    GERD (gastroesophageal reflux disease)    with h/o esophagitis   Guillain-Barre syndrome ([place])    History of benign thymus tumor    Insomnia, unspecified    Lumbar spinal stenosis 03/30/2018   L4-5 level, severe   Miscarriage 1962  Mixed hyperlipidemia    Morbid obesity (HCC)    Osteoporosis    Pneumonia    Polyneuropathy in diabetes(357.2)    Rheumatoid arthritis(714.0)    Spondylosis, lumbosacral    Spontaneous ecchymoses    Type II or unspecified type diabetes  mellitus with peripheral circulatory disorders, uncontrolled(250.72)    Unspecified chronic bronchitis (HCC)    Unspecified essential hypertension    Unspecified hypothyroidism    Unspecified pruritic disorder    Unspecified urinary incontinence    Past Surgical History:  Procedure Laterality Date   ABDOMINAL HYSTERECTOMY  1974   abdominal tumor  2002   APPENDECTOMY     APPLICATION OF A-CELL OF BACK N/A 09/26/2018   Procedure: With Acell;  Surgeon: Wallace Going, DO;  Location: [facility];  Service: Plastics;  Laterality: N/A;   APPLICATION OF WOUND VAC N/A 09/26/2018   Procedure: Vac placement;  Surgeon: Wallace Going, DO;  Location: [facility];  Service: Plastics;  Laterality: N/A;   BACK SURGERY  1982   BRONCHOSCOPY  2001   CATARACT EXTRACTION, BILATERAL  09/2019, 10/2019   CHOLECYSTECTOMY  1984   INCISION AND DRAINAGE OF WOUND N/A 09/26/2018   Procedure: Debridement of spine wound;  Surgeon: Wallace Going, DO;  Location: [facility];  Service: Plastics;  Laterality: N/A;   KNEE SURGERY Bilateral 08/27/2010 (L) and 01/11/2011 (R)   LUMBAR LAMINECTOMY/DECOMPRESSION MICRODISCECTOMY N/A 07/03/2018   Procedure: Lumbar Three to Lumbar Five Laminectomy;  Surgeon: Judith Part, MD;  Location: [facility];  Service: Neurosurgery;  Laterality: N/A;   LUMBAR WOUND DEBRIDEMENT N/A 08/20/2018   Procedure: POSTERIOR LUMBAR SPINAL WOUND DEBRIDEMENT AND REVISION;  Surgeon: Judith Part, MD;  Location: [facility];  Service: Neurosurgery;  Laterality: N/A;  POSTERIOR LUMBAR SPINAL WOUND REVISION   miscarrage  1962   OVARIAN CYST SURGERY  1968   thymus tumor     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   TONSILLECTOMY      Allergies  Allergen Reactions   Influenza Vaccines Other (See Comments)    "h/o Guillain Barre; dr's told me years ago never to take another flu shot as it could relapse Rosalee Kaufman; has to do with when vaccine being changed to H1N1 virus"   Penicillins Hives, Itching, Swelling and Rash     Has patient had a PCN reaction causing immediate rash, facial/tongue/throat swelling, SOB or lightheadedness with hypotension:Unknown Has patient had a PCN reaction causing severe rash involving mucus membranes or skin necrosis: Unknown Has patient had a PCN reaction that required hospitalization:Unknown Has patient had a PCN reaction occurring within the last 10 years: Unknown If all of the above answers are "NO", then may proceed with Cephalosporin use.    Statins Other (See Comments)    Myopathy, transaminitis   Sulfamethoxazole-Trimethoprim Itching    Allergies as of 02/12/2021       Reactions   Influenza Vaccines Other (See Comments)   "h/o Guillain Barre; dr's told me years ago never to take another flu shot as it could relapse Rosalee Kaufman; has to do with when vaccine being changed to H1N1 virus"   Penicillins Hives, Itching, Swelling, Rash   Has patient had a PCN reaction causing immediate rash, facial/tongue/throat swelling, SOB or lightheadedness with hypotension:Unknown Has patient had a PCN reaction causing severe rash involving mucus membranes or skin necrosis: Unknown Has patient had a PCN reaction that required hospitalization:Unknown Has patient had a PCN reaction occurring within the last 10 years: Unknown If all of the  above answers are "NO", then may proceed with Cephalosporin use.   Statins Other (See Comments)   Myopathy, transaminitis   Sulfamethoxazole-trimethoprim Itching        Medication List        Accurate as of February 12, 2021 10:28 AM. If you have any questions, ask your nurse or doctor.          acetaminophen 325 MG tablet Commonly known as: TYLENOL Take 650 mg by mouth every 6 (six) hours as needed (for pain.).   ALKA-SELTZER PLUS COLD PO Take by mouth as needed.   amitriptyline 25 MG tablet Commonly known as: ELAVIL TAKE 1 TABLET(25 MG) BY MOUTH AT BEDTIME   amLODipine 10 MG tablet Commonly known as: NORVASC TAKE 1 TABLET(10 MG) BY  MOUTH DAILY   aspirin EC 81 MG tablet Take 81 mg by mouth 2 (two) times a week.   B-D UF III MINI PEN NEEDLES 31G X 5 MM Misc Generic drug: Insulin Pen Needle USE AS DIRECTED   cefdinir 300 MG capsule Commonly known as: OMNICEF Take 300 mg by mouth in the morning and at bedtime.   cholecalciferol 25 MCG (1000 UNIT) tablet Commonly known as: VITAMIN D3 Take 1,000 Units by mouth daily.   docusate sodium 100 MG capsule Commonly known as: COLACE Take 100 mg by mouth daily as needed (constipation.).   ezetimibe 10 MG tablet Commonly known as: ZETIA Take 1 tablet (10 mg total) by mouth daily.   folic acid 1 MG tablet Commonly known as: FOLVITE TAKE 2 TABLETS BY MOUTH  DAILY   furosemide 20 MG tablet Commonly known as: LASIX TAKE 1 TABLET BY MOUTH TWICE DAILY AS NEEDED FOR 3 POUND WEIGHT GAIN IN ONE DAY OR 5 POUNDS IN ONE WEEK   gabapentin 600 MG tablet Commonly known as: NEURONTIN TAKE 1 TABLET(600 MG) BY MOUTH THREE TIMES DAILY   Gemtesa 75 MG Tabs Generic drug: Vibegron Take 1 tablet by mouth daily.   HumaLOG KwikPen 200 UNIT/ML KwikPen Generic drug: insulin lispro Max daily 80 units   ipratropium-albuterol 0.5-2.5 (3) MG/3ML Soln Commonly known as: DUONEB Take 3 mLs by nebulization every 6 (six) hours as needed.   levothyroxine 112 MCG tablet Commonly known as: SYNTHROID Take 1 tablet (112 mcg total) by mouth daily.   lidocaine 5 % Commonly known as: LIDODERM Place 1 patch onto the skin daily. Remove & Discard patch within 12 hours or as directed by MD   lisinopril 20 MG tablet Commonly known as: ZESTRIL Take one tablet by mouth once daily.   loperamide 2 MG tablet Commonly known as: IMODIUM A-D Take 2 mg by mouth 4 (four) times daily as needed for diarrhea or loose stools.   Melatonin 10 MG Tabs Take 10 mg by mouth at bedtime.   metoprolol tartrate 50 MG tablet Commonly known as: LOPRESSOR Take one tablet by mouth twice daily.   MULTIVITAMIN ADULT  PO Take 1 tablet by mouth daily.   nystatin cream Commonly known as: MYCOSTATIN Apply 1 application topically 2 (two) times daily. To skin folds   nystatin powder Commonly known as: nystatin APPLY TOPICALLY THREE TIMES DAILY AS DIRECTED FOR 14 DAYS and prn yeast infection   omega-3 acid ethyl esters 1 g capsule Commonly known as: LOVAZA TAKE ONE CAPSULE BY MOUTH EVERY DAY   oxyCODONE-acetaminophen 10-325 MG tablet Commonly known as: Percocet Take 1 tablet by mouth every 6 (six) hours as needed for pain.   polyvinyl alcohol 1.4 % ophthalmic solution  Commonly known as: LIQUIFILM TEARS Place 1 drop into both eyes 4 (four) times daily as needed (for dry/irritated eyes).   potassium chloride 10 MEQ CR capsule Commonly known as: MICRO-K Take 2 capsules (20 mEq total) by mouth 2 (two) times daily as needed (when taking a dose of lasix.).   sodium chloride 0.65 % nasal spray Commonly known as: OCEAN Place 2 sprays into the nose as needed for congestion.   tiotropium 18 MCG inhalation capsule Commonly known as: SPIRIVA Place 1 capsule (18 mcg total) into inhaler and inhale daily.   tiZANidine 2 MG tablet Commonly known as: ZANAFLEX TAKE 1 TABLET(2 MG) BY MOUTH THREE TIMES DAILY   Toujeo SoloStar 300 UNIT/ML Solostar Pen Generic drug: insulin glargine (1 Unit Dial) INJECT 100 UNITS INTO THE SKIN EVERY NIGHT AT BEDTIME   triamterene-hydrochlorothiazide 37.5-25 MG tablet Commonly known as: MAXZIDE-25 TAKE 1 TABLET BY MOUTH  DAILY   venlafaxine XR 75 MG 24 hr capsule Commonly known as: EFFEXOR-XR TAKE 1 CAPSULE BY MOUTH EVERY DAY WITH BREAKFAST   Victoza 18 MG/3ML Sopn Generic drug: liraglutide ADMINISTER 1.8 MG UNDER THE SKIN DAILY FOR BLOOD SUGAR   Xeljanz 5 MG Tabs Generic drug: Tofacitinib Citrate TAKE 1 TABLET BY MOUTH  DAILY        Review of Systems  Constitutional:  Negative for appetite change, chills, fatigue, fever and unexpected weight change.  HENT:   Negative for congestion, dental problem, ear discharge, ear pain, facial swelling, hearing loss, nosebleeds, postnasal drip, rhinorrhea, sinus pressure, sinus pain, sneezing, sore throat, tinnitus and trouble swallowing.   Eyes:  Negative for pain, discharge, redness, itching and visual disturbance.  Respiratory:  Negative for cough, chest tightness, shortness of breath and wheezing.   Cardiovascular:  Negative for chest pain, palpitations and leg swelling.  Gastrointestinal:  Negative for abdominal distention, abdominal pain, blood in stool, constipation, diarrhea, nausea and vomiting.  Endocrine: Negative for cold intolerance, heat intolerance, polydipsia, polyphagia and polyuria.  Genitourinary:  Positive for frequency. Negative for difficulty urinating, dysuria, flank pain and urgency.       On cefdinir 300 mg  twice daily for recent urinary tract infection post hospitalization   Musculoskeletal:  Positive for arthralgias, back pain and gait problem. Negative for joint swelling, myalgias, neck pain and neck stiffness.  Skin:  Negative for color change, pallor, rash and wound.  Neurological:  Negative for dizziness, syncope, speech difficulty, weakness, light-headedness, numbness and headaches.  Hematological:  Does not bruise/bleed easily.  Psychiatric/Behavioral:  Negative for agitation, behavioral problems, confusion, hallucinations, self-injury, sleep disturbance and suicidal ideas. The patient is not nervous/anxious.    Immunization History  Administered Date(s) Administered   Influenza-Unspecified 06/10/2011   PFIZER(Purple Top)SARS-COV-2 Vaccination 11/13/2019, 11/30/2019   Pneumococcal Conjugate-13 07/07/2016   Pneumococcal Polysaccharide-23 11/21/2003, 10/05/2017   Tdap 05/28/2016   Pertinent  Health Maintenance Due  Topic Date Due   OPHTHALMOLOGY EXAM  10/02/2020   HEMOGLOBIN A1C  06/25/2021   FOOT EXAM  10/15/2021   DEXA SCAN  09/20/2025   PNA vac Low Risk Adult  Completed    INFLUENZA VACCINE  Discontinued   Fall Risk  02/12/2021 02/10/2021 01/19/2021 12/09/2020 10/15/2020  Falls in the past year? 0 0 0 0 0  Number falls in past yr: 0 0 0 0 0  Comment - - - - -  Injury with Fall? 0 0 0 0 0  Risk for fall due to : No Fall Risks No Fall Risks - - -  Follow  up - - - - -   Functional Status Survey:    Vitals:   02/12/21 0959  BP: 130/60  Pulse: 87  Resp: 18  Temp: (!) 97.3 F (36.3 C)  SpO2: 95%  Weight: 232 lb 9.6 oz (105.5 kg)  Height: 5\' 6"  (1.676 m)   Body mass index is 37.54 kg/m. Physical Exam Vitals reviewed.  Constitutional:      General: She is not in acute distress.    Appearance: Normal appearance. She is normal weight. She is not ill-appearing or diaphoretic.  HENT:     Head: Normocephalic.     Right Ear: Tympanic membrane, ear canal and external ear normal. There is no impacted cerumen.     Left Ear: Tympanic membrane, ear canal and external ear normal. There is no impacted cerumen.     Ears:     Comments: HOH    Nose: Nose normal. No congestion or rhinorrhea.     Mouth/Throat:     Mouth: Mucous membranes are moist.     Pharynx: Oropharynx is clear. No oropharyngeal exudate or posterior oropharyngeal erythema.  Eyes:     General: No scleral icterus.       Right eye: No discharge.        Left eye: No discharge.     Extraocular Movements: Extraocular movements intact.     Conjunctiva/sclera: Conjunctivae normal.     Pupils: Pupils are equal, round, and reactive to light.  Neck:     Vascular: No carotid bruit.  Cardiovascular:     Rate and Rhythm: Normal rate and regular rhythm.     Pulses: Normal pulses.     Heart sounds: Normal heart sounds. No murmur heard.   No friction rub. No gallop.  Pulmonary:     Effort: Pulmonary effort is normal. No respiratory distress.     Breath sounds: Normal breath sounds. No wheezing, rhonchi or rales.  Chest:     Chest wall: No tenderness.  Abdominal:     General: Bowel sounds are normal.  There is no distension.     Palpations: Abdomen is soft. There is no mass.     Tenderness: There is no abdominal tenderness. There is no right CVA tenderness, left CVA tenderness, guarding or rebound.  Musculoskeletal:        General: No swelling or tenderness.     Cervical back: Normal range of motion. No rigidity or tenderness.     Right lower leg: No edema.     Left lower leg: No edema.     Comments: Unsteady gait   Lymphadenopathy:     Cervical: No cervical adenopathy.  Skin:    General: Skin is warm and dry.     Coloration: Skin is not pale.     Findings: No bruising, erythema, lesion or rash.  Neurological:     Mental Status: She is alert and oriented to person, place, and time.     Cranial Nerves: No cranial nerve deficit.     Sensory: No sensory deficit.     Coordination: Coordination normal.     Gait: Gait abnormal.     Comments: Generalized weakness   Psychiatric:        Mood and Affect: Mood normal.        Speech: Speech normal.        Behavior: Behavior normal.        Thought Content: Thought content normal.        Judgment: Judgment normal.  Labs reviewed: Recent Labs    10/12/20 0852 01/19/21 1016 01/25/21 1037  NA 142 141 141  K 4.0 4.1 3.9  CL 100 103 102  CO2 30 29 28   GLUCOSE 249* 92 112*  BUN 12 19 16   CREATININE 0.88 0.89* 0.92  CALCIUM 9.8 9.6 9.7   Recent Labs    10/22/20 1048 01/19/21 1016 01/25/21 1037  AST 35 22 26  ALT 19 12 18   ALKPHOS  --   --  96  BILITOT 0.4 0.3 0.4  PROT 7.0 6.9 7.4  ALBUMIN  --   --  3.5   Recent Labs    09/10/20 1035 01/19/21 1016 01/25/21 1037  WBC 15.0* 15.8* 16.1*  NEUTROABS 11,925* 10,618* 12.8*  HGB 13.7 13.9 13.8  HCT 41.8 44.4 42.3  MCV 84.6 85.5 84.8  PLT 334 327 293   Lab Results  Component Value Date   TSH 0.77 12/23/2020   Lab Results  Component Value Date   HGBA1C 8.8 (A) 12/23/2020   Lab Results  Component Value Date   CHOL 141 10/12/2020   HDL 33 (L) 10/12/2020   LDLCALC  83 10/12/2020   TRIG 157 (H) 10/12/2020   CHOLHDL 4.3 10/12/2020    Significant Diagnostic Results in last 30 days:  No results found.  Assessment/Plan 1. Generalized weakness Status post hospitalization with sepsis due to UTI  Continue on cefdinir as prescribed.will have her work with Physical therapist for ROM,exercise,gait stability and muscle strengthening. - Ambulatory referral to [facility]  2. Unsteady gait Fall and safety precaution advised Physical Therapy as above  - Ambulatory referral to Home Health  3. Urinary frequency Complete antibiotics as above Will add cranberry for UTI prophylaxis  - Cranberry 475 MG CAPS; Take 1 capsule (475 mg total) by mouth 2 (two) times daily.  Dispense: 60 capsule; Refill: 3 - additional education information on UTI provided on AVS - Ambulatory referral to [facility]  4. Urinary incontinence, urge Request Purewick female catheter for use at bedtime to prevent frequent getting up to use the bathroom.  - continue on vibergron for OAB  - Ambulatory referral to [facility] - For home use only DME Other see comment  5. Type 2 diabetes mellitus with diabetic polyneuropathy, with long-term current use of insulin (HCC) Reports CBG slightly elevated due to recent UTI  - continue on Humalog,Toujeo and victoza  - continue on ASA and ACE inhibitor  - continue on Gabapentin for Neuropathy  - son will schedule for annual eye exam with Ophthalmology  - Ambulatory referral to [facility]  6. Acquired hypothyroidism Lab Results  Component Value Date   TSH 0.77 12/23/2020  Continue on levothyroxine 112 mcg tablet daily on empty stomach  - Ambulatory referral to [facility]  7. Hyperlipidemia associated with type 2 diabetes mellitus (HCC) Latest LDL at goal  Continue on Zetia  Dietary modification and exercise as tolerated advised  - Ambulatory referral to [facility] staff Communication: Reviewed plan of care with patient and  son verbalized understanding   Labs/tests ordered: None   Next Appointment : 6 months for medical management of chronic issues.Fasting Labs prior to visit.    Sandrea Hughs, NP

## 2021-02-12 NOTE — Patient Instructions (Signed)
Urinary Tract Infection, Adult A urinary tract infection (UTI) is an infection of any part of the urinary tract. The urinary tract includes the kidneys, ureters, bladder, and urethra. These organs make, store, and get rid of urine in the body. An upper UTI affects the ureters and kidneys. A lower UTI affects the bladder and urethra. What are the causes? Most urinary tract infections are caused by bacteria in your genital area around your urethra, where urine leaves your body. These bacteria grow and cause inflammation of your urinary tract. What increases the risk? You are more likely to develop this condition if: You have a urinary catheter that stays in place. You are not able to control when you urinate or have a bowel movement (incontinence). You are female and you: Use a spermicide or diaphragm for birth control. Have low estrogen levels. Are pregnant. You have certain genes that increase your risk. You are sexually active. You take antibiotic medicines. You have a condition that causes your flow of urine to slow down, such as: An enlarged prostate, if you are female. Blockage in your urethra. A kidney stone. A nerve condition that affects your bladder control (neurogenic bladder). Not getting enough to drink, or not urinating often. You have certain medical conditions, such as: Diabetes. A weak disease-fighting system (immunesystem). Sickle cell disease. Gout. Spinal cord injury. What are the signs or symptoms? Symptoms of this condition include: Needing to urinate right away (urgency). Frequent urination. This may include small amounts of urine each time you urinate. Pain or burning with urination. Blood in the urine. Urine that smells bad or unusual. Trouble urinating. Cloudy urine. Vaginal discharge, if you are female. Pain in the abdomen or the lower back. You may also have: Vomiting or a decreased appetite. Confusion. Irritability or tiredness. A fever or  chills. Diarrhea. The first symptom in older adults may be confusion. In some cases, they may not have any symptoms until the infection has worsened. How is this diagnosed? This condition is diagnosed based on your medical history and a physical exam. You may also have other tests, including: Urine tests. Blood tests. Tests for STIs (sexually transmitted infections). If you have had more than one UTI, a cystoscopy or imaging studies may be done to determine the cause of the infections. How is this treated? Treatment for this condition includes: Antibiotic medicine. Over-the-counter medicines to treat discomfort. Drinking enough water to stay hydrated. If you have frequent infections or have other conditions such as a kidney stone, you may need to see a health care provider who specializes in the urinary tract (urologist). In rare cases, urinary tract infections can cause sepsis. Sepsis is a life-threatening condition that occurs when the body responds to an infection. Sepsis is treated in the hospital with IV antibiotics, fluids, and other medicines. Follow these instructions at home: Medicines Take over-the-counter and prescription medicines only as told by your health care provider. If you were prescribed an antibiotic medicine, take it as told by your health care provider. Do not stop using the antibiotic even if you start to feel better. General instructions Make sure you: Empty your bladder often and completely. Do not hold urine for long periods of time. Empty your bladder after sex. Wipe from front to back after urinating or having a bowel movement if you are female. Use each tissue only one time when you wipe. Drink enough fluid to keep your urine pale yellow. Keep all follow-up visits. This is important. Contact a health care provider   if: Your symptoms do not get better after 1-2 days. Your symptoms go away and then return. Get help right away if: You have severe pain in your  back or your lower abdomen. You have a fever or chills. You have nausea or vomiting. Summary A urinary tract infection (UTI) is an infection of any part of the urinary tract, which includes the kidneys, ureters, bladder, and urethra. Most urinary tract infections are caused by bacteria in your genital area. Treatment for this condition often includes antibiotic medicines. If you were prescribed an antibiotic medicine, take it as told by your health care provider. Do not stop using the antibiotic even if you start to feel better. Keep all follow-up visits. This is important. This information is not intended to replace advice given to you by your health care provider. Make sure you discuss any questions you have with your health care provider. Document Revised: 03/20/2020 Document Reviewed: 03/20/2020 Elsevier Patient Education  2022 [employer]  

## 2021-02-13 LAB — TSH: TSH: 2.82 mIU/L (ref 0.40–4.50)

## 2021-02-13 LAB — LIPID PANEL
Cholesterol: 127 mg/dL (ref <200)
HDL: 17 mg/dL — ABNORMAL LOW (ref >=50)
LDL Cholesterol (Calc): 76 mg/dL (calc)
Non-HDL Cholesterol (Calc): 110 mg/dL (calc) (ref <130)
Total CHOL/HDL Ratio: 7.5 (calc) — ABNORMAL HIGH (ref <5.0)
Triglycerides: 259 mg/dL — ABNORMAL HIGH (ref <150)

## 2021-02-13 LAB — CBC WITH DIFFERENTIAL/PLATELET
Absolute Monocytes: 778 cells/uL (ref 200–950)
Basophils Absolute: 65 cells/uL (ref 0–200)
Basophils Relative: 0.4 %
Eosinophils Absolute: 454 cells/uL (ref 15–500)
Eosinophils Relative: 2.8 %
HCT: 36.3 % (ref 35.0–45.0)
Hemoglobin: 11.4 g/dL — ABNORMAL LOW (ref 11.7–15.5)
Lymphs Abs: 1636 cells/uL (ref 850–3900)
MCH: 26.8 pg — ABNORMAL LOW (ref 27.0–33.0)
MCHC: 31.4 g/dL — ABNORMAL LOW (ref 32.0–36.0)
MCV: 85.4 fL (ref 80.0–100.0)
MPV: 10.2 fL (ref 7.5–12.5)
Monocytes Relative: 4.8 %
Neutro Abs: 13268 cells/uL — ABNORMAL HIGH (ref 1500–7800)
Neutrophils Relative %: 81.9 %
Platelets: 412 10*3/uL — ABNORMAL HIGH (ref 140–400)
RBC: 4.25 10*6/uL (ref 3.80–5.10)
RDW: 14.2 % (ref 11.0–15.0)
Total Lymphocyte: 10.1 %
WBC: 16.2 10*3/uL — ABNORMAL HIGH (ref 3.8–10.8)

## 2021-02-13 LAB — HEMOGLOBIN A1C
Hgb A1c MFr Bld: 7.7 % of total Hgb — ABNORMAL HIGH (ref <5.7)
Mean Plasma Glucose: 174 mg/dL
eAG (mmol/L): 9.7 mmol/L

## 2021-02-13 LAB — COMPLETE METABOLIC PANEL WITH GFR
AG Ratio: 1 (calc) (ref 1.0–2.5)
ALT: 12 U/L (ref 6–29)
AST: 16 U/L (ref 10–35)
Albumin: 3 g/dL — ABNORMAL LOW (ref 3.6–5.1)
Alkaline phosphatase (APISO): 113 U/L (ref 37–153)
BUN/Creatinine Ratio: 15 (calc) (ref 6–22)
BUN: 14 mg/dL (ref 7–25)
CO2: 29 mmol/L (ref 20–32)
Calcium: 8.9 mg/dL (ref 8.6–10.4)
Chloride: 98 mmol/L (ref 98–110)
Creat: 0.96 mg/dL — ABNORMAL HIGH (ref 0.60–0.88)
GFR, Est African American: 65 mL/min/{1.73_m2} (ref >=60)
GFR, Est Non African American: 56 mL/min/{1.73_m2} — ABNORMAL LOW (ref >=60)
Globulin: 2.9 g/dL (calc) (ref 1.9–3.7)
Glucose, Bld: 127 mg/dL — ABNORMAL HIGH (ref 65–99)
Potassium: 4.2 mmol/L (ref 3.5–5.3)
Sodium: 138 mmol/L (ref 135–146)
Total Bilirubin: 0.4 mg/dL (ref 0.2–1.2)
Total Protein: 5.9 g/dL — ABNORMAL LOW (ref 6.1–8.1)

## 2021-02-15 ENCOUNTER — Encounter: Payer: Medicare Other | Admitting: Registered Nurse

## 2021-02-16 DIAGNOSIS — E039 Hypothyroidism, unspecified: Secondary | ICD-10-CM | POA: Diagnosis not present

## 2021-02-16 DIAGNOSIS — R350 Frequency of micturition: Secondary | ICD-10-CM | POA: Diagnosis not present

## 2021-02-16 DIAGNOSIS — N3941 Urge incontinence: Secondary | ICD-10-CM

## 2021-02-16 DIAGNOSIS — E1169 Type 2 diabetes mellitus with other specified complication: Secondary | ICD-10-CM | POA: Diagnosis not present

## 2021-02-16 DIAGNOSIS — R531 Weakness: Secondary | ICD-10-CM | POA: Diagnosis not present

## 2021-02-16 DIAGNOSIS — E1142 Type 2 diabetes mellitus with diabetic polyneuropathy: Secondary | ICD-10-CM | POA: Diagnosis not present

## 2021-02-16 DIAGNOSIS — Z794 Long term (current) use of insulin: Secondary | ICD-10-CM | POA: Diagnosis not present

## 2021-02-16 DIAGNOSIS — E785 Hyperlipidemia, unspecified: Secondary | ICD-10-CM | POA: Diagnosis not present

## 2021-02-16 DIAGNOSIS — R2681 Unsteadiness on feet: Secondary | ICD-10-CM | POA: Diagnosis not present

## 2021-02-17 ENCOUNTER — Encounter: Payer: Medicare Other | Attending: Physical Medicine and Rehabilitation | Admitting: Registered Nurse

## 2021-02-17 ENCOUNTER — Other Ambulatory Visit: Payer: Self-pay

## 2021-02-17 VITALS — BP 127/76 | HR 66 | Temp 98.9°F

## 2021-02-17 DIAGNOSIS — Z5181 Encounter for therapeutic drug level monitoring: Secondary | ICD-10-CM | POA: Insufficient documentation

## 2021-02-17 DIAGNOSIS — M5416 Radiculopathy, lumbar region: Secondary | ICD-10-CM | POA: Diagnosis not present

## 2021-02-17 DIAGNOSIS — M961 Postlaminectomy syndrome, not elsewhere classified: Secondary | ICD-10-CM | POA: Insufficient documentation

## 2021-02-17 DIAGNOSIS — Z79891 Long term (current) use of opiate analgesic: Secondary | ICD-10-CM | POA: Diagnosis present

## 2021-02-17 DIAGNOSIS — M170 Bilateral primary osteoarthritis of knee: Secondary | ICD-10-CM | POA: Insufficient documentation

## 2021-02-17 DIAGNOSIS — G4701 Insomnia due to medical condition: Secondary | ICD-10-CM | POA: Insufficient documentation

## 2021-02-17 DIAGNOSIS — M7061 Trochanteric bursitis, right hip: Secondary | ICD-10-CM | POA: Insufficient documentation

## 2021-02-17 DIAGNOSIS — G894 Chronic pain syndrome: Secondary | ICD-10-CM | POA: Diagnosis present

## 2021-02-17 DIAGNOSIS — M7062 Trochanteric bursitis, left hip: Secondary | ICD-10-CM | POA: Insufficient documentation

## 2021-02-17 NOTE — Progress Notes (Signed)
Subjective:    Patient ID: Katherine Delgado, female    DOB: 30-Apr-1941, 80 y.o.   MRN: [id number]  HPI: Katherine Delgado is a 80 y.o. female who returns for follow up appointment for chronic pain and medication refill. She states her  pain is located in her lower back radiating into her bilateral hips L>R and bilateral lower extremities. Also reports bilateral knee pain and generalized pain. She rates her pain 8. Her current exercise regime is receiving home health therapy with [facility].  Katherine Delgado Morphine equivalent is 90.00  MME.   Katherine Delgado was short on her medication count, this was reviewed with Dr Ranell Patrick. She was educated on the Narcotic Policy and will have a warning letter mailed per Dr Ranell Patrick . She verbalizes understanding.   Last Oral Swab was Performed on 10/01/2020, it was consistent.   Family in room, all questions answered    Pain Inventory Average Pain 9 Pain Right Now 8 My pain is sharp, burning, dull, and aching  In the last 24 hours, has pain interfered with the following? General activity 10 Relation with others 10 Enjoyment of life 10 What TIME of day is your pain at its worst? morning  and night Sleep (in general) Poor  Pain is worse with: walking, bending, standing, and some activites Pain improves with: rest and medication Relief from Meds: 6  Family History  Problem Relation Age of Onset   Alzheimer's disease Mother    Heart disease Mother    Heart disease Father    Liver disease Father    Cancer Brother    Arthritis Son    Colon polyps Brother    Colon cancer Neg Hx    Esophageal cancer Neg Hx    Kidney disease Neg Hx    Stomach cancer Neg Hx    Rectal cancer Neg Hx    Social History   Socioeconomic History   Marital status: Widowed    Spouse name: Not on file   Number of children: 2   Years of education: 14   Highest education level: Not on file  Occupational History   Occupation: Retired  Tobacco Use   Smoking status:  Former    Packs/day: 0.10    Years: 10.00    Pack years: 1.00    Types: Cigarettes    Quit date: 12/07/1979    Years since quitting: 41.2   Smokeless tobacco: Never  Vaping Use   Vaping Use: Never used  Substance and Sexual Activity   Alcohol use: No    Alcohol/week: 0.0 standard drinks   Drug use: No   Sexual activity: Not Currently  Other Topics Concern   Not on file  Social History Narrative   Walks with cane   Right handed    Caffeine use: Coffee (2 cups every morning)   Tea: sometimes   Soda: none   Social Determinants of Health   Financial Resource Strain: Not on file  Food Insecurity: Not on file  Transportation Needs: Not on file  Physical Activity: Not on file  Stress: Not on file  Social Connections: Not on file   Past Surgical History:  Procedure Laterality Date   ABDOMINAL HYSTERECTOMY  1974   abdominal tumor  2002   APPENDECTOMY     APPLICATION OF A-CELL OF BACK N/A 09/26/2018   Procedure: With Acell;  Surgeon: Wallace Going, DO;  Location: [place];  Service: Plastics;  Laterality: N/A;   APPLICATION OF WOUND  VAC N/A 09/26/2018   Procedure: Vac placement;  Surgeon: Wallace Going, DO;  Location: [place];  Service: Plastics;  Laterality: N/A;   BACK SURGERY  1982   BRONCHOSCOPY  2001   CATARACT EXTRACTION, BILATERAL  09/2019, 10/2019   CHOLECYSTECTOMY  1984   INCISION AND DRAINAGE OF WOUND N/A 09/26/2018   Procedure: Debridement of spine wound;  Surgeon: Wallace Going, DO;  Location: [place];  Service: Plastics;  Laterality: N/A;   KNEE SURGERY Bilateral 08/27/2010 (L) and 01/11/2011 (R)   LUMBAR LAMINECTOMY/DECOMPRESSION MICRODISCECTOMY N/A 07/03/2018   Procedure: Lumbar Three to Lumbar Five Laminectomy;  Surgeon: Judith Part, MD;  Location: [place];  Service: Neurosurgery;  Laterality: N/A;   LUMBAR WOUND DEBRIDEMENT N/A 08/20/2018   Procedure: POSTERIOR LUMBAR SPINAL WOUND DEBRIDEMENT AND REVISION;  Surgeon: Judith Part, MD;   Location: [place];  Service: Neurosurgery;  Laterality: N/A;  POSTERIOR LUMBAR SPINAL WOUND REVISION   miscarrage  1962   OVARIAN CYST SURGERY  1968   thymus tumor     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   TONSILLECTOMY     Past Surgical History:  Procedure Laterality Date   ABDOMINAL HYSTERECTOMY  1974   abdominal tumor  2002   APPENDECTOMY     APPLICATION OF A-CELL OF BACK N/A 09/26/2018   Procedure: With Acell;  Surgeon: Wallace Going, DO;  Location: [place];  Service: Plastics;  Laterality: N/A;   APPLICATION OF WOUND VAC N/A 09/26/2018   Procedure: Vac placement;  Surgeon: Wallace Going, DO;  Location: [place];  Service: Plastics;  Laterality: N/A;   BACK SURGERY  1982   BRONCHOSCOPY  2001   CATARACT EXTRACTION, BILATERAL  09/2019, 10/2019   CHOLECYSTECTOMY  1984   INCISION AND DRAINAGE OF WOUND N/A 09/26/2018   Procedure: Debridement of spine wound;  Surgeon: Wallace Going, DO;  Location: [place];  Service: Plastics;  Laterality: N/A;   KNEE SURGERY Bilateral 08/27/2010 (L) and 01/11/2011 (R)   LUMBAR LAMINECTOMY/DECOMPRESSION MICRODISCECTOMY N/A 07/03/2018   Procedure: Lumbar Three to Lumbar Five Laminectomy;  Surgeon: Judith Part, MD;  Location: [place];  Service: Neurosurgery;  Laterality: N/A;   LUMBAR WOUND DEBRIDEMENT N/A 08/20/2018   Procedure: POSTERIOR LUMBAR SPINAL WOUND DEBRIDEMENT AND REVISION;  Surgeon: Judith Part, MD;  Location: [place];  Service: Neurosurgery;  Laterality: N/A;  POSTERIOR LUMBAR SPINAL WOUND REVISION   miscarrage  1962   OVARIAN CYST SURGERY  1968   thymus tumor     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   TONSILLECTOMY     Past Medical History:  Diagnosis Date   Abnormality of gait 04/19/2016   Acute infective polyneuritis ([place]) 2002   Allergic rhinitis due to pollen    Chronic pain syndrome    COPD (chronic obstructive pulmonary disease) (HCC)    chronic bronchitis   Depressive disorder, not elsewhere classified    Diabetes mellitus without  complication ([place])    Diaphragmatic hernia without mention of obstruction or gangrene    Dyslipidemia    Fibromyalgia    GERD (gastroesophageal reflux disease)    with h/o esophagitis   Guillain-Barre syndrome (HCC)    History of benign thymus tumor    Insomnia, unspecified    Lumbar spinal stenosis 03/30/2018   L4-5 level, severe   Miscarriage 1962   Mixed hyperlipidemia    Morbid obesity ([place])    Osteoporosis    Pneumonia    Polyneuropathy in diabetes(357.2)    Rheumatoid  arthritis(714.0)    Spondylosis, lumbosacral    Spontaneous ecchymoses    Type II or unspecified type diabetes mellitus with peripheral circulatory disorders, uncontrolled(250.72)    Unspecified chronic bronchitis (HCC)    Unspecified essential hypertension    Unspecified hypothyroidism    Unspecified pruritic disorder    Unspecified urinary incontinence    BP 127/76   Pulse 66   Temp 98.9 F (37.2 C) (Oral)   SpO2 96%   Opioid Risk Score:   Fall Risk Score:  `1  Depression screen PHQ 2/9  Depression screen [facility] 2/9 02/10/2021 01/19/2021 10/15/2020 10/01/2020 07/23/2020 06/19/2020 06/15/2020  Decreased Interest 0 0 0 0 0 0 0  Down, Depressed, Hopeless 0 0 0 0 0 0 0  PHQ - 2 Score 0 0 0 0 0 0 0  Altered sleeping - - - - - - -  Tired, decreased energy - - - - - - -  Change in appetite - - - - - - -  Feeling bad or failure about yourself  - - - - - - -  Trouble concentrating - - - - - - -  Moving slowly or fidgety/restless - - - - - - -  Suicidal thoughts - - - - - - -  PHQ-9 Score - - - - - - -  Difficult doing work/chores - - - - - - -  Some recent data might be hidden     Review of Systems  Respiratory:  Positive for cough and shortness of breath.   Musculoskeletal:  Positive for back pain and gait problem.       Shoulder blade pain Bilateral arm pain Buttocks pain Bilateral knee pain Bilateral leg pain Bilateral foot pain  Neurological:  Positive for weakness and numbness.  All other  systems reviewed and are negative.     Objective:   Physical Exam Vitals and nursing note reviewed.  Constitutional:      Appearance: Normal appearance.  Cardiovascular:     Rate and Rhythm: Normal rate and regular rhythm.     Pulses: Normal pulses.     Heart sounds: Normal heart sounds.  Pulmonary:     Effort: Pulmonary effort is normal.     Breath sounds: Normal breath sounds.  Musculoskeletal:     Cervical back: Normal range of motion and neck supple.     Comments: Normal Muscle Bulk and Muscle Testing Reveals:  Upper Extremities: Full ROM and Muscle Strength  5/5  Lumbar Paraspinal Tenderness: L-3-L-5 Bilateral Greater Trochanter Tenderness Lower Extremities: Decreased ROM and Muscle Strength 4/5 Bilateral Lower Extremities Flexion Produces Pain into her Extremities Arrived in wheelchair    Skin:    General: Skin is warm and dry.  Neurological:     Mental Status: She is alert and oriented to person, place, and time.  Psychiatric:        Mood and Affect: Mood normal.        Behavior: Behavior normal.         Assessment & Plan:  Failed Back Syndrome: Continue HEP as Tolerated. Continue to Monitor. She is receiving Home Health therapy at this time.  Lumbar Radiculitis: Continue Gabapentin. Continue to monitor. Continue HEP as Tolerated.  Bilateral Greater Trochanter Bursitis: Continue to Alternate Ice and Heat Therapy. Continue current medication regimen. Continue to monitor.  Bilateral Primary OA: Continue HEP as Tolerated. Continue to Monitor.  Insomnia: Continue Amitriptyline . Continue to Monitor.  Chronic Pain Syndrome: Refilled: Oxycodone 10/326 mg one tablet  every 6 hours as needed for pain #120. We will continue the opioid monitoring program, this consists of regular clinic visits, examinations, urine drug screen, pill counts as well as use of [place] Controlled Substance Reporting system. A 12 month History has been reviewed on the [place] Controlled  Substance Reporting System on 02/17/2021.   F/U in 1 month

## 2021-02-18 MED ORDER — OXYCODONE-ACETAMINOPHEN 10-325 MG PO TABS: 1.0000 | ORAL_TABLET | Freq: Four times a day (QID) | ORAL | 0 refills | Status: DC | PRN

## 2021-02-23 ENCOUNTER — Other Ambulatory Visit: Payer: Self-pay

## 2021-02-23 DIAGNOSIS — R2681 Unsteadiness on feet: Secondary | ICD-10-CM | POA: Diagnosis not present

## 2021-02-23 DIAGNOSIS — E039 Hypothyroidism, unspecified: Secondary | ICD-10-CM | POA: Diagnosis not present

## 2021-02-23 DIAGNOSIS — E1142 Type 2 diabetes mellitus with diabetic polyneuropathy: Secondary | ICD-10-CM | POA: Diagnosis not present

## 2021-02-23 DIAGNOSIS — R350 Frequency of micturition: Secondary | ICD-10-CM | POA: Diagnosis not present

## 2021-02-23 DIAGNOSIS — R531 Weakness: Secondary | ICD-10-CM | POA: Diagnosis not present

## 2021-02-23 DIAGNOSIS — E785 Hyperlipidemia, unspecified: Secondary | ICD-10-CM | POA: Diagnosis not present

## 2021-02-23 DIAGNOSIS — E1169 Type 2 diabetes mellitus with other specified complication: Secondary | ICD-10-CM | POA: Diagnosis not present

## 2021-02-23 DIAGNOSIS — Z794 Long term (current) use of insulin: Secondary | ICD-10-CM | POA: Diagnosis not present

## 2021-02-23 MED ORDER — TRIAMTERENE-HCTZ 37.5-25 MG PO TABS: 1.0000 | ORAL_TABLET | Freq: Every day | ORAL | 1 refills | Status: DC

## 2021-02-23 NOTE — Telephone Encounter (Signed)
High risk or very high risk warning populated when attempting to refill medication. RX request sent to PCP for review and approval if warranted.   

## 2021-02-24 ENCOUNTER — Encounter: Payer: Self-pay | Admitting: Registered Nurse

## 2021-02-24 DIAGNOSIS — R2681 Unsteadiness on feet: Secondary | ICD-10-CM | POA: Diagnosis not present

## 2021-02-24 DIAGNOSIS — E039 Hypothyroidism, unspecified: Secondary | ICD-10-CM | POA: Diagnosis not present

## 2021-02-24 DIAGNOSIS — E785 Hyperlipidemia, unspecified: Secondary | ICD-10-CM | POA: Diagnosis not present

## 2021-02-24 DIAGNOSIS — E1169 Type 2 diabetes mellitus with other specified complication: Secondary | ICD-10-CM | POA: Diagnosis not present

## 2021-02-24 DIAGNOSIS — R531 Weakness: Secondary | ICD-10-CM | POA: Diagnosis not present

## 2021-02-24 DIAGNOSIS — E1142 Type 2 diabetes mellitus with diabetic polyneuropathy: Secondary | ICD-10-CM | POA: Diagnosis not present

## 2021-02-24 DIAGNOSIS — R350 Frequency of micturition: Secondary | ICD-10-CM | POA: Diagnosis not present

## 2021-02-24 DIAGNOSIS — Z794 Long term (current) use of insulin: Secondary | ICD-10-CM | POA: Diagnosis not present

## 2021-02-25 ENCOUNTER — Telehealth: Payer: Self-pay | Admitting: *Deleted

## 2021-02-25 ENCOUNTER — Ambulatory Visit: Payer: Medicare Other | Admitting: Registered Nurse

## 2021-02-25 NOTE — Telephone Encounter (Signed)
Will with [facility] called requesting verbal orders for OT 2x3wks.  Verbal orders given.

## 2021-02-26 DIAGNOSIS — R531 Weakness: Secondary | ICD-10-CM | POA: Diagnosis not present

## 2021-02-26 DIAGNOSIS — R350 Frequency of micturition: Secondary | ICD-10-CM | POA: Diagnosis not present

## 2021-02-26 DIAGNOSIS — R2681 Unsteadiness on feet: Secondary | ICD-10-CM | POA: Diagnosis not present

## 2021-02-26 DIAGNOSIS — M4801 Spinal stenosis, occipito-atlanto-axial region: Secondary | ICD-10-CM | POA: Diagnosis not present

## 2021-02-26 DIAGNOSIS — E1169 Type 2 diabetes mellitus with other specified complication: Secondary | ICD-10-CM | POA: Diagnosis not present

## 2021-02-26 DIAGNOSIS — E785 Hyperlipidemia, unspecified: Secondary | ICD-10-CM | POA: Diagnosis not present

## 2021-02-26 DIAGNOSIS — E039 Hypothyroidism, unspecified: Secondary | ICD-10-CM | POA: Diagnosis not present

## 2021-02-26 DIAGNOSIS — E1142 Type 2 diabetes mellitus with diabetic polyneuropathy: Secondary | ICD-10-CM | POA: Diagnosis not present

## 2021-02-26 DIAGNOSIS — Z794 Long term (current) use of insulin: Secondary | ICD-10-CM | POA: Diagnosis not present

## 2021-03-02 ENCOUNTER — Telehealth: Payer: Self-pay | Admitting: *Deleted

## 2021-03-02 DIAGNOSIS — Z794 Long term (current) use of insulin: Secondary | ICD-10-CM | POA: Diagnosis not present

## 2021-03-02 DIAGNOSIS — E039 Hypothyroidism, unspecified: Secondary | ICD-10-CM | POA: Diagnosis not present

## 2021-03-02 DIAGNOSIS — E1169 Type 2 diabetes mellitus with other specified complication: Secondary | ICD-10-CM | POA: Diagnosis not present

## 2021-03-02 DIAGNOSIS — R350 Frequency of micturition: Secondary | ICD-10-CM | POA: Diagnosis not present

## 2021-03-02 DIAGNOSIS — R2681 Unsteadiness on feet: Secondary | ICD-10-CM | POA: Diagnosis not present

## 2021-03-02 DIAGNOSIS — E785 Hyperlipidemia, unspecified: Secondary | ICD-10-CM | POA: Diagnosis not present

## 2021-03-02 DIAGNOSIS — R531 Weakness: Secondary | ICD-10-CM | POA: Diagnosis not present

## 2021-03-02 DIAGNOSIS — E1142 Type 2 diabetes mellitus with diabetic polyneuropathy: Secondary | ICD-10-CM | POA: Diagnosis not present

## 2021-03-02 DIAGNOSIS — B372 Candidiasis of skin and nail: Secondary | ICD-10-CM

## 2021-03-02 MED ORDER — NYSTATIN 100000 UNIT/GM EX CREA: 1.0000 "application " | TOPICAL_CREAM | Freq: Three times a day (TID) | CUTANEOUS | 3 refills | Status: DC

## 2021-03-02 NOTE — Telephone Encounter (Signed)
Nystatin cream 166,063 units one application three times daily to affected areas on groin and breast folds.

## 2021-03-02 NOTE — Telephone Encounter (Signed)
Patient called and stated that she has yeast under her breast and in groin area. Stated that she has had this before and requesting something to be called in for it.  No other symptoms noted.  Patient stated that she does not want to come into office.   Please Advise.

## 2021-03-02 NOTE — Telephone Encounter (Signed)
Patient notified and agreed.  

## 2021-03-03 DIAGNOSIS — E039 Hypothyroidism, unspecified: Secondary | ICD-10-CM | POA: Diagnosis not present

## 2021-03-03 DIAGNOSIS — E785 Hyperlipidemia, unspecified: Secondary | ICD-10-CM | POA: Diagnosis not present

## 2021-03-03 DIAGNOSIS — E1142 Type 2 diabetes mellitus with diabetic polyneuropathy: Secondary | ICD-10-CM | POA: Diagnosis not present

## 2021-03-03 DIAGNOSIS — R531 Weakness: Secondary | ICD-10-CM | POA: Diagnosis not present

## 2021-03-03 DIAGNOSIS — E1169 Type 2 diabetes mellitus with other specified complication: Secondary | ICD-10-CM | POA: Diagnosis not present

## 2021-03-03 DIAGNOSIS — Z794 Long term (current) use of insulin: Secondary | ICD-10-CM | POA: Diagnosis not present

## 2021-03-03 DIAGNOSIS — R2681 Unsteadiness on feet: Secondary | ICD-10-CM | POA: Diagnosis not present

## 2021-03-03 DIAGNOSIS — R350 Frequency of micturition: Secondary | ICD-10-CM | POA: Diagnosis not present

## 2021-03-04 DIAGNOSIS — E1169 Type 2 diabetes mellitus with other specified complication: Secondary | ICD-10-CM | POA: Diagnosis not present

## 2021-03-04 DIAGNOSIS — R531 Weakness: Secondary | ICD-10-CM | POA: Diagnosis not present

## 2021-03-04 DIAGNOSIS — Z794 Long term (current) use of insulin: Secondary | ICD-10-CM | POA: Diagnosis not present

## 2021-03-04 DIAGNOSIS — E039 Hypothyroidism, unspecified: Secondary | ICD-10-CM | POA: Diagnosis not present

## 2021-03-04 DIAGNOSIS — R2681 Unsteadiness on feet: Secondary | ICD-10-CM | POA: Diagnosis not present

## 2021-03-04 DIAGNOSIS — E1142 Type 2 diabetes mellitus with diabetic polyneuropathy: Secondary | ICD-10-CM | POA: Diagnosis not present

## 2021-03-04 DIAGNOSIS — R350 Frequency of micturition: Secondary | ICD-10-CM | POA: Diagnosis not present

## 2021-03-04 DIAGNOSIS — E785 Hyperlipidemia, unspecified: Secondary | ICD-10-CM | POA: Diagnosis not present

## 2021-03-05 DIAGNOSIS — E785 Hyperlipidemia, unspecified: Secondary | ICD-10-CM | POA: Diagnosis not present

## 2021-03-05 DIAGNOSIS — E1142 Type 2 diabetes mellitus with diabetic polyneuropathy: Secondary | ICD-10-CM | POA: Diagnosis not present

## 2021-03-05 DIAGNOSIS — R350 Frequency of micturition: Secondary | ICD-10-CM | POA: Diagnosis not present

## 2021-03-05 DIAGNOSIS — E039 Hypothyroidism, unspecified: Secondary | ICD-10-CM | POA: Diagnosis not present

## 2021-03-05 DIAGNOSIS — E1169 Type 2 diabetes mellitus with other specified complication: Secondary | ICD-10-CM | POA: Diagnosis not present

## 2021-03-05 DIAGNOSIS — R2681 Unsteadiness on feet: Secondary | ICD-10-CM | POA: Diagnosis not present

## 2021-03-05 DIAGNOSIS — Z794 Long term (current) use of insulin: Secondary | ICD-10-CM | POA: Diagnosis not present

## 2021-03-05 DIAGNOSIS — R531 Weakness: Secondary | ICD-10-CM | POA: Diagnosis not present

## 2021-03-08 ENCOUNTER — Other Ambulatory Visit: Payer: Self-pay | Admitting: *Deleted

## 2021-03-08 ENCOUNTER — Telehealth: Payer: Self-pay | Admitting: *Deleted

## 2021-03-08 DIAGNOSIS — R2681 Unsteadiness on feet: Secondary | ICD-10-CM | POA: Diagnosis not present

## 2021-03-08 DIAGNOSIS — E785 Hyperlipidemia, unspecified: Secondary | ICD-10-CM | POA: Diagnosis not present

## 2021-03-08 DIAGNOSIS — Z794 Long term (current) use of insulin: Secondary | ICD-10-CM | POA: Diagnosis not present

## 2021-03-08 DIAGNOSIS — E039 Hypothyroidism, unspecified: Secondary | ICD-10-CM | POA: Diagnosis not present

## 2021-03-08 DIAGNOSIS — E1169 Type 2 diabetes mellitus with other specified complication: Secondary | ICD-10-CM | POA: Diagnosis not present

## 2021-03-08 DIAGNOSIS — R531 Weakness: Secondary | ICD-10-CM | POA: Diagnosis not present

## 2021-03-08 DIAGNOSIS — R350 Frequency of micturition: Secondary | ICD-10-CM | POA: Diagnosis not present

## 2021-03-08 DIAGNOSIS — E1142 Type 2 diabetes mellitus with diabetic polyneuropathy: Secondary | ICD-10-CM | POA: Diagnosis not present

## 2021-03-08 MED ORDER — VICTOZA 18 MG/3ML ~~LOC~~ SOPN: PEN_INJECTOR | SUBCUTANEOUS | 5 refills | Status: DC

## 2021-03-08 MED ORDER — FLUCONAZOLE 150 MG PO TABS: 150.0000 mg | ORAL_TABLET | Freq: Once | ORAL | 0 refills | Status: AC

## 2021-03-08 NOTE — Telephone Encounter (Signed)
Medication sent to pharmacy. Patient notified.

## 2021-03-08 NOTE — Telephone Encounter (Signed)
Patient called and stated that the Nystatin Cream you called in for her last week for yeast under her breast and groin area is not working. No other symptoms noted.  Patient is wanting to know if there is something else she can try.  Please Advise.

## 2021-03-08 NOTE — Telephone Encounter (Signed)
Pharmacy requested refill

## 2021-03-08 NOTE — Telephone Encounter (Signed)
May take Diflucan 150 mg tablet one by mouth x 1 dose then repeat dose in one week.

## 2021-03-09 DIAGNOSIS — R350 Frequency of micturition: Secondary | ICD-10-CM | POA: Diagnosis not present

## 2021-03-09 DIAGNOSIS — Z794 Long term (current) use of insulin: Secondary | ICD-10-CM | POA: Diagnosis not present

## 2021-03-09 DIAGNOSIS — E1169 Type 2 diabetes mellitus with other specified complication: Secondary | ICD-10-CM | POA: Diagnosis not present

## 2021-03-09 DIAGNOSIS — E1142 Type 2 diabetes mellitus with diabetic polyneuropathy: Secondary | ICD-10-CM | POA: Diagnosis not present

## 2021-03-09 DIAGNOSIS — R531 Weakness: Secondary | ICD-10-CM | POA: Diagnosis not present

## 2021-03-09 DIAGNOSIS — E785 Hyperlipidemia, unspecified: Secondary | ICD-10-CM | POA: Diagnosis not present

## 2021-03-09 DIAGNOSIS — E039 Hypothyroidism, unspecified: Secondary | ICD-10-CM | POA: Diagnosis not present

## 2021-03-09 DIAGNOSIS — R2681 Unsteadiness on feet: Secondary | ICD-10-CM | POA: Diagnosis not present

## 2021-03-10 ENCOUNTER — Encounter: Payer: Medicare Other | Admitting: Registered Nurse

## 2021-03-11 ENCOUNTER — Other Ambulatory Visit: Payer: Self-pay

## 2021-03-11 ENCOUNTER — Encounter: Payer: Self-pay | Admitting: Registered Nurse

## 2021-03-11 ENCOUNTER — Encounter: Payer: Medicare Other | Attending: Physical Medicine and Rehabilitation | Admitting: Registered Nurse

## 2021-03-11 VITALS — BP 119/55 | HR 67 | Temp 98.3°F | Ht 66.0 in

## 2021-03-11 DIAGNOSIS — R350 Frequency of micturition: Secondary | ICD-10-CM | POA: Diagnosis not present

## 2021-03-11 DIAGNOSIS — M25511 Pain in right shoulder: Secondary | ICD-10-CM

## 2021-03-11 DIAGNOSIS — G894 Chronic pain syndrome: Secondary | ICD-10-CM | POA: Diagnosis not present

## 2021-03-11 DIAGNOSIS — Z79891 Long term (current) use of opiate analgesic: Secondary | ICD-10-CM | POA: Insufficient documentation

## 2021-03-11 DIAGNOSIS — E039 Hypothyroidism, unspecified: Secondary | ICD-10-CM | POA: Diagnosis not present

## 2021-03-11 DIAGNOSIS — M25512 Pain in left shoulder: Secondary | ICD-10-CM

## 2021-03-11 DIAGNOSIS — M7062 Trochanteric bursitis, left hip: Secondary | ICD-10-CM | POA: Diagnosis not present

## 2021-03-11 DIAGNOSIS — M5416 Radiculopathy, lumbar region: Secondary | ICD-10-CM | POA: Diagnosis not present

## 2021-03-11 DIAGNOSIS — E1169 Type 2 diabetes mellitus with other specified complication: Secondary | ICD-10-CM | POA: Diagnosis not present

## 2021-03-11 DIAGNOSIS — G8929 Other chronic pain: Secondary | ICD-10-CM | POA: Diagnosis not present

## 2021-03-11 DIAGNOSIS — Z5181 Encounter for therapeutic drug level monitoring: Secondary | ICD-10-CM | POA: Insufficient documentation

## 2021-03-11 DIAGNOSIS — E1142 Type 2 diabetes mellitus with diabetic polyneuropathy: Secondary | ICD-10-CM | POA: Diagnosis not present

## 2021-03-11 DIAGNOSIS — M7061 Trochanteric bursitis, right hip: Secondary | ICD-10-CM

## 2021-03-11 DIAGNOSIS — Z794 Long term (current) use of insulin: Secondary | ICD-10-CM | POA: Diagnosis not present

## 2021-03-11 DIAGNOSIS — R531 Weakness: Secondary | ICD-10-CM | POA: Diagnosis not present

## 2021-03-11 DIAGNOSIS — M961 Postlaminectomy syndrome, not elsewhere classified: Secondary | ICD-10-CM | POA: Diagnosis present

## 2021-03-11 DIAGNOSIS — R2681 Unsteadiness on feet: Secondary | ICD-10-CM | POA: Diagnosis not present

## 2021-03-11 DIAGNOSIS — M170 Bilateral primary osteoarthritis of knee: Secondary | ICD-10-CM

## 2021-03-11 DIAGNOSIS — E785 Hyperlipidemia, unspecified: Secondary | ICD-10-CM | POA: Diagnosis not present

## 2021-03-11 MED ORDER — OXYCODONE-ACETAMINOPHEN 10-325 MG PO TABS: 1.0000 | ORAL_TABLET | Freq: Four times a day (QID) | ORAL | 0 refills | Status: DC | PRN

## 2021-03-11 NOTE — Progress Notes (Deleted)
Office Visit Note  Patient: Katherine Delgado             Date of Birth: [birth year]           MRN: [id number]             PCP: Sandrea Hughs, NP Referring: Gayland Curry, DO Visit Date: 03/25/2021 Occupation: @GUAROCC @  Subjective:  No chief complaint on file.   History of Present Illness: Katherine Delgado is a 80 y.o. female ***   Activities of Daily Living:  Patient reports morning stiffness for *** {minute/hour:19697}.   Patient {ACTIONS;DENIES/REPORTS:21021675::"Denies"} nocturnal pain.  Difficulty dressing/grooming: {ACTIONS;DENIES/REPORTS:21021675::"Denies"} Difficulty climbing stairs: {ACTIONS;DENIES/REPORTS:21021675::"Denies"} Difficulty getting out of chair: {ACTIONS;DENIES/REPORTS:21021675::"Denies"} Difficulty using hands for taps, buttons, cutlery, and/or writing: {ACTIONS;DENIES/REPORTS:21021675::"Denies"}  No Rheumatology ROS completed.   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   Primary osteoarthritis of right knee 07/25/2019   Wound dehiscence 08/17/2018   Lumbar spinal stenosis 03/30/2018   Rheumatoid arthritis involving both wrists with positive rheumatoid factor ([place]) 06/14/2016   High risk medication use 06/14/2016   Sjogren's syndrome ([place]) 06/14/2016   Primary osteoarthritis of both knees 06/14/2016   DDD (degenerative disc disease), lumbar 06/14/2016   Hypothyroidism 06/14/2016   Osteoporosis 06/14/2016   Abnormality of gait 04/19/2016   Type 2 diabetes mellitus with diabetic polyneuropathy, with long-term current use of insulin ([place]) 11/27/2015   Diabetes mellitus without complication ([place]) 79/89/2119   Headache(784.0) 07/31/2013   Sinus infection 07/31/2013   Chronic right-sided low back pain with right-sided sciatica 03/14/2013   Insomnia 12/06/2012   Nausea alone 12/06/2012   Diarrhea 12/06/2012   Urinary incontinence, urge 12/06/2012   Lumbosacral root lesions,  not elsewhere classified 11/27/2012   Diabetic polyneuropathy associated with type 2 diabetes mellitus ([place]) 11/27/2012   EDEMA 05/04/2010   YEAST INFECTION 05/03/2010   Hyperlipidemia 05/03/2010   DEPRESSION 05/03/2010   History of peripheral neuropathy 05/03/2010   Essential hypertension 05/03/2010   ALLERGIC RHINITIS 05/03/2010   PNEUMONIA 05/03/2010   COPD (chronic obstructive pulmonary disease) ([place]) 05/03/2010   GERD 05/03/2010   Fibromyalgia 05/03/2010   DYSPNEA 05/03/2010   CHEST PAIN 05/03/2010    Past Medical History:  Diagnosis Date   Abnormality of gait 04/19/2016   Acute infective polyneuritis ([place]) 2002   Allergic rhinitis due to pollen    Chronic pain syndrome    COPD (chronic obstructive pulmonary disease) (HCC)    chronic bronchitis   Depressive disorder, not elsewhere classified    Diabetes mellitus without complication ([place])    Diaphragmatic hernia without mention of obstruction or gangrene    Dyslipidemia    Fibromyalgia    GERD (gastroesophageal reflux disease)    with h/o esophagitis   Guillain-Barre syndrome ([place])    History of benign thymus tumor    Insomnia, unspecified    Lumbar spinal stenosis 03/30/2018   L4-5 level, severe   Miscarriage 1962   Mixed hyperlipidemia    Morbid obesity ([place])    Osteoporosis    Pneumonia    Polyneuropathy in diabetes(357.2)    Rheumatoid arthritis(714.0)    Spondylosis, lumbosacral    Spontaneous ecchymoses    Type II or unspecified type diabetes mellitus with peripheral circulatory disorders, uncontrolled(250.72)    Unspecified chronic bronchitis (HCC)    Unspecified essential hypertension    Unspecified hypothyroidism    Unspecified pruritic disorder    Unspecified urinary incontinence  Family History  Problem Relation Age of Onset   Alzheimer's disease Mother    Heart disease Mother    Heart disease Father    Liver disease Father    Cancer Brother    Arthritis Son    Colon polyps Brother    Colon  cancer Neg Hx    Esophageal cancer Neg Hx    Kidney disease Neg Hx    Stomach cancer Neg Hx    Rectal cancer Neg Hx    Past Surgical History:  Procedure Laterality Date   ABDOMINAL HYSTERECTOMY  1974   abdominal tumor  2002   APPENDECTOMY     APPLICATION OF A-CELL OF BACK N/A 09/26/2018   Procedure: With Acell;  Surgeon: Wallace Going, DO;  Location: [place];  Service: Plastics;  Laterality: N/A;   APPLICATION OF WOUND VAC N/A 09/26/2018   Procedure: Vac placement;  Surgeon: Wallace Going, DO;  Location: [place];  Service: Plastics;  Laterality: N/A;   BACK SURGERY  1982   BRONCHOSCOPY  2001   CATARACT EXTRACTION, BILATERAL  09/2019, 10/2019   CHOLECYSTECTOMY  1984   INCISION AND DRAINAGE OF WOUND N/A 09/26/2018   Procedure: Debridement of spine wound;  Surgeon: Wallace Going, DO;  Location: [place];  Service: Plastics;  Laterality: N/A;   KNEE SURGERY Bilateral 08/27/2010 (L) and 01/11/2011 (R)   LUMBAR LAMINECTOMY/DECOMPRESSION MICRODISCECTOMY N/A 07/03/2018   Procedure: Lumbar Three to Lumbar Five Laminectomy;  Surgeon: Judith Part, MD;  Location: [place];  Service: Neurosurgery;  Laterality: N/A;   LUMBAR WOUND DEBRIDEMENT N/A 08/20/2018   Procedure: POSTERIOR LUMBAR SPINAL WOUND DEBRIDEMENT AND REVISION;  Surgeon: Judith Part, MD;  Location: [place];  Service: Neurosurgery;  Laterality: N/A;  POSTERIOR LUMBAR SPINAL WOUND REVISION   miscarrage  1962   OVARIAN CYST SURGERY  1968   thymus tumor     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   TONSILLECTOMY     Social History   Social History Narrative   Walks with cane   Right handed    Caffeine use: Coffee (2 cups every morning)   Tea: sometimes   Soda: none   Immunization History  Administered Date(s) Administered   Influenza-Unspecified 06/10/2011   PFIZER(Purple Top)SARS-COV-2 Vaccination 11/13/2019, 11/30/2019   Pneumococcal Conjugate-13 07/07/2016   Pneumococcal Polysaccharide-23 11/21/2003, 10/05/2017   Tdap  05/28/2016     Objective: Vital Signs: There were no vitals taken for this visit.   Physical Exam   Musculoskeletal Exam: ***  CDAI Exam: CDAI Score: -- Patient Global: --; Provider Global: -- Swollen: --; Tender: -- Joint Exam 03/25/2021   No joint exam has been documented for this visit   There is currently no information documented on the homunculus. Go to the Rheumatology activity and complete the homunculus joint exam.  Investigation: No additional findings.  Imaging: No results found.  Recent Labs: Lab Results  Component Value Date   WBC 16.2 (H) 02/12/2021   HGB 11.4 (L) 02/12/2021   PLT 412 (H) 02/12/2021   NA 138 02/12/2021   K 4.2 02/12/2021   CL 98 02/12/2021   CO2 29 02/12/2021   GLUCOSE 127 (H) 02/12/2021   BUN 14 02/12/2021   CREATININE 0.96 (H) 02/12/2021   BILITOT 0.4 02/12/2021   ALKPHOS 96 01/25/2021   AST 16 02/12/2021   ALT 12 02/12/2021   PROT 5.9 (L) 02/12/2021   ALBUMIN 3.5 01/25/2021   CALCIUM 8.9 02/12/2021   GFRAA 65 02/12/2021   QFTBGOLDPLUS  NEGATIVE 06/11/2020    Speciality Comments: No specialty comments available.  Procedures:  No procedures performed Allergies: Influenza vaccines, Penicillins, Statins, and Sulfamethoxazole-trimethoprim   Assessment / Plan:     Visit Diagnoses: No diagnosis found.  Orders: No orders of the defined types were placed in this encounter.  No orders of the defined types were placed in this encounter.   Face-to-face time spent with patient was *** minutes. Greater than 50% of time was spent in counseling and coordination of care.  Follow-Up Instructions: No follow-ups on file.   Earnestine Mealing, CMA  Note - This record has been created using Editor, commissioning.  Chart creation errors have been sought, but may not always  have been located. Such creation errors do not reflect on  the standard of medical care.

## 2021-03-11 NOTE — Progress Notes (Addendum)
Subjective:    Patient ID: Katherine Delgado, female    DOB: 03-Jan-1941, 80 y.o.   MRN: [id number]  HPI: Katherine Delgado is a 80 y.o. female who returns for follow up appointment for chronic pain and medication refill. She states her pain is located in her bilateral shoulders, lower back pain radiating into his left hip. Katherine Delgado requesting left hip injection, this will be scheduled with Dr Harley Alto she and her son verbalizes understanding. She rates her pain 7. Her current exercise regime is receiving PT/OT two days a week.  Katherine Delgado Morphine equivalent is 60.00  MME.   Oral Swab was Performed today.    Pain Inventory Average Pain 8 Pain Right Now 7 My pain is sharp, burning, dull, stabbing, tingling, and aching  In the last 24 hours, has pain interfered with the following? General activity 10 Relation with others 10 Enjoyment of life 10 What TIME of day is your pain at its worst? morning , evening, and night Sleep (in general) Poor  Pain is worse with: walking, bending, sitting, standing, and some activites Pain improves with: rest and medication Relief from Meds: 4  Family History  Problem Relation Age of Onset   Alzheimer's disease Mother    Heart disease Mother    Heart disease Father    Liver disease Father    Cancer Brother    Arthritis Son    Colon polyps Brother    Colon cancer Neg Hx    Esophageal cancer Neg Hx    Kidney disease Neg Hx    Stomach cancer Neg Hx    Rectal cancer Neg Hx    Social History   Socioeconomic History   Marital status: Widowed    Spouse name: Not on file   Number of children: 2   Years of education: 14   Highest education level: Not on file  Occupational History   Occupation: Retired  Tobacco Use   Smoking status: Former    Packs/day: 0.10    Years: 10.00    Pack years: 1.00    Types: Cigarettes    Quit date: 12/07/1979    Years since quitting: 41.2   Smokeless tobacco: Never  Vaping Use   Vaping Use: Never used  Substance  and Sexual Activity   Alcohol use: No    Alcohol/week: 0.0 standard drinks   Drug use: No   Sexual activity: Not Currently  Other Topics Concern   Not on file  Social History Narrative   Walks with cane   Right handed    Caffeine use: Coffee (2 cups every morning)   Tea: sometimes   Soda: none   Social Determinants of Health   Financial Resource Strain: Not on file  Food Insecurity: Not on file  Transportation Needs: Not on file  Physical Activity: Not on file  Stress: Not on file  Social Connections: Not on file   Past Surgical History:  Procedure Laterality Date   ABDOMINAL HYSTERECTOMY  1974   abdominal tumor  2002   APPENDECTOMY     APPLICATION OF A-CELL OF BACK N/A 09/26/2018   Procedure: With Acell;  Surgeon: Wallace Going, DO;  Location: [place];  Service: Plastics;  Laterality: N/A;   APPLICATION OF WOUND VAC N/A 09/26/2018   Procedure: Vac placement;  Surgeon: Wallace Going, DO;  Location: [place];  Service: Plastics;  Laterality: N/A;   BACK SURGERY  1982   BRONCHOSCOPY  2001   CATARACT EXTRACTION, BILATERAL  09/2019, 10/2019   CHOLECYSTECTOMY  1984   INCISION AND DRAINAGE OF WOUND N/A 09/26/2018   Procedure: Debridement of spine wound;  Surgeon: Wallace Going, DO;  Location: [place];  Service: Plastics;  Laterality: N/A;   KNEE SURGERY Bilateral 08/27/2010 (L) and 01/11/2011 (R)   LUMBAR LAMINECTOMY/DECOMPRESSION MICRODISCECTOMY N/A 07/03/2018   Procedure: Lumbar Three to Lumbar Five Laminectomy;  Surgeon: Judith Part, MD;  Location: [place];  Service: Neurosurgery;  Laterality: N/A;   LUMBAR WOUND DEBRIDEMENT N/A 08/20/2018   Procedure: POSTERIOR LUMBAR SPINAL WOUND DEBRIDEMENT AND REVISION;  Surgeon: Judith Part, MD;  Location: [place];  Service: Neurosurgery;  Laterality: N/A;  POSTERIOR LUMBAR SPINAL WOUND REVISION   miscarrage  1962   OVARIAN CYST SURGERY  1968   thymus tumor     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   TONSILLECTOMY     Past  Surgical History:  Procedure Laterality Date   ABDOMINAL HYSTERECTOMY  1974   abdominal tumor  2002   APPENDECTOMY     APPLICATION OF A-CELL OF BACK N/A 09/26/2018   Procedure: With Acell;  Surgeon: Wallace Going, DO;  Location: [place];  Service: Plastics;  Laterality: N/A;   APPLICATION OF WOUND VAC N/A 09/26/2018   Procedure: Vac placement;  Surgeon: Wallace Going, DO;  Location: [place];  Service: Plastics;  Laterality: N/A;   BACK SURGERY  1982   BRONCHOSCOPY  2001   CATARACT EXTRACTION, BILATERAL  09/2019, 10/2019   CHOLECYSTECTOMY  1984   INCISION AND DRAINAGE OF WOUND N/A 09/26/2018   Procedure: Debridement of spine wound;  Surgeon: Wallace Going, DO;  Location: [place];  Service: Plastics;  Laterality: N/A;   KNEE SURGERY Bilateral 08/27/2010 (L) and 01/11/2011 (R)   LUMBAR LAMINECTOMY/DECOMPRESSION MICRODISCECTOMY N/A 07/03/2018   Procedure: Lumbar Three to Lumbar Five Laminectomy;  Surgeon: Judith Part, MD;  Location: [place];  Service: Neurosurgery;  Laterality: N/A;   LUMBAR WOUND DEBRIDEMENT N/A 08/20/2018   Procedure: POSTERIOR LUMBAR SPINAL WOUND DEBRIDEMENT AND REVISION;  Surgeon: Judith Part, MD;  Location: [place];  Service: Neurosurgery;  Laterality: N/A;  POSTERIOR LUMBAR SPINAL WOUND REVISION   miscarrage  1962   OVARIAN CYST SURGERY  1968   thymus tumor     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   TONSILLECTOMY     Past Medical History:  Diagnosis Date   Abnormality of gait 04/19/2016   Acute infective polyneuritis ([place]) 2002   Allergic rhinitis due to pollen    Chronic pain syndrome    COPD (chronic obstructive pulmonary disease) (HCC)    chronic bronchitis   Depressive disorder, not elsewhere classified    Diabetes mellitus without complication ([place])    Diaphragmatic hernia without mention of obstruction or gangrene    Dyslipidemia    Fibromyalgia    GERD (gastroesophageal reflux disease)    with h/o esophagitis   Guillain-Barre syndrome (HCC)     History of benign thymus tumor    Insomnia, unspecified    Lumbar spinal stenosis 03/30/2018   L4-5 level, severe   Miscarriage 1962   Mixed hyperlipidemia    Morbid obesity ([place])    Osteoporosis    Pneumonia    Polyneuropathy in diabetes(357.2)    Rheumatoid arthritis(714.0)    Spondylosis, lumbosacral    Spontaneous ecchymoses    Type II or unspecified type diabetes mellitus with peripheral circulatory disorders, uncontrolled(250.72)    Unspecified chronic bronchitis (HCC)    Unspecified essential hypertension  Unspecified hypothyroidism    Unspecified pruritic disorder    Unspecified urinary incontinence    BP (!) 119/55   Pulse 67   Temp 98.3 F (36.8 C) (Oral)   Ht 5\' 6"  (1.676 m)   SpO2 93%   BMI 37.54 kg/m   Opioid Risk Score:   Fall Risk Score:  `1  Depression screen PHQ 2/9  Depression screen [facility] 2/9 02/10/2021 01/19/2021 10/15/2020 10/01/2020 07/23/2020 06/19/2020 06/15/2020  Decreased Interest 0 0 0 0 0 0 0  Down, Depressed, Hopeless 0 0 0 0 0 0 0  PHQ - 2 Score 0 0 0 0 0 0 0  Altered sleeping - - - - - - -  Tired, decreased energy - - - - - - -  Change in appetite - - - - - - -  Feeling bad or failure about yourself  - - - - - - -  Trouble concentrating - - - - - - -  Moving slowly or fidgety/restless - - - - - - -  Suicidal thoughts - - - - - - -  PHQ-9 Score - - - - - - -  Difficult doing work/chores - - - - - - -  Some recent data might be hidden       Review of Systems  Constitutional: Negative.   HENT: Negative.    Eyes: Negative.   Respiratory: Negative.    Cardiovascular: Negative.   Gastrointestinal: Negative.   Endocrine: Negative.   Genitourinary: Negative.   Musculoskeletal:  Positive for back pain, gait problem and neck pain.       Pain in noth legs, pain  in right shoulder , pain in both knees , pain in both feet  Skin: Negative.   Allergic/Immunologic: Negative.   Hematological: Negative.   Psychiatric/Behavioral: Negative.         Objective:   Physical Exam Vitals and nursing note reviewed.  Constitutional:      Appearance: Normal appearance.  Cardiovascular:     Rate and Rhythm: Normal rate and regular rhythm.     Pulses: Normal pulses.     Heart sounds: Normal heart sounds.  Pulmonary:     Effort: Pulmonary effort is normal.     Breath sounds: Normal breath sounds.  Musculoskeletal:     Cervical back: Normal range of motion and neck supple.     Comments: Normal Muscle Bulk and Muscle Testing Reveals:  Upper Extremities: Full ROM and Muscle Strength 5/5 Bilateral AC Joint Tenderness Lumbar Paraspinal Tenderness: L-3-L-5 Left Greater Trochanter Tenderness Lower Extremities: Full ROM and Muscle Strength 5/5 Arrived in wheelchair     Skin:    General: Skin is warm and dry.  Neurological:     Mental Status: She is alert and oriented to person, place, and time.  Psychiatric:        Mood and Affect: Mood normal.        Behavior: Behavior normal.         Assessment & Plan:  Failed Back Syndrome: Continue HEP as Tolerated. Continue to Monitor. She is receiving Home Health therapy at this time, PT/OT two days a week. 03/11/2021 Lumbar Radiculitis: Continue Gabapentin. Continue to monitor. Continue HEP as Tolerated.03/11/2021 Left  Greater Trochanter Bursitis: Scheduled for left hip injection with Dr Ranell Patrick.Continue to Alternate Ice and Heat Therapy. Continue current medication regimen. Continue to monitor. 03/11/2021 Bilateral Primary OA: Continue HEP as Tolerated. Continue to Monitor. 03/11/2021 Insomnia: Continue Amitriptyline . Continue to Monitor.03/11/2021 Chronic Pain  Syndrome: Refilled: Oxycodone 10/325 mg one tablet every 6 hours as needed for pain #120. We will continue the opioid monitoring program, this consists of regular clinic visits, examinations, urine drug screen, pill counts as well as use of [place] Controlled Substance Reporting system. A 12 month History has been reviewed on the  [place] Controlled Substance Reporting System on 03/11/2021.   F/U in 1 month

## 2021-03-15 DIAGNOSIS — R350 Frequency of micturition: Secondary | ICD-10-CM | POA: Diagnosis not present

## 2021-03-15 DIAGNOSIS — Z794 Long term (current) use of insulin: Secondary | ICD-10-CM | POA: Diagnosis not present

## 2021-03-15 DIAGNOSIS — E1169 Type 2 diabetes mellitus with other specified complication: Secondary | ICD-10-CM | POA: Diagnosis not present

## 2021-03-15 DIAGNOSIS — E785 Hyperlipidemia, unspecified: Secondary | ICD-10-CM | POA: Diagnosis not present

## 2021-03-15 DIAGNOSIS — R531 Weakness: Secondary | ICD-10-CM | POA: Diagnosis not present

## 2021-03-15 DIAGNOSIS — E039 Hypothyroidism, unspecified: Secondary | ICD-10-CM | POA: Diagnosis not present

## 2021-03-15 DIAGNOSIS — E1142 Type 2 diabetes mellitus with diabetic polyneuropathy: Secondary | ICD-10-CM | POA: Diagnosis not present

## 2021-03-15 DIAGNOSIS — R2681 Unsteadiness on feet: Secondary | ICD-10-CM | POA: Diagnosis not present

## 2021-03-19 LAB — DRUG TOX MONITOR 1 W/CONF, ORAL FLD
Amphetamines: NEGATIVE ng/mL (ref <10)
Barbiturates: NEGATIVE ng/mL (ref <10)
Benzodiazepines: NEGATIVE ng/mL (ref <0.50)
Buprenorphine: NEGATIVE ng/mL (ref <0.10)
Cocaine: NEGATIVE ng/mL (ref <5.0)
Codeine: NEGATIVE ng/mL (ref <2.5)
Dihydrocodeine: NEGATIVE ng/mL (ref <2.5)
Fentanyl: NEGATIVE ng/mL (ref <0.10)
Heroin Metabolite: NEGATIVE ng/mL (ref <1.0)
Hydrocodone: NEGATIVE ng/mL (ref <2.5)
Hydromorphone: NEGATIVE ng/mL (ref <2.5)
MARIJUANA: NEGATIVE ng/mL (ref <2.5)
MDMA: NEGATIVE ng/mL (ref <10)
Meprobamate: NEGATIVE ng/mL (ref <2.5)
Methadone: NEGATIVE ng/mL (ref <5.0)
Morphine: NEGATIVE ng/mL (ref <2.5)
Nicotine Metabolite: NEGATIVE ng/mL (ref <5.0)
Norhydrocodone: NEGATIVE ng/mL (ref <2.5)
Noroxycodone: 2.9 ng/mL — ABNORMAL HIGH (ref <2.5)
Opiates: POSITIVE ng/mL — AB (ref <2.5)
Oxycodone: 21.2 ng/mL — ABNORMAL HIGH (ref <2.5)
Oxymorphone: NEGATIVE ng/mL (ref <2.5)
Phencyclidine: NEGATIVE ng/mL (ref <10)
Tapentadol: NEGATIVE ng/mL (ref <5.0)
Tramadol: NEGATIVE ng/mL (ref <5.0)
Zolpidem: NEGATIVE ng/mL (ref <5.0)

## 2021-03-19 LAB — DRUG TOX ALC METAB W/CON, ORAL FLD: Alcohol Metabolite: NEGATIVE ng/mL (ref <25)

## 2021-03-23 ENCOUNTER — Telehealth: Payer: Self-pay | Admitting: *Deleted

## 2021-03-23 NOTE — Telephone Encounter (Signed)
Oral swab drug screen was consistent for prescribed medications.  ?

## 2021-03-24 DIAGNOSIS — Z794 Long term (current) use of insulin: Secondary | ICD-10-CM | POA: Diagnosis not present

## 2021-03-24 DIAGNOSIS — E1142 Type 2 diabetes mellitus with diabetic polyneuropathy: Secondary | ICD-10-CM | POA: Diagnosis not present

## 2021-03-25 ENCOUNTER — Ambulatory Visit: Payer: Medicare Other | Admitting: Rheumatology

## 2021-03-25 DIAGNOSIS — Z8669 Personal history of other diseases of the nervous system and sense organs: Secondary | ICD-10-CM

## 2021-03-25 DIAGNOSIS — H2513 Age-related nuclear cataract, bilateral: Secondary | ICD-10-CM | POA: Diagnosis not present

## 2021-03-25 DIAGNOSIS — M797 Fibromyalgia: Secondary | ICD-10-CM

## 2021-03-25 DIAGNOSIS — M5136 Other intervertebral disc degeneration, lumbar region: Secondary | ICD-10-CM

## 2021-03-25 DIAGNOSIS — M0579 Rheumatoid arthritis with rheumatoid factor of multiple sites without organ or systems involvement: Secondary | ICD-10-CM

## 2021-03-25 DIAGNOSIS — Z8719 Personal history of other diseases of the digestive system: Secondary | ICD-10-CM

## 2021-03-25 DIAGNOSIS — G4709 Other insomnia: Secondary | ICD-10-CM

## 2021-03-25 DIAGNOSIS — Z8659 Personal history of other mental and behavioral disorders: Secondary | ICD-10-CM

## 2021-03-25 DIAGNOSIS — M810 Age-related osteoporosis without current pathological fracture: Secondary | ICD-10-CM

## 2021-03-25 DIAGNOSIS — M170 Bilateral primary osteoarthritis of knee: Secondary | ICD-10-CM

## 2021-03-25 DIAGNOSIS — Z8639 Personal history of other endocrine, nutritional and metabolic disease: Secondary | ICD-10-CM

## 2021-03-25 DIAGNOSIS — Z8679 Personal history of other diseases of the circulatory system: Secondary | ICD-10-CM

## 2021-03-25 DIAGNOSIS — Z79899 Other long term (current) drug therapy: Secondary | ICD-10-CM

## 2021-03-25 DIAGNOSIS — M3501 Sicca syndrome with keratoconjunctivitis: Secondary | ICD-10-CM

## 2021-03-25 DIAGNOSIS — Z8709 Personal history of other diseases of the respiratory system: Secondary | ICD-10-CM

## 2021-03-25 LAB — HM DIABETES EYE EXAM

## 2021-03-29 ENCOUNTER — Other Ambulatory Visit: Payer: Self-pay | Admitting: Family

## 2021-03-29 ENCOUNTER — Other Ambulatory Visit: Payer: Self-pay | Admitting: Nurse Practitioner

## 2021-03-29 DIAGNOSIS — R531 Weakness: Secondary | ICD-10-CM | POA: Diagnosis not present

## 2021-03-29 DIAGNOSIS — M4801 Spinal stenosis, occipito-atlanto-axial region: Secondary | ICD-10-CM | POA: Diagnosis not present

## 2021-03-31 ENCOUNTER — Other Ambulatory Visit: Payer: Self-pay

## 2021-03-31 ENCOUNTER — Encounter (HOSPITAL_COMMUNITY): Payer: Self-pay

## 2021-03-31 ENCOUNTER — Emergency Department (HOSPITAL_COMMUNITY)
Admission: EM | Admit: 2021-03-31 | Discharge: 2021-03-31 | Disposition: A | Discharge status: Home / Self Care | Payer: Medicare Other | Attending: Emergency Medicine | Admitting: Emergency Medicine

## 2021-03-31 ENCOUNTER — Emergency Department (HOSPITAL_COMMUNITY): Payer: Medicare Other

## 2021-03-31 DIAGNOSIS — E1142 Type 2 diabetes mellitus with diabetic polyneuropathy: Secondary | ICD-10-CM | POA: Insufficient documentation

## 2021-03-31 DIAGNOSIS — B029 Zoster without complications: Secondary | ICD-10-CM | POA: Insufficient documentation

## 2021-03-31 DIAGNOSIS — Z87891 Personal history of nicotine dependence: Secondary | ICD-10-CM | POA: Diagnosis not present

## 2021-03-31 DIAGNOSIS — R600 Localized edema: Secondary | ICD-10-CM | POA: Insufficient documentation

## 2021-03-31 DIAGNOSIS — J449 Chronic obstructive pulmonary disease, unspecified: Secondary | ICD-10-CM | POA: Diagnosis not present

## 2021-03-31 DIAGNOSIS — I5030 Unspecified diastolic (congestive) heart failure: Secondary | ICD-10-CM | POA: Diagnosis not present

## 2021-03-31 DIAGNOSIS — Z20822 Contact with and (suspected) exposure to covid-19: Secondary | ICD-10-CM | POA: Diagnosis not present

## 2021-03-31 DIAGNOSIS — E039 Hypothyroidism, unspecified: Secondary | ICD-10-CM | POA: Diagnosis not present

## 2021-03-31 DIAGNOSIS — Z79899 Other long term (current) drug therapy: Secondary | ICD-10-CM | POA: Diagnosis not present

## 2021-03-31 DIAGNOSIS — I110 Hypertensive heart disease with heart failure: Secondary | ICD-10-CM | POA: Insufficient documentation

## 2021-03-31 DIAGNOSIS — R079 Chest pain, unspecified: Secondary | ICD-10-CM | POA: Insufficient documentation

## 2021-03-31 DIAGNOSIS — Z7982 Long term (current) use of aspirin: Secondary | ICD-10-CM | POA: Diagnosis not present

## 2021-03-31 DIAGNOSIS — Z794 Long term (current) use of insulin: Secondary | ICD-10-CM | POA: Diagnosis not present

## 2021-03-31 DIAGNOSIS — R0602 Shortness of breath: Secondary | ICD-10-CM | POA: Diagnosis not present

## 2021-03-31 DIAGNOSIS — R0789 Other chest pain: Secondary | ICD-10-CM | POA: Diagnosis not present

## 2021-03-31 LAB — RESP PANEL BY RT-PCR (FLU A&B, COVID) ARPGX2
Influenza A by PCR: NEGATIVE
Influenza B by PCR: NEGATIVE
SARS Coronavirus 2 by RT PCR: NEGATIVE

## 2021-03-31 LAB — BASIC METABOLIC PANEL
Anion gap: 12 (ref 5–15)
BUN: 15 mg/dL (ref 8–23)
CO2: 24 mmol/L (ref 22–32)
Calcium: 9.5 mg/dL (ref 8.9–10.3)
Chloride: 100 mmol/L (ref 98–111)
Creatinine, Ser: 1.13 mg/dL — ABNORMAL HIGH (ref 0.44–1.00)
GFR, Estimated: 49 mL/min — ABNORMAL LOW (ref >60)
Glucose, Bld: 87 mg/dL (ref 70–99)
Potassium: 3.5 mmol/L (ref 3.5–5.1)
Sodium: 136 mmol/L (ref 135–145)

## 2021-03-31 LAB — CBG MONITORING, ED
Glucose-Capillary: 46 mg/dL — ABNORMAL LOW (ref 70–99)
Glucose-Capillary: 59 mg/dL — ABNORMAL LOW (ref 70–99)
Glucose-Capillary: 81 mg/dL (ref 70–99)
Glucose-Capillary: 91 mg/dL (ref 70–99)
Glucose-Capillary: 148 mg/dL — ABNORMAL HIGH (ref 70–99)

## 2021-03-31 LAB — CBC WITH DIFFERENTIAL/PLATELET
Abs Immature Granulocytes: 0.06 10*3/uL (ref 0.00–0.07)
Basophils Absolute: 0.1 10*3/uL (ref 0.0–0.1)
Basophils Relative: 1 %
Eosinophils Absolute: 0.2 10*3/uL (ref 0.0–0.5)
Eosinophils Relative: 2 %
HCT: 44.3 % (ref 36.0–46.0)
Hemoglobin: 14.2 g/dL (ref 12.0–15.0)
Immature Granulocytes: 1 %
Lymphocytes Relative: 7 %
Lymphs Abs: 0.7 10*3/uL (ref 0.7–4.0)
MCH: 27.1 pg (ref 26.0–34.0)
MCHC: 32.1 g/dL (ref 30.0–36.0)
MCV: 84.5 fL (ref 80.0–100.0)
Monocytes Absolute: 0.7 10*3/uL (ref 0.1–1.0)
Monocytes Relative: 7 %
Neutro Abs: 8.8 10*3/uL — ABNORMAL HIGH (ref 1.7–7.7)
Neutrophils Relative %: 82 %
Platelets: 258 10*3/uL (ref 150–400)
RBC: 5.24 MIL/uL — ABNORMAL HIGH (ref 3.87–5.11)
RDW: 13.2 % (ref 11.5–15.5)
WBC: 10.5 10*3/uL (ref 4.0–10.5)
nRBC: 0 % (ref 0.0–0.2)

## 2021-03-31 LAB — TROPONIN I (HIGH SENSITIVITY)
Troponin I (High Sensitivity): 6 ng/L (ref <18)
Troponin I (High Sensitivity): 5 ng/L (ref <18)

## 2021-03-31 LAB — D-DIMER, QUANTITATIVE: D-Dimer, Quant: 1.4 ug/mL-FEU — ABNORMAL HIGH (ref 0.00–0.50)

## 2021-03-31 MED ORDER — IOHEXOL 350 MG/ML SOLN
100.0000 mL | Freq: Once | INTRAVENOUS | Status: AC | PRN
  Administered 2021-03-31: 80 mL via INTRAVENOUS

## 2021-03-31 MED ORDER — VALACYCLOVIR HCL 1 G PO TABS: 1000.0000 mg | ORAL_TABLET | Freq: Three times a day (TID) | ORAL | 0 refills | Status: DC

## 2021-03-31 MED ORDER — VALACYCLOVIR HCL 500 MG PO TABS
1000.0000 mg | ORAL_TABLET | Freq: Three times a day (TID) | ORAL | Status: DC
  Administered 2021-03-31: 1000 mg via ORAL
  Filled 2021-03-31: qty 2

## 2021-03-31 NOTE — ED Notes (Addendum)
Pt

## 2021-03-31 NOTE — ED Notes (Signed)
This nurse was notified by ED tech that pt's BG was 46. ED provide notified and pt given 2, 8oz orange juices. Will re-assess. Pt A&O x4. VSS at this time.

## 2021-03-31 NOTE — ED Notes (Signed)
ED provider Dr. Langston Masker notified of pt's BG of 81 at this time. Pt continues to eat graham crackers, apple sauce and drink orange juice. No additional orders at this time. Per provider's verbal order, maintaining pt's BG above 60. Will continue to re-assess.

## 2021-03-31 NOTE — ED Provider Notes (Signed)
[facility] DEPT Provider Note   CSN: [id number] Arrival date & time: 03/31/21  0749     History Chief Complaint  Patient presents with   Chest Pain    Katherine Delgado is a 80 y.o. female with a history of obesity, hypertension, hyperlipidemia, COPD (not a smoker, not on oxygen), mild diastolic congestive heart failure, presenting to the emergency department with chest pain.  The patient ports gradual onset of right-sided chest pain on Friday, 4 days ago.  She does not recall any inciting events.  She says the pain has been persistent since Friday.  It is worse with inspiration.  She states "it feels like he cannot take in a deep breath".  The pain seems to radiate towards her right shoulder.  She reports she has not had this kind of pain before.  It is currently mild to moderate intensity.  It is not exertional or positional.  She denies fevers, chills, congestion, sore throat, nausea, diarrhea, lightheadedness.  She does report a mild persistent cough, which she says not unusual for her.  She separately ports that she noted a vesicular rash that appeared on her right arm over the weekend.  It feels "raw and painful".  She has not had a rash like this before.  She denies any history of MI or cardiac stents.  She was last seen by a cardiologist in 2018.  She reports that she periodically takes Lasix as needed for leg swelling, and does have a little bit of edema in her lower legs.  The right leg is chronically more swollen than the left.  She denies any history of DVT or PE, or any recent surgeries.  She denies any history of cancer.  HPI     Past Medical History:  Diagnosis Date   Abnormality of gait 04/19/2016   Acute infective polyneuritis ([place]) 2002   Allergic rhinitis due to pollen    Chronic pain syndrome    COPD (chronic obstructive pulmonary disease) (HCC)    chronic bronchitis   Depressive disorder, not elsewhere classified    Diabetes mellitus  without complication ([place])    Diaphragmatic hernia without mention of obstruction or gangrene    Dyslipidemia    Fibromyalgia    GERD (gastroesophageal reflux disease)    with h/o esophagitis   Guillain-Barre syndrome (HCC)    History of benign thymus tumor    Insomnia, unspecified    Lumbar spinal stenosis 03/30/2018   L4-5 level, severe   Miscarriage 1962   Mixed hyperlipidemia    Morbid obesity ([place])    Osteoporosis    Pneumonia    Polyneuropathy in diabetes(357.2)    Rheumatoid arthritis(714.0)    Spondylosis, lumbosacral    Spontaneous ecchymoses    Type II or unspecified type diabetes mellitus with peripheral circulatory disorders, uncontrolled(250.72)    Unspecified chronic bronchitis (HCC)    Unspecified essential hypertension    Unspecified hypothyroidism    Unspecified pruritic disorder    Unspecified urinary incontinence     Patient Active Problem List   Diagnosis Date Noted   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   Primary osteoarthritis of right knee 07/25/2019   Wound dehiscence 08/17/2018   Lumbar spinal stenosis 03/30/2018   Rheumatoid arthritis involving both wrists with positive rheumatoid factor ([place]) 06/14/2016   High risk medication use 06/14/2016   Sjogren's syndrome ([place]) 06/14/2016   Primary osteoarthritis of both knees 06/14/2016   DDD (degenerative disc disease), lumbar 06/14/2016   Hypothyroidism  06/14/2016   Osteoporosis 06/14/2016   Abnormality of gait 04/19/2016   Type 2 diabetes mellitus with diabetic polyneuropathy, with long-term current use of insulin ([place]) 11/27/2015   Diabetes mellitus without complication ([place]) 123456   Headache(784.0) 07/31/2013   Sinus infection 07/31/2013   Chronic right-sided low back pain with right-sided sciatica 03/14/2013   Insomnia 12/06/2012   Nausea alone 12/06/2012   Diarrhea 12/06/2012   Urinary incontinence, urge 12/06/2012   Lumbosacral root lesions, not elsewhere classified 11/27/2012    Diabetic polyneuropathy associated with type 2 diabetes mellitus ([place]) 11/27/2012   EDEMA 05/04/2010   YEAST INFECTION 05/03/2010   Hyperlipidemia 05/03/2010   DEPRESSION 05/03/2010   History of peripheral neuropathy 05/03/2010   Essential hypertension 05/03/2010   ALLERGIC RHINITIS 05/03/2010   PNEUMONIA 05/03/2010   COPD (chronic obstructive pulmonary disease) ([place]) 05/03/2010   GERD 05/03/2010   Fibromyalgia 05/03/2010   DYSPNEA 05/03/2010   CHEST PAIN 05/03/2010    Past Surgical History:  Procedure Laterality Date   ABDOMINAL HYSTERECTOMY  1974   abdominal tumor  2002   APPENDECTOMY     APPLICATION OF A-CELL OF BACK N/A 09/26/2018   Procedure: With Acell;  Surgeon: Wallace Going, DO;  Location: [facility];  Service: Plastics;  Laterality: N/A;   APPLICATION OF WOUND VAC N/A 09/26/2018   Procedure: Vac placement;  Surgeon: Wallace Going, DO;  Location: [facility];  Service: Plastics;  Laterality: N/A;   BACK SURGERY  1982   BRONCHOSCOPY  2001   CATARACT EXTRACTION, BILATERAL  09/2019, 10/2019   CHOLECYSTECTOMY  1984   INCISION AND DRAINAGE OF WOUND N/A 09/26/2018   Procedure: Debridement of spine wound;  Surgeon: Wallace Going, DO;  Location: [facility];  Service: Plastics;  Laterality: N/A;   KNEE SURGERY Bilateral 08/27/2010 (L) and 01/11/2011 (R)   LUMBAR LAMINECTOMY/DECOMPRESSION MICRODISCECTOMY N/A 07/03/2018   Procedure: Lumbar Three to Lumbar Five Laminectomy;  Surgeon: Judith Part, MD;  Location: [facility];  Service: Neurosurgery;  Laterality: N/A;   LUMBAR WOUND DEBRIDEMENT N/A 08/20/2018   Procedure: POSTERIOR LUMBAR SPINAL WOUND DEBRIDEMENT AND REVISION;  Surgeon: Judith Part, MD;  Location: [facility];  Service: Neurosurgery;  Laterality: N/A;  POSTERIOR LUMBAR SPINAL WOUND REVISION   miscarrage  1962   OVARIAN CYST SURGERY  1968   thymus tumor     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   TONSILLECTOMY       OB History   No obstetric history on file.     Family  History  Problem Relation Age of Onset   Alzheimer's disease Mother    Heart disease Mother    Heart disease Father    Liver disease Father    Cancer Brother    Arthritis Son    Colon polyps Brother    Colon cancer Neg Hx    Esophageal cancer Neg Hx    Kidney disease Neg Hx    Stomach cancer Neg Hx    Rectal cancer Neg Hx     Social History   Tobacco Use   Smoking status: Former    Packs/day: 0.10    Years: 10.00    Pack years: 1.00    Types: Cigarettes    Quit date: 12/07/1979    Years since quitting: 41.3   Smokeless tobacco: Never  Vaping Use   Vaping Use: Never used  Substance Use Topics   Alcohol use: No    Alcohol/week: 0.0 standard drinks   Drug use: No  Home Medications Prior to Admission medications   Medication Sig Start Date End Date Taking? Authorizing Provider  acetaminophen (TYLENOL) 325 MG tablet Take 650 mg by mouth every 6 (six) hours as needed (for pain.).   Yes [provider]  amitriptyline (ELAVIL) 25 MG tablet TAKE 1 TABLET(25 MG) BY MOUTH AT BEDTIME Patient taking differently: Take 25 mg by mouth at bedtime. 12/28/20  Yes Raulkar, Clide Deutscher, MD  amLODipine (NORVASC) 10 MG tablet TAKE 1 TABLET(10 MG) BY MOUTH DAILY Patient taking differently: Take 10 mg by mouth daily. 11/10/20  Yes Reed, Tiffany L, DO  aspirin EC 81 MG tablet Take 81 mg by mouth 2 (two) times a week.   Yes [provider]  cholecalciferol (VITAMIN D3) 25 MCG (1000 UT) tablet Take 1,000 Units by mouth daily.   Yes [provider]  Cranberry 475 MG CAPS Take 1 capsule (475 mg total) by mouth 2 (two) times daily. 02/12/21  Yes Ngetich, Dinah C, NP  docusate sodium (COLACE) 100 MG capsule Take 100 mg by mouth daily as needed for mild constipation.   Yes [provider]  ezetimibe (ZETIA) 10 MG tablet Take 1 tablet (10 mg total) by mouth daily. 06/15/20  Yes Reed, Tiffany L, DO  fluconazole (DIFLUCAN) 150 MG tablet Take 150 mg by mouth once. 03/08/21   Yes [provider]  folic acid (FOLVITE) 1 MG tablet TAKE 2 TABLETS BY MOUTH  DAILY Patient taking differently: Take 1 mg by mouth daily. 04/13/20  Yes Reed, Tiffany L, DO  furosemide (LASIX) 20 MG tablet TAKE 1 TABLET BY MOUTH TWICE DAILY AS NEEDED FOR 3 POUND WEIGHT GAIN IN ONE DAY OR 5 POUNDS IN ONE WEEK Patient taking differently: Take 20 mg by mouth 2 (two) times daily as needed. '20mg'$  twice a day if needed for 3lb weight gain in 1 day for 5lb weight gain in 1 week. 07/27/20  Yes Reed, Tiffany L, DO  gabapentin (NEURONTIN) 600 MG tablet TAKE 1 TABLET(600 MG) BY MOUTH THREE TIMES DAILY Patient taking differently: Take 600 mg by mouth 2 (two) times daily. 03/29/21  Yes Ngetich, Dinah C, NP  insulin lispro (HUMALOG KWIKPEN) 200 UNIT/ML KwikPen Max daily 80 units Patient taking differently: Inject 2-5 Units into the skin in the morning and at bedtime. Per sliding scale 12/23/20  Yes Shamleffer, Melanie Crazier, MD  ipratropium-albuterol (DUONEB) 0.5-2.5 (3) MG/3ML SOLN Take 3 mLs by nebulization every 6 (six) hours as needed. Patient taking differently: Take 3 mLs by nebulization every 6 (six) hours as needed (wheezing/ shortness of breath). 09/07/20  Yes Reed, Tiffany L, DO  levothyroxine (SYNTHROID) 112 MCG tablet Take 1 tablet (112 mcg total) by mouth daily. 12/23/20  Yes Shamleffer, Melanie Crazier, MD  lidocaine (LIDODERM) 5 % Place 1 patch onto the skin daily. Remove & Discard patch within 12 hours or as directed by MD Patient taking differently: Place 1 patch onto the skin daily as needed (right shoulder pain). Remove & Discard patch within 12 hours or as directed by MD 10/01/20  Yes Raulkar, Clide Deutscher, MD  liraglutide (VICTOZA) 18 MG/3ML SOPN Administer 1.'8mg'$  under the skin daily for blood sugar. 03/08/21  Yes Ngetich, Dinah C, NP  lisinopril (ZESTRIL) 20 MG tablet Take one tablet by mouth once daily. Patient taking differently: Take 20 mg by mouth daily. 02/10/21  Yes Ngetich, Dinah C, NP   loperamide (IMODIUM A-D) 2 MG tablet Take 2 mg by mouth 4 (four) times daily as needed for diarrhea  or loose stools.   Yes [provider]  Melatonin 10 MG TABS Take 10 mg by mouth at bedtime as needed (sleep).   Yes [provider]  Menthol, Topical Analgesic, (ICY HOT MEDICATED SPRAY EX) Apply 1 application topically as needed (shoulder pain).   Yes [provider]  metoprolol tartrate (LOPRESSOR) 50 MG tablet Take one tablet by mouth twice daily. Patient taking differently: Take 50 mg by mouth 2 (two) times daily. 02/08/21  Yes Ngetich, Dinah C, NP  Multiple Vitamins-Minerals (MULTIVITAMIN ADULT PO) Take 1 tablet by mouth daily.   Yes [provider]  nystatin (NYSTATIN) powder APPLY TOPICALLY THREE TIMES DAILY AS DIRECTED FOR 14 DAYS and prn yeast infection Patient taking differently: Apply 1 application topically 3 (three) times daily as needed (yeast). 08/19/20  Yes Reed, Tiffany L, DO  nystatin cream (MYCOSTATIN) Apply 1 application topically 3 (three) times daily. To skin folds Patient taking differently: Apply 1 application topically 3 (three) times daily as needed (yeast). To skin folds 03/02/21  Yes Ngetich, Dinah C, NP  omega-3 acid ethyl esters (LOVAZA) 1 g capsule TAKE ONE CAPSULE BY MOUTH EVERY DAY Patient taking differently: Take 1 g by mouth daily. 08/04/16  Yes Reed, Tiffany L, DO  oxyCODONE-acetaminophen (PERCOCET) 10-325 MG tablet Take 1 tablet by mouth every 6 (six) hours as needed for pain. 03/11/21  Yes Bayard Hugger, NP  polyvinyl alcohol (LIQUIFILM TEARS) 1.4 % ophthalmic solution Place 1 drop into both eyes 4 (four) times daily as needed (for dry/irritated eyes).    Yes [provider]  potassium chloride (MICRO-K) 10 MEQ CR capsule Take 2 capsules (20 mEq total) by mouth 2 (two) times daily as needed (when taking a dose of lasix.). 06/15/20  Yes Reed, Tiffany L, DO  sodium chloride (OCEAN) 0.65 % nasal spray Place 2 sprays into  the nose as needed for congestion. 12/09/20  Yes Ngetich, Dinah C, NP  tiotropium (SPIRIVA) 18 MCG inhalation capsule Place 1 capsule (18 mcg total) into inhaler and inhale daily. Patient taking differently: Place 18 mcg into inhaler and inhale daily as needed (wheezing). 06/15/20  Yes Reed, Tiffany L, DO  tiZANidine (ZANAFLEX) 2 MG tablet TAKE 1 TABLET(2 MG) BY MOUTH THREE TIMES DAILY Patient taking differently: Take 2 mg by mouth every 8 (eight) hours as needed for muscle spasms. 03/29/21  Yes Ngetich, Dinah C, NP  TOUJEO SOLOSTAR 300 UNIT/ML Solostar Pen INJECT 100 UNITS INTO THE SKIN EVERY NIGHT AT BEDTIME Patient taking differently: Inject 100 Units into the skin at bedtime. 10/27/20  Yes Reed, Tiffany L, DO  triamterene-hydrochlorothiazide (MAXZIDE-25) 37.5-25 MG tablet Take 1 tablet by mouth daily. 02/23/21  Yes Ngetich, Dinah C, NP  valACYclovir (VALTREX) 1000 MG tablet Take 1 tablet (1,000 mg total) by mouth 3 (three) times daily for 7 days. 03/31/21 04/07/21 Yes Wyvonnia Dusky, MD  venlafaxine XR (EFFEXOR-XR) 75 MG 24 hr capsule TAKE 1 CAPSULE BY MOUTH EVERY DAY WITH BREAKFAST Patient taking differently: Take 75 mg by mouth daily in the afternoon. 03/29/21  Yes Ngetich, Dinah C, NP  XELJANZ 5 MG TABS TAKE 1 TABLET BY MOUTH  DAILY Patient taking differently: Take 5 mg by mouth daily. 12/16/20  Yes Ofilia Neas, PA-C  Insulin Pen Needle (B-D UF III MINI PEN NEEDLES) 31G X 5 MM MISC USE AS DIRECTED 12/09/20   Ngetich, Dinah C, NP  Vibegron (GEMTESA) 75 MG TABS Take 1 tablet by mouth daily. Patient not taking: Reported on 03/31/2021  06/15/20   Reed, Tiffany L, DO    Allergies    Influenza vaccines, Penicillins, Statins, and Sulfamethoxazole-trimethoprim  Review of Systems   Review of Systems  Constitutional:  Negative for chills and fever.  HENT:  Negative for ear pain and sore throat.   Eyes:  Negative for pain and visual disturbance.  Respiratory:  Positive for cough and shortness of breath.    Cardiovascular:  Positive for chest pain and leg swelling.  Gastrointestinal:  Negative for abdominal pain, diarrhea, nausea and vomiting.  Genitourinary:  Negative for dysuria and hematuria.  Musculoskeletal:  Negative for arthralgias and back pain.  Skin:  Negative for color change and rash.  Neurological:  Negative for seizures and syncope.  All other systems reviewed and are negative.  Physical Exam Updated Vital Signs BP (!) 179/78   Pulse 96   Temp 98 F (36.7 C) (Oral)   Resp 16   Ht '5\' 6"'$  (1.676 m)   Wt 105 kg   SpO2 98%   BMI 37.36 kg/m   Physical Exam Constitutional:      General: She is not in acute distress.    Appearance: She is obese.  HENT:     Head: Normocephalic and atraumatic.  Eyes:     Conjunctiva/sclera: Conjunctivae normal.     Pupils: Pupils are equal, round, and reactive to light.  Cardiovascular:     Rate and Rhythm: Normal rate and regular rhythm.  Pulmonary:     Effort: Pulmonary effort is normal. No respiratory distress.  Abdominal:     General: There is no distension.     Tenderness: There is no abdominal tenderness.  Musculoskeletal:     Right lower leg: Edema present.     Left lower leg: Edema present.  Skin:    General: Skin is warm and dry.     Comments: Vesicular rash on right forearm  Neurological:     General: No focal deficit present.     Mental Status: She is alert. Mental status is at baseline.  Psychiatric:        Mood and Affect: Mood normal.        Behavior: Behavior normal.    ED Results / Procedures / Treatments   Labs (all labs ordered are listed, but only abnormal results are displayed) Labs Reviewed  BASIC METABOLIC PANEL - Abnormal; Notable for the following components:      Result Value   Creatinine, Ser 1.13 (*)    GFR, Estimated 49 (*)    All other components within normal limits  CBC WITH DIFFERENTIAL/PLATELET - Abnormal; Notable for the following components:   RBC 5.24 (*)    Neutro Abs 8.8 (*)     All other components within normal limits  D-DIMER, QUANTITATIVE - Abnormal; Notable for the following components:   D-Dimer, Quant 1.40 (*)    All other components within normal limits  CBG MONITORING, ED - Abnormal; Notable for the following components:   Glucose-Capillary 46 (*)    All other components within normal limits  CBG MONITORING, ED - Abnormal; Notable for the following components:   Glucose-Capillary 59 (*)    All other components within normal limits  CBG MONITORING, ED - Abnormal; Notable for the following components:   Glucose-Capillary 148 (*)    All other components within normal limits  RESP PANEL BY RT-PCR (FLU A&B, COVID) [id number]  CBG MONITORING, ED  CBG MONITORING, ED  TROPONIN I (HIGH SENSITIVITY)  TROPONIN I (HIGH SENSITIVITY)  EKG EKG Interpretation  Date/Time:  Wednesday March 31 2021 08:14:37 EDT Ventricular Rate:  72 PR Interval:  208 QRS Duration: 82 QT Interval:  390 QTC Calculation: 427 R Axis:   46 Text Interpretation: Sinus rhythm with Premature supraventricular complexes Otherwise normal ECG Confirmed by Octaviano Glow [phone]) on 03/31/2021 8:36:21 AM  Radiology DG Chest 2 View  Result Date: 03/31/2021 CLINICAL DATA:  Provided history: Right-sided pleuritic chest pain, evaluate for pneumonia versus pneumothorax. Additional history provided: Patient reports right-sided chest pain radiating through to back and right arm, rash on right arm with swelling, shortness of breath. EXAM: CHEST - 2 VIEW COMPARISON:  Prior chest radiographs 09/10/2020. CT angiogram chest FINDINGS: Prior median sternotomy. Heart size within normal limits. Aortic atherosclerosis. Redemonstrated mild scarring within the lateral left lung base. No appreciable airspace consolidation or pulmonary edema. No evidence of pleural effusion or pneumothorax. No evidence of acute bony abnormality. Redemonstrated mild chronic superior endplate vertebral compression deformity within the  upper lumbar spine. IMPRESSION: No evidence of acute cardiopulmonary abnormality. Redemonstrated mild scarring within the lateral left lung base. Aortic Atherosclerosis (ICD10-I70.0). Electronically Signed   By: Kellie Simmering DO   On: 03/31/2021 09:38   CT Angio Chest PE W and/or Wo Contrast  Result Date: 03/31/2021 CLINICAL DATA:  Chest pain. EXAM: CT ANGIOGRAPHY CHEST WITH CONTRAST TECHNIQUE: Multidetector CT imaging of the chest was performed using the standard protocol during bolus administration of intravenous contrast. Multiplanar CT image reconstructions and MIPs were obtained to evaluate the vascular anatomy. CONTRAST:  63m OMNIPAQUE IOHEXOL 350 MG/ML SOLN COMPARISON:  October 20, 2011. FINDINGS: Cardiovascular: Satisfactory opacification of the pulmonary arteries to the segmental level. No evidence of pulmonary embolism. Normal heart size. No pericardial effusion. Atherosclerosis of thoracic aorta is noted without aneurysm or dissection. Mediastinum/Nodes: No enlarged mediastinal, hilar, or axillary lymph nodes. Thyroid gland, trachea, and esophagus demonstrate no significant findings. Lungs/Pleura: Lungs are clear. No pleural effusion or pneumothorax. Upper Abdomen: No acute abnormality. Musculoskeletal: No chest wall abnormality. No acute or significant osseous findings. Review of the MIP images confirms the above findings. IMPRESSION: No definite evidence of pulmonary embolus. No acute abnormality seen in the chest. Aortic Atherosclerosis (ICD10-I70.0). Electronically Signed   By: JMarijo ConceptionM.D.   On: 03/31/2021 12:56    Procedures Procedures   Medications Ordered in ED Medications  valACYclovir (VALTREX) tablet 1,000 mg (1,000 mg Oral Given 03/31/21 1035)  iohexol (OMNIPAQUE) 350 MG/ML injection 100 mL (80 mLs Intravenous Contrast Given 03/31/21 1218)    ED Course  I have reviewed the triage vital signs and the nursing notes.  Pertinent labs & imaging results that were available  during my care of the patient were reviewed by me and considered in my medical decision making (see chart for details).  This patient presents to the Emergency Department with complaint of chest pain. This involves an extensive number of treatment options, and is a complaint that carries with it a high risk of complications and morbidity.  The differential diagnosis includes ACS vs Pneumothorax vs Reflux/Gastritis vs MSK pain vs Pneumonia vs PE vs cervical radicular pain vs other.  The rash on her forearm appears very consistent with a shingles outbreak.  Contact precautions initiated.  We'll start on valtrex.  I don't see signs of disseminated zoster on exam.  I ordered, reviewed, and interpreted labs, BMP and CBC at baseline, trop low at 6-> 5.  Ddimer 1.4.    Doubt ACS.  Glucose initially low, improved with  food (she did not eat this morning). BS remained stable. I ordered imaging studies which included dg chest, CT PE I independently visualized and interpreted imaging which showed no acute abnormalities and the monitor tracing which showed sinus rhythm Additional history was obtained from patient's son at bedside I personally reviewed the patients ECG which showed sinus rhythm with no acute ischemic findings  After the interventions stated above, I reevaluated the patient and found that they remained clinically stable.  Based on the patient's clinical exam, vital signs, risk factors, and ED testing, I felt that the patient's overall risk of life-threatening emergency such as ACS, PE, or sepsis was low.  At this time, I felt the patient's presentation was most clinically consistent with zoster and radicular neck pain, but explained to the patient that this evaluation was not a definitive diagnostic workup.  I discussed outpatient follow up with primary care provider, and provided specialist office number on the patient's discharge paper if a referral was deemed necessary.  Return precautions were  discussed with the patient.  I felt the patient was clinically stable for discharge.   Clinical Course as of 03/31/21 1828  Wed Mar 31, 2021  [id number] EKG per my interpretation shows normal sinus rhythm with sinus arrhythmia, no obvious ischemic findings. [MT]  [phone] Chart review demonstrates that she was recently evaluated by oncology in June of this year for leukocytosis.  She is currently undergoing testing for this.  [MT]  1320 Delta trop flat 6->5, BS improved, CTPE negative.  Reassessed the patient okay for discharge. [MT]    Clinical Course User Index [MT] Wyvonnia Dusky, MD    Final Clinical Impression(s) / ED Diagnoses Final diagnoses:  Herpes zoster without complication  Chest pain, unspecified type    Rx / DC Orders ED Discharge Orders          Ordered    valACYclovir (VALTREX) 1000 MG tablet  3 times daily        03/31/21 1329             Wyvonnia Dusky, MD 03/31/21 [phone]

## 2021-03-31 NOTE — ED Notes (Signed)
Provider at the bedside.  

## 2021-03-31 NOTE — ED Notes (Signed)
Pt to CT at this time.

## 2021-03-31 NOTE — ED Triage Notes (Signed)
Pt reports right sided chest pain radiating through to back and right arm and rash to right arm with swelling and shob on Friday.

## 2021-03-31 NOTE — ED Notes (Signed)
Provider at the bedside. Pt's POC BG at this time is 59, despite 3, 8oz orange juices. Per Provider's verbal order, pt given graham crackers and apple sauce at this time. No additional orders at this time. Will re-assess.

## 2021-04-02 ENCOUNTER — Telehealth: Payer: Self-pay

## 2021-04-02 NOTE — Telephone Encounter (Signed)
Incoming call received from Will, physical therapist with [facility] requesting verbal orders to reschedule occupational therapy discharge to next week , per patients request  Per [facility] standing order, verbal order given. Message will be sent to patient's provider as a FYI.

## 2021-04-02 NOTE — Telephone Encounter (Signed)
Noted  

## 2021-04-06 ENCOUNTER — Telehealth: Payer: Self-pay

## 2021-04-06 NOTE — Telephone Encounter (Signed)
Refill Valcyclovir 1000 mg three times daily x 7 days

## 2021-04-06 NOTE — Telephone Encounter (Signed)
Patient called and requested a refill for Valacyclovir 1000 MG. She reports having shingles since 03/31/2021 and the doctor at the hospital advised her to contact her primary care provider for additional refills. Patient reports she is currently she in pain and the shingles are still visible on her body. Please advise.

## 2021-04-06 NOTE — Telephone Encounter (Signed)
Patient was advised.  

## 2021-04-07 ENCOUNTER — Other Ambulatory Visit: Payer: Self-pay | Admitting: Family

## 2021-04-07 ENCOUNTER — Other Ambulatory Visit: Payer: Self-pay

## 2021-04-07 NOTE — Telephone Encounter (Signed)
Patient has request refill on medication "Valacyclovir '1000mg'$ ". Patient last refill for medication was 03/31/2021 with 21 tablets to be taken 3 times daily for 7 days. Medication pend and sent to PCP Ngetich, Nelda Bucks, NP for editing additional refills.

## 2021-04-09 ENCOUNTER — Telehealth: Payer: Self-pay | Admitting: *Deleted

## 2021-04-09 ENCOUNTER — Ambulatory Visit: Payer: Medicare Other | Admitting: Family

## 2021-04-09 DIAGNOSIS — E1169 Type 2 diabetes mellitus with other specified complication: Secondary | ICD-10-CM | POA: Diagnosis not present

## 2021-04-09 DIAGNOSIS — E1142 Type 2 diabetes mellitus with diabetic polyneuropathy: Secondary | ICD-10-CM | POA: Diagnosis not present

## 2021-04-09 DIAGNOSIS — R350 Frequency of micturition: Secondary | ICD-10-CM | POA: Diagnosis not present

## 2021-04-09 DIAGNOSIS — R2681 Unsteadiness on feet: Secondary | ICD-10-CM | POA: Diagnosis not present

## 2021-04-09 DIAGNOSIS — E785 Hyperlipidemia, unspecified: Secondary | ICD-10-CM | POA: Diagnosis not present

## 2021-04-09 DIAGNOSIS — E039 Hypothyroidism, unspecified: Secondary | ICD-10-CM | POA: Diagnosis not present

## 2021-04-09 DIAGNOSIS — Z794 Long term (current) use of insulin: Secondary | ICD-10-CM | POA: Diagnosis not present

## 2021-04-09 DIAGNOSIS — R531 Weakness: Secondary | ICD-10-CM | POA: Diagnosis not present

## 2021-04-09 NOTE — Telephone Encounter (Signed)
Katherine Delgado with [facility] called and stated that he is with patient and has several concerns:  1.Blood Pressure is 180/92  2.Having 10/10 pain with Shingles  3.Level 2 Warning with Valacyclovir and Tizanidine.   I called patient and scheduled an appointment to address concerns for today with Dinah.

## 2021-04-09 NOTE — Telephone Encounter (Signed)
Noted  

## 2021-04-09 NOTE — Telephone Encounter (Signed)
Patient called and left message on Clinical intake stating that her increase blood pressure if coming from the Shingles Pain and she rechecked it and it was 125/65.  No Showed for appointment.

## 2021-04-12 ENCOUNTER — Other Ambulatory Visit: Payer: Self-pay

## 2021-04-12 ENCOUNTER — Other Ambulatory Visit: Payer: Self-pay | Admitting: Family

## 2021-04-12 ENCOUNTER — Telehealth: Payer: Self-pay | Admitting: *Deleted

## 2021-04-12 ENCOUNTER — Encounter: Payer: Self-pay | Admitting: Family

## 2021-04-12 ENCOUNTER — Ambulatory Visit (INDEPENDENT_AMBULATORY_CARE_PROVIDER_SITE_OTHER): Payer: Medicare Other | Admitting: Family

## 2021-04-12 VITALS — BP 138/60 | HR 70 | Temp 97.5°F | Resp 18 | Ht 66.0 in | Wt 216.6 lb

## 2021-04-12 DIAGNOSIS — N3281 Overactive bladder: Secondary | ICD-10-CM

## 2021-04-12 DIAGNOSIS — B0223 Postherpetic polyneuropathy: Secondary | ICD-10-CM

## 2021-04-12 MED ORDER — MIRABEGRON ER 50 MG PO TB24: 50.0000 mg | ORAL_TABLET | Freq: Every day | ORAL | 5 refills | Status: DC

## 2021-04-12 MED ORDER — CALAMINE EX LOTN: 1.0000 "application " | TOPICAL_LOTION | Freq: Three times a day (TID) | CUTANEOUS | 0 refills | Status: DC

## 2021-04-12 NOTE — Telephone Encounter (Signed)
Katherine Delgado is asking for Katherine Delgado to call her. She has developed shingles and is asking about pain medication for it.

## 2021-04-12 NOTE — Progress Notes (Signed)
Provider: Marlowe Sax FNP-C  Dorri Ozturk, Nelda Bucks, NP  Patient Care Team: Jakyrah Holladay, Nelda Bucks, NP as PCP - General (Family Medicine) Bo Merino, MD (Rheumatology) Altheimer, Legrand Como, MD as Attending Physician (Endocrinology) Newt Minion, MD as Consulting Physician (Orthopedic Surgery)  Extended Emergency Contact Information Primary Emergency Contact: [facility] Address: [address]          [place], [place] [address] Johnnette Litter of [place] Phone: [phone] Relation: Son Secondary Emergency Contact: Marlana Latus, [place] Mobile Phone: [phone] Relation: Son  Code Status:  Full Code  Goals of care: Advanced Directive information Advanced Directives 04/12/2021  Does Patient Have a Medical Advance Directive? No  Type of Advance Directive -  Does patient want to make changes to medical advance directive? No - Patient declined  Copy of [place] in Chart? -  Would patient like information on creating a medical advance directive? -  Pre-existing out of facility DNR order (yellow form or pink MOST form) -     Chief Complaint  Patient presents with   Acute Visit    Complains of shingles pain.    HPI:  Pt is a 80 y.o. female seen today for an acute visit for evaluation of shingles pain.she is here with son today.she was in ED 03/31/2021 for shingles rash.she was prescribed Valacyclovir 1000 mg tablet three times daily for 7 days.she completed valacyclovir as directed but called the office on 04/06/2021 requesting refill since rash had not cleared.Another 7 days course was prescribed.States rash has dried up but still has pain from right upper arm to the hand.states pain is much better today since son has been applying benadryl gel and wrapping arm. Takes Gabapentin 600 mg tablet  twice daily.  She denies any fever,chills,drainage or new breakout of rash.  Also on Percocet 10 - 325 mg tablet  every 6 hrs as needed for pain managed by Pain specialist.    Past Medical History:  Diagnosis Date   Abnormality of gait 04/19/2016   Acute infective polyneuritis ([place]) 2002   Allergic rhinitis due to pollen    Chronic pain syndrome    COPD (chronic obstructive pulmonary disease) (HCC)    chronic bronchitis   Depressive disorder, not elsewhere classified    Diabetes mellitus without complication ([place])    Diaphragmatic hernia without mention of obstruction or gangrene    Dyslipidemia    Fibromyalgia    GERD (gastroesophageal reflux disease)    with h/o esophagitis   Guillain-Barre syndrome (HCC)    History of benign thymus tumor    Insomnia, unspecified    Lumbar spinal stenosis 03/30/2018   L4-5 level, severe   Miscarriage 1962   Mixed hyperlipidemia    Morbid obesity ([place])    Osteoporosis    Pneumonia    Polyneuropathy in diabetes(357.2)    Rheumatoid arthritis(714.0)    Spondylosis, lumbosacral    Spontaneous ecchymoses    Type II or unspecified type diabetes mellitus with peripheral circulatory disorders, uncontrolled(250.72)    Unspecified chronic bronchitis (HCC)    Unspecified essential hypertension    Unspecified hypothyroidism    Unspecified pruritic disorder    Unspecified urinary incontinence    Past Surgical History:  Procedure Laterality Date   ABDOMINAL HYSTERECTOMY  1974   abdominal tumor  2002   APPENDECTOMY     APPLICATION OF A-CELL OF BACK N/A 09/26/2018   Procedure: With Acell;  Surgeon: Marla Roe,  Loel Lofty, DO;  Location: [facility];  Service: Plastics;  Laterality: N/A;   APPLICATION OF WOUND VAC N/A 09/26/2018   Procedure: Vac placement;  Surgeon: Wallace Going, DO;  Location: [facility];  Service: Plastics;  Laterality: N/A;   BACK SURGERY  1982   BRONCHOSCOPY  2001   CATARACT EXTRACTION, BILATERAL  09/2019, 10/2019   CHOLECYSTECTOMY  1984   INCISION AND DRAINAGE OF WOUND N/A 09/26/2018   Procedure: Debridement of spine wound;  Surgeon: Wallace Going, DO;  Location: [facility];  Service: Plastics;  Laterality: N/A;   KNEE SURGERY Bilateral 08/27/2010 (L) and 01/11/2011 (R)   LUMBAR LAMINECTOMY/DECOMPRESSION MICRODISCECTOMY N/A 07/03/2018   Procedure: Lumbar Three to Lumbar Five Laminectomy;  Surgeon: Judith Part, MD;  Location: [facility];  Service: Neurosurgery;  Laterality: N/A;   LUMBAR WOUND DEBRIDEMENT N/A 08/20/2018   Procedure: POSTERIOR LUMBAR SPINAL WOUND DEBRIDEMENT AND REVISION;  Surgeon: Judith Part, MD;  Location: [facility];  Service: Neurosurgery;  Laterality: N/A;  POSTERIOR LUMBAR SPINAL WOUND REVISION   miscarrage  1962   OVARIAN CYST SURGERY  1968   thymus tumor     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   TONSILLECTOMY      Allergies  Allergen Reactions   Influenza Vaccines Other (See Comments)    "h/o Guillain Barre; dr's told me years ago never to take another flu shot as it could relapse Rosalee Kaufman; has to do with when vaccine being changed to H1N1 virus"   Penicillins Hives, Itching, Swelling and Rash    Has patient had a PCN reaction causing immediate rash, facial/tongue/throat swelling, SOB or lightheadedness with hypotension:Unknown Has patient had a PCN reaction causing severe rash involving mucus membranes or skin necrosis: Unknown Has patient had a PCN reaction that required hospitalization:Unknown Has patient had a PCN reaction occurring within the last 10 years: Unknown If all of the above answers are "NO", then may proceed with Cephalosporin use.    Statins Other (See Comments)    Myopathy, transaminitis   Sulfamethoxazole-Trimethoprim Itching    Outpatient Encounter Medications as of 04/12/2021  Medication Sig   acetaminophen (TYLENOL) 325 MG tablet Take 650 mg by mouth every 6 (six) hours as needed (for pain.).   amitriptyline (ELAVIL) 25 MG tablet TAKE 1 TABLET(25 MG) BY MOUTH AT BEDTIME   amLODipine (NORVASC) 10 MG tablet TAKE 1 TABLET(10 MG) BY MOUTH DAILY   aspirin EC 81 MG tablet Take 81 mg by  mouth 2 (two) times a week.   cholecalciferol (VITAMIN D3) 25 MCG (1000 UT) tablet Take 1,000 Units by mouth daily.   Cranberry 475 MG CAPS Take 1 capsule (475 mg total) by mouth 2 (two) times daily.   docusate sodium (COLACE) 100 MG capsule Take 100 mg by mouth daily as needed for mild constipation.   ezetimibe (ZETIA) 10 MG tablet Take 1 tablet (10 mg total) by mouth daily.   fluconazole (DIFLUCAN) 150 MG tablet Take 150 mg by mouth once.   folic acid (FOLVITE) 1 MG tablet TAKE 2 TABLETS BY MOUTH  DAILY   furosemide (LASIX) 20 MG tablet TAKE 1 TABLET BY MOUTH TWICE DAILY AS NEEDED FOR 3 POUND WEIGHT GAIN IN ONE DAY OR 5 POUNDS IN ONE WEEK   gabapentin (NEURONTIN) 600 MG tablet Take 600 mg by mouth 2 (two) times daily.   insulin lispro (HUMALOG KWIKPEN) 200 UNIT/ML KwikPen Max daily 80 units   Insulin Pen Needle (B-D UF III MINI PEN NEEDLES) 31G X  5 MM MISC USE AS DIRECTED   ipratropium-albuterol (DUONEB) 0.5-2.5 (3) MG/3ML SOLN Take 3 mLs by nebulization every 6 (six) hours as needed.   levothyroxine (SYNTHROID) 112 MCG tablet Take 1 tablet (112 mcg total) by mouth daily.   lidocaine (LIDODERM) 5 % Place 1 patch onto the skin daily. Remove & Discard patch within 12 hours or as directed by MD   liraglutide (VICTOZA) 18 MG/3ML SOPN Administer 1.'8mg'$  under the skin daily for blood sugar.   lisinopril (ZESTRIL) 20 MG tablet Take one tablet by mouth once daily.   loperamide (IMODIUM A-D) 2 MG tablet Take 2 mg by mouth 4 (four) times daily as needed for diarrhea or loose stools.   Melatonin 10 MG TABS Take 10 mg by mouth at bedtime as needed (sleep).   Menthol, Topical Analgesic, (ICY HOT MEDICATED SPRAY EX) Apply 1 application topically as needed (shoulder pain).   metoprolol tartrate (LOPRESSOR) 50 MG tablet Take one tablet by mouth twice daily.   Multiple Vitamins-Minerals (MULTIVITAMIN ADULT PO) Take 1 tablet by mouth daily.   nystatin (NYSTATIN) powder APPLY TOPICALLY THREE TIMES DAILY AS  DIRECTED FOR 14 DAYS and prn yeast infection   nystatin cream (MYCOSTATIN) Apply 1 application topically 3 (three) times daily. To skin folds   omega-3 acid ethyl esters (LOVAZA) 1 g capsule TAKE ONE CAPSULE BY MOUTH EVERY DAY   oxyCODONE-acetaminophen (PERCOCET) 10-325 MG tablet Take 1 tablet by mouth every 6 (six) hours as needed for pain.   polyvinyl alcohol (LIQUIFILM TEARS) 1.4 % ophthalmic solution Place 1 drop into both eyes 4 (four) times daily as needed (for dry/irritated eyes).    potassium chloride (MICRO-K) 10 MEQ CR capsule Take 2 capsules (20 mEq total) by mouth 2 (two) times daily as needed (when taking a dose of lasix.).   sodium chloride (OCEAN) 0.65 % nasal spray Place 2 sprays into the nose as needed for congestion.   tiotropium (SPIRIVA) 18 MCG inhalation capsule Place 18 mcg into inhaler and inhale as needed.   tiZANidine (ZANAFLEX) 2 MG tablet TAKE 1 TABLET(2 MG) BY MOUTH THREE TIMES DAILY   TOUJEO SOLOSTAR 300 UNIT/ML Solostar Pen INJECT 100 UNITS INTO THE SKIN EVERY NIGHT AT BEDTIME   triamterene-hydrochlorothiazide (MAXZIDE-25) 37.5-25 MG tablet Take 1 tablet by mouth daily.   valACYclovir (VALTREX) 1000 MG tablet TAKE 1 TABLET(1000 MG) BY MOUTH THREE TIMES DAILY FOR 7 DAYS   venlafaxine XR (EFFEXOR-XR) 75 MG 24 hr capsule TAKE 1 CAPSULE BY MOUTH EVERY DAY WITH BREAKFAST   XELJANZ 5 MG TABS TAKE 1 TABLET BY MOUTH  DAILY   [DISCONTINUED] gabapentin (NEURONTIN) 600 MG tablet TAKE 1 TABLET(600 MG) BY MOUTH THREE TIMES DAILY (Patient taking differently: No sig reported)   [DISCONTINUED] tiotropium (SPIRIVA) 18 MCG inhalation capsule Place 1 capsule (18 mcg total) into inhaler and inhale daily. (Patient taking differently: Place 18 mcg into inhaler and inhale daily as needed (wheezing).)   [DISCONTINUED] Vibegron (GEMTESA) 75 MG TABS Take 1 tablet by mouth daily. (Patient not taking: Reported on 03/31/2021)   No facility-administered encounter medications on file as of 04/12/2021.     Review of Systems  Constitutional:  Negative for appetite change, chills, fatigue and fever.  Respiratory:  Negative for cough, chest tightness, shortness of breath and wheezing.   Cardiovascular:  Negative for chest pain, palpitations and leg swelling.  Genitourinary:  Negative for dysuria, flank pain and hematuria.       OAB   Skin:  Negative for color change  and pallor.       Shingles rash per HPI   Neurological:  Positive for numbness. Negative for dizziness, speech difficulty, weakness and headaches.   Immunization History  Administered Date(s) Administered   Influenza-Unspecified 06/10/2011   PFIZER(Purple Top)SARS-COV-2 Vaccination 11/13/2019, 11/30/2019   Pneumococcal Conjugate-13 07/07/2016   Pneumococcal Polysaccharide-23 11/21/2003, 10/05/2017   Tdap 05/28/2016   Pertinent  Health Maintenance Due  Topic Date Due   OPHTHALMOLOGY EXAM  10/02/2020   HEMOGLOBIN A1C  08/14/2021   FOOT EXAM  10/15/2021   DEXA SCAN  09/20/2025   PNA vac Low Risk Adult  Completed   INFLUENZA VACCINE  Discontinued   Fall Risk  04/12/2021 03/11/2021 02/17/2021 02/12/2021 02/10/2021  Falls in the past year? 0 0 0 0 0  Number falls in past yr: 0 0 - 0 0  Comment - - - - -  Injury with Fall? 0 0 - 0 0  Risk for fall due to : No Fall Risks - - No Fall Risks No Fall Risks  Follow up Falls evaluation completed - - - -   Functional Status Survey:    Vitals:   04/12/21 1456  BP: 138/60  Pulse: 70  Resp: 18  Temp: (!) 97.5 F (36.4 C)  SpO2: 94%  Weight: 216 lb 9.6 oz (98.2 kg)  Height: '5\' 6"'$  (1.676 m)   Body mass index is 34.96 kg/m. Physical Exam Vitals reviewed.  Constitutional:      General: She is not in acute distress.    Appearance: Normal appearance. She is normal weight. She is not ill-appearing or diaphoretic.  HENT:     Head: Normocephalic.     Mouth/Throat:     Mouth: Mucous membranes are moist.     Pharynx: Oropharynx is clear. No oropharyngeal exudate or posterior  oropharyngeal erythema.  Eyes:     General: No scleral icterus.       Right eye: No discharge.        Left eye: No discharge.     Conjunctiva/sclera: Conjunctivae normal.     Pupils: Pupils are equal, round, and reactive to light.  Cardiovascular:     Rate and Rhythm: Normal rate and regular rhythm.     Pulses: Normal pulses.     Heart sounds: Normal heart sounds. No murmur heard.   No friction rub. No gallop.  Pulmonary:     Effort: Pulmonary effort is normal. No respiratory distress.     Breath sounds: Normal breath sounds. No wheezing, rhonchi or rales.  Chest:     Chest wall: No tenderness.  Abdominal:     General: Bowel sounds are normal. There is no distension.     Palpations: Abdomen is soft. There is no mass.     Tenderness: There is no abdominal tenderness. There is no right CVA tenderness, left CVA tenderness, guarding or rebound.  Skin:    General: Skin is warm and dry.     Coloration: Skin is not pale.     Findings: No bruising, erythema or lesion.     Comments: Right upper arm/forearm and hand scab areas noted.no vesicles or drainage noted.   Neurological:     Mental Status: She is alert and oriented to person, place, and time.     Cranial Nerves: No cranial nerve deficit.     Sensory: No sensory deficit.     Motor: No weakness.     Coordination: Coordination normal.     Gait: Gait abnormal.  Psychiatric:  Speech: Speech normal.    Labs reviewed: Recent Labs    01/25/21 1037 02/12/21 0957 03/31/21 0836  NA 141 138 136  K 3.9 4.2 3.5  CL 102 98 100  CO2 '28 29 24  '$ GLUCOSE 112* 127* 87  BUN '16 14 15  '$ CREATININE 0.92 0.96* 1.13*  CALCIUM 9.7 8.9 9.5   Recent Labs    01/19/21 1016 01/25/21 1037 02/12/21 0957  AST '22 26 16  '$ ALT '12 18 12  '$ ALKPHOS  --  96  --   BILITOT 0.3 0.4 0.4  PROT 6.9 7.4 5.9*  ALBUMIN  --  3.5  --    Recent Labs    01/25/21 1037 02/12/21 0957 03/31/21 0836  WBC 16.1* 16.2* 10.5  NEUTROABS 12.8* 13,268* 8.8*  HGB  13.8 11.4* 14.2  HCT 42.3 36.3 44.3  MCV 84.8 85.4 84.5  PLT 293 412* 258   Lab Results  Component Value Date   TSH 2.82 02/12/2021   Lab Results  Component Value Date   HGBA1C 7.7 (H) 02/12/2021   Lab Results  Component Value Date   CHOL 127 02/12/2021   HDL 17 (L) 02/12/2021   LDLCALC 76 02/12/2021   TRIG 259 (H) 02/12/2021   CHOLHDL 7.5 (H) 02/12/2021    Significant Diagnostic Results in last 30 days:  DG Chest 2 View  Result Date: 03/31/2021 CLINICAL DATA:  Provided history: Right-sided pleuritic chest pain, evaluate for pneumonia versus pneumothorax. Additional history provided: Patient reports right-sided chest pain radiating through to back and right arm, rash on right arm with swelling, shortness of breath. EXAM: CHEST - 2 VIEW COMPARISON:  Prior chest radiographs 09/10/2020. CT angiogram chest FINDINGS: Prior median sternotomy. Heart size within normal limits. Aortic atherosclerosis. Redemonstrated mild scarring within the lateral left lung base. No appreciable airspace consolidation or pulmonary edema. No evidence of pleural effusion or pneumothorax. No evidence of acute bony abnormality. Redemonstrated mild chronic superior endplate vertebral compression deformity within the upper lumbar spine. IMPRESSION: No evidence of acute cardiopulmonary abnormality. Redemonstrated mild scarring within the lateral left lung base. Aortic Atherosclerosis (ICD10-I70.0). Electronically Signed   By: Kellie Simmering DO   On: 03/31/2021 09:38   CT Angio Chest PE W and/or Wo Contrast  Result Date: 03/31/2021 CLINICAL DATA:  Chest pain. EXAM: CT ANGIOGRAPHY CHEST WITH CONTRAST TECHNIQUE: Multidetector CT imaging of the chest was performed using the standard protocol during bolus administration of intravenous contrast. Multiplanar CT image reconstructions and MIPs were obtained to evaluate the vascular anatomy. CONTRAST:  72m OMNIPAQUE IOHEXOL 350 MG/ML SOLN COMPARISON:  October 20, 2011. FINDINGS:  Cardiovascular: Satisfactory opacification of the pulmonary arteries to the segmental level. No evidence of pulmonary embolism. Normal heart size. No pericardial effusion. Atherosclerosis of thoracic aorta is noted without aneurysm or dissection. Mediastinum/Nodes: No enlarged mediastinal, hilar, or axillary lymph nodes. Thyroid gland, trachea, and esophagus demonstrate no significant findings. Lungs/Pleura: Lungs are clear. No pleural effusion or pneumothorax. Upper Abdomen: No acute abnormality. Musculoskeletal: No chest wall abnormality. No acute or significant osseous findings. Review of the MIP images confirms the above findings. IMPRESSION: No definite evidence of pulmonary embolus. No acute abnormality seen in the chest. Aortic Atherosclerosis (ICD10-I70.0). Electronically Signed   By: JMarijo ConceptionM.D.   On: 03/31/2021 12:56    Assessment/Plan 1. Post-herpetic polyneuropathy Rash has improved scab areas noted. - advised to complete valacyclovir as directed  - continue on Gabapentin and Percocet for pain  - add calamine lotion as below - calamine  lotion; Apply 1 application topically 3 (three) times daily. To affected areas on right fore arm  Dispense: 120 mL; Refill: 0 - Notify provider if symptoms worsen or not resolve  - will refer to dermatology if symptoms persist   2. Overactive bladder Chronic  Refill Mirabegron  - mirabegron ER (MYRBETRIQ) 50 MG TB24 tablet; Take 1 tablet (50 mg total) by mouth daily.  Dispense: 30 tablet; Refill: 5   Family/ staff Communication: Reviewed plan of care with patient and son verbalized understanding   Labs/tests ordered: None   Next Appointment: As needed if symptoms worsen or fail to improve    Sandrea Hughs, NP

## 2021-04-12 NOTE — Patient Instructions (Signed)
- continue on Gabapentin for pain  - Apply calamine lotion to affected areas on right arm   Postherpetic Neuralgia Postherpetic neuralgia (PHN) is nerve pain that occurs after a shingles infection. Shingles is a painful rash that appears on one area of the body, usually on the trunk or face. Shingles is caused by the varicella-zoster virus. This is the same virus that causes chickenpox. In people who have hadchickenpox, the virus can resurface years later and cause shingles. PHN appears in the same area where you had the shingles rash. The pain usually goes away after the rash disappears. You may have PHN if you continue to havepain 3 months after your shingles rash has gone away. What are the causes? This condition is caused by damage to your nerves due to inflammation from thevaricella-zoster virus. The damage makes your nerves overly sensitive. What increases the risk? The following factors may make you more likely to develop this condition: Being older than 80 years of age. Having severe pain before your shingles rash starts. Having a severe rash. Having shingles in and around the eye area. Having a disease or taking medicine that causes you to have a weakened disease-fighting system (immune system). What are the signs or symptoms? The main symptom of this condition is pain. The pain may: Often be severe and may be described as stabbing, burning, shooting, or feeling like an electric shock. Come and go, or it may be there all the time. Be triggered by light touches on the skin or changes in temperature. You may have itching along with the pain. How is this diagnosed? This condition may be diagnosed based on your symptoms and your history ofshingles. Lab studies and other diagnostic tests are usually not needed. How is this treated? There is no cure for this condition. Treatment for PHN will focus on pain relief. Over-the-counter pain relievers do not usually relieve PHN pain. You may need  to work with a pain specialist. Treatment may include: Anti-seizure medicines to relieve nerve pain. Antidepressant medicines to help with pain and improve sleep. A numbing patch worn on the skin (lidocaine patch). Strong pain relievers (opioids). Injections of numbing medicine or anti-inflammatory medicines around irritated nerves. Injections of botulinum toxin to block pain signals between nerves and muscles. Follow these instructions at home: Medicines Take over-the-counter and prescription medicines only as told by your health care provider. Ask your health care provider if the medicine prescribed to you: Requires you to avoid driving or using machinery. Can cause constipation. You may need to take these actions to prevent or treat constipation: Drink enough fluid to keep your urine pale yellow. Take over-the-counter or prescription medicines. Eat foods that are high in fiber, such as beans, whole grains, and fresh fruits and vegetables. Limit foods that are high in fat and processed sugars, such as fried or sweet foods. Managing pain  If directed, put ice on the painful area. To do this: Put ice in a plastic bag. Place a towel between your skin and the bag. Leave the ice on for 20 minutes, 2-3 times a day. Remove the ice if your skin turns bright red. This is very important. If you cannot feel pain, heat, or cold, you have a greater risk of damage to the area. Cover sensitive areas with a bandage (dressing) to reduce friction from clothing rubbing on the area.  General instructions It may take a long time to recover from PHN. Work closely with your health care provider and develop a good  support system at home. You may consider joining a support group. Wear loose, comfortable clothing. Talk to your health care provider if you feel depressed or desperate. Living with long-term pain can be depressing. Keep all follow-up visits. This is important. How is this prevented? Getting a  vaccination for shingles can prevent PHN. The shingles vaccine is recommended for people older than age 24. It may prevent shingles and may alsolower your risk of PHN if you do get shingles. Contact a health care provider if: Your medicine is not helping. You are struggling to manage your pain at home. Get help right away if: You have thoughts about hurting yourself or others. If you ever feel like you may hurt yourself or others, or have thoughts about taking your own life, get help right away. Go to your nearest emergency department or: Call your local emergency services (911 in the [place] Call a suicide crisis helpline, such as the [place] at 305-734-0993. This is open 24 hours a day in the [place] Text the Crisis Text Line at (224)663-0037 (in the [place].). Summary Postherpetic neuralgia (PHN) is a very painful disorder that can occur after an episode of shingles. The pain is often severe and may be described as stabbing, burning, shooting, or feeling like an electric shock. Prescription medicines can be helpful in managing persistent pain. Getting a vaccination for shingles can prevent PHN. This vaccine is recommended for people older than age 8. This information is not intended to replace advice given to you by your health care provider. Make sure you discuss any questions you have with your healthcare provider. Document Revised: 08/03/2020 Document Reviewed: 08/03/2020 Elsevier Patient Education  2022 [employer].

## 2021-04-13 MED ORDER — OXYCODONE-ACETAMINOPHEN 10-325 MG PO TABS: 1.0000 | ORAL_TABLET | Freq: Four times a day (QID) | ORAL | 0 refills | Status: DC | PRN

## 2021-04-13 NOTE — Telephone Encounter (Addendum)
Return Katherine Delgado call, she reports she was diagnosed with Shingles, she went to [place] on 03/31/2021, she reports increase intensity of pain and her lesions are weeping, Her appointment was changed to 05/07/2021, she verbalizes understanding.  PMP was reviewed, oxycodone e-scribed today.

## 2021-04-13 NOTE — Addendum Note (Signed)
Addended by: Bayard Hugger on: 04/13/2021 04:25 PM   Modules accepted: Orders

## 2021-04-13 NOTE — Telephone Encounter (Signed)
Katherine Delgado  has been dx with Shingles. The out-break is on her right shoulder, right arm and right hand. Does she needed to keep her Sept 9 appointment?   Patient would like to speak to [place]. (Call back phone # [phone]).

## 2021-04-14 NOTE — Progress Notes (Signed)
Office Visit Note  Patient: Katherine Delgado             Date of Birth: [birth year]           MRN: [id number]             PCP: Sandrea Hughs, NP Referring: Gayland Curry, DO Visit Date: 04/28/2021 Occupation: '@GUAROCC'$ @  Subjective:  Pain in right hand   History of Present Illness: Katherine Delgado is a 80 y.o. female with history of seropositive rheumatoid arthritis, Sjogren's syndrome, fibromyalgia, and osteoporosis.  Patient reports that she has been off of Xeljanz for the past 1 month due to requiring a refill.  She states that she was evaluated in the ED on 03/31/2021 and was diagnosed with shingles on her right arm.  She took 2 courses of Valtrex and has been applying topical Benadryl gel on a daily basis.  The vesicles have started to heal but she continues to have some itching of her skin.  She states that she has not had the shingles vaccination in the past.  She continues to have some residual burning sensation on her right arm.  She is also having a flare of rheumatoid arthritis in her right hand and wrist since she has been off of Xeljanz for several weeks.  She continues to have generalized myalgias and muscle tenderness due to underlying fibromyalgia.  On home she continues to use a wheelchair but occasionally will use her walker or cane to assist with ambulation.  She has not had any recent falls.  She states that she has been following up with pain management and has been taking oxycodone for pain relief.  She does not feel that her pain has been well managed. She has ongoing sicca symptoms, especially dry mouth.  She has been using eye drops PRN.    Activities of Daily Living:  Patient reports morning stiffness for all day. Patient Reports nocturnal pain.  Difficulty dressing/grooming: Reports Difficulty climbing stairs: Reports Difficulty getting out of chair: Reports Difficulty using hands for taps, buttons, cutlery, and/or writing: Denies  Review of Systems   Constitutional:  Positive for fatigue.  HENT:  Positive for mouth dryness. Negative for mouth sores and nose dryness.   Eyes:  Negative for pain, itching, visual disturbance and dryness.  Respiratory:  Positive for shortness of breath. Negative for cough, hemoptysis and difficulty breathing.   Cardiovascular:  Positive for palpitations. Negative for chest pain, hypertension and swelling in legs/feet.  Gastrointestinal:  Negative for blood in stool, constipation and diarrhea.  Endocrine: Negative for increased urination.  Genitourinary:  Positive for involuntary urination. Negative for difficulty urinating and painful urination.  Musculoskeletal:  Positive for joint pain, joint pain, joint swelling, myalgias, morning stiffness, muscle tenderness and myalgias. Negative for muscle weakness.  Skin:  Positive for rash. Negative for pallor, hair loss, nodules/bumps, skin tightness, ulcers and sensitivity to sunlight.  Allergic/Immunologic: Positive for susceptible to infections.  Neurological:  Positive for numbness and weakness. Negative for dizziness, headaches and memory loss.  Hematological:  Positive for bruising/bleeding tendency. Negative for swollen glands.  Psychiatric/Behavioral:  Negative for depressed mood, confusion and sleep disturbance. The patient is not nervous/anxious.    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/10/2020   Primary osteoarthritis of right knee 07/25/2019   Wound dehiscence 08/17/2018   Lumbar spinal stenosis 03/30/2018   Rheumatoid arthritis involving both wrists with positive rheumatoid  factor ([place]) 06/14/2016   High risk medication use 06/14/2016   Sjogren's syndrome ([place]) 06/14/2016   Primary osteoarthritis of both knees 06/14/2016   DDD (degenerative disc disease), lumbar 06/14/2016   Hypothyroidism 06/14/2016   Osteoporosis 06/14/2016   Abnormality of gait 04/19/2016   Type 2 diabetes  mellitus with diabetic polyneuropathy, with long-term current use of insulin ([place]) 11/27/2015   Diabetes mellitus without complication ([place]) 123456   Headache(784.0) 07/31/2013   Sinus infection 07/31/2013   Chronic right-sided low back pain with right-sided sciatica 03/14/2013   Insomnia 12/06/2012   Nausea alone 12/06/2012   Diarrhea 12/06/2012   Urinary incontinence, urge 12/06/2012   Lumbosacral root lesions, not elsewhere classified 11/27/2012   Diabetic polyneuropathy associated with type 2 diabetes mellitus ([place]) 11/27/2012   EDEMA 05/04/2010   YEAST INFECTION 05/03/2010   Hyperlipidemia 05/03/2010   DEPRESSION 05/03/2010   History of peripheral neuropathy 05/03/2010   Essential hypertension 05/03/2010   ALLERGIC RHINITIS 05/03/2010   PNEUMONIA 05/03/2010   COPD (chronic obstructive pulmonary disease) ([place]) 05/03/2010   GERD 05/03/2010   Fibromyalgia 05/03/2010   DYSPNEA 05/03/2010   CHEST PAIN 05/03/2010    Past Medical History:  Diagnosis Date   Abnormality of gait 04/19/2016   Acute infective polyneuritis ([place]) 2002   Allergic rhinitis due to pollen    Chronic pain syndrome    COPD (chronic obstructive pulmonary disease) (HCC)    chronic bronchitis   Depressive disorder, not elsewhere classified    Diabetes mellitus without complication (HCC)    Diaphragmatic hernia without mention of obstruction or gangrene    Dyslipidemia    Fibromyalgia    GERD (gastroesophageal reflux disease)    with h/o esophagitis   Guillain-Barre syndrome (HCC)    History of benign thymus tumor    Insomnia, unspecified    Lumbar spinal stenosis 03/30/2018   L4-5 level, severe   Miscarriage 1962   Mixed hyperlipidemia    Morbid obesity ([place])    Osteoporosis    Pneumonia    Polyneuropathy in diabetes(357.2)    Rheumatoid arthritis(714.0)    Spondylosis, lumbosacral    Spontaneous ecchymoses    Type II or unspecified type diabetes mellitus with peripheral circulatory disorders,  uncontrolled(250.72)    Unspecified chronic bronchitis (HCC)    Unspecified essential hypertension    Unspecified hypothyroidism    Unspecified pruritic disorder    Unspecified urinary incontinence     Family History  Problem Relation Age of Onset   Alzheimer's disease Mother    Heart disease Mother    Heart disease Father    Liver disease Father    Cancer Brother    Arthritis Son    Colon polyps Brother    Colon cancer Neg Hx    Esophageal cancer Neg Hx    Kidney disease Neg Hx    Stomach cancer Neg Hx    Rectal cancer Neg Hx    Past Surgical History:  Procedure Laterality Date   ABDOMINAL HYSTERECTOMY  1974   abdominal tumor  2002   APPENDECTOMY     APPLICATION OF A-CELL OF BACK N/A 09/26/2018   Procedure: With Acell;  Surgeon: Wallace Going, DO;  Location: [facility];  Service: Plastics;  Laterality: N/A;   APPLICATION OF WOUND VAC N/A 09/26/2018   Procedure: Vac placement;  Surgeon: Wallace Going, DO;  Location: [facility];  Service: Plastics;  Laterality: N/A;   BACK SURGERY  1982   BRONCHOSCOPY  2001   CATARACT EXTRACTION, BILATERAL  09/2019, 10/2019   CHOLECYSTECTOMY  1984   INCISION AND DRAINAGE OF WOUND N/A 09/26/2018   Procedure: Debridement of spine wound;  Surgeon: Wallace Going, DO;  Location: [facility];  Service: Plastics;  Laterality: N/A;   KNEE SURGERY Bilateral 08/27/2010 (L) and 01/11/2011 (R)   LUMBAR LAMINECTOMY/DECOMPRESSION MICRODISCECTOMY N/A 07/03/2018   Procedure: Lumbar Three to Lumbar Five Laminectomy;  Surgeon: Judith Part, MD;  Location: [facility];  Service: Neurosurgery;  Laterality: N/A;   LUMBAR WOUND DEBRIDEMENT N/A 08/20/2018   Procedure: POSTERIOR LUMBAR SPINAL WOUND DEBRIDEMENT AND REVISION;  Surgeon: Judith Part, MD;  Location: [facility];  Service: Neurosurgery;  Laterality: N/A;  POSTERIOR LUMBAR SPINAL WOUND REVISION   miscarrage  1962   OVARIAN CYST SURGERY  1968   thymus tumor     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   TONSILLECTOMY      Social History   Social History Narrative   Walks with cane   Right handed    Caffeine use: Coffee (2 cups every morning)   Tea: sometimes   Soda: none   Immunization History  Administered Date(s) Administered   Influenza-Unspecified 06/10/2011   PFIZER(Purple Top)SARS-COV-2 Vaccination 11/13/2019, 11/30/2019   Pneumococcal Conjugate-13 07/07/2016   Pneumococcal Polysaccharide-23 11/21/2003, 10/05/2017   Tdap 05/28/2016     Objective: Vital Signs: BP 129/66 (BP Location: Left Arm, Patient Position: Sitting, Cuff Size: Normal)   Pulse 78   Ht '5\' 6"'$  (1.676 m)   Wt 216 lb (98 kg) Comment: per patient  BMI 34.86 kg/m    Physical Exam Vitals and nursing note reviewed.  Constitutional:      Appearance: She is well-developed.  HENT:     Head: Normocephalic and atraumatic.  Eyes:     Conjunctiva/sclera: Conjunctivae normal.  Pulmonary:     Effort: Pulmonary effort is normal.  Abdominal:     Palpations: Abdomen is soft.  Musculoskeletal:     Cervical back: Normal range of motion.  Skin:    General: Skin is warm and dry.     Capillary Refill: Capillary refill takes less than 2 seconds.     Comments: Clustered erythematous macules noted on dorsal aspect of right forearm.    Neurological:     Mental Status: She is alert and oriented to person, place, and time.  Psychiatric:        Behavior: Behavior normal.     Musculoskeletal Exam: Patient remained in wheelchair during exam. C-spine has limited ROM with lateral rotation.  Trapezius muscle tension and tenderness bilaterally.  Shoulder joints have pain and stiffness with abduction.  Elbow joints have good ROM.  Tenderness and synovitis of the right wrist. Incomplete fist formation bilaterally, Right >left.  Hip joints difficult to assess while in wheelchair.  Knee joints have good ROM with some discomfort in the right knee.  No warmth or effusion of knee joints noted.  Ankle joints have good ROM with no tenderness. Some pedal  edema noted bilaterally.   CDAI Exam: CDAI Score: -- Patient Global: --; Provider Global: -- Swollen: --; Tender: -- Joint Exam 04/28/2021   No joint exam has been documented for this visit   There is currently no information documented on the homunculus. Go to the Rheumatology activity and complete the homunculus joint exam.  Investigation: No additional findings.  Imaging: DG Chest 2 View  Result Date: 03/31/2021 CLINICAL DATA:  Provided history: Right-sided pleuritic chest pain, evaluate for pneumonia versus pneumothorax. Additional history provided: Patient reports right-sided chest pain radiating  through to back and right arm, rash on right arm with swelling, shortness of breath. EXAM: CHEST - 2 VIEW COMPARISON:  Prior chest radiographs 09/10/2020. CT angiogram chest FINDINGS: Prior median sternotomy. Heart size within normal limits. Aortic atherosclerosis. Redemonstrated mild scarring within the lateral left lung base. No appreciable airspace consolidation or pulmonary edema. No evidence of pleural effusion or pneumothorax. No evidence of acute bony abnormality. Redemonstrated mild chronic superior endplate vertebral compression deformity within the upper lumbar spine. IMPRESSION: No evidence of acute cardiopulmonary abnormality. Redemonstrated mild scarring within the lateral left lung base. Aortic Atherosclerosis (ICD10-I70.0). Electronically Signed   By: Kellie Simmering DO   On: 03/31/2021 09:38   CT Angio Chest PE W and/or Wo Contrast  Result Date: 03/31/2021 CLINICAL DATA:  Chest pain. EXAM: CT ANGIOGRAPHY CHEST WITH CONTRAST TECHNIQUE: Multidetector CT imaging of the chest was performed using the standard protocol during bolus administration of intravenous contrast. Multiplanar CT image reconstructions and MIPs were obtained to evaluate the vascular anatomy. CONTRAST:  85m OMNIPAQUE IOHEXOL 350 MG/ML SOLN COMPARISON:  October 20, 2011. FINDINGS: Cardiovascular: Satisfactory  opacification of the pulmonary arteries to the segmental level. No evidence of pulmonary embolism. Normal heart size. No pericardial effusion. Atherosclerosis of thoracic aorta is noted without aneurysm or dissection. Mediastinum/Nodes: No enlarged mediastinal, hilar, or axillary lymph nodes. Thyroid gland, trachea, and esophagus demonstrate no significant findings. Lungs/Pleura: Lungs are clear. No pleural effusion or pneumothorax. Upper Abdomen: No acute abnormality. Musculoskeletal: No chest wall abnormality. No acute or significant osseous findings. Review of the MIP images confirms the above findings. IMPRESSION: No definite evidence of pulmonary embolus. No acute abnormality seen in the chest. Aortic Atherosclerosis (ICD10-I70.0). Electronically Signed   By: JMarijo ConceptionM.D.   On: 03/31/2021 12:56    Recent Labs: Lab Results  Component Value Date   WBC 10.5 03/31/2021   HGB 14.2 03/31/2021   PLT 258 03/31/2021   NA 136 03/31/2021   K 3.5 03/31/2021   CL 100 03/31/2021   CO2 24 03/31/2021   GLUCOSE 87 03/31/2021   BUN 15 03/31/2021   CREATININE 1.13 (H) 03/31/2021   BILITOT 0.4 02/12/2021   ALKPHOS 96 01/25/2021   AST 16 02/12/2021   ALT 12 02/12/2021   PROT 5.9 (L) 02/12/2021   ALBUMIN 3.5 01/25/2021   CALCIUM 9.5 03/31/2021   GFRAA 65 02/12/2021   QFTBGOLDPLUS NEGATIVE 06/11/2020    Speciality Comments: No specialty comments available.  Procedures:  No procedures performed Allergies: Influenza vaccines, Penicillins, Statins, and Sulfamethoxazole-trimethoprim   Assessment / Plan:     Visit Diagnoses: Rheumatoid arthritis involving multiple sites with positive rheumatoid factor ([facility]: She presents today with tenderness and synovitis of the right wrist and right 2nd and 3rd MCP joints.  She has been off of Xeljanz for the past 1 month due to requiring a refill.  On 03/31/2021 she was evaluated in the ED for a vesicular rash on her right arm at which time she was diagnosed with  herpes zoster.  No signs of disseminated herpes zoster at that time.  She took 2 courses of Valtrex at 1000 mg 3 times daily for 7 days x2.  She is also been applying Benadryl gel topically and keeping the rash covered with gauze to prevent her from scratching the lesions.  The lesions have completely healed but she does have some residual macular erythema on her right forearm.  We discussed the importance of keeping this area covered and clean to prevent a secondary  infection.  Discussed with Dr. Estanislado Pandy who also examined the patients rash.  Discussed that it is okay to resume Xeljanz 5 mg 1 tablet by mouth daily as prescribed.  If she develops any new or worsening symptoms she was advised to hold Xeljanz and to be evaluated further by her PCP.  She is not a good candidate for oral prednisone due to history of uncontrolled diabetes.  She was advised to notify us if her right wrist pain and swelling does not improve after restarting on Xeljanz.  She will follow-up in the office in 5 months.  High risk medication use - Xeljanz 5 mg 1 tablet by mouth daily. D/c Arava-SE of diarrhea, d/c MTX due to recurent infections.TB Gold negative on 06/11/2020.  Future order for TB gold will be placed today.  CBC and BMP updated on 03/31/2021.  She will continue to require lab work every 3 months to monitor for drug toxicity.  - Plan: QuantiFERON-TB Gold Plus Discussed the importance of holding Morrie Sheldon whenever she is experiencing signs or symptoms of an infection and to resume only once the infection has completely cleared. Strongly encouraged to receive the Shingrix vaccination in the future.  Sjogren's syndrome with keratoconjunctivitis sicca ([place]): She continues to have chronic sicca symptoms especially dry mouth.  She has been using eyedrops as needed for dry eyes.  Fibromyalgia: She has generalized hyperalgesia and positive tender points on examination.  She has been experiencing a fibromyalgia flare over the past  several weeks.  She continues to take Cymbalta and gabapentin as prescribed.  Primary osteoarthritis of both knees: She continues to have chronic pain in the right knee joint.  She has good range of motion in both knee joints on examination today with no warmth or effusion.  She is primarily using a wheelchair but at home she will occasionally use a cane or walker to assist with ambulation.  DDD (degenerative disc disease), lumbar: Chronic pain.  She has established care with pain management.  She has been taking Percocet 10 mg every 6 hours as needed for pain relief.  Age-related osteoporosis without current pathological fracture - DEXA ordered by PCP.  According to the patient she had a recent DEXA.  We will call to obtain these records.  She continues to take vitamin D 1000 units daily.  No recent falls.  Other insomnia: She has been experiencing increased nocturnal pain which has been contributing to her insomnia.  She takes melatonin 10 mg at bedtime for insomnia.  Herpes zoster without complication: She was evaluated in the ED on 03/31/2021 for a vesicular rash on her right arm.  She was diagnosed with herpes zoster and was not exhibiting any signs of disseminated zoster at that time.  She has been holding [place] for the past 1 month due to requiring a refill.  She completed a prescription for Valtrex 1000 mg 3 times daily for 7 days x2 and has continued to take gabapentin as prescribed.  She applied Benadryl gel topically and wrapped her arm with gauze to try to prevent herself from scratching the healing lesions.  On examination today there was no open vesicles.  She has some residual macular erythema on the dorsal aspect of her right forearm.  Dr. Estanislado Pandy also examined the patients arm today.  Since all of the vesicles have completely healed she can resume taking Xeljanz 5 mg 1 tablet daily as prescribed.  She was advised to avoid scratching the healed vesicles in order to try to prevent  a  secondary infection.  We discussed that if she develops a secondary infection she will have to hold Xeljanz until the infection has completely cleared. She was strongly encouraged to discuss receiving the Shingrix vaccination at her next appointment with her PCP.  We discussed the increased risk for developing herpes zoster while on Xeljanz.  Other medical conditions are listed as follows:  History of diabetes mellitus: She has not a good candidate for systemic prednisone use.  History of Guillain-Barre syndrome  History of COPD  History of gastroesophageal reflux (GERD)  History of hypertension  History of depression  History of peripheral neuropathy  Orders: Orders Placed This Encounter  Procedures   QuantiFERON-TB Gold Plus   Meds ordered this encounter  Medications   Tofacitinib Citrate (XELJANZ) 5 MG TABS    Sig: Take 1 tablet (5 mg total) by mouth daily.    Dispense:  90 tablet    Refill:  0     Follow-Up Instructions: Return in about 5 months (around 09/28/2021) for Rheumatoid arthritis, Sjogren's syndrome, Fibromyalgia, Osteoarthritis, DDD.   Ofilia Neas, PA-C  Note - This record has been created using Dragon software.  Chart creation errors have been sought, but may not always  have been located. Such creation errors do not reflect on  the standard of medical care.

## 2021-04-19 ENCOUNTER — Encounter: Payer: Medicare Other | Admitting: Registered Nurse

## 2021-04-23 ENCOUNTER — Telehealth: Payer: Self-pay

## 2021-04-23 NOTE — Telephone Encounter (Signed)
Patient called stating requesting prescription for Oxycodone/APAP. Called [facility] and there is a prescription on file that they will fill and call patient when its ready.

## 2021-04-27 ENCOUNTER — Ambulatory Visit: Payer: Medicare Other | Admitting: Registered Nurse

## 2021-04-27 ENCOUNTER — Encounter: Payer: Self-pay | Admitting: Physical Medicine and Rehabilitation

## 2021-04-27 ENCOUNTER — Encounter
Payer: Medicare Other | Attending: Physical Medicine and Rehabilitation | Admitting: Physical Medicine and Rehabilitation

## 2021-04-27 ENCOUNTER — Other Ambulatory Visit: Payer: Self-pay | Admitting: Family

## 2021-04-27 ENCOUNTER — Other Ambulatory Visit: Payer: Self-pay

## 2021-04-27 VITALS — BP 131/72 | HR 76 | Temp 98.3°F | Ht 66.0 in | Wt 216.6 lb

## 2021-04-27 DIAGNOSIS — R29898 Other symptoms and signs involving the musculoskeletal system: Secondary | ICD-10-CM | POA: Diagnosis not present

## 2021-04-27 DIAGNOSIS — G894 Chronic pain syndrome: Secondary | ICD-10-CM | POA: Diagnosis not present

## 2021-04-27 DIAGNOSIS — M961 Postlaminectomy syndrome, not elsewhere classified: Secondary | ICD-10-CM | POA: Insufficient documentation

## 2021-04-27 MED ORDER — DULOXETINE HCL 20 MG PO CPEP: 20.0000 mg | ORAL_CAPSULE | Freq: Every day | ORAL | 3 refills | Status: DC

## 2021-04-27 MED ORDER — AMLODIPINE BESYLATE 10 MG PO TABS: ORAL_TABLET | ORAL | 1 refills | Status: DC

## 2021-04-27 NOTE — Patient Instructions (Signed)
-  Discussed current symptoms of pain and history of pain.  -Discussed benefits of exercise in reducing pain. -Discussed following foods that may reduce pain: 1) Ginger (especially studied for arthritis)- reduce leukotriene production to decrease inflammation 2) Blueberries- high in phytonutrients that decrease inflammation 3) Salmon- marine omega-3s reduce joint swelling and pain 4) Pumpkin seeds- reduce inflammation 5) dark chocolate- reduces inflammation 6) turmeric- reduces inflammation 7) tart cherries - reduce pain and stiffness 8) extra virgin olive oil - its compound olecanthal helps to block prostaglandins  9) chili peppers- can be eaten or applied topically via capsaicin 10) mint- helpful for headache, muscle aches, joint pain, and itching 11) garlic- reduces inflammation  Link to further information on diet for chronic pain: [email or web address]

## 2021-04-27 NOTE — Telephone Encounter (Signed)
Received refill Request from [facility]

## 2021-04-27 NOTE — Progress Notes (Signed)
Subjective:    Patient ID: Katherine Delgado, female    DOB: 09/19/1940, 80 y.o.   MRN: [id number]  HPI: Katherine Delgado is a 80 y.o. female who returns for follow up appointment for chronic pain and medication refill. She states her pain is located in her bilateral shoulders, lower back pain radiating into his left hip. Her current exercise regime is receiving PT/OT two days a week.  Ms. Goshert Morphine equivalent is 60.00  MME.     She was hospitalized with pneumonia since I saw her last. She was also told she had shingles, and she is toward the end of it now. Her fingers still have decreased strength on her right side- she does note improvements. She cannot get her right thumb to elevated. She will be seeing Dr. Estanislado Pandy. She has been unable to write.   She has been using cranberry for her incontinence.    Pain Inventory Average Pain 9 Pain Right Now 10 My pain is sharp, burning, dull, stabbing, tingling, and aching  In the last 24 hours, has pain interfered with the following? General activity 10 Relation with others 10 Enjoyment of life 10 What TIME of day is your pain at its worst? morning , evening, and night Sleep (in general) Poor  Pain is worse with: walking, bending, sitting, standing, and some activites Pain improves with: rest and medication Relief from Meds: 2  Family History  Problem Relation Age of Onset   Alzheimer's disease Mother    Heart disease Mother    Heart disease Father    Liver disease Father    Cancer Brother    Arthritis Son    Colon polyps Brother    Colon cancer Neg Hx    Esophageal cancer Neg Hx    Kidney disease Neg Hx    Stomach cancer Neg Hx    Rectal cancer Neg Hx    Social History   Socioeconomic History   Marital status: Widowed    Spouse name: Not on file   Number of children: 2   Years of education: 14   Highest education level: Not on file  Occupational History   Occupation: Retired  Tobacco Use   Smoking status: Former     Packs/day: 0.10    Years: 10.00    Pack years: 1.00    Types: Cigarettes    Quit date: 12/07/1979    Years since quitting: 41.4   Smokeless tobacco: Never  Vaping Use   Vaping Use: Never used  Substance and Sexual Activity   Alcohol use: No    Alcohol/week: 0.0 standard drinks   Drug use: No   Sexual activity: Not Currently  Other Topics Concern   Not on file  Social History Narrative   Walks with cane   Right handed    Caffeine use: Coffee (2 cups every morning)   Tea: sometimes   Soda: none   Social Determinants of Health   Financial Resource Strain: Not on file  Food Insecurity: Not on file  Transportation Needs: Not on file  Physical Activity: Not on file  Stress: Not on file  Social Connections: Not on file   Past Surgical History:  Procedure Laterality Date   ABDOMINAL HYSTERECTOMY  1974   abdominal tumor  2002   APPENDECTOMY     APPLICATION OF A-CELL OF BACK N/A 09/26/2018   Procedure: With Acell;  Surgeon: Wallace Going, DO;  Location: [place];  Service: Plastics;  Laterality: N/A;  APPLICATION OF WOUND VAC N/A 09/26/2018   Procedure: Vac placement;  Surgeon: Wallace Going, DO;  Location: [place];  Service: Plastics;  Laterality: N/A;   BACK SURGERY  1982   BRONCHOSCOPY  2001   CATARACT EXTRACTION, BILATERAL  09/2019, 10/2019   CHOLECYSTECTOMY  1984   INCISION AND DRAINAGE OF WOUND N/A 09/26/2018   Procedure: Debridement of spine wound;  Surgeon: Wallace Going, DO;  Location: [place];  Service: Plastics;  Laterality: N/A;   KNEE SURGERY Bilateral 08/27/2010 (L) and 01/11/2011 (R)   LUMBAR LAMINECTOMY/DECOMPRESSION MICRODISCECTOMY N/A 07/03/2018   Procedure: Lumbar Three to Lumbar Five Laminectomy;  Surgeon: Judith Part, MD;  Location: [place];  Service: Neurosurgery;  Laterality: N/A;   LUMBAR WOUND DEBRIDEMENT N/A 08/20/2018   Procedure: POSTERIOR LUMBAR SPINAL WOUND DEBRIDEMENT AND REVISION;  Surgeon: Judith Part, MD;  Location: [place];   Service: Neurosurgery;  Laterality: N/A;  POSTERIOR LUMBAR SPINAL WOUND REVISION   miscarrage  1962   OVARIAN CYST SURGERY  1968   thymus tumor     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   TONSILLECTOMY     Past Surgical History:  Procedure Laterality Date   ABDOMINAL HYSTERECTOMY  1974   abdominal tumor  2002   APPENDECTOMY     APPLICATION OF A-CELL OF BACK N/A 09/26/2018   Procedure: With Acell;  Surgeon: Wallace Going, DO;  Location: [place];  Service: Plastics;  Laterality: N/A;   APPLICATION OF WOUND VAC N/A 09/26/2018   Procedure: Vac placement;  Surgeon: Wallace Going, DO;  Location: [place];  Service: Plastics;  Laterality: N/A;   BACK SURGERY  1982   BRONCHOSCOPY  2001   CATARACT EXTRACTION, BILATERAL  09/2019, 10/2019   CHOLECYSTECTOMY  1984   INCISION AND DRAINAGE OF WOUND N/A 09/26/2018   Procedure: Debridement of spine wound;  Surgeon: Wallace Going, DO;  Location: [place];  Service: Plastics;  Laterality: N/A;   KNEE SURGERY Bilateral 08/27/2010 (L) and 01/11/2011 (R)   LUMBAR LAMINECTOMY/DECOMPRESSION MICRODISCECTOMY N/A 07/03/2018   Procedure: Lumbar Three to Lumbar Five Laminectomy;  Surgeon: Judith Part, MD;  Location: [place];  Service: Neurosurgery;  Laterality: N/A;   LUMBAR WOUND DEBRIDEMENT N/A 08/20/2018   Procedure: POSTERIOR LUMBAR SPINAL WOUND DEBRIDEMENT AND REVISION;  Surgeon: Judith Part, MD;  Location: [place];  Service: Neurosurgery;  Laterality: N/A;  POSTERIOR LUMBAR SPINAL WOUND REVISION   miscarrage  1962   OVARIAN CYST SURGERY  1968   thymus tumor     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   TONSILLECTOMY     Past Medical History:  Diagnosis Date   Abnormality of gait 04/19/2016   Acute infective polyneuritis ([place]) 2002   Allergic rhinitis due to pollen    Chronic pain syndrome    COPD (chronic obstructive pulmonary disease) (HCC)    chronic bronchitis   Depressive disorder, not elsewhere classified    Diabetes mellitus without complication ([place])     Diaphragmatic hernia without mention of obstruction or gangrene    Dyslipidemia    Fibromyalgia    GERD (gastroesophageal reflux disease)    with h/o esophagitis   Guillain-Barre syndrome (HCC)    History of benign thymus tumor    Insomnia, unspecified    Lumbar spinal stenosis 03/30/2018   L4-5 level, severe   Miscarriage 1962   Mixed hyperlipidemia    Morbid obesity ([place])    Osteoporosis    Pneumonia    Polyneuropathy in diabetes(357.2)  Rheumatoid arthritis(714.0)    Spondylosis, lumbosacral    Spontaneous ecchymoses    Type II or unspecified type diabetes mellitus with peripheral circulatory disorders, uncontrolled(250.72)    Unspecified chronic bronchitis (HCC)    Unspecified essential hypertension    Unspecified hypothyroidism    Unspecified pruritic disorder    Unspecified urinary incontinence    BP 131/72   Pulse 76   Temp 98.3 F (36.8 C)   Ht '5\' 6"'$  (1.676 m) Comment: last recorded  Wt 216 lb 9.6 oz (98.2 kg) Comment: last recorded  BMI 34.96 kg/m   Opioid Risk Score:   Fall Risk Score:  `1  Depression screen PHQ 2/9  Depression screen [facility] 2/9 03/11/2021 02/10/2021 01/19/2021 10/15/2020 10/01/2020 07/23/2020 06/19/2020  Decreased Interest 0 0 0 0 0 0 0  Down, Depressed, Hopeless 0 0 0 0 0 0 0  PHQ - 2 Score 0 0 0 0 0 0 0  Altered sleeping - - - - - - -  Tired, decreased energy - - - - - - -  Change in appetite - - - - - - -  Feeling bad or failure about yourself  - - - - - - -  Trouble concentrating - - - - - - -  Moving slowly or fidgety/restless - - - - - - -  Suicidal thoughts - - - - - - -  PHQ-9 Score - - - - - - -  Difficult doing work/chores - - - - - - -  Some recent data might be hidden       Review of Systems  Constitutional: Negative.   HENT: Negative.    Eyes: Negative.   Respiratory: Negative.    Cardiovascular: Negative.   Gastrointestinal: Negative.   Endocrine: Negative.   Genitourinary: Negative.   Musculoskeletal:  Positive for  back pain, gait problem and neck pain.       Pain in noth legs, pain  in right shoulder , pain in both knees , pain in both feet  Skin: Negative.   Allergic/Immunologic: Negative.   Hematological: Negative.   Psychiatric/Behavioral: Negative.        Objective:   Physical Exam Vitals and nursing note reviewed.  Constitutional:      Appearance: Normal appearance. Hard of hearing Cardiovascular:     Rate and Rhythm: Normal rate and regular rhythm.     Pulses: Normal pulses.     Heart sounds: Normal heart sounds.  Pulmonary:     Effort: Pulmonary effort is normal.     Breath sounds: Normal breath sounds.  Musculoskeletal:     Cervical back: Normal range of motion and neck supple.     Comments: Normal Muscle Bulk and Muscle Testing Reveals:  Upper Extremities: Full ROM and Muscle Strength 5/5 Bilateral AC Joint Tenderness Lumbar Paraspinal Tenderness: L-3-L-5 Left Greater Trochanter Tenderness Lower Extremities: Full ROM and Muscle Strength 5/5 Arrived in wheelchair     Skin:    General: Skin is warm and dry.  Neurological:     Mental Status: She is alert and oriented to person, place, and time.  Psychiatric:        Mood and Affect: Mood normal.        Behavior: Behavior normal.        Assessment & Plan:  Failed Back Syndrome: Continue HEP as Tolerated. Continue to Monitor. She is receiving Home Health therapy at this time, PT/OT two days a week. 03/11/2021 Lumbar Radiculitis: Continue Gabapentin. Continue to monitor.  Continue HEP as Tolerated.03/11/2021 Left  Greater Trochanter Bursitis: Scheduled for left hip injection with Dr Ranell Patrick.Continue to Alternate Ice and Heat Therapy. Continue current medication regimen. Continue to monitor. 03/11/2021 Bilateral Primary OA: Continue HEP as Tolerated. Continue to Monitor. 03/11/2021 Insomnia: Continue Amitriptyline . Continue to Monitor.03/11/2021. Discussed trying tart cherry juice Chronic Pain Syndrome: Refilled: Oxycodone  10/325 mg one tablet every 6 hours as needed for pain #120. We will continue the opioid monitoring program, this consists of regular clinic visits, examinations, urine drug screen, pill counts as well as use of [place] Controlled Substance Reporting system. Discussed the differences between oxycodone and oxycontin. Provided with a pain relief journal  -Discussed current symptoms of pain and history of pain.  -Discussed benefits of exercise in reducing pain. -Discussed following foods that may reduce pain: 1) Ginger (especially studied for arthritis)- reduce leukotriene production to decrease inflammation 2) Blueberries- high in phytonutrients that decrease inflammation 3) Salmon- marine omega-3s reduce joint swelling and pain 4) Pumpkin seeds- reduce inflammation 5) dark chocolate- reduces inflammation 6) turmeric- reduces inflammation 7) tart cherries - reduce pain and stiffness 8) extra virgin olive oil - its compound olecanthal helps to block prostaglandins  9) chili peppers- can be eaten or applied topically via capsaicin 10) mint- helpful for headache, muscle aches, joint pain, and itching 11) garlic- reduces inflammation  Link to further information on diet for chronic pain: [email or web address]   Right hand decreased strength post-shingles: provided referral for occupational therapy    F/U in 1 month

## 2021-04-28 ENCOUNTER — Ambulatory Visit: Payer: Medicare Other | Admitting: Physician Assistant

## 2021-04-28 ENCOUNTER — Encounter: Payer: Self-pay | Admitting: Physician Assistant

## 2021-04-28 ENCOUNTER — Other Ambulatory Visit (INDEPENDENT_AMBULATORY_CARE_PROVIDER_SITE_OTHER): Payer: Medicare Other | Admitting: Family

## 2021-04-28 ENCOUNTER — Ambulatory Visit: Payer: Medicare Other

## 2021-04-28 VITALS — BP 129/66 | HR 78 | Ht 66.0 in | Wt 216.0 lb

## 2021-04-28 DIAGNOSIS — G4709 Other insomnia: Secondary | ICD-10-CM

## 2021-04-28 DIAGNOSIS — B029 Zoster without complications: Secondary | ICD-10-CM | POA: Diagnosis not present

## 2021-04-28 DIAGNOSIS — R399 Unspecified symptoms and signs involving the genitourinary system: Secondary | ICD-10-CM | POA: Diagnosis not present

## 2021-04-28 DIAGNOSIS — Z79899 Other long term (current) drug therapy: Secondary | ICD-10-CM

## 2021-04-28 DIAGNOSIS — Z8709 Personal history of other diseases of the respiratory system: Secondary | ICD-10-CM

## 2021-04-28 DIAGNOSIS — M170 Bilateral primary osteoarthritis of knee: Secondary | ICD-10-CM | POA: Diagnosis not present

## 2021-04-28 DIAGNOSIS — M5136 Other intervertebral disc degeneration, lumbar region: Secondary | ICD-10-CM | POA: Diagnosis not present

## 2021-04-28 DIAGNOSIS — Z8659 Personal history of other mental and behavioral disorders: Secondary | ICD-10-CM

## 2021-04-28 DIAGNOSIS — Z8719 Personal history of other diseases of the digestive system: Secondary | ICD-10-CM

## 2021-04-28 DIAGNOSIS — M3501 Sicca syndrome with keratoconjunctivitis: Secondary | ICD-10-CM

## 2021-04-28 DIAGNOSIS — M0579 Rheumatoid arthritis with rheumatoid factor of multiple sites without organ or systems involvement: Secondary | ICD-10-CM

## 2021-04-28 DIAGNOSIS — Z8679 Personal history of other diseases of the circulatory system: Secondary | ICD-10-CM

## 2021-04-28 DIAGNOSIS — M797 Fibromyalgia: Secondary | ICD-10-CM | POA: Diagnosis not present

## 2021-04-28 DIAGNOSIS — Z8639 Personal history of other endocrine, nutritional and metabolic disease: Secondary | ICD-10-CM | POA: Diagnosis not present

## 2021-04-28 DIAGNOSIS — M810 Age-related osteoporosis without current pathological fracture: Secondary | ICD-10-CM | POA: Diagnosis not present

## 2021-04-28 DIAGNOSIS — R350 Frequency of micturition: Secondary | ICD-10-CM

## 2021-04-28 DIAGNOSIS — Z8669 Personal history of other diseases of the nervous system and sense organs: Secondary | ICD-10-CM

## 2021-04-28 DIAGNOSIS — M51369 Other intervertebral disc degeneration, lumbar region without mention of lumbar back pain or lower extremity pain: Secondary | ICD-10-CM

## 2021-04-28 LAB — POCT URINALYSIS DIPSTICK
Bilirubin, UA: NEGATIVE
Glucose, UA: NEGATIVE
Ketones, UA: 5
Nitrite, UA: POSITIVE
Protein, UA: POSITIVE — AB
Spec Grav, UA: 1.01 (ref 1.010–1.025)
Urobilinogen, UA: 1 E.U./dL
pH, UA: 7 (ref 5.0–8.0)

## 2021-04-28 MED ORDER — XELJANZ 5 MG PO TABS: 1.0000 | ORAL_TABLET | Freq: Every day | ORAL | 0 refills | Status: DC

## 2021-04-28 NOTE — Addendum Note (Signed)
Addended by: Elenor Legato E on: 04/28/2021 03:02 PM   Modules accepted: Orders

## 2021-04-28 NOTE — Patient Instructions (Signed)

## 2021-04-29 ENCOUNTER — Encounter: Payer: Self-pay | Admitting: Internal Medicine

## 2021-04-29 ENCOUNTER — Other Ambulatory Visit: Payer: Self-pay

## 2021-04-29 ENCOUNTER — Ambulatory Visit (INDEPENDENT_AMBULATORY_CARE_PROVIDER_SITE_OTHER): Payer: Medicare Other | Admitting: Internal Medicine

## 2021-04-29 VITALS — BP 140/60 | HR 69 | Ht 66.0 in | Wt 215.8 lb

## 2021-04-29 DIAGNOSIS — E1142 Type 2 diabetes mellitus with diabetic polyneuropathy: Secondary | ICD-10-CM

## 2021-04-29 DIAGNOSIS — Z794 Long term (current) use of insulin: Secondary | ICD-10-CM | POA: Diagnosis not present

## 2021-04-29 DIAGNOSIS — R531 Weakness: Secondary | ICD-10-CM | POA: Diagnosis not present

## 2021-04-29 DIAGNOSIS — E039 Hypothyroidism, unspecified: Secondary | ICD-10-CM | POA: Diagnosis not present

## 2021-04-29 DIAGNOSIS — M4801 Spinal stenosis, occipito-atlanto-axial region: Secondary | ICD-10-CM | POA: Diagnosis not present

## 2021-04-29 LAB — POCT GLYCOSYLATED HEMOGLOBIN (HGB A1C): Hemoglobin A1C: 5.9 % — AB (ref 4.0–5.6)

## 2021-04-29 MED ORDER — VICTOZA 18 MG/3ML ~~LOC~~ SOPN: PEN_INJECTOR | SUBCUTANEOUS | 3 refills | Status: DC

## 2021-04-29 MED ORDER — TOUJEO SOLOSTAR 300 UNIT/ML ~~LOC~~ SOPN: 90.0000 [IU] | PEN_INJECTOR | Freq: Every day | SUBCUTANEOUS | 3 refills | Status: DC

## 2021-04-29 NOTE — Patient Instructions (Addendum)
-   keep Up the Good Work ! - Decrease  Toujeo to 90 units daily  - Decrease  Humalog to 10  units with each meal  - Continue Victoza 1.8 mg daily   - Humalog correctional insulin: ADD extra units on insulin to your meal-time humalog dose if your blood sugars are higher than 170. Use the scale below to help guide you:   Blood sugar before meal Number of units to inject  Less than 170 0 unit  171 - 200 1 unit  201 - 230 2 units   231 - 260 3 units   261 - 290 4 units   291 - 320 5 units   321 - 350 6 units          HOW TO TREAT LOW BLOOD SUGARS (Blood sugar LESS THAN 70 MG/DL) Please follow the RULE OF 15 for the treatment of hypoglycemia treatment (when your (blood sugars are less than 70 mg/dL)   STEP 1: Take 15 grams of carbohydrates when your blood sugar is low, which includes:  3-4 GLUCOSE TABS  OR 3-4 OZ OF JUICE OR REGULAR SODA OR ONE TUBE OF GLUCOSE GEL    STEP 2: RECHECK blood sugar in 15 MINUTES STEP 3: If your blood sugar is still low at the 15 minute recheck --> then, go back to STEP 1 and treat AGAIN with another 15 grams of carbohydrates.

## 2021-04-29 NOTE — Progress Notes (Signed)
Name: Katherine Delgado  Age/ Sex: 80 y.o., female   MRN/ DOB: [id number], 01-24-1941     PCP: Sandrea Hughs, NP   Reason for Endocrinology Evaluation: Type 2 Diabetes Mellitus/ hypothyriodism  Initial Endocrine Consultative Visit: 10/14/2020    PATIENT IDENTIFIER: Katherine Delgado is a 80 y.o. female with a past medical history of HTN, DM, RA, failed back syndrome and Hypothyroidism.. The patient has followed with Endocrinology clinic since 10/14/2020 for consultative assistance with management of her diabetes.  DIABETIC HISTORY:  Katherine Delgado was diagnosed with DM many years ago. Her hemoglobin A1c has ranged from Her A1c was ranged from 6.6% in 2019 to 10.3 % in 2022.     On her initial visit to our clinic her A1c was 10.3% .We continued MDI regimen , victoza and started [place] and was provided with a correction scale . A dexcom prescription was sent   Wilder Glade was discontinued due to severe genital irritation    THYROID HISTORY: She has been on LT-4 replacement for years. Was noted to have a consistently low TSH since 05/2020. Per pt has been diagnosed with MNG in the past. No prior sx or biopsies. She was  On Levothyroxine 125 mcg daily which we reduced to 112 mcg daily   SUBJECTIVE:   During the last visit (/2022): A1c 8.8 %, we adjusted MDI regimen and continued Victoza .   Today (04/29/2021): Katherine Delgado is here for a follow up on diabetes and hypothyroidism . She is accompanied by her son today. She checks her blood sugars multiple times a day through CGM. The patient has had hypoglycemic episodes since the last clinic visit, this is noted during the day following a bolus , she is symptomatic.    Saw rheumatology yesterday for RA Had shingles attack 03/2021- treated through ED  Denies nausea or diarrhea     HOME ENDOCRINE REGIMEN:  Toujeo 100 units daily  Humalog 12 units with each meal  Victoza 1.8 mg daily   CF: Humalog ( BG- 130/20) starting at 171       CONTINUOUS GLUCOSE MONITORING RECORD INTERPRETATION    Dates of Recording: 8/26-04/29/2021  Sensor description:dexcom  Results statistics:   CGM use % of time 79  Average and SD 154/39  Time in range     77   %  % Time Above 180 20  % Time above 250 2  % Time Below target < 1      Glycemic patterns summary: optimal most of the day and night   Hyperglycemic episodes  postprandial   Hypoglycemic episodes occurred during the day  Overnight periods: stable but tight      DIABETIC COMPLICATIONS: Microvascular complications:  neuropathy Denies: CKD, retinopathy Last Eye Exam: Completed 03/25/2021  Macrovascular complications:   Denies: CAD, CVA, PVD   HISTORY:  Past Medical History:  Past Medical History:  Diagnosis Date   Abnormality of gait 04/19/2016   Acute infective polyneuritis ([place]) 2002   Allergic rhinitis due to pollen    Chronic pain syndrome    COPD (chronic obstructive pulmonary disease) (HCC)    chronic bronchitis   Depressive disorder, not elsewhere classified    Diabetes mellitus without complication ([place])    Diaphragmatic hernia without mention of obstruction or gangrene    Dyslipidemia    Fibromyalgia    GERD (gastroesophageal reflux disease)    with h/o esophagitis   Guillain-Barre syndrome ([place])    History of benign thymus tumor  Insomnia, unspecified    Lumbar spinal stenosis 03/30/2018   L4-5 level, severe   Miscarriage 1962   Mixed hyperlipidemia    Morbid obesity ([place])    Osteoporosis    Pneumonia    Polyneuropathy in diabetes(357.2)    Rheumatoid arthritis(714.0)    Spondylosis, lumbosacral    Spontaneous ecchymoses    Type II or unspecified type diabetes mellitus with peripheral circulatory disorders, uncontrolled(250.72)    Unspecified chronic bronchitis (HCC)    Unspecified essential hypertension    Unspecified hypothyroidism    Unspecified pruritic disorder    Unspecified urinary incontinence    Past Surgical  History:  Past Surgical History:  Procedure Laterality Date   ABDOMINAL HYSTERECTOMY  1974   abdominal tumor  2002   APPENDECTOMY     APPLICATION OF A-CELL OF BACK N/A 09/26/2018   Procedure: With Acell;  Surgeon: Wallace Going, DO;  Location: [facility];  Service: Plastics;  Laterality: N/A;   APPLICATION OF WOUND VAC N/A 09/26/2018   Procedure: Vac placement;  Surgeon: Wallace Going, DO;  Location: [facility];  Service: Plastics;  Laterality: N/A;   BACK SURGERY  1982   BRONCHOSCOPY  2001   CATARACT EXTRACTION, BILATERAL  09/2019, 10/2019   CHOLECYSTECTOMY  1984   INCISION AND DRAINAGE OF WOUND N/A 09/26/2018   Procedure: Debridement of spine wound;  Surgeon: Wallace Going, DO;  Location: [facility];  Service: Plastics;  Laterality: N/A;   KNEE SURGERY Bilateral 08/27/2010 (L) and 01/11/2011 (R)   LUMBAR LAMINECTOMY/DECOMPRESSION MICRODISCECTOMY N/A 07/03/2018   Procedure: Lumbar Three to Lumbar Five Laminectomy;  Surgeon: Judith Part, MD;  Location: [facility];  Service: Neurosurgery;  Laterality: N/A;   LUMBAR WOUND DEBRIDEMENT N/A 08/20/2018   Procedure: POSTERIOR LUMBAR SPINAL WOUND DEBRIDEMENT AND REVISION;  Surgeon: Judith Part, MD;  Location: [facility];  Service: Neurosurgery;  Laterality: N/A;  POSTERIOR LUMBAR SPINAL WOUND REVISION   miscarrage  1962   OVARIAN CYST SURGERY  1968   thymus tumor     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   TONSILLECTOMY     Social History:  reports that she quit smoking about 41 years ago. Her smoking use included cigarettes. She has a 1.00 pack-year smoking history. She has never used smokeless tobacco. She reports that she does not drink alcohol and does not use drugs. Family History:  Family History  Problem Relation Age of Onset   Alzheimer's disease Mother    Heart disease Mother    Heart disease Father    Liver disease Father    Cancer Brother    Arthritis Son    Colon polyps Brother    Colon cancer Neg Hx    Esophageal cancer Neg Hx     Kidney disease Neg Hx    Stomach cancer Neg Hx    Rectal cancer Neg Hx      HOME MEDICATIONS: Allergies as of 04/29/2021       Reactions   Influenza Vaccines Other (See Comments)   "h/o Guillain Barre; dr's told me years ago never to take another flu shot as it could relapse Rosalee Kaufman; has to do with when vaccine being changed to H1N1 virus"   Penicillins Hives, Itching, Swelling, Rash   Has patient had a PCN reaction causing immediate rash, facial/tongue/throat swelling, SOB or lightheadedness with hypotension:Unknown Has patient had a PCN reaction causing severe rash involving mucus membranes or skin necrosis: Unknown Has patient had a PCN reaction that required hospitalization:Unknown Has patient  had a PCN reaction occurring within the last 10 years: Unknown If all of the above answers are "NO", then may proceed with Cephalosporin use.   Statins Other (See Comments)   Myopathy, transaminitis   Sulfamethoxazole-trimethoprim Itching        Medication List        Accurate as of April 29, 2021  8:40 AM. If you have any questions, ask your nurse or doctor.          acetaminophen 325 MG tablet Commonly known as: TYLENOL Take 650 mg by mouth every 6 (six) hours as needed (for pain.).   amitriptyline 25 MG tablet Commonly known as: ELAVIL TAKE 1 TABLET(25 MG) BY MOUTH AT BEDTIME   amLODipine 10 MG tablet Commonly known as: NORVASC TAKE 1 TABLET(10 MG) BY MOUTH DAILY   aspirin EC 81 MG tablet Take 81 mg by mouth 2 (two) times a week.   B-D UF III MINI PEN NEEDLES 31G X 5 MM Misc Generic drug: Insulin Pen Needle USE AS DIRECTED   calamine lotion Apply 1 application topically 3 (three) times daily. To affected areas on right fore arm   cholecalciferol 25 MCG (1000 UNIT) tablet Commonly known as: VITAMIN D3 Take 1,000 Units by mouth daily.   Cranberry 475 MG Caps Take 1 capsule (475 mg total) by mouth 2 (two) times daily.   docusate sodium 100 MG  capsule Commonly known as: COLACE Take 100 mg by mouth daily as needed for mild constipation.   DULoxetine 20 MG capsule Commonly known as: Cymbalta Take 1 capsule (20 mg total) by mouth daily.   ezetimibe 10 MG tablet Commonly known as: ZETIA Take 1 tablet (10 mg total) by mouth daily.   fluconazole 150 MG tablet Commonly known as: DIFLUCAN Take 150 mg by mouth once.   folic acid 1 MG tablet Commonly known as: FOLVITE TAKE 2 TABLETS BY MOUTH  DAILY   furosemide 20 MG tablet Commonly known as: LASIX TAKE 1 TABLET BY MOUTH TWICE DAILY AS NEEDED FOR 3 POUND WEIGHT GAIN IN ONE DAY OR 5 POUNDS IN ONE WEEK   gabapentin 600 MG tablet Commonly known as: NEURONTIN Take 600 mg by mouth 2 (two) times daily.   HumaLOG KwikPen 200 UNIT/ML KwikPen Generic drug: insulin lispro Max daily 80 units   ICY HOT MEDICATED SPRAY EX Apply 1 application topically as needed (shoulder pain).   ipratropium-albuterol 0.5-2.5 (3) MG/3ML Soln Commonly known as: DUONEB Take 3 mLs by nebulization every 6 (six) hours as needed.   levothyroxine 112 MCG tablet Commonly known as: SYNTHROID Take 1 tablet (112 mcg total) by mouth daily.   lidocaine 5 % Commonly known as: LIDODERM Place 1 patch onto the skin daily. Remove & Discard patch within 12 hours or as directed by MD   lisinopril 20 MG tablet Commonly known as: ZESTRIL Take one tablet by mouth once daily.   loperamide 2 MG tablet Commonly known as: IMODIUM A-D Take 2 mg by mouth 4 (four) times daily as needed for diarrhea or loose stools.   Melatonin 10 MG Tabs Take 10 mg by mouth at bedtime as needed (sleep).   metoprolol tartrate 50 MG tablet Commonly known as: LOPRESSOR TAKE 1 TABLET BY MOUTH TWICE DAILY   mirabegron ER 50 MG Tb24 tablet Commonly known as: Myrbetriq Take 1 tablet (50 mg total) by mouth daily.   MULTIVITAMIN ADULT PO Take 1 tablet by mouth daily.   nystatin powder Commonly known as: nystatin APPLY TOPICALLY  THREE TIMES DAILY AS DIRECTED FOR 14 DAYS and prn yeast infection   nystatin cream Commonly known as: MYCOSTATIN Apply 1 application topically 3 (three) times daily. To skin folds   omega-3 acid ethyl esters 1 g capsule Commonly known as: LOVAZA TAKE ONE CAPSULE BY MOUTH EVERY DAY   oxyCODONE-acetaminophen 10-325 MG tablet Commonly known as: Percocet Take 1 tablet by mouth every 6 (six) hours as needed for pain.   polyvinyl alcohol 1.4 % ophthalmic solution Commonly known as: LIQUIFILM TEARS Place 1 drop into both eyes 4 (four) times daily as needed (for dry/irritated eyes).   potassium chloride 10 MEQ CR capsule Commonly known as: MICRO-K Take 2 capsules (20 mEq total) by mouth 2 (two) times daily as needed (when taking a dose of lasix.).   sodium chloride 0.65 % nasal spray Commonly known as: OCEAN Place 2 sprays into the nose as needed for congestion.   tiotropium 18 MCG inhalation capsule Commonly known as: SPIRIVA Place 18 mcg into inhaler and inhale as needed.   tiZANidine 2 MG tablet Commonly known as: ZANAFLEX TAKE 1 TABLET(2 MG) BY MOUTH THREE TIMES DAILY   Toujeo SoloStar 300 UNIT/ML Solostar Pen Generic drug: insulin glargine (1 Unit Dial) INJECT 100 UNITS INTO THE SKIN EVERY NIGHT AT BEDTIME   triamterene-hydrochlorothiazide 37.5-25 MG tablet Commonly known as: MAXZIDE-25 Take 1 tablet by mouth daily.   valACYclovir 1000 MG tablet Commonly known as: VALTREX TAKE 1 TABLET(1000 MG) BY MOUTH THREE TIMES DAILY FOR 7 DAYS   venlafaxine XR 75 MG 24 hr capsule Commonly known as: EFFEXOR-XR TAKE 1 CAPSULE BY MOUTH EVERY DAY WITH BREAKFAST   Victoza 18 MG/3ML Sopn Generic drug: liraglutide Administer 1.'8mg'$  under the skin daily for blood sugar.   Xeljanz 5 MG Tabs Generic drug: Tofacitinib Citrate Take 1 tablet (5 mg total) by mouth daily.         OBJECTIVE:   Vital Signs: BP 140/60 (BP Location: Right Arm, Patient Position: Sitting, Cuff Size:  Normal)   Pulse 69   Ht '5\' 6"'$  (1.676 m)   Wt 215 lb 12.8 oz (97.9 kg)   SpO2 99%   BMI 34.83 kg/m   Wt Readings from Last 3 Encounters:  04/29/21 215 lb 12.8 oz (97.9 kg)  04/28/21 216 lb (98 kg)  04/27/21 216 lb 9.6 oz (98.2 kg)     Exam: General: Pt appears well and is in NAD  Lungs: Clear with good BS bilat with no rales, rhonchi, or wheezes  Heart: RRR with normal S1 and S2 and no gallops; no murmurs; no rub  Abdomen: Normoactive bowel sounds, soft, nontender, without masses or organomegaly palpable  Extremities: No pretibial edema. No tremor. Normal strength and motion throughout. See detailed diabetic foot exam below.  Skin: Normal texture and temperature to palpation. No rash noted. No Acanthosis nigricans/skin tags. No lipohypertrophy.  Neuro: MS is good with appropriate affect, pt is alert and Ox3    DM foot exam:04/29/2021     The skin of the feet is without sores or ulcerations, toe nailed thickened The pedal pulses are 1+ on right and 1+ on left. The sensation is decreased  to a screening 5.07, 10 gram monofilament on the right     DATA REVIEWED:  Lab Results  Component Value Date   HGBA1C 5.9 (A) 04/29/2021   HGBA1C 7.7 (H) 02/12/2021   HGBA1C 8.8 (A) 12/23/2020    Results for DEFELICE, Arayla W (MRN [id number]) as of 04/29/2021 10:01  Ref. Range  03/31/2021 08:36  Sodium Latest Ref Range: 135 - 145 mmol/L 136  Potassium Latest Ref Range: 3.5 - 5.1 mmol/L 3.5  Chloride Latest Ref Range: 98 - 111 mmol/L 100  CO2 Latest Ref Range: 22 - 32 mmol/L 24  Glucose Latest Ref Range: 70 - 99 mg/dL 87  BUN Latest Ref Range: 8 - 23 mg/dL 15  Creatinine Latest Ref Range: 0.44 - 1.00 mg/dL 1.13 (H)  Calcium Latest Ref Range: 8.9 - 10.3 mg/dL 9.5  Anion gap Latest Ref Range: 5 - 15  12  GFR, Estimated Latest Ref Range: >60 mL/min 49 (L)  Troponin I (High Sensitivity) Latest Ref Range: <18 ng/L 6  Results for HIMA, LINNEY (MRN [id number]) as of 04/29/2021 10:01  Ref. Range  02/12/2021 09:57  TSH Latest Ref Range: 0.40 - 4.50 mIU/L 2.82    ASSESSMENT / PLAN / RECOMMENDATIONS:   1) Type 2 Diabetes Mellitus, With  Neuropathic complications - Most recent A1c of 5.9 %. Goal A1c < 7.5 %.    -  A1c down from 7.7 % but with a an A1c of 5.9% I am concerned about hypoglycemia.  - Intolerant to SGLT-2 inhibitors due to severe genital irritations  - I have reviewed her CGM and will reduce insulin as below  MEDICATIONS: - Decrease Toujeo to 90  units daily  - Decrease   Humalog to 10 units with each meal  - Continue Victoza 1.8 mg daily  - CF : Humalog ( BG - 140/30 )    EDUCATION / INSTRUCTIONS: BG monitoring instructions: Patient is instructed to check her blood sugars 3 times a day, before meals . Call [facility] if: BG persistently < 70  I reviewed the Rule of 15 for the treatment of hypoglycemia in detail with the patient. Literature supplied.   2) Diabetic complications:  Eye: Does not have known diabetic retinopathy.  Neuro/ Feet: Does  have known diabetic peripheral neuropathy .  Renal: Patient does not have known baseline CKD.    3) Hypothyroidism:   - Pt is bio chemically euthyorid  -.She takes it appropriately, no changes today   Medication Continue Levothyroxine 112 mcg daily     F/U in 4 months     Signed electronically by: Mack Guise, MD  [facility] Endocrinology  [facility] [address] [place], [place] [address] Phone: [phone] FAX: [phone]   CC: Sandrea Hughs, NP [place] [address] Phone: [phone]  Fax: [phone]  Return to Endocrinology clinic as below: Future Appointments  Date Time Provider [place]  04/29/2021  9:30 AM Lynnlee Revels, Melanie Crazier, MD [facility] None  06/01/2021  9:40 AM Raulkar, Clide Deutscher, MD [facility] CPR  08/17/2021 10:30 AM Ngetich, Nelda Bucks, NP [facility] None  09/30/2021  2:30 PM Bo Merino, MD  [facility] None  02/17/2022 11:00 AM Ngetich, Nelda Bucks, NP [facility] None

## 2021-04-30 LAB — URINE CULTURE
MICRO NUMBER:: 12341808
SPECIMEN QUALITY:: ADEQUATE

## 2021-05-03 ENCOUNTER — Ambulatory Visit: Payer: Medicare Other | Attending: Physical Medicine and Rehabilitation | Admitting: Occupational Therapy

## 2021-05-04 ENCOUNTER — Telehealth: Payer: Self-pay | Admitting: *Deleted

## 2021-05-04 MED ORDER — CIPROFLOXACIN HCL 500 MG PO TABS: 500.0000 mg | ORAL_TABLET | Freq: Two times a day (BID) | ORAL | 0 refills | Status: DC

## 2021-05-04 MED ORDER — SACCHAROMYCES BOULARDII 250 MG PO CAPS: 250.0000 mg | ORAL_CAPSULE | Freq: Two times a day (BID) | ORAL | 0 refills | Status: DC

## 2021-05-04 NOTE — Telephone Encounter (Signed)
Script signed.

## 2021-05-04 NOTE — Telephone Encounter (Signed)
See lab results.  

## 2021-05-04 NOTE — Telephone Encounter (Signed)
Katherine Hughs, NP  05/04/2021  2:13 PM EDT     Final urine culture indicates UTI.Start on Cipro 500 mg tablet one by mouth twice daily x 7 days Take along with probiotics Florastor 250 mg capsule one by mouth twice daily x 10 days to prevent antibiotics associated diarrhea.     Patient notified and agreed. Rx's Pended and sent to [facility] for approval due to [place].

## 2021-05-04 NOTE — Telephone Encounter (Signed)
Patient called and wanted to know if you have received the results from her Urine Culture.   Please Advise.

## 2021-05-06 ENCOUNTER — Telehealth: Payer: Self-pay | Admitting: *Deleted

## 2021-05-06 DIAGNOSIS — R21 Rash and other nonspecific skin eruption: Secondary | ICD-10-CM

## 2021-05-06 NOTE — Telephone Encounter (Signed)
Patient notified and agreed.  Stated that she would like for you to place the order for Dermatologist. Stated that she does not have one.

## 2021-05-06 NOTE — Telephone Encounter (Signed)
Recommend urgent referral to dermatologist for further evaluation.

## 2021-05-06 NOTE — Telephone Encounter (Signed)
Patient called and stated that her shingles have spread and the pain is worse. Stated that she has completed the valacyclovir, but wonders if she needs another round. Spread on arm and up the elbow. Bumps feel like they are under the skin, itches and hurts. Has been using the Gabapentin with no relief. Also been using alcohol on it. Using Calamine lotion some, but doesn't seem to work as good as the alcohol based. Has been going on since 8/10.  Another round of Valacyclovir? What can she rub on it? Anything to help the pain? (Knows it cant be much because she is going to the pain center)   Please Advise.

## 2021-05-06 NOTE — Telephone Encounter (Signed)
Dermatology referral ordered.  

## 2021-05-07 ENCOUNTER — Ambulatory Visit: Payer: Medicare Other | Admitting: Registered Nurse

## 2021-05-11 DIAGNOSIS — H6522 Chronic serous otitis media, left ear: Secondary | ICD-10-CM | POA: Diagnosis not present

## 2021-05-11 DIAGNOSIS — H90A32 Mixed conductive and sensorineural hearing loss, unilateral, left ear with restricted hearing on the contralateral side: Secondary | ICD-10-CM | POA: Diagnosis not present

## 2021-05-11 DIAGNOSIS — H90A21 Sensorineural hearing loss, unilateral, right ear, with restricted hearing on the contralateral side: Secondary | ICD-10-CM | POA: Diagnosis not present

## 2021-05-12 ENCOUNTER — Encounter: Payer: Self-pay | Admitting: Internal Medicine

## 2021-05-18 ENCOUNTER — Telehealth: Payer: Self-pay | Admitting: *Deleted

## 2021-05-18 NOTE — Telephone Encounter (Signed)
Will need to evaluate in office rash prior to prescribing another antifungal

## 2021-05-18 NOTE — Telephone Encounter (Signed)
Patient called and stated that she finished her antibiotic but she feels like she has a yeast infection. Stated that she has burning and itching  and rawness in the groin area and under breast.   Requesting Rx to be sent to pharmacy.   Please Advise.

## 2021-05-18 NOTE — Telephone Encounter (Signed)
Patient scheduled an appointment for Thursday for evaluation.

## 2021-05-20 ENCOUNTER — Ambulatory Visit
Admission: RE | Admit: 2021-05-20 | Discharge: 2021-05-20 | Disposition: A | Discharge status: Home / Self Care | Payer: Medicare Other | Source: Ambulatory Visit | Attending: Family | Admitting: Family

## 2021-05-20 ENCOUNTER — Ambulatory Visit (INDEPENDENT_AMBULATORY_CARE_PROVIDER_SITE_OTHER): Payer: Medicare Other | Admitting: Family

## 2021-05-20 ENCOUNTER — Encounter: Payer: Self-pay | Admitting: Family

## 2021-05-20 ENCOUNTER — Other Ambulatory Visit: Payer: Self-pay

## 2021-05-20 VITALS — BP 160/90 | HR 71 | Temp 97.1°F | Resp 20 | Ht 66.0 in | Wt 209.0 lb

## 2021-05-20 DIAGNOSIS — B372 Candidiasis of skin and nail: Secondary | ICD-10-CM

## 2021-05-20 DIAGNOSIS — M7989 Other specified soft tissue disorders: Secondary | ICD-10-CM | POA: Diagnosis not present

## 2021-05-20 DIAGNOSIS — M19021 Primary osteoarthritis, right elbow: Secondary | ICD-10-CM | POA: Diagnosis not present

## 2021-05-20 DIAGNOSIS — R531 Weakness: Secondary | ICD-10-CM | POA: Diagnosis not present

## 2021-05-20 DIAGNOSIS — N390 Urinary tract infection, site not specified: Secondary | ICD-10-CM

## 2021-05-20 DIAGNOSIS — R29898 Other symptoms and signs involving the musculoskeletal system: Secondary | ICD-10-CM | POA: Diagnosis not present

## 2021-05-20 DIAGNOSIS — R399 Unspecified symptoms and signs involving the genitourinary system: Secondary | ICD-10-CM

## 2021-05-20 DIAGNOSIS — M19041 Primary osteoarthritis, right hand: Secondary | ICD-10-CM | POA: Diagnosis not present

## 2021-05-20 DIAGNOSIS — M85841 Other specified disorders of bone density and structure, right hand: Secondary | ICD-10-CM | POA: Diagnosis not present

## 2021-05-20 LAB — POCT URINALYSIS DIPSTICK
Bilirubin, UA: NEGATIVE
Blood, UA: NEGATIVE
Glucose, UA: NEGATIVE
Ketones, UA: POSITIVE
Nitrite, UA: NEGATIVE
Protein, UA: NEGATIVE
Spec Grav, UA: 1.01 (ref 1.010–1.025)
Urobilinogen, UA: NEGATIVE E.U./dL — AB
pH, UA: 6.5 (ref 5.0–8.0)

## 2021-05-20 MED ORDER — KETOCONAZOLE 2 % EX CREA: 1.0000 "application " | TOPICAL_CREAM | Freq: Two times a day (BID) | CUTANEOUS | 1 refills | Status: DC

## 2021-05-20 MED ORDER — FLUCONAZOLE 150 MG PO TABS: ORAL_TABLET | ORAL | 1 refills | Status: DC

## 2021-05-20 NOTE — Patient Instructions (Addendum)
- Please get right elbow/hand X-ray at [place] at [facility] then will call you with results. Intertrigo Intertrigo is skin irritation (inflammation) that happens in warm, moist areas of the body. The irritation can cause a rash and make skin raw and itchy. The rash is usually pink or red. It happens mostly between folds of skin or where skin rubs together, such as: Between the toes. In the armpits. In the groin area. Under the belly. Under the breasts. Around the butt area. This condition is not passed from person to person (is not contagious). What are the causes? Heat, moisture, rubbing, and not enough air movement. The condition can be made worse by: Sweat. Bacteria. A fungus, such as yeast. What increases the risk? Moisture in your skin folds. You are more likely to develop this condition if you: Have diabetes. Are overweight. Are not able to move around. Live in a warm and moist climate. Wear splints, braces, or other medical devices. Are not able to control your pee (urine) or poop (stool). What are the signs or symptoms? A pink or red skin rash in the skin fold or near the skin fold. Raw or scaly skin. Itching. A burning feeling. Bleeding. Leaking fluid. A bad smell. How is this treated? Cleaning and drying your skin. Taking an antibiotic medicine or using an antibiotic skin cream for a bacterial infection. Using an antifungal cream on your skin or taking pills for an infection that was caused by a fungus, such as yeast. Using a steroid ointment to stop the itching and irritation. Separating the skin fold with a clean cotton cloth to absorb moisture and allow air to flow into the area. Follow these instructions at home: Keep the affected area clean and dry. Do not scratch your skin. Stay cool as much as you can. Use an air conditioner or a fan, if you have one. Apply over-the-counter and prescription medicines only as told by your  doctor. If you were prescribed an antibiotic medicine, use it as told by your doctor. Do not stop using the antibiotic even if your condition starts to get better. Keep all follow-up visits as told by your doctor. This is important. How is this prevented?  Stay at a healthy weight. Take care of your feet. This is very important if you have diabetes. You should: Wear shoes that fit well. Keep your feet dry. Wear clean cotton or wool socks. Protect the skin in your groin and butt area as told by your doctor. To do this: Follow a regular cleaning routine. Use creams, powders, or ointments that protect your skin. Change protection pads often. Do not wear tight clothes. Wear clothes that: Are loose. Take moisture away from your body. Are made of cotton. Wear a bra that gives good support, if needed. Shower and dry yourself well after being active. Use a hair dryer on a cool setting to dry between skin folds. Keep your blood sugar under control if you have diabetes. Contact a doctor if: Your symptoms do not get better with treatment. Your symptoms get worse or they spread. You notice more redness and warmth. You have a fever. Summary Intertrigo is skin irritation that occurs when folds of skin rub together. This condition is caused by heat, moisture, and rubbing. This condition may be treated by cleaning and drying your skin and with medicines. Apply over-the-counter and prescription medicines only as told by your doctor. Keep all follow-up visits as told by your doctor. This is  important. This information is not intended to replace advice given to you by your health care provider. Make sure you discuss any questions you have with your health care provider. Document Revised: 05/17/2018 Document Reviewed: 05/17/2018 Elsevier Patient Education  2022 [employer].

## 2021-05-20 NOTE — Progress Notes (Signed)
Provider: Marlowe Sax FNP-C  Abagale Boulos, Nelda Bucks, NP  Patient Care Team: Vinton Layson, Nelda Bucks, NP as PCP - General (Family Medicine) Bo Merino, MD (Rheumatology) Altheimer, Legrand Como, MD as Attending Physician (Endocrinology) Newt Minion, MD as Consulting Physician (Orthopedic Surgery)  Extended Emergency Contact Information Primary Emergency Contact: [facility] Address: [address]          [place], [place] [address] Johnnette [place] of [place] Phone: [phone] Relation: Son Secondary Emergency Contact: Marlana Latus, [place] Mobile Phone: [phone] Relation: Son  Code Status:  Full Code  Goals of care: Advanced Directive information Advanced Directives 04/12/2021  Does Patient Have a Medical Advance Directive? No  Type of Advance Directive -  Does patient want to make changes to medical advance directive? No - Patient declined  Copy of [place] in Chart? -  Would patient like information on creating a medical advance directive? -  Pre-existing out of facility DNR order (yellow form or pink MOST form) -     Chief Complaint  Patient presents with   Acute Visit    Patient presents today for burning and raw in groin area and left breast for about one month now. She reports the nystatin powder or cream didn't help and seems like it is getting worse. She also reports right arm, elbow and hand is painful and goes numb at times for a week now.    HPI:  Pt is a 80 y.o. female seen today for an acute visit for evaluation of burning and raw areas on groin area and under the breast x 1 month.Has applied Nystatin powder and cream.Nystatin cream seems to help better. Has had a strong smell from groin area. Also having frequent urination would like urine check for infection.  She is concerned about her right hand weakness for the past 3 weeks.states cannot straighten fingers or use hand to lift or  write.symptoms worsening since she had shingles on forearm and hand.states can feel palm area but cannot straighten fingers unless she uses left hand.    Past Medical History:  Diagnosis Date   Abnormality of gait 04/19/2016   Acute infective polyneuritis ([place]) 2002   Allergic rhinitis due to pollen    Chronic pain syndrome    COPD (chronic obstructive pulmonary disease) (HCC)    chronic bronchitis   Depressive disorder, not elsewhere classified    Diabetes mellitus without complication ([place])    Diaphragmatic hernia without mention of obstruction or gangrene    Dyslipidemia    Fibromyalgia    GERD (gastroesophageal reflux disease)    with h/o esophagitis   Guillain-Barre syndrome ([place])    History of benign thymus tumor    Insomnia, unspecified    Lumbar spinal stenosis 03/30/2018   L4-5 level, severe   Miscarriage 1962   Mixed hyperlipidemia    Morbid obesity ([place])    Osteoporosis    Pneumonia    Polyneuropathy in diabetes(357.2)    Rheumatoid arthritis(714.0)    Spondylosis, lumbosacral    Spontaneous ecchymoses    Type II or unspecified type diabetes mellitus with peripheral circulatory disorders, uncontrolled(250.72)    Unspecified chronic bronchitis (HCC)    Unspecified essential hypertension    Unspecified hypothyroidism    Unspecified pruritic disorder    Unspecified urinary incontinence    Past Surgical History:  Procedure Laterality Date   ABDOMINAL HYSTERECTOMY  1974   abdominal tumor  2002  APPENDECTOMY     APPLICATION OF A-CELL OF BACK N/A 09/26/2018   Procedure: With Acell;  Surgeon: Wallace Going, DO;  Location: [facility];  Service: Plastics;  Laterality: N/A;   APPLICATION OF WOUND VAC N/A 09/26/2018   Procedure: Vac placement;  Surgeon: Wallace Going, DO;  Location: [facility];  Service: Plastics;  Laterality: N/A;   BACK SURGERY  1982   BRONCHOSCOPY  2001   CATARACT EXTRACTION, BILATERAL  09/2019, 10/2019   CHOLECYSTECTOMY  1984   INCISION AND DRAINAGE  OF WOUND N/A 09/26/2018   Procedure: Debridement of spine wound;  Surgeon: Wallace Going, DO;  Location: [facility];  Service: Plastics;  Laterality: N/A;   KNEE SURGERY Bilateral 08/27/2010 (L) and 01/11/2011 (R)   LUMBAR LAMINECTOMY/DECOMPRESSION MICRODISCECTOMY N/A 07/03/2018   Procedure: Lumbar Three to Lumbar Five Laminectomy;  Surgeon: Judith Part, MD;  Location: [facility];  Service: Neurosurgery;  Laterality: N/A;   LUMBAR WOUND DEBRIDEMENT N/A 08/20/2018   Procedure: POSTERIOR LUMBAR SPINAL WOUND DEBRIDEMENT AND REVISION;  Surgeon: Judith Part, MD;  Location: [facility];  Service: Neurosurgery;  Laterality: N/A;  POSTERIOR LUMBAR SPINAL WOUND REVISION   miscarrage  1962   OVARIAN CYST SURGERY  1968   thymus tumor     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   TONSILLECTOMY      Allergies  Allergen Reactions   Influenza Vaccines Other (See Comments)    "h/o Guillain Barre; dr's told me years ago never to take another flu shot as it could relapse Rosalee Kaufman; has to do with when vaccine being changed to H1N1 virus"   Penicillins Hives, Itching, Swelling and Rash    Has patient had a PCN reaction causing immediate rash, facial/tongue/throat swelling, SOB or lightheadedness with hypotension:Unknown Has patient had a PCN reaction causing severe rash involving mucus membranes or skin necrosis: Unknown Has patient had a PCN reaction that required hospitalization:Unknown Has patient had a PCN reaction occurring within the last 10 years: Unknown If all of the above answers are "NO", then may proceed with Cephalosporin use.    Statins Other (See Comments)    Myopathy, transaminitis   Sulfamethoxazole-Trimethoprim Itching    Outpatient Encounter Medications as of 05/20/2021  Medication Sig   acetaminophen (TYLENOL) 325 MG tablet Take 650 mg by mouth every 6 (six) hours as needed (for pain.).   amitriptyline (ELAVIL) 25 MG tablet TAKE 1 TABLET(25 MG) BY MOUTH AT BEDTIME   amLODipine (NORVASC) 10  MG tablet TAKE 1 TABLET(10 MG) BY MOUTH DAILY   aspirin EC 81 MG tablet Take 81 mg by mouth 2 (two) times a week.   calamine lotion Apply 1 application topically 3 (three) times daily. To affected areas on right fore arm   cholecalciferol (VITAMIN D3) 25 MCG (1000 UT) tablet Take 1,000 Units by mouth daily.   ciprofloxacin (CIPRO) 500 MG tablet Take 1 tablet (500 mg total) by mouth 2 (two) times daily. For 7 days   Cranberry 475 MG CAPS Take 1 capsule (475 mg total) by mouth 2 (two) times daily.   docusate sodium (COLACE) 100 MG capsule Take 100 mg by mouth daily as needed for mild constipation.   DULoxetine (CYMBALTA) 20 MG capsule Take 1 capsule (20 mg total) by mouth daily.   ezetimibe (ZETIA) 10 MG tablet Take 1 tablet (10 mg total) by mouth daily.   fluconazole (DIFLUCAN) 150 MG tablet Take 150 mg by mouth once.   fluconazole (DIFLUCAN) 150 MG tablet Take one by  mouth x 1 dose then repeat dose in one week.   folic acid (FOLVITE) 1 MG tablet TAKE 2 TABLETS BY MOUTH  DAILY   furosemide (LASIX) 20 MG tablet TAKE 1 TABLET BY MOUTH TWICE DAILY AS NEEDED FOR 3 POUND WEIGHT GAIN IN ONE DAY OR 5 POUNDS IN ONE WEEK   gabapentin (NEURONTIN) 600 MG tablet Take 600 mg by mouth 2 (two) times daily.   insulin glargine, 1 Unit Dial, (TOUJEO SOLOSTAR) 300 UNIT/ML Solostar Pen Inject 90 Units into the skin daily in the afternoon.   insulin lispro (HUMALOG KWIKPEN) 200 UNIT/ML KwikPen Max daily 80 units   Insulin Pen Needle (B-D UF III MINI PEN NEEDLES) 31G X 5 MM MISC USE AS DIRECTED   ipratropium-albuterol (DUONEB) 0.5-2.5 (3) MG/3ML SOLN Take 3 mLs by nebulization every 6 (six) hours as needed.   ketoconazole (NIZORAL) 2 % cream Apply 1 application topically 2 (two) times daily.   levothyroxine (SYNTHROID) 112 MCG tablet Take 1 tablet (112 mcg total) by mouth daily.   lidocaine (LIDODERM) 5 % Place 1 patch onto the skin daily. Remove & Discard patch within 12 hours or as directed by MD   liraglutide  (VICTOZA) 18 MG/3ML SOPN Administer 1.8mg  under the skin daily for blood sugar.   lisinopril (ZESTRIL) 20 MG tablet Take one tablet by mouth once daily.   loperamide (IMODIUM A-D) 2 MG tablet Take 2 mg by mouth 4 (four) times daily as needed for diarrhea or loose stools.   Melatonin 10 MG TABS Take 10 mg by mouth at bedtime as needed (sleep).   Menthol, Topical Analgesic, (ICY HOT MEDICATED SPRAY EX) Apply 1 application topically as needed (shoulder pain).   metoprolol tartrate (LOPRESSOR) 50 MG tablet TAKE 1 TABLET BY MOUTH TWICE DAILY   mirabegron ER (MYRBETRIQ) 50 MG TB24 tablet Take 1 tablet (50 mg total) by mouth daily.   Multiple Vitamins-Minerals (MULTIVITAMIN ADULT PO) Take 1 tablet by mouth daily.   nystatin (NYSTATIN) powder APPLY TOPICALLY THREE TIMES DAILY AS DIRECTED FOR 14 DAYS and prn yeast infection   nystatin cream (MYCOSTATIN) Apply 1 application topically 3 (three) times daily. To skin folds   omega-3 acid ethyl esters (LOVAZA) 1 g capsule TAKE ONE CAPSULE BY MOUTH EVERY DAY   oxyCODONE-acetaminophen (PERCOCET) 10-325 MG tablet Take 1 tablet by mouth every 6 (six) hours as needed for pain.   polyvinyl alcohol (LIQUIFILM TEARS) 1.4 % ophthalmic solution Place 1 drop into both eyes 4 (four) times daily as needed (for dry/irritated eyes).    potassium chloride (MICRO-K) 10 MEQ CR capsule Take 2 capsules (20 mEq total) by mouth 2 (two) times daily as needed (when taking a dose of lasix.).   saccharomyces boulardii (FLORASTOR) 250 MG capsule Take 1 capsule (250 mg total) by mouth 2 (two) times daily. For 10 days.   sodium chloride (OCEAN) 0.65 % nasal spray Place 2 sprays into the nose as needed for congestion.   tiotropium (SPIRIVA) 18 MCG inhalation capsule Place 18 mcg into inhaler and inhale as needed.   tiZANidine (ZANAFLEX) 2 MG tablet TAKE 1 TABLET(2 MG) BY MOUTH THREE TIMES DAILY   Tofacitinib Citrate (XELJANZ) 5 MG TABS Take 1 tablet (5 mg total) by mouth daily.    triamterene-hydrochlorothiazide (MAXZIDE-25) 37.5-25 MG tablet Take 1 tablet by mouth daily.   venlafaxine XR (EFFEXOR-XR) 75 MG 24 hr capsule TAKE 1 CAPSULE BY MOUTH EVERY DAY WITH BREAKFAST   [DISCONTINUED] valACYclovir (VALTREX) 1000 MG tablet TAKE 1 TABLET(1000 MG)  BY MOUTH THREE TIMES DAILY FOR 7 DAYS   No facility-administered encounter medications on file as of 05/20/2021.    Review of Systems  Constitutional:  Negative for appetite change, chills, fatigue, fever and unexpected weight change.  Respiratory:  Negative for cough, chest tightness, shortness of breath and wheezing.   Cardiovascular:  Negative for chest pain, palpitations and leg swelling.  Gastrointestinal:  Negative for abdominal distention, abdominal pain, constipation and nausea.  Genitourinary:  Positive for frequency. Negative for difficulty urinating, dysuria and flank pain.  Musculoskeletal:  Positive for arthralgias and gait problem. Negative for back pain, joint swelling, myalgias, neck pain and neck stiffness.  Skin:  Negative for color change and pallor.       Redness on breast,groin and between the gluteal   Psychiatric/Behavioral:  Negative for agitation, behavioral problems, confusion, hallucinations and sleep disturbance. The patient is not nervous/anxious.    Immunization History  Administered Date(s) Administered   Influenza-Unspecified 06/10/2011   PFIZER(Purple Top)SARS-COV-2 Vaccination 11/13/2019, 11/30/2019   Pneumococcal Conjugate-13 07/07/2016   Pneumococcal Polysaccharide-23 11/21/2003, 10/05/2017   Tdap 05/28/2016   Pertinent  Health Maintenance Due  Topic Date Due   FOOT EXAM  10/15/2021   HEMOGLOBIN A1C  10/27/2021   OPHTHALMOLOGY EXAM  03/25/2022   DEXA SCAN  09/20/2025   INFLUENZA VACCINE  Discontinued   Fall Risk  05/20/2021 04/27/2021 04/12/2021 03/11/2021 02/17/2021  Falls in the past year? 0 0 0 0 0  Number falls in past yr: 0 - 0 0 -  Comment - - - - -  Injury with Fall? 0 - 0 0 -   Risk for fall due to : History of fall(s) - No Fall Risks - -  Follow up Falls evaluation completed;Education provided;Falls prevention discussed - Falls evaluation completed - -   Functional Status Survey:    Vitals:   05/20/21 1041  BP: (!) 160/90  Pulse: 71  Resp: 20  Temp: (!) 97.1 F (36.2 C)  SpO2: 98%  Weight: 209 lb (94.8 kg)  Height: 5\' 6"  (1.676 m)   Body mass index is 33.73 kg/m. Physical Exam Vitals reviewed.  Constitutional:      General: She is not in acute distress.    Appearance: Normal appearance. She is obese. She is not ill-appearing or diaphoretic.  HENT:     Head: Normocephalic.  Eyes:     General: No scleral icterus.       Right eye: No discharge.        Left eye: No discharge.     Conjunctiva/sclera: Conjunctivae normal.     Pupils: Pupils are equal, round, and reactive to light.  Neck:     Vascular: No carotid bruit.  Cardiovascular:     Rate and Rhythm: Normal rate and regular rhythm.     Pulses: Normal pulses.     Heart sounds: Normal heart sounds. No murmur heard.   No friction rub. No gallop.  Pulmonary:     Effort: Pulmonary effort is normal. No respiratory distress.     Breath sounds: Normal breath sounds. No wheezing, rhonchi or rales.  Chest:     Chest wall: No tenderness.  Abdominal:     General: Bowel sounds are normal. There is no distension.     Palpations: Abdomen is soft. There is no mass.     Tenderness: There is no abdominal tenderness. There is no right CVA tenderness, left CVA tenderness, guarding or rebound.  Musculoskeletal:        General:  No swelling or tenderness.     Right hand: No swelling or tenderness. Decreased range of motion. Decreased strength. Normal sensation. Normal capillary refill. Normal pulse.     Left hand: Normal.     Cervical back: Normal range of motion. No rigidity or tenderness.     Right lower leg: No edema.     Left lower leg: No edema.  Lymphadenopathy:     Cervical: No cervical  adenopathy.  Skin:    General: Skin is warm and dry.     Coloration: Skin is not pale.     Findings: Erythema present.     Comments: Bilateral breast, groin and gluteal fold beefy redness.strong odor from groin area noted.   Neurological:     Mental Status: She is alert and oriented to person, place, and time.     Cranial Nerves: No cranial nerve deficit.     Sensory: No sensory deficit.     Motor: No weakness.     Coordination: Coordination normal.     Gait: Gait normal.  Psychiatric:        Speech: Speech normal.     Labs reviewed: Recent Labs    01/25/21 1037 02/12/21 0957 03/31/21 0836  NA 141 138 136  K 3.9 4.2 3.5  CL 102 98 100  CO2 28 29 24   GLUCOSE 112* 127* 87  BUN 16 14 15   CREATININE 0.92 0.96* 1.13*  CALCIUM 9.7 8.9 9.5   Recent Labs    01/19/21 1016 01/25/21 1037 02/12/21 0957  AST 22 26 16   ALT 12 18 12   ALKPHOS  --  96  --   BILITOT 0.3 0.4 0.4  PROT 6.9 7.4 5.9*  ALBUMIN  --  3.5  --    Recent Labs    01/25/21 1037 02/12/21 0957 03/31/21 0836  WBC 16.1* 16.2* 10.5  NEUTROABS 12.8* 13,268* 8.8*  HGB 13.8 11.4* 14.2  HCT 42.3 36.3 44.3  MCV 84.8 85.4 84.5  PLT 293 412* 258   Lab Results  Component Value Date   TSH 2.82 02/12/2021   Lab Results  Component Value Date   HGBA1C 5.9 (A) 04/29/2021   Lab Results  Component Value Date   CHOL 127 02/12/2021   HDL 17 (L) 02/12/2021   LDLCALC 76 02/12/2021   TRIG 259 (H) 02/12/2021   CHOLHDL 7.5 (H) 02/12/2021    Significant Diagnostic Results in last 30 days:  No results found.  Assessment/Plan 1. Candidiasis, intertrigo Bilateral breast, groin and gluteal fold beefy redness.strong odor from groin area noted.  - ketoconazole (NIZORAL) 2 % cream; Apply 1 application topically 2 (two) times daily.  Dispense: 60 g; Refill: 1 - fluconazole (DIFLUCAN) 150 MG tablet; Take one by mouth x 1 dose then repeat dose in one week.  Dispense: 2 tablet; Refill: 1 - advised to keep skin folds dry    2. Symptoms of urinary tract infection Reports urine frequency Will rule out UTI Urine drip stick indicates yellow cloudy urine with trace of leukocytes but negative for blood and nitrites.  - Urine Culture  3. Frequent UTI Will refer to Urologist for further evaluation  - Ambulatory referral to Urology  4. Right hand weakness No bruises.limited ROM to fingers.  - Ambulatory referral to Neurology - DG Hand Complete Right; Future - DG Elbow Complete Right; Future  Family/ staff Communication: Reviewed plan of care with patient verbalized understanding.   Labs/tests ordered:  - DG Hand Complete Right; Future - DG Elbow Complete  Right; Future - Urine Culture  Next Appointment: As needed if symptoms worsen or fail to improve    Sandrea Hughs, NP

## 2021-05-20 NOTE — Progress Notes (Signed)
Urine c

## 2021-05-21 ENCOUNTER — Ambulatory Visit: Payer: Medicare Other | Admitting: Physical Medicine and Rehabilitation

## 2021-05-21 LAB — URINE CULTURE
MICRO NUMBER:: 12442138
Result:: NO GROWTH
SPECIMEN QUALITY:: ADEQUATE

## 2021-05-24 ENCOUNTER — Encounter: Payer: Self-pay | Admitting: Neurology

## 2021-05-24 ENCOUNTER — Other Ambulatory Visit: Payer: Self-pay

## 2021-05-24 MED ORDER — OXYCODONE-ACETAMINOPHEN 10-325 MG PO TABS: 1.0000 | ORAL_TABLET | Freq: Four times a day (QID) | ORAL | 0 refills | Status: DC | PRN

## 2021-05-29 DIAGNOSIS — M4801 Spinal stenosis, occipito-atlanto-axial region: Secondary | ICD-10-CM | POA: Diagnosis not present

## 2021-05-29 DIAGNOSIS — R531 Weakness: Secondary | ICD-10-CM | POA: Diagnosis not present

## 2021-06-01 ENCOUNTER — Encounter
Payer: Medicare Other | Attending: Physical Medicine and Rehabilitation | Admitting: Physical Medicine and Rehabilitation

## 2021-06-01 ENCOUNTER — Encounter: Payer: Self-pay | Admitting: Physical Medicine and Rehabilitation

## 2021-06-01 ENCOUNTER — Other Ambulatory Visit: Payer: Self-pay

## 2021-06-01 VITALS — BP 120/60 | HR 73 | Temp 98.4°F | Ht 66.0 in | Wt 212.6 lb

## 2021-06-01 DIAGNOSIS — R29898 Other symptoms and signs involving the musculoskeletal system: Secondary | ICD-10-CM | POA: Diagnosis not present

## 2021-06-01 DIAGNOSIS — M961 Postlaminectomy syndrome, not elsewhere classified: Secondary | ICD-10-CM | POA: Diagnosis not present

## 2021-06-01 NOTE — Progress Notes (Signed)
Subjective:    Patient ID: Katherine Delgado, female    DOB: 01-12-1941, 80 y.o.   MRN: [id number]  HPI: Katherine Delgado is a 80 y.o. female who returns for follow up appointment for chronic pain and medication refill. She states her pain is located in her bilateral shoulders, lower back pain radiating into his left hip. Her current exercise regime is receiving PT/OT two days a week.  Katherine Delgado Morphine equivalent is 60.00  MME. Her pain medications are not helping her right hand post-shingles pain. The pain has gotten better but it still can be sever and feels like someone beat her with a hammer. Pain radiates up into the elbow and shoulder. The pain can be excruciating. She asks if we can add something else.   She was hospitalized with pneumonia since I saw her last. She was also told she had shingles, and she is toward the end of it now. Her fingers still have decreased strength on her right side- she does note improvements. She cannot get her right thumb to elevated. She will be seeing Dr. Estanislado Pandy. She has been unable to write.   She has been using cranberry for her incontinence.    Pain Inventory Average Pain 8 Pain Right Now 9 My pain is sharp, burning, stabbing, and aching  In the last 24 hours, has pain interfered with the following? General activity 10 Relation with others 10 Enjoyment of life 10 What TIME of day is your pain at its worst? night Sleep (in general) Poor  Pain is worse with: walking, bending, sitting, standing, and some activites Pain improves with: rest, therapy/exercise, and medication Relief from Meds: 3  Family History  Problem Relation Age of Onset   Alzheimer's disease Mother    Heart disease Mother    Heart disease Father    Liver disease Father    Cancer Brother    Arthritis Son    Colon polyps Brother    Colon cancer Neg Hx    Esophageal cancer Neg Hx    Kidney disease Neg Hx    Stomach cancer Neg Hx    Rectal cancer Neg Hx    Social History    Socioeconomic History   Marital status: Widowed    Spouse name: Not on file   Number of children: 2   Years of education: 14   Highest education level: Not on file  Occupational History   Occupation: Retired  Tobacco Use   Smoking status: Former    Packs/day: 0.10    Years: 10.00    Pack years: 1.00    Types: Cigarettes    Quit date: 12/07/1979    Years since quitting: 41.5   Smokeless tobacco: Never  Vaping Use   Vaping Use: Never used  Substance and Sexual Activity   Alcohol use: No    Alcohol/week: 0.0 standard drinks   Drug use: No   Sexual activity: Not Currently  Other Topics Concern   Not on file  Social History Narrative   Walks with cane   Right handed    Caffeine use: Coffee (2 cups every morning)   Tea: sometimes   Soda: none   Social Determinants of Radio broadcast assistant Strain: Not on file  Food Insecurity: Not on file  Transportation Needs: Not on file  Physical Activity: Not on file  Stress: Not on file  Social Connections: Not on file   Past Surgical History:  Procedure Laterality Date   ABDOMINAL HYSTERECTOMY  1974   abdominal tumor  2002   APPENDECTOMY     APPLICATION OF A-CELL OF BACK N/A 09/26/2018   Procedure: With Acell;  Surgeon: Wallace Going, DO;  Location: [place];  Service: Plastics;  Laterality: N/A;   APPLICATION OF WOUND VAC N/A 09/26/2018   Procedure: Vac placement;  Surgeon: Wallace Going, DO;  Location: [place];  Service: Plastics;  Laterality: N/A;   BACK SURGERY  1982   BRONCHOSCOPY  2001   CATARACT EXTRACTION, BILATERAL  09/2019, 10/2019   CHOLECYSTECTOMY  1984   INCISION AND DRAINAGE OF WOUND N/A 09/26/2018   Procedure: Debridement of spine wound;  Surgeon: Wallace Going, DO;  Location: [place];  Service: Plastics;  Laterality: N/A;   KNEE SURGERY Bilateral 08/27/2010 (L) and 01/11/2011 (R)   LUMBAR LAMINECTOMY/DECOMPRESSION MICRODISCECTOMY N/A 07/03/2018   Procedure: Lumbar Three to Lumbar Five  Laminectomy;  Surgeon: Judith Part, MD;  Location: [place];  Service: Neurosurgery;  Laterality: N/A;   LUMBAR WOUND DEBRIDEMENT N/A 08/20/2018   Procedure: POSTERIOR LUMBAR SPINAL WOUND DEBRIDEMENT AND REVISION;  Surgeon: Judith Part, MD;  Location: [place];  Service: Neurosurgery;  Laterality: N/A;  POSTERIOR LUMBAR SPINAL WOUND REVISION   miscarrage  1962   OVARIAN CYST SURGERY  1968   thymus tumor     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   TONSILLECTOMY     Past Surgical History:  Procedure Laterality Date   ABDOMINAL HYSTERECTOMY  1974   abdominal tumor  2002   APPENDECTOMY     APPLICATION OF A-CELL OF BACK N/A 09/26/2018   Procedure: With Acell;  Surgeon: Wallace Going, DO;  Location: [place];  Service: Plastics;  Laterality: N/A;   APPLICATION OF WOUND VAC N/A 09/26/2018   Procedure: Vac placement;  Surgeon: Wallace Going, DO;  Location: [place];  Service: Plastics;  Laterality: N/A;   BACK SURGERY  1982   BRONCHOSCOPY  2001   CATARACT EXTRACTION, BILATERAL  09/2019, 10/2019   CHOLECYSTECTOMY  1984   INCISION AND DRAINAGE OF WOUND N/A 09/26/2018   Procedure: Debridement of spine wound;  Surgeon: Wallace Going, DO;  Location: [place];  Service: Plastics;  Laterality: N/A;   KNEE SURGERY Bilateral 08/27/2010 (L) and 01/11/2011 (R)   LUMBAR LAMINECTOMY/DECOMPRESSION MICRODISCECTOMY N/A 07/03/2018   Procedure: Lumbar Three to Lumbar Five Laminectomy;  Surgeon: Judith Part, MD;  Location: [place];  Service: Neurosurgery;  Laterality: N/A;   LUMBAR WOUND DEBRIDEMENT N/A 08/20/2018   Procedure: POSTERIOR LUMBAR SPINAL WOUND DEBRIDEMENT AND REVISION;  Surgeon: Judith Part, MD;  Location: [place];  Service: Neurosurgery;  Laterality: N/A;  POSTERIOR LUMBAR SPINAL WOUND REVISION   miscarrage  1962   OVARIAN CYST SURGERY  1968   thymus tumor     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   TONSILLECTOMY     Past Medical History:  Diagnosis Date   Abnormality of gait 04/19/2016   Acute  infective polyneuritis ([place]) 2002   Allergic rhinitis due to pollen    Chronic pain syndrome    COPD (chronic obstructive pulmonary disease) (HCC)    chronic bronchitis   Depressive disorder, not elsewhere classified    Diabetes mellitus without complication ([place])    Diaphragmatic hernia without mention of obstruction or gangrene    Dyslipidemia    Fibromyalgia    GERD (gastroesophageal reflux disease)    with h/o esophagitis   Guillain-Barre syndrome ([place])    History of benign thymus tumor  Insomnia, unspecified    Lumbar spinal stenosis 03/30/2018   L4-5 level, severe   Miscarriage 1962   Mixed hyperlipidemia    Morbid obesity ([place])    Osteoporosis    Pneumonia    Polyneuropathy in diabetes(357.2)    Rheumatoid arthritis(714.0)    Spondylosis, lumbosacral    Spontaneous ecchymoses    Type II or unspecified type diabetes mellitus with peripheral circulatory disorders, uncontrolled(250.72)    Unspecified chronic bronchitis (HCC)    Unspecified essential hypertension    Unspecified hypothyroidism    Unspecified pruritic disorder    Unspecified urinary incontinence    BP 120/60   Pulse 73   Temp 98.4 F (36.9 C)   Ht 5\' 6"  (1.676 m)   Wt 212 lb 9.6 oz (96.4 kg)   SpO2 95%   BMI 34.31 kg/m   Opioid Risk Score:   Fall Risk Score:  `1  Depression screen PHQ 2/9  Depression screen [facility] 2/9 06/01/2021 04/27/2021 03/11/2021 02/10/2021 01/19/2021 10/15/2020 10/01/2020  Decreased Interest 1 0 0 0 0 0 0  Down, Depressed, Hopeless 1 0 0 0 0 0 0  PHQ - 2 Score 2 0 0 0 0 0 0  Altered sleeping - - - - - - -  Tired, decreased energy - - - - - - -  Change in appetite - - - - - - -  Feeling bad or failure about yourself  - - - - - - -  Trouble concentrating - - - - - - -  Moving slowly or fidgety/restless - - - - - - -  Suicidal thoughts - - - - - - -  PHQ-9 Score - - - - - - -  Difficult doing work/chores - - - - - - -  Some recent data might be hidden       Review of Systems   Constitutional: Negative.   HENT: Negative.    Eyes: Negative.   Respiratory: Negative.    Cardiovascular: Negative.   Gastrointestinal: Negative.   Endocrine: Negative.   Genitourinary: Negative.   Musculoskeletal:  Positive for back pain, gait problem and neck pain.       Pain in noth legs, pain  in right shoulder , pain in both knees , pain in both feet  Skin: Negative.   Allergic/Immunologic: Negative.   Hematological: Negative.   Psychiatric/Behavioral: Negative.        Objective:   Physical Exam Gen: no distress, normal appearing HEENT: oral mucosa pink and moist, hard of hearing Cardio: Reg rate Chest: normal effort, normal rate of breathing Abd: soft, non-distended Ext: no edema Psych: pleasant, normal affect Skin: intact Musculoskeletal:     Cervical back: Normal range of motion and neck supple.     Comments: Normal Muscle Bulk and Muscle Testing Reveals:  Upper Extremities: Full ROM and Muscle Strength 5/5 Bilateral AC Joint Tenderness Lumbar Paraspinal Tenderness: L-3-L-5 Left Greater Trochanter Tenderness Lower Extremities: Full ROM and Muscle Strength 5/5 Arrived in wheelchair     Skin:    General: Skin is warm and dry.  Neurological:     Mental Status: She is alert and oriented to person, place, and time.  Psychiatric:        Mood and Affect: Mood normal.        Behavior: Behavior normal.        Assessment & Plan:  Failed Back Syndrome: Coninue HEP as Tolerated. Continue to Monitor. She is receiving Home Health therapy at this time,  PT/OT two days a week. 03/11/2021 Lumbar Radiculitis: Continue Gabapentin. Continue to monitor. Continue HEP as Tolerated.03/11/2021 Left  Greater Trochanter Bursitis: Scheduled for left hip injection with Dr Ranell Patrick.Continue to Alternate Ice and Heat Therapy. Continue current medication regimen. Continue to monitor. 03/11/2021 Bilateral Primary OA: Continue HEP as Tolerated. Continue to Monitor. 03/11/2021 Insomnia:  Continue Amitriptyline . Continue to Monitor.03/11/2021. Discussed trying tart cherry juice Chronic Pain Syndrome: Refilled: Oxycodone 10/325 mg one tablet every 6 hours as needed for pain #120. We will continue the opioid monitoring program, this consists of regular clinic visits, examinations, urine drug screen, pill counts as well as use of [place] Controlled Substance Reporting system. Discussed the differences between oxycodone and oxycontin. Provided with a pain relief journal  -Discussed current symptoms of pain and history of pain.  -Discussed benefits of exercise in reducing pain. -Discussed following foods that may reduce pain: 1) Ginger (especially studied for arthritis)- reduce leukotriene production to decrease inflammation 2) Blueberries- high in phytonutrients that decrease inflammation 3) Salmon- marine omega-3s reduce joint swelling and pain 4) Pumpkin seeds- reduce inflammation 5) dark chocolate- reduces inflammation 6) turmeric- reduces inflammation 7) tart cherries - reduce pain and stiffness 8) extra virgin olive oil - its compound olecanthal helps to block prostaglandins  9) chili peppers- can be eaten or applied topically via capsaicin 10) mint- helpful for headache, muscle aches, joint pain, and itching 11) garlic- reduces inflammation  Link to further information on diet for chronic pain: http://www.randall.com/   Right hand decreased strength post-shingles: provided referral for occupational therapy  8. Right sided excruciating post-shingles pain -Discussed Qutenza as an option for neuropathic pain control. Discussed that this is a capsaicin patch, stronger than capsaicin cream. Discussed that it is currently approved for diabetic peripheral neuropathy and post-herpetic neuralgia, but that it has also shown benefit in treating other forms of neuropathy. Provided patient with link to site to  learn more about the patch: [employer]. Discussed that the patch would be placed in office and benefits usually last 3 months. Discussed that unintended exposure to capsaicin can cause severe irritation of eyes, mucous membranes, respiratory tract, and skin, but that Qutenza is a local treatment and does not have the systemic side effects of other nerve medications. Discussed that there may be pain, itching, erythema, and decreased sensory function associated with the application of Qutenza. Side effects usually subside within 1 week. A cold pack of analgesic medications can help with these side effects. Blood pressure can also be increased due to pain associated with administration of the patch.     F/U monthly with Zella Ball next 3 months, then me in 4 months.

## 2021-06-03 DIAGNOSIS — H6522 Chronic serous otitis media, left ear: Secondary | ICD-10-CM | POA: Diagnosis not present

## 2021-06-08 ENCOUNTER — Other Ambulatory Visit: Payer: Self-pay | Admitting: *Deleted

## 2021-06-08 DIAGNOSIS — E1169 Type 2 diabetes mellitus with other specified complication: Secondary | ICD-10-CM

## 2021-06-08 MED ORDER — EZETIMIBE 10 MG PO TABS: 10.0000 mg | ORAL_TABLET | Freq: Every day | ORAL | 3 refills | Status: AC

## 2021-06-08 NOTE — Telephone Encounter (Signed)
[facility] requested refill.  ? ?

## 2021-06-11 NOTE — Progress Notes (Signed)
NEUROLOGY CONSULTATION NOTE  Katherine Delgado MRN: [id number] DOB: 06/30/1941  Referring provider: Marlowe Sax, NP Primary care provider: Marlowe Sax, NP  Reason for consult:  right hand weakness  Assessment/Plan:   Post-herpetic neuralgia  Pain has subsided enough that I thinks she would benefit from occupational therapy.  Will send referral Follow up with pain management - consider increasing either gabapentin or amitriptyline Follow up with me as needed.   Subjective:  Katherine Delgado is an 80 year old right-handed female with rheumatoid arthritis, COPD, DM II, chronic pain syndrome and history of Guillain-Barre syndrome who presents for right hand weakness.  History supplemented by referring provider's note.  In August, she developed a rash in the right upper extremity, primarily the upper arm.  She had some associated pain as well. She went to the ED where she was diagnosed with shingles.  She underwent 2 rounds of valacylovir.  She continues to have pain mostly from the elbow down into the hand and fingers.  She has associated numbness and tingling as well.  Her hand feels tight and has difficulty flexing her fingers.  She has noticed some improvement in both pain and numbness, but still notes numbness in the last two digits of her right hand.  She has difficulty writing.  She has rheumatoid arthritis.  X-ray of right hand on 05/20/2021 personally reviewed showed severe periarticular osteopenia, multifocal joint space narrowing greatest at the first and second digits and diffuse soft tissue swelling.  X-ray right elbow negative.  She has chronic low back pain, arthritic pain and diabetic neuropathy for which she is treated by pain management.  She takes gabapentin and amitriptyline.   She has type 2 diabetes.  Hgb A1c from September was 5.9, down from 7.7 in June.  Current medications:  amitriptyline 25mg  QHS (higher dose caused daytime drowsiness) gabapentin 600mg  three times daily,  Lidoderm 5% patch  Past medications:  Lyrica  PAST MEDICAL HISTORY: Past Medical History:  Diagnosis Date   Abnormality of gait 04/19/2016   Acute infective polyneuritis ([place]) 2002   Allergic rhinitis due to pollen    Chronic pain syndrome    COPD (chronic obstructive pulmonary disease) (HCC)    chronic bronchitis   Depressive disorder, not elsewhere classified    Diabetes mellitus without complication ([place])    Diaphragmatic hernia without mention of obstruction or gangrene    Dyslipidemia    Fibromyalgia    GERD (gastroesophageal reflux disease)    with h/o esophagitis   Guillain-Barre syndrome (HCC)    History of benign thymus tumor    Insomnia, unspecified    Lumbar spinal stenosis 03/30/2018   L4-5 level, severe   Miscarriage 1962   Mixed hyperlipidemia    Morbid obesity ([place])    Osteoporosis    Pneumonia    Polyneuropathy in diabetes(357.2)    Rheumatoid arthritis(714.0)    Spondylosis, lumbosacral    Spontaneous ecchymoses    Type II or unspecified type diabetes mellitus with peripheral circulatory disorders, uncontrolled(250.72)    Unspecified chronic bronchitis (HCC)    Unspecified essential hypertension    Unspecified hypothyroidism    Unspecified pruritic disorder    Unspecified urinary incontinence     PAST SURGICAL HISTORY: Past Surgical History:  Procedure Laterality Date   ABDOMINAL HYSTERECTOMY  1974   abdominal tumor  2002   APPENDECTOMY     APPLICATION OF A-CELL OF BACK N/A 09/26/2018   Procedure: With Acell;  Surgeon: Wallace Going, DO;  Location:  [facility];  Service: Plastics;  Laterality: N/A;   APPLICATION OF WOUND VAC N/A 09/26/2018   Procedure: Vac placement;  Surgeon: Wallace Going, DO;  Location: [place];  Service: Plastics;  Laterality: N/A;   BACK SURGERY  1982   BRONCHOSCOPY  2001   CATARACT EXTRACTION, BILATERAL  09/2019, 10/2019   CHOLECYSTECTOMY  1984   INCISION AND DRAINAGE OF WOUND N/A 09/26/2018   Procedure: Debridement of spine  wound;  Surgeon: Wallace Going, DO;  Location: [place];  Service: Plastics;  Laterality: N/A;   KNEE SURGERY Bilateral 08/27/2010 (L) and 01/11/2011 (R)   LUMBAR LAMINECTOMY/DECOMPRESSION MICRODISCECTOMY N/A 07/03/2018   Procedure: Lumbar Three to Lumbar Five Laminectomy;  Surgeon: Judith Part, MD;  Location: [place];  Service: Neurosurgery;  Laterality: N/A;   LUMBAR WOUND DEBRIDEMENT N/A 08/20/2018   Procedure: POSTERIOR LUMBAR SPINAL WOUND DEBRIDEMENT AND REVISION;  Surgeon: Judith Part, MD;  Location: [place];  Service: Neurosurgery;  Laterality: N/A;  POSTERIOR LUMBAR SPINAL WOUND REVISION   miscarrage  1962   OVARIAN CYST SURGERY  1968   thymus tumor     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   TONSILLECTOMY      MEDICATIONS: Current Outpatient Medications on File Prior to Visit  Medication Sig Dispense Refill   acetaminophen (TYLENOL) 325 MG tablet Take 650 mg by mouth every 6 (six) hours as needed (for pain.).     amitriptyline (ELAVIL) 25 MG tablet TAKE 1 TABLET(25 MG) BY MOUTH AT BEDTIME 30 tablet 3   amLODipine (NORVASC) 10 MG tablet TAKE 1 TABLET(10 MG) BY MOUTH DAILY 90 tablet 1   aspirin EC 81 MG tablet Take 81 mg by mouth 2 (two) times a week.     calamine lotion Apply 1 application topically 3 (three) times daily. To affected areas on right fore arm 120 mL 0   cholecalciferol (VITAMIN D3) 25 MCG (1000 UT) tablet Take 1,000 Units by mouth daily.     Cranberry 475 MG CAPS Take 1 capsule (475 mg total) by mouth 2 (two) times daily. 60 capsule 3   docusate sodium (COLACE) 100 MG capsule Take 100 mg by mouth daily as needed for mild constipation.     DULoxetine (CYMBALTA) 20 MG capsule Take 1 capsule (20 mg total) by mouth daily. 30 capsule 3   ezetimibe (ZETIA) 10 MG tablet Take 1 tablet (10 mg total) by mouth daily. 90 tablet 3   fluconazole (DIFLUCAN) 150 MG tablet Take 150 mg by mouth once.     fluconazole (DIFLUCAN) 150 MG tablet Take one by mouth x 1 dose then repeat dose  in one week. 2 tablet 1   folic acid (FOLVITE) 1 MG tablet TAKE 2 TABLETS BY MOUTH  DAILY 180 tablet 3   furosemide (LASIX) 20 MG tablet TAKE 1 TABLET BY MOUTH TWICE DAILY AS NEEDED FOR 3 POUND WEIGHT GAIN IN ONE DAY OR 5 POUNDS IN ONE WEEK 60 tablet 0   gabapentin (NEURONTIN) 600 MG tablet Take 600 mg by mouth 2 (two) times daily.     insulin glargine, 1 Unit Dial, (TOUJEO SOLOSTAR) 300 UNIT/ML Solostar Pen Inject 90 Units into the skin daily in the afternoon. 30 mL 3   insulin lispro (HUMALOG KWIKPEN) 200 UNIT/ML KwikPen Max daily 80 units 45 mL 3   Insulin Pen Needle (B-D UF III MINI PEN NEEDLES) 31G X 5 MM MISC USE AS DIRECTED 100 each 1   ipratropium-albuterol (DUONEB) 0.5-2.5 (3) MG/3ML SOLN Take 3 mLs  by nebulization every 6 (six) hours as needed. 360 mL 1   ketoconazole (NIZORAL) 2 % cream Apply 1 application topically 2 (two) times daily. 60 g 1   levothyroxine (SYNTHROID) 112 MCG tablet Take 1 tablet (112 mcg total) by mouth daily. 90 tablet 3   lidocaine (LIDODERM) 5 % Place 1 patch onto the skin daily. Remove & Discard patch within 12 hours or as directed by MD 30 patch 0   liraglutide (VICTOZA) 18 MG/3ML SOPN Administer 1.8mg  under the skin daily for blood sugar. 27 mL 3   lisinopril (ZESTRIL) 20 MG tablet Take one tablet by mouth once daily. 90 tablet 1   loperamide (IMODIUM A-D) 2 MG tablet Take 2 mg by mouth 4 (four) times daily as needed for diarrhea or loose stools.     Melatonin 10 MG TABS Take 10 mg by mouth at bedtime as needed (sleep).     Menthol, Topical Analgesic, (ICY HOT MEDICATED SPRAY EX) Apply 1 application topically as needed (shoulder pain).     metoprolol tartrate (LOPRESSOR) 50 MG tablet TAKE 1 TABLET BY MOUTH TWICE DAILY 180 tablet 0   mirabegron ER (MYRBETRIQ) 50 MG TB24 tablet Take 1 tablet (50 mg total) by mouth daily. 30 tablet 5   Multiple Vitamins-Minerals (MULTIVITAMIN ADULT PO) Take 1 tablet by mouth daily.     nystatin (NYSTATIN) powder APPLY TOPICALLY  THREE TIMES DAILY AS DIRECTED FOR 14 DAYS and prn yeast infection 60 g 3   nystatin cream (MYCOSTATIN) Apply 1 application topically 3 (three) times daily. To skin folds 30 g 3   omega-3 acid ethyl esters (LOVAZA) 1 g capsule TAKE ONE CAPSULE BY MOUTH EVERY DAY 30 capsule 0   oxyCODONE-acetaminophen (PERCOCET) 10-325 MG tablet Take 1 tablet by mouth every 6 (six) hours as needed for pain. 120 tablet 0   polyvinyl alcohol (LIQUIFILM TEARS) 1.4 % ophthalmic solution Place 1 drop into both eyes 4 (four) times daily as needed (for dry/irritated eyes).      potassium chloride (MICRO-K) 10 MEQ CR capsule Take 2 capsules (20 mEq total) by mouth 2 (two) times daily as needed (when taking a dose of lasix.). 60 capsule 0   saccharomyces boulardii (FLORASTOR) 250 MG capsule Take 1 capsule (250 mg total) by mouth 2 (two) times daily. For 10 days. 20 capsule 0   sodium chloride (OCEAN) 0.65 % nasal spray Place 2 sprays into the nose as needed for congestion. 30 mL 1   tiotropium (SPIRIVA) 18 MCG inhalation capsule Place 18 mcg into inhaler and inhale as needed.     tiZANidine (ZANAFLEX) 2 MG tablet TAKE 1 TABLET(2 MG) BY MOUTH THREE TIMES DAILY 90 tablet 1   Tofacitinib Citrate (XELJANZ) 5 MG TABS Take 1 tablet (5 mg total) by mouth daily. 90 tablet 0   triamterene-hydrochlorothiazide (MAXZIDE-25) 37.5-25 MG tablet Take 1 tablet by mouth daily. 90 tablet 1   venlafaxine XR (EFFEXOR-XR) 75 MG 24 hr capsule TAKE 1 CAPSULE BY MOUTH EVERY DAY WITH BREAKFAST 90 capsule 1   No current facility-administered medications on file prior to visit.    ALLERGIES: Allergies  Allergen Reactions   Influenza Vaccines Other (See Comments)    "h/o Guillain Barre; dr's told me years ago never to take another flu shot as it could relapse Rosalee Kaufman; has to do with when vaccine being changed to H1N1 virus"   Penicillins Hives, Itching, Swelling and Rash    Has patient had a PCN reaction causing immediate rash,  facial/tongue/throat swelling, SOB or lightheadedness with hypotension:Unknown Has patient had a PCN reaction causing severe rash involving mucus membranes or skin necrosis: Unknown Has patient had a PCN reaction that required hospitalization:Unknown Has patient had a PCN reaction occurring within the last 10 years: Unknown If all of the above answers are "NO", then may proceed with Cephalosporin use.    Statins Other (See Comments)    Myopathy, transaminitis   Sulfamethoxazole-Trimethoprim Itching    FAMILY HISTORY: Family History  Problem Relation Age of Onset   Alzheimer's disease Mother    Heart disease Mother    Heart disease Father    Liver disease Father    Cancer Brother    Arthritis Son    Colon polyps Brother    Colon cancer Neg Hx    Esophageal cancer Neg Hx    Kidney disease Neg Hx    Stomach cancer Neg Hx    Rectal cancer Neg Hx     Objective:  Blood pressure (!) 131/58, pulse 75, height 5\' 6"  (1.676 m), weight 213 lb (96.6 kg), SpO2 97 %. General: No acute distress.  Patient appears well-groomed.   Head:  Normocephalic/atraumatic Eyes:  fundi examined but not visualized Neck: supple, no paraspinal tenderness, full range of motion Back: No paraspinal tenderness Heart: regular rate and rhythm Lungs: Clear to auscultation bilaterally. Vascular: No carotid bruits. Neurological Exam: Mental status: alert and oriented to person, place, and time, recent and remote memory intact, fund of knowledge intact, attention and concentration intact, speech fluent and not dysarthric, language intact. Cranial nerves: CN I: not tested CN II: pupils equal, round and reactive to light, visual fields intact CN III, IV, VI:  full range of motion, no nystagmus, no ptosis CN V: facial sensation intact. CN VII: upper and lower face symmetric CN VIII: hearing intact CN IX, X: gag intact, uvula midline CN XI: sternocleidomastoid and trapezius muscles intact CN XII: tongue  midline Bulk & Tone: normal, no fasciculations. Motor:  muscle strength 5/5 throughout Sensation:  Pinprick, temperature and vibratory sensation intact. Deep Tendon Reflexes:  2+ throughout,  toes downgoing.   Finger to nose testing:  Without dysmetria.   Heel to shin:  Without dysmetria.   Gait:  Normal station and stride.  Romberg negative.    Thank you for allowing me to take part in the care of this patient.  Metta Clines, DO  CC: Marlowe Sax, NP

## 2021-06-14 ENCOUNTER — Ambulatory Visit (INDEPENDENT_AMBULATORY_CARE_PROVIDER_SITE_OTHER): Payer: Medicare Other | Admitting: Neurology

## 2021-06-14 ENCOUNTER — Other Ambulatory Visit: Payer: Self-pay

## 2021-06-14 ENCOUNTER — Encounter: Payer: Self-pay | Admitting: Neurology

## 2021-06-14 VITALS — BP 131/58 | HR 75 | Ht 66.0 in | Wt 213.0 lb

## 2021-06-14 DIAGNOSIS — R29898 Other symptoms and signs involving the musculoskeletal system: Secondary | ICD-10-CM

## 2021-06-14 DIAGNOSIS — B0229 Other postherpetic nervous system involvement: Secondary | ICD-10-CM

## 2021-06-14 NOTE — Patient Instructions (Signed)
Will refer you to occupational therapy

## 2021-06-15 ENCOUNTER — Other Ambulatory Visit: Payer: Self-pay | Admitting: Family

## 2021-06-15 ENCOUNTER — Other Ambulatory Visit: Payer: Self-pay | Admitting: Physical Medicine and Rehabilitation

## 2021-06-15 DIAGNOSIS — B372 Candidiasis of skin and nail: Secondary | ICD-10-CM

## 2021-06-20 ENCOUNTER — Other Ambulatory Visit: Payer: Self-pay | Admitting: Physician Assistant

## 2021-06-21 ENCOUNTER — Encounter: Payer: Medicare Other | Admitting: Occupational Therapy

## 2021-06-22 ENCOUNTER — Other Ambulatory Visit: Payer: Self-pay | Admitting: Family

## 2021-06-23 ENCOUNTER — Telehealth: Payer: Self-pay

## 2021-06-23 ENCOUNTER — Other Ambulatory Visit: Payer: Self-pay

## 2021-06-23 MED ORDER — OXYCODONE-ACETAMINOPHEN 10-325 MG PO TABS: 1.0000 | ORAL_TABLET | Freq: Four times a day (QID) | ORAL | 0 refills | Status: DC | PRN

## 2021-06-24 ENCOUNTER — Ambulatory Visit: Payer: Medicare Other | Attending: Physical Medicine and Rehabilitation | Admitting: Occupational Therapy

## 2021-06-24 ENCOUNTER — Other Ambulatory Visit: Payer: Self-pay

## 2021-06-24 DIAGNOSIS — M79641 Pain in right hand: Secondary | ICD-10-CM | POA: Insufficient documentation

## 2021-06-24 DIAGNOSIS — R278 Other lack of coordination: Secondary | ICD-10-CM | POA: Diagnosis present

## 2021-06-24 DIAGNOSIS — M25641 Stiffness of right hand, not elsewhere classified: Secondary | ICD-10-CM | POA: Insufficient documentation

## 2021-06-24 DIAGNOSIS — M6281 Muscle weakness (generalized): Secondary | ICD-10-CM | POA: Diagnosis present

## 2021-06-24 DIAGNOSIS — R208 Other disturbances of skin sensation: Secondary | ICD-10-CM | POA: Insufficient documentation

## 2021-06-24 NOTE — Telephone Encounter (Signed)
Rx sent 

## 2021-06-24 NOTE — Therapy (Signed)
[facility] [address] [place], [place], [address] Phone: [phone]   Fax:  [phone]  Occupational Therapy Evaluation  Patient Details  Name: Katherine Delgado MRN: [id number] Date of Birth: [birth year] Referring Provider (OT): Dr. Metta Clines   Encounter Date: 06/24/2021   OT End of Session - 06/24/21 1505     Visit Number 1    Number of Visits 13    Date for OT Re-Evaluation 09/20/21    Authorization Type UHC MCR    OT Start Time 0930    OT Stop Time 1020    OT Time Calculation (min) 50 min    Activity Tolerance Patient tolerated treatment well    Behavior During Therapy [facility] for tasks assessed/performed             Past Medical History:  Diagnosis Date   Abnormality of gait 04/19/2016   Acute infective polyneuritis ([place]) 2002   Allergic rhinitis due to pollen    Chronic pain syndrome    COPD (chronic obstructive pulmonary disease) ([place])    chronic bronchitis   Depressive disorder, not elsewhere classified    Diabetes mellitus without complication ([place])    Diaphragmatic hernia without mention of obstruction or gangrene    Dyslipidemia    Fibromyalgia    GERD (gastroesophageal reflux disease)    with h/o esophagitis   Guillain-Barre syndrome ([place])    History of benign thymus tumor    Insomnia, unspecified    Lumbar spinal stenosis 03/30/2018   L4-5 level, severe   Miscarriage 1962   Mixed hyperlipidemia    Morbid obesity ([place])    Osteoporosis    Pneumonia    Polyneuropathy in diabetes(357.2)    Rheumatoid arthritis(714.0)    Spondylosis, lumbosacral    Spontaneous ecchymoses    Type II or unspecified type diabetes mellitus with peripheral circulatory disorders, uncontrolled(250.72)    Unspecified chronic bronchitis (HCC)    Unspecified essential hypertension    Unspecified hypothyroidism    Unspecified pruritic disorder    Unspecified urinary incontinence     Past Surgical History:   Procedure Laterality Date   ABDOMINAL HYSTERECTOMY  1974   abdominal tumor  2002   APPENDECTOMY     APPLICATION OF A-CELL OF BACK N/A 09/26/2018   Procedure: With Acell;  Surgeon: Wallace Going, DO;  Location: [facility];  Service: Plastics;  Laterality: N/A;   APPLICATION OF WOUND VAC N/A 09/26/2018   Procedure: Vac placement;  Surgeon: Wallace Going, DO;  Location: [facility];  Service: Plastics;  Laterality: N/A;   BACK SURGERY  1982   BRONCHOSCOPY  2001   CATARACT EXTRACTION, BILATERAL  09/2019, 10/2019   CHOLECYSTECTOMY  1984   INCISION AND DRAINAGE OF WOUND N/A 09/26/2018   Procedure: Debridement of spine wound;  Surgeon: Wallace Going, DO;  Location: [facility];  Service: Plastics;  Laterality: N/A;   KNEE SURGERY Bilateral 08/27/2010 (L) and 01/11/2011 (R)   LUMBAR LAMINECTOMY/DECOMPRESSION MICRODISCECTOMY N/A 07/03/2018   Procedure: Lumbar Three to Lumbar Five Laminectomy;  Surgeon: Judith Part, MD;  Location: [facility];  Service: Neurosurgery;  Laterality: N/A;   LUMBAR WOUND DEBRIDEMENT N/A 08/20/2018   Procedure: POSTERIOR LUMBAR SPINAL WOUND DEBRIDEMENT AND REVISION;  Surgeon: Judith Part, MD;  Location: [facility];  Service: Neurosurgery;  Laterality: N/A;  POSTERIOR LUMBAR SPINAL WOUND REVISION   miscarrage  1962   OVARIAN CYST SURGERY  1968   thymus tumor     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   TONSILLECTOMY      There were no vitals filed for this visit.   Subjective Assessment - 06/24/21 0941     Pertinent History post-herpetic neuralgia (shingles in Aug) w/ Rt hand weakness. Pt also has RA, COPD, T2DM, chronic pain syndrome, diabetic neuropathy, h/o Gillian-Barre years ago w/ full covery    Currently in Pain? Yes    Pain Score 7     Pain Location Hand   to elbow   Pain Orientation Right    Pain Descriptors / Indicators Shooting;Tingling    Pain Type Neuropathic pain    Pain Onset More than a month ago    Pain Frequency Constant    Aggravating Factors  cold  weather, use    Pain Relieving Factors pain meds               [facility] OT Assessment - 06/24/21 0001       Assessment   Medical Diagnosis post-herpetic neuralgia (Shingles in August)    Referring Provider (OT) Dr. Metta Clines    Onset Date/Surgical Date --   August   Hand Dominance Right    Prior Therapy none for this      Precautions   Precaution Comments neuropathic pain      Restrictions   Weight Bearing Restrictions No      Balance Screen   Has the patient fallen in the past 6 months No      Home  Environment   Bathroom Shower/Tub Tub/Shower unit   grab bars, shower chair   Additional Comments Pt lives in 1 story home, with 3 steps to enter    Lives With Son      Prior Function   Level of Independence --   Mod I for [employer] Retired      ADL   Eating/Feeding Modified independent   Using Lt non dominant hand   Grooming Modified independent   using Lt non dominant hand   Upper Body Bathing Modified independent    Lower Body Bathing Modified independent    Upper Body Dressing Minimal assistance   for fastening bra, buttons   Lower Body Dressing Moderate assistance   wears depends, assist for shoes and socks   Toilet Transfer Modified independent    Toileting - Clothing Manipulation Modified independent   with difficulty   Toileting -  Hygiene Modified Independent    Tub/Shower Transfer Supervision/safety    ADL comments Son does all cooking and cleaning, however pt has washed some dishes. Pt hasn't driven since back surgeries a few years ago      Mobility   Mobility Status Comments w/c for mobily, only walks very short distances to transfer      Written Expression   Dominant Hand Right    Handwriting 75% legible   cursive, name only     Vision - History   Baseline Vision Wears glasses only for reading    Additional Comments cataract surgeries      Observation/Other Assessments   Observations Lt ring finger RA, noted arthritis and MP drift Rt hand  with joint swelling, noted lacks MP extension Rt hand (? radial n. damage vs. arthritis)      Sensation   Additional Comments numbness/tingling along ulnar n. distribution of Rt hand,      Coordination   9 Hole Peg Test Right;Left    Right 9 Hole Peg Test 36.50 sec    Left 9 Hole Peg Test 27.12 sec  Edema   Edema joint swelling Rt hand and Lt ring finger      ROM / Strength   AROM / PROM / Strength AROM      AROM   Overall AROM Comments BUE AROM WFL's except Rt hand MP extension, and Lt wrist (d/t old injury, fusion?)      Hand Function   Right Hand Grip (lbs) 12    Right Hand Lateral Pinch 1 lbs    Right Hand 3 Point Pinch 0 lbs    Left Hand Grip (lbs) 35    Left Hand Lateral Pinch 9 lbs    Left 3 point pinch 10 lbs                                OT Short Term Goals - 06/24/21 1513       OT SHORT TERM GOAL #1   Title Independent with coordination and putty HEP for Rt hand    Time 4    Period Weeks    Status New      OT SHORT TERM GOAL #2   Title Pt to eat and groom 50% of time with Rt dominant hand    Time 4    Period Weeks    Status New      OT SHORT TERM GOAL #3   Title Pt to write name and address with 90% legibility    Baseline 75%    Time 4    Period Weeks    Status New      OT SHORT TERM GOAL #4   Title Pt to verbalize understanding with A/E needs to increase independence with ADLS (button hook, sock aide, LH shoe horn, LH sponge, etc)    Time 4    Period Weeks    Status New      OT SHORT TERM GOAL #5   Title Pt to demo full MP extension Rt hand    Time 4    Period Weeks    Status New               OT Long Term Goals - 06/24/21 1515       OT LONG TERM GOAL #1   Title Grip strength Rt hand to be 20 lbs or greater    Baseline 12 lbs    Time 12    Period Weeks    Status New      OT LONG TERM GOAL #2   Title Rt lateral and tip pinch to improve to 5 lbs for pinching tasks    Baseline lat = 1 lb, 3 tip = 0     Time 12    Period Weeks    Status New      OT LONG TERM GOAL #3   Title Pt to eat 90% w/ Rt dominant hand, and groom 75% with Rt dominant hand    Time 12    Period Weeks    Status New      OT LONG TERM GOAL #4   Title Pt to be modified independence with dressing using A/E and task modifications prn    Time 12    Period Weeks    Status New      OT LONG TERM GOAL #5   Title Pt to report pain overall 3/10 or under Rt elbow/hand    Baseline 7/10    Time 12    Period Weeks  Status New      Long Term Additional Goals   Additional Long Term Goals Yes      OT LONG TERM GOAL #6   Title Improve coordination Rt hand as evidenced by reducing speed on 9 hole peg test to 28 sec or under    Baseline 36.5 sec    Time 12    Period Weeks    Status New                   Plan - 06/24/21 1506     Clinical Impression Statement Pt is an 80 y.o. female who presents to [facility] w/ post-herpetic neuralgia RUE (elbow to hand, mostly hand now) from shingles in Aug 2022. Pain complicated from RA, chronic pain syndrome, and diabetic neuropathy. Pt also mostly w/c bound. Pt would benefit from O.T. to address deficits in pain RUE, strength, coordination, and decreased MP extension Rt hand as well as to increase independence and safety with ADLS.    OT Occupational Profile and History Detailed Assessment- Review of Records and additional review of physical, cognitive, psychosocial history related to current functional performance    Occupational performance deficits (Please refer to evaluation for details): ADL's;IADL's;Social Participation    Body Structure / Function / Physical Skills ADL;Strength;Pain;GMC;Body mechanics;Edema;UE functional use;IADL;ROM;Flexibility;Coordination;Sensation;FMC;Decreased knowledge of use of DME    Rehab Potential Good    Clinical Decision Making Several treatment options, min-mod task modification necessary    Comorbidities Affecting Occupational Performance:  Presence of comorbidities impacting occupational performance    Comorbidities impacting occupational performance description: RA, chronic pain syndrome, diabetic neuropathy    Modification or Assistance to Complete Evaluation  Min-Moderate modification of tasks or assist with assess necessary to complete eval    OT Frequency 1x / week    OT Duration 12 weeks   (plus eval) anticipate 8 weeks   OT Treatment/Interventions Self-care/ADL training;Moist Heat;Fluidtherapy;DME and/or AE instruction;Splinting;Therapeutic activities;Compression bandaging;Ultrasound;Therapeutic exercise;Coping strategies training;Passive range of motion;Functional Mobility Training;Electrical Stimulation;Energy conservation;Manual Therapy;Patient/family education    Plan issue foam for writing and eating utensils, putty HEP for Rt hand, coordination HEP for Rt hand    Consulted and Agree with Plan of Care Patient             Patient will benefit from skilled therapeutic intervention in order to improve the following deficits and impairments:   Body Structure / Function / Physical Skills: ADL, Strength, Pain, GMC, Body mechanics, Edema, UE functional use, IADL, ROM, Flexibility, Coordination, Sensation, FMC, Decreased knowledge of use of DME       Visit Diagnosis: Pain in right hand  Stiffness of right hand, not elsewhere classified  Muscle weakness (generalized)  Other lack of coordination  Other disturbances of skin sensation    Problem List Patient Active Problem List   Diagnosis Date Noted   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[place]) 02/10/2020   Primary osteoarthritis of right knee 07/25/2019   Wound dehiscence 08/17/2018   Lumbar spinal stenosis 03/30/2018   Rheumatoid arthritis involving both wrists with positive rheumatoid factor ([place]) 06/14/2016   High risk medication use 06/14/2016   Sjogren's syndrome ([place]) 06/14/2016   Primary osteoarthritis of both knees 06/14/2016   DDD (degenerative disc  disease), lumbar 06/14/2016   Hypothyroidism 06/14/2016   Osteoporosis 06/14/2016   Abnormality of gait 04/19/2016   Type 2 diabetes mellitus with diabetic polyneuropathy, with long-term current use of insulin ([place]) 11/27/2015   Diabetes mellitus without complication ([place]) 36/14/4315   Headache(784.0) 07/31/2013   Sinus infection  07/31/2013   Chronic right-sided low back pain with right-sided sciatica 03/14/2013   Insomnia 12/06/2012   Nausea alone 12/06/2012   Diarrhea 12/06/2012   Urinary incontinence, urge 12/06/2012   Lumbosacral root lesions, not elsewhere classified 11/27/2012   Diabetic polyneuropathy associated with type 2 diabetes mellitus ([place]) 11/27/2012   EDEMA 05/04/2010   YEAST INFECTION 05/03/2010   Hyperlipidemia 05/03/2010   DEPRESSION 05/03/2010   History of peripheral neuropathy 05/03/2010   Essential hypertension 05/03/2010   ALLERGIC RHINITIS 05/03/2010   PNEUMONIA 05/03/2010   COPD (chronic obstructive pulmonary disease) ([place]) 05/03/2010   GERD 05/03/2010   Fibromyalgia 05/03/2010   DYSPNEA 05/03/2010   CHEST PAIN 05/03/2010    Carey Bullocks, OTR/L 06/24/2021, 3:25 PM  [facility] [address] [place], [place], [address] Phone: [phone]   Fax:  [phone]  Name: Katherine Delgado MRN: [id number] Date of Birth: 1941/06/21

## 2021-06-29 ENCOUNTER — Encounter: Payer: Self-pay | Admitting: Registered Nurse

## 2021-06-29 ENCOUNTER — Encounter: Payer: Medicare Other | Attending: Physical Medicine and Rehabilitation | Admitting: Registered Nurse

## 2021-06-29 ENCOUNTER — Other Ambulatory Visit: Payer: Self-pay

## 2021-06-29 VITALS — BP 125/71 | HR 76 | Temp 98.0°F | Ht 66.0 in | Wt 214.0 lb

## 2021-06-29 DIAGNOSIS — M170 Bilateral primary osteoarthritis of knee: Secondary | ICD-10-CM | POA: Insufficient documentation

## 2021-06-29 DIAGNOSIS — M961 Postlaminectomy syndrome, not elsewhere classified: Secondary | ICD-10-CM | POA: Diagnosis present

## 2021-06-29 DIAGNOSIS — G8929 Other chronic pain: Secondary | ICD-10-CM | POA: Diagnosis present

## 2021-06-29 DIAGNOSIS — G894 Chronic pain syndrome: Secondary | ICD-10-CM | POA: Diagnosis not present

## 2021-06-29 DIAGNOSIS — M25511 Pain in right shoulder: Secondary | ICD-10-CM | POA: Diagnosis not present

## 2021-06-29 DIAGNOSIS — Z79891 Long term (current) use of opiate analgesic: Secondary | ICD-10-CM | POA: Insufficient documentation

## 2021-06-29 DIAGNOSIS — M7061 Trochanteric bursitis, right hip: Secondary | ICD-10-CM | POA: Insufficient documentation

## 2021-06-29 DIAGNOSIS — R531 Weakness: Secondary | ICD-10-CM | POA: Diagnosis not present

## 2021-06-29 DIAGNOSIS — M7062 Trochanteric bursitis, left hip: Secondary | ICD-10-CM | POA: Diagnosis not present

## 2021-06-29 DIAGNOSIS — M25512 Pain in left shoulder: Secondary | ICD-10-CM | POA: Diagnosis not present

## 2021-06-29 DIAGNOSIS — Z5181 Encounter for therapeutic drug level monitoring: Secondary | ICD-10-CM | POA: Diagnosis not present

## 2021-06-29 DIAGNOSIS — M4801 Spinal stenosis, occipito-atlanto-axial region: Secondary | ICD-10-CM | POA: Diagnosis not present

## 2021-06-29 NOTE — Progress Notes (Signed)
Subjective:    Patient ID: Katherine Delgado, female    DOB: 12-11-1940, 80 y.o.   MRN: [id number]  HPI: Katherine Delgado is a 80 y.o. female who returns for follow up appointment for chronic pain and medication refill. She states her  pain is located in her bilateral shoulders, lower back pain, bilateral hip pain and bilateral knee pain. Also reports she has bilateral feet pain and generalized joint pain. [name] reports her pain is not being controlled with current medication regimen and she's  only receiving 1 hour of relief of her pain. Reviewed Dr Ranell Patrick note and discussed with [name] in detail regarding her medication and this will be discussed with Dr Ranell Patrick she verbalizes understanding. Her son was in the room and started speaking to this provider in a loud tone and began to holler , stating he is with his mother everyday and her pain is not being controlled. He asked why his mother oxycodone has tylenol in it, when this provider tried to explain, he became very upset and hollering at this provider.  This provider spoke with [name] and the above actions was discussed with Dr Ranell Patrick and our manager Zorita Pang. No change to her medication, she will remain on the same medication.   She rates her pain 8. Her current exercise regime is walking short distances with walker or cane in the home she reports.   [name] Morphine equivalent is 60.00 MME.   Oral Swab was Performed today.      Pain Inventory Average Pain 8 Pain Right Now 8 My pain is constant, sharp, burning, dull, stabbing, and aching  In the last 24 hours, has pain interfered with the following? General activity 10 Relation with others 10 Enjoyment of life 10 What TIME of day is your pain at its worst? morning  and night Sleep (in general) Fair  Pain is worse with: walking, bending, sitting, standing, and some activites Pain improves with: rest, heat/ice, therapy/exercise, pacing activities, and  medication Relief from Meds: 5  Family History  Problem Relation Age of Onset   Alzheimer's disease Mother    Heart disease Mother    Heart disease Father    Liver disease Father    Cancer Brother    Arthritis Son    Colon polyps Brother    Colon cancer Neg Hx    Esophageal cancer Neg Hx    Kidney disease Neg Hx    Stomach cancer Neg Hx    Rectal cancer Neg Hx    Social History   Socioeconomic History   Marital status: Widowed    Spouse name: Not on file   Number of children: 2   Years of education: 14   Highest education level: Not on file  Occupational History   Occupation: Retired  Tobacco Use   Smoking status: Former    Packs/day: 0.10    Years: 10.00    Pack years: 1.00    Types: Cigarettes    Quit date: 12/07/1979    Years since quitting: 41.5   Smokeless tobacco: Never  Vaping Use   Vaping Use: Never used  Substance and Sexual Activity   Alcohol use: No    Alcohol/week: 0.0 standard drinks   Drug use: No   Sexual activity: Not Currently  Other Topics Concern   Not on file  Social History Narrative   Walks with cane   Right handed    Caffeine use: Coffee (2 cups every morning)  Tea: sometimes   Soda: none   Social Determinants of Radio broadcast assistant Strain: Not on file  Food Insecurity: Not on file  Transportation Needs: Not on file  Physical Activity: Not on file  Stress: Not on file  Social Connections: Not on file   Past Surgical History:  Procedure Laterality Date   ABDOMINAL HYSTERECTOMY  1974   abdominal tumor  2002   APPENDECTOMY     APPLICATION OF A-CELL OF BACK N/A 09/26/2018   Procedure: With Acell;  Surgeon: Wallace Going, DO;  Location: [facility];  Service: Plastics;  Laterality: N/A;   APPLICATION OF WOUND VAC N/A 09/26/2018   Procedure: Vac placement;  Surgeon: Wallace Going, DO;  Location: [facility];  Service: Plastics;  Laterality: N/A;   BACK SURGERY  1982   BRONCHOSCOPY  2001   CATARACT EXTRACTION, BILATERAL   09/2019, 10/2019   CHOLECYSTECTOMY  1984   INCISION AND DRAINAGE OF WOUND N/A 09/26/2018   Procedure: Debridement of spine wound;  Surgeon: Wallace Going, DO;  Location: [facility];  Service: Plastics;  Laterality: N/A;   KNEE SURGERY Bilateral 08/27/2010 (L) and 01/11/2011 (R)   LUMBAR LAMINECTOMY/DECOMPRESSION MICRODISCECTOMY N/A 07/03/2018   Procedure: Lumbar Three to Lumbar Five Laminectomy;  Surgeon: Judith Part, MD;  Location: [facility];  Service: Neurosurgery;  Laterality: N/A;   LUMBAR WOUND DEBRIDEMENT N/A 08/20/2018   Procedure: POSTERIOR LUMBAR SPINAL WOUND DEBRIDEMENT AND REVISION;  Surgeon: Judith Part, MD;  Location: [facility];  Service: Neurosurgery;  Laterality: N/A;  POSTERIOR LUMBAR SPINAL WOUND REVISION   miscarrage  1962   OVARIAN CYST SURGERY  1968   thymus tumor     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   TONSILLECTOMY     Past Surgical History:  Procedure Laterality Date   ABDOMINAL HYSTERECTOMY  1974   abdominal tumor  2002   APPENDECTOMY     APPLICATION OF A-CELL OF BACK N/A 09/26/2018   Procedure: With Acell;  Surgeon: Wallace Going, DO;  Location: [facility];  Service: Plastics;  Laterality: N/A;   APPLICATION OF WOUND VAC N/A 09/26/2018   Procedure: Vac placement;  Surgeon: Wallace Going, DO;  Location: [facility];  Service: Plastics;  Laterality: N/A;   BACK SURGERY  1982   BRONCHOSCOPY  2001   CATARACT EXTRACTION, BILATERAL  09/2019, 10/2019   CHOLECYSTECTOMY  1984   INCISION AND DRAINAGE OF WOUND N/A 09/26/2018   Procedure: Debridement of spine wound;  Surgeon: Wallace Going, DO;  Location: [facility];  Service: Plastics;  Laterality: N/A;   KNEE SURGERY Bilateral 08/27/2010 (L) and 01/11/2011 (R)   LUMBAR LAMINECTOMY/DECOMPRESSION MICRODISCECTOMY N/A 07/03/2018   Procedure: Lumbar Three to Lumbar Five Laminectomy;  Surgeon: Judith Part, MD;  Location: [facility];  Service: Neurosurgery;  Laterality: N/A;   LUMBAR WOUND DEBRIDEMENT N/A 08/20/2018   Procedure:  POSTERIOR LUMBAR SPINAL WOUND DEBRIDEMENT AND REVISION;  Surgeon: Judith Part, MD;  Location: [facility];  Service: Neurosurgery;  Laterality: N/A;  POSTERIOR LUMBAR SPINAL WOUND REVISION   miscarrage  1962   OVARIAN CYST SURGERY  1968   thymus tumor     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   TONSILLECTOMY     Past Medical History:  Diagnosis Date   Abnormality of gait 04/19/2016   Acute infective polyneuritis ([place]) 2002   Allergic rhinitis due to pollen    Chronic pain syndrome    COPD (chronic obstructive pulmonary disease) ([facility])    chronic  bronchitis   Depressive disorder, not elsewhere classified    Diabetes mellitus without complication ([place])    Diaphragmatic hernia without mention of obstruction or gangrene    Dyslipidemia    Fibromyalgia    GERD (gastroesophageal reflux disease)    with h/o esophagitis   Guillain-Barre syndrome ([facility])    History of benign thymus tumor    Insomnia, unspecified    Lumbar spinal stenosis 03/30/2018   L4-5 level, severe   Miscarriage 1962   Mixed hyperlipidemia    Morbid obesity ([place])    Osteoporosis    Pneumonia    Polyneuropathy in diabetes(357.2)    Rheumatoid arthritis(714.0)    Spondylosis, lumbosacral    Spontaneous ecchymoses    Type II or unspecified type diabetes mellitus with peripheral circulatory disorders, uncontrolled(250.72)    Unspecified chronic bronchitis ([facility])    Unspecified essential hypertension    Unspecified hypothyroidism    Unspecified pruritic disorder    Unspecified urinary incontinence    There were no vitals taken for this visit.  Opioid Risk Score:   Fall Risk Score:  `1  Depression screen PHQ 2/9  Depression screen [facility] 2/9 06/01/2021 04/27/2021 03/11/2021 02/10/2021 01/19/2021 10/15/2020 10/01/2020  Decreased Interest 1 0 0 0 0 0 0  Down, Depressed, Hopeless 1 0 0 0 0 0 0  PHQ - 2 Score 2 0 0 0 0 0 0  Altered sleeping - - - - - - -  Tired, decreased energy - - - - - - -  Change in appetite - - - - - - -  Feeling bad  or failure about yourself  - - - - - - -  Trouble concentrating - - - - - - -  Moving slowly or fidgety/restless - - - - - - -  Suicidal thoughts - - - - - - -  PHQ-9 Score - - - - - - -  Difficult doing work/chores - - - - - - -  Some recent data might be hidden    Review of Systems  Musculoskeletal:  Positive for arthralgias, back pain and gait problem.       Pain in joints & feet  All other systems reviewed and are negative.     Objective:   Physical Exam Vitals and nursing note reviewed.  Constitutional:      Appearance: Normal appearance.  Cardiovascular:     Rate and Rhythm: Normal rate and regular rhythm.  Pulmonary:     Effort: Pulmonary effort is normal.     Breath sounds: Normal breath sounds.  Musculoskeletal:     Cervical back: Normal range of motion and neck supple.     Right lower leg: Edema present.     Left lower leg: Edema present.     Comments: Normal Muscle Bulk and Muscle Testing Reveals:  Upper Extremities: Right Upper Extremity: Decreased ROM 90 Degrees and Muscle Strength 3/5 Left Upper Extremity: Full ROM and Muscle Strength 4/5 Right AC Joint Tenderness  Thoracic Paraspinal Tenderness: T-7-T-9 Mainly Right Side Lumbar Paraspinal Tenderness: L-3-L-5 Lower Extremities: Decreased ROM and Muscle Strength 4/5 Arrived in wheelchair      Skin:    General: Skin is warm and dry.  Neurological:     Mental Status: She is alert and oriented to person, place, and time.  Psychiatric:        Mood and Affect: Mood normal.        Behavior: Behavior normal.  Assessment & Plan:  Failed Back Syndrome: Continue HEP as Tolerated. Continue to Monitor. She is receiving Home Health therapy at this time, PT/OT two days a week. 06/29/2021 Lumbar Radiculitis: Continue Gabapentin. Continue to monitor. Continue HEP as Tolerated.06/29/2021 Bilateral  Greater Trochanter Bursitis: .Continue to Alternate Ice and Heat Therapy. Continue current medication regimen.  Continue to monitor. 06/29/2021 Bilateral Primary OA: Continue HEP as Tolerated. Continue to Monitor. 06/29/2021 Insomnia: Continue Amitriptyline . Continue to Monitor.06/29/2021 Chronic Pain Syndrome: Refilled: Oxycodone 10/325 mg one tablet every 6 hours as needed for pain #120. We will continue the opioid monitoring program, this consists of regular clinic visits, examinations, urine drug screen, pill counts as well as use of [place] Controlled Substance Reporting system. A 12 month History has been reviewed on the [place] Controlled Substance Reporting System on 06/29/2021.   F/U in 1 month

## 2021-06-30 MED ORDER — OXYCODONE-ACETAMINOPHEN 10-325 MG PO TABS: 1.0000 | ORAL_TABLET | Freq: Four times a day (QID) | ORAL | 0 refills | Status: DC | PRN

## 2021-07-03 LAB — DRUG TOX MONITOR 1 W/CONF, ORAL FLD
Alprazolam: NEGATIVE ng/mL (ref <0.50)
Amphetamines: NEGATIVE ng/mL (ref <10)
Barbiturates: NEGATIVE ng/mL (ref <10)
Benzodiazepines: NEGATIVE ng/mL (ref <0.50)
Buprenorphine: NEGATIVE ng/mL (ref <0.10)
Chlordiazepoxide: NEGATIVE ng/mL (ref <0.50)
Clonazepam: NEGATIVE ng/mL (ref <0.50)
Cocaine: NEGATIVE ng/mL (ref <5.0)
Codeine: NEGATIVE ng/mL (ref <2.5)
Diazepam: NEGATIVE ng/mL (ref <0.50)
Dihydrocodeine: NEGATIVE ng/mL (ref <2.5)
Fentanyl: NEGATIVE ng/mL (ref <0.10)
Flunitrazepam: NEGATIVE ng/mL (ref <0.50)
Heroin Metabolite: NEGATIVE ng/mL (ref <1.0)
Hydrocodone: NEGATIVE ng/mL (ref <2.5)
Hydromorphone: NEGATIVE ng/mL (ref <2.5)
Lorazepam: NEGATIVE ng/mL (ref <0.50)
MARIJUANA: NEGATIVE ng/mL (ref <2.5)
MDMA: NEGATIVE ng/mL (ref <10)
Meprobamate: NEGATIVE ng/mL (ref <2.5)
Methadone: NEGATIVE ng/mL (ref <5.0)
Midazolam: NEGATIVE ng/mL (ref <0.50)
Morphine: NEGATIVE ng/mL (ref <2.5)
Nicotine Metabolite: NEGATIVE ng/mL (ref <5.0)
Nordiazepam: NEGATIVE ng/mL (ref <0.50)
Norhydrocodone: NEGATIVE ng/mL (ref <2.5)
Noroxycodone: 100.9 ng/mL — ABNORMAL HIGH (ref <2.5)
Opiates: POSITIVE ng/mL — AB (ref <2.5)
Oxycodone: >250 ng/mL — ABNORMAL HIGH (ref <2.5)
Oxymorphone: NEGATIVE ng/mL (ref <2.5)
Phencyclidine: NEGATIVE ng/mL (ref <10)
Tapentadol: NEGATIVE ng/mL (ref <5.0)
Temazepam: NEGATIVE ng/mL (ref <0.50)
Tramadol: NEGATIVE ng/mL (ref <5.0)
Triazolam: NEGATIVE ng/mL (ref <0.50)
Zolpidem: NEGATIVE ng/mL (ref <5.0)

## 2021-07-03 LAB — DRUG TOX ALC METAB W/CON, ORAL FLD: Alcohol Metabolite: NEGATIVE ng/mL (ref <25)

## 2021-07-05 ENCOUNTER — Ambulatory Visit: Payer: Medicare Other | Admitting: Occupational Therapy

## 2021-07-06 DIAGNOSIS — Z794 Long term (current) use of insulin: Secondary | ICD-10-CM | POA: Diagnosis not present

## 2021-07-06 DIAGNOSIS — E1142 Type 2 diabetes mellitus with diabetic polyneuropathy: Secondary | ICD-10-CM | POA: Diagnosis not present

## 2021-07-07 ENCOUNTER — Telehealth: Payer: Self-pay | Admitting: *Deleted

## 2021-07-07 NOTE — Telephone Encounter (Signed)
Oral swab drug screen was consistent for prescribed medications.  ?

## 2021-07-12 ENCOUNTER — Ambulatory Visit: Payer: Medicare Other | Admitting: Occupational Therapy

## 2021-07-13 ENCOUNTER — Other Ambulatory Visit: Payer: Self-pay | Admitting: Orthopedic Surgery

## 2021-07-13 ENCOUNTER — Other Ambulatory Visit: Payer: Self-pay | Admitting: Family

## 2021-07-13 DIAGNOSIS — B372 Candidiasis of skin and nail: Secondary | ICD-10-CM

## 2021-07-13 MED ORDER — NYSTATIN 100000 UNIT/GM EX POWD: 1.0000 "application " | Freq: Three times a day (TID) | CUTANEOUS | 3 refills | Status: DC

## 2021-07-13 NOTE — Telephone Encounter (Signed)
Called patient and she states that this issue never resolved from a month ago. Patient states that she should have called and said something. Patient states she is fine with having Nystatin Powder sent in for now. Patient states she will keep upcoming appointment on 08/17/2021 and discuss issues with PCP Ngetich, Nelda Bucks, NP for further evaluation. Message routed back to Windell Moulding, NP.

## 2021-07-13 NOTE — Telephone Encounter (Signed)
Nystatin powder prescription sent to pharmacy.

## 2021-07-13 NOTE — Telephone Encounter (Signed)
Noted  

## 2021-07-13 NOTE — Telephone Encounter (Signed)
Patient has request refill on medication "Fluconazole". Patient last refill was 05/20/2021. Patient medication has warnings. Patient medication pend and sent to Windell Moulding, NP due to PCP Ngetich, Nelda Bucks, NP being out of office. Please Advise.

## 2021-07-13 NOTE — Telephone Encounter (Signed)
Last prescribed over a month ago. She will need to be seen. I can prescribe nystatin powder if she would like until appointment.

## 2021-07-21 ENCOUNTER — Ambulatory Visit: Payer: Medicare Other | Admitting: Occupational Therapy

## 2021-07-22 ENCOUNTER — Other Ambulatory Visit: Payer: Self-pay | Admitting: Family

## 2021-07-22 DIAGNOSIS — E1151 Type 2 diabetes mellitus with diabetic peripheral angiopathy without gangrene: Secondary | ICD-10-CM

## 2021-07-22 NOTE — Telephone Encounter (Signed)
Patient has request refill on medications "Lisinopril" and "Metoprolol". Patient medications have Warnings. Medication pend and sent to Ok Edwards, NP due to PCP Ngetich, Nelda Bucks, NP being out of office. Please Advise.

## 2021-07-26 ENCOUNTER — Ambulatory Visit: Payer: Medicare Other | Admitting: Occupational Therapy

## 2021-07-27 NOTE — Therapy (Signed)
[facility] [address] [place], [place], [address] Phone: [phone]   Fax:  [phone]  Patient Details  Name: Katherine Delgado MRN: [id number] Date of Birth: [birth year] Referring Provider:  Dr. Metta Clines  Encounter Date: 07/27/2021  Pt has cancelled several times since evaluation. Pt called 07/26/21 and cancelled remaining appointments due to not feeling well. She will seek a new referral for O.T. when feeling better.   Carey Bullocks, OTR/L 07/27/2021, 8:17 AM  [facility] [address] [place], [place], [address] Phone: [phone]   Fax:  [phone]

## 2021-07-28 ENCOUNTER — Telehealth: Payer: Self-pay | Admitting: *Deleted

## 2021-07-28 DIAGNOSIS — B372 Candidiasis of skin and nail: Secondary | ICD-10-CM

## 2021-07-28 NOTE — Telephone Encounter (Signed)
Diflucan 150 mg tablet by mouth x 1 dose then repeat x 1 dose in one week.recommend dermatology evaluation.

## 2021-07-28 NOTE — Telephone Encounter (Signed)
Patient called and stated that she has yeast under her breast and groin area. Itching and Redness. On and off for the last 2 years and has tried cream, powders and pills and it never completely goes away.   Requesting something to be called into pharmacy. Stated that you have already seen this and she does not want to come in for an appointment.   Please Advise.

## 2021-07-29 DIAGNOSIS — M4801 Spinal stenosis, occipito-atlanto-axial region: Secondary | ICD-10-CM | POA: Diagnosis not present

## 2021-07-29 DIAGNOSIS — R531 Weakness: Secondary | ICD-10-CM | POA: Diagnosis not present

## 2021-07-29 MED ORDER — FLUCONAZOLE 150 MG PO TABS: ORAL_TABLET | ORAL | 1 refills | Status: DC

## 2021-07-29 NOTE — Telephone Encounter (Signed)
Patient notified and agreed.  Would like a Dermatology Referral placed.  Referral placed and Rx sent to pharmacy.

## 2021-08-02 ENCOUNTER — Other Ambulatory Visit: Payer: Self-pay

## 2021-08-02 ENCOUNTER — Encounter: Payer: Self-pay | Admitting: Registered Nurse

## 2021-08-02 ENCOUNTER — Encounter: Payer: Medicare Other | Attending: Physical Medicine and Rehabilitation | Admitting: Registered Nurse

## 2021-08-02 VITALS — BP 125/68 | HR 75 | Ht 66.0 in | Wt 221.0 lb

## 2021-08-02 DIAGNOSIS — M7062 Trochanteric bursitis, left hip: Secondary | ICD-10-CM | POA: Insufficient documentation

## 2021-08-02 DIAGNOSIS — Z5181 Encounter for therapeutic drug level monitoring: Secondary | ICD-10-CM | POA: Diagnosis present

## 2021-08-02 DIAGNOSIS — M170 Bilateral primary osteoarthritis of knee: Secondary | ICD-10-CM | POA: Diagnosis not present

## 2021-08-02 DIAGNOSIS — G8929 Other chronic pain: Secondary | ICD-10-CM | POA: Insufficient documentation

## 2021-08-02 DIAGNOSIS — M25512 Pain in left shoulder: Secondary | ICD-10-CM | POA: Insufficient documentation

## 2021-08-02 DIAGNOSIS — G894 Chronic pain syndrome: Secondary | ICD-10-CM | POA: Insufficient documentation

## 2021-08-02 DIAGNOSIS — Z79891 Long term (current) use of opiate analgesic: Secondary | ICD-10-CM | POA: Insufficient documentation

## 2021-08-02 DIAGNOSIS — M7061 Trochanteric bursitis, right hip: Secondary | ICD-10-CM | POA: Diagnosis present

## 2021-08-02 DIAGNOSIS — M25511 Pain in right shoulder: Secondary | ICD-10-CM | POA: Insufficient documentation

## 2021-08-02 DIAGNOSIS — M542 Cervicalgia: Secondary | ICD-10-CM | POA: Diagnosis not present

## 2021-08-02 DIAGNOSIS — M961 Postlaminectomy syndrome, not elsewhere classified: Secondary | ICD-10-CM | POA: Insufficient documentation

## 2021-08-02 MED ORDER — OXYCODONE-ACETAMINOPHEN 10-325 MG PO TABS
1.0000 | ORAL_TABLET | Freq: Four times a day (QID) | ORAL | 0 refills | Status: DC | PRN
Start: 2021-08-02 — End: 2021-09-21

## 2021-08-02 NOTE — Progress Notes (Signed)
Subjective:    Patient ID: Katherine Delgado, female    DOB: September 11, 1940, 80 y.o.   MRN: [id number]  HPI: Katherine Delgado is a 80 y.o. female who returns for follow up appointment for chronic pain and medication refill. She states her pain is located in her neck, right shoulder, lower back pain, bilateral hips and bilateral knee pain. She rates her pain 8. Her current exercise regime is walking with walker in her home.   [name] Morphine equivalent is 60.00 MME.   Last Oral Swab was Performed on 06/29/2021, it was consistent.      Pain Inventory Average Pain 8 Pain Right Now 8 My pain is constant, sharp, burning, dull, stabbing, tingling, and aching  In the last 24 hours, has pain interfered with the following? General activity 10 Relation with others 10 Enjoyment of life 10 What TIME of day is your pain at its worst? morning  and night Sleep (in general) Poor  Pain is worse with: bending, sitting, standing, and some activites Pain improves with: rest and medication Relief from Meds:  a little  Family History  Problem Relation Age of Onset   Alzheimer's disease Mother    Heart disease Mother    Heart disease Father    Liver disease Father    Cancer Brother    Arthritis Son    Colon polyps Brother    Colon cancer Neg Hx    Esophageal cancer Neg Hx    Kidney disease Neg Hx    Stomach cancer Neg Hx    Rectal cancer Neg Hx    Social History   Socioeconomic History   Marital status: Widowed    Spouse name: Not on file   Number of children: 2   Years of education: 14   Highest education level: Not on file  Occupational History   Occupation: Retired  Tobacco Use   Smoking status: Former    Packs/day: 0.10    Years: 10.00    Pack years: 1.00    Types: Cigarettes    Quit date: 12/07/1979    Years since quitting: 41.6   Smokeless tobacco: Never  Vaping Use   Vaping Use: Never used  Substance and Sexual Activity   Alcohol use: No    Alcohol/week: 0.0 standard drinks    Drug use: No   Sexual activity: Not Currently  Other Topics Concern   Not on file  Social History Narrative   Walks with cane   Right handed    Caffeine use: Coffee (2 cups every morning)   Tea: sometimes   Soda: none   Social Determinants of Health   Financial Resource Strain: Not on file  Food Insecurity: Not on file  Transportation Needs: Not on file  Physical Activity: Not on file  Stress: Not on file  Social Connections: Not on file   Past Surgical History:  Procedure Laterality Date   ABDOMINAL HYSTERECTOMY  1974   abdominal tumor  2002   APPENDECTOMY     APPLICATION OF A-CELL OF BACK N/A 09/26/2018   Procedure: With Acell;  Surgeon: Wallace Going, DO;  Location: [facility];  Service: Plastics;  Laterality: N/A;   APPLICATION OF WOUND VAC N/A 09/26/2018   Procedure: Vac placement;  Surgeon: Wallace Going, DO;  Location: [facility];  Service: Plastics;  Laterality: N/A;   BACK SURGERY  1982   BRONCHOSCOPY  2001   CATARACT EXTRACTION, BILATERAL  09/2019, 10/2019   CHOLECYSTECTOMY  1984  INCISION AND DRAINAGE OF WOUND N/A 09/26/2018   Procedure: Debridement of spine wound;  Surgeon: Wallace Going, DO;  Location: [facility];  Service: Plastics;  Laterality: N/A;   KNEE SURGERY Bilateral 08/27/2010 (L) and 01/11/2011 (R)   LUMBAR LAMINECTOMY/DECOMPRESSION MICRODISCECTOMY N/A 07/03/2018   Procedure: Lumbar Three to Lumbar Five Laminectomy;  Surgeon: Judith Part, MD;  Location: [facility];  Service: Neurosurgery;  Laterality: N/A;   LUMBAR WOUND DEBRIDEMENT N/A 08/20/2018   Procedure: POSTERIOR LUMBAR SPINAL WOUND DEBRIDEMENT AND REVISION;  Surgeon: Judith Part, MD;  Location: [facility];  Service: Neurosurgery;  Laterality: N/A;  POSTERIOR LUMBAR SPINAL WOUND REVISION   miscarrage  1962   OVARIAN CYST SURGERY  1968   thymus tumor     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   TONSILLECTOMY     Past Surgical History:  Procedure Laterality Date   ABDOMINAL HYSTERECTOMY  1974    abdominal tumor  2002   APPENDECTOMY     APPLICATION OF A-CELL OF BACK N/A 09/26/2018   Procedure: With Acell;  Surgeon: Wallace Going, DO;  Location: [facility];  Service: Plastics;  Laterality: N/A;   APPLICATION OF WOUND VAC N/A 09/26/2018   Procedure: Vac placement;  Surgeon: Wallace Going, DO;  Location: [facility];  Service: Plastics;  Laterality: N/A;   BACK SURGERY  1982   BRONCHOSCOPY  2001   CATARACT EXTRACTION, BILATERAL  09/2019, 10/2019   CHOLECYSTECTOMY  1984   INCISION AND DRAINAGE OF WOUND N/A 09/26/2018   Procedure: Debridement of spine wound;  Surgeon: Wallace Going, DO;  Location: [facility];  Service: Plastics;  Laterality: N/A;   KNEE SURGERY Bilateral 08/27/2010 (L) and 01/11/2011 (R)   LUMBAR LAMINECTOMY/DECOMPRESSION MICRODISCECTOMY N/A 07/03/2018   Procedure: Lumbar Three to Lumbar Five Laminectomy;  Surgeon: Judith Part, MD;  Location: [facility] [facility];  Service: Neurosurgery;  Laterality: N/A;   LUMBAR WOUND DEBRIDEMENT N/A 08/20/2018   Procedure: POSTERIOR LUMBAR SPINAL WOUND DEBRIDEMENT AND REVISION;  Surgeon: Judith Part, MD;  Location: [facility];  Service: Neurosurgery;  Laterality: N/A;  POSTERIOR LUMBAR SPINAL WOUND REVISION   miscarrage  1962   OVARIAN CYST SURGERY  1968   thymus tumor     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   TONSILLECTOMY     Past Medical History:  Diagnosis Date   Abnormality of gait 04/19/2016   Acute infective polyneuritis ([facility]) 2002   Allergic rhinitis due to pollen    Chronic pain syndrome    COPD (chronic obstructive pulmonary disease) (HCC)    chronic bronchitis   Depressive disorder, not elsewhere classified    Diabetes mellitus without complication ([facility])    Diaphragmatic hernia without mention of obstruction or gangrene    Dyslipidemia    Fibromyalgia    GERD (gastroesophageal reflux disease)    with h/o esophagitis   Guillain-Barre syndrome (HCC)    History of benign thymus tumor    Insomnia, unspecified    Lumbar spinal stenosis  03/30/2018   L4-5 level, severe   Miscarriage 1962   Mixed hyperlipidemia    Morbid obesity ([facility])    Osteoporosis    Pneumonia    Polyneuropathy in diabetes(357.2)    Rheumatoid arthritis(714.0)    Spondylosis, lumbosacral    Spontaneous ecchymoses    Type II or unspecified type diabetes mellitus with peripheral circulatory disorders, uncontrolled(250.72)    Unspecified chronic bronchitis (HCC)    Unspecified essential hypertension    Unspecified hypothyroidism    Unspecified pruritic disorder  Unspecified urinary incontinence    There were no vitals taken for this visit.  Opioid Risk Score:   Fall Risk Score:  `1  Depression screen PHQ 2/9  Depression screen [facility] 2/9 06/29/2021 06/01/2021 04/27/2021 03/11/2021 02/10/2021 01/19/2021 10/15/2020  Decreased Interest 0 1 0 0 0 0 0  Down, Depressed, Hopeless 0 1 0 0 0 0 0  PHQ - 2 Score 0 2 0 0 0 0 0  Altered sleeping - - - - - - -  Tired, decreased energy - - - - - - -  Change in appetite - - - - - - -  Feeling bad or failure about yourself  - - - - - - -  Trouble concentrating - - - - - - -  Moving slowly or fidgety/restless - - - - - - -  Suicidal thoughts - - - - - - -  PHQ-9 Score - - - - - - -  Difficult doing work/chores - - - - - - -  Some recent data might be hidden    Review of Systems  Musculoskeletal:  Positive for arthralgias, back pain and gait problem.       Pain in groin, arms & legs   All other systems reviewed and are negative.     Objective:   Physical Exam Vitals and nursing note reviewed.  Constitutional:      Appearance: Normal appearance.  Cardiovascular:     Rate and Rhythm: Normal rate and regular rhythm.     Pulses: Normal pulses.     Heart sounds: Normal heart sounds.  Pulmonary:     Effort: Pulmonary effort is normal.     Breath sounds: Normal breath sounds.  Musculoskeletal:     Cervical back: Normal range of motion and neck supple.     Right lower leg: Edema present.     Left lower leg:  Edema present.     Comments: Normal Muscle Bulk and Muscle Testing Reveals:  Upper Extremities: Decreased ROM 90 Degrees  and Muscle Strength 4/5 Bilateral AC Joint Tenderness  Lumbar Paraspinal Tenderness: L-3-L-5 Bilateral Greater Trochanter Tenderness Lower Extremities: Decreased ROM and Muscle Strength 5/5 Bilateral Lower Extremities Flexion Produces Pain into her Bilateral Patellas Arrived in wheelchair    Skin:    General: Skin is warm and dry.  Neurological:     Mental Status: She is alert and oriented to person, place, and time.  Psychiatric:        Mood and Affect: Mood normal.        Behavior: Behavior normal.         Assessment & Plan:  Failed Back Syndrome: Continue HEP as Tolerated. Continue to Monitor. She is receiving Home Health therapy at this time, PT/OT two days a week. 08/02/2021 Lumbar Radiculitis: Continue Gabapentin. Continue to monitor. Continue HEP as Tolerated.08/02/2021 Bilateral  Greater Trochanter Bursitis: .Continue to Alternate Ice and Heat Therapy. Continue current medication regimen. Continue to monitor. 08/02/2021 Bilateral Primary OA: Continue HEP as Tolerated. Continue to Monitor. 08/02/2021 Insomnia: Continue Amitriptyline . Continue to Monitor.08/02/2021 Chronic Pain Syndrome: Refilled: Oxycodone 10/325 mg one tablet every 6 hours as needed for pain #120. We will continue the opioid monitoring program, this consists of regular clinic visits, examinations, urine drug screen, pill counts as well as use of [place] Controlled Substance Reporting system. A 12 month History has been reviewed on the [place] Controlled Substance Reporting System on 08/02/2021.   F/U in 1 month

## 2021-08-03 ENCOUNTER — Encounter: Payer: Medicare Other | Admitting: Occupational Therapy

## 2021-08-09 ENCOUNTER — Encounter: Payer: Medicare Other | Admitting: Occupational Therapy

## 2021-08-17 ENCOUNTER — Ambulatory Visit: Payer: Medicare Other | Admitting: Family

## 2021-08-19 ENCOUNTER — Encounter: Payer: Medicare Other | Admitting: Occupational Therapy

## 2021-08-21 ENCOUNTER — Other Ambulatory Visit: Payer: Self-pay | Admitting: Family

## 2021-08-24 ENCOUNTER — Other Ambulatory Visit: Payer: Self-pay | Admitting: *Deleted

## 2021-08-24 ENCOUNTER — Other Ambulatory Visit: Payer: Self-pay | Admitting: Family

## 2021-08-24 ENCOUNTER — Telehealth: Payer: Self-pay

## 2021-08-24 MED ORDER — TRIAMTERENE-HCTZ 37.5-25 MG PO TABS: 1.0000 | ORAL_TABLET | Freq: Every day | ORAL | 0 refills | Status: DC

## 2021-08-24 NOTE — Telephone Encounter (Signed)
PA renewal initiated automatically by CoverMyMeds.  Submitted a Prior Authorization request to [facility] for [facility] via CoverMyMeds.  Key: [id number]  Request canceled due to approval authorization previously approved under Auth# [id number] from 08/22/2021 to 012/31/2023. Nothing further needed at this time.

## 2021-08-24 NOTE — Telephone Encounter (Signed)
Pharmacy requested refill.  Patient needs an appointment before anymore future refills.   Pended Rx and sent to [facility] for approval due to [place].

## 2021-08-29 DIAGNOSIS — M4801 Spinal stenosis, occipito-atlanto-axial region: Secondary | ICD-10-CM | POA: Diagnosis not present

## 2021-08-29 DIAGNOSIS — R531 Weakness: Secondary | ICD-10-CM | POA: Diagnosis not present

## 2021-09-02 ENCOUNTER — Encounter: Payer: Self-pay | Admitting: Internal Medicine

## 2021-09-02 ENCOUNTER — Ambulatory Visit: Payer: Medicare Other | Admitting: Internal Medicine

## 2021-09-02 ENCOUNTER — Other Ambulatory Visit: Payer: Self-pay

## 2021-09-02 VITALS — BP 126/70 | HR 95 | Ht 66.0 in | Wt 216.0 lb

## 2021-09-02 DIAGNOSIS — E039 Hypothyroidism, unspecified: Secondary | ICD-10-CM

## 2021-09-02 DIAGNOSIS — E1142 Type 2 diabetes mellitus with diabetic polyneuropathy: Secondary | ICD-10-CM

## 2021-09-02 DIAGNOSIS — Z794 Long term (current) use of insulin: Secondary | ICD-10-CM

## 2021-09-02 LAB — BASIC METABOLIC PANEL
BUN: 18 mg/dL (ref 6–23)
CO2: 28 mEq/L (ref 19–32)
Calcium: 9.1 mg/dL (ref 8.4–10.5)
Chloride: 103 mEq/L (ref 96–112)
Creatinine, Ser: 1.02 mg/dL (ref 0.40–1.20)
GFR: 51.89 mL/min — ABNORMAL LOW (ref >60.00)
Glucose, Bld: 57 mg/dL — ABNORMAL LOW (ref 70–99)
Potassium: 4.1 mEq/L (ref 3.5–5.1)
Sodium: 140 mEq/L (ref 135–145)

## 2021-09-02 LAB — POCT GLYCOSYLATED HEMOGLOBIN (HGB A1C): Hemoglobin A1C: 5.9 % — AB (ref 4.0–5.6)

## 2021-09-02 LAB — MICROALBUMIN / CREATININE URINE RATIO
Creatinine,U: 104.3 mg/dL
Microalb Creat Ratio: 5.6 mg/g (ref 0.0–30.0)
Microalb, Ur: 5.9 mg/dL — ABNORMAL HIGH (ref 0.0–1.9)

## 2021-09-02 LAB — TSH: TSH: 0.44 u[IU]/mL (ref 0.35–5.50)

## 2021-09-02 NOTE — Patient Instructions (Addendum)
-   Decrease  Toujeo to 90 units daily  - Humalog 8 units with each meal  - Continue Victoza 1.8 mg daily  - Humalog correctional insulin: ADD extra units on insulin to your meal-time humalog dose if your blood sugars are higher than 170. Use the scale below to help guide you:   Blood sugar before meal Number of units to inject  Less than 170 0 unit  171 - 200 1 unit  201 - 230 2 units   231 - 260 3 units   261 - 290 4 units   291 - 320 5 units   321 - 350 6 units          HOW TO TREAT LOW BLOOD SUGARS (Blood sugar LESS THAN 70 MG/DL) Please follow the RULE OF 15 for the treatment of hypoglycemia treatment (when your (blood sugars are less than 70 mg/dL)   STEP 1: Take 15 grams of carbohydrates when your blood sugar is low, which includes:  3-4 GLUCOSE TABS  OR 3-4 OZ OF JUICE OR REGULAR SODA OR ONE TUBE OF GLUCOSE GEL    STEP 2: RECHECK blood sugar in 15 MINUTES STEP 3: If your blood sugar is still low at the 15 minute recheck --> then, go back to STEP 1 and treat AGAIN with another 15 grams of carbohydrates.

## 2021-09-02 NOTE — Progress Notes (Signed)
Name: Katherine Delgado  Age/ Sex: 81 y.o., female   MRN/ DOB: [id number], [birth year]     PCP: Sandrea Hughs, NP   Reason for Endocrinology Evaluation: Type 2 Diabetes Mellitus/ hypothyriodism  Initial Endocrine Consultative Visit: 10/14/2020    PATIENT IDENTIFIER: Katherine Delgado is a 81 y.o. female with a past medical history of HTN, DM, RA, failed back syndrome and Hypothyroidism.. The patient has followed with Endocrinology clinic since 10/14/2020 for consultative assistance with management of her diabetes.      DIABETIC HISTORY:  Katherine Delgado was diagnosed with DM many years ago. Her hemoglobin A1c has ranged from Her A1c was ranged from 6.6% in 2019 to 10.3 % in 2022.     On her initial visit to our clinic her A1c was 10.3% .We continued MDI regimen , victoza and started [place] and was provided with a correction scale . A dexcom prescription was sent   [place] Glade was discontinued due to severe genital irritation    THYROID HISTORY: She has been on LT-4 replacement for years. Was noted to have a consistently low TSH since 05/2020. Per pt has been diagnosed with MNG in the past. No prior sx or biopsies. She was  On Levothyroxine 125 mcg daily which we reduced to 112 mcg daily   SUBJECTIVE:   During the last visit (04/29/2021): A1c 5.9 %, we reduced  MDI regimen and continued Victoza .   Today (09/02/2021): Katherine Delgado is here for a follow up on diabetes and hypothyroidism . She checks her blood sugars multiple times a day through CGM. The patient has had hypoglycemic episodes since the last clinic visit, this is noted during the day following a bolus , she is symptomatic.    Sees rheumatology  for RA   Denies nausea or diarrhea  but feels sluggish    HOME ENDOCRINE REGIMEN:  Toujeo 90 units daily - still takes a 100 units  Humalog 10 units with each meal  Victoza 1.8 mg daily   CF: Humalog ( BG- 130/20) starting at 171      CONTINUOUS GLUCOSE MONITORING RECORD  INTERPRETATION    Dates of Recording: 12/30-1/07/2021  Sensor description:dexcom  Results statistics:   CGM use % of time 86  Average and SD 128/39  Time in range    87   %  % Time Above 180 10  % Time above 250 <1  % Time Below target < 2      Glycemic patterns summary: optimal most of the day and night with hypoglycemia overnight   Hyperglycemic episodes  postprandial   Hypoglycemic episodes occurred during the night and day   Overnight periods: trends down with occasional hypoglycemia      DIABETIC COMPLICATIONS: Microvascular complications:  neuropathy Denies: CKD, retinopathy Last Eye Exam: Completed 03/25/2021  Macrovascular complications:   Denies: CAD, CVA, PVD   HISTORY:  Past Medical History:  Past Medical History:  Diagnosis Date   Abnormality of gait 04/19/2016   Acute infective polyneuritis ([facility]) 2002   Allergic rhinitis due to pollen    Chronic pain syndrome    COPD (chronic obstructive pulmonary disease) (HCC)    chronic bronchitis   Depressive disorder, not elsewhere classified    Diabetes mellitus without complication ([place])    Diaphragmatic hernia without mention of obstruction or gangrene    Dyslipidemia    Fibromyalgia    GERD (gastroesophageal reflux disease)    with h/o esophagitis   Guillain-Barre syndrome ([place])  History of benign thymus tumor    Insomnia, unspecified    Lumbar spinal stenosis 03/30/2018   L4-5 level, severe   Miscarriage 1962   Mixed hyperlipidemia    Morbid obesity ([place])    Osteoporosis    Pneumonia    Polyneuropathy in diabetes(357.2)    Rheumatoid arthritis(714.0)    Spondylosis, lumbosacral    Spontaneous ecchymoses    Type II or unspecified type diabetes mellitus with peripheral circulatory disorders, uncontrolled(250.72)    Unspecified chronic bronchitis (HCC)    Unspecified essential hypertension    Unspecified hypothyroidism    Unspecified pruritic disorder    Unspecified urinary incontinence     Past Surgical History:  Past Surgical History:  Procedure Laterality Date   ABDOMINAL HYSTERECTOMY  1974   abdominal tumor  2002   APPENDECTOMY     APPLICATION OF A-CELL OF BACK N/A 09/26/2018   Procedure: With Acell;  Surgeon: Wallace Going, DO;  Location: [facility];  Service: Plastics;  Laterality: N/A;   APPLICATION OF WOUND VAC N/A 09/26/2018   Procedure: Vac placement;  Surgeon: Wallace Going, DO;  Location: [facility];  Service: Plastics;  Laterality: N/A;   BACK SURGERY  1982   BRONCHOSCOPY  2001   CATARACT EXTRACTION, BILATERAL  09/2019, 10/2019   CHOLECYSTECTOMY  1984   INCISION AND DRAINAGE OF WOUND N/A 09/26/2018   Procedure: Debridement of spine wound;  Surgeon: Wallace Going, DO;  Location: [facility];  Service: Plastics;  Laterality: N/A;   KNEE SURGERY Bilateral 08/27/2010 (L) and 01/11/2011 (R)   LUMBAR LAMINECTOMY/DECOMPRESSION MICRODISCECTOMY N/A 07/03/2018   Procedure: Lumbar Three to Lumbar Five Laminectomy;  Surgeon: Judith Part, MD;  Location: [facility];  Service: Neurosurgery;  Laterality: N/A;   LUMBAR WOUND DEBRIDEMENT N/A 08/20/2018   Procedure: POSTERIOR LUMBAR SPINAL WOUND DEBRIDEMENT AND REVISION;  Surgeon: Judith Part, MD;  Location: [facility];  Service: Neurosurgery;  Laterality: N/A;  POSTERIOR LUMBAR SPINAL WOUND REVISION   miscarrage  1962   OVARIAN CYST SURGERY  1968   thymus tumor     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   TONSILLECTOMY     Social History:  reports that she quit smoking about 41 years ago. Her smoking use included cigarettes. She has a 1.00 pack-year smoking history. She has never used smokeless tobacco. She reports that she does not drink alcohol and does not use drugs. Family History:  Family History  Problem Relation Age of Onset   Alzheimer's disease Mother    Heart disease Mother    Heart disease Father    Liver disease Father    Cancer Brother    Arthritis Son    Colon polyps Brother    Colon cancer Neg Hx    Esophageal  cancer Neg Hx    Kidney disease Neg Hx    Stomach cancer Neg Hx    Rectal cancer Neg Hx      HOME MEDICATIONS: Allergies as of 09/02/2021       Reactions   Influenza Vaccines Other (See Comments)   "h/o Guillain Barre; dr's told me years ago never to take another flu shot as it could relapse Rosalee Kaufman; has to do with when vaccine being changed to H1N1 virus"   Penicillins Hives, Itching, Swelling, Rash   Has patient had a PCN reaction causing immediate rash, facial/tongue/throat swelling, SOB or lightheadedness with hypotension:Unknown Has patient had a PCN reaction causing severe rash involving mucus membranes or skin necrosis: Unknown Has patient had  a PCN reaction that required hospitalization:Unknown Has patient had a PCN reaction occurring within the last 10 years: Unknown If all of the above answers are "NO", then may proceed with Cephalosporin use.   Statins Other (See Comments)   Myopathy, transaminitis   Sulfamethoxazole-trimethoprim Itching        Medication List        Accurate as of September 02, 2021 11:30 AM. If you have any questions, ask your nurse or doctor.          STOP taking these medications    fluconazole 150 MG tablet Commonly known as: DIFLUCAN Stopped by: Dorita Sciara, MD       TAKE these medications    acetaminophen 325 MG tablet Commonly known as: TYLENOL Take 650 mg by mouth every 6 (six) hours as needed (for pain.).   amitriptyline 25 MG tablet Commonly known as: ELAVIL TAKE 1 TABLET(25 MG) BY MOUTH AT BEDTIME   amLODipine 10 MG tablet Commonly known as: NORVASC TAKE 1 TABLET(10 MG) BY MOUTH DAILY   aspirin EC 81 MG tablet Take 81 mg by mouth 2 (two) times a week.   B-D UF III MINI PEN NEEDLES 31G X 5 MM Misc Generic drug: Insulin Pen Needle USE AS DIRECTED   calamine lotion Apply 1 application topically 3 (three) times daily. To affected areas on right fore arm   cholecalciferol 25 MCG (1000 UNIT)  tablet Commonly known as: VITAMIN D3 Take 1,000 Units by mouth daily.   Cranberry 475 MG Caps Take 1 capsule (475 mg total) by mouth 2 (two) times daily.   docusate sodium 100 MG capsule Commonly known as: COLACE Take 100 mg by mouth daily as needed for mild constipation.   DULoxetine 20 MG capsule Commonly known as: Cymbalta Take 1 capsule (20 mg total) by mouth daily.   ezetimibe 10 MG tablet Commonly known as: ZETIA Take 1 tablet (10 mg total) by mouth daily.   folic acid 1 MG tablet Commonly known as: FOLVITE TAKE 2 TABLETS BY MOUTH  DAILY   furosemide 20 MG tablet Commonly known as: LASIX TAKE 1 TABLET BY MOUTH TWICE DAILY AS NEEDED FOR 3 POUND WEIGHT GAIN IN ONE DAY OR 5 POUNDS IN ONE WEEK   gabapentin 600 MG tablet Commonly known as: NEURONTIN TAKE 1 TABLET(600 MG) BY MOUTH THREE TIMES DAILY   HumaLOG KwikPen 200 UNIT/ML KwikPen Generic drug: insulin lispro Max daily 80 units   ICY HOT MEDICATED SPRAY EX Apply 1 application topically as needed (shoulder pain).   ipratropium-albuterol 0.5-2.5 (3) MG/3ML Soln Commonly known as: DUONEB Take 3 mLs by nebulization every 6 (six) hours as needed.   ketoconazole 2 % cream Commonly known as: NIZORAL Apply 1 application topically 2 (two) times daily.   levothyroxine 112 MCG tablet Commonly known as: SYNTHROID Take 1 tablet (112 mcg total) by mouth daily.   lidocaine 5 % Commonly known as: LIDODERM Place 1 patch onto the skin daily. Remove & Discard patch within 12 hours or as directed by MD   lisinopril 20 MG tablet Commonly known as: ZESTRIL TAKE 1 TABLET BY MOUTH EVERY DAY   loperamide 2 MG tablet Commonly known as: IMODIUM A-D Take 2 mg by mouth 4 (four) times daily as needed for diarrhea or loose stools.   Melatonin 10 MG Tabs Take 10 mg by mouth at bedtime as needed (sleep).   metoprolol tartrate 50 MG tablet Commonly known as: LOPRESSOR TAKE 1 TABLET BY MOUTH TWICE DAILY  mirabegron ER 50 MG  Tb24 tablet Commonly known as: Myrbetriq Take 1 tablet (50 mg total) by mouth daily.   MULTIVITAMIN ADULT PO Take 1 tablet by mouth daily.   nystatin powder Commonly known as: nystatin APPLY TOPICALLY THREE TIMES DAILY AS DIRECTED FOR 14 DAYS and prn yeast infection   nystatin cream Commonly known as: MYCOSTATIN Apply 1 application topically 3 (three) times daily. To skin folds   nystatin powder Commonly known as: MYCOSTATIN/NYSTOP Apply 1 application topically 3 (three) times daily.   ofloxacin 0.3 % ophthalmic solution Commonly known as: OCUFLOX SMARTSIG:Left Ear   omega-3 acid ethyl esters 1 g capsule Commonly known as: LOVAZA TAKE ONE CAPSULE BY MOUTH EVERY DAY   oxyCODONE-acetaminophen 10-325 MG tablet Commonly known as: Percocet Take 1 tablet by mouth every 6 (six) hours as needed for pain.   polyvinyl alcohol 1.4 % ophthalmic solution Commonly known as: LIQUIFILM TEARS Place 1 drop into both eyes 4 (four) times daily as needed (for dry/irritated eyes).   potassium chloride 10 MEQ CR capsule Commonly known as: MICRO-K Take 2 capsules (20 mEq total) by mouth 2 (two) times daily as needed (when taking a dose of lasix.).   saccharomyces boulardii 250 MG capsule Commonly known as: FLORASTOR Take 1 capsule (250 mg total) by mouth 2 (two) times daily. For 10 days.   sodium chloride 0.65 % nasal spray Commonly known as: OCEAN Place 2 sprays into the nose as needed for congestion.   tiotropium 18 MCG inhalation capsule Commonly known as: SPIRIVA Place 18 mcg into inhaler and inhale as needed.   tiZANidine 2 MG tablet Commonly known as: ZANAFLEX TAKE 1 TABLET(2 MG) BY MOUTH THREE TIMES DAILY   Toujeo SoloStar 300 UNIT/ML Solostar Pen Generic drug: insulin glargine (1 Unit Dial) Inject 90 Units into the skin daily in the afternoon.   triamterene-hydrochlorothiazide 37.5-25 MG tablet Commonly known as: MAXZIDE-25 TAKE 1 TABLET BY MOUTH DAILY What changed:  Another medication with the same name was removed. Continue taking this medication, and follow the directions you see here. Changed by: Dorita Sciara, MD   venlafaxine XR 75 MG 24 hr capsule Commonly known as: EFFEXOR-XR TAKE 1 CAPSULE BY MOUTH EVERY DAY WITH BREAKFAST   Victoza 18 MG/3ML Sopn Generic drug: liraglutide Administer 1.8mg  under the skin daily for blood sugar.   Xeljanz 5 MG Tabs Generic drug: Tofacitinib Citrate Take 1 tablet (5 mg total) by mouth daily.         OBJECTIVE:   Vital Signs: BP 126/70 (BP Location: Left Arm, Patient Position: Sitting, Cuff Size: Small)    Pulse 95    Ht 5\' 6"  (1.676 m)    Wt 216 lb (98 kg)    SpO2 96%    BMI 34.86 kg/m   Wt Readings from Last 3 Encounters:  09/02/21 216 lb (98 kg)  08/02/21 221 lb (100.2 kg)  06/29/21 214 lb (97.1 kg)     Exam: General: Pt appears well and is in NAD  Lungs: Clear with good BS bilat with no rales, rhonchi, or wheezes  Heart: RRR with normal S1 and S2 and no gallops; no murmurs; no rub  Abdomen: Normoactive bowel sounds, soft, nontender, without masses or organomegaly palpable  Extremities: No pretibial edema. No tremor. Normal strength and motion throughout. See detailed diabetic foot exam below.  Neuro: MS is good with appropriate affect, pt is alert and Ox3    DM foot exam:04/29/2021     The skin of the  feet is without sores or ulcerations, toe nailed thickened The pedal pulses are 1+ on right and 1+ on left. The sensation is decreased  to a screening 5.07, 10 gram monofilament on the right     DATA REVIEWED:  Lab Results  Component Value Date   HGBA1C 5.9 (A) 09/02/2021   HGBA1C 5.9 (A) 04/29/2021   HGBA1C 7.7 (H) 02/12/2021    Latest Reference Range & Units 09/02/21 10:39  Sodium 135 - 145 mEq/L 140  Potassium 3.5 - 5.1 mEq/L 4.1  Chloride 96 - 112 mEq/L 103  CO2 19 - 32 mEq/L 28  Glucose 70 - 99 mg/dL 57 (L)  BUN 6 - 23 mg/dL 18  Creatinine 0.40 - 1.20 mg/dL 1.02   Calcium 8.4 - 10.5 mg/dL 9.1  GFR >60.00 mL/min 51.89 (L)    Latest Reference Range & Units 09/02/21 10:39  MICROALB/CREAT RATIO 0.0 - 30.0 mg/g 5.6    Latest Reference Range & Units 09/02/21 10:39  TSH 0.35 - 5.50 uIU/mL 0.44     ASSESSMENT / PLAN / RECOMMENDATIONS:   1) Type 2 Diabetes Mellitus, With  Neuropathic complications - Most recent A1c of 5.9 %. Goal A1c < 7.5 %.    -Her A1c remains low at 5.9% and she continues with hyperglycemia, unfortunately the patient never reduced her insulin as previously advised.  Today I made sure that she understood the instruction of reducing her basal insulin from 100 units to 9 units, patient was able to repeat back the instructions. -I am not quite sure how she is using her Humalog as initially she told me she is not taking it she then said she uses the scale and eventually she told me that she takes 8 units with a meal.  We discussed the importance of following instructions on insulin to prevent hypoglycemia patient to use prandial insulin with each meal and correction scale if needed - Intolerant to SGLT-2 inhibitors due to severe genital irritations  - I have reviewed her CGM and will reduce insulin as below  MEDICATIONS: - Decrease Toujeo to 90  units daily  -Change Humalog 8 units with each meal  - Continue Victoza 1.8 mg daily  - CF : Humalog ( BG - 140/30 )    EDUCATION / INSTRUCTIONS: BG monitoring instructions: Patient is instructed to check her blood sugars 3 times a day, before meals . Call [facility] if: BG persistently < 70  I reviewed the Rule of 15 for the treatment of hypoglycemia in detail with the patient. Literature supplied.   2) Diabetic complications:  Eye: Does not have known diabetic retinopathy.  Neuro/ Feet: Does  have known diabetic peripheral neuropathy .  Renal: Patient does not have known baseline CKD.    3) Hypothyroidism:   -She has been feeling sluggish -TSH today is normal, no  change  Medication Continue Levothyroxine 112 mcg daily     F/U in 6 months     Signed electronically by: Mack Guise, MD  [facility]  [facility] [address] [place], [place] [address] Phone: [phone] FAX: [phone]   CC: Sandrea Hughs, NP [place] [address] Phone: [phone]  Fax: [phone]  Return to Endocrinology clinic as below: Future Appointments  Date Time Provider [place]  09/21/2021 10:00 AM Raulkar, Clide Deutscher, MD [facility] CPR  09/30/2021  2:30 PM Bo Merino, MD [facility] None  01/06/2022  8:50 AM Pieter Partridge, DO [facility]  None  02/17/2022 11:00 AM Ngetich, Nelda Bucks, NP [facility] None  03/02/2022 10:30 AM Harvey Lingo, Melanie Crazier, MD [facility] None

## 2021-09-03 ENCOUNTER — Encounter: Payer: Self-pay | Admitting: Internal Medicine

## 2021-09-03 MED ORDER — LEVOTHYROXINE SODIUM 112 MCG PO TABS: 112.0000 ug | ORAL_TABLET | Freq: Every day | ORAL | 3 refills | Status: DC

## 2021-09-17 NOTE — Progress Notes (Signed)
Office Visit Note  Patient: Katherine Delgado             Date of Birth: 1941/04/13           MRN: [id number]             PCP: Sandrea Hughs, NP Referring: Sandrea Hughs, NP Visit Date: 09/30/2021 Occupation: @GUAROCC @  Subjective:  Pain and swelling in multiple joints  History of Present Illness: Katherine Delgado is a 81 y.o. female with history of rheumatoid arthritis, osteoarthritis, fibromyalgia.  She states she continues to have pain and swelling in multiple joints.  She has a lot of discomfort.  She states she never got the prescription for Xeljanz.  She states that shingles rash resolved after the treatment.  She does not have any rash now.  She complains of pain and discomfort in her bilateral shoulders, elbows, wrists, hands, knee joints, ankles and her feet.  She has noticed swelling in her wrist and her hands.  She continues to have dry mouth and dry eyes.  Activities of Daily Living:  Patient reports morning stiffness for all day. Patient Reports nocturnal pain.  Difficulty dressing/grooming: Reports Difficulty climbing stairs: Reports Difficulty getting out of chair: Reports Difficulty using hands for taps, buttons, cutlery, and/or writing: Reports  Review of Systems  Constitutional:  Positive for fatigue.  HENT:  Positive for mouth dryness and nose dryness. Negative for mouth sores.   Eyes:  Positive for dryness. Negative for pain and itching.  Respiratory:  Positive for shortness of breath. Negative for difficulty breathing.   Cardiovascular:  Positive for chest pain and palpitations.  Gastrointestinal:  Negative for blood in stool, constipation and diarrhea.  Endocrine: Negative for increased urination.  Genitourinary:  Positive for involuntary urination. Negative for difficulty urinating.  Musculoskeletal:  Positive for joint pain, joint pain, joint swelling, myalgias, morning stiffness, muscle tenderness and myalgias.  Skin:  Negative for color change, rash and  sensitivity to sunlight.  Allergic/Immunologic: Positive for susceptible to infections.  Neurological:  Positive for numbness and weakness. Negative for dizziness, headaches and memory loss.  Hematological:  Negative for bruising/bleeding tendency.  Psychiatric/Behavioral:  Negative for confusion.    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   Primary osteoarthritis of right knee 07/25/2019   Wound dehiscence 08/17/2018   Lumbar spinal stenosis 03/30/2018   Rheumatoid arthritis involving both wrists with positive rheumatoid factor ([place]) 06/14/2016   High risk medication use 06/14/2016   Sjogren's syndrome ([place]) 06/14/2016   Primary osteoarthritis of both knees 06/14/2016   DDD (degenerative disc disease), lumbar 06/14/2016   Hypothyroidism 06/14/2016   Osteoporosis 06/14/2016   Abnormality of gait 04/19/2016   Type 2 diabetes mellitus with diabetic polyneuropathy, with long-term current use of insulin ([place]) 11/27/2015   Diabetes mellitus without complication ([place]) 96/22/2979   Headache(784.0) 07/31/2013   Sinus infection 07/31/2013   Chronic right-sided low back pain with right-sided sciatica 03/14/2013   Insomnia 12/06/2012   Nausea alone 12/06/2012   Diarrhea 12/06/2012   Urinary incontinence, urge 12/06/2012   Lumbosacral root lesions, not elsewhere classified 11/27/2012   Diabetic polyneuropathy associated with type 2 diabetes mellitus ([place]) 11/27/2012   EDEMA 05/04/2010   YEAST INFECTION 05/03/2010   Hyperlipidemia 05/03/2010   DEPRESSION 05/03/2010   History of peripheral neuropathy 05/03/2010   Essential hypertension 05/03/2010   ALLERGIC RHINITIS 05/03/2010   PNEUMONIA 05/03/2010   COPD (chronic  obstructive pulmonary disease) ([place]) 05/03/2010   GERD 05/03/2010   Fibromyalgia 05/03/2010   DYSPNEA 05/03/2010   CHEST PAIN 05/03/2010    Past Medical History:  Diagnosis Date   Abnormality of  gait 04/19/2016   Acute infective polyneuritis ([place]) 2002   Allergic rhinitis due to pollen    Chronic pain syndrome    COPD (chronic obstructive pulmonary disease) (HCC)    chronic bronchitis   Depressive disorder, not elsewhere classified    Diabetes mellitus without complication ([place])    Diaphragmatic hernia without mention of obstruction or gangrene    Dyslipidemia    Fibromyalgia    GERD (gastroesophageal reflux disease)    with h/o esophagitis   Guillain-Barre syndrome (HCC)    History of benign thymus tumor    Insomnia, unspecified    Lumbar spinal stenosis 03/30/2018   L4-5 level, severe   Miscarriage 1962   Mixed hyperlipidemia    Morbid obesity ([place])    Osteoporosis    Pneumonia    Polyneuropathy in diabetes(357.2)    Rheumatoid arthritis(714.0)    Spondylosis, lumbosacral    Spontaneous ecchymoses    Type II or unspecified type diabetes mellitus with peripheral circulatory disorders, uncontrolled(250.72)    Unspecified chronic bronchitis (HCC)    Unspecified essential hypertension    Unspecified hypothyroidism    Unspecified pruritic disorder    Unspecified urinary incontinence     Family History  Problem Relation Age of Onset   Alzheimer's disease Mother    Heart disease Mother    Heart disease Father    Liver disease Father    Cancer Brother    Arthritis Son    Colon polyps Brother    Colon cancer Neg Hx    Esophageal cancer Neg Hx    Kidney disease Neg Hx    Stomach cancer Neg Hx    Rectal cancer Neg Hx    Past Surgical History:  Procedure Laterality Date   ABDOMINAL HYSTERECTOMY  1974   abdominal tumor  2002   APPENDECTOMY     APPLICATION OF A-CELL OF BACK N/A 09/26/2018   Procedure: With Acell;  Surgeon: Wallace Going, DO;  Location: [facility];  Service: Plastics;  Laterality: N/A;   APPLICATION OF WOUND VAC N/A 09/26/2018   Procedure: Vac placement;  Surgeon: Wallace Going, DO;  Location: [facility];  Service: Plastics;  Laterality: N/A;   BACK  SURGERY  1982   BRONCHOSCOPY  2001   CATARACT EXTRACTION, BILATERAL  09/2019, 10/2019   CHOLECYSTECTOMY  1984   INCISION AND DRAINAGE OF WOUND N/A 09/26/2018   Procedure: Debridement of spine wound;  Surgeon: Wallace Going, DO;  Location: [facility];  Service: Plastics;  Laterality: N/A;   KNEE SURGERY Bilateral 08/27/2010 (L) and 01/11/2011 (R)   LUMBAR LAMINECTOMY/DECOMPRESSION MICRODISCECTOMY N/A 07/03/2018   Procedure: Lumbar Three to Lumbar Five Laminectomy;  Surgeon: Judith Part, MD;  Location: [facility];  Service: Neurosurgery;  Laterality: N/A;   LUMBAR WOUND DEBRIDEMENT N/A 08/20/2018   Procedure: POSTERIOR LUMBAR SPINAL WOUND DEBRIDEMENT AND REVISION;  Surgeon: Judith Part, MD;  Location: [facility];  Service: Neurosurgery;  Laterality: N/A;  POSTERIOR LUMBAR SPINAL WOUND REVISION   miscarrage  1962   OVARIAN CYST SURGERY  1968   thymus tumor     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   TONSILLECTOMY     Social History   Social History Narrative   Walks with cane   Right handed    Caffeine use: Coffee (  2 cups every morning)   Tea: sometimes   Soda: none   Immunization History  Administered Date(s) Administered   Influenza-Unspecified 06/10/2011   PFIZER(Purple Top)SARS-COV-2 Vaccination 11/13/2019, 11/30/2019   Pneumococcal Conjugate-13 07/07/2016   Pneumococcal Polysaccharide-23 11/21/2003, 10/05/2017   Tdap 05/28/2016     Objective: Vital Signs: BP (!) 150/61 (BP Location: Right Arm, Patient Position: Sitting, Cuff Size: Large)    Pulse 76    Ht 5\' 6"  (1.676 m)    Wt 210 lb (95.3 kg) Comment: per patient   BMI 33.89 kg/m    Physical Exam Vitals and nursing note reviewed.  Constitutional:      Appearance: She is well-developed.  HENT:     Head: Normocephalic and atraumatic.  Eyes:     Conjunctiva/sclera: Conjunctivae normal.  Cardiovascular:     Rate and Rhythm: Normal rate and regular rhythm.     Heart sounds: Normal heart sounds.  Pulmonary:     Effort: Pulmonary  effort is normal.     Breath sounds: Normal breath sounds.  Abdominal:     General: Bowel sounds are normal.     Palpations: Abdomen is soft.  Musculoskeletal:     Cervical back: Normal range of motion.     Right lower leg: Edema present.     Left lower leg: Edema present.  Lymphadenopathy:     Cervical: No cervical adenopathy.  Skin:    General: Skin is warm and dry.     Capillary Refill: Capillary refill takes less than 2 seconds.  Neurological:     Mental Status: She is alert and oriented to person, place, and time.  Psychiatric:        Behavior: Behavior normal.     Musculoskeletal Exam: Patient is wheelchair-bound.  She had limited range of motion of her cervical spine.  Shoulder joints with good range of motion.  She had no synovitis on palpation of her elbow joints but had tenderness.  Synovitis was noted over bilateral wrist joints and some of her MCPs and PIPs as described below.  Hip joints were difficult to assess in the wheelchair.  She had good range of motion of her knee joints without any warmth swelling or effusion.  She had discomfort range of motion of her knee joints.  There was no tenderness over ankles or MTPs.  She has significant bilateral lower extremity edema.  CDAI Exam: CDAI Score: 17.4  Patient Global: 8 mm; Provider Global: 6 mm Swollen: 5 ; Tender: 13  Joint Exam 09/30/2021      Right  Left  Glenohumeral   Tender   Tender  Elbow   Tender   Tender  Wrist  Swollen Tender  Swollen Tender  MCP 2  Swollen Tender     MCP 3  Swollen Tender     PIP 4  Swollen Tender     Knee   Tender   Tender  Ankle   Tender   Tender     Investigation: No additional findings.  Imaging: No results found.  Recent Labs: Lab Results  Component Value Date   WBC 10.5 03/31/2021   HGB 14.2 03/31/2021   PLT 258 03/31/2021   NA 140 09/02/2021   K 4.1 09/02/2021   CL 103 09/02/2021   CO2 28 09/02/2021   GLUCOSE 57 (L) 09/02/2021   BUN 18 09/02/2021   CREATININE  1.02 09/02/2021   BILITOT 0.4 02/12/2021   ALKPHOS 96 01/25/2021   AST 16 02/12/2021   ALT 12 02/12/2021  PROT 5.9 (L) 02/12/2021   ALBUMIN 3.5 01/25/2021   CALCIUM 9.1 09/02/2021   GFRAA 65 02/12/2021   QFTBGOLDPLUS NEGATIVE 06/11/2020    Speciality Comments: No specialty comments available.  Procedures:  No procedures performed Allergies: Influenza vaccines, Penicillins, Statins, and Sulfamethoxazole-trimethoprim   Assessment / Plan:     Visit Diagnoses: Rheumatoid arthritis involving multiple sites with positive rheumatoid factor (HCC)-she has been experiencing increased pain and discomfort in her joints.  She states she did not get the prescription for Morrie Sheldon after her last visit.  Prescription was mailed in September 2022.  She had synovitis in multiple joints today.  Indications side effects of Morrie Sheldon were reviewed.  Increased risk of MACE events were discussed.  Will obtain labs today and send a prescription for Xeljanz.  High risk medication use - Xeljanz 5 mg 1 tablet by mouth daily. D/c Arava-SE of diarrhea, d/c MTX due to recurent infections. - Plan: CBC with Differential/Platelet, COMPLETE METABOLIC PANEL WITH GFR, QuantiFERON-TB Gold Plus today.  She has been advised to get labs every 3 months to monitor for drug toxicity.  She is also advised to hold off Morrie Sheldon in case she develops an infection and resume after the infection resolves.  She was also advised to stop Morrie Sheldon if she develops any abdominal pain.  Sjogren's syndrome with keratoconjunctivitis sicca (HCC)-she continues to have sicca symptoms.  Over-the-counter products were discussed.  Primary osteoarthritis of both knees-she complains of discomfort in her knee joints.  She is wheelchair-bound.  DDD (degenerative disc disease), lumbar-she has chronic lower back pain.  Age-related osteoporosis without current pathological fracture - DEXA ordered by PCP  Other insomnia-good sleep hygiene was  discussed.  Fibromyalgia-she continues to have generalized pain and discomfort from fibromyalgia.  She generalized hyperalgesia.  Herpes zoster without complication-she had an infection in August 2022 which was treated with Valtrex.  Further medical problems listed as follows:  History of diabetes mellitus  History of Guillain-Barre syndrome  History of COPD  History of gastroesophageal reflux (GERD)  History of hypertension  History of depression  History of peripheral neuropathy  Orders: Orders Placed This Encounter  Procedures   CBC with Differential/Platelet   COMPLETE METABOLIC PANEL WITH GFR   QuantiFERON-TB Gold Plus   Meds ordered this encounter  Medications   Tofacitinib Citrate (XELJANZ) 5 MG TABS    Sig: Take 1 tablet (5 mg total) by mouth daily.    Dispense:  90 tablet    Refill:  0     Follow-Up Instructions: Return in about 3 months (around 12/28/2021) for Rheumatoid arthritis.   Bo Merino, MD  Note - This record has been created using Editor, commissioning.  Chart creation errors have been sought, but may not always  have been located. Such creation errors do not reflect on  the standard of medical care.

## 2021-09-20 ENCOUNTER — Other Ambulatory Visit: Payer: Self-pay | Admitting: Family

## 2021-09-20 NOTE — Telephone Encounter (Signed)
Patient has request refill on medication "Venlafaxine 75mg ". Patient medication last refilled 03/29/2021. Patient medication has High Risk Warning. Medication pend and sent to PCP [place], Nelda Bucks, NP .

## 2021-09-21 ENCOUNTER — Encounter: Payer: Self-pay | Admitting: Physical Medicine and Rehabilitation

## 2021-09-21 ENCOUNTER — Encounter
Payer: Medicare Other | Attending: Physical Medicine and Rehabilitation | Admitting: Physical Medicine and Rehabilitation

## 2021-09-21 ENCOUNTER — Other Ambulatory Visit: Payer: Self-pay | Admitting: Physical Medicine and Rehabilitation

## 2021-09-21 ENCOUNTER — Other Ambulatory Visit: Payer: Self-pay

## 2021-09-21 VITALS — BP 97/58 | HR 65 | Temp 98.1°F | Ht 66.0 in | Wt 213.0 lb

## 2021-09-21 DIAGNOSIS — M961 Postlaminectomy syndrome, not elsewhere classified: Secondary | ICD-10-CM | POA: Insufficient documentation

## 2021-09-21 DIAGNOSIS — M542 Cervicalgia: Secondary | ICD-10-CM | POA: Insufficient documentation

## 2021-09-21 MED ORDER — LIDOCAINE 5 % EX PTCH: 1.0000 | MEDICATED_PATCH | CUTANEOUS | 0 refills | Status: DC

## 2021-09-21 MED ORDER — OXYCODONE-ACETAMINOPHEN 10-325 MG PO TABS: 1.0000 | ORAL_TABLET | Freq: Four times a day (QID) | ORAL | 0 refills | Status: DC | PRN

## 2021-09-21 NOTE — Progress Notes (Signed)
Subjective:    Patient ID: Katherine Delgado, female    DOB: [birth year], 81 y.o.   MRN: [id number]  HPI: Katherine Delgado is a 81 y.o. female who returns for follow-up for chronic pain and medication refill. She states her pain is located in her bilateral shoulders, lower back pain radiating into his left hip. Her current exercise regime is receiving PT/OT two days a week.  1) RUE post-herpetic neuralgia Ms. Brienza Morphine equivalent is 60.00  MME. Her pain medications are not helping her right hand post-shingles pain. The pain has gotten better but it still can be sever and feels like someone beat her with a hammer. Pain radiates up into the elbow and shoulder. The pain can be excruciating. She asks if we can add something else.  -she is ready to try Qutenza today -pain is currently worst in her elbow -radiates all the way into her hand  She was hospitalized with pneumonia since I saw her last. She was also told she had shingles, and she is toward the end of it now. Her fingers still have decreased strength on her right side- she does note improvements. She cannot get her right thumb to elevated. She will be seeing Dr. Estanislado Pandy. She has been unable to write.   She has been using cranberry for her incontinence.   2) Failed back syndrome -she needs refill of her percocet today -it continues to provide her relief -provided with pain journal last visit -her son accompanies her today -she did obtain relief from the lidocaine patch before    Pain Inventory Average Pain 8 Pain Right Now 9 My pain is sharp, burning, stabbing, and aching  In the last 24 hours, has pain interfered with the following? General activity 10 Relation with others 10 Enjoyment of life 10 What TIME of day is your pain at its worst? night Sleep (in general) Poor  Pain is worse with: walking, bending, sitting, standing, and some activites Pain improves with: rest, therapy/exercise, and medication Relief from Meds:  3  Family History  Problem Relation Age of Onset   Alzheimer's disease Mother    Heart disease Mother    Heart disease Father    Liver disease Father    Cancer Brother    Arthritis Son    Colon polyps Brother    Colon cancer Neg Hx    Esophageal cancer Neg Hx    Kidney disease Neg Hx    Stomach cancer Neg Hx    Rectal cancer Neg Hx    Social History   Socioeconomic History   Marital status: Widowed    Spouse name: Not on file   Number of children: 2   Years of education: 14   Highest education level: Not on file  Occupational History   Occupation: Retired  Tobacco Use   Smoking status: Former    Packs/day: 0.10    Years: 10.00    Pack years: 1.00    Types: Cigarettes    Quit date: 12/07/1979    Years since quitting: 41.8   Smokeless tobacco: Never  Vaping Use   Vaping Use: Never used  Substance and Sexual Activity   Alcohol use: No    Alcohol/week: 0.0 standard drinks   Drug use: No   Sexual activity: Not Currently  Other Topics Concern   Not on file  Social History Narrative   Walks with cane   Right handed    Caffeine use: Coffee (2 cups every morning)  Tea: sometimes   Soda: none   Social Determinants of Radio broadcast assistant Strain: Not on file  Food Insecurity: Not on file  Transportation Needs: Not on file  Physical Activity: Not on file  Stress: Not on file  Social Connections: Not on file   Past Surgical History:  Procedure Laterality Date   ABDOMINAL HYSTERECTOMY  1974   abdominal tumor  2002   APPENDECTOMY     APPLICATION OF A-CELL OF BACK N/A 09/26/2018   Procedure: With Acell;  Surgeon: Wallace Going, DO;  Location: [facility];  Service: Plastics;  Laterality: N/A;   APPLICATION OF WOUND VAC N/A 09/26/2018   Procedure: Vac placement;  Surgeon: Wallace Going, DO;  Location: [facility];  Service: Plastics;  Laterality: N/A;   BACK SURGERY  1982   BRONCHOSCOPY  2001   CATARACT EXTRACTION, BILATERAL  09/2019, 10/2019    CHOLECYSTECTOMY  1984   INCISION AND DRAINAGE OF WOUND N/A 09/26/2018   Procedure: Debridement of spine wound;  Surgeon: Wallace Going, DO;  Location: [facility];  Service: Plastics;  Laterality: N/A;   KNEE SURGERY Bilateral 08/27/2010 (L) and 01/11/2011 (R)   LUMBAR LAMINECTOMY/DECOMPRESSION MICRODISCECTOMY N/A 07/03/2018   Procedure: Lumbar Three to Lumbar Five Laminectomy;  Surgeon: Judith Part, MD;  Location: [facility];  Service: Neurosurgery;  Laterality: N/A;   LUMBAR WOUND DEBRIDEMENT N/A 08/20/2018   Procedure: POSTERIOR LUMBAR SPINAL WOUND DEBRIDEMENT AND REVISION;  Surgeon: Judith Part, MD;  Location: [facility];  Service: Neurosurgery;  Laterality: N/A;  POSTERIOR LUMBAR SPINAL WOUND REVISION   miscarrage  1962   OVARIAN CYST SURGERY  1968   thymus tumor     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   TONSILLECTOMY     Past Surgical History:  Procedure Laterality Date   ABDOMINAL HYSTERECTOMY  1974   abdominal tumor  2002   APPENDECTOMY     APPLICATION OF A-CELL OF BACK N/A 09/26/2018   Procedure: With Acell;  Surgeon: Wallace Going, DO;  Location: [facility];  Service: Plastics;  Laterality: N/A;   APPLICATION OF WOUND VAC N/A 09/26/2018   Procedure: Vac placement;  Surgeon: Wallace Going, DO;  Location: [facility];  Service: Plastics;  Laterality: N/A;   BACK SURGERY  1982   BRONCHOSCOPY  2001   CATARACT EXTRACTION, BILATERAL  09/2019, 10/2019   CHOLECYSTECTOMY  1984   INCISION AND DRAINAGE OF WOUND N/A 09/26/2018   Procedure: Debridement of spine wound;  Surgeon: Wallace Going, DO;  Location: [facility];  Service: Plastics;  Laterality: N/A;   KNEE SURGERY Bilateral 08/27/2010 (L) and 01/11/2011 (R)   LUMBAR LAMINECTOMY/DECOMPRESSION MICRODISCECTOMY N/A 07/03/2018   Procedure: Lumbar Three to Lumbar Five Laminectomy;  Surgeon: Judith Part, MD;  Location: [facility];  Service: Neurosurgery;  Laterality: N/A;   LUMBAR WOUND DEBRIDEMENT N/A 08/20/2018   Procedure: POSTERIOR LUMBAR  SPINAL WOUND DEBRIDEMENT AND REVISION;  Surgeon: Judith Part, MD;  Location: [facility];  Service: Neurosurgery;  Laterality: N/A;  POSTERIOR LUMBAR SPINAL WOUND REVISION   miscarrage  1962   OVARIAN CYST SURGERY  1968   thymus tumor     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   TONSILLECTOMY     Past Medical History:  Diagnosis Date   Abnormality of gait 04/19/2016   Acute infective polyneuritis ([facility]) 2002   Allergic rhinitis due to pollen    Chronic pain syndrome    COPD (chronic obstructive pulmonary disease) (HCC)    chronic  bronchitis   Depressive disorder, not elsewhere classified    Diabetes mellitus without complication ([place])    Diaphragmatic hernia without mention of obstruction or gangrene    Dyslipidemia    Fibromyalgia    GERD (gastroesophageal reflux disease)    with h/o esophagitis   Guillain-Barre syndrome (HCC)    History of benign thymus tumor    Insomnia, unspecified    Lumbar spinal stenosis 03/30/2018   L4-5 level, severe   Miscarriage 1962   Mixed hyperlipidemia    Morbid obesity ([facility])    Osteoporosis    Pneumonia    Polyneuropathy in diabetes(357.2)    Rheumatoid arthritis(714.0)    Spondylosis, lumbosacral    Spontaneous ecchymoses    Type II or unspecified type diabetes mellitus with peripheral circulatory disorders, uncontrolled(250.72)    Unspecified chronic bronchitis (HCC)    Unspecified essential hypertension    Unspecified hypothyroidism    Unspecified pruritic disorder    Unspecified urinary incontinence    Temp 98.1 F (36.7 C) (Oral)    Ht 5\' 6"  (1.676 m)    Wt 213 lb (96.6 kg)    BMI 34.38 kg/m   Opioid Risk Score:   Fall Risk Score:  `1  Depression screen PHQ 2/9  Depression screen [facility] 2/9 08/02/2021 06/29/2021 06/01/2021 04/27/2021 03/11/2021 02/10/2021 01/19/2021  Decreased Interest 1 0 1 0 0 0 0  Down, Depressed, Hopeless 1 0 1 0 0 0 0  PHQ - 2 Score 2 0 2 0 0 0 0  Altered sleeping - - - - - - -  Tired, decreased energy - - - - - - -  Change in  appetite - - - - - - -  Feeling bad or failure about yourself  - - - - - - -  Trouble concentrating - - - - - - -  Moving slowly or fidgety/restless - - - - - - -  Suicidal thoughts - - - - - - -  PHQ-9 Score - - - - - - -  Difficult doing work/chores - - - - - - -  Some recent data might be hidden       Review of Systems  Constitutional: Negative.   HENT: Negative.    Eyes: Negative.   Respiratory: Negative.    Cardiovascular: Negative.   Gastrointestinal: Negative.   Endocrine: Negative.   Genitourinary: Negative.   Musculoskeletal:  Positive for back pain, gait problem and neck pain.       Pain in noth legs, pain  in right shoulder , pain in both knees , pain in both feet  Skin: Negative.   Allergic/Immunologic: Negative.   Hematological: Negative.   Psychiatric/Behavioral: Negative.        Objective:   Physical Exam Gen: no distress, normal appearing HEENT: oral mucosa pink and moist, hard of hearing Cardio: Reg rate Chest: normal effort, normal rate of breathing Abd: soft, non-distended Ext: no edema Psych: pleasant, normal affect Skin: intact Musculoskeletal:     Cervical back: Normal range of motion and neck supple.     Comments: Normal Muscle Bulk and Muscle Testing Reveals:  Upper Extremities: Full ROM and Muscle Strength 5/5 Bilateral AC Joint Tenderness Lumbar Paraspinal Tenderness: L-3-L-5 Left Greater Trochanter Tenderness Lower Extremities: Full ROM and Muscle Strength 5/5 Arrived in wheelchair     Skin:    General: Skin is warm and dry. Small openings along arm- appear to be scratch marks Neurological:     Mental Status: She is  alert and oriented to person, place, and time.  Psychiatric:        Mood and Affect: Mood normal.        Behavior: Behavior normal.        Assessment & Plan:  Failed Back Syndrome: Coninue HEP as Tolerated. Continue to Monitor. She is receiving Home Health therapy at this time, PT/OT two days a week. 03/11/2021.  Prescribed lidocaine patch.  Lumbar Radiculitis: Continue Gabapentin. Continue to monitor. Continue HEP as Tolerated.03/11/2021 Left  Greater Trochanter Bursitis: Scheduled for left hip injection with Dr Ranell Patrick.Continue to Alternate Ice and Heat Therapy. Continue current medication regimen. Continue to monitor. 03/11/2021 Bilateral Primary OA: Continue HEP as Tolerated. Continue to Monitor. 03/11/2021 Insomnia: Continue Amitriptyline . Continue to Monitor.03/11/2021. Discussed trying tart cherry juice Chronic Pain Syndrome: Refilled Oxycodone 10/325 mg one tablet every 6 hours as needed for pain #120. We will continue the opioid monitoring program, this consists of regular clinic visits, examinations, urine drug screen, pill counts as well as use of [place] Controlled Substance Reporting system. Discussed the differences between oxycodone and oxycontin. Provided with a pain relief journal  -Discussed current symptoms of pain and history of pain.  -Discussed benefits of exercise in reducing pain. -Discussed following foods that may reduce pain: 1) Ginger (especially studied for arthritis)- reduce leukotriene production to decrease inflammation 2) Blueberries- high in phytonutrients that decrease inflammation 3) Salmon- marine omega-3s reduce joint swelling and pain 4) Pumpkin seeds- reduce inflammation 5) dark chocolate- reduces inflammation 6) turmeric- reduces inflammation 7) tart cherries - reduce pain and stiffness 8) extra virgin olive oil - its compound olecanthal helps to block prostaglandins  9) chili peppers- can be eaten or applied topically via capsaicin 10) mint- helpful for headache, muscle aches, joint pain, and itching 11) garlic- reduces inflammation  Link to further information on diet for chronic pain: http://www.randall.com/   Right hand decreased strength post-shingles: provided referral for  occupational therapy  8. Right sided excruciating post-shingles pain -Discussed Qutenza as an option for neuropathic pain control. Discussed that this is a capsaicin patch, stronger than capsaicin cream. Discussed that it is currently approved for diabetic peripheral neuropathy and post-herpetic neuralgia, but that it has also shown benefit in treating other forms of neuropathy. Provided patient with link to site to learn more about the patch: [employer].fr. Discussed that the patch would be placed in office and benefits usually last 3 months. Discussed that unintended exposure to capsaicin can cause severe irritation of eyes, mucous membranes, respiratory tract, and skin, but that Qutenza is a local treatment and does not have the systemic side effects of other nerve medications. Discussed that there may be pain, itching, erythema, and decreased sensory function associated with the application of Qutenza. Side effects usually subside within 1 week. A cold pack of analgesic medications can help with these side effects. Blood pressure can also be increased due to pain associated with administration of the patch.  2 patches of Qutenza was applied to the area of pain. Ice packs were applied during the procedure to ensure patient comfort. Blood pressure was monitored every 15 minutes. The patient tolerated the procedure well. Post-procedure instructions were given and follow-up has been scheduled.

## 2021-09-29 ENCOUNTER — Other Ambulatory Visit: Payer: Self-pay | Admitting: Physical Medicine and Rehabilitation

## 2021-09-29 DIAGNOSIS — R531 Weakness: Secondary | ICD-10-CM | POA: Diagnosis not present

## 2021-09-29 DIAGNOSIS — M4801 Spinal stenosis, occipito-atlanto-axial region: Secondary | ICD-10-CM | POA: Diagnosis not present

## 2021-09-30 ENCOUNTER — Other Ambulatory Visit: Payer: Self-pay

## 2021-09-30 ENCOUNTER — Encounter: Payer: Self-pay | Admitting: Rheumatology

## 2021-09-30 ENCOUNTER — Ambulatory Visit (INDEPENDENT_AMBULATORY_CARE_PROVIDER_SITE_OTHER): Payer: Medicare Other | Admitting: Rheumatology

## 2021-09-30 VITALS — BP 150/61 | HR 76 | Ht 66.0 in | Wt 210.0 lb

## 2021-09-30 DIAGNOSIS — G4709 Other insomnia: Secondary | ICD-10-CM | POA: Diagnosis not present

## 2021-09-30 DIAGNOSIS — M810 Age-related osteoporosis without current pathological fracture: Secondary | ICD-10-CM

## 2021-09-30 DIAGNOSIS — Z8669 Personal history of other diseases of the nervous system and sense organs: Secondary | ICD-10-CM | POA: Diagnosis not present

## 2021-09-30 DIAGNOSIS — Z8639 Personal history of other endocrine, nutritional and metabolic disease: Secondary | ICD-10-CM | POA: Diagnosis not present

## 2021-09-30 DIAGNOSIS — M51369 Other intervertebral disc degeneration, lumbar region without mention of lumbar back pain or lower extremity pain: Secondary | ICD-10-CM

## 2021-09-30 DIAGNOSIS — M0579 Rheumatoid arthritis with rheumatoid factor of multiple sites without organ or systems involvement: Secondary | ICD-10-CM | POA: Diagnosis not present

## 2021-09-30 DIAGNOSIS — M797 Fibromyalgia: Secondary | ICD-10-CM | POA: Diagnosis not present

## 2021-09-30 DIAGNOSIS — Z8709 Personal history of other diseases of the respiratory system: Secondary | ICD-10-CM

## 2021-09-30 DIAGNOSIS — M170 Bilateral primary osteoarthritis of knee: Secondary | ICD-10-CM | POA: Diagnosis not present

## 2021-09-30 DIAGNOSIS — M3501 Sicca syndrome with keratoconjunctivitis: Secondary | ICD-10-CM | POA: Diagnosis not present

## 2021-09-30 DIAGNOSIS — M5136 Other intervertebral disc degeneration, lumbar region: Secondary | ICD-10-CM | POA: Diagnosis not present

## 2021-09-30 DIAGNOSIS — Z8719 Personal history of other diseases of the digestive system: Secondary | ICD-10-CM

## 2021-09-30 DIAGNOSIS — Z8679 Personal history of other diseases of the circulatory system: Secondary | ICD-10-CM

## 2021-09-30 DIAGNOSIS — B029 Zoster without complications: Secondary | ICD-10-CM | POA: Diagnosis not present

## 2021-09-30 DIAGNOSIS — Z8659 Personal history of other mental and behavioral disorders: Secondary | ICD-10-CM

## 2021-09-30 DIAGNOSIS — Z79899 Other long term (current) drug therapy: Secondary | ICD-10-CM

## 2021-09-30 MED ORDER — XELJANZ 5 MG PO TABS: 1.0000 | ORAL_TABLET | Freq: Every day | ORAL | 0 refills | Status: DC

## 2021-09-30 NOTE — Patient Instructions (Signed)
Standing Labs We placed an order today for your standing lab work.   Please have your standing labs drawn in May and every 3 months and every 3 months and and every 3 months  If possible, please have your labs drawn 2 weeks prior to your appointment so that the provider can discuss your results at your appointment.  Please note that you may see your imaging and lab results in [facility] before we have reviewed them. We may be awaiting multiple results to interpret others before contacting you. Please allow our office up to 72 hours to thoroughly review all of the results before contacting the office for clarification of your results.  We have open lab daily: Monday through Thursday from 1:30-4:30 PM and Friday from 1:30-4:00 PM at the office of Dr. Bo Merino, [facility].   Please be advised, all patients with office appointments requiring lab work will take precedent over walk-in lab work.  If possible, please come for your lab work on Monday and Friday afternoons, as you may experience shorter wait times. The office is located at [address] [place], [place], [place] [address] No appointment is necessary.   Labs are drawn by [facility]. Please bring your co-pay at the time of your lab draw.  You may receive a bill from [facility] for your lab work.  Please note if you are on Hydroxychloroquine and and an order has been placed for a Hydroxychloroquine level, you will need to have it drawn 4 hours or more after your last dose.  If you wish to have your labs drawn at another location, please call the office 24 hours in advance to send orders.  If you have any questions regarding directions or hours of operation,  please call [phone].   As a reminder, please drink plenty of water prior to coming for your lab work. Thanks!   Vaccines You are taking a medication(s) that can suppress your immune system.  The following immunizations are recommended: Flu annually Covid-19   Td/Tdap (tetanus, diphtheria, pertussis) every 10 years Pneumonia (Prevnar 15 then Pneumovax 23 at least 1 year apart.  Alternatively, can take Prevnar 20 without needing additional dose) Shingrix: 2 doses from 4 weeks to 6 months apart  Please check with your PCP to make sure you are up to date.   If you have signs or symptoms of an infection or start antibiotics: First, call your PCP for workup of your infection. Hold your medication through the infection, until you complete your antibiotics, and until symptoms resolve if you take the following: Injectable medication (Actemra, Benlysta, Cimzia, Cosentyx, Enbrel, Humira, Kevzara, Orencia, Remicade, Simponi, Stelara, Taltz, Tremfya) Methotrexate Leflunomide (Arava) Mycophenolate (Cellcept) Morrie Sheldon, Olumiant, or Rinvoq

## 2021-10-01 NOTE — Progress Notes (Signed)
CBC and CMP are stable.

## 2021-10-04 LAB — CBC WITH DIFFERENTIAL/PLATELET
Absolute Monocytes: 1264 cells/uL — ABNORMAL HIGH (ref 200–950)
Basophils Absolute: 132 cells/uL (ref 0–200)
Basophils Relative: 0.9 %
Eosinophils Absolute: 573 cells/uL — ABNORMAL HIGH (ref 15–500)
Eosinophils Relative: 3.9 %
HCT: 42.4 % (ref 35.0–45.0)
Hemoglobin: 13.6 g/dL (ref 11.7–15.5)
Lymphs Abs: 1940 cells/uL (ref 850–3900)
MCH: 26.4 pg — ABNORMAL LOW (ref 27.0–33.0)
MCHC: 32.1 g/dL (ref 32.0–36.0)
MCV: 82.3 fL (ref 80.0–100.0)
MPV: 10.1 fL (ref 7.5–12.5)
Monocytes Relative: 8.6 %
Neutro Abs: 10790 cells/uL — ABNORMAL HIGH (ref 1500–7800)
Neutrophils Relative %: 73.4 %
Platelets: 363 10*3/uL (ref 140–400)
RBC: 5.15 10*6/uL — ABNORMAL HIGH (ref 3.80–5.10)
RDW: 13 % (ref 11.0–15.0)
Total Lymphocyte: 13.2 %
WBC: 14.7 10*3/uL — ABNORMAL HIGH (ref 3.8–10.8)

## 2021-10-04 LAB — COMPLETE METABOLIC PANEL WITH GFR
AG Ratio: 1.2 (calc) (ref 1.0–2.5)
ALT: 6 U/L (ref 6–29)
AST: 12 U/L (ref 10–35)
Albumin: 3.8 g/dL (ref 3.6–5.1)
Alkaline phosphatase (APISO): 100 U/L (ref 37–153)
BUN/Creatinine Ratio: 25 (calc) — ABNORMAL HIGH (ref 6–22)
BUN: 27 mg/dL — ABNORMAL HIGH (ref 7–25)
CO2: 28 mmol/L (ref 20–32)
Calcium: 9.4 mg/dL (ref 8.6–10.4)
Chloride: 101 mmol/L (ref 98–110)
Creat: 1.06 mg/dL — ABNORMAL HIGH (ref 0.60–0.95)
Globulin: 3.2 g/dL (calc) (ref 1.9–3.7)
Glucose, Bld: 105 mg/dL — ABNORMAL HIGH (ref 65–99)
Potassium: 4 mmol/L (ref 3.5–5.3)
Sodium: 138 mmol/L (ref 135–146)
Total Bilirubin: 0.3 mg/dL (ref 0.2–1.2)
Total Protein: 7 g/dL (ref 6.1–8.1)
eGFR: 53 mL/min/{1.73_m2} — ABNORMAL LOW (ref >=60)

## 2021-10-04 LAB — QUANTIFERON-TB GOLD PLUS
Mitogen-NIL: 7.74 IU/mL
NIL: 0.04 IU/mL
QuantiFERON-TB Gold Plus: NEGATIVE
TB1-NIL: 0.02 IU/mL
TB2-NIL: <0 IU/mL

## 2021-10-04 NOTE — Progress Notes (Signed)
TB Gold is negative.

## 2021-10-09 DIAGNOSIS — Z794 Long term (current) use of insulin: Secondary | ICD-10-CM | POA: Diagnosis not present

## 2021-10-09 DIAGNOSIS — E1142 Type 2 diabetes mellitus with diabetic polyneuropathy: Secondary | ICD-10-CM | POA: Diagnosis not present

## 2021-10-15 ENCOUNTER — Ambulatory Visit: Payer: Medicare Other | Admitting: Podiatry

## 2021-10-15 ENCOUNTER — Other Ambulatory Visit: Payer: Self-pay

## 2021-10-15 ENCOUNTER — Encounter: Payer: Self-pay | Admitting: Podiatry

## 2021-10-15 DIAGNOSIS — B351 Tinea unguium: Secondary | ICD-10-CM | POA: Diagnosis not present

## 2021-10-15 DIAGNOSIS — L97521 Non-pressure chronic ulcer of other part of left foot limited to breakdown of skin: Secondary | ICD-10-CM

## 2021-10-15 DIAGNOSIS — I739 Peripheral vascular disease, unspecified: Secondary | ICD-10-CM

## 2021-10-15 DIAGNOSIS — E11621 Type 2 diabetes mellitus with foot ulcer: Secondary | ICD-10-CM

## 2021-10-15 DIAGNOSIS — M79674 Pain in right toe(s): Secondary | ICD-10-CM

## 2021-10-15 NOTE — Progress Notes (Signed)
Subjective:   Patient ID: Katherine Delgado, female   DOB: 81 y.o.   MRN: [id number]   HPI Patient presents in a relatively poor health who has had diabetic ulceration with keratotic tissue now plantar left hallux and has severely thickened nailbeds dystrophic hallux bilateral she cannot do anything with and she is concerned about these and whether removal should be considered   ROS      Objective:  Physical Exam  Neurovascular status unchanged patient is in wheelchair is suffering from reduced vascular flow does have diminished digital perfusion has very thickened hallux nails bilateral and keratotic tissue plantar left hallux currently with no drainage erythema edema      Assessment:  Chronic condition with port flow poor health and nail disease with mycotic component along with lesion plantar left     Plan:  H&P discussed all conditions debridement of nailbeds accomplished and debridement of lesion no drainage noted I recommended no removal of the nail I educated her on this things we could consider but do not recommend that type of treatment plan at the current time.  Patient was made aware of this will be seen back as needed

## 2021-10-20 ENCOUNTER — Other Ambulatory Visit: Payer: Self-pay | Admitting: Family

## 2021-10-20 DIAGNOSIS — L308 Other specified dermatitis: Secondary | ICD-10-CM | POA: Diagnosis not present

## 2021-10-20 DIAGNOSIS — L218 Other seborrheic dermatitis: Secondary | ICD-10-CM | POA: Diagnosis not present

## 2021-10-20 NOTE — Telephone Encounter (Signed)
Patient has request refill on medication "Amlodipine 10mg ". Patient last refill on medication dated 04/27/2021. Patient medication has Warnings. Medication pend and sent to PCP Ngetich, Nelda Bucks, NP . ?

## 2021-10-22 ENCOUNTER — Telehealth: Payer: Self-pay

## 2021-10-22 ENCOUNTER — Other Ambulatory Visit: Payer: Self-pay | Admitting: Physical Medicine and Rehabilitation

## 2021-10-22 MED ORDER — OXYCODONE-ACETAMINOPHEN 10-325 MG PO TABS: 1.0000 | ORAL_TABLET | Freq: Four times a day (QID) | ORAL | 0 refills | Status: DC | PRN

## 2021-10-22 NOTE — Telephone Encounter (Signed)
PMP was Reviewed.  ?Dr Ranell Patrick note was reviewed.  ?Oxycodone e-scribed today.  ?Placed a call to Ms. Waller regarding the above, she verbalizes understanding.  ?

## 2021-10-22 NOTE — Addendum Note (Signed)
Addended by: Bayard Hugger on: 10/22/2021 04:45 PM ? ? Modules accepted: Orders ? ?

## 2021-10-22 NOTE — Telephone Encounter (Signed)
PMP REPORT: ? ?Filled  Written  ID  Drug  QTY  Days  Prescriber  RX #  Dispenser  Refill  Daily Dose*  Pymt Type  PMP  ?09/21/2021 09/21/2021 1  ?Oxycodone-Acetaminophen 10-325 ?120.00 [address] [id number] [facility] [phone]) 0/0 60.00 MME Medicare [place] ?08/21/2021 08/02/2021 1  ?Oxycodone-Acetaminophen 10-325 ?120.00 [address] [id number] [facility] ([id number]) 0/0 60.00 MME Medicare  ? ?Patient called and she is out of pain medication. She has been reminded of Friday afternoon refill request.  ?

## 2021-10-27 DIAGNOSIS — R531 Weakness: Secondary | ICD-10-CM | POA: Diagnosis not present

## 2021-10-27 DIAGNOSIS — M4801 Spinal stenosis, occipito-atlanto-axial region: Secondary | ICD-10-CM | POA: Diagnosis not present

## 2021-11-09 DIAGNOSIS — E1142 Type 2 diabetes mellitus with diabetic polyneuropathy: Secondary | ICD-10-CM | POA: Diagnosis not present

## 2021-11-09 DIAGNOSIS — Z794 Long term (current) use of insulin: Secondary | ICD-10-CM | POA: Diagnosis not present

## 2021-11-22 ENCOUNTER — Other Ambulatory Visit: Payer: Self-pay | Admitting: Rheumatology

## 2021-11-22 NOTE — Telephone Encounter (Signed)
Next Visit: 12/28/2021 ? ?Last Visit: 09/30/2021 ? ?Last Fill: 09/30/2021 ? ?DX: Rheumatoid arthritis involving multiple sites with positive rheumatoid factor  ? ?Current Dose per office note 09/30/2021: Katherine Delgado 5 mg 1 tablet by mouth daily ? ?Labs: 09/30/2021 CBC and CMP are stable ? ?TB Gold: 09/30/2021 Neg ? ?Okay to refill Katherine Delgado?  ?

## 2021-12-10 DIAGNOSIS — Z794 Long term (current) use of insulin: Secondary | ICD-10-CM | POA: Diagnosis not present

## 2021-12-10 DIAGNOSIS — E1142 Type 2 diabetes mellitus with diabetic polyneuropathy: Secondary | ICD-10-CM | POA: Diagnosis not present

## 2021-12-14 ENCOUNTER — Encounter: Payer: Self-pay | Admitting: Physical Medicine and Rehabilitation

## 2021-12-14 ENCOUNTER — Encounter
Payer: Medicare Other | Attending: Physical Medicine and Rehabilitation | Admitting: Physical Medicine and Rehabilitation

## 2021-12-14 VITALS — BP 146/75 | HR 69 | Ht 66.0 in | Wt 220.8 lb

## 2021-12-14 DIAGNOSIS — G894 Chronic pain syndrome: Secondary | ICD-10-CM | POA: Insufficient documentation

## 2021-12-14 DIAGNOSIS — Z5181 Encounter for therapeutic drug level monitoring: Secondary | ICD-10-CM | POA: Insufficient documentation

## 2021-12-14 DIAGNOSIS — Z79891 Long term (current) use of opiate analgesic: Secondary | ICD-10-CM | POA: Diagnosis not present

## 2021-12-14 MED ORDER — AMITRIPTYLINE HCL 50 MG PO TABS: 50.0000 mg | ORAL_TABLET | Freq: Every day | ORAL | 3 refills | Status: AC

## 2021-12-14 MED ORDER — LIDOCAINE 5 % EX PTCH: MEDICATED_PATCH | CUTANEOUS | 3 refills | Status: DC

## 2021-12-14 MED ORDER — OXYCODONE-ACETAMINOPHEN 10-325 MG PO TABS
1.0000 | ORAL_TABLET | Freq: Four times a day (QID) | ORAL | 0 refills | Status: DC | PRN
Start: 2021-12-14 — End: 2022-01-11

## 2021-12-14 NOTE — Progress Notes (Signed)
? ?Subjective:  ? ? Patient ID: Katherine Delgado, female    DOB: Mar 07, 1941, 81 y.o.   MRN: [id number] ? ?HPI: Katherine Delgado is a 81 y.o. female who returns for follow-up for chronic pain and medication refill. She states her pain is located in her bilateral shoulders, lower back pain radiating into his left hip. Her current exercise regime is receiving PT/OT two days a week. ? ?1) RUE post-herpetic neuralgia ?Ms. Verner Morphine equivalent is 60.00  MME. Her pain medications are not helping her right hand post-shingles pain. The pain has gotten better but it still can be sever and feels like someone beat her with a hammer. Pain radiates up into the elbow and shoulder. The pain can be excruciating. She asks if we can add something else.  ?-no benefit from [place].  ?-pain is currently worst in her elbow ?-radiates all the way into her hand ? ?She was hospitalized with pneumonia since I saw her last. She was also told she had shingles, and she is toward the end of it now. Her fingers still have decreased strength on her right side- she does note improvements. She cannot get her right thumb to elevated. She will be seeing Dr. Estanislado Pandy. She has been unable to write.  ? ?She has been using cranberry for her incontinence.  ? ?2) Failed back syndrome ?-due for refill of Percocet early next month ?-she hates to think that she will be in pain for the rest of her years ?-it continues to provide her relief ?-provided with pain journal last visit ?-her son accompanies her today ?-she did obtain relief from the lidocaine patch before ?-combination of tylenol and Percocet is working for her ? ?3) Insomnia ?-not sleeping at night ?-she is taking the sleeping medication I prescribed ?-she feels if she can get some sleep that is great ?-we increased the Amitriptyline to '50mg'$  at once point ?-she know that sleep is a major factor.  ? ?  ?Pain Inventory ?Average Pain 8 ?Pain Right Now 9 ?My pain is sharp, burning, stabbing, and  aching ? ?In the last 24 hours, has pain interfered with the following? ?General activity 10 ?Relation with others 10 ?Enjoyment of life 10 ?What TIME of day is your pain at its worst? night ?Sleep (in general) Poor ? ?Pain is worse with: walking, bending, sitting, standing, and some activites ?Pain improves with: rest, therapy/exercise, and medication ?Relief from Meds: 3 ? ?Family History  ?Problem Relation Age of Onset  ? Alzheimer's disease Mother   ? Heart disease Mother   ? Heart disease Father   ? Liver disease Father   ? Cancer Brother   ? Arthritis Son   ? Colon polyps Brother   ? Colon cancer Neg Hx   ? Esophageal cancer Neg Hx   ? Kidney disease Neg Hx   ? Stomach cancer Neg Hx   ? Rectal cancer Neg Hx   ? ?Social History  ? ?Socioeconomic History  ? Marital status: Widowed  ?  Spouse name: Not on file  ? Number of children: 2  ? Years of education: 63  ? Highest education level: Not on file  ?Occupational History  ? Occupation: Retired  ?Tobacco Use  ? Smoking status: Former  ?  Packs/day: 0.10  ?  Years: 10.00  ?  Pack years: 1.00  ?  Types: Cigarettes  ?  Quit date: 12/07/1979  ?  Years since quitting: 42.0  ?  Passive exposure: Current  ?  Smokeless tobacco: Never  ?Vaping Use  ? Vaping Use: Never used  ?Substance and Sexual Activity  ? Alcohol use: No  ?  Alcohol/week: 0.0 standard drinks  ? Drug use: No  ? Sexual activity: Not Currently  ?Other Topics Concern  ? Not on file  ?Social History Narrative  ? Walks with cane  ? Right handed   ? Caffeine use: Coffee (2 cups every morning)  ? Tea: sometimes  ? Soda: none  ? ?Social Determinants of Health  ? ?Financial Resource Strain: Not on file  ?Food Insecurity: Not on file  ?Transportation Needs: Not on file  ?Physical Activity: Not on file  ?Stress: Not on file  ?Social Connections: Not on file  ? ?Past Surgical History:  ?Procedure Laterality Date  ? ABDOMINAL HYSTERECTOMY  1974  ? abdominal tumor  2002  ? APPENDECTOMY    ? APPLICATION OF A-CELL OF BACK  N/A 09/26/2018  ? Procedure: With Acell;  Surgeon: Wallace Going, DO;  Location: [facility] [place];  Service: Plastics;  Laterality: N/A;  ? APPLICATION OF WOUND VAC N/A 09/26/2018  ? Procedure: Vac placement;  Surgeon: Wallace Going, DO;  Location: [facility];  Service: Plastics;  Laterality: N/A;  ? [place]  ? BRONCHOSCOPY  2001  ? CATARACT EXTRACTION, BILATERAL  09/2019, 10/2019  ? CHOLECYSTECTOMY  1984  ? INCISION AND DRAINAGE OF WOUND N/A 09/26/2018  ? Procedure: Debridement of spine wound;  Surgeon: Wallace Going, DO;  Location: [facility];  Service: Plastics;  Laterality: N/A;  ? KNEE SURGERY Bilateral 08/27/2010 (L) and 01/11/2011 (R)  ? LUMBAR LAMINECTOMY/DECOMPRESSION MICRODISCECTOMY N/A 07/03/2018  ? Procedure: Lumbar Three to Lumbar Five Laminectomy;  Surgeon: Judith Part, MD;  Location: [facility];  Service: Neurosurgery;  Laterality: N/A;  ? LUMBAR WOUND DEBRIDEMENT N/A 08/20/2018  ? Procedure: POSTERIOR LUMBAR SPINAL WOUND DEBRIDEMENT AND REVISION;  Surgeon: Judith Part, MD;  Location: [facility];  Service: Neurosurgery;  Laterality: N/A;  POSTERIOR LUMBAR SPINAL WOUND REVISION  ? [place]  ? OVARIAN CYST SURGERY  1968  ? thymus tumor    ? 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  ? TONSILLECTOMY    ? ?Past Surgical History:  ?Procedure Laterality Date  ? ABDOMINAL HYSTERECTOMY  1974  ? abdominal tumor  2002  ? APPENDECTOMY    ? APPLICATION OF A-CELL OF BACK N/A 09/26/2018  ? Procedure: With Acell;  Surgeon: Wallace Going, DO;  Location: [facility];  Service: Plastics;  Laterality: N/A;  ? APPLICATION OF WOUND VAC N/A 09/26/2018  ? Procedure: Vac placement;  Surgeon: Wallace Going, DO;  Location: [place];  Service: Plastics;  Laterality: N/A;  ? [place]  ? BRONCHOSCOPY  2001  ? CATARACT EXTRACTION, BILATERAL  09/2019, 10/2019  ? CHOLECYSTECTOMY  1984  ? INCISION AND DRAINAGE OF WOUND N/A 09/26/2018  ? Procedure: Debridement of spine wound;  Surgeon: Wallace Going, DO;  Location: [facility];   Service: Plastics;  Laterality: N/A;  ? KNEE SURGERY Bilateral 08/27/2010 (L) and 01/11/2011 (R)  ? LUMBAR LAMINECTOMY/DECOMPRESSION MICRODISCECTOMY N/A 07/03/2018  ? Procedure: Lumbar Three to Lumbar Five Laminectomy;  Surgeon: Judith Part, MD;  Location: [place];  Service: Neurosurgery;  Laterality: N/A;  ? LUMBAR WOUND DEBRIDEMENT N/A 08/20/2018  ? Procedure: POSTERIOR LUMBAR SPINAL WOUND DEBRIDEMENT AND REVISION;  Surgeon: Judith Part, MD;  Location: [facility];  Service: Neurosurgery;  Laterality: N/A;  POSTERIOR LUMBAR SPINAL WOUND REVISION  ?  [place]  ? OVARIAN CYST SURGERY  1968  ? thymus tumor    ? 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  ? TONSILLECTOMY    ? ?Past Medical History:  ?Diagnosis Date  ? Abnormality of gait 04/19/2016  ? Acute infective polyneuritis ([place]) 2002  ? Allergic rhinitis due to pollen   ? Chronic pain syndrome   ? COPD (chronic obstructive pulmonary disease) ([place])   ? chronic bronchitis  ? Depressive disorder, not elsewhere classified   ? Diabetes mellitus without complication ([place])   ? Diaphragmatic hernia without mention of obstruction or gangrene   ? Dyslipidemia   ? Fibromyalgia   ? GERD (gastroesophageal reflux disease)   ? with h/o esophagitis  ? Guillain-Barre syndrome ([place])   ? History of benign thymus tumor   ? Insomnia, unspecified   ? Lumbar spinal stenosis 03/30/2018  ? L4-5 level, severe  ? Miscarriage 1962  ? Mixed hyperlipidemia   ? Morbid obesity ([place])   ? Osteoporosis   ? Pneumonia   ? Polyneuropathy in diabetes(357.2)   ? Rheumatoid arthritis(714.0)   ? Spondylosis, lumbosacral   ? Spontaneous ecchymoses   ? Type II or unspecified type diabetes mellitus with peripheral circulatory disorders, uncontrolled(250.72)   ? Unspecified chronic bronchitis ([place])   ? Unspecified essential hypertension   ? Unspecified hypothyroidism   ? Unspecified pruritic disorder   ? Unspecified urinary incontinence   ? ?Ht '5\' 6"'$  (1.676 m)   BMI 33.89 kg/m?  ? ?Opioid Risk Score:   ?Fall Risk  Score:  `1 ? ?Depression screen PHQ 2/9 ? ? ?  08/02/2021  ? 10:15 AM 06/29/2021  ? 10:03 AM 06/01/2021  ? 10:00 AM 04/27/2021  ? 10:47 AM 03/11/2021  ? 11:01 AM 02/10/2021  ? 11:10 AM 01/19/2021  ?  9:40 AM  ?Depr

## 2021-12-14 NOTE — Patient Instructions (Addendum)
Insomnia: ?-Try to go outside near sunrise ?-Get exercise during the day.  ?-Turn off all devices an hour before bedtime.  ?-Teas that can benefit: chamomile, valerian root, Brahmi (Bacopa) ?-Can consider over the counter melatonin, magnesium, and/or L-theanine. Melatonin is an anti-oxidant with multiple health benefits. Magnesium is involved in greater than 300 enzymatic reactions in the body and most of [place] are deficient as our soil is often depleted. There are 7 different types of magnesium- Bioptemizer's is a supplement with all 7 types, and each has unique benefits. Magnesium can also help with constipation and anxiety.  ?-Pistachios naturally increase the production of melatonin ?-Cozy Earth bamboo bed sheets are free from toxic chemicals.  ?-Tart cherry juice or a tart cherry supplement can improve sleep and soreness post-workout ? ?-Discussed following foods that may reduce pain: ?1) Ginger (especially studied for arthritis)- reduce leukotriene production to decrease inflammation ?2) Blueberries- high in phytonutrients that decrease inflammation ?3) Salmon- marine omega-3s reduce joint swelling and pain ?4) Pumpkin seeds- reduce inflammation ?5) dark chocolate- reduces inflammation ?6) turmeric- reduces inflammation ?7) tart cherries - reduce pain and stiffness ?8) extra virgin olive oil - its compound olecanthal helps to block prostaglandins  ?9) chili peppers- can be eaten or applied topically via capsaicin ?10) mint- helpful for headache, muscle aches, joint pain, and itching ?11) garlic- reduces inflammation ? ?Link to further information on diet for chronic pain: http://www.randall.com/  ? ?-recommended doTerra Deep Blue Essential oil and applied to area of pain today- discussed that this is made of natural plant oils. Shared by personal experience of benefit from use of this essential oil ?-provided a link to a pdf of Pete Escogue's  musculoskeletal alignment exercises: https://www.berger.biz/.pdf  ?  ?

## 2021-12-14 NOTE — Progress Notes (Deleted)
Office Visit Note  Patient: Katherine Delgado             Date of Birth: Oct 16, 1940           MRN: [id number]             PCP: Sandrea Hughs, NP Referring: Sandrea Hughs, NP Visit Date: 12/28/2021 Occupation: '@GUAROCC'$ @  Subjective:  No chief complaint on file.   History of Present Illness: Katherine Delgado is a 81 y.o. female ***   Activities of Daily Living:  Patient reports morning stiffness for *** {minute/hour:19697}.   Patient {ACTIONS;DENIES/REPORTS:21021675::"Denies"} nocturnal pain.  Difficulty dressing/grooming: {ACTIONS;DENIES/REPORTS:21021675::"Denies"} Difficulty climbing stairs: {ACTIONS;DENIES/REPORTS:21021675::"Denies"} Difficulty getting out of chair: {ACTIONS;DENIES/REPORTS:21021675::"Denies"} Difficulty using hands for taps, buttons, cutlery, and/or writing: {ACTIONS;DENIES/REPORTS:21021675::"Denies"}  No Rheumatology ROS completed.   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[place]) 02/10/2020   Primary osteoarthritis of right knee 07/25/2019   Wound dehiscence 08/17/2018   Lumbar spinal stenosis 03/30/2018   Rheumatoid arthritis involving both wrists with positive rheumatoid factor ([place]) 06/14/2016   High risk medication use 06/14/2016   Sjogren's syndrome ([place]) 06/14/2016   Primary osteoarthritis of both knees 06/14/2016   DDD (degenerative disc disease), lumbar 06/14/2016   Hypothyroidism 06/14/2016   Osteoporosis 06/14/2016   Abnormality of gait 04/19/2016   Type 2 diabetes mellitus with diabetic polyneuropathy, with long-term current use of insulin ([place]) 11/27/2015   Diabetes mellitus without complication ([place]) 76/72/0947   Headache(784.0) 07/31/2013   Sinus infection 07/31/2013   Chronic right-sided low back pain with right-sided  sciatica 03/14/2013   Insomnia 12/06/2012   Nausea alone 12/06/2012   Diarrhea 12/06/2012   Urinary incontinence, urge 12/06/2012   Lumbosacral root lesions, not elsewhere classified 11/27/2012   Diabetic polyneuropathy associated with type 2 diabetes mellitus ([place]) 11/27/2012   EDEMA 05/04/2010   YEAST INFECTION 05/03/2010   Hyperlipidemia 05/03/2010   DEPRESSION 05/03/2010   History of peripheral neuropathy 05/03/2010   Essential hypertension 05/03/2010   ALLERGIC RHINITIS 05/03/2010   PNEUMONIA 05/03/2010   COPD (chronic obstructive pulmonary disease) ([place]) 05/03/2010   GERD 05/03/2010   Fibromyalgia 05/03/2010   DYSPNEA 05/03/2010   CHEST PAIN 05/03/2010    Past Medical History:  Diagnosis Date   Abnormality of gait 04/19/2016   Acute infective polyneuritis ([place]) 2002   Allergic rhinitis due to pollen    Chronic pain syndrome    COPD (chronic obstructive pulmonary disease) (HCC)    chronic bronchitis   Depressive disorder, not elsewhere classified    Diabetes mellitus without complication ([place])    Diaphragmatic hernia without mention of obstruction or gangrene    Dyslipidemia    Fibromyalgia    GERD (gastroesophageal reflux disease)    with h/o esophagitis   Guillain-Barre syndrome ([place])    History of benign thymus tumor    Insomnia, unspecified    Lumbar spinal stenosis 03/30/2018   L4-5 level, severe   Miscarriage 1962   Mixed hyperlipidemia    Morbid obesity ([place])    Osteoporosis    Pneumonia    Polyneuropathy in diabetes(357.2)    Rheumatoid arthritis(714.0)    Spondylosis, lumbosacral    Spontaneous ecchymoses    Type II or unspecified type diabetes mellitus with peripheral circulatory disorders, uncontrolled(250.72)    Unspecified chronic bronchitis ([place])  Unspecified essential hypertension    Unspecified hypothyroidism    Unspecified pruritic disorder    Unspecified urinary incontinence     Family History  Problem Relation Age of Onset    Alzheimer's disease Mother    Heart disease Mother    Heart disease Father    Liver disease Father    Cancer Brother    Arthritis Son    Colon polyps Brother    Colon cancer Neg Hx    Esophageal cancer Neg Hx    Kidney disease Neg Hx    Stomach cancer Neg Hx    Rectal cancer Neg Hx    Past Surgical History:  Procedure Laterality Date   ABDOMINAL HYSTERECTOMY  1974   abdominal tumor  2002   APPENDECTOMY     APPLICATION OF A-CELL OF BACK N/A 09/26/2018   Procedure: With Acell;  Surgeon: Wallace Going, DO;  Location: [facility];  Service: Plastics;  Laterality: N/A;   APPLICATION OF WOUND VAC N/A 09/26/2018   Procedure: Vac placement;  Surgeon: Wallace Going, DO;  Location: [facility];  Service: Plastics;  Laterality: N/A;   BACK SURGERY  1982   BRONCHOSCOPY  2001   CATARACT EXTRACTION, BILATERAL  09/2019, 10/2019   CHOLECYSTECTOMY  1984   INCISION AND DRAINAGE OF WOUND N/A 09/26/2018   Procedure: Debridement of spine wound;  Surgeon: Wallace Going, DO;  Location: [facility];  Service: Plastics;  Laterality: N/A;   KNEE SURGERY Bilateral 08/27/2010 (L) and 01/11/2011 (R)   LUMBAR LAMINECTOMY/DECOMPRESSION MICRODISCECTOMY N/A 07/03/2018   Procedure: Lumbar Three to Lumbar Five Laminectomy;  Surgeon: Judith Part, MD;  Location: [facility];  Service: Neurosurgery;  Laterality: N/A;   LUMBAR WOUND DEBRIDEMENT N/A 08/20/2018   Procedure: POSTERIOR LUMBAR SPINAL WOUND DEBRIDEMENT AND REVISION;  Surgeon: Judith Part, MD;  Location: [facility];  Service: Neurosurgery;  Laterality: N/A;  POSTERIOR LUMBAR SPINAL WOUND REVISION   miscarrage  1962   OVARIAN CYST SURGERY  1968   thymus tumor     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   TONSILLECTOMY     Social History   Social History Narrative   Walks with cane   Right handed    Caffeine use: Coffee (2 cups every morning)   Tea: sometimes   Soda: none   Immunization History  Administered Date(s) Administered   Influenza-Unspecified 06/10/2011    PFIZER(Purple Top)SARS-COV-2 Vaccination 11/13/2019, 11/30/2019   Pneumococcal Conjugate-13 07/07/2016   Pneumococcal Polysaccharide-23 11/21/2003, 10/05/2017   Tdap 05/28/2016     Objective: Vital Signs: There were no vitals taken for this visit.   Physical Exam   Musculoskeletal Exam: ***  CDAI Exam: CDAI Score: -- Patient Global: --; Provider Global: -- Swollen: --; Tender: -- Joint Exam 12/28/2021   No joint exam has been documented for this visit   There is currently no information documented on the homunculus. Go to the Rheumatology activity and complete the homunculus joint exam.  Investigation: No additional findings.  Imaging: No results found.  Recent Labs: Lab Results  Component Value Date   WBC 14.7 (H) 09/30/2021   HGB 13.6 09/30/2021   PLT 363 09/30/2021   NA 138 09/30/2021   K 4.0 09/30/2021   CL 101 09/30/2021   CO2 28 09/30/2021   GLUCOSE 105 (H) 09/30/2021   BUN 27 (H) 09/30/2021   CREATININE 1.06 (H) 09/30/2021   BILITOT 0.3 09/30/2021   ALKPHOS 96 01/25/2021   AST 12 09/30/2021   ALT 6 09/30/2021  PROT 7.0 09/30/2021   ALBUMIN 3.5 01/25/2021   CALCIUM 9.4 09/30/2021   GFRAA 65 02/12/2021   QFTBGOLDPLUS NEGATIVE 09/30/2021    Speciality Comments: No specialty comments available.  Procedures:  No procedures performed Allergies: Influenza vaccines, Penicillins, Statins, and Sulfamethoxazole-trimethoprim   Assessment / Plan:     Visit Diagnoses: Rheumatoid arthritis involving multiple sites with positive rheumatoid factor (HCC)  High risk medication use  Sjogren's syndrome with keratoconjunctivitis sicca (HCC)  Primary osteoarthritis of both knees  DDD (degenerative disc disease), lumbar  Age-related osteoporosis without current pathological fracture  Fibromyalgia  Other insomnia  Herpes zoster without complication  History of diabetes mellitus  History of Guillain-Barre syndrome  History of COPD  History of  gastroesophageal reflux (GERD)  History of hypertension  History of depression  History of peripheral neuropathy  Orders: No orders of the defined types were placed in this encounter.  No orders of the defined types were placed in this encounter.   Face-to-face time spent with patient was *** minutes. Greater than 50% of time was spent in counseling and coordination of care.  Follow-Up Instructions: No follow-ups on file.   Ofilia Neas, PA-C  Note - This record has been created using Dragon software.  Chart creation errors have been sought, but may not always  have been located. Such creation errors do not reflect on  the standard of medical care.

## 2021-12-19 LAB — TOXASSURE SELECT,+ANTIDEPR,UR

## 2021-12-20 ENCOUNTER — Telehealth: Payer: Self-pay | Admitting: *Deleted

## 2021-12-20 NOTE — Telephone Encounter (Signed)
Urine drug screen for this encounter is consistent for prescribed medication 

## 2021-12-28 ENCOUNTER — Ambulatory Visit: Payer: Medicare Other | Admitting: Physician Assistant

## 2021-12-28 DIAGNOSIS — M0579 Rheumatoid arthritis with rheumatoid factor of multiple sites without organ or systems involvement: Secondary | ICD-10-CM

## 2021-12-28 DIAGNOSIS — M170 Bilateral primary osteoarthritis of knee: Secondary | ICD-10-CM

## 2021-12-28 DIAGNOSIS — M5136 Other intervertebral disc degeneration, lumbar region: Secondary | ICD-10-CM

## 2021-12-28 DIAGNOSIS — Z8709 Personal history of other diseases of the respiratory system: Secondary | ICD-10-CM

## 2021-12-28 DIAGNOSIS — M3501 Sicca syndrome with keratoconjunctivitis: Secondary | ICD-10-CM

## 2021-12-28 DIAGNOSIS — M810 Age-related osteoporosis without current pathological fracture: Secondary | ICD-10-CM

## 2021-12-28 DIAGNOSIS — Z8659 Personal history of other mental and behavioral disorders: Secondary | ICD-10-CM

## 2021-12-28 DIAGNOSIS — Z8669 Personal history of other diseases of the nervous system and sense organs: Secondary | ICD-10-CM

## 2021-12-28 DIAGNOSIS — Z8639 Personal history of other endocrine, nutritional and metabolic disease: Secondary | ICD-10-CM

## 2021-12-28 DIAGNOSIS — G4709 Other insomnia: Secondary | ICD-10-CM

## 2021-12-28 DIAGNOSIS — B029 Zoster without complications: Secondary | ICD-10-CM

## 2021-12-28 DIAGNOSIS — M797 Fibromyalgia: Secondary | ICD-10-CM

## 2021-12-28 DIAGNOSIS — Z8719 Personal history of other diseases of the digestive system: Secondary | ICD-10-CM

## 2021-12-28 DIAGNOSIS — Z8679 Personal history of other diseases of the circulatory system: Secondary | ICD-10-CM

## 2021-12-28 DIAGNOSIS — Z79899 Other long term (current) drug therapy: Secondary | ICD-10-CM

## 2021-12-29 NOTE — Progress Notes (Deleted)
Office Visit Note  Patient: Katherine Delgado             Date of Birth: 27-Dec-1940           MRN: [id number]             PCP: Sandrea Hughs, NP Referring: Sandrea Hughs, NP Visit Date: 01/06/2022 Occupation: '@GUAROCC'$ @  Subjective:    History of Present Illness: Katherine Delgado is a 81 y.o. female with history of seropositive rheumatoid arthritis, Sjogren's syndrome, fibromyalgia, osteoarthritis, and osteoporosis.  Patient is currently taking Xeljanz 5 mg 1 tablet daily.  CBC and CMP drawn on 09/30/2021.  Orders for CBC and CMP released today.  Her next lab work will be due in August and every 3 months to monitor for drug toxicity.  Standing orders for CBC and CMP remain in place. TB Gold negative on 09/30/2021. Counseled on the increase risk of venous thrombosis. Counseled about FDA black box warning of MACE (major adverse CV events including cardiovascular death, myocardial infarction, and stroke).  Reviewed with patient that there is the possibility of an increased risk of malignancy specifically lung cancer and lymphomas but it is not well understood if this increased risk is due to the medication or the disease state.  Discussed the importance of holding xeljanz if she develops signs or symptoms of an infection and to resume once the infection has completely cleared.    Activities of Daily Living:  Patient reports morning stiffness for *** {minute/hour:19697}.   Patient {ACTIONS;DENIES/REPORTS:21021675::"Denies"} nocturnal pain.  Difficulty dressing/grooming: {ACTIONS;DENIES/REPORTS:21021675::"Denies"} Difficulty climbing stairs: {ACTIONS;DENIES/REPORTS:21021675::"Denies"} Difficulty getting out of chair: {ACTIONS;DENIES/REPORTS:21021675::"Denies"} Difficulty using hands for taps, buttons, cutlery, and/or writing: {ACTIONS;DENIES/REPORTS:21021675::"Denies"}  No Rheumatology ROS completed.   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/10/2020   Primary osteoarthritis of right knee 07/25/2019   Wound dehiscence 08/17/2018   Lumbar spinal stenosis 03/30/2018   Rheumatoid arthritis involving both wrists with positive rheumatoid factor ([place]) 06/14/2016   High risk medication use 06/14/2016   Sjogren's syndrome ([place]) 06/14/2016   Primary osteoarthritis of both knees 06/14/2016   DDD (degenerative disc disease), lumbar 06/14/2016   Hypothyroidism 06/14/2016   Osteoporosis 06/14/2016   Abnormality of gait 04/19/2016   Type 2 diabetes mellitus with diabetic polyneuropathy, with long-term current use of insulin ([place]) 11/27/2015   Diabetes mellitus without complication ([place]) 74/25/9563   Headache(784.0) 07/31/2013   Sinus infection 07/31/2013   Chronic right-sided low back pain with right-sided sciatica 03/14/2013   Insomnia 12/06/2012   Nausea alone 12/06/2012   Diarrhea 12/06/2012   Urinary incontinence, urge 12/06/2012   Lumbosacral root lesions, not elsewhere classified 11/27/2012   Diabetic polyneuropathy associated with type 2 diabetes mellitus ([place]) 11/27/2012   EDEMA 05/04/2010   YEAST INFECTION 05/03/2010   Hyperlipidemia 05/03/2010   DEPRESSION 05/03/2010   History of peripheral neuropathy 05/03/2010   Essential hypertension 05/03/2010   ALLERGIC RHINITIS 05/03/2010   PNEUMONIA 05/03/2010   COPD (chronic obstructive pulmonary disease) ([place]) 05/03/2010   GERD 05/03/2010   Fibromyalgia 05/03/2010   DYSPNEA 05/03/2010   CHEST PAIN 05/03/2010    Past Medical History:  Diagnosis Date   Abnormality of gait 04/19/2016   Acute infective polyneuritis ([place]) 2002   Allergic rhinitis due to pollen    Chronic  pain syndrome    COPD (chronic obstructive pulmonary disease) (HCC)    chronic bronchitis   Depressive disorder, not elsewhere  classified    Diabetes mellitus without complication (HCC)    Diaphragmatic hernia without mention of obstruction or gangrene    Dyslipidemia    Fibromyalgia    GERD (gastroesophageal reflux disease)    with h/o esophagitis   Guillain-Barre syndrome (HCC)    History of benign thymus tumor    Insomnia, unspecified    Lumbar spinal stenosis 03/30/2018   L4-5 level, severe   Miscarriage 1962   Mixed hyperlipidemia    Morbid obesity ([place])    Osteoporosis    Pneumonia    Polyneuropathy in diabetes(357.2)    Rheumatoid arthritis(714.0)    Spondylosis, lumbosacral    Spontaneous ecchymoses    Type II or unspecified type diabetes mellitus with peripheral circulatory disorders, uncontrolled(250.72)    Unspecified chronic bronchitis (HCC)    Unspecified essential hypertension    Unspecified hypothyroidism    Unspecified pruritic disorder    Unspecified urinary incontinence     Family History  Problem Relation Age of Onset   Alzheimer's disease Mother    Heart disease Mother    Heart disease Father    Liver disease Father    Cancer Brother    Arthritis Son    Colon polyps Brother    Colon cancer Neg Hx    Esophageal cancer Neg Hx    Kidney disease Neg Hx    Stomach cancer Neg Hx    Rectal cancer Neg Hx    Past Surgical History:  Procedure Laterality Date   ABDOMINAL HYSTERECTOMY  1974   abdominal tumor  2002   APPENDECTOMY     APPLICATION OF A-CELL OF BACK N/A 09/26/2018   Procedure: With Acell;  Surgeon: Wallace Going, DO;  Location: [place];  Service: Plastics;  Laterality: N/A;   APPLICATION OF WOUND VAC N/A 09/26/2018   Procedure: Vac placement;  Surgeon: Wallace Going, DO;  Location: [place];  Service: Plastics;  Laterality: N/A;   BACK SURGERY  1982   BRONCHOSCOPY  2001   CATARACT EXTRACTION, BILATERAL  09/2019, 10/2019   CHOLECYSTECTOMY  1984   INCISION AND DRAINAGE OF WOUND N/A 09/26/2018   Procedure: Debridement of spine wound;  Surgeon: Wallace Going,  DO;  Location: [place];  Service: Plastics;  Laterality: N/A;   KNEE SURGERY Bilateral 08/27/2010 (L) and 01/11/2011 (R)   LUMBAR LAMINECTOMY/DECOMPRESSION MICRODISCECTOMY N/A 07/03/2018   Procedure: Lumbar Three to Lumbar Five Laminectomy;  Surgeon: Judith Part, MD;  Location: [place];  Service: Neurosurgery;  Laterality: N/A;   LUMBAR WOUND DEBRIDEMENT N/A 08/20/2018   Procedure: POSTERIOR LUMBAR SPINAL WOUND DEBRIDEMENT AND REVISION;  Surgeon: Judith Part, MD;  Location: [place];  Service: Neurosurgery;  Laterality: N/A;  POSTERIOR LUMBAR SPINAL WOUND REVISION   miscarrage  1962   OVARIAN CYST SURGERY  1968   thymus tumor     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   TONSILLECTOMY     Social History   Social History Narrative   Walks with cane   Right handed    Caffeine use: Coffee (2 cups every morning)   Tea: sometimes   Soda: none   Immunization History  Administered Date(s) Administered   Influenza-Unspecified 06/10/2011   PFIZER(Purple Top)SARS-COV-2 Vaccination 11/13/2019, 11/30/2019   Pneumococcal Conjugate-13 07/07/2016   Pneumococcal Polysaccharide-23 11/21/2003, 10/05/2017   Tdap 05/28/2016     Objective: Vital Signs: There were  no vitals taken for this visit.   Physical Exam Vitals and nursing note reviewed.  Constitutional:      Appearance: She is well-developed.  HENT:     Head: Normocephalic and atraumatic.  Eyes:     Conjunctiva/sclera: Conjunctivae normal.  Cardiovascular:     Rate and Rhythm: Normal rate and regular rhythm.     Heart sounds: Normal heart sounds.  Pulmonary:     Effort: Pulmonary effort is normal.     Breath sounds: Normal breath sounds.  Abdominal:     General: Bowel sounds are normal.     Palpations: Abdomen is soft.  Musculoskeletal:     Cervical back: Normal range of motion.  Skin:    General: Skin is warm and dry.     Capillary Refill: Capillary refill takes less than 2 seconds.  Neurological:     Mental Status: She is alert and  oriented to person, place, and time.  Psychiatric:        Behavior: Behavior normal.     Musculoskeletal Exam: ***  CDAI Exam: CDAI Score: -- Patient Global: --; Provider Global: -- Swollen: --; Tender: -- Joint Exam 01/06/2022   No joint exam has been documented for this visit   There is currently no information documented on the homunculus. Go to the Rheumatology activity and complete the homunculus joint exam.  Investigation: No additional findings.  Imaging: No results found.  Recent Labs: Lab Results  Component Value Date   WBC 14.7 (H) 09/30/2021   HGB 13.6 09/30/2021   PLT 363 09/30/2021   NA 138 09/30/2021   K 4.0 09/30/2021   CL 101 09/30/2021   CO2 28 09/30/2021   GLUCOSE 105 (H) 09/30/2021   BUN 27 (H) 09/30/2021   CREATININE 1.06 (H) 09/30/2021   BILITOT 0.3 09/30/2021   ALKPHOS 96 01/25/2021   AST 12 09/30/2021   ALT 6 09/30/2021   PROT 7.0 09/30/2021   ALBUMIN 3.5 01/25/2021   CALCIUM 9.4 09/30/2021   GFRAA 65 02/12/2021   QFTBGOLDPLUS NEGATIVE 09/30/2021    Speciality Comments: No specialty comments available.  Procedures:  No procedures performed Allergies: Influenza vaccines, Penicillins, Statins, and Sulfamethoxazole-trimethoprim   Assessment / Plan:     Visit Diagnoses: No diagnosis found.  Orders: No orders of the defined types were placed in this encounter.  No orders of the defined types were placed in this encounter.   Face-to-face time spent with patient was *** minutes. Greater than 50% of time was spent in counseling and coordination of care.  Follow-Up Instructions: No follow-ups on file.   Earnestine Mealing, CMA  Note - This record has been created using Editor, commissioning.  Chart creation errors have been sought, but may not always  have been located. Such creation errors do not reflect on  the standard of medical care.

## 2022-01-06 ENCOUNTER — Ambulatory Visit: Payer: Medicare Other | Admitting: Physician Assistant

## 2022-01-06 ENCOUNTER — Ambulatory Visit: Payer: Medicare Other | Admitting: Neurology

## 2022-01-06 DIAGNOSIS — M5136 Other intervertebral disc degeneration, lumbar region: Secondary | ICD-10-CM

## 2022-01-06 DIAGNOSIS — Z79899 Other long term (current) drug therapy: Secondary | ICD-10-CM

## 2022-01-06 DIAGNOSIS — M170 Bilateral primary osteoarthritis of knee: Secondary | ICD-10-CM

## 2022-01-06 DIAGNOSIS — Z8679 Personal history of other diseases of the circulatory system: Secondary | ICD-10-CM

## 2022-01-06 DIAGNOSIS — Z8669 Personal history of other diseases of the nervous system and sense organs: Secondary | ICD-10-CM

## 2022-01-06 DIAGNOSIS — Z8719 Personal history of other diseases of the digestive system: Secondary | ICD-10-CM

## 2022-01-06 DIAGNOSIS — B029 Zoster without complications: Secondary | ICD-10-CM

## 2022-01-06 DIAGNOSIS — Z8639 Personal history of other endocrine, nutritional and metabolic disease: Secondary | ICD-10-CM

## 2022-01-06 DIAGNOSIS — G4709 Other insomnia: Secondary | ICD-10-CM

## 2022-01-06 DIAGNOSIS — Z8709 Personal history of other diseases of the respiratory system: Secondary | ICD-10-CM

## 2022-01-06 DIAGNOSIS — Z8659 Personal history of other mental and behavioral disorders: Secondary | ICD-10-CM

## 2022-01-06 DIAGNOSIS — M3501 Sicca syndrome with keratoconjunctivitis: Secondary | ICD-10-CM

## 2022-01-06 DIAGNOSIS — M0579 Rheumatoid arthritis with rheumatoid factor of multiple sites without organ or systems involvement: Secondary | ICD-10-CM

## 2022-01-06 DIAGNOSIS — M797 Fibromyalgia: Secondary | ICD-10-CM

## 2022-01-06 DIAGNOSIS — M810 Age-related osteoporosis without current pathological fracture: Secondary | ICD-10-CM

## 2022-01-11 ENCOUNTER — Encounter: Payer: Self-pay | Admitting: Registered Nurse

## 2022-01-11 ENCOUNTER — Encounter: Payer: Medicare Other | Attending: Physical Medicine and Rehabilitation | Admitting: Registered Nurse

## 2022-01-11 VITALS — BP 114/59 | HR 61 | Ht 66.0 in

## 2022-01-11 DIAGNOSIS — Z79891 Long term (current) use of opiate analgesic: Secondary | ICD-10-CM | POA: Insufficient documentation

## 2022-01-11 DIAGNOSIS — G894 Chronic pain syndrome: Secondary | ICD-10-CM | POA: Diagnosis not present

## 2022-01-11 DIAGNOSIS — G8929 Other chronic pain: Secondary | ICD-10-CM | POA: Diagnosis present

## 2022-01-11 DIAGNOSIS — M7062 Trochanteric bursitis, left hip: Secondary | ICD-10-CM | POA: Insufficient documentation

## 2022-01-11 DIAGNOSIS — M545 Low back pain, unspecified: Secondary | ICD-10-CM | POA: Insufficient documentation

## 2022-01-11 DIAGNOSIS — M7061 Trochanteric bursitis, right hip: Secondary | ICD-10-CM | POA: Diagnosis present

## 2022-01-11 DIAGNOSIS — Z5181 Encounter for therapeutic drug level monitoring: Secondary | ICD-10-CM | POA: Diagnosis not present

## 2022-01-11 DIAGNOSIS — M961 Postlaminectomy syndrome, not elsewhere classified: Secondary | ICD-10-CM | POA: Insufficient documentation

## 2022-01-11 DIAGNOSIS — M25511 Pain in right shoulder: Secondary | ICD-10-CM | POA: Diagnosis not present

## 2022-01-11 DIAGNOSIS — M170 Bilateral primary osteoarthritis of knee: Secondary | ICD-10-CM | POA: Insufficient documentation

## 2022-01-11 MED ORDER — OXYCODONE-ACETAMINOPHEN 10-325 MG PO TABS: 1.0000 | ORAL_TABLET | Freq: Four times a day (QID) | ORAL | 0 refills | Status: DC | PRN

## 2022-01-11 NOTE — Progress Notes (Signed)
Subjective:    Patient ID: Katherine Delgado, female    DOB: [birth year], 81 y.o.   MRN: [id number]  HPI: Katherine Delgado is a 81 y.o. female who returns for follow up appointment for chronic pain and medication refill. She states her pain is located in her right shoulder, right elbow, lower back, bilateral hips and bilateral lower extremities. She also reports bilateral knee pain.She  rates her pain 8. Her current exercise regime is walking with walker.  Katherine Delgado Morphine equivalent is 60.00 MME.   Last UDS was Performed on 12/14/2021. It was consistent.     Pain Inventory Average Pain 8 Pain Right Now 8 My pain is sharp, burning, dull, stabbing, and aching  In the last 24 hours, has pain interfered with the following? General activity 9 Relation with others 10 Enjoyment of life 9 What TIME of day is your pain at its worst? morning  and night Sleep (in general) Poor  Pain is worse with: walking, bending, sitting, standing, and some activites Pain improves with: rest, therapy/exercise, and medication Relief from Meds: 5  Family History  Problem Relation Age of Onset   Alzheimer's disease Mother    Heart disease Mother    Heart disease Father    Liver disease Father    Cancer Brother    Arthritis Son    Colon polyps Brother    Colon cancer Neg Hx    Esophageal cancer Neg Hx    Kidney disease Neg Hx    Stomach cancer Neg Hx    Rectal cancer Neg Hx    Social History   Socioeconomic History   Marital status: Widowed    Spouse name: Not on file   Number of children: 2   Years of education: 14   Highest education level: Not on file  Occupational History   Occupation: Retired  Tobacco Use   Smoking status: Former    Packs/day: 0.10    Years: 10.00    Pack years: 1.00    Types: Cigarettes    Quit date: 12/07/1979    Years since quitting: 42.1    Passive exposure: Current   Smokeless tobacco: Never  Vaping Use   Vaping Use: Never used  Substance and Sexual Activity    Alcohol use: No    Alcohol/week: 0.0 standard drinks   Drug use: No   Sexual activity: Not Currently  Other Topics Concern   Not on file  Social History Narrative   Walks with cane   Right handed    Caffeine use: Coffee (2 cups every morning)   Tea: sometimes   Soda: none   Social Determinants of Health   Financial Resource Strain: Not on file  Food Insecurity: Not on file  Transportation Needs: Not on file  Physical Activity: Not on file  Stress: Not on file  Social Connections: Not on file   Past Surgical History:  Procedure Laterality Date   ABDOMINAL HYSTERECTOMY  1974   abdominal tumor  2002   APPENDECTOMY     APPLICATION OF A-CELL OF BACK N/A 09/26/2018   Procedure: With Acell;  Surgeon: Wallace Going, DO;  Location: [place];  Service: Plastics;  Laterality: N/A;   APPLICATION OF WOUND VAC N/A 09/26/2018   Procedure: Vac placement;  Surgeon: Wallace Going, DO;  Location: [facility];  Service: Plastics;  Laterality: N/A;   BACK SURGERY  1982   BRONCHOSCOPY  2001   CATARACT EXTRACTION, BILATERAL  09/2019, 10/2019  CHOLECYSTECTOMY  1984   INCISION AND DRAINAGE OF WOUND N/A 09/26/2018   Procedure: Debridement of spine wound;  Surgeon: Wallace Going, DO;  Location: [facility];  Service: Plastics;  Laterality: N/A;   KNEE SURGERY Bilateral 08/27/2010 (L) and 01/11/2011 (R)   LUMBAR LAMINECTOMY/DECOMPRESSION MICRODISCECTOMY N/A 07/03/2018   Procedure: Lumbar Three to Lumbar Five Laminectomy;  Surgeon: Judith Part, MD;  Location: [facility];  Service: Neurosurgery;  Laterality: N/A;   LUMBAR WOUND DEBRIDEMENT N/A 08/20/2018   Procedure: POSTERIOR LUMBAR SPINAL WOUND DEBRIDEMENT AND REVISION;  Surgeon: Judith Part, MD;  Location: [facility];  Service: Neurosurgery;  Laterality: N/A;  POSTERIOR LUMBAR SPINAL WOUND REVISION   miscarrage  1962   OVARIAN CYST SURGERY  1968   thymus tumor     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   TONSILLECTOMY     Past Surgical History:   Procedure Laterality Date   ABDOMINAL HYSTERECTOMY  1974   abdominal tumor  2002   APPENDECTOMY     APPLICATION OF A-CELL OF BACK N/A 09/26/2018   Procedure: With Acell;  Surgeon: Wallace Going, DO;  Location: [place];  Service: Plastics;  Laterality: N/A;   APPLICATION OF WOUND VAC N/A 09/26/2018   Procedure: Vac placement;  Surgeon: Wallace Going, DO;  Location: [place];  Service: Plastics;  Laterality: N/A;   BACK SURGERY  1982   BRONCHOSCOPY  2001   CATARACT EXTRACTION, BILATERAL  09/2019, 10/2019   CHOLECYSTECTOMY  1984   INCISION AND DRAINAGE OF WOUND N/A 09/26/2018   Procedure: Debridement of spine wound;  Surgeon: Wallace Going, DO;  Location: [place];  Service: Plastics;  Laterality: N/A;   KNEE SURGERY Bilateral 08/27/2010 (L) and 01/11/2011 (R)   LUMBAR LAMINECTOMY/DECOMPRESSION MICRODISCECTOMY N/A 07/03/2018   Procedure: Lumbar Three to Lumbar Five Laminectomy;  Surgeon: Judith Part, MD;  Location: [facility];  Service: Neurosurgery;  Laterality: N/A;   LUMBAR WOUND DEBRIDEMENT N/A 08/20/2018   Procedure: POSTERIOR LUMBAR SPINAL WOUND DEBRIDEMENT AND REVISION;  Surgeon: Judith Part, MD;  Location: [facility];  Service: Neurosurgery;  Laterality: N/A;  POSTERIOR LUMBAR SPINAL WOUND REVISION   miscarrage  1962   OVARIAN CYST SURGERY  1968   thymus tumor     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   TONSILLECTOMY     Past Medical History:  Diagnosis Date   Abnormality of gait 04/19/2016   Acute infective polyneuritis ([place]) 2002   Allergic rhinitis due to pollen    Chronic pain syndrome    COPD (chronic obstructive pulmonary disease) (HCC)    chronic bronchitis   Depressive disorder, not elsewhere classified    Diabetes mellitus without complication ([place])    Diaphragmatic hernia without mention of obstruction or gangrene    Dyslipidemia    Fibromyalgia    GERD (gastroesophageal reflux disease)    with h/o esophagitis   Guillain-Barre syndrome (HCC)    History of benign  thymus tumor    Insomnia, unspecified    Lumbar spinal stenosis 03/30/2018   L4-5 level, severe   Miscarriage 1962   Mixed hyperlipidemia    Morbid obesity ([place])    Osteoporosis    Pneumonia    Polyneuropathy in diabetes(357.2)    Rheumatoid arthritis(714.0)    Spondylosis, lumbosacral    Spontaneous ecchymoses    Type II or unspecified type diabetes mellitus with peripheral circulatory disorders, uncontrolled(250.72)    Unspecified chronic bronchitis (HCC)    Unspecified essential hypertension    Unspecified hypothyroidism  Unspecified pruritic disorder    Unspecified urinary incontinence    BP (!) 114/59   Pulse 61   Ht '5\' 6"'$  (1.676 m)   SpO2 94%   BMI 35.64 kg/m   Opioid Risk Score:   Fall Risk Score:  `1  Depression screen [facility] 2/9     01/11/2022    9:09 AM 12/14/2021    9:15 AM 08/02/2021   10:15 AM 06/29/2021   10:03 AM 06/01/2021   10:00 AM 04/27/2021   10:47 AM 03/11/2021   11:01 AM  Depression screen PHQ 2/9  Decreased Interest 0 0 1 0 1 0 0  Down, Depressed, Hopeless 0 0 1 0 1 0 0  PHQ - 2 Score 0 0 2 0 2 0 0     Review of Systems  Constitutional: Negative.   HENT: Negative.    Eyes: Negative.   Respiratory: Negative.    Cardiovascular: Negative.   Gastrointestinal: Negative.   Endocrine: Negative.   Genitourinary: Negative.   Musculoskeletal:  Positive for back pain and gait problem.  Skin: Negative.   Allergic/Immunologic: Negative.   Hematological: Negative.   Psychiatric/Behavioral:  Positive for sleep disturbance.       Objective:   Physical Exam Vitals and nursing note reviewed.  Constitutional:      Appearance: Normal appearance.  Cardiovascular:     Rate and Rhythm: Normal rate and regular rhythm.     Pulses: Normal pulses.     Heart sounds: Normal heart sounds.  Pulmonary:     Effort: Pulmonary effort is normal.     Breath sounds: Normal breath sounds.  Musculoskeletal:     Cervical back: Normal range of motion and neck supple.      Right lower leg: Edema present.     Left lower leg: Edema present.     Comments: Normal Muscle Bulk and Muscle Testing Reveals:  Upper Extremities:Full  ROM and Muscle Strength 5/5 Right AC Joint Tenderness  Lumbar Paraspinal Tenderness: L-4-L-5 Bilateral Greater Trochanter Tenderness Lower Extremities: Full ROM and Muscle Strength 5/5 Arrived in wheelchair     Skin:    General: Skin is warm and dry.  Neurological:     Mental Status: She is alert and oriented to person, place, and time.  Psychiatric:        Mood and Affect: Mood normal.        Behavior: Behavior normal.         Assessment & Plan:  Failed Back Syndrome: Continue HEP as Tolerated. Continue to Monitor. She is receiving Home Health therapy at this time, PT/OT two days a week. 01/11/2021 Lumbar Radiculitis: Continue Gabapentin. Continue to monitor. Continue HEP as Tolerated.01/11/2022 Bilateral  Greater Trochanter Bursitis: .Continue to Alternate Ice and Heat Therapy. Continue current medication regimen. Continue to monitor. 01/11/2022 Bilateral Primary OA: Continue HEP as Tolerated. Continue to Monitor. 01/11/2022 Insomnia: Continue Amitriptyline . Continue to Monitor.01/11/2022 Chronic Pain Syndrome: Refilled: Oxycodone 10/325 mg one tablet every 6 hours as needed for pain #120. We will continue the opioid monitoring program, this consists of regular clinic visits, examinations, urine drug screen, pill counts as well as use of [place] Controlled Substance Reporting system. A 12 month History has been reviewed on the [place] on 01/11/2022   F/U in 1 month

## 2022-01-14 DIAGNOSIS — E1142 Type 2 diabetes mellitus with diabetic polyneuropathy: Secondary | ICD-10-CM | POA: Diagnosis not present

## 2022-01-14 DIAGNOSIS — Z794 Long term (current) use of insulin: Secondary | ICD-10-CM | POA: Diagnosis not present

## 2022-01-18 ENCOUNTER — Other Ambulatory Visit: Payer: Self-pay | Admitting: Adult Health

## 2022-01-18 DIAGNOSIS — E1151 Type 2 diabetes mellitus with diabetic peripheral angiopathy without gangrene: Secondary | ICD-10-CM

## 2022-02-01 ENCOUNTER — Encounter: Payer: Self-pay | Admitting: Family

## 2022-02-01 ENCOUNTER — Ambulatory Visit (INDEPENDENT_AMBULATORY_CARE_PROVIDER_SITE_OTHER): Payer: Medicare Other | Admitting: Family

## 2022-02-01 VITALS — BP 140/80 | HR 74 | Temp 97.6°F | Resp 18 | Ht 66.0 in | Wt 235.6 lb

## 2022-02-01 DIAGNOSIS — R350 Frequency of micturition: Secondary | ICD-10-CM | POA: Diagnosis not present

## 2022-02-01 DIAGNOSIS — R300 Dysuria: Secondary | ICD-10-CM

## 2022-02-01 DIAGNOSIS — E876 Hypokalemia: Secondary | ICD-10-CM

## 2022-02-01 DIAGNOSIS — B372 Candidiasis of skin and nail: Secondary | ICD-10-CM | POA: Diagnosis not present

## 2022-02-01 DIAGNOSIS — R600 Localized edema: Secondary | ICD-10-CM | POA: Diagnosis not present

## 2022-02-01 LAB — POCT URINALYSIS DIPSTICK
Bilirubin, UA: NEGATIVE
Glucose, UA: NEGATIVE
Ketones, UA: POSITIVE
Nitrite, UA: POSITIVE
Protein, UA: NEGATIVE
Spec Grav, UA: 1.02 (ref 1.010–1.025)
Urobilinogen, UA: NEGATIVE E.U./dL — AB
pH, UA: 6.5 (ref 5.0–8.0)

## 2022-02-01 MED ORDER — POTASSIUM CHLORIDE ER 10 MEQ PO CPCR: 20.0000 meq | ORAL_CAPSULE | Freq: Two times a day (BID) | ORAL | 0 refills | Status: DC | PRN

## 2022-02-01 MED ORDER — FOLIC ACID 1 MG PO TABS: 2.0000 mg | ORAL_TABLET | Freq: Every day | ORAL | 1 refills | Status: AC

## 2022-02-01 MED ORDER — FUROSEMIDE 20 MG PO TABS: ORAL_TABLET | ORAL | 0 refills | Status: DC

## 2022-02-01 MED ORDER — FLUCONAZOLE 150 MG PO TABS: 150.0000 mg | ORAL_TABLET | Freq: Every day | ORAL | 0 refills | Status: AC

## 2022-02-01 NOTE — Patient Instructions (Signed)
Intertrigo Intertrigo is skin irritation or inflammation (dermatitis) that occurs when folds of skin rub together. The irritation can cause a rash and make skin raw and itchy. This condition most commonly occurs in the skin folds of these areas: Toes. Armpits. Groin. Under the belly. Under the breasts. Buttocks. Intertrigo is not passed from person to person (is not contagious). What are the causes? This condition is caused by heat, moisture, rubbing (friction), and not enough air circulation. The condition can be made worse by: Sweat. Bacteria. A fungus, such as yeast. What increases the risk? This condition is more likely to occur if you have moisture in your skin folds. You are more likely to develop this condition if you: Have diabetes. Are overweight. Are not able to move around or are not active. Live in a warm and moist climate. Wear splints, braces, or other medical devices. Are not able to control your bowels or bladder (have incontinence). What are the signs or symptoms? Symptoms of this condition include: A pink or red skin rash in the skin fold or near the skin fold. Raw or scaly skin. Itchiness. A burning feeling. Bleeding. Leaking fluid. A bad smell. How is this diagnosed? This condition is diagnosed with a medical history and physical exam. You may also have a skin swab to test for bacteria or a fungus. How is this treated? This condition may be treated by: Cleaning and drying your skin. Taking an antibiotic medicine or using an antibiotic skin cream for a bacterial infection. Using an antifungal cream on your skin or taking pills for an infection that was caused by a fungus, such as yeast. Using a steroid ointment to relieve itchiness and irritation. Separating the skin fold with a clean cotton cloth to absorb moisture and allow air to flow into the area. Follow these instructions at home: Keep the affected area clean and dry. Do not scratch your skin. Stay  in a cool environment as much as possible. Use an air conditioner or fan, if available. Apply over-the-counter and prescription medicines only as told by your health care provider. If you were prescribed an antibiotic medicine, use it as told by your health care provider. Do not stop using the antibiotic even if your condition improves. Keep all follow-up visits as told by your health care provider. This is important. How is this prevented?  Maintain a healthy weight. Take care of your feet, especially if you have diabetes. Foot care includes: Wearing shoes that fit well. Keeping your feet dry. Wearing clean, breathable socks. Protect the skin around your groin and buttocks, especially if you have incontinence. Skin protection includes: Following a regular cleaning routine. Using skin protectant creams, powders, or ointments. Changing protection pads frequently. Do not wear tight clothes. Wear clothes that are loose, absorbent, and made of cotton. Wear a bra that gives good support, if needed. Shower and dry yourself well after activity or exercise. Use a hair dryer on a cool setting to dry between skin folds, especially after you bathe. If you have diabetes, keep your blood sugar under control. Contact a health care provider if: Your symptoms do not improve with treatment. Your symptoms get worse or they spread. You notice increased redness and warmth. You have a fever. Summary Intertrigo is skin irritation or inflammation (dermatitis) that occurs when folds of skin rub together. This condition is caused by heat, moisture, rubbing (friction), and not enough air circulation. This condition may be treated by cleaning and drying your skin and with medicines.  Apply over-the-counter and prescription medicines only as told by your health care provider. Keep all follow-up visits as told by your health care provider. This is important. This information is not intended to replace advice given  to you by your health care provider. Make sure you discuss any questions you have with your health care provider. Document Revised: 05/24/2021 Document Reviewed: 05/24/2021 Elsevier Patient Education  [place].

## 2022-02-01 NOTE — Progress Notes (Signed)
Provider: Marlowe Sax FNP-C  Nolan Tuazon, Nelda Bucks, NP  Patient Care Team: Auda Finfrock, Nelda Bucks, NP as PCP - General (Family Medicine) Bo Merino, MD (Rheumatology) Altheimer, Legrand Como, MD as Attending Physician (Endocrinology) Newt Minion, MD as Consulting Physician (Orthopedic Surgery)  Extended Emergency Contact Information Primary Emergency Contact: [facility] Address: [address]          [place], [place] [address] Johnnette Litter of [place] Phone: [phone] Relation: Son Secondary Emergency Contact: Marlana Latus, [place] Mobile Phone: [phone] Relation: Son  Code Status:  Full Code  Goals of care: Advanced Directive information    02/01/2022    1:50 PM  Advanced Directives  Does Patient Have a Medical Advance Directive? Yes  Type of Advance Directive Out of facility DNR (pink MOST or yellow form)  Does patient want to make changes to medical advance directive? No - Patient declined     Chief Complaint  Patient presents with   Acute Visit    Patient complains of possible urinary tract infection and yeast infection in groin area and under breast. Symptoms of UTI are dysuria, urinary frequency, and back pain.    HPI:  Pt is a 81 y.o. female seen today for an acute visit for evaluation of dysuria,urine frequency,lower back pain and skin fold redness under the breast and abdomen. She denies any fever,chills,nausea,vomiting,abdominal pain,flank pain,urgency,difficult urination or hematuria.    Past Medical History:  Diagnosis Date   Abnormality of gait 04/19/2016   Acute infective polyneuritis ([facility]) 2002   Allergic rhinitis due to pollen    Chronic pain syndrome    COPD (chronic obstructive pulmonary disease) (HCC)    chronic bronchitis   Depressive disorder, not elsewhere classified    Diabetes mellitus without complication ([place])    Diaphragmatic hernia without mention of obstruction or gangrene     Dyslipidemia    Fibromyalgia    GERD (gastroesophageal reflux disease)    with h/o esophagitis   Guillain-Barre syndrome (HCC)    History of benign thymus tumor    Insomnia, unspecified    Lumbar spinal stenosis 03/30/2018   L4-5 level, severe   Miscarriage 1962   Mixed hyperlipidemia    Morbid obesity ([facility])    Osteoporosis    Pneumonia    Polyneuropathy in diabetes(357.2)    Rheumatoid arthritis(714.0)    Spondylosis, lumbosacral    Spontaneous ecchymoses    Type II or unspecified type diabetes mellitus with peripheral circulatory disorders, uncontrolled(250.72)    Unspecified chronic bronchitis (HCC)    Unspecified essential hypertension    Unspecified hypothyroidism    Unspecified pruritic disorder    Unspecified urinary incontinence    Past Surgical History:  Procedure Laterality Date   ABDOMINAL HYSTERECTOMY  1974   abdominal tumor  2002   APPENDECTOMY     APPLICATION OF A-CELL OF BACK N/A 09/26/2018   Procedure: With Acell;  Surgeon: Wallace Going, DO;  Location: [facility];  Service: Plastics;  Laterality: N/A;   APPLICATION OF WOUND VAC N/A 09/26/2018   Procedure: Vac placement;  Surgeon: Wallace Going, DO;  Location: [facility];  Service: Plastics;  Laterality: N/A;   BACK SURGERY  1982   BRONCHOSCOPY  2001   CATARACT EXTRACTION, BILATERAL  09/2019, 10/2019   CHOLECYSTECTOMY  1984   INCISION AND DRAINAGE OF WOUND N/A 09/26/2018   Procedure: Debridement of spine wound;  Surgeon: Wallace Going, DO;  Location: [facility]  OR;  Service: Clinical cytogeneticist;  Laterality: N/A;   KNEE SURGERY Bilateral 08/27/2010 (L) and 01/11/2011 (R)   LUMBAR LAMINECTOMY/DECOMPRESSION MICRODISCECTOMY N/A 07/03/2018   Procedure: Lumbar Three to Lumbar Five Laminectomy;  Surgeon: Judith Part, MD;  Location: [facility];  Service: Neurosurgery;  Laterality: N/A;   LUMBAR WOUND DEBRIDEMENT N/A 08/20/2018   Procedure: POSTERIOR LUMBAR SPINAL WOUND DEBRIDEMENT AND REVISION;  Surgeon: Judith Part,  MD;  Location: [facility];  Service: Neurosurgery;  Laterality: N/A;  POSTERIOR LUMBAR SPINAL WOUND REVISION   miscarrage  1962   OVARIAN CYST SURGERY  1968   thymus tumor     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   TONSILLECTOMY      Allergies  Allergen Reactions   Influenza Vaccines Other (See Comments)    "h/o Guillain Barre; dr's told me years ago never to take another flu shot as it could relapse Rosalee Kaufman; has to do with when vaccine being changed to H1N1 virus"   Penicillins Hives, Itching, Swelling and Rash    Has patient had a PCN reaction causing immediate rash, facial/tongue/throat swelling, SOB or lightheadedness with hypotension:Unknown Has patient had a PCN reaction causing severe rash involving mucus membranes or skin necrosis: Unknown Has patient had a PCN reaction that required hospitalization:Unknown Has patient had a PCN reaction occurring within the last 10 years: Unknown If all of the above answers are "NO", then may proceed with Cephalosporin use.    Statins Other (See Comments)    Myopathy, transaminitis   Sulfamethoxazole-Trimethoprim Itching    Outpatient Encounter Medications as of 02/01/2022  Medication Sig   acetaminophen (TYLENOL) 325 MG tablet Take 650 mg by mouth every 6 (six) hours as needed (for pain.).   amitriptyline (ELAVIL) 50 MG tablet Take 1 tablet (50 mg total) by mouth at bedtime.   amLODipine (NORVASC) 10 MG tablet TAKE 1 TABLET(10 MG) BY MOUTH DAILY   aspirin EC 81 MG tablet Take 81 mg by mouth 2 (two) times a week.   augmented betamethasone dipropionate (DIPROLENE-AF) 0.05 % cream Apply topically 2 (two) times daily as needed.   calamine lotion Apply 1 application topically 3 (three) times daily. To affected areas on right fore arm   cholecalciferol (VITAMIN D3) 25 MCG (1000 UT) tablet Take 1,000 Units by mouth daily.   Cranberry 475 MG CAPS Take 1 capsule (475 mg total) by mouth 2 (two) times daily.   docusate sodium (COLACE) 100 MG capsule Take 100 mg by  mouth daily as needed for mild constipation.   DULoxetine (CYMBALTA) 20 MG capsule TAKE 1 CAPSULE(20 MG) BY MOUTH DAILY   ezetimibe (ZETIA) 10 MG tablet Take 1 tablet (10 mg total) by mouth daily.   fluconazole (DIFLUCAN) 150 MG tablet Take 1 tablet (150 mg total) by mouth daily for 5 days.   fluticasone (CUTIVATE) 0.05 % cream Apply topically 2 (two) times daily as needed.   gabapentin (NEURONTIN) 600 MG tablet TAKE 1 TABLET(600 MG) BY MOUTH THREE TIMES DAILY   insulin glargine, 1 Unit Dial, (TOUJEO SOLOSTAR) 300 UNIT/ML Solostar Pen Inject 90 Units into the skin daily in the afternoon.   insulin lispro (HUMALOG KWIKPEN) 200 UNIT/ML KwikPen Max daily 80 units   Insulin Pen Needle (B-D UF III MINI PEN NEEDLES) 31G X 5 MM MISC USE AS DIRECTED   ipratropium-albuterol (DUONEB) 0.5-2.5 (3) MG/3ML SOLN Take 3 mLs by nebulization every 6 (six) hours as needed.   ketoconazole (NIZORAL) 2 % cream Apply 1 application topically 2 (two) times daily.  levothyroxine (SYNTHROID) 112 MCG tablet Take 1 tablet (112 mcg total) by mouth daily.   lidocaine (LIDODERM) 5 % PLACE 1 PATCH ONTO THE SKIN DAILY AS DIRECTED, REMOVE AND DISCARD PATCH WITHN 12 HOURS OR AS DIRECTED BY MD   liraglutide (VICTOZA) 18 MG/3ML SOPN Administer 1.'8mg'$  under the skin daily for blood sugar.   lisinopril (ZESTRIL) 20 MG tablet TAKE 1 TABLET BY MOUTH EVERY DAY   loperamide (IMODIUM A-D) 2 MG tablet Take 2 mg by mouth 4 (four) times daily as needed for diarrhea or loose stools.   Melatonin 10 MG TABS Take 10 mg by mouth at bedtime as needed (sleep).   Menthol, Topical Analgesic, (ICY HOT MEDICATED SPRAY EX) Apply 1 application topically as needed (shoulder pain).   metoprolol tartrate (LOPRESSOR) 50 MG tablet TAKE 1 TABLET BY MOUTH TWICE DAILY   mirabegron ER (MYRBETRIQ) 50 MG TB24 tablet Take 1 tablet (50 mg total) by mouth daily.   Multiple Vitamins-Minerals (MULTIVITAMIN ADULT PO) Take 1 tablet by mouth daily.   nystatin  (MYCOSTATIN/NYSTOP) powder Apply 1 application topically 3 (three) times daily.   nystatin (NYSTATIN) powder APPLY TOPICALLY THREE TIMES DAILY AS DIRECTED FOR 14 DAYS and prn yeast infection   nystatin cream (MYCOSTATIN) Apply 1 application topically 3 (three) times daily. To skin folds   omega-3 acid ethyl esters (LOVAZA) 1 g capsule TAKE ONE CAPSULE BY MOUTH EVERY DAY   oxyCODONE-acetaminophen (PERCOCET) 10-325 MG tablet Take 1 tablet by mouth every 6 (six) hours as needed for pain.   polyvinyl alcohol (LIQUIFILM TEARS) 1.4 % ophthalmic solution Place 1 drop into both eyes 4 (four) times daily as needed (for dry/irritated eyes).    saccharomyces boulardii (FLORASTOR) 250 MG capsule Take 1 capsule (250 mg total) by mouth 2 (two) times daily. For 10 days.   sodium chloride (OCEAN) 0.65 % nasal spray Place 2 sprays into the nose as needed for congestion.   tiotropium (SPIRIVA) 18 MCG inhalation capsule Place 18 mcg into inhaler and inhale as needed.   tiZANidine (ZANAFLEX) 2 MG tablet TAKE 1 TABLET(2 MG) BY MOUTH THREE TIMES DAILY   triamterene-hydrochlorothiazide (MAXZIDE-25) 37.5-25 MG tablet TAKE 1 TABLET BY MOUTH DAILY   venlafaxine XR (EFFEXOR-XR) 75 MG 24 hr capsule TAKE 1 CAPSULE BY MOUTH EVERY DAY WITH BREAKFAST   XELJANZ 5 MG TABS TAKE 1 TABLET BY MOUTH DAILY   [DISCONTINUED] folic acid (FOLVITE) 1 MG tablet TAKE 2 TABLETS BY MOUTH  DAILY   [DISCONTINUED] furosemide (LASIX) 20 MG tablet TAKE 1 TABLET BY MOUTH TWICE DAILY AS NEEDED FOR 3 POUND WEIGHT GAIN IN ONE DAY OR 5 POUNDS IN ONE WEEK   [DISCONTINUED] potassium chloride (MICRO-K) 10 MEQ CR capsule Take 2 capsules (20 mEq total) by mouth 2 (two) times daily as needed (when taking a dose of lasix.).   folic acid (FOLVITE) 1 MG tablet Take 2 tablets (2 mg total) by mouth daily.   furosemide (LASIX) 20 MG tablet TAKE 1 TABLET BY MOUTH TWICE DAILY AS NEEDED FOR 3 POUND WEIGHT GAIN IN ONE DAY OR 5 POUNDS IN ONE WEEK.Take 2 tablet daily for 3  days then resume as needed.   potassium chloride (MICRO-K) 10 MEQ CR capsule Take 2 capsules (20 mEq total) by mouth 2 (two) times daily as needed (when taking a dose of lasix.).   [DISCONTINUED] ofloxacin (OCUFLOX) 0.3 % ophthalmic solution SMARTSIG:Left Ear   No facility-administered encounter medications on file as of 02/01/2022.    Review of Systems  Constitutional:  Negative for appetite change, chills, fatigue, fever and unexpected weight change.  Eyes:  Negative for pain, discharge, redness, itching and visual disturbance.  Respiratory:  Negative for cough, chest tightness, shortness of breath and wheezing.   Cardiovascular:  Negative for chest pain, palpitations and leg swelling.  Gastrointestinal:  Negative for abdominal distention, abdominal pain, constipation, nausea and vomiting.  Genitourinary:  Positive for dysuria and frequency. Negative for difficulty urinating, flank pain and urgency.  Musculoskeletal:  Positive for back pain and gait problem. Negative for arthralgias, joint swelling, myalgias, neck pain and neck stiffness.  Skin:  Negative for pallor, rash and wound.       Skin fold redness   Neurological:  Negative for dizziness, speech difficulty, light-headedness, numbness and headaches.  Psychiatric/Behavioral:  Negative for agitation, behavioral problems, confusion, hallucinations and sleep disturbance. The patient is not nervous/anxious.     Immunization History  Administered Date(s) Administered   Influenza-Unspecified 06/10/2011   PFIZER(Purple Top)SARS-COV-2 Vaccination 11/13/2019, 11/30/2019   Pneumococcal Conjugate-13 07/07/2016   Pneumococcal Polysaccharide-23 11/21/2003, 10/05/2017   Tdap 05/28/2016   Pertinent  Health Maintenance Due  Topic Date Due   FOOT EXAM  10/15/2021   HEMOGLOBIN A1C  03/02/2022   OPHTHALMOLOGY EXAM  03/25/2022   DEXA SCAN  09/20/2025   INFLUENZA VACCINE  Discontinued      08/02/2021   10:14 AM 09/21/2021   10:00 AM  12/14/2021    9:15 AM 01/11/2022    9:09 AM 02/01/2022    1:50 PM  Fall Risk  Falls in the past year? 0 0 0 0 0  Was there an injury with Fall? 0 0   0  Fall Risk Category Calculator 0 0   0  Fall Risk Category Low Low   Low  Patient Fall Risk Level     Low fall risk  Patient at Risk for Falls Due to     No Fall Risks  Fall risk Follow up     Falls evaluation completed   Functional Status Survey:    Vitals:   02/01/22 1343  BP: 140/80  Pulse: 74  Resp: 18  Temp: 97.6 F (36.4 C)  SpO2: 95%  Weight: 235 lb 9.6 oz (106.9 kg)  Height: '5\' 6"'$  (1.676 m)   Body mass index is 38.03 kg/m. Physical Exam Vitals reviewed.  Constitutional:      General: She is not in acute distress.    Appearance: Normal appearance. She is normal weight. She is not ill-appearing or diaphoretic.  HENT:     Mouth/Throat:     Mouth: Mucous membranes are moist.     Pharynx: Oropharynx is clear. No oropharyngeal exudate or posterior oropharyngeal erythema.  Eyes:     General: No scleral icterus.       Right eye: No discharge.        Left eye: No discharge.     Conjunctiva/sclera: Conjunctivae normal.     Pupils: Pupils are equal, round, and reactive to light.  Cardiovascular:     Rate and Rhythm: Normal rate and regular rhythm.     Pulses: Normal pulses.     Heart sounds: Normal heart sounds. No murmur heard.    No friction rub. No gallop.  Pulmonary:     Effort: Pulmonary effort is normal. No respiratory distress.     Breath sounds: Normal breath sounds. No wheezing, rhonchi or rales.  Chest:     Chest wall: No tenderness.  Abdominal:     General: Bowel sounds are  normal. There is no distension.     Palpations: Abdomen is soft. There is no mass.     Tenderness: There is no abdominal tenderness. There is no right CVA tenderness, left CVA tenderness, guarding or rebound.  Musculoskeletal:        General: No swelling or tenderness. Normal range of motion.     Right lower leg: Edema present.      Left lower leg: Edema present.     Comments: On wheelchair during visit   Skin:    General: Skin is warm and dry.     Coloration: Skin is not pale.     Findings: No bruising, lesion or rash.     Comments: Extensive beefy erythema with excoriation on bilateral breast and abdominal folds and inner thigh areas.    Neurological:     Mental Status: She is alert and oriented to person, place, and time.     Motor: No weakness.     Gait: Gait abnormal.  Psychiatric:        Mood and Affect: Mood normal.        Speech: Speech normal.        Behavior: Behavior normal.    Labs reviewed: Recent Labs    03/31/21 0836 09/02/21 1039 09/30/21 1532  NA 136 140 138  K 3.5 4.1 4.0  CL 100 103 101  CO2 '24 28 28  '$ GLUCOSE 87 57* 105*  BUN 15 18 27*  CREATININE 1.13* 1.02 1.06*  CALCIUM 9.5 9.1 9.4   Recent Labs    02/12/21 0957 09/30/21 1532  AST 16 12  ALT 12 6  BILITOT 0.4 0.3  PROT 5.9* 7.0   Recent Labs    02/12/21 0957 03/31/21 0836 09/30/21 1532  WBC 16.2* 10.5 14.7*  NEUTROABS 13,268* 8.8* 10,790*  HGB 11.4* 14.2 13.6  HCT 36.3 44.3 42.4  MCV 85.4 84.5 82.3  PLT 412* 258 363   Lab Results  Component Value Date   TSH 0.44 09/02/2021   Lab Results  Component Value Date   HGBA1C 5.9 (A) 09/02/2021   Lab Results  Component Value Date   CHOL 127 02/12/2021   HDL 17 (L) 02/12/2021   LDLCALC 76 02/12/2021   TRIG 259 (H) 02/12/2021   CHOLHDL 7.5 (H) 02/12/2021    Significant Diagnostic Results in last 30 days:  No results found.  Assessment/Plan  1. Hypokalemia due to excessive renal loss of potassium Refill Potassium chloride  - potassium chloride (MICRO-K) 10 MEQ CR capsule; Take 2 capsules (20 mEq total) by mouth 2 (two) times daily as needed (when taking a dose of lasix.).  Dispense: 60 capsule; Refill: 0  2. Localized edema Furosemide as below - check weight daily and notify provider for abrupt weight gain  - furosemide (LASIX) 20 MG tablet; TAKE 1  TABLET BY MOUTH TWICE DAILY AS NEEDED FOR 3 POUND WEIGHT GAIN IN ONE DAY OR 5 POUNDS IN ONE WEEK.Take 2 tablet daily for 3 days then resume as needed.  Dispense: 60 tablet; Refill: 0  3. Dysuria Afebrile  Reports dysuria,urine frequency,lower back pain  - POC Urinalysis Dipstick indicates yellow cloudy urine positive for ketones ,trace blood,Nitrites and large 3 + Leukocytes  - Urine Culture  4. Urinary frequency Afebrile  - will send urine culture  - POC Urinalysis Dipstick - Urine Culture  5. Candidiasis, intertrigo skin fold extensive redness under the breast and abdomen fold. - fluconazole (DIFLUCAN) 150 MG tablet; Take 1 tablet (150 mg total)  by mouth daily for 5 days.  Dispense: 5 tablet; Refill: 0  Family/ staff Communication: Reviewed plan of care with patient verbalized understanding   Labs/tests ordered:  - POC Urinalysis Dipstick - Urine Culture  Next Appointment: As needed if symptoms worsen or fail to improve    Sandrea Hughs, NP

## 2022-02-04 ENCOUNTER — Other Ambulatory Visit: Payer: Self-pay

## 2022-02-04 ENCOUNTER — Other Ambulatory Visit: Payer: Self-pay | Admitting: Nurse Practitioner

## 2022-02-04 LAB — URINE CULTURE
MICRO NUMBER:: 13519873
SPECIMEN QUALITY:: ADEQUATE

## 2022-02-04 MED ORDER — CEPHALEXIN 500 MG PO CAPS: 500.0000 mg | ORAL_CAPSULE | Freq: Three times a day (TID) | ORAL | 0 refills | Status: DC

## 2022-02-04 NOTE — Progress Notes (Signed)
Open in error medication was already send into pharmacy by provider.

## 2022-02-10 ENCOUNTER — Telehealth: Payer: Self-pay | Admitting: Registered Nurse

## 2022-02-10 ENCOUNTER — Encounter: Payer: Medicare Other | Admitting: Registered Nurse

## 2022-02-10 MED ORDER — OXYCODONE-ACETAMINOPHEN 10-325 MG PO TABS: 1.0000 | ORAL_TABLET | Freq: Four times a day (QID) | ORAL | 0 refills | Status: DC | PRN

## 2022-02-10 NOTE — Telephone Encounter (Signed)
Ms. Marovich, ill today.  PMP was Reviewed.  Oxycodone e-scribed today.  She has an appointment with Dr Ranell Patrick in July.

## 2022-02-10 NOTE — Addendum Note (Signed)
Addended by: Bayard Hugger on: 02/10/2022 11:04 AM   Modules accepted: Orders

## 2022-02-10 NOTE — Telephone Encounter (Signed)
Katherine Delgado is sick today and unable to come to the office she states she has 23 Pills remaining and has an apt on 7/24 with Dr Ranell Patrick. Please advise if we need to reschedule for sooner, or if her medication may be called in

## 2022-02-17 ENCOUNTER — Ambulatory Visit (INDEPENDENT_AMBULATORY_CARE_PROVIDER_SITE_OTHER): Payer: Medicare Other | Admitting: Family

## 2022-02-17 ENCOUNTER — Encounter: Payer: Self-pay | Admitting: Family

## 2022-02-17 DIAGNOSIS — Z0000 Encounter for general adult medical examination without abnormal findings: Secondary | ICD-10-CM

## 2022-02-17 NOTE — Progress Notes (Signed)
This service is provided via telemedicine  No vital signs collected/recorded due to the encounter was a telemedicine visit.   Location of patient (ex: home, work):  Home.  Patient consents to a telephone visit:  Yes  Location of the provider (ex: office, home):  [employer].  Name of any referring provider:  Heydy Montilla, Nelda Bucks, NP   Names of all persons participating in the telemedicine service and their role in the encounter:  Patient, [name], [place], [place], Webb Silversmith, NP.    Time spent on call: 8 minutes spent on the phone with Medical Assistant.       Subjective:   Katherine Delgado is a 81 y.o. female who presents for Medicare Annual (Subsequent) preventive examination.  Review of Systems     Cardiac Risk Factors include: advanced age (>25mn, >>84women);diabetes mellitus;hypertension;obesity (BMI >30kg/m2);sedentary lifestyle;dyslipidemia;smoking/ tobacco exposure     Objective:    Today's Vitals   02/17/22 1132  PainSc: 7    There is no height or weight on file to calculate BMI.     02/17/2022   11:26 AM 02/01/2022    1:50 PM 06/24/2021    9:40 AM 04/12/2021   11:43 AM 03/31/2021    8:20 AM 02/12/2021   10:17 AM 02/10/2021   11:20 AM  Advanced Directives  Does Patient Have a Medical Advance Directive? Yes Yes Yes No No Yes Yes  Type of Advance Directive Out of facility DNR (pink MOST or yellow form) Out of facility DNR (pink MOST or yellow form) Healthcare Power of [place]  Does patient want to make changes to medical advance directive? No - Patient declined No - Patient declined  No - Patient declined  No - Patient declined No - Patient declined    Current Medications (verified) Outpatient Encounter Medications as of 02/17/2022  Medication Sig   acetaminophen (TYLENOL) 325 MG tablet Take 650 mg by mouth every 6 (six) hours as needed (for pain.).   amitriptyline (ELAVIL) 50 MG tablet Take 1 tablet (50 mg total) by mouth  at bedtime.   amLODipine (NORVASC) 10 MG tablet TAKE 1 TABLET(10 MG) BY MOUTH DAILY   aspirin EC 81 MG tablet Take 81 mg by mouth 2 (two) times a week.   augmented betamethasone dipropionate (DIPROLENE-AF) 0.05 % cream Apply topically 2 (two) times daily as needed.   calamine lotion Apply 1 application topically 3 (three) times daily. To affected areas on right fore arm   cholecalciferol (VITAMIN D3) 25 MCG (1000 UT) tablet Take 1,000 Units by mouth daily.   Cranberry 475 MG CAPS Take 1 capsule (475 mg total) by mouth 2 (two) times daily.   docusate sodium (COLACE) 100 MG capsule Take 100 mg by mouth daily as needed for mild constipation.   DULoxetine (CYMBALTA) 20 MG capsule TAKE 1 CAPSULE(20 MG) BY MOUTH DAILY   ezetimibe (ZETIA) 10 MG tablet Take 1 tablet (10 mg total) by mouth daily.   fluticasone (CUTIVATE) 0.05 % cream Apply topically 2 (two) times daily as needed.   folic acid (FOLVITE) 1 MG tablet Take 2 tablets (2 mg total) by mouth daily.   furosemide (LASIX) 20 MG tablet TAKE 1 TABLET BY MOUTH TWICE DAILY AS NEEDED FOR 3 POUND WEIGHT GAIN IN ONE DAY OR 5 POUNDS IN ONE WEEK.Take 2 tablet daily for 3 days then resume as needed.   gabapentin (NEURONTIN) 600 MG tablet TAKE 1 TABLET(600 MG) BY MOUTH THREE TIMES DAILY  insulin glargine, 1 Unit Dial, (TOUJEO SOLOSTAR) 300 UNIT/ML Solostar Pen Inject 90 Units into the skin daily in the afternoon.   insulin lispro (HUMALOG KWIKPEN) 200 UNIT/ML KwikPen Max daily 80 units   Insulin Pen Needle (B-D UF III MINI PEN NEEDLES) 31G X 5 MM MISC USE AS DIRECTED   ipratropium-albuterol (DUONEB) 0.5-2.5 (3) MG/3ML SOLN Take 3 mLs by nebulization every 6 (six) hours as needed.   ketoconazole (NIZORAL) 2 % cream Apply 1 application topically 2 (two) times daily.   levothyroxine (SYNTHROID) 112 MCG tablet Take 1 tablet (112 mcg total) by mouth daily.   lidocaine (LIDODERM) 5 % PLACE 1 PATCH ONTO THE SKIN DAILY AS DIRECTED, REMOVE AND DISCARD PATCH WITHN 12  HOURS OR AS DIRECTED BY MD   liraglutide (VICTOZA) 18 MG/3ML SOPN Administer 1.'8mg'$  under the skin daily for blood sugar.   lisinopril (ZESTRIL) 20 MG tablet TAKE 1 TABLET BY MOUTH EVERY DAY   loperamide (IMODIUM A-D) 2 MG tablet Take 2 mg by mouth 4 (four) times daily as needed for diarrhea or loose stools.   Melatonin 10 MG TABS Take 10 mg by mouth at bedtime as needed (sleep).   Menthol, Topical Analgesic, (ICY HOT MEDICATED SPRAY EX) Apply 1 application topically as needed (shoulder pain).   metoprolol tartrate (LOPRESSOR) 50 MG tablet TAKE 1 TABLET BY MOUTH TWICE DAILY   mirabegron ER (MYRBETRIQ) 50 MG TB24 tablet Take 1 tablet (50 mg total) by mouth daily.   Multiple Vitamins-Minerals (MULTIVITAMIN ADULT PO) Take 1 tablet by mouth daily.   nystatin (MYCOSTATIN/NYSTOP) powder Apply 1 application topically 3 (three) times daily.   nystatin (NYSTATIN) powder APPLY TOPICALLY THREE TIMES DAILY AS DIRECTED FOR 14 DAYS and prn yeast infection   nystatin cream (MYCOSTATIN) Apply 1 application topically 3 (three) times daily. To skin folds   omega-3 acid ethyl esters (LOVAZA) 1 g capsule TAKE ONE CAPSULE BY MOUTH EVERY DAY   oxyCODONE-acetaminophen (PERCOCET) 10-325 MG tablet Take 1 tablet by mouth every 6 (six) hours as needed for pain. Do Not Fill Before 02/14/2022   polyvinyl alcohol (LIQUIFILM TEARS) 1.4 % ophthalmic solution Place 1 drop into both eyes 4 (four) times daily as needed (for dry/irritated eyes).    potassium chloride (MICRO-K) 10 MEQ CR capsule Take 2 capsules (20 mEq total) by mouth 2 (two) times daily as needed (when taking a dose of lasix.).   saccharomyces boulardii (FLORASTOR) 250 MG capsule Take 1 capsule (250 mg total) by mouth 2 (two) times daily. For 10 days.   sodium chloride (OCEAN) 0.65 % nasal spray Place 2 sprays into the nose as needed for congestion.   tiotropium (SPIRIVA) 18 MCG inhalation capsule Place 18 mcg into inhaler and inhale as needed.   tiZANidine  (ZANAFLEX) 2 MG tablet TAKE 1 TABLET(2 MG) BY MOUTH THREE TIMES DAILY   triamterene-hydrochlorothiazide (MAXZIDE-25) 37.5-25 MG tablet TAKE 1 TABLET BY MOUTH DAILY   venlafaxine XR (EFFEXOR-XR) 75 MG 24 hr capsule TAKE 1 CAPSULE BY MOUTH EVERY DAY WITH BREAKFAST   XELJANZ 5 MG TABS TAKE 1 TABLET BY MOUTH DAILY   [DISCONTINUED] cephALEXin (KEFLEX) 500 MG capsule Take 1 capsule (500 mg total) by mouth 3 (three) times daily.   No facility-administered encounter medications on file as of 02/17/2022.    Allergies (verified) Influenza vaccines, Penicillins, Statins, and Sulfamethoxazole-trimethoprim   History: Past Medical History:  Diagnosis Date   Abnormality of gait 04/19/2016   Acute infective polyneuritis ([place]) 2002   Allergic rhinitis due to  pollen    Chronic pain syndrome    COPD (chronic obstructive pulmonary disease) (HCC)    chronic bronchitis   Depressive disorder, not elsewhere classified    Diabetes mellitus without complication ([place])    Diaphragmatic hernia without mention of obstruction or gangrene    Dyslipidemia    Fibromyalgia    GERD (gastroesophageal reflux disease)    with h/o esophagitis   Guillain-Barre syndrome (HCC)    History of benign thymus tumor    Insomnia, unspecified    Lumbar spinal stenosis 03/30/2018   L4-5 level, severe   Miscarriage 1962   Mixed hyperlipidemia    Morbid obesity ([place])    Osteoporosis    Pneumonia    Polyneuropathy in diabetes(357.2)    Rheumatoid arthritis(714.0)    Spondylosis, lumbosacral    Spontaneous ecchymoses    Type II or unspecified type diabetes mellitus with peripheral circulatory disorders, uncontrolled(250.72)    Unspecified chronic bronchitis (HCC)    Unspecified essential hypertension    Unspecified hypothyroidism    Unspecified pruritic disorder    Unspecified urinary incontinence    Past Surgical History:  Procedure Laterality Date   ABDOMINAL HYSTERECTOMY  1974   abdominal tumor  2002   APPENDECTOMY      APPLICATION OF A-CELL OF BACK N/A 09/26/2018   Procedure: With Acell;  Surgeon: Wallace Going, DO;  Location: [facility];  Service: Plastics;  Laterality: N/A;   APPLICATION OF WOUND VAC N/A 09/26/2018   Procedure: Vac placement;  Surgeon: Wallace Going, DO;  Location: [facility];  Service: Plastics;  Laterality: N/A;   BACK SURGERY  1982   BRONCHOSCOPY  2001   CATARACT EXTRACTION, BILATERAL  09/2019, 10/2019   CHOLECYSTECTOMY  1984   INCISION AND DRAINAGE OF WOUND N/A 09/26/2018   Procedure: Debridement of spine wound;  Surgeon: Wallace Going, DO;  Location: [facility];  Service: Plastics;  Laterality: N/A;   KNEE SURGERY Bilateral 08/27/2010 (L) and 01/11/2011 (R)   LUMBAR LAMINECTOMY/DECOMPRESSION MICRODISCECTOMY N/A 07/03/2018   Procedure: Lumbar Three to Lumbar Five Laminectomy;  Surgeon: Judith Part, MD;  Location: [facility];  Service: Neurosurgery;  Laterality: N/A;   LUMBAR WOUND DEBRIDEMENT N/A 08/20/2018   Procedure: POSTERIOR LUMBAR SPINAL WOUND DEBRIDEMENT AND REVISION;  Surgeon: Judith Part, MD;  Location: [facility];  Service: Neurosurgery;  Laterality: N/A;  POSTERIOR LUMBAR SPINAL WOUND REVISION   miscarrage  1962   OVARIAN CYST SURGERY  1968   thymus tumor     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   TONSILLECTOMY     Family History  Problem Relation Age of Onset   Alzheimer's disease Mother    Heart disease Mother    Heart disease Father    Liver disease Father    Cancer Brother    Arthritis Son    Colon polyps Brother    Colon cancer Neg Hx    Esophageal cancer Neg Hx    Kidney disease Neg Hx    Stomach cancer Neg Hx    Rectal cancer Neg Hx    Social History   Socioeconomic History   Marital status: Widowed    Spouse name: Not on file   Number of children: 2   Years of education: 14   Highest education level: Not on file  Occupational History   Occupation: Retired  Tobacco Use   Smoking status: Former    Packs/day: 0.10    Years: 10.00    Total pack years:  1.00  Types: Cigarettes    Quit date: 12/07/1979    Years since quitting: 42.2    Passive exposure: Current   Smokeless tobacco: Never  Vaping Use   Vaping Use: Never used  Substance and Sexual Activity   Alcohol use: No    Alcohol/week: 0.0 standard drinks of alcohol   Drug use: No   Sexual activity: Not Currently  Other Topics Concern   Not on file  Social History Narrative   Walks with cane   Right handed    Caffeine use: Coffee (2 cups every morning)   Tea: sometimes   Soda: none   Social Determinants of Health   Financial Resource Strain: Medium Risk (10/05/2017)   Overall Financial Resource Strain (CARDIA)    Difficulty of Paying Living Expenses: Somewhat hard  Food Insecurity: Food Insecurity Present (10/05/2017)   Hunger Vital Sign    Worried About Running Out of Food in the Last Year: Sometimes true    Ran Out of Food in the Last Year: Sometimes true  Transportation Needs: No Transportation Needs (10/05/2017)   PRAPARE - Hydrologist (Medical): No    Lack of Transportation (Non-Medical): No  Physical Activity: Inactive (10/05/2017)   Exercise Vital Sign    Days of Exercise per Week: 0 days    Minutes of Exercise per Session: 0 min  Stress: Stress Concern Present (10/05/2017)   [place]    Feeling of Stress : Rather much  Social Connections: Moderately Isolated (10/05/2017)   Social Connection and Isolation Panel [NHANES]    Frequency of Communication with Friends and Family: More than three times a week    Frequency of Social Gatherings with Friends and Family: More than three times a week    Attends Religious Services: Never    Marine scientist or Organizations: No    Attends Archivist Meetings: Never    Marital Status: Widowed    Tobacco Counseling Counseling given: Not Answered   Clinical Intake:  Pre-visit preparation completed: No  Pain  : 0-10 Pain Score: 7  Pain Type: Chronic pain Pain Location: Knee Pain Orientation: Other (Comment) (bilateral hand,knees,back) Pain Radiating Towards: No Pain Descriptors / Indicators: Aching Pain Onset: Other (comment) (several years) Pain Frequency: Constant Pain Relieving Factors: pain medication Effect of Pain on Daily Activities: sleeping,walking  Pain Relieving Factors: pain medication  BMI - recorded: 38.03 Nutritional Status: BMI > 30  Obese Nutritional Risks: None Diabetes: Yes CBG done?: Yes CBG resulted in Enter/ Edit results?: No (reports on the phone 113) Did pt. bring in CBG monitor from home?:  (report 113)  How often do you need to have someone help you when you read instructions, pamphlets, or other written materials from your doctor or pharmacy?: 1 - Never What is the last grade level you completed in school?: 2 yrs of college  Diabetic?Yes   Interpreter Needed?: No      Activities of Daily Living    02/17/2022   11:43 AM  In your present state of health, do you have any difficulty performing the following activities:  Hearing? 1  Vision? 0  Difficulty concentrating or making decisions? 1  Comment remembering  Walking or climbing stairs? 1  Comment wheelchair dependant  Dressing or bathing? 0  Doing errands, shopping? 1  Comment son [place] and eating ? Y  Comment son assist  Using the Toilet? N  In the past  six months, have you accidently leaked urine? Y  Do you have problems with loss of bowel control? N  Managing your Medications? N  Managing your Finances? N  Housekeeping or managing your Housekeeping? Y  Comment son assist    Patient Care Team: Jonnie Truxillo, Nelda Bucks, NP as PCP - General (Family Medicine) Bo Merino, MD (Rheumatology) [place], Legrand Como, MD as Attending Physician (Endocrinology) Newt Minion, MD as Consulting Physician (Orthopedic Surgery)  Indicate any recent Medical Services you may have  received from other than [facility] providers in the past year (date may be approximate).     Assessment:   This is a routine wellness examination for Katherine Delgado.  Hearing/Vision screen Hearing Screening - Comments:: No Hearing Concerns. Patient got hearing check 2022. Vision Screening - Comments:: No Vision Concerns. Patient wears reading glasses. Patient last Eye exam 2022.  Dietary issues and exercise activities discussed: Current Exercise Habits: The patient does not participate in regular exercise at present, Exercise limited by: Other - see comments (limited due chronicn back ,knee and hand pain)   Goals Addressed             This Visit's Progress    Patient Stated   Not on track    Patient would like to get back into church.     Patient Stated   Not on track    Would like to loss weight under 225 lbs        Depression Screen    02/17/2022   11:22 AM 01/11/2022    9:09 AM 12/14/2021    9:15 AM 08/02/2021   10:15 AM 06/29/2021   10:03 AM 06/01/2021   10:00 AM 04/27/2021   10:47 AM  PHQ 2/9 Scores  PHQ - 2 Score 0 0 0 2 0 2 0    Fall Risk    02/17/2022   11:22 AM 02/01/2022    1:50 PM 01/11/2022    9:09 AM 12/14/2021    9:15 AM 09/21/2021   10:00 AM  Fall Risk   Falls in the past year? 0 0 0 0 0  Number falls in past yr: 0 0 0 0 0  Injury with Fall? 0 0   0  Risk for fall due to : No Fall Risks No Fall Risks     Follow up Falls evaluation completed Falls evaluation completed       FALL RISK PREVENTION PERTAINING TO THE HOME:  Any stairs in or around the home? Yes  If so, are there any without handrails? No  Home free of loose throw rugs in walkways, pet beds, electrical cords, etc? No  Adequate lighting in your home to reduce risk of falls? Yes   ASSISTIVE DEVICES UTILIZED TO PREVENT FALLS:  Life alert? No  Use of a cane, walker or w/c? Yes  Grab bars in the bathroom? Yes  Shower chair or bench in shower? Yes  Elevated toilet seat or a handicapped toilet? Yes    TIMED UP AND GO:  Was the test performed? No .  Length of time to ambulate 10 feet: N/A sec.   Gait unsteady without use of assistive device, provider informed and interventions were implemented  Cognitive Function:    10/05/2017   10:13 AM 07/07/2016    9:17 AM  MMSE - Mini Mental State Exam  Orientation to time 5 5  Orientation to Place 5 5  Registration 3 3  Attention/ Calculation 5 5  Recall 3 3  Language- name 2 objects  2 2  Language- repeat 1 1  Language- follow 3 step command 3 3  Language- read & follow direction 1 1  Write a sentence 1 1  Copy design 1 1  Total score 30 30        02/17/2022   11:24 AM 02/10/2021   11:17 AM 01/14/2020    1:30 PM 01/11/2019    2:30 PM  6CIT Screen  What Year? 0 points 0 points 0 points 0 points  What month? 0 points 0 points 0 points 0 points  What time? 0 points 0 points 0 points 0 points  Count back from 20 0 points 0 points 0 points 0 points  Months in reverse 0 points 0 points 0 points 0 points  Repeat phrase 2 points 4 points 0 points 0 points  Total Score 2 points 4 points 0 points 0 points    Immunizations Immunization History  Administered Date(s) Administered   Influenza-Unspecified 06/10/2011   PFIZER(Purple Top)SARS-COV-2 Vaccination 11/13/2019, 11/30/2019   Pneumococcal Conjugate-13 07/07/2016   Pneumococcal Polysaccharide-23 11/21/2003, 10/05/2017   Tdap 05/28/2016    TDAP status: Up to date  Flu Vaccine status: Declined, Education has been provided regarding the importance of this vaccine but patient still declined. Advised may receive this vaccine at local pharmacy or Health Dept. Aware to provide a copy of the vaccination record if obtained from local pharmacy or Health Dept. Verbalized acceptance and understanding.  Pneumococcal vaccine status: Up to date  Covid-19 vaccine status: Information provided on how to obtain vaccines.   Qualifies for Shingles Vaccine? Yes   Zostavax completed No    Shingrix Completed?: No.    Education has been provided regarding the importance of this vaccine. Patient has been advised to call insurance company to determine out of pocket expense if they have not yet received this vaccine. Advised may also receive vaccine at local pharmacy or Health Dept. Verbalized acceptance and understanding.  Screening Tests Health Maintenance  Topic Date Due   Zoster Vaccines- Shingrix (1 of 2) Never done   COVID-19 Vaccine (3 - Pfizer risk series) 12/28/2019   FOOT EXAM  10/15/2021   HEMOGLOBIN A1C  03/02/2022   OPHTHALMOLOGY EXAM  03/25/2022   DEXA SCAN  09/20/2025   TETANUS/TDAP  05/28/2026   Pneumonia Vaccine 104+ Years old  Completed   HPV VACCINES  Aged Out   INFLUENZA VACCINE  Discontinued    Health Maintenance  Health Maintenance Due  Topic Date Due   Zoster Vaccines- Shingrix (1 of 2) Never done   COVID-19 Vaccine (3 - Pfizer risk series) 12/28/2019   FOOT EXAM  10/15/2021    Colorectal cancer screening: No longer required.   Mammogram status: No longer required due to age.  Bone Density status: Completed 09/21/2015. Results reflect: Bone density results: OSTEOPENIA. Repeat every 5 years.  Lung Cancer Screening: (Low Dose CT Chest recommended if Age 20-80 years, 30 pack-year currently smoking OR have quit w/in 15years.) does not qualify.   Lung Cancer Screening Referral: No   Additional Screening:  Hepatitis C Screening: does not qualify; Completed No   Vision Screening: Recommended annual ophthalmology exams for early detection of glaucoma and other disorders of the eye. Is the patient up to date with their annual eye exam?  Yes  Who is the provider or what is the name of the office in which the patient attends annual eye exams? [facility]  If pt is not established with a provider, would they like to  be referred to a provider to establish care? No .   Dental Screening: Recommended annual dental exams for proper oral  hygiene  Community Resource Referral / Chronic Care Management: CRR required this visit?  No   CCM required this visit?  No      Plan:     I have personally reviewed and noted the following in the patient's chart:   Medical and social history Use of alcohol, tobacco or illicit drugs  Current medications and supplements including opioid prescriptions.  Functional ability and status Nutritional status Physical activity Advanced directives List of other physicians Hospitalizations, surgeries, and ER visits in previous 12 months Vitals Screenings to include cognitive, depression, and falls Referrals and appointments  In addition, I have reviewed and discussed with patient certain preventive protocols, quality metrics, and best practice recommendations. A written personalized care plan for preventive services as well as general preventive health recommendations were provided to patient.     Sandrea Hughs, NP   02/17/2022   Nurse Notes: Advised to get Shingles vaccine and COVID-19 vaccine at the pharmacy advanced age

## 2022-02-17 NOTE — Patient Instructions (Signed)
Katherine Delgado , Thank you for taking time to come for your Medicare Wellness Visit. I appreciate your ongoing commitment to your health goals. Please review the following plan we discussed and let me know if I can assist you in the future.   Screening recommendations/referrals: Colonoscopy : N/A  Mammogram : Due  Bone Density : Due  Recommended yearly ophthalmology/optometry visit for glaucoma screening and checkup Recommended yearly dental visit for hygiene and checkup  Vaccinations: Influenza vaccine : N/A  Pneumococcal vaccine : Up to date  Tdap vaccine : Up to date  Shingles vaccine : Due please get vaccine at your pharmacy   COVID-19 booster vaccine : due please get booster vaccine at your pharmacy    Advanced directives: Yes   Conditions/risks identified: Advanced age > woman, diabetes mellitus, hypertension, obesity BMI greater than 30, sedentary lifestyle and history of smoking  Next appointment: 1 year   Preventive Care 16 Years and Older, Female Preventive care refers to lifestyle choices and visits with your health care provider that can promote health and wellness. What does preventive care include? A yearly physical exam. This is also called an annual well check. Dental exams once or twice a year. Routine eye exams. Ask your health care provider how often you should have your eyes checked. Personal lifestyle choices, including: Daily care of your teeth and gums. Regular physical activity. Eating a healthy diet. Avoiding tobacco and drug use. Limiting alcohol use. Practicing safe sex. Taking low-dose aspirin every day. Taking vitamin and mineral supplements as recommended by your health care provider. What happens during an annual well check? The services and screenings done by your health care provider during your annual well check will depend on your age, overall health, lifestyle risk factors, and family history of disease. Counseling  Your health care provider  may ask you questions about your: Alcohol use. Tobacco use. Drug use. Emotional well-being. Home and relationship well-being. Sexual activity. Eating habits. History of falls. Memory and ability to understand (cognition). Work and work Statistician. Reproductive health. Screening  You may have the following tests or measurements: Height, weight, and BMI. Blood pressure. Lipid and cholesterol levels. These may be checked every 5 years, or more frequently if you are over 68 years old. Skin check. Lung cancer screening. You may have this screening every year starting at age 62 if you have a 30-pack-year history of smoking and currently smoke or have quit within the past 15 years. Fecal occult blood test (FOBT) of the stool. You may have this test every year starting at age 42. Flexible sigmoidoscopy or colonoscopy. You may have a sigmoidoscopy every 5 years or a colonoscopy every 10 years starting at age 58. Hepatitis C blood test. Hepatitis B blood test. Sexually transmitted disease (STD) testing. Diabetes screening. This is done by checking your blood sugar (glucose) after you have not eaten for a while (fasting). You may have this done every 1-3 years. Bone density scan. This is done to screen for osteoporosis. You may have this done starting at age 75. Mammogram. This may be done every 1-2 years. Talk to your health care provider about how often you should have regular mammograms. Talk with your health care provider about your test results, treatment options, and if necessary, the need for more tests. Vaccines  Your health care provider may recommend certain vaccines, such as: Influenza vaccine. This is recommended every year. Tetanus, diphtheria, and acellular pertussis (Tdap, Td) vaccine. You may need a Td booster every 10  years. Zoster vaccine. You may need this after age 61. Pneumococcal 13-valent conjugate (PCV13) vaccine. One dose is recommended after age 45. Pneumococcal  polysaccharide (PPSV23) vaccine. One dose is recommended after age 49. Talk to your health care provider about which screenings and vaccines you need and how often you need them. This information is not intended to replace advice given to you by your health care provider. Make sure you discuss any questions you have with your health care provider. Document Released: 09/04/2015 Document Revised: 04/27/2016 Document Reviewed: 06/09/2015 Elsevier Interactive Patient Education  2017 [place] Prevention in the Home Falls can cause injuries. They can happen to people of all ages. There are many things you can do to make your home safe and to help prevent falls. What can I do on the outside of my home? Regularly fix the edges of walkways and driveways and fix any cracks. Remove anything that might make you trip as you walk through a door, such as a raised step or threshold. Trim any bushes or trees on the path to your home. Use bright outdoor lighting. Clear any walking paths of anything that might make someone trip, such as rocks or tools. Regularly check to see if handrails are loose or broken. Make sure that both sides of any steps have handrails. Any raised decks and porches should have guardrails on the edges. Have any leaves, snow, or ice cleared regularly. Use sand or salt on walking paths during winter. Clean up any spills in your garage right away. This includes oil or grease spills. What can I do in the bathroom? Use night lights. Install grab bars by the toilet and in the tub and shower. Do not use towel bars as grab bars. Use non-skid mats or decals in the tub or shower. If you need to sit down in the shower, use a plastic, non-slip stool. Keep the floor dry. Clean up any water that spills on the floor as soon as it happens. Remove soap buildup in the tub or shower regularly. Attach bath mats securely with double-sided non-slip rug tape. Do not have throw rugs and other  things on the floor that can make you trip. What can I do in the bedroom? Use night lights. Make sure that you have a light by your bed that is easy to reach. Do not use any sheets or blankets that are too big for your bed. They should not hang down onto the floor. Have a firm chair that has side arms. You can use this for support while you get dressed. Do not have throw rugs and other things on the floor that can make you trip. What can I do in the kitchen? Clean up any spills right away. Avoid walking on wet floors. Keep items that you use a lot in easy-to-reach places. If you need to reach something above you, use a strong step stool that has a grab bar. Keep electrical cords out of the way. Do not use floor polish or wax that makes floors slippery. If you must use wax, use non-skid floor wax. Do not have throw rugs and other things on the floor that can make you trip. What can I do with my stairs? Do not leave any items on the stairs. Make sure that there are handrails on both sides of the stairs and use them. Fix handrails that are broken or loose. Make sure that handrails are as long as the stairways. Check any carpeting to make sure  that it is firmly attached to the stairs. Fix any carpet that is loose or worn. Avoid having throw rugs at the top or bottom of the stairs. If you do have throw rugs, attach them to the floor with carpet tape. Make sure that you have a light switch at the top of the stairs and the bottom of the stairs. If you do not have them, ask someone to add them for you. What else can I do to help prevent falls? Wear shoes that: Do not have high heels. Have rubber bottoms. Are comfortable and fit you well. Are closed at the toe. Do not wear sandals. If you use a stepladder: Make sure that it is fully opened. Do not climb a closed stepladder. Make sure that both sides of the stepladder are locked into place. Ask someone to hold it for you, if possible. Clearly  mark and make sure that you can see: Any grab bars or handrails. First and last steps. Where the edge of each step is. Use tools that help you move around (mobility aids) if they are needed. These include: Canes. Walkers. Scooters. Crutches. Turn on the lights when you go into a dark area. Replace any light bulbs as soon as they burn out. Set up your furniture so you have a clear path. Avoid moving your furniture around. If any of your floors are uneven, fix them. If there are any pets around you, be aware of where they are. Review your medicines with your doctor. Some medicines can make you feel dizzy. This can increase your chance of falling. Ask your doctor what other things that you can do to help prevent falls. This information is not intended to replace advice given to you by your health care provider. Make sure you discuss any questions you have with your health care provider. Document Released: 06/04/2009 Document Revised: 01/14/2016 Document Reviewed: 09/12/2014 Elsevier Interactive Patient Education  2017 [employer].

## 2022-02-21 ENCOUNTER — Other Ambulatory Visit: Payer: Self-pay | Admitting: Physician Assistant

## 2022-02-21 NOTE — Telephone Encounter (Signed)
Next Visit:  Message sent to front desk to schedule appt. Return in about 3 months (around 12/28/2021) for Rheumatoid arthritis  Last Visit: 09/30/2021  Last Fill: 11/22/2021  DX: Rheumatoid arthritis involving multiple sites with positive rheumatoid factor   Current Dose per office note 09/30/2021: Morrie Sheldon 5 mg 1 tablet by mouth daily.   Labs: 09/30/2021, CBC and CMP are stable. [facility] labs are due.  TB Gold: 09/30/2021 TB Gold is negative.  Okay to refill Morrie Sheldon?

## 2022-02-21 NOTE — Telephone Encounter (Signed)
Please call patient to schedule appt.  Return in about 3 months (around 12/28/2021) for Rheumatoid arthritis Thank you.

## 2022-02-25 NOTE — Progress Notes (Unsigned)
Office Visit Note  Patient: Katherine Delgado             Date of Birth: May 06, 1941           MRN: [id number]             PCP: Sandrea Hughs, NP Referring: Sandrea Hughs, NP Visit Date: 03/03/2022 Occupation: '@GUAROCC'$ @  Subjective:  No chief complaint on file.   History of Present Illness: Katherine Delgado is a 81 y.o. female ***   Activities of Daily Living:  Patient reports morning stiffness for *** {minute/hour:19697}.   Patient {ACTIONS;DENIES/REPORTS:21021675::"Denies"} nocturnal pain.  Difficulty dressing/grooming: {ACTIONS;DENIES/REPORTS:21021675::"Denies"} Difficulty climbing stairs: {ACTIONS;DENIES/REPORTS:21021675::"Denies"} Difficulty getting out of chair: {ACTIONS;DENIES/REPORTS:21021675::"Denies"} Difficulty using hands for taps, buttons, cutlery, and/or writing: {ACTIONS;DENIES/REPORTS:21021675::"Denies"}  No Rheumatology ROS completed.   PMFS History:  Patient Active Problem List   Diagnosis Date Noted  . Left chronic serous otitis media 05/11/2021  . 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 Primary osteoarthritis of right knee 07/25/2019  . Wound dehiscence 08/17/2018  . Lumbar spinal stenosis 03/30/2018  . Rheumatoid arthritis involving both wrists with positive rheumatoid factor ([place]) 06/14/2016  . High risk medication use 06/14/2016  . Sjogren's syndrome ([place]) 06/14/2016  . Primary osteoarthritis of both knees 06/14/2016  . DDD (degenerative disc disease), lumbar 06/14/2016  . Hypothyroidism 06/14/2016  . Osteoporosis 06/14/2016  . Abnormality of gait 04/19/2016  . Type 2 diabetes mellitus with diabetic polyneuropathy, with long-term current use of insulin ([place]) 11/27/2015  . Diabetes mellitus without complication ([place]) 40/98/1191  . Headache(784.0) 07/31/2013  . Sinus infection 07/31/2013  . Chronic right-sided low back pain with  right-sided sciatica 03/14/2013  . Insomnia 12/06/2012  . Nausea alone 12/06/2012  . Diarrhea 12/06/2012  . Urinary incontinence, urge 12/06/2012  . Lumbosacral root lesions, not elsewhere classified 11/27/2012  . Diabetic polyneuropathy associated with type 2 diabetes mellitus ([place]) 11/27/2012  . EDEMA 05/04/2010  . YEAST INFECTION 05/03/2010  . Hyperlipidemia 05/03/2010  . DEPRESSION 05/03/2010  . History of peripheral neuropathy 05/03/2010  . Essential hypertension 05/03/2010  . ALLERGIC RHINITIS 05/03/2010  . PNEUMONIA 05/03/2010  . COPD (chronic obstructive pulmonary disease) ([place]) 05/03/2010  . GERD 05/03/2010  . Fibromyalgia 05/03/2010  . DYSPNEA 05/03/2010  . CHEST PAIN 05/03/2010    Past Medical History:  Diagnosis Date  . Abnormality of gait 04/19/2016  . Acute infective polyneuritis ([place]) 2002  . Allergic rhinitis due to pollen   . Chronic pain syndrome   . COPD (chronic obstructive pulmonary disease) (HCC)    chronic bronchitis  . Depressive disorder, not elsewhere classified   . Diabetes mellitus without complication ([place])   . Diaphragmatic hernia without mention of obstruction or gangrene   . Dyslipidemia   . Fibromyalgia   . GERD (gastroesophageal reflux disease)    with h/o esophagitis  . Guillain-Barre syndrome ([place])   . History of benign thymus tumor   . Insomnia, unspecified   . Lumbar spinal stenosis 03/30/2018   L4-5 level, severe  . Miscarriage 1962  . Mixed hyperlipidemia   . Morbid obesity ([place])   . Osteoporosis   . Pneumonia   . Polyneuropathy in diabetes(357.2)   . Rheumatoid arthritis(714.0)   . Spondylosis, lumbosacral   . Spontaneous ecchymoses   . Type II or unspecified type diabetes mellitus with peripheral circulatory disorders, uncontrolled(250.72)   . Unspecified chronic bronchitis ([place])   .  Unspecified essential hypertension   . Unspecified hypothyroidism   . Unspecified pruritic disorder   . Unspecified urinary incontinence      Family History  Problem Relation Age of Onset  . Alzheimer's disease Mother   . Heart disease Mother   . Heart disease Father   . Liver disease Father   . Cancer Brother   . Arthritis Son   . Colon polyps Brother   . Colon cancer Neg Hx   . Esophageal cancer Neg Hx   . Kidney disease Neg Hx   . Stomach cancer Neg Hx   . Rectal cancer Neg Hx    Past Surgical History:  Procedure Laterality Date  . ABDOMINAL HYSTERECTOMY  1974  . abdominal tumor  2002  . APPENDECTOMY    . APPLICATION OF A-CELL OF BACK N/A 09/26/2018   Procedure: With [place];  Surgeon: Wallace Going, DO;  Location: [facility];  Service: Plastics;  Laterality: N/A;  . APPLICATION OF WOUND VAC N/A 09/26/2018   Procedure: Vac placement;  Surgeon: Wallace Going, DO;  Location: [facility];  Service: Plastics;  Laterality: N/A;  . [place]  . BRONCHOSCOPY  2001  . CATARACT EXTRACTION, BILATERAL  09/2019, 10/2019  . CHOLECYSTECTOMY  1984  . INCISION AND DRAINAGE OF WOUND N/A 09/26/2018   Procedure: Debridement of spine wound;  Surgeon: Wallace Going, DO;  Location: [facility];  Service: Plastics;  Laterality: N/A;  . KNEE SURGERY Bilateral 08/27/2010 (L) and 01/11/2011 (R)  . LUMBAR LAMINECTOMY/DECOMPRESSION MICRODISCECTOMY N/A 07/03/2018   Procedure: Lumbar Three to Lumbar Five Laminectomy;  Surgeon: Judith Part, MD;  Location: [facility];  Service: Neurosurgery;  Laterality: N/A;  . LUMBAR WOUND DEBRIDEMENT N/A 08/20/2018   Procedure: POSTERIOR LUMBAR SPINAL WOUND DEBRIDEMENT AND REVISION;  Surgeon: Judith Part, MD;  Location: [facility];  Service: Neurosurgery;  Laterality: N/A;  POSTERIOR LUMBAR SPINAL WOUND REVISION  . [place]  . OVARIAN CYST SURGERY  1968  . thymus tumor    . 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 TONSILLECTOMY     Social History   Social History Narrative   Walks with cane   Right handed    Caffeine use: Coffee (2 cups every morning)   Tea: sometimes   Soda: none   Immunization  History  Administered Date(s) Administered  . Influenza-Unspecified 06/10/2011  . PFIZER(Purple Top)SARS-COV-2 Vaccination 11/13/2019, 11/30/2019  . Pneumococcal Conjugate-13 07/07/2016  . Pneumococcal Polysaccharide-23 11/21/2003, 10/05/2017  . Tdap 05/28/2016     Objective: Vital Signs: There were no vitals taken for this visit.   Physical Exam   Musculoskeletal Exam: ***  CDAI Exam: CDAI Score: -- Patient Global: --; Provider Global: -- Swollen: --; Tender: -- Joint Exam 03/03/2022   No joint exam has been documented for this visit   There is currently no information documented on the homunculus. Go to the Rheumatology activity and complete the homunculus joint exam.  Investigation: No additional findings.  Imaging: No results found.  Recent Labs: Lab Results  Component Value Date   WBC 14.7 (H) 09/30/2021   HGB 13.6 09/30/2021   PLT 363 09/30/2021   NA 138 09/30/2021   K 4.0 09/30/2021   CL 101 09/30/2021   CO2 28 09/30/2021   GLUCOSE 105 (H) 09/30/2021   BUN 27 (H) 09/30/2021   CREATININE 1.06 (H) 09/30/2021   BILITOT 0.3 09/30/2021   ALKPHOS 96 01/25/2021   AST 12 09/30/2021   ALT 6 09/30/2021  PROT 7.0 09/30/2021   ALBUMIN 3.5 01/25/2021   CALCIUM 9.4 09/30/2021   GFRAA 65 02/12/2021   QFTBGOLDPLUS NEGATIVE 09/30/2021    Speciality Comments: No specialty comments available.  Procedures:  No procedures performed Allergies: Influenza vaccines, Penicillins, Statins, and Sulfamethoxazole-trimethoprim   Assessment / Plan:     Visit Diagnoses: Rheumatoid arthritis involving multiple sites with positive rheumatoid factor (HCC)  High risk medication use  Sjogren's syndrome with keratoconjunctivitis sicca (HCC)  Primary osteoarthritis of both knees  DDD (degenerative disc disease), lumbar  Age-related osteoporosis without current pathological fracture  Other insomnia  Fibromyalgia  Herpes zoster without complication  History of diabetes  mellitus  History of Guillain-Barre syndrome  History of COPD  History of gastroesophageal reflux (GERD)  History of hypertension  History of depression  History of peripheral neuropathy  Orders: No orders of the defined types were placed in this encounter.  No orders of the defined types were placed in this encounter.   Face-to-face time spent with patient was *** minutes. Greater than 50% of time was spent in counseling and coordination of care.  Follow-Up Instructions: No follow-ups on file.   Ofilia Neas, PA-C  Note - This record has been created using Dragon software.  Chart creation errors have been sought, but may not always  have been located. Such creation errors do not reflect on  the standard of medical care.

## 2022-03-02 ENCOUNTER — Encounter: Payer: Self-pay | Admitting: Internal Medicine

## 2022-03-02 ENCOUNTER — Ambulatory Visit: Payer: Medicare Other | Admitting: Internal Medicine

## 2022-03-02 VITALS — BP 120/70 | HR 73 | Ht 66.0 in | Wt 234.4 lb

## 2022-03-02 DIAGNOSIS — E1142 Type 2 diabetes mellitus with diabetic polyneuropathy: Secondary | ICD-10-CM

## 2022-03-02 DIAGNOSIS — Z794 Long term (current) use of insulin: Secondary | ICD-10-CM

## 2022-03-02 LAB — POCT GLYCOSYLATED HEMOGLOBIN (HGB A1C): Hemoglobin A1C: 6.4 % — AB (ref 4.0–5.6)

## 2022-03-02 LAB — TSH: TSH: 0.24 u[IU]/mL — ABNORMAL LOW (ref 0.35–5.50)

## 2022-03-02 MED ORDER — SEMAGLUTIDE (1 MG/DOSE) 4 MG/3ML ~~LOC~~ SOPN: 1.0000 mg | PEN_INJECTOR | SUBCUTANEOUS | 3 refills | Status: DC

## 2022-03-02 NOTE — Progress Notes (Unsigned)
Name: Katherine Delgado  Age/ Sex: 81 y.o., female   MRN/ DOB: [id number], Jul 04, 1941     PCP: Sandrea Hughs, NP   Reason for Endocrinology Evaluation: Type 2 Diabetes Mellitus/ hypothyriodism  Initial Endocrine Consultative Visit: 10/14/2020    PATIENT IDENTIFIER: Katherine Delgado is a 81 y.o. female with a past medical history of HTN, DM, RA, failed back syndrome and Hypothyroidism.. The patient has followed with Endocrinology clinic since 10/14/2020 for consultative assistance with management of her diabetes.      DIABETIC HISTORY:  Ms. Sinagra was diagnosed with DM many years ago. Her hemoglobin A1c has ranged from Her A1c was ranged from 6.6% in 2019 to 10.3 % in 2022.     On her initial visit to our clinic her A1c was 10.3% .We continued MDI regimen , victoza and started [place] and was provided with a correction scale . A dexcom prescription was sent   Wilder Glade was discontinued due to severe genital irritation    THYROID HISTORY: She has been on LT-4 replacement for years. Was noted to have a consistently low TSH since 05/2020. Per pt has been diagnosed with MNG in the past. No prior sx or biopsies. She was  On Levothyroxine 125 mcg daily which we reduced to 112 mcg daily   SUBJECTIVE:   During the last visit (09/02/2021): A1c 5.9 %, we reduced  MDI regimen and continued Victoza .   Today (03/02/2022): Ms. Schubach is here for a follow up on diabetes and hypothyroidism . She checks her blood sugars multiple times a day through CGM. The patient has had hypoglycemic episodes since the last clinic visit, this is noted during the day following a bolus , she is symptomatic.    Sees rheumatology  for RA   Denies nausea or diarrhea  She is c/o lot of swelling  Was recently seen by her PCP for UTI  She follows with sports medicine 12/2021  She initially said she takes 100 units of Toujeo about when I reminded her that we reduced it , she stated that she has been taking 90 units  ? She continues with fatigue   Prandial if less 170 she has been taking 4 units ?  HOME ENDOCRINE REGIMEN:  Toujeo 45 units BID  Humalog 8 units with each meal  Victoza 1.8 mg daily   CF: Humalog ( BG- 140/30)      CONTINUOUS GLUCOSE MONITORING RECORD INTERPRETATION    Dates of Recording: 6/29-7/07/2022  Sensor description:dexcom  Results statistics:   CGM use % of time 93  Average and SD 133/36  Time in range    88%  % Time Above 180 10  % Time above 250 <1  % Time Below target < 1      Glycemic patterns summary:  Bg's mostly optimal at night and hyperglycemia noted postprandial   Hyperglycemic episodes  postprandial   Hypoglycemic episodes occurred during the night   Overnight periods: trends down with occasional hypoglycemia      DIABETIC COMPLICATIONS: Microvascular complications:  neuropathy Denies: CKD, retinopathy Last Eye Exam: Completed 03/25/2021  Macrovascular complications:   Denies: CAD, CVA, PVD   HISTORY:  Past Medical History:  Past Medical History:  Diagnosis Date   Abnormality of gait 04/19/2016   Acute infective polyneuritis ([place]) 2002   Allergic rhinitis due to pollen    Chronic pain syndrome    COPD (chronic obstructive pulmonary disease) (HCC)    chronic bronchitis   Depressive disorder,  not elsewhere classified    Diabetes mellitus without complication ([place])    Diaphragmatic hernia without mention of obstruction or gangrene    Dyslipidemia    Fibromyalgia    GERD (gastroesophageal reflux disease)    with h/o esophagitis   Guillain-Barre syndrome (HCC)    History of benign thymus tumor    Insomnia, unspecified    Lumbar spinal stenosis 03/30/2018   L4-5 level, severe   Miscarriage 1962   Mixed hyperlipidemia    Morbid obesity ([place])    Osteoporosis    Pneumonia    Polyneuropathy in diabetes(357.2)    Rheumatoid arthritis(714.0)    Spondylosis, lumbosacral    Spontaneous ecchymoses    Type II or unspecified type  diabetes mellitus with peripheral circulatory disorders, uncontrolled(250.72)    Unspecified chronic bronchitis (HCC)    Unspecified essential hypertension    Unspecified hypothyroidism    Unspecified pruritic disorder    Unspecified urinary incontinence    Past Surgical History:  Past Surgical History:  Procedure Laterality Date   ABDOMINAL HYSTERECTOMY  1974   abdominal tumor  2002   APPENDECTOMY     APPLICATION OF A-CELL OF BACK N/A 09/26/2018   Procedure: With Acell;  Surgeon: Wallace Going, DO;  Location: [facility];  Service: Plastics;  Laterality: N/A;   APPLICATION OF WOUND VAC N/A 09/26/2018   Procedure: Vac placement;  Surgeon: Wallace Going, DO;  Location: [facility];  Service: Plastics;  Laterality: N/A;   BACK SURGERY  1982   BRONCHOSCOPY  2001   CATARACT EXTRACTION, BILATERAL  09/2019, 10/2019   CHOLECYSTECTOMY  1984   INCISION AND DRAINAGE OF WOUND N/A 09/26/2018   Procedure: Debridement of spine wound;  Surgeon: Wallace Going, DO;  Location: [facility];  Service: Plastics;  Laterality: N/A;   KNEE SURGERY Bilateral 08/27/2010 (L) and 01/11/2011 (R)   LUMBAR LAMINECTOMY/DECOMPRESSION MICRODISCECTOMY N/A 07/03/2018   Procedure: Lumbar Three to Lumbar Five Laminectomy;  Surgeon: Judith Part, MD;  Location: [facility];  Service: Neurosurgery;  Laterality: N/A;   LUMBAR WOUND DEBRIDEMENT N/A 08/20/2018   Procedure: POSTERIOR LUMBAR SPINAL WOUND DEBRIDEMENT AND REVISION;  Surgeon: Judith Part, MD;  Location: [facility];  Service: Neurosurgery;  Laterality: N/A;  POSTERIOR LUMBAR SPINAL WOUND REVISION   miscarrage  1962   OVARIAN CYST SURGERY  1968   thymus tumor     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   TONSILLECTOMY     Social History:  reports that she quit smoking about 42 years ago. Her smoking use included cigarettes. She has a 1.00 pack-year smoking history. She has been exposed to tobacco smoke. She has never used smokeless tobacco. She reports that she does not drink alcohol  and does not use drugs. Family History:  Family History  Problem Relation Age of Onset   Alzheimer's disease Mother    Heart disease Mother    Heart disease Father    Liver disease Father    Cancer Brother    Arthritis Son    Colon polyps Brother    Colon cancer Neg Hx    Esophageal cancer Neg Hx    Kidney disease Neg Hx    Stomach cancer Neg Hx    Rectal cancer Neg Hx      HOME MEDICATIONS: Allergies as of 03/02/2022       Reactions   Influenza Vaccines Other (See Comments)   "h/o Guillain Barre; dr's told me years ago never to take another flu shot as it could  relapse Guillain Barre; has to do with when vaccine being changed to H1N1 virus"   Penicillins Hives, Itching, Swelling, Rash   Has patient had a PCN reaction causing immediate rash, facial/tongue/throat swelling, SOB or lightheadedness with hypotension:Unknown Has patient had a PCN reaction causing severe rash involving mucus membranes or skin necrosis: Unknown Has patient had a PCN reaction that required hospitalization:Unknown Has patient had a PCN reaction occurring within the last 10 years: Unknown If all of the above answers are "NO", then may proceed with Cephalosporin use.   Statins Other (See Comments)   Myopathy, transaminitis   Sulfamethoxazole-trimethoprim Itching        Medication List        Accurate as of March 02, 2022 10:27 AM. If you have any questions, ask your nurse or doctor.          acetaminophen 325 MG tablet Commonly known as: TYLENOL Take 650 mg by mouth every 6 (six) hours as needed (for pain.).   amitriptyline 50 MG tablet Commonly known as: ELAVIL Take 1 tablet (50 mg total) by mouth at bedtime.   amLODipine 10 MG tablet Commonly known as: NORVASC TAKE 1 TABLET(10 MG) BY MOUTH DAILY   aspirin EC 81 MG tablet Take 81 mg by mouth 2 (two) times a week.   augmented betamethasone dipropionate 0.05 % cream Commonly known as: DIPROLENE-AF Apply topically 2 (two) times daily  as needed.   B-D UF III MINI PEN NEEDLES 31G X 5 MM Misc Generic drug: Insulin Pen Needle USE AS DIRECTED   calamine lotion Apply 1 application topically 3 (three) times daily. To affected areas on right fore arm   cholecalciferol 25 MCG (1000 UNIT) tablet Commonly known as: VITAMIN D3 Take 1,000 Units by mouth daily.   Cranberry 475 MG Caps Take 1 capsule (475 mg total) by mouth 2 (two) times daily.   docusate sodium 100 MG capsule Commonly known as: COLACE Take 100 mg by mouth daily as needed for mild constipation.   DULoxetine 20 MG capsule Commonly known as: CYMBALTA TAKE 1 CAPSULE(20 MG) BY MOUTH DAILY   ezetimibe 10 MG tablet Commonly known as: ZETIA Take 1 tablet (10 mg total) by mouth daily.   fluticasone 0.05 % cream Commonly known as: CUTIVATE Apply topically 2 (two) times daily as needed.   folic acid 1 MG tablet Commonly known as: FOLVITE Take 2 tablets (2 mg total) by mouth daily.   furosemide 20 MG tablet Commonly known as: LASIX TAKE 1 TABLET BY MOUTH TWICE DAILY AS NEEDED FOR 3 POUND WEIGHT GAIN IN ONE DAY OR 5 POUNDS IN ONE WEEK.Take 2 tablet daily for 3 days then resume as needed.   gabapentin 600 MG tablet Commonly known as: NEURONTIN TAKE 1 TABLET(600 MG) BY MOUTH THREE TIMES DAILY   HumaLOG KwikPen 200 UNIT/ML KwikPen Generic drug: insulin lispro Max daily 80 units   ICY HOT MEDICATED SPRAY EX Apply 1 application topically as needed (shoulder pain).   ipratropium-albuterol 0.5-2.5 (3) MG/3ML Soln Commonly known as: DUONEB Take 3 mLs by nebulization every 6 (six) hours as needed.   ketoconazole 2 % cream Commonly known as: NIZORAL Apply 1 application topically 2 (two) times daily.   levothyroxine 112 MCG tablet Commonly known as: SYNTHROID Take 1 tablet (112 mcg total) by mouth daily.   lidocaine 5 % Commonly known as: LIDODERM PLACE 1 PATCH ONTO THE SKIN DAILY AS DIRECTED, REMOVE AND DISCARD PATCH WITHN 12 HOURS OR AS DIRECTED BY  MD  lisinopril 20 MG tablet Commonly known as: ZESTRIL TAKE 1 TABLET BY MOUTH EVERY DAY   loperamide 2 MG tablet Commonly known as: IMODIUM A-D Take 2 mg by mouth 4 (four) times daily as needed for diarrhea or loose stools.   Melatonin 10 MG Tabs Take 10 mg by mouth at bedtime as needed (sleep).   metoprolol tartrate 50 MG tablet Commonly known as: LOPRESSOR TAKE 1 TABLET BY MOUTH TWICE DAILY   mirabegron ER 50 MG Tb24 tablet Commonly known as: Myrbetriq Take 1 tablet (50 mg total) by mouth daily.   MULTIVITAMIN ADULT PO Take 1 tablet by mouth daily.   nystatin powder Commonly known as: nystatin APPLY TOPICALLY THREE TIMES DAILY AS DIRECTED FOR 14 DAYS and prn yeast infection   nystatin cream Commonly known as: MYCOSTATIN Apply 1 application topically 3 (three) times daily. To skin folds   nystatin powder Commonly known as: MYCOSTATIN/NYSTOP Apply 1 application topically 3 (three) times daily.   omega-3 acid ethyl esters 1 g capsule Commonly known as: LOVAZA TAKE ONE CAPSULE BY MOUTH EVERY DAY   oxyCODONE-acetaminophen 10-325 MG tablet Commonly known as: Percocet Take 1 tablet by mouth every 6 (six) hours as needed for pain. Do Not Fill Before 02/14/2022   polyvinyl alcohol 1.4 % ophthalmic solution Commonly known as: LIQUIFILM TEARS Place 1 drop into both eyes 4 (four) times daily as needed (for dry/irritated eyes).   potassium chloride 10 MEQ CR capsule Commonly known as: MICRO-K Take 2 capsules (20 mEq total) by mouth 2 (two) times daily as needed (when taking a dose of lasix.).   saccharomyces boulardii 250 MG capsule Commonly known as: FLORASTOR Take 1 capsule (250 mg total) by mouth 2 (two) times daily. For 10 days.   sodium chloride 0.65 % nasal spray Commonly known as: OCEAN Place 2 sprays into the nose as needed for congestion.   tiotropium 18 MCG inhalation capsule Commonly known as: SPIRIVA Place 18 mcg into inhaler and inhale as needed.    tiZANidine 2 MG tablet Commonly known as: ZANAFLEX TAKE 1 TABLET(2 MG) BY MOUTH THREE TIMES DAILY   Toujeo SoloStar 300 UNIT/ML Solostar Pen Generic drug: insulin glargine (1 Unit Dial) Inject 90 Units into the skin daily in the afternoon.   triamterene-hydrochlorothiazide 37.5-25 MG tablet Commonly known as: MAXZIDE-25 TAKE 1 TABLET BY MOUTH DAILY   venlafaxine XR 75 MG 24 hr capsule Commonly known as: EFFEXOR-XR TAKE 1 CAPSULE BY MOUTH EVERY DAY WITH BREAKFAST   Victoza 18 MG/3ML Sopn Generic drug: liraglutide Administer 1.'8mg'$  under the skin daily for blood sugar.   Xeljanz 5 MG Tabs Generic drug: Tofacitinib Citrate TAKE 1 TABLET BY MOUTH DAILY         OBJECTIVE:   Vital Signs: BP 120/70 (BP Location: Left Arm, Patient Position: Sitting, Cuff Size: Large)   Pulse 73   Ht '5\' 6"'$  (1.676 m)   Wt 234 lb 6.4 oz (106.3 kg)   SpO2 96%   BMI 37.83 kg/m   Wt Readings from Last 3 Encounters:  03/02/22 234 lb 6.4 oz (106.3 kg)  02/01/22 235 lb 9.6 oz (106.9 kg)  12/14/21 220 lb 12.8 oz (100.2 kg)     Exam: General: Pt appears well and is in NAD  Lungs: Clear with good BS bilat with no rales, rhonchi, or wheezes  Heart: RRR with normal S1 and S2 and no gallops; no murmurs; no rub  Abdomen: Normoactive bowel sounds, soft, nontender, without masses or organomegaly palpable  Extremities: No  pretibial edema. No tremor. Normal strength and motion throughout. See detailed diabetic foot exam below.  Neuro: MS is good with appropriate affect, pt is alert and Ox3    DM foot exam:04/29/2021     The skin of the feet is without sores or ulcerations, toe nailed thickened The pedal pulses are 1+ on right and 1+ on left. The sensation is decreased  to a screening 5.07, 10 gram monofilament on the right     DATA REVIEWED:  Lab Results  Component Value Date   HGBA1C 5.9 (A) 09/02/2021   HGBA1C 5.9 (A) 04/29/2021   HGBA1C 7.7 (H) 02/12/2021    Latest Reference Range & Units  09/02/21 10:39  Sodium 135 - 145 mEq/L 140  Potassium 3.5 - 5.1 mEq/L 4.1  Chloride 96 - 112 mEq/L 103  CO2 19 - 32 mEq/L 28  Glucose 70 - 99 mg/dL 57 (L)  BUN 6 - 23 mg/dL 18  Creatinine 0.40 - 1.20 mg/dL 1.02  Calcium 8.4 - 10.5 mg/dL 9.1  GFR >60.00 mL/min 51.89 (L)    Latest Reference Range & Units 09/02/21 10:39  MICROALB/CREAT RATIO 0.0 - 30.0 mg/g 5.6    Latest Reference Range & Units 03/02/22 11:10  TSH 0.35 - 5.50 uIU/mL 0.24 (L)    ASSESSMENT / PLAN / RECOMMENDATIONS:   1) Type 2 Diabetes Mellitus, With  Neuropathic complications - Most recent A1c of 6.4 %. Goal A1c < 7.5 %.    - A1c optimal with occasional fasting hypoglycemia  - She initially told she continues to take toujeo 100 units but after I reminded her that I tried to reduce her basal dose for the past 2 visit, she stated that she is taking 90 units total a day but at times reverts to the 100 units  - She also has been taking less prandial insulin than previously prescribed  - She tells me she takes between 1-4 if her BG < 170 mg/dL  - We discussed the importance of following instructions and being on the same page  - Intolerant to SGLT-2 inhibitors due to severe genital irritations  - Will switch vixtoza to ozempic   MEDICATIONS:  - STOP Victoza - Start Ozempic 1 mg weekly  - Change  Toujeo 45 units QAM and 40 units  QPM - Change Humalog 6 units with each meal  - CF : Humalog ( BG - 140/30 )    EDUCATION / INSTRUCTIONS: BG monitoring instructions: Patient is instructed to check her blood sugars 3 times a day, before meals . Call [facility] if: BG persistently < 70  I reviewed the Rule of 15 for the treatment of hypoglycemia in detail with the patient. Literature supplied.   2) Diabetic complications:  Eye: Does not have known diabetic retinopathy.  Neuro/ Feet: Does  have known diabetic peripheral neuropathy .  Renal: Patient does not have known baseline CKD.    3)  Hypothyroidism:    -TSH today is low, will reduce levothyroxine as below   Medication Stop levothyroxine 112 mcg daily  Start levothyroxine 100 mcg daily    F/U in 6 months     Signed electronically by: Mack Guise, MD  [facility]  [facility] [address] [place], [place] [address] Phone: [phone] FAX: [phone]   CC: Sandrea Hughs, NP [place] [address] Phone: [phone]  Fax: [phone]  Return to Endocrinology clinic as below: Future Appointments  Date Time  Provider Barry  03/02/2022 10:30 AM Sherrell Weir, Melanie Crazier, MD [facility] None  03/03/2022  8:20 AM Ofilia Neas, PA-C [facility] None  03/14/2022  3:20 PM Raulkar, Clide Deutscher, MD [facility] CPR  02/27/2023 10:40 AM Ngetich, Nelda Bucks, NP [facility] None

## 2022-03-02 NOTE — Patient Instructions (Addendum)
-  STOP Victoza - Start Ozempic 1 mg once weekly  - Change  Toujeo 45  units every morning and 40 units every evening  - Change Humalog 6 units with each meal  - Humalog correctional insulin: ADD extra units on insulin to your meal-time humalog dose if your blood sugars are higher than 170. Use the scale below to help guide you:   Blood sugar before meal Number of units to inject  Less than 170 0 unit  171 - 200 1 unit  201 - 230 2 units   231 - 260 3 units   261 - 290 4 units   291 - 320 5 units   321 - 350 6 units          HOW TO TREAT LOW BLOOD SUGARS (Blood sugar LESS THAN 70 MG/DL) Please follow the RULE OF 15 for the treatment of hypoglycemia treatment (when your (blood sugars are less than 70 mg/dL)   STEP 1: Take 15 grams of carbohydrates when your blood sugar is low, which includes:  3-4 GLUCOSE TABS  OR 3-4 OZ OF JUICE OR REGULAR SODA OR ONE TUBE OF GLUCOSE GEL    STEP 2: RECHECK blood sugar in 15 MINUTES STEP 3: If your blood sugar is still low at the 15 minute recheck --> then, go back to STEP 1 and treat AGAIN with another 15 grams of carbohydrates.

## 2022-03-03 ENCOUNTER — Ambulatory Visit (INDEPENDENT_AMBULATORY_CARE_PROVIDER_SITE_OTHER): Payer: Medicare Other

## 2022-03-03 ENCOUNTER — Telehealth: Payer: Self-pay | Admitting: Internal Medicine

## 2022-03-03 ENCOUNTER — Encounter: Payer: Self-pay | Admitting: Physician Assistant

## 2022-03-03 ENCOUNTER — Ambulatory Visit: Payer: Medicare Other | Admitting: Physician Assistant

## 2022-03-03 ENCOUNTER — Other Ambulatory Visit: Payer: Self-pay

## 2022-03-03 VITALS — BP 136/63 | HR 71 | Resp 17 | Ht 66.0 in | Wt 234.0 lb

## 2022-03-03 DIAGNOSIS — Z8679 Personal history of other diseases of the circulatory system: Secondary | ICD-10-CM

## 2022-03-03 DIAGNOSIS — G4709 Other insomnia: Secondary | ICD-10-CM | POA: Diagnosis not present

## 2022-03-03 DIAGNOSIS — M79672 Pain in left foot: Secondary | ICD-10-CM | POA: Diagnosis not present

## 2022-03-03 DIAGNOSIS — Z8709 Personal history of other diseases of the respiratory system: Secondary | ICD-10-CM

## 2022-03-03 DIAGNOSIS — Z79899 Other long term (current) drug therapy: Secondary | ICD-10-CM | POA: Diagnosis not present

## 2022-03-03 DIAGNOSIS — M0579 Rheumatoid arthritis with rheumatoid factor of multiple sites without organ or systems involvement: Secondary | ICD-10-CM

## 2022-03-03 DIAGNOSIS — M797 Fibromyalgia: Secondary | ICD-10-CM | POA: Diagnosis not present

## 2022-03-03 DIAGNOSIS — Z8669 Personal history of other diseases of the nervous system and sense organs: Secondary | ICD-10-CM | POA: Diagnosis not present

## 2022-03-03 DIAGNOSIS — M3501 Sicca syndrome with keratoconjunctivitis: Secondary | ICD-10-CM | POA: Diagnosis not present

## 2022-03-03 DIAGNOSIS — B029 Zoster without complications: Secondary | ICD-10-CM

## 2022-03-03 DIAGNOSIS — M79671 Pain in right foot: Secondary | ICD-10-CM

## 2022-03-03 DIAGNOSIS — M810 Age-related osteoporosis without current pathological fracture: Secondary | ICD-10-CM

## 2022-03-03 DIAGNOSIS — M79642 Pain in left hand: Secondary | ICD-10-CM | POA: Diagnosis not present

## 2022-03-03 DIAGNOSIS — M79641 Pain in right hand: Secondary | ICD-10-CM

## 2022-03-03 DIAGNOSIS — M170 Bilateral primary osteoarthritis of knee: Secondary | ICD-10-CM

## 2022-03-03 DIAGNOSIS — M5136 Other intervertebral disc degeneration, lumbar region: Secondary | ICD-10-CM | POA: Diagnosis not present

## 2022-03-03 DIAGNOSIS — Z8719 Personal history of other diseases of the digestive system: Secondary | ICD-10-CM

## 2022-03-03 DIAGNOSIS — Z13220 Encounter for screening for lipoid disorders: Secondary | ICD-10-CM

## 2022-03-03 DIAGNOSIS — Z8659 Personal history of other mental and behavioral disorders: Secondary | ICD-10-CM

## 2022-03-03 DIAGNOSIS — M51369 Other intervertebral disc degeneration, lumbar region without mention of lumbar back pain or lower extremity pain: Secondary | ICD-10-CM

## 2022-03-03 DIAGNOSIS — Z8639 Personal history of other endocrine, nutritional and metabolic disease: Secondary | ICD-10-CM

## 2022-03-03 MED ORDER — LEVOTHYROXINE SODIUM 100 MCG PO TABS: 100.0000 ug | ORAL_TABLET | Freq: Every day | ORAL | 3 refills | Status: DC

## 2022-03-03 MED ORDER — XELJANZ 5 MG PO TABS: 1.0000 | ORAL_TABLET | Freq: Every day | ORAL | 0 refills | Status: DC

## 2022-03-03 NOTE — Telephone Encounter (Signed)
Attempted to contact patient no answer °

## 2022-03-03 NOTE — Telephone Encounter (Signed)
Please let the patient know that her thyroid function shows that she is on too much thyroid hormone.  This is not good for her heart and we need to reduce the dose as below    Stop levothyroxine 112 mcg Start levothyroxine 100 mcg      Thanks

## 2022-03-03 NOTE — Progress Notes (Signed)
CBC stable

## 2022-03-03 NOTE — Progress Notes (Signed)
X-rays of both feet are consistent with rheumatoid arthritis and osteoarthritis overlap.   Cystic changes in metatarsal heads noted.

## 2022-03-03 NOTE — Progress Notes (Signed)
X-rays of both hands are consistent with rheumatoid arthritis and osteoarthritis overlap. Cystic versus erosive changes noted in right carpal bones.   Please notify the patient.

## 2022-03-03 NOTE — Progress Notes (Unsigned)
Please review and send xeljanz refill. Thanks!

## 2022-03-04 LAB — COMPLETE METABOLIC PANEL WITH GFR
AG Ratio: 1.6 (calc) (ref 1.0–2.5)
ALT: 10 U/L (ref 6–29)
AST: 17 U/L (ref 10–35)
Albumin: 4.2 g/dL (ref 3.6–5.1)
Alkaline phosphatase (APISO): 88 U/L (ref 37–153)
BUN/Creatinine Ratio: 16 (calc) (ref 6–22)
BUN: 16 mg/dL (ref 7–25)
CO2: 29 mmol/L (ref 20–32)
Calcium: 9.6 mg/dL (ref 8.6–10.4)
Chloride: 102 mmol/L (ref 98–110)
Creat: 1.03 mg/dL — ABNORMAL HIGH (ref 0.60–0.95)
Globulin: 2.7 g/dL (calc) (ref 1.9–3.7)
Glucose, Bld: 41 mg/dL — ABNORMAL LOW (ref 65–99)
Potassium: 3.7 mmol/L (ref 3.5–5.3)
Sodium: 143 mmol/L (ref 135–146)
Total Bilirubin: 0.4 mg/dL (ref 0.2–1.2)
Total Protein: 6.9 g/dL (ref 6.1–8.1)
eGFR: 55 mL/min/{1.73_m2} — ABNORMAL LOW (ref >=60)

## 2022-03-04 LAB — CBC WITH DIFFERENTIAL/PLATELET
Absolute Monocytes: 1386 cells/uL — ABNORMAL HIGH (ref 200–950)
Basophils Absolute: 106 cells/uL (ref 0–200)
Basophils Relative: 0.8 %
Eosinophils Absolute: 594 cells/uL — ABNORMAL HIGH (ref 15–500)
Eosinophils Relative: 4.5 %
HCT: 43.7 % (ref 35.0–45.0)
Hemoglobin: 14.2 g/dL (ref 11.7–15.5)
Lymphs Abs: 3287 cells/uL (ref 850–3900)
MCH: 27.5 pg (ref 27.0–33.0)
MCHC: 32.5 g/dL (ref 32.0–36.0)
MCV: 84.7 fL (ref 80.0–100.0)
MPV: 10.5 fL (ref 7.5–12.5)
Monocytes Relative: 10.5 %
Neutro Abs: 7828 cells/uL — ABNORMAL HIGH (ref 1500–7800)
Neutrophils Relative %: 59.3 %
Platelets: 338 10*3/uL (ref 140–400)
RBC: 5.16 10*6/uL — ABNORMAL HIGH (ref 3.80–5.10)
RDW: 13.3 % (ref 11.0–15.0)
Total Lymphocyte: 24.9 %
WBC: 13.2 10*3/uL — ABNORMAL HIGH (ref 3.8–10.8)

## 2022-03-04 NOTE — Telephone Encounter (Signed)
LMTCB

## 2022-03-04 NOTE — Progress Notes (Signed)
Creatinine is borderline elevated but has improved.  GFR is borderline low-55.  Rest of CMP WNL.

## 2022-03-09 ENCOUNTER — Other Ambulatory Visit: Payer: Self-pay | Admitting: Family

## 2022-03-09 ENCOUNTER — Encounter: Payer: Self-pay | Admitting: Internal Medicine

## 2022-03-09 DIAGNOSIS — R600 Localized edema: Secondary | ICD-10-CM

## 2022-03-09 NOTE — Telephone Encounter (Signed)
LMTCB

## 2022-03-09 NOTE — Progress Notes (Signed)
Subjective:    Patient ID: Katherine Delgado, female    DOB: 04-May-1941, 81 y.o.   MRN: [id number]  HPI: Katherine Delgado is a 81 y.o. female who returns for follow-up for chronic pain and medication refill. She states her pain is located in her bilateral shoulders, lower back pain radiating into his left hip. Her current exercise regime is receiving PT/OT two days a week.  1) RUE psoriatic arthritis Katherine Delgado Morphine equivalent is 60.00  MME.  -misdiagnosed as shingles initially.  -The pain has gotten better but it still can be sever and feels like someone beat her with a hammer.  -Pain radiates up into the elbow and shoulder.  -The pain can be excruciating.  -She asks if we can add something else.  -no benefit from Qutenza.  -pain is currently worst in her elbow -radiates all the way into her hand  2) Pneumonia She was hospitalized with pneumonia since I saw her last. She was also told she had shingles, and she is toward the end of it now. Her fingers still have decreased strength on her right side- she does note improvements. She cannot get her right thumb to elevated. She will be seeing Dr. Estanislado Pandy. She has been unable to write.   3) Incontinence She has been using cranberry for her incontinence.   4) Failed back syndrome -due for refill of Percocet early next month -she hates to think that she will be in pain for the rest of her years -it continues to provide her relief -provided with pain journal last visit -her son accompanies her today -she did obtain relief from the lidocaine patch before -combination of tylenol and Percocet is working for her  5) Insomnia -not sleeping at night -she is taking the sleeping medication I prescribed -she feels if she can get some sleep that is great -we increased the Amitriptyline to '50mg'$  at once point -she know that sleep is a major factor.     Pain Inventory Average Pain 8 Pain Right Now 9 My pain is sharp, burning, stabbing, and  aching  In the last 24 hours, has pain interfered with the following? General activity 10 Relation with others 10 Enjoyment of life 10 What TIME of day is your pain at its worst? morning , daytime, and night Sleep (in general) Poor  Pain is worse with: walking, bending, sitting, standing, and some activites Pain improves with: rest, therapy/exercise, and medication Relief from Meds: 6  Family History  Problem Relation Age of Onset   Alzheimer's disease Mother    Heart disease Mother    Heart disease Father    Liver disease Father    Cancer Brother    Arthritis Son    Colon polyps Brother    Colon cancer Neg Hx    Esophageal cancer Neg Hx    Kidney disease Neg Hx    Stomach cancer Neg Hx    Rectal cancer Neg Hx    Social History   Socioeconomic History   Marital status: Widowed    Spouse name: Not on file   Number of children: 2   Years of education: 14   Highest education level: Not on file  Occupational History   Occupation: Retired  Tobacco Use   Smoking status: Former    Packs/day: 0.10    Years: 10.00    Total pack years: 1.00    Types: Cigarettes    Quit date: 12/07/1979    Years since quitting: 96.2  Passive exposure: Current   Smokeless tobacco: Never  Vaping Use   Vaping Use: Never used  Substance and Sexual Activity   Alcohol use: No    Alcohol/week: 0.0 standard drinks of alcohol   Drug use: No   Sexual activity: Not Currently  Other Topics Concern   Not on file  Social History Narrative   Walks with cane   Right handed    Caffeine use: Coffee (2 cups every morning)   Tea: sometimes   Soda: none   Social Determinants of Health   Financial Resource Strain: Medium Risk (10/05/2017)   Overall Financial Resource Strain (CARDIA)    Difficulty of Paying Living Expenses: Somewhat hard  Food Insecurity: Food Insecurity Present (10/05/2017)   Hunger Vital Sign    Worried About Running Out of Food in the Last Year: Sometimes true    Ran Out of  Food in the Last Year: Sometimes true  Transportation Needs: No Transportation Needs (10/05/2017)   PRAPARE - Hydrologist (Medical): No    Lack of Transportation (Non-Medical): No  Physical Activity: Inactive (10/05/2017)   Exercise Vital Sign    Days of Exercise per Week: 0 days    Minutes of Exercise per Session: 0 min  Stress: Stress Concern Present (10/05/2017)   [place]    Feeling of Stress : Rather much  Social Connections: Moderately Isolated (10/05/2017)   Social Connection and Isolation Panel [NHANES]    Frequency of Communication with Friends and Family: More than three times a week    Frequency of Social Gatherings with Friends and Family: More than three times a week    Attends Religious Services: Never    Marine scientist or Organizations: No    Attends Archivist Meetings: Never    Marital Status: Widowed   Past Surgical History:  Procedure Laterality Date   ABDOMINAL HYSTERECTOMY  1974   abdominal tumor  2002   APPENDECTOMY     APPLICATION OF A-CELL OF BACK N/A 09/26/2018   Procedure: With Acell;  Surgeon: Wallace Going, DO;  Location: [place];  Service: Plastics;  Laterality: N/A;   APPLICATION OF WOUND VAC N/A 09/26/2018   Procedure: Vac placement;  Surgeon: Wallace Going, DO;  Location: [place];  Service: Plastics;  Laterality: N/A;   BACK SURGERY  1982   BRONCHOSCOPY  2001   CATARACT EXTRACTION, BILATERAL  09/2019, 10/2019   CHOLECYSTECTOMY  1984   INCISION AND DRAINAGE OF WOUND N/A 09/26/2018   Procedure: Debridement of spine wound;  Surgeon: Wallace Going, DO;  Location: [place];  Service: Plastics;  Laterality: N/A;   KNEE SURGERY Bilateral 08/27/2010 (L) and 01/11/2011 (R)   LUMBAR LAMINECTOMY/DECOMPRESSION MICRODISCECTOMY N/A 07/03/2018   Procedure: Lumbar Three to Lumbar Five Laminectomy;  Surgeon: Judith Part, MD;  Location: [place];  Service: Neurosurgery;  Laterality: N/A;   LUMBAR WOUND DEBRIDEMENT N/A 08/20/2018   Procedure: POSTERIOR LUMBAR SPINAL WOUND DEBRIDEMENT AND REVISION;  Surgeon: Judith Part, MD;  Location: [place];  Service: Neurosurgery;  Laterality: N/A;  POSTERIOR LUMBAR SPINAL WOUND REVISION   miscarrage  1962   OVARIAN CYST SURGERY  1968   thymus tumor     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   TONSILLECTOMY     Past Surgical History:  Procedure Laterality Date   ABDOMINAL HYSTERECTOMY  1974   abdominal tumor  2002   APPENDECTOMY  APPLICATION OF A-CELL OF BACK N/A 09/26/2018   Procedure: With Acell;  Surgeon: Wallace Going, DO;  Location: [place];  Service: Plastics;  Laterality: N/A;   APPLICATION OF WOUND VAC N/A 09/26/2018   Procedure: Vac placement;  Surgeon: Wallace Going, DO;  Location: [place];  Service: Plastics;  Laterality: N/A;   BACK SURGERY  1982   BRONCHOSCOPY  2001   CATARACT EXTRACTION, BILATERAL  09/2019, 10/2019   CHOLECYSTECTOMY  1984   INCISION AND DRAINAGE OF WOUND N/A 09/26/2018   Procedure: Debridement of spine wound;  Surgeon: Wallace Going, DO;  Location: [place];  Service: Plastics;  Laterality: N/A;   KNEE SURGERY Bilateral 08/27/2010 (L) and 01/11/2011 (R)   LUMBAR LAMINECTOMY/DECOMPRESSION MICRODISCECTOMY N/A 07/03/2018   Procedure: Lumbar Three to Lumbar Five Laminectomy;  Surgeon: Judith Part, MD;  Location: [place];  Service: Neurosurgery;  Laterality: N/A;   LUMBAR WOUND DEBRIDEMENT N/A 08/20/2018   Procedure: POSTERIOR LUMBAR SPINAL WOUND DEBRIDEMENT AND REVISION;  Surgeon: Judith Part, MD;  Location: [place];  Service: Neurosurgery;  Laterality: N/A;  POSTERIOR LUMBAR SPINAL WOUND REVISION   miscarrage  1962   OVARIAN CYST SURGERY  1968   thymus tumor     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   TONSILLECTOMY     Past Medical History:  Diagnosis Date   Abnormality of gait 04/19/2016   Acute infective polyneuritis ([place]) 2002   Allergic rhinitis due to  pollen    Chronic pain syndrome    COPD (chronic obstructive pulmonary disease) (HCC)    chronic bronchitis   Depressive disorder, not elsewhere classified    Diabetes mellitus without complication ([place])    Diaphragmatic hernia without mention of obstruction or gangrene    Dyslipidemia    Fibromyalgia    GERD (gastroesophageal reflux disease)    with h/o esophagitis   Guillain-Barre syndrome (HCC)    History of benign thymus tumor    Insomnia, unspecified    Lumbar spinal stenosis 03/30/2018   L4-5 level, severe   Miscarriage 1962   Mixed hyperlipidemia    Morbid obesity ([place])    Osteoporosis    Pneumonia    Polyneuropathy in diabetes(357.2)    Rheumatoid arthritis(714.0)    Spondylosis, lumbosacral    Spontaneous ecchymoses    Type II or unspecified type diabetes mellitus with peripheral circulatory disorders, uncontrolled(250.72)    Unspecified chronic bronchitis (HCC)    Unspecified essential hypertension    Unspecified hypothyroidism    Unspecified pruritic disorder    Unspecified urinary incontinence    BP (!) 154/65   Pulse 87   SpO2 95%   Opioid Risk Score:   Fall Risk Score:  `1  Depression screen [facility] 2/9     02/17/2022   11:22 AM 01/11/2022    9:09 AM 12/14/2021    9:15 AM 08/02/2021   10:15 AM 06/29/2021   10:03 AM 06/01/2021   10:00 AM 04/27/2021   10:47 AM  Depression screen PHQ 2/9  Decreased Interest 0 0 0 1 0 1 0  Down, Depressed, Hopeless 0 0 0 1 0 1 0  PHQ - 2 Score 0 0 0 2 0 2 0       Review of Systems  Constitutional: Negative.   HENT: Negative.    Eyes: Negative.   Respiratory: Negative.    Cardiovascular: Negative.   Gastrointestinal: Negative.   Endocrine: Negative.   Genitourinary: Negative.   Musculoskeletal:  Positive for back pain, gait problem and  neck pain.       Pain in noth legs, pain  in right shoulder , pain in both knees , pain in both feet  Skin: Negative.   Allergic/Immunologic: Negative.   Hematological: Negative.    Psychiatric/Behavioral: Negative.        Objective:   Physical Exam Gen: no distress, normal appearing, BMI 35.64, BP 154/65 HEENT: oral mucosa pink and moist, hard of hearing Cardio: Reg rate Chest: normal effort, normal rate of breathing Abd: soft, non-distended Ext: no edema Psych: pleasant, normal affect Skin: intact Musculoskeletal:     Cervical back: Normal range of motion and neck supple.     Comments: Normal Muscle Bulk and Muscle Testing Reveals:  Upper Extremities: Full ROM and Muscle Strength 5/5 Bilateral AC Joint Tenderness Lumbar Paraspinal Tenderness: L-3-L-5 Left Greater Trochanter Tenderness Lower Extremities: Full ROM and Muscle Strength 5/5 Arrived in wheelchair +allodynia right arm Decreased sensation right hand +weakness in right hand   Skin:    General: Skin is warm and dry. Small openings along arm- appear to be scratch marks +bilateral lower extremity swelling Neurological:     Mental Status: She is alert and oriented to person, place, and time.  Psychiatric:        Mood and Affect: Mood normal.        Behavior: Behavior normal.        Assessment & Plan:  Failed Back Syndrome: Coninue HEP as Tolerated. Continue to Monitor. She is receiving Home Health therapy at this time, PT/OT two days a week. 03/11/2021. Prescribed lidocaine patch.  -Discussed current symptoms of pain and history of pain.  -Discussed benefits of exercise in reducing pain. -Discussed following foods that may reduce pain: 1) Ginger (especially studied for arthritis)- reduce leukotriene production to decrease inflammation 2) Blueberries- high in phytonutrients that decrease inflammation 3) Salmon- marine omega-3s reduce joint swelling and pain 4) Pumpkin seeds- reduce inflammation 5) dark chocolate- reduces inflammation 6) turmeric- reduces inflammation 7) tart cherries - reduce pain and stiffness 8) extra virgin olive oil - its compound olecanthal helps to block  prostaglandins  9) chili peppers- can be eaten or applied topically via capsaicin 10) mint- helpful for headache, muscle aches, joint pain, and itching 11) garlic- reduces inflammation  Link to further information on diet for chronic pain: http://www.randall.com/  Lumbar Radiculitis: Continue Gabapentin. Continue to monitor. Continue HEP as Tolerated.03/11/2021 Left  Greater Trochanter Bursitis: Scheduled for left hip injection with Dr Ranell Patrick.Continue to Alternate Ice and Heat Therapy. Continue current medication regimen. Continue to monitor. 03/11/2021 Bilateral Primary OA: Continue HEP as Tolerated. Continue to Monitor. 03/11/2021 Insomnia: Continue Amitriptyline to '50mg'$ . Continue to Monitor.03/11/2021. Discussed trying tart cherry juice  -Try to go outside near sunrise -Get exercise during the day.  -Turn off all devices an hour before bedtime.  -Teas that can benefit: chamomile, valerian root, Brahmi (Bacopa) -Can consider over the counter melatonin, magnesium, and/or L-theanine. Melatonin is an anti-oxidant with multiple health benefits. Magnesium is involved in greater than 300 enzymatic reactions in the body and most of [place] are deficient as our soil is often depleted. There are 7 different types of magnesium- Bioptemizer's is a supplement with all 7 types, and each has unique benefits. Magnesium can also help with constipation and anxiety.  -Pistachios naturally increase the production of melatonin -Cozy Earth bamboo bed sheets are free from toxic chemicals.  -Tart cherry juice or a tart cherry supplement can improve sleep and soreness post-workout     Chronic Pain Syndrome:  Refilled Oxycodone 10/325 mg one tablet every 6 hours as needed for pain #120. We will continue the opioid monitoring program, this consists of regular clinic visits, examinations, urine drug screen, pill counts as well as use of [place]  Controlled Substance Reporting system. Discussed the differences between oxycodone and oxycontin. Provided with a pain relief journal   -Discussed following foods that may reduce pain: 1) Ginger (especially studied for arthritis)- reduce leukotriene production to decrease inflammation 2) Blueberries- high in phytonutrients that decrease inflammation 3) Salmon- marine omega-3s reduce joint swelling and pain 4) Pumpkin seeds- reduce inflammation 5) dark chocolate- reduces inflammation 6) turmeric- reduces inflammation 7) tart cherries - reduce pain and stiffness 8) extra virgin olive oil - its compound olecanthal helps to block prostaglandins  9) chili peppers- can be eaten or applied topically via capsaicin 10) mint- helpful for headache, muscle aches, joint pain, and itching 11) garlic- reduces inflammation  Link to further information on diet for chronic pain: http://www.randall.com/   Right hand decreased strength post-shingles: provided referral for occupational therapy  8. Right sided excruciating post-shingles pain -will not try Qutenza again since did not help  -commended on eating anti-inflammatory foods.   -recommended doTerra Deep Blue Essential oil and applied to area of pain today- discussed that this is made of natural plant oils. Shared by personal experience of benefit from use of this essential oil -provided a link to a pdf of Pete Escogue's musculoskeletal alignment exercises: https://www.berger.biz/.pdf   -Provided with a pain relief journal and discussed that it contains foods and lifestyle tips to naturally help to improve pain. Discussed that these lifestyle strategies are also very good for health unlike some medications which can have negative side effects. Discussed that the act of keeping a journal can be therapeutic and helpful to realize patterns  what helps to trigger and alleviate pain.    9) Peripheral neuropathy: -Discussed Qutenza as an option for neuropathic pain control. Discussed that this is a capsaicin patch, stronger than capsaicin cream. Discussed that it is currently approved for diabetic peripheral neuropathy and post-herpetic neuralgia, but that it has also shown benefit in treating other forms of neuropathy. Provided patient with link to site to learn more about the patch: CinemaBonus.fr. Discussed that the patch would be placed in office and benefits usually last 3 months. Discussed that unintended exposure to capsaicin can cause severe irritation of eyes, mucous membranes, respiratory tract, and skin, but that Qutenza is a local treatment and does not have the systemic side effects of other nerve medications. Discussed that there may be pain, itching, erythema, and decreased sensory function associated with the application of Qutenza. Side effects usually subside within 1 week. A cold pack of analgesic medications can help with these side effects. Blood pressure can also be increased due to pain associated with administration of the patch.

## 2022-03-09 NOTE — Telephone Encounter (Signed)
Letter sent on 03/09/2022

## 2022-03-09 NOTE — Telephone Encounter (Signed)
Patient has request refill on medication Furosemide '20mg'$ . Patient medication last refilled 02/01/2022. Only 60 tablets were given. Refill sent to PCP [place], Nelda Bucks, NP for approval/additional refills.

## 2022-03-09 NOTE — Telephone Encounter (Signed)
I have attempted to contact patient 4 times by phone and once by my chart with no success. Can we send letter?

## 2022-03-14 ENCOUNTER — Encounter
Payer: Medicare Other | Attending: Physical Medicine and Rehabilitation | Admitting: Physical Medicine and Rehabilitation

## 2022-03-14 ENCOUNTER — Telehealth: Payer: Self-pay | Admitting: *Deleted

## 2022-03-14 VITALS — BP 154/65 | HR 87

## 2022-03-14 DIAGNOSIS — G6289 Other specified polyneuropathies: Secondary | ICD-10-CM

## 2022-03-14 DIAGNOSIS — R600 Localized edema: Secondary | ICD-10-CM

## 2022-03-14 DIAGNOSIS — G8929 Other chronic pain: Secondary | ICD-10-CM | POA: Diagnosis not present

## 2022-03-14 DIAGNOSIS — M25511 Pain in right shoulder: Secondary | ICD-10-CM

## 2022-03-14 DIAGNOSIS — M961 Postlaminectomy syndrome, not elsewhere classified: Secondary | ICD-10-CM | POA: Diagnosis not present

## 2022-03-14 DIAGNOSIS — B372 Candidiasis of skin and nail: Secondary | ICD-10-CM

## 2022-03-14 MED ORDER — OXYCODONE-ACETAMINOPHEN 10-325 MG PO TABS: 1.0000 | ORAL_TABLET | Freq: Four times a day (QID) | ORAL | 0 refills | Status: DC | PRN

## 2022-03-14 NOTE — Telephone Encounter (Signed)
Recommend use of InterDry moisture -wicking fabric under the breast and abdominal fold to prevent yeast infection. May order from online or call Telephone [phone]   - refill Diflucan 150 mg tablet take one by mouth x 1 dose then repeat in one week.

## 2022-03-14 NOTE — Telephone Encounter (Signed)
Patient called with concerns:  1.) Dealing with UTI and Yeast in Groin area and breast. Finished round of Antibiotics and Diflucan. This did help but did not cure and wants to know if she should have another round. Still have mild symptoms.   2.) Legs still swollen and requesting refill on Lasix.   Please Advise. Patient stated that she does not want to come into office, stated that you have already seen her for this.

## 2022-03-14 NOTE — Patient Instructions (Signed)
Foods that may reduce pain: 1) Ginger (especially studied for arthritis)- reduce leukotriene production to decrease inflammation 2) Blueberries- high in phytonutrients that decrease inflammation 3) Salmon- marine omega-3s reduce joint swelling and pain 4) Pumpkin seeds- reduce inflammation 5) dark chocolate- reduces inflammation 6) turmeric- reduces inflammation 7) tart cherries - reduce pain and stiffness 8) extra virgin olive oil - its compound olecanthal helps to block prostaglandins  9) chili peppers- can be eaten or applied topically via capsaicin 10) mint- helpful for headache, muscle aches, joint pain, and itching 11) garlic- reduces inflammation  Link to further information on diet for chronic pain: https://www.practicalpainmanagement.com/treatments/complementary/diet-patients-[email or web address]-pain    Insomnia: -Try to go outside near sunrise -Get exercise during the day.  -Turn off all devices an hour before bedtime.  -Teas that can benefit: chamomile, valerian root, Brahmi (Bacopa) -Can consider over the counter melatonin, magnesium, and/or L-theanine. Melatonin is an anti-oxidant with multiple health benefits. Magnesium is involved in greater than 300 enzymatic reactions in the body and most of us are deficient as our soil is often depleted. There are 7 different types of magnesium- Bioptemizer's is a supplement with all 7 types, and each has unique benefits. Magnesium can also help with constipation and anxiety.  -Pistachios naturally increase the production of melatonin -Cozy Earth bamboo bed sheets are free from toxic chemicals.  -Tart cherry juice or a tart cherry supplement can improve sleep and soreness post-workout   

## 2022-03-15 MED ORDER — FLUCONAZOLE 150 MG PO TABS
ORAL_TABLET | ORAL | 0 refills | Status: DC
Start: 2022-03-15 — End: 2022-06-28

## 2022-03-15 MED ORDER — FUROSEMIDE 20 MG PO TABS: ORAL_TABLET | ORAL | 3 refills | Status: DC

## 2022-03-15 NOTE — Telephone Encounter (Signed)
Patient notified and agreed.  Pended Rx's and sent to [facility] for approval.

## 2022-03-15 NOTE — Telephone Encounter (Signed)
Script send to pharmacy

## 2022-03-19 ENCOUNTER — Other Ambulatory Visit: Payer: Self-pay | Admitting: Family

## 2022-03-21 ENCOUNTER — Ambulatory Visit (INDEPENDENT_AMBULATORY_CARE_PROVIDER_SITE_OTHER): Payer: Medicare Other | Admitting: Adult Health

## 2022-03-21 ENCOUNTER — Ambulatory Visit
Admission: RE | Admit: 2022-03-21 | Discharge: 2022-03-21 | Disposition: A | Discharge status: Home / Self Care | Payer: Medicare Other | Source: Ambulatory Visit | Attending: Adult Health | Admitting: Adult Health

## 2022-03-21 ENCOUNTER — Other Ambulatory Visit: Payer: Self-pay | Admitting: *Deleted

## 2022-03-21 ENCOUNTER — Encounter: Payer: Self-pay | Admitting: Adult Health

## 2022-03-21 VITALS — BP 128/60 | HR 77 | Temp 97.2°F | Resp 18 | Ht 66.0 in | Wt 235.0 lb

## 2022-03-21 DIAGNOSIS — M79671 Pain in right foot: Secondary | ICD-10-CM | POA: Diagnosis not present

## 2022-03-21 DIAGNOSIS — R600 Localized edema: Secondary | ICD-10-CM | POA: Diagnosis not present

## 2022-03-21 DIAGNOSIS — S92354A Nondisplaced fracture of fifth metatarsal bone, right foot, initial encounter for closed fracture: Secondary | ICD-10-CM | POA: Diagnosis not present

## 2022-03-21 DIAGNOSIS — M7989 Other specified soft tissue disorders: Secondary | ICD-10-CM | POA: Diagnosis not present

## 2022-03-21 MED ORDER — VENLAFAXINE HCL ER 75 MG PO CP24: ORAL_CAPSULE | ORAL | 1 refills | Status: DC

## 2022-03-21 NOTE — Telephone Encounter (Signed)
Pharmacy requested refill.  Pended Rx and sent to [facility] for approval due to HIGH ALERT Warning.  

## 2022-03-21 NOTE — Patient Instructions (Signed)

## 2022-03-21 NOTE — Telephone Encounter (Signed)
Refilled

## 2022-03-21 NOTE — Progress Notes (Signed)
[facility]  Provider:   Durenda Age  DNP  Code Status:   Full code  Goals of Care:     02/17/2022   11:26 AM  Advanced Directives  Does Patient Have a Medical Advance Directive? Yes  Type of Advance Directive Out of facility DNR (pink MOST or yellow form)  Does patient want to make changes to medical advance directive? No - Patient declined     Chief Complaint  Patient presents with   Acute Visit    Patient is here for foot injury due to fall on saturday    HPI: Patient is a 81 y.o. female seen today for an acute visit for right foot pain. She was getting up from a sitting position, lost balance but was able to hold onto a chair and sofa. Right foot toes got caught up in a blanket and hit the floor and her right big toenail bled. She presented today in the office with erythematous and bruised right big toe. She has RLE 2+ and LLE 1+edema. She has a 9/10 pain on her right foot, mostly at night.   Past Medical History:  Diagnosis Date   Abnormality of gait 04/19/2016   Acute infective polyneuritis ([place]) 2002   Allergic rhinitis due to pollen    Chronic pain syndrome    COPD (chronic obstructive pulmonary disease) (HCC)    chronic bronchitis   Depressive disorder, not elsewhere classified    Diabetes mellitus without complication ([place])    Diaphragmatic hernia without mention of obstruction or gangrene    Dyslipidemia    Fibromyalgia    GERD (gastroesophageal reflux disease)    with h/o esophagitis   Guillain-Barre syndrome (HCC)    History of benign thymus tumor    Insomnia, unspecified    Lumbar spinal stenosis 03/30/2018   L4-5 level, severe   Miscarriage 1962   Mixed hyperlipidemia    Morbid obesity ([place])    Osteoporosis    Pneumonia    Polyneuropathy in diabetes(357.2)    Rheumatoid arthritis(714.0)    Spondylosis, lumbosacral    Spontaneous ecchymoses    Type II or unspecified type diabetes mellitus with peripheral circulatory disorders,  uncontrolled(250.72)    Unspecified chronic bronchitis (HCC)    Unspecified essential hypertension    Unspecified hypothyroidism    Unspecified pruritic disorder    Unspecified urinary incontinence     Past Surgical History:  Procedure Laterality Date   ABDOMINAL HYSTERECTOMY  1974   abdominal tumor  2002   APPENDECTOMY     APPLICATION OF A-CELL OF BACK N/A 09/26/2018   Procedure: With Acell;  Surgeon: Wallace Going, DO;  Location: [place];  Service: Plastics;  Laterality: N/A;   APPLICATION OF WOUND VAC N/A 09/26/2018   Procedure: Vac placement;  Surgeon: Wallace Going, DO;  Location: [place];  Service: Plastics;  Laterality: N/A;   BACK SURGERY  1982   BRONCHOSCOPY  2001   CATARACT EXTRACTION, BILATERAL  09/2019, 10/2019   CHOLECYSTECTOMY  1984   INCISION AND DRAINAGE OF WOUND N/A 09/26/2018   Procedure: Debridement of spine wound;  Surgeon: Wallace Going, DO;  Location: [place];  Service: Plastics;  Laterality: N/A;   KNEE SURGERY Bilateral 08/27/2010 (L) and 01/11/2011 (R)   LUMBAR LAMINECTOMY/DECOMPRESSION MICRODISCECTOMY N/A 07/03/2018   Procedure: Lumbar Three to Lumbar Five Laminectomy;  Surgeon: Judith Part, MD;  Location: [place];  Service: Neurosurgery;  Laterality: N/A;   LUMBAR WOUND DEBRIDEMENT N/A 08/20/2018   Procedure:  POSTERIOR LUMBAR SPINAL WOUND DEBRIDEMENT AND REVISION;  Surgeon: Judith Part, MD;  Location: [place];  Service: Neurosurgery;  Laterality: N/A;  POSTERIOR LUMBAR SPINAL WOUND REVISION   miscarrage  1962   OVARIAN CYST SURGERY  1968   thymus tumor     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   TONSILLECTOMY      Allergies  Allergen Reactions   Influenza Vaccines Other (See Comments)    "h/o Guillain Barre; dr's told me years ago never to take another flu shot as it could relapse Rosalee Kaufman; has to do with when vaccine being changed to H1N1 virus"   Penicillins Hives, Itching, Swelling and Rash    Has patient had a PCN reaction causing  immediate rash, facial/tongue/throat swelling, SOB or lightheadedness with hypotension:Unknown Has patient had a PCN reaction causing severe rash involving mucus membranes or skin necrosis: Unknown Has patient had a PCN reaction that required hospitalization:Unknown Has patient had a PCN reaction occurring within the last 10 years: Unknown If all of the above answers are "NO", then may proceed with Cephalosporin use.    Statins Other (See Comments)    Myopathy, transaminitis   Sulfamethoxazole-Trimethoprim Itching    Outpatient Encounter Medications as of 03/21/2022  Medication Sig   acetaminophen (TYLENOL) 325 MG tablet Take 650 mg by mouth every 6 (six) hours as needed (for pain.).   amitriptyline (ELAVIL) 50 MG tablet Take 1 tablet (50 mg total) by mouth at bedtime.   amLODipine (NORVASC) 10 MG tablet TAKE 1 TABLET(10 MG) BY MOUTH DAILY   aspirin EC 81 MG tablet Take 81 mg by mouth 2 (two) times a week.   augmented betamethasone dipropionate (DIPROLENE-AF) 0.05 % cream Apply topically 2 (two) times daily as needed.   calamine lotion Apply 1 application topically 3 (three) times daily. To affected areas on right fore arm   cholecalciferol (VITAMIN D3) 25 MCG (1000 UT) tablet Take 1,000 Units by mouth daily.   Cranberry 475 MG CAPS Take 1 capsule (475 mg total) by mouth 2 (two) times daily.   docusate sodium (COLACE) 100 MG capsule Take 100 mg by mouth daily as needed for mild constipation.   DULoxetine (CYMBALTA) 20 MG capsule TAKE 1 CAPSULE(20 MG) BY MOUTH DAILY   ezetimibe (ZETIA) 10 MG tablet Take 1 tablet (10 mg total) by mouth daily.   fluconazole (DIFLUCAN) 150 MG tablet Take one by mouth x 1 dose Then repeat in 1 week.   fluticasone (CUTIVATE) 0.05 % cream Apply topically 2 (two) times daily as needed.   folic acid (FOLVITE) 1 MG tablet Take 2 tablets (2 mg total) by mouth daily.   furosemide (LASIX) 20 MG tablet TAKE 1 BY MOUTH TWICE DAILY FOR 3 DAYS AS NEEDED FOR 3 POUND WEIGHT  GAIN IN ONE DAY OR 5 POUNDS IN ONE WEEK   gabapentin (NEURONTIN) 600 MG tablet TAKE 1 TABLET(600 MG) BY MOUTH THREE TIMES DAILY   insulin lispro (HUMALOG KWIKPEN) 200 UNIT/ML KwikPen Max daily 80 units   Insulin Pen Needle (B-D UF III MINI PEN NEEDLES) 31G X 5 MM MISC USE AS DIRECTED   ipratropium-albuterol (DUONEB) 0.5-2.5 (3) MG/3ML SOLN Take 3 mLs by nebulization every 6 (six) hours as needed.   ketoconazole (NIZORAL) 2 % cream Apply 1 application topically 2 (two) times daily.   levothyroxine (SYNTHROID) 100 MCG tablet Take 1 tablet (100 mcg total) by mouth daily.   lidocaine (LIDODERM) 5 % PLACE 1 PATCH ONTO THE SKIN DAILY AS DIRECTED, REMOVE AND  DISCARD PATCH WITHN 12 HOURS OR AS DIRECTED BY MD   lisinopril (ZESTRIL) 20 MG tablet TAKE 1 TABLET BY MOUTH EVERY DAY   loperamide (IMODIUM A-D) 2 MG tablet Take 2 mg by mouth 4 (four) times daily as needed for diarrhea or loose stools.   Melatonin 10 MG TABS Take 10 mg by mouth at bedtime as needed (sleep).   Menthol, Topical Analgesic, (ICY HOT MEDICATED SPRAY EX) Apply 1 application topically as needed (shoulder pain).   metoprolol tartrate (LOPRESSOR) 50 MG tablet TAKE 1 TABLET BY MOUTH TWICE DAILY   mirabegron ER (MYRBETRIQ) 50 MG TB24 tablet Take 1 tablet (50 mg total) by mouth daily.   Multiple Vitamins-Minerals (MULTIVITAMIN ADULT PO) Take 1 tablet by mouth daily.   nystatin (MYCOSTATIN/NYSTOP) powder Apply 1 application topically 3 (three) times daily.   nystatin (NYSTATIN) powder APPLY TOPICALLY THREE TIMES DAILY AS DIRECTED FOR 14 DAYS and prn yeast infection   nystatin cream (MYCOSTATIN) Apply 1 application topically 3 (three) times daily. To skin folds   omega-3 acid ethyl esters (LOVAZA) 1 g capsule TAKE ONE CAPSULE BY MOUTH EVERY DAY   oxyCODONE-acetaminophen (PERCOCET) 10-325 MG tablet Take 1 tablet by mouth every 6 (six) hours as needed for pain. Do Not Fill Before 03/16/2022   polyvinyl alcohol (LIQUIFILM TEARS) 1.4 % ophthalmic  solution Place 1 drop into both eyes 4 (four) times daily as needed (for dry/irritated eyes).    potassium chloride (MICRO-K) 10 MEQ CR capsule Take 2 capsules (20 mEq total) by mouth 2 (two) times daily as needed (when taking a dose of lasix.).   saccharomyces boulardii (FLORASTOR) 250 MG capsule Take 1 capsule (250 mg total) by mouth 2 (two) times daily. For 10 days.   Semaglutide, 1 MG/DOSE, 4 MG/3ML SOPN Inject 1 mg as directed once a week.   sodium chloride (OCEAN) 0.65 % nasal spray Place 2 sprays into the nose as needed for congestion.   tiotropium (SPIRIVA) 18 MCG inhalation capsule Place 18 mcg into inhaler and inhale as needed.   tiZANidine (ZANAFLEX) 2 MG tablet TAKE 1 TABLET(2 MG) BY MOUTH THREE TIMES DAILY   Tofacitinib Citrate (XELJANZ) 5 MG TABS Take 1 tablet (5 mg total) by mouth daily.   triamterene-hydrochlorothiazide (MAXZIDE-25) 37.5-25 MG tablet TAKE 1 TABLET BY MOUTH DAILY   venlafaxine XR (EFFEXOR-XR) 75 MG 24 hr capsule TAKE 1 CAPSULE BY MOUTH EVERY DAY WITH BREAKFAST   No facility-administered encounter medications on file as of 03/21/2022.    Review of Systems:  Review of Systems  Constitutional:  Negative for appetite change, chills, fatigue and fever.  HENT:  Negative for congestion, hearing loss, rhinorrhea and sore throat.   Eyes: Negative.   Respiratory:  Negative for cough, shortness of breath and wheezing.   Cardiovascular:  Negative for chest pain, palpitations and leg swelling.  Gastrointestinal:  Negative for abdominal pain, constipation, diarrhea, nausea and vomiting.  Genitourinary:  Negative for dysuria.  Musculoskeletal:  Negative for arthralgias, back pain and myalgias.       Right foot pain  Skin:  Negative for color change, rash and wound.  Neurological:  Negative for dizziness, weakness and headaches.  Psychiatric/Behavioral:  Negative for behavioral problems. The patient is not nervous/anxious.     Health Maintenance  Topic Date Due   Zoster  Vaccines- Shingrix (1 of 2) Never done   COVID-19 Vaccine (3 - Pfizer risk series) 12/28/2019   FOOT EXAM  10/15/2021   OPHTHALMOLOGY EXAM  03/25/2022   HEMOGLOBIN  A1C  09/02/2022   DEXA SCAN  09/20/2025   TETANUS/TDAP  05/28/2026   Pneumonia Vaccine 70+ Years old  Completed   HPV VACCINES  Aged Out   INFLUENZA VACCINE  Discontinued    Physical Exam: Vitals:   03/21/22 1007  BP: 128/60  Pulse: 77  Resp: 18  Temp: (!) 97.2 F (36.2 C)  TempSrc: Temporal  SpO2: 95%  Weight: 235 lb (106.6 kg)  Height: '5\' 6"'$  (1.676 m)   Body mass index is 37.93 kg/m. Physical Exam Constitutional:      Appearance: She is obese.  HENT:     Head: Normocephalic and atraumatic.     Nose: Nose normal.     Mouth/Throat:     Mouth: Mucous membranes are moist.  Eyes:     Conjunctiva/sclera: Conjunctivae normal.  Cardiovascular:     Rate and Rhythm: Normal rate and regular rhythm.  Pulmonary:     Effort: Pulmonary effort is normal.     Breath sounds: Normal breath sounds.  Abdominal:     General: Bowel sounds are normal.     Palpations: Abdomen is soft.  Musculoskeletal:        General: Swelling present. Normal range of motion.     Cervical back: Normal range of motion.     Comments: BLE  edema  RLE 2+ LLE 1+  Skin:    General: Skin is warm and dry.     Comments: Right toe erythematous and bruised  Neurological:     General: No focal deficit present.     Mental Status: She is alert and oriented to person, place, and time.  Psychiatric:        Mood and Affect: Mood normal.        Behavior: Behavior normal.        Thought Content: Thought content normal.        Judgment: Judgment normal.     Labs reviewed: Basic Metabolic Panel: Recent Labs    09/02/21 1039 09/30/21 1532 03/02/22 1110 03/03/22 0850  NA 140 138  --  143  K 4.1 4.0  --  3.7  CL 103 101  --  102  CO2 28 28  --  29  GLUCOSE 57* 105*  --  41*  BUN 18 27*  --  16  CREATININE 1.02 1.06*  --  1.03*  CALCIUM 9.1  9.4  --  9.6  TSH 0.44  --  0.24*  --    Liver Function Tests: Recent Labs    09/30/21 1532 03/03/22 0850  AST 12 17  ALT 6 10  BILITOT 0.3 0.4  PROT 7.0 6.9   No results for input(s): "LIPASE", "AMYLASE" in the last 8760 hours. No results for input(s): "AMMONIA" in the last 8760 hours. CBC: Recent Labs    03/31/21 0836 09/30/21 1532 03/03/22 0850  WBC 10.5 14.7* 13.2*  NEUTROABS 8.8* 10,790* 7,828*  HGB 14.2 13.6 14.2  HCT 44.3 42.4 43.7  MCV 84.5 82.3 84.7  PLT 258 363 338   Lipid Panel: No results for input(s): "CHOL", "HDL", "LDLCALC", "TRIG", "CHOLHDL", "LDLDIRECT" in the last 8760 hours. Lab Results  Component Value Date   HGBA1C 6.4 (A) 03/02/2022    Procedures since last visit: XR Foot 2 Views Left  Result Date: 03/03/2022 Juxta-articular osteopenia was noted.  Narrowing of first fourth and fifth MTP joints was noted.  PIP and DIP joint narrowing was noted.  First PIP effusion was noted.  Cystic changes was noted in  the metatarsal heads.  Dorsal spurring was noted.  Subtalar joint space narrowing was noted. Impression: These findings are consistent with rheumatoid arthritis and osteoarthritis overlap.  XR Foot 2 Views Right  Result Date: 03/03/2022 Juxta-articular osteopenia was noted.  Narrowing of PIP and DIP joints and first MTP joint was noted.  Cystic changes were noted in the metatarsal heads.  Metatarsal tarsal joint narrowing was noted.  No tibiotalar or subtalar joint space narrowing was noted. Impression: These findings are consistent with rheumatoid arthritis and osteoarthritis overlap.  XR Hand 2 View Left  Result Date: 03/03/2022 Severe juxta-articular osteopenia noted.  Severe CMC narrowing and subluxation was noted.  Narrowing of all MCP PIP and DIP joints was noted.  Severe intercarpal and radiocarpal joint space narrowing was noted.  Metacarpocarpal joint space narrowing was noted.  Postsurgical changes were noted in the ulnar styloid.  Cystic  versus erosive changes were noted in the carpal bones. Impression: These findings are consistent with rheumatoid arthritis and osteoarthritis overlap.  XR Hand 2 View Right  Result Date: 03/03/2022 Severe juxta-articular osteopenia was noted.  Severe narrowing of CMC joint and subluxation was noted.  Narrowing of all MCP joints with invagination of second MCP joint was noted.  PIP and DIP narrowing was noted.  Intercarpal and radiocarpal joint space narrowing was noted.  No erosive changes were noted. Impression: These findings are consistent with rheumatoid arthritis and osteoarthritis overlap.   Assessment/Plan  1. Right foot pain -  S/F failed fall but right foot hit the floor -   instructed to continue applying ice pack and Acetaminophen as needed for pain -  acre wrap right foot and to use hard-soled shoe when ambulating - DG Foot Complete Right; Future -  result showed transverse nondisplaced fracture, base of right fifth metatarsal -  will refer to orthopedics   2. Bilateral lower extremity edema -  continue Lasix  -  elevate lower extremities at night and when sitting down for a long time   3.  Nondisplaced fracture of fifth metatarsal bone, initial encounter -  ambulatory referral to orthopedics    Labs/tests ordered:  Right foot x-ray  Next appt:  as needed

## 2022-03-21 NOTE — Progress Notes (Signed)
Has Osseous demineralization with transverse nondisplaced fracture, base of RIGHT fifth metatarsal. -  referral to orthopedics sent

## 2022-03-22 ENCOUNTER — Encounter: Payer: Self-pay | Admitting: Orthopedic Surgery

## 2022-03-22 ENCOUNTER — Ambulatory Visit (INDEPENDENT_AMBULATORY_CARE_PROVIDER_SITE_OTHER): Payer: Medicare Other | Admitting: Orthopedic Surgery

## 2022-03-22 DIAGNOSIS — S92354A Nondisplaced fracture of fifth metatarsal bone, right foot, initial encounter for closed fracture: Secondary | ICD-10-CM | POA: Diagnosis not present

## 2022-03-22 NOTE — Progress Notes (Signed)
Office Visit Note   Patient: Katherine Delgado           Date of Birth: 01-27-1941           MRN: [id number] Visit Date: 03/22/2022              Requested by: Nickola Major, NP [address],  [place] [address] PCP: Sandrea Hughs, NP  Chief Complaint  Patient presents with   Right Foot - Fracture    Nondisplaced fracture of 5th MT      HPI: Patient is a 81 year old woman who is seen for initial evaluation for nondisplaced fracture base of the fifth metatarsal right foot.  Patient states she lost her balance getting up from a chair her toes got caught in a blanket and she hit the floor.  Assessment & Plan: Visit Diagnoses:  1. Nondisp fx of fifth metatarsal bone, right foot, init     Plan: We will place her in a postoperative shoe weightbearing as tolerated  Three-view radiographs of the right foot at follow-up.  Follow-Up Instructions: Return in about 4 weeks (around 04/19/2022).   Ortho Exam  Patient is alert, oriented, no adenopathy, well-dressed, normal affect, normal respiratory effort. Examination patient has bruising around the right great toe but has no tenderness to palpation radiographs show no fracture of the great toe.  Patient is tender to palpation of the base of the fifth metatarsal consistent with the nondisplaced fracture.  Radiographs do show osteoporosis.  Patient does have venous stasis insufficiency swelling but no venous ulcers.  Imaging: No results found. No images are attached to the encounter.  Labs: Lab Results  Component Value Date   HGBA1C 6.4 (A) 03/02/2022   HGBA1C 5.9 (A) 09/02/2021   HGBA1C 5.9 (A) 04/29/2021   ESRSEDRATE 29 (H) 01/25/2021   ESRSEDRATE 29 04/02/2019   ESRSEDRATE 24 (H) 08/17/2018   CRP 1.4 (H) 01/25/2021   CRP 2.9 (H) 08/17/2018   LABURIC 5.7 10/06/2016   REPTSTATUS 10/01/2018 FINAL 09/26/2018   GRAMSTAIN NO WBC SEEN NO ORGANISMS SEEN  09/26/2018   CULT  09/26/2018    RARE  DIPHTHEROIDS(CORYNEBACTERIUM SPECIES) Standardized susceptibility testing for this organism is not available. NO ANAEROBES ISOLATED Performed at [facility], [place] [address], [place], [place] [address]    LABORGA NO GROWTH 11/21/2016     Lab Results  Component Value Date   ALBUMIN 3.5 01/25/2021   ALBUMIN 2.6 (L) 08/22/2018   ALBUMIN 3.9 02/20/2017    Lab Results  Component Value Date   MG 2.0 09/06/2016   Lab Results  Component Value Date   VD25OH 30 12/27/2017   VD25OH 20 (L) 04/27/2017    No results found for: "PREALBUMIN"    Latest Ref Rng & Units 03/03/2022    8:50 AM 09/30/2021    3:32 PM 03/31/2021    8:36 AM  CBC EXTENDED  WBC 3.8 - 10.8 Thousand/uL 13.2  14.7  10.5   RBC 3.80 - 5.10 Million/uL 5.16  5.15  5.24   Hemoglobin 11.7 - 15.5 g/dL 14.2  13.6  14.2   HCT 35.0 - 45.0 % 43.7  42.4  44.3   Platelets 140 - 400 Thousand/uL 338  363  258   NEUT# 1,500 - 7,800 cells/uL 7,828  10,790  8.8   Lymph# 850 - 3,900 cells/uL 3,287  1,940  0.7      There is no height or weight on file to calculate BMI.  Orders:  No orders of the defined types were placed in this encounter.  No orders of the defined types were placed in this encounter.    Procedures: No procedures performed  Clinical Data: No additional findings.  ROS:  All other systems negative, except as noted in the HPI. Review of Systems  Objective: Vital Signs: There were no vitals taken for this visit.  Specialty Comments:  No specialty comments available.  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   Primary osteoarthritis of right knee 07/25/2019   Wound dehiscence 08/17/2018   Lumbar spinal stenosis 03/30/2018   Rheumatoid arthritis involving both wrists  with positive rheumatoid factor ([place]) 06/14/2016   High risk medication use 06/14/2016   Sjogren's syndrome ([place]) 06/14/2016   Primary osteoarthritis of both knees 06/14/2016   DDD (degenerative disc disease), lumbar 06/14/2016   Hypothyroidism 06/14/2016   Osteoporosis 06/14/2016   Abnormality of gait 04/19/2016   Type 2 diabetes mellitus with diabetic polyneuropathy, with long-term current use of insulin ([place]) 11/27/2015   Diabetes mellitus without complication ([place]) 15/40/0867   Headache(784.0) 07/31/2013   Sinus infection 07/31/2013   Chronic right-sided low back pain with right-sided sciatica 03/14/2013   Insomnia 12/06/2012   Nausea alone 12/06/2012   Diarrhea 12/06/2012   Urinary incontinence, urge 12/06/2012   Lumbosacral root lesions, not elsewhere classified 11/27/2012   Diabetic polyneuropathy associated with type 2 diabetes mellitus ([place]) 11/27/2012   EDEMA 05/04/2010   YEAST INFECTION 05/03/2010   Hyperlipidemia 05/03/2010   DEPRESSION 05/03/2010   History of peripheral neuropathy 05/03/2010   Essential hypertension 05/03/2010   ALLERGIC RHINITIS 05/03/2010   PNEUMONIA 05/03/2010   COPD (chronic obstructive pulmonary disease) ([place]) 05/03/2010   GERD 05/03/2010   Fibromyalgia 05/03/2010   DYSPNEA 05/03/2010   CHEST PAIN 05/03/2010   Past Medical History:  Diagnosis Date   Abnormality of gait 04/19/2016   Acute infective polyneuritis ([place]) 2002   Allergic rhinitis due to pollen    Chronic pain syndrome    COPD (chronic obstructive pulmonary disease) (HCC)    chronic bronchitis   Depressive disorder, not elsewhere classified    Diabetes mellitus without complication ([place])    Diaphragmatic hernia without mention of obstruction or gangrene    Dyslipidemia    Fibromyalgia    GERD (gastroesophageal reflux disease)    with h/o esophagitis   Guillain-Barre syndrome ([place])    History of benign thymus tumor    Insomnia, unspecified    Lumbar spinal stenosis 03/30/2018    L4-5 level, severe   Miscarriage 1962   Mixed hyperlipidemia    Morbid obesity ([place])    Osteoporosis    Pneumonia    Polyneuropathy in diabetes(357.2)    Rheumatoid arthritis(714.0)    Spondylosis, lumbosacral    Spontaneous ecchymoses    Type II or unspecified type diabetes mellitus with peripheral circulatory disorders, uncontrolled(250.72)    Unspecified chronic bronchitis (HCC)    Unspecified essential hypertension    Unspecified hypothyroidism    Unspecified pruritic disorder    Unspecified urinary incontinence     Family History  Problem Relation Age of Onset   Alzheimer's disease Mother    Heart disease Mother    Heart disease Father    Liver disease Father    Cancer Brother  Arthritis Son    Colon polyps Brother    Colon cancer Neg Hx    Esophageal cancer Neg Hx    Kidney disease Neg Hx    Stomach cancer Neg Hx    Rectal cancer Neg Hx     Past Surgical History:  Procedure Laterality Date   ABDOMINAL HYSTERECTOMY  1974   abdominal tumor  2002   APPENDECTOMY     APPLICATION OF A-CELL OF BACK N/A 09/26/2018   Procedure: With Acell;  Surgeon: Wallace Going, DO;  Location: [place];  Service: Plastics;  Laterality: N/A;   APPLICATION OF WOUND VAC N/A 09/26/2018   Procedure: Vac placement;  Surgeon: Wallace Going, DO;  Location: [place];  Service: Plastics;  Laterality: N/A;   BACK SURGERY  1982   BRONCHOSCOPY  2001   CATARACT EXTRACTION, BILATERAL  09/2019, 10/2019   CHOLECYSTECTOMY  1984   INCISION AND DRAINAGE OF WOUND N/A 09/26/2018   Procedure: Debridement of spine wound;  Surgeon: Wallace Going, DO;  Location: [place];  Service: Plastics;  Laterality: N/A;   KNEE SURGERY Bilateral 08/27/2010 (L) and 01/11/2011 (R)   LUMBAR LAMINECTOMY/DECOMPRESSION MICRODISCECTOMY N/A 07/03/2018   Procedure: Lumbar Three to Lumbar Five Laminectomy;  Surgeon: Judith Part, MD;  Location: [place];  Service: Neurosurgery;  Laterality: N/A;   LUMBAR WOUND  DEBRIDEMENT N/A 08/20/2018   Procedure: POSTERIOR LUMBAR SPINAL WOUND DEBRIDEMENT AND REVISION;  Surgeon: Judith Part, MD;  Location: [place];  Service: Neurosurgery;  Laterality: N/A;  POSTERIOR LUMBAR SPINAL WOUND REVISION   [place]   OVARIAN CYST SURGERY  1968   thymus tumor     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   TONSILLECTOMY     Social History   Occupational History   Occupation: Retired  Tobacco Use   Smoking status: Former    Packs/day: 0.10    Years: 10.00    Total pack years: 1.00    Types: Cigarettes    Quit date: 12/07/1979    Years since quitting: 42.3    Passive exposure: Current   Smokeless tobacco: Never  Vaping Use   Vaping Use: Never used  Substance and Sexual Activity   Alcohol use: No    Alcohol/week: 0.0 standard drinks of alcohol   Drug use: No   Sexual activity: Not Currently

## 2022-04-08 DIAGNOSIS — E1142 Type 2 diabetes mellitus with diabetic polyneuropathy: Secondary | ICD-10-CM | POA: Diagnosis not present

## 2022-04-08 DIAGNOSIS — Z794 Long term (current) use of insulin: Secondary | ICD-10-CM | POA: Diagnosis not present

## 2022-04-11 ENCOUNTER — Encounter: Payer: Self-pay | Admitting: Registered Nurse

## 2022-04-11 ENCOUNTER — Encounter: Payer: Medicare Other | Attending: Physical Medicine and Rehabilitation | Admitting: Registered Nurse

## 2022-04-11 VITALS — BP 109/63 | HR 60 | Ht 66.0 in | Wt 226.8 lb

## 2022-04-11 DIAGNOSIS — M2550 Pain in unspecified joint: Secondary | ICD-10-CM | POA: Diagnosis not present

## 2022-04-11 DIAGNOSIS — M170 Bilateral primary osteoarthritis of knee: Secondary | ICD-10-CM | POA: Insufficient documentation

## 2022-04-11 DIAGNOSIS — M7062 Trochanteric bursitis, left hip: Secondary | ICD-10-CM | POA: Diagnosis not present

## 2022-04-11 DIAGNOSIS — M545 Low back pain, unspecified: Secondary | ICD-10-CM | POA: Insufficient documentation

## 2022-04-11 DIAGNOSIS — Z79891 Long term (current) use of opiate analgesic: Secondary | ICD-10-CM | POA: Insufficient documentation

## 2022-04-11 DIAGNOSIS — M961 Postlaminectomy syndrome, not elsewhere classified: Secondary | ICD-10-CM | POA: Insufficient documentation

## 2022-04-11 DIAGNOSIS — M25511 Pain in right shoulder: Secondary | ICD-10-CM | POA: Insufficient documentation

## 2022-04-11 DIAGNOSIS — G8929 Other chronic pain: Secondary | ICD-10-CM | POA: Diagnosis not present

## 2022-04-11 DIAGNOSIS — G894 Chronic pain syndrome: Secondary | ICD-10-CM | POA: Diagnosis not present

## 2022-04-11 DIAGNOSIS — M5412 Radiculopathy, cervical region: Secondary | ICD-10-CM | POA: Diagnosis present

## 2022-04-11 DIAGNOSIS — G6289 Other specified polyneuropathies: Secondary | ICD-10-CM | POA: Diagnosis not present

## 2022-04-11 DIAGNOSIS — M542 Cervicalgia: Secondary | ICD-10-CM | POA: Diagnosis present

## 2022-04-11 DIAGNOSIS — M25512 Pain in left shoulder: Secondary | ICD-10-CM | POA: Diagnosis not present

## 2022-04-11 DIAGNOSIS — M7061 Trochanteric bursitis, right hip: Secondary | ICD-10-CM | POA: Insufficient documentation

## 2022-04-11 DIAGNOSIS — Z5181 Encounter for therapeutic drug level monitoring: Secondary | ICD-10-CM | POA: Insufficient documentation

## 2022-04-11 MED ORDER — OXYCODONE-ACETAMINOPHEN 10-325 MG PO TABS
1.0000 | ORAL_TABLET | Freq: Four times a day (QID) | ORAL | 0 refills | Status: DC | PRN
Start: 2022-04-11 — End: 2022-05-13

## 2022-04-11 NOTE — Progress Notes (Signed)
Subjective:    Patient ID: Katherine Delgado, female    DOB: [birth year], 81 y.o.   MRN: [id number]  HPI: Katherine Delgado is a 81 y.o. female who returns for follow up appointment for chronic pain and medication refill. She states her pain is located in her neck radiating into her left shoulder,bilateral shoulders R>L, lower back pain, bilateral hip pain and bilateral knee pain. She also reports right hand with tingling and burning and bilateral feet with with tingling and burning and generalized joint pain. She also reports right foot pain. She rates her pain 9. She is not following her usual exercise regime at this time.    Katherine Delgado reports three weeks ago she was transferring from a chair when her right foot became tangled in cover and her right ankle rolled under her, she denies falling. She states she seen her PCP Xrays wer ordered, and she seen orthopedist Dr Sharol Given.   Right Foot X-ray:   IMPRESSION: Osseous demineralization with transverse nondisplaced fracture, base of RIGHT fifth metatarsal.   Katherine Delgado Morphine equivalent is 60.00 MME.   UDS ordered Today.     Pain Inventory Average Pain 9 Pain Right Now 9 My pain is constant, sharp, burning, stabbing, and aching  In the last 24 hours, has pain interfered with the following? General activity 10 Relation with others 10 Enjoyment of life 10 What TIME of day is your pain at its worst? morning  and night Sleep (in general) Poor  Pain is worse with: walking, bending, sitting, inactivity, and standing Pain improves with: pacing activities and medication Relief from Meds: 3  Family History  Problem Relation Age of Onset   Alzheimer's disease Mother    Heart disease Mother    Heart disease Father    Liver disease Father    Cancer Brother    Arthritis Son    Colon polyps Brother    Colon cancer Neg Hx    Esophageal cancer Neg Hx    Kidney disease Neg Hx    Stomach cancer Neg Hx    Rectal cancer Neg Hx    Social History    Socioeconomic History   Marital status: Widowed    Spouse name: Not on file   Number of children: 2   Years of education: 14   Highest education level: Not on file  Occupational History   Occupation: Retired  Tobacco Use   Smoking status: Former    Packs/day: 0.10    Years: 10.00    Total pack years: 1.00    Types: Cigarettes    Quit date: 12/07/1979    Years since quitting: 42.3    Passive exposure: Current   Smokeless tobacco: Never  Vaping Use   Vaping Use: Never used  Substance and Sexual Activity   Alcohol use: No    Alcohol/week: 0.0 standard drinks of alcohol   Drug use: No   Sexual activity: Not Currently  Other Topics Concern   Not on file  Social History Narrative   Walks with cane   Right handed    Caffeine use: Coffee (2 cups every morning)   Tea: sometimes   Soda: none   Social Determinants of Health   Financial Resource Strain: Medium Risk (10/05/2017)   Overall Financial Resource Strain (CARDIA)    Difficulty of Paying Living Expenses: Somewhat hard  Food Insecurity: Food Insecurity Present (10/05/2017)   Hunger Vital Sign    Worried About Running Out of Food in the Last  Year: Sometimes true    Ran Out of Food in the Last Year: Sometimes true  Transportation Needs: No Transportation Needs (10/05/2017)   PRAPARE - Hydrologist (Medical): No    Lack of Transportation (Non-Medical): No  Physical Activity: Inactive (10/05/2017)   Exercise Vital Sign    Days of Exercise per Week: 0 days    Minutes of Exercise per Session: 0 min  Stress: Stress Concern Present (10/05/2017)   [place]    Feeling of Stress : Rather much  Social Connections: Moderately Isolated (10/05/2017)   Social Connection and Isolation Panel [NHANES]    Frequency of Communication with Friends and Family: More than three times a week    Frequency of Social Gatherings with Friends and Family:  More than three times a week    Attends Religious Services: Never    Marine scientist or Organizations: No    Attends Archivist Meetings: Never    Marital Status: Widowed   Past Surgical History:  Procedure Laterality Date   ABDOMINAL HYSTERECTOMY  1974   abdominal tumor  2002   APPENDECTOMY     APPLICATION OF A-CELL OF BACK N/A 09/26/2018   Procedure: With Acell;  Surgeon: Wallace Going, DO;  Location: [facility];  Service: Plastics;  Laterality: N/A;   APPLICATION OF WOUND VAC N/A 09/26/2018   Procedure: Vac placement;  Surgeon: Wallace Going, DO;  Location: [facility];  Service: Plastics;  Laterality: N/A;   BACK SURGERY  1982   BRONCHOSCOPY  2001   CATARACT EXTRACTION, BILATERAL  09/2019, 10/2019   CHOLECYSTECTOMY  1984   INCISION AND DRAINAGE OF WOUND N/A 09/26/2018   Procedure: Debridement of spine wound;  Surgeon: Wallace Going, DO;  Location: [facility];  Service: Plastics;  Laterality: N/A;   KNEE SURGERY Bilateral 08/27/2010 (L) and 01/11/2011 (R)   LUMBAR LAMINECTOMY/DECOMPRESSION MICRODISCECTOMY N/A 07/03/2018   Procedure: Lumbar Three to Lumbar Five Laminectomy;  Surgeon: Judith Part, MD;  Location: [facility];  Service: Neurosurgery;  Laterality: N/A;   LUMBAR WOUND DEBRIDEMENT N/A 08/20/2018   Procedure: POSTERIOR LUMBAR SPINAL WOUND DEBRIDEMENT AND REVISION;  Surgeon: Judith Part, MD;  Location: [facility];  Service: Neurosurgery;  Laterality: N/A;  POSTERIOR LUMBAR SPINAL WOUND REVISION   [place]  1962   OVARIAN CYST SURGERY  1968   thymus tumor     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   TONSILLECTOMY     Past Surgical History:  Procedure Laterality Date   ABDOMINAL HYSTERECTOMY  1974   abdominal tumor  2002   APPENDECTOMY     APPLICATION OF A-CELL OF BACK N/A 09/26/2018   Procedure: With Acell;  Surgeon: Wallace Going, DO;  Location: [facility];  Service: Plastics;  Laterality: N/A;   APPLICATION OF WOUND VAC N/A 09/26/2018   Procedure: Vac placement;   Surgeon: Wallace Going, DO;  Location: [facility];  Service: Plastics;  Laterality: N/A;   BACK SURGERY  1982   BRONCHOSCOPY  2001   CATARACT EXTRACTION, BILATERAL  09/2019, 10/2019   CHOLECYSTECTOMY  1984   INCISION AND DRAINAGE OF WOUND N/A 09/26/2018   Procedure: Debridement of spine wound;  Surgeon: Wallace Going, DO;  Location: [facility];  Service: Plastics;  Laterality: N/A;   KNEE SURGERY Bilateral 08/27/2010 (L) and 01/11/2011 (R)   LUMBAR LAMINECTOMY/DECOMPRESSION MICRODISCECTOMY N/A 07/03/2018   Procedure: Lumbar Three to Lumbar Five Laminectomy;  Surgeon: Judith Part, MD;  Location: [facility];  Service: Neurosurgery;  Laterality: N/A;   LUMBAR WOUND DEBRIDEMENT N/A 08/20/2018   Procedure: POSTERIOR LUMBAR SPINAL WOUND DEBRIDEMENT AND REVISION;  Surgeon: Judith Part, MD;  Location: [facility];  Service: Neurosurgery;  Laterality: N/A;  POSTERIOR LUMBAR SPINAL WOUND REVISION   [place]  1962   OVARIAN CYST SURGERY  1968   thymus tumor     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   TONSILLECTOMY     Past Medical History:  Diagnosis Date   Abnormality of gait 04/19/2016   Acute infective polyneuritis ([place]) 2002   Allergic rhinitis due to pollen    Chronic pain syndrome    COPD (chronic obstructive pulmonary disease) (HCC)    chronic bronchitis   Depressive disorder, not elsewhere classified    Diabetes mellitus without complication ([place])    Diaphragmatic hernia without mention of obstruction or gangrene    Dyslipidemia    Fibromyalgia    GERD (gastroesophageal reflux disease)    with h/o esophagitis   Guillain-Barre syndrome (HCC)    History of benign thymus tumor    Insomnia, unspecified    Lumbar spinal stenosis 03/30/2018   L4-5 level, severe   Miscarriage 1962   Mixed hyperlipidemia    Morbid obesity ([place])    Osteoporosis    Pneumonia    Polyneuropathy in diabetes(357.2)    Rheumatoid arthritis(714.0)    Spondylosis, lumbosacral    Spontaneous ecchymoses    Type II or  unspecified type diabetes mellitus with peripheral circulatory disorders, uncontrolled(250.72)    Unspecified chronic bronchitis (HCC)    Unspecified essential hypertension    Unspecified hypothyroidism    Unspecified pruritic disorder    Unspecified urinary incontinence    BP 109/63   Pulse 60   Ht '5\' 6"'$  (1.676 m)   Wt 226 lb 12.8 oz (102.9 kg)   SpO2 95%   BMI 36.61 kg/m   Opioid Risk Score:   Fall Risk Score:  `1  Depression screen [facility] 2/9     04/11/2022    8:22 AM 02/17/2022   11:22 AM 01/11/2022    9:09 AM 12/14/2021    9:15 AM 08/02/2021   10:15 AM 06/29/2021   10:03 AM 06/01/2021   10:00 AM  Depression screen PHQ 2/9  Decreased Interest 0 0 0 0 1 0 1  Down, Depressed, Hopeless 0 0 0 0 1 0 1  PHQ - 2 Score 0 0 0 0 2 0 2     Review of Systems  Constitutional: Negative.   HENT: Negative.    Eyes: Negative.   Respiratory: Negative.    Cardiovascular: Negative.   Gastrointestinal: Negative.   Endocrine: Negative.   Genitourinary: Negative.   Musculoskeletal:  Positive for gait problem.  Skin: Negative.   Allergic/Immunologic: Negative.   Hematological: Negative.   Psychiatric/Behavioral: Negative.    All other systems reviewed and are negative.      Objective:   Physical Exam Vitals and nursing note reviewed.  Constitutional:      Appearance: Normal appearance.  Neck:     Comments: Cervical Paraspinal Tenderness: C-5-C-6 Cardiovascular:     Rate and Rhythm: Normal rate and regular rhythm.     Pulses: Normal pulses.     Heart sounds: Normal heart sounds.  Pulmonary:     Effort: Pulmonary effort is normal.     Breath sounds: Normal breath sounds.  Musculoskeletal:     Cervical back: Normal range of motion and neck  supple.     Comments: Normal Muscle Bulk and Muscle Testing Reveals:  Upper Extremities: Decreased ROM and Muscle Strength 4/5 Left AC Joint Tenderness Lumbar Paraspinal Tenderness: L-4-L-5 Bilateral Greater Trochanter Tenderness Lower  Extremities : Right: Decreased ROM and Muscle Strength 4/5 Right Lower Extremity Flexion Produces Pain into her Right Ankle  Wearing Post Op Shoe  Left Lower Extremity: Full ROM and Muscle Strength 5/5 Arrived in wheelchair    Skin:    General: Skin is warm and dry.  Neurological:     Mental Status: She is alert and oriented to person, place, and time.  Psychiatric:        Mood and Affect: Mood normal.        Behavior: Behavior normal.          Assessment & Plan:  Failed Back Syndrome: Continue HEP as Tolerated. Continue to Monitor. 04/11/2022 Lumbar Radiculitis: Continue Gabapentin. Continue to monitor. Continue HEP as Tolerated.04/11/2022 Bilateral  Greater Trochanter Bursitis: .Continue to Alternate Ice and Heat Therapy. Continue current medication regimen. Continue to monitor. 04/11/2022 Bilateral Primary OA:/ Both Knees  Continue HEP as Tolerated. Continue to Monitor. 04/11/2022 Insomnia: Continue Amitriptyline . Continue to Monitor.04/11/2022 Chronic Pain Syndrome: Refilled: Oxycodone 10/325 mg one tablet every 6 hours as needed for pain #120. We will continue the opioid monitoring program, this consists of regular clinic visits, examinations, urine drug screen, pill counts as well as use of [place] Controlled Substance Reporting system. A 12 month History has been reviewed on the [place] Controlled Substance Reporting System on 04/11/2022 7. Cervicalgia/ Cervical Radiculitis: Continue Gabapentin. Continue HEP as tolerated. Continue to monitor. 8. Polyarthralgia: Continue HEP as tolerated. Continue to monitor.  9. Polyneuropathy: Continue Gabapentin. Continue to Monitor.   F/U in 1 month

## 2022-04-14 ENCOUNTER — Other Ambulatory Visit: Payer: Self-pay | Admitting: Physician Assistant

## 2022-04-14 LAB — TOXASSURE SELECT,+ANTIDEPR,UR

## 2022-04-14 NOTE — Telephone Encounter (Signed)
Next Visit: 06/02/2022  Last Visit: 03/03/2022  Last Fill: 03/03/2022  [id number] arthritis involving multiple sites with positive rheumatoid factor   Current Dose per office note 03/03/2022:  Morrie Sheldon 5 mg 1 tablet by mouth daily  Labs: 03/03/2022 Creatinine is borderline elevated but has improved.  GFR is borderline low-55.  Rest of CMP WNL.   TB Gold: 09/30/2021 Neg   Okay to refill Morrie Sheldon?

## 2022-04-15 ENCOUNTER — Telehealth: Payer: Self-pay | Admitting: *Deleted

## 2022-04-15 NOTE — Telephone Encounter (Signed)
Urine drug screen for this encounter is consistent for prescribed medication 

## 2022-04-18 ENCOUNTER — Other Ambulatory Visit: Payer: Self-pay | Admitting: Family

## 2022-04-28 ENCOUNTER — Ambulatory Visit: Payer: Medicare Other | Admitting: Orthopedic Surgery

## 2022-04-28 ENCOUNTER — Encounter: Payer: Self-pay | Admitting: Orthopedic Surgery

## 2022-04-28 ENCOUNTER — Ambulatory Visit (INDEPENDENT_AMBULATORY_CARE_PROVIDER_SITE_OTHER): Payer: Medicare Other

## 2022-04-28 DIAGNOSIS — S92354A Nondisplaced fracture of fifth metatarsal bone, right foot, initial encounter for closed fracture: Secondary | ICD-10-CM | POA: Diagnosis not present

## 2022-04-28 NOTE — Progress Notes (Signed)
Office Visit Note   Patient: Katherine Delgado           Date of Birth: 08-Feb-1941           MRN: [id number] Visit Date: 04/28/2022              Requested by: Sandrea Hughs, NP [address] [place],  [place] [address] PCP: Sandrea Hughs, NP  Chief Complaint  Patient presents with   Right Foot - Follow-up      HPI: Patient is an 81 year old woman who presents in follow-up for fracture base of the fifth metatarsal right foot.  She is 1 month she has been ambulating with a postoperative shoe states she is doing well except for prolonged ambulation.  Patient currently in a wheelchair.  Assessment & Plan: Visit Diagnoses:  1. Nondisp fx of fifth metatarsal bone, right foot, init     Plan: Continue to increase activities as tolerated use the postoperative shoe for ambulation.  Follow-up in 4 weeks with three-view radiographs of the right foot.  Follow-Up Instructions: Return in about 4 weeks (around 05/26/2022).   Ortho Exam  Patient is alert, oriented, no adenopathy, well-dressed, normal affect, normal respiratory effort. Examination patient has no ecchymosis or bruising there are no open ulcers.  The fracture site is minimally tender to palpation.  Imaging: XR Foot Complete Right  Result Date: 04/28/2022 Three-view radiographs of the right foot shows improved callus formation through the metaphyseal fracture base of the right fifth metatarsal.  No images are attached to the encounter.  Labs: Lab Results  Component Value Date   HGBA1C 6.4 (A) 03/02/2022   HGBA1C 5.9 (A) 09/02/2021   HGBA1C 5.9 (A) 04/29/2021   ESRSEDRATE 29 (H) 01/25/2021   ESRSEDRATE 29 04/02/2019   ESRSEDRATE 24 (H) 08/17/2018   CRP 1.4 (H) 01/25/2021   CRP 2.9 (H) 08/17/2018   LABURIC 5.7 10/06/2016   REPTSTATUS 10/01/2018 FINAL 09/26/2018   GRAMSTAIN NO WBC SEEN NO ORGANISMS SEEN  09/26/2018   CULT  09/26/2018    RARE DIPHTHEROIDS(CORYNEBACTERIUM SPECIES) Standardized susceptibility testing for  this organism is not available. NO ANAEROBES ISOLATED Performed at [facility], [place] [address] [place], [place] [address]    LABORGA NO GROWTH 11/21/2016     Lab Results  Component Value Date   ALBUMIN 3.5 01/25/2021   ALBUMIN 2.6 (L) 08/22/2018   ALBUMIN 3.9 02/20/2017    Lab Results  Component Value Date   MG 2.0 09/06/2016   Lab Results  Component Value Date   VD25OH 30 12/27/2017   VD25OH 20 (L) 04/27/2017    No results found for: "PREALBUMIN"    Latest Ref Rng & Units 03/03/2022    8:50 AM 09/30/2021    3:32 PM 03/31/2021    8:36 AM  CBC EXTENDED  WBC 3.8 - 10.8 Thousand/uL 13.2  14.7  10.5   RBC 3.80 - 5.10 Million/uL 5.16  5.15  5.24   Hemoglobin 11.7 - 15.5 g/dL 14.2  13.6  14.2   HCT 35.0 - 45.0 % 43.7  42.4  44.3   Platelets 140 - 400 Thousand/uL 338  363  258   NEUT# 1,500 - 7,800 cells/uL 7,828  10,790  8.8   Lymph# 850 - 3,900 cells/uL 3,287  1,940  0.7      There is no height or weight on file to calculate BMI.  Orders:  Orders Placed This Encounter  Procedures   XR Foot Complete Right  No orders of the defined types were placed in this encounter.    Procedures: No procedures performed  Clinical Data: No additional findings.  ROS:  All other systems negative, except as noted in the HPI. Review of Systems  Objective: Vital Signs: There were no vitals taken for this visit.  Specialty Comments:  No specialty comments available.  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   Primary osteoarthritis of right knee 07/25/2019   Wound dehiscence 08/17/2018   Lumbar spinal stenosis 03/30/2018   Rheumatoid arthritis involving both wrists with positive rheumatoid factor ([place]) 06/14/2016   High risk  medication use 06/14/2016   Sjogren's syndrome ([place]) 06/14/2016   Primary osteoarthritis of both knees 06/14/2016   DDD (degenerative disc disease), lumbar 06/14/2016   Hypothyroidism 06/14/2016   Osteoporosis 06/14/2016   Abnormality of gait 04/19/2016   Type 2 diabetes mellitus with diabetic polyneuropathy, with long-term current use of insulin ([place]) 11/27/2015   Diabetes mellitus without complication ([place]) 92/06/9416   Headache(784.0) 07/31/2013   Sinus infection 07/31/2013   Chronic right-sided low back pain with right-sided sciatica 03/14/2013   Insomnia 12/06/2012   Nausea alone 12/06/2012   Diarrhea 12/06/2012   Urinary incontinence, urge 12/06/2012   Lumbosacral root lesions, not elsewhere classified 11/27/2012   Diabetic polyneuropathy associated with type 2 diabetes mellitus ([place]) 11/27/2012   EDEMA 05/04/2010   YEAST INFECTION 05/03/2010   Hyperlipidemia 05/03/2010   DEPRESSION 05/03/2010   History of peripheral neuropathy 05/03/2010   Essential hypertension 05/03/2010   ALLERGIC RHINITIS 05/03/2010   PNEUMONIA 05/03/2010   COPD (chronic obstructive pulmonary disease) ([place]) 05/03/2010   GERD 05/03/2010   Fibromyalgia 05/03/2010   DYSPNEA 05/03/2010   CHEST PAIN 05/03/2010   Past Medical History:  Diagnosis Date   Abnormality of gait 04/19/2016   Acute infective polyneuritis ([place]) 2002   Allergic rhinitis due to pollen    Chronic pain syndrome    COPD (chronic obstructive pulmonary disease) (HCC)    chronic bronchitis   Depressive disorder, not elsewhere classified    Diabetes mellitus without complication ([place])    Diaphragmatic hernia without mention of obstruction or gangrene    Dyslipidemia    Fibromyalgia    GERD (gastroesophageal reflux disease)    with h/o esophagitis   Guillain-Barre syndrome ([place])    History of benign thymus tumor    Insomnia, unspecified    Lumbar spinal stenosis 03/30/2018   L4-5 level, severe   Miscarriage 1962   Mixed  hyperlipidemia    Morbid obesity ([place])    Osteoporosis    Pneumonia    Polyneuropathy in diabetes(357.2)    Rheumatoid arthritis(714.0)    Spondylosis, lumbosacral    Spontaneous ecchymoses    Type II or unspecified type diabetes mellitus with peripheral circulatory disorders, uncontrolled(250.72)    Unspecified chronic bronchitis (HCC)    Unspecified essential hypertension    Unspecified hypothyroidism    Unspecified pruritic disorder    Unspecified urinary incontinence     Family History  Problem Relation Age of Onset   Alzheimer's disease Mother    Heart disease Mother    Heart disease Father    Liver disease Father    Cancer Brother    Arthritis Son    Colon polyps Brother    Colon  cancer Neg Hx    Esophageal cancer Neg Hx    Kidney disease Neg Hx    Stomach cancer Neg Hx    Rectal cancer Neg Hx     Past Surgical History:  Procedure Laterality Date   ABDOMINAL HYSTERECTOMY  1974   abdominal tumor  2002   APPENDECTOMY     APPLICATION OF A-CELL OF BACK N/A 09/26/2018   Procedure: With Acell;  Surgeon: Wallace Going, DO;  Location: [facility];  Service: Plastics;  Laterality: N/A;   APPLICATION OF WOUND VAC N/A 09/26/2018   Procedure: Vac placement;  Surgeon: Wallace Going, DO;  Location: [facility];  Service: Plastics;  Laterality: N/A;   BACK SURGERY  1982   BRONCHOSCOPY  2001   CATARACT EXTRACTION, BILATERAL  09/2019, 10/2019   CHOLECYSTECTOMY  1984   INCISION AND DRAINAGE OF WOUND N/A 09/26/2018   Procedure: Debridement of spine wound;  Surgeon: Wallace Going, DO;  Location: [facility];  Service: Plastics;  Laterality: N/A;   KNEE SURGERY Bilateral 08/27/2010 (L) and 01/11/2011 (R)   LUMBAR LAMINECTOMY/DECOMPRESSION MICRODISCECTOMY N/A 07/03/2018   Procedure: Lumbar Three to Lumbar Five Laminectomy;  Surgeon: Judith Part, MD;  Location: [facility];  Service: Neurosurgery;  Laterality: N/A;   LUMBAR WOUND DEBRIDEMENT N/A 08/20/2018   Procedure: POSTERIOR LUMBAR  SPINAL WOUND DEBRIDEMENT AND REVISION;  Surgeon: Judith Part, MD;  Location: [facility];  Service: Neurosurgery;  Laterality: N/A;  POSTERIOR LUMBAR SPINAL WOUND REVISION   [place]   OVARIAN CYST SURGERY  1968   thymus tumor     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   TONSILLECTOMY     Social History   Occupational History   Occupation: Retired  Tobacco Use   Smoking status: Former    Packs/day: 0.10    Years: 10.00    Total pack years: 1.00    Types: Cigarettes    Quit date: 12/07/1979    Years since quitting: 42.4    Passive exposure: Current   Smokeless tobacco: Never  Vaping Use   Vaping Use: Never used  Substance and Sexual Activity   Alcohol use: No    Alcohol/week: 0.0 standard drinks of alcohol   Drug use: No   Sexual activity: Not Currently

## 2022-05-09 ENCOUNTER — Other Ambulatory Visit: Payer: Self-pay | Admitting: Internal Medicine

## 2022-05-09 ENCOUNTER — Other Ambulatory Visit: Payer: Self-pay | Admitting: Family

## 2022-05-09 DIAGNOSIS — N3281 Overactive bladder: Secondary | ICD-10-CM

## 2022-05-12 ENCOUNTER — Telehealth: Payer: Self-pay

## 2022-05-12 ENCOUNTER — Encounter: Payer: Medicare Other | Admitting: Registered Nurse

## 2022-05-12 NOTE — Telephone Encounter (Signed)
Patient called and stated she was not able to come to appointment due to no gas or money for copay. She wants to know if her medications can be sent in for her

## 2022-05-13 ENCOUNTER — Telehealth: Payer: Self-pay | Admitting: *Deleted

## 2022-05-13 MED ORDER — OXYCODONE-ACETAMINOPHEN 10-325 MG PO TABS: 1.0000 | ORAL_TABLET | Freq: Four times a day (QID) | ORAL | 0 refills | Status: DC | PRN

## 2022-05-13 NOTE — Telephone Encounter (Signed)
Katherine Delgado called for her refill on her oxycodone acetaminophen 10/325. She did call earlier in the week and it was sent to Dr Ranell Patrick but she requested a video visit with Katherine Delgado but the message was not addressed. Katherine Delgado is out of the office.

## 2022-05-16 ENCOUNTER — Encounter
Payer: Medicare Other | Attending: Physical Medicine and Rehabilitation | Admitting: Physical Medicine and Rehabilitation

## 2022-05-16 DIAGNOSIS — S92909S Unspecified fracture of unspecified foot, sequela: Secondary | ICD-10-CM | POA: Diagnosis not present

## 2022-05-16 DIAGNOSIS — G894 Chronic pain syndrome: Secondary | ICD-10-CM | POA: Diagnosis not present

## 2022-05-16 MED ORDER — OXYCODONE-ACETAMINOPHEN 10-325 MG PO TABS: 1.0000 | ORAL_TABLET | Freq: Four times a day (QID) | ORAL | 0 refills | Status: DC | PRN

## 2022-05-16 MED ORDER — VITAMIN D (ERGOCALCIFEROL) 1.25 MG (50000 UNIT) PO CAPS: 50000.0000 [IU] | ORAL_CAPSULE | ORAL | 0 refills | Status: DC

## 2022-05-16 NOTE — Progress Notes (Signed)
Subjective:    Patient ID: Katherine Delgado, female    DOB: [birth year], 81 y.o.   MRN: [id number]  HPI:  An audio/video tele-health visit is felt to be the most appropriate encounter for this patient at this time. This is a follow up tele-visit via phone. The patient is at home. MD is at office. Prior to scheduling this appointment, our staff discussed the limitations of evaluation and management by telemedicine and the availability of in-person appointments. The patient expressed understanding and agreed to proceed.   DEYANNA Delgado is a 81 y.o. female who returns for follow-up for chronic pain and medication refill. She states her pain is located in her bilateral shoulders, lower back pain radiating into his left hip. Her current exercise regime is receiving PT/OT two days a week.  1) RUE psoriatic arthritis Ms. Ingwersen Morphine equivalent is 60.00  MME.  -misdiagnosed as shingles initially.  -The pain has gotten better but it still can be sever and feels like someone beat her with a hammer.  -Pain radiates up into the elbow and shoulder.  -The pain can be excruciating.  -She asks if we can add something else.  -no benefit from Qutenza.  -pain is currently worst in her elbow -radiates all the way into her hand  2) Pneumonia She was hospitalized with pneumonia since I saw her last. She was also told she had shingles, and she is toward the end of it now. Her fingers still have decreased strength on her right side- she does note improvements. She cannot get her right thumb to elevated. She will be seeing Dr. Estanislado Pandy. She has been unable to write.   3) Incontinence She has been using cranberry for her incontinence.   4) Failed back syndrome -due for refill of Percocet early next month -she hates to think that she will be in pain for the rest of her years -it continues to provide her relief -provided with pain journal last visit -her son accompanies her today -she did obtain relief from  the lidocaine patch before -combination of tylenol and Percocet is working for her  5) Insomnia -not sleeping at night -she is taking the sleeping medication I prescribed -she feels if she can get some sleep that is great -we increased the Amitriptyline to '50mg'$  at once point -she know that sleep is a major factor.   6) Foot fracture -she has been following up with Dr Sharol Given -her pain was worse initially but has now improved and she does not feel she needs any additional pain medication.  -she has a follow-up appointment with Dr. Sharol Given and is hoping she will be discharged soon. She will ask him whether she would benefit from PT  7) Chronic pain syndrome -she just picked up her Percocet refill on Friday.  -she feels like her diffuse aches and pains have been worsening -she has been trying to eat mostly fruits and vegetables -she would be interested in vitamin D supplementation    Pain Inventory Average Pain 8 Pain Right Now 9 My pain is sharp, burning, stabbing, and aching  In the last 24 hours, has pain interfered with the following? General activity 10 Relation with others 10 Enjoyment of life 10 What TIME of day is your pain at its worst? morning , daytime, and night Sleep (in general) Poor  Pain is worse with: walking, bending, sitting, standing, and some activites Pain improves with: rest, therapy/exercise, and medication Relief from Meds: 6  Family History  Problem  Relation Age of Onset   Alzheimer's disease Mother    Heart disease Mother    Heart disease Father    Liver disease Father    Cancer Brother    Arthritis Son    Colon polyps Brother    Colon cancer Neg Hx    Esophageal cancer Neg Hx    Kidney disease Neg Hx    Stomach cancer Neg Hx    Rectal cancer Neg Hx    Social History   Socioeconomic History   Marital status: Widowed    Spouse name: Not on file   Number of children: 2   Years of education: 14   Highest education level: Not on file   Occupational History   Occupation: Retired  Tobacco Use   Smoking status: Former    Packs/day: 0.10    Years: 10.00    Total pack years: 1.00    Types: Cigarettes    Quit date: 12/07/1979    Years since quitting: 42.4    Passive exposure: Current   Smokeless tobacco: Never  Vaping Use   Vaping Use: Never used  Substance and Sexual Activity   Alcohol use: No    Alcohol/week: 0.0 standard drinks of alcohol   Drug use: No   Sexual activity: Not Currently  Other Topics Concern   Not on file  Social History Narrative   Walks with cane   Right handed    Caffeine use: Coffee (2 cups every morning)   Tea: sometimes   Soda: none   Social Determinants of Health   Financial Resource Strain: Medium Risk (10/05/2017)   Overall Financial Resource Strain (CARDIA)    Difficulty of Paying Living Expenses: Somewhat hard  Food Insecurity: Food Insecurity Present (10/05/2017)   Hunger Vital Sign    Worried About Running Out of Food in the Last Year: Sometimes true    Ran Out of Food in the Last Year: Sometimes true  Transportation Needs: No Transportation Needs (10/05/2017)   PRAPARE - Hydrologist (Medical): No    Lack of Transportation (Non-Medical): No  Physical Activity: Inactive (10/05/2017)   Exercise Vital Sign    Days of Exercise per Week: 0 days    Minutes of Exercise per Session: 0 min  Stress: Stress Concern Present (10/05/2017)   [place]    Feeling of Stress : Rather much  Social Connections: Moderately Isolated (10/05/2017)   Social Connection and Isolation Panel [NHANES]    Frequency of Communication with Friends and Family: More than three times a week    Frequency of Social Gatherings with Friends and Family: More than three times a week    Attends Religious Services: Never    Marine scientist or Organizations: No    Attends Archivist Meetings: Never     Marital Status: Widowed   Past Surgical History:  Procedure Laterality Date   ABDOMINAL HYSTERECTOMY  1974   abdominal tumor  2002   APPENDECTOMY     APPLICATION OF A-CELL OF BACK N/A 09/26/2018   Procedure: With Acell;  Surgeon: Wallace Going, DO;  Location: [place];  Service: Plastics;  Laterality: N/A;   APPLICATION OF WOUND VAC N/A 09/26/2018   Procedure: Vac placement;  Surgeon: Wallace Going, DO;  Location: [place];  Service: Plastics;  Laterality: N/A;   BACK SURGERY  1982   BRONCHOSCOPY  2001   CATARACT EXTRACTION, BILATERAL  09/2019, 10/2019  CHOLECYSTECTOMY  1984   INCISION AND DRAINAGE OF WOUND N/A 09/26/2018   Procedure: Debridement of spine wound;  Surgeon: Wallace Going, DO;  Location: [place];  Service: Plastics;  Laterality: N/A;   KNEE SURGERY Bilateral 08/27/2010 (L) and 01/11/2011 (R)   LUMBAR LAMINECTOMY/DECOMPRESSION MICRODISCECTOMY N/A 07/03/2018   Procedure: Lumbar Three to Lumbar Five Laminectomy;  Surgeon: Judith Part, MD;  Location: [place];  Service: Neurosurgery;  Laterality: N/A;   LUMBAR WOUND DEBRIDEMENT N/A 08/20/2018   Procedure: POSTERIOR LUMBAR SPINAL WOUND DEBRIDEMENT AND REVISION;  Surgeon: Judith Part, MD;  Location: [place];  Service: Neurosurgery;  Laterality: N/A;  POSTERIOR LUMBAR SPINAL WOUND REVISION   miscarrage  1962   OVARIAN CYST SURGERY  1968   thymus tumor     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   TONSILLECTOMY     Past Surgical History:  Procedure Laterality Date   ABDOMINAL HYSTERECTOMY  1974   abdominal tumor  2002   APPENDECTOMY     APPLICATION OF A-CELL OF BACK N/A 09/26/2018   Procedure: With Acell;  Surgeon: Wallace Going, DO;  Location: [place];  Service: Plastics;  Laterality: N/A;   APPLICATION OF WOUND VAC N/A 09/26/2018   Procedure: Vac placement;  Surgeon: Wallace Going, DO;  Location: [place];  Service: Plastics;  Laterality: N/A;   BACK SURGERY  1982   BRONCHOSCOPY  2001   CATARACT EXTRACTION,  BILATERAL  09/2019, 10/2019   CHOLECYSTECTOMY  1984   INCISION AND DRAINAGE OF WOUND N/A 09/26/2018   Procedure: Debridement of spine wound;  Surgeon: Wallace Going, DO;  Location: [place];  Service: Plastics;  Laterality: N/A;   KNEE SURGERY Bilateral 08/27/2010 (L) and 01/11/2011 (R)   LUMBAR LAMINECTOMY/DECOMPRESSION MICRODISCECTOMY N/A 07/03/2018   Procedure: Lumbar Three to Lumbar Five Laminectomy;  Surgeon: Judith Part, MD;  Location: [place];  Service: Neurosurgery;  Laterality: N/A;   LUMBAR WOUND DEBRIDEMENT N/A 08/20/2018   Procedure: POSTERIOR LUMBAR SPINAL WOUND DEBRIDEMENT AND REVISION;  Surgeon: Judith Part, MD;  Location: [place];  Service: Neurosurgery;  Laterality: N/A;  POSTERIOR LUMBAR SPINAL WOUND REVISION   miscarrage  1962   OVARIAN CYST SURGERY  1968   thymus tumor     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   TONSILLECTOMY     Past Medical History:  Diagnosis Date   Abnormality of gait 04/19/2016   Acute infective polyneuritis ([place]) 2002   Allergic rhinitis due to pollen    Chronic pain syndrome    COPD (chronic obstructive pulmonary disease) (HCC)    chronic bronchitis   Depressive disorder, not elsewhere classified    Diabetes mellitus without complication ([place])    Diaphragmatic hernia without mention of obstruction or gangrene    Dyslipidemia    Fibromyalgia    GERD (gastroesophageal reflux disease)    with h/o esophagitis   Guillain-Barre syndrome (HCC)    History of benign thymus tumor    Insomnia, unspecified    Lumbar spinal stenosis 03/30/2018   L4-5 level, severe   Miscarriage 1962   Mixed hyperlipidemia    Morbid obesity ([place])    Osteoporosis    Pneumonia    Polyneuropathy in diabetes(357.2)    Rheumatoid arthritis(714.0)    Spondylosis, lumbosacral    Spontaneous ecchymoses    Type II or unspecified type diabetes mellitus with peripheral circulatory disorders, uncontrolled(250.72)    Unspecified chronic bronchitis (HCC)    Unspecified essential  hypertension    Unspecified hypothyroidism  Unspecified pruritic disorder    Unspecified urinary incontinence    There were no vitals taken for this visit.  Opioid Risk Score:   Fall Risk Score:  `1  Depression screen [facility] 2/9     04/11/2022    8:22 AM 02/17/2022   11:22 AM 01/11/2022    9:09 AM 12/14/2021    9:15 AM 08/02/2021   10:15 AM 06/29/2021   10:03 AM 06/01/2021   10:00 AM  Depression screen PH[facility] 2/9  Decreased Interest 0 0 0 0 1 0 1  Down, Depressed, Hopeless 0 0 0 0 1 0 1  PHQ - 2 Score 0 0 0 0 2 0 2       Review of Systems  Constitutional: Negative.   HENT: Negative.    Eyes: Negative.   Respiratory: Negative.    Cardiovascular: Negative.   Gastrointestinal: Negative.   Endocrine: Negative.   Genitourinary: Negative.   Musculoskeletal:  Positive for back pain, gait problem and neck pain.       Pain in noth legs, pain  in right shoulder , pain in both knees , pain in both feet  Skin: Negative.   Allergic/Immunologic: Negative.   Hematological: Negative.   Psychiatric/Behavioral: Negative.         Objective:  Not performed as patient was seen via phone.     Assessment & Plan:  Failed Back Syndrome: Coninue HEP as Tolerated. Continue to Monitor. She is receiving Home Health therapy at this time, PT/OT two days a week. 03/11/2021. Prescribed lidocaine patch.  -Discussed current symptoms of pain and history of pain.  -Discussed benefits of exercise in reducing pain. -Discussed following foods that may reduce pain: 1) Ginger (especially studied for arthritis)- reduce leukotriene production to decrease inflammation 2) Blueberries- high in phytonutrients that decrease inflammation 3) Salmon- marine omega-3s reduce joint swelling and pain 4) Pumpkin seeds- reduce inflammation 5) dark chocolate- reduces inflammation 6) turmeric- reduces inflammation 7) tart cherries - reduce pain and stiffness 8) extra virgin olive oil - its compound olecanthal helps to  block prostaglandins  9) chili peppers- can be eaten or applied topically via capsaicin 10) mint- helpful for headache, muscle aches, joint pain, and itching 11) garlic- reduces inflammation  Link to further information on diet for chronic pain: http://www.randall.com/  Lumbar Radiculitis: Continue Gabapentin. Continue to monitor. Continue HEP as Tolerated.03/11/2021 Left  Greater Trochanter Bursitis: Scheduled for left hip injection with Dr Ranell Patrick.Continue to Alternate Ice and Heat Therapy. Continue current medication regimen. Continue to monitor. 03/11/2021 Bilateral Primary OA: Continue HEP as Tolerated. Continue to Monitor. 03/11/2021 Insomnia: Continue Amitriptyline to '50mg'$ . Continue to Monitor.03/11/2021. Discussed trying tart cherry juice -Try to go outside near sunrise -Get exercise during the day.  -Turn off all devices an hour before bedtime.  -Teas that can benefit: chamomile, valerian root, Brahmi (Bacopa) -Can consider over the counter melatonin, magnesium, and/or L-theanine. Melatonin is an anti-oxidant with multiple health benefits. Magnesium is involved in greater than 300 enzymatic reactions in the body and most of [place] are deficient as our soil is often depleted. There are 7 different types of magnesium- Bioptemizer's is a supplement with all 7 types, and each has unique benefits. Magnesium can also help with constipation and anxiety.  -Pistachios naturally increase the production of melatonin -Cozy Earth bamboo bed sheets are free from toxic chemicals.  -Tart cherry juice or a tart cherry supplement can improve sleep and soreness post-workout     Chronic Pain Syndrome: Refilled Oxycodone 10/325 mg one  tablet every 6 hours as needed for pain #120. We will continue the opioid monitoring program, this consists of regular clinic visits, examinations, urine drug screen, pill counts as well as use of [place]  Controlled Substance Reporting system. Discussed the differences between oxycodone and oxycontin. Provided with a pain relief journal -will perform food sensitivity testing next visit given worsening diffuse aches and pains -commended on including mostly fruits and vegetables in her diet. -Discussed following foods that may reduce pain: 1) Ginger (especially studied for arthritis)- reduce leukotriene production to decrease inflammation 2) Blueberries- high in phytonutrients that decrease inflammation 3) Salmon- marine omega-3s reduce joint swelling and pain 4) Pumpkin seeds- reduce inflammation 5) dark chocolate- reduces inflammation 6) turmeric- reduces inflammation 7) tart cherries - reduce pain and stiffness 8) extra virgin olive oil - its compound olecanthal helps to block prostaglandins  9) chili peppers- can be eaten or applied topically via capsaicin 10) mint- helpful for headache, muscle aches, joint pain, and itching 11) garlic- reduces inflammation  Link to further information on diet for chronic pain: http://www.randall.com/   Right hand decreased strength post-shingles: provided referral for occupational therapy  8. Right sided excruciating post-shingles pain -will not try Qutenza again since did not help  -commended on eating anti-inflammatory foods.   -recommended doTerra Deep Blue Essential oil and applied to area of pain today- discussed that this is made of natural plant oils. Shared by personal experience of benefit from use of this essential oil -provided a link to a pdf of Pete Escogue's musculoskeletal alignment exercises: https://www.berger.biz/.pdf   -Provided with a pain relief journal and discussed that it contains foods and lifestyle tips to naturally help to improve pain. Discussed that these lifestyle strategies are also very good for health unlike  some medications which can have negative side effects. Discussed that the act of keeping a journal can be therapeutic and helpful to realize patterns what helps to trigger and alleviate pain.    9) Peripheral neuropathy: -Discussed Qutenza as an option for neuropathic pain control. Discussed that this is a capsaicin patch, stronger than capsaicin cream. Discussed that it is currently approved for diabetic peripheral neuropathy and post-herpetic neuralgia, but that it has also shown benefit in treating other forms of neuropathy. Provided patient with link to site to learn more about the patch: CinemaBonus.fr. Discussed that the patch would be placed in office and benefits usually last 3 months. Discussed that unintended exposure to capsaicin can cause severe irritation of eyes, mucous membranes, respiratory tract, and skin, but that Qutenza is a local treatment and does not have the systemic side effects of other nerve medications. Discussed that there may be pain, itching, erythema, and decreased sensory function associated with the application of Qutenza. Side effects usually subside within 1 week. A cold pack of analgesic medications can help with these side effects. Blood pressure can also be increased due to pain associated with administration of the patch.   10) Foot fracture: -discussed that if she experiences a fracture in the future it is permissible for ortho to take over pain management temporarily as needed to manage acute pain -prescribed high dose vitamin D for pain management, to promote wound healing, and to reduce fracture risk in the future -encouraged discussion of PT with ortho at follow-up  14 minutes spent in discussion of her pain, recent foot fracture, discussing that in the future she is permitted to have ortho take over pain management temporarily for acute pain, discussed improvement in her pain  currently

## 2022-05-19 NOTE — Progress Notes (Deleted)
Office Visit Note  Patient: Katherine Delgado             Date of Birth: 1940-09-24           MRN: [id number]             PCP: Sandrea Hughs, NP Referring: Sandrea Hughs, NP Visit Date: 06/02/2022 Occupation: '@GUAROCC'$ @  Subjective:  No chief complaint on file.   History of Present Illness: Katherine Delgado is a 81 y.o. female ***   Activities of Daily Living:  Patient reports morning stiffness for *** {minute/hour:19697}.   Patient {ACTIONS;DENIES/REPORTS:21021675::"Denies"} nocturnal pain.  Difficulty dressing/grooming: {ACTIONS;DENIES/REPORTS:21021675::"Denies"} Difficulty climbing stairs: {ACTIONS;DENIES/REPORTS:21021675::"Denies"} Difficulty getting out of chair: {ACTIONS;DENIES/REPORTS:21021675::"Denies"} Difficulty using hands for taps, buttons, cutlery, and/or writing: {ACTIONS;DENIES/REPORTS:21021675::"Denies"}  No Rheumatology ROS completed.   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[place]) 02/10/2020   Primary osteoarthritis of right knee 07/25/2019   Wound dehiscence 08/17/2018   Lumbar spinal stenosis 03/30/2018   Rheumatoid arthritis involving both wrists with positive rheumatoid factor ([place]) 06/14/2016   High risk medication use 06/14/2016   Sjogren's syndrome ([place]) 06/14/2016   Primary osteoarthritis of both knees 06/14/2016   DDD (degenerative disc disease), lumbar 06/14/2016   Hypothyroidism 06/14/2016   Osteoporosis 06/14/2016   Abnormality of gait 04/19/2016   Type 2 diabetes mellitus with diabetic polyneuropathy, with long-term current use of insulin ([place]) 11/27/2015   Diabetes mellitus without complication ([place]) 46/56/8127   Headache(784.0) 07/31/2013   Sinus infection 07/31/2013   Chronic right-sided low back pain with right-sided  sciatica 03/14/2013   Insomnia 12/06/2012   Nausea alone 12/06/2012   Diarrhea 12/06/2012   Urinary incontinence, urge 12/06/2012   Lumbosacral root lesions, not elsewhere classified 11/27/2012   Diabetic polyneuropathy associated with type 2 diabetes mellitus ([place]) 11/27/2012   EDEMA 05/04/2010   YEAST INFECTION 05/03/2010   Hyperlipidemia 05/03/2010   DEPRESSION 05/03/2010   History of peripheral neuropathy 05/03/2010   Essential hypertension 05/03/2010   ALLERGIC RHINITIS 05/03/2010   PNEUMONIA 05/03/2010   COPD (chronic obstructive pulmonary disease) ([place]) 05/03/2010   GERD 05/03/2010   Fibromyalgia 05/03/2010   DYSPNEA 05/03/2010   CHEST PAIN 05/03/2010    Past Medical History:  Diagnosis Date   Abnormality of gait 04/19/2016   Acute infective polyneuritis ([place]) 2002   Allergic rhinitis due to pollen    Chronic pain syndrome    COPD (chronic obstructive pulmonary disease) (HCC)    chronic bronchitis   Depressive disorder, not elsewhere classified    Diabetes mellitus without complication ([place])    Diaphragmatic hernia without mention of obstruction or gangrene    Dyslipidemia    Fibromyalgia    GERD (gastroesophageal reflux disease)    with h/o esophagitis   Guillain-Barre syndrome ([place])    History of benign thymus tumor    Insomnia, unspecified    Lumbar spinal stenosis 03/30/2018   L4-5 level, severe   Miscarriage 1962   Mixed hyperlipidemia    Morbid obesity ([place])    Osteoporosis    Pneumonia    Polyneuropathy in diabetes(357.2)    Rheumatoid arthritis(714.0)    Spondylosis, lumbosacral    Spontaneous ecchymoses    Type II or unspecified type diabetes mellitus with peripheral circulatory disorders, uncontrolled(250.72)    Unspecified chronic bronchitis ([place])  Unspecified essential hypertension    Unspecified hypothyroidism    Unspecified pruritic disorder    Unspecified urinary incontinence     Family History  Problem Relation Age of Onset    Alzheimer's disease Mother    Heart disease Mother    Heart disease Father    Liver disease Father    Cancer Brother    Arthritis Son    Colon polyps Brother    Colon cancer Neg Hx    Esophageal cancer Neg Hx    Kidney disease Neg Hx    Stomach cancer Neg Hx    Rectal cancer Neg Hx    Past Surgical History:  Procedure Laterality Date   ABDOMINAL HYSTERECTOMY  1974   abdominal tumor  2002   APPENDECTOMY     APPLICATION OF A-CELL OF BACK N/A 09/26/2018   Procedure: With Acell;  Surgeon: Wallace Going, DO;  Location: [facility];  Service: Plastics;  Laterality: N/A;   APPLICATION OF WOUND VAC N/A 09/26/2018   Procedure: Vac placement;  Surgeon: Wallace Going, DO;  Location: [facility];  Service: Plastics;  Laterality: N/A;   BACK SURGERY  1982   BRONCHOSCOPY  2001   CATARACT EXTRACTION, BILATERAL  09/2019, 10/2019   CHOLECYSTECTOMY  1984   INCISION AND DRAINAGE OF WOUND N/A 09/26/2018   Procedure: Debridement of spine wound;  Surgeon: Wallace Going, DO;  Location: [facility];  Service: Plastics;  Laterality: N/A;   KNEE SURGERY Bilateral 08/27/2010 (L) and 01/11/2011 (R)   LUMBAR LAMINECTOMY/DECOMPRESSION MICRODISCECTOMY N/A 07/03/2018   Procedure: Lumbar Three to Lumbar Five Laminectomy;  Surgeon: Judith Part, MD;  Location: [facility];  Service: Neurosurgery;  Laterality: N/A;   LUMBAR WOUND DEBRIDEMENT N/A 08/20/2018   Procedure: POSTERIOR LUMBAR SPINAL WOUND DEBRIDEMENT AND REVISION;  Surgeon: Judith Part, MD;  Location: [facility];  Service: Neurosurgery;  Laterality: N/A;  POSTERIOR LUMBAR SPINAL WOUND REVISION   miscarrage  1962   OVARIAN CYST SURGERY  1968   thymus tumor     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   TONSILLECTOMY     Social History   Social History Narrative   Walks with cane   Right handed    Caffeine use: Coffee (2 cups every morning)   Tea: sometimes   Soda: none   Immunization History  Administered Date(s) Administered   Influenza-Unspecified 06/10/2011    PFIZER(Purple Top)SARS-COV-2 Vaccination 11/13/2019, 11/30/2019   Pneumococcal Conjugate-13 07/07/2016   Pneumococcal Polysaccharide-23 11/21/2003, 10/05/2017   Tdap 05/28/2016     Objective: Vital Signs: There were no vitals taken for this visit.   Physical Exam   Musculoskeletal Exam: ***  CDAI Exam: CDAI Score: -- Patient Global: --; Provider Global: -- Swollen: --; Tender: -- Joint Exam 06/02/2022   No joint exam has been documented for this visit   There is currently no information documented on the homunculus. Go to the Rheumatology activity and complete the homunculus joint exam.  Investigation: No additional findings.  Imaging: XR Foot Complete Right  Result Date: 04/28/2022 Three-view radiographs of the right foot shows improved callus formation through the metaphyseal fracture base of the right fifth metatarsal.   Recent Labs: Lab Results  Component Value Date   WBC 13.2 (H) 03/03/2022   HGB 14.2 03/03/2022   PLT 338 03/03/2022   NA 143 03/03/2022   K 3.7 03/03/2022   CL 102 03/03/2022   CO2 29 03/03/2022   GLUCOSE 41 (L) 03/03/2022   BUN 16 03/03/2022  CREATININE 1.03 (H) 03/03/2022   BILITOT 0.4 03/03/2022   ALKPHOS 96 01/25/2021   AST 17 03/03/2022   ALT 10 03/03/2022   PROT 6.9 03/03/2022   ALBUMIN 3.5 01/25/2021   CALCIUM 9.6 03/03/2022   GFRAA 65 02/12/2021   QFTBGOLDPLUS NEGATIVE 09/30/2021    Speciality Comments: No specialty comments available.  Procedures:  No procedures performed Allergies: Influenza vaccines, Penicillins, Statins, and Sulfamethoxazole-trimethoprim   Assessment / Plan:     Visit Diagnoses: No diagnosis found.  Orders: No orders of the defined types were placed in this encounter.  No orders of the defined types were placed in this encounter.   Face-to-face time spent with patient was *** minutes. Greater than 50% of time was spent in counseling and coordination of care.  Follow-Up Instructions: No  follow-ups on file.   Earnestine Mealing, CMA  Note - This record has been created using Editor, commissioning.  Chart creation errors have been sought, but may not always  have been located. Such creation errors do not reflect on  the standard of medical care.

## 2022-05-23 ENCOUNTER — Ambulatory Visit: Payer: Medicare Other | Admitting: Orthopedic Surgery

## 2022-05-24 ENCOUNTER — Encounter: Payer: Medicare Other | Admitting: Registered Nurse

## 2022-06-02 ENCOUNTER — Ambulatory Visit: Payer: Medicare Other | Admitting: Orthopedic Surgery

## 2022-06-02 ENCOUNTER — Ambulatory Visit: Payer: Medicare Other | Admitting: Rheumatology

## 2022-06-02 DIAGNOSIS — B029 Zoster without complications: Secondary | ICD-10-CM

## 2022-06-02 DIAGNOSIS — M5136 Other intervertebral disc degeneration, lumbar region: Secondary | ICD-10-CM

## 2022-06-02 DIAGNOSIS — Z8709 Personal history of other diseases of the respiratory system: Secondary | ICD-10-CM

## 2022-06-02 DIAGNOSIS — M3501 Sicca syndrome with keratoconjunctivitis: Secondary | ICD-10-CM

## 2022-06-02 DIAGNOSIS — Z8669 Personal history of other diseases of the nervous system and sense organs: Secondary | ICD-10-CM

## 2022-06-02 DIAGNOSIS — G4709 Other insomnia: Secondary | ICD-10-CM

## 2022-06-02 DIAGNOSIS — Z8679 Personal history of other diseases of the circulatory system: Secondary | ICD-10-CM

## 2022-06-02 DIAGNOSIS — Z8639 Personal history of other endocrine, nutritional and metabolic disease: Secondary | ICD-10-CM

## 2022-06-02 DIAGNOSIS — M0579 Rheumatoid arthritis with rheumatoid factor of multiple sites without organ or systems involvement: Secondary | ICD-10-CM

## 2022-06-02 DIAGNOSIS — M797 Fibromyalgia: Secondary | ICD-10-CM

## 2022-06-02 DIAGNOSIS — Z8719 Personal history of other diseases of the digestive system: Secondary | ICD-10-CM

## 2022-06-02 DIAGNOSIS — Z79899 Other long term (current) drug therapy: Secondary | ICD-10-CM

## 2022-06-02 DIAGNOSIS — Z8659 Personal history of other mental and behavioral disorders: Secondary | ICD-10-CM

## 2022-06-02 DIAGNOSIS — M170 Bilateral primary osteoarthritis of knee: Secondary | ICD-10-CM

## 2022-06-02 DIAGNOSIS — M810 Age-related osteoporosis without current pathological fracture: Secondary | ICD-10-CM

## 2022-06-08 NOTE — Progress Notes (Deleted)
Office Visit Note  Patient: Katherine Delgado             Date of Birth: November 01, 1940           MRN: [id number]             PCP: Sandrea Hughs, NP Referring: Sandrea Hughs, NP Visit Date: 06/22/2022 Occupation: '@GUAROCC'$ @  Subjective:  No chief complaint on file.   History of Present Illness: Katherine Delgado is a 81 y.o. female ***   Activities of Daily Living:  Patient reports morning stiffness for *** {minute/hour:19697}.   Patient {ACTIONS;DENIES/REPORTS:21021675::"Denies"} nocturnal pain.  Difficulty dressing/grooming: {ACTIONS;DENIES/REPORTS:21021675::"Denies"} Difficulty climbing stairs: {ACTIONS;DENIES/REPORTS:21021675::"Denies"} Difficulty getting out of chair: {ACTIONS;DENIES/REPORTS:21021675::"Denies"} Difficulty using hands for taps, buttons, cutlery, and/or writing: {ACTIONS;DENIES/REPORTS:21021675::"Denies"}  No Rheumatology ROS completed.   PMFS History:  Patient Active Problem List   Diagnosis Date Noted  . Left chronic serous otitis media 05/11/2021  . 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 Primary osteoarthritis of right knee 07/25/2019  . Wound dehiscence 08/17/2018  . Lumbar spinal stenosis 03/30/2018  . Rheumatoid arthritis involving both wrists with positive rheumatoid factor ([place]) 06/14/2016  . High risk medication use 06/14/2016  . Sjogren's syndrome ([place]) 06/14/2016  . Primary osteoarthritis of both knees 06/14/2016  . DDD (degenerative disc disease), lumbar 06/14/2016  . Hypothyroidism 06/14/2016  . Osteoporosis 06/14/2016  . Abnormality of gait 04/19/2016  . Type 2 diabetes mellitus with diabetic polyneuropathy, with long-term current use of insulin ([place]) 11/27/2015  . Diabetes mellitus without complication ([place]) 38/05/1750  . Headache(784.0) 07/31/2013  . Sinus infection 07/31/2013  . Chronic right-sided low back pain with  right-sided sciatica 03/14/2013  . Insomnia 12/06/2012  . Nausea alone 12/06/2012  . Diarrhea 12/06/2012  . Urinary incontinence, urge 12/06/2012  . Lumbosacral root lesions, not elsewhere classified 11/27/2012  . Diabetic polyneuropathy associated with type 2 diabetes mellitus ([place]) 11/27/2012  . EDEMA 05/04/2010  . YEAST INFECTION 05/03/2010  . Hyperlipidemia 05/03/2010  . DEPRESSION 05/03/2010  . History of peripheral neuropathy 05/03/2010  . Essential hypertension 05/03/2010  . ALLERGIC RHINITIS 05/03/2010  . PNEUMONIA 05/03/2010  . COPD (chronic obstructive pulmonary disease) ([place]) 05/03/2010  . GERD 05/03/2010  . Fibromyalgia 05/03/2010  . DYSPNEA 05/03/2010  . CHEST PAIN 05/03/2010    Past Medical History:  Diagnosis Date  . Abnormality of gait 04/19/2016  . Acute infective polyneuritis ([place]) 2002  . Allergic rhinitis due to pollen   . Chronic pain syndrome   . COPD (chronic obstructive pulmonary disease) (HCC)    chronic bronchitis  . Depressive disorder, not elsewhere classified   . Diabetes mellitus without complication ([place])   . Diaphragmatic hernia without mention of obstruction or gangrene   . Dyslipidemia   . Fibromyalgia   . GERD (gastroesophageal reflux disease)    with h/o esophagitis  . Guillain-Barre syndrome ([place])   . History of benign thymus tumor   . Insomnia, unspecified   . Lumbar spinal stenosis 03/30/2018   L4-5 level, severe  . Miscarriage 1962  . Mixed hyperlipidemia   . Morbid obesity ([place])   . Osteoporosis   . Pneumonia   . Polyneuropathy in diabetes(357.2)   . Rheumatoid arthritis(714.0)   . Spondylosis, lumbosacral   . Spontaneous ecchymoses   . Type II or unspecified type diabetes mellitus with peripheral circulatory disorders, uncontrolled(250.72)   . Unspecified chronic bronchitis ([place])   .  Unspecified essential hypertension   . Unspecified hypothyroidism   . Unspecified pruritic disorder   . Unspecified urinary incontinence      Family History  Problem Relation Age of Onset  . Alzheimer's disease Mother   . Heart disease Mother   . Heart disease Father   . Liver disease Father   . Cancer Brother   . Arthritis Son   . Colon polyps Brother   . Colon cancer Neg Hx   . Esophageal cancer Neg Hx   . Kidney disease Neg Hx   . Stomach cancer Neg Hx   . Rectal cancer Neg Hx    Past Surgical History:  Procedure Laterality Date  . ABDOMINAL HYSTERECTOMY  1974  . abdominal tumor  2002  . APPENDECTOMY    . APPLICATION OF A-CELL OF BACK N/A 09/26/2018   Procedure: With Acell;  Surgeon: Wallace Going, DO;  Location: [facility];  Service: Plastics;  Laterality: N/A;  . APPLICATION OF WOUND VAC N/A 09/26/2018   Procedure: Vac placement;  Surgeon: Wallace Going, DO;  Location: [facility];  Service: Plastics;  Laterality: N/A;  . [place]  . BRONCHOSCOPY  2001  . CATARACT EXTRACTION, BILATERAL  09/2019, 10/2019  . CHOLECYSTECTOMY  1984  . INCISION AND DRAINAGE OF WOUND N/A 09/26/2018   Procedure: Debridement of spine wound;  Surgeon: Wallace Going, DO;  Location: [facility];  Service: Plastics;  Laterality: N/A;  . KNEE SURGERY Bilateral 08/27/2010 (L) and 01/11/2011 (R)  . LUMBAR LAMINECTOMY/DECOMPRESSION MICRODISCECTOMY N/A 07/03/2018   Procedure: Lumbar Three to Lumbar Five Laminectomy;  Surgeon: Judith Part, MD;  Location: [facility];  Service: Neurosurgery;  Laterality: N/A;  . LUMBAR WOUND DEBRIDEMENT N/A 08/20/2018   Procedure: POSTERIOR LUMBAR SPINAL WOUND DEBRIDEMENT AND REVISION;  Surgeon: Judith Part, MD;  Location: [facility];  Service: Neurosurgery;  Laterality: N/A;  POSTERIOR LUMBAR SPINAL WOUND REVISION  . [place]  . OVARIAN CYST SURGERY  1968  . thymus tumor    . 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 TONSILLECTOMY     Social History   Social History Narrative   Walks with cane   Right handed    Caffeine use: Coffee (2 cups every morning)   Tea: sometimes   Soda: none   Immunization  History  Administered Date(s) Administered  . Influenza-Unspecified 06/10/2011  . PFIZER(Purple Top)SARS-COV-2 Vaccination 11/13/2019, 11/30/2019  . Pneumococcal Conjugate-13 07/07/2016  . Pneumococcal Polysaccharide-23 11/21/2003, 10/05/2017  . Tdap 05/28/2016     Objective: Vital Signs: There were no vitals taken for this visit.   Physical Exam   Musculoskeletal Exam: ***  CDAI Exam: CDAI Score: -- Patient Global: --; Provider Global: -- Swollen: --; Tender: -- Joint Exam 06/22/2022   No joint exam has been documented for this visit   There is currently no information documented on the homunculus. Go to the Rheumatology activity and complete the homunculus joint exam.  Investigation: No additional findings.  Imaging: No results found.  Recent Labs: Lab Results  Component Value Date   WBC 13.2 (H) 03/03/2022   HGB 14.2 03/03/2022   PLT 338 03/03/2022   NA 143 03/03/2022   K 3.7 03/03/2022   CL 102 03/03/2022   CO2 29 03/03/2022   GLUCOSE 41 (L) 03/03/2022   BUN 16 03/03/2022   CREATININE 1.03 (H) 03/03/2022   BILITOT 0.4 03/03/2022   ALKPHOS 96 01/25/2021   AST 17 03/03/2022   ALT 10 03/03/2022  PROT 6.9 03/03/2022   ALBUMIN 3.5 01/25/2021   CALCIUM 9.6 03/03/2022   GFRAA 65 02/12/2021   QFTBGOLDPLUS NEGATIVE 09/30/2021    Speciality Comments: No specialty comments available.  Procedures:  No procedures performed Allergies: Influenza vaccines, Penicillins, Statins, and Sulfamethoxazole-trimethoprim   Assessment / Plan:     Visit Diagnoses: No diagnosis found.  Orders: No orders of the defined types were placed in this encounter.  No orders of the defined types were placed in this encounter.   Face-to-face time spent with patient was *** minutes. Greater than 50% of time was spent in counseling and coordination of care.  Follow-Up Instructions: No follow-ups on file.   Earnestine Mealing, CMA  Note - This record has been created using  Editor, commissioning.  Chart creation errors have been sought, but may not always  have been located. Such creation errors do not reflect on  the standard of medical care.

## 2022-06-13 ENCOUNTER — Ambulatory Visit: Payer: Medicare Other | Admitting: Orthopedic Surgery

## 2022-06-16 ENCOUNTER — Encounter
Payer: Medicare Other | Attending: Physical Medicine and Rehabilitation | Admitting: Physical Medicine and Rehabilitation

## 2022-06-16 VITALS — BP 137/78 | HR 73

## 2022-06-16 DIAGNOSIS — M961 Postlaminectomy syndrome, not elsewhere classified: Secondary | ICD-10-CM | POA: Insufficient documentation

## 2022-06-16 DIAGNOSIS — G4701 Insomnia due to medical condition: Secondary | ICD-10-CM | POA: Insufficient documentation

## 2022-06-16 MED ORDER — OXYCODONE-ACETAMINOPHEN 10-325 MG PO TABS: 1.0000 | ORAL_TABLET | ORAL | 0 refills | Status: DC | PRN

## 2022-06-16 NOTE — Patient Instructions (Signed)
Insomnia: -Try to go outside near sunrise -Get exercise during the day.  -Turn off all devices an hour before bedtime.  -Teas that can benefit: chamomile, valerian root, Brahmi (Bacopa) -Can consider over the counter melatonin, magnesium, and/or L-theanine. Melatonin is an anti-oxidant with multiple health benefits. Magnesium is involved in greater than 300 enzymatic reactions in the body and most of us are deficient as our soil is often depleted. There are 7 different types of magnesium- Bioptemizer's is a supplement with all 7 types, and each has unique benefits. Magnesium can also help with constipation and anxiety.  -Pistachios naturally increase the production of melatonin -[employer]zy Earth bamboo bed sheets are free from toxic chemicals.  -Tart cherry juice or a tart cherry supplement can improve sleep and soreness post-workout  Foods that may reduce pain: 1) Ginger (especially studied for arthritis)- reduce leukotriene production to decrease inflammation 2) Blueberries- high in phytonutrients that decrease inflammation 3) Salmon- marine omega-3s reduce joint swelling and pain 4) Pumpkin seeds- reduce inflammation 5) dark chocolate- reduces inflammation 6) turmeric- reduces inflammation 7) tart cherries - reduce pain and stiffness 8) extra virgin olive oil - its compound olecanthal helps to block prostaglandins  9) chili peppers- can be eaten or applied topically via capsaicin 10) mint- helpful for headache, muscle aches, joint pain, and itching 11) garlic- reduces inflammation  Link to further information on diet for chronic pain: [email or web address]  

## 2022-06-16 NOTE — Progress Notes (Signed)
Subjective:    Patient ID: Katherine Delgado, female    DOB: 11-06-1940, 81 y.o.   MRN: [id number]  HPI:   Katherine Delgado is a 81 y.o. female who returns for follow-up for chronic pain and medication refill. She states her pain is located in her bilateral shoulders, lower back pain radiating into his left hip. Her current exercise regime is receiving PT/OT two days a week.  1) RUE psoriatic arthritis Katherine Delgado Morphine equivalent is 60.00  MME.  -misdiagnosed as shingles initially.  -The pain has gotten better but it still can be sever and feels like someone beat her with a hammer.  -Pain radiates up into the elbow and shoulder.  -The pain can be excruciating.  -She asks if we can add something else.  -no benefit from Qutenza.  -pain is currently worst in her elbow -radiates all the way into her hand -this has been hurting her.   2) Pneumonia She was hospitalized with pneumonia since I saw her last. She was also told she had shingles, and she is toward the end of it now. Her fingers still have decreased strength on her right side- she does note improvements. She cannot get her right thumb to elevated. She will be seeing Katherine Delgado. She has been unable to write.   3) Incontinence She has been using cranberry for her incontinence.   4) Failed back syndrome -due for refill of Percocet early next month -she hates to think that she will be in pain for the rest of her years -it continues to provide her relief -lidocaine patches help -provided with pain journal last visit -her son accompanies her today -she did obtain relief from the lidocaine patch before -combination of tylenol and Percocet is working for her -pain medicine helps somewhat.  -she would like to increase the pain medicine somewhat.  -she is 81 years and hopes she does not have to live the rest of her life in pain  5) Insomnia -not sleeping at night -this continues to be a problem -she is taking the sleeping  medication I prescribed -she feels if she can get some sleep that is great -we increased the Amitriptyline to '50mg'$  at once point -she tries to take Amitriptyline early so that she is not as groggy in the morning.  -she know that sleep is a major factor.   6) Foot fracture -she has been following up with Dr Sharol Given -her pain was worse initially but has now improved and she does not feel she needs any additional pain medication.  -she has a follow-up appointment with Dr. Sharol Given and is hoping she will be discharged soon. She will ask him whether she would benefit from PT  7) Chronic pain syndrome -she just picked up her Percocet refill on Friday.  -she feels like her diffuse aches and pains have been worsening -she has been trying to eat mostly fruits and vegetables -she would be interested in vitamin D supplementation    Pain Inventory Average Pain 8 Pain Right Now 9 My pain is sharp, burning, stabbing, and aching  In the last 24 hours, has pain interfered with the following? General activity 10 Relation with others 10 Enjoyment of life 10 What TIME of day is your pain at its worst? morning , daytime, and night Sleep (in general) Poor  Pain is worse with: walking, bending, sitting, standing, and some activites Pain improves with: rest, therapy/exercise, and medication Relief from Meds: 6  Family History  Problem  Relation Age of Onset   Alzheimer's disease Mother    Heart disease Mother    Heart disease Father    Liver disease Father    Cancer Brother    Arthritis Son    Colon polyps Brother    Colon cancer Neg Hx    Esophageal cancer Neg Hx    Kidney disease Neg Hx    Stomach cancer Neg Hx    Rectal cancer Neg Hx    Social History   Socioeconomic History   Marital status: Widowed    Spouse name: Not on file   Number of children: 2   Years of education: 14   Highest education level: Not on file  Occupational History   Occupation: Retired  Tobacco Use   Smoking  status: Former    Packs/day: 0.10    Years: 10.00    Total pack years: 1.00    Types: Cigarettes    Quit date: 12/07/1979    Years since quitting: 42.5    Passive exposure: Current   Smokeless tobacco: Never  Vaping Use   Vaping Use: Never used  Substance and Sexual Activity   Alcohol use: No    Alcohol/week: 0.0 standard drinks of alcohol   Drug use: No   Sexual activity: Not Currently  Other Topics Concern   Not on file  Social History Narrative   Walks with cane   Right handed    Caffeine use: Coffee (2 cups every morning)   Tea: sometimes   Soda: none   Social Determinants of Health   Financial Resource Strain: Medium Risk (10/05/2017)   Overall Financial Resource Strain (CARDIA)    Difficulty of Paying Living Expenses: Somewhat hard  Food Insecurity: Food Insecurity Present (10/05/2017)   Hunger Vital Sign    Worried About Running Out of Food in the Last Year: Sometimes true    Ran Out of Food in the Last Year: Sometimes true  Transportation Needs: No Transportation Needs (10/05/2017)   PRAPARE - Hydrologist (Medical): No    Lack of Transportation (Non-Medical): No  Physical Activity: Inactive (10/05/2017)   Exercise Vital Sign    Days of Exercise per Week: 0 days    Minutes of Exercise per Session: 0 min  Stress: Stress Concern Present (10/05/2017)   [place]    Feeling of Stress : Rather much  Social Connections: Moderately Isolated (10/05/2017)   Social Connection and Isolation Panel [NHANES]    Frequency of Communication with Friends and Family: More than three times a week    Frequency of Social Gatherings with Friends and Family: More than three times a week    Attends Religious Services: Never    Marine scientist or Organizations: No    Attends Archivist Meetings: Never    Marital Status: Widowed   Past Surgical History:  Procedure Laterality  Date   ABDOMINAL HYSTERECTOMY  1974   abdominal tumor  2002   APPENDECTOMY     APPLICATION OF A-CELL OF BACK N/A 09/26/2018   Procedure: With Acell;  Surgeon: Wallace Going, DO;  Location: [place];  Service: Plastics;  Laterality: N/A;   APPLICATION OF WOUND VAC N/A 09/26/2018   Procedure: Vac placement;  Surgeon: Wallace Going, DO;  Location: [place];  Service: Plastics;  Laterality: N/A;   BACK SURGERY  1982   BRONCHOSCOPY  2001   CATARACT EXTRACTION, BILATERAL  09/2019, 10/2019  CHOLECYSTECTOMY  1984   INCISION AND DRAINAGE OF WOUND N/A 09/26/2018   Procedure: Debridement of spine wound;  Surgeon: Wallace Going, DO;  Location: [place];  Service: Plastics;  Laterality: N/A;   KNEE SURGERY Bilateral 08/27/2010 (L) and 01/11/2011 (R)   LUMBAR LAMINECTOMY/DECOMPRESSION MICRODISCECTOMY N/A 07/03/2018   Procedure: Lumbar Three to Lumbar Five Laminectomy;  Surgeon: Judith Part, MD;  Location: [place];  Service: Neurosurgery;  Laterality: N/A;   LUMBAR WOUND DEBRIDEMENT N/A 08/20/2018   Procedure: POSTERIOR LUMBAR SPINAL WOUND DEBRIDEMENT AND REVISION;  Surgeon: Judith Part, MD;  Location: [place];  Service: Neurosurgery;  Laterality: N/A;  POSTERIOR LUMBAR SPINAL WOUND REVISION   miscarrage  1962   OVARIAN CYST SURGERY  1968   thymus tumor     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   TONSILLECTOMY     Past Surgical History:  Procedure Laterality Date   ABDOMINAL HYSTERECTOMY  1974   abdominal tumor  2002   APPENDECTOMY     APPLICATION OF A-CELL OF BACK N/A 09/26/2018   Procedure: With Acell;  Surgeon: Wallace Going, DO;  Location: [place];  Service: Plastics;  Laterality: N/A;   APPLICATION OF WOUND VAC N/A 09/26/2018   Procedure: Vac placement;  Surgeon: Wallace Going, DO;  Location: [place];  Service: Plastics;  Laterality: N/A;   BACK SURGERY  1982   BRONCHOSCOPY  2001   CATARACT EXTRACTION, BILATERAL  09/2019, 10/2019   CHOLECYSTECTOMY  1984   INCISION AND DRAINAGE OF  WOUND N/A 09/26/2018   Procedure: Debridement of spine wound;  Surgeon: Wallace Going, DO;  Location: [place];  Service: Plastics;  Laterality: N/A;   KNEE SURGERY Bilateral 08/27/2010 (L) and 01/11/2011 (R)   LUMBAR LAMINECTOMY/DECOMPRESSION MICRODISCECTOMY N/A 07/03/2018   Procedure: Lumbar Three to Lumbar Five Laminectomy;  Surgeon: Judith Part, MD;  Location: [place];  Service: Neurosurgery;  Laterality: N/A;   LUMBAR WOUND DEBRIDEMENT N/A 08/20/2018   Procedure: POSTERIOR LUMBAR SPINAL WOUND DEBRIDEMENT AND REVISION;  Surgeon: Judith Part, MD;  Location: [place];  Service: Neurosurgery;  Laterality: N/A;  POSTERIOR LUMBAR SPINAL WOUND REVISION   miscarrage  1962   OVARIAN CYST SURGERY  1968   thymus tumor     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   TONSILLECTOMY     Past Medical History:  Diagnosis Date   Abnormality of gait 04/19/2016   Acute infective polyneuritis ([place]) 2002   Allergic rhinitis due to pollen    Chronic pain syndrome    COPD (chronic obstructive pulmonary disease) (HCC)    chronic bronchitis   Depressive disorder, not elsewhere classified    Diabetes mellitus without complication ([place])    Diaphragmatic hernia without mention of obstruction or gangrene    Dyslipidemia    Fibromyalgia    GERD (gastroesophageal reflux disease)    with h/o esophagitis   Guillain-Barre syndrome (HCC)    History of benign thymus tumor    Insomnia, unspecified    Lumbar spinal stenosis 03/30/2018   L4-5 level, severe   Miscarriage 1962   Mixed hyperlipidemia    Morbid obesity ([place])    Osteoporosis    Pneumonia    Polyneuropathy in diabetes(357.2)    Rheumatoid arthritis(714.0)    Spondylosis, lumbosacral    Spontaneous ecchymoses    Type II or unspecified type diabetes mellitus with peripheral circulatory disorders, uncontrolled(250.72)    Unspecified chronic bronchitis (HCC)    Unspecified essential hypertension    Unspecified hypothyroidism  Unspecified pruritic disorder     Unspecified urinary incontinence    BP 137/78   Pulse 73   SpO2 95%   Opioid Risk Score:   Fall Risk Score:  `1  Depression screen [facility] 2/9     04/11/2022    8:22 AM 02/17/2022   11:22 AM 01/11/2022    9:09 AM 12/14/2021    9:15 AM 08/02/2021   10:15 AM 06/29/2021   10:03 AM 06/01/2021   10:00 AM  Depression screen PHQ 2/9  Decreased Interest 0 0 0 0 1 0 1  Down, Depressed, Hopeless 0 0 0 0 1 0 1  PHQ - 2 Score 0 0 0 0 2 0 2       Review of Systems  Constitutional: Negative.   HENT: Negative.    Eyes: Negative.   Respiratory: Negative.    Cardiovascular: Negative.   Gastrointestinal: Negative.   Endocrine: Negative.   Genitourinary: Negative.   Musculoskeletal:  Positive for back pain, gait problem and neck pain.       Pain in noth legs, pain  in right shoulder , pain in both knees , pain in both feet  Skin: Negative.   Allergic/Immunologic: Negative.   Hematological: Negative.   Psychiatric/Behavioral: Negative.         Objective:  Not performed as patient was seen via phone.     Assessment & Plan:  Failed Back Syndrome: Coninue HEP as Tolerated. Continue to Monitor. She is receiving Home Health therapy at this time, PT/OT two days a week. 03/11/2021. Prescribed lidocaine patch.  -Discussed current symptoms of pain and history of pain.  -Discussed benefits of exercise in reducing pain. -increase Percocet so she can take up to 6 tablets per day -discussed adding long acting but she defers -discussed trying Qutenza, but she did not have much benefit in the past.  -Discussed following foods that may reduce pain: 1) Ginger (especially studied for arthritis)- reduce leukotriene production to decrease inflammation 2) Blueberries- high in phytonutrients that decrease inflammation 3) Salmon- marine omega-3s reduce joint swelling and pain 4) Pumpkin seeds- reduce inflammation 5) dark chocolate- reduces inflammation 6) turmeric- reduces inflammation 7) tart cherries  - reduce pain and stiffness 8) extra virgin olive oil - its compound olecanthal helps to block prostaglandins  9) chili peppers- can be eaten or applied topically via capsaicin 10) mint- helpful for headache, muscle aches, joint pain, and itching 11) garlic- reduces inflammation  Link to further information on diet for chronic pain: http://www.randall.com/  Lumbar Radiculitis: Continue Gabapentin. Continue to monitor. Continue HEP as Tolerated.03/11/2021 Left  Greater Trochanter Bursitis: Scheduled for left hip injection with Dr Ranell Patrick.Continue to Alternate Ice and Heat Therapy. Continue current medication regimen. Continue to monitor. 03/11/2021 Bilateral Primary OA: Continue HEP as Tolerated. Continue to Monitor. 03/11/2021 Insomnia: Continue Amitriptyline to '50mg'$ . Continue to Monitor.03/11/2021. Discussed trying tart cherry juice Insomnia: -Try to go outside near sunrise -Get exercise during the day.  -Turn off all devices an hour before bedtime.  -Teas that can benefit: chamomile, valerian root, Brahmi (Bacopa) -Can consider over the counter melatonin, magnesium, and/or L-theanine. Melatonin is an anti-oxidant with multiple health benefits. Magnesium is involved in greater than 300 enzymatic reactions in the body and most of [place] are deficient as our soil is often depleted. There are 7 different types of magnesium- Bioptemizer's is a supplement with all 7 types, and each has unique benefits. Magnesium can also help with constipation and anxiety.  -Pistachios naturally increase the production of  melatonin -[employer]zy [employer] bamboo bed sheets are free from toxic chemicals.  -Tart cherry juice or a tart cherry supplement can improve sleep and soreness post-workout       Chronic Pain Syndrome: Refilled Oxycodone 10/325 mg one tablet every 6 hours as needed for pain #120. We will continue the opioid monitoring program, this consists of  regular clinic visits, examinations, urine drug screen, pill counts as well as use of [place] Controlled Substance Reporting system. Discussed the differences between oxycodone and oxycontin. Provided with a pain relief journal -will perform food sensitivity testing next visit given worsening diffuse aches and pains -commended on including mostly fruits and vegetables in her diet. -Discussed current symptoms of pain and history of pain.  -Discussed benefits of exercise in reducing pain. -Discussed following foods that may reduce pain: 1) Ginger (especially studied for arthritis)- reduce leukotriene production to decrease inflammation 2) Blueberries- high in phytonutrients that decrease inflammation 3) Salmon- marine omega-3s reduce joint swelling and pain 4) Pumpkin seeds- reduce inflammation 5) dark chocolate- reduces inflammation 6) turmeric- reduces inflammation 7) tart cherries - reduce pain and stiffness 8) extra virgin olive oil - its compound olecanthal helps to block prostaglandins  9) chili peppers- can be eaten or applied topically via capsaicin 10) mint- helpful for headache, muscle aches, joint pain, and itching 11) garlic- reduces inflammation  Link to further information on diet for chronic pain: http://www.randall.com/   Right hand decreased strength post-shingles: provided referral for occupational therapy  8. Right sided excruciating post-shingles pain -will not try Qutenza again since did not help  -commended on eating anti-inflammatory foods.   -recommended doTerra Deep Blue Essential oil and applied to area of pain today- discussed that this is made of natural plant oils. Shared by personal experience of benefit from use of this essential oil -provided a link to a pdf of Pete Escogue's musculoskeletal alignment exercises: https://www.berger.[email or web address]    -Provided with a pain relief journal and discussed that it contains foods and lifestyle tips to naturally help to improve pain. Discussed that these lifestyle strategies are also very good for health unlike some medications which can have negative side effects. Discussed that the act of keeping a journal can be therapeutic and helpful to realize patterns what helps to trigger and alleviate pain.    9) Peripheral neuropathy: -Discussed Qutenza as an option for neuropathic pain control. Discussed that this is a capsaicin patch, stronger than capsaicin cream. Discussed that it is currently approved for diabetic peripheral neuropathy and post-herpetic neuralgia, but that it has also shown benefit in treating other forms of neuropathy. Provided patient with link to site to learn more about the patch: CinemaBonus.fr. Discussed that the patch would be placed in office and benefits usually last 3 months. Discussed that unintended exposure to capsaicin can cause severe irritation of eyes, mucous membranes, respiratory tract, and skin, but that Qutenza is a local treatment and does not have the systemic side effects of other nerve medications. Discussed that there may be pain, itching, erythema, and decreased sensory function associated with the application of Qutenza. Side effects usually subside within 1 week. A cold pack of analgesic medications can help with these side effects. Blood pressure can also be increased due to pain associated with administration of the patch.   10) Foot fracture: -discussed that if she experiences a fracture in the future it is permissible for ortho to take over pain management temporarily as needed to manage acute pain -prescribed high dose vitamin D for pain  management, to promote wound healing, and to reduce fracture risk in the future -encouraged discussion of PT with ortho at follow-up

## 2022-06-17 ENCOUNTER — Other Ambulatory Visit: Payer: Self-pay | Admitting: Family

## 2022-06-22 ENCOUNTER — Ambulatory Visit: Payer: Medicare Other | Attending: Rheumatology | Admitting: Rheumatology

## 2022-06-22 DIAGNOSIS — M5136 Other intervertebral disc degeneration, lumbar region: Secondary | ICD-10-CM

## 2022-06-22 DIAGNOSIS — M3501 Sicca syndrome with keratoconjunctivitis: Secondary | ICD-10-CM

## 2022-06-22 DIAGNOSIS — G4709 Other insomnia: Secondary | ICD-10-CM

## 2022-06-22 DIAGNOSIS — Z8709 Personal history of other diseases of the respiratory system: Secondary | ICD-10-CM

## 2022-06-22 DIAGNOSIS — Z8719 Personal history of other diseases of the digestive system: Secondary | ICD-10-CM

## 2022-06-22 DIAGNOSIS — M170 Bilateral primary osteoarthritis of knee: Secondary | ICD-10-CM

## 2022-06-22 DIAGNOSIS — Z8639 Personal history of other endocrine, nutritional and metabolic disease: Secondary | ICD-10-CM

## 2022-06-22 DIAGNOSIS — Z8659 Personal history of other mental and behavioral disorders: Secondary | ICD-10-CM

## 2022-06-22 DIAGNOSIS — Z8669 Personal history of other diseases of the nervous system and sense organs: Secondary | ICD-10-CM

## 2022-06-22 DIAGNOSIS — M810 Age-related osteoporosis without current pathological fracture: Secondary | ICD-10-CM

## 2022-06-22 DIAGNOSIS — B029 Zoster without complications: Secondary | ICD-10-CM

## 2022-06-22 DIAGNOSIS — M0579 Rheumatoid arthritis with rheumatoid factor of multiple sites without organ or systems involvement: Secondary | ICD-10-CM

## 2022-06-22 DIAGNOSIS — M797 Fibromyalgia: Secondary | ICD-10-CM

## 2022-06-22 DIAGNOSIS — Z79899 Other long term (current) drug therapy: Secondary | ICD-10-CM

## 2022-06-22 DIAGNOSIS — Z8679 Personal history of other diseases of the circulatory system: Secondary | ICD-10-CM

## 2022-06-28 ENCOUNTER — Encounter: Payer: Self-pay | Admitting: Family

## 2022-06-28 ENCOUNTER — Ambulatory Visit (INDEPENDENT_AMBULATORY_CARE_PROVIDER_SITE_OTHER): Payer: Medicare Other | Admitting: Family

## 2022-06-28 VITALS — BP 130/60 | HR 69 | Temp 97.9°F | Resp 18 | Ht 66.0 in | Wt 231.8 lb

## 2022-06-28 DIAGNOSIS — R300 Dysuria: Secondary | ICD-10-CM | POA: Diagnosis not present

## 2022-06-28 DIAGNOSIS — R32 Unspecified urinary incontinence: Secondary | ICD-10-CM | POA: Diagnosis not present

## 2022-06-28 DIAGNOSIS — B372 Candidiasis of skin and nail: Secondary | ICD-10-CM

## 2022-06-28 LAB — POCT URINALYSIS DIPSTICK
Bilirubin, UA: NEGATIVE
Glucose, UA: NEGATIVE
Ketones, UA: POSITIVE
Nitrite, UA: POSITIVE
Protein, UA: POSITIVE — AB
Spec Grav, UA: 1.01 (ref 1.010–1.025)
Urobilinogen, UA: NEGATIVE E.U./dL — AB
pH, UA: 7 (ref 5.0–8.0)

## 2022-06-28 MED ORDER — CIPROFLOXACIN HCL 500 MG PO TABS: 500.0000 mg | ORAL_TABLET | Freq: Two times a day (BID) | ORAL | 0 refills | Status: AC

## 2022-06-28 MED ORDER — FLUCONAZOLE 150 MG PO TABS: ORAL_TABLET | ORAL | 0 refills | Status: DC

## 2022-06-28 NOTE — Progress Notes (Signed)
Provider: Marlowe Sax FNP-C  Vernel Langenderfer, Nelda Bucks, NP  Patient Care Team: Chozen Latulippe, Nelda Bucks, NP as PCP - General (Family Medicine) Bo Merino, MD (Rheumatology) Altheimer, Legrand Como, MD as Attending Physician (Endocrinology) Newt Minion, MD as Consulting Physician (Orthopedic Surgery)  Extended Emergency Contact Information Primary Emergency Contact: [facility] Address: [address]          [place], [place] [address] Johnnette Litter of [place] Phone: [phone] Relation: Son Secondary Emergency Contact: Marlana Latus, [place] of [place] Mobile Phone: [phone] Relation: Son  Code Status:  Full Code  Goals of care: Advanced Directive information    06/28/2022   10:44 AM  Advanced Directives  Does Patient Have a Medical Advance Directive? No  Would patient like information on creating a medical advance directive? No - Patient declined     Chief Complaint  Patient presents with   Acute Visit    Patient complains of possible UTI, and yeast infection under breast.     HPI:  Pt is a 81 y.o. female seen today for an acute visit for evaluation of possible UTI and yeast infection under the breast and abdominal folds.states has never completely cleared up.  Has used that in powder and nystatin cream but not completely relieve.Redness under the breast fold and abdominal folds recently worsen. Also complains of dysuria and incontinence. denies any fever,chills,nausea,vomiting,abdominal pain,flank pain,urgency,frequency,difficult urination or hematuria.    Past Medical History:  Diagnosis Date   Abnormality of gait 04/19/2016   Acute infective polyneuritis ([place]) 2002   Allergic rhinitis due to pollen    Chronic pain syndrome    COPD (chronic obstructive pulmonary disease) (HCC)    chronic bronchitis   Depressive disorder, not elsewhere classified    Diabetes mellitus without complication ([place])    Diaphragmatic hernia  without mention of obstruction or gangrene    Dyslipidemia    Fibromyalgia    GERD (gastroesophageal reflux disease)    with h/o esophagitis   Guillain-Barre syndrome (HCC)    History of benign thymus tumor    Insomnia, unspecified    Lumbar spinal stenosis 03/30/2018   L4-5 level, severe   Miscarriage 1962   Mixed hyperlipidemia    Morbid obesity ([place])    Osteoporosis    Pneumonia    Polyneuropathy in diabetes(357.2)    Rheumatoid arthritis(714.0)    Spondylosis, lumbosacral    Spontaneous ecchymoses    Type II or unspecified type diabetes mellitus with peripheral circulatory disorders, uncontrolled(250.72)    Unspecified chronic bronchitis (HCC)    Unspecified essential hypertension    Unspecified hypothyroidism    Unspecified pruritic disorder    Unspecified urinary incontinence    Past Surgical History:  Procedure Laterality Date   ABDOMINAL HYSTERECTOMY  1974   abdominal tumor  2002   APPENDECTOMY     APPLICATION OF A-CELL OF BACK N/A 09/26/2018   Procedure: With Acell;  Surgeon: Wallace Going, DO;  Location: [facility];  Service: Plastics;  Laterality: N/A;   APPLICATION OF WOUND VAC N/A 09/26/2018   Procedure: Vac placement;  Surgeon: Wallace Going, DO;  Location: [facility];  Service: Plastics;  Laterality: N/A;   BACK SURGERY  1982   BRONCHOSCOPY  2001   CATARACT EXTRACTION, BILATERAL  09/2019, 10/2019   CHOLECYSTECTOMY  1984   INCISION AND DRAINAGE OF WOUND N/A 09/26/2018   Procedure: Debridement of spine wound;  Surgeon: Wallace Going, DO;  Location: [facility]  OR;  Service: Clinical cytogeneticist;  Laterality: N/A;   KNEE SURGERY Bilateral 08/27/2010 (L) and 01/11/2011 (R)   LUMBAR LAMINECTOMY/DECOMPRESSION MICRODISCECTOMY N/A 07/03/2018   Procedure: Lumbar Three to Lumbar Five Laminectomy;  Surgeon: Judith Part, MD;  Location: [facility];  Service: Neurosurgery;  Laterality: N/A;   LUMBAR WOUND DEBRIDEMENT N/A 08/20/2018   Procedure: POSTERIOR LUMBAR SPINAL WOUND DEBRIDEMENT  AND REVISION;  Surgeon: Judith Part, MD;  Location: [facility];  Service: Neurosurgery;  Laterality: N/A;  POSTERIOR LUMBAR SPINAL WOUND REVISION   miscarrage  1962   OVARIAN CYST SURGERY  1968   thymus tumor     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   TONSILLECTOMY      Allergies  Allergen Reactions   Influenza Vaccines Other (See Comments)    "h/o Guillain Barre; dr's told me years ago never to take another flu shot as it could relapse Rosalee Kaufman; has to do with when vaccine being changed to H1N1 virus"   Penicillins Hives, Itching, Swelling and Rash    Has patient had a PCN reaction causing immediate rash, facial/tongue/throat swelling, SOB or lightheadedness with hypotension:Unknown Has patient had a PCN reaction causing severe rash involving mucus membranes or skin necrosis: Unknown Has patient had a PCN reaction that required hospitalization:Unknown Has patient had a PCN reaction occurring within the last 10 years: Unknown If all of the above answers are "NO", then may proceed with Cephalosporin use.    Statins Other (See Comments)    Myopathy, transaminitis   Sulfamethoxazole-Trimethoprim Itching    Outpatient Encounter Medications as of 06/28/2022  Medication Sig   amitriptyline (ELAVIL) 50 MG tablet Take 1 tablet (50 mg total) by mouth at bedtime.   amLODipine (NORVASC) 10 MG tablet TAKE 1 TABLET(10 MG) BY MOUTH DAILY   aspirin EC 81 MG tablet Take 81 mg by mouth 2 (two) times a week.   augmented betamethasone dipropionate (DIPROLENE-AF) 0.05 % cream Apply topically 2 (two) times daily as needed.   calamine lotion Apply 1 application topically 3 (three) times daily. To affected areas on right fore arm   cholecalciferol (VITAMIN D3) 25 MCG (1000 UT) tablet Take 1,000 Units by mouth daily.   Cranberry 475 MG CAPS Take 1 capsule (475 mg total) by mouth 2 (two) times daily.   docusate sodium (COLACE) 100 MG capsule Take 100 mg by mouth daily as needed for mild constipation.   DULoxetine  (CYMBALTA) 20 MG capsule TAKE 1 CAPSULE(20 MG) BY MOUTH DAILY   ezetimibe (ZETIA) 10 MG tablet Take 1 tablet (10 mg total) by mouth daily.   fluticasone (CUTIVATE) 0.05 % cream Apply topically 2 (two) times daily as needed.   folic acid (FOLVITE) 1 MG tablet Take 2 tablets (2 mg total) by mouth daily.   furosemide (LASIX) 20 MG tablet TAKE 1 BY MOUTH TWICE DAILY FOR 3 DAYS AS NEEDED FOR 3 POUND WEIGHT GAIN IN ONE DAY OR 5 POUNDS IN ONE WEEK   gabapentin (NEURONTIN) 600 MG tablet TAKE 1 TABLET(600 MG) BY MOUTH THREE TIMES DAILY   insulin lispro (HUMALOG KWIKPEN) 200 UNIT/ML KwikPen INJECT UP TO 80 UNITS DAILY AS DIRECTED   Insulin Pen Needle (B-D UF III MINI PEN NEEDLES) 31G X 5 MM MISC USE AS DIRECTED   ipratropium-albuterol (DUONEB) 0.5-2.5 (3) MG/3ML SOLN Take 3 mLs by nebulization every 6 (six) hours as needed.   ketoconazole (NIZORAL) 2 % cream Apply 1 application topically 2 (two) times daily.   levothyroxine (SYNTHROID) 100 MCG tablet Take 1  tablet (100 mcg total) by mouth daily.   lidocaine (LIDODERM) 5 % PLACE 1 PATCH ONTO THE SKIN DAILY AS DIRECTED, REMOVE AND DISCARD PATCH WITHN 12 HOURS OR AS DIRECTED BY MD   lisinopril (ZESTRIL) 20 MG tablet TAKE 1 TABLET BY MOUTH EVERY DAY   loperamide (IMODIUM A-D) 2 MG tablet Take 2 mg by mouth 4 (four) times daily as needed for diarrhea or loose stools.   Melatonin 10 MG TABS Take 10 mg by mouth at bedtime as needed (sleep).   Menthol, Topical Analgesic, (ICY HOT MEDICATED SPRAY EX) Apply 1 application topically as needed (shoulder pain).   metoprolol tartrate (LOPRESSOR) 50 MG tablet TAKE 1 TABLET BY MOUTH TWICE DAILY   Multiple Vitamins-Minerals (MULTIVITAMIN ADULT PO) Take 1 tablet by mouth daily.   MYRBETRIQ 50 MG TB24 tablet TAKE 1 TABLET(50 MG) BY MOUTH DAILY   nystatin (MYCOSTATIN/NYSTOP) powder Apply 1 application topically 3 (three) times daily.   nystatin (NYSTATIN) powder APPLY TOPICALLY THREE TIMES DAILY AS DIRECTED FOR 14 DAYS and prn  yeast infection   nystatin cream (MYCOSTATIN) Apply 1 application topically 3 (three) times daily. To skin folds   omega-3 acid ethyl esters (LOVAZA) 1 g capsule TAKE ONE CAPSULE BY MOUTH EVERY DAY   oxyCODONE-acetaminophen (PERCOCET) 10-325 MG tablet Take 1 tablet by mouth every 4 (four) hours as needed for pain. Do Not Fill Before 04/13/2022   polyvinyl alcohol (LIQUIFILM TEARS) 1.4 % ophthalmic solution Place 1 drop into both eyes 4 (four) times daily as needed (for dry/irritated eyes).    potassium chloride (MICRO-K) 10 MEQ CR capsule Take 2 capsules (20 mEq total) by mouth 2 (two) times daily as needed (when taking a dose of lasix.).   Semaglutide, 1 MG/DOSE, 4 MG/3ML SOPN Inject 1 mg as directed once a week.   sodium chloride (OCEAN) 0.65 % nasal spray Place 2 sprays into the nose as needed for congestion.   tiotropium (SPIRIVA) 18 MCG inhalation capsule Place 18 mcg into inhaler and inhale as needed.   tiZANidine (ZANAFLEX) 2 MG tablet TAKE 1 TABLET(2 MG) BY MOUTH THREE TIMES DAILY   TOUJEO SOLOSTAR 300 UNIT/ML Solostar Pen Inject 90 Units into the skin daily.   triamterene-hydrochlorothiazide (MAXZIDE-25) 37.5-25 MG tablet TAKE 1 TABLET BY MOUTH DAILY   venlafaxine XR (EFFEXOR-XR) 75 MG 24 hr capsule Take one capsule by mouth once daily with breakfast.   Vitamin D, Ergocalciferol, (DRISDOL) 1.25 MG (50000 UNIT) CAPS capsule Take 1 capsule (50,000 Units total) by mouth every 7 (seven) days.   XELJANZ 5 MG TABS TAKE 1 TABLET BY MOUTH DAILY   [DISCONTINUED] fluconazole (DIFLUCAN) 150 MG tablet Take one by mouth x 1 dose Then repeat in 1 week.   [DISCONTINUED] saccharomyces boulardii (FLORASTOR) 250 MG capsule Take 1 capsule (250 mg total) by mouth 2 (two) times daily. For 10 days.   No facility-administered encounter medications on file as of 06/28/2022.    Review of Systems  Constitutional:  Negative for appetite change, chills, fatigue and fever.  Respiratory:  Negative for cough, chest  tightness, shortness of breath and wheezing.   Cardiovascular:  Negative for chest pain, palpitations and leg swelling.  Gastrointestinal:  Negative for abdominal distention, abdominal pain, constipation, diarrhea, nausea and vomiting.  Genitourinary:  Positive for dysuria and frequency. Negative for difficulty urinating, flank pain, hematuria, pelvic pain and urgency.  Skin:  Positive for rash. Negative for color change and pallor.       Redness under breast and abdominal fold  Immunization History  Administered Date(s) Administered   Influenza-Unspecified 06/10/2011   PFIZER(Purple Top)SARS-COV-2 Vaccination 11/13/2019, 11/30/2019   Pneumococcal Conjugate-13 07/07/2016   Pneumococcal Polysaccharide-23 11/21/2003, 10/05/2017   Tdap 05/28/2016   Pertinent  Health Maintenance Due  Topic Date Due   FOOT EXAM  10/15/2021   OPHTHALMOLOGY EXAM  03/25/2022   HEMOGLOBIN A1C  09/02/2022   DEXA SCAN  09/20/2025   INFLUENZA VACCINE  Discontinued      02/17/2022   11:22 AM 03/21/2022    9:36 AM 04/11/2022    8:22 AM 06/16/2022    9:46 AM 06/28/2022   10:44 AM  Fall Risk  Falls in the past year? 0 1 1 0 0  Was there an injury with Fall? 0 0 1  0  Fall Risk Category Calculator 0 1 2  0  Fall Risk Category Low Low Moderate  Low  Patient Fall Risk Level Low fall risk  Moderate fall risk  Low fall risk  Patient at Risk for Falls Due to No Fall Risks History of fall(s);Impaired balance/gait Impaired balance/gait  No Fall Risks  Fall risk Follow up Falls evaluation completed Falls evaluation completed   Falls evaluation completed   Functional Status Survey:    Vitals:   06/28/22 1042  BP: 130/60  Pulse: 69  Resp: 18  Temp: 97.9 F (36.6 C)  SpO2: 96%  Weight: 231 lb 12.8 oz (105.1 kg)  Height: '5\' 6"'$  (1.676 m)   Body mass index is 37.41 kg/m. Physical Exam Vitals reviewed.  Constitutional:      General: She is not in acute distress.    Appearance: Normal appearance. She is  normal weight. She is not ill-appearing or diaphoretic.  HENT:     Head: Normocephalic.  Eyes:     General: No scleral icterus.       Right eye: No discharge.        Left eye: No discharge.     Conjunctiva/sclera: Conjunctivae normal.     Pupils: Pupils are equal, round, and reactive to light.  Cardiovascular:     Rate and Rhythm: Normal rate and regular rhythm.     Pulses: Normal pulses.     Heart sounds: Normal heart sounds. No murmur heard.    No friction rub. No gallop.  Pulmonary:     Effort: Pulmonary effort is normal. No respiratory distress.     Breath sounds: Normal breath sounds. No wheezing, rhonchi or rales.  Chest:     Chest wall: No tenderness.  Abdominal:     General: Bowel sounds are normal. There is no distension.     Palpations: Abdomen is soft. There is no mass.     Tenderness: There is no abdominal tenderness. There is no right CVA tenderness, left CVA tenderness, guarding or rebound.  Skin:    General: Skin is warm and dry.     Coloration: Skin is not pale.     Findings: Erythema and rash present. No bruising or lesion.     Comments: Bilateral breast and abdominal folds beefy redness noted   Neurological:     Mental Status: She is alert and oriented to person, place, and time.     Gait: Gait abnormal.  Psychiatric:        Mood and Affect: Mood normal.        Speech: Speech normal.        Behavior: Behavior normal.    Labs reviewed: Recent Labs    09/02/21 1039 09/30/21 1532 03/03/22  0850  NA 140 138 143  K 4.1 4.0 3.7  CL 103 101 102  CO2 '28 28 29  '$ GLUCOSE 57* 105* 41*  BUN 18 27* 16  CREATININE 1.02 1.06* 1.03*  CALCIUM 9.1 9.4 9.6   Recent Labs    09/30/21 1532 03/03/22 0850  AST 12 17  ALT 6 10  BILITOT 0.3 0.4  PROT 7.0 6.9   Recent Labs    09/30/21 1532 03/03/22 0850  WBC 14.7* 13.2*  NEUTROABS 10,790* 7,828*  HGB 13.6 14.2  HCT 42.4 43.7  MCV 82.3 84.7  PLT 363 338   Lab Results  Component Value Date   TSH 0.24 (L)  03/02/2022   Lab Results  Component Value Date   HGBA1C 6.4 (A) 03/02/2022   Lab Results  Component Value Date   CHOL 127 02/12/2021   HDL 17 (L) 02/12/2021   LDLCALC 76 02/12/2021   TRIG 259 (H) 02/12/2021   CHOLHDL 7.5 (H) 02/12/2021    Significant Diagnostic Results in last 30 days:  No results found.  Assessment/Plan 1. Urinary incontinence, unspecified type Has worsen  - POC Urinalysis Dipstick indicates very cloudy yellow urine positive for ketones, trace blood positive for protein nitrites and large leukocytes. Encouraged to increase water intake to at least 6 to 8 glasses daily we will send urine for culture and sensitivity patient willing to wait for 3 days for the final functions.Advised to notify provider if running any fever or symptoms worsen. - Urine Culture  2. Dysuria Afebrile Negative exam findings - POC Urinalysis Dipstick - Urine Culture  3. Candidal skin infection Bilateral breast folds and abdominal folds extensive beefy redness with strong odor.  Will await final urine culture if positive for UTI will treat with antibiotics.  Discussed with the patient to take fluconazole after completion of antibiotics. - fluconazole (DIFLUCAN) 150 MG tablet; Take one by mouth once a week x 4 weeks.  Dispense: 4 tablet; Refill: 0  -Information provided to obtain Interdry material and apply on breast fold and abdominal folds to prevent development of candidiasis.  Family/ staff Communication: Reviewed plan of care with patient verbalized understanding  Labs/tests ordered: None   Next Appointment: Return in about 2 months (around 08/28/2022) for medical mangement of chronic issues.Sandrea Hughs, NP

## 2022-06-29 DIAGNOSIS — E1142 Type 2 diabetes mellitus with diabetic polyneuropathy: Secondary | ICD-10-CM | POA: Diagnosis not present

## 2022-06-29 DIAGNOSIS — Z794 Long term (current) use of insulin: Secondary | ICD-10-CM | POA: Diagnosis not present

## 2022-07-02 LAB — URINE CULTURE
MICRO NUMBER:: 14155744
SPECIMEN QUALITY:: ADEQUATE

## 2022-07-04 ENCOUNTER — Other Ambulatory Visit: Payer: Self-pay

## 2022-07-04 ENCOUNTER — Other Ambulatory Visit: Payer: Self-pay | Admitting: Family

## 2022-07-04 NOTE — Telephone Encounter (Signed)
Pharmacy requested refill.  ?Pended Rx and sent to [facility] for approval.  ?

## 2022-07-11 ENCOUNTER — Telehealth: Payer: Self-pay | Admitting: Physical Medicine and Rehabilitation

## 2022-07-11 NOTE — Telephone Encounter (Signed)
Patient is requesting refill for percocet. Current pharmacy does not have in and would like to be sent elsewhere.She asks our office to locate another pharmacy.  Please call patient.

## 2022-07-12 ENCOUNTER — Other Ambulatory Visit: Payer: Self-pay

## 2022-07-12 ENCOUNTER — Encounter: Payer: Self-pay | Admitting: Family

## 2022-07-12 ENCOUNTER — Ambulatory Visit (INDEPENDENT_AMBULATORY_CARE_PROVIDER_SITE_OTHER): Payer: Medicare Other | Admitting: Family

## 2022-07-12 ENCOUNTER — Telehealth: Payer: Self-pay | Admitting: Registered Nurse

## 2022-07-12 VITALS — BP 118/60 | HR 73 | Temp 97.1°F | Ht 66.0 in | Wt 222.0 lb

## 2022-07-12 DIAGNOSIS — R300 Dysuria: Secondary | ICD-10-CM | POA: Diagnosis not present

## 2022-07-12 DIAGNOSIS — J029 Acute pharyngitis, unspecified: Secondary | ICD-10-CM

## 2022-07-12 DIAGNOSIS — R051 Acute cough: Secondary | ICD-10-CM

## 2022-07-12 DIAGNOSIS — N390 Urinary tract infection, site not specified: Secondary | ICD-10-CM | POA: Diagnosis not present

## 2022-07-12 DIAGNOSIS — R32 Unspecified urinary incontinence: Secondary | ICD-10-CM

## 2022-07-12 LAB — POC COVID19 BINAXNOW: SARS Coronavirus 2 Ag: NEGATIVE

## 2022-07-12 LAB — POCT URINALYSIS DIPSTICK
Glucose, UA: NEGATIVE
Ketones, UA: NEGATIVE
Nitrite, UA: NEGATIVE
Protein, UA: POSITIVE — AB
Spec Grav, UA: >=1.03 — AB (ref 1.010–1.025)
Urobilinogen, UA: 0.2 E.U./dL
pH, UA: 5 (ref 5.0–8.0)

## 2022-07-12 MED ORDER — OXYCODONE-ACETAMINOPHEN 10-325 MG PO TABS: 1.0000 | ORAL_TABLET | Freq: Four times a day (QID) | ORAL | 0 refills | Status: DC | PRN

## 2022-07-12 MED ORDER — AZITHROMYCIN 250 MG PO TABS: ORAL_TABLET | ORAL | 0 refills | Status: DC

## 2022-07-12 NOTE — Progress Notes (Signed)
Provider: Marlowe Sax FNP-C  Simrin Vegh, Nelda Bucks, NP  Patient Care Team: Dannah Ryles, Nelda Bucks, NP as PCP - General (Family Medicine) Bo Merino, MD (Rheumatology) Altheimer, Legrand Como, MD as Attending Physician (Endocrinology) Newt Minion, MD as Consulting Physician (Orthopedic Surgery)  Extended Emergency Contact Information Primary Emergency Contact: [facility] Address: [address]          [place], [place] [address] Johnnette Litter of [place] Phone: [phone] Relation: Son Secondary Emergency Contact: Marlana Latus, [place] of [place] Mobile Phone: [phone] Relation: Son  Code Status:  Full code  Goals of care: Advanced Directive information    06/28/2022   10:44 AM  Advanced Directives  Does Patient Have a Medical Advance Directive? No  Would patient like information on creating a medical advance directive? No - Patient declined     Chief Complaint  Patient presents with   Follow-up    Patient complains of UTI not curing and now having URI symptoms. Such as sneezing, coughing.    HPI:  Pt is a 81 y.o. female seen today for an acute visit for evaluation of cough,congestion,runny nose and sneezing.last week had aches and chills.Temp 99.0 at home.unable to cough up anything.Has no energy for anything.Has taken Tylenol and other OTC cough medication.Has not been around anyone who has been sick.Has not been out of the hours for the past 2 weeks. She denies any chest tightness,chest pain,palpitation or shortness of breath.  Also states previous urinary tract infection not cured.states has pain with urination and burning.  Yeast infection has improved.still on diflucan  and using nystatin cream.Has not obtained interdry moisture - wicking fabric as advised to apply on breast and abdominal fold to prevent frequent yeast infection.  She was brought by son but not present during exam states out in the lobby.   Past  Medical History:  Diagnosis Date   Abnormality of gait 04/19/2016   Acute infective polyneuritis ([place]) 2002   Allergic rhinitis due to pollen    Chronic pain syndrome    COPD (chronic obstructive pulmonary disease) (HCC)    chronic bronchitis   Depressive disorder, not elsewhere classified    Diabetes mellitus without complication ([place])    Diaphragmatic hernia without mention of obstruction or gangrene    Dyslipidemia    Fibromyalgia    GERD (gastroesophageal reflux disease)    with h/o esophagitis   Guillain-Barre syndrome (HCC)    History of benign thymus tumor    Insomnia, unspecified    Lumbar spinal stenosis 03/30/2018   L4-5 level, severe   Miscarriage 1962   Mixed hyperlipidemia    Morbid obesity ([place])    Osteoporosis    Pneumonia    Polyneuropathy in diabetes(357.2)    Rheumatoid arthritis(714.0)    Spondylosis, lumbosacral    Spontaneous ecchymoses    Type II or unspecified type diabetes mellitus with peripheral circulatory disorders, uncontrolled(250.72)    Unspecified chronic bronchitis (HCC)    Unspecified essential hypertension    Unspecified hypothyroidism    Unspecified pruritic disorder    Unspecified urinary incontinence    Past Surgical History:  Procedure Laterality Date   ABDOMINAL HYSTERECTOMY  1974   abdominal tumor  2002   APPENDECTOMY     APPLICATION OF A-CELL OF BACK N/A 09/26/2018   Procedure: With Acell;  Surgeon: Wallace Going, DO;  Location: [facility];  Service: Plastics;  Laterality: N/A;   APPLICATION OF WOUND VAC N/A 09/26/2018  Procedure: Vac placement;  Surgeon: Wallace Going, DO;  Location: [facility];  Service: Plastics;  Laterality: N/A;   BACK SURGERY  1982   BRONCHOSCOPY  2001   CATARACT EXTRACTION, BILATERAL  09/2019, 10/2019   CHOLECYSTECTOMY  1984   INCISION AND DRAINAGE OF WOUND N/A 09/26/2018   Procedure: Debridement of spine wound;  Surgeon: Wallace Going, DO;  Location: [facility];  Service: Plastics;  Laterality: N/A;    KNEE SURGERY Bilateral 08/27/2010 (L) and 01/11/2011 (R)   LUMBAR LAMINECTOMY/DECOMPRESSION MICRODISCECTOMY N/A 07/03/2018   Procedure: Lumbar Three to Lumbar Five Laminectomy;  Surgeon: Judith Part, MD;  Location: [facility];  Service: Neurosurgery;  Laterality: N/A;   LUMBAR WOUND DEBRIDEMENT N/A 08/20/2018   Procedure: POSTERIOR LUMBAR SPINAL WOUND DEBRIDEMENT AND REVISION;  Surgeon: Judith Part, MD;  Location: [facility];  Service: Neurosurgery;  Laterality: N/A;  POSTERIOR LUMBAR SPINAL WOUND REVISION   miscarrage  1962   OVARIAN CYST SURGERY  1968   thymus tumor     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   TONSILLECTOMY      Allergies  Allergen Reactions   Influenza Vaccines Other (See Comments)    "h/o Guillain Barre; dr's told me years ago never to take another flu shot as it could relapse Rosalee Kaufman; has to do with when vaccine being changed to H1N1 virus"   Penicillins Hives, Itching, Swelling and Rash    Has patient had a PCN reaction causing immediate rash, facial/tongue/throat swelling, SOB or lightheadedness with hypotension:Unknown Has patient had a PCN reaction causing severe rash involving mucus membranes or skin necrosis: Unknown Has patient had a PCN reaction that required hospitalization:Unknown Has patient had a PCN reaction occurring within the last 10 years: Unknown If all of the above answers are "NO", then may proceed with Cephalosporin use.    Statins Other (See Comments)    Myopathy, transaminitis   Sulfamethoxazole-Trimethoprim Itching    Outpatient Encounter Medications as of 07/12/2022  Medication Sig   amitriptyline (ELAVIL) 50 MG tablet Take 1 tablet (50 mg total) by mouth at bedtime.   amLODipine (NORVASC) 10 MG tablet TAKE 1 TABLET(10 MG) BY MOUTH DAILY   aspirin EC 81 MG tablet Take 81 mg by mouth 2 (two) times a week.   augmented betamethasone dipropionate (DIPROLENE-AF) 0.05 % cream Apply topically 2 (two) times daily as needed.   calamine lotion Apply  1 application topically 3 (three) times daily. To affected areas on right fore arm   cholecalciferol (VITAMIN D3) 25 MCG (1000 UT) tablet Take 1,000 Units by mouth daily.   Cranberry 475 MG CAPS Take 1 capsule (475 mg total) by mouth 2 (two) times daily.   docusate sodium (COLACE) 100 MG capsule Take 100 mg by mouth daily as needed for mild constipation.   DULoxetine (CYMBALTA) 20 MG capsule TAKE 1 CAPSULE(20 MG) BY MOUTH DAILY   ezetimibe (ZETIA) 10 MG tablet Take 1 tablet (10 mg total) by mouth daily.   fluconazole (DIFLUCAN) 150 MG tablet Take one by mouth once a week x 4 weeks.   fluticasone (CUTIVATE) 0.05 % cream Apply topically 2 (two) times daily as needed.   folic acid (FOLVITE) 1 MG tablet Take 2 tablets (2 mg total) by mouth daily.   furosemide (LASIX) 20 MG tablet TAKE 1 BY MOUTH TWICE DAILY FOR 3 DAYS AS NEEDED FOR 3 POUND WEIGHT GAIN IN ONE DAY OR 5 POUNDS IN ONE WEEK   gabapentin (NEURONTIN) 600 MG tablet TAKE 1 TABLET(600  MG) BY MOUTH THREE TIMES DAILY   insulin lispro (HUMALOG KWIKPEN) 200 UNIT/ML KwikPen INJECT UP TO 80 UNITS DAILY AS DIRECTED   Insulin Pen Needle (B-D UF III MINI PEN NEEDLES) 31G X 5 MM MISC USE AS DIRECTED   ipratropium-albuterol (DUONEB) 0.5-2.5 (3) MG/3ML SOLN Take 3 mLs by nebulization every 6 (six) hours as needed.   ketoconazole (NIZORAL) 2 % cream Apply 1 application topically 2 (two) times daily.   levothyroxine (SYNTHROID) 100 MCG tablet Take 1 tablet (100 mcg total) by mouth daily.   lidocaine (LIDODERM) 5 % PLACE 1 PATCH ONTO THE SKIN DAILY AS DIRECTED, REMOVE AND DISCARD PATCH WITHN 12 HOURS OR AS DIRECTED BY MD   lisinopril (ZESTRIL) 20 MG tablet TAKE 1 TABLET BY MOUTH EVERY DAY   loperamide (IMODIUM A-D) 2 MG tablet Take 2 mg by mouth 4 (four) times daily as needed for diarrhea or loose stools.   Melatonin 10 MG TABS Take 10 mg by mouth at bedtime as needed (sleep).   Menthol, Topical Analgesic, (ICY HOT MEDICATED SPRAY EX) Apply 1 application  topically as needed (shoulder pain).   metoprolol tartrate (LOPRESSOR) 50 MG tablet TAKE 1 TABLET BY MOUTH TWICE DAILY   Multiple Vitamins-Minerals (MULTIVITAMIN ADULT PO) Take 1 tablet by mouth daily.   MYRBETRIQ 50 MG TB24 tablet TAKE 1 TABLET(50 MG) BY MOUTH DAILY   nystatin (MYCOSTATIN/NYSTOP) powder Apply 1 application topically 3 (three) times daily.   nystatin (NYSTATIN) powder APPLY TOPICALLY THREE TIMES DAILY AS DIRECTED FOR 14 DAYS and prn yeast infection   nystatin cream (MYCOSTATIN) Apply 1 application topically 3 (three) times daily. To skin folds   omega-3 acid ethyl esters (LOVAZA) 1 g capsule TAKE ONE CAPSULE BY MOUTH EVERY DAY   oxyCODONE-acetaminophen (PERCOCET) 10-325 MG tablet Take 1 tablet by mouth every 4 (four) hours as needed for pain. Do Not Fill Before 04/13/2022   Phenyleph-Doxylamine-DM-APAP (ALKA-SELTZER PLS ALLERGY & [facility] PO) Take by mouth.   polyvinyl alcohol (LIQUIFILM TEARS) 1.4 % ophthalmic solution Place 1 drop into both eyes 4 (four) times daily as needed (for dry/irritated eyes).    potassium chloride (MICRO-K) 10 MEQ CR capsule Take 2 capsules (20 mEq total) by mouth 2 (two) times daily as needed (when taking a dose of lasix.).   Semaglutide, 1 MG/DOSE, 4 MG/3ML SOPN Inject 1 mg as directed once a week.   sodium chloride (OCEAN) 0.65 % nasal spray Place 2 sprays into the nose as needed for congestion.   tiotropium (SPIRIVA) 18 MCG inhalation capsule Place 18 mcg into inhaler and inhale as needed.   tiZANidine (ZANAFLEX) 2 MG tablet TAKE 1 TABLET(2 MG) BY MOUTH THREE TIMES DAILY   TOUJEO SOLOSTAR 300 UNIT/ML Solostar Pen Inject 90 Units into the skin daily.   triamterene-hydrochlorothiazide (MAXZIDE-25) 37.5-25 MG tablet TAKE 1 TABLET BY MOUTH DAILY   venlafaxine XR (EFFEXOR-XR) 75 MG 24 hr capsule Take one capsule by mouth once daily with breakfast.   Vitamin D, Ergocalciferol, (DRISDOL) 1.25 MG (50000 UNIT) CAPS capsule Take 1 capsule (50,000 Units total) by  mouth every 7 (seven) days.   XELJANZ 5 MG TABS TAKE 1 TABLET BY MOUTH DAILY   No facility-administered encounter medications on file as of 07/12/2022.    Review of Systems  Constitutional:  Positive for chills and fatigue. Negative for appetite change, fever and unexpected weight change.  HENT:  Positive for congestion, rhinorrhea and sore throat. Negative for dental problem, ear discharge, ear pain, facial swelling, hearing loss, nosebleeds,  postnasal drip, sinus pressure, sinus pain, sneezing, tinnitus and trouble swallowing.   Eyes:  Negative for pain, discharge, redness, itching and visual disturbance.  Respiratory:  Positive for cough. Negative for chest tightness, shortness of breath and wheezing.   Cardiovascular:  Negative for chest pain, palpitations and leg swelling.  Gastrointestinal:  Negative for abdominal distention, abdominal pain, nausea and vomiting.  Endocrine: Negative for cold intolerance, heat intolerance, polydipsia, polyphagia and polyuria.  Genitourinary:  Positive for dysuria. Negative for difficulty urinating, flank pain, frequency and urgency.       Urine incontinence   Musculoskeletal:  Positive for arthralgias, back pain and gait problem.  Skin:  Negative for color change, pallor and rash.       Previous yeast infection on breast and abdominal folds has improved but not resolved.  Psychiatric/Behavioral:  Negative for agitation, behavioral problems, confusion, hallucinations and sleep disturbance. The patient is not nervous/anxious.     Immunization History  Administered Date(s) Administered   Influenza-Unspecified 06/10/2011   PFIZER(Purple Top)SARS-COV-2 Vaccination 11/13/2019, 11/30/2019   Pneumococcal Conjugate-13 07/07/2016   Pneumococcal Polysaccharide-23 11/21/2003, 10/05/2017   Tdap 05/28/2016   Pertinent  Health Maintenance Due  Topic Date Due   FOOT EXAM  10/15/2021   OPHTHALMOLOGY EXAM  03/25/2022   HEMOGLOBIN A1C  09/02/2022   DEXA SCAN   09/20/2025   INFLUENZA VACCINE  Discontinued      02/17/2022   11:22 AM 03/21/2022    9:36 AM 04/11/2022    8:22 AM 06/16/2022    9:46 AM 06/28/2022   10:44 AM  Fall Risk  Falls in the past year? 0 1 1 0 0  Was there an injury with Fall? 0 0 1  0  Fall Risk Category Calculator 0 1 2  0  Fall Risk Category Low Low Moderate  Low  Patient Fall Risk Level Low fall risk  Moderate fall risk  Low fall risk  Patient at Risk for Falls Due to No Fall Risks History of fall(s);Impaired balance/gait Impaired balance/gait  No Fall Risks  Fall risk Follow up Falls evaluation completed Falls evaluation completed   Falls evaluation completed   Functional Status Survey:    Vitals:   07/12/22 0951  BP: 118/60  Pulse: 73  Temp: (!) 97.1 F (36.2 C)  TempSrc: Temporal  SpO2: 98%  Weight: 222 lb (100.7 kg)  Height: '5\' 6"'$  (1.676 m)   Body mass index is 35.83 kg/m. Physical Exam Vitals reviewed.  Constitutional:      General: She is not in acute distress.    Appearance: Normal appearance. She is obese. She is not ill-appearing or diaphoretic.  HENT:     Head: Normocephalic.     Right Ear: Tympanic membrane, ear canal and external ear normal. There is no impacted cerumen.     Left Ear: Tympanic membrane, ear canal and external ear normal. There is no impacted cerumen.     Nose: Nose normal. No congestion or rhinorrhea.     Mouth/Throat:     Mouth: Mucous membranes are moist.     Pharynx: Oropharynx is clear. Posterior oropharyngeal erythema present. No oropharyngeal exudate.  Eyes:     General: No scleral icterus.       Right eye: No discharge.        Left eye: No discharge.     Conjunctiva/sclera: Conjunctivae normal.     Pupils: Pupils are equal, round, and reactive to light.  Neck:     Vascular: No carotid bruit.  Cardiovascular:  Rate and Rhythm: Normal rate and regular rhythm.     Pulses: Normal pulses.     Heart sounds: Normal heart sounds. No murmur heard.    No friction rub.  No gallop.  Pulmonary:     Effort: Pulmonary effort is normal. No respiratory distress.     Breath sounds: Normal breath sounds. No wheezing, rhonchi or rales.  Chest:     Chest wall: No tenderness.  Abdominal:     General: Bowel sounds are normal. There is no distension.     Palpations: Abdomen is soft. There is no mass.     Tenderness: There is no abdominal tenderness. There is no right CVA tenderness, left CVA tenderness, guarding or rebound.  Musculoskeletal:        General: No swelling or tenderness. Normal range of motion.     Cervical back: Normal range of motion. No rigidity or tenderness.     Right lower leg: No edema.     Left lower leg: No edema.  Lymphadenopathy:     Cervical: No cervical adenopathy.  Skin:    General: Skin is warm and dry.     Coloration: Skin is not pale.     Findings: No bruising, erythema, lesion or rash.  Neurological:     Mental Status: She is alert and oriented to person, place, and time.     Motor: No weakness.     Gait: Gait abnormal.  Psychiatric:        Mood and Affect: Mood normal.        Speech: Speech normal.        Behavior: Behavior normal.        Thought Content: Thought content normal.        Judgment: Judgment normal.     Labs reviewed: Recent Labs    09/02/21 1039 09/30/21 1532 03/03/22 0850  NA 140 138 143  K 4.1 4.0 3.7  CL 103 101 102  CO2 '28 28 29  '$ GLUCOSE 57* 105* 41*  BUN 18 27* 16  CREATININE 1.02 1.06* 1.03*  CALCIUM 9.1 9.4 9.6   Recent Labs    09/30/21 1532 03/03/22 0850  AST 12 17  ALT 6 10  BILITOT 0.3 0.4  PROT 7.0 6.9   Recent Labs    09/30/21 1532 03/03/22 0850  WBC 14.7* 13.2*  NEUTROABS 10,790* 7,828*  HGB 13.6 14.2  HCT 42.4 43.7  MCV 82.3 84.7  PLT 363 338   Lab Results  Component Value Date   TSH 0.24 (L) 03/02/2022   Lab Results  Component Value Date   HGBA1C 6.4 (A) 03/02/2022   Lab Results  Component Value Date   CHOL 127 02/12/2021   HDL 17 (L) 02/12/2021   LDLCALC  76 02/12/2021   TRIG 259 (H) 02/12/2021   CHOLHDL 7.5 (H) 02/12/2021    Significant Diagnostic Results in last 30 days:  No results found.  Assessment/Plan 1. Dysuria Afebrile  - POC Urinalysis Di clear urine  pstick indicates dark yellow- Urine Culture with trace of blood, protein and moderate leukocytes 2+ but negative for nitrites. - encouraged to increase fluid intake  - dicussed sending urine for culture and sensitivity then will treat with antibiotics - Advised to notify provider if symptoms worsen   2. Frequent UTI Recently treat for UTI 2 weeks ago.refer to urology for further evaluation.  - Ambulatory referral to Urology  3. Urinary incontinence, unspecified type Recommend follow up with urology due to increase urine frequency and  UTI's  - Ambulatory referral to Urology  4. Acute cough - OTC Mucinex -DM recommended  - POC COVID-19  5. Acute pharyngitis, unspecified etiology Posterior pharynx erythema without exudate noted with nasal congestion. - start on Z-pak as below.Has taken Z-pak in the past tolerated well.  - azithromycin (ZITHROMAX) 250 MG tablet; Take 2 tablets (500 mg ) by mouth x 1 dose then one tablet ( 250 mg ) by mouth daily x 4 days  Dispense: 6 tablet; Refill: 0   Family/ staff Communication: Reviewed plan of care with patient verbalized understanding   Labs/tests ordered: None   Next Appointment: Return if symptoms worsen or fail to improve.   Sandrea Hughs, NP

## 2022-07-12 NOTE — Telephone Encounter (Signed)
PMP was Reviewed.  Oxycodone e-scribed today.  Katherine Delgado is aware of the above.

## 2022-07-12 NOTE — Patient Instructions (Signed)
-   increase fluid intake/soup /tea  - take cranberry 475 mg tablet one by mouth daily   - Notify provider if symptoms worsen or fail to improve

## 2022-07-12 NOTE — Telephone Encounter (Signed)
This has been handled.

## 2022-07-13 ENCOUNTER — Other Ambulatory Visit: Payer: Self-pay | Admitting: Physician Assistant

## 2022-07-14 LAB — URINE CULTURE
MICRO NUMBER:: 14222728
SPECIMEN QUALITY:: ADEQUATE

## 2022-07-18 ENCOUNTER — Encounter: Payer: Medicare Other | Attending: Physical Medicine and Rehabilitation | Admitting: Registered Nurse

## 2022-07-18 VITALS — BP 130/72 | HR 60 | Ht 66.0 in

## 2022-07-18 DIAGNOSIS — M2550 Pain in unspecified joint: Secondary | ICD-10-CM

## 2022-07-18 DIAGNOSIS — M542 Cervicalgia: Secondary | ICD-10-CM

## 2022-07-18 DIAGNOSIS — M25512 Pain in left shoulder: Secondary | ICD-10-CM | POA: Insufficient documentation

## 2022-07-18 DIAGNOSIS — G894 Chronic pain syndrome: Secondary | ICD-10-CM | POA: Diagnosis not present

## 2022-07-18 DIAGNOSIS — Z79891 Long term (current) use of opiate analgesic: Secondary | ICD-10-CM

## 2022-07-18 DIAGNOSIS — M545 Low back pain, unspecified: Secondary | ICD-10-CM | POA: Diagnosis present

## 2022-07-18 DIAGNOSIS — M961 Postlaminectomy syndrome, not elsewhere classified: Secondary | ICD-10-CM | POA: Diagnosis not present

## 2022-07-18 DIAGNOSIS — G6289 Other specified polyneuropathies: Secondary | ICD-10-CM

## 2022-07-18 DIAGNOSIS — Z5181 Encounter for therapeutic drug level monitoring: Secondary | ICD-10-CM

## 2022-07-18 DIAGNOSIS — M170 Bilateral primary osteoarthritis of knee: Secondary | ICD-10-CM

## 2022-07-18 DIAGNOSIS — M5412 Radiculopathy, cervical region: Secondary | ICD-10-CM | POA: Diagnosis present

## 2022-07-18 DIAGNOSIS — M25511 Pain in right shoulder: Secondary | ICD-10-CM | POA: Insufficient documentation

## 2022-07-18 DIAGNOSIS — G8929 Other chronic pain: Secondary | ICD-10-CM | POA: Diagnosis present

## 2022-07-18 NOTE — Progress Notes (Unsigned)
Subjective:    Patient ID: Katherine Delgado, female    DOB: 04-12-1941, 81 y.o.   MRN: [id number]  HPI: Katherine Delgado is a 81 y.o. female who returns for follow up appointment for chronic pain and medication refill. She states her pain is located in her neck radiating into her bilateral shoulders, lower back pain  and bilateral knee pain. Also reports generalized joint pain. She rates her pain 8. Her current exercise regime is walking short distances with walker in the home.  Ms. Lampson Morphine equivalent is 60.00 MME.   UDS ordered today.    Pain Inventory Average Pain 8 Pain Right Now 8 My pain is sharp, burning, stabbing, tingling, and aching  In the last 24 hours, has pain interfered with the following? General activity 10 Relation with others 10 Enjoyment of life 10 What TIME of day is your pain at its worst? morning  and night Sleep (in general) Poor  Pain is worse with: walking, bending, sitting, and standing Pain improves with: rest and medication Relief from Meds: 4  Family History  Problem Relation Age of Onset  . Alzheimer's disease Mother   . Heart disease Mother   . Heart disease Father   . Liver disease Father   . Cancer Brother   . Arthritis Son   . Colon polyps Brother   . Colon cancer Neg Hx   . Esophageal cancer Neg Hx   . Kidney disease Neg Hx   . Stomach cancer Neg Hx   . Rectal cancer Neg Hx    Social History   Socioeconomic History  . Marital status: Widowed    Spouse name: Not on file  . Number of children: 2  . Years of education: 44  . Highest education level: Not on file  Occupational History  . Occupation: Retired  Tobacco Use  . Smoking status: Former    Packs/day: 0.10    Years: 10.00    Total pack years: 1.00    Types: Cigarettes    Quit date: 12/07/1979    Years since quitting: 42.6    Passive exposure: Current  . Smokeless tobacco: Never  Vaping Use  . Vaping Use: Never used  Substance and Sexual Activity  . Alcohol use: No     Alcohol/week: 0.0 standard drinks of alcohol  . Drug use: No  . Sexual activity: Not Currently  Other Topics Concern  . Not on file  Social History Narrative   Walks with cane   Right handed    Caffeine use: Coffee (2 cups every morning)   Tea: sometimes   Soda: none   Social Determinants of Health   Financial Resource Strain: Medium Risk (10/05/2017)   Overall Financial Resource Strain (CARDIA)   . Difficulty of Paying Living Expenses: Somewhat hard  Food Insecurity: Food Insecurity Present (10/05/2017)   Hunger Vital Sign   . Worried About Charity fundraiser in the Last Year: Sometimes true   . Ran Out of Food in the Last Year: Sometimes true  Transportation Needs: No Transportation Needs (10/05/2017)   PRAPARE - Transportation   . Lack of Transportation (Medical): No   . Lack of Transportation (Non-Medical): No  Physical Activity: Inactive (10/05/2017)   Exercise Vital Sign   . Days of Exercise per Week: 0 days   . Minutes of Exercise per Session: 0 min  Stress: Stress Concern Present (10/05/2017)   [place]   . Feeling  of Stress : Rather much  Social Connections: Moderately Isolated (10/05/2017)   Social Connection and Isolation Panel [NHANES]   . Frequency of Communication with Friends and Family: More than three times a week   . Frequency of Social Gatherings with Friends and Family: More than three times a week   . Attends Religious Services: Never   . Active Member of Clubs or Organizations: No   . Attends Archivist Meetings: Never   . Marital Status: Widowed   Past Surgical History:  Procedure Laterality Date  . ABDOMINAL HYSTERECTOMY  1974  . abdominal tumor  2002  . APPENDECTOMY    . APPLICATION OF A-CELL OF BACK N/A 09/26/2018   Procedure: With Acell;  Surgeon: Wallace Going, DO;  Location: [place];  Service: Plastics;  Laterality: N/A;  . APPLICATION OF WOUND VAC N/A 09/26/2018    Procedure: Vac placement;  Surgeon: Wallace Going, DO;  Location: [place];  Service: Plastics;  Laterality: N/A;  . [place]  . BRONCHOSCOPY  2001  . CATARACT EXTRACTION, BILATERAL  09/2019, 10/2019  . CHOLECYSTECTOMY  1984  . INCISION AND DRAINAGE OF WOUND N/A 09/26/2018   Procedure: Debridement of spine wound;  Surgeon: Wallace Going, DO;  Location: [place];  Service: Plastics;  Laterality: N/A;  . KNEE SURGERY Bilateral 08/27/2010 (L) and 01/11/2011 (R)  . LUMBAR LAMINECTOMY/DECOMPRESSION MICRODISCECTOMY N/A 07/03/2018   Procedure: Lumbar Three to Lumbar Five Laminectomy;  Surgeon: Judith Part, MD;  Location: [place];  Service: Neurosurgery;  Laterality: N/A;  . LUMBAR WOUND DEBRIDEMENT N/A 08/20/2018   Procedure: POSTERIOR LUMBAR SPINAL WOUND DEBRIDEMENT AND REVISION;  Surgeon: Judith Part, MD;  Location: [place];  Service: Neurosurgery;  Laterality: N/A;  POSTERIOR LUMBAR SPINAL WOUND REVISION  . [place]  . OVARIAN CYST SURGERY  1968  . thymus tumor    . 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 TONSILLECTOMY     Past Surgical History:  Procedure Laterality Date  . ABDOMINAL HYSTERECTOMY  1974  . abdominal tumor  2002  . APPENDECTOMY    . APPLICATION OF A-CELL OF BACK N/A 09/26/2018   Procedure: With Acell;  Surgeon: Wallace Going, DO;  Location: [place];  Service: Plastics;  Laterality: N/A;  . APPLICATION OF WOUND VAC N/A 09/26/2018   Procedure: Vac placement;  Surgeon: Wallace Going, DO;  Location: [place];  Service: Plastics;  Laterality: N/A;  . [place]  . BRONCHOSCOPY  2001  . CATARACT EXTRACTION, BILATERAL  09/2019, 10/2019  . CHOLECYSTECTOMY  1984  . INCISION AND DRAINAGE OF WOUND N/A 09/26/2018   Procedure: Debridement of spine wound;  Surgeon: Wallace Going, DO;  Location: [facility];  Service: Plastics;  Laterality: N/A;  . KNEE SURGERY Bilateral 08/27/2010 (L) and 01/11/2011 (R)  . LUMBAR LAMINECTOMY/DECOMPRESSION  MICRODISCECTOMY N/A 07/03/2018   Procedure: Lumbar Three to Lumbar Five Laminectomy;  Surgeon: Judith Part, MD;  Location: [place];  Service: Neurosurgery;  Laterality: N/A;  . LUMBAR WOUND DEBRIDEMENT N/A 08/20/2018   Procedure: POSTERIOR LUMBAR SPINAL WOUND DEBRIDEMENT AND REVISION;  Surgeon: Judith Part, MD;  Location: [facility];  Service: Neurosurgery;  Laterality: N/A;  POSTERIOR LUMBAR SPINAL WOUND REVISION  . [place]  . OVARIAN CYST SURGERY  1968  . thymus tumor    . 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 TONSILLECTOMY     Past Medical History:  Diagnosis Date  . Abnormality of  gait 04/19/2016  . Acute infective polyneuritis ([place]) 2002  . Allergic rhinitis due to pollen   . Chronic pain syndrome   . COPD (chronic obstructive pulmonary disease) (HCC)    chronic bronchitis  . Depressive disorder, not elsewhere classified   . Diabetes mellitus without complication ([place])   . Diaphragmatic hernia without mention of obstruction or gangrene   . Dyslipidemia   . Fibromyalgia   . GERD (gastroesophageal reflux disease)    with h/o esophagitis  . Guillain-Barre syndrome ([place])   . History of benign thymus tumor   . Insomnia, unspecified   . Lumbar spinal stenosis 03/30/2018   L4-5 level, severe  . Miscarriage 1962  . Mixed hyperlipidemia   . Morbid obesity ([place])   . Osteoporosis   . Pneumonia   . Polyneuropathy in diabetes(357.2)   . Rheumatoid arthritis(714.0)   . Spondylosis, lumbosacral   . Spontaneous ecchymoses   . Type II or unspecified type diabetes mellitus with peripheral circulatory disorders, uncontrolled(250.72)   . Unspecified chronic bronchitis ([place])   . Unspecified essential hypertension   . Unspecified hypothyroidism   . Unspecified pruritic disorder   . Unspecified urinary incontinence    There were no vitals taken for this visit.  Opioid Risk Score:   Fall Risk Score:  `1  Depression screen [facility] 2/9     06/16/2022    9:46 AM 04/11/2022    8:22 AM  02/17/2022   11:22 AM 01/11/2022    9:09 AM 12/14/2021    9:15 AM 08/02/2021   10:15 AM 06/29/2021   10:03 AM  Depression screen PHQ 2/9  Decreased Interest 0 0 0 0 0 1 0  Down, Depressed, Hopeless 0 0 0 0 0 1 0  PHQ - 2 Score 0 0 0 0 0 2 0      Review of Systems  Musculoskeletal:  Positive for back pain and gait problem.  All other systems reviewed and are negative.     Objective:   Physical Exam Vitals and nursing note reviewed.  Constitutional:      Appearance: Normal appearance.  Neck:     Comments: Cervical Paraspinal Tenderness: C--5-C-6 Cardiovascular:     Rate and Rhythm: Normal rate and regular rhythm.     Pulses: Normal pulses.     Heart sounds: Normal heart sounds.  Pulmonary:     Effort: Pulmonary effort is normal.     Breath sounds: Normal breath sounds.  Musculoskeletal:     Cervical back: Normal range of motion and neck supple.     Comments: Normal Muscle Bulk and Muscle Testing Reveals:  Upper Extremities: Full ROM and Muscle Strength 5/5 Bilateral AC Joint Tenderness Lumbar Paraspinal Tenderness: L-3-L-5  Lower Extremities: Decrease ROM and Muscle Strength 5/5 Bilateral Lower Extremities Flexion Produces Pain into her Bilateral Patella's and Bilateral Feet Arrived in wheelchair     Skin:    General: Skin is warm and dry.  Neurological:     Mental Status: She is alert and oriented to person, place, and time.  Psychiatric:        Mood and Affect: Mood normal.        Behavior: Behavior normal.         Assessment & Plan:  1.Failed Back Syndrome: Continue HEP as Tolerated. Continue to Monitor. 04/11/2022 2.Lumbar Radiculitis: Continue Gabapentin. Continue to monitor. Continue HEP as Tolerated.04/11/2022 3.Bilateral  Greater Trochanter Bursitis: .Continue to Alternate Ice and Heat Therapy. Continue current medication regimen. Continue to monitor. 04/11/2022 4.Bilateral  Primary OA:/ Both Knees  Continue HEP as Tolerated. Continue to Monitor.  04/11/2022 5.Insomnia: Continue Amitriptyline . Continue to Monitor.04/11/2022 6.Chronic Pain Syndrome: Refilled: Oxycodone 10/325 mg one tablet every 6 hours as needed for pain #120. We will continue the opioid monitoring program, this consists of regular clinic visits, examinations, urine drug screen, pill counts as well as use of [place] Controlled Substance Reporting system. A 12 month History has been reviewed on the [place] Controlled Substance Reporting System on 04/11/2022 7. Cervicalgia/ Cervical Radiculitis: Continue Gabapentin. Continue HEP as tolerated. Continue to monitor. 8. Polyarthralgia: Continue HEP as tolerated. Continue to monitor.  9. Polyneuropathy: Continue Gabapentin. Continue to Monitor.    F/U in 1 month

## 2022-07-19 ENCOUNTER — Telehealth: Payer: Self-pay | Admitting: Registered Nurse

## 2022-07-19 ENCOUNTER — Telehealth: Payer: Self-pay | Admitting: *Deleted

## 2022-07-19 ENCOUNTER — Other Ambulatory Visit: Payer: Self-pay | Admitting: Internal Medicine

## 2022-07-19 MED ORDER — OXYCODONE-ACETAMINOPHEN 10-325 MG PO TABS: 1.0000 | ORAL_TABLET | Freq: Every day | ORAL | 0 refills | Status: DC | PRN

## 2022-07-19 NOTE — Telephone Encounter (Signed)
Return Katherine Delgado call, no answer.

## 2022-07-19 NOTE — Telephone Encounter (Signed)
Spoke with Dr Ranell Patrick this morning.  Katherine Delgado current dose of Oxycodone was every 6 hours, Dr. Ranell Patrick note was reviewed and her sig will be changed to 5 times a day as needed for pain . MME will changed to 75.00. Dr Ranell Patrick in agreement. Call placed to Katherine Delgado regarding the above, no answer. Left message to return the call.  PMP was Reviewed and Oxycodone e-scribed to pharmacy.

## 2022-07-19 NOTE — Telephone Encounter (Signed)
Mrs Mortellaro says she tried to pick up but there was a complication. Please call her back.

## 2022-07-20 ENCOUNTER — Encounter: Payer: Self-pay | Admitting: Registered Nurse

## 2022-07-20 LAB — TOXASSURE SELECT,+ANTIDEPR,UR

## 2022-07-29 ENCOUNTER — Other Ambulatory Visit: Payer: Self-pay | Admitting: Family

## 2022-07-29 DIAGNOSIS — E876 Hypokalemia: Secondary | ICD-10-CM

## 2022-08-01 NOTE — Telephone Encounter (Signed)
High warning came up when trying to refill medication. Medication pended and sent to Marlowe Sax, NP for approval

## 2022-08-05 ENCOUNTER — Telehealth: Payer: Self-pay | Admitting: *Deleted

## 2022-08-05 NOTE — Telephone Encounter (Signed)
Urine drug screen for this encounter is consistent for prescribed medication 

## 2022-08-29 NOTE — Progress Notes (Deleted)
Office Visit Note  Patient: Katherine Delgado             Date of Birth: [birth year]           MRN: [id number]             PCP: Sandrea Hughs, NP Referring: Sandrea Hughs, NP Visit Date: 09/12/2022 Occupation: @GUAROCC$ @  Subjective:    History of Present Illness: Katherine Delgado is a 82 y.o. female with history of seropositive rheumatoid arthritis, sjogren's syndrome, osteoporosis, and DDD.  She is taking xeljanz 5 mg 1 tablet by mouth daily.   TB gold negative on 09/30/21.  Order for TB gold released today.  Orders for CBC and CMP released today.  Discussed the importance of holding xeljanz if she develops signs or symptoms of an infection and to resume once the infection has completely cleared.   Activities of Daily Living:  Patient reports morning stiffness for *** {minute/hour:19697}.   Patient {ACTIONS;DENIES/REPORTS:21021675::"Denies"} nocturnal pain.  Difficulty dressing/grooming: {ACTIONS;DENIES/REPORTS:21021675::"Denies"} Difficulty climbing stairs: {ACTIONS;DENIES/REPORTS:21021675::"Denies"} Difficulty getting out of chair: {ACTIONS;DENIES/REPORTS:21021675::"Denies"} Difficulty using hands for taps, buttons, cutlery, and/or writing: {ACTIONS;DENIES/REPORTS:21021675::"Denies"}  No Rheumatology ROS completed.   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   Primary osteoarthritis of right knee 07/25/2019   Wound dehiscence 08/17/2018   Lumbar spinal stenosis 03/30/2018   Rheumatoid arthritis involving both wrists with positive rheumatoid factor ([place]) 06/14/2016   High risk medication use 06/14/2016   Sjogren's syndrome ([place]) 06/14/2016   Primary osteoarthritis of both knees 06/14/2016   DDD (degenerative disc disease), lumbar 06/14/2016    Hypothyroidism 06/14/2016   Osteoporosis 06/14/2016   Abnormality of gait 04/19/2016   Type 2 diabetes mellitus with diabetic polyneuropathy, with long-term current use of insulin ([place]) 11/27/2015   Diabetes mellitus without complication ([place]) 123456   Headache(784.0) 07/31/2013   Sinus infection 07/31/2013   Chronic right-sided low back pain with right-sided sciatica 03/14/2013   Insomnia 12/06/2012   Nausea alone 12/06/2012   Diarrhea 12/06/2012   Urinary incontinence, urge 12/06/2012   Lumbosacral root lesions, not elsewhere classified 11/27/2012   Diabetic polyneuropathy associated with type 2 diabetes mellitus ([place]) 11/27/2012   EDEMA 05/04/2010   YEAST INFECTION 05/03/2010   Hyperlipidemia 05/03/2010   DEPRESSION 05/03/2010   History of peripheral neuropathy 05/03/2010   Essential hypertension 05/03/2010   ALLERGIC RHINITIS 05/03/2010   PNEUMONIA 05/03/2010   COPD (chronic obstructive pulmonary disease) ([place]) 05/03/2010   GERD 05/03/2010   Fibromyalgia 05/03/2010   DYSPNEA 05/03/2010   CHEST PAIN 05/03/2010    Past Medical History:  Diagnosis Date   Abnormality of gait 04/19/2016   Acute infective polyneuritis ([place]) 2002   Allergic rhinitis due to pollen    Chronic pain syndrome    COPD (chronic obstructive pulmonary disease) (HCC)    chronic bronchitis   Depressive disorder, not elsewhere classified    Diabetes mellitus without complication ([place])    Diaphragmatic hernia without mention of obstruction or gangrene    Dyslipidemia    Fibromyalgia    GERD (gastroesophageal reflux disease)    with h/o esophagitis   Guillain-Barre syndrome ([place])    History of benign thymus tumor    Insomnia, unspecified    Lumbar spinal stenosis 03/30/2018   L4-5 level, severe  Miscarriage 1962   Mixed hyperlipidemia    Morbid obesity (HCC)    Osteoporosis    Pneumonia    Polyneuropathy in diabetes(357.2)    Rheumatoid arthritis(714.0)    Spondylosis, lumbosacral     Spontaneous ecchymoses    Type II or unspecified type diabetes mellitus with peripheral circulatory disorders, uncontrolled(250.72)    Unspecified chronic bronchitis (HCC)    Unspecified essential hypertension    Unspecified hypothyroidism    Unspecified pruritic disorder    Unspecified urinary incontinence     Family History  Problem Relation Age of Onset   Alzheimer's disease Mother    Heart disease Mother    Heart disease Father    Liver disease Father    Cancer Brother    Arthritis Son    Colon polyps Brother    Colon cancer Neg Hx    Esophageal cancer Neg Hx    Kidney disease Neg Hx    Stomach cancer Neg Hx    Rectal cancer Neg Hx    Past Surgical History:  Procedure Laterality Date   ABDOMINAL HYSTERECTOMY  1974   abdominal tumor  2002   APPENDECTOMY     APPLICATION OF A-CELL OF BACK N/A 09/26/2018   Procedure: With Acell;  Surgeon: Wallace Going, DO;  Location: [place];  Service: Plastics;  Laterality: N/A;   APPLICATION OF WOUND VAC N/A 09/26/2018   Procedure: Vac placement;  Surgeon: Wallace Going, DO;  Location: [place];  Service: Plastics;  Laterality: N/A;   BACK SURGERY  1982   BRONCHOSCOPY  2001   CATARACT EXTRACTION, BILATERAL  09/2019, 10/2019   CHOLECYSTECTOMY  1984   INCISION AND DRAINAGE OF WOUND N/A 09/26/2018   Procedure: Debridement of spine wound;  Surgeon: Wallace Going, DO;  Location: [place];  Service: Plastics;  Laterality: N/A;   KNEE SURGERY Bilateral 08/27/2010 (L) and 01/11/2011 (R)   LUMBAR LAMINECTOMY/DECOMPRESSION MICRODISCECTOMY N/A 07/03/2018   Procedure: Lumbar Three to Lumbar Five Laminectomy;  Surgeon: Judith Part, MD;  Location: [place];  Service: Neurosurgery;  Laterality: N/A;   LUMBAR WOUND DEBRIDEMENT N/A 08/20/2018   Procedure: POSTERIOR LUMBAR SPINAL WOUND DEBRIDEMENT AND REVISION;  Surgeon: Judith Part, MD;  Location: [place];  Service: Neurosurgery;  Laterality: N/A;  POSTERIOR LUMBAR SPINAL WOUND REVISION    [place]  1962   OVARIAN CYST SURGERY  1968   thymus tumor     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   TONSILLECTOMY     Social History   Social History Narrative   Walks with cane   Right handed    Caffeine use: Coffee (2 cups every morning)   Tea: sometimes   Soda: none   Immunization History  Administered Date(s) Administered   Influenza-Unspecified 06/10/2011   PFIZER(Purple Top)SARS-COV-2 Vaccination 11/13/2019, 11/30/2019   Pneumococcal Conjugate-13 07/07/2016   Pneumococcal Polysaccharide-23 11/21/2003, 10/05/2017   Tdap 05/28/2016     Objective: Vital Signs: There were no vitals taken for this visit.   Physical Exam Vitals and nursing note reviewed.  Constitutional:      Appearance: She is well-developed.  HENT:     Head: Normocephalic and atraumatic.  Eyes:     Conjunctiva/sclera: Conjunctivae normal.  Cardiovascular:     Rate and Rhythm: Normal rate and regular rhythm.     Heart sounds: Normal heart sounds.  Pulmonary:     Effort: Pulmonary effort is normal.     Breath sounds: Normal breath sounds.  Abdominal:     General:  Bowel sounds are normal.     Palpations: Abdomen is soft.  Musculoskeletal:     Cervical back: Normal range of motion.  Skin:    General: Skin is warm and dry.     Capillary Refill: Capillary refill takes less than 2 seconds.  Neurological:     Mental Status: She is alert and oriented to person, place, and time.  Psychiatric:        Behavior: Behavior normal.      Musculoskeletal Exam: ***  CDAI Exam: CDAI Score: -- Patient Global: --; Provider Global: -- Swollen: --; Tender: -- Joint Exam 09/12/2022   No joint exam has been documented for this visit   There is currently no information documented on the homunculus. Go to the Rheumatology activity and complete the homunculus joint exam.  Investigation: No additional findings.  Imaging: No results found.  Recent Labs: Lab Results  Component Value Date   WBC 13.2 (H) 03/03/2022    HGB 14.2 03/03/2022   PLT 338 03/03/2022   NA 143 03/03/2022   K 3.7 03/03/2022   CL 102 03/03/2022   CO2 29 03/03/2022   GLUCOSE 41 (L) 03/03/2022   BUN 16 03/03/2022   CREATININE 1.03 (H) 03/03/2022   BILITOT 0.4 03/03/2022   ALKPHOS 96 01/25/2021   AST 17 03/03/2022   ALT 10 03/03/2022   PROT 6.9 03/03/2022   ALBUMIN 3.5 01/25/2021   CALCIUM 9.6 03/03/2022   GFRAA 65 02/12/2021   QFTBGOLDPLUS NEGATIVE 09/30/2021    Speciality Comments: No specialty comments available.  Procedures:  No procedures performed Allergies: Influenza vaccines, Penicillins, Statins, and Sulfamethoxazole-trimethoprim   Assessment / Plan:     Visit Diagnoses: No diagnosis found.  Orders: No orders of the defined types were placed in this encounter.  No orders of the defined types were placed in this encounter.   Face-to-face time spent with patient was *** minutes. Greater than 50% of time was spent in counseling and coordination of care.  Follow-Up Instructions: No follow-ups on file.   Earnestine Mealing, CMA  Note - This record has been created using Editor, commissioning.  Chart creation errors have been sought, but may not always  have been located. Such creation errors do not reflect on  the standard of medical care.

## 2022-08-31 ENCOUNTER — Ambulatory Visit (INDEPENDENT_AMBULATORY_CARE_PROVIDER_SITE_OTHER): Payer: Medicare Other | Admitting: Family

## 2022-08-31 ENCOUNTER — Encounter: Payer: Self-pay | Admitting: Family

## 2022-08-31 VITALS — BP 140/78 | HR 67 | Temp 97.1°F | Resp 18 | Ht 66.0 in | Wt 231.6 lb

## 2022-08-31 DIAGNOSIS — R300 Dysuria: Secondary | ICD-10-CM

## 2022-08-31 DIAGNOSIS — B372 Candidiasis of skin and nail: Secondary | ICD-10-CM | POA: Diagnosis not present

## 2022-08-31 DIAGNOSIS — R350 Frequency of micturition: Secondary | ICD-10-CM

## 2022-08-31 LAB — POCT URINALYSIS DIPSTICK
Bilirubin, UA: NEGATIVE
Glucose, UA: NEGATIVE
Ketones, UA: POSITIVE
Nitrite, UA: NEGATIVE
Protein, UA: NEGATIVE
Spec Grav, UA: 1.015 (ref 1.010–1.025)
Urobilinogen, UA: NEGATIVE E.U./dL — AB
pH, UA: 5 (ref 5.0–8.0)

## 2022-08-31 MED ORDER — CRANBERRY 475 MG PO CAPS: 475.0000 mg | ORAL_CAPSULE | Freq: Two times a day (BID) | ORAL | 3 refills | Status: AC

## 2022-08-31 NOTE — Progress Notes (Signed)
Provider: Marlowe Sax FNP-C  Mahalia Dykes, Nelda Bucks, NP  Patient Care Team: Suzzane Quilter, Nelda Bucks, NP as PCP - General (Family Medicine) Bo Merino, MD (Rheumatology) Altheimer, Legrand Como, MD as Attending Physician (Endocrinology) Newt Minion, MD as Consulting Physician (Orthopedic Surgery)  Extended Emergency Contact Information Primary Emergency Contact: [facility] Address: [address]          [place], [place] [address] Johnnette Litter of [place] Phone: [phone] Relation: Son Secondary Emergency Contact: Marlana Latus, [place] of [place] Mobile Phone: [phone] Relation: Son  Code Status: Full Code  Goals of care: Advanced Directive information    08/31/2022   10:48 AM  Advanced Directives  Does Patient Have a Medical Advance Directive? Yes  Type of Advance Directive Living will;Healthcare Power of Attorney  Does patient want to make changes to medical advance directive? No - Patient declined  Copy of [place] in Chart? No - copy requested     Chief Complaint  Patient presents with   Acute Visit    Patient complains of possible UTI. Symptoms are dysuria, and urinary frequency.     HPI:  Pt is a 82 y.o. female seen today for an acute visit for evaluation of urine frequency and dysuria.states never got better from previous urinary tract infection.states has had chills all the time but thinks more from thyroid issues than UTI.  She denies any fever,chills,nausea,vomiting,abdominal pain,flank pain,urgency,difficult urination or hematuria.  She was referred to urologist on previous visit states recently had a call with specialist.has appointment with [facility] 09/11/2022.   States bilateral groin and breast fold yeast infection has improved but not completely resolved.continues to use Nystatin cream.previously advised to purchase interdry moisture -wicking fabric but states has not.Has been  using cheese fabric instead. Sometimes uses baby powder after her showers. Advised to avoid baby powder and use Nystatin cream to prevent cracking of powder then worsening moisture on folds. States will need diflucan if she has to taken another antibiotics for UTI   Past Medical History:  Diagnosis Date   Abnormality of gait 04/19/2016   Acute infective polyneuritis ([place]) 2002   Allergic rhinitis due to pollen    Chronic pain syndrome    COPD (chronic obstructive pulmonary disease) (HCC)    chronic bronchitis   Depressive disorder, not elsewhere classified    Diabetes mellitus without complication ([place])    Diaphragmatic hernia without mention of obstruction or gangrene    Dyslipidemia    Fibromyalgia    GERD (gastroesophageal reflux disease)    with h/o esophagitis   Guillain-Barre syndrome (HCC)    History of benign thymus tumor    Insomnia, unspecified    Lumbar spinal stenosis 03/30/2018   L4-5 level, severe   Miscarriage 1962   Mixed hyperlipidemia    Morbid obesity ([place])    Osteoporosis    Pneumonia    Polyneuropathy in diabetes(357.2)    Rheumatoid arthritis(714.0)    Spondylosis, lumbosacral    Spontaneous ecchymoses    Type II or unspecified type diabetes mellitus with peripheral circulatory disorders, uncontrolled(250.72)    Unspecified chronic bronchitis (HCC)    Unspecified essential hypertension    Unspecified hypothyroidism    Unspecified pruritic disorder    Unspecified urinary incontinence    Past Surgical History:  Procedure Laterality Date   ABDOMINAL HYSTERECTOMY  1974   abdominal tumor  2002   APPENDECTOMY     APPLICATION OF A-CELL OF  BACK N/A 09/26/2018   Procedure: With Acell;  Surgeon: Wallace Going, DO;  Location: [facility];  Service: Plastics;  Laterality: N/A;   APPLICATION OF WOUND VAC N/A 09/26/2018   Procedure: Vac placement;  Surgeon: Wallace Going, DO;  Location: [facility];  Service: Plastics;  Laterality: N/A;   BACK SURGERY  1982    BRONCHOSCOPY  2001   CATARACT EXTRACTION, BILATERAL  09/2019, 10/2019   CHOLECYSTECTOMY  1984   INCISION AND DRAINAGE OF WOUND N/A 09/26/2018   Procedure: Debridement of spine wound;  Surgeon: Wallace Going, DO;  Location: [facility];  Service: Plastics;  Laterality: N/A;   KNEE SURGERY Bilateral 08/27/2010 (L) and 01/11/2011 (R)   LUMBAR LAMINECTOMY/DECOMPRESSION MICRODISCECTOMY N/A 07/03/2018   Procedure: Lumbar Three to Lumbar Five Laminectomy;  Surgeon: Judith Part, MD;  Location: [facility];  Service: Neurosurgery;  Laterality: N/A;   LUMBAR WOUND DEBRIDEMENT N/A 08/20/2018   Procedure: POSTERIOR LUMBAR SPINAL WOUND DEBRIDEMENT AND REVISION;  Surgeon: Judith Part, MD;  Location: [place] [facility];  Service: Neurosurgery;  Laterality: N/A;  POSTERIOR LUMBAR SPINAL WOUND REVISION   miscarrage  1962   OVARIAN CYST SURGERY  1968   thymus tumor     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   TONSILLECTOMY      Allergies  Allergen Reactions   Influenza Vaccines Other (See Comments)    "h/o Guillain Barre; dr's told me years ago never to take another flu shot as it could relapse Rosalee Kaufman; has to do with when vaccine being changed to H1N1 virus"   Penicillins Hives, Itching, Swelling and Rash    Has patient had a PCN reaction causing immediate rash, facial/tongue/throat swelling, SOB or lightheadedness with hypotension:Unknown Has patient had a PCN reaction causing severe rash involving mucus membranes or skin necrosis: Unknown Has patient had a PCN reaction that required hospitalization:Unknown Has patient had a PCN reaction occurring within the last 10 years: Unknown If all of the above answers are "NO", then may proceed with Cephalosporin use.    Statins Other (See Comments)    Myopathy, transaminitis   Sulfamethoxazole-Trimethoprim Itching    Outpatient Encounter Medications as of 08/31/2022  Medication Sig   amitriptyline (ELAVIL) 50 MG tablet Take 1 tablet (50 mg total) by mouth at bedtime.    amLODipine (NORVASC) 10 MG tablet TAKE 1 TABLET(10 MG) BY MOUTH DAILY   aspirin EC 81 MG tablet Take 81 mg by mouth 2 (two) times a week.   augmented betamethasone dipropionate (DIPROLENE-AF) 0.05 % cream Apply topically 2 (two) times daily as needed.   calamine lotion Apply 1 application topically 3 (three) times daily. To affected areas on right fore arm   cholecalciferol (VITAMIN D3) 25 MCG (1000 UT) tablet Take 1,000 Units by mouth daily.   Cranberry 475 MG CAPS Take 1 capsule (475 mg total) by mouth 2 (two) times daily.   docusate sodium (COLACE) 100 MG capsule Take 100 mg by mouth daily as needed for mild constipation.   DULoxetine (CYMBALTA) 20 MG capsule TAKE 1 CAPSULE(20 MG) BY MOUTH DAILY   ezetimibe (ZETIA) 10 MG tablet Take 1 tablet (10 mg total) by mouth daily.   fluticasone (CUTIVATE) 0.05 % cream Apply topically 2 (two) times daily as needed.   folic acid (FOLVITE) 1 MG tablet Take 2 tablets (2 mg total) by mouth daily.   furosemide (LASIX) 20 MG tablet TAKE 1 BY MOUTH TWICE DAILY FOR 3 DAYS AS NEEDED FOR 3 POUND WEIGHT GAIN IN ONE  DAY OR 5 POUNDS IN ONE WEEK   gabapentin (NEURONTIN) 600 MG tablet TAKE 1 TABLET(600 MG) BY MOUTH THREE TIMES DAILY   insulin glargine, 1 Unit Dial, (TOUJEO SOLOSTAR) 300 UNIT/ML Solostar Pen 45 units QAM and 40 units  QPM   insulin lispro (HUMALOG KWIKPEN) 200 UNIT/ML KwikPen INJECT UP TO 80 UNITS DAILY AS DIRECTED   Insulin Pen Needle (B-D UF III MINI PEN NEEDLES) 31G X 5 MM MISC USE AS DIRECTED   ipratropium-albuterol (DUONEB) 0.5-2.5 (3) MG/3ML SOLN Take 3 mLs by nebulization every 6 (six) hours as needed.   ketoconazole (NIZORAL) 2 % cream Apply 1 application topically 2 (two) times daily.   levothyroxine (SYNTHROID) 100 MCG tablet Take 1 tablet (100 mcg total) by mouth daily.   lidocaine (LIDODERM) 5 % PLACE 1 PATCH ONTO THE SKIN DAILY AS DIRECTED, REMOVE AND DISCARD PATCH WITHN 12 HOURS OR AS DIRECTED BY MD   lisinopril (ZESTRIL) 20 MG tablet TAKE  1 TABLET BY MOUTH EVERY DAY   loperamide (IMODIUM A-D) 2 MG tablet Take 2 mg by mouth 4 (four) times daily as needed for diarrhea or loose stools.   Melatonin 10 MG TABS Take 10 mg by mouth at bedtime as needed (sleep).   Menthol, Topical Analgesic, (ICY HOT MEDICATED SPRAY EX) Apply 1 application topically as needed (shoulder pain).   metoprolol tartrate (LOPRESSOR) 50 MG tablet TAKE 1 TABLET BY MOUTH TWICE DAILY   Multiple Vitamins-Minerals (MULTIVITAMIN ADULT PO) Take 1 tablet by mouth daily.   MYRBETRIQ 50 MG TB24 tablet TAKE 1 TABLET(50 MG) BY MOUTH DAILY   nystatin (MYCOSTATIN/NYSTOP) powder Apply 1 application topically 3 (three) times daily.   nystatin (NYSTATIN) powder APPLY TOPICALLY THREE TIMES DAILY AS DIRECTED FOR 14 DAYS and prn yeast infection   nystatin cream (MYCOSTATIN) Apply 1 application topically 3 (three) times daily. To skin folds   omega-3 acid ethyl esters (LOVAZA) 1 g capsule TAKE ONE CAPSULE BY MOUTH EVERY DAY   oxyCODONE-acetaminophen (PERCOCET) 10-325 MG tablet Take 1 tablet by mouth 5 (five) times daily as needed for pain.   Phenyleph-Doxylamine-DM-APAP (ALKA-SELTZER PLS ALLERGY & CGH PO) Take by mouth.   polyvinyl alcohol (LIQUIFILM TEARS) 1.4 % ophthalmic solution Place 1 drop into both eyes 4 (four) times daily as needed (for dry/irritated eyes).    potassium chloride (MICRO-K) 10 MEQ CR capsule TAKE 2 CAPSULES(20 MEQ) BY MOUTH TWICE DAILY WHEN AS NEEDED WHEN TAKING A DOSE OF LASIX   Semaglutide, 1 MG/DOSE, 4 MG/3ML SOPN Inject 1 mg as directed once a week.   sodium chloride (OCEAN) 0.65 % nasal spray Place 2 sprays into the nose as needed for congestion.   tiotropium (SPIRIVA) 18 MCG inhalation capsule Place 18 mcg into inhaler and inhale as needed.   tiZANidine (ZANAFLEX) 2 MG tablet TAKE 1 TABLET(2 MG) BY MOUTH THREE TIMES DAILY   triamterene-hydrochlorothiazide (MAXZIDE-25) 37.5-25 MG tablet TAKE 1 TABLET BY MOUTH DAILY   venlafaxine XR (EFFEXOR-XR) 75 MG 24  hr capsule Take one capsule by mouth once daily with breakfast.   Vitamin D, Ergocalciferol, (DRISDOL) 1.25 MG (50000 UNIT) CAPS capsule Take 1 capsule (50,000 Units total) by mouth every 7 (seven) days.   XELJANZ 5 MG TABS TAKE 1 TABLET BY MOUTH DAILY   [DISCONTINUED] azithromycin (ZITHROMAX) 250 MG tablet Take 2 tablets (500 mg ) by mouth x 1 dose then one tablet ( 250 mg ) by mouth daily x 4 days   [DISCONTINUED] fluconazole (DIFLUCAN) 150 MG tablet Take one  by mouth once a week x 4 weeks.   No facility-administered encounter medications on file as of 08/31/2022.    Review of Systems  Constitutional:  Negative for appetite change, chills, fatigue, fever and unexpected weight change.  Respiratory:  Negative for cough, chest tightness, shortness of breath and wheezing.   Cardiovascular:  Negative for chest pain, palpitations and leg swelling.  Gastrointestinal:  Negative for abdominal distention, abdominal pain, nausea and vomiting.  Genitourinary:  Positive for dysuria and frequency. Negative for difficulty urinating, flank pain and urgency.  Musculoskeletal:  Positive for arthralgias and gait problem. Negative for back pain.  Skin:  Negative for color change, pallor, rash and wound.       Bilateral breast and abdominal fold skin redness has improved but not resolved   Psychiatric/Behavioral:  Negative for agitation, behavioral problems and sleep disturbance. The patient is not nervous/anxious.     Immunization History  Administered Date(s) Administered   Influenza-Unspecified 06/10/2011   PFIZER(Purple Top)SARS-COV-2 Vaccination 11/13/2019, 11/30/2019   Pneumococcal Conjugate-13 07/07/2016   Pneumococcal Polysaccharide-23 11/21/2003, 10/05/2017   Tdap 05/28/2016   Pertinent  Health Maintenance Due  Topic Date Due   FOOT EXAM  10/15/2021   OPHTHALMOLOGY EXAM  03/25/2022   HEMOGLOBIN A1C  09/02/2022   DEXA SCAN  09/20/2025   INFLUENZA VACCINE  Discontinued      03/21/2022     9:36 AM 04/11/2022    8:22 AM 06/16/2022    9:46 AM 06/28/2022   10:44 AM 08/31/2022   10:48 AM  Fall Risk  Falls in the past year? 1 1 0 0 0  Was there an injury with Fall? 0 1  0 0  Fall Risk Category Calculator 1 2  0 0  Fall Risk Category Low Moderate  Low Low  Patient Fall Risk Level  Moderate fall risk  Low fall risk Low fall risk  Patient at Risk for Falls Due to History of fall(s);Impaired balance/gait Impaired balance/gait  No Fall Risks No Fall Risks  Fall risk Follow up Falls evaluation completed   Falls evaluation completed Falls evaluation completed   Functional Status Survey:    Vitals:   08/31/22 1041  BP: (!) 140/78  Pulse: 67  Resp: 18  Temp: (!) 97.1 F (36.2 C)  SpO2: 95%  Weight: 231 lb 9.6 oz (105.1 kg)  Height: '5\' 6"'$  (1.676 m)   Body mass index is 37.38 kg/m. Physical Exam Vitals reviewed.  Constitutional:      General: She is not in acute distress.    Appearance: Normal appearance. She is obese. She is not ill-appearing or diaphoretic.  HENT:     Head: Normocephalic.     Mouth/Throat:     Mouth: Mucous membranes are moist.     Pharynx: Oropharynx is clear. No oropharyngeal exudate or posterior oropharyngeal erythema.  Eyes:     General: No scleral icterus.       Right eye: No discharge.        Left eye: No discharge.     Conjunctiva/sclera: Conjunctivae normal.     Pupils: Pupils are equal, round, and reactive to light.  Cardiovascular:     Rate and Rhythm: Normal rate and regular rhythm.     Pulses: Normal pulses.     Heart sounds: Normal heart sounds. No murmur heard.    No friction rub. No gallop.  Pulmonary:     Effort: Pulmonary effort is normal. No respiratory distress.     Breath sounds: Normal breath sounds.  No wheezing, rhonchi or rales.  Chest:     Chest wall: No tenderness.  Abdominal:     General: Bowel sounds are normal. There is no distension.     Palpations: Abdomen is soft. There is no mass.     Tenderness: There is no  abdominal tenderness. There is no right CVA tenderness, left CVA tenderness, guarding or rebound.  Musculoskeletal:        General: No swelling or tenderness. Normal range of motion.     Right lower leg: No edema.     Left lower leg: No edema.  Skin:    General: Skin is warm and dry.     Coloration: Skin is not pale.     Findings: No rash.     Comments: Bilateral abdominal fold beefy redness noted.   Neurological:     Mental Status: She is alert and oriented to person, place, and time.     Motor: No weakness.     Gait: Gait abnormal.  Psychiatric:        Mood and Affect: Mood normal.        Speech: Speech normal.        Behavior: Behavior normal.    Labs reviewed: Recent Labs    09/02/21 1039 09/30/21 1532 03/03/22 0850  NA 140 138 143  K 4.1 4.0 3.7  CL 103 101 102  CO2 '28 28 29  '$ GLUCOSE 57* 105* 41*  BUN 18 27* 16  CREATININE 1.02 1.06* 1.03*  CALCIUM 9.1 9.4 9.6   Recent Labs    09/30/21 1532 03/03/22 0850  AST 12 17  ALT 6 10  BILITOT 0.3 0.4  PROT 7.0 6.9   Recent Labs    09/30/21 1532 03/03/22 0850  WBC 14.7* 13.2*  NEUTROABS 10,790* 7,828*  HGB 13.6 14.2  HCT 42.4 43.7  MCV 82.3 84.7  PLT 363 338   Lab Results  Component Value Date   TSH 0.24 (L) 03/02/2022   Lab Results  Component Value Date   HGBA1C 6.4 (A) 03/02/2022   Lab Results  Component Value Date   CHOL 127 02/12/2021   HDL 17 (L) 02/12/2021   LDLCALC 76 02/12/2021   TRIG 259 (H) 02/12/2021   CHOLHDL 7.5 (H) 02/12/2021    Significant Diagnostic Results in last 30 days:  No results found.  Assessment/Plan 1. Urinary frequency Afebrile  Negative exam finding - POC Urinalysis Dipstick indicates yellow cloudy urine positive for ketones, trace blood and large 3+ leukocytes.Discussed results with patient okay with sending urine for culture. Will notify provider if symptoms worsen or running high fevers.  Advised to take cranberry capsule as below. - Urine Culture - Cranberry  475 MG CAPS; Take 1 capsule (475 mg total) by mouth 2 (two) times daily.  Dispense: 60 capsule; Refill: 3  2. Dysuria Afebrile -Advised to increase fluid intake indicates yellow cloudy urine positive for ketones, trace blood and large 3+ leukocytes. - POC Urinalysis Dipstick - Urine Culture - Cranberry 475 MG CAPS; Take 1 capsule (475 mg total) by mouth 2 (two) times daily.  Dispense: 60 capsule; Refill: 3  3. Candidal skin infection Bilateral breast and abdominal folds beefy redness has improved but not resolved Continue on nystatin cream 1 application twice daily Advised to avoid using baby powder to prevent worsening symptoms  Family/ staff Communication: Reviewed plan of care with patient verbalized understanding  Labs/tests ordered:  - POC Urinalysis Dipstick  - Urine Culture  Next Appointment: Return if symptoms  worsen or fail to improve.    Sandrea Hughs, NP

## 2022-08-31 NOTE — Patient Instructions (Signed)
Notify provider if symptoms worsen or running any fever or chills

## 2022-09-01 ENCOUNTER — Encounter
Payer: Medicare Other | Attending: Physical Medicine and Rehabilitation | Admitting: Physical Medicine and Rehabilitation

## 2022-09-01 ENCOUNTER — Encounter: Payer: Self-pay | Admitting: Physical Medicine and Rehabilitation

## 2022-09-01 VITALS — BP 153/75 | HR 92 | Ht 66.0 in | Wt 227.0 lb

## 2022-09-01 DIAGNOSIS — L4050 Arthropathic psoriasis, unspecified: Secondary | ICD-10-CM | POA: Diagnosis not present

## 2022-09-01 DIAGNOSIS — M961 Postlaminectomy syndrome, not elsewhere classified: Secondary | ICD-10-CM

## 2022-09-01 DIAGNOSIS — K5903 Drug induced constipation: Secondary | ICD-10-CM | POA: Insufficient documentation

## 2022-09-01 DIAGNOSIS — T402X5A Adverse effect of other opioids, initial encounter: Secondary | ICD-10-CM | POA: Diagnosis not present

## 2022-09-01 DIAGNOSIS — Z8701 Personal history of pneumonia (recurrent): Secondary | ICD-10-CM | POA: Diagnosis not present

## 2022-09-01 LAB — URINE CULTURE
MICRO NUMBER:: 14413376
Result:: NO GROWTH
SPECIMEN QUALITY:: ADEQUATE

## 2022-09-01 MED ORDER — OXYCODONE-ACETAMINOPHEN 10-325 MG PO TABS: 1.0000 | ORAL_TABLET | ORAL | 0 refills | Status: DC | PRN

## 2022-09-01 NOTE — Progress Notes (Signed)
Subjective:    Patient ID: Katherine Delgado, female    DOB: 03-21-1941, 82 y.o.   MRN: [id number]  HPI:   Katherine Delgado is a 82 y.o. female who returns for f/u for chronic pain and medication refill. She states her pain is located in her bilateral shoulders, lower back pain radiating into his left hip. Her current exercise regime is receiving PT/OT two days a week.  1) RUE psoriatic arthritis -pain has been severe -weather worsens her pain  -the percocet does help her and she thinks the increase to 6 tabs per day will help her -misdiagnosed as shingles initially.  -The pain has gotten better but it still can be sever and feels like someone beat her with a hammer.  -Pain radiates up into the elbow and shoulder.  -The pain can be excruciating.  -She asks if we can add something else.  -no benefit from Qutenza.  -pain is currently worst in her elbow -radiates all the way into her hand -this has been hurting her.   2) Pneumonia She was hospitalized with pneumonia since I saw her last. She was also told she had shingles, and she is toward the end of it now. Her fingers still have decreased strength on her right side- she does note improvements. She cannot get her right thumb to elevated. She will be seeing Dr. Estanislado Pandy. She has been unable to write.  -sometimes feels short of breath -uses nebulizer at least once per day  3) Incontinence She has been using cranberry for her incontinence.   4) Failed back syndrome -due for refill of Percocet early next month -she hates to think that she will be in pain for the rest of her years -it continues to provide her relief -lidocaine patches help -provided with pain journal last visit -her son accompanies her today -she did obtain relief from the lidocaine patch before -combination of tylenol and Percocet is working for her -pain medicine helps somewhat.  -she would like to increase the pain medicine somewhat.  -she is 81 years and hopes she  does not have to live the rest of her life in pain  5) Insomnia -not sleeping at night -this continues to be a problem -she is taking the sleeping medication I prescribed -she feels if she can get some sleep that is great -we increased the Amitriptyline to '50mg'$  at once point -she tries to take Amitriptyline early so that she is not as groggy in the morning.  -she know that sleep is a major factor.   6) Foot fracture -she has been following up with Dr Sharol Given -her pain was worse initially but has now improved and she does not feel she needs any additional pain medication.  -she has a follow-up appointment with Dr. Sharol Given and is hoping she will be discharged soon. She will ask him whether she would benefit from PT  7) Chronic pain syndrome -she just picked up her Percocet refill on Friday.  -she feels like her diffuse aches and pains have been worsening -she has been trying to eat mostly fruits and vegetables -she would be interested in vitamin D supplementation    Pain Inventory Average Pain 8 Pain Right Now 9 My pain is sharp, burning, stabbing, and aching  In the last 24 hours, has pain interfered with the following? General activity 10 Relation with others 10 Enjoyment of life 10 What TIME of day is your pain at its worst? morning , daytime, and night Sleep (in  general) Poor  Pain is worse with: walking, bending, sitting, standing, and some activites Pain improves with: rest, therapy/exercise, and medication Relief from Meds: 6  Family History  Problem Relation Age of Onset   Alzheimer's disease Mother    Heart disease Mother    Heart disease Father    Liver disease Father    Cancer Brother    Arthritis Son    Colon polyps Brother    Colon cancer Neg Hx    Esophageal cancer Neg Hx    Kidney disease Neg Hx    Stomach cancer Neg Hx    Rectal cancer Neg Hx    Social History   Socioeconomic History   Marital status: Widowed    Spouse name: Not on file   Number of  children: 2   Years of education: 14   Highest education level: Not on file  Occupational History   Occupation: Retired  Tobacco Use   Smoking status: Former    Packs/day: 0.10    Years: 10.00    Total pack years: 1.00    Types: Cigarettes    Quit date: 12/07/1979    Years since quitting: 42.7    Passive exposure: Current   Smokeless tobacco: Never  Vaping Use   Vaping Use: Never used  Substance and Sexual Activity   Alcohol use: No    Alcohol/week: 0.0 standard drinks of alcohol   Drug use: No   Sexual activity: Not Currently  Other Topics Concern   Not on file  Social History Narrative   Walks with cane   Right handed    Caffeine use: Coffee (2 cups every morning)   Tea: sometimes   Soda: none   Social Determinants of Health   Financial Resource Strain: Medium Risk (10/05/2017)   Overall Financial Resource Strain (CARDIA)    Difficulty of Paying Living Expenses: Somewhat hard  Food Insecurity: Food Insecurity Present (10/05/2017)   Hunger Vital Sign    Worried About Running Out of Food in the Last Year: Sometimes true    Ran Out of Food in the Last Year: Sometimes true  Transportation Needs: No Transportation Needs (10/05/2017)   PRAPARE - Hydrologist (Medical): No    Lack of Transportation (Non-Medical): No  Physical Activity: Inactive (10/05/2017)   Exercise Vital Sign    Days of Exercise per Week: 0 days    Minutes of Exercise per Session: 0 min  Stress: Stress Concern Present (10/05/2017)   [place]    Feeling of Stress : Rather much  Social Connections: Moderately Isolated (10/05/2017)   Social Connection and Isolation Panel [NHANES]    Frequency of Communication with Friends and Family: More than three times a week    Frequency of Social Gatherings with Friends and Family: More than three times a week    Attends Religious Services: Never    Marine scientist  or Organizations: No    Attends Archivist Meetings: Never    Marital Status: Widowed   Past Surgical History:  Procedure Laterality Date   ABDOMINAL HYSTERECTOMY  1974   abdominal tumor  2002   APPENDECTOMY     APPLICATION OF A-CELL OF BACK N/A 09/26/2018   Procedure: With Acell;  Surgeon: Wallace Going, DO;  Location: [place];  Service: Plastics;  Laterality: N/A;   APPLICATION OF WOUND VAC N/A 09/26/2018   Procedure: Vac placement;  Surgeon: Wallace Going,  DO;  Location: [place];  Service: Plastics;  Laterality: N/A;   BACK SURGERY  1982   BRONCHOSCOPY  2001   CATARACT EXTRACTION, BILATERAL  09/2019, 10/2019   CHOLECYSTECTOMY  1984   INCISION AND DRAINAGE OF WOUND N/A 09/26/2018   Procedure: Debridement of spine wound;  Surgeon: Wallace Going, DO;  Location: [place];  Service: Plastics;  Laterality: N/A;   KNEE SURGERY Bilateral 08/27/2010 (L) and 01/11/2011 (R)   LUMBAR LAMINECTOMY/DECOMPRESSION MICRODISCECTOMY N/A 07/03/2018   Procedure: Lumbar Three to Lumbar Five Laminectomy;  Surgeon: Judith Part, MD;  Location: [place];  Service: Neurosurgery;  Laterality: N/A;   LUMBAR WOUND DEBRIDEMENT N/A 08/20/2018   Procedure: POSTERIOR LUMBAR SPINAL WOUND DEBRIDEMENT AND REVISION;  Surgeon: Judith Part, MD;  Location: [place];  Service: Neurosurgery;  Laterality: N/A;  POSTERIOR LUMBAR SPINAL WOUND REVISION   miscarrage  1962   OVARIAN CYST SURGERY  1968   thymus tumor     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   TONSILLECTOMY     Past Surgical History:  Procedure Laterality Date   ABDOMINAL HYSTERECTOMY  1974   abdominal tumor  2002   APPENDECTOMY     APPLICATION OF A-CELL OF BACK N/A 09/26/2018   Procedure: With Acell;  Surgeon: Wallace Going, DO;  Location: [place];  Service: Plastics;  Laterality: N/A;   APPLICATION OF WOUND VAC N/A 09/26/2018   Procedure: Vac placement;  Surgeon: Wallace Going, DO;  Location: [place];  Service: Plastics;  Laterality: N/A;    BACK SURGERY  1982   BRONCHOSCOPY  2001   CATARACT EXTRACTION, BILATERAL  09/2019, 10/2019   CHOLECYSTECTOMY  1984   INCISION AND DRAINAGE OF WOUND N/A 09/26/2018   Procedure: Debridement of spine wound;  Surgeon: Wallace Going, DO;  Location: [place];  Service: Plastics;  Laterality: N/A;   KNEE SURGERY Bilateral 08/27/2010 (L) and 01/11/2011 (R)   LUMBAR LAMINECTOMY/DECOMPRESSION MICRODISCECTOMY N/A 07/03/2018   Procedure: Lumbar Three to Lumbar Five Laminectomy;  Surgeon: Judith Part, MD;  Location: [place];  Service: Neurosurgery;  Laterality: N/A;   LUMBAR WOUND DEBRIDEMENT N/A 08/20/2018   Procedure: POSTERIOR LUMBAR SPINAL WOUND DEBRIDEMENT AND REVISION;  Surgeon: Judith Part, MD;  Location: [place];  Service: Neurosurgery;  Laterality: N/A;  POSTERIOR LUMBAR SPINAL WOUND REVISION   miscarrage  1962   OVARIAN CYST SURGERY  1968   thymus tumor     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   TONSILLECTOMY     Past Medical History:  Diagnosis Date   Abnormality of gait 04/19/2016   Acute infective polyneuritis ([place]) 2002   Allergic rhinitis due to pollen    Chronic pain syndrome    COPD (chronic obstructive pulmonary disease) (HCC)    chronic bronchitis   Depressive disorder, not elsewhere classified    Diabetes mellitus without complication ([place])    Diaphragmatic hernia without mention of obstruction or gangrene    Dyslipidemia    Fibromyalgia    GERD (gastroesophageal reflux disease)    with h/o esophagitis   Guillain-Barre syndrome (HCC)    History of benign thymus tumor    Insomnia, unspecified    Lumbar spinal stenosis 03/30/2018   L4-5 level, severe   Miscarriage 1962   Mixed hyperlipidemia    Morbid obesity ([place])    Osteoporosis    Pneumonia    Polyneuropathy in diabetes(357.2)    Rheumatoid arthritis(714.0)    Spondylosis, lumbosacral    Spontaneous ecchymoses  Type II or unspecified type diabetes mellitus with peripheral circulatory disorders, uncontrolled(250.72)     Unspecified chronic bronchitis (HCC)    Unspecified essential hypertension    Unspecified hypothyroidism    Unspecified pruritic disorder    Unspecified urinary incontinence    BP (!) 153/75   Pulse 92   Ht '5\' 6"'$  (1.676 m)   Wt 227 lb (103 kg)   SpO2 98%   BMI 36.64 kg/m   Opioid Risk Score:   Fall Risk Score:  `1  Depression screen [facility] 2/9     09/01/2022   10:53 AM 06/16/2022    9:46 AM 04/11/2022    8:22 AM 02/17/2022   11:22 AM 01/11/2022    9:09 AM 12/14/2021    9:15 AM 08/02/2021   10:15 AM  Depression screen PHQ 2/9  Decreased Interest 0 0 0 0 0 0 1  Down, Depressed, Hopeless 0 0 0 0 0 0 1  PHQ - 2 Score 0 0 0 0 0 0 2       Review of Systems  Constitutional: Negative.   HENT: Negative.    Eyes: Negative.   Respiratory: Negative.    Cardiovascular: Negative.   Gastrointestinal: Negative.   Endocrine: Negative.   Genitourinary: Negative.   Musculoskeletal:  Positive for back pain, gait problem and neck pain.       Pain in noth legs, pain  in right shoulder , pain in both knees , pain in both feet  Skin: Negative.   Allergic/Immunologic: Negative.   Hematological: Negative.   Psychiatric/Behavioral: Negative.         Objective:  Gen: no distress, normal appearing, weight 227 lbs, BMI 36.64 HEENT: oral mucosa pink and moist, NCAT Cardio: Reg rate Chest: normal effort, normal rate of breathing Abd: soft, non-distended Ext: no edema Psych: pleasant, normal affect Skin: intact Neuro: Alert and oriented x3, good medical hisorian MSK: Wheelchair bound    Assessment & Plan:  Failed Back Syndrome: Coninue HEP as Tolerated. Continue to Monitor. She is receiving Home Health therapy at this time, PT/OT two days a week. 03/11/2021. Prescribed lidocaine patch.  -Discussed current symptoms of pain and history of pain.  -recommended ginger daily -Discussed benefits of exercise in reducing pain. -increase Percocet so she can take up to 6 tablets per  day -discussed adding long acting but she defers -discussed trying Qutenza, but she did not have much benefit in the past.  -Discussed following foods that may reduce pain: 1) Ginger (especially studied for arthritis)- reduce leukotriene production to decrease inflammation 2) Blueberries- high in phytonutrients that decrease inflammation 3) Salmon- marine omega-3s reduce joint swelling and pain 4) Pumpkin seeds- reduce inflammation 5) dark chocolate- reduces inflammation 6) turmeric- reduces inflammation 7) tart cherries - reduce pain and stiffness 8) extra virgin olive oil - its compound olecanthal helps to block prostaglandins  9) chili peppers- can be eaten or applied topically via capsaicin 10) mint- helpful for headache, muscle aches, joint pain, and itching 11) garlic- reduces inflammation  Link to further information on diet for chronic pain: http://www.randall.[email or web address]  Lumbar Radiculitis: Continue Gabapentin. Continue to monitor. Continue HEP as Tolerated.03/11/2021 Left  Greater Trochanter Bursitis: Scheduled for left hip injection with Dr Ranell Patrick.Continue to Alternate Ice and Heat Therapy. Continue current medication regimen. Continue to monitor. 03/11/2021 Bilateral Primary OA: Continue HEP as Tolerated. Continue to Monitor. 03/11/2021 Insomnia: Continue Amitriptyline to '50mg'$ . Continue to Monitor.03/11/2021. Discussed trying tart cherry juice Insomnia: -Try to go outside near sunrise -Get exercise  during the day.  -Turn off all devices an hour before bedtime.  -Teas that can benefit: chamomile, valerian root, Brahmi (Bacopa) -Can consider over the counter melatonin, magnesium, and/or L-theanine. Melatonin is an anti-oxidant with multiple health benefits. Magnesium is involved in greater than 300 enzymatic reactions in the body and most of [place] are deficient as our soil is often depleted. There are 7 different types of  magnesium- Bioptemizer's is a supplement with all 7 types, and each has unique benefits. Magnesium can also help with constipation and anxiety.  -Pistachios naturally increase the production of melatonin -Cozy Earth bamboo bed sheets are free from toxic chemicals.  -Tart cherry juice or a tart cherry supplement can improve sleep and soreness post-workout       Chronic Pain Syndrome: Refilled Oxycodone 10/325 mg one tablet every 6 hours as needed for pain #120. We will continue the opioid monitoring program, this consists of regular clinic visits, examinations, urine drug screen, pill counts as well as use of [place] Controlled Substance Reporting system. Discussed the differences between oxycodone and oxycontin. Provided with a pain relief journal -will perform food sensitivity testing next visit given worsening diffuse aches and pains -commended on including mostly fruits and vegetables in her diet. -Discussed current symptoms of pain and history of pain.  -Discussed benefits of exercise in reducing pain. -Discussed following foods that may reduce pain: 1) Ginger (especially studied for arthritis)- reduce leukotriene production to decrease inflammation 2) Blueberries- high in phytonutrients that decrease inflammation 3) Salmon- marine omega-3s reduce joint swelling and pain 4) Pumpkin seeds- reduce inflammation 5) dark chocolate- reduces inflammation 6) turmeric- reduces inflammation 7) tart cherries - reduce pain and stiffness 8) extra virgin olive oil - its compound olecanthal helps to block prostaglandins  9) chili peppers- can be eaten or applied topically via capsaicin 10) mint- helpful for headache, muscle aches, joint pain, and itching 11) garlic- reduces inflammation  Link to further information on diet for chronic pain: http://www.randall.com/   Right hand decreased strength post-shingles: provided  referral for occupational therapy  8. Right sided psoriatic athritis: -will not try Qutenza again since did not help -continue percocet  -commended on eating anti-inflammatory foods.   -recommended doTerra Deep Blue Essential oil and applied to area of pain today- discussed that this is made of natural plant oils. Shared by personal experience of benefit from use of this essential oil -provided a link to a pdf of Pete Escogue's musculoskeletal alignment exercises: https://www.berger.biz/.pdf   -Provided with a pain relief journal and discussed that it contains foods and lifestyle tips to naturally help to improve pain. Discussed that these lifestyle strategies are also very good for health unlike some medications which can have negative side effects. Discussed that the act of keeping a journal can be therapeutic and helpful to realize patterns what helps to trigger and alleviate pain.    9) Peripheral neuropathy: -Discussed Qutenza as an option for neuropathic pain control. Discussed that this is a capsaicin patch, stronger than capsaicin cream. Discussed that it is currently approved for diabetic peripheral neuropathy and post-herpetic neuralgia, but that it has also shown benefit in treating other forms of neuropathy. Provided patient with link to site to learn more about the patch: CinemaBonus.fr. Discussed that the patch would be placed in office and benefits usually last 3 months. Discussed that unintended exposure to capsaicin can cause severe irritation of eyes, mucous membranes, respiratory tract, and skin, but that Qutenza is a local treatment and does not have  the systemic side effects of other nerve medications. Discussed that there may be pain, itching, erythema, and decreased sensory function associated with the application of Qutenza. Side effects usually subside within 1 week. A cold pack of analgesic medications can help with  these side effects. Blood pressure can also be increased due to pain associated with administration of the patch.   10) Foot fracture: -discussed that if she experiences a fracture in the future it is permissible for ortho to take over pain management temporarily as needed to manage acute pain -prescribed high dose vitamin D for pain management, to promote wound healing, and to reduce fracture risk in the future -encouraged discussion of PT with ortho at follow-up  11) History of pneumonia: -recommended ginger daily'  12) Opioid induced constipation -continue colace daily.  Constipation:  -Provided list of following foods that help with constipation and highlighted a few: 1) prunes- contain high amounts of fiber.  2) apples- has a form of dietary fiber called pectin that accelerates stool movement and increases beneficial gut bacteria 3) pears- in addition to fiber, also high in fructose and sorbitol which have laxative effect 4) figs- contain an enzyme ficin which helps to speed colonic transit 5) kiwis- contain an enzyme actinidin that improves gut motility and reduces constipation 6) oranges- rich in pectin (like apples) 7) grapefruits- contain a flavanol naringenin which has a laxative effect 8) vegetables- rich in fiber and also great sources of folate, vitamin C, and K 9) artichoke- high in inulin, prebiotic great for the microbiome 10) chicory- increases stool frequency and softness (can be added to coffee) 11) rhubarb- laxative effect 12) sweet potato- high fiber 13) beans, peas, and lentils- contain both soluble and insoluble fiber 14) chia seeds- improves intestinal health and gut flora 15) flaxseeds- laxative effect 16) whole grain rye bread- high in fiber 17) oat bran- high in soluble and insoluble fiber 18) kefir- softens stools -recommended to try at least one of these foods every day.  -drink 6-8 glasses of water per day -walk regularly, especially after meals.

## 2022-09-01 NOTE — Progress Notes (Deleted)
Subjective:    Patient ID: Katherine Delgado, female    DOB: 07/03/1941, 82 y.o.   MRN: [id number]  HPI  Pain Inventory Average Pain 8 Pain Right Now 9 My pain is sharp, burning, stabbing, tingling, and aching  In the last 24 hours, has pain interfered with the following? General activity 10 Relation with others 10 Enjoyment of life 10 What TIME of day is your pain at its worst? morning  and night Sleep (in general) Poor  Pain is worse with: walking, bending, sitting, standing, and some activites Pain improves with: rest and medication Relief from Meds: 8  Family History  Problem Relation Age of Onset  . Alzheimer's disease Mother   . Heart disease Mother   . Heart disease Father   . Liver disease Father   . Cancer Brother   . Arthritis Son   . Colon polyps Brother   . Colon cancer Neg Hx   . Esophageal cancer Neg Hx   . Kidney disease Neg Hx   . Stomach cancer Neg Hx   . Rectal cancer Neg Hx    Social History   Socioeconomic History  . Marital status: Widowed    Spouse name: Not on file  . Number of children: 2  . Years of education: 78  . Highest education level: Not on file  Occupational History  . Occupation: Retired  Tobacco Use  . Smoking status: Former    Packs/day: 0.10    Years: 10.00    Total pack years: 1.00    Types: Cigarettes    Quit date: 12/07/1979    Years since quitting: 42.7    Passive exposure: Current  . Smokeless tobacco: Never  Vaping Use  . Vaping Use: Never used  Substance and Sexual Activity  . Alcohol use: No    Alcohol/week: 0.0 standard drinks of alcohol  . Drug use: No  . Sexual activity: Not Currently  Other Topics Concern  . Not on file  Social History Narrative   Walks with cane   Right handed    Caffeine use: Coffee (2 cups every morning)   Tea: sometimes   Soda: none   Social Determinants of Health   Financial Resource Strain: Medium Risk (10/05/2017)   Overall Financial Resource Strain (CARDIA)   . Difficulty  of Paying Living Expenses: Somewhat hard  Food Insecurity: Food Insecurity Present (10/05/2017)   Hunger Vital Sign   . Worried About Charity fundraiser in the Last Year: Sometimes true   . Ran Out of Food in the Last Year: Sometimes true  Transportation Needs: No Transportation Needs (10/05/2017)   PRAPARE - Transportation   . Lack of Transportation (Medical): No   . Lack of Transportation (Non-Medical): No  Physical Activity: Inactive (10/05/2017)   Exercise Vital Sign   . Days of Exercise per Week: 0 days   . Minutes of Exercise per Session: 0 min  Stress: Stress Concern Present (10/05/2017)   [place]   . Feeling of Stress : Rather much  Social Connections: Moderately Isolated (10/05/2017)   Social Connection and Isolation Panel [NHANES]   . Frequency of Communication with Friends and Family: More than three times a week   . Frequency of Social Gatherings with Friends and Family: More than three times a week   . Attends Religious Services: Never   . Active Member of Clubs or Organizations: No   . Attends Archivist Meetings: Never   .  Marital Status: Widowed   Past Surgical History:  Procedure Laterality Date  . ABDOMINAL HYSTERECTOMY  1974  . abdominal tumor  2002  . APPENDECTOMY    . APPLICATION OF A-CELL OF BACK N/A 09/26/2018   Procedure: With Acell;  Surgeon: Wallace Going, DO;  Location: [facility];  Service: Plastics;  Laterality: N/A;  . APPLICATION OF WOUND VAC N/A 09/26/2018   Procedure: Vac placement;  Surgeon: Wallace Going, DO;  Location: [facility];  Service: Plastics;  Laterality: N/A;  . [place]  . BRONCHOSCOPY  2001  . CATARACT EXTRACTION, BILATERAL  09/2019, 10/2019  . CHOLECYSTECTOMY  1984  . INCISION AND DRAINAGE OF WOUND N/A 09/26/2018   Procedure: Debridement of spine wound;  Surgeon: Wallace Going, DO;  Location: [facility];  Service: Plastics;  Laterality: N/A;  .  KNEE SURGERY Bilateral 08/27/2010 (L) and 01/11/2011 (R)  . LUMBAR LAMINECTOMY/DECOMPRESSION MICRODISCECTOMY N/A 07/03/2018   Procedure: Lumbar Three to Lumbar Five Laminectomy;  Surgeon: Judith Part, MD;  Location: [facility];  Service: Neurosurgery;  Laterality: N/A;  . LUMBAR WOUND DEBRIDEMENT N/A 08/20/2018   Procedure: POSTERIOR LUMBAR SPINAL WOUND DEBRIDEMENT AND REVISION;  Surgeon: Judith Part, MD;  Location: [facility];  Service: Neurosurgery;  Laterality: N/A;  POSTERIOR LUMBAR SPINAL WOUND REVISION  . [place]  . OVARIAN CYST SURGERY  1968  . thymus tumor    . 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 TONSILLECTOMY     Past Surgical History:  Procedure Laterality Date  . ABDOMINAL HYSTERECTOMY  1974  . abdominal tumor  2002  . APPENDECTOMY    . APPLICATION OF A-CELL OF BACK N/A 09/26/2018   Procedure: With Acell;  Surgeon: Wallace Going, DO;  Location: [facility];  Service: Plastics;  Laterality: N/A;  . APPLICATION OF WOUND VAC N/A 09/26/2018   Procedure: Vac placement;  Surgeon: Wallace Going, DO;  Location: [facility];  Service: Plastics;  Laterality: N/A;  . [place]  . BRONCHOSCOPY  2001  . CATARACT EXTRACTION, BILATERAL  09/2019, 10/2019  . CHOLECYSTECTOMY  1984  . INCISION AND DRAINAGE OF WOUND N/A 09/26/2018   Procedure: Debridement of spine wound;  Surgeon: Wallace Going, DO;  Location: [facility];  Service: Plastics;  Laterality: N/A;  . KNEE SURGERY Bilateral 08/27/2010 (L) and 01/11/2011 (R)  . LUMBAR LAMINECTOMY/DECOMPRESSION MICRODISCECTOMY N/A 07/03/2018   Procedure: Lumbar Three to Lumbar Five Laminectomy;  Surgeon: Judith Part, MD;  Location: [facility];  Service: Neurosurgery;  Laterality: N/A;  . LUMBAR WOUND DEBRIDEMENT N/A 08/20/2018   Procedure: POSTERIOR LUMBAR SPINAL WOUND DEBRIDEMENT AND REVISION;  Surgeon: Judith Part, MD;  Location: [facility];  Service: Neurosurgery;  Laterality: N/A;  POSTERIOR LUMBAR SPINAL WOUND REVISION  . [place]  . OVARIAN CYST SURGERY  1968  . thymus tumor    . 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 TONSILLECTOMY     Past Medical History:  Diagnosis Date  . Abnormality of gait 04/19/2016  . Acute infective polyneuritis ([place]) 2002  . Allergic rhinitis due to pollen   . Chronic pain syndrome   . COPD (chronic obstructive pulmonary disease) (HCC)    chronic bronchitis  . Depressive disorder, not elsewhere classified   . Diabetes mellitus without complication ([place])   . Diaphragmatic hernia without mention of obstruction or gangrene   . Dyslipidemia   . Fibromyalgia   . GERD (gastroesophageal reflux disease)    with  h/o esophagitis  . Guillain-Barre syndrome ([place])   . History of benign thymus tumor   . Insomnia, unspecified   . Lumbar spinal stenosis 03/30/2018   L4-5 level, severe  . Miscarriage 1962  . Mixed hyperlipidemia   . Morbid obesity ([place])   . Osteoporosis   . Pneumonia   . Polyneuropathy in diabetes(357.2)   . Rheumatoid arthritis(714.0)   . Spondylosis, lumbosacral   . Spontaneous ecchymoses   . Type II or unspecified type diabetes mellitus with peripheral circulatory disorders, uncontrolled(250.72)   . Unspecified chronic bronchitis ([place])   . Unspecified essential hypertension   . Unspecified hypothyroidism   . Unspecified pruritic disorder   . Unspecified urinary incontinence    BP (!) 153/75   Pulse 92   Ht '5\' 6"'$  (1.676 m)   Wt 227 lb (103 kg)   SpO2 98%   BMI 36.64 kg/m   Opioid Risk Score:   Fall Risk Score:  `1  Depression screen [place] 2/9     09/01/2022   10:53 AM 06/16/2022    9:46 AM 04/11/2022    8:22 AM 02/17/2022   11:22 AM 01/11/2022    9:09 AM 12/14/2021    9:15 AM 08/02/2021   10:15 AM  Depression screen PHQ 2/9  Decreased Interest 0 0 0 0 0 0 1  Down, Depressed, Hopeless 0 0 0 0 0 0 1  PHQ - 2 Score 0 0 0 0 0 0 2     Review of Systems  Musculoskeletal:  Positive for back pain and neck pain.  All other systems reviewed and are negative.      Objective:   Physical Exam        Assessment & Plan:

## 2022-09-01 NOTE — Addendum Note (Signed)
Addended by: Izora Ribas on: 09/01/2022 02:08 PM   Modules accepted: Orders

## 2022-09-01 NOTE — Patient Instructions (Signed)
Constipation:  -Provided list of following foods that help with constipation and highlighted a few: 1) prunes- contain high amounts of fiber.  2) apples- has a form of dietary fiber called pectin that accelerates stool movement and increases beneficial gut bacteria 3) pears- in addition to fiber, also high in fructose and sorbitol which have laxative effect 4) figs- contain an enzyme ficin which helps to speed colonic transit 5) kiwis- contain an enzyme actinidin that improves gut motility and reduces constipation 6) oranges- rich in pectin (like apples) 7) grapefruits- contain a flavanol naringenin which has a laxative effect 8) vegetables- rich in fiber and also great sources of folate, vitamin C, and K 9) artichoke- high in inulin, prebiotic great for the microbiome 10) chicory- increases stool frequency and softness (can be added to coffee) 11) rhubarb- laxative effect 12) sweet potato- high fiber 13) beans, peas, and lentils- contain both soluble and insoluble fiber 14) chia seeds- improves intestinal health and gut flora 15) flaxseeds- laxative effect 16) whole grain rye bread- high in fiber 17) oat bran- high in soluble and insoluble fiber 18) kefir- softens stools -recommended to try at least one of these foods every day.  -drink 6-8 glasses of water per day -walk regularly, especially after meals.      

## 2022-09-07 ENCOUNTER — Ambulatory Visit: Payer: Medicare Other | Admitting: Internal Medicine

## 2022-09-09 ENCOUNTER — Telehealth: Payer: Self-pay

## 2022-09-09 NOTE — Telephone Encounter (Signed)
Incoming call received from patient to inquire about urine culture results from 08/31/22. Patient questioned why she has not heard anything.  I explained to patient that urine culture was negative for a urinary tract infection and if results are normal or negative we do not call, if a patient is active on mychart. Patient verbalized understanding.

## 2022-09-10 IMAGING — DX DG CHEST 2V
2 series · 2 of 2 positions shown · non-contrast
Comparison: 01/08/2016

CLINICAL DATA: COPD

EXAM:
CHEST - 2 VIEW

[dg chest 2 view (1 of 2)]
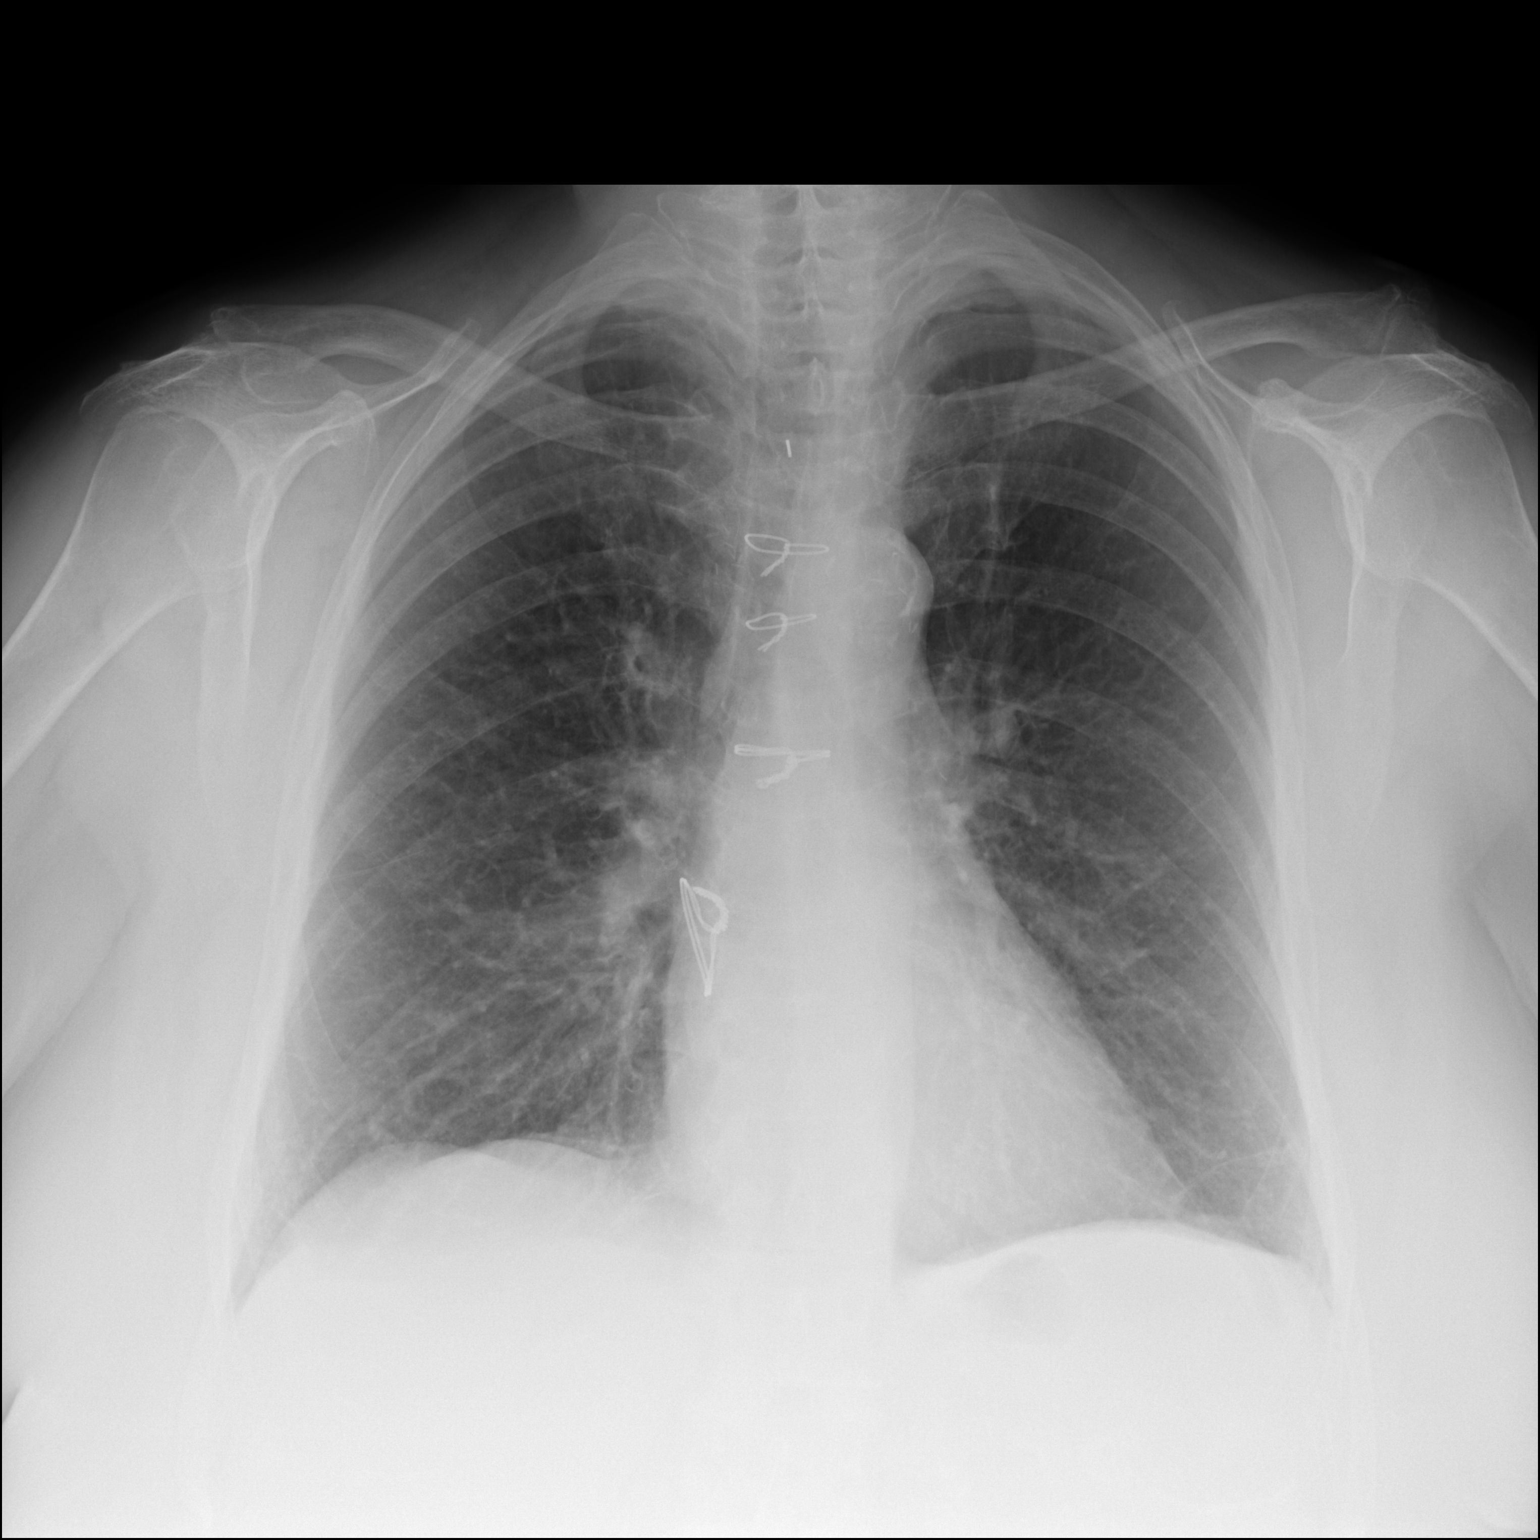

[dg chest 2 view (2 of 2)]
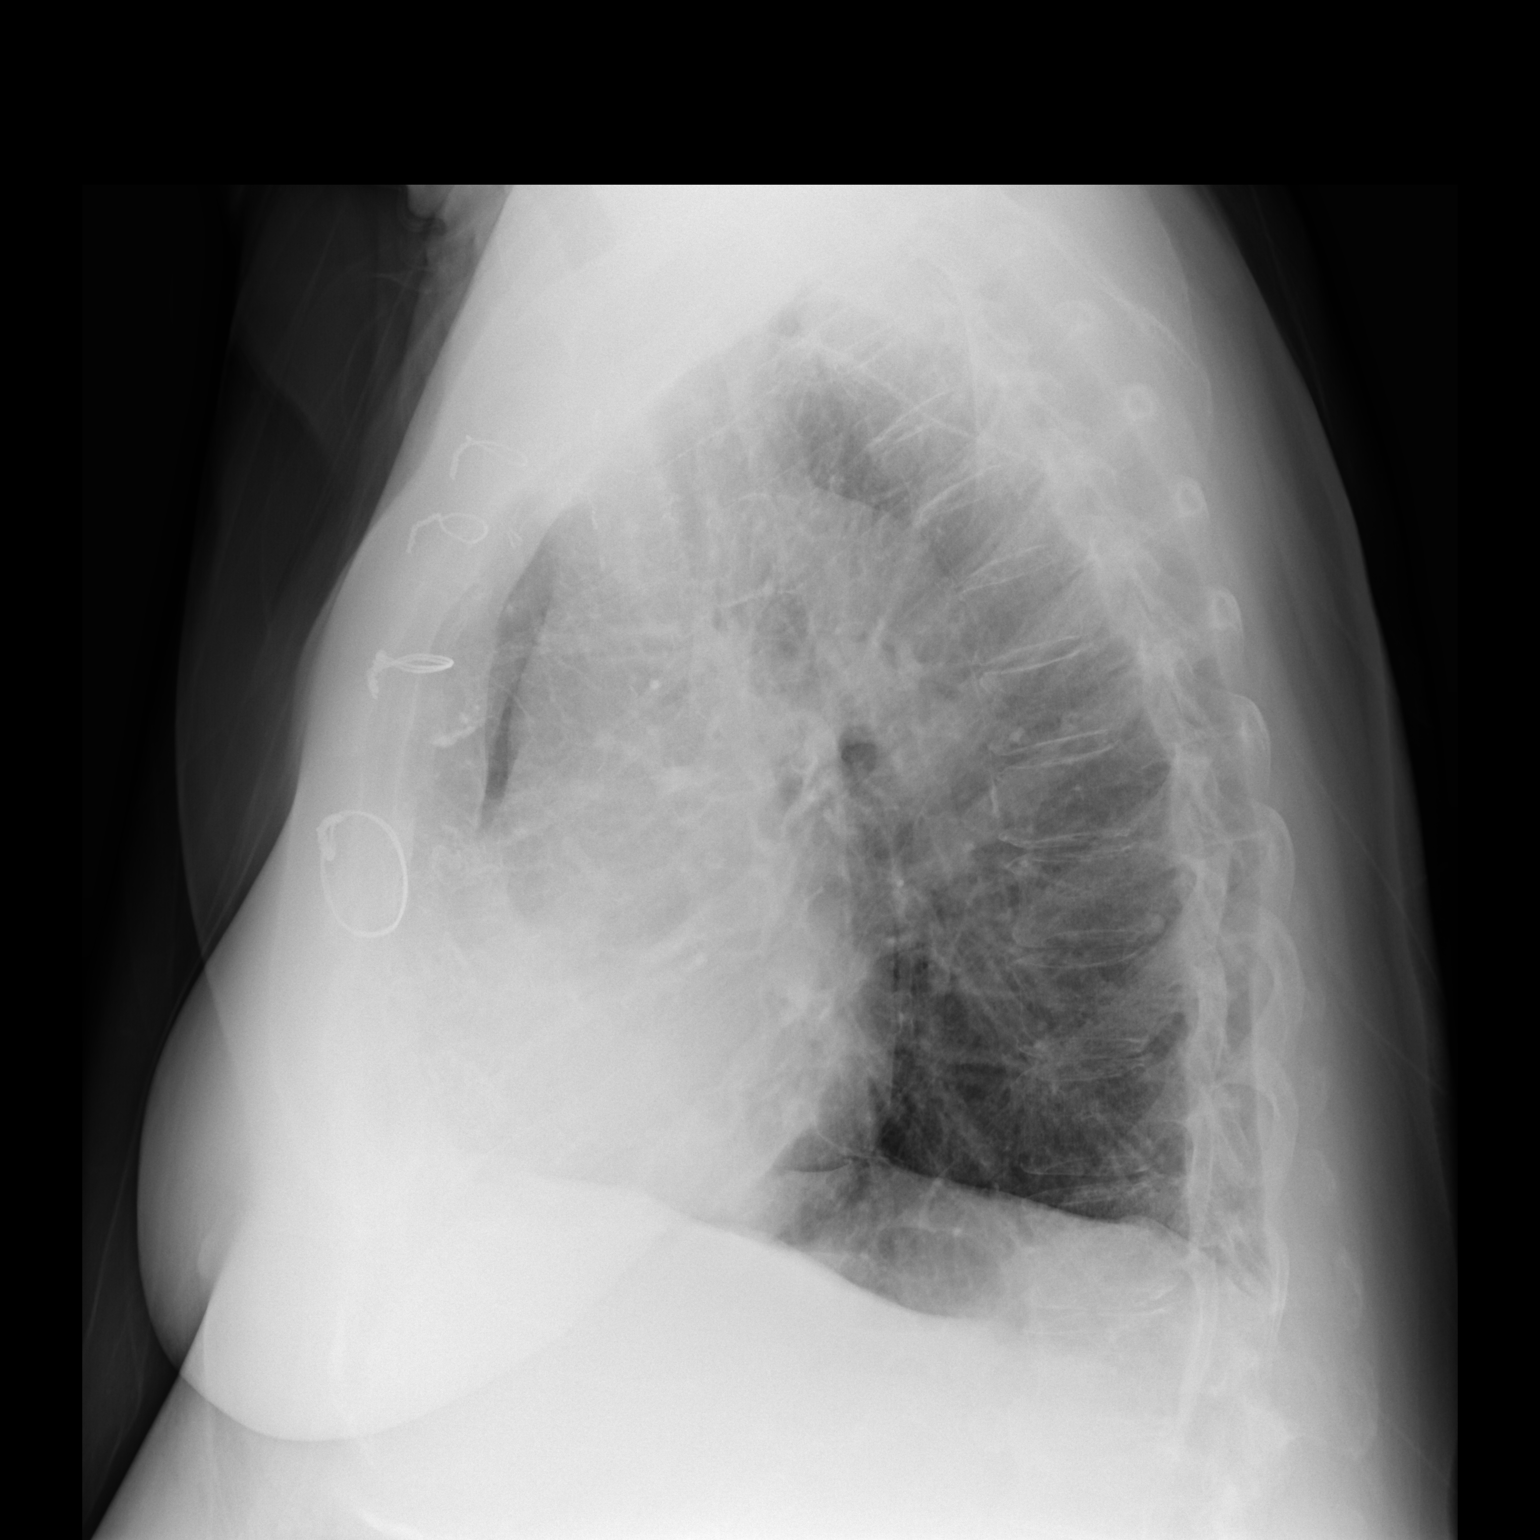

[2 of 2 positions shown; findings below may reference images not displayed]

FINDINGS: Prior median sternotomy. Normal heart size. Atherosclerotic
calcification of the aortic knob. Minimal linear scarring within the
periphery of the left lung base. No focal airspace consolidation,
pleural effusion, or pneumothorax.
IMPRESSION: No active cardiopulmonary disease.

## 2022-09-12 ENCOUNTER — Ambulatory Visit: Payer: Medicare Other | Admitting: Physician Assistant

## 2022-09-12 DIAGNOSIS — M797 Fibromyalgia: Secondary | ICD-10-CM

## 2022-09-12 DIAGNOSIS — Z8719 Personal history of other diseases of the digestive system: Secondary | ICD-10-CM

## 2022-09-12 DIAGNOSIS — M5136 Other intervertebral disc degeneration, lumbar region: Secondary | ICD-10-CM

## 2022-09-12 DIAGNOSIS — M170 Bilateral primary osteoarthritis of knee: Secondary | ICD-10-CM

## 2022-09-12 DIAGNOSIS — G4709 Other insomnia: Secondary | ICD-10-CM

## 2022-09-12 DIAGNOSIS — B029 Zoster without complications: Secondary | ICD-10-CM

## 2022-09-12 DIAGNOSIS — Z8709 Personal history of other diseases of the respiratory system: Secondary | ICD-10-CM

## 2022-09-12 DIAGNOSIS — M810 Age-related osteoporosis without current pathological fracture: Secondary | ICD-10-CM

## 2022-09-12 DIAGNOSIS — M3501 Sicca syndrome with keratoconjunctivitis: Secondary | ICD-10-CM

## 2022-09-12 DIAGNOSIS — Z8669 Personal history of other diseases of the nervous system and sense organs: Secondary | ICD-10-CM

## 2022-09-12 DIAGNOSIS — Z8639 Personal history of other endocrine, nutritional and metabolic disease: Secondary | ICD-10-CM

## 2022-09-12 DIAGNOSIS — Z8659 Personal history of other mental and behavioral disorders: Secondary | ICD-10-CM

## 2022-09-12 DIAGNOSIS — M0579 Rheumatoid arthritis with rheumatoid factor of multiple sites without organ or systems involvement: Secondary | ICD-10-CM

## 2022-09-12 DIAGNOSIS — Z79899 Other long term (current) drug therapy: Secondary | ICD-10-CM

## 2022-09-12 DIAGNOSIS — Z8679 Personal history of other diseases of the circulatory system: Secondary | ICD-10-CM

## 2022-09-16 NOTE — Progress Notes (Deleted)
Office Visit Note  Patient: Katherine Delgado             Date of Birth: 10-22-1940           MRN: [id number]             PCP: Sandrea Hughs, NP Referring: Sandrea Hughs, NP Visit Date: 09/28/2022 Occupation: '@GUAROCC'$ @  Subjective:    History of Present Illness: SUKHMANI FETHEROLF is a 82 y.o. female with history of seropositive rheumatoid arthritis, Sjogren's syndrome, fibromyalgia, osteoporosis, and osteoarthritis.  Patient remains on Xeljanz 5 mg 1 tablet by mouth daily     Activities of Daily Living:  Patient reports morning stiffness for *** {minute/hour:19697}.   Patient {ACTIONS;DENIES/REPORTS:21021675::"Denies"} nocturnal pain.  Difficulty dressing/grooming: {ACTIONS;DENIES/REPORTS:21021675::"Denies"} Difficulty climbing stairs: {ACTIONS;DENIES/REPORTS:21021675::"Denies"} Difficulty getting out of chair: {ACTIONS;DENIES/REPORTS:21021675::"Denies"} Difficulty using hands for taps, buttons, cutlery, and/or writing: {ACTIONS;DENIES/REPORTS:21021675::"Denies"}  No Rheumatology ROS completed.   PMFS History:  Patient Active Problem List   Diagnosis Date Noted  . Left chronic serous otitis media 05/11/2021  . 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 Primary osteoarthritis of right knee 07/25/2019  . Wound dehiscence 08/17/2018  . Lumbar spinal stenosis 03/30/2018  . Rheumatoid arthritis involving both wrists with positive rheumatoid factor ([place]) 06/14/2016  . High risk medication use 06/14/2016  . Sjogren's syndrome ([place]) 06/14/2016  . Primary osteoarthritis of both knees 06/14/2016  . DDD (degenerative disc disease), lumbar 06/14/2016  . Hypothyroidism 06/14/2016  . Osteoporosis 06/14/2016  . Abnormality of gait 04/19/2016  . Type 2 diabetes mellitus with diabetic polyneuropathy, with long-term current use of insulin ([place]) 11/27/2015  . Diabetes  mellitus without complication ([place]) 42/35/3614  . Headache(784.0) 07/31/2013  . Sinus infection 07/31/2013  . Chronic right-sided low back pain with right-sided sciatica 03/14/2013  . Insomnia 12/06/2012  . Nausea alone 12/06/2012  . Diarrhea 12/06/2012  . Urinary incontinence, urge 12/06/2012  . Lumbosacral root lesions, not elsewhere classified 11/27/2012  . Diabetic polyneuropathy associated with type 2 diabetes mellitus ([place]) 11/27/2012  . EDEMA 05/04/2010  . YEAST INFECTION 05/03/2010  . Hyperlipidemia 05/03/2010  . DEPRESSION 05/03/2010  . History of peripheral neuropathy 05/03/2010  . Essential hypertension 05/03/2010  . ALLERGIC RHINITIS 05/03/2010  . PNEUMONIA 05/03/2010  . COPD (chronic obstructive pulmonary disease) ([place]) 05/03/2010  . GERD 05/03/2010  . Fibromyalgia 05/03/2010  . DYSPNEA 05/03/2010  . CHEST PAIN 05/03/2010    Past Medical History:  Diagnosis Date  . Abnormality of gait 04/19/2016  . Acute infective polyneuritis ([place]) 2002  . Allergic rhinitis due to pollen   . Chronic pain syndrome   . COPD (chronic obstructive pulmonary disease) (HCC)    chronic bronchitis  . Depressive disorder, not elsewhere classified   . Diabetes mellitus without complication ([place])   . Diaphragmatic hernia without mention of obstruction or gangrene   . Dyslipidemia   . Fibromyalgia   . GERD (gastroesophageal reflux disease)    with h/o esophagitis  . Guillain-Barre syndrome ([place])   . History of benign thymus tumor   . Insomnia, unspecified   . Lumbar spinal stenosis 03/30/2018   L4-5 level, severe  . Miscarriage 1962  . Mixed hyperlipidemia   . Morbid obesity ([place])   . Osteoporosis   . Pneumonia   . Polyneuropathy in diabetes(357.2)   . Rheumatoid arthritis(714.0)   . Spondylosis, lumbosacral   . Spontaneous ecchymoses   .  Type II or unspecified type diabetes mellitus with peripheral circulatory disorders, uncontrolled(250.72)   . Unspecified chronic bronchitis  ([place])   . Unspecified essential hypertension   . Unspecified hypothyroidism   . Unspecified pruritic disorder   . Unspecified urinary incontinence     Family History  Problem Relation Age of Onset  . Alzheimer's disease Mother   . Heart disease Mother   . Heart disease Father   . Liver disease Father   . Cancer Brother   . Arthritis Son   . Colon polyps Brother   . Colon cancer Neg Hx   . Esophageal cancer Neg Hx   . Kidney disease Neg Hx   . Stomach cancer Neg Hx   . Rectal cancer Neg Hx    Past Surgical History:  Procedure Laterality Date  . ABDOMINAL HYSTERECTOMY  1974  . abdominal tumor  2002  . APPENDECTOMY    . APPLICATION OF A-CELL OF BACK N/A 09/26/2018   Procedure: With Acell;  Surgeon: Wallace Going, DO;  Location: [facility];  Service: Plastics;  Laterality: N/A;  . APPLICATION OF WOUND VAC N/A 09/26/2018   Procedure: Vac placement;  Surgeon: Wallace Going, DO;  Location: [facility];  Service: Plastics;  Laterality: N/A;  . [place]  . BRONCHOSCOPY  2001  . CATARACT EXTRACTION, BILATERAL  09/2019, 10/2019  . CHOLECYSTECTOMY  1984  . INCISION AND DRAINAGE OF WOUND N/A 09/26/2018   Procedure: Debridement of spine wound;  Surgeon: Wallace Going, DO;  Location: [facility];  Service: Plastics;  Laterality: N/A;  . KNEE SURGERY Bilateral 08/27/2010 (L) and 01/11/2011 (R)  . LUMBAR LAMINECTOMY/DECOMPRESSION MICRODISCECTOMY N/A 07/03/2018   Procedure: Lumbar Three to Lumbar Five Laminectomy;  Surgeon: Judith Part, MD;  Location: [facility];  Service: Neurosurgery;  Laterality: N/A;  . LUMBAR WOUND DEBRIDEMENT N/A 08/20/2018   Procedure: POSTERIOR LUMBAR SPINAL WOUND DEBRIDEMENT AND REVISION;  Surgeon: Judith Part, MD;  Location: [facility];  Service: Neurosurgery;  Laterality: N/A;  POSTERIOR LUMBAR SPINAL WOUND REVISION  . [place]  . OVARIAN CYST SURGERY  1968  . thymus tumor    . 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 TONSILLECTOMY     Social History    Social History Narrative   Walks with cane   Right handed    Caffeine use: Coffee (2 cups every morning)   Tea: sometimes   Soda: none   Immunization History  Administered Date(s) Administered  . Influenza-Unspecified 06/10/2011  . PFIZER([place])SARS-COV-2 Vaccination 11/13/2019, 11/30/2019  . Pneumococcal Conjugate-13 07/07/2016  . Pneumococcal Polysaccharide-23 11/21/2003, 10/05/2017  . Tdap 05/28/2016     Objective: Vital Signs: There were no vitals taken for this visit.   Physical Exam Vitals and nursing note reviewed.  Constitutional:      Appearance: She is well-developed.  HENT:     Head: Normocephalic and atraumatic.  Eyes:     Conjunctiva/sclera: Conjunctivae normal.  Cardiovascular:     Rate and Rhythm: Normal rate and regular rhythm.     Heart sounds: Normal heart sounds.  Pulmonary:     Effort: Pulmonary effort is normal.     Breath sounds: Normal breath sounds.  Abdominal:     General: Bowel sounds are normal.     Palpations: Abdomen is soft.  Musculoskeletal:     Cervical back: Normal range of motion.  Skin:    General: Skin is warm and dry.     Capillary Refill: Capillary refill takes  less than 2 seconds.  Neurological:     Mental Status: She is alert and oriented to person, place, and time.  Psychiatric:        Behavior: Behavior normal.     Musculoskeletal Exam: ***  CDAI Exam: CDAI Score: -- Patient Global: --; Provider Global: -- Swollen: --; Tender: -- Joint Exam 09/28/2022   No joint exam has been documented for this visit   There is currently no information documented on the homunculus. Go to the Rheumatology activity and complete the homunculus joint exam.  Investigation: No additional findings.  Imaging: No results found.  Recent Labs: Lab Results  Component Value Date   WBC 13.2 (H) 03/03/2022   HGB 14.2 03/03/2022   PLT 338 03/03/2022   NA 143 03/03/2022   K 3.7 03/03/2022   CL 102 03/03/2022   CO2 29  03/03/2022   GLUCOSE 41 (L) 03/03/2022   BUN 16 03/03/2022   CREATININE 1.03 (H) 03/03/2022   BILITOT 0.4 03/03/2022   ALKPHOS 96 01/25/2021   AST 17 03/03/2022   ALT 10 03/03/2022   PROT 6.9 03/03/2022   ALBUMIN 3.5 01/25/2021   CALCIUM 9.6 03/03/2022   GFRAA 65 02/12/2021   QFTBGOLDPLUS NEGATIVE 09/30/2021    Speciality Comments: No specialty comments available.  Procedures:  No procedures performed Allergies: Influenza vaccines, Penicillins, Statins, and Sulfamethoxazole-trimethoprim   Assessment / Plan:     Visit Diagnoses: No diagnosis found.  Orders: No orders of the defined types were placed in this encounter.  No orders of the defined types were placed in this encounter.   Face-to-face time spent with patient was *** minutes. Greater than 50% of time was spent in counseling and coordination of care.  Follow-Up Instructions: No follow-ups on file.   Earnestine Mealing, CMA  Note - This record has been created using Editor, commissioning.  Chart creation errors have been sought, but may not always  have been located. Such creation errors do not reflect on  the standard of medical care.

## 2022-09-20 ENCOUNTER — Other Ambulatory Visit: Payer: Self-pay | Admitting: Family

## 2022-09-20 ENCOUNTER — Other Ambulatory Visit: Payer: Self-pay | Admitting: Internal Medicine

## 2022-09-24 ENCOUNTER — Other Ambulatory Visit: Payer: Self-pay | Admitting: Family

## 2022-09-28 ENCOUNTER — Other Ambulatory Visit: Payer: Self-pay | Admitting: Adult Health

## 2022-09-28 ENCOUNTER — Ambulatory Visit: Payer: Medicare Other | Admitting: Physician Assistant

## 2022-09-28 ENCOUNTER — Telehealth (INDEPENDENT_AMBULATORY_CARE_PROVIDER_SITE_OTHER): Payer: Medicare Other | Admitting: Adult Health

## 2022-09-28 DIAGNOSIS — J019 Acute sinusitis, unspecified: Secondary | ICD-10-CM

## 2022-09-28 DIAGNOSIS — Z8719 Personal history of other diseases of the digestive system: Secondary | ICD-10-CM

## 2022-09-28 DIAGNOSIS — Z8659 Personal history of other mental and behavioral disorders: Secondary | ICD-10-CM

## 2022-09-28 DIAGNOSIS — B029 Zoster without complications: Secondary | ICD-10-CM

## 2022-09-28 DIAGNOSIS — Z8709 Personal history of other diseases of the respiratory system: Secondary | ICD-10-CM

## 2022-09-28 DIAGNOSIS — M170 Bilateral primary osteoarthritis of knee: Secondary | ICD-10-CM

## 2022-09-28 DIAGNOSIS — M5136 Other intervertebral disc degeneration, lumbar region: Secondary | ICD-10-CM

## 2022-09-28 DIAGNOSIS — Z8679 Personal history of other diseases of the circulatory system: Secondary | ICD-10-CM

## 2022-09-28 DIAGNOSIS — Z79899 Other long term (current) drug therapy: Secondary | ICD-10-CM

## 2022-09-28 DIAGNOSIS — Z8639 Personal history of other endocrine, nutritional and metabolic disease: Secondary | ICD-10-CM

## 2022-09-28 DIAGNOSIS — M0579 Rheumatoid arthritis with rheumatoid factor of multiple sites without organ or systems involvement: Secondary | ICD-10-CM

## 2022-09-28 DIAGNOSIS — M3501 Sicca syndrome with keratoconjunctivitis: Secondary | ICD-10-CM

## 2022-09-28 DIAGNOSIS — G4709 Other insomnia: Secondary | ICD-10-CM

## 2022-09-28 DIAGNOSIS — M797 Fibromyalgia: Secondary | ICD-10-CM

## 2022-09-28 DIAGNOSIS — Z8669 Personal history of other diseases of the nervous system and sense organs: Secondary | ICD-10-CM

## 2022-09-28 DIAGNOSIS — M810 Age-related osteoporosis without current pathological fracture: Secondary | ICD-10-CM

## 2022-09-28 MED ORDER — DOXYCYCLINE HYCLATE 100 MG PO TABS: 100.0000 mg | ORAL_TABLET | Freq: Two times a day (BID) | ORAL | 0 refills | Status: DC

## 2022-09-28 MED ORDER — FLUTICASONE PROPIONATE 50 MCG/ACT NA SUSP: 2.0000 | Freq: Every evening | NASAL | 0 refills | Status: DC

## 2022-09-28 NOTE — Progress Notes (Signed)
This is a telephone visit. Unable to obtain a video link. Unable to do a physical exam; unable to get vital signs.   She is complaining since the end of last week she has been having cough with sputum: light [place]; fevers up to 101; chest pain due to cough; body aches and pains; has had headaches; chills poor appetite. She has been taking mucinex twice daily; alkaselzter plus; and tylenol. She is not getting better states that she is getting worse. She does not think that this is covid or flu. She has not performed a covid test. She did not feel as though she could make it to the office today. She denies any nausea vomiting or diarrhea. She is not having any wheezing    Unable to perform physical exam. She does sound like she is sick  Will begin doxycycline 100 mg twice daily for 14 days Will continue mucinex for 14 days Will begin flonase nightly until bottle is finished  She has been instructed that if she is not feeling better over the next that she will need to come to office for a physical exam.

## 2022-09-29 ENCOUNTER — Telehealth: Payer: Self-pay | Admitting: *Deleted

## 2022-09-29 NOTE — Telephone Encounter (Signed)
Patient called and stated that she had a Telephone visit done yesterday and prescribed an antibiotic, Doxycyline.   Patient stated that she has take 2 doses and Nauseated, Vomiting and Headache. Patient feels that she is Allergic to it and Requesting a ZPac to be called in instead.   Please Advise.

## 2022-09-29 NOTE — Telephone Encounter (Signed)
Gerlene Fee, NP  You4 minutes ago (11:31 AM)   She needs to take this with meals; not on empty stomach; if not better today will change in the AM       Patient notified and agreed.

## 2022-09-30 DIAGNOSIS — Z794 Long term (current) use of insulin: Secondary | ICD-10-CM | POA: Diagnosis not present

## 2022-09-30 DIAGNOSIS — E1142 Type 2 diabetes mellitus with diabetic polyneuropathy: Secondary | ICD-10-CM | POA: Diagnosis not present

## 2022-10-03 ENCOUNTER — Telehealth: Payer: Self-pay | Admitting: Registered Nurse

## 2022-10-03 ENCOUNTER — Encounter: Payer: Self-pay | Admitting: Orthopedic Surgery

## 2022-10-03 ENCOUNTER — Ambulatory Visit: Payer: Medicare Other | Admitting: Internal Medicine

## 2022-10-03 ENCOUNTER — Telehealth: Payer: Self-pay

## 2022-10-03 ENCOUNTER — Encounter: Payer: Self-pay | Admitting: Internal Medicine

## 2022-10-03 ENCOUNTER — Encounter: Payer: Medicare Other | Admitting: Registered Nurse

## 2022-10-03 ENCOUNTER — Ambulatory Visit (INDEPENDENT_AMBULATORY_CARE_PROVIDER_SITE_OTHER): Payer: Medicare Other | Admitting: Orthopedic Surgery

## 2022-10-03 VITALS — BP 132/88 | HR 63 | Temp 97.3°F | Resp 18 | Ht 66.0 in | Wt 222.0 lb

## 2022-10-03 VITALS — BP 122/70 | HR 66 | Ht 66.0 in | Wt 222.0 lb

## 2022-10-03 DIAGNOSIS — E039 Hypothyroidism, unspecified: Secondary | ICD-10-CM | POA: Diagnosis not present

## 2022-10-03 DIAGNOSIS — Z794 Long term (current) use of insulin: Secondary | ICD-10-CM

## 2022-10-03 DIAGNOSIS — R051 Acute cough: Secondary | ICD-10-CM | POA: Diagnosis not present

## 2022-10-03 DIAGNOSIS — U071 COVID-19: Secondary | ICD-10-CM | POA: Diagnosis not present

## 2022-10-03 DIAGNOSIS — R110 Nausea: Secondary | ICD-10-CM | POA: Diagnosis not present

## 2022-10-03 DIAGNOSIS — E1142 Type 2 diabetes mellitus with diabetic polyneuropathy: Secondary | ICD-10-CM | POA: Diagnosis not present

## 2022-10-03 LAB — POC COVID19 BINAXNOW: SARS Coronavirus 2 Ag: POSITIVE — AB

## 2022-10-03 LAB — POCT GLYCOSYLATED HEMOGLOBIN (HGB A1C): Hemoglobin A1C: 6.7 % — AB (ref 4.0–5.6)

## 2022-10-03 LAB — TSH: TSH: 1.41 u[IU]/mL (ref 0.35–5.50)

## 2022-10-03 MED ORDER — HUMALOG KWIKPEN 200 UNIT/ML ~~LOC~~ SOPN: PEN_INJECTOR | SUBCUTANEOUS | 1 refills | Status: DC

## 2022-10-03 MED ORDER — ONDANSETRON HCL 4 MG PO TABS: 4.0000 mg | ORAL_TABLET | Freq: Three times a day (TID) | ORAL | 0 refills | Status: DC | PRN

## 2022-10-03 MED ORDER — SEMAGLUTIDE (1 MG/DOSE) 4 MG/3ML ~~LOC~~ SOPN: 1.0000 mg | PEN_INJECTOR | SUBCUTANEOUS | 3 refills | Status: DC

## 2022-10-03 MED ORDER — BD PEN NEEDLE MINI U/F 31G X 5 MM MISC: 1.0000 | Freq: Four times a day (QID) | 3 refills | Status: DC

## 2022-10-03 MED ORDER — LEVOTHYROXINE SODIUM 100 MCG PO TABS: 100.0000 ug | ORAL_TABLET | Freq: Every day | ORAL | 3 refills | Status: DC

## 2022-10-03 MED ORDER — TOUJEO SOLOSTAR 300 UNIT/ML ~~LOC~~ SOPN: PEN_INJECTOR | SUBCUTANEOUS | 3 refills | Status: DC

## 2022-10-03 NOTE — Telephone Encounter (Signed)
Patient is positive for covid, please call in medication on [facility] on [address]

## 2022-10-03 NOTE — Telephone Encounter (Signed)
Call placed to Katherine Delgado,  No answer, left message to return the call.

## 2022-10-03 NOTE — Patient Instructions (Signed)
Please stop doxycycline  Start vitamin C 1000 mg daily x 7 days  Start vitamin D 2000 units daily x 7 days  Start Zinc 50 mg daily x 7 days  Eat bland food to help with nausea  Rest and hydrate   Please mask when going in public x 5 days

## 2022-10-03 NOTE — Patient Instructions (Addendum)
-   Continue  Ozempic 1 mg once weekly  - Continue  Toujeo 45  units every morning and 40 units every evening  - Continue Humalog 6 units with each meal  - Humalog correctional insulin: ADD extra units on insulin to your meal-time humalog dose if your blood sugars are higher than 170. Use the scale below to help guide you BEFORE each meal  Blood sugar before meal Number of units to inject  Less than 170 0 unit  171 - 200 1 unit  201 - 230 2 units   231 - 260 3 units   261 - 290 4 units   291 - 320 5 units   321 - 350 6 units          HOW TO TREAT LOW BLOOD SUGARS (Blood sugar LESS THAN 70 MG/DL) Please follow the RULE OF 15 for the treatment of hypoglycemia treatment (when your (blood sugars are less than 70 mg/dL)   STEP 1: Take 15 grams of carbohydrates when your blood sugar is low, which includes:  3-4 GLUCOSE TABS  OR 3-4 OZ OF JUICE OR REGULAR SODA OR ONE TUBE OF GLUCOSE GEL    STEP 2: RECHECK blood sugar in 15 MINUTES STEP 3: If your blood sugar is still low at the 15 minute recheck --> then, go back to STEP 1 and treat AGAIN with another 15 grams of carbohydrates.

## 2022-10-03 NOTE — Progress Notes (Signed)
Name: Katherine Delgado  Age/ Sex: 82 y.o., female   MRN/ DOB: [id number], April 09, 1941     PCP: Sandrea Hughs, NP   Reason for Endocrinology Evaluation: Type 2 Diabetes Mellitus/ hypothyriodism  Initial Endocrine Consultative Visit: 10/14/2020    PATIENT IDENTIFIER: Katherine Delgado is a 82 y.o. female with a past medical history of HTN, DM, RA, failed back syndrome and Hypothyroidism.. The patient has followed with Endocrinology clinic since 10/14/2020 for consultative assistance with management of her diabetes.      DIABETIC HISTORY:  Katherine Delgado was diagnosed with DM many years ago. Her hemoglobin A1c has ranged from Her A1c was ranged from 6.6% in 2019 to 10.3 % in 2022.     On her initial visit to our clinic her A1c was 10.3% .We continued MDI regimen , victoza and started [place] and was provided with a correction scale . A dexcom prescription was sent   [place] was discontinued due to severe genital irritation    Switch Victoza to Ozempic 02/2022 THYROID HISTORY: She has been on LT-4 replacement for years. Was noted to have a consistently low TSH since 05/2020. Per pt has been diagnosed with MNG in the past. No prior sx or biopsies. She was  On Levothyroxine 125 mcg daily which we reduced to 112 mcg daily   SUBJECTIVE:   During the last visit (03/02/2022): A1c 6.4 %   Today (10/03/2022): Katherine Delgado is here for a follow up on diabetes and hypothyroidism . She checks her blood sugars multiple times a day through CGM. The patient has had hypoglycemic episodes since the last clinic visit, this is noted during the day following a bolus , she is symptomatic.    Sees rheumatology  for RA Was seen by Ortho due to and nondisplaced fracture of the fifth metatarsal 03/2022   Denies nausea or diarrhea  Feels tired    HOME ENDOCRINE REGIMEN:  Toujeo 45 units QAM and 40 units QPM Humalog 6 units with each meal  Ozempic 1 mg weekly  CF: Humalog ( BG- 140/30)  Levothyroxine 100  mcg daily     CONTINUOUS GLUCOSE MONITORING RECORD INTERPRETATION    Dates of Recording: 1/30-2/07/2023  Sensor description:dexcom  Results statistics:   CGM use % of time 71  Average and SD 170/49  Time in range   65%  % Time Above 180 27  % Time above 250 7  % Time Below target < 1      Glycemic patterns summary:  Bg's mostly optimal at night and hyperglycemia noted postprandial   Hyperglycemic episodes  postprandial   Hypoglycemic episodes occurred sporadically following a bolus   Overnight periods: trends down    DIABETIC COMPLICATIONS: Microvascular complications:  neuropathy Denies: CKD, retinopathy Last Eye Exam: Completed 03/25/2021  Macrovascular complications:   Denies: CAD, CVA, PVD   HISTORY:  Past Medical History:  Past Medical History:  Diagnosis Date   Abnormality of gait 04/19/2016   Acute infective polyneuritis ([place]) 2002   Allergic rhinitis due to pollen    Chronic pain syndrome    COPD (chronic obstructive pulmonary disease) (HCC)    chronic bronchitis   Depressive disorder, not elsewhere classified    Diabetes mellitus without complication ([place])    Diaphragmatic hernia without mention of obstruction or gangrene    Dyslipidemia    Fibromyalgia    GERD (gastroesophageal reflux disease)    with h/o esophagitis   Guillain-Barre syndrome ([place])    History of benign  thymus tumor    Insomnia, unspecified    Lumbar spinal stenosis 03/30/2018   L4-5 level, severe   Miscarriage 1962   Mixed hyperlipidemia    Morbid obesity ([place])    Osteoporosis    Pneumonia    Polyneuropathy in diabetes(357.2)    Rheumatoid arthritis(714.0)    Spondylosis, lumbosacral    Spontaneous ecchymoses    Type II or unspecified type diabetes mellitus with peripheral circulatory disorders, uncontrolled(250.72)    Unspecified chronic bronchitis (HCC)    Unspecified essential hypertension    Unspecified hypothyroidism    Unspecified pruritic disorder     Unspecified urinary incontinence    Past Surgical History:  Past Surgical History:  Procedure Laterality Date   ABDOMINAL HYSTERECTOMY  1974   abdominal tumor  2002   APPENDECTOMY     APPLICATION OF A-CELL OF BACK N/A 09/26/2018   Procedure: With Acell;  Surgeon: Wallace Going, DO;  Location: [facility];  Service: Plastics;  Laterality: N/A;   APPLICATION OF WOUND VAC N/A 09/26/2018   Procedure: Vac placement;  Surgeon: Wallace Going, DO;  Location: [facility];  Service: Plastics;  Laterality: N/A;   BACK SURGERY  1982   BRONCHOSCOPY  2001   CATARACT EXTRACTION, BILATERAL  09/2019, 10/2019   CHOLECYSTECTOMY  1984   INCISION AND DRAINAGE OF WOUND N/A 09/26/2018   Procedure: Debridement of spine wound;  Surgeon: Wallace Going, DO;  Location: [facility];  Service: Plastics;  Laterality: N/A;   KNEE SURGERY Bilateral 08/27/2010 (L) and 01/11/2011 (R)   LUMBAR LAMINECTOMY/DECOMPRESSION MICRODISCECTOMY N/A 07/03/2018   Procedure: Lumbar Three to Lumbar Five Laminectomy;  Surgeon: Judith Part, MD;  Location: [facility];  Service: Neurosurgery;  Laterality: N/A;   LUMBAR WOUND DEBRIDEMENT N/A 08/20/2018   Procedure: POSTERIOR LUMBAR SPINAL WOUND DEBRIDEMENT AND REVISION;  Surgeon: Judith Part, MD;  Location: [facility];  Service: Neurosurgery;  Laterality: N/A;  POSTERIOR LUMBAR SPINAL WOUND REVISION   miscarrage  1962   OVARIAN CYST SURGERY  1968   thymus tumor     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   TONSILLECTOMY     Social History:  reports that she quit smoking about 42 years ago. Her smoking use included cigarettes. She has a 1.00 pack-year smoking history. She has been exposed to tobacco smoke. She has never used smokeless tobacco. She reports that she does not drink alcohol and does not use drugs. Family History:  Family History  Problem Relation Age of Onset   Alzheimer's disease Mother    Heart disease Mother    Heart disease Father    Liver disease Father    Cancer Brother     Arthritis Son    Colon polyps Brother    Colon cancer Neg Hx    Esophageal cancer Neg Hx    Kidney disease Neg Hx    Stomach cancer Neg Hx    Rectal cancer Neg Hx      HOME MEDICATIONS: Allergies as of 10/03/2022       Reactions   Influenza Vaccines Other (See Comments)   "h/o Guillain Barre; dr's told me years ago never to take another flu shot as it could relapse Katherine Delgado; has to do with when vaccine being changed to H1N1 virus"   Penicillins Hives, Itching, Swelling, Rash   Has patient had a PCN reaction causing immediate rash, facial/tongue/throat swelling, SOB or lightheadedness with hypotension:Unknown Has patient had a PCN reaction causing severe rash involving mucus membranes or skin necrosis:  Unknown Has patient had a PCN reaction that required hospitalization:Unknown Has patient had a PCN reaction occurring within the last 10 years: Unknown If all of the above answers are "NO", then may proceed with Cephalosporin use.   Statins Other (See Comments)   Myopathy, transaminitis   Sulfamethoxazole-trimethoprim Itching        Medication List        Accurate as of October 03, 2022  7:59 AM. If you have any questions, ask your nurse or doctor.          ALKA-SELTZER PLS ALLERGY & CGH PO Take by mouth.   amitriptyline 50 MG tablet Commonly known as: ELAVIL Take 1 tablet (50 mg total) by mouth at bedtime.   amLODipine 10 MG tablet Commonly known as: NORVASC TAKE 1 TABLET(10 MG) BY MOUTH DAILY   aspirin EC 81 MG tablet Take 81 mg by mouth 2 (two) times a week.   augmented betamethasone dipropionate 0.05 % cream Commonly known as: DIPROLENE-AF Apply topically 2 (two) times daily as needed.   B-D UF III MINI PEN NEEDLES 31G X 5 MM Misc Generic drug: Insulin Pen Needle USE AS DIRECTED   calamine lotion Apply 1 application topically 3 (three) times daily. To affected areas on right fore arm   cholecalciferol 25 MCG (1000 UNIT) tablet Commonly known as:  VITAMIN D3 Take 1,000 Units by mouth daily.   Cranberry 475 MG Caps Take 1 capsule (475 mg total) by mouth 2 (two) times daily.   docusate sodium 100 MG capsule Commonly known as: COLACE Take 100 mg by mouth daily as needed for mild constipation.   doxycycline 100 MG tablet Commonly known as: VIBRA-TABS Take 1 tablet (100 mg total) by mouth 2 (two) times daily.   DULoxetine 20 MG capsule Commonly known as: CYMBALTA TAKE 1 CAPSULE(20 MG) BY MOUTH DAILY   ezetimibe 10 MG tablet Commonly known as: ZETIA Take 1 tablet (10 mg total) by mouth daily.   fluticasone 0.05 % cream Commonly known as: CUTIVATE Apply topically 2 (two) times daily as needed.   fluticasone 50 MCG/ACT nasal spray Commonly known as: FLONASE SHAKE LIQUID AND USE 2 SPRAYS IN EACH NOSTRIL AT BEDTIME   folic acid 1 MG tablet Commonly known as: FOLVITE Take 2 tablets (2 mg total) by mouth daily.   furosemide 20 MG tablet Commonly known as: LASIX TAKE 1 BY MOUTH TWICE DAILY FOR 3 DAYS AS NEEDED FOR 3 POUND WEIGHT GAIN IN ONE DAY OR 5 POUNDS IN ONE WEEK   gabapentin 600 MG tablet Commonly known as: NEURONTIN TAKE 1 TABLET(600 MG) BY MOUTH THREE TIMES DAILY   HumaLOG KwikPen 200 UNIT/ML KwikPen Generic drug: insulin lispro INJECT UP TO 80 UNITS DAILY AS DIRECTED   ICY HOT MEDICATED SPRAY EX Apply 1 application topically as needed (shoulder pain).   ipratropium-albuterol 0.5-2.5 (3) MG/3ML Soln Commonly known as: DUONEB Take 3 mLs by nebulization every 6 (six) hours as needed.   ketoconazole 2 % cream Commonly known as: NIZORAL Apply 1 application topically 2 (two) times daily.   levothyroxine 100 MCG tablet Commonly known as: SYNTHROID Take 1 tablet (100 mcg total) by mouth daily.   lidocaine 5 % Commonly known as: LIDODERM PLACE 1 PATCH ONTO THE SKIN DAILY AS DIRECTED, REMOVE AND DISCARD PATCH WITHN 12 HOURS OR AS DIRECTED BY MD   lisinopril 20 MG tablet Commonly known as: ZESTRIL TAKE 1  TABLET BY MOUTH EVERY DAY   loperamide 2 MG tablet Commonly known as:  IMODIUM A-D Take 2 mg by mouth 4 (four) times daily as needed for diarrhea or loose stools.   Melatonin 10 MG Tabs Take 10 mg by mouth at bedtime as needed (sleep).   metoprolol tartrate 50 MG tablet Commonly known as: LOPRESSOR TAKE 1 TABLET BY MOUTH TWICE DAILY   MULTIVITAMIN ADULT PO Take 1 tablet by mouth daily.   Myrbetriq 50 MG Tb24 tablet Generic drug: mirabegron ER TAKE 1 TABLET(50 MG) BY MOUTH DAILY   nystatin powder Commonly known as: nystatin APPLY TOPICALLY THREE TIMES DAILY AS DIRECTED FOR 14 DAYS and prn yeast infection   nystatin cream Commonly known as: MYCOSTATIN Apply 1 application topically 3 (three) times daily. To skin folds   nystatin powder Commonly known as: MYCOSTATIN/NYSTOP Apply 1 application topically 3 (three) times daily.   omega-3 acid ethyl esters 1 g capsule Commonly known as: LOVAZA TAKE ONE CAPSULE BY MOUTH EVERY DAY   oxyCODONE-acetaminophen 10-325 MG tablet Commonly known as: Percocet Take 1 tablet by mouth every 4 (four) hours as needed for pain.   polyvinyl alcohol 1.4 % ophthalmic solution Commonly known as: LIQUIFILM TEARS Place 1 drop into both eyes 4 (four) times daily as needed (for dry/irritated eyes).   potassium chloride 10 MEQ CR capsule Commonly known as: MICRO-K TAKE 2 CAPSULES(20 MEQ) BY MOUTH TWICE DAILY WHEN AS NEEDED WHEN TAKING A DOSE OF LASIX   Semaglutide (1 MG/DOSE) 4 MG/3ML Sopn Inject 1 mg as directed once a week.   sodium chloride 0.65 % nasal spray Commonly known as: OCEAN Place 2 sprays into the nose as needed for congestion.   tiotropium 18 MCG inhalation capsule Commonly known as: SPIRIVA Place 18 mcg into inhaler and inhale as needed.   tiZANidine 2 MG tablet Commonly known as: ZANAFLEX TAKE 1 TABLET(2 MG) BY MOUTH THREE TIMES DAILY   Toujeo SoloStar 300 UNIT/ML Solostar Pen Generic drug: insulin glargine (1 Unit  Dial) 45 units QAM and 40 units  QPM   triamterene-hydrochlorothiazide 37.5-25 MG tablet Commonly known as: MAXZIDE-25 TAKE 1 TABLET BY MOUTH DAILY   venlafaxine XR 75 MG 24 hr capsule Commonly known as: EFFEXOR-XR TAKE 1 CAPSULE BY MOUTH EVERY DAY WITH BREAKFAST   Vitamin D (Ergocalciferol) 1.25 MG (50000 UNIT) Caps capsule Commonly known as: DRISDOL Take 1 capsule (50,000 Units total) by mouth every 7 (seven) days.   Xeljanz 5 MG Tabs Generic drug: Tofacitinib Citrate TAKE 1 TABLET BY MOUTH DAILY         OBJECTIVE:   Vital Signs: BP 122/70 (BP Location: Right Arm, Patient Position: Sitting, Cuff Size: Large)   Pulse 66   Ht 5' 6"$  (1.676 m)   Wt 222 lb (100.7 kg)   SpO2 94%   BMI 35.83 kg/m   Wt Readings from Last 3 Encounters:  10/03/22 222 lb (100.7 kg)  09/01/22 227 lb (103 kg)  08/31/22 231 lb 9.6 oz (105.1 kg)     Exam: General: Pt appears well and is in NAD  Lungs: Clear with good BS bilat   Heart: RRR   Abdomen: soft, nontender  Extremities: No pretibial edema.   Neuro: MS is good with appropriate affect, pt is alert and Ox3    DM foot exam:10/03/2022  The skin of the feet is without sores or ulcerations, toe nailed thickened, left pre-ulcerative callus at the left 1st MTH The pedal pulses are 1+ on right and 1+ on left. The sensation is decreased  to a screening 5.07, 10 gram monofilament on  the right     DATA REVIEWED:  Lab Results  Component Value Date   HGBA1C 6.7 (A) 10/03/2022   HGBA1C 6.4 (A) 03/02/2022   HGBA1C 5.9 (A) 09/02/2021    Latest Reference Range & Units 09/02/21 10:39  Sodium 135 - 145 mEq/L 140  Potassium 3.5 - 5.1 mEq/L 4.1  Chloride 96 - 112 mEq/L 103  CO2 19 - 32 mEq/L 28  Glucose 70 - 99 mg/dL 57 (L)  BUN 6 - 23 mg/dL 18  Creatinine 0.40 - 1.20 mg/dL 1.02  Calcium 8.4 - 10.5 mg/dL 9.1  GFR >60.00 mL/min 51.89 (L)    Latest Reference Range & Units 09/02/21 10:39  MICROALB/CREAT RATIO 0.0 - 30.0 mg/g 5.6      ASSESSMENT / PLAN / RECOMMENDATIONS:   1) Type 2 Diabetes Mellitus, With  Neuropathic complications - Most recent A1c of 6.7 %. Goal A1c < 7.5 %.    -A1c optimal - Intolerant to SGLT-2 inhibitors due to severe genital irritations  - No changes at this time - Pt tells me she feels     MEDICATIONS:  - Continue Ozempic 1 mg weekly  - Continue Toujeo 45 units QAM and 40 units  QPM - Continue Humalog 6 units with each meal  - CF : Humalog ( BG - 140/30 )    EDUCATION / INSTRUCTIONS: BG monitoring instructions: Patient is instructed to check her blood sugars 3 times a day, before meals . Call [facility] if: BG persistently < 70  I reviewed the Rule of 15 for the treatment of hypoglycemia in detail with the patient. Literature supplied.   2) Diabetic complications:  Eye: Does not have known diabetic retinopathy.  Neuro/ Feet: Does  have known diabetic peripheral neuropathy .  Renal: Patient does not have known baseline CKD.    3) Hypothyroidism:    -TSH is normal   Medication  levothyroxine 100 mcg daily  4) Left Foot Callus:  -Patient with a preulcerative callus at the left first metatarsal head -Patient advised to contact Dr. Mellody Drown office for follow-up   F/U in 6 months     Signed electronically by: Mack Guise, MD  [facility] Endocrinology  [facility] [address] [place], [place] [address] Phone: [phone] FAX: [phone]   CC: Sandrea Hughs, NP [place] [address] Phone: [phone]  Fax: [phone]  Return to Endocrinology clinic as below: Future Appointments  Date Time Provider [place]  10/03/2022 11:40 AM Bayard Hugger, NP [facility] CPR  02/27/2023 10:40 AM Ngetich, Nelda Bucks, NP [facility] None

## 2022-10-03 NOTE — Progress Notes (Deleted)
Subjective:    Patient ID: Katherine Delgado, female    DOB: [birth year], 82 y.o.   MRN: [id number]  HPI   Pain Inventory Average Pain {NUMBERS; 0-10:5044} Pain Right Now {NUMBERS; 0-10:5044} My pain is {PAIN DESCRIPTION:21022940}  In the last 24 hours, has pain interfered with the following? General activity {NUMBERS; 0-10:5044} Relation with others {NUMBERS; 0-10:5044} Enjoyment of life {NUMBERS; 0-10:5044} What TIME of day is your pain at its worst? {time of day:24191} Sleep (in general) {BHH GOOD/FAIR/POOR:22877}  Pain is worse with: {ACTIVITIES:21022942} Pain improves with: {PAIN IMPROVES BW:[id number] Relief from Meds: {NUMBERS; 0-10:5044}  Family History  Problem Relation Age of Onset   Alzheimer's disease Mother    Heart disease Mother    Heart disease Father    Liver disease Father    Cancer Brother    Arthritis Son    Colon polyps Brother    Colon cancer Neg Hx    Esophageal cancer Neg Hx    Kidney disease Neg Hx    Stomach cancer Neg Hx    Rectal cancer Neg Hx    Social History   Socioeconomic History   Marital status: Widowed    Spouse name: Not on file   Number of children: 2   Years of education: 14   Highest education level: Not on file  Occupational History   Occupation: Retired  Tobacco Use   Smoking status: Former    Packs/day: 0.10    Years: 10.00    Total pack years: 1.00    Types: Cigarettes    Quit date: 12/07/1979    Years since quitting: 42.8    Passive exposure: Current   Smokeless tobacco: Never  Vaping Use   Vaping Use: Never used  Substance and Sexual Activity   Alcohol use: No    Alcohol/week: 0.0 standard drinks of alcohol   Drug use: No   Sexual activity: Not Currently  Other Topics Concern   Not on file  Social History Narrative   Walks with cane   Right handed    Caffeine use: Coffee (2 cups every morning)   Tea: sometimes   Soda: none   Social Determinants of Health   Financial Resource Strain: Medium Risk  (10/05/2017)   Overall Financial Resource Strain (CARDIA)    Difficulty of Paying Living Expenses: Somewhat hard  Food Insecurity: Food Insecurity Present (10/05/2017)   Hunger Vital Sign    Worried About Running Out of Food in the Last Year: Sometimes true    Ran Out of Food in the Last Year: Sometimes true  Transportation Needs: No Transportation Needs (10/05/2017)   PRAPARE - Hydrologist (Medical): No    Lack of Transportation (Non-Medical): No  Physical Activity: Inactive (10/05/2017)   Exercise Vital Sign    Days of Exercise per Week: 0 days    Minutes of Exercise per Session: 0 min  Stress: Stress Concern Present (10/05/2017)   [place]    Feeling of Stress : Rather much  Social Connections: Moderately Isolated (10/05/2017)   Social Connection and Isolation Panel [NHANES]    Frequency of Communication with Friends and Family: More than three times a week    Frequency of Social Gatherings with Friends and Family: More than three times a week    Attends Religious Services: Never    Marine scientist or Organizations: No    Attends Archivist Meetings: Never  Marital Status: Widowed   Past Surgical History:  Procedure Laterality Date   ABDOMINAL HYSTERECTOMY  1974   abdominal tumor  2002   APPENDECTOMY     APPLICATION OF A-CELL OF BACK N/A 09/26/2018   Procedure: With Acell;  Surgeon: Wallace Going, DO;  Location: [facility];  Service: Plastics;  Laterality: N/A;   APPLICATION OF WOUND VAC N/A 09/26/2018   Procedure: Vac placement;  Surgeon: Wallace Going, DO;  Location: [facility];  Service: Plastics;  Laterality: N/A;   BACK SURGERY  1982   BRONCHOSCOPY  2001   CATARACT EXTRACTION, BILATERAL  09/2019, 10/2019   CHOLECYSTECTOMY  1984   INCISION AND DRAINAGE OF WOUND N/A 09/26/2018   Procedure: Debridement of spine wound;  Surgeon: Wallace Going, DO;  Location: [facility];  Service: Plastics;  Laterality: N/A;   KNEE SURGERY Bilateral 08/27/2010 (L) and 01/11/2011 (R)   LUMBAR LAMINECTOMY/DECOMPRESSION MICRODISCECTOMY N/A 07/03/2018   Procedure: Lumbar Three to Lumbar Five Laminectomy;  Surgeon: Judith Part, MD;  Location: [facility];  Service: Neurosurgery;  Laterality: N/A;   LUMBAR WOUND DEBRIDEMENT N/A 08/20/2018   Procedure: POSTERIOR LUMBAR SPINAL WOUND DEBRIDEMENT AND REVISION;  Surgeon: Judith Part, MD;  Location: [facility];  Service: Neurosurgery;  Laterality: N/A;  POSTERIOR LUMBAR SPINAL WOUND REVISION   miscarrage  1962   OVARIAN CYST SURGERY  1968   thymus tumor     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   TONSILLECTOMY     Past Surgical History:  Procedure Laterality Date   ABDOMINAL HYSTERECTOMY  1974   abdominal tumor  2002   APPENDECTOMY     APPLICATION OF A-CELL OF BACK N/A 09/26/2018   Procedure: With Acell;  Surgeon: Wallace Going, DO;  Location: [facility];  Service: Plastics;  Laterality: N/A;   APPLICATION OF WOUND VAC N/A 09/26/2018   Procedure: Vac placement;  Surgeon: Wallace Going, DO;  Location: [facility];  Service: Plastics;  Laterality: N/A;   BACK SURGERY  1982   BRONCHOSCOPY  2001   CATARACT EXTRACTION, BILATERAL  09/2019, 10/2019   CHOLECYSTECTOMY  1984   INCISION AND DRAINAGE OF WOUND N/A 09/26/2018   Procedure: Debridement of spine wound;  Surgeon: Wallace Going, DO;  Location: [facility];  Service: Plastics;  Laterality: N/A;   KNEE SURGERY Bilateral 08/27/2010 (L) and 01/11/2011 (R)   LUMBAR LAMINECTOMY/DECOMPRESSION MICRODISCECTOMY N/A 07/03/2018   Procedure: Lumbar Three to Lumbar Five Laminectomy;  Surgeon: Judith Part, MD;  Location: [facility];  Service: Neurosurgery;  Laterality: N/A;   LUMBAR WOUND DEBRIDEMENT N/A 08/20/2018   Procedure: POSTERIOR LUMBAR SPINAL WOUND DEBRIDEMENT AND REVISION;  Surgeon: Judith Part, MD;  Location: [facility] [place];  Service: Neurosurgery;  Laterality: N/A;  POSTERIOR LUMBAR SPINAL  WOUND REVISION   miscarrage  1962   OVARIAN CYST SURGERY  1968   thymus tumor     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   TONSILLECTOMY     Past Medical History:  Diagnosis Date   Abnormality of gait 04/19/2016   Acute infective polyneuritis ([place]) 2002   Allergic rhinitis due to pollen    Chronic pain syndrome    COPD (chronic obstructive pulmonary disease) (HCC)    chronic bronchitis   Depressive disorder, not elsewhere classified    Diabetes mellitus without complication ([place])    Diaphragmatic hernia without mention of obstruction or gangrene    Dyslipidemia    Fibromyalgia    GERD (gastroesophageal reflux disease)    with  h/o esophagitis   Guillain-Barre syndrome (HCC)    History of benign thymus tumor    Insomnia, unspecified    Lumbar spinal stenosis 03/30/2018   L4-5 level, severe   Miscarriage 1962   Mixed hyperlipidemia    Morbid obesity ([place])    Osteoporosis    Pneumonia    Polyneuropathy in diabetes(357.2)    Rheumatoid arthritis(714.0)    Spondylosis, lumbosacral    Spontaneous ecchymoses    Type II or unspecified type diabetes mellitus with peripheral circulatory disorders, uncontrolled(250.72)    Unspecified chronic bronchitis (HCC)    Unspecified essential hypertension    Unspecified hypothyroidism    Unspecified pruritic disorder    Unspecified urinary incontinence    There were no vitals taken for this visit.  Opioid Risk Score:   Fall Risk Score:  `1  Depression screen [place] [facility] 2/9     09/01/2022   10:53 AM 06/16/2022    9:46 AM 04/11/2022    8:22 AM 02/17/2022   11:22 AM 01/11/2022    9:09 AM 12/14/2021    9:15 AM 08/02/2021   10:15 AM  Depression screen [facility] 2/9  Decreased Interest 0 0 0 0 0 0 1  Down, Depressed, Hopeless 0 0 0 0 0 0 1  PHQ - 2 Score 0 0 0 0 0 0 2    Review of Systems     Objective:   Physical Exam        Assessment & Plan:

## 2022-10-03 NOTE — Progress Notes (Signed)
Careteam: Patient Care Team: Ngetich, Nelda Bucks, NP as PCP - General (Family Medicine) Bo Merino, MD (Rheumatology) Altheimer, Legrand Como, MD as Attending Physician (Endocrinology) Newt Minion, MD as Consulting Physician (Orthopedic Surgery)  Seen by: Windell Moulding, AGNP-C  PLACE OF SERVICE:  [place] Direct[facility] information    Allergies  Allergen Reactions   Influenza Vaccines Other (See Comments)    "h/o Rosalee Kaufman; dr's told me years ago never to take another flu shot as it could relapse Rosalee Kaufman; has to do with when vaccine being changed to H1N1 virus"   Penicillins Hives, Itching, Swelling and Rash    Has patient had a PCN reaction causing immediate rash, facial/tongue/throat swelling, SOB or lightheadedness with hypotension:Unknown Has patient had a PCN reaction causing severe rash involving mucus membranes or skin necrosis: Unknown Has patient had a PCN reaction that required hospitalization:Unknown Has patient had a PCN reaction occurring within the last 10 years: Unknown If all of the above answers are "NO", then may proceed with Cephalosporin use.    Statins Other (See Comments)    Myopathy, transaminitis   Sulfamethoxazole-Trimethoprim Itching    Chief Complaint  Patient presents with   Acute Visit    Cold symptoms     HPI: Patient is a 82 y.o. female seen today for acute visit due to cold symptoms.   02/07 she was diagnosed with sinusitis and acute cough and prescribed doxycycline, mucinex and flonase. Encounter was via telephone. No home testing for covid done. Since starting medication she has developed diarrhea and nausea. She has not been eating well. She has been eating crackers and tea since feeling sick. In office covid test positive. Afebrile. Vitals stable.    Review of Systems:  Review of Systems  Constitutional:  Negative for chills and fever.  HENT:  Positive for congestion. Negative for sore throat.   Eyes:  Negative  for blurred vision and double vision.  Respiratory:  Positive for cough. Negative for sputum production, shortness of breath and wheezing.   Cardiovascular:  Negative for chest pain and leg swelling.  Gastrointestinal:  Positive for diarrhea and nausea. Negative for abdominal pain, blood in stool, constipation and vomiting.  Genitourinary:  Negative for dysuria.  Musculoskeletal:  Negative for falls and myalgias.  Neurological:  Positive for weakness. Negative for dizziness and headaches.  Psychiatric/Behavioral:  Negative for depression. The patient is not nervous/anxious.     Past Medical History:  Diagnosis Date   Abnormality of gait 04/19/2016   Acute infective polyneuritis ([place]) 2002   Allergic rhinitis due to pollen    Chronic pain syndrome    COPD (chronic obstructive pulmonary disease) (HCC)    chronic bronchitis   Depressive disorder, not elsewhere classified    Diabetes mellitus without complication ([place])    Diaphragmatic hernia without mention of obstruction or gangrene    Dyslipidemia    Fibromyalgia    GERD (gastroesophageal reflux disease)    with h/o esophagitis   Guillain-Barre syndrome (HCC)    History of benign thymus tumor    Insomnia, unspecified    Lumbar spinal stenosis 03/30/2018   L4-5 level, severe   Miscarriage 1962   Mixed hyperlipidemia    Morbid obesity ([place])    Osteoporosis    Pneumonia    Polyneuropathy in diabetes(357.2)    Rheumatoid arthritis(714.0)    Spondylosis, lumbosacral    Spontaneous ecchymoses    Type II or unspecified type diabetes mellitus with peripheral circulatory disorders, uncontrolled(250.72)  Unspecified chronic bronchitis ([place])    Unspecified essential hypertension    Unspecified hypothyroidism    Unspecified pruritic disorder    Unspecified urinary incontinence    Past Surgical History:  Procedure Laterality Date   ABDOMINAL HYSTERECTOMY  1974   abdominal tumor  2002   APPENDECTOMY     APPLICATION OF A-CELL OF  BACK N/A 09/26/2018   Procedure: With Acell;  Surgeon: Wallace Going, DO;  Location: [facility];  Service: Plastics;  Laterality: N/A;   APPLICATION OF WOUND VAC N/A 09/26/2018   Procedure: Vac placement;  Surgeon: Wallace Going, DO;  Location: [place];  Service: Plastics;  Laterality: N/A;   BACK SURGERY  1982   BRONCHOSCOPY  2001   CATARACT EXTRACTION, BILATERAL  09/2019, 10/2019   CHOLECYSTECTOMY  1984   INCISION AND DRAINAGE OF WOUND N/A 09/26/2018   Procedure: Debridement of spine wound;  Surgeon: Wallace Going, DO;  Location: [facility];  Service: Plastics;  Laterality: N/A;   KNEE SURGERY Bilateral 08/27/2010 (L) and 01/11/2011 (R)   LUMBAR LAMINECTOMY/DECOMPRESSION MICRODISCECTOMY N/A 07/03/2018   Procedure: Lumbar Three to Lumbar Five Laminectomy;  Surgeon: Judith Part, MD;  Location: [facility];  Service: Neurosurgery;  Laterality: N/A;   LUMBAR WOUND DEBRIDEMENT N/A 08/20/2018   Procedure: POSTERIOR LUMBAR SPINAL WOUND DEBRIDEMENT AND REVISION;  Surgeon: Judith Part, MD;  Location: [facility];  Service: Neurosurgery;  Laterality: N/A;  POSTERIOR LUMBAR SPINAL WOUND REVISION   miscarrage  1962   OVARIAN CYST SURGERY  1968   thymus tumor     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   TONSILLECTOMY     Social History:   reports that she quit smoking about 42 years ago. Her smoking use included cigarettes. She has a 1.00 pack-year smoking history. She has been exposed to tobacco smoke. She has never used smokeless tobacco. She reports that she does not drink alcohol and does not use drugs.  Family History  Problem Relation Age of Onset   Alzheimer's disease Mother    Heart disease Mother    Heart disease Father    Liver disease Father    Cancer Brother    Arthritis Son    Colon polyps Brother    Colon cancer Neg Hx    Esophageal cancer Neg Hx    Kidney disease Neg Hx    Stomach cancer Neg Hx    Rectal cancer Neg Hx     Medications: Patient's Medications  New Prescriptions   No  medications on file  Previous Medications   AMITRIPTYLINE (ELAVIL) 50 MG TABLET    Take 1 tablet (50 mg total) by mouth at bedtime.   AMLODIPINE (NORVASC) 10 MG TABLET    TAKE 1 TABLET(10 MG) BY MOUTH DAILY   ASPIRIN EC 81 MG TABLET    Take 81 mg by mouth 2 (two) times a week.   AUGMENTED BETAMETHASONE DIPROPIONATE (DIPROLENE-AF) 0.05 % CREAM    Apply topically 2 (two) times daily as needed.   CALAMINE LOTION    Apply 1 application topically 3 (three) times daily. To affected areas on right fore arm   CHOLECALCIFEROL (VITAMIN D3) 25 MCG (1000 UT) TABLET    Take 1,000 Units by mouth daily.   CRANBERRY 475 MG CAPS    Take 1 capsule (475 mg total) by mouth 2 (two) times daily.   DOCUSATE SODIUM (COLACE) 100 MG CAPSULE    Take 100 mg by mouth daily as needed for mild constipation.   DOXYCYCLINE (  VIBRA-TABS) 100 MG TABLET    Take 1 tablet (100 mg total) by mouth 2 (two) times daily.   DULOXETINE (CYMBALTA) 20 MG CAPSULE    TAKE 1 CAPSULE(20 MG) BY MOUTH DAILY   EZETIMIBE (ZETIA) 10 MG TABLET    Take 1 tablet (10 mg total) by mouth daily.   FLUTICASONE (CUTIVATE) 0.05 % CREAM    Apply topically 2 (two) times daily as needed.   FLUTICASONE (FLONASE) 50 MCG/ACT NASAL SPRAY    SHAKE LIQUID AND USE 2 SPRAYS IN EACH NOSTRIL AT BEDTIME   FOLIC ACID (FOLVITE) 1 MG TABLET    Take 2 tablets (2 mg total) by mouth daily.   FUROSEMIDE (LASIX) 20 MG TABLET    TAKE 1 BY MOUTH TWICE DAILY FOR 3 DAYS AS NEEDED FOR 3 POUND WEIGHT GAIN IN ONE DAY OR 5 POUNDS IN ONE WEEK   GABAPENTIN (NEURONTIN) 600 MG TABLET    TAKE 1 TABLET(600 MG) BY MOUTH THREE TIMES DAILY   INSULIN GLARGINE, 1 UNIT DIAL, (TOUJEO SOLOSTAR) 300 UNIT/ML SOLOSTAR PEN    45 units QAM and 40 units  QPM   INSULIN LISPRO (HUMALOG KWIKPEN) 200 UNIT/ML KWIKPEN    INJECT UP TO 80 UNITS DAILY AS DIRECTED   INSULIN PEN NEEDLE (B-D UF III MINI PEN NEEDLES) 31G X 5 MM MISC    1 Device by Other route in the morning, at noon, in the evening, and at bedtime.    IPRATROPIUM-ALBUTEROL (DUONEB) 0.5-2.5 (3) MG/3ML SOLN    Take 3 mLs by nebulization every 6 (six) hours as needed.   KETOCONAZOLE (NIZORAL) 2 % CREAM    Apply 1 application topically 2 (two) times daily.   LEVOTHYROXINE (SYNTHROID) 100 MCG TABLET    Take 1 tablet (100 mcg total) by mouth daily.   LIDOCAINE (LIDODERM) 5 %    PLACE 1 PATCH ONTO THE SKIN DAILY AS DIRECTED, REMOVE AND DISCARD PATCH WITHN 12 HOURS OR AS DIRECTED BY MD   LISINOPRIL (ZESTRIL) 20 MG TABLET    TAKE 1 TABLET BY MOUTH EVERY DAY   LOPERAMIDE (IMODIUM A-D) 2 MG TABLET    Take 2 mg by mouth 4 (four) times daily as needed for diarrhea or loose stools.   MELATONIN 10 MG TABS    Take 10 mg by mouth at bedtime as needed (sleep).   MENTHOL, TOPICAL ANALGESIC, (ICY HOT MEDICATED SPRAY EX)    Apply 1 application topically as needed (shoulder pain).   METOPROLOL TARTRATE (LOPRESSOR) 50 MG TABLET    TAKE 1 TABLET BY MOUTH TWICE DAILY   MULTIPLE VITAMINS-MINERALS (MULTIVITAMIN ADULT PO)    Take 1 tablet by mouth daily.   MYRBETRIQ 50 MG TB24 TABLET    TAKE 1 TABLET(50 MG) BY MOUTH DAILY   NYSTATIN (MYCOSTATIN/NYSTOP) POWDER    Apply 1 application topically 3 (three) times daily.   NYSTATIN (NYSTATIN) POWDER    APPLY TOPICALLY THREE TIMES DAILY AS DIRECTED FOR 14 DAYS and prn yeast infection   NYSTATIN CREAM (MYCOSTATIN)    Apply 1 application topically 3 (three) times daily. To skin folds   OMEGA-3 ACID ETHYL ESTERS (LOVAZA) 1 G CAPSULE    TAKE ONE CAPSULE BY MOUTH EVERY DAY   OXYCODONE-ACETAMINOPHEN (PERCOCET) 10-325 MG TABLET    Take 1 tablet by mouth every 4 (four) hours as needed for pain.   PHENYLEPH-DOXYLAMINE-DM-APAP (ALKA-SELTZER PLS ALLERGY & CGH PO)    Take by mouth.   POLYVINYL ALCOHOL (LIQUIFILM TEARS) 1.4 % OPHTHALMIC SOLUTION  Place 1 drop into both eyes 4 (four) times daily as needed (for dry/irritated eyes).    POTASSIUM CHLORIDE (MICRO-K) 10 MEQ CR CAPSULE    TAKE 2 CAPSULES(20 MEQ) BY MOUTH TWICE DAILY WHEN AS NEEDED  WHEN TAKING A DOSE OF LASIX   SEMAGLUTIDE, 1 MG/DOSE, 4 MG/3ML SOPN    Inject 1 mg as directed once a week.   SODIUM CHLORIDE (OCEAN) 0.65 % NASAL SPRAY    Place 2 sprays into the nose as needed for congestion.   TIOTROPIUM (SPIRIVA) 18 MCG INHALATION CAPSULE    Place 18 mcg into inhaler and inhale as needed.   TIZANIDINE (ZANAFLEX) 2 MG TABLET    TAKE 1 TABLET(2 MG) BY MOUTH THREE TIMES DAILY   TRIAMTERENE-HYDROCHLOROTHIAZIDE (MAXZIDE-25) 37.5-25 MG TABLET    TAKE 1 TABLET BY MOUTH DAILY   VENLAFAXINE XR (EFFEXOR-XR) 75 MG 24 HR CAPSULE    TAKE 1 CAPSULE BY MOUTH EVERY DAY WITH BREAKFAST   VITAMIN D, ERGOCALCIFEROL, (DRISDOL) 1.25 MG (50000 UNIT) CAPS CAPSULE    Take 1 capsule (50,000 Units total) by mouth every 7 (seven) days.   XELJANZ 5 MG TABS    TAKE 1 TABLET BY MOUTH DAILY  Modified Medications   No medications on file  Discontinued Medications   No medications on file    Physical Exam:  Vitals:   10/03/22 0922  BP: 132/88  Pulse: 63  Resp: 18  Temp: (!) 97.3 F (36.3 C)  SpO2: 98%  Weight: 222 lb (100.7 kg)  Height: 5' 6"$  (1.676 m)   Body mass index is 35.83 kg/m. Wt Readings from Last 3 Encounters:  10/03/22 222 lb (100.7 kg)  10/03/22 222 lb (100.7 kg)  09/01/22 227 lb (103 kg)    Physical Exam Vitals reviewed.  Constitutional:      General: She is not in acute distress.    Appearance: She is obese.  HENT:     Head: Normocephalic.  Eyes:     General:        Right eye: No discharge.        Left eye: No discharge.  Cardiovascular:     Rate and Rhythm: Normal rate and regular rhythm.     Pulses: Normal pulses.     Heart sounds: Normal heart sounds.  Pulmonary:     Effort: Pulmonary effort is normal. No respiratory distress.     Breath sounds: Normal breath sounds. No wheezing.  Abdominal:     General: Bowel sounds are normal. There is no distension.     Palpations: Abdomen is soft.     Tenderness: There is no abdominal tenderness.  Musculoskeletal:      Cervical back: Neck supple.     Right lower leg: No edema.     Left lower leg: No edema.  Skin:    General: Skin is warm.     Capillary Refill: Capillary refill takes less than 2 seconds.  Neurological:     General: No focal deficit present.     Mental Status: She is alert and oriented to person, place, and time.  Psychiatric:        Mood and Affect: Mood normal.        Behavior: Behavior normal.     Labs reviewed: Basic Metabolic Panel: Recent Labs    03/02/22 1110 03/03/22 0850  NA  --  143  K  --  3.7  CL  --  102  CO2  --  29  GLUCOSE  --  41*  BUN  --  16  CREATININE  --  1.03*  CALCIUM  --  9.6  TSH 0.24*  --    Liver Function Tests: Recent Labs    03/03/22 0850  AST 17  ALT 10  BILITOT 0.4  PROT 6.9   No results for input(s): "LIPASE", "AMYLASE" in the last 8760 hours. No results for input(s): "AMMONIA" in the last 8760 hours. CBC: Recent Labs    03/03/22 0850  WBC 13.2*  NEUTROABS 7,828*  HGB 14.2  HCT 43.7  MCV 84.7  PLT 338   Lipid Panel: No results for input(s): "CHOL", "HDL", "LDLCALC", "TRIG", "CHOLHDL", "LDLDIRECT" in the last 8760 hours. TSH: Recent Labs    03/02/22 1110  TSH 0.24*   A1C: Lab Results  Component Value Date   HGBA1C 6.7 (A) 10/03/2022     Assessment/Plan: 1. Acute cough - 02/07 diagnosed with sinusitis > prescribed doxycycline and mucinex - nasal congestion and cough has resolved - covid 19 positive today - discontinue doxycycline - POC COVID-19  2. Nausea - abdomen soft, normal bowel sounds - ? related to taking doxycycline - POC COVID-19 - continue bland diet - ondansetron (ZOFRAN) 4 MG tablet; Take 1 tablet (4 mg total) by mouth every 8 (eight) hours as needed for nausea or vomiting.  Dispense: 20 tablet; Refill: 0  3. COVID-19 - see above - suspect she had covid 02/07 - out of time frame to start paxlovid - start vitamin C 1000 mg daily x 7 days - start vitamin D 2000 units daily x 7 days -  start Zinc 50 mg daily x 7 days - POC COVID-19  Total time: 25 minutes. Greater than 50% of total time spent doing patient education regarding cold symptoms, covid, and nausea including symptom/medication management.    Next appt: none Caasi Giglia [place], [facility] [phone]

## 2022-10-03 NOTE — Telephone Encounter (Signed)
Patient is going to get a covid test this morning because she is feeling sick and she is scheduled to see you at 11:40 this morning, I told her that you would probably want to change the visit type to either a video or phone visit since she isn't feeling well.

## 2022-10-04 ENCOUNTER — Encounter: Payer: Self-pay | Admitting: Internal Medicine

## 2022-10-17 NOTE — Telephone Encounter (Signed)
Handled by Dr Ranell Patrick-- rx was sent in in January with dnf before 10/03/22.

## 2022-10-20 ENCOUNTER — Other Ambulatory Visit: Payer: Self-pay | Admitting: Family

## 2022-10-20 DIAGNOSIS — N3281 Overactive bladder: Secondary | ICD-10-CM

## 2022-10-20 DIAGNOSIS — E1151 Type 2 diabetes mellitus with diabetic peripheral angiopathy without gangrene: Secondary | ICD-10-CM

## 2022-10-25 ENCOUNTER — Ambulatory Visit: Payer: Self-pay

## 2022-10-25 NOTE — Patient Outreach (Signed)
  Care Coordination   Initial Visit Note   10/25/2022 Name: MARSELLA SZOTT MRN: [id number] DOB: 1941/07/26  Doristine Section [place] is a 82 y.o. year old female who sees Ngetich, Nelda Bucks, NP for primary care. I spoke with  [name] by phone today.  What matters to the patients health and wellness today?  No concerns - doing well at this time    Goals Addressed             This Visit's Progress    COMPLETED: Care Coordination Activities       Care Coordination Interventions: Education the patient on the Care Coordination program- no follow up desired at this time Encouraged the patient to contact her primary care provider as needed         SDOH assessments and interventions completed:  Yes  SDOH Interventions Today    Flowsheet Row Most Recent Value  SDOH Interventions   Food Insecurity Interventions Intervention Not Indicated  Housing Interventions Intervention Not Indicated  Transportation Interventions Intervention Not Indicated        Care Coordination Interventions:  Yes, provided   Interventions Today    Flowsheet Row Most Recent Value  Chronic Disease   Chronic disease during today's visit Diabetes  General Interventions   General Interventions Discussed/Reviewed General Interventions Discussed  Education Interventions   Education Provided Provided Education  Provided Verbal Education On Other  [Care Coordination Program]        Follow up plan: No further intervention required.   Encounter Outcome:  Pt. Visit Completed   Daneen Schick, BSW, CDP Social Worker, Certified Dementia Practitioner [facility]  Care Coordination [phone]

## 2022-10-25 NOTE — Patient Instructions (Signed)
Visit Information  Thank you for taking time to visit with me today. Please don't hesitate to contact me if I can be of assistance to you.   Following are the goals we discussed today:   Goals Addressed             This Visit's Progress    COMPLETED: Care Coordination Activities       Care Coordination Interventions: Education the patient on the Care Coordination program- no follow up desired at this time Encouraged the patient to contact her primary care provider as needed        If you are experiencing a Mental Health or [place] or need someone to talk to, please call 911  Patient verbalizes understanding of instructions and care plan provided today and agrees to view in [place]. Active MyChart status and patient understanding of how to access instructions and care plan via MyChart confirmed with patient.     No further follow up required: Please contact your primary care provider as needed.  Daneen Schick, BSW, CDP Social Worker, Certified Dementia Practitioner [facility]  Care Coordination [phone]

## 2022-10-31 ENCOUNTER — Encounter: Payer: Medicare Other | Attending: Physical Medicine and Rehabilitation | Admitting: Registered Nurse

## 2022-10-31 ENCOUNTER — Encounter: Payer: Self-pay | Admitting: Registered Nurse

## 2022-10-31 VITALS — BP 189/99 | HR 62 | Ht 66.0 in | Wt 226.4 lb

## 2022-10-31 DIAGNOSIS — I10 Essential (primary) hypertension: Secondary | ICD-10-CM | POA: Insufficient documentation

## 2022-10-31 DIAGNOSIS — Z5181 Encounter for therapeutic drug level monitoring: Secondary | ICD-10-CM | POA: Diagnosis not present

## 2022-10-31 DIAGNOSIS — L4050 Arthropathic psoriasis, unspecified: Secondary | ICD-10-CM | POA: Diagnosis not present

## 2022-10-31 DIAGNOSIS — M961 Postlaminectomy syndrome, not elsewhere classified: Secondary | ICD-10-CM | POA: Diagnosis not present

## 2022-10-31 DIAGNOSIS — M170 Bilateral primary osteoarthritis of knee: Secondary | ICD-10-CM | POA: Diagnosis present

## 2022-10-31 DIAGNOSIS — G8929 Other chronic pain: Secondary | ICD-10-CM | POA: Insufficient documentation

## 2022-10-31 DIAGNOSIS — M542 Cervicalgia: Secondary | ICD-10-CM | POA: Insufficient documentation

## 2022-10-31 DIAGNOSIS — M5412 Radiculopathy, cervical region: Secondary | ICD-10-CM | POA: Diagnosis present

## 2022-10-31 DIAGNOSIS — M25511 Pain in right shoulder: Secondary | ICD-10-CM | POA: Diagnosis not present

## 2022-10-31 DIAGNOSIS — M7062 Trochanteric bursitis, left hip: Secondary | ICD-10-CM | POA: Insufficient documentation

## 2022-10-31 DIAGNOSIS — G894 Chronic pain syndrome: Secondary | ICD-10-CM | POA: Diagnosis not present

## 2022-10-31 DIAGNOSIS — M25512 Pain in left shoulder: Secondary | ICD-10-CM | POA: Diagnosis not present

## 2022-10-31 DIAGNOSIS — M545 Low back pain, unspecified: Secondary | ICD-10-CM | POA: Diagnosis present

## 2022-10-31 DIAGNOSIS — M7061 Trochanteric bursitis, right hip: Secondary | ICD-10-CM | POA: Diagnosis present

## 2022-10-31 DIAGNOSIS — Z79891 Long term (current) use of opiate analgesic: Secondary | ICD-10-CM | POA: Diagnosis not present

## 2022-10-31 MED ORDER — OXYCODONE-ACETAMINOPHEN 10-325 MG PO TABS: 1.0000 | ORAL_TABLET | ORAL | 0 refills | Status: DC | PRN

## 2022-10-31 NOTE — Progress Notes (Signed)
Subjective:    Patient ID: Katherine Delgado, female    DOB: [birth year], 82 y.o.   MRN: [id number]  HPI: Katherine Delgado is a 82 y.o. female who returns for follow up appointment for chronic pain and medication refill. She states her pain is located in her neck radiating into her bilateral shoulder pain, bilateral shoulder pain, lower back pain,bilateral hip pain and bilateral knee pain L>R. She rates her pain 10. Her current exercise regime is walking  in her home with walker or cane and performing stretching exercises.  Katherine Delgado arrived hypertensive, blood pressure was re-checked. She states she is compliant with her medication, she refuses Urgent Care evaluation. She will keep a blood pressure log and F/U with her PCP. She verbalizes understanding.   Katherine Delgado Morphine equivalent is 90.00 MME.   Oral Swab was Performed  Today.     Pain Inventory Average Pain 9 Pain Right Now 10 My pain is constant, sharp, burning, dull, stabbing, tingling, and aching  In the last 24 hours, has pain interfered with the following? General activity 10 Relation with others 10 Enjoyment of life 10 What TIME of day is your pain at its worst? morning  and night Sleep (in general) Poor  Pain is worse with: walking, bending, sitting, inactivity, standing, and some activites Pain improves with: medication Relief from Meds: 1  Family History  Problem Relation Age of Onset   Alzheimer's disease Mother    Heart disease Mother    Heart disease Father    Liver disease Father    Cancer Brother    Arthritis Son    Colon polyps Brother    Colon cancer Neg Hx    Esophageal cancer Neg Hx    Kidney disease Neg Hx    Stomach cancer Neg Hx    Rectal cancer Neg Hx    Social History   Socioeconomic History   Marital status: Widowed    Spouse name: Not on file   Number of children: 2   Years of education: 14   Highest education level: Not on file  Occupational History   Occupation: Retired  Tobacco Use    Smoking status: Former    Packs/day: 0.10    Years: 10.00    Total pack years: 1.00    Types: Cigarettes    Quit date: 12/07/1979    Years since quitting: 42.9    Passive exposure: Current   Smokeless tobacco: Never  Vaping Use   Vaping Use: Never used  Substance and Sexual Activity   Alcohol use: No    Alcohol/week: 0.0 standard drinks of alcohol   Drug use: No   Sexual activity: Not Currently  Other Topics Concern   Not on file  Social History Narrative   Walks with cane   Right handed    Caffeine use: Coffee (2 cups every morning)   Tea: sometimes   Soda: none   Social Determinants of Health   Financial Resource Strain: Medium Risk (10/05/2017)   Overall Financial Resource Strain (CARDIA)    Difficulty of Paying Living Expenses: Somewhat hard  Food Insecurity: No Food Insecurity (10/25/2022)   Hunger Vital Sign    Worried About Running Out of Food in the Last Year: Never true    Ran Out of Food in the Last Year: Never true  Transportation Needs: No Transportation Needs (10/25/2022)   PRAPARE - Hydrologist (Medical): No    Lack of Transportation (Non-Medical): No  Physical Activity: Inactive (10/05/2017)   Exercise Vital Sign    Days of Exercise per Week: 0 days    Minutes of Exercise per Session: 0 min  Stress: Stress Concern Present (10/05/2017)   [place]    Feeling of Stress : Rather much  Social Connections: Moderately Isolated (10/05/2017)   Social Connection and Isolation Panel [NHANES]    Frequency of Communication with Friends and Family: More than three times a week    Frequency of Social Gatherings with Friends and Family: More than three times a week    Attends Religious Services: Never    Marine scientist or Organizations: No    Attends Archivist Meetings: Never    Marital Status: Widowed   Past Surgical History:  Procedure Laterality Date    ABDOMINAL HYSTERECTOMY  1974   abdominal tumor  2002   APPENDECTOMY     APPLICATION OF A-CELL OF BACK N/A 09/26/2018   Procedure: With Acell;  Surgeon: Wallace Going, DO;  Location: [place];  Service: Plastics;  Laterality: N/A;   APPLICATION OF WOUND VAC N/A 09/26/2018   Procedure: Vac placement;  Surgeon: Wallace Going, DO;  Location: [place];  Service: Plastics;  Laterality: N/A;   BACK SURGERY  1982   BRONCHOSCOPY  2001   CATARACT EXTRACTION, BILATERAL  09/2019, 10/2019   CHOLECYSTECTOMY  1984   INCISION AND DRAINAGE OF WOUND N/A 09/26/2018   Procedure: Debridement of spine wound;  Surgeon: Wallace Going, DO;  Location: [place];  Service: Plastics;  Laterality: N/A;   KNEE SURGERY Bilateral 08/27/2010 (L) and 01/11/2011 (R)   LUMBAR LAMINECTOMY/DECOMPRESSION MICRODISCECTOMY N/A 07/03/2018   Procedure: Lumbar Three to Lumbar Five Laminectomy;  Surgeon: Judith Part, MD;  Location: [place];  Service: Neurosurgery;  Laterality: N/A;   LUMBAR WOUND DEBRIDEMENT N/A 08/20/2018   Procedure: POSTERIOR LUMBAR SPINAL WOUND DEBRIDEMENT AND REVISION;  Surgeon: Judith Part, MD;  Location: [place];  Service: Neurosurgery;  Laterality: N/A;  POSTERIOR LUMBAR SPINAL WOUND REVISION   miscarrage  1962   OVARIAN CYST SURGERY  1968   thymus tumor     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   TONSILLECTOMY     Past Surgical History:  Procedure Laterality Date   ABDOMINAL HYSTERECTOMY  1974   abdominal tumor  2002   APPENDECTOMY     APPLICATION OF A-CELL OF BACK N/A 09/26/2018   Procedure: With Acell;  Surgeon: Wallace Going, DO;  Location: [place];  Service: Plastics;  Laterality: N/A;   APPLICATION OF WOUND VAC N/A 09/26/2018   Procedure: Vac placement;  Surgeon: Wallace Going, DO;  Location: [place];  Service: Plastics;  Laterality: N/A;   BACK SURGERY  1982   BRONCHOSCOPY  2001   CATARACT EXTRACTION, BILATERAL  09/2019, 10/2019   CHOLECYSTECTOMY  1984   INCISION AND DRAINAGE OF WOUND  N/A 09/26/2018   Procedure: Debridement of spine wound;  Surgeon: Wallace Going, DO;  Location: [place];  Service: Plastics;  Laterality: N/A;   KNEE SURGERY Bilateral 08/27/2010 (L) and 01/11/2011 (R)   LUMBAR LAMINECTOMY/DECOMPRESSION MICRODISCECTOMY N/A 07/03/2018   Procedure: Lumbar Three to Lumbar Five Laminectomy;  Surgeon: Judith Part, MD;  Location: [place];  Service: Neurosurgery;  Laterality: N/A;   LUMBAR WOUND DEBRIDEMENT N/A 08/20/2018   Procedure: POSTERIOR LUMBAR SPINAL WOUND DEBRIDEMENT AND REVISION;  Surgeon: Judith Part, MD;  Location: [place];  Service: Neurosurgery;  Laterality: N/A;  POSTERIOR LUMBAR SPINAL WOUND REVISION   miscarrage  1962   OVARIAN CYST SURGERY  1968   thymus tumor     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   TONSILLECTOMY     Past Medical History:  Diagnosis Date   Abnormality of gait 04/19/2016   Acute infective polyneuritis ([place]) 2002   Allergic rhinitis due to pollen    Chronic pain syndrome    COPD (chronic obstructive pulmonary disease) (HCC)    chronic bronchitis   Depressive disorder, not elsewhere classified    Diabetes mellitus without complication ([place])    Diaphragmatic hernia without mention of obstruction or gangrene    Dyslipidemia    Fibromyalgia    GERD (gastroesophageal reflux disease)    with h/o esophagitis   Guillain-Barre syndrome (HCC)    History of benign thymus tumor    Insomnia, unspecified    Lumbar spinal stenosis 03/30/2018   L4-5 level, severe   Miscarriage 1962   Mixed hyperlipidemia    Morbid obesity ([place])    Osteoporosis    Pneumonia    Polyneuropathy in diabetes(357.2)    Rheumatoid arthritis(714.0)    Spondylosis, lumbosacral    Spontaneous ecchymoses    Type II or unspecified type diabetes mellitus with peripheral circulatory disorders, uncontrolled(250.72)    Unspecified chronic bronchitis (HCC)    Unspecified essential hypertension    Unspecified hypothyroidism    Unspecified pruritic disorder     Unspecified urinary incontinence    There were no vitals taken for this visit.  Opioid Risk Score:   Fall Risk Score:  `1  Depression screen [facility] 2/9     09/01/2022   10:53 AM 06/16/2022    9:46 AM 04/11/2022    8:22 AM 02/17/2022   11:22 AM 01/11/2022    9:09 AM 12/14/2021    9:15 AM 08/02/2021   10:15 AM  Depression screen PH[facility] 2/9  Decreased Interest 0 0 0 0 0 0 1  Down, Depressed, Hopeless 0 0 0 0 0 0 1  PHQ - 2 Score 0 0 0 0 0 0 2     Review of Systems  Constitutional: Negative.   HENT: Negative.    Eyes: Negative.   Respiratory: Negative.    Cardiovascular: Negative.   Gastrointestinal: Negative.   Endocrine: Negative.   Genitourinary: Negative.   Musculoskeletal:  Positive for arthralgias, back pain and myalgias.  Skin: Negative.   Allergic/Immunologic: Negative.   Neurological: Negative.   Hematological: Negative.   Psychiatric/Behavioral: Negative.    All other systems reviewed and are negative.      Objective:   Physical Exam Constitutional:      Appearance: Normal appearance.  Neck:     Comments: Cervical Paraspinal Tenderness: C-5-C-6 Cardiovascular:     Rate and Rhythm: Normal rate and regular rhythm.     Pulses: Normal pulses.     Heart sounds: Normal heart sounds.  Pulmonary:     Effort: Pulmonary effort is normal.     Breath sounds: Normal breath sounds.  Musculoskeletal:     Cervical back: Normal range of motion and neck supple.     Comments: Normal Muscle Bulk and Muscle Testing Reveals:  Upper Extremities: Full ROM and Muscle Strength 5/5 Bilateral AC Joint Tenderness  Thoracic and Lumbar Hypersensitivity Bilateral Greater Trochanter Tenderness Lower Extremities: Decreased ROM and Muscle Strength 5/5 Bilateral Lower Extremities Flexion Produces Pain into her Bilateral Patella's Arrived in wheelchair     Skin:    General: Skin is warm  and dry.  Neurological:     Mental Status: She is alert.         Assessment & Plan:  1.Failed  Back Syndrome: Continue HEP as Tolerated. Continue to Monitor. 10/31/2022 2.Lumbar Radiculitis: Continue Gabapentin. Continue to monitor. Continue HEP as Tolerated 10/31/2022 3.Bilateral  Greater Trochanter Bursitis: .Continue to Alternate Ice and Heat Therapy. Continue current medication regimen. Continue to monitor. 10/31/2022 4.Bilateral Primary OA:/ Both Knees  Continue HEP as Tolerated. Continue to Monitor. 10/31/2022 5.Insomnia: Continue Amitriptyline . Continue to Monitor.10/31/2022 6.Chronic Pain Syndrome: Refilled: Oxycodone 10/325 mg one tablet every 4 hours as needed for pain #180. We will continue the opioid monitoring program, this consists of regular clinic visits, examinations, urine drug screen, pill counts as well as use of [place] Controlled Substance Reporting system. A 12 month History has been reviewed on the [place] Controlled Substance Reporting System on 10/31/2022 7. Cervicalgia/ Cervical Radiculitis: Continue Gabapentin. Continue HEP as tolerated. Continue to monitor. 10/31/2022 8. Polyarthralgia: Continue HEP as tolerated. Continue to monitor. 10/31/2022 9. Polyneuropathy: Continue Gabapentin. Continue to Monitor. 10/31/2022 10. Uncontrolled Hypertension: blood pressure was re-checked. She states she is compliant with her medication, she refuses Urgent Care evaluation. She will keep a blood pressure log and F/U with her PCP. She verbalizes understanding.   F/U in 1 month

## 2022-11-03 ENCOUNTER — Telehealth: Payer: Self-pay | Admitting: *Deleted

## 2022-11-03 LAB — DRUG TOX MONITOR 1 W/CONF, ORAL FLD
Amphetamines: NEGATIVE ng/mL (ref <10)
Barbiturates: NEGATIVE ng/mL (ref <10)
Benzodiazepines: NEGATIVE ng/mL (ref <0.50)
Buprenorphine: NEGATIVE ng/mL (ref <0.10)
Cocaine: NEGATIVE ng/mL (ref <5.0)
Codeine: NEGATIVE ng/mL (ref <2.5)
Dihydrocodeine: NEGATIVE ng/mL (ref <2.5)
Fentanyl: NEGATIVE ng/mL (ref <0.10)
Heroin Metabolite: NEGATIVE ng/mL (ref <1.0)
Hydrocodone: NEGATIVE ng/mL (ref <2.5)
Hydromorphone: NEGATIVE ng/mL (ref <2.5)
MARIJUANA: NEGATIVE ng/mL (ref <2.5)
MDMA: NEGATIVE ng/mL (ref <10)
Meprobamate: NEGATIVE ng/mL (ref <2.5)
Methadone: NEGATIVE ng/mL (ref <5.0)
Morphine: NEGATIVE ng/mL (ref <2.5)
Nicotine Metabolite: NEGATIVE ng/mL (ref <5.0)
Norhydrocodone: NEGATIVE ng/mL (ref <2.5)
Noroxycodone: 86.3 ng/mL — ABNORMAL HIGH (ref <2.5)
Opiates: POSITIVE ng/mL — AB (ref <2.5)
Oxycodone: >250 ng/mL — ABNORMAL HIGH (ref <2.5)
Oxymorphone: NEGATIVE ng/mL (ref <2.5)
Phencyclidine: NEGATIVE ng/mL (ref <10)
Tapentadol: NEGATIVE ng/mL (ref <5.0)
Tramadol: NEGATIVE ng/mL (ref <5.0)
Zolpidem: NEGATIVE ng/mL (ref <5.0)

## 2022-11-03 LAB — DRUG TOX ALC METAB W/CON, ORAL FLD: Alcohol Metabolite: NEGATIVE ng/mL (ref <25)

## 2022-11-03 NOTE — Telephone Encounter (Signed)
Oral swab drug screen was consistent for prescribed medications.  ?

## 2022-11-28 ENCOUNTER — Encounter: Payer: Self-pay | Admitting: Registered Nurse

## 2022-11-28 ENCOUNTER — Encounter: Payer: Medicare Other | Attending: Physical Medicine and Rehabilitation | Admitting: Registered Nurse

## 2022-11-28 VITALS — BP 108/66 | HR 84 | Ht 66.0 in

## 2022-11-28 DIAGNOSIS — Z5181 Encounter for therapeutic drug level monitoring: Secondary | ICD-10-CM | POA: Insufficient documentation

## 2022-11-28 DIAGNOSIS — Z79891 Long term (current) use of opiate analgesic: Secondary | ICD-10-CM

## 2022-11-28 DIAGNOSIS — M5412 Radiculopathy, cervical region: Secondary | ICD-10-CM | POA: Diagnosis not present

## 2022-11-28 DIAGNOSIS — M170 Bilateral primary osteoarthritis of knee: Secondary | ICD-10-CM | POA: Diagnosis not present

## 2022-11-28 DIAGNOSIS — M7061 Trochanteric bursitis, right hip: Secondary | ICD-10-CM | POA: Insufficient documentation

## 2022-11-28 DIAGNOSIS — M25511 Pain in right shoulder: Secondary | ICD-10-CM | POA: Diagnosis not present

## 2022-11-28 DIAGNOSIS — M545 Low back pain, unspecified: Secondary | ICD-10-CM | POA: Insufficient documentation

## 2022-11-28 DIAGNOSIS — G894 Chronic pain syndrome: Secondary | ICD-10-CM

## 2022-11-28 DIAGNOSIS — M25512 Pain in left shoulder: Secondary | ICD-10-CM | POA: Diagnosis not present

## 2022-11-28 DIAGNOSIS — L4050 Arthropathic psoriasis, unspecified: Secondary | ICD-10-CM | POA: Insufficient documentation

## 2022-11-28 DIAGNOSIS — G8929 Other chronic pain: Secondary | ICD-10-CM | POA: Insufficient documentation

## 2022-11-28 DIAGNOSIS — M961 Postlaminectomy syndrome, not elsewhere classified: Secondary | ICD-10-CM | POA: Insufficient documentation

## 2022-11-28 DIAGNOSIS — M542 Cervicalgia: Secondary | ICD-10-CM | POA: Diagnosis not present

## 2022-11-28 DIAGNOSIS — M7062 Trochanteric bursitis, left hip: Secondary | ICD-10-CM

## 2022-11-28 MED ORDER — OXYCODONE-ACETAMINOPHEN 10-325 MG PO TABS: 1.0000 | ORAL_TABLET | ORAL | 0 refills | Status: DC | PRN

## 2022-11-28 NOTE — Progress Notes (Signed)
Subjective:    Patient ID: Katherine Delgado, female    DOB: 06-26-1941, 82 y.o.   MRN: [id number]  HPI: Katherine Delgado is a 82 y.o. female who returns for follow up appointment for chronic pain and medication refill. She states her pain is located in her neck radiating into her right shoulder and right arm with tingling and burning, also reports left shoulder pain R>L. She report left hand pain ( arthritis she states), lower back pain radiating into her bilateral hips ( R>L)and bilateral lower extremities, bilateral knees R>L and bilateral feet with tingling and burning. She rates her pain 8. Her current exercise regime is walking short distances in the home with walker otherwise in wheelchair.   Ms. Gammell Morphine equivalent is 90.00 MME.   Last Oral Swab was Performed on 10/31/2022, it was consistent.     Pain Inventory Average Pain 9 Pain Right Now 8 My pain is constant, sharp, burning, dull, stabbing, and tingling  In the last 24 hours, has pain interfered with the following? General activity 10 Relation with others 10 Enjoyment of life 10 What TIME of day is your pain at its worst? morning , evening, and night Sleep (in general) Poor  Pain is worse with: walking, bending, sitting, inactivity, standing, and some activites Pain improves with: rest and medication Relief from Meds: 4  Family History  Problem Relation Age of Onset   Alzheimer's disease Mother    Heart disease Mother    Heart disease Father    Liver disease Father    Cancer Brother    Arthritis Son    Colon polyps Brother    Colon cancer Neg Hx    Esophageal cancer Neg Hx    Kidney disease Neg Hx    Stomach cancer Neg Hx    Rectal cancer Neg Hx    Social History   Socioeconomic History   Marital status: Widowed    Spouse name: Not on file   Number of children: 2   Years of education: 14   Highest education level: Not on file  Occupational History   Occupation: Retired  Tobacco Use   Smoking status:  Former    Packs/day: 0.10    Years: 10.00    Additional pack years: 0.00    Total pack years: 1.00    Types: Cigarettes    Quit date: 12/07/1979    Years since quitting: 43.0    Passive exposure: Current   Smokeless tobacco: Never  Vaping Use   Vaping Use: Never used  Substance and Sexual Activity   Alcohol use: No    Alcohol/week: 0.0 standard drinks of alcohol   Drug use: No   Sexual activity: Not Currently  Other Topics Concern   Not on file  Social History Narrative   Walks with cane   Right handed    Caffeine use: Coffee (2 cups every morning)   Tea: sometimes   Soda: none   Social Determinants of Health   Financial Resource Strain: Medium Risk (10/05/2017)   Overall Financial Resource Strain (CARDIA)    Difficulty of Paying Living Expenses: Somewhat hard  Food Insecurity: No Food Insecurity (10/25/2022)   Hunger Vital Sign    Worried About Running Out of Food in the Last Year: Never true    Ran Out of Food in the Last Year: Never true  Transportation Needs: No Transportation Needs (10/25/2022)   PRAPARE - Administrator, Civil Service (Medical): No  Lack of Transportation (Non-Medical): No  Physical Activity: Inactive (10/05/2017)   Exercise Vital Sign    Days of Exercise per Week: 0 days    Minutes of Exercise per Session: 0 min  Stress: Stress Concern Present (10/05/2017)   [employer] of Occupational Health - Occupational Stress Questionnaire    Feeling of Stress : Rather much  Social Connections: Moderately Isolated (10/05/2017)   Social Connection and Isolation Panel [NHANES]    Frequency of Communication with Friends and Family: More than three times a week    Frequency of Social Gatherings with Friends and Family: More than three times a week    Attends Religious Services: Never    Database administrator or Organizations: No    Attends Banker Meetings: Never    Marital Status: Widowed   Past Surgical History:  Procedure  Laterality Date   ABDOMINAL HYSTERECTOMY  1974   abdominal tumor  2002   APPENDECTOMY     APPLICATION OF A-CELL OF BACK N/A 09/26/2018   Procedure: With Acell;  Surgeon: Peggye Form, DO;  [facility];  Service: Plastics;  Laterality: N/A;   APPLICATION OF WOUND VAC N/A 09/26/2018   Procedure: Vac placement;  Surgeon: Peggye Form, DO;  [facility];  Service: Plastics;  Laterality: N/A;   BACK SURGERY  1982   BRONCHOSCOPY  2001   CATARACT EXTRACTION, BILATERAL  09/2019, 10/2019   CHOLECYSTECTOMY  1984   INCISION AND DRAINAGE OF WOUND N/A 09/26/2018   Procedure: Debridement of spine wound;  Surgeon: Peggye Form, DO;  [facility];  Service: Plastics;  Laterality: N/A;   KNEE SURGERY Bilateral 08/27/2010 (L) and 01/11/2011 (R)   LUMBAR LAMINECTOMY/DECOMPRESSION MICRODISCECTOMY N/A 07/03/2018   Procedure: Lumbar Three to Lumbar Five Laminectomy;  Surgeon: Jadene Pierini, MD;  [facility];  Service: Neurosurgery;  Laterality: N/A;   LUMBAR WOUND DEBRIDEMENT N/A 08/20/2018   Procedure: POSTERIOR LUMBAR SPINAL WOUND DEBRIDEMENT AND REVISION;  Surgeon: Jadene Pierini, MD;  [facility];  Service: Neurosurgery;  Laterality: N/A;  POSTERIOR LUMBAR SPINAL WOUND REVISION   miscarrage  1962   OVARIAN CYST SURGERY  1968   thymus tumor     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   TONSILLECTOMY     Past Surgical History:  Procedure Laterality Date   ABDOMINAL HYSTERECTOMY  1974   abdominal tumor  2002   APPENDECTOMY     APPLICATION OF A-CELL OF BACK N/A 09/26/2018   Procedure: With Acell;  Surgeon: Peggye Form, DO;  [facility];  Service: Plastics;  Laterality: N/A;   APPLICATION OF WOUND VAC N/A 09/26/2018   Procedure: Vac placement;  Surgeon: Peggye Form, DO;  [facility];  Service: Plastics;  Laterality: N/A;   BACK SURGERY  1982   BRONCHOSCOPY  2001   CATARACT EXTRACTION, BILATERAL  09/2019, 10/2019   CHOLECYSTECTOMY  1984   INCISION AND  DRAINAGE OF WOUND N/A 09/26/2018   Procedure: Debridement of spine wound;  Surgeon: Peggye Form, DO;  [facility];  Service: Plastics;  Laterality: N/A;   KNEE SURGERY Bilateral 08/27/2010 (L) and 01/11/2011 (R)   LUMBAR LAMINECTOMY/DECOMPRESSION MICRODISCECTOMY N/A 07/03/2018   Procedure: Lumbar Three to Lumbar Five Laminectomy;  Surgeon: Jadene Pierini, MD;  [facility];  Service: Neurosurgery;  Laterality: N/A;   LUMBAR WOUND DEBRIDEMENT N/A 08/20/2018   Procedure: POSTERIOR LUMBAR SPINAL WOUND DEBRIDEMENT AND REVISION;  Surgeon: Jadene Pierini, MD;  [facility];  Service: Neurosurgery;  Laterality: N/A;  POSTERIOR LUMBAR SPINAL WOUND REVISION   miscarrage  1962   OVARIAN CYST SURGERY  1968   thymus tumor     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   TONSILLECTOMY     Past Medical History:  Diagnosis Date   Abnormality of gait 04/19/2016   Acute infective polyneuritis 2002   Allergic rhinitis due to pollen    Chronic pain syndrome    COPD (chronic obstructive pulmonary disease)    chronic bronchitis   Depressive disorder, not elsewhere classified    Diabetes mellitus without complication    Diaphragmatic hernia without mention of obstruction or gangrene    Dyslipidemia    Fibromyalgia    GERD (gastroesophageal reflux disease)    with h/o esophagitis   Guillain-Barre syndrome    History of benign thymus tumor    Insomnia, unspecified    Lumbar spinal stenosis 03/30/2018   L4-5 level, severe   Miscarriage 1962   Mixed hyperlipidemia    Morbid obesity    Osteoporosis    Pneumonia    Polyneuropathy in diabetes(357.2)    Rheumatoid arthritis(714.0)    Spondylosis, lumbosacral    Spontaneous ecchymoses    Type II or unspecified type diabetes mellitus with peripheral circulatory disorders, uncontrolled(250.72)    Unspecified chronic bronchitis    Unspecified essential hypertension    Unspecified hypothyroidism    Unspecified pruritic disorder    Unspecified urinary  incontinence    BP 108/66   Pulse 84   Ht 5\' 6"  (1.676 m)   SpO2 95%   BMI 36.54 kg/m   Opioid Risk Score:   Fall Risk Score:  `1  Depression screen [facility] 2/9     11/28/2022   11:21 AM 09/01/2022   10:53 AM 06/16/2022    9:46 AM 04/11/2022    8:22 AM 02/17/2022   11:22 AM 01/11/2022    9:09 AM 12/14/2021    9:15 AM  Depression screen PHQ 2/9  Decreased Interest 0 0 0 0 0 0 0  Down, Depressed, Hopeless 0 0 0 0 0 0 0  PHQ - 2 Score 0 0 0 0 0 0 0    Review of Systems  Musculoskeletal:  Positive for arthralgias, back pain, gait problem and neck pain.       Pain all over the body & joints  All other systems reviewed and are negative.      Objective:   Physical Exam Vitals and nursing note reviewed.  Constitutional:      Appearance: Normal appearance.  Cardiovascular:     Rate and Rhythm: Normal rate and regular rhythm.     Pulses: Normal pulses.     Heart sounds: Normal heart sounds.  Pulmonary:     Effort: Pulmonary effort is normal.     Breath sounds: Normal breath sounds.  Musculoskeletal:     Cervical back: Normal range of motion and neck supple.     Comments: Normal Muscle Bulk and Muscle Testing Reveals:  Upper Extremities: Right: Full ROM and Muscle Strength 5/5 Left Upper Extremity: Decreased ROM  90 Degrees and Muscle Strength 5/5  Bilateral AC Joint Tenderness Thoracic Paraspinal Tenderness: T-7-T-9 Lumbar Paraspinal Tenderness: L-3-L-5 Bilateral Greater Trochanter Tenderness Lower Extremities: Full ROM and Muscle Strength 5/5 Right Lower Extremity Flexion Produces Pain into her Right Hip and Right Patella Arrived in wheelchair      Skin:    General: Skin is warm and dry.  Neurological:  Mental Status: She is alert and oriented to person, place, and time.  Psychiatric:        Mood and Affect: Mood normal.        Behavior: Behavior normal.         Assessment & Plan:  1.Failed Back Syndrome: Continue HEP as Tolerated. Continue to Monitor.  11/28/2022 2.Lumbar Radiculitis: Continue Gabapentin. Continue to monitor. Continue HEP as Tolerated 11/28/2022 3.Bilateral  Greater Trochanter Bursitis: .Continue to Alternate Ice and Heat Therapy. Continue current medication regimen. Continue to monitor. 11/28/2022 4.Bilateral Primary OA:/ Both Knees  Continue HEP as Tolerated. Continue to Monitor. 11/28/2022 5.Insomnia: Continue Amitriptyline . Continue to Monitor.11/28/2022 6.Chronic Pain Syndrome: Refilled: Oxycodone 10/325 mg one tablet every 4 hours as needed for pain #180. We will continue the opioid monitoring program, this consists of regular clinic visits, examinations, urine drug screen, pill counts as well as use of [place] Controlled Substance Reporting system. A 12 month History has been reviewed on the [place] Controlled Substance Reporting System on 11/28/2022 7. Cervicalgia/ Cervical Radiculitis: Continue Gabapentin. Continue HEP as tolerated. Continue to monitor. 11/28/2022 8. Polyarthralgia: Continue HEP as tolerated. Continue to monitor. 11/28/2022 9. Polyneuropathy: Continue Gabapentin. Continue to Monitor. 11/28/2022  F/U in 1 month

## 2022-12-28 ENCOUNTER — Encounter: Payer: Medicare Other | Attending: Physical Medicine and Rehabilitation | Admitting: Registered Nurse

## 2022-12-28 VITALS — BP 133/66 | HR 71 | Ht 66.0 in | Wt 216.0 lb

## 2022-12-28 DIAGNOSIS — M961 Postlaminectomy syndrome, not elsewhere classified: Secondary | ICD-10-CM

## 2022-12-28 DIAGNOSIS — M7062 Trochanteric bursitis, left hip: Secondary | ICD-10-CM

## 2022-12-28 DIAGNOSIS — G8929 Other chronic pain: Secondary | ICD-10-CM

## 2022-12-28 DIAGNOSIS — G894 Chronic pain syndrome: Secondary | ICD-10-CM | POA: Diagnosis not present

## 2022-12-28 DIAGNOSIS — M542 Cervicalgia: Secondary | ICD-10-CM | POA: Diagnosis not present

## 2022-12-28 DIAGNOSIS — M2550 Pain in unspecified joint: Secondary | ICD-10-CM

## 2022-12-28 DIAGNOSIS — M170 Bilateral primary osteoarthritis of knee: Secondary | ICD-10-CM

## 2022-12-28 DIAGNOSIS — M7061 Trochanteric bursitis, right hip: Secondary | ICD-10-CM | POA: Diagnosis not present

## 2022-12-28 DIAGNOSIS — Z5181 Encounter for therapeutic drug level monitoring: Secondary | ICD-10-CM | POA: Diagnosis not present

## 2022-12-28 DIAGNOSIS — M25511 Pain in right shoulder: Secondary | ICD-10-CM | POA: Diagnosis not present

## 2022-12-28 DIAGNOSIS — M25512 Pain in left shoulder: Secondary | ICD-10-CM | POA: Diagnosis not present

## 2022-12-28 DIAGNOSIS — M5412 Radiculopathy, cervical region: Secondary | ICD-10-CM

## 2022-12-28 DIAGNOSIS — M545 Low back pain, unspecified: Secondary | ICD-10-CM

## 2022-12-28 MED ORDER — OXYCODONE-ACETAMINOPHEN 10-325 MG PO TABS: 1.0000 | ORAL_TABLET | ORAL | 0 refills | Status: DC | PRN

## 2022-12-28 NOTE — Progress Notes (Signed)
Subjective:    Patient ID: Katherine Delgado, female    DOB: 07-06-1941, 82 y.o.   MRN: [id number]  HPI: Katherine Delgado is a 82 y.o. female who returns for follow up appointment for chronic pain and medication refill. She states her pain is located in her neck radiating into her bilateral shoulders, lower back pain radiating into her bilateral hips and bilateral knee pain. She also reports generalized joint pain.  She rates her pain 9. Her current exercise regime is walking and performing stretching exercises.  Arrived with dressing on her left great toe, she states her son is removing the callous from her Left Great Toe, she also reports in the past she went to podiatrist for ulcer. She and her son was instructed to follow up with podiatry and for him not to remove any callous from her Great Toe, he verbalizes understanding. Dressing was removed, site cleansed, no drainage noted, She will call and schedule an appointment with Podiatry, she verbalizes understanding.   [name] Morphine equivalent is 90.00 MME.   Last Oral Swab was Performed on 10/31/2022, it was consistent.     Pain Inventory Average Pain 10 Pain Right Now 9 My pain is sharp, burning, dull, stabbing, tingling, and aching  In the last 24 hours, has pain interfered with the following? General activity 10 Relation with others 9 Enjoyment of life 10 What TIME of day is your pain at its worst? morning  and night Sleep (in general) Poor  Pain is worse with: walking, bending, sitting, standing, and some activites Pain improves with: medication Relief from Meds: 6  Family History  Problem Relation Age of Onset   Alzheimer's disease Mother    Heart disease Mother    Heart disease Father    Liver disease Father    Cancer Brother    Arthritis Son    Colon polyps Brother    Colon cancer Neg Hx    Esophageal cancer Neg Hx    Kidney disease Neg Hx    Stomach cancer Neg Hx    Rectal cancer Neg Hx    Social History    Socioeconomic History   Marital status: Widowed    Spouse name: Not on file   Number of children: 2   Years of education: 14   Highest education level: Not on file  Occupational History   Occupation: Retired  Tobacco Use   Smoking status: Former    Packs/day: 0.10    Years: 10.00    Additional pack years: 0.00    Total pack years: 1.00    Types: Cigarettes    Quit date: 12/07/1979    Years since quitting: 43.0    Passive exposure: Current   Smokeless tobacco: Never  Vaping Use   Vaping Use: Never used  Substance and Sexual Activity   Alcohol use: No    Alcohol/week: 0.0 standard drinks of alcohol   Drug use: No   Sexual activity: Not Currently  Other Topics Concern   Not on file  Social History Narrative   Walks with cane   Right handed    Caffeine use: Coffee (2 cups every morning)   Tea: sometimes   Soda: none   Social Determinants of Health   Financial Resource Strain: Medium Risk (10/05/2017)   Overall Financial Resource Strain (CARDIA)    Difficulty of Paying Living Expenses: Somewhat hard  Food Insecurity: No Food Insecurity (10/25/2022)   Hunger Vital Sign    Worried About Running Out  of Food in the Last Year: Never true    Ran Out of Food in the Last Year: Never true  Transportation Needs: No Transportation Needs (10/25/2022)   PRAPARE - Administrator, Civil Service (Medical): No    Lack of Transportation (Non-Medical): No  Physical Activity: Inactive (10/05/2017)   Exercise Vital Sign    Days of Exercise per Week: 0 days    Minutes of Exercise per Session: 0 min  Stress: Stress Concern Present (10/05/2017)   [employer] of Occupational Health - Occupational Stress Questionnaire    Feeling of Stress : Rather much  Social Connections: Moderately Isolated (10/05/2017)   Social Connection and Isolation Panel [NHANES]    Frequency of Communication with Friends and Family: More than three times a week    Frequency of Social Gatherings with  Friends and Family: More than three times a week    Attends Religious Services: Never    Database administrator or Organizations: No    Attends Banker Meetings: Never    Marital Status: Widowed   Past Surgical History:  Procedure Laterality Date   ABDOMINAL HYSTERECTOMY  1974   abdominal tumor  2002   APPENDECTOMY     APPLICATION OF A-CELL OF BACK N/A 09/26/2018   Procedure: With Acell;  Surgeon: Peggye Form, DO;  Location: [facility];  Service: Plastics;  Laterality: N/A;   APPLICATION OF WOUND VAC N/A 09/26/2018   Procedure: Vac placement;  Surgeon: Peggye Form, DO;  Location: [facility];  Service: Plastics;  Laterality: N/A;   BACK SURGERY  1982   BRONCHOSCOPY  2001   CATARACT EXTRACTION, BILATERAL  09/2019, 10/2019   CHOLECYSTECTOMY  1984   INCISION AND DRAINAGE OF WOUND N/A 09/26/2018   Procedure: Debridement of spine wound;  Surgeon: Peggye Form, DO;  Location: [facility];  Service: Plastics;  Laterality: N/A;   KNEE SURGERY Bilateral 08/27/2010 (L) and 01/11/2011 (R)   LUMBAR LAMINECTOMY/DECOMPRESSION MICRODISCECTOMY N/A 07/03/2018   Procedure: Lumbar Three to Lumbar Five Laminectomy;  Surgeon: Jadene Pierini, MD;  Location[facility];  Service: Neurosurgery;  Laterality: N/A;   LUMBAR WOUND DEBRIDEMENT N/A 08/20/2018   Procedure: POSTERIOR LUMBAR SPINAL WOUND DEBRIDEMENT AND REVISION;  Surgeon: Jadene Pierini, MD;  [facility]: [facility];  Service: Neurosurgery;  Laterality: N/A;  POSTERIOR LUMBAR SPINAL WOUND REVISION   miscarrage  1962   OVARIAN CYST SURGERY  1968   thymus tumor     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   TONSILLECTOMY     Past Surgical History:  Procedure Laterality Date   ABDOMINAL HYSTERECTOMY  1974   abdominal tumor  2002   APPENDECTOMY     APPLICATION OF A-CELL OF BACK N/A 09/26/2018   Procedure: With Acell;  Surgeon: Peggye Form, DO;  [facility];  Service: Plastics;  Laterality: N/A;   APPLICATION OF WOUND VAC N/A 09/26/2018    Procedure: Vac placement;  Surgeon: Peggye Form, DO;  [facility];  Service: Plastics;  Laterality: N/A;   BACK SURGERY  1982   BRONCHOSCOPY  2001   CATARACT EXTRACTION, BILATERAL  09/2019, 10/2019   CHOLECYSTECTOMY  1984   INCISION AND DRAINAGE OF WOUND N/A 09/26/2018   Procedure: Debridement of spine wound;  Surgeon: Peggye Form, DO;  [facility];  Service: Plastics;  Laterality: N/A;   KNEE SURGERY Bilateral 08/27/2010 (L) and 01/11/2011 (R)   LUMBAR LAMINECTOMY/DECOMPRESSION MICRODISCECTOMY N/A 07/03/2018   Procedure: Lumbar Three  to Lumbar Five Laminectomy;  Surgeon: Jadene Pierini, MD;  Location: [facility] OR;  Service: Neurosurgery;  Laterality: N/A;   LUMBAR WOUND DEBRIDEMENT N/A 08/20/2018   Procedure: POSTERIOR LUMBAR SPINAL WOUND DEBRIDEMENT AND REVISION;  Surgeon: Jadene Pierini, MD;  [facility];  Service: Neurosurgery;  Laterality: N/A;  POSTERIOR LUMBAR SPINAL WOUND REVISION   miscarrage  1962   OVARIAN CYST SURGERY  1968   thymus tumor     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   TONSILLECTOMY     Past Medical History:  Diagnosis Date   Abnormality of gait 04/19/2016   Acute infective polyneuritis (HCC) 2002   Allergic rhinitis due to pollen    Chronic pain syndrome    COPD (chronic obstructive pulmonary disease) (HCC)    chronic bronchitis   Depressive disorder, not elsewhere classified    Diabetes mellitus without complication (HCC)    Diaphragmatic hernia without mention of obstruction or gangrene    Dyslipidemia    Fibromyalgia    GERD (gastroesophageal reflux disease)    with h/o esophagitis   Guillain-Barre syndrome (HCC)    History of benign thymus tumor    Insomnia, unspecified    Lumbar spinal stenosis 03/30/2018   L4-5 level, severe   Miscarriage 1962   Mixed hyperlipidemia    Morbid obesity (HCC)    Osteoporosis    Pneumonia    Polyneuropathy in diabetes(357.2)    Rheumatoid arthritis(714.0)    Spondylosis, lumbosacral    Spontaneous  ecchymoses    Type II or unspecified type diabetes mellitus with peripheral circulatory disorders, uncontrolled(250.72)    Unspecified chronic bronchitis (HCC)    Unspecified essential hypertension    Unspecified hypothyroidism    Unspecified pruritic disorder    Unspecified urinary incontinence    Ht 5\' 6"  (1.676 m)   Wt 216 lb (98 kg)   BMI 34.86 kg/m   Opioid Risk Score:   Fall Risk Score:  `1  Depression screen [facility] 2/9     12/28/2022   10:52 AM 11/28/2022   11:21 AM 09/01/2022   10:53 AM 06/16/2022    9:46 AM 04/11/2022    8:22 AM 02/17/2022   11:22 AM 01/11/2022    9:09 AM  Depression screen PHQ 2/9  Decreased Interest 0 0 0 0 0 0 0  Down, Depressed, Hopeless 0 0 0 0 0 0 0  PHQ - 2 Score 0 0 0 0 0 0 0     Review of Systems  Musculoskeletal:  Positive for back pain, gait problem and neck pain.  All other systems reviewed and are negative.     Objective:   Physical Exam Vitals and nursing note reviewed.  Constitutional:      Appearance: Normal appearance.  Cardiovascular:     Rate and Rhythm: Normal rate and regular rhythm.     Pulses: Normal pulses.     Heart sounds: Normal heart sounds.  Pulmonary:     Effort: Pulmonary effort is normal.     Breath sounds: Normal breath sounds.  Musculoskeletal:     Cervical back: Normal range of motion and neck supple.     Comments: Normal Muscle Bulk and Muscle Testing Reveals:  Upper Extremities: Right: Decreased ROM  90 Degrees and Muscle Strength 5/5 Left Upper Extremity: Decreased ROM 45 Degrees and Muscle Strength 5/5 Left AC Joint Tenderness Lumbar Paraspinal Tenderness: L-3-L-5 Lower Extremities: Full ROM and Muscle Strength 5/5 Arrived in wheelchair      Skin:    General:  Skin is warm and dry.     Comments: Right Great Toe Ulcer: Dressing Changed: Site cleansed: No drainage noted.   Neurological:     Mental Status: She is alert and oriented to person, place, and time.  Psychiatric:        Mood and Affect: Mood  normal.        Behavior: Behavior normal.         Assessment & Plan:  1.Failed Back Syndrome: Continue HEP as Tolerated. Continue to Monitor. 12/28/2022 2.Lumbar Radiculitis: Continue Gabapentin. Continue to monitor. Continue HEP as Tolerated 12/28/2022 3.Bilateral  Greater Trochanter Bursitis: .Continue to Alternate Ice and Heat Therapy. Continue current medication regimen. Continue to monitor. 11/28/2022 4.Bilateral Primary OA:/ Both Knees  Continue HEP as Tolerated. Continue to Monitor. 12/28/2022 5.Insomnia: Continue Amitriptyline . Continue to Monitor.12/28/2022 6.Chronic Pain Syndrome: Refilled: Oxycodone 10/325 mg one tablet every 4 hours as needed for pain #180. We will continue the opioid monitoring program, this consists of regular clinic visits, examinations, urine drug screen, pill counts as well as use of [place] Controlled Substance Reporting system. A 12 month History has been reviewed on the [place] Controlled Substance Reporting System on 12/28/2022 7. Cervicalgia/ Cervical Radiculitis: Continue Gabapentin. Continue HEP as tolerated. Continue to monitor. 12/28/2022 8. Polyarthralgia: Continue HEP as tolerated. Continue to monitor. 12/28/2022 9. Polyneuropathy: Continue Gabapentin. Continue to Monitor. 12/28/2022   F/U in 1 month

## 2023-01-09 DIAGNOSIS — E1142 Type 2 diabetes mellitus with diabetic polyneuropathy: Secondary | ICD-10-CM | POA: Diagnosis not present

## 2023-01-23 ENCOUNTER — Telehealth: Payer: Self-pay | Admitting: Physical Medicine and Rehabilitation

## 2023-01-23 NOTE — Telephone Encounter (Signed)
Patient called to cancel her appointment , her car broke down and won't get a part until a few more days, patient is requesting medication refill on her oxycodone , if approved sent to [facility] on [address] [facility]

## 2023-01-24 ENCOUNTER — Encounter: Payer: Medicare Other | Attending: Physical Medicine and Rehabilitation | Admitting: Registered Nurse

## 2023-01-24 ENCOUNTER — Encounter: Payer: Medicare Other | Admitting: Physical Medicine and Rehabilitation

## 2023-01-24 ENCOUNTER — Encounter: Payer: Self-pay | Admitting: Registered Nurse

## 2023-01-24 VITALS — BP 147/66 | HR 77 | Ht 66.0 in | Wt 203.0 lb

## 2023-01-24 DIAGNOSIS — M545 Low back pain, unspecified: Secondary | ICD-10-CM | POA: Diagnosis not present

## 2023-01-24 DIAGNOSIS — M961 Postlaminectomy syndrome, not elsewhere classified: Secondary | ICD-10-CM | POA: Diagnosis not present

## 2023-01-24 DIAGNOSIS — M2550 Pain in unspecified joint: Secondary | ICD-10-CM | POA: Diagnosis not present

## 2023-01-24 DIAGNOSIS — M5412 Radiculopathy, cervical region: Secondary | ICD-10-CM | POA: Diagnosis not present

## 2023-01-24 DIAGNOSIS — M7061 Trochanteric bursitis, right hip: Secondary | ICD-10-CM | POA: Insufficient documentation

## 2023-01-24 DIAGNOSIS — M7062 Trochanteric bursitis, left hip: Secondary | ICD-10-CM | POA: Insufficient documentation

## 2023-01-24 DIAGNOSIS — M170 Bilateral primary osteoarthritis of knee: Secondary | ICD-10-CM | POA: Diagnosis not present

## 2023-01-24 DIAGNOSIS — M25512 Pain in left shoulder: Secondary | ICD-10-CM | POA: Insufficient documentation

## 2023-01-24 DIAGNOSIS — Z5181 Encounter for therapeutic drug level monitoring: Secondary | ICD-10-CM | POA: Diagnosis not present

## 2023-01-24 DIAGNOSIS — M542 Cervicalgia: Secondary | ICD-10-CM | POA: Diagnosis not present

## 2023-01-24 DIAGNOSIS — M25511 Pain in right shoulder: Secondary | ICD-10-CM | POA: Diagnosis not present

## 2023-01-24 DIAGNOSIS — G894 Chronic pain syndrome: Secondary | ICD-10-CM | POA: Diagnosis not present

## 2023-01-24 DIAGNOSIS — G8929 Other chronic pain: Secondary | ICD-10-CM | POA: Insufficient documentation

## 2023-01-24 MED ORDER — OXYCODONE-ACETAMINOPHEN 10-325 MG PO TABS: 1.0000 | ORAL_TABLET | ORAL | 0 refills | Status: DC | PRN

## 2023-01-24 NOTE — Progress Notes (Signed)
Subjective:    Patient ID: Katherine Delgado, female    DOB: [birth year], 82 y.o.   MRN: [id number]  HPI: Katherine Delgado is a 82 y.o. female who was scheduled for My- Chart visit today due to Transportation issues, her car is in the shop, was unable to access My- Chrt. Visit was changed to telephone Visit. : We have  discussed the limitations of evaluation and management by telemedicine and the availability of in person appointments. The patient expressed understanding and agreed to proceed.  I connected with [name] on 01/24/2023 by telephone and verified that I am speaking with the correct person using two identifiers.  Location: Patient: In her Home  Provider: In the Office    I discussed the limitations, risks, security and privacy concerns of performing an evaluation and management service by telephone and the availability of in person appointments. I also discussed with the patient that there may be a patient responsible charge related to this service. The patient expressed understanding and agreed to proceed.  She states her  pain is located in her neck radiating into her bilateral shoulders, lower back pain, bilateral hips and generalized joint pain. She rates her pain 8. Her current exercise regime is walking with walker in her home for short distances to her bathroom, other wise she is wheelchair bound she reports.   Katherine Delgado Morphine equivalent is 90.00 MME.   Last Oral Swab was Performed on 10/31/2022, it was consistent.      Pain Inventory Average Pain 8 Pain Right Now 8 My pain is intermittent, constant, sharp, burning, dull, stabbing, tingling, and aching  In the last 24 hours, has pain interfered with the following? General activity 10 Relation with others 10 Enjoyment of life 10 What TIME of day is your pain at its worst? morning  and night Sleep (in general) Poor  Pain is worse with: walking, bending, sitting, inactivity, standing, and some activites Pain  improves with: rest, heat/ice, and medication Relief from Meds: 5  Family History  Problem Relation Age of Onset   Alzheimer's disease Mother    Heart disease Mother    Heart disease Father    Liver disease Father    Cancer Brother    Arthritis Son    Colon polyps Brother    Colon cancer Neg Hx    Esophageal cancer Neg Hx    Kidney disease Neg Hx    Stomach cancer Neg Hx    Rectal cancer Neg Hx    Social History   Socioeconomic History   Marital status: Widowed    Spouse name: Not on file   Number of children: 2   Years of education: 14   Highest education level: Not on file  Occupational History   Occupation: Retired  Tobacco Use   Smoking status: Former    Packs/day: 0.10    Years: 10.00    Additional pack years: 0.00    Total pack years: 1.00    Types: Cigarettes    Quit date: 12/07/1979    Years since quitting: 43.1    Passive exposure: Current   Smokeless tobacco: Never  Vaping Use   Vaping Use: Never used  Substance and Sexual Activity   Alcohol use: No    Alcohol/week: 0.0 standard drinks of alcohol   Drug use: No   Sexual activity: Not Currently  Other Topics Concern   Not on file  Social History Narrative   Walks with cane   Right  handed    Caffeine use: Coffee (2 cups every morning)   Tea: sometimes   Soda: none   Social Determinants of Health   Financial Resource Strain: Medium Risk (10/05/2017)   Overall Financial Resource Strain (CARDIA)    Difficulty of Paying Living Expenses: Somewhat hard  Food Insecurity: No Food Insecurity (10/25/2022)   Hunger Vital Sign    Worried About Running Out of Food in the Last Year: Never true    Ran Out of Food in the Last Year: Never true  Transportation Needs: No Transportation Needs (10/25/2022)   PRAPARE - Administrator, Civil Service (Medical): No    Lack of Transportation (Non-Medical): No  Physical Activity: Inactive (10/05/2017)   Exercise Vital Sign    Days of Exercise per Week: 0 days     Minutes of Exercise per Session: 0 min  Stress: Stress Concern Present (10/05/2017)   [employer] of Occupational Health - Occupational Stress Questionnaire    Feeling of Stress : Rather much  Social Connections: Moderately Isolated (10/05/2017)   Social Connection and Isolation Panel [NHANES]    Frequency of Communication with Friends and Family: More than three times a week    Frequency of Social Gatherings with Friends and Family: More than three times a week    Attends Religious Services: Never    Database administrator or Organizations: No    Attends Banker Meetings: Never    Marital Status: Widowed   Past Surgical History:  Procedure Laterality Date   ABDOMINAL HYSTERECTOMY  1974   abdominal tumor  2002   APPENDECTOMY     APPLICATION OF A-CELL OF BACK N/A 09/26/2018   Procedure: With Acell;  Surgeon: Peggye Form, DO;  Location: [facility];  Service: Plastics;  Laterality: N/A;   APPLICATION OF WOUND VAC N/A 09/26/2018   Procedure: Vac placement;  Surgeon: Peggye Form, DO;  Location: [facility];  Service: Plastics;  Laterality: N/A;   BACK SURGERY  1982   BRONCHOSCOPY  2001   CATARACT EXTRACTION, BILATERAL  09/2019, 10/2019   CHOLECYSTECTOMY  1984   INCISION AND DRAINAGE OF WOUND N/A 09/26/2018   Procedure: Debridement of spine wound;  Surgeon: Peggye Form, DO;  Location: [facility];  Service: Plastics;  Laterality: N/A;   KNEE SURGERY Bilateral 08/27/2010 (L) and 01/11/2011 (R)   LUMBAR LAMINECTOMY/DECOMPRESSION MICRODISCECTOMY N/A 07/03/2018   Procedure: Lumbar Three to Lumbar Five Laminectomy;  Surgeon: Jadene Pierini, MD;  [facility]: [facility];  Service: Neurosurgery;  Laterality: N/A;   LUMBAR WOUND DEBRIDEMENT N/A 08/20/2018   Procedure: POSTERIOR LUMBAR SPINAL WOUND DEBRIDEMENT AND REVISION;  Surgeon: Jadene Pierini, MD;  [facility];  Service: Neurosurgery;  Laterality: N/A;  POSTERIOR LUMBAR SPINAL WOUND REVISION   miscarrage  1962    OVARIAN CYST SURGERY  1968   thymus tumor     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   TONSILLECTOMY     Past Surgical History:  Procedure Laterality Date   ABDOMINAL HYSTERECTOMY  1974   abdominal tumor  2002   APPENDECTOMY     APPLICATION OF A-CELL OF BACK N/A 09/26/2018   Procedure: With Acell;  Surgeon: Peggye Form, DO;  [facility]: [facility];  Service: Plastics;  Laterality: N/A;   APPLICATION OF WOUND VAC N/A 09/26/2018   Procedure: Vac placement;  Surgeon: Peggye Form, DO;  [facility];  Service: Plastics;  Laterality: N/A;   BACK SURGERY  1982   BRONCHOSCOPY  2001   CATARACT EXTRACTION, BILATERAL  09/2019, 10/2019   CHOLECYSTECTOMY  1984   INCISION AND DRAINAGE OF WOUND N/A 09/26/2018   Procedure: Debridement of spine wound;  Surgeon: Peggye Form, DO;  [facility]: [facility];  Service: Plastics;  Laterality: N/A;   KNEE SURGERY Bilateral 08/27/2010 (L) and 01/11/2011 (R)   LUMBAR LAMINECTOMY/DECOMPRESSION MICRODISCECTOMY N/A 07/03/2018   Procedure: Lumbar Three to Lumbar Five Laminectomy;  Surgeon: Jadene Pierini, MD;  [facility];  Service: Neurosurgery;  Laterality: N/A;   LUMBAR WOUND DEBRIDEMENT N/A 08/20/2018   Procedure: POSTERIOR LUMBAR SPINAL WOUND DEBRIDEMENT AND REVISION;  Surgeon: Jadene Pierini, MD;  [facility];  Service: Neurosurgery;  Laterality: N/A;  POSTERIOR LUMBAR SPINAL WOUND REVISION   miscarrage  1962   OVARIAN CYST SURGERY  1968   thymus tumor     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   TONSILLECTOMY     Past Medical History:  Diagnosis Date   Abnormality of gait 04/19/2016   Acute infective polyneuritis (HCC) 2002   Allergic rhinitis due to pollen    Chronic pain syndrome    COPD (chronic obstructive pulmonary disease) (HCC)    chronic bronchitis   Depressive disorder, not elsewhere classified    Diabetes mellitus without complication (HCC)    Diaphragmatic hernia without mention of obstruction or gangrene    Dyslipidemia    Fibromyalgia    GERD  (gastroesophageal reflux disease)    with h/o esophagitis   Guillain-Barre syndrome (HCC)    History of benign thymus tumor    Insomnia, unspecified    Lumbar spinal stenosis 03/30/2018   L4-5 level, severe   Miscarriage 1962   Mixed hyperlipidemia    Morbid obesity (HCC)    Osteoporosis    Pneumonia    Polyneuropathy in diabetes(357.2)    Rheumatoid arthritis(714.0)    Spondylosis, lumbosacral    Spontaneous ecchymoses    Type II or unspecified type diabetes mellitus with peripheral circulatory disorders, uncontrolled(250.72)    Unspecified chronic bronchitis (HCC)    Unspecified essential hypertension    Unspecified hypothyroidism    Unspecified pruritic disorder    Unspecified urinary incontinence    BP (!) 147/66 Comment: Today visit is a video visit- vitals reported by patient  Pulse 77   Ht 5\' 6"  (1.676 m)   Wt 203 lb (92.1 kg)   BMI 32.77 kg/m   Opioid Risk Score:   Fall Risk Score:  `1  Depression screen [facility] 2/9     01/24/2023   12:54 PM 12/28/2022   10:52 AM 11/28/2022   11:21 AM 09/01/2022   10:53 AM 06/16/2022    9:46 AM 04/11/2022    8:22 AM 02/17/2022   11:22 AM  Depression screen PHQ 2/9  Decreased Interest 0 0 0 0 0 0 0  Down, Depressed, Hopeless 0 0 0 0 0 0 0  PHQ - 2 Score 0 0 0 0 0 0 0    Review of Systems  Musculoskeletal:  Positive for back pain and gait problem.       Pain in both hips, knees, arms & shoulders  All other systems reviewed and are negative.     Objective:   Physical Exam Vitals and nursing note reviewed.  Musculoskeletal:     Comments: No Physical Assessment Performed: Telephone Visit           Assessment & Plan:  1.Failed Back Syndrome: Continue HEP as Tolerated. Continue to Monitor. 01/24/2023 2.Lumbar Radiculitis/ Chronic Low Back  Pain without Sciatica: Continue Gabapentin. Continue to monitor. Continue HEP as Tolerated 01/24/2023 3.Bilateral  Greater Trochanter Bursitis: .Continue to Alternate Ice and Heat Therapy.  Continue current medication regimen. Continue to monitor. 01/24/2023 4.Bilateral Primary OA:/ Both Knees  Continue HEP as Tolerated. Continue to Monitor. 01/24/2023 5.Insomnia: Continue Amitriptyline . Continue to Monitor.01/24/2023 6.Chronic Pain Syndrome: Refilled: Oxycodone 10/325 mg one tablet every 4 hours as needed for pain #180. We will continue the opioid monitoring program, this consists of regular clinic visits, examinations, urine drug screen, pill counts as well as use of [place] Controlled Substance Reporting system. A 12 month History has been reviewed on the [place] Controlled Substance Reporting System on 01/24/2023 7. Cervicalgia/ Cervical Radiculitis: Continue Gabapentin. Continue HEP as tolerated. Continue to monitor. 01/24/2023 8. Polyarthralgia: Continue HEP as tolerated. Continue to monitor. 01/24/2023 9. Polyneuropathy: Continue Gabapentin. Continue to Monitor. 01/24/2023   F/U in 1 month     Established Patient  Location of Patient: In Her Home  Location of Provider: In the Office   I provided 10 minutes of non-face-to-face time during this encounter.

## 2023-02-15 ENCOUNTER — Other Ambulatory Visit: Payer: Self-pay | Admitting: Family

## 2023-02-24 ENCOUNTER — Encounter: Payer: Medicare Other | Attending: Physical Medicine and Rehabilitation | Admitting: Registered Nurse

## 2023-02-24 ENCOUNTER — Encounter: Payer: Self-pay | Admitting: Registered Nurse

## 2023-02-24 VITALS — BP 135/75 | HR 64 | Ht 66.0 in | Wt 214.4 lb

## 2023-02-24 DIAGNOSIS — M2550 Pain in unspecified joint: Secondary | ICD-10-CM | POA: Insufficient documentation

## 2023-02-24 DIAGNOSIS — M7062 Trochanteric bursitis, left hip: Secondary | ICD-10-CM | POA: Diagnosis not present

## 2023-02-24 DIAGNOSIS — M170 Bilateral primary osteoarthritis of knee: Secondary | ICD-10-CM | POA: Diagnosis not present

## 2023-02-24 DIAGNOSIS — M25511 Pain in right shoulder: Secondary | ICD-10-CM | POA: Insufficient documentation

## 2023-02-24 DIAGNOSIS — M5412 Radiculopathy, cervical region: Secondary | ICD-10-CM | POA: Insufficient documentation

## 2023-02-24 DIAGNOSIS — M961 Postlaminectomy syndrome, not elsewhere classified: Secondary | ICD-10-CM | POA: Diagnosis not present

## 2023-02-24 DIAGNOSIS — Z5181 Encounter for therapeutic drug level monitoring: Secondary | ICD-10-CM | POA: Insufficient documentation

## 2023-02-24 DIAGNOSIS — M545 Low back pain, unspecified: Secondary | ICD-10-CM | POA: Insufficient documentation

## 2023-02-24 DIAGNOSIS — M25512 Pain in left shoulder: Secondary | ICD-10-CM | POA: Diagnosis not present

## 2023-02-24 DIAGNOSIS — G8929 Other chronic pain: Secondary | ICD-10-CM | POA: Diagnosis not present

## 2023-02-24 DIAGNOSIS — M542 Cervicalgia: Secondary | ICD-10-CM | POA: Diagnosis not present

## 2023-02-24 DIAGNOSIS — M7061 Trochanteric bursitis, right hip: Secondary | ICD-10-CM | POA: Insufficient documentation

## 2023-02-24 DIAGNOSIS — G894 Chronic pain syndrome: Secondary | ICD-10-CM | POA: Diagnosis not present

## 2023-02-24 MED ORDER — OXYCODONE-ACETAMINOPHEN 10-325 MG PO TABS: 1.0000 | ORAL_TABLET | ORAL | 0 refills | Status: DC | PRN

## 2023-02-24 NOTE — Progress Notes (Unsigned)
Subjective:    Patient ID: Katherine Delgado, female    DOB: Mar 22, 1941, 82 y.o.   MRN: [id number]  HPI: Katherine Delgado is a 82 y.o. female who returns for follow up appointment for chronic pain and medication refill. states *** pain is located in  ***. rates pain ***. current exercise regime is walking and performing stretching exercises.  [name] Morphine equivalent is *** MME.   Last Oral Swab was Performed on 10/31/2022, it was consistent.     Pain Inventory Average Pain 8 Pain Right Now 9 My pain is constant, sharp, burning, stabbing, and aching  In the last 24 hours, has pain interfered with the following? General activity 10 Relation with others 10 Enjoyment of life 10 What TIME of day is your pain at its worst? morning  and night Sleep (in general) Poor  Pain is worse with: walking, bending, sitting, standing, and some activites Pain improves with: rest and medication Relief from Meds: 4  Family History  Problem Relation Age of Onset   Alzheimer's disease Mother    Heart disease Mother    Heart disease Father    Liver disease Father    Cancer Brother    Arthritis Son    Colon polyps Brother    Colon cancer Neg Hx    Esophageal cancer Neg Hx    Kidney disease Neg Hx    Stomach cancer Neg Hx    Rectal cancer Neg Hx    Social History   Socioeconomic History   Marital status: Widowed    Spouse name: Not on file   Number of children: 2   Years of education: 14   Highest education level: Not on file  Occupational History   Occupation: Retired  Tobacco Use   Smoking status: Former    Packs/day: 0.10    Years: 10.00    Additional pack years: 0.00    Total pack years: 1.00    Types: Cigarettes    Quit date: 12/07/1979    Years since quitting: 43.2    Passive exposure: Current   Smokeless tobacco: Never  Vaping Use   Vaping Use: Never used  Substance and Sexual Activity   Alcohol use: No    Alcohol/week: 0.0 standard drinks of alcohol   Drug use: No    Sexual activity: Not Currently  Other Topics Concern   Not on file  Social History Narrative   Walks with cane   Right handed    Caffeine use: Coffee (2 cups every morning)   Tea: sometimes   Soda: none   Social Determinants of Health   Financial Resource Strain: Medium Risk (10/05/2017)   Overall Financial Resource Strain (CARDIA)    Difficulty of Paying Living Expenses: Somewhat hard  Food Insecurity: No Food Insecurity (10/25/2022)   Hunger Vital Sign    Worried About Running Out of Food in the Last Year: Never true    Ran Out of Food in the Last Year: Never true  Transportation Needs: No Transportation Needs (10/25/2022)   PRAPARE - Administrator, Civil Service (Medical): No    Lack of Transportation (Non-Medical): No  Physical Activity: Inactive (10/05/2017)   Exercise Vital Sign    Days of Exercise per Week: 0 days    Minutes of Exercise per Session: 0 min  Stress: Stress Concern Present (10/05/2017)   [employer] of Occupational Health - Occupational Stress Questionnaire    Feeling of Stress : Rather much  Social Connections:  Moderately Isolated (10/05/2017)   Social Connection and Isolation Panel [NHANES]    Frequency of Communication with Friends and Family: More than three times a week    Frequency of Social Gatherings with Friends and Family: More than three times a week    Attends Religious Services: Never    Database administrator or Organizations: No    Attends Banker Meetings: Never    Marital Status: Widowed   Past Surgical History:  Procedure Laterality Date   ABDOMINAL HYSTERECTOMY  1974   abdominal tumor  2002   APPENDECTOMY     APPLICATION OF A-CELL OF BACK N/A 09/26/2018   Procedure: With Acell;  Surgeon: Peggye Form, DO;  Location: [facility];  Service: Plastics;  Laterality: N/A;   APPLICATION OF WOUND VAC N/A 09/26/2018   Procedure: Vac placement;  Surgeon: Peggye Form, DO;  Location: [facility];  Service: Plastics;   Laterality: N/A;   BACK SURGERY  1982   BRONCHOSCOPY  2001   CATARACT EXTRACTION, BILATERAL  09/2019, 10/2019   CHOLECYSTECTOMY  1984   INCISION AND DRAINAGE OF WOUND N/A 09/26/2018   Procedure: Debridement of spine wound;  Surgeon: Peggye Form, DO;  Location: [facility];  Service: Plastics;  Laterality: N/A;   KNEE SURGERY Bilateral 08/27/2010 (L) and 01/11/2011 (R)   LUMBAR LAMINECTOMY/DECOMPRESSION MICRODISCECTOMY N/A 07/03/2018   Procedure: Lumbar Three to Lumbar Five Laminectomy;  Surgeon: Jadene Pierini, MD;  Location: [facility];  Service: Neurosurgery;  Laterality: N/A;   LUMBAR WOUND DEBRIDEMENT N/A 08/20/2018   Procedure: POSTERIOR LUMBAR SPINAL WOUND DEBRIDEMENT AND REVISION;  Surgeon: Jadene Pierini, MD;  Location: [facility];  Service: Neurosurgery;  Laterality: N/A;  POSTERIOR LUMBAR SPINAL WOUND REVISION   miscarrage  1962   OVARIAN CYST SURGERY  1968   thymus tumor     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   TONSILLECTOMY     Past Surgical History:  Procedure Laterality Date   ABDOMINAL HYSTERECTOMY  1974   abdominal tumor  2002   APPENDECTOMY     APPLICATION OF A-CELL OF BACK N/A 09/26/2018   Procedure: With Acell;  Surgeon: Peggye Form, DO;  Location: [facility];  Service: Plastics;  Laterality: N/A;   APPLICATION OF WOUND VAC N/A 09/26/2018   Procedure: Vac placement;  Surgeon: Peggye Form, DO;  Location: [facility];  Service: Plastics;  Laterality: N/A;   BACK SURGERY  1982   BRONCHOSCOPY  2001   CATARACT EXTRACTION, BILATERAL  09/2019, 10/2019   CHOLECYSTECTOMY  1984   INCISION AND DRAINAGE OF WOUND N/A 09/26/2018   Procedure: Debridement of spine wound;  Surgeon: Peggye Form, DO;  Location: [facility];  Service: Plastics;  Laterality: N/A;   KNEE SURGERY Bilateral 08/27/2010 (L) and 01/11/2011 (R)   LUMBAR LAMINECTOMY/DECOMPRESSION MICRODISCECTOMY N/A 07/03/2018   Procedure: Lumbar Three to Lumbar Five Laminectomy;  Surgeon: Jadene Pierini, MD;  Location: [facility];   Service: Neurosurgery;  Laterality: N/A;   LUMBAR WOUND DEBRIDEMENT N/A 08/20/2018   Procedure: POSTERIOR LUMBAR SPINAL WOUND DEBRIDEMENT AND REVISION;  Surgeon: Jadene Pierini, MD;  Location: [facility];  Service: Neurosurgery;  Laterality: N/A;  POSTERIOR LUMBAR SPINAL WOUND REVISION   miscarrage  1962   OVARIAN CYST SURGERY  1968   thymus tumor     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   TONSILLECTOMY     Past Medical History:  Diagnosis Date   Abnormality of gait 04/19/2016   Acute infective polyneuritis (HCC)  2002   Allergic rhinitis due to pollen    Chronic pain syndrome    COPD (chronic obstructive pulmonary disease) (HCC)    chronic bronchitis   Depressive disorder, not elsewhere classified    Diabetes mellitus without complication (HCC)    Diaphragmatic hernia without mention of obstruction or gangrene    Dyslipidemia    Fibromyalgia    GERD (gastroesophageal reflux disease)    with h/o esophagitis   Guillain-Barre syndrome (HCC)    History of benign thymus tumor    Insomnia, unspecified    Lumbar spinal stenosis 03/30/2018   L4-5 level, severe   Miscarriage 1962   Mixed hyperlipidemia    Morbid obesity (HCC)    Osteoporosis    Pneumonia    Polyneuropathy in diabetes(357.2)    Rheumatoid arthritis(714.0)    Spondylosis, lumbosacral    Spontaneous ecchymoses    Type II or unspecified type diabetes mellitus with peripheral circulatory disorders, uncontrolled(250.72)    Unspecified chronic bronchitis (HCC)    Unspecified essential hypertension    Unspecified hypothyroidism    Unspecified pruritic disorder    Unspecified urinary incontinence    BP 135/75   Pulse 64   Ht 5\' 6"  (1.676 m)   Wt 214 lb 6.4 oz (97.3 kg)   SpO2 94%   BMI 34.61 kg/m   Opioid Risk Score:   Fall Risk Score:  `1  Depression screen [facility] 2/9     01/24/2023   12:54 PM 12/28/2022   10:52 AM 11/28/2022   11:21 AM 09/01/2022   10:53 AM 06/16/2022    9:46 AM 04/11/2022    8:22 AM 02/17/2022   11:22 AM   Depression screen [facility] 2/9  Decreased Interest 0 0 0 0 0 0 0  Down, Depressed, Hopeless 0 0 0 0 0 0 0  PHQ - 2 Score 0 0 0 0 0 0 0     Review of Systems  Musculoskeletal:  Positive for arthralgias.       Pian in both arms, legs, hips, knees, feet   All other systems reviewed and are negative.     Objective:   Physical Exam        Assessment & Plan:

## 2023-02-27 ENCOUNTER — Encounter: Payer: Medicare Other | Admitting: Family

## 2023-02-27 NOTE — Progress Notes (Signed)
  This encounter was created in error - please disregard. No show 

## 2023-02-27 NOTE — Patient Instructions (Signed)
1.) Visit your local pharmacy to receive your shingrix and covid boosters  2.) If your last eye exam was greater than 12 months ago, contact your eye doctor to schedule a diabetic eye exam, and have results faxed to [phone].

## 2023-03-05 ENCOUNTER — Other Ambulatory Visit: Payer: Self-pay | Admitting: Internal Medicine

## 2023-03-07 ENCOUNTER — Ambulatory Visit: Payer: Medicare Other | Admitting: Family

## 2023-03-07 ENCOUNTER — Encounter: Payer: Self-pay | Admitting: Family

## 2023-03-07 VITALS — BP 138/70 | HR 86 | Temp 97.1°F | Resp 18 | Ht 66.0 in | Wt 217.0 lb

## 2023-03-07 DIAGNOSIS — Z5941 Food insecurity: Secondary | ICD-10-CM | POA: Diagnosis not present

## 2023-03-07 DIAGNOSIS — Z0000 Encounter for general adult medical examination without abnormal findings: Secondary | ICD-10-CM

## 2023-03-07 NOTE — Patient Instructions (Signed)
Katherine Delgado , Thank you for taking time to come for your Medicare Wellness Visit. I appreciate your ongoing commitment to your health goals. Please review the following plan we discussed and let me know if I can assist you in the future.   Screening recommendations/referrals: Colonoscopy : N/A  Mammogram :  Bone Density : Up to date  Recommended yearly ophthalmology/optometry visit for glaucoma screening and checkup Recommended yearly dental visit for hygiene and checkup  Vaccinations: Influenza vaccine- due annually in September/October Pneumococcal vaccine : Up to date  Tdap vaccine : Up to date  Shingles vaccine : Due     Advanced directives: Yes   Conditions/risks identified: advanced age (>98men, >93 women);diabetes mellitus;hypertension;sedentary lifestyle;obesity (BMI >30kg/m2)  Next appointment: 1 year    Preventive Care 15 Years and Older, Female Preventive care refers to lifestyle choices and visits with your health care provider that can promote health and wellness. What does preventive care include? A yearly physical exam. This is also called an annual well check. Dental exams once or twice a year. Routine eye exams. Ask your health care provider how often you should have your eyes checked. Personal lifestyle choices, including: Daily care of your teeth and gums. Regular physical activity. Eating a healthy diet. Avoiding tobacco and drug use. Limiting alcohol use. Practicing safe sex. Taking low-dose aspirin every day. Taking vitamin and mineral supplements as recommended by your health care provider. What happens during an annual well check? The services and screenings done by your health care provider during your annual well check will depend on your age, overall health, lifestyle risk factors, and family history of disease. Counseling  Your health care provider may ask you questions about your: Alcohol use. Tobacco use. Drug use. Emotional well-being. Home  and relationship well-being. Sexual activity. Eating habits. History of falls. Memory and ability to understand (cognition). Work and work Astronomer. Reproductive health. Screening  You may have the following tests or measurements: Height, weight, and BMI. Blood pressure. Lipid and cholesterol levels. These may be checked every 5 years, or more frequently if you are over 37 years old. Skin check. Lung cancer screening. You may have this screening every year starting at age 50 if you have a 30-pack-year history of smoking and currently smoke or have quit within the past 15 years. Fecal occult blood test (FOBT) of the stool. You may have this test every year starting at age 55. Flexible sigmoidoscopy or colonoscopy. You may have a sigmoidoscopy every 5 years or a colonoscopy every 10 years starting at age 4. Hepatitis C blood test. Hepatitis B blood test. Sexually transmitted disease (STD) testing. Diabetes screening. This is done by checking your blood sugar (glucose) after you have not eaten for a while (fasting). You may have this done every 1-3 years. Bone density scan. This is done to screen for osteoporosis. You may have this done starting at age 21. Mammogram. This may be done every 1-2 years. Talk to your health care provider about how often you should have regular mammograms. Talk with your health care provider about your test results, treatment options, and if necessary, the need for more tests. Vaccines  Your health care provider may recommend certain vaccines, such as: Influenza vaccine. This is recommended every year. Tetanus, diphtheria, and acellular pertussis (Tdap, Td) vaccine. You may need a Td booster every 10 years. Zoster vaccine. You may need this after age 72. Pneumococcal 13-valent conjugate (PCV13) vaccine. One dose is recommended after age 47. Pneumococcal polysaccharide (PPSV23)  vaccine. One dose is recommended after age 32. Talk to your health care provider  about which screenings and vaccines you need and how often you need them. This information is not intended to replace advice given to you by your health care provider. Make sure you discuss any questions you have with your health care provider. Document Released: 09/04/2015 Document Revised: 04/27/2016 Document Reviewed: 06/09/2015 Elsevier Interactive Patient Education  2017 [employer].  Fall Prevention in the Home Falls can cause injuries. They can happen to people of all ages. There are many things you can do to make your home safe and to help prevent falls. What can I do on the outside of my home? Regularly fix the edges of walkways and driveways and fix any cracks. Remove anything that might make you trip as you walk through a door, such as a raised step or threshold. Trim any bushes or trees on the path to your home. Use bright outdoor lighting. Clear any walking paths of anything that might make someone trip, such as rocks or tools. Regularly check to see if handrails are loose or broken. Make sure that both sides of any steps have handrails. Any raised decks and porches should have guardrails on the edges. Have any leaves, snow, or ice cleared regularly. Use sand or salt on walking paths during winter. Clean up any spills in your garage right away. This includes oil or grease spills. What can I do in the bathroom? Use night lights. Install grab bars by the toilet and in the tub and shower. Do not use towel bars as grab bars. Use non-skid mats or decals in the tub or shower. If you need to sit down in the shower, use a plastic, non-slip stool. Keep the floor dry. Clean up any water that spills on the floor as soon as it happens. Remove soap buildup in the tub or shower regularly. Attach bath mats securely with double-sided non-slip rug tape. Do not have throw rugs and other things on the floor that can make you trip. What can I do in the bedroom? Use night lights. Make sure  that you have a light by your bed that is easy to reach. Do not use any sheets or blankets that are too big for your bed. They should not hang down onto the floor. Have a firm chair that has side arms. You can use this for support while you get dressed. Do not have throw rugs and other things on the floor that can make you trip. What can I do in the kitchen? Clean up any spills right away. Avoid walking on wet floors. Keep items that you use a lot in easy-to-reach places. If you need to reach something above you, use a strong step stool that has a grab bar. Keep electrical cords out of the way. Do not use floor polish or wax that makes floors slippery. If you must use wax, use non-skid floor wax. Do not have throw rugs and other things on the floor that can make you trip. What can I do with my stairs? Do not leave any items on the stairs. Make sure that there are handrails on both sides of the stairs and use them. Fix handrails that are broken or loose. Make sure that handrails are as long as the stairways. Check any carpeting to make sure that it is firmly attached to the stairs. Fix any carpet that is loose or worn. Avoid having throw rugs at the top or bottom  of the stairs. If you do have throw rugs, attach them to the floor with carpet tape. Make sure that you have a light switch at the top of the stairs and the bottom of the stairs. If you do not have them, ask someone to add them for you. What else can I do to help prevent falls? Wear shoes that: Do not have high heels. Have rubber bottoms. Are comfortable and fit you well. Are closed at the toe. Do not wear sandals. If you use a stepladder: Make sure that it is fully opened. Do not climb a closed stepladder. Make sure that both sides of the stepladder are locked into place. Ask someone to hold it for you, if possible. Clearly mark and make sure that you can see: Any grab bars or handrails. First and last steps. Where the edge of  each step is. Use tools that help you move around (mobility aids) if they are needed. These include: Canes. Walkers. Scooters. Crutches. Turn on the lights when you go into a dark area. Replace any light bulbs as soon as they burn out. Set up your furniture so you have a clear path. Avoid moving your furniture around. If any of your floors are uneven, fix them. If there are any pets around you, be aware of where they are. Review your medicines with your doctor. Some medicines can make you feel dizzy. This can increase your chance of falling. Ask your doctor what other things that you can do to help prevent falls. This information is not intended to replace advice given to you by your health care provider. Make sure you discuss any questions you have with your health care provider. Document Released: 06/04/2009 Document Revised: 01/14/2016 Document Reviewed: 09/12/2014 Elsevier Interactive Patient Education  2017 [employer].

## 2023-03-07 NOTE — Progress Notes (Signed)
Subjective:   Katherine Delgado is a 82 y.o. female who presents for Medicare Annual (Subsequent) preventive examination.  Visit Complete: In person  Patient Medicare AWV questionnaire was completed by the patient on 03/07/2023; I have confirmed that all information answered by patient is correct and no changes since this date.  Review of Systems     Cardiac Risk Factors include: advanced age (>67men, >8 women);diabetes mellitus;hypertension;sedentary lifestyle;obesity (BMI >30kg/m2)     Objective:    Today's Vitals   03/07/23 0837 03/07/23 0927  BP: 138/70   Pulse: 86   Resp: 18   Temp: (!) 97.1 F (36.2 C)   SpO2: 96%   Weight: 217 lb (98.4 kg)   Height: 5\' 6"  (1.676 m)   PainSc:  7    Body mass index is 35.02 kg/m.     03/07/2023    8:36 AM 02/27/2023   10:00 AM 08/31/2022   10:48 AM 06/28/2022   10:44 AM 02/17/2022   11:26 AM 02/01/2022    1:50 PM 06/24/2021    9:40 AM  Advanced Directives  Does Patient Have a Medical Advance Directive? Yes Yes Yes No Yes Yes Yes  Type of Advance Directive Out of facility DNR (pink MOST or yellow form) Out of facility DNR (pink MOST or yellow form) Living will;Healthcare Power of Attorney  Out of facility DNR (pink MOST or yellow form) Out of facility DNR (pink MOST or yellow form) Healthcare Power of Attorney  Does patient want to make changes to medical advance directive? No - Patient declined No - Patient declined No - Patient declined  No - Patient declined No - Patient declined   Copy of Healthcare Power of Attorney in Chart?   No - copy requested      Would patient like information on creating a medical advance directive?    No - Patient declined     Pre-existing out of facility DNR order (yellow form or pink MOST form) Pink MOST form placed in chart (order not valid for inpatient use) Pink MOST form placed in chart (order not valid for inpatient use)         Current Medications (verified) Outpatient Encounter Medications as of  03/07/2023  Medication Sig   amitriptyline (ELAVIL) 50 MG tablet Take 1 tablet (50 mg total) by mouth at bedtime.   amLODipine (NORVASC) 10 MG tablet TAKE 1 TABLET(10 MG) BY MOUTH DAILY   aspirin EC 81 MG tablet Take 81 mg by mouth 2 (two) times a week.   augmented betamethasone dipropionate (DIPROLENE-AF) 0.05 % cream Apply topically 2 (two) times daily as needed.   calamine lotion Apply 1 application topically 3 (three) times daily. To affected areas on right fore arm   cholecalciferol (VITAMIN D3) 25 MCG (1000 UT) tablet Take 1,000 Units by mouth daily.   Cranberry 475 MG CAPS Take 1 capsule (475 mg total) by mouth 2 (two) times daily.   docusate sodium (COLACE) 100 MG capsule Take 100 mg by mouth daily as needed for mild constipation.   DULoxetine (CYMBALTA) 20 MG capsule TAKE 1 CAPSULE(20 MG) BY MOUTH DAILY   ezetimibe (ZETIA) 10 MG tablet Take 1 tablet (10 mg total) by mouth daily.   fluticasone (CUTIVATE) 0.05 % cream Apply topically 2 (two) times daily as needed.   fluticasone (FLONASE) 50 MCG/ACT nasal spray SHAKE LIQUID AND USE 2 SPRAYS IN EACH NOSTRIL AT BEDTIME   folic acid (FOLVITE) 1 MG tablet Take 2 tablets (2 mg total) by  mouth daily.   furosemide (LASIX) 20 MG tablet TAKE 1 BY MOUTH TWICE DAILY FOR 3 DAYS AS NEEDED FOR 3 POUND WEIGHT GAIN IN ONE DAY OR 5 POUNDS IN ONE WEEK   gabapentin (NEURONTIN) 600 MG tablet TAKE 1 TABLET(600 MG) BY MOUTH THREE TIMES DAILY   insulin glargine, 1 Unit Dial, (TOUJEO SOLOSTAR) 300 UNIT/ML Solostar Pen 45 units QAM and 40 units  QPM   insulin lispro (HUMALOG KWIKPEN) 200 UNIT/ML KwikPen Inject into the skin.   Insulin Pen Needle (B-D UF III MINI PEN NEEDLES) 31G X 5 MM MISC 1 Device by Other route in the morning, at noon, in the evening, and at bedtime.   ipratropium-albuterol (DUONEB) 0.5-2.5 (3) MG/3ML SOLN Take 3 mLs by nebulization every 6 (six) hours as needed.   ketoconazole (NIZORAL) 2 % cream Apply 1 application topically 2 (two) times  daily.   levothyroxine (SYNTHROID) 100 MCG tablet TAKE 1 TABLET(100 MCG) BY MOUTH DAILY   lidocaine (LIDODERM) 5 % PLACE 1 PATCH ONTO THE SKIN DAILY AS DIRECTED, REMOVE AND DISCARD PATCH WITHN 12 HOURS OR AS DIRECTED BY MD   lisinopril (ZESTRIL) 20 MG tablet TAKE 1 TABLET BY MOUTH EVERY DAY   loperamide (IMODIUM A-D) 2 MG tablet Take 2 mg by mouth 4 (four) times daily as needed for diarrhea or loose stools.   magnesium oxide (MAG-OX) 400 MG tablet Take by mouth.   Melatonin 10 MG TABS Take 10 mg by mouth at bedtime as needed (sleep).   Menthol, Topical Analgesic, (ICY HOT MEDICATED SPRAY EX) Apply 1 application topically as needed (shoulder pain).   metoprolol tartrate (LOPRESSOR) 50 MG tablet TAKE 1 TABLET BY MOUTH TWICE DAILY   Multiple Vitamins-Minerals (MULTIVITAMIN ADULT PO) Take 1 tablet by mouth daily.   nystatin (MYCOSTATIN/NYSTOP) powder Apply 1 application topically 3 (three) times daily.   nystatin cream (MYCOSTATIN) Apply 1 application topically 3 (three) times daily. To skin folds   omega-3 acid ethyl esters (LOVAZA) 1 g capsule TAKE ONE CAPSULE BY MOUTH EVERY DAY   ondansetron (ZOFRAN) 4 MG tablet Take 1 tablet (4 mg total) by mouth every 8 (eight) hours as needed for nausea or vomiting.   oxyCODONE-acetaminophen (PERCOCET) 10-325 MG tablet Take 1 tablet by mouth every 4 (four) hours as needed for pain.   polyvinyl alcohol (LIQUIFILM TEARS) 1.4 % ophthalmic solution Place 1 drop into both eyes 4 (four) times daily as needed (for dry/irritated eyes).    potassium chloride (MICRO-K) 10 MEQ CR capsule TAKE 2 CAPSULES(20 MEQ) BY MOUTH TWICE DAILY WHEN AS NEEDED WHEN TAKING A DOSE OF LASIX   promethazine (PHENERGAN) 12.5 MG tablet    Semaglutide, 1 MG/DOSE, 4 MG/3ML SOPN Inject 1 mg as directed once a week.   sodium chloride (OCEAN) 0.65 % nasal spray Place 2 sprays into the nose as needed for congestion.   tiotropium (SPIRIVA) 18 MCG inhalation capsule Place 18 mcg into inhaler and  inhale as needed.   tiZANidine (ZANAFLEX) 2 MG tablet TAKE 1 TABLET(2 MG) BY MOUTH THREE TIMES DAILY   triamterene-hydrochlorothiazide (MAXZIDE-25) 37.5-25 MG tablet TAKE 1 TABLET BY MOUTH DAILY   venlafaxine XR (EFFEXOR-XR) 75 MG 24 hr capsule TAKE 1 CAPSULE BY MOUTH EVERY DAY WITH BREAKFAST   Vitamin D, Ergocalciferol, (DRISDOL) 1.25 MG (50000 UNIT) CAPS capsule Take 1 capsule (50,000 Units total) by mouth every 7 (seven) days.   [DISCONTINUED] levothyroxine (SYNTHROID) 137 MCG tablet    [DISCONTINUED] MYRBETRIQ 50 MG TB24 tablet TAKE 1 TABLET(50 MG)  BY MOUTH DAILY   [DISCONTINUED] XELJANZ 5 MG TABS TAKE 1 TABLET BY MOUTH DAILY   No facility-administered encounter medications on file as of 03/07/2023.    Allergies (verified) Influenza vaccines, Penicillins, Statins, and Sulfamethoxazole-trimethoprim   History: Past Medical History:  Diagnosis Date   Abnormality of gait 04/19/2016   Acute infective polyneuritis (HCC) 2002   Allergic rhinitis due to pollen    Chronic pain syndrome    COPD (chronic obstructive pulmonary disease) (HCC)    chronic bronchitis   Depressive disorder, not elsewhere classified    Diabetes mellitus without complication (HCC)    Diaphragmatic hernia without mention of obstruction or gangrene    Dyslipidemia    Fibromyalgia    GERD (gastroesophageal reflux disease)    with h/o esophagitis   Guillain-Barre syndrome (HCC)    History of benign thymus tumor    Insomnia, unspecified    Lumbar spinal stenosis 03/30/2018   L4-5 level, severe   Miscarriage 1962   Mixed hyperlipidemia    Morbid obesity (HCC)    Osteoporosis    Pneumonia    Polyneuropathy in diabetes(357.2)    Rheumatoid arthritis(714.0)    Spondylosis, lumbosacral    Spontaneous ecchymoses    Type II or unspecified type diabetes mellitus with peripheral circulatory disorders, uncontrolled(250.72)    Unspecified chronic bronchitis (HCC)    Unspecified essential hypertension    Unspecified  hypothyroidism    Unspecified pruritic disorder    Unspecified urinary incontinence    Past Surgical History:  Procedure Laterality Date   ABDOMINAL HYSTERECTOMY  1974   abdominal tumor  2002   APPENDECTOMY     APPLICATION OF A-CELL OF BACK N/A 09/26/2018   Procedure: With Acell;  Surgeon: Peggye Form, DO;  Location: [facility] OR;  Service: Plastics;  Laterality: N/A;   APPLICATION OF WOUND VAC N/A 09/26/2018   Procedure: Vac placement;  Surgeon: Peggye Form, DO;  Location: [facility] OR;  Service: Plastics;  Laterality: N/A;   BACK SURGERY  1982   BRONCHOSCOPY  2001   CATARACT EXTRACTION, BILATERAL  09/2019, 10/2019   CHOLECYSTECTOMY  1984   INCISION AND DRAINAGE OF WOUND N/A 09/26/2018   Procedure: Debridement of spine wound;  Surgeon: Peggye Form, DO;  Location: [facility] OR;  Service: Plastics;  Laterality: N/A;   KNEE SURGERY Bilateral 08/27/2010 (L) and 01/11/2011 (R)   LUMBAR LAMINECTOMY/DECOMPRESSION MICRODISCECTOMY N/A 07/03/2018   Procedure: Lumbar Three to Lumbar Five Laminectomy;  Surgeon: Jadene Pierini, MD;  Location: [facility] OR;  Service: Neurosurgery;  Laterality: N/A;   LUMBAR WOUND DEBRIDEMENT N/A 08/20/2018   Procedure: POSTERIOR LUMBAR SPINAL WOUND DEBRIDEMENT AND REVISION;  Surgeon: Jadene Pierini, MD;  Location: [facility] OR;  Service: Neurosurgery;  Laterality: N/A;  POSTERIOR LUMBAR SPINAL WOUND REVISION   miscarrage  1962   OVARIAN CYST SURGERY  1968   thymus tumor     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   TONSILLECTOMY     Family History  Problem Relation Age of Onset   Alzheimer's disease Mother    Heart disease Mother    Heart disease Father    Liver disease Father    Cancer Brother    Arthritis Son    Colon polyps Brother    Colon cancer Neg Hx    Esophageal cancer Neg Hx    Kidney disease Neg Hx    Stomach cancer Neg Hx    Rectal cancer Neg Hx    Social History   Socioeconomic History  Marital status: Widowed    Spouse name: Not on file   Number of  children: 2   Years of education: 14   Highest education level: Not on file  Occupational History   Occupation: Retired  Tobacco Use   Smoking status: Former    Current packs/day: 0.00    Average packs/day: 0.1 packs/day for 10.0 years (1.0 ttl pk-yrs)    Types: Cigarettes    Start date: 12/06/1969    Quit date: 12/07/1979    Years since quitting: 43.2    Passive exposure: Current   Smokeless tobacco: Never  Vaping Use   Vaping status: Never Used  Substance and Sexual Activity   Alcohol use: No    Alcohol/week: 0.0 standard drinks of alcohol   Drug use: No   Sexual activity: Not Currently  Other Topics Concern   Not on file  Social History Narrative   Walks with cane   Right handed    Caffeine use: Coffee (2 cups every morning)   Tea: sometimes   Soda: none   Social Determinants of Health   Financial Resource Strain: Medium Risk (10/05/2017)   Overall Financial Resource Strain (CARDIA)    Difficulty of Paying Living Expenses: Somewhat hard  Food Insecurity: No Food Insecurity (10/25/2022)   Hunger Vital Sign    Worried About Running Out of Food in the Last Year: Never true    Ran Out of Food in the Last Year: Never true  Transportation Needs: No Transportation Needs (10/25/2022)   PRAPARE - Administrator, Civil Service (Medical): No    Lack of Transportation (Non-Medical): No  Physical Activity: Inactive (10/05/2017)   Exercise Vital Sign    Days of Exercise per Week: 0 days    Minutes of Exercise per Session: 0 min  Stress: Stress Concern Present (10/05/2017)   [employer] of Occupational Health - Occupational Stress Questionnaire    Feeling of Stress : Rather much  Social Connections: Moderately Isolated (10/05/2017)   Social Connection and Isolation Panel [NHANES]    Frequency of Communication with Friends and Family: More than three times a week    Frequency of Social Gatherings with Friends and Family: More than three times a week    Attends  Religious Services: Never    Database administrator or Organizations: No    Attends Banker Meetings: Never    Marital Status: Widowed    Tobacco Counseling Counseling given: Not Answered   Clinical Intake:  Pre-visit preparation completed: No  Pain : 0-10 Pain Score: 7  Pain Type: Chronic pain Pain Location: Generalized (Fibromylagia,Hands ,knees) Pain Orientation: Lower Pain Radiating Towards: No Pain Descriptors / Indicators: Aching, Sharp Pain Onset: Other (comment) (several years) Pain Frequency: Constant Pain Relieving Factors: pain medication ,heat /ice compresor ,exercise or movement /change of position. Effect of Pain on Daily Activities: unable to do social things and her ADL's or shopping  Pain Relieving Factors: pain medication ,heat /ice compresor ,exercise or movement /change of position.  BMI - recorded: 35.02 Nutritional Status: BMI > 30  Obese Nutritional Risks: None Diabetes: Yes CBG done?: No (110 has continuous monitor) Did pt. bring in CBG monitor from home?: Yes (110's - 170's) Glucose Meter Downloaded?: No  How often do you need to have someone help you when you read instructions, pamphlets, or other written materials from your doctor or pharmacy?: 1 - Never What is the last grade level you completed in school?: Junior  college 14 yrs  Interpreter Needed?: No      Activities of Daily Living    03/07/2023    9:47 AM  In your present state of health, do you have any difficulty performing the following activities:  Hearing? 0  Vision? 0  Difficulty concentrating or making decisions? 1  Comment Remembering  Walking or climbing stairs? 1  Comment uses walker/wheelchair  Dressing or bathing? 1  Comment unable to fasten bra  Doing errands, shopping? 1  Comment son Ship broker and eating ? N  Using the Toilet? Y  Comment incontinent  In the past six months, have you accidently leaked urine? Y  Comment incontinent   Do you have problems with loss of bowel control? N  Managing your Medications? N  Managing your Finances? N  Housekeeping or managing your Housekeeping? Y  Comment son assist    Patient Care Team: Judee Hennick, Donalee Citrin, NP as PCP - General (Family Medicine) Pollyann Savoy, MD (Rheumatology) Altheimer, Casimiro Needle, MD as Attending Physician (Endocrinology) Nadara Mustard, MD as Consulting Physician (Orthopedic Surgery)  Indicate any recent Medical Services you may have received from other than [facility] providers in the past year (date may be approximate).     Assessment:   This is a routine wellness examination for Katherine Delgado.  Hearing/Vision screen No results found.  Dietary issues and exercise activities discussed:     Goals Addressed   None    Depression Screen    03/07/2023    8:36 AM 02/24/2023    9:31 AM 01/24/2023   12:54 PM 12/28/2022   10:52 AM 11/28/2022   11:21 AM 09/01/2022   10:53 AM 06/16/2022    9:46 AM  PHQ 2/9 Scores  PHQ - 2 Score 0 0 0 0 0 0 0  Exception Documentation Other- indicate reason in comment box        Not completed AWV          Fall Risk    03/07/2023    8:36 AM 02/24/2023    9:31 AM 01/24/2023   12:53 PM 12/28/2022   10:52 AM 11/28/2022   11:21 AM  Fall Risk   Falls in the past year? 0 0 0 0 0  Number falls in past yr: 0 0 0  0  Injury with Fall? 0  0  0  Risk for fall due to : History of fall(s);Impaired balance/gait Impaired balance/gait     Follow up Falls evaluation completed        MEDICARE RISK AT HOME:   TIMED UP AND GO:  Was the test performed?  No patient on wheelchair during visit     Cognitive Function:    03/07/2023    9:16 AM 10/05/2017   10:13 AM 07/07/2016    9:17 AM  MMSE - Mini Mental State Exam  Orientation to time 5 5 5   Orientation to Place 5 5 5   Registration 3 3 3   Attention/ Calculation 5 5 5   Recall 3 3 3   Language- name 2 objects 2 2 2   Language- repeat 1 1 1   Language- follow 3 step command 3 3 3   Language- read &  follow direction 1 1 1   Write a sentence 1 1 1   Copy design 1 1 1   Total score 30 30 30         02/17/2022   11:24 AM 02/10/2021   11:17 AM 01/14/2020    1:30 PM 01/11/2019    2:30 PM  6CIT Screen  What Year?  0 points 0 points 0 points 0 points  What month? 0 points 0 points 0 points 0 points  What time? 0 points 0 points 0 points 0 points  Count back from 20 0 points 0 points 0 points 0 points  Months in reverse 0 points 0 points 0 points 0 points  Repeat phrase 2 points 4 points 0 points 0 points  Total Score 2 points 4 points 0 points 0 points    Immunizations Immunization History  Administered Date(s) Administered   Influenza-Unspecified 06/10/2011   PFIZER(Purple Top)SARS-COV-2 Vaccination 11/13/2019, 11/30/2019   Pneumococcal Conjugate-13 07/07/2016   Pneumococcal Polysaccharide-23 11/21/2003, 10/05/2017   Tdap 05/28/2016    TDAP status: Up to date  Flu Vaccine status: Up to date  Pneumococcal vaccine status: Up to date  Covid-19 vaccine status: Information provided on how to obtain vaccines.   Qualifies for Shingles Vaccine? Yes   Zostavax completed No   Shingrix Completed?: No.    Education has been provided regarding the importance of this vaccine. Patient has been advised to call insurance company to determine out of pocket expense if they have not yet received this vaccine. Advised may also receive vaccine at local pharmacy or Health Dept. Verbalized acceptance and understanding.  Screening Tests Health Maintenance  Topic Date Due   Zoster Vaccines- Shingrix (1 of 2) Never done   COVID-19 Vaccine (3 - Pfizer risk series) 12/28/2019   OPHTHALMOLOGY EXAM  03/25/2022   Diabetic kidney evaluation - Urine ACR  09/02/2022   Diabetic kidney evaluation - eGFR measurement  03/04/2023   HEMOGLOBIN A1C  04/03/2023   FOOT EXAM  10/04/2023   Medicare Annual Wellness (AWV)  03/06/2024   DEXA SCAN  09/20/2025   DTaP/Tdap/Td (2 - Td or Tdap) 05/28/2026   Pneumonia  Vaccine 74+ Years old  Completed   HPV VACCINES  Aged Out   INFLUENZA VACCINE  Discontinued   Hepatitis C Screening  Discontinued    Health Maintenance  Health Maintenance Due  Topic Date Due   Zoster Vaccines- Shingrix (1 of 2) Never done   COVID-19 Vaccine (3 - Pfizer risk series) 12/28/2019   OPHTHALMOLOGY EXAM  03/25/2022   Diabetic kidney evaluation - Urine ACR  09/02/2022   Diabetic kidney evaluation - eGFR measurement  03/04/2023    Colorectal cancer screening: No longer required.   Mammogram status: No longer required due to advance age .  Bone Density status: Completed 09/21/2015. Results reflect: Bone density results: OSTEOPENIA. Repeat every 2 years.  Lung Cancer Screening: (Low Dose CT Chest recommended if Age 65-80 years, 20 pack-year currently smoking OR have quit w/in 15years.) does not qualify.   Lung Cancer Screening Referral: No   Additional Screening:  Hepatitis C Screening: does qualify; Completed Yes   Vision Screening: Recommended annual ophthalmology exams for early detection of glaucoma and other disorders of the eye. Is the patient up to date with their annual eye exam?  No  Who is the provider or what is the name of the office in which the patient attends annual eye exams? Follows up with [facility] eye  If pt is not established with a provider, would they like to be referred to a provider to establish care? No .   Dental Screening: Recommended annual dental exams for proper oral hygiene  Diabetic Foot Exam: Diabetic Foot Exam: Completed 10/03/2022   Community Resource Referral / Chronic Care Management: CRR required this visit?  No   CCM required this visit?  PCP informed  of CCM need     Plan:     I have personally reviewed and noted the following in the patient's chart:   Medical and social history Use of alcohol, tobacco or illicit drugs  Current medications and supplements including opioid prescriptions. Patient is currently taking  opioid prescriptions. Information provided to patient regarding non-opioid alternatives. Patient advised to discuss non-opioid treatment plan with their provider. Functional ability and status Nutritional status Physical activity Advanced directives List of other physicians Hospitalizations, surgeries, and ER visits in previous 12 months Vitals Screenings to include cognitive, depression, and falls Referrals and appointments  In addition, I have reviewed and discussed with patient certain preventive protocols, quality metrics, and best practice recommendations. A written personalized care plan for preventive services as well as general preventive health recommendations were provided to patient.     Caesar Bookman, NP   03/07/2023   After Visit Summary: (Pick Up) Due to this being a telephonic visit, with patients personalized plan was offered to patient and patient has requested to Pick up at office.In office   Nurse Notes: she will schedule appointment for podiatrist and opthalmology  Advised to get shingles vaccine and COVID-19 vaccine at the pharmacy

## 2023-03-08 ENCOUNTER — Telehealth: Payer: Self-pay | Admitting: *Deleted

## 2023-03-08 ENCOUNTER — Ambulatory Visit (INDEPENDENT_AMBULATORY_CARE_PROVIDER_SITE_OTHER): Payer: Medicare Other | Admitting: Family

## 2023-03-08 ENCOUNTER — Encounter: Payer: Self-pay | Admitting: Family

## 2023-03-08 VITALS — BP 128/62 | HR 68 | Resp 16 | Ht 66.0 in | Wt 216.8 lb

## 2023-03-08 DIAGNOSIS — I739 Peripheral vascular disease, unspecified: Secondary | ICD-10-CM | POA: Diagnosis not present

## 2023-03-08 DIAGNOSIS — B372 Candidiasis of skin and nail: Secondary | ICD-10-CM | POA: Diagnosis not present

## 2023-03-08 DIAGNOSIS — B0223 Postherpetic polyneuropathy: Secondary | ICD-10-CM

## 2023-03-08 DIAGNOSIS — R300 Dysuria: Secondary | ICD-10-CM

## 2023-03-08 DIAGNOSIS — E1142 Type 2 diabetes mellitus with diabetic polyneuropathy: Secondary | ICD-10-CM

## 2023-03-08 DIAGNOSIS — Z794 Long term (current) use of insulin: Secondary | ICD-10-CM | POA: Diagnosis not present

## 2023-03-08 DIAGNOSIS — H9193 Unspecified hearing loss, bilateral: Secondary | ICD-10-CM | POA: Diagnosis not present

## 2023-03-08 DIAGNOSIS — R600 Localized edema: Secondary | ICD-10-CM | POA: Diagnosis not present

## 2023-03-08 DIAGNOSIS — R490 Dysphonia: Secondary | ICD-10-CM

## 2023-03-08 DIAGNOSIS — J449 Chronic obstructive pulmonary disease, unspecified: Secondary | ICD-10-CM | POA: Diagnosis not present

## 2023-03-08 LAB — POCT URINALYSIS DIPSTICK
Bilirubin, UA: NEGATIVE
Glucose, UA: NEGATIVE
Ketones, UA: NEGATIVE
Nitrite, UA: POSITIVE
Protein, UA: POSITIVE — AB
Spec Grav, UA: 1.015 (ref 1.010–1.025)
Urobilinogen, UA: 0.2 E.U./dL
pH, UA: 5 (ref 5.0–8.0)

## 2023-03-08 MED ORDER — BETAMETHASONE DIPROPIONATE AUG 0.05 % EX CREA: TOPICAL_CREAM | Freq: Two times a day (BID) | CUTANEOUS | 3 refills | Status: AC | PRN

## 2023-03-08 MED ORDER — NYSTATIN 100000 UNIT/GM EX CREA
1.0000 | TOPICAL_CREAM | Freq: Three times a day (TID) | CUTANEOUS | 3 refills | Status: DC
Start: 2023-03-08 — End: 2023-07-12

## 2023-03-08 MED ORDER — KETOCONAZOLE 2 % EX CREA
1.0000 | TOPICAL_CREAM | Freq: Two times a day (BID) | CUTANEOUS | 1 refills | Status: AC
Start: 2023-03-08 — End: ?

## 2023-03-08 MED ORDER — FLUCONAZOLE 150 MG PO TABS
150.0000 mg | ORAL_TABLET | Freq: Every day | ORAL | 0 refills | Status: AC
Start: 2023-03-08 — End: 2023-03-11

## 2023-03-08 MED ORDER — NYSTATIN 100000 UNIT/GM EX POWD
1.0000 | Freq: Three times a day (TID) | CUTANEOUS | 3 refills | Status: DC
Start: 2023-03-08 — End: 2024-03-20

## 2023-03-08 MED ORDER — FLUTICASONE PROPIONATE 0.05 % EX CREA: TOPICAL_CREAM | Freq: Two times a day (BID) | CUTANEOUS | 3 refills | Status: AC | PRN

## 2023-03-08 MED ORDER — CALAMINE EX LOTN
1.0000 | TOPICAL_LOTION | Freq: Three times a day (TID) | CUTANEOUS | 0 refills | Status: DC
Start: 2023-03-08 — End: 2024-03-20

## 2023-03-08 NOTE — Progress Notes (Signed)
  Care Coordination  Outreach Note  03/08/2023 Name: Katherine Delgado MRN: [id number] DOB: 10/24/1940   Care Coordination Outreach Attempts: An unsuccessful telephone outreach was attempted today to offer the patient information about available care coordination services.  Follow Up Plan:  Additional outreach attempts will be made to offer the patient care coordination information and services.   Encounter Outcome:  No Answer  Burman Nieves, CCMA Care Coordination Care Guide Direct Dial: [phone]

## 2023-03-08 NOTE — Progress Notes (Signed)
Provider: Richarda Blade FNP-C   Abdulkareem Badolato, Donalee Citrin, NP  Patient Care Team: Cinzia Devos, Donalee Citrin, NP as PCP - General (Family Medicine) Pollyann Savoy, MD (Rheumatology) Altheimer, Casimiro Needle, MD as Attending Physician (Endocrinology) Nadara Mustard, MD as Consulting Physician (Orthopedic Surgery)  Extended Emergency Contact Information Primary Emergency Contact: [facility] Address: [address]          [place], [place] [address] [place] Home Phone: [phone] Relation: Son Secondary Emergency Contact: Bartholome Bill, [place] of [place] Mobile Phone: [phone] Relation: Son  Code Status:  Full Code  Goals of care: Advanced Directive information    03/07/2023    8:36 AM  Advanced Directives  Does Patient Have a Medical Advance Directive? Yes  Type of Advance Directive Out of facility DNR (pink MOST or yellow form)  Does patient want to make changes to medical advance directive? No - Patient declined  Pre-existing out of facility DNR order (yellow form or pink MOST form) Pink MOST form placed in chart (order not valid for inpatient use)     Chief Complaint  Patient presents with   Medical Management of Chronic Issues    6 months follow up     HPI:  Pt is a 82 y.o. female seen today for medical management of chronic diseases.  She complains of worsening yeast infection on under the breast and abdomen with strong odor..states also itching with urination.  Type 2 DM - follows with Endocrinologist. Home CBG in the 110's - 170's.States will make appointment with podiatrist  and opthalmology. Had blister on right toe that drained but now healing.   Hypertension - states B/p runs high in at home sometimes in the 160's -170's though readings here are normal. denies any headache,dizziness,vision changes,fatigue,chest tightness,palpitation,chest pain or shortness of breath.    COPD - no cough or wheezing.Nebs effective.    Constipation - sometimes has diarrhea and other times constipation.takes stool softeners.tries to eat veggies  to help.  Voice hoarseness - states since she had COVID-19 infection.has to clear throat frequently.   Hiatal hernia- symptoms okay as long as she watches what she eats and avoiding spicy.    Past Medical History:  Diagnosis Date   Abnormality of gait 04/19/2016   Acute infective polyneuritis (HCC) 2002   Allergic rhinitis due to pollen    Chronic pain syndrome    COPD (chronic obstructive pulmonary disease) (HCC)    chronic bronchitis   Depressive disorder, not elsewhere classified    Diabetes mellitus without complication (HCC)    Diaphragmatic hernia without mention of obstruction or gangrene    Dyslipidemia    Fibromyalgia    GERD (gastroesophageal reflux disease)    with h/o esophagitis   Guillain-Barre syndrome (HCC)    History of benign thymus tumor    Insomnia, unspecified    Lumbar spinal stenosis 03/30/2018   L4-5 level, severe   Miscarriage 1962   Mixed hyperlipidemia    Morbid obesity (HCC)    Osteoporosis    Pneumonia    Polyneuropathy in diabetes(357.2)    Rheumatoid arthritis(714.0)    Spondylosis, lumbosacral    Spontaneous ecchymoses    Type II or unspecified type diabetes mellitus with peripheral circulatory disorders, uncontrolled(250.72)    Unspecified chronic bronchitis (HCC)    Unspecified essential hypertension    Unspecified hypothyroidism    Unspecified pruritic disorder    Unspecified urinary incontinence    Past Surgical History:  Procedure Laterality Date   ABDOMINAL HYSTERECTOMY  1974   abdominal tumor  2002   APPENDECTOMY     APPLICATION OF A-CELL OF BACK N/A 09/26/2018   Procedure: With Acell;  Surgeon: Peggye Form, DO;  Location: [facility] OR;  Service: Plastics;  Laterality: N/A;   APPLICATION OF WOUND VAC N/A 09/26/2018   Procedure: Vac placement;  Surgeon: Peggye Form, DO;  Location: [facility] OR;  Service: Plastics;   Laterality: N/A;   BACK SURGERY  1982   BRONCHOSCOPY  2001   CATARACT EXTRACTION, BILATERAL  09/2019, 10/2019   CHOLECYSTECTOMY  1984   INCISION AND DRAINAGE OF WOUND N/A 09/26/2018   Procedure: Debridement of spine wound;  Surgeon: Peggye Form, DO;  Location: [facility] OR;  Service: Plastics;  Laterality: N/A;   KNEE SURGERY Bilateral 08/27/2010 (L) and 01/11/2011 (R)   LUMBAR LAMINECTOMY/DECOMPRESSION MICRODISCECTOMY N/A 07/03/2018   Procedure: Lumbar Three to Lumbar Five Laminectomy;  Surgeon: Jadene Pierini, MD;  Location: [facility] OR;  Service: Neurosurgery;  Laterality: N/A;   LUMBAR WOUND DEBRIDEMENT N/A 08/20/2018   Procedure: POSTERIOR LUMBAR SPINAL WOUND DEBRIDEMENT AND REVISION;  Surgeon: Jadene Pierini, MD;  Location: [facility] OR;  Service: Neurosurgery;  Laterality: N/A;  POSTERIOR LUMBAR SPINAL WOUND REVISION   miscarrage  1962   OVARIAN CYST SURGERY  1968   thymus tumor     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   TONSILLECTOMY      Allergies  Allergen Reactions   Influenza Vaccines Other (See Comments)    "h/o Guillain Barre; dr's told me years ago never to take another flu shot as it could relapse Alene Mires; has to do with when vaccine being changed to H1N1 virus"   Penicillins Hives, Itching, Swelling and Rash    Has patient had a PCN reaction causing immediate rash, facial/tongue/throat swelling, SOB or lightheadedness with hypotension:Unknown Has patient had a PCN reaction causing severe rash involving mucus membranes or skin necrosis: Unknown Has patient had a PCN reaction that required hospitalization:Unknown Has patient had a PCN reaction occurring within the last 10 years: Unknown If all of the above answers are "NO", then may proceed with Cephalosporin use.    Statins Other (See Comments)    Myopathy, transaminitis   Sulfamethoxazole-Trimethoprim Itching    Allergies as of 03/08/2023       Reactions   Influenza Vaccines Other (See Comments)   "h/o Guillain Barre; dr's  told me years ago never to take another flu shot as it could relapse Alene Mires; has to do with when vaccine being changed to H1N1 virus"   Penicillins Hives, Itching, Swelling, Rash   Has patient had a PCN reaction causing immediate rash, facial/tongue/throat swelling, SOB or lightheadedness with hypotension:Unknown Has patient had a PCN reaction causing severe rash involving mucus membranes or skin necrosis: Unknown Has patient had a PCN reaction that required hospitalization:Unknown Has patient had a PCN reaction occurring within the last 10 years: Unknown If all of the above answers are "NO", then may proceed with Cephalosporin use.   Statins Other (See Comments)   Myopathy, transaminitis   Sulfamethoxazole-trimethoprim Itching        Medication List        Accurate as of March 08, 2023  9:31 AM. If you have any questions, ask your nurse or doctor.          amitriptyline 50 MG tablet Commonly known as: ELAVIL Take 1 tablet (50 mg total) by mouth at bedtime.   amLODipine  10 MG tablet Commonly known as: NORVASC TAKE 1 TABLET(10 MG) BY MOUTH DAILY   aspirin EC 81 MG tablet Take 81 mg by mouth 2 (two) times a week.   augmented betamethasone dipropionate 0.05 % cream Commonly known as: DIPROLENE-AF Apply topically 2 (two) times daily as needed.   B-D UF III MINI PEN NEEDLES 31G X 5 MM Misc Generic drug: Insulin Pen Needle 1 Device by Other route in the morning, at noon, in the evening, and at bedtime.   calamine lotion Apply 1 application topically 3 (three) times daily. To affected areas on right fore arm   cholecalciferol 25 MCG (1000 UNIT) tablet Commonly known as: VITAMIN D3 Take 1,000 Units by mouth daily.   Cranberry 475 MG Caps Take 1 capsule (475 mg total) by mouth 2 (two) times daily.   docusate sodium 100 MG capsule Commonly known as: COLACE Take 100 mg by mouth daily as needed for mild constipation.   DULoxetine 20 MG capsule Commonly known as:  CYMBALTA TAKE 1 CAPSULE(20 MG) BY MOUTH DAILY   ezetimibe 10 MG tablet Commonly known as: ZETIA Take 1 tablet (10 mg total) by mouth daily.   fluticasone 0.05 % cream Commonly known as: CUTIVATE Apply topically 2 (two) times daily as needed.   fluticasone 50 MCG/ACT nasal spray Commonly known as: FLONASE SHAKE LIQUID AND USE 2 SPRAYS IN EACH NOSTRIL AT BEDTIME   folic acid 1 MG tablet Commonly known as: FOLVITE Take 2 tablets (2 mg total) by mouth daily.   furosemide 20 MG tablet Commonly known as: LASIX TAKE 1 BY MOUTH TWICE DAILY FOR 3 DAYS AS NEEDED FOR 3 POUND WEIGHT GAIN IN ONE DAY OR 5 POUNDS IN ONE WEEK   gabapentin 600 MG tablet Commonly known as: NEURONTIN TAKE 1 TABLET(600 MG) BY MOUTH THREE TIMES DAILY   HumaLOG KwikPen 200 UNIT/ML KwikPen Generic drug: insulin lispro Inject into the skin.   ICY HOT MEDICATED SPRAY EX Apply 1 application topically as needed (shoulder pain).   ipratropium-albuterol 0.5-2.5 (3) MG/3ML Soln Commonly known as: DUONEB Take 3 mLs by nebulization every 6 (six) hours as needed.   ketoconazole 2 % cream Commonly known as: NIZORAL Apply 1 application topically 2 (two) times daily.   levothyroxine 100 MCG tablet Commonly known as: SYNTHROID TAKE 1 TABLET(100 MCG) BY MOUTH DAILY   lidocaine 5 % Commonly known as: LIDODERM PLACE 1 PATCH ONTO THE SKIN DAILY AS DIRECTED, REMOVE AND DISCARD PATCH WITHN 12 HOURS OR AS DIRECTED BY MD   lisinopril 20 MG tablet Commonly known as: ZESTRIL TAKE 1 TABLET BY MOUTH EVERY DAY   loperamide 2 MG tablet Commonly known as: IMODIUM A-D Take 2 mg by mouth 4 (four) times daily as needed for diarrhea or loose stools.   magnesium oxide 400 MG tablet Commonly known as: MAG-OX Take by mouth.   Melatonin 10 MG Tabs Take 10 mg by mouth at bedtime as needed (sleep).   metoprolol tartrate 50 MG tablet Commonly known as: LOPRESSOR TAKE 1 TABLET BY MOUTH TWICE DAILY   MULTIVITAMIN ADULT PO Take  1 tablet by mouth daily.   nystatin cream Commonly known as: MYCOSTATIN Apply 1 application topically 3 (three) times daily. To skin folds   nystatin powder Commonly known as: MYCOSTATIN/NYSTOP Apply 1 application topically 3 (three) times daily.   omega-3 acid ethyl esters 1 g capsule Commonly known as: LOVAZA TAKE ONE CAPSULE BY MOUTH EVERY DAY   ondansetron 4 MG tablet Commonly known as: Zofran  Take 1 tablet (4 mg total) by mouth every 8 (eight) hours as needed for nausea or vomiting.   oxyCODONE-acetaminophen 10-325 MG tablet Commonly known as: Percocet Take 1 tablet by mouth every 4 (four) hours as needed for pain.   polyvinyl alcohol 1.4 % ophthalmic solution Commonly known as: LIQUIFILM TEARS Place 1 drop into both eyes 4 (four) times daily as needed (for dry/irritated eyes).   potassium chloride 10 MEQ CR capsule Commonly known as: MICRO-K TAKE 2 CAPSULES(20 MEQ) BY MOUTH TWICE DAILY WHEN AS NEEDED WHEN TAKING A DOSE OF LASIX   promethazine 12.5 MG tablet Commonly known as: PHENERGAN   Semaglutide (1 MG/DOSE) 4 MG/3ML Sopn Inject 1 mg as directed once a week.   sodium chloride 0.65 % nasal spray Commonly known as: OCEAN Place 2 sprays into the nose as needed for congestion.   tiotropium 18 MCG inhalation capsule Commonly known as: SPIRIVA Place 18 mcg into inhaler and inhale as needed.   tiZANidine 2 MG tablet Commonly known as: ZANAFLEX TAKE 1 TABLET(2 MG) BY MOUTH THREE TIMES DAILY   Toujeo SoloStar 300 UNIT/ML Solostar Pen Generic drug: insulin glargine (1 Unit Dial) 45 units QAM and 40 units  QPM   triamterene-hydrochlorothiazide 37.5-25 MG tablet Commonly known as: MAXZIDE-25 TAKE 1 TABLET BY MOUTH DAILY   venlafaxine XR 75 MG 24 hr capsule Commonly known as: EFFEXOR-XR TAKE 1 CAPSULE BY MOUTH EVERY DAY WITH BREAKFAST   Vitamin D (Ergocalciferol) 1.25 MG (50000 UNIT) Caps capsule Commonly known as: DRISDOL Take 1 capsule (50,000 Units total)  by mouth every 7 (seven) days.        Review of Systems  Constitutional:  Negative for appetite change, chills, fatigue, fever and unexpected weight change.  HENT:  Positive for hearing loss. Negative for congestion, dental problem, ear discharge, ear pain, facial swelling, nosebleeds, postnasal drip, rhinorrhea, sinus pressure, sinus pain, sneezing, sore throat, tinnitus and trouble swallowing.   Eyes:  Negative for pain, discharge, redness, itching and visual disturbance.  Respiratory:  Negative for cough, chest tightness, shortness of breath and wheezing.   Cardiovascular:  Negative for chest pain, palpitations and leg swelling.  Gastrointestinal:  Negative for abdominal distention, abdominal pain, blood in stool, constipation, diarrhea, nausea and vomiting.  Endocrine: Negative for cold intolerance, heat intolerance, polydipsia, polyphagia and polyuria.  Genitourinary:  Negative for difficulty urinating, dysuria, flank pain, frequency and urgency.  Musculoskeletal:  Positive for arthralgias and gait problem. Negative for back pain, joint swelling, myalgias, neck pain and neck stiffness.       Rheumatoid arthritis worst on hand   Skin:  Negative for color change, pallor, rash and wound.       Breast and abdomen fold.    Neurological:  Negative for dizziness, syncope, speech difficulty, weakness, light-headedness, numbness and headaches.  Hematological:  Does not bruise/bleed easily.  Psychiatric/Behavioral:  Negative for agitation, behavioral problems, confusion, hallucinations, self-injury, sleep disturbance and suicidal ideas. The patient is not nervous/anxious.     Immunization History  Administered Date(s) Administered   Influenza-Unspecified 06/10/2011   PFIZER(Purple Top)SARS-COV-2 Vaccination 11/13/2019, 11/30/2019   Pneumococcal Conjugate-13 07/07/2016   Pneumococcal Polysaccharide-23 11/21/2003, 10/05/2017   Tdap 05/28/2016   Pertinent  Health Maintenance Due  Topic Date  Due   OPHTHALMOLOGY EXAM  03/25/2022   HEMOGLOBIN A1C  04/03/2023   FOOT EXAM  10/04/2023   DEXA SCAN  09/20/2025   INFLUENZA VACCINE  Discontinued      11/28/2022   11:21 AM 12/28/2022  10:52 AM 01/24/2023   12:53 PM 02/24/2023    9:31 AM 03/07/2023    8:36 AM  Fall Risk  Falls in the past year? 0 0 0 0 0  Was there an injury with Fall? 0  0  0  Fall Risk Category Calculator 0  0  0  Patient at Risk for Falls Due to    Impaired balance/gait History of fall(s);Impaired balance/gait  Fall risk Follow up     Falls evaluation completed   Functional Status Survey:    Vitals:   03/08/23 0859  BP: 128/62  Pulse: 68  Resp: 16  SpO2: 98%  Weight: 216 lb 12.8 oz (98.3 kg)  Height: 5\' 6"  (1.676 m)   Body mass index is 34.99 kg/m. Physical Exam Vitals reviewed.  Constitutional:      General: She is not in acute distress.    Appearance: Normal appearance. She is obese. She is not ill-appearing or diaphoretic.  HENT:     Head: Normocephalic.     Right Ear: Tympanic membrane, ear canal and external ear normal. There is no impacted cerumen.     Left Ear: Tympanic membrane, ear canal and external ear normal. There is no impacted cerumen.     Ears:     Comments: Right ear TM not visualized blue tube in place      Nose: Nose normal. No congestion or rhinorrhea.     Mouth/Throat:     Mouth: Mucous membranes are moist.     Pharynx: Oropharynx is clear. No oropharyngeal exudate or posterior oropharyngeal erythema.  Eyes:     General: No scleral icterus.       Right eye: No discharge.        Left eye: No discharge.     Extraocular Movements: Extraocular movements intact.     Conjunctiva/sclera: Conjunctivae normal.     Pupils: Pupils are equal, round, and reactive to light.  Neck:     Vascular: No carotid bruit.  Cardiovascular:     Rate and Rhythm: Normal rate and regular rhythm.     Pulses: Normal pulses.     Heart sounds: Normal heart sounds. No murmur heard.    No friction rub.  No gallop.  Pulmonary:     Effort: Pulmonary effort is normal. No respiratory distress.     Breath sounds: Normal breath sounds. No wheezing, rhonchi or rales.  Chest:     Chest wall: No tenderness.  Abdominal:     General: Bowel sounds are normal. There is no distension.     Palpations: Abdomen is soft. There is no mass.     Tenderness: There is no abdominal tenderness. There is no right CVA tenderness, left CVA tenderness, guarding or rebound.  Musculoskeletal:        General: No swelling or tenderness. Normal range of motion.     Cervical back: Normal range of motion. No rigidity or tenderness.     Right lower leg: No edema.     Left lower leg: No edema.  Lymphadenopathy:     Cervical: No cervical adenopathy.  Skin:    General: Skin is warm and dry.     Coloration: Skin is not pale.     Findings: Erythema present. No bruising, lesion or rash.     Comments: Bilateral breast and abdomen fold extensive beefy erythema with strong odor noted.   Neurological:     Mental Status: She is alert and oriented to person, place, and time.  Cranial Nerves: No cranial nerve deficit.     Sensory: Sensory deficit present.     Motor: No weakness.     Coordination: Coordination normal.     Gait: Gait abnormal.     Comments: Decreased monofilament sensation decreased on both feet   Psychiatric:        Mood and Affect: Mood normal.        Speech: Speech normal.        Behavior: Behavior normal.        Thought Content: Thought content normal.        Judgment: Judgment normal.     Labs reviewed: No results for input(s): "NA", "K", "CL", "CO2", "GLUCOSE", "BUN", "CREATININE", "CALCIUM", "MG", "PHOS" in the last 8760 hours. No results for input(s): "AST", "ALT", "ALKPHOS", "BILITOT", "PROT", "ALBUMIN" in the last 8760 hours. No results for input(s): "WBC", "NEUTROABS", "HGB", "HCT", "MCV", "PLT" in the last 8760 hours. Lab Results  Component Value Date   TSH 1.41 10/03/2022   Lab Results   Component Value Date   HGBA1C 6.7 (A) 10/03/2022   Lab Results  Component Value Date   CHOL 127 02/12/2021   HDL 17 (L) 02/12/2021   LDLCALC 76 02/12/2021   TRIG 259 (H) 02/12/2021   CHOLHDL 7.5 (H) 02/12/2021    Significant Diagnostic Results in last 30 days:  No results found.  Assessment/Plan  1. Post-herpetic polyneuropathy - continue on Gabapentin  - calamine lotion; Apply 1 Application topically 3 (three) times daily. To affected areas on right fore arm  Dispense: 120 mL; Refill: 0  2. Candidiasis, intertrigo Start on fluconazole  - ketoconazole (NIZORAL) 2 % cream; Apply 1 Application topically 2 (two) times daily.  Dispense: 60 g; Refill: 1 - fluconazole (DIFLUCAN) 150 MG tablet; Take 1 tablet (150 mg total) by mouth daily for 3 days.  Dispense: 3 tablet; Refill: 0  3. Candidal skin infection - bilateral breast and abdominal fold beefy erythema  - advised to keep skin dry - Nystatin cream as below  - nystatin (MYCOSTATIN/NYSTOP) powder; Apply 1 Application topically 3 (three) times daily.  Dispense: 30 g; Refill: 3 - nystatin cream (MYCOSTATIN); Apply 1 Application topically 3 (three) times daily. To skin folds  Dispense: 30 g; Refill: 3 - fluconazole (DIFLUCAN) 150 MG tablet; Take 1 tablet (150 mg total) by mouth daily for 3 days.  Dispense: 3 tablet; Refill: 0  4. Bilateral lower extremity edema Weight stable  Continue on Furosemide   5. Type 2 diabetes mellitus with diabetic polyneuropathy, with long-term current use of insulin (HCC) Lab Results  Component Value Date   HGBA1C 6.7 (A) 10/03/2022  - continue on semaglutide,Toujeo ,Lisipro and Humalog  - Lipid panel - COMPLETE METABOLIC PANEL WITH GFR - CBC with Differential/Platelet  6. Chronic obstructive pulmonary disease, unspecified COPD type (HCC) Breathing stable  - continue on Spiriva and Duoneb    7. Hoarseness Worsening hoarseness  - Ambulatory referral to ENT  8. Bilateral hearing loss,  unspecified hearing loss type Decreased hearing  - Ambulatory referral to ENT  9. Dysuria Negative exam will obtain  - POCT urinalysis dipstick - Urine Culture  10. PAD (peripheral artery disease) (HCC) bilateral feet decreased PD and monofilament sensation,cool to touch,Pink in color.right great toe and 5th toe recent blister area scab noted no erythema or drainage. - will schedule appointment with Podiatrist  Family/ staff Communication: Reviewed plan of care with patient verbalized understanding   Labs/tests ordered:  - POCT urinalysis dipstick - Urine  Culture - Lipid panel - COMPLETE METABOLIC PANEL WITH GFR - CBC with Differential/Platelet Next Appointment : Return in about 6 months (around 09/08/2023).   Caesar Bookman, NP

## 2023-03-09 LAB — COMPLETE METABOLIC PANEL WITH GFR
AG Ratio: 1.4 (calc) (ref 1.0–2.5)
ALT: 9 U/L (ref 6–29)
Albumin: 3.9 g/dL (ref 3.6–5.1)
Alkaline phosphatase (APISO): 86 U/L (ref 37–153)
CO2: 29 mmol/L (ref 20–32)
Calcium: 9.5 mg/dL (ref 8.6–10.4)
Creat: 0.98 mg/dL — ABNORMAL HIGH (ref 0.60–0.95)
Globulin: 2.8 g/dL (calc) (ref 1.9–3.7)
Potassium: 4.5 mmol/L (ref 3.5–5.3)
Sodium: 139 mmol/L (ref 135–146)
Total Bilirubin: 0.3 mg/dL (ref 0.2–1.2)
eGFR: 58 mL/min/{1.73_m2} — ABNORMAL LOW (ref >=60)

## 2023-03-09 LAB — CBC WITH DIFFERENTIAL/PLATELET
Absolute Monocytes: 1034 cells/uL — ABNORMAL HIGH (ref 200–950)
Basophils Relative: 0.9 %
HCT: 41.8 % (ref 35.0–45.0)
Lymphs Abs: 1673 cells/uL (ref 850–3900)
MCH: 27.9 pg (ref 27.0–33.0)
MCV: 84.4 fL (ref 80.0–100.0)
MPV: 9.9 fL (ref 7.5–12.5)
Total Lymphocyte: 12.3 %

## 2023-03-09 LAB — LIPID PANEL
Total CHOL/HDL Ratio: 3.9 (calc) (ref <5.0)
Triglycerides: 192 mg/dL — ABNORMAL HIGH (ref <150)

## 2023-03-09 NOTE — Progress Notes (Signed)
  Care Coordination  Outreach Note  03/09/2023 Name: MADILYNNE MULLAN MRN: [id number] DOB: [birth year]   Care Coordination Outreach Attempts: A second unsuccessful outreach was attempted today to offer the patient with information about available care coordination services.  Follow Up Plan:  Additional outreach attempts will be made to offer the patient care coordination information and services.   Encounter Outcome:  No Answer  Burman Nieves, CCMA Care Coordination Care Guide Direct Dial: [phone]

## 2023-03-10 NOTE — Progress Notes (Signed)
  Care Coordination  Outreach Note  03/10/2023 Name: Katherine Delgado MRN: [id number] DOB: [birth year]   Care Coordination Outreach Attempts: A third unsuccessful outreach was attempted today to offer the patient with information about available care coordination services.  Follow Up Plan:  No further outreach attempts will be made at this time. We have been unable to contact the patient to offer or enroll patient in care coordination services  Encounter Outcome:  No Answer  Burman Nieves, [facility] Care Coordination Care Guide Direct Dial: [phone]

## 2023-03-11 LAB — URINE CULTURE
MICRO NUMBER:: 15212544
SPECIMEN QUALITY:: ADEQUATE

## 2023-03-11 LAB — CBC WITH DIFFERENTIAL/PLATELET
Basophils Absolute: 122 cells/uL (ref 0–200)
Eosinophils Absolute: 585 cells/uL — ABNORMAL HIGH (ref 15–500)
Eosinophils Relative: 4.3 %
Hemoglobin: 13.8 g/dL (ref 11.7–15.5)
MCHC: 33 g/dL (ref 32.0–36.0)
Monocytes Relative: 7.6 %
Neutro Abs: 10186 cells/uL — ABNORMAL HIGH (ref 1500–7800)
Neutrophils Relative %: 74.9 %
Platelets: 346 10*3/uL (ref 140–400)
RBC: 4.95 10*6/uL (ref 3.80–5.10)
RDW: 12.7 % (ref 11.0–15.0)
WBC: 13.6 10*3/uL — ABNORMAL HIGH (ref 3.8–10.8)

## 2023-03-11 LAB — COMPLETE METABOLIC PANEL WITH GFR
AST: 11 U/L (ref 10–35)
BUN/Creatinine Ratio: 20 (calc) (ref 6–22)
BUN: 20 mg/dL (ref 7–25)
Chloride: 102 mmol/L (ref 98–110)
Glucose, Bld: 146 mg/dL — ABNORMAL HIGH (ref 65–99)
Total Protein: 6.7 g/dL (ref 6.1–8.1)

## 2023-03-11 LAB — LIPID PANEL
Cholesterol: 146 mg/dL (ref <200)
HDL: 37 mg/dL — ABNORMAL LOW (ref >=50)
LDL Cholesterol (Calc): 80 mg/dL (calc)
Non-HDL Cholesterol (Calc): 109 mg/dL (calc) (ref <130)

## 2023-03-12 DIAGNOSIS — I739 Peripheral vascular disease, unspecified: Secondary | ICD-10-CM | POA: Insufficient documentation

## 2023-03-13 ENCOUNTER — Other Ambulatory Visit: Payer: Self-pay

## 2023-03-13 MED ORDER — SACCHAROMYCES BOULARDII 250 MG PO CAPS: 250.0000 mg | ORAL_CAPSULE | Freq: Two times a day (BID) | ORAL | 0 refills | Status: AC

## 2023-03-13 MED ORDER — CIPROFLOXACIN HCL 500 MG PO TABS: 500.0000 mg | ORAL_TABLET | Freq: Two times a day (BID) | ORAL | 0 refills | Status: AC

## 2023-03-13 NOTE — Progress Notes (Deleted)
High Risk Warning Populated when attempting to refill, I will send to Provider for further review 

## 2023-03-13 NOTE — Telephone Encounter (Signed)
High Risk Warning Populated when attempting to refill, I will send to Provider for further review 

## 2023-03-15 NOTE — Progress Notes (Addendum)
Office Visit Note  Patient: Katherine Delgado             Date of Birth: [birth year]           MRN: [id number]             PCP: Caesar Bookman, NP Referring: Caesar Bookman, NP Visit Date: 03/27/2023 Occupation: @GUAROCC @  Subjective:  Out of xeljanz   History of Present Illness: Katherine Delgado is a 82 y.o. female with history of seropositive rheumatoid arthritis and sjogren's syndrome.  Patient has been prescribed Xeljanz 5 mg 1 tablet by mouth daily but has been out of her prescription for the past few months.  Patient states that she did not contact her office for refill and has not been seen since July 2023.  Patient states that she has been having more frequent flares since discontinuing Harriette Ohara and would like a refill sent to the pharmacy today.  She is having increased pain and stiffness in her shoulders and both hands.  She has intermittent swelling in her hands especially the right hand.  Patient has been using a walker or wheelchair to assist with her mobility.   Activities of Daily Living:  Patient reports morning stiffness for several hours.   Patient Reports nocturnal pain.  Difficulty dressing/grooming: Reports Difficulty climbing stairs: Reports Difficulty getting out of chair: Reports Difficulty using hands for taps, buttons, cutlery, and/or writing: Reports  Review of Systems  Constitutional:  Positive for fatigue.  HENT:  Positive for mouth dryness. Negative for mouth sores.   Eyes:  Negative for dryness.  Respiratory:         With exertion or activity   Cardiovascular:  Positive for palpitations.  Gastrointestinal:  Positive for constipation and diarrhea. Negative for blood in stool.  Endocrine: Positive for increased urination.  Genitourinary:  Positive for involuntary urination.  Musculoskeletal:  Positive for joint pain, gait problem, joint pain, joint swelling, myalgias, muscle weakness, morning stiffness, muscle tenderness and myalgias.  Skin:  Positive for  rash and hair loss. Negative for color change and sensitivity to sunlight.  Allergic/Immunologic: Negative for susceptible to infections.  Neurological:  Negative for dizziness and headaches.  Hematological:  Negative for swollen glands.  Psychiatric/Behavioral:  Positive for sleep disturbance. Negative for depressed mood. The patient is not nervous/anxious.     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   Primary osteoarthritis of right knee 07/25/2019   Wound dehiscence 08/17/2018   Lumbar spinal stenosis 03/30/2018   Rheumatoid arthritis involving both wrists with positive rheumatoid factor (HCC) 06/14/2016   High risk medication use 06/14/2016   Sjogren's syndrome (HCC) 06/14/2016   Primary osteoarthritis of both knees 06/14/2016   DDD (degenerative disc disease), lumbar 06/14/2016   Hypothyroidism 06/14/2016   Osteoporosis 06/14/2016   Abnormality of gait 04/19/2016   Type 2 diabetes mellitus with diabetic polyneuropathy, with long-term current use of insulin (HCC) 11/27/2015   Diabetes mellitus without complication (HCC) 09/12/2013   Headache(784.0) 07/31/2013   Sinus infection 07/31/2013   Chronic right-sided low back pain with right-sided sciatica 03/14/2013   Insomnia 12/06/2012   Nausea alone 12/06/2012   Diarrhea 12/06/2012   Urinary incontinence, urge 12/06/2012   Lumbosacral root  lesions, not elsewhere classified 11/27/2012   Diabetic polyneuropathy associated with type 2 diabetes mellitus (HCC) 11/27/2012   EDEMA 05/04/2010   YEAST INFECTION 05/03/2010   Hyperlipidemia 05/03/2010   DEPRESSION 05/03/2010   History of peripheral neuropathy 05/03/2010   Essential hypertension 05/03/2010    ALLERGIC RHINITIS 05/03/2010   PNEUMONIA 05/03/2010   COPD (chronic obstructive pulmonary disease) (HCC) 05/03/2010   GERD 05/03/2010   Fibromyalgia 05/03/2010   DYSPNEA 05/03/2010   CHEST PAIN 05/03/2010    Past Medical History:  Diagnosis Date   Abnormality of gait 04/19/2016   Acute infective polyneuritis (HCC) 2002   Allergic rhinitis due to pollen    Chronic pain syndrome    COPD (chronic obstructive pulmonary disease) (HCC)    chronic bronchitis   Depressive disorder, not elsewhere classified    Diabetes mellitus without complication (HCC)    Diaphragmatic hernia without mention of obstruction or gangrene    Dyslipidemia    Fibromyalgia    GERD (gastroesophageal reflux disease)    with h/o esophagitis   Guillain-Barre syndrome (HCC)    History of benign thymus tumor    Insomnia, unspecified    Lumbar spinal stenosis 03/30/2018   L4-5 level, severe   Miscarriage 1962   Mixed hyperlipidemia    Morbid obesity (HCC)    Osteoporosis    Pneumonia    Polyneuropathy in diabetes(357.2)    Rheumatoid arthritis(714.0)    Spondylosis, lumbosacral    Spontaneous ecchymoses    Type II or unspecified type diabetes mellitus with peripheral circulatory disorders, uncontrolled(250.72)    Unspecified chronic bronchitis (HCC)    Unspecified essential hypertension    Unspecified hypothyroidism    Unspecified pruritic disorder    Unspecified urinary incontinence     Family History  Problem Relation Age of Onset   Alzheimer's disease Mother    Heart disease Mother    Heart disease Father    Liver disease Father    Cancer Brother    Colon polyps Brother    Arthritis Son    Colon cancer Neg Hx    Esophageal cancer Neg Hx    Kidney disease Neg Hx    Stomach cancer Neg Hx    Rectal cancer Neg Hx    Past Surgical History:  Procedure Laterality Date   ABDOMINAL HYSTERECTOMY  1974   abdominal tumor  2002   APPENDECTOMY     APPLICATION OF A-CELL OF BACK N/A 09/26/2018   Procedure:  With Acell;  Surgeon: Peggye Form, DO;  Location: [facility];  Service: Plastics;  Laterality: N/A;   APPLICATION OF WOUND VAC N/A 09/26/2018   Procedure: Vac placement;  Surgeon: Peggye Form, DO;  Location: [facility];  Service: Plastics;  Laterality: N/A;   BACK SURGERY  1982   BRONCHOSCOPY  2001   CATARACT EXTRACTION, BILATERAL  09/2019, 10/2019   CHOLECYSTECTOMY  1984   INCISION AND DRAINAGE OF WOUND N/A 09/26/2018   Procedure: Debridement of spine wound;  Surgeon: Peggye Form, DO;  Location: [facility];  Service: Plastics;  Laterality: N/A;   KNEE SURGERY Bilateral 08/27/2010 (L) and 01/11/2011 (R)   LUMBAR LAMINECTOMY/DECOMPRESSION MICRODISCECTOMY N/A 07/03/2018   Procedure: Lumbar Three to Lumbar Five Laminectomy;  Surgeon: Jadene Pierini, MD;  Location: [facility];  Service: Neurosurgery;  Laterality: N/A;   LUMBAR WOUND DEBRIDEMENT N/A 08/20/2018   Procedure: POSTERIOR LUMBAR SPINAL WOUND DEBRIDEMENT AND REVISION;  Surgeon: Jadene Pierini, MD;  Location: [facility];  Service: Neurosurgery;  Laterality: N/A;  POSTERIOR LUMBAR SPINAL WOUND REVISION   miscarrage  1962   OVARIAN CYST SURGERY  1968   thymus tumor     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   TONSILLECTOMY     Social History   Social History Narrative   Walks with cane   Right handed    Caffeine use: Coffee (2 cups every morning)   Tea: sometimes   Soda: none   Immunization History  Administered Date(s) Administered   Influenza-Unspecified 06/10/2011   PFIZER(Purple Top)SARS-COV-2 Vaccination 11/13/2019, 11/30/2019   Pneumococcal Conjugate-13 07/07/2016   Pneumococcal Polysaccharide-23 11/21/2003, 10/05/2017   Tdap 05/28/2016     Objective: Vital Signs: BP 132/73 (BP Location: Left Arm, Patient Position: Sitting, Cuff Size: Normal)   Pulse 69   Resp 15   Ht 5\' 6"  (1.676 m)   Wt 213 lb (96.6 kg) Comment: per patient, patient in wheelchair  BMI 34.38 kg/m    Physical Exam Vitals and nursing note reviewed.   Constitutional:      Appearance: She is well-developed.  HENT:     Head: Normocephalic and atraumatic.  Eyes:     Conjunctiva/sclera: Conjunctivae normal.  Cardiovascular:     Rate and Rhythm: Normal rate and regular rhythm.     Heart sounds: Normal heart sounds.  Pulmonary:     Effort: Pulmonary effort is normal.     Breath sounds: Normal breath sounds.  Abdominal:     General: Bowel sounds are normal.     Palpations: Abdomen is soft.  Musculoskeletal:     Cervical back: Normal range of motion.  Lymphadenopathy:     Cervical: No cervical adenopathy.  Skin:    General: Skin is warm and dry.     Capillary Refill: Capillary refill takes less than 2 seconds.  Neurological:     Mental Status: She is alert and oriented to person, place, and time.  Psychiatric:        Behavior: Behavior normal.      Musculoskeletal Exam: Patient remained in wheelchair during the examination today. C-spine limited ROM with lateral rotation.  Painful ROM of both shoulders. Synovial thickening of both wrists.  Ulnar deviation noted bilaterally.  Hip joints difficult to assess in seated position. Knee joints have painful ROM but no warmth or effusion noted. Ankle joints have good ROM with no synovitis.    CDAI Exam: CDAI Score: -- Patient Global: 30 / 100; Provider Global: 30 / 100 Swollen: --; Tender: -- Joint Exam 03/27/2023   No joint exam has been documented for this visit   There is currently no information documented on the homunculus. Go to the Rheumatology activity and complete the homunculus joint exam.  Investigation: No additional findings.  Imaging: No results found.  Recent Labs: Lab Results  Component Value Date   WBC 13.6 (H) 03/08/2023   HGB 13.8 03/08/2023   PLT 346 03/08/2023   NA 139 03/08/2023   K 4.5 03/08/2023   CL 102 03/08/2023   CO2 29 03/08/2023   GLUCOSE 146 (H) 03/08/2023   BUN 20 03/08/2023   CREATININE 0.98 (H) 03/08/2023   BILITOT 0.3 03/08/2023    ALKPHOS 96 01/25/2021   AST 11 03/08/2023   ALT 9 03/08/2023   PROT 6.7 03/08/2023   ALBUMIN 3.5 01/25/2021   CALCIUM 9.5 03/08/2023   GFRAA 65 02/12/2021   QFTBGOLDPLUS NEGATIVE 09/30/2021    Speciality Comments: No specialty comments available.  Procedures:  No procedures performed Allergies: Influenza vaccines, Penicillins, Statins, and Sulfamethoxazole-trimethoprim   Assessment /  Plan:     Visit Diagnoses: Rheumatoid arthritis involving multiple sites with positive rheumatoid factor [facility]): Patient presents today experiencing increased pain and stiffness in both shoulders and both hands.  Synovial thickening of both wrist joints as well as synovial thickening of MCP joints with ulnar deviation noted bilaterally.  Patient has been experiencing intermittent flares most commonly affecting her hands.  She has been out of her prescription for Harriette Ohara for a few months.  Patient was last seen in the office in July 2023 and is overdue for TB Gold which was due in February 2024.  We will need to complete the prior authorization for Harriette Ohara as well as update TB Gold today.  A refill of Harriette Ohara will be sent to the pharmacy pending approval and TB Gold status.  Plan to have the patient follow-up every 3 months in order to prevent future gaps in therapy and to monitor her lab work closely.  High risk medication use - Xeljanz 5 mg 1 tablet by mouth daily. D/c Arava-SE of diarrhea, d/c MTX due to recurent infections.  CBC and CMP updated on 03/08/23.  Her next lab work will be due in October and every 3 months to monitor for drug toxicity. Lipid panel updated on 03/08/23.  TB Gold negative on 09/30/21. No recent or recurrent infections.  Discussed the importance of holding xeljanz if she develops signs or symptoms of an infection and to resume once the infection has completely cleared.  - Plan: QuantiFERON-TB Gold Plus  Screening for tuberculosis -Order for TB gold released today.  Plan: QuantiFERON-TB  Gold Plus  Sjogren's syndrome with keratoconjunctivitis sicca (HCC): Chronic mouth dryness.   Primary osteoarthritis of both knees: Chronic pain in both knee joints.  Good range of motion of both knees on examination today.  No warmth or effusion noted.  She either uses a walker or wheelchair to assist with her mobility.  DDD (degenerative disc disease), lumbar: Chronic pain.  Limited mobility.   Age-related osteoporosis without current pathological fracture: DEXA followed by PCP.  Other insomnia: Patient experiences nocturnal pain which interrupts her sleep at night.  Fibromyalgia: Patient has generalized hyperalgesia and positive tender points on exam.  She continues to experience intermittent myalgias and muscle tenderness consistent with fibromyalgia.  Other medical conditions are listed as follows:  Herpes zoster without complication  History of diabetes mellitus  History of Guillain-Barre syndrome  History of COPD  History of gastroesophageal reflux (GERD)  History of hypertension  History of depression  History of peripheral neuropathy    Orders: Orders Placed This Encounter  Procedures   QuantiFERON-TB Gold Plus   No orders of the defined types were placed in this encounter.    Follow-Up Instructions: Return in about 3 months (around 06/27/2023) for Rheumatoid arthritis, Sjogren's syndrome.   Gearldine Bienenstock, PA-C  Note - This record has been created using Dragon software.  Chart creation errors have been sought, but may not always  have been located. Such creation errors do not reflect on  the standard of medical care.

## 2023-03-17 ENCOUNTER — Other Ambulatory Visit: Payer: Self-pay | Admitting: Family

## 2023-03-17 NOTE — Telephone Encounter (Signed)
Patient has request refill on medications. Many high risk warnings, and allergy contraindications. Medication pend and sent to PCP Ngetich, Donalee Citrin, NP

## 2023-03-24 ENCOUNTER — Encounter: Payer: Self-pay | Admitting: Registered Nurse

## 2023-03-24 ENCOUNTER — Encounter: Payer: Medicare Other | Attending: Physical Medicine and Rehabilitation | Admitting: Registered Nurse

## 2023-03-24 VITALS — BP 129/69 | HR 61 | Ht 66.0 in | Wt 213.0 lb

## 2023-03-24 DIAGNOSIS — M545 Low back pain, unspecified: Secondary | ICD-10-CM | POA: Diagnosis not present

## 2023-03-24 DIAGNOSIS — M170 Bilateral primary osteoarthritis of knee: Secondary | ICD-10-CM | POA: Diagnosis not present

## 2023-03-24 DIAGNOSIS — M961 Postlaminectomy syndrome, not elsewhere classified: Secondary | ICD-10-CM | POA: Insufficient documentation

## 2023-03-24 DIAGNOSIS — M7061 Trochanteric bursitis, right hip: Secondary | ICD-10-CM | POA: Insufficient documentation

## 2023-03-24 DIAGNOSIS — Z5181 Encounter for therapeutic drug level monitoring: Secondary | ICD-10-CM | POA: Diagnosis not present

## 2023-03-24 DIAGNOSIS — M2550 Pain in unspecified joint: Secondary | ICD-10-CM | POA: Diagnosis not present

## 2023-03-24 DIAGNOSIS — M25511 Pain in right shoulder: Secondary | ICD-10-CM | POA: Insufficient documentation

## 2023-03-24 DIAGNOSIS — M25512 Pain in left shoulder: Secondary | ICD-10-CM | POA: Insufficient documentation

## 2023-03-24 DIAGNOSIS — G894 Chronic pain syndrome: Secondary | ICD-10-CM | POA: Insufficient documentation

## 2023-03-24 DIAGNOSIS — M7062 Trochanteric bursitis, left hip: Secondary | ICD-10-CM | POA: Insufficient documentation

## 2023-03-24 DIAGNOSIS — G8929 Other chronic pain: Secondary | ICD-10-CM | POA: Insufficient documentation

## 2023-03-24 DIAGNOSIS — M542 Cervicalgia: Secondary | ICD-10-CM | POA: Diagnosis not present

## 2023-03-24 DIAGNOSIS — Z79891 Long term (current) use of opiate analgesic: Secondary | ICD-10-CM | POA: Insufficient documentation

## 2023-03-24 DIAGNOSIS — M5412 Radiculopathy, cervical region: Secondary | ICD-10-CM | POA: Insufficient documentation

## 2023-03-24 MED ORDER — OXYCODONE-ACETAMINOPHEN 10-325 MG PO TABS: 1.0000 | ORAL_TABLET | ORAL | 0 refills | Status: DC | PRN

## 2023-03-24 NOTE — Progress Notes (Signed)
Subjective:    Patient ID: Katherine Delgado, female    DOB: 1940-09-24, 82 y.o.   MRN: [id number]  HPI: Katherine Delgado is a 82 y.o. female who returns for follow up appointment for chronic pain and medication refill. She states her pain is located in her neck radiating into her bilateral shoulders, lower back pain, bilateral hip pain and bilateral knee pain. She also reports bilateral hand pain ( Joint Pain). She rates her pain 9. Her current exercise regime is walking short distances in her home with walker and performing stretching exercises.  Ms. Pair Morphine equivalent is 90.00 MME.   Last Oral Swab was Performed on 10/31/2022, it was consistent.      Pain Inventory Average Pain 8 Pain Right Now 9 My pain is constant, sharp, burning, dull, stabbing, and aching  In the last 24 hours, has pain interfered with the following? General activity 10 Relation with others 10 Enjoyment of life 10 What TIME of day is your pain at its worst? morning  and night Sleep (in general) Poor  Pain is worse with: walking, bending, sitting, inactivity, standing, and some activites Pain improves with: rest and medication Relief from Meds: 5  Family History  Problem Relation Age of Onset   Alzheimer's disease Mother    Heart disease Mother    Heart disease Father    Liver disease Father    Cancer Brother    Arthritis Son    Colon polyps Brother    Colon cancer Neg Hx    Esophageal cancer Neg Hx    Kidney disease Neg Hx    Stomach cancer Neg Hx    Rectal cancer Neg Hx    Social History   Socioeconomic History   Marital status: Widowed    Spouse name: Not on file   Number of children: 2   Years of education: 14   Highest education level: Not on file  Occupational History   Occupation: Retired  Tobacco Use   Smoking status: Former    Current packs/day: 0.00    Average packs/day: 0.1 packs/day for 10.0 years (1.0 ttl pk-yrs)    Types: Cigarettes    Start date: 12/06/1969    Quit  date: 12/07/1979    Years since quitting: 43.3    Passive exposure: Current   Smokeless tobacco: Never  Vaping Use   Vaping status: Never Used  Substance and Sexual Activity   Alcohol use: No    Alcohol/week: 0.0 standard drinks of alcohol   Drug use: No   Sexual activity: Not Currently  Other Topics Concern   Not on file  Social History Narrative   Walks with cane   Right handed    Caffeine use: Coffee (2 cups every morning)   Tea: sometimes   Soda: none   Social Determinants of Health   Financial Resource Strain: Medium Risk (10/05/2017)   Overall Financial Resource Strain (CARDIA)    Difficulty of Paying Living Expenses: Somewhat hard  Food Insecurity: No Food Insecurity (10/25/2022)   Hunger Vital Sign    Worried About Running Out of Food in the Last Year: Never true    Ran Out of Food in the Last Year: Never true  Transportation Needs: No Transportation Needs (10/25/2022)   PRAPARE - Administrator, Civil Service (Medical): No    Lack of Transportation (Non-Medical): No  Physical Activity: Inactive (10/05/2017)   Exercise Vital Sign    Days of Exercise per Week: 0 days  Minutes of Exercise per Session: 0 min  Stress: Stress Concern Present (10/05/2017)   [employer] of Occupational Health - Occupational Stress Questionnaire    Feeling of Stress : Rather much  Social Connections: Moderately Isolated (10/05/2017)   Social Connection and Isolation Panel [NHANES]    Frequency of Communication with Friends and Family: More than three times a week    Frequency of Social Gatherings with Friends and Family: More than three times a week    Attends Religious Services: Never    Database administrator or Organizations: No    Attends Banker Meetings: Never    Marital Status: Widowed   Past Surgical History:  Procedure Laterality Date   ABDOMINAL HYSTERECTOMY  1974   abdominal tumor  2002   APPENDECTOMY     APPLICATION OF A-CELL OF BACK N/A  09/26/2018   Procedure: With Acell;  Surgeon: Peggye Form, DO;  Location: [facility];  Service: Plastics;  Laterality: N/A;   APPLICATION OF WOUND VAC N/A 09/26/2018   Procedure: Vac placement;  Surgeon: Peggye Form, DO;  Location: [facility];  Service: Plastics;  Laterality: N/A;   BACK SURGERY  1982   BRONCHOSCOPY  2001   CATARACT EXTRACTION, BILATERAL  09/2019, 10/2019   CHOLECYSTECTOMY  1984   INCISION AND DRAINAGE OF WOUND N/A 09/26/2018   Procedure: Debridement of spine wound;  Surgeon: Peggye Form, DO;  Location: [facility];  Service: Plastics;  Laterality: N/A;   KNEE SURGERY Bilateral 08/27/2010 (L) and 01/11/2011 (R)   LUMBAR LAMINECTOMY/DECOMPRESSION MICRODISCECTOMY N/A 07/03/2018   Procedure: Lumbar Three to Lumbar Five Laminectomy;  Surgeon: Jadene Pierini, MD;  Location: [facility];  Service: Neurosurgery;  Laterality: N/A;   LUMBAR WOUND DEBRIDEMENT N/A 08/20/2018   Procedure: POSTERIOR LUMBAR SPINAL WOUND DEBRIDEMENT AND REVISION;  Surgeon: Jadene Pierini, MD;  [facility]: [facility];  Service: Neurosurgery;  Laterality: N/A;  POSTERIOR LUMBAR SPINAL WOUND REVISION   miscarrage  1962   OVARIAN CYST SURGERY  1968   thymus tumor     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   TONSILLECTOMY     Past Surgical History:  Procedure Laterality Date   ABDOMINAL HYSTERECTOMY  1974   abdominal tumor  2002   APPENDECTOMY     APPLICATION OF A-CELL OF BACK N/A 09/26/2018   Procedure: With Acell;  Surgeon: Peggye Form, DO;  [facility];  Service: Plastics;  Laterality: N/A;   APPLICATION OF WOUND VAC N/A 09/26/2018   Procedure: Vac placement;  Surgeon: Peggye Form, DO;  [facility];  Service: Plastics;  Laterality: N/A;   BACK SURGERY  1982   BRONCHOSCOPY  2001   CATARACT EXTRACTION, BILATERAL  09/2019, 10/2019   CHOLECYSTECTOMY  1984   INCISION AND DRAINAGE OF WOUND N/A 09/26/2018   Procedure: Debridement of spine wound;  Surgeon: Peggye Form, DO;  [facility]: [facility];   Service: Plastics;  Laterality: N/A;   KNEE SURGERY Bilateral 08/27/2010 (L) and 01/11/2011 (R)   LUMBAR LAMINECTOMY/DECOMPRESSION MICRODISCECTOMY N/A 07/03/2018   Procedure: Lumbar Three to Lumbar Five Laminectomy;  Surgeon: Jadene Pierini, MD;  [facility];  Service: Neurosurgery;  Laterality: N/A;   LUMBAR WOUND DEBRIDEMENT N/A 08/20/2018   Procedure: POSTERIOR LUMBAR SPINAL WOUND DEBRIDEMENT AND REVISION;  Surgeon: Jadene Pierini, MD;  [facility];  Service: Neurosurgery;  Laterality: N/A;  POSTERIOR LUMBAR SPINAL WOUND REVISION   miscarrage  1962   OVARIAN CYST SURGERY  1968  thymus tumor     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   TONSILLECTOMY     Past Medical History:  Diagnosis Date   Abnormality of gait 04/19/2016   Acute infective polyneuritis (HCC) 2002   Allergic rhinitis due to pollen    Chronic pain syndrome    COPD (chronic obstructive pulmonary disease) (HCC)    chronic bronchitis   Depressive disorder, not elsewhere classified    Diabetes mellitus without complication (HCC)    Diaphragmatic hernia without mention of obstruction or gangrene    Dyslipidemia    Fibromyalgia    GERD (gastroesophageal reflux disease)    with h/o esophagitis   Guillain-Barre syndrome (HCC)    History of benign thymus tumor    Insomnia, unspecified    Lumbar spinal stenosis 03/30/2018   L4-5 level, severe   Miscarriage 1962   Mixed hyperlipidemia    Morbid obesity (HCC)    Osteoporosis    Pneumonia    Polyneuropathy in diabetes(357.2)    Rheumatoid arthritis(714.0)    Spondylosis, lumbosacral    Spontaneous ecchymoses    Type II or unspecified type diabetes mellitus with peripheral circulatory disorders, uncontrolled(250.72)    Unspecified chronic bronchitis (HCC)    Unspecified essential hypertension    Unspecified hypothyroidism    Unspecified pruritic disorder    Unspecified urinary incontinence    There were no vitals taken for this visit.  Opioid Risk Score:   Fall  Risk Score:  `1  Depression screen [facility] 2/9     03/07/2023    8:36 AM 02/24/2023    9:31 AM 01/24/2023   12:54 PM 12/28/2022   10:52 AM 11/28/2022   11:21 AM 09/01/2022   10:53 AM 06/16/2022    9:46 AM  Depression screen PHQ 2/9  Decreased Interest 0 0 0 0 0 0 0  Down, Depressed, Hopeless 0 0 0 0 0 0 0  PHQ - 2 Score 0 0 0 0 0 0 0    Review of Systems  Musculoskeletal:  Positive for arthralgias, back pain, gait problem and neck pain.       Pain in all major joints      Objective:   Physical Exam Vitals and nursing note reviewed.  Constitutional:      Appearance: Normal appearance.  Cardiovascular:     Rate and Rhythm: Normal rate and regular rhythm.     Pulses: Normal pulses.     Heart sounds: Normal heart sounds.  Pulmonary:     Effort: Pulmonary effort is normal.     Breath sounds: Normal breath sounds.  Musculoskeletal:     Cervical back: Normal range of motion and neck supple.     Comments: Normal Muscle Bulk and Muscle Testing Reveals:  Upper Extremities: Full ROM and Muscle Strength 5/5 Bilateral AC Joint Tenderness Lumbar Paraspinal Tenderness: L-4-L-5 Bilateral Greater Trochanter Tenderness Lower Extremities: Decreased ROM and Muscle Strength 5/5 Bilateral Lower Extremities Flexion Produces Pain into her Bilateral Patella's Arrived in wheelchair     Skin:    General: Skin is warm and dry.  Neurological:     Mental Status: She is alert and oriented to person, place, and time.  Psychiatric:        Mood and Affect: Mood normal.        Behavior: Behavior normal.         Assessment & Plan:  1.Failed Back Syndrome: Continue HEP as Tolerated. Continue to Monitor. 03/24/2023 2.Lumbar Radiculitis/ Chronic Low Back Pain without Sciatica: Continue Gabapentin. Continue  to monitor. Continue HEP as Tolerated 03/24/2023 3.Bilateral  Greater Trochanter Bursitis: .Continue to Alternate Ice and Heat Therapy. Continue current medication regimen. Continue to monitor.  03/24/2023 4.Bilateral Primary OA:/ Both Knees  Continue HEP as Tolerated. Continue to Monitor. 03/24/2023 5.Insomnia: Continue Amitriptyline . Continue to Monitor.03/24/2023 6.Chronic Pain Syndrome: Refilled: Oxycodone 10/325 mg one tablet every 4 hours as needed for pain #180. We will continue the opioid monitoring program, this consists of regular clinic visits, examinations, urine drug screen, pill counts as well as use of [place] Controlled Substance Reporting system. A 12 month History has been reviewed on the [place] Controlled Substance Reporting System on 03/24/2023 7. Cervicalgia/ Cervical Radiculitis: Continue Gabapentin. Continue HEP as tolerated. Continue to monitor. 03/24/2023 8. Polyarthralgia: Continue HEP as tolerated. Continue to monitor. 03/24/2023 9. Polyneuropathy: Continue Gabapentin. Continue to Monitor. 03/24/2023   F/U in 1 month

## 2023-03-27 ENCOUNTER — Ambulatory Visit: Payer: Medicare Other | Attending: Physician Assistant | Admitting: Physician Assistant

## 2023-03-27 ENCOUNTER — Encounter: Payer: Self-pay | Admitting: Physician Assistant

## 2023-03-27 ENCOUNTER — Telehealth: Payer: Self-pay | Admitting: Pharmacist

## 2023-03-27 ENCOUNTER — Other Ambulatory Visit (HOSPITAL_COMMUNITY): Payer: Self-pay

## 2023-03-27 VITALS — BP 132/73 | HR 69 | Resp 15 | Ht 66.0 in | Wt 213.0 lb

## 2023-03-27 DIAGNOSIS — Z8639 Personal history of other endocrine, nutritional and metabolic disease: Secondary | ICD-10-CM | POA: Diagnosis not present

## 2023-03-27 DIAGNOSIS — M0579 Rheumatoid arthritis with rheumatoid factor of multiple sites without organ or systems involvement: Secondary | ICD-10-CM | POA: Diagnosis not present

## 2023-03-27 DIAGNOSIS — M810 Age-related osteoporosis without current pathological fracture: Secondary | ICD-10-CM

## 2023-03-27 DIAGNOSIS — M3501 Sicca syndrome with keratoconjunctivitis: Secondary | ICD-10-CM | POA: Diagnosis not present

## 2023-03-27 DIAGNOSIS — G4709 Other insomnia: Secondary | ICD-10-CM | POA: Diagnosis not present

## 2023-03-27 DIAGNOSIS — M797 Fibromyalgia: Secondary | ICD-10-CM

## 2023-03-27 DIAGNOSIS — Z8709 Personal history of other diseases of the respiratory system: Secondary | ICD-10-CM

## 2023-03-27 DIAGNOSIS — M170 Bilateral primary osteoarthritis of knee: Secondary | ICD-10-CM

## 2023-03-27 DIAGNOSIS — Z8679 Personal history of other diseases of the circulatory system: Secondary | ICD-10-CM

## 2023-03-27 DIAGNOSIS — Z79899 Other long term (current) drug therapy: Secondary | ICD-10-CM

## 2023-03-27 DIAGNOSIS — B029 Zoster without complications: Secondary | ICD-10-CM | POA: Diagnosis not present

## 2023-03-27 DIAGNOSIS — M5136 Other intervertebral disc degeneration, lumbar region: Secondary | ICD-10-CM | POA: Diagnosis not present

## 2023-03-27 DIAGNOSIS — Z8659 Personal history of other mental and behavioral disorders: Secondary | ICD-10-CM

## 2023-03-27 DIAGNOSIS — Z8719 Personal history of other diseases of the digestive system: Secondary | ICD-10-CM

## 2023-03-27 DIAGNOSIS — Z8669 Personal history of other diseases of the nervous system and sense organs: Secondary | ICD-10-CM

## 2023-03-27 DIAGNOSIS — Z111 Encounter for screening for respiratory tuberculosis: Secondary | ICD-10-CM

## 2023-03-27 NOTE — Telephone Encounter (Signed)
Submitted a Prior Authorization request to [facility] for [facility] via CoverMyMeds. Will update once we receive a response.  Key: [id number]  [name], PharmD, MPH, BCPS, CPP Clinical Pharmacist (Rheumatology and Pulmonology)

## 2023-03-27 NOTE — Patient Instructions (Signed)
Standing Labs We placed an order today for your standing lab work.   Please have your standing labs drawn in October and every 3 months   Please have your labs drawn 2 weeks prior to your appointment so that the provider can discuss your lab results at your appointment, if possible.  Please note that you may see your imaging and lab results in MyChart before we have reviewed them. We will contact you once all results are reviewed. Please allow our office up to 72 hours to thoroughly review all of the results before contacting the office for clarification of your results.  WALK-IN LAB HOURS  Monday through Thursday from 8:00 am -12:30 pm and 1:00 pm-5:00 pm and Friday from 8:00 am-12:00 pm.  Patients with office visits requiring labs will be seen before walk-in labs.  You may encounter longer than normal wait times. Please allow additional time. Wait times may be shorter on  Monday and Thursday afternoons.  We do not book appointments for walk-in labs. We appreciate your patience and understanding with our staff.   Labs are drawn by [facility]. Please bring your co-pay at the time of your lab draw.  You may receive a bill from [facility] for your lab work.  Please note if you are on Hydroxychloroquine and and an order has been placed for a Hydroxychloroquine level,  you will need to have it drawn 4 hours or more after your last dose.  If you wish to have your labs drawn at another location, please call the office 24 hours in advance so we can fax the orders.  The office is located at [address], [address], , [place] [address]   If you have any questions regarding directions or hours of operation,  please call [phone].   As a reminder, please drink plenty of water prior to coming for your lab work. Thanks!  

## 2023-03-27 NOTE — Telephone Encounter (Signed)
Pending OV note from today, please renew Harriette Ohara PA  Patient's dose was 5mg  p.o. once daily  [name], PharmD, MPH, BCPS, CPP Clinical Pharmacist (Rheumatology and Pulmonology)

## 2023-03-27 NOTE — Telephone Encounter (Signed)
Received notification from [facility] regarding a prior authorization for [facility]. Authorization has been APPROVED from 03/27/23 to 08/22/23. Approval letter sent to scan center.  Per test claim, copay for 30 days supply is $11.20  Patient can fill through [facility]: [phone]   Authorization # [id number] Phone # [phone]  Rx to be sent pending updated TB gold today  [name], PharmD, MPH, BCPS, CPP Clinical Pharmacist (Rheumatology and Pulmonology)

## 2023-03-29 MED ORDER — XELJANZ 5 MG PO TABS
5.0000 mg | ORAL_TABLET | Freq: Every day | ORAL | 0 refills | Status: DC
Start: 2023-03-29 — End: 2023-06-25

## 2023-03-29 NOTE — Progress Notes (Signed)
TB gold negative.  Ok to refill Harriette Ohara

## 2023-03-29 NOTE — Telephone Encounter (Signed)
Rx for Katherine Delgado 5mg  once daily sent to [facility]  TB godl negative on 03/27/23. Left VM for patient to advise.  [name], PharmD, MPH, BCPS, CPP Clinical Pharmacist (Rheumatology and Pulmonology)

## 2023-03-30 DIAGNOSIS — E1142 Type 2 diabetes mellitus with diabetic polyneuropathy: Secondary | ICD-10-CM | POA: Diagnosis not present

## 2023-03-31 ENCOUNTER — Telehealth: Payer: Self-pay | Admitting: *Deleted

## 2023-03-31 MED ORDER — FLUCONAZOLE 150 MG PO TABS: 150.0000 mg | ORAL_TABLET | Freq: Every day | ORAL | 0 refills | Status: DC

## 2023-03-31 NOTE — Telephone Encounter (Signed)
Patient notified and agreed.  Confirmed dosage and amount with Dinah for Diflucan one daily for 7 days.

## 2023-03-31 NOTE — Telephone Encounter (Signed)
Patient called and stated that she has finished the Yeast Infection medication and Antibiotic for UTI and she is still having same symptoms, burning, frequency and itching.  Patient is requesting another round of medication to completely clear it up without having to come in.   Please Advise.

## 2023-03-31 NOTE — Telephone Encounter (Signed)
Refill diflucan 

## 2023-04-03 ENCOUNTER — Ambulatory Visit: Payer: Medicare Other | Admitting: Internal Medicine

## 2023-04-03 ENCOUNTER — Encounter: Payer: Self-pay | Admitting: Internal Medicine

## 2023-04-03 VITALS — BP 122/74 | HR 71 | Ht 66.0 in | Wt 211.0 lb

## 2023-04-03 DIAGNOSIS — E1142 Type 2 diabetes mellitus with diabetic polyneuropathy: Secondary | ICD-10-CM | POA: Diagnosis not present

## 2023-04-03 DIAGNOSIS — Z794 Long term (current) use of insulin: Secondary | ICD-10-CM | POA: Diagnosis not present

## 2023-04-03 DIAGNOSIS — E039 Hypothyroidism, unspecified: Secondary | ICD-10-CM | POA: Diagnosis not present

## 2023-04-03 LAB — POCT GLYCOSYLATED HEMOGLOBIN (HGB A1C): Hemoglobin A1C: 6.8 % — AB (ref 4.0–5.6)

## 2023-04-03 MED ORDER — HUMALOG KWIKPEN 200 UNIT/ML ~~LOC~~ SOPN: PEN_INJECTOR | SUBCUTANEOUS | 3 refills | Status: DC

## 2023-04-03 MED ORDER — SEMAGLUTIDE (2 MG/DOSE) 8 MG/3ML ~~LOC~~ SOPN: 2.0000 mg | PEN_INJECTOR | SUBCUTANEOUS | 3 refills | Status: DC

## 2023-04-03 MED ORDER — BD PEN NEEDLE MINI U/F 31G X 5 MM MISC: 1.0000 | Freq: Four times a day (QID) | 3 refills | Status: AC

## 2023-04-03 MED ORDER — DEXCOM G7 SENSOR MISC: 1.0000 | 3 refills | Status: DC

## 2023-04-03 MED ORDER — TOUJEO SOLOSTAR 300 UNIT/ML ~~LOC~~ SOPN: PEN_INJECTOR | SUBCUTANEOUS | 3 refills | Status: DC

## 2023-04-03 MED ORDER — LEVOTHYROXINE SODIUM 100 MCG PO TABS: 100.0000 ug | ORAL_TABLET | Freq: Every day | ORAL | 3 refills | Status: DC

## 2023-04-03 NOTE — Progress Notes (Signed)
Name: Katherine Delgado  Age/ Sex: 82 y.o., female   MRN/ DOB: [id number], 11/16/1940     PCP: Katherine Bookman, NP   Reason for Endocrinology Evaluation: Type 2 Diabetes Mellitus/ hypothyriodism  Initial Endocrine Consultative Visit: 10/14/2020    PATIENT IDENTIFIER: [name] is a 82 y.o. female with a past medical history of HTN, DM, RA, failed back syndrome and Hypothyroidism.. The patient has followed with Endocrinology clinic since 10/14/2020 for consultative assistance with management of her diabetes.      DIABETIC HISTORY:  Katherine Delgado was diagnosed with DM many years ago. Her hemoglobin A1c has ranged from Her A1c was ranged from 6.6% in 2019 to 10.3 % in 2022.     On her initial visit to our clinic her A1c was 10.3% .We continued MDI regimen , victoza and started [place] and was provided with a correction scale . A dexcom prescription was sent   [name] was discontinued due to severe genital irritation    Switch Victoza to Ozempic 02/2022 THYROID HISTORY: She has been on LT-4 replacement for years. Was noted to have a consistently low TSH since 05/2020. Per pt has been diagnosed with MNG in the past. No prior sx or biopsies. She was  On Levothyroxine 125 mcg daily which we reduced to 112 mcg daily   SUBJECTIVE:   During the last visit (03/02/2022): A1c 6.4 %   Today (04/03/2023): Katherine Delgado is here for a follow up on diabetes and hypothyroidism . She checks her blood sugars multiple times a day through CGM. The patient has had hypoglycemic episodes since the last clinic visit, this is noted during the day following a bolus , she is symptomatic.    Sees rheumatology  for RA Has occasional local neck swelling  Denies nausea or vomiting  Has occasional constipation    HOME ENDOCRINE REGIMEN:  Toujeo 45 units QAM and 40 units QPM Humalog 6 units with each meal  Ozempic 1 mg weekly  CF: Humalog ( BG- 140/30)  Levothyroxine 100 mcg daily     CONTINUOUS  GLUCOSE MONITORING RECORD INTERPRETATION    Dates of Recording: 7/30-8/07/2023  Sensor description:dexcom  Results statistics:   CGM use % of time 50  Average and SD 151/41  Time in range 73%  % Time Above 180 24  % Time above 250 0  % Time Below target < 3      Glycemic patterns summary: BG's are optimal during the day and night Hyperglycemic episodes  postprandial   Hypoglycemic episodes occurred sporadically following a bolus   Overnight periods: Variable   DIABETIC COMPLICATIONS: Microvascular complications:  neuropathy Denies: CKD, retinopathy Last Eye Exam: Completed 03/25/2021  Macrovascular complications:   Denies: CAD, CVA, PVD   HISTORY:  Past Medical History:  Past Medical History:  Diagnosis Date   Abnormality of gait 04/19/2016   Acute infective polyneuritis (HCC) 2002   Allergic rhinitis due to pollen    Chronic pain syndrome    COPD (chronic obstructive pulmonary disease) (HCC)    chronic bronchitis   Depressive disorder, not elsewhere classified    Diabetes mellitus without complication (HCC)    Diaphragmatic hernia without mention of obstruction or gangrene    Dyslipidemia    Fibromyalgia    GERD (gastroesophageal reflux disease)    with h/o esophagitis   Guillain-Barre syndrome (HCC)    History of benign thymus tumor    Insomnia, unspecified    Lumbar spinal stenosis 03/30/2018   L4-5  level, severe   Miscarriage 1962   Mixed hyperlipidemia    Morbid obesity (HCC)    Osteoporosis    Pneumonia    Polyneuropathy in diabetes(357.2)    Rheumatoid arthritis(714.0)    Spondylosis, lumbosacral    Spontaneous ecchymoses    Type II or unspecified type diabetes mellitus with peripheral circulatory disorders, uncontrolled(250.72)    Unspecified chronic bronchitis (HCC)    Unspecified essential hypertension    Unspecified hypothyroidism    Unspecified pruritic disorder    Unspecified urinary incontinence    Past Surgical History:  Past  Surgical History:  Procedure Laterality Date   ABDOMINAL HYSTERECTOMY  1974   abdominal tumor  2002   APPENDECTOMY     APPLICATION OF A-CELL OF BACK N/A 09/26/2018   Procedure: With Acell;  Surgeon: Peggye Form, DO;  Location: [facility] OR;  Service: Plastics;  Laterality: N/A;   APPLICATION OF WOUND VAC N/A 09/26/2018   Procedure: Vac placement;  Surgeon: Peggye Form, DO;  Location: [facility] OR;  Service: Plastics;  Laterality: N/A;   BACK SURGERY  1982   BRONCHOSCOPY  2001   CATARACT EXTRACTION, BILATERAL  09/2019, 10/2019   CHOLECYSTECTOMY  1984   INCISION AND DRAINAGE OF WOUND N/A 09/26/2018   Procedure: Debridement of spine wound;  Surgeon: Peggye Form, DO;  Location: [facility] OR;  Service: Plastics;  Laterality: N/A;   KNEE SURGERY Bilateral 08/27/2010 (L) and 01/11/2011 (R)   LUMBAR LAMINECTOMY/DECOMPRESSION MICRODISCECTOMY N/A 07/03/2018   Procedure: Lumbar Three to Lumbar Five Laminectomy;  Surgeon: Jadene Pierini, MD;  Location: [facility] OR;  Service: Neurosurgery;  Laterality: N/A;   LUMBAR WOUND DEBRIDEMENT N/A 08/20/2018   Procedure: POSTERIOR LUMBAR SPINAL WOUND DEBRIDEMENT AND REVISION;  Surgeon: Jadene Pierini, MD;  Location: [facility] OR;  Service: Neurosurgery;  Laterality: N/A;  POSTERIOR LUMBAR SPINAL WOUND REVISION   miscarrage  1962   OVARIAN CYST SURGERY  1968   thymus tumor     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   TONSILLECTOMY     Social History:  reports that she quit smoking about 43 years ago. Her smoking use included cigarettes. She started smoking about 53 years ago. She has a 1 pack-year smoking history. She has been exposed to tobacco smoke. She has never used smokeless tobacco. She reports that she does not drink alcohol and does not use drugs. Family History:  Family History  Problem Relation Age of Onset   Alzheimer's disease Mother    Heart disease Mother    Heart disease Father    Liver disease Father    Cancer Brother    Colon polyps Brother    Arthritis Son     Colon cancer Neg Hx    Esophageal cancer Neg Hx    Kidney disease Neg Hx    Stomach cancer Neg Hx    Rectal cancer Neg Hx      HOME MEDICATIONS: Allergies as of 04/03/2023       Reactions   Influenza Vaccines Other (See Comments)   "h/o Guillain Barre; dr's told me years ago never to take another flu shot as it could relapse Alene Mires; has to do with when vaccine being changed to H1N1 virus"   Penicillins Hives, Itching, Swelling, Rash   Has patient had a PCN reaction causing immediate rash, facial/tongue/throat swelling, SOB or lightheadedness with hypotension:Unknown Has patient had a PCN reaction causing severe rash involving mucus membranes or skin necrosis: Unknown Has patient had a PCN reaction that required hospitalization:Unknown  Has patient had a PCN reaction occurring within the last 10 years: Unknown If all of the above answers are "NO", then may proceed with Cephalosporin use.   Statins Other (See Comments)   Myopathy, transaminitis   Sulfamethoxazole-trimethoprim Itching        Medication List        Accurate as of April 03, 2023  8:58 AM. If you have any questions, ask your nurse or doctor.          amitriptyline 50 MG tablet Commonly known as: ELAVIL Take 1 tablet (50 mg total) by mouth at bedtime.   amLODipine 10 MG tablet Commonly known as: NORVASC TAKE 1 TABLET(10 MG) BY MOUTH DAILY   aspirin EC 81 MG tablet Take 81 mg by mouth 2 (two) times a week.   augmented betamethasone dipropionate 0.05 % cream Commonly known as: DIPROLENE-AF Apply topically 2 (two) times daily as needed.   B-D UF III MINI PEN NEEDLES 31G X 5 MM Misc Generic drug: Insulin Pen Needle 1 Device by Other route in the morning, at noon, in the evening, and at bedtime.   calamine lotion Apply 1 Application topically 3 (three) times daily. To affected areas on right fore arm What changed:  when to take this reasons to take this   cholecalciferol 25 MCG (1000 UNIT)  tablet Commonly known as: VITAMIN D3 Take 1,000 Units by mouth daily.   Cranberry 475 MG Caps Take 1 capsule (475 mg total) by mouth 2 (two) times daily.   docusate sodium 100 MG capsule Commonly known as: COLACE Take 100 mg by mouth daily as needed for mild constipation.   DULoxetine 20 MG capsule Commonly known as: CYMBALTA TAKE 1 CAPSULE(20 MG) BY MOUTH DAILY   ezetimibe 10 MG tablet Commonly known as: ZETIA Take 1 tablet (10 mg total) by mouth daily.   fluconazole 150 MG tablet Commonly known as: DIFLUCAN Take 1 tablet (150 mg total) by mouth daily.   fluticasone 0.05 % cream Commonly known as: CUTIVATE Apply topically 2 (two) times daily as needed.   fluticasone 50 MCG/ACT nasal spray Commonly known as: FLONASE SHAKE LIQUID AND USE 2 SPRAYS IN EACH NOSTRIL AT BEDTIME   folic acid 1 MG tablet Commonly known as: FOLVITE Take 2 tablets (2 mg total) by mouth daily.   furosemide 20 MG tablet Commonly known as: LASIX TAKE 1 BY MOUTH TWICE DAILY FOR 3 DAYS AS NEEDED FOR 3 POUND WEIGHT GAIN IN ONE DAY OR 5 POUNDS IN ONE WEEK   gabapentin 600 MG tablet Commonly known as: NEURONTIN TAKE 1 TABLET(600 MG) BY MOUTH THREE TIMES DAILY   HumaLOG KwikPen 200 UNIT/ML KwikPen Generic drug: insulin lispro Inject into the skin.   ICY HOT MEDICATED SPRAY EX Apply 1 application topically as needed (shoulder pain).   ipratropium-albuterol 0.5-2.5 (3) MG/3ML Soln Commonly known as: DUONEB Take 3 mLs by nebulization every 6 (six) hours as needed.   ketoconazole 2 % cream Commonly known as: NIZORAL Apply 1 Application topically 2 (two) times daily.   levothyroxine 100 MCG tablet Commonly known as: SYNTHROID TAKE 1 TABLET(100 MCG) BY MOUTH DAILY   lidocaine 5 % Commonly known as: LIDODERM PLACE 1 PATCH ONTO THE SKIN DAILY AS DIRECTED, REMOVE AND DISCARD PATCH WITHN 12 HOURS OR AS DIRECTED BY MD   lisinopril 20 MG tablet Commonly known as: ZESTRIL TAKE 1 TABLET BY MOUTH  EVERY DAY   loperamide 2 MG tablet Commonly known as: IMODIUM A-D Take 2 mg by  mouth 4 (four) times daily as needed for diarrhea or loose stools.   magnesium oxide 400 MG tablet Commonly known as: MAG-OX Take by mouth.   Melatonin 10 MG Tabs Take 10 mg by mouth at bedtime as needed (sleep).   metoprolol tartrate 50 MG tablet Commonly known as: LOPRESSOR TAKE 1 TABLET BY MOUTH TWICE DAILY   MULTIVITAMIN ADULT PO Take 1 tablet by mouth daily.   Myrbetriq 50 MG Tb24 tablet Generic drug: mirabegron ER Take 50 mg by mouth daily.   nystatin powder Commonly known as: MYCOSTATIN/NYSTOP Apply 1 Application topically 3 (three) times daily.   nystatin cream Commonly known as: MYCOSTATIN Apply 1 Application topically 3 (three) times daily. To skin folds   omega-3 acid ethyl esters 1 g capsule Commonly known as: LOVAZA TAKE ONE CAPSULE BY MOUTH EVERY DAY   ondansetron 4 MG tablet Commonly known as: Zofran Take 1 tablet (4 mg total) by mouth every 8 (eight) hours as needed for nausea or vomiting.   oxyCODONE-acetaminophen 10-325 MG tablet Commonly known as: Percocet Take 1 tablet by mouth every 4 (four) hours as needed for pain.   polyvinyl alcohol 1.4 % ophthalmic solution Commonly known as: LIQUIFILM TEARS Place 1 drop into both eyes 4 (four) times daily as needed (for dry/irritated eyes).   potassium chloride 10 MEQ CR capsule Commonly known as: MICRO-K TAKE 2 CAPSULES(20 MEQ) BY MOUTH TWICE DAILY WHEN AS NEEDED WHEN TAKING A DOSE OF LASIX   promethazine 12.5 MG tablet Commonly known as: PHENERGAN as needed.   Semaglutide (1 MG/DOSE) 4 MG/3ML Sopn Inject 1 mg as directed once a week.   sodium chloride 0.65 % nasal spray Commonly known as: OCEAN Place 2 sprays into the nose as needed for congestion.   tiotropium 18 MCG inhalation capsule Commonly known as: SPIRIVA Place 18 mcg into inhaler and inhale as needed.   tiZANidine 2 MG tablet Commonly known as:  ZANAFLEX TAKE 1 TABLET(2 MG) BY MOUTH THREE TIMES DAILY   Toujeo SoloStar 300 UNIT/ML Solostar Pen Generic drug: insulin glargine (1 Unit Dial) 45 units QAM and 40 units  QPM   triamterene-hydrochlorothiazide 37.5-25 MG tablet Commonly known as: MAXZIDE-25 TAKE 1 TABLET BY MOUTH DAILY   venlafaxine XR 75 MG 24 hr capsule Commonly known as: EFFEXOR-XR TAKE 1 CAPSULE BY MOUTH EVERY DAY WITH BREAKFAST   Vitamin D (Ergocalciferol) 1.25 MG (50000 UNIT) Caps capsule Commonly known as: DRISDOL Take 1 capsule (50,000 Units total) by mouth every 7 (seven) days.   Xeljanz 5 MG Tabs Generic drug: Tofacitinib Citrate Take 1 tablet (5 mg total) by mouth daily.         OBJECTIVE:   Vital Signs: BP 122/74 (BP Location: Left Arm, Patient Position: Sitting, Cuff Size: Large)   Pulse 71   Ht 5\' 6"  (1.676 m)   Wt 211 lb (95.7 kg)   SpO2 99%   BMI 34.06 kg/m   Wt Readings from Last 3 Encounters:  04/03/23 211 lb (95.7 kg)  03/27/23 213 lb (96.6 kg)  03/24/23 213 lb (96.6 kg)     Exam: General: Pt appears well and is in NAD  Lungs: Clear with good BS bilat   Heart: RRR   Abdomen: soft, nontender  Extremities: No pretibial edema.   Neuro: MS is good with appropriate affect, pt is alert and Ox3    DM foot exam:10/03/2022  The skin of the feet is without sores or ulcerations, toe nailed thickened, left pre-ulcerative callus at the left  1st MTH The pedal pulses are 1+ on right and 1+ on left. The sensation is decreased  to a screening 5.07, 10 gram monofilament on the right     DATA REVIEWED:  Lab Results  Component Value Date   HGBA1C 6.8 (A) 04/03/2023   HGBA1C 6.7 (A) 10/03/2022   HGBA1C 6.4 (A) 03/02/2022    Latest Reference Range & Units 03/08/23 10:07  Sodium 135 - 146 mmol/L 139  Potassium 3.5 - 5.3 mmol/L 4.5  Chloride 98 - 110 mmol/L 102  CO2 20 - 32 mmol/L 29  Glucose 65 - 99 mg/dL 161 (H)  BUN 7 - 25 mg/dL 20  Creatinine 0.96 - 0.45 mg/dL 4.09 (H)   Calcium 8.6 - 10.4 mg/dL 9.5  BUN/Creatinine Ratio 6 - 22 (calc) 20  eGFR > OR = 60 mL/min/1.36m2 58 (L)  AG Ratio 1.0 - 2.5 (calc) 1.4  AST 10 - 35 U/L 11  ALT 6 - 29 U/L 9  Total Protein 6.1 - 8.1 g/dL 6.7    Latest Reference Range & Units 03/08/23 10:07  Total CHOL/HDL Ratio <5.0 (calc) 3.9  Cholesterol <200 mg/dL 811  HDL Cholesterol > OR = 50 mg/dL 37 (L)  LDL Cholesterol (Calc) mg/dL (calc) 80  Non-HDL Cholesterol (Calc) <130 mg/dL (calc) 914  Triglycerides <150 mg/dL 782 (H)  (L): Data is abnormally low (H): Data is abnormally high    ASSESSMENT / PLAN / RECOMMENDATIONS:   1) Type 2 Diabetes Mellitus, With  Neuropathic complications - Most recent A1c of 6.8 %. Goal A1c < 7.5 %.    -A1c optimal - Intolerant to SGLT-2 inhibitors due to severe genital irritations  -Will increase Ozempic to help with more weight loss, I will decrease her basal/prandial insulin preemptively to prevent hypoglycemia as below     MEDICATIONS:  - Increase  Ozempic 2 mg weekly  - Decrease Toujeo 40 units QAM and 40 units  QPM - Decrease  Humalog 4 units with each meal  - Change CF : Humalog ( BG - 130/35 )    EDUCATION / INSTRUCTIONS: BG monitoring instructions: Patient is instructed to check her blood sugars 3 times a day, before meals . Call [place] [facility] clinic if: BG persistently < 70  I reviewed the Rule of 15 for the treatment of hypoglycemia in detail with the patient. Literature supplied.   2) Diabetic complications:  Eye: Does not have known diabetic retinopathy.  Neuro/ Feet: Does  have known diabetic peripheral neuropathy .  Renal: Patient does not have known baseline CKD.    3) Hypothyroidism:    -TSH is normal   Medication  levothyroxine 100 mcg daily    F/U in 6 months     Signed electronically by: Lyndle Herrlich, MD  [facility]  [facility] [address] [place] [place], [place] [address] Phone:  [phone] FAX: [phone]   CC: Katherine Bookman, NP [address] [place] [address] Phone: [phone]  Fax: [phone]  Return to Endocrinology clinic as below: Future Appointments  Date Time Provider Department Center  04/21/2023  8:20 AM Jones Bales, NP [facility] CPR  05/23/2023  8:20 AM Jones Bales, NP [facility] CPR  06/27/2023  8:30 AM Gearldine Bienenstock, PA-C [facility] None  03/08/2024  9:00 AM Ngetich, Donalee Citrin, NP [facility] None

## 2023-04-03 NOTE — Patient Instructions (Addendum)
-   Increase Ozempic 2 mg once weekly  - Decrease  Toujeo 40  units every morning and 40 units every evening  - Humalog correctional insulin: Use the scale below to help guide you BEFORE each meal  Blood sugar before meal Number of units to inject  80- 165 4 units  166- 200 5 units   201 - 235 6 units  236 - 270 7 units   271 - 305 8 units          HOW TO TREAT LOW BLOOD SUGARS (Blood sugar LESS THAN 70 MG/DL) Please follow the RULE OF 15 for the treatment of hypoglycemia treatment (when your (blood sugars are less than 70 mg/dL)   STEP 1: Take 15 grams of carbohydrates when your blood sugar is low, which includes:  3-4 GLUCOSE TABS  OR 3-4 OZ OF JUICE OR REGULAR SODA OR ONE TUBE OF GLUCOSE GEL    STEP 2: RECHECK blood sugar in 15 MINUTES STEP 3: If your blood sugar is still low at the 15 minute recheck --> then, go back to STEP 1 and treat AGAIN with another 15 grams of carbohydrates.

## 2023-04-04 ENCOUNTER — Encounter: Payer: Self-pay | Admitting: Internal Medicine

## 2023-04-21 ENCOUNTER — Encounter: Payer: Medicare Other | Admitting: Registered Nurse

## 2023-04-21 ENCOUNTER — Encounter: Payer: Self-pay | Admitting: Registered Nurse

## 2023-04-21 VITALS — BP 138/72 | HR 66 | Ht 66.0 in | Wt 206.0 lb

## 2023-04-21 DIAGNOSIS — G894 Chronic pain syndrome: Secondary | ICD-10-CM

## 2023-04-21 DIAGNOSIS — Z5181 Encounter for therapeutic drug level monitoring: Secondary | ICD-10-CM

## 2023-04-21 DIAGNOSIS — Z79891 Long term (current) use of opiate analgesic: Secondary | ICD-10-CM | POA: Diagnosis not present

## 2023-04-21 DIAGNOSIS — M5412 Radiculopathy, cervical region: Secondary | ICD-10-CM | POA: Diagnosis not present

## 2023-04-21 DIAGNOSIS — M170 Bilateral primary osteoarthritis of knee: Secondary | ICD-10-CM | POA: Diagnosis not present

## 2023-04-21 DIAGNOSIS — M7062 Trochanteric bursitis, left hip: Secondary | ICD-10-CM | POA: Diagnosis not present

## 2023-04-21 DIAGNOSIS — M25511 Pain in right shoulder: Secondary | ICD-10-CM

## 2023-04-21 DIAGNOSIS — M542 Cervicalgia: Secondary | ICD-10-CM

## 2023-04-21 DIAGNOSIS — M2550 Pain in unspecified joint: Secondary | ICD-10-CM

## 2023-04-21 DIAGNOSIS — G8929 Other chronic pain: Secondary | ICD-10-CM | POA: Diagnosis not present

## 2023-04-21 DIAGNOSIS — M545 Low back pain, unspecified: Secondary | ICD-10-CM | POA: Diagnosis not present

## 2023-04-21 DIAGNOSIS — M961 Postlaminectomy syndrome, not elsewhere classified: Secondary | ICD-10-CM

## 2023-04-21 DIAGNOSIS — M7061 Trochanteric bursitis, right hip: Secondary | ICD-10-CM

## 2023-04-21 DIAGNOSIS — M25512 Pain in left shoulder: Secondary | ICD-10-CM | POA: Diagnosis not present

## 2023-04-21 MED ORDER — OXYCODONE-ACETAMINOPHEN 10-325 MG PO TABS: 1.0000 | ORAL_TABLET | ORAL | 0 refills | Status: DC | PRN

## 2023-04-21 NOTE — Progress Notes (Signed)
Subjective:    Patient ID: Katherine Delgado, female    DOB: 1941/06/24, 82 y.o.   MRN: [id number]  HPI: Katherine Delgado is a 82 y.o. female who returns for follow up appointment for chronic pain and medication refill. She states her pain is located in her neck radiating into her bilateral shoulders, lower back pain, bilateral hip pain and bilateral knee pain. Also reports generalized joint pain. She rates her pain 8. Her current exercise regime is walking short distances with her walker when she is in her home and performing stretching exercises.  [name] Morphine equivalent is 90.00 MME.   Oral Swab was Performed today.      Pain Inventory Average Pain 7 Pain Right Now 8 My pain is constant, sharp, burning, dull, stabbing, and aching  In the last 24 hours, has pain interfered with the following? General activity 10 Relation with others 10 Enjoyment of life 10 What TIME of day is your pain at its worst? night, morning Sleep (in general) Poor  Pain is worse with: walking, bending, sitting, and standing Pain improves with: therapy/exercise, pacing activities, and medication Relief from Meds: 7  Family History  Problem Relation Age of Onset   Alzheimer's disease Mother    Heart disease Mother    Heart disease Father    Liver disease Father    Cancer Brother    Colon polyps Brother    Arthritis Son    Colon cancer Neg Hx    Esophageal cancer Neg Hx    Kidney disease Neg Hx    Stomach cancer Neg Hx    Rectal cancer Neg Hx    Social History   Socioeconomic History   Marital status: Widowed    Spouse name: Not on file   Number of children: 2   Years of education: 14   Highest education level: Not on file  Occupational History   Occupation: Retired  Tobacco Use   Smoking status: Former    Current packs/day: 0.00    Average packs/day: 0.1 packs/day for 10.0 years (1.0 ttl pk-yrs)    Types: Cigarettes    Start date: 12/06/1969    Quit date: 12/07/1979    Years since  quitting: 43.4    Passive exposure: Current   Smokeless tobacco: Never  Vaping Use   Vaping status: Never Used  Substance and Sexual Activity   Alcohol use: No    Alcohol/week: 0.0 standard drinks of alcohol   Drug use: No   Sexual activity: Not Currently  Other Topics Concern   Not on file  Social History Narrative   Walks with cane   Right handed    Caffeine use: Coffee (2 cups every morning)   Tea: sometimes   Soda: none   Social Determinants of Health   Financial Resource Strain: Medium Risk (10/05/2017)   Overall Financial Resource Strain (CARDIA)    Difficulty of Paying Living Expenses: Somewhat hard  Food Insecurity: No Food Insecurity (10/25/2022)   Hunger Vital Sign    Worried About Running Out of Food in the Last Year: Never true    Ran Out of Food in the Last Year: Never true  Transportation Needs: No Transportation Needs (10/25/2022)   PRAPARE - Administrator, Civil Service (Medical): No    Lack of Transportation (Non-Medical): No  Physical Activity: Inactive (10/05/2017)   Exercise Vital Sign    Days of Exercise per Week: 0 days    Minutes of Exercise per Session:  0 min  Stress: Stress Concern Present (10/05/2017)   [employer] of Occupational Health - Occupational Stress Questionnaire    Feeling of Stress : Rather much  Social Connections: Moderately Isolated (10/05/2017)   Social Connection and Isolation Panel [NHANES]    Frequency of Communication with Friends and Family: More than three times a week    Frequency of Social Gatherings with Friends and Family: More than three times a week    Attends Religious Services: Never    Database administrator or Organizations: No    Attends Banker Meetings: Never    Marital Status: Widowed   Past Surgical History:  Procedure Laterality Date   ABDOMINAL HYSTERECTOMY  1974   abdominal tumor  2002   APPENDECTOMY     APPLICATION OF A-CELL OF BACK N/A 09/26/2018   Procedure: With Acell;   Surgeon: Peggye Form, DO;  [facility];  Service: Plastics;  Laterality: N/A;   APPLICATION OF WOUND VAC N/A 09/26/2018   Procedure: Vac placement;  Surgeon: Peggye Form, DO;  [facility];  Service: Plastics;  Laterality: N/A;   BACK SURGERY  1982   BRONCHOSCOPY  2001   CATARACT EXTRACTION, BILATERAL  09/2019, 10/2019   CHOLECYSTECTOMY  1984   INCISION AND DRAINAGE OF WOUND N/A 09/26/2018   Procedure: Debridement of spine wound;  Surgeon: Peggye Form, DO;  [facility]: [facility];  Service: Plastics;  Laterality: N/A;   KNEE SURGERY Bilateral 08/27/2010 (L) and 01/11/2011 (R)   LUMBAR LAMINECTOMY/DECOMPRESSION MICRODISCECTOMY N/A 07/03/2018   Procedure: Lumbar Three to Lumbar Five Laminectomy;  Surgeon: Jadene Pierini, MD;  [facility];  Service: Neurosurgery;  Laterality: N/A;   LUMBAR WOUND DEBRIDEMENT N/A 08/20/2018   Procedure: POSTERIOR LUMBAR SPINAL WOUND DEBRIDEMENT AND REVISION;  Surgeon: Jadene Pierini, MD;  [facility];  Service: Neurosurgery;  Laterality: N/A;  POSTERIOR LUMBAR SPINAL WOUND REVISION   miscarrage  1962   OVARIAN CYST SURGERY  1968   thymus tumor     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   TONSILLECTOMY     Past Surgical History:  Procedure Laterality Date   ABDOMINAL HYSTERECTOMY  1974   abdominal tumor  2002   APPENDECTOMY     APPLICATION OF A-CELL OF BACK N/A 09/26/2018   Procedure: With Acell;  Surgeon: Peggye Form, DO;  [facility];  Service: Plastics;  Laterality: N/A;   APPLICATION OF WOUND VAC N/A 09/26/2018   Procedure: Vac placement;  Surgeon: Peggye Form, DO;  [facility];  Service: Plastics;  Laterality: N/A;   BACK SURGERY  1982   BRONCHOSCOPY  2001   CATARACT EXTRACTION, BILATERAL  09/2019, 10/2019   CHOLECYSTECTOMY  1984   INCISION AND DRAINAGE OF WOUND N/A 09/26/2018   Procedure: Debridement of spine wound;  Surgeon: Peggye Form, DO;  [facility];  Service: Plastics;  Laterality: N/A;    KNEE SURGERY Bilateral 08/27/2010 (L) and 01/11/2011 (R)   LUMBAR LAMINECTOMY/DECOMPRESSION MICRODISCECTOMY N/A 07/03/2018   Procedure: Lumbar Three to Lumbar Five Laminectomy;  Surgeon: Jadene Pierini, MD;  [facility];  Service: Neurosurgery;  Laterality: N/A;   LUMBAR WOUND DEBRIDEMENT N/A 08/20/2018   Procedure: POSTERIOR LUMBAR SPINAL WOUND DEBRIDEMENT AND REVISION;  Surgeon: Jadene Pierini, MD;  [facility];  Service: Neurosurgery;  Laterality: N/A;  POSTERIOR LUMBAR SPINAL WOUND REVISION   miscarrage  1962   OVARIAN CYST SURGERY  1968   thymus tumor  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   TONSILLECTOMY     Past Medical History:  Diagnosis Date   Abnormality of gait 04/19/2016   Acute infective polyneuritis (HCC) 2002   Allergic rhinitis due to pollen    Chronic pain syndrome    COPD (chronic obstructive pulmonary disease) (HCC)    chronic bronchitis   Depressive disorder, not elsewhere classified    Diabetes mellitus without complication (HCC)    Diaphragmatic hernia without mention of obstruction or gangrene    Dyslipidemia    Fibromyalgia    GERD (gastroesophageal reflux disease)    with h/o esophagitis   Guillain-Barre syndrome (HCC)    History of benign thymus tumor    Insomnia, unspecified    Lumbar spinal stenosis 03/30/2018   L4-5 level, severe   Miscarriage 1962   Mixed hyperlipidemia    Morbid obesity (HCC)    Osteoporosis    Pneumonia    Polyneuropathy in diabetes(357.2)    Rheumatoid arthritis(714.0)    Spondylosis, lumbosacral    Spontaneous ecchymoses    Type II or unspecified type diabetes mellitus with peripheral circulatory disorders, uncontrolled(250.72)    Unspecified chronic bronchitis (HCC)    Unspecified essential hypertension    Unspecified hypothyroidism    Unspecified pruritic disorder    Unspecified urinary incontinence    BP 138/72   Pulse 66   Ht 5\' 6"  (1.676 m)   Wt 206 lb (93.4 kg)   SpO2 95%   BMI 33.25 kg/m   Opioid Risk  Score:   Fall Risk Score:  `1  Depression screen [facility] 2/9     03/24/2023    8:51 AM 03/07/2023    8:36 AM 02/24/2023    9:31 AM 01/24/2023   12:54 PM 12/28/2022   10:52 AM 11/28/2022   11:21 AM 09/01/2022   10:53 AM  Depression screen PHQ 2/9  Decreased Interest 0 0 0 0 0 0 0  Down, Depressed, Hopeless 0 0 0 0 0 0 0  PHQ - 2 Score 0 0 0 0 0 0 0    Review of Systems  Musculoskeletal:  Positive for arthralgias, back pain, gait problem and neck pain.  All other systems reviewed and are negative.      Objective:   Physical Exam Vitals and nursing note reviewed.  Constitutional:      Appearance: Normal appearance.  Neck:     Comments: Cervical Paraspinal Tenderness: C-5-C-6 Cardiovascular:     Rate and Rhythm: Normal rate and regular rhythm.     Pulses: Normal pulses.     Heart sounds: Normal heart sounds.  Pulmonary:     Effort: Pulmonary effort is normal.     Breath sounds: Normal breath sounds.  Musculoskeletal:     Cervical back: Normal range of motion and neck supple.     Comments: Normal Muscle Bulk and Muscle Testing Reveals:  Upper Extremities: Right: Full ROM and Muscle Strength 4/5 Left Upper Extremity: Decreased ROM 90 Degrees and Muscle Strength 4/5  Lumbar Paraspinal Tenderness: L-4-L-5 Bilateral Greater trochanter Tenderness Lower Extremities: Full ROM and Muscle Strength 5/5 Arrived in wheelchair     Skin:    General: Skin is warm and dry.  Neurological:     Mental Status: She is alert and oriented to person, place, and time.  Psychiatric:        Mood and Affect: Mood normal.        Behavior: Behavior normal.         Assessment & Plan:  1.Failed Back Syndrome: Continue HEP as Tolerated. Continue to Monitor. 04/21/2023 2.Lumbar Radiculitis/ Chronic Low Back Pain without Sciatica: Continue Gabapentin. Continue to monitor. Continue HEP as Tolerated 04/21/2023 3.Bilateral  Greater Trochanter Bursitis: .Continue to Alternate Ice and Heat Therapy. Continue  current medication regimen. Continue to monitor. 04/21/2023 4.Bilateral Primary OA:/ Both Knees  Continue HEP as Tolerated. Continue to Monitor. 04/21/2023 5.Insomnia: Continue Amitriptyline . Continue to Monitor.04/21/2023 6.Chronic Pain Syndrome: Refilled: Oxycodone 10/325 mg one tablet every 4 hours as needed for pain #180. Second script sent for the following monh to accommodate schedule appointment. We will continue the opioid monitoring program, this consists of regular clinic visits, examinations, urine drug screen, pill counts as well as use of [place] Controlled Substance Reporting system. A 12 month History has been reviewed on the [place] Controlled Substance Reporting System on 04/21/2023 7. Cervicalgia/ Cervical Radiculitis: Continue Gabapentin. Continue HEP as tolerated. Continue to monitor. 04/21/2023 8. Polyarthralgia: Continue HEP as tolerated. Continue to monitor. 04/21/2023 9. Polyneuropathy: Continue Gabapentin. Continue to Monitor. 04/21/2023   F/U in 2 month

## 2023-04-26 LAB — DRUG TOX MONITOR 1 W/CONF, ORAL FLD
Amphetamines: NEGATIVE ng/mL (ref <10)
Barbiturates: NEGATIVE ng/mL (ref <10)
Benzodiazepines: NEGATIVE ng/mL (ref <0.50)
Buprenorphine: NEGATIVE ng/mL (ref <0.10)
Cocaine: NEGATIVE ng/mL (ref <5.0)
Codeine: NEGATIVE ng/mL (ref <2.5)
Dihydrocodeine: NEGATIVE ng/mL (ref <2.5)
Fentanyl: NEGATIVE ng/mL (ref <0.10)
Heroin Metabolite: NEGATIVE ng/mL (ref <1.0)
Hydrocodone: NEGATIVE ng/mL (ref <2.5)
Hydromorphone: NEGATIVE ng/mL (ref <2.5)
MARIJUANA: NEGATIVE ng/mL (ref <2.5)
MDMA: NEGATIVE ng/mL (ref <10)
Meprobamate: NEGATIVE ng/mL (ref <2.5)
Methadone: NEGATIVE ng/mL (ref <5.0)
Morphine: NEGATIVE ng/mL (ref <2.5)
Nicotine Metabolite: NEGATIVE ng/mL (ref <5.0)
Norhydrocodone: NEGATIVE ng/mL (ref <2.5)
Noroxycodone: 42.5 ng/mL — ABNORMAL HIGH (ref <2.5)
Opiates: POSITIVE ng/mL — AB (ref <2.5)
Oxycodone: >250 ng/mL — ABNORMAL HIGH (ref <2.5)
Oxymorphone: NEGATIVE ng/mL (ref <2.5)
Phencyclidine: NEGATIVE ng/mL (ref <10)
Tapentadol: NEGATIVE ng/mL (ref <5.0)
Tramadol: NEGATIVE ng/mL (ref <5.0)
Zolpidem: NEGATIVE ng/mL (ref <5.0)

## 2023-04-26 LAB — DRUG TOX ALC METAB W/CON, ORAL FLD: Alcohol Metabolite: NEGATIVE ng/mL (ref <25)

## 2023-05-23 ENCOUNTER — Ambulatory Visit: Payer: Medicare Other | Admitting: Registered Nurse

## 2023-05-30 DIAGNOSIS — H2513 Age-related nuclear cataract, bilateral: Secondary | ICD-10-CM | POA: Diagnosis not present

## 2023-05-31 ENCOUNTER — Other Ambulatory Visit: Payer: Self-pay | Admitting: Physician Assistant

## 2023-05-31 DIAGNOSIS — M0579 Rheumatoid arthritis with rheumatoid factor of multiple sites without organ or systems involvement: Secondary | ICD-10-CM

## 2023-05-31 DIAGNOSIS — Z79899 Other long term (current) drug therapy: Secondary | ICD-10-CM

## 2023-06-08 NOTE — Progress Notes (Signed)
Subjective:    Patient ID: Katherine Delgado, female    DOB: 1940/10/19, 82 y.o.   MRN: [id number]  HPI: Katherine Delgado is a 82 y.o. female who returns for follow up appointment for chronic pain and medication refill. She states her pain is located in her neck radiating into her bilateral shoulders, she reports her upper extremities are achy, mid- lower back radiating into her lower extremities She also reports bilateral hip pain,bilateral knee pain and generalized joint pain.She  rates her pain 9. Her current exercise regime is walking short distances in her home with walker and performing chair stretching exercises.  [name] Morphine equivalent is 90.00 MME.   Last Oral Swab was Performed on 04/21/2023, it was consistent.      Pain Inventory Average Pain 8 Pain Right Now 9 My pain is constant, sharp, burning, dull, stabbing, tingling, and aching  In the last 24 hours, has pain interfered with the following? General activity 10 Relation with others 10 Enjoyment of life 10 What TIME of day is your pain at its worst? morning , daytime, and night Sleep (in general) Poor  Pain is worse with: walking, bending, sitting, inactivity, standing, and some activites Pain improves with: rest, pacing activities, and medication Relief from Meds: 5  Family History  Problem Relation Age of Onset   Alzheimer's disease Mother    Heart disease Mother    Heart disease Father    Liver disease Father    Cancer Brother    Colon polyps Brother    Arthritis Son    Colon cancer Neg Hx    Esophageal cancer Neg Hx    Kidney disease Neg Hx    Stomach cancer Neg Hx    Rectal cancer Neg Hx    Social History   Socioeconomic History   Marital status: Widowed    Spouse name: Not on file   Number of children: 2   Years of education: 14   Highest education level: Not on file  Occupational History   Occupation: Retired  Tobacco Use   Smoking status: Former    Current packs/day: 0.00    Average  packs/day: 0.1 packs/day for 10.0 years (1.0 ttl pk-yrs)    Types: Cigarettes    Start date: 12/06/1969    Quit date: 12/07/1979    Years since quitting: 43.5    Passive exposure: Current   Smokeless tobacco: Never  Vaping Use   Vaping status: Never Used  Substance and Sexual Activity   Alcohol use: No    Alcohol/week: 0.0 standard drinks of alcohol   Drug use: No   Sexual activity: Not Currently  Other Topics Concern   Not on file  Social History Narrative   Walks with cane   Right handed    Caffeine use: Coffee (2 cups every morning)   Tea: sometimes   Soda: none   Social Determinants of Health   Financial Resource Strain: Medium Risk (10/05/2017)   Overall Financial Resource Strain (CARDIA)    Difficulty of Paying Living Expenses: Somewhat hard  Food Insecurity: No Food Insecurity (10/25/2022)   Hunger Vital Sign    Worried About Running Out of Food in the Last Year: Never true    Ran Out of Food in the Last Year: Never true  Transportation Needs: No Transportation Needs (10/25/2022)   PRAPARE - Administrator, Civil Service (Medical): No    Lack of Transportation (Non-Medical): No  Physical Activity: Inactive (10/05/2017)  Exercise Vital Sign    Days of Exercise per Week: 0 days    Minutes of Exercise per Session: 0 min  Stress: Stress Concern Present (10/05/2017)   [employer] of Occupational Health - Occupational Stress Questionnaire    Feeling of Stress : Rather much  Social Connections: Moderately Isolated (10/05/2017)   Social Connection and Isolation Panel [NHANES]    Frequency of Communication with Friends and Family: More than three times a week    Frequency of Social Gatherings with Friends and Family: More than three times a week    Attends Religious Services: Never    Database administrator or Organizations: No    Attends Banker Meetings: Never    Marital Status: Widowed   Past Surgical History:  Procedure Laterality Date    ABDOMINAL HYSTERECTOMY  1974   abdominal tumor  2002   APPENDECTOMY     APPLICATION OF A-CELL OF BACK N/A 09/26/2018   Procedure: With Acell;  Surgeon: Peggye Form, DO;  [facility];  Service: Plastics;  Laterality: N/A;   APPLICATION OF WOUND VAC N/A 09/26/2018   Procedure: Vac placement;  Surgeon: Peggye Form, DO;  [facility];  Service: Plastics;  Laterality: N/A;   BACK SURGERY  1982   BRONCHOSCOPY  2001   CATARACT EXTRACTION, BILATERAL  09/2019, 10/2019   CHOLECYSTECTOMY  1984   INCISION AND DRAINAGE OF WOUND N/A 09/26/2018   Procedure: Debridement of spine wound;  Surgeon: Peggye Form, DO;  [facility];  Service: Plastics;  Laterality: N/A;   KNEE SURGERY Bilateral 08/27/2010 (L) and 01/11/2011 (R)   LUMBAR LAMINECTOMY/DECOMPRESSION MICRODISCECTOMY N/A 07/03/2018   Procedure: Lumbar Three to Lumbar Five Laminectomy;  Surgeon: Jadene Pierini, MD;  [facility];  Service: Neurosurgery;  Laterality: N/A;   LUMBAR WOUND DEBRIDEMENT N/A 08/20/2018   Procedure: POSTERIOR LUMBAR SPINAL WOUND DEBRIDEMENT AND REVISION;  Surgeon: Jadene Pierini, MD;  [facility];  Service: Neurosurgery;  Laterality: N/A;  POSTERIOR LUMBAR SPINAL WOUND REVISION   miscarrage  1962   OVARIAN CYST SURGERY  1968   thymus tumor     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   TONSILLECTOMY     Past Surgical History:  Procedure Laterality Date   ABDOMINAL HYSTERECTOMY  1974   abdominal tumor  2002   APPENDECTOMY     APPLICATION OF A-CELL OF BACK N/A 09/26/2018   Procedure: With Acell;  Surgeon: Peggye Form, DO;  [facility];  Service: Plastics;  Laterality: N/A;   APPLICATION OF WOUND VAC N/A 09/26/2018   Procedure: Vac placement;  Surgeon: Peggye Form, DO;  [facility];  Service: Plastics;  Laterality: N/A;   BACK SURGERY  1982   BRONCHOSCOPY  2001   CATARACT EXTRACTION, BILATERAL  09/2019, 10/2019   CHOLECYSTECTOMY  1984   INCISION AND DRAINAGE OF WOUND N/A  09/26/2018   Procedure: Debridement of spine wound;  Surgeon: Peggye Form, DO;  [facility];  Service: Plastics;  Laterality: N/A;   KNEE SURGERY Bilateral 08/27/2010 (L) and 01/11/2011 (R)   LUMBAR LAMINECTOMY/DECOMPRESSION MICRODISCECTOMY N/A 07/03/2018   Procedure: Lumbar Three to Lumbar Five Laminectomy;  Surgeon: Jadene Pierini, MD;  [facility];  Service: Neurosurgery;  Laterality: N/A;   LUMBAR WOUND DEBRIDEMENT N/A 08/20/2018   Procedure: POSTERIOR LUMBAR SPINAL WOUND DEBRIDEMENT AND REVISION;  Surgeon: Jadene Pierini, MD;  [facility];  Service: Neurosurgery;  Laterality: N/A;  POSTERIOR LUMBAR  SPINAL WOUND REVISION   miscarrage  1962   OVARIAN CYST SURGERY  1968   thymus tumor     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   TONSILLECTOMY     Past Medical History:  Diagnosis Date   Abnormality of gait 04/19/2016   Acute infective polyneuritis (HCC) 2002   Allergic rhinitis due to pollen    Chronic pain syndrome    COPD (chronic obstructive pulmonary disease) (HCC)    chronic bronchitis   Depressive disorder, not elsewhere classified    Diabetes mellitus without complication (HCC)    Diaphragmatic hernia without mention of obstruction or gangrene    Dyslipidemia    Fibromyalgia    GERD (gastroesophageal reflux disease)    with h/o esophagitis   Guillain-Barre syndrome (HCC)    History of benign thymus tumor    Insomnia, unspecified    Lumbar spinal stenosis 03/30/2018   L4-5 level, severe   Miscarriage 1962   Mixed hyperlipidemia    Morbid obesity (HCC)    Osteoporosis    Pneumonia    Polyneuropathy in diabetes(357.2)    Rheumatoid arthritis(714.0)    Spondylosis, lumbosacral    Spontaneous ecchymoses    Type II or unspecified type diabetes mellitus with peripheral circulatory disorders, uncontrolled(250.72)    Unspecified chronic bronchitis (HCC)    Unspecified essential hypertension    Unspecified hypothyroidism    Unspecified pruritic disorder     Unspecified urinary incontinence    BP 129/70   Pulse 79   Ht 5\' 6"  (1.676 m)   Wt 206 lb (93.4 kg)   SpO2 95%   BMI 33.25 kg/m   Opioid Risk Score:   Fall Risk Score:  `1  Depression screen [facility] 2/9     06/09/2023    9:23 AM 04/21/2023    8:26 AM 03/24/2023    8:51 AM 03/07/2023    8:36 AM 02/24/2023    9:31 AM 01/24/2023   12:54 PM 12/28/2022   10:52 AM  Depression screen PHQ 2/9  Decreased Interest 0 0 0 0 0 0 0  Down, Depressed, Hopeless 0 0 0 0 0 0 0  PHQ - 2 Score 0 0 0 0 0 0 0    Review of Systems  Musculoskeletal:  Positive for arthralgias, back pain and gait problem.       Pain all over the body  All other systems reviewed and are negative.      Objective:   Physical Exam Vitals and nursing note reviewed.  Constitutional:      Appearance: Normal appearance.  Neck:     Comments: Cervical Paraspinal Tenderness: C-5-C-6  Decreased ROM with Right and Left rotation  Cardiovascular:     Rate and Rhythm: Normal rate and regular rhythm.     Pulses: Normal pulses.     Heart sounds: Normal heart sounds.  Pulmonary:     Effort: Pulmonary effort is normal.     Breath sounds: Normal breath sounds.  Musculoskeletal:     Comments: Normal Muscle Bulk and Muscle Testing Reveals:  Upper Extremities: Right: Decreased ROM 90 Degrees  and Muscle Strength 4/5 Left Upper Extremity: Decreased ROM 45 Degrees and Muscle Strength 4/5 Thoracic Paraspinal Tenderness: T-7-T-9 Lumbar Paraspinal Tenderness: L-4-L-5 Lower Extremities: Full ROM and Muscle Strength 5/5 Bilateral Lower Extremities Flexion Produces Pain into her Bilateral Lower Extremity Arrived in wheelchair     Skin:    General: Skin is warm and dry.  Neurological:     Mental Status: She is alert  and oriented to person, place, and time.  Psychiatric:        Mood and Affect: Mood normal.        Behavior: Behavior normal.         Assessment & Plan:  1.Failed Back Syndrome: Continue HEP as Tolerated. Continue to  Monitor. 06/09/2023 2.Lumbar Radiculitis/ Chronic Low Back Pain without Sciatica: Continue Gabapentin. Continue to monitor. Continue HEP as Tolerated 06/09/2023 3.Bilateral  Greater Trochanter Bursitis: .Continue to Alternate Ice and Heat Therapy. Continue current medication regimen. Continue to monitor. 04/21/2023 4.Bilateral Primary OA:/ Both Knees  Continue HEP as Tolerated. Continue to Monitor. 06/09/2023 5.Insomnia: Continue Amitriptyline . Continue to Monitor.06/09/2023 6.Chronic Pain Syndrome: Refilled: Oxycodone 10/325 mg one tablet every 4 hours as needed for pain #180. Second script sent for the following monh to accommodate schedule appointment. We will continue the opioid monitoring program, this consists of regular clinic visits, examinations, urine drug screen, pill counts as well as use of [place] Controlled Substance Reporting system. A 12 month History has been reviewed on the [place] Controlled Substance Reporting System on 06/09/2023 7. Cervicalgia/ Cervical Radiculitis: Continue Gabapentin. Continue HEP as tolerated. Continue to monitor. 06/09/2023 8. Polyarthralgia: Continue HEP as tolerated. Continue to monitor. 06/09/2023 9. Polyneuropathy: Continue Gabapentin. Continue to Monitor. 06/09/2023   F/U in 2 month

## 2023-06-09 ENCOUNTER — Encounter: Payer: Medicare Other | Attending: Physical Medicine and Rehabilitation | Admitting: Registered Nurse

## 2023-06-09 ENCOUNTER — Encounter: Payer: Self-pay | Admitting: Registered Nurse

## 2023-06-09 VITALS — BP 129/70 | HR 79 | Ht 66.0 in | Wt 206.0 lb

## 2023-06-09 DIAGNOSIS — M545 Low back pain, unspecified: Secondary | ICD-10-CM | POA: Diagnosis not present

## 2023-06-09 DIAGNOSIS — M7062 Trochanteric bursitis, left hip: Secondary | ICD-10-CM | POA: Diagnosis not present

## 2023-06-09 DIAGNOSIS — M546 Pain in thoracic spine: Secondary | ICD-10-CM | POA: Insufficient documentation

## 2023-06-09 DIAGNOSIS — Z5181 Encounter for therapeutic drug level monitoring: Secondary | ICD-10-CM | POA: Insufficient documentation

## 2023-06-09 DIAGNOSIS — M961 Postlaminectomy syndrome, not elsewhere classified: Secondary | ICD-10-CM | POA: Insufficient documentation

## 2023-06-09 DIAGNOSIS — Z79891 Long term (current) use of opiate analgesic: Secondary | ICD-10-CM | POA: Insufficient documentation

## 2023-06-09 DIAGNOSIS — M2550 Pain in unspecified joint: Secondary | ICD-10-CM | POA: Diagnosis not present

## 2023-06-09 DIAGNOSIS — M542 Cervicalgia: Secondary | ICD-10-CM | POA: Insufficient documentation

## 2023-06-09 DIAGNOSIS — M7061 Trochanteric bursitis, right hip: Secondary | ICD-10-CM | POA: Insufficient documentation

## 2023-06-09 DIAGNOSIS — M25512 Pain in left shoulder: Secondary | ICD-10-CM | POA: Diagnosis not present

## 2023-06-09 DIAGNOSIS — G8929 Other chronic pain: Secondary | ICD-10-CM | POA: Diagnosis not present

## 2023-06-09 DIAGNOSIS — G894 Chronic pain syndrome: Secondary | ICD-10-CM | POA: Diagnosis not present

## 2023-06-09 DIAGNOSIS — M5412 Radiculopathy, cervical region: Secondary | ICD-10-CM | POA: Diagnosis not present

## 2023-06-09 DIAGNOSIS — M25511 Pain in right shoulder: Secondary | ICD-10-CM | POA: Diagnosis not present

## 2023-06-09 DIAGNOSIS — M170 Bilateral primary osteoarthritis of knee: Secondary | ICD-10-CM | POA: Diagnosis not present

## 2023-06-09 MED ORDER — OXYCODONE-ACETAMINOPHEN 10-325 MG PO TABS: 1.0000 | ORAL_TABLET | ORAL | 0 refills | Status: DC | PRN

## 2023-06-12 LAB — HM DIABETES EYE EXAM

## 2023-06-13 NOTE — Progress Notes (Unsigned)
Office Visit Note  Patient: Katherine Delgado             Date of Birth: [birth year]           MRN: [id number]             PCP: Caesar Bookman, NP Referring: Caesar Bookman, NP Visit Date: 06/27/2023 Occupation: @GUAROCC @  Subjective:  Total body pain   History of Present Illness: Katherine Delgado is a 82 y.o. female with history of seropositive rheumatoid arthritis, sjogren's syndrome, and osteoarthritis.  Patient remains on Xeljanz 5 mg 1 tablet by mouth daily.  She has been tolerating Harriette Ohara without any side effects and has not had any recent gaps in therapy.  Patient states he has been back on [place] for about 2 months.  Patient continues to have total body pain as well as discomfort in both knees.  Patient states that her knee joints are bone-on-bone.  She is primarily using a walker but having to stand for prolonged peers of time she notices increased fluid retention in her feet.  She states that the swelling in her hands has improved since reinitiating Harriette Ohara but she continues to have discomfort.  Patient rates her pain an 8 out of 10. Patient states she continues to have recurrent skin yeast infections.  She has been using topical agents recently.  Patient states that she can never get the yeast infection to completely clear which is why she feels that it continues to recur.    Activities of Daily Living:  Patient reports morning stiffness for 2-3 hours.   Patient Reports nocturnal pain.  Difficulty dressing/grooming: Reports Difficulty climbing stairs: Reports Difficulty getting out of chair: Reports Difficulty using hands for taps, buttons, cutlery, and/or writing: Reports  Review of Systems  Constitutional:  Positive for fatigue.  HENT:  Positive for mouth dryness. Negative for mouth sores.   Eyes:  Positive for dryness.  Cardiovascular:  Negative for chest pain and palpitations.  Gastrointestinal:  Positive for constipation. Negative for blood in stool and diarrhea.   Endocrine: Positive for increased urination.  Genitourinary:  Positive for involuntary urination.  Musculoskeletal:  Positive for joint pain, joint pain, joint swelling, myalgias, muscle weakness, morning stiffness, muscle tenderness and myalgias.  Skin:  Positive for rash and hair loss. Negative for color change and sensitivity to sunlight.  Allergic/Immunologic: Positive for susceptible to infections.  Neurological:  Negative for dizziness and headaches.  Hematological:  Negative for swollen glands.  Psychiatric/Behavioral:  Positive for sleep disturbance. Negative for depressed mood. The patient is not nervous/anxious.     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   Primary osteoarthritis of right knee 07/25/2019   Wound dehiscence 08/17/2018   Lumbar spinal stenosis 03/30/2018   Rheumatoid arthritis involving both wrists with positive rheumatoid factor (HCC) 06/14/2016   High risk medication use 06/14/2016   Sjogren's syndrome (HCC) 06/14/2016   Primary osteoarthritis of both knees 06/14/2016   DDD (degenerative disc disease), lumbar 06/14/2016   Hypothyroidism 06/14/2016   Osteoporosis 06/14/2016   Abnormality of gait 04/19/2016   Type 2 diabetes mellitus with diabetic polyneuropathy, with long-term current use of insulin (HCC) 11/27/2015   Diabetes mellitus without complication (HCC) 09/12/2013  Headache 07/31/2013   Sinus infection 07/31/2013   Chronic right-sided low back pain with right-sided sciatica 03/14/2013   Insomnia 12/06/2012   Nausea alone 12/06/2012   Diarrhea 12/06/2012   Urinary incontinence, urge 12/06/2012   Lumbosacral root lesions, not elsewhere classified 11/27/2012    Diabetic polyneuropathy associated with type 2 diabetes mellitus (HCC) 11/27/2012   EDEMA 05/04/2010   YEAST INFECTION 05/03/2010   Hyperlipidemia 05/03/2010   DEPRESSION 05/03/2010   History of peripheral neuropathy 05/03/2010   Essential hypertension 05/03/2010   ALLERGIC RHINITIS 05/03/2010   PNEUMONIA 05/03/2010   COPD (chronic obstructive pulmonary disease) (HCC) 05/03/2010   GERD 05/03/2010   Fibromyalgia 05/03/2010   DYSPNEA 05/03/2010   CHEST PAIN 05/03/2010    Past Medical History:  Diagnosis Date   Abnormality of gait 04/19/2016   Acute infective polyneuritis (HCC) 2002   Allergic rhinitis due to pollen    Chronic pain syndrome    COPD (chronic obstructive pulmonary disease) (HCC)    chronic bronchitis   Depressive disorder, not elsewhere classified    Diabetes mellitus without complication (HCC)    Diaphragmatic hernia without mention of obstruction or gangrene    Dyslipidemia    Fibromyalgia    GERD (gastroesophageal reflux disease)    with h/o esophagitis   Guillain-Barre syndrome (HCC)    History of benign thymus tumor    Insomnia, unspecified    Lumbar spinal stenosis 03/30/2018   L4-5 level, severe   Miscarriage 1962   Mixed hyperlipidemia    Morbid obesity (HCC)    Osteoporosis    Pneumonia    Polyneuropathy in diabetes(357.2)    Rheumatoid arthritis(714.0)    Spondylosis, lumbosacral    Spontaneous ecchymoses    Type II or unspecified type diabetes mellitus with peripheral circulatory disorders, uncontrolled(250.72)    Unspecified chronic bronchitis (HCC)    Unspecified essential hypertension    Unspecified hypothyroidism    Unspecified pruritic disorder    Unspecified urinary incontinence     Family History  Problem Relation Age of Onset   Alzheimer's disease Mother    Heart disease Mother    Heart disease Father    Liver disease Father    Cancer Brother    Colon polyps Brother    Arthritis Son    Colon cancer Neg Hx    Esophageal cancer  Neg Hx    Kidney disease Neg Hx    Stomach cancer Neg Hx    Rectal cancer Neg Hx    Past Surgical History:  Procedure Laterality Date   ABDOMINAL HYSTERECTOMY  1974   abdominal tumor  2002   APPENDECTOMY     APPLICATION OF A-CELL OF BACK N/A 09/26/2018   Procedure: With Acell;  Surgeon: Peggye Form, DO;  Location: [facility] OR;  Service: Plastics;  Laterality: N/A;   APPLICATION OF WOUND VAC N/A 09/26/2018   Procedure: Vac placement;  Surgeon: Peggye Form, DO;  Location: [facility] OR;  Service: Plastics;  Laterality: N/A;   BACK SURGERY  1982   BRONCHOSCOPY  2001   CATARACT EXTRACTION, BILATERAL  09/2019, 10/2019   CHOLECYSTECTOMY  1984   INCISION AND DRAINAGE OF WOUND N/A 09/26/2018   Procedure: Debridement of spine wound;  Surgeon: Peggye Form, DO;  Location: [facility] OR;  Service: Plastics;  Laterality: N/A;   KNEE SURGERY Bilateral 08/27/2010 (L) and 01/11/2011 (R)   LUMBAR LAMINECTOMY/DECOMPRESSION MICRODISCECTOMY N/A 07/03/2018   Procedure: Lumbar Three to Lumbar Five Laminectomy;  Surgeon: Jadene Pierini, MD;  Location:  [facility] OR;  Service: Neurosurgery;  Laterality: N/A;   LUMBAR WOUND DEBRIDEMENT N/A 08/20/2018   Procedure: POSTERIOR LUMBAR SPINAL WOUND DEBRIDEMENT AND REVISION;  Surgeon: Jadene Pierini, MD;  Location: [facility] OR;  Service: Neurosurgery;  Laterality: N/A;  POSTERIOR LUMBAR SPINAL WOUND REVISION   miscarrage  1962   OVARIAN CYST SURGERY  1968   thymus tumor     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   TONSILLECTOMY     Social History   Social History Narrative   Walks with cane   Right handed    Caffeine use: Coffee (2 cups every morning)   Tea: sometimes   Soda: none   Immunization History  Administered Date(s) Administered   Influenza-Unspecified 06/10/2011   PFIZER(Purple Top)SARS-COV-2 Vaccination 11/13/2019, 11/30/2019   Pneumococcal Conjugate-13 07/07/2016   Pneumococcal Polysaccharide-23 11/21/2003, 10/05/2017   Tdap 05/28/2016     Objective: Vital  Signs: BP (!) 160/71 (BP Location: Left Arm, Patient Position: Sitting, Cuff Size: Normal)   Pulse 71   Resp 14   Ht 5\' 6"  (1.676 m)   Wt 206 lb (93.4 kg) Comment: per patient  BMI 33.25 kg/m    Physical Exam Vitals and nursing note reviewed.  Constitutional:      Appearance: She is well-developed.  HENT:     Head: Normocephalic and atraumatic.  Eyes:     Conjunctiva/sclera: Conjunctivae normal.  Cardiovascular:     Rate and Rhythm: Normal rate and regular rhythm.     Heart sounds: Normal heart sounds.  Pulmonary:     Effort: Pulmonary effort is normal.     Breath sounds: Normal breath sounds.  Abdominal:     General: Bowel sounds are normal.     Palpations: Abdomen is soft.  Musculoskeletal:     Cervical back: Normal range of motion.  Lymphadenopathy:     Cervical: No cervical adenopathy.  Skin:    General: Skin is warm and dry.     Capillary Refill: Capillary refill takes less than 2 seconds.  Neurological:     Mental Status: She is alert and oriented to person, place, and time.  Psychiatric:        Behavior: Behavior normal.      Musculoskeletal Exam: Patient remained in the wheelchair during the examination today.  C-spine has limited range of motion.  Thoracic kyphosis noted.  Right shoulder has full range of motion.  Painful limited range of motion of the left shoulder.  Synovial thickening of both wrist joints.  Ulnar deviation and synovial thickening of MCP joints.  No synovitis noted on examination today.  Painful range of motion of both knee joints.  Ankle joints have good range of motion with no synovitis.  CDAI Exam: CDAI Score: -- Patient Global: --; Provider Global: -- Swollen: --; Tender: -- Joint Exam 06/27/2023   No joint exam has been documented for this visit   There is currently no information documented on the homunculus. Go to the Rheumatology activity and complete the homunculus joint exam.  Investigation: No additional  findings.  Imaging: No results found.  Recent Labs: Lab Results  Component Value Date   WBC 13.6 (H) 03/08/2023   HGB 13.8 03/08/2023   PLT 346 03/08/2023   NA 139 03/08/2023   K 4.5 03/08/2023   CL 102 03/08/2023   CO2 29 03/08/2023   GLUCOSE 146 (H) 03/08/2023   BUN 20 03/08/2023   CREATININE 0.98 (H) 03/08/2023   BILITOT 0.3 03/08/2023   ALKPHOS 96 01/25/2021   AST  11 03/08/2023   ALT 9 03/08/2023   PROT 6.7 03/08/2023   ALBUMIN 3.5 01/25/2021   CALCIUM 9.5 03/08/2023   GFRAA 65 02/12/2021   QFTBGOLDPLUS NEGATIVE 03/27/2023    Speciality Comments: No specialty comments available.  Procedures:  No procedures performed Allergies: Influenza vaccines, Penicillins, Statins, and Sulfamethoxazole-trimethoprim   Assessment / Plan:     Visit Diagnoses: Rheumatoid arthritis involving multiple sites with positive rheumatoid factor (HCC): She has no synovitis on examination today.  Patient continues to experience chronic total body pain.  Patient currently rates her pain an 8 out of 10.  No active inflammation was noted on examination today.  She is been taking Xeljanz 5 mg 1 tablet by mouth daily.  She resumed Harriette Ohara about 2 months ago and has noticed less inflammation in her hands but does not feel that her pain level has been adequately controlled.  She takes Percocet as needed for pain relief and remains on Cymbalta and gabapentin as prescribed.   She will remain on Xeljanz 5 mg 1 tablet by mouth daily.  A refill was sent to the pharmacy on 06/25/2023.  Discussed that if she has recurrence of a skin candidal infection she should hold Xeljanz until the infection is completely cleared. She will follow up in 3 months or sooner if needed.   High risk medication use - Xeljanz 5 mg 1 tablet by mouth daily. D/c Arava-SE of diarrhea, d/c MTX due to recurent infections. CBC and CMP updated on 03/08/23. Orders for CBC and CMP released today. Otilio Connors be due in February and every 3 months to  monitor for drug toxicity. Lipid panel updated on 03/08/23.  TB gold negative 03/27/23. Patient continues to experience recurrent skin candidal infections.  Patient was advised to hold Xeljanz until the yeast infection has completely resolved.  She voiced understanding.  Patient plans on reaching out to her PCP to discuss treatment optoins for skin clearance.   Discussed the importance of holding xeljanz if she develops any other signs or symptoms of an infection and to resume once the infection has completely cleared.   - Plan: CBC with Differential/Platelet, COMPLETE METABOLIC PANEL WITH GFR  Sjogren's syndrome with keratoconjunctivitis [facility]): Patient continues to have chronic sicca symptoms.  She has noticed increased hoarseness and was evaluated by ENT yesterday-TFL reassuring.  She will be initiating a trial of pantoprazole and gaviscon.  Deferred speech therapy.   Primary osteoarthritis of both knees: Chronic pain.  Good range of motion with no effusion noted on exam.  Primarily wheelchair-bound.  Degeneration of intervertebral disc of lumbar region without discogenic back pain or lower extremity pain: Chronic pain and limited mobility.  Patient remained in a wheelchair during the exam.   Age-related osteoporosis without current pathological fracture: DEXA followed by PCP.   Other insomnia: She experiences nocturnal pain which causes interrupted sleep at night.  Fibromyalgia: Patient continues to experience intermittent myalgias and muscle tenderness due to fibromyalgia.  She is currently experiencing total body pain and rates her pain an 8 out of 10.  She remains on Cymbalta and gabapentin as prescribed.  She is also taking Percocet as needed for pain relief.  Other medical conditions are listed as follows:  Herpes zoster without complication  History of diabetes mellitus  History of Guillain-Barre syndrome  History of COPD  History of gastroesophageal reflux (GERD)  History  of hypertension: Blood pressure was elevated today in the office was rechecked prior to leaving.  Patient was advised to monitor  blood pressure closely following the office visit today.  History of depression  History of peripheral neuropathy  Orders: Orders Placed This Encounter  Procedures   CBC with Differential/Platelet   COMPLETE METABOLIC PANEL WITH GFR   No orders of the defined types were placed in this encounter.  Follow-Up Instructions: Return in about 3 months (around 09/27/2023) for Rheumatoid arthritis.   Gearldine Bienenstock, PA-C  Note - This record has been created using Dragon software.  Chart creation errors have been sought, but may not always  have been located. Such creation errors do not reflect on  the standard of medical care.

## 2023-06-14 ENCOUNTER — Encounter: Payer: Self-pay | Admitting: Internal Medicine

## 2023-06-17 ENCOUNTER — Other Ambulatory Visit: Payer: Self-pay | Admitting: Family

## 2023-06-19 ENCOUNTER — Other Ambulatory Visit: Payer: Self-pay | Admitting: Family

## 2023-06-19 NOTE — Telephone Encounter (Signed)
Patient medication has high risk warnings, and allergy contraindications.

## 2023-06-23 ENCOUNTER — Other Ambulatory Visit: Payer: Self-pay | Admitting: Physician Assistant

## 2023-06-23 DIAGNOSIS — M0579 Rheumatoid arthritis with rheumatoid factor of multiple sites without organ or systems involvement: Secondary | ICD-10-CM

## 2023-06-23 DIAGNOSIS — Z79899 Other long term (current) drug therapy: Secondary | ICD-10-CM

## 2023-06-23 NOTE — Telephone Encounter (Signed)
Last Fill: 03/29/2023  Labs: 03/08/2023 WBC 13.6, neutro abs 10,186, absolute monocytes 1,034, eosinophils absolute 585, glucose 146, creat 0.98, eGFR 58  TB Gold: 03/27/2023 negative    Next Visit: 06/27/2023  Last Visit: 03/27/2023  [id number] arthritis involving multiple sites with positive rheumatoid factor   Current Dose per office note on 03/27/2023: Xeljanz 5 mg 1 tablet by mouth daily.   Patient is due for follow up and labs on 06/27/2023. Do you want to hold off on refill until then?

## 2023-06-26 ENCOUNTER — Encounter (INDEPENDENT_AMBULATORY_CARE_PROVIDER_SITE_OTHER): Payer: Self-pay

## 2023-06-26 ENCOUNTER — Ambulatory Visit (INDEPENDENT_AMBULATORY_CARE_PROVIDER_SITE_OTHER): Payer: Medicare Other | Admitting: Otolaryngology

## 2023-06-26 VITALS — Ht 66.0 in | Wt 206.0 lb

## 2023-06-26 DIAGNOSIS — E119 Type 2 diabetes mellitus without complications: Secondary | ICD-10-CM | POA: Diagnosis not present

## 2023-06-26 DIAGNOSIS — R490 Dysphonia: Secondary | ICD-10-CM

## 2023-06-26 DIAGNOSIS — J383 Other diseases of vocal cords: Secondary | ICD-10-CM | POA: Diagnosis not present

## 2023-06-26 DIAGNOSIS — H906 Mixed conductive and sensorineural hearing loss, bilateral: Secondary | ICD-10-CM

## 2023-06-26 DIAGNOSIS — K219 Gastro-esophageal reflux disease without esophagitis: Secondary | ICD-10-CM

## 2023-06-26 MED ORDER — PANTOPRAZOLE SODIUM 40 MG PO TBEC: 40.0000 mg | DELAYED_RELEASE_TABLET | Freq: Every day | ORAL | 3 refills | Status: AC

## 2023-06-26 NOTE — Progress Notes (Signed)
Dear Dr. Elam Dutch, Here is my assessment for our mutual patient, Katherine Delgado. Thank you for allowing me the opportunity to care for your patient. Please do not hesitate to contact me should you have any other questions. Sincerely, Dr. Jovita Kussmaul  Otolaryngology Clinic Note Referring provider: Dr. Elam Dutch HPI:  Katherine Delgado is a 82 y.o. female kindly referred by Dr. Elam Dutch for evaluation of hoarseness.  Initial visit (06/26/2023): She reports that she has had problems with her voice for a couple of years. Perhaps after COVID infxn. She reports that her voice is more hoarse. It is intermittent, voice will "come and go". Never completely losing her voice. Worse with a cold, use and at end of day. Nothing makes it better except for drinking liquids (hot tea etc.). Perfumes or smells could trigger throat clearing, some dry cough but not significantly bothersome. No pain in throat, ear pain, no trouble swallowing, no odynophagia. No neck masses. She is on gabapentin 600mg  tablet per day for chronic pain Does have some GERD Sx.  She is retired so no significant vocal demands. She was in food sales Hydration: fair - about 32 oz She does not smoke - quit 50 years ago. No alcohol use.  H&N Surgery: thymoma surgery (~20 years ago) Personal or FHx of bleeding dz or anesthesia difficulty: no AP/AC: On ASA 81 - not consistently  PMHx: COPD,Hiatal hernia, HTN, T2DM, Rheumatoid arthritis, chronic pain, Hypothyroidism  Independent Review of Additional Tests or Records:  02/2023 Referral notes reviewed: CT Neck 2017: No obvious large aerodigestive masses noted Dr. Jenne Pane 2017: For ears, b/l tubes placed; Audio shows - Pure tone audiometry reveals mild to moderately severe sensorineural loss in the right ear. Left-sided testing demonstrates moderate to severe mixed loss with a 30-40 dB air-bone gap. Speech discrimination is 76% in the right ear and 40% in the left ear. Tympanograms are type A in the right  ear and type B in the left ear.  PMH/Meds/All/SocHx/FamHx/ROS:   Past Medical History:  Diagnosis Date   Abnormality of gait 04/19/2016   Acute infective polyneuritis (HCC) 2002   Allergic rhinitis due to pollen    Chronic pain syndrome    COPD (chronic obstructive pulmonary disease) (HCC)    chronic bronchitis   Depressive disorder, not elsewhere classified    Diabetes mellitus without complication (HCC)    Diaphragmatic hernia without mention of obstruction or gangrene    Dyslipidemia    Fibromyalgia    GERD (gastroesophageal reflux disease)    with h/o esophagitis   Guillain-Barre syndrome (HCC)    History of benign thymus tumor    Insomnia, unspecified    Lumbar spinal stenosis 03/30/2018   L4-5 level, severe   Miscarriage 1962   Mixed hyperlipidemia    Morbid obesity (HCC)    Osteoporosis    Pneumonia    Polyneuropathy in diabetes(357.2)    Rheumatoid arthritis(714.0)    Spondylosis, lumbosacral    Spontaneous ecchymoses    Type II or unspecified type diabetes mellitus with peripheral circulatory disorders, uncontrolled(250.72)    Unspecified chronic bronchitis (HCC)    Unspecified essential hypertension    Unspecified hypothyroidism    Unspecified pruritic disorder    Unspecified urinary incontinence      Past Surgical History:  Procedure Laterality Date   ABDOMINAL HYSTERECTOMY  1974   abdominal tumor  2002   APPENDECTOMY     APPLICATION OF A-CELL OF BACK N/A 09/26/2018   Procedure: With Acell;  Surgeon: Peggye Form, DO;  [facility];  Service: Plastics;  Laterality: N/A;   APPLICATION OF WOUND VAC N/A 09/26/2018   Procedure: Vac placement;  Surgeon: Peggye Form, DO;  [facility];  Service: Plastics;  Laterality: N/A;   BACK SURGERY  1982   BRONCHOSCOPY  2001   CATARACT EXTRACTION, BILATERAL  09/2019, 10/2019   CHOLECYSTECTOMY  1984   INCISION AND DRAINAGE OF WOUND N/A 09/26/2018   Procedure: Debridement of spine wound;  Surgeon: Peggye Form, DO;  [facility];  Service: Plastics;  Laterality: N/A;   KNEE SURGERY Bilateral 08/27/2010 (L) and 01/11/2011 (R)   LUMBAR LAMINECTOMY/DECOMPRESSION MICRODISCECTOMY N/A 07/03/2018   Procedure: Lumbar Three to Lumbar Five Laminectomy;  Surgeon: Jadene Pierini, MD;  [facility];  Service: Neurosurgery;  Laterality: N/A;   LUMBAR WOUND DEBRIDEMENT N/A 08/20/2018   Procedure: POSTERIOR LUMBAR SPINAL WOUND DEBRIDEMENT AND REVISION;  Surgeon: Jadene Pierini, MD;  [facility];  Service: Neurosurgery;  Laterality: N/A;  POSTERIOR LUMBAR SPINAL WOUND REVISION   miscarrage  1962   OVARIAN CYST SURGERY  1968   thymus tumor     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   TONSILLECTOMY      Family History  Problem Relation Age of Onset   Alzheimer's disease Mother    Heart disease Mother    Heart disease Father    Liver disease Father    Cancer Brother    Colon polyps Brother    Arthritis Son    Colon cancer Neg Hx    Esophageal cancer Neg Hx    Kidney disease Neg Hx    Stomach cancer Neg Hx    Rectal cancer Neg Hx      Social Connections: Moderately Isolated (10/05/2017)   Social Connection and Isolation Panel [NHANES]    Frequency of Communication with Friends and Family: More than three times a week    Frequency of Social Gatherings with Friends and Family: More than three times a week    Attends Religious Services: Never    Database administrator or Organizations: No    Attends Banker Meetings: Never    Marital Status: Widowed      Current Outpatient Medications:    amitriptyline (ELAVIL) 50 MG tablet, Take 1 tablet (50 mg total) by mouth at bedtime., Disp: 90 tablet, Rfl: 3   amLODipine (NORVASC) 10 MG tablet, TAKE 1 TABLET(10 MG) BY MOUTH DAILY, Disp: 90 tablet, Rfl: 1   aspirin EC 81 MG tablet, Take 81 mg by mouth 2 (two) times a week., Disp: , Rfl:    cholecalciferol (VITAMIN D3) 25 MCG (1000 UT) tablet, Take 1,000 Units by mouth daily., Disp: , Rfl:     Continuous Glucose Sensor (DEXCOM G7 SENSOR) MISC, 1 Device by Does not apply route as directed., Disp: 9 each, Rfl: 3   Cranberry 475 MG CAPS, Take 1 capsule (475 mg total) by mouth 2 (two) times daily., Disp: 60 capsule, Rfl: 3   docusate sodium (COLACE) 100 MG capsule, Take 100 mg by mouth daily as needed for mild constipation., Disp: , Rfl:    DULoxetine (CYMBALTA) 20 MG capsule, TAKE 1 CAPSULE(20 MG) BY MOUTH DAILY, Disp: 30 capsule, Rfl: 3   ezetimibe (ZETIA) 10 MG tablet, Take 1 tablet (10 mg total) by mouth daily., Disp: 90 tablet, Rfl: 3   fluconazole (DIFLUCAN) 150 MG tablet, Take 1 tablet (150 mg total) by mouth daily., Disp: 7 tablet, Rfl: 0   fluticasone (CUTIVATE) 0.05 %  cream, Apply topically 2 (two) times daily as needed., Disp: 30 g, Rfl: 3   fluticasone (FLONASE) 50 MCG/ACT nasal spray, SHAKE LIQUID AND USE 2 SPRAYS IN EACH NOSTRIL AT BEDTIME, Disp: 48 g, Rfl: 0   folic acid (FOLVITE) 1 MG tablet, Take 2 tablets (2 mg total) by mouth daily., Disp: 180 tablet, Rfl: 1   furosemide (LASIX) 20 MG tablet, TAKE 1 BY MOUTH TWICE DAILY FOR 3 DAYS AS NEEDED FOR 3 POUND WEIGHT GAIN IN ONE DAY OR 5 POUNDS IN ONE WEEK, Disp: 60 tablet, Rfl: 3   gabapentin (NEURONTIN) 600 MG tablet, TAKE 1 TABLET(600 MG) BY MOUTH THREE TIMES DAILY, Disp: 180 tablet, Rfl: 2   insulin glargine, 1 Unit Dial, (TOUJEO SOLOSTAR) 300 UNIT/ML Solostar Pen, 40 units QAM and 40 units  QPM, Disp: 30 mL, Rfl: 3   insulin lispro (HUMALOG KWIKPEN) 200 UNIT/ML KwikPen, Max daily 30 units, Disp: 30 mL, Rfl: 3   Insulin Pen Needle (B-D UF III MINI PEN NEEDLES) 31G X 5 MM MISC, 1 Device by Other route in the morning, at noon, in the evening, and at bedtime., Disp: 400 each, Rfl: 3   ipratropium-albuterol (DUONEB) 0.5-2.5 (3) MG/3ML SOLN, Take 3 mLs by nebulization every 6 (six) hours as needed., Disp: 360 mL, Rfl: 1   ketoconazole (NIZORAL) 2 % cream, Apply 1 Application topically 2 (two) times daily., Disp: 60 g, Rfl: 1    levothyroxine (SYNTHROID) 100 MCG tablet, Take 1 tablet (100 mcg total) by mouth daily before breakfast., Disp: 90 tablet, Rfl: 3   lidocaine (LIDODERM) 5 %, PLACE 1 PATCH ONTO THE SKIN DAILY AS DIRECTED, REMOVE AND DISCARD PATCH WITHN 12 HOURS OR AS DIRECTED BY MD, Disp: 90 patch, Rfl: 3   lisinopril (ZESTRIL) 20 MG tablet, TAKE 1 TABLET BY MOUTH EVERY DAY, Disp: 90 tablet, Rfl: 2   loperamide (IMODIUM A-D) 2 MG tablet, Take 2 mg by mouth 4 (four) times daily as needed for diarrhea or loose stools., Disp: , Rfl:    magnesium oxide (MAG-OX) 400 MG tablet, Take by mouth., Disp: , Rfl:    Melatonin 10 MG TABS, Take 10 mg by mouth at bedtime as needed (sleep)., Disp: , Rfl:    Menthol, Topical Analgesic, (ICY HOT MEDICATED SPRAY EX), Apply 1 application topically as needed (shoulder pain)., Disp: , Rfl:    metoprolol tartrate (LOPRESSOR) 50 MG tablet, TAKE 1 TABLET BY MOUTH TWICE DAILY, Disp: 180 tablet, Rfl: 2   Multiple Vitamins-Minerals (MULTIVITAMIN ADULT PO), Take 1 tablet by mouth daily., Disp: , Rfl:    MYRBETRIQ 50 MG TB24 tablet, Take 50 mg by mouth daily., Disp: , Rfl:    nystatin (MYCOSTATIN/NYSTOP) powder, Apply 1 Application topically 3 (three) times daily., Disp: 30 g, Rfl: 3   nystatin cream (MYCOSTATIN), Apply 1 Application topically 3 (three) times daily. To skin folds, Disp: 30 g, Rfl: 3   omega-3 acid ethyl esters (LOVAZA) 1 g capsule, TAKE ONE CAPSULE BY MOUTH EVERY DAY, Disp: 30 capsule, Rfl: 0   ondansetron (ZOFRAN) 4 MG tablet, Take 1 tablet (4 mg total) by mouth every 8 (eight) hours as needed for nausea or vomiting., Disp: 20 tablet, Rfl: 0   oxyCODONE-acetaminophen (PERCOCET) 10-325 MG tablet, Take 1 tablet by mouth every 4 (four) hours as needed for pain., Disp: 180 tablet, Rfl: 0   polyvinyl alcohol (LIQUIFILM TEARS) 1.4 % ophthalmic solution, Place 1 drop into both eyes 4 (four) times daily as needed (for dry/irritated eyes). , Disp: ,  Rfl:    potassium chloride (MICRO-K) 10  MEQ CR capsule, TAKE 2 CAPSULES(20 MEQ) BY MOUTH TWICE DAILY WHEN AS NEEDED WHEN TAKING A DOSE OF LASIX, Disp: 60 capsule, Rfl: 0   promethazine (PHENERGAN) 12.5 MG tablet, as needed., Disp: , Rfl:    Semaglutide, 2 MG/DOSE, 8 MG/3ML SOPN, Inject 2 mg as directed once a week., Disp: 3 mL, Rfl: 3   sodium chloride (OCEAN) 0.65 % nasal spray, Place 2 sprays into the nose as needed for congestion., Disp: 30 mL, Rfl: 1   tiotropium (SPIRIVA) 18 MCG inhalation capsule, Place 18 mcg into inhaler and inhale as needed., Disp: , Rfl:    tiZANidine (ZANAFLEX) 2 MG tablet, TAKE 1 TABLET(2 MG) BY MOUTH THREE TIMES DAILY, Disp: 270 tablet, Rfl: 1   triamterene-hydrochlorothiazide (MAXZIDE-25) 37.5-25 MG tablet, TAKE 1 TABLET BY MOUTH DAILY, Disp: 90 tablet, Rfl: 3   venlafaxine XR (EFFEXOR-XR) 75 MG 24 hr capsule, TAKE 1 CAPSULE BY MOUTH EVERY DAY WITH BREAKFAST, Disp: 90 capsule, Rfl: 1   Vitamin D, Ergocalciferol, (DRISDOL) 1.25 MG (50000 UNIT) CAPS capsule, Take 1 capsule (50,000 Units total) by mouth every 7 (seven) days., Disp: 20 capsule, Rfl: 0   XELJANZ 5 MG TABS, TAKE 1 TABLET BY MOUTH DAILY, Disp: 60 tablet, Rfl: 0   augmented betamethasone dipropionate (DIPROLENE-AF) 0.05 % cream, Apply topically 2 (two) times daily as needed. (Patient not taking: Reported on 06/09/2023), Disp: 30 g, Rfl: 3   calamine lotion, Apply 1 Application topically 3 (three) times daily. To affected areas on right fore arm (Patient not taking: Reported on 06/26/2023), Disp: 120 mL, Rfl: 0   Physical Exam:   Ht 5\' 6"  (1.676 m)   Wt 206 lb (93.4 kg)   BMI 33.25 kg/m   Salient findings:  CN II-XII intact  Bilateral EAC clear and TM intact with well pneumatized middle ear spaces; Lt ear T-tube in place, patent, dry. Some cerumen surrounding Weber 512: midline - inconsistent Rinne 512 - AC>BC Anterior rhinoscopy: Septum relatively midline; bilateral inferior turbinates without significant hypertrophy No lesions of oral  cavity/oropharynx; dentition intact No obviously palpable neck masses/lymphadenopathy/thyromegaly No respiratory distress or stridor; voice quality class 2  Seprately Identifiable Procedures:  Procedure Note Pre-procedure diagnosis:  Dysphonia Post-procedure diagnosis: Same Procedure: Transnasal Fiberoptic Laryngoscopy, CPT 84132 - Mod 25 Indication: dysphonia Complications: None apparent EBL: 0 mL Date: 06/26/23   The procedure was undertaken to further evaluate the patient's complaint of dysphonia, with mirror exam inadequate for appropriate examination due to gag reflex and poor patient tolerance  Procedure:  Patient was identified as correct patient. Verbal consent was obtained. The nose was sprayed with oxymetazoline and 4% lidocaine. The The flexible laryngoscope was passed through the nose to view the nasal cavity, pharynx (oropharynx, hypopharynx) and larynx.  The larynx was examined at rest and during multiple phonatory tasks. Documentation was obtained and reviewed with patient. The scope was removed. The patient tolerated the procedure well.  Findings: The nasal cavity and nasopharynx did not reveal any masses or lesions, mucosa appeared to be without obvious lesions. The tongue base, pharyngeal walls, piriform sinuses, vallecula, epiglottis and postcricoid region are normal in appearance without significant retained secretions. The visualized portion of the subglottis and proximal trachea is widely patent. The vocal folds are mobile bilaterally. There are no lesions on the free edge of the vocal folds nor elsewhere in the larynx worrisome for malignancy. Age appropriate VF atrophy and AP compression of supraglottis consistent with MTD  Electronically signed by: Read Drivers, MD 06/26/2023 11:03 AM   Impression & Plans:  Clarrisa Kaylor is a 82 y.o. female with: Dysphonia Age appropriate VF atrophy Muscle tension dysphonia  We discussed her options. TFL is reassuring and she was  pleased with this. She is having some GERD sx and I advised we can try conservative management v/s pharmacologic Rx - she opted for trial of Pantoprazole and Gaviscon (see below). I think she would do well with Speech eval but she would like to defer. F/u PRN, in case of persistent symptoms, or if she'd like to try SLP therapy. Given reassuring TFL, she agreed  We also discussed monitoring her hearing - she would like to wait for now since this has not changed.  See below regarding exact medications prescribed this encounter including dosages and route: Meds ordered this encounter  Medications   pantoprazole (PROTONIX) 40 MG tablet    Sig: Take 1 tablet (40 mg total) by mouth daily.    Dispense:  30 tablet    Refill:  3    Thank you for allowing me the opportunity to care for your patient. Please do not hesitate to contact me should you have any other questions.  Sincerely, Jovita Kussmaul, MD Otolarynoglogist (ENT), [facility] Phone: [phone] Fax: [phone]  06/26/2023, 10:46 AM   MDM:  Level 4 Data complexity: independent review of CT and imaging - Morbidity: mod  - Prescription Drug prescribed or managed: yes

## 2023-06-27 ENCOUNTER — Ambulatory Visit: Payer: Medicare Other | Attending: Physician Assistant | Admitting: Physician Assistant

## 2023-06-27 ENCOUNTER — Encounter: Payer: Self-pay | Admitting: Physician Assistant

## 2023-06-27 VITALS — BP 160/71 | HR 71 | Resp 14 | Ht 66.0 in | Wt 206.0 lb

## 2023-06-27 DIAGNOSIS — Z8709 Personal history of other diseases of the respiratory system: Secondary | ICD-10-CM

## 2023-06-27 DIAGNOSIS — Z8719 Personal history of other diseases of the digestive system: Secondary | ICD-10-CM

## 2023-06-27 DIAGNOSIS — Z8639 Personal history of other endocrine, nutritional and metabolic disease: Secondary | ICD-10-CM | POA: Diagnosis not present

## 2023-06-27 DIAGNOSIS — M170 Bilateral primary osteoarthritis of knee: Secondary | ICD-10-CM | POA: Diagnosis not present

## 2023-06-27 DIAGNOSIS — M810 Age-related osteoporosis without current pathological fracture: Secondary | ICD-10-CM

## 2023-06-27 DIAGNOSIS — M3501 Sicca syndrome with keratoconjunctivitis: Secondary | ICD-10-CM

## 2023-06-27 DIAGNOSIS — Z8669 Personal history of other diseases of the nervous system and sense organs: Secondary | ICD-10-CM

## 2023-06-27 DIAGNOSIS — Z8659 Personal history of other mental and behavioral disorders: Secondary | ICD-10-CM

## 2023-06-27 DIAGNOSIS — B029 Zoster without complications: Secondary | ICD-10-CM | POA: Diagnosis not present

## 2023-06-27 DIAGNOSIS — G4709 Other insomnia: Secondary | ICD-10-CM | POA: Diagnosis not present

## 2023-06-27 DIAGNOSIS — M0579 Rheumatoid arthritis with rheumatoid factor of multiple sites without organ or systems involvement: Secondary | ICD-10-CM

## 2023-06-27 DIAGNOSIS — Z79899 Other long term (current) drug therapy: Secondary | ICD-10-CM

## 2023-06-27 DIAGNOSIS — M797 Fibromyalgia: Secondary | ICD-10-CM | POA: Diagnosis not present

## 2023-06-27 DIAGNOSIS — Z8679 Personal history of other diseases of the circulatory system: Secondary | ICD-10-CM

## 2023-06-27 DIAGNOSIS — M51369 Other intervertebral disc degeneration, lumbar region without mention of lumbar back pain or lower extremity pain: Secondary | ICD-10-CM | POA: Diagnosis not present

## 2023-06-27 NOTE — Progress Notes (Signed)
CBC stable

## 2023-06-27 NOTE — Patient Instructions (Signed)

## 2023-06-28 LAB — COMPLETE METABOLIC PANEL WITH GFR
AG Ratio: 1.3 (calc) (ref 1.0–2.5)
ALT: 32 U/L — ABNORMAL HIGH (ref 6–29)
AST: 30 U/L (ref 10–35)
Albumin: 3.6 g/dL (ref 3.6–5.1)
Alkaline phosphatase (APISO): 122 U/L (ref 37–153)
BUN: 12 mg/dL (ref 7–25)
CO2: 32 mmol/L (ref 20–32)
Calcium: 9.4 mg/dL (ref 8.6–10.4)
Chloride: 104 mmol/L (ref 98–110)
Creat: 0.76 mg/dL (ref 0.60–0.95)
Globulin: 2.7 g/dL (ref 1.9–3.7)
Glucose, Bld: 46 mg/dL — ABNORMAL LOW (ref 65–99)
Potassium: 3.9 mmol/L (ref 3.5–5.3)
Sodium: 144 mmol/L (ref 135–146)
Total Bilirubin: 0.3 mg/dL (ref 0.2–1.2)
Total Protein: 6.3 g/dL (ref 6.1–8.1)
eGFR: 78 mL/min/{1.73_m2} (ref >=60)

## 2023-06-28 LAB — CBC WITH DIFFERENTIAL/PLATELET
Absolute Lymphocytes: 2029 {cells}/uL (ref 850–3900)
Absolute Monocytes: 1066 {cells}/uL — ABNORMAL HIGH (ref 200–950)
Basophils Absolute: 117 {cells}/uL (ref 0–200)
Basophils Relative: 0.8 %
Eosinophils Absolute: 467 {cells}/uL (ref 15–500)
Eosinophils Relative: 3.2 %
HCT: 44.3 % (ref 35.0–45.0)
Hemoglobin: 14 g/dL (ref 11.7–15.5)
MCH: 27.5 pg (ref 27.0–33.0)
MCHC: 31.6 g/dL — ABNORMAL LOW (ref 32.0–36.0)
MCV: 86.9 fL (ref 80.0–100.0)
MPV: 10.6 fL (ref 7.5–12.5)
Monocytes Relative: 7.3 %
Neutro Abs: 10921 {cells}/uL — ABNORMAL HIGH (ref 1500–7800)
Neutrophils Relative %: 74.8 %
Platelets: 331 10*3/uL (ref 140–400)
RBC: 5.1 10*6/uL (ref 3.80–5.10)
RDW: 13.2 % (ref 11.0–15.0)
Total Lymphocyte: 13.9 %
WBC: 14.6 10*3/uL — ABNORMAL HIGH (ref 3.8–10.8)

## 2023-06-28 NOTE — Progress Notes (Signed)
ALT is borderline elevated-32. Avoid tylenol and alcohol use. Glucose is low-46-patient fasting? Rest of CMP WNL.

## 2023-06-29 ENCOUNTER — Other Ambulatory Visit: Payer: Self-pay

## 2023-06-29 DIAGNOSIS — Z79899 Other long term (current) drug therapy: Secondary | ICD-10-CM

## 2023-06-29 DIAGNOSIS — M0579 Rheumatoid arthritis with rheumatoid factor of multiple sites without organ or systems involvement: Secondary | ICD-10-CM

## 2023-06-29 MED ORDER — XELJANZ 5 MG PO TABS: 1.0000 | ORAL_TABLET | Freq: Every day | ORAL | 0 refills | Status: DC

## 2023-06-29 NOTE — Telephone Encounter (Signed)
Please review and sign pended xeljanz refill (for pharmacy to have on file for refill). This was discussed at patient's appointment on 06/27/2023. Thanks!

## 2023-07-11 NOTE — Progress Notes (Deleted)
Subjective:    Patient ID: Katherine Delgado, female    DOB: 05-Jan-1941, 82 y.o.   MRN: [id number]  HPI   Pain Inventory Average Pain {NUMBERS; 0-10:5044} Pain Right Now {NUMBERS; 0-10:5044} My pain is {PAIN DESCRIPTION:21022940}  In the last 24 hours, has pain interfered with the following? General activity {NUMBERS; 0-10:5044} Relation with others {NUMBERS; 0-10:5044} Enjoyment of life {NUMBERS; 0-10:5044} What TIME of day is your pain at its worst? {time of day:24191} Sleep (in general) {BHH GOOD/FAIR/POOR:22877}  Pain is worse with: {ACTIVITIES:21022942} Pain improves with: {PAIN IMPROVES OZDG:64403474} Relief from Meds: {NUMBERS; 0-10:5044}  Family History  Problem Relation Age of Onset   Alzheimer's disease Mother    Heart disease Mother    Heart disease Father    Liver disease Father    Cancer Brother    Colon polyps Brother    Arthritis Son    Colon cancer Neg Hx    Esophageal cancer Neg Hx    Kidney disease Neg Hx    Stomach cancer Neg Hx    Rectal cancer Neg Hx    Social History   Socioeconomic History   Marital status: Widowed    Spouse name: Not on file   Number of children: 2   Years of education: 14   Highest education level: Not on file  Occupational History   Occupation: Retired  Tobacco Use   Smoking status: Former    Current packs/day: 0.00    Average packs/day: 0.1 packs/day for 10.0 years (1.0 ttl pk-yrs)    Types: Cigarettes    Start date: 12/06/1969    Quit date: 12/07/1979    Years since quitting: 43.6    Passive exposure: Current   Smokeless tobacco: Never  Vaping Use   Vaping status: Never Used  Substance and Sexual Activity   Alcohol use: No    Alcohol/week: 0.0 standard drinks of alcohol   Drug use: No   Sexual activity: Not Currently  Other Topics Concern   Not on file  Social History Narrative   Walks with cane   Right handed    Caffeine use: Coffee (2 cups every morning)   Tea: sometimes   Soda: none   Social  Determinants of Health   Financial Resource Strain: Medium Risk (10/05/2017)   Overall Financial Resource Strain (CARDIA)    Difficulty of Paying Living Expenses: Somewhat hard  Food Insecurity: No Food Insecurity (10/25/2022)   Hunger Vital Sign    Worried About Running Out of Food in the Last Year: Never true    Ran Out of Food in the Last Year: Never true  Transportation Needs: No Transportation Needs (10/25/2022)   PRAPARE - Administrator, Civil Service (Medical): No    Lack of Transportation (Non-Medical): No  Physical Activity: Inactive (10/05/2017)   Exercise Vital Sign    Days of Exercise per Week: 0 days    Minutes of Exercise per Session: 0 min  Stress: Stress Concern Present (10/05/2017)   [employer] of Occupational Health - Occupational Stress Questionnaire    Feeling of Stress : Rather much  Social Connections: Moderately Isolated (10/05/2017)   Social Connection and Isolation Panel [NHANES]    Frequency of Communication with Friends and Family: More than three times a week    Frequency of Social Gatherings with Friends and Family: More than three times a week    Attends Religious Services: Never    Database administrator or Organizations: No    Attends  Club or Organization Meetings: Never    Marital Status: Widowed   Past Surgical History:  Procedure Laterality Date   ABDOMINAL HYSTERECTOMY  1974   abdominal tumor  2002   APPENDECTOMY     APPLICATION OF A-CELL OF BACK N/A 09/26/2018   Procedure: With Acell;  Surgeon: Peggye Form, DO;  [facility];  Service: Plastics;  Laterality: N/A;   APPLICATION OF WOUND VAC N/A 09/26/2018   Procedure: Vac placement;  Surgeon: Peggye Form, DO;  [facility];  Service: Plastics;  Laterality: N/A;   BACK SURGERY  1982   BRONCHOSCOPY  2001   CATARACT EXTRACTION, BILATERAL  09/2019, 10/2019   CHOLECYSTECTOMY  1984   INCISION AND DRAINAGE OF WOUND N/A 09/26/2018   Procedure: Debridement of spine  wound;  Surgeon: Peggye Form, DO;  [facility];  Service: Plastics;  Laterality: N/A;   KNEE SURGERY Bilateral 08/27/2010 (L) and 01/11/2011 (R)   LUMBAR LAMINECTOMY/DECOMPRESSION MICRODISCECTOMY N/A 07/03/2018   Procedure: Lumbar Three to Lumbar Five Laminectomy;  Surgeon: Jadene Pierini, MD;  [facility];  Service: Neurosurgery;  Laterality: N/A;   LUMBAR WOUND DEBRIDEMENT N/A 08/20/2018   Procedure: POSTERIOR LUMBAR SPINAL WOUND DEBRIDEMENT AND REVISION;  Surgeon: Jadene Pierini, MD;  [facility];  Service: Neurosurgery;  Laterality: N/A;  POSTERIOR LUMBAR SPINAL WOUND REVISION   miscarrage  1962   OVARIAN CYST SURGERY  1968   thymus tumor     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   TONSILLECTOMY     Past Surgical History:  Procedure Laterality Date   ABDOMINAL HYSTERECTOMY  1974   abdominal tumor  2002   APPENDECTOMY     APPLICATION OF A-CELL OF BACK N/A 09/26/2018   Procedure: With Acell;  Surgeon: Peggye Form, DO;  [facility];  Service: Plastics;  Laterality: N/A;   APPLICATION OF WOUND VAC N/A 09/26/2018   Procedure: Vac placement;  Surgeon: Peggye Form, DO;  [facility];  Service: Plastics;  Laterality: N/A;   BACK SURGERY  1982   BRONCHOSCOPY  2001   CATARACT EXTRACTION, BILATERAL  09/2019, 10/2019   CHOLECYSTECTOMY  1984   INCISION AND DRAINAGE OF WOUND N/A 09/26/2018   Procedure: Debridement of spine wound;  Surgeon: Peggye Form, DO;  [facility];  Service: Plastics;  Laterality: N/A;   KNEE SURGERY Bilateral 08/27/2010 (L) and 01/11/2011 (R)   LUMBAR LAMINECTOMY/DECOMPRESSION MICRODISCECTOMY N/A 07/03/2018   Procedure: Lumbar Three to Lumbar Five Laminectomy;  Surgeon: Jadene Pierini, MD;  [facility];  Service: Neurosurgery;  Laterality: N/A;   LUMBAR WOUND DEBRIDEMENT N/A 08/20/2018   Procedure: POSTERIOR LUMBAR SPINAL WOUND DEBRIDEMENT AND REVISION;  Surgeon: Jadene Pierini, MD;  [facility];  Service:  Neurosurgery;  Laterality: N/A;  POSTERIOR LUMBAR SPINAL WOUND REVISION   miscarrage  1962   OVARIAN CYST SURGERY  1968   thymus tumor     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   TONSILLECTOMY     Past Medical History:  Diagnosis Date   Abnormality of gait 04/19/2016   Acute infective polyneuritis (HCC) 2002   Allergic rhinitis due to pollen    Chronic pain syndrome    COPD (chronic obstructive pulmonary disease) (HCC)    chronic bronchitis   Depressive disorder, not elsewhere classified    Diabetes mellitus without complication (HCC)    Diaphragmatic hernia without mention of obstruction or gangrene    Dyslipidemia    Fibromyalgia  GERD (gastroesophageal reflux disease)    with h/o esophagitis   Guillain-Barre syndrome (HCC)    History of benign thymus tumor    Insomnia, unspecified    Lumbar spinal stenosis 03/30/2018   L4-5 level, severe   Miscarriage 1962   Mixed hyperlipidemia    Morbid obesity (HCC)    Osteoporosis    Pneumonia    Polyneuropathy in diabetes(357.2)    Rheumatoid arthritis(714.0)    Spondylosis, lumbosacral    Spontaneous ecchymoses    Type II or unspecified type diabetes mellitus with peripheral circulatory disorders, uncontrolled(250.72)    Unspecified chronic bronchitis (HCC)    Unspecified essential hypertension    Unspecified hypothyroidism    Unspecified pruritic disorder    Unspecified urinary incontinence    There were no vitals taken for this visit.  Opioid Risk Score:   Fall Risk Score:  `1  Depression screen [facility] 2/9     06/09/2023    9:28 AM 06/09/2023    9:23 AM 04/21/2023    8:26 AM 03/24/2023    8:51 AM 03/07/2023    8:36 AM 02/24/2023    9:31 AM 01/24/2023   12:54 PM  Depression screen PHQ 2/9  Decreased Interest 0 0 0 0 0 0 0  Down, Depressed, Hopeless 0 0 0 0 0 0 0  PHQ - 2 Score 0 0 0 0 0 0 0    Review of Systems     Objective:   Physical Exam        Assessment & Plan:

## 2023-07-12 ENCOUNTER — Encounter: Payer: Self-pay | Admitting: Adult Health

## 2023-07-12 ENCOUNTER — Ambulatory Visit (INDEPENDENT_AMBULATORY_CARE_PROVIDER_SITE_OTHER): Payer: Medicare Other | Admitting: Adult Health

## 2023-07-12 VITALS — BP 146/82 | HR 90 | Temp 97.9°F | Resp 19 | Ht 66.0 in | Wt 207.8 lb

## 2023-07-12 DIAGNOSIS — N390 Urinary tract infection, site not specified: Secondary | ICD-10-CM

## 2023-07-12 DIAGNOSIS — Z794 Long term (current) use of insulin: Secondary | ICD-10-CM | POA: Diagnosis not present

## 2023-07-12 DIAGNOSIS — B372 Candidiasis of skin and nail: Secondary | ICD-10-CM

## 2023-07-12 DIAGNOSIS — M05731 Rheumatoid arthritis with rheumatoid factor of right wrist without organ or systems involvement: Secondary | ICD-10-CM

## 2023-07-12 DIAGNOSIS — E1142 Type 2 diabetes mellitus with diabetic polyneuropathy: Secondary | ICD-10-CM

## 2023-07-12 DIAGNOSIS — R350 Frequency of micturition: Secondary | ICD-10-CM

## 2023-07-12 LAB — POCT URINALYSIS DIPSTICK (MANUAL)
Nitrite, UA: NEGATIVE
Poct Bilirubin: NEGATIVE
Poct Glucose: NORMAL mg/dL
Poct Urobilinogen: NORMAL mg/dL
Spec Grav, UA: 1.01 (ref 1.010–1.025)
pH, UA: 6 (ref 5.0–8.0)

## 2023-07-12 MED ORDER — CIPROFLOXACIN HCL 500 MG PO TABS: 500.0000 mg | ORAL_TABLET | Freq: Two times a day (BID) | ORAL | 0 refills | Status: DC

## 2023-07-12 MED ORDER — NYSTATIN 100000 UNIT/GM EX OINT: 1.0000 | TOPICAL_OINTMENT | Freq: Two times a day (BID) | CUTANEOUS | 0 refills | Status: DC

## 2023-07-12 MED ORDER — NYSTATIN 100000 UNIT/GM EX CREA: 1.0000 | TOPICAL_CREAM | Freq: Three times a day (TID) | CUTANEOUS | 3 refills | Status: DC

## 2023-07-12 MED ORDER — FLUCONAZOLE 150 MG PO TABS: ORAL_TABLET | ORAL | 0 refills | Status: DC

## 2023-07-12 NOTE — Progress Notes (Signed)
[facility]  Provider:  Kenard Gower DNP  Code Status:  Full Code  Goals of Care:     07/12/2023   10:59 AM  Advanced Directives  Does Patient Have a Medical Advance Directive? No  Would patient like information on creating a medical advance directive? No - Patient declined     Chief Complaint  Patient presents with   Acute Visit    Yeast infection and possible uti.    HPI: Patient is a 82 y.o. female seen today for an acute visit for possible UTI and yeast infection. She started having rashes on her bilateral groin and under bilateral breasts X 4 weeks. She complained of itching and burning of the rashes.   She has dysuria, increase in urinary frequency, no hematuria, urine "smells bad", has small flakes coming out.  Urine dipstick showed moderate blood, positive protein, negative nitrate and moderate leukocyte   Past Medical History:  Diagnosis Date   Abnormality of gait 04/19/2016   Acute infective polyneuritis (HCC) 2002   Allergic rhinitis due to pollen    Chronic pain syndrome    COPD (chronic obstructive pulmonary disease) (HCC)    chronic bronchitis   Depressive disorder, not elsewhere classified    Diabetes mellitus without complication (HCC)    Diaphragmatic hernia without mention of obstruction or gangrene    Dyslipidemia    Fibromyalgia    GERD (gastroesophageal reflux disease)    with h/o esophagitis   Guillain-Barre syndrome (HCC)    History of benign thymus tumor    Insomnia, unspecified    Lumbar spinal stenosis 03/30/2018   L4-5 level, severe   Miscarriage 1962   Mixed hyperlipidemia    Morbid obesity (HCC)    Osteoporosis    Pneumonia    Polyneuropathy in diabetes(357.2)    Rheumatoid arthritis(714.0)    Spondylosis, lumbosacral    Spontaneous ecchymoses    Type II or unspecified type diabetes mellitus with peripheral circulatory disorders, uncontrolled(250.72)    Unspecified chronic bronchitis (HCC)    Unspecified essential  hypertension    Unspecified hypothyroidism    Unspecified pruritic disorder    Unspecified urinary incontinence     Past Surgical History:  Procedure Laterality Date   ABDOMINAL HYSTERECTOMY  1974   abdominal tumor  2002   APPENDECTOMY     APPLICATION OF A-CELL OF BACK N/A 09/26/2018   Procedure: With Acell;  Surgeon: Peggye Form, DO;  Location: [facility] OR;  Service: Plastics;  Laterality: N/A;   APPLICATION OF WOUND VAC N/A 09/26/2018   Procedure: Vac placement;  Surgeon: Peggye Form, DO;  Location: [facility] OR;  Service: Plastics;  Laterality: N/A;   BACK SURGERY  1982   BRONCHOSCOPY  2001   CATARACT EXTRACTION, BILATERAL  09/2019, 10/2019   CHOLECYSTECTOMY  1984   INCISION AND DRAINAGE OF WOUND N/A 09/26/2018   Procedure: Debridement of spine wound;  Surgeon: Peggye Form, DO;  Location: [facility] OR;  Service: Plastics;  Laterality: N/A;   KNEE SURGERY Bilateral 08/27/2010 (L) and 01/11/2011 (R)   LUMBAR LAMINECTOMY/DECOMPRESSION MICRODISCECTOMY N/A 07/03/2018   Procedure: Lumbar Three to Lumbar Five Laminectomy;  Surgeon: Jadene Pierini, MD;  Location: [facility] OR;  Service: Neurosurgery;  Laterality: N/A;   LUMBAR WOUND DEBRIDEMENT N/A 08/20/2018   Procedure: POSTERIOR LUMBAR SPINAL WOUND DEBRIDEMENT AND REVISION;  Surgeon: Jadene Pierini, MD;  Location: [facility] OR;  Service: Neurosurgery;  Laterality: N/A;  POSTERIOR LUMBAR SPINAL WOUND REVISION   miscarrage  [phone]  OVARIAN CYST SURGERY  1968   thymus tumor     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   TONSILLECTOMY      Allergies  Allergen Reactions   Influenza Vaccines Other (See Comments)    "h/o Guillain Barre; dr's told me years ago never to take another flu shot as it could relapse Alene Mires; has to do with when vaccine being changed to H1N1 virus"   Penicillins Hives, Itching, Swelling and Rash    Has patient had a PCN reaction causing immediate rash, facial/tongue/throat swelling, SOB or lightheadedness with  hypotension:Unknown Has patient had a PCN reaction causing severe rash involving mucus membranes or skin necrosis: Unknown Has patient had a PCN reaction that required hospitalization:Unknown Has patient had a PCN reaction occurring within the last 10 years: Unknown If all of the above answers are "NO", then may proceed with Cephalosporin use.    Statins Other (See Comments)    Myopathy, transaminitis   Sulfamethoxazole-Trimethoprim Itching    Outpatient Encounter Medications as of 07/12/2023  Medication Sig   amitriptyline (ELAVIL) 50 MG tablet Take 1 tablet (50 mg total) by mouth at bedtime. (Patient taking differently: Take 50 mg by mouth at bedtime as needed.)   amLODipine (NORVASC) 10 MG tablet TAKE 1 TABLET(10 MG) BY MOUTH DAILY   aspirin EC 81 MG tablet Take 81 mg by mouth 2 (two) times a week.   augmented betamethasone dipropionate (DIPROLENE-AF) 0.05 % cream Apply topically 2 (two) times daily as needed.   cholecalciferol (VITAMIN D3) 25 MCG (1000 UT) tablet Take 2,000 Units by mouth daily.   Continuous Glucose Sensor (DEXCOM G7 SENSOR) MISC 1 Device by Does not apply route as directed.   Cranberry 475 MG CAPS Take 1 capsule (475 mg total) by mouth 2 (two) times daily.   docusate sodium (COLACE) 100 MG capsule Take 100 mg by mouth daily as needed for mild constipation.   DULoxetine (CYMBALTA) 20 MG capsule TAKE 1 CAPSULE(20 MG) BY MOUTH DAILY   ezetimibe (ZETIA) 10 MG tablet Take 1 tablet (10 mg total) by mouth daily.   fluconazole (DIFLUCAN) 150 MG tablet Repeat in 72 hours   fluticasone (CUTIVATE) 0.05 % cream Apply topically 2 (two) times daily as needed.   fluticasone (FLONASE) 50 MCG/ACT nasal spray SHAKE LIQUID AND USE 2 SPRAYS IN EACH NOSTRIL AT BEDTIME   folic acid (FOLVITE) 1 MG tablet Take 2 tablets (2 mg total) by mouth daily.   furosemide (LASIX) 20 MG tablet TAKE 1 BY MOUTH TWICE DAILY FOR 3 DAYS AS NEEDED FOR 3 POUND WEIGHT GAIN IN ONE DAY OR 5 POUNDS IN ONE WEEK    gabapentin (NEURONTIN) 600 MG tablet TAKE 1 TABLET(600 MG) BY MOUTH THREE TIMES DAILY   insulin glargine, 1 Unit Dial, (TOUJEO SOLOSTAR) 300 UNIT/ML Solostar Pen 40 units QAM and 40 units  QPM   insulin lispro (HUMALOG KWIKPEN) 200 UNIT/ML KwikPen Max daily 30 units   Insulin Pen Needle (B-D UF III MINI PEN NEEDLES) 31G X 5 MM MISC 1 Device by Other route in the morning, at noon, in the evening, and at bedtime.   ipratropium-albuterol (DUONEB) 0.5-2.5 (3) MG/3ML SOLN Take 3 mLs by nebulization every 6 (six) hours as needed.   ketoconazole (NIZORAL) 2 % cream Apply 1 Application topically 2 (two) times daily. (Patient taking differently: Apply 1 Application topically.)   levothyroxine (SYNTHROID) 100 MCG tablet Take 1 tablet (100 mcg total) by mouth daily before breakfast.   lidocaine (LIDODERM) 5 % PLACE 1  PATCH ONTO THE SKIN DAILY AS DIRECTED, REMOVE AND DISCARD PATCH WITHN 12 HOURS OR AS DIRECTED BY MD   lisinopril (ZESTRIL) 20 MG tablet TAKE 1 TABLET BY MOUTH EVERY DAY   loperamide (IMODIUM A-D) 2 MG tablet Take 2 mg by mouth 4 (four) times daily as needed for diarrhea or loose stools.   Melatonin 10 MG TABS Take 10 mg by mouth at bedtime as needed (sleep).   Menthol, Topical Analgesic, (ICY HOT MEDICATED SPRAY EX) Apply 1 application topically as needed (shoulder pain).   metoprolol tartrate (LOPRESSOR) 50 MG tablet TAKE 1 TABLET BY MOUTH TWICE DAILY   Multiple Vitamins-Minerals (MULTIVITAMIN ADULT PO) Take 1 tablet by mouth daily.   MYRBETRIQ 50 MG TB24 tablet Take 50 mg by mouth daily.   nystatin (MYCOSTATIN/NYSTOP) powder Apply 1 Application topically 3 (three) times daily.   nystatin cream (MYCOSTATIN) Apply 1 Application topically 3 (three) times daily. To skin folds   omega-3 acid ethyl esters (LOVAZA) 1 g capsule TAKE ONE CAPSULE BY MOUTH EVERY DAY   ondansetron (ZOFRAN) 4 MG tablet Take 1 tablet (4 mg total) by mouth every 8 (eight) hours as needed for nausea or vomiting.    oxyCODONE-acetaminophen (PERCOCET) 10-325 MG tablet Take 1 tablet by mouth every 4 (four) hours as needed for pain.   polyvinyl alcohol (LIQUIFILM TEARS) 1.4 % ophthalmic solution Place 1 drop into both eyes 4 (four) times daily as needed (for dry/irritated eyes).    Semaglutide, 2 MG/DOSE, 8 MG/3ML SOPN Inject 2 mg as directed once a week.   sodium chloride (OCEAN) 0.65 % nasal spray Place 2 sprays into the nose as needed for congestion.   tiotropium (SPIRIVA) 18 MCG inhalation capsule Place 18 mcg into inhaler and inhale as needed.   tiZANidine (ZANAFLEX) 2 MG tablet TAKE 1 TABLET(2 MG) BY MOUTH THREE TIMES DAILY   Tofacitinib Citrate (XELJANZ) 5 MG TABS Take 1 tablet (5 mg total) by mouth daily.   triamterene-hydrochlorothiazide (MAXZIDE-25) 37.5-25 MG tablet TAKE 1 TABLET BY MOUTH DAILY   venlafaxine XR (EFFEXOR-XR) 75 MG 24 hr capsule TAKE 1 CAPSULE BY MOUTH EVERY DAY WITH BREAKFAST   [DISCONTINUED] fluconazole (DIFLUCAN) 150 MG tablet Take 1 tablet (150 mg total) by mouth daily.   calamine lotion Apply 1 Application topically 3 (three) times daily. To affected areas on right fore arm (Patient not taking: Reported on 06/26/2023)   magnesium oxide (MAG-OX) 400 MG tablet Take by mouth. (Patient not taking: Reported on 07/12/2023)   pantoprazole (PROTONIX) 40 MG tablet Take 1 tablet (40 mg total) by mouth daily. (Patient not taking: Reported on 07/12/2023)   potassium chloride (MICRO-K) 10 MEQ CR capsule TAKE 2 CAPSULES(20 MEQ) BY MOUTH TWICE DAILY WHEN AS NEEDED WHEN TAKING A DOSE OF LASIX (Patient not taking: Reported on 07/12/2023)   promethazine (PHENERGAN) 12.5 MG tablet as needed. (Patient not taking: Reported on 07/12/2023)   Vitamin D, Ergocalciferol, (DRISDOL) 1.25 MG (50000 UNIT) CAPS capsule Take 1 capsule (50,000 Units total) by mouth every 7 (seven) days. (Patient not taking: Reported on 06/27/2023)   No facility-administered encounter medications on file as of 07/12/2023.    Review  of Systems:  Review of Systems  Constitutional:  Negative for activity change, appetite change, chills, fatigue and fever.  HENT:  Negative for congestion, hearing loss, rhinorrhea and sore throat.   Eyes: Negative.   Respiratory:  Negative for cough, shortness of breath and wheezing.   Cardiovascular:  Negative for chest pain, palpitations and leg swelling.  Gastrointestinal:  Negative for abdominal distention, abdominal pain, constipation, diarrhea, nausea and vomiting.  Genitourinary:  Positive for frequency and urgency. Negative for difficulty urinating, dysuria and flank pain.  Musculoskeletal:  Negative for arthralgias, back pain and myalgias.  Skin:  Positive for rash. Negative for color change and wound.  Neurological:  Negative for dizziness, weakness and headaches.  Psychiatric/Behavioral:  Negative for behavioral problems. The patient is not nervous/anxious.     Health Maintenance  Topic Date Due   Zoster Vaccines- Shingrix (1 of 2) Never done   COVID-19 Vaccine (3 - Pfizer risk series) 12/28/2019   Diabetic kidney evaluation - Urine ACR  09/02/2022   HEMOGLOBIN A1C  10/04/2023   Medicare Annual Wellness (AWV)  03/06/2024   FOOT EXAM  03/07/2024   OPHTHALMOLOGY EXAM  06/11/2024   Diabetic kidney evaluation - eGFR measurement  06/26/2024   DEXA SCAN  09/20/2025   DTaP/Tdap/Td (2 - Td or Tdap) 05/28/2026   Pneumonia Vaccine 101+ Years old  Completed   HPV VACCINES  Aged Out   INFLUENZA VACCINE  Discontinued   Hepatitis C Screening  Discontinued    Physical Exam: Vitals:   07/12/23 1114 07/12/23 1118  BP: (!) 140/88 (!) 146/82  Pulse: 90   Resp: 19   Temp: 97.9 F (36.6 C)   SpO2: 92%   Weight: 207 lb 12.8 oz (94.3 kg)   Height: 5\' 6"  (1.676 m)    Body mass index is 33.54 kg/m. Physical Exam Constitutional:      Appearance: She is obese.  HENT:     Head: Normocephalic and atraumatic.     Nose: Nose normal.     Mouth/Throat:     Mouth: Mucous membranes are  moist.  Eyes:     Conjunctiva/sclera: Conjunctivae normal.  Cardiovascular:     Rate and Rhythm: Normal rate and regular rhythm.  Pulmonary:     Effort: Pulmonary effort is normal.     Breath sounds: Normal breath sounds.  Abdominal:     General: Bowel sounds are normal.     Palpations: Abdomen is soft.  Musculoskeletal:        General: Normal range of motion.     Cervical back: Normal range of motion.  Skin:    General: Skin is warm and dry.  Neurological:     General: No focal deficit present.     Mental Status: She is alert and oriented to person, place, and time.  Psychiatric:        Mood and Affect: Mood normal.        Behavior: Behavior normal.        Thought Content: Thought content normal.        Judgment: Judgment normal.    Assessment/plan  1. Candidal skin infection -  will start on Diflucan PO and Nystatin cream - fluconazole (DIFLUCAN) 150 MG tablet; Repeat in 72 hours  Dispense: 2 tablet; Refill: 0 - nystatin cream (MYCOSTATIN); Apply 1 Application topically 3 (three) times daily. To rashes under bilateral breasts and bilateral groin  Dispense: 30 g; Refill: 3  2. Type 2 diabetes mellitus with diabetic polyneuropathy, with long-term current use of insulin (HCC) Lab Results  Component Value Date   HGBA1C 6.8 (A) 04/03/2023   -   Continue insulin glargine and insulin lispro -    Check blood sugar, log and bring to next appointment  3. Urinary tract infection without hematuria, site unspecified - POCT Urinalysis Dip Manual showed moderate blood, positive protein, negative  nitrate and moderate leukocyte - ciprofloxacin (CIPRO) 500 MG tablet; Take 1 tablet (500 mg total) by mouth 2 (two) times daily for 5 days. Do not take Tizanidine while on Cipro.  Dispense: 10 tablet; Refill: 0     Labs/tests ordered: Urine dipstick, urine culture  Next appt:  Visit date not found

## 2023-07-14 ENCOUNTER — Encounter: Payer: Medicare Other | Admitting: Registered Nurse

## 2023-07-14 LAB — URINE CULTURE
MICRO NUMBER:: 15758013
SPECIMEN QUALITY:: ADEQUATE

## 2023-07-15 ENCOUNTER — Other Ambulatory Visit: Payer: Self-pay | Admitting: Family

## 2023-07-15 DIAGNOSIS — E1151 Type 2 diabetes mellitus with diabetic peripheral angiopathy without gangrene: Secondary | ICD-10-CM

## 2023-07-16 NOTE — Progress Notes (Signed)
-  Bacteria isolated in urine culture was sensitive to Cipro, continue Cipro

## 2023-07-17 ENCOUNTER — Other Ambulatory Visit: Payer: Self-pay | Admitting: Adult Health

## 2023-07-17 ENCOUNTER — Telehealth: Payer: Self-pay | Admitting: *Deleted

## 2023-07-17 DIAGNOSIS — B372 Candidiasis of skin and nail: Secondary | ICD-10-CM

## 2023-07-17 DIAGNOSIS — N390 Urinary tract infection, site not specified: Secondary | ICD-10-CM

## 2023-07-17 MED ORDER — CIPROFLOXACIN HCL 500 MG PO TABS
500.0000 mg | ORAL_TABLET | Freq: Two times a day (BID) | ORAL | 0 refills | Status: AC
Start: 2023-07-17 — End: 2023-07-19

## 2023-07-17 MED ORDER — FLUCONAZOLE 150 MG PO TABS: ORAL_TABLET | ORAL | 0 refills | Status: DC

## 2023-07-17 NOTE — Telephone Encounter (Signed)
Spoke to patient and gave her the results for her urine culture. Patient states that she needs refills of the Cipro and Fluconazole. I let provider know.

## 2023-07-17 NOTE — Telephone Encounter (Signed)
All patient medications have warnings. Medications pend and sent to PCP Ngetich, Donalee Citrin, NP for approval.

## 2023-07-19 ENCOUNTER — Encounter: Payer: Medicare Other | Attending: Physical Medicine and Rehabilitation | Admitting: Registered Nurse

## 2023-07-19 ENCOUNTER — Encounter: Payer: Self-pay | Admitting: Registered Nurse

## 2023-07-19 VITALS — BP 104/61 | HR 77 | Ht 66.0 in | Wt 204.0 lb

## 2023-07-19 DIAGNOSIS — G894 Chronic pain syndrome: Secondary | ICD-10-CM | POA: Diagnosis not present

## 2023-07-19 DIAGNOSIS — M25511 Pain in right shoulder: Secondary | ICD-10-CM | POA: Insufficient documentation

## 2023-07-19 DIAGNOSIS — M2550 Pain in unspecified joint: Secondary | ICD-10-CM | POA: Diagnosis not present

## 2023-07-19 DIAGNOSIS — M170 Bilateral primary osteoarthritis of knee: Secondary | ICD-10-CM | POA: Diagnosis not present

## 2023-07-19 DIAGNOSIS — M542 Cervicalgia: Secondary | ICD-10-CM | POA: Diagnosis not present

## 2023-07-19 DIAGNOSIS — G6289 Other specified polyneuropathies: Secondary | ICD-10-CM | POA: Diagnosis not present

## 2023-07-19 DIAGNOSIS — Z5181 Encounter for therapeutic drug level monitoring: Secondary | ICD-10-CM | POA: Diagnosis not present

## 2023-07-19 DIAGNOSIS — M961 Postlaminectomy syndrome, not elsewhere classified: Secondary | ICD-10-CM | POA: Diagnosis not present

## 2023-07-19 DIAGNOSIS — M25512 Pain in left shoulder: Secondary | ICD-10-CM | POA: Diagnosis not present

## 2023-07-19 DIAGNOSIS — M5412 Radiculopathy, cervical region: Secondary | ICD-10-CM | POA: Insufficient documentation

## 2023-07-19 DIAGNOSIS — Z79891 Long term (current) use of opiate analgesic: Secondary | ICD-10-CM | POA: Diagnosis not present

## 2023-07-19 DIAGNOSIS — G8929 Other chronic pain: Secondary | ICD-10-CM | POA: Diagnosis not present

## 2023-07-19 DIAGNOSIS — M545 Low back pain, unspecified: Secondary | ICD-10-CM | POA: Diagnosis not present

## 2023-07-19 MED ORDER — OXYCODONE-ACETAMINOPHEN 10-325 MG PO TABS: 1.0000 | ORAL_TABLET | ORAL | 0 refills | Status: DC | PRN

## 2023-07-19 NOTE — Progress Notes (Signed)
Subjective:    Patient ID: Katherine Delgado, female    DOB: March 31, 1941, 82 y.o.   MRN: [id number]  HPI: Katherine Delgado is a 82 y.o. female who returns for follow up appointment for chronic pain and medication refill. She states her pain is located in her neck radiating into her bilateral shoulders L>R, lower back and bilateral knees. She also reports tingling and burning in her bilateral feet and generalized joint pain. She rates her pain 8. Her current exercise regime is walking short distances in her home with walker and performing stretching exercises.  Ms. Schwerdt Morphine equivalent is 90.00 MME.   Last Oral Swab was Performed on 04/21/2023, it was consistent.    Pain Inventory Average Pain 9 Pain Right Now 8 My pain is sharp, burning, dull, stabbing, tingling, and aching  In the last 24 hours, has pain interfered with the following? General activity 10 Relation with others 10 Enjoyment of life 10 What TIME of day is your pain at its worst? morning  and night Sleep (in general) Poor  Pain is worse with: walking, bending, sitting, inactivity, standing, and some activites Pain improves with: rest and medication Relief from Meds: 4  Family History  Problem Relation Age of Onset   Alzheimer's disease Mother    Heart disease Mother    Heart disease Father    Liver disease Father    Cancer Brother    Colon polyps Brother    Arthritis Son    Colon cancer Neg Hx    Esophageal cancer Neg Hx    Kidney disease Neg Hx    Stomach cancer Neg Hx    Rectal cancer Neg Hx    Social History   Socioeconomic History   Marital status: Widowed    Spouse name: Not on file   Number of children: 2   Years of education: 14   Highest education level: Not on file  Occupational History   Occupation: Retired  Tobacco Use   Smoking status: Former    Current packs/day: 0.00    Average packs/day: 0.1 packs/day for 10.0 years (1.0 ttl pk-yrs)    Types: Cigarettes    Start date: 12/06/1969     Quit date: 12/07/1979    Years since quitting: 43.6    Passive exposure: Current   Smokeless tobacco: Never  Vaping Use   Vaping status: Never Used  Substance and Sexual Activity   Alcohol use: No    Alcohol/week: 0.0 standard drinks of alcohol   Drug use: No   Sexual activity: Not Currently  Other Topics Concern   Not on file  Social History Narrative   Walks with cane   Right handed    Caffeine use: Coffee (2 cups every morning)   Tea: sometimes   Soda: none   Social Determinants of Health   Financial Resource Strain: Medium Risk (10/05/2017)   Overall Financial Resource Strain (CARDIA)    Difficulty of Paying Living Expenses: Somewhat hard  Food Insecurity: No Food Insecurity (10/25/2022)   Hunger Vital Sign    Worried About Running Out of Food in the Last Year: Never true    Ran Out of Food in the Last Year: Never true  Transportation Needs: No Transportation Needs (10/25/2022)   PRAPARE - Administrator, Civil Service (Medical): No    Lack of Transportation (Non-Medical): No  Physical Activity: Inactive (10/05/2017)   Exercise Vital Sign    Days of Exercise per Week: 0 days  Minutes of Exercise per Session: 0 min  Stress: Stress Concern Present (10/05/2017)   [employer] of Occupational Health - Occupational Stress Questionnaire    Feeling of Stress : Rather much  Social Connections: Moderately Isolated (10/05/2017)   Social Connection and Isolation Panel [NHANES]    Frequency of Communication with Friends and Family: More than three times a week    Frequency of Social Gatherings with Friends and Family: More than three times a week    Attends Religious Services: Never    Database administrator or Organizations: No    Attends Banker Meetings: Never    Marital Status: Widowed   Past Surgical History:  Procedure Laterality Date   ABDOMINAL HYSTERECTOMY  1974   abdominal tumor  2002   APPENDECTOMY     APPLICATION OF A-CELL OF BACK N/A  09/26/2018   Procedure: With Acell;  Surgeon: Peggye Form, DO;  [facility];  Service: Plastics;  Laterality: N/A;   APPLICATION OF WOUND VAC N/A 09/26/2018   Procedure: Vac placement;  Surgeon: Peggye Form, DO;  [facility];  Service: Plastics;  Laterality: N/A;   BACK SURGERY  1982   BRONCHOSCOPY  2001   CATARACT EXTRACTION, BILATERAL  09/2019, 10/2019   CHOLECYSTECTOMY  1984   INCISION AND DRAINAGE OF WOUND N/A 09/26/2018   Procedure: Debridement of spine wound;  Surgeon: Peggye Form, DO;  [facility];  Service: Plastics;  Laterality: N/A;   KNEE SURGERY Bilateral 08/27/2010 (L) and 01/11/2011 (R)   LUMBAR LAMINECTOMY/DECOMPRESSION MICRODISCECTOMY N/A 07/03/2018   Procedure: Lumbar Three to Lumbar Five Laminectomy;  Surgeon: Jadene Pierini, MD;  [facility];  Service: Neurosurgery;  Laterality: N/A;   LUMBAR WOUND DEBRIDEMENT N/A 08/20/2018   Procedure: POSTERIOR LUMBAR SPINAL WOUND DEBRIDEMENT AND REVISION;  Surgeon: Jadene Pierini, MD;  [facility];  Service: Neurosurgery;  Laterality: N/A;  POSTERIOR LUMBAR SPINAL WOUND REVISION   miscarrage  1962   OVARIAN CYST SURGERY  1968   thymus tumor     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   TONSILLECTOMY     Past Surgical History:  Procedure Laterality Date   ABDOMINAL HYSTERECTOMY  1974   abdominal tumor  2002   APPENDECTOMY     APPLICATION OF A-CELL OF BACK N/A 09/26/2018   Procedure: With Acell;  Surgeon: Peggye Form, DO;  [facility];  Service: Plastics;  Laterality: N/A;   APPLICATION OF WOUND VAC N/A 09/26/2018   Procedure: Vac placement;  Surgeon: Peggye Form, DO;  [facility];  Service: Plastics;  Laterality: N/A;   BACK SURGERY  1982   BRONCHOSCOPY  2001   CATARACT EXTRACTION, BILATERAL  09/2019, 10/2019   CHOLECYSTECTOMY  1984   INCISION AND DRAINAGE OF WOUND N/A 09/26/2018   Procedure: Debridement of spine wound;  Surgeon: Peggye Form, DO;  [facility];   Service: Plastics;  Laterality: N/A;   KNEE SURGERY Bilateral 08/27/2010 (L) and 01/11/2011 (R)   LUMBAR LAMINECTOMY/DECOMPRESSION MICRODISCECTOMY N/A 07/03/2018   Procedure: Lumbar Three to Lumbar Five Laminectomy;  Surgeon: Jadene Pierini, MD;  [facility];  Service: Neurosurgery;  Laterality: N/A;   LUMBAR WOUND DEBRIDEMENT N/A 08/20/2018   Procedure: POSTERIOR LUMBAR SPINAL WOUND DEBRIDEMENT AND REVISION;  Surgeon: Jadene Pierini, MD;  [facility];  Service: Neurosurgery;  Laterality: N/A;  POSTERIOR LUMBAR SPINAL WOUND REVISION   miscarrage  1962   OVARIAN CYST SURGERY  1968  thymus tumor     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   TONSILLECTOMY     Past Medical History:  Diagnosis Date   Abnormality of gait 04/19/2016   Acute infective polyneuritis (HCC) 2002   Allergic rhinitis due to pollen    Chronic pain syndrome    COPD (chronic obstructive pulmonary disease) (HCC)    chronic bronchitis   Depressive disorder, not elsewhere classified    Diabetes mellitus without complication (HCC)    Diaphragmatic hernia without mention of obstruction or gangrene    Dyslipidemia    Fibromyalgia    GERD (gastroesophageal reflux disease)    with h/o esophagitis   Guillain-Barre syndrome (HCC)    History of benign thymus tumor    Insomnia, unspecified    Lumbar spinal stenosis 03/30/2018   L4-5 level, severe   Miscarriage 1962   Mixed hyperlipidemia    Morbid obesity (HCC)    Osteoporosis    Pneumonia    Polyneuropathy in diabetes(357.2)    Rheumatoid arthritis(714.0)    Spondylosis, lumbosacral    Spontaneous ecchymoses    Type II or unspecified type diabetes mellitus with peripheral circulatory disorders, uncontrolled(250.72)    Unspecified chronic bronchitis (HCC)    Unspecified essential hypertension    Unspecified hypothyroidism    Unspecified pruritic disorder    Unspecified urinary incontinence    Ht 5\' 6"  (1.676 m)   BMI 33.54 kg/m   Opioid Risk Score:   Fall Risk  Score:  `1  Depression screen [facility] 2/9     07/12/2023   10:59 AM 06/09/2023    9:28 AM 06/09/2023    9:23 AM 04/21/2023    8:26 AM 03/24/2023    8:51 AM 03/07/2023    8:36 AM 02/24/2023    9:31 AM  Depression screen PHQ 2/9  Decreased Interest 0 0 0 0 0 0 0  Down, Depressed, Hopeless 1 0 0 0 0 0 0  PHQ - 2 Score 1 0 0 0 0 0 0      Review of Systems  Musculoskeletal:  Positive for back pain, gait problem and neck pain.  All other systems reviewed and are negative.     Objective:   Physical Exam Vitals and nursing note reviewed.  Constitutional:      Appearance: Normal appearance.  Neck:     Comments: Cervical Paraspinal Tenderness: C-5-C-6 Cardiovascular:     Rate and Rhythm: Normal rate and regular rhythm.     Pulses: Normal pulses.     Heart sounds: Normal heart sounds.  Pulmonary:     Effort: Pulmonary effort is normal.     Breath sounds: Normal breath sounds.  Musculoskeletal:     Comments: Normal Muscle Bulk and Muscle Testing Reveals:  Upper Extremities: Right: Full ROM and Muscle Strength  5/5 Left Upper extremity: Decreased ROM 45 Degrees and Muscle Strength 4/5 Thoracic Paraspinal Tenderness: T-7-T-9 Lumbar Paraspinal Tenderness: L-3-L-5 Lower Extremities: Full ROM and Muscle Strength 5/5 Bilateral Lower Extremities Flexion Produces Pain into her Bilateral Patella's and Bilateral Knees Arrived in wheelchair     Skin:    General: Skin is warm and dry.  Neurological:     Mental Status: She is alert and oriented to person, place, and time.  Psychiatric:        Mood and Affect: Mood normal.        Behavior: Behavior normal.         Assessment & Plan:  1.Failed Back Syndrome: Continue HEP as Tolerated. Continue to Monitor.  07/19/2023 2.Lumbar Radiculitis/ Chronic Low Back Pain without Sciatica: Continue Gabapentin. Continue to monitor. Continue HEP as Tolerated 07/19/2023 3.Bilateral  Greater Trochanter Bursitis: .Continue to Alternate Ice and Heat Therapy.  Continue current medication regimen. Continue to monitor. 07/19/2023 4.Bilateral Primary OA:/ Both Knees  Continue HEP as Tolerated. Continue to Monitor. 07/19/2023 5.Insomnia: Continue Amitriptyline . Continue to Monitor.07/19/2023 6.Chronic Pain Syndrome: Refilled: Oxycodone 10/325 mg one tablet every 4 hours as needed for pain #180. Second script sent for the following monh to accommodate schedule appointment. We will continue the opioid monitoring program, this consists of regular clinic visits, examinations, urine drug screen, pill counts as well as use of [place] Controlled Substance Reporting system. A 12 month History has been reviewed on the [place] Controlled Substance Reporting System on 07/19/2023 7. Cervicalgia/ Cervical Radiculitis: Continue Gabapentin. Continue HEP as tolerated. Continue to monitor. 07/19/2023 8. Polyarthralgia: Continue HEP as tolerated. Continue to monitor. 07/19/2023 9. Polyneuropathy: Continue Gabapentin. Continue to Monitor. 07/19/2023   F/U in 2 month

## 2023-07-24 ENCOUNTER — Other Ambulatory Visit: Payer: Self-pay | Admitting: Family

## 2023-07-24 DIAGNOSIS — R110 Nausea: Secondary | ICD-10-CM

## 2023-07-24 MED ORDER — MAGNESIUM OXIDE 400 MG PO TABS: 400.0000 mg | ORAL_TABLET | Freq: Every day | ORAL | 1 refills | Status: DC

## 2023-07-24 MED ORDER — ONDANSETRON HCL 4 MG PO TABS: 4.0000 mg | ORAL_TABLET | Freq: Three times a day (TID) | ORAL | 0 refills | Status: AC | PRN

## 2023-07-24 NOTE — Telephone Encounter (Signed)
Rx's pended and sent to Provider

## 2023-07-24 NOTE — Telephone Encounter (Signed)
Patient called to say she was here on 07/12/23 to see Monina and asked about getting her nausea medication and her magnesium refilled, but the pharmacy still does not have this order for either. She wants them sent to the [facility] on [facility].

## 2023-07-31 ENCOUNTER — Telehealth: Payer: Self-pay | Admitting: Pharmacist

## 2023-07-31 NOTE — Telephone Encounter (Signed)
Submitted a Prior Authorization RENEWAL request to [facility] for [facility] via CoverMyMeds. Will update once we receive a response.  Key: [id number]

## 2023-07-31 NOTE — Telephone Encounter (Signed)
Received notification from [facility] regarding a prior authorization for [facility]. Authorization has been APPROVED from 07/31/2023 to 08/21/2024. Approval letter sent to scan center.  Authorization # [id number] Phone # [phone]  [name], PharmD, MPH, BCPS, CPP Clinical Pharmacist (Rheumatology and Pulmonology)

## 2023-08-09 NOTE — Progress Notes (Unsigned)
Subjective:    Patient ID: Katherine Delgado, female    DOB: 11-Aug-1941, 82 y.o.   MRN: [id number]  HPI: Katherine Delgado is a 82 y.o. female who returns for follow up appointment for chronic pain and medication refill. states *** pain is located in  ***. rates pain ***. current exercise regime is walking and performing stretching exercises.  [name] Morphine equivalent is *** MME.   Last Oral Swab was Performed on 04/20/3033, it was consistent.      Pain Inventory Average Pain 9 Pain Right Now 9  My pain is constant, sharp, burning, dull, stabbing, tingling, and aching  In the last 24 hours, has pain interfered with the following? General activity 10 Relation with others 10 Enjoyment of life 10 What TIME of day is your pain at its worst? morning  and night Sleep (in general) Poor  Pain is worse with: walking, bending, sitting, inactivity, standing, and unsure Pain improves with: rest and medication Relief from Meds: 6  Family History  Problem Relation Age of Onset   Alzheimer's disease Mother    Heart disease Mother    Heart disease Father    Liver disease Father    Cancer Brother    Colon polyps Brother    Arthritis Son    Colon cancer Neg Hx    Esophageal cancer Neg Hx    Kidney disease Neg Hx    Stomach cancer Neg Hx    Rectal cancer Neg Hx    Social History   Socioeconomic History   Marital status: Widowed    Spouse name: Not on file   Number of children: 2   Years of education: 14   Highest education level: Not on file  Occupational History   Occupation: Retired  Tobacco Use   Smoking status: Former    Current packs/day: 0.00    Average packs/day: 0.1 packs/day for 10.0 years (1.0 ttl pk-yrs)    Types: Cigarettes    Start date: 12/06/1969    Quit date: 12/07/1979    Years since quitting: 43.7    Passive exposure: Current   Smokeless tobacco: Never  Vaping Use   Vaping status: Never Used  Substance and Sexual Activity   Alcohol use: No     Alcohol/week: 0.0 standard drinks of alcohol   Drug use: No   Sexual activity: Not Currently  Other Topics Concern   Not on file  Social History Narrative   Walks with cane   Right handed    Caffeine use: Coffee (2 cups every morning)   Tea: sometimes   Soda: none   Social Drivers of Corporate investment banker Strain: Medium Risk (10/05/2017)   Overall Financial Resource Strain (CARDIA)    Difficulty of Paying Living Expenses: Somewhat hard  Food Insecurity: No Food Insecurity (10/25/2022)   Hunger Vital Sign    Worried About Running Out of Food in the Last Year: Never true    Ran Out of Food in the Last Year: Never true  Transportation Needs: No Transportation Needs (10/25/2022)   PRAPARE - Administrator, Civil Service (Medical): No    Lack of Transportation (Non-Medical): No  Physical Activity: Inactive (10/05/2017)   Exercise Vital Sign    Days of Exercise per Week: 0 days    Minutes of Exercise per Session: 0 min  Stress: Stress Concern Present (10/05/2017)   [employer] of Occupational Health - Occupational Stress Questionnaire    Feeling of Stress :  Rather much  Social Connections: Moderately Isolated (10/05/2017)   Social Connection and Isolation Panel [NHANES]    Frequency of Communication with Friends and Family: More than three times a week    Frequency of Social Gatherings with Friends and Family: More than three times a week    Attends Religious Services: Never    Database administrator or Organizations: No    Attends Banker Meetings: Never    Marital Status: Widowed   Past Surgical History:  Procedure Laterality Date   ABDOMINAL HYSTERECTOMY  1974   abdominal tumor  2002   APPENDECTOMY     APPLICATION OF A-CELL OF BACK N/A 09/26/2018   Procedure: With Acell;  Surgeon: Peggye Form, DO;  [facility];  Service: Plastics;  Laterality: N/A;   APPLICATION OF WOUND VAC N/A 09/26/2018   Procedure: Vac placement;  Surgeon:  Peggye Form, DO;  [facility]: [facility];  Service: Plastics;  Laterality: N/A;   BACK SURGERY  1982   BRONCHOSCOPY  2001   CATARACT EXTRACTION, BILATERAL  09/2019, 10/2019   CHOLECYSTECTOMY  1984   INCISION AND DRAINAGE OF WOUND N/A 09/26/2018   Procedure: Debridement of spine wound;  Surgeon: Peggye Form, DO;  [facility];  Service: Plastics;  Laterality: N/A;   KNEE SURGERY Bilateral 08/27/2010 (L) and 01/11/2011 (R)   LUMBAR LAMINECTOMY/DECOMPRESSION MICRODISCECTOMY N/A 07/03/2018   Procedure: Lumbar Three to Lumbar Five Laminectomy;  Surgeon: Jadene Pierini, MD;  [facility];  Service: Neurosurgery;  Laterality: N/A;   LUMBAR WOUND DEBRIDEMENT N/A 08/20/2018   Procedure: POSTERIOR LUMBAR SPINAL WOUND DEBRIDEMENT AND REVISION;  Surgeon: Jadene Pierini, MD;  [facility];  Service: Neurosurgery;  Laterality: N/A;  POSTERIOR LUMBAR SPINAL WOUND REVISION   miscarrage  1962   OVARIAN CYST SURGERY  1968   thymus tumor     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   TONSILLECTOMY     Past Surgical History:  Procedure Laterality Date   ABDOMINAL HYSTERECTOMY  1974   abdominal tumor  2002   APPENDECTOMY     APPLICATION OF A-CELL OF BACK N/A 09/26/2018   Procedure: With Acell;  Surgeon: Peggye Form, DO;  [facility];  Service: Plastics;  Laterality: N/A;   APPLICATION OF WOUND VAC N/A 09/26/2018   Procedure: Vac placement;  Surgeon: Peggye Form, DO;  [facility];  Service: Plastics;  Laterality: N/A;   BACK SURGERY  1982   BRONCHOSCOPY  2001   CATARACT EXTRACTION, BILATERAL  09/2019, 10/2019   CHOLECYSTECTOMY  1984   INCISION AND DRAINAGE OF WOUND N/A 09/26/2018   Procedure: Debridement of spine wound;  Surgeon: Peggye Form, DO;  [facility];  Service: Plastics;  Laterality: N/A;   KNEE SURGERY Bilateral 08/27/2010 (L) and 01/11/2011 (R)   LUMBAR LAMINECTOMY/DECOMPRESSION MICRODISCECTOMY N/A 07/03/2018   Procedure: Lumbar Three to Lumbar Five  Laminectomy;  Surgeon: Jadene Pierini, MD;  [facility];  Service: Neurosurgery;  Laterality: N/A;   LUMBAR WOUND DEBRIDEMENT N/A 08/20/2018   Procedure: POSTERIOR LUMBAR SPINAL WOUND DEBRIDEMENT AND REVISION;  Surgeon: Jadene Pierini, MD;  [facility];  Service: Neurosurgery;  Laterality: N/A;  POSTERIOR LUMBAR SPINAL WOUND REVISION   miscarrage  1962   OVARIAN CYST SURGERY  1968   thymus tumor     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   TONSILLECTOMY     Past Medical History:  Diagnosis Date   Abnormality of gait 04/19/2016  Acute infective polyneuritis (HCC) 2002   Allergic rhinitis due to pollen    Chronic pain syndrome    COPD (chronic obstructive pulmonary disease) (HCC)    chronic bronchitis   Depressive disorder, not elsewhere classified    Diabetes mellitus without complication (HCC)    Diaphragmatic hernia without mention of obstruction or gangrene    Dyslipidemia    Fibromyalgia    GERD (gastroesophageal reflux disease)    with h/o esophagitis   Guillain-Barre syndrome (HCC)    History of benign thymus tumor    Insomnia, unspecified    Lumbar spinal stenosis 03/30/2018   L4-5 level, severe   Miscarriage 1962   Mixed hyperlipidemia    Morbid obesity (HCC)    Osteoporosis    Pneumonia    Polyneuropathy in diabetes(357.2)    Rheumatoid arthritis(714.0)    Spondylosis, lumbosacral    Spontaneous ecchymoses    Type II or unspecified type diabetes mellitus with peripheral circulatory disorders, uncontrolled(250.72)    Unspecified chronic bronchitis (HCC)    Unspecified essential hypertension    Unspecified hypothyroidism    Unspecified pruritic disorder    Unspecified urinary incontinence    There were no vitals taken for this visit.  Opioid Risk Score:   Fall Risk Score:  `1  Depression screen [facility] 2/9     07/12/2023   10:59 AM 06/09/2023    9:28 AM 06/09/2023    9:23 AM 04/21/2023    8:26 AM 03/24/2023    8:51 AM 03/07/2023    8:36 AM 02/24/2023    9:31 AM   Depression screen PHQ 2/9  Decreased Interest 0 0 0 0 0 0 0  Down, Depressed, Hopeless 1 0 0 0 0 0 0  PHQ - 2 Score 1 0 0 0 0 0 0    Review of Systems  Musculoskeletal:  Positive for back pain and gait problem.       PAIN IN BOTH LEGS, HIPS, ARMS SHOULDERS. ALL OVER THE BODY  Neurological:  Positive for headaches.  All other systems reviewed and are negative.      Objective:   Physical Exam        Assessment & Plan:  1.Failed Back Syndrome: Continue HEP as Tolerated. Continue to Monitor. 07/19/2023 2.Lumbar Radiculitis/ Chronic Low Back Pain without Sciatica: Continue Gabapentin. Continue to monitor. Continue HEP as Tolerated 07/19/2023 3.Bilateral  Greater Trochanter Bursitis: .Continue to Alternate Ice and Heat Therapy. Continue current medication regimen. Continue to monitor. 07/19/2023 4.Bilateral Primary OA:/ Both Knees  Continue HEP as Tolerated. Continue to Monitor. 07/19/2023 5.Insomnia: Continue Amitriptyline . Continue to Monitor.07/19/2023 6.Chronic Pain Syndrome: Refilled: Oxycodone 10/325 mg one tablet every 4 hours as needed for pain #180. Second script sent for the following monh to accommodate schedule appointment. We will continue the opioid monitoring program, this consists of regular clinic visits, examinations, urine drug screen, pill counts as well as use of [place] Controlled Substance Reporting system. A 12 month History has been reviewed on the [place] Controlled Substance Reporting System on 07/19/2023 7. Cervicalgia/ Cervical Radiculitis: Continue Gabapentin. Continue HEP as tolerated. Continue to monitor. 07/19/2023 8. Polyarthralgia: Continue HEP as tolerated. Continue to monitor. 07/19/2023 9. Polyneuropathy: Continue Gabapentin. Continue to Monitor. 07/19/2023   F/U in 1 month

## 2023-08-11 ENCOUNTER — Encounter: Payer: Self-pay | Admitting: Registered Nurse

## 2023-08-11 ENCOUNTER — Encounter: Payer: Medicare Other | Attending: Physical Medicine and Rehabilitation | Admitting: Registered Nurse

## 2023-08-11 VITALS — BP 125/72 | HR 78 | Ht 66.0 in | Wt 199.0 lb

## 2023-08-11 DIAGNOSIS — G894 Chronic pain syndrome: Secondary | ICD-10-CM | POA: Insufficient documentation

## 2023-08-11 DIAGNOSIS — M7061 Trochanteric bursitis, right hip: Secondary | ICD-10-CM | POA: Diagnosis not present

## 2023-08-11 DIAGNOSIS — M25511 Pain in right shoulder: Secondary | ICD-10-CM | POA: Diagnosis not present

## 2023-08-11 DIAGNOSIS — M2550 Pain in unspecified joint: Secondary | ICD-10-CM | POA: Insufficient documentation

## 2023-08-11 DIAGNOSIS — M5412 Radiculopathy, cervical region: Secondary | ICD-10-CM | POA: Diagnosis not present

## 2023-08-11 DIAGNOSIS — G8929 Other chronic pain: Secondary | ICD-10-CM | POA: Insufficient documentation

## 2023-08-11 DIAGNOSIS — Z5181 Encounter for therapeutic drug level monitoring: Secondary | ICD-10-CM | POA: Diagnosis not present

## 2023-08-11 DIAGNOSIS — M542 Cervicalgia: Secondary | ICD-10-CM

## 2023-08-11 DIAGNOSIS — M545 Low back pain, unspecified: Secondary | ICD-10-CM

## 2023-08-11 DIAGNOSIS — M170 Bilateral primary osteoarthritis of knee: Secondary | ICD-10-CM

## 2023-08-11 DIAGNOSIS — L97529 Non-pressure chronic ulcer of other part of left foot with unspecified severity: Secondary | ICD-10-CM | POA: Insufficient documentation

## 2023-08-11 DIAGNOSIS — M961 Postlaminectomy syndrome, not elsewhere classified: Secondary | ICD-10-CM

## 2023-08-11 DIAGNOSIS — Z79891 Long term (current) use of opiate analgesic: Secondary | ICD-10-CM

## 2023-08-11 DIAGNOSIS — M7062 Trochanteric bursitis, left hip: Secondary | ICD-10-CM | POA: Diagnosis not present

## 2023-08-11 DIAGNOSIS — G6289 Other specified polyneuropathies: Secondary | ICD-10-CM

## 2023-08-11 DIAGNOSIS — M25512 Pain in left shoulder: Secondary | ICD-10-CM

## 2023-08-11 MED ORDER — OXYCODONE-ACETAMINOPHEN 10-325 MG PO TABS: 1.0000 | ORAL_TABLET | ORAL | 0 refills | Status: DC | PRN

## 2023-08-15 ENCOUNTER — Other Ambulatory Visit: Payer: Self-pay | Admitting: Family

## 2023-09-07 ENCOUNTER — Encounter: Payer: Self-pay | Admitting: Podiatry

## 2023-09-07 ENCOUNTER — Ambulatory Visit: Payer: Medicare Other | Admitting: Podiatry

## 2023-09-07 VITALS — Ht 66.0 in | Wt 199.0 lb

## 2023-09-07 DIAGNOSIS — E11621 Type 2 diabetes mellitus with foot ulcer: Secondary | ICD-10-CM

## 2023-09-07 DIAGNOSIS — L97521 Non-pressure chronic ulcer of other part of left foot limited to breakdown of skin: Secondary | ICD-10-CM | POA: Diagnosis not present

## 2023-09-07 MED ORDER — IODOSORB 0.9 % EX GEL: 1.0000 | Freq: Every day | CUTANEOUS | 0 refills | Status: AC | PRN

## 2023-09-07 NOTE — Progress Notes (Signed)
Subjective:  Patient ID: Katherine Delgado, female    DOB: May 27, 1941,  MRN: [id number]  Chief Complaint  Patient presents with   Diabetic Ulcer    Pt is here due to diabetic ulcer on the bottom of her left great toe, states it has been there since 2017, and issue with her right great toe nail, believe she has a toe fungus     83 y.o. female presents with the above complaint.  Patient presents with left hallux superficial ulceration that has been present since 2017.  She has been doing with this on and off for quite some time.  She wanted to get it evaluated she has not tried and therefore she is a diabetic.  Pain scale is 5 out of 10 dull achy in nature   Review of Systems: Negative except as noted in the HPI. Denies N/V/F/Ch.  Past Medical History:  Diagnosis Date   Abnormality of gait 04/19/2016   Acute infective polyneuritis (HCC) 2002   Allergic rhinitis due to pollen    Chronic pain syndrome    COPD (chronic obstructive pulmonary disease) (HCC)    chronic bronchitis   Depressive disorder, not elsewhere classified    Diabetes mellitus without complication (HCC)    Diaphragmatic hernia without mention of obstruction or gangrene    Dyslipidemia    Fibromyalgia    GERD (gastroesophageal reflux disease)    with h/o esophagitis   Guillain-Barre syndrome (HCC)    History of benign thymus tumor    Insomnia, unspecified    Lumbar spinal stenosis 03/30/2018   L4-5 level, severe   Miscarriage 1962   Mixed hyperlipidemia    Morbid obesity (HCC)    Osteoporosis    Pneumonia    Polyneuropathy in diabetes(357.2)    Rheumatoid arthritis(714.0)    Spondylosis, lumbosacral    Spontaneous ecchymoses    Type II or unspecified type diabetes mellitus with peripheral circulatory disorders, uncontrolled(250.72)    Unspecified chronic bronchitis (HCC)    Unspecified essential hypertension    Unspecified hypothyroidism    Unspecified pruritic disorder    Unspecified urinary incontinence      Current Outpatient Medications:    amitriptyline (ELAVIL) 50 MG tablet, Take 1 tablet (50 mg total) by mouth at bedtime. (Patient taking differently: Take 50 mg by mouth at bedtime as needed.), Disp: 90 tablet, Rfl: 3   amLODipine (NORVASC) 10 MG tablet, TAKE 1 TABLET(10 MG) BY MOUTH DAILY, Disp: 90 tablet, Rfl: 1   aspirin EC 81 MG tablet, Take 81 mg by mouth 2 (two) times a week., Disp: , Rfl:    augmented betamethasone dipropionate (DIPROLENE-AF) 0.05 % cream, Apply topically 2 (two) times daily as needed., Disp: 30 g, Rfl: 3   cadexomer iodine (IODOSORB) 0.9 % gel, Apply 1 Application topically daily as needed for wound care., Disp: 40 g, Rfl: 0   calamine lotion, Apply 1 Application topically 3 (three) times daily. To affected areas on right fore arm, Disp: 120 mL, Rfl: 0   cholecalciferol (VITAMIN D3) 25 MCG (1000 UT) tablet, Take 2,000 Units by mouth daily., Disp: , Rfl:    Continuous Glucose Sensor (DEXCOM G7 SENSOR) MISC, 1 Device by Does not apply route as directed., Disp: 9 each, Rfl: 3   Cranberry 475 MG CAPS, Take 1 capsule (475 mg total) by mouth 2 (two) times daily., Disp: 60 capsule, Rfl: 3   docusate sodium (COLACE) 100 MG capsule, Take 100 mg by mouth daily as needed for mild constipation., Disp: ,  Rfl:    DULoxetine (CYMBALTA) 20 MG capsule, TAKE 1 CAPSULE(20 MG) BY MOUTH DAILY, Disp: 30 capsule, Rfl: 3   ezetimibe (ZETIA) 10 MG tablet, Take 1 tablet (10 mg total) by mouth daily., Disp: 90 tablet, Rfl: 3   fluconazole (DIFLUCAN) 150 MG tablet, Repeat in 72 hours, Disp: 2 tablet, Rfl: 0   fluticasone (CUTIVATE) 0.05 % cream, Apply topically 2 (two) times daily as needed., Disp: 30 g, Rfl: 3   fluticasone (FLONASE) 50 MCG/ACT nasal spray, SHAKE LIQUID AND USE 2 SPRAYS IN EACH NOSTRIL AT BEDTIME, Disp: 48 g, Rfl: 0   folic acid (FOLVITE) 1 MG tablet, Take 2 tablets (2 mg total) by mouth daily., Disp: 180 tablet, Rfl: 1   furosemide (LASIX) 20 MG tablet, TAKE 1 BY MOUTH TWICE  DAILY FOR 3 DAYS AS NEEDED FOR 3 POUND WEIGHT GAIN IN ONE DAY OR 5 POUNDS IN ONE WEEK, Disp: 60 tablet, Rfl: 3   gabapentin (NEURONTIN) 600 MG tablet, TAKE 1 TABLET(600 MG) BY MOUTH THREE TIMES DAILY, Disp: 180 tablet, Rfl: 2   insulin glargine, 1 Unit Dial, (TOUJEO SOLOSTAR) 300 UNIT/ML Solostar Pen, 40 units QAM and 40 units  QPM, Disp: 30 mL, Rfl: 3   insulin lispro (HUMALOG KWIKPEN) 200 UNIT/ML KwikPen, Max daily 30 units, Disp: 30 mL, Rfl: 3   Insulin Pen Needle (B-D UF III MINI PEN NEEDLES) 31G X 5 MM MISC, 1 Device by Other route in the morning, at noon, in the evening, and at bedtime., Disp: 400 each, Rfl: 3   ipratropium-albuterol (DUONEB) 0.5-2.5 (3) MG/3ML SOLN, Take 3 mLs by nebulization every 6 (six) hours as needed., Disp: 360 mL, Rfl: 1   ketoconazole (NIZORAL) 2 % cream, Apply 1 Application topically 2 (two) times daily. (Patient taking differently: Apply 1 Application topically.), Disp: 60 g, Rfl: 1   levothyroxine (SYNTHROID) 100 MCG tablet, Take 1 tablet (100 mcg total) by mouth daily before breakfast., Disp: 90 tablet, Rfl: 3   lidocaine (LIDODERM) 5 %, PLACE 1 PATCH ONTO THE SKIN DAILY AS DIRECTED, REMOVE AND DISCARD PATCH WITHN 12 HOURS OR AS DIRECTED BY MD, Disp: 90 patch, Rfl: 3   lisinopril (ZESTRIL) 20 MG tablet, TAKE 1 TABLET BY MOUTH EVERY DAY, Disp: 90 tablet, Rfl: 1   loperamide (IMODIUM A-D) 2 MG tablet, Take 2 mg by mouth 4 (four) times daily as needed for diarrhea or loose stools., Disp: , Rfl:    magnesium oxide (MAG-OX) 400 MG tablet, Take 1 tablet (400 mg total) by mouth daily., Disp: 90 tablet, Rfl: 1   Melatonin 10 MG TABS, Take 10 mg by mouth at bedtime as needed (sleep)., Disp: , Rfl:    Menthol, Topical Analgesic, (ICY HOT MEDICATED SPRAY EX), Apply 1 application topically as needed (shoulder pain)., Disp: , Rfl:    metoprolol tartrate (LOPRESSOR) 50 MG tablet, TAKE 1 TABLET BY MOUTH TWICE DAILY, Disp: 180 tablet, Rfl: 1   Multiple Vitamins-Minerals  (MULTIVITAMIN ADULT PO), Take 1 tablet by mouth daily., Disp: , Rfl:    MYRBETRIQ 50 MG TB24 tablet, TAKE 1 TABLET(50 MG) BY MOUTH DAILY, Disp: 90 tablet, Rfl: 1   nystatin (MYCOSTATIN/NYSTOP) powder, Apply 1 Application topically 3 (three) times daily., Disp: 30 g, Rfl: 3   nystatin cream (MYCOSTATIN), Apply 1 Application topically 3 (three) times daily. To rashes under bilateral breasts and bilateral groin, Disp: 30 g, Rfl: 3   omega-3 acid ethyl esters (LOVAZA) 1 g capsule, TAKE ONE CAPSULE BY MOUTH EVERY DAY, Disp:  30 capsule, Rfl: 0   ondansetron (ZOFRAN) 4 MG tablet, Take 1 tablet (4 mg total) by mouth every 8 (eight) hours as needed for nausea or vomiting., Disp: 20 tablet, Rfl: 0   pantoprazole (PROTONIX) 40 MG tablet, Take 1 tablet (40 mg total) by mouth daily., Disp: 30 tablet, Rfl: 3   polyvinyl alcohol (LIQUIFILM TEARS) 1.4 % ophthalmic solution, Place 1 drop into both eyes 4 (four) times daily as needed (for dry/irritated eyes). , Disp: , Rfl:    potassium chloride (MICRO-K) 10 MEQ CR capsule, TAKE 2 CAPSULES(20 MEQ) BY MOUTH TWICE DAILY WHEN AS NEEDED WHEN TAKING A DOSE OF LASIX, Disp: 60 capsule, Rfl: 0   promethazine (PHENERGAN) 12.5 MG tablet, as needed., Disp: , Rfl:    Semaglutide, 2 MG/DOSE, 8 MG/3ML SOPN, Inject 2 mg as directed once a week., Disp: 3 mL, Rfl: 3   sodium chloride (OCEAN) 0.65 % nasal spray, Place 2 sprays into the nose as needed for congestion., Disp: 30 mL, Rfl: 1   tiotropium (SPIRIVA) 18 MCG inhalation capsule, Place 18 mcg into inhaler and inhale as needed., Disp: , Rfl:    tiZANidine (ZANAFLEX) 2 MG tablet, TAKE 1 TABLET(2 MG) BY MOUTH THREE TIMES DAILY, Disp: 270 tablet, Rfl: 1   Tofacitinib Citrate (XELJANZ) 5 MG TABS, Take 1 tablet (5 mg total) by mouth daily., Disp: 60 tablet, Rfl: 0   triamterene-hydrochlorothiazide (MAXZIDE-25) 37.5-25 MG tablet, TAKE 1 TABLET BY MOUTH DAILY, Disp: 90 tablet, Rfl: 3   venlafaxine XR (EFFEXOR-XR) 75 MG 24 hr capsule,  TAKE 1 CAPSULE BY MOUTH EVERY DAY WITH BREAKFAST, Disp: 90 capsule, Rfl: 1   Vitamin D, Ergocalciferol, (DRISDOL) 1.25 MG (50000 UNIT) CAPS capsule, Take 1 capsule (50,000 Units total) by mouth every 7 (seven) days., Disp: 20 capsule, Rfl: 0   oxyCODONE-acetaminophen (PERCOCET) 10-325 MG tablet, Take 1 tablet by mouth every 4 (four) hours as needed for pain., Disp: 180 tablet, Rfl: 0  Social History   Tobacco Use  Smoking Status Former   Current packs/day: 0.00   Average packs/day: 0.1 packs/day for 10.0 years (1.0 ttl pk-yrs)   Types: Cigarettes   Start date: 12/06/1969   Quit date: 12/07/1979   Years since quitting: 43.7   Passive exposure: Current  Smokeless Tobacco Never    Allergies  Allergen Reactions   Influenza Vaccines Other (See Comments)    "h/o Guillain Barre; dr's told me years ago never to take another flu shot as it could relapse Alene Mires; has to do with when vaccine being changed to H1N1 virus"   Penicillins Hives, Itching, Swelling and Rash    Has patient had a PCN reaction causing immediate rash, facial/tongue/throat swelling, SOB or lightheadedness with hypotension:Unknown Has patient had a PCN reaction causing severe rash involving mucus membranes or skin necrosis: Unknown Has patient had a PCN reaction that required hospitalization:Unknown Has patient had a PCN reaction occurring within the last 10 years: Unknown If all of the above answers are "NO", then may proceed with Cephalosporin use.    Statins Other (See Comments)    Myopathy, transaminitis   Sulfamethoxazole-Trimethoprim Itching   Objective:  There were no vitals filed for this visit. Body mass index is 32.12 kg/m. Constitutional Well developed. Well nourished.  Vascular Dorsalis pedis pulses palpable bilaterally. Posterior tibial pulses palpable bilaterally. Capillary refill normal to all digits.  No cyanosis or clubbing noted. Pedal hair growth normal.  Neurologic Normal  speech. Oriented to person, place, and time. Epicritic sensation to  light touch grossly present bilaterally.  Dermatologic Left hallux superficial ulceration limited to the breakdown of the skin mild pain on palpation.  No deep bone probing noted.  No purulent drainage noted no signs of infection noted  Orthopedic: Normal joint ROM without pain or crepitus bilaterally. No visible deformities. No bony tenderness.   Radiographs: None Assessment:   1. Diabetic ulcer of toe of left foot associated with type 2 diabetes mellitus, limited to breakdown of skin (HCC)    Plan:  Patient was evaluated and treated and all questions answered.  Left hallux IPJ ulceration limited to the breakdown of the skin -All questions and concerns were discussed with the patient in extensive detail.  At this time given that this is very superficial nature patient 100.  Provide is ordered and Band-Aid I discussed with patient she states understanding this is likely due to the underlying hammertoe contracture/hallux malleus leading to the ulceration.  She may need surgical correction in the future if it does not resolve  No follow-ups on file.

## 2023-09-12 ENCOUNTER — Encounter: Payer: Self-pay | Admitting: Registered Nurse

## 2023-09-12 ENCOUNTER — Encounter: Payer: Medicare Other | Attending: Physical Medicine and Rehabilitation | Admitting: Registered Nurse

## 2023-09-12 VITALS — BP 137/63 | HR 71 | Ht 66.0 in | Wt 200.0 lb

## 2023-09-12 DIAGNOSIS — Z5181 Encounter for therapeutic drug level monitoring: Secondary | ICD-10-CM | POA: Diagnosis not present

## 2023-09-12 DIAGNOSIS — M25512 Pain in left shoulder: Secondary | ICD-10-CM | POA: Insufficient documentation

## 2023-09-12 DIAGNOSIS — G8929 Other chronic pain: Secondary | ICD-10-CM | POA: Insufficient documentation

## 2023-09-12 DIAGNOSIS — M7062 Trochanteric bursitis, left hip: Secondary | ICD-10-CM | POA: Diagnosis not present

## 2023-09-12 DIAGNOSIS — Z79891 Long term (current) use of opiate analgesic: Secondary | ICD-10-CM | POA: Diagnosis not present

## 2023-09-12 DIAGNOSIS — M5412 Radiculopathy, cervical region: Secondary | ICD-10-CM | POA: Diagnosis not present

## 2023-09-12 DIAGNOSIS — M170 Bilateral primary osteoarthritis of knee: Secondary | ICD-10-CM | POA: Insufficient documentation

## 2023-09-12 DIAGNOSIS — M7061 Trochanteric bursitis, right hip: Secondary | ICD-10-CM | POA: Insufficient documentation

## 2023-09-12 DIAGNOSIS — G6289 Other specified polyneuropathies: Secondary | ICD-10-CM | POA: Diagnosis not present

## 2023-09-12 DIAGNOSIS — M545 Low back pain, unspecified: Secondary | ICD-10-CM | POA: Diagnosis not present

## 2023-09-12 DIAGNOSIS — M961 Postlaminectomy syndrome, not elsewhere classified: Secondary | ICD-10-CM | POA: Insufficient documentation

## 2023-09-12 DIAGNOSIS — M25511 Pain in right shoulder: Secondary | ICD-10-CM | POA: Diagnosis not present

## 2023-09-12 DIAGNOSIS — G894 Chronic pain syndrome: Secondary | ICD-10-CM | POA: Insufficient documentation

## 2023-09-12 DIAGNOSIS — M546 Pain in thoracic spine: Secondary | ICD-10-CM | POA: Insufficient documentation

## 2023-09-12 DIAGNOSIS — M2550 Pain in unspecified joint: Secondary | ICD-10-CM | POA: Diagnosis not present

## 2023-09-12 DIAGNOSIS — M542 Cervicalgia: Secondary | ICD-10-CM | POA: Insufficient documentation

## 2023-09-12 MED ORDER — OXYCODONE-ACETAMINOPHEN 10-325 MG PO TABS: 1.0000 | ORAL_TABLET | ORAL | 0 refills | Status: DC | PRN

## 2023-09-12 NOTE — Progress Notes (Signed)
Subjective:    Patient ID: Katherine Delgado, female    DOB: September 23, 1940, 83 y.o.   MRN: [id number]  HPI: Katherine Delgado is a 83 y.o. female who returns for follow up appointment for chronic pain and medication refill. She states her pain is located in her neck radiating into her bilateral shoulders L>R, mid- lower back pain, bilateral knee pain and generalized joint pain. She rates her pain 9. Her current exercise regime is walking in her  home with walker and performing stretching exercises.  Ms. Carmany Morphine equivalent is 90.00 MME.   Ms. Jeng arrived with no Oxycodone tablets, she should have 6 tablets. She was educated on medication compliance. Also written letter was given, she verbalizes understanding.   Oral Swab was Performed today.     Pain Inventory Average Pain 7 Pain Right Now 9 My pain is constant, sharp, burning, dull, stabbing, and aching  In the last 24 hours, has pain interfered with the following? General activity 10 Relation with others 10 Enjoyment of life 10 What TIME of day is your pain at its worst? morning , daytime, evening, and night Sleep (in general) Poor  Pain is worse with: walking, bending, sitting, inactivity, standing, and some activites Pain improves with: rest, therapy/exercise, pacing activities, and medication Relief from Meds: 5  Family History  Problem Relation Age of Onset   Alzheimer's disease Mother    Heart disease Mother    Heart disease Father    Liver disease Father    Cancer Brother    Colon polyps Brother    Arthritis Son    Colon cancer Neg Hx    Esophageal cancer Neg Hx    Kidney disease Neg Hx    Stomach cancer Neg Hx    Rectal cancer Neg Hx    Social History   Socioeconomic History   Marital status: Widowed    Spouse name: Not on file   Number of children: 2   Years of education: 14   Highest education level: Not on file  Occupational History   Occupation: Retired  Tobacco Use   Smoking status: Former     Current packs/day: 0.00    Average packs/day: 0.1 packs/day for 10.0 years (1.0 ttl pk-yrs)    Types: Cigarettes    Start date: 12/06/1969    Quit date: 12/07/1979    Years since quitting: 43.7    Passive exposure: Current   Smokeless tobacco: Never  Vaping Use   Vaping status: Never Used  Substance and Sexual Activity   Alcohol use: No    Alcohol/week: 0.0 standard drinks of alcohol   Drug use: No   Sexual activity: Not Currently  Other Topics Concern   Not on file  Social History Narrative   Walks with cane   Right handed    Caffeine use: Coffee (2 cups every morning)   Tea: sometimes   Soda: none   Social Drivers of Corporate investment banker Strain: Medium Risk (10/05/2017)   Overall Financial Resource Strain (CARDIA)    Difficulty of Paying Living Expenses: Somewhat hard  Food Insecurity: No Food Insecurity (10/25/2022)   Hunger Vital Sign    Worried About Running Out of Food in the Last Year: Never true    Ran Out of Food in the Last Year: Never true  Transportation Needs: No Transportation Needs (10/25/2022)   PRAPARE - Administrator, Civil Service (Medical): No    Lack of Transportation (Non-Medical): No  Physical Activity: Inactive (10/05/2017)   Exercise Vital Sign    Days of Exercise per Week: 0 days    Minutes of Exercise per Session: 0 min  Stress: Stress Concern Present (10/05/2017)   [employer] of Occupational Health - Occupational Stress Questionnaire    Feeling of Stress : Rather much  Social Connections: Moderately Isolated (10/05/2017)   Social Connection and Isolation Panel [NHANES]    Frequency of Communication with Friends and Family: More than three times a week    Frequency of Social Gatherings with Friends and Family: More than three times a week    Attends Religious Services: Never    Database administrator or Organizations: No    Attends Banker Meetings: Never    Marital Status: Widowed   Past Surgical History:   Procedure Laterality Date   ABDOMINAL HYSTERECTOMY  1974   abdominal tumor  2002   APPENDECTOMY     APPLICATION OF A-CELL OF BACK N/A 09/26/2018   Procedure: With Acell;  Surgeon: Peggye Form, DO;  Location: [facility] OR;  Service: Plastics;  Laterality: N/A;   APPLICATION OF WOUND VAC N/A 09/26/2018   Procedure: Vac placement;  Surgeon: Peggye Form, DO;  Location: [facility];  Service: Plastics;  Laterality: N/A;   BACK SURGERY  1982   BRONCHOSCOPY  2001   CATARACT EXTRACTION, BILATERAL  09/2019, 10/2019   CHOLECYSTECTOMY  1984   INCISION AND DRAINAGE OF WOUND N/A 09/26/2018   Procedure: Debridement of spine wound;  Surgeon: Peggye Form, DO;  Location: [facility];  Service: Plastics;  Laterality: N/A;   KNEE SURGERY Bilateral 08/27/2010 (L) and 01/11/2011 (R)   LUMBAR LAMINECTOMY/DECOMPRESSION MICRODISCECTOMY N/A 07/03/2018   Procedure: Lumbar Three to Lumbar Five Laminectomy;  Surgeon: Jadene Pierini, MD;  Location: [facility];  Service: Neurosurgery;  Laterality: N/A;   LUMBAR WOUND DEBRIDEMENT N/A 08/20/2018   Procedure: POSTERIOR LUMBAR SPINAL WOUND DEBRIDEMENT AND REVISION;  Surgeon: Jadene Pierini, MD;  Location: [facility];  Service: Neurosurgery;  Laterality: N/A;  POSTERIOR LUMBAR SPINAL WOUND REVISION   miscarrage  1962   OVARIAN CYST SURGERY  1968   thymus tumor     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   TONSILLECTOMY     Past Surgical History:  Procedure Laterality Date   ABDOMINAL HYSTERECTOMY  1974   abdominal tumor  2002   APPENDECTOMY     APPLICATION OF A-CELL OF BACK N/A 09/26/2018   Procedure: With Acell;  Surgeon: Peggye Form, DO;  Location: [facility];  Service: Plastics;  Laterality: N/A;   APPLICATION OF WOUND VAC N/A 09/26/2018   Procedure: Vac placement;  Surgeon: Peggye Form, DO;  Location: [facility];  Service: Plastics;  Laterality: N/A;   BACK SURGERY  1982   BRONCHOSCOPY  2001   CATARACT EXTRACTION, BILATERAL  09/2019, 10/2019   CHOLECYSTECTOMY  1984    INCISION AND DRAINAGE OF WOUND N/A 09/26/2018   Procedure: Debridement of spine wound;  Surgeon: Peggye Form, DO;  Location: [facility];  Service: Plastics;  Laterality: N/A;   KNEE SURGERY Bilateral 08/27/2010 (L) and 01/11/2011 (R)   LUMBAR LAMINECTOMY/DECOMPRESSION MICRODISCECTOMY N/A 07/03/2018   Procedure: Lumbar Three to Lumbar Five Laminectomy;  Surgeon: Jadene Pierini, MD;  Location: [facility];  Service: Neurosurgery;  Laterality: N/A;   LUMBAR WOUND DEBRIDEMENT N/A 08/20/2018   Procedure: POSTERIOR LUMBAR SPINAL WOUND DEBRIDEMENT AND REVISION;  Surgeon: Jadene Pierini, MD;  Location: [facility];  Service: Neurosurgery;  Laterality: N/A;  POSTERIOR LUMBAR SPINAL WOUND REVISION   miscarrage  1962   OVARIAN CYST SURGERY  1968   thymus tumor     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   TONSILLECTOMY     Past Medical History:  Diagnosis Date   Abnormality of gait 04/19/2016   Acute infective polyneuritis (HCC) 2002   Allergic rhinitis due to pollen    Chronic pain syndrome    COPD (chronic obstructive pulmonary disease) (HCC)    chronic bronchitis   Depressive disorder, not elsewhere classified    Diabetes mellitus without complication (HCC)    Diaphragmatic hernia without mention of obstruction or gangrene    Dyslipidemia    Fibromyalgia    GERD (gastroesophageal reflux disease)    with h/o esophagitis   Guillain-Barre syndrome (HCC)    History of benign thymus tumor    Insomnia, unspecified    Lumbar spinal stenosis 03/30/2018   L4-5 level, severe   Miscarriage 1962   Mixed hyperlipidemia    Morbid obesity (HCC)    Osteoporosis    Pneumonia    Polyneuropathy in diabetes(357.2)    Rheumatoid arthritis(714.0)    Spondylosis, lumbosacral    Spontaneous ecchymoses    Type II or unspecified type diabetes mellitus with peripheral circulatory disorders, uncontrolled(250.72)    Unspecified chronic bronchitis (HCC)    Unspecified essential hypertension    Unspecified hypothyroidism     Unspecified pruritic disorder    Unspecified urinary incontinence    There were no vitals taken for this visit.  Opioid Risk Score:   Fall Risk Score:  `1  Depression screen [facility] 2/9     08/11/2023    9:27 AM 07/12/2023   10:59 AM 06/09/2023    9:28 AM 06/09/2023    9:23 AM 04/21/2023    8:26 AM 03/24/2023    8:51 AM 03/07/2023    8:36 AM  Depression screen PHQ 2/9  Decreased Interest 0 0 0 0 0 0 0  Down, Depressed, Hopeless 0 1 0 0 0 0 0  PHQ - 2 Score 0 1 0 0 0 0 0     Review of Systems  Musculoskeletal:  Positive for back pain, gait problem and neck pain.       B/L shoulder, arm, and leg pain   All other systems reviewed and are negative.     Objective:   Physical Exam Vitals and nursing note reviewed.  Constitutional:      Appearance: Normal appearance.  Neck:     Comments: Cervical Paraspinal Tenderness: C-5-C-6 Cardiovascular:     Rate and Rhythm: Normal rate and regular rhythm.     Pulses: Normal pulses.     Heart sounds: Normal heart sounds.  Pulmonary:     Effort: Pulmonary effort is normal.     Breath sounds: Normal breath sounds.  Musculoskeletal:     Comments: Normal Muscle Bulk and Muscle Testing Reveals:  Upper Extremities: Right: Decreased ROM 90 Degrees  and Muscle Strength 5/5 Left Upper Extremity: Decreased ROM 45 Degrees and Muscle Strength 5/5  Thoracic Paraspinal Tenderness: T-7-T-9 Lumbar Paraspinal Tenderness: L-3-L-5 Bilateral Greater Trochanter Tenderness Lower Extremities: Full ROM and Muscle Strength 5/5 Arrived in wheelchair    Skin:    General: Skin is warm and dry.  Neurological:     General: No focal deficit present.     Mental Status: She is alert and oriented to person, place, and time.  Psychiatric:        Mood and Affect: Mood  normal.        Behavior: Behavior normal.         Assessment & Plan:  1.Failed Back Syndrome: Continue HEP as Tolerated. Continue to Monitor. 09/12/2023 2.Lumbar Radiculitis/ Chronic Low Back  Pain without Sciatica: Continue Gabapentin. Continue to monitor. Continue HEP as Tolerated 09/12/2023 3.Bilateral  Greater Trochanter Bursitis: .Continue to Alternate Ice and Heat Therapy. Continue current medication regimen. Continue to monitor. 09/12/2023 4.Bilateral Primary OA:/ Both Knees  Continue HEP as Tolerated. Continue to Monitor. 09/12/2023 5.Insomnia: Continue Amitriptyline . Continue to Monitor.09/12/2023 6.Chronic Pain Syndrome: Refilled: Oxycodone 10/325 mg one tablet every 4 hours as needed for pain #180. We will continue the opioid monitoring program, this consists of regular clinic visits, examinations, urine drug screen, pill counts as well as use of [place] Controlled Substance Reporting system. A 12 month History has been reviewed on the [place] Controlled Substance Reporting System on 09/12/2023 7. Cervicalgia/ Cervical Radiculitis: Continue Gabapentin. Continue HEP as tolerated. Continue to monitor. 09/12/2023 8. Polyarthralgia: Continue HEP as tolerated. Continue to monitor. 09/12/2023 9. Polyneuropathy: Continue Gabapentin. Continue to Monitor. 09/12/2023   F/U in 1 month

## 2023-09-13 ENCOUNTER — Other Ambulatory Visit: Payer: Self-pay | Admitting: Family

## 2023-09-13 DIAGNOSIS — F331 Major depressive disorder, recurrent, moderate: Secondary | ICD-10-CM

## 2023-09-13 DIAGNOSIS — I10 Essential (primary) hypertension: Secondary | ICD-10-CM

## 2023-09-13 NOTE — Progress Notes (Deleted)
 Office Visit Note  Patient: Katherine Delgado             Date of Birth: [birth year]           MRN: [id number]             PCP: Leonarda Roxan BROCKS, NP Referring: Leonarda Roxan BROCKS, NP Visit Date: 09/27/2023 Occupation: @GUAROCC @  Subjective:  No chief complaint on file.   History of Present Illness: Katherine Delgado is a 83 y.o. female ***     Activities of Daily Living:  Patient reports morning stiffness for *** {minute/hour:19697}.   Patient {ACTIONS;DENIES/REPORTS:21021675::Denies} nocturnal pain.  Difficulty dressing/grooming: {ACTIONS;DENIES/REPORTS:21021675::Denies} Difficulty climbing stairs: {ACTIONS;DENIES/REPORTS:21021675::Denies} Difficulty getting out of chair: {ACTIONS;DENIES/REPORTS:21021675::Denies} Difficulty using hands for taps, buttons, cutlery, and/or writing: {ACTIONS;DENIES/REPORTS:21021675::Denies}  No Rheumatology ROS completed.   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   Primary osteoarthritis of right knee 07/25/2019   Wound dehiscence 08/17/2018   Lumbar spinal stenosis 03/30/2018   Rheumatoid arthritis involving both wrists with positive rheumatoid factor (HCC) 06/14/2016   High risk medication use 06/14/2016   Sjogren's syndrome (HCC) 06/14/2016   Primary osteoarthritis of both knees 06/14/2016   DDD (degenerative disc disease), lumbar 06/14/2016   Hypothyroidism 06/14/2016   Osteoporosis 06/14/2016   Abnormality of gait 04/19/2016   Type 2 diabetes mellitus with diabetic polyneuropathy, with long-term current use of insulin  (HCC) 11/27/2015   Diabetes mellitus without complication (HCC) 09/12/2013   Headache 07/31/2013   Sinus infection 07/31/2013   Chronic  right-sided low back pain with right-sided sciatica 03/14/2013   Insomnia 12/06/2012   Nausea alone 12/06/2012   Diarrhea 12/06/2012   Urinary incontinence, urge 12/06/2012   Lumbosacral root lesions, not elsewhere classified 11/27/2012   Diabetic polyneuropathy associated with type 2 diabetes mellitus (HCC) 11/27/2012   EDEMA 05/04/2010   YEAST INFECTION 05/03/2010   Hyperlipidemia 05/03/2010   Major depression, recurrent (HCC) 05/03/2010   History of peripheral neuropathy 05/03/2010   Essential hypertension 05/03/2010   ALLERGIC RHINITIS 05/03/2010   PNEUMONIA 05/03/2010   COPD (chronic obstructive pulmonary disease) (HCC) 05/03/2010   GERD 05/03/2010   Fibromyalgia 05/03/2010   DYSPNEA 05/03/2010   CHEST PAIN 05/03/2010    Past Medical History:  Diagnosis Date   Abnormality of gait 04/19/2016   Acute infective polyneuritis (HCC) 2002   Allergic rhinitis due to pollen    Chronic pain syndrome    COPD (chronic obstructive pulmonary disease) (HCC)    chronic bronchitis   Depressive disorder, not elsewhere classified    Diabetes mellitus without complication (HCC)    Diaphragmatic hernia without mention of obstruction or gangrene    Dyslipidemia    Fibromyalgia    GERD (gastroesophageal reflux disease)    with h/o esophagitis   Guillain-Barre syndrome (HCC)    History of benign thymus tumor    Insomnia, unspecified    Lumbar spinal stenosis 03/30/2018   L4-5 level, severe   Miscarriage 1962   Mixed hyperlipidemia    Morbid obesity (HCC)    Osteoporosis    Pneumonia    Polyneuropathy in diabetes(357.2)    Rheumatoid arthritis(714.0)    Spondylosis, lumbosacral    Spontaneous ecchymoses    Type II or unspecified type diabetes  mellitus with peripheral circulatory disorders, uncontrolled(250.72)    Unspecified chronic bronchitis (HCC)    Unspecified essential hypertension    Unspecified hypothyroidism    Unspecified pruritic disorder    Unspecified urinary incontinence      Family History  Problem Relation Age of Onset   Alzheimer's disease Mother    Heart disease Mother    Heart disease Father    Liver disease Father    Cancer Brother    Colon polyps Brother    Arthritis Son    Colon cancer Neg Hx    Esophageal cancer Neg Hx    Kidney disease Neg Hx    Stomach cancer Neg Hx    Rectal cancer Neg Hx    Past Surgical History:  Procedure Laterality Date   ABDOMINAL HYSTERECTOMY  1974   abdominal tumor  2002   APPENDECTOMY     APPLICATION OF A-CELL OF BACK N/A 09/26/2018   Procedure: With Acell;  Surgeon: Lowery Estefana RAMAN, DO;  Location: [facility] OR;  Service: Plastics;  Laterality: N/A;   APPLICATION OF WOUND VAC N/A 09/26/2018   Procedure: Vac placement;  Surgeon: Lowery Estefana RAMAN, DO;  Location: [facility] OR;  Service: Plastics;  Laterality: N/A;   BACK SURGERY  1982   BRONCHOSCOPY  2001   CATARACT EXTRACTION, BILATERAL  09/2019, 10/2019   CHOLECYSTECTOMY  1984   INCISION AND DRAINAGE OF WOUND N/A 09/26/2018   Procedure: Debridement of spine wound;  Surgeon: Lowery Estefana RAMAN, DO;  Location: [facility] OR;  Service: Plastics;  Laterality: N/A;   KNEE SURGERY Bilateral 08/27/2010 (L) and 01/11/2011 (R)   LUMBAR LAMINECTOMY/DECOMPRESSION MICRODISCECTOMY N/A 07/03/2018   Procedure: Lumbar Three to Lumbar Five Laminectomy;  Surgeon: Cheryle Debby LABOR, MD;  Location: [facility] OR;  Service: Neurosurgery;  Laterality: N/A;   LUMBAR WOUND DEBRIDEMENT N/A 08/20/2018   Procedure: POSTERIOR LUMBAR SPINAL WOUND DEBRIDEMENT AND REVISION;  Surgeon: Cheryle Debby LABOR, MD;  Location: [facility] OR;  Service: Neurosurgery;  Laterality: N/A;  POSTERIOR LUMBAR SPINAL WOUND REVISION   miscarrage  1962   OVARIAN CYST SURGERY  1968   thymus tumor     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   TONSILLECTOMY     Social History   Social History Narrative   Walks with cane   Right handed    Caffeine use: Coffee (2 cups every morning)   Tea: sometimes   Soda: none   Immunization History  Administered  Date(s) Administered   Influenza-Unspecified 06/10/2011   PFIZER(Purple Top)SARS-COV-2 Vaccination 11/13/2019, 11/30/2019   Pneumococcal Conjugate-13 07/07/2016   Pneumococcal Polysaccharide-23 11/21/2003, 10/05/2017   Tdap 05/28/2016     Objective: Vital Signs: There were no vitals taken for this visit.   Physical Exam   Musculoskeletal Exam: ***  CDAI Exam: CDAI Score: -- Patient Global: --; Provider Global: -- Swollen: --; Tender: -- Joint Exam 09/27/2023   No joint exam has been documented for this visit   There is currently no information documented on the homunculus. Go to the Rheumatology activity and complete the homunculus joint exam.  Investigation: No additional findings.  Imaging: No results found.  Recent Labs: Lab Results  Component Value Date   WBC 14.6 (H) 06/27/2023   HGB 14.0 06/27/2023   PLT 331 06/27/2023   NA 144 06/27/2023   K 3.9 06/27/2023   CL 104 06/27/2023   CO2 32 06/27/2023   GLUCOSE 46 (L) 06/27/2023   BUN 12 06/27/2023   CREATININE 0.76 06/27/2023   BILITOT 0.3 06/27/2023  ALKPHOS 96 01/25/2021   AST 30 06/27/2023   ALT 32 (H) 06/27/2023   PROT 6.3 06/27/2023   ALBUMIN 3.5 01/25/2021   CALCIUM  9.4 06/27/2023   GFRAA 65 02/12/2021   QFTBGOLDPLUS NEGATIVE 03/27/2023    Speciality Comments: No specialty comments available.  Procedures:  No procedures performed Allergies: Influenza vaccines, Penicillins, Statins, and Sulfamethoxazole -trimethoprim    Assessment / Plan:     Visit Diagnoses: Rheumatoid arthritis involving multiple sites with positive rheumatoid factor (HCC)  High risk medication use  Sjogren's syndrome with keratoconjunctivitis sicca (HCC)  Primary osteoarthritis of both knees  Lumbar spondylosis  Age-related osteoporosis without current pathological fracture  Fibromyalgia  Other insomnia  History of diabetes mellitus  History of hypertension  History of Guillain-Barre syndrome  History of  COPD  History of gastroesophageal reflux (GERD)  History of peripheral neuropathy  Herpes zoster without complication  History of depression  Dyslipidemia - Elevated triglycerides March 08, 2023  Orders: No orders of the defined types were placed in this encounter.  No orders of the defined types were placed in this encounter.   Face-to-face time spent with patient was *** minutes. Greater than 50% of time was spent in counseling and coordination of care.  Follow-Up Instructions: No follow-ups on file.   Maya Nash, MD  Note - This record has been created using Animal nutritionist.  Chart creation errors have been sought, but may not always  have been located. Such creation errors do not reflect on  the standard of medical care.

## 2023-09-13 NOTE — Telephone Encounter (Signed)
High Risk Warning Populated when attempting to refill, I will send to Provider for further review 

## 2023-09-18 LAB — DRUG TOX MONITOR 1 W/CONF, ORAL FLD
Amphetamines: NEGATIVE ng/mL (ref <10)
Barbiturates: NEGATIVE ng/mL (ref <10)
Benzodiazepines: NEGATIVE ng/mL (ref <0.50)
Buprenorphine: NEGATIVE ng/mL (ref <0.10)
Cocaine: NEGATIVE ng/mL (ref <5.0)
Codeine: NEGATIVE ng/mL (ref <2.5)
Dihydrocodeine: NEGATIVE ng/mL (ref <2.5)
Fentanyl: NEGATIVE ng/mL (ref <0.10)
Heroin Metabolite: NEGATIVE ng/mL (ref <1.0)
Hydrocodone: NEGATIVE ng/mL (ref <2.5)
Hydromorphone: NEGATIVE ng/mL (ref <2.5)
MARIJUANA: NEGATIVE ng/mL (ref <2.5)
MDMA: NEGATIVE ng/mL (ref <10)
Meprobamate: NEGATIVE ng/mL (ref <2.5)
Methadone: NEGATIVE ng/mL (ref <5.0)
Morphine: NEGATIVE ng/mL (ref <2.5)
Nicotine Metabolite: NEGATIVE ng/mL (ref <5.0)
Norhydrocodone: NEGATIVE ng/mL (ref <2.5)
Noroxycodone: 26.6 ng/mL — ABNORMAL HIGH (ref <2.5)
Opiates: POSITIVE ng/mL — AB (ref <2.5)
Oxycodone: 128 ng/mL — ABNORMAL HIGH (ref <2.5)
Oxymorphone: NEGATIVE ng/mL (ref <2.5)
Phencyclidine: NEGATIVE ng/mL (ref <10)
Tapentadol: NEGATIVE ng/mL (ref <5.0)
Tramadol: NEGATIVE ng/mL (ref <5.0)
Zolpidem: NEGATIVE ng/mL (ref <5.0)

## 2023-09-18 LAB — DRUG TOX ALC METAB W/CON, ORAL FLD: Alcohol Metabolite: NEGATIVE ng/mL (ref <25)

## 2023-09-25 ENCOUNTER — Other Ambulatory Visit: Payer: Self-pay | Admitting: Physician Assistant

## 2023-09-25 ENCOUNTER — Other Ambulatory Visit: Payer: Self-pay

## 2023-09-25 DIAGNOSIS — Z79899 Other long term (current) drug therapy: Secondary | ICD-10-CM

## 2023-09-25 DIAGNOSIS — M0579 Rheumatoid arthritis with rheumatoid factor of multiple sites without organ or systems involvement: Secondary | ICD-10-CM

## 2023-09-25 NOTE — Telephone Encounter (Signed)
Last Fill: 06/29/2023  Labs: 06/27/2023 ALT is borderline elevated-32. Glucose is low. Rest of CMP WNL   TB Gold: 03/27/2023 Neg    Next Visit: 09/27/2023  Last Visit: 06/27/2023  DX: Rheumatoid arthritis involving multiple sites with positive rheumatoid factor   Current Dose per office note 06/27/2023: Harriette Ohara 5 mg 1 tablet by mouth daily.   Okay to refill Harriette Ohara?

## 2023-09-27 ENCOUNTER — Ambulatory Visit: Payer: Medicare Other | Admitting: Rheumatology

## 2023-09-27 DIAGNOSIS — M47816 Spondylosis without myelopathy or radiculopathy, lumbar region: Secondary | ICD-10-CM

## 2023-09-27 DIAGNOSIS — M810 Age-related osteoporosis without current pathological fracture: Secondary | ICD-10-CM

## 2023-09-27 DIAGNOSIS — Z8669 Personal history of other diseases of the nervous system and sense organs: Secondary | ICD-10-CM

## 2023-09-27 DIAGNOSIS — M0579 Rheumatoid arthritis with rheumatoid factor of multiple sites without organ or systems involvement: Secondary | ICD-10-CM

## 2023-09-27 DIAGNOSIS — M797 Fibromyalgia: Secondary | ICD-10-CM

## 2023-09-27 DIAGNOSIS — Z8659 Personal history of other mental and behavioral disorders: Secondary | ICD-10-CM

## 2023-09-27 DIAGNOSIS — B029 Zoster without complications: Secondary | ICD-10-CM

## 2023-09-27 DIAGNOSIS — M3501 Sicca syndrome with keratoconjunctivitis: Secondary | ICD-10-CM

## 2023-09-27 DIAGNOSIS — Z8719 Personal history of other diseases of the digestive system: Secondary | ICD-10-CM

## 2023-09-27 DIAGNOSIS — M170 Bilateral primary osteoarthritis of knee: Secondary | ICD-10-CM

## 2023-09-27 DIAGNOSIS — Z8639 Personal history of other endocrine, nutritional and metabolic disease: Secondary | ICD-10-CM

## 2023-09-27 DIAGNOSIS — Z8679 Personal history of other diseases of the circulatory system: Secondary | ICD-10-CM

## 2023-09-27 DIAGNOSIS — Z8709 Personal history of other diseases of the respiratory system: Secondary | ICD-10-CM

## 2023-09-27 DIAGNOSIS — E785 Hyperlipidemia, unspecified: Secondary | ICD-10-CM

## 2023-09-27 DIAGNOSIS — G4709 Other insomnia: Secondary | ICD-10-CM

## 2023-09-27 DIAGNOSIS — Z79899 Other long term (current) drug therapy: Secondary | ICD-10-CM

## 2023-10-04 ENCOUNTER — Ambulatory Visit: Payer: Medicare Other | Admitting: Internal Medicine

## 2023-10-04 ENCOUNTER — Encounter: Payer: Self-pay | Admitting: Internal Medicine

## 2023-10-04 VITALS — BP 124/70 | HR 64 | Ht 66.0 in | Wt 214.0 lb

## 2023-10-04 DIAGNOSIS — E1142 Type 2 diabetes mellitus with diabetic polyneuropathy: Secondary | ICD-10-CM

## 2023-10-04 DIAGNOSIS — E039 Hypothyroidism, unspecified: Secondary | ICD-10-CM

## 2023-10-04 DIAGNOSIS — Z794 Long term (current) use of insulin: Secondary | ICD-10-CM

## 2023-10-04 LAB — POCT GLYCOSYLATED HEMOGLOBIN (HGB A1C): Hemoglobin A1C: 6.2 % — AB (ref 4.0–5.6)

## 2023-10-04 LAB — POCT GLUCOSE (DEVICE FOR HOME USE): POC Glucose: 103 mg/dL — AB (ref 70–99)

## 2023-10-04 MED ORDER — DEXCOM G7 SENSOR MISC: 1.0000 | 3 refills | Status: AC

## 2023-10-04 NOTE — Patient Instructions (Addendum)
-   Continue Ozempic 2 mg once weekly  - Decrease  Toujeo 36 units every morning and 36 units every evening  - Humalog correctional insulin: Use the scale below to help guide you BEFORE each meal  Blood sugar before meal Number of units to inject  80- 165 5 units  166- 200 6 units   201 - 235 7 units  236 - 270 8 units   271 - 305 9 units          HOW TO TREAT LOW BLOOD SUGARS (Blood sugar LESS THAN 70 MG/DL) Please follow the RULE OF 15 for the treatment of hypoglycemia treatment (when your (blood sugars are less than 70 mg/dL)   STEP 1: Take 15 grams of carbohydrates when your blood sugar is low, which includes:  3-4 GLUCOSE TABS  OR 3-4 OZ OF JUICE OR REGULAR SODA OR ONE TUBE OF GLUCOSE GEL    STEP 2: RECHECK blood sugar in 15 MINUTES STEP 3: If your blood sugar is still low at the 15 minute recheck --> then, go back to STEP 1 and treat AGAIN with another 15 grams of carbohydrates.

## 2023-10-04 NOTE — Progress Notes (Unsigned)
Name: Katherine Delgado  Age/ Sex: 83 y.o., female   MRN/ DOB: [id number], 20-Feb-1941     PCP: Caesar Bookman, NP   Reason for Endocrinology Evaluation: Type 2 Diabetes Mellitus/ hypothyriodism  Initial Endocrine Consultative Visit: 10/14/2020    PATIENT IDENTIFIER: Katherine Delgado is a 83 y.o. female with a past medical history of HTN, DM, RA, failed back syndrome and Hypothyroidism.. The patient has followed with Endocrinology clinic since 10/14/2020 for consultative assistance with management of her diabetes.      DIABETIC HISTORY:  Katherine Delgado was diagnosed with DM many years ago. Her hemoglobin A1c has ranged from Her A1c was ranged from 6.6% in 2019 to 10.3 % in 2022.     On her initial visit to our clinic her A1c was 10.3% .We continued MDI regimen , victoza and started [place] and was provided with a correction scale . A dexcom prescription was sent   [name] was discontinued due to severe genital irritation    Switch Victoza to Ozempic 02/2022   THYROID HISTORY: She has been on LT-4 replacement for years. Was noted to have a consistently low TSH since 05/2020. Per pt has been diagnosed with MNG in the past. No prior sx or biopsies. She was  On Levothyroxine 125 mcg daily which we reduced to 112 mcg daily   SUBJECTIVE:   During the last visit (04/03/2023): A1c 6.8 %   Today (10/04/2023): Katherine Delgado is here for a follow up on diabetes and hypothyroidism . She checks her blood sugars multiple times a day through CGM. The patient has had hypoglycemic episodes since the last clinic visit, this is noted during the day following a bolus , she is symptomatic.    Patient has been following up with physical therapy for failed back syndrome, lumbar radiculitis as well as bursitis She was seen by podiatry 09/07/2023 for a diabetic ulcer of the left great toe She continues to follow-up with rheumatology for rheumatoid arthritis   Denies nausea or vomiting  Continues with  occasional constipation that she attributes to pain meds  Has occasional palpitations  Has occasional tremors   HOME ENDOCRINE REGIMEN:  Toujeo 40 units QAM and 40 units QPM Humalog 4 units with each meal  Ozempic 2 mg weekly  CF: Humalog ( BG- 140/30)  Levothyroxine 100 mcg daily     CONTINUOUS GLUCOSE MONITORING RECORD INTERPRETATION    Dates of Recording: 1/19-09/23/2023  Sensor description:dexcom  Results statistics:   CGM use % of time 95  Average and SD 163/27.4  Time in range 65%  % Time Above 180 32  % Time above 250 3  % Time Below target <1    Glycemic patterns summary: BG's are optimal during the night and was of the day  Hypoglycemic episodes occurred sporadically   Overnight periods: Optimal   DIABETIC COMPLICATIONS: Microvascular complications:  neuropathy Denies: CKD, retinopathy Last Eye Exam: Completed 03/25/2021  Macrovascular complications:   Denies: CAD, CVA, PVD   HISTORY:  Past Medical History:  Past Medical History:  Diagnosis Date   Abnormality of gait 04/19/2016   Acute infective polyneuritis (HCC) 2002   Allergic rhinitis due to pollen    Chronic pain syndrome    COPD (chronic obstructive pulmonary disease) (HCC)    chronic bronchitis   Depressive disorder, not elsewhere classified    Diabetes mellitus without complication (HCC)    Diaphragmatic hernia without mention of obstruction or gangrene    Dyslipidemia    Fibromyalgia  GERD (gastroesophageal reflux disease)    with h/o esophagitis   Guillain-Barre syndrome (HCC)    History of benign thymus tumor    Insomnia, unspecified    Lumbar spinal stenosis 03/30/2018   L4-5 level, severe   Miscarriage 1962   Mixed hyperlipidemia    Morbid obesity (HCC)    Osteoporosis    Pneumonia    Polyneuropathy in diabetes(357.2)    Rheumatoid arthritis(714.0)    Spondylosis, lumbosacral    Spontaneous ecchymoses    Type II or unspecified type diabetes mellitus with peripheral  circulatory disorders, uncontrolled(250.72)    Unspecified chronic bronchitis (HCC)    Unspecified essential hypertension    Unspecified hypothyroidism    Unspecified pruritic disorder    Unspecified urinary incontinence    Past Surgical History:  Past Surgical History:  Procedure Laterality Date   ABDOMINAL HYSTERECTOMY  1974   abdominal tumor  2002   APPENDECTOMY     APPLICATION OF A-CELL OF BACK N/A 09/26/2018   Procedure: With Acell;  Surgeon: Peggye Form, DO;  Location: [facility] OR;  Service: Plastics;  Laterality: N/A;   APPLICATION OF WOUND VAC N/A 09/26/2018   Procedure: Vac placement;  Surgeon: Peggye Form, DO;  Location: [facility];  Service: Plastics;  Laterality: N/A;   BACK SURGERY  1982   BRONCHOSCOPY  2001   CATARACT EXTRACTION, BILATERAL  09/2019, 10/2019   CHOLECYSTECTOMY  1984   INCISION AND DRAINAGE OF WOUND N/A 09/26/2018   Procedure: Debridement of spine wound;  Surgeon: Peggye Form, DO;  Location: [facility] OR;  Service: Plastics;  Laterality: N/A;   KNEE SURGERY Bilateral 08/27/2010 (L) and 01/11/2011 (R)   LUMBAR LAMINECTOMY/DECOMPRESSION MICRODISCECTOMY N/A 07/03/2018   Procedure: Lumbar Three to Lumbar Five Laminectomy;  Surgeon: Jadene Pierini, MD;  Location: [facility];  Service: Neurosurgery;  Laterality: N/A;   LUMBAR WOUND DEBRIDEMENT N/A 08/20/2018   Procedure: POSTERIOR LUMBAR SPINAL WOUND DEBRIDEMENT AND REVISION;  Surgeon: Jadene Pierini, MD;  Location: [facility];  Service: Neurosurgery;  Laterality: N/A;  POSTERIOR LUMBAR SPINAL WOUND REVISION   miscarrage  1962   OVARIAN CYST SURGERY  1968   thymus tumor     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   TONSILLECTOMY     Social History:  reports that she quit smoking about 43 years ago. Her smoking use included cigarettes. She started smoking about 53 years ago. She has a 1 pack-year smoking history. She has been exposed to tobacco smoke. She has never used smokeless tobacco. She reports that she does not drink  alcohol and does not use drugs. Family History:  Family History  Problem Relation Age of Onset   Alzheimer's disease Mother    Heart disease Mother    Heart disease Father    Liver disease Father    Cancer Brother    Colon polyps Brother    Arthritis Son    Colon cancer Neg Hx    Esophageal cancer Neg Hx    Kidney disease Neg Hx    Stomach cancer Neg Hx    Rectal cancer Neg Hx      HOME MEDICATIONS: Allergies as of 10/04/2023       Reactions   Influenza Vaccines Other (See Comments)   "h/o Guillain Barre; dr's told me years ago never to take another flu shot as it could relapse Alene Mires; has to do with when vaccine being changed to H1N1 virus"   Penicillins Hives, Itching, Swelling, Rash   Has patient had  a PCN reaction causing immediate rash, facial/tongue/throat swelling, SOB or lightheadedness with hypotension:Unknown Has patient had a PCN reaction causing severe rash involving mucus membranes or skin necrosis: Unknown Has patient had a PCN reaction that required hospitalization:Unknown Has patient had a PCN reaction occurring within the last 10 years: Unknown If all of the above answers are "NO", then may proceed with Cephalosporin use.   Statins Other (See Comments)   Myopathy, transaminitis   Sulfamethoxazole-trimethoprim Itching        Medication List        Accurate as of October 04, 2023 10:33 AM. If you have any questions, ask your nurse or doctor.          amitriptyline 50 MG tablet Commonly known as: ELAVIL Take 1 tablet (50 mg total) by mouth at bedtime. What changed:  when to take this reasons to take this   amLODipine 10 MG tablet Commonly known as: NORVASC TAKE 1 TABLET(10 MG) BY MOUTH DAILY   aspirin EC 81 MG tablet Take 81 mg by mouth 2 (two) times a week.   augmented betamethasone dipropionate 0.05 % cream Commonly known as: DIPROLENE-AF Apply topically 2 (two) times daily as needed.   B-D UF III MINI PEN NEEDLES 31G X 5 MM  Misc Generic drug: Insulin Pen Needle 1 Device by Other route in the morning, at noon, in the evening, and at bedtime.   calamine lotion Apply 1 Application topically 3 (three) times daily. To affected areas on right fore arm   cholecalciferol 25 MCG (1000 UNIT) tablet Commonly known as: VITAMIN D3 Take 2,000 Units by mouth daily.   Cranberry 475 MG Caps Take 1 capsule (475 mg total) by mouth 2 (two) times daily.   Dexcom G7 Sensor Misc 1 Device by Does not apply route as directed.   docusate sodium 100 MG capsule Commonly known as: COLACE Take 100 mg by mouth daily as needed for mild constipation.   DULoxetine 20 MG capsule Commonly known as: CYMBALTA TAKE 1 CAPSULE(20 MG) BY MOUTH DAILY   ezetimibe 10 MG tablet Commonly known as: ZETIA Take 1 tablet (10 mg total) by mouth daily.   fluconazole 150 MG tablet Commonly known as: DIFLUCAN Repeat in 72 hours   fluticasone 0.05 % cream Commonly known as: CUTIVATE Apply topically 2 (two) times daily as needed.   fluticasone 50 MCG/ACT nasal spray Commonly known as: FLONASE SHAKE LIQUID AND USE 2 SPRAYS IN EACH NOSTRIL AT BEDTIME   folic acid 1 MG tablet Commonly known as: FOLVITE Take 2 tablets (2 mg total) by mouth daily.   furosemide 20 MG tablet Commonly known as: LASIX TAKE 1 BY MOUTH TWICE DAILY FOR 3 DAYS AS NEEDED FOR 3 POUND WEIGHT GAIN IN ONE DAY OR 5 POUNDS IN ONE WEEK   gabapentin 600 MG tablet Commonly known as: NEURONTIN TAKE 1 TABLET(600 MG) BY MOUTH THREE TIMES DAILY   HumaLOG KwikPen 200 UNIT/ML KwikPen Generic drug: insulin lispro Max daily 30 units   ICY HOT MEDICATED SPRAY EX Apply 1 application topically as needed (shoulder pain).   Iodosorb 0.9 % gel Generic drug: cadexomer iodine Apply 1 Application topically daily as needed for wound care.   ipratropium-albuterol 0.5-2.5 (3) MG/3ML Soln Commonly known as: DUONEB Take 3 mLs by nebulization every 6 (six) hours as needed.    ketoconazole 2 % cream Commonly known as: NIZORAL Apply 1 Application topically 2 (two) times daily. What changed: when to take this   levothyroxine 100 MCG tablet  Commonly known as: SYNTHROID Take 1 tablet (100 mcg total) by mouth daily before breakfast.   lidocaine 5 % Commonly known as: LIDODERM PLACE 1 PATCH ONTO THE SKIN DAILY AS DIRECTED, REMOVE AND DISCARD PATCH WITHN 12 HOURS OR AS DIRECTED BY MD   lisinopril 20 MG tablet Commonly known as: ZESTRIL TAKE 1 TABLET BY MOUTH EVERY DAY   loperamide 2 MG tablet Commonly known as: IMODIUM A-D Take 2 mg by mouth 4 (four) times daily as needed for diarrhea or loose stools.   magnesium oxide 400 MG tablet Commonly known as: MAG-OX Take 1 tablet (400 mg total) by mouth daily.   Melatonin 10 MG Tabs Take 10 mg by mouth at bedtime as needed (sleep).   metoprolol tartrate 50 MG tablet Commonly known as: LOPRESSOR TAKE 1 TABLET BY MOUTH TWICE DAILY   MULTIVITAMIN ADULT PO Take 1 tablet by mouth daily.   Myrbetriq 50 MG Tb24 tablet Generic drug: mirabegron ER TAKE 1 TABLET(50 MG) BY MOUTH DAILY   nystatin powder Commonly known as: MYCOSTATIN/NYSTOP Apply 1 Application topically 3 (three) times daily.   nystatin cream Commonly known as: MYCOSTATIN Apply 1 Application topically 3 (three) times daily. To rashes under bilateral breasts and bilateral groin   omega-3 acid ethyl esters 1 g capsule Commonly known as: LOVAZA TAKE ONE CAPSULE BY MOUTH EVERY DAY   ondansetron 4 MG tablet Commonly known as: Zofran Take 1 tablet (4 mg total) by mouth every 8 (eight) hours as needed for nausea or vomiting.   oxyCODONE-acetaminophen 10-325 MG tablet Commonly known as: Percocet Take 1 tablet by mouth every 4 (four) hours as needed for pain.   pantoprazole 40 MG tablet Commonly known as: PROTONIX Take 1 tablet (40 mg total) by mouth daily.   polyvinyl alcohol 1.4 % ophthalmic solution Commonly known as: LIQUIFILM TEARS Place  1 drop into both eyes 4 (four) times daily as needed (for dry/irritated eyes).   potassium chloride 10 MEQ CR capsule Commonly known as: MICRO-K TAKE 2 CAPSULES(20 MEQ) BY MOUTH TWICE DAILY WHEN AS NEEDED WHEN TAKING A DOSE OF LASIX   promethazine 12.5 MG tablet Commonly known as: PHENERGAN as needed.   Semaglutide (2 MG/DOSE) 8 MG/3ML Sopn Inject 2 mg as directed once a week.   sodium chloride 0.65 % nasal spray Commonly known as: OCEAN Place 2 sprays into the nose as needed for congestion.   tiotropium 18 MCG inhalation capsule Commonly known as: SPIRIVA Place 18 mcg into inhaler and inhale as needed.   tiZANidine 2 MG tablet Commonly known as: ZANAFLEX TAKE 1 TABLET(2 MG) BY MOUTH THREE TIMES DAILY   Toujeo SoloStar 300 UNIT/ML Solostar Pen Generic drug: insulin glargine (1 Unit Dial) 40 units QAM and 40 units  QPM   triamterene-hydrochlorothiazide 37.5-25 MG tablet Commonly known as: MAXZIDE-25 TAKE 1 TABLET BY MOUTH DAILY   venlafaxine XR 75 MG 24 hr capsule Commonly known as: EFFEXOR-XR TAKE 1 CAPSULE BY MOUTH EVERY DAY WITH BREAKFAST   Vitamin D (Ergocalciferol) 1.25 MG (50000 UNIT) Caps capsule Commonly known as: DRISDOL Take 1 capsule (50,000 Units total) by mouth every 7 (seven) days.   Xeljanz 5 MG Tabs Generic drug: Tofacitinib Citrate TAKE 1 TABLET BY MOUTH DAILY         OBJECTIVE:   Vital Signs: BP 124/70 (BP Location: Left Arm, Patient Position: Sitting, Cuff Size: Normal)   Pulse 64   Ht 5\' 6"  (1.676 m)   Wt 214 lb (97.1 kg)   SpO2 95%  BMI 34.54 kg/m   Wt Readings from Last 3 Encounters:  10/04/23 214 lb (97.1 kg)  09/12/23 200 lb (90.7 kg)  09/07/23 199 lb (90.3 kg)     Exam: General: Pt appears well and is in NAD  Lungs: Clear with good BS bilat   Heart: RRR   Abdomen: soft, nontender  Extremities: No pretibial edema.   Neuro: MS is good with appropriate affect, pt is alert and Ox3    DM foot exam:1/116/2025 per  podiatry   DATA REVIEWED:  Lab Results  Component Value Date   HGBA1C 6.8 (A) 04/03/2023   HGBA1C 6.7 (A) 10/03/2022   HGBA1C 6.4 (A) 03/02/2022     Latest Reference Range & Units 10/04/23 10:56  TSH 0.40 - 4.50 mIU/L 2.65    Latest Reference Range & Units 10/04/23 10:56  Microalb, Ur mg/dL 1.9  MICROALB/CREAT RATIO <30 mg/g creat 19  Creatinine, Urine 20 - 275 mg/dL 604      Latest Reference Range & Units 06/27/23 09:01  Sodium 135 - 146 mmol/L 144  Potassium 3.5 - 5.3 mmol/L 3.9  Chloride 98 - 110 mmol/L 104  CO2 20 - 32 mmol/L 32  Glucose 65 - 99 mg/dL 46 (L)  BUN 7 - 25 mg/dL 12  Creatinine 5.40 - 9.81 mg/dL 1.91  Calcium 8.6 - 47.8 mg/dL 9.4  BUN/Creatinine Ratio 6 - 22 (calc) SEE NOTE:  eGFR > OR = 60 mL/min/1.36m2 78  AG Ratio 1.0 - 2.5 (calc) 1.3  AST 10 - 35 U/L 30  ALT 6 - 29 U/L 32 (H)  Total Protein 6.1 - 8.1 g/dL 6.3  Total Bilirubin 0.2 - 1.2 mg/dL 0.3     Latest Reference Range & Units 03/08/23 10:07  Total CHOL/HDL Ratio <5.0 (calc) 3.9  Cholesterol <200 mg/dL 295  HDL Cholesterol > OR = 50 mg/dL 37 (L)  LDL Cholesterol (Calc) mg/dL (calc) 80  Non-HDL Cholesterol (Calc) <130 mg/dL (calc) 621  Triglycerides <150 mg/dL 308 (H)      ASSESSMENT / PLAN / RECOMMENDATIONS:   1) Type 2 Diabetes Mellitus, With  Neuropathic complications - Most recent A1c of 6.8 %. Goal A1c < 7.5 %.    -A1c optimal - Intolerant to SGLT-2 inhibitors due to severe genital irritations  -Patient has been noted with a BG reading of 46 Mg/DL through rheumatology labs in November, 2024 -I will decrease her basal rate as below -I will increase Humalog as below as she has been noted with postprandial hyperglycemia on CGM download -She was provided with #2 sensors and 1 receiver for Dexcom G7 and a new prescription was printed    MEDICATIONS:  -Continue Ozempic 2 mg weekly  - Decrease Toujeo 36 units BID - Increase  Humalog 5 units with each meal  - Change CF : Humalog  ( BG - 130/35 )    EDUCATION / INSTRUCTIONS: BG monitoring instructions: Patient is instructed to check her blood sugars 3 times a day, before meals . Call [facility] if: BG persistently < 70  I reviewed the Rule of 15 for the treatment of hypoglycemia in detail with the patient. Literature supplied.   2) Diabetic complications:  Eye: Does not have known diabetic retinopathy.  Neuro/ Feet: Does  have known diabetic peripheral neuropathy .  Renal: Patient does not have known baseline CKD.    3) Hypothyroidism:    -TSH remains within normal range  Medication  levothyroxine 100 mcg daily   F/U in 6 months  Signed electronically by: Lyndle Herrlich, MD  [facility] Endocrinology  [facility] [address] [place], [place] [address] Phone: [phone] FAX: [phone]   CC: Caesar Bookman, NP [address] [place] [address] Phone: [phone]  Fax: [phone]  Return to Endocrinology clinic as below: Future Appointments  Date Time Provider Department Center  10/06/2023 10:45 AM Candelaria Stagers, DPM [facility]  10/11/2023 10:00 AM Jones Bales, NP [facility]  03/08/2024  9:00 AM Ngetich, Donalee Citrin, NP [facility] None

## 2023-10-05 ENCOUNTER — Encounter: Payer: Self-pay | Admitting: Internal Medicine

## 2023-10-05 LAB — MICROALBUMIN / CREATININE URINE RATIO
Creatinine, Urine: 101 mg/dL (ref 20–275)
Microalb Creat Ratio: 19 mg/g{creat}
Microalb, Ur: 1.9 mg/dL

## 2023-10-05 LAB — TSH: TSH: 2.65 m[IU]/L (ref 0.40–4.50)

## 2023-10-05 MED ORDER — LEVOTHYROXINE SODIUM 100 MCG PO TABS: 100.0000 ug | ORAL_TABLET | Freq: Every day | ORAL | 3 refills | Status: DC

## 2023-10-06 ENCOUNTER — Ambulatory Visit: Payer: Medicare Other | Admitting: Podiatry

## 2023-10-11 ENCOUNTER — Encounter: Payer: Medicare Other | Attending: Physical Medicine and Rehabilitation | Admitting: Registered Nurse

## 2023-10-11 VITALS — BP 138/59 | HR 75 | Ht 66.0 in

## 2023-10-11 DIAGNOSIS — M961 Postlaminectomy syndrome, not elsewhere classified: Secondary | ICD-10-CM | POA: Diagnosis not present

## 2023-10-11 DIAGNOSIS — M170 Bilateral primary osteoarthritis of knee: Secondary | ICD-10-CM | POA: Insufficient documentation

## 2023-10-11 DIAGNOSIS — M542 Cervicalgia: Secondary | ICD-10-CM | POA: Diagnosis not present

## 2023-10-11 DIAGNOSIS — G6289 Other specified polyneuropathies: Secondary | ICD-10-CM | POA: Diagnosis not present

## 2023-10-11 DIAGNOSIS — M5412 Radiculopathy, cervical region: Secondary | ICD-10-CM | POA: Insufficient documentation

## 2023-10-11 DIAGNOSIS — M25512 Pain in left shoulder: Secondary | ICD-10-CM | POA: Insufficient documentation

## 2023-10-11 DIAGNOSIS — Z5181 Encounter for therapeutic drug level monitoring: Secondary | ICD-10-CM | POA: Diagnosis not present

## 2023-10-11 DIAGNOSIS — Z79891 Long term (current) use of opiate analgesic: Secondary | ICD-10-CM | POA: Diagnosis not present

## 2023-10-11 DIAGNOSIS — G8929 Other chronic pain: Secondary | ICD-10-CM | POA: Diagnosis not present

## 2023-10-11 DIAGNOSIS — M25511 Pain in right shoulder: Secondary | ICD-10-CM | POA: Diagnosis not present

## 2023-10-11 DIAGNOSIS — M545 Low back pain, unspecified: Secondary | ICD-10-CM | POA: Diagnosis not present

## 2023-10-11 DIAGNOSIS — G894 Chronic pain syndrome: Secondary | ICD-10-CM | POA: Insufficient documentation

## 2023-10-11 DIAGNOSIS — M2550 Pain in unspecified joint: Secondary | ICD-10-CM | POA: Insufficient documentation

## 2023-10-11 MED ORDER — OXYCODONE-ACETAMINOPHEN 10-325 MG PO TABS: 1.0000 | ORAL_TABLET | ORAL | 0 refills | Status: DC | PRN

## 2023-10-11 NOTE — Progress Notes (Signed)
Subjective:    Patient ID: Katherine Delgado, female    DOB: November 24, 1940, 83 y.o.   MRN: [id number]  HPI: Katherine Delgado is a 83 y.o. female who returns for follow up appointment for chronic pain and medication refill. She states her pain is located in her neck radiating into her bilateral shoulders, lower back and bilateral knee pain. She also reports generalized joint pain.  She rates her pain. Her current exercise regime is walking short distances in her home with walker and performing stretching exercises.  Katherine Delgado Morphine equivalent is 90.00 MME.   Last Oral Swab was Performed on 09/12/2023, it was consistent.     Pain Inventory Average Pain 7 Pain Right Now 9 My pain is sharp, burning, dull, stabbing, tingling, and aching  In the last 24 hours, has pain interfered with the following? General activity 10 Relation with others 10 Enjoyment of life 10 What TIME of day is your pain at its worst? morning , daytime, evening, and night Sleep (in general) Poor  Pain is worse with: walking, bending, sitting, inactivity, standing, and some activites Pain improves with: rest, therapy/exercise, pacing activities, and medication Relief from Meds: 5  Family History  Problem Relation Age of Onset   Alzheimer's disease Mother    Heart disease Mother    Heart disease Father    Liver disease Father    Cancer Brother    Colon polyps Brother    Arthritis Son    Colon cancer Neg Hx    Esophageal cancer Neg Hx    Kidney disease Neg Hx    Stomach cancer Neg Hx    Rectal cancer Neg Hx    Social History   Socioeconomic History   Marital status: Widowed    Spouse name: Not on file   Number of children: 2   Years of education: 14   Highest education level: Not on file  Occupational History   Occupation: Retired  Tobacco Use   Smoking status: Former    Current packs/day: 0.00    Average packs/day: 0.1 packs/day for 10.0 years (1.0 ttl pk-yrs)    Types: Cigarettes    Start date:  12/06/1969    Quit date: 12/07/1979    Years since quitting: 43.8    Passive exposure: Current   Smokeless tobacco: Never  Vaping Use   Vaping status: Never Used  Substance and Sexual Activity   Alcohol use: No    Alcohol/week: 0.0 standard drinks of alcohol   Drug use: No   Sexual activity: Not Currently  Other Topics Concern   Not on file  Social History Narrative   Walks with cane   Right handed    Caffeine use: Coffee (2 cups every morning)   Tea: sometimes   Soda: none   Social Drivers of Corporate investment banker Strain: Medium Risk (10/05/2017)   Overall Financial Resource Strain (CARDIA)    Difficulty of Paying Living Expenses: Somewhat hard  Food Insecurity: No Food Insecurity (10/25/2022)   Hunger Vital Sign    Worried About Running Out of Food in the Last Year: Never true    Ran Out of Food in the Last Year: Never true  Transportation Needs: No Transportation Needs (10/25/2022)   PRAPARE - Administrator, Civil Service (Medical): No    Lack of Transportation (Non-Medical): No  Physical Activity: Inactive (10/05/2017)   Exercise Vital Sign    Days of Exercise per Week: 0 days  Minutes of Exercise per Session: 0 min  Stress: Stress Concern Present (10/05/2017)   [employer] of Occupational Health - Occupational Stress Questionnaire    Feeling of Stress : Rather much  Social Connections: Moderately Isolated (10/05/2017)   Social Connection and Isolation Panel [NHANES]    Frequency of Communication with Friends and Family: More than three times a week    Frequency of Social Gatherings with Friends and Family: More than three times a week    Attends Religious Services: Never    Database administrator or Organizations: No    Attends Banker Meetings: Never    Marital Status: Widowed   Past Surgical History:  Procedure Laterality Date   ABDOMINAL HYSTERECTOMY  1974   abdominal tumor  2002   APPENDECTOMY     APPLICATION OF A-CELL OF  BACK N/A 09/26/2018   Procedure: With Acell;  Surgeon: Peggye Form, DO;  [facility]: [facility];  Service: Plastics;  Laterality: N/A;   APPLICATION OF WOUND VAC N/A 09/26/2018   Procedure: Vac placement;  Surgeon: Peggye Form, DO;  [facility];  Service: Plastics;  Laterality: N/A;   BACK SURGERY  1982   BRONCHOSCOPY  2001   CATARACT EXTRACTION, BILATERAL  09/2019, 10/2019   CHOLECYSTECTOMY  1984   INCISION AND DRAINAGE OF WOUND N/A 09/26/2018   Procedure: Debridement of spine wound;  Surgeon: Peggye Form, DO;  [facility]: [facility];  Service: Plastics;  Laterality: N/A;   KNEE SURGERY Bilateral 08/27/2010 (L) and 01/11/2011 (R)   LUMBAR LAMINECTOMY/DECOMPRESSION MICRODISCECTOMY N/A 07/03/2018   Procedure: Lumbar Three to Lumbar Five Laminectomy;  Surgeon: Jadene Pierini, MD;  [facility];  Service: Neurosurgery;  Laterality: N/A;   LUMBAR WOUND DEBRIDEMENT N/A 08/20/2018   Procedure: POSTERIOR LUMBAR SPINAL WOUND DEBRIDEMENT AND REVISION;  Surgeon: Jadene Pierini, MD;  [facility];  Service: Neurosurgery;  Laterality: N/A;  POSTERIOR LUMBAR SPINAL WOUND REVISION   miscarrage  1962   OVARIAN CYST SURGERY  1968   thymus tumor     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   TONSILLECTOMY     Past Surgical History:  Procedure Laterality Date   ABDOMINAL HYSTERECTOMY  1974   abdominal tumor  2002   APPENDECTOMY     APPLICATION OF A-CELL OF BACK N/A 09/26/2018   Procedure: With Acell;  Surgeon: Peggye Form, DO;  [facility];  Service: Plastics;  Laterality: N/A;   APPLICATION OF WOUND VAC N/A 09/26/2018   Procedure: Vac placement;  Surgeon: Peggye Form, DO;  [facility];  Service: Plastics;  Laterality: N/A;   BACK SURGERY  1982   BRONCHOSCOPY  2001   CATARACT EXTRACTION, BILATERAL  09/2019, 10/2019   CHOLECYSTECTOMY  1984   INCISION AND DRAINAGE OF WOUND N/A 09/26/2018   Procedure: Debridement of spine wound;  Surgeon: Peggye Form, DO;  [facility];  Service: Plastics;  Laterality: N/A;   KNEE SURGERY Bilateral 08/27/2010 (L) and 01/11/2011 (R)   LUMBAR LAMINECTOMY/DECOMPRESSION MICRODISCECTOMY N/A 07/03/2018   Procedure: Lumbar Three to Lumbar Five Laminectomy;  Surgeon: Jadene Pierini, MD;  [facility];  Service: Neurosurgery;  Laterality: N/A;   LUMBAR WOUND DEBRIDEMENT N/A 08/20/2018   Procedure: POSTERIOR LUMBAR SPINAL WOUND DEBRIDEMENT AND REVISION;  Surgeon: Jadene Pierini, MD;  [facility];  Service: Neurosurgery;  Laterality: N/A;  POSTERIOR LUMBAR SPINAL WOUND REVISION   miscarrage  1962   OVARIAN CYST SURGERY  1968  thymus tumor     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   TONSILLECTOMY     Past Medical History:  Diagnosis Date   Abnormality of gait 04/19/2016   Acute infective polyneuritis (HCC) 2002   Allergic rhinitis due to pollen    Chronic pain syndrome    COPD (chronic obstructive pulmonary disease) (HCC)    chronic bronchitis   Depressive disorder, not elsewhere classified    Diabetes mellitus without complication (HCC)    Diaphragmatic hernia without mention of obstruction or gangrene    Dyslipidemia    Fibromyalgia    GERD (gastroesophageal reflux disease)    with h/o esophagitis   Guillain-Barre syndrome (HCC)    History of benign thymus tumor    Insomnia, unspecified    Lumbar spinal stenosis 03/30/2018   L4-5 level, severe   Miscarriage 1962   Mixed hyperlipidemia    Morbid obesity (HCC)    Osteoporosis    Pneumonia    Polyneuropathy in diabetes(357.2)    Rheumatoid arthritis(714.0)    Spondylosis, lumbosacral    Spontaneous ecchymoses    Type II or unspecified type diabetes mellitus with peripheral circulatory disorders, uncontrolled(250.72)    Unspecified chronic bronchitis (HCC)    Unspecified essential hypertension    Unspecified hypothyroidism    Unspecified pruritic disorder    Unspecified urinary incontinence    Pulse 75   Ht 5\' 6"  (1.676 m)   SpO2 96%   BMI 34.54 kg/m    Opioid Risk Score:   Fall Risk Score:  `1  Depression screen [facility] 2/9     09/12/2023   10:25 AM 08/11/2023    9:27 AM 07/12/2023   10:59 AM 06/09/2023    9:28 AM 06/09/2023    9:23 AM 04/21/2023    8:26 AM 03/24/2023    8:51 AM  Depression screen PHQ 2/9  Decreased Interest 0 0 0 0 0 0 0  Down, Depressed, Hopeless 0 0 1 0 0 0 0  PHQ - 2 Score 0 0 1 0 0 0 0     Review of Systems  Musculoskeletal:  Positive for back pain, gait problem and myalgias.       Objective:   Physical Exam Vitals and nursing note reviewed.  Constitutional:      Appearance: Normal appearance.  Neck:     Comments: Cervical Paraspinal Tenderness: C-5- C-6 Cardiovascular:     Rate and Rhythm: Normal rate and regular rhythm.     Pulses: Normal pulses.     Heart sounds: Normal heart sounds.  Pulmonary:     Effort: Pulmonary effort is normal.     Breath sounds: Normal breath sounds.  Musculoskeletal:     Comments: Normal Muscle Bulk and Muscle Testing Reveals:  Upper Extremities: Right: Decreased ROM 90 Degrees and Muscle Strength 5/5 Left Upper Extremity: Decreased ROM 45 Degrees and Muscle Strength 5/5 Bilateral AC Joint Tenderness  Lumbar Paraspinal Tenderness: L-4-L-5 Lower Extremities: Full ROM and Muscle Strength 5/5 Arrived in wheelchair     Skin:    General: Skin is warm and dry.  Neurological:     Mental Status: She is alert and oriented to person, place, and time.  Psychiatric:        Mood and Affect: Mood normal.        Behavior: Behavior normal.         Assessment & Plan:  1.Failed Back Syndrome: Continue HEP as Tolerated. Continue to Monitor. 10/11/2023 2.Lumbar Radiculitis/ Chronic Low Back Pain without Sciatica: Continue Gabapentin.  Continue to monitor. Continue HEP as Tolerated 10/11/2023 3.Bilateral  Greater Trochanter Bursitis: .Continue to Alternate Ice and Heat Therapy. Continue current medication regimen. Continue to monitor. 10/11/2023 4.Bilateral Primary OA:/ Both  Knees  Continue HEP as Tolerated. Continue to Monitor. 10/11/2023 5.Insomnia: Continue Amitriptyline . Continue to Monitor.10/11/2023 6.Chronic Pain Syndrome: Refilled: Oxycodone 10/325 mg one tablet every 4 hours as needed for pain #180. We will continue the opioid monitoring program, this consists of regular clinic visits, examinations, urine drug screen, pill counts as well as use of [place] Controlled Substance Reporting system. A 12 month History has been reviewed on the [place] Controlled Substance Reporting System on 10/11/2023 7. Cervicalgia/ Cervical Radiculitis: Continue Gabapentin. Continue HEP as tolerated. Continue to monitor. 10/11/2023 8. Polyarthralgia: Continue HEP as tolerated. Continue to monitor. 10/11/2023 9. Polyneuropathy: Continue Gabapentin. Continue to Monitor. 10/11/2023   F/U in 1 month

## 2023-11-06 ENCOUNTER — Other Ambulatory Visit: Payer: Self-pay | Admitting: Physician Assistant

## 2023-11-06 DIAGNOSIS — Z79899 Other long term (current) drug therapy: Secondary | ICD-10-CM

## 2023-11-06 DIAGNOSIS — M0579 Rheumatoid arthritis with rheumatoid factor of multiple sites without organ or systems involvement: Secondary | ICD-10-CM

## 2023-11-06 NOTE — Telephone Encounter (Signed)
 Last Fill: 09/25/2023  Labs: 06/27/2023  ALT is borderline elevated-32. Avoid tylenol and alcohol use. Glucose is low-46-patient fasting? Rest of CMP WNL.  Patient will have labs drawn at appt 11/09/2023   TB Gold: 03/27/2023   TB gold negative.   Next Visit: 11/09/2023  Last Visit: 06/27/2023  DX: Rheumatoid arthritis involving multiple sites with positive rheumatoid factor   Current Dose per office note 06/27/2023: Harriette Ohara 5 mg 1 tablet by mouth daily.   Okay to refill Harriette Ohara?

## 2023-11-07 ENCOUNTER — Encounter: Payer: Medicare Other | Attending: Physical Medicine and Rehabilitation | Admitting: Registered Nurse

## 2023-11-07 ENCOUNTER — Encounter: Payer: Self-pay | Admitting: Registered Nurse

## 2023-11-07 VITALS — BP 157/77 | HR 69

## 2023-11-07 DIAGNOSIS — G894 Chronic pain syndrome: Secondary | ICD-10-CM | POA: Diagnosis not present

## 2023-11-07 DIAGNOSIS — M5412 Radiculopathy, cervical region: Secondary | ICD-10-CM

## 2023-11-07 DIAGNOSIS — G8929 Other chronic pain: Secondary | ICD-10-CM | POA: Diagnosis not present

## 2023-11-07 DIAGNOSIS — M542 Cervicalgia: Secondary | ICD-10-CM | POA: Diagnosis not present

## 2023-11-07 DIAGNOSIS — M7061 Trochanteric bursitis, right hip: Secondary | ICD-10-CM

## 2023-11-07 DIAGNOSIS — E119 Type 2 diabetes mellitus without complications: Secondary | ICD-10-CM | POA: Diagnosis not present

## 2023-11-07 DIAGNOSIS — M545 Low back pain, unspecified: Secondary | ICD-10-CM | POA: Insufficient documentation

## 2023-11-07 DIAGNOSIS — Z5181 Encounter for therapeutic drug level monitoring: Secondary | ICD-10-CM | POA: Diagnosis not present

## 2023-11-07 DIAGNOSIS — M170 Bilateral primary osteoarthritis of knee: Secondary | ICD-10-CM

## 2023-11-07 DIAGNOSIS — M25512 Pain in left shoulder: Secondary | ICD-10-CM | POA: Diagnosis not present

## 2023-11-07 DIAGNOSIS — M961 Postlaminectomy syndrome, not elsewhere classified: Secondary | ICD-10-CM

## 2023-11-07 DIAGNOSIS — G6289 Other specified polyneuropathies: Secondary | ICD-10-CM

## 2023-11-07 DIAGNOSIS — M25511 Pain in right shoulder: Secondary | ICD-10-CM | POA: Insufficient documentation

## 2023-11-07 DIAGNOSIS — M2550 Pain in unspecified joint: Secondary | ICD-10-CM | POA: Diagnosis not present

## 2023-11-07 DIAGNOSIS — M7062 Trochanteric bursitis, left hip: Secondary | ICD-10-CM | POA: Insufficient documentation

## 2023-11-07 DIAGNOSIS — Z79891 Long term (current) use of opiate analgesic: Secondary | ICD-10-CM | POA: Diagnosis not present

## 2023-11-07 DIAGNOSIS — I10 Essential (primary) hypertension: Secondary | ICD-10-CM | POA: Diagnosis not present

## 2023-11-07 MED ORDER — OXYCODONE-ACETAMINOPHEN 10-325 MG PO TABS: 1.0000 | ORAL_TABLET | ORAL | 0 refills | Status: DC | PRN

## 2023-11-07 NOTE — Progress Notes (Unsigned)
 Subjective:    Patient ID: Katherine Delgado, female    DOB: 1940/09/21, 83 y.o.   MRN: [id number]  HPI: Katherine Delgado is a 83 y.o. female who returns for follow up appointment for chronic pain and medication refill. states *** pain is located in  ***. rates pain ***. current exercise regime is walking and performing stretching exercises.  Katherine Delgado Morphine equivalent is *** MME.   Last Oral Swab was Performed on 09/12/2023, it was consistent.         Pain Inventory Average Pain 7 Pain Right Now 7 My pain is sharp, burning, dull, stabbing, tingling, and aching  In the last 24 hours, has pain interfered with the following? General activity 0 Relation with others 7 Enjoyment of life 10 What TIME of day is your pain at its worst? morning  and night Sleep (in general) Poor  Pain is worse with: walking, standing, and some activites Pain improves with: rest and medication Relief from Meds: 5  Family History  Problem Relation Age of Onset   Alzheimer's disease Mother    Heart disease Mother    Heart disease Father    Liver disease Father    Cancer Brother    Colon polyps Brother    Arthritis Son    Colon cancer Neg Hx    Esophageal cancer Neg Hx    Kidney disease Neg Hx    Stomach cancer Neg Hx    Rectal cancer Neg Hx    Social History   Socioeconomic History   Marital status: Widowed    Spouse name: Not on file   Number of children: 2   Years of education: 14   Highest education level: Not on file  Occupational History   Occupation: Retired  Tobacco Use   Smoking status: Former    Current packs/day: 0.00    Average packs/day: 0.1 packs/day for 10.0 years (1.0 ttl pk-yrs)    Types: Cigarettes    Start date: 12/06/1969    Quit date: 12/07/1979    Years since quitting: 43.9    Passive exposure: Current   Smokeless tobacco: Never  Vaping Use   Vaping status: Never Used  Substance and Sexual Activity   Alcohol use: No    Alcohol/week: 0.0 standard drinks of alcohol    Drug use: No   Sexual activity: Not Currently  Other Topics Concern   Not on file  Social History Narrative   Walks with cane   Right handed    Caffeine use: Coffee (2 cups every morning)   Tea: sometimes   Soda: none   Social Drivers of Corporate investment banker Strain: Medium Risk (10/05/2017)   Overall Financial Resource Strain (CARDIA)    Difficulty of Paying Living Expenses: Somewhat hard  Food Insecurity: No Food Insecurity (10/25/2022)   Hunger Vital Sign    Worried About Running Out of Food in the Last Year: Never true    Ran Out of Food in the Last Year: Never true  Transportation Needs: No Transportation Needs (10/25/2022)   PRAPARE - Administrator, Civil Service (Medical): No    Lack of Transportation (Non-Medical): No  Physical Activity: Inactive (10/05/2017)   Exercise Vital Sign    Days of Exercise per Week: 0 days    Minutes of Exercise per Session: 0 min  Stress: Stress Concern Present (10/05/2017)   [employer] of Occupational Health - Occupational Stress Questionnaire    Feeling of Stress : Rather  much  Social Connections: Moderately Isolated (10/05/2017)   Social Connection and Isolation Panel [NHANES]    Frequency of Communication with Friends and Family: More than three times a week    Frequency of Social Gatherings with Friends and Family: More than three times a week    Attends Religious Services: Never    Database administrator or Organizations: No    Attends Banker Meetings: Never    Marital Status: Widowed   Past Surgical History:  Procedure Laterality Date   ABDOMINAL HYSTERECTOMY  1974   abdominal tumor  2002   APPENDECTOMY     APPLICATION OF A-CELL OF BACK N/A 09/26/2018   Procedure: With Acell;  Surgeon: Peggye Form, DO;  [facility];  Service: Plastics;  Laterality: N/A;   APPLICATION OF WOUND VAC N/A 09/26/2018   Procedure: Vac placement;  Surgeon: Peggye Form, DO;  [facility];   Service: Plastics;  Laterality: N/A;   BACK SURGERY  1982   BRONCHOSCOPY  2001   CATARACT EXTRACTION, BILATERAL  09/2019, 10/2019   CHOLECYSTECTOMY  1984   INCISION AND DRAINAGE OF WOUND N/A 09/26/2018   Procedure: Debridement of spine wound;  Surgeon: Peggye Form, DO;  [facility];  Service: Plastics;  Laterality: N/A;   KNEE SURGERY Bilateral 08/27/2010 (L) and 01/11/2011 (R)   LUMBAR LAMINECTOMY/DECOMPRESSION MICRODISCECTOMY N/A 07/03/2018   Procedure: Lumbar Three to Lumbar Five Laminectomy;  Surgeon: Jadene Pierini, MD;  [facility];  Service: Neurosurgery;  Laterality: N/A;   LUMBAR WOUND DEBRIDEMENT N/A 08/20/2018   Procedure: POSTERIOR LUMBAR SPINAL WOUND DEBRIDEMENT AND REVISION;  Surgeon: Jadene Pierini, MD;  [facility];  Service: Neurosurgery;  Laterality: N/A;  POSTERIOR LUMBAR SPINAL WOUND REVISION   miscarrage  1962   OVARIAN CYST SURGERY  1968   thymus tumor     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   TONSILLECTOMY     Past Surgical History:  Procedure Laterality Date   ABDOMINAL HYSTERECTOMY  1974   abdominal tumor  2002   APPENDECTOMY     APPLICATION OF A-CELL OF BACK N/A 09/26/2018   Procedure: With Acell;  Surgeon: Peggye Form, DO;  [facility];  Service: Plastics;  Laterality: N/A;   APPLICATION OF WOUND VAC N/A 09/26/2018   Procedure: Vac placement;  Surgeon: Peggye Form, DO;  [facility];  Service: Plastics;  Laterality: N/A;   BACK SURGERY  1982   BRONCHOSCOPY  2001   CATARACT EXTRACTION, BILATERAL  09/2019, 10/2019   CHOLECYSTECTOMY  1984   INCISION AND DRAINAGE OF WOUND N/A 09/26/2018   Procedure: Debridement of spine wound;  Surgeon: Peggye Form, DO;  [facility];  Service: Plastics;  Laterality: N/A;   KNEE SURGERY Bilateral 08/27/2010 (L) and 01/11/2011 (R)   LUMBAR LAMINECTOMY/DECOMPRESSION MICRODISCECTOMY N/A 07/03/2018   Procedure: Lumbar Three to Lumbar Five Laminectomy;  Surgeon: Jadene Pierini, MD;   [facility];  Service: Neurosurgery;  Laterality: N/A;   LUMBAR WOUND DEBRIDEMENT N/A 08/20/2018   Procedure: POSTERIOR LUMBAR SPINAL WOUND DEBRIDEMENT AND REVISION;  Surgeon: Jadene Pierini, MD;  [facility];  Service: Neurosurgery;  Laterality: N/A;  POSTERIOR LUMBAR SPINAL WOUND REVISION   miscarrage  1962   OVARIAN CYST SURGERY  1968   thymus tumor     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   TONSILLECTOMY     Past Medical History:  Diagnosis Date   Abnormality of gait 04/19/2016  Acute infective polyneuritis (HCC) 2002   Allergic rhinitis due to pollen    Chronic pain syndrome    COPD (chronic obstructive pulmonary disease) (HCC)    chronic bronchitis   Depressive disorder, not elsewhere classified    Diabetes mellitus without complication (HCC)    Diaphragmatic hernia without mention of obstruction or gangrene    Dyslipidemia    Fibromyalgia    GERD (gastroesophageal reflux disease)    with h/o esophagitis   Guillain-Barre syndrome (HCC)    History of benign thymus tumor    Insomnia, unspecified    Lumbar spinal stenosis 03/30/2018   L4-5 level, severe   Miscarriage 1962   Mixed hyperlipidemia    Morbid obesity (HCC)    Osteoporosis    Pneumonia    Polyneuropathy in diabetes(357.2)    Rheumatoid arthritis(714.0)    Spondylosis, lumbosacral    Spontaneous ecchymoses    Type II or unspecified type diabetes mellitus with peripheral circulatory disorders, uncontrolled(250.72)    Unspecified chronic bronchitis (HCC)    Unspecified essential hypertension    Unspecified hypothyroidism    Unspecified pruritic disorder    Unspecified urinary incontinence    BP (!) 151/79   Pulse 69   SpO2 95%   Opioid Risk Score:   Fall Risk Score:  `1  Depression screen [facility] 2/9     09/12/2023   10:25 AM 08/11/2023    9:27 AM 07/12/2023   10:59 AM 06/09/2023    9:28 AM 06/09/2023    9:23 AM 04/21/2023    8:26 AM 03/24/2023    8:51 AM  Depression screen PHQ 2/9  Decreased Interest 0  0 0 0 0 0 0  Down, Depressed, Hopeless 0 0 1 0 0 0 0  PHQ - 2 Score 0 0 1 0 0 0 0     Review of Systems  Musculoskeletal:  Positive for back pain and gait problem.       Bilateral arm pain Bilateral knee pain Bilateral leg pain  All other systems reviewed and are negative.      Objective:   Physical Exam        Assessment & Plan:  1.Failed Back Syndrome: Continue HEP as Tolerated. Continue to Monitor. 10/11/2023 2.Lumbar Radiculitis/ Chronic Low Back Pain without Sciatica: Continue Gabapentin. Continue to monitor. Continue HEP as Tolerated 10/11/2023 3.Bilateral  Greater Trochanter Bursitis: .Continue to Alternate Ice and Heat Therapy. Continue current medication regimen. Continue to monitor. 10/11/2023 4.Bilateral Primary OA:/ Both Knees  Continue HEP as Tolerated. Continue to Monitor. 10/11/2023 5.Insomnia: Continue Amitriptyline . Continue to Monitor.10/11/2023 6.Chronic Pain Syndrome: Refilled: Oxycodone 10/325 mg one tablet every 4 hours as needed for pain #180. We will continue the opioid monitoring program, this consists of regular clinic visits, examinations, urine drug screen, pill counts as well as use of [place] Controlled Substance Reporting system. A 12 month History has been reviewed on the [place] Controlled Substance Reporting System on 10/11/2023 7. Cervicalgia/ Cervical Radiculitis: Continue Gabapentin. Continue HEP as tolerated. Continue to monitor. 10/11/2023 8. Polyarthralgia: Continue HEP as tolerated. Continue to monitor. 10/11/2023 9. Polyneuropathy: Continue Gabapentin. Continue to Monitor. 10/11/2023   F/U in 1 month

## 2023-11-08 ENCOUNTER — Other Ambulatory Visit: Payer: Self-pay

## 2023-11-08 DIAGNOSIS — E1142 Type 2 diabetes mellitus with diabetic polyneuropathy: Secondary | ICD-10-CM

## 2023-11-08 MED ORDER — TOUJEO SOLOSTAR 300 UNIT/ML ~~LOC~~ SOPN: PEN_INJECTOR | SUBCUTANEOUS | 3 refills | Status: AC

## 2023-11-08 NOTE — Progress Notes (Unsigned)
 Office Visit Note  Patient: Katherine Delgado             Date of Birth: Aug 25, 1940           MRN: [id number]             PCP: Caesar Bookman, NP Referring: Caesar Bookman, NP Visit Date: 11/09/2023 Occupation: @GUAROCC @  Subjective:  No chief complaint on file.   History of Present Illness: Katherine Delgado is a 83 y.o. female ***     Activities of Daily Living:  Patient reports morning stiffness for *** {minute/hour:19697}.   Patient {ACTIONS;DENIES/REPORTS:21021675::"Denies"} nocturnal pain.  Difficulty dressing/grooming: {ACTIONS;DENIES/REPORTS:21021675::"Denies"} Difficulty climbing stairs: {ACTIONS;DENIES/REPORTS:21021675::"Denies"} Difficulty getting out of chair: {ACTIONS;DENIES/REPORTS:21021675::"Denies"} Difficulty using hands for taps, buttons, cutlery, and/or writing: {ACTIONS;DENIES/REPORTS:21021675::"Denies"}  No Rheumatology ROS completed.   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   Primary osteoarthritis of right knee 07/25/2019   Wound dehiscence 08/17/2018   Lumbar spinal stenosis 03/30/2018   Rheumatoid arthritis involving both wrists with positive rheumatoid factor (HCC) 06/14/2016   High risk medication use 06/14/2016   Sjogren's syndrome (HCC) 06/14/2016   Primary osteoarthritis of both knees 06/14/2016   DDD (degenerative disc disease), lumbar 06/14/2016   Hypothyroidism 06/14/2016   Osteoporosis 06/14/2016   Abnormality of gait 04/19/2016   Type 2 diabetes mellitus with diabetic polyneuropathy, with long-term current use of insulin (HCC) 11/27/2015   Diabetes mellitus without complication (HCC) 09/12/2013   Headache 07/31/2013   Sinus infection 07/31/2013   Chronic  right-sided low back pain with right-sided sciatica 03/14/2013   Insomnia 12/06/2012   Nausea alone 12/06/2012   Diarrhea 12/06/2012   Urinary incontinence, urge 12/06/2012   Lumbosacral root lesions, not elsewhere classified 11/27/2012   Diabetic polyneuropathy associated with type 2 diabetes mellitus (HCC) 11/27/2012   EDEMA 05/04/2010   YEAST INFECTION 05/03/2010   Hyperlipidemia 05/03/2010   Major depression, recurrent (HCC) 05/03/2010   History of peripheral neuropathy 05/03/2010   Essential hypertension 05/03/2010   ALLERGIC RHINITIS 05/03/2010   PNEUMONIA 05/03/2010   COPD (chronic obstructive pulmonary disease) (HCC) 05/03/2010   GERD 05/03/2010   Fibromyalgia 05/03/2010   DYSPNEA 05/03/2010   CHEST PAIN 05/03/2010    Past Medical History:  Diagnosis Date   Abnormality of gait 04/19/2016   Acute infective polyneuritis (HCC) 2002   Allergic rhinitis due to pollen    Chronic pain syndrome    COPD (chronic obstructive pulmonary disease) (HCC)    chronic bronchitis   Depressive disorder, not elsewhere classified    Diabetes mellitus without complication (HCC)    Diaphragmatic hernia without mention of obstruction or gangrene    Dyslipidemia    Fibromyalgia    GERD (gastroesophageal reflux disease)    with h/o esophagitis   Guillain-Barre syndrome (HCC)    History of benign thymus tumor    Insomnia, unspecified    Lumbar spinal stenosis 03/30/2018   L4-5 level, severe   Miscarriage 1962   Mixed hyperlipidemia    Morbid obesity (HCC)    Osteoporosis    Pneumonia    Polyneuropathy in diabetes(357.2)    Rheumatoid arthritis(714.0)    Spondylosis, lumbosacral    Spontaneous ecchymoses    Type II or unspecified type diabetes  mellitus with peripheral circulatory disorders, uncontrolled(250.72)    Unspecified chronic bronchitis (HCC)    Unspecified essential hypertension    Unspecified hypothyroidism    Unspecified pruritic disorder    Unspecified urinary incontinence      Family History  Problem Relation Age of Onset   Alzheimer's disease Mother    Heart disease Mother    Heart disease Father    Liver disease Father    Cancer Brother    Colon polyps Brother    Arthritis Son    Colon cancer Neg Hx    Esophageal cancer Neg Hx    Kidney disease Neg Hx    Stomach cancer Neg Hx    Rectal cancer Neg Hx    Past Surgical History:  Procedure Laterality Date   ABDOMINAL HYSTERECTOMY  1974   abdominal tumor  2002   APPENDECTOMY     APPLICATION OF A-CELL OF BACK N/A 09/26/2018   Procedure: With Acell;  Surgeon: Peggye Form, DO;  [facility];  Service: Plastics;  Laterality: N/A;   APPLICATION OF WOUND VAC N/A 09/26/2018   Procedure: Vac placement;  Surgeon: Peggye Form, DO;  [facility];  Service: Plastics;  Laterality: N/A;   BACK SURGERY  1982   BRONCHOSCOPY  2001   CATARACT EXTRACTION, BILATERAL  09/2019, 10/2019   CHOLECYSTECTOMY  1984   INCISION AND DRAINAGE OF WOUND N/A 09/26/2018   Procedure: Debridement of spine wound;  Surgeon: Peggye Form, DO;  [facility];  Service: Plastics;  Laterality: N/A;   KNEE SURGERY Bilateral 08/27/2010 (L) and 01/11/2011 (R)   LUMBAR LAMINECTOMY/DECOMPRESSION MICRODISCECTOMY N/A 07/03/2018   Procedure: Lumbar Three to Lumbar Five Laminectomy;  Surgeon: Jadene Pierini, MD;  Location[facility];  Service: Neurosurgery;  Laterality: N/A;   LUMBAR WOUND DEBRIDEMENT N/A 08/20/2018   Procedure: POSTERIOR LUMBAR SPINAL WOUND DEBRIDEMENT AND REVISION;  Surgeon: Jadene Pierini, MD;  [facility];  Service: Neurosurgery;  Laterality: N/A;  POSTERIOR LUMBAR SPINAL WOUND REVISION   miscarrage  1962   OVARIAN CYST SURGERY  1968   thymus tumor     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   TONSILLECTOMY     Social History   Social History Narrative   Walks with cane   Right handed    Caffeine use: Coffee (2 cups every morning)   Tea: sometimes   Soda: none   Immunization History  Administered  Date(s) Administered   Influenza-Unspecified 06/10/2011   PFIZER(Purple Top)SARS-COV-2 Vaccination 11/13/2019, 11/30/2019   Pneumococcal Conjugate-13 07/07/2016   Pneumococcal Polysaccharide-23 11/21/2003, 10/05/2017   Tdap 05/28/2016     Objective: Vital Signs: There were no vitals taken for this visit.   Physical Exam   Musculoskeletal Exam: ***  CDAI Exam: CDAI Score: -- Patient Global: --; Provider Global: -- Swollen: --; Tender: -- Joint Exam 11/09/2023   No joint exam has been documented for this visit   There is currently no information documented on the homunculus. Go to the Rheumatology activity and complete the homunculus joint exam.  Investigation: No additional findings.  Imaging: No results found.  Recent Labs: Lab Results  Component Value Date   WBC 14.6 (H) 06/27/2023   HGB 14.0 06/27/2023   PLT 331 06/27/2023   NA 144 06/27/2023   K 3.9 06/27/2023   CL 104 06/27/2023   CO2 32 06/27/2023   GLUCOSE 46 (L) 06/27/2023   BUN 12 06/27/2023   CREATININE 0.76 06/27/2023   BILITOT 0.3 06/27/2023  ALKPHOS 96 01/25/2021   AST 30 06/27/2023   ALT 32 (H) 06/27/2023   PROT 6.3 06/27/2023   ALBUMIN 3.5 01/25/2021   CALCIUM 9.4 06/27/2023   GFRAA 65 02/12/2021   QFTBGOLDPLUS NEGATIVE 03/27/2023    Speciality Comments: No specialty comments available.  Procedures:  No procedures performed Allergies: Influenza vaccines, Penicillins, Statins, and Sulfamethoxazole-trimethoprim   Assessment / Plan:     Visit Diagnoses: No diagnosis found.  Orders: No orders of the defined types were placed in this encounter.  No orders of the defined types were placed in this encounter.   Face-to-face time spent with patient was *** minutes. Greater than 50% of time was spent in counseling and coordination of care.  Follow-Up Instructions: No follow-ups on file.   Pollyann Savoy, MD  Note - This record has been created using Animal nutritionist.  Chart creation  errors have been sought, but Katherine not always  have been located. Such creation errors do not reflect on  the standard of medical care.

## 2023-11-09 ENCOUNTER — Encounter: Payer: Self-pay | Admitting: Rheumatology

## 2023-11-09 ENCOUNTER — Ambulatory Visit: Attending: Rheumatology | Admitting: Rheumatology

## 2023-11-09 ENCOUNTER — Other Ambulatory Visit: Payer: Self-pay | Admitting: *Deleted

## 2023-11-09 VITALS — BP 118/62 | HR 57 | Ht 66.0 in | Wt 201.0 lb

## 2023-11-09 DIAGNOSIS — Z79899 Other long term (current) drug therapy: Secondary | ICD-10-CM

## 2023-11-09 DIAGNOSIS — M51369 Other intervertebral disc degeneration, lumbar region without mention of lumbar back pain or lower extremity pain: Secondary | ICD-10-CM

## 2023-11-09 DIAGNOSIS — M810 Age-related osteoporosis without current pathological fracture: Secondary | ICD-10-CM | POA: Diagnosis not present

## 2023-11-09 DIAGNOSIS — M797 Fibromyalgia: Secondary | ICD-10-CM | POA: Diagnosis not present

## 2023-11-09 DIAGNOSIS — M0579 Rheumatoid arthritis with rheumatoid factor of multiple sites without organ or systems involvement: Secondary | ICD-10-CM

## 2023-11-09 DIAGNOSIS — Z8669 Personal history of other diseases of the nervous system and sense organs: Secondary | ICD-10-CM | POA: Diagnosis not present

## 2023-11-09 DIAGNOSIS — G4709 Other insomnia: Secondary | ICD-10-CM

## 2023-11-09 DIAGNOSIS — Z8639 Personal history of other endocrine, nutritional and metabolic disease: Secondary | ICD-10-CM

## 2023-11-09 DIAGNOSIS — E785 Hyperlipidemia, unspecified: Secondary | ICD-10-CM

## 2023-11-09 DIAGNOSIS — Z8659 Personal history of other mental and behavioral disorders: Secondary | ICD-10-CM

## 2023-11-09 DIAGNOSIS — Z8679 Personal history of other diseases of the circulatory system: Secondary | ICD-10-CM | POA: Diagnosis not present

## 2023-11-09 DIAGNOSIS — Z8709 Personal history of other diseases of the respiratory system: Secondary | ICD-10-CM | POA: Diagnosis not present

## 2023-11-09 DIAGNOSIS — B029 Zoster without complications: Secondary | ICD-10-CM

## 2023-11-09 DIAGNOSIS — Z8719 Personal history of other diseases of the digestive system: Secondary | ICD-10-CM

## 2023-11-09 DIAGNOSIS — M3501 Sicca syndrome with keratoconjunctivitis: Secondary | ICD-10-CM | POA: Diagnosis not present

## 2023-11-09 DIAGNOSIS — M170 Bilateral primary osteoarthritis of knee: Secondary | ICD-10-CM | POA: Diagnosis not present

## 2023-11-09 MED ORDER — XELJANZ 5 MG PO TABS: 1.0000 | ORAL_TABLET | Freq: Every day | ORAL | 0 refills | Status: DC

## 2023-11-09 NOTE — Patient Instructions (Addendum)
 Standing Labs We placed an order today for your standing lab work.   Please have your standing labs drawn in June and every 3 months  Please have your labs drawn 2 weeks prior to your appointment so that the provider can discuss your lab results at your appointment, if possible.  Please note that you may see your imaging and lab results in MyChart before we have reviewed them. We will contact you once all results are reviewed. Please allow our office up to 72 hours to thoroughly review all of the results before contacting the office for clarification of your results.  WALK-IN LAB HOURS  Monday through Thursday from 8:00 am -12:30 pm and 1:00 pm-5:00 pm and Friday from 8:00 am-12:00 pm.  Patients with office visits requiring labs will be seen before walk-in labs.  You may encounter longer than normal wait times. Please allow additional time. Wait times may be shorter on  Monday and Thursday afternoons.  We do not book appointments for walk-in labs. We appreciate your patience and understanding with our staff.   Labs are drawn by [facility]. Please bring your co-pay at the time of your lab draw.  You may receive a bill from [facility] for your lab work.  Please note if you are on Hydroxychloroquine and and an order has been placed for a Hydroxychloroquine level,  you will need to have it drawn 4 hours or more after your last dose.  If you wish to have your labs drawn at another location, please call the office 24 hours in advance so we can fax the orders.  The office is located at [address], [place], [place] [address]   If you have any questions regarding directions or hours of operation,  please call [phone].   As a reminder, please drink plenty of water prior to coming for your lab work. Thanks!   Vaccines You are taking a medication(s) that can suppress your immune system.  The following immunizations are recommended: Flu annually RSV Covid-19  Td/Tdap (tetanus,  diphtheria, pertussis) every 10 years Pneumonia (Prevnar 15 then Pneumovax 23 at least 1 year apart.  Alternatively, can take Prevnar 20 without needing additional dose) Shingrix: 2 doses from 4 weeks to 6 months apart  Please check with your PCP to make sure you are up to date.  If you have signs or symptoms of an infection or start antibiotics: First, call your PCP for workup of your infection. Hold your medication through the infection, until you complete your antibiotics, and until symptoms resolve if you take the following: Injectable medication (Actemra, Benlysta, Cimzia, Cosentyx, Enbrel, Humira, Kevzara, Orencia, Remicade, Simponi, Stelara, Taltz, Tremfya) Methotrexate Leflunomide (Arava) Mycophenolate (Cellcept) Osborne [place], or Rinvoq  Location management Because you are taking Xeljanz, Rinvoq, or Olumiant, it is very important to know that this class of medications has a FDA BLACK BOX WARNING for major adverse cardiovascular events (MACE), thrombosis, mortality (including sudden cardiovascular death), serious infections, and lymphomas. MACE is defined as cardiovascular death, myocardial infarction, and stroke. Thrombosis includes deep venous thrombosis (DVT), pulmonary embolism (PE), and arterial thrombosis. If you are a current or former smoker, you are at higher risk for MACE.

## 2023-11-10 LAB — COMPREHENSIVE METABOLIC PANEL
AG Ratio: 1.5 (calc) (ref 1.0–2.5)
ALT: 19 U/L (ref 6–29)
AST: 25 U/L (ref 10–35)
Albumin: 4.1 g/dL (ref 3.6–5.1)
Alkaline phosphatase (APISO): 110 U/L (ref 37–153)
BUN/Creatinine Ratio: 20 (calc) (ref 6–22)
BUN: 23 mg/dL (ref 7–25)
CO2: 28 mmol/L (ref 20–32)
Calcium: 9.2 mg/dL (ref 8.6–10.4)
Chloride: 102 mmol/L (ref 98–110)
Creat: 1.17 mg/dL — ABNORMAL HIGH (ref 0.60–0.95)
Globulin: 2.7 g/dL (ref 1.9–3.7)
Glucose, Bld: 126 mg/dL — ABNORMAL HIGH (ref 65–99)
Potassium: 4.5 mmol/L (ref 3.5–5.3)
Sodium: 139 mmol/L (ref 135–146)
Total Bilirubin: 0.4 mg/dL (ref 0.2–1.2)
Total Protein: 6.8 g/dL (ref 6.1–8.1)
eGFR: 47 mL/min/{1.73_m2} — ABNORMAL LOW (ref >=60)

## 2023-11-10 LAB — CBC WITH DIFFERENTIAL/PLATELET
Absolute Lymphocytes: 1566 {cells}/uL (ref 850–3900)
Absolute Monocytes: 1349 {cells}/uL — ABNORMAL HIGH (ref 200–950)
Basophils Absolute: 116 {cells}/uL (ref 0–200)
Basophils Relative: 0.8 %
Eosinophils Absolute: 508 {cells}/uL — ABNORMAL HIGH (ref 15–500)
Eosinophils Relative: 3.5 %
HCT: 43.4 % (ref 35.0–45.0)
Hemoglobin: 14 g/dL (ref 11.7–15.5)
MCH: 27.6 pg (ref 27.0–33.0)
MCHC: 32.3 g/dL (ref 32.0–36.0)
MCV: 85.4 fL (ref 80.0–100.0)
MPV: 11 fL (ref 7.5–12.5)
Monocytes Relative: 9.3 %
Neutro Abs: 10962 {cells}/uL — ABNORMAL HIGH (ref 1500–7800)
Neutrophils Relative %: 75.6 %
Platelets: 331 10*3/uL (ref 140–400)
RBC: 5.08 10*6/uL (ref 3.80–5.10)
RDW: 13.3 % (ref 11.0–15.0)
Total Lymphocyte: 10.8 %
WBC: 14.5 10*3/uL — ABNORMAL HIGH (ref 3.8–10.8)

## 2023-11-10 LAB — LIPID PANEL
Cholesterol: 147 mg/dL (ref <200)
HDL: 40 mg/dL — ABNORMAL LOW (ref >=50)
LDL Cholesterol (Calc): 77 mg/dL
Non-HDL Cholesterol (Calc): 107 mg/dL (ref <130)
Total CHOL/HDL Ratio: 3.7 (calc) (ref <5.0)
Triglycerides: 206 mg/dL — ABNORMAL HIGH (ref <150)

## 2023-11-10 NOTE — Progress Notes (Signed)
 White cell count remains elevated and stable.  Triglycerides remain elevated and HDL is low.  Glucose is elevated at 126.  Creatinine is elevated at 1.17.  Please forward results to her PCP.  No change in treatment advised.

## 2023-11-17 ENCOUNTER — Other Ambulatory Visit: Payer: Self-pay | Admitting: Rheumatology

## 2023-11-17 DIAGNOSIS — Z79899 Other long term (current) drug therapy: Secondary | ICD-10-CM

## 2023-11-17 DIAGNOSIS — M0579 Rheumatoid arthritis with rheumatoid factor of multiple sites without organ or systems involvement: Secondary | ICD-10-CM

## 2023-12-18 ENCOUNTER — Encounter: Admitting: Registered Nurse

## 2023-12-18 NOTE — Progress Notes (Deleted)
 Subjective:    Patient ID: Katherine Delgado, female    DOB: [birth year], 83 y.o.   MRN: [id number]  HPI   Pain Inventory Average Pain {NUMBERS; 0-10:5044} Pain Right Now {NUMBERS; 0-10:5044} My pain is {PAIN DESCRIPTION:21022940}  LOCATION OF PAIN  ***  BOWEL Number of stools per week: *** Oral laxative use {YES/NO:21197} Type of laxative *** Enema or suppository use {YES/NO:21197} History of colostomy {YES/NO:21197} Incontinent {YES/NO:21197}  BLADDER {bladder options:24190} In and out cath, frequency *** Able to self cath {YES/NO:21197} Bladder incontinence {YES/NO:21197} Frequent urination {YES/NO:21197} Leakage with coughing {YES/NO:21197} Difficulty starting stream {YES/NO:21197} Incomplete bladder emptying {YES/NO:21197}   Mobility {MOBILITY ION:62952841}  Function {FUNCTION:21022946}  Neuro/Psych {NEURO/PSYCH:21022948}  Prior Studies {CPRM PRIOR STUDIES:21022953}  Physicians involved in your care {CPRM PHYSICIANS INVOLVED IN YOUR CARE:21022954}   Family History  Problem Relation Age of Onset   Alzheimer's disease Mother    Heart disease Mother    Heart disease Father    Liver disease Father    Cancer Brother    Colon polyps Brother    Arthritis Son    Colon cancer Neg Hx    Esophageal cancer Neg Hx    Kidney disease Neg Hx    Stomach cancer Neg Hx    Rectal cancer Neg Hx    Social History   Socioeconomic History   Marital status: Widowed    Spouse name: Not on file   Number of children: 2   Years of education: 14   Highest education level: Not on file  Occupational History   Occupation: Retired  Tobacco Use   Smoking status: Former    Current packs/day: 0.00    Average packs/day: 0.1 packs/day for 10.0 years (1.0 ttl pk-yrs)    Types: Cigarettes    Start date: 12/06/1969    Quit date: 12/07/1979    Years since quitting: 44.0    Passive exposure: Current   Smokeless tobacco: Never  Vaping Use   Vaping status: Never Used   Substance and Sexual Activity   Alcohol  use: No    Alcohol /week: 0.0 standard drinks of alcohol    Drug use: No   Sexual activity: Not Currently  Other Topics Concern   Not on file  Social History Narrative   Walks with cane   Right handed    Caffeine use: Coffee (2 cups every morning)   Tea: sometimes   Soda: none   Social Drivers of Corporate investment banker Strain: Medium Risk (10/05/2017)   Overall Financial Resource Strain (CARDIA)    Difficulty of Paying Living Expenses: Somewhat hard  Food Insecurity: No Food Insecurity (10/25/2022)   Hunger Vital Sign    Worried About Running Out of Food in the Last Year: Never true    Ran Out of Food in the Last Year: Never true  Transportation Needs: No Transportation Needs (10/25/2022)   PRAPARE - Administrator, Civil Service (Medical): No    Lack of Transportation (Non-Medical): No  Physical Activity: Inactive (10/05/2017)   Exercise Vital Sign    Days of Exercise per Week: 0 days    Minutes of Exercise per Session: 0 min  Stress: Stress Concern Present (10/05/2017)   [employer] of Occupational Health - Occupational Stress Questionnaire    Feeling of Stress : Rather much  Social Connections: Moderately Isolated (10/05/2017)   Social Connection and Isolation Panel [NHANES]    Frequency of Communication with Friends and Family: More than three times a week  Frequency of Social Gatherings with Friends and Family: More than three times a week    Attends Religious Services: Never    Database administrator or Organizations: No    Attends Banker Meetings: Never    Marital Status: Widowed   Past Surgical History:  Procedure Laterality Date   ABDOMINAL HYSTERECTOMY  1974   abdominal tumor  2002   APPENDECTOMY     APPLICATION OF A-CELL OF BACK N/A 09/26/2018   Procedure: With Acell;  Surgeon: Thornell Flirt, DO;  [facility];  Service: Plastics;  Laterality: N/A;   APPLICATION OF WOUND VAC N/A  09/26/2018   Procedure: Vac placement;  Surgeon: Thornell Flirt, DO;  [facility];  Service: Plastics;  Laterality: N/A;   BACK SURGERY  1982   BRONCHOSCOPY  2001   CATARACT EXTRACTION, BILATERAL  09/2019, 10/2019   CHOLECYSTECTOMY  1984   INCISION AND DRAINAGE OF WOUND N/A 09/26/2018   Procedure: Debridement of spine wound;  Surgeon: Thornell Flirt, DO;  [facility];  Service: Plastics;  Laterality: N/A;   KNEE SURGERY Bilateral 08/27/2010 (L) and 01/11/2011 (R)   LUMBAR LAMINECTOMY/DECOMPRESSION MICRODISCECTOMY N/A 07/03/2018   Procedure: Lumbar Three to Lumbar Five Laminectomy;  Surgeon: Cannon Champion, MD;  [facility];  Service: Neurosurgery;  Laterality: N/A;   LUMBAR WOUND DEBRIDEMENT N/A 08/20/2018   Procedure: POSTERIOR LUMBAR SPINAL WOUND DEBRIDEMENT AND REVISION;  Surgeon: Cannon Champion, MD;  [facility];  Service: Neurosurgery;  Laterality: N/A;  POSTERIOR LUMBAR SPINAL WOUND REVISION   miscarrage  1962   OVARIAN CYST SURGERY  1968   thymus tumor     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   TONSILLECTOMY     Past Medical History:  Diagnosis Date   Abnormality of gait 04/19/2016   Acute infective polyneuritis (HCC) 2002   Allergic rhinitis due to pollen    Chronic pain syndrome    COPD (chronic obstructive pulmonary disease) (HCC)    chronic bronchitis   Depressive disorder, not elsewhere classified    Diabetes mellitus without complication (HCC)    Diaphragmatic hernia without mention of obstruction or gangrene    Dyslipidemia    Fibromyalgia    GERD (gastroesophageal reflux disease)    with h/o esophagitis   Guillain-Barre syndrome (HCC)    History of benign thymus tumor    Insomnia, unspecified    Lumbar spinal stenosis 03/30/2018   L4-5 level, severe   Miscarriage 1962   Mixed hyperlipidemia    Morbid obesity (HCC)    Osteoporosis    Pneumonia    Polyneuropathy in diabetes(357.2)    Rheumatoid arthritis(714.0)    Spondylosis, lumbosacral     Spontaneous ecchymoses    Type II or unspecified type diabetes mellitus with peripheral circulatory disorders, uncontrolled(250.72)    Unspecified chronic bronchitis (HCC)    Unspecified essential hypertension    Unspecified hypothyroidism    Unspecified pruritic disorder    Unspecified urinary incontinence    There were no vitals taken for this visit.  Opioid Risk Score:   Fall Risk Score:  `1  Depression screen [facility] 2/9     09/12/2023   10:25 AM 08/11/2023    9:27 AM 07/12/2023   10:59 AM 06/09/2023    9:28 AM 06/09/2023    9:23 AM 04/21/2023    8:26 AM 03/24/2023    8:51 AM  Depression screen PHQ 2/9  Decreased Interest 0 0 0 0 0 0 0  Down,  Depressed, Hopeless 0 0 1 0 0 0 0  PHQ - 2 Score 0 0 1 0 0 0 0    Review of Systems     Objective:   Physical Exam        Assessment & Plan:

## 2023-12-27 ENCOUNTER — Other Ambulatory Visit: Admitting: Family

## 2023-12-27 DIAGNOSIS — R600 Localized edema: Secondary | ICD-10-CM

## 2023-12-27 DIAGNOSIS — E876 Hypokalemia: Secondary | ICD-10-CM

## 2023-12-27 MED ORDER — FUROSEMIDE 20 MG PO TABS: ORAL_TABLET | ORAL | 3 refills | Status: DC

## 2023-12-27 MED ORDER — POTASSIUM CHLORIDE ER 10 MEQ PO CPCR: 20.0000 meq | ORAL_CAPSULE | Freq: Two times a day (BID) | ORAL | 0 refills | Status: DC

## 2023-12-27 NOTE — Telephone Encounter (Signed)
 Last Fill: Albuterol : Not on Current Med List     Furosemide : 03/15/22     Potassium: 08/01/22  Last OV: 07/12/23 Next OV: 03/08/24  Routing to provider for review/authorization.

## 2023-12-27 NOTE — Telephone Encounter (Signed)
 Copied from CRM [id number]. Topic: Clinical - Medication Refill >> Dec 27, 2023 12:25 PM Brynn Caras wrote: Medication: albuterol  (PROVENTIL  HFA;VENTOLIN  HFA) 108 (90 Base) MCG/ACT inhaler  furosemide  (LASIX ) 20 MG tablet  potassium chloride  (MICRO-K ) 10 MEQ CR capsule  Has the patient contacted their pharmacy? No (Agent: If no, request that the patient contact the pharmacy for the refill. If patient does not wish to contact the pharmacy document the reason why and proceed with request.) (Agent: If yes, when and what did the pharmacy advise?)  This is the patient's preferred pharmacy:  [id number] Jon[place] Nestle, [place] - [address] AT [facility] & [address] [place] [address] Phone: [phone] Fax: [phone]  [facility] - [place], [place] - [address] [place] [address] Phone: [phone] Fax: [phone]  Is this the correct pharmacy for this prescription? Yes If no, delete pharmacy and type the correct one.   Has the prescription been filled recently? No  Is the patient out of the medication? Yes  Has the patient been seen for an appointment in the last year OR does the patient have an upcoming appointment? Yes  Can we respond through MyChart? No  Agent: Please be advised that Rx refills may take up to 3 business days. We ask that you follow-up with your pharmacy.

## 2023-12-27 NOTE — Progress Notes (Signed)
 Subjective:    Patient ID: Katherine Delgado, female    DOB: [birth year], 83 y.o.   MRN: [id number]  HPI: Katherine Delgado is a 83 y.o. female who returns for follow up appointment for chronic pain and medication refill. She states her pain is located in her neck radiating into her bilateral shoulders L>R, lower back, bilateral hips and bilateral knee pain. She rates her pain 8. Her current exercise regime is walking with walker in her home short distances and performing stretching exercises.  Ms. Filipowicz Morphine  equivalent is 90.00 MME.   Oral Swab was Performed today.     Pain Inventory Average Pain 8 Pain Right Now 8 My pain is sharp, burning, dull, stabbing, tingling, and aching  In the last 24 hours, has pain interfered with the following? General activity 10 Relation with others 10 Enjoyment of life 10 What TIME of day is your pain at its worst? morning  and night Sleep (in general) Poor  Pain is worse with: walking, bending, sitting, inactivity, standing, and some activites Pain improves with: medication Relief from Meds: 5  Family History  Problem Relation Age of Onset   Alzheimer's disease Mother    Heart disease Mother    Heart disease Father    Liver disease Father    Cancer Brother    Colon polyps Brother    Arthritis Son    Colon cancer Neg Hx    Esophageal cancer Neg Hx    Kidney disease Neg Hx    Stomach cancer Neg Hx    Rectal cancer Neg Hx    Social History   Socioeconomic History   Marital status: Widowed    Spouse name: Not on file   Number of children: 2   Years of education: 14   Highest education level: Not on file  Occupational History   Occupation: Retired  Tobacco Use   Smoking status: Former    Current packs/day: 0.00    Average packs/day: 0.1 packs/day for 10.0 years (1.0 ttl pk-yrs)    Types: Cigarettes    Start date: 12/06/1969    Quit date: 12/07/1979    Years since quitting: 44.0    Passive exposure: Current   Smokeless tobacco: Never   Vaping Use   Vaping status: Never Used  Substance and Sexual Activity   Alcohol  use: No    Alcohol /week: 0.0 standard drinks of alcohol    Drug use: No   Sexual activity: Not Currently  Other Topics Concern   Not on file  Social History Narrative   Walks with cane   Right handed    Caffeine use: Coffee (2 cups every morning)   Tea: sometimes   Soda: none   Social Drivers of Corporate investment banker Strain: Medium Risk (10/05/2017)   Overall Financial Resource Strain (CARDIA)    Difficulty of Paying Living Expenses: Somewhat hard  Food Insecurity: No Food Insecurity (10/25/2022)   Hunger Vital Sign    Worried About Running Out of Food in the Last Year: Never true    Ran Out of Food in the Last Year: Never true  Transportation Needs: No Transportation Needs (10/25/2022)   PRAPARE - Administrator, Civil Service (Medical): No    Lack of Transportation (Non-Medical): No  Physical Activity: Inactive (10/05/2017)   Exercise Vital Sign    Days of Exercise per Week: 0 days    Minutes of Exercise per Session: 0 min  Stress: Stress Concern Present (10/05/2017)  [employer] of Occupational Health - Occupational Stress Questionnaire    Feeling of Stress : Rather much  Social Connections: Moderately Isolated (10/05/2017)   Social Connection and Isolation Panel [NHANES]    Frequency of Communication with Friends and Family: More than three times a week    Frequency of Social Gatherings with Friends and Family: More than three times a week    Attends Religious Services: Never    Database administrator or Organizations: No    Attends Banker Meetings: Never    Marital Status: Widowed   Past Surgical History:  Procedure Laterality Date   ABDOMINAL HYSTERECTOMY  1974   abdominal tumor  2002   APPENDECTOMY     APPLICATION OF A-CELL OF BACK N/A 09/26/2018   Procedure: With Acell;  Surgeon: Thornell Flirt, DO;  [facility]: [facility];  Service: Plastics;   Laterality: N/A;   APPLICATION OF WOUND VAC N/A 09/26/2018   Procedure: Vac placement;  Surgeon: Thornell Flirt, DO;  [facility];  Service: Plastics;  Laterality: N/A;   BACK SURGERY  1982   BRONCHOSCOPY  2001   CATARACT EXTRACTION, BILATERAL  09/2019, 10/2019   CHOLECYSTECTOMY  1984   INCISION AND DRAINAGE OF WOUND N/A 09/26/2018   Procedure: Debridement of spine wound;  Surgeon: Thornell Flirt, DO;  [facility];  Service: Plastics;  Laterality: N/A;   KNEE SURGERY Bilateral 08/27/2010 (L) and 01/11/2011 (R)   LUMBAR LAMINECTOMY/DECOMPRESSION MICRODISCECTOMY N/A 07/03/2018   Procedure: Lumbar Three to Lumbar Five Laminectomy;  Surgeon: Cannon Champion, MD;  [facility];  Service: Neurosurgery;  Laterality: N/A;   LUMBAR WOUND DEBRIDEMENT N/A 08/20/2018   Procedure: POSTERIOR LUMBAR SPINAL WOUND DEBRIDEMENT AND REVISION;  Surgeon: Cannon Champion, MD;  [facility];  Service: Neurosurgery;  Laterality: N/A;  POSTERIOR LUMBAR SPINAL WOUND REVISION   miscarrage  1962   OVARIAN CYST SURGERY  1968   thymus tumor     thymus tumor  10/2000   TONSILLECTOMY     Past Surgical History:  Procedure Laterality Date   ABDOMINAL HYSTERECTOMY  1974   abdominal tumor  2002   APPENDECTOMY     APPLICATION OF A-CELL OF BACK N/A 09/26/2018   Procedure: With Acell;  Surgeon: Thornell Flirt, DO;  [facility];  Service: Plastics;  Laterality: N/A;   APPLICATION OF WOUND VAC N/A 09/26/2018   Procedure: Vac placement;  Surgeon: Thornell Flirt, DO;  [facility];  Service: Plastics;  Laterality: N/A;   BACK SURGERY  1982   BRONCHOSCOPY  2001   CATARACT EXTRACTION, BILATERAL  09/2019, 10/2019   CHOLECYSTECTOMY  1984   INCISION AND DRAINAGE OF WOUND N/A 09/26/2018   Procedure: Debridement of spine wound;  Surgeon: Thornell Flirt, DO;  [facility];  Service: Plastics;  Laterality: N/A;   KNEE SURGERY Bilateral 08/27/2010 (L) and 01/11/2011 (R)   LUMBAR  LAMINECTOMY/DECOMPRESSION MICRODISCECTOMY N/A 07/03/2018   Procedure: Lumbar Three to Lumbar Five Laminectomy;  Surgeon: Cannon Champion, MD;  [facility];  Service: Neurosurgery;  Laterality: N/A;   LUMBAR WOUND DEBRIDEMENT N/A 08/20/2018   Procedure: POSTERIOR LUMBAR SPINAL WOUND DEBRIDEMENT AND REVISION;  Surgeon: Cannon Champion, MD;  [facility];  Service: Neurosurgery;  Laterality: N/A;  POSTERIOR LUMBAR SPINAL WOUND REVISION   miscarrage  1962   OVARIAN CYST SURGERY  1968   thymus tumor     thymus tumor  10/2000   TONSILLECTOMY  Past Medical History:  Diagnosis Date   Abnormality of gait 04/19/2016   Acute infective polyneuritis (HCC) 2002   Allergic rhinitis due to pollen    Chronic pain syndrome    COPD (chronic obstructive pulmonary disease) (HCC)    chronic bronchitis   Depressive disorder, not elsewhere classified    Diabetes mellitus without complication (HCC)    Diaphragmatic hernia without mention of obstruction or gangrene    Dyslipidemia    Fibromyalgia    GERD (gastroesophageal reflux disease)    with h/o esophagitis   Guillain-Barre syndrome (HCC)    History of benign thymus tumor    Insomnia, unspecified    Lumbar spinal stenosis 03/30/2018   L4-5 level, severe   Miscarriage 1962   Mixed hyperlipidemia    Morbid obesity (HCC)    Osteoporosis    Pneumonia    Polyneuropathy in diabetes(357.2)    Rheumatoid arthritis(714.0)    Spondylosis, lumbosacral    Spontaneous ecchymoses    Type II or unspecified type diabetes mellitus with peripheral circulatory disorders, uncontrolled(250.72)    Unspecified chronic bronchitis (HCC)    Unspecified essential hypertension    Unspecified hypothyroidism    Unspecified pruritic disorder    Unspecified urinary incontinence    BP 129/72   Pulse 62   Ht 5\' 6"  (1.676 m)   Wt 201 lb (91.2 kg) Comment: last recorded  SpO2 95%   BMI 32.44 kg/m   Opioid Risk Score:   Fall Risk Score:  `1  Depression  screen [facility] 2/9     12/28/2023   10:47 AM 09/12/2023   10:25 AM 08/11/2023    9:27 AM 07/12/2023   10:59 AM 06/09/2023    9:28 AM 06/09/2023    9:23 AM 04/21/2023    8:26 AM  Depression screen PHQ 2/9  Decreased Interest 0 0 0 0 0 0 0  Down, Depressed, Hopeless 0 0 0 1 0 0 0  PHQ - 2 Score 0 0 0 1 0 0 0     Review of Systems  Musculoskeletal:        Generalized all over  All other systems reviewed and are negative.      Objective:   Physical Exam Vitals and nursing note reviewed.  Constitutional:      Appearance: Normal appearance.  Neck:     Comments: Cervical Paraspinal Tenderness: C-5-C-6 Cardiovascular:     Rate and Rhythm: Normal rate and regular rhythm.     Pulses: Normal pulses.     Heart sounds: Normal heart sounds.  Pulmonary:     Effort: Pulmonary effort is normal.     Breath sounds: Normal breath sounds.  Musculoskeletal:     Comments: Normal Muscle Bulk and Muscle Testing Reveals:  Upper Extremities: Right: Full ROM and Muscle Strength 5/5 Left Upper Extremity: Decreased ROM 45 Degrees  and Muscle Strength 5/5 Bilateral AC Joint Tenderness  Lumbar Paraspinal Tenderness: L-4- L-5 Bilateral Greater Trochanter Tenderness Lower Extremities: Full ROM and Muscle Strength 5/5 Arrived in wheelchair     Skin:    General: Skin is warm and dry.  Neurological:     Mental Status: She is alert and oriented to person, place, and time.  Psychiatric:        Mood and Affect: Mood normal.        Behavior: Behavior normal.         Assessment & Plan:  1.Failed Back Syndrome: Continue HEP as Tolerated. Continue to Monitor. 12/28/2023 2.Lumbar Radiculitis/ Chronic Low  Back Pain without Sciatica: Continue Gabapentin . Continue to monitor. Continue HEP as Tolerated 12/28/2023 3.Bilateral  Greater Trochanter Bursitis: .Continue to Alternate Ice and Heat Therapy. Continue current medication regimen. Continue to monitor. 12/28/2023 4.Bilateral Primary OA:/ Both Knees   Continue HEP as Tolerated. Continue to Monitor. 12/28/2023 5.Insomnia: Continue Amitriptyline  . Continue to Monitor.12/28/2023 6.Chronic Pain Syndrome: Refilled: Oxycodone  10/325 mg one tablet every 4 hours as needed for pain #180. We will continue the opioid monitoring program, this consists of regular clinic visits, examinations, urine drug screen, pill counts as well as use of [place]  Controlled Substance Reporting system. A 12 month History has been reviewed on the   Controlled Substance Reporting System on 12/28/2023 7. Cervicalgia/ Cervical Radiculitis: Continue Gabapentin . Continue HEP as tolerated. Continue to monitor. 12/28/2023 8. Polyarthralgia: Continue HEP as tolerated. Continue to monitor. 12/28/2023 9. Polyneuropathy: Continue Gabapentin . Continue to Monitor. 12/28/2023   F/U in 1 month

## 2023-12-28 ENCOUNTER — Encounter: Payer: Self-pay | Admitting: Registered Nurse

## 2023-12-28 ENCOUNTER — Encounter: Attending: Physical Medicine and Rehabilitation | Admitting: Registered Nurse

## 2023-12-28 VITALS — BP 129/72 | HR 62 | Ht 66.0 in | Wt 201.0 lb

## 2023-12-28 DIAGNOSIS — Z79891 Long term (current) use of opiate analgesic: Secondary | ICD-10-CM | POA: Diagnosis not present

## 2023-12-28 DIAGNOSIS — M5412 Radiculopathy, cervical region: Secondary | ICD-10-CM | POA: Diagnosis not present

## 2023-12-28 DIAGNOSIS — M7061 Trochanteric bursitis, right hip: Secondary | ICD-10-CM | POA: Insufficient documentation

## 2023-12-28 DIAGNOSIS — M2550 Pain in unspecified joint: Secondary | ICD-10-CM | POA: Insufficient documentation

## 2023-12-28 DIAGNOSIS — G8929 Other chronic pain: Secondary | ICD-10-CM | POA: Diagnosis not present

## 2023-12-28 DIAGNOSIS — M542 Cervicalgia: Secondary | ICD-10-CM | POA: Diagnosis not present

## 2023-12-28 DIAGNOSIS — M25512 Pain in left shoulder: Secondary | ICD-10-CM | POA: Insufficient documentation

## 2023-12-28 DIAGNOSIS — M545 Low back pain, unspecified: Secondary | ICD-10-CM | POA: Diagnosis not present

## 2023-12-28 DIAGNOSIS — M7062 Trochanteric bursitis, left hip: Secondary | ICD-10-CM | POA: Diagnosis not present

## 2023-12-28 DIAGNOSIS — G894 Chronic pain syndrome: Secondary | ICD-10-CM | POA: Insufficient documentation

## 2023-12-28 DIAGNOSIS — G6289 Other specified polyneuropathies: Secondary | ICD-10-CM | POA: Diagnosis not present

## 2023-12-28 DIAGNOSIS — M170 Bilateral primary osteoarthritis of knee: Secondary | ICD-10-CM | POA: Insufficient documentation

## 2023-12-28 DIAGNOSIS — M25511 Pain in right shoulder: Secondary | ICD-10-CM | POA: Insufficient documentation

## 2023-12-28 DIAGNOSIS — Z5181 Encounter for therapeutic drug level monitoring: Secondary | ICD-10-CM | POA: Insufficient documentation

## 2023-12-28 DIAGNOSIS — M961 Postlaminectomy syndrome, not elsewhere classified: Secondary | ICD-10-CM | POA: Diagnosis not present

## 2023-12-28 MED ORDER — OXYCODONE-ACETAMINOPHEN 10-325 MG PO TABS: 1.0000 | ORAL_TABLET | ORAL | 0 refills | Status: DC | PRN

## 2023-12-29 ENCOUNTER — Other Ambulatory Visit: Payer: Self-pay | Admitting: Rheumatology

## 2023-12-29 DIAGNOSIS — Z79899 Other long term (current) drug therapy: Secondary | ICD-10-CM

## 2023-12-29 DIAGNOSIS — M0579 Rheumatoid arthritis with rheumatoid factor of multiple sites without organ or systems involvement: Secondary | ICD-10-CM

## 2023-12-29 NOTE — Telephone Encounter (Signed)
 Last Fill: 11/09/2023 (30 day supply)  Labs: 11/09/2023 White cell count remains elevated and stable.  Triglycerides remain elevated and HDL is low.  Glucose is elevated at 126.  Creatinine is elevated at 1.17.     TB Gold: 03/27/2023 Neg    Next Visit: 04/11/2024  Last Visit: 11/09/2023  [id number] arthritis involving multiple sites with positive rheumatoid factor   Current Dose per office note 11/09/2023: Xeljanz  5 mg p.o. daily   Okay to refill Xeljanz ?

## 2024-01-01 LAB — DRUG TOX MONITOR 1 W/CONF, ORAL FLD
Amphetamines: NEGATIVE ng/mL (ref <10)
Barbiturates: NEGATIVE ng/mL (ref <10)
Benzodiazepines: NEGATIVE ng/mL (ref <0.50)
Buprenorphine: NEGATIVE ng/mL (ref <0.10)
Cocaine: NEGATIVE ng/mL (ref <5.0)
Codeine: NEGATIVE ng/mL (ref <2.5)
Dihydrocodeine: NEGATIVE ng/mL (ref <2.5)
Fentanyl: NEGATIVE ng/mL (ref <0.10)
Heroin Metabolite: NEGATIVE ng/mL (ref <1.0)
Hydrocodone: NEGATIVE ng/mL (ref <2.5)
Hydromorphone: NEGATIVE ng/mL (ref <2.5)
MARIJUANA: NEGATIVE ng/mL (ref <2.5)
MDMA: NEGATIVE ng/mL (ref <10)
Meprobamate: NEGATIVE ng/mL (ref <2.5)
Methadone: NEGATIVE ng/mL (ref <5.0)
Morphine: NEGATIVE ng/mL (ref <2.5)
Nicotine Metabolite: NEGATIVE ng/mL (ref <5.0)
Norhydrocodone: NEGATIVE ng/mL (ref <2.5)
Noroxycodone: 31.5 ng/mL — ABNORMAL HIGH (ref <2.5)
Opiates: POSITIVE ng/mL — AB (ref <2.5)
Oxycodone: 197.5 ng/mL — ABNORMAL HIGH (ref <2.5)
Oxymorphone: NEGATIVE ng/mL (ref <2.5)
Phencyclidine: NEGATIVE ng/mL (ref <10)
Tapentadol: NEGATIVE ng/mL (ref <5.0)
Tramadol: NEGATIVE ng/mL (ref <5.0)
Zolpidem: NEGATIVE ng/mL (ref <5.0)

## 2024-01-01 LAB — DRUG TOX ALC METAB W/CON, ORAL FLD: Alcohol Metabolite: NEGATIVE ng/mL (ref <25)

## 2024-01-10 ENCOUNTER — Other Ambulatory Visit: Payer: Self-pay | Admitting: Family

## 2024-01-10 ENCOUNTER — Other Ambulatory Visit: Payer: Self-pay

## 2024-01-10 DIAGNOSIS — E1151 Type 2 diabetes mellitus with diabetic peripheral angiopathy without gangrene: Secondary | ICD-10-CM

## 2024-01-10 MED ORDER — SEMAGLUTIDE (2 MG/DOSE) 8 MG/3ML ~~LOC~~ SOPN: 2.0000 mg | PEN_INJECTOR | SUBCUTANEOUS | 1 refills | Status: AC

## 2024-01-10 MED ORDER — SEMAGLUTIDE (2 MG/DOSE) 8 MG/3ML ~~LOC~~ SOPN: 2.0000 mg | PEN_INJECTOR | SUBCUTANEOUS | 3 refills | Status: DC

## 2024-02-01 ENCOUNTER — Encounter: Attending: Physical Medicine and Rehabilitation | Admitting: Registered Nurse

## 2024-02-01 VITALS — BP 131/70 | HR 65 | Ht 66.0 in | Wt 199.0 lb

## 2024-02-01 DIAGNOSIS — M170 Bilateral primary osteoarthritis of knee: Secondary | ICD-10-CM | POA: Insufficient documentation

## 2024-02-01 DIAGNOSIS — M7062 Trochanteric bursitis, left hip: Secondary | ICD-10-CM | POA: Insufficient documentation

## 2024-02-01 DIAGNOSIS — M2550 Pain in unspecified joint: Secondary | ICD-10-CM | POA: Insufficient documentation

## 2024-02-01 DIAGNOSIS — M545 Low back pain, unspecified: Secondary | ICD-10-CM | POA: Diagnosis not present

## 2024-02-01 DIAGNOSIS — G6289 Other specified polyneuropathies: Secondary | ICD-10-CM | POA: Diagnosis not present

## 2024-02-01 DIAGNOSIS — Z79891 Long term (current) use of opiate analgesic: Secondary | ICD-10-CM | POA: Insufficient documentation

## 2024-02-01 DIAGNOSIS — M7061 Trochanteric bursitis, right hip: Secondary | ICD-10-CM | POA: Insufficient documentation

## 2024-02-01 DIAGNOSIS — M25511 Pain in right shoulder: Secondary | ICD-10-CM | POA: Diagnosis not present

## 2024-02-01 DIAGNOSIS — G8929 Other chronic pain: Secondary | ICD-10-CM | POA: Insufficient documentation

## 2024-02-01 DIAGNOSIS — M5412 Radiculopathy, cervical region: Secondary | ICD-10-CM | POA: Insufficient documentation

## 2024-02-01 DIAGNOSIS — M542 Cervicalgia: Secondary | ICD-10-CM | POA: Insufficient documentation

## 2024-02-01 DIAGNOSIS — M25512 Pain in left shoulder: Secondary | ICD-10-CM | POA: Diagnosis not present

## 2024-02-01 DIAGNOSIS — M961 Postlaminectomy syndrome, not elsewhere classified: Secondary | ICD-10-CM | POA: Insufficient documentation

## 2024-02-01 DIAGNOSIS — Z5181 Encounter for therapeutic drug level monitoring: Secondary | ICD-10-CM | POA: Insufficient documentation

## 2024-02-01 DIAGNOSIS — G894 Chronic pain syndrome: Secondary | ICD-10-CM | POA: Insufficient documentation

## 2024-02-01 MED ORDER — OXYCODONE-ACETAMINOPHEN 10-325 MG PO TABS: 1.0000 | ORAL_TABLET | ORAL | 0 refills | Status: DC | PRN

## 2024-02-01 NOTE — Progress Notes (Signed)
 Subjective:    Patient ID: Katherine Delgado, female    DOB: [birth year], 83 y.o.   MRN: [id number]  HPI: Katherine Delgado is a 83 y.o. female who returns for follow up appointment for chronic pain and medication refill. She states her pain is located in her neck radiating into her bilateral shoulders,lower back, bilateral hip pain  and bilateral knee pain. She also reports generalized joint pain and tingling and burning in her in her bilateral hands and bilateral feet. She  rates her pain 10. Her current exercise regime is walking short distances with her walker and performing stretching exercises.  Ms. Hacking Morphine  equivalent is 90.00 MME.   Last Oral Swab was Performed on 12/28/2023, it was consistent.     Pain Inventory Average Pain 8 Pain Right Now 10 My pain is sharp, burning, stabbing, tingling, and aching  In the last 24 hours, has pain interfered with the following? General activity 10 Relation with others 10 Enjoyment of life 10 What TIME of day is your pain at its worst? morning  and night Sleep (in general) Poor  Pain is worse with: walking, bending, sitting, and standing Pain improves with: rest, heat/ice, pacing activities, and medication Relief from Meds: 5  Family History  Problem Relation Age of Onset   Alzheimer's disease Mother    Heart disease Mother    Heart disease Father    Liver disease Father    Cancer Brother    Colon polyps Brother    Arthritis Son    Colon cancer Neg Hx    Esophageal cancer Neg Hx    Kidney disease Neg Hx    Stomach cancer Neg Hx    Rectal cancer Neg Hx    Social History   Socioeconomic History   Marital status: Widowed    Spouse name: Not on file   Number of children: 2   Years of education: 14   Highest education level: Not on file  Occupational History   Occupation: Retired  Tobacco Use   Smoking status: Former    Current packs/day: 0.00    Average packs/day: 0.1 packs/day for 10.0 years (1.0 ttl pk-yrs)    Types:  Cigarettes    Start date: 12/06/1969    Quit date: 12/07/1979    Years since quitting: 44.1    Passive exposure: Current   Smokeless tobacco: Never  Vaping Use   Vaping status: Never Used  Substance and Sexual Activity   Alcohol  use: No    Alcohol /week: 0.0 standard drinks of alcohol    Drug use: No   Sexual activity: Not Currently  Other Topics Concern   Not on file  Social History Narrative   Walks with cane   Right handed    Caffeine use: Coffee (2 cups every morning)   Tea: sometimes   Soda: none   Social Drivers of Corporate investment banker Strain: Medium Risk (10/05/2017)   Overall Financial Resource Strain (CARDIA)    Difficulty of Paying Living Expenses: Somewhat hard  Food Insecurity: No Food Insecurity (10/25/2022)   Hunger Vital Sign    Worried About Running Out of Food in the Last Year: Never true    Ran Out of Food in the Last Year: Never true  Transportation Needs: No Transportation Needs (10/25/2022)   PRAPARE - Administrator, Civil Service (Medical): No    Lack of Transportation (Non-Medical): No  Physical Activity: Inactive (10/05/2017)   Exercise Vital Sign    Days  of Exercise per Week: 0 days    Minutes of Exercise per Session: 0 min  Stress: Stress Concern Present (10/05/2017)   [employer] of Occupational Health - Occupational Stress Questionnaire    Feeling of Stress : Rather much  Social Connections: Moderately Isolated (10/05/2017)   Social Connection and Isolation Panel    Frequency of Communication with Friends and Family: More than three times a week    Frequency of Social Gatherings with Friends and Family: More than three times a week    Attends Religious Services: Never    Database administrator or Organizations: No    Attends Banker Meetings: Never    Marital Status: Widowed   Past Surgical History:  Procedure Laterality Date   ABDOMINAL HYSTERECTOMY  1974   abdominal tumor  2002   APPENDECTOMY      APPLICATION OF A-CELL OF BACK N/A 09/26/2018   Procedure: With Acell;  Surgeon: Lowery Estefana RAMAN, DO;  [facility]: [facility];  Service: Plastics;  Laterality: N/A;   APPLICATION OF WOUND VAC N/A 09/26/2018   Procedure: Vac placement;  Surgeon: Lowery Estefana RAMAN, DO;  [facility];  Service: Plastics;  Laterality: N/A;   BACK SURGERY  1982   BRONCHOSCOPY  2001   CATARACT EXTRACTION, BILATERAL  09/2019, 10/2019   CHOLECYSTECTOMY  1984   INCISION AND DRAINAGE OF WOUND N/A 09/26/2018   Procedure: Debridement of spine wound;  Surgeon: Lowery Estefana RAMAN, DO;  [facility];  Service: Plastics;  Laterality: N/A;   KNEE SURGERY Bilateral 08/27/2010 (L) and 01/11/2011 (R)   LUMBAR LAMINECTOMY/DECOMPRESSION MICRODISCECTOMY N/A 07/03/2018   Procedure: Lumbar Three to Lumbar Five Laminectomy;  Surgeon: Cheryle Debby LABOR, MD;  [facility];  Service: Neurosurgery;  Laterality: N/A;   LUMBAR WOUND DEBRIDEMENT N/A 08/20/2018   Procedure: POSTERIOR LUMBAR SPINAL WOUND DEBRIDEMENT AND REVISION;  Surgeon: Cheryle Debby LABOR, MD;  [facility];  Service: Neurosurgery;  Laterality: N/A;  POSTERIOR LUMBAR SPINAL WOUND REVISION   miscarrage  1962   OVARIAN CYST SURGERY  1968   thymus tumor     thymus tumor  10/2000   TONSILLECTOMY     Past Surgical History:  Procedure Laterality Date   ABDOMINAL HYSTERECTOMY  1974   abdominal tumor  2002   APPENDECTOMY     APPLICATION OF A-CELL OF BACK N/A 09/26/2018   Procedure: With Acell;  Surgeon: Lowery Estefana RAMAN, DO;  [facility];  Service: Plastics;  Laterality: N/A;   APPLICATION OF WOUND VAC N/A 09/26/2018   Procedure: Vac placement;  Surgeon: Lowery Estefana RAMAN, DO;  [facility];  Service: Plastics;  Laterality: N/A;   BACK SURGERY  1982   BRONCHOSCOPY  2001   CATARACT EXTRACTION, BILATERAL  09/2019, 10/2019   CHOLECYSTECTOMY  1984   INCISION AND DRAINAGE OF WOUND N/A 09/26/2018   Procedure: Debridement of spine wound;  Surgeon: Lowery Estefana RAMAN, DO;  [facility];  Service: Plastics;  Laterality: N/A;   KNEE SURGERY Bilateral 08/27/2010 (L) and 01/11/2011 (R)   LUMBAR LAMINECTOMY/DECOMPRESSION MICRODISCECTOMY N/A 07/03/2018   Procedure: Lumbar Three to Lumbar Five Laminectomy;  Surgeon: Cheryle Debby LABOR, MD;  [facility];  Service: Neurosurgery;  Laterality: N/A;   LUMBAR WOUND DEBRIDEMENT N/A 08/20/2018   Procedure: POSTERIOR LUMBAR SPINAL WOUND DEBRIDEMENT AND REVISION;  Surgeon: Cheryle Debby LABOR, MD;  [facility];  Service: Neurosurgery;  Laterality: N/A;  POSTERIOR LUMBAR SPINAL WOUND REVISION   miscarrage  [phone]  OVARIAN CYST SURGERY  1968   thymus tumor     thymus tumor  10/2000   TONSILLECTOMY     Past Medical History:  Diagnosis Date   Abnormality of gait 04/19/2016   Acute infective polyneuritis (HCC) 2002   Allergic rhinitis due to pollen    Chronic pain syndrome    COPD (chronic obstructive pulmonary disease) (HCC)    chronic bronchitis   Depressive disorder, not elsewhere classified    Diabetes mellitus without complication (HCC)    Diaphragmatic hernia without mention of obstruction or gangrene    Dyslipidemia    Fibromyalgia    GERD (gastroesophageal reflux disease)    with h/o esophagitis   Guillain-Barre syndrome (HCC)    History of benign thymus tumor    Insomnia, unspecified    Lumbar spinal stenosis 03/30/2018   L4-5 level, severe   Miscarriage 1962   Mixed hyperlipidemia    Morbid obesity (HCC)    Osteoporosis    Pneumonia    Polyneuropathy in diabetes(357.2)    Rheumatoid arthritis(714.0)    Spondylosis, lumbosacral    Spontaneous ecchymoses    Type II or unspecified type diabetes mellitus with peripheral circulatory disorders, uncontrolled(250.72)    Unspecified chronic bronchitis (HCC)    Unspecified essential hypertension    Unspecified hypothyroidism    Unspecified pruritic disorder    Unspecified urinary incontinence    BP 131/70   Pulse 65   Ht 5' 6 (1.676  m)   Wt 199 lb (90.3 kg)   SpO2 96%   BMI 32.12 kg/m   Opioid Risk Score:   Fall Risk Score:  `1  Depression screen [facility] 2/9     12/28/2023   10:47 AM 09/12/2023   10:25 AM 08/11/2023    9:27 AM 07/12/2023   10:59 AM 06/09/2023    9:28 AM 06/09/2023    9:23 AM 04/21/2023    8:26 AM  Depression screen PHQ 2/9  Decreased Interest 0 0 0 0 0 0 0  Down, Depressed, Hopeless 0 0 0 1 0 0 0  PHQ - 2 Score 0 0 0 1 0 0 0     Review of Systems  Musculoskeletal:  Positive for back pain, gait problem and myalgias.  All other systems reviewed and are negative.     Objective:   Physical Exam Vitals and nursing note reviewed.  Constitutional:      Appearance: Normal appearance.  Neck:     Comments: Cervical Paraspinal Tenderness: C-5-C-6 Cardiovascular:     Rate and Rhythm: Normal rate and regular rhythm.     Pulses: Normal pulses.     Heart sounds: Normal heart sounds.  Pulmonary:     Effort: Pulmonary effort is normal.     Breath sounds: Normal breath sounds.   Musculoskeletal:     Comments: Normal Muscle Bulk and Muscle Testing Reveals:  Upper Extremities: Right: Full ROM and Muscle Strength 5/5 Left Upper Extremity: Decreased ROM 90 Degrees and Muscle Strength 5/5 Bilateral AC Joint Tenderness Lumbar Paraspinal Tenderness: L-4-L-5 Bilateral Greater Trochanter Tenderness Lower Extremities: Full ROM and Muscle Strength 5/5 Arrived in wheelchair       Skin:    General: Skin is warm and dry.   Neurological:     Mental Status: She is alert and oriented to person, place, and time.   Psychiatric:        Mood and Affect: Mood normal.        Behavior: Behavior normal.  Assessment & Plan:  1.Failed Back Syndrome: Continue HEP as Tolerated. Continue to Monitor. 02/01/2024 2.Lumbar Radiculitis/ Chronic Low Back Pain without Sciatica: Continue Gabapentin . Continue to monitor. Continue HEP as Tolerated 02/01/2024 3.Bilateral  Greater Trochanter Bursitis: .Continue to  Alternate Ice and Heat Therapy. Continue current medication regimen. Continue to monitor. 02/01/2024 4.Bilateral Primary OA:/ Both Knees  Continue HEP as Tolerated. Continue to Monitor. 02/01/2024 5.Insomnia: Continue Amitriptyline  . Continue to Monitor.02/01/2024 6.Chronic Pain Syndrome: Refilled: Oxycodone  10/325 mg one tablet every 4 hours as needed for pain #180. We will continue the opioid monitoring program, this consists of regular clinic visits, examinations, urine drug screen, pill counts as well as use of [place]  Controlled Substance Reporting system. A 12 month History has been reviewed on the [place]  Controlled Substance Reporting System on 02/01/2024 7. Cervicalgia/ Cervical Radiculitis: Continue Gabapentin . Continue HEP as tolerated. Continue to monitor. 02/01/2024 8. Polyarthralgia: Continue HEP as tolerated. Continue to monitor. 02/01/2024 9. Polyneuropathy: Continue Gabapentin . Continue to Monitor. 02/01/2024   F/U in 1 month

## 2024-02-05 DIAGNOSIS — E1142 Type 2 diabetes mellitus with diabetic polyneuropathy: Secondary | ICD-10-CM | POA: Diagnosis not present

## 2024-02-11 ENCOUNTER — Encounter: Payer: Self-pay | Admitting: Registered Nurse

## 2024-02-29 NOTE — Progress Notes (Signed)
 Subjective:    Patient ID: Katherine Delgado, female    DOB: 09/25/1940, 83 y.o.   MRN: [id number]  HPI: Katherine Delgado is a 83 y.o. female who returns for follow up appointment for chronic pain and medication refill. She states her pain is located in her neck radiating into her bilateral shoulders, lower back radiating into her bilateral hips and bilateral lower extremities. She also reports bilateral knee pain and bilateral hands and bilateral feet. She  rates her pain 10. Her current exercise regime is walking and performing stretching exercises.  Katherine Delgado Morphine  equivalent is 90.00 MME.   Last Oral Swab was Performed on 12/28/2023, it was consistent.      Pain Inventory Average Pain 10 Pain Right Now 10 My pain is constant, sharp, burning, dull, stabbing, tingling, and aching  In the last 24 hours, has pain interfered with the following? General activity 10 Relation with others 10 Enjoyment of life 10 What TIME of day is your pain at its worst? morning  and night Sleep (in general) Poor  Pain is worse with: walking, bending, sitting, standing, and some activites Pain improves with: medication Relief from Meds: 4  Family History  Problem Relation Age of Onset   Alzheimer's disease Mother    Heart disease Mother    Heart disease Father    Liver disease Father    Cancer Brother    Colon polyps Brother    Arthritis Son    Colon cancer Neg Hx    Esophageal cancer Neg Hx    Kidney disease Neg Hx    Stomach cancer Neg Hx    Rectal cancer Neg Hx    Social History   Socioeconomic History   Marital status: Widowed    Spouse name: Not on file   Number of children: 2   Years of education: 14   Highest education level: Not on file  Occupational History   Occupation: Retired  Tobacco Use   Smoking status: Former    Current packs/day: 0.00    Average packs/day: 0.1 packs/day for 10.0 years (1.0 ttl pk-yrs)    Types: Cigarettes    Start date: 12/06/1969    Quit date:  12/07/1979    Years since quitting: 44.2    Passive exposure: Current   Smokeless tobacco: Never  Vaping Use   Vaping status: Never Used  Substance and Sexual Activity   Alcohol  use: No    Alcohol /week: 0.0 standard drinks of alcohol    Drug use: No   Sexual activity: Not Currently  Other Topics Concern   Not on file  Social History Narrative   Walks with cane   Right handed    Caffeine use: Coffee (2 cups every morning)   Tea: sometimes   Soda: none   Social Drivers of Corporate investment banker Strain: Medium Risk (10/05/2017)   Overall Financial Resource Strain (CARDIA)    Difficulty of Paying Living Expenses: Somewhat hard  Food Insecurity: No Food Insecurity (10/25/2022)   Hunger Vital Sign    Worried About Running Out of Food in the Last Year: Never true    Ran Out of Food in the Last Year: Never true  Transportation Needs: No Transportation Needs (10/25/2022)   PRAPARE - Administrator, Civil Service (Medical): No    Lack of Transportation (Non-Medical): No  Physical Activity: Inactive (10/05/2017)   Exercise Vital Sign    Days of Exercise per Week: 0 days    Minutes  of Exercise per Session: 0 min  Stress: Stress Concern Present (10/05/2017)   [employer] of Occupational Health - Occupational Stress Questionnaire    Feeling of Stress : Rather much  Social Connections: Moderately Isolated (10/05/2017)   Social Connection and Isolation Panel    Frequency of Communication with Friends and Family: More than three times a week    Frequency of Social Gatherings with Friends and Family: More than three times a week    Attends Religious Services: Never    Database administrator or Organizations: No    Attends Banker Meetings: Never    Marital Status: Widowed   Past Surgical History:  Procedure Laterality Date   ABDOMINAL HYSTERECTOMY  1974   abdominal tumor  2002   APPENDECTOMY     APPLICATION OF A-CELL OF BACK N/A 09/26/2018   Procedure: With  Acell;  Surgeon: Lowery Estefana RAMAN, DO;  Location: [facility];  Service: Plastics;  Laterality: N/A;   APPLICATION OF WOUND VAC N/A 09/26/2018   Procedure: Vac placement;  Surgeon: Lowery Estefana RAMAN, DO;  Location: [facility];  Service: Plastics;  Laterality: N/A;   BACK SURGERY  1982   BRONCHOSCOPY  2001   CATARACT EXTRACTION, BILATERAL  09/2019, 10/2019   CHOLECYSTECTOMY  1984   INCISION AND DRAINAGE OF WOUND N/A 09/26/2018   Procedure: Debridement of spine wound;  Surgeon: Lowery Estefana RAMAN, DO;  Location: [facility];  Service: Plastics;  Laterality: N/A;   KNEE SURGERY Bilateral 08/27/2010 (L) and 01/11/2011 (R)   LUMBAR LAMINECTOMY/DECOMPRESSION MICRODISCECTOMY N/A 07/03/2018   Procedure: Lumbar Three to Lumbar Five Laminectomy;  Surgeon: Cheryle Debby LABOR, MD;  Location: [facility];  Service: Neurosurgery;  Laterality: N/A;   LUMBAR WOUND DEBRIDEMENT N/A 08/20/2018   Procedure: POSTERIOR LUMBAR SPINAL WOUND DEBRIDEMENT AND REVISION;  Surgeon: Cheryle Debby LABOR, MD;  Location: [facility];  Service: Neurosurgery;  Laterality: N/A;  POSTERIOR LUMBAR SPINAL WOUND REVISION   miscarrage  1962   OVARIAN CYST SURGERY  1968   thymus tumor     thymus tumor  10/2000   TONSILLECTOMY     Past Surgical History:  Procedure Laterality Date   ABDOMINAL HYSTERECTOMY  1974   abdominal tumor  2002   APPENDECTOMY     APPLICATION OF A-CELL OF BACK N/A 09/26/2018   Procedure: With Acell;  Surgeon: Lowery Estefana RAMAN, DO;  Location: [facility];  Service: Plastics;  Laterality: N/A;   APPLICATION OF WOUND VAC N/A 09/26/2018   Procedure: Vac placement;  Surgeon: Lowery Estefana RAMAN, DO;  Location: [facility];  Service: Plastics;  Laterality: N/A;   BACK SURGERY  1982   BRONCHOSCOPY  2001   CATARACT EXTRACTION, BILATERAL  09/2019, 10/2019   CHOLECYSTECTOMY  1984   INCISION AND DRAINAGE OF WOUND N/A 09/26/2018   Procedure: Debridement of spine wound;  Surgeon: Lowery Estefana RAMAN, DO;  Location: [facility];  Service: Plastics;  Laterality:  N/A;   KNEE SURGERY Bilateral 08/27/2010 (L) and 01/11/2011 (R)   LUMBAR LAMINECTOMY/DECOMPRESSION MICRODISCECTOMY N/A 07/03/2018   Procedure: Lumbar Three to Lumbar Five Laminectomy;  Surgeon: Cheryle Debby LABOR, MD;  [facility]: [facility];  Service: Neurosurgery;  Laterality: N/A;   LUMBAR WOUND DEBRIDEMENT N/A 08/20/2018   Procedure: POSTERIOR LUMBAR SPINAL WOUND DEBRIDEMENT AND REVISION;  Surgeon: Cheryle Debby LABOR, MD;  Location: [facility];  Service: Neurosurgery;  Laterality: N/A;  POSTERIOR LUMBAR SPINAL WOUND REVISION   miscarrage  1962   OVARIAN CYST SURGERY  1968   thymus  tumor     thymus tumor  10/2000   TONSILLECTOMY     Past Medical History:  Diagnosis Date   Abnormality of gait 04/19/2016   Acute infective polyneuritis (HCC) 2002   Allergic rhinitis due to pollen    Chronic pain syndrome    COPD (chronic obstructive pulmonary disease) (HCC)    chronic bronchitis   Depressive disorder, not elsewhere classified    Diabetes mellitus without complication (HCC)    Diaphragmatic hernia without mention of obstruction or gangrene    Dyslipidemia    Fibromyalgia    GERD (gastroesophageal reflux disease)    with h/o esophagitis   Guillain-Barre syndrome (HCC)    History of benign thymus tumor    Insomnia, unspecified    Lumbar spinal stenosis 03/30/2018   L4-5 level, severe   Miscarriage 1962   Mixed hyperlipidemia    Morbid obesity (HCC)    Osteoporosis    Pneumonia    Polyneuropathy in diabetes(357.2)    Rheumatoid arthritis(714.0)    Spondylosis, lumbosacral    Spontaneous ecchymoses    Type II or unspecified type diabetes mellitus with peripheral circulatory disorders, uncontrolled(250.72)    Unspecified chronic bronchitis (HCC)    Unspecified essential hypertension    Unspecified hypothyroidism    Unspecified pruritic disorder    Unspecified urinary incontinence    There were no vitals taken for this visit.  Opioid Risk Score:   Fall Risk Score:  `1  Depression  screen [facility] 2/9     12/28/2023   10:47 AM 09/12/2023   10:25 AM 08/11/2023    9:27 AM 07/12/2023   10:59 AM 06/09/2023    9:28 AM 06/09/2023    9:23 AM 04/21/2023    8:26 AM  Depression screen PHQ 2/9  Decreased Interest 0 0 0 0 0 0 0  Down, Depressed, Hopeless 0 0 0 1 0 0 0  PHQ - 2 Score 0 0 0 1 0 0 0    Review of Systems  Musculoskeletal:  Positive for back pain, gait problem and neck pain.       Pain in all limbs, buttocks, arms, shoulders, hands  All other systems reviewed and are negative.      Objective:   Physical Exam Vitals and nursing note reviewed.  Constitutional:      Appearance: Normal appearance.  Neck:     Comments: Cervical Paraspinal Tenderness: C-5-- C-6 Cardiovascular:     Rate and Rhythm: Normal rate and regular rhythm.     Pulses: Normal pulses.     Heart sounds: Normal heart sounds.  Pulmonary:     Effort: Pulmonary effort is normal.     Breath sounds: Normal breath sounds.  Musculoskeletal:     Right lower leg: Edema present.     Left lower leg: Edema present.     Comments: Normal Muscle Bulk and Muscle Testing Reveals:  Upper Extremities: Right: Full ROM and Muscle Strength 5/5 Left Upper Extremity: Decreased ROM 90 Degrees and Muscle Strength 4/5 Bilateral AC Joint Tenderness  Lumbar Hypersensitivity Bilateral Greater Trochanter Tenderness Lower Extremities : Full ROM and Muscle Strength 5/5 Arrived in wheelchair     Skin:    General: Skin is warm and dry.  Neurological:     Mental Status: She is alert and oriented to person, place, and time.  Psychiatric:        Mood and Affect: Mood normal.        Behavior: Behavior normal.  Assessment & Plan:  1.Failed Back Syndrome: Continue HEP as Tolerated. Continue to Monitor. 03/01/2024 2.Lumbar Radiculitis/ Chronic Low Back Pain without Sciatica: Continue Gabapentin . Continue to monitor. Continue HEP as Tolerated 03/01/2024 3.Bilateral  Greater Trochanter Bursitis: .Continue to  Alternate Ice and Heat Therapy. Continue current medication regimen. Continue to monitor. 03/01/2024 4.Bilateral Primary OA:/ Both Knees  Continue HEP as Tolerated. Continue to Monitor. 03/01/2024 5.Insomnia: Continue Amitriptyline  . Continue to Monitor.03/01/2024 6.Chronic Pain Syndrome: Refilled: Oxycodone  10/325 mg one tablet every 4 hours as needed for pain #180. We will continue the opioid monitoring program, this consists of regular clinic visits, examinations, urine drug screen, pill counts as well as use of [place]  Controlled Substance Reporting system. A 12 month History has been reviewed on the [place]  Controlled Substance Reporting System on 03/01/2024 7. Cervicalgia/ Cervical Radiculitis: Continue Gabapentin . Continue HEP as tolerated. Continue to monitor. 03/01/2024 8. Polyarthralgia: Continue HEP as tolerated. Continue to monitor. 03/01/2024 9. Polyneuropathy: Continue Gabapentin . Continue to Monitor. 03/01/2024   F/U in 1 month

## 2024-03-01 ENCOUNTER — Encounter: Payer: Self-pay | Admitting: Registered Nurse

## 2024-03-01 ENCOUNTER — Encounter: Attending: Physical Medicine and Rehabilitation | Admitting: Registered Nurse

## 2024-03-01 VITALS — BP 135/69 | HR 66 | Ht 66.0 in | Wt 215.0 lb

## 2024-03-01 DIAGNOSIS — M25511 Pain in right shoulder: Secondary | ICD-10-CM | POA: Diagnosis not present

## 2024-03-01 DIAGNOSIS — M961 Postlaminectomy syndrome, not elsewhere classified: Secondary | ICD-10-CM | POA: Diagnosis not present

## 2024-03-01 DIAGNOSIS — M5416 Radiculopathy, lumbar region: Secondary | ICD-10-CM | POA: Diagnosis not present

## 2024-03-01 DIAGNOSIS — G8929 Other chronic pain: Secondary | ICD-10-CM | POA: Insufficient documentation

## 2024-03-01 DIAGNOSIS — M170 Bilateral primary osteoarthritis of knee: Secondary | ICD-10-CM | POA: Insufficient documentation

## 2024-03-01 DIAGNOSIS — M542 Cervicalgia: Secondary | ICD-10-CM | POA: Diagnosis not present

## 2024-03-01 DIAGNOSIS — M7061 Trochanteric bursitis, right hip: Secondary | ICD-10-CM | POA: Diagnosis not present

## 2024-03-01 DIAGNOSIS — M5412 Radiculopathy, cervical region: Secondary | ICD-10-CM | POA: Diagnosis not present

## 2024-03-01 DIAGNOSIS — G894 Chronic pain syndrome: Secondary | ICD-10-CM | POA: Diagnosis not present

## 2024-03-01 DIAGNOSIS — M2550 Pain in unspecified joint: Secondary | ICD-10-CM | POA: Diagnosis not present

## 2024-03-01 DIAGNOSIS — G6289 Other specified polyneuropathies: Secondary | ICD-10-CM | POA: Insufficient documentation

## 2024-03-01 DIAGNOSIS — Z5181 Encounter for therapeutic drug level monitoring: Secondary | ICD-10-CM | POA: Diagnosis not present

## 2024-03-01 DIAGNOSIS — M7062 Trochanteric bursitis, left hip: Secondary | ICD-10-CM | POA: Insufficient documentation

## 2024-03-01 DIAGNOSIS — M25512 Pain in left shoulder: Secondary | ICD-10-CM | POA: Insufficient documentation

## 2024-03-01 MED ORDER — OXYCODONE-ACETAMINOPHEN 10-325 MG PO TABS: 1.0000 | ORAL_TABLET | ORAL | 0 refills | Status: DC | PRN

## 2024-03-06 DIAGNOSIS — E119 Type 2 diabetes mellitus without complications: Secondary | ICD-10-CM | POA: Diagnosis not present

## 2024-03-08 ENCOUNTER — Encounter: Payer: Medicare Other | Admitting: Nurse Practitioner

## 2024-03-12 ENCOUNTER — Ambulatory Visit: Payer: Self-pay | Admitting: *Deleted

## 2024-03-12 ENCOUNTER — Telehealth: Payer: Self-pay

## 2024-03-12 NOTE — Telephone Encounter (Signed)
 Contacted patient to advise that [employer] was trying to get in touch with her. Patient stated she was going to call them back they had left her a voicemail on her phone.

## 2024-03-12 NOTE — Telephone Encounter (Signed)
 Agree with urgent care for evaluation if no available appointments.

## 2024-03-12 NOTE — Telephone Encounter (Signed)
 FYI Only or Action Required?: FYI only for provider.  Patient was last seen in primary care on 07/12/2023 by Medina-Vargas, Jereld BROCKS, NP.  Called Nurse Triage reporting Dysuria.  Symptoms began several weeks ago.  Interventions attempted: Rest, hydration, or home remedies.  Symptoms are: gradually worsening.  Triage Disposition: See HCP Within 4 Hours (Or PCP Triage)  Patient/caregiver understands and will follow disposition?: Unsure   Reason for Disposition  [1] SEVERE pain with urination (e.g., excruciating) AND [2] not improved after 2 hours of pain medicine  [1] MODERATE leg swelling (e.g., swelling extends up to knees) AND [2] new-onset or getting worse  Answer Assessment - Initial Assessment Questions 1. SEVERITY: How bad is the pain?  (e.g., Scale 1-10; mild, moderate, or severe)     severe 2. FREQUENCY: How many times have you had painful urination today?      Every time- frequency 3. PATTERN: Is pain present every time you urinate or just sometimes?      Every time 4. ONSET: When did the painful urination start?      Frequent UTI 5. FEVER: Do you have a fever? If Yes, ask: What is your temperature, how was it measured, and when did it start?     no 6. PAST UTI: Have you had a urine infection before? If Yes, ask: When was the last time? and What happened that time?      chronic 7. CAUSE: What do you think is causing the painful urination?  (e.g., UTI, scratch, Herpes sore)     UTI 8. OTHER SYMPTOMS: Do you have any other symptoms? (e.g., blood in urine, flank pain, genital sores, urgency, vaginal discharge)     Urgency,back pain  Answer Assessment - Initial Assessment Questions 1. ONSET: When did the swelling start? (e.g., minutes, hours, days)     2 weeks- worse 2. LOCATION: What part of the leg is swollen?  Are both legs swollen or just one leg?     Feet/legs- bilateral 3. SEVERITY: How bad is the swelling? (e.g., localized; mild,  moderate, severe)     Below the knee- may be in thighs 4. REDNESS: Is there redness or signs of infection?     Redness-halfway up the calve 5. PAIN: Is the swelling painful to touch? If Yes, ask: How painful is it?   (Scale 1-10; mild, moderate or severe)     Tight, more painful at redness 6. FEVER: Do you have a fever? If Yes, ask: What is it, how was it measured, and when did it start?      no 7. CAUSE: What do you think is causing the leg swelling?     Unsure- fluid retention 8. MEDICAL HISTORY: Do you have a history of blood clots (e.g., DVT), cancer, heart failure, kidney disease, or liver failure?     A-fib 9. RECURRENT SYMPTOM: Have you had leg swelling before? If Yes, ask: When was the last time? What happened that time?     Not this bad- mainly the feet 10. OTHER SYMPTOMS: Do you have any other symptoms? (e.g., chest pain, difficulty breathing)       no  Protocols used: Urination Pain - Female-A-AH, Leg Swelling and Edema-A-AH    Copied from CRM #[id number]. Topic: Clinical - Red Word Triage >> Mar 12, 2024  4:09 PM Zane F wrote: Red Word that prompted transfer to Nurse Triage:   Possible UTI (symptoms: frequency, burning when urinating);Swelling in feet and legs

## 2024-03-16 ENCOUNTER — Other Ambulatory Visit: Payer: Self-pay | Admitting: Family

## 2024-03-16 DIAGNOSIS — I10 Essential (primary) hypertension: Secondary | ICD-10-CM

## 2024-03-16 DIAGNOSIS — F331 Major depressive disorder, recurrent, moderate: Secondary | ICD-10-CM

## 2024-03-18 NOTE — Telephone Encounter (Signed)
 High Risk Warning Populated when attempting to refill, I will send to Provider for further review

## 2024-03-20 ENCOUNTER — Encounter: Payer: Self-pay | Admitting: Family

## 2024-03-20 ENCOUNTER — Ambulatory Visit (INDEPENDENT_AMBULATORY_CARE_PROVIDER_SITE_OTHER): Admitting: Family

## 2024-03-20 VITALS — BP 120/58 | HR 62 | Temp 97.9°F | Ht 66.0 in | Wt 224.2 lb

## 2024-03-20 DIAGNOSIS — R350 Frequency of micturition: Secondary | ICD-10-CM | POA: Diagnosis not present

## 2024-03-20 DIAGNOSIS — R600 Localized edema: Secondary | ICD-10-CM | POA: Diagnosis not present

## 2024-03-20 DIAGNOSIS — Z794 Long term (current) use of insulin: Secondary | ICD-10-CM | POA: Diagnosis not present

## 2024-03-20 DIAGNOSIS — B372 Candidiasis of skin and nail: Secondary | ICD-10-CM

## 2024-03-20 DIAGNOSIS — N3281 Overactive bladder: Secondary | ICD-10-CM

## 2024-03-20 DIAGNOSIS — J449 Chronic obstructive pulmonary disease, unspecified: Secondary | ICD-10-CM

## 2024-03-20 DIAGNOSIS — E1142 Type 2 diabetes mellitus with diabetic polyneuropathy: Secondary | ICD-10-CM | POA: Diagnosis not present

## 2024-03-20 LAB — POCT URINALYSIS DIPSTICK
Bilirubin, UA: NEGATIVE
Glucose, UA: NEGATIVE
Ketones, UA: NEGATIVE
Nitrite, UA: POSITIVE
Protein, UA: POSITIVE — AB
Spec Grav, UA: 1.02 (ref 1.010–1.025)
Urobilinogen, UA: 0.2 U/dL — NL
pH, UA: 6 (ref 5.0–8.0)

## 2024-03-20 MED ORDER — NYSTATIN 100000 UNIT/GM EX POWD: 1.0000 | Freq: Three times a day (TID) | CUTANEOUS | 3 refills | Status: DC

## 2024-03-20 MED ORDER — CIPROFLOXACIN HCL 500 MG PO TABS: 500.0000 mg | ORAL_TABLET | Freq: Two times a day (BID) | ORAL | 0 refills | Status: AC

## 2024-03-20 MED ORDER — MIRABEGRON ER 50 MG PO TB24
50.0000 mg | ORAL_TABLET | Freq: Every day | ORAL | 1 refills | Status: AC
Start: 2024-03-20 — End: ?

## 2024-03-20 NOTE — Progress Notes (Signed)
 Provider: Roxan Plough FNP-C   Katherine Delgado, Roxan BROCKS, NP  Patient Care Team: Devika Dragovich, Roxan BROCKS, NP as PCP - General (Family Medicine) Dolphus Reiter, MD (Rheumatology) Altheimer, Ozell, MD as Attending Physician (Endocrinology) Harden Jerona GAILS, MD as Consulting Physician (Orthopedic Surgery)  Extended Emergency Contact Information Primary Emergency Contact: [facility] Address: [address]          [place], [place] [address] [place] Home Phone: [phone] Relation: Son Secondary Emergency Contact: Jens Dorn JACOBS, [place] Phone: [phone] Relation: Son  Code Status:  Full Code  Goals of care: Advanced Directive information    07/12/2023   10:59 AM  Advanced Directives  Does Patient Have a Medical Advance Directive? No  Would patient like information on creating a medical advance directive? No - Patient declined     Chief Complaint  Patient presents with   Urinary Tract Infection    Patient states that she has been having lower back pain as well as burning and urine frequency for about three weeks now. Patient wants to talk about lowering medication dosage.      Discussed the use of AI scribe software for clinical note transcription with the patient, who gave verbal consent to proceed.  History of Present Illness   Katherine Delgado is an 83 year old female with recurrent urinary tract infections and incontinence who presents with urinary symptoms and back pain.  She experiences significant dysuria, described as a cramp, which she associates with recurrent urinary tract infections. The symptoms have been persistent, with temporary relief after treatment, but they do not completely resolve and tend to recur. She also reports back pain and is concerned about a potential kidney problem. She experiences urinary frequency and has been dealing with incontinence for five years.  She has a history of  overactive bladder and was previously taking mirabegron  (Myrbetriq ), which she has stopped.  Her blood sugars have not been well-controlled recently, which can contribute to increased urination. She uses the G7 to monitor her blood sugar levels.  She reports swelling in her feet and legs, indicating fluid retention. This has been ongoing for about three weeks, and she has been using furosemide  and potassium to manage it, along with elevating her legs. She has been unable to wear compression stockings due to the swelling. She takes furosemide  20 mg twice daily for three days as needed for weight gain.  She experiences some congestion and a feeling of her head being 'stopped up', but she has not had a fever. She has COPD, which worsens when other conditions flare up.  She has a history of skin irritation under her breasts and in the groin area, which has improved with the use of nystatin  powder. However, when one health issue arises, others tend to follow.    Past Medical History:  Diagnosis Date   Abnormality of gait 04/19/2016   Acute infective polyneuritis (HCC) 2002   Allergic rhinitis due to pollen    Chronic pain syndrome    COPD (chronic obstructive pulmonary disease) (HCC)    chronic bronchitis   Depressive disorder, not elsewhere classified    Diabetes mellitus without complication (HCC)    Diaphragmatic hernia without mention of obstruction or gangrene    Dyslipidemia    Fibromyalgia    GERD (gastroesophageal reflux disease)    with h/o esophagitis   Guillain-Barre syndrome (HCC)    History of benign thymus tumor  Insomnia, unspecified    Lumbar spinal stenosis 03/30/2018   L4-5 level, severe   Miscarriage 1962   Mixed hyperlipidemia    Morbid obesity (HCC)    Osteoporosis    Pneumonia    Polyneuropathy in diabetes(357.2)    Rheumatoid arthritis(714.0)    Spondylosis, lumbosacral    Spontaneous ecchymoses    Type II or unspecified type diabetes mellitus with peripheral  circulatory disorders, uncontrolled(250.72)    Unspecified chronic bronchitis (HCC)    Unspecified essential hypertension    Unspecified hypothyroidism    Unspecified pruritic disorder    Unspecified urinary incontinence    Past Surgical History:  Procedure Laterality Date   ABDOMINAL HYSTERECTOMY  1974   abdominal tumor  2002   APPENDECTOMY     APPLICATION OF A-CELL OF BACK N/A 09/26/2018   Procedure: With Acell;  Surgeon: Lowery Estefana RAMAN, DO;  Location: [facility] OR;  Service: Plastics;  Laterality: N/A;   APPLICATION OF WOUND VAC N/A 09/26/2018   Procedure: Vac placement;  Surgeon: Lowery Estefana RAMAN, DO;  Location: [facility] OR;  Service: Plastics;  Laterality: N/A;   BACK SURGERY  1982   BRONCHOSCOPY  2001   CATARACT EXTRACTION, BILATERAL  09/2019, 10/2019   CHOLECYSTECTOMY  1984   INCISION AND DRAINAGE OF WOUND N/A 09/26/2018   Procedure: Debridement of spine wound;  Surgeon: Lowery Estefana RAMAN, DO;  Location: [facility] OR;  Service: Plastics;  Laterality: N/A;   KNEE SURGERY Bilateral 08/27/2010 (L) and 01/11/2011 (R)   LUMBAR LAMINECTOMY/DECOMPRESSION MICRODISCECTOMY N/A 07/03/2018   Procedure: Lumbar Three to Lumbar Five Laminectomy;  Surgeon: Cheryle Debby LABOR, MD;  Location: [facility] OR;  Service: Neurosurgery;  Laterality: N/A;   LUMBAR WOUND DEBRIDEMENT N/A 08/20/2018   Procedure: POSTERIOR LUMBAR SPINAL WOUND DEBRIDEMENT AND REVISION;  Surgeon: Cheryle Debby LABOR, MD;  Location: [facility] OR;  Service: Neurosurgery;  Laterality: N/A;  POSTERIOR LUMBAR SPINAL WOUND REVISION   miscarrage  1962   OVARIAN CYST SURGERY  1968   thymus tumor     thymus tumor  10/2000   TONSILLECTOMY      Allergies  Allergen Reactions   Influenza Vaccines Other (See Comments)    h/o Guillain Barre; dr's told me years ago never to take another flu shot as it could relapse Julee Birch; has to do with when vaccine being changed to H1N1 virus   Penicillins Hives, Itching, Swelling and Rash    Has patient had a PCN  reaction causing immediate rash, facial/tongue/throat swelling, SOB or lightheadedness with hypotension:Unknown Has patient had a PCN reaction causing severe rash involving mucus membranes or skin necrosis: Unknown Has patient had a PCN reaction that required hospitalization:Unknown Has patient had a PCN reaction occurring within the last 10 years: Unknown If all of the above answers are NO, then may proceed with Cephalosporin use.    Statins Other (See Comments)    Myopathy, transaminitis   Sulfamethoxazole -Trimethoprim  Itching    Allergies as of 03/20/2024       Reactions   Influenza Vaccines Other (See Comments)   h/o Guillain Barre; dr's told me years ago never to take another flu shot as it could relapse Julee Birch; has to do with when vaccine being changed to H1N1 virus   Penicillins Hives, Itching, Swelling, Rash   Has patient had a PCN reaction causing immediate rash, facial/tongue/throat swelling, SOB or lightheadedness with hypotension:Unknown Has patient had a PCN reaction causing severe rash involving mucus membranes or skin necrosis: Unknown Has patient had a PCN reaction  that required hospitalization:Unknown Has patient had a PCN reaction occurring within the last 10 years: Unknown If all of the above answers are NO, then may proceed with Cephalosporin use.   Statins Other (See Comments)   Myopathy, transaminitis   Sulfamethoxazole -trimethoprim  Itching        Medication List        Accurate as of March 20, 2024 11:13 AM. If you have any questions, ask your nurse or doctor.          STOP taking these medications    calamine lotion Stopped by: Dajae Kizer C Loyd Marhefka       TAKE these medications    amitriptyline  50 MG tablet Commonly known as: ELAVIL  Take 1 tablet (50 mg total) by mouth at bedtime.   amLODipine  10 MG tablet Commonly known as: NORVASC  TAKE 1 TABLET(10 MG) BY MOUTH DAILY   artificial tears ophthalmic solution Place 1 drop into both  eyes 4 (four) times daily as needed (for dry/irritated eyes).   aspirin EC 81 MG tablet Take 81 mg by mouth 2 (two) times a week.   augmented betamethasone  dipropionate 0.05 % cream Commonly known as: DIPROLENE -AF Apply topically 2 (two) times daily as needed.   B-D UF III MINI PEN NEEDLES 31G X 5 MM Misc Generic drug: Insulin  Pen Needle 1 Device by Other route in the morning, at noon, in the evening, and at bedtime.   cholecalciferol 25 MCG (1000 UNIT) tablet Commonly known as: VITAMIN D3 Take 2,000 Units by mouth daily.   ciprofloxacin  500 MG tablet Commonly known as: Cipro  Take 1 tablet (500 mg total) by mouth 2 (two) times daily for 7 days. Started by: Roxan BROCKS Jermon Chalfant   Cranberry 475 MG Caps Take 1 capsule (475 mg total) by mouth 2 (two) times daily.   Dexcom G7 Sensor Misc 1 Device by Does not apply route as directed. What changed: Another medication with the same name was removed. Continue taking this medication, and follow the directions you see here. Changed by: Aubrianne Molyneux C Marcelino Campos   docusate sodium  100 MG capsule Commonly known as: COLACE Take 100 mg by mouth daily as needed for mild constipation.   DULoxetine  20 MG capsule Commonly known as: CYMBALTA  TAKE 1 CAPSULE(20 MG) BY MOUTH DAILY   ezetimibe  10 MG tablet Commonly known as: ZETIA  Take 1 tablet (10 mg total) by mouth daily.   fluconazole  150 MG tablet Commonly known as: DIFLUCAN  Repeat in 72 hours   fluticasone  0.05 % cream Commonly known as: CUTIVATE  Apply topically 2 (two) times daily as needed.   fluticasone  50 MCG/ACT nasal spray Commonly known as: FLONASE  SHAKE LIQUID AND USE 2 SPRAYS IN EACH NOSTRIL AT BEDTIME What changed: See the new instructions.   folic acid  1 MG tablet Commonly known as: FOLVITE  Take 2 tablets (2 mg total) by mouth daily.   furosemide  20 MG tablet Commonly known as: LASIX  TAKE 1 BY MOUTH TWICE DAILY FOR 3 DAYS AS NEEDED FOR 3 POUND WEIGHT GAIN IN ONE DAY OR 5 POUNDS  IN ONE WEEK   gabapentin  600 MG tablet Commonly known as: NEURONTIN  TAKE 1 TABLET(600 MG) BY MOUTH THREE TIMES DAILY   HumaLOG  KwikPen 200 UNIT/ML KwikPen Generic drug: insulin  lispro Max daily 30 units   ICY HOT MEDICATED SPRAY EX Apply 1 application topically as needed (shoulder pain).   Iodosorb 0.9 % gel Generic drug: cadexomer iodine  Apply 1 Application topically daily as needed for wound care.   ipratropium-albuterol  0.5-2.5 (3) MG/3ML Soln Commonly  known as: DUONEB Take 3 mLs by nebulization every 6 (six) hours as needed.   ketoconazole  2 % cream Commonly known as: NIZORAL  Apply 1 Application topically 2 (two) times daily. What changed:  when to take this reasons to take this   levothyroxine  100 MCG tablet Commonly known as: SYNTHROID  Take 1 tablet (100 mcg total) by mouth daily before breakfast.   lisinopril  20 MG tablet Commonly known as: ZESTRIL  TAKE 1 TABLET BY MOUTH EVERY DAY   loperamide 2 MG tablet Commonly known as: IMODIUM A-D Take 2 mg by mouth 4 (four) times daily as needed for diarrhea or loose stools.   magnesium  oxide 400 MG tablet Commonly known as: MAG-OX Take 1 tablet (400 mg total) by mouth daily.   Melatonin 10 MG Tabs Take 10 mg by mouth at bedtime as needed (sleep).   metoprolol  tartrate 50 MG tablet Commonly known as: LOPRESSOR  TAKE 1 TABLET BY MOUTH TWICE DAILY   mirabegron  ER 50 MG Tb24 tablet Commonly known as: Myrbetriq  Take 1 tablet (50 mg total) by mouth daily. What changed: See the new instructions. Changed by: Hilary Pundt C Pardeep Pautz   MULTIVITAMIN ADULT PO Take 1 tablet by mouth daily.   nystatin  cream Commonly known as: MYCOSTATIN  Apply 1 Application topically 3 (three) times daily. To rashes under bilateral breasts and bilateral groin   nystatin  powder Commonly known as: MYCOSTATIN /NYSTOP  Apply 1 Application topically 3 (three) times daily.   omega-3 acid ethyl esters 1 g capsule Commonly known as: LOVAZA  TAKE ONE  CAPSULE BY MOUTH EVERY DAY   ondansetron  4 MG tablet Commonly known as: Zofran  Take 1 tablet (4 mg total) by mouth every 8 (eight) hours as needed for nausea or vomiting.   oxyCODONE -acetaminophen  10-325 MG tablet Commonly known as: Percocet Take 1 tablet by mouth every 4 (four) hours as needed for pain.   pantoprazole  40 MG tablet Commonly known as: PROTONIX  Take 1 tablet (40 mg total) by mouth daily. What changed:  when to take this reasons to take this   potassium chloride  10 MEQ CR capsule Commonly known as: MICRO-K  Take 2 capsules (20 mEq total) by mouth 2 (two) times daily. When Taking the Furosemide .   promethazine  12.5 MG tablet Commonly known as: PHENERGAN  as needed.   Semaglutide  (2 MG/DOSE) 8 MG/3ML Sopn Inject 2 mg as directed once a week.   sodium chloride  0.65 % nasal spray Commonly known as: OCEAN Place 2 sprays into the nose as needed for congestion.   tiotropium 18 MCG inhalation capsule Commonly known as: SPIRIVA  Place 18 mcg into inhaler and inhale as needed.   tiZANidine  2 MG tablet Commonly known as: ZANAFLEX  TAKE 1 TABLET(2 MG) BY MOUTH THREE TIMES DAILY   Toujeo  SoloStar 300 UNIT/ML Solostar Pen Generic drug: insulin  glargine (1 Unit Dial) 36 units QAM and 36 units  QPM   triamterene -hydrochlorothiazide  37.5-25 MG tablet Commonly known as: MAXZIDE -25 TAKE 1 TABLET BY MOUTH DAILY   venlafaxine  XR 75 MG 24 hr capsule Commonly known as: EFFEXOR -XR TAKE 1 CAPSULE BY MOUTH EVERY DAY WITH BREAKFAST   Vitamin D  (Ergocalciferol ) 1.25 MG (50000 UNIT) Caps capsule Commonly known as: DRISDOL  Take 1 capsule (50,000 Units total) by mouth every 7 (seven) days.   Xeljanz  5 MG Tabs Generic drug: Tofacitinib  Citrate TAKE 1 TABLET BY MOUTH DAILY        Review of Systems  Constitutional:  Negative for appetite change, chills, fatigue, fever and unexpected weight change.  HENT:  Negative for congestion, dental problem,  ear discharge, ear pain,  facial swelling, hearing loss, nosebleeds, postnasal drip, rhinorrhea, sinus pressure, sinus pain, sneezing, sore throat, tinnitus and trouble swallowing.   Eyes:  Negative for pain, discharge, redness, itching and visual disturbance.  Respiratory:  Negative for cough, chest tightness, shortness of breath and wheezing.   Cardiovascular:  Positive for leg swelling. Negative for chest pain and palpitations.  Gastrointestinal:  Negative for abdominal distention, abdominal pain, blood in stool, constipation, diarrhea, nausea and vomiting.  Endocrine: Negative for cold intolerance, heat intolerance, polydipsia, polyphagia and polyuria.  Genitourinary:  Positive for dysuria and frequency. Negative for difficulty urinating, flank pain and urgency.  Musculoskeletal:  Positive for back pain and gait problem. Negative for arthralgias, joint swelling, myalgias, neck pain and neck stiffness.  Skin:  Negative for color change, pallor, rash and wound.  Neurological:  Negative for dizziness, syncope, speech difficulty, weakness, light-headedness, numbness and headaches.  Psychiatric/Behavioral:  Negative for agitation, behavioral problems, confusion, hallucinations and sleep disturbance. The patient is not nervous/anxious.     Immunization History  Administered Date(s) Administered   Influenza-Unspecified 06/10/2011   PFIZER(Purple Top)SARS-COV-2 Vaccination 11/13/2019, 11/30/2019   Pneumococcal Conjugate-13 07/07/2016   Pneumococcal Polysaccharide-23 11/21/2003, 10/05/2017   Tdap 05/28/2016   Pertinent  Health Maintenance Due  Topic Date Due   FOOT EXAM  03/07/2024   HEMOGLOBIN A1C  04/02/2024   OPHTHALMOLOGY EXAM  06/11/2024   DEXA SCAN  09/20/2025   INFLUENZA VACCINE  Discontinued      08/11/2023    9:27 AM 09/12/2023   10:25 AM 11/07/2023   10:47 AM 12/28/2023   10:47 AM 03/01/2024    8:56 AM  Fall Risk  Falls in the past year? 0 0 0 0 0  Was there an injury with Fall? 0    0  Fall Risk  Category Calculator 0    0  Patient at Risk for Falls Due to    Impaired balance/gait    Functional Status Survey:    Vitals:   03/20/24 1039 03/20/24 1044  BP: (!) 118/50 (!) 120/58  Pulse: 62   Temp: 97.9 F (36.6 C)   SpO2: 95%   Weight: 224 lb 3.2 oz (101.7 kg)   Height: 5' 6 (1.676 m)    Body mass index is 36.19 kg/m. Physical Exam GENERAL: Alert, cooperative, well developed, no acute distress. HEENT: Normocephalic, normal oropharynx, moist mucous membranes. CHEST: Clear to auscultation bilaterally, no wheezes, rhonchi, or crackles. CARDIOVASCULAR: Normal heart rate and rhythm, S1 and S2 normal without murmurs. ABDOMEN: Soft, non-tender, non-distended, without organomegaly, normal bowel sounds. EXTREMITIES: No cyanosis or edema. NEUROLOGICAL: Cranial nerves grossly intact, moves all extremities without gross motor or sensory deficit.  SKIN: No rash,no lesion or bilateral groin area beefy erythema   PSYCHIATRY/BEHAVIORAL: Mood stable    Labs reviewed: Recent Labs    06/27/23 0901 11/09/23 1530  NA 144 139  K 3.9 4.5  CL 104 102  CO2 32 28  GLUCOSE 46* 126*  BUN 12 23  CREATININE 0.76 1.17*  CALCIUM  9.4 9.2   Recent Labs    06/27/23 0901 11/09/23 1530  AST 30 25  ALT 32* 19  BILITOT 0.3 0.4  PROT 6.3 6.8   Recent Labs    06/27/23 0901 11/09/23 1530  WBC 14.6* 14.5*  NEUTROABS 10,921* 10,962*  HGB 14.0 14.0  HCT 44.3 43.4  MCV 86.9 85.4  PLT 331 331   Lab Results  Component Value Date   TSH 2.65 10/04/2023   Lab  Results  Component Value Date   HGBA1C 6.2 (A) 10/04/2023   Lab Results  Component Value Date   CHOL 147 11/09/2023   HDL 40 (L) 11/09/2023   LDLCALC 77 11/09/2023   TRIG 206 (H) 11/09/2023   CHOLHDL 3.7 11/09/2023    Significant Diagnostic Results in last 30 days:  No results found.  Assessment/Plan  Urinary tract infection Recurrent urinary tract infection with symptoms of pain, frequency, and cloudy urine. Urinalysis  shows cloudy urine, blood, white blood cells, protein, and positive nitrites indicating an infection. Differential includes possible kidney involvement due to back pain. - Start ciprofloxacin  500 mg orally twice daily for 7 days - Perform urine culture to confirm bacterial sensitivity - Encourage increased water intake to help flush the urinary system  Overactive bladder with urinary incontinence Chronic overactive bladder with urinary incontinence for five years. Symptoms include frequent urination and urgency. Previously managed with mirabegron  (Myrbetriq ), which was stopped by her. - Restart mirabegron  (Myrbetriq ) for overactive bladder  Edema Generalized edema with significant swelling in the legs, partially reduced with current treatment. She has been using furosemide  and triamterene  with hydrochlorothiazide . Compression stockings not used due to swelling. - Continue furosemide  20 mg orally twice daily for 3 days as needed for weight gain - Encourage use of compression stockings once swelling reduces - Advise leg elevation when sitting  Type 2 diabetes mellitus without complications Suboptimal blood sugar control. She uses a continuous glucose monitor (G7) and is aware of the need for better control.  Chronic obstructive pulmonary disease (COPD) COPD with recent congestion and head fullness, possibly related to a cold. No fever reported.  Intertrigo Intertrigo under the breasts and in the groin area, previously managed with nystatin  powder. Symptoms have improved but recur with other health issues. - Provide prescription for nystatin  powder for use under the breasts and in the groin area    Family/ staff Communication: Reviewed plan of care with patient verbalized understanding   Labs/tests ordered:  - Urine Culture; Future - POC Urinalysis Dipstick   Next Appointment : Return for Annual wellness visit soon .   Spent 30 minutes of Face to face and non-face to face with  patient  >50% time spent counseling; reviewing medical record; tests; labs; documentation and developing future plan of care.   Roxan JAYSON Plough, NP

## 2024-03-23 LAB — URINE CULTURE
MICRO NUMBER:: 16766839
SPECIMEN QUALITY:: ADEQUATE

## 2024-03-25 ENCOUNTER — Ambulatory Visit: Payer: Self-pay | Admitting: Nurse Practitioner

## 2024-03-27 NOTE — Progress Notes (Unsigned)
 Subjective:    Patient ID: Katherine Delgado, female    DOB: [birth year], 83 y.o.   MRN: [id number]  HPI: Katherine Delgado is a 83 y.o. female who returns for follow up appointment for chronic pain and medication refill. She states her  pain is located in her neck radiating into her bilateral shoulders, L>R, lower back pain radiating into her bilateral lower extremities. She also reports pain in her bilateral hips, bilateral knees, generalized joint pain and bilateral hands and bilateral feet with tingling and burning. She rates her pain 10. Her current exercise regime is walking short distances with her walker  and performing stretching exercises.  [name] reports she was seen by her PCP and treated for UTI, reports lower extremities with edema and redness. Redness noted and edema, she is afebrile. Spoke with her son, he will call her PCP regarding the above, ? Cellulitis. He verbalizes understanding.   [name] Morphine  equivalent is 90.00 MME.   Last OralSwab was Performed on 12/28/2023, it was consistent.      Pain Inventory Average Pain 10 Pain Right Now 10 My pain is constant, sharp, burning, dull, stabbing, tingling, and aching  In the last 24 hours, has pain interfered with the following? General activity 10 Relation with others 9 Enjoyment of life 10 What TIME of day is your pain at its worst? morning , evening, and night Sleep (in general) Poor  Pain is worse with: walking, bending, sitting, inactivity, standing, and some activites Pain improves with: rest and medication Relief from Meds: 2  Family History  Problem Relation Age of Onset   Alzheimer's disease Mother    Heart disease Mother    Heart disease Father    Liver disease Father    Cancer Brother    Colon polyps Brother    Arthritis Son    Colon cancer Neg Hx    Esophageal cancer Neg Hx    Kidney disease Neg Hx    Stomach cancer Neg Hx    Rectal cancer Neg Hx    Social History   Socioeconomic History    Marital status: Widowed    Spouse name: Not on file   Number of children: 2   Years of education: 14   Highest education level: Not on file  Occupational History   Occupation: Retired  Tobacco Use   Smoking status: Former    Current packs/day: 0.00    Average packs/day: 0.1 packs/day for 10.0 years (1.0 ttl pk-yrs)    Types: Cigarettes    Start date: 12/06/1969    Quit date: 12/07/1979    Years since quitting: 44.3    Passive exposure: Current   Smokeless tobacco: Never  Vaping Use   Vaping status: Never Used  Substance and Sexual Activity   Alcohol  use: No    Alcohol /week: 0.0 standard drinks of alcohol    Drug use: No   Sexual activity: Not Currently  Other Topics Concern   Not on file  Social History Narrative   Walks with cane   Right handed    Caffeine use: Coffee (2 cups every morning)   Tea: sometimes   Soda: none   Social Drivers of Corporate investment banker Strain: Medium Risk (10/05/2017)   Overall Financial Resource Strain (CARDIA)    Difficulty of Paying Living Expenses: Somewhat hard  Food Insecurity: No Food Insecurity (10/25/2022)   Hunger Vital Sign    Worried About Running Out of Food in the Last Year: Never true  Ran Out of Food in the Last Year: Never true  Transportation Needs: No Transportation Needs (10/25/2022)   PRAPARE - Administrator, Civil Service (Medical): No    Lack of Transportation (Non-Medical): No  Physical Activity: Inactive (10/05/2017)   Exercise Vital Sign    Days of Exercise per Week: 0 days    Minutes of Exercise per Session: 0 min  Stress: Stress Concern Present (10/05/2017)   [employer] of Occupational Health - Occupational Stress Questionnaire    Feeling of Stress : Rather much  Social Connections: Moderately Isolated (10/05/2017)   Social Connection and Isolation Panel    Frequency of Communication with Friends and Family: More than three times a week    Frequency of Social Gatherings with Friends and  Family: More than three times a week    Attends Religious Services: Never    Database administrator or Organizations: No    Attends Banker Meetings: Never    Marital Status: Widowed   Past Surgical History:  Procedure Laterality Date   ABDOMINAL HYSTERECTOMY  1974   abdominal tumor  2002   APPENDECTOMY     APPLICATION OF A-CELL OF BACK N/A 09/26/2018   Procedure: With Acell;  Surgeon: Lowery Estefana RAMAN, DO;  Location: [facility];  Service: Plastics;  Laterality: N/A;   APPLICATION OF WOUND VAC N/A 09/26/2018   Procedure: Vac placement;  Surgeon: Lowery Estefana RAMAN, DO;  Location: [facility];  Service: Plastics;  Laterality: N/A;   BACK SURGERY  1982   BRONCHOSCOPY  2001   CATARACT EXTRACTION, BILATERAL  09/2019, 10/2019   CHOLECYSTECTOMY  1984   INCISION AND DRAINAGE OF WOUND N/A 09/26/2018   Procedure: Debridement of spine wound;  Surgeon: Lowery Estefana RAMAN, DO;  Location: [facility];  Service: Plastics;  Laterality: N/A;   KNEE SURGERY Bilateral 08/27/2010 (L) and 01/11/2011 (R)   LUMBAR LAMINECTOMY/DECOMPRESSION MICRODISCECTOMY N/A 07/03/2018   Procedure: Lumbar Three to Lumbar Five Laminectomy;  Surgeon: Cheryle Debby LABOR, MD;  Location: [facility];  Service: Neurosurgery;  Laterality: N/A;   LUMBAR WOUND DEBRIDEMENT N/A 08/20/2018   Procedure: POSTERIOR LUMBAR SPINAL WOUND DEBRIDEMENT AND REVISION;  Surgeon: Cheryle Debby LABOR, MD;  Location: [facility];  Service: Neurosurgery;  Laterality: N/A;  POSTERIOR LUMBAR SPINAL WOUND REVISION   miscarrage  1962   OVARIAN CYST SURGERY  1968   thymus tumor     thymus tumor  10/2000   TONSILLECTOMY     Past Surgical History:  Procedure Laterality Date   ABDOMINAL HYSTERECTOMY  1974   abdominal tumor  2002   APPENDECTOMY     APPLICATION OF A-CELL OF BACK N/A 09/26/2018   Procedure: With Acell;  Surgeon: Lowery Estefana RAMAN, DO;  Location: [facility];  Service: Plastics;  Laterality: N/A;   APPLICATION OF WOUND VAC N/A 09/26/2018   Procedure: Vac  placement;  Surgeon: Lowery Estefana RAMAN, DO;  Location: [facility];  Service: Plastics;  Laterality: N/A;   BACK SURGERY  1982   BRONCHOSCOPY  2001   CATARACT EXTRACTION, BILATERAL  09/2019, 10/2019   CHOLECYSTECTOMY  1984   INCISION AND DRAINAGE OF WOUND N/A 09/26/2018   Procedure: Debridement of spine wound;  Surgeon: Lowery Estefana RAMAN, DO;  Location: [facility];  Service: Plastics;  Laterality: N/A;   KNEE SURGERY Bilateral 08/27/2010 (L) and 01/11/2011 (R)   LUMBAR LAMINECTOMY/DECOMPRESSION MICRODISCECTOMY N/A 07/03/2018   Procedure: Lumbar Three to Lumbar Five Laminectomy;  Surgeon: Cheryle Debby LABOR, MD;  Location:  [facility];  Service: Neurosurgery;  Laterality: N/A;   LUMBAR WOUND DEBRIDEMENT N/A 08/20/2018   Procedure: POSTERIOR LUMBAR SPINAL WOUND DEBRIDEMENT AND REVISION;  Surgeon: Cheryle Debby LABOR, MD;  Location: [facility];  Service: Neurosurgery;  Laterality: N/A;  POSTERIOR LUMBAR SPINAL WOUND REVISION   miscarrage  1962   OVARIAN CYST SURGERY  1968   thymus tumor     thymus tumor  10/2000   TONSILLECTOMY     Past Medical History:  Diagnosis Date   Abnormality of gait 04/19/2016   Acute infective polyneuritis (HCC) 2002   Allergic rhinitis due to pollen    Chronic pain syndrome    COPD (chronic obstructive pulmonary disease) (HCC)    chronic bronchitis   Depressive disorder, not elsewhere classified    Diabetes mellitus without complication (HCC)    Diaphragmatic hernia without mention of obstruction or gangrene    Dyslipidemia    Fibromyalgia    GERD (gastroesophageal reflux disease)    with h/o esophagitis   Guillain-Barre syndrome (HCC)    History of benign thymus tumor    Insomnia, unspecified    Lumbar spinal stenosis 03/30/2018   L4-5 level, severe   Miscarriage 1962   Mixed hyperlipidemia    Morbid obesity (HCC)    Osteoporosis    Pneumonia    Polyneuropathy in diabetes(357.2)    Rheumatoid arthritis(714.0)    Spondylosis, lumbosacral    Spontaneous ecchymoses     Type II or unspecified type diabetes mellitus with peripheral circulatory disorders, uncontrolled(250.72)    Unspecified chronic bronchitis (HCC)    Unspecified essential hypertension    Unspecified hypothyroidism    Unspecified pruritic disorder    Unspecified urinary incontinence    There were no vitals taken for this visit.  Opioid Risk Score:   Fall Risk Score:  `1  Depression screen [facility] 2/9     03/01/2024    8:57 AM 12/28/2023   10:47 AM 09/12/2023   10:25 AM 08/11/2023    9:27 AM 07/12/2023   10:59 AM 06/09/2023    9:28 AM 06/09/2023    9:23 AM  Depression screen PHQ 2/9  Decreased Interest 0 0 0 0 0 0 0  Down, Depressed, Hopeless 0 0 0 0 1 0 0  PHQ - 2 Score 0 0 0 0 1 0 0    Review of Systems  Musculoskeletal:  Positive for back pain, gait problem and neck pain.       Pain all over the body, pain in both knees, both legs, arms,hands, shoulders  All other systems reviewed and are negative.      Objective:   Physical Exam Vitals and nursing note reviewed.  Constitutional:      Appearance: Normal appearance.  Neck:     Comments: Cervical Paraspinal Tenderness C-5- C-6 Cardiovascular:     Rate and Rhythm: Normal rate and regular rhythm.     Pulses: Normal pulses.     Heart sounds: Normal heart sounds.  Pulmonary:     Effort: Pulmonary effort is normal.     Breath sounds: Normal breath sounds.  Musculoskeletal:     Right lower leg: Edema present.     Left lower leg: Edema present.     Comments: Normal Muscle Bulk and Muscle Testing Reveals:  Upper Extremities: Decreased ROM 45 Degrees  and Muscle Strength 5/5 Bilateral AC Joint Tenderness: L>R  Lumbar Paraspinal Tenderness: L-4-L-5 Bilateral Greater Trochanter Tenderness Lower Extremities : Full ROM and Muscle Strength 5/5 Arrived in wheelchair  Skin:    General: Skin is warm and dry.     Comments: Bilateral Lower Extremities with redness  Neurological:     Mental Status: She is alert and oriented to  person, place, and time.  Psychiatric:        Mood and Affect: Mood normal.        Behavior: Behavior normal.          Assessment & Plan:  1.Failed Back Syndrome: Continue HEP as Tolerated. Continue to Monitor. 03/28/2024 2.Lumbar Radiculitis/ Chronic Low Back Pain without Sciatica: Continue Gabapentin . Continue to monitor. Continue HEP as Tolerated 03/28/2024 3.Bilateral  Greater Trochanter Bursitis: .Continue to Alternate Ice and Heat Therapy. Continue current medication regimen. Continue to monitor. 03/28/2024 4.Bilateral Primary OA:/ Both Knees  Continue HEP as Tolerated. Continue to Monitor. 03/28/2024 5.Insomnia: Continue Amitriptyline  . Continue to Monitor.03/28/2024 6.Chronic Pain Syndrome: Refilled: Oxycodone  10/325 mg one tablet every 4 hours as needed for pain #180. We will continue the opioid monitoring program, this consists of regular clinic visits, examinations, urine drug screen, pill counts as well as use of [place]  Controlled Substance Reporting system. A 12 month History has been reviewed on the [place]  Controlled Substance Reporting System on 03/28/2024 7. Cervicalgia/ Cervical Radiculitis: Continue Gabapentin . Continue HEP as tolerated. Continue to monitor. 08/07//2025 8. Polyarthralgia: Continue HEP as tolerated. Continue to monitor. 03/28/2024 9. Polyneuropathy: Continue Gabapentin . Continue to Monitor. 03/28/2024 10. ? Cellulitis: Bilateral Lower Extremities:  Ms. Dorian son will call her PCP today, to be evaluated, he will update this provider. Regarding the above.  F/U in 1 month

## 2024-03-28 ENCOUNTER — Encounter: Payer: Self-pay | Admitting: Registered Nurse

## 2024-03-28 ENCOUNTER — Encounter: Attending: Physical Medicine and Rehabilitation | Admitting: Registered Nurse

## 2024-03-28 VITALS — BP 123/67 | HR 65 | Ht 66.0 in | Wt 226.0 lb

## 2024-03-28 DIAGNOSIS — M25511 Pain in right shoulder: Secondary | ICD-10-CM | POA: Diagnosis not present

## 2024-03-28 DIAGNOSIS — M542 Cervicalgia: Secondary | ICD-10-CM | POA: Insufficient documentation

## 2024-03-28 DIAGNOSIS — G894 Chronic pain syndrome: Secondary | ICD-10-CM | POA: Insufficient documentation

## 2024-03-28 DIAGNOSIS — M7062 Trochanteric bursitis, left hip: Secondary | ICD-10-CM | POA: Diagnosis not present

## 2024-03-28 DIAGNOSIS — M25512 Pain in left shoulder: Secondary | ICD-10-CM | POA: Diagnosis not present

## 2024-03-28 DIAGNOSIS — Z79891 Long term (current) use of opiate analgesic: Secondary | ICD-10-CM | POA: Diagnosis not present

## 2024-03-28 DIAGNOSIS — M961 Postlaminectomy syndrome, not elsewhere classified: Secondary | ICD-10-CM | POA: Diagnosis not present

## 2024-03-28 DIAGNOSIS — M2550 Pain in unspecified joint: Secondary | ICD-10-CM | POA: Diagnosis not present

## 2024-03-28 DIAGNOSIS — M5412 Radiculopathy, cervical region: Secondary | ICD-10-CM | POA: Insufficient documentation

## 2024-03-28 DIAGNOSIS — G8929 Other chronic pain: Secondary | ICD-10-CM | POA: Insufficient documentation

## 2024-03-28 DIAGNOSIS — M170 Bilateral primary osteoarthritis of knee: Secondary | ICD-10-CM | POA: Insufficient documentation

## 2024-03-28 DIAGNOSIS — M7061 Trochanteric bursitis, right hip: Secondary | ICD-10-CM | POA: Insufficient documentation

## 2024-03-28 DIAGNOSIS — G6289 Other specified polyneuropathies: Secondary | ICD-10-CM | POA: Diagnosis not present

## 2024-03-28 DIAGNOSIS — Z5181 Encounter for therapeutic drug level monitoring: Secondary | ICD-10-CM | POA: Insufficient documentation

## 2024-03-28 DIAGNOSIS — M5416 Radiculopathy, lumbar region: Secondary | ICD-10-CM | POA: Insufficient documentation

## 2024-03-28 MED ORDER — OXYCODONE-ACETAMINOPHEN 10-325 MG PO TABS: 1.0000 | ORAL_TABLET | ORAL | 0 refills | Status: DC | PRN

## 2024-03-28 NOTE — Progress Notes (Unsigned)
 Office Visit Note  Patient: Katherine Delgado             Date of Birth: 21-Jun-1941           MRN: [id number]             PCP: Leonarda Roxan BROCKS, NP Referring: Leonarda Roxan BROCKS, NP Visit Date: 04/11/2024 Occupation: @GUAROCC @  Subjective:  Pain in multiple joints   History of Present Illness: Katherine Delgado is a 83 y.o. female with history of rheumatoid arthritis and Sjogren syndrome.  Patient is taking Xeljanz  5 mg 1 tablet by mouth daily.  She is tolerating Xeljanz  without any side effects and has not had any gaps in therapy.  Patient continues to have chronic pain involving multiple joints.  Her symptoms have been most severe in the left shoulder, both hands, both hips, both knees.  Patient states that she has had increased inflammation in both hands progressively worsening since April.  Patient has been taking Percocet as prescribed by pain management but states that she has had to take more Tylenol  for breakthrough symptoms.  She has been experiencing nocturnal pain as well as stiffness lasting all day.    Activities of Daily Living:  Patient reports joint stiffness all day  Patient Reports nocturnal pain.  Difficulty dressing/grooming: Reports Difficulty climbing stairs: Reports Difficulty getting out of chair: Reports Difficulty using hands for taps, buttons, cutlery, and/or writing: Reports  Review of Systems  Constitutional:  Positive for fatigue.  HENT:  Positive for mouth dryness. Negative for mouth sores.   Eyes:  Positive for dryness.  Respiratory:  Positive for shortness of breath.   Cardiovascular:  Positive for chest pain and palpitations.  Gastrointestinal:  Positive for constipation and diarrhea. Negative for blood in stool.  Endocrine: Positive for increased urination.  Genitourinary:  Positive for involuntary urination.  Musculoskeletal:  Positive for joint pain, gait problem, joint pain, joint swelling, myalgias, muscle weakness, morning stiffness, muscle tenderness  and myalgias.  Skin:  Positive for rash, hair loss and sensitivity to sunlight. Negative for color change.  Allergic/Immunologic: Positive for susceptible to infections.  Neurological:  Negative for dizziness and headaches.  Hematological:  Negative for swollen glands.  Psychiatric/Behavioral:  Positive for sleep disturbance. Negative for depressed mood. The patient is not nervous/anxious.     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   Primary osteoarthritis of right knee 07/25/2019   Wound dehiscence 08/17/2018   Lumbar spinal stenosis 03/30/2018   Rheumatoid arthritis involving both wrists with positive rheumatoid factor (HCC) 06/14/2016   High risk medication use 06/14/2016   Sjogren's syndrome (HCC) 06/14/2016   Primary osteoarthritis of both knees 06/14/2016   DDD (degenerative disc disease), lumbar 06/14/2016   Hypothyroidism 06/14/2016   Osteoporosis 06/14/2016   Abnormality of gait 04/19/2016   Type 2 diabetes mellitus with diabetic polyneuropathy, with long-term current use of insulin  (HCC) 11/27/2015   Diabetes mellitus without complication (HCC) 09/12/2013   Headache 07/31/2013   Sinus infection 07/31/2013   Chronic right-sided low back pain with right-sided sciatica 03/14/2013   Insomnia 12/06/2012   Nausea alone 12/06/2012   Diarrhea 12/06/2012   Urinary incontinence, urge 12/06/2012  Lumbosacral root lesions, not elsewhere classified 11/27/2012   Diabetic polyneuropathy associated with type 2 diabetes mellitus (HCC) 11/27/2012   EDEMA 05/04/2010   YEAST INFECTION 05/03/2010   Hyperlipidemia 05/03/2010   Major depression, recurrent (HCC)  05/03/2010   History of peripheral neuropathy 05/03/2010   Essential hypertension 05/03/2010   ALLERGIC RHINITIS 05/03/2010   PNEUMONIA 05/03/2010   COPD (chronic obstructive pulmonary disease) (HCC) 05/03/2010   GERD 05/03/2010   Fibromyalgia 05/03/2010   DYSPNEA 05/03/2010   CHEST PAIN 05/03/2010    Past Medical History:  Diagnosis Date   Abnormality of gait 04/19/2016   Acute infective polyneuritis (HCC) 2002   Allergic rhinitis due to pollen    Chronic pain syndrome    COPD (chronic obstructive pulmonary disease) (HCC)    chronic bronchitis   Cystitis    Depressive disorder, not elsewhere classified    Diabetes mellitus without complication (HCC)    Diaphragmatic hernia without mention of obstruction or gangrene    Dyslipidemia    Fibromyalgia    GERD (gastroesophageal reflux disease)    with h/o esophagitis   Guillain-Barre syndrome (HCC)    History of benign thymus tumor    Insomnia, unspecified    Lumbar spinal stenosis 03/30/2018   L4-5 level, severe   Miscarriage 1962   Mixed hyperlipidemia    Morbid obesity (HCC)    Osteoporosis    Pneumonia    Polyneuropathy in diabetes(357.2)    Rheumatoid arthritis(714.0)    Spondylosis, lumbosacral    Spontaneous ecchymoses    Type II or unspecified type diabetes mellitus with peripheral circulatory disorders, uncontrolled(250.72)    Unspecified chronic bronchitis (HCC)    Unspecified essential hypertension    Unspecified hypothyroidism    Unspecified pruritic disorder    Unspecified urinary incontinence     Family History  Problem Relation Age of Onset   Alzheimer's disease Mother    Heart disease Mother    Heart disease Father    Liver disease Father    Cancer Brother    Colon polyps Brother    Arthritis Son    Colon cancer Neg Hx    Esophageal cancer Neg Hx    Kidney disease Neg Hx    Stomach cancer Neg Hx    Rectal cancer Neg Hx    Past Surgical History:  Procedure Laterality Date   ABDOMINAL  HYSTERECTOMY  1974   abdominal tumor  2002   APPENDECTOMY     APPLICATION OF A-CELL OF BACK N/A 09/26/2018   Procedure: With Acell;  Surgeon: Lowery Estefana RAMAN, DO;  Location: [facility] OR;  Service: Plastics;  Laterality: N/A;   APPLICATION OF WOUND VAC N/A 09/26/2018   Procedure: Vac placement;  Surgeon: Lowery Estefana RAMAN, DO;  Location: [facility] OR;  Service: Plastics;  Laterality: N/A;   BACK SURGERY  1982   BRONCHOSCOPY  2001   CATARACT EXTRACTION, BILATERAL  09/2019, 10/2019   CHOLECYSTECTOMY  1984   INCISION AND DRAINAGE OF WOUND N/A 09/26/2018   Procedure: Debridement of spine wound;  Surgeon: Lowery Estefana RAMAN, DO;  Location: [facility] OR;  Service: Plastics;  Laterality: N/A;   KNEE SURGERY Bilateral 08/27/2010 (L) and 01/11/2011 (R)   LUMBAR LAMINECTOMY/DECOMPRESSION MICRODISCECTOMY N/A 07/03/2018   Procedure: Lumbar Three to Lumbar Five Laminectomy;  Surgeon: Cheryle Debby LABOR, MD;  Location: [facility] OR;  Service: Neurosurgery;  Laterality: N/A;   LUMBAR WOUND DEBRIDEMENT N/A 08/20/2018   Procedure: POSTERIOR LUMBAR SPINAL WOUND DEBRIDEMENT AND REVISION;  Surgeon: Cheryle Debby LABOR, MD;  Location: [facility] OR;  Service: Neurosurgery;  Laterality: N/A;  POSTERIOR LUMBAR SPINAL WOUND REVISION   miscarrage  1962   OVARIAN CYST SURGERY  1968   thymus tumor     thymus tumor  10/2000   TONSILLECTOMY     Social History   Social History Narrative   Walks with cane   Right handed    Caffeine use: Coffee (2 cups every morning)   Tea: sometimes   Soda: none   Immunization History  Administered Date(s) Administered   Influenza-Unspecified 06/10/2011   PFIZER(Purple Top)SARS-COV-2 Vaccination 11/13/2019, 11/30/2019   Pneumococcal Conjugate-13 07/07/2016   Pneumococcal Polysaccharide-23 11/21/2003, 10/05/2017   Tdap 05/28/2016     Objective: Vital Signs: BP (!) 154/73 (BP Location: Left Arm, Patient Position: Sitting, Cuff Size: Normal)   Pulse 64   Resp 16   Ht 5' 6 (1.676 m)   BMI 37.80 kg/m     Physical Exam Vitals and nursing note reviewed.  Constitutional:      Appearance: She is well-developed.  HENT:     Head: Normocephalic and atraumatic.  Eyes:     Conjunctiva/sclera: Conjunctivae normal.  Cardiovascular:     Rate and Rhythm: Normal rate and regular rhythm.     Heart sounds: Normal heart sounds.  Pulmonary:     Effort: Pulmonary effort is normal.     Breath sounds: Normal breath sounds.  Abdominal:     General: Bowel sounds are normal.     Palpations: Abdomen is soft.  Musculoskeletal:     Cervical back: Normal range of motion.  Lymphadenopathy:     Cervical: No cervical adenopathy.  Skin:    General: Skin is warm and dry.     Capillary Refill: Capillary refill takes less than 2 seconds.  Neurological:     Mental Status: She is alert and oriented to person, place, and time.  Psychiatric:        Behavior: Behavior normal.      Musculoskeletal Exam: Generalized hyperalgesia and positive tender points noted.  Painful limited range of motion of the cervical spine.  Thoracic kyphosis noted.  Severely limited range of motion of both shoulders with tenderness of the left shoulder.  No tenderness or swelling along the elbow joint line.  Synovial thickening of both wrist joints but no active synovitis noted.  Tenderness of the left fourth PIP joint.  Synovial thickening of all MCP joints no synovitis noted.  Hip joints difficult to assess while seated in the wheelchair.  Painful range of motion of both knees but no effusion noted.  Pedal edema noted in bilateral lower extremities.  CDAI Exam: CDAI Score: -- Patient Global: --; Provider Global: -- Swollen: --; Tender: -- Joint Exam 04/11/2024   No joint exam has been documented for this visit   There is currently no information documented on the homunculus. Go to the Rheumatology activity and complete the homunculus joint exam.  Investigation: No additional findings.  Imaging: No results found.  Recent  Labs: Lab Results  Component Value Date   WBC 14.5 (H) 11/09/2023   HGB 14.0 11/09/2023   PLT 331 11/09/2023   NA 139 11/09/2023   K 4.5 11/09/2023   CL 102 11/09/2023   CO2 28 11/09/2023   GLUCOSE 126 (H) 11/09/2023   BUN 23 11/09/2023   CREATININE 1.17 (H) 11/09/2023   BILITOT 0.4 11/09/2023   ALKPHOS 96 01/25/2021   AST 25 11/09/2023   ALT 19 11/09/2023   PROT 6.8 11/09/2023   ALBUMIN  3.5 01/25/2021   CALCIUM  9.2 11/09/2023   GFRAA 65 02/12/2021   QFTBGOLDPLUS NEGATIVE 03/27/2023    Speciality Comments: No specialty comments available.  Procedures:  No procedures performed Allergies: Influenza vaccines, Penicillins, Statins, and Sulfamethoxazole -trimethoprim    Assessment / Plan:     Visit Diagnoses: Rheumatoid arthritis involving multiple sites with positive rheumatoid factor (HCC): She has no synovitis on examination today.  Patient continues to have chronic pain involving multiple joints.  Her symptoms have been most severe involving the left shoulder, both hands, both hips, and both knee joints.  She remains on Xeljanz  5 mg 1 tablet by mouth daily.  She is tolerating Xeljanz  without any side effects and has not had any recent gaps in therapy.  She does not find that Xeljanz  is as effective as it once was at controlling her pain levels.  She has no active inflammation on examination today but has been under the care of pain management.  She is been taking Percocet for pain relief and was encouraged to discuss treatment alternatives if her pain levels remain inadequately controlled.  Discussed the apprehension of increasing the dose of Xeljanz  or adding an immunosuppressive agent as combination therapy due to the patient's history of recurrent infections.  She recently recovered from a UTI.  Offered to schedule an ultrasound to assess for synovitis.  Also offered to schedule an ultrasound-guided left fourth PIP joint injection but she would like to hold off at this time since the  symptoms have started to subside. She remains under the care of pain management.   She will follow up in 5 months or sooner if needed.    High risk medication use - Xeljanz  5 mg 1 tablet by mouth daily. D/c Arava -SE of diarrhea, d/c MTX due to recurent infections. CBC and CMP were drawn on 11/09/2023.  Orders for CBC and CMP were released today. Lipid panel obtained on 11/09/2023. TB Gold negative on 03/27/2023. Order for TB gold released today.  Recent UTI. History of recurrent infections.  Discussed the importance of holding Xeljanz  if she develops signs or symptoms of an infection and to resume once the infection is completely cleared.   - Plan: CBC with Differential/Platelet, Comprehensive metabolic panel with GFR, QuantiFERON-TB Gold Plus  Sjogren's syndrome with keratoconjunctivitis sicca (HCC) - She continues to have chronic sicca symptoms.  Overall her symptoms have been manageable. Discussed the increased risk of developing lymphoma in patients with Sjogren syndrome.  Plan to obtain the following lab work today for further evaluation.  She will notify us  if she develops any new or worsening symptoms.  She will follow-up in the office in 5 months or sooner if needed.- Plan: Sjogrens syndrome-A extractable nuclear antibody, Sjogrens syndrome-B extractable nuclear antibody, Serum protein electrophoresis with reflex, ANA, C3 and C4  Primary osteoarthritis of both knees: Chronic pain.  Primarily wheelchair-bound.  No warmth or effusion noted.  Under the care of pain management.  Degeneration of intervertebral disc of lumbar region without discogenic back pain or lower extremity pain: Under the care of pain management.  Age-related osteoporosis without current pathological fracture - Followed by her PCP.  She is taking vitamin D  supplement daily.  Fibromyalgia: She has generalized hyperalgesia and positive tender points on exam.  Under the care of pain management.  She is taking Percocet as needed  and remains on Cymbalta  and Elavil  as prescribed.  Other insomnia - She has been having difficulty sleeping at night due to nocturnal pain.  Other medical conditions are  listed as follows:  History of diabetes mellitus  History of peripheral neuropathy  History of COPD  History of hypertension: Blood pressure was elevated today in the office and was rechecked prior to leaving.  Patient was advised to monitor blood pressure closely and return to PCP if her blood pressure mains elevated.  History of Guillain-Barre syndrome  Herpes zoster without complication  History of gastroesophageal reflux (GERD)  History of depression  Dyslipidemia: Lipid panel obtained on 11/09/2023.  Orders: Orders Placed This Encounter  Procedures   CBC with Differential/Platelet   Comprehensive metabolic panel with GFR   Sjogrens syndrome-A extractable nuclear antibody   Sjogrens syndrome-B extractable nuclear antibody   Serum protein electrophoresis with reflex   ANA   C3 and C4   QuantiFERON-TB Gold Plus   No orders of the defined types were placed in this encounter.    Follow-Up Instructions: Return in about 5 months (around 09/11/2024) for Rheumatoid arthritis.   Waddell CHRISTELLA Craze, PA-C  Note - This record has been created using Dragon software.  Chart creation errors have been sought, but may not always  have been located. Such creation errors do not reflect on  the standard of medical care.

## 2024-04-01 ENCOUNTER — Ambulatory Visit: Payer: Self-pay

## 2024-04-01 NOTE — Telephone Encounter (Signed)
 FYI Only or Action Required?: FYI only for provider.  Patient was last seen in primary care on 03/20/2024 by Ngetich, Roxan BROCKS, NP.  Called Nurse Triage reporting Leg Swelling.  Symptoms began several weeks ago.  Interventions attempted: Prescription medications: lasix  and Rest, hydration, or home remedies.  Symptoms are: gradually worsening.  Triage Disposition: See Physician Within 24 Hours  Patient/caregiver understands and will follow disposition?: Yes      Copied from CRM #[id number]. Topic: Clinical - Red Word Triage >> Apr 01, 2024  9:08 AM Zane F wrote: Red Word that prompted transfer to Nurse Triage:   Patient is having swelling, blistering, painful and redness in her legs and feet; another provider at the pain clinic informed her it may be cellulitis (about a week ago; last Monday)  Care: Patient has taken a full term of antibiotic and has seen no relief Reason for Disposition  [1] MODERATE leg swelling (e.g., swelling extends up to knees) AND [2] new-onset or getting worse  Answer Assessment - Initial Assessment Questions 1. ONSET: When did the swelling start? (e.g., minutes, hours, days)     About 2 weeks ago  2. LOCATION: What part of the leg is swollen?  Are both legs swollen or just one leg?     Both legs but right is worse 3. SEVERITY: How bad is the swelling? (e.g., localized; mild, moderate, severe)     mod 4. REDNESS: Is there redness or signs of infection?     Redness and blisters  5. PAIN: Is the swelling painful to touch? If Yes, ask: How painful is it?   (Scale 1-10; mild, moderate or severe)     7/10  6. FEVER: Do you have a fever? If Yes, ask: What is it, how was it measured, and when did it start?      no 7. CAUSE: What do you think is causing the leg swelling?     cellulitis 8. MEDICAL HISTORY: Do you have a history of blood clots (e.g., DVT), cancer, heart failure, kidney disease, or liver failure?     no 9. RECURRENT  SYMPTOM: Have you had leg swelling before? If Yes, ask: When was the last time? What happened that time?     Yes had swelling before but not the redness 10. OTHER SYMPTOMS: Do you have any other symptoms? (e.g., chest pain, difficulty breathing)       no  Protocols used: Leg Swelling and Edema-A-AH

## 2024-04-01 NOTE — Telephone Encounter (Signed)
 Pt scheduled for tomorrow but also has appt afterwards at 10:30 with endocrinology.

## 2024-04-01 NOTE — Telephone Encounter (Signed)
 Noted.will evaluate symptoms during the visit tomorrow.

## 2024-04-02 ENCOUNTER — Ambulatory Visit (INDEPENDENT_AMBULATORY_CARE_PROVIDER_SITE_OTHER): Admitting: Family

## 2024-04-02 ENCOUNTER — Ambulatory Visit: Payer: Medicare Other | Admitting: Internal Medicine

## 2024-04-02 ENCOUNTER — Encounter: Payer: Self-pay | Admitting: Family

## 2024-04-02 ENCOUNTER — Encounter: Payer: Self-pay | Admitting: Internal Medicine

## 2024-04-02 VITALS — BP 126/74 | HR 61 | Ht 66.0 in

## 2024-04-02 VITALS — BP 126/70 | HR 63 | Temp 97.8°F | Resp 18 | Wt 234.2 lb

## 2024-04-02 DIAGNOSIS — Z794 Long term (current) use of insulin: Secondary | ICD-10-CM

## 2024-04-02 DIAGNOSIS — L03031 Cellulitis of right toe: Secondary | ICD-10-CM | POA: Diagnosis not present

## 2024-04-02 DIAGNOSIS — E039 Hypothyroidism, unspecified: Secondary | ICD-10-CM | POA: Diagnosis not present

## 2024-04-02 DIAGNOSIS — E1142 Type 2 diabetes mellitus with diabetic polyneuropathy: Secondary | ICD-10-CM | POA: Diagnosis not present

## 2024-04-02 DIAGNOSIS — J441 Chronic obstructive pulmonary disease with (acute) exacerbation: Secondary | ICD-10-CM | POA: Diagnosis not present

## 2024-04-02 DIAGNOSIS — R600 Localized edema: Secondary | ICD-10-CM | POA: Diagnosis not present

## 2024-04-02 LAB — POCT GLYCOSYLATED HEMOGLOBIN (HGB A1C): Hemoglobin A1C: 7.1 % — AB (ref 4.0–5.6)

## 2024-04-02 LAB — TSH: TSH: 2.37 m[IU]/L (ref 0.40–4.50)

## 2024-04-02 MED ORDER — DOXYCYCLINE HYCLATE 100 MG PO TABS: 100.0000 mg | ORAL_TABLET | Freq: Two times a day (BID) | ORAL | 0 refills | Status: AC

## 2024-04-02 MED ORDER — FUROSEMIDE 40 MG PO TABS: 40.0000 mg | ORAL_TABLET | Freq: Every day | ORAL | 1 refills | Status: AC

## 2024-04-02 MED ORDER — FUROSEMIDE 40 MG PO TABS: ORAL_TABLET | ORAL | 1 refills | Status: DC

## 2024-04-02 MED ORDER — IPRATROPIUM-ALBUTEROL 0.5-2.5 (3) MG/3ML IN SOLN: 3.0000 mL | Freq: Four times a day (QID) | RESPIRATORY_TRACT | 1 refills | Status: AC | PRN

## 2024-04-02 MED ORDER — MAGNESIUM OXIDE 400 MG PO TABS: 400.0000 mg | ORAL_TABLET | Freq: Every day | ORAL | 1 refills | Status: DC

## 2024-04-02 MED ORDER — POTASSIUM CHLORIDE CRYS ER 20 MEQ PO TBCR: 40.0000 meq | EXTENDED_RELEASE_TABLET | Freq: Every day | ORAL | 3 refills | Status: AC

## 2024-04-02 NOTE — Progress Notes (Signed)
 Name: Katherine Delgado  Age/ Sex: 83 y.o., female   MRN/ DOB: [id number], [birth year]     PCP: Leonarda Roxan BROCKS, NP   Reason for Endocrinology Evaluation: Type 2 Diabetes Mellitus/ hypothyriodism  Initial Endocrine Consultative Visit: 10/14/2020    PATIENT IDENTIFIER: Katherine Delgado is a 83 y.o. female with a past medical history of HTN, DM, RA, failed back syndrome and Hypothyroidism.. The patient has followed with Endocrinology clinic since 10/14/2020 for consultative assistance with management of her diabetes.      DIABETIC HISTORY:  Katherine Delgado was diagnosed with DM many years ago. Her hemoglobin A1c has ranged from Her A1c was ranged from 6.6% in 2019 to 10.3 % in 2022.     On her initial visit to our clinic her A1c was 10.3% .We continued MDI regimen , victoza  and started Farxiga  and was provided with a correction scale . A dexcom prescription was sent   Farxiga  was discontinued due to severe genital irritation    Switch Victoza  to Ozempic  02/2022   THYROID  HISTORY: She has been on LT-4 replacement for years. Was noted to have a consistently low TSH since 05/2020. Per pt has been diagnosed with MNG in the past. No prior sx or biopsies. She was  On Levothyroxine  125 mcg daily which we reduced to 112 mcg daily   SUBJECTIVE:   During the last visit (10/04/2023): A1c 6.8 %   Today (04/02/2024): Katherine Delgado is here for a follow up on diabetes and hypothyroidism . She checks her blood sugars multiple times a day through CGM. The patient has had hypoglycemic episodes since the last clinic visit, she is symptomatic.    She continues to follow-up with rheumatology for rheumatoid arthritis  She has cold intolerance as well as fatigue  Has hair loss  Has noted LE edema , she had a follow-up with the PCP for this recently and was prescribed antibiotics Denies  vomiting  Continues with occasional constipation  HOME ENDOCRINE REGIMEN:  Toujeo  36 units BID Humalog  5 units with  each meal  Ozempic  2 mg weekly  CF: Humalog  ( BG- 140/35)  Levothyroxine  100 mcg daily     CONTINUOUS GLUCOSE MONITORING RECORD INTERPRETATION    Dates of Recording: 7/30-8/07/2024  Sensor description:dexcom  Results statistics:   CGM use % of time 88  Average and SD 190/63  Time in range 42%  % Time Above 180 40  % Time above 250 17  % Time Below target <1    Glycemic patterns summary: BGs fluctuate throughout the day and night  Hypoglycemic episodes occurred after bolus  Overnight periods: Variable   DIABETIC COMPLICATIONS: Microvascular complications:  neuropathy Denies: CKD, retinopathy Last Eye Exam: Completed 03/25/2021  Macrovascular complications:   Denies: CAD, CVA, PVD   HISTORY:  Past Medical History:  Past Medical History:  Diagnosis Date   Abnormality of gait 04/19/2016   Acute infective polyneuritis (HCC) 2002   Allergic rhinitis due to pollen    Chronic pain syndrome    COPD (chronic obstructive pulmonary disease) (HCC)    chronic bronchitis   Depressive disorder, not elsewhere classified    Diabetes mellitus without complication (HCC)    Diaphragmatic hernia without mention of obstruction or gangrene    Dyslipidemia    Fibromyalgia    GERD (gastroesophageal reflux disease)    with h/o esophagitis   Guillain-Barre syndrome (HCC)    History of benign thymus tumor    Insomnia, unspecified    Lumbar spinal  stenosis 03/30/2018   L4-5 level, severe   Miscarriage 1962   Mixed hyperlipidemia    Morbid obesity (HCC)    Osteoporosis    Pneumonia    Polyneuropathy in diabetes(357.2)    Rheumatoid arthritis(714.0)    Spondylosis, lumbosacral    Spontaneous ecchymoses    Type II or unspecified type diabetes mellitus with peripheral circulatory disorders, uncontrolled(250.72)    Unspecified chronic bronchitis (HCC)    Unspecified essential hypertension    Unspecified hypothyroidism    Unspecified pruritic disorder    Unspecified urinary  incontinence    Past Surgical History:  Past Surgical History:  Procedure Laterality Date   ABDOMINAL HYSTERECTOMY  1974   abdominal tumor  2002   APPENDECTOMY     APPLICATION OF A-CELL OF BACK N/A 09/26/2018   Procedure: With Acell;  Surgeon: Lowery Estefana RAMAN, DO;  Location: [facility] OR;  Service: Plastics;  Laterality: N/A;   APPLICATION OF WOUND VAC N/A 09/26/2018   Procedure: Vac placement;  Surgeon: Lowery Estefana RAMAN, DO;  Location: [facility] OR;  Service: Plastics;  Laterality: N/A;   BACK SURGERY  1982   BRONCHOSCOPY  2001   CATARACT EXTRACTION, BILATERAL  09/2019, 10/2019   CHOLECYSTECTOMY  1984   INCISION AND DRAINAGE OF WOUND N/A 09/26/2018   Procedure: Debridement of spine wound;  Surgeon: Lowery Estefana RAMAN, DO;  Location: [facility];  Service: Plastics;  Laterality: N/A;   KNEE SURGERY Bilateral 08/27/2010 (L) and 01/11/2011 (R)   LUMBAR LAMINECTOMY/DECOMPRESSION MICRODISCECTOMY N/A 07/03/2018   Procedure: Lumbar Three to Lumbar Five Laminectomy;  Surgeon: Cheryle Debby LABOR, MD;  Location: [facility] OR;  Service: Neurosurgery;  Laterality: N/A;   LUMBAR WOUND DEBRIDEMENT N/A 08/20/2018   Procedure: POSTERIOR LUMBAR SPINAL WOUND DEBRIDEMENT AND REVISION;  Surgeon: Cheryle Debby LABOR, MD;  Location: [facility];  Service: Neurosurgery;  Laterality: N/A;  POSTERIOR LUMBAR SPINAL WOUND REVISION   miscarrage  1962   OVARIAN CYST SURGERY  1968   thymus tumor     thymus tumor  10/2000   TONSILLECTOMY     Social History:  reports that she quit smoking about 44 years ago. Her smoking use included cigarettes. She started smoking about 54 years ago. She has a 1 pack-year smoking history. She has been exposed to tobacco smoke. She has never used smokeless tobacco. She reports that she does not drink alcohol  and does not use drugs. Family History:  Family History  Problem Relation Age of Onset   Alzheimer's disease Mother    Heart disease Mother    Heart disease Father    Liver disease Father    Cancer  Brother    Colon polyps Brother    Arthritis Son    Colon cancer Neg Hx    Esophageal cancer Neg Hx    Kidney disease Neg Hx    Stomach cancer Neg Hx    Rectal cancer Neg Hx      HOME MEDICATIONS: Allergies as of 04/02/2024       Reactions   Influenza Vaccines Other (See Comments)   h/o Guillain Barre; dr's told me years ago never to take another flu shot as it could relapse Julee Birch; has to do with when vaccine being changed to H1N1 virus   Penicillins Hives, Itching, Swelling, Rash   Has patient had a PCN reaction causing immediate rash, facial/tongue/throat swelling, SOB or lightheadedness with hypotension:Unknown Has patient had a PCN reaction causing severe rash involving mucus membranes or skin necrosis: Unknown Has patient had a  PCN reaction that required hospitalization:Unknown Has patient had a PCN reaction occurring within the last 10 years: Unknown If all of the above answers are NO, then may proceed with Cephalosporin use.   Statins Other (See Comments)   Myopathy, transaminitis   Sulfamethoxazole -trimethoprim  Itching        Medication List        Accurate as of April 02, 2024 10:40 AM. If you have any questions, ask your nurse or doctor.          STOP taking these medications    potassium chloride  10 MEQ CR capsule Commonly known as: MICRO-K  Stopped by: Dinah C Ngetich       TAKE these medications    amitriptyline  50 MG tablet Commonly known as: ELAVIL  Take 1 tablet (50 mg total) by mouth at bedtime.   amLODipine  10 MG tablet Commonly known as: NORVASC  TAKE 1 TABLET(10 MG) BY MOUTH DAILY   artificial tears ophthalmic solution Place 1 drop into both eyes 4 (four) times daily as needed (for dry/irritated eyes).   aspirin EC 81 MG tablet Take 81 mg by mouth 2 (two) times a week.   augmented betamethasone  dipropionate 0.05 % cream Commonly known as: DIPROLENE -AF Apply topically 2 (two) times daily as needed.   B-D UF III MINI PEN  NEEDLES 31G X 5 MM Misc Generic drug: Insulin  Pen Needle 1 Device by Other route in the morning, at noon, in the evening, and at bedtime.   cholecalciferol 25 MCG (1000 UNIT) tablet Commonly known as: VITAMIN D3 Take 2,000 Units by mouth daily.   Cranberry 475 MG Caps Take 1 capsule (475 mg total) by mouth 2 (two) times daily.   Dexcom G7 Sensor Misc 1 Device by Does not apply route as directed.   docusate sodium  100 MG capsule Commonly known as: COLACE Take 100 mg by mouth daily as needed for mild constipation.   doxycycline  100 MG tablet Commonly known as: VIBRA -TABS Take 1 tablet (100 mg total) by mouth 2 (two) times daily for 7 days. Started by: Dinah C Ngetich   DULoxetine  20 MG capsule Commonly known as: CYMBALTA  TAKE 1 CAPSULE(20 MG) BY MOUTH DAILY   ezetimibe  10 MG tablet Commonly known as: ZETIA  Take 1 tablet (10 mg total) by mouth daily.   fluconazole  150 MG tablet Commonly known as: DIFLUCAN  Repeat in 72 hours   fluticasone  0.05 % cream Commonly known as: CUTIVATE  Apply topically 2 (two) times daily as needed.   fluticasone  50 MCG/ACT nasal spray Commonly known as: FLONASE  SHAKE LIQUID AND USE 2 SPRAYS IN EACH NOSTRIL AT BEDTIME   folic acid  1 MG tablet Commonly known as: FOLVITE  Take 2 tablets (2 mg total) by mouth daily.   furosemide  40 MG tablet Commonly known as: LASIX  Take 1 tablet (40 mg total) by mouth daily. What changed:  medication strength how much to take how to take this when to take this additional instructions Changed by: Dinah C Ngetich   gabapentin  600 MG tablet Commonly known as: NEURONTIN  TAKE 1 TABLET(600 MG) BY MOUTH THREE TIMES DAILY   HumaLOG  KwikPen 200 UNIT/ML KwikPen Generic drug: insulin  lispro Max daily 30 units   ICY HOT MEDICATED SPRAY EX Apply 1 application topically as needed (shoulder pain).   Iodosorb 0.9 % gel Generic drug: cadexomer iodine  Apply 1 Application topically daily as needed for wound  care.   ipratropium-albuterol  0.5-2.5 (3) MG/3ML Soln Commonly known as: DUONEB Take 3 mLs by nebulization every 6 (six) hours as needed.  ketoconazole  2 % cream Commonly known as: NIZORAL  Apply 1 Application topically 2 (two) times daily.   levothyroxine  100 MCG tablet Commonly known as: SYNTHROID  Take 1 tablet (100 mcg total) by mouth daily before breakfast.   lisinopril  20 MG tablet Commonly known as: ZESTRIL  TAKE 1 TABLET BY MOUTH EVERY DAY   loperamide 2 MG tablet Commonly known as: IMODIUM A-D Take 2 mg by mouth 4 (four) times daily as needed for diarrhea or loose stools.   magnesium  oxide 400 MG tablet Commonly known as: MAG-OX Take 1 tablet (400 mg total) by mouth daily.   Melatonin 10 MG Tabs Take 10 mg by mouth at bedtime as needed (sleep).   metoprolol  tartrate 50 MG tablet Commonly known as: LOPRESSOR  TAKE 1 TABLET BY MOUTH TWICE DAILY   mirabegron  ER 50 MG Tb24 tablet Commonly known as: Myrbetriq  Take 1 tablet (50 mg total) by mouth daily.   MULTIVITAMIN ADULT PO Take 1 tablet by mouth daily.   nystatin  cream Commonly known as: MYCOSTATIN  Apply 1 Application topically 3 (three) times daily. To rashes under bilateral breasts and bilateral groin   nystatin  powder Commonly known as: MYCOSTATIN /NYSTOP  Apply 1 Application topically 3 (three) times daily.   omega-3 acid ethyl esters 1 g capsule Commonly known as: LOVAZA  TAKE ONE CAPSULE BY MOUTH EVERY DAY   ondansetron  4 MG tablet Commonly known as: Zofran  Take 1 tablet (4 mg total) by mouth every 8 (eight) hours as needed for nausea or vomiting.   oxyCODONE -acetaminophen  10-325 MG tablet Commonly known as: Percocet Take 1 tablet by mouth every 4 (four) hours as needed for pain.   pantoprazole  40 MG tablet Commonly known as: PROTONIX  Take 1 tablet (40 mg total) by mouth daily.   potassium chloride  SA 20 MEQ tablet Commonly known as: KLOR-CON  M Take 2 tablets (40 mEq total) by mouth  daily. Started by: Dinah C Ngetich   promethazine  12.5 MG tablet Commonly known as: PHENERGAN  as needed.   Semaglutide  (2 MG/DOSE) 8 MG/3ML Sopn Inject 2 mg as directed once a week.   sodium chloride  0.65 % nasal spray Commonly known as: OCEAN Place 2 sprays into the nose as needed for congestion.   tiotropium 18 MCG inhalation capsule Commonly known as: SPIRIVA  Place 18 mcg into inhaler and inhale as needed.   tiZANidine  2 MG tablet Commonly known as: ZANAFLEX  TAKE 1 TABLET(2 MG) BY MOUTH THREE TIMES DAILY   Toujeo  SoloStar 300 UNIT/ML Solostar Pen Generic drug: insulin  glargine (1 Unit Dial) 36 units QAM and 36 units  QPM   triamterene -hydrochlorothiazide  37.5-25 MG tablet Commonly known as: MAXZIDE -25 TAKE 1 TABLET BY MOUTH DAILY   venlafaxine  XR 75 MG 24 hr capsule Commonly known as: EFFEXOR -XR TAKE 1 CAPSULE BY MOUTH EVERY DAY WITH BREAKFAST   Vitamin D  (Ergocalciferol ) 1.25 MG (50000 UNIT) Caps capsule Commonly known as: DRISDOL  Take 1 capsule (50,000 Units total) by mouth every 7 (seven) days.   Xeljanz  5 MG Tabs Generic drug: Tofacitinib  Citrate TAKE 1 TABLET BY MOUTH DAILY         OBJECTIVE:   Vital Signs: BP 126/74 (BP Location: Left Arm, Patient Position: Sitting, Cuff Size: Normal)   Pulse 61   Ht 5' 6 (1.676 m)   SpO2 99%   BMI 37.80 kg/m   Wt Readings from Last 3 Encounters:  04/02/24 234 lb 3.2 oz (106.2 kg)  03/28/24 226 lb (102.5 kg)  03/20/24 224 lb 3.2 oz (101.7 kg)     Exam: General: Pt  appears well and is in NAD  Lungs: Clear with good BS bilat   Heart: RRR   Extremities: 1+ pretibial edema.  Patient has an infection of the right second toe, with erythema and swelling  Neuro: MS is good with appropriate affect, pt is alert and Ox3    DM foot exam:1/116/2025 per podiatry   DATA REVIEWED:  Lab Results  Component Value Date   HGBA1C 7.1 (A) 04/02/2024   HGBA1C 6.2 (A) 10/04/2023   HGBA1C 6.8 (A) 04/03/2023    Latest  Reference Range & Units 04/02/24 11:01  TSH 0.40 - 4.50 mIU/L 2.37     Latest Reference Range & Units 10/04/23 10:56  Microalb, Ur mg/dL 1.9  MICROALB/CREAT RATIO <30 mg/g creat 19  Creatinine, Urine 20 - 275 mg/dL 898      Latest Reference Range & Units 06/27/23 09:01  Sodium 135 - 146 mmol/L 144  Potassium 3.5 - 5.3 mmol/L 3.9  Chloride 98 - 110 mmol/L 104  CO2 20 - 32 mmol/L 32  Glucose 65 - 99 mg/dL 46 (L)  BUN 7 - 25 mg/dL 12  Creatinine 9.39 - 9.04 mg/dL 9.23  Calcium  8.6 - 10.4 mg/dL 9.4  BUN/Creatinine Ratio 6 - 22 (calc) SEE NOTE:  eGFR > OR = 60 mL/min/1.37m2 78  AG Ratio 1.0 - 2.5 (calc) 1.3  AST 10 - 35 U/L 30  ALT 6 - 29 U/L 32 (H)  Total Protein 6.1 - 8.1 g/dL 6.3  Total Bilirubin 0.2 - 1.2 mg/dL 0.3     Latest Reference Range & Units 03/08/23 10:07  Total CHOL/HDL Ratio <5.0 (calc) 3.9  Cholesterol <200 mg/dL 853  HDL Cholesterol > OR = 50 mg/dL 37 (L)  LDL Cholesterol (Calc) mg/dL (calc) 80  Non-HDL Cholesterol (Calc) <130 mg/dL (calc) 890  Triglycerides <150 mg/dL 807 (H)      ASSESSMENT / PLAN / RECOMMENDATIONS:   1) Type 2 Diabetes Mellitus, With  Neuropathic complications - Most recent A1c of 7.1 %. Goal A1c < 7.5 %.    -A1c optimal -Intolerant to SGLT-2 inhibitors due to severe genital irritations  - Discussed the portance of low-carb diet, as the patient does admit to some dietary indiscretions lately - I will change her sensitivity factor from 35 to 40, as she has been noted with hypoglycemia after a bolus  MEDICATIONS:  -Continue Ozempic  2 mg weekly  - Continue Toujeo  36 units BID - Continue   Humalog  5 units with each meal  - Change CF : Humalog  ( BG - 130/40 )    EDUCATION / INSTRUCTIONS: BG monitoring instructions: Patient is instructed to check her blood sugars 3 times a day, before meals . Call [facility] if: BG persistently < 70  I reviewed the Rule of 15 for the treatment of hypoglycemia in detail with the  patient. Literature supplied.   2) Diabetic complications:  Eye: Does not have known diabetic retinopathy.  Neuro/ Feet: Does  have known diabetic peripheral neuropathy .  Renal: Patient does not have known baseline CKD.    3) Hypothyroidism:   -Patient with multiple nonspecific symptoms -TSH remains within normal range, no change  Medication  levothyroxine  100 mcg daily  4) Right toe infection:  - Patient was prescribed antibiotics through PCPs office - Patient urged to follow-up with podiatry for further monitoring   F/U in 6 months     Signed electronically by: Stefano Redgie Butts, MD  [facility]  [facility] [address] [place], [place] [address] Phone: [phone] FAX: [phone]   CC: Leonarda Roxan BROCKS, NP [address] [place] [address] Phone: [phone]  Fax: [phone]  Return to Endocrinology clinic as below: Future Appointments  Date Time Provider Department Center  04/03/2024  9:20 AM Ngetich, Roxan BROCKS, NP [facility] None  04/11/2024  8:30 AM Cheryl Waddell HERO, PA-C [facility] None  04/17/2024 10:00 AM Ngetich, Roxan BROCKS, NP [facility] None  04/26/2024  9:40 AM Debby Fidela CROME, NP [facility]

## 2024-04-02 NOTE — Patient Instructions (Addendum)
-    Continue Ozempic  2 mg once weekly  - Continue Toujeo  36 units every morning and 36 units every evening  - Humalog  correctional insulin : Use the scale below to help guide you BEFORE each meal  Blood sugar before meal Number of units to inject  80- 170 5 units  171 - 210 6 units   211 - 250 7 units  251 - 290 8 units   291 - 330 9 units          HOW TO TREAT LOW BLOOD SUGARS (Blood sugar LESS THAN 70 MG/DL) Please follow the RULE OF 15 for the treatment of hypoglycemia treatment (when your (blood sugars are less than 70 mg/dL)   STEP 1: Take 15 grams of carbohydrates when your blood sugar is low, which includes:  3-4 GLUCOSE TABS  OR 3-4 OZ OF JUICE OR REGULAR SODA OR ONE TUBE OF GLUCOSE GEL    STEP 2: RECHECK blood sugar in 15 MINUTES STEP 3: If your blood sugar is still low at the 15 minute recheck --> then, go back to STEP 1 and treat AGAIN with another 15 grams of carbohydrates.

## 2024-04-03 ENCOUNTER — Encounter: Admitting: Family

## 2024-04-03 ENCOUNTER — Ambulatory Visit: Payer: Self-pay | Admitting: Internal Medicine

## 2024-04-04 NOTE — Progress Notes (Signed)
 Provider: Roxan Plough FNP-C  Shera Laubach, Roxan BROCKS, NP  Patient Care Team: Sargon Scouten, Roxan BROCKS, NP as PCP - General (Family Medicine) Dolphus Reiter, MD (Rheumatology) Altheimer, Ozell, MD as Attending Physician (Endocrinology) Harden Jerona GAILS, MD as Consulting Physician (Orthopedic Surgery)  Extended Emergency Contact Information Primary Emergency Contact: [facility] Address: [address]          [place], [place] [address] [place] Home Phone: [phone] Relation: Son Secondary Emergency Contact: Jens Dorn JACOBS, [place] Phone: [phone] Relation: Son  Code Status:  Full Code  Goals of care: Advanced Directive information    04/02/2024    9:14 AM  Advanced Directives  Does Patient Have a Medical Advance Directive? Yes  Type of Advance Directive Out of facility DNR (pink MOST or yellow form)  Does patient want to make changes to medical advance directive? No - Patient declined     Chief Complaint  Patient presents with   Leg Swelling    Redness    Discussed the use of AI scribe software for clinical note transcription with the patient, who gave verbal consent to proceed.  History of Present Illness   Katherine Delgado is an 83 year old female who presents with worsening leg swelling and blistering.  She has been experiencing leg swelling with the development of water blisters. Some blisters have resolved, while others persist. She takes furosemide  20 mg for fluid management and notes stable weight, although her shoes are tight due to swelling.  No fever or chills, but she feels cold, attributing this to her thyroid  condition. There is no recent worsening of COPD symptoms, such as increased coughing or shortness of breath.  She has a history of a diabetic ulcer on her right foot, which has been managed by a foot doctor for over three years. Recently, she stubbed her second toe on the right foot,  which is now red and swollen. She also has a history of a previous fracture of the first toe on the right foot, which healed without intervention.  She experiences pain in her calf muscle, especially when pressure is applied. There is drainage from a blister on her toe, and she needs to reapply a bandage.  She regularly monitors her weight and blood pressure at home and adheres to her insulin  and other medications as prescribed. She takes potassium once or twice a day and mentions a need for magnesium  supplementation.   Past Medical History:  Diagnosis Date   Abnormality of gait 04/19/2016   Acute infective polyneuritis (HCC) 2002   Allergic rhinitis due to pollen    Chronic pain syndrome    COPD (chronic obstructive pulmonary disease) (HCC)    chronic bronchitis   Depressive disorder, not elsewhere classified    Diabetes mellitus without complication (HCC)    Diaphragmatic hernia without mention of obstruction or gangrene    Dyslipidemia    Fibromyalgia    GERD (gastroesophageal reflux disease)    with h/o esophagitis   Guillain-Barre syndrome (HCC)    History of benign thymus tumor    Insomnia, unspecified    Lumbar spinal stenosis 03/30/2018   L4-5 level, severe   Miscarriage 1962   Mixed hyperlipidemia    Morbid obesity (HCC)    Osteoporosis    Pneumonia    Polyneuropathy in diabetes(357.2)    Rheumatoid arthritis(714.0)    Spondylosis, lumbosacral    Spontaneous ecchymoses    Type II  or unspecified type diabetes mellitus with peripheral circulatory disorders, uncontrolled(250.72)    Unspecified chronic bronchitis (HCC)    Unspecified essential hypertension    Unspecified hypothyroidism    Unspecified pruritic disorder    Unspecified urinary incontinence    Past Surgical History:  Procedure Laterality Date   ABDOMINAL HYSTERECTOMY  1974   abdominal tumor  2002   APPENDECTOMY     APPLICATION OF A-CELL OF BACK N/A 09/26/2018   Procedure: With Acell;  Surgeon: Lowery Estefana RAMAN, DO;  Location: [facility] OR;  Service: Plastics;  Laterality: N/A;   APPLICATION OF WOUND VAC N/A 09/26/2018   Procedure: Vac placement;  Surgeon: Lowery Estefana RAMAN, DO;  Location: [facility] OR;  Service: Plastics;  Laterality: N/A;   BACK SURGERY  1982   BRONCHOSCOPY  2001   CATARACT EXTRACTION, BILATERAL  09/2019, 10/2019   CHOLECYSTECTOMY  1984   INCISION AND DRAINAGE OF WOUND N/A 09/26/2018   Procedure: Debridement of spine wound;  Surgeon: Lowery Estefana RAMAN, DO;  Location: [facility] OR;  Service: Plastics;  Laterality: N/A;   KNEE SURGERY Bilateral 08/27/2010 (L) and 01/11/2011 (R)   LUMBAR LAMINECTOMY/DECOMPRESSION MICRODISCECTOMY N/A 07/03/2018   Procedure: Lumbar Three to Lumbar Five Laminectomy;  Surgeon: Cheryle Debby LABOR, MD;  Location: [facility] OR;  Service: Neurosurgery;  Laterality: N/A;   LUMBAR WOUND DEBRIDEMENT N/A 08/20/2018   Procedure: POSTERIOR LUMBAR SPINAL WOUND DEBRIDEMENT AND REVISION;  Surgeon: Cheryle Debby LABOR, MD;  Location: [facility] OR;  Service: Neurosurgery;  Laterality: N/A;  POSTERIOR LUMBAR SPINAL WOUND REVISION   miscarrage  1962   OVARIAN CYST SURGERY  1968   thymus tumor     thymus tumor  10/2000   TONSILLECTOMY      Allergies  Allergen Reactions   Influenza Vaccines Other (See Comments)    h/o Guillain Barre; dr's told me years ago never to take another flu shot as it could relapse Julee Birch; has to do with when vaccine being changed to H1N1 virus   Penicillins Hives, Itching, Swelling and Rash    Has patient had a PCN reaction causing immediate rash, facial/tongue/throat swelling, SOB or lightheadedness with hypotension:Unknown Has patient had a PCN reaction causing severe rash involving mucus membranes or skin necrosis: Unknown Has patient had a PCN reaction that required hospitalization:Unknown Has patient had a PCN reaction occurring within the last 10 years: Unknown If all of the above answers are NO, then may proceed with Cephalosporin use.     Statins Other (See Comments)    Myopathy, transaminitis   Sulfamethoxazole -Trimethoprim  Itching    Outpatient Encounter Medications as of 04/02/2024  Medication Sig   amitriptyline  (ELAVIL ) 50 MG tablet Take 1 tablet (50 mg total) by mouth at bedtime.   amLODipine  (NORVASC ) 10 MG tablet TAKE 1 TABLET(10 MG) BY MOUTH DAILY   aspirin EC 81 MG tablet Take 81 mg by mouth 2 (two) times a week.   augmented betamethasone  dipropionate (DIPROLENE -AF) 0.05 % cream Apply topically 2 (two) times daily as needed.   cadexomer iodine  (IODOSORB) 0.9 % gel Apply 1 Application topically daily as needed for wound care.   cholecalciferol (VITAMIN D3) 25 MCG (1000 UT) tablet Take 2,000 Units by mouth daily.   Continuous Glucose Sensor (DEXCOM G7 SENSOR) MISC 1 Device by Does not apply route as directed.   Cranberry 475 MG CAPS Take 1 capsule (475 mg total) by mouth 2 (two) times daily.   docusate sodium  (COLACE) 100 MG capsule Take 100 mg by mouth daily as needed  for mild constipation.   doxycycline  (VIBRA -TABS) 100 MG tablet Take 1 tablet (100 mg total) by mouth 2 (two) times daily for 7 days.   DULoxetine  (CYMBALTA ) 20 MG capsule TAKE 1 CAPSULE(20 MG) BY MOUTH DAILY   ezetimibe  (ZETIA ) 10 MG tablet Take 1 tablet (10 mg total) by mouth daily.   fluconazole  (DIFLUCAN ) 150 MG tablet Repeat in 72 hours   fluticasone  (CUTIVATE ) 0.05 % cream Apply topically 2 (two) times daily as needed.   fluticasone  (FLONASE ) 50 MCG/ACT nasal spray SHAKE LIQUID AND USE 2 SPRAYS IN EACH NOSTRIL AT BEDTIME   folic acid  (FOLVITE ) 1 MG tablet Take 2 tablets (2 mg total) by mouth daily.   gabapentin  (NEURONTIN ) 600 MG tablet TAKE 1 TABLET(600 MG) BY MOUTH THREE TIMES DAILY   insulin  glargine, 1 Unit Dial, (TOUJEO  SOLOSTAR) 300 UNIT/ML Solostar Pen 36 units QAM and 36 units  QPM   insulin  lispro (HUMALOG  KWIKPEN) 200 UNIT/ML KwikPen Max daily 30 units   Insulin  Pen Needle (B-D UF III MINI PEN NEEDLES) 31G X 5 MM MISC 1 Device by Other  route in the morning, at noon, in the evening, and at bedtime.   ketoconazole  (NIZORAL ) 2 % cream Apply 1 Application topically 2 (two) times daily.   levothyroxine  (SYNTHROID ) 100 MCG tablet Take 1 tablet (100 mcg total) by mouth daily before breakfast.   lisinopril  (ZESTRIL ) 20 MG tablet TAKE 1 TABLET BY MOUTH EVERY DAY   loperamide (IMODIUM A-D) 2 MG tablet Take 2 mg by mouth 4 (four) times daily as needed for diarrhea or loose stools.   Melatonin 10 MG TABS Take 10 mg by mouth at bedtime as needed (sleep).   Menthol , Topical Analgesic, (ICY HOT MEDICATED SPRAY EX) Apply 1 application topically as needed (shoulder pain).   metoprolol  tartrate (LOPRESSOR ) 50 MG tablet TAKE 1 TABLET BY MOUTH TWICE DAILY   mirabegron  ER (MYRBETRIQ ) 50 MG TB24 tablet Take 1 tablet (50 mg total) by mouth daily.   Multiple Vitamins-Minerals (MULTIVITAMIN ADULT PO) Take 1 tablet by mouth daily.   nystatin  (MYCOSTATIN /NYSTOP ) powder Apply 1 Application topically 3 (three) times daily.   nystatin  cream (MYCOSTATIN ) Apply 1 Application topically 3 (three) times daily. To rashes under bilateral breasts and bilateral groin   omega-3 acid ethyl esters (LOVAZA ) 1 g capsule TAKE ONE CAPSULE BY MOUTH EVERY DAY   ondansetron  (ZOFRAN ) 4 MG tablet Take 1 tablet (4 mg total) by mouth every 8 (eight) hours as needed for nausea or vomiting.   oxyCODONE -acetaminophen  (PERCOCET) 10-325 MG tablet Take 1 tablet by mouth every 4 (four) hours as needed for pain.   pantoprazole  (PROTONIX ) 40 MG tablet Take 1 tablet (40 mg total) by mouth daily.   polyvinyl alcohol  (LIQUIFILM TEARS) 1.4 % ophthalmic solution Place 1 drop into both eyes 4 (four) times daily as needed (for dry/irritated eyes).    potassium chloride  SA (KLOR-CON  M) 20 MEQ tablet Take 2 tablets (40 mEq total) by mouth daily.   promethazine  (PHENERGAN ) 12.5 MG tablet as needed.   Semaglutide , 2 MG/DOSE, 8 MG/3ML SOPN Inject 2 mg as directed once a week.   sodium chloride   (OCEAN) 0.65 % nasal spray Place 2 sprays into the nose as needed for congestion.   tiotropium (SPIRIVA ) 18 MCG inhalation capsule Place 18 mcg into inhaler and inhale as needed.   tiZANidine  (ZANAFLEX ) 2 MG tablet TAKE 1 TABLET(2 MG) BY MOUTH THREE TIMES DAILY   triamterene -hydrochlorothiazide  (MAXZIDE -25) 37.5-25 MG tablet TAKE 1 TABLET BY MOUTH DAILY  venlafaxine  XR (EFFEXOR -XR) 75 MG 24 hr capsule TAKE 1 CAPSULE BY MOUTH EVERY DAY WITH BREAKFAST   Vitamin D , Ergocalciferol , (DRISDOL ) 1.25 MG (50000 UNIT) CAPS capsule Take 1 capsule (50,000 Units total) by mouth every 7 (seven) days.   XELJANZ  5 MG TABS TAKE 1 TABLET BY MOUTH DAILY   [DISCONTINUED] furosemide  (LASIX ) 20 MG tablet TAKE 1 BY MOUTH TWICE DAILY FOR 3 DAYS AS NEEDED FOR 3 POUND WEIGHT GAIN IN ONE DAY OR 5 POUNDS IN ONE WEEK   [DISCONTINUED] ipratropium-albuterol  (DUONEB) 0.5-2.5 (3) MG/3ML SOLN Take 3 mLs by nebulization every 6 (six) hours as needed.   [DISCONTINUED] magnesium  oxide (MAG-OX) 400 MG tablet Take 1 tablet (400 mg total) by mouth daily.   [DISCONTINUED] potassium chloride  (MICRO-K ) 10 MEQ CR capsule Take 2 capsules (20 mEq total) by mouth 2 (two) times daily. When Taking the Furosemide .   furosemide  (LASIX ) 40 MG tablet Take 1 tablet (40 mg total) by mouth daily.   ipratropium-albuterol  (DUONEB) 0.5-2.5 (3) MG/3ML SOLN Take 3 mLs by nebulization every 6 (six) hours as needed.   magnesium  oxide (MAG-OX) 400 MG tablet Take 1 tablet (400 mg total) by mouth daily.   [DISCONTINUED] furosemide  (LASIX ) 40 MG tablet TAKE 1 BY MOUTH TWICE DAILY FOR 3 DAYS AS NEEDED FOR 3 POUND WEIGHT GAIN IN ONE DAY OR 5 POUNDS IN ONE WEEK   No facility-administered encounter medications on file as of 04/02/2024.    Review of Systems  Constitutional:  Negative for appetite change, chills, fatigue, fever and unexpected weight change.  HENT:  Negative for congestion, dental problem, ear discharge, ear pain, facial swelling, hearing loss,  nosebleeds, postnasal drip, rhinorrhea, sinus pressure, sinus pain, sneezing, sore throat, tinnitus and trouble swallowing.   Eyes:  Negative for pain, discharge, redness, itching and visual disturbance.  Respiratory:  Negative for cough, chest tightness, shortness of breath and wheezing.   Cardiovascular:  Positive for leg swelling. Negative for chest pain and palpitations.  Gastrointestinal:  Negative for abdominal distention, abdominal pain, blood in stool, constipation, diarrhea, nausea and vomiting.  Genitourinary:  Negative for difficulty urinating, dysuria, flank pain, frequency and urgency.       Incontinent   Musculoskeletal:  Positive for gait problem. Negative for arthralgias, back pain, joint swelling, myalgias, neck pain and neck stiffness.  Skin:  Negative for color change, pallor, rash and wound.  Neurological:  Negative for dizziness, weakness, light-headedness, numbness and headaches.    Immunization History  Administered Date(s) Administered   Influenza-Unspecified 06/10/2011   PFIZER(Purple Top)SARS-COV-2 Vaccination 11/13/2019, 11/30/2019   Pneumococcal Conjugate-13 07/07/2016   Pneumococcal Polysaccharide-23 11/21/2003, 10/05/2017   Tdap 05/28/2016   Pertinent  Health Maintenance Due  Topic Date Due   FOOT EXAM  03/07/2024   OPHTHALMOLOGY EXAM  06/11/2024   HEMOGLOBIN A1C  10/03/2024   DEXA SCAN  09/20/2025   INFLUENZA VACCINE  Discontinued      09/12/2023   10:25 AM 11/07/2023   10:47 AM 12/28/2023   10:47 AM 03/01/2024    8:56 AM 03/28/2024    9:25 AM  Fall Risk  Falls in the past year? 0 0 0 0 0  Was there an injury with Fall?    0 0  Fall Risk Category Calculator    0 0  Patient at Risk for Falls Due to   Impaired balance/gait     Functional Status Survey:    Vitals:   04/02/24 0921  BP: 126/70  Pulse: 63  Resp: 18  Temp:  97.8 F (36.6 C)  SpO2: 96%  Weight: 234 lb 3.2 oz (106.2 kg)   Body mass index is 37.8 kg/m. Physical Exam  VITALS: T-  97.8, P- 67, BP- 126/70, SaO2- 96% MEASUREMENTS: Weight- 234.2. GENERAL: Alert, cooperative, well developed, no acute distress. HEENT: Normocephalic, normal oropharynx, moist mucous membranes. CHEST: Clear to auscultation bilaterally, no wheezes, rhonchi, or crackles. CARDIOVASCULAR: Normal heart rate and rhythm, S1 and S2 normal without murmurs. ABDOMEN: Soft, non-tender, non-distended, without organomegaly, normal bowel sounds. EXTREMITIES: Pulses palpable in feet, pitting edema in legs, mild tenderness on calf palpation. NEUROLOGICAL: Cranial nerves grossly intact, moves all extremities without gross motor or sensory deficit.   Labs reviewed: Recent Labs    06/27/23 0901 11/09/23 1530  NA 144 139  K 3.9 4.5  CL 104 102  CO2 32 28  GLUCOSE 46* 126*  BUN 12 23  CREATININE 0.76 1.17*  CALCIUM  9.4 9.2   Recent Labs    06/27/23 0901 11/09/23 1530  AST 30 25  ALT 32* 19  BILITOT 0.3 0.4  PROT 6.3 6.8   Recent Labs    06/27/23 0901 11/09/23 1530  WBC 14.6* 14.5*  NEUTROABS 10,921* 10,962*  HGB 14.0 14.0  HCT 44.3 43.4  MCV 86.9 85.4  PLT 331 331   Lab Results  Component Value Date   TSH 2.37 04/02/2024   Lab Results  Component Value Date   HGBA1C 7.1 (A) 04/02/2024   Lab Results  Component Value Date   CHOL 147 11/09/2023   HDL 40 (L) 11/09/2023   LDLCALC 77 11/09/2023   TRIG 206 (H) 11/09/2023   CHOLHDL 3.7 11/09/2023    Significant Diagnostic Results in last 30 days:  No results found.  Assessment/Plan  Lower extremity edema with chronic venous stasis ulcer and cellulitis of right foot and toe Chronic lower extremity edema with associated venous stasis ulcer and cellulitis of the right foot and toe. Reports swelling, redness, and blistering. No fever or systemic signs of infection. Allergies to penicillin, statin, sulfa , and flu shot noted. - Increase furosemide  dosage to 40 mg to manage fluid retention. - Prescribe doxycycline , 1 tablet in the  morning and 1 in the evening for 7 days, to treat cellulitis. - Advise wearing compression stockings to help reduce swelling. - Instruct to monitor weight daily and take an extra fluid pill if there is abrupt weight gain. - Advise to report if redness or drainage from the toe worsens.  Medication-induced hypokalemia Risk of hypokalemia due to increased furosemide  dosage. - Prescribe potassium supplementation, 20 mg twice daily. - Instruct to take potassium with the fluid pill to replace potassium lost due to diuretic use.  Chronic obstructive pulmonary disease (COPD) Well-managed COPD with no recent exacerbations. Reports coughing but no worsening of symptoms. Lungs are clear on examination. - Continue current inhalers and nebulizer treatment with Duoneb.  Urinary incontinence Chronic urinary incontinence, exacerbated by diuretic use.  General Health Maintenance Discussion about the importance of monitoring weight and blood pressure regularly to manage fluid retention and overall health. - Advise to weigh daily and monitor blood pressure regularly.   Family/ staff Communication: Reviewed plan of care with patient verbalized understanding   Labs/tests ordered: None   Next Appointment: Return in about 2 weeks (around 04/16/2024) for bilateral lower extremitiy edema and right 2 nd toe cellulitis .   Total time: 26 minutes. Greater than 50% of total time spent doing patient education regarding Lower extremity edema ,COPD,Hypokalemia Urine Incontinence,health maintenance including symptom/medication  management.   Roxan JAYSON Plough, NP

## 2024-04-05 ENCOUNTER — Telehealth: Payer: Self-pay

## 2024-04-05 MED ORDER — LEVOTHYROXINE SODIUM 100 MCG PO TABS: 100.0000 ug | ORAL_TABLET | Freq: Every day | ORAL | 3 refills | Status: DC

## 2024-04-05 NOTE — Telephone Encounter (Signed)
 Patient called in requesting to have the Levothyroxine  switch to the brand. I spoke with Dr. Sam and she okay for me to send the brand to pharmacy.

## 2024-04-09 ENCOUNTER — Ambulatory Visit: Admitting: Family

## 2024-04-09 ENCOUNTER — Other Ambulatory Visit: Payer: Self-pay | Admitting: Family

## 2024-04-11 ENCOUNTER — Encounter: Payer: Self-pay | Admitting: Physician Assistant

## 2024-04-11 ENCOUNTER — Ambulatory Visit: Attending: Physician Assistant | Admitting: Physician Assistant

## 2024-04-11 VITALS — BP 154/73 | HR 64 | Resp 16 | Ht 66.0 in

## 2024-04-11 DIAGNOSIS — Z8719 Personal history of other diseases of the digestive system: Secondary | ICD-10-CM

## 2024-04-11 DIAGNOSIS — M797 Fibromyalgia: Secondary | ICD-10-CM | POA: Diagnosis not present

## 2024-04-11 DIAGNOSIS — M170 Bilateral primary osteoarthritis of knee: Secondary | ICD-10-CM | POA: Diagnosis not present

## 2024-04-11 DIAGNOSIS — Z8659 Personal history of other mental and behavioral disorders: Secondary | ICD-10-CM

## 2024-04-11 DIAGNOSIS — G4709 Other insomnia: Secondary | ICD-10-CM | POA: Diagnosis not present

## 2024-04-11 DIAGNOSIS — Z8639 Personal history of other endocrine, nutritional and metabolic disease: Secondary | ICD-10-CM

## 2024-04-11 DIAGNOSIS — M0579 Rheumatoid arthritis with rheumatoid factor of multiple sites without organ or systems involvement: Secondary | ICD-10-CM

## 2024-04-11 DIAGNOSIS — M810 Age-related osteoporosis without current pathological fracture: Secondary | ICD-10-CM | POA: Diagnosis not present

## 2024-04-11 DIAGNOSIS — M51369 Other intervertebral disc degeneration, lumbar region without mention of lumbar back pain or lower extremity pain: Secondary | ICD-10-CM | POA: Diagnosis not present

## 2024-04-11 DIAGNOSIS — Z8709 Personal history of other diseases of the respiratory system: Secondary | ICD-10-CM

## 2024-04-11 DIAGNOSIS — E785 Hyperlipidemia, unspecified: Secondary | ICD-10-CM

## 2024-04-11 DIAGNOSIS — M3501 Sicca syndrome with keratoconjunctivitis: Secondary | ICD-10-CM

## 2024-04-11 DIAGNOSIS — Z8669 Personal history of other diseases of the nervous system and sense organs: Secondary | ICD-10-CM

## 2024-04-11 DIAGNOSIS — Z79899 Other long term (current) drug therapy: Secondary | ICD-10-CM

## 2024-04-11 DIAGNOSIS — B029 Zoster without complications: Secondary | ICD-10-CM

## 2024-04-11 DIAGNOSIS — Z8679 Personal history of other diseases of the circulatory system: Secondary | ICD-10-CM

## 2024-04-15 ENCOUNTER — Ambulatory Visit: Payer: Self-pay | Admitting: Physician Assistant

## 2024-04-15 NOTE — Progress Notes (Signed)
 CBC stable.  Creatinine is borderline elevated at 1.01 and GFR is 55.  Avoid the use of NSAIDs.  Rest of CMP within normal limits. Ro antibody remains positive consistent with Sjogren syndrome. SPEP did not to reveal any abnormal protein bands.  ANA is negative and La antibody negative.  Complements within normal limits.

## 2024-04-16 LAB — COMPREHENSIVE METABOLIC PANEL WITH GFR
AG Ratio: 1.2 (calc) (ref 1.0–2.5)
ALT: 9 U/L (ref 6–29)
AST: 12 U/L (ref 10–35)
Albumin: 3.8 g/dL (ref 3.6–5.1)
Alkaline phosphatase (APISO): 135 U/L (ref 37–153)
BUN/Creatinine Ratio: 22 (calc) (ref 6–22)
BUN: 22 mg/dL (ref 7–25)
CO2: 27 mmol/L (ref 20–32)
Calcium: 9.4 mg/dL (ref 8.6–10.4)
Chloride: 103 mmol/L (ref 98–110)
Creat: 1.01 mg/dL — ABNORMAL HIGH (ref 0.60–0.95)
Globulin: 3.3 g/dL (ref 1.9–3.7)
Glucose, Bld: 71 mg/dL (ref 65–99)
Potassium: 4.2 mmol/L (ref 3.5–5.3)
Sodium: 141 mmol/L (ref 135–146)
Total Bilirubin: 0.3 mg/dL (ref 0.2–1.2)
Total Protein: 7.1 g/dL (ref 6.1–8.1)
eGFR: 55 mL/min/1.73m2 — ABNORMAL LOW (ref >=60)

## 2024-04-16 LAB — QUANTIFERON-TB GOLD PLUS
Mitogen-NIL: >10 [IU]/mL
NIL: 0.02 [IU]/mL
QuantiFERON-TB Gold Plus: NEGATIVE
TB1-NIL: 0.01 [IU]/mL
TB2-NIL: 0.01 [IU]/mL

## 2024-04-16 LAB — PROTEIN ELECTROPHORESIS, SERUM, WITH REFLEX
Albumin ELP: 3.5 g/dL — ABNORMAL LOW (ref 3.8–4.8)
Alpha 1: 0.4 g/dL — ABNORMAL HIGH (ref 0.2–0.3)
Alpha 2: 0.9 g/dL (ref 0.5–0.9)
Beta 2: 0.6 g/dL — ABNORMAL HIGH (ref 0.2–0.5)
Beta Globulin: 0.5 g/dL (ref 0.4–0.6)
Gamma Globulin: 1.1 g/dL (ref 0.8–1.7)
Total Protein: 6.9 g/dL (ref 6.1–8.1)

## 2024-04-16 LAB — CBC WITH DIFFERENTIAL/PLATELET
Absolute Lymphocytes: 2507 {cells}/uL (ref 850–3900)
Absolute Monocytes: 1274 {cells}/uL — ABNORMAL HIGH (ref 200–950)
Basophils Absolute: 164 {cells}/uL (ref 0–200)
Basophils Relative: 1.2 %
Eosinophils Absolute: 548 {cells}/uL — ABNORMAL HIGH (ref 15–500)
Eosinophils Relative: 4 %
HCT: 46.1 % — ABNORMAL HIGH (ref 35.0–45.0)
Hemoglobin: 14.4 g/dL (ref 11.7–15.5)
MCH: 27.7 pg (ref 27.0–33.0)
MCHC: 31.2 g/dL — ABNORMAL LOW (ref 32.0–36.0)
MCV: 88.7 fL (ref 80.0–100.0)
MPV: 10 fL (ref 7.5–12.5)
Monocytes Relative: 9.3 %
Neutro Abs: 9206 {cells}/uL — ABNORMAL HIGH (ref 1500–7800)
Neutrophils Relative %: 67.2 %
Platelets: 437 Thousand/uL — ABNORMAL HIGH (ref 140–400)
RBC: 5.2 Million/uL — ABNORMAL HIGH (ref 3.80–5.10)
RDW: 13.2 % (ref 11.0–15.0)
Total Lymphocyte: 18.3 %
WBC: 13.7 Thousand/uL — ABNORMAL HIGH (ref 3.8–10.8)

## 2024-04-16 LAB — SJOGRENS SYNDROME-B EXTRACTABLE NUCLEAR ANTIBODY: SSB (La) (ENA) Antibody, IgG: <1 AI

## 2024-04-16 LAB — C3 AND C4
C3 Complement: 174 mg/dL
C4 Complement: 26 mg/dL

## 2024-04-16 LAB — ANA: Anti Nuclear Antibody (ANA): NEGATIVE

## 2024-04-16 LAB — SJOGRENS SYNDROME-A EXTRACTABLE NUCLEAR ANTIBODY: SSA (Ro) (ENA) Antibody, IgG: 6.5 AI — AB

## 2024-04-16 NOTE — Progress Notes (Signed)
 TB gold negative

## 2024-04-17 ENCOUNTER — Encounter: Payer: Self-pay | Admitting: Family

## 2024-04-17 ENCOUNTER — Ambulatory Visit (INDEPENDENT_AMBULATORY_CARE_PROVIDER_SITE_OTHER): Admitting: Family

## 2024-04-17 VITALS — BP 138/76 | HR 69 | Temp 97.8°F | Resp 19 | Ht 66.0 in | Wt 210.6 lb

## 2024-04-17 DIAGNOSIS — R600 Localized edema: Secondary | ICD-10-CM

## 2024-04-17 DIAGNOSIS — M5441 Lumbago with sciatica, right side: Secondary | ICD-10-CM

## 2024-04-17 DIAGNOSIS — G8929 Other chronic pain: Secondary | ICD-10-CM | POA: Diagnosis not present

## 2024-04-17 DIAGNOSIS — Z794 Long term (current) use of insulin: Secondary | ICD-10-CM | POA: Diagnosis not present

## 2024-04-17 DIAGNOSIS — I10 Essential (primary) hypertension: Secondary | ICD-10-CM | POA: Diagnosis not present

## 2024-04-17 DIAGNOSIS — B372 Candidiasis of skin and nail: Secondary | ICD-10-CM | POA: Diagnosis not present

## 2024-04-17 DIAGNOSIS — L89310 Pressure ulcer of right buttock, unstageable: Secondary | ICD-10-CM | POA: Diagnosis not present

## 2024-04-17 DIAGNOSIS — E039 Hypothyroidism, unspecified: Secondary | ICD-10-CM | POA: Diagnosis not present

## 2024-04-17 DIAGNOSIS — E1142 Type 2 diabetes mellitus with diabetic polyneuropathy: Secondary | ICD-10-CM

## 2024-04-17 MED ORDER — NYSTATIN 100000 UNIT/GM EX POWD: 1.0000 | Freq: Three times a day (TID) | CUTANEOUS | 3 refills | Status: AC

## 2024-04-17 MED ORDER — FLUCONAZOLE 150 MG PO TABS: ORAL_TABLET | ORAL | 0 refills | Status: AC

## 2024-04-17 NOTE — Patient Instructions (Signed)
 1.Stop at check out and schedule Annual Wellness Visit.

## 2024-04-22 NOTE — Progress Notes (Signed)
 Provider: Roxan Plough FNP-C   Katherine Delgado, Katherine BROCKS, NP  Patient Care Team: Katherine Delgado, Katherine BROCKS, NP as PCP - General (Family Medicine) Katherine Reiter, MD (Rheumatology) Katherine Delgado, Ozell, MD as Attending Physician (Endocrinology) Katherine Jerona GAILS, MD as Consulting Physician (Orthopedic Surgery)  Extended Emergency Contact Information Primary Emergency Contact: Katherine Delgado Address: [address]          [place], [place] [address] [place] Home Phone: [phone] Relation: Son Secondary Emergency Contact: Katherine Delgado, [place] Phone: [phone] Relation: Son  Code Status:  Full Code  Goals of care: Advanced Directive information    04/02/2024    9:14 AM  Advanced Directives  Does Patient Have a Medical Advance Directive? Yes  Type of Advance Directive Out of facility DNR (pink MOST or yellow form)  Does patient want to make changes to medical advance directive? No - Patient declined     Chief Complaint  Patient presents with   Edema    Lower extreme edema.    Discussed the use of AI scribe software for clinical note transcription with the patient, who gave verbal consent to proceed.  History of Present Illness   Katherine Delgado is an 83 year old female who presents with leg swelling. She is accompanied by her son.  She has experienced significant improvement in her leg swelling since the last visit. She was initially prescribed furosemide  at a dose of 40 mg and doxycycline  for cellulitis. Her weight has decreased from 234 pounds to 210 pounds, indicating a reduction in fluid retention. However, she still experiences some residual swelling, particularly in her legs.  She has a history of diabetes and reports a persistent yeast infection affecting her groin and under her breasts. She has been using nystatin  powder but requires more due to insufficient supply. She also mentions a sore on her buttocks,  which she attributes to prolonged sitting and moisture. She uses Desitin cream to manage this.  Her current medications include gabapentin , levothyroxine , furosemide , potassium chloride , magnesium  oxide, Duoneb solution, oxycodone , amlodipine , semaglutide , metoprolol , lisinopril , Toujeo , Dexcom, tizanidine , Diflucan , Protonix , hydrochlorothiazide , Humalog , ketoconazole  cream, foticonazole cream, Flonase , cranberry tablets, folic acid , amitriptyline , duloxetine , Zetia , Spiriva , Ocean Nasospirin, multivitamin, vitamin D , melatonin, aspirin, omega-3 fatty acids, and Phenergan . She is not taking vitamin E.  She monitors her blood pressure and glucose levels at home. She reports a low blood glucose reading earlier in the day but it improved to 99 after eating. She is not currently taking the prescribed 50,000 units of vitamin D  due to insurance issues and instead takes 2,000 to 3,000 units daily over the counter.  She experiences back pain, which she attributes to previous back surgery. She also reports feeling weak and cold frequently, and has concerns about her thyroid  function, noting symptoms such as hair loss and fatigue.    Past Medical History:  Diagnosis Date   Abnormality of gait 04/19/2016   Acute infective polyneuritis (HCC) 2002   Allergic rhinitis due to pollen    Chronic pain syndrome    COPD (chronic obstructive pulmonary disease) (HCC)    chronic bronchitis   Cystitis    Depressive disorder, not elsewhere classified    Diabetes mellitus without complication (HCC)    Diaphragmatic hernia without mention of obstruction or gangrene    Dyslipidemia    Fibromyalgia    GERD (gastroesophageal reflux disease)    with h/o esophagitis   Guillain-Barre syndrome (  HCC)    History of benign thymus tumor    Insomnia, unspecified    Lumbar spinal stenosis 03/30/2018   L4-5 level, severe   Miscarriage 1962   Mixed hyperlipidemia    Morbid obesity (HCC)    Osteoporosis    Pneumonia     Polyneuropathy in diabetes(357.2)    Rheumatoid arthritis(714.0)    Spondylosis, lumbosacral    Spontaneous ecchymoses    Type II or unspecified type diabetes mellitus with peripheral circulatory disorders, uncontrolled(250.72)    Unspecified chronic bronchitis (HCC)    Unspecified essential hypertension    Unspecified hypothyroidism    Unspecified pruritic disorder    Unspecified urinary incontinence    Past Surgical History:  Procedure Laterality Date   ABDOMINAL HYSTERECTOMY  1974   abdominal tumor  2002   APPENDECTOMY     APPLICATION OF A-CELL OF BACK N/A 09/26/2018   Procedure: With Acell;  Surgeon: Katherine Estefana RAMAN, DO;  Location: [facility] OR;  Service: Plastics;  Laterality: N/A;   APPLICATION OF WOUND VAC N/A 09/26/2018   Procedure: Vac placement;  Surgeon: Katherine Estefana RAMAN, DO;  Location: [facility] OR;  Service: Plastics;  Laterality: N/A;   BACK SURGERY  1982   BRONCHOSCOPY  2001   CATARACT EXTRACTION, BILATERAL  09/2019, 10/2019   CHOLECYSTECTOMY  1984   INCISION AND DRAINAGE OF WOUND N/A 09/26/2018   Procedure: Debridement of spine wound;  Surgeon: Katherine Estefana RAMAN, DO;  Location: [facility] OR;  Service: Plastics;  Laterality: N/A;   KNEE SURGERY Bilateral 08/27/2010 (L) and 01/11/2011 (R)   LUMBAR LAMINECTOMY/DECOMPRESSION MICRODISCECTOMY N/A 07/03/2018   Procedure: Lumbar Three to Lumbar Five Laminectomy;  Surgeon: Katherine Debby LABOR, MD;  Location: [facility] OR;  Service: Neurosurgery;  Laterality: N/A;   LUMBAR WOUND DEBRIDEMENT N/A 08/20/2018   Procedure: POSTERIOR LUMBAR SPINAL WOUND DEBRIDEMENT AND REVISION;  Surgeon: Katherine Debby LABOR, MD;  Location: [facility] OR;  Service: Neurosurgery;  Laterality: N/A;  POSTERIOR LUMBAR SPINAL WOUND REVISION   miscarrage  1962   OVARIAN CYST SURGERY  1968   thymus tumor     thymus tumor  10/2000   TONSILLECTOMY      Allergies  Allergen Reactions   Influenza Vaccines Other (See Comments)    h/o Guillain Barre; dr's told me years ago never to take  another flu shot as it could relapse Katherine Delgado; has to do with when vaccine being changed to H1N1 virus   Penicillins Hives, Itching, Swelling and Rash    Has patient had a PCN reaction causing immediate rash, facial/tongue/throat swelling, SOB or lightheadedness with hypotension:Unknown Has patient had a PCN reaction causing severe rash involving mucus membranes or skin necrosis: Unknown Has patient had a PCN reaction that required hospitalization:Unknown Has patient had a PCN reaction occurring within the last 10 years: Unknown If all of the above answers are NO, then may proceed with Cephalosporin use.    Statins Other (See Comments)    Myopathy, transaminitis   Sulfamethoxazole -Trimethoprim  Itching    Allergies as of 04/17/2024       Reactions   Influenza Vaccines Other (See Comments)   h/o Guillain Barre; dr's told me years ago never to take another flu shot as it could relapse Katherine Delgado; has to do with when vaccine being changed to H1N1 virus   Penicillins Hives, Itching, Swelling, Rash   Has patient had a PCN reaction causing immediate rash, facial/tongue/throat swelling, SOB or lightheadedness with hypotension:Unknown Has patient had a PCN reaction causing severe rash involving  mucus membranes or skin necrosis: Unknown Has patient had a PCN reaction that required hospitalization:Unknown Has patient had a PCN reaction occurring within the last 10 years: Unknown If all of the above answers are NO, then may proceed with Cephalosporin use.   Statins Other (See Comments)   Myopathy, transaminitis   Sulfamethoxazole -trimethoprim  Itching        Medication List        Accurate as of April 17, 2024 11:59 PM. If you have any questions, ask your nurse or doctor.          STOP taking these medications    Vitamin D  (Ergocalciferol ) 1.25 MG (50000 UNIT) Caps capsule Commonly known as: DRISDOL  Stopped by: Birdia Jaycox C Greene Diodato       TAKE these medications     amitriptyline  50 MG tablet Commonly known as: ELAVIL  Take 1 tablet (50 mg total) by mouth at bedtime.   amLODipine  10 MG tablet Commonly known as: NORVASC  TAKE 1 TABLET(10 MG) BY MOUTH DAILY   artificial tears ophthalmic solution Place 1 drop into both eyes 4 (four) times daily as needed (for dry/irritated eyes).   aspirin EC 81 MG tablet Take 81 mg by mouth 2 (two) times a week.   augmented betamethasone  dipropionate 0.05 % cream Commonly known as: DIPROLENE -AF Apply topically 2 (two) times daily as needed.   B-D UF III MINI PEN NEEDLES 31G X 5 MM Misc Generic drug: Insulin  Pen Needle 1 Device by Other route in the morning, at noon, in the evening, and at bedtime.   cholecalciferol 25 MCG (1000 UNIT) tablet Commonly known as: VITAMIN D3 Take 2,000 Units by mouth daily.   Cranberry 475 MG Caps Take 1 capsule (475 mg total) by mouth 2 (two) times daily.   Dexcom G7 Sensor Misc 1 Device by Does not apply route as directed.   docusate sodium  100 MG capsule Commonly known as: COLACE Take 100 mg by mouth daily as needed for mild constipation.   DULoxetine  20 MG capsule Commonly known as: CYMBALTA  TAKE 1 CAPSULE(20 MG) BY MOUTH DAILY   ezetimibe  10 MG tablet Commonly known as: ZETIA  Take 1 tablet (10 mg total) by mouth daily.   fluconazole  150 MG tablet Commonly known as: DIFLUCAN  Repeat in 72 hours   fluticasone  0.05 % cream Commonly known as: CUTIVATE  Apply topically 2 (two) times daily as needed.   fluticasone  50 MCG/ACT nasal spray Commonly known as: FLONASE  SHAKE LIQUID AND USE 2 SPRAYS IN EACH NOSTRIL AT BEDTIME   folic acid  1 MG tablet Commonly known as: FOLVITE  Take 2 tablets (2 mg total) by mouth daily.   furosemide  40 MG tablet Commonly known as: LASIX  Take 1 tablet (40 mg total) by mouth daily.   gabapentin  600 MG tablet Commonly known as: NEURONTIN  TAKE 1 TABLET(600 MG) BY MOUTH THREE TIMES DAILY   HumaLOG  KwikPen 200 UNIT/ML  KwikPen Generic drug: insulin  lispro Max daily 30 units   ICY HOT MEDICATED SPRAY EX Apply 1 application topically as needed (shoulder pain).   Iodosorb 0.9 % gel Generic drug: cadexomer iodine  Apply 1 Application topically daily as needed for wound care.   ipratropium-albuterol  0.5-2.5 (3) MG/3ML Soln Commonly known as: DUONEB Take 3 mLs by nebulization every 6 (six) hours as needed.   ketoconazole  2 % cream Commonly known as: NIZORAL  Apply 1 Application topically 2 (two) times daily.   levothyroxine  100 MCG tablet Commonly known as: SYNTHROID  Take 1 tablet (100 mcg total) by mouth daily before breakfast.  lisinopril  20 MG tablet Commonly known as: ZESTRIL  TAKE 1 TABLET BY MOUTH EVERY DAY   loperamide 2 MG tablet Commonly known as: IMODIUM A-D Take 2 mg by mouth 4 (four) times daily as needed for diarrhea or loose stools.   magnesium  oxide 400 MG tablet Commonly known as: MAG-OX Take 1 tablet (400 mg total) by mouth daily.   Melatonin 10 MG Tabs Take 10 mg by mouth at bedtime as needed (sleep).   metoprolol  tartrate 50 MG tablet Commonly known as: LOPRESSOR  TAKE 1 TABLET BY MOUTH TWICE DAILY   mirabegron  ER 50 MG Tb24 tablet Commonly known as: Myrbetriq  Take 1 tablet (50 mg total) by mouth daily.   MULTIVITAMIN ADULT PO Take 1 tablet by mouth daily.   nystatin  cream Commonly known as: MYCOSTATIN  Apply 1 Application topically 3 (three) times daily. To rashes under bilateral breasts and bilateral groin   nystatin  powder Commonly known as: MYCOSTATIN /NYSTOP  Apply 1 Application topically 3 (three) times daily.   omega-3 acid ethyl esters 1 g capsule Commonly known as: LOVAZA  TAKE ONE CAPSULE BY MOUTH EVERY DAY   ondansetron  4 MG tablet Commonly known as: Zofran  Take 1 tablet (4 mg total) by mouth every 8 (eight) hours as needed for nausea or vomiting.   oxyCODONE -acetaminophen  10-325 MG tablet Commonly known as: Percocet Take 1 tablet by mouth every 4  (four) hours as needed for pain.   pantoprazole  40 MG tablet Commonly known as: PROTONIX  Take 1 tablet (40 mg total) by mouth daily.   potassium chloride  SA 20 MEQ tablet Commonly known as: KLOR-CON  M Take 2 tablets (40 mEq total) by mouth daily.   promethazine  12.5 MG tablet Commonly known as: PHENERGAN  as needed.   Semaglutide  (2 MG/DOSE) 8 MG/3ML Sopn Inject 2 mg as directed once a week.   sodium chloride  0.65 % nasal spray Commonly known as: OCEAN Place 2 sprays into the nose as needed for congestion.   tiotropium 18 MCG inhalation capsule Commonly known as: SPIRIVA  Place 18 mcg into inhaler and inhale as needed.   tiZANidine  2 MG tablet Commonly known as: ZANAFLEX  TAKE 1 TABLET(2 MG) BY MOUTH THREE TIMES DAILY   Toujeo  SoloStar 300 UNIT/ML Solostar Pen Generic drug: insulin  glargine (1 Unit Dial) 36 units QAM and 36 units  QPM   triamterene -hydrochlorothiazide  37.5-25 MG tablet Commonly known as: MAXZIDE -25 TAKE 1 TABLET BY MOUTH DAILY   venlafaxine  XR 75 MG 24 hr capsule Commonly known as: EFFEXOR -XR TAKE 1 CAPSULE BY MOUTH EVERY DAY WITH BREAKFAST   Xeljanz  5 MG Tabs Generic drug: Tofacitinib  Citrate TAKE 1 TABLET BY MOUTH DAILY        Review of Systems  Constitutional:  Negative for appetite change, chills, fatigue, fever and unexpected weight change.  HENT:  Negative for congestion, dental problem, ear discharge, ear pain, facial swelling, hearing loss, nosebleeds, postnasal drip, rhinorrhea, sinus pressure, sinus pain, sneezing, sore throat, tinnitus and trouble swallowing.   Eyes:  Negative for pain, discharge, redness, itching and visual disturbance.  Respiratory:  Negative for cough, chest tightness, shortness of breath and wheezing.   Cardiovascular:  Negative for chest pain, palpitations and leg swelling.  Gastrointestinal:  Negative for abdominal distention, abdominal pain, constipation, diarrhea, nausea and vomiting.  Endocrine: Negative for  cold intolerance, heat intolerance, polydipsia, polyphagia and polyuria.  Genitourinary:  Negative for difficulty urinating, dysuria, flank pain, frequency and urgency.  Musculoskeletal:  Positive for arthralgias, back pain and gait problem. Negative for joint swelling, myalgias, neck pain and neck  stiffness.  Skin:  Positive for rash. Negative for color change, pallor and wound.       Redness on groin and breast fold   Neurological:  Negative for dizziness, syncope, speech difficulty, weakness, light-headedness, numbness and headaches.  Hematological:  Does not bruise/bleed easily.  Psychiatric/Behavioral:  Negative for agitation, behavioral problems, confusion, hallucinations and sleep disturbance. The patient is not nervous/anxious.     Immunization History  Administered Date(s) Administered   Influenza-Unspecified 06/10/2011   PFIZER(Purple Top)SARS-COV-2 Vaccination 11/13/2019, 11/30/2019   Pneumococcal Conjugate-13 07/07/2016   Pneumococcal Polysaccharide-23 11/21/2003, 10/05/2017   Tdap 05/28/2016   Pertinent  Health Maintenance Due  Topic Date Due   FOOT EXAM  03/07/2024   OPHTHALMOLOGY EXAM  06/11/2024   HEMOGLOBIN A1C  10/03/2024   DEXA SCAN  09/20/2025   INFLUENZA VACCINE  Discontinued      09/12/2023   10:25 AM 11/07/2023   10:47 AM 12/28/2023   10:47 AM 03/01/2024    8:56 AM 03/28/2024    9:25 AM  Fall Risk  Falls in the past year? 0 0 0 0 0  Was there an injury with Fall?    0 0  Fall Risk Category Calculator    0 0  Patient at Risk for Falls Due to   Impaired balance/gait     Functional Status Survey:    Vitals:   04/17/24 1004  BP: 138/76  Pulse: 69  Resp: 19  Temp: 97.8 F (36.6 C)  SpO2: 95%  Weight: 210 lb 9.6 oz (95.5 kg)  Height: 5' 6 (1.676 m)   Body mass index is 33.99 kg/m. Physical Exam  VITALS: T- 97.8, P- 69, BP- 138/76, SaO2- 95% MEASUREMENTS: Weight- 210. GENERAL: Alert, cooperative, well developed, no acute distress. HEENT:  Normocephalic, normal oropharynx, moist mucous membranes. CHEST: Clear to auscultation bilaterally, no wheezes, rhonchi, or crackles. CARDIOVASCULAR: Normal heart rate and rhythm, S1 and S2 normal without murmurs. ABDOMEN: Soft, non-tender, non-distended, without organomegaly, normal bowel sounds. EXTREMITIES: Legs improved, less swelling, no cyanosis or edema. MUSCULOSKELETAL: Back pain noted. NEUROLOGICAL: Cranial nerves grossly intact, moves all extremities without gross motor or sensory deficit. SKIN: Small round lunstageable ulcer on right buttock, dry, no drainage, no redness.     Labs reviewed: Recent Labs    06/27/23 0901 11/09/23 1530 04/11/24 0915  NA 144 139 141  K 3.9 4.5 4.2  CL 104 102 103  CO2 32 28 27  GLUCOSE 46* 126* 71  BUN 12 23 22   CREATININE 0.76 1.17* 1.01*  CALCIUM  9.4 9.2 9.4   Recent Labs    06/27/23 0901 11/09/23 1530 04/11/24 0915  AST 30 25 12   ALT 32* 19 9  BILITOT 0.3 0.4 0.3  PROT 6.3 6.8 6.9  7.1   Recent Labs    06/27/23 0901 11/09/23 1530 04/11/24 0915  WBC 14.6* 14.5* 13.7*  NEUTROABS 10,921* 10,962* 9,206*  HGB 14.0 14.0 14.4  HCT 44.3 43.4 46.1*  MCV 86.9 85.4 88.7  PLT 331 331 437*   Lab Results  Component Value Date   TSH 2.37 04/02/2024   Lab Results  Component Value Date   HGBA1C 7.1 (A) 04/02/2024   Lab Results  Component Value Date   CHOL 147 11/09/2023   HDL 40 (L) 11/09/2023   LDLCALC 77 11/09/2023   TRIG 206 (H) 11/09/2023   CHOLHDL 3.7 11/09/2023    Significant Diagnostic Results in last 30 days:  No results found.  Assessment/Plan  Lower extremity edema and right lower  extremity cellulitis Lower extremity edema and right lower extremity cellulitis are improving. Edema has significantly reduced, and cellulitis is resolving. Significant weight loss indicates fluid loss, but residual edema persists. - Continue furosemide  40 mg daily until edema is controlled - Monitor weight at home for abrupt weight  gain - Continue potassium supplementation with furosemide  - Reassess if symptoms worsen or do not improve  Candidal intertrigo (groin and inframammary) Candidal intertrigo persists in the groin and inframammary areas. Moisture and diabetes are contributing factors. - Refill nystatin  powder - Prescribe fluconazole  with instructions to take one dose and repeat in one week - Advise on moisture control strategies, including using absorbent materials in skin folds  Pressure ulcer of right buttock Ulcer is healing. Likely due to prolonged sitting and moisture. Ulcer is small, round, and dry with no drainage or redness. - Apply Desitin with zinc  to the ulcer - Use a donut cushion to relieve pressure - Encourage frequent position changes and standing to improve circulation  Type 2 diabetes mellitus on insulin  and GLP-1 agonist Type 2 diabetes is managed with insulin  and a GLP-1 agonist (semaglutide ). Recent glucose level was 99 mg/dL, indicating good control. - Continue current diabetes management regimen - Coordinate diabetes care with endocrinologist  Essential hypertension Hypertension is well-controlled with current medication regimen. Recent blood pressure reading was 138/76 mmHg. - Continue current antihypertensive medications  Hypothyroidism Hypothyroidism is managed with levothyroxine . Symptoms suggestive of inadequate control, such as fatigue and feeling cold, are reported. Endocrinologist monitors thyroid  function. - Discuss thyroid  symptoms with endocrinologist at next visit - Consider additional thyroid  function tests if symptoms persist  Chronic pain, post-back surgery Chronic pain persists following back surgery, likely related to previous surgery. - Encourage mobility as tolerated   Family/ staff Communication: Reviewed plan of care with patient verbalized understanding   Labs/tests ordered: - CBC/diff - CMP with GFR - Lipid panel   Next Appointment : Return in about 6  months (around 10/18/2024) for medical mangement of chronic issues., Fasting labs in 6 months prior to visit.   Spent 30 minutes of Face to face and non-face to face with patient  >50% time spent counseling; reviewing medical record; tests; labs; documentation and developing future plan of care.   Katherine JAYSON Plough, NP

## 2024-04-23 ENCOUNTER — Other Ambulatory Visit: Payer: Self-pay | Admitting: Rheumatology

## 2024-04-23 DIAGNOSIS — M0579 Rheumatoid arthritis with rheumatoid factor of multiple sites without organ or systems involvement: Secondary | ICD-10-CM

## 2024-04-23 DIAGNOSIS — Z79899 Other long term (current) drug therapy: Secondary | ICD-10-CM

## 2024-04-23 NOTE — Telephone Encounter (Signed)
 Last Fill: 12/29/2023  Labs: 04/11/2024 CBC stable.  Creatinine is borderline elevated at 1.01 and GFR is 55.  Avoid the use of NSAIDs.  Rest of CMP within normal limits. Ro antibody remains positive consistent with Sjogren syndrome. SPEP did not to reveal any abnormal protein bands.  ANA is negative and La antibody negative.  Complements within normal limits.  11/09/2023 Lipid Panel  HDL 40 Triglycerides 206  TB Gold: 04/11/2024 TB gold negative   Next Visit: 09/13/2024  Last Visit: 04/11/2024  IK:Myzlfjunpi arthritis involving multiple sites with positive rheumatoid factor (HCC)   Current Dose per office note 04/11/2024: Xeljanz  5 mg 1 tablet by mouth daily   Okay to refill Xeljanz ?

## 2024-04-26 ENCOUNTER — Encounter: Attending: Physical Medicine and Rehabilitation | Admitting: Registered Nurse

## 2024-04-26 VITALS — BP 103/65 | HR 68 | Ht 66.0 in | Wt 210.0 lb

## 2024-04-26 DIAGNOSIS — M2550 Pain in unspecified joint: Secondary | ICD-10-CM | POA: Diagnosis not present

## 2024-04-26 DIAGNOSIS — M170 Bilateral primary osteoarthritis of knee: Secondary | ICD-10-CM | POA: Diagnosis not present

## 2024-04-26 DIAGNOSIS — Z5181 Encounter for therapeutic drug level monitoring: Secondary | ICD-10-CM | POA: Diagnosis not present

## 2024-04-26 DIAGNOSIS — M25511 Pain in right shoulder: Secondary | ICD-10-CM | POA: Diagnosis not present

## 2024-04-26 DIAGNOSIS — G8929 Other chronic pain: Secondary | ICD-10-CM | POA: Insufficient documentation

## 2024-04-26 DIAGNOSIS — M5412 Radiculopathy, cervical region: Secondary | ICD-10-CM | POA: Insufficient documentation

## 2024-04-26 DIAGNOSIS — G6289 Other specified polyneuropathies: Secondary | ICD-10-CM | POA: Diagnosis not present

## 2024-04-26 DIAGNOSIS — M961 Postlaminectomy syndrome, not elsewhere classified: Secondary | ICD-10-CM | POA: Insufficient documentation

## 2024-04-26 DIAGNOSIS — M25512 Pain in left shoulder: Secondary | ICD-10-CM | POA: Diagnosis not present

## 2024-04-26 DIAGNOSIS — M542 Cervicalgia: Secondary | ICD-10-CM | POA: Diagnosis not present

## 2024-04-26 DIAGNOSIS — M5416 Radiculopathy, lumbar region: Secondary | ICD-10-CM | POA: Insufficient documentation

## 2024-04-26 DIAGNOSIS — M7062 Trochanteric bursitis, left hip: Secondary | ICD-10-CM | POA: Diagnosis not present

## 2024-04-26 DIAGNOSIS — M7061 Trochanteric bursitis, right hip: Secondary | ICD-10-CM | POA: Insufficient documentation

## 2024-04-26 DIAGNOSIS — G894 Chronic pain syndrome: Secondary | ICD-10-CM | POA: Insufficient documentation

## 2024-04-26 MED ORDER — OXYCODONE-ACETAMINOPHEN 10-325 MG PO TABS: 1.0000 | ORAL_TABLET | ORAL | 0 refills | Status: DC | PRN

## 2024-04-26 NOTE — Progress Notes (Signed)
 Subjective:    Patient ID: Katherine Delgado, female    DOB: [birth year], 83 y.o.   MRN: [id number]  HPI: Katherine Delgado is a 83 y.o. female who returns for follow up appointment for chronic pain and medication refill. She states her pain is located in her neck radiating into her left shoulder and left arm, lower back pain radiating into her bilateral hips and bilateral knee pain. She also reports bilateral feet with tingling and burning. She rates her pain 10.  He current  exercise regime is walking and performing stretching exercises.  [name] Morphine  equivalent is 90.00 MME.   Last Oral Swab was Performed on 12/28/2023, it was consistent.     Pain Inventory Average Pain 9 Pain Right Now 10 My pain is sharp, burning, dull, stabbing, tingling, and aching  In the last 24 hours, has pain interfered with the following? General activity 10 Relation with others 10 Enjoyment of life 10 What TIME of day is your pain at its worst? morning , evening, and night Sleep (in general) Poor  Pain is worse with: walking, bending, sitting, inactivity, standing, and some activites Pain improves with: rest, pacing activities, and medication Relief from Meds: 5  Family History  Problem Relation Age of Onset   Alzheimer's disease Mother    Heart disease Mother    Heart disease Father    Liver disease Father    Cancer Brother    Colon polyps Brother    Arthritis Son    Colon cancer Neg Hx    Esophageal cancer Neg Hx    Kidney disease Neg Hx    Stomach cancer Neg Hx    Rectal cancer Neg Hx    Social History   Socioeconomic History   Marital status: Widowed    Spouse name: Not on file   Number of children: 2   Years of education: 14   Highest education level: Not on file  Occupational History   Occupation: Retired  Tobacco Use   Smoking status: Former    Current packs/day: 0.00    Average packs/day: 0.1 packs/day for 10.0 years (1.0 ttl pk-yrs)    Types: Cigarettes    Start date:  12/06/1969    Quit date: 12/07/1979    Years since quitting: 44.4    Passive exposure: Current   Smokeless tobacco: Never  Vaping Use   Vaping status: Never Used  Substance and Sexual Activity   Alcohol  use: No    Alcohol /week: 0.0 standard drinks of alcohol    Drug use: No   Sexual activity: Not Currently  Other Topics Concern   Not on file  Social History Narrative   Walks with cane   Right handed    Caffeine use: Coffee (2 cups every morning)   Tea: sometimes   Soda: none   Social Drivers of Corporate investment banker Strain: Medium Risk (10/05/2017)   Overall Financial Resource Strain (CARDIA)    Difficulty of Paying Living Expenses: Somewhat hard  Food Insecurity: No Food Insecurity (10/25/2022)   Hunger Vital Sign    Worried About Running Out of Food in the Last Year: Never true    Ran Out of Food in the Last Year: Never true  Transportation Needs: No Transportation Needs (10/25/2022)   PRAPARE - Administrator, Civil Service (Medical): No    Lack of Transportation (Non-Medical): No  Physical Activity: Inactive (10/05/2017)   Exercise Vital Sign    Days of Exercise per Week:  0 days    Minutes of Exercise per Session: 0 min  Stress: Stress Concern Present (10/05/2017)   [employer] of Occupational Health - Occupational Stress Questionnaire    Feeling of Stress : Rather much  Social Connections: Moderately Isolated (10/05/2017)   Social Connection and Isolation Panel    Frequency of Communication with Friends and Family: More than three times a week    Frequency of Social Gatherings with Friends and Family: More than three times a week    Attends Religious Services: Never    Database administrator or Organizations: No    Attends Banker Meetings: Never    Marital Status: Widowed   Past Surgical History:  Procedure Laterality Date   ABDOMINAL HYSTERECTOMY  1974   abdominal tumor  2002   APPENDECTOMY     APPLICATION OF A-CELL OF BACK N/A  09/26/2018   Procedure: With Acell;  Surgeon: Lowery Estefana RAMAN, DO;  Location: [facility];  Service: Plastics;  Laterality: N/A;   APPLICATION OF WOUND VAC N/A 09/26/2018   Procedure: Vac placement;  Surgeon: Lowery Estefana RAMAN, DO;  Location: [facility];  Service: Plastics;  Laterality: N/A;   BACK SURGERY  1982   BRONCHOSCOPY  2001   CATARACT EXTRACTION, BILATERAL  09/2019, 10/2019   CHOLECYSTECTOMY  1984   INCISION AND DRAINAGE OF WOUND N/A 09/26/2018   Procedure: Debridement of spine wound;  Surgeon: Lowery Estefana RAMAN, DO;  Location: [facility];  Service: Plastics;  Laterality: N/A;   KNEE SURGERY Bilateral 08/27/2010 (L) and 01/11/2011 (R)   LUMBAR LAMINECTOMY/DECOMPRESSION MICRODISCECTOMY N/A 07/03/2018   Procedure: Lumbar Three to Lumbar Five Laminectomy;  Surgeon: Cheryle Debby LABOR, MD;  Location: [facility];  Service: Neurosurgery;  Laterality: N/A;   LUMBAR WOUND DEBRIDEMENT N/A 08/20/2018   Procedure: POSTERIOR LUMBAR SPINAL WOUND DEBRIDEMENT AND REVISION;  Surgeon: Cheryle Debby LABOR, MD;  [facility]: [facility];  Service: Neurosurgery;  Laterality: N/A;  POSTERIOR LUMBAR SPINAL WOUND REVISION   miscarrage  1962   OVARIAN CYST SURGERY  1968   thymus tumor     thymus tumor  10/2000   TONSILLECTOMY     Past Surgical History:  Procedure Laterality Date   ABDOMINAL HYSTERECTOMY  1974   abdominal tumor  2002   APPENDECTOMY     APPLICATION OF A-CELL OF BACK N/A 09/26/2018   Procedure: With Acell;  Surgeon: Lowery Estefana RAMAN, DO;  [facility];  Service: Plastics;  Laterality: N/A;   APPLICATION OF WOUND VAC N/A 09/26/2018   Procedure: Vac placement;  Surgeon: Lowery Estefana RAMAN, DO;  [facility];  Service: Plastics;  Laterality: N/A;   BACK SURGERY  1982   BRONCHOSCOPY  2001   CATARACT EXTRACTION, BILATERAL  09/2019, 10/2019   CHOLECYSTECTOMY  1984   INCISION AND DRAINAGE OF WOUND N/A 09/26/2018   Procedure: Debridement of spine wound;  Surgeon: Lowery Estefana RAMAN, DO;  [facility];   Service: Plastics;  Laterality: N/A;   KNEE SURGERY Bilateral 08/27/2010 (L) and 01/11/2011 (R)   LUMBAR LAMINECTOMY/DECOMPRESSION MICRODISCECTOMY N/A 07/03/2018   Procedure: Lumbar Three to Lumbar Five Laminectomy;  Surgeon: Cheryle Debby LABOR, MD;  [facility];  Service: Neurosurgery;  Laterality: N/A;   LUMBAR WOUND DEBRIDEMENT N/A 08/20/2018   Procedure: POSTERIOR LUMBAR SPINAL WOUND DEBRIDEMENT AND REVISION;  Surgeon: Cheryle Debby LABOR, MD;  [facility];  Service: Neurosurgery;  Laterality: N/A;  POSTERIOR LUMBAR SPINAL WOUND REVISION   miscarrage  1962   OVARIAN CYST  SURGERY  1968   thymus tumor     thymus tumor  10/2000   TONSILLECTOMY     Past Medical History:  Diagnosis Date   Abnormality of gait 04/19/2016   Acute infective polyneuritis (HCC) 2002   Allergic rhinitis due to pollen    Chronic pain syndrome    COPD (chronic obstructive pulmonary disease) (HCC)    chronic bronchitis   Cystitis    Depressive disorder, not elsewhere classified    Diabetes mellitus without complication (HCC)    Diaphragmatic hernia without mention of obstruction or gangrene    Dyslipidemia    Fibromyalgia    GERD (gastroesophageal reflux disease)    with h/o esophagitis   Guillain-Barre syndrome (HCC)    History of benign thymus tumor    Insomnia, unspecified    Lumbar spinal stenosis 03/30/2018   L4-5 level, severe   Miscarriage 1962   Mixed hyperlipidemia    Morbid obesity (HCC)    Osteoporosis    Pneumonia    Polyneuropathy in diabetes(357.2)    Rheumatoid arthritis(714.0)    Spondylosis, lumbosacral    Spontaneous ecchymoses    Type II or unspecified type diabetes mellitus with peripheral circulatory disorders, uncontrolled(250.72)    Unspecified chronic bronchitis (HCC)    Unspecified essential hypertension    Unspecified hypothyroidism    Unspecified pruritic disorder    Unspecified urinary incontinence    BP 103/65   Pulse 68   Ht 5' 6 (1.676 m)   Wt 210 lb  (95.3 kg)   SpO2 96%   BMI 33.89 kg/m   Opioid Risk Score:   Fall Risk Score:  `1  Depression screen [facility] 2/9     03/28/2024    9:25 AM 03/01/2024    8:57 AM 12/28/2023   10:47 AM 09/12/2023   10:25 AM 08/11/2023    9:27 AM 07/12/2023   10:59 AM 06/09/2023    9:28 AM  Depression screen PHQ 2/9  Decreased Interest 1 0 0 0 0 0 0  Down, Depressed, Hopeless 1 0 0 0 0 1 0  PHQ - 2 Score 2 0 0 0 0 1 0     Review of Systems  Musculoskeletal:  Positive for back pain and gait problem.       Left arm pain B/L hip pain B/L leg pain  All other systems reviewed and are negative.      Objective:   Physical Exam Vitals and nursing note reviewed.  Constitutional:      Appearance: Normal appearance.  Neck:     Comments: Cervical Paraspinal Tenderness: C-5-C-6 Mainly Left Side  Cardiovascular:     Rate and Rhythm: Normal rate and regular rhythm.     Pulses: Normal pulses.     Heart sounds: Normal heart sounds.  Pulmonary:     Effort: Pulmonary effort is normal.     Breath sounds: Normal breath sounds.  Musculoskeletal:     Comments: Normal Muscle Bulk and Muscle Testing Reveals:  Upper Extremities: Full ROM and Muscle Strength 5/5 Left AC Joint Tenderness  Lumbar Paraspinal Tenderness: L-4-L-5 Bilateral Greater Trochanter Tenderness Lower Extremities: Full ROM and Muscle Strength 5/5 Arrived in wheelchair    Skin:    General: Skin is warm and dry.  Neurological:     Mental Status: She is alert and oriented to person, place, and time.  Psychiatric:        Mood and Affect: Mood normal.        Behavior: Behavior normal.  Assessment & Plan:  1..Failed Back Syndrome: Continue HEP as Tolerated. Continue to Monitor. 04/26/2024 2.Lumbar Radiculitis/ Chronic Low Back Pain without Sciatica: Continue Gabapentin . Continue to monitor. Continue HEP as Tolerated 04/26/2024 3.Bilateral  Greater Trochanter Bursitis: .Continue to Alternate Ice and Heat Therapy. Continue current  medication regimen. Continue to monitor. 04/26/2024 4.Bilateral Primary OA:/ Both Knees  Continue HEP as Tolerated. Continue to Monitor. 04/26/2024 5.Insomnia: Continue Amitriptyline  . Continue to Monitor.04/26/2024 6.Chronic Pain Syndrome: Refilled: Oxycodone  10/325 mg one tablet every 4 hours as needed for pain #180. We will continue the opioid monitoring program, this consists of regular clinic visits, examinations, urine drug screen, pill counts as well as use of [place]  Controlled Substance Reporting system. A 12 month History has been reviewed on the [place]  Controlled Substance Reporting System on 04/26/2024 7. Cervicalgia/ Cervical Radiculitis: Continue Gabapentin . Continue HEP as tolerated. Continue to monitor. 09/05//2025 8. Polyarthralgia: Continue HEP as tolerated. Continue to monitor. 04/26/2024 9. Polyneuropathy: Continue Gabapentin . Continue to Monitor. 04/26/2024  F/U in 1 month

## 2024-05-02 ENCOUNTER — Encounter: Payer: Self-pay | Admitting: Registered Nurse

## 2024-05-05 DIAGNOSIS — E119 Type 2 diabetes mellitus without complications: Secondary | ICD-10-CM | POA: Diagnosis not present

## 2024-05-14 ENCOUNTER — Other Ambulatory Visit: Payer: Self-pay | Admitting: Family

## 2024-05-23 NOTE — Progress Notes (Unsigned)
 Subjective:    Patient ID: Katherine Delgado, female    DOB: [birth year], 83 y.o.   MRN: [id number]  HPI: Katherine Delgado is a 83 y.o. female who returns for follow up appointment for chronic pain and medication refill. She states her  pain is located in her neck radiating into her bilateral shoulders, lower back radiating into her bilateral lower extremities, bilateral hip pain and bilateral knee pain. She also reports bilateral feet pain with tingling and burning. She rates her pain 9. Her current exercise regime is walking with walker short distances and performing stretching exercises.  Ms. Hauck Morphine  equivalent is 90.00 MME.   Oral Swab was Performed today.     Pain Inventory Average Pain 9 Pain Right Now 9 My pain is sharp, burning, dull, stabbing, tingling, and aching  In the last 24 hours, has pain interfered with the following? General activity 10 Relation with others 10 Enjoyment of life 10 What TIME of day is your pain at its worst? morning  and night Sleep (in general) Poor  Pain is worse with: walking, bending, sitting, inactivity, standing, and some activites Pain improves with: medication Relief from Meds: 4  Family History  Problem Relation Age of Onset  . Alzheimer's disease Mother   . Heart disease Mother   . Heart disease Father   . Liver disease Father   . Cancer Brother   . Colon polyps Brother   . Arthritis Son   . Colon cancer Neg Hx   . Esophageal cancer Neg Hx   . Kidney disease Neg Hx   . Stomach cancer Neg Hx   . Rectal cancer Neg Hx    Social History   Socioeconomic History  . Marital status: Widowed    Spouse name: Not on file  . Number of children: 2  . Years of education: 16  . Highest education level: Not on file  Occupational History  . Occupation: Retired  Tobacco Use  . Smoking status: Former    Current packs/day: 0.00    Average packs/day: 0.1 packs/day for 10.0 years (1.0 ttl pk-yrs)    Types: Cigarettes    Start date:  12/06/1969    Quit date: 12/07/1979    Years since quitting: 44.4    Passive exposure: Current  . Smokeless tobacco: Never  Vaping Use  . Vaping status: Never Used  Substance and Sexual Activity  . Alcohol  use: No    Alcohol /week: 0.0 standard drinks of alcohol   . Drug use: No  . Sexual activity: Not Currently  Other Topics Concern  . Not on file  Social History Narrative   Walks with cane   Right handed    Caffeine use: Coffee (2 cups every morning)   Tea: sometimes   Soda: none   Social Drivers of Corporate investment banker Strain: Medium Risk (10/05/2017)   Overall Financial Resource Strain (CARDIA)   . Difficulty of Paying Living Expenses: Somewhat hard  Food Insecurity: No Food Insecurity (10/25/2022)   Hunger Vital Sign   . Worried About Programme researcher, broadcasting/film/video in the Last Year: Never true   . Ran Out of Food in the Last Year: Never true  Transportation Needs: No Transportation Needs (10/25/2022)   PRAPARE - Transportation   . Lack of Transportation (Medical): No   . Lack of Transportation (Non-Medical): No  Physical Activity: Inactive (10/05/2017)   Exercise Vital Sign   . Days of Exercise per Week: 0 days   . Minutes  of Exercise per Session: 0 min  Stress: Stress Concern Present (10/05/2017)   [employer] of Occupational Health - Occupational Stress Questionnaire   . Feeling of Stress : Rather much  Social Connections: Moderately Isolated (10/05/2017)   Social Connection and Isolation Panel   . Frequency of Communication with Friends and Family: More than three times a week   . Frequency of Social Gatherings with Friends and Family: More than three times a week   . Attends Religious Services: Never   . Active Member of Clubs or Organizations: No   . Attends Banker Meetings: Never   . Marital Status: Widowed   Past Surgical History:  Procedure Laterality Date  . ABDOMINAL HYSTERECTOMY  1974  . abdominal tumor  2002  . APPENDECTOMY    . APPLICATION  OF A-CELL OF BACK N/A 09/26/2018   Procedure: With Acell;  Surgeon: Lowery Estefana RAMAN, DO;  Location: [facility];  Service: Plastics;  Laterality: N/A;  . APPLICATION OF WOUND VAC N/A 09/26/2018   Procedure: Vac placement;  Surgeon: Lowery Estefana RAMAN, DO;  Location: [facility];  Service: Plastics;  Laterality: N/A;  . BACK SURGERY  1982  . BRONCHOSCOPY  2001  . CATARACT EXTRACTION, BILATERAL  09/2019, 10/2019  . CHOLECYSTECTOMY  1984  . INCISION AND DRAINAGE OF WOUND N/A 09/26/2018   Procedure: Debridement of spine wound;  Surgeon: Lowery Estefana RAMAN, DO;  Location: [facility];  Service: Plastics;  Laterality: N/A;  . KNEE SURGERY Bilateral 08/27/2010 (L) and 01/11/2011 (R)  . LUMBAR LAMINECTOMY/DECOMPRESSION MICRODISCECTOMY N/A 07/03/2018   Procedure: Lumbar Three to Lumbar Five Laminectomy;  Surgeon: Cheryle Debby LABOR, MD;  Location[facility];  Service: Neurosurgery;  Laterality: N/A;  . LUMBAR WOUND DEBRIDEMENT N/A 08/20/2018   Procedure: POSTERIOR LUMBAR SPINAL WOUND DEBRIDEMENT AND REVISION;  Surgeon: Cheryle Debby LABOR, MD;  [facility];  Service: Neurosurgery;  Laterality: N/A;  POSTERIOR LUMBAR SPINAL WOUND REVISION  . miscarrage  1962  . OVARIAN CYST SURGERY  1968  . thymus tumor    . 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 TONSILLECTOMY     Past Surgical History:  Procedure Laterality Date  . ABDOMINAL HYSTERECTOMY  1974  . abdominal tumor  2002  . APPENDECTOMY    . APPLICATION OF A-CELL OF BACK N/A 09/26/2018   Procedure: With Acell;  Surgeon: Lowery Estefana RAMAN, DO;  [facility];  Service: Plastics;  Laterality: N/A;  . APPLICATION OF WOUND VAC N/A 09/26/2018   Procedure: Vac placement;  Surgeon: Lowery Estefana RAMAN, DO;  [facility]: [facility];  Service: Plastics;  Laterality: N/A;  . BACK SURGERY  1982  . BRONCHOSCOPY  2001  . CATARACT EXTRACTION, BILATERAL  09/2019, 10/2019  . CHOLECYSTECTOMY  1984  . INCISION AND DRAINAGE OF WOUND N/A 09/26/2018   Procedure: Debridement of spine wound;  Surgeon:  Lowery Estefana RAMAN, DO;  [facility]: [facility];  Service: Plastics;  Laterality: N/A;  . KNEE SURGERY Bilateral 08/27/2010 (L) and 01/11/2011 (R)  . LUMBAR LAMINECTOMY/DECOMPRESSION MICRODISCECTOMY N/A 07/03/2018   Procedure: Lumbar Three to Lumbar Five Laminectomy;  Surgeon: Cheryle Debby LABOR, MD;  Location: [facility] OR;  Service: Neurosurgery;  Laterality: N/A;  . LUMBAR WOUND DEBRIDEMENT N/A 08/20/2018   Procedure: POSTERIOR LUMBAR SPINAL WOUND DEBRIDEMENT AND REVISION;  Surgeon: Cheryle Debby LABOR, MD;  [facility]: [facility];  Service: Neurosurgery;  Laterality: N/A;  POSTERIOR LUMBAR SPINAL WOUND REVISION  . miscarrage  1962  . OVARIAN CYST SURGERY  1968  . thymus  tumor    . 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 TONSILLECTOMY     Past Medical History:  Diagnosis Date  . Abnormality of gait 04/19/2016  . Acute infective polyneuritis 2002  . Allergic rhinitis due to pollen   . Chronic pain syndrome   . COPD (chronic obstructive pulmonary disease) (HCC)    chronic bronchitis  . Cystitis   . Depressive disorder, not elsewhere classified   . Diabetes mellitus without complication (HCC)   . Diaphragmatic hernia without mention of obstruction or gangrene   . Dyslipidemia   . Fibromyalgia   . GERD (gastroesophageal reflux disease)    with h/o esophagitis  . Guillain-Barre syndrome   . History of benign thymus tumor   . Insomnia, unspecified   . Lumbar spinal stenosis 03/30/2018   L4-5 level, severe  . Miscarriage 1962  . Mixed hyperlipidemia   . Morbid obesity (HCC)   . Osteoporosis   . Pneumonia   . Polyneuropathy in diabetes(357.2)   . Rheumatoid arthritis(714.0)   . Spondylosis, lumbosacral   . Spontaneous ecchymoses   . Type II or unspecified type diabetes mellitus with peripheral circulatory disorders, uncontrolled(250.72)   . Unspecified chronic bronchitis (HCC)   . Unspecified essential hypertension   . Unspecified hypothyroidism   . Unspecified pruritic disorder   . Unspecified urinary  incontinence    There were no vitals taken for this visit.  Opioid Risk Score:   Fall Risk Score:  `1  Depression screen [facility] 2/9     03/28/2024    9:25 AM 03/01/2024    8:57 AM 12/28/2023   10:47 AM 09/12/2023   10:25 AM 08/11/2023    9:27 AM 07/12/2023   10:59 AM 06/09/2023    9:28 AM  Depression screen PHQ 2/9  Decreased Interest 1 0 0 0 0 0 0  Down, Depressed, Hopeless 1 0 0 0 0 1 0  PHQ - 2 Score 2 0 0 0 0 1 0     Review of Systems  Constitutional: Negative.   HENT: Negative.    Eyes: Negative.   Respiratory: Negative.    Cardiovascular: Negative.   Endocrine: Negative.   Genitourinary: Negative.   Musculoskeletal:  Positive for back pain and neck pain.  Skin: Negative.   Allergic/Immunologic: Negative.   Neurological: Negative.   Hematological: Negative.   Psychiatric/Behavioral: Negative.         Objective:   Physical Exam Vitals and nursing note reviewed.  Constitutional:      Appearance: Normal appearance. She is obese.  Neck:     Comments: Cervical Paraspinal Tenderness: C-5-C-6 Cardiovascular:     Rate and Rhythm: Normal rate and regular rhythm.     Pulses: Normal pulses.     Heart sounds: Normal heart sounds.  Pulmonary:     Effort: Pulmonary effort is normal.     Breath sounds: Normal breath sounds.  Musculoskeletal:     Comments: Normal Muscle Bulk and Muscle Testing Reveals:  Upper Extremities:Right: Full ROM and Muscle Strength 5/5 Left Upper extremity: Decreased ROM 45 Degrees and  ROM and Muscle Strength 5/5  Lumbar Paraspinal Tenderness: L-3-L-5 Bilateral Greater Trochanter Tenderness Lower Extremities: Full ROM and Muscle Strength 5/5 Bilateral Lower Extremity Flexion Produces Pain into her Bilateral Patella's Arrived in wheelchair  Gait     Skin:    General: Skin is warm and dry.  Neurological:     Mental Status: She is oriented to person, place, and time.  Psychiatric:  Mood and Affect: Mood normal.        Behavior: Behavior  normal.          Assessment & Plan:  1..Failed Back Syndrome: Continue HEP as Tolerated. Continue to Monitor. 05/24/2024 2.Lumbar Radiculitis/ Chronic Low Back Pain without Sciatica: Continue Gabapentin . Continue to monitor. Continue HEP as Tolerated 05/24/2024 3.Bilateral  Greater Trochanter Bursitis: .Continue to Alternate Ice and Heat Therapy. Continue current medication regimen. Continue to monitor. 05/24/2024 4.Bilateral Primary OA:/ Both Knees  Continue HEP as Tolerated. Continue to Monitor. 05/24/2024 5.Insomnia: Continue Amitriptyline  . Continue to Monitor.05/24/2024 6.Chronic Pain Syndrome: Refilled: Oxycodone  10/325 mg one tablet every 4 hours as needed for pain #180. We will continue the opioid monitoring program, this consists of regular clinic visits, examinations, urine drug screen, pill counts as well as use of [place]  Controlled Substance Reporting system. A 12 month History has been reviewed on the   Controlled Substance Reporting System on 05/24/2024 7. Cervicalgia/ Cervical Radiculitis: Continue Gabapentin . Continue HEP as tolerated. Continue to monitor. 10/03//2025 8. Polyarthralgia: Continue HEP as tolerated. Continue to monitor. 05/24/2024 9. Polyneuropathy: Continue Gabapentin . Continue to Monitor. 05/24/2024   F/U in 1 month

## 2024-05-24 ENCOUNTER — Encounter: Payer: Self-pay | Admitting: Registered Nurse

## 2024-05-24 ENCOUNTER — Encounter: Attending: Physical Medicine and Rehabilitation | Admitting: Registered Nurse

## 2024-05-24 VITALS — BP 119/68 | HR 60 | Ht 66.0 in | Wt 212.6 lb

## 2024-05-24 DIAGNOSIS — Z5181 Encounter for therapeutic drug level monitoring: Secondary | ICD-10-CM | POA: Insufficient documentation

## 2024-05-24 DIAGNOSIS — Z79891 Long term (current) use of opiate analgesic: Secondary | ICD-10-CM | POA: Insufficient documentation

## 2024-05-24 DIAGNOSIS — M170 Bilateral primary osteoarthritis of knee: Secondary | ICD-10-CM | POA: Insufficient documentation

## 2024-05-24 DIAGNOSIS — M25512 Pain in left shoulder: Secondary | ICD-10-CM | POA: Insufficient documentation

## 2024-05-24 DIAGNOSIS — M7062 Trochanteric bursitis, left hip: Secondary | ICD-10-CM | POA: Insufficient documentation

## 2024-05-24 DIAGNOSIS — M961 Postlaminectomy syndrome, not elsewhere classified: Secondary | ICD-10-CM | POA: Diagnosis not present

## 2024-05-24 DIAGNOSIS — G6289 Other specified polyneuropathies: Secondary | ICD-10-CM | POA: Insufficient documentation

## 2024-05-24 DIAGNOSIS — G8929 Other chronic pain: Secondary | ICD-10-CM | POA: Diagnosis present

## 2024-05-24 DIAGNOSIS — G894 Chronic pain syndrome: Secondary | ICD-10-CM | POA: Diagnosis not present

## 2024-05-24 DIAGNOSIS — M542 Cervicalgia: Secondary | ICD-10-CM | POA: Insufficient documentation

## 2024-05-24 DIAGNOSIS — M5416 Radiculopathy, lumbar region: Secondary | ICD-10-CM | POA: Insufficient documentation

## 2024-05-24 DIAGNOSIS — M2550 Pain in unspecified joint: Secondary | ICD-10-CM | POA: Insufficient documentation

## 2024-05-24 DIAGNOSIS — M7061 Trochanteric bursitis, right hip: Secondary | ICD-10-CM | POA: Insufficient documentation

## 2024-05-24 DIAGNOSIS — M25511 Pain in right shoulder: Secondary | ICD-10-CM | POA: Diagnosis not present

## 2024-05-24 DIAGNOSIS — M5412 Radiculopathy, cervical region: Secondary | ICD-10-CM | POA: Diagnosis not present

## 2024-05-24 MED ORDER — OXYCODONE-ACETAMINOPHEN 10-325 MG PO TABS: 1.0000 | ORAL_TABLET | ORAL | 0 refills | Status: DC | PRN

## 2024-05-29 LAB — DRUG TOX MONITOR 1 W/CONF, ORAL FLD
Amphetamines: NEGATIVE ng/mL (ref <10)
Barbiturates: NEGATIVE ng/mL (ref <10)
Benzodiazepines: NEGATIVE ng/mL (ref <0.50)
Buprenorphine: NEGATIVE ng/mL (ref <0.10)
Cocaine: NEGATIVE ng/mL (ref <5.0)
Codeine: NEGATIVE ng/mL (ref <2.5)
Dihydrocodeine: NEGATIVE ng/mL (ref <2.5)
Fentanyl: NEGATIVE ng/mL (ref <0.10)
Heroin Metabolite: NEGATIVE ng/mL (ref <1.0)
Hydrocodone: NEGATIVE ng/mL (ref <2.5)
Hydromorphone: NEGATIVE ng/mL (ref <2.5)
MARIJUANA: NEGATIVE ng/mL (ref <2.5)
MDMA: NEGATIVE ng/mL (ref <10)
Meprobamate: NEGATIVE ng/mL (ref <2.5)
Methadone: NEGATIVE ng/mL (ref <5.0)
Morphine: NEGATIVE ng/mL (ref <2.5)
Nicotine Metabolite: NEGATIVE ng/mL (ref <5.0)
Norhydrocodone: NEGATIVE ng/mL (ref <2.5)
Noroxycodone: 21.2 ng/mL — ABNORMAL HIGH (ref <2.5)
Opiates: POSITIVE ng/mL — AB (ref <2.5)
Oxycodone: 101.2 ng/mL — ABNORMAL HIGH (ref <2.5)
Oxymorphone: NEGATIVE ng/mL (ref <2.5)
Phencyclidine: NEGATIVE ng/mL (ref <10)
Tapentadol: NEGATIVE ng/mL (ref <5.0)
Tramadol: NEGATIVE ng/mL (ref <5.0)
Zolpidem: NEGATIVE ng/mL (ref <5.0)

## 2024-05-29 LAB — DRUG TOX ALC METAB W/CON, ORAL FLD: Alcohol Metabolite: NEGATIVE ng/mL (ref <25)

## 2024-06-12 ENCOUNTER — Other Ambulatory Visit: Payer: Self-pay | Admitting: Family

## 2024-06-12 NOTE — Telephone Encounter (Signed)
 High risk warning

## 2024-06-18 ENCOUNTER — Other Ambulatory Visit: Payer: Self-pay | Admitting: Internal Medicine

## 2024-06-21 NOTE — Progress Notes (Signed)
 Subjective:    Patient ID: Katherine Delgado, female    DOB: 01/23/1941, 83 y.o.   MRN: [id number]  HPI: Katherine Delgado is a 83 y.o. female who returns for follow up appointment for chronic pain and medication refill. She states her pain is located in her neck radiating into her bilateral shoulders, bilateral shoulder pain, lower back radiating into her bilateral lower extremities. Also reports bilateral hip pain,bilateral knee pain, generalized joint pain and bilateral feet with tingling and burning. She rates her pain 10. Her current exercise regime is walking with her walker in her home short distances and performing stretching exercises.  Ms. Ransome Morphine  equivalent is 90.00 MME.   Last Oral Swab was Performed on 05/24/2024, it was consistent.      Pain Inventory Average Pain 1 Pain Right Now 10 My pain is constant, sharp, burning, dull, stabbing, and aching  In the last 24 hours, has pain interfered with the following? General activity 10 Relation with others 10 Enjoyment of life 10 What TIME of day is your pain at its worst? morning  and night Sleep (in general) Poor  Pain is worse with: walking, bending, sitting, inactivity, standing, and some activites Pain improves with: rest and medication Relief from Meds: 1  Family History  Problem Relation Age of Onset   Alzheimer's disease Mother    Heart disease Mother    Heart disease Father    Liver disease Father    Cancer Brother    Colon polyps Brother    Arthritis Son    Colon cancer Neg Hx    Esophageal cancer Neg Hx    Kidney disease Neg Hx    Stomach cancer Neg Hx    Rectal cancer Neg Hx    Social History   Socioeconomic History   Marital status: Widowed    Spouse name: Not on file   Number of children: 2   Years of education: 14   Highest education level: Not on file  Occupational History   Occupation: Retired  Tobacco Use   Smoking status: Former    Current packs/day: 0.00    Average packs/day: 0.1  packs/day for 10.0 years (1.0 ttl pk-yrs)    Types: Cigarettes    Start date: 12/06/1969    Quit date: 12/07/1979    Years since quitting: 44.5    Passive exposure: Current   Smokeless tobacco: Never  Vaping Use   Vaping status: Never Used  Substance and Sexual Activity   Alcohol  use: No    Alcohol /week: 0.0 standard drinks of alcohol    Drug use: No   Sexual activity: Not Currently  Other Topics Concern   Not on file  Social History Narrative   Walks with cane   Right handed    Caffeine use: Coffee (2 cups every morning)   Tea: sometimes   Soda: none   Social Drivers of Corporate Investment Banker Strain: Medium Risk (10/05/2017)   Overall Financial Resource Strain (CARDIA)    Difficulty of Paying Living Expenses: Somewhat hard  Food Insecurity: No Food Insecurity (10/25/2022)   Hunger Vital Sign    Worried About Running Out of Food in the Last Year: Never true    Ran Out of Food in the Last Year: Never true  Transportation Needs: No Transportation Needs (10/25/2022)   PRAPARE - Administrator, Civil Service (Medical): No    Lack of Transportation (Non-Medical): No  Physical Activity: Inactive (10/05/2017)   Exercise Vital Sign  Days of Exercise per Week: 0 days    Minutes of Exercise per Session: 0 min  Stress: Stress Concern Present (10/05/2017)   [employer] of Occupational Health - Occupational Stress Questionnaire    Feeling of Stress : Rather much  Social Connections: Moderately Isolated (10/05/2017)   Social Connection and Isolation Panel    Frequency of Communication with Friends and Family: More than three times a week    Frequency of Social Gatherings with Friends and Family: More than three times a week    Attends Religious Services: Never    Database Administrator or Organizations: No    Attends Banker Meetings: Never    Marital Status: Widowed   Past Surgical History:  Procedure Laterality Date   ABDOMINAL HYSTERECTOMY  1974    abdominal tumor  2002   APPENDECTOMY     APPLICATION OF A-CELL OF BACK N/A 09/26/2018   Procedure: With Acell;  Surgeon: Lowery Estefana RAMAN, DO;  [facility];  Service: Plastics;  Laterality: N/A;   APPLICATION OF WOUND VAC N/A 09/26/2018   Procedure: Vac placement;  Surgeon: Lowery Estefana RAMAN, DO;  [facility]: [facility];  Service: Plastics;  Laterality: N/A;   BACK SURGERY  1982   BRONCHOSCOPY  2001   CATARACT EXTRACTION, BILATERAL  09/2019, 10/2019   CHOLECYSTECTOMY  1984   INCISION AND DRAINAGE OF WOUND N/A 09/26/2018   Procedure: Debridement of spine wound;  Surgeon: Lowery Estefana RAMAN, DO;  [facility];  Service: Plastics;  Laterality: N/A;   KNEE SURGERY Bilateral 08/27/2010 (L) and 01/11/2011 (R)   LUMBAR LAMINECTOMY/DECOMPRESSION MICRODISCECTOMY N/A 07/03/2018   Procedure: Lumbar Three to Lumbar Five Laminectomy;  Surgeon: Cheryle Debby LABOR, MD;  [facility];  Service: Neurosurgery;  Laterality: N/A;   LUMBAR WOUND DEBRIDEMENT N/A 08/20/2018   Procedure: POSTERIOR LUMBAR SPINAL WOUND DEBRIDEMENT AND REVISION;  Surgeon: Cheryle Debby LABOR, MD;  [facility];  Service: Neurosurgery;  Laterality: N/A;  POSTERIOR LUMBAR SPINAL WOUND REVISION   miscarrage  1962   OVARIAN CYST SURGERY  1968   thymus tumor     thymus tumor  10/2000   TONSILLECTOMY     Past Surgical History:  Procedure Laterality Date   ABDOMINAL HYSTERECTOMY  1974   abdominal tumor  2002   APPENDECTOMY     APPLICATION OF A-CELL OF BACK N/A 09/26/2018   Procedure: With Acell;  Surgeon: Lowery Estefana RAMAN, DO;  [facility];  Service: Plastics;  Laterality: N/A;   APPLICATION OF WOUND VAC N/A 09/26/2018   Procedure: Vac placement;  Surgeon: Lowery Estefana RAMAN, DO;  [facility];  Service: Plastics;  Laterality: N/A;   BACK SURGERY  1982   BRONCHOSCOPY  2001   CATARACT EXTRACTION, BILATERAL  09/2019, 10/2019   CHOLECYSTECTOMY  1984   INCISION AND DRAINAGE OF WOUND N/A 09/26/2018   Procedure:  Debridement of spine wound;  Surgeon: Lowery Estefana RAMAN, DO;  [facility];  Service: Plastics;  Laterality: N/A;   KNEE SURGERY Bilateral 08/27/2010 (L) and 01/11/2011 (R)   LUMBAR LAMINECTOMY/DECOMPRESSION MICRODISCECTOMY N/A 07/03/2018   Procedure: Lumbar Three to Lumbar Five Laminectomy;  Surgeon: Cheryle Debby LABOR, MD;  [facility];  Service: Neurosurgery;  Laterality: N/A;   LUMBAR WOUND DEBRIDEMENT N/A 08/20/2018   Procedure: POSTERIOR LUMBAR SPINAL WOUND DEBRIDEMENT AND REVISION;  Surgeon: Cheryle Debby LABOR, MD;  [facility];  Service: Neurosurgery;  Laterality: N/A;  POSTERIOR LUMBAR SPINAL WOUND REVISION   miscarrage  1962   OVARIAN CYST SURGERY  1968   thymus tumor     thymus tumor  10/2000   TONSILLECTOMY     Past Medical History:  Diagnosis Date   Abnormality of gait 04/19/2016   Acute infective polyneuritis 2002   Allergic rhinitis due to pollen    Chronic pain syndrome    COPD (chronic obstructive pulmonary disease) (HCC)    chronic bronchitis   Cystitis    Depressive disorder, not elsewhere classified    Diabetes mellitus without complication (HCC)    Diaphragmatic hernia without mention of obstruction or gangrene    Dyslipidemia    Fibromyalgia    GERD (gastroesophageal reflux disease)    with h/o esophagitis   Guillain-Barre syndrome    History of benign thymus tumor    Insomnia, unspecified    Lumbar spinal stenosis 03/30/2018   L4-5 level, severe   Miscarriage 1962   Mixed hyperlipidemia    Morbid obesity (HCC)    Osteoporosis    Pneumonia    Polyneuropathy in diabetes(357.2)    Rheumatoid arthritis(714.0)    Spondylosis, lumbosacral    Spontaneous ecchymoses    Type II or unspecified type diabetes mellitus with peripheral circulatory disorders, uncontrolled(250.72)    Unspecified chronic bronchitis (HCC)    Unspecified essential hypertension    Unspecified hypothyroidism    Unspecified pruritic disorder    Unspecified urinary  incontinence    There were no vitals taken for this visit.  Opioid Risk Score:   Fall Risk Score:  `1  Depression screen [facility] 2/9     03/28/2024    9:25 AM 03/01/2024    8:57 AM 12/28/2023   10:47 AM 09/12/2023   10:25 AM 08/11/2023    9:27 AM 07/12/2023   10:59 AM 06/09/2023    9:28 AM  Depression screen PHQ 2/9  Decreased Interest 1 0 0 0 0 0 0  Down, Depressed, Hopeless 1 0 0 0 0 1 0  PHQ - 2 Score 2 0 0 0 0 1 0    Review of Systems  Musculoskeletal:  Positive for back pain, gait problem and neck pain.       Pain in legs, both arms, knees & feet - all over the body  All other systems reviewed and are negative.      Objective:   Physical Exam Vitals and nursing note reviewed.  Constitutional:      Appearance: Normal appearance.  Cardiovascular:     Rate and Rhythm: Normal rate and regular rhythm.     Pulses: Normal pulses.     Heart sounds: Normal heart sounds.  Pulmonary:     Effort: Pulmonary effort is normal.     Breath sounds: Normal breath sounds.  Musculoskeletal:     Comments: Normal Muscle Bulk and Muscle Testing Reveals:  Upper Extremities: Right: Full ROM and Muscle Strength  5/5 Left Upper Extremity: Decreased ROM 45 Degrees and Muscle Strength 5/5  Lumbar Paraspinal Tenderness: L-3-L-5 Left Greater Trochanter Tenderness Lower Extremities: Full ROM and Muscle Strength 5/5 Left Lower Extremity Flexion Produces Pain into her Left Patella and Left Foot Arrived in wheelchair      Skin:    General: Skin is warm and dry.  Neurological:     Mental Status: She is alert and oriented to person, place, and time.  Psychiatric:        Mood and Affect: Mood normal.        Behavior: Behavior normal.  Assessment & Plan:  1..Failed Back Syndrome: Continue HEP as Tolerated. Continue to Monitor. 06/24/2024 2.Lumbar Radiculitis/ Chronic Low Back Pain without Sciatica: Continue Gabapentin . Continue to monitor. Continue HEP as Tolerated  06/24/2024 3.Bilateral  Greater Trochanter Bursitis: .Continue to Alternate Ice and Heat Therapy. Continue current medication regimen. Continue to monitor. 06/24/2024 4.Bilateral Primary OA:/ Both Knees  Continue HEP as Tolerated. Continue to Monitor. 06/24/2024 5.Insomnia: Continue Amitriptyline  . Continue to Monitor.06/24/2024 6.Chronic Pain Syndrome: Refilled: Oxycodone  10/325 mg one tablet every 4 hours as needed for pain #180. We will continue the opioid monitoring program, this consists of regular clinic visits, examinations, urine drug screen, pill counts as well as use of [place]  Controlled Substance Reporting system. A 12 month History has been reviewed on the [place]  Controlled Substance Reporting System on 06/24/2024 7. Cervicalgia/ Cervical Radiculitis: Continue Gabapentin . Continue HEP as tolerated. Continue to monitor. 11/03//2025 8. Polyarthralgia: Continue HEP as tolerated. Continue to monitor. 06/24/2024 9. Polyneuropathy: Continue Gabapentin . Continue to Monitor. 06/24/2024   F/U in 1 month

## 2024-06-24 ENCOUNTER — Encounter: Payer: Self-pay | Admitting: Registered Nurse

## 2024-06-24 ENCOUNTER — Encounter: Attending: Physical Medicine and Rehabilitation | Admitting: Registered Nurse

## 2024-06-24 VITALS — BP 109/64 | HR 69 | Ht 66.0 in

## 2024-06-24 DIAGNOSIS — M5412 Radiculopathy, cervical region: Secondary | ICD-10-CM | POA: Diagnosis present

## 2024-06-24 DIAGNOSIS — M7061 Trochanteric bursitis, right hip: Secondary | ICD-10-CM | POA: Insufficient documentation

## 2024-06-24 DIAGNOSIS — M5416 Radiculopathy, lumbar region: Secondary | ICD-10-CM | POA: Insufficient documentation

## 2024-06-24 DIAGNOSIS — M170 Bilateral primary osteoarthritis of knee: Secondary | ICD-10-CM | POA: Insufficient documentation

## 2024-06-24 DIAGNOSIS — G8929 Other chronic pain: Secondary | ICD-10-CM | POA: Diagnosis present

## 2024-06-24 DIAGNOSIS — Z5181 Encounter for therapeutic drug level monitoring: Secondary | ICD-10-CM | POA: Diagnosis present

## 2024-06-24 DIAGNOSIS — M7062 Trochanteric bursitis, left hip: Secondary | ICD-10-CM | POA: Insufficient documentation

## 2024-06-24 DIAGNOSIS — M25512 Pain in left shoulder: Secondary | ICD-10-CM | POA: Diagnosis present

## 2024-06-24 DIAGNOSIS — Z79891 Long term (current) use of opiate analgesic: Secondary | ICD-10-CM | POA: Diagnosis present

## 2024-06-24 DIAGNOSIS — M25511 Pain in right shoulder: Secondary | ICD-10-CM | POA: Insufficient documentation

## 2024-06-24 DIAGNOSIS — M542 Cervicalgia: Secondary | ICD-10-CM | POA: Insufficient documentation

## 2024-06-24 DIAGNOSIS — G894 Chronic pain syndrome: Secondary | ICD-10-CM | POA: Insufficient documentation

## 2024-06-24 DIAGNOSIS — M961 Postlaminectomy syndrome, not elsewhere classified: Secondary | ICD-10-CM | POA: Diagnosis present

## 2024-06-24 DIAGNOSIS — M2550 Pain in unspecified joint: Secondary | ICD-10-CM | POA: Insufficient documentation

## 2024-06-24 DIAGNOSIS — G6289 Other specified polyneuropathies: Secondary | ICD-10-CM | POA: Diagnosis present

## 2024-06-24 MED ORDER — OXYCODONE-ACETAMINOPHEN 10-325 MG PO TABS: 1.0000 | ORAL_TABLET | ORAL | 0 refills | Status: DC | PRN

## 2024-07-16 NOTE — Progress Notes (Deleted)
 Subjective:    Patient ID: Katherine Delgado, female    DOB: 18-Jun-1941, 83 y.o.   MRN: [id number]  HPI   Pain Inventory Average Pain {NUMBERS; 0-10:5044} Pain Right Now {NUMBERS; 0-10:5044} My pain is {PAIN DESCRIPTION:21022940}  In the last 24 hours, has pain interfered with the following? General activity {NUMBERS; 0-10:5044} Relation with others {NUMBERS; 0-10:5044} Enjoyment of life {NUMBERS; 0-10:5044} What TIME of day is your pain at its worst? {time of day:24191} Sleep (in general) {BHH GOOD/FAIR/POOR:22877}  Pain is worse with: {ACTIVITIES:21022942} Pain improves with: {PAIN IMPROVES TPUY:78977056} Relief from Meds: {NUMBERS; 0-10:5044}  Family History  Problem Relation Age of Onset   Alzheimer's disease Mother    Heart disease Mother    Heart disease Father    Liver disease Father    Cancer Brother    Colon polyps Brother    Arthritis Son    Colon cancer Neg Hx    Esophageal cancer Neg Hx    Kidney disease Neg Hx    Stomach cancer Neg Hx    Rectal cancer Neg Hx    Social History   Socioeconomic History   Marital status: Widowed    Spouse name: Not on file   Number of children: 2   Years of education: 14   Highest education level: Not on file  Occupational History   Occupation: Retired  Tobacco Use   Smoking status: Former    Current packs/day: 0.00    Average packs/day: 0.1 packs/day for 10.0 years (1.0 ttl pk-yrs)    Types: Cigarettes    Start date: 12/06/1969    Quit date: 12/07/1979    Years since quitting: 44.6    Passive exposure: Current   Smokeless tobacco: Never  Vaping Use   Vaping status: Never Used  Substance and Sexual Activity   Alcohol  use: No    Alcohol /week: 0.0 standard drinks of alcohol    Drug use: No   Sexual activity: Not Currently  Other Topics Concern   Not on file  Social History Narrative   Walks with cane   Right handed    Caffeine use: Coffee (2 cups every morning)   Tea: sometimes   Soda: none   Social Drivers  of Corporate Investment Banker Strain: Medium Risk (10/05/2017)   Overall Financial Resource Strain (CARDIA)    Difficulty of Paying Living Expenses: Somewhat hard  Food Insecurity: No Food Insecurity (10/25/2022)   Hunger Vital Sign    Worried About Running Out of Food in the Last Year: Never true    Ran Out of Food in the Last Year: Never true  Transportation Needs: No Transportation Needs (10/25/2022)   PRAPARE - Administrator, Civil Service (Medical): No    Lack of Transportation (Non-Medical): No  Physical Activity: Inactive (10/05/2017)   Exercise Vital Sign    Days of Exercise per Week: 0 days    Minutes of Exercise per Session: 0 min  Stress: Stress Concern Present (10/05/2017)   [employer] of Occupational Health - Occupational Stress Questionnaire    Feeling of Stress : Rather much  Social Connections: Moderately Isolated (10/05/2017)   Social Connection and Isolation Panel    Frequency of Communication with Friends and Family: More than three times a week    Frequency of Social Gatherings with Friends and Family: More than three times a week    Attends Religious Services: Never    Database Administrator or Organizations: No    Attends [employer]  or Organization Meetings: Never    Marital Status: Widowed   Past Surgical History:  Procedure Laterality Date   ABDOMINAL HYSTERECTOMY  1974   abdominal tumor  2002   APPENDECTOMY     APPLICATION OF A-CELL OF BACK N/A 09/26/2018   Procedure: With Acell;  Surgeon: Lowery Estefana RAMAN, DO;  [facility];  Service: Plastics;  Laterality: N/A;   APPLICATION OF WOUND VAC N/A 09/26/2018   Procedure: Vac placement;  Surgeon: Lowery Estefana RAMAN, DO;  [facility];  Service: Plastics;  Laterality: N/A;   BACK SURGERY  1982   BRONCHOSCOPY  2001   CATARACT EXTRACTION, BILATERAL  09/2019, 10/2019   CHOLECYSTECTOMY  1984   INCISION AND DRAINAGE OF WOUND N/A 09/26/2018   Procedure: Debridement of spine wound;  Surgeon:  Lowery Estefana RAMAN, DO;  [facility];  Service: Plastics;  Laterality: N/A;   KNEE SURGERY Bilateral 08/27/2010 (L) and 01/11/2011 (R)   LUMBAR LAMINECTOMY/DECOMPRESSION MICRODISCECTOMY N/A 07/03/2018   Procedure: Lumbar Three to Lumbar Five Laminectomy;  Surgeon: Cheryle Debby LABOR, MD;  [facility];  Service: Neurosurgery;  Laterality: N/A;   LUMBAR WOUND DEBRIDEMENT N/A 08/20/2018   Procedure: POSTERIOR LUMBAR SPINAL WOUND DEBRIDEMENT AND REVISION;  Surgeon: Cheryle Debby LABOR, MD;  [facility];  Service: Neurosurgery;  Laterality: N/A;  POSTERIOR LUMBAR SPINAL WOUND REVISION   miscarrage  1962   OVARIAN CYST SURGERY  1968   thymus tumor     thymus tumor  10/2000   TONSILLECTOMY     Past Surgical History:  Procedure Laterality Date   ABDOMINAL HYSTERECTOMY  1974   abdominal tumor  2002   APPENDECTOMY     APPLICATION OF A-CELL OF BACK N/A 09/26/2018   Procedure: With Acell;  Surgeon: Lowery Estefana RAMAN, DO;  [facility];  Service: Plastics;  Laterality: N/A;   APPLICATION OF WOUND VAC N/A 09/26/2018   Procedure: Vac placement;  Surgeon: Lowery Estefana RAMAN, DO;  [facility];  Service: Plastics;  Laterality: N/A;   BACK SURGERY  1982   BRONCHOSCOPY  2001   CATARACT EXTRACTION, BILATERAL  09/2019, 10/2019   CHOLECYSTECTOMY  1984   INCISION AND DRAINAGE OF WOUND N/A 09/26/2018   Procedure: Debridement of spine wound;  Surgeon: Lowery Estefana RAMAN, DO;  [facility];  Service: Plastics;  Laterality: N/A;   KNEE SURGERY Bilateral 08/27/2010 (L) and 01/11/2011 (R)   LUMBAR LAMINECTOMY/DECOMPRESSION MICRODISCECTOMY N/A 07/03/2018   Procedure: Lumbar Three to Lumbar Five Laminectomy;  Surgeon: Cheryle Debby LABOR, MD;  [facility];  Service: Neurosurgery;  Laterality: N/A;   LUMBAR WOUND DEBRIDEMENT N/A 08/20/2018   Procedure: POSTERIOR LUMBAR SPINAL WOUND DEBRIDEMENT AND REVISION;  Surgeon: Cheryle Debby LABOR, MD;  [facility];  Service: Neurosurgery;   Laterality: N/A;  POSTERIOR LUMBAR SPINAL WOUND REVISION   miscarrage  1962   OVARIAN CYST SURGERY  1968   thymus tumor     thymus tumor  10/2000   TONSILLECTOMY     Past Medical History:  Diagnosis Date   Abnormality of gait 04/19/2016   Acute infective polyneuritis 2002   Allergic rhinitis due to pollen    Chronic pain syndrome    COPD (chronic obstructive pulmonary disease) (HCC)    chronic bronchitis   Cystitis    Depressive disorder, not elsewhere classified    Diabetes mellitus without complication (HCC)    Diaphragmatic hernia without mention of obstruction or gangrene    Dyslipidemia    Fibromyalgia  GERD (gastroesophageal reflux disease)    with h/o esophagitis   Guillain-Barre syndrome    History of benign thymus tumor    Insomnia, unspecified    Lumbar spinal stenosis 03/30/2018   L4-5 level, severe   Miscarriage 1962   Mixed hyperlipidemia    Morbid obesity (HCC)    Osteoporosis    Pneumonia    Polyneuropathy in diabetes(357.2)    Rheumatoid arthritis(714.0)    Spondylosis, lumbosacral    Spontaneous ecchymoses    Type II or unspecified type diabetes mellitus with peripheral circulatory disorders, uncontrolled(250.72)    Unspecified chronic bronchitis (HCC)    Unspecified essential hypertension    Unspecified hypothyroidism    Unspecified pruritic disorder    Unspecified urinary incontinence    There were no vitals taken for this visit.  Opioid Risk Score:   Fall Risk Score:  `1  Depression screen [facility] 2/9     06/24/2024   11:23 AM 03/28/2024    9:25 AM 03/01/2024    8:57 AM 12/28/2023   10:47 AM 09/12/2023   10:25 AM 08/11/2023    9:27 AM 07/12/2023   10:59 AM  Depression screen PHQ 2/9  Decreased Interest 0 1 0 0 0 0 0  Down, Depressed, Hopeless 0 1 0 0 0 0 1  PHQ - 2 Score 0 2 0 0 0 0 1    Review of Systems     Objective:   Physical Exam        Assessment & Plan:

## 2024-07-23 ENCOUNTER — Encounter: Admitting: Registered Nurse

## 2024-07-23 ENCOUNTER — Telehealth: Payer: Self-pay

## 2024-07-23 DIAGNOSIS — M170 Bilateral primary osteoarthritis of knee: Secondary | ICD-10-CM

## 2024-07-23 DIAGNOSIS — M542 Cervicalgia: Secondary | ICD-10-CM

## 2024-07-23 DIAGNOSIS — G8929 Other chronic pain: Secondary | ICD-10-CM

## 2024-07-23 DIAGNOSIS — M961 Postlaminectomy syndrome, not elsewhere classified: Secondary | ICD-10-CM

## 2024-07-23 DIAGNOSIS — G894 Chronic pain syndrome: Secondary | ICD-10-CM

## 2024-07-23 MED ORDER — OXYCODONE-ACETAMINOPHEN 10-325 MG PO TABS: 1.0000 | ORAL_TABLET | ORAL | 0 refills | Status: DC | PRN

## 2024-07-23 NOTE — Telephone Encounter (Signed)
 PDMP was Reviewed.  Oxycodone  e-scribed to pharmacy.  Katherine Delgado had to changer her appointment, due to her son recent surgery.

## 2024-07-23 NOTE — Telephone Encounter (Signed)
 Patient called stating she can not come in for her appt today due to lack of transportation. She is rescheduled for 08/23/24 and needs a refill on her pain medication.

## 2024-08-07 ENCOUNTER — Ambulatory Visit: Payer: Self-pay

## 2024-08-07 NOTE — Telephone Encounter (Signed)
 FYI Only or Action Required?: FYI only for provider: urgent care/ no available appt.  Patient was last seen in primary care on 04/17/2024 by Ngetich, Roxan BROCKS, NP.  Called Nurse Triage reporting Dysuria.  Symptoms began several weeks ago.  Interventions attempted: Nothing.  Symptoms are: gradually worsening.  Triage Disposition: See HCP Within 4 Hours (Or PCP Triage)  Patient/caregiver understands and will follow disposition?: Yes   Copied from CRM #[id number]. Topic: Clinical - Red Word Triage >> Aug 07, 2024 10:40 AM Chiquita SQUIBB wrote: Red Word that prompted transfer to Nurse Triage: Patient is calling in with severe pain while using the bathroom, patient believes she has a UTI or yeast infection. Reason for Disposition  Side (flank) or lower back pain present  Answer Assessment - Initial Assessment Questions 1. SYMPTOM: What's the main symptom you're concerned about? (e.g., frequency, incontinence)     Pain with urination onset a few weeks ago/ 7/10  4. CAUSE: What do you think is causing the symptoms?     UTI 5. OTHER SYMPTOMS: Do you have any other symptoms? (e.g., blood in urine, fever, flank pain, pain with urination)     Back pain 6. PREGNANCY: Is there any chance you are pregnant? When was your last menstrual period?     *No Answer*  Protocols used: Urinary Symptoms-A-AH

## 2024-08-18 ENCOUNTER — Other Ambulatory Visit: Payer: Self-pay | Admitting: Family

## 2024-08-20 ENCOUNTER — Emergency Department (HOSPITAL_COMMUNITY)

## 2024-08-20 ENCOUNTER — Ambulatory Visit
Admission: EM | Admit: 2024-08-20 | Discharge: 2024-08-20 | Disposition: A | Discharge status: Other Acute Inpatient Hospital | Attending: Nurse Practitioner | Admitting: Nurse Practitioner

## 2024-08-20 ENCOUNTER — Encounter: Payer: Self-pay | Admitting: Nurse Practitioner

## 2024-08-20 ENCOUNTER — Other Ambulatory Visit: Payer: Self-pay

## 2024-08-20 ENCOUNTER — Emergency Department (HOSPITAL_COMMUNITY)
Admission: EM | Admit: 2024-08-20 | Discharge: 2024-08-20 | Disposition: A | Discharge status: Home / Self Care | Attending: Emergency Medicine | Admitting: Emergency Medicine

## 2024-08-20 DIAGNOSIS — Z7982 Long term (current) use of aspirin: Secondary | ICD-10-CM | POA: Diagnosis not present

## 2024-08-20 DIAGNOSIS — R002 Palpitations: Secondary | ICD-10-CM | POA: Diagnosis not present

## 2024-08-20 DIAGNOSIS — E119 Type 2 diabetes mellitus without complications: Secondary | ICD-10-CM | POA: Diagnosis not present

## 2024-08-20 DIAGNOSIS — Z7951 Long term (current) use of inhaled steroids: Secondary | ICD-10-CM | POA: Diagnosis not present

## 2024-08-20 DIAGNOSIS — Z79899 Other long term (current) drug therapy: Secondary | ICD-10-CM | POA: Diagnosis not present

## 2024-08-20 DIAGNOSIS — R0602 Shortness of breath: Secondary | ICD-10-CM | POA: Diagnosis not present

## 2024-08-20 DIAGNOSIS — R600 Localized edema: Secondary | ICD-10-CM | POA: Insufficient documentation

## 2024-08-20 DIAGNOSIS — Z794 Long term (current) use of insulin: Secondary | ICD-10-CM | POA: Insufficient documentation

## 2024-08-20 DIAGNOSIS — R000 Tachycardia, unspecified: Secondary | ICD-10-CM

## 2024-08-20 DIAGNOSIS — I10 Essential (primary) hypertension: Secondary | ICD-10-CM | POA: Diagnosis not present

## 2024-08-20 DIAGNOSIS — J449 Chronic obstructive pulmonary disease, unspecified: Secondary | ICD-10-CM | POA: Diagnosis not present

## 2024-08-20 DIAGNOSIS — I4719 Other supraventricular tachycardia: Secondary | ICD-10-CM

## 2024-08-20 LAB — BASIC METABOLIC PANEL WITH GFR
Anion gap: 15 (ref 5–15)
BUN: 25 mg/dL — ABNORMAL HIGH (ref 8–23)
CO2: 23 mmol/L (ref 22–32)
Calcium: 9.5 mg/dL (ref 8.9–10.3)
Chloride: 98 mmol/L (ref 98–111)
Creatinine, Ser: 1.18 mg/dL — ABNORMAL HIGH (ref 0.44–1.00)
GFR, Estimated: 46 mL/min — ABNORMAL LOW
Glucose, Bld: 166 mg/dL — ABNORMAL HIGH (ref 70–99)
Potassium: 4 mmol/L (ref 3.5–5.1)
Sodium: 136 mmol/L (ref 135–145)

## 2024-08-20 LAB — I-STAT CHEM 8, ED
BUN: 27 mg/dL — ABNORMAL HIGH (ref 8–23)
Calcium, Ion: 1.09 mmol/L — ABNORMAL LOW (ref 1.15–1.40)
Chloride: 100 mmol/L (ref 98–111)
Creatinine, Ser: 1.2 mg/dL — ABNORMAL HIGH (ref 0.44–1.00)
Glucose, Bld: 165 mg/dL — ABNORMAL HIGH (ref 70–99)
HCT: 47 % — ABNORMAL HIGH (ref 36.0–46.0)
Hemoglobin: 16 g/dL — ABNORMAL HIGH (ref 12.0–15.0)
Potassium: 3.9 mmol/L (ref 3.5–5.1)
Sodium: 137 mmol/L (ref 135–145)
TCO2: 24 mmol/L (ref 22–32)

## 2024-08-20 LAB — HEPATIC FUNCTION PANEL
ALT: 7 U/L (ref 0–44)
AST: 20 U/L (ref 15–41)
Albumin: 3.8 g/dL (ref 3.5–5.0)
Alkaline Phosphatase: 109 U/L (ref 38–126)
Bilirubin, Direct: 0.1 mg/dL (ref 0.0–0.2)
Indirect Bilirubin: 0.3 mg/dL (ref 0.3–0.9)
Total Bilirubin: 0.4 mg/dL (ref 0.0–1.2)
Total Protein: 7.6 g/dL (ref 6.5–8.1)

## 2024-08-20 LAB — CBC
HCT: 46.4 % — ABNORMAL HIGH (ref 36.0–46.0)
Hemoglobin: 14.9 g/dL (ref 12.0–15.0)
MCH: 27.3 pg (ref 26.0–34.0)
MCHC: 32.1 g/dL (ref 30.0–36.0)
MCV: 85 fL (ref 80.0–100.0)
Platelets: 374 K/uL (ref 150–400)
RBC: 5.46 MIL/uL — ABNORMAL HIGH (ref 3.87–5.11)
RDW: 14.7 % (ref 11.5–15.5)
WBC: 16.5 K/uL — ABNORMAL HIGH (ref 4.0–10.5)
nRBC: 0 % (ref 0.0–0.2)

## 2024-08-20 LAB — TSH: TSH: 0.822 u[IU]/mL (ref 0.350–4.500)

## 2024-08-20 LAB — I-STAT CG4 LACTIC ACID, ED
Lactic Acid, Venous: 2.2 mmol/L (ref 0.5–1.9)
Lactic Acid, Venous: 1.2 mmol/L (ref 0.5–1.9)

## 2024-08-20 LAB — MAGNESIUM: Magnesium: 2.1 mg/dL (ref 1.7–2.4)

## 2024-08-20 LAB — RESP PANEL BY RT-PCR (RSV, FLU A&B, COVID)  RVPGX2
Influenza A by PCR: NEGATIVE
Influenza B by PCR: NEGATIVE
Resp Syncytial Virus by PCR: NEGATIVE
SARS Coronavirus 2 by RT PCR: NEGATIVE

## 2024-08-20 LAB — PRO BRAIN NATRIURETIC PEPTIDE: Pro Brain Natriuretic Peptide: 641 pg/mL — ABNORMAL HIGH

## 2024-08-20 LAB — CBG MONITORING, ED: Glucose-Capillary: 174 mg/dL — ABNORMAL HIGH (ref 70–99)

## 2024-08-20 LAB — TROPONIN T, HIGH SENSITIVITY: Troponin T High Sensitivity: 42 ng/L — ABNORMAL HIGH (ref 0–19)

## 2024-08-20 LAB — D-DIMER, QUANTITATIVE: D-Dimer, Quant: 1.11 ug{FEU}/mL — ABNORMAL HIGH (ref 0.00–0.50)

## 2024-08-20 MED ORDER — SODIUM CHLORIDE 0.9 % IV BOLUS: 500.0000 mL | Freq: Once | INTRAVENOUS | Status: DC

## 2024-08-20 MED ORDER — SODIUM CHLORIDE 0.9 % IV SOLN
2.0000 g | Freq: Once | INTRAVENOUS | Status: AC
  Administered 2024-08-20: 2 g via INTRAVENOUS
  Filled 2024-08-20: qty 20

## 2024-08-20 MED ORDER — SODIUM CHLORIDE 0.9 % IV SOLN
500.0000 mg | Freq: Once | INTRAVENOUS | Status: AC
  Administered 2024-08-20: 500 mg via INTRAVENOUS
  Filled 2024-08-20: qty 5

## 2024-08-20 MED ORDER — SODIUM CHLORIDE 0.9 % IV BOLUS
500.0000 mL | Freq: Once | INTRAVENOUS | Status: AC
  Administered 2024-08-20: 500 mL via INTRAVENOUS

## 2024-08-20 MED ORDER — IOHEXOL 350 MG/ML SOLN
60.0000 mL | Freq: Once | INTRAVENOUS | Status: AC | PRN
  Administered 2024-08-20: 60 mL via INTRAVENOUS

## 2024-08-20 MED ORDER — METOPROLOL TARTRATE 5 MG/5ML IV SOLN
5.0000 mg | Freq: Once | INTRAVENOUS | Status: AC
  Administered 2024-08-20: 5 mg via INTRAVENOUS
  Filled 2024-08-20: qty 5

## 2024-08-20 NOTE — ED Notes (Signed)
 Patient transported to CT

## 2024-08-20 NOTE — ED Provider Notes (Signed)
" °  Physical Exam  BP (!) 101/59   Pulse 94   Temp 98.1 F (36.7 C) (Oral)   Resp 19   SpO2 98%   Physical Exam Vitals and nursing note reviewed.  HENT:     Head: Normocephalic and atraumatic.  Eyes:     Pupils: Pupils are equal, round, and reactive to light.  Cardiovascular:     Rate and Rhythm: Regular rhythm. Tachycardia present.  Pulmonary:     Effort: Pulmonary effort is normal.     Breath sounds: Normal breath sounds.  Abdominal:     Palpations: Abdomen is soft.     Tenderness: There is no abdominal tenderness.  Skin:    General: Skin is warm and dry.  Neurological:     Mental Status: She is alert.  Psychiatric:        Mood and Affect: Mood normal.     Procedures  Procedures  ED Course / MDM   Clinical Course as of 08/20/24 2046  Tue Aug 20, 2024  2044 CTA chest shows no evidence of pneumonia or PE.  Patient remained in what appears to be multifocal atrial tachycardia most likely in the setting of missed doses of metoprolol .  Her husband has picked up this medication and has this now at home.  Her heart rate improved to 80s 90s normal sinus rhythm after 1 dose of IV metoprolol .  She would be appropriate for discharge and understands the importance of compliance with metoprolol  and other medications. [MP]    Clinical Course User Index [MP] Katherine Delgado LABOR, DO   Medical Decision Making I, Delgado Pamella DO, have assumed care of this patient from the previous provider pending CT of the chest, reevaluation of tachycardia and disposition  Amount and/or Complexity of Data Reviewed Labs: ordered. Radiology: ordered.  Risk Prescription drug management.          Katherine Delgado LABOR, DO 08/20/24 2046  "

## 2024-08-20 NOTE — ED Provider Notes (Signed)
 " [facility]    CSN: [id number] Arrival date & time: 08/20/24  1227      History   Chief Complaint Chief Complaint  Patient presents with   Tachycardia    HPI Katherine Delgado is a 83 y.o. female.   Patient presents for evaluation of palpitations, shortness of breath, feeling near syncopal, and variations in her heart rate and blood pressure since yesterday.  Patient reports that her heart rate has been sustaining near 140, her blood pressure has reached as low as 90 systolic (never over 140/90), and also noticing some variability in her glucose.  She is not currently having any chest pain.  No prior history reported of atrial fib/flutter.  Reports she had a recent URI approximately 1 week ago but is not currently experiencing any other symptoms.  She has taken all of her medications this morning.  However, voices that she ran out of her metoprolol  and the pharmacy didn't have it available to her - has been out of it 3-4 days now.  The history is provided by the patient.    Past Medical History:  Diagnosis Date   Abnormality of gait 04/19/2016   Acute infective polyneuritis 2002   Allergic rhinitis due to pollen    Chronic pain syndrome    COPD (chronic obstructive pulmonary disease) (HCC)    chronic bronchitis   Cystitis    Depressive disorder, not elsewhere classified    Diabetes mellitus without complication (HCC)    Diaphragmatic hernia without mention of obstruction or gangrene    Dyslipidemia    Fibromyalgia    GERD (gastroesophageal reflux disease)    with h/o esophagitis   Guillain-Barre syndrome    History of benign thymus tumor    Insomnia, unspecified    Lumbar spinal stenosis 03/30/2018   L4-5 level, severe   Miscarriage 1962   Mixed hyperlipidemia    Morbid obesity (HCC)    Osteoporosis    Pneumonia    Polyneuropathy in diabetes(357.2)    Rheumatoid arthritis(714.0)    Spondylosis, lumbosacral    Spontaneous ecchymoses    Type II or  unspecified type diabetes mellitus with peripheral circulatory disorders, uncontrolled(250.72)    Unspecified chronic bronchitis (HCC)    Unspecified essential hypertension    Unspecified hypothyroidism    Unspecified pruritic disorder    Unspecified urinary incontinence     Patient Active Problem List   Diagnosis Date Noted   Bilateral lower extremity edema 04/02/2024   PAD (peripheral artery disease) 03/12/2023   Left chronic serous otitis media 05/11/2021   Mixed conductive and sensorineural hearing loss of left ear with restricted hearing of right ear 05/11/2021   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   Primary osteoarthritis of right knee 07/25/2019   Wound dehiscence 08/17/2018   Lumbar spinal stenosis 03/30/2018   Rheumatoid arthritis involving both wrists with positive rheumatoid factor (HCC) 06/14/2016   High risk medication use 06/14/2016   Sjogren's syndrome 06/14/2016   Primary osteoarthritis of both knees 06/14/2016   DDD (degenerative disc disease), lumbar 06/14/2016   Hypothyroidism 06/14/2016   Osteoporosis 06/14/2016   Abnormality of gait 04/19/2016   Type 2 diabetes mellitus with diabetic polyneuropathy, with long-term current use of insulin  (HCC) 11/27/2015   Diabetes mellitus without complication (HCC) 09/12/2013   Headache 07/31/2013   Sinus infection 07/31/2013   Chronic right-sided low back pain with right-sided sciatica 03/14/2013   Insomnia 12/06/2012   Nausea alone 12/06/2012   Diarrhea 12/06/2012   Urinary  incontinence, urge 12/06/2012   Lumbosacral root lesions, not elsewhere classified 11/27/2012   Diabetic polyneuropathy associated with type 2 diabetes mellitus (HCC) 11/27/2012   EDEMA 05/04/2010   YEAST INFECTION 05/03/2010   Hyperlipidemia 05/03/2010   Major depression, recurrent 05/03/2010   History of peripheral neuropathy 05/03/2010   Essential hypertension 05/03/2010   ALLERGIC RHINITIS 05/03/2010   PNEUMONIA 05/03/2010    COPD (chronic obstructive pulmonary disease) (HCC) 05/03/2010   GERD 05/03/2010   Fibromyalgia 05/03/2010   DYSPNEA 05/03/2010   CHEST PAIN 05/03/2010    Past Surgical History:  Procedure Laterality Date   ABDOMINAL HYSTERECTOMY  1974   abdominal tumor  2002   APPENDECTOMY     APPLICATION OF A-CELL OF BACK N/A 09/26/2018   Procedure: With Acell;  Surgeon: Lowery Estefana RAMAN, DO;  Location: [facility] OR;  Service: Plastics;  Laterality: N/A;   APPLICATION OF WOUND VAC N/A 09/26/2018   Procedure: Vac placement;  Surgeon: Lowery Estefana RAMAN, DO;  Location: [facility] OR;  Service: Plastics;  Laterality: N/A;   BACK SURGERY  1982   BRONCHOSCOPY  2001   CATARACT EXTRACTION, BILATERAL  09/2019, 10/2019   CHOLECYSTECTOMY  1984   INCISION AND DRAINAGE OF WOUND N/A 09/26/2018   Procedure: Debridement of spine wound;  Surgeon: Lowery Estefana RAMAN, DO;  Location: [facility] OR;  Service: Plastics;  Laterality: N/A;   KNEE SURGERY Bilateral 08/27/2010 (L) and 01/11/2011 (R)   LUMBAR LAMINECTOMY/DECOMPRESSION MICRODISCECTOMY N/A 07/03/2018   Procedure: Lumbar Three to Lumbar Five Laminectomy;  Surgeon: Cheryle Debby LABOR, MD;  Location: [facility] OR;  Service: Neurosurgery;  Laterality: N/A;   LUMBAR WOUND DEBRIDEMENT N/A 08/20/2018   Procedure: POSTERIOR LUMBAR SPINAL WOUND DEBRIDEMENT AND REVISION;  Surgeon: Cheryle Debby LABOR, MD;  Location: [facility] OR;  Service: Neurosurgery;  Laterality: N/A;  POSTERIOR LUMBAR SPINAL WOUND REVISION   miscarrage  1962   OVARIAN CYST SURGERY  1968   thymus tumor     thymus tumor  10/2000   TONSILLECTOMY      OB History   No obstetric history on file.      Home Medications    Prior to Admission medications  Medication Sig Start Date End Date Taking? Authorizing Provider  amitriptyline  (ELAVIL ) 50 MG tablet Take 1 tablet (50 mg total) by mouth at bedtime. 12/14/21   Raulkar, Sven SQUIBB, MD  amLODipine  (NORVASC ) 10 MG tablet TAKE 1 TABLET(10 MG) BY MOUTH DAILY 03/18/24   Ngetich,  Dinah C, NP  aspirin EC 81 MG tablet Take 81 mg by mouth 2 (two) times a week.    [provider]  augmented betamethasone  dipropionate (DIPROLENE -AF) 0.05 % cream Apply topically 2 (two) times daily as needed. 03/08/23   Ngetich, Dinah C, NP  cadexomer iodine  (IODOSORB) 0.9 % gel Apply 1 Application topically daily as needed for wound care. 09/07/23   Tobie Franky SQUIBB, DPM  cholecalciferol (VITAMIN D3) 25 MCG (1000 UT) tablet Take 2,000 Units by mouth daily.    [provider]  Continuous Glucose Sensor (DEXCOM G7 SENSOR) MISC 1 Device by Does not apply route as directed. 10/04/23   Shamleffer, Ibtehal Jaralla, MD  Cranberry 475 MG CAPS Take 1 capsule (475 mg total) by mouth 2 (two) times daily. 08/31/22   Ngetich, Dinah C, NP  docusate sodium  (COLACE) 100 MG capsule Take 100 mg by mouth daily as needed for mild constipation.    [provider]  DULoxetine  (CYMBALTA ) 20 MG capsule TAKE 1 CAPSULE(20 MG) BY MOUTH DAILY 09/29/21  Lorilee Sven SQUIBB, MD  ezetimibe  (ZETIA ) 10 MG tablet Take 1 tablet (10 mg total) by mouth daily. 06/08/21   Ngetich, Dinah C, NP  fluconazole  (DIFLUCAN ) 150 MG tablet Repeat in 72 hours 04/17/24   Ngetich, Dinah C, NP  fluticasone  (CUTIVATE ) 0.05 % cream Apply topically 2 (two) times daily as needed. 03/08/23   Ngetich, Dinah C, NP  fluticasone  (FLONASE ) 50 MCG/ACT nasal spray SHAKE LIQUID AND USE 2 SPRAYS IN EACH NOSTRIL AT BEDTIME 09/28/22   Landy Barnie RAMAN, NP  folic acid  (FOLVITE ) 1 MG tablet Take 2 tablets (2 mg total) by mouth daily. 02/01/22   Ngetich, Dinah C, NP  furosemide  (LASIX ) 40 MG tablet Take 1 tablet (40 mg total) by mouth daily. 04/02/24   Ngetich, Dinah C, NP  gabapentin  (NEURONTIN ) 600 MG tablet TAKE 1 TABLET(600 MG) BY MOUTH THREE TIMES DAILY 04/10/24   Ngetich, Dinah C, NP  insulin  glargine, 1 Unit Dial, (TOUJEO  SOLOSTAR) 300 UNIT/ML Solostar Pen 36 units QAM and 36 units  QPM 11/08/23   Shamleffer, Ibtehal Jaralla, MD  insulin  lispro  (HUMALOG  KWIKPEN) 200 UNIT/ML KwikPen INJECT UP TO 30 UNITS DAILY AS DIRECTED PER MD 06/18/24   Shamleffer, Ibtehal Jaralla, MD  Insulin  Pen Needle (B-D UF III MINI PEN NEEDLES) 31G X 5 MM MISC 1 Device by Other route in the morning, at noon, in the evening, and at bedtime. 04/03/23   Shamleffer, Donell Cardinal, MD  ipratropium-albuterol  (DUONEB) 0.5-2.5 (3) MG/3ML SOLN Take 3 mLs by nebulization every 6 (six) hours as needed. 04/02/24   Ngetich, Dinah C, NP  ketoconazole  (NIZORAL ) 2 % cream Apply 1 Application topically 2 (two) times daily. 03/08/23   Ngetich, Dinah C, NP  levothyroxine  (SYNTHROID ) 100 MCG tablet Take 1 tablet (100 mcg total) by mouth daily before breakfast. 04/05/24   Shamleffer, Ibtehal Jaralla, MD  lisinopril  (ZESTRIL ) 20 MG tablet TAKE 1 TABLET BY MOUTH EVERY DAY 01/10/24   Ngetich, Dinah C, NP  loperamide (IMODIUM A-D) 2 MG tablet Take 2 mg by mouth 4 (four) times daily as needed for diarrhea or loose stools.    [provider]  magnesium  oxide (MAG-OX) 400 MG tablet Take 1 tablet (400 mg total) by mouth daily. 04/02/24   Ngetich, Dinah C, NP  Melatonin 10 MG TABS Take 10 mg by mouth at bedtime as needed (sleep).    [provider]  Menthol , Topical Analgesic, (ICY HOT MEDICATED SPRAY EX) Apply 1 application topically as needed (shoulder pain).    [provider]  metoprolol  tartrate (LOPRESSOR ) 50 MG tablet TAKE 1 TABLET BY MOUTH TWICE DAILY 06/12/24   Ngetich, Dinah C, NP  mirabegron  ER (MYRBETRIQ ) 50 MG TB24 tablet Take 1 tablet (50 mg total) by mouth daily. 03/20/24   Ngetich, Dinah C, NP  Multiple Vitamins-Minerals (MULTIVITAMIN ADULT PO) Take 1 tablet by mouth daily.    [provider]  nystatin  (MYCOSTATIN /NYSTOP ) powder Apply 1 Application topically 3 (three) times daily. 04/17/24   Ngetich, Dinah C, NP  nystatin  cream (MYCOSTATIN ) Apply 1 Application topically 3 (three) times daily. To rashes under bilateral breasts and bilateral groin  07/12/23   Medina-Vargas, Monina C, NP  omega-3 acid ethyl esters (LOVAZA ) 1 g capsule TAKE ONE CAPSULE BY MOUTH EVERY DAY 08/04/16   Reed, Tiffany L, DO  ondansetron  (ZOFRAN ) 4 MG tablet Take 1 tablet (4 mg total) by mouth every 8 (eight) hours as needed for nausea or vomiting. 07/24/23   Medina-Vargas, Monina C, NP  oxyCODONE -acetaminophen  (PERCOCET)  10-325 MG tablet Take 1 tablet by mouth every 4 (four) hours as needed for pain. 07/23/24   Debby Fidela CROME, NP  pantoprazole  (PROTONIX ) 40 MG tablet Take 1 tablet (40 mg total) by mouth daily. 06/26/23   Patel, Kunjan B, MD  polyvinyl alcohol  (LIQUIFILM TEARS) 1.4 % ophthalmic solution Place 1 drop into both eyes 4 (four) times daily as needed (for dry/irritated eyes).     [provider]  potassium chloride  SA (KLOR-CON  M) 20 MEQ tablet Take 2 tablets (40 mEq total) by mouth daily. 04/02/24   Ngetich, Dinah C, NP  promethazine  (PHENERGAN ) 12.5 MG tablet as needed. 11/17/16   [provider]  Semaglutide , 2 MG/DOSE, 8 MG/3ML SOPN Inject 2 mg as directed once a week. 01/10/24   Shamleffer, Ibtehal Jaralla, MD  sodium chloride  (OCEAN) 0.65 % nasal spray Place 2 sprays into the nose as needed for congestion. 12/09/20   Ngetich, Dinah C, NP  tiotropium (SPIRIVA ) 18 MCG inhalation capsule Place 18 mcg into inhaler and inhale as needed.    [provider]  tiZANidine  (ZANAFLEX ) 2 MG tablet TAKE 1 TABLET(2 MG) BY MOUTH THREE TIMES DAILY 05/14/24   Ngetich, Dinah C, NP  triamterene -hydrochlorothiazide  (MAXZIDE -25) 37.5-25 MG tablet TAKE 1 TABLET BY MOUTH DAILY 06/12/24   Ngetich, Dinah C, NP  venlafaxine  XR (EFFEXOR -XR) 75 MG 24 hr capsule TAKE 1 CAPSULE BY MOUTH EVERY DAY WITH BREAKFAST 03/18/24   Ngetich, Dinah C, NP  XELJANZ  5 MG TABS TAKE 1 TABLET BY MOUTH DAILY 04/23/24   Cheryl Waddell HERO, PA-C    Family History Family History  Problem Relation Age of Onset   Alzheimer's disease Mother    Heart disease Mother    Heart disease Father     Liver disease Father    Cancer Brother    Colon polyps Brother    Arthritis Son    Colon cancer Neg Hx    Esophageal cancer Neg Hx    Kidney disease Neg Hx    Stomach cancer Neg Hx    Rectal cancer Neg Hx     Social History Social History[1]   Allergies   Influenza vaccines, Penicillins, Statins, and Sulfamethoxazole -trimethoprim    Review of Systems Review of Systems  Constitutional:  Negative for fatigue and fever.  HENT:  Negative for congestion, rhinorrhea and sore throat.   Eyes:  Negative for pain and redness.  Respiratory:  Positive for shortness of breath. Negative for cough.   Cardiovascular:  Positive for palpitations. Negative for chest pain.  Gastrointestinal:  Negative for abdominal pain, diarrhea and vomiting.  Genitourinary:  Negative for dysuria.  Musculoskeletal:  Negative for myalgias.  Skin:  Negative for rash.  Neurological:  Positive for dizziness. Negative for headaches.  Psychiatric/Behavioral:  Negative for sleep disturbance.      Physical Exam Triage Vital Signs ED Triage Vitals  Encounter Vitals Group     BP 08/20/24 1255 (!) 140/79     Girls Systolic BP Percentile --      Girls Diastolic BP Percentile --      Boys Systolic BP Percentile --      Boys Diastolic BP Percentile --      Pulse Rate 08/20/24 1255 (!) 139     Resp 08/20/24 1255 18     Temp 08/20/24 1255 98.2 F (36.8 C)     Temp Source 08/20/24 1255 Oral     SpO2 08/20/24 1255 95 %     Weight --  Height --      Head Circumference --      Peak Flow --      Pain Score 08/20/24 1256 0     Pain Loc --      Pain Education --      Exclude from Growth Chart --    No data found.  Updated Vital Signs BP 97/64 (BP Location: Left Arm)   Pulse (!) 139   Temp 98.2 F (36.8 C) (Oral)   Resp 18   SpO2 95%   EKG:  supraventricular tachycardia, rate 139, no acute ST segment changes, compared with EKG dated 03/31/2021.  Physical Exam Vitals and nursing note reviewed.   Constitutional:      Appearance: Normal appearance.  HENT:     Head: Normocephalic.  Cardiovascular:     Rate and Rhythm: Regular rhythm. Tachycardia present.     Heart sounds: No murmur heard. Pulmonary:     Effort: Pulmonary effort is normal.     Breath sounds: Normal breath sounds. No wheezing, rhonchi or rales.  Abdominal:     General: Bowel sounds are normal.  Skin:    General: Skin is warm and dry.  Neurological:     General: No focal deficit present.     Mental Status: She is alert and oriented to person, place, and time.  Psychiatric:        Mood and Affect: Mood normal.        Behavior: Behavior normal.        Thought Content: Thought content normal.        Judgment: Judgment normal.    UC Treatments / Results  Labs (all labs ordered are listed, but only abnormal results are displayed) Labs Reviewed - No data to display   Radiology No results found.  Procedures Procedures (including critical care time)  Medications Ordered in UC Medications - No data to display  Initial Impression / Assessment and Plan / UC Course  I have reviewed the triage vital signs and the nursing notes.  Pertinent labs & imaging results that were available during my care of the patient were reviewed by me and considered in my medical decision making (see chart for details).    Patient presented for evaluations of palpitations, shortness of breath, tachycardia, and feeling near syncopal since yesterday.  Upon arrival to the urgent care, her heart rate was 139, SVT on EKG, and her blood pressure dropped to 97/64 with standing.  Decision made to transport to the emergency department via EMS due to the hypotension with standing, tachycardia, and patient reporting previous episodes of feeling near syncopal.  Transport to the emergency department via [facility]-report provided to attending [employer]. Final Clinical Impressions(s) / UC Diagnoses   Final diagnoses:  Tachycardia   Shortness of breath  Palpitations     Discharge Instructions      You have been referred to the Emergency Department via EMS due to elevated heart rate, blood pressure drop with standing, palpitations, shortness of breath, and feeling near syncopal.    ED Prescriptions   None    PDMP not reviewed this encounter.    [1]  Social History Tobacco Use   Smoking status: Former    Current packs/day: 0.00    Average packs/day: 0.1 packs/day for 10.0 years (1.0 ttl pk-yrs)    Types: Cigarettes    Start date: 12/06/1969    Quit date: 12/07/1979    Years since quitting: 44.7    Passive exposure:  Current   Smokeless tobacco: Never  Vaping Use   Vaping status: Never Used  Substance Use Topics   Alcohol  use: No    Alcohol /week: 0.0 standard drinks of alcohol    Drug use: No     Yoseline, Andersson, FNP 08/20/24 1324  "

## 2024-08-20 NOTE — ED Provider Notes (Signed)
 " [place] [facility] AT [facility] Provider Note   CSN: [id number] Arrival date & time: 08/20/24  1400     Patient presents with: Tachycardia   Katherine Delgado is a 83 y.o. female.   83 year old female history of COPD, hypertension, diabetes, fibromyalgia, and rheumatoid arthritis who presents to the emergency department tachycardia, shortness of breath, palpitations, and lightheadedness since yesterday.  Ran out of her metoprolol  4 days ago.  Yesterday said that she started feeling poorly.  She is having palpitations.  Also shortness of breath.  No chest pain.  Said that she felt like she was going to pass out and went to urgent care who found her to have a heart rate of 139 with an EKG that showed SVT.  Blood pressure did drop to 97/64 with standing.       Prior to Admission medications  Medication Sig Start Date End Date Taking? Authorizing Provider  amitriptyline  (ELAVIL ) 50 MG tablet Take 1 tablet (50 mg total) by mouth at bedtime. 12/14/21   Raulkar, Sven SQUIBB, MD  amLODipine  (NORVASC ) 10 MG tablet TAKE 1 TABLET(10 MG) BY MOUTH DAILY 03/18/24   Ngetich, Dinah C, NP  aspirin EC 81 MG tablet Take 81 mg by mouth 2 (two) times a week.    [provider]  augmented betamethasone  dipropionate (DIPROLENE -AF) 0.05 % cream Apply topically 2 (two) times daily as needed. 03/08/23   Ngetich, Dinah C, NP  cadexomer iodine  (IODOSORB) 0.9 % gel Apply 1 Application topically daily as needed for wound care. 09/07/23   Tobie Franky SQUIBB, DPM  cholecalciferol (VITAMIN D3) 25 MCG (1000 UT) tablet Take 2,000 Units by mouth daily.    [provider]  Continuous Glucose Sensor (DEXCOM G7 SENSOR) MISC 1 Device by Does not apply route as directed. 10/04/23   Shamleffer, Ibtehal Jaralla, MD  Cranberry 475 MG CAPS Take 1 capsule (475 mg total) by mouth 2 (two) times daily. 08/31/22   Ngetich, Dinah C, NP  docusate sodium  (COLACE) 100 MG capsule Take 100 mg by mouth daily as  needed for mild constipation.    [provider]  DULoxetine  (CYMBALTA ) 20 MG capsule TAKE 1 CAPSULE(20 MG) BY MOUTH DAILY 09/29/21   Raulkar, Sven SQUIBB, MD  ezetimibe  (ZETIA ) 10 MG tablet Take 1 tablet (10 mg total) by mouth daily. 06/08/21   Ngetich, Dinah C, NP  fluconazole  (DIFLUCAN ) 150 MG tablet Repeat in 72 hours 04/17/24   Ngetich, Dinah C, NP  fluticasone  (CUTIVATE ) 0.05 % cream Apply topically 2 (two) times daily as needed. 03/08/23   Ngetich, Dinah C, NP  fluticasone  (FLONASE ) 50 MCG/ACT nasal spray SHAKE LIQUID AND USE 2 SPRAYS IN EACH NOSTRIL AT BEDTIME 09/28/22   Landy Barnie RAMAN, NP  folic acid  (FOLVITE ) 1 MG tablet Take 2 tablets (2 mg total) by mouth daily. 02/01/22   Ngetich, Dinah C, NP  furosemide  (LASIX ) 40 MG tablet Take 1 tablet (40 mg total) by mouth daily. 04/02/24   Ngetich, Dinah C, NP  gabapentin  (NEURONTIN ) 600 MG tablet TAKE 1 TABLET(600 MG) BY MOUTH THREE TIMES DAILY 04/10/24   Ngetich, Dinah C, NP  insulin  glargine, 1 Unit Dial, (TOUJEO  SOLOSTAR) 300 UNIT/ML Solostar Pen 36 units QAM and 36 units  QPM 11/08/23   Shamleffer, Ibtehal Jaralla, MD  insulin  lispro (HUMALOG  KWIKPEN) 200 UNIT/ML KwikPen INJECT UP TO 30 UNITS DAILY AS DIRECTED PER MD 06/18/24   Shamleffer, Ibtehal Jaralla, MD  Insulin  Pen Needle (B-D UF III MINI PEN  NEEDLES) 31G X 5 MM MISC 1 Device by Other route in the morning, at noon, in the evening, and at bedtime. 04/03/23   Shamleffer, Donell Cardinal, MD  ipratropium-albuterol  (DUONEB) 0.5-2.5 (3) MG/3ML SOLN Take 3 mLs by nebulization every 6 (six) hours as needed. 04/02/24   Ngetich, Dinah C, NP  ketoconazole  (NIZORAL ) 2 % cream Apply 1 Application topically 2 (two) times daily. 03/08/23   Ngetich, Dinah C, NP  levothyroxine  (SYNTHROID ) 100 MCG tablet Take 1 tablet (100 mcg total) by mouth daily before breakfast. 04/05/24   Shamleffer, Ibtehal Jaralla, MD  lisinopril  (ZESTRIL ) 20 MG tablet TAKE 1 TABLET BY MOUTH EVERY DAY 01/10/24   Ngetich, Dinah C, NP   loperamide (IMODIUM A-D) 2 MG tablet Take 2 mg by mouth 4 (four) times daily as needed for diarrhea or loose stools.    [provider]  magnesium  oxide (MAG-OX) 400 MG tablet Take 1 tablet (400 mg total) by mouth daily. 04/02/24   Ngetich, Dinah C, NP  Melatonin 10 MG TABS Take 10 mg by mouth at bedtime as needed (sleep).    [provider]  Menthol , Topical Analgesic, (ICY HOT MEDICATED SPRAY EX) Apply 1 application topically as needed (shoulder pain).    [provider]  metoprolol  tartrate (LOPRESSOR ) 50 MG tablet TAKE 1 TABLET BY MOUTH TWICE DAILY 06/12/24   Ngetich, Dinah C, NP  mirabegron  ER (MYRBETRIQ ) 50 MG TB24 tablet Take 1 tablet (50 mg total) by mouth daily. 03/20/24   Ngetich, Dinah C, NP  Multiple Vitamins-Minerals (MULTIVITAMIN ADULT PO) Take 1 tablet by mouth daily.    [provider]  nystatin  (MYCOSTATIN /NYSTOP ) powder Apply 1 Application topically 3 (three) times daily. 04/17/24   Ngetich, Dinah C, NP  nystatin  cream (MYCOSTATIN ) Apply 1 Application topically 3 (three) times daily. To rashes under bilateral breasts and bilateral groin 07/12/23   Medina-Vargas, Monina C, NP  omega-3 acid ethyl esters (LOVAZA ) 1 g capsule TAKE ONE CAPSULE BY MOUTH EVERY DAY 08/04/16   Reed, Tiffany L, DO  ondansetron  (ZOFRAN ) 4 MG tablet Take 1 tablet (4 mg total) by mouth every 8 (eight) hours as needed for nausea or vomiting. 07/24/23   Medina-Vargas, Monina C, NP  oxyCODONE -acetaminophen  (PERCOCET) 10-325 MG tablet Take 1 tablet by mouth every 4 (four) hours as needed for pain. 07/23/24   Debby Fidela CROME, NP  pantoprazole  (PROTONIX ) 40 MG tablet Take 1 tablet (40 mg total) by mouth daily. 06/26/23   Patel, Kunjan B, MD  polyvinyl alcohol  (LIQUIFILM TEARS) 1.4 % ophthalmic solution Place 1 drop into both eyes 4 (four) times daily as needed (for dry/irritated eyes).     [provider]  potassium chloride  SA (KLOR-CON  M) 20 MEQ tablet Take 2 tablets (40 mEq  total) by mouth daily. 04/02/24   Ngetich, Dinah C, NP  promethazine  (PHENERGAN ) 12.5 MG tablet as needed. 11/17/16   [provider]  Semaglutide , 2 MG/DOSE, 8 MG/3ML SOPN Inject 2 mg as directed once a week. 01/10/24   Shamleffer, Ibtehal Jaralla, MD  sodium chloride  (OCEAN) 0.65 % nasal spray Place 2 sprays into the nose as needed for congestion. 12/09/20   Ngetich, Dinah C, NP  tiotropium (SPIRIVA ) 18 MCG inhalation capsule Place 18 mcg into inhaler and inhale as needed.    [provider]  tiZANidine  (ZANAFLEX ) 2 MG tablet TAKE 1 TABLET(2 MG) BY MOUTH THREE TIMES DAILY 05/14/24   Ngetich, Dinah C, NP  triamterene -hydrochlorothiazide  (MAXZIDE -25) 37.5-25 MG tablet TAKE 1 TABLET BY MOUTH  DAILY 06/12/24   Ngetich, Dinah C, NP  venlafaxine  XR (EFFEXOR -XR) 75 MG 24 hr capsule TAKE 1 CAPSULE BY MOUTH EVERY DAY WITH BREAKFAST 03/18/24   Ngetich, Dinah C, NP  XELJANZ  5 MG TABS TAKE 1 TABLET BY MOUTH DAILY 04/23/24   Dale, Taylor M, PA-C    Allergies: Influenza vaccines, Penicillins, Statins, and Sulfamethoxazole -trimethoprim     Review of Systems  Updated Vital Signs BP (!) 114/58   Pulse (!) 121   Temp 98.1 F (36.7 C) (Oral)   Resp 20   SpO2 94%   Physical Exam Vitals and nursing note reviewed.  Constitutional:      General: She is not in acute distress.    Appearance: She is well-developed.  HENT:     Head: Normocephalic and atraumatic.     Right Ear: External ear normal.     Left Ear: External ear normal.     Nose: Nose normal.  Eyes:     Extraocular Movements: Extraocular movements intact.     Conjunctiva/sclera: Conjunctivae normal.     Pupils: Pupils are equal, round, and reactive to light.  Cardiovascular:     Rate and Rhythm: Regular rhythm. Tachycardia present.     Heart sounds: No murmur heard. Pulmonary:     Effort: Pulmonary effort is normal. No respiratory distress.     Breath sounds: Rales (Bibasilar) present.  Abdominal:     General: Abdomen is flat.  There is no distension.     Palpations: Abdomen is soft. There is no mass.     Tenderness: There is no abdominal tenderness. There is no guarding.  Musculoskeletal:     Cervical back: Normal range of motion and neck supple.     Right lower leg: Edema (Trace) present.     Left lower leg: Edema (Trace) present.  Skin:    General: Skin is warm and dry.  Neurological:     Mental Status: She is alert and oriented to person, place, and time. Mental status is at baseline.  Psychiatric:        Mood and Affect: Mood normal.     (all labs ordered are listed, but only abnormal results are displayed) Labs Reviewed  CBC - Abnormal; Notable for the following components:      Result Value   WBC 16.5 (*)    RBC 5.46 (*)    HCT 46.4 (*)    All other components within normal limits  CBG MONITORING, ED - Abnormal; Notable for the following components:   Glucose-Capillary 174 (*)    All other components within normal limits  I-STAT CHEM 8, ED - Abnormal; Notable for the following components:   BUN 27 (*)    Creatinine, Ser 1.20 (*)    Glucose, Bld 165 (*)    Calcium , Ion 1.09 (*)    Hemoglobin 16.0 (*)    HCT 47.0 (*)    All other components within normal limits  RESP PANEL BY RT-PCR (RSV, FLU A&B, COVID)  [id number]  CULTURE, BLOOD (ROUTINE X 2)  CULTURE, BLOOD (ROUTINE X 2)  BASIC METABOLIC PANEL WITH GFR  HEPATIC FUNCTION PANEL  MAGNESIUM   PRO BRAIN NATRIURETIC PEPTIDE  D-DIMER, QUANTITATIVE  TSH  I-STAT CG4 LACTIC ACID, ED  TROPONIN T, HIGH SENSITIVITY    EKG: EKG Interpretation Date/Time:  Tuesday August 20 2024 14:46:15 EST Ventricular Rate:  122 PR Interval:    QRS Duration:  95 QT Interval:  320 QTC Calculation: 456 R Axis:   76  Text Interpretation:  Multifocal atrial tachycardia Confirmed by Yolande Charleston [phone]) on 08/20/2024 2:50:50 PM  Radiology: DG Chest Portable 1 View Result Date: 08/20/2024 EXAM: 1 VIEW(S) XRAY OF THE CHEST 08/20/2024 02:54:13 PM  COMPARISON: 03/31/2021. CLINICAL HISTORY: FINDINGS: LUNGS AND PLEURA: Linear densities in the right lung base, favor atelectasis or scarring. No pleural effusion. No pneumothorax. HEART AND MEDIASTINUM: Prior median sternotomy. Aortic atherosclerosis. No acute abnormality of the cardiac and mediastinal silhouettes. BONES AND SOFT TISSUES: Prior median sternotomy. No acute osseous abnormality. IMPRESSION: 1. Linear densities in the right lung base, favor atelectasis or scarring. 2. No active cardiopulmonary disease. Electronically signed by: Franky Crease MD 08/20/2024 03:39 PM EST RP Workstation: [id number]     Procedures   Medications Ordered in the ED  cefTRIAXone  (ROCEPHIN ) 2 g in sodium chloride  0.9 % 100 mL IVPB (has no administration in time range)  azithromycin  (ZITHROMAX ) 500 mg in sodium chloride  0.9 % 250 mL IVPB (has no administration in time range)  sodium chloride  0.9 % bolus 500 mL (500 mLs Intravenous New Bag/Given 08/20/24 1538)                                    Medical Decision Making Amount and/or Complexity of Data Reviewed Labs: ordered. Radiology: ordered.   Katherine Delgado is a 83 year old female history of COPD, hypertension, diabetes, fibromyalgia, and rheumatoid arthritis who presents to the emergency department tachycardia, shortness of breath, palpitations, and lightheadedness since yesterday.   Initial Ddx:  Rebound tachycardia, sepsis, URI, pneumonia, SVT, A-fib, PE  MDM/Course:  Patient presents to the emergency department with generalized weakness, shortness of breath, palpitations, and lightheadedness.  This is in the setting of missing her metoprolol  for several days.  Went to urgent care was also noted to be hypotensive.  On arrival was tachycardic.  Blood pressure in the 1 teens.  She is not in acute distress.  Does have trace lower extremity edema with bibasilar rales that are mild.  On room air at this point in time.  Suspect that she either is having  rebound tachycardia from missing her metoprolol  or potentially could be having a URI.  Will hold off on giving the metoprolol  now with her soft blood pressures and will hydrate her some.  X-ray with possible infiltrate in the right base and with the tachycardia and leukocytosis of 16 was given ceftriaxone  and azithromycin  in case of community-acquired pneumonia.  Upon re-evaluation patient remained tachycardic but hemodynamically stable otherwise.  Signed out to the oncoming physician Dr. Pamella awaiting the results of her blood work.  This patient presents to the ED for concern of complaints listed in HPI, this involves an extensive number of treatment options, and is a complaint that carries with it a high risk of complications and morbidity. Disposition including potential need for admission considered.   Dispo: Pending remainder of workup  I have reviewed the patients home medications and made adjustments as needed Records reviewed Outpatient Clinic Notes The following labs were independently interpreted: Chemistry and show no acute abnormality I independently reviewed the following imaging with scope of interpretation limited to determining acute life threatening conditions related to emergency care: Chest x-ray and agree with the radiologist interpretation with the following exceptions: none I personally reviewed and interpreted cardiac monitoring: Multifocal atrial tachycardia I personally reviewed and interpreted the pt's EKG: see above for interpretation  Social Determinants of health:  Geriatric   Portions of this  note were generated with Scientist, clinical (histocompatibility and immunogenetics). Dictation errors may occur despite best attempts at proofreading.      Final diagnoses:  Palpitations  Shortness of breath    ED Discharge Orders     None          Yolande Lamar BROCKS, MD 08/20/24 1607  "

## 2024-08-20 NOTE — ED Triage Notes (Signed)
 Pt here for palpitations and noted tachycardia starting yesterday; pt sts SOB noted and some fluctuation for her blood pressure and blood sugar

## 2024-08-20 NOTE — ED Notes (Signed)
Pt ambulated to bathroom using rolling walker.

## 2024-08-20 NOTE — ED Notes (Signed)
 CCMD called; Pt. Is on the monitor

## 2024-08-20 NOTE — ED Triage Notes (Signed)
 Pt. BIB [facility] from [facility] with c/o tachycardia; Per [facility] the pt. Has not been able to get her prescription of metoprolol  for 4 days due to the pharm. Being out. The pt. Has been having a HR 120-140 for these past couple of days.   VS per [facility]:  HR: 126 BP: 120/74 SpO2: 95%

## 2024-08-20 NOTE — ED Notes (Signed)
 Patient is being discharged from the Urgent Care and sent to the Emergency Department via EMS . Per AW, patient is in need of higher level of care due to tachycardia. Patient is aware and verbalizes understanding of plan of care.  Vitals:   08/20/24 1255 08/20/24 1303  BP: (!) 140/79 97/64  Pulse: (!) 139   Resp: 18   Temp: 98.2 F (36.8 C)   SpO2: 95%

## 2024-08-20 NOTE — Discharge Instructions (Addendum)
 You were seen in the emerged department for fast heart rate There was no evidence of pneumonia or blood clot in your lungs Your heart rate improved after dose of metoprolol  The reason your heart rate is fast is likely because you have not been taking the metoprolol  Continue taking this medication as previously prescribed with your first dose at home tomorrow morning Return to the emerged dept for chest pain fast heart rate or trouble breathing Otherwise follow-up with your primary doctor

## 2024-08-20 NOTE — Discharge Instructions (Addendum)
 You have been referred to the Emergency Department via EMS due to elevated heart rate, blood pressure drop with standing, palpitations, shortness of breath, and feeling near syncopal.

## 2024-08-21 NOTE — Progress Notes (Unsigned)
 "  Subjective:    Patient ID: Katherine Delgado, female    DOB: 02/01/1941, 83 y.o.   MRN: [id number]  HPI: Katherine Delgado is a 83 y.o. female who returns for follow up appointment for chronic pain and medication refill. She states her pain is located in her neck radiating into her bilateral shoulders, lower back radiating into her her buttocks and bilateral lower extremities. Also reports left hip pain, bilateral knees L>R, generalize joint pin and bilateral feet with tingling and burning.  She rates her pain 9. Her current exercise regime is walking short distances in her home with walkerand performing stretching exercises.  She went to Urgent care on 08/20/2024 for tachycardia, discharge summary was reviewed.   Katherine Delgado Morphine  equivalent is 90.00 MME.   Last Oral Swab was Performed on 05/24/2024, it was consistent.      Pain Inventory Average Pain 9 Pain Right Now 9 My pain is constant, sharp, burning, dull, stabbing, tingling, and aching  In the last 24 hours, has pain interfered with the following? General activity 10 Relation with others 10 Enjoyment of life 10 What TIME of day is your pain at its worst? morning  and night Sleep (in general) Poor  Pain is worse with: walking, bending, sitting, standing, and unsure Pain improves with: medication Relief from Meds: 4  Family History  Problem Relation Age of Onset   Alzheimer's disease Mother    Heart disease Mother    Heart disease Father    Liver disease Father    Cancer Brother    Colon polyps Brother    Arthritis Son    Colon cancer Neg Hx    Esophageal cancer Neg Hx    Kidney disease Neg Hx    Stomach cancer Neg Hx    Rectal cancer Neg Hx    Social History   Socioeconomic History   Marital status: Widowed    Spouse name: Not on file   Number of children: 2   Years of education: 14   Highest education level: Not on file  Occupational History   Occupation: Retired  Tobacco Use   Smoking status: Former     Current packs/day: 0.00    Average packs/day: 0.1 packs/day for 10.0 years (1.0 ttl pk-yrs)    Types: Cigarettes    Start date: 12/06/1969    Quit date: 12/07/1979    Years since quitting: 44.7    Passive exposure: Current   Smokeless tobacco: Never  Vaping Use   Vaping status: Never Used  Substance and Sexual Activity   Alcohol  use: No    Alcohol /week: 0.0 standard drinks of alcohol    Drug use: No   Sexual activity: Not Currently  Other Topics Concern   Not on file  Social History Narrative   Walks with cane   Right handed    Caffeine use: Coffee (2 cups every morning)   Tea: sometimes   Soda: none   Social Drivers of Health   Tobacco Use: Medium Risk (08/20/2024)   Patient History    Smoking Tobacco Use: Former    Smokeless Tobacco Use: Never    Passive Exposure: Current  Physicist, Medical Strain: Not on file  Food Insecurity: No Food Insecurity (10/25/2022)   Hunger Vital Sign    Worried About Running Out of Food in the Last Year: Never true    Ran Out of Food in the Last Year: Never true  Transportation Needs: No Transportation Needs (10/25/2022)   PRAPARE - Transportation  Lack of Transportation (Medical): No    Lack of Transportation (Non-Medical): No  Physical Activity: Not on file  Stress: Not on file  Social Connections: Not on file  Depression (PHQ2-9): Low Risk (06/24/2024)   Depression (PHQ2-9)    PHQ-2 Score: 0  Alcohol  Screen: Not on file  Housing: Low Risk (10/25/2022)   Housing    Last Housing Risk Score: 0  Utilities: Not on file  Health Literacy: Not on file   Past Surgical History:  Procedure Laterality Date   ABDOMINAL HYSTERECTOMY  1974   abdominal tumor  2002   APPENDECTOMY     APPLICATION OF A-CELL OF BACK N/A 09/26/2018   Procedure: With Acell;  Surgeon: Lowery Estefana RAMAN, DO;  Location: [facility];  Service: Plastics;  Laterality: N/A;   APPLICATION OF WOUND VAC N/A 09/26/2018   Procedure: Vac placement;  Surgeon: Lowery Estefana RAMAN, DO;   Location: [facility];  Service: Plastics;  Laterality: N/A;   BACK SURGERY  1982   BRONCHOSCOPY  2001   CATARACT EXTRACTION, BILATERAL  09/2019, 10/2019   CHOLECYSTECTOMY  1984   INCISION AND DRAINAGE OF WOUND N/A 09/26/2018   Procedure: Debridement of spine wound;  Surgeon: Lowery Estefana RAMAN, DO;  Location: [facility];  Service: Plastics;  Laterality: N/A;   KNEE SURGERY Bilateral 08/27/2010 (L) and 01/11/2011 (R)   LUMBAR LAMINECTOMY/DECOMPRESSION MICRODISCECTOMY N/A 07/03/2018   Procedure: Lumbar Three to Lumbar Five Laminectomy;  Surgeon: Cheryle Debby LABOR, MD;  Location: [facility];  Service: Neurosurgery;  Laterality: N/A;   LUMBAR WOUND DEBRIDEMENT N/A 08/20/2018   Procedure: POSTERIOR LUMBAR SPINAL WOUND DEBRIDEMENT AND REVISION;  Surgeon: Cheryle Debby LABOR, MD;  Location: [facility];  Service: Neurosurgery;  Laterality: N/A;  POSTERIOR LUMBAR SPINAL WOUND REVISION   miscarrage  1962   OVARIAN CYST SURGERY  1968   thymus tumor     thymus tumor  10/2000   TONSILLECTOMY     Past Surgical History:  Procedure Laterality Date   ABDOMINAL HYSTERECTOMY  1974   abdominal tumor  2002   APPENDECTOMY     APPLICATION OF A-CELL OF BACK N/A 09/26/2018   Procedure: With Acell;  Surgeon: Lowery Estefana RAMAN, DO;  [facility];  Service: Plastics;  Laterality: N/A;   APPLICATION OF WOUND VAC N/A 09/26/2018   Procedure: Vac placement;  Surgeon: Lowery Estefana RAMAN, DO;  [facility];  Service: Plastics;  Laterality: N/A;   BACK SURGERY  1982   BRONCHOSCOPY  2001   CATARACT EXTRACTION, BILATERAL  09/2019, 10/2019   CHOLECYSTECTOMY  1984   INCISION AND DRAINAGE OF WOUND N/A 09/26/2018   Procedure: Debridement of spine wound;  Surgeon: Lowery Estefana RAMAN, DO;  [facility];  Service: Plastics;  Laterality: N/A;   KNEE SURGERY Bilateral 08/27/2010 (L) and 01/11/2011 (R)   LUMBAR LAMINECTOMY/DECOMPRESSION MICRODISCECTOMY N/A 07/03/2018   Procedure: Lumbar Three to Lumbar Five Laminectomy;  Surgeon:  Cheryle Debby LABOR, MD;  [facility];  Service: Neurosurgery;  Laterality: N/A;   LUMBAR WOUND DEBRIDEMENT N/A 08/20/2018   Procedure: POSTERIOR LUMBAR SPINAL WOUND DEBRIDEMENT AND REVISION;  Surgeon: Cheryle Debby LABOR, MD;  [facility];  Service: Neurosurgery;  Laterality: N/A;  POSTERIOR LUMBAR SPINAL WOUND REVISION   miscarrage  1962   OVARIAN CYST SURGERY  1968   thymus tumor     thymus tumor  10/2000   TONSILLECTOMY     Past Medical History:  Diagnosis Date   Abnormality of gait 04/19/2016   Acute  infective polyneuritis 2002   Allergic rhinitis due to pollen    Chronic pain syndrome    COPD (chronic obstructive pulmonary disease) (HCC)    chronic bronchitis   Cystitis    Depressive disorder, not elsewhere classified    Diabetes mellitus without complication (HCC)    Diaphragmatic hernia without mention of obstruction or gangrene    Dyslipidemia    Fibromyalgia    GERD (gastroesophageal reflux disease)    with h/o esophagitis   Guillain-Barre syndrome    History of benign thymus tumor    Insomnia, unspecified    Lumbar spinal stenosis 03/30/2018   L4-5 level, severe   Miscarriage 1962   Mixed hyperlipidemia    Morbid obesity (HCC)    Osteoporosis    Pneumonia    Polyneuropathy in diabetes(357.2)    Rheumatoid arthritis(714.0)    Spondylosis, lumbosacral    Spontaneous ecchymoses    Type II or unspecified type diabetes mellitus with peripheral circulatory disorders, uncontrolled(250.72)    Unspecified chronic bronchitis (HCC)    Unspecified essential hypertension    Unspecified hypothyroidism    Unspecified pruritic disorder    Unspecified urinary incontinence    There were no vitals taken for this visit.  Opioid Risk Score:   Fall Risk Score:  `1  Depression screen [facility] 2/9     06/24/2024   11:23 AM 03/28/2024    9:25 AM 03/01/2024    8:57 AM 12/28/2023   10:47 AM 09/12/2023   10:25 AM 08/11/2023    9:27 AM 07/12/2023   10:59 AM  Depression screen  PHQ 2/9  Decreased Interest 0 1 0 0 0 0 0  Down, Depressed, Hopeless 0 1 0 0 0 0 1  PHQ - 2 Score 0 2 0 0 0 0 1    Review of Systems  Musculoskeletal:  Positive for back pain and gait problem.       Pain in both legs, knees, arms shoulders  All other systems reviewed and are negative.      Objective:   Physical Exam Vitals and nursing note reviewed.  Constitutional:      Appearance: Normal appearance.  Neck:     Comments: Cervical Paraspinal Tenderness: C-5-C-6 Cardiovascular:     Rate and Rhythm: Normal rate and regular rhythm.     Pulses: Normal pulses.     Heart sounds: Normal heart sounds.  Pulmonary:     Effort: Pulmonary effort is normal.     Breath sounds: Normal breath sounds.  Musculoskeletal:     Comments: Normal Muscle Bulk and Muscle Testing Reveals:  Upper Extremities: Right: Full ROM and Muscle Strength 5/5 Left Upper extremity: Decreased ROM 30 Degrees and Muscle Strength 4/5 Left AC Joint Tenderness Lumbar Paraspinal Tenderness: L-4-L-5 Left Greater Trochanter Tenderness Lower Extremities: Right: Full ROM and Muscle Strength 5/5 Left Lower extremity: Decreased ROM and Muscle Strength 5/5 Left Lower extremity Flexion produces pain into her left hip, left knee and left foot Arrived in wheelchair      Skin:    General: Skin is warm and dry.  Neurological:     Mental Status: She is alert and oriented to person, place, and time.  Psychiatric:        Mood and Affect: Mood normal.        Behavior: Behavior normal.          Assessment & Plan:  1..Failed Back Syndrome: Continue HEP as Tolerated. Continue to Monitor. 08/23/2024 2.Lumbar Radiculitis/ Chronic Low Back Pain without Sciatica:  Continue Gabapentin . Continue to monitor. Continue HEP as Tolerated 08/23/2024 3.Bilateral  Greater Trochanter Bursitis: .Continue to Alternate Ice and Heat Therapy. Continue current medication regimen. Continue to monitor. 05/23/2025 4.Bilateral Primary OA:/ Both Knees   Continue HEP as Tolerated. Continue to Monitor. 08/23/2024 5.Insomnia: Continue Amitriptyline  . Continue to Monitor.08/23/2024 6.Chronic Pain Syndrome: Refilled: Oxycodone  10/325 mg one tablet every 4 hours as needed for pain #180. We will continue the opioid monitoring program, this consists of regular clinic visits, examinations, urine drug screen, pill counts as well as use of [place]  Controlled Substance Reporting system. A 12 month History has been reviewed on the [place]  Controlled Substance Reporting System on 08/23/2024 7. Cervicalgia/ Cervical Radiculitis: Continue Gabapentin . Continue HEP as tolerated. Continue to monitor. 01/02//2026 8. Polyarthralgia: Continue HEP as tolerated. Continue to monitor. 08/23/2024 9. Polyneuropathy: Continue Gabapentin . Continue to Monitor. 08/23/2024   F/U in 1 month                     "

## 2024-08-23 ENCOUNTER — Encounter: Attending: Physical Medicine and Rehabilitation | Admitting: Registered Nurse

## 2024-08-23 ENCOUNTER — Encounter: Payer: Self-pay | Admitting: Registered Nurse

## 2024-08-23 VITALS — BP 127/70 | HR 65 | Ht 66.0 in

## 2024-08-23 DIAGNOSIS — G894 Chronic pain syndrome: Secondary | ICD-10-CM | POA: Insufficient documentation

## 2024-08-23 DIAGNOSIS — M25511 Pain in right shoulder: Secondary | ICD-10-CM | POA: Diagnosis not present

## 2024-08-23 DIAGNOSIS — M25512 Pain in left shoulder: Secondary | ICD-10-CM | POA: Diagnosis present

## 2024-08-23 DIAGNOSIS — M7062 Trochanteric bursitis, left hip: Secondary | ICD-10-CM | POA: Insufficient documentation

## 2024-08-23 DIAGNOSIS — M5416 Radiculopathy, lumbar region: Secondary | ICD-10-CM | POA: Diagnosis not present

## 2024-08-23 DIAGNOSIS — G8929 Other chronic pain: Secondary | ICD-10-CM | POA: Insufficient documentation

## 2024-08-23 DIAGNOSIS — M542 Cervicalgia: Secondary | ICD-10-CM | POA: Diagnosis not present

## 2024-08-23 DIAGNOSIS — M170 Bilateral primary osteoarthritis of knee: Secondary | ICD-10-CM | POA: Insufficient documentation

## 2024-08-23 DIAGNOSIS — G6289 Other specified polyneuropathies: Secondary | ICD-10-CM | POA: Diagnosis not present

## 2024-08-23 DIAGNOSIS — M961 Postlaminectomy syndrome, not elsewhere classified: Secondary | ICD-10-CM | POA: Diagnosis not present

## 2024-08-23 DIAGNOSIS — Z5181 Encounter for therapeutic drug level monitoring: Secondary | ICD-10-CM | POA: Diagnosis not present

## 2024-08-23 DIAGNOSIS — M2550 Pain in unspecified joint: Secondary | ICD-10-CM | POA: Diagnosis not present

## 2024-08-23 DIAGNOSIS — Z79891 Long term (current) use of opiate analgesic: Secondary | ICD-10-CM | POA: Insufficient documentation

## 2024-08-23 DIAGNOSIS — M5412 Radiculopathy, cervical region: Secondary | ICD-10-CM | POA: Diagnosis not present

## 2024-08-23 MED ORDER — OXYCODONE-ACETAMINOPHEN 10-325 MG PO TABS: 1.0000 | ORAL_TABLET | ORAL | 0 refills | Status: AC | PRN

## 2024-08-23 MED ORDER — OXYCODONE-ACETAMINOPHEN 10-325 MG PO TABS: 1.0000 | ORAL_TABLET | ORAL | 0 refills | Status: DC | PRN

## 2024-08-25 LAB — CULTURE, BLOOD (ROUTINE X 2)
Culture: NO GROWTH
Culture: NO GROWTH
Special Requests: ADEQUATE
Special Requests: ADEQUATE

## 2024-08-29 ENCOUNTER — Ambulatory Visit: Admitting: Family

## 2024-08-29 ENCOUNTER — Encounter: Payer: Self-pay | Admitting: Family

## 2024-08-29 VITALS — BP 122/62 | HR 67 | Temp 97.4°F | Resp 18 | Ht 66.0 in | Wt 216.6 lb

## 2024-08-29 DIAGNOSIS — B372 Candidiasis of skin and nail: Secondary | ICD-10-CM | POA: Diagnosis not present

## 2024-08-29 DIAGNOSIS — E1142 Type 2 diabetes mellitus with diabetic polyneuropathy: Secondary | ICD-10-CM | POA: Diagnosis not present

## 2024-08-29 DIAGNOSIS — Z794 Long term (current) use of insulin: Secondary | ICD-10-CM | POA: Diagnosis not present

## 2024-08-29 DIAGNOSIS — I10 Essential (primary) hypertension: Secondary | ICD-10-CM | POA: Diagnosis not present

## 2024-08-29 DIAGNOSIS — N390 Urinary tract infection, site not specified: Secondary | ICD-10-CM

## 2024-08-29 DIAGNOSIS — E039 Hypothyroidism, unspecified: Secondary | ICD-10-CM | POA: Diagnosis not present

## 2024-08-29 DIAGNOSIS — L210 Seborrhea capitis: Secondary | ICD-10-CM | POA: Diagnosis not present

## 2024-08-29 LAB — POCT URINE DIPSTICK
Bilirubin, UA: NEGATIVE
Blood, UA: NEGATIVE
Glucose, UA: NEGATIVE mg/dL
Ketones, POC UA: NEGATIVE mg/dL
Nitrite, UA: NEGATIVE
POC PROTEIN,UA: NEGATIVE
Spec Grav, UA: 1.02
Urobilinogen, UA: 0.2 U/dL
pH, UA: 6.5

## 2024-08-29 MED ORDER — KETOCONAZOLE 2 % EX SHAM: 1.0000 | MEDICATED_SHAMPOO | CUTANEOUS | 0 refills | Status: AC

## 2024-08-29 MED ORDER — NYSTATIN 100000 UNIT/GM EX CREA: 1.0000 | TOPICAL_CREAM | Freq: Three times a day (TID) | CUTANEOUS | 3 refills | Status: AC

## 2024-08-29 NOTE — Patient Instructions (Signed)
-   cleanse with saline ,pat dry and covered with foam dressing for extra protection and absorption.change dressing every 3 days

## 2024-08-30 LAB — URINE CULTURE
MICRO NUMBER:: 17443955
Result:: NO GROWTH
SPECIMEN QUALITY:: ADEQUATE

## 2024-08-30 NOTE — Progress Notes (Unsigned)
 "  Office Visit Note  Patient: Katherine Delgado             Date of Birth: [birth year]           MRN: [id number]             PCP: Leonarda Roxan BROCKS, NP Referring: Leonarda Roxan BROCKS, NP Visit Date: 09/13/2024 Occupation: Data Unavailable  Subjective:  No chief complaint on file.   History of Present Illness: Katherine Delgado is a 84 y.o. female ***     Activities of Daily Living:  Patient reports morning stiffness for *** {minute/hour:19697}.   Patient {ACTIONS;DENIES/REPORTS:21021675::Denies} nocturnal pain.  Difficulty dressing/grooming: {ACTIONS;DENIES/REPORTS:21021675::Denies} Difficulty climbing stairs: {ACTIONS;DENIES/REPORTS:21021675::Denies} Difficulty getting out of chair: {ACTIONS;DENIES/REPORTS:21021675::Denies} Difficulty using hands for taps, buttons, cutlery, and/or writing: {ACTIONS;DENIES/REPORTS:21021675::Denies}  No Rheumatology ROS completed.   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   Primary osteoarthritis of right knee 07/25/2019   Wound dehiscence 08/17/2018   Lumbar spinal stenosis 03/30/2018   Rheumatoid arthritis involving both wrists with positive rheumatoid factor (HCC) 06/14/2016   High risk medication use 06/14/2016   Sjogren's syndrome 06/14/2016   Primary osteoarthritis of both knees 06/14/2016   DDD (degenerative disc disease), lumbar 06/14/2016   Hypothyroidism 06/14/2016   Osteoporosis 06/14/2016   Abnormality of gait 04/19/2016   Type 2 diabetes mellitus with diabetic polyneuropathy, with long-term current use of insulin  (HCC) 11/27/2015   Diabetes mellitus without complication (HCC) 09/12/2013   Headache 07/31/2013    Sinus infection 07/31/2013   Chronic right-sided low back pain with right-sided sciatica 03/14/2013   Insomnia 12/06/2012   Nausea alone 12/06/2012   Diarrhea 12/06/2012   Urinary incontinence, urge 12/06/2012   Lumbosacral root lesions, not elsewhere classified 11/27/2012   Diabetic polyneuropathy associated with type 2 diabetes mellitus (HCC) 11/27/2012   EDEMA 05/04/2010   YEAST INFECTION 05/03/2010   Hyperlipidemia 05/03/2010   Major depression, recurrent 05/03/2010   History of peripheral neuropathy 05/03/2010   Essential hypertension 05/03/2010   ALLERGIC RHINITIS 05/03/2010   PNEUMONIA 05/03/2010   COPD (chronic obstructive pulmonary disease) (HCC) 05/03/2010   GERD 05/03/2010   Fibromyalgia 05/03/2010   DYSPNEA 05/03/2010   CHEST PAIN 05/03/2010    Past Medical History:  Diagnosis Date   Abnormality of gait 04/19/2016   Acute infective polyneuritis 2002   Allergic rhinitis due to pollen    Chronic pain syndrome    COPD (chronic obstructive pulmonary disease) (HCC)    chronic bronchitis   Cystitis    Depressive disorder, not elsewhere classified    Diabetes mellitus without complication (HCC)    Diaphragmatic hernia without mention of obstruction or gangrene    Dyslipidemia    Fibromyalgia    GERD (gastroesophageal reflux disease)    with h/o esophagitis   Guillain-Barre syndrome    History of benign thymus tumor    Insomnia, unspecified    Lumbar spinal stenosis 03/30/2018   L4-5 level, severe   Miscarriage 1962   Mixed hyperlipidemia    Morbid obesity (HCC)    Osteoporosis    Pneumonia    Polyneuropathy in diabetes(357.2)    Rheumatoid arthritis(714.0)    Spondylosis, lumbosacral    Spontaneous ecchymoses  Type II or unspecified type diabetes mellitus with peripheral circulatory disorders, uncontrolled(250.72)    Unspecified chronic bronchitis (HCC)    Unspecified essential hypertension    Unspecified hypothyroidism    Unspecified pruritic  disorder    Unspecified urinary incontinence     Family History  Problem Relation Age of Onset   Alzheimer's disease Mother    Heart disease Mother    Heart disease Father    Liver disease Father    Cancer Brother    Colon polyps Brother    Arthritis Son    Colon cancer Neg Hx    Esophageal cancer Neg Hx    Kidney disease Neg Hx    Stomach cancer Neg Hx    Rectal cancer Neg Hx    Past Surgical History:  Procedure Laterality Date   ABDOMINAL HYSTERECTOMY  1974   abdominal tumor  2002   APPENDECTOMY     APPLICATION OF A-CELL OF BACK N/A 09/26/2018   Procedure: With Acell;  Surgeon: Lowery Estefana RAMAN, DO;  Location: [facility];  Service: Plastics;  Laterality: N/A;   APPLICATION OF WOUND VAC N/A 09/26/2018   Procedure: Vac placement;  Surgeon: Lowery Estefana RAMAN, DO;  Location: [facility];  Service: Plastics;  Laterality: N/A;   BACK SURGERY  1982   BRONCHOSCOPY  2001   CATARACT EXTRACTION, BILATERAL  09/2019, 10/2019   CHOLECYSTECTOMY  1984   INCISION AND DRAINAGE OF WOUND N/A 09/26/2018   Procedure: Debridement of spine wound;  Surgeon: Lowery Estefana RAMAN, DO;  Location: [facility];  Service: Plastics;  Laterality: N/A;   KNEE SURGERY Bilateral 08/27/2010 (L) and 01/11/2011 (R)   LUMBAR LAMINECTOMY/DECOMPRESSION MICRODISCECTOMY N/A 07/03/2018   Procedure: Lumbar Three to Lumbar Five Laminectomy;  Surgeon: Cheryle Debby LABOR, MD;  Location: [facility];  Service: Neurosurgery;  Laterality: N/A;   LUMBAR WOUND DEBRIDEMENT N/A 08/20/2018   Procedure: POSTERIOR LUMBAR SPINAL WOUND DEBRIDEMENT AND REVISION;  Surgeon: Cheryle Debby LABOR, MD;  Location: [facility];  Service: Neurosurgery;  Laterality: N/A;  POSTERIOR LUMBAR SPINAL WOUND REVISION   miscarrage  1962   OVARIAN CYST SURGERY  1968   thymus tumor     thymus tumor  10/2000   TONSILLECTOMY     Social History[1] Social History   Social History Narrative   Walks with cane   Right handed    Caffeine use: Coffee (2 cups every morning)    Tea: sometimes   Soda: none     Immunization History  Administered Date(s) Administered   Influenza-Unspecified 06/10/2011   PFIZER(Purple Top)SARS-COV-2 Vaccination 11/13/2019, 11/30/2019   Pneumococcal Conjugate-13 07/07/2016   Pneumococcal Polysaccharide-23 11/21/2003, 10/05/2017   Tdap 05/28/2016     Objective: Vital Signs: There were no vitals taken for this visit.   Physical Exam   Musculoskeletal Exam: ***  CDAI Exam: CDAI Score: -- Patient Global: --; Provider Global: -- Swollen: --; Tender: -- Joint Exam 09/13/2024   No joint exam has been documented for this visit   There is currently no information documented on the homunculus. Go to the Rheumatology activity and complete the homunculus joint exam.  Investigation: No additional findings.  Imaging: CT Angio Chest PE W and/or Wo Contrast Result Date: 08/20/2024 CLINICAL DATA:  Shortness of breath and palpitations. EXAM: CT ANGIOGRAPHY CHEST WITH CONTRAST TECHNIQUE: Multidetector CT imaging of the chest was performed using the standard protocol during bolus administration of intravenous contrast. Multiplanar CT image reconstructions and MIPs were obtained to evaluate the vascular anatomy. RADIATION DOSE REDUCTION: This  exam was performed according to the departmental dose-optimization program which includes automated exposure control, adjustment of the mA and/or kV according to patient size and/or use of iterative reconstruction technique. CONTRAST:  60mL OMNIPAQUE  IOHEXOL  350 MG/ML SOLN COMPARISON:  Chest CTA dated 03/31/2021. Chest radiographs dated 08/20/2024 and 03/31/2021. FINDINGS: Cardiovascular: Normally opacified pulmonary arteries with no pulmonary arterial filling defects seen. Atheromatous calcifications, including the coronary arteries and aorta. Normal-sized heart. No pericardial effusion. Mediastinum/Nodes: Post thymectomy surgical clips. Subcentimeter thyroid  nodules not requiring imaging follow-up. No  enlarged lymph nodes. Unremarkable trachea and esophagus. Lungs/Pleura: Interval several small right upper lobe lung nodules, the largest measuring 4 mm in maximum diameter on image number 56/10. Additional very small perifissural lymph nodes on the right. Stable right lower lobe calcified granuloma. Mild linear atelectasis/scarring at both lung bases with progression. Mild centrilobular bullous changes. No airspace consolidation or pleural fluid. Upper Abdomen: Extensive, dense atheromatous arterial calcifications. Musculoskeletal: Thoracic spine degenerative changes. Review of the MIP images confirms the above findings. IMPRESSION: 1. No pulmonary emboli or acute abnormality. 2. Interval several small right upper lobe lung nodules, the largest measuring 4 mm in maximum diameter. Since these are new, recommend a follow-up chest CT without contrast in 6-12 months. 3. Mild changes of COPD. 4. Calcific coronary artery and aortic atherosclerosis. Aortic Atherosclerosis (ICD10-I70.0) and Emphysema (ICD10-J43.9). Electronically Signed   By: Elspeth Bathe M.D.   On: 08/20/2024 17:51   DG Chest Portable 1 View Result Date: 08/20/2024 EXAM: 1 VIEW(S) XRAY OF THE CHEST 08/20/2024 02:54:13 PM COMPARISON: 03/31/2021. CLINICAL HISTORY: FINDINGS: LUNGS AND PLEURA: Linear densities in the right lung base, favor atelectasis or scarring. No pleural effusion. No pneumothorax. HEART AND MEDIASTINUM: Prior median sternotomy. Aortic atherosclerosis. No acute abnormality of the cardiac and mediastinal silhouettes. BONES AND SOFT TISSUES: Prior median sternotomy. No acute osseous abnormality. IMPRESSION: 1. Linear densities in the right lung base, favor atelectasis or scarring. 2. No active cardiopulmonary disease. Electronically signed by: Franky Crease MD 08/20/2024 03:39 PM EST RP Workstation: [id number]    Recent Labs: Lab Results  Component Value Date   WBC 16.5 (H) 08/20/2024   HGB 16.0 (H) 08/20/2024   PLT 374 08/20/2024    NA 137 08/20/2024   K 3.9 08/20/2024   CL 100 08/20/2024   CO2 23 08/20/2024   GLUCOSE 165 (H) 08/20/2024   BUN 27 (H) 08/20/2024   CREATININE 1.20 (H) 08/20/2024   BILITOT 0.4 08/20/2024   ALKPHOS 109 08/20/2024   AST 20 08/20/2024   ALT 7 08/20/2024   PROT 7.6 08/20/2024   ALBUMIN 3.8 08/20/2024   CALCIUM  9.5 08/20/2024   GFRAA 65 02/12/2021   QFTBGOLDPLUS NEGATIVE 04/11/2024    Speciality Comments: No specialty comments available.  Procedures:  No procedures performed Allergies: Influenza vaccines, Penicillins, Statins, and Sulfamethoxazole -trimethoprim    Assessment / Plan:     Visit Diagnoses: No diagnosis found.  Orders: No orders of the defined types were placed in this encounter.  No orders of the defined types were placed in this encounter.   Face-to-face time spent with patient was *** minutes. Greater than 50% of time was spent in counseling and coordination of care.  Follow-Up Instructions: No follow-ups on file.   Daved JAYSON Gavel, CMA  Note - This record has been created using Animal nutritionist.  Chart creation errors have been sought, but may not always  have been located. Such creation errors do not reflect on  the standard of medical care.    [1]  Social History Tobacco Use   Smoking status: Former    Current packs/day: 0.00    Average packs/day: 0.1 packs/day for 10.0 years (1.0 ttl pk-yrs)    Types: Cigarettes    Start date: 12/06/1969    Quit date: 12/07/1979    Years since quitting: 44.7    Passive exposure: Current   Smokeless tobacco: Never  Vaping Use   Vaping status: Never Used  Substance Use Topics   Alcohol  use: No    Alcohol /week: 0.0 standard drinks of alcohol    Drug use: No   "

## 2024-09-02 ENCOUNTER — Ambulatory Visit: Payer: Self-pay | Admitting: Family

## 2024-09-05 ENCOUNTER — Other Ambulatory Visit: Payer: Self-pay | Admitting: Family

## 2024-09-05 DIAGNOSIS — I10 Essential (primary) hypertension: Secondary | ICD-10-CM

## 2024-09-05 NOTE — Telephone Encounter (Signed)
 Medium risk warning populated when attempting to refill medications, will send to provider for pending  review and approval

## 2024-09-07 ENCOUNTER — Other Ambulatory Visit: Payer: Self-pay | Admitting: Family

## 2024-09-07 DIAGNOSIS — F331 Major depressive disorder, recurrent, moderate: Secondary | ICD-10-CM

## 2024-09-07 DIAGNOSIS — E1151 Type 2 diabetes mellitus with diabetic peripheral angiopathy without gangrene: Secondary | ICD-10-CM

## 2024-09-08 NOTE — Progress Notes (Signed)
 "  Provider: Cordale Manera FNP-C   Dominica Kent, Roxan BROCKS, NP  Patient Care Team: Coley Kulikowski, Roxan BROCKS, NP as PCP - General (Family Medicine) Dolphus Reiter, MD (Rheumatology) Altheimer, Ozell, MD as Attending Physician (Endocrinology) Harden Jerona GAILS, MD as Consulting Physician (Orthopedic Surgery)  Extended Emergency Contact Information Primary Emergency Contact: [facility] Address: [address]          [place], [place] [address] [place] Home Phone: [phone] Relation: Son Secondary Emergency Contact: Jens Dorn JACOBS, [place] Phone: [phone] Relation: Son  Code Status: Full code Goals of care: Advanced Directive information    08/20/2024    2:15 PM  Advanced Directives  Does Patient Have a Medical Advance Directive? Yes     Chief Complaint  Patient presents with   Hospitalization Follow-up    Hospital follow up from  [facility]     History of Present Illness   Katherine Delgado is an 84 year old female with COPD, hypertension, and type 2 diabetes who presents for hospital follow up for tachycardia and shortness of breath.  She experienced tachycardia, shortness of breath, palpitations, and lightheadedness after running out of metoprolol  for four days. She felt poorly the day before visiting the emergency room, describing a sensation of impending syncope. No chest pain was noted. At urgent care, her heart rate was 139 bpm with an EKG showing sinus SVT, and her blood pressure dropped to 97/64 upon standing. An EKG in the emergency room showed a heart rate of 122 bpm. She received IV Rocephin  and azithromycin  for possible community-acquired pneumonia.  After returning home from the hospital, she rested for a couple of days and her symptoms improved. She noted fluctuations in her heart rate and low blood pressure, causing near-syncope. Similar symptoms have occurred before but not to this extent.  She  has a history of COPD, hypertension, type 2 diabetes, fibromyalgia, and rheumatoid arthritis. She was out of metoprolol  for three to four days due to pharmacy issues.  A CT scan did not show pneumonia, and lab work including troponin, magnesium , and liver function tests were normal. Her white blood cell count was elevated at 16.5, which has been elevated in the past without a clear reason. She has not taken antibiotics or prednisone  recently, which could have contributed to the elevated count.  She reported a yeast infection under her breast and in the groin area, unresolved despite various treatments. She experiences incontinence, which she believes may contribute to recurrent UTIs. She has not seen a urologist for these issues.  She has a pressure ulcer on her right buttock that has been present for several months. Various treatments have been tried without success. She reports that the ulcer bleeds if it gets stuck to her clothing.   Past Medical History:  Diagnosis Date   Abnormality of gait 04/19/2016   Acute infective polyneuritis 2002   Allergic rhinitis due to pollen    Chronic pain syndrome    COPD (chronic obstructive pulmonary disease) (HCC)    chronic bronchitis   Cystitis    Depressive disorder, not elsewhere classified    Diabetes mellitus without complication (HCC)    Diaphragmatic hernia without mention of obstruction or gangrene    Dyslipidemia    Fibromyalgia    GERD (gastroesophageal reflux disease)    with h/o esophagitis   Guillain-Barre syndrome    History of benign thymus tumor    Insomnia,  unspecified    Lumbar spinal stenosis 03/30/2018   L4-5 level, severe   Miscarriage 1962   Mixed hyperlipidemia    Morbid obesity (HCC)    Osteoporosis    Pneumonia    Polyneuropathy in diabetes(357.2)    Rheumatoid arthritis(714.0)    Spondylosis, lumbosacral    Spontaneous ecchymoses    Type II or unspecified type diabetes mellitus with peripheral circulatory  disorders, uncontrolled(250.72)    Unspecified chronic bronchitis (HCC)    Unspecified essential hypertension    Unspecified hypothyroidism    Unspecified pruritic disorder    Unspecified urinary incontinence    Past Surgical History:  Procedure Laterality Date   ABDOMINAL HYSTERECTOMY  1974   abdominal tumor  2002   APPENDECTOMY     APPLICATION OF A-CELL OF BACK N/A 09/26/2018   Procedure: With Acell;  Surgeon: Lowery Estefana RAMAN, DO;  Location: [facility] OR;  Service: Plastics;  Laterality: N/A;   APPLICATION OF WOUND VAC N/A 09/26/2018   Procedure: Vac placement;  Surgeon: Lowery Estefana RAMAN, DO;  Location: [facility] OR;  Service: Plastics;  Laterality: N/A;   BACK SURGERY  1982   BRONCHOSCOPY  2001   CATARACT EXTRACTION, BILATERAL  09/2019, 10/2019   CHOLECYSTECTOMY  1984   INCISION AND DRAINAGE OF WOUND N/A 09/26/2018   Procedure: Debridement of spine wound;  Surgeon: Lowery Estefana RAMAN, DO;  Location: [facility] OR;  Service: Plastics;  Laterality: N/A;   KNEE SURGERY Bilateral 08/27/2010 (L) and 01/11/2011 (R)   LUMBAR LAMINECTOMY/DECOMPRESSION MICRODISCECTOMY N/A 07/03/2018   Procedure: Lumbar Three to Lumbar Five Laminectomy;  Surgeon: Cheryle Debby LABOR, MD;  Location: [facility] OR;  Service: Neurosurgery;  Laterality: N/A;   LUMBAR WOUND DEBRIDEMENT N/A 08/20/2018   Procedure: POSTERIOR LUMBAR SPINAL WOUND DEBRIDEMENT AND REVISION;  Surgeon: Cheryle Debby LABOR, MD;  Location: [facility] OR;  Service: Neurosurgery;  Laterality: N/A;  POSTERIOR LUMBAR SPINAL WOUND REVISION   miscarrage  1962   OVARIAN CYST SURGERY  1968   thymus tumor     thymus tumor  10/2000   TONSILLECTOMY      Allergies[1]  Allergies as of 08/29/2024       Reactions   Influenza Vaccines Other (See Comments)   h/o Guillain Barre; dr's told me years ago never to take another flu shot as it could relapse Julee Birch; has to do with when vaccine being changed to H1N1 virus   Penicillins Hives, Itching, Swelling, Rash   Has  patient had a PCN reaction causing immediate rash, facial/tongue/throat swelling, SOB or lightheadedness with hypotension:Unknown Has patient had a PCN reaction causing severe rash involving mucus membranes or skin necrosis: Unknown Has patient had a PCN reaction that required hospitalization:Unknown Has patient had a PCN reaction occurring within the last 10 years: Unknown If all of the above answers are NO, then may proceed with Cephalosporin use.   Statins Other (See Comments)   Myopathy, transaminitis   Sulfamethoxazole -trimethoprim  Itching        Medication List        Accurate as of August 29, 2024 11:59 PM. If you have any questions, ask your nurse or doctor.          amitriptyline  50 MG tablet Commonly known as: ELAVIL  Take 1 tablet (50 mg total) by mouth at bedtime.   amLODipine  10 MG tablet Commonly known as: NORVASC  TAKE 1 TABLET(10 MG) BY MOUTH DAILY   artificial tears ophthalmic solution Place 1 drop into both eyes 4 (four) times daily as needed (for dry/irritated  eyes).   aspirin EC 81 MG tablet Take 81 mg by mouth 2 (two) times a week.   augmented betamethasone  dipropionate 0.05 % cream Commonly known as: DIPROLENE -AF Apply topically 2 (two) times daily as needed.   B-D UF III MINI PEN NEEDLES 31G X 5 MM Misc Generic drug: Insulin  Pen Needle 1 Device by Other route in the morning, at noon, in the evening, and at bedtime.   cholecalciferol 25 MCG (1000 UNIT) tablet Commonly known as: VITAMIN D3 Take 2,000 Units by mouth daily.   Cranberry 475 MG Caps Take 1 capsule (475 mg total) by mouth 2 (two) times daily.   Dexcom G7 Sensor Misc 1 Device by Does not apply route as directed.   docusate sodium  100 MG capsule Commonly known as: COLACE Take 100 mg by mouth daily as needed for mild constipation.   DULoxetine  20 MG capsule Commonly known as: CYMBALTA  TAKE 1 CAPSULE(20 MG) BY MOUTH DAILY   ezetimibe  10 MG tablet Commonly known as:  ZETIA  Take 1 tablet (10 mg total) by mouth daily.   fluconazole  150 MG tablet Commonly known as: DIFLUCAN  Repeat in 72 hours   fluticasone  0.05 % cream Commonly known as: CUTIVATE  Apply topically 2 (two) times daily as needed.   fluticasone  50 MCG/ACT nasal spray Commonly known as: FLONASE  SHAKE LIQUID AND USE 2 SPRAYS IN EACH NOSTRIL AT BEDTIME   folic acid  1 MG tablet Commonly known as: FOLVITE  Take 2 tablets (2 mg total) by mouth daily.   furosemide  40 MG tablet Commonly known as: LASIX  Take 1 tablet (40 mg total) by mouth daily.   gabapentin  600 MG tablet Commonly known as: NEURONTIN  TAKE 1 TABLET(600 MG) BY MOUTH THREE TIMES DAILY   HumaLOG  KwikPen 200 UNIT/ML KwikPen Generic drug: insulin  lispro INJECT UP TO 30 UNITS DAILY AS DIRECTED PER MD   ICY HOT MEDICATED SPRAY EX Apply 1 application topically as needed (shoulder pain).   Iodosorb 0.9 % gel Generic drug: cadexomer iodine  Apply 1 Application topically daily as needed for wound care.   ipratropium-albuterol  0.5-2.5 (3) MG/3ML Soln Commonly known as: DUONEB Take 3 mLs by nebulization every 6 (six) hours as needed.   ketoconazole  2 % cream Commonly known as: NIZORAL  Apply 1 Application topically 2 (two) times daily. What changed: Another medication with the same name was added. Make sure you understand how and when to take each. Changed by: Genie Wenke, NP   ketoconazole  2 % shampoo Commonly known as: NIZORAL  Apply 1 Application topically 2 (two) times a week. What changed: You were already taking a medication with the same name, and this prescription was added. Make sure you understand how and when to take each. Changed by: Roxan Plough, NP   levothyroxine  100 MCG tablet Commonly known as: SYNTHROID  Take 1 tablet (100 mcg total) by mouth daily before breakfast.   lisinopril  20 MG tablet Commonly known as: ZESTRIL  TAKE 1 TABLET BY MOUTH EVERY DAY   loperamide 2 MG tablet Commonly known as:  IMODIUM A-D Take 2 mg by mouth 4 (four) times daily as needed for diarrhea or loose stools.   magnesium  oxide 400 MG tablet Commonly known as: MAG-OX Take 1 tablet (400 mg total) by mouth daily.   Melatonin 10 MG Tabs Take 10 mg by mouth at bedtime as needed (sleep).   metoprolol  tartrate 50 MG tablet Commonly known as: LOPRESSOR  TAKE 1 TABLET BY MOUTH TWICE DAILY   mirabegron  ER 50 MG Tb24 tablet Commonly known as: Myrbetriq  Take  1 tablet (50 mg total) by mouth daily.   MULTIVITAMIN ADULT PO Take 1 tablet by mouth daily.   nystatin  powder Commonly known as: MYCOSTATIN /NYSTOP  Apply 1 Application topically 3 (three) times daily.   nystatin  cream Commonly known as: MYCOSTATIN  Apply 1 Application topically 3 (three) times daily. To rashes under bilateral breasts and bilateral groin   omega-3 acid ethyl esters 1 g capsule Commonly known as: LOVAZA  TAKE ONE CAPSULE BY MOUTH EVERY DAY   ondansetron  4 MG tablet Commonly known as: Zofran  Take 1 tablet (4 mg total) by mouth every 8 (eight) hours as needed for nausea or vomiting.   oxyCODONE -acetaminophen  10-325 MG tablet Commonly known as: Percocet Take 1 tablet by mouth every 4 (four) hours as needed for pain.   pantoprazole  40 MG tablet Commonly known as: PROTONIX  Take 1 tablet (40 mg total) by mouth daily.   potassium chloride  SA 20 MEQ tablet Commonly known as: KLOR-CON  M Take 2 tablets (40 mEq total) by mouth daily.   promethazine  12.5 MG tablet Commonly known as: PHENERGAN  as needed.   Semaglutide  (2 MG/DOSE) 8 MG/3ML Sopn Inject 2 mg as directed once a week.   sodium chloride  0.65 % nasal spray Commonly known as: OCEAN Place 2 sprays into the nose as needed for congestion.   tiotropium 18 MCG inhalation capsule Commonly known as: SPIRIVA  Place 18 mcg into inhaler and inhale as needed.   tiZANidine  2 MG tablet Commonly known as: ZANAFLEX  TAKE 1 TABLET(2 MG) BY MOUTH THREE TIMES DAILY   Toujeo  SoloStar  300 UNIT/ML Solostar Pen Generic drug: insulin  glargine (1 Unit Dial) 36 units QAM and 36 units  QPM   triamterene -hydrochlorothiazide  37.5-25 MG tablet Commonly known as: MAXZIDE -25 TAKE 1 TABLET BY MOUTH DAILY   venlafaxine  XR 75 MG 24 hr capsule Commonly known as: EFFEXOR -XR TAKE 1 CAPSULE BY MOUTH EVERY DAY WITH BREAKFAST   Xeljanz  5 MG Tabs Generic drug: Tofacitinib  Citrate TAKE 1 TABLET BY MOUTH DAILY        Review of Systems  Constitutional:  Negative for appetite change, chills, fatigue, fever and unexpected weight change.  HENT:  Negative for congestion, dental problem, ear discharge, ear pain, facial swelling, hearing loss, nosebleeds, postnasal drip, rhinorrhea, sinus pressure, sinus pain, sneezing, sore throat, tinnitus and trouble swallowing.   Eyes:  Negative for pain, discharge, redness, itching and visual disturbance.  Respiratory:  Negative for cough, chest tightness, shortness of breath and wheezing.   Cardiovascular:  Negative for chest pain, palpitations and leg swelling.  Gastrointestinal:  Negative for abdominal distention, abdominal pain, blood in stool, constipation, diarrhea, nausea and vomiting.  Endocrine: Negative for cold intolerance, heat intolerance, polydipsia, polyphagia and polyuria.  Genitourinary:  Positive for frequency. Negative for difficulty urinating, dysuria, flank pain and urgency.  Musculoskeletal:  Positive for gait problem. Negative for arthralgias, back pain, joint swelling, myalgias, neck pain and neck stiffness.  Skin:  Negative for color change, pallor, rash and wound.       dandruff  Neurological:  Negative for dizziness, syncope, speech difficulty, weakness, light-headedness, numbness and headaches.  Hematological:  Does not bruise/bleed easily.  Psychiatric/Behavioral:  Negative for agitation, behavioral problems, confusion, hallucinations, self-injury, sleep disturbance and suicidal ideas. The patient is not nervous/anxious.      Immunization History  Administered Date(s) Administered   Influenza-Unspecified 06/10/2011   PFIZER(Purple Top)SARS-COV-2 Vaccination 11/13/2019, 11/30/2019   Pneumococcal Conjugate-13 07/07/2016   Pneumococcal Polysaccharide-23 11/21/2003, 10/05/2017   Tdap 05/28/2016   Pertinent  Health Maintenance Due  Topic Date Due   FOOT EXAM  03/07/2024   OPHTHALMOLOGY EXAM  06/11/2024   HEMOGLOBIN A1C  10/03/2024   Bone Density Scan  09/20/2025   Influenza Vaccine  Discontinued      03/01/2024    8:56 AM 03/28/2024    9:25 AM 04/26/2024    9:49 AM 06/24/2024   11:23 AM 08/23/2024   10:48 AM  Fall Risk  Falls in the past year? 0 0 0 0 0  Was there an injury with Fall? 0  0   0  0  Fall Risk Category Calculator 0 0  0 0     Data saved with a previous flowsheet row definition   Functional Status Survey:    Vitals:   08/29/24 1314  BP: 122/62  Pulse: 67  Resp: 18  Temp: (!) 97.4 F (36.3 C)  SpO2: 98%  Weight: 216 lb 9.6 oz (98.2 kg)  Height: 5' 6 (1.676 m)   Body mass index is 34.96 kg/m. Physical Exam  GENERAL: Alert, cooperative, well developed, no acute distress. HEENT: Normocephalic, normal oropharynx, moist mucous membranes. CHEST: Clear to auscultation bilaterally, no wheezes, rhonchi, or crackles. CARDIOVASCULAR: Normal heart rate and rhythm, S1 and S2 normal without murmurs. ABDOMEN: Soft, non-tender, non-distended, without organomegaly, normal bowel sounds. EXTREMITIES: No cyanosis or edema. NEUROLOGICAL: Cranial nerves grossly intact, moves all extremities without gross motor or sensory deficit. SKIN: Stage 2 pressure ulcer on right gluteal region.No rash,no lesion or erythema   PSYCHIATRY/BEHAVIORAL: Mood stable   Labs reviewed: Recent Labs    11/09/23 1530 04/11/24 0915 08/20/24 1525 08/20/24 1537  NA 139 141 136 137  K 4.5 4.2 4.0 3.9  CL 102 103 98 100  CO2 28 27 23   --   GLUCOSE 126* 71 166* 165*  BUN 23 22 25* 27*  CREATININE 1.17* 1.01* 1.18*  1.20*  CALCIUM  9.2 9.4 9.5  --   MG  --   --  2.1  --    Recent Labs    11/09/23 1530 04/11/24 0915 08/20/24 1525  AST 25 12 20   ALT 19 9 7   ALKPHOS  --   --  109  BILITOT 0.4 0.3 0.4  PROT 6.8 6.9  7.1 7.6  ALBUMIN  --   --  3.8   Recent Labs    11/09/23 1530 04/11/24 0915 08/20/24 1525 08/20/24 1537  WBC 14.5* 13.7* 16.5*  --   NEUTROABS 10,962* 9,206*  --   --   HGB 14.0 14.4 14.9 16.0*  HCT 43.4 46.1* 46.4* 47.0*  MCV 85.4 88.7 85.0  --   PLT 331 437* 374  --    Lab Results  Component Value Date   TSH 0.822 08/20/2024   Lab Results  Component Value Date   HGBA1C 7.1 (A) 04/02/2024   Lab Results  Component Value Date   CHOL 147 11/09/2023   HDL 40 (L) 11/09/2023   LDLCALC 77 11/09/2023   TRIG 206 (H) 11/09/2023   CHOLHDL 3.7 11/09/2023    Significant Diagnostic Results in last 30 days:  CT Angio Chest PE W and/or Wo Contrast Result Date: 08/20/2024 CLINICAL DATA:  Shortness of breath and palpitations. EXAM: CT ANGIOGRAPHY CHEST WITH CONTRAST TECHNIQUE: Multidetector CT imaging of the chest was performed using the standard protocol during bolus administration of intravenous contrast. Multiplanar CT image reconstructions and MIPs were obtained to evaluate the vascular anatomy. RADIATION DOSE REDUCTION: This exam was performed according to the departmental dose-optimization program which includes automated  exposure control, adjustment of the mA and/or kV according to patient size and/or use of iterative reconstruction technique. CONTRAST:  60mL OMNIPAQUE  IOHEXOL  350 MG/ML SOLN COMPARISON:  Chest CTA dated 03/31/2021. Chest radiographs dated 08/20/2024 and 03/31/2021. FINDINGS: Cardiovascular: Normally opacified pulmonary arteries with no pulmonary arterial filling defects seen. Atheromatous calcifications, including the coronary arteries and aorta. Normal-sized heart. No pericardial effusion. Mediastinum/Nodes: Post thymectomy surgical clips. Subcentimeter thyroid   nodules not requiring imaging follow-up. No enlarged lymph nodes. Unremarkable trachea and esophagus. Lungs/Pleura: Interval several small right upper lobe lung nodules, the largest measuring 4 mm in maximum diameter on image number 56/10. Additional very small perifissural lymph nodes on the right. Stable right lower lobe calcified granuloma. Mild linear atelectasis/scarring at both lung bases with progression. Mild centrilobular bullous changes. No airspace consolidation or pleural fluid. Upper Abdomen: Extensive, dense atheromatous arterial calcifications. Musculoskeletal: Thoracic spine degenerative changes. Review of the MIP images confirms the above findings. IMPRESSION: 1. No pulmonary emboli or acute abnormality. 2. Interval several small right upper lobe lung nodules, the largest measuring 4 mm in maximum diameter. Since these are new, recommend a follow-up chest CT without contrast in 6-12 months. 3. Mild changes of COPD. 4. Calcific coronary artery and aortic atherosclerosis. Aortic Atherosclerosis (ICD10-I70.0) and Emphysema (ICD10-J43.9). Electronically Signed   By: Elspeth Bathe M.D.   On: 08/20/2024 17:51   DG Chest Portable 1 View Result Date: 08/20/2024 EXAM: 1 VIEW(S) XRAY OF THE CHEST 08/20/2024 02:54:13 PM COMPARISON: 03/31/2021. CLINICAL HISTORY: FINDINGS: LUNGS AND PLEURA: Linear densities in the right lung base, favor atelectasis or scarring. No pleural effusion. No pneumothorax. HEART AND MEDIASTINUM: Prior median sternotomy. Aortic atherosclerosis. No acute abnormality of the cardiac and mediastinal silhouettes. BONES AND SOFT TISSUES: Prior median sternotomy. No acute osseous abnormality. IMPRESSION: 1. Linear densities in the right lung base, favor atelectasis or scarring. 2. No active cardiopulmonary disease. Electronically signed by: Franky Crease MD 08/20/2024 03:39 PM EST RP Workstation: [id number]    Assessment/Plan  Supraventricular tachycardia Status post hospitalization due  Rebound tachycardia likely due to missed metoprolol  doses for four days. Heart rate was 139 bpm with sinus SVT on EKG. Hemodynamically stable after treatment. No evidence of myocardial infarction or pulmonary embolism. Leukocytosis noted, possibly due to infection or stress response. - Ensure metoprolol  is refilled and taken regularly. - Recheck white blood cell count to monitor leukocytosis. - Tachycardia resolved   Essential hypertension Blood pressure fluctuations noted, likely related to missed metoprolol  doses. Blood pressure was 97/64 mmHg during the episode. - Ensure metoprolol  is refilled and taken regularly.  Candidal intertrigo Recurrent yeast infection under the breast and in the groin area. Previous treatments have been ineffective. - Prescribed nystatin  cream for application to affected areas.  Recurrent urinary tract infection with urinary incontinence Recurrent UTIs with urinary incontinence. Symptoms include burning and pain during urination. No prior urology consultation. - Obtained urine specimen for culture and sensitivity. - Referred to urologist for further evaluation of recurrent UTIs and incontinence.  Stage 2 pressure ulcer of right buttock Stage 2 pressure ulcer on the right buttock, approximately 2 cm by 1 cm. Likely exacerbated by incontinence and prolonged pressure. - Apply foam dressing to the ulcer and change every three days or as needed if soiled. - Use desiccant to protect the skin.  Seborrheic dermatitis of scalp Intermittent seborrheic dermatitis of the scalp, presenting as itchy and flaky skin. - Prescribed medicated shampoo for scalp treatment.   Family/ staff Communication: Reviewed plan of care with patient verbalized  understanding  Labs/tests ordered:  - Urine Culture; Future - POC Urinalysis Dipstick  Next Appointment : Return if symptoms worsen or fail to improve.  Spent 30 minutes of Face to face and non-face to face with patient  >50%  time spent counseling; reviewing medical record; tests; labs; documentation and developing future plan of care.  Marley Pakula C Malicia Blasdel, NP      [1]  Allergies Allergen Reactions   Influenza Vaccines Other (See Comments)    h/o Guillain Barre; dr's told me years ago never to take another flu shot as it could relapse Julee Birch; has to do with when vaccine being changed to H1N1 virus   Penicillins Hives, Itching, Swelling and Rash    Has patient had a PCN reaction causing immediate rash, facial/tongue/throat swelling, SOB or lightheadedness with hypotension:Unknown Has patient had a PCN reaction causing severe rash involving mucus membranes or skin necrosis: Unknown Has patient had a PCN reaction that required hospitalization:Unknown Has patient had a PCN reaction occurring within the last 10 years: Unknown If all of the above answers are NO, then may proceed with Cephalosporin use.    Statins Other (See Comments)    Myopathy, transaminitis   Sulfamethoxazole -Trimethoprim  Itching   "

## 2024-09-09 NOTE — Telephone Encounter (Signed)
 High risk warning populated when attempting to refill medications, will send to provider for pending  review and approval

## 2024-09-13 ENCOUNTER — Ambulatory Visit: Admitting: Rheumatology

## 2024-09-13 DIAGNOSIS — M51369 Other intervertebral disc degeneration, lumbar region without mention of lumbar back pain or lower extremity pain: Secondary | ICD-10-CM

## 2024-09-13 DIAGNOSIS — Z8659 Personal history of other mental and behavioral disorders: Secondary | ICD-10-CM

## 2024-09-13 DIAGNOSIS — Z8709 Personal history of other diseases of the respiratory system: Secondary | ICD-10-CM

## 2024-09-13 DIAGNOSIS — Z8669 Personal history of other diseases of the nervous system and sense organs: Secondary | ICD-10-CM

## 2024-09-13 DIAGNOSIS — M810 Age-related osteoporosis without current pathological fracture: Secondary | ICD-10-CM

## 2024-09-13 DIAGNOSIS — M3501 Sicca syndrome with keratoconjunctivitis: Secondary | ICD-10-CM

## 2024-09-13 DIAGNOSIS — G4709 Other insomnia: Secondary | ICD-10-CM

## 2024-09-13 DIAGNOSIS — Z8719 Personal history of other diseases of the digestive system: Secondary | ICD-10-CM

## 2024-09-13 DIAGNOSIS — E785 Hyperlipidemia, unspecified: Secondary | ICD-10-CM

## 2024-09-13 DIAGNOSIS — Z8679 Personal history of other diseases of the circulatory system: Secondary | ICD-10-CM

## 2024-09-13 DIAGNOSIS — Z8639 Personal history of other endocrine, nutritional and metabolic disease: Secondary | ICD-10-CM

## 2024-09-13 DIAGNOSIS — M0579 Rheumatoid arthritis with rheumatoid factor of multiple sites without organ or systems involvement: Secondary | ICD-10-CM

## 2024-09-13 DIAGNOSIS — M170 Bilateral primary osteoarthritis of knee: Secondary | ICD-10-CM

## 2024-09-13 DIAGNOSIS — M797 Fibromyalgia: Secondary | ICD-10-CM

## 2024-09-13 DIAGNOSIS — B029 Zoster without complications: Secondary | ICD-10-CM

## 2024-09-13 DIAGNOSIS — Z79899 Other long term (current) drug therapy: Secondary | ICD-10-CM

## 2024-09-23 ENCOUNTER — Ambulatory Visit: Admitting: Internal Medicine

## 2024-09-26 ENCOUNTER — Encounter: Payer: Self-pay | Admitting: Internal Medicine

## 2024-09-26 ENCOUNTER — Telehealth: Admitting: Internal Medicine

## 2024-09-26 NOTE — Progress Notes (Unsigned)
 "   Name: Katherine Delgado  Age/ Sex: 84 y.o., female   MRN/ DOB: [id number], 30-Dec-1940     PCP: Leonarda Roxan BROCKS, NP   Reason for Endocrinology Evaluation: Type 2 Diabetes Mellitus/ hypothyriodism  Initial Endocrine Consultative Visit: 10/14/2020    Katherine Delgado IDENTIFIER: Katherine Delgado is a 84 y.o. female with a past medical history of HTN, DM, RA, failed back syndrome and Hypothyroidism.. The Katherine Delgado has followed with Endocrinology clinic since 10/14/2020 for consultative assistance with management of her diabetes.      DIABETIC HISTORY:  Katherine Delgado was diagnosed with DM many years ago. Her hemoglobin A1c has ranged from Her A1c was ranged from 6.6% in 2019 to 10.3 % in 2022.     On her initial visit to our clinic her A1c was 10.3% .We continued MDI regimen , victoza  and started Farxiga  and was provided with a correction scale . A dexcom prescription was sent   Farxiga  was discontinued due to severe genital irritation    Switch Victoza  to Ozempic  02/2022   THYROID  HISTORY: Katherine Delgado has been on LT-4 replacement for years. Was noted to have a consistently low TSH since 05/2020. Per pt has been diagnosed with MNG in the past. No prior sx or biopsies. Katherine Delgado was  On Levothyroxine  125 mcg daily which we reduced to 112 mcg daily   SUBJECTIVE:   During the last visit (04/02/2024): A1c 7.1%   Today (09/26/2024): Katherine Delgado is here for a follow up on diabetes and hypothyroidism . Katherine Delgado checks her blood sugars multiple times a day through CGM. The Katherine Delgado has had hypoglycemic episodes since the last clinic visit, Katherine Delgado is symptomatic.   Katherine Delgado presented to the ED in December, 2025 with palpitation, CTA showed no evidence of pneumonia or PE.  Was attributed to missing metoprolol  doses. Katherine Delgado continues to follow-up with rheumatology for rheumatoid arthritis  Katherine Delgado has cold intolerance as well as fatigue  Has hair loss  Has noted LE edema , Katherine Delgado had a follow-up with the PCP for this recently and was  prescribed antibiotics Denies  vomiting  Continues with occasional constipation  HOME ENDOCRINE REGIMEN:  Toujeo  36 units BID Humalog  5 units with each meal  Ozempic  2 mg weekly  CF: Humalog  ( BG- 140/35)  Levothyroxine  100 mcg daily     CONTINUOUS GLUCOSE MONITORING RECORD INTERPRETATION    Dates of Recording: 7/30-8/07/2024  Sensor description:dexcom  Results statistics:   CGM use % of time 88  Average and SD 190/63  Time in range 42%  % Time Above 180 40  % Time above 250 17  % Time Below target <1    Glycemic patterns summary: BGs fluctuate throughout the day and night  Hypoglycemic episodes occurred after bolus  Overnight periods: Variable   DIABETIC COMPLICATIONS: Microvascular complications:  neuropathy Denies: CKD, retinopathy Last Eye Exam: Completed 03/25/2021  Macrovascular complications:   Denies: CAD, CVA, PVD   HISTORY:  Past Medical History:  Past Medical History:  Diagnosis Date   Abnormality of gait 04/19/2016   Acute infective polyneuritis 2002   Allergic rhinitis due to pollen    Chronic pain syndrome    COPD (chronic obstructive pulmonary disease) (HCC)    chronic bronchitis   Cystitis    Depressive disorder, not elsewhere classified    Diabetes mellitus without complication (HCC)    Diaphragmatic hernia without mention of obstruction or gangrene    Dyslipidemia    Fibromyalgia    GERD (gastroesophageal reflux disease)  with h/o esophagitis   Guillain-Barre syndrome    History of benign thymus tumor    Insomnia, unspecified    Lumbar spinal stenosis 03/30/2018   L4-5 level, severe   Miscarriage 1962   Mixed hyperlipidemia    Morbid obesity (HCC)    Osteoporosis    Pneumonia    Polyneuropathy in diabetes(357.2)    Rheumatoid arthritis(714.0)    Spondylosis, lumbosacral    Spontaneous ecchymoses    Type II or unspecified type diabetes mellitus with peripheral circulatory disorders, uncontrolled(250.72)    Unspecified  chronic bronchitis (HCC)    Unspecified essential hypertension    Unspecified hypothyroidism    Unspecified pruritic disorder    Unspecified urinary incontinence    Past Surgical History:  Past Surgical History:  Procedure Laterality Date   ABDOMINAL HYSTERECTOMY  1974   abdominal tumor  2002   APPENDECTOMY     APPLICATION OF A-CELL OF BACK N/A 09/26/2018   Procedure: With Acell;  Surgeon: Lowery Estefana RAMAN, DO;  Location: [facility] OR;  Service: Plastics;  Laterality: N/A;   APPLICATION OF WOUND VAC N/A 09/26/2018   Procedure: Vac placement;  Surgeon: Lowery Estefana RAMAN, DO;  Location: [facility] OR;  Service: Plastics;  Laterality: N/A;   BACK SURGERY  1982   BRONCHOSCOPY  2001   CATARACT EXTRACTION, BILATERAL  09/2019, 10/2019   CHOLECYSTECTOMY  1984   INCISION AND DRAINAGE OF WOUND N/A 09/26/2018   Procedure: Debridement of spine wound;  Surgeon: Lowery Estefana RAMAN, DO;  Location: [facility] OR;  Service: Plastics;  Laterality: N/A;   KNEE SURGERY Bilateral 08/27/2010 (L) and 01/11/2011 (R)   LUMBAR LAMINECTOMY/DECOMPRESSION MICRODISCECTOMY N/A 07/03/2018   Procedure: Lumbar Three to Lumbar Five Laminectomy;  Surgeon: Cheryle Debby LABOR, MD;  Location: [facility] OR;  Service: Neurosurgery;  Laterality: N/A;   LUMBAR WOUND DEBRIDEMENT N/A 08/20/2018   Procedure: POSTERIOR LUMBAR SPINAL WOUND DEBRIDEMENT AND REVISION;  Surgeon: Cheryle Debby LABOR, MD;  Location: [facility];  Service: Neurosurgery;  Laterality: N/A;  POSTERIOR LUMBAR SPINAL WOUND REVISION   miscarrage  1962   OVARIAN CYST SURGERY  1968   thymus tumor     thymus tumor  10/2000   TONSILLECTOMY     Social History:  reports that Katherine Delgado quit smoking about 44 years ago. Her smoking use included cigarettes. Katherine Delgado started smoking about 54 years ago. Katherine Delgado has a 1 pack-year smoking history. Katherine Delgado has been exposed to tobacco smoke. Katherine Delgado has never used smokeless tobacco. Katherine Delgado reports that Katherine Delgado does not drink alcohol  and does not use drugs. Family History:  Family  History  Problem Relation Age of Onset   Alzheimer's disease Mother    Heart disease Mother    Heart disease Father    Liver disease Father    Cancer Brother    Colon polyps Brother    Arthritis Son    Colon cancer Neg Hx    Esophageal cancer Neg Hx    Kidney disease Neg Hx    Stomach cancer Neg Hx    Rectal cancer Neg Hx      HOME MEDICATIONS: Allergies as of 09/26/2024       Reactions   Influenza Vaccines Other (See Comments)   h/o Guillain Barre; dr's told me years ago never to take another flu shot as it could relapse Julee Birch; has to do with when vaccine being changed to H1N1 virus   Penicillins Hives, Itching, Swelling, Rash   Has Katherine Delgado had a PCN reaction causing immediate rash, facial/tongue/throat swelling,  SOB or lightheadedness with hypotension:Unknown Has Katherine Delgado had a PCN reaction causing severe rash involving mucus membranes or skin necrosis: Unknown Has Katherine Delgado had a PCN reaction that required hospitalization:Unknown Has Katherine Delgado had a PCN reaction occurring within the last 10 years: Unknown If all of the above answers are NO, then may proceed with Cephalosporin use.   Statins Other (See Comments)   Myopathy, transaminitis   Sulfamethoxazole -trimethoprim  Itching        Medication List        Accurate as of September 26, 2024  7:00 AM. If you have any questions, ask your nurse or doctor.          amitriptyline  50 MG tablet Commonly known as: ELAVIL  Take 1 tablet (50 mg total) by mouth at bedtime.   amLODipine  10 MG tablet Commonly known as: NORVASC  TAKE 1 TABLET(10 MG) BY MOUTH DAILY   artificial tears ophthalmic solution Place 1 drop into both eyes 4 (four) times daily as needed (for dry/irritated eyes).   aspirin EC 81 MG tablet Take 81 mg by mouth 2 (two) times a week.   augmented betamethasone  dipropionate 0.05 % cream Commonly known as: DIPROLENE -AF Apply topically 2 (two) times daily as needed.   B-D UF III MINI PEN NEEDLES 31G  X 5 MM Misc Generic drug: Insulin  Pen Needle 1 Device by Other route in the morning, at noon, in the evening, and at bedtime.   cholecalciferol 25 MCG (1000 UNIT) tablet Commonly known as: VITAMIN D3 Take 2,000 Units by mouth daily.   Cranberry 475 MG Caps Take 1 capsule (475 mg total) by mouth 2 (two) times daily.   Dexcom G7 Sensor Misc 1 Device by Does not apply route as directed.   docusate sodium  100 MG capsule Commonly known as: COLACE Take 100 mg by mouth daily as needed for mild constipation.   DULoxetine  20 MG capsule Commonly known as: CYMBALTA  TAKE 1 CAPSULE(20 MG) BY MOUTH DAILY   ezetimibe  10 MG tablet Commonly known as: ZETIA  Take 1 tablet (10 mg total) by mouth daily.   fluconazole  150 MG tablet Commonly known as: DIFLUCAN  Repeat in 72 hours   fluticasone  0.05 % cream Commonly known as: CUTIVATE  Apply topically 2 (two) times daily as needed.   fluticasone  50 MCG/ACT nasal spray Commonly known as: FLONASE  SHAKE LIQUID AND USE 2 SPRAYS IN EACH NOSTRIL AT BEDTIME   folic acid  1 MG tablet Commonly known as: FOLVITE  Take 2 tablets (2 mg total) by mouth daily.   furosemide  40 MG tablet Commonly known as: LASIX  Take 1 tablet (40 mg total) by mouth daily.   gabapentin  600 MG tablet Commonly known as: NEURONTIN  TAKE 1 TABLET(600 MG) BY MOUTH THREE TIMES DAILY   HumaLOG  KwikPen 200 UNIT/ML KwikPen Generic drug: insulin  lispro INJECT UP TO 30 UNITS DAILY AS DIRECTED PER MD   ICY HOT MEDICATED SPRAY EX Apply 1 application topically as needed (shoulder pain).   Iodosorb 0.9 % gel Generic drug: cadexomer iodine  Apply 1 Application topically daily as needed for wound care.   ipratropium-albuterol  0.5-2.5 (3) MG/3ML Soln Commonly known as: DUONEB Take 3 mLs by nebulization every 6 (six) hours as needed.   ketoconazole  2 % cream Commonly known as: NIZORAL  Apply 1 Application topically 2 (two) times daily.   ketoconazole  2 % shampoo Commonly known  as: NIZORAL  Apply 1 Application topically 2 (two) times a week.   levothyroxine  100 MCG tablet Commonly known as: SYNTHROID  Take 1 tablet (100 mcg total) by mouth daily  before breakfast.   lisinopril  20 MG tablet Commonly known as: ZESTRIL  TAKE 1 TABLET BY MOUTH EVERY DAY   loperamide 2 MG tablet Commonly known as: IMODIUM A-D Take 2 mg by mouth 4 (four) times daily as needed for diarrhea or loose stools.   magnesium  oxide 400 (240 Mg) MG tablet Commonly known as: MAG-OX TAKE 1 TABLET(400 MG) BY MOUTH DAILY   Melatonin 10 MG Tabs Take 10 mg by mouth at bedtime as needed (sleep).   metoprolol  tartrate 50 MG tablet Commonly known as: LOPRESSOR  TAKE 1 TABLET BY MOUTH TWICE DAILY   mirabegron  ER 50 MG Tb24 tablet Commonly known as: Myrbetriq  Take 1 tablet (50 mg total) by mouth daily.   MULTIVITAMIN ADULT PO Take 1 tablet by mouth daily.   nystatin  powder Commonly known as: MYCOSTATIN /NYSTOP  Apply 1 Application topically 3 (three) times daily.   nystatin  cream Commonly known as: MYCOSTATIN  Apply 1 Application topically 3 (three) times daily. To rashes under bilateral breasts and bilateral groin   omega-3 acid ethyl esters 1 g capsule Commonly known as: LOVAZA  TAKE ONE CAPSULE BY MOUTH EVERY DAY   ondansetron  4 MG tablet Commonly known as: Zofran  Take 1 tablet (4 mg total) by mouth every 8 (eight) hours as needed for nausea or vomiting.   oxyCODONE -acetaminophen  10-325 MG tablet Commonly known as: Percocet Take 1 tablet by mouth every 4 (four) hours as needed for pain.   pantoprazole  40 MG tablet Commonly known as: PROTONIX  Take 1 tablet (40 mg total) by mouth daily.   potassium chloride  SA 20 MEQ tablet Commonly known as: KLOR-CON  M Take 2 tablets (40 mEq total) by mouth daily.   promethazine  12.5 MG tablet Commonly known as: PHENERGAN  as needed.   Semaglutide  (2 MG/DOSE) 8 MG/3ML Sopn Inject 2 mg as directed once a week.   sodium chloride  0.65 % nasal  spray Commonly known as: OCEAN Place 2 sprays into the nose as needed for congestion.   tiotropium 18 MCG inhalation capsule Commonly known as: SPIRIVA  Place 18 mcg into inhaler and inhale as needed.   tiZANidine  2 MG tablet Commonly known as: ZANAFLEX  TAKE 1 TABLET(2 MG) BY MOUTH THREE TIMES DAILY   Toujeo  SoloStar 300 UNIT/ML Solostar Pen Generic drug: insulin  glargine (1 Unit Dial) 36 units QAM and 36 units  QPM   triamterene -hydrochlorothiazide  37.5-25 MG tablet Commonly known as: MAXZIDE -25 TAKE 1 TABLET BY MOUTH DAILY   venlafaxine  XR 75 MG 24 hr capsule Commonly known as: EFFEXOR -XR TAKE 1 CAPSULE BY MOUTH EVERY DAY WITH BREAKFAST   Xeljanz  5 MG Tabs Generic drug: Tofacitinib  Citrate TAKE 1 TABLET BY MOUTH DAILY         OBJECTIVE:   Vital Signs: There were no vitals taken for this visit.  Wt Readings from Last 3 Encounters:  08/29/24 216 lb 9.6 oz (98.2 kg)  05/24/24 212 lb 9.6 oz (96.4 kg)  04/26/24 210 lb (95.3 kg)     Exam: General: Pt appears well and is in NAD  Lungs: Clear with good BS bilat   Heart: RRR   Extremities: 1+ pretibial edema.  Katherine Delgado has an infection of the right second toe, with erythema and swelling  Neuro: MS is good with appropriate affect, pt is alert and Ox3    DM foot exam:1/116/2025 per podiatry   DATA REVIEWED:  Lab Results  Component Value Date   HGBA1C 7.1 (A) 04/02/2024   HGBA1C 6.2 (A) 10/04/2023   HGBA1C 6.8 (A) 04/03/2023    Latest Reference  Range & Units 04/02/24 11:01  TSH 0.40 - 4.50 mIU/L 2.37     Latest Reference Range & Units 08/20/24 15:37  TCO2 22 - 32 mmol/L 24  Sodium 135 - 145 mmol/L 137  Potassium 3.5 - 5.1 mmol/L 3.9  Chloride 98 - 111 mmol/L 100  Glucose 70 - 99 mg/dL 834 (H)  BUN 8 - 23 mg/dL 27 (H)  Creatinine 9.55 - 1.00 mg/dL 8.79 (H)  Calcium  Ionized 1.15 - 1.40 mmol/L 1.09 (L)  (H): Data is abnormally high (L): Data is abnormally low   ASSESSMENT / PLAN / RECOMMENDATIONS:    1) Type 2 Diabetes Mellitus, With  Neuropathic complications - Most recent A1c of 7.1 %. Goal A1c < 7.5 %.    -A1c optimal -Intolerant to SGLT-2 inhibitors due to severe genital irritations  - Discussed the portance of low-carb diet, as the Katherine Delgado does admit to some dietary indiscretions lately - I will change her sensitivity factor from 35 to 40, as Katherine Delgado has been noted with hypoglycemia after a bolus  MEDICATIONS:  -Continue Ozempic  2 mg weekly  - Continue Toujeo  36 units BID - Continue   Humalog  5 units with each meal  - Change CF : Humalog  ( BG - 130/40 )    EDUCATION / INSTRUCTIONS: BG monitoring instructions: Katherine Delgado is instructed to check her blood sugars 3 times a day, before meals . Call [facility] if: BG persistently < 70  I reviewed the Rule of 15 for the treatment of hypoglycemia in detail with the Katherine Delgado. Literature supplied.   2) Diabetic complications:  Eye: Does not have known diabetic retinopathy.  Neuro/ Feet: Does  have known diabetic peripheral neuropathy .  Renal: Katherine Delgado does not have known baseline CKD.    3) Hypothyroidism:   -Katherine Delgado with multiple nonspecific symptoms -TSH remains within normal range, no change  Medication  levothyroxine  100 mcg daily     F/U in 6 months     Signed electronically by: Stefano Redgie Butts, MD  [facility] End[facility]  [facility] [address] [place] [place], [place] [address] Phone: [phone] FAX: [phone]   CC: Leonarda Roxan BROCKS, NP [address] [place] [address] Phone: [phone]  Fax: [phone]  Return to Endocrinology clinic as below: Future Appointments  Date Time Provider Department Center  09/26/2024 12:30 PM Magnus Crescenzo, Donell Redgie, MD [id number] None  10/08/2024 10:40 AM Debby Fidela CROME, NP [facility] CPR  10/14/2024  8:45 AM [facility] [address]  10/18/2024 10:00 AM Ngetich, Roxan BROCKS, NP [facility] [address]  10/31/2024   9:50 AM Cheryl Waddell HERO, PA-C [facility] None    "

## 2024-09-27 ENCOUNTER — Other Ambulatory Visit: Payer: Self-pay | Admitting: Internal Medicine

## 2024-10-08 ENCOUNTER — Encounter: Admitting: Registered Nurse

## 2024-10-14 ENCOUNTER — Other Ambulatory Visit: Payer: Self-pay

## 2024-10-18 ENCOUNTER — Ambulatory Visit: Payer: Self-pay | Admitting: Family

## 2024-10-31 ENCOUNTER — Ambulatory Visit: Admitting: Physician Assistant

## 2024-12-03 ENCOUNTER — Ambulatory Visit: Admitting: Internal Medicine
